# Patient Record
Sex: Female | Born: 1940 | Race: Black or African American | Hispanic: No | Smoking: Never smoker
Health system: Southern US, Community
[De-identification: ages and names within clinical notes are randomized; demographics above are authoritative.]

## PROBLEM LIST (undated history)

## (undated) ENCOUNTER — Emergency Department (HOSPITAL_COMMUNITY): Admission: EM | Payer: Medicare Other | Source: Home / Self Care

## (undated) DIAGNOSIS — K219 Gastro-esophageal reflux disease without esophagitis: Secondary | ICD-10-CM

## (undated) DIAGNOSIS — I639 Cerebral infarction, unspecified: Secondary | ICD-10-CM

## (undated) DIAGNOSIS — N189 Chronic kidney disease, unspecified: Secondary | ICD-10-CM

## (undated) DIAGNOSIS — E1122 Type 2 diabetes mellitus with diabetic chronic kidney disease: Secondary | ICD-10-CM

## (undated) DIAGNOSIS — I10 Essential (primary) hypertension: Secondary | ICD-10-CM

## (undated) DIAGNOSIS — Z992 Dependence on renal dialysis: Secondary | ICD-10-CM

## (undated) DIAGNOSIS — R0602 Shortness of breath: Secondary | ICD-10-CM

## (undated) DIAGNOSIS — N289 Disorder of kidney and ureter, unspecified: Secondary | ICD-10-CM

## (undated) DIAGNOSIS — E119 Type 2 diabetes mellitus without complications: Secondary | ICD-10-CM

## (undated) DIAGNOSIS — L988 Other specified disorders of the skin and subcutaneous tissue: Secondary | ICD-10-CM

## (undated) DIAGNOSIS — D649 Anemia, unspecified: Secondary | ICD-10-CM

## (undated) DIAGNOSIS — N186 End stage renal disease: Secondary | ICD-10-CM

## (undated) HISTORY — DX: Chronic kidney disease, unspecified: N18.9

## (undated) HISTORY — DX: Type 2 diabetes mellitus with diabetic chronic kidney disease: N18.6

## (undated) HISTORY — DX: Type 2 diabetes mellitus with diabetic chronic kidney disease: E11.22

---

## 2001-07-16 ENCOUNTER — Ambulatory Visit (HOSPITAL_COMMUNITY): Admission: RE | Admit: 2001-07-16 | Discharge: 2001-07-16 | Payer: Self-pay | Admitting: Family Medicine

## 2001-07-16 ENCOUNTER — Encounter: Payer: Self-pay | Admitting: Family Medicine

## 2002-01-14 ENCOUNTER — Ambulatory Visit (HOSPITAL_COMMUNITY): Admission: RE | Admit: 2002-01-14 | Discharge: 2002-01-14 | Payer: Self-pay | Admitting: Family Medicine

## 2002-01-14 ENCOUNTER — Encounter: Payer: Self-pay | Admitting: Family Medicine

## 2002-12-28 ENCOUNTER — Ambulatory Visit (HOSPITAL_COMMUNITY): Admission: RE | Admit: 2002-12-28 | Discharge: 2002-12-28 | Payer: Self-pay | Admitting: Family Medicine

## 2002-12-28 ENCOUNTER — Encounter: Payer: Self-pay | Admitting: Family Medicine

## 2004-10-14 ENCOUNTER — Ambulatory Visit (HOSPITAL_COMMUNITY): Admission: RE | Admit: 2004-10-14 | Discharge: 2004-10-14 | Payer: Self-pay | Admitting: Family Medicine

## 2005-06-16 ENCOUNTER — Encounter (INDEPENDENT_AMBULATORY_CARE_PROVIDER_SITE_OTHER): Payer: Self-pay | Admitting: Internal Medicine

## 2005-07-01 ENCOUNTER — Encounter (INDEPENDENT_AMBULATORY_CARE_PROVIDER_SITE_OTHER): Payer: Self-pay | Admitting: Internal Medicine

## 2005-07-09 ENCOUNTER — Encounter (INDEPENDENT_AMBULATORY_CARE_PROVIDER_SITE_OTHER): Payer: Self-pay | Admitting: Internal Medicine

## 2005-09-14 ENCOUNTER — Encounter (INDEPENDENT_AMBULATORY_CARE_PROVIDER_SITE_OTHER): Payer: Self-pay | Admitting: Internal Medicine

## 2005-09-14 LAB — CONVERTED CEMR LAB: Hgb A1c MFr Bld: 10 %

## 2005-10-01 ENCOUNTER — Encounter (INDEPENDENT_AMBULATORY_CARE_PROVIDER_SITE_OTHER): Payer: Self-pay | Admitting: Internal Medicine

## 2005-10-16 ENCOUNTER — Ambulatory Visit (HOSPITAL_COMMUNITY): Admission: RE | Admit: 2005-10-16 | Discharge: 2005-10-16 | Payer: Self-pay | Admitting: Family Medicine

## 2005-11-06 ENCOUNTER — Ambulatory Visit (HOSPITAL_COMMUNITY): Admission: RE | Admit: 2005-11-06 | Discharge: 2005-11-06 | Payer: Self-pay | Admitting: General Surgery

## 2005-11-06 ENCOUNTER — Encounter (INDEPENDENT_AMBULATORY_CARE_PROVIDER_SITE_OTHER): Payer: Self-pay | Admitting: Internal Medicine

## 2005-11-12 ENCOUNTER — Encounter (INDEPENDENT_AMBULATORY_CARE_PROVIDER_SITE_OTHER): Payer: Self-pay | Admitting: Internal Medicine

## 2006-01-14 ENCOUNTER — Encounter (INDEPENDENT_AMBULATORY_CARE_PROVIDER_SITE_OTHER): Payer: Self-pay | Admitting: Internal Medicine

## 2006-01-14 LAB — CONVERTED CEMR LAB: Hgb A1c MFr Bld: 9.7 %

## 2006-01-29 ENCOUNTER — Ambulatory Visit: Payer: Self-pay | Admitting: Internal Medicine

## 2006-02-12 ENCOUNTER — Ambulatory Visit: Payer: Self-pay | Admitting: Internal Medicine

## 2006-02-12 LAB — CONVERTED CEMR LAB
AST: 15 units/L
Alkaline Phosphatase: 60 units/L
BUN: 14 mg/dL
Creatinine, Ser: 0.91 mg/dL
Glucose, Bld: 152 mg/dL
Sodium: 143 meq/L
Total Bilirubin: 0.5 mg/dL
Total CHOL/HDL Ratio: 3.9
Total Protein: 7.3 g/dL
VLDL: 30 mg/dL

## 2006-02-13 ENCOUNTER — Encounter (INDEPENDENT_AMBULATORY_CARE_PROVIDER_SITE_OTHER): Payer: Self-pay | Admitting: Internal Medicine

## 2006-02-17 ENCOUNTER — Telehealth (INDEPENDENT_AMBULATORY_CARE_PROVIDER_SITE_OTHER): Payer: Self-pay | Admitting: Internal Medicine

## 2006-02-18 ENCOUNTER — Ambulatory Visit (HOSPITAL_COMMUNITY): Admission: RE | Admit: 2006-02-18 | Discharge: 2006-02-18 | Payer: Self-pay | Admitting: Internal Medicine

## 2006-02-25 ENCOUNTER — Ambulatory Visit: Payer: Self-pay | Admitting: Internal Medicine

## 2006-02-25 LAB — CONVERTED CEMR LAB: Vitamin B-12: 376 pg/mL

## 2006-02-26 ENCOUNTER — Encounter (INDEPENDENT_AMBULATORY_CARE_PROVIDER_SITE_OTHER): Payer: Self-pay | Admitting: Internal Medicine

## 2006-03-18 ENCOUNTER — Ambulatory Visit: Payer: Self-pay | Admitting: Internal Medicine

## 2006-03-20 ENCOUNTER — Encounter (INDEPENDENT_AMBULATORY_CARE_PROVIDER_SITE_OTHER): Payer: Self-pay | Admitting: Internal Medicine

## 2006-04-16 ENCOUNTER — Encounter (INDEPENDENT_AMBULATORY_CARE_PROVIDER_SITE_OTHER): Payer: Self-pay | Admitting: Internal Medicine

## 2006-04-29 ENCOUNTER — Ambulatory Visit: Payer: Self-pay | Admitting: Internal Medicine

## 2006-05-15 ENCOUNTER — Encounter (INDEPENDENT_AMBULATORY_CARE_PROVIDER_SITE_OTHER): Payer: Self-pay | Admitting: Internal Medicine

## 2006-05-20 ENCOUNTER — Encounter: Payer: Self-pay | Admitting: Internal Medicine

## 2006-05-20 DIAGNOSIS — E119 Type 2 diabetes mellitus without complications: Secondary | ICD-10-CM | POA: Insufficient documentation

## 2006-05-20 DIAGNOSIS — G2581 Restless legs syndrome: Secondary | ICD-10-CM | POA: Insufficient documentation

## 2006-05-20 DIAGNOSIS — G609 Hereditary and idiopathic neuropathy, unspecified: Secondary | ICD-10-CM | POA: Insufficient documentation

## 2006-05-20 DIAGNOSIS — E785 Hyperlipidemia, unspecified: Secondary | ICD-10-CM

## 2006-05-20 DIAGNOSIS — I10 Essential (primary) hypertension: Secondary | ICD-10-CM | POA: Insufficient documentation

## 2006-10-15 ENCOUNTER — Encounter (INDEPENDENT_AMBULATORY_CARE_PROVIDER_SITE_OTHER): Payer: Self-pay | Admitting: Internal Medicine

## 2006-10-16 ENCOUNTER — Ambulatory Visit: Payer: Self-pay | Admitting: Internal Medicine

## 2006-10-16 DIAGNOSIS — I498 Other specified cardiac arrhythmias: Secondary | ICD-10-CM | POA: Insufficient documentation

## 2006-10-16 LAB — CONVERTED CEMR LAB
Blood Glucose, Fingerstick: 111
Hgb A1c MFr Bld: >14 %

## 2006-10-19 ENCOUNTER — Encounter (INDEPENDENT_AMBULATORY_CARE_PROVIDER_SITE_OTHER): Payer: Self-pay | Admitting: Internal Medicine

## 2006-10-20 LAB — CONVERTED CEMR LAB
Albumin: 3.7 g/dL (ref 3.5–5.2)
Alkaline Phosphatase: 66 units/L (ref 39–117)
Basophils Absolute: 0 10*3/uL (ref 0.0–0.1)
Basophils Relative: 0 % (ref 0–1)
Calcium: 8.9 mg/dL (ref 8.4–10.5)
Creatinine, Ser: 1 mg/dL (ref 0.40–1.20)
Eosinophils Absolute: 0.1 10*3/uL (ref 0.0–0.7)
Eosinophils Relative: 1 % (ref 0–5)
Glucose, Bld: 57 mg/dL — ABNORMAL LOW (ref 70–99)
HCT: 36.4 % (ref 36.0–46.0)
HDL: 45 mg/dL (ref >39)
Hgb A1c MFr Bld: 13.7 % — ABNORMAL HIGH (ref 4.6–6.1)
LDL Cholesterol: 149 mg/dL — ABNORMAL HIGH (ref 0–99)
Lymphocytes Relative: 26 % (ref 12–46)
Lymphs Abs: 3.7 10*3/uL — ABNORMAL HIGH (ref 0.7–3.3)
Monocytes Absolute: 1.5 10*3/uL — ABNORMAL HIGH (ref 0.2–0.7)
Neutrophils Relative %: 63 % (ref 43–77)
Potassium: 3.6 meq/L (ref 3.5–5.3)
RDW: 14 % (ref 11.5–14.0)
TSH: 1.48 microintl units/mL (ref 0.350–5.50)
Total CHOL/HDL Ratio: 5
Total Protein: 7.2 g/dL (ref 6.0–8.3)
Triglycerides: 151 mg/dL — ABNORMAL HIGH (ref <150)

## 2006-11-19 ENCOUNTER — Ambulatory Visit: Payer: Self-pay | Admitting: Internal Medicine

## 2006-11-19 DIAGNOSIS — R609 Edema, unspecified: Secondary | ICD-10-CM | POA: Insufficient documentation

## 2006-11-19 DIAGNOSIS — D72829 Elevated white blood cell count, unspecified: Secondary | ICD-10-CM | POA: Insufficient documentation

## 2006-11-19 DIAGNOSIS — E1149 Type 2 diabetes mellitus with other diabetic neurological complication: Secondary | ICD-10-CM | POA: Insufficient documentation

## 2006-12-10 ENCOUNTER — Encounter (INDEPENDENT_AMBULATORY_CARE_PROVIDER_SITE_OTHER): Payer: Self-pay | Admitting: Internal Medicine

## 2007-01-05 ENCOUNTER — Ambulatory Visit (HOSPITAL_COMMUNITY): Admission: RE | Admit: 2007-01-05 | Discharge: 2007-01-05 | Payer: Self-pay | Admitting: Family Medicine

## 2007-09-10 ENCOUNTER — Encounter (INDEPENDENT_AMBULATORY_CARE_PROVIDER_SITE_OTHER): Payer: Self-pay | Admitting: Internal Medicine

## 2007-10-15 ENCOUNTER — Ambulatory Visit (HOSPITAL_COMMUNITY): Admission: RE | Admit: 2007-10-15 | Discharge: 2007-10-15 | Payer: Self-pay | Admitting: Family Medicine

## 2008-06-13 ENCOUNTER — Encounter (INDEPENDENT_AMBULATORY_CARE_PROVIDER_SITE_OTHER): Payer: Self-pay | Admitting: Internal Medicine

## 2008-06-14 ENCOUNTER — Encounter (INDEPENDENT_AMBULATORY_CARE_PROVIDER_SITE_OTHER): Payer: Self-pay | Admitting: Internal Medicine

## 2009-01-24 ENCOUNTER — Inpatient Hospital Stay (HOSPITAL_COMMUNITY): Admission: EM | Admit: 2009-01-24 | Discharge: 2009-01-27 | Payer: Self-pay | Admitting: Emergency Medicine

## 2009-03-17 ENCOUNTER — Emergency Department (HOSPITAL_COMMUNITY): Admission: EM | Admit: 2009-03-17 | Discharge: 2009-03-18 | Payer: Self-pay | Admitting: Emergency Medicine

## 2009-08-02 ENCOUNTER — Ambulatory Visit (HOSPITAL_COMMUNITY): Admission: RE | Admit: 2009-08-02 | Discharge: 2009-08-02 | Payer: Self-pay | Admitting: Family Medicine

## 2009-08-16 ENCOUNTER — Emergency Department (HOSPITAL_COMMUNITY): Admission: EM | Admit: 2009-08-16 | Discharge: 2009-08-16 | Payer: Self-pay | Admitting: Emergency Medicine

## 2009-11-03 ENCOUNTER — Emergency Department (HOSPITAL_COMMUNITY): Admission: EM | Admit: 2009-11-03 | Discharge: 2009-11-04 | Payer: Self-pay | Admitting: Emergency Medicine

## 2009-11-15 ENCOUNTER — Encounter: Admission: RE | Admit: 2009-11-15 | Discharge: 2009-11-15 | Payer: Self-pay | Admitting: Neurology

## 2010-07-07 ENCOUNTER — Encounter: Payer: Self-pay | Admitting: Family Medicine

## 2010-07-14 LAB — CONVERTED CEMR LAB: TSH: 1.821 microintl units/mL

## 2010-08-20 ENCOUNTER — Other Ambulatory Visit (HOSPITAL_COMMUNITY): Payer: Self-pay | Admitting: Family Medicine

## 2010-08-20 DIAGNOSIS — Z139 Encounter for screening, unspecified: Secondary | ICD-10-CM

## 2010-08-22 ENCOUNTER — Ambulatory Visit (HOSPITAL_COMMUNITY)
Admission: RE | Admit: 2010-08-22 | Discharge: 2010-08-22 | Disposition: A | Discharge status: Home / Self Care | Payer: Medicare Other | Source: Ambulatory Visit | Attending: Family Medicine | Admitting: Family Medicine

## 2010-08-22 DIAGNOSIS — Z139 Encounter for screening, unspecified: Secondary | ICD-10-CM

## 2010-08-22 DIAGNOSIS — Z1231 Encounter for screening mammogram for malignant neoplasm of breast: Secondary | ICD-10-CM | POA: Insufficient documentation

## 2010-09-02 LAB — DIFFERENTIAL
Basophils Absolute: 0 10*3/uL (ref 0.0–0.1)
Basophils Relative: 0 % (ref 0–1)
Eosinophils Absolute: 0.1 10*3/uL (ref 0.0–0.7)
Neutro Abs: 9.9 10*3/uL — ABNORMAL HIGH (ref 1.7–7.7)
Neutrophils Relative %: 72 % (ref 43–77)

## 2010-09-02 LAB — POCT I-STAT, CHEM 8
Chloride: 107 mEq/L (ref 96–112)
HCT: 37 % (ref 36.0–46.0)
Hemoglobin: 12.6 g/dL (ref 12.0–15.0)
Potassium: 3.4 mEq/L — ABNORMAL LOW (ref 3.5–5.1)

## 2010-09-02 LAB — GLUCOSE, CAPILLARY: Glucose-Capillary: 143 mg/dL — ABNORMAL HIGH (ref 70–99)

## 2010-09-02 LAB — CBC
MCHC: 34 g/dL (ref 30.0–36.0)
Platelets: 189 10*3/uL (ref 150–400)
RDW: 12.6 % (ref 11.5–15.5)

## 2010-09-02 LAB — CK: Total CK: 210 U/L — ABNORMAL HIGH (ref 7–177)

## 2010-09-09 LAB — POCT CARDIAC MARKERS
CKMB, poc: 2.1 ng/mL (ref 1.0–8.0)
Troponin i, poc: <0.05 ng/mL (ref 0.00–0.09)

## 2010-09-09 LAB — BASIC METABOLIC PANEL
BUN: 21 mg/dL (ref 6–23)
Calcium: 9.5 mg/dL (ref 8.4–10.5)
GFR calc non Af Amer: 39 mL/min — ABNORMAL LOW (ref >60)
Glucose, Bld: 112 mg/dL — ABNORMAL HIGH (ref 70–99)
Potassium: 3.6 mEq/L (ref 3.5–5.1)
Sodium: 140 mEq/L (ref 135–145)

## 2010-09-09 LAB — DIFFERENTIAL
Basophils Absolute: 0 10*3/uL (ref 0.0–0.1)
Eosinophils Relative: 0 % (ref 0–5)
Lymphocytes Relative: 14 % (ref 12–46)
Lymphs Abs: 2 10*3/uL (ref 0.7–4.0)
Neutro Abs: 12.1 10*3/uL — ABNORMAL HIGH (ref 1.7–7.7)

## 2010-09-09 LAB — CBC
HCT: 34.9 % — ABNORMAL LOW (ref 36.0–46.0)
Hemoglobin: 11.7 g/dL — ABNORMAL LOW (ref 12.0–15.0)
Platelets: 212 10*3/uL (ref 150–400)
RDW: 13.2 % (ref 11.5–15.5)
WBC: 15.1 10*3/uL — ABNORMAL HIGH (ref 4.0–10.5)

## 2010-09-09 LAB — GLUCOSE, CAPILLARY: Glucose-Capillary: 101 mg/dL — ABNORMAL HIGH (ref 70–99)

## 2010-09-19 LAB — CBC
HCT: 33.2 % — ABNORMAL LOW (ref 36.0–46.0)
Hemoglobin: 11 g/dL — ABNORMAL LOW (ref 12.0–15.0)
MCHC: 33.2 g/dL (ref 30.0–36.0)
MCV: 90.1 fL (ref 78.0–100.0)
Platelets: 210 10*3/uL (ref 150–400)
RBC: 3.68 MIL/uL — ABNORMAL LOW (ref 3.87–5.11)
RDW: 13.6 % (ref 11.5–15.5)
WBC: 11.7 10*3/uL — ABNORMAL HIGH (ref 4.0–10.5)

## 2010-09-19 LAB — BASIC METABOLIC PANEL
BUN: 19 mg/dL (ref 6–23)
CO2: 29 mEq/L (ref 19–32)
Chloride: 103 mEq/L (ref 96–112)
Creatinine, Ser: 1.4 mg/dL — ABNORMAL HIGH (ref 0.4–1.2)
Glucose, Bld: 239 mg/dL — ABNORMAL HIGH (ref 70–99)
Potassium: 3.4 mEq/L — ABNORMAL LOW (ref 3.5–5.1)

## 2010-09-19 LAB — DIFFERENTIAL
Basophils Relative: 0 % (ref 0–1)
Eosinophils Absolute: 0.2 10*3/uL (ref 0.0–0.7)
Eosinophils Relative: 1 % (ref 0–5)
Lymphs Abs: 2.4 10*3/uL (ref 0.7–4.0)
Neutrophils Relative %: 70 % (ref 43–77)

## 2010-09-19 LAB — URINE CULTURE: Colony Count: 55000

## 2010-09-19 LAB — URINALYSIS, ROUTINE W REFLEX MICROSCOPIC
Bilirubin Urine: NEGATIVE
Glucose, UA: 500 mg/dL — AB
Ketones, ur: NEGATIVE mg/dL
Leukocytes, UA: NEGATIVE
Nitrite: NEGATIVE
Protein, ur: >300 mg/dL — AB
Specific Gravity, Urine: 1.025 (ref 1.005–1.030)
Urobilinogen, UA: 0.2 mg/dL (ref 0.0–1.0)
pH: 6 (ref 5.0–8.0)

## 2010-09-19 LAB — GLUCOSE, CAPILLARY: Glucose-Capillary: 202 mg/dL — ABNORMAL HIGH (ref 70–99)

## 2010-09-19 LAB — URINE MICROSCOPIC-ADD ON

## 2010-09-22 LAB — GLUCOSE, CAPILLARY
Glucose-Capillary: 217 mg/dL — ABNORMAL HIGH (ref 70–99)
Glucose-Capillary: 224 mg/dL — ABNORMAL HIGH (ref 70–99)
Glucose-Capillary: 265 mg/dL — ABNORMAL HIGH (ref 70–99)
Glucose-Capillary: 270 mg/dL — ABNORMAL HIGH (ref 70–99)
Glucose-Capillary: 320 mg/dL — ABNORMAL HIGH (ref 70–99)
Glucose-Capillary: 363 mg/dL — ABNORMAL HIGH (ref 70–99)

## 2010-09-22 LAB — DIFFERENTIAL
Basophils Absolute: 0 10*3/uL (ref 0.0–0.1)
Basophils Absolute: 0 10*3/uL (ref 0.0–0.1)
Basophils Absolute: 0 10*3/uL (ref 0.0–0.1)
Basophils Relative: 0 % (ref 0–1)
Basophils Relative: 0 % (ref 0–1)
Basophils Relative: 0 % (ref 0–1)
Eosinophils Absolute: 0 10*3/uL (ref 0.0–0.7)
Eosinophils Absolute: 0 10*3/uL (ref 0.0–0.7)
Eosinophils Absolute: 0.1 10*3/uL (ref 0.0–0.7)
Eosinophils Relative: 0 % (ref 0–5)
Eosinophils Relative: 1 % (ref 0–5)
Lymphocytes Relative: 18 % (ref 12–46)
Lymphocytes Relative: 27 % (ref 12–46)
Lymphs Abs: 3.5 10*3/uL (ref 0.7–4.0)
Monocytes Absolute: 0.7 10*3/uL (ref 0.1–1.0)
Monocytes Relative: 2 % — ABNORMAL LOW (ref 3–12)
Monocytes Relative: 5 % (ref 3–12)
Neutro Abs: 11.6 10*3/uL — ABNORMAL HIGH (ref 1.7–7.7)
Neutro Abs: 12.4 10*3/uL — ABNORMAL HIGH (ref 1.7–7.7)
Neutro Abs: 8.4 10*3/uL — ABNORMAL HIGH (ref 1.7–7.7)
Neutrophils Relative %: 66 % (ref 43–77)
Neutrophils Relative %: 82 % — ABNORMAL HIGH (ref 43–77)
Neutrophils Relative %: 90 % — ABNORMAL HIGH (ref 43–77)

## 2010-09-22 LAB — MAGNESIUM: Magnesium: 1.7 mg/dL (ref 1.5–2.5)

## 2010-09-22 LAB — URINE MICROSCOPIC-ADD ON

## 2010-09-22 LAB — CBC
Hemoglobin: 10.8 g/dL — ABNORMAL LOW (ref 12.0–15.0)
Hemoglobin: 11.2 g/dL — ABNORMAL LOW (ref 12.0–15.0)
MCHC: 34.1 g/dL (ref 30.0–36.0)
MCHC: 34.2 g/dL (ref 30.0–36.0)
MCV: 89 fL (ref 78.0–100.0)
MCV: 89.7 fL (ref 78.0–100.0)
Platelets: 191 10*3/uL (ref 150–400)
Platelets: 197 10*3/uL (ref 150–400)
RBC: 3.57 MIL/uL — ABNORMAL LOW (ref 3.87–5.11)
RDW: 13.8 % (ref 11.5–15.5)
RDW: 13.9 % (ref 11.5–15.5)
RDW: 14.4 % (ref 11.5–15.5)
WBC: 12.8 10*3/uL — ABNORMAL HIGH (ref 4.0–10.5)

## 2010-09-22 LAB — URINALYSIS, ROUTINE W REFLEX MICROSCOPIC
Bilirubin Urine: NEGATIVE
Glucose, UA: >1000 mg/dL — AB
Ketones, ur: NEGATIVE mg/dL
Leukocytes, UA: NEGATIVE
pH: 6 (ref 5.0–8.0)

## 2010-09-22 LAB — LIPID PANEL: Cholesterol: 166 mg/dL (ref 0–200)

## 2010-09-22 LAB — COMPREHENSIVE METABOLIC PANEL
ALT: 13 U/L (ref 0–35)
ALT: 15 U/L (ref 0–35)
AST: 17 U/L (ref 0–37)
AST: 25 U/L (ref 0–37)
Albumin: 3.2 g/dL — ABNORMAL LOW (ref 3.5–5.2)
Alkaline Phosphatase: 66 U/L (ref 39–117)
Alkaline Phosphatase: 66 U/L (ref 39–117)
BUN: 17 mg/dL (ref 6–23)
CO2: 22 mEq/L (ref 19–32)
CO2: 24 mEq/L (ref 19–32)
Calcium: 8.9 mg/dL (ref 8.4–10.5)
Chloride: 109 mEq/L (ref 96–112)
Creatinine, Ser: 0.98 mg/dL (ref 0.4–1.2)
GFR calc Af Amer: 51 mL/min — ABNORMAL LOW (ref >60)
GFR calc Af Amer: >60 mL/min (ref >60)
GFR calc non Af Amer: 56 mL/min — ABNORMAL LOW (ref >60)
Glucose, Bld: 298 mg/dL — ABNORMAL HIGH (ref 70–99)
Glucose, Bld: 412 mg/dL — ABNORMAL HIGH (ref 70–99)
Potassium: 3.2 mEq/L — ABNORMAL LOW (ref 3.5–5.1)
Potassium: 5.1 mEq/L (ref 3.5–5.1)
Sodium: 140 mEq/L (ref 135–145)
Sodium: 140 mEq/L (ref 135–145)
Total Bilirubin: 0.6 mg/dL (ref 0.3–1.2)
Total Protein: 6.1 g/dL (ref 6.0–8.3)
Total Protein: 7.3 g/dL (ref 6.0–8.3)

## 2010-09-22 LAB — BASIC METABOLIC PANEL
BUN: 15 mg/dL (ref 6–23)
BUN: 20 mg/dL (ref 6–23)
CO2: 20 mEq/L (ref 19–32)
Calcium: 8.3 mg/dL — ABNORMAL LOW (ref 8.4–10.5)
Calcium: 8.4 mg/dL (ref 8.4–10.5)
Chloride: 112 mEq/L (ref 96–112)
Creatinine, Ser: 1.1 mg/dL (ref 0.4–1.2)
GFR calc non Af Amer: 47 mL/min — ABNORMAL LOW (ref >60)
Glucose, Bld: 288 mg/dL — ABNORMAL HIGH (ref 70–99)
Glucose, Bld: 313 mg/dL — ABNORMAL HIGH (ref 70–99)
Sodium: 139 mEq/L (ref 135–145)

## 2010-09-22 LAB — HEMOGLOBIN A1C
Hgb A1c MFr Bld: 12.3 % — ABNORMAL HIGH (ref 4.6–6.1)
Mean Plasma Glucose: 306 mg/dL

## 2010-10-12 ENCOUNTER — Emergency Department (HOSPITAL_COMMUNITY)
Admission: EM | Admit: 2010-10-12 | Discharge: 2010-10-12 | Disposition: A | Discharge status: Home / Self Care | Payer: Medicare Other | Attending: Emergency Medicine | Admitting: Emergency Medicine

## 2010-10-12 DIAGNOSIS — E785 Hyperlipidemia, unspecified: Secondary | ICD-10-CM | POA: Insufficient documentation

## 2010-10-12 DIAGNOSIS — I10 Essential (primary) hypertension: Secondary | ICD-10-CM | POA: Insufficient documentation

## 2010-10-12 DIAGNOSIS — R110 Nausea: Secondary | ICD-10-CM | POA: Insufficient documentation

## 2010-10-12 DIAGNOSIS — R42 Dizziness and giddiness: Secondary | ICD-10-CM | POA: Insufficient documentation

## 2010-10-12 DIAGNOSIS — E1169 Type 2 diabetes mellitus with other specified complication: Secondary | ICD-10-CM | POA: Insufficient documentation

## 2010-10-12 LAB — CBC
HCT: 32.6 % — ABNORMAL LOW (ref 36.0–46.0)
Hemoglobin: 10.7 g/dL — ABNORMAL LOW (ref 12.0–15.0)
MCHC: 32.8 g/dL (ref 30.0–36.0)

## 2010-10-12 LAB — BASIC METABOLIC PANEL
BUN: 25 mg/dL — ABNORMAL HIGH (ref 6–23)
CO2: 26 mEq/L (ref 19–32)
Calcium: 9.4 mg/dL (ref 8.4–10.5)
Creatinine, Ser: 1.77 mg/dL — ABNORMAL HIGH (ref 0.4–1.2)
Glucose, Bld: 350 mg/dL — ABNORMAL HIGH (ref 70–99)

## 2010-10-12 LAB — DIFFERENTIAL
Basophils Absolute: 0 10*3/uL (ref 0.0–0.1)
Lymphocytes Relative: 17 % (ref 12–46)
Monocytes Absolute: 1 10*3/uL (ref 0.1–1.0)
Monocytes Relative: 7 % (ref 3–12)
Neutro Abs: 11.9 10*3/uL — ABNORMAL HIGH (ref 1.7–7.7)

## 2010-10-29 NOTE — H&P (Signed)
[name], [name]            ACCOUNT NO.:  [id number]   MEDICAL RECORD NO.:  [id number]          PATIENT TYPE:  INP   LOCATION:  A307                          FACILITY:  [facility]   PHYSICIAN:  Corinna L. Conley Canal, MDDATE OF BIRTH:  07/28/1940   DATE OF ADMISSION:  01/24/2009  DATE OF DISCHARGE:  LH                              HISTORY & PHYSICAL   CHIEF COMPLAINT:  Dizziness, vomiting.   HISTORY OF PRESENT ILLNESS:  [name] is a pleasant 70 year old  black female patient of Dr. Berdine Howard with multiple medical problems, who  woke up this morning with severe dizziness, nausea, ataxia.  She fell.  She has never experienced this before.  She has no tinnitus.  She has no  other symptoms.  She has received meclizine, several doses of Zofran,  Ativan, Reglan, morphine. She feels a bit better, but is not back to her  baseline.  She had a CT scan of the head, abdomen, and pelvis which  showed nothing other than cholelithiasis without cholecystitis.  She has  had no history of stroke.  She has a history of uncontrolled diabetes.  She drank a few sips of ginger ale and was unable to keep it down.  She  has had no hematemesis.   PAST MEDICAL HISTORY:  1. Diabetes, uncontrolled:  Blood sugars usually around 200-300 or      higher.  2. Hypertension.  3. Hyperlipidemia.   MEDICATIONS:  1. Tekturna 300 mg a day.  2. Vytorin 10/40 mg a day.  3. Norvasc 5 mg a day.  4. Diovan HCT 12.5 mg a day.  5. 70/30 insulin, 56 units in the morning and 44 units in the evening.  6. aspirin daily, but frequently forgets.   ALLERGIES:  No known drug allergies.   SOCIAL HISTORY:  The patient does not have a drinking, smoking, or drug  history.  She is here with her husband.  Her daughter assists in her  medical care.   FAMILY HISTORY:  Her sister had breast cancer.   REVIEW OF SYSTEMS:  As above, otherwise negative.   PHYSICAL EXAMINATION:  VITAL SIGNS:  Temperature is 97.5, blood pressure  174/98, pulse 83, respiratory rate 20, oxygen saturation 100% on room  air.  GENERAL:  The patient is an overweight, black female, in no acute  distress.  HEENT:  Normocephalic, atraumatic.  Pupils equal, round, and reactive to  light.  Sclerae nonicteric.  Moist mucous membranes.  NECK:  Supple.  No carotid bruits.  LUNGS:  Clear to auscultation bilaterally without wheezes, rhonchi, or  rales.  CARDIOVASCULAR:  Regular rate and rhythm without murmurs, gallops, or  rubs.  ABDOMEN:  Normal bowel sounds.  Soft, nontender, nondistended.  GU:  Deferred.  RECTAL:  Deferred.  EXTREMITIES:  No clubbing, cyanosis, or edema.  SKIN:  No rash.  NEUROLOGIC:  Alert and oriented x3.  Cranial nerves were intact.  Motor  strength is 5/5.  Deep tendon reflexes are diminished.  Babinski is  equivocal.  Finger-to-nose normal.  Did not test Romberg or gait due to  symptoms.  No nystagmus.  PSYCHIATRIC:  Normal affect.  Calm and cooperative.   LABORATORY DATA:  White blood cell count is 12,000, hemoglobin 11.6,  hematocrit 33.9, platelet count 191, normal differential.  Complete  metabolic panel significant for a potassium of 3.2, glucose 298,  otherwise unremarkable.  Urinalysis shows a specific gravity 1.025,  urine glucose greater than 1000, negative ketones, small blood, greater  than 300 protein, negative nitrate, negative leukocyte esterase, 7-10  white cells, few bacteria.  CT of the abdomen and pelvis shows  cholelithiasis without evidence of cholecystitis.  CT brain without  contrast shows nothing acute.   ASSESSMENT AND PLAN:  1. Vertigo, suspect acute labyrinthitis, but given her risk factors,      we will check an MRI to rule out posterior circulation stroke.  She      will get meclizine and antiemetics.  Consider scopolamine.  She      will get a physical therapy or occupational therapy.  Her symptoms      are somewhat improved and I will place her on 23-hour observation      on  telemetry.  2. Uncontrolled diabetes:  For now give 20 units subcutaneously, 70/30      insulin twice a day, also give sliding scale.  It sounds as if her      diabetes is uncontrolled and we may need to up-titrate her home      70/30 insulin dose.  Check hemoglobin Alc.  3. Uncontrolled hypertension:  The patient reports she has not taken      any of her medications today which I will give now.  4. Hyperlipidemia:  Hold Vytorin for now.  Check fasting lipids in the      morning.  5. Hypokalemia.  This will be repleted IV and by mouth. Check again in      the morning as well as magnesium.  6. Mild leukocytosis, suspect demargination, but check another CBC in      the morning.  7. Status post fall secondary to above:  Follow precautions.  8. Asymptomatic cholelithiasis.      Corinna L. Conley Canal, MD  Electronically Signed     CLS/MEDQ  D:  01/24/2009  T:  01/25/2009  Job:  [id number]   cc:   Barbara Folk. Berdine Addison, MD  Fax: [phone]

## 2010-10-29 NOTE — Discharge Summary (Signed)
[name], AMYX NO.:  [id number]   MEDICAL RECORD NO.:  [id number]          PATIENT TYPE:  INP   LOCATION:  A307                          FACILITY:  [facility]   PHYSICIAN:  Bonnielee Haff, MD     DATE OF BIRTH:  06/29/1940   DATE OF ADMISSION:  01/24/2009  DATE OF DISCHARGE:  08/14/2010LH                               DISCHARGE SUMMARY   The patient's PMD is Dr. [name].   No consultations obtained during this admission.   Please review H and P dictated by Dr. Doree Barthel for details  regarding the patient's presenting illness.   DISCHARGE DIAGNOSES:  1. Benign positional paroxysmal vertigo likely secondary to acute      labyrinthitis, improved.  2. Poorly-controlled diabetes requiring outpatient followup.  3. Poorly-controlled hypertension requiring outpatient followup.  4. History of dyslipidemia.   BRIEF HOSPITAL COURSE:  1. Briefly, this is a 70 year old African American female, who      presented to the hospital with dizziness and vomiting.  She was      describing these episodes as a spinning sensation of the head.  The      room was spinning around her.  The symptoms were suggestive of      vertigo.  She underwent CT scan of the abdomen and pelvis, which      did not show any acute abnormalities.  Subsequently, she underwent      MRI of the brain because of her risk factors and to rule out a      cerebellar stroke and this was negative for any acute infarcts.      Moderate white matter disease was noted.  Minimal left sphenoid      sinus disease was noted.  The patient was started on meclizine, she      was very slow to improve, we had to add Valium and steroids.  We      also started her on intravenous antibiotics for her labyrinthitis.      Over the past 36 hours, she has shown significant improvement.  She      ambulated yesterday with no difficulty and has not experienced any      more of this  spinning sensation.  2. Fall.  While she  was being evaluated by the physical therapist, the      patient had another episode of spinning and she actually fell down      on the left side of her face.  This resulted in a hematoma.  CT of      the maxillofacial area did not show any acute bony fractures.  The      hematoma was self-limiting.  3. She also has history of diabetes which is very, very poorly      controlled.  Her HBA1c came back at 12.3.  Since we gave her      steroids her sugar went up again.  We will give her one dose of      Lantus today and resume her usual dose of insulin 70/30 at home,      and with  the tapering of steroids, her blood sugar should come back      to her baseline which also appears not to be very well controlled      and so this is something she needs to discuss with her PMD.  4. Hypertension was also not optimally controlled.  Blood pressure      here 156/88, it has been fluctuating quite a bid.  She needs to      restart her home medicines and then talk about this with Dr. Berdine Addison.   Regarding her vomiting, this was secondary to the vertigo.  She did  undergo a CT abdomen and pelvis in the ED, which showed cholelithiasis  without cholecystitis and no other acute findings in the pelvis.   Otherwise, this morning, she feels well.  Denies any complaints.  No  further episodes of vertigo.  She is afebrile.  Heart rate 84,  respiratory rate 20, blood pressure 156/88, and saturation 97% on room  air.  Lungs are clear to auscultation.  Cardiovascular exam is  unremarkable.  Left side of the face shows improving swelling, which is  decreasing in size.   Labs this morning show normal potassium, normal renal function, blood  sugar is 313.  White count is high at 15, which is likely because of  steroids.  Hemoglobin is stable.  Platelet count is stable.   RECOMMENDATIONS:  1. Recommendations to the PMD include followup on cholelithiasis, keep      that in mind if she develops right upper quadrant pain  or      abdominal pain, she could be having cholecystitis.  2. To improve glycemic control by adjustment of her insulin regimen.  3. Adjust blood pressure medicines as needed.  Her blood pressure      could be high because of the steroids as well.   DISCHARGE MEDICATIONS:  1. Valium 2 mg t.i.d. for 5 days.  2. Meclizine 25 mg t.i.d. for 5 days.  3. Prednisone 20 mg daily for 3 days, followed by 10 mg daily for 3      days and then stop.  4. Levaquin 500 mg once daily for 5 days.   Continue with home medicines which include:  1. Tylenol 300 mg once daily.  2. Vytorin 10/40 once daily.  3. Norvasc 5 mg once daily.  4. Diovan HCT 160/12.5 once daily.  5. Insulin 70/30 56 units in the morning and 44 units at night.  6. Aspirin 81 mg daily.   Follow up with Dr. Berdine Addison.  She has an appointment on August 16, this  coming Monday.  Dr. Berdine Addison to consider an appointment to ENT as the  patient does have symptoms of tinnitus and hearing impairment of the  left ear.   Other instructions to the patient include no sudden head movements, no  driving till cleared by Dr. Berdine Addison, and to take it easy for the next few  days.   DIET:  Heart healthy and modified carbohydrate.   PHYSICAL ACTIVITY:  Return to activity slowly with the above  restrictions.   Total time on this encounter 35 minutes.      Bonnielee Haff, MD  Electronically Signed    GK/MEDQ  D:  01/27/2009  T:  01/27/2009  Job:  [id number]   cc:   Barrie Folk. Berdine Addison, MD  Fax: [phone]

## 2010-11-01 NOTE — H&P (Signed)
NAME:  [name], [name]            ACCOUNT NO.:  [id number]   MEDICAL RECORD NO.:  [id number]          PATIENT TYPE:  AMB   LOCATION:  DAY                           FACILITY:  [facility]   PHYSICIAN:  Leane Para C. Tamala Julian, M.D.   DATE OF BIRTH:  18-Feb-1941   DATE OF ADMISSION:  11/05/2005  DATE OF DISCHARGE:  LH                                HISTORY & PHYSICAL   HISTORY OF PRESENT ILLNESS:  A 70 year old female referred for her initial  screening colonoscopy. No history of rectal bleeding. No family history of  colon cancer or colon polyps.   PAST MEDICAL HISTORY:  Positive for diabetes mellitus, hypertension,  hyperlipidemia, and cholelithiasis.   PAST SURGICAL HISTORY:  Tubal ligation.   MEDICATIONS:  1.  Hydrochlorothiazide 12.5 mg 1/2 tablet daily.  2.  Lotrel 10/20 daily.  3.  Vytorin 10/20 daily.  4.  Actos 45 mg daily.  5.  Novolin 70/30, 50 units in the morning and 40 units in the evening.   PHYSICAL EXAMINATION:  VITAL SIGNS:  Blood pressure 150/82, pulse 92,  respiratory rate 20. Weight 187 pounds.  HEENT:  Unremarkable.  NECK:  Supple. No JVD, bruits, adenopathy, or thyromegaly.  CHEST:  Clear to auscultation.  HEART:  Regular rate and rhythm without murmur, rub, or gallop.  ABDOMEN:  Soft, nontender. No masses.  EXTREMITIES:  No clubbing, cyanosis, or edema.  NEUROLOGIC:  No focal motor, sensory, or cerebellar deficits.   IMPRESSION:  1.  Need for screening colonoscopy.  2.  Diabetes mellitus.  3.  Hypertension.  4.  Asymptomatic cholelithiasis.  5.  Hyperlipidemia.   PLAN:  Screening colonoscopy.      Vernon Prey. Tamala Julian, M.D.  Electronically Signed     LCS/MEDQ  D:  11/05/2005  T:  11/05/2005  Job:  [id number]

## 2011-09-02 ENCOUNTER — Other Ambulatory Visit (HOSPITAL_COMMUNITY): Payer: Self-pay | Admitting: Family Medicine

## 2011-09-02 DIAGNOSIS — Z139 Encounter for screening, unspecified: Secondary | ICD-10-CM

## 2011-09-04 ENCOUNTER — Ambulatory Visit (HOSPITAL_COMMUNITY)
Admission: RE | Admit: 2011-09-04 | Discharge: 2011-09-04 | Disposition: A | Discharge status: Home / Self Care | Payer: Medicare Other | Source: Ambulatory Visit | Attending: Family Medicine | Admitting: Family Medicine

## 2011-09-04 DIAGNOSIS — Z139 Encounter for screening, unspecified: Secondary | ICD-10-CM

## 2011-09-04 DIAGNOSIS — Z1231 Encounter for screening mammogram for malignant neoplasm of breast: Secondary | ICD-10-CM | POA: Insufficient documentation

## 2011-10-15 ENCOUNTER — Other Ambulatory Visit: Payer: Self-pay | Admitting: Adult Health

## 2011-10-15 ENCOUNTER — Other Ambulatory Visit (HOSPITAL_COMMUNITY)
Admission: RE | Admit: 2011-10-15 | Discharge: 2011-10-15 | Disposition: A | Discharge status: Home / Self Care | Payer: Medicare Other | Source: Ambulatory Visit | Attending: Obstetrics and Gynecology | Admitting: Obstetrics and Gynecology

## 2011-10-15 DIAGNOSIS — Z124 Encounter for screening for malignant neoplasm of cervix: Secondary | ICD-10-CM | POA: Insufficient documentation

## 2011-11-12 ENCOUNTER — Encounter (HOSPITAL_COMMUNITY): Payer: Self-pay | Admitting: Pharmacy Technician

## 2011-11-12 ENCOUNTER — Encounter (HOSPITAL_COMMUNITY): Payer: Self-pay

## 2011-11-12 ENCOUNTER — Encounter (HOSPITAL_COMMUNITY)
Admission: RE | Admit: 2011-11-12 | Discharge: 2011-11-12 | Disposition: A | Discharge status: Home / Self Care | Payer: Medicare Other | Source: Ambulatory Visit | Attending: Ophthalmology | Admitting: Ophthalmology

## 2011-11-12 ENCOUNTER — Other Ambulatory Visit: Payer: Self-pay

## 2011-11-12 HISTORY — DX: Essential (primary) hypertension: I10

## 2011-11-12 LAB — BASIC METABOLIC PANEL
CO2: 26 mEq/L (ref 19–32)
Calcium: 9.7 mg/dL (ref 8.4–10.5)
Glucose, Bld: 73 mg/dL (ref 70–99)
Sodium: 140 mEq/L (ref 135–145)

## 2011-11-12 LAB — HEMOGLOBIN AND HEMATOCRIT, BLOOD: HCT: 31.9 % — ABNORMAL LOW (ref 36.0–46.0)

## 2011-11-12 NOTE — Patient Instructions (Addendum)
Your procedure is scheduled on:  Thursday, 11/20/11  Report to [facility] at    1120    AM.  Call this number if you have problems the morning of surgery: [phone]   Remember:   Do not eat or drink   After Midnight.  Take these medicines the morning of surgery with A SIP OF WATER: tribenzor (BP pill)   Do not wear jewelry, make-up or nail polish.  Do not wear lotions, powders, or perfumes. You may wear deodorant.  Do not bring valuables to the hospital.  Contacts, dentures or bridgework may not be worn into surgery.     Patients discharged the day of surgery will not be allowed to drive home.  Name and phone number of your driver: driver  Special Instructions: Use eye drops as directed.   Please read over the following fact sheets that you were given: Pain Booklet, Anesthesia Post-op Instructions and Care and Recovery After Surgery    Cataract Surgery  A cataract is a clouding of the lens of the eye. When a lens becomes cloudy, vision is reduced based on the degree and nature of the clouding. Surgery may be needed to improve vision. Surgery removes the cloudy lens and usually replaces it with a substitute lens (intraocular lens, IOL). LET YOUR EYE DOCTOR KNOW ABOUT:  Allergies to food or medicine.   Medicines taken including herbs, eyedrops, over-the-counter medicines, and creams.   Use of steroids (by mouth or creams).   Previous problems with anesthetics or numbing medicine.   History of bleeding problems or blood clots.   Previous surgery.   Other health problems, including diabetes and kidney problems.   Possibility of pregnancy, if this applies.  RISKS AND COMPLICATIONS  Infection.   Inflammation of the eyeball (endophthalmitis) that can spread to both eyes (sympathetic ophthalmia).   Poor wound healing.   If an IOL is inserted, it can later fall out of proper position. This is very uncommon.   Clouding of the part of your eye that holds an IOL in place. This is  called an "after-cataract." These are uncommon, but easily treated.  BEFORE THE PROCEDURE  Do not eat or drink anything except small amounts of water for 8 to 12 before your surgery, or as directed by your caregiver.   Unless you are told otherwise, continue any eyedrops you have been prescribed.   Talk to your primary caregiver about all other medicines that you take (both prescription and non-prescription). In some cases, you may need to stop or change medicines near the time of your surgery. This is most important if you are taking blood-thinning medicine.Do not stop medicines unless you are told to do so.   Arrange for someone to drive you to and from the procedure.   Do not put contact lenses in either eye on the day of your surgery.  PROCEDURE There is more than one method for safely removing a cataract. Your doctor can explain the differences and help determine which is best for you. Phacoemulsification surgery is the most common form of cataract surgery.  An injection is given behind the eye or eyedrops are given to make this a painless procedure.   A small cut (incision) is made on the edge of the clear, dome-shaped surface that covers the front of the eye (cornea).   A tiny probe is painlessly inserted into the eye. This device gives off ultrasound waves that soften and break up the cloudy center of the lens. This  makes it easier for the cloudy lens to be removed by suction.   An IOL may be implanted.   The normal lens of the eye is covered by a clear capsule. Part of that capsule is intentionally left in the eye to support the IOL.   Your surgeon may or may not use stitches to close the incision.  There are other forms of cataract surgery that require a larger incision and stiches to close the eye. This approach is taken in cases where the doctor feels that the cataract cannot be easily removed using phacoemulsification. AFTER THE PROCEDURE  When an IOL is implanted, it  does not need care. It becomes a permanent part of your eye and cannot be seen or felt.   Your doctor will schedule follow-up exams to check on your progress.   Review your other medicines with your doctor to see which can be resumed after surgery.   Use eyedrops or take medicine as prescribed by your doctor.  Document Released: 05/22/2011 Document Reviewed: 05/19/2011 [facility] Patient Information 2012 [place].  PATIENT INSTRUCTIONS POST-ANESTHESIA  IMMEDIATELY FOLLOWING SURGERY:  Do not drive or operate machinery for the first twenty four hours after surgery.  Do not make any important decisions for twenty four hours after surgery or while taking narcotic pain medications or sedatives.  If you develop intractable nausea and vomiting or a severe headache please notify your doctor immediately.  FOLLOW-UP:  Please make an appointment with your surgeon as instructed. You do not need to follow up with anesthesia unless specifically instructed to do so.  WOUND CARE INSTRUCTIONS (if applicable):  Keep a dry clean dressing on the anesthesia/puncture wound site if there is drainage.  Once the wound has quit draining you may leave it open to air.  Generally you should leave the bandage intact for twenty four hours unless there is drainage.  If the epidural site drains for more than 36-48 hours please call the anesthesia department.  QUESTIONS?:  Please feel free to call your physician or the hospital operator if you have any questions, and they will be happy to assist you.

## 2011-11-19 MED ORDER — NEOMYCIN-POLYMYXIN-DEXAMETH 3.5-10000-0.1 OP OINT
TOPICAL_OINTMENT | OPHTHALMIC | Status: AC
  Filled 2011-11-19: qty 3.5

## 2011-11-19 MED ORDER — CYCLOPENTOLATE-PHENYLEPHRINE 0.2-1 % OP SOLN
OPHTHALMIC | Status: AC
  Administered 2011-11-20: 1 [drp] via OPHTHALMIC
  Filled 2011-11-19: qty 2

## 2011-11-19 MED ORDER — LIDOCAINE HCL (PF) 1 % IJ SOLN
INTRAMUSCULAR | Status: AC
  Filled 2011-11-19: qty 2

## 2011-11-19 MED ORDER — LIDOCAINE HCL 3.5 % OP GEL
OPHTHALMIC | Status: AC
  Administered 2011-11-20: 1 via OPHTHALMIC
  Filled 2011-11-19: qty 5

## 2011-11-19 MED ORDER — TETRACAINE HCL 0.5 % OP SOLN
OPHTHALMIC | Status: AC
  Administered 2011-11-20: 1 [drp] via OPHTHALMIC
  Filled 2011-11-19: qty 2

## 2011-11-20 ENCOUNTER — Encounter (HOSPITAL_COMMUNITY)
Admission: RE | Disposition: A | Discharge status: Home / Self Care | Payer: Self-pay | Source: Ambulatory Visit | Attending: Ophthalmology

## 2011-11-20 ENCOUNTER — Encounter (HOSPITAL_COMMUNITY): Payer: Self-pay | Admitting: Anesthesiology

## 2011-11-20 ENCOUNTER — Encounter (HOSPITAL_COMMUNITY): Payer: Self-pay | Admitting: *Deleted

## 2011-11-20 ENCOUNTER — Ambulatory Visit (HOSPITAL_COMMUNITY): Payer: Medicare Other | Admitting: Anesthesiology

## 2011-11-20 ENCOUNTER — Ambulatory Visit (HOSPITAL_COMMUNITY)
Admission: RE | Admit: 2011-11-20 | Discharge: 2011-11-20 | Disposition: A | Discharge status: Home / Self Care | Payer: Medicare Other | Source: Ambulatory Visit | Attending: Ophthalmology | Admitting: Ophthalmology

## 2011-11-20 DIAGNOSIS — Z01812 Encounter for preprocedural laboratory examination: Secondary | ICD-10-CM | POA: Insufficient documentation

## 2011-11-20 DIAGNOSIS — I10 Essential (primary) hypertension: Secondary | ICD-10-CM | POA: Insufficient documentation

## 2011-11-20 DIAGNOSIS — H2510 Age-related nuclear cataract, unspecified eye: Secondary | ICD-10-CM | POA: Insufficient documentation

## 2011-11-20 DIAGNOSIS — Z01810 Encounter for preprocedural cardiovascular examination: Secondary | ICD-10-CM | POA: Insufficient documentation

## 2011-11-20 DIAGNOSIS — E119 Type 2 diabetes mellitus without complications: Secondary | ICD-10-CM | POA: Insufficient documentation

## 2011-11-20 DIAGNOSIS — Z794 Long term (current) use of insulin: Secondary | ICD-10-CM | POA: Insufficient documentation

## 2011-11-20 HISTORY — PX: CATARACT EXTRACTION W/PHACO: SHX586

## 2011-11-20 LAB — GLUCOSE, CAPILLARY: Glucose-Capillary: 120 mg/dL — ABNORMAL HIGH (ref 70–99)

## 2011-11-20 SURGERY — PHACOEMULSIFICATION, CATARACT, WITH IOL INSERTION
Anesthesia: Monitor Anesthesia Care | Site: Eye | Laterality: Right | Wound class: Clean

## 2011-11-20 MED ORDER — CYCLOPENTOLATE-PHENYLEPHRINE 0.2-1 % OP SOLN
1.0000 [drp] | OPHTHALMIC | Status: AC
  Administered 2011-11-20 (×3): 1 [drp] via OPHTHALMIC

## 2011-11-20 MED ORDER — EPINEPHRINE HCL 1 MG/ML IJ SOLN
INTRAMUSCULAR | Status: AC
  Filled 2011-11-20: qty 1

## 2011-11-20 MED ORDER — PROVISC 10 MG/ML IO SOLN
INTRAOCULAR | Status: DC | PRN
  Administered 2011-11-20: 8.5 mg via INTRAOCULAR

## 2011-11-20 MED ORDER — BSS IO SOLN
INTRAOCULAR | Status: DC | PRN
  Administered 2011-11-20: 15 mL via INTRAOCULAR

## 2011-11-20 MED ORDER — MIDAZOLAM HCL 2 MG/2ML IJ SOLN
1.0000 mg | INTRAMUSCULAR | Status: DC | PRN
  Administered 2011-11-20: 2 mg via INTRAVENOUS

## 2011-11-20 MED ORDER — MIDAZOLAM HCL 2 MG/2ML IJ SOLN
INTRAMUSCULAR | Status: AC
  Administered 2011-11-20: 2 mg via INTRAVENOUS
  Filled 2011-11-20: qty 2

## 2011-11-20 MED ORDER — TETRACAINE HCL 0.5 % OP SOLN
1.0000 [drp] | OPHTHALMIC | Status: AC
  Administered 2011-11-20 (×3): 1 [drp] via OPHTHALMIC

## 2011-11-20 MED ORDER — PHENYLEPHRINE HCL 2.5 % OP SOLN
1.0000 [drp] | OPHTHALMIC | Status: AC
  Administered 2011-11-20 (×3): 1 [drp] via OPHTHALMIC

## 2011-11-20 MED ORDER — ONDANSETRON HCL 4 MG/2ML IJ SOLN: 4.0000 mg | Freq: Once | INTRAMUSCULAR | Status: DC | PRN

## 2011-11-20 MED ORDER — EPINEPHRINE HCL 1 MG/ML IJ SOLN
INTRAOCULAR | Status: DC | PRN
  Administered 2011-11-20: 13:00:00

## 2011-11-20 MED ORDER — NEOMYCIN-POLYMYXIN-DEXAMETH 0.1 % OP OINT
TOPICAL_OINTMENT | OPHTHALMIC | Status: DC | PRN
  Administered 2011-11-20: 1 via OPHTHALMIC

## 2011-11-20 MED ORDER — FENTANYL CITRATE 0.05 MG/ML IJ SOLN: 25.0000 ug | INTRAMUSCULAR | Status: DC | PRN

## 2011-11-20 MED ORDER — LACTATED RINGERS IV SOLN
INTRAVENOUS | Status: DC
  Administered 2011-11-20: 1000 mL via INTRAVENOUS

## 2011-11-20 MED ORDER — PHENYLEPHRINE HCL 2.5 % OP SOLN
OPHTHALMIC | Status: AC
  Administered 2011-11-20: 1 [drp] via OPHTHALMIC
  Filled 2011-11-20: qty 2

## 2011-11-20 MED ORDER — LIDOCAINE HCL (PF) 1 % IJ SOLN
INTRAMUSCULAR | Status: DC | PRN
  Administered 2011-11-20: .3 mL

## 2011-11-20 MED ORDER — POVIDONE-IODINE 5 % OP SOLN
OPHTHALMIC | Status: DC | PRN
  Administered 2011-11-20: 1 via OPHTHALMIC

## 2011-11-20 MED ORDER — LIDOCAINE HCL 3.5 % OP GEL
1.0000 "application " | Freq: Once | OPHTHALMIC | Status: AC
  Administered 2011-11-20: 1 via OPHTHALMIC

## 2011-11-20 SURGICAL SUPPLY — 32 items
CAPSULAR TENSION RING-AMO (OPHTHALMIC RELATED) IMPLANT
CLOTH BEACON ORANGE TIMEOUT ST (SAFETY) ×2 IMPLANT
EYE SHIELD UNIVERSAL CLEAR (GAUZE/BANDAGES/DRESSINGS) ×2 IMPLANT
GLOVE BIO SURGEON STRL SZ 6.5 (GLOVE) IMPLANT
GLOVE BIOGEL PI IND STRL 6.5 (GLOVE) ×1 IMPLANT
GLOVE BIOGEL PI IND STRL 7.0 (GLOVE) IMPLANT
GLOVE BIOGEL PI IND STRL 7.5 (GLOVE) IMPLANT
GLOVE BIOGEL PI INDICATOR 6.5 (GLOVE) ×1
GLOVE BIOGEL PI INDICATOR 7.0 (GLOVE)
GLOVE BIOGEL PI INDICATOR 7.5 (GLOVE)
GLOVE ECLIPSE 6.5 STRL STRAW (GLOVE) IMPLANT
GLOVE ECLIPSE 7.0 STRL STRAW (GLOVE) IMPLANT
GLOVE ECLIPSE 7.5 STRL STRAW (GLOVE) IMPLANT
GLOVE EXAM NITRILE LRG STRL (GLOVE) IMPLANT
GLOVE EXAM NITRILE MD LF STRL (GLOVE) ×2 IMPLANT
GLOVE SKINSENSE NS SZ6.5 (GLOVE)
GLOVE SKINSENSE NS SZ7.0 (GLOVE)
GLOVE SKINSENSE STRL SZ6.5 (GLOVE) IMPLANT
GLOVE SKINSENSE STRL SZ7.0 (GLOVE) IMPLANT
KIT VITRECTOMY (OPHTHALMIC RELATED) IMPLANT
PAD ARMBOARD 7.5X6 YLW CONV (MISCELLANEOUS) ×2 IMPLANT
PROC W NO LENS (INTRAOCULAR LENS)
PROC W SPEC LENS (INTRAOCULAR LENS)
PROCESS W NO LENS (INTRAOCULAR LENS) IMPLANT
PROCESS W SPEC LENS (INTRAOCULAR LENS) IMPLANT
RING MALYGIN (MISCELLANEOUS) IMPLANT
SIGHTPATH CAT PROC W REG LENS (Ophthalmic Related) ×2 IMPLANT
SYR TB 1ML LL NO SAFETY (SYRINGE) ×2 IMPLANT
TAPE SURG TRANSPORE 1 IN (GAUZE/BANDAGES/DRESSINGS) ×1 IMPLANT
TAPE SURGICAL TRANSPORE 1 IN (GAUZE/BANDAGES/DRESSINGS) ×1
VISCOELASTIC ADDITIONAL (OPHTHALMIC RELATED) IMPLANT
WATER STERILE IRR 250ML POUR (IV SOLUTION) ×2 IMPLANT

## 2011-11-20 NOTE — Anesthesia Preprocedure Evaluation (Signed)
Anesthesia Evaluation  Patient identified by MRN, date of birth, ID band Patient awake    Reviewed: Allergy & Precautions, H&P , NPO status , Patient's Chart, lab work & pertinent test results  Airway Mallampati: II      Dental  (+) Edentulous Upper and Edentulous Lower   Pulmonary neg pulmonary ROS,  breath sounds clear to auscultation        Cardiovascular hypertension, Pt. on medications + dysrhythmias Rhythm:Regular Rate:Normal     Neuro/Psych  Neuromuscular disease    GI/Hepatic   Endo/Other  Diabetes mellitus-, Well Controlled, Type 2, Insulin Dependent  Renal/GU      Musculoskeletal   Abdominal   Peds  Hematology   Anesthesia Other Findings   Reproductive/Obstetrics                           Anesthesia Physical Anesthesia Plan  ASA: III  Anesthesia Plan: MAC   Post-op Pain Management:    Induction: Intravenous  Airway Management Planned: Nasal Cannula  Additional Equipment:   Intra-op Plan:   Post-operative Plan:   Informed Consent: I have reviewed the patients History and Physical, chart, labs and discussed the procedure including the risks, benefits and alternatives for the proposed anesthesia with the patient or authorized representative who has indicated his/her understanding and acceptance.     Plan Discussed with:   Anesthesia Plan Comments:         Anesthesia Quick Evaluation

## 2011-11-20 NOTE — Anesthesia Postprocedure Evaluation (Signed)
  Anesthesia Post-op Note  Patient: [name]  Procedure(s) Performed: Procedure(s) (LRB): CATARACT EXTRACTION PHACO AND INTRAOCULAR LENS PLACEMENT (IOC) (Right)  Patient Location:  Short Stay  Anesthesia Type: MAC  Level of Consciousness: awake  Airway and Oxygen Therapy: Patient Spontanous Breathing  Post-op Pain: none  Post-op Assessment: Post-op Vital signs reviewed, Patient's Cardiovascular Status Stable, Respiratory Function Stable, Patent Airway, No signs of Nausea or vomiting and Pain level controlled  Post-op Vital Signs: Reviewed and stable  Complications: No apparent anesthesia complications

## 2011-11-20 NOTE — Discharge Instructions (Signed)
[name]  11/20/2011     Instructions  1. Use medications as Instructed.  Shake well before use. Wait 5 minutes between drops.  {OPHTHALMIC ANTIBIOTICS:22167} 4 times a day x 1 week.  {OPHTHALMIC ANTI-INFLAMMATORY:22168} 2 times a day x 4 weeks.  {OPHTHALMIC STEROID:22169} 4 times a day - week 1   3 times a day - Week 2, 2 times a day- Week 3, 1 time a day - Week 4.  2. Do not rub the operative eye. Do not swim underwater for 2 weeks.  3. You may remove the clear shield and resume your normal activities the day after  Surgery. Your eyes may feel more comfortable if you wear dark glasses outside.  4. Call our office at [phone] if you have sudden change in vision, extreme redness or pain. Some fluctuation in vision is normal after surgery. If you have an emergency after hours, call Dr. Geoffry Paradise at [phone].  5. It is important that you attend all of your follow-up appointments.        Follow-up:{follow up:32580} with Tonny Branch, MD.   Dr. Loran Senters: [phone]  Dr. Iona HansenJI:[id number]  Dr. Geoffry ParadiseID:[id number]   If you find that you cannot contact your physician, but feel that your signs and   Symptoms warrant a physician's attention, call the Emergency Room at   [phone] [phone].   Other{NA AND VA:[id number].

## 2011-11-20 NOTE — H&P (Signed)
I have reviewed the H&P, the patient was re-examined, and I have identified no interval changes in medical condition and plan of care since the history and physical of record  

## 2011-11-20 NOTE — Brief Op Note (Signed)
Pre-Op Dx: Cataract OD Post-Op Dx: Cataract OD Surgeon: Kashden Deboy Anesthesia: Topical with MAC Surgery: Cataract Extraction with Intraocular lens Implant OD Implant: Lenstec, Model Softec HD Blood Loss: None Specimen: None Complications: None 

## 2011-11-20 NOTE — Anesthesia Procedure Notes (Signed)
Procedure Name: MAC Date/Time: 11/20/2011 1:06 PM Performed by: Vista Deck Pre-anesthesia Checklist: Patient identified, Emergency Drugs available, Suction available, Timeout performed and Patient being monitored Patient Re-evaluated:Patient Re-evaluated prior to inductionOxygen Delivery Method: Nasal Cannula

## 2011-11-20 NOTE — Transfer of Care (Signed)
Immediate Anesthesia Transfer of Care Note  Patient: [name]  Procedure(s) Performed: Procedure(s) (LRB): CATARACT EXTRACTION PHACO AND INTRAOCULAR LENS PLACEMENT (IOC) (Right)  Patient Location: Shortstay  Anesthesia Type: MAC  Level of Consciousness: awake  Airway & Oxygen Therapy: Patient Spontanous Breathing   Post-op Assessment: Report given to PACU RN, Post -op Vital signs reviewed and stable and Patient moving all extremities  Post vital signs: Reviewed and stable  Complications: No apparent anesthesia complications

## 2011-11-21 ENCOUNTER — Encounter (HOSPITAL_COMMUNITY): Payer: Self-pay | Admitting: Ophthalmology

## 2011-11-21 NOTE — Op Note (Signed)
NAME:  [name], [name] NO.:  [id number]  MEDICAL RECORD NO.:  [id number]  LOCATION:  [facility]                          FACILITY:  [facility]  PHYSICIAN:  Richardo Hanks, MD       DATE OF BIRTH:  12/17/1940  DATE OF PROCEDURE:  11/20/2011 DATE OF DISCHARGE:  11/20/2011                              OPERATIVE REPORT   PREOPERATIVE DIAGNOSIS:  Combined cataract, right eye, diagnosis code 366.19.  POSTOPERATIVE DIAGNOSIS:  Combined cataract, right eye, diagnosis code 366.19.  OPERATION PERFORMED:  Phacoemulsification with posterior chamber intraocular lens implantation, right eye.  SURGEON:  Franky Macho. Abid Bolla, MD  ANESTHESIA:  Topical with IV sedation.  OPERATIVE SUMMARY:  In the preoperative area, dilating drops were placed into the right eye.  The patient was then brought into the operating room where she was placed under general anesthesia.  The eye was then prepped and draped.  Beginning with a 75 blade, a paracentesis port was made at the surgeon's 2 o'clock position.  The anterior chamber was then filled with a 1% nonpreserved lidocaine solution with epinephrine.  This was followed by Viscoat to deepen the chamber.  A small fornix-based peritomy was performed superiorly.  Next, a single iris hook was placed through the limbus superiorly.  A 2.4-mm keratome blade was then used to make a clear corneal incision over the iris hook.  A bent cystotome needle and Utrata forceps were used to create a continuous tear capsulotomy.  Hydrodissection was performed using balanced salt solution on a fine cannula.  The lens nucleus was then removed using phacoemulsification in a quadrant cracking technique.  The cortical material was then removed with irrigation and aspiration.  The capsular bag and anterior chamber were refilled with Provisc.  The wound was widened to approximately 3 mm and a posterior chamber intraocular lens was placed into the capsular bag without difficulty using  an [employer] lens injecting system.  A single 10-0 nylon suture was then used to close the incision as well as stromal hydration.  The Provisc was removed from the anterior chamber and capsular bag with irrigation and aspiration.  At this point, the wounds were tested for leak, which were negative.  The anterior chamber remained deep and stable.  The patient tolerated the procedure well.  There were no operative complications, and she awoke from general anesthesia without problem.  No surgical specimens.  Prosthetic device used is a Lenstec posterior chamber lens, model Softec HD, power of 18.0, serial number is [id number].          ______________________________ Richardo Hanks, MD     KEH/MEDQ  D:  11/21/2011  T:  11/21/2011  Job:  [id number]

## 2012-05-25 DIAGNOSIS — W19XXXA Unspecified fall, initial encounter: Secondary | ICD-10-CM | POA: Insufficient documentation

## 2012-11-02 ENCOUNTER — Other Ambulatory Visit (HOSPITAL_COMMUNITY): Payer: Self-pay | Admitting: Nephrology

## 2012-11-02 DIAGNOSIS — N289 Disorder of kidney and ureter, unspecified: Secondary | ICD-10-CM

## 2012-11-10 ENCOUNTER — Encounter (HOSPITAL_COMMUNITY): Payer: Self-pay

## 2012-11-10 ENCOUNTER — Encounter (HOSPITAL_COMMUNITY)
Admission: RE | Admit: 2012-11-10 | Discharge: 2012-11-10 | Disposition: A | Discharge status: Home / Self Care | Payer: Medicare Other | Source: Ambulatory Visit | Attending: Ophthalmology | Admitting: Ophthalmology

## 2012-11-10 ENCOUNTER — Encounter (HOSPITAL_COMMUNITY): Payer: Self-pay | Admitting: Pharmacy Technician

## 2012-11-10 LAB — BASIC METABOLIC PANEL
CO2: 22 mEq/L (ref 19–32)
Chloride: 103 mEq/L (ref 96–112)
Glucose, Bld: 263 mg/dL — ABNORMAL HIGH (ref 70–99)
Potassium: 4.4 mEq/L (ref 3.5–5.1)
Sodium: 137 mEq/L (ref 135–145)

## 2012-11-10 LAB — HEMOGLOBIN AND HEMATOCRIT, BLOOD: HCT: 27.3 % — ABNORMAL LOW (ref 36.0–46.0)

## 2012-11-10 NOTE — Patient Instructions (Addendum)
Your procedure is scheduled on: 11/18/2012  Report to [facility] at  64   AM.  Call this number if you have problems the morning of surgery: [phone]   Do not eat food or drink liquids :After Midnight.      Take these medicines the morning of surgery with A SIP OF WATER: Amlodipine-valsartan-HCTZ   Do not wear jewelry, make-up or nail polish.  Do not wear lotions, powders, or perfumes.   Do not shave 48 hours prior to surgery.  Do not bring valuables to the hospital.  Contacts, dentures or bridgework may not be worn into surgery.  Leave suitcase in the car. After surgery it may be brought to your room.  For patients admitted to the hospital, checkout time is 11:00 AM the day of discharge.   Patients discharged the day of surgery will not be allowed to drive home.  :     Please read over the following fact sheets that you were given: Coughing and Deep Breathing, Surgical Site Infection Prevention, Anesthesia Post-op Instructions and Care and Recovery After Surgery    Cataract A cataract is a clouding of the lens of the eye. When a lens becomes cloudy, vision is reduced based on the degree and nature of the clouding. Many cataracts reduce vision to some degree. Some cataracts make people more near-sighted as they develop. Other cataracts increase glare. Cataracts that are ignored and become worse can sometimes look white. The white color can be seen through the pupil. CAUSES   Aging. However, cataracts may occur at any age, even in newborns.   Certain drugs.   Trauma to the eye.   Certain diseases such as diabetes.   Specific eye diseases such as chronic inflammation inside the eye or a sudden attack of a rare form of glaucoma.   Inherited or acquired medical problems.  SYMPTOMS   Gradual, progressive drop in vision in the affected eye.   Severe, rapid visual loss. This most often happens when trauma is the cause.  DIAGNOSIS  To detect a cataract, an eye doctor examines the  lens. Cataracts are best diagnosed with an exam of the eyes with the pupils enlarged (dilated) by drops.  TREATMENT  For an early cataract, vision may improve by using different eyeglasses or stronger lighting. If that does not help your vision, surgery is the only effective treatment. A cataract needs to be surgically removed when vision loss interferes with your everyday activities, such as driving, reading, or watching TV. A cataract may also have to be removed if it prevents examination or treatment of another eye problem. Surgery removes the cloudy lens and usually replaces it with a substitute lens (intraocular lens, IOL).  At a time when both you and your doctor agree, the cataract will be surgically removed. If you have cataracts in both eyes, only one is usually removed at a time. This allows the operated eye to heal and be out of danger from any possible problems after surgery (such as infection or poor wound healing). In rare cases, a cataract may be doing damage to your eye. In these cases, your caregiver may advise surgical removal right away. The vast majority of people who have cataract surgery have better vision afterward. HOME CARE INSTRUCTIONS  If you are not planning surgery, you may be asked to do the following:  Use different eyeglasses.   Use stronger or brighter lighting.   Ask your eye doctor about reducing your medicine dose or changing medicines  if it is thought that a medicine caused your cataract. Changing medicines does not make the cataract go away on its own.   Become familiar with your surroundings. Poor vision can lead to injury. Avoid bumping into things on the affected side. You are at a higher risk for tripping or falling.   Exercise extreme care when driving or operating machinery.   Wear sunglasses if you are sensitive to bright light or experiencing problems with glare.  SEEK IMMEDIATE MEDICAL CARE IF:   You have a worsening or sudden vision loss.   You  notice redness, swelling, or increasing pain in the eye.   You have a fever.  Document Released: 06/02/2005 Document Revised: 05/22/2011 Document Reviewed: 01/24/2011 [facility] Patient Information 2012 [place].PATIENT INSTRUCTIONS POST-ANESTHESIA  IMMEDIATELY FOLLOWING SURGERY:  Do not drive or operate machinery for the first twenty four hours after surgery.  Do not make any important decisions for twenty four hours after surgery or while taking narcotic pain medications or sedatives.  If you develop intractable nausea and vomiting or a severe headache please notify your doctor immediately.  FOLLOW-UP:  Please make an appointment with your surgeon as instructed. You do not need to follow up with anesthesia unless specifically instructed to do so.  WOUND CARE INSTRUCTIONS (if applicable):  Keep a dry clean dressing on the anesthesia/puncture wound site if there is drainage.  Once the wound has quit draining you may leave it open to air.  Generally you should leave the bandage intact for twenty four hours unless there is drainage.  If the epidural site drains for more than 36-48 hours please call the anesthesia department.  QUESTIONS?:  Please feel free to call your physician or the hospital operator if you have any questions, and they will be happy to assist you.

## 2012-11-17 MED ORDER — TETRACAINE HCL 0.5 % OP SOLN
OPHTHALMIC | Status: AC
  Filled 2012-11-17: qty 2

## 2012-11-17 MED ORDER — CYCLOPENTOLATE-PHENYLEPHRINE 0.2-1 % OP SOLN
OPHTHALMIC | Status: AC
  Filled 2012-11-17: qty 2

## 2012-11-17 MED ORDER — NEOMYCIN-POLYMYXIN-DEXAMETH 3.5-10000-0.1 OP OINT
TOPICAL_OINTMENT | OPHTHALMIC | Status: AC
  Filled 2012-11-17: qty 3.5

## 2012-11-17 MED ORDER — LIDOCAINE HCL (PF) 1 % IJ SOLN
INTRAMUSCULAR | Status: AC
  Filled 2012-11-17: qty 2

## 2012-11-17 MED ORDER — PHENYLEPHRINE HCL 2.5 % OP SOLN
OPHTHALMIC | Status: AC
  Filled 2012-11-17: qty 2

## 2012-11-18 ENCOUNTER — Ambulatory Visit (HOSPITAL_COMMUNITY): Payer: Medicare Other | Admitting: Anesthesiology

## 2012-11-18 ENCOUNTER — Ambulatory Visit (HOSPITAL_COMMUNITY)
Admission: RE | Admit: 2012-11-18 | Discharge: 2012-11-18 | Disposition: A | Discharge status: Home / Self Care | Payer: Medicare Other | Source: Ambulatory Visit | Attending: Ophthalmology | Admitting: Ophthalmology

## 2012-11-18 ENCOUNTER — Encounter (HOSPITAL_COMMUNITY)
Admission: RE | Disposition: A | Discharge status: Home / Self Care | Payer: Self-pay | Source: Ambulatory Visit | Attending: Ophthalmology

## 2012-11-18 ENCOUNTER — Encounter (HOSPITAL_COMMUNITY): Payer: Self-pay | Admitting: Anesthesiology

## 2012-11-18 ENCOUNTER — Encounter (HOSPITAL_COMMUNITY): Payer: Self-pay

## 2012-11-18 DIAGNOSIS — Z01812 Encounter for preprocedural laboratory examination: Secondary | ICD-10-CM | POA: Insufficient documentation

## 2012-11-18 DIAGNOSIS — Z01810 Encounter for preprocedural cardiovascular examination: Secondary | ICD-10-CM | POA: Insufficient documentation

## 2012-11-18 DIAGNOSIS — I10 Essential (primary) hypertension: Secondary | ICD-10-CM | POA: Insufficient documentation

## 2012-11-18 DIAGNOSIS — H2510 Age-related nuclear cataract, unspecified eye: Secondary | ICD-10-CM | POA: Insufficient documentation

## 2012-11-18 DIAGNOSIS — Z79899 Other long term (current) drug therapy: Secondary | ICD-10-CM | POA: Insufficient documentation

## 2012-11-18 HISTORY — PX: CATARACT EXTRACTION W/PHACO: SHX586

## 2012-11-18 LAB — GLUCOSE, CAPILLARY: Glucose-Capillary: 119 mg/dL — ABNORMAL HIGH (ref 70–99)

## 2012-11-18 SURGERY — PHACOEMULSIFICATION, CATARACT, WITH IOL INSERTION
Anesthesia: Monitor Anesthesia Care | Site: Eye | Laterality: Left | Wound class: Clean

## 2012-11-18 MED ORDER — MIDAZOLAM HCL 2 MG/2ML IJ SOLN
1.0000 mg | INTRAMUSCULAR | Status: DC | PRN
  Administered 2012-11-18: 2 mg via INTRAVENOUS

## 2012-11-18 MED ORDER — POVIDONE-IODINE 5 % OP SOLN
OPHTHALMIC | Status: DC | PRN
  Administered 2012-11-18: 1 via OPHTHALMIC

## 2012-11-18 MED ORDER — LIDOCAINE HCL (PF) 1 % IJ SOLN
INTRAMUSCULAR | Status: DC | PRN
  Administered 2012-11-18: .5 mL

## 2012-11-18 MED ORDER — EPINEPHRINE HCL 1 MG/ML IJ SOLN
INTRAOCULAR | Status: DC | PRN
  Administered 2012-11-18: 11:00:00

## 2012-11-18 MED ORDER — NEOMYCIN-POLYMYXIN-DEXAMETH 0.1 % OP OINT
TOPICAL_OINTMENT | OPHTHALMIC | Status: DC | PRN
  Administered 2012-11-18: 1

## 2012-11-18 MED ORDER — EPINEPHRINE HCL 1 MG/ML IJ SOLN
INTRAMUSCULAR | Status: AC
  Filled 2012-11-18: qty 1

## 2012-11-18 MED ORDER — LIDOCAINE HCL 3.5 % OP GEL
OPHTHALMIC | Status: AC
  Filled 2012-11-18: qty 5

## 2012-11-18 MED ORDER — CYCLOPENTOLATE-PHENYLEPHRINE 0.2-1 % OP SOLN
1.0000 [drp] | OPHTHALMIC | Status: AC
  Administered 2012-11-18 (×3): 1 [drp] via OPHTHALMIC

## 2012-11-18 MED ORDER — TETRACAINE HCL 0.5 % OP SOLN
1.0000 [drp] | OPHTHALMIC | Status: AC
  Administered 2012-11-18 (×3): 1 [drp] via OPHTHALMIC

## 2012-11-18 MED ORDER — LACTATED RINGERS IV SOLN
INTRAVENOUS | Status: DC | PRN
  Administered 2012-11-18: 10:00:00 via INTRAVENOUS

## 2012-11-18 MED ORDER — LIDOCAINE HCL 3.5 % OP GEL
1.0000 "application " | Freq: Once | OPHTHALMIC | Status: AC
  Administered 2012-11-18: 1 via OPHTHALMIC

## 2012-11-18 MED ORDER — LACTATED RINGERS IV SOLN
INTRAVENOUS | Status: DC
  Administered 2012-11-18: 11:00:00 via INTRAVENOUS

## 2012-11-18 MED ORDER — PROVISC 10 MG/ML IO SOLN
INTRAOCULAR | Status: DC | PRN
  Administered 2012-11-18: 8.5 mg via INTRAOCULAR

## 2012-11-18 MED ORDER — PHENYLEPHRINE HCL 2.5 % OP SOLN
1.0000 [drp] | OPHTHALMIC | Status: AC
  Administered 2012-11-18 (×3): 1 [drp] via OPHTHALMIC

## 2012-11-18 MED ORDER — MIDAZOLAM HCL 2 MG/2ML IJ SOLN
INTRAMUSCULAR | Status: AC
  Filled 2012-11-18: qty 2

## 2012-11-18 MED ORDER — BSS IO SOLN
INTRAOCULAR | Status: DC | PRN
  Administered 2012-11-18: 15 mL via INTRAOCULAR

## 2012-11-18 SURGICAL SUPPLY — 32 items
CAPSULAR TENSION RING-AMO (OPHTHALMIC RELATED) IMPLANT
CLOTH BEACON ORANGE TIMEOUT ST (SAFETY) ×2 IMPLANT
EYE SHIELD UNIVERSAL CLEAR (GAUZE/BANDAGES/DRESSINGS) ×2 IMPLANT
GLOVE BIO SURGEON STRL SZ 6.5 (GLOVE) IMPLANT
GLOVE BIOGEL PI IND STRL 6.5 (GLOVE) ×1 IMPLANT
GLOVE BIOGEL PI IND STRL 7.0 (GLOVE) IMPLANT
GLOVE BIOGEL PI IND STRL 7.5 (GLOVE) IMPLANT
GLOVE BIOGEL PI INDICATOR 6.5 (GLOVE) ×1
GLOVE BIOGEL PI INDICATOR 7.0 (GLOVE)
GLOVE BIOGEL PI INDICATOR 7.5 (GLOVE)
GLOVE ECLIPSE 6.5 STRL STRAW (GLOVE) IMPLANT
GLOVE ECLIPSE 7.0 STRL STRAW (GLOVE) IMPLANT
GLOVE ECLIPSE 7.5 STRL STRAW (GLOVE) IMPLANT
GLOVE EXAM NITRILE LRG STRL (GLOVE) ×2 IMPLANT
GLOVE EXAM NITRILE MD LF STRL (GLOVE) IMPLANT
GLOVE SKINSENSE NS SZ6.5 (GLOVE)
GLOVE SKINSENSE NS SZ7.0 (GLOVE)
GLOVE SKINSENSE STRL SZ6.5 (GLOVE) IMPLANT
GLOVE SKINSENSE STRL SZ7.0 (GLOVE) IMPLANT
KIT VITRECTOMY (OPHTHALMIC RELATED) IMPLANT
PAD ARMBOARD 7.5X6 YLW CONV (MISCELLANEOUS) ×2 IMPLANT
PROC W NO LENS (INTRAOCULAR LENS)
PROC W SPEC LENS (INTRAOCULAR LENS)
PROCESS W NO LENS (INTRAOCULAR LENS) IMPLANT
PROCESS W SPEC LENS (INTRAOCULAR LENS) IMPLANT
RING MALYGIN (MISCELLANEOUS) IMPLANT
SIGHTPATH CAT PROC W REG LENS (Ophthalmic Related) ×2 IMPLANT
SYR TB 1ML LL NO SAFETY (SYRINGE) ×2 IMPLANT
TAPE SURG TRANSPORE 1 IN (GAUZE/BANDAGES/DRESSINGS) ×1 IMPLANT
TAPE SURGICAL TRANSPORE 1 IN (GAUZE/BANDAGES/DRESSINGS) ×1
VISCOELASTIC ADDITIONAL (OPHTHALMIC RELATED) IMPLANT
WATER STERILE IRR 250ML POUR (IV SOLUTION) ×2 IMPLANT

## 2012-11-18 NOTE — Anesthesia Postprocedure Evaluation (Signed)
  Anesthesia Post-op Note  Patient: [name]  Procedure(s) Performed: Procedure(s) with comments: CATARACT EXTRACTION PHACO AND INTRAOCULAR LENS PLACEMENT (IOC) (Left) - CDE: 18.97  Patient Location: Short Stay  Anesthesia Type:MAC  Level of Consciousness: awake, alert , oriented and patient cooperative  Airway and Oxygen Therapy: Patient Spontanous Breathing  Post-op Pain: none  Post-op Assessment: Post-op Vital signs reviewed, Patient's Cardiovascular Status Stable, Respiratory Function Stable, Patent Airway and Pain level controlled  Post-op Vital Signs: Reviewed and stable  Complications: No apparent anesthesia complications

## 2012-11-18 NOTE — Anesthesia Preprocedure Evaluation (Signed)
Anesthesia Evaluation  Patient identified by MRN, date of birth, ID band Patient awake    Reviewed: Allergy & Precautions, H&P , NPO status , Patient's Chart, lab work & pertinent test results  Airway Mallampati: II      Dental  (+) Edentulous Upper and Edentulous Lower   Pulmonary neg pulmonary ROS,  breath sounds clear to auscultation        Cardiovascular hypertension, Pt. on medications + dysrhythmias Rhythm:Regular Rate:Normal     Neuro/Psych  Neuromuscular disease    GI/Hepatic   Endo/Other    Renal/GU      Musculoskeletal   Abdominal   Peds  Hematology   Anesthesia Other Findings   Reproductive/Obstetrics                           Anesthesia Physical Anesthesia Plan  ASA: III  Anesthesia Plan: MAC   Post-op Pain Management:    Induction: Intravenous  Airway Management Planned: Nasal Cannula  Additional Equipment:   Intra-op Plan:   Post-operative Plan:   Informed Consent: I have reviewed the patients History and Physical, chart, labs and discussed the procedure including the risks, benefits and alternatives for the proposed anesthesia with the patient or authorized representative who has indicated his/her understanding and acceptance.     Plan Discussed with:   Anesthesia Plan Comments:         Anesthesia Quick Evaluation

## 2012-11-18 NOTE — Transfer of Care (Signed)
Immediate Anesthesia Transfer of Care Note  Patient: [name]  Procedure(s) Performed: Procedure(s) with comments: CATARACT EXTRACTION PHACO AND INTRAOCULAR LENS PLACEMENT (IOC) (Left) - CDE: 18.97  Patient Location: Short Stay  Anesthesia Type:MAC  Level of Consciousness: awake, alert , oriented and patient cooperative  Airway & Oxygen Therapy: Patient Spontanous Breathing  Post-op Assessment: Report given to PACU RN and Post -op Vital signs reviewed and stable  Post vital signs: Reviewed and stable  Complications: No apparent anesthesia complications

## 2012-11-18 NOTE — H&P (Signed)
I have reviewed the H&P, the patient was re-examined, and I have identified no interval changes in medical condition and plan of care since the history and physical of record  

## 2012-11-18 NOTE — Op Note (Signed)
Date of Admission: 11/18/2012  Date of Surgery: 11/18/2012  Pre-Op Dx: Cataract  Left  Eye  Post-Op Dx: Combined Cataract  Left  Eye,  Dx Code 366.19  Surgeon: Tonny Branch, M.D.  Assistants: None  Anesthesia: Topical with MAC  Indications: Painless, progressive loss of vision with compromise of daily activities.  Surgery: Cataract Extraction with Intraocular lens Implant Left Eye  Discription: The patient had dilating drops and viscous lidocaine placed into the left eye in the pre-op holding area. After transfer to the operating room, a time out was performed. The patient was then prepped and draped. Beginning with a 41 degree blade a paracentesis port was made at the surgeon's 2 o'clock position. The anterior chamber was then filled with 1% non-preserved lidocaine. This was followed by filling the anterior chamber with Provisc. A bent cystatome needle was used to create a continuous tear capsulotomy. Hydrodissection was performed with balanced salt solution on a Fine canula. The lens nucleus was then removed using the phacoemulsification handpiece. Residual cortex was removed with the I&A handpiece. The anterior chamber and capsular bag were refilled with Provisc. A posterior chamber intraocular lens was placed into the capsular bag with it's injector. The implant was positioned with the Kuglan hook. The Provisc was then removed from the anterior chamber and capsular bag with the I&A handpiece. Stromal hydration of the main incision and paracentesis port was performed with BSS on a Fine canula. The wounds were tested for leak which was negative. The patient tolerated the procedure well. There were no operative complications. The patient was then transferred to the recovery room in stable condition.  Complications: None  Specimen: None  EBL: None  Prosthetic device: B&L enVista, MX60, power 20.0D.

## 2012-11-18 NOTE — Preoperative (Signed)
Beta Blockers   Reason not to administer Beta Blockers:Not Applicable 

## 2012-11-18 NOTE — Anesthesia Procedure Notes (Signed)
Procedure Name: MAC Date/Time: 11/18/2012 11:05 AM Performed by: Antony Contras, AMY L Pre-anesthesia Checklist: Patient identified, Timeout performed, Emergency Drugs available, Suction available and Patient being monitored Oxygen Delivery Method: Nasal cannula

## 2012-11-19 ENCOUNTER — Encounter (HOSPITAL_COMMUNITY): Payer: Self-pay | Admitting: Ophthalmology

## 2012-11-22 ENCOUNTER — Ambulatory Visit (HOSPITAL_COMMUNITY)
Admission: RE | Admit: 2012-11-22 | Discharge: 2012-11-22 | Disposition: A | Discharge status: Home / Self Care | Payer: Medicare Other | Source: Ambulatory Visit | Attending: Nephrology | Admitting: Nephrology

## 2012-11-22 DIAGNOSIS — N289 Disorder of kidney and ureter, unspecified: Secondary | ICD-10-CM

## 2012-11-22 DIAGNOSIS — N189 Chronic kidney disease, unspecified: Secondary | ICD-10-CM | POA: Insufficient documentation

## 2012-12-08 ENCOUNTER — Other Ambulatory Visit (HOSPITAL_COMMUNITY): Payer: Self-pay | Admitting: Family Medicine

## 2012-12-08 DIAGNOSIS — Z139 Encounter for screening, unspecified: Secondary | ICD-10-CM

## 2012-12-09 ENCOUNTER — Ambulatory Visit (HOSPITAL_COMMUNITY)
Admission: RE | Admit: 2012-12-09 | Discharge: 2012-12-09 | Disposition: A | Discharge status: Home / Self Care | Payer: Medicare Other | Source: Ambulatory Visit | Attending: Family Medicine | Admitting: Family Medicine

## 2012-12-09 DIAGNOSIS — Z1231 Encounter for screening mammogram for malignant neoplasm of breast: Secondary | ICD-10-CM | POA: Insufficient documentation

## 2012-12-09 DIAGNOSIS — Z139 Encounter for screening, unspecified: Secondary | ICD-10-CM

## 2012-12-15 ENCOUNTER — Other Ambulatory Visit: Payer: Self-pay | Admitting: Family Medicine

## 2012-12-15 DIAGNOSIS — R928 Other abnormal and inconclusive findings on diagnostic imaging of breast: Secondary | ICD-10-CM

## 2012-12-22 ENCOUNTER — Ambulatory Visit (HOSPITAL_COMMUNITY)
Admission: RE | Admit: 2012-12-22 | Discharge: 2012-12-22 | Disposition: A | Discharge status: Home / Self Care | Payer: Medicare Other | Source: Ambulatory Visit | Attending: Family Medicine | Admitting: Family Medicine

## 2012-12-22 DIAGNOSIS — R928 Other abnormal and inconclusive findings on diagnostic imaging of breast: Secondary | ICD-10-CM | POA: Insufficient documentation

## 2012-12-24 ENCOUNTER — Other Ambulatory Visit: Payer: Self-pay | Admitting: *Deleted

## 2012-12-24 DIAGNOSIS — Z01810 Encounter for preprocedural cardiovascular examination: Secondary | ICD-10-CM

## 2012-12-24 DIAGNOSIS — N184 Chronic kidney disease, stage 4 (severe): Secondary | ICD-10-CM

## 2013-01-03 ENCOUNTER — Encounter (INDEPENDENT_AMBULATORY_CARE_PROVIDER_SITE_OTHER): Payer: Self-pay | Admitting: *Deleted

## 2013-01-05 ENCOUNTER — Encounter (INDEPENDENT_AMBULATORY_CARE_PROVIDER_SITE_OTHER): Payer: Self-pay | Admitting: *Deleted

## 2013-01-11 ENCOUNTER — Encounter: Payer: Self-pay | Admitting: Vascular Surgery

## 2013-01-12 ENCOUNTER — Encounter: Payer: Self-pay | Admitting: Vascular Surgery

## 2013-01-12 ENCOUNTER — Encounter (INDEPENDENT_AMBULATORY_CARE_PROVIDER_SITE_OTHER): Payer: Medicare Other | Admitting: *Deleted

## 2013-01-12 ENCOUNTER — Ambulatory Visit (INDEPENDENT_AMBULATORY_CARE_PROVIDER_SITE_OTHER): Payer: Medicare Other | Admitting: Vascular Surgery

## 2013-01-12 VITALS — BP 170/76 | HR 91 | Resp 16 | Ht 66.0 in | Wt 182.0 lb

## 2013-01-12 DIAGNOSIS — N184 Chronic kidney disease, stage 4 (severe): Secondary | ICD-10-CM

## 2013-01-12 DIAGNOSIS — Z01810 Encounter for preprocedural cardiovascular examination: Secondary | ICD-10-CM

## 2013-01-12 NOTE — Progress Notes (Signed)
[facility] [place]  Referred by:  Harriett Sine, MD [address], [place] [address]  Reason for referral: New access  History of Present Illness  [name] is a 72 y.o. (01/04/1941) female who presents for evaluation for permanent access.  The patient is right  hand dominant.  She is currently not on hemodialysis. The patient has not had previous access procedures.   No previous central venous cannulation procedures.  The patient has not had a  PPM placed.  Her past medical condition include hypertension with amlodipine-valsartan-HCTZ, DM controlled using Insulin, and CKD is being followed by lab work.  Past Medical History  Diagnosis Date  . Hypertension   . Cataract   . Diabetes mellitus   . Chronic kidney disease     Past Surgical History  Procedure Laterality Date  . No past surgeries    . Cataract extraction w/phaco  11/20/2011    Procedure: CATARACT EXTRACTION PHACO AND INTRAOCULAR LENS PLACEMENT (IOC);  Surgeon: Tonny Branch, MD;  Location: [facility];  Service: Ophthalmology;  Laterality: Right;  CDE 18.82  . Cataract extraction w/phaco Left 11/18/2012    Procedure: CATARACT EXTRACTION PHACO AND INTRAOCULAR LENS PLACEMENT (IOC);  Surgeon: Tonny Branch, MD;  Location: [facility];  Service: Ophthalmology;  Laterality: Left;  CDE: 18.97    History   Social History  . Marital Status: Widowed    Spouse Name: N/A    Number of Children: N/A  . Years of Education: N/A   Occupational History  . Not on file.   Social History Main Topics  . Smoking status: Never Smoker   . Smokeless tobacco: Never Used  . Alcohol Use: No  . Drug Use: No  . Sexually Active: Not on file   Other Topics Concern  . Not on file   Social History Narrative  . No narrative on file    Family History  Problem Relation Age of Onset  . Anesthesia problems Neg Hx   . Hypotension Neg Hx   . Malignant hyperthermia Neg Hx   . Pseudochol deficiency Neg Hx   .  Cancer Sister   . Cancer Brother       Current Outpatient Prescriptions on File Prior to Visit  Medication Sig Dispense Refill  . Amlodipine-Valsartan-HCTZ 10-320-25 MG TABS Take 1 tablet by mouth daily.      . carboxymethylcellulose (REFRESH PLUS) 0.5 % SOLN Place 1 drop into both eyes 2 (two) times daily as needed.       Marland Kitchen ibuprofen (ADVIL,MOTRIN) 200 MG tablet Take 400 mg by mouth every 6 (six) hours as needed for pain.      Marland Kitchen insulin aspart protamine-insulin aspart (NOVOLOG 70/30) (70-30) 100 UNIT/ML injection Inject 44-60 Units into the skin 2 (two) times daily with a meal. Patient takes 60 units in the morning and 44 units in the evening       No current facility-administered medications on file prior to visit.    No Known Allergies    REVIEW OF SYSTEMS:  (Positives checked otherwise negative)  CARDIOVASCULAR:  []  chest pain, []  chest pressure, []  palpitations, []  shortness of breath when laying flat, []  shortness of breath with exertion,  []  pain in feet when walking, []  pain in feet when laying flat, []  history of blood clot in veins (DVT), []  history of phlebitis, []  swelling in legs, []  varicose veins  PULMONARY:  []  productive cough, []  asthma, []  wheezing  NEUROLOGIC:  []  weakness  in arms or legs, []  numbness in arms or legs, []  difficulty speaking or slurred speech, []  temporary loss of vision in one eye, []  dizziness  HEMATOLOGIC:  []  bleeding problems, []  problems with blood clotting too easily  MUSCULOSKEL:  []  joint pain, []  joint swelling[x]  edema LE min.  GASTROINTEST:  []  vomiting blood, []  blood in stool     GENITOURINARY:  []  burning with urination, []  blood in urine  PSYCHIATRIC:  []  history of major depression  INTEGUMENTARY:  []  rashes, []  ulcers  CONSTITUTIONAL:  []  fever, []  chills  For VQI Use Only    PRE-ADM LIVING: Home  AMB STATUS: Ambulatory  CAD Sx: None  PRIOR CHF: None  STRESS TEST: [x ] No, [ ]  Normal, [ ]  + ischemia, [ ]  + MI,  [ ]  Both    Physical Examination  Filed Vitals:   01/12/13 1534  BP: 170/76  Pulse: 91  Resp: 16  Height: 5\' 6"  (1.676 m)  Weight: 182 lb (82.555 kg)   Body mass index is 29.39 kg/(m^2).  General: A&O x 3, WD Head:  Ear/Nose/Throat: Hearing grossly intact, nares w/o erythema or drainage, oropharynx w/o Erythema/Exudate, Eyes: PERRLA, EOMI Neck: Supple Pulmonary: Sym exp, good air movt, CTAB, no rales, rhonchi, & wheezing Cardiac: RRR, Nl S1, S2,   Vascular: Vessel Right Left  Radial  Palpable  Palpable  Ulnar  Palpable  Palpable  Brachial  Palpable  Palpable  Carotid  Palpable, without bruit  Palpable, without bruit  Aorta   palpable N/A  Femoral  Palpable  Palpable  Popliteal  Not palpable  Not palpable  PT  Palpable  Palpable  DP  Palpable  Palpable   Gastrointestinal: soft, NTTP Musculoskeletal: M/S 5/5 throughout, Extremities without ischemic changes.  Pitting edema in bilateral ankles Neurologic: CN 2-12 intact Motor exam as listed above  Psychiatric: Judgment intact, Mood & affect appropriate for pt's clinical situation Dermatologic: See M/S exam for extremity exam, no rashes otherwise noted  Lymph : No Cervical, Axillary, or Inguinal lymphadenopathy   Non-Invasive Vascular Imaging  Vein Mapping  (Date: 01/12/2013):   R arm: acceptable vein conduits include none  L arm: acceptable vein conduits include none  Small veins 0.16-0.27  See vein mapping performed in there office.   Medical Decision Making  [name] is a 72 y.o. 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   Based on vein mapping and examination, this patient's permanent access options include: would be graft, she has very small veins in both upper extremities  Cillian Gwinner [facility] PA-C [facility] [place] Office: [phone] Pager: [phone]  01/12/2013, 3:59 PM   History and exam details as above. The patient is right  handed. She is currently not on hemodialysis. Her veins are marginal and not acceptable for placement of an AV fistula. She would need an AV graft. I am reluctant to place a graft until we believe dialysis is imminent so that we are not potentially declotting this graft with the patient not currently on hemodialysis. The patient will discuss things further with Dr. Hinda Lenis that her next appointment. If he believes that dialysis is imminent we will go ahead and place an AV graft in her left arm. Risks benefits possible complications procedure details including but limited to bleeding infection graft thrombosis ischemic steal were explained to the patient and her daughter today. She understands and agrees to proceed.  Ruta Hinds, MD [facility]  Office: [phone] Pager: [phone]

## 2013-01-13 ENCOUNTER — Ambulatory Visit: Payer: Medicare Other | Admitting: Vascular Surgery

## 2013-01-19 ENCOUNTER — Other Ambulatory Visit: Payer: Self-pay

## 2013-01-24 ENCOUNTER — Ambulatory Visit (INDEPENDENT_AMBULATORY_CARE_PROVIDER_SITE_OTHER): Payer: Medicare Other | Admitting: Internal Medicine

## 2013-01-26 ENCOUNTER — Ambulatory Visit (INDEPENDENT_AMBULATORY_CARE_PROVIDER_SITE_OTHER): Payer: Medicare Other | Admitting: Internal Medicine

## 2013-01-26 ENCOUNTER — Other Ambulatory Visit (INDEPENDENT_AMBULATORY_CARE_PROVIDER_SITE_OTHER): Payer: Self-pay | Admitting: *Deleted

## 2013-01-26 ENCOUNTER — Telehealth (INDEPENDENT_AMBULATORY_CARE_PROVIDER_SITE_OTHER): Payer: Self-pay | Admitting: *Deleted

## 2013-01-26 ENCOUNTER — Encounter (INDEPENDENT_AMBULATORY_CARE_PROVIDER_SITE_OTHER): Payer: Self-pay | Admitting: Internal Medicine

## 2013-01-26 VITALS — BP 120/72 | HR 112 | Ht 67.0 in | Wt 168.4 lb

## 2013-01-26 DIAGNOSIS — Z1211 Encounter for screening for malignant neoplasm of colon: Secondary | ICD-10-CM

## 2013-01-26 DIAGNOSIS — D649 Anemia, unspecified: Secondary | ICD-10-CM | POA: Insufficient documentation

## 2013-01-26 NOTE — Progress Notes (Signed)
Subjective:     Patient ID: [name], female   DOB: 11-Oct-1940, 72 y.o.   MRN: [id number]  HPI Referred to our office by Dr. Hinda Lenis for anemia. She denies prior hx of anemia. Appetite is good. No weight loss. No abdominal pain. No acid reflux. She usually has a BM about every day.  No melena or bright red rectal bleeding. No family hx of colon cancer. Colonoscopy years ago by Dr. Tamala Julian and she reports was normal (screening).    Hx of Chronic kidney disease x 1 month (diagnosed).  12/16/2012 TIBC 254, UIBC 192, Iron 62, Iron Sat 24, Vitamin B12 206, Folate greater than 19.9, ferritin 113  11/22/2012 H and H 8.9 and 28.01, MCV 89, Platelet ct 263. 11/10/12 Hemoglobin 9.3 11/12/11 Hemoglobin 10.4. Hep C negative,    5children all in good health. Husband deceased.  She is retired from [employer].  Review of Systems  See hpi Current Outpatient Prescriptions  Medication Sig Dispense Refill  . Amlodipine-Valsartan-HCTZ 10-320-25 MG TABS Take 1 tablet by mouth daily.      Marland Kitchen ibuprofen (ADVIL,MOTRIN) 200 MG tablet Take 400 mg by mouth every 6 (six) hours as needed for pain.      Marland Kitchen insulin aspart protamine-insulin aspart (NOVOLOG 70/30) (70-30) 100 UNIT/ML injection Inject 44-60 Units into the skin 2 (two) times daily with a meal. Patient takes 60 units in the morning and 44 units in the evening       No current facility-administered medications for this visit.   Past Medical History  Diagnosis Date  . Hypertension   . Cataract   . Diabetes mellitus   . Chronic kidney disease    Past Surgical History  Procedure Laterality Date  . No past surgeries    . Cataract extraction w/phaco  11/20/2011    Procedure: CATARACT EXTRACTION PHACO AND INTRAOCULAR LENS PLACEMENT (IOC);  Surgeon: Tonny Branch, MD;  Location: [facility];  Service: Ophthalmology;  Laterality: Right;  CDE 18.82  . Cataract extraction w/phaco Left 11/18/2012    Procedure: CATARACT EXTRACTION PHACO AND INTRAOCULAR LENS PLACEMENT  (IOC);  Surgeon: Tonny Branch, MD;  Location: [facility];  Service: Ophthalmology;  Laterality: Left;  CDE: 18.97   No Known Allergies       Objective:   Physical Exam  Filed Vitals:   01/26/13 1441  Height: 5\' 7"  (1.702 m)  Weight: 168 lb 6.4 oz (76.386 kg)   Alert and oriented. Skin warm and dry. Oral mucosa is moist.   . Sclera anicteric, conjunctivae is pink. Thyroid not enlarged. No cervical lymphadenopathy. Lungs clear. Heart regular rate and rhythm.  Abdomen is soft. Bowel sounds are positive. No hepatomegaly. No abdominal masses felt. No tenderness.  No edema to lower extremities. Stool brown and guaiac negative.     Assessment:    Anemia.  Iron studies are normal. Colonic neoplasm, AVM, polyp needs to be ruled out. Last colonoscopy in 2007 normal.     Plan:    Colonoscopy with Dr. Laural Golden.The risks and benefits such as perforation, bleeding, and infection were reviewed with the patient and is agreeable.

## 2013-01-26 NOTE — Telephone Encounter (Addendum)
Patient needs movi prep -- nkda 

## 2013-01-26 NOTE — Patient Instructions (Addendum)
Colonoscopy with Dr. Rehman. The risks and benefits such as perforation, bleeding, and infection were reviewed with the patient and is agreeable. 

## 2013-01-27 ENCOUNTER — Encounter (HOSPITAL_COMMUNITY): Payer: Self-pay | Admitting: Pharmacy Technician

## 2013-01-28 MED ORDER — PEG-KCL-NACL-NASULF-NA ASC-C 100 G PO SOLR: 1.0000 | Freq: Once | ORAL | Status: DC

## 2013-02-02 ENCOUNTER — Encounter (INDEPENDENT_AMBULATORY_CARE_PROVIDER_SITE_OTHER): Payer: Self-pay

## 2013-02-09 ENCOUNTER — Encounter (HOSPITAL_COMMUNITY)
Admission: RE | Disposition: A | Discharge status: Home / Self Care | Payer: Self-pay | Source: Ambulatory Visit | Attending: Internal Medicine

## 2013-02-09 ENCOUNTER — Ambulatory Visit (HOSPITAL_COMMUNITY)
Admission: RE | Admit: 2013-02-09 | Discharge: 2013-02-09 | Disposition: A | Discharge status: Home / Self Care | Payer: Medicare Other | Source: Ambulatory Visit | Attending: Internal Medicine | Admitting: Internal Medicine

## 2013-02-09 ENCOUNTER — Encounter (HOSPITAL_COMMUNITY): Payer: Self-pay | Admitting: *Deleted

## 2013-02-09 DIAGNOSIS — K644 Residual hemorrhoidal skin tags: Secondary | ICD-10-CM

## 2013-02-09 DIAGNOSIS — E119 Type 2 diabetes mellitus without complications: Secondary | ICD-10-CM | POA: Insufficient documentation

## 2013-02-09 DIAGNOSIS — K5730 Diverticulosis of large intestine without perforation or abscess without bleeding: Secondary | ICD-10-CM | POA: Insufficient documentation

## 2013-02-09 DIAGNOSIS — D126 Benign neoplasm of colon, unspecified: Secondary | ICD-10-CM | POA: Insufficient documentation

## 2013-02-09 DIAGNOSIS — Z01812 Encounter for preprocedural laboratory examination: Secondary | ICD-10-CM | POA: Insufficient documentation

## 2013-02-09 DIAGNOSIS — I10 Essential (primary) hypertension: Secondary | ICD-10-CM | POA: Insufficient documentation

## 2013-02-09 DIAGNOSIS — D649 Anemia, unspecified: Secondary | ICD-10-CM

## 2013-02-09 DIAGNOSIS — Z1211 Encounter for screening for malignant neoplasm of colon: Secondary | ICD-10-CM | POA: Insufficient documentation

## 2013-02-09 HISTORY — PX: COLONOSCOPY: SHX5424

## 2013-02-09 LAB — GLUCOSE, CAPILLARY: Glucose-Capillary: 220 mg/dL — ABNORMAL HIGH (ref 70–99)

## 2013-02-09 SURGERY — COLONOSCOPY
Anesthesia: Moderate Sedation

## 2013-02-09 MED ORDER — MEPERIDINE HCL 50 MG/ML IJ SOLN
INTRAMUSCULAR | Status: DC | PRN
  Administered 2013-02-09 (×2): 25 mg via INTRAVENOUS

## 2013-02-09 MED ORDER — FENTANYL CITRATE 0.05 MG/ML IJ SOLN: 25.0000 ug | INTRAMUSCULAR | Status: DC | PRN

## 2013-02-09 MED ORDER — MIDAZOLAM HCL 5 MG/5ML IJ SOLN
INTRAMUSCULAR | Status: AC
  Filled 2013-02-09: qty 10

## 2013-02-09 MED ORDER — MEPERIDINE HCL 50 MG/ML IJ SOLN
INTRAMUSCULAR | Status: AC
  Filled 2013-02-09: qty 1

## 2013-02-09 MED ORDER — SODIUM CHLORIDE 0.9 % IV SOLN
INTRAVENOUS | Status: DC
  Administered 2013-02-09: 1000 mL via INTRAVENOUS

## 2013-02-09 MED ORDER — ENALAPRILAT 1.25 MG/ML IV SOLN
INTRAVENOUS | Status: DC | PRN
  Administered 2013-02-09: 1.25 mg via INTRAVENOUS

## 2013-02-09 MED ORDER — ENALAPRILAT 1.25 MG/ML IV SOLN
INTRAVENOUS | Status: AC
  Filled 2013-02-09: qty 2

## 2013-02-09 MED ORDER — MIDAZOLAM HCL 5 MG/5ML IJ SOLN
INTRAMUSCULAR | Status: DC | PRN
  Administered 2013-02-09: 2 mg via INTRAVENOUS
  Administered 2013-02-09: 1 mg via INTRAVENOUS
  Administered 2013-02-09: 2 mg via INTRAVENOUS

## 2013-02-09 MED ORDER — ONDANSETRON HCL 4 MG/2ML IJ SOLN: 4.0000 mg | Freq: Once | INTRAMUSCULAR | Status: DC | PRN

## 2013-02-09 NOTE — Op Note (Signed)
COLONOSCOPY PROCEDURE REPORT  PATIENT:  [name]  MR#:  [id number] Birthdate:  04/23/1941, 72 y.o., female Endoscopist:  Dr. Rogene Houston, MD Referred By:  Dr. Peri Maris, MD  Procedure Date: 02/09/2013  Procedure:   Colonoscopy with snare polypectomy.  Indications:  Patient is 72 year old African female who is undergoing colonoscopy primarily for screening purposes. She has anemia with recent drop however there is no evidence of overt or occult GI bleed. Patient's last colonoscopy was over 7 years ago.  Informed Consent:  The procedure and risks were reviewed with the patient and informed consent was obtained.  Medications:  Demerol 50 mg IV Versed 5 mg IV  Vasotec A999333 mg IV for systolic hypertension.  Description of procedure:  After a digital rectal exam was performed, that colonoscope was advanced from the anus through the rectum and colon to the area of the cecum, ileocecal valve and appendiceal orifice. The cecum was deeply intubated. These structures were well-seen and photographed for the record. From the level of the cecum and ileocecal valve, the scope was slowly and cautiously withdrawn. The mucosal surfaces were carefully surveyed utilizing scope tip to flexion to facilitate fold flattening as needed. The scope was pulled down into the rectum where a thorough exam including retroflexion was performed.  Findings:   Prep excellent. Scattered diverticula throughout the colon but most of these were at sigmoid and descending colon. 10 mm polyp snared from splenic flexure and retrieved using the Roth net. Normal rectal mucosa. Small hemorrhoids below the dentate line.    Therapeutic/Diagnostic Maneuvers Performed:  See above  Complications:  None   Cecal Withdrawal Time:  20  minutes  Impression:  Examination performed to cecum. Pancolonic diverticulosis. 10 mm polyp snared from splenic flexure. Small external hemorrhoids.  Recommendations:   Standard instructions given. I will contact patient with biopsy results and further recommendations.  REHMAN,NAJEEB U  02/09/2013 3:49 PM  CC: Dr. [name], MD & Dr. Rayne Du ref. provider found

## 2013-02-09 NOTE — H&P (Addendum)
[name] is an 72 y.o. female.   Chief Complaint: Patient is here for colonoscopy. HPI: Patient is 72 year old African female who is undergoing colonoscopy for many for screening purposes. She has chronic kidney disease. She has chronic anemia but her hemoglobin dropped. But she denies melena or rectal bleeding. She has good appetite and weight and stable. Family she is negative for CRC. Her last colonoscopy was over 7  years ago.  Past Medical History  Diagnosis Date  . Hypertension   . Cataract   . Diabetes mellitus   . Chronic kidney disease     Past Surgical History  Procedure Laterality Date  . No past surgeries    . Cataract extraction w/phaco  11/20/2011    Procedure: CATARACT EXTRACTION PHACO AND INTRAOCULAR LENS PLACEMENT (IOC);  Surgeon: Tonny Branch, MD;  Location: [facility];  Service: Ophthalmology;  Laterality: Right;  CDE 18.82  . Cataract extraction w/phaco Left 11/18/2012    Procedure: CATARACT EXTRACTION PHACO AND INTRAOCULAR LENS PLACEMENT (IOC);  Surgeon: Tonny Branch, MD;  Location: [facility];  Service: Ophthalmology;  Laterality: Left;  CDE: 18.97    Family History  Problem Relation Age of Onset  . Anesthesia problems Neg Hx   . Hypotension Neg Hx   . Malignant hyperthermia Neg Hx   . Pseudochol deficiency Neg Hx   . Cancer Sister   . Cancer Brother    Social History:  reports that she has never smoked. She has never used smokeless tobacco. She reports that she does not drink alcohol or use illicit drugs.  Allergies: No Known Allergies  Medications Prior to Admission  Medication Sig Dispense Refill  . Amlodipine-Valsartan-HCTZ 10-320-25 MG TABS Take 1 tablet by mouth daily.      Marland Kitchen ibuprofen (ADVIL,MOTRIN) 200 MG tablet Take 400 mg by mouth every 6 (six) hours as needed for pain.      Marland Kitchen insulin aspart protamine-insulin aspart (NOVOLOG 70/30) (70-30) 100 UNIT/ML injection Inject 44-60 Units into the skin 2 (two) times daily with a meal. Patient takes 60 units in  the morning and 44 units in the evening      . peg 3350 powder (MOVIPREP) 100 G SOLR Take 1 kit (200 g total) by mouth once.  1 kit  0    Results for orders placed during the hospital encounter of 02/09/13 (from the past 48 hour(s))  GLUCOSE, CAPILLARY     Status: Abnormal   Collection Time    02/09/13  1:36 PM      Result Value Range   Glucose-Capillary 220 (*) 70 - 99 mg/dL   No results found.  ROS  Blood pressure 210/90, pulse 92, temperature 97.8 F (36.6 C), temperature source Oral, resp. rate 12, height 5\' 7"  (1.702 m), weight 168 lb (76.204 kg), SpO2 100.00%. Physical Exam  Constitutional: She appears well-developed and well-nourished.  HENT:  Mouth/Throat: Oropharynx is clear and moist.  Eyes: No scleral icterus.  Conjunctivae  pale  Neck: No thyromegaly present.  Cardiovascular: Normal rate, regular rhythm and normal heart sounds.   No murmur heard. Respiratory: Effort normal and breath sounds normal.  GI: Soft. She exhibits no distension and no mass. There is no tenderness.  Musculoskeletal: She exhibits no edema.  Lymphadenopathy:    She has no cervical adenopathy.  Neurological: She is alert.  Skin: Skin is warm.     Assessment/Plan Screening colonoscopy. Anemia possibly of chronic disease.  REHMAN,NAJEEB U 02/09/2013, 2:55 PM

## 2013-02-15 ENCOUNTER — Encounter: Payer: Self-pay | Admitting: Nephrology

## 2013-02-15 ENCOUNTER — Encounter (HOSPITAL_COMMUNITY): Payer: Self-pay | Admitting: Internal Medicine

## 2013-02-18 ENCOUNTER — Encounter (INDEPENDENT_AMBULATORY_CARE_PROVIDER_SITE_OTHER): Payer: Self-pay | Admitting: *Deleted

## 2013-04-21 ENCOUNTER — Other Ambulatory Visit: Payer: Self-pay

## 2013-06-22 ENCOUNTER — Encounter (HOSPITAL_COMMUNITY)
Admission: RE | Admit: 2013-06-22 | Discharge: 2013-06-22 | Disposition: A | Discharge status: Home / Self Care | Payer: Medicare Other | Source: Ambulatory Visit | Attending: Nephrology | Admitting: Nephrology

## 2013-06-22 ENCOUNTER — Other Ambulatory Visit: Payer: Self-pay

## 2013-06-22 DIAGNOSIS — N186 End stage renal disease: Secondary | ICD-10-CM | POA: Insufficient documentation

## 2013-06-22 DIAGNOSIS — N039 Chronic nephritic syndrome with unspecified morphologic changes: Principal | ICD-10-CM

## 2013-06-22 DIAGNOSIS — D631 Anemia in chronic kidney disease: Secondary | ICD-10-CM | POA: Insufficient documentation

## 2013-06-22 LAB — HEMOGLOBIN AND HEMATOCRIT, BLOOD
HCT: 23.8 % — ABNORMAL LOW (ref 36.0–46.0)
Hemoglobin: 7.7 g/dL — ABNORMAL LOW (ref 12.0–15.0)

## 2013-06-22 LAB — ABO/RH: ABO/RH(D): O POS

## 2013-06-22 LAB — PREPARE RBC (CROSSMATCH)

## 2013-06-23 ENCOUNTER — Encounter (HOSPITAL_COMMUNITY): Payer: Self-pay | Admitting: *Deleted

## 2013-06-23 ENCOUNTER — Other Ambulatory Visit (HOSPITAL_COMMUNITY): Payer: Self-pay | Admitting: *Deleted

## 2013-06-23 ENCOUNTER — Encounter (HOSPITAL_COMMUNITY)
Admission: RE | Admit: 2013-06-23 | Discharge: 2013-06-23 | Disposition: A | Discharge status: Home / Self Care | Payer: Medicare Other | Source: Ambulatory Visit | Attending: Nephrology | Admitting: Nephrology

## 2013-06-23 ENCOUNTER — Encounter (HOSPITAL_COMMUNITY): Payer: Self-pay | Admitting: Pharmacy Technician

## 2013-06-23 DIAGNOSIS — E1165 Type 2 diabetes mellitus with hyperglycemia: Secondary | ICD-10-CM | POA: Insufficient documentation

## 2013-06-23 LAB — GLUCOSE, CAPILLARY: Glucose-Capillary: 175 mg/dL — ABNORMAL HIGH (ref 70–99)

## 2013-06-23 MED ORDER — DEXTROSE 5 % IV SOLN
1.5000 g | INTRAVENOUS | Status: AC
  Administered 2013-06-24: 1.5 g via INTRAVENOUS
  Filled 2013-06-23: qty 1.5

## 2013-06-23 MED ORDER — SODIUM CHLORIDE 0.9 % IV SOLN
INTRAVENOUS | Status: DC
  Administered 2013-06-23: 08:00:00 via INTRAVENOUS

## 2013-06-23 NOTE — Progress Notes (Signed)
Pt's daughter, [name] verified health hx, allergies and meds. Gave her all the pre-op information.

## 2013-06-24 ENCOUNTER — Encounter (HOSPITAL_COMMUNITY)
Admission: RE | Disposition: A | Discharge status: Home / Self Care | Payer: Self-pay | Source: Ambulatory Visit | Attending: Surgery

## 2013-06-24 ENCOUNTER — Encounter (HOSPITAL_COMMUNITY): Payer: Medicare Other | Admitting: Anesthesiology

## 2013-06-24 ENCOUNTER — Encounter (HOSPITAL_COMMUNITY): Payer: Self-pay | Admitting: *Deleted

## 2013-06-24 ENCOUNTER — Ambulatory Visit (HOSPITAL_COMMUNITY): Payer: Medicare Other | Admitting: Anesthesiology

## 2013-06-24 ENCOUNTER — Ambulatory Visit (HOSPITAL_COMMUNITY): Payer: Medicare Other

## 2013-06-24 ENCOUNTER — Ambulatory Visit (HOSPITAL_COMMUNITY)
Admission: RE | Admit: 2013-06-24 | Discharge: 2013-06-24 | Disposition: A | Discharge status: Home / Self Care | Payer: Medicare Other | Source: Ambulatory Visit | Attending: Surgery | Admitting: Surgery

## 2013-06-24 DIAGNOSIS — N186 End stage renal disease: Secondary | ICD-10-CM

## 2013-06-24 DIAGNOSIS — Z794 Long term (current) use of insulin: Secondary | ICD-10-CM

## 2013-06-24 DIAGNOSIS — E119 Type 2 diabetes mellitus without complications: Secondary | ICD-10-CM

## 2013-06-24 DIAGNOSIS — I120 Hypertensive chronic kidney disease with stage 5 chronic kidney disease or end stage renal disease: Secondary | ICD-10-CM

## 2013-06-24 HISTORY — DX: Shortness of breath: R06.02

## 2013-06-24 HISTORY — DX: Anemia, unspecified: D64.9

## 2013-06-24 HISTORY — PX: INSERTION OF DIALYSIS CATHETER: SHX1324

## 2013-06-24 LAB — TYPE AND SCREEN
ABO/RH(D): O POS
Antibody Screen: NEGATIVE
UNIT DIVISION: 0
UNIT DIVISION: 0

## 2013-06-24 LAB — POCT I-STAT 4, (NA,K, GLUC, HGB,HCT)
Glucose, Bld: 219 mg/dL — ABNORMAL HIGH (ref 70–99)
HEMATOCRIT: 27 % — AB (ref 36.0–46.0)
Hemoglobin: 9.2 g/dL — ABNORMAL LOW (ref 12.0–15.0)
Potassium: 5 mEq/L (ref 3.7–5.3)
SODIUM: 140 meq/L (ref 137–147)

## 2013-06-24 LAB — GLUCOSE, CAPILLARY
Glucose-Capillary: 40 mg/dL — CL (ref 70–99)
Glucose-Capillary: 231 mg/dL — ABNORMAL HIGH (ref 70–99)

## 2013-06-24 SURGERY — INSERTION OF DIALYSIS CATHETER
Anesthesia: Monitor Anesthesia Care | Site: Neck | Laterality: Right

## 2013-06-24 MED ORDER — ONDANSETRON HCL 4 MG/2ML IJ SOLN
4.0000 mg | Freq: Once | INTRAMUSCULAR | Status: AC
  Administered 2013-06-24: 4 mg via INTRAVENOUS

## 2013-06-24 MED ORDER — HEPARIN SODIUM (PORCINE) 5000 UNIT/ML IJ SOLN
INTRAMUSCULAR | Status: DC | PRN
  Administered 2013-06-24: 07:00:00

## 2013-06-24 MED ORDER — FENTANYL CITRATE 0.05 MG/ML IJ SOLN
INTRAMUSCULAR | Status: DC | PRN
  Administered 2013-06-24 (×5): 25 ug via INTRAVENOUS

## 2013-06-24 MED ORDER — SODIUM CHLORIDE 0.9 % IV SOLN: INTRAVENOUS | Status: DC

## 2013-06-24 MED ORDER — HEPARIN SODIUM (PORCINE) 1000 UNIT/ML IJ SOLN
INTRAMUSCULAR | Status: AC
  Filled 2013-06-24: qty 1

## 2013-06-24 MED ORDER — SODIUM CHLORIDE 0.9 % IR SOLN
Status: DC | PRN
  Administered 2013-06-24: 1000 mL

## 2013-06-24 MED ORDER — ONDANSETRON HCL 4 MG/2ML IJ SOLN
INTRAMUSCULAR | Status: AC
  Filled 2013-06-24: qty 2

## 2013-06-24 MED ORDER — MIDAZOLAM HCL 5 MG/5ML IJ SOLN
INTRAMUSCULAR | Status: DC | PRN
  Administered 2013-06-24 (×2): 1 mg via INTRAVENOUS

## 2013-06-24 MED ORDER — FENTANYL CITRATE 0.05 MG/ML IJ SOLN: 25.0000 ug | INTRAMUSCULAR | Status: DC | PRN

## 2013-06-24 MED ORDER — HEPARIN SODIUM (PORCINE) 1000 UNIT/ML IJ SOLN
INTRAMUSCULAR | Status: DC | PRN
  Administered 2013-06-24: 4.6 mL

## 2013-06-24 MED ORDER — SODIUM CHLORIDE 0.9 % IV SOLN
INTRAVENOUS | Status: DC | PRN
  Administered 2013-06-24: 08:00:00 via INTRAVENOUS

## 2013-06-24 MED ORDER — LIDOCAINE-EPINEPHRINE (PF) 1 %-1:200000 IJ SOLN
INTRAMUSCULAR | Status: DC | PRN
  Administered 2013-06-24: 15 mL via INTRADERMAL

## 2013-06-24 SURGICAL SUPPLY — 45 items
BAG DECANTER FOR FLEXI CONT (MISCELLANEOUS) ×3 IMPLANT
CATH CANNON HEMO 15F 50CM (CATHETERS) IMPLANT
CATH CANNON HEMO 15FR 19 (HEMODIALYSIS SUPPLIES) IMPLANT
CATH CANNON HEMO 15FR 23CM (HEMODIALYSIS SUPPLIES) ×3 IMPLANT
CATH CANNON HEMO 15FR 31CM (HEMODIALYSIS SUPPLIES) IMPLANT
CATH CANNON HEMO 15FR 32CM (HEMODIALYSIS SUPPLIES) IMPLANT
COVER PROBE W GEL 5X96 (DRAPES) ×3 IMPLANT
COVER SURGICAL LIGHT HANDLE (MISCELLANEOUS) ×3 IMPLANT
DERMABOND ADHESIVE PROPEN (GAUZE/BANDAGES/DRESSINGS) ×2
DERMABOND ADVANCED (GAUZE/BANDAGES/DRESSINGS) ×2
DERMABOND ADVANCED .7 DNX12 (GAUZE/BANDAGES/DRESSINGS) ×1 IMPLANT
DERMABOND ADVANCED .7 DNX6 (GAUZE/BANDAGES/DRESSINGS) ×1 IMPLANT
DRAPE C-ARM 42X72 X-RAY (DRAPES) IMPLANT
DRAPE CHEST BREAST 15X10 FENES (DRAPES) ×3 IMPLANT
GAUZE SPONGE 2X2 8PLY NS (GAUZE/BANDAGES/DRESSINGS) ×3 IMPLANT
GAUZE SPONGE 2X2 8PLY STRL LF (GAUZE/BANDAGES/DRESSINGS) IMPLANT
GAUZE SPONGE 4X4 16PLY XRAY LF (GAUZE/BANDAGES/DRESSINGS) ×3 IMPLANT
GLOVE BIOGEL PI IND STRL 7.5 (GLOVE) ×1 IMPLANT
GLOVE BIOGEL PI INDICATOR 7.5 (GLOVE) ×2
GLOVE SURG SS PI 7.5 STRL IVOR (GLOVE) ×3 IMPLANT
GOWN STRL REUS W/ TWL LRG LVL3 (GOWN DISPOSABLE) ×2 IMPLANT
GOWN STRL REUS W/ TWL XL LVL3 (GOWN DISPOSABLE) ×1 IMPLANT
GOWN STRL REUS W/TWL LRG LVL3 (GOWN DISPOSABLE) ×4
GOWN STRL REUS W/TWL XL LVL3 (GOWN DISPOSABLE) ×2
KIT BASIN OR (CUSTOM PROCEDURE TRAY) ×3 IMPLANT
KIT ROOM TURNOVER OR (KITS) ×3 IMPLANT
NEEDLE 18GX1X1/2 (RX/OR ONLY) (NEEDLE) ×3 IMPLANT
NEEDLE HYPO 25GX1X1/2 BEV (NEEDLE) ×3 IMPLANT
NS IRRIG 1000ML POUR BTL (IV SOLUTION) ×3 IMPLANT
PACK SURGICAL SETUP 50X90 (CUSTOM PROCEDURE TRAY) ×3 IMPLANT
PAD ARMBOARD 7.5X6 YLW CONV (MISCELLANEOUS) ×6 IMPLANT
SET MICROPUNCTURE 5F STIFF (MISCELLANEOUS) ×3 IMPLANT
SOAP 2 % CHG 4 OZ (WOUND CARE) ×3 IMPLANT
SPONGE GAUZE 2X2 STER 10/PKG (GAUZE/BANDAGES/DRESSINGS)
SUT ETHILON 3 0 PS 1 (SUTURE) ×3 IMPLANT
SUT VICRYL 4-0 PS2 18IN ABS (SUTURE) ×3 IMPLANT
SYR 20CC LL (SYRINGE) ×6 IMPLANT
SYR 30ML LL (SYRINGE) IMPLANT
SYR 5ML LL (SYRINGE) ×3 IMPLANT
SYR CONTROL 10ML LL (SYRINGE) ×3 IMPLANT
SYRINGE 10CC LL (SYRINGE) ×3 IMPLANT
TAPE CLOTH SURG 4X10 WHT LF (GAUZE/BANDAGES/DRESSINGS) ×3 IMPLANT
TOWEL OR 17X24 6PK STRL BLUE (TOWEL DISPOSABLE) ×3 IMPLANT
TOWEL OR 17X26 10 PK STRL BLUE (TOWEL DISPOSABLE) ×3 IMPLANT
WATER STERILE IRR 1000ML POUR (IV SOLUTION) ×3 IMPLANT

## 2013-06-24 NOTE — Op Note (Signed)
Procedure: Ultrasound-guided insertion of Diatek catheter  Preoperative diagnosis: End-stage renal disease  Postoperative diagnosis: Same  Anesthesia: Local with IV sedation  Operative findings: 23 cm Diatek catheter right internal jugular vein  Operative details: After obtaining informed consent, the patient was taken to the operating room. The patient was placed in supine position on the operating room table. After adequate sedation the patient's entire neck and chest were prepped and draped in usual sterile fashion. The patient was placed in Trendelenburg position. Ultrasound was used to identify the patient's right internal jugular vein. This had normal compressibility and respiratory variation. Local anesthesia was infiltrated over the right jugular vein.  Using ultrasound guidance, the right internal jugular vein was successfully cannulated.  A 0.035 J-tipped guidewire was threaded into the right internal jugular vein and into the superior vena cava followed by the inferior vena cava under fluoroscopic guidance.   Next sequential 12 and 14 dilators were placed over the guidewire into the right atrium.  A 16 French dilator with a peel-away sheath was then placed over the guidewire into the right atrium.   The guidewire and dilator were removed. A 23 cm Diatek catheter was then placed through the peel away sheath into the right atrium.  The catheter was then tunneled subcutaneously, cut to length, and the hub attached. The catheter was noted to flush and draw easily. The catheter was inspected under fluoroscopy and found with its tip to be in the right atrium without any kinks throughout its course. The catheter was sutured to the skin with nylon sutures. The neck insertion site was closed with Vicryl stitch. The catheter was then loaded with concentrated Heparin solution. A dry sterile dressing was applied.  The patient tolerated procedure well and there were no complications. Instrument sponge and  needle counts correct in the case. The patient was taken to the recovery room in stable condition. Chest x-ray will be obtained in the recovery room.  Annamarie Major, MD [facility] Office: [phone] Pager: [phone]

## 2013-06-24 NOTE — Discharge Instructions (Signed)

## 2013-06-24 NOTE — Anesthesia Procedure Notes (Signed)
Procedure Name: MAC Date/Time: 06/24/2013 7:44 AM Performed by: Ned Grace Pre-anesthesia Checklist: Patient identified, Emergency Drugs available, Suction available, Patient being monitored and Timeout performed Patient Re-evaluated:Patient Re-evaluated prior to inductionOxygen Delivery Method: Simple face mask Preoxygenation: Pre-oxygenation with 100% oxygen Intubation Type: IV induction Placement Confirmation: positive ETCO2

## 2013-06-24 NOTE — Anesthesia Preprocedure Evaluation (Addendum)
Anesthesia Evaluation  Patient identified by MRN, date of birth, ID band Patient awake    Reviewed: Allergy & Precautions, H&P , NPO status , Patient's Chart, lab work & pertinent test results, reviewed documented beta blocker date and time   Airway Mallampati: I TM Distance: >3 FB Neck ROM: Full    Dental no notable dental hx. (+) Edentulous Upper, Edentulous Lower and Dental Advisory Given   Pulmonary neg pulmonary ROS,  breath sounds clear to auscultation  Pulmonary exam normal       Cardiovascular hypertension, On Medications and On Home Beta Blockers Rhythm:Regular Rate:Normal     Neuro/Psych negative neurological ROS  negative psych ROS   GI/Hepatic negative GI ROS, Neg liver ROS,   Endo/Other  diabetes, Type 1, Insulin Dependent  Renal/GU CRFRenal disease  negative genitourinary   Musculoskeletal   Abdominal   Peds  Hematology negative hematology ROS (+)   Anesthesia Other Findings   Reproductive/Obstetrics negative OB ROS                          Anesthesia Physical Anesthesia Plan  ASA: III  Anesthesia Plan: MAC   Post-op Pain Management:    Induction: Intravenous  Airway Management Planned: Simple Face Mask  Additional Equipment:   Intra-op Plan:   Post-operative Plan:   Informed Consent: I have reviewed the patients History and Physical, chart, labs and discussed the procedure including the risks, benefits and alternatives for the proposed anesthesia with the patient or authorized representative who has indicated his/her understanding and acceptance.   Dental advisory given  Plan Discussed with: CRNA  Anesthesia Plan Comments:         Anesthesia Quick Evaluation

## 2013-06-24 NOTE — Progress Notes (Signed)
Daughter states pt. Been having nausea and some vomiting for the past couple of days.States the renal doctor said its due to to kidneys.  Stated she threw up  A couple of teaspoons of yellow emesis. Pt. States she is feeling better now.

## 2013-06-24 NOTE — H&P (Signed)
Patient name: [name] MRN: [id number] DOB: 04-15-1941 Sex: female    No chief complaint on file.   HISTORY OF PRESENT ILLNESS: 73 yo in need of  HD access  Past Medical History  Diagnosis Date  . Hypertension   . Cataract   . Diabetes mellitus   . Chronic kidney disease   . Shortness of breath   . Anemia     Past Surgical History  Procedure Laterality Date  . No past surgeries    . Cataract extraction w/phaco  11/20/2011    Procedure: CATARACT EXTRACTION PHACO AND INTRAOCULAR LENS PLACEMENT (IOC);  Surgeon: Tonny Branch, MD;  Location: [facility];  Service: Ophthalmology;  Laterality: Right;  CDE 18.82  . Cataract extraction w/phaco Left 11/18/2012    Procedure: CATARACT EXTRACTION PHACO AND INTRAOCULAR LENS PLACEMENT (IOC);  Surgeon: Tonny Branch, MD;  Location: [facility];  Service: Ophthalmology;  Laterality: Left;  CDE: 18.97  . Colonoscopy N/A 02/09/2013    Procedure: COLONOSCOPY;  Surgeon: Rogene Houston, MD;  Location: [facility];  Service: Endoscopy;  Laterality: N/A;  305-moved to 220 Ann to notify pt    History   Social History  . Marital Status: Widowed    Spouse Name: N/A    Number of Children: N/A  . Years of Education: N/A   Occupational History  . Not on file.   Social History Main Topics  . Smoking status: Never Smoker   . Smokeless tobacco: Never Used  . Alcohol Use: No  . Drug Use: No  . Sexual Activity: Not on file   Other Topics Concern  . Not on file   Social History Narrative  . No narrative on file    Family History  Problem Relation Age of Onset  . Anesthesia problems Neg Hx   . Hypotension Neg Hx   . Malignant hyperthermia Neg Hx   . Pseudochol deficiency Neg Hx   . Cancer Sister   . Cancer Brother     Allergies as of 06/22/2013  . (No Known Allergies)    No current facility-administered medications on file prior to encounter.   Current Outpatient Prescriptions on File Prior to Encounter  Medication Sig Dispense  Refill  . insulin aspart protamine-insulin aspart (NOVOLOG 70/30) (70-30) 100 UNIT/ML injection Inject 44-60 Units into the skin 2 (two) times daily with a meal. Patient takes 60 units in the morning and 44 units in the evening      . ibuprofen (ADVIL,MOTRIN) 200 MG tablet Take 400 mg by mouth every 6 (six) hours as needed for pain.         REVIEW OF SYSTEMS: Cardiovascular: No chest pain, chest pressure, palpitations, orthopnea, or dyspnea on exertion. No claudication or rest pain,  No history of DVT or phlebitis. Pulmonary: No productive cough, asthma or wheezing. Neurologic: No weakness, paresthesias, aphasia, or amaurosis. No dizziness. Hematologic: No bleeding problems or clotting disorders. Musculoskeletal: No joint pain or joint swelling. Gastrointestinal: No blood in stool or hematemesis Genitourinary: No dysuria or hematuria. Psychiatric:: No history of major depression. Integumentary: No rashes or ulcers. Constitutional: No fever or chills.  PHYSICAL EXAMINATION:   Vital signs are BP 116/56  Pulse 91  Temp(Src) 98.1 F (36.7 C) (Oral)  Resp 18  Ht 5\' 7"  (1.702 m)  Wt 168 lb (76.204 kg)  BMI 26.31 kg/m2  SpO2 99% General: The patient appears their stated age. HEENT:  No gross abnormalities Pulmonary:  Non labored breathing Musculoskeletal:  There are no major deformities. Neurologic: No focal weakness or paresthesias are detected, Skin: There are no ulcer or rashes noted. Psychiatric: The patient has normal affect. Cardiovascular: There is a regular rate and rhythm without significant murmur appreciated.    Assessment: ESRD  Plan: Placement of tunnelled HD catheter  V. Leia Alf, M.D. [facility] Office: [phone] Pager:  [phone]

## 2013-06-24 NOTE — Preoperative (Signed)
Beta Blockers   Reason not to administer Beta Blockers:Not Applicable, took labetalol today.

## 2013-06-24 NOTE — Transfer of Care (Signed)
Immediate Anesthesia Transfer of Care Note  Patient: [name]  Procedure(s) Performed: Procedure(s): INSERTION OF DIALYSIS CATHETER: Ultrasound guided (Right)  Patient Location: PACU  Anesthesia Type:MAC  Level of Consciousness: awake, alert , oriented and patient cooperative  Airway & Oxygen Therapy: Patient Spontanous Breathing, on RA.  Post-op Assessment: Report given to PACU RN, Post -op Vital signs reviewed and stable and Patient moving all extremities X 4  Post vital signs: Reviewed and stable  Complications: No apparent anesthesia complications

## 2013-06-24 NOTE — Anesthesia Postprocedure Evaluation (Signed)
  Anesthesia Post-op Note  Patient: [name]  Procedure(s) Performed: Procedure(s): INSERTION OF DIALYSIS CATHETER: Ultrasound guided (Right)  Patient Location: PACU  Anesthesia Type: MAC  Level of Consciousness: awake and alert   Airway and Oxygen Therapy: Patient Spontanous Breathing  Post-op Pain: none  Post-op Assessment: Post-op Vital signs reviewed, Patient's Cardiovascular Status Stable and Respiratory Function Stable  Post-op Vital Signs: Reviewed  Filed Vitals:   06/24/13 0937  BP:   Pulse: 88  Temp: 36.2 C  Resp: 15    Complications: No apparent anesthesia complications

## 2013-06-25 ENCOUNTER — Encounter (HOSPITAL_COMMUNITY): Payer: Self-pay | Admitting: Emergency Medicine

## 2013-06-25 ENCOUNTER — Emergency Department (HOSPITAL_COMMUNITY): Payer: Medicare Other

## 2013-06-25 ENCOUNTER — Inpatient Hospital Stay (HOSPITAL_COMMUNITY)
Admission: EM | Admit: 2013-06-25 | Discharge: 2013-07-04 | DRG: 637 | Disposition: A | Discharge status: Home / Self Care | Payer: Medicare Other | Attending: Internal Medicine | Admitting: Internal Medicine

## 2013-06-25 DIAGNOSIS — Z992 Dependence on renal dialysis: Secondary | ICD-10-CM

## 2013-06-25 DIAGNOSIS — E119 Type 2 diabetes mellitus without complications: Secondary | ICD-10-CM

## 2013-06-25 DIAGNOSIS — E1149 Type 2 diabetes mellitus with other diabetic neurological complication: Secondary | ICD-10-CM | POA: Diagnosis present

## 2013-06-25 DIAGNOSIS — T426X5A Adverse effect of other antiepileptic and sedative-hypnotic drugs, initial encounter: Secondary | ICD-10-CM | POA: Diagnosis not present

## 2013-06-25 DIAGNOSIS — E162 Hypoglycemia, unspecified: Secondary | ICD-10-CM | POA: Diagnosis present

## 2013-06-25 DIAGNOSIS — N186 End stage renal disease: Secondary | ICD-10-CM | POA: Diagnosis present

## 2013-06-25 DIAGNOSIS — E1143 Type 2 diabetes mellitus with diabetic autonomic (poly)neuropathy: Secondary | ICD-10-CM | POA: Diagnosis present

## 2013-06-25 DIAGNOSIS — Z79899 Other long term (current) drug therapy: Secondary | ICD-10-CM

## 2013-06-25 DIAGNOSIS — D631 Anemia in chronic kidney disease: Secondary | ICD-10-CM | POA: Diagnosis present

## 2013-06-25 DIAGNOSIS — I498 Other specified cardiac arrhythmias: Secondary | ICD-10-CM

## 2013-06-25 DIAGNOSIS — E785 Hyperlipidemia, unspecified: Secondary | ICD-10-CM

## 2013-06-25 DIAGNOSIS — R404 Transient alteration of awareness: Secondary | ICD-10-CM | POA: Diagnosis not present

## 2013-06-25 DIAGNOSIS — D649 Anemia, unspecified: Secondary | ICD-10-CM

## 2013-06-25 DIAGNOSIS — G2581 Restless legs syndrome: Secondary | ICD-10-CM

## 2013-06-25 DIAGNOSIS — Z794 Long term (current) use of insulin: Secondary | ICD-10-CM

## 2013-06-25 DIAGNOSIS — T4275XA Adverse effect of unspecified antiepileptic and sedative-hypnotic drugs, initial encounter: Secondary | ICD-10-CM

## 2013-06-25 DIAGNOSIS — E1169 Type 2 diabetes mellitus with other specified complication: Principal | ICD-10-CM | POA: Diagnosis present

## 2013-06-25 DIAGNOSIS — R569 Unspecified convulsions: Secondary | ICD-10-CM | POA: Diagnosis present

## 2013-06-25 DIAGNOSIS — R112 Nausea with vomiting, unspecified: Secondary | ICD-10-CM | POA: Diagnosis present

## 2013-06-25 DIAGNOSIS — R609 Edema, unspecified: Secondary | ICD-10-CM

## 2013-06-25 DIAGNOSIS — I10 Essential (primary) hypertension: Secondary | ICD-10-CM

## 2013-06-25 DIAGNOSIS — K3184 Gastroparesis: Secondary | ICD-10-CM | POA: Diagnosis present

## 2013-06-25 DIAGNOSIS — I5030 Unspecified diastolic (congestive) heart failure: Secondary | ICD-10-CM | POA: Diagnosis present

## 2013-06-25 DIAGNOSIS — R778 Other specified abnormalities of plasma proteins: Secondary | ICD-10-CM

## 2013-06-25 DIAGNOSIS — G609 Hereditary and idiopathic neuropathy, unspecified: Secondary | ICD-10-CM

## 2013-06-25 DIAGNOSIS — I509 Heart failure, unspecified: Secondary | ICD-10-CM | POA: Diagnosis present

## 2013-06-25 DIAGNOSIS — I214 Non-ST elevation (NSTEMI) myocardial infarction: Secondary | ICD-10-CM | POA: Diagnosis present

## 2013-06-25 DIAGNOSIS — D509 Iron deficiency anemia, unspecified: Secondary | ICD-10-CM | POA: Diagnosis present

## 2013-06-25 DIAGNOSIS — N039 Chronic nephritic syndrome with unspecified morphologic changes: Secondary | ICD-10-CM

## 2013-06-25 DIAGNOSIS — R7989 Other specified abnormal findings of blood chemistry: Secondary | ICD-10-CM

## 2013-06-25 DIAGNOSIS — D7289 Other specified disorders of white blood cells: Secondary | ICD-10-CM

## 2013-06-25 DIAGNOSIS — I120 Hypertensive chronic kidney disease with stage 5 chronic kidney disease or end stage renal disease: Secondary | ICD-10-CM | POA: Diagnosis present

## 2013-06-25 LAB — GLUCOSE, CAPILLARY
GLUCOSE-CAPILLARY: 167 mg/dL — AB (ref 70–99)
Glucose-Capillary: 48 mg/dL — ABNORMAL LOW (ref 70–99)
Glucose-Capillary: 55 mg/dL — ABNORMAL LOW (ref 70–99)
Glucose-Capillary: 143 mg/dL — ABNORMAL HIGH (ref 70–99)

## 2013-06-25 LAB — COMPREHENSIVE METABOLIC PANEL
ALT: 41 U/L — ABNORMAL HIGH (ref 0–35)
AST: 34 U/L (ref 0–37)
Albumin: 3 g/dL — ABNORMAL LOW (ref 3.5–5.2)
Alkaline Phosphatase: 75 U/L (ref 39–117)
BILIRUBIN TOTAL: 0.4 mg/dL (ref 0.3–1.2)
BUN: 40 mg/dL — ABNORMAL HIGH (ref 6–23)
CALCIUM: 7.7 mg/dL — AB (ref 8.4–10.5)
CHLORIDE: 98 meq/L (ref 96–112)
CO2: 23 meq/L (ref 19–32)
CREATININE: 6.83 mg/dL — AB (ref 0.50–1.10)
GFR, EST AFRICAN AMERICAN: 6 mL/min — AB (ref >90)
GFR, EST NON AFRICAN AMERICAN: 5 mL/min — AB (ref >90)
GLUCOSE: 84 mg/dL (ref 70–99)
Potassium: 4.2 mEq/L (ref 3.7–5.3)
Sodium: 137 mEq/L (ref 137–147)
Total Protein: 7.4 g/dL (ref 6.0–8.3)

## 2013-06-25 LAB — POCT I-STAT, CHEM 8
BUN: 38 mg/dL — AB (ref 6–23)
CHLORIDE: 102 meq/L (ref 96–112)
Calcium, Ion: 1.02 mmol/L — ABNORMAL LOW (ref 1.13–1.30)
Creatinine, Ser: 6.9 mg/dL — ABNORMAL HIGH (ref 0.50–1.10)
GLUCOSE: 77 mg/dL (ref 70–99)
HCT: 29 % — ABNORMAL LOW (ref 36.0–46.0)
Hemoglobin: 9.9 g/dL — ABNORMAL LOW (ref 12.0–15.0)
POTASSIUM: 4.1 meq/L (ref 3.7–5.3)
Sodium: 138 mEq/L (ref 137–147)
TCO2: 23 mmol/L (ref 0–100)

## 2013-06-25 LAB — CBC WITH DIFFERENTIAL/PLATELET
BASOS ABS: 0 10*3/uL (ref 0.0–0.1)
Basophils Relative: 0 % (ref 0–1)
Eosinophils Absolute: 0 10*3/uL (ref 0.0–0.7)
Eosinophils Relative: 0 % (ref 0–5)
HEMATOCRIT: 26.1 % — AB (ref 36.0–46.0)
Hemoglobin: 8.9 g/dL — ABNORMAL LOW (ref 12.0–15.0)
LYMPHS PCT: 18 % (ref 12–46)
Lymphs Abs: 2 10*3/uL (ref 0.7–4.0)
MCH: 29.1 pg (ref 26.0–34.0)
MCHC: 34.1 g/dL (ref 30.0–36.0)
MCV: 85.3 fL (ref 78.0–100.0)
MONO ABS: 1.5 10*3/uL — AB (ref 0.1–1.0)
Monocytes Relative: 14 % — ABNORMAL HIGH (ref 3–12)
Neutro Abs: 7.7 10*3/uL (ref 1.7–7.7)
Neutrophils Relative %: 69 % (ref 43–77)
Platelets: 182 10*3/uL (ref 150–400)
RBC: 3.06 MIL/uL — ABNORMAL LOW (ref 3.87–5.11)
RDW: 14.2 % (ref 11.5–15.5)
WBC: 11.2 10*3/uL — AB (ref 4.0–10.5)

## 2013-06-25 LAB — URINALYSIS, DIPSTICK ONLY
BILIRUBIN URINE: NEGATIVE
Glucose, UA: NEGATIVE mg/dL
Ketones, ur: 15 mg/dL — AB
LEUKOCYTES UA: NEGATIVE
NITRITE: NEGATIVE
PH: 5 (ref 5.0–8.0)
Protein, ur: >300 mg/dL — AB
SPECIFIC GRAVITY, URINE: 1.024 (ref 1.005–1.030)
UROBILINOGEN UA: 0.2 mg/dL (ref 0.0–1.0)

## 2013-06-25 LAB — TROPONIN I: Troponin I: 1.5 ng/mL (ref <0.30)

## 2013-06-25 MED ORDER — OXYCODONE HCL 5 MG PO TABS
5.0000 mg | ORAL_TABLET | ORAL | Status: DC | PRN
  Administered 2013-06-25 – 2013-06-29 (×7): 5 mg via ORAL
  Filled 2013-06-25 (×8): qty 1

## 2013-06-25 MED ORDER — CALCIUM ACETATE 667 MG PO CAPS: 667.0000 mg | ORAL_CAPSULE | Freq: Three times a day (TID) | ORAL | Status: DC

## 2013-06-25 MED ORDER — SODIUM CHLORIDE 0.9 % IV SOLN
1.0000 g | Freq: Once | INTRAVENOUS | Status: AC
  Administered 2013-06-25: 1 g via INTRAVENOUS
  Filled 2013-06-25: qty 10

## 2013-06-25 MED ORDER — FUROSEMIDE 40 MG PO TABS
40.0000 mg | ORAL_TABLET | Freq: Two times a day (BID) | ORAL | Status: DC
  Administered 2013-06-26: 40 mg via ORAL
  Filled 2013-06-25 (×3): qty 1

## 2013-06-25 MED ORDER — ONDANSETRON HCL 4 MG/2ML IJ SOLN
4.0000 mg | Freq: Four times a day (QID) | INTRAMUSCULAR | Status: DC | PRN
  Administered 2013-06-26 – 2013-07-02 (×13): 4 mg via INTRAVENOUS
  Filled 2013-06-25 (×13): qty 2

## 2013-06-25 MED ORDER — HEPARIN SODIUM (PORCINE) 5000 UNIT/ML IJ SOLN
5000.0000 [IU] | Freq: Three times a day (TID) | INTRAMUSCULAR | Status: DC
  Administered 2013-06-25 – 2013-07-04 (×25): 5000 [IU] via SUBCUTANEOUS
  Filled 2013-06-25 (×29): qty 1

## 2013-06-25 MED ORDER — SODIUM CHLORIDE 0.9 % IV SOLN
1.0000 g | Freq: Once | INTRAVENOUS | Status: DC
  Filled 2013-06-25: qty 10

## 2013-06-25 MED ORDER — ZOLPIDEM TARTRATE 5 MG PO TABS
5.0000 mg | ORAL_TABLET | Freq: Every evening | ORAL | Status: DC | PRN
  Administered 2013-06-29: 5 mg via ORAL
  Filled 2013-06-25: qty 1

## 2013-06-25 MED ORDER — ACETAMINOPHEN 325 MG PO TABS: 650.0000 mg | ORAL_TABLET | Freq: Four times a day (QID) | ORAL | Status: DC | PRN

## 2013-06-25 MED ORDER — DEXTROSE 5 % IV SOLN
INTRAVENOUS | Status: DC
  Administered 2013-06-25: 1000 mL via INTRAVENOUS

## 2013-06-25 MED ORDER — MORPHINE SULFATE 2 MG/ML IJ SOLN
1.0000 mg | INTRAMUSCULAR | Status: DC | PRN
  Administered 2013-06-26 – 2013-07-02 (×7): 1 mg via INTRAVENOUS
  Filled 2013-06-25 (×7): qty 1

## 2013-06-25 MED ORDER — ONDANSETRON HCL 4 MG PO TABS
4.0000 mg | ORAL_TABLET | Freq: Four times a day (QID) | ORAL | Status: DC | PRN
  Administered 2013-06-28: 4 mg via ORAL
  Filled 2013-06-25: qty 1

## 2013-06-25 MED ORDER — MINOXIDIL 10 MG PO TABS
10.0000 mg | ORAL_TABLET | Freq: Every day | ORAL | Status: DC
  Administered 2013-06-26 – 2013-07-03 (×7): 10 mg via ORAL
  Filled 2013-06-25 (×10): qty 1

## 2013-06-25 MED ORDER — DEXTROSE 50 % IV SOLN
INTRAVENOUS | Status: AC
  Filled 2013-06-25: qty 50

## 2013-06-25 MED ORDER — LABETALOL HCL 200 MG PO TABS
200.0000 mg | ORAL_TABLET | Freq: Two times a day (BID) | ORAL | Status: DC
  Administered 2013-06-25 – 2013-07-03 (×13): 200 mg via ORAL
  Filled 2013-06-25 (×19): qty 1

## 2013-06-25 MED ORDER — CALCIUM CARBONATE ANTACID 500 MG PO CHEW
1.0000 | CHEWABLE_TABLET | Freq: Once | ORAL | Status: AC
  Administered 2013-06-25: 200 mg via ORAL
  Filled 2013-06-25: qty 1

## 2013-06-25 MED ORDER — DEXTROSE 50 % IV SOLN
1.0000 | Freq: Once | INTRAVENOUS | Status: AC
  Administered 2013-06-25: 50 mL via INTRAVENOUS

## 2013-06-25 MED ORDER — ASPIRIN EC 81 MG PO TBEC
81.0000 mg | DELAYED_RELEASE_TABLET | Freq: Every day | ORAL | Status: DC
  Administered 2013-06-25 – 2013-07-04 (×9): 81 mg via ORAL
  Filled 2013-06-25 (×10): qty 1

## 2013-06-25 MED ORDER — VITAMIN D3 25 MCG (1000 UNIT) PO TABS
1000.0000 [IU] | ORAL_TABLET | Freq: Every day | ORAL | Status: DC
  Administered 2013-06-26: 1000 [IU] via ORAL
  Filled 2013-06-25: qty 1

## 2013-06-25 MED ORDER — ACETAMINOPHEN 650 MG RE SUPP: 650.0000 mg | Freq: Four times a day (QID) | RECTAL | Status: DC | PRN

## 2013-06-25 MED ORDER — HEPARIN SODIUM (PORCINE) 5000 UNIT/ML IJ SOLN: 5000.0000 [IU] | Freq: Three times a day (TID) | INTRAMUSCULAR | Status: DC

## 2013-06-25 MED ORDER — INSULIN ASPART 100 UNIT/ML ~~LOC~~ SOLN
2.0000 [IU] | Freq: Three times a day (TID) | SUBCUTANEOUS | Status: DC
  Administered 2013-06-27: 2 [IU] via SUBCUTANEOUS

## 2013-06-25 NOTE — ED Notes (Addendum)
Pt had her first dialysis today and afterwards she began to shake all over her body and family reports the shaking has persisted since onset. Pt has not voided since yesterday. Pt is a&o, denies pain.

## 2013-06-25 NOTE — H&P (Signed)
[facility]                                                                                    Patient Demographics  [name], is a 73 y.o. female  CSN: [id number]  MRN: [id number]  DOB - 06/11/1941  Admit Date - 06/25/2013  Outpatient Primary MD for the patient is [name], MD   With History of -  Past Medical History  Diagnosis Date  . Hypertension   . Cataract   . Diabetes mellitus   . Chronic kidney disease   . Shortness of breath   . Anemia       Past Surgical History  Procedure Laterality Date  . No past surgeries    . Cataract extraction w/phaco  11/20/2011    Procedure: CATARACT EXTRACTION PHACO AND INTRAOCULAR LENS PLACEMENT (IOC);  Surgeon: Tonny Branch, MD;  Location: [facility];  Service: Ophthalmology;  Laterality: Right;  CDE 18.82  . Cataract extraction w/phaco Left 11/18/2012    Procedure: CATARACT EXTRACTION PHACO AND INTRAOCULAR LENS PLACEMENT (IOC);  Surgeon: Tonny Branch, MD;  Location: [facility];  Service: Ophthalmology;  Laterality: Left;  CDE: 18.97  . Colonoscopy N/A 02/09/2013    Procedure: COLONOSCOPY;  Surgeon: Rogene Houston, MD;  Location: [facility];  Service: Endoscopy;  Laterality: N/A;  305-moved to 220 Ann to notify pt    in for   Chief Complaint  Patient presents with  . Shaking     HPI  [name]  is a 73 y.o. female, with past medical history significant for diabetes , hypertension and end-stage renal disease who was recently started on dialysis presenting today with the acute onset of compulsions after she finished her first dialysis. No previous history of seizures or convulsions. In the emergency room she received the D50 water and by mouth juices for hypoglycemia with a blood sugar of 43, and was started on by mouth TUMS for hypocalcemia with a calcium of 1.02 . Denies chest pains or shortness of breath Her troponin was found to be elevated at 1.5 and EKG showed ST depressions in inferolateral leads. I  was called to admit.    Review of Systems    In addition to the HPI above,  No Fever-chills, No Headache, No changes with Vision or hearing, No problems swallowing food or Liquids, No Chest pain, Cough or Shortness of Breath, No Abdominal pain, No Nausea or Vommitting, Bowel movements are regular, No Blood in stool or Urine, No dysuria, No new skin rashes or bruises, No new joints pains-aches,  No new weakness, tingling, numbness in any extremity, No recent weight gain or loss, No polyuria, polydypsia or polyphagia, No significant Mental Stressors.  A full 10 point Review of Systems was done, except as stated above, all other Review of Systems were negative.   Social History History  Substance Use Topics  . Smoking status: Never Smoker   . Smokeless tobacco: Never Used  . Alcohol Use: No     Family History Family History  Problem Relation Age of Onset  . Anesthesia problems Neg Hx   . Hypotension Neg Hx   . Malignant hyperthermia  Neg Hx   . Pseudochol deficiency Neg Hx   . Cancer Sister   . Cancer Brother      Prior to Admission medications   Medication Sig Start Date End Date Taking? Authorizing Provider  cholecalciferol (VITAMIN D) 1000 UNITS tablet Take 1,000 Units by mouth daily.   Yes Historical Provider, MD  Cyanocobalamin (VITAMIN B-12 IJ) Inject 1 application as directed every 30 (thirty) days.   Yes Historical Provider, MD  furosemide (LASIX) 40 MG tablet Take 40 mg by mouth 2 (two) times daily.   Yes Historical Provider, MD  ibuprofen (ADVIL,MOTRIN) 200 MG tablet Take 400 mg by mouth every 6 (six) hours as needed for pain.   Yes Historical Provider, MD  insulin aspart protamine-insulin aspart (NOVOLOG 70/30) (70-30) 100 UNIT/ML injection Inject 44-60 Units into the skin 2 (two) times daily with a meal. Patient takes 60 units in the morning and 44 units in the evening   Yes Historical Provider, MD  labetalol (NORMODYNE) 200 MG tablet Take 200 mg by mouth 2  (two) times daily.   Yes Historical Provider, MD  minoxidil (LONITEN) 10 MG tablet Take 10 mg by mouth daily.   Yes Historical Provider, MD    No Known Allergies  Physical Exam  Vitals  Blood pressure 127/71, pulse 101, temperature 98.5 F (36.9 C), temperature source Oral, resp. rate 16, SpO2 100.00%.   1. General African American female in no acute distress, jerky movements noted  2. Normal affect and insight, Not Suicidal or Homicidal, Awake Alert, Oriented X 3.  3. No F.N deficits, ALL C.Nerves Intact, Strength 5/5 all 4 extremities, Sensation intact all 4 extremities, Plantars down going. Positive chvostek sign .  4. Ears and Eyes appear Normal, Conjunctivae clear, PERRLA. Moist Oral Mucosa.  5. Supple Neck, No JVD, No cervical lymphadenopathy appriciated, No Carotid Bruits.  6. Symmetrical Chest wall movement, Good air movement bilaterally, CTAB. Right sided chest catheter tunneled noted  7. RRR, No Gallops, Rubs or Murmurs, No Parasternal Heave.  8. Positive Bowel Sounds, Abdomen Soft, Non tender, No organomegaly appriciated,No rebound -guarding or rigidity.  9.  No Cyanosis, Normal Skin Turgor, No Skin Rash or Bruise.  10. Good muscle tone,  joints appear normal , no effusions, Normal ROM.  11. No Palpable Lymph Nodes in Neck or Axillae    Data Review  CBC  Recent Labs Lab 06/22/13 1320 06/24/13 0640 06/25/13 1800 06/25/13 1816  WBC  --   --  11.2*  --   HGB 7.7* 9.2* 8.9* 9.9*  HCT 23.8* 27.0* 26.1* 29.0*  PLT  --   --  182  --   MCV  --   --  85.3  --   MCH  --   --  29.1  --   MCHC  --   --  34.1  --   RDW  --   --  14.2  --   LYMPHSABS  --   --  2.0  --   MONOABS  --   --  1.5*  --   EOSABS  --   --  0.0  --   BASOSABS  --   --  0.0  --    ------------------------------------------------------------------------------------------------------------------  Chemistries   Recent Labs Lab 06/24/13 0640 06/25/13 1800 06/25/13 1816  NA 140 137  138  K 5.0 4.2 4.1  CL  --  98 102  CO2  --  23  --   GLUCOSE 219* 84 77  BUN  --  40* 38*  CREATININE  --  6.83* 6.90*  CALCIUM  --  7.7*  --   AST  --  34  --   ALT  --  41*  --   ALKPHOS  --  75  --   BILITOT  --  0.4  --    ------------------------------------------------------------------------------------------------------------------  ---------------------------------------------------------------------------------------------------------------  Urinalysis    Component Value Date/Time   COLORURINE YELLOW 03/17/2009 [place] 03/17/2009 2225   LABSPEC 1.024 06/25/2013 1833   PHURINE 5.0 06/25/2013 1833   GLUCOSEU NEGATIVE 06/25/2013 1833   HGBUR SMALL* 06/25/2013 1833   BILIRUBINUR NEGATIVE 06/25/2013 1833   KETONESUR 15* 06/25/2013 1833   PROTEINUR >300* 06/25/2013 1833   UROBILINOGEN 0.2 06/25/2013 1833   NITRITE NEGATIVE 06/25/2013 1833   LEUKOCYTESUR NEGATIVE 06/25/2013 1833       My personal review of EKG: ST depressions in inferolateral leads    Assessment & Plan   1. convulsions; hypoglycemia and hypocalcemia related. 2. Hypocalcemia     Status phoslo by mouth     IV calcium 3. Hypoglycemia status post D50 water, orange juice    D5 water    Customize sliding scale 4. End stage renal disease on hemodialysis 5. Diabetes mellitus, hypertension, anemia. 6. Elevated troponin; patient is chest pain-free however EKG shows ST depressions in the inferolateral leads    Place in stepdown    Serial troponins    Echocardiogram    Aspirin    Consider cardiology consult    DVT Prophylaxis Heparin  AM Labs Ordered, also please review Full Orders  Family Communication: Admission, patients condition and plan of care including tests being ordered have been discussed with the patient  who indicates understanding and agrees with the plan and Code Status.  Code Status full  Disposition Plan: Home  Time spent in minutes : 34 minutes  Condition  fair

## 2013-06-25 NOTE — ED Provider Notes (Signed)
CSN: [id number]     Arrival date & time 06/25/13  1650 History   First MD Initiated Contact with Patient 06/25/13 1728     Chief Complaint  Patient presents with  . Shaking   (Consider location/radiation/quality/duration/timing/severity/associated sxs/prior Treatment) HPI Comments: 73 yo AA female presents to ER with cc of being "jittery".  Pt had HD for the first time today 1000-1215 at [facility] in [place].  Pt became jittery after HD.  Pt denies pain.  Pt is accompanied by daughter.  No report of slurred speech, facial droop, paralysis.  Pt did have global weakness.    Unclear if BG at home was checked.  On arrival to ER BG was 43.  Pt given PO orange juice and BG was 55.  Pt takes novalog 70/30 in AM and PM.    Pt has PMH of HTN, DM, CKD on HD, SOB, anemia.  Pt had right sided HD portacath placed 06/24/13 (yesterday).  CXR obtained.    Pt denies f/c, HA, CP, SOB, cough, n/v/d, abd pain, extremity pain.  Pt denies recent infection, trauma, or recent illness.    Patient is a 73 y.o. female presenting with neurologic complaint. The history is provided by the patient.  Neurologic Problem This is a new problem.    Past Medical History  Diagnosis Date  . Hypertension   . Cataract   . Diabetes mellitus   . Chronic kidney disease   . Shortness of breath   . Anemia    Past Surgical History  Procedure Laterality Date  . No past surgeries    . Cataract extraction w/phaco  11/20/2011    Procedure: CATARACT EXTRACTION PHACO AND INTRAOCULAR LENS PLACEMENT (IOC);  Surgeon: Tonny Branch, MD;  Location: [facility] ORS;  Service: Ophthalmology;  Laterality: Right;  CDE 18.82  . Cataract extraction w/phaco Left 11/18/2012    Procedure: CATARACT EXTRACTION PHACO AND INTRAOCULAR LENS PLACEMENT (IOC);  Surgeon: Tonny Branch, MD;  Location: [facility] ORS;  Service: Ophthalmology;  Laterality: Left;  CDE: 18.97  . Colonoscopy N/A 02/09/2013    Procedure: COLONOSCOPY;  Surgeon: Rogene Houston, MD;  Location: [facility] ENDO SUITE;   Service: Endoscopy;  Laterality: N/A;  305-moved to 220 Ann to notify pt   Family History  Problem Relation Age of Onset  . Anesthesia problems Neg Hx   . Hypotension Neg Hx   . Malignant hyperthermia Neg Hx   . Pseudochol deficiency Neg Hx   . Cancer Sister   . Cancer Brother    History  Substance Use Topics  . Smoking status: Never Smoker   . Smokeless tobacco: Never Used  . Alcohol Use: No   OB History   Grav Para Term Preterm Abortions TAB SAB Ect Mult Living                 Review of Systems  Allergies  Review of patient's allergies indicates no known allergies.  Home Medications   Current Outpatient Rx  Name  Route  Sig  Dispense  Refill  . cholecalciferol (VITAMIN D) 1000 UNITS tablet   Oral   Take 1,000 Units by mouth daily.         . Cyanocobalamin (VITAMIN B-12 IJ)   Injection   Inject 1 application as directed every 30 (thirty) days.         . furosemide (LASIX) 40 MG tablet   Oral   Take 40 mg by mouth 2 (two) times daily.         Marland Kitchen  ibuprofen (ADVIL,MOTRIN) 200 MG tablet   Oral   Take 400 mg by mouth every 6 (six) hours as needed for pain.         Marland Kitchen insulin aspart protamine-insulin aspart (NOVOLOG 70/30) (70-30) 100 UNIT/ML injection   Subcutaneous   Inject 44-60 Units into the skin 2 (two) times daily with a meal. Patient takes 60 units in the morning and 44 units in the evening         . labetalol (NORMODYNE) 200 MG tablet   Oral   Take 200 mg by mouth 2 (two) times daily.         . minoxidil (LONITEN) 10 MG tablet   Oral   Take 10 mg by mouth daily.          BP 115/64  Pulse 97  Temp(Src) 98.5 F (36.9 C) (Oral)  Resp 19  SpO2 100% Physical Exam  Vitals reviewed. Constitutional: She appears well-developed and well-nourished.  HENT:  Head: Normocephalic and atraumatic.  Right Ear: External ear normal.  Left Ear: External ear normal.  Nose: Nose normal.  Mouth/Throat: Oropharynx is clear and moist.  Eyes:  Conjunctivae and EOM are normal. Pupils are equal, round, and reactive to light. Right eye exhibits no discharge. Left eye exhibits no discharge. No scleral icterus.  Neck: Normal range of motion. Neck supple. No JVD present. No thyromegaly present.  Cardiovascular: Normal rate and regular rhythm.   Pulmonary/Chest: Effort normal and breath sounds normal. No stridor. No respiratory distress. She has no wheezes. She has no rales. She exhibits no tenderness.  Abdominal: Soft. Bowel sounds are normal. She exhibits no distension and no mass. There is no tenderness. There is no rebound and no guarding.  Musculoskeletal: Normal range of motion. She exhibits no edema and no tenderness.  Neurological: She has normal strength. She is unresponsive. She is not disoriented. GCS eye subscore is 4. GCS verbal subscore is 5. GCS motor subscore is 6.  Periods of bilat upper extremity jittering every few seconds.  MAE, follows commands.    Questionable Chvostek's sign; neg Trousseaus sign.    ED Course  Procedures (including critical care time) Labs Review Labs Reviewed  GLUCOSE, CAPILLARY - Abnormal; Notable for the following:    Glucose-Capillary 48 (*)    All other components within normal limits  CBC WITH DIFFERENTIAL - Abnormal; Notable for the following:    WBC 11.2 (*)    RBC 3.06 (*)    Hemoglobin 8.9 (*)    HCT 26.1 (*)    Monocytes Relative 14 (*)    Monocytes Absolute 1.5 (*)    All other components within normal limits  COMPREHENSIVE METABOLIC PANEL - Abnormal; Notable for the following:    BUN 40 (*)    Creatinine, Ser 6.83 (*)    Calcium 7.7 (*)    Albumin 3.0 (*)    ALT 41 (*)    GFR calc non Af Amer 5 (*)    GFR calc Af Amer 6 (*)    All other components within normal limits  TROPONIN I - Abnormal; Notable for the following:    Troponin I 1.50 (*)    All other components within normal limits  URINALYSIS, DIPSTICK ONLY - Abnormal; Notable for the following:    Hgb urine dipstick  SMALL (*)    Ketones, ur 15 (*)    Protein, ur >300 (*)    All other components within normal limits  GLUCOSE, CAPILLARY - Abnormal; Notable for the following:  Glucose-Capillary 55 (*)    All other components within normal limits  GLUCOSE, CAPILLARY - Abnormal; Notable for the following:    Glucose-Capillary 167 (*)    All other components within normal limits  POCT I-STAT, CHEM 8 - Abnormal; Notable for the following:    BUN 38 (*)    Creatinine, Ser 6.90 (*)    Calcium, Ion 1.02 (*)    Hemoglobin 9.9 (*)    HCT 29.0 (*)    All other components within normal limits  CALCIUM, IONIZED   Imaging Review  June 24, 2013: Dg Chest 2 View  06/24/2013   CLINICAL DATA:  Hypertension, preop dialysis catheter placement.  EXAM: CHEST  2 VIEW  COMPARISON:  August 16, 2009  FINDINGS: The heart size and mediastinal contours are within normal limits. The aorta is slightly uncoiled. Both lungs are clear. There is no focal infiltrate, pulmonary edema, or pleural effusions. The visualized skeletal structures are stable.  IMPRESSION: No active cardiopulmonary disease.   Electronically Signed   By: Abelardo Diesel M.D.   On: 06/24/2013 07:30   Ct Head Wo Contrast  06/25/2013   CLINICAL DATA:  73 year old female with generalized weakness and persistent shaking.  EXAM: CT HEAD WITHOUT CONTRAST  TECHNIQUE: Contiguous axial images were obtained from the base of the skull through the vertex without intravenous contrast.  COMPARISON:  08/16/2009 head CT  FINDINGS: No acute intracranial abnormalities are identified, including mass lesion or mass effect, hydrocephalus, extra-axial fluid collection, midline shift, hemorrhage, or acute infarction.  Bilateral basal ganglia calcifications are again noted.  The visualized bony calvarium is unremarkable.  IMPRESSION: No evidence of acute intracranial abnormality.   Electronically Signed   By: Hassan Rowan M.D.   On: 06/25/2013 19:18   Dg Chest Port 1 View  06/24/2013    CLINICAL DATA:  Postoperative study, rule out pneumothorax  EXAM: PORTABLE CHEST - 1 VIEW  COMPARISON:  PA and lateral chest x-ray of earlier today  FINDINGS: The patient has undergone interval placement of a large caliber dual-lumen dialysis type catheter via the right internal jugular approach. The tip of the catheter lies in the region of the junction of the SVC with the right atrium. There is no evidence of a postprocedure pneumothorax or hemothorax. Both lungs are clear. The cardiopericardial silhouette is mildly enlarged. The pulmonary vascularity is not engorged.  IMPRESSION: There is no evidence of a postprocedure complication following placement of the dialysis type catheter via the right internal jugular approach.   Electronically Signed   By: Taraya Steward  [place]   On: 06/24/2013 09:16    Date: 06/25/2013 @ 1710  Rate: 102  Rhythm: sinus tachycardia PVCs  QRS Axis: normal  Intervals: normal  ST/T Wave abnormalities: nonspecific ST changes  Conduction Disutrbances:none  Narrative Interpretation: ST with nonspecific ST changes, wandering baseline  Old EKG Reviewed: changes noted  Results for orders placed during the hospital encounter of 06/25/13  GLUCOSE, CAPILLARY      Result Value Range   Glucose-Capillary 48 (*) 70 - 99 mg/dL   Comment 1 Documented in Chart     Comment 2 Notify RN    CBC WITH DIFFERENTIAL      Result Value Range   WBC 11.2 (*) 4.0 - 10.5 K/uL   RBC 3.06 (*) 3.87 - 5.11 MIL/uL   Hemoglobin 8.9 (*) 12.0 - 15.0 g/dL   HCT 26.1 (*) 36.0 - 46.0 %   MCV 85.3  78.0 - 100.0 fL   MCH  29.1  26.0 - 34.0 pg   MCHC 34.1  30.0 - 36.0 g/dL   RDW 14.2  11.5 - 15.5 %   Platelets 182  150 - 400 K/uL   Neutrophils Relative % 69  43 - 77 %   Neutro Abs 7.7  1.7 - 7.7 K/uL   Lymphocytes Relative 18  12 - 46 %   Lymphs Abs 2.0  0.7 - 4.0 K/uL   Monocytes Relative 14 (*) 3 - 12 %   Monocytes Absolute 1.5 (*) 0.1 - 1.0 K/uL   Eosinophils Relative 0  0 - 5 %   Eosinophils Absolute  0.0  0.0 - 0.7 K/uL   Basophils Relative 0  0 - 1 %   Basophils Absolute 0.0  0.0 - 0.1 K/uL  COMPREHENSIVE METABOLIC PANEL      Result Value Range   Sodium 137  137 - 147 mEq/L   Potassium 4.2  3.7 - 5.3 mEq/L   Chloride 98  96 - 112 mEq/L   CO2 23  19 - 32 mEq/L   Glucose, Bld 84  70 - 99 mg/dL   BUN 40 (*) 6 - 23 mg/dL   Creatinine, Ser 6.83 (*) 0.50 - 1.10 mg/dL   Calcium 7.7 (*) 8.4 - 10.5 mg/dL   Total Protein 7.4  6.0 - 8.3 g/dL   Albumin 3.0 (*) 3.5 - 5.2 g/dL   AST 34  0 - 37 U/L   ALT 41 (*) 0 - 35 U/L   Alkaline Phosphatase 75  39 - 117 U/L   Total Bilirubin 0.4  0.3 - 1.2 mg/dL   GFR calc non Af Amer 5 (*) >90 mL/min   GFR calc Af Amer 6 (*) >90 mL/min  TROPONIN I      Result Value Range   Troponin I 1.50 (*) <0.30 ng/mL  URINALYSIS, DIPSTICK ONLY      Result Value Range   Specific Gravity, Urine 1.024  1.005 - 1.030   pH 5.0  5.0 - 8.0   Glucose, UA NEGATIVE  NEGATIVE mg/dL   Hgb urine dipstick SMALL (*) NEGATIVE   Bilirubin Urine NEGATIVE  NEGATIVE   Ketones, ur 15 (*) NEGATIVE mg/dL   Protein, ur >300 (*) NEGATIVE mg/dL   Urobilinogen, UA 0.2  0.0 - 1.0 mg/dL   Nitrite NEGATIVE  NEGATIVE   Leukocytes, UA NEGATIVE  NEGATIVE  GLUCOSE, CAPILLARY      Result Value Range   Glucose-Capillary 55 (*) 70 - 99 mg/dL  GLUCOSE, CAPILLARY      Result Value Range   Glucose-Capillary 167 (*) 70 - 99 mg/dL  POCT I-STAT, CHEM 8      Result Value Range   Sodium 138  137 - 147 mEq/L   Potassium 4.1  3.7 - 5.3 mEq/L   Chloride 102  96 - 112 mEq/L   BUN 38 (*) 6 - 23 mg/dL   Creatinine, Ser 6.90 (*) 0.50 - 1.10 mg/dL   Glucose, Bld 77  70 - 99 mg/dL   Calcium, Ion 1.02 (*) 1.13 - 1.30 mmol/L   TCO2 23  0 - 100 mmol/L   Hemoglobin 9.9 (*) 12.0 - 15.0 g/dL   HCT 29.0 (*) 36.0 - 46.0 %      MDM   1. End stage renal disease   2. Anemia   3. Convulsions   4. Hypocalcemia   5. Elevated troponin I level    72 year old African female presents emergency department  with chief  complaint of "jittering". Patient has multiple medical problems to include insulin dependent diabetes mellitus, hypertension, end-stage renal disease for which she received her first session of hemodialysis today. Patient has a right-sided chest catheter in place through which she receives hemodialysis. Hemodialysis was today from 10:30 to 12:15. Upon completion of hemodialysis when the patient went home the jittering began. Patient is otherwise asymptomatic and denies headache, neck pain, chest pain, shortness of breath, abdominal pain, nausea vomiting diarrhea, urinary symptoms, extremity pain, slurred speech, facial droop, or paralysis. She does have global weakness.  ER course: Upon arrival patient was noted to be profoundly hypoglycemic with a blood glucose of 44. By mouth and IV glucose were given and her blood glucose responded accordingly. Patient was also noted to have a rhinitis calcium level of 1.02 on i-STAT chem 8 and a total calcium level of 7.7 on a CMP. CT head was negative. Troponin was significantly elevated at 1.5. EKG revealed nonspecific ST segment changes in the inferior leads however there was a wandering baseline secondary to persistent jittering.  BUN/Cr 40/6.83.    Replaced calcium with oral calcium and glucose with oral and IV glucose/dextrose.    I had a lengthy discussion with admitting physician, Dr. Laren Everts and I relayed ER workup and findings to include elevated troponin, EKG changes, hypoglycemia, hypocalcemia, and elevated renal function. Joint decision to hold on unfractionated heparin or low molecular weight heparin. Patient has been chest pain-free since onset of her symptoms. I will consult cardiology so that they are where the patient and can provide clinical recommendations.  Will obtain repeat EKG.     Repeat EKG:   Date: 06/25/2013 @ 1944   Rate: 99  Rhythm: normal sinus rhythm  QRS Axis: normal  Intervals: normal  ST/T Wave abnormalities:  nonspecific ST changes  Conduction Disutrbances:none  Narrative Interpretation:   Old EKG Reviewed: none available     8:11 PM Filed Vitals:   06/25/13 1945  BP: 115/64  Pulse: 97  Temp:   Resp: 19   Pt is symptom free in nad.    CRITICAL CARE Performed by: Curly Rim, Zaden Sako   Total critical care time: 45  Critical care time was exclusive of separately billable procedures and treating other patients.  Critical care was necessary to treat or prevent imminent or life-threatening deterioration.  Critical care was time spent personally by me on the following activities: development of treatment plan with patient and/or surrogate as well as nursing, discussions with consultants, evaluation of patient's response to treatment, examination of patient, obtaining history from patient or surrogate, ordering and performing treatments and interventions, ordering and review of laboratory studies, ordering and review of radiographic studies, pulse oximetry and re-evaluation of patient's condition.  Case d/w cardiology Dr.Hochrein who reviewed EKGs and felt that the findings were nonspecific. He also feels that troponin is elevated based on her renal function and is in agreement with Dr. Laren Everts and me.  No indication for unfractionated heparin or low molecular weight heparin at this time. Plan to admit patient to step down unit and trend troponin and EKGs.  Pt remains chest pain free.    Elmer Sow, MD 06/25/13 2012

## 2013-06-25 NOTE — ED Notes (Signed)
Pt presents with moderate uncontrolled movement of whole body. Pt denies any other symptoms other than generalized weakness. Pt has not voided since yesterday morning, which is abnormal for her. Pt has never experienced this shaking before.

## 2013-06-25 NOTE — ED Notes (Signed)
CBG is 48. Notified Nurse Clayton.

## 2013-06-26 ENCOUNTER — Encounter (HOSPITAL_COMMUNITY): Payer: Self-pay | Admitting: *Deleted

## 2013-06-26 DIAGNOSIS — I369 Nonrheumatic tricuspid valve disorder, unspecified: Secondary | ICD-10-CM

## 2013-06-26 LAB — COMPREHENSIVE METABOLIC PANEL
ALT: 34 U/L (ref 0–35)
AST: 28 U/L (ref 0–37)
Albumin: 2.6 g/dL — ABNORMAL LOW (ref 3.5–5.2)
Alkaline Phosphatase: 66 U/L (ref 39–117)
BUN: 41 mg/dL — ABNORMAL HIGH (ref 6–23)
CO2: 23 meq/L (ref 19–32)
Calcium: 7.9 mg/dL — ABNORMAL LOW (ref 8.4–10.5)
Chloride: 98 mEq/L (ref 96–112)
Creatinine, Ser: 7.3 mg/dL — ABNORMAL HIGH (ref 0.50–1.10)
GFR calc Af Amer: 6 mL/min — ABNORMAL LOW (ref >90)
GFR, EST NON AFRICAN AMERICAN: 5 mL/min — AB (ref >90)
Glucose, Bld: 93 mg/dL (ref 70–99)
Potassium: 4.4 mEq/L (ref 3.7–5.3)
SODIUM: 136 meq/L — AB (ref 137–147)
Total Bilirubin: 0.4 mg/dL (ref 0.3–1.2)
Total Protein: 6.2 g/dL (ref 6.0–8.3)

## 2013-06-26 LAB — GLUCOSE, CAPILLARY
GLUCOSE-CAPILLARY: 117 mg/dL — AB (ref 70–99)
GLUCOSE-CAPILLARY: 120 mg/dL — AB (ref 70–99)
GLUCOSE-CAPILLARY: 156 mg/dL — AB (ref 70–99)
Glucose-Capillary: 211 mg/dL — ABNORMAL HIGH (ref 70–99)

## 2013-06-26 LAB — MRSA PCR SCREENING: MRSA BY PCR: NEGATIVE

## 2013-06-26 LAB — CK: Total CK: 227 U/L — ABNORMAL HIGH (ref 7–177)

## 2013-06-26 LAB — TROPONIN I
TROPONIN I: 0.95 ng/mL — AB (ref <0.30)
Troponin I: 0.89 ng/mL (ref <0.30)

## 2013-06-26 LAB — PHOSPHORUS: Phosphorus: 5.1 mg/dL — ABNORMAL HIGH (ref 2.3–4.6)

## 2013-06-26 LAB — CALCIUM, IONIZED: Calcium, Ion: 1.03 mmol/L — ABNORMAL LOW (ref 1.13–1.30)

## 2013-06-26 LAB — MAGNESIUM: MAGNESIUM: 1.9 mg/dL (ref 1.5–2.5)

## 2013-06-26 LAB — LIPASE, BLOOD: Lipase: 25 U/L (ref 11–59)

## 2013-06-26 MED ORDER — CALCITRIOL 0.5 MCG PO CAPS
0.5000 ug | ORAL_CAPSULE | Freq: Every day | ORAL | Status: DC
  Administered 2013-06-26 – 2013-07-03 (×7): 0.5 ug via ORAL
  Filled 2013-06-26 (×10): qty 1

## 2013-06-26 MED ORDER — SODIUM CHLORIDE 0.9 % IV BOLUS (SEPSIS)
250.0000 mL | Freq: Once | INTRAVENOUS | Status: AC
  Administered 2013-06-26: 250 mL via INTRAVENOUS

## 2013-06-26 MED ORDER — CALCIUM CARBONATE ANTACID 500 MG PO CHEW
400.0000 mg | CHEWABLE_TABLET | Freq: Three times a day (TID) | ORAL | Status: DC
  Administered 2013-06-27: 400 mg via ORAL
  Filled 2013-06-26 (×5): qty 2

## 2013-06-26 NOTE — Progress Notes (Signed)
Progress Note [facility] TEAM 1 - Stepdown/ICU TEAM   [name] [id number] DOB: 1940/12/30 DOA: 06/25/2013 PCP: [name], MD  Admit HPI / Brief Narrative: 73 y.o. Female with past medical history significant for diabetes , hypertension and end-stage renal disease who was recently started on dialysis who presented with the acute onset of convulsions after she finished her first dialysis. No previous history of seizures or convulsions. In the emergency room she received D50 water and juice for hypoglycemia with a blood sugar of 43, and was started on TUMS for hypocalcemia with a calcium of 1.02.  Her troponin was found to be elevated at 1.5 and and EKG showed ST depressions in inferolateral leads.  HPI/Subjective: The patient is alert oriented and conversant.  She complains of epigastric pain and some right flank pain.  She denies substernal chest pressure or shortness of breath.  She displays intermittent jerking movements of her neck and arms.  These movements are not consistent with seizures in appearance.  Her mentation is not affected during these episodes.  Assessment/Plan:  Hypoglycemia Resolved - CBG 43 at time of presentation to ER - cont to follow trend   Moderate Hypocalcemia Ionized Ca low at 1.03, but not severely low - corrected Ca is actually ~9 given low albumin of 2.6 - continue with oral replacement - follow levels - check Mg and Phos   Jerking movements Unclear etiology - I do not believe her Ca++ is low enough to be the causing these movements - pt reports they started immediately after her first HD tx yesterday - check Mg and Phos - follow clinically - normalize Ca++ - BUN does not appear high enough to blame this on uremia  Elevated troponin Peaked at 1.50 - now trending downward - no evidence of STEMI on EKG - repeat EKG in am - follow troponin to normalization - check TTE to r/o focal WMA   ESRD Started HD on 1/10 ([facility] in [place]) - reports is  supposed to be on a M/W/F routine - no acute indication for HD at present - VM left on Nephrology line to alert them of HD pt   Anemia of chronic kidney disease No evidence of acute blood loss - follow Hgb trend   DM No further hypoglycemia - CBG currently reasonably controlled  HTN Currently reasonably controlled  Code Status: FULL Family Communication: Spoke with patient, daughter, and grandson at bedside Disposition Plan: SDU  Consultants: Nephrology for 1/12  Procedures: 1/9 - Diatek catheter right internal jugular vein at [facility]  Antibiotics: none  DVT prophylaxis: SCDs  Objective: Blood pressure 115/52, pulse 86, temperature 87.9 F (31.1 C), temperature source Oral, resp. rate 14, height 6' (1.829 m), weight 92.5 kg (203 lb 14.8 oz), SpO2 98.00%.  Intake/Output Summary (Last 24 hours) at 06/26/13 1534 Last data filed at 06/26/13 1300  Gross per 24 hour  Intake    640 ml  Output      0 ml  Net    640 ml   Exam: General: No acute respiratory distress Lungs: Clear to auscultation bilaterally without wheezes or crackles Cardiovascular: Regular rate and rhythm without murmur gallop or rub normal S1 and S2 Abdomen: Nontender, nondistended, soft, bowel sounds positive, no rebound, no ascites, no appreciable mass Extremities: No significant cyanosis, clubbing, or edema bilateral lower extremities  Data Reviewed: Basic Metabolic Panel:  Recent Labs Lab 06/24/13 0640 06/25/13 1800 06/25/13 1816 06/26/13 0305  NA 140 137 138 136*  K 5.0 4.2  4.1 4.4  CL  --  98 102 98  CO2  --  23  --  23  GLUCOSE 219* 84 77 93  BUN  --  40* 38* 41*  CREATININE  --  6.83* 6.90* 7.30*  CALCIUM  --  7.7*  --  7.9*   Liver Function Tests:  Recent Labs Lab 06/25/13 1800 06/26/13 0305  AST 34 28  ALT 41* 34  ALKPHOS 75 66  BILITOT 0.4 0.4  PROT 7.4 6.2  ALBUMIN 3.0* 2.6*   No results found for this basename: LIPASE, AMYLASE,  in the last 168 hours No results found for  this basename: AMMONIA,  in the last 168 hours  CBC:  Recent Labs Lab 06/22/13 1320 06/24/13 0640 06/25/13 1800 06/25/13 1816  WBC  --   --  11.2*  --   NEUTROABS  --   --  7.7  --   HGB 7.7* 9.2* 8.9* 9.9*  HCT 23.8* 27.0* 26.1* 29.0*  MCV  --   --  85.3  --   PLT  --   --  182  --    Cardiac Enzymes:  Recent Labs Lab 06/25/13 1800 06/26/13 0545  TROPONINI 1.50* 0.95*   CBG:  Recent Labs Lab 06/25/13 1743 06/25/13 1851 06/25/13 2145 06/26/13 0814 06/26/13 1146  GLUCAP 55* 167* 143* 117* 120*    Recent Results (from the past 240 hour(s))  MRSA PCR SCREENING     Status: None   Collection Time    06/26/13  6:27 AM      Result Value Range Status   MRSA by PCR NEGATIVE  NEGATIVE Final   Comment:            The GeneXpert MRSA Assay (FDA     approved for NASAL specimens     only), is one component of a     comprehensive MRSA colonization     surveillance program. It is not     intended to diagnose MRSA     infection nor to guide or     monitor treatment for     MRSA infections.     Studies:  Recent x-ray studies have been reviewed in detail by the Attending Physician  Scheduled Meds:  Scheduled Meds: . aspirin EC  81 mg Oral Daily  . cholecalciferol  1,000 Units Oral Daily  . furosemide  40 mg Oral BID  . heparin  5,000 Units Subcutaneous Q8H  . insulin aspart  2 Units Subcutaneous TID WC  . labetalol  200 mg Oral BID  . minoxidil  10 mg Oral Daily    Time spent on care of this patient: 35 mins   [place]  [phone] Pager - Text Page per Shea Evans as per below:  On-Call/Text Page:      [email or web address]      password [email or web address]  If 7PM-7AM, please contact night-coverage [email or web address] Password [email or web address] 06/26/2013, 3:34 PM   LOS: 1 day

## 2013-06-26 NOTE — Progress Notes (Signed)
  Echocardiogram 2D Echocardiogram has been performed.  Lela Gell FRANCES 06/26/2013, 3:22 PM

## 2013-06-27 ENCOUNTER — Encounter (HOSPITAL_COMMUNITY): Payer: Self-pay | Admitting: Surgery

## 2013-06-27 LAB — CBC
HEMATOCRIT: 22.8 % — AB (ref 36.0–46.0)
HEMOGLOBIN: 7.8 g/dL — AB (ref 12.0–15.0)
MCH: 29.1 pg (ref 26.0–34.0)
MCHC: 34.2 g/dL (ref 30.0–36.0)
MCV: 85.1 fL (ref 78.0–100.0)
Platelets: 168 10*3/uL (ref 150–400)
RBC: 2.68 MIL/uL — AB (ref 3.87–5.11)
RDW: 13.9 % (ref 11.5–15.5)
WBC: 7.1 10*3/uL (ref 4.0–10.5)

## 2013-06-27 LAB — COMPREHENSIVE METABOLIC PANEL
ALK PHOS: 71 U/L (ref 39–117)
ALT: 29 U/L (ref 0–35)
AST: 20 U/L (ref 0–37)
Albumin: 2.7 g/dL — ABNORMAL LOW (ref 3.5–5.2)
BUN: 46 mg/dL — ABNORMAL HIGH (ref 6–23)
CHLORIDE: 94 meq/L — AB (ref 96–112)
CO2: 22 mEq/L (ref 19–32)
Calcium: 7.5 mg/dL — ABNORMAL LOW (ref 8.4–10.5)
Creatinine, Ser: 8.25 mg/dL — ABNORMAL HIGH (ref 0.50–1.10)
GFR calc non Af Amer: 4 mL/min — ABNORMAL LOW (ref >90)
GFR, EST AFRICAN AMERICAN: 5 mL/min — AB (ref >90)
GLUCOSE: 193 mg/dL — AB (ref 70–99)
POTASSIUM: 4.8 meq/L (ref 3.7–5.3)
SODIUM: 132 meq/L — AB (ref 137–147)
Total Bilirubin: 0.4 mg/dL (ref 0.3–1.2)
Total Protein: 6.7 g/dL (ref 6.0–8.3)

## 2013-06-27 LAB — GLUCOSE, CAPILLARY
GLUCOSE-CAPILLARY: 227 mg/dL — AB (ref 70–99)
Glucose-Capillary: 176 mg/dL — ABNORMAL HIGH (ref 70–99)
Glucose-Capillary: 219 mg/dL — ABNORMAL HIGH (ref 70–99)

## 2013-06-27 LAB — IRON AND TIBC
Iron: 27 ug/dL — ABNORMAL LOW (ref 42–135)
Saturation Ratios: 12 % — ABNORMAL LOW (ref 20–55)
TIBC: 230 ug/dL — ABNORMAL LOW (ref 250–470)
UIBC: 203 ug/dL (ref 125–400)

## 2013-06-27 LAB — TROPONIN I: Troponin I: 0.73 ng/mL (ref <0.30)

## 2013-06-27 MED ORDER — DARBEPOETIN ALFA-POLYSORBATE 60 MCG/0.3ML IJ SOLN
60.0000 ug | Freq: Once | INTRAMUSCULAR | Status: AC
  Administered 2013-06-27: 60 ug via INTRAVENOUS
  Filled 2013-06-27: qty 0.3

## 2013-06-27 MED ORDER — INSULIN ASPART 100 UNIT/ML ~~LOC~~ SOLN
0.0000 [IU] | Freq: Three times a day (TID) | SUBCUTANEOUS | Status: DC
  Administered 2013-06-27: 3 [IU] via SUBCUTANEOUS

## 2013-06-27 MED ORDER — DARBEPOETIN ALFA-POLYSORBATE 60 MCG/0.3ML IJ SOLN
INTRAMUSCULAR | Status: AC
  Filled 2013-06-27: qty 0.3

## 2013-06-27 MED ORDER — INSULIN ASPART 100 UNIT/ML ~~LOC~~ SOLN: 0.0000 [IU] | Freq: Every day | SUBCUTANEOUS | Status: DC

## 2013-06-27 MED ORDER — CALCIUM CARBONATE ANTACID 500 MG PO CHEW
1.0000 | CHEWABLE_TABLET | Freq: Three times a day (TID) | ORAL | Status: DC
  Administered 2013-06-27: 200 mg via ORAL
  Filled 2013-06-27 (×5): qty 1

## 2013-06-27 MED ORDER — INSULIN NPH (HUMAN) (ISOPHANE) 100 UNIT/ML ~~LOC~~ SUSP
10.0000 [IU] | Freq: Two times a day (BID) | SUBCUTANEOUS | Status: DC
  Administered 2013-06-28: 11:00:00 10 [IU] via SUBCUTANEOUS
  Filled 2013-06-27 (×2): qty 10

## 2013-06-27 NOTE — Consult Note (Signed)
Referring Provider: No ref. provider found Primary Care Physician:  [name], MD Primary Nephrologist:  Dr. Tracey Harries  Reason for Consultation:  Chronic Renal Failure stage 30, medical management HPI:73  y.o. Female with past medical history significant for diabetes , hypertension and end-stage renal disease who was recently started on dialysis who presented with the acute onset of convulsions after she finished her first dialysis.She displays intermittent jerking movements of her neck and arms.    Past Medical History  Diagnosis Date  . Hypertension   . Cataract   . Diabetes mellitus   . Chronic kidney disease   . Shortness of breath   . Anemia     Past Surgical History  Procedure Laterality Date  . No past surgeries    . Cataract extraction w/phaco  11/20/2011    Procedure: CATARACT EXTRACTION PHACO AND INTRAOCULAR LENS PLACEMENT (IOC);  Surgeon: Tonny Branch, MD;  Location: [facility];  Service: Ophthalmology;  Laterality: Right;  CDE 18.82  . Cataract extraction w/phaco Left 11/18/2012    Procedure: CATARACT EXTRACTION PHACO AND INTRAOCULAR LENS PLACEMENT (IOC);  Surgeon: Tonny Branch, MD;  Location: [facility];  Service: Ophthalmology;  Laterality: Left;  CDE: 18.97  . Colonoscopy N/A 02/09/2013    Procedure: COLONOSCOPY;  Surgeon: Rogene Houston, MD;  Location: [facility];  Service: Endoscopy;  Laterality: N/A;  305-moved to 57 Ann to notify pt    Prior to Admission medications   Medication Sig Start Date End Date Taking? Authorizing Provider  cholecalciferol (VITAMIN D) 1000 UNITS tablet Take 1,000 Units by mouth daily.   Yes Historical Provider, MD  Cyanocobalamin (VITAMIN B-12 IJ) Inject 1 application as directed every 30 (thirty) days.   Yes Historical Provider, MD  furosemide (LASIX) 40 MG tablet Take 40 mg by mouth 2 (two) times daily.   Yes Historical Provider, MD  ibuprofen (ADVIL,MOTRIN) 200 MG tablet Take 400 mg by mouth every 6 (six) hours as needed for pain.   Yes  Historical Provider, MD  insulin aspart protamine-insulin aspart (NOVOLOG 70/30) (70-30) 100 UNIT/ML injection Inject 44-60 Units into the skin 2 (two) times daily with a meal. Patient takes 60 units in the morning and 44 units in the evening   Yes Historical Provider, MD  labetalol (NORMODYNE) 200 MG tablet Take 200 mg by mouth 2 (two) times daily.   Yes Historical Provider, MD  minoxidil (LONITEN) 10 MG tablet Take 10 mg by mouth daily.   Yes Historical Provider, MD    Current Facility-Administered Medications  Medication Dose Route Frequency Provider Last Rate Last Dose  . acetaminophen (TYLENOL) tablet 650 mg  650 mg Oral Q6H PRN Merton Border, MD       Or  . acetaminophen (TYLENOL) suppository 650 mg  650 mg Rectal Q6H PRN Merton Border, MD      . aspirin EC tablet 81 mg  81 mg Oral Daily Merton Border, MD   81 mg at 06/27/13 1055  . calcitRIOL (ROCALTROL) capsule 0.5 mcg  0.5 mcg Oral Daily Cherene Altes, MD   0.5 mcg at 06/26/13 1939  . calcium carbonate (TUMS - dosed in mg elemental calcium) chewable tablet 400 mg of elemental calcium  400 mg of elemental calcium Oral TID WC Cherene Altes, MD      . heparin injection 5,000 Units  5,000 Units Subcutaneous Q8H Merton Border, MD   5,000 Units at 06/27/13 0529  . insulin aspart (novoLOG) injection 2 Units  2 Units Subcutaneous TID WC  Merton Border, MD      . labetalol (NORMODYNE) tablet 200 mg  200 mg Oral BID Cherene Altes, MD   200 mg at 06/27/13 1055  . minoxidil (LONITEN) tablet 10 mg  10 mg Oral Daily Cherene Altes, MD   10 mg at 06/27/13 1054  . morphine 2 MG/ML injection 1 mg  1 mg Intravenous Q4H PRN Merton Border, MD   1 mg at 06/26/13 1455  . ondansetron (ZOFRAN) tablet 4 mg  4 mg Oral Q6H PRN Merton Border, MD       Or  . ondansetron (ZOFRAN) injection 4 mg  4 mg Intravenous Q6H PRN Merton Border, MD   4 mg at 06/27/13 1105  . oxyCODONE (Oxy IR/ROXICODONE) immediate release tablet 5 mg  5 mg Oral Q4H PRN Merton Border, MD   5 mg at 06/27/13  0529  . zolpidem (AMBIEN) tablet 5 mg  5 mg Oral QHS PRN Merton Border, MD        Allergies as of 06/25/2013  . (No Known Allergies)    Family History  Problem Relation Age of Onset  . Anesthesia problems Neg Hx   . Hypotension Neg Hx   . Malignant hyperthermia Neg Hx   . Pseudochol deficiency Neg Hx   . Cancer Sister   . Cancer Brother     History   Social History  . Marital Status: Widowed    Spouse Name: N/A    Number of Children: N/A  . Years of Education: N/A   Occupational History  . Not on file.   Social History Main Topics  . Smoking status: Never Smoker   . Smokeless tobacco: Never Used  . Alcohol Use: No  . Drug Use: No  . Sexual Activity: Not on file   Other Topics Concern  . Not on file   Social History Narrative  . No narrative on file    Review of Systems: Gen: Weakness and fatigue HEENT: No visual complaints, No history of Retinopathy. Normal external appearance No Epistaxis or Sore throat. No sinusitis.   CV: Denies chest pain, angina, palpitations, syncope, orthopnea, PND, peripheral edema, and claudication. Resp: Denies dyspnea at rest, dyspnea with exercise, cough, sputum, wheezing, coughing up blood, and pleurisy. GI: Denies vomiting blood, jaundice, and fecal incontinence.   Denies dysphagia or odynophagia. GU : Denies urinary burning, blood in urine, urinary frequency, urinary hesitancy, nocturnal urination, and urinary incontinence.  No renal calculi. MS: Denies joint pain, limitation of movement, and swelling, stiffness, low back pain, extremity pain. Denies muscle weakness, cramps, atrophy.  No use of non steroidal antiinflammatory drugs. Derm: Denies rash, itching, dry skin, hives, moles, warts, or unhealing ulcers.  Psych: Denies depression, anxiety, memory loss, suicidal ideation, hallucinations, paranoia, and confusion. Heme: Denies bruising, bleeding, and enlarged lymph nodes. Neuro: No headache.  No diplopia. No dysarthria.  No  dysphasia.  No history of CVA.  No Seizures. No paresthesias.  No weakness. Endocrine DM.  No Thyroid disease.  No Adrenal disease.  Physical Exam: Vital signs in last 24 hours: Temp:  [98 F (36.7 C)-98.3 F (36.8 C)] 98.3 F (36.8 C) (01/12 1146) Pulse Rate:  [80-93] 80 (01/12 1055) Resp:  [15-17] 15 (01/12 0320) BP: (101-137)/(43-59) 124/57 mmHg (01/12 1054) SpO2:  [98 %-99 %] 99 % (01/12 0320) Weight:  [96.4 kg (212 lb 8.4 oz)] 96.4 kg (212 lb 8.4 oz) (01/12 0320) Last BM Date: 06/25/13 General:   Alert,  Well-developed, well-nourished, pleasant and cooperative  in NAD Head:  Normocephalic and atraumatic. Eyes:  Sclera clear, no icterus.   Conjunctiva pink. Ears:  Normal auditory acuity. Nose:  No deformity, discharge,  or lesions. Mouth:  No deformity or lesions, dentition normal. Neck:  Supple; no masses or thyromegaly. JVP not elevated I.J Catheter Lungs:  Clear throughout to auscultation.   No wheezes, crackles, or rhonchi. No acute distress. Heart:  Regular rate and rhythm; no murmurs, clicks, rubs,  or gallops. Abdomen:  Soft, nontender and nondistended. No masses, hepatosplenomegaly or hernias noted. Normal bowel sounds, without guarding, and without rebound.   Msk:  Symmetrical without gross deformities. Normal posture. Pulses:  No carotid, renal, femoral bruits. DP and PT symmetrical and equal Extremities:  Without clubbing or edema. Neurologic:  Alert and  oriented x4;  grossly normal neurologically. Skin:  Intact without significant lesions or rashes. Cervical Nodes:  No significant cervical adenopathy. Psych:  Alert and cooperative. Normal mood and affect.  Intake/Output from previous day: 01/11 0701 - 01/12 0700 In: 1160 [P.O.:360; I.V.:550; IV Piggyback:250] Out: 0  Intake/Output this shift:    Lab Results:  Recent Labs  06/25/13 1800 06/25/13 1816 06/27/13 0305  WBC 11.2*  --  7.1  HGB 8.9* 9.9* 7.8*  HCT 26.1* 29.0* 22.8*  PLT 182  --  168    BMET  Recent Labs  06/25/13 1800 06/25/13 1816 06/26/13 0305 06/26/13 1655 06/27/13 0305  NA 137 138 136*  --  132*  K 4.2 4.1 4.4  --  4.8  CL 98 102 98  --  94*  CO2 23  --  23  --  22  GLUCOSE 84 77 93  --  193*  BUN 40* 38* 41*  --  46*  CREATININE 6.83* 6.90* 7.30*  --  8.25*  CALCIUM 7.7*  --  7.9*  --  7.5*  PHOS  --   --   --  5.1*  --    LFT  Recent Labs  06/27/13 0305  PROT 6.7  ALBUMIN 2.7*  AST 20  ALT 29  ALKPHOS 71  BILITOT 0.4   PT/INR No results found for this basename: LABPROT, INR,  in the last 72 hours Hepatitis Panel No results found for this basename: HEPBSAG, HCVAB, HEPAIGM, HEPBIGM,  in the last 72 hours  Studies/Results: Ct Head Wo Contrast  06/25/2013   CLINICAL DATA:  73 year old female with generalized weakness and persistent shaking.  EXAM: CT HEAD WITHOUT CONTRAST  TECHNIQUE: Contiguous axial images were obtained from the base of the skull through the vertex without intravenous contrast.  COMPARISON:  08/16/2009 head CT  FINDINGS: No acute intracranial abnormalities are identified, including mass lesion or mass effect, hydrocephalus, extra-axial fluid collection, midline shift, hemorrhage, or acute infarction.  Bilateral basal ganglia calcifications are again noted.  The visualized bony calvarium is unremarkable.  IMPRESSION: No evidence of acute intracranial abnormality.   Electronically Signed   By: Hassan Rowan M.D.   On: 06/25/2013 19:18    Assessment/Plan:  ESRD-new start to dialysis  ANEMIA- check iron studies and start aranesp  MBD- phos controlled   HTN/VOL-controlled  ACCESS- out patient appointment Dr Trula Slade   LOS: 2 Edrick Oh W @TODAY @1 :21 PM

## 2013-06-27 NOTE — Progress Notes (Signed)
Patient c/o being uncomfortable lying in bed. Pt. Very quiet and has a look of discomfort, when pt. Questioned she answered yes. Got patient into the chair patient became nauseated and vomited up food as well as one of her BP medicine she had just taken. Pt. Received Zofran 4mg  IV.

## 2013-06-27 NOTE — Progress Notes (Signed)
Utilization review completed.  

## 2013-06-27 NOTE — Progress Notes (Signed)
Progress Note [facility] TEAM 1 - Stepdown/ICU TEAM   LAKEITHIA JOECKEL [id number] DOB: 06/26/1940 DOA: 06/25/2013 PCP: [name], MD  Admit HPI / Brief Narrative: 73 y.o. Female with past medical history significant for diabetes , hypertension and end-stage renal disease who was recently started on dialysis who presented with the acute onset of convulsions after she finished her first dialysis. No previous history of seizures or convulsions. In the emergency room she received D50 water and juice for hypoglycemia with a blood sugar of 43, and was started on TUMS for hypocalcemia with a calcium of 1.02.  Her troponin was found to be elevated at 1.5 and and EKG showed ST depressions in inferolateral leads.  HPI/Subjective: The nurse and family report some intermittent mild delirium this morning.  The patient suffered one episode of nausea with vomiting after eating this morning.  At the time of my visit she is sitting up in a chair.  She looks much better than yesterday.  She is alert and conversant.  She has no new complaints.  She reports that her abrupt jerking movements have stopped.  Assessment/Plan:  Hypoglycemia in DM Resolved - CBG 43 at time of presentation to ER - cont to follow trend w/ SSI coverage and a portion of her usual home tx   Moderate Hypocalcemia Ionized Ca low at 1.03, but not severely low - corrected Ca was actually ~9 given low albumin of 2.6 - continue with oral replacement - follow levels - Mg and Phos not severely deranged - doubt her Ca++ level had anything to do with her presentation   Jerking movements Unclear etiology - I do not believe her Ca++ was low enough to be the causing these movements - pt reports they started immediately after her first HD tx yesterday - Mg and Phos not severely deranged - BUN does not appear high enough to blame this on uremia - likely simple "metabolic jerks" related to fluid shifting in new start HD pt - follow clinically -  have resoled at this time   Elevated troponin Peaked at 1.50 - now trending downward - no evidence of STEMI on EKG - f/u EKG this am is w/o acute findings - follow troponin to normalization - TTE w/o focal WMA   Newly Diagnosed Grade 2 Diastolic CHF Noted on TTE - volume control will be via HD - BP control will be important   ESRD Started HD on 1/10 ([facility] in [place]) - reports is supposed to be on a M/W/F routine - Nephrology is arranging HD for toady   Anemia of chronic kidney disease No evidence of acute blood loss - suspect decrease in Hgb due to dilution - follow Hgb trend - Fe/epo per Nephrology   HTN Currently reasonably controlled  Code Status: FULL Family Communication: Spoke with patient, daughter, and grandson at bedside Disposition Plan: SDU  Consultants: Nephrology for 1/12  Procedures: 1/9 - Diatek catheter right internal jugular vein at [facility] 1/11 - TTE - mild LVH - EF 65-70% - no wall motion abnormalities - grade 2 diastolic dysfunction - moderate tricuspid regurg  Antibiotics: none  DVT prophylaxis: SCDs  Objective: Blood pressure 124/57, pulse 80, temperature 98.3 F (36.8 C), temperature source Oral, resp. rate 15, height 6' (1.829 m), weight 96.4 kg (212 lb 8.4 oz), SpO2 99.00%.  Intake/Output Summary (Last 24 hours) at 06/27/13 1355 Last data filed at 06/27/13 0320  Gross per 24 hour  Intake    920 ml  Output  0 ml  Net    920 ml   Exam: General: No acute respiratory distress - alert and oriented at present  Lungs: Clear to auscultation bilaterally without wheezes or crackles Cardiovascular: Regular rate and rhythm without murmur gallop or rub  Abdomen: Nontender, nondistended, soft, bowel sounds positive, no rebound, no ascites, no appreciable mass Extremities: No significant cyanosis, clubbing, or edema bilateral lower extremities  Data Reviewed: Basic Metabolic Panel:  Recent Labs Lab 06/24/13 0640 06/25/13 1800 06/25/13 1816  06/26/13 0305 06/26/13 1655 06/27/13 0305  NA 140 137 138 136*  --  132*  K 5.0 4.2 4.1 4.4  --  4.8  CL  --  98 102 98  --  94*  CO2  --  23  --  23  --  22  GLUCOSE 219* 84 77 93  --  193*  BUN  --  40* 38* 41*  --  46*  CREATININE  --  6.83* 6.90* 7.30*  --  8.25*  CALCIUM  --  7.7*  --  7.9*  --  7.5*  MG  --   --   --   --  1.9  --   PHOS  --   --   --   --  5.1*  --    Liver Function Tests:  Recent Labs Lab 06/25/13 1800 06/26/13 0305 06/27/13 0305  AST 34 28 20  ALT 41* 34 29  ALKPHOS 75 66 71  BILITOT 0.4 0.4 0.4  PROT 7.4 6.2 6.7  ALBUMIN 3.0* 2.6* 2.7*    Recent Labs Lab 06/26/13 1655  LIPASE 25   CBC:  Recent Labs Lab 06/22/13 1320 06/24/13 0640 06/25/13 1800 06/25/13 1816 06/27/13 0305  WBC  --   --  11.2*  --  7.1  NEUTROABS  --   --  7.7  --   --   HGB 7.7* 9.2* 8.9* 9.9* 7.8*  HCT 23.8* 27.0* 26.1* 29.0* 22.8*  MCV  --   --  85.3  --  85.1  PLT  --   --  182  --  168   Cardiac Enzymes:  Recent Labs Lab 06/25/13 1800 06/26/13 0545 06/26/13 1655 06/26/13 1850 06/27/13 0305  CKTOTAL  --   --  227*  --   --   TROPONINI 1.50* 0.95*  --  0.89* 0.73*   CBG:  Recent Labs Lab 06/26/13 1146 06/26/13 1710 06/26/13 2218 06/27/13 0754 06/27/13 1124  GLUCAP 120* 156* 211* 176* 219*    Recent Results (from the past 240 hour(s))  MRSA PCR SCREENING     Status: None   Collection Time    06/26/13  6:27 AM      Result Value Range Status   MRSA by PCR NEGATIVE  NEGATIVE Final   Comment:            The GeneXpert MRSA Assay (FDA     approved for NASAL specimens     only), is one component of a     comprehensive MRSA colonization     surveillance program. It is not     intended to diagnose MRSA     infection nor to guide or     monitor treatment for     MRSA infections.     Studies:  Recent x-ray studies have been reviewed in detail by the Attending Physician  Scheduled Meds:  Scheduled Meds: . aspirin EC  81 mg Oral Daily    . calcitRIOL  0.5 mcg  Oral Daily  . calcium carbonate  400 mg of elemental calcium Oral TID WC  . darbepoetin (ARANESP) injection - DIALYSIS  60 mcg Intravenous Once  . heparin  5,000 Units Subcutaneous Q8H  . insulin aspart  2 Units Subcutaneous TID WC  . labetalol  200 mg Oral BID  . minoxidil  10 mg Oral Daily    Time spent on care of this patient: 35 mins   [place]  [phone] Pager - Text Page per Shea Evans as per below:  On-Call/Text Page:      [email or web address]      password [email or web address]  If 7PM-7AM, please contact night-coverage [email or web address] Password [email or web address] 06/27/2013, 1:55 PM   LOS: 2 days

## 2013-06-28 DIAGNOSIS — E1149 Type 2 diabetes mellitus with other diabetic neurological complication: Secondary | ICD-10-CM

## 2013-06-28 DIAGNOSIS — R7989 Other specified abnormal findings of blood chemistry: Secondary | ICD-10-CM

## 2013-06-28 DIAGNOSIS — E162 Hypoglycemia, unspecified: Secondary | ICD-10-CM

## 2013-06-28 LAB — CBC
HCT: 23.7 % — ABNORMAL LOW (ref 36.0–46.0)
HEMOGLOBIN: 8 g/dL — AB (ref 12.0–15.0)
MCH: 29.1 pg (ref 26.0–34.0)
MCHC: 33.8 g/dL (ref 30.0–36.0)
MCV: 86.2 fL (ref 78.0–100.0)
Platelets: 162 10*3/uL (ref 150–400)
RBC: 2.75 MIL/uL — AB (ref 3.87–5.11)
RDW: 13.7 % (ref 11.5–15.5)
WBC: 10.9 10*3/uL — AB (ref 4.0–10.5)

## 2013-06-28 LAB — GLUCOSE, CAPILLARY
Glucose-Capillary: 143 mg/dL — ABNORMAL HIGH (ref 70–99)
Glucose-Capillary: 233 mg/dL — ABNORMAL HIGH (ref 70–99)
Glucose-Capillary: 210 mg/dL — ABNORMAL HIGH (ref 70–99)
Glucose-Capillary: 126 mg/dL — ABNORMAL HIGH (ref 70–99)
Glucose-Capillary: 120 mg/dL — ABNORMAL HIGH (ref 70–99)

## 2013-06-28 LAB — HEPATITIS B CORE ANTIBODY, TOTAL: HEP B C TOTAL AB: NONREACTIVE

## 2013-06-28 LAB — PARATHYROID HORMONE, INTACT (NO CA): PTH: 336.7 pg/mL — AB (ref 14.0–72.0)

## 2013-06-28 LAB — HEPATITIS B SURFACE ANTIGEN: HEP B S AG: NEGATIVE

## 2013-06-28 LAB — HEPATITIS B SURFACE ANTIBODY,QUALITATIVE: HEP B S AB: NEGATIVE

## 2013-06-28 MED ORDER — CALCIUM CARBONATE ANTACID 500 MG PO CHEW
1.0000 | CHEWABLE_TABLET | Freq: Three times a day (TID) | ORAL | Status: DC
  Administered 2013-06-28 – 2013-07-04 (×15): 200 mg via ORAL
  Filled 2013-06-28 (×22): qty 1

## 2013-06-28 MED ORDER — SODIUM CHLORIDE 0.9 % IV SOLN
125.0000 mg | INTRAVENOUS | Status: DC
  Administered 2013-06-29 – 2013-07-04 (×3): 125 mg via INTRAVENOUS
  Filled 2013-06-28 (×6): qty 10

## 2013-06-28 MED ORDER — INSULIN NPH (HUMAN) (ISOPHANE) 100 UNIT/ML ~~LOC~~ SUSP
15.0000 [IU] | Freq: Two times a day (BID) | SUBCUTANEOUS | Status: DC
  Administered 2013-06-28 – 2013-06-29 (×2): 15 [IU] via SUBCUTANEOUS
  Filled 2013-06-28 (×2): qty 10

## 2013-06-28 MED ORDER — INSULIN ASPART 100 UNIT/ML ~~LOC~~ SOLN
0.0000 [IU] | Freq: Three times a day (TID) | SUBCUTANEOUS | Status: DC
  Administered 2013-06-28 (×2): 3 [IU] via SUBCUTANEOUS
  Administered 2013-06-28 – 2013-06-29 (×2): 1 [IU] via SUBCUTANEOUS
  Administered 2013-07-01: 18:00:00 via SUBCUTANEOUS
  Administered 2013-07-02: 1 [IU] via SUBCUTANEOUS
  Administered 2013-07-03: 2 [IU] via SUBCUTANEOUS
  Administered 2013-07-03 (×2): 1 [IU] via SUBCUTANEOUS

## 2013-06-28 NOTE — Progress Notes (Signed)
Pt arrived from HD. Blood sugar was 143. Notified MD to see if Humulin N should be held. MD ordered bedtime dose to be held. Recheck in the morning for breakfast dose. Will continue to monitor.

## 2013-06-28 NOTE — Progress Notes (Signed)
[facility] ROUNDING NOTE   Subjective:   Interval History: stable    Objective:  Vital signs in last 24 hours:  Temp:  [98.1 F (36.7 C)-99 F (37.2 C)] 99 F (37.2 C) (01/13 0505) Pulse Rate:  [83-99] 83 (01/13 1014) Resp:  [12-20] 16 (01/13 0505) BP: (118-142)/(53-78) 118/66 mmHg (01/13 1014) SpO2:  [97 %-100 %] 99 % (01/13 0505) Weight:  [93.5 kg (206 lb 2.1 oz)-94.3 kg (207 lb 14.3 oz)] 93.5 kg (206 lb 2.1 oz) (01/12 2317)  Weight change: -2.1 kg (-4 lb 10.1 oz) Filed Weights   06/27/13 0320 06/27/13 2045 06/27/13 2317  Weight: 96.4 kg (212 lb 8.4 oz) 94.3 kg (207 lb 14.3 oz) 93.5 kg (206 lb 2.1 oz)    Intake/Output: I/O last 3 completed shifts: In: 370 [P.O.:120; IV Piggyback:250] Out: 703 [Other:703]   Intake/Output this shift:     CVS- RRR RS- CTA ABD- BS present soft non-distended EXT- no edema   Basic Metabolic Panel:  Recent Labs Lab 06/24/13 0640 06/25/13 1800 06/25/13 1816 06/26/13 0305 06/26/13 1655 06/27/13 0305  NA 140 137 138 136*  --  132*  K 5.0 4.2 4.1 4.4  --  4.8  CL  --  98 102 98  --  94*  CO2  --  23  --  23  --  22  GLUCOSE 219* 84 77 93  --  193*  BUN  --  40* 38* 41*  --  46*  CREATININE  --  6.83* 6.90* 7.30*  --  8.25*  CALCIUM  --  7.7*  --  7.9*  --  7.5*  MG  --   --   --   --  1.9  --   PHOS  --   --   --   --  5.1*  --     Liver Function Tests:  Recent Labs Lab 06/25/13 1800 06/26/13 0305 06/27/13 0305  AST 34 28 20  ALT 41* 34 29  ALKPHOS 75 66 71  BILITOT 0.4 0.4 0.4  PROT 7.4 6.2 6.7  ALBUMIN 3.0* 2.6* 2.7*    Recent Labs Lab 06/26/13 1655  LIPASE 25   No results found for this basename: AMMONIA,  in the last 168 hours  CBC:  Recent Labs Lab 06/24/13 0640 06/25/13 1800 06/25/13 1816 06/27/13 0305 06/28/13 0605  WBC  --  11.2*  --  7.1 10.9*  NEUTROABS  --  7.7  --   --   --   HGB 9.2* 8.9* 9.9* 7.8* 8.0*  HCT 27.0* 26.1* 29.0* 22.8* 23.7*  MCV  --  85.3  --  85.1 86.2   PLT  --  182  --  168 162    Cardiac Enzymes:  Recent Labs Lab 06/25/13 1800 06/26/13 0545 06/26/13 1655 06/26/13 1850 06/27/13 0305  CKTOTAL  --   --  227*  --   --   TROPONINI 1.50* 0.95*  --  0.89* 0.73*    BNP: No components found with this basename: POCBNP,   CBG:  Recent Labs Lab 06/27/13 0754 06/27/13 1124 06/27/13 1620 06/28/13 0032 06/28/13 0947  GLUCAP 176* 219* 227* 143* 233*    Microbiology: Results for orders placed during the hospital encounter of 06/25/13  MRSA PCR SCREENING     Status: None   Collection Time    06/26/13  6:27 AM      Result Value Range Status   MRSA by PCR NEGATIVE  NEGATIVE Final  Comment:            The GeneXpert MRSA Assay (FDA     approved for NASAL specimens     only), is one component of a     comprehensive MRSA colonization     surveillance program. It is not     intended to diagnose MRSA     infection nor to guide or     monitor treatment for     MRSA infections.    Coagulation Studies: No results found for this basename: LABPROT, INR,  in the last 72 hours  Urinalysis:  Recent Labs  06/25/13 1833  LABSPEC 1.024  PHURINE 5.0  GLUCOSEU NEGATIVE  HGBUR SMALL*  BILIRUBINUR NEGATIVE  KETONESUR 15*  PROTEINUR >300*  UROBILINOGEN 0.2  NITRITE NEGATIVE  LEUKOCYTESUR NEGATIVE      Imaging: No results found.   Medications:     . aspirin EC  81 mg Oral Daily  . calcitRIOL  0.5 mcg Oral Daily  . calcium carbonate  1 tablet Oral TID WC  . darbepoetin      . heparin  5,000 Units Subcutaneous Q8H  . insulin aspart  0-9 Units Subcutaneous TID WC  . insulin NPH Human  10 Units Subcutaneous BID AC & HS  . labetalol  200 mg Oral BID  . minoxidil  10 mg Oral Daily   acetaminophen, acetaminophen, morphine injection, ondansetron (ZOFRAN) IV, ondansetron, oxyCODONE, zolpidem  Assessment/ Plan:  1.ESRD-new start to dialysis  2ANEMIA-  iron studies show iron deficiency , start iron    Getting aranesp 3  MBD- phos controlled  HTN/VOL-controlled  ACCESS- out patient appointment Dr Trula Slade      LOS: 3 Amara Manalang W @TODAY @11 :03 AM

## 2013-06-28 NOTE — Progress Notes (Addendum)
Progress Note [place] TEAM 1 - Stepdown/ICU TEAM   JYNELLE DEPAOLA [id number] DOB: 06/19/1940 DOA: 06/25/2013 PCP: [name], MD  Admit HPI / Brief Narrative: 73 y.o. Female with past medical history significant for diabetes , hypertension and end-stage renal disease who was recently started on dialysis who presented with the acute onset of convulsions after she finished her first dialysis. No previous history of seizures or convulsions. In the emergency room she received D50 water and juice for hypoglycemia with a blood sugar of 43, and was started on TUMS for hypocalcemia with a calcium of 1.02.  Her troponin was found to be elevated at 1.5 and and EKG showed ST depressions in inferolateral leads.  HPI/Subjective: Vomiting after dinner last night and again this AM-  Assessment/Plan:  Hypoglycemia in DM - likely due to vomiting prior to admission - Resolved - CBG 43 at time of presentation to ER - cont to follow trend - will increase NPH slightly today for better control of sugars - still not able to tolerate POs  Vomiting - does not appear to have obstruction or ileus per exam therefore hold off on Xray for now - may be acute viral illness but due to longstanding diabetes, would like to rule out delayed gastric emptying  Moderate Hypocalcemia Ionized Ca low at 1.03, but not severely low - corrected Ca was actually ~9 given low albumin of 2.6 - continue with oral replacement - follow levels - Mg and Phos not severely deranged - doubt her Ca++ level had anything to do with her presentation   Jerking movements Unclear etiology - doubt her Ca++ was low enough to be the causing these movements - pt reports they started immediately after her first HD tx - Mg and Phos not severely deranged - BUN does not appear high enough to blame this on uremia - likely simple "metabolic jerks" related to fluid shifting in new start HD pt - follow clinically - have resoled at this time    Elevated troponin Peaked at 1.50 - now trending downward - no evidence of STEMI on EKG - f/u EKG this am is w/o acute findings - follow troponin to normalization - TTE w/o focal WMA   Newly Diagnosed Grade 2 Diastolic CHF Noted on TTE - volume control will be via HD - BP control will be important   ESRD Started HD on 1/10 ([facility] in [place]) - reports is supposed to be on a M/W/F routine -   Anemia of chronic kidney disease No evidence of acute blood loss - suspect decrease in Hgb due to dilution - follow Hgb trend - Fe/epo per Nephrology   HTN Currently reasonably controlled  Code Status: FULL Family Communication: Spoke with patient and daughter at bedside Disposition Plan: SDU  Consultants: Nephrology for 1/12  Procedures: 1/9 - Diatek catheter right internal jugular vein at AP 1/11 - TTE - mild LVH - EF 65-70% - no wall motion abnormalities - grade 2 diastolic dysfunction - moderate tricuspid regurg  Antibiotics: none  DVT prophylaxis: SCDs  Objective: Blood pressure 108/60, pulse 85, temperature 98.5 F (36.9 C), temperature source Oral, resp. rate 20, height 6' (1.829 m), weight 93.5 kg (206 lb 2.1 oz), SpO2 95.00%.  Intake/Output Summary (Last 24 hours) at 06/28/13 1630 Last data filed at 06/28/13 1500  Gross per 24 hour  Intake      0 ml  Output   1028 ml  Net  -1028 ml   Exam: General: No acute respiratory  distress - alert and oriented at present  Lungs: Clear to auscultation bilaterally without wheezes or crackles Cardiovascular: Regular rate and rhythm without murmur gallop or rub  Abdomen: Nontender, nondistended, soft, bowel sounds positive, no rebound, no ascites, no appreciable mass Extremities: No significant cyanosis, clubbing, or edema bilateral lower extremities  Data Reviewed: Basic Metabolic Panel:  Recent Labs Lab 06/24/13 0640 06/25/13 1800 06/25/13 1816 06/26/13 0305 06/26/13 1655 06/27/13 0305  NA 140 137 138 136*  --  132*   K 5.0 4.2 4.1 4.4  --  4.8  CL  --  98 102 98  --  94*  CO2  --  23  --  23  --  22  GLUCOSE 219* 84 77 93  --  193*  BUN  --  40* 38* 41*  --  46*  CREATININE  --  6.83* 6.90* 7.30*  --  8.25*  CALCIUM  --  7.7*  --  7.9*  --  7.5*  MG  --   --   --   --  1.9  --   PHOS  --   --   --   --  5.1*  --    Liver Function Tests:  Recent Labs Lab 06/25/13 1800 06/26/13 0305 06/27/13 0305  AST 34 28 20  ALT 41* 34 29  ALKPHOS 75 66 71  BILITOT 0.4 0.4 0.4  PROT 7.4 6.2 6.7  ALBUMIN 3.0* 2.6* 2.7*    Recent Labs Lab 06/26/13 1655  LIPASE 25   CBC:  Recent Labs Lab 06/24/13 0640 06/25/13 1800 06/25/13 1816 06/27/13 0305 06/28/13 0605  WBC  --  11.2*  --  7.1 10.9*  NEUTROABS  --  7.7  --   --   --   HGB 9.2* 8.9* 9.9* 7.8* 8.0*  HCT 27.0* 26.1* 29.0* 22.8* 23.7*  MCV  --  85.3  --  85.1 86.2  PLT  --  182  --  168 162   Cardiac Enzymes:  Recent Labs Lab 06/25/13 1800 06/26/13 0545 06/26/13 1655 06/26/13 1850 06/27/13 0305  CKTOTAL  --   --  227*  --   --   TROPONINI 1.50* 0.95*  --  0.89* 0.73*   CBG:  Recent Labs Lab 06/27/13 1124 06/27/13 1620 06/28/13 0032 06/28/13 0947 06/28/13 1230  GLUCAP 219* 227* 143* 233* 210*    Recent Results (from the past 240 hour(s))  MRSA PCR SCREENING     Status: None   Collection Time    06/26/13  6:27 AM      Result Value Range Status   MRSA by PCR NEGATIVE  NEGATIVE Final   Comment:            The GeneXpert MRSA Assay (FDA     approved for NASAL specimens     only), is one component of a     comprehensive MRSA colonization     surveillance program. It is not     intended to diagnose MRSA     infection nor to guide or     monitor treatment for     MRSA infections.     Studies:  Recent x-ray studies have been reviewed in detail by the Attending Physician  Scheduled Meds:  Scheduled Meds: . aspirin EC  81 mg Oral Daily  . calcitRIOL  0.5 mcg Oral Daily  . calcium carbonate  1 tablet Oral TID WC   . [START ON 06/29/2013] ferric gluconate (FERRLECIT/NULECIT) IV  125  mg Intravenous Q M,W,F-HD  . heparin  5,000 Units Subcutaneous Q8H  . insulin aspart  0-9 Units Subcutaneous TID WC  . insulin NPH Human  10 Units Subcutaneous BID AC & HS  . labetalol  200 mg Oral BID  . minoxidil  10 mg Oral Daily    Time spent on care of this patient: 25 mins   [place]  [phone] Pager - Text Page per Shea Evans as per below:  On-Call/Text Page:      [email or web address]      password [email or web address]  If 7PM-7AM, please contact night-coverage [email or web address] Password [email or web address] 06/28/2013, 4:30 PM   LOS: 3 days

## 2013-06-29 ENCOUNTER — Inpatient Hospital Stay (HOSPITAL_COMMUNITY): Payer: Medicare Other

## 2013-06-29 LAB — RENAL FUNCTION PANEL
Albumin: 2.5 g/dL — ABNORMAL LOW (ref 3.5–5.2)
BUN: 36 mg/dL — ABNORMAL HIGH (ref 6–23)
CALCIUM: 8 mg/dL — AB (ref 8.4–10.5)
CHLORIDE: 96 meq/L (ref 96–112)
CO2: 24 meq/L (ref 19–32)
Creatinine, Ser: 7.24 mg/dL — ABNORMAL HIGH (ref 0.50–1.10)
GFR calc non Af Amer: 5 mL/min — ABNORMAL LOW (ref >90)
GFR, EST AFRICAN AMERICAN: 6 mL/min — AB (ref >90)
GLUCOSE: 86 mg/dL (ref 70–99)
Phosphorus: 5.3 mg/dL — ABNORMAL HIGH (ref 2.3–4.6)
Potassium: 4.4 mEq/L (ref 3.7–5.3)
SODIUM: 135 meq/L — AB (ref 137–147)

## 2013-06-29 LAB — CBC
HCT: 22.7 % — ABNORMAL LOW (ref 36.0–46.0)
HEMOGLOBIN: 7.7 g/dL — AB (ref 12.0–15.0)
MCH: 29.1 pg (ref 26.0–34.0)
MCHC: 33.9 g/dL (ref 30.0–36.0)
MCV: 85.7 fL (ref 78.0–100.0)
Platelets: 181 10*3/uL (ref 150–400)
RBC: 2.65 MIL/uL — ABNORMAL LOW (ref 3.87–5.11)
RDW: 13.5 % (ref 11.5–15.5)
WBC: 10.7 10*3/uL — ABNORMAL HIGH (ref 4.0–10.5)

## 2013-06-29 LAB — GLUCOSE, CAPILLARY
GLUCOSE-CAPILLARY: 83 mg/dL (ref 70–99)
GLUCOSE-CAPILLARY: 125 mg/dL — AB (ref 70–99)
GLUCOSE-CAPILLARY: 136 mg/dL — AB (ref 70–99)
GLUCOSE-CAPILLARY: 136 mg/dL — AB (ref 70–99)
Glucose-Capillary: 87 mg/dL (ref 70–99)

## 2013-06-29 MED ORDER — SODIUM CHLORIDE 0.9 % IV SOLN: 100.0000 mL | INTRAVENOUS | Status: DC | PRN

## 2013-06-29 MED ORDER — HEPARIN SODIUM (PORCINE) 1000 UNIT/ML DIALYSIS: 1000.0000 [IU] | INTRAMUSCULAR | Status: DC | PRN

## 2013-06-29 MED ORDER — PENTAFLUOROPROP-TETRAFLUOROETH EX AERO
1.0000 | INHALATION_SPRAY | CUTANEOUS | Status: DC | PRN
Start: 2013-06-29 — End: 2013-06-29

## 2013-06-29 MED ORDER — LIDOCAINE HCL (PF) 1 % IJ SOLN: 5.0000 mL | INTRAMUSCULAR | Status: DC | PRN

## 2013-06-29 MED ORDER — ONDANSETRON HCL 4 MG/2ML IJ SOLN
INTRAMUSCULAR | Status: AC
  Filled 2013-06-29: qty 2

## 2013-06-29 MED ORDER — ALTEPLASE 2 MG IJ SOLR: 2.0000 mg | Freq: Once | INTRAMUSCULAR | Status: DC | PRN

## 2013-06-29 MED ORDER — LIDOCAINE-PRILOCAINE 2.5-2.5 % EX CREA: 1.0000 "application " | TOPICAL_CREAM | CUTANEOUS | Status: DC | PRN

## 2013-06-29 MED ORDER — NEPRO/CARBSTEADY PO LIQD: 237.0000 mL | ORAL | Status: DC | PRN

## 2013-06-29 NOTE — Progress Notes (Signed)
Received call from [facility] that gastric emptying procedure could not be completed d/t pt vomiting up medicine.  Pt returned to room; no complaints of pain or nausea.  Procedure planned for tomorrow in the AM.

## 2013-06-29 NOTE — Progress Notes (Signed)
[facility] ROUNDING NOTE   Subjective:   Interval History: continues to have nausea and vomiting  No pain   Objective:  Vital signs in last 24 hours:  Temp:  [98.1 F (36.7 C)-98.8 F (37.1 C)] 98.1 F (36.7 C) (01/14 1006) Pulse Rate:  [79-93] 91 (01/14 1127) Resp:  [16-20] 16 (01/14 1006) BP: (108-199)/(55-98) 117/55 mmHg (01/14 1127) SpO2:  [95 %-99 %] 97 % (01/14 1006) Weight:  [91 kg (200 lb 9.9 oz)-92.6 kg (204 lb 2.3 oz)] 91 kg (200 lb 9.9 oz) (01/14 0933)  Weight change: -1.7 kg (-3 lb 12 oz) Filed Weights   06/27/13 2317 06/29/13 0630 06/29/13 0933  Weight: 93.5 kg (206 lb 2.1 oz) 92.6 kg (204 lb 2.3 oz) 91 kg (200 lb 9.9 oz)    Intake/Output: I/O last 3 completed shifts: In: -  Out: [id number] [Urine:325; Other:703]   Intake/Output this shift:  Total I/O In: -  Out: 2000 [Other:2000]  CVS- RRR RS- CTA ABD- BS present soft non-distended EXT- no edema   Basic Metabolic Panel:  Recent Labs Lab 06/25/13 1800 06/25/13 1816 06/26/13 0305 06/26/13 1655 06/27/13 0305 06/29/13 0637  NA 137 138 136*  --  132* 135*  K 4.2 4.1 4.4  --  4.8 4.4  CL 98 102 98  --  94* 96  CO2 23  --  23  --  22 24  GLUCOSE 84 77 93  --  193* 86  BUN 40* 38* 41*  --  46* 36*  CREATININE 6.83* 6.90* 7.30*  --  8.25* 7.24*  CALCIUM 7.7*  --  7.9*  --  7.5* 8.0*  MG  --   --   --  1.9  --   --   PHOS  --   --   --  5.1*  --  5.3*    Liver Function Tests:  Recent Labs Lab 06/25/13 1800 06/26/13 0305 06/27/13 0305 06/29/13 0637  AST 34 28 20  --   ALT 41* 34 29  --   ALKPHOS 75 66 71  --   BILITOT 0.4 0.4 0.4  --   PROT 7.4 6.2 6.7  --   ALBUMIN 3.0* 2.6* 2.7* 2.5*    Recent Labs Lab 06/26/13 1655  LIPASE 25   No results found for this basename: AMMONIA,  in the last 168 hours  CBC:  Recent Labs Lab 06/25/13 1800 06/25/13 1816 06/27/13 0305 06/28/13 0605 06/29/13 0637  WBC 11.2*  --  7.1 10.9* 10.7*  NEUTROABS 7.7  --   --   --   --    HGB 8.9* 9.9* 7.8* 8.0* 7.7*  HCT 26.1* 29.0* 22.8* 23.7* 22.7*  MCV 85.3  --  85.1 86.2 85.7  PLT 182  --  168 162 181    Cardiac Enzymes:  Recent Labs Lab 06/25/13 1800 06/26/13 0545 06/26/13 1655 06/26/13 1850 06/27/13 0305  CKTOTAL  --   --  227*  --   --   TROPONINI 1.50* 0.95*  --  0.89* 0.73*    BNP: No components found with this basename: POCBNP,   CBG:  Recent Labs Lab 06/28/13 1230 06/28/13 1719 06/28/13 2130 06/29/13 0647 06/29/13 1001  GLUCAP 210* 126* 120* 83 87    Microbiology: Results for orders placed during the hospital encounter of 06/25/13  MRSA PCR SCREENING     Status: None   Collection Time    06/26/13  6:27 AM      Result Value  Range Status   MRSA by PCR NEGATIVE  NEGATIVE Final   Comment:            The GeneXpert MRSA Assay (FDA     approved for NASAL specimens     only), is one component of a     comprehensive MRSA colonization     surveillance program. It is not     intended to diagnose MRSA     infection nor to guide or     monitor treatment for     MRSA infections.    Coagulation Studies: No results found for this basename: LABPROT, INR,  in the last 72 hours  Urinalysis: No results found for this basename: COLORURINE, APPERANCEUR, LABSPEC, PHURINE, GLUCOSEU, HGBUR, BILIRUBINUR, KETONESUR, PROTEINUR, UROBILINOGEN, NITRITE, LEUKOCYTESUR,  in the last 72 hours    Imaging: No results found.   Medications:     . aspirin EC  81 mg Oral Daily  . calcitRIOL  0.5 mcg Oral Daily  . calcium carbonate  1 tablet Oral TID WC  . ferric gluconate (FERRLECIT/NULECIT) IV  125 mg Intravenous Q M,W,F-HD  . heparin  5,000 Units Subcutaneous Q8H  . insulin aspart  0-9 Units Subcutaneous TID WC  . insulin NPH Human  15 Units Subcutaneous BID AC & HS  . labetalol  200 mg Oral BID  . minoxidil  10 mg Oral Daily   acetaminophen, acetaminophen, morphine injection, ondansetron (ZOFRAN) IV, ondansetron, oxyCODONE, zolpidem  Assessment/  Plan:  1.ESRD-new start to dialysis  2ANEMIA- iron studies show iron deficiency , start iron Getting aranesp  3 MBD- phos controlled  HTN/VOL-controlled  ACCESS- out patient appointment Dr Trula Slade    LOS: 4 Shirla Hodgkiss W @TODAY @12 :00 PM

## 2013-06-29 NOTE — Progress Notes (Signed)
Progress Note [place] TEAM 1 - Stepdown/ICU TEAM   [name] [id number] DOB: [birth year] DOA: 06/25/2013 PCP: [name], MD  Admit HPI / Brief Narrative: 73 y.o. Female with past medical history significant for diabetes, hypertension and end-stage renal disease who was recently started on dialysis who presented with the acute onset of convulsions after she finished her first dialysis. No previous history of seizures or convulsions. In the emergency room she received D50 water and juice for hypoglycemia with a blood sugar of 43, and was started on TUMS for hypocalcemia with a calcium of 1.02.  Her troponin was found to be elevated at 1.5 and and EKG showed ST depressions in inferolateral leads.  HPI/Subjective: Severe nausea w/ recurring vomiting continues.  Appears to be related to attempts to eat.  No chest pain or sob.   Assessment/Plan:  Hypoglycemia in DM likely due to vomiting prior to admission - resolved - CBG 43 at time of presentation to ER - cont to follow trend - not able to tolerate POs at present   Vomiting - does not appear to have obstruction or ileus per exam - agree w/ Dr. Wynelle Cleveland that gastroparesis is likely - await gastric empty scan results   Moderate Hypocalcemia Ionized Ca low at 1.03, but not severely low - corrected Ca was actually ~9 given low albumin of 2.6 - continue with oral replacement - follow levels - Mg and Phos not severely deranged - doubt her Ca++ level had anything to do with her presentation   Jerking movements Unclear etiology - doubt her Ca++ was low enough to be the causing these movements - pt reports they started immediately after her first HD tx - Mg and Phos not severely deranged - BUN does not appear high enough to blame this on uremia - likely simple "metabolic jerks" related to fluid shifting in new start HD pt - follow clinically - have resolved at this time   Elevated troponin Peaked at 1.50 - now trending downward - no  evidence of STEMI on EKG - f/u EKG this am is w/o acute findings - TTE w/o focal WMA   Newly Diagnosed Grade 2 Diastolic CHF Noted on TTE - volume control will be via HD - BP control will be important   ESRD Started HD on 1/10 ([facility] in [place]) - reports is supposed to be on a M/W/F routine - Nephrology following   Anemia of chronic kidney disease No evidence of acute blood loss - suspect decrease in Hgb due to dilution - follow Hgb trend - Fe/epo per Nephrology   HTN Currently reasonably controlled  Code Status: FULL Family Communication: Spoke with patient and daughters at bedside Disposition Plan: med bed - f/u nuc med study   Consultants: Nephrology   Procedures: 1/9 - Diatek catheter right internal jugular vein at AP 1/11 - TTE - mild LVH - EF 65-70% - no wall motion abnormalities - grade 2 diastolic dysfunction - moderate tricuspid regurg  Antibiotics: none  DVT prophylaxis: SCDs  Objective: Blood pressure 117/55, pulse 91, temperature 98.1 F (36.7 C), temperature source Oral, resp. rate 16, height 6' (1.829 m), weight 91 kg (200 lb 9.9 oz), SpO2 97.00%.  Intake/Output Summary (Last 24 hours) at 06/29/13 1516 Last data filed at 06/29/13 0933  Gross per 24 hour  Intake      0 ml  Output   2000 ml  Net  -2000 ml   Exam: General: No acute respiratory distress - alert and oriented  Lungs: Clear to auscultation bilaterally without wheezes or crackles Cardiovascular: Regular rate and rhythm without murmur gallop or rub  Abdomen: Nontender, nondistended, soft, bowel sounds positive, no rebound, no ascites, no appreciable mass Extremities: No significant cyanosis, clubbing, or edema bilateral lower extremities  Data Reviewed: Basic Metabolic Panel:  Recent Labs Lab 06/25/13 1800 06/25/13 1816 06/26/13 0305 06/26/13 1655 06/27/13 0305 06/29/13 0637  NA 137 138 136*  --  132* 135*  K 4.2 4.1 4.4  --  4.8 4.4  CL 98 102 98  --  94* 96  CO2 23  --  23   --  22 24  GLUCOSE 84 77 93  --  193* 86  BUN 40* 38* 41*  --  46* 36*  CREATININE 6.83* 6.90* 7.30*  --  8.25* 7.24*  CALCIUM 7.7*  --  7.9*  --  7.5* 8.0*  MG  --   --   --  1.9  --   --   PHOS  --   --   --  5.1*  --  5.3*   Liver Function Tests:  Recent Labs Lab 06/25/13 1800 06/26/13 0305 06/27/13 0305 06/29/13 0637  AST 34 28 20  --   ALT 41* 34 29  --   ALKPHOS 75 66 71  --   BILITOT 0.4 0.4 0.4  --   PROT 7.4 6.2 6.7  --   ALBUMIN 3.0* 2.6* 2.7* 2.5*    Recent Labs Lab 06/26/13 1655  LIPASE 25   CBC:  Recent Labs Lab 06/25/13 1800 06/25/13 1816 06/27/13 0305 06/28/13 0605 06/29/13 0637  WBC 11.2*  --  7.1 10.9* 10.7*  NEUTROABS 7.7  --   --   --   --   HGB 8.9* 9.9* 7.8* 8.0* 7.7*  HCT 26.1* 29.0* 22.8* 23.7* 22.7*  MCV 85.3  --  85.1 86.2 85.7  PLT 182  --  168 162 181   Cardiac Enzymes:  Recent Labs Lab 06/25/13 1800 06/26/13 0545 06/26/13 1655 06/26/13 1850 06/27/13 0305  CKTOTAL  --   --  227*  --   --   TROPONINI 1.50* 0.95*  --  0.89* 0.73*   CBG:  Recent Labs Lab 06/28/13 1719 06/28/13 2130 06/29/13 0647 06/29/13 1001 06/29/13 1219  GLUCAP 126* 120* 83 87 125*    Recent Results (from the past 240 hour(s))  MRSA PCR SCREENING     Status: None   Collection Time    06/26/13  6:27 AM      Result Value Range Status   MRSA by PCR NEGATIVE  NEGATIVE Final   Comment:            The GeneXpert MRSA Assay (FDA     approved for NASAL specimens     only), is one component of a     comprehensive MRSA colonization     surveillance program. It is not     intended to diagnose MRSA     infection nor to guide or     monitor treatment for     MRSA infections.     Studies:  Recent x-ray studies have been reviewed in detail by the Attending Physician  Scheduled Meds:  Scheduled Meds: . aspirin EC  81 mg Oral Daily  . calcitRIOL  0.5 mcg Oral Daily  . calcium carbonate  1 tablet Oral TID WC  . ferric gluconate  (FERRLECIT/NULECIT) IV  125 mg Intravenous Q M,W,F-HD  . heparin  5,000 Units Subcutaneous  Q8H  . insulin aspart  0-9 Units Subcutaneous TID WC  . insulin NPH Human  15 Units Subcutaneous BID AC & HS  . labetalol  200 mg Oral BID  . minoxidil  10 mg Oral Daily    Time spent on care of this patient: 25 mins   [place]  [phone] Pager - Text Page per Shea Evans as per below:  On-Call/Text Page:      [email or web address]      password [email or web address]  If 7PM-7AM, please contact night-coverage [email or web address] Password [email or web address] 06/29/2013, 3:16 PM   LOS: 4 days

## 2013-06-30 ENCOUNTER — Inpatient Hospital Stay (HOSPITAL_COMMUNITY): Payer: Medicare Other

## 2013-06-30 DIAGNOSIS — R112 Nausea with vomiting, unspecified: Secondary | ICD-10-CM | POA: Diagnosis present

## 2013-06-30 LAB — BASIC METABOLIC PANEL
BUN: 26 mg/dL — ABNORMAL HIGH (ref 6–23)
CHLORIDE: 92 meq/L — AB (ref 96–112)
CO2: 24 mEq/L (ref 19–32)
CREATININE: 5.89 mg/dL — AB (ref 0.50–1.10)
Calcium: 8.3 mg/dL — ABNORMAL LOW (ref 8.4–10.5)
GFR, EST AFRICAN AMERICAN: 7 mL/min — AB (ref >90)
GFR, EST NON AFRICAN AMERICAN: 6 mL/min — AB (ref >90)
Glucose, Bld: 110 mg/dL — ABNORMAL HIGH (ref 70–99)
Potassium: 4.5 mEq/L (ref 3.7–5.3)
Sodium: 134 mEq/L — ABNORMAL LOW (ref 137–147)

## 2013-06-30 LAB — TROPONIN I: Troponin I: <0.3 ng/mL (ref <0.30)

## 2013-06-30 LAB — CBC
HEMATOCRIT: 24.7 % — AB (ref 36.0–46.0)
Hemoglobin: 8.2 g/dL — ABNORMAL LOW (ref 12.0–15.0)
MCH: 29.1 pg (ref 26.0–34.0)
MCHC: 33.2 g/dL (ref 30.0–36.0)
MCV: 87.6 fL (ref 78.0–100.0)
PLATELETS: 199 10*3/uL (ref 150–400)
RBC: 2.82 MIL/uL — ABNORMAL LOW (ref 3.87–5.11)
RDW: 13.4 % (ref 11.5–15.5)
WBC: 9.8 10*3/uL (ref 4.0–10.5)

## 2013-06-30 LAB — GLUCOSE, CAPILLARY
GLUCOSE-CAPILLARY: 98 mg/dL (ref 70–99)
GLUCOSE-CAPILLARY: 162 mg/dL — AB (ref 70–99)
Glucose-Capillary: 108 mg/dL — ABNORMAL HIGH (ref 70–99)
Glucose-Capillary: 88 mg/dL (ref 70–99)

## 2013-06-30 MED ORDER — TECHNETIUM TC 99M MEBROFENIN IV KIT: 5.0000 | PACK | Freq: Once | INTRAVENOUS | Status: AC | PRN

## 2013-06-30 MED ORDER — TECHNETIUM TC 99M SULFUR COLLOID
2.0000 | Freq: Once | INTRAVENOUS | Status: AC | PRN
  Administered 2013-06-30: 2 via INTRAVENOUS

## 2013-06-30 MED ORDER — LORAZEPAM 2 MG/ML IJ SOLN
0.5000 mg | Freq: Once | INTRAMUSCULAR | Status: AC
  Administered 2013-06-30: 0.5 mg via INTRAVENOUS
  Filled 2013-06-30: qty 1

## 2013-06-30 MED ORDER — INSULIN NPH (HUMAN) (ISOPHANE) 100 UNIT/ML ~~LOC~~ SUSP
10.0000 [IU] | Freq: Two times a day (BID) | SUBCUTANEOUS | Status: DC
  Administered 2013-06-30 – 2013-07-04 (×8): 10 [IU] via SUBCUTANEOUS
  Filled 2013-06-30: qty 10

## 2013-06-30 MED ORDER — PANTOPRAZOLE SODIUM 40 MG IV SOLR
40.0000 mg | INTRAVENOUS | Status: DC
  Administered 2013-06-30 – 2013-07-01 (×2): 40 mg via INTRAVENOUS
  Filled 2013-06-30 (×3): qty 40

## 2013-06-30 MED ORDER — HALOPERIDOL LACTATE 5 MG/ML IJ SOLN
0.5000 mg | Freq: Once | INTRAMUSCULAR | Status: AC
  Administered 2013-06-30: 0.5 mg via INTRAVENOUS
  Filled 2013-06-30: qty 0.1

## 2013-06-30 MED ORDER — HALOPERIDOL LACTATE 5 MG/ML IJ SOLN
1.0000 mg | Freq: Four times a day (QID) | INTRAMUSCULAR | Status: DC | PRN
  Filled 2013-06-30: qty 1

## 2013-06-30 NOTE — Progress Notes (Signed)
Pt is still combative and confused. Barbara Najjar, NP notified. Haldol ordered and administered. Pt is now asleep. Will continue to monitor.

## 2013-06-30 NOTE — Progress Notes (Signed)
Pt is slightly agitated and confused. She does not know her daughters name or the year. Pt given a bath and is now asleep. Will continue to monitor.

## 2013-06-30 NOTE — Progress Notes (Signed)
Called by RN Jenny Reichmann for second set of eyes assessment on patient with increased lethargy.  On arrival patient skin cool and dry - sleeping soundly - arouses only to DPS and then with mostly frown and moan. BP 98/55 HR 75 RR 18 - shallow - bil BS = clear - O2 sats 100% on RA.  CBG 98.   RN reports patient had Ambien last PM and became combative - the had Ativan and Haldol - has been sleepy above baseline most of day but in last 15 mins will not respond although daughters in room and state she was talking 15 mins prior to my arrival.  She is new HD patient times one week and was admitted with tremors post first HD tx - seizure being ruled out.  Had HD yesterday and tol.  Rectal temp checked - 97.6.  During 15 mins in room with patient she had intermittent periods of simple command following - squeezed and released with her left hand -nodding yes when asked if she could hear.  Then became nauseated with dry heaving noted - Zofran 4 mg IV given per RN Cindy per order.  At this time patient with some spont speech - crying "Oh my lord - i am sick to stomach ' she nods yes and no on occasion - denies pain - no pain reaction to abd palpitation- abd soft - no focal neuro deficit noted - RN Jenny Reichmann spoke with Dr. Wynelle Cleveland -- orders noted.  Call as needed.

## 2013-06-30 NOTE — Progress Notes (Signed)
Throughout morning, pt was acting confused. Around 1300 Pt became lethargic. Rapid Response nurse was called to assess pt. MD also paged. Vitals and blood sugar were obtained. Rapid Response RN checked pt and pt became more alert. Nurse was told to keep a close eye on pt's status. MD changed BP medication parameters. Will continue to monitor per MD orders.

## 2013-06-30 NOTE — Progress Notes (Addendum)
Progress Note [place] TEAM 1 - Stepdown/ICU TEAM   [name] [id number] DOB: 03/25/1941 DOA: 06/25/2013 PCP: [name], MD  Admit HPI / Brief Narrative: 73 y.o. Female with past medical history significant for diabetes, hypertension and end-stage renal disease who was recently started on dialysis who presented with the acute onset of convulsions after she finished her first dialysis. No previous history of seizures or convulsions. In the emergency room she received D50 water and juice for hypoglycemia with a blood sugar of 43, and was started on TUMS for hypocalcemia with a calcium of 1.02.  Her troponin was found to be elevated at 1.5 and and EKG showed ST depressions in inferolateral leads.  HPI/Subjective: Continues to have nausea. Received Ambien last night and became confused and agitated. Subsequently given Ativan and has slept most of the day- evaluated this AM, was sleepy but awakened for a short period- did not converse- fell asleep easily. I had a detailed discussion with daughters at bedside regarding plan. Per my conversation with the RN just now, she is alert, oriented and still c/o nausea. Having GES performed now.   Assessment/Plan:  Hypoglycemia in DM likely due to vomiting prior to admission - resolved - CBG 43 at time of presentation to ER  - not able to tolerate POs at present - cont low dose NPH  Vomiting - does not appear to have obstruction or ileus per exam - Gastroparesis is likely - await gastric empty scan results  - start Protonix in case this may be related to PUD  Confusion/ Delirium  - reaction to Ambien- now on allergy list  Moderate Hypocalcemia Ionized Ca low at 1.03, but not severely low - corrected Ca was actually ~9 given low albumin of 2.6 - continue with oral replacement - follow levels - Mg and Phos not severely deranged - doubt her Ca++ level had anything to do with her presentation   Jerking movements Unclear etiology - doubt  her Ca++ was low enough to be the causing these movements - pt reports they started immediately after her first HD tx - Mg and Phos not severely deranged - BUN does not appear high enough to blame this on uremia - likely simple "metabolic jerks" related to fluid shifting in new start HD pt  - have resolved at this time   Elevated troponin Peaked at 1.50 - now trending downward - no evidence of STEMI on EKG - f/u EKG this am is w/o acute findings - TTE w/o focal WMA   Newly Diagnosed Grade 2 Diastolic CHF Noted on TTE - volume control will be via HD - BP control will be important   ESRD Started HD on 1/10 ([facility] in [place]) - reports is supposed to be on a M/W/F routine - Nephrology following   Anemia of chronic kidney disease No evidence of acute blood loss - suspect decrease in Hgb due to dilution - follow Hgb trend - Fe/epo per Nephrology   HTN Currently reasonably controlled  Code Status: FULL Family Communication: Spoke with patient and daughters at bedside Disposition Plan: med bed - f/u nuc med study   Consultants: Nephrology   Procedures: 1/9 - Diatek catheter right internal jugular vein at AP 1/11 - TTE - mild LVH - EF 65-70% - no wall motion abnormalities - grade 2 diastolic dysfunction - moderate tricuspid regurg  Antibiotics: none  DVT prophylaxis: SCDs  Objective: Blood pressure 98/55, pulse 79, temperature 97.1 F (36.2 C), temperature source Rectal, resp. rate  12, height 6' (1.829 m), weight 91 kg (200 lb 9.9 oz), SpO2 98.00%. No intake or output data in the 24 hours ending 06/30/13 1628 Exam: General: No acute respiratory distress - sleepy Lungs: Clear to auscultation bilaterally without wheezes or crackles Cardiovascular: Regular rate and rhythm without murmur gallop or rub  Abdomen: Nontender, nondistended, soft, bowel sounds positive, no rebound, no ascites, no appreciable mass Extremities: No significant cyanosis, clubbing, or edema bilateral lower  extremities  Data Reviewed: Basic Metabolic Panel:  Recent Labs Lab 06/25/13 1800 06/25/13 1816 06/26/13 0305 06/26/13 1655 06/27/13 0305 06/29/13 0637 06/30/13 0742  NA 137 138 136*  --  132* 135* 134*  K 4.2 4.1 4.4  --  4.8 4.4 4.5  CL 98 102 98  --  94* 96 92*  CO2 23  --  23  --  22 24 24   GLUCOSE 84 77 93  --  193* 86 110*  BUN 40* 38* 41*  --  46* 36* 26*  CREATININE 6.83* 6.90* 7.30*  --  8.25* 7.24* 5.89*  CALCIUM 7.7*  --  7.9*  --  7.5* 8.0* 8.3*  MG  --   --   --  1.9  --   --   --   PHOS  --   --   --  5.1*  --  5.3*  --    Liver Function Tests:  Recent Labs Lab 06/25/13 1800 06/26/13 0305 06/27/13 0305 06/29/13 0637  AST 34 28 20  --   ALT 41* 34 29  --   ALKPHOS 75 66 71  --   BILITOT 0.4 0.4 0.4  --   PROT 7.4 6.2 6.7  --   ALBUMIN 3.0* 2.6* 2.7* 2.5*    Recent Labs Lab 06/26/13 1655  LIPASE 25   CBC:  Recent Labs Lab 06/25/13 1800 06/25/13 1816 06/27/13 0305 06/28/13 0605 06/29/13 0637 06/30/13 0742  WBC 11.2*  --  7.1 10.9* 10.7* 9.8  NEUTROABS 7.7  --   --   --   --   --   HGB 8.9* 9.9* 7.8* 8.0* 7.7* 8.2*  HCT 26.1* 29.0* 22.8* 23.7* 22.7* 24.7*  MCV 85.3  --  85.1 86.2 85.7 87.6  PLT 182  --  168 162 181 199   Cardiac Enzymes:  Recent Labs Lab 06/25/13 1800 06/26/13 0545 06/26/13 1655 06/26/13 1850 06/27/13 0305 06/30/13 0742  CKTOTAL  --   --  227*  --   --   --   TROPONINI 1.50* 0.95*  --  0.89* 0.73* <0.30   CBG:  Recent Labs Lab 06/29/13 1709 06/29/13 2213 06/30/13 0744 06/30/13 1231 06/30/13 1330  GLUCAP 136* 136* 108* 88 98    Recent Results (from the past 240 hour(s))  MRSA PCR SCREENING     Status: None   Collection Time    06/26/13  6:27 AM      Result Value Range Status   MRSA by PCR NEGATIVE  NEGATIVE Final   Comment:            The GeneXpert MRSA Assay (FDA     approved for NASAL specimens     only), is one component of a     comprehensive MRSA colonization     surveillance program. It  is not     intended to diagnose MRSA     infection nor to guide or     monitor treatment for     MRSA infections.  Studies:  Recent x-ray studies have been reviewed in detail by the Attending Physician  Scheduled Meds:  Scheduled Meds: . aspirin EC  81 mg Oral Daily  . calcitRIOL  0.5 mcg Oral Daily  . calcium carbonate  1 tablet Oral TID WC  . ferric gluconate (FERRLECIT/NULECIT) IV  125 mg Intravenous Q M,W,F-HD  . heparin  5,000 Units Subcutaneous Q8H  . insulin aspart  0-9 Units Subcutaneous TID WC  . insulin NPH Human  10 Units Subcutaneous BID AC & HS  . labetalol  200 mg Oral BID  . minoxidil  10 mg Oral Daily  . pantoprazole (PROTONIX) IV  40 mg Intravenous Q24H    Time spent on care of this patient: 25 mins   Debbe Odea, MD  [facility] Office  [phone] Pager - Text Page per Shea Evans as per below:  On-Call/Text Page:      [email or web address]      password [email or web address]  If 7PM-7AM, please contact night-coverage [email or web address] Password [email or web address] 06/30/2013, 4:28 PM   LOS: 5 days

## 2013-06-30 NOTE — Progress Notes (Signed)
[facility] ROUNDING NOTE   Subjective:   Interval History: resting comfortably  Objective:  Vital signs in last 24 hours:  Temp:  [98.7 F (37.1 C)-98.8 F (37.1 C)] 98.7 F (37.1 C) (01/15 0559) Pulse Rate:  [75-91] 75 (01/15 1000) Resp:  [18] 18 (01/15 0559) BP: (100-123)/(55-65) 110/60 mmHg (01/15 1000) SpO2:  [93 %-98 %] 93 % (01/15 0559)  Weight change: -1.6 kg (-3 lb 8.4 oz) Filed Weights   06/27/13 2317 06/29/13 0630 06/29/13 0933  Weight: 93.5 kg (206 lb 2.1 oz) 92.6 kg (204 lb 2.3 oz) 91 kg (200 lb 9.9 oz)    Intake/Output: I/O last 3 completed shifts: In: -  Out: 2000 [Other:2000]   Intake/Output this shift:     CVS- RRR RS- CTA ABD- BS present soft non-distended EXT- no edema   Basic Metabolic Panel:  Recent Labs Lab 06/25/13 1800 06/25/13 1816 06/26/13 0305 06/26/13 1655 06/27/13 0305 06/29/13 0637 06/30/13 0742  NA 137 138 136*  --  132* 135* 134*  K 4.2 4.1 4.4  --  4.8 4.4 4.5  CL 98 102 98  --  94* 96 92*  CO2 23  --  23  --  22 24 24   GLUCOSE 84 77 93  --  193* 86 110*  BUN 40* 38* 41*  --  46* 36* 26*  CREATININE 6.83* 6.90* 7.30*  --  8.25* 7.24* 5.89*  CALCIUM 7.7*  --  7.9*  --  7.5* 8.0* 8.3*  MG  --   --   --  1.9  --   --   --   PHOS  --   --   --  5.1*  --  5.3*  --     Liver Function Tests:  Recent Labs Lab 06/25/13 1800 06/26/13 0305 06/27/13 0305 06/29/13 0637  AST 34 28 20  --   ALT 41* 34 29  --   ALKPHOS 75 66 71  --   BILITOT 0.4 0.4 0.4  --   PROT 7.4 6.2 6.7  --   ALBUMIN 3.0* 2.6* 2.7* 2.5*    Recent Labs Lab 06/26/13 1655  LIPASE 25   No results found for this basename: AMMONIA,  in the last 168 hours  CBC:  Recent Labs Lab 06/25/13 1800 06/25/13 1816 06/27/13 0305 06/28/13 0605 06/29/13 0637 06/30/13 0742  WBC 11.2*  --  7.1 10.9* 10.7* 9.8  NEUTROABS 7.7  --   --   --   --   --   HGB 8.9* 9.9* 7.8* 8.0* 7.7* 8.2*  HCT 26.1* 29.0* 22.8* 23.7* 22.7* 24.7*  MCV 85.3  --   85.1 86.2 85.7 87.6  PLT 182  --  168 162 181 199    Cardiac Enzymes:  Recent Labs Lab 06/25/13 1800 06/26/13 0545 06/26/13 1655 06/26/13 1850 06/27/13 0305 06/30/13 0742  CKTOTAL  --   --  227*  --   --   --   TROPONINI 1.50* 0.95*  --  0.89* 0.73* <0.30    BNP: No components found with this basename: POCBNP,   CBG:  Recent Labs Lab 06/29/13 1001 06/29/13 1219 06/29/13 1709 06/29/13 2213 06/30/13 0744  GLUCAP 87 125* 136* 136* 108*    Microbiology: Results for orders placed during the hospital encounter of 06/25/13  MRSA PCR SCREENING     Status: None   Collection Time    06/26/13  6:27 AM      Result Value Range Status  MRSA by PCR NEGATIVE  NEGATIVE Final   Comment:            The GeneXpert MRSA Assay (FDA     approved for NASAL specimens     only), is one component of a     comprehensive MRSA colonization     surveillance program. It is not     intended to diagnose MRSA     infection nor to guide or     monitor treatment for     MRSA infections.    Coagulation Studies: No results found for this basename: LABPROT, INR,  in the last 72 hours  Urinalysis: No results found for this basename: COLORURINE, APPERANCEUR, LABSPEC, PHURINE, GLUCOSEU, HGBUR, BILIRUBINUR, KETONESUR, PROTEINUR, UROBILINOGEN, NITRITE, LEUKOCYTESUR,  in the last 72 hours    Imaging: No results found.   Medications:     . aspirin EC  81 mg Oral Daily  . calcitRIOL  0.5 mcg Oral Daily  . calcium carbonate  1 tablet Oral TID WC  . ferric gluconate (FERRLECIT/NULECIT) IV  125 mg Intravenous Q M,W,F-HD  . heparin  5,000 Units Subcutaneous Q8H  . insulin aspart  0-9 Units Subcutaneous TID WC  . insulin NPH Human  10 Units Subcutaneous BID AC & HS  . labetalol  200 mg Oral BID  . minoxidil  10 mg Oral Daily   acetaminophen, acetaminophen, haloperidol lactate, morphine injection, ondansetron (ZOFRAN) IV, ondansetron, oxyCODONE  Assessment/ Plan:  1.ESRD-new start to  dialysis  2ANEMIA- iron studies show iron deficiency , start iron Getting aranesp  3 MBD- phos controlled  HTN/VOL-controlled  ACCESS- out patient appointment Dr Trula Slade    LOS: 5 Alexsandria Kivett W @TODAY @11 :17 AM

## 2013-06-30 NOTE — Progress Notes (Signed)
Pt was given ambien 5mg  at 2321. Nurse was called into the room by daughter and Nurse tech because she was being combative and trying to get out of bed. Baltazar Najjar, NP was notified, 0.5mg  of ativan IV was administered and the order for Lorrin Mais was dicontinued. Pt is confused and trying to hit staff and daughter. Will continue to monitor.

## 2013-06-30 NOTE — Care Management Note (Unsigned)
    Page 1 of 1   06/30/2013     10:36:26 AM   CARE MANAGEMENT NOTE 06/30/2013  Patient:  [name], [name]   Account Number:  [id number]  Date Initiated:  06/27/2013  Documentation initiated by:  Marvetta Gibbons  Subjective/Objective Assessment:   Pt admitted with hypocalcemia     Action/Plan:   PTA pt lived at home- NCM to follow for d/c needs- pt just started outpt  HD tx on 06/25/13   Anticipated DC Date:  07/01/2013   Anticipated DC Plan:  [place]  CM consult      Choice offered to / List presented to:             Status of service:  In process, will continue to follow Medicare Important Message given?   (If response is "NO", the following Medicare IM given date fields will be blank) Date Medicare IM given:   Date Additional Medicare IM given:    Discharge Disposition:    Per UR Regulation:  Reviewed for med. necessity/level of care/duration of stay  If discussed at [place] of Stay Meetings, dates discussed:   06/30/2013    Comments:  06/30/13 10:34 Tomi Bamberger RN, BSN [phone] patient can not seem to tolerate her renal diet, she is new HD patient. Patient is for a gastric emptying study today. [place] for dc home when patient is able to tolerate diet.

## 2013-07-01 DIAGNOSIS — K3184 Gastroparesis: Secondary | ICD-10-CM

## 2013-07-01 DIAGNOSIS — E1143 Type 2 diabetes mellitus with diabetic autonomic (poly)neuropathy: Secondary | ICD-10-CM | POA: Diagnosis present

## 2013-07-01 DIAGNOSIS — E1149 Type 2 diabetes mellitus with other diabetic neurological complication: Secondary | ICD-10-CM

## 2013-07-01 LAB — GLUCOSE, CAPILLARY
Glucose-Capillary: 116 mg/dL — ABNORMAL HIGH (ref 70–99)
Glucose-Capillary: 130 mg/dL — ABNORMAL HIGH (ref 70–99)
Glucose-Capillary: 194 mg/dL — ABNORMAL HIGH (ref 70–99)

## 2013-07-01 MED ORDER — LIDOCAINE-PRILOCAINE 2.5-2.5 % EX CREA: 1.0000 "application " | TOPICAL_CREAM | CUTANEOUS | Status: DC | PRN

## 2013-07-01 MED ORDER — PENTAFLUOROPROP-TETRAFLUOROETH EX AERO: 1.0000 "application " | INHALATION_SPRAY | CUTANEOUS | Status: DC | PRN

## 2013-07-01 MED ORDER — SODIUM CHLORIDE 0.9 % IV SOLN: 100.0000 mL | INTRAVENOUS | Status: DC | PRN

## 2013-07-01 MED ORDER — LIDOCAINE HCL (PF) 1 % IJ SOLN: 5.0000 mL | INTRAMUSCULAR | Status: DC | PRN

## 2013-07-01 MED ORDER — NEPRO/CARBSTEADY PO LIQD
237.0000 mL | ORAL | Status: DC | PRN
  Filled 2013-07-01: qty 237

## 2013-07-01 MED ORDER — HEPARIN SODIUM (PORCINE) 1000 UNIT/ML DIALYSIS: 1000.0000 [IU] | INTRAMUSCULAR | Status: DC | PRN

## 2013-07-01 MED ORDER — ALTEPLASE 2 MG IJ SOLR
2.0000 mg | Freq: Once | INTRAMUSCULAR | Status: DC | PRN
  Filled 2013-07-01: qty 2

## 2013-07-01 MED ORDER — NEPRO/CARBSTEADY PO LIQD: 237.0000 mL | ORAL | Status: DC | PRN

## 2013-07-01 MED ORDER — METOCLOPRAMIDE HCL 5 MG/ML IJ SOLN
5.0000 mg | Freq: Four times a day (QID) | INTRAMUSCULAR | Status: DC
  Administered 2013-07-01 – 2013-07-02 (×5): 5 mg via INTRAVENOUS
  Filled 2013-07-01 (×10): qty 1

## 2013-07-01 MED ORDER — HEPARIN SODIUM (PORCINE) 1000 UNIT/ML DIALYSIS
1000.0000 [IU] | INTRAMUSCULAR | Status: DC | PRN
Start: 2013-07-01 — End: 2013-07-01

## 2013-07-01 NOTE — Progress Notes (Signed)
Progress Note [place] TEAM 1 - Stepdown/ICU TEAM   [name] [id number] DOB: 08/07/1940 DOA: 06/25/2013 PCP: [name], MD  Admit HPI / Brief Narrative: 73 y.o. Female with past medical history significant for diabetes, hypertension and end-stage renal disease who was recently started on dialysis who presented with the acute onset of convulsions after she finished her first dialysis. No previous history of seizures or convulsions. In the emergency room she received D50 water and juice for hypoglycemia with a blood sugar of 43, and was started on TUMS for hypocalcemia with a calcium of 1.02.  Her troponin was found to be elevated at 1.5 and and EKG showed ST depressions in inferolateral leads.  HPI/Subjective: Continues to have nausea. Received Ambien last night and became confused and agitated. Subsequently given Ativan and has slept most of the day- evaluated this AM, was sleepy but awakened for a short period- did not converse- fell asleep easily. I had a detailed discussion with daughters at bedside regarding plan. Per my conversation with the RN just now, she is alert, oriented and still c/o nausea. Having GES performed now.   Assessment/Plan:  Hypoglycemia in DM likely due to vomiting prior to admission - resolved - CBG 43 at time of presentation to ER  - not able to tolerate POs at present - cont low dose NPH  Vomiting- gastroparesis - does not appear to have obstruction or ileus per exam - gastric empty scan reveals gastroparesis- Reglan started- allow diet and follow - start Protonix in case this may be related to PUD as well  Confusion/ Delirium  - reaction to Ambien- now on allergy list - also appears to have sundowning  Moderate Hypocalcemia Ionized Ca low at 1.03, but not severely low - corrected Ca was actually ~9 given low albumin of 2.6 - continue with oral replacement - follow levels - Mg and Phos not severely deranged - doubt her Ca++ level had anything  to do with her presentation   Jerking movements Unclear etiology - doubt her Ca++ was low enough to be the causing these movements - pt reports they started immediately after her first HD tx - Mg and Phos not severely deranged - BUN does not appear high enough to blame this on uremia - likely simple "metabolic jerks" related to fluid shifting in new start HD pt  - have resolved at this time   Elevated troponin Peaked at 1.50 - now trending downward - no evidence of STEMI on EKG - f/u EKG this am is w/o acute findings - TTE w/o focal WMA   Newly Diagnosed Grade 2 Diastolic CHF Noted on TTE - volume control will be via HD - BP control will be important   ESRD Started HD on 1/10 ([facility] in [place]) - reports is supposed to be on a M/W/F routine - Nephrology following   Anemia of chronic kidney disease No evidence of acute blood loss - suspect decrease in Hgb due to dilution - follow Hgb trend - Fe/epo per Nephrology   HTN Currently reasonably controlled  Code Status: FULL Family Communication: Spoke with patient and daughters at bedside Disposition Plan: med bed - f/u nuc med study   Consultants: Nephrology   Procedures: 1/9 - Diatek catheter right internal jugular vein at AP 1/11 - TTE - mild LVH - EF 65-70% - no wall motion abnormalities - grade 2 diastolic dysfunction - moderate tricuspid regurg  Antibiotics: none  DVT prophylaxis: SCDs  Objective: Blood pressure 105/51, pulse 82,  temperature 98.2 F (36.8 C), temperature source Oral, resp. rate 18, height 6' (1.829 m), weight 89.3 kg (196 lb 13.9 oz), SpO2 95.00%.  Intake/Output Summary (Last 24 hours) at 07/01/13 1626 Last data filed at 07/01/13 1450  Gross per 24 hour  Intake     90 ml  Output    754 ml  Net   -664 ml   Exam: General: No acute respiratory distress -  Lungs: Clear to auscultation bilaterally without wheezes or crackles Cardiovascular: Regular rate and rhythm without murmur gallop or rub    Abdomen: Nontender, nondistended, soft, bowel sounds positive, no rebound, no ascites, no appreciable mass Extremities: No significant cyanosis, clubbing, or edema bilateral lower extremities  Data Reviewed: Basic Metabolic Panel:  Recent Labs Lab 06/25/13 1800 06/25/13 1816 06/26/13 0305 06/26/13 1655 06/27/13 0305 06/29/13 0637 06/30/13 0742  NA 137 138 136*  --  132* 135* 134*  K 4.2 4.1 4.4  --  4.8 4.4 4.5  CL 98 102 98  --  94* 96 92*  CO2 23  --  23  --  22 24 24   GLUCOSE 84 77 93  --  193* 86 110*  BUN 40* 38* 41*  --  46* 36* 26*  CREATININE 6.83* 6.90* 7.30*  --  8.25* 7.24* 5.89*  CALCIUM 7.7*  --  7.9*  --  7.5* 8.0* 8.3*  MG  --   --   --  1.9  --   --   --   PHOS  --   --   --  5.1*  --  5.3*  --    Liver Function Tests:  Recent Labs Lab 06/25/13 1800 06/26/13 0305 06/27/13 0305 06/29/13 0637  AST 34 28 20  --   ALT 41* 34 29  --   ALKPHOS 75 66 71  --   BILITOT 0.4 0.4 0.4  --   PROT 7.4 6.2 6.7  --   ALBUMIN 3.0* 2.6* 2.7* 2.5*    Recent Labs Lab 06/26/13 1655  LIPASE 25   CBC:  Recent Labs Lab 06/25/13 1800 06/25/13 1816 06/27/13 0305 06/28/13 0605 06/29/13 0637 06/30/13 0742  WBC 11.2*  --  7.1 10.9* 10.7* 9.8  NEUTROABS 7.7  --   --   --   --   --   HGB 8.9* 9.9* 7.8* 8.0* 7.7* 8.2*  HCT 26.1* 29.0* 22.8* 23.7* 22.7* 24.7*  MCV 85.3  --  85.1 86.2 85.7 87.6  PLT 182  --  168 162 181 199   Cardiac Enzymes:  Recent Labs Lab 06/25/13 1800 06/26/13 0545 06/26/13 1655 06/26/13 1850 06/27/13 0305 06/30/13 0742  CKTOTAL  --   --  227*  --   --   --   TROPONINI 1.50* 0.95*  --  0.89* 0.73* <0.30   CBG:  Recent Labs Lab 06/30/13 0744 06/30/13 1231 06/30/13 1330 06/30/13 2131 07/01/13 0741  GLUCAP 108* 88 98 162* 116*    Recent Results (from the past 240 hour(s))  MRSA PCR SCREENING     Status: None   Collection Time    06/26/13  6:27 AM      Result Value Range Status   MRSA by PCR NEGATIVE  NEGATIVE Final    Comment:            The GeneXpert MRSA Assay (FDA     approved for NASAL specimens     only), is one component of a     comprehensive MRSA colonization  surveillance program. It is not     intended to diagnose MRSA     infection nor to guide or     monitor treatment for     MRSA infections.     Studies:  Recent x-ray studies have been reviewed in detail by the Attending Physician  Scheduled Meds:  Scheduled Meds: . aspirin EC  81 mg Oral Daily  . calcitRIOL  0.5 mcg Oral Daily  . calcium carbonate  1 tablet Oral TID WC  . ferric gluconate (FERRLECIT/NULECIT) IV  125 mg Intravenous Q M,W,F-HD  . heparin  5,000 Units Subcutaneous Q8H  . insulin aspart  0-9 Units Subcutaneous TID WC  . insulin NPH Human  10 Units Subcutaneous BID AC & HS  . labetalol  200 mg Oral BID  . metoCLOPramide (REGLAN) injection  5 mg Intravenous Q6H  . minoxidil  10 mg Oral Daily  . pantoprazole (PROTONIX) IV  40 mg Intravenous Q24H    Time spent on care of this patient: 25 mins   Debbe Odea, MD  [facility] Office  [phone] Pager - Text Page per Shea Evans as per below:  On-Call/Text Page:      [email or web address]      password [email or web address]  If 7PM-7AM, please contact night-coverage [email or web address] Password [email or web address] 07/01/2013, 4:26 PM   LOS: 6 days

## 2013-07-01 NOTE — Progress Notes (Signed)
Just spoke with Erasmo Downer in hemodialysis. I informed her of the Meds I gave pt. Erasmo Downer informed me that she is coming up shortly to take pt. To dialysis and will inform MD of BP meds.

## 2013-07-01 NOTE — Progress Notes (Signed)
[facility] ROUNDING NOTE   Subjective:   Interval History: gastroparesis per study still nausea   Objective:  Vital signs in last 24 hours:  Temp:  [97.1 F (36.2 C)-98.5 F (36.9 C)] 98.5 F (36.9 C) (01/16 1140) Pulse Rate:  [79-92] 86 (01/16 1140) Resp:  [12-18] 18 (01/16 1140) BP: (96-136)/(51-64) 123/59 mmHg (01/16 1140) SpO2:  [95 %-98 %] 95 % (01/16 1140) Weight:  [90 kg (198 lb 6.6 oz)] 90 kg (198 lb 6.6 oz) (01/16 1140)  Weight change:  Filed Weights   06/29/13 0630 06/29/13 0933 07/01/13 1140  Weight: 92.6 kg (204 lb 2.3 oz) 91 kg (200 lb 9.9 oz) 90 kg (198 lb 6.6 oz)    Intake/Output:     Intake/Output this shift:  Total I/O In: 90 [P.O.:90] Out: -   CVS- RRR RS- CTA ABD- BS present soft non-distended EXT- no edema   Basic Metabolic Panel:  Recent Labs Lab 06/25/13 1800 06/25/13 1816 06/26/13 0305 06/26/13 1655 06/27/13 0305 06/29/13 0637 06/30/13 0742  NA 137 138 136*  --  132* 135* 134*  K 4.2 4.1 4.4  --  4.8 4.4 4.5  CL 98 102 98  --  94* 96 92*  CO2 23  --  23  --  22 24 24   GLUCOSE 84 77 93  --  193* 86 110*  BUN 40* 38* 41*  --  46* 36* 26*  CREATININE 6.83* 6.90* 7.30*  --  8.25* 7.24* 5.89*  CALCIUM 7.7*  --  7.9*  --  7.5* 8.0* 8.3*  MG  --   --   --  1.9  --   --   --   PHOS  --   --   --  5.1*  --  5.3*  --     Liver Function Tests:  Recent Labs Lab 06/25/13 1800 06/26/13 0305 06/27/13 0305 06/29/13 0637  AST 34 28 20  --   ALT 41* 34 29  --   ALKPHOS 75 66 71  --   BILITOT 0.4 0.4 0.4  --   PROT 7.4 6.2 6.7  --   ALBUMIN 3.0* 2.6* 2.7* 2.5*    Recent Labs Lab 06/26/13 1655  LIPASE 25   No results found for this basename: AMMONIA,  in the last 168 hours  CBC:  Recent Labs Lab 06/25/13 1800 06/25/13 1816 06/27/13 0305 06/28/13 0605 06/29/13 0637 06/30/13 0742  WBC 11.2*  --  7.1 10.9* 10.7* 9.8  NEUTROABS 7.7  --   --   --   --   --   HGB 8.9* 9.9* 7.8* 8.0* 7.7* 8.2*  HCT 26.1*  29.0* 22.8* 23.7* 22.7* 24.7*  MCV 85.3  --  85.1 86.2 85.7 87.6  PLT 182  --  168 162 181 199    Cardiac Enzymes:  Recent Labs Lab 06/25/13 1800 06/26/13 0545 06/26/13 1655 06/26/13 1850 06/27/13 0305 06/30/13 0742  CKTOTAL  --   --  227*  --   --   --   TROPONINI 1.50* 0.95*  --  0.89* 0.73* <0.30    BNP: No components found with this basename: POCBNP,   CBG:  Recent Labs Lab 06/30/13 0744 06/30/13 1231 06/30/13 1330 06/30/13 2131 07/01/13 0741  GLUCAP 108* 88 98 162* 116*    Microbiology: Results for orders placed during the hospital encounter of 06/25/13  MRSA PCR SCREENING     Status: None   Collection Time    06/26/13  6:27  AM      Result Value Range Status   MRSA by PCR NEGATIVE  NEGATIVE Final   Comment:            The GeneXpert MRSA Assay (FDA     approved for NASAL specimens     only), is one component of a     comprehensive MRSA colonization     surveillance program. It is not     intended to diagnose MRSA     infection nor to guide or     monitor treatment for     MRSA infections.    Coagulation Studies: No results found for this basename: LABPROT, INR,  in the last 72 hours  Urinalysis: No results found for this basename: COLORURINE, APPERANCEUR, LABSPEC, PHURINE, GLUCOSEU, HGBUR, BILIRUBINUR, KETONESUR, PROTEINUR, UROBILINOGEN, NITRITE, LEUKOCYTESUR,  in the last 72 hours    Imaging: Nm Gastric Emptying  06/30/2013   CLINICAL DATA:  Abdominal pain. Nausea and vomiting. Bloating. Reflux.  EXAM: NUCLEAR MEDICINE GASTRIC EMPTYING SCAN  TECHNIQUE: After oral ingestion of radiolabeled meal, sequential abdominal images were obtained for 120 minutes. Residual percentage of activity remaining within the stomach was calculated at 60 and 120 minutes.  COMPARISON:  None.  RADIOPHARMACEUTICALS:  2 mCi Technetium 99-m labeled sulfur colloid  FINDINGS: Expected location of the stomach in the left upper quadrant. Ingested meal empties the stomach gradually  over the course of the study with 100% retention at 60 min and 80% retention at 120 min (normal retention less than 30% at 120 min).  IMPRESSION: Markedly delayed gastric emptying consistent with gastroparesis.   Electronically Signed   By: Evangeline Dakin M.D.   On: 06/30/2013 18:38     Medications:     . aspirin EC  81 mg Oral Daily  . calcitRIOL  0.5 mcg Oral Daily  . calcium carbonate  1 tablet Oral TID WC  . ferric gluconate (FERRLECIT/NULECIT) IV  125 mg Intravenous Q M,W,F-HD  . heparin  5,000 Units Subcutaneous Q8H  . insulin aspart  0-9 Units Subcutaneous TID WC  . insulin NPH Human  10 Units Subcutaneous BID AC & HS  . labetalol  200 mg Oral BID  . metoCLOPramide (REGLAN) injection  5 mg Intravenous Q6H  . minoxidil  10 mg Oral Daily  . pantoprazole (PROTONIX) IV  40 mg Intravenous Q24H   sodium chloride, sodium chloride, acetaminophen, acetaminophen, alteplase, feeding supplement (NEPRO CARB STEADY), haloperidol lactate, heparin, lidocaine (PF), lidocaine-prilocaine, morphine injection, ondansetron (ZOFRAN) IV, ondansetron, oxyCODONE, pentafluoroprop-tetrafluoroeth  Assessment/ Plan:  1.ESRD-new start to dialysis  [place]- iron studies show iron deficiency , start iron Getting aranesp  3 MBD- phos controlled  HTN/VOL-controlled  ACCESS- out patient appointment Dr Trula Slade     LOS: 6 Nalin Mazzocco W @TODAY @11 :59 AM

## 2013-07-02 LAB — GLUCOSE, CAPILLARY
GLUCOSE-CAPILLARY: 125 mg/dL — AB (ref 70–99)
GLUCOSE-CAPILLARY: 110 mg/dL — AB (ref 70–99)
Glucose-Capillary: 99 mg/dL (ref 70–99)
Glucose-Capillary: 98 mg/dL (ref 70–99)
Glucose-Capillary: 170 mg/dL — ABNORMAL HIGH (ref 70–99)

## 2013-07-02 MED ORDER — PANTOPRAZOLE SODIUM 40 MG PO TBEC
40.0000 mg | DELAYED_RELEASE_TABLET | Freq: Every day | ORAL | Status: DC
  Administered 2013-07-02 – 2013-07-04 (×3): 40 mg via ORAL
  Filled 2013-07-02 (×3): qty 1

## 2013-07-02 MED ORDER — METOCLOPRAMIDE HCL 5 MG PO TABS
5.0000 mg | ORAL_TABLET | Freq: Three times a day (TID) | ORAL | Status: DC
  Administered 2013-07-02 – 2013-07-03 (×4): 5 mg via ORAL
  Filled 2013-07-02 (×6): qty 1

## 2013-07-02 NOTE — Plan of Care (Signed)
Problem: Phase III Progression Outcomes Goal: Voiding independently Outcome: Not Applicable Date Met:  28/07/66 anuric

## 2013-07-02 NOTE — Progress Notes (Addendum)
Progress Note [facility] TEAM 1 - Stepdown/ICU TEAM   [name] [id number] DOB: 11/22/1940 DOA: 06/25/2013 PCP: [name], MD  Admit HPI / Brief Narrative: 73 y.o. Female with past medical history significant for diabetes, hypertension and end-stage renal disease who was recently started on dialysis who presented with the acute onset of convulsions after she finished her first dialysis. No previous history of seizures or convulsions. In the emergency room she received D50 water and juice for hypoglycemia with a blood sugar of 43, and was started on TUMS for hypocalcemia with a calcium of 1.02.  Her troponin was found to be elevated at 1.5 and and EKG showed ST depressions in inferolateral leads.  HPI/Subjective: Doing well with IV Reglan- no further vomiting.   Assessment/Plan:  Hypoglycemia in DM likely due to vomiting prior to admission - resolved - CBG 43 at time of presentation to ER  - not able to tolerate POs at present - cont low dose NPH  Vomiting- gastroparesis - gastric empty scan reveals gastroparesis- Reglan started and has been effective - will switch to PO Reglan today and continue to follow - started Protonix in case this may be related to PUD as well  Confusion/ Delirium  - reaction to Ambien- now on allergy list - also appears to have sundowning  Moderate Hypocalcemia Ionized Ca low at 1.03, but not severely low - corrected Ca was actually ~9 given low albumin of 2.6 - continue with oral replacement - follow levels - Mg and Phos not severely deranged - doubt her Ca++ level had anything to do with her presentation   Jerking movements Unclear etiology - doubt her Ca++ was low enough to be the causing these movements - pt reports they started immediately after her first HD tx - Mg and Phos not severely deranged - BUN does not appear high enough to blame this on uremia - likely simple "metabolic jerks" related to fluid shifting in new start HD pt  - have  resolved at this time   Elevated troponin Peaked at 1.50 - now trending downward - no evidence of STEMI on EKG - f/u EKG this am is w/o acute findings - TTE w/o focal WMA   Newly Diagnosed Grade 2 Diastolic CHF Noted on TTE - volume control will be via HD - BP control will be important   ESRD Started HD on 1/10 ([facility] in [place]) - reports is supposed to be on a M/W/F routine - Nephrology following   Anemia of chronic kidney disease No evidence of acute blood loss - suspect decrease in Hgb due to dilution - follow Hgb trend - Fe/epo per Nephrology   HTN Currently reasonably controlled  Code Status: FULL Family Communication: Spoke with patient and daughter at bedside Disposition Plan: home   Consultants: Nephrology   Procedures: 1/9 - Diatek catheter right internal jugular vein at AP 1/11 - TTE - mild LVH - EF 65-70% - no wall motion abnormalities - grade 2 diastolic dysfunction - moderate tricuspid regurg  Antibiotics: none  DVT prophylaxis: SCDs  Objective: Blood pressure 116/57, pulse 85, temperature 98.5 F (36.9 C), temperature source Oral, resp. rate 18, height 6' (1.829 m), weight 89.3 kg (196 lb 13.9 oz), SpO2 97.00%.  Intake/Output Summary (Last 24 hours) at 07/02/13 1143 Last data filed at 07/01/13 2319  Gross per 24 hour  Intake    460 ml  Output    754 ml  Net   -294 ml   Exam: General: mild  confusion, No acute respiratory distress -  Lungs: Clear to auscultation bilaterally without wheezes or crackles Cardiovascular: Regular rate and rhythm without murmur gallop or rub  Abdomen: Nontender, nondistended, soft, bowel sounds positive, no rebound, no ascites, no appreciable mass Extremities: No significant cyanosis, clubbing, or edema bilateral lower extremities  Data Reviewed: Basic Metabolic Panel:  Recent Labs Lab 06/25/13 1800 06/25/13 1816 06/26/13 0305 06/26/13 1655 06/27/13 0305 06/29/13 0637 06/30/13 0742  NA 137 138 136*  --  132*  135* 134*  K 4.2 4.1 4.4  --  4.8 4.4 4.5  CL 98 102 98  --  94* 96 92*  CO2 23  --  23  --  22 24 24   GLUCOSE 84 77 93  --  193* 86 110*  BUN 40* 38* 41*  --  46* 36* 26*  CREATININE 6.83* 6.90* 7.30*  --  8.25* 7.24* 5.89*  CALCIUM 7.7*  --  7.9*  --  7.5* 8.0* 8.3*  MG  --   --   --  1.9  --   --   --   PHOS  --   --   --  5.1*  --  5.3*  --    Liver Function Tests:  Recent Labs Lab 06/25/13 1800 06/26/13 0305 06/27/13 0305 06/29/13 0637  AST 34 28 20  --   ALT 41* 34 29  --   ALKPHOS 75 66 71  --   BILITOT 0.4 0.4 0.4  --   PROT 7.4 6.2 6.7  --   ALBUMIN 3.0* 2.6* 2.7* 2.5*    Recent Labs Lab 06/26/13 1655  LIPASE 25   CBC:  Recent Labs Lab 06/25/13 1800 06/25/13 1816 06/27/13 0305 06/28/13 0605 06/29/13 0637 06/30/13 0742  WBC 11.2*  --  7.1 10.9* 10.7* 9.8  NEUTROABS 7.7  --   --   --   --   --   HGB 8.9* 9.9* 7.8* 8.0* 7.7* 8.2*  HCT 26.1* 29.0* 22.8* 23.7* 22.7* 24.7*  MCV 85.3  --  85.1 86.2 85.7 87.6  PLT 182  --  168 162 181 199   Cardiac Enzymes:  Recent Labs Lab 06/25/13 1800 06/26/13 0545 06/26/13 1655 06/26/13 1850 06/27/13 0305 06/30/13 0742  CKTOTAL  --   --  227*  --   --   --   TROPONINI 1.50* 0.95*  --  0.89* 0.73* <0.30   CBG:  Recent Labs Lab 07/01/13 0741 07/01/13 1323 07/01/13 1634 07/01/13 2112 07/02/13 0841  GLUCAP 116* 99 130* 194* 98    Recent Results (from the past 240 hour(s))  MRSA PCR SCREENING     Status: None   Collection Time    06/26/13  6:27 AM      Result Value Range Status   MRSA by PCR NEGATIVE  NEGATIVE Final   Comment:            The GeneXpert MRSA Assay (FDA     approved for NASAL specimens     only), is one component of a     comprehensive MRSA colonization     surveillance program. It is not     intended to diagnose MRSA     infection nor to guide or     monitor treatment for     MRSA infections.     Studies:  Recent x-ray studies have been reviewed in detail by the Attending  Physician  Scheduled Meds:  Scheduled Meds: . aspirin EC  81 mg  Oral Daily  . calcitRIOL  0.5 mcg Oral Daily  . calcium carbonate  1 tablet Oral TID WC  . ferric gluconate (FERRLECIT/NULECIT) IV  125 mg Intravenous Q M,W,F-HD  . heparin  5,000 Units Subcutaneous Q8H  . insulin aspart  0-9 Units Subcutaneous TID WC  . insulin NPH Human  10 Units Subcutaneous BID AC & HS  . labetalol  200 mg Oral BID  . metoCLOPramide  5 mg Oral TID AC  . minoxidil  10 mg Oral Daily  . pantoprazole (PROTONIX) IV  40 mg Intravenous Q24H    Time spent on care of this patient: 25 mins   Debbe Odea, MD  [facility] Office  [phone] Pager - Text Page per Shea Evans as per below:  On-Call/Text Page:      [email or web address]      password [email or web address]  If 7PM-7AM, please contact night-coverage [email or web address] Password [email or web address] 07/02/2013, 11:43 AM   LOS: 7 days

## 2013-07-02 NOTE — Progress Notes (Signed)
[name] ROUNDING NOTE   Subjective:   Interval History: tolerated dialysis well yeasterday  Outpatient dialysis [facility]   Objective:  Vital signs in last 24 hours:  Temp:  [98.2 F (36.8 C)-98.7 F (37.1 C)] 98.5 F (36.9 C) (01/17 0418) Pulse Rate:  [77-86] 85 (01/17 0418) Resp:  [18] 18 (01/17 0418) BP: (89-140)/(41-64) 116/57 mmHg (01/17 0900) SpO2:  [95 %-97 %] 97 % (01/17 0418) Weight:  [89.3 kg (196 lb 13.9 oz)-90 kg (198 lb 6.6 oz)] 89.3 kg (196 lb 13.9 oz) (01/16 1450)  Weight change:  Filed Weights   06/29/13 0933 07/01/13 1140 07/01/13 1450  Weight: 91 kg (200 lb 9.9 oz) 90 kg (198 lb 6.6 oz) 89.3 kg (196 lb 13.9 oz)    Intake/Output: I/O last 3 completed shifts: In: 550 [P.O.:330; IV Piggyback:220] Out: 754 [Other:754]   Intake/Output this shift:     CVS- RRR RS- CTA ABD- BS present soft non-distended EXT- no edema   Basic Metabolic Panel:  Recent Labs Lab 06/25/13 1800 06/25/13 1816 06/26/13 0305 06/26/13 1655 06/27/13 0305 06/29/13 0637 06/30/13 0742  NA 137 138 136*  --  132* 135* 134*  K 4.2 4.1 4.4  --  4.8 4.4 4.5  CL 98 102 98  --  94* 96 92*  CO2 23  --  23  --  22 24 24   GLUCOSE 84 77 93  --  193* 86 110*  BUN 40* 38* 41*  --  46* 36* 26*  CREATININE 6.83* 6.90* 7.30*  --  8.25* 7.24* 5.89*  CALCIUM 7.7*  --  7.9*  --  7.5* 8.0* 8.3*  MG  --   --   --  1.9  --   --   --   PHOS  --   --   --  5.1*  --  5.3*  --     Liver Function Tests:  Recent Labs Lab 06/25/13 1800 06/26/13 0305 06/27/13 0305 06/29/13 0637  AST 34 28 20  --   ALT 41* 34 29  --   ALKPHOS 75 66 71  --   BILITOT 0.4 0.4 0.4  --   PROT 7.4 6.2 6.7  --   ALBUMIN 3.0* 2.6* 2.7* 2.5*    Recent Labs Lab 06/26/13 1655  LIPASE 25   No results found for this basename: AMMONIA,  in the last 168 hours  CBC:  Recent Labs Lab 06/25/13 1800 06/25/13 1816 06/27/13 0305 06/28/13 0605 06/29/13 0637 06/30/13 0742  WBC 11.2*   --  7.1 10.9* 10.7* 9.8  NEUTROABS 7.7  --   --   --   --   --   HGB 8.9* 9.9* 7.8* 8.0* 7.7* 8.2*  HCT 26.1* 29.0* 22.8* 23.7* 22.7* 24.7*  MCV 85.3  --  85.1 86.2 85.7 87.6  PLT 182  --  168 162 181 199    Cardiac Enzymes:  Recent Labs Lab 06/25/13 1800 06/26/13 0545 06/26/13 1655 06/26/13 1850 06/27/13 0305 06/30/13 0742  CKTOTAL  --   --  227*  --   --   --   TROPONINI 1.50* 0.95*  --  0.89* 0.73* <0.30    BNP: No components found with this basename: POCBNP,   CBG:  Recent Labs Lab 07/01/13 0741 07/01/13 1323 07/01/13 1634 07/01/13 2112 07/02/13 0841  GLUCAP 116* 99 130* 194* 39    Microbiology: Results for orders placed during the hospital encounter of 06/25/13  MRSA PCR SCREENING  Status: None   Collection Time    06/26/13  6:27 AM      Result Value Range Status   MRSA by PCR NEGATIVE  NEGATIVE Final   Comment:            The GeneXpert MRSA Assay (FDA     approved for NASAL specimens     only), is one component of a     comprehensive MRSA colonization     surveillance program. It is not     intended to diagnose MRSA     infection nor to guide or     monitor treatment for     MRSA infections.    Coagulation Studies: No results found for this basename: LABPROT, INR,  in the last 72 hours  Urinalysis: No results found for this basename: COLORURINE, APPERANCEUR, LABSPEC, PHURINE, GLUCOSEU, HGBUR, BILIRUBINUR, KETONESUR, PROTEINUR, UROBILINOGEN, NITRITE, LEUKOCYTESUR,  in the last 72 hours    Imaging: Nm Gastric Emptying  06/30/2013   CLINICAL DATA:  Abdominal pain. Nausea and vomiting. Bloating. Reflux.  EXAM: NUCLEAR MEDICINE GASTRIC EMPTYING SCAN  TECHNIQUE: After oral ingestion of radiolabeled meal, sequential abdominal images were obtained for 120 minutes. Residual percentage of activity remaining within the stomach was calculated at 60 and 120 minutes.  COMPARISON:  None.  RADIOPHARMACEUTICALS:  2 mCi Technetium 99-m labeled sulfur colloid   FINDINGS: Expected location of the stomach in the left upper quadrant. Ingested meal empties the stomach gradually over the course of the study with 100% retention at 60 min and 80% retention at 120 min (normal retention less than 30% at 120 min).  IMPRESSION: Markedly delayed gastric emptying consistent with gastroparesis.   Electronically Signed   By: Evangeline Dakin M.D.   On: 06/30/2013 18:38     Medications:     . aspirin EC  81 mg Oral Daily  . calcitRIOL  0.5 mcg Oral Daily  . calcium carbonate  1 tablet Oral TID WC  . ferric gluconate (FERRLECIT/NULECIT) IV  125 mg Intravenous Q M,W,F-HD  . heparin  5,000 Units Subcutaneous Q8H  . insulin aspart  0-9 Units Subcutaneous TID WC  . insulin NPH Human  10 Units Subcutaneous BID AC & HS  . labetalol  200 mg Oral BID  . metoCLOPramide (REGLAN) injection  5 mg Intravenous Q6H  . minoxidil  10 mg Oral Daily  . pantoprazole (PROTONIX) IV  40 mg Intravenous Q24H   acetaminophen, acetaminophen, haloperidol lactate, morphine injection, ondansetron (ZOFRAN) IV, ondansetron, oxyCODONE  Assessment/ Plan:  1.ESRD-new start to dialysis [place]- iron studies show iron deficiency , start iron Getting aranesp  3 MBD- phos controlled  HTN/VOL-controlled  ACCESS- out patient appointment Dr Trula Slade       LOS: 7 [name] W @TODAY @11 :09 AM

## 2013-07-03 LAB — GLUCOSE, CAPILLARY
GLUCOSE-CAPILLARY: 144 mg/dL — AB (ref 70–99)
GLUCOSE-CAPILLARY: 124 mg/dL — AB (ref 70–99)
Glucose-Capillary: 181 mg/dL — ABNORMAL HIGH (ref 70–99)
Glucose-Capillary: 151 mg/dL — ABNORMAL HIGH (ref 70–99)

## 2013-07-03 MED ORDER — INSULIN ASPART PROT & ASPART (70-30 MIX) 100 UNIT/ML ~~LOC~~ SUSP: 12.0000 [IU] | Freq: Two times a day (BID) | SUBCUTANEOUS | Status: DC

## 2013-07-03 MED ORDER — METOCLOPRAMIDE HCL 5 MG/ML IJ SOLN
5.0000 mg | INTRAMUSCULAR | Status: AC
  Administered 2013-07-03: 5 mg via INTRAVENOUS
  Filled 2013-07-03: qty 1

## 2013-07-03 MED ORDER — METOCLOPRAMIDE HCL 5 MG PO TABS: 5.0000 mg | ORAL_TABLET | Freq: Three times a day (TID) | ORAL | Status: DC

## 2013-07-03 MED ORDER — PANTOPRAZOLE SODIUM 40 MG PO TBEC: 40.0000 mg | DELAYED_RELEASE_TABLET | Freq: Every day | ORAL | Status: DC

## 2013-07-03 MED ORDER — ASPIRIN 81 MG PO TBEC: 81.0000 mg | DELAYED_RELEASE_TABLET | Freq: Every day | ORAL | Status: DC

## 2013-07-03 MED ORDER — METOCLOPRAMIDE HCL 5 MG PO TABS: 5.0000 mg | ORAL_TABLET | Freq: Once | ORAL | Status: DC

## 2013-07-03 MED ORDER — METOCLOPRAMIDE HCL 10 MG PO TABS
10.0000 mg | ORAL_TABLET | Freq: Three times a day (TID) | ORAL | Status: DC
  Administered 2013-07-03 – 2013-07-04 (×3): 10 mg via ORAL
  Filled 2013-07-03 (×3): qty 1

## 2013-07-03 NOTE — Progress Notes (Signed)
Patient's BP was 96/61. Advised patient to increase her oral intake. Will continue to monitor.

## 2013-07-03 NOTE — Discharge Summary (Addendum)
Physician Discharge Summary  [name] [id number] DOB: 1941-03-25 DOA: 06/25/2013  PCP: [name], MD  Admit date: 06/25/2013 Discharge date: 07/03/2013  Time spent: >45 minutes   Discharge Diagnoses:  Principal Problem:   Hypoglycemia Active Problems:   End stage renal disease   Hypocalcemia   Non-ST elevation MI (NSTEMI)   Nausea with vomiting   Gastroparesis due to DM   Discharge Condition: stable  Diet recommendation: diabetic, renal, heart healthy  Filed Weights   06/29/13 0933 07/01/13 1140 07/01/13 1450  Weight: 91 kg (200 lb 9.9 oz) 90 kg (198 lb 6.6 oz) 89.3 kg (196 lb 13.9 oz)    History of present illness:  73 y.o. Female with past medical history significant for diabetes, hypertension and end-stage renal disease who was recently started on dialysis who presented with the acute onset of convulsions after she finished her first dialysis. No previous history of seizures or convulsions. In the emergency room she received D50 water and juice for hypoglycemia with a blood sugar of 43, and was started on TUMS for hypocalcemia with a calcium of 1.02. Her troponin was found to be elevated at 1.5 and and EKG showed ST depressions in inferolateral leads.   Hospital Course:   Hypoglycemia in DM  likely due to vomiting prior to admission - resolved - CBG 43 at time of presentation to ER - - Pt has been on Novolog and NPH- dosage required adjustment due to poor PO intake during the hospital stay - at this point will discharge her with a lower dose of Novolog 70/30 with recommended close follow up with PCP   Intractable Vomiting- gastroparesis  - gastric empty scan reveals significant gastroparesis - Reglan started and has been effective  - started Protonix in case this may be related to PUD as well - can continue at discharge for next few wks  Elevated troponin  Peaked at 1.50 - has trended downward to normal - no evidence of STEMI on EKG - f/u EKG this am is  w/o acute findings - TTE w/o focal WMA   Confusion/ Delirium  - reaction to Ambien- now on allergy list  - also appears to have sundowning   Moderate Hypocalcemia  Ionized Ca low at 1.03, but not severely low - corrected Ca was actually ~9 given low albumin of 2.6 - continue with oral replacement - follow levels - Mg and Phos not severely deranged - doubt her Ca++ level had anything to do with her presentation   Jerking movements  Unclear etiology - doubt her Ca++ was low enough to be the causing these movements - pt reports they started immediately after her first HD tx - Mg and Phos not severely deranged - BUN does not appear high enough to blame this on uremia - likely simple "metabolic jerks" related to fluid shifting in new start HD pt - have resolved at this time   Newly Diagnosed Grade 2 Diastolic CHF  Noted on TTE - volume control will be via HD - BP control will be important   ESRD  Started HD on 1/10 ([facility] in [place]) - reports is supposed to be on a M/W/F routine - Nephrology has been following during the hospital stay   Anemia of chronic kidney disease  No evidence of acute blood loss - suspect decrease in Hgb due to dilution - follow Hgb trend - Fe/epo per Nephrology   HTN  Currently reasonably controlled    Consultations:  nephrology  Discharge Exam: Filed  Vitals:   07/03/13 1015  BP: 117/54  Pulse: 89  Temp:   Resp:     General: AAO x 3 Cardiovascular: RRR, no murmurs Respiratory: CTA b/l   Discharge Instructions      Discharge Orders   Future Orders Complete By Expires   Diet - low sodium heart healthy  As directed    Scheduling Instructions:     Renal and diabetic diet   Comments:     Renal and diabetic diet   Increase activity slowly  As directed        Medication List    STOP taking these medications       furosemide 40 MG tablet  Commonly known as:  LASIX     ibuprofen 200 MG tablet  Commonly known as:  ADVIL,MOTRIN       TAKE these medications       aspirin 81 MG EC tablet  Take 1 tablet (81 mg total) by mouth daily.     cholecalciferol 1000 UNITS tablet  Commonly known as:  VITAMIN D  Take 1,000 Units by mouth daily.     insulin aspart protamine- aspart (70-30) 100 UNIT/ML injection  Commonly known as:  NOVOLOG MIX 70/30  Inject 0.12-0.16 mLs (12-16 Units total) into the skin 2 (two) times daily with a meal. Patient takes 60 units in the morning and 44 units in the evening     labetalol 200 MG tablet  Commonly known as:  NORMODYNE  Take 200 mg by mouth 2 (two) times daily.     metoCLOPramide 5 MG tablet  Commonly known as:  REGLAN  Take 1 tablet (5 mg total) by mouth 3 (three) times daily before meals.     minoxidil 10 MG tablet  Commonly known as:  LONITEN  Take 10 mg by mouth daily.     pantoprazole 40 MG tablet  Commonly known as:  PROTONIX  Take 1 tablet (40 mg total) by mouth daily with supper.     VITAMIN B-12 IJ  Inject 1 application as directed every 30 (thirty) days.       No Known Allergies    The results of significant diagnostics from this hospitalization (including imaging, microbiology, ancillary and laboratory) are listed below for reference.    Significant Diagnostic Studies: Dg Chest 2 View  06/24/2013   CLINICAL DATA:  Hypertension, preop dialysis catheter placement.  EXAM: CHEST  2 VIEW  COMPARISON:  August 16, 2009  FINDINGS: The heart size and mediastinal contours are within normal limits. The aorta is slightly uncoiled. Both lungs are clear. There is no focal infiltrate, pulmonary edema, or pleural effusions. The visualized skeletal structures are stable.  IMPRESSION: No active cardiopulmonary disease.   Electronically Signed   By: Abelardo Diesel M.D.   On: 06/24/2013 07:30   Ct Head Wo Contrast  06/25/2013   CLINICAL DATA:  73 year old female with generalized weakness and persistent shaking.  EXAM: CT HEAD WITHOUT CONTRAST  TECHNIQUE: Contiguous axial images were  obtained from the base of the skull through the vertex without intravenous contrast.  COMPARISON:  08/16/2009 head CT  FINDINGS: No acute intracranial abnormalities are identified, including mass lesion or mass effect, hydrocephalus, extra-axial fluid collection, midline shift, hemorrhage, or acute infarction.  Bilateral basal ganglia calcifications are again noted.  The visualized bony calvarium is unremarkable.  IMPRESSION: No evidence of acute intracranial abnormality.   Electronically Signed   By: Hassan Rowan M.D.   On: 06/25/2013 19:18   Nm  Gastric Emptying  06/30/2013   CLINICAL DATA:  Abdominal pain. Nausea and vomiting. Bloating. Reflux.  EXAM: NUCLEAR MEDICINE GASTRIC EMPTYING SCAN  TECHNIQUE: After oral ingestion of radiolabeled meal, sequential abdominal images were obtained for 120 minutes. Residual percentage of activity remaining within the stomach was calculated at 60 and 120 minutes.  COMPARISON:  None.  RADIOPHARMACEUTICALS:  2 mCi Technetium 99-m labeled sulfur colloid  FINDINGS: Expected location of the stomach in the left upper quadrant. Ingested meal empties the stomach gradually over the course of the study with 100% retention at 60 min and 80% retention at 120 min (normal retention less than 30% at 120 min).  IMPRESSION: Markedly delayed gastric emptying consistent with gastroparesis.   Electronically Signed   By: Evangeline Dakin M.D.   On: 06/30/2013 18:38   Dg Chest Port 1 View  06/24/2013   CLINICAL DATA:  Postoperative study, rule out pneumothorax  EXAM: PORTABLE CHEST - 1 VIEW  COMPARISON:  PA and lateral chest x-ray of earlier today  FINDINGS: The patient has undergone interval placement of a large caliber dual-lumen dialysis type catheter via the right internal jugular approach. The tip of the catheter lies in the region of the junction of the SVC with the right atrium. There is no evidence of a postprocedure pneumothorax or hemothorax. Both lungs are clear. The cardiopericardial  silhouette is mildly enlarged. The pulmonary vascularity is not engorged.  IMPRESSION: There is no evidence of a postprocedure complication following placement of the dialysis type catheter via the right internal jugular approach.   Electronically Signed   By: David  Martinique   On: 06/24/2013 09:16    Microbiology: Recent Results (from the past 240 hour(s))  MRSA PCR SCREENING     Status: None   Collection Time    06/26/13  6:27 AM      Result Value Range Status   MRSA by PCR NEGATIVE  NEGATIVE Final   Comment:            The GeneXpert MRSA Assay (FDA     approved for NASAL specimens     only), is one component of a     comprehensive MRSA colonization     surveillance program. It is not     intended to diagnose MRSA     infection nor to guide or     monitor treatment for     MRSA infections.     Labs: Basic Metabolic Panel:  Recent Labs Lab 06/26/13 1655 06/27/13 0305 06/29/13 0637 06/30/13 0742  NA  --  132* 135* 134*  K  --  4.8 4.4 4.5  CL  --  94* 96 92*  CO2  --  22 24 24   GLUCOSE  --  193* 86 110*  BUN  --  46* 36* 26*  CREATININE  --  8.25* 7.24* 5.89*  CALCIUM  --  7.5* 8.0* 8.3*  MG 1.9  --   --   --   PHOS 5.1*  --  5.3*  --    Liver Function Tests:  Recent Labs Lab 06/27/13 0305 06/29/13 0637  AST 20  --   ALT 29  --   ALKPHOS 71  --   BILITOT 0.4  --   PROT 6.7  --   ALBUMIN 2.7* 2.5*    Recent Labs Lab 06/26/13 1655  LIPASE 25   No results found for this basename: AMMONIA,  in the last 168 hours CBC:  Recent Labs Lab 06/27/13 0305 06/28/13  [id number] 06/29/13 0637 06/30/13 0742  WBC 7.1 10.9* 10.7* 9.8  HGB 7.8* 8.0* 7.7* 8.2*  HCT 22.8* 23.7* 22.7* 24.7*  MCV 85.1 86.2 85.7 87.6  PLT 168 162 181 199   Cardiac Enzymes:  Recent Labs Lab 06/26/13 1655 06/26/13 1850 06/27/13 0305 06/30/13 0742  CKTOTAL 227*  --   --   --   TROPONINI  --  0.89* 0.73* <0.30   BNP: BNP (last 3 results) No results found for this basename: PROBNP,   in the last 8760 hours CBG:  Recent Labs Lab 07/02/13 0841 07/02/13 1242 07/02/13 1656 07/02/13 2110 07/03/13 0801  GLUCAP 98 125* 110* 170* 144*       SignedDebbe Odea, MD  [facility] 07/03/2013, 10:27 AM

## 2013-07-03 NOTE — Progress Notes (Signed)
[facility] ROUNDING NOTE   Subjective:   Interval History: tolerated dialysis well yeasterday Outpatient dialysis [facility]    Objective:  Vital signs in last 24 hours:  Temp:  [97.9 F (36.6 C)-98.8 F (37.1 C)] 98.6 F (37 C) (01/18 0508) Pulse Rate:  [75-97] 89 (01/18 1015) Resp:  [18] 18 (01/18 0508) BP: (94-131)/(48-67) 117/54 mmHg (01/18 1015) SpO2:  [95 %-96 %] 96 % (01/18 0508)  Weight change:  Filed Weights   06/29/13 0933 07/01/13 1140 07/01/13 1450  Weight: 91 kg (200 lb 9.9 oz) 90 kg (198 lb 6.6 oz) 89.3 kg (196 lb 13.9 oz)    Intake/Output: I/O last 3 completed shifts: In: [id number] [P.O.:642] Out: -    Intake/Output this shift:     CVS- RRR RS- CTA ABD- BS present soft non-distended EXT- no edema   Basic Metabolic Panel:  Recent Labs Lab 06/26/13 1655 06/27/13 0305 06/29/13 0637 06/30/13 0742  NA  --  132* 135* 134*  K  --  4.8 4.4 4.5  CL  --  94* 96 92*  CO2  --  22 24 24   GLUCOSE  --  193* 86 110*  BUN  --  46* 36* 26*  CREATININE  --  8.25* 7.24* 5.89*  CALCIUM  --  7.5* 8.0* 8.3*  MG 1.9  --   --   --   PHOS 5.1*  --  5.3*  --     Liver Function Tests:  Recent Labs Lab 06/27/13 0305 06/29/13 0637  AST 20  --   ALT 29  --   ALKPHOS 71  --   BILITOT 0.4  --   PROT 6.7  --   ALBUMIN 2.7* 2.5*    Recent Labs Lab 06/26/13 1655  LIPASE 25   No results found for this basename: AMMONIA,  in the last 168 hours  CBC:  Recent Labs Lab 06/27/13 0305 06/28/13 0605 06/29/13 0637 06/30/13 0742  WBC 7.1 10.9* 10.7* 9.8  HGB 7.8* 8.0* 7.7* 8.2*  HCT 22.8* 23.7* 22.7* 24.7*  MCV 85.1 86.2 85.7 87.6  PLT 168 162 181 199    Cardiac Enzymes:  Recent Labs Lab 06/26/13 1655 06/26/13 1850 06/27/13 0305 06/30/13 0742  CKTOTAL 227*  --   --   --   TROPONINI  --  0.89* 0.73* <0.30    BNP: No components found with this basename: POCBNP,   CBG:  Recent Labs Lab 07/02/13 0841 07/02/13 1242  07/02/13 1656 07/02/13 2110 07/03/13 0801  GLUCAP 98 125* 110* 170* 144*    Microbiology: Results for orders placed during the hospital encounter of 06/25/13  MRSA PCR SCREENING     Status: None   Collection Time    06/26/13  6:27 AM      Result Value Range Status   MRSA by PCR NEGATIVE  NEGATIVE Final   Comment:            The GeneXpert MRSA Assay (FDA     approved for NASAL specimens     only), is one component of a     comprehensive MRSA colonization     surveillance program. It is not     intended to diagnose MRSA     infection nor to guide or     monitor treatment for     MRSA infections.    Coagulation Studies: No results found for this basename: LABPROT, INR,  in the last 72 hours  Urinalysis: No results found for  this basename: COLORURINE, APPERANCEUR, LABSPEC, Black, GLUCOSEU, HGBUR, BILIRUBINUR, KETONESUR, PROTEINUR, UROBILINOGEN, NITRITE, LEUKOCYTESUR,  in the last 72 hours    Imaging: No results found.   Medications:     . aspirin EC  81 mg Oral Daily  . calcitRIOL  0.5 mcg Oral Daily  . calcium carbonate  1 tablet Oral TID WC  . ferric gluconate (FERRLECIT/NULECIT) IV  125 mg Intravenous Q M,W,F-HD  . heparin  5,000 Units Subcutaneous Q8H  . insulin aspart  0-9 Units Subcutaneous TID WC  . insulin NPH Human  10 Units Subcutaneous BID AC & HS  . labetalol  200 mg Oral BID  . metoCLOPramide  5 mg Oral TID AC  . minoxidil  10 mg Oral Daily  . pantoprazole  40 mg Oral Q supper   acetaminophen, acetaminophen, haloperidol lactate, morphine injection, ondansetron (ZOFRAN) IV, ondansetron, oxyCODONE  Assessment/ Plan:  73 y.o. Female with past medical history significant for diabetes, hypertension and end-stage renal disease who was recently started on dialysis who presented with the acute onset of convulsions after she finished her first dialysis.since hospitalization, she has been bothered by vomiting though to be gastroparesis with a markedly abnormal  gastric emptying study  1.ESRD-new start to dialysis [place]- iron studies show iron deficiency , start iron Getting aranesp  3 MBD- phos controlled  HTN/VOL-controlled  ACCESS- out patient appointment Dr Trula Slade     LOS: 8 [name] W @TODAY @11 :52 AM

## 2013-07-04 LAB — RENAL FUNCTION PANEL
Albumin: 3 g/dL — ABNORMAL LOW (ref 3.5–5.2)
BUN: 25 mg/dL — ABNORMAL HIGH (ref 6–23)
CHLORIDE: 94 meq/L — AB (ref 96–112)
CO2: 25 meq/L (ref 19–32)
CREATININE: 8.37 mg/dL — AB (ref 0.50–1.10)
Calcium: 8.4 mg/dL (ref 8.4–10.5)
GFR calc Af Amer: 5 mL/min — ABNORMAL LOW (ref >90)
GFR calc non Af Amer: 4 mL/min — ABNORMAL LOW (ref >90)
GLUCOSE: 99 mg/dL (ref 70–99)
POTASSIUM: 4 meq/L (ref 3.7–5.3)
Phosphorus: 4.6 mg/dL (ref 2.3–4.6)
Sodium: 136 mEq/L — ABNORMAL LOW (ref 137–147)

## 2013-07-04 LAB — GLUCOSE, CAPILLARY
GLUCOSE-CAPILLARY: 95 mg/dL (ref 70–99)
GLUCOSE-CAPILLARY: 107 mg/dL — AB (ref 70–99)
Glucose-Capillary: 147 mg/dL — ABNORMAL HIGH (ref 70–99)
Glucose-Capillary: 15 mg/dL — CL (ref 70–99)

## 2013-07-04 LAB — CBC
HEMATOCRIT: 24.5 % — AB (ref 36.0–46.0)
HEMOGLOBIN: 8.2 g/dL — AB (ref 12.0–15.0)
MCH: 29.2 pg (ref 26.0–34.0)
MCHC: 33.5 g/dL (ref 30.0–36.0)
MCV: 87.2 fL (ref 78.0–100.0)
Platelets: 229 10*3/uL (ref 150–400)
RBC: 2.81 MIL/uL — ABNORMAL LOW (ref 3.87–5.11)
RDW: 14 % (ref 11.5–15.5)
WBC: 9.8 10*3/uL (ref 4.0–10.5)

## 2013-07-04 MED ORDER — RENA-VITE PO TABS: 1.0000 | ORAL_TABLET | Freq: Every day | ORAL | Status: DC

## 2013-07-04 MED ORDER — METOCLOPRAMIDE HCL 10 MG PO TABS: 10.0000 mg | ORAL_TABLET | Freq: Three times a day (TID) | ORAL | Status: DC

## 2013-07-04 MED ORDER — SODIUM CHLORIDE 0.9 % IV SOLN: 125.0000 mg | INTRAVENOUS | Status: DC

## 2013-07-04 MED ORDER — PENTAFLUOROPROP-TETRAFLUOROETH EX AERO: 1.0000 "application " | INHALATION_SPRAY | CUTANEOUS | Status: DC | PRN

## 2013-07-04 MED ORDER — PANTOPRAZOLE SODIUM 40 MG PO TBEC: 40.0000 mg | DELAYED_RELEASE_TABLET | Freq: Every day | ORAL | Status: DC

## 2013-07-04 MED ORDER — SODIUM CHLORIDE 0.9 % IV SOLN: 100.0000 mL | INTRAVENOUS | Status: DC | PRN

## 2013-07-04 MED ORDER — LIDOCAINE-PRILOCAINE 2.5-2.5 % EX CREA: 1.0000 "application " | TOPICAL_CREAM | CUTANEOUS | Status: DC | PRN

## 2013-07-04 MED ORDER — INSULIN NPH (HUMAN) (ISOPHANE) 100 UNIT/ML ~~LOC~~ SUSP: 10.0000 [IU] | Freq: Two times a day (BID) | SUBCUTANEOUS | Status: DC

## 2013-07-04 MED ORDER — DEXTROSE 50 % IV SOLN
INTRAVENOUS | Status: AC
  Administered 2013-07-04: 50 mL
  Filled 2013-07-04: qty 50

## 2013-07-04 MED ORDER — ALTEPLASE 2 MG IJ SOLR: 2.0000 mg | Freq: Once | INTRAMUSCULAR | Status: DC | PRN

## 2013-07-04 MED ORDER — MINOXIDIL 2.5 MG PO TABS
5.0000 mg | ORAL_TABLET | Freq: Two times a day (BID) | ORAL | Status: DC
  Administered 2013-07-04: 5 mg via ORAL
  Filled 2013-07-04 (×2): qty 2

## 2013-07-04 MED ORDER — MINOXIDIL 2.5 MG PO TABS: 5.0000 mg | ORAL_TABLET | Freq: Two times a day (BID) | ORAL | Status: DC

## 2013-07-04 MED ORDER — RENA-VITE PO TABS
1.0000 | ORAL_TABLET | Freq: Every day | ORAL | Status: DC
  Filled 2013-07-04: qty 1

## 2013-07-04 MED ORDER — LIDOCAINE HCL (PF) 1 % IJ SOLN: 5.0000 mL | INTRAMUSCULAR | Status: DC | PRN

## 2013-07-04 MED ORDER — DEXTROSE 50 % IV SOLN: 50.0000 mL | Freq: Once | INTRAVENOUS | Status: AC | PRN

## 2013-07-04 MED ORDER — CALCIUM CARBONATE ANTACID 500 MG PO CHEW: 1.0000 | CHEWABLE_TABLET | Freq: Three times a day (TID) | ORAL | Status: DC

## 2013-07-04 MED ORDER — NEPRO/CARBSTEADY PO LIQD: 237.0000 mL | ORAL | Status: DC | PRN

## 2013-07-04 MED ORDER — HEPARIN SODIUM (PORCINE) 1000 UNIT/ML DIALYSIS
1000.0000 [IU] | INTRAMUSCULAR | Status: DC | PRN
Start: 2013-07-04 — End: 2013-07-04

## 2013-07-04 MED ORDER — METOCLOPRAMIDE HCL 5 MG PO TABS: 5.0000 mg | ORAL_TABLET | Freq: Three times a day (TID) | ORAL | Status: DC

## 2013-07-04 NOTE — Progress Notes (Signed)
Pt to discharge home with family.  IV removed; site clean, dry, and intact.  Catheter tip intact.  Tele monitor removed.  Discharge instructions given to patient and family; verbalized understanding.  No complaints of pain, and no other signs or symptoms noted.

## 2013-07-04 NOTE — Progress Notes (Signed)
Inpatient Diabetes Program Recommendations  AACE/ADA: New Consensus Statement on Inpatient Glycemic Control (2013)  Target Ranges:  Prepandial:   less than 140 mg/dL      Peak postprandial:   less than 180 mg/dL (1-2 hours)      Critically ill patients:  140 - 180 mg/dL   Results for [name], [name] (MRN [id number]) as of 07/04/2013 11:09  Ref. Range 07/03/2013 08:01 07/03/2013 12:04 07/03/2013 17:36 07/03/2013 21:22 07/04/2013 04:14 07/04/2013 04:28 07/04/2013 08:11  Glucose-Capillary Latest Range: 70-99 mg/dL 144 (H) 181 (H) 124 (H) 151 (H) 15 (LL) 147 (H) 95   Inpatient Diabetes Program Recommendations Insulin - Basal: May want to consider decreasing evening dose of NPH. Outpatient Referral: MD- At time of discharge, please re-evaluate home dose of 70/30 since patient is not requiring as much insulin as an inpatient and concerned about risk of hypoglycemia with high outpatient 70/30 doses.  Thanks, Barnie Alderman, RN, MSN, CCRN Diabetes Coordinator Inpatient Diabetes Program [phone] (Team Pager) [phone] ([facility] office) [phone] [facility] office)

## 2013-07-04 NOTE — Progress Notes (Signed)
CRITICAL VALUE ALERT  Critical value received: 15   Date of notification:  07/04/13  Time of notification:  0420  Critical value read back:yes  Nurse who received alert:  Eden Lathe  MD notified (1st page):  R. Reidler  Time of first page: [phone]  MD notified (2nd page):  Time of second page:  Responding MD:  Alfonso Patten Reidler  Time MD responded:  [id number]  No new orders given. Will continue to monitor pt and pass on to day shift team that pt became hypoglycemic overnight

## 2013-07-04 NOTE — Discharge Summary (Addendum)
Physician Discharge Summary  [name] [id number] DOB: 05/01/1941 DOA: 06/25/2013  PCP: [name], MD  Admit date: 06/25/2013 Discharge date: 07/04/2013  Time spent: >35 minutes  Discharge Diagnoses:    Hypoglycemia   End stage renal disease   Hypocalcemia   Non-ST elevation MI (NSTEMI)   Nausea with vomiting   Gastroparesis due to DM  Discharge Condition: stable  Diet recommendation: diabetic, renal, heart healthy  Filed Weights   07/01/13 1450 07/04/13 0844 07/04/13 1244  Weight: 89.3 kg (196 lb 13.9 oz) 89.8 kg (197 lb 15.6 oz) 86.4 kg (190 lb 7.6 oz)    History of present illness:  73 y.o. female with past medical history significant for diabetes, hypertension and end-stage renal disease who was recently started on dialysis who presented with the acute onset of convulsions after she finished her first dialysis. No previous history of seizures or convulsions. In the emergency room she received D50 and juice for hypoglycemia with a blood sugar of 43, and was started on TUMS for mild hypocalcemia with a calcium of 1.02. Her troponin was found to be elevated at 1.5 and and EKG showed ST depressions in inferolateral leads.  Hospital Course:   Hypoglycemia in DM  - likely due to vomiting prior to admission - resolved - CBG 43 at time of presentation to ER  - Pt has been on Novolog and NPH - dosage required adjustment due to poor PO intake during the hospital stay - at this point will discharge her with a lower dose of Novolog 70/30 with recommended close follow up with PCP  Intractable Vomiting - gastroparesis  - gastric empty scan revealed significant gastroparesis - Reglan started and has been effective  - started Protonix in case this may be related to PUD as well - continue at discharge for 4 weeks then determine if ongoing use needed  Elevated troponin  Peaked at 1.50 - trended downward to normal - no evidence of STEMI on EKG - f/u EKG was w/o acute findings  - TTE w/o focal WMA   Confusion/ Delirium  - reaction to Ambien - now on allergy list  - also appears to have sundowning  - resolved at time of d/c   Moderate Hypocalcemia  Ionized Ca low at 1.03, but not severely low - corrected Ca was actually ~9 given low albumin of 2.6 - continue with oral replacement - follow levels - Mg and Phos not severely deranged - doubt her Ca++ level had anything to do with her presentation   Jerking movements  Unclear etiology - doubt her Ca++ was low enough to be the causing these movements - pt reported they started immediately after her first HD tx - Mg and Phos not severely deranged - BUN did not appear high enough to blame this on uremia - likely simple "metabolic jerks" related to fluid shifting in new start HD pt - have resolved   Newly Diagnosed Grade 2 Diastolic CHF  Noted on TTE - volume control will be via HD - BP control will be important   ESRD  Started HD on 1/10 ([facility] in [place]) - reports is supposed to be on a M/W/F routine - Nephrology has been following during the hospital stay   Anemia of chronic kidney disease  No evidence of acute blood loss - suspect decrease in Hgb due to dilution - follow Hgb trend - Fe/epo per Nephrology   HTN  Currently reasonably controlled    Consultations:  Nephrology  Discharge Exam:  Filed Vitals:   07/04/13 1420  BP: 122/47  Pulse: 66  Temp:   Resp:     General: No acute respiratory distress - alert and oriented  Lungs: Clear to auscultation bilaterally without wheezes or crackles Cardiovascular: Regular rate and rhythm without murmur gallop or rub  Abdomen: Nontender, nondistended, soft, bowel sounds positive, no rebound, no ascites, no appreciable mass Extremities: No significant cyanosis, clubbing, or edema bilateral lower extremities   Discharge Instructions      Discharge Orders   Future Orders Complete By Expires   Diet - low sodium heart healthy  As directed    Scheduling  Instructions:     Renal and diabetic diet   Comments:     Renal and diabetic diet   Increase activity slowly  As directed    Increase activity slowly  As directed        Medication List    STOP taking these medications       furosemide 40 MG tablet  Commonly known as:  LASIX     ibuprofen 200 MG tablet  Commonly known as:  ADVIL,MOTRIN     insulin aspart protamine- aspart (70-30) 100 UNIT/ML injection  Commonly known as:  NOVOLOG MIX 70/30      TAKE these medications       aspirin 81 MG EC tablet  Take 1 tablet (81 mg total) by mouth daily.     calcium carbonate 500 MG chewable tablet  Commonly known as:  TUMS - dosed in mg elemental calcium  Chew 1 tablet (200 mg of elemental calcium total) by mouth 3 (three) times daily with meals.     cholecalciferol 1000 UNITS tablet  Commonly known as:  VITAMIN D  Take 1,000 Units by mouth daily.     insulin NPH Human 100 UNIT/ML injection  Commonly known as:  HUMULIN N,NOVOLIN N  Inject 10 Units into the skin 2 (two) times daily at 8 am and 10 pm.     labetalol 200 MG tablet  Commonly known as:  NORMODYNE  Take 200 mg by mouth 2 (two) times daily.     metoCLOPramide 10 MG tablet  Commonly known as:  REGLAN  Take 1 tablet (10 mg total) by mouth 3 (three) times daily before meals.     minoxidil 2.5 MG tablet  Commonly known as:  LONITEN  Take 2 tablets (5 mg total) by mouth 2 (two) times daily.     multivitamin Tabs tablet  Take 1 tablet by mouth at bedtime.     pantoprazole 40 MG tablet  Commonly known as:  PROTONIX  Take 1 tablet (40 mg total) by mouth daily with supper.     sodium chloride 0.9 % SOLN 100 mL with ferric gluconate 12.5 MG/ML SOLN 125 mg  Inject 125 mg into the vein every Monday, Wednesday, and Friday with hemodialysis.     VITAMIN B-12 IJ  Inject 1 application as directed every 30 (thirty) days.       No Known Allergies Follow-up Information   Follow up with HILL,[name], MD. Schedule an  appointment as soon as possible for a visit in 5 days.   Specialty:  Family Medicine   Contact information:   [address] [place] [address] [phone]      Microbiology: Recent Results (from the past 240 hour(s))  MRSA PCR SCREENING     Status: None   Collection Time    06/26/13  6:27 AM  Result Value Range Status   MRSA by PCR NEGATIVE  NEGATIVE Final   Comment:            The GeneXpert MRSA Assay (FDA     approved for NASAL specimens     only), is one component of a     comprehensive MRSA colonization     surveillance program. It is not     intended to diagnose MRSA     infection nor to guide or     monitor treatment for     MRSA infections.     Labs: Basic Metabolic Panel:  Recent Labs Lab 06/29/13 0637 06/30/13 0742 07/04/13 0903  NA 135* 134* 136*  K 4.4 4.5 4.0  CL 96 92* 94*  CO2 24 24 25   GLUCOSE 86 110* 99  BUN 36* 26* 25*  CREATININE 7.24* 5.89* 8.37*  CALCIUM 8.0* 8.3* 8.4  PHOS 5.3*  --  4.6   Liver Function Tests:  Recent Labs Lab 06/29/13 0637 07/04/13 0903  ALBUMIN 2.5* 3.0*   CBC:  Recent Labs Lab 06/28/13 0605 06/29/13 0637 06/30/13 0742 07/04/13 0903  WBC 10.9* 10.7* 9.8 9.8  HGB 8.0* 7.7* 8.2* 8.2*  HCT 23.7* 22.7* 24.7* 24.5*  MCV 86.2 85.7 87.6 87.2  PLT 162 181 199 229   Cardiac Enzymes:  Recent Labs Lab 06/30/13 0742  TROPONINI <0.30   CBG:  Recent Labs Lab 07/03/13 2122 07/04/13 0414 07/04/13 0428 07/04/13 0811 07/04/13 1354  GLUCAP 151* 15* 147* 95 107*    Signed:  Serenitee Fuertes T, MD  [facility] 07/04/2013, 4:51 PM

## 2013-07-04 NOTE — Procedures (Signed)
I was present at this session.  I have reviewed the session itself and made appropriate changes.  Bp & vol ok. olc green. Cath function ok.  Collen Vincent L 1/19/201512:38 PM

## 2013-07-04 NOTE — Progress Notes (Signed)
Subjective: Interval History: has no complaints, but wants to get home.  Objective: Vital signs in last 24 hours: Temp:  [98.4 F (36.9 C)-98.9 F (37.2 C)] 98.9 F (37.2 C) (01/18 2126) Pulse Rate:  [88-90] 90 (01/19 0441) Resp:  [18-20] 20 (01/19 0441) BP: (104-156)/(51-75) 156/75 mmHg (01/19 0441) SpO2:  [96 %-98 %] 98 % (01/19 0441) Weight change:   Intake/Output from previous day: 01/18 0701 - 01/19 0700 In: 220 [P.O.:220] Out: -  Intake/Output this shift:    General appearance: alert, cooperative and no distress Resp: diminished breath sounds bilaterally Chest wall: RIJ cath Cardio: S1, S2 normal and systolic murmur: systolic ejection 2/6, decrescendo at 2nd left intercostal space GI: pos bs, soft, liver down 4 cm  Lab Results: No results found for this basename: WBC, HGB, HCT, PLT,  in the last 72 hours BMET: No results found for this basename: NA, K, CL, CO2, GLUCOSE, BUN, CREATININE, CALCIUM,  in the last 72 hours No results found for this basename: PTH,  in the last 72 hours Iron Studies: No results found for this basename: IRON, TIBC, TRANSFERRIN, FERRITIN,  in the last 72 hours  Studies/Results: No results found.  I have reviewed the patient's current medications.  Assessment/Plan: 1 ESRD for HD 2 HTN lower vol and meds and more approp dosing for t 1/2.   3 Anemia on epo, fe 4 HPTH 5 gastroparesis stabel 6 DM controlled  P HD,epo , change meds, vit    LOS: 9 days   Arelys Glassco L 07/04/2013,7:33 AM

## 2013-07-08 ENCOUNTER — Other Ambulatory Visit: Payer: Self-pay | Admitting: *Deleted

## 2013-07-08 ENCOUNTER — Encounter: Payer: Self-pay | Admitting: Vascular Surgery

## 2013-07-08 DIAGNOSIS — N186 End stage renal disease: Secondary | ICD-10-CM

## 2013-07-11 ENCOUNTER — Encounter (HOSPITAL_COMMUNITY): Payer: Self-pay | Admitting: Emergency Medicine

## 2013-07-11 ENCOUNTER — Emergency Department (HOSPITAL_COMMUNITY)
Admission: EM | Admit: 2013-07-11 | Discharge: 2013-07-11 | Disposition: A | Discharge status: Home / Self Care | Payer: Medicare Other | Attending: Emergency Medicine | Admitting: Emergency Medicine

## 2013-07-11 DIAGNOSIS — Z862 Personal history of diseases of the blood and blood-forming organs and certain disorders involving the immune mechanism: Secondary | ICD-10-CM | POA: Insufficient documentation

## 2013-07-11 DIAGNOSIS — E162 Hypoglycemia, unspecified: Secondary | ICD-10-CM

## 2013-07-11 DIAGNOSIS — Z992 Dependence on renal dialysis: Secondary | ICD-10-CM | POA: Insufficient documentation

## 2013-07-11 DIAGNOSIS — E1169 Type 2 diabetes mellitus with other specified complication: Secondary | ICD-10-CM | POA: Insufficient documentation

## 2013-07-11 DIAGNOSIS — N186 End stage renal disease: Secondary | ICD-10-CM | POA: Insufficient documentation

## 2013-07-11 DIAGNOSIS — Z794 Long term (current) use of insulin: Secondary | ICD-10-CM | POA: Insufficient documentation

## 2013-07-11 DIAGNOSIS — Z8669 Personal history of other diseases of the nervous system and sense organs: Secondary | ICD-10-CM | POA: Insufficient documentation

## 2013-07-11 DIAGNOSIS — I120 Hypertensive chronic kidney disease with stage 5 chronic kidney disease or end stage renal disease: Secondary | ICD-10-CM | POA: Insufficient documentation

## 2013-07-11 DIAGNOSIS — Z79899 Other long term (current) drug therapy: Secondary | ICD-10-CM | POA: Insufficient documentation

## 2013-07-11 DIAGNOSIS — Z7982 Long term (current) use of aspirin: Secondary | ICD-10-CM | POA: Insufficient documentation

## 2013-07-11 LAB — POCT I-STAT, CHEM 8
BUN: 29 mg/dL — ABNORMAL HIGH (ref 6–23)
Calcium, Ion: 1.14 mmol/L (ref 1.13–1.30)
Chloride: 102 mEq/L (ref 96–112)
Creatinine, Ser: 7.1 mg/dL — ABNORMAL HIGH (ref 0.50–1.10)
Glucose, Bld: 66 mg/dL — ABNORMAL LOW (ref 70–99)
HEMATOCRIT: 29 % — AB (ref 36.0–46.0)
HEMOGLOBIN: 9.9 g/dL — AB (ref 12.0–15.0)
POTASSIUM: 4.1 meq/L (ref 3.7–5.3)
SODIUM: 139 meq/L (ref 137–147)
TCO2: 27 mmol/L (ref 0–100)

## 2013-07-11 LAB — CBC WITH DIFFERENTIAL/PLATELET
BASOS ABS: 0 10*3/uL (ref 0.0–0.1)
BASOS PCT: 0 % (ref 0–1)
Eosinophils Absolute: 0.1 10*3/uL (ref 0.0–0.7)
Eosinophils Relative: 1 % (ref 0–5)
HCT: 26.1 % — ABNORMAL LOW (ref 36.0–46.0)
HEMOGLOBIN: 8.6 g/dL — AB (ref 12.0–15.0)
LYMPHS PCT: 9 % — AB (ref 12–46)
Lymphs Abs: 1.2 10*3/uL (ref 0.7–4.0)
MCH: 29 pg (ref 26.0–34.0)
MCHC: 33 g/dL (ref 30.0–36.0)
MCV: 87.9 fL (ref 78.0–100.0)
Monocytes Absolute: 1.4 10*3/uL — ABNORMAL HIGH (ref 0.1–1.0)
Monocytes Relative: 11 % (ref 3–12)
NEUTROS ABS: 10.5 10*3/uL — AB (ref 1.7–7.7)
Neutrophils Relative %: 80 % — ABNORMAL HIGH (ref 43–77)
Platelets: 174 10*3/uL (ref 150–400)
RBC: 2.97 MIL/uL — ABNORMAL LOW (ref 3.87–5.11)
RDW: 14.6 % (ref 11.5–15.5)
WBC: 13.2 10*3/uL — AB (ref 4.0–10.5)

## 2013-07-11 LAB — GLUCOSE, CAPILLARY
GLUCOSE-CAPILLARY: 68 mg/dL — AB (ref 70–99)
GLUCOSE-CAPILLARY: 107 mg/dL — AB (ref 70–99)
Glucose-Capillary: 68 mg/dL — ABNORMAL LOW (ref 70–99)
Glucose-Capillary: 155 mg/dL — ABNORMAL HIGH (ref 70–99)

## 2013-07-11 MED ORDER — SODIUM CHLORIDE 0.9 % IV SOLN: INTRAVENOUS | Status: DC

## 2013-07-11 MED ORDER — DEXTROSE-NACL 5-0.9 % IV SOLN
INTRAVENOUS | Status: DC
  Administered 2013-07-11: 04:00:00 via INTRAVENOUS

## 2013-07-11 NOTE — ED Notes (Signed)
Bair hugger placed on pt.

## 2013-07-11 NOTE — ED Notes (Signed)
EDP [facility] notified pts rectal temperature was 92.40F.

## 2013-07-11 NOTE — ED Notes (Addendum)
Per EMS, pt was found in the bathroom at home unresponsive. Pt had CBG of 34 on site. Pt was given glucagon and 12.5 of D50 by EMS. LAST CBG 228. Pt able to follow commands. Alert x4 at this time. 18 gauge left A/C. Pt started  Dialysis last week. Pt is supposed to go to dialysis at 11:00 today.

## 2013-07-11 NOTE — ED Notes (Signed)
EDP [facility] notified of pts temperature of 92.1 rectally and CBG: 68.

## 2013-07-11 NOTE — Discharge Instructions (Signed)

## 2013-07-11 NOTE — ED Notes (Signed)
CBG 155 

## 2013-07-11 NOTE — ED Provider Notes (Signed)
CSN: [id number]     Arrival date & time 07/11/13  0340 History   First MD Initiated Contact with Patient 07/11/13 0434     Chief Complaint  Patient presents with  . Hypoglycemia   (Consider location/radiation/quality/duration/timing/severity/associated sxs/prior Treatment) HPI Level 5 Caveat: altered LOC. This is a 73 year old female with diabetes and end-stage renal disease on hemodialysis. Her son assisted her to the bathroom this morning about an hour PTA. She did not return from the bathroom as expected so he checked on her and found her to be unresponsive. EMS was called and they found her sugar to be 35 and noted decorticate posturing. She was administered glucagon and subsequently D50 with improvement in her sugar to the 200s but no significant improvement in mental status. She was noted to moan in route upon arrival is still altered. She is nonverbal but will squeeze fingers on command per EMS. She was noted to be hypothermic on arrival.  Past Medical History  Diagnosis Date  . Hypertension   . Cataract   . Diabetes mellitus   . Chronic kidney disease   . Shortness of breath   . Anemia    Past Surgical History  Procedure Laterality Date  . No past surgeries    . Cataract extraction w/phaco  11/20/2011    Procedure: CATARACT EXTRACTION PHACO AND INTRAOCULAR LENS PLACEMENT (IOC);  Surgeon: Tonny Branch, MD;  Location: [facility];  Service: Ophthalmology;  Laterality: Right;  CDE 18.82  . Cataract extraction w/phaco Left 11/18/2012    Procedure: CATARACT EXTRACTION PHACO AND INTRAOCULAR LENS PLACEMENT (IOC);  Surgeon: Tonny Branch, MD;  Location: [facility];  Service: Ophthalmology;  Laterality: Left;  CDE: 18.97  . Colonoscopy N/A 02/09/2013    Procedure: COLONOSCOPY;  Surgeon: Rogene Houston, MD;  Location: [facility];  Service: Endoscopy;  Laterality: N/A;  305-moved to 220 Ann to notify pt  . Insertion of dialysis catheter Right 06/24/2013    Procedure: INSERTION OF DIALYSIS CATHETER:  Ultrasound guided;  Surgeon: Serafina Mitchell, MD;  Location: [facility];  Service: Vascular;  Laterality: Right;   Family History  Problem Relation Age of Onset  . Anesthesia problems Neg Hx   . Hypotension Neg Hx   . Malignant hyperthermia Neg Hx   . Pseudochol deficiency Neg Hx   . Cancer Sister   . Cancer Brother    History  Substance Use Topics  . Smoking status: Never Smoker   . Smokeless tobacco: Never Used  . Alcohol Use: No   OB History   Grav Para Term Preterm Abortions TAB SAB Ect Mult Living                 Review of Systems  Unable to perform ROS   Allergies  Review of patient's allergies indicates no known allergies.  Home Medications   Current Outpatient Rx  Name  Route  Sig  Dispense  Refill  . aspirin EC 81 MG EC tablet   Oral   Take 1 tablet (81 mg total) by mouth daily.         . calcium carbonate (TUMS - DOSED IN MG ELEMENTAL CALCIUM) 500 MG chewable tablet   Oral   Chew 1 tablet (200 mg of elemental calcium total) by mouth 3 (three) times daily with meals.         . cholecalciferol (VITAMIN D) 1000 UNITS tablet   Oral   Take 1,000 Units by mouth daily.         Marland Kitchen  Cyanocobalamin (VITAMIN B-12 IJ)   Injection   Inject 1 application as directed every 30 (thirty) days.         . insulin NPH Human (HUMULIN N,NOVOLIN N) 100 UNIT/ML injection   Subcutaneous   Inject 10 Units into the skin 2 (two) times daily at 8 am and 10 pm.   10 mL   1   . labetalol (NORMODYNE) 200 MG tablet   Oral   Take 200 mg by mouth 2 (two) times daily.         . metoCLOPramide (REGLAN) 10 MG tablet   Oral   Take 1 tablet (10 mg total) by mouth 3 (three) times daily before meals.   90 tablet   0   . minoxidil (LONITEN) 2.5 MG tablet   Oral   Take 2 tablets (5 mg total) by mouth 2 (two) times daily.   60 tablet   0   . multivitamin (RENA-VIT) TABS tablet   Oral   Take 1 tablet by mouth at bedtime.   30 tablet   0   . pantoprazole (PROTONIX) 40 MG  tablet   Oral   Take 1 tablet (40 mg total) by mouth daily with supper.   30 tablet   0   . sodium chloride 0.9 % SOLN 100 mL with ferric gluconate 12.5 MG/ML SOLN 125 mg   Intravenous   Inject 125 mg into the vein every Monday, Wednesday, and Friday with hemodialysis.          BP 126/75  Temp(Src) 92.1 F (33.4 C) (Rectal)  Resp 14  SpO2 99%  Physical Exam General: Well-developed, well-nourished female in no acute distress; appearance consistent with age of record HENT: normocephalic; atraumatic Eyes: pupils equal, round and reactive to light; wandering gaze Neck: supple Heart: regular rate and rhythm Lungs: clear to auscultation bilaterally Chest: Vas-Cath in place Abdomen: soft; nondistended; bowel sounds present Extremities: No deformity; full range of motion; pulses normal; no edema Neurologic: Nearly obtunded, will squeeze fingers to voice, nonverbal; noted to move all extremities Skin: Warm and dry    ED Course  Procedures (including critical care time)     MDM   Nursing notes and vitals signs, including pulse oximetry, reviewed.  Summary of this visit's results, reviewed by myself:  Labs:  Results for orders placed during the hospital encounter of 07/11/13 (from the past 24 hour(s))  CBC WITH DIFFERENTIAL     Status: Abnormal   Collection Time    07/11/13  3:55 AM      Result Value Range   WBC 13.2 (*) 4.0 - 10.5 K/uL   RBC 2.97 (*) 3.87 - 5.11 MIL/uL   Hemoglobin 8.6 (*) 12.0 - 15.0 g/dL   HCT 26.1 (*) 36.0 - 46.0 %   MCV 87.9  78.0 - 100.0 fL   MCH 29.0  26.0 - 34.0 pg   MCHC 33.0  30.0 - 36.0 g/dL   RDW 14.6  11.5 - 15.5 %   Platelets 174  150 - 400 K/uL   Neutrophils Relative % 80 (*) 43 - 77 %   Neutro Abs 10.5 (*) 1.7 - 7.7 K/uL   Lymphocytes Relative 9 (*) 12 - 46 %   Lymphs Abs 1.2  0.7 - 4.0 K/uL   Monocytes Relative 11  3 - 12 %   Monocytes Absolute 1.4 (*) 0.1 - 1.0 K/uL   Eosinophils Relative 1  0 - 5 %   Eosinophils Absolute 0.1   0.0 -  0.7 K/uL   Basophils Relative 0  0 - 1 %   Basophils Absolute 0.0  0.0 - 0.1 K/uL  GLUCOSE, CAPILLARY     Status: Abnormal   Collection Time    07/11/13  3:57 AM      Result Value Range   Glucose-Capillary 68 (*) 70 - 99 mg/dL  POCT I-STAT, CHEM 8     Status: Abnormal   Collection Time    07/11/13  4:00 AM      Result Value Range   Sodium 139  137 - 147 mEq/L   Potassium 4.1  3.7 - 5.3 mEq/L   Chloride 102  96 - 112 mEq/L   BUN 29 (*) 6 - 23 mg/dL   Creatinine, Ser 7.10 (*) 0.50 - 1.10 mg/dL   Glucose, Bld 66 (*) 70 - 99 mg/dL   Calcium, Ion 1.14  1.13 - 1.30 mmol/L   TCO2 27  0 - 100 mmol/L   Hemoglobin 9.9 (*) 12.0 - 15.0 g/dL   HCT 29.0 (*) 36.0 - 46.0 %    4:57 AM Patient now awake and alert and, per her family, at her baseline mentally. She is moving all extremities well. She is oriented to person and place. Family reports that she ate dinner yesterday evening without difficulty. She has not had any recent illness or fever. The patient herself denies any symptoms apart from feeling tired.   7:49 AM Patient sugar is 155 and has not required any additional IV glucose after being given a meal and some orange juice. She is awake and alert. Her oral temperature is 97 orally. Her family is ready to take her home in preparation for her dialysis treatment this morning.   Wynetta Fines, MD 07/11/13 [phone]

## 2013-07-11 NOTE — ED Notes (Signed)
Pt given multiple warm blankets.

## 2013-07-11 NOTE — ED Notes (Signed)
Pt given orange juice.

## 2013-07-11 NOTE — ED Notes (Signed)
Pt given [place] sandwich, apple sauce, and orange juice.

## 2013-07-12 ENCOUNTER — Emergency Department (HOSPITAL_COMMUNITY): Payer: Medicare Other

## 2013-07-12 ENCOUNTER — Encounter (HOSPITAL_COMMUNITY): Payer: Self-pay | Admitting: Emergency Medicine

## 2013-07-12 ENCOUNTER — Emergency Department (HOSPITAL_COMMUNITY)
Admission: EM | Admit: 2013-07-12 | Discharge: 2013-07-12 | Disposition: A | Discharge status: Home / Self Care | Payer: Medicare Other | Attending: Emergency Medicine | Admitting: Emergency Medicine

## 2013-07-12 DIAGNOSIS — N189 Chronic kidney disease, unspecified: Secondary | ICD-10-CM | POA: Insufficient documentation

## 2013-07-12 DIAGNOSIS — Z79899 Other long term (current) drug therapy: Secondary | ICD-10-CM | POA: Insufficient documentation

## 2013-07-12 DIAGNOSIS — Z7982 Long term (current) use of aspirin: Secondary | ICD-10-CM | POA: Insufficient documentation

## 2013-07-12 DIAGNOSIS — T8089XA Other complications following infusion, transfusion and therapeutic injection, initial encounter: Secondary | ICD-10-CM

## 2013-07-12 DIAGNOSIS — I129 Hypertensive chronic kidney disease with stage 1 through stage 4 chronic kidney disease, or unspecified chronic kidney disease: Secondary | ICD-10-CM | POA: Insufficient documentation

## 2013-07-12 DIAGNOSIS — Z862 Personal history of diseases of the blood and blood-forming organs and certain disorders involving the immune mechanism: Secondary | ICD-10-CM | POA: Insufficient documentation

## 2013-07-12 DIAGNOSIS — E119 Type 2 diabetes mellitus without complications: Secondary | ICD-10-CM | POA: Insufficient documentation

## 2013-07-12 DIAGNOSIS — Z8669 Personal history of other diseases of the nervous system and sense organs: Secondary | ICD-10-CM | POA: Insufficient documentation

## 2013-07-12 DIAGNOSIS — Z794 Long term (current) use of insulin: Secondary | ICD-10-CM | POA: Insufficient documentation

## 2013-07-12 DIAGNOSIS — L988 Other specified disorders of the skin and subcutaneous tissue: Secondary | ICD-10-CM | POA: Insufficient documentation

## 2013-07-12 MED ORDER — ACETAMINOPHEN 325 MG PO TABS
650.0000 mg | ORAL_TABLET | Freq: Once | ORAL | Status: AC
  Administered 2013-07-12: 650 mg via ORAL
  Filled 2013-07-12: qty 2

## 2013-07-12 NOTE — ED Provider Notes (Signed)
CSN: [id number]     Arrival date & time 07/12/13  [id number] History   First MD Initiated Contact with Patient 07/12/13 [phone]     Chief Complaint  Patient presents with  . Arm Pain    HPI Pt was seen at 0800. Per pt and her family, c/o gradual onset and persistence of constant right deltoid area "pain" that began yesterday. States the pain began after "they gave me a shot in that spot" when she was at her usual HD appointment. She does not know what "the shot" was. States the pain worsens when she moves her arm and palpates the area. Denies fevers, no rash, no red streaking, no injury, no focal motor weakness, no tingling/numbness in extremity, no neck pain, no CP/SOB, no abd pain.    Past Medical History  Diagnosis Date  . Hypertension   . Cataract   . Diabetes mellitus   . Chronic kidney disease   . Shortness of breath   . Anemia    Past Surgical History  Procedure Laterality Date  . No past surgeries    . Cataract extraction w/phaco  11/20/2011    Procedure: CATARACT EXTRACTION PHACO AND INTRAOCULAR LENS PLACEMENT (IOC);  Surgeon: Tonny Branch, MD;  Location: [facility];  Service: Ophthalmology;  Laterality: Right;  CDE 18.82  . Cataract extraction w/phaco Left 11/18/2012    Procedure: CATARACT EXTRACTION PHACO AND INTRAOCULAR LENS PLACEMENT (IOC);  Surgeon: Tonny Branch, MD;  Location: [facility];  Service: Ophthalmology;  Laterality: Left;  CDE: 18.97  . Colonoscopy N/A 02/09/2013    Procedure: COLONOSCOPY;  Surgeon: Rogene Houston, MD;  Location: [facility];  Service: Endoscopy;  Laterality: N/A;  305-moved to 220 Ann to notify pt  . Insertion of dialysis catheter Right 06/24/2013    Procedure: INSERTION OF DIALYSIS CATHETER: Ultrasound guided;  Surgeon: Serafina Mitchell, MD;  Location: [facility];  Service: Vascular;  Laterality: Right;   Family History  Problem Relation Age of Onset  . Anesthesia problems Neg Hx   . Hypotension Neg Hx   . Malignant hyperthermia Neg Hx   . Pseudochol deficiency Neg  Hx   . Cancer Sister   . Cancer Brother    History  Substance Use Topics  . Smoking status: Never Smoker   . Smokeless tobacco: Never Used  . Alcohol Use: No    Review of Systems ROS: Statement: All systems negative except as marked or noted in the HPI; Constitutional: Negative for fever and chills. ; ; Eyes: Negative for eye pain, redness and discharge. ; ; ENMT: Negative for ear pain, hoarseness, nasal congestion, sinus pressure and sore throat. ; ; Cardiovascular: Negative for chest pain, palpitations, diaphoresis, dyspnea and peripheral edema. ; ; Respiratory: Negative for cough, wheezing and stridor. ; ; Gastrointestinal: Negative for nausea, vomiting, diarrhea, abdominal pain, blood in stool, hematemesis, jaundice and rectal bleeding. . ; ; Genitourinary: Negative for dysuria, flank pain and hematuria. ; ; Musculoskeletal: +right deltoid pain. Negative for back pain and neck pain. Negative for swelling and trauma.; ; Skin: Negative for pruritus, rash, abrasions, blisters, bruising and skin lesion.; ; Neuro: Negative for headache, lightheadedness and neck stiffness. Negative for weakness, altered level of consciousness , altered mental status, extremity weakness, paresthesias, involuntary movement, seizure and syncope.      Allergies  Ambien  Home Medications   Current Outpatient Rx  Name  Route  Sig  Dispense  Refill  . aspirin EC 81 MG EC tablet   Oral  Take 1 tablet (81 mg total) by mouth daily.         . calcium carbonate (TUMS - DOSED IN MG ELEMENTAL CALCIUM) 500 MG chewable tablet   Oral   Chew 1 tablet (200 mg of elemental calcium total) by mouth 3 (three) times daily with meals.         . cholecalciferol (VITAMIN D) 1000 UNITS tablet   Oral   Take 1,000 Units by mouth daily.         . Cyanocobalamin (VITAMIN B-12 IJ)   Injection   Inject 1 application as directed every 30 (thirty) days.         . insulin NPH Human (HUMULIN N,NOVOLIN N) 100 UNIT/ML  injection   Subcutaneous   Inject 10 Units into the skin 2 (two) times daily at 8 am and 10 pm.   10 mL   1   . labetalol (NORMODYNE) 200 MG tablet   Oral   Take 200 mg by mouth 2 (two) times daily.         . metoCLOPramide (REGLAN) 10 MG tablet   Oral   Take 1 tablet (10 mg total) by mouth 3 (three) times daily before meals.   90 tablet   0   . minoxidil (LONITEN) 2.5 MG tablet   Oral   Take 2 tablets (5 mg total) by mouth 2 (two) times daily.   60 tablet   0   . multivitamin (RENA-VIT) TABS tablet   Oral   Take 1 tablet by mouth at bedtime.   30 tablet   0   . pantoprazole (PROTONIX) 40 MG tablet   Oral   Take 1 tablet (40 mg total) by mouth daily with supper.   30 tablet   0   . sodium chloride 0.9 % SOLN 100 mL with ferric gluconate 12.5 MG/ML SOLN 125 mg   Intravenous   Inject 125 mg into the vein every Monday, Wednesday, and Friday with hemodialysis.          BP 137/53  Pulse 75  Temp(Src) 98.2 F (36.8 C) (Oral)  Resp 16  SpO2 97% Physical Exam 0825: Physical examination:  Nursing notes reviewed; Vital signs and O2 SAT reviewed;  Constitutional: Well developed, Well nourished, Well hydrated, In no acute distress; Head:  Normocephalic, atraumatic; Eyes: EOMI, PERRL, No scleral icterus; ENMT: Mouth and pharynx normal, Mucous membranes moist. Mouth and pharynx without lesions. No tonsillar exudates. No intra-oral edema. No submandibular or sublingual edema. No hoarse voice, no drooling, no stridor. No pain with manipulation of larynx.; Neck: Supple, Full range of motion, No lymphadenopathy; Cardiovascular: Regular rate and rhythm, No gallop; Respiratory: Breath sounds clear & equal bilaterally, No rales, rhonchi, wheezes.  Speaking full sentences with ease, Normal respiratory effort/excursion; Chest: Nontender, Movement normal; Abdomen: Soft, Nontender, Nondistended, Normal bowel sounds; Genitourinary: No CVA tenderness; Extremities: Right shoulder w/FROM. +mild  TTP over right AC joint. Clavicle NT, scapula NT, proximal humerus NT, biceps tendon NT over bicipital groove.  Motor strength at shoulder normal.  Sensation intact over deltoid region, distal NMS intact with right hand having intact and equal sensation and strength in the distribution of the median, radial, and ulnar nerve function compared to opposite side.  Strong radial pulse.  +FROM right elbow with intact motor strength biceps and triceps muscles to resistance. +pinpoint scab to right anterior deltoid area (where pt states she was given "a shot") with mild localized tenderness and without erythema, streaking, edema, fluctuance, open wound,  drainage, ecchymosis or soft tissue crepitus. Pulses normal, No deformity, no erythema. No edema, No calf edema or asymmetry.; Neuro: AA&Ox3, Major CN grossly intact.  Speech clear. No gross focal motor or sensory deficits in extremities. Climbs on and off stretcher easily by herself. Gait steady.; Skin: Color normal, Warm, Dry.   ED Course  Procedures   EKG Interpretation   None       MDM  MDM Reviewed: previous chart, nursing note and vitals Interpretation: x-ray     Dg Shoulder Right 07/12/2013   CLINICAL DATA:  Right shoulder pain after getting shots and right arm yesterday.  EXAM: RIGHT SHOULDER - 2+ VIEW  COMPARISON:  None.  FINDINGS: There is no evidence of acute fracture or dislocation. Minimal spurring is noted at the margins of the glenoid and acromioclavicular joint. No soft tissue abnormality is identified. Right jugular dual-lumen central venous catheter is partially visualized.  IMPRESSION: No acute osseous abnormality seen.   Electronically Signed   By: Logan Bores   On: 07/12/2013 08:41     [id number]:  ED RN called HD facility: pt received pneumonia vaccine right deltoid and TB test right forearm.  Pt and family made aware. No signs of infection or abscess at site. No generalized allergic reaction. XR without acute findings. Will tx  symptomatically at this time. Dx and testing d/w pt and family.  Questions answered.  Verb understanding, agreeable to d/c home with outpt f/u.     Alfonzo Feller, DO 07/14/13 2135

## 2013-07-12 NOTE — ED Notes (Signed)
RN spoke with Kenney Houseman at [facility], [phone]. Pt received pneumonia vaccine in right deltoid and TB skin test in right forearm on 07/11/13.

## 2013-07-12 NOTE — Discharge Instructions (Signed)
°Emergency Department Resource Guide °1) Find a Doctor and Pay Out of Pocket °Although you won't have to find out who is covered by your insurance plan, it is a good idea to ask around and get recommendations. You will then need to call the office and see if the doctor you have chosen will accept you as a new patient and what types of options they offer for patients who are self-pay. Some doctors offer discounts or will set up payment plans for their patients who do not have insurance, but you will need to ask so you aren't surprised when you get to your appointment. ° °2) Contact Your Local Health Department °Not all health departments have doctors that can see patients for sick visits, but many do, so it is worth a call to see if yours does. If you don't know where your local health department is, you can check in your phone book. The CDC also has a tool to help you locate your state's health department, and many state websites also have listings of all of their local health departments. ° °3) Find a Walk-in Clinic °If your illness is not likely to be very severe or complicated, you may want to try a walk in clinic. These are popping up all over the country in pharmacies, drugstores, and shopping centers. They're usually staffed by nurse practitioners or physician assistants that have been trained to treat common illnesses and complaints. They're usually fairly quick and inexpensive. However, if you have serious medical issues or chronic medical problems, these are probably not your best option. ° °No Primary Care Doctor: °- Call [facility] at  832-8000 - they can help you locate a primary care doctor that  accepts your insurance, provides certain services, etc. °- Physician Referral Service- 1-800-533-3463 ° °Chronic Pain Problems: °Organization         Address  Phone   Notes  °[facility]  (336) 297-2271 Patients need to be referred by their primary care doctor.  ° °Medication  Assistance: °Organization         Address  Phone   Notes  °[place] [facility] [address] °[place], [place] [address] (336) 641-8030 --Must be a resident of [place] °-- Must have NO insurance coverage whatsoever (no Medicaid/ Medicare, etc.) °-- The pt. MUST have a primary care doctor that directs their care regularly and follows them in the community °  °[facility]  (866) 331-1348   °[employer]  (888) 892-1162   ° °Agencies that provide inexpensive medical care: °Organization         Address  Phone   Notes  °[facility]  (336) 832-8035   °[facility]    (336) 832-7272   °[facility] [address] °[place], [place] [address] (336) 832-4777   °[facility] [address], °[place] (336) 271-4999   °[facility]    (336) 373-0678   °[facility]    (336) 272-1050   °[facility] ° [address], [place] Phone:  (336) 832-4444, Fax:  (336) 832-4440 Hours of Operation:  9 am - 6 pm, M-F.  Also accepts Medicaid/Medicare and self-pay.  °[facility] ° [address], [place] Phone: (336) 832-3150, Fax: (336) [phone]. Hours of Operation:  8:30 am - 5:30 pm, M-F.  Also accepts Medicaid and self-pay.  °[facility] [place] [address], [place] Phone: (336) 878-6027   °[facility] [address], [place], [place] (336)723-1848, Ext. 123 Mondays & Thursdays: 7-9 AM.  First 15 patients are seen on a first come, first serve basis. °  ° °Medicaid-accepting [place] Providers: ° °Organization         Address  Phone   Notes  °[facility] [address], [place] (336) 641-2100 Also accepts self-pay patients.  °[facility] [address], [place] ° (336) 856-9996   °[facility] [address], [place]  (336) 288-8857   °[facility] [address] [place] [address], [place] (336) 299-7000   °Veita Bland [address], [place]  ° (336) 373-1557 Only accepts [place] Access Medicaid patients after they have their name applied to their card.  ° °Self-Pay (no insurance) in [place]: ° °Organization         Address  Phone   Notes  °Sickle Cell Patients, [facility] [address], [place] (336) 832-1970   °[facility] [address], [place] (336) 832-4400   °[facility] [place] ° [address], [place] (336) 992-4800   °[facility]/Dr. Osei-Bonsu ° [address] [place] [address], [place] or [address], [place] (336) 841-8500 Phone number for both [place] and [place] locations is the same.  °[facility] [address], [place] (336) 299-0000   °[facility] [address] [place] [address], [place] or [address] (336) 852-7530 °(336) 878-2260   °[facility] [address],  (336) 350-1642, phone; (336) 294-5005, fax Sees patients 1st and 3rd Saturday of every month.  Must not qualify for public or private insurance (i.e. Medicaid, Medicare, [place] Health Choice, Veterans' Benefits) • Household income should be no more than 200% of the poverty level •The clinic cannot treat you if you are pregnant or think you are pregnant • Sexually transmitted diseases are not treated at the clinic.  ° ° °Dental Care: °Organization         Address  Phone  Notes  °[place] [facility] [address],  (336) 641-6152 Accepts children up to age 21 who are enrolled in Medicaid or [place] Health Choice; pregnant women with a Medicaid card; and children who have applied for Medicaid or [place] Health Choice, but were declined, whose parents can pay a reduced fee at time of service.  °[place]  [facility] [place]  [address], [place] (336) 641-7733 Accepts children up to age 21 who are enrolled in Medicaid or [place] Health Choice; pregnant women with a Medicaid card; and children who have applied for Medicaid or [place] Health Choice, but were declined, whose parents can pay a reduced fee at time of service.  °[facility] ° [address],  (336) 641-4533 Patients are seen by appointment only. Walk-ins are not accepted. [facility] will see patients 18 years of age and older. °Monday - Tuesday (8am-5pm) °Most Wednesdays (8:30-5pm) °$30 per visit, cash only  °[facility] ° [address], [place] (336) 641-4533 Patients are seen by appointment only. Walk-ins are not accepted. [facility] will see patients 18 years of age and older. °One   Wednesday Evening (Monthly: Volunteer Based).  $30 per visit, cash only  °[facility]  (919) 537-3737 for adults; Children under age 4, call Graduate Pediatric Dentistry at (919) [phone]. Children aged 4-14, please call (919) [phone] to request a pediatric application. ° Dental services are provided in all areas of dental care including fillings, crowns and bridges, complete and partial dentures, implants, gum treatment, root canals, and extractions. Preventive care is also provided. Treatment is provided to both adults and children. °Patients are selected via a lottery and there is often a waiting list. °  °[facility] [address], °[place] ° (336) 763-8833 [email or web address] °  °[facility] [address], [place], [place] (336)723-1848, Ext. 123 Second and Fourth Thursday of each month, opens at 6:30 AM; Clinic ends at 9 AM.  Patients are seen on a first-come first-served basis, and a limited number are seen during each clinic.  ° °[facility] ° [address], [place], [place] (336) 723-7904    Eligibility Requirements °You must have lived in [place], [place], or [place] counties for at least the last three months. °  You cannot be eligible for state or federal sponsored healthcare insurance, including Veterans Administration, Medicaid, or Medicare. °  You generally cannot be eligible for healthcare insurance through your employer.  °  How to apply: °Eligibility screenings are held every Tuesday and Wednesday afternoon from 1:00 pm until 4:00 pm. You do not need an appointment for the interview!  °[facility] [address], [place], [place] 336-631-2330   °[facility]  336-342-8273   °[facility]  336-703-3100   °[facility]  336-570-6415   ° °Behavioral Health Resources in the Community: °Intensive Outpatient Programs °Organization         Address  Phone  Notes  °[facility] [address], [place], [place] 336-878-6098   °[facility] [address], [place], [place] 336-832-9800   °[facility] [address], [place], [place] ° 336-882-2125   °[facility] [address],  °[place], [place] 1-800-853-5163 or 336-641-4981   °Substance Abuse Resources °Organization         Address  Phone  Notes  °[facility]  336-882-2125   °[facility]  336-784-9470   °[facility]  336-285-9073   °[facility]  336-845-3988   °Residential & Outpatient Substance Abuse Program  1-800-659-3381   °Psychological Services °Organization         Address  Phone  Notes  °[facility]  336- 832-9600   °[facility]  336- 378-7881   °[facility] [address], [place] 1-800-853-5163 or 336-641-4981   ° °Mobile Crisis Teams °Organization         Address  Phone  Notes  °[facility], Mobile Crisis Care Unit  1-877-626-1772   °Assertive °[facility] ° [address]  [place], [place] 336-834-9664   °Sharon DeEsch [address] °[place] 336-554-5454   ° °Self-Help/Support Groups °Organization         Address  Phone             Notes  °[facility] [place] - variety of support groups  336- 373-1402 Call for more information  °Narcotics Anonymous (NA), [facility] [address], °[place]  2 meetings at this location  ° °  Residential Treatment Programs Organization         Address  Phone  Notes  [facility] [address]    [place]  1-705-488-9830   [facility]  [address] [place] [id number], [place], [place]   [place], [place] [phone] Admissions: 8am-3pm M-F  [facility] [place] [address],    [name], [place] [id number]   [facility] [address] [place], [place], [place]   [facility] [address]  [place], [place]   [facility] - Intensive Outpatient [address], [place], [place]   [facility] [address]  [place], [place] 1-531-232-7226 or (478) 123-1569   [facility] [address] [place], [place] Accepts Medicaid  [facility] [address],  [place] 1-928-800-3294 Substance Abuse/Addiction Treatment   [facility]         Address  Phone  Notes  [facility]  517-573-0409   Domenic Schwab, PhD [address] Arlis Porta [place], [place]   (513) 582-3190 or 954-525-4517   [place], [place] (807) 172-4971   [facility] [address] [place], [place] 402-175-6715 Insurance/Medicaid/sponsorship through [facility] [address] [place]                                    [place], [place] 772-101-5737 [place] [address], [place] 321-296-9176    Dr. Adele Schilder  [phone]   [facility] Dept. 1) [address], [place] 2) [place] 3)  [address], [place] [phone]  267-184-9586   [place] 414-639-2590 or [phone] (After Hours)       Take your usual prescriptions as previously directed. Take over the counter tylenol, as directed on packaging, as needed for discomfort. There are no signs of infection at the injection site today. Apply moist heat or ice to the area(s) of discomfort, for 15 minutes at a time, several times per day for the next few days.  Do not fall asleep on a heating or ice pack.  Call your regular medical doctor today to schedule a follow up appointment in the next 2 days.  Return to the Emergency Department immediately if worsening.

## 2013-07-12 NOTE — ED Notes (Signed)
Received injection in right arm yesterday at dialysis.  Reports pain to right shoulder/upper arm last night.  Denies injury.

## 2013-07-15 ENCOUNTER — Other Ambulatory Visit (HOSPITAL_COMMUNITY): Payer: Self-pay | Admitting: Family Medicine

## 2013-07-15 DIAGNOSIS — R2681 Unsteadiness on feet: Secondary | ICD-10-CM

## 2013-07-15 DIAGNOSIS — G4700 Insomnia, unspecified: Secondary | ICD-10-CM

## 2013-07-19 ENCOUNTER — Ambulatory Visit (HOSPITAL_COMMUNITY)
Admission: RE | Admit: 2013-07-19 | Discharge: 2013-07-19 | Disposition: A | Discharge status: Home / Self Care | Payer: Medicare Other | Source: Ambulatory Visit | Attending: Family Medicine | Admitting: Family Medicine

## 2013-07-19 DIAGNOSIS — G4700 Insomnia, unspecified: Secondary | ICD-10-CM

## 2013-07-19 DIAGNOSIS — R2681 Unsteadiness on feet: Secondary | ICD-10-CM

## 2013-07-20 ENCOUNTER — Encounter (HOSPITAL_COMMUNITY): Payer: Self-pay | Admitting: Emergency Medicine

## 2013-07-20 ENCOUNTER — Inpatient Hospital Stay (HOSPITAL_COMMUNITY): Payer: Medicare Other

## 2013-07-20 ENCOUNTER — Inpatient Hospital Stay (HOSPITAL_COMMUNITY)
Admission: EM | Admit: 2013-07-20 | Discharge: 2013-07-22 | DRG: 040 | Disposition: A | Discharge status: Home Health | Payer: Medicare Other | Attending: Internal Medicine | Admitting: Internal Medicine

## 2013-07-20 DIAGNOSIS — R109 Unspecified abdominal pain: Secondary | ICD-10-CM

## 2013-07-20 DIAGNOSIS — K3184 Gastroparesis: Secondary | ICD-10-CM | POA: Diagnosis present

## 2013-07-20 DIAGNOSIS — G4700 Insomnia, unspecified: Secondary | ICD-10-CM | POA: Diagnosis present

## 2013-07-20 DIAGNOSIS — E1159 Type 2 diabetes mellitus with other circulatory complications: Secondary | ICD-10-CM | POA: Diagnosis present

## 2013-07-20 DIAGNOSIS — D7289 Other specified disorders of white blood cells: Secondary | ICD-10-CM

## 2013-07-20 DIAGNOSIS — E1149 Type 2 diabetes mellitus with other diabetic neurological complication: Secondary | ICD-10-CM | POA: Diagnosis present

## 2013-07-20 DIAGNOSIS — I639 Cerebral infarction, unspecified: Secondary | ICD-10-CM | POA: Diagnosis present

## 2013-07-20 DIAGNOSIS — E1143 Type 2 diabetes mellitus with diabetic autonomic (poly)neuropathy: Secondary | ICD-10-CM

## 2013-07-20 DIAGNOSIS — Z888 Allergy status to other drugs, medicaments and biological substances status: Secondary | ICD-10-CM

## 2013-07-20 DIAGNOSIS — F29 Unspecified psychosis not due to a substance or known physiological condition: Secondary | ICD-10-CM | POA: Diagnosis present

## 2013-07-20 DIAGNOSIS — Z7982 Long term (current) use of aspirin: Secondary | ICD-10-CM

## 2013-07-20 DIAGNOSIS — I6350 Cerebral infarction due to unspecified occlusion or stenosis of unspecified cerebral artery: Principal | ICD-10-CM | POA: Diagnosis present

## 2013-07-20 DIAGNOSIS — I6789 Other cerebrovascular disease: Secondary | ICD-10-CM | POA: Diagnosis present

## 2013-07-20 DIAGNOSIS — D649 Anemia, unspecified: Secondary | ICD-10-CM

## 2013-07-20 DIAGNOSIS — R269 Unspecified abnormalities of gait and mobility: Secondary | ICD-10-CM | POA: Diagnosis present

## 2013-07-20 DIAGNOSIS — N186 End stage renal disease: Secondary | ICD-10-CM | POA: Diagnosis present

## 2013-07-20 DIAGNOSIS — I4891 Unspecified atrial fibrillation: Secondary | ICD-10-CM | POA: Diagnosis present

## 2013-07-20 DIAGNOSIS — Z992 Dependence on renal dialysis: Secondary | ICD-10-CM

## 2013-07-20 DIAGNOSIS — E119 Type 2 diabetes mellitus without complications: Secondary | ICD-10-CM | POA: Diagnosis present

## 2013-07-20 DIAGNOSIS — R609 Edema, unspecified: Secondary | ICD-10-CM

## 2013-07-20 DIAGNOSIS — E785 Hyperlipidemia, unspecified: Secondary | ICD-10-CM

## 2013-07-20 DIAGNOSIS — Z794 Long term (current) use of insulin: Secondary | ICD-10-CM

## 2013-07-20 DIAGNOSIS — Z9849 Cataract extraction status, unspecified eye: Secondary | ICD-10-CM

## 2013-07-20 DIAGNOSIS — I120 Hypertensive chronic kidney disease with stage 5 chronic kidney disease or end stage renal disease: Secondary | ICD-10-CM | POA: Diagnosis present

## 2013-07-20 DIAGNOSIS — G609 Hereditary and idiopathic neuropathy, unspecified: Secondary | ICD-10-CM

## 2013-07-20 DIAGNOSIS — H269 Unspecified cataract: Secondary | ICD-10-CM | POA: Diagnosis present

## 2013-07-20 DIAGNOSIS — I10 Essential (primary) hypertension: Secondary | ICD-10-CM | POA: Diagnosis present

## 2013-07-20 DIAGNOSIS — I498 Other specified cardiac arrhythmias: Secondary | ICD-10-CM

## 2013-07-20 DIAGNOSIS — G2581 Restless legs syndrome: Secondary | ICD-10-CM

## 2013-07-20 HISTORY — DX: Cerebral infarction, unspecified: I63.9

## 2013-07-20 LAB — CBC WITH DIFFERENTIAL/PLATELET
BASOS ABS: 0 10*3/uL (ref 0.0–0.1)
BASOS PCT: 0 % (ref 0–1)
Eosinophils Absolute: 0 10*3/uL (ref 0.0–0.7)
Eosinophils Relative: 0 % (ref 0–5)
HCT: 23.4 % — ABNORMAL LOW (ref 36.0–46.0)
Hemoglobin: 7.9 g/dL — ABNORMAL LOW (ref 12.0–15.0)
Lymphocytes Relative: 11 % — ABNORMAL LOW (ref 12–46)
Lymphs Abs: 1.4 10*3/uL (ref 0.7–4.0)
MCH: 29 pg (ref 26.0–34.0)
MCHC: 33.8 g/dL (ref 30.0–36.0)
MCV: 86 fL (ref 78.0–100.0)
MONO ABS: 1.7 10*3/uL — AB (ref 0.1–1.0)
Monocytes Relative: 14 % — ABNORMAL HIGH (ref 3–12)
NEUTROS ABS: 9.2 10*3/uL — AB (ref 1.7–7.7)
NEUTROS PCT: 75 % (ref 43–77)
PLATELETS: 194 10*3/uL (ref 150–400)
RBC: 2.72 MIL/uL — ABNORMAL LOW (ref 3.87–5.11)
RDW: 15.3 % (ref 11.5–15.5)
WBC: 12.3 10*3/uL — AB (ref 4.0–10.5)

## 2013-07-20 LAB — COMPREHENSIVE METABOLIC PANEL
ALBUMIN: 2.9 g/dL — AB (ref 3.5–5.2)
ALT: 16 U/L (ref 0–35)
AST: 16 U/L (ref 0–37)
Alkaline Phosphatase: 88 U/L (ref 39–117)
BILIRUBIN TOTAL: 0.4 mg/dL (ref 0.3–1.2)
BUN: 35 mg/dL — ABNORMAL HIGH (ref 6–23)
CHLORIDE: 99 meq/L (ref 96–112)
CO2: 25 mEq/L (ref 19–32)
Calcium: 8.3 mg/dL — ABNORMAL LOW (ref 8.4–10.5)
Creatinine, Ser: 6.43 mg/dL — ABNORMAL HIGH (ref 0.50–1.10)
GFR calc Af Amer: 7 mL/min — ABNORMAL LOW (ref >90)
GFR calc non Af Amer: 6 mL/min — ABNORMAL LOW (ref >90)
Glucose, Bld: 140 mg/dL — ABNORMAL HIGH (ref 70–99)
POTASSIUM: 4.7 meq/L (ref 3.7–5.3)
SODIUM: 138 meq/L (ref 137–147)
Total Protein: 7.1 g/dL (ref 6.0–8.3)

## 2013-07-20 LAB — URINALYSIS, ROUTINE W REFLEX MICROSCOPIC
GLUCOSE, UA: NEGATIVE mg/dL
Hgb urine dipstick: NEGATIVE
KETONES UR: 15 mg/dL — AB
NITRITE: NEGATIVE
PH: 5 (ref 5.0–8.0)
PROTEIN: 100 mg/dL — AB
Specific Gravity, Urine: 1.024 (ref 1.005–1.030)
Urobilinogen, UA: 0.2 mg/dL (ref 0.0–1.0)

## 2013-07-20 LAB — GLUCOSE, CAPILLARY
GLUCOSE-CAPILLARY: 159 mg/dL — AB (ref 70–99)
Glucose-Capillary: 120 mg/dL — ABNORMAL HIGH (ref 70–99)
Glucose-Capillary: 152 mg/dL — ABNORMAL HIGH (ref 70–99)

## 2013-07-20 LAB — URINE MICROSCOPIC-ADD ON

## 2013-07-20 LAB — OCCULT BLOOD, POC DEVICE: Fecal Occult Bld: NEGATIVE

## 2013-07-20 LAB — CG4 I-STAT (LACTIC ACID): Lactic Acid, Venous: 0.34 mmol/L — ABNORMAL LOW (ref 0.5–2.2)

## 2013-07-20 LAB — LIPASE, BLOOD: Lipase: 29 U/L (ref 11–59)

## 2013-07-20 MED ORDER — CALCIUM CARBONATE ANTACID 500 MG PO CHEW
1.0000 | CHEWABLE_TABLET | Freq: Three times a day (TID) | ORAL | Status: DC
  Administered 2013-07-20 – 2013-07-22 (×2): 200 mg via ORAL
  Filled 2013-07-20 (×9): qty 1

## 2013-07-20 MED ORDER — HEPARIN SODIUM (PORCINE) 5000 UNIT/ML IJ SOLN
5000.0000 [IU] | Freq: Three times a day (TID) | INTRAMUSCULAR | Status: DC
Start: 2013-07-20 — End: 2013-07-22
  Administered 2013-07-20 – 2013-07-22 (×5): 5000 [IU] via SUBCUTANEOUS
  Filled 2013-07-20 (×9): qty 1

## 2013-07-20 MED ORDER — DEXTROSE 5 % IV SOLN
1.0000 g | INTRAVENOUS | Status: DC
Start: 2013-07-20 — End: 2013-07-22
  Administered 2013-07-20: 1 g via INTRAVENOUS
  Filled 2013-07-20 (×3): qty 10

## 2013-07-20 MED ORDER — ASPIRIN 300 MG RE SUPP
300.0000 mg | Freq: Every day | RECTAL | Status: DC
  Filled 2013-07-20 (×3): qty 1

## 2013-07-20 MED ORDER — INSULIN ASPART 100 UNIT/ML ~~LOC~~ SOLN
0.0000 [IU] | Freq: Three times a day (TID) | SUBCUTANEOUS | Status: DC
  Administered 2013-07-20: 3 [IU] via SUBCUTANEOUS
  Administered 2013-07-22: 5 [IU] via SUBCUTANEOUS
  Administered 2013-07-22: 2 [IU] via SUBCUTANEOUS

## 2013-07-20 MED ORDER — ACETAMINOPHEN 325 MG PO TABS: 650.0000 mg | ORAL_TABLET | ORAL | Status: DC | PRN

## 2013-07-20 MED ORDER — ACETAMINOPHEN 325 MG PO TABS: 650.0000 mg | ORAL_TABLET | Freq: Four times a day (QID) | ORAL | Status: DC | PRN

## 2013-07-20 MED ORDER — ACETAMINOPHEN 650 MG RE SUPP: 650.0000 mg | RECTAL | Status: DC | PRN

## 2013-07-20 MED ORDER — LABETALOL HCL 200 MG PO TABS
200.0000 mg | ORAL_TABLET | Freq: Two times a day (BID) | ORAL | Status: DC
  Administered 2013-07-20: 200 mg via ORAL
  Filled 2013-07-20 (×5): qty 1

## 2013-07-20 MED ORDER — PANTOPRAZOLE SODIUM 40 MG PO TBEC
40.0000 mg | DELAYED_RELEASE_TABLET | Freq: Every day | ORAL | Status: DC
  Administered 2013-07-20: 40 mg via ORAL
  Filled 2013-07-20: qty 1

## 2013-07-20 MED ORDER — ASPIRIN EC 81 MG PO TBEC: 81.0000 mg | DELAYED_RELEASE_TABLET | Freq: Every day | ORAL | Status: DC

## 2013-07-20 MED ORDER — ASPIRIN 325 MG PO TABS
325.0000 mg | ORAL_TABLET | Freq: Every day | ORAL | Status: DC
  Administered 2013-07-20 – 2013-07-22 (×2): 325 mg via ORAL
  Filled 2013-07-20 (×3): qty 1

## 2013-07-20 MED ORDER — MINOXIDIL 2.5 MG PO TABS
5.0000 mg | ORAL_TABLET | Freq: Two times a day (BID) | ORAL | Status: DC
  Administered 2013-07-20: 5 mg via ORAL
  Filled 2013-07-20 (×3): qty 2

## 2013-07-20 MED ORDER — HYDROCODONE-ACETAMINOPHEN 5-325 MG PO TABS
1.0000 | ORAL_TABLET | ORAL | Status: DC | PRN
Start: 2013-07-20 — End: 2013-07-22
  Administered 2013-07-21: 2 via ORAL
  Filled 2013-07-20: qty 2

## 2013-07-20 MED ORDER — SODIUM CHLORIDE 0.9 % IV SOLN
125.0000 mg | INTRAVENOUS | Status: DC
  Administered 2013-07-22: 125 mg via INTRAVENOUS
  Filled 2013-07-20 (×2): qty 10

## 2013-07-20 MED ORDER — INSULIN NPH (HUMAN) (ISOPHANE) 100 UNIT/ML ~~LOC~~ SUSP
5.0000 [IU] | Freq: Two times a day (BID) | SUBCUTANEOUS | Status: DC
  Administered 2013-07-20 – 2013-07-22 (×3): 5 [IU] via SUBCUTANEOUS
  Filled 2013-07-20: qty 10

## 2013-07-20 MED ORDER — RENA-VITE PO TABS
1.0000 | ORAL_TABLET | Freq: Every day | ORAL | Status: DC
  Administered 2013-07-20 – 2013-07-21 (×2): 1 via ORAL
  Filled 2013-07-20 (×3): qty 1

## 2013-07-20 MED ORDER — MORPHINE SULFATE 2 MG/ML IJ SOLN: 1.0000 mg | INTRAMUSCULAR | Status: DC | PRN

## 2013-07-20 NOTE — Consult Note (Signed)
Referring Physician: Dr. Christy Gentles    Chief Complaint: Abnormal MRI consistent with acute/subacute stroke  HPI: [name] is an 73 y.o. female with a history of end-stage renal disease on hemodialysis, diabetes mellitus and hypertension, who was instructed by her PCP to come to the emergency room for further evaluation following an MRI study which showed findings consistent with acute/subacute stroke. Two small right frontal and one small left frontal lesion were noted. Family members have noted transient changes in mental status with confusion. She's also had difficulty with walking which appear to affect both lower extremities. She's had no changes in speech. She has not noted any upper extremity symptoms. NIH stroke score was 1. Time of onset of cerebral infarctions is unclear. Patient has been taking aspirin 81 mg per day.  LSN: Unclear tPA Given: No: Unclear when last seen well MRankin: 1  Past Medical History  Diagnosis Date  . Hypertension   . Cataract   . Diabetes mellitus   . Chronic kidney disease   . Shortness of breath   . Anemia     Family History  Problem Relation Age of Onset  . Anesthesia problems Neg Hx   . Hypotension Neg Hx   . Malignant hyperthermia Neg Hx   . Pseudochol deficiency Neg Hx   . Cancer Sister   . Cancer Brother      Medications: I have reviewed the patient's current medications.  ROS: History obtained from child and the patient  General ROS: negative for - chills, fatigue, fever, night sweats, weight gain or weight loss Psychological ROS: See history of present illness Ophthalmic ROS: negative for - blurry vision, double vision, eye pain or loss of vision ENT ROS: negative for - epistaxis, nasal discharge, oral lesions, sore throat, tinnitus or vertigo Allergy and Immunology ROS: negative for - hives or itchy/watery eyes Hematological and Lymphatic ROS: negative for - bleeding problems, bruising or swollen lymph nodes Endocrine ROS:  negative for - galactorrhea, hair pattern changes, polydipsia/polyuria or temperature intolerance Respiratory ROS: negative for - cough, hemoptysis, shortness of breath or wheezing Cardiovascular ROS: negative for - chest pain, dyspnea on exertion, edema or irregular heartbeat Gastrointestinal ROS: negative for - abdominal pain, diarrhea, hematemesis, nausea/vomiting or stool incontinence Genito-Urinary ROS: negative for - dysuria, hematuria, incontinence or urinary frequency/urgency Musculoskeletal ROS: negative for - joint swelling or muscular weakness Neurological ROS: as noted in HPI Dermatological ROS: negative for rash and skin lesion changes  Physical Examination: Blood pressure 131/54, pulse 88, temperature 98.4 F (36.9 C), temperature source Oral, resp. rate 16, SpO2 99.00%.  Neurologic Examination: Mental Status: Alert, oriented, thought content appropriate.  Speech fluent without evidence of aphasia. Able to follow commands without difficulty. Cranial Nerves: II-Visual fields were normal. III/IV/VI-Pupils were equal and reacted. Extraocular movements were full and conjugate.    V/VII-slightly reduced perception of tactile sensation of left side of her face compared to the right; no facial weakness. VIII-normal. X-normal speech and symmetrical palatal movement. Motor: 5/5 bilaterally with normal tone and bulk Sensory: Reduced perception of tactile sensation over left extremities compared to right extremities. Deep Tendon Reflexes: 1+ and symmetric. Plantars: Mute bilaterally Cerebellar: Normal finger-to-nose testing. Carotid auscultation: Normal  Mr Brain Wo Contrast  07/19/2013   CLINICAL DATA:  Weakness and confusion over the past week. Dialysis patient.  EXAM: MRI HEAD WITHOUT CONTRAST  TECHNIQUE: Multiplanar, multiecho pulse sequences of the brain and surrounding structures were obtained without intravenous contrast.  COMPARISON:  06/25/2013 head CT.  11/15/2009  brain MR.   FINDINGS: Prominent small vessel disease type changes (progressive since 2011).  Scattered acute/ subacute infarcts throughout the supratentorial region bilaterally. Two of these tiny infarcts appear to be acute within the right frontal lobe and 1 infarct appears to be acute within the left frontal lobe whereas other infarcts appear subacute.  No intracranial hemorrhage.  Mild atrophy without hydrocephalus.  No intracranial mass lesion noted on this unenhanced exam.  Major intracranial vascular structures appear patent.  Paranasal sinus mucosal thickening with partial opacification left sphenoid sinus air cell.  Exophthalmos.  Decreased signal intensity of bone marrow upper cervical spine may be related to anemia from dialysis.  IMPRESSION: Prominent small vessel disease type changes (progressive since 2011).  Scattered acute/ subacute infarcts throughout the supratentorial region bilaterally. Two of these tiny infarcts appear to be acute within the right frontal lobe and 1 infarct appears to be acute within the left frontal lobe whereas other infarcts appear subacute.  No intracranial hemorrhage.  Paranasal sinus mucosal thickening with partial opacification left sphenoid sinus air cell.  These results were called by telephone at the time of interpretation on 07/19/2013 at 6:55 PM to Dr. [name] , who verbally acknowledged these results.   Electronically Signed   By: Chauncey Cruel M.D.   On: 07/19/2013 19:02    Assessment: 73 y.o. 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  Stroke Risk Factors - diabetes mellitus and hypertension  Plan: 1. HgbA1c, fasting lipid panel 2. MRA  of the brain without contrast 3. PT consult, OT consult, Speech consult 4. Echocardiogram 5. Carotid dopplers 6. Prophylactic therapy-Antiplatelet med: Aspirin  7. Risk factor modification 8. Telemetry monitoring   C.R. Nicole Kindred, MD [facility] [phone]  07/20/2013, 10:18 AM

## 2013-07-20 NOTE — ED Provider Notes (Signed)
CSN: [id number]     Arrival date & time 07/20/13  0819 History   First MD Initiated Contact with Patient 07/20/13 [phone]     Chief Complaint  Patient presents with  . Abdominal Pain   (Consider location/radiation/quality/duration/timing/severity/associated sxs/prior Treatment) HPI Comments: Patient is a 73 year old female with a history of diabetes mellitus, hypertension, and chronic kidney disease (on M/W/F dialysis) who presents today for abdominal pain x 3 days. Patient states that pain is intermittent and waxing and waning in severity; sharp and stabbing in nature. Pain located in her periumbilical and supraumbilical region and nonradiating. Patient states pain is worse "when I get my shots"; she was recently switched to Humulin which is injected sub-Q to her abdomen though she also has pain independent of these shots. Patient and family deny fevers, CP, SOB, diarrhea, melena, hematochezia, nausea, hematochezia, and urinary symptoms. Denies a hx of abdominal surgeries. Last BM 48 hours ago; normal without blood.  Per family, patient had outpatient MRI completed yesterday as she has done having more difficulty ambulating since her admission one month ago secondary to "leg weakness". Family also notes intermittent confusion. They state her MRI was positive for stroke. Patient A&Ox4 at present.  Patient is a 73 y.o. female presenting with abdominal pain. The history is provided by the patient. No language interpreter was used.  Abdominal Pain Associated symptoms: vomiting (NB/NB x 1 yesterday)   Associated symptoms: no chest pain, no constipation, no diarrhea, no dysuria, no fever, no hematuria, no nausea and no shortness of breath     Past Medical History  Diagnosis Date  . Hypertension   . Cataract   . Diabetes mellitus   . Chronic kidney disease   . Shortness of breath   . Anemia    Past Surgical History  Procedure Laterality Date  . No past surgeries    . Cataract extraction w/phaco   11/20/2011    Procedure: CATARACT EXTRACTION PHACO AND INTRAOCULAR LENS PLACEMENT (IOC);  Surgeon: Tonny Branch, MD;  Location: [facility];  Service: Ophthalmology;  Laterality: Right;  CDE 18.82  . Cataract extraction w/phaco Left 11/18/2012    Procedure: CATARACT EXTRACTION PHACO AND INTRAOCULAR LENS PLACEMENT (IOC);  Surgeon: Tonny Branch, MD;  Location: [facility];  Service: Ophthalmology;  Laterality: Left;  CDE: 18.97  . Colonoscopy N/A 02/09/2013    Procedure: COLONOSCOPY;  Surgeon: Rogene Houston, MD;  Location: [facility];  Service: Endoscopy;  Laterality: N/A;  305-moved to 220 Ann to notify pt  . Insertion of dialysis catheter Right 06/24/2013    Procedure: INSERTION OF DIALYSIS CATHETER: Ultrasound guided;  Surgeon: Serafina Mitchell, MD;  Location: [facility];  Service: Vascular;  Laterality: Right;   Family History  Problem Relation Age of Onset  . Anesthesia problems Neg Hx   . Hypotension Neg Hx   . Malignant hyperthermia Neg Hx   . Pseudochol deficiency Neg Hx   . Cancer Sister   . Cancer Brother    History  Substance Use Topics  . Smoking status: Never Smoker   . Smokeless tobacco: Never Used  . Alcohol Use: No   OB History   Grav Para Term Preterm Abortions TAB SAB Ect Mult Living                 Review of Systems  Constitutional: Negative for fever.  Respiratory: Negative for shortness of breath.   Cardiovascular: Negative for chest pain.  Gastrointestinal: Positive for vomiting (NB/NB x 1 yesterday) and abdominal pain.  Negative for nausea, diarrhea, constipation and blood in stool.  Genitourinary: Negative for dysuria and hematuria.  All other systems reviewed and are negative.    Allergies  Ambien and Reglan  Home Medications   Current Outpatient Rx  Name  Route  Sig  Dispense  Refill  . acetaminophen (TYLENOL) 325 MG tablet   Oral   Take 650 mg by mouth every 6 (six) hours as needed (pain).         Marland Kitchen aspirin EC 81 MG EC tablet   Oral   Take 1 tablet (81 mg  total) by mouth daily.         . calcium carbonate (TUMS - DOSED IN MG ELEMENTAL CALCIUM) 500 MG chewable tablet   Oral   Chew 1 tablet (200 mg of elemental calcium total) by mouth 3 (three) times daily with meals.         . Cyanocobalamin (VITAMIN B-12 IJ)   Injection   Inject 1 application as directed every 30 (thirty) days.         . insulin NPH Human (HUMULIN N,NOVOLIN N) 100 UNIT/ML injection   Subcutaneous   Inject 10 Units into the skin 2 (two) times daily at 8 am and 10 pm.   10 mL   1   . labetalol (NORMODYNE) 200 MG tablet   Oral   Take 200 mg by mouth 2 (two) times daily.         . minoxidil (LONITEN) 2.5 MG tablet   Oral   Take 2 tablets (5 mg total) by mouth 2 (two) times daily.   60 tablet   0   . multivitamin (RENA-VIT) TABS tablet   Oral   Take 1 tablet by mouth at bedtime.   30 tablet   0   . pantoprazole (PROTONIX) 40 MG tablet   Oral   Take 1 tablet (40 mg total) by mouth daily with supper.   30 tablet   0   . sodium chloride 0.9 % SOLN 100 mL with ferric gluconate 12.5 MG/ML SOLN 125 mg   Intravenous   Inject 125 mg into the vein every Monday, Wednesday, and Friday with hemodialysis.          BP 115/58  Pulse 88  Temp(Src) 98.7 F (37.1 C) (Oral)  Resp 17  SpO2 100%  Physical Exam  Nursing note and vitals reviewed. Constitutional: She is oriented to person, place, and time. She appears well-developed and well-nourished. No distress.  HENT:  Head: Normocephalic and atraumatic.  Mouth/Throat: Oropharynx is clear and moist. No oropharyngeal exudate.  Eyes: Conjunctivae and EOM are normal. Pupils are equal, round, and reactive to light. No scleral icterus.  Pupils equal round and reactive to direct and consensual light b/l.  Neck: Normal range of motion. Neck supple.  Cardiovascular: Normal rate and regular rhythm.   Murmur (II/VI SEM) heard. Pulmonary/Chest: Effort normal and breath sounds normal. No respiratory distress. She has  no wheezes. She has no rales.  Abdominal: Soft. She exhibits distension (mild). There is no tenderness. There is no rebound and no guarding.  No peritoneal signs or guarding  Musculoskeletal: Normal range of motion.  Neurological: She is alert and oriented to person, place, and time. No cranial nerve deficit.   GCS 15. Patient speaks in full goal oriented sentences. Cranial nerves grossly intact; equal tongue protrusion, symmetric eyebrow raise, no facial drooping, normal shoulder shrug. Patient has equal grip strength bilaterally with normal strength against resistance in all extremities.  She moves extremities without ataxia. No gross sensory deficits appreciated. Patellar and achilles reflexes 2+ b/l.  Skin: Skin is warm and dry. No rash noted. She is not diaphoretic. No erythema. No pallor.  Psychiatric: She has a normal mood and affect. Her behavior is normal.    ED Course  Procedures (including critical care time) Labs Review Labs Reviewed  CBC WITH DIFFERENTIAL - Abnormal; Notable for the following:    WBC 12.3 (*)    RBC 2.72 (*)    Hemoglobin 7.9 (*)    HCT 23.4 (*)    Neutro Abs 9.2 (*)    Lymphocytes Relative 11 (*)    Monocytes Relative 14 (*)    Monocytes Absolute 1.7 (*)    All other components within normal limits  COMPREHENSIVE METABOLIC PANEL - Abnormal; Notable for the following:    Glucose, Bld 140 (*)    BUN 35 (*)    Creatinine, Ser 6.43 (*)    Calcium 8.3 (*)    Albumin 2.9 (*)    GFR calc non Af Amer 6 (*)    GFR calc Af Amer 7 (*)    All other components within normal limits  URINALYSIS, ROUTINE W REFLEX MICROSCOPIC - Abnormal; Notable for the following:    APPearance CLOUDY (*)    Bilirubin Urine SMALL (*)    Ketones, ur 15 (*)    Protein, ur 100 (*)    Leukocytes, UA SMALL (*)    All other components within normal limits  GLUCOSE, CAPILLARY - Abnormal; Notable for the following:    Glucose-Capillary 120 (*)    All other components within normal  limits  URINE MICROSCOPIC-ADD ON - Abnormal; Notable for the following:    Casts GRANULAR CAST (*)    All other components within normal limits  CG4 I-STAT (LACTIC ACID) - Abnormal; Notable for the following:    Lactic Acid, Venous 0.34 (*)    All other components within normal limits  LIPASE, BLOOD  OCCULT BLOOD X 1 CARD TO LAB, STOOL  OCCULT BLOOD, POC DEVICE   Imaging Review Mr Brain Wo Contrast  07/19/2013   CLINICAL DATA:  Weakness and confusion over the past week. Dialysis patient.  EXAM: MRI HEAD WITHOUT CONTRAST  TECHNIQUE: Multiplanar, multiecho pulse sequences of the brain and surrounding structures were obtained without intravenous contrast.  COMPARISON:  06/25/2013 head CT.  11/15/2009 brain MR.  FINDINGS: Prominent small vessel disease type changes (progressive since 2011).  Scattered acute/ subacute infarcts throughout the supratentorial region bilaterally. Two of these tiny infarcts appear to be acute within the right frontal lobe and 1 infarct appears to be acute within the left frontal lobe whereas other infarcts appear subacute.  No intracranial hemorrhage.  Mild atrophy without hydrocephalus.  No intracranial mass lesion noted on this unenhanced exam.  Major intracranial vascular structures appear patent.  Paranasal sinus mucosal thickening with partial opacification left sphenoid sinus air cell.  Exophthalmos.  Decreased signal intensity of bone marrow upper cervical spine may be related to anemia from dialysis.  IMPRESSION: Prominent small vessel disease type changes (progressive since 2011).  Scattered acute/ subacute infarcts throughout the supratentorial region bilaterally. Two of these tiny infarcts appear to be acute within the right frontal lobe and 1 infarct appears to be acute within the left frontal lobe whereas other infarcts appear subacute.  No intracranial hemorrhage.  Paranasal sinus mucosal thickening with partial opacification left sphenoid sinus air cell.  These  results were called by telephone at the  time of interpretation on 07/19/2013 at 6:55 PM to Dr. [name] , who verbally acknowledged these results.   Electronically Signed   By: Chauncey Cruel M.D.   On: 07/19/2013 19:02    EKG Interpretation    Date/Time:  Wednesday July 20 2013 09:44:53 EST Ventricular Rate:  89 PR Interval:  138 QRS Duration: 74 QT Interval:  342 QTC Calculation: 416 R Axis:   40 Text Interpretation:  Sinus rhythm Borderline low voltage, extremity leads No significant change since last tracing Confirmed by Christy Gentles  MD, [facility] [phone]) on 07/20/2013 9:57:18 AM            MDM   1. 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 Unspecified essential hypertension   4. Abdominal pain    73 year old female presents for abdominal pain. She is well and nontoxic appearing and hemodynamically stable. Abdominal reexaminations have been stable without significant focal tenderness or peritoneal signs. Labs appear to be at baseline. There is a slight drop in patient's hemoglobin, but stool is Hemoccult negative. Liver function preserved and kidney function appears at baseline for this patient who was dialyzed M/W/F; due for dialysis today. I see no indication for emergent abdominal imaging given reassuring physical exam and laboratory work up.  As an aside the patient had outpatient MRI done for worsening lower extremity weakness since prior admission one month ago. MR showed acute left frontal lobe infarct. Dr. Nicole Kindred of neurology has been consulted and evaluated the patient. He believes patient warrants inpatient stroke workup. Have consulted with Dr. Hartford Poli of [facility] who will admit. Temp orders placed.   Filed Vitals:   07/20/13 0831 07/20/13 1000 07/20/13 1145  BP: 131/54 115/58   Pulse: 88 88   Temp: 98.4 F (36.9 C)  98.7 F (37.1 C)  TempSrc: Oral    Resp: 16 17   SpO2: 99% 100%        Antonietta Breach, PA-C 07/20/13 [place], PA-C 07/20/13 1203

## 2013-07-20 NOTE — Consult Note (Signed)
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  Also has DM and HTN.  CVA workup underway.  No HD today.  No recent problems with her HD treatments.     Filed Weights   07/20/13 1440  Weight: 75.8 kg (167 lb 1.7 oz)   ROS Balance of 12 systems is negative w/ exceptions as above  Outpt HD Orders Unit: [name] Days: MWF Time: unk Dialyzer: unk EDW: unk K/Ca: unk Access: unk Needle Size: unk BFR: unk UF Proflie: unk VDRA: unk EPO: unk IV Fe: unk Heparin: unk  PMH  Past Medical History  Diagnosis Date  . Hypertension   . Cataract   . Diabetes mellitus   . Chronic kidney disease   . Shortness of breath   . Anemia    PSH  Past Surgical History  Procedure Laterality Date  . No past surgeries    . Cataract extraction w/phaco  11/20/2011    Procedure: CATARACT EXTRACTION PHACO AND INTRAOCULAR LENS PLACEMENT (IOC);  Surgeon: Barbara Branch, MD;  Location: [name];  Service: Ophthalmology;  Laterality: Right;  CDE 18.82  . Cataract extraction w/phaco Left 11/18/2012    Procedure: CATARACT EXTRACTION PHACO AND INTRAOCULAR LENS PLACEMENT (IOC);  Surgeon: Barbara Branch, MD;  Location: [name];  Service: Ophthalmology;  Laterality: Left;  CDE: 18.97  . Colonoscopy N/A 02/09/2013    Procedure: COLONOSCOPY;  Surgeon: Barbara Houston, MD;  Location: [name];  Service: Endoscopy;  Laterality: N/A;  305-moved to 220 Ann to notify pt  . Insertion of dialysis catheter Right 06/24/2013    Procedure: INSERTION OF DIALYSIS CATHETER: Ultrasound guided;  Surgeon: Barbara Mitchell, MD;  Location: [name];  Service: Vascular;  Laterality: Right;   FH  Family History  Problem Relation Age of Onset  . Anesthesia problems Neg Hx   . Hypotension Neg Hx   . Malignant hyperthermia Neg Hx   . Pseudochol deficiency Neg Hx   .  Cancer Sister   . Cancer Brother    SH  reports that she has never smoked. She has never used smokeless tobacco. She reports that she does not drink alcohol [facility] use illicit drugs. Allergies  Allergies  Allergen Reactions  . Ambien [Zolpidem] Other (See Comments)    Hallucinations   . Reglan [Metoclopramide] Other (See Comments)    Hallucinations    Home medications Prior to Admission medications   Medication Sig Start Date End Date Taking? Authorizing Provider  acetaminophen (TYLENOL) 325 MG tablet Take 650 mg by mouth every 6 (six) hours as needed (pain).   Yes Historical Provider, MD  aspirin EC 81 MG EC tablet Take 1 tablet (81 mg total) by mouth daily. 07/03/13  Yes Barbara Odea, MD  calcium carbonate (TUMS - DOSED IN MG ELEMENTAL CALCIUM) 500 MG chewable tablet Chew 1 tablet (200 mg of elemental calcium total) by mouth 3 (three) times daily with meals. 07/04/13  Yes Barbara Altes, MD  Cyanocobalamin (VITAMIN B-12 IJ) Inject 1 application as directed every 30 (thirty) days.   Yes Historical Provider, MD  insulin NPH Human (HUMULIN N,NOVOLIN N) 100 UNIT/ML injection Inject 10 Units into the skin 2 (two) times daily at 8 am and 10 pm. 07/04/13  Yes Barbara Altes, MD  labetalol (NORMODYNE) 200 MG tablet Take 200 mg by  mouth 2 (two) times daily.   Yes Historical Provider, MD  minoxidil (LONITEN) 2.5 MG tablet Take 2 tablets (5 mg total) by mouth 2 (two) times daily. 07/04/13  Yes Barbara Altes, MD  multivitamin (RENA-VIT) TABS tablet Take 1 tablet by mouth at bedtime. 07/04/13  Yes Barbara Altes, MD  pantoprazole (PROTONIX) 40 MG tablet Take 1 tablet (40 mg total) by mouth daily with supper. 07/04/13  Yes Barbara Altes, MD  sodium chloride 0.9 % SOLN 100 mL with ferric gluconate 12.5 MG/ML SOLN 125 mg Inject 125 mg into the vein every Monday, Wednesday, and Friday with hemodialysis. 07/04/13  Yes Barbara Altes, MD    Current Medications Current Facility-Administered  Medications  Medication Dose Route Frequency Provider Last Rate Last Dose  . acetaminophen (TYLENOL) tablet 650 mg  650 mg Oral Q4H PRN Barbara Monte, MD       [facility]  . acetaminophen (TYLENOL) suppository 650 mg  650 mg Rectal Q4H PRN Barbara Monte, MD      . acetaminophen (TYLENOL) tablet 650 mg  650 mg Oral Q6H PRN Barbara Monte, MD      . aspirin suppository 300 mg  300 mg Rectal Daily Barbara Monte, MD       [facility]  . aspirin tablet 325 mg  325 mg Oral Daily Barbara Monte, MD      . calcium carbonate (TUMS - dosed in mg elemental calcium) chewable tablet 200 mg of elemental calcium  1 tablet Oral TID WC Barbara Monte, MD      . [name] ON 07/22/2013] ferric gluconate (NULECIT) 125 mg in sodium chloride 0.9 % 100 mL IVPB  125 mg Intravenous Q M,W,F-HD Barbara Monte, MD      . heparin injection 5,000 Units  5,000 Units Subcutaneous Q8H Barbara Monte, MD      . HYDROcodone-acetaminophen (NORCO/VICODIN) 5-325 MG per tablet 1-2 tablet  1-2 tablet Oral Q4H PRN Barbara Monte, MD      . insulin NPH Human (HUMULIN N,NOVOLIN N) injection 5 Units  5 Units Subcutaneous BID AC & HS Barbara Elmahi, MD      . labetalol (NORMODYNE) tablet 200 mg  200 mg Oral BID Barbara Monte, MD      . minoxidil (LONITEN) tablet 5 mg  5 mg Oral BID Barbara Monte, MD      . morphine 2 MG/ML injection 1-2 mg  1-2 mg Intravenous Q4H PRN Barbara Monte, MD      . multivitamin (RENA-VIT) tablet 1 tablet  1 tablet Oral QHS Barbara Monte, MD      . pantoprazole (PROTONIX) EC tablet 40 mg  40 mg Oral Q supper Barbara Monte, MD         CBC  Recent Labs Lab 07/20/13 0910  WBC 12.3*  NEUTROABS 9.2*  HGB 7.9*  HCT 23.4*  MCV 86.0  PLT [id number]   Basic Metabolic Panel  Recent Labs Lab 07/20/13 0910  NA 138  K 4.7  CL 99  CO2 25  GLUCOSE 140*  BUN 35*  CREATININE 6.43*  CALCIUM 8.3*    Physical Exam  Blood pressure 137/57, pulse 91, temperature 98.7 F (37.1 C), temperature source Oral, resp. rate 16, height 5\' 7"  (1.702 m), weight 75.8 kg  (167 lb 1.7 oz), SpO2 100.00%. GEN: NAD, elderly female ENT: NCAT, poor dentition EYES: EOMI CV: RRR, nl s1s2 no rub PULM: CTAB, no crackles ABD: some TTP in LLQ.  No R/G.  NABS SKIN: no rashes/lesions.  [place]  on R IJ CDI bandage EXT:No LEE   A/P 1. ESRD: HD tomorrow and then return to MWF schedule. Outpt Tx records req 2. HTN/Vol: stable BP currently.  Keep EDW for now once obtained 3. Anemia: cont outpt ESA/Fe dosing 4. MBD: Obtain outpt records.  On Tums 5. CVA: per neurology  [name] MD 07/20/2013, 3:20 PM

## 2013-07-20 NOTE — H&P (Signed)
[facility] History and Physical  [name] [id number] DOB: 1941-04-06 DOA: 07/20/2013  Referring physician: Antonietta Breach, PA-C PCP: Barbara Font, MD   Chief Complaint: Stroke  HPI: [name] is a 73 y.o. female with past medical history of end-stage renal disease recently started on dialysis, hypertension and diabetes mellitus type 2. Patient into the hospital because of a findings on MRI done yesterday as outpatient. Per patient and family who was at bedside, patient was having confusion and generalized weakness recently, she was admitted to the hospital about a month ago secondary to seizures which was believed to that time to be secondary to metabolic derangement from starting on dialysis and severe hypoglycemia. MRI was done yesterday as outpatient showed bilateral multiple acute/subacute strokes. Patient asked by her PCP to come to the emergency department for further evaluation. In the ED blood work looks okay, urinalysis has cloudy appearance and 7-10 pus cells. Patient will be admitted to the hospital for further evaluation.  Review of Systems:  Constitutional: negative for anorexia, fevers and sweats Eyes: negative for irritation, redness and visual disturbance Ears, nose, mouth, throat, and face: negative for earaches, epistaxis, nasal congestion and sore throat Respiratory: negative for cough, dyspnea on exertion, sputum and wheezing Cardiovascular: negative for chest pain, dyspnea, lower extremity edema, orthopnea, palpitations and syncope Gastrointestinal: negative for abdominal pain, constipation, diarrhea, melena, nausea and vomiting Genitourinary:negative for dysuria, frequency and hematuria Hematologic/lymphatic: negative for bleeding, easy bruising and lymphadenopathy Musculoskeletal:negative for arthralgias, muscle weakness and stiff joints Neurological: negative for coordination problems, gait problems, headaches and weakness Endocrine: negative  for diabetic symptoms including polydipsia, polyuria and weight loss Allergic/Immunologic: negative for anaphylaxis, hay fever and urticaria  Past Medical History  Diagnosis Date  . Hypertension   . Cataract   . Diabetes mellitus   . Chronic kidney disease   . Shortness of breath   . Anemia    Past Surgical History  Procedure Laterality Date  . No past surgeries    . Cataract extraction w/phaco  11/20/2011    Procedure: CATARACT EXTRACTION PHACO AND INTRAOCULAR LENS PLACEMENT (IOC);  Surgeon: Tonny Branch, MD;  Location: [facility];  Service: Ophthalmology;  Laterality: Right;  CDE 18.82  . Cataract extraction w/phaco Left 11/18/2012    Procedure: CATARACT EXTRACTION PHACO AND INTRAOCULAR LENS PLACEMENT (IOC);  Surgeon: Tonny Branch, MD;  Location: [facility];  Service: Ophthalmology;  Laterality: Left;  CDE: 18.97  . Colonoscopy N/A 02/09/2013    Procedure: COLONOSCOPY;  Surgeon: Rogene Houston, MD;  Location: [facility];  Service: Endoscopy;  Laterality: N/A;  305-moved to 220 Ann to notify pt  . Insertion of dialysis catheter Right 06/24/2013    Procedure: INSERTION OF DIALYSIS CATHETER: Ultrasound guided;  Surgeon: Serafina Mitchell, MD;  Location: [facility];  Service: Vascular;  Laterality: Right;   Social History:  reports that she has never smoked. She has never used smokeless tobacco. She reports that she does not drink alcohol or use illicit drugs.  Allergies  Allergen Reactions  . Ambien [Zolpidem] Other (See Comments)    Hallucinations   . Reglan [Metoclopramide] Other (See Comments)    Hallucinations     Family History  Problem Relation Age of Onset  . Anesthesia problems Neg Hx   . Hypotension Neg Hx   . Malignant hyperthermia Neg Hx   . Pseudochol deficiency Neg Hx   . Cancer Sister   . Cancer Brother      Prior to Admission medications  Medication Sig Start Date End Date Taking? Authorizing Provider  acetaminophen (TYLENOL) 325 MG tablet Take 650 mg by mouth every 6 (six)  hours as needed (pain).   Yes Historical Provider, MD  aspirin EC 81 MG EC tablet Take 1 tablet (81 mg total) by mouth daily. 07/03/13  Yes Debbe Odea, MD  calcium carbonate (TUMS - DOSED IN MG ELEMENTAL CALCIUM) 500 MG chewable tablet Chew 1 tablet (200 mg of elemental calcium total) by mouth 3 (three) times daily with meals. 07/04/13  Yes Cherene Altes, MD  Cyanocobalamin (VITAMIN B-12 IJ) Inject 1 application as directed every 30 (thirty) days.   Yes Historical Provider, MD  insulin NPH Human (HUMULIN N,NOVOLIN N) 100 UNIT/ML injection Inject 10 Units into the skin 2 (two) times daily at 8 am and 10 pm. 07/04/13  Yes Cherene Altes, MD  labetalol (NORMODYNE) 200 MG tablet Take 200 mg by mouth 2 (two) times daily.   Yes Historical Provider, MD  minoxidil (LONITEN) 2.5 MG tablet Take 2 tablets (5 mg total) by mouth 2 (two) times daily. 07/04/13  Yes Cherene Altes, MD  multivitamin (RENA-VIT) TABS tablet Take 1 tablet by mouth at bedtime. 07/04/13  Yes Cherene Altes, MD  pantoprazole (PROTONIX) 40 MG tablet Take 1 tablet (40 mg total) by mouth daily with supper. 07/04/13  Yes Cherene Altes, MD  sodium chloride 0.9 % SOLN 100 mL with ferric gluconate 12.5 MG/ML SOLN 125 mg Inject 125 mg into the vein every Monday, Wednesday, and Friday with hemodialysis. 07/04/13  Yes Cherene Altes, MD   Physical Exam: Filed Vitals:   07/20/13 1440  BP: 137/57  Pulse: 91  Temp: 98.7 F (37.1 C)  Resp: 16    BP 137/57  Pulse 91  Temp(Src) 98.7 F (37.1 C) (Oral)  Resp 16  Ht 5\' 7"  (1.702 m)  Wt 75.8 kg (167 lb 1.7 oz)  BMI 26.17 kg/m2  SpO2 100%  General:  Appears calm and comfortable Eyes: PERRL, normal lids, irises & conjunctiva ENT: grossly normal hearing, lips & tongue Neck: no LAD, masses or thyromegaly Cardiovascular: RRR, no m/r/g. No LE edema. Telemetry: SR, no arrhythmias  Respiratory: CTA bilaterally, no w/r/r. Normal respiratory effort. Abdomen: soft, ntnd Skin: no  rash or induration seen on limited exam Musculoskeletal: grossly normal tone BUE/BLE Psychiatric: grossly normal mood and affect, speech fluent and appropriate Neurologic: grossly non-focal.       Labs on Admission:  Basic Metabolic Panel:  Recent Labs Lab 07/20/13 0910  NA 138  K 4.7  CL 99  CO2 25  GLUCOSE 140*  BUN 35*  CREATININE 6.43*  CALCIUM 8.3*   Liver Function Tests:  Recent Labs Lab 07/20/13 0910  AST 16  ALT 16  ALKPHOS 88  BILITOT 0.4  PROT 7.1  ALBUMIN 2.9*    Recent Labs Lab 07/20/13 0910  LIPASE 29   No results found for this basename: AMMONIA,  in the last 168 hours CBC:  Recent Labs Lab 07/20/13 0910  WBC 12.3*  NEUTROABS 9.2*  HGB 7.9*  HCT 23.4*  MCV 86.0  PLT 194   Cardiac Enzymes: No results found for this basename: CKTOTAL, CKMB, CKMBINDEX, TROPONINI,  in the last 168 hours  BNP (last 3 results) No results found for this basename: PROBNP,  in the last 8760 hours CBG:  Recent Labs Lab 07/20/13 0907  GLUCAP 120*    Radiological Exams on Admission: Mr Brain Wo Contrast  07/19/2013  CLINICAL DATA:  Weakness and confusion over the past week. Dialysis patient.  EXAM: MRI HEAD WITHOUT CONTRAST  TECHNIQUE: Multiplanar, multiecho pulse sequences of the brain and surrounding structures were obtained without intravenous contrast.  COMPARISON:  06/25/2013 head CT.  11/15/2009 brain MR.  FINDINGS: Prominent small vessel disease type changes (progressive since 2011).  Scattered acute/ subacute infarcts throughout the supratentorial region bilaterally. Two of these tiny infarcts appear to be acute within the right frontal lobe and 1 infarct appears to be acute within the left frontal lobe whereas other infarcts appear subacute.  No intracranial hemorrhage.  Mild atrophy without hydrocephalus.  No intracranial mass lesion noted on this unenhanced exam.  Major intracranial vascular structures appear patent.  Paranasal sinus mucosal thickening  with partial opacification left sphenoid sinus air cell.  Exophthalmos.  Decreased signal intensity of bone marrow upper cervical spine may be related to anemia from dialysis.  IMPRESSION: Prominent small vessel disease type changes (progressive since 2011).  Scattered acute/ subacute infarcts throughout the supratentorial region bilaterally. Two of these tiny infarcts appear to be acute within the right frontal lobe and 1 infarct appears to be acute within the left frontal lobe whereas other infarcts appear subacute.  No intracranial hemorrhage.  Paranasal sinus mucosal thickening with partial opacification left sphenoid sinus air cell.  These results were called by telephone at the time of interpretation on 07/19/2013 at 6:55 PM to Dr. [name] , who verbally acknowledged these results.   Electronically Signed   By: Chauncey Cruel M.D.   On: 07/19/2013 19:02    EKG: Independently reviewed.  Assessment/Plan Active Problems:   DIABETES MELLITUS, TYPE II   HYPERTENSION   End stage renal disease   CVA (cerebral infarction)   Stroke -MRI showed acute/subacute bilateral CVA. -Patient does have ESRD, hypertension and diabetes mellitus type 2. -No history of arrhythmias, patient is on low-dose aspirin, increase to full dose of aspirin. -Patient will proceed with a PT/OT and SLP. -Check hemoglobin A1c, telemetry.  Diabetes mellitus type 2 -Patient is on NPH insulin, give half of the dose while she is in the hospital, we might need to adjust that in the morning. -Insulin sliding scale, diabetic diet. -Patient has history of gastroparesis, Reglan as needed.  ESRD -Nephrology to evaluate, getting dialysis in [facility] on Monday, Wednesday and Friday.  Hypertension -Blood pressure looks very okay, she is on different blood pressure medications at home. -Continue home blood pressure medications, nephrology please advise  History of sundowning -Family patient has history of severe confusion at  night, I will not be surprised if she has same symptoms tonight.  History of grade 2 diastolic dysfunction -With lower extremity edema, blood pressure is controlled, patient for dialysis for volume control.  Questionable UTI -Urinalysis showed questionable UTI, patient will be on Rocephin.  Code Status: Full code Family Communication: Plan discussed with the patient in the presence of her daughter at bedside. Disposition Plan: Admission to inpatient, telemetry  Time spent: 70 minutes  [facility] Pager [phone]

## 2013-07-20 NOTE — ED Notes (Signed)
Pt reports abd pain that started about 3 days ago. Denies any n/v. States that she was also called from PCP and told that from her MRI they found some places that needed follow up with a neurologist. Pt also a dialysis pt. MWF for dialysis.

## 2013-07-20 NOTE — ED Notes (Signed)
Neuro at the bedside.

## 2013-07-20 NOTE — ED Notes (Signed)
Admitting Physician at the bedside.  

## 2013-07-20 NOTE — Progress Notes (Signed)
VASCULAR LAB PRELIMINARY  PRELIMINARY  PRELIMINARY  PRELIMINARY  Carotid duplex  completed.    Preliminary report: Mild plaque noted at the proximal internal carotid arteries bilaterally.  No ICA stenosis noted.  Vertebral artery flow antegrade.  Jorgia Manthei, RVT 07/20/2013, 5:25 PM

## 2013-07-20 NOTE — Progress Notes (Signed)
Request received for TEE. Scheduled for TEE for tomorrow with Dr. Meda Coffee at [facility]. Orders written. Primary team had already made her NPO after midnight. Dayna Dunn PA-C

## 2013-07-20 NOTE — Progress Notes (Signed)
SLP Cancellation Note  Patient Details Name: [name] MRN: [id number] DOB: Dec 25, 1940   Cancelled treatment:        Attempted to see patient for evaluation.  Patient is current gone for testing in Radiology. Will return 2/5.   Quinn Axe T 07/20/2013, 4:41 PM

## 2013-07-20 NOTE — ED Provider Notes (Signed)
Medical screening examination/treatment/procedure(s) were conducted as a shared visit with non-physician practitioner(s) and myself.  I personally evaluated the patient during the encounter.      Sharyon Cable, MD 07/20/13 [phone]

## 2013-07-20 NOTE — ED Provider Notes (Signed)
Patient seen/examined in the Emergency Department in conjunction with Midlevel Provider [facility] Patient reports abd pain Exam : awake/alert, abd soft, no distress noted Plan: labs pending.  She would also like neuro evaluation as she may have stroke confirmed by inpatient MRI   EKG Interpretation    Date/Time:  Wednesday July 20 2013 09:44:53 EST Ventricular Rate:  89 PR Interval:  138 QRS Duration: 74 QT Interval:  342 QTC Calculation: 416 R Axis:   40 Text Interpretation:  Sinus rhythm Borderline low voltage, extremity leads No significant change since last tracing Confirmed by Christy Gentles  MD, [place] [id number]) on 07/20/2013 9:57:18 AM             Sharyon Cable, MD 07/20/13 [phone]

## 2013-07-20 NOTE — ED Notes (Signed)
Results of lactic acid shown to [employer], PA-C

## 2013-07-20 NOTE — ED Notes (Signed)
Patient made aware of need for Urine Sample. Unable to give one at this time.

## 2013-07-20 NOTE — ED Notes (Signed)
Patient reports intermittent sharp, stabbing upper generalized abdominal pain. Patient reports "it feels like needles all over my stomach." Family reports patient has a "new insulin that is being given in her stomach instead of her arm." Since then, family reports she has had this stomach pain throughout the day and night where she will call out in pain. Also, Family reports PCP ran an MRI that had "two spots that showed she had had a past stroke that looked questionable." The family wants for the patient to see a neurologist. Patient has a history of renal disease and is currently on MWF Dialysis.

## 2013-07-21 ENCOUNTER — Encounter (HOSPITAL_COMMUNITY): Payer: Self-pay

## 2013-07-21 ENCOUNTER — Encounter (HOSPITAL_COMMUNITY)
Admission: EM | Disposition: A | Discharge status: Home Health | Payer: Medicare Other | Source: Home / Self Care | Attending: Internal Medicine

## 2013-07-21 ENCOUNTER — Encounter (HOSPITAL_COMMUNITY)
Admission: EM | Disposition: A | Discharge status: Home Health | Payer: Self-pay | Source: Home / Self Care | Attending: Internal Medicine

## 2013-07-21 ENCOUNTER — Inpatient Hospital Stay (HOSPITAL_COMMUNITY): Payer: Medicare Other

## 2013-07-21 DIAGNOSIS — K3184 Gastroparesis: Secondary | ICD-10-CM

## 2013-07-21 DIAGNOSIS — I6350 Cerebral infarction due to unspecified occlusion or stenosis of unspecified cerebral artery: Secondary | ICD-10-CM

## 2013-07-21 DIAGNOSIS — I369 Nonrheumatic tricuspid valve disorder, unspecified: Secondary | ICD-10-CM

## 2013-07-21 DIAGNOSIS — E1149 Type 2 diabetes mellitus with other diabetic neurological complication: Secondary | ICD-10-CM

## 2013-07-21 HISTORY — PX: LOOP RECORDER IMPLANT: SHX5954

## 2013-07-21 HISTORY — PX: TEE WITHOUT CARDIOVERSION: SHX5443

## 2013-07-21 HISTORY — PX: LOOP RECORDER IMPLANT: SHX5477

## 2013-07-21 LAB — RENAL FUNCTION PANEL
ALBUMIN: 2.7 g/dL — AB (ref 3.5–5.2)
BUN: 47 mg/dL — ABNORMAL HIGH (ref 6–23)
CHLORIDE: 101 meq/L (ref 96–112)
CO2: 23 mEq/L (ref 19–32)
CREATININE: 8.08 mg/dL — AB (ref 0.50–1.10)
Calcium: 8.5 mg/dL (ref 8.4–10.5)
GFR calc Af Amer: 5 mL/min — ABNORMAL LOW (ref >90)
GFR, EST NON AFRICAN AMERICAN: 4 mL/min — AB (ref >90)
Glucose, Bld: 105 mg/dL — ABNORMAL HIGH (ref 70–99)
PHOSPHORUS: 3.9 mg/dL (ref 2.3–4.6)
Potassium: 5.3 mEq/L (ref 3.7–5.3)
Sodium: 139 mEq/L (ref 137–147)

## 2013-07-21 LAB — CBC
HCT: 23.4 % — ABNORMAL LOW (ref 36.0–46.0)
Hemoglobin: 7.8 g/dL — ABNORMAL LOW (ref 12.0–15.0)
MCH: 28.8 pg (ref 26.0–34.0)
MCHC: 33.3 g/dL (ref 30.0–36.0)
MCV: 86.3 fL (ref 78.0–100.0)
PLATELETS: 208 10*3/uL (ref 150–400)
RBC: 2.71 MIL/uL — ABNORMAL LOW (ref 3.87–5.11)
RDW: 15.4 % (ref 11.5–15.5)
WBC: 10.4 10*3/uL (ref 4.0–10.5)

## 2013-07-21 LAB — GLUCOSE, CAPILLARY
GLUCOSE-CAPILLARY: 100 mg/dL — AB (ref 70–99)
Glucose-Capillary: 95 mg/dL (ref 70–99)
Glucose-Capillary: 244 mg/dL — ABNORMAL HIGH (ref 70–99)

## 2013-07-21 LAB — LIPID PANEL
CHOLESTEROL: 116 mg/dL (ref 0–200)
HDL: 29 mg/dL — ABNORMAL LOW (ref >39)
LDL Cholesterol: 68 mg/dL (ref 0–99)
TRIGLYCERIDES: 97 mg/dL (ref <150)
Total CHOL/HDL Ratio: 4 RATIO
VLDL: 19 mg/dL (ref 0–40)

## 2013-07-21 LAB — HEMOGLOBIN A1C
Hgb A1c MFr Bld: 7.1 % — ABNORMAL HIGH (ref <5.7)
MEAN PLASMA GLUCOSE: 157 mg/dL — AB (ref <117)

## 2013-07-21 SURGERY — ECHOCARDIOGRAM, TRANSESOPHAGEAL
Anesthesia: Moderate Sedation

## 2013-07-21 SURGERY — LOOP RECORDER IMPLANT
Anesthesia: LOCAL

## 2013-07-21 MED ORDER — BUTAMBEN-TETRACAINE-BENZOCAINE 2-2-14 % EX AERO
INHALATION_SPRAY | CUTANEOUS | Status: DC | PRN
  Administered 2013-07-21: 2 via TOPICAL

## 2013-07-21 MED ORDER — ONDANSETRON HCL 4 MG/2ML IJ SOLN
INTRAMUSCULAR | Status: DC | PRN
  Administered 2013-07-21: 4 mg via INTRAVENOUS

## 2013-07-21 MED ORDER — HEPARIN SODIUM (PORCINE) 1000 UNIT/ML DIALYSIS: 1000.0000 [IU] | INTRAMUSCULAR | Status: DC | PRN

## 2013-07-21 MED ORDER — HEPARIN SODIUM (PORCINE) 1000 UNIT/ML DIALYSIS: 20.0000 [IU]/kg | INTRAMUSCULAR | Status: DC | PRN

## 2013-07-21 MED ORDER — ONDANSETRON HCL 4 MG/2ML IJ SOLN
INTRAMUSCULAR | Status: AC
  Filled 2013-07-21: qty 2

## 2013-07-21 MED ORDER — SODIUM CHLORIDE 0.9 % IV SOLN: INTRAVENOUS | Status: DC

## 2013-07-21 MED ORDER — SODIUM CHLORIDE 0.9 % IV SOLN
INTRAVENOUS | Status: DC
  Administered 2013-07-21: 08:00:00 via INTRAVENOUS

## 2013-07-21 MED ORDER — CHLORHEXIDINE GLUCONATE 4 % EX LIQD
60.0000 mL | Freq: Once | CUTANEOUS | Status: DC
  Filled 2013-07-21: qty 60

## 2013-07-21 MED ORDER — FENTANYL CITRATE 0.05 MG/ML IJ SOLN
INTRAMUSCULAR | Status: DC | PRN
  Administered 2013-07-21: 25 ug via INTRAVENOUS

## 2013-07-21 MED ORDER — LIDOCAINE-EPINEPHRINE 1 %-1:100000 IJ SOLN
INTRAMUSCULAR | Status: AC
  Filled 2013-07-21: qty 1

## 2013-07-21 MED ORDER — SODIUM CHLORIDE 0.9 % IV SOLN: 100.0000 mL | INTRAVENOUS | Status: DC | PRN

## 2013-07-21 MED ORDER — NEPRO/CARBSTEADY PO LIQD
237.0000 mL | ORAL | Status: DC | PRN
  Filled 2013-07-21: qty 237

## 2013-07-21 MED ORDER — MIDAZOLAM HCL 10 MG/2ML IJ SOLN
INTRAMUSCULAR | Status: DC | PRN
  Administered 2013-07-21: 2 mg via INTRAVENOUS
  Administered 2013-07-21: 1 mg via INTRAVENOUS

## 2013-07-21 MED ORDER — MIDAZOLAM HCL 5 MG/ML IJ SOLN
INTRAMUSCULAR | Status: AC
  Filled 2013-07-21: qty 2

## 2013-07-21 MED ORDER — FENTANYL CITRATE 0.05 MG/ML IJ SOLN
INTRAMUSCULAR | Status: AC
  Filled 2013-07-21: qty 2

## 2013-07-21 NOTE — Progress Notes (Addendum)
Stroke Team Progress Note  HISTORY [name] is an 73 y.o. female with a history of end-stage renal disease on hemodialysis, diabetes mellitus and hypertension, who was instructed by her PCP to come to the emergency room for further evaluation following an MRI study which showed findings consistent with acute/subacute stroke. Two small right frontal and one small left frontal lesion were noted. Family members have noted transient changes in mental status with confusion. She's also had difficulty with walking which appear to affect both lower extremities. She's had no changes in speech. She has not noted any upper extremity symptoms. NIH stroke score was 1. Time of onset of cerebral infarctions is unclear. Patient has been taking aspirin 81 mg per day. LSN: Unclear  Patient was not administerd TPA secondary to delay in arrival. She was admitted for further evaluation and treatment.  SUBJECTIVE Her family is at the bedside.  Only on HD for a few weeks.  Overall she feels her condition is stable.   OBJECTIVE Most recent Vital Signs: Filed Vitals:   07/21/13 1020 07/21/13 1030 07/21/13 1040 07/21/13 1131  BP: 124/55 114/45 117/48 138/59  Pulse: 85 85 85 82  Temp:    97.7 F (36.5 C)  TempSrc:    Oral  Resp: 18 13 16 18   Height:      Weight:      SpO2: 99% 97% 98% 98%   CBG (last 3)   Recent Labs  07/20/13 1752 07/20/13 2120 07/21/13 0640  GLUCAP 152* 159* 95    IV Fluid Intake:   . sodium chloride Stopped (07/21/13 1048)    MEDICATIONS  . aspirin  300 mg Rectal Daily   Or  . aspirin  325 mg Oral Daily  . calcium carbonate  1 tablet Oral TID WC  . cefTRIAXone (ROCEPHIN)  IV  1 g Intravenous Q24H  . [START ON 07/22/2013] ferric gluconate (FERRLECIT/NULECIT) IV  125 mg Intravenous Q M,W,F-HD  . heparin  5,000 Units Subcutaneous Q8H  . insulin aspart  0-15 Units Subcutaneous TID WC  . insulin NPH Human  5 Units Subcutaneous BID AC & HS  . labetalol  200 mg Oral BID  .  multivitamin  1 tablet Oral QHS  . pantoprazole  40 mg Oral Q supper   PRN:  acetaminophen, acetaminophen, acetaminophen, HYDROcodone-acetaminophen, morphine injection  Diet:  NPO  Activity:   DVT Prophylaxis:  Heparin 5000 units sq tid   CLINICALLY SIGNIFICANT STUDIES Basic Metabolic Panel:   Recent Labs Lab 07/20/13 0910  NA 138  K 4.7  CL 99  CO2 25  GLUCOSE 140*  BUN 35*  CREATININE 6.43*  CALCIUM 8.3*   Liver Function Tests:   Recent Labs Lab 07/20/13 0910  AST 16  ALT 16  ALKPHOS 88  BILITOT 0.4  PROT 7.1  ALBUMIN 2.9*   CBC:   Recent Labs Lab 07/20/13 0910  WBC 12.3*  NEUTROABS 9.2*  HGB 7.9*  HCT 23.4*  MCV 86.0  PLT 194   Coagulation: No results found for this basename: LABPROT, INR,  in the last 168 hours Cardiac Enzymes: No results found for this basename: CKTOTAL, CKMB, CKMBINDEX, TROPONINI,  in the last 168 hours Urinalysis:   Recent Labs Lab 07/20/13 1026  COLORURINE YELLOW  LABSPEC 1.024  PHURINE 5.0  GLUCOSEU NEGATIVE  HGBUR NEGATIVE  BILIRUBINUR SMALL*  KETONESUR 15*  PROTEINUR 100*  UROBILINOGEN 0.2  NITRITE NEGATIVE  LEUKOCYTESUR SMALL*   Lipid Panel    Component Value Date/Time  CHOL 116 07/21/2013 0720   TRIG 97 07/21/2013 0720   HDL 29* 07/21/2013 0720   CHOLHDL 4.0 07/21/2013 0720   VLDL 19 07/21/2013 0720   LDLCALC 68 07/21/2013 0720   HgbA1C  Lab Results  Component Value Date   HGBA1C  Value: 12.3 (NOTE) The ADA recommends the following therapeutic goal for glycemic control related to Hgb A1c measurement: Goal of therapy: <6.5 Hgb A1c  Reference: American Diabetes Association: Clinical Practice Recommendations 2010, Diabetes Care, 2010, 33: (Suppl  1).* 01/25/2009    Urine Drug Screen:   No results found for this basename: labopia,  cocainscrnur,  labbenz,  amphetmu,  thcu,  labbarb    Alcohol Level: No results found for this basename: ETH,  in the last 168 hours   CT of the brain    MRI of the brain  07/19/2013    Prominent small vessel disease type changes (progressive since 2011).  Scattered acute/ subacute infarcts throughout the supratentorial region bilaterally. Two of these tiny infarcts appear to be acute within the right frontal lobe and 1 infarct appears to be acute within the left frontal lobe whereas other infarcts appear subacute.  No intracranial hemorrhage.  Paranasal sinus mucosal thickening with partial opacification left sphenoid sinus air cell.    MRA of the brain  07/21/2013 Intracranial atherosclerotic type changes predominantly involving branch vessels  Carotid Doppler  No evidence of hemodynamically significant internal carotid artery stenosis. Vertebral artery flow is antegrade.   CXR  07/20/2013   1. Dialysis catheter in good position. 2. Small new left pleural effusion. No acute cardiopulmonary disease otherwise noted.     TEE 07/21/2013 no source of embolus, no PFO  EKG  normal sinus rhythm. For complete results please see formal report.   Therapy Recommendations   Physical Exam   Elderly lady not in distress.Awake alert. Afebrile. Head is nontraumatic. Neck is supple without bruit. Hearing is normal. Cardiac exam no murmur or gallop. Lungs are clear to auscultation. Distal pulses are well felt. Neurological Exam : Awake alert oriented x2. Diminished attention restriction recall. Speech is normal. No dysarthria or aphasia. Extraocular movements are full range without nystagmus. Face is symmetric without weakness. Tongue is midline. Motor system exam reveals no upper or lower to drift. No focal weakness. Normal strength normal extremity. Deep tendon pulses are 1+ symmetric. Plantars are downgoing. Touch pinprick consistent with preserved. Coordination appears normal. Gait was not tested. ASSESSMENT [name] is a 73 y.o. female presenting with confusion and difficulty walking.  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 Infarcts felt to be embolic  secondary to unknown etiology. TEE done this am was unrevealing.  On aspirin 81 mg orally every day prior to admission. Now on aspirin 300 mg suppository daily for secondary stroke prevention. Work up underway.   ESRD on HD MWF. For catheter placement. hypertension Diabetes, HgbA1c pending, goal < 7.0 LDL 68 Anemia, 7.9 Hx sundowning Diastolic dysfunction, grade 2  Hospital day # 1  TREATMENT/PLAN  Continue aspirin for secondary stroke prevention. [facility] electrophysiologist will consult and consider placement of an place implantable loop recorder to evaluate for atrial fibrillation as etiology of stroke. This has been explained to patient/family by Dr. Leonie Man and they are agreeable.   Burnetta Sabin, MSN, RN, ANVP-BC, ANP-BC, [facility] Pager: [phone] 07/21/2013 11:37 AM  I have personally obtained a history, examined the patient, evaluated imaging results, and formulated the assessment and plan  of care. I agree with the above. Antony Contras, MD

## 2013-07-21 NOTE — Consult Note (Signed)
ELECTROPHYSIOLOGY CONSULT NOTE  Patient ID: [name] MRN: [id number], DOB/AGE: 74/26/1942   Admit date: 07/20/2013 Date of Consult: 07/21/2013  Primary Physician: [name], MD Primary Cardiologist: New to [place] Reason for Consultation: Cryptogenic stroke; recommendations regarding Implantable Loop Recorder  History of Present Illness [name] is a 73 year old woman with ESRD on HD, HTN, anemia and DM who was admitted on 07/20/2013 with confusion, found to have acute/subacute CVA (scattered acute/ subacute infarcts throughout the supratentorial region bilaterally; two of these tiny infarcts appear to be acute within the right frontal lobe and one infarct appears to be acute within the left frontal lobe whereas other infarcts appear subacute). She has been monitored on telemetry which has demonstrated no arrhythmias. No cause has been identified. Inpatient stroke work-up has been completed with a TEE which was performed today and negative for thrombus or PFO. EP has been asked to evaluate for placement of an implantable loop recorder to monitor for atrial fibrillation.  Past Medical History Past Medical History  Diagnosis Date  . Hypertension   . Cataract   . Diabetes mellitus   . Chronic kidney disease   . Shortness of breath   . Anemia     Past Surgical History Past Surgical History  Procedure Laterality Date  . No past surgeries    . Cataract extraction w/phaco  11/20/2011    Procedure: CATARACT EXTRACTION PHACO AND INTRAOCULAR LENS PLACEMENT (IOC);  Surgeon: Tonny Branch, MD;  Location: [facility];  Service: Ophthalmology;  Laterality: Right;  CDE 18.82  . Cataract extraction w/phaco Left 11/18/2012    Procedure: CATARACT EXTRACTION PHACO AND INTRAOCULAR LENS PLACEMENT (IOC);  Surgeon: Tonny Branch, MD;  Location: [facility];  Service: Ophthalmology;  Laterality: Left;  CDE: 18.97  . Colonoscopy N/A 02/09/2013    Procedure: COLONOSCOPY;  Surgeon: Rogene Houston, MD;   Location: [facility];  Service: Endoscopy;  Laterality: N/A;  305-moved to 220 Ann to notify pt  . Insertion of dialysis catheter Right 06/24/2013    Procedure: INSERTION OF DIALYSIS CATHETER: Ultrasound guided;  Surgeon: Serafina Mitchell, MD;  Location: [facility];  Service: Vascular;  Laterality: Right;    Allergies/Intolerances Allergies  Allergen Reactions  . Ambien [Zolpidem] Other (See Comments)    Hallucinations   . Reglan [Metoclopramide] Other (See Comments)    Hallucinations     Inpatient Medications . aspirin  300 mg Rectal Daily   Or  . aspirin  325 mg Oral Daily  . calcium carbonate  1 tablet Oral TID WC  . cefTRIAXone (ROCEPHIN)  IV  1 g Intravenous Q24H  . [START ON 07/22/2013] ferric gluconate (FERRLECIT/NULECIT) IV  125 mg Intravenous Q M,W,F-HD  . heparin  5,000 Units Subcutaneous Q8H  . insulin aspart  0-15 Units Subcutaneous TID WC  . insulin NPH Human  5 Units Subcutaneous BID AC & HS  . labetalol  200 mg Oral BID  . multivitamin  1 tablet Oral QHS  . pantoprazole  40 mg Oral Q supper    Social History History   Social History  . Marital Status: Widowed    Spouse Name: N/A    Number of Children: N/A  . Years of Education: N/A   Occupational History  . Not on file.   Social History Main Topics  . Smoking status: Never Smoker   . Smokeless tobacco: Never Used  . Alcohol Use: No  . Drug Use: No  . Sexual Activity: Not on  file   Other Topics Concern  . Not on file   Social History Narrative  . No narrative on file     Review of Systems General: No chills, fever, night sweats or weight changes  Cardiovascular:  No chest pain, dyspnea on exertion, edema, orthopnea, palpitations, paroxysmal nocturnal dyspnea Dermatological: No rash, lesions or masses Respiratory: No cough, dyspnea Urologic: No hematuria, dysuria Abdominal: No nausea, vomiting, diarrhea, bright red blood per rectum, melena, or hematemesis Neurologic: No visual changes, weakness,  changes in mental status All other systems reviewed and are otherwise negative except as noted above.  Physical Exam Blood pressure 107/43, pulse 94, temperature 97.9 F (36.6 C), temperature source Oral, resp. rate 13, height 5\' 7"  (1.702 m), weight 169 lb 5 oz (76.8 kg), SpO2 98.00%.  General: Well developed, well appearing 73 y.o. female in no acute distress. HEENT: Normocephalic, atraumatic. EOMs intact. Sclera nonicteric. Oropharynx clear.  Neck: Supple. No JVD. Lungs: Respirations regular and unlabored, CTA bilaterally. No wheezes, rales or rhonchi. Heart: RRR. S1, S2 present. No murmurs, rub, S3 or S4. Abdomen: Soft, non-distended.  Extremities: No clubbing, cyanosis or edema.  Psych: Normal affect. Neuro: Alert and oriented X 3. Moves all extremities spontaneously. Musculoskeletal: No kyphosis. Skin: Intact. Warm and dry. No rashes or petechiae in exposed areas.   Labs Lab Results  Component Value Date   WBC 10.4 07/21/2013   HGB 7.8* 07/21/2013   HCT 23.4* 07/21/2013   MCV 86.3 07/21/2013   PLT 208 07/21/2013    Recent Labs Lab 07/20/13 0910 07/21/13 1248  NA 138 139  K 4.7 5.3  CL 99 101  CO2 25 23  BUN 35* 47*  CREATININE 6.43* 8.08*  CALCIUM 8.3* 8.5  PROT 7.1  --   BILITOT 0.4  --   ALKPHOS 88  --   ALT 16  --   AST 16  --   GLUCOSE 140* 105*    Radiology/Studies Dg Chest 2 View  07/20/2013   CLINICAL DATA:  Stroke.  EXAM: CHEST  2 VIEW  COMPARISON:  DG CHEST 1V PORT dated 06/24/2013  FINDINGS: A dual-lumen catheter noted with tips in stable position. Lungs are clear of infiltrates. Stable cardiomegaly with normal pulmonary vascularity. Small left pleural effusion noted. This is new. No pneumothorax. Degenerative changes both shoulders and thoracic spine.  IMPRESSION: 1. Dialysis catheter in good position. 2. Small new left pleural effusion. No acute cardiopulmonary disease otherwise noted.   Electronically Signed   By: Marcello Moores  Register   On: 07/20/2013 16:30   Mr  Brain Wo Contrast  07/19/2013   CLINICAL DATA:  Weakness and confusion over the past week. Dialysis patient.  EXAM: MRI HEAD WITHOUT CONTRAST  TECHNIQUE: Multiplanar, multiecho pulse sequences of the brain and surrounding structures were obtained without intravenous contrast.  COMPARISON:  06/25/2013 head CT.  11/15/2009 brain MR.  FINDINGS: Prominent small vessel disease type changes (progressive since 2011).  Scattered acute/ subacute infarcts throughout the supratentorial region bilaterally. Two of these tiny infarcts appear to be acute within the right frontal lobe and 1 infarct appears to be acute within the left frontal lobe whereas other infarcts appear subacute.  No intracranial hemorrhage.  Mild atrophy without hydrocephalus.  No intracranial mass lesion noted on this unenhanced exam.  Major intracranial vascular structures appear patent.  Paranasal sinus mucosal thickening with partial opacification left sphenoid sinus air cell.  Exophthalmos.  Decreased signal intensity of bone marrow upper cervical spine may be related to anemia from  dialysis.  IMPRESSION: Prominent small vessel disease type changes (progressive since 2011).  Scattered acute/ subacute infarcts throughout the supratentorial region bilaterally. Two of these tiny infarcts appear to be acute within the right frontal lobe and 1 infarct appears to be acute within the left frontal lobe whereas other infarcts appear subacute.  No intracranial hemorrhage.  Paranasal sinus mucosal thickening with partial opacification left sphenoid sinus air cell.  These results were called by telephone at the time of interpretation on 07/19/2013 at 6:55 PM to Dr. [name] , who verbally acknowledged these results.   Electronically Signed   By: Chauncey Cruel M.D.   On: 07/19/2013 19:02   Transesophageal echocardiogram  - Left Ventricle: The cavity size was normal. Wall thickness was increased in a pattern of mild LVH. Systolic function was vigorous. The  estimated ejection fraction was in the range of 65% to 70%. Wall motion was normal; there were no regional wall motion abnormalities.  - Mitral Valve: Structurally normal, no MR, no vegetation  - Aortic Valve: Structurally normal, no AI, no vegetation  - Tricuspid Valve: Structurally normal, moderate TR, no vegetation  - Pulmonic Valve: Structurally normal, no PR, no vegetation  - Left Atrium/ Left atrial appendage: Dilated, no thrombus in the lA or LAA or RAA  - Atrial septum: No evidence of PFO by Doppler and agitaged saline study without and with Valsalva maneuver  - Aorta: Mild AS plague   12-lead ECG on admission - NSR at 89 bpm Telemetry reviewed - SR  Assessment and Plan 1. Cryptogenic stroke 2. ESRD on HD 3. Anemia 4. HTN 5. DM  The indication for loop recorder insertion / monitoring for AF in setting of cryptogenic stroke was discussed with the patient. The loop recorder insertion procedure was reviewed in detail including risks and benefits. These risks include but are not limited to bleeding and infection. The patient expressed verbal understanding and agrees to proceed. The patient was also counseled regarding wound care and device follow-up.  SignedIleene Hutchinson 07/21/2013, 3:48 PM    EP Attending  Patient seen and examined. Agree with above history, physical exam, assessment and plan. He has a cryptogenic stroke and we will proceed with insertion of an ILR. I have discussed the risks/benefits/goals/expectations of the procedure with the patient and her family and they wish to proceed.  Mikle Bosworth.D.

## 2013-07-21 NOTE — Procedures (Signed)
I was present at this dialysis session. I have reviewed the session itself and made appropriate changes.   [name]  MD 07/21/2013, 12:22 PM

## 2013-07-21 NOTE — Progress Notes (Signed)
PT Cancellation Note  Patient Details Name: MONTSERRAT GRZESIK MRN: [id number] DOB: 1940/10/05   Cancelled Treatment:    Reason Eval/Treat Not Completed: Patient at procedure or test/unavailable.  Pt off of floor for TEE.  Will try back another time.     Kyrel Leighton, Thornton Papas 07/21/2013, 8:00 AM

## 2013-07-21 NOTE — Progress Notes (Signed)
UR complete.  Jing Howatt RN, MSN 

## 2013-07-21 NOTE — Progress Notes (Signed)
Echocardiogram Echocardiogram Transesophageal has been performed.  Joelene Millin 07/21/2013, 9:29 AM

## 2013-07-21 NOTE — Interval H&P Note (Signed)
History and Physical Interval Note:  07/21/2013 9:16 AM  [name]  has presented today for surgery, with the diagnosis of STROKE  The various methods of treatment have been discussed with the patient and family. After consideration of risks, benefits and other options for treatment, the patient has consented to  Procedure(s): TRANSESOPHAGEAL ECHOCARDIOGRAM (TEE) (N/A) as a surgical intervention .  The patient's history has been reviewed, patient examined, no change in status, stable for surgery.  I have reviewed the patient's chart and labs.  Questions were answered to the patient's satisfaction.     Ena Dawley, H

## 2013-07-21 NOTE — CV Procedure (Signed)
     Transesophageal Echocardiogram Note  [name] [id number] 06/14/1941  Procedure: Transesophageal Echocardiogram Indications: Stroke  Procedure Details Consent: Obtained Time Out: Verified patient identification, verified procedure, site/side was marked, verified correct patient position, special equipment/implants available, Radiology Safety Procedures followed,  medications/allergies/relevent history reviewed, required imaging and test results available.  Performed  Medications: Fentanyl: 25 mcg  Versed: 3 mg Zofran 4 mg iv given prior to the procedure for nausea/vomiting  Left Ventrical: The cavity size was normal. Wall thickness was increased in a pattern of mild LVH. Systolic function was vigorous. The estimated ejection fraction was in the range of 65% to 70%. Wall motion was normal; there were no regional wall motion abnormalities.  Mitral Valve: Structurally normal, no MR, no vegetation  Aortic Valve: Structurally normal, no AI, no vegetation  Tricuspid Valve: Structurally normal, moderate TR, no vegetation  Pulmonic Valve: Structurally normal, no PR, no vegetation  Left Atrium/ Left atrial appendage: Dilated, no thrombus in the lA or LAA or RAA  Atrial septum: No evidence of PFO by Doppler and agitaged saline study without and with Valsalva maneuver  Aorta: Mild AS plague   Complications: No apparent complications Patient did tolerate procedure well.  Ena Dawley, Lemmie Evens, MD, [facility] 07/21/2013, 9:18 AM

## 2013-07-21 NOTE — Progress Notes (Signed)
UR complete.  Raley Novicki RN, MSN 

## 2013-07-21 NOTE — H&P (View-Only) (Signed)
NYOBI HERRIAGE 07/20/2013 [name] Requesting Physician:  Hartford Poli  Reason for Consult:  comanagement of ESRD HPI:  84F ESRD on HD in [place] MWF via [facility] admitted today with confusion and findings of subacute CVA.  Also has DM and HTN.  CVA workup underway.  No HD today.  No recent problems with her HD treatments.     Filed Weights   07/20/13 1440  Weight: 75.8 kg (167 lb 1.7 oz)   ROS Balance of 12 systems is negative w/ exceptions as above  Outpt HD Orders Unit: [facility] Days: MWF Time: unk Dialyzer: unk EDW: unk K/Ca: unk Access: unk Needle Size: unk BFR: unk UF Proflie: unk VDRA: unk EPO: unk IV Fe: unk Heparin: unk  PMH  Past Medical History  Diagnosis Date  . Hypertension   . Cataract   . Diabetes mellitus   . Chronic kidney disease   . Shortness of breath   . Anemia    PSH  Past Surgical History  Procedure Laterality Date  . No past surgeries    . Cataract extraction w/phaco  11/20/2011    Procedure: CATARACT EXTRACTION PHACO AND INTRAOCULAR LENS PLACEMENT (IOC);  Surgeon: Tonny Branch, MD;  Location: [facility];  Service: Ophthalmology;  Laterality: Right;  CDE 18.82  . Cataract extraction w/phaco Left 11/18/2012    Procedure: CATARACT EXTRACTION PHACO AND INTRAOCULAR LENS PLACEMENT (IOC);  Surgeon: Tonny Branch, MD;  Location: [facility];  Service: Ophthalmology;  Laterality: Left;  CDE: 18.97  . Colonoscopy N/A 02/09/2013    Procedure: COLONOSCOPY;  Surgeon: Rogene Houston, MD;  [facility];  Service: Endoscopy;  Laterality: N/A;  305-moved to 220 Ann to notify pt  . Insertion of dialysis catheter Right 06/24/2013    Procedure: INSERTION OF DIALYSIS CATHETER: Ultrasound guided;  Surgeon: Serafina Mitchell, MD;  Location: [facility];  Service: Vascular;  Laterality: Right;   FH  Family History  Problem Relation Age of Onset  . Anesthesia problems Neg Hx   . Hypotension Neg Hx   . Malignant hyperthermia Neg Hx   . Pseudochol deficiency Neg Hx   .  Cancer Sister   . Cancer Brother    SH  reports that she has never smoked. She has never used smokeless tobacco. She reports that she does not drink alcohol or use illicit drugs. Allergies  Allergies  Allergen Reactions  . Ambien [Zolpidem] Other (See Comments)    Hallucinations   . Reglan [Metoclopramide] Other (See Comments)    Hallucinations    Home medications Prior to Admission medications   Medication Sig Start Date End Date Taking? Authorizing Provider  acetaminophen (TYLENOL) 325 MG tablet Take 650 mg by mouth every 6 (six) hours as needed (pain).   Yes Historical Provider, MD  aspirin EC 81 MG EC tablet Take 1 tablet (81 mg total) by mouth daily. 07/03/13  Yes Debbe Odea, MD  calcium carbonate (TUMS - DOSED IN MG ELEMENTAL CALCIUM) 500 MG chewable tablet Chew 1 tablet (200 mg of elemental calcium total) by mouth 3 (three) times daily with meals. 07/04/13  Yes Cherene Altes, MD  Cyanocobalamin (VITAMIN B-12 IJ) Inject 1 application as directed every 30 (thirty) days.   Yes Historical Provider, MD  insulin NPH Human (HUMULIN N,NOVOLIN N) 100 UNIT/ML injection Inject 10 Units into the skin 2 (two) times daily at 8 am and 10 pm. 07/04/13  Yes Cherene Altes, MD  labetalol (NORMODYNE) 200 MG tablet Take 200 mg by  mouth 2 (two) times daily.   Yes Historical Provider, MD  minoxidil (LONITEN) 2.5 MG tablet Take 2 tablets (5 mg total) by mouth 2 (two) times daily. 07/04/13  Yes Cherene Altes, MD  multivitamin (RENA-VIT) TABS tablet Take 1 tablet by mouth at bedtime. 07/04/13  Yes Cherene Altes, MD  pantoprazole (PROTONIX) 40 MG tablet Take 1 tablet (40 mg total) by mouth daily with supper. 07/04/13  Yes Cherene Altes, MD  sodium chloride 0.9 % SOLN 100 mL with ferric gluconate 12.5 MG/ML SOLN 125 mg Inject 125 mg into the vein every Monday, Wednesday, and Friday with hemodialysis. 07/04/13  Yes Cherene Altes, MD    Current Medications Current Facility-Administered  Medications  Medication Dose Route Frequency Provider Last Rate Last Dose  . acetaminophen (TYLENOL) tablet 650 mg  650 mg Oral Q4H PRN Verlee Monte, MD       Or  . acetaminophen (TYLENOL) suppository 650 mg  650 mg Rectal Q4H PRN Verlee Monte, MD      . acetaminophen (TYLENOL) tablet 650 mg  650 mg Oral Q6H PRN Verlee Monte, MD      . aspirin suppository 300 mg  300 mg Rectal Daily Verlee Monte, MD       Or  . aspirin tablet 325 mg  325 mg Oral Daily Verlee Monte, MD      . calcium carbonate (TUMS - dosed in mg elemental calcium) chewable tablet 200 mg of elemental calcium  1 tablet Oral TID WC Verlee Monte, MD      . Derrill Memo ON 07/22/2013] ferric gluconate (NULECIT) 125 mg in sodium chloride 0.9 % 100 mL IVPB  125 mg Intravenous Q M,W,F-HD Verlee Monte, MD      . heparin injection 5,000 Units  5,000 Units Subcutaneous Q8H Verlee Monte, MD      . HYDROcodone-acetaminophen (NORCO/VICODIN) 5-325 MG per tablet 1-2 tablet  1-2 tablet Oral Q4H PRN Verlee Monte, MD      . insulin NPH Human (HUMULIN N,NOVOLIN N) injection 5 Units  5 Units Subcutaneous BID AC & HS Mutaz Elmahi, MD      . labetalol (NORMODYNE) tablet 200 mg  200 mg Oral BID Verlee Monte, MD      . minoxidil (LONITEN) tablet 5 mg  5 mg Oral BID Verlee Monte, MD      . morphine 2 MG/ML injection 1-2 mg  1-2 mg Intravenous Q4H PRN Verlee Monte, MD      . multivitamin (RENA-VIT) tablet 1 tablet  1 tablet Oral QHS Verlee Monte, MD      . pantoprazole (PROTONIX) EC tablet 40 mg  40 mg Oral Q supper Verlee Monte, MD         CBC  Recent Labs Lab 07/20/13 0910  WBC 12.3*  NEUTROABS 9.2*  HGB 7.9*  HCT 23.4*  MCV 86.0  PLT [id number]   Basic Metabolic Panel  Recent Labs Lab 07/20/13 0910  NA 138  K 4.7  CL 99  CO2 25  GLUCOSE 140*  BUN 35*  CREATININE 6.43*  CALCIUM 8.3*    Physical Exam  Blood pressure 137/57, pulse 91, temperature 98.7 F (37.1 C), temperature source Oral, resp. rate 16, height 5\' 7"  (1.702 m), weight 75.8 kg  (167 lb 1.7 oz), SpO2 100.00%. GEN: NAD, elderly female ENT: NCAT, poor dentition EYES: EOMI CV: RRR, nl s1s2 no rub PULM: CTAB, no crackles ABD: some TTP in LLQ.  No R/G.  NABS SKIN: no rashes/lesions.  [place]  on R IJ CDI bandage EXT:No LEE   A/P 1. ESRD: HD tomorrow and then return to MWF schedule. Outpt Tx records req 2. HTN/Vol: stable BP currently.  Keep EDW for now once obtained 3. Anemia: cont outpt ESA/Fe dosing 4. MBD: Obtain outpt records.  On Tums 5. CVA: per neurology  [name] MD 07/20/2013, 3:20 PM

## 2013-07-21 NOTE — Progress Notes (Signed)
SLP Cancellation Note  Patient Details Name: [name] MRN: [id number] DOB: 04/24/1941   Cancelled treatment:        Pt. Has been off unit in MRI.  Will complete speech-language-cognitive assessment when able.  Orbie Pyo Chester.Ed CCC-SLP Pager [phone]  07/21/2013                                                                                                 [name] 07/21/2013, 11:07 AM

## 2013-07-21 NOTE — Progress Notes (Signed)
[facility] PROGRESS NOTE  [name] [id number] DOB: 21-Jul-1940 DOA: 07/20/2013 PCP: [name], MD  Assessment/Plan: Acute nonhemorrhagic Stroke  -MRI showed acute/subacute bilateral CVA.  -concerned about embolic source -TEE negative for source of emboli or PFO -loop recorder to be placed today -Patient does have ESRD, hypertension and diabetes mellitus type 2.  -No history of arrhythmias, patient is on low-dose aspirin, increase to full dose of aspirin.  -Patient will proceed with a PT/OT and SLP.  -Check hemoglobin A1c--7.1  -telemetry--no ectopy noted today -carotid ultrasound negative Diabetes mellitus type 2  -Patient is on NPH insulin, give half of the dose while she is in the hospital, we might need to adjust that in the morning.  -Insulin sliding scale, diabetic diet.  -Patient has history of gastroparesis, Reglan as needed.  Abdominal pain -Resolved -May be related to the patient's gastroparesis -Abdomen is soft without any tenderness on exam today ESRD  -appreciate nephrology followup -Nephrology to evaluate, getting dialysis in [facility] on Monday, Wednesday and Friday.  Hypertension  -Blood pressure looks stable -Continue labetalol -Hold minoxidil History of sundowning  -Family patient has history of severe confusion at night, I will not be surprised if she has same symptoms tonight.  History of grade 2 diastolic dysfunction  -With lower extremity edema, blood pressure is controlled, patient for dialysis for volume control.  Pyuria -unclear clinical significance in pt with ESRD and no symptoms  Code Status: Full code  Family Communication: Plan discussed with the patient in the presence of her daughter at bedside.  Disposition Plan: home vs SNF           Procedures/Studies: Dg Chest 2 View  07/20/2013   CLINICAL DATA:  Stroke.  EXAM: CHEST  2 VIEW  COMPARISON:  DG CHEST 1V PORT dated 06/24/2013  FINDINGS: A dual-lumen catheter  noted with tips in stable position. Lungs are clear of infiltrates. Stable cardiomegaly with normal pulmonary vascularity. Small left pleural effusion noted. This is new. No pneumothorax. Degenerative changes both shoulders and thoracic spine.  IMPRESSION: 1. Dialysis catheter in good position. 2. Small new left pleural effusion. No acute cardiopulmonary disease otherwise noted.   Electronically Signed   By: Marcello Moores  Register   On: 07/20/2013 16:30   Dg Chest 2 View  06/24/2013   CLINICAL DATA:  Hypertension, preop dialysis catheter placement.  EXAM: CHEST  2 VIEW  COMPARISON:  August 16, 2009  FINDINGS: The heart size and mediastinal contours are within normal limits. The aorta is slightly uncoiled. Both lungs are clear. There is no focal infiltrate, pulmonary edema, or pleural effusions. The visualized skeletal structures are stable.  IMPRESSION: No active cardiopulmonary disease.   Electronically Signed   By: Abelardo Diesel M.D.   On: 06/24/2013 07:30   Dg Shoulder Right  07/12/2013   CLINICAL DATA:  Right shoulder pain after getting shots and right arm yesterday.  EXAM: RIGHT SHOULDER - 2+ VIEW  COMPARISON:  None.  FINDINGS: There is no evidence of acute fracture or dislocation. Minimal spurring is noted at the margins of the glenoid and acromioclavicular joint. No soft tissue abnormality is identified. Right jugular dual-lumen central venous catheter is partially visualized.  IMPRESSION: No acute osseous abnormality seen.   Electronically Signed   By: Logan Bores   On: 07/12/2013 08:41   Ct Head Wo Contrast  06/25/2013   CLINICAL DATA:  73 year old female with generalized weakness and persistent shaking.  EXAM: CT HEAD WITHOUT CONTRAST  TECHNIQUE: Contiguous axial  images were obtained from the base of the skull through the vertex without intravenous contrast.  COMPARISON:  08/16/2009 head CT  FINDINGS: No acute intracranial abnormalities are identified, including mass lesion or mass effect, hydrocephalus,  extra-axial fluid collection, midline shift, hemorrhage, or acute infarction.  Bilateral basal ganglia calcifications are again noted.  The visualized bony calvarium is unremarkable.  IMPRESSION: No evidence of acute intracranial abnormality.   Electronically Signed   By: Hassan Rowan M.D.   On: 06/25/2013 19:18   Mr Brain Wo Contrast  07/19/2013   CLINICAL DATA:  Weakness and confusion over the past week. Dialysis patient.  EXAM: MRI HEAD WITHOUT CONTRAST  TECHNIQUE: Multiplanar, multiecho pulse sequences of the brain and surrounding structures were obtained without intravenous contrast.  COMPARISON:  06/25/2013 head CT.  11/15/2009 brain MR.  FINDINGS: Prominent small vessel disease type changes (progressive since 2011).  Scattered acute/ subacute infarcts throughout the supratentorial region bilaterally. Two of these tiny infarcts appear to be acute within the right frontal lobe and 1 infarct appears to be acute within the left frontal lobe whereas other infarcts appear subacute.  No intracranial hemorrhage.  Mild atrophy without hydrocephalus.  No intracranial mass lesion noted on this unenhanced exam.  Major intracranial vascular structures appear patent.  Paranasal sinus mucosal thickening with partial opacification left sphenoid sinus air cell.  Exophthalmos.  Decreased signal intensity of bone marrow upper cervical spine may be related to anemia from dialysis.  IMPRESSION: Prominent small vessel disease type changes (progressive since 2011).  Scattered acute/ subacute infarcts throughout the supratentorial region bilaterally. Two of these tiny infarcts appear to be acute within the right frontal lobe and 1 infarct appears to be acute within the left frontal lobe whereas other infarcts appear subacute.  No intracranial hemorrhage.  Paranasal sinus mucosal thickening with partial opacification left sphenoid sinus air cell.  These results were called by telephone at the time of interpretation on 07/19/2013 at 6:55  PM to Dr. [name] , who verbally acknowledged these results.   Electronically Signed   By: Chauncey Cruel M.D.   On: 07/19/2013 19:02   Nm Gastric Emptying  06/30/2013   CLINICAL DATA:  Abdominal pain. Nausea and vomiting. Bloating. Reflux.  EXAM: NUCLEAR MEDICINE GASTRIC EMPTYING SCAN  TECHNIQUE: After oral ingestion of radiolabeled meal, sequential abdominal images were obtained for 120 minutes. Residual percentage of activity remaining within the stomach was calculated at 60 and 120 minutes.  COMPARISON:  None.  RADIOPHARMACEUTICALS:  2 mCi Technetium 99-m labeled sulfur colloid  FINDINGS: Expected location of the stomach in the left upper quadrant. Ingested meal empties the stomach gradually over the course of the study with 100% retention at 60 min and 80% retention at 120 min (normal retention less than 30% at 120 min).  IMPRESSION: Markedly delayed gastric emptying consistent with gastroparesis.   Electronically Signed   By: Evangeline Dakin M.D.   On: 06/30/2013 18:38   Dg Chest Port 1 View  06/24/2013   CLINICAL DATA:  Postoperative study, rule out pneumothorax  EXAM: PORTABLE CHEST - 1 VIEW  COMPARISON:  PA and lateral chest x-ray of earlier today  FINDINGS: The patient has undergone interval placement of a large caliber dual-lumen dialysis type catheter via the right internal jugular approach. The tip of the catheter lies in the region of the junction of the SVC with the right atrium. There is no evidence of a postprocedure pneumothorax or hemothorax. Both lungs are clear. The cardiopericardial silhouette is mildly  enlarged. The pulmonary vascularity is not engorged.  IMPRESSION: There is no evidence of a postprocedure complication following placement of the dialysis type catheter via the right internal jugular approach.   Electronically Signed   By: Jaimi Belle  [place]   On: 06/24/2013 09:16   Mr Jodene Nam Head/brain Wo Cm  07/21/2013   CLINICAL DATA:  Admitted with confusion.  EXAM: MRA HEAD WITHOUT  CONTRAST  TECHNIQUE: Angiographic images of the Circle of Willis were obtained using MRA technique without intravenous contrast.  COMPARISON:  07/19/2013 MR brain. 11/15/2009 MR brain and MR angiogram.  FINDINGS: Anterior circulation without medium or large size vessel significant stenosis or occlusion.  Mild irregularity with very mild narrowing M1 segment middle cerebral artery bilaterally and A1 segment right anterior cerebral artery.  Middle cerebral artery branch vessel irregularity and mild narrowing greater on the left.  No significant stenosis of the distal vertebral arteries or basilar artery. Mild irregularity with minimal narrowing distal left vertebral artery and proximal basilar artery.  Non visualization anterior inferior cerebellar artery bilaterally.  Mild narrowing and irregularity of the superior cerebellar artery bilaterally.  Mild to moderate narrowing of the P1 segment of the right posterior cerebral artery. Fetal type contribution to the right posterior cerebral artery.  Mild narrowing and irregularity of posterior cerebral artery distal branches bilaterally.  No aneurysm noted.  Sphenoid sinus air cell opacification.  IMPRESSION: Intracranial atherosclerotic type changes predominantly involving branch vessels as detailed above.   Electronically Signed   By: Chauncey Cruel M.D.   On: 07/21/2013 12:06         Subjective: Patient is more alert today. She denies any fevers, chills, chest discomfort, shortness breath, nausea, vomiting, diarrhea, abdominal pain.  Objective: Filed Vitals:   07/21/13 1500 07/21/13 1530 07/21/13 1540 07/21/13 1553  BP: 111/49 107/43 111/46 126/41  Pulse: 92 94 96 99  Temp:   98.5 F (36.9 C) 98.5 F (36.9 C)  TempSrc:   Oral Oral  Resp:   14 18  Height:      Weight:   74.3 kg (163 lb 12.8 oz)   SpO2:   98% 100%    Intake/Output Summary (Last 24 hours) at 07/21/13 1722 Last data filed at 07/21/13 1540  Gross per 24 hour  Intake  47.33 ml   Output   2551 ml  Net -2503.67 ml   Weight change:  Exam:   General:  Pt is alert, follows commands appropriately, not in acute distress  HEENT: No icterus, No thrush,  [place]/AT  Cardiovascular: RRR, S1/S2, no rubs, no gallops  Respiratory: CTA bilaterally, no wheezing, no crackles, no rhonchi  Abdomen: Soft/+BS, non tender, non distended, no guarding  Extremities: No edema, No lymphangitis, No petechiae, No rashes, no synovitis  Data Reviewed: Basic Metabolic Panel:  Recent Labs Lab 07/20/13 0910 07/21/13 1248  NA 138 139  K 4.7 5.3  CL 99 101  CO2 25 23  GLUCOSE 140* 105*  BUN 35* 47*  CREATININE 6.43* 8.08*  CALCIUM 8.3* 8.5  PHOS  --  3.9   Liver Function Tests:  Recent Labs Lab 07/20/13 0910 07/21/13 1248  AST 16  --   ALT 16  --   ALKPHOS 88  --   BILITOT 0.4  --   PROT 7.1  --   ALBUMIN 2.9* 2.7*    Recent Labs Lab 07/20/13 0910  LIPASE 29   No results found for this basename: AMMONIA,  in the last 168 hours CBC:  Recent Labs  Lab 07/20/13 0910 07/21/13 1248  WBC 12.3* 10.4  NEUTROABS 9.2*  --   HGB 7.9* 7.8*  HCT 23.4* 23.4*  MCV 86.0 86.3  PLT 194 208   Cardiac Enzymes: No results found for this basename: CKTOTAL, CKMB, CKMBINDEX, TROPONINI,  in the last 168 hours BNP: No components found with this basename: POCBNP,  CBG:  Recent Labs Lab 07/20/13 0907 07/20/13 1752 07/20/13 2120 07/21/13 0640 07/21/13 1146  GLUCAP 120* 152* 159* 95 100*    No results found for this or any previous visit (from the past 240 hour(s)).   Scheduled Meds: . aspirin  300 mg Rectal Daily   Or  . aspirin  325 mg Oral Daily  . calcium carbonate  1 tablet Oral TID WC  . cefTRIAXone (ROCEPHIN)  IV  1 g Intravenous Q24H  . chlorhexidine  60 mL Topical Once  . [START ON 07/22/2013] ferric gluconate (FERRLECIT/NULECIT) IV  125 mg Intravenous Q M,W,F-HD  . heparin  5,000 Units Subcutaneous Q8H  . insulin aspart  0-15 Units Subcutaneous TID WC  .  insulin NPH Human  5 Units Subcutaneous BID AC & HS  . labetalol  200 mg Oral BID  . multivitamin  1 tablet Oral QHS  . pantoprazole  40 mg Oral Q supper   Continuous Infusions:    Shanekia Latella, DO  [facility] Pager [phone]  If 7PM-7AM, please contact night-coverage [email or web address] Password [email or web address] 07/21/2013, 5:22 PM   LOS: 1 day

## 2013-07-21 NOTE — Progress Notes (Signed)
Physical Therapy Note  Pt in HD this pm.  Will f/u for therapy eval tomorrow.    [place], [place]

## 2013-07-22 ENCOUNTER — Encounter (HOSPITAL_COMMUNITY): Payer: Self-pay | Admitting: Cardiology

## 2013-07-22 DIAGNOSIS — E119 Type 2 diabetes mellitus without complications: Secondary | ICD-10-CM

## 2013-07-22 LAB — GLUCOSE, CAPILLARY
Glucose-Capillary: 130 mg/dL — ABNORMAL HIGH (ref 70–99)
Glucose-Capillary: 210 mg/dL — ABNORMAL HIGH (ref 70–99)
Glucose-Capillary: 119 mg/dL — ABNORMAL HIGH (ref 70–99)

## 2013-07-22 MED ORDER — ERYTHROMYCIN BASE 250 MG PO TABS: 250.0000 mg | ORAL_TABLET | Freq: Three times a day (TID) | ORAL | Status: DC

## 2013-07-22 MED ORDER — ASPIRIN 325 MG PO TABS: 325.0000 mg | ORAL_TABLET | Freq: Every day | ORAL | Status: DC

## 2013-07-22 NOTE — Procedures (Signed)
I was present at this dialysis session. I have reviewed the session itself and made appropriate changes.   [name]  MD 07/22/2013, 1:06 PM

## 2013-07-22 NOTE — Progress Notes (Signed)
Pt. DC'd via car with family.  DC instructions given and prescriptions sent to [facility].  Vital signs and assessments were stable.

## 2013-07-22 NOTE — Progress Notes (Signed)
Physical Therapy Evaluation  Past Medical History  Diagnosis Date  . Hypertension   . Cataract   . Diabetes mellitus   . Chronic kidney disease   . Shortness of breath   . Anemia   . CVA (cerebral infarction)    Past Surgical History  Procedure Laterality Date  . No past surgeries    . Cataract extraction w/phaco  11/20/2011    Procedure: CATARACT EXTRACTION PHACO AND INTRAOCULAR LENS PLACEMENT (IOC);  Surgeon: Tonny Branch, MD;  Location: [facility];  Service: Ophthalmology;  Laterality: Right;  CDE 18.82  . Cataract extraction w/phaco Left 11/18/2012    Procedure: CATARACT EXTRACTION PHACO AND INTRAOCULAR LENS PLACEMENT (IOC);  Surgeon: Tonny Branch, MD;  Location: [facility];  Service: Ophthalmology;  Laterality: Left;  CDE: 18.97  . Colonoscopy N/A 02/09/2013    Procedure: COLONOSCOPY;  Surgeon: Rogene Houston, MD;  Location: [facility];  Service: Endoscopy;  Laterality: N/A;  305-moved to 220 Ann to notify pt  . Insertion of dialysis catheter Right 06/24/2013    Procedure: INSERTION OF DIALYSIS CATHETER: Ultrasound guided;  Surgeon: Serafina Mitchell, MD;  Location: [facility];  Service: Vascular;  Laterality: Right;  . 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 Tee without cardioversion N/A 07/21/2013    Procedure: TRANSESOPHAGEAL ECHOCARDIOGRAM (TEE);  Surgeon: Dorothy Spark, MD;  Location[facility];  Service: Cardiovascular;  Laterality: N/A;    07/22/13 1300  PT Visit Information  Last PT Received On 07/22/13  Assistance Needed +1  History of Present Illness pt presents with Bil acute and subacute Tentorial Infarcts.    Precautions  Precautions Fall  Restrictions  Weight Bearing Restrictions No  Home Living  Family/patient expects to be discharged to: Private residence  Living Arrangements Children  Available Help at Discharge Family;Available 24 hours/day  Type of Home House  Home Access Stairs to enter  Entrance Stairs-Number of  Steps 1  Entrance Stairs-Rails None  Home Layout One level  Home Equipment Walker - 4 wheels;BSC;Tub bench  Prior Function  Level of [place] Needs assistance  ADL's / [place] performs all homemaking and has been asissting pt recently with bathing.    Communication  Communication No difficulties  Cognition  Arousal/Alertness Awake/alert  Behavior During Therapy WFL for tasks assessed/performed  Overall Cognitive Status Within Functional Limits for tasks assessed  Upper Extremity Assessment  Upper Extremity Assessment Defer to OT evaluation  Lower Extremity Assessment  Lower Extremity Assessment Generalized weakness  Cervical / Trunk Assessment  Cervical / Trunk Assessment Kyphotic  Transfers  Overall transfer level Needs assistance  Equipment used Rolling walker (2 wheeled)  Transfers Sit to/from Stand  Sit to Stand Min guard  General transfer comment cues for UE use.    Ambulation/Gait  Ambulation/Gait assistance Min guard  Ambulation Distance (Feet) 50 Feet (and 100)  Assistive device Rolling walker (2 wheeled)  Gait Pattern/deviations Step-through pattern;Decreased stride length;Trunk flexed  General Gait Details pt generally unsteady and needs RW for support and safety.    Stairs Yes  Stairs assistance Min assist  Stair Management No rails;Step to pattern;Forwards;With walker  Number of Stairs 1  General stair comments A for management of RW and guarding for safety.    Modified Rankin (Stroke Patients Only)  Pre-Morbid Rankin Score 3  Modified Rankin 4  Balance  Overall balance assessment Needs assistance  Standing balance support Bilateral upper extremity supported  Standing balance-Leahy Scale Poor  PT - End of Session  Equipment Utilized During Treatment Gait belt  Activity Tolerance Patient tolerated treatment well  Patient left in chair;with call bell/phone within reach;with family/visitor present  Nurse Communication Mobility  status  PT Assessment  PT Recommendation/Assessment Patient needs continued PT services  PT Problem List Decreased strength;Decreased activity tolerance;Decreased balance;Decreased mobility;Decreased coordination;Decreased knowledge of use of DME  PT Therapy Diagnosis  Difficulty walking  PT Plan  PT Frequency Min 4X/week  PT Treatment/Interventions DME instruction;Gait training;Stair training;Functional mobility training;Therapeutic activities;Therapeutic exercise;Balance training;Neuromuscular re-education;Patient/family education  PT Recommendation  Follow Up Recommendations Home health PT;Supervision/Assistance - 24 hour  PT equipment None recommended by PT  Individuals Consulted  Consulted and Agree with Results and Recommendations Patient  Acute Rehab PT Goals  Patient Stated Goal Home  PT Goal Formulation With patient  Time For Goal Achievement 08/05/13  Potential to Achieve Goals Fair  PT Time Calculation  PT Start Time 1032  PT Stop Time 1056  PT Time Calculation (min) 24 min  PT General Charges  $$ ACUTE PT VISIT 1 Procedure  PT Evaluation  $Initial PT Evaluation Tier I 1 Procedure  PT Treatments  $Gait Training 8-22 mins   [place], PT [phone]

## 2013-07-22 NOTE — Care Management Note (Signed)
    Page 1 of 2   07/22/2013     3:38:55 PM   CARE MANAGEMENT NOTE 07/22/2013  Patient:  [name], [name]   Account Number:  [id number]  Date Initiated:  07/21/2013  Documentation initiated by:  Lorne Skeens  Subjective/Objective Assessment:   Patient with multiple co-morbidities, readmission from 06/25/13-07/04/13.  Admitted with CVA. Lives at home with family     Action/Plan:   Will follow for discharge needs pending PT/OT evals and physician orders   Anticipated DC Date:  07/22/2013   Anticipated DC Plan:  [place]  CM consult      Choice offered to / List presented to:  C-4 Adult Children   DME arranged  [place]      DME agency  [place] arranged  [place].   Status of service:  Completed, signed off Medicare Important Message given?   (If response is "NO", the following Medicare IM given date fields will be blank) Date Medicare IM given:   Date Additional Medicare IM given:    Discharge Disposition:  [place]  Per UR Regulation:  Reviewed for med. necessity/level of care/duration of stay  If discussed at [place] of Stay Meetings, dates discussed:    Comments:  07/22/13 [place] RN, MSN, CM- Met with patient's daughter to discuss discharge planning. Patient's daughter is interested in home health PT and has chosen [facility] for her mother's therapy, which the family has worked with in the past.  Plan is for patient to discharge home with daughter tonight after dialysis.  [facility] was notified of order for a rolling walker for discharge home today.  Patient's address and phone number were verified in the system.  Primary contact for appointments etc is daughter Kiante Petrovich [phone].

## 2013-07-22 NOTE — Progress Notes (Signed)
Stroke Team Progress Note  HISTORY [name] is an 73 y.o. female with a history of end-stage renal disease on hemodialysis, diabetes mellitus and hypertension, who was instructed by her PCP to come to the emergency room for further evaluation following an MRI study which showed findings consistent with acute/subacute stroke. Two small right frontal and one small left frontal lesion were noted. Family members have noted transient changes in mental status with confusion. She's also had difficulty with walking which appear to affect both lower extremities. She's had no changes in speech. She has not noted any upper extremity symptoms. NIH stroke score was 1. Time of onset of cerebral infarctions is unclear. Patient has been taking aspirin 81 mg per day. LSN: Unclear  Patient was not administerd TPA secondary to delay in arrival. She was admitted for further evaluation and treatment.  SUBJECTIVE Stable. Had loop recorder placed y`day  OBJECTIVE Most recent Vital Signs: Filed Vitals:   07/21/13 1841 07/21/13 2205 07/22/13 0154 07/22/13 0625  BP: 129/47 106/55 104/57 114/60  Pulse: 99 98 92 94  Temp: 98.2 F (36.8 C) 99.4 F (37.4 C) 98.7 F (37.1 C) 99.1 F (37.3 C)  TempSrc: Oral Oral Oral Oral  Resp: 18 18 20 18   Height:      Weight:      SpO2: 98% 96% 98% 96%   CBG (last 3)   Recent Labs  07/21/13 1146 07/21/13 2201 07/22/13 0709  GLUCAP 100* 244* 130*    IV Fluid Intake:   . sodium chloride      MEDICATIONS  . aspirin  300 mg Rectal Daily   Or  . aspirin  325 mg Oral Daily  . calcium carbonate  1 tablet Oral TID WC  . cefTRIAXone (ROCEPHIN)  IV  1 g Intravenous Q24H  . chlorhexidine  60 mL Topical Once  . ferric gluconate (FERRLECIT/NULECIT) IV  125 mg Intravenous Q M,W,F-HD  . heparin  5,000 Units Subcutaneous Q8H  . insulin aspart  0-15 Units Subcutaneous TID WC  . insulin NPH Human  5 Units Subcutaneous BID AC & HS  . labetalol  200 mg Oral BID  .  multivitamin  1 tablet Oral QHS  . pantoprazole  40 mg Oral Q supper   PRN:  acetaminophen, acetaminophen, acetaminophen, HYDROcodone-acetaminophen, morphine injection  Diet:  Renal thin liquids Activity:   DVT Prophylaxis:  Heparin 5000 units sq tid   CLINICALLY SIGNIFICANT STUDIES Basic Metabolic Panel:   Recent Labs Lab 07/20/13 0910 07/21/13 1248  NA 138 139  K 4.7 5.3  CL 99 101  CO2 25 23  GLUCOSE 140* 105*  BUN 35* 47*  CREATININE 6.43* 8.08*  CALCIUM 8.3* 8.5  PHOS  --  3.9   Liver Function Tests:   Recent Labs Lab 07/20/13 0910 07/21/13 1248  AST 16  --   ALT 16  --   ALKPHOS 88  --   BILITOT 0.4  --   PROT 7.1  --   ALBUMIN 2.9* 2.7*   CBC:   Recent Labs Lab 07/20/13 0910 07/21/13 1248  WBC 12.3* 10.4  NEUTROABS 9.2*  --   HGB 7.9* 7.8*  HCT 23.4* 23.4*  MCV 86.0 86.3  PLT 194 208   Coagulation: No results found for this basename: LABPROT, INR,  in the last 168 hours Cardiac Enzymes: No results found for this basename: CKTOTAL, CKMB, CKMBINDEX, TROPONINI,  in the last 168 hours Urinalysis:   Recent Labs Lab 07/20/13 [place]  YELLOW  LABSPEC 1.024  PHURINE 5.0  GLUCOSEU NEGATIVE  HGBUR NEGATIVE  BILIRUBINUR SMALL*  KETONESUR 15*  PROTEINUR 100*  UROBILINOGEN 0.2  NITRITE NEGATIVE  LEUKOCYTESUR SMALL*   Lipid Panel    Component Value Date/Time   CHOL 116 07/21/2013 0720   TRIG 97 07/21/2013 0720   HDL 29* 07/21/2013 0720   CHOLHDL 4.0 07/21/2013 0720   VLDL 19 07/21/2013 0720   LDLCALC 68 07/21/2013 0720   HgbA1C  Lab Results  Component Value Date   HGBA1C 7.1* 07/21/2013    Urine Drug Screen:   No results found for this basename: labopia,  cocainscrnur,  labbenz,  amphetmu,  thcu,  labbarb    Alcohol Level: No results found for this basename: ETH,  in the last 168 hours   MRI of the brain  07/19/2013   Prominent small vessel disease type changes (progressive since 2011).  Scattered acute/ subacute infarcts throughout the  supratentorial region bilaterally. Two of these tiny infarcts appear to be acute within the right frontal lobe and 1 infarct appears to be acute within the left frontal lobe whereas other infarcts appear subacute.  No intracranial hemorrhage.  Paranasal sinus mucosal thickening with partial opacification left sphenoid sinus air cell.    MRA of the brain  07/21/2013 Intracranial atherosclerotic type changes predominantly involving branch vessels  Carotid Doppler  No evidence of hemodynamically significant internal carotid artery stenosis. Vertebral artery flow is antegrade.   CXR  07/20/2013   1. Dialysis catheter in good position. 2. Small new left pleural effusion. No acute cardiopulmonary disease otherwise noted.     TEE 07/21/2013 no source of embolus, no PFO  EKG  normal sinus rhythm. For complete results please see formal report.   Therapy Recommendations   Physical Exam   Elderly lady not in distress.Awake alert. Afebrile. Head is nontraumatic. Neck is supple without bruit. Hearing is normal. Cardiac exam no murmur or gallop. Lungs are clear to auscultation. Distal pulses are well felt. Neurological Exam : Awake alert oriented x2. Diminished attention restriction recall. Speech is normal. No dysarthria or aphasia. Extraocular movements are full range without nystagmus. Face is symmetric without weakness. Tongue is midline. Motor system exam reveals no upper or lower to drift. No focal weakness. Normal strength normal extremity. Deep tendon pulses are 1+ symmetric. Plantars are downgoing. Touch pinprick consistent with preserved. Coordination appears normal. Gait was not tested.  ASSESSMENT [name] is a 73 y.o. female presenting with confusion and difficulty walking.  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 Infarcts felt to be embolic secondary to unknown etiology. TEE done this am was unrevealing.  On aspirin 81 mg orally every day prior to admission.  Now on aspirin 300 mg suppository daily for secondary stroke prevention. Work up underway.   ESRD on HD MWF @ [facility]. For catheter placement. hypertension Diabetes, HgbA1c 7.1, goal < 7.0 LDL 68 Anemia, 7.9 Hx sundowning Diastolic dysfunction, grade 2  Hospital day # 2  TREATMENT/PLAN  Continue aspirin for secondary stroke prevention. [facility] electrophysiologist has consulted and plans placement of an place implantable loop recorder to evaluate for atrial fibrillation as etiology of stroke.  OOB, therapy evals Stroke service will sign off. Call with questions F/u as outpatient in 2 months [facility]roke [facility]  Antony Contras, MD

## 2013-07-22 NOTE — Evaluation (Signed)
Occupational Therapy Evaluation Patient Details Name: [name] MRN: [id number] DOB: 08/12/1940 Today's Date: 07/22/2013 Time: [id number] OT Time Calculation (min): 14 min  OT Assessment / Plan / Recommendation History of present illness [name] is an 73 y.o. female with a history of end-stage renal disease on hemodialysis, diabetes mellitus and hypertension, who was instructed by her PCP to come to the emergency room for further evaluation following an MRI study which showed findings consistent with acute/subacute stroke. Two small right frontal and one small left frontal lesion were noted. Family members have noted transient changes in mental status with confusion. She's also had difficulty with walking which appear to affect both lower extremities. She's had no changes in speech. She has not noted any upper extremity symptoms. NIH stroke score was 1. Time of onset of cerebral infarctions is unclear. Patient has been taking aspirin 81 mg per day. LSN: Unclear  Patient was not administerd TPA secondary to delay in arrival. She was admitted for further evaluation and treatment.    Clinical Impression   Pt admitted with above. Will continue to follow pt acutely in order to address below problem list in prep for return home. Pt has good support system at home from family.    OT Assessment  Patient needs continued OT Services    Follow Up Recommendations  No OT follow up;Supervision/Assistance - 24 hour    Barriers to Discharge      Equipment Recommendations  None recommended by OT    Recommendations for Other Services    Frequency  Min 2X/week    Precautions / Restrictions Precautions Precautions: Fall Restrictions Weight Bearing Restrictions: No   Pertinent Vitals/Pain See vitals    ADL  Upper Body Dressing: Performed;Supervision/safety Where Assessed - Upper Body Dressing: Unsupported sitting Toilet Transfer: Simulated;Minimal assistance Toilet Transfer  Method: Sit to stand Toilet Transfer Equipment:  (chair) Equipment Used: Gait belt;Rolling walker Transfers/Ambulation Related to ADLs: Min guard with RW. ADL Comments: Pt is generally unsteady and requires frequent rest breaks during mobility tasks.  Daughter present during session. Educated pt and daughter on using 3n1 in bedroom to help minimize fall risk and to conserve energy during toilet transfers.  Also discussed home safety awareness such as picking throw rugs and removing any tripping hazards.    OT Diagnosis: Generalized weakness  OT Problem List: Decreased strength;Decreased activity tolerance;Impaired balance (sitting and/or standing);Decreased knowledge of use of DME or AE OT Treatment Interventions: Self-care/ADL training;DME and/or AE instruction;Therapeutic activities;Patient/family education;Balance training   OT Goals(Current goals can be found in the care plan section) Acute Rehab OT Goals Patient Stated Goal: Home OT Goal Formulation: With patient Time For Goal Achievement: 07/29/13 Potential to Achieve Goals: Good  Visit Information  Last OT Received On: 07/22/13 Assistance Needed: +1 History of Present Illness: pt presents with Bil acute and subacute Tentorial Infarcts.         Prior [place] expects to be discharged to:: Private residence Living Arrangements: Children Available Help at Discharge: Family;Available 24 hours/day Type of Home: House Home Access: Stairs to enter [employer] of Steps: 1 Entrance Stairs-Rails: None Home Layout: One level Home Equipment: Walker - 4 wheels;Bedside commode;Tub bench Prior Function Level of Independence: Needs assistance ADL's / Homemaking Assistance Needed: Family performs all homemaking and has been asissting pt recently with bathing.   Communication Communication: No difficulties         Vision/Perception     Cognition  Cognition Arousal/Alertness:  Awake/alert Behavior During Therapy: WFL for tasks assessed/performed Overall Cognitive Status: Within Functional Limits for tasks assessed    Extremity/Trunk Assessment Upper Extremity Assessment Upper Extremity Assessment: Overall WFL for tasks assessed Lower Extremity Assessment Lower Extremity Assessment: Generalized weakness Cervical / Trunk Assessment Cervical / Trunk Assessment: Kyphotic     Mobility Transfers Overall transfer level: Needs assistance Equipment used: Rolling walker (2 wheeled) Transfers: Sit to/from Stand Sit to Stand: Min guard General transfer comment: cues for UE use.       Exercise     Balance Balance Overall balance assessment: Needs assistance Standing balance support: Bilateral upper extremity supported Standing balance-Leahy Scale: Poor   End of Session OT - End of Session Equipment Utilized During Treatment: Gait belt;Rolling walker Activity Tolerance: Patient limited by fatigue Patient left: in chair;with call bell/phone within reach;with family/visitor present Nurse Communication: Mobility status  GO    07/22/2013 Darrol Jump OTR/L Pager [phone] Office [phone]  Darrol Jump 07/22/2013, 2:39 PM

## 2013-07-22 NOTE — Discharge Summary (Signed)
Physician Discharge Summary  [name] [id number] DOB: Oct 14, 1940 DOA: 07/20/2013  PCP: [name], MD  Admit date: 07/20/2013 Discharge date: 07/22/2013  Recommendations for Outpatient Follow-up:  1. Pt will need to follow up with PCP in 2 weeks post discharge 2. Please obtain BMP to evaluate electrolytes and kidney function 3. Please also check CBC to evaluate Hg and Hct levels  Discharge Diagnoses:  Active Problems:   DIABETES MELLITUS, TYPE II   HYPERTENSION   End stage renal disease   CVA (cerebral infarction) Acute nonhemorrhagic Stroke  -MRI showed acute/subacute bilateral CVA.  -concerned about embolic source  -TEE negative for source of emboli or PFO  -loop recorder to be placed 07/22/23 -Patient does have ESRD, hypertension and diabetes mellitus type 2.  -No history of arrhythmias, patient is on low-dose aspirin, increase to full dose of aspirin.  -Patient will proceed with a PT/OT-->recommended HHPT  -Check hemoglobin A1c--7.1  -carotid ultrasound negative  -per neurology recommendations, pt will be started on ASA 325mg  daily upon d/c Diabetes mellitus type 2  -Patient is on NPH insulin, give half of the dose while she is in the hospital, we might need to adjust that in the morning.  -Insulin sliding scale, diabetic diet.  -Patient has history of gastroparesis -Daughter states that patient was not able to tolerate Reglan due agitation and mental status changes -The patient will resume her home dose of NPH at the time of discharge Abdominal pain  -Resolved--pt tolerated diet without problems -May be related to the patient's gastroparesis--gastric and being started on 06/30/2013 showed marked delay in gastric emptying.  -Abdomen is soft without any tenderness on exam today  -Unable to tolerate metoclopramide as discussed above -Send patient home with a trial of erythromycin for her gastroparesis ESRD  -appreciate nephrology followup  -Nephrology to evaluate,  getting dialysis in [facility] on Monday, Wednesday and Friday.  Hypertension  -Continue labetalol  -Hold minoxidil  -Patient's blood pressure remained stable off of minoxidil; therefore, it would not be restarted History of sundowning  -Family patient has history of severe confusion at night, I will not be surprised if she has same symptoms tonight.  History of grade 2 diastolic dysfunction  -With lower extremity edema, blood pressure is controlled, patient for dialysis for volume control.  Pyuria  -unclear clinical significance in pt with ESRD and no symptoms  -d/c antibiotics Code Status: Full code  Family Communication: Plan discussed with the patient in the presence of her daughter at bedside.  Disposition Plan: home with [facility]   Discharge Condition:  Stable Disposition:  discharge home   Diet:renal Wt Readings from Last 3 Encounters:  07/22/13 75.6 kg (166 lb 10.7 oz)  07/22/13 75.6 kg (166 lb 10.7 oz)  07/22/13 75.6 kg (166 lb 10.7 oz)    History of present illness:  73 y.o. female with a history of end-stage renal disease on hemodialysis, diabetes mellitus and hypertension, who was instructed by her PCP to come to the emergency room for further evaluation following an MRI study which showed findings consistent with acute/subacute stroke. Two small right frontal and one small left frontal lesion were noted. Family members have noted transient changes in mental status with confusion. She's also had difficulty with walking which appear to affect both lower extremities. She's had no changes in speech. She has not noted any upper extremity symptoms. NIH stroke score was 1. Time of onset of cerebral infarctions is unclear. Patient has been taking aspirin 81 mg per day.  LSN: Unclear  Patient was not administerd TPA secondary to delay in arrival. She was admitted for further evaluation and treatment. Neurology was consulted. A full stroke workup was undertaken. A loop recorder was  ultimately placed on 07/21/2013. The patient's case was discussed with neurology, Dr. Leonie Man, on the day of discharge and cleared the patient for discharge. Physical therapy was consulted. They recommended home health physical therapy. This was up with assistance of care management. Nephrology was consulted for maintenance dialysis. The patient remained hemodynamically stable and afebrile.       Consultants: Nephrology neurology  Discharge Exam: Filed Vitals:   07/22/13 1500  BP: 126/62  Pulse: 92  Temp:   Resp:    Filed Vitals:   07/22/13 1330 07/22/13 1400 07/22/13 1430 07/22/13 1500  BP: 131/60 118/55 108/54 126/62  Pulse: 92 91 87 92  Temp:      TempSrc:      Resp:  16    Height:      Weight:      SpO2:       General: A&O x 3, NAD, pleasant, cooperative Cardiovascular: RRR, no rub, no gallop, no S3 Respiratory: CTAB, no wheeze, no rhonchi Abdomen:soft, nontender, nondistended, positive bowel sounds Extremities: No edema, No lymphangitis, no petechiae  Discharge Instructions      Discharge Orders   Future Appointments Provider Department Dept Phone   08/02/2013 10:30 AM [facility] [phone]   08/02/2013 11:00 AM [facility] [id number]   08/02/2013 11:45 AM Mal Misty, MD [facility] [phone]   08/03/2013 4:30 PM [facility] [place] Office [phone]   Future Orders Complete By Expires   Diet - low sodium heart healthy  As directed    Increase activity slowly  As directed        Medication List    STOP taking these medications       aspirin 81 MG EC tablet  Replaced by:  aspirin 325 MG tablet     minoxidil 2.5 MG tablet  Commonly known as:  LONITEN      TAKE these medications       acetaminophen 325 MG tablet  Commonly known as:  TYLENOL  Take 650 mg by mouth every 6 (six) hours as needed (pain).      aspirin 325 MG tablet  Take 1 tablet (325 mg total) by mouth daily.     calcium carbonate 500 MG chewable tablet  Commonly known as:  TUMS - dosed in mg elemental calcium  Chew 1 tablet (200 mg of elemental calcium total) by mouth 3 (three) times daily with meals.     erythromycin 250 MG tablet  Commonly known as:  E-MYCIN  Take 1 tablet (250 mg total) by mouth 3 (three) times daily before meals.     insulin NPH Human 100 UNIT/ML injection  Commonly known as:  HUMULIN N,NOVOLIN N  Inject 10 Units into the skin 2 (two) times daily at 8 am and 10 pm.     labetalol 200 MG tablet  Commonly known as:  NORMODYNE  Take 200 mg by mouth 2 (two) times daily.     multivitamin Tabs tablet  Take 1 tablet by mouth at bedtime.     pantoprazole 40 MG tablet  Commonly known as:  PROTONIX  Take 1 tablet (40 mg total) by mouth daily with supper.     sodium chloride  0.9 % SOLN 100 mL with ferric gluconate 12.5 MG/ML SOLN 125 mg  Inject 125 mg into the vein every Monday, Wednesday, and Friday with hemodialysis.     VITAMIN B-12 IJ  Inject 1 application as directed every 30 (thirty) days.         The results of significant diagnostics from this hospitalization (including imaging, microbiology, ancillary and laboratory) are listed below for reference.    Significant Diagnostic Studies: Dg Chest 2 View  07/20/2013   CLINICAL DATA:  Stroke.  EXAM: CHEST  2 VIEW  COMPARISON:  DG CHEST 1V PORT dated 06/24/2013  FINDINGS: A dual-lumen catheter noted with tips in stable position. Lungs are clear of infiltrates. Stable cardiomegaly with normal pulmonary vascularity. Small left pleural effusion noted. This is new. No pneumothorax. Degenerative changes both shoulders and thoracic spine.  IMPRESSION: 1. Dialysis catheter in good position. 2. Small new left pleural effusion. No acute cardiopulmonary disease otherwise noted.   Electronically Signed   By: Marcello Moores  Register   On: 07/20/2013 16:30   Dg Chest 2  View  06/24/2013   CLINICAL DATA:  Hypertension, preop dialysis catheter placement.  EXAM: CHEST  2 VIEW  COMPARISON:  August 16, 2009  FINDINGS: The heart size and mediastinal contours are within normal limits. The aorta is slightly uncoiled. Both lungs are clear. There is no focal infiltrate, pulmonary edema, or pleural effusions. The visualized skeletal structures are stable.  IMPRESSION: No active cardiopulmonary disease.   Electronically Signed   By: Abelardo Diesel M.D.   On: 06/24/2013 07:30   Dg Shoulder Right  07/12/2013   CLINICAL DATA:  Right shoulder pain after getting shots and right arm yesterday.  EXAM: RIGHT SHOULDER - 2+ VIEW  COMPARISON:  None.  FINDINGS: There is no evidence of acute fracture or dislocation. Minimal spurring is noted at the margins of the glenoid and acromioclavicular joint. No soft tissue abnormality is identified. Right jugular dual-lumen central venous catheter is partially visualized.  IMPRESSION: No acute osseous abnormality seen.   Electronically Signed   By: Logan Bores   On: 07/12/2013 08:41   Ct Head Wo Contrast  06/25/2013   CLINICAL DATA:  73 year old female with generalized weakness and persistent shaking.  EXAM: CT HEAD WITHOUT CONTRAST  TECHNIQUE: Contiguous axial images were obtained from the base of the skull through the vertex without intravenous contrast.  COMPARISON:  08/16/2009 head CT  FINDINGS: No acute intracranial abnormalities are identified, including mass lesion or mass effect, hydrocephalus, extra-axial fluid collection, midline shift, hemorrhage, or acute infarction.  Bilateral basal ganglia calcifications are again noted.  The visualized bony calvarium is unremarkable.  IMPRESSION: No evidence of acute intracranial abnormality.   Electronically Signed   By: Hassan Rowan M.D.   On: 06/25/2013 19:18   Mr Brain Wo Contrast  07/19/2013   CLINICAL DATA:  Weakness and confusion over the past week. Dialysis patient.  EXAM: MRI HEAD WITHOUT CONTRAST   TECHNIQUE: Multiplanar, multiecho pulse sequences of the brain and surrounding structures were obtained without intravenous contrast.  COMPARISON:  06/25/2013 head CT.  11/15/2009 brain MR.  FINDINGS: Prominent small vessel disease type changes (progressive since 2011).  Scattered acute/ subacute infarcts throughout the supratentorial region bilaterally. Two of these tiny infarcts appear to be acute within the right frontal lobe and 1 infarct appears to be acute within the left frontal lobe whereas other infarcts appear subacute.  No intracranial hemorrhage.  Mild atrophy without hydrocephalus.  No intracranial mass lesion noted  on this unenhanced exam.  Major intracranial vascular structures appear patent.  Paranasal sinus mucosal thickening with partial opacification left sphenoid sinus air cell.  Exophthalmos.  Decreased signal intensity of bone marrow upper cervical spine may be related to anemia from dialysis.  IMPRESSION: Prominent small vessel disease type changes (progressive since 2011).  Scattered acute/ subacute infarcts throughout the supratentorial region bilaterally. Two of these tiny infarcts appear to be acute within the right frontal lobe and 1 infarct appears to be acute within the left frontal lobe whereas other infarcts appear subacute.  No intracranial hemorrhage.  Paranasal sinus mucosal thickening with partial opacification left sphenoid sinus air cell.  These results were called by telephone at the time of interpretation on 07/19/2013 at 6:55 PM to Dr. [name] , who verbally acknowledged these results.   Electronically Signed   By: Chauncey Cruel M.D.   On: 07/19/2013 19:02   Nm Gastric Emptying  06/30/2013   CLINICAL DATA:  Abdominal pain. Nausea and vomiting. Bloating. Reflux.  EXAM: NUCLEAR MEDICINE GASTRIC EMPTYING SCAN  TECHNIQUE: After oral ingestion of radiolabeled meal, sequential abdominal images were obtained for 120 minutes. Residual percentage of activity remaining within the  stomach was calculated at 60 and 120 minutes.  COMPARISON:  None.  RADIOPHARMACEUTICALS:  2 mCi Technetium 99-m labeled sulfur colloid  FINDINGS: Expected location of the stomach in the left upper quadrant. Ingested meal empties the stomach gradually over the course of the study with 100% retention at 60 min and 80% retention at 120 min (normal retention less than 30% at 120 min).  IMPRESSION: Markedly delayed gastric emptying consistent with gastroparesis.   Electronically Signed   By: Evangeline Dakin M.D.   On: 06/30/2013 18:38   Dg Chest Port 1 View  06/24/2013   CLINICAL DATA:  Postoperative study, rule out pneumothorax  EXAM: PORTABLE CHEST - 1 VIEW  COMPARISON:  PA and lateral chest x-ray of earlier today  FINDINGS: The patient has undergone interval placement of a large caliber dual-lumen dialysis type catheter via the right internal jugular approach. The tip of the catheter lies in the region of the junction of the SVC with the right atrium. There is no evidence of a postprocedure pneumothorax or hemothorax. Both lungs are clear. The cardiopericardial silhouette is mildly enlarged. The pulmonary vascularity is not engorged.  IMPRESSION: There is no evidence of a postprocedure complication following placement of the dialysis type catheter via the right internal jugular approach.   Electronically Signed   By: Iliyana Convey  [place]   On: 06/24/2013 09:16   Mr Jodene Nam Head/brain Wo Cm  07/21/2013   CLINICAL DATA:  Admitted with confusion.  EXAM: MRA HEAD WITHOUT CONTRAST  TECHNIQUE: Angiographic images of the Circle of Willis were obtained using MRA technique without intravenous contrast.  COMPARISON:  07/19/2013 MR brain. 11/15/2009 MR brain and MR angiogram.  FINDINGS: Anterior circulation without medium or large size vessel significant stenosis or occlusion.  Mild irregularity with very mild narrowing M1 segment middle cerebral artery bilaterally and A1 segment right anterior cerebral artery.  Middle cerebral artery  branch vessel irregularity and mild narrowing greater on the left.  No significant stenosis of the distal vertebral arteries or basilar artery. Mild irregularity with minimal narrowing distal left vertebral artery and proximal basilar artery.  Non visualization anterior inferior cerebellar artery bilaterally.  Mild narrowing and irregularity of the superior cerebellar artery bilaterally.  Mild to moderate narrowing of the P1 segment of the right posterior cerebral artery. Fetal type  contribution to the right posterior cerebral artery.  Mild narrowing and irregularity of posterior cerebral artery distal branches bilaterally.  No aneurysm noted.  Sphenoid sinus air cell opacification.  IMPRESSION: Intracranial atherosclerotic type changes predominantly involving branch vessels as detailed above.   Electronically Signed   By: Chauncey Cruel M.D.   On: 07/21/2013 12:06     Microbiology: No results found for this or any previous visit (from the past 240 hour(s)).   Labs: Basic Metabolic Panel:  Recent Labs Lab 07/20/13 0910 07/21/13 1248  NA 138 139  K 4.7 5.3  CL 99 101  CO2 25 23  GLUCOSE 140* 105*  BUN 35* 47*  CREATININE 6.43* 8.08*  CALCIUM 8.3* 8.5  PHOS  --  3.9   Liver Function Tests:  Recent Labs Lab 07/20/13 0910 07/21/13 1248  AST 16  --   ALT 16  --   ALKPHOS 88  --   BILITOT 0.4  --   PROT 7.1  --   ALBUMIN 2.9* 2.7*    Recent Labs Lab 07/20/13 0910  LIPASE 29   No results found for this basename: AMMONIA,  in the last 168 hours CBC:  Recent Labs Lab 07/20/13 0910 07/21/13 1248  WBC 12.3* 10.4  NEUTROABS 9.2*  --   HGB 7.9* 7.8*  HCT 23.4* 23.4*  MCV 86.0 86.3  PLT 194 208   Cardiac Enzymes: No results found for this basename: CKTOTAL, CKMB, CKMBINDEX, TROPONINI,  in the last 168 hours BNP: No components found with this basename: POCBNP,  CBG:  Recent Labs Lab 07/21/13 0640 07/21/13 1146 07/21/13 2201 07/22/13 0709 07/22/13 1121  GLUCAP 95  100* 244* 130* 210*    Time coordinating discharge:  Greater than 30 minutes  Signed:  Williams Dietrick, DO [facility] Pager: [phone] 07/22/2013, 3:39 PM

## 2013-07-27 NOTE — CV Procedure (Signed)
EP Procedure Note  Procedure: insertion of an ILR  Indication: cryptogenic stroke  Description of the procedure: After informed consent was obtained, the patient was prepped and draped in the usual sterile manner. 20 cc of lidocaine was infiltrated under subcutaneous space in the left pectoral region. A one cm stab incision was carried out and the Medtronic ILR was inserted under the skin. R waves were greater than 0.5 volts. Benzoin and steristrips were painted on the skin and a bandage was placed and the patient was returned to her room in satisfactory condition.  Complications: none immediately  Conclusion: successful insertion of a medtronic ILR in a patient with a cryptogenic stroke.   Mikle Bosworth.D.

## 2013-08-02 ENCOUNTER — Other Ambulatory Visit (HOSPITAL_COMMUNITY): Payer: Medicare Other

## 2013-08-02 ENCOUNTER — Ambulatory Visit: Payer: Medicare Other | Admitting: Vascular Surgery

## 2013-08-02 ENCOUNTER — Encounter (HOSPITAL_COMMUNITY): Payer: Medicare Other

## 2013-08-03 ENCOUNTER — Ambulatory Visit (INDEPENDENT_AMBULATORY_CARE_PROVIDER_SITE_OTHER): Payer: Medicare Other | Admitting: *Deleted

## 2013-08-03 DIAGNOSIS — I639 Cerebral infarction, unspecified: Secondary | ICD-10-CM

## 2013-08-03 DIAGNOSIS — I6350 Cerebral infarction due to unspecified occlusion or stenosis of unspecified cerebral artery: Secondary | ICD-10-CM

## 2013-08-04 LAB — MDC_IDC_ENUM_SESS_TYPE_INCLINIC
Date Time Interrogation Session: 20150218213853
MDC IDC SET ZONE DETECTION INTERVAL: 2000 ms
MDC IDC SET ZONE DETECTION INTERVAL: 3000 ms
Zone Setting Detection Interval: 380 ms

## 2013-08-05 NOTE — Progress Notes (Signed)
Wound check in clinic s/p linq insertion for cryptogenic CVA. Site well healed without redness or edema. Pt education completed about wound care and signs of infection.  Pt with 0 tachy episodes; 0 brady episodes; 0 asystole. Plan to follow up remotely QMO and with GT PRN.

## 2013-08-08 ENCOUNTER — Encounter: Payer: Self-pay | Admitting: Vascular Surgery

## 2013-08-09 ENCOUNTER — Ambulatory Visit: Payer: Medicare Other | Admitting: Vascular Surgery

## 2013-08-09 ENCOUNTER — Encounter (HOSPITAL_COMMUNITY): Payer: Medicare Other

## 2013-08-09 ENCOUNTER — Other Ambulatory Visit (HOSPITAL_COMMUNITY): Payer: Medicare Other

## 2013-08-12 ENCOUNTER — Encounter: Payer: Self-pay | Admitting: Internal Medicine

## 2013-08-18 ENCOUNTER — Ambulatory Visit (INDEPENDENT_AMBULATORY_CARE_PROVIDER_SITE_OTHER): Payer: Medicare Other | Admitting: *Deleted

## 2013-08-18 DIAGNOSIS — I639 Cerebral infarction, unspecified: Secondary | ICD-10-CM

## 2013-08-18 DIAGNOSIS — I6350 Cerebral infarction due to unspecified occlusion or stenosis of unspecified cerebral artery: Secondary | ICD-10-CM

## 2013-08-19 ENCOUNTER — Encounter (HOSPITAL_COMMUNITY): Payer: Self-pay | Admitting: Emergency Medicine

## 2013-08-19 ENCOUNTER — Emergency Department (HOSPITAL_COMMUNITY): Payer: Medicare Other

## 2013-08-19 ENCOUNTER — Inpatient Hospital Stay (HOSPITAL_COMMUNITY)
Admission: EM | Admit: 2013-08-19 | Discharge: 2013-08-23 | DRG: 064 | Disposition: A | Discharge status: Rehabilitation Facility | Payer: Medicare Other | Attending: Internal Medicine | Admitting: Internal Medicine

## 2013-08-19 DIAGNOSIS — I639 Cerebral infarction, unspecified: Secondary | ICD-10-CM

## 2013-08-19 DIAGNOSIS — R5383 Other fatigue: Secondary | ICD-10-CM

## 2013-08-19 DIAGNOSIS — R609 Edema, unspecified: Secondary | ICD-10-CM

## 2013-08-19 DIAGNOSIS — E785 Hyperlipidemia, unspecified: Secondary | ICD-10-CM | POA: Diagnosis present

## 2013-08-19 DIAGNOSIS — G2581 Restless legs syndrome: Secondary | ICD-10-CM

## 2013-08-19 DIAGNOSIS — Z992 Dependence on renal dialysis: Secondary | ICD-10-CM

## 2013-08-19 DIAGNOSIS — Z7982 Long term (current) use of aspirin: Secondary | ICD-10-CM

## 2013-08-19 DIAGNOSIS — I959 Hypotension, unspecified: Secondary | ICD-10-CM | POA: Diagnosis present

## 2013-08-19 DIAGNOSIS — E1149 Type 2 diabetes mellitus with other diabetic neurological complication: Secondary | ICD-10-CM

## 2013-08-19 DIAGNOSIS — I6350 Cerebral infarction due to unspecified occlusion or stenosis of unspecified cerebral artery: Principal | ICD-10-CM | POA: Diagnosis present

## 2013-08-19 DIAGNOSIS — G8190 Hemiplegia, unspecified affecting unspecified side: Secondary | ICD-10-CM | POA: Diagnosis present

## 2013-08-19 DIAGNOSIS — Z8673 Personal history of transient ischemic attack (TIA), and cerebral infarction without residual deficits: Secondary | ICD-10-CM

## 2013-08-19 DIAGNOSIS — D7289 Other specified disorders of white blood cells: Secondary | ICD-10-CM

## 2013-08-19 DIAGNOSIS — E1159 Type 2 diabetes mellitus with other circulatory complications: Secondary | ICD-10-CM | POA: Diagnosis present

## 2013-08-19 DIAGNOSIS — Z794 Long term (current) use of insulin: Secondary | ICD-10-CM

## 2013-08-19 DIAGNOSIS — K3184 Gastroparesis: Secondary | ICD-10-CM | POA: Diagnosis present

## 2013-08-19 DIAGNOSIS — G8191 Hemiplegia, unspecified affecting right dominant side: Secondary | ICD-10-CM | POA: Diagnosis present

## 2013-08-19 DIAGNOSIS — M949 Disorder of cartilage, unspecified: Secondary | ICD-10-CM

## 2013-08-19 DIAGNOSIS — I120 Hypertensive chronic kidney disease with stage 5 chronic kidney disease or end stage renal disease: Secondary | ICD-10-CM | POA: Diagnosis present

## 2013-08-19 DIAGNOSIS — I498 Other specified cardiac arrhythmias: Secondary | ICD-10-CM

## 2013-08-19 DIAGNOSIS — E119 Type 2 diabetes mellitus without complications: Secondary | ICD-10-CM | POA: Diagnosis present

## 2013-08-19 DIAGNOSIS — D649 Anemia, unspecified: Secondary | ICD-10-CM | POA: Diagnosis present

## 2013-08-19 DIAGNOSIS — R531 Weakness: Secondary | ICD-10-CM

## 2013-08-19 DIAGNOSIS — N186 End stage renal disease: Secondary | ICD-10-CM | POA: Diagnosis present

## 2013-08-19 DIAGNOSIS — R5381 Other malaise: Secondary | ICD-10-CM

## 2013-08-19 DIAGNOSIS — E1143 Type 2 diabetes mellitus with diabetic autonomic (poly)neuropathy: Secondary | ICD-10-CM

## 2013-08-19 DIAGNOSIS — I10 Essential (primary) hypertension: Secondary | ICD-10-CM | POA: Diagnosis present

## 2013-08-19 DIAGNOSIS — M899 Disorder of bone, unspecified: Secondary | ICD-10-CM | POA: Diagnosis present

## 2013-08-19 DIAGNOSIS — G609 Hereditary and idiopathic neuropathy, unspecified: Secondary | ICD-10-CM

## 2013-08-19 DIAGNOSIS — Z79899 Other long term (current) drug therapy: Secondary | ICD-10-CM

## 2013-08-19 DIAGNOSIS — N2581 Secondary hyperparathyroidism of renal origin: Secondary | ICD-10-CM | POA: Diagnosis present

## 2013-08-19 DIAGNOSIS — Z9849 Cataract extraction status, unspecified eye: Secondary | ICD-10-CM

## 2013-08-19 LAB — URINE MICROSCOPIC-ADD ON

## 2013-08-19 LAB — BASIC METABOLIC PANEL
BUN: 28 mg/dL — AB (ref 6–23)
CHLORIDE: 103 meq/L (ref 96–112)
CO2: 25 mEq/L (ref 19–32)
Calcium: 8.9 mg/dL (ref 8.4–10.5)
Creatinine, Ser: 5.03 mg/dL — ABNORMAL HIGH (ref 0.50–1.10)
GFR calc Af Amer: 9 mL/min — ABNORMAL LOW (ref >90)
GFR, EST NON AFRICAN AMERICAN: 8 mL/min — AB (ref >90)
GLUCOSE: 165 mg/dL — AB (ref 70–99)
Potassium: 4.3 mEq/L (ref 3.7–5.3)
Sodium: 142 mEq/L (ref 137–147)

## 2013-08-19 LAB — URINALYSIS, ROUTINE W REFLEX MICROSCOPIC
Glucose, UA: 250 mg/dL — AB
HGB URINE DIPSTICK: NEGATIVE
KETONES UR: NEGATIVE mg/dL
Leukocytes, UA: NEGATIVE
Nitrite: NEGATIVE
PH: 6 (ref 5.0–8.0)
Protein, ur: >300 mg/dL — AB
SPECIFIC GRAVITY, URINE: 1.022 (ref 1.005–1.030)
Urobilinogen, UA: 0.2 mg/dL (ref 0.0–1.0)

## 2013-08-19 LAB — CBC
HEMATOCRIT: 28.8 % — AB (ref 36.0–46.0)
Hemoglobin: 9.6 g/dL — ABNORMAL LOW (ref 12.0–15.0)
MCH: 28.9 pg (ref 26.0–34.0)
MCHC: 33.3 g/dL (ref 30.0–36.0)
MCV: 86.7 fL (ref 78.0–100.0)
Platelets: 208 10*3/uL (ref 150–400)
RBC: 3.32 MIL/uL — ABNORMAL LOW (ref 3.87–5.11)
RDW: 17.7 % — ABNORMAL HIGH (ref 11.5–15.5)
WBC: 10.1 10*3/uL (ref 4.0–10.5)

## 2013-08-19 LAB — AMMONIA: AMMONIA: 28 umol/L (ref 11–60)

## 2013-08-19 LAB — CBG MONITORING, ED: Glucose-Capillary: 156 mg/dL — ABNORMAL HIGH (ref 70–99)

## 2013-08-19 LAB — TROPONIN I

## 2013-08-19 LAB — GLUCOSE, CAPILLARY: GLUCOSE-CAPILLARY: 151 mg/dL — AB (ref 70–99)

## 2013-08-19 MED ORDER — ERYTHROMYCIN BASE 250 MG PO TBEC
250.0000 mg | DELAYED_RELEASE_TABLET | Freq: Three times a day (TID) | ORAL | Status: DC
  Administered 2013-08-19 – 2013-08-22 (×7): 250 mg via ORAL
  Filled 2013-08-19 (×19): qty 1

## 2013-08-19 MED ORDER — INSULIN NPH (HUMAN) (ISOPHANE) 100 UNIT/ML ~~LOC~~ SUSP
5.0000 [IU] | Freq: Two times a day (BID) | SUBCUTANEOUS | Status: DC
Start: 2013-08-19 — End: 2013-08-23
  Administered 2013-08-19 – 2013-08-23 (×7): 5 [IU] via SUBCUTANEOUS
  Filled 2013-08-19 (×3): qty 10

## 2013-08-19 MED ORDER — HEPARIN SODIUM (PORCINE) 5000 UNIT/ML IJ SOLN
5000.0000 [IU] | Freq: Three times a day (TID) | INTRAMUSCULAR | Status: DC
  Administered 2013-08-19 – 2013-08-23 (×11): 5000 [IU] via SUBCUTANEOUS
  Filled 2013-08-19 (×15): qty 1

## 2013-08-19 MED ORDER — RENA-VITE PO TABS
1.0000 | ORAL_TABLET | Freq: Every day | ORAL | Status: DC
  Administered 2013-08-19 – 2013-08-22 (×4): 1 via ORAL
  Filled 2013-08-19 (×6): qty 1

## 2013-08-19 MED ORDER — CLOPIDOGREL BISULFATE 75 MG PO TABS
75.0000 mg | ORAL_TABLET | Freq: Every day | ORAL | Status: DC
  Administered 2013-08-19 – 2013-08-23 (×5): 75 mg via ORAL
  Filled 2013-08-19 (×5): qty 1

## 2013-08-19 MED ORDER — ACETAMINOPHEN 650 MG RE SUPP: 650.0000 mg | Freq: Four times a day (QID) | RECTAL | Status: DC | PRN

## 2013-08-19 MED ORDER — ACETAMINOPHEN 325 MG PO TABS: 650.0000 mg | ORAL_TABLET | Freq: Four times a day (QID) | ORAL | Status: DC | PRN

## 2013-08-19 MED ORDER — ONDANSETRON HCL 4 MG/2ML IJ SOLN
4.0000 mg | Freq: Four times a day (QID) | INTRAMUSCULAR | Status: DC | PRN
  Administered 2013-08-19: 4 mg via INTRAVENOUS
  Filled 2013-08-19: qty 2

## 2013-08-19 MED ORDER — INSULIN ASPART 100 UNIT/ML ~~LOC~~ SOLN
0.0000 [IU] | Freq: Three times a day (TID) | SUBCUTANEOUS | Status: DC
  Administered 2013-08-20 (×3): 1 [IU] via SUBCUTANEOUS
  Administered 2013-08-21: 2 [IU] via SUBCUTANEOUS
  Administered 2013-08-22: 3 [IU] via SUBCUTANEOUS
  Administered 2013-08-22 – 2013-08-23 (×4): 1 [IU] via SUBCUTANEOUS

## 2013-08-19 MED ORDER — PANTOPRAZOLE SODIUM 40 MG PO TBEC
40.0000 mg | DELAYED_RELEASE_TABLET | Freq: Every day | ORAL | Status: DC
  Administered 2013-08-19 – 2013-08-20 (×2): 40 mg via ORAL
  Filled 2013-08-19 (×2): qty 1

## 2013-08-19 MED ORDER — SODIUM CHLORIDE 0.9 % IJ SOLN
3.0000 mL | Freq: Two times a day (BID) | INTRAMUSCULAR | Status: DC
  Administered 2013-08-19 – 2013-08-22 (×7): 3 mL via INTRAVENOUS

## 2013-08-19 MED ORDER — CALCIUM CARBONATE ANTACID 500 MG PO CHEW
1.0000 | CHEWABLE_TABLET | Freq: Three times a day (TID) | ORAL | Status: DC
  Administered 2013-08-20 – 2013-08-23 (×10): 200 mg via ORAL
  Filled 2013-08-19 (×13): qty 1

## 2013-08-19 MED ORDER — ONDANSETRON HCL 4 MG PO TABS: 4.0000 mg | ORAL_TABLET | Freq: Four times a day (QID) | ORAL | Status: DC | PRN

## 2013-08-19 NOTE — ED Notes (Signed)
IV team at bedside 

## 2013-08-19 NOTE — ED Notes (Signed)
Attempted IV start x2 unscuccessfully, IV team paged

## 2013-08-19 NOTE — ED Notes (Addendum)
Attempted to ambulate pt with walker, pt able to stand with two staff assist, unable to walk. Dr. Maryan Rued made aware.

## 2013-08-19 NOTE — ED Notes (Signed)
Saa, Phlebotomist at bedside to attempt lab draw.

## 2013-08-19 NOTE — Consult Note (Signed)
Referring Physician: TAT    Chief Complaint: possible CVA  HPI:                                                                                                                                         [name] is an 73 y.o. female who was recently seen at [facility] for bilateral frontal CVA.  During that visit source of CVA was not found and patient was changed from 81 mg ASA to 325 mg.  Per family and notes the patient's systolic blood pressure has been elevated up to 200 range. She recently saw her primary care physician on 08/18/2013. The patient was restarted on minoxidil in addition to her labetalol. This AM she was found by her daughter to be diaphoretic, AMS, limp right arm.  Patient was brought to the ED where her BP was 111/49 (significantly lower than her normal 200's).  Currently she remains confused, unable to follow verbal or visual commands consistently.  Neurology was consulted for possible stroke.   Date last known well: Date: 08/19/2013 Time last known well: Unable to determine tPA Given: No: recent stroke and unknown LSN  Past Medical History  Diagnosis Date  . Hypertension   . Cataract   . Diabetes mellitus   . Chronic kidney disease   . Shortness of breath   . Anemia   . CVA (cerebral infarction)     Past Surgical History  Procedure Laterality Date  . No past surgeries    . Cataract extraction w/phaco  11/20/2011    Procedure: CATARACT EXTRACTION PHACO AND INTRAOCULAR LENS PLACEMENT (IOC);  Surgeon: Tonny Branch, MD;  Location: [facility];  Service: Ophthalmology;  Laterality: Right;  CDE 18.82  . Cataract extraction w/phaco Left 11/18/2012    Procedure: CATARACT EXTRACTION PHACO AND INTRAOCULAR LENS PLACEMENT (IOC);  Surgeon: Tonny Branch, MD;  Location: [facility];  Service: Ophthalmology;  Laterality: Left;  CDE: 18.97  . Colonoscopy N/A 02/09/2013    Procedure: COLONOSCOPY;  Surgeon: Rogene Houston, MD;  Location: [facility];  Service: Endoscopy;  Laterality:  N/A;  305-moved to 220 Ann to notify pt  . Insertion of dialysis catheter Right 06/24/2013    Procedure: INSERTION OF DIALYSIS CATHETER: Ultrasound guided;  Surgeon: Serafina Mitchell, MD;  Location: [place];  Service: Vascular;  Laterality: Right;  . 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 Tee without cardioversion N/A 07/21/2013    Procedure: TRANSESOPHAGEAL ECHOCARDIOGRAM (TEE);  Surgeon: Dorothy Spark, MD;  Location: [facility];  Service: Cardiovascular;  Laterality: N/A;    Family History  Problem Relation Age of Onset  . Anesthesia problems Neg Hx   . Hypotension Neg Hx   . Malignant hyperthermia Neg Hx   . Pseudochol deficiency Neg Hx   . Cancer Sister   . Cancer Brother  Social History:  reports that she has never smoked. She has never used smokeless tobacco. She reports that she does not drink alcohol or use illicit drugs.  Allergies:  Allergies  Allergen Reactions  . Ambien [Zolpidem] Other (See Comments)    Hallucinations   . Reglan [Metoclopramide] Other (See Comments)    Hallucinations     Medications:                                                                                                                          No current facility-administered medications for this encounter.   Current Outpatient Prescriptions  Medication Sig Dispense Refill  . acetaminophen (TYLENOL) 325 MG tablet Take 650 mg by mouth every 6 (six) hours as needed (pain).      Marland Kitchen aspirin 325 MG tablet Take 325 mg by mouth daily.      . calcium carbonate (TUMS - DOSED IN MG ELEMENTAL CALCIUM) 500 MG chewable tablet Chew 1 tablet by mouth 3 (three) times daily with meals.      . Cyanocobalamin (VITAMIN B-12 IJ) Inject 1 application as directed every 30 (thirty) days.      . insulin NPH Human (HUMULIN N,NOVOLIN N) 100 UNIT/ML injection Inject 10 Units into the skin 2 (two) times daily at 8 am and 10 pm.  10 mL  1  . labetalol (NORMODYNE) 200  MG tablet Take 200 mg by mouth 2 (two) times daily.      . multivitamin (RENA-VIT) TABS tablet Take 1 tablet by mouth at bedtime.  30 tablet  0  . pantoprazole (PROTONIX) 40 MG tablet Take 1 tablet (40 mg total) by mouth daily with supper.  30 tablet  0  . sodium chloride 0.9 % SOLN 100 mL with ferric gluconate 12.5 MG/ML SOLN 125 mg Inject 125 mg into the vein every Monday, Wednesday, and Friday with hemodialysis.         ROS:                                                                                                                                       History obtained from daughter  General ROS: negative for - chills, fatigue, fever, night sweats, weight gain or weight loss Psychological ROS: negative for - behavioral disorder, hallucinations, memory difficulties, mood swings or suicidal ideation Ophthalmic ROS: negative for - blurry vision, double vision, eye  pain or loss of vision ENT ROS: negative for - epistaxis, nasal discharge, oral lesions, sore throat, tinnitus or vertigo Allergy and Immunology ROS: negative for - hives or itchy/watery eyes Hematological and Lymphatic ROS: negative for - bleeding problems, bruising or swollen lymph nodes Endocrine ROS: negative for - galactorrhea, hair pattern changes, polydipsia/polyuria or temperature intolerance Respiratory ROS: negative for - cough, hemoptysis, shortness of breath or wheezing Cardiovascular ROS: negative for - chest pain, dyspnea on exertion, edema or irregular heartbeat Gastrointestinal ROS: negative for - abdominal pain, diarrhea, hematemesis, nausea/vomiting or stool incontinence Genito-Urinary ROS: negative for - dysuria, hematuria, incontinence or urinary frequency/urgency Musculoskeletal ROS: negative for - joint swelling or muscular weakness Neurological ROS: as noted in HPI Dermatological ROS: negative for rash and skin lesion changes  Neurologic Examination:                                                                                                       Blood pressure 114/56, pulse 94, temperature 98.3 F (36.8 C), temperature source Oral, resp. rate 17, SpO2 98.00%.   Mental Status: Alert, confused, tend to repeat what i am asking her. Speech fluent without evidence of aphasia.  Not able to follow 3 step commands without difficulty. Cranial Nerves: II: Discs flat bilaterally; possible right hemianopia but difficult to assess as patient does not blink to threat bilaterally and will randomly call out numbers of fingers.  She does seem to see my fingers come into field on the left side. , pupils equal, round, reactive to light and accommodation III,IV, VI: ptosis not present, extra-ocular motions intact bilaterally V,VII: smile symmetric, facial light touch sensation normal bilaterally VIII: hearing normal bilaterally IX,X: gag reflex present XI: bilateral shoulder shrug XII: midline tongue extension without atrophy or fasciculations  Motor: Right : Upper extremity   5/5(note)   Left:     Upper extremity   5/5  Lower extremity   4/5     Lower extremity   5/5 --weak on the tricep extension Tone and bulk:normal tone throughout; no atrophy noted Sensory: Pinprick and light touch intact throughout, bilaterally Deep Tendon Reflexes:  Right: Upper Extremity   Left: Upper extremity   biceps (C-5 to C-6) 2/4   biceps (C-5 to C-6) 2/4 tricep (C7) 2/4    triceps (C7) 2/4 Brachioradialis (C6) 2/4  Brachioradialis (C6) 2/4  Lower Extremity Lower Extremity  quadriceps (L-2 to L-4) 2/4   quadriceps (L-2 to L-4) 2/4 Achilles (S1) 2/4   Achilles (S1) 2/4  Plantars: Right: downgoing   Left: downgoing Cerebellar: Unable to assess Gait: unable to assess CV: pulses palpable throughout    Lab Results: Basic Metabolic Panel:  Recent Labs Lab 08/19/13 1210  NA 142  K 4.3  CL 103  CO2 25  GLUCOSE 165*  BUN 28*  CREATININE 5.03*  CALCIUM 8.9    Liver Function Tests: No results found for  this basename: AST, ALT, ALKPHOS, BILITOT, PROT, ALBUMIN,  in the last 168 hours No results found for this basename: LIPASE, AMYLASE,  in the last 168 hours No  results found for this basename: AMMONIA,  in the last 168 hours  CBC:  Recent Labs Lab 08/19/13 1210  WBC 10.1  HGB 9.6*  HCT 28.8*  MCV 86.7  PLT 208    Cardiac Enzymes: No results found for this basename: CKTOTAL, CKMB, CKMBINDEX, TROPONINI,  in the last 168 hours  Lipid Panel: No results found for this basename: CHOL, TRIG, HDL, CHOLHDL, VLDL, LDLCALC,  in the last 168 hours  CBG:  Recent Labs Lab 08/19/13 [place] 156*    Microbiology: Results for orders placed during the hospital encounter of 06/25/13  MRSA PCR SCREENING     Status: None   Collection Time    06/26/13  6:27 AM      Result Value Ref Range Status   MRSA by PCR NEGATIVE  NEGATIVE Final   Comment:            The GeneXpert MRSA Assay (FDA     approved for NASAL specimens     only), is one component of a     comprehensive MRSA colonization     surveillance program. It is not     intended to diagnose MRSA     infection nor to guide or     monitor treatment for     MRSA infections.    Coagulation Studies: No results found for this basename: LABPROT, INR,  in the last 72 hours  Imaging: Dg Chest 2 View  08/19/2013   CLINICAL DATA:  Weakness, confusion.  EXAM: CHEST  2 VIEW  COMPARISON:  07/20/2013  FINDINGS: Mild cardiomegaly. Vascular congestion. Bibasilar airspace opacities likely reflect atelectasis. Suspect small layering effusions. Right dialysis catheter is in place with the tip in the right atrium, unchanged. No acute bony abnormality.  IMPRESSION: Cardiomegaly with vascular congestion.  Small bilateral effusions with bibasilar densities, likely atelectasis.   Electronically Signed   By: Rolm Baptise M.D.   On: 08/19/2013 13:01   Ct Head (brain) Wo Contrast  08/19/2013   CLINICAL DATA:  73 year old with weakness. Patient was unable  to walk.  EXAM: CT HEAD WITHOUT CONTRAST  TECHNIQUE: Contiguous axial images were obtained from the base of the skull through the vertex without contrast.  COMPARISON:  06/25/2013  FINDINGS: Chronic calcifications in the basal ganglia. No evidence for acute hemorrhage, mass lesion, midline shift, hydrocephalus or large infarct. Chronic low-density in the periventricular and subcortical white matter, particularly in the parietal lobes. Visualized sinuses are clear. No acute bone abnormality.  IMPRESSION: No acute intracranial abnormality.  White matter changes are suggestive for chronic small vessel ischemic disease.   Electronically Signed   By: Markus Daft M.D.   On: 08/19/2013 14:00   TEE 07/21/2013 no source of embolus, no PFO  Carotid doppler 07/20/13 No significant ICA stenosis detected. Vertebral artery flow antegrade.  2 D echo 06/26/13 Study Conclusions  - Left ventricle: The cavity size was normal. Wall thickness was increased in a pattern of mild LVH. Systolic function was vigorous. The estimated ejection fraction was in the range of 65% to 70%. Wall motion was normal; there were no regional wall motion abnormalities. Features are consistent with a pseudonormal left ventricular filling pattern, with concomitant abnormal relaxation and increased filling pressure (grade 2 diastolic dysfunction).  A1c 07/21/13--7.1 LDL 07/21/13 --68  Assessment and plan discussed with with attending physician and they are in agreement.    Etta Quill PA-C [facility] [phone]  08/19/2013, 4:23 PM   Assessment: 73 y.o. female with  sudden drop in normal BP presenting with confusion and transient right arm weakness.  Given recent stroke and drop in BP cannot rule out new acute CVA secondary to hypotension.   Stroke Risk Factors - diabetes mellitus and hypertension  Recommend: 1) MRI brain -will make further recommendations after MRI brain.  2) PT and OT consults 3) Would continue aspirin  325 mg per day for now 4) Maintain systolic blood pressure between 120 and 160.  I personally participated in this patient's evaluation and management, including formulating the above clinical assessment and management recommendations.  Rush Farmer M.D. [facility] [phone]

## 2013-08-19 NOTE — ED Notes (Signed)
Neuro at bedside.

## 2013-08-19 NOTE — ED Provider Notes (Addendum)
CSN: [id number]     Arrival date & time 08/19/13  1025 History   First MD Initiated Contact with Patient 08/19/13 1112     Chief Complaint  Patient presents with  . Weakness     (Consider location/radiation/quality/duration/timing/severity/associated sxs/prior Treatment) Patient is a 73 y.o. female presenting with weakness. The history is provided by the patient and a caregiver.  Weakness This is a new problem. Episode onset: has been constant since waking up.  she was fine yesterday before bed. The problem occurs constantly. The problem has been gradually improving. Associated symptoms include headaches. Pertinent negatives include no chest pain, no abdominal pain and no shortness of breath. Associated symptoms comments: Generalized weakness but more pronounced on the right arm and leg.  No abd pain.  1 episode of vomiting this morning which has passed.  No nausea now.. The symptoms are aggravated by walking. Nothing relieves the symptoms. She has tried nothing for the symptoms. The treatment provided mild relief.    Past Medical History  Diagnosis Date  . Hypertension   . Cataract   . Diabetes mellitus   . Chronic kidney disease   . Shortness of breath   . Anemia   . CVA (cerebral infarction)    Past Surgical History  Procedure Laterality Date  . No past surgeries    . Cataract extraction w/phaco  11/20/2011    Procedure: CATARACT EXTRACTION PHACO AND INTRAOCULAR LENS PLACEMENT (IOC);  Surgeon: Tonny Branch, MD;  Location: [facility];  Service: Ophthalmology;  Laterality: Right;  CDE 18.82  . Cataract extraction w/phaco Left 11/18/2012    Procedure: CATARACT EXTRACTION PHACO AND INTRAOCULAR LENS PLACEMENT (IOC);  Surgeon: Tonny Branch, MD;  Location: [facility];  Service: Ophthalmology;  Laterality: Left;  CDE: 18.97  . Colonoscopy N/A 02/09/2013    Procedure: COLONOSCOPY;  Surgeon: Rogene Houston, MD;  [facility];  Service: Endoscopy;  Laterality: N/A;  305-moved to 220 Ann to notify  pt  . Insertion of dialysis catheter Right 06/24/2013    Procedure: INSERTION OF DIALYSIS CATHETER: Ultrasound guided;  Surgeon: Serafina Mitchell, MD;  Location: [facility];  Service: Vascular;  Laterality: Right;  . 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 Tee without cardioversion N/A 07/21/2013    Procedure: TRANSESOPHAGEAL ECHOCARDIOGRAM (TEE);  Surgeon: Dorothy Spark, MD;  Location: [facility];  Service: Cardiovascular;  Laterality: N/A;   Family History  Problem Relation Age of Onset  . Anesthesia problems Neg Hx   . Hypotension Neg Hx   . Malignant hyperthermia Neg Hx   . Pseudochol deficiency Neg Hx   . Cancer Sister   . Cancer Brother    History  Substance Use Topics  . Smoking status: Never Smoker   . Smokeless tobacco: Never Used  . Alcohol Use: No   OB History   Grav Para Term Preterm Abortions TAB SAB Ect Mult Living                 Review of Systems  Eyes: Positive for visual disturbance.       Feels vision is more blurry than normal.  Respiratory: Negative for shortness of breath.   Cardiovascular: Negative for chest pain.  Gastrointestinal: Negative for abdominal pain.  Neurological: Positive for weakness and headaches.       States headache is worse the usual  All other systems reviewed and are negative.      Allergies  Ambien and Reglan  Home Medications   Current Outpatient Rx  Name  Route  Sig  Dispense  Refill  . acetaminophen (TYLENOL) 325 MG tablet   Oral   Take 650 mg by mouth every 6 (six) hours as needed (pain).         Marland Kitchen aspirin 325 MG tablet   Oral   Take 325 mg by mouth daily.         . calcium carbonate (TUMS - DOSED IN MG ELEMENTAL CALCIUM) 500 MG chewable tablet   Oral   Chew 1 tablet by mouth 3 (three) times daily with meals.         . Cyanocobalamin (VITAMIN B-12 IJ)   Injection   Inject 1 application as directed every 30 (thirty) days.         . insulin NPH Human  (HUMULIN N,NOVOLIN N) 100 UNIT/ML injection   Subcutaneous   Inject 10 Units into the skin 2 (two) times daily at 8 am and 10 pm.   10 mL   1   . labetalol (NORMODYNE) 200 MG tablet   Oral   Take 200 mg by mouth 2 (two) times daily.         . multivitamin (RENA-VIT) TABS tablet   Oral   Take 1 tablet by mouth at bedtime.   30 tablet   0   . pantoprazole (PROTONIX) 40 MG tablet   Oral   Take 1 tablet (40 mg total) by mouth daily with supper.   30 tablet   0   . sodium chloride 0.9 % SOLN 100 mL with ferric gluconate 12.5 MG/ML SOLN 125 mg   Intravenous   Inject 125 mg into the vein every Monday, Wednesday, and Friday with hemodialysis.          BP 122/59  Pulse 73  Temp(Src) 98.9 F (37.2 C) (Oral)  Resp 15  SpO2 91% Physical Exam  Nursing note and vitals reviewed. Constitutional: She is oriented to person, place, and time. She appears well-developed and well-nourished. No distress.  HENT:  Head: Normocephalic and atraumatic.  Right Ear: Tympanic membrane and ear canal normal.  Left Ear: Tympanic membrane and ear canal normal.  Mouth/Throat: Oropharynx is clear and moist.  Eyes: Conjunctivae and EOM are normal. Pupils are equal, round, and reactive to light.  Neck: Normal range of motion. Neck supple.  Cardiovascular: Normal rate, regular rhythm and intact distal pulses.   No murmur heard. Pulmonary/Chest: Effort normal and breath sounds normal. No respiratory distress. She has no wheezes. She has no rales.  Dialysis catheter in the right upper chest.  C/D/I  Abdominal: Soft. She exhibits no distension. There is no tenderness. There is no rebound and no guarding.  Musculoskeletal: Normal range of motion. She exhibits no edema and no tenderness.  Neurological: She is alert and oriented to person, place, and time. She has normal strength. No cranial nerve deficit or sensory deficit. Coordination normal.  4/5 strength in the right upper and lower ext.  5/5 strength  in the left upper/lower ext.  Normal finger to nose testing.  Possible visual field cut in the left upper lateral eye.  Normal speech  Skin: Skin is warm and dry. No rash noted. No erythema.  Psychiatric: She has a normal mood and affect. Her behavior is normal.    ED Course  Procedures (including critical care time) Labs Review Labs Reviewed  CBC - Abnormal; Notable for the following:    RBC 3.32 (*)  Hemoglobin 9.6 (*)    HCT 28.8 (*)    RDW 17.7 (*)    All other components within normal limits  BASIC METABOLIC PANEL - Abnormal; Notable for the following:    Glucose, Bld 165 (*)    BUN 28 (*)    Creatinine, Ser 5.03 (*)    GFR calc non Af Amer 8 (*)    GFR calc Af Amer 9 (*)    All other components within normal limits  URINALYSIS, ROUTINE W REFLEX MICROSCOPIC - Abnormal; Notable for the following:    APPearance CLOUDY (*)    Glucose, UA 250 (*)    Bilirubin Urine SMALL (*)    Protein, ur >300 (*)    All other components within normal limits  URINE MICROSCOPIC-ADD ON - Abnormal; Notable for the following:    Bacteria, UA FEW (*)    All other components within normal limits  CBG MONITORING, ED - Abnormal; Notable for the following:    Glucose-Capillary 156 (*)    All other components within normal limits  I-STAT TROPOININ, ED   Imaging Review Dg Chest 2 View  08/19/2013   CLINICAL DATA:  Weakness, confusion.  EXAM: CHEST  2 VIEW  COMPARISON:  07/20/2013  FINDINGS: Mild cardiomegaly. Vascular congestion. Bibasilar airspace opacities likely reflect atelectasis. Suspect small layering effusions. Right dialysis catheter is in place with the tip in the right atrium, unchanged. No acute bony abnormality.  IMPRESSION: Cardiomegaly with vascular congestion.  Small bilateral effusions with bibasilar densities, likely atelectasis.   Electronically Signed   By: Rolm Baptise M.D.   On: 08/19/2013 13:01   Ct Head (brain) Wo Contrast  08/19/2013   CLINICAL DATA:  73 year old with  weakness. Patient was unable to walk.  EXAM: CT HEAD WITHOUT CONTRAST  TECHNIQUE: Contiguous axial images were obtained from the base of the skull through the vertex without contrast.  COMPARISON:  06/25/2013  FINDINGS: Chronic calcifications in the basal ganglia. No evidence for acute hemorrhage, mass lesion, midline shift, hydrocephalus or large infarct. Chronic low-density in the periventricular and subcortical white matter, particularly in the parietal lobes. Visualized sinuses are clear. No acute bone abnormality.  IMPRESSION: No acute intracranial abnormality.  White matter changes are suggestive for chronic small vessel ischemic disease.   Electronically Signed   By: Markus Daft M.D.   On: 08/19/2013 14:00     EKG Interpretation   Date/Time:  Friday August 19 2013 10:59:53 EST Ventricular Rate:  89 PR Interval:  136 QRS Duration: 66 QT Interval:  390 QTC Calculation: 474 R Axis:   14 Text Interpretation:  Normal sinus rhythm Nonspecific ST and T wave  abnormality Prolonged QT No significant change since last tracing  Confirmed by Maryan Rued  MD, Loree Fee ([id number]) on 08/19/2013 11:14:06 AM      MDM   Final diagnoses:  Weakness  Hypotension    Patient presents with generalized weakness however more localized right upper and lower extremity weakness starting today when she woke up. Family states she had one episode of vomiting and she's been complaining of a headache today. Patient is on dialysis for end-stage renal disease last course of dialysis was on Wednesday. States she saw her PCP yesterday due to persistent high blood pressure and was restarted on her blood pressure medication that she took for the first time last night. Today patient's blood pressure is significantly lower than prior. Apparently in the office in dialysis she was running greater than [id number] systolics in today she is  between 122 and 109. She is awake alert and very pleasant and able to follow commands. She is tenderness.  However on exam notable for decreased strength in the right upper and lower extremity 4/5 compared to 5 out of 5 on the left. Difficult to detect visual field cuts due to cataracts but possible visual field cut in the left lateral eye.  She denies any urinary or respiratory symptoms. Concern for stroke versus hypotension causing generalized weakness versus underlying infection.   EKG is unchanged.  Chest x-ray, head CT, CBCs, BMP, UA pending.  BS is 150.  2:20 PM CXR and head CT without acute findings.  Pt had CVA work up 1 month ago without cause for new infarct.  Pt has no signs of infection or electrolyte abnormalities today.  Feel most likely weakness is a result of lower blood pressure from minoxidil.  Pt states she feels better here.  Will see if she can walk.  Blanchie Dessert, MD 08/19/13 1432  Blanchie Dessert, MD 08/19/13 [phone]

## 2013-08-19 NOTE — H&P (Signed)
[facility] History and Physical  [name] [id number] DOB: [birth year] DOA: 08/19/2013   PCP: [name], MD   Chief Complaint: Generalized weakness, right leg weakness  HPI:  73 year old female with a history of hypertension, diabetes mellitus, ESRD, on dialysis, recent stroke presents with  generalized weakness as well as right lower extremity weakness when she woke up this morning. Normally the patient is able to perform all her activities of daily living. However this morning she was too weak to get up out of bed. The patient had a full dialysis session on 08/17/2013., But she missed her dialysis today as a result coming to the emergency department. The patient's family is at the bedside to supplement history. The patient is a poor historian. The patient's family has been told that over the last 2-3 dialysis sessions, the patient's systolic blood pressure has been elevated up to 200 range. He took the patient to see her primary care physician on 08/18/2013. The patient was restarted on minoxidil in addition to her labetalol. Upon arrival to the ED, the patient's pressure was 111/49. This morning, the patient needed significant assistance to get out of bed from her family. She was noted to be diaphoretic, but she denies any chest discomfort. She complains of some shortness breath with exertion. There has been a nonproductive cough for the past week. The patient denies any fevers, chills. She had one episode of nausea and vomiting prior to arrival without any hematemesis. She does have some dyspnea on exertion without any PND orthopnea. Patient also had a headache this morning which has resolved. In addition, she feels that her vision is worse than usual.   In the ED, the patient was noted to have blood pressure 111/49. CBC and BMP are essentially unremarkable except for serum creatinine of 5.03 and this ESRD patient. Urinalysis did not show any pyuria. CT brain was negative for any  acute findings. Chest x-ray was positive for small bilateral pleural effusions and vascular congestion.  Assessment/Plan: right lower extremity weakness with generalized weakness  -I am concerned that the patient's blood pressure has been dropped too quickly (with restart of minoxidil) resulting in hypoperfusion of brain which may have caused stroke -MRI brain -I have consulted neurology -spoke with cardiology--stated it was okay to perform MRI with loop recorder -will not repeat stroke workup that pt just had done in first week of Feb 2015 -loop recorder has not captured any ectopy at her appt 2 weeks ago -The patient had a TEE on 07/21/2013 which was negative for any emboli or PFO -Check TSH  -d/c ASA--->start plavix Relative hypotension  -The patient's systolic blood pressure has been 190-200 per family -hold labetolol and minoxidil -labetolol prn for sbp >190 -blood cultures x 2 -EKG ESRD -already contacted renal for maintenance HD Leg edema and pain -venous duplex to rule out dvt  History of Acute nonhemorrhagic Stroke (feb 2015) -MRI showed acute/subacute bilateral CVA.  -TEE negative for source of emboli or PFO  -loop recorder to be placed 07/22/23   -Check hemoglobin A1c--7.1, LDL 68 -carotid ultrasound negative  -per neurology recommendations, pt will be started on ASA 325mg  daily upon d/c  -07/21/13 MRA brain negative for any hemodynamic significant stenosis Acute encephalopathy -Likely related to the patient's relative hypotension -Check serum B12, TSH, ammonia, RBC folate -EKG and troponin in pt with sob and diaphoresis this am -UA neg for pyuria Diabetes mellitus type 2  -Patient is on NPH insulin, give half of the dose while  she is in the hospital, we might need to adjust that in the morning.  -Insulin sliding scale -Patient has history of gastroparesis  -Daughter states that patient was not able to tolerate Reglan due agitation and mental status changes    Gastroparesis  -Unable to tolerate metoclopramide as discussed above  -continue erythromycin History of sundowning  -Family patient has history of severe confusion at night, will not be surprised if she has same symptoms tonight.  History of grade 2 diastolic dysfunction  -With lower extremity edema, blood pressure is controlled, patient for dialysis for volume control.        Past Medical History  Diagnosis Date  . Hypertension   . Cataract   . Diabetes mellitus   . Chronic kidney disease   . Shortness of breath   . Anemia   . CVA (cerebral infarction)    Past Surgical History  Procedure Laterality Date  . No past surgeries    . Cataract extraction w/phaco  11/20/2011    Procedure: CATARACT EXTRACTION PHACO AND INTRAOCULAR LENS PLACEMENT (IOC);  Surgeon: Tonny Branch, MD;  Location: [facility];  Service: Ophthalmology;  Laterality: Right;  CDE 18.82  . Cataract extraction w/phaco Left 11/18/2012    Procedure: CATARACT EXTRACTION PHACO AND INTRAOCULAR LENS PLACEMENT (IOC);  Surgeon: Tonny Branch, MD;  Location: [facility];  Service: Ophthalmology;  Laterality: Left;  CDE: 18.97  . Colonoscopy N/A 02/09/2013    Procedure: COLONOSCOPY;  Surgeon: Rogene Houston, MD;  Location: [facility];  Service: Endoscopy;  Laterality: N/A;  305-moved to 220 Ann to notify pt  . Insertion of dialysis catheter Right 06/24/2013    Procedure: INSERTION OF DIALYSIS CATHETER: Ultrasound guided;  Surgeon: Serafina Mitchell, MD;  Location: [facility];  Service: Vascular;  Laterality: Right;  . 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 Tee without cardioversion N/A 07/21/2013    Procedure: TRANSESOPHAGEAL ECHOCARDIOGRAM (TEE);  Surgeon: Dorothy Spark, MD;  Location: [facility];  Service: Cardiovascular;  Laterality: N/A;   Social History:  reports that she has never smoked. She has never used smokeless tobacco. She reports that she does not drink alcohol or use illicit  drugs.   Family History  Problem Relation Age of Onset  . Anesthesia problems Neg Hx   . Hypotension Neg Hx   . Malignant hyperthermia Neg Hx   . Pseudochol deficiency Neg Hx   . Cancer Sister   . Cancer Brother      Allergies  Allergen Reactions  . Ambien [Zolpidem] Other (See Comments)    Hallucinations   . Reglan [Metoclopramide] Other (See Comments)    Hallucinations       Prior to Admission medications   Medication Sig Start Date End Date Taking? Authorizing Provider  acetaminophen (TYLENOL) 325 MG tablet Take 650 mg by mouth every 6 (six) hours as needed (pain).   Yes Historical Provider, MD  aspirin 325 MG tablet Take 325 mg by mouth daily.   Yes Historical Provider, MD  calcium carbonate (TUMS - DOSED IN MG ELEMENTAL CALCIUM) 500 MG chewable tablet Chew 1 tablet by mouth 3 (three) times daily with meals.   Yes Historical Provider, MD  Cyanocobalamin (VITAMIN B-12 IJ) Inject 1 application as directed every 30 (thirty) days.   Yes Historical Provider, MD  insulin NPH Human (HUMULIN N,NOVOLIN N) 100 UNIT/ML injection Inject 10 Units into the skin 2 (two) times daily at 8 am  and 10 pm. 07/04/13  Yes Cherene Altes, MD  labetalol (NORMODYNE) 200 MG tablet Take 200 mg by mouth 2 (two) times daily.   Yes Historical Provider, MD  multivitamin (RENA-VIT) TABS tablet Take 1 tablet by mouth at bedtime. 07/04/13  Yes Cherene Altes, MD  pantoprazole (PROTONIX) 40 MG tablet Take 1 tablet (40 mg total) by mouth daily with supper. 07/04/13  Yes Cherene Altes, MD  sodium chloride 0.9 % SOLN 100 mL with ferric gluconate 12.5 MG/ML SOLN 125 mg Inject 125 mg into the vein every Monday, Wednesday, and Friday with hemodialysis. 07/04/13  Yes Cherene Altes, MD    Review of Systems:  Constitutional:  No weight loss, night sweats, Fevers, chills,  Head&Eyes: No headache.  No vision loss.  No eye pain or scotoma ENT:  No Difficulty swallowing,Tooth/dental problems,Sore throat,  ,   Cardio-vascular:  No chest pain, Orthopnea, PND,  dizziness, palpitations  GI:  No  abdominal pain,  diarrhea, loss of appetite, hematochezia, melena, heartburn, indigestion, Resp:  +DOE No coughing up of blood .No wheezing.No chest wall deformity  Skin:  no rash or lesions.  GU:  no dysuria, change in color of urine, no urgency or frequency. No flank pain.  Musculoskeletal:  No joint pain or swelling. No decreased range of motion. No back pain.  Psych:  No change in mood or affect. No depression or anxiety. Neurologic: No  no dysesthesia, no focal weakness,  No syncope  Physical Exam: Filed Vitals:   08/19/13 1327 08/19/13 1333 08/19/13 1450 08/19/13 1515  BP: 118/55  125/59 114/56  Pulse: 71  94   Temp:  98.3 F (36.8 C)    TempSrc:      Resp: 18  19 17   SpO2:   98%    General:  A&O x 2, NAD, nontoxic, pleasant/cooperative Head/Eye: No conjunctival hemorrhage, no icterus, [place]/AT, No nystagmus ENT:  No icterus,  No thrush, edentulous, no pharyngeal exudate Neck:  No masses, no lymphadenpathy, no bruits CV:  RRR, no rub, no gallop, no S3 Lung:  Bibasilar crackles. Good air movement. No wheezing.  Abdomen: soft/NT, +BS, nondistended, no peritoneal signs Ext: No cyanosis, No rashes, No petechiae, No lymphangitis, No edema Neuro: CNII-XII intact, strength 4-/5 in RUE, RLE  With 4/5 in LUE, LLE, no dysmetria, +dyspraxia  Labs on Admission:  Basic Metabolic Panel:  Recent Labs Lab 08/19/13 1210  NA 142  K 4.3  CL 103  CO2 25  GLUCOSE 165*  BUN 28*  CREATININE 5.03*  CALCIUM 8.9   Liver Function Tests: No results found for this basename: AST, ALT, ALKPHOS, BILITOT, PROT, ALBUMIN,  in the last 168 hours No results found for this basename: LIPASE, AMYLASE,  in the last 168 hours No results found for this basename: AMMONIA,  in the last 168 hours CBC:  Recent Labs Lab 08/19/13 1210  WBC 10.1  HGB 9.6*  HCT 28.8*  MCV 86.7  PLT 208   Cardiac Enzymes: No  results found for this basename: CKTOTAL, CKMB, CKMBINDEX, TROPONINI,  in the last 168 hours BNP: No components found with this basename: POCBNP,  CBG:  Recent Labs Lab 08/19/13 1203  GLUCAP 156*    Radiological Exams on Admission: Dg Chest 2 View  08/19/2013   CLINICAL DATA:  Weakness, confusion.  EXAM: CHEST  2 VIEW  COMPARISON:  07/20/2013  FINDINGS: Mild cardiomegaly. Vascular congestion. Bibasilar airspace opacities likely reflect atelectasis. Suspect small layering effusions. Right dialysis catheter is  in place with the tip in the right atrium, unchanged. No acute bony abnormality.  IMPRESSION: Cardiomegaly with vascular congestion.  Small bilateral effusions with bibasilar densities, likely atelectasis.   Electronically Signed   By: Rolm Baptise M.D.   On: 08/19/2013 13:01   Ct Head (brain) Wo Contrast  08/19/2013   CLINICAL DATA:  73 year old with weakness. Patient was unable to walk.  EXAM: CT HEAD WITHOUT CONTRAST  TECHNIQUE: Contiguous axial images were obtained from the base of the skull through the vertex without contrast.  COMPARISON:  06/25/2013  FINDINGS: Chronic calcifications in the basal ganglia. No evidence for acute hemorrhage, mass lesion, midline shift, hydrocephalus or large infarct. Chronic low-density in the periventricular and subcortical white matter, particularly in the parietal lobes. Visualized sinuses are clear. No acute bone abnormality.  IMPRESSION: No acute intracranial abnormality.  White matter changes are suggestive for chronic small vessel ischemic disease.   Electronically Signed   By: Markus Daft M.D.   On: 08/19/2013 14:00    EKG: Independently reviewed. pending    Time spent:70 minutes Code Status:   FULL Family Communication:   daughters at bedside   Hartleigh Edmonston, DO  [facility] Pager [phone]  If 7PM-7AM, please contact night-coverage [email or web address] Password [email or web address] 08/19/2013, 3:52 PM

## 2013-08-19 NOTE — ED Notes (Addendum)
Daughters states when they got pt up out of bed this she was unable to walk and "both her legs were trembling." they said her R arm seems weaker also. The pt is a&ox4, states she does not feel well and she has a headache. She is a dialysis pt and today is her dialysis day, she did not go today they brought her here instead. They said she is normally ambulatory

## 2013-08-20 ENCOUNTER — Inpatient Hospital Stay (HOSPITAL_COMMUNITY): Payer: Medicare Other

## 2013-08-20 DIAGNOSIS — I10 Essential (primary) hypertension: Secondary | ICD-10-CM

## 2013-08-20 LAB — BASIC METABOLIC PANEL
BUN: 32 mg/dL — AB (ref 6–23)
CALCIUM: 8.6 mg/dL (ref 8.4–10.5)
CHLORIDE: 104 meq/L (ref 96–112)
CO2: 24 mEq/L (ref 19–32)
Creatinine, Ser: 5.65 mg/dL — ABNORMAL HIGH (ref 0.50–1.10)
GFR calc non Af Amer: 7 mL/min — ABNORMAL LOW (ref >90)
GFR, EST AFRICAN AMERICAN: 8 mL/min — AB (ref >90)
Glucose, Bld: 136 mg/dL — ABNORMAL HIGH (ref 70–99)
Potassium: 3.8 mEq/L (ref 3.7–5.3)
Sodium: 143 mEq/L (ref 137–147)

## 2013-08-20 LAB — TROPONIN I
Troponin I: <0.3 ng/mL (ref <0.30)
Troponin I: <0.3 ng/mL (ref <0.30)

## 2013-08-20 LAB — CBC
HCT: 25.8 % — ABNORMAL LOW (ref 36.0–46.0)
Hemoglobin: 8.5 g/dL — ABNORMAL LOW (ref 12.0–15.0)
MCH: 28.3 pg (ref 26.0–34.0)
MCHC: 32.9 g/dL (ref 30.0–36.0)
MCV: 86 fL (ref 78.0–100.0)
PLATELETS: 206 10*3/uL (ref 150–400)
RBC: 3 MIL/uL — ABNORMAL LOW (ref 3.87–5.11)
RDW: 17.3 % — AB (ref 11.5–15.5)
WBC: 8.2 10*3/uL (ref 4.0–10.5)

## 2013-08-20 LAB — GLUCOSE, CAPILLARY
GLUCOSE-CAPILLARY: 128 mg/dL — AB (ref 70–99)
GLUCOSE-CAPILLARY: 106 mg/dL — AB (ref 70–99)
Glucose-Capillary: 147 mg/dL — ABNORMAL HIGH (ref 70–99)
Glucose-Capillary: 139 mg/dL — ABNORMAL HIGH (ref 70–99)

## 2013-08-20 LAB — TSH: TSH: 1.342 u[IU]/mL (ref 0.350–4.500)

## 2013-08-20 LAB — VITAMIN B12: Vitamin B-12: 997 pg/mL — ABNORMAL HIGH (ref 211–911)

## 2013-08-20 MED ORDER — LIDOCAINE-PRILOCAINE 2.5-2.5 % EX CREA: 1.0000 "application " | TOPICAL_CREAM | CUTANEOUS | Status: DC | PRN

## 2013-08-20 MED ORDER — NEPRO/CARBSTEADY PO LIQD: 237.0000 mL | ORAL | Status: DC | PRN

## 2013-08-20 MED ORDER — SODIUM CHLORIDE 0.9 % IV SOLN: 100.0000 mL | INTRAVENOUS | Status: DC | PRN

## 2013-08-20 MED ORDER — LIDOCAINE HCL (PF) 1 % IJ SOLN: 5.0000 mL | INTRAMUSCULAR | Status: DC | PRN

## 2013-08-20 MED ORDER — PENTAFLUOROPROP-TETRAFLUOROETH EX AERO: 1.0000 "application " | INHALATION_SPRAY | CUTANEOUS | Status: DC | PRN

## 2013-08-20 MED ORDER — LABETALOL HCL 5 MG/ML IV SOLN
5.0000 mg | Freq: Once | INTRAVENOUS | Status: AC
  Administered 2013-08-20: 5 mg via INTRAVENOUS
  Filled 2013-08-20: qty 4

## 2013-08-20 MED ORDER — HEPARIN SODIUM (PORCINE) 1000 UNIT/ML DIALYSIS
1000.0000 [IU] | INTRAMUSCULAR | Status: DC | PRN
  Filled 2013-08-20: qty 1

## 2013-08-20 MED ORDER — RESOURCE THICKENUP CLEAR PO POWD
ORAL | Status: DC | PRN
  Filled 2013-08-20 (×2): qty 125

## 2013-08-20 MED ORDER — ALTEPLASE 2 MG IJ SOLR: 2.0000 mg | Freq: Once | INTRAMUSCULAR | Status: AC | PRN

## 2013-08-20 NOTE — Procedures (Signed)
I was present at this dialysis session. I have reviewed the session itself and made appropriate changes.   Goal UF 2.1L.  TDC.  Pt feels well.  No c/o  [name]  MD 08/20/2013, 7:11 PM

## 2013-08-20 NOTE — Progress Notes (Signed)
Stroke Team Progress Note  HISTORY [name] is a 73 y.o. female who was recently seen at [facility] hospital for bilateral frontal CVA. During that visit source of CVA was not found and patient was changed from 81 mg ASA to 325 mg. Per family and notes the patient's systolic blood pressure has been elevated up to 200 range. She recently saw her primary care physician on 08/18/2013. The patient was restarted on minoxidil in addition to her labetalol. This AM she was found by her daughter to be diaphoretic, AMS, limp right arm. Patient was brought to the ED where her BP was 111/49 (significantly lower than her normal 200's). Currently she remains confused, unable to follow verbal or visual commands consistently. Neurology was consulted for possible stroke.   Date last known well: Date: 08/19/2013  Time last known well: Unable to determine  tPA Given: No: recent stroke and unknown LSN   SUBJECTIVE No family members present this morning. The patient was no complaints although she is anxious for discharge.  OBJECTIVE Most recent Vital Signs: Filed Vitals:   08/19/13 2247 08/20/13 0036 08/20/13 0230 08/20/13 0434  BP: 119/63 118/63 121/62 134/69  Pulse: 101 101 100 103  Temp: 97.4 F (36.3 C) 98.1 F (36.7 C) 98 F (36.7 C) 97.3 F (36.3 C)  TempSrc: Oral Oral Oral Oral  Resp: 18 16 16 16   Height:      Weight:      SpO2: 97% 100% 100% 97%   CBG (last 3)   Recent Labs  08/19/13 1203 08/19/13 2156 08/20/13 0638  GLUCAP 156* 151* 128*    IV Fluid Intake:     MEDICATIONS  . calcium carbonate  1 tablet Oral TID WC  . clopidogrel  75 mg Oral Q breakfast  . erythromycin  250 mg Oral TID WC & HS  . heparin  5,000 Units Subcutaneous 3 times per day  . insulin aspart  0-9 Units Subcutaneous TID WC  . insulin NPH Human  5 Units Subcutaneous BID AC & HS  . multivitamin  1 tablet Oral QHS  . pantoprazole  40 mg Oral Q supper  . sodium chloride  3 mL Intravenous Q12H   PRN:   acetaminophen, acetaminophen, ondansetron (ZOFRAN) IV, ondansetron  Diet:  Cardiac thin liquids Activity:   DVT Prophylaxis:  Subcutaneous heparin  CLINICALLY SIGNIFICANT STUDIES Basic Metabolic Panel:  Recent Labs Lab 08/19/13 1210 08/20/13 0057  NA 142 143  K 4.3 3.8  CL 103 104  CO2 25 24  GLUCOSE 165* 136*  BUN 28* 32*  CREATININE 5.03* 5.65*  CALCIUM 8.9 8.6   Liver Function Tests: No results found for this basename: AST, ALT, ALKPHOS, BILITOT, PROT, ALBUMIN,  in the last 168 hours CBC:  Recent Labs Lab 08/19/13 1210 08/20/13 0057  WBC 10.1 8.2  HGB 9.6* 8.5*  HCT 28.8* 25.8*  MCV 86.7 86.0  PLT 208 206   Coagulation: No results found for this basename: LABPROT, INR,  in the last 168 hours Cardiac Enzymes:  Recent Labs Lab 08/19/13 1900 08/20/13 0057  TROPONINI <0.30 <0.30   Urinalysis:  Recent Labs Lab 08/19/13 1313  COLORURINE YELLOW  LABSPEC 1.022  PHURINE 6.0  GLUCOSEU 250*  HGBUR NEGATIVE  BILIRUBINUR SMALL*  KETONESUR NEGATIVE  PROTEINUR >300*  UROBILINOGEN 0.2  NITRITE NEGATIVE  LEUKOCYTESUR NEGATIVE   Lipid Panel    Component Value Date/Time   CHOL 116 07/21/2013 0720   TRIG 97 07/21/2013 0720   HDL 29* 07/21/2013  0720   CHOLHDL 4.0 07/21/2013 0720   VLDL 19 07/21/2013 0720   LDLCALC 68 07/21/2013 0720   HgbA1C  Lab Results  Component Value Date   HGBA1C 7.1* 07/21/2013    Urine Drug Screen:   No results found for this basename: labopia, cocainscrnur, labbenz, amphetmu, thcu, labbarb    Alcohol Level: No results found for this basename: ETH,  in the last 168 hours  Dg Chest 2 View 08/19/2013    Cardiomegaly with vascular congestion.  Small bilateral effusions with bibasilar densities, likely atelectasis.       Ct Head (brain) Wo Contrast 08/19/2013    No acute intracranial abnormality.  White matter changes are suggestive for chronic small vessel ischemic disease.      MRI of the brain  - pending  MRA of the brain    2D  Echocardiogram  performed 06/26/2013  TEE - performed 07/21/2013  Carotid Doppler  performed 07/20/2013  CXR   08/19/2013 Cardiomegaly with vascular congestion.  Small bilateral effusions with bibasilar densities, likely atelectasis.  EKG sinus rhythm rate 89 beats per minute. For complete results please see formal report.   Therapy Recommendations pending  Physical Exam   Mental Status:  Alert, confused, tend to repeat what i am asking her. Speech fluent without evidence of aphasia. Not able to follow 3 step commands without difficulty.  Cranial Nerves:  II: Discs flat bilaterally; possible right hemianopia but difficult to assess as patient does not blink to threat bilaterally and will randomly call out numbers of fingers. She does seem to see my fingers come into field on the left side. , pupils equal, round, reactive to light and accommodation  III,IV, VI: ptosis not present, extra-ocular motions intact bilaterally  V,VII: smile symmetric, facial light touch sensation normal bilaterally  VIII: hearing normal bilaterally  IX,X: gag reflex present  XI: bilateral shoulder shrug  XII: midline tongue extension without atrophy or fasciculations  Motor:  Right : Upper extremity 5/5(note) Left: Upper extremity 5/5  Lower extremity 4/5 Lower extremity 5/5  --weak on the tricep extension  Tone and bulk:normal tone throughout; no atrophy noted  Sensory: Pinprick and light touch intact throughout, bilaterally  Deep Tendon Reflexes:  Right: Upper Extremity Left: Upper extremity  biceps (C-5 to C-6) 2/4 biceps (C-5 to C-6) 2/4  tricep (C7) 2/4 triceps (C7) 2/4  Brachioradialis (C6) 2/4 Brachioradialis (C6) 2/4  Lower Extremity Lower Extremity  quadriceps (L-2 to L-4) 2/4 quadriceps (L-2 to L-4) 2/4  Achilles (S1) 2/4 Achilles (S1) 2/4  Plantars:  Right: downgoing Left: downgoing  Cerebellar:  Unable to assess  Gait: unable to assess    ASSESSMENT [name] is a 73  y.o. female presenting with confusion. TPA was not given at the time of onset could not be determined and the patient had recently had a stroke in February. A CT scan of the head showed no acute changes. An MRI is pending. On aspirin 325 mg orally every day prior to admission. Now on clopidogrel 75 mg orally every day for secondary stroke prevention. Patient with resultant altered mental status. Stroke work up underway.   End-stage renal disease - dialysis  Hypertension / recent hypotension  Diabetes mellitus - hemoglobin A1c 7.1  Anemia  Cholesterol 116 LDL 68  Previous small bifrontal strokes by MRI 07/19/2013   Hospital day # 1  TREATMENT/PLAN  Continue clopidogrel 75 mg orally every day for secondary stroke prevention.  Await MRI  Await therapy evaluations  Renal has been contacted regarding dialysis  Monitor blood pressure - avoid hypotension - currently off all antihypertensive medications.   Mikey Bussing PA-C [facility] Pager [phone] 08/20/2013, 8:21 AM  I have personally obtained a history, examined the patient, evaluated imaging results, and formulated the assessment and plan of care. I agree with the above.  HD today. Pt presented with hypotension. Strokes in mutiple areas but appear to be in watershed areas likely due to periods hypotension.  Further work up as above. MAP goal >65. Please call with questions.  Leotis Pain

## 2013-08-20 NOTE — Progress Notes (Signed)
PT Note Evaluation completed.  Full note to follow.  MD:  Pt will need Speech language evaluation.  Please order if you agree. Also spoke with ST about pt coughing after meals per daughter and ST going in to see pt right now.  Continue PT.  Thanks. [facility] [phone] (pager)

## 2013-08-20 NOTE — Consult Note (Signed)
I saw the patient and agree with the above assessment and plan.    Pt with more global cognitive issues on my exam with confusion most impressive.  Neurology following.  Doubt this is directly related to HD/renal failure.    [name], MD

## 2013-08-20 NOTE — Evaluation (Signed)
Occupational Therapy Evaluation Patient Details Name: [name] MRN: [id number] DOB: 09/22/1940 Today's Date: 08/20/2013 Time: [id number] OT Time Calculation (min): 22 min  OT Assessment / Plan / Recommendation History of present illness  73 y.o. female admitted with questionable CVA secondary to HTN. Ct negative pending MRI. pt currently confused adn weak. pt dialysis patient on MWF   Clinical Impression   PT admitted with Rt side weakness, AMS and HTN. Pt currently with functional limitiations due to the deficits listed below (see OT problem list). Pt currently with cognitive deficits and family providing information documented by OT Pt will benefit from skilled OT to increase their independence and safety with adls and balance to allow discharge CIR. Family agreeable to CIR information. If community based rehab required patients family would like information on [place] area search. Family can not provided total +2 (A) required at this time. Pt has strong family support PTA.      OT Assessment  Patient needs continued OT Services    Follow Up Recommendations  CIR    Barriers to Discharge      Equipment Recommendations  3 in 1 bedside comode;Wheelchair (measurements OT);Wheelchair cushion (measurements OT);Hospital bed    Recommendations for Other Services Rehab consult  Frequency  Min 3X/week    Precautions / Restrictions Precautions Precautions: Fall Precaution Comments: diaylsis port Rt chest, IV Rt forearm, restricted Lt UE   Pertinent Vitals/Pain None reported HD port in Rt chest and IV in right forearm. Pink arm band applied to restrict BP. LT UE already wearing pink arm band. RN Sherlynn Stalls notified that BP will required in bil LE.    ADL  Grooming: Wash/dry hands;Wash/dry face;Teeth care;Simulated;+1 Total assistance Upper Body Bathing: +1 Total assistance Lower Body Bathing: +1 Total assistance Upper Body Dressing: +1 Total assistance Lower Body Dressing: +1  Total assistance Toilet Transfer: +1 Total assistance ADL Comments: Pt with cognitive deficits affecting all aspects of ADLs. pt unable to sustain attention to task. pt with apraxic motor movements and difficulty with initiation. Pt rocking at eob in a setting rocking pattern. pt unable to sustain bil LE on floor in static sitting. pt singing prior to entry to just sing.    OT Diagnosis: Generalized weakness;Cognitive deficits;Apraxia  OT Problem List: Decreased strength;Decreased range of motion;Decreased activity tolerance;Impaired vision/perception;Impaired balance (sitting and/or standing);Decreased coordination;Decreased cognition;Decreased safety awareness;Decreased knowledge of use of DME or AE;Decreased knowledge of precautions;Impaired UE functional use OT Treatment Interventions: Self-care/ADL training;Therapeutic exercise;Neuromuscular education;DME and/or AE instruction;Therapeutic activities;Cognitive remediation/compensation;Visual/perceptual remediation/compensation;Patient/family education;Balance training   OT Goals(Current goals can be found in the care plan section) Acute Rehab OT Goals Patient Stated Goal: none stated by patient OT Goal Formulation: With patient/family Time For Goal Achievement: 09/03/13 Potential to Achieve Goals: Good  Visit Information  Last OT Received On: 08/20/13 Assistance Needed: +2 History of Present Illness:  73 y.o. female admitted with questionable CVA secondary to HTN. Ct negative pending MRI. pt currently confused adn weak. pt dialysis patient on MWF       Prior [place] expects to be discharged to:: Private residence Living Arrangements: Children Available Help at Discharge: Family;Available 24 hours/day Type of Home: House Home Access: Stairs to enter [employer] of Steps: 1 Entrance Stairs-Rails: None Home Layout: One level Home Equipment: Walker - 4 wheels;Bedside commode;Tub  bench Prior Function Level of Independence: Needs assistance ADL's / Homemaking Assistance Needed: until recently pt was doing all ADLS alone. family has assisted recently. Communication Communication:  Expressive difficulties Dominant Hand: Right         Vision/Perception Vision - Assessment Vision Assessment: Vision impaired - to be further tested in functional context Additional Comments: daughter reports pending eye appointment due to retina issue. Dgter  unable to verbalize which eye or what deficits are present. Pt with static gazed stare currently. pt with no blink with threat to Rt eye. Perception Perception: Impaired Inattention/Neglect: Does not attend to right side of body Praxis Praxis: Impaired Praxis Impairment Details: Motor planning;Initiation   Cognition  Cognition Arousal/Alertness: Awake/alert Behavior During Therapy: Flat affect Overall Cognitive Status: Impaired/Different from baseline Area of Impairment: Orientation;Attention;Memory;Following commands;Safety/judgement;Awareness;Problem solving Orientation Level: Disoriented to;Person;Place;Time;Situation Current Attention Level: Focused Memory: Decreased recall of precautions;Decreased short-term memory Following Commands: Follows one step commands inconsistently Safety/Judgement: Decreased awareness of safety;Decreased awareness of deficits Awareness: Emergent;Anticipatory;Intellectual Problem Solving: Slow processing;Decreased initiation;Difficulty sequencing;Requires verbal cues;Requires tactile cues General Comments: Pt unable to verbalize who visitors in the room are and calls them by incorrect names. x2 daughters present. pt unable to give location ( names [place] and shocked when educated [place]) Pt unable to give full DOB. Pt reports with very delayed response [birth year]--. Pt unable to provided year for DOB with cueing provided. Pt able to recall information stated after 15 seconds, 30 seconds  but delayed initiation to respond to command. Pt repeating back to therapist commands with no action to command.     Extremity/Trunk Assessment Upper Extremity Assessment Upper Extremity Assessment: LUE deficits/detail;RUE deficits/detail;Difficult to assess due to impaired cognition RUE Deficits / Details: tone noted and with elbow extension end range tremor noted. Pt holding RT UE in flexion synergy pattern Brunstrom IV  RUE Coordination: decreased fine motor;decreased gross motor LUE Deficits / Details: generalized weakness noted 3- out 5 MMT. pt with weak grasp. Pt with delayed response to all tactile inpute LUE Coordination: decreased fine motor;decreased gross motor Lower Extremity Assessment Lower Extremity Assessment: Defer to PT evaluation;Difficult to assess due to impaired cognition RLE Deficits / Details: able to hold in knee flexion supine LLE Deficits / Details: able to hold in knee flexion supine Cervical / Trunk Assessment Cervical / Trunk Assessment: Kyphotic     Mobility Bed Mobility Overal bed mobility: Needs Assistance Bed Mobility: Supine to Sit;Sit to Supine Supine to sit: Mod assist;HOB elevated Sit to supine: Total assist General bed mobility comments: bed tilted in reverse position and pt with knee flexed pushing on count of 3 toward HOB. OT pulling with pad     Exercise     Balance Balance Sitting-balance support: Feet unsupported;No upper extremity supported Sitting balance-Leahy Scale: Poor Postural control: Posterior lean   End of Session OT - End of Session Activity Tolerance: Patient tolerated treatment well Patient left: in bed;with call bell/phone within reach;with family/visitor present Nurse Communication: Mobility status;Precautions  GO     Peri Maris 08/20/2013, 2:27 PM Pager: [phone]

## 2013-08-20 NOTE — Progress Notes (Signed)
Pt's BP 195/71 and HR 120 after returning from dialysis. Paged Dr. Rogue Bussing who returned my call. He gave an order for a BP med. Will continue to monitor. Delron Comer, Rande Brunt

## 2013-08-20 NOTE — Consult Note (Signed)
[facility] Renal Consultation Note  Indication for Consultation:  Management of ESRD/hemodialysis; anemia, hypertension/volume and secondary hyperparathyroidism  HPI: [name] is a 73 y.o. 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  Her systolic blood pressures have reportedly been in the 200 range throughout recent dialysis treatments, but she had not been taking her medications prior to dialysis, but was recently instructed to do so.  She was seen by her primary physician on 3/5 and was instructed to restart minoxidil.  Her daughter also reported that the patient has recently had dyspnea on exertion and a nonproductive cough, but denies any orthopnea, fever, or chills. CT of the head shows no acute abnormality, and her blood pressure is now 115/67, but her chest x-ray indicates vascular congestion with small bilateral effusions with bibasilar densities.  Neurology has seen the patient with concern for possible acute CVA secondary to hypotension, and workup is pending.  She remains weak and confused, and most of the history was obtained from her daughters.  Dialysis Orders:  MWF @ [facility] in [place] 4 hrs      75 kg      3K/2.5Ca      350/600       Heparin 0       R IJ catheter Hectorol 1 mcg     Epogen 7700 U       Venofer 50 mg on Wed  Past Medical History  Diagnosis Date  . Hypertension   . Cataract   . Diabetes mellitus   . Chronic kidney disease   . Shortness of breath   . Anemia   . CVA (cerebral infarction)    Past Surgical History  Procedure Laterality Date  . No past surgeries    . Cataract extraction w/phaco  11/20/2011    Procedure: CATARACT EXTRACTION PHACO AND INTRAOCULAR LENS PLACEMENT (IOC);  Surgeon: Tonny Branch, MD;  Location: [facility]  ORS;  Service: Ophthalmology;  Laterality: Right;  CDE 18.82  . Cataract extraction w/phaco Left 11/18/2012    Procedure: CATARACT EXTRACTION PHACO AND INTRAOCULAR LENS PLACEMENT (IOC);  Surgeon: Tonny Branch, MD;  Location: [facility] ORS;  Service: Ophthalmology;  Laterality: Left;  CDE: 18.97  . Colonoscopy N/A 02/09/2013    Procedure: COLONOSCOPY;  Surgeon: Rogene Houston, MD;  Location: [facility] ENDO SUITE;  Service: Endoscopy;  Laterality: N/A;  305-moved to 220 Ann to notify pt  . Insertion of dialysis catheter Right 06/24/2013    Procedure: INSERTION OF DIALYSIS CATHETER: Ultrasound guided;  Surgeon: Serafina Mitchell, MD;  Location: [facility];  Service: Vascular;  Laterality: Right;  . 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 Tee without cardioversion N/A 07/21/2013    Procedure: TRANSESOPHAGEAL ECHOCARDIOGRAM (TEE);  Surgeon: Dorothy Spark, MD;  Location[facility] ENDOSCOPY;  Service: Cardiovascular;  Laterality: N/A;   Family History  Problem Relation Age of Onset  . Anesthesia problems Neg Hx   . Hypotension Neg Hx   . Malignant hyperthermia Neg Hx   . Pseudochol deficiency Neg Hx   . Cancer Sister   . Cancer Brother    Social History She denies any history of tobacco, alcohol, or illicit drug use.  Allergies  Allergen Reactions  . Ambien [  Zolpidem] Other (See Comments)    Hallucinations   . Reglan [Metoclopramide] Other (See Comments)    Hallucinations    Prior to Admission medications   Medication Sig Start Date End Date Taking? Authorizing Provider  acetaminophen (TYLENOL) 325 MG tablet Take 650 mg by mouth every 6 (six) hours as needed (pain).   Yes Historical Provider, MD  aspirin 325 MG tablet Take 325 mg by mouth daily.   Yes Historical Provider, MD  calcium carbonate (TUMS - DOSED IN MG ELEMENTAL CALCIUM) 500 MG chewable tablet Chew 1 tablet by mouth 3 (three) times daily with meals.   Yes Historical Provider, MD  Cyanocobalamin (VITAMIN  B-12 IJ) Inject 1 application as directed every 30 (thirty) days.   Yes Historical Provider, MD  insulin NPH Human (HUMULIN N,NOVOLIN N) 100 UNIT/ML injection Inject 10 Units into the skin 2 (two) times daily at 8 am and 10 pm. 07/04/13  Yes Cherene Altes, MD  labetalol (NORMODYNE) 200 MG tablet Take 200 mg by mouth 2 (two) times daily.   Yes Historical Provider, MD  multivitamin (RENA-VIT) TABS tablet Take 1 tablet by mouth at bedtime. 07/04/13  Yes Cherene Altes, MD  pantoprazole (PROTONIX) 40 MG tablet Take 1 tablet (40 mg total) by mouth daily with supper. 07/04/13  Yes Cherene Altes, MD  sodium chloride 0.9 % SOLN 100 mL with ferric gluconate 12.5 MG/ML SOLN 125 mg Inject 125 mg into the vein every Monday, Wednesday, and Friday with hemodialysis. 07/04/13  Yes Cherene Altes, MD   Labs:  Results for orders placed during the hospital encounter of 08/19/13 (from the past 48 hour(s))  CBG MONITORING, ED     Status: Abnormal   Collection Time    08/19/13 12:03 PM      Result Value Ref Range   Glucose-Capillary 156 (*) 70 - 99 mg/dL  CBC     Status: Abnormal   Collection Time    08/19/13 12:10 PM      Result Value Ref Range   WBC 10.1  4.0 - 10.5 K/uL   RBC 3.32 (*) 3.87 - 5.11 MIL/uL   Hemoglobin 9.6 (*) 12.0 - 15.0 g/dL   HCT 28.8 (*) 36.0 - 46.0 %   MCV 86.7  78.0 - 100.0 fL   MCH 28.9  26.0 - 34.0 pg   MCHC 33.3  30.0 - 36.0 g/dL   RDW 17.7 (*) 11.5 - 15.5 %   Platelets 208  150 - 400 K/uL  BASIC METABOLIC PANEL     Status: Abnormal   Collection Time    08/19/13 12:10 PM      Result Value Ref Range   Sodium 142  137 - 147 mEq/L   Potassium 4.3  3.7 - 5.3 mEq/L   Chloride 103  96 - 112 mEq/L   CO2 25  19 - 32 mEq/L   Glucose, Bld 165 (*) 70 - 99 mg/dL   BUN 28 (*) 6 - 23 mg/dL   Creatinine, Ser 5.03 (*) 0.50 - 1.10 mg/dL   Calcium 8.9  8.4 - 10.5 mg/dL   GFR calc non Af Amer 8 (*) >90 mL/min   GFR calc Af Amer 9 (*) >90 mL/min   Comment: (NOTE)     The eGFR has  been calculated using the CKD EPI equation.     This calculation has not been validated in all clinical situations.     eGFR's persistently <90 mL/min signify possible Chronic Kidney  Disease.  URINALYSIS, ROUTINE W REFLEX MICROSCOPIC     Status: Abnormal   Collection Time    08/19/13  1:13 PM      Result Value Ref Range   Color, Urine YELLOW  YELLOW   APPearance CLOUDY (*) CLEAR   Specific Gravity, Urine 1.022  1.005 - 1.030   pH 6.0  5.0 - 8.0   Glucose, UA 250 (*) NEGATIVE mg/dL   Hgb urine dipstick NEGATIVE  NEGATIVE   Bilirubin Urine SMALL (*) NEGATIVE   Ketones, ur NEGATIVE  NEGATIVE mg/dL   Protein, ur >300 (*) NEGATIVE mg/dL   Urobilinogen, UA 0.2  0.0 - 1.0 mg/dL   Nitrite NEGATIVE  NEGATIVE   Leukocytes, UA NEGATIVE  NEGATIVE  URINE MICROSCOPIC-ADD ON     Status: Abnormal   Collection Time    08/19/13  1:13 PM      Result Value Ref Range   Squamous Epithelial / LPF RARE  RARE   Bacteria, UA FEW (*) RARE   Urine-Other AMORPHOUS URATES/PHOSPHATES    TROPONIN I     Status: None   Collection Time    08/19/13  7:00 PM      Result Value Ref Range   Troponin I <0.30  <0.30 ng/mL   Comment:            Due to the release kinetics of cTnI,     a negative result within the first hours     of the onset of symptoms does not rule out     myocardial infarction with certainty.     If myocardial infarction is still suspected,     repeat the test at appropriate intervals.  TSH     Status: None   Collection Time    08/19/13  7:00 PM      Result Value Ref Range   TSH 1.342  0.350 - 4.500 uIU/mL   Comment: Performed at [place]     Status: Abnormal   Collection Time    08/19/13  7:00 PM      Result Value Ref Range   Vitamin B-12 997 (*) 211 - 911 pg/mL   Comment: Performed at [facility]     Status: None   Collection Time    08/19/13  7:00 PM      Result Value Ref Range   Ammonia 28  11 - 60 umol/L  GLUCOSE, CAPILLARY      Status: Abnormal   Collection Time    08/19/13  9:56 PM      Result Value Ref Range   Glucose-Capillary 151 (*) 70 - 99 mg/dL  TROPONIN I     Status: None   Collection Time    08/20/13 12:57 AM      Result Value Ref Range   Troponin I <0.30  <0.30 ng/mL   Comment:            Due to the release kinetics of cTnI,     a negative result within the first hours     of the onset of symptoms does not rule out     myocardial infarction with certainty.     If myocardial infarction is still suspected,     repeat the test at appropriate intervals.  BASIC METABOLIC PANEL     Status: Abnormal   Collection Time    08/20/13 12:57 AM      Result Value Ref Range   Sodium 143  137 - 147 mEq/L   Potassium 3.8  3.7 - 5.3 mEq/L   Chloride 104  96 - 112 mEq/L   CO2 24  19 - 32 mEq/L   Glucose, Bld 136 (*) 70 - 99 mg/dL   BUN 32 (*) 6 - 23 mg/dL   Creatinine, Ser 5.65 (*) 0.50 - 1.10 mg/dL   Calcium 8.6  8.4 - 10.5 mg/dL   GFR calc non Af Amer 7 (*) >90 mL/min   GFR calc Af Amer 8 (*) >90 mL/min   Comment: (NOTE)     The eGFR has been calculated using the CKD EPI equation.     This calculation has not been validated in all clinical situations.     eGFR's persistently <90 mL/min signify possible Chronic Kidney     Disease.  CBC     Status: Abnormal   Collection Time    08/20/13 12:57 AM      Result Value Ref Range   WBC 8.2  4.0 - 10.5 K/uL   RBC 3.00 (*) 3.87 - 5.11 MIL/uL   Hemoglobin 8.5 (*) 12.0 - 15.0 g/dL   HCT 25.8 (*) 36.0 - 46.0 %   MCV 86.0  78.0 - 100.0 fL   MCH 28.3  26.0 - 34.0 pg   MCHC 32.9  30.0 - 36.0 g/dL   RDW 17.3 (*) 11.5 - 15.5 %   Platelets 206  150 - 400 K/uL  GLUCOSE, CAPILLARY     Status: Abnormal   Collection Time    08/20/13  6:38 AM      Result Value Ref Range   Glucose-Capillary 128 (*) 70 - 99 mg/dL   Comment 1 Documented in Chart     Comment 2 Notify RN    TROPONIN I     Status: None   Collection Time    08/20/13  8:50 AM      Result Value Ref Range    Troponin I <0.30  <0.30 ng/mL   Comment:            Due to the release kinetics of cTnI,     a negative result within the first hours     of the onset of symptoms does not rule out     myocardial infarction with certainty.     If myocardial infarction is still suspected,     repeat the test at appropriate intervals.   Review of systems not obtained due to patient factors.  Physical Exam: Filed Vitals:   08/20/13 0926  BP: 115/67  Pulse: 99  Temp: 99.3 F (37.4 C)  Resp: 16     General appearance: flat affect, confused, slow to respond Head: Normocephalic, without obvious abnormality, atraumatic Neck: no adenopathy, no carotid bruit, no JVD and supple, symmetrical, trachea midline Resp: clear to auscultation bilaterally Cardio: RRR with Gr II/VI systolic murmur, no rub GI: soft, non-tender; bowel sounds normal; no masses,  no organomegaly Extremities: extremities normal, atraumatic, no cyanosis or edema Dialysis Access:  R IJ catheter   Assessment/Plan: 1. AMS/Weakness - s/p bilateral frontal CVA 07/2013, TEE negative for source; CT 3/6 negative, current workup per Neurology, MRI pending, but possibly acute CVA sec to rapid BP drop and hypoperfusion of brain. 2. ESRD - HD on MWF @ [facility] in [place]; K 3.8. HD pending today. 3. Hypertension/volume - BP 115/67; recently with SBPs ~200 during HD, but restarted minoxidil on 3/5 per PCP; current wt below EDW, but frequent DOE, cough. 4. Anemia - Hgb  down to 8.5, on outpatient Epogen and weekly Venofer.  Aranesp with HD. 5. Metabolic bone disease - Ca 8.6; Hectorol 1 mcg, Tums with meals. 6. Nutrition - Last Alb 2.7, renal diet & vitamin. 7. DM Type 2 - insulin per primary.  Kalana Yust 08/20/2013, 11:19 AM   Attending Nephrologist: [name], MD

## 2013-08-20 NOTE — Progress Notes (Signed)
PROGRESS NOTE  [name] [id number] DOB: [birth year] DOA: 08/19/2013 PCP: [name], MD  Assessment/Plan: right lower extremity weakness with generalized weakness  -? if patient's blood pressure has been dropped too quickly (with restart of minoxidil) resulting in hypoperfusion of brain which may have caused stroke  -MRI brain  - neurology  - cardiology--stated it was okay to perform MRI with loop recorder  -will not repeat stroke workup that pt just had done in first week of Feb 2015  -loop recorder has not captured any ectopy at her appt 2 weeks ago  -The patient had a TEE on 07/21/2013 which was negative for any emboli or PFO  -TSH ok -d/c ASA--->start plavix   Relative hypotension  -The patient's systolic blood pressure has been 190-200 per family  -hold labetolol and minoxidil  -labetolol prn for sbp >190  -blood cultures x 2    ESRD  -renal for maintenance HD   Leg edema and pain  -venous duplex to rule out dvt   History of Acute nonhemorrhagic Stroke (feb 2015)  -MRI showed acute/subacute bilateral CVA.  -TEE negative for source of emboli or PFO  -loop recorder to be placed 07/22/23  -Check hemoglobin A1c--7.1, LDL 68  -carotid ultrasound negative  -per neurology recommendations, pt will be started on ASA 325mg  daily upon d/c  -07/21/13 MRA brain negative for any hemodynamic significant stenosis   Acute encephalopathy  -Likely related to the patient's relative hypotension  -Check serum B12, TSH, ammonia, RBC folate - all WNL -EKG and troponin in pt with sob and diaphoresis negative -UA neg for pyuria   Diabetes mellitus type 2  -Patient is on NPH insulin, give half of the dose while she is in the hospital, we might need to adjust that in the morning.  -Insulin sliding scale  -Patient has history of gastroparesis  -Daughter states that patient was not able to tolerate Reglan due agitation and mental status changes   Gastroparesis  -Unable to  tolerate metoclopramide as discussed above  -continue erythromycin   History of sundowning  -Family patient has history of severe confusion at night  History of grade 2 diastolic dysfunction  -With lower extremity edema, blood pressure is controlled, patient for dialysis for volume control.      Code Status: full Family Communication: patient/daughter at bedside Disposition Plan:    Consultants:  Neuro  Renal  Procedures:    Antibiotics:    HPI/Subjective: Patient more awake today Speaking but does not know where she is  Objective: Filed Vitals:   08/20/13 0926  BP: 115/67  Pulse: 99  Temp: 99.3 F (37.4 C)  Resp: 16    Intake/Output Summary (Last 24 hours) at 08/20/13 0957 Last data filed at 08/19/13 1800  Gross per 24 hour  Intake    240 ml  Output      0 ml  Net    240 ml   Filed Weights   08/19/13 1742  Weight: 74.6 kg (164 lb 7.4 oz)    Exam:   General:  Pleasant/cooperative  Cardiovascular: rrr  Respiratory: clear anterior  Abdomen: +BS, soft  Musculoskeletal: weakness overall, no facial droop   Data Reviewed: Basic Metabolic Panel:  Recent Labs Lab 08/19/13 1210 08/20/13 0057  NA 142 143  K 4.3 3.8  CL 103 104  CO2 25 24  GLUCOSE 165* 136*  BUN 28* 32*  CREATININE 5.03* 5.65*  CALCIUM 8.9 8.6   Liver Function Tests: No results found for this basename:  AST, ALT, ALKPHOS, BILITOT, PROT, ALBUMIN,  in the last 168 hours No results found for this basename: LIPASE, AMYLASE,  in the last 168 hours  Recent Labs Lab 08/19/13 1900  AMMONIA 28   CBC:  Recent Labs Lab 08/19/13 1210 08/20/13 0057  WBC 10.1 8.2  HGB 9.6* 8.5*  HCT 28.8* 25.8*  MCV 86.7 86.0  PLT 208 206   Cardiac Enzymes:  Recent Labs Lab 08/19/13 1900 08/20/13 0057 08/20/13 0850  TROPONINI <0.30 <0.30 <0.30   BNP (last 3 results) No results found for this basename: PROBNP,  in the last 8760 hours CBG:  Recent Labs Lab 08/19/13 1203  08/19/13 2156 08/20/13 0638  GLUCAP 156* 151* 128*    No results found for this or any previous visit (from the past 240 hour(s)).   Studies: Dg Chest 2 View  08/19/2013   CLINICAL DATA:  Weakness, confusion.  EXAM: CHEST  2 VIEW  COMPARISON:  07/20/2013  FINDINGS: Mild cardiomegaly. Vascular congestion. Bibasilar airspace opacities likely reflect atelectasis. Suspect small layering effusions. Right dialysis catheter is in place with the tip in the right atrium, unchanged. No acute bony abnormality.  IMPRESSION: Cardiomegaly with vascular congestion.  Small bilateral effusions with bibasilar densities, likely atelectasis.   Electronically Signed   By: Rolm Baptise M.D.   On: 08/19/2013 13:01   Ct Head (brain) Wo Contrast  08/19/2013   CLINICAL DATA:  73 year old with weakness. Patient was unable to walk.  EXAM: CT HEAD WITHOUT CONTRAST  TECHNIQUE: Contiguous axial images were obtained from the base of the skull through the vertex without contrast.  COMPARISON:  06/25/2013  FINDINGS: Chronic calcifications in the basal ganglia. No evidence for acute hemorrhage, mass lesion, midline shift, hydrocephalus or large infarct. Chronic low-density in the periventricular and subcortical white matter, particularly in the parietal lobes. Visualized sinuses are clear. No acute bone abnormality.  IMPRESSION: No acute intracranial abnormality.  White matter changes are suggestive for chronic small vessel ischemic disease.   Electronically Signed   By: Markus Daft M.D.   On: 08/19/2013 14:00    Scheduled Meds: . calcium carbonate  1 tablet Oral TID WC  . clopidogrel  75 mg Oral Q breakfast  . erythromycin  250 mg Oral TID WC & HS  . heparin  5,000 Units Subcutaneous 3 times per day  . insulin aspart  0-9 Units Subcutaneous TID WC  . insulin NPH Human  5 Units Subcutaneous BID AC & HS  . multivitamin  1 tablet Oral QHS  . pantoprazole  40 mg Oral Q supper  . sodium chloride  3 mL Intravenous Q12H   Continuous  Infusions:  Antibiotics Given (last 72 hours)   Date/Time Action Medication Dose   08/19/13 2158 Given   erythromycin (ERY-TAB) EC tablet 250 mg 250 mg      Active Problems:   DIABETES MELLITUS, TYPE II   HYPERLIPIDEMIA   HYPERTENSION   End stage renal disease   Right hemiparesis    Time spent: 35 min    VANN, JESSICA  [facility] Pager [phone]. If 7PM-7AM, please contact night-coverage at [email or web address], password [facility] 08/20/2013, 9:57 AM  LOS: 1 day

## 2013-08-20 NOTE — Evaluation (Addendum)
Physical Therapy Evaluation Patient Details Name: [name] MRN: [id number] DOB: 06/12/1941 Today's Date: 08/20/2013 Time: [id number] PT Time Calculation (min): 26 min  PT Assessment / Plan / Recommendation History of Present Illness   73 yo female adm to [facility] with right sided weakness, HTN, SOB. Pmh + pt requiring dialysis, HTN, delayed gastric emptying per study 06/30/2013. Pt CXR 3/6 negative - bilateral densities likely atx. CT head 3/6 negative, pt for MRI. Family reports pt with coughing over the last 2 weeks - not necessarily associated with po intake. Pt with communication difficulties today - delayed initiation, ? motor planning deficits, ? right sided neglect   Clinical Impression  Pt admitted with above. Pt currently with functional limitations due to the deficits listed below (see PT Problem List). Pt much weaker than prior to admit with significant impairments.  Will benefit from Rehab prior to d/c home.   Pt will benefit from skilled PT to increase their independence and safety with mobility to allow discharge to the venue listed below.     PT Assessment  Patient needs continued PT services    Follow Up Recommendations  CIR;Supervision/Assistance - 24 hour    Does the patient have the potential to tolerate intense rehabilitation      Barriers to Discharge        Equipment Recommendations  Other (comment) (TBA)    Recommendations for Other Services Rehab consult   Frequency Min 4X/week    Precautions / Restrictions Precautions Precautions: Fall Restrictions Weight Bearing Restrictions: No   Pertinent Vitals/Pain VSS, No pain      Mobility  Bed Mobility Overal bed mobility: Needs Assistance;+2 for physical assistance Bed Mobility: Supine to Sit Supine to sit: +2 for physical assistance;Total assist General bed mobility comments: Pt not initiating movement.  total assist to asssit to EOB.   Transfers Overall transfer level: Needs assistance Equipment  used: None Transfers: Sit to/from [facility] Sit to Stand: Max assist;+2 physical assistance Stand pivot transfers: Max assist;+2 physical assistance General transfer comment: BLocked both of pts knees and pt able to stand partially and weight bear to pivot to recliner with assist to move LEs.  Pt unable to assist much with transfer.  Poor motor processing as well as appears to have right neglect.   Modified Rankin (Stroke Patients Only) Pre-Morbid Rankin Score: Moderate disability Modified Rankin: Severe disability    Exercises     PT Diagnosis: Generalized weakness  PT Problem List: Decreased strength;Decreased activity tolerance;Decreased balance;Decreased mobility;Decreased coordination;Decreased knowledge of use of DME PT Treatment Interventions: DME instruction;Gait training;Stair training;Functional mobility training;Therapeutic activities;Therapeutic exercise;Balance training;Neuromuscular re-education;Patient/family education     PT Goals(Current goals can be found in the care plan section) Acute Rehab PT Goals Patient Stated Goal: Home PT Goal Formulation: With patient Time For Goal Achievement: 08/27/13 Potential to Achieve Goals: Fair  Visit Information  Last PT Received On: 08/20/13 Assistance Needed: +2 History of Present Illness: Pt presents with ? left CVA.  Pt awaiting MRI.         Prior [place] expects to be discharged to:: Private residence Living Arrangements: Children Available Help at Discharge: Family;Available 24 hours/day Type of Home: House Home Access: Stairs to enter [employer] of Steps: 1 Entrance Stairs-Rails: None Home Layout: One level Home Equipment: Walker - 4 wheels;Bedside commode;Tub bench Prior Function Level of Independence: Needs assistance ADL's / Homemaking Assistance Needed: Family performs all homemaking and has been asissting pt recently with bathing.  Communication Communication: Expressive difficulties;Receptive difficulties Dominant Hand: Right    Cognition  Cognition Arousal/Alertness: Awake/alert Behavior During Therapy: Flat affect Overall Cognitive Status: Impaired/Different from baseline Area of Impairment: Orientation;Attention;Following commands;Memory;Awareness;Safety/judgement;Problem solving Orientation Level: Disoriented to;Time;Situation Current Attention Level: Focused Memory: Decreased short-term memory Following Commands: Follows one step commands inconsistently Safety/Judgement: Decreased awareness of safety;Decreased awareness of deficits Problem Solving: Slow processing;Difficulty sequencing;Requires verbal cues;Requires tactile cues General Comments: 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  Pt unable to follow commands.  ? aphasia as well as possible right sided neglect.      Extremity/Trunk Assessment Upper Extremity Assessment Upper Extremity Assessment: Defer to OT evaluation Lower Extremity Assessment Lower Extremity Assessment: RLE deficits/detail;LLE deficits/detail RLE Deficits / Details: unable to assess fully due to pt not following commands for movement.  Appears 2/5.   LLE Deficits / Details: Pt not following commands.  Appears at least 2+/5 Cervical / Trunk Assessment Cervical / Trunk Assessment: Kyphotic   Balance Balance Overall balance assessment: Needs assistance;History of Falls Sitting-balance support: Feet supported;Single extremity supported Sitting balance-Leahy Scale: Poor Sitting balance - Comments: Pt sat EOB x 10 min with min guard to total assist as pt at times with posterior lean.  Pt with kyphotic posture and flexed trunk.  Pt demonstrates some postural control but had difficulty maintaining without constant cuing.   Postural control: Posterior lean  End of Session PT - End of Session Equipment Utilized During Treatment:  Gait belt Activity Tolerance: Patient limited by fatigue Patient left: in chair;with call bell/phone within reach;with family/visitor present Nurse Communication: Mobility status;Need for lift equipment  GP     INGOLD,Kirsty Monjaraz 08/20/2013, 10:06 AM  [facility] [phone] (pager)

## 2013-08-20 NOTE — Evaluation (Signed)
Clinical/Bedside Swallow Evaluation Patient Details  Name: [name] MRN: [id number] Date of Birth: 02/26/1941  Today's Date: 08/20/2013 Time: [id number] SLP Time Calculation (min): 27 min  Past Medical History:  Past Medical History  Diagnosis Date  . Hypertension   . Cataract   . Diabetes mellitus   . Chronic kidney disease   . Shortness of breath   . Anemia   . CVA (cerebral infarction)    Past Surgical History:  Past Surgical History  Procedure Laterality Date  . No past surgeries    . Cataract extraction w/phaco  11/20/2011    Procedure: CATARACT EXTRACTION PHACO AND INTRAOCULAR LENS PLACEMENT (IOC);  Surgeon: Tonny Branch, MD;  Location: [facility];  Service: Ophthalmology;  Laterality: Right;  CDE 18.82  . Cataract extraction w/phaco Left 11/18/2012    Procedure: CATARACT EXTRACTION PHACO AND INTRAOCULAR LENS PLACEMENT (IOC);  Surgeon: Tonny Branch, MD;  Location: [facility];  Service: Ophthalmology;  Laterality: Left;  CDE: 18.97  . Colonoscopy N/A 02/09/2013    Procedure: COLONOSCOPY;  Surgeon: Rogene Houston, MD;  Location: [facility];  Service: Endoscopy;  Laterality: N/A;  305-moved to 220 Ann to notify pt  . Insertion of dialysis catheter Right 06/24/2013    Procedure: INSERTION OF DIALYSIS CATHETER: Ultrasound guided;  Surgeon: Serafina Mitchell, MD;  Location: [facility];  Service: Vascular;  Laterality: Right;  . 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 Tee without cardioversion N/A 07/21/2013    Procedure: TRANSESOPHAGEAL ECHOCARDIOGRAM (TEE);  Surgeon: Dorothy Spark, MD;  Location: [facility] ENDOSCOPY;  Service: Cardiovascular;  Laterality: N/A;   HPI:  73 yo female adm to [facility] with right sided weakness, HTN, SOB.  Pmh + pt requiring dialysis, HTN, delayed gastric emptying per study 06/30/2013.  Pt CXR 3/6 negative - bilateral densities likely atx.  CT head 3/6 negative, pt for MRI.  Family reports pt with coughing over the last 2  weeks - not necessarily associated with po intake.  Pt with communication difficulties today - delayed initiation, ? motor planning deficits, ? right sided neglect.     Assessment / Plan / Recommendation Clinical Impression  Pt presents with clinical indications of moderate oral dysphagia - suspect pharyngeal swallow intact based on observation.  Pt accepted only a few small boluses including applejuice, water, peach and appleseauce.  Suspect motor planning deficits due to frontal lobe impairments.   Delayed oral transiting/mastication with minimal oral opening noted.  Delayed swallow apparent without s/s of aspiration.  Cough noted x1 after swallowing small snack.  To decrease aspiration risk due to oral deficits and delay, recommend pt consume modified diet of dys2/nectar - with tsps of thin water allowed between meals.  Pt does have h/o delayed gastric empyting therefore recommend small meals at a time.  Family present and thoroughly educated to findings, strategies, and recommendations returning demonstration.  SLP to follow, please order speech/language evaluation as pt with apparent cognling deficits.      Aspiration Risk  Mild    Diet Recommendation Dysphagia 2 (Fine chop);Nectar-thick liquid   Liquid Administration via: Cup;Straw Medication Administration: Crushed with puree Supervision: Full supervision/cueing for compensatory strategies;Trained caregiver to feed patient Compensations: Slow rate;Small sips/bites;Check for pocketing (observe swallow as pt swallow delayed) Postural Changes and/or Swallow Maneuvers: Seated upright 90 degrees;Upright 30-60 min after meal    Other  Recommendations Oral Care Recommendations: Oral care before and after PO (due to oral  deficits, check for stasis) Other Recommendations: Order thickener from pharmacy;Have oral suction available   Follow Up Recommendations  Inpatient Rehab;Skilled Nursing facility    Frequency and Duration min 2x/week  2 weeks    Pertinent Vitals/Pain Afebrile, decreased      Swallow Study Prior Functional Status  Type of Home: House Available Help at Discharge: Family;Available 24 hours/day    General Date of Onset: 08/20/13 HPI: 73 yo female adm to [facility] with right sided weakness, HTN, SOB.  Pmh + pt requiring dialysis, HTN, delayed gastric emptying per study 06/30/2013.  Pt CXR 3/6 negative - bilateral densities likely atx.  CT head 3/6 negative, pt for MRI.  Family reports pt with coughing over the last 2 weeks - not necessarily associated with po intake.  Pt with communication difficulties today - delayed initiation, ? motor planning deficits, ? right sided neglect.   Type of Study: Bedside swallow evaluation Diet Prior to this Study: Regular;Thin liquids Temperature Spikes Noted: No Respiratory Status: Room air History of Recent Intubation: No Behavior/Cognition: Alert;Requires cueing;Doesn't follow directions;Decreased sustained attention;Distractible Oral Cavity - Dentition: Dentures, top;Dentures, bottom Self-Feeding Abilities: Needs assist Patient Positioning: Upright in chair Baseline Vocal Quality: Clear Volitional Cough: Cognitively unable to elicit Volitional Swallow: Unable to elicit    Oral/Motor/Sensory Function Overall Oral Motor/Sensory Function:  (? motor planning deficits, pt did not cough on command, did not follow directions fully, able to protrude tongue and voice, ? right sided weakness)   Ice Chips Ice chips: Not tested Other Comments: due to large size of ice boluses and pt's oral manipulation difficulties   Thin Liquid Thin Liquid: Impaired Presentation: Straw;Spoon Oral Phase Impairments: Reduced lingual movement/coordination Oral Phase Functional Implications: Prolonged oral transit Pharyngeal  Phase Impairments: Suspected delayed Swallow    Nectar Thick Nectar Thick Liquid: Impaired Presentation: Cup;Spoon;Straw Oral Phase Impairments: Impaired anterior to posterior  transit Oral phase functional implications: Prolonged oral transit Pharyngeal Phase Impairments: Suspected delayed Swallow   Honey Thick Honey Thick Liquid: Not tested   Puree Puree: Impaired Presentation: Spoon Oral Phase Impairments: Impaired anterior to posterior transit;Reduced lingual movement/coordination Oral Phase Functional Implications: Prolonged oral transit Other Comments: pt accepts 1/2 tsp of applesauce at one time only   Solid   GO    Solid: Impaired Oral Phase Impairments: Impaired anterior to posterior transit;Reduced lingual movement/coordination;Impaired mastication Pharyngeal Phase Impairments: Suspected delayed Swallow       Claudie Fisherman, [facility] SLP [phone]

## 2013-08-21 DIAGNOSIS — M7989 Other specified soft tissue disorders: Secondary | ICD-10-CM

## 2013-08-21 DIAGNOSIS — M79609 Pain in unspecified limb: Secondary | ICD-10-CM

## 2013-08-21 DIAGNOSIS — I959 Hypotension, unspecified: Secondary | ICD-10-CM

## 2013-08-21 LAB — GLUCOSE, CAPILLARY
GLUCOSE-CAPILLARY: 193 mg/dL — AB (ref 70–99)
GLUCOSE-CAPILLARY: 221 mg/dL — AB (ref 70–99)
Glucose-Capillary: 76 mg/dL (ref 70–99)
Glucose-Capillary: 105 mg/dL — ABNORMAL HIGH (ref 70–99)

## 2013-08-21 LAB — FOLATE RBC

## 2013-08-21 LAB — HEPATITIS B CORE ANTIBODY, TOTAL: Hep B Core Total Ab: NONREACTIVE

## 2013-08-21 LAB — HEPATITIS B SURFACE ANTIBODY,QUALITATIVE: HEP B S AB: NEGATIVE

## 2013-08-21 LAB — HEPATITIS B SURFACE ANTIGEN: Hepatitis B Surface Ag: NEGATIVE

## 2013-08-21 MED ORDER — SODIUM CHLORIDE 0.9 % IV SOLN
62.5000 mg | INTRAVENOUS | Status: DC
  Administered 2013-08-23: 62.5 mg via INTRAVENOUS
  Filled 2013-08-21 (×2): qty 5

## 2013-08-21 MED ORDER — GUAIFENESIN 100 MG/5ML PO SOLN
5.0000 mL | ORAL | Status: DC | PRN
  Administered 2013-08-21 – 2013-08-22 (×3): 100 mg via ORAL
  Filled 2013-08-21 (×4): qty 5

## 2013-08-21 MED ORDER — DOXERCALCIFEROL 4 MCG/2ML IV SOLN
1.0000 ug | INTRAVENOUS | Status: DC
  Administered 2013-08-23: 1 ug via INTRAVENOUS
  Filled 2013-08-21: qty 2

## 2013-08-21 MED ORDER — DARBEPOETIN ALFA-POLYSORBATE 60 MCG/0.3ML IJ SOLN
60.0000 ug | INTRAMUSCULAR | Status: DC
  Administered 2013-08-23: 60 ug via INTRAVENOUS
  Filled 2013-08-21: qty 0.3

## 2013-08-21 MED ORDER — PANTOPRAZOLE SODIUM 40 MG PO PACK
40.0000 mg | PACK | Freq: Every day | ORAL | Status: DC
  Administered 2013-08-21 – 2013-08-22 (×2): 40 mg via ORAL
  Filled 2013-08-21 (×3): qty 20

## 2013-08-21 MED ORDER — LABETALOL HCL 5 MG/ML IV SOLN
5.0000 mg | INTRAVENOUS | Status: DC | PRN
  Administered 2013-08-21 – 2013-08-23 (×5): 5 mg via INTRAVENOUS
  Filled 2013-08-21 (×7): qty 4

## 2013-08-21 NOTE — Progress Notes (Signed)
Admit: 08/19/2013 LOS: 2  45F ESRD TDC THS [facility] with AMS and watershed infarct CVAs  Subjective:  HD yesterday, 1.8L UF Feels well today, appears better Pt w/o complaints BP somewhat up, off BP meds right now   03/07 0701 - 03/08 0700 In: 540 [P.O.:540] Out: 1800   Filed Weights   08/19/13 1742 08/20/13 1730 08/20/13 2130  Weight: 74.6 kg (164 lb 7.4 oz) 77.1 kg (169 lb 15.6 oz) 74.8 kg (164 lb 14.5 oz)    Current meds: reviewed  Current Labs: reviewed   Outpt HD Orders Unit: [facility]  Days: THS Time: 4H EDW: 75Kg K/Ca: 3K/2.5Ca Access: TDC R IJ Needle Size: na BFR: 350 DFR: 600 VDRA: 1 mcg hectorol qTx EPO: 7700 Epo qTx IV Fe: Venofer 50 qWed Heparin: none   Physical Exam:  Blood pressure 153/93, pulse 102, temperature 98.7 F (37.1 C), temperature source Oral, resp. rate 18, height 5\' 6"  (1.676 m), weight 74.8 kg (164 lb 14.5 oz), SpO2 98.00%. Pleasant, smiling RRR, nl s1s2. No rub CTAB TDC bandaged and c/d/i No LEE No rashes  Assessment 1. AMS and new watershed infarcts on MRI after adding minoxidil 2. ESRD 3. Anemia 4. 2HPTH 5. HTN/Vol 6. DM2  PLAN 1. I favor more liberal BP goals with targe tSBP in 150s and DBP in 80-90s at minimum given nature of infarcts 2. Cont on THS schedule 3. 75kg EDW seems reasonable 4. Cont VDRA, TUMS 5. Cont EPO, Fe  [name] MD 08/21/2013, 10:37 AM   Recent Labs Lab 08/19/13 1210 08/20/13 0057  NA 142 143  K 4.3 3.8  CL 103 104  CO2 25 24  GLUCOSE 165* 136*  BUN 28* 32*  CREATININE 5.03* 5.65*  CALCIUM 8.9 8.6    Recent Labs Lab 08/19/13 1210 08/20/13 0057  WBC 10.1 8.2  HGB 9.6* 8.5*  HCT 28.8* 25.8*  MCV 86.7 86.0  PLT 208 206

## 2013-08-21 NOTE — Progress Notes (Signed)
Rehab Admissions Coordinator Note:  Patient was screened by Cleatrice Burke for appropriateness for an Inpatient Acute Rehab Consult.  At this time, we await completion of rehab consult on Monday..  08/21/2013, 6:37 PM  I can be reached at [phone].

## 2013-08-21 NOTE — Progress Notes (Signed)
PROGRESS NOTE  [name] [id number] DOB: 11/20/1940 DOA: 08/19/2013 PCP: [name], MD  Assessment/Plan: right lower extremity weakness with generalized weakness  -watershed due to hypoperfusion of brain due to hypotension -MRI brain: Scattered acute infarcts throughout both cerebral and cerebellar hemispheres. Findings could reflect watershed ischemia - neurology  - cardiology--stated it was okay to perform MRI with loop recorder  -will not repeat stroke workup that pt just had done in first week of Feb 2015  -loop recorder has not captured any ectopy at her appt 2 weeks ago  -The patient had a TEE on 07/21/2013 which was negative for any emboli or PFO  -TSH ok -d/c ASA--->start plavix  -PT: CIR  Cough- -look for signs of aspiration  Relative hypotension  -hold labetolol and minoxidil - will allow a high BP so during dialysis patient does not drop too low -labetolol prn for sbp >190  -blood cultures x 2 NGTD   ESRD  -renal for maintenance HD   Leg edema and pain  -venous duplex to rule out dvt   History of Acute nonhemorrhagic Stroke (feb 2015)  -MRI showed acute/subacute bilateral CVA.  -TEE negative for source of emboli or PFO  -loop recorder on 07/22/23  -Check hemoglobin A1c--7.1, LDL 68  -carotid ultrasound negative  -per neurology recommendations, pt will be started on ASA 325mg  daily upon d/c  -07/21/13 MRA brain negative for any hemodynamic significant stenosis   Acute encephalopathy  -Likely related to the patient's relative hypotension  -Check serum B12, TSH, ammonia, RBC folate - all WNL -EKG and troponin in pt with sob and diaphoresis negative -UA neg for pyuria   Diabetes mellitus type 2  -Patient is on NPH insulin, give half of the dose while she is in the hospital, we might need to adjust that in the morning.  -Insulin sliding scale  -Patient has history of gastroparesis  -Daughter states that patient was not able to tolerate Reglan due  agitation and mental status changes   Gastroparesis  -Unable to tolerate metoclopramide as discussed above  -continue erythromycin   History of sundowning  -Family patient has history of severe confusion at night  History of grade 2 diastolic dysfunction  -With lower extremity edema, blood pressure is controlled, patient for dialysis for volume control.      Code Status: full Family Communication: patient/daughter at bedside Disposition Plan:    Consultants:  Neuro  Renal  rehab  Procedures:    Antibiotics:    HPI/Subjective: Awake C/o cough- not productive- not associated with eating  Objective: Filed Vitals:   08/21/13 1018  BP: 153/93  Pulse: 102  Temp: 98.7 F (37.1 C)  Resp: 18    Intake/Output Summary (Last 24 hours) at 08/21/13 1040 Last data filed at 08/20/13 2142  Gross per 24 hour  Intake    390 ml  Output   1800 ml  Net  -1410 ml   Filed Weights   08/19/13 1742 08/20/13 1730 08/20/13 2130  Weight: 74.6 kg (164 lb 7.4 oz) 77.1 kg (169 lb 15.6 oz) 74.8 kg (164 lb 14.5 oz)    Exam:   General:  Pleasant/cooperative  Cardiovascular: rrr  Respiratory: clear anterior, no wheezing  Abdomen: +BS, soft  Musculoskeletal: weakness overall, no facial droop   Data Reviewed: Basic Metabolic Panel:  Recent Labs Lab 08/19/13 1210 08/20/13 0057  NA 142 143  K 4.3 3.8  CL 103 104  CO2 25 24  GLUCOSE 165* 136*  BUN 28* 32*  CREATININE 5.03* 5.65*  CALCIUM 8.9 8.6   Liver Function Tests: No results found for this basename: AST, ALT, ALKPHOS, BILITOT, PROT, ALBUMIN,  in the last 168 hours No results found for this basename: LIPASE, AMYLASE,  in the last 168 hours  Recent Labs Lab 08/19/13 1900  AMMONIA 28   CBC:  Recent Labs Lab 08/19/13 1210 08/20/13 0057  WBC 10.1 8.2  HGB 9.6* 8.5*  HCT 28.8* 25.8*  MCV 86.7 86.0  PLT 208 206   Cardiac Enzymes:  Recent Labs Lab 08/19/13 1900 08/20/13 0057 08/20/13 0850    TROPONINI <0.30 <0.30 <0.30   BNP (last 3 results) No results found for this basename: PROBNP,  in the last 8760 hours CBG:  Recent Labs Lab 08/20/13 0638 08/20/13 1132 08/20/13 1636 08/20/13 2312 08/21/13 0551  GLUCAP 128* 147* 139* 106* 76    Recent Results (from the past 240 hour(s))  CULTURE, BLOOD (ROUTINE X 2)     Status: None   Collection Time    08/19/13  6:45 PM      Result Value Ref Range Status   Specimen Description BLOOD RIGHT ARM   Final   Special Requests BOTTLES DRAWN AEROBIC ONLY 5CC   Final   Culture  Setup Time     Final   Value: 08/19/2013 22:13     Performed at [facility]   Culture     Final   Value:        BLOOD CULTURE RECEIVED NO GROWTH TO DATE CULTURE WILL BE HELD FOR 5 DAYS BEFORE ISSUING A FINAL NEGATIVE REPORT     Performed at [facility]   Report Status PENDING   Incomplete  CULTURE, BLOOD (ROUTINE X 2)     Status: None   Collection Time    08/19/13  7:00 PM      Result Value Ref Range Status   Specimen Description BLOOD RIGHT HAND   Final   Special Requests BOTTLES DRAWN AEROBIC ONLY 4CC   Final   Culture  Setup Time     Final   Value: 08/19/2013 22:14     Performed at [facility]   Culture     Final   Value:        BLOOD CULTURE RECEIVED NO GROWTH TO DATE CULTURE WILL BE HELD FOR 5 DAYS BEFORE ISSUING A FINAL NEGATIVE REPORT     Performed at [facility]   Report Status PENDING   Incomplete     Studies: Dg Chest 2 View  08/19/2013   CLINICAL DATA:  Weakness, confusion.  EXAM: CHEST  2 VIEW  COMPARISON:  07/20/2013  FINDINGS: Mild cardiomegaly. Vascular congestion. Bibasilar airspace opacities likely reflect atelectasis. Suspect small layering effusions. Right dialysis catheter is in place with the tip in the right atrium, unchanged. No acute bony abnormality.  IMPRESSION: Cardiomegaly with vascular congestion.  Small bilateral effusions with bibasilar densities, likely atelectasis.   Electronically  Signed   By: Rolm Baptise M.D.   On: 08/19/2013 13:01   Ct Head (brain) Wo Contrast  08/19/2013   CLINICAL DATA:  73 year old with weakness. Patient was unable to walk.  EXAM: CT HEAD WITHOUT CONTRAST  TECHNIQUE: Contiguous axial images were obtained from the base of the skull through the vertex without contrast.  COMPARISON:  06/25/2013  FINDINGS: Chronic calcifications in the basal ganglia. No evidence for acute hemorrhage, mass lesion, midline shift, hydrocephalus or large infarct. Chronic low-density in the periventricular and subcortical white matter,  particularly in the parietal lobes. Visualized sinuses are clear. No acute bone abnormality.  IMPRESSION: No acute intracranial abnormality.  White matter changes are suggestive for chronic small vessel ischemic disease.   Electronically Signed   By: Markus Daft M.D.   On: 08/19/2013 14:00   Mr Brain Wo Contrast  08/20/2013   CLINICAL DATA:  Right lower extremity weakness.  EXAM: MRI HEAD WITHOUT CONTRAST  TECHNIQUE: Multiplanar, multiecho pulse sequences of the brain and surrounding structures were obtained without intravenous contrast.  COMPARISON:  Head CT 08/19/2013 and MRI 07/19/2013  FINDINGS: There are numerous small foci of acute infarction involving both cortex as well as subcortical and deep white matter in the bilateral frontal and parietal lobes as well as in both cerebellar hemispheres. Greatest involvement is in the white matter of the centrum semiovale bilaterally. There is mild associated edema without mass effect. There is no evidence of intracranial hemorrhage, mass, midline shift, or extra-axial fluid collection. Mild to moderate cerebral atrophy is unchanged. Moderate white matter chronic small vessel ischemic change is noted.  Prior bilateral cataract surgery is noted. 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 There is mild bilateral sphenoid sinus mucosal thickening. Mastoid air cells are clear. Major intracranial vascular flow voids are  preserved.  IMPRESSION: Scattered acute infarcts throughout both cerebral and cerebellar hemispheres. Findings could reflect watershed ischemia.   Electronically Signed   By: Logan Bores   On: 08/20/2013 13:45    Scheduled Meds: . calcium carbonate  1 tablet Oral TID WC  . clopidogrel  75 mg Oral Q breakfast  . erythromycin  250 mg Oral TID WC & HS  . heparin  5,000 Units Subcutaneous 3 times per day  . insulin aspart  0-9 Units Subcutaneous TID WC  . insulin NPH Human  5 Units Subcutaneous BID AC & HS  . multivitamin  1 tablet Oral QHS  . pantoprazole  40 mg Oral Q supper  . sodium chloride  3 mL Intravenous Q12H   Continuous Infusions:  Antibiotics Given (last 72 hours)   Date/Time Action Medication Dose   08/19/13 2158 Given   erythromycin (ERY-TAB) EC tablet 250 mg 250 mg   08/20/13 1058 Given   erythromycin (ERY-TAB) EC tablet 250 mg 250 mg   08/20/13 1321 Given  [p7 was off unit]   erythromycin (ERY-TAB) EC tablet 250 mg 250 mg      Active Problems:   DIABETES MELLITUS, TYPE II   HYPERLIPIDEMIA   HYPERTENSION   End stage renal disease   Right hemiparesis    Time spent: 25 min    Legacy Lacivita  [facility] Pager [phone]. If 7PM-7AM, please contact night-coverage at [email or web address], password [facility] 08/21/2013, 10:40 AM  LOS: 2 days

## 2013-08-22 DIAGNOSIS — I6340 Cerebral infarction due to embolism of unspecified cerebral artery: Secondary | ICD-10-CM

## 2013-08-22 LAB — GLUCOSE, CAPILLARY
GLUCOSE-CAPILLARY: 141 mg/dL — AB (ref 70–99)
Glucose-Capillary: 140 mg/dL — ABNORMAL HIGH (ref 70–99)
Glucose-Capillary: 250 mg/dL — ABNORMAL HIGH (ref 70–99)
Glucose-Capillary: 226 mg/dL — ABNORMAL HIGH (ref 70–99)

## 2013-08-22 NOTE — Consult Note (Signed)
Physical Medicine and Rehabilitation Consult Reason for Consult: CVA Referring Physician: [facility]   HPI: [name] is a 73 y.o. 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 Admitted 08/19/2013 with right side weakness as well as altered mental status. MRI of the brain showed scattered acute infarcts throughout both cerebral and cerebellar hemispheres. Followup neurology services aspirin discontinued placed on Plavix. A loop recorder in place from previous admission. No further stroke workup indicated. Hemodialysis ongoing as per renal services. Subcutaneous heparin for DVT prophylaxis. Currently maintained on dysphagia 2 nectar thick liquid diet. Physical and occupational therapy evaluations completed 08/20/2013 with recommendations for physical medicine rehabilitation consult.   Review of Systems  Respiratory: Positive for shortness of breath.   Gastrointestinal: Positive for constipation.  Neurological: Positive for weakness.  All other systems reviewed and are negative.   Past Medical History  Diagnosis Date  . Hypertension   . Cataract   . Diabetes mellitus   . Chronic kidney disease   . Shortness of breath   . Anemia   . CVA (cerebral infarction)    Past Surgical History  Procedure Laterality Date  . No past surgeries    . Cataract extraction w/phaco  11/20/2011    Procedure: CATARACT EXTRACTION PHACO AND INTRAOCULAR LENS PLACEMENT (IOC);  Surgeon: Tonny Branch, MD;  Location: [facility];  Service: Ophthalmology;  Laterality: Right;  CDE 18.82  . Cataract extraction w/phaco Left 11/18/2012    Procedure: CATARACT EXTRACTION PHACO AND INTRAOCULAR LENS PLACEMENT (IOC);   Surgeon: Tonny Branch, MD;  Location: [facility];  Service: Ophthalmology;  Laterality: Left;  CDE: 18.97  . Colonoscopy N/A 02/09/2013    Procedure: COLONOSCOPY;  Surgeon: Rogene Houston, MD;  Location: [facility];  Service: Endoscopy;  Laterality: N/A;  305-moved to 220 Ann to notify pt  . Insertion of dialysis catheter Right 06/24/2013    Procedure: INSERTION OF DIALYSIS CATHETER: Ultrasound guided;  Surgeon: Serafina Mitchell, MD;  Location: [place];  Service: Vascular;  Laterality: Right;  . 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 Tee without cardioversion N/A 07/21/2013    Procedure: TRANSESOPHAGEAL ECHOCARDIOGRAM (TEE);  Surgeon: Dorothy Spark, MD;  Location: [facility] END[facility]COPY;  Service: Cardiovascular;  Laterality: N/A;   Family History  Problem Relation Age of Onset  . Anesthesia problems Neg Hx   . Hypotension Neg Hx   . Malignant hyperthermia Neg Hx   . Pseudochol deficiency Neg Hx   . Cancer Sister   . Cancer Brother    Social History:  reports that she has never smoked. She has never used smokeless tobacco. She reports that she does not drink alcohol or use illicit drugs. Allergies:  Allergies  Allergen Reactions  . Ambien [Zolpidem] Other (See Comments)    Hallucinations   . Reglan [Metoclopramide] Other (See Comments)    Hallucinations    Medications Prior to Admission  Medication Sig Dispense Refill  . acetaminophen (TYLENOL) 325 MG tablet Take 650 mg by mouth every 6 (six) hours as needed (pain).      Marland Kitchen aspirin 325 MG tablet Take 325 mg by mouth daily.      . calcium carbonate (TUMS -  DOSED IN MG ELEMENTAL CALCIUM) 500 MG chewable tablet Chew 1 tablet by mouth 3 (three) times daily with meals.      . Cyanocobalamin (VITAMIN B-12 IJ) Inject 1 application as directed every 30 (thirty) days.      . insulin NPH Human (HUMULIN N,NOVOLIN N) 100 UNIT/ML injection Inject 10 Units into the skin 2 (two) times daily at 8 am and 10 pm.  10  mL  1  . labetalol (NORMODYNE) 200 MG tablet Take 200 mg by mouth 2 (two) times daily.      . multivitamin (RENA-VIT) TABS tablet Take 1 tablet by mouth at bedtime.  30 tablet  0  . pantoprazole (PROTONIX) 40 MG tablet Take 1 tablet (40 mg total) by mouth daily with supper.  30 tablet  0  . sodium chloride 0.9 % SOLN 100 mL with ferric gluconate 12.5 MG/ML SOLN 125 mg Inject 125 mg into the vein every Monday, Wednesday, and Friday with hemodialysis.        Home: Home Living Family/patient expects to be discharged to:: Private residence Living Arrangements: Children Available Help at Discharge: Family;Available 24 hours/day Type of Home: House Home Access: Stairs to enter [employer] of Steps: 1 Entrance Stairs-Rails: None Home Layout: One level Home Equipment: Walker - 4 wheels;Bedside commode;Tub bench  Functional History:   Functional Status:  Mobility:          ADL: ADL Grooming: Wash/dry hands;Wash/dry face;Teeth care;Simulated;+1 Total assistance Upper Body Bathing: +1 Total assistance Lower Body Bathing: +1 Total assistance Upper Body Dressing: +1 Total assistance Lower Body Dressing: +1 Total assistance Toilet Transfer: +1 Total assistance ADL Comments: Pt with cognitive deficits affecting all aspects of ADLs. pt unable to sustain attention to task. pt with apraxic motor movements and difficulty with initiation. Pt rocking at eob in a setting rocking pattern. pt unable to sustain bil LE on floor in static sitting. pt singing prior to entry to just sing.  Cognition: Cognition Overall Cognitive Status: Impaired/Different from baseline Orientation Level: Oriented to person;Disoriented to place;Disoriented to time;Disoriented to situation Cognition Arousal/Alertness: Awake/alert Behavior During Therapy: Flat affect Overall Cognitive Status: Impaired/Different from baseline Area of Impairment: Orientation;Attention;Memory;Following  commands;Safety/judgement;Awareness;Problem solving Orientation Level: Disoriented to;Person;Place;Time;Situation Current Attention Level: Focused Memory: Decreased recall of precautions;Decreased short-term memory Following Commands: Follows one step commands inconsistently Safety/Judgement: Decreased awareness of safety;Decreased awareness of deficits Awareness: Emergent;Anticipatory;Intellectual Problem Solving: Slow processing;Decreased initiation;Difficulty sequencing;Requires verbal cues;Requires tactile cues General Comments: Pt unable to verbalize who visitors in the room are and calls them by incorrect names. x2 daughters present. pt unable to give location ( names [place] and shocked when educated [place]) Pt unable to give full DOB. Pt reports with very delayed response [birth year]--. Pt unable to provided year for DOB with cueing provided. Pt able to recall information stated after 15 seconds, 30 seconds but delayed initiation to respond to command. Pt repeating back to therapist commands with no action to command.   Blood pressure 167/74, pulse 93, temperature 98.8 F (37.1 C), temperature source Oral, resp. rate 18, height 5\' 6"  (1.676 m), weight 74.8 kg (164 lb 14.5 oz), SpO2 95.00%. Physical Exam  Vitals reviewed. HENT:  Head: Normocephalic.  Eyes: EOM are normal.  Neck: Normal range of motion. Neck supple. No thyromegaly present.  Cardiovascular: Normal rate and regular rhythm.   Respiratory: Effort normal and breath sounds normal. No respiratory distress.  GI: Soft. Bowel sounds are normal. She exhibits no distension.  Neurological: She is alert.  Patient  was able to provide her name, age and date of birth. She followed simple commands. Ataxic UE and LE. Mild weakness 3+/5 prox to 4/5 distally UE's. 3- to 4/5 in LE's  Skin: Skin is warm and dry.  Psychiatric: She has a normal mood and affect. Her behavior is normal.    Results for orders placed during the hospital  encounter of 08/19/13 (from the past 24 hour(s))  GLUCOSE, CAPILLARY     Status: Abnormal   Collection Time    08/21/13 11:57 AM      Result Value Ref Range   Glucose-Capillary 105 (*) 70 - 99 mg/dL  GLUCOSE, CAPILLARY     Status: Abnormal   Collection Time    08/21/13  4:12 PM      Result Value Ref Range   Glucose-Capillary 193 (*) 70 - 99 mg/dL  GLUCOSE, CAPILLARY     Status: Abnormal   Collection Time    08/21/13 10:57 PM      Result Value Ref Range   Glucose-Capillary 221 (*) 70 - 99 mg/dL   Comment 1 Documented in Chart     Comment 2 Notify RN     Mr Brain Wo Contrast  08/20/2013   CLINICAL DATA:  Right lower extremity weakness.  EXAM: MRI HEAD WITHOUT CONTRAST  TECHNIQUE: Multiplanar, multiecho pulse sequences of the brain and surrounding structures were obtained without intravenous contrast.  COMPARISON:  Head CT 08/19/2013 and MRI 07/19/2013  FINDINGS: There are numerous small foci of acute infarction involving both cortex as well as subcortical and deep white matter in the bilateral frontal and parietal lobes as well as in both cerebellar hemispheres. Greatest involvement is in the white matter of the centrum semiovale bilaterally. There is mild associated edema without mass effect. There is no evidence of intracranial hemorrhage, mass, midline shift, or extra-axial fluid collection. Mild to moderate cerebral atrophy is unchanged. Moderate white matter chronic small vessel ischemic change is noted.  Prior bilateral cataract surgery is noted. 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 There is mild bilateral sphenoid sinus mucosal thickening. Mastoid air cells are clear. Major intracranial vascular flow voids are preserved.  IMPRESSION: Scattered acute infarcts throughout both cerebral and cerebellar hemispheres. Findings could reflect watershed ischemia.   Electronically Signed   By: Logan Bores   On: 08/20/2013 13:45    Assessment/Plan: Diagnosis: bicerebral infarcts 1. Does the need  for close, 24 hr/day medical supervision in concert with the patient's rehab needs make it unreasonable for this patient to be served in a less intensive setting? Yes 2. Co-Morbidities requiring supervision/potential complications: htn, ESRD 3. Due to bladder management, bowel management, safety, skin/wound care, disease management, medication administration, pain management and patient education, does the patient require 24 hr/day rehab nursing? Yes 4. Does the patient require coordinated care of a physician, rehab nurse, PT (1-2 hrs/day, 5 days/week), OT (1-2 hrs/day, 5 days/week) and SLP (1-2 hrs/day, 5 days/week) to address physical and functional deficits in the context of the above medical diagnosis(es)? Yes Addressing deficits in the following areas: balance, endurance, locomotion, strength, transferring, bowel/bladder control, bathing, dressing, feeding, grooming, toileting, cognition, swallowing and psychosocial support 5. Can the patient actively participate in an intensive therapy program of at least 3 hrs of therapy per day at least 5 days per week? Yes 6. The potential for patient to make measurable gains while on inpatient rehab is excellent 7. Anticipated functional outcomes upon discharge from inpatient rehab are supervision to min assist with PT, supervision to min  assist with OT, supervision to mod I with SLP. 8. Estimated rehab length of stay to reach the above functional goals is: 14-20 days 9. Does the patient have adequate social supports to accommodate these discharge functional goals? Yes 10. Anticipated D/C setting: Home 11. Anticipated post D/C treatments: [facility] therapy 12. Overall Rehab/Functional Prognosis: excellent  RECOMMENDATIONS: This patient's condition is appropriate for continued rehabilitative care in the following setting: CIR Patient has agreed to participate in recommended program. Yes Note that insurance prior authorization may be required for reimbursement for  recommended care.  Comment: Rehab Admissions Coordinator to follow up.  Thanks,  Meredith Staggers, MD, Mellody Drown     08/22/2013

## 2013-08-22 NOTE — Progress Notes (Signed)
Physical Therapy Treatment Patient Details Name: [name] MRN: [id number] DOB: 1940/08/27 Today's Date: 08/22/2013 Time: [id number] PT Time Calculation (min): 23 min  PT Assessment / Plan / Recommendation  History of Present Illness  73 y.o. female admitted with questionable CVA secondary to HTN. Ct negative pending MRI. pt currently confused adn weak. pt dialysis patient on MWF   PT Comments   Pt much improved mobility today and is able to ambulate with one person A.  Pt continues to have confusion, but very pleasant and motivated to ambulate.    Follow Up Recommendations  CIR     Does the patient have the potential to tolerate intense rehabilitation     Barriers to Discharge        Equipment Recommendations   (TBD)    Recommendations for Other Services    Frequency Min 4X/week   Progress towards PT Goals Progress towards PT goals: Progressing toward goals  Plan Current plan remains appropriate    Precautions / Restrictions Precautions Precautions: Fall Precaution Comments: diaylsis port Rt chest, IV Rt forearm, restricted Lt UE Restrictions Weight Bearing Restrictions: No   Pertinent Vitals/Pain Denied pain.      Mobility  Bed Mobility Overal bed mobility: Needs Assistance Bed Mobility: Supine to Sit Supine to sit: Min assist General bed mobility comments: pt needed A for initiation, then able to help complete bed mobility.   Transfers Overall transfer level: Needs assistance Equipment used: Rolling walker (2 wheeled) Transfers: Sit to/from Stand Sit to Stand: Min assist General transfer comment: cues for UE use.   Ambulation/Gait Ambulation/Gait assistance: Min assist Ambulation Distance (Feet): 140 Feet Assistive device: Rolling walker (2 wheeled) Gait Pattern/deviations: Step-through pattern;Decreased stride length Gait velocity interpretation: Below normal speed for age/gender General Gait Details: pt much improved mobility and eager for ambulation.    Modified Rankin (Stroke Patients Only) Pre-Morbid Rankin Score: Moderate disability Modified Rankin: Moderately severe disability    Exercises     PT Diagnosis:    PT Problem List:   PT Treatment Interventions:     PT Goals (current goals can now be found in the care plan section) Acute Rehab PT Goals Patient Stated Goal: none stated by patient Time For Goal Achievement: 08/27/13 Potential to Achieve Goals: Fair  Visit Information  Last PT Received On: 08/22/13 Assistance Needed: +1 History of Present Illness:  73 y.o. female admitted with questionable CVA secondary to HTN. Ct negative pending MRI. pt currently confused adn weak. pt dialysis patient on MWF    Subjective Data  Patient Stated Goal: none stated by patient   Cognition  Cognition Arousal/Alertness: Awake/alert Behavior During Therapy: Flat affect Overall Cognitive Status: Impaired/Different from baseline Area of Impairment: Orientation;Attention;Memory;Following commands;Safety/judgement;Awareness;Problem solving Orientation Level: Disoriented to;Person;Place;Time;Situation Current Attention Level: Focused Memory: Decreased recall of precautions;Decreased short-term memory Following Commands: Follows one step commands inconsistently Safety/Judgement: Decreased awareness of safety;Decreased awareness of deficits Awareness: Emergent;Anticipatory;Intellectual Problem Solving: Slow processing;Decreased initiation;Difficulty sequencing;Requires verbal cues;Requires tactile cues    Balance  Balance Overall balance assessment: Needs assistance Standing balance support: Bilateral upper extremity supported Standing balance-Leahy Scale: Poor  End of Session PT - End of Session Equipment Utilized During Treatment: Gait belt Activity Tolerance: Patient tolerated treatment well Patient left: in chair;with call bell/phone within reach;with chair alarm set;with family/visitor present Nurse Communication: Mobility status    GP     [name], [place] 08/22/2013, 12:18 PM

## 2013-08-22 NOTE — Progress Notes (Signed)
UR complete.  Leiby Pigeon RN, MSN 

## 2013-08-22 NOTE — Progress Notes (Signed)
S: Back to baseline mentally O:BP 159/72  Pulse 99  Temp(Src) 98.6 F (37 C) (Oral)  Resp 18  Ht 5\' 6"  (1.676 m)  Wt 74.8 kg (164 lb 14.5 oz)  BMI 26.63 kg/m2  SpO2 99%  Intake/Output Summary (Last 24 hours) at 08/22/13 1213 Last data filed at 08/22/13 0900  Gross per 24 hour  Intake    360 ml  Output      0 ml  Net    360 ml   Weight change:  [id number] and alert CVS:RRR Resp:clear Abd:+ BS NTND Ext:no edema NEURO:CNI Ox3 no asterixis.  Using walker for ambulation which is new Rt permcath   . calcium carbonate  1 tablet Oral TID WC  . clopidogrel  75 mg Oral Q breakfast  . [START ON 08/23/2013] darbepoetin (ARANESP) injection - DIALYSIS  60 mcg Intravenous Q Tue-HD  . [START ON 08/23/2013] doxercalciferol  1 mcg Intravenous Q T,Th,Sa-HD  . erythromycin  250 mg Oral TID WC & HS  . [START ON 08/23/2013] ferric gluconate (FERRLECIT/NULECIT) IV  62.5 mg Intravenous Q Tue-HD  . heparin  5,000 Units Subcutaneous 3 times per day  . insulin aspart  0-9 Units Subcutaneous TID WC  . insulin NPH Human  5 Units Subcutaneous BID AC & HS  . multivitamin  1 tablet Oral QHS  . pantoprazole sodium  40 mg Oral Daily  . sodium chloride  3 mL Intravenous Q12H   Mr Brain Wo Contrast  08/20/2013   CLINICAL DATA:  Right lower extremity weakness.  EXAM: MRI HEAD WITHOUT CONTRAST  TECHNIQUE: Multiplanar, multiecho pulse sequences of the brain and surrounding structures were obtained without intravenous contrast.  COMPARISON:  Head CT 08/19/2013 and MRI 07/19/2013  FINDINGS: There are numerous small foci of acute infarction involving both cortex as well as subcortical and deep white matter in the bilateral frontal and parietal lobes as well as in both cerebellar hemispheres. Greatest involvement is in the white matter of the centrum semiovale bilaterally. There is mild associated edema without mass effect. There is no evidence of intracranial hemorrhage, mass, midline shift, or extra-axial fluid  collection. Mild to moderate cerebral atrophy is unchanged. Moderate white matter chronic small vessel ischemic change is noted.  Prior bilateral cataract surgery is noted. 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 There is mild bilateral sphenoid sinus mucosal thickening. Mastoid air cells are clear. Major intracranial vascular flow voids are preserved.  IMPRESSION: Scattered acute infarcts throughout both cerebral and cerebellar hemispheres. Findings could reflect watershed ischemia.   Electronically Signed   By: Logan Bores   On: 08/20/2013 13:45   BMET    Component Value Date/Time   NA 143 08/20/2013 0057   K 3.8 08/20/2013 0057   CL 104 08/20/2013 0057   CO2 24 08/20/2013 0057   GLUCOSE 136* 08/20/2013 0057   BUN 32* 08/20/2013 0057   CREATININE 5.65* 08/20/2013 0057   CALCIUM 8.6 08/20/2013 0057   GFRNONAA 7* 08/20/2013 0057   GFRAA 8* 08/20/2013 0057   CBC    Component Value Date/Time   WBC 8.2 08/20/2013 0057   RBC 3.00* 08/20/2013 0057   HGB 8.5* 08/20/2013 0057   HCT 25.8* 08/20/2013 0057   PLT 206 08/20/2013 0057   MCV 86.0 08/20/2013 0057   MCH 28.3 08/20/2013 0057   MCHC 32.9 08/20/2013 0057   RDW 17.3* 08/20/2013 0057   LYMPHSABS 1.4 07/20/2013 0910   MONOABS 1.7* 07/20/2013 0910   EOSABS 0.0 07/20/2013 0910   BASOSABS 0.0  07/20/2013 0910     Assessment: 1. Acute stroke, ? watershed from hypotensio 2.  ESRD 3. 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 Anemia on aranesp 5. HTN 6. DM  Plan: 1. Will plan HD in AM and keep on TTS schedule in case she goes to inpt rehab.  She can then go back to MWF at [facility] 2. Check labs at HD in AM  Paco Cislo T

## 2013-08-22 NOTE — Progress Notes (Signed)
PROGRESS NOTE  [name] [id number] DOB: Jun 23, 1940 DOA: 08/19/2013 PCP: [name], MD  Assessment/Plan: right lower extremity weakness with generalized weakness  -watershed due to hypoperfusion of brain due to hypotension -MRI brain: Scattered acute infarcts throughout both cerebral and cerebellar hemispheres. Findings could reflect watershed ischemia - neurology  - cardiology--stated it was okay to perform MRI with loop recorder  -will not repeat stroke workup that pt just had done in first week of Feb 2015  -loop recorder has not captured any ectopy at her appt 2 weeks ago  -The patient had a TEE on 07/21/2013 which was negative for any emboli or PFO  -TSH ok -d/c ASA--->start plavix  -PT: CIR  Cough- -look for signs of aspiration -better with robitussin  Relative hypotension  -hold labetolol and minoxidil - will allow a high BP so during dialysis patient does not drop too low -labetolol prn for sbp >190  -blood cultures x 2 NGTD  ESRD  -renal for maintenance HD   Leg edema and pain  -venous duplex to rule out dvt   History of Acute nonhemorrhagic Stroke (feb 2015)   Acute encephalopathy  -Likely related to the patient's relative hypotension  -Check serum B12, TSH, ammonia, RBC folate - all WNL -EKG and troponin in pt with sob and diaphoresis negative -UA neg for pyuria   Diabetes mellitus type 2  -Patient is on NPH insulin, give half of the dose while she is in the hospital, we might need to adjust that in the morning.  -Insulin sliding scale  -Patient has history of gastroparesis  -Daughter states that patient was not able to tolerate Reglan due agitation and mental status changes   Gastroparesis  -Unable to tolerate metoclopramide as discussed above  -continue erythromycin   History of sundowning  -Family patient has history of severe confusion at night  History of grade 2 diastolic dysfunction  -With lower extremity edema, blood pressure is  controlled, patient for dialysis for volume control.      Code Status: full Family Communication: patient/daughter at bedside Disposition Plan: CIR???   Consultants:  Neuro  Renal  rehab  Procedures:    Antibiotics:    HPI/Subjective: Did well with PT No SOB  Objective: Filed Vitals:   08/22/13 1049  BP: 159/72  Pulse: 99  Temp: 98.6 F (37 C)  Resp: 18    Intake/Output Summary (Last 24 hours) at 08/22/13 1343 Last data filed at 08/22/13 0900  Gross per 24 hour  Intake    360 ml  Output      0 ml  Net    360 ml   Filed Weights   08/19/13 1742 08/20/13 1730 08/20/13 2130  Weight: 74.6 kg (164 lb 7.4 oz) 77.1 kg (169 lb 15.6 oz) 74.8 kg (164 lb 14.5 oz)    Exam:   General:  Pleasant/cooperative  Cardiovascular: rrr  Respiratory: clear anterior, no wheezing  Abdomen: +BS, soft  Musculoskeletal: weakness overall, no facial droop   Data Reviewed: Basic Metabolic Panel:  Recent Labs Lab 08/19/13 1210 08/20/13 0057  NA 142 143  K 4.3 3.8  CL 103 104  CO2 25 24  GLUCOSE 165* 136*  BUN 28* 32*  CREATININE 5.03* 5.65*  CALCIUM 8.9 8.6   Liver Function Tests: No results found for this basename: AST, ALT, ALKPHOS, BILITOT, PROT, ALBUMIN,  in the last 168 hours No results found for this basename: LIPASE, AMYLASE,  in the last 168 hours  Recent Labs Lab 08/19/13  1900  AMMONIA 28   CBC:  Recent Labs Lab 08/19/13 1210 08/20/13 0057  WBC 10.1 8.2  HGB 9.6* 8.5*  HCT 28.8* 25.8*  MCV 86.7 86.0  PLT 208 206   Cardiac Enzymes:  Recent Labs Lab 08/19/13 1900 08/20/13 0057 08/20/13 0850  TROPONINI <0.30 <0.30 <0.30   BNP (last 3 results) No results found for this basename: PROBNP,  in the last 8760 hours CBG:  Recent Labs Lab 08/21/13 1157 08/21/13 1612 08/21/13 2257 08/22/13 0646 08/22/13 1228  GLUCAP 105* 193* 221* 140* 250*    Recent Results (from the past 240 hour(s))  CULTURE, BLOOD (ROUTINE X 2)     Status:  None   Collection Time    08/19/13  6:45 PM      Result Value Ref Range Status   Specimen Description BLOOD RIGHT ARM   Final   Special Requests BOTTLES DRAWN AEROBIC ONLY 5CC   Final   Culture  Setup Time     Final   Value: 08/19/2013 22:13     Performed at [facility]   Culture     Final   Value:        BLOOD CULTURE RECEIVED NO GROWTH TO DATE CULTURE WILL BE HELD FOR 5 DAYS BEFORE ISSUING A FINAL NEGATIVE REPORT     Performed at [facility]   Report Status PENDING   Incomplete  CULTURE, BLOOD (ROUTINE X 2)     Status: None   Collection Time    08/19/13  7:00 PM      Result Value Ref Range Status   Specimen Description BLOOD RIGHT HAND   Final   Special Requests BOTTLES DRAWN AEROBIC ONLY 4CC   Final   Culture  Setup Time     Final   Value: 08/19/2013 22:14     Performed at [facility]   Culture     Final   Value:        BLOOD CULTURE RECEIVED NO GROWTH TO DATE CULTURE WILL BE HELD FOR 5 DAYS BEFORE ISSUING A FINAL NEGATIVE REPORT     Performed at [facility]   Report Status PENDING   Incomplete     Studies: No results found.  Scheduled Meds: . calcium carbonate  1 tablet Oral TID WC  . clopidogrel  75 mg Oral Q breakfast  . [START ON 08/23/2013] darbepoetin (ARANESP) injection - DIALYSIS  60 mcg Intravenous Q Tue-HD  . [START ON 08/23/2013] doxercalciferol  1 mcg Intravenous Q T,Th,Sa-HD  . erythromycin  250 mg Oral TID WC & HS  . [START ON 08/23/2013] ferric gluconate (FERRLECIT/NULECIT) IV  62.5 mg Intravenous Q Tue-HD  . heparin  5,000 Units Subcutaneous 3 times per day  . insulin aspart  0-9 Units Subcutaneous TID WC  . insulin NPH Human  5 Units Subcutaneous BID AC & HS  . multivitamin  1 tablet Oral QHS  . pantoprazole sodium  40 mg Oral Daily  . sodium chloride  3 mL Intravenous Q12H   Continuous Infusions:  Antibiotics Given (last 72 hours)   Date/Time Action Medication Dose   08/19/13 2158 Given   erythromycin (ERY-TAB) EC  tablet 250 mg 250 mg   08/20/13 1058 Given   erythromycin (ERY-TAB) EC tablet 250 mg 250 mg   08/20/13 1321 Given  [p7 was off unit]   erythromycin (ERY-TAB) EC tablet 250 mg 250 mg   08/22/13 0743 Given   erythromycin (ERY-TAB) EC tablet 250 mg 250  mg   08/22/13 1218 Given   erythromycin (ERY-TAB) EC tablet 250 mg 250 mg      Active Problems:   DIABETES MELLITUS, TYPE II   HYPERLIPIDEMIA   HYPERTENSION   End stage renal disease   Right hemiparesis    Time spent: 25 min    Sharni Negron  [facility] Pager [phone]. If 7PM-7AM, please contact night-coverage at [email or web address], password [facility] 08/22/2013, 1:43 PM  LOS: 3 days

## 2013-08-22 NOTE — Progress Notes (Signed)
I met with pt and her daughter, [name] at bedside to discuss inpt rehab admission for her recovery. Both are in agreement. I will begin insurance approval with [place] for hopeful admission tomorrow. [phone]

## 2013-08-23 ENCOUNTER — Inpatient Hospital Stay (HOSPITAL_COMMUNITY)
Admission: RE | Admit: 2013-08-23 | Discharge: 2013-08-30 | DRG: 945 | Disposition: A | Discharge status: Home Health | Payer: Medicare Other | Source: Intra-hospital | Attending: Physical Medicine & Rehabilitation | Admitting: Physical Medicine & Rehabilitation

## 2013-08-23 DIAGNOSIS — Z5189 Encounter for other specified aftercare: Principal | ICD-10-CM

## 2013-08-23 DIAGNOSIS — G8191 Hemiplegia, unspecified affecting right dominant side: Secondary | ICD-10-CM

## 2013-08-23 DIAGNOSIS — Z7982 Long term (current) use of aspirin: Secondary | ICD-10-CM | POA: Diagnosis not present

## 2013-08-23 DIAGNOSIS — R1310 Dysphagia, unspecified: Secondary | ICD-10-CM

## 2013-08-23 DIAGNOSIS — D649 Anemia, unspecified: Secondary | ICD-10-CM | POA: Diagnosis not present

## 2013-08-23 DIAGNOSIS — I6340 Cerebral infarction due to embolism of unspecified cerebral artery: Secondary | ICD-10-CM

## 2013-08-23 DIAGNOSIS — E1142 Type 2 diabetes mellitus with diabetic polyneuropathy: Secondary | ICD-10-CM | POA: Diagnosis not present

## 2013-08-23 DIAGNOSIS — I120 Hypertensive chronic kidney disease with stage 5 chronic kidney disease or end stage renal disease: Secondary | ICD-10-CM | POA: Diagnosis not present

## 2013-08-23 DIAGNOSIS — Z794 Long term (current) use of insulin: Secondary | ICD-10-CM

## 2013-08-23 DIAGNOSIS — E1149 Type 2 diabetes mellitus with other diabetic neurological complication: Secondary | ICD-10-CM | POA: Diagnosis not present

## 2013-08-23 DIAGNOSIS — Z992 Dependence on renal dialysis: Secondary | ICD-10-CM

## 2013-08-23 DIAGNOSIS — I498 Other specified cardiac arrhythmias: Secondary | ICD-10-CM

## 2013-08-23 DIAGNOSIS — Z79899 Other long term (current) drug therapy: Secondary | ICD-10-CM

## 2013-08-23 DIAGNOSIS — Z8673 Personal history of transient ischemic attack (TIA), and cerebral infarction without residual deficits: Secondary | ICD-10-CM | POA: Diagnosis not present

## 2013-08-23 DIAGNOSIS — I639 Cerebral infarction, unspecified: Secondary | ICD-10-CM | POA: Diagnosis present

## 2013-08-23 DIAGNOSIS — N2581 Secondary hyperparathyroidism of renal origin: Secondary | ICD-10-CM | POA: Diagnosis not present

## 2013-08-23 DIAGNOSIS — N186 End stage renal disease: Secondary | ICD-10-CM

## 2013-08-23 DIAGNOSIS — R609 Edema, unspecified: Secondary | ICD-10-CM

## 2013-08-23 LAB — CBC
HCT: 26.2 % — ABNORMAL LOW (ref 36.0–46.0)
HEMOGLOBIN: 8.8 g/dL — AB (ref 12.0–15.0)
MCH: 28.9 pg (ref 26.0–34.0)
MCHC: 33.6 g/dL (ref 30.0–36.0)
MCV: 85.9 fL (ref 78.0–100.0)
PLATELETS: 201 10*3/uL (ref 150–400)
RBC: 3.05 MIL/uL — AB (ref 3.87–5.11)
RDW: 16.6 % — ABNORMAL HIGH (ref 11.5–15.5)
WBC: 10.3 10*3/uL (ref 4.0–10.5)

## 2013-08-23 LAB — RENAL FUNCTION PANEL
Albumin: 2.8 g/dL — ABNORMAL LOW (ref 3.5–5.2)
BUN: 43 mg/dL — ABNORMAL HIGH (ref 6–23)
CALCIUM: 8.3 mg/dL — AB (ref 8.4–10.5)
CO2: 27 meq/L (ref 19–32)
Chloride: 99 mEq/L (ref 96–112)
Creatinine, Ser: 6.25 mg/dL — ABNORMAL HIGH (ref 0.50–1.10)
GFR calc Af Amer: 7 mL/min — ABNORMAL LOW (ref >90)
GFR calc non Af Amer: 6 mL/min — ABNORMAL LOW (ref >90)
GLUCOSE: 209 mg/dL — AB (ref 70–99)
Phosphorus: 3.4 mg/dL (ref 2.3–4.6)
Potassium: 5.1 mEq/L (ref 3.7–5.3)
Sodium: 139 mEq/L (ref 137–147)

## 2013-08-23 LAB — GLUCOSE, CAPILLARY
GLUCOSE-CAPILLARY: 134 mg/dL — AB (ref 70–99)
Glucose-Capillary: 139 mg/dL — ABNORMAL HIGH (ref 70–99)

## 2013-08-23 MED ORDER — DOXERCALCIFEROL 4 MCG/2ML IV SOLN: 1.0000 ug | INTRAVENOUS | Status: DC

## 2013-08-23 MED ORDER — PANTOPRAZOLE SODIUM 40 MG PO PACK
40.0000 mg | PACK | Freq: Every day | ORAL | Status: DC
  Administered 2013-08-24 – 2013-08-30 (×7): 40 mg via ORAL
  Filled 2013-08-23 (×11): qty 20

## 2013-08-23 MED ORDER — LIDOCAINE HCL (PF) 1 % IJ SOLN: 5.0000 mL | INTRAMUSCULAR | Status: DC | PRN

## 2013-08-23 MED ORDER — DARBEPOETIN ALFA-POLYSORBATE 60 MCG/0.3ML IJ SOLN: 60.0000 ug | INTRAMUSCULAR | Status: DC

## 2013-08-23 MED ORDER — CLOPIDOGREL BISULFATE 75 MG PO TABS
75.0000 mg | ORAL_TABLET | Freq: Every day | ORAL | Status: DC
  Administered 2013-08-24 – 2013-08-30 (×7): 75 mg via ORAL
  Filled 2013-08-23 (×8): qty 1

## 2013-08-23 MED ORDER — INSULIN NPH (HUMAN) (ISOPHANE) 100 UNIT/ML ~~LOC~~ SUSP
5.0000 [IU] | Freq: Two times a day (BID) | SUBCUTANEOUS | Status: DC
  Administered 2013-08-23 – 2013-08-30 (×14): 5 [IU] via SUBCUTANEOUS
  Filled 2013-08-23: qty 10

## 2013-08-23 MED ORDER — DOXERCALCIFEROL 4 MCG/2ML IV SOLN
1.0000 ug | INTRAVENOUS | Status: DC
Start: 2013-08-25 — End: 2013-08-30
  Administered 2013-08-25: 22:00:00 via INTRAVENOUS
  Administered 2013-08-27: 1 ug via INTRAVENOUS
  Filled 2013-08-23 (×3): qty 2

## 2013-08-23 MED ORDER — LABETALOL HCL 200 MG PO TABS
200.0000 mg | ORAL_TABLET | Freq: Two times a day (BID) | ORAL | Status: DC
  Administered 2013-08-23 – 2013-08-30 (×13): 200 mg via ORAL
  Filled 2013-08-23 (×16): qty 1

## 2013-08-23 MED ORDER — DOXERCALCIFEROL 4 MCG/2ML IV SOLN
INTRAVENOUS | Status: AC
  Administered 2013-08-23: 1 ug via INTRAVENOUS
  Filled 2013-08-23: qty 2

## 2013-08-23 MED ORDER — CALCIUM CARBONATE ANTACID 500 MG PO CHEW
1.0000 | CHEWABLE_TABLET | Freq: Three times a day (TID) | ORAL | Status: DC
  Administered 2013-08-24 – 2013-08-30 (×20): 200 mg via ORAL
  Filled 2013-08-23 (×22): qty 1

## 2013-08-23 MED ORDER — ERYTHROMYCIN ETHYLSUCCINATE 200 MG/5ML PO SUSR
200.0000 mg | Freq: Three times a day (TID) | ORAL | Status: DC
  Administered 2013-08-23 – 2013-08-26 (×11): 200 mg via ORAL
  Filled 2013-08-23 (×16): qty 5

## 2013-08-23 MED ORDER — DARBEPOETIN ALFA-POLYSORBATE 60 MCG/0.3ML IJ SOLN
60.0000 ug | INTRAMUSCULAR | Status: DC
  Filled 2013-08-23: qty 0.3

## 2013-08-23 MED ORDER — NEPRO/CARBSTEADY PO LIQD: 237.0000 mL | ORAL | Status: DC | PRN

## 2013-08-23 MED ORDER — ACETAMINOPHEN 650 MG RE SUPP: 650.0000 mg | Freq: Four times a day (QID) | RECTAL | Status: DC | PRN

## 2013-08-23 MED ORDER — INSULIN ASPART 100 UNIT/ML ~~LOC~~ SOLN
0.0000 [IU] | Freq: Three times a day (TID) | SUBCUTANEOUS | Status: DC
  Administered 2013-08-24: 5 [IU] via SUBCUTANEOUS
  Administered 2013-08-24 – 2013-08-25 (×3): 1 [IU] via SUBCUTANEOUS
  Administered 2013-08-25 – 2013-08-26 (×2): 3 [IU] via SUBCUTANEOUS
  Administered 2013-08-26: 1 [IU] via SUBCUTANEOUS
  Administered 2013-08-26: 5 [IU] via SUBCUTANEOUS
  Administered 2013-08-27: 1 [IU] via SUBCUTANEOUS
  Administered 2013-08-27: 3 [IU] via SUBCUTANEOUS
  Administered 2013-08-27: 1 [IU] via SUBCUTANEOUS
  Administered 2013-08-28: 7 [IU] via SUBCUTANEOUS
  Administered 2013-08-28 – 2013-08-29 (×3): 2 [IU] via SUBCUTANEOUS
  Administered 2013-08-29: 1 [IU] via SUBCUTANEOUS
  Administered 2013-08-29: 3 [IU] via SUBCUTANEOUS
  Administered 2013-08-30 (×2): 2 [IU] via SUBCUTANEOUS

## 2013-08-23 MED ORDER — HEPARIN SODIUM (PORCINE) 5000 UNIT/ML IJ SOLN
5000.0000 [IU] | Freq: Three times a day (TID) | INTRAMUSCULAR | Status: DC
  Administered 2013-08-23 – 2013-08-30 (×17): 5000 [IU] via SUBCUTANEOUS
  Filled 2013-08-23 (×24): qty 1

## 2013-08-23 MED ORDER — DARBEPOETIN ALFA-POLYSORBATE 60 MCG/0.3ML IJ SOLN
INTRAMUSCULAR | Status: AC
  Administered 2013-08-23: 60 ug via INTRAVENOUS
  Filled 2013-08-23: qty 0.3

## 2013-08-23 MED ORDER — HEPARIN SODIUM (PORCINE) 5000 UNIT/ML IJ SOLN: 5000.0000 [IU] | Freq: Three times a day (TID) | INTRAMUSCULAR | Status: DC

## 2013-08-23 MED ORDER — LIDOCAINE-PRILOCAINE 2.5-2.5 % EX CREA: 1.0000 "application " | TOPICAL_CREAM | CUTANEOUS | Status: DC | PRN

## 2013-08-23 MED ORDER — ACETAMINOPHEN 325 MG PO TABS
650.0000 mg | ORAL_TABLET | Freq: Four times a day (QID) | ORAL | Status: DC | PRN
  Filled 2013-08-23: qty 2

## 2013-08-23 MED ORDER — ERYTHROMYCIN ETHYLSUCCINATE 200 MG/5ML PO SUSR
200.0000 mg | Freq: Three times a day (TID) | ORAL | Status: DC
Start: 2013-08-23 — End: 2013-08-30

## 2013-08-23 MED ORDER — GUAIFENESIN 100 MG/5ML PO SOLN
5.0000 mL | ORAL | Status: DC | PRN
  Filled 2013-08-23: qty 5

## 2013-08-23 MED ORDER — HEPARIN SODIUM (PORCINE) 1000 UNIT/ML DIALYSIS: 1000.0000 [IU] | INTRAMUSCULAR | Status: DC | PRN

## 2013-08-23 MED ORDER — SORBITOL 70 % SOLN: 30.0000 mL | Freq: Every day | Status: DC | PRN

## 2013-08-23 MED ORDER — ERYTHROMYCIN ETHYLSUCCINATE 200 MG/5ML PO SUSR
200.0000 mg | Freq: Three times a day (TID) | ORAL | Status: DC
  Administered 2013-08-23 (×2): 200 mg via ORAL
  Filled 2013-08-23 (×5): qty 5

## 2013-08-23 MED ORDER — SODIUM CHLORIDE 0.9 % IV SOLN
62.5000 mg | INTRAVENOUS | Status: DC
Start: 2013-08-23 — End: 2013-08-30

## 2013-08-23 MED ORDER — PANTOPRAZOLE SODIUM 40 MG PO PACK
40.0000 mg | PACK | Freq: Every day | ORAL | Status: DC
Start: 2013-08-23 — End: 2013-08-30

## 2013-08-23 MED ORDER — ONDANSETRON HCL 4 MG/2ML IJ SOLN: 4.0000 mg | Freq: Four times a day (QID) | INTRAMUSCULAR | Status: DC | PRN

## 2013-08-23 MED ORDER — PENTAFLUOROPROP-TETRAFLUOROETH EX AERO: 1.0000 "application " | INHALATION_SPRAY | CUTANEOUS | Status: DC | PRN

## 2013-08-23 MED ORDER — GUAIFENESIN 100 MG/5ML PO SOLN: 5.0000 mL | ORAL | Status: DC | PRN

## 2013-08-23 MED ORDER — CLOPIDOGREL BISULFATE 75 MG PO TABS
75.0000 mg | ORAL_TABLET | Freq: Every day | ORAL | Status: DC
Start: 2013-08-23 — End: 2013-08-30

## 2013-08-23 MED ORDER — RESOURCE THICKENUP CLEAR PO POWD: ORAL | Status: DC | PRN

## 2013-08-23 MED ORDER — INSULIN NPH (HUMAN) (ISOPHANE) 100 UNIT/ML ~~LOC~~ SUSP: 5.0000 [IU] | Freq: Two times a day (BID) | SUBCUTANEOUS | Status: DC

## 2013-08-23 MED ORDER — INSULIN ASPART 100 UNIT/ML ~~LOC~~ SOLN: 0.0000 [IU] | Freq: Three times a day (TID) | SUBCUTANEOUS | Status: DC

## 2013-08-23 MED ORDER — LABETALOL HCL 200 MG PO TABS
200.0000 mg | ORAL_TABLET | Freq: Two times a day (BID) | ORAL | Status: DC
  Filled 2013-08-23 (×3): qty 1

## 2013-08-23 MED ORDER — SODIUM CHLORIDE 0.9 % IV SOLN
62.5000 mg | INTRAVENOUS | Status: DC
  Filled 2013-08-23: qty 5

## 2013-08-23 MED ORDER — RENA-VITE PO TABS
1.0000 | ORAL_TABLET | Freq: Every day | ORAL | Status: DC
  Administered 2013-08-23 – 2013-08-29 (×7): 1 via ORAL
  Filled 2013-08-23 (×8): qty 1

## 2013-08-23 MED ORDER — ONDANSETRON HCL 4 MG PO TABS: 4.0000 mg | ORAL_TABLET | Freq: Four times a day (QID) | ORAL | Status: DC | PRN

## 2013-08-23 MED ORDER — CLONIDINE HCL 0.1 MG PO TABS
0.1000 mg | ORAL_TABLET | ORAL | Status: DC | PRN
  Filled 2013-08-23: qty 1

## 2013-08-23 NOTE — Progress Notes (Signed)
Pt left unit for dialysis CMT notified

## 2013-08-23 NOTE — Progress Notes (Signed)
PT Cancellation Note  Patient Details Name: [name] MRN: [id number] DOB: 09/09/1940   Cancelled Treatment:    Reason Eval/Treat Not Completed: Patient at procedure or test/unavailable.  Pt now leaving for HD.  Will f/u another time.     Williard Keller, Thornton Papas 08/23/2013, 10:57 AM

## 2013-08-23 NOTE — Discharge Summary (Signed)
Physician Discharge Summary  [name] [id number] DOB: 01/15/1941 DOA: 08/19/2013  PCP: [name], MD  Admit date: 08/19/2013 Discharge date: 08/23/2013  Time spent: 35 minutes  Recommendations for Outpatient Follow-up:  1. To CIR 2. Monitor blood sugars 3. HD per nephro  Discharge Diagnoses:  Active Problems:   DIABETES MELLITUS, TYPE II   HYPERLIPIDEMIA   HYPERTENSION   End stage renal disease   Right hemiparesis   Discharge Condition: improved  Diet recommendation: DYS 2 nectar thick  Filed Weights   08/19/13 1742 08/20/13 1730 08/20/13 2130  Weight: 74.6 kg (164 lb 7.4 oz) 77.1 kg (169 lb 15.6 oz) 74.8 kg (164 lb 14.5 oz)    History of present illness:  73 year old female with a history of hypertension, diabetes mellitus, ESRD, on dialysis, recent stroke presents with generalized weakness as well as right lower extremity weakness when she woke up this morning. Normally the patient is able to perform all her activities of daily living. However this morning she was too weak to get up out of bed. The patient had a full dialysis session on 08/17/2013., But she missed her dialysis today as a result coming to the emergency department. The patient's family is at the bedside to supplement history. The patient is a poor historian. The patient's family has been told that over the last 2-3 dialysis sessions, the patient's systolic blood pressure has been elevated up to 200 range. He took the patient to see her primary care physician on 08/18/2013. The patient was restarted on minoxidil in addition to her labetalol. Upon arrival to the ED, the patient's pressure was 111/49. This morning, the patient needed significant assistance to get out of bed from her family. She was noted to be diaphoretic, but she denies any chest discomfort. She complains of some shortness breath with exertion. There has been a nonproductive cough for the past week. The patient denies any fevers, chills.  She had one episode of nausea and vomiting prior to arrival without any hematemesis. She does have some dyspnea on exertion without any PND orthopnea. Patient also had a headache this morning which has resolved. In addition, she feels that her vision is worse than usual.  In the ED, the patient was noted to have blood pressure 111/49. CBC and BMP are essentially unremarkable except for serum creatinine of 5.03 and this ESRD patient. Urinalysis did not show any pyuria. CT brain was negative for any acute findings. Chest x-ray was positive for small bilateral pleural effusions and vascular congestion.    Hospital Course:  right lower extremity weakness with generalized weakness  -watershed due to hypoperfusion of brain due to hypotension  -MRI brain: Scattered acute infarcts throughout both cerebral and cerebellar hemispheres. Findings could reflect watershed ischemia  - neurology  - cardiology--stated it was okay to perform MRI with loop recorder  -will not repeat stroke workup that pt just had done in first week of Feb 2015  -loop recorder has not captured any ectopy at her appt 2 weeks ago  -The patient had a TEE on 07/21/2013 which was negative for any emboli or PFO  -TSH ok  -d/c ASA--->start plavix  -PT: CIR   Cough-  -watch for aspiration  -better with robitussin   Relative hypotension  -hold labetolol and minoxidil - will allow a high BP so during dialysis patient does not drop too low  -labetolol prn for sbp >190- can also use PO hydralazine  -blood cultures x 2 NGTD   ESRD  -  renal for maintenance HD   Leg edema and pain  -venous duplex to rule out dvt - ordered but never done -resolved  History of Acute nonhemorrhagic Stroke (feb 2015)   Acute encephalopathy  -Likely related to the patient's relative hypotension  -Check serum B12, TSH, ammonia, RBC folate - all WNL  -EKG and troponin in pt with sob and diaphoresis negative  -UA neg for pyuria   Diabetes mellitus type  2  -Patient is on NPH insulin, give half of the dose while she is in the hospital, we might need to adjust that in the morning.  -Insulin sliding scale  -Patient has history of gastroparesis  -Daughter states that patient was not able to tolerate Reglan due agitation and mental status changes   Gastroparesis  -Unable to tolerate metoclopramide as discussed above  -continue erythromycin   History of sundowning  -Family patient has history of severe confusion at night   History of grade 2 diastolic dysfunction  -With lower extremity edema, blood pressure is controlled, patient for dialysis for volume control.       Procedures:    Consultations:  HD  neuro  Discharge Exam: Filed Vitals:   08/23/13 0539  BP: 161/82  Pulse: 96  Temp: 98.7 F (37.1 C)  Resp: 20    General: pleasant/cooperative Cardiovascular: rrr Respiratory: clear anterior  Discharge Instructions  Discharge Orders   Future Appointments Provider Department Dept Phone   08/30/2013 10:00 AM [facility] [phone]   08/30/2013 10:30 AM [facility] [address] [phone]   08/30/2013 11:00 AM Rosetta Posner, MD [facility] [phone]   Future Orders Complete By Expires   Discharge instructions  As directed    Comments:     Allowing for a high BP to avoid the lows during dialysis- can use a PRN for SBP> 170 DYS diet HD per nepro   Increase activity slowly  As directed        Medication List    STOP taking these medications       aspirin 325 MG tablet     labetalol 200 MG tablet  Commonly known as:  NORMODYNE     pantoprazole 40 MG tablet  Commonly known as:  PROTONIX  Replaced by:  pantoprazole sodium 40 mg/20 mL Pack     sodium chloride 0.9 % SOLN 100 mL with ferric gluconate 12.5 MG/ML SOLN 125 mg  Replaced by:  sodium chloride 0.9 % SOLN 100 mL with ferric gluconate 12.5 MG/ML SOLN  62.5 mg     VITAMIN B-12 IJ      TAKE these medications       acetaminophen 325 MG tablet  Commonly known as:  TYLENOL  Take 650 mg by mouth every 6 (six) hours as needed (pain).     calcium carbonate 500 MG chewable tablet  Commonly known as:  TUMS - dosed in mg elemental calcium  Chew 1 tablet by mouth 3 (three) times daily with meals.     clopidogrel 75 MG tablet  Commonly known as:  PLAVIX  Take 1 tablet (75 mg total) by mouth daily with breakfast.     darbepoetin 60 MCG/0.3ML Soln injection  Commonly known as:  ARANESP  Inject 0.3 mLs (60 mcg total) into the vein every Tuesday with hemodialysis.     doxercalciferol 4 MCG/2ML injection  Commonly known as:  HECTOROL  Inject 0.5 mLs (1 mcg total) into the  vein Every Tuesday,Thursday,and Saturday with dialysis.     erythromycin ethylsuccinate 200 MG/5ML suspension  Commonly known as:  EES  Take 5 mLs (200 mg total) by mouth 4 (four) times daily -  with meals and at bedtime.     feeding supplement (NEPRO CARB STEADY) Liqd  Take 237 mLs by mouth as needed (missed meal during dialysis.).     guaiFENesin 100 MG/5ML Soln  Commonly known as:  ROBITUSSIN  Take 5 mLs (100 mg total) by mouth every 4 (four) hours as needed for cough or to loosen phlegm.     insulin aspart 100 UNIT/ML injection  Commonly known as:  novoLOG  Inject 0-9 Units into the skin 3 (three) times daily with meals.     insulin NPH Human 100 UNIT/ML injection  Commonly known as:  HUMULIN N,NOVOLIN N  Inject 5 Units into the skin 2 (two) times daily at 8 am and 10 pm.     lidocaine (PF) 1 % Soln injection  Commonly known as:  XYLOCAINE  Inject 5 mLs into the skin as needed (topical anesthesia for hemodialysis ifGEBAUERS is ineffective.).     lidocaine-prilocaine cream  Commonly known as:  EMLA  Apply 1 application topically as needed (topical anesthesia for hemodialysis if Gebauers and Lidocaine injection are ineffective.).     multivitamin Tabs  tablet  Take 1 tablet by mouth at bedtime.     pantoprazole sodium 40 mg/20 mL Pack  Commonly known as:  PROTONIX  Take 20 mLs (40 mg total) by mouth daily.     pentafluoroprop-tetrafluoroeth Aero  Commonly known as:  GEBAUERS  Apply 1 application topically as needed (topical anesthesia for hemodialysis).     sodium chloride 0.9 % SOLN 100 mL with ferric gluconate 12.5 MG/ML SOLN 62.5 mg  Inject 62.5 mg into the vein every Tuesday with hemodialysis.       Allergies  Allergen Reactions  . Ambien [Zolpidem] Other (See Comments)    Hallucinations   . Reglan [Metoclopramide] Other (See Comments)    Hallucinations       The results of significant diagnostics from this hospitalization (including imaging, microbiology, ancillary and laboratory) are listed below for reference.    Significant Diagnostic Studies: Dg Chest 2 View  08/19/2013   CLINICAL DATA:  Weakness, confusion.  EXAM: CHEST  2 VIEW  COMPARISON:  07/20/2013  FINDINGS: Mild cardiomegaly. Vascular congestion. Bibasilar airspace opacities likely reflect atelectasis. Suspect small layering effusions. Right dialysis catheter is in place with the tip in the right atrium, unchanged. No acute bony abnormality.  IMPRESSION: Cardiomegaly with vascular congestion.  Small bilateral effusions with bibasilar densities, likely atelectasis.   Electronically Signed   By: Rolm Baptise M.D.   On: 08/19/2013 13:01   Ct Head (brain) Wo Contrast  08/19/2013   CLINICAL DATA:  73 year old with weakness. Patient was unable to walk.  EXAM: CT HEAD WITHOUT CONTRAST  TECHNIQUE: Contiguous axial images were obtained from the base of the skull through the vertex without contrast.  COMPARISON:  06/25/2013  FINDINGS: Chronic calcifications in the basal ganglia. No evidence for acute hemorrhage, mass lesion, midline shift, hydrocephalus or large infarct. Chronic low-density in the periventricular and subcortical white matter, particularly in the parietal lobes.  Visualized sinuses are clear. No acute bone abnormality.  IMPRESSION: No acute intracranial abnormality.  White matter changes are suggestive for chronic small vessel ischemic disease.   Electronically Signed   By: Markus Daft M.D.   On: 08/19/2013 14:00  Mr Brain Wo Contrast  08/20/2013   CLINICAL DATA:  Right lower extremity weakness.  EXAM: MRI HEAD WITHOUT CONTRAST  TECHNIQUE: Multiplanar, multiecho pulse sequences of the brain and surrounding structures were obtained without intravenous contrast.  COMPARISON:  Head CT 08/19/2013 and MRI 07/19/2013  FINDINGS: There are numerous small foci of acute infarction involving both cortex as well as subcortical and deep white matter in the bilateral frontal and parietal lobes as well as in both cerebellar hemispheres. Greatest involvement is in the white matter of the centrum semiovale bilaterally. There is mild associated edema without mass effect. There is no evidence of intracranial hemorrhage, mass, midline shift, or extra-axial fluid collection. Mild to moderate cerebral atrophy is unchanged. Moderate white matter chronic small vessel ischemic change is noted.  Prior bilateral cataract surgery is noted. 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 There is mild bilateral sphenoid sinus mucosal thickening. Mastoid air cells are clear. Major intracranial vascular flow voids are preserved.  IMPRESSION: Scattered acute infarcts throughout both cerebral and cerebellar hemispheres. Findings could reflect watershed ischemia.   Electronically Signed   By: Logan Bores   On: 08/20/2013 13:45    Microbiology: Recent Results (from the past 240 hour(s))  CULTURE, BLOOD (ROUTINE X 2)     Status: None   Collection Time    08/19/13  6:45 PM      Result Value Ref Range Status   Specimen Description BLOOD RIGHT ARM   Final   Special Requests BOTTLES DRAWN AEROBIC ONLY 5CC   Final   Culture  Setup Time     Final   Value: 08/19/2013 22:13     Performed at [facility]    Culture     Final   Value:        BLOOD CULTURE RECEIVED NO GROWTH TO DATE CULTURE WILL BE HELD FOR 5 DAYS BEFORE ISSUING A FINAL NEGATIVE REPORT     Performed at [facility]   Report Status PENDING   Incomplete  CULTURE, BLOOD (ROUTINE X 2)     Status: None   Collection Time    08/19/13  7:00 PM      Result Value Ref Range Status   Specimen Description BLOOD RIGHT HAND   Final   Special Requests BOTTLES DRAWN AEROBIC ONLY 4CC   Final   Culture  Setup Time     Final   Value: 08/19/2013 22:14     Performed at [facility]   Culture     Final   Value:        BLOOD CULTURE RECEIVED NO GROWTH TO DATE CULTURE WILL BE HELD FOR 5 DAYS BEFORE ISSUING A FINAL NEGATIVE REPORT     Performed at [facility]   Report Status PENDING   Incomplete     Labs: Basic Metabolic Panel:  Recent Labs Lab 08/19/13 1210 08/20/13 0057  NA 142 143  K 4.3 3.8  CL 103 104  CO2 25 24  GLUCOSE 165* 136*  BUN 28* 32*  CREATININE 5.03* 5.65*  CALCIUM 8.9 8.6   Liver Function Tests: No results found for this basename: AST, ALT, ALKPHOS, BILITOT, PROT, ALBUMIN,  in the last 168 hours No results found for this basename: LIPASE, AMYLASE,  in the last 168 hours  Recent Labs Lab 08/19/13 1900  AMMONIA 28   CBC:  Recent Labs Lab 08/19/13 1210 08/20/13 0057  WBC 10.1 8.2  HGB 9.6* 8.5*  HCT 28.8* 25.8*  MCV 86.7 86.0  PLT 208 206   Cardiac Enzymes:  Recent Labs Lab 08/19/13 1900 08/20/13 0057 08/20/13 0850  TROPONINI <0.30 <0.30 <0.30   BNP: BNP (last 3 results) No results found for this basename: PROBNP,  in the last 8760 hours CBG:  Recent Labs Lab 08/22/13 0646 08/22/13 1228 08/22/13 1655 08/22/13 2316 08/23/13 0707  GLUCAP 140* 250* 141* 226* 134*       Signed:  Charlett Merkle  [facility] 08/23/2013, 9:22 AM

## 2013-08-23 NOTE — Progress Notes (Signed)
SLP Cancellation Note  Patient Details Name: LUCHANA TUDOR MRN: [id number] DOB: [birth year]   Cancelled treatment:       Reason Eval/Treat Not Completed: Patient at procedure or test/unavailable   Micaela Stith,PAT, M.S., CCC-SLP 08/23/2013, 11:47 AM

## 2013-08-23 NOTE — Progress Notes (Signed)
S: Feels well.  To transfer to CIR O:BP 187/95  Pulse 103  Temp(Src) 98 F (36.7 C) (Oral)  Resp 20  Ht 5\' 6"  (1.676 m)  Wt 74.8 kg (164 lb 14.5 oz)  BMI 26.63 kg/m2  SpO2 95%  Intake/Output Summary (Last 24 hours) at 08/23/13 1003 Last data filed at 08/23/13 0900  Gross per 24 hour  Intake    480 ml  Output      0 ml  Net    480 ml   Weight change:  IN:[id number] and alert CVS:RRR Resp:clear Abd:+ BS NTND Ext:no edema NEURO:CNI Ox3 no asterixis.  Using walker for ambulation which is new Rt permcath   . calcium carbonate  1 tablet Oral TID WC  . clopidogrel  75 mg Oral Q breakfast  . darbepoetin (ARANESP) injection - DIALYSIS  60 mcg Intravenous Q Tue-HD  . doxercalciferol  1 mcg Intravenous Q Howard,Th,Sa-HD  . erythromycin ethylsuccinate  200 mg Oral TID WC & HS  . ferric gluconate (FERRLECIT/NULECIT) IV  62.5 mg Intravenous Q Tue-HD  . heparin  5,000 Units Subcutaneous 3 times per day  . insulin aspart  0-9 Units Subcutaneous TID WC  . insulin NPH Human  5 Units Subcutaneous BID AC & HS  . multivitamin  1 tablet Oral QHS  . pantoprazole sodium  40 mg Oral Daily  . sodium chloride  3 mL Intravenous Q12H   No results found. BMET    Component Value Date/Time   NA 143 08/20/2013 0057   K 3.8 08/20/2013 0057   CL 104 08/20/2013 0057   CO2 24 08/20/2013 0057   GLUCOSE 136* 08/20/2013 0057   BUN 32* 08/20/2013 0057   CREATININE 5.65* 08/20/2013 0057   CALCIUM 8.6 08/20/2013 0057   GFRNONAA 7* 08/20/2013 0057   GFRAA 8* 08/20/2013 0057   CBC    Component Value Date/Time   WBC 8.2 08/20/2013 0057   RBC 3.00* 08/20/2013 0057   HGB 8.5* 08/20/2013 0057   HCT 25.8* 08/20/2013 0057   PLT 206 08/20/2013 0057   MCV 86.0 08/20/2013 0057   MCH 28.3 08/20/2013 0057   MCHC 32.9 08/20/2013 0057   RDW 17.3* 08/20/2013 0057   LYMPHSABS 1.4 07/20/2013 0910   MONOABS 1.7* 07/20/2013 0910   EOSABS 0.0 07/20/2013 0910   BASOSABS 0.0 07/20/2013 0910     Assessment: 1. Acute stroke, ? watershed from hypotension 2.   ESRD 3. 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 Anemia on aranesp 5. HTN 6. DM  Plan: 1.HD today and will keep on TTS.(oupt MWF) 2.  I think she is going to need some antiHTN.  Will resume labetalol and use clonidine PRN  [name]

## 2013-08-23 NOTE — PMR Pre-admission (Signed)
PMR Admission Coordinator Pre-Admission Assessment  Patient: [name] is an 73 y.o., female MRN: [id number] DOB: 09/01/1940 Height: 5\' 6"  (167.6 cm) Weight: 76.5 kg (168 lb 10.4 oz)              Insurance Information HMO: yes    PPO:      PCP:      IPA:      80/20:      OTHER:  PRIMARY: [employer] medicare      Policy#: [id number]      Subscriber: pt CM Name: Apolonio Schneiders      Phone#: [phone]     Fax#: [id number] Pre-Cert#: [id number]      Employer: retired Benefits:  Phone #: [phone]     Name: 08/23/13 Eff. Date: 06/16/13     Deduct: non      Out of Pocket Max: $4900      Life Max: none CIR: $345 per day days 1-5      SNF: no copay days 1-20; $155 per day days 21-52; no copay days 53-100 Outpatient: $40 copay per visit     Co-Pay: no visit limit Home Health: 100%      Co-Pay: none DME: 80%     Co-Pay: 20% Providers: in network  SECONDARY: none      Emergency Contact Information Contact Information   Name Relation Home Work Mobile   Red Bud Daughter [phone]     Racheal Patches   [phone]   Joylene Grapes Daughter   [phone]     Current Medical History  Patient Admitting Diagnosis:bicerebral infarcts  History of Present Illness: [name] is a 73 y.o. 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 Admitted 08/19/2013 with right side weakness as well as altered mental status. MRI of the brain showed scattered acute infarcts throughout both cerebral and cerebellar hemispheres. Followup neurology services aspirin discontinued placed on Plavix. A loop recorder in place from previous admission. No further stroke workup  indicated. Hemodialysis ongoing as per renal services. Subcutaneous heparin for DVT prophylaxis. Currently maintained on dysphagia 2 nectar thick liquid diet.   Total: 5 [facility]    Past Medical History  Past Medical History  Diagnosis Date  . Hypertension   . Cataract   . Diabetes mellitus   . Chronic kidney disease   . Shortness of breath   . Anemia   . CVA (cerebral infarction)     Family History  family history includes Cancer in her brother and sister. There is no history of Anesthesia problems, Hypotension, Malignant hyperthermia, or Pseudochol deficiency.  Prior Rehab/Hospitalizations: none  Current Medications  Current facility-administered medications:0.9 %  sodium chloride infusion, 100 mL, Intravenous, PRN, [name], MD;  0.9 %  sodium chloride infusion, 100 mL, Intravenous, PRN, [name], MD;  acetaminophen (TYLENOL) suppository 650 mg, 650 mg, Rectal, Q6H PRN, Orson Eva, MD;  acetaminophen (TYLENOL) tablet 650 mg, 650 mg, Oral, Q6H PRN, Orson Eva, MD calcium carbonate (TUMS - dosed in mg elemental calcium) chewable tablet 200 mg of elemental calcium, 1 tablet, Oral, TID WC, Orson Eva, MD, 200 mg of elemental calcium at 08/23/13 0842;  cloNIDine (CATAPRES) tablet 0.1 mg, 0.1 mg, Oral, Q4H PRN, Windy Kalata, MD;  clopidogrel (PLAVIX) tablet 75  mg, 75 mg, Oral, Q breakfast, Orson Eva, MD, 75 mg at 08/23/13 [id number] darbepoetin (ARANESP) injection 60 mcg, 60 mcg, Intravenous, Q Tue-HD, [name], MD;  doxercalciferol (HECTOROL) injection 1 mcg, 1 mcg, Intravenous, Q T,Th,Sa-HD, [name], MD;  erythromycin ethylsuccinate (EES) 200 MG/5ML suspension 200 mg, 200 mg, Oral, TID WC & HS, Arty Baumgartner, RPH, 200 mg at 08/23/13 [id number];  feeding supplement (NEPRO CARB STEADY) liquid 237 mL, 237 mL, Oral, PRN, [name], MD ferric gluconate (NULECIT) 62.5 mg in sodium chloride 0.9 % 100 mL IVPB, 62.5 mg, Intravenous, Q Tue-HD, [name], MD;  guaiFENesin  (ROBITUSSIN) 100 MG/5ML solution 100 mg, 5 mL, Oral, Q4H PRN, Geradine Girt, DO, 100 mg at 08/22/13 1734;  heparin injection 1,000 Units, 1,000 Units, Dialysis, PRN, [name], MD;  heparin injection 5,000 Units, 5,000 Units, Subcutaneous, 3 times per day, Orson Eva, MD, 5,000 Units at 08/23/13 0545 insulin aspart (novoLOG) injection 0-9 Units, 0-9 Units, Subcutaneous, TID WC, Orson Eva, MD, 1 Units at 08/23/13 [phone];  insulin NPH Human (HUMULIN N,NOVOLIN N) injection 5 Units, 5 Units, Subcutaneous, BID AC & HS, Orson Eva, MD, 5 Units at 08/23/13 [id number];  labetalol (NORMODYNE) tablet 200 mg, 200 mg, Oral, BID, Windy Kalata, MD labetalol (NORMODYNE,TRANDATE) injection 5 mg, 5 mg, Intravenous, Q2H PRN, Geradine Girt, DO, 5 mg at 08/23/13 0307;  lidocaine (PF) (XYLOCAINE) 1 % injection 5 mL, 5 mL, Intradermal, PRN, [name], MD;  lidocaine-prilocaine (EMLA) cream 1 application, 1 application, Topical, PRN, [name], MD;  multivitamin (RENA-VIT) tablet 1 tablet, 1 tablet, Oral, QHS, Orson Eva, MD, 1 tablet at 08/22/13 2320 ondansetron [facility]) injection 4 mg, 4 mg, Intravenous, Q6H PRN, Orson Eva, MD, 4 mg at 08/19/13 2315;  ondansetron (ZOFRAN) tablet 4 mg, 4 mg, Oral, Q6H PRN, Orson Eva, MD;  pantoprazole sodium (PROTONIX) 40 mg/20 mL oral suspension 40 mg, 40 mg, Oral, Daily, Jessica U Vann, DO, 40 mg at 08/22/13 1057;  pentafluoroprop-tetrafluoroeth (GEBAUERS) aerosol 1 application, 1 application, Topical, PRN, [name], MD RESOURCE THICKENUP CLEAR, , Oral, PRN, Geradine Girt, DO;  sodium chloride 0.9 % injection 3 mL, 3 mL, Intravenous, Q12H, Orson Eva, MD, 3 mL at 08/22/13 2334  Patients Current Diet: Renal Dysphagia 2 nectar thick  Precautions / Restrictions Precautions Precautions: Fall Precaution Comments: diaylsis port Rt chest, IV Rt forearm, restricted Lt UE Restrictions Weight Bearing Restrictions: No   Prior Activity Level Limited Community (1-2x/wk): pt new to  dialysis for 2 months. Mod I on walker pta did own [place] Devices/Equipment: Gilford Rile (specify type) Home Equipment: Walker - 4 wheels;Bedside commode;Tub bench  Prior Functional Level Prior Function Level of Independence: Needs assistance ADL's / Homemaking Assistance Needed: until recently pt was doing all ADLS alone. family has assisted recently.  Current Functional Level Cognition  Overall Cognitive Status: Impaired/Different from baseline Current Attention Level: Sustained Orientation Level: Oriented to person;Disoriented to time;Oriented to place;Oriented to situation Following Commands: Follows one step commands inconsistently Safety/Judgement: Decreased awareness of safety General Comments: Pt unable to verbalize who visitors in the room are and calls them by incorrect names. x2 daughters present. pt unable to give location ( names [place] and shocked when educated Gerald) Pt unable to give full DOB. Pt reports with very delayed response [birth year]--. Pt unable to provided year for DOB with cueing provided. Pt able to recall information stated after 15  seconds, 30 seconds but delayed initiation to respond to command. Pt repeating back to therapist commands with no action to command.     Extremity Assessment (includes Sensation/Coordination)          ADLs       Mobility  Overal bed mobility: Needs Assistance Bed Mobility: Supine to Sit;Sit to Supine Supine to sit: Supervision Sit to supine: Supervision General bed mobility comments: Supervision for safety.    Transfers  Overall transfer level: Needs assistance Equipment used: Rolling walker (2 wheeled) Transfers: Sit to/from Stand Sit to Stand: Min guard Stand pivot transfers: Max assist;+2 physical assistance General transfer comment: cues for hand placement.    Ambulation / Gait / Stairs / Wheelchair Mobility  Ambulation/Gait Ambulation/Gait assistance: Regulatory affairs officer (Feet): 140 Feet Assistive device: Rolling walker (2 wheeled) Gait Pattern/deviations: Step-through pattern;Decreased stride length Gait velocity interpretation: Below normal speed for age/gender General Gait Details: pt much improved mobility and eager for ambulation.      Posture / Balance Dynamic Sitting Balance Sitting balance - Comments: Pt sat EOB x 10 min with min guard to total assist as pt at times with posterior lean.  Pt with kyphotic posture and flexed trunk.  Pt demonstrates some postural control but had difficulty maintaining without constant cuing.      Special needs/care consideration Dialysis t, th, sat while hospitalized Bowel mgmt:continent Bladder mgmt:continent but very little    Previous Home Environment Living Arrangements: Children;Other (Comment) (2 sons live with pt. they work third shift)  Lives With: Family Available Help at Discharge: Available 24 hours/day;Family;Other (Comment) [name] and [place] provide 24/7 assist) Type of Home: House Home Layout: One level Home Access: Stairs to enter Entrance Stairs-Rails: None Entrance Stairs-Number of Steps: 1 Bathroom Shower/Tub: Chiropodist: Standard Bathroom Accessibility: Yes How Accessible: Accessible via walker Home Care Services: No Additional Comments: daughters come in to assist when brothers work and to dialysis, housework, etc  Discharge Living Setting Plans for Discharge Living Setting: Patient's home;Other (Comment) (2 sons live with pt) Type of Home at Discharge: House Discharge Home Layout: One level Discharge Home Access: Stairs to enter Entrance Stairs-Rails: None Entrance Stairs-Number of Steps: 1 Discharge Bathroom Shower/Tub: Tub/shower unit Discharge Bathroom Toilet: Standard Discharge Bathroom Accessibility: Yes How Accessible: Accessible via walker Does the patient have any problems obtaining your medications?:  No  Social/Family/Support Systems Patient Roles: Parent Contact Information: Juanetta Gosling, 2 daughters Anticipated Caregiver: daughters Anticipated Ambulance person Information: see above Ability/Limitations of Caregiver: min assist level Caregiver Availability: 24/7 Discharge Plan Discussed with Primary Caregiver: Yes Is Caregiver In Agreement with Plan?: No Does Caregiver/Family have Issues with Lodging/Transportation while Pt is in Rehab?: No (daughters stay with pt inhospital 24/7)    Goals/Additional Needs Patient/Family Goal for Rehab: supervision to min assist with PT and OT, supervision with SLP Expected length of stay: ELOS 14 to 20 days Dietary Needs: renal diet Special Service Needs: new to dialysis in past two months. daughters provide transportation. to be on t, th, sat schedule Pt/Family Agrees to Admission and willing to participate: Yes Program Orientation Provided & Reviewed with Pt/Caregiver Including Roles  & Responsibilities: Yes   Decrease burden of Care through IP rehab admission: n/a   Possible need for SNF placement upon discharge:no   Patient Condition: This patient's condition remains as documented in the consult dated 08/22/13, in which the Rehabilitation Physician determined and documented that the patient's condition is appropriate for intensive rehabilitative care in an inpatient  rehabilitation facility. Will admit to inpatient rehab today.  Preadmission Screen Completed By:  Cleatrice Burke, 08/23/2013 12:36 PM ______________________________________________________________________   Discussed status with Dr. Naaman Plummer on 08/23/13 at 1244 and received telephone approval for admission today.  Admission Coordinator:  Cleatrice Burke, time [id number] Date 08/23/13.

## 2013-08-23 NOTE — Progress Notes (Signed)
I have insurance approval and will arrange to admit pt today. They are aware and in agreement. I will notify Dr. Eliseo Squires. [id number]

## 2013-08-23 NOTE — Progress Notes (Signed)
Pt returned to unit from Dialysis.

## 2013-08-23 NOTE — Progress Notes (Signed)
Pt transferred to Inpatient rehab room 4111  Report called to ED the receiving RN. Condition at discharge was stable.

## 2013-08-23 NOTE — Progress Notes (Signed)
Occupational Therapy Treatment Patient Details Name: [name] MRN: [id number] DOB: 1940/12/13 Today's Date: 08/23/2013 Time: [id number] OT Time Calculation (min): 16 min  OT Assessment / Plan / Recommendation  History of present illness  73 y.o. female admitted with questionable CVA secondary to HTN. Ct negative pending MRI. pt currently confused adn weak. pt dialysis patient on MWF   OT comments  Pt progressing. Planning to go to CIR for additional rehab prior to d/c.   Follow Up Recommendations  CIR    Barriers to Discharge       Equipment Recommendations  3 in 1 bedside comode;Wheelchair (measurements OT);Wheelchair cushion (measurements OT);Hospital bed    Recommendations for Other Services Rehab consult  Frequency Min 3X/week   Progress towards OT Goals Progress towards OT goals: Progressing toward goals  Plan Discharge plan remains appropriate    Precautions / Restrictions Precautions Precautions: Fall Precaution Comments: diaylsis port Rt chest, IV Rt forearm, restricted Lt UE Restrictions Weight Bearing Restrictions: No   Pertinent Vitals/Pain No pain reported.     ADL  Grooming: Teeth care;Brushing hair;Min guard Where Assessed - Grooming: Supported standing Lower Body Dressing: Supervision/safety (socks) Where Assessed - Lower Body Dressing: Supported sitting Toilet Transfer: Min Psychiatric nurse Method: Sit to Loss adjuster, chartered: Comfort height toilet;Grab bars Toileting - Water quality scientist and Hygiene: Min guard Where Assessed - Best boy and Hygiene: Standing Equipment Used: Gait belt;Rolling walker Transfers/Ambulation Related to ADLs: Min A/Min guard for ambulation. Min guard for transfers. ADL Comments: Pt running walker into objects on right side-checked vision and no apparent field cut. Pt performed grooming at sink and pt with apparent slow processing/problem solving. Pt requiring cues to complete  task OT asked her to do. Pt at one point walking with walker with right arm in air and not holding onto walker.    OT Diagnosis:    OT Problem List:   OT Treatment Interventions:     OT Goals(current goals can now be found in the care plan section) Acute Rehab OT Goals Patient Stated Goal: none stated by patient OT Goal Formulation: With patient/family Time For Goal Achievement: 09/03/13 Potential to Achieve Goals: Good  Visit Information  Last OT Received On: 08/23/13 Assistance Needed: +1 History of Present Illness:  73 y.o. female admitted with questionable CVA secondary to HTN. Ct negative pending MRI. pt currently confused adn weak. pt dialysis patient on MWF    Subjective Data      Prior Functioning       Cognition  Cognition Arousal/Alertness: Awake/alert Behavior During Therapy: WFL for tasks assessed/performed Overall Cognitive Status: Impaired/Different from baseline Area of Impairment: Orientation;Memory;Attention;Safety/judgement Orientation Level: Disoriented to;Time;Place Current Attention Level: Sustained Memory: Decreased short-term memory Safety/Judgement: Decreased awareness of safety    Mobility  Bed Mobility Overal bed mobility: Needs Assistance Bed Mobility: Supine to Sit;Sit to Supine Supine to sit: Supervision Sit to supine: Supervision General bed mobility comments: Supervision for safety. Transfers Overall transfer level: Needs assistance Equipment used: Rolling walker (2 wheeled) Transfers: Sit to/from Stand Sit to Stand: Min guard General transfer comment: cues for hand placement.    Exercises      Balance    End of Session OT - End of Session Equipment Utilized During Treatment: Gait belt;Rolling walker Activity Tolerance: Patient tolerated treatment well Patient left: in bed;with call bell/phone within reach;with bed alarm set;with family/visitor present  GO     Benito Mccreedy OTR/L [id number] 08/23/2013, 10:55 AM

## 2013-08-24 ENCOUNTER — Inpatient Hospital Stay (HOSPITAL_COMMUNITY): Payer: Medicare Other | Admitting: Occupational Therapy

## 2013-08-24 ENCOUNTER — Inpatient Hospital Stay (HOSPITAL_COMMUNITY): Payer: Medicare Other | Admitting: Speech Pathology

## 2013-08-24 ENCOUNTER — Inpatient Hospital Stay (HOSPITAL_COMMUNITY): Payer: Medicare Other | Admitting: Physical Therapy

## 2013-08-24 DIAGNOSIS — I6350 Cerebral infarction due to unspecified occlusion or stenosis of unspecified cerebral artery: Secondary | ICD-10-CM

## 2013-08-24 DIAGNOSIS — I69991 Dysphagia following unspecified cerebrovascular disease: Secondary | ICD-10-CM

## 2013-08-24 DIAGNOSIS — E1142 Type 2 diabetes mellitus with diabetic polyneuropathy: Secondary | ICD-10-CM

## 2013-08-24 DIAGNOSIS — E1149 Type 2 diabetes mellitus with other diabetic neurological complication: Secondary | ICD-10-CM

## 2013-08-24 DIAGNOSIS — N186 End stage renal disease: Secondary | ICD-10-CM

## 2013-08-24 DIAGNOSIS — I10 Essential (primary) hypertension: Secondary | ICD-10-CM

## 2013-08-24 LAB — GLUCOSE, CAPILLARY
GLUCOSE-CAPILLARY: 204 mg/dL — AB (ref 70–99)
Glucose-Capillary: 356 mg/dL — ABNORMAL HIGH (ref 70–99)
Glucose-Capillary: 139 mg/dL — ABNORMAL HIGH (ref 70–99)
Glucose-Capillary: 269 mg/dL — ABNORMAL HIGH (ref 70–99)
Glucose-Capillary: 126 mg/dL — ABNORMAL HIGH (ref 70–99)

## 2013-08-24 NOTE — Progress Notes (Signed)
Occupational Therapy Session Note  Patient Details  Name: IVELIZ KNOUSE MRN: [id number] Date of Birth: 05/30/1941  Today's Date: 08/24/2013 Time: [id number] Time Calculation (min): 45 min  Short Term Goals: Week 1:  OT Short Term Goal 1 (Week 1): STG = LTGs due to ELOS  Skilled Therapeutic Interventions/Progress Updates:  Patient seated EOB visiting with friends and daughter in room.  Engaged in dynamic balance, visual scanning/attention with kitchen task and a simple paper and pen task to further screen vision.  Patient ambulated to therapy kitchen to continue to screen for visual deficits.  Patient was asked to identify what she saw in the kitchen, refrigerator and freezer.  She only named items on the left and often renamed them.  Placed pen and paper in front of her and when she read the instructions, she substituted a word then stated that she understood what to do.  Patient incorrectly completed the task and was unaware that she did not follow the instructions.  Reviewed the instructions again then demonstrated how to complete one of the rows.  Patient required mod-max cues to follow instructions as she tried to continue.  Patient able to complete 2 rows correctly without cues.  Patient appeared to have difficulty with the instructions more than difficulty with her vision.  Visual fields assessment indicated possible superior field deficit.  Saccades assessment attempted however patient unable to follow instruction.  Therapy Documentation Precautions:  Precautions Precautions: Fall Precaution Comments: diaylsis port Rt chest, IV Rt forearm, restricted Lt UE Restrictions Weight Bearing Restrictions: No Pain: Denies pain  See FIM for current functional status  Therapy/Group: Individual Therapy  Antoinette Borgwardt 08/24/2013, 4:52 PM

## 2013-08-24 NOTE — Evaluation (Signed)
Occupational Therapy Assessment and Plan  Patient Details  Name: [name] MRN: [id number] Date of Birth: 04/06/1941  OT Diagnosis: ataxia, hemiplegia affecting non-dominant side and muscle weakness (generalized) Rehab Potential: Rehab Potential: Excellent ELOS: 7-10 days   Today's Date: 08/24/2013 Time: 0730-0830 Time Calculation (min): 60 min  Problem List:  Patient Active Problem List   Diagnosis Date Noted  . Right hemiparesis 08/19/2013  . CVA (cerebral infarction) 07/20/2013  . Gastroparesis due to DM 07/01/2013  . End stage renal disease 06/25/2013  . Anemia 01/26/2013  . DM W/NEURO MNFST, TYPE II, UNCONTROLLED 11/19/2006  . LEUKOCYTOSIS 11/19/2006  . DEPENDENT EDEMA, LEGS, BILATERAL 11/19/2006  . SINUS TACHYCARDIA 10/16/2006  . DIABETES MELLITUS, TYPE II 05/20/2006  . HYPERLIPIDEMIA 05/20/2006  . SYNDROME, RESTLESS LEGS 05/20/2006  . PERIPHERAL NEUROPATHY 05/20/2006  . HYPERTENSION 05/20/2006    Past Medical History:  Past Medical History  Diagnosis Date  . Hypertension   . Cataract   . Diabetes mellitus   . Chronic kidney disease   . Shortness of breath   . Anemia   . CVA (cerebral infarction)    Past Surgical History:  Past Surgical History  Procedure Laterality Date  . No past surgeries    . Cataract extraction w/phaco  11/20/2011    Procedure: CATARACT EXTRACTION PHACO AND INTRAOCULAR LENS PLACEMENT (IOC);  Surgeon: Tonny Branch, MD;  [facility]: [facility];  Service: Ophthalmology;  Laterality: Right;  CDE 18.82  . Cataract extraction w/phaco Left 11/18/2012    Procedure: CATARACT EXTRACTION PHACO AND INTRAOCULAR LENS PLACEMENT (IOC);  Surgeon: Tonny Branch, MD;  Location: [facility];  Service: Ophthalmology;  Laterality: Left;  CDE: 18.97  . Colonoscopy N/A 02/09/2013    Procedure: COLONOSCOPY;  Surgeon: Rogene Houston, MD;  [facility];  Service: Endoscopy;  Laterality: N/A;  305-moved to 220 Ann to notify pt  . Insertion of dialysis catheter  Right 06/24/2013    Procedure: INSERTION OF DIALYSIS CATHETER: Ultrasound guided;  Surgeon: Serafina Mitchell, MD;  Location: [place];  Service: Vascular;  Laterality: Right;  . 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 Tee without cardioversion N/A 07/21/2013    Procedure: TRANSESOPHAGEAL ECHOCARDIOGRAM (TEE);  Surgeon: Dorothy Spark, MD;  Location[facility];  Service: Cardiovascular;  Laterality: N/A;    Assessment & Plan Clinical Impression: Patient is a 73 y.o. 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 Admitted 08/19/2013 with right side weakness as well as altered mental status. MRI of the brain showed scattered acute infarcts throughout both cerebral and cerebellar hemispheres. Followup neurology services aspirin discontinued placed on Plavix. A loop recorder in place from previous admission. No further stroke workup indicated. Hemodialysis ongoing as per renal services. Subcutaneous heparin for DVT prophylaxis. Currently maintained on dysphagia 2 nectar thick liquid diet. Physical and occupational therapy evaluations completed 08/20/2013 with recommendations for physical medicine rehabilitation consult.   Patient transferred to CIR on 08/23/2013 .    Patient currently requires mod with basic self-care skills secondary to impaired timing and sequencing, ataxia and decreased coordination and decreased standing balance.  Prior to hospitalization, patient could complete ADLs with supervision.  Patient will benefit from skilled intervention to  increase independence with basic self-care skills and increase level of independence with iADL prior  to discharge home with care partner.  Anticipate patient will require intermittent supervision and no further OT follow recommended.  OT - End of Session Activity Tolerance: Tolerates 30+ min activity without fatigue Endurance Deficit: No OT Assessment Rehab Potential: Excellent OT Patient demonstrates impairments in the following area(s): Balance;Motor OT Basic ADL's Functional Problem(s): Bathing;Dressing;Toileting OT Advanced ADL's Functional Problem(s): Simple Meal Preparation OT Transfers Functional Problem(s): Toilet;Tub/Shower OT Additional Impairment(s): Fuctional Use of Upper Extremity OT Plan OT Intensity: Minimum of 1-2 x/day, 45 to 90 minutes OT Frequency: 5 out of 7 days OT Duration/Estimated Length of Stay: 7-10 days OT Treatment/Interventions: Balance/vestibular training;Discharge planning;DME/adaptive equipment instruction;Functional mobility training;Neuromuscular re-education;Patient/family education;Psychosocial support;Self Care/advanced ADL retraining;Therapeutic Activities;Therapeutic Exercise;UE/LE Strength taining/ROM;UE/LE Coordination activities OT Self Feeding Anticipated Outcome(s): mod I OT Basic Self-Care Anticipated Outcome(s): supervision OT Toileting Anticipated Outcome(s): mod I OT Bathroom Transfers Anticipated Outcome(s): supervision OT Recommendation Patient destination: Home Follow Up Recommendations: 24 hour supervision/assistance Equipment Recommended: To be determined;Tub/shower bench   Skilled Therapeutic Intervention OT eval initiated, ADL assessment completed at sit > stand level at sink.  Pt ambulated to room bathroom with mod assist HHA.  Pt with appropriate initiation and sequencing throughout bathing and dressing, even attempting to don TEDS.  Pt with difficulty donning shirt, demonstrating slight decrease in problem solving skills.  Mild ataxia with UE assessment, however no apparent issues during functional tasks this session.    OT  Evaluation Precautions/Restrictions  Precautions Precautions: Fall Precaution Comments: diaylsis port Rt chest, IV Rt forearm, restricted Lt UE Restrictions Weight Bearing Restrictions: No Pain Pain Assessment Pain Assessment: No/denies pain Home Living/Prior Functioning Home Living Living Arrangements: Children Available Help at Discharge: Available 24 hours/day;Family;Other (Comment) (daughters Pricsilla and Derl Barrow have been providing 24/7 assist) Type of Home: House Home Access: Stairs to enter Technical brewer of Steps: 1 Home Layout: One level Additional Comments: daughters come in to assist when brothers work and to dialysis, housework, alternate nights sleeping at her house  Lives With: Family (lives with 2 sons who work 3rd shift) Prior Function Level of Independence: Needs assistance with ADLs;Needs assistance with gait (prior to CVA 07/20/13 pt Independent with all ADLs) Driving: No (prior to CVA 07/20/13 was driving) ADL  See FIM Vision/Perception  Vision - History Baseline Vision: Wears glasses only for reading Patient Visual Report: No change from baseline Vision - Assessment Eye Alignment: Within Functional Limits Vision Assessment: Vision tested Ocular Range of Motion: Within Functional Limits Tracking/Visual Pursuits: Able to track stimulus in all quads without difficulty Saccades: Within functional limits Perception Perception: Within Functional Limits Praxis Praxis: Intact  Cognition Arousal/Alertness: Awake/alert Orientation Level: Oriented to person;Oriented to place;Oriented to time;Oriented to situation Memory: Appears intact Awareness: Appears intact Problem Solving: Appears intact Safety/Judgment: Appears intact Sensation Sensation Light Touch: Appears Intact Stereognosis: Not tested Hot/Cold: Not tested Proprioception: Appears Intact Coordination Gross Motor Movements are Fluid and Coordinated: No Fine Motor Movements are Fluid and  Coordinated: No Finger Nose Finger Test: requires increased time, mild ataxia, difficulty with following directions initially Extremity/Trunk Assessment RUE Assessment RUE Assessment: Within Functional Limits LUE Assessment LUE Assessment: Exceptions to [facility] (strength grossly 4/5)  FIM:  FIM - Grooming Grooming Steps: Wash, rinse, dry face;Wash, rinse, dry hands Grooming: 4: Patient completes 3 of 4 or 4 of 5 steps FIM - Bathing Bathing Steps Patient Completed: Chest;Right Arm;Left Arm;Abdomen;Front perineal area;Buttocks;Right upper leg;Left upper leg;Right lower leg (including foot);Left lower leg (  including foot) Bathing: 4: Steadying assist FIM - Upper Body Dressing/Undressing Upper body dressing/undressing steps patient completed: Thread/unthread left sleeve of pullover shirt/dress;Put head through opening of pull over shirt/dress;Pull shirt over trunk Upper body dressing/undressing: 4: Min-Patient completed 75 plus % of tasks FIM - Lower Body Dressing/Undressing Lower body dressing/undressing steps patient completed: Thread/unthread right underwear leg;Thread/unthread left underwear leg;Pull underwear up/down;Thread/unthread right pants leg;Thread/unthread left pants leg;Pull pants up/down;Don/Doff right shoe;Don/Doff left shoe;Fasten/unfasten right shoe;Fasten/unfasten left shoe Lower body dressing/undressing: 4: Steadying Assist FIM - Bed/Chair Transfer Bed/Chair Transfer: 4: Supine > Sit: Min A (steadying Pt. > 75%/lift 1 leg);4: Bed > Chair or W/C: Min A (steadying Pt. > 75%) FIM - Tub/Shower Transfers Tub/Shower Assistive Devices: Walk in shower;Grab bars;Tub transfer bench Tub/shower Transfers: 3-Into Tub/Shower: Mod A (lift or lower/lift 2 legs);3-Out of Tub/Shower: Mod A (lift or lower/lift 2 legs)   Refer to Care Plan for Long Term Goals  Recommendations for other services: None  Discharge Criteria: Patient will be discharged from OT if patient refuses treatment 3  consecutive times without medical reason, if treatment goals not met, if there is a change in medical status, if patient makes no progress towards goals or if patient is discharged from hospital.  The above assessment, treatment plan, treatment alternatives and goals were discussed and mutually agreed upon: by patient and by family  Trude Cansler, [facility] 08/24/2013, 1:09 PM

## 2013-08-24 NOTE — Progress Notes (Signed)
Social Work Assessment and Plan Social Work Assessment and Plan  Patient Details  Name: [name] MRN: [id number] Date of Birth: 09/06/1940  Today's Date: 08/24/2013  Problem List:  Patient Active Problem List   Diagnosis Date Noted  . Right hemiparesis 08/19/2013  . CVA (cerebral infarction) 07/20/2013  . Gastroparesis due to DM 07/01/2013  . End stage renal disease 06/25/2013  . Anemia 01/26/2013  . DM W/NEURO MNFST, TYPE II, UNCONTROLLED 11/19/2006  . LEUKOCYTOSIS 11/19/2006  . DEPENDENT EDEMA, LEGS, BILATERAL 11/19/2006  . SINUS TACHYCARDIA 10/16/2006  . DIABETES MELLITUS, TYPE II 05/20/2006  . HYPERLIPIDEMIA 05/20/2006  . SYNDROME, RESTLESS LEGS 05/20/2006  . PERIPHERAL NEUROPATHY 05/20/2006  . HYPERTENSION 05/20/2006   Past Medical History:  Past Medical History  Diagnosis Date  . Hypertension   . Cataract   . Diabetes mellitus   . Chronic kidney disease   . Shortness of breath   . Anemia   . CVA (cerebral infarction)    Past Surgical History:  Past Surgical History  Procedure Laterality Date  . No past surgeries    . Cataract extraction w/phaco  11/20/2011    Procedure: CATARACT EXTRACTION PHACO AND INTRAOCULAR LENS PLACEMENT (IOC);  Surgeon: Tonny Branch, MD;  Location: [facility];  Service: Ophthalmology;  Laterality: Right;  CDE 18.82  . Cataract extraction w/phaco Left 11/18/2012    Procedure: CATARACT EXTRACTION PHACO AND INTRAOCULAR LENS PLACEMENT (IOC);  Surgeon: Tonny Branch, MD;  Location: [facility];  Service: Ophthalmology;  Laterality: Left;  CDE: 18.97  . Colonoscopy N/A 02/09/2013    Procedure: COLONOSCOPY;  Surgeon: Rogene Houston, MD;  [facility];  Service: Endoscopy;  Laterality: N/A;  305-moved to 220 Ann to notify pt  . Insertion of dialysis catheter Right 06/24/2013    Procedure: INSERTION OF DIALYSIS CATHETER: Ultrasound guided;  Surgeon: Serafina Mitchell, MD;  Location: [place];  Service: Vascular;  Laterality: Right;  . Loop recorder  implant  07-21-13    MDT LinQ implanted by Dr Lovena Le for cryptogenic stroke  . Tee without cardioversion N/A 07/21/2013    Procedure: TRANSESOPHAGEAL ECHOCARDIOGRAM (TEE);  Surgeon: Dorothy Spark, MD;  Location: [place];  Service: Cardiovascular;  Laterality: N/A;   Social History:  reports that she has never smoked. She has never used smokeless tobacco. She reports that she does not drink alcohol or use illicit drugs.  Family / Support Systems Marital Status: Widow/Widower Patient Roles: Parent Children: Priscilla-daughter  [id number] Other Supports: Vonda-daughter  [id number] Anticipated Caregiver: Daughter's and son's how are there after work Ability/Limitations of Caregiver: Between them they will provide 24 hour care, all are very committed to pt and her care Caregiver Availability: 24/7 Family Dynamics: Close family who are committed to each other and will provide care and support.  Daughter is here and will observe in therapies today.  She is close with her Mom.  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   Abuse/Neglect Physical Abuse: Denies Verbal Abuse: Denies Sexual Abuse: Denies Exploitation of patient/patient's resources: Denies Self-Neglect: Denies  Emotional Status Pt's affect, behavior adn adjustment status: Pt is motivated to improve and ready to work hard in therapies to get to the level to go home.  She has always recovered in the past but these last few months have  been difficult with new HD and CVA's.  She has a strong faith to get her thorugh.  Daughter agrees Pierre Bali is a very Scientist, research (physical sciences). Recent Psychosocial Issues: Other medical issues-new HD and now these CVA's Pyschiatric History: No  history-deferred depression screen due to  eating breakfast.  She seems to be coping appropriately and ready to work in therapies.  Will monitor while here and ask team to observe her coping while here. Substance Abuse History: No issues  Patient / Family Perceptions, Expectations & Goals Pt/Family understanding of illness & functional limitations: Pt and daughter have a good understanding of her strokes and deficits.  Both are hopeful she will do well here and make good progress.  She ahs doen well in the past but main cocnern is to stop them, she ahd a few mini and now two bigger strokes. Premorbid pt/family roles/activities: Mother, Sister, grandmother, Retiree, Church member, etc Anticipated changes in roles/activities/participation: resume at discharge Pt/family expectations/goals: Pt states: " I want to be able to do for myself, not burden my children."  Daughter states; " We will do waht she needs [place] to do, she is the only Mother we have."  [employer]: Other (Comment) (HD-M-W-F [place]) Premorbid Home Care/DME Agencies: Other (Comment) ([facility]-followed PTA) Transportation available at discharge: Family Resource referrals recommended: Support group (specify) (CVA SUpport group)  Discharge Planning Living Arrangements: Children Support Systems: Children;Friends/neighbors;Church/faith community Type of Residence: Private residence Insurance Resources: Multimedia programmer (specify) ([employer]_ Medicare) Financial Resources: Radio broadcast assistant Screen Referred: No Living Expenses: Lives with family Money Management: Patient;Family Does the patient have any problems obtaining your medications?: No Home Management: Daughter's and pt does some Patient/Family Preliminary Plans: Return home with children assisting-daughter's during the day and son's are there after work.  Daughter is here to observe in therapies and provide support to pt.  Daughter reports they will  be here often to see Mom in therpaies and learn her care. Social Work Anticipated Follow Up Needs: HH/OP;Support Group  Clinical Impression Very pleasant motivated female who is willnig to work hard and make progress.  Somewhat frustrated with continuing to have CVA's but is hopeful she will do well and recover. Supportive daughter who reports between she and her brother's Mom will have 24 hour care.  Work on discharge plan and await team's evaluations.  Elease Hashimoto 08/24/2013, 9:04 AM

## 2013-08-24 NOTE — Evaluation (Signed)
Speech Language Pathology Assessment and Plan  Patient Details  Name: [name] MRN: [id number] Date of Birth: 02/28/1941  SLP Diagnosis: Cognitive Impairments;Dysphagia  Rehab Potential: Good ELOS: 08/30/13   Today's Date: 08/24/2013 Time: 0832--0930 Time Calculation (min): 58 min  Problem List:  Patient Active Problem List   Diagnosis Date Noted  . Right hemiparesis 08/19/2013  . CVA (cerebral infarction) 07/20/2013  . Gastroparesis due to DM 07/01/2013  . End stage renal disease 06/25/2013  . Anemia 01/26/2013  . DM W/NEURO MNFST, TYPE II, UNCONTROLLED 11/19/2006  . LEUKOCYTOSIS 11/19/2006  . DEPENDENT EDEMA, LEGS, BILATERAL 11/19/2006  . SINUS TACHYCARDIA 10/16/2006  . DIABETES MELLITUS, TYPE II 05/20/2006  . HYPERLIPIDEMIA 05/20/2006  . SYNDROME, RESTLESS LEGS 05/20/2006  . PERIPHERAL NEUROPATHY 05/20/2006  . HYPERTENSION 05/20/2006   Past Medical History:  Past Medical History  Diagnosis Date  . Hypertension   . Cataract   . Diabetes mellitus   . Chronic kidney disease   . Shortness of breath   . Anemia   . CVA (cerebral infarction)    Past Surgical History:  Past Surgical History  Procedure Laterality Date  . No past surgeries    . Cataract extraction w/phaco  11/20/2011    Procedure: CATARACT EXTRACTION PHACO AND INTRAOCULAR LENS PLACEMENT (IOC);  Surgeon: Tonny Branch, MD;  [facility];  Service: Ophthalmology;  Laterality: Right;  CDE 18.82  . Cataract extraction w/phaco Left 11/18/2012    Procedure: CATARACT EXTRACTION PHACO AND INTRAOCULAR LENS PLACEMENT (IOC);  Surgeon: Tonny Branch, MD;  Location[facility];  Service: Ophthalmology;  Laterality: Left;  CDE: 18.97  . Colonoscopy N/A 02/09/2013    Procedure: COLONOSCOPY;  Surgeon: Rogene Houston, MD;  [facility];  Service: Endoscopy;  Laterality: N/A;  305-moved to 220 Ann to notify pt  . Insertion of dialysis catheter Right 06/24/2013    Procedure: INSERTION OF DIALYSIS CATHETER:  Ultrasound guided;  Surgeon: Serafina Mitchell, MD;  Location: [place];  Service: Vascular;  Laterality: Right;  . 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 Tee without cardioversion N/A 07/21/2013    Procedure: TRANSESOPHAGEAL ECHOCARDIOGRAM (TEE);  Surgeon: Dorothy Spark, MD;  Location: [place];  Service: Cardiovascular;  Laterality: N/A;    Assessment / Plan / Recommendation Clinical Impression [name] is a 73 y.o. 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 Admitted 08/19/2013 with right side weakness as well as altered mental status. MRI of the brain showed scattered acute infarcts throughout both cerebral and cerebellar hemispheres. Follow-up neurology services aspirin discontinued placed on Plavix. A loop recorder in place from previous admission. Hemodialysis ongoing as per renal services. Currently maintained on dysphagia 2 nectar-thick liquid diet. Physical and occupational therapy evaluations completed 08/20/2013 with recommendations for physical medicine rehabilitation consult. Patient was admitted for comprehensive rehabilitation program 08/23/13.  Orders received and cognitive-linguistic and bedside swallow evaluations completed.  Patient exhibits mild-moderate cognitive deficits with impairments in storage and retrieval of information as well as impaired processing.  Patient's poor awareness negatively impacts her ability to compensate for cognitive deficits.  Bedside swallow evaluation also completed and patient demonstrated no overt s/s of aspiration with consumption of Dys.3  textures and thin liquids via straw and as a  result diet advancement is recommended.  To maximize overall safety with self-feeding as well as other home management tasks it is recommended that this patient receive skilled SLP services during CIR stay prior to discharge home with 24/7 family assist.       SLP Assessment  Patient will need skilled Speech Lanaguage Pathology Services during CIR admission    Recommendations  Diet Recommendations: Dysphagia 3 (Mechanical Soft);Thin liquid Liquid Administration via: Cup;No straw Medication Administration: Whole meds with puree Supervision: Full supervision/cueing for compensatory strategies;Trained caregiver to feed patient Compensations: Slow rate;Small sips/bites;Check for pocketing Postural Changes and/or Swallow Maneuvers: Seated upright 90 degrees;Upright 30-60 min after meal Oral Care Recommendations: Oral care BID Patient destination: Home Follow up Recommendations: Other (comment) (TBD) Equipment Recommended: None recommended by SLP    SLP Frequency 5 out of 7 days   SLP Treatment/Interventions Cognitive remediation/compensation;Cueing hierarchy;Internal/external aids;Patient/family education;Speech/Language facilitation;Environmental controls;Functional tasks;Therapeutic Activities    Pain Pain Assessment Pain Assessment: No/denies pain Prior Functioning Cognitive/Linguistic Baseline: Within functional limits (daughter managed complex tasks PTA) Type of Home: House  Lives With: Family;Son Available Help at Discharge: Family;Available 24 hours/day Vocation: Retired  Industrial/product designer Term Goals: Week 1: SLP Short Term Goal 1 (Week 1): STG=LTG  See FIM for current functional status Refer to Care Plan for Long Term Goals  Recommendations for other services: None  Discharge Criteria: Patient will be discharged from SLP if patient refuses treatment 3 consecutive times without medical reason, if treatment goals not met, if there is a change in medical status, if patient makes no progress  towards goals or if patient is discharged from hospital.  The above assessment, treatment plan, treatment alternatives and goals were discussed and mutually agreed upon: by patient and by family  Carmelia Roller., [place] 08/24/2013, 5:25 PM

## 2013-08-24 NOTE — Progress Notes (Signed)
S: No new CO.  UP walking with PT O:BP 147/76  Pulse 89  Temp(Src) 98.9 F (37.2 C) (Oral)  Resp 18  Wt 72.1 kg (158 lb 15.2 oz)  SpO2 98%  Intake/Output Summary (Last 24 hours) at 08/24/13 1512 Last data filed at 08/24/13 0852  Gross per 24 hour  Intake    120 ml  Output      0 ml  Net    120 ml   Weight change:  [id number] and alert CVS:RRR Resp:clear Abd:+ BS NTND Ext:no edema NEURO:CNI Ox3 no asterixis.   Rt permcath   . calcium carbonate  1 tablet Oral TID WC  . clopidogrel  75 mg Oral Q breakfast  . [START ON 08/30/2013] darbepoetin (ARANESP) injection - DIALYSIS  60 mcg Intravenous Q Tue-HD  . [START ON 08/25/2013] doxercalciferol  1 mcg Intravenous Q T,Th,Sa-HD  . erythromycin ethylsuccinate  200 mg Oral TID WC & HS  . [START ON 08/30/2013] ferric gluconate (FERRLECIT/NULECIT) IV  62.5 mg Intravenous Q Tue-HD  . heparin  5,000 Units Subcutaneous 3 times per day  . insulin aspart  0-9 Units Subcutaneous TID WC  . insulin NPH Human  5 Units Subcutaneous BID AC & HS  . labetalol  200 mg Oral BID  . multivitamin  1 tablet Oral QHS  . pantoprazole sodium  40 mg Oral Daily   No results found. BMET    Component Value Date/Time   NA 139 08/23/2013 1115   K 5.1 08/23/2013 1115   CL 99 08/23/2013 1115   CO2 27 08/23/2013 1115   GLUCOSE 209* 08/23/2013 1115   BUN 43* 08/23/2013 1115   CREATININE 6.25* 08/23/2013 1115   CALCIUM 8.3* 08/23/2013 1115   GFRNONAA 6* 08/23/2013 1115   GFRAA 7* 08/23/2013 1115   CBC    Component Value Date/Time   WBC 10.3 08/23/2013 1114   RBC 3.05* 08/23/2013 1114   HGB 8.8* 08/23/2013 1114   HCT 26.2* 08/23/2013 1114   PLT 201 08/23/2013 1114   MCV 85.9 08/23/2013 1114   MCH 28.9 08/23/2013 1114   MCHC 33.6 08/23/2013 1114   RDW 16.6* 08/23/2013 1114   LYMPHSABS 1.4 07/20/2013 0910   MONOABS 1.7* 07/20/2013 0910   EOSABS 0.0 07/20/2013 0910   BASOSABS 0.0 07/20/2013 0910     Assessment: 1. Acute stroke, ? watershed from hypotension 2.  ESRD 3.  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 Anemia on aranesp 5. HTN, BP variable but I think that will have to be acceptable to all in order to prevent hypotension. 6. DM  Plan: 1.HD tomorrow.  Will check labs at HD 2. I notified [facility] that pt is in [place].  Pt says she is scheduled for access placement next tues. 3. She should be on renal diet  Marcelia Petersen T

## 2013-08-24 NOTE — Care Management Note (Signed)
Inpatient [facility] Individual Statement of Services  Patient Name:  [name]  Date:  08/24/2013  Welcome to the [facility].  Our goal is to provide you with an individualized program based on your diagnosis and situation, designed to meet your specific needs.  With this comprehensive rehabilitation program, you will be expected to participate in at least 3 hours of rehabilitation therapies Monday-Friday, with modified therapy programming on the weekends.  Your rehabilitation program will include the following services:  Physical Therapy (PT), Occupational Therapy (OT), Speech Therapy (ST), 24 hour per day rehabilitation nursing, Case Management (Social Worker), Rehabilitation Medicine, Nutrition Services and Pharmacy Services  Weekly team conferences will be held on Wednesday to discuss your progress.  Your Social Worker will talk with you frequently to get your input and to update you on team discussions.  Team conferences with you and your family in attendance may also be held.  Expected length of stay: 7-10 days  Overall anticipated outcome: supervision level  Depending on your progress and recovery, your program may change. Your Social Worker will coordinate services and will keep you informed of any changes. Your Social Worker's name and contact numbers are listed  below.  The following services may also be recommended but are not provided by the [facility]:   [facility] will be made to provide these services after discharge if needed.  Arrangements include referral to agencies that provide these services.  Your insurance has been verified to be:  [employer]-Medicare Your primary doctor is:  DR [name]  Pertinent information will be shared with your doctor and your insurance company.  Social Worker:  Ovidio Kin, [place] or ([phone]  Information discussed with and copy given to patient by: Elease Hashimoto, 08/24/2013, 8:43 AM

## 2013-08-24 NOTE — Evaluation (Signed)
Physical Therapy Assessment and Plan  Patient Details  Name: [name] MRN: [id number] Date of Birth: 08/23/1940  PT Diagnosis: Abnormality of gait, Ataxia, Cognitive deficits, Coordination disorder, Difficulty walking, Hemiparesis dominant and Muscle weakness Rehab Potential: Good ELOS: 7 days   Today's Date: 08/24/2013 Time: 1300-1400 Time Calculation (min): 60 min  Problem List:  Patient Active Problem List   Diagnosis Date Noted  . Right hemiparesis 08/19/2013  . CVA (cerebral infarction) 07/20/2013  . Gastroparesis due to DM 07/01/2013  . End stage renal disease 06/25/2013  . Anemia 01/26/2013  . DM W/NEURO MNFST, TYPE II, UNCONTROLLED 11/19/2006  . LEUKOCYTOSIS 11/19/2006  . DEPENDENT EDEMA, LEGS, BILATERAL 11/19/2006  . SINUS TACHYCARDIA 10/16/2006  . DIABETES MELLITUS, TYPE II 05/20/2006  . HYPERLIPIDEMIA 05/20/2006  . SYNDROME, RESTLESS LEGS 05/20/2006  . PERIPHERAL NEUROPATHY 05/20/2006  . HYPERTENSION 05/20/2006    Past Medical History:  Past Medical History  Diagnosis Date  . Hypertension   . Cataract   . Diabetes mellitus   . Chronic kidney disease   . Shortness of breath   . Anemia   . CVA (cerebral infarction)    Past Surgical History:  Past Surgical History  Procedure Laterality Date  . No past surgeries    . Cataract extraction w/phaco  11/20/2011    Procedure: CATARACT EXTRACTION PHACO AND INTRAOCULAR LENS PLACEMENT (IOC);  Surgeon: Tonny Branch, MD;  Location: [facility];  Service: Ophthalmology;  Laterality: Right;  CDE 18.82  . Cataract extraction w/phaco Left 11/18/2012    Procedure: CATARACT EXTRACTION PHACO AND INTRAOCULAR LENS PLACEMENT (IOC);  Surgeon: Tonny Branch, MD;  Location: [facility];  Service: Ophthalmology;  Laterality: Left;  CDE: 18.97  . Colonoscopy N/A 02/09/2013    Procedure: COLONOSCOPY;  Surgeon: Rogene Houston, MD;  Location: [facility];  Service: Endoscopy;  Laterality: N/A;  305-moved to 220 Ann to notify pt  . Insertion of  dialysis catheter Right 06/24/2013    Procedure: INSERTION OF DIALYSIS CATHETER: Ultrasound guided;  Surgeon: Serafina Mitchell, MD;  Location: [facility];  Service: Vascular;  Laterality: Right;  . 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 Tee without cardioversion N/A 07/21/2013    Procedure: TRANSESOPHAGEAL ECHOCARDIOGRAM (TEE);  Surgeon: Dorothy Spark, MD;  Location: [facility] ENDOSCOPY;  Service: Cardiovascular;  Laterality: N/A;    Assessment & Plan Clinical Impression: Patient is a 73 y.o. 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 Admitted 08/19/2013 with right side weakness as well as altered mental status. MRI of the brain showed scattered acute infarcts throughout both cerebral and cerebellar hemispheres. Followup neurology services aspirin discontinued placed on Plavix. A loop recorder in place from previous admission. No further stroke workup indicated. Hemodialysis ongoing as per renal services. Subcutaneous heparin for DVT prophylaxis.  Patient transferred to CIR on 08/23/2013 .   Patient currently requires min A with mobility secondary to muscle weakness, impaired timing and sequencing, ataxia, decreased coordination and decreased motor planning, decreased visual perceptual skills and decreased visual motor skills, decreased attention to right, decreased attention, decreased awareness, decreased problem solving, decreased memory and delayed processing and decreased standing balance, decreased postural control, decreased balance strategies and hemiparesis.  Prior  to hospitalization, patient was modified independent  with mobility and lived with  Family;Son in a House home.  Home access is 1 (cement porch)Stairs to enter.  Patient will benefit from skilled PT intervention to maximize safe functional mobility, minimize fall risk and decrease caregiver burden for planned discharge home with 24 hour supervision.  Anticipate patient will benefit from follow up OP at discharge.  PT - End of Session Activity Tolerance: Tolerates 30+ min activity without fatigue;Endurance does not limit participation in activity Endurance Deficit: No PT Assessment Rehab Potential: Good PT Patient demonstrates impairments in the following area(s): Balance;Motor;Perception PT Transfers Functional Problem(s): Bed to Chair;Car;Furniture;Floor PT Locomotion Functional Problem(s): Ambulation;Stairs PT Plan PT Intensity: Minimum of 1-2 x/day ,45 to 90 minutes PT Frequency: 5 out of 7 days PT Duration Estimated Length of Stay: 7 days PT Treatment/Interventions: Ambulation/gait training;Balance/vestibular training;Cognitive remediation/compensation;Discharge planning;DME/adaptive equipment instruction;Functional mobility training;Neuromuscular re-education;Patient/family education;Stair training;Therapeutic Activities;Therapeutic Exercise;UE/LE Strength taining/ROM;UE/LE Coordination activities;Visual/perceptual remediation/compensation PT Transfers Anticipated Outcome(s): mod I PT Locomotion Anticipated Outcome(s): supervision PT Recommendation Recommendations for Other Services: Neuropsych consult Follow Up Recommendations: Outpatient PT;24 hour supervision/assistance Patient destination: Home Equipment Recommended: None recommended by PT Equipment Details: Has RW  Skilled Therapeutic Intervention Pt participated in PT eval; discussed impairments with pt and daughter and focus of therapy.  Secondary to balance impairments noted during static standing balance testing, vision and ocular motor tested with mild deficits in oculomotor noted.  When pt asked to read  sign pt noted to substitute words and skip other words.  Will ask OT to examine more closely.     PT Evaluation Precautions/Restrictions Precautions Precautions: Fall Precaution Comments: diaylsis port Rt chest, IV Rt forearm, restricted Lt UE General Chart Reviewed: Yes Response to Previous Treatment: Patient with no complaints from previous session. Family/Caregiver Present: Yes (daughter)  Vital SignsTherapy Vitals Temp: 98.6 F (37 C) Temp src: Oral Pulse Rate: 87 Resp: 18 BP: 167/80 mmHg Patient Position, if appropriate: Sitting Oxygen Therapy SpO2: 100 % O2 Device: None (Room air) Pain Pain Assessment Pain Assessment: No/denies pain Home Living/Prior Functioning Home Living Available Help at Discharge: Family;Available 24 hours/day Type of Home: House Home Access: Stairs to enter [employer] of Steps: 1 (Technical sales engineer) Entrance Stairs-Rails: None Home Layout: One level Additional Comments: daughters come in to assist when brothers work and to dialysis, housework, alternate nights sleeping at her house  Lives With: Family;Son Prior Function Level of Independence: Independent with gait;Independent with transfers;Requires assistive device for independence  Able to Take Stairs?: Yes Driving: No Vocation: Retired Radiographer, therapeutic - History Baseline Vision: Wears glasses only for reading Visual History: Cataracts Patient Visual Report: Overshooting (reports premorbid blurring in L eye) Vision - Assessment Eye Alignment: Within Functional Limits Vision Assessment: Vision tested Ocular Range of Motion: Within Functional Limits Tracking/Visual Pursuits: Decreased smoothness of horizontal tracking;Decreased smoothness of vertical tracking Saccades: Overshoots;Decreased speed of saccadic movement;Additional head turns occurred during testing Perception Perception: Impaired Inattention/Neglect: Does not attend to right side of body Praxis Praxis:  Impaired Praxis Impairment Details: Motor planning  Cognition Overall Cognitive Status: Impaired/Different from baseline Arousal/Alertness: Awake/alert Orientation Level: Oriented X4 Attention: Selective Selective Attention: Appears intact Memory: Impaired Memory Impairment: Storage deficit;Retrieval deficit;Decreased short term memory;Decreased recall of new information Decreased Short Term Memory: Verbal basic;Functional basic Awareness: Impaired Awareness Impairment: Emergent impairment Problem Solving: Impaired Problem Solving Impairment: Verbal basic;Functional basic Safety/Judgment: Appears intact Sensation Sensation Light Touch: Appears Intact Stereognosis: Not tested Hot/Cold: Not tested Proprioception: Impaired by gross assessment Additional Comments: Impaired  proprioception vs. inattention RUE Coordination Gross Motor Movements are Fluid and Coordinated: No Fine Motor Movements are Fluid and Coordinated: No Finger Nose Finger Test: requires increased time, mild ataxia/impaired coordination, difficulty with following directions initially; demonstrates unconscious mirroring of movement with RUE when LUE performing task Heel Shin Test: requires increased time, mild ataxia/coordination; was not aware that heel was not touching L shin Motor  Motor Motor: Hemiplegia;Ataxia;Abnormal postural alignment and control;Motor impersistence Motor - Skilled Clinical Observations: R weakness/heaviness > L; mild UE and LE ataxia and motor impersistence; drifts posterior and R in standing  Mobility Bed Mobility Bed Mobility: Supine to Sit;Sit to Supine Supine to Sit: 6: Modified independent (Device/Increase time) Sit to Supine: 6: Modified independent (Device/Increase time) Transfers Transfers: Yes Stand Pivot Transfers: 4: Min assist Stand Pivot Transfer Details (indicate cue type and reason):  Stand pivot w/c <> mat with verbal cues and extra time for initiation and min A for balance  while pivoting Locomotion  Ambulation Ambulation/Gait Assistance: 4: Min assist Ambulation Distance (Feet): 150 Feet Ambulation/Gait Assistance Details: Without AD in controlled environment; R drift noted during gait that worsened with head turns and conversation Gait Gait: Yes Gait Pattern: Impaired Gait Pattern: Step-through pattern;Decreased weight shift to left;Decreased trunk rotation;Narrow base of support Stairs / Additional Locomotion Stairs: Yes Stairs Assistance: 4: Min assist Stairs Assistance Details (indicate cue type and reason): Performed stairs with and without UE support on rails with pt electing to perform ascending alternating sequence and descend with step to sequence  Stair Management Technique: Two rails;No rails;Alternating pattern;Step to pattern;Forwards Number of Stairs: 10 Height of Stairs: 6 Wheelchair Mobility Wheelchair Mobility: Yes Wheelchair Assistance: 2: Max Lexicographer: Both upper extremities;Both lower extermities Wheelchair Parts Management: Needs assistance Distance: 150 in controlled environment initially with bilat LE propulsion and then switching to UE propulsion with verbal and visual cues for sequencing and attention to RUE   Trunk/Postural Assessment  Cervical Assessment Cervical Assessment: Within Functional Limits Thoracic Assessment Thoracic Assessment: Within Functional Limits Lumbar Assessment Lumbar Assessment: Within Functional Limits Postural Control Postural Control: Deficits on evaluation (drifts posterior in static standing; drifts to R during gait; sway decreases with eyes closed)  Balance Static Sitting Balance Static Sitting - Balance Support: Feet supported Static Sitting - Level of Assistance: 7: Independent Dynamic Sitting Balance Dynamic Sitting - Balance Support: Feet supported Dynamic Sitting - Level of Assistance: 6: Modified independent (Device/Increase time) Static Standing Balance Static  Standing - Balance Support: No upper extremity supported Static Standing - Level of Assistance: 4: Min assist;3: Mod assist Static Stance: Eyes Closed: 30 seconds with decreased sway with eyes closed; eyes open 28 seconds secondary to posterior LOB Static Stance: on Foam: unable to release therapist's hand with increased sway posterior Static Stance: on Foam, Eyes Closed: decreased sway but still unable to release therapist's hand Dynamic Standing Balance Dynamic Standing - Balance Support: No upper extremity supported Dynamic Standing - Level of Assistance: 4: Min assist Extremity Assessment  RLE Assessment RLE Assessment: Exceptions to [facility] RLE Strength RLE Overall Strength: Deficits RLE Overall Strength Comments: 4-/5 with motor impersistence LLE Assessment LLE Assessment: Exceptions to WFL LLE Strength LLE Overall Strength: Deficits LLE Overall Strength Comments: 4+/5  FIM:  FIM - Control and instrumentation engineer Devices: Arm rests Bed/Chair Transfer: 5: Supine > Sit: Supervision (verbal cues/safety issues);5: Sit > Supine: Supervision (verbal cues/safety issues);4: Bed > Chair or W/C: Min A (steadying Pt. > 75%);4: Chair or W/C > Bed: Min A (steadying Pt. >  75%) FIM - Locomotion: Wheelchair Distance: 150 in controlled environment initially with bilat LE propulsion and then switching to UE propulsion with verbal and visual cues for sequencing and attention to RUE  Locomotion: Wheelchair: 2: Travels 150 ft or more: maneuvers on rugs and over door sills with maximal assistance (Pt: 25 - 49%) FIM - Locomotion: Ambulation Ambulation/Gait Assistance: 4: Min assist Locomotion: Ambulation: 4: Travels 150 ft or more with minimal assistance (Pt.>75%) FIM - Locomotion: Stairs Locomotion: Scientist, physiological: Hand rail - 2 Locomotion: Stairs: 2: Up and Down 4 - 11 stairs with minimal assistance (Pt.>75%)   Refer to Care Plan for Long Term Goals  Recommendations for  other services: Neuropsych  Discharge Criteria: Patient will be discharged from PT if patient refuses treatment 3 consecutive times without medical reason, if treatment goals not met, if there is a change in medical status, if patient makes no progress towards goals or if patient is discharged from hospital.  The above assessment, treatment plan, treatment alternatives and goals were discussed and mutually agreed upon: by patient and by family  Malachy Mood 08/24/2013, 5:28 PM

## 2013-08-24 NOTE — Progress Notes (Signed)
Social Work Patient ID: [name], female   DOB: 06/20/1940, 73 y.o.   MRN: [id number] Met with pt and daughter to inform of team conference goals- supervision level and discharge 3/17.  Daughter was surprised that it was so quick and pt had only been here less than one day. Discussed this was from the team evaluation this am, but will continue to assess and go from there.  Wanted to give heads up pt would be short length of stay and need someone with her at home. Will work on discharge plans.

## 2013-08-24 NOTE — Patient Care Conference (Signed)
Inpatient RehabilitationTeam Conference and Plan of Care Update Date: 08/24/2013   Time: 11:25 Am    Patient Name: [name]      Medical Record Number: [id number]  Date of Birth: 1941-06-15 Sex: Female         Room/Bed: 4M11C/4M11C-01 Payor Info: Payor: [employer] park [employer] MEDICARE / Plan: AARP MEDICARE COMPLETE / Product Type: *No Product type* /    Admitting Diagnosis: ESRD End Stage Renal Disease  Admit Date/Time:  08/23/2013  7:21 PM Admission Comments: No comment available   Primary Diagnosis:  <principal problem not specified> Principal Problem: <principal problem not specified>  Patient Active Problem List   Diagnosis Date Noted  . Right hemiparesis 08/19/2013  . CVA (cerebral infarction) 07/20/2013  . Gastroparesis due to DM 07/01/2013  . End stage renal disease 06/25/2013  . Anemia 01/26/2013  . DM W/NEURO MNFST, TYPE II, UNCONTROLLED 11/19/2006  . LEUKOCYTOSIS 11/19/2006  . DEPENDENT EDEMA, LEGS, BILATERAL 11/19/2006  . SINUS TACHYCARDIA 10/16/2006  . DIABETES MELLITUS, TYPE II 05/20/2006  . HYPERLIPIDEMIA 05/20/2006  . SYNDROME, RESTLESS LEGS 05/20/2006  . PERIPHERAL NEUROPATHY 05/20/2006  . HYPERTENSION 05/20/2006    Expected Discharge Date: Expected Discharge Date: 08/30/13  Team Members Present: Physician leading conference: Dr. Alysia Penna Social Worker Present: Ovidio Kin, LCSW;Jenny Prevatt, LCSW Nurse Present: Dorthula Nettles, RN PT Present: Raylene Everts, PT;Emily Parcell, PT OT Present: Simonne Come, Lorelee Cover, OT SLP Present: Gunnar Fusi, SLP PPS Coordinator present : Ileana Ladd, Lelan Pons, RN, CRRN     Current Status/Progress Goal Weekly Team Focus  Medical   ESRD on HD, recurrent CVA   upgrade fnxl status  initiate PT and OT   Bowel/Bladder   Pt continent of bowel and bladder with periods of incontinence  remain continent of bowel and bladder  main toileting regimen q 1-2 hr and PRN   Swallow/Nutrition/ Hydration   Dys.3 textures and thin liquids   least restrictive PO intake Mod I   toleration and trials of regular textures   ADL's   mod assist HHA ambulation, min assist overall with bathing and dressing  supervision  self-care retraining, functional mobility   Mobility   min A  supervision; mod I basic transfers  higher level balance and gait; coordination   Communication     [facility]        Safety/Cognition/ Behavioral Observations  Min assist   Supervision   increase recall and processing    Pain   Pt has no complaints of pain currently  remain free of pain      Skin   No current skin breakdown noted  Remain free of skin breakdown  Asses skin q shift and PRN for new skin breakdown      *See Care Plan and progress notes for long and short-term goals.  Barriers to Discharge: on HD    Possible Resolutions to Barriers:  Upgrade to Mod I    Discharge Planning/Teaching Needs:  Home with family providing 24 hr care-daughter's and son's to assist      Team Discussion:  Pt's goals -supervision level-being evaluated today and doing well.  Daughter here to observe in therapies.  Revisions to Treatment Plan:  New eval   Continued Need for Acute Rehabilitation Level of Care: The patient requires daily medical management by a physician with specialized training in physical medicine and rehabilitation for the following conditions: Daily direction of a multidisciplinary physical rehabilitation program to ensure safe treatment while eliciting the highest outcome that is of  practical value to the patient.: Yes Daily medical management of patient stability for increased activity during participation in an intensive rehabilitation regime.: Yes Daily analysis of laboratory values and/or radiology reports with any subsequent need for medication adjustment of medical intervention for : Neurological problems;Other  [name] 08/25/2013, 8:41 AM

## 2013-08-24 NOTE — H&P (Signed)
Physical Medicine and Rehabilitation Admission H&P      Chief Complaint   Patient presents with   .  Weakness    :   Chief complaint: Right side weakness   HPI: [name] is a 73 y.o. 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 Admitted 08/19/2013 with right side weakness as well as altered mental status. MRI of the brain showed scattered acute infarcts throughout both cerebral and cerebellar hemispheres. Followup neurology services aspirin discontinued placed on Plavix. A loop recorder in place from previous admission. No further stroke workup indicated. Hemodialysis ongoing as per renal services. Subcutaneous heparin for DVT prophylaxis. Currently maintained on dysphagia 2 nectar thick liquid diet. Physical and occupational therapy evaluations completed 08/20/2013 with recommendations for physical medicine rehabilitation consult. Patient was admitted for comprehensive rehabilitation program  Denies pain issues, able to tolerate OT ADL program without issues   ROS Review of Systems   Respiratory: Positive for shortness of breath.   Gastrointestinal: Positive for constipation.   Neurological: Positive for weakness.   All other systems reviewed and are negative    Past Medical History   Diagnosis  Date   .  Hypertension     .  Cataract     .  Diabetes mellitus     .  Chronic kidney disease     .  Shortness of breath     .  Anemia     .  CVA (cerebral infarction)      Past Surgical History   Procedure  Laterality  Date   .  No past surgeries       .  Cataract extraction w/phaco    11/20/2011       Procedure: CATARACT EXTRACTION PHACO AND INTRAOCULAR LENS PLACEMENT  (IOC);  Surgeon: Tonny Branch, MD;  Location: [facility];  Service: Ophthalmology;  Laterality: Right;  CDE 18.82   .  Cataract extraction w/phaco  Left  11/18/2012       Procedure: CATARACT EXTRACTION PHACO AND INTRAOCULAR LENS PLACEMENT (IOC);  Surgeon: Tonny Branch, MD;  Location: [facility];  Service: Ophthalmology;  Laterality: Left;  CDE: 18.97   .  Colonoscopy  N/A  02/09/2013       Procedure: COLONOSCOPY;  Surgeon: Rogene Houston, MD;  Location: [facility];  Service: Endoscopy;  Laterality: N/A;  305-moved to 220 Ann to notify pt   .  Insertion of dialysis catheter  Right  06/24/2013       Procedure: INSERTION OF DIALYSIS CATHETER: Ultrasound guided;  Surgeon: Serafina Mitchell, MD;  Location: [place];  Service: Vascular;  Laterality: Right;   .  Loop recorder implant    07-21-13       MDT LinQ implanted by Dr Lovena Le for cryptogenic stroke   .  Tee without cardioversion  N/A  07/21/2013       Procedure: TRANSESOPHAGEAL ECHOCARDIOGRAM (TEE);  Surgeon: Dorothy Spark, MD;  Location: [facility] ENDOS[facility];  Service: Cardiovascular;  Laterality: N/A;    Family History   Problem  Relation  Age of Onset   .  Anesthesia problems  Neg Hx     .  Hypotension  Neg Hx     .  Malignant hyperthermia  Neg Hx     .  Pseudochol deficiency  Neg Hx     .  Cancer  Sister     .  Cancer  Brother      Social History: reports that she has never smoked. She has never used smokeless tobacco. She reports that she does not drink alcohol or use illicit drugs. Allergies:  Allergies   Allergen  Reactions   .  Ambien [Zolpidem]  Other (See Comments)       Hallucinations    .  Reglan [Metoclopramide]  Other (See Comments)       Hallucinations     Medications Prior to Admission   Medication  Sig  Dispense  Refill   .  acetaminophen (TYLENOL) 325 MG tablet  Take 650 mg by mouth every 6 (six) hours as needed (pain).         Marland Kitchen  aspirin 325 MG tablet  Take 325 mg by mouth daily.         .  calcium carbonate (TUMS - DOSED IN MG ELEMENTAL  CALCIUM) 500 MG chewable tablet  Chew 1 tablet by mouth 3 (three) times daily with meals.         .  Cyanocobalamin (VITAMIN B-12 IJ)  Inject 1 application as directed every 30 (thirty) days.         .  insulin NPH Human (HUMULIN N,NOVOLIN N) 100 UNIT/ML injection  Inject 10 Units into the skin 2 (two) times daily at 8 am and 10 pm.   10 mL   1   .  labetalol (NORMODYNE) 200 MG tablet  Take 200 mg by mouth 2 (two) times daily.         .  multivitamin (RENA-VIT) TABS tablet  Take 1 tablet by mouth at bedtime.   30 tablet   0   .  pantoprazole (PROTONIX) 40 MG tablet  Take 1 tablet (40 mg total) by mouth daily with supper.   30 tablet   0   .  sodium chloride 0.9 % SOLN 100 mL with ferric gluconate 12.5 MG/ML SOLN 125 mg  Inject 125 mg into the vein every Monday, Wednesday, and Friday with hemodialysis.            Home: Home Living Family/patient expects to be discharged to:: Private residence Living Arrangements: Children Available Help at Discharge: Family;Available 24 hours/day Type of Home: House Home Access: Stairs to enter [employer] of Steps: 1 Entrance Stairs-Rails: None Home Layout: One level Home Equipment: Walker - 4 wheels;Bedside commode;Tub bench    Functional History:   Functional Status:   Mobility: Ambulation/Gait Ambulation Distance (Feet): 140 Feet General Gait Details: pt much improved mobility and eager for ambulation.     ADL: ADL Grooming: Wash/dry hands;Wash/dry face;Teeth care;Simulated;+1 Total assistance Upper Body Bathing: +1 Total assistance Lower Body Bathing: +1 Total assistance Upper Body Dressing: +1 Total assistance Lower Body Dressing: +1 Total assistance Toilet Transfer: +1 Total assistance ADL Comments: Pt with cognitive deficits affecting all aspects of ADLs. pt unable to sustain attention to task. pt with apraxic motor movements and difficulty with initiation. Pt rocking at eob in a setting rocking pattern. pt unable to sustain  bil LE on floor in static sitting. pt singing prior to entry to just sing.   Cognition: Cognition Overall Cognitive Status: Impaired/Different from baseline Orientation Level: Oriented to person;Disoriented to time;Oriented to place;Oriented to situation Cognition Arousal/Alertness: Awake/alert Behavior During Therapy: Flat affect Overall Cognitive Status: Impaired/Different from baseline Area of Impairment: Orientation;Attention;Memory;Following commands;Safety/judgement;Awareness;Problem solving Orientation Level:  Disoriented to;Person;Place;Time;Situation Current Attention Level: Focused Memory: Decreased recall of precautions;Decreased short-term memory Following Commands: Follows one step commands inconsistently Safety/Judgement: Decreased awareness of safety;Decreased awareness of deficits Awareness: Emergent;Anticipatory;Intellectual Problem Solving: Slow processing;Decreased initiation;Difficulty sequencing;Requires verbal cues;Requires tactile cues General Comments: Pt unable to verbalize who visitors in the room are and calls them by incorrect names. x2 daughters present. pt unable to give location ( names [place] and shocked when educated [place]) Pt unable to give full DOB. Pt reports with very delayed response [birth year]. Pt unable to provided year for DOB with cueing provided. Pt able to recall information stated after 15 seconds, 30 seconds but delayed initiation to respond to command. Pt repeating back to therapist commands with no action to command.    Physical Exam: Blood pressure 161/82, pulse 96, temperature 98.7 F (37.1 C), temperature source Oral, resp. rate 20, height 5\' 6"  (1.676 m), weight 74.8 kg (164 lb 14.5 oz), SpO2 99.00%. Physical Exam HENT:   Head: Normocephalic.   Eyes: EOM are normal.   Neck: Normal range of motion. Neck supple. No thyromegaly present.   Cardiovascular: Normal rate and regular rhythm.   Respiratory: Effort normal and breath  sounds normal. No respiratory distress.   GI: Soft. Bowel sounds are normal. She exhibits no distension.  Neurological: She is alert.  Patient was able to provide her name, age and date of birth. She followed simple commands. Ataxic LE mod, UE mild. Mild weakness 5/5 RUE, 4/5 LUE , 4/5 in LE's   Skin: Skin is warm and dry.  Psychiatric: She has a normal mood and affect. Her behavior is normal    Results for orders placed during the hospital encounter of 08/19/13 (from the past 48 hour(s))   GLUCOSE, CAPILLARY     Status: Abnormal     Collection Time      08/21/13 11:57 AM       Result  Value  Ref Range     Glucose-Capillary  105 (*)  70 - 99 mg/dL   GLUCOSE, CAPILLARY     Status: Abnormal     Collection Time      08/21/13  4:12 PM       Result  Value  Ref Range     Glucose-Capillary  193 (*)  70 - 99 mg/dL   GLUCOSE, CAPILLARY     Status: Abnormal     Collection Time      08/21/13 10:57 PM       Result  Value  Ref Range     Glucose-Capillary  221 (*)  70 - 99 mg/dL     Comment 1  Documented in Chart        Comment 2  Notify RN      GLUCOSE, CAPILLARY     Status: Abnormal     Collection Time      08/22/13  6:46 AM       Result  Value  Ref Range     Glucose-Capillary  140 (*)  70 - 99 mg/dL     Comment 1  Documented in Chart        Comment 2  Notify RN      GLUCOSE, CAPILLARY     Status: Abnormal     Collection Time      08/22/13 12:28 PM       Result  Value  Ref Range     Glucose-Capillary  250 (*)  70 - 99 mg/dL     Comment 1  Documented in Chart        Comment 2  Notify RN      GLUCOSE, CAPILLARY     Status: Abnormal     Collection Time      08/22/13  4:55 PM       Result  Value  Ref Range     Glucose-Capillary  141 (*)  70 - 99 mg/dL   GLUCOSE, CAPILLARY     Status: Abnormal     Collection Time      08/22/13 11:16 PM       Result  Value  Ref Range     Glucose-Capillary  226 (*)  70 - 99 mg/dL     Comment 1  Notify RN       No results found.   Post Admission  Physician Evaluation: Functional deficits secondary  to Bilateral cerebral and cerebellar infarcts embolic. Patient is admitted to receive collaborative, interdisciplinary care between the physiatrist, rehab nursing staff, and therapy team. Patient's level of medical complexity and substantial therapy needs in context of that medical necessity cannot be provided at a lesser intensity of care such as a SNF. Patient has experienced substantial functional loss from his/her baseline which was documented above under the "Functional History" and "Functional Status" headings.  Judging by the patient's diagnosis, physical exam, and functional history, the patient has potential for functional progress which will result in measurable gains while on inpatient rehab.  These gains will be of substantial and practical use upon discharge  in facilitating mobility and self-care at the household level. Physiatrist will provide 24 hour management of medical needs as well as oversight of the therapy plan/treatment and provide guidance as appropriate regarding the interaction of the two. 24 hour rehab nursing will assist with bladder management, bowel management, safety, skin/wound care, disease management, medication administration and patient education  and help integrate therapy concepts, techniques,education, etc. PT will assess and treat for/with: pre gait, gait training, endurance , safety, equipment, neuromuscular re education.   Goals are: Sup/modI. OT will assess and treat for/with: ADLs, Cognitive perceptual skills, Neuromuscular re education, safety, endurance, equipment.   Goals are: Sup/mod I. SLP will assess and treat for/with: Assess cognition.  Goals are: mod I med management. Case Management and Social Worker will assess and treat for psychological issues and discharge planning. Team conference will be held weekly to assess progress toward goals and to determine barriers to discharge. Patient will receive  at least 3 hours of therapy per day at least 5 days per week. ELOS: 7-10 days        Prognosis:  excellent     Medical Problem List and Plan: 1. Bi cerebral infarcts 2. DVT Prophylaxis/Anticoagulation: Subcutaneous heparin. Monitor platelet counts and any signs of bleeding 3. Pain Management: Tylenol as needed 4. Neuropsych: This patient is capable of making decisions on her own behalf. 5. Dysphagia. Dysphagia 2 nectar liquids. Followup speech therapy 6. End stage renal disease with hemodialysis. Followup per renal services 7. Diabetes mellitus with peripheral neuropathy. Hemoglobin A1c 7.1. Insulin NPH 5 units twice a day. Check blood sugars a.c. and at bedtime 8. Hypertension. No current antihypertensive medication. Monitor with increased mobility. Patient on labetalol 200 mg twice a day prior to admission 9. Chronic anemia. Continue Aranesp. Followup labs with dialysis  Charlett Blake M.D. [facility] FAAPM&R (Sports Med, Neuromuscular Med) Diplomate Am Board of Electrodiagnostic Med  08/23/2013

## 2013-08-24 NOTE — Progress Notes (Signed)
Patient information reviewed and entered into eRehab system by Brewer Hitchman, RN, CRRN, PPS Coordinator.  Information including medical coding and functional independence measure will be reviewed and updated through discharge.     Per nursing patient was given "Data Collection Information Summary for Patients in Inpatient Rehabilitation Facilities with attached "Privacy Act Statement-Health Care Records" upon admission.  

## 2013-08-25 ENCOUNTER — Ambulatory Visit (HOSPITAL_COMMUNITY): Payer: Medicare Other

## 2013-08-25 ENCOUNTER — Inpatient Hospital Stay (HOSPITAL_COMMUNITY): Payer: Medicare Other

## 2013-08-25 ENCOUNTER — Inpatient Hospital Stay (HOSPITAL_COMMUNITY): Payer: Medicare Other | Admitting: Speech Pathology

## 2013-08-25 ENCOUNTER — Telehealth: Payer: Self-pay | Admitting: Vascular Surgery

## 2013-08-25 ENCOUNTER — Inpatient Hospital Stay (HOSPITAL_COMMUNITY): Payer: Medicare Other | Admitting: Occupational Therapy

## 2013-08-25 DIAGNOSIS — I6350 Cerebral infarction due to unspecified occlusion or stenosis of unspecified cerebral artery: Secondary | ICD-10-CM

## 2013-08-25 DIAGNOSIS — N184 Chronic kidney disease, stage 4 (severe): Secondary | ICD-10-CM

## 2013-08-25 DIAGNOSIS — E1142 Type 2 diabetes mellitus with diabetic polyneuropathy: Secondary | ICD-10-CM

## 2013-08-25 DIAGNOSIS — E1149 Type 2 diabetes mellitus with other diabetic neurological complication: Secondary | ICD-10-CM

## 2013-08-25 DIAGNOSIS — Z01810 Encounter for preprocedural cardiovascular examination: Secondary | ICD-10-CM

## 2013-08-25 DIAGNOSIS — I69991 Dysphagia following unspecified cerebrovascular disease: Secondary | ICD-10-CM

## 2013-08-25 DIAGNOSIS — I10 Essential (primary) hypertension: Secondary | ICD-10-CM

## 2013-08-25 DIAGNOSIS — N186 End stage renal disease: Secondary | ICD-10-CM

## 2013-08-25 LAB — CBC
HCT: 29 % — ABNORMAL LOW (ref 36.0–46.0)
Hemoglobin: 9.8 g/dL — ABNORMAL LOW (ref 12.0–15.0)
MCH: 29.2 pg (ref 26.0–34.0)
MCHC: 33.8 g/dL (ref 30.0–36.0)
MCV: 86.3 fL (ref 78.0–100.0)
Platelets: 192 10*3/uL (ref 150–400)
RBC: 3.36 MIL/uL — ABNORMAL LOW (ref 3.87–5.11)
RDW: 16.8 % — AB (ref 11.5–15.5)
WBC: 8.2 10*3/uL (ref 4.0–10.5)

## 2013-08-25 LAB — CULTURE, BLOOD (ROUTINE X 2)
Culture: NO GROWTH
Culture: NO GROWTH

## 2013-08-25 LAB — RENAL FUNCTION PANEL
ALBUMIN: 3.1 g/dL — AB (ref 3.5–5.2)
BUN: 36 mg/dL — AB (ref 6–23)
CALCIUM: 8.8 mg/dL (ref 8.4–10.5)
CO2: 25 mEq/L (ref 19–32)
CREATININE: 5.71 mg/dL — AB (ref 0.50–1.10)
Chloride: 93 mEq/L — ABNORMAL LOW (ref 96–112)
GFR calc Af Amer: 8 mL/min — ABNORMAL LOW (ref >90)
GFR calc non Af Amer: 7 mL/min — ABNORMAL LOW (ref >90)
GLUCOSE: 139 mg/dL — AB (ref 70–99)
PHOSPHORUS: 3.5 mg/dL (ref 2.3–4.6)
Potassium: 4.2 mEq/L (ref 3.7–5.3)
Sodium: 133 mEq/L — ABNORMAL LOW (ref 137–147)

## 2013-08-25 LAB — GLUCOSE, CAPILLARY
GLUCOSE-CAPILLARY: 132 mg/dL — AB (ref 70–99)
GLUCOSE-CAPILLARY: 228 mg/dL — AB (ref 70–99)
GLUCOSE-CAPILLARY: 98 mg/dL (ref 70–99)

## 2013-08-25 MED ORDER — DOXERCALCIFEROL 4 MCG/2ML IV SOLN
INTRAVENOUS | Status: AC
  Administered 2013-08-25: 22:00:00
  Filled 2013-08-25: qty 2

## 2013-08-25 NOTE — Progress Notes (Signed)
Speech Language Pathology Daily Session Note  Patient Details  Name: [name] MRN: [id number] Date of Birth: 07-07-1940  Today's Date: 08/25/2013 Time: 1130-1200 Time Calculation (min): 30 min  Short Term Goals: Week 1: SLP Short Term Goal 1 (Week 1): STG=LTG  Skilled Therapeutic Interventions: Skilled treatment session focused on addressing dysphagia goals.  SLP facilitated session with trial tray of upgraded textures, regular and thin as well as Supervision level verbal cues to utilize small bites and manage oral pocketing.   Patient demonstrated no overt s/s of aspiration and as a result, it is recommended to continue with diet advancement and full short-term supervision.   FIM:  Comprehension Comprehension Mode: Auditory Comprehension: 5-Understands complex 90% of the time/Cues < 10% of the time Expression Expression Mode: Verbal Expression: 5-Expresses complex 90% of the time/cues < 10% of the time Social Interaction Social Interaction: 6-Interacts appropriately with others with medication or extra time (anti-anxiety, antidepressant). Problem Solving Problem Solving: 5-Solves basic 90% of the time/requires cueing < 10% of the time Memory Memory: 4-Recognizes or recalls 75 - 89% of the time/requires cueing 10 - 24% of the time FIM - Eating Eating Activity: 5: Supervision/cues  Pain Pain Assessment Pain Assessment: No/denies pain  Therapy/Group: Individual Therapy  Carmelia Roller., [place] [id number]  [place] 08/25/2013, 12:32 PM

## 2013-08-25 NOTE — Progress Notes (Signed)
Occupational Therapy Session Note  Patient Details  Name: [name] MRN: [id number] Date of Birth: 14-Aug-1940  Today's Date: 08/25/2013 Time: [id number] Time Calculation (min): 55 min  Short Term Goals: Week 1:  OT Short Term Goal 1 (Week 1): STG = LTGs due to ELOS  Skilled Therapeutic Interventions/Progress Updates:    Engaged in ADL retraining with items set up on Rt side to encourage scanning to Rt visual field during self-care tasks of bathing and dressing.  Pt ambulated to sink with HHA and completed bathing at sit > stand level with min/steady assist when standing to wash perineal area.  Educated pt on sitting to wash BLE and when donning underwear and pants to increase safety at this time.  Had pt gather dirty clothes from floor and place in laundry bag with HHA while ambulating.  Pt donned TEDS and shoes with increased time.  Pt with difficulty donning shirt requiring verbal cues for orientation.  Completed visual task with pen and paper with pt reading instructions this time was able to correctly identify every word in instructions and was able to correctly complete tasks.  Pt required ~5 mins to cancel 47x's out of 300 letters, missing 13.  The majority of the ones she missed were in the upper Lt and Rt quadrant of the paper.  Pt with non linear scanning during task.  Also completed word find activity with pt able to correctly read and follow directions then only about to find 8/23 words in 5 minute time limit.  Discussed linear scanning to increase participation in visual task.  Therapy Documentation Precautions:  Precautions Precautions: Fall Precaution Comments: diaylsis port Rt chest, IV Rt forearm, restricted Lt UE Restrictions Weight Bearing Restrictions: No General:   Vital Signs: Therapy Vitals Temp: 98.9 F (37.2 C) Temp src: Oral Pulse Rate: 88 BP: 185/95 mmHg Patient Position, if appropriate: Sitting Pain: Pain Assessment Pain Assessment: No/denies  pain Pain Score: 0-No pain  See FIM for current functional status  Therapy/Group: Individual Therapy  Simonne Come 08/25/2013, 9:04 AM

## 2013-08-25 NOTE — Progress Notes (Signed)
Right  Upper Extremity Vein Map    Cephalic  Segment Diameter Depth Comment  1. Axilla mm mm   2. Mid upper arm 4.50mm mm   3. Above AC 31mm mm   4. In AC 3.73mm mm   5. Below AC 4.64mm mm   6. Mid forearm 3.22mm mm   7. Wrist 3.72mm mm    mm mm    mm mm    mm mm     Left Upper Extremity Vein Map    Cephalic  Segment Diameter Depth Comment  1. Axilla 2.4mm mm   2. Mid upper arm 1.62mm mm   3. Above AC 2.7mm mm   4. In AC 3.61mm mm   5. Below AC 4.64mm mm branch  6. Mid forearm 2.7mm mm   7. Wrist 2.38mm mm    mm mm    mm mm    mm mm    Basilic  Segment Diameter Depth Comment  1. Axilla 8.47mm 27mm   2. Mid upper arm 3.62mm 58mm   3. Above AC 3.28mm 55mm   4. In [facility] 3.23mm 9.51mm   5. Below AC 3.60mm 6.56mm   6. Mid forearm 2.51mm 6.45mm   7. Wrist 106mm 5.25mm    mm mm    mm mm    mm mm

## 2013-08-25 NOTE — Progress Notes (Signed)
Physical Therapy Session Note  Patient Details  Name: [name] MRN: [id number] Date of Birth: 09/30/1940  Today's Date: 08/25/2013 Time: 1300-1400 Time Calculation (min): 60 min  Short Term Goals: Week 1:  PT Short Term Goal 1 (Week 1): = LTG secondary to short LOS  Pt participated in walking group with focus on obstacle negotiation to simulate home and community environment, gait to/from day room to patient room for endurance and strengthening, and dynamic standing balance/tolerance activities to throw/catch ball and play horseshoes. Pt required overall close S to steady A with mobility and verbal cues for safety with RW during mobility. End of session, assisted pt with toileting with close S/steady A and returned to bed to wait for transportation for HD.   Therapy Documentation Precautions:  Precautions Precautions: Fall Precaution Comments: diaylsis port Rt chest, IV Rt forearm, restricted Lt UE Restrictions Weight Bearing Restrictions: No  Pain: Denies pain.  See FIM for current functional status  Therapy/Group: Group Therapy  Canary Brim [facility] 08/25/2013, 2:09 PM

## 2013-08-25 NOTE — Progress Notes (Signed)
Subjective/Complaints: 73 y.o. 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 Admitted 08/19/2013 with right side weakness as well as altered mental status. MRI of the brain showed scattered acute infarcts throughout both cerebral and cerebellar hemispheres. Followup neurology services aspirin discontinued placed on Plavix. A loop recorder in place from previous admission. No further stroke workup indicated. Hemodialysis ongoing as per renal services. Subcutaneous heparin for DVT prophylaxis. Currently maintained on dysphagia 2 nectar thick liquid diet  No problems overnite Discussed D/C date with pt and daughter  Review of Systems - Negative except weakness  Objective: Vital Signs: Blood pressure 185/95, pulse 88, temperature 98.9 F (37.2 C), temperature source Oral, resp. rate 18, weight 72.1 kg (158 lb 15.2 oz), SpO2 100.00%. No results found. Results for orders placed during the hospital encounter of 08/23/13 (from the past 72 hour(s))  GLUCOSE, CAPILLARY     Status: Abnormal   Collection Time    08/23/13  9:53 PM      Result Value Ref Range   Glucose-Capillary 356 (*) 70 - 99 mg/dL  GLUCOSE, CAPILLARY     Status: Abnormal   Collection Time    08/24/13  7:35 AM      Result Value Ref Range   Glucose-Capillary 139 (*) 70 - 99 mg/dL  GLUCOSE, CAPILLARY     Status: Abnormal   Collection Time    08/24/13 11:01 AM      Result Value Ref Range   Glucose-Capillary 269 (*) 70 - 99 mg/dL  GLUCOSE, CAPILLARY     Status: Abnormal   Collection Time    08/24/13  5:09 PM      Result Value Ref Range   Glucose-Capillary 126 (*) 70 - 99 mg/dL   Comment 1 Notify RN    GLUCOSE, CAPILLARY      Status: Abnormal   Collection Time    08/24/13  9:32 PM      Result Value Ref Range   Glucose-Capillary 204 (*) 70 - 99 mg/dL      Physical Exam  HENT:  Head: Normocephalic.  Eyes: EOM are normal.  Neck: Normal range of motion. Neck supple. No thyromegaly present.  Cardiovascular: Normal rate and regular rhythm.  Respiratory: Effort normal and breath sounds normal. No respiratory distress.  GI: Soft. Bowel sounds are normal. She exhibits no distension.  Neurological: She is alert.  Patient was able to provide her name, age and date of birth. She followed simple commands. Ataxic LE mod, UE mild. Mild weakness 5/5 RUE, 4/5 LUE , 4/5 in LE's  Skin: Skin is warm and dry.  Psychiatric: She has a normal mood and affect. Her behavior is normal   Assessment/Plan: 1. Functional deficits secondary to bicerebral and cerebellar embolic infarcts which require 3+ hours per day of interdisciplinary therapy in a comprehensive inpatient rehab setting. Physiatrist is providing close team supervision and 24 hour management of active medical problems listed below. Physiatrist and rehab team continue to assess barriers to discharge/monitor patient progress toward functional and medical goals. FIM: FIM - Bathing Bathing Steps Patient Completed: Chest;Right Arm;Left Arm;Abdomen;Front perineal area;Buttocks;Right upper leg;Left upper leg;Right lower leg (including foot);Left lower leg (including foot) Bathing: 4: Steadying assist  FIM - Upper Body Dressing/Undressing Upper body dressing/undressing steps patient completed: Thread/unthread  left sleeve of pullover shirt/dress;Put head through opening of pull over shirt/dress;Pull shirt over trunk Upper body dressing/undressing: 4: Min-Patient completed 75 plus % of tasks FIM - Lower Body Dressing/Undressing Lower body dressing/undressing steps patient completed: Thread/unthread right underwear leg;Thread/unthread left underwear leg;Pull underwear  up/down;Thread/unthread right pants leg;Thread/unthread left pants leg;Pull pants up/down;Don/Doff right shoe;Don/Doff left shoe;Fasten/unfasten right shoe;Fasten/unfasten left shoe Lower body dressing/undressing: 4: Steadying Assist        FIM - Control and instrumentation engineer Devices: Arm rests Bed/Chair Transfer: 4: Bed > Chair or W/C: Min A (steadying Pt. > 75%);4: Chair or W/C > Bed: Min A (steadying Pt. > 75%);6: Supine > Sit: No assist;6: Sit > Supine: No assist  FIM - Locomotion: Wheelchair Distance: 150 in controlled environment initially with bilat LE propulsion and then switching to UE propulsion with verbal and visual cues for sequencing and attention to RUE  Locomotion: Wheelchair: 2: Travels 150 ft or more: maneuvers on rugs and over door sills with maximal assistance (Pt: 25 - 49%) FIM - Locomotion: Ambulation Ambulation/Gait Assistance: 4: Min assist Locomotion: Ambulation: 4: Travels 150 ft or more with minimal assistance (Pt.>75%)  Comprehension Comprehension Mode: Auditory Comprehension: 5-Follows basic conversation/direction: With extra time/assistive device  Expression Expression Mode: Verbal Expression: 5-Expresses basic needs/ideas: With no assist  Social Interaction Social Interaction: 5-Interacts appropriately 90% of the time - Needs monitoring or encouragement for participation or interaction.  Problem Solving Problem Solving: 5-Solves basic 90% of the time/requires cueing < 10% of the time  Memory Memory: 3-Recognizes or recalls 50 - 74% of the time/requires cueing 25 - 49% of the time  Medical Problem List and Plan:  1. Bi cerebral infarcts  2. DVT Prophylaxis/Anticoagulation: Subcutaneous heparin. Monitor platelet counts and any signs of bleeding  3. Pain Management: Tylenol as needed  4. Neuropsych: This patient is capable of making decisions on her own behalf.  5. Dysphagia. Dysphagia 2 nectar liquids. Followup speech therapy  6.  End stage renal disease with hemodialysis. Followup per renal services  7. Diabetes mellitus with peripheral neuropathy. Hemoglobin A1c 7.1. Insulin NPH 5 units twice a day. Check blood sugars a.c. and at bedtime  8. Hypertension. No current antihypertensive medication. Monitor with increased mobility. Patient on labetalol 200 mg twice a day prior to admission restart 9. Chronic anemia. Continue Aranesp. Followup labs with dialysis   LOS (Days) 2 A FACE TO FACE EVALUATION WAS PERFORMED  [name] E 08/25/2013, 7:32 AM

## 2013-08-25 NOTE — Consult Note (Signed)
Vascular Surgery Consultation  Reason for Consult: Needs vascular access  HPI: [name] is a 73 y.o. female who presents for evaluation of vascular access. Patient is right-handed. She has been on hemodialysis since December 2014. She currently has a tunneled dialysis catheter in the right IJ .   she was admitted in mid February and again last week for strokes and is currently receiving rehabilitation-inpatient. Past Medical History  Diagnosis Date  . Hypertension   . Cataract   . Diabetes mellitus   . Chronic kidney disease   . Shortness of breath   . Anemia   . CVA (cerebral infarction)    Past Surgical History  Procedure Laterality Date  . No past surgeries    . Cataract extraction w/phaco  11/20/2011    Procedure: CATARACT EXTRACTION PHACO AND INTRAOCULAR LENS PLACEMENT (IOC);  Surgeon: Tonny Branch, MD;  Location: [facility];  Service: Ophthalmology;  Laterality: Right;  CDE 18.82  . Cataract extraction w/phaco Left 11/18/2012    Procedure: CATARACT EXTRACTION PHACO AND INTRAOCULAR LENS PLACEMENT (IOC);  Surgeon: Tonny Branch, MD;  Location: [facility];  Service: Ophthalmology;  Laterality: Left;  CDE: 18.97  . Colonoscopy N/A 02/09/2013    Procedure: COLONOSCOPY;  Surgeon: Rogene Houston, MD;  Location: [facility];  Service: Endoscopy;  Laterality: N/A;  305-moved to 220 Ann to notify pt  . Insertion of dialysis catheter Right 06/24/2013    Procedure: INSERTION OF DIALYSIS CATHETER: Ultrasound guided;  Surgeon: Serafina Mitchell, MD;  Location: [facility];  Service: Vascular;  Laterality: Right;  . 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 Tee without cardioversion N/A 07/21/2013    Procedure: TRANSESOPHAGEAL ECHOCARDIOGRAM (TEE);  Surgeon: Dorothy Spark, MD;  Location[facility];  Service: Cardiovascular;  Laterality: N/A;   History   Social History  . Marital Status: Widowed    Spouse Name: N/A    Number of Children: N/A  .  Years of Education: N/A   Social History Main Topics  . Smoking status: Never Smoker   . Smokeless tobacco: Never Used  . Alcohol Use: No  . Drug Use: No  . Sexual Activity: Not on file   Other Topics Concern  . Not on file   Social History Narrative  . No narrative on file   Family History  Problem Relation Age of Onset  . Anesthesia problems Neg Hx   . Hypotension Neg Hx   . Malignant hyperthermia Neg Hx   . Pseudochol deficiency Neg Hx   . Cancer Sister   . Cancer Brother    Allergies  Allergen Reactions  . Ambien [Zolpidem] Other (See Comments)    Hallucinations   . Reglan [Metoclopramide] Other (See Comments)    Hallucinations    Prior to Admission medications   Medication Sig Start Date End Date Taking? Authorizing Provider  acetaminophen (TYLENOL) 325 MG tablet Take 650 mg by mouth every 6 (six) hours as needed (pain).   Yes Historical Provider, MD  calcium carbonate (TUMS - DOSED IN MG ELEMENTAL CALCIUM) 500 MG chewable tablet Chew 1 tablet by mouth 3 (three) times daily with meals.   Yes Historical Provider, MD  clopidogrel (PLAVIX) 75 MG tablet Take 1 tablet (75 mg total) by mouth daily with breakfast. 08/23/13  Yes Geradine Girt, DO  darbepoetin (ARANESP) 60 MCG/0.3ML SOLN injection Inject 0.3 mLs (60 mcg total) into the vein every Tuesday with hemodialysis.  08/23/13  Yes Geradine Girt, DO  doxercalciferol (HECTOROL) 4 MCG/2ML injection Inject 0.5 mLs (1 mcg total) into the vein Every Tuesday,Thursday,and Saturday with dialysis. 08/23/13  Yes Geradine Girt, DO  erythromycin ethylsuccinate (EES) 200 MG/5ML suspension Take 5 mLs (200 mg total) by mouth 4 (four) times daily -  with meals and at bedtime. 08/23/13  Yes Geradine Girt, DO  guaiFENesin (ROBITUSSIN) 100 MG/5ML SOLN Take 5 mLs (100 mg total) by mouth every 4 (four) hours as needed for cough or to loosen phlegm. 08/23/13  Yes Geradine Girt, DO  insulin aspart (NOVOLOG) 100 UNIT/ML injection Inject 0-9 Units  into the skin 3 (three) times daily with meals. 08/23/13  Yes Jessica U Vann, DO  insulin NPH Human (HUMULIN N,NOVOLIN N) 100 UNIT/ML injection Inject 5 Units into the skin 2 (two) times daily at 8 am and 10 pm. 08/23/13  Yes Jessica U Vann, DO  lidocaine, PF, (XYLOCAINE) 1 % SOLN injection Inject 5 mLs into the skin as needed (topical anesthesia for hemodialysis ifGEBAUERS is ineffective.). 08/23/13  Yes Geradine Girt, DO  lidocaine-prilocaine (EMLA) cream Apply 1 application topically as needed (topical anesthesia for hemodialysis if Gebauers and Lidocaine injection are ineffective.). 08/23/13  Yes Geradine Girt, DO  multivitamin (RENA-VIT) TABS tablet Take 1 tablet by mouth at bedtime. 07/04/13  Yes Cherene Altes, MD  Nutritional Supplements (FEEDING SUPPLEMENT, NEPRO CARB STEADY,) LIQD Take 237 mLs by mouth as needed (missed meal during dialysis.). 08/23/13  Yes Geradine Girt, DO  pantoprazole sodium (PROTONIX) 40 mg/20 mL PACK Take 20 mLs (40 mg total) by mouth daily. 08/23/13  Yes Geradine Girt, DO  pentafluoroprop-tetrafluoroeth (GEBAUERS) AERO Apply 1 application topically as needed (topical anesthesia for hemodialysis). 08/23/13  Yes Geradine Girt, DO  sodium chloride 0.9 % SOLN 100 mL with ferric gluconate 12.5 MG/ML SOLN 62.5 mg Inject 62.5 mg into the vein every Tuesday with hemodialysis. 08/23/13  Yes Geradine Girt, DO     Positive ROS:  see history of present illness  All other systems have been reviewed and were otherwise negative with the exception of those mentioned in the HPI and as above.  Physical Exam: Filed Vitals:   08/25/13 0615  BP: 185/95  Pulse: 88  Temp: 98.9 F (37.2 C)  Resp:     General: Alert, no acute distress HEENT: Normal for age Cardiovascular: Regular rate and rhythm. Carotid pulses 2+, no bruits audible Respiratory: Clear to auscultation. No cyanosis, no use of accessory musculature GI: No organomegaly, abdomen is soft and non-tender Skin: No  lesions in the area of chief complaint Neurologic: Sensation intact distally-fairly good motor strength bilaterally. Speech clear-in wheelchair-ambulates with help  Psychiatric: Patient is competent for consent with normal mood and affect Musculoskeletal: No obvious deformities Extremities:  3+ brachial 2+ radial pulse palpable bilaterally-antecubital vein on the left is visible but does not appear large  Labs reviewed:  Imaging reviewed:  bilateral upper extremity vein mapping pending   Assessment/Plan:   The patient needs hemodialysis-AV fistula creation depending on results of vein mapping Because of patients recent CVA x2 and current status on rehabilitation-do not think we should proceed with access at this time but a few weeks later we'll schedule this as an outpatient We'll see patient tomorrow and decide on procedure depending on vein mapping results and schedule for surgery in a few weeks   Tinnie Gens, MD 08/25/2013 11:06 AM

## 2013-08-25 NOTE — Telephone Encounter (Signed)
Message copied by Gena Fray on Thu Aug 25, 2013 11:11 AM ------      Message from: Peter Minium K      Created: Wed Aug 24, 2013  4:39 PM       Patient has appt on 08-30-13 with TFE and labs, these can be cancelled because she is in the hospital now. She is a consult today from Dr. Mercy Moore for access placement. :O)       ------

## 2013-08-25 NOTE — Progress Notes (Signed)
Physical Therapy Session Note  Patient Details  Name: [name] MRN: [id number] Date of Birth: 1940/08/24  Today's Date: 08/25/2013 Time: 0900-1000 Time Calculation (min): 60 min  Short Term Goals: Week 1:  PT Short Term Goal 1 (Week 1): = LTG secondary to short LOS  Skilled Therapeutic Interventions/Progress Updates:  Patient sitting edge of bed upon entering room. Patient performed stand pivot transfer bed to wheelchair with supervision. Patient performed bed mobility and w/c <>regular double bed with supervision. Patient ambulated with RW 150 feet x 2 with supervision. Patient participated in BERG balance test - see results below. Patient ambulated without assistive device 90 feet with close supervision to occasional steady assist. Patient working on head turns and changes of speed during ambulation. Patient ambulated up and down 10 steps with min steady assist. Patient ambulated with RW up and down ramp and curb with close supervision. Patient ambulated back to room - 200 feet with RW and supervision. Patient left in wheelchair with items in reach.  Therapy Documentation Precautions:  Precautions Precautions: Fall Precaution Comments: diaylsis port Rt chest, IV Rt forearm, restricted Lt UE Restrictions Weight Bearing Restrictions: No Pain: Pain Assessment Pain Assessment: No/denies pain Locomotion : Ambulation Ambulation/Gait Assistance: 5: Supervision   Balance: Standardized Balance Assessment Standardized Balance Assessment: Berg Balance Test Berg Balance Test Sit to Stand: Able to stand without using hands and stabilize independently Standing Unsupported: Able to stand safely 2 minutes Sitting with Back Unsupported but Feet Supported on Floor or Stool: Able to sit safely and securely 2 minutes Stand to Sit: Controls descent by using hands Transfers: Able to transfer safely, minor use of hands Standing Unsupported with Eyes Closed: Able to stand 10 seconds  safely Standing Ubsupported with Feet Together: Able to place feet together independently and stand 1 minute safely From Standing, Reach Forward with Outstretched Arm: Can reach confidently >25 cm (10") From Standing Position, Pick up Object from Floor: Able to pick up shoe safely and easily From Standing Position, Turn to Look Behind Over each Shoulder: Looks behind from both sides and weight shifts well Turn 360 Degrees: Able to turn 360 degrees safely one side only in 4 seconds or less Standing Unsupported, Alternately Place Feet on Step/Stool: Able to stand independently and safely and complete 8 steps in 20 seconds Standing Unsupported, One Foot in Front: Needs help to step but can hold 15 seconds Standing on One Leg: Tries to lift leg/unable to hold 3 seconds but remains standing independently Total Score: 48 Exercises: Patient performed [place] exercises standing at table x 12 reps each with supervision.  Other Treatments:    See FIM for current functional status  Therapy/Group: Individual Therapy  Sanjuana Letters 08/25/2013, 12:11 PM

## 2013-08-25 NOTE — Progress Notes (Signed)
S:  CO pain in RT from old IV site O:BP 185/95  Pulse 88  Temp(Src) 98.9 F (37.2 C) (Oral)  Resp 18  Wt 72.1 kg (158 lb 15.2 oz)  SpO2 100%  Intake/Output Summary (Last 24 hours) at 08/25/13 0834 Last data filed at 08/24/13 1800  Gross per 24 hour  Intake    600 ml  Output      0 ml  Net    600 ml   Weight change:  [id number] and alert CVS:RRR Resp:clear Abd:+ BS NTND Ext:no edema  No cords palpable in Rt arm NEURO:CNI Ox3 no asterixis.   Rt permcath   . calcium carbonate  1 tablet Oral TID WC  . clopidogrel  75 mg Oral Q breakfast  . [START ON 08/30/2013] darbepoetin (ARANESP) injection - DIALYSIS  60 mcg Intravenous Q Tue-HD  . doxercalciferol  1 mcg Intravenous Q T,Th,Sa-HD  . erythromycin ethylsuccinate  200 mg Oral TID WC & HS  . [START ON 08/30/2013] ferric gluconate (FERRLECIT/NULECIT) IV  62.5 mg Intravenous Q Tue-HD  . heparin  5,000 Units Subcutaneous 3 times per day  . insulin aspart  0-9 Units Subcutaneous TID WC  . insulin NPH Human  5 Units Subcutaneous BID AC & HS  . labetalol  200 mg Oral BID  . multivitamin  1 tablet Oral QHS  . pantoprazole sodium  40 mg Oral Daily   No results found. BMET    Component Value Date/Time   NA 139 08/23/2013 1115   K 5.1 08/23/2013 1115   CL 99 08/23/2013 1115   CO2 27 08/23/2013 1115   GLUCOSE 209* 08/23/2013 1115   BUN 43* 08/23/2013 1115   CREATININE 6.25* 08/23/2013 1115   CALCIUM 8.3* 08/23/2013 1115   GFRNONAA 6* 08/23/2013 1115   GFRAA 7* 08/23/2013 1115   CBC    Component Value Date/Time   WBC 10.3 08/23/2013 1114   RBC 3.05* 08/23/2013 1114   HGB 8.8* 08/23/2013 1114   HCT 26.2* 08/23/2013 1114   PLT 201 08/23/2013 1114   MCV 85.9 08/23/2013 1114   MCH 28.9 08/23/2013 1114   MCHC 33.6 08/23/2013 1114   RDW 16.6* 08/23/2013 1114   LYMPHSABS 1.4 07/20/2013 0910   MONOABS 1.7* 07/20/2013 0910   EOSABS 0.0 07/20/2013 0910   BASOSABS 0.0 07/20/2013 0910     Assessment: 1. Acute stroke, ? watershed from hypotension 2.   ESRD 3. 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 Anemia on aranesp 5. HTN, BP variable but I think that will have to be acceptable to all in order to prevent hypotension. 6. DM  Plan: 1.HD today.  2. I spoke with [facility], I was told someone would see her while she is here    Artur Winningham T

## 2013-08-25 NOTE — Progress Notes (Signed)
Patient's blood pressure 197/94 at HS and 185/95 this AM.  Patient does not have any symptoms.  Patient was given labetalol at HS.  Linna Hoff was notified, no new order at this time.  Will continue to monitor.

## 2013-08-26 ENCOUNTER — Inpatient Hospital Stay (HOSPITAL_COMMUNITY): Payer: Medicare Other | Admitting: Speech Pathology

## 2013-08-26 ENCOUNTER — Inpatient Hospital Stay (HOSPITAL_COMMUNITY): Payer: Medicare Other | Admitting: Physical Therapy

## 2013-08-26 ENCOUNTER — Inpatient Hospital Stay (HOSPITAL_COMMUNITY): Payer: Medicare Other | Admitting: Occupational Therapy

## 2013-08-26 ENCOUNTER — Encounter (HOSPITAL_COMMUNITY)
Admission: RE | Disposition: A | Discharge status: Home Health | Payer: Self-pay | Source: Intra-hospital | Attending: Physical Medicine & Rehabilitation

## 2013-08-26 LAB — GLUCOSE, CAPILLARY
GLUCOSE-CAPILLARY: 225 mg/dL — AB (ref 70–99)
Glucose-Capillary: 135 mg/dL — ABNORMAL HIGH (ref 70–99)
Glucose-Capillary: 255 mg/dL — ABNORMAL HIGH (ref 70–99)
Glucose-Capillary: 198 mg/dL — ABNORMAL HIGH (ref 70–99)

## 2013-08-26 SURGERY — ARTERIOVENOUS (AV) FISTULA CREATION
Anesthesia: Monitor Anesthesia Care | Site: Arm Lower | Laterality: Left

## 2013-08-26 MED ORDER — ERYTHROMYCIN BASE 250 MG PO TBEC
250.0000 mg | DELAYED_RELEASE_TABLET | Freq: Three times a day (TID) | ORAL | Status: DC
  Filled 2013-08-26: qty 1

## 2013-08-26 MED ORDER — ERYTHROMYCIN BASE 250 MG PO TBEC
250.0000 mg | DELAYED_RELEASE_TABLET | Freq: Three times a day (TID) | ORAL | Status: DC
  Administered 2013-08-26 – 2013-08-30 (×15): 250 mg via ORAL
  Filled 2013-08-26 (×19): qty 1

## 2013-08-26 NOTE — IPOC Note (Signed)
Overall Plan of Care [facility]) Patient Details Name: [name] MRN: [id number] DOB: 07/14/1940  Admitting Diagnosis: ESRD End Stage Renal Disease  Hospital Problems: Active Problems:   CVA (cerebral infarction)     Functional Problem List: Nursing Bladder;Bowel;Endurance;Pain;Skin Integrity  PT Balance;Motor;Perception  OT Balance;Motor  SLP Cognition;Nutrition;Linguistic  TR         Basic ADL's: OT Bathing;Dressing;Toileting     Advanced  ADL's: OT Simple Meal Preparation     Transfers: PT Bed to Chair;Car;Furniture;Floor  OT Toilet;Tub/Shower     Locomotion: PT Ambulation;Stairs     Additional Impairments: OT Fuctional Use of Upper Extremity  SLP Swallowing;Social Cognition;Communication comprehension Problem Solving;Memory;Awareness  TR      Anticipated Outcomes Item Anticipated Outcome  Self Feeding mod I  Swallowing  Mod I    Basic self-care  supervision  Toileting  mod I   Bathroom Transfers supervision  Bowel/Bladder  Pt will be continent of bowel and bladder with mod assist   Transfers  mod I  Locomotion  supervision  Communication  Supervision   Cognition  Supervision   Pain  pain will be at a level of 3 or less on a scale of 0-10  Safety/Judgment  Pt will remain free from falls and injury with min assist   Therapy Plan: PT Intensity: Minimum of 1-2 x/day ,45 to 90 minutes PT Frequency: 5 out of 7 days PT Duration Estimated Length of Stay: 7 days OT Intensity: Minimum of 1-2 x/day, 45 to 90 minutes OT Frequency: 5 out of 7 days OT Duration/Estimated Length of Stay: 7-10 days SLP Intensity: Minumum of 1-2 x/day, 30 to 90 minutes SLP Frequency: 5 out of 7 days SLP Duration/Estimated Length of Stay: 08/30/13       Team Interventions: Nursing Interventions Patient/Family Education;Bladder Management;Bowel Management;Pain Management;Medication Management;Skin Care/Wound Management  PT interventions Ambulation/gait  training;Balance/vestibular training;Cognitive remediation/compensation;Discharge planning;DME/adaptive equipment instruction;Functional mobility training;Neuromuscular re-education;Patient/family education;Stair training;Therapeutic Activities;Therapeutic Exercise;UE/LE Strength taining/ROM;UE/LE Coordination activities;Visual/perceptual remediation/compensation  OT Interventions Balance/vestibular training;Discharge planning;DME/adaptive equipment instruction;Functional mobility training;Neuromuscular re-education;Patient/family education;Psychosocial support;Self Care/advanced ADL retraining;Therapeutic Activities;Therapeutic Exercise;UE/LE Strength taining/ROM;UE/LE Coordination activities  SLP Interventions Cognitive remediation/compensation;Cueing hierarchy;Internal/external aids;Patient/family education;Speech/Language facilitation;Environmental controls;Functional tasks;Therapeutic Activities  TR Interventions    SW/CM Interventions Discharge Planning;Psychosocial Support;Patient/Family Education    Team Discharge Planning: Destination: PT-Home ,OT- Home , SLP-Home Projected Follow-up: PT-Outpatient PT;24 hour supervision/assistance, OT-  24 hour supervision/assistance, SLP-Other (comment) (TBD) Projected Equipment Needs: PT-None recommended by PT, OT- To be determined;Tub/shower bench, SLP-None recommended by SLP Equipment Details: PT-Has RW, OT-  Patient/family involved in discharge planning: PT- Patient;Family member/caregiver,  OT-Patient;Family member/caregiver, SLP-Patient;Family member/caregiver  MD ELOS: 7-10d Medical Rehab Prognosis:  Good Assessment: 73 y.o. 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 Admitted 08/19/2013 with right side weakness as well as altered mental status. MRI of the brain showed scattered acute infarcts throughout both cerebral and cerebellar hemispheres. Followup neurology services aspirin discontinued placed on Plavix. A loop recorder in place from previous admission. No further stroke workup indicated. Hemodialysis ongoing as per renal services. Subcutaneous heparin for DVT prophylaxis.  Now requiring 24/7 Rehab RN,MD, as well as CIR level PT, OT and SLP.  Treatment team will focus on ADLs and mobility with goals set at ModI/sup   See Team Conference Notes for  weekly updates to the plan of care

## 2013-08-26 NOTE — Progress Notes (Signed)
Occupational Therapy Session Note  Patient Details  Name: [name] MRN: [id number] Date of Birth: [birth year]  Today's Date: 08/26/2013 Time: 0730-0826 Time Calculation (min): 56 min  Short Term Goals: Week 1:  OT Short Term Goal 1 (Week 1): STG = LTGs due to ELOS  Skilled Therapeutic Interventions/Progress Updates:    Engaged in ADL retraining with items set up on Rt side to encourage scanning to Rt visual field during self-care tasks of bathing and dressing. Pt ambulated to sink with RW with increased safety issues with pt leaving RW out to side and stepping around it. Completed bathing at sit > stand level with close supervision when standing to wash perineal area. Had pt gather dirty clothes from floor and place in laundry bag with RW while ambulating. Pt donned TEDS and shoes with increased time. Pt with difficulty donning pants this session with threading BLE through Rt pant leg requiring verbal cues for orientation. Completed visual task from prior session with pt finishing word search activity utilizing more linear scanning pattern as discussed yesterday with pt having increased success with locating words.  Discussed how visual scanning and tracking carry over to functional tasks.  Therapy Documentation Precautions:  Precautions Precautions: Fall Precaution Comments: diaylsis port Rt chest, IV Rt forearm, restricted Lt UE Restrictions Weight Bearing Restrictions: No General:   Vital Signs: Therapy Vitals Temp: 98.6 F (37 C) Temp src: Oral Pulse Rate: 88 Resp: 18 BP: 175/94 mmHg Patient Position, if appropriate: Lying Oxygen Therapy SpO2: 97 % O2 Device: None (Room air) Pain:  Pt with no c/o pain  See FIM for current functional status  Therapy/Group: Individual Therapy  Simonne Come 08/26/2013, 8:50 AM

## 2013-08-26 NOTE — Progress Notes (Signed)
Patient ID: [name], female   DOB: 11-02-1940, 73 y.o.   MRN: [id number] Vein mapping reviewed-best cephalic vein is in the right upper extremity in the upper arm  Will schedule right brachial cephalic AV fistula in the next few weeks as an outpatient. Discussed this with patient and her [name] and they are in agreement

## 2013-08-26 NOTE — Progress Notes (Signed)
S:  No new CO O:BP 142/80  Pulse 88  Temp(Src) 98.6 F (37 C) (Oral)  Resp 18  Wt 71.8 kg (158 lb 4.6 oz)  SpO2 97%  Intake/Output Summary (Last 24 hours) at 08/26/13 1113 Last data filed at 08/26/13 0800  Gross per 24 hour  Intake    720 ml  Output   2060 ml  Net  -1340 ml   Weight change:  [id number] and alert CVS:RRR Resp:clear Abd:+ BS NTND Ext:no edema   NEURO:CNI Ox3 no asterixis.   Rt permcath   . calcium carbonate  1 tablet Oral TID WC  . clopidogrel  75 mg Oral Q breakfast  . [START ON 08/30/2013] darbepoetin (ARANESP) injection - DIALYSIS  60 mcg Intravenous Q Tue-HD  . doxercalciferol  1 mcg Intravenous Q T,Th,Sa-HD  . erythromycin ethylsuccinate  200 mg Oral TID WC & HS  . [START ON 08/30/2013] ferric gluconate (FERRLECIT/NULECIT) IV  62.5 mg Intravenous Q Tue-HD  . heparin  5,000 Units Subcutaneous 3 times per day  . insulin aspart  0-9 Units Subcutaneous TID WC  . insulin NPH Human  5 Units Subcutaneous BID AC & HS  . labetalol  200 mg Oral BID  . multivitamin  1 tablet Oral QHS  . pantoprazole sodium  40 mg Oral Daily   No results found. BMET    Component Value Date/Time   NA 133* 08/25/2013 1808   K 4.2 08/25/2013 1808   CL 93* 08/25/2013 1808   CO2 25 08/25/2013 1808   GLUCOSE 139* 08/25/2013 1808   BUN 36* 08/25/2013 1808   CREATININE 5.71* 08/25/2013 1808   CALCIUM 8.8 08/25/2013 1808   GFRNONAA 7* 08/25/2013 1808   GFRAA 8* 08/25/2013 1808   CBC    Component Value Date/Time   WBC 8.2 08/25/2013 1809   RBC 3.36* 08/25/2013 1809   HGB 9.8* 08/25/2013 1809   HCT 29.0* 08/25/2013 1809   PLT 192 08/25/2013 1809   MCV 86.3 08/25/2013 1809   MCH 29.2 08/25/2013 1809   MCHC 33.8 08/25/2013 1809   RDW 16.8* 08/25/2013 1809   LYMPHSABS 1.4 07/20/2013 0910   MONOABS 1.7* 07/20/2013 0910   EOSABS 0.0 07/20/2013 0910   BASOSABS 0.0 07/20/2013 0910     Assessment: 1. Acute stroke, ? watershed from hypotension 2.  ESRD 3. 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 Anemia on  aranesp 5. HTN, BP variable but I think that will have to be acceptable to all in order to prevent hypotension. 6. DM  Plan: 1.HD tomorrow then will get back on MWF schedule next.   Note plans for AVF to be placed as outpt in a few weeks.    Latrena Benegas T

## 2013-08-26 NOTE — Progress Notes (Signed)
Speech Language Pathology Daily Session Note  Patient Details  Name: [name] MRN: [id number] Date of Birth: 09/25/1940  Today's Date: 08/26/2013 Time: 1130-1200 Time Calculation (min): 30 min  Short Term Goals: Week 1: SLP Short Term Goal 1 (Week 1): STG=LTG  Skilled Therapeutic Interventions: Patient participated in group dysphagia treatment session.  SLP facilitated session skilled observation of diet tolerance of regular textures and thin liquids. Patient managed meal Mod I and demonstrated no overt s/s of aspiration and as a result it is recommend patient continues current diet with intermittent supervision.   FIM:  Comprehension Comprehension Mode: Auditory Comprehension: 5-Follows basic conversation/direction: With extra time/assistive device Expression Expression Mode: Verbal Expression: 5-Expresses complex 90% of the time/cues < 10% of the time Social Interaction Social Interaction: 5-Interacts appropriately 90% of the time - Needs monitoring or encouragement for participation or interaction. Problem Solving Problem Solving: 4-Solves basic 75 - 89% of the time/requires cueing 10 - 24% of the time Memory Memory: 4-Recognizes or recalls 75 - 89% of the time/requires cueing 10 - 24% of the time  Pain Pain Assessment Pain Assessment: No/denies pain  Therapy/Group: Group Therapy and Dysphagia Group  Carmelia Roller., CCC-SLP [id number]  [name] 08/26/2013, 12:38 PM

## 2013-08-26 NOTE — Progress Notes (Signed)
Physical Therapy Session Note  Patient Details  Name: [name] MRN: [id number] Date of Birth: 08-03-1940  Today's Date: 08/26/2013 Time: [id number] Time Calculation (min): 56 min  Short Term Goals: Week 1:  PT Short Term Goal 1 (Week 1): = LTG secondary to short LOS  Skilled Therapeutic Interventions/Progress Updates:   Pt in w/c in room visiting with family and friends.  Performed gait x 150' with RW and supervision with intermittent tactile, visual and verbal cues for straight navigation with RW; pt continues to drift to R and demonstrate decreased L step length and mild shuffling.  Performed stair negotiation training for one step onto porch with RW demonstrating sequence to pt first and then pt return demonstrated x 4 reps with min A and intermittent verbal cues for safe placement of RW on platform and attention to R wheel (off the side of platform).  Continued gait training with gait over compliant surface (mat with bean bags under it) to mimic grass in yard (walks across to get to porch) x 2 reps with verbal cues for safe lifting/placing of RW and full step length and safe foot clearance.  Gait training in hall way x 150' with RW and intermittent/various cues for L and R visual scanning and head turns and answering questions about objects in her environment for visual processing, scanning and dual task during gait.  Pt often had to stop to fully scan and process question.   Reviewed BERG score with pt, areas of impairments and falls risk.  Pt transitioned to floor from mat with min-mod A.  Reviewed with pt indications for calling EMS and then verbalized sequence for floor > furniture transfer.  Pt return demonstrated floor > furniture transfer from side > quadruped > tall kneeling with UE on mat > half kneeling > stand with min-mod A and extra time for sequencing.    Performed dynamic balance training standing on compliant mat while performing R and L foot taps to cones laterally, in  diagonal and in front of pt with focus on full lateral weight shift before coming into single limb stance for cone taps.  Returned to room to sit in w/c with daughter present.   Therapy Documentation Precautions:  Precautions Precautions: Fall Precaution Comments: diaylsis port Rt chest, IV Rt forearm, restricted Lt UE Restrictions Weight Bearing Restrictions: No Pain: Pain Assessment Pain Assessment: No/denies pain Locomotion : Ambulation Ambulation/Gait Assistance: 4: Min guard   See FIM for current functional status  Therapy/Group: Individual Therapy  Raylene Everts [facility] 08/26/2013, 3:29 PM

## 2013-08-26 NOTE — Progress Notes (Signed)
Subjective/Complaints: 73 y.o. 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 Admitted 08/19/2013 with right side weakness as well as altered mental status. MRI of the brain showed scattered acute infarcts throughout both cerebral and cerebellar hemispheres. Followup neurology services aspirin discontinued placed on Plavix. A loop recorder in place from previous admission. No further stroke workup indicated. Hemodialysis ongoing as per renal services. Subcutaneous heparin for DVT prophylaxis. Currently maintained on dysphagia 2 nectar thick liquid diet  Daughter has questions about BP Appreciate renal note, discussed concerns about hypotension associated with HD  Review of Systems - Negative except weakness  Objective: Vital Signs: Blood pressure 175/94, pulse 88, temperature 98.6 F (37 C), temperature source Oral, resp. rate 18, weight 71.8 kg (158 lb 4.6 oz), SpO2 97.00%. No results found. Results for orders placed during the hospital encounter of 08/23/13 (from the past 72 hour(s))  GLUCOSE, CAPILLARY     Status: Abnormal   Collection Time    08/23/13  9:53 PM      Result Value Ref Range   Glucose-Capillary 356 (*) 70 - 99 mg/dL  GLUCOSE, CAPILLARY     Status: Abnormal   Collection Time    08/24/13  7:35 AM      Result Value Ref Range   Glucose-Capillary 139 (*) 70 - 99 mg/dL  GLUCOSE, CAPILLARY     Status: Abnormal   Collection Time    08/24/13 11:01 AM      Result Value Ref Range   Glucose-Capillary 269 (*) 70 - 99 mg/dL  GLUCOSE, CAPILLARY     Status: Abnormal   Collection Time    08/24/13  5:09 PM      Result Value Ref Range   Glucose-Capillary 126 (*) 70 - 99 mg/dL   Comment 1 Notify RN    GLUCOSE, CAPILLARY     Status: Abnormal   Collection Time    08/24/13  9:32 PM      Result Value Ref Range   Glucose-Capillary 204 (*) 70 - 99 mg/dL  GLUCOSE, CAPILLARY     Status: Abnormal   Collection Time    08/25/13  7:36 AM      Result Value Ref Range   Glucose-Capillary 132 (*) 70 - 99 mg/dL   Comment 1 Notify RN    GLUCOSE, CAPILLARY     Status: Abnormal   Collection Time    08/25/13 11:31 AM      Result Value Ref Range   Glucose-Capillary 228 (*) 70 - 99 mg/dL   Comment 1 Notify RN    GLUCOSE, CAPILLARY     Status: None   Collection Time    08/25/13  4:54 PM      Result Value Ref Range   Glucose-Capillary 98  70 - 99 mg/dL   Comment 1 Notify RN    RENAL FUNCTION PANEL     Status: Abnormal   Collection Time    08/25/13  6:08 PM      Result Value Ref Range   Sodium 133 (*) 137 - 147 mEq/L   Potassium 4.2  3.7 - 5.3 mEq/L   Chloride 93 (*) 96 - 112 mEq/L   CO2 25  19 - 32 mEq/L   Glucose, Bld 139 (*) 70 - 99 mg/dL  BUN 36 (*) 6 - 23 mg/dL   Creatinine, Ser 5.71 (*) 0.50 - 1.10 mg/dL   Calcium 8.8  8.4 - 10.5 mg/dL   Phosphorus 3.5  2.3 - 4.6 mg/dL   Albumin 3.1 (*) 3.5 - 5.2 g/dL   GFR calc non Af Amer 7 (*) >90 mL/min   GFR calc Af Amer 8 (*) >90 mL/min   Comment: (NOTE)     The eGFR has been calculated using the CKD EPI equation.     This calculation has not been validated in all clinical situations.     eGFR's persistently <90 mL/min signify possible Chronic Kidney     Disease.  CBC     Status: Abnormal   Collection Time    08/25/13  6:09 PM      Result Value Ref Range   WBC 8.2  4.0 - 10.5 K/uL   RBC 3.36 (*) 3.87 - 5.11 MIL/uL   Hemoglobin 9.8 (*) 12.0 - 15.0 g/dL   HCT 29.0 (*) 36.0 - 46.0 %   MCV 86.3  78.0 - 100.0 fL   MCH 29.2  26.0 - 34.0 pg   MCHC 33.8  30.0 - 36.0 g/dL   RDW 16.8 (*) 11.5 - 15.5 %   Platelets 192  150 - 400 K/uL      Physical Exam  HENT:  Head: Normocephalic.  Eyes: EOM are normal.  Neck: Normal  range of motion. Neck supple. No thyromegaly present.  Cardiovascular: Normal rate and regular rhythm.  Respiratory: Effort normal and breath sounds normal. No respiratory distress.  GI: Soft. Bowel sounds are normal. She exhibits no distension.  Neurological: She is alert.  Patient was able to provide her name, age and date of birth. She followed simple commands. Ataxic LE mod, UE mild. Mild weakness 5/5 RUE, 4+/5 LUE , 4+/5 in LE's  Skin: Skin is warm and dry.  Psychiatric: She has a normal mood and affect. Her behavior is normal   Assessment/Plan: 1. Functional deficits secondary to bicerebral and cerebellar embolic infarcts which require 3+ hours per day of interdisciplinary therapy in a comprehensive inpatient rehab setting. Physiatrist is providing close team supervision and 24 hour management of active medical problems listed below. Physiatrist and rehab team continue to assess barriers to discharge/monitor patient progress toward functional and medical goals. FIM: FIM - Bathing Bathing Steps Patient Completed: Chest;Right Arm;Left Arm;Abdomen;Front perineal area;Buttocks;Right upper leg;Left upper leg;Right lower leg (including foot);Left lower leg (including foot) Bathing: 4: Steadying assist  FIM - Upper Body Dressing/Undressing Upper body dressing/undressing steps patient completed: Thread/unthread left sleeve of pullover shirt/dress;Put head through opening of pull over shirt/dress;Pull shirt over trunk;Thread/unthread right sleeve of pullover shirt/dresss Upper body dressing/undressing: 5: Set-up assist to: Obtain clothing/put away FIM - Lower Body Dressing/Undressing Lower body dressing/undressing steps patient completed: Thread/unthread right underwear leg;Thread/unthread left underwear leg;Pull underwear up/down;Thread/unthread right pants leg;Thread/unthread left pants leg;Pull pants up/down;Don/Doff right shoe;Don/Doff left shoe;Fasten/unfasten right shoe;Fasten/unfasten left  shoe Lower body dressing/undressing: 4: Steadying Assist     FIM - Radio producer Devices: Mining engineer Transfers: 5-To toilet/BSC: Supervision (verbal cues/safety issues);4-From toilet/BSC: Min A (steadying Pt. > 75%)  FIM - Bed/Chair Transfer Bed/Chair Transfer Assistive Devices: Arm rests Bed/Chair Transfer: 5: Sit > Supine: Supervision (verbal cues/safety issues);5: Supine > Sit: Supervision (verbal cues/safety issues);5: Bed > Chair or W/C: Supervision (verbal cues/safety issues);5: Chair or W/C > Bed: Supervision (verbal cues/safety issues)  FIM - Locomotion: Wheelchair Distance: 150 in controlled environment initially  with bilat LE propulsion and then switching to UE propulsion with verbal and visual cues for sequencing and attention to RUE  Locomotion: Wheelchair: 0: Activity did not occur FIM - Locomotion: Ambulation Locomotion: Ambulation Assistive Devices: Administrator Ambulation/Gait Assistance: 4: Min guard Locomotion: Ambulation: 4: Travels 150 ft or more with minimal assistance (Pt.>75%)  Comprehension Comprehension Mode: Auditory Comprehension: 5-Understands complex 90% of the time/Cues < 10% of the time  Expression Expression Mode: Verbal Expression: 5-Expresses complex 90% of the time/cues < 10% of the time  Social Interaction Social Interaction: 6-Interacts appropriately with others with medication or extra time (anti-anxiety, antidepressant).  Problem Solving Problem Solving: 5-Solves basic 90% of the time/requires cueing < 10% of the time  Memory Memory: 4-Recognizes or recalls 75 - 89% of the time/requires cueing 10 - 24% of the time  Medical Problem List and Plan:  1. Bi cerebral infarcts  2. DVT Prophylaxis/Anticoagulation: Subcutaneous heparin. Monitor platelet counts and any signs of bleeding  3. Pain Management: Tylenol as needed  4. Neuropsych: This patient is capable of making decisions on her own  behalf.  5. Dysphagia. Dysphagia 2 nectar liquids. Followup speech therapy  6. End stage renal disease with hemodialysis. Followup per renal services  7. Diabetes mellitus with peripheral neuropathy. Hemoglobin A1c 7.1. Insulin NPH 5 units twice a day. Check blood sugars a.c. and at bedtime  8. Hypertension. No current antihypertensive medication. Monitor with increased mobility. Patient on labetalol 200 mg twice a day prior to admission restart 9. Chronic anemia. Continue Aranesp. Followup labs with dialysis   LOS (Days) 3 A FACE TO FACE EVALUATION WAS PERFORMED  Detravion Tester E 08/26/2013, 6:41 AM

## 2013-08-26 NOTE — Progress Notes (Signed)
Speech Language Pathology Daily Session Note  Patient Details  Name: CHARRIE EIKEN MRN: [id number] Date of Birth: Dec 31, 1940  Today's Date: 08/26/2013 Time: [id number] Time Calculation (min): 43 min  Short Term Goals: Week 1: SLP Short Term Goal 1 (Week 1): STG=LTG  Skilled Therapeutic Interventions: Skilled treatment session focused on addressing cognition goals.  SLP facilitated session with new learning task.  Patient required Min-Mod question cues to recall procedures for task throughout session.  Patient also required Min verbal cues to self-monitor errors.  SLP educated patient and daughter regarding use of memory aids at home to assist with recall.   FIM:  Comprehension Comprehension Mode: Auditory Comprehension: 5-Follows basic conversation/direction: With extra time/assistive device Expression Expression Mode: Verbal Expression: 5-Expresses complex 90% of the time/cues < 10% of the time Social Interaction Social Interaction: 5-Interacts appropriately 90% of the time - Needs monitoring or encouragement for participation or interaction. Problem Solving Problem Solving: 4-Solves basic 75 - 89% of the time/requires cueing 10 - 24% of the time Memory Memory: 4-Recognizes or recalls 75 - 89% of the time/requires cueing 10 - 24% of the time  Pain Pain Assessment Pain Assessment: No/denies pain  Therapy/Group: Individual Therapy  Carmelia Roller., CCC-SLP [id number]  [place] 08/26/2013, 9:28 AM

## 2013-08-27 ENCOUNTER — Inpatient Hospital Stay (HOSPITAL_COMMUNITY): Payer: Medicare Other | Admitting: Occupational Therapy

## 2013-08-27 ENCOUNTER — Inpatient Hospital Stay (HOSPITAL_COMMUNITY): Payer: Medicare Other

## 2013-08-27 ENCOUNTER — Inpatient Hospital Stay (HOSPITAL_COMMUNITY): Payer: Medicare Other | Admitting: Speech Pathology

## 2013-08-27 DIAGNOSIS — N186 End stage renal disease: Secondary | ICD-10-CM

## 2013-08-27 DIAGNOSIS — I6350 Cerebral infarction due to unspecified occlusion or stenosis of unspecified cerebral artery: Secondary | ICD-10-CM

## 2013-08-27 DIAGNOSIS — I498 Other specified cardiac arrhythmias: Secondary | ICD-10-CM

## 2013-08-27 DIAGNOSIS — R609 Edema, unspecified: Secondary | ICD-10-CM

## 2013-08-27 LAB — GLUCOSE, CAPILLARY
GLUCOSE-CAPILLARY: 149 mg/dL — AB (ref 70–99)
GLUCOSE-CAPILLARY: 211 mg/dL — AB (ref 70–99)
Glucose-Capillary: 230 mg/dL — ABNORMAL HIGH (ref 70–99)
Glucose-Capillary: 126 mg/dL — ABNORMAL HIGH (ref 70–99)

## 2013-08-27 MED ORDER — CLONIDINE HCL 0.1 MG PO TABS
0.1000 mg | ORAL_TABLET | ORAL | Status: DC | PRN
  Administered 2013-08-27 – 2013-08-28 (×2): 0.1 mg via ORAL
  Filled 2013-08-27 (×2): qty 1

## 2013-08-27 MED ORDER — DOXERCALCIFEROL 4 MCG/2ML IV SOLN
INTRAVENOUS | Status: AC
  Administered 2013-08-27: 1 ug via INTRAVENOUS
  Filled 2013-08-27: qty 2

## 2013-08-27 NOTE — Progress Notes (Signed)
Occupational Therapy Session Notes  Patient Details  Name: [name] MRN: [id number] Date of Birth: 03/17/1941  Today's Date: 08/27/2013 Time: 1115-1155 and 100-200 Time Calculation (min): 40 min and 60 min  Short Term Goals: Week 1:  OT Short Term Goal 1 (Week 1): STG = LTGs due to ELOS  Skilled Therapeutic Interventions/Progress Updates:  1)  Patient resting in bed upon arrival with daughter at her side.  Engaged in self care retraining to include sponge bath, dress and groom.  Focused session on standing tolerance and dynamic balance, visual scanning and visual attention to right, problem solving & sequencing.  Patient stood for all of her bath, dress and groom except to wash her feet, don her socks, pants and shoes.  Patient also cleaned up after session.  2)  Patient seated in w/c upon arrival with daughter at her side.  Engaged in scavenger hunt at a RW level and cooking task to address visual scanning to right and superior visual fields, walker safety, activity tolerance, dynamic balance and memory.  Patient walked past 4/14 numbers that were located in her superior and or right visual fields and she walked past 3 of the numbers 3-5 times.  Patient chose to prepare Jiffy corn muffins from mix due to she has cooked them many times.  Patient required mod cues for reading the instructions on the box and for visual scanning to the right during task.  No unsafe behaviors noted as this OT assisted her to place pan in oven and this OT removed pan from oven.  Patient's daughter observed and stated that when patient has difficulty reading she usually has high blood sugar.  NT tested during session for 230 glucose reading.  Unsure visual deficits purely result of high glucose.  Therapy Documentation Precautions:  Precautions Precautions: Fall Precaution Comments: diaylsis port Rt chest, IV Rt forearm, restricted Lt UE Restrictions Weight Bearing Restrictions: No Pain: Denies pain in  both sessions ADL: See FIM for current functional status  Therapy/Group: Individual Therapy both sessions  [place], [place] 08/27/2013, 12:05 PM

## 2013-08-27 NOTE — Progress Notes (Signed)
S:  No new CO  Working with PT O:BP 176/84  Pulse 81  Temp(Src) 98.2 F (36.8 C) (Oral)  Resp 18  Wt 71.8 kg (158 lb 4.6 oz)  SpO2 95%  Intake/Output Summary (Last 24 hours) at 08/27/13 1117 Last data filed at 08/27/13 0800  Gross per 24 hour  Intake    720 ml  Output      0 ml  Net    720 ml   Weight change:  [id number] and alert CVS:RRR Resp:clear Abd:+ BS NTND Ext:no edema   NEURO:CNI Ox3 no asterixis.   Rt permcath   . calcium carbonate  1 tablet Oral TID WC  . clopidogrel  75 mg Oral Q breakfast  . [START ON 08/30/2013] darbepoetin (ARANESP) injection - DIALYSIS  60 mcg Intravenous Q Tue-HD  . doxercalciferol  1 mcg Intravenous Q T,Th,Sa-HD  . erythromycin  250 mg Oral TID WC & HS  . [START ON 08/30/2013] ferric gluconate (FERRLECIT/NULECIT) IV  62.5 mg Intravenous Q Tue-HD  . heparin  5,000 Units Subcutaneous 3 times per day  . insulin aspart  0-9 Units Subcutaneous TID WC  . insulin NPH Human  5 Units Subcutaneous BID AC & HS  . labetalol  200 mg Oral BID  . multivitamin  1 tablet Oral QHS  . pantoprazole sodium  40 mg Oral Daily   No results found. BMET    Component Value Date/Time   NA 133* 08/25/2013 1808   K 4.2 08/25/2013 1808   CL 93* 08/25/2013 1808   CO2 25 08/25/2013 1808   GLUCOSE 139* 08/25/2013 1808   BUN 36* 08/25/2013 1808   CREATININE 5.71* 08/25/2013 1808   CALCIUM 8.8 08/25/2013 1808   GFRNONAA 7* 08/25/2013 1808   GFRAA 8* 08/25/2013 1808   CBC    Component Value Date/Time   WBC 8.2 08/25/2013 1809   RBC 3.36* 08/25/2013 1809   HGB 9.8* 08/25/2013 1809   HCT 29.0* 08/25/2013 1809   PLT 192 08/25/2013 1809   MCV 86.3 08/25/2013 1809   MCH 29.2 08/25/2013 1809   MCHC 33.8 08/25/2013 1809   RDW 16.8* 08/25/2013 1809   LYMPHSABS 1.4 07/20/2013 0910   MONOABS 1.7* 07/20/2013 0910   EOSABS 0.0 07/20/2013 0910   BASOSABS 0.0 07/20/2013 0910     Assessment: 1. Acute stroke, ? watershed from hypotension 2.  ESRD 3. 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 Anemia on  aranesp 5. HTN, BP variable but I think that will have to be acceptable to all in order to prevent hypotension. 6. DM  Plan: 1.HD today 2. Clonidine PRN ordered   Grahm Etsitty T

## 2013-08-27 NOTE — Progress Notes (Signed)
Speech Language Pathology Daily Session Note  Patient Details  Name: [name] MRN: [id number] Date of Birth: 26-Sep-1940  Today's Date: 08/27/2013 Time: 1500-1530 Time Calculation (min): 30 min  Short Term Goals: Week 1: SLP Short Term Goal 1 (Week 1): STG=LTG  Skilled Therapeutic Interventions: Skilled treatment focused on cognitive goals. SLP facilitated session by providing Min verbal and question cues for utilization of external memory aids to increase recall of rules to a new learning task. Pt also required Max multimodal cues for problem solving with task. Pt independently requested to use the bathroom and required supervision verbal cues for safety with transfer. Continue with current plan of care.    FIM:  Comprehension Comprehension Mode: Auditory Comprehension: 5-Follows basic conversation/direction: With no assist Expression Expression Mode: Verbal Expression: 5-Expresses basic needs/ideas: With no assist Social Interaction Social Interaction: 5-Interacts appropriately 90% of the time - Needs monitoring or encouragement for participation or interaction. Problem Solving Problem Solving: 4-Solves basic 75 - 89% of the time/requires cueing 10 - 24% of the time Memory Memory: 4-Recognizes or recalls 75 - 89% of the time/requires cueing 10 - 24% of the time  Pain Pain Assessment Pain Assessment: No/denies pain  Therapy/Group: Individual Therapy  Joslyn Ramos 08/27/2013, 4:03 PM

## 2013-08-27 NOTE — Progress Notes (Signed)
Physical Therapy Session Note  Patient Details  Name: [name] MRN: [id number] Date of Birth: 04/30/1941  Today's Date: 08/27/2013 Time: 0800-0900 Time Calculation (min): 60 min  Short Term Goals: Week 1:  PT Short Term Goal 1 (Week 1): = LTG secondary to short LOS  Skilled Therapeutic Interventions/Progress Updates:  Pt received  Supine in bed with dtr present in room. Pt agreeable to therapy. Pt ambulated to gym (150 feet) with RW at supervision level, requiring cues to remain aware of obstacles and people in the hallway, mostly on her right side due to inattention, as well as cues to control gait speed. Pt demonstrating increased gait speed. Pt negotiated curb x4 with use of RW, demonstrating safe technique and sequencing, without verbal cues at supervision level. Pt completed scanning activity in gym, searching for cones, scanning right and left and retrieving cones. Pt also negotiated cones, ambulating around cones in figure eight pattern, with min cues for technique and safety. Pt ambulated increased distance in hallway 200-230 feet with RW, requiring continued cues for remain aware of obstacles. Pt required intermittent rest breaks due to fatigue. Pt completed seated and standing there ex before ambulating back to her room. There ex included, SAQ, seated marching x15 and standing heel raises, toe raises, marching, hip abd, hamstring curls and mini squats. Pt seated in w/c with dtr present in room at completion of session. Pt reports she is looking forward to discharge home on Tuesday.   Therapy Documentation Precautions:  Precautions Precautions: Fall Precaution Comments: diaylsis port Rt chest, IV Rt forearm, restricted Lt UE Restrictions Weight Bearing Restrictions: No Vital Signs: Therapy Vitals Temp: 98.2 F (36.8 C) Temp src: Oral Pulse Rate: 81 Resp: 18 BP: 176/84 mmHg Patient Position, if appropriate: Lying Oxygen Therapy SpO2: 95 % O2 Device: None (Room  air) Pain: Pain Assessment Pain Assessment: No/denies pain Pain Score: 0-No pain   See FIM for current functional status  Therapy/Group: Individual Therapy  Hulan Szumski R 08/27/2013, 8:56 AM

## 2013-08-28 DIAGNOSIS — E1149 Type 2 diabetes mellitus with other diabetic neurological complication: Secondary | ICD-10-CM

## 2013-08-28 DIAGNOSIS — G8190 Hemiplegia, unspecified affecting unspecified side: Secondary | ICD-10-CM

## 2013-08-28 LAB — GLUCOSE, CAPILLARY
GLUCOSE-CAPILLARY: 303 mg/dL — AB (ref 70–99)
GLUCOSE-CAPILLARY: 170 mg/dL — AB (ref 70–99)
Glucose-Capillary: 155 mg/dL — ABNORMAL HIGH (ref 70–99)
Glucose-Capillary: 182 mg/dL — ABNORMAL HIGH (ref 70–99)

## 2013-08-28 NOTE — Progress Notes (Signed)
[name] is a 73 y.o. female 05/17/1941 [id number]  Subjective: No new complaints. No new problems. Slept well. Feeling OK.  Objective: Vital signs in last 24 hours: Temp:  [98.1 F (36.7 C)-98.3 F (36.8 C)] 98.3 F (36.8 C) (03/15 0554) Pulse Rate:  [72-82] 72 (03/15 0554) Resp:  [17-20] 17 (03/15 0554) BP: (146-196)/(82-107) 150/82 mmHg (03/15 0554) SpO2:  [98 %-100 %] 99 % (03/15 0554) Weight:  [156 lb 15.5 oz (71.2 kg)] 156 lb 15.5 oz (71.2 kg) (03/14 2014) Weight change:  Last BM Date: 08/26/13  Intake/Output from previous day: 03/14 0701 - 03/15 0700 In: 480 [P.O.:480] Out: 2510  Last cbgs: CBG (last 3)   Recent Labs  08/27/13 1342 08/27/13 2042 08/28/13 0750  GLUCAP 230* 126* 155*     Physical Exam General: No apparent distress   HEENT: not dry Lungs: Normal effort. Lungs clear to auscultation, no crackles or wheezes. Cardiovascular: Regular rate and rhythm, no edema Abdomen: S/NT/ND; BS(+) Musculoskeletal:  unchanged Neurological: No new neurological deficits Wounds: N/A    Skin: clear  Aging changes Mental state: Alert, oriented, cooperative    Lab Results: BMET    Component Value Date/Time   NA 133* 08/25/2013 1808   K 4.2 08/25/2013 1808   CL 93* 08/25/2013 1808   CO2 25 08/25/2013 1808   GLUCOSE 139* 08/25/2013 1808   BUN 36* 08/25/2013 1808   CREATININE 5.71* 08/25/2013 1808   CALCIUM 8.8 08/25/2013 1808   GFRNONAA 7* 08/25/2013 1808   GFRAA 8* 08/25/2013 1808   CBC    Component Value Date/Time   WBC 8.2 08/25/2013 1809   RBC 3.36* 08/25/2013 1809   HGB 9.8* 08/25/2013 1809   HCT 29.0* 08/25/2013 1809   PLT 192 08/25/2013 1809   MCV 86.3 08/25/2013 1809   MCH 29.2 08/25/2013 1809   MCHC 33.8 08/25/2013 1809   RDW 16.8* 08/25/2013 1809   LYMPHSABS 1.4 07/20/2013 0910   MONOABS 1.7* 07/20/2013 0910   EOSABS 0.0 07/20/2013 0910   BASOSABS 0.0 07/20/2013 0910    Studies/Results: No results found.  Medications: I have reviewed the patient's  current medications.  Assessment/Plan:  1. Bi cerebral infarcts  2. DVT Prophylaxis/Anticoagulation: Subcutaneous heparin. Monitor platelet counts and any signs of bleeding  3. Pain Management: Tylenol as needed  4. Neuropsych: This patient is capable of making decisions on her own behalf.  5. Dysphagia. Dysphagia 2 nectar liquids. Followup speech therapy  6. End stage renal disease with hemodialysis. Followup per renal services  7. Diabetes mellitus with peripheral neuropathy. Hemoglobin A1c 7.1. Insulin NPH 5 units twice a day. Check blood sugars a.c. and at bedtime  8. Hypertension. No current antihypertensive medication. Monitor with increased mobility. Patient on labetalol 200 mg twice a day prior to admission restart  9. Chronic anemia. Continue Aranesp. Followup labs with dialysis  Cont Rx     Length of stay, days: 5  Walker Kehr , MD 08/28/2013, 9:01 AM

## 2013-08-28 NOTE — Progress Notes (Signed)
[name] is a 73 y.o. female September 19, 1940 [id number]  Subjective: No new complaints. No new problems. Slept well. Feeling OK.  Objective: Vital signs in last 24 hours: Temp:  [98.1 F (36.7 C)-98.3 F (36.8 C)] 98.3 F (36.8 C) (03/15 0554) Pulse Rate:  [72-82] 72 (03/15 0554) Resp:  [17-20] 17 (03/15 0554) BP: (146-196)/(82-107) 150/82 mmHg (03/15 0554) SpO2:  [98 %-100 %] 99 % (03/15 0554) Weight:  [156 lb 15.5 oz (71.2 kg)] 156 lb 15.5 oz (71.2 kg) (03/14 2014) Weight change:  Last BM Date: 08/26/13  Intake/Output from previous day: 03/14 0701 - 03/15 0700 In: 480 [P.O.:480] Out: 2510  Last cbgs: CBG (last 3)   Recent Labs  08/27/13 1155 08/27/13 1342 08/27/13 2042  GLUCAP 211* 230* 126*     Physical Exam General: No apparent distress   HEENT: not dry Lungs: Normal effort. Lungs clear to auscultation, no crackles or wheezes. Cardiovascular: Regular rate and rhythm, no edema Abdomen: S/NT/ND; BS(+) Musculoskeletal:  unchanged Neurological: No new neurological deficits Wounds: N/A    Skin: clear  Aging changes Mental state: Alert, oriented, cooperative    Lab Results: BMET    Component Value Date/Time   NA 133* 08/25/2013 1808   K 4.2 08/25/2013 1808   CL 93* 08/25/2013 1808   CO2 25 08/25/2013 1808   GLUCOSE 139* 08/25/2013 1808   BUN 36* 08/25/2013 1808   CREATININE 5.71* 08/25/2013 1808   CALCIUM 8.8 08/25/2013 1808   GFRNONAA 7* 08/25/2013 1808   GFRAA 8* 08/25/2013 1808   CBC    Component Value Date/Time   WBC 8.2 08/25/2013 1809   RBC 3.36* 08/25/2013 1809   HGB 9.8* 08/25/2013 1809   HCT 29.0* 08/25/2013 1809   PLT 192 08/25/2013 1809   MCV 86.3 08/25/2013 1809   MCH 29.2 08/25/2013 1809   MCHC 33.8 08/25/2013 1809   RDW 16.8* 08/25/2013 1809   LYMPHSABS 1.4 07/20/2013 0910   MONOABS 1.7* 07/20/2013 0910   EOSABS 0.0 07/20/2013 0910   BASOSABS 0.0 07/20/2013 0910    Studies/Results: No results found.  Medications: I have reviewed the patient's  current medications.  Assessment/Plan:  1. Bi cerebral infarcts  2. DVT Prophylaxis/Anticoagulation: Subcutaneous heparin. Monitor platelet counts and any signs of bleeding  3. Pain Management: Tylenol as needed  4. Neuropsych: This patient is capable of making decisions on her own behalf.  5. Dysphagia. Dysphagia 2 nectar liquids. Followup speech therapy  6. End stage renal disease with hemodialysis. Followup per renal services  7. Diabetes mellitus with peripheral neuropathy. Hemoglobin A1c 7.1. Insulin NPH 5 units twice a day. Check blood sugars a.c. and at bedtime  8. Hypertension. No current antihypertensive medication. Monitor with increased mobility. Patient on labetalol 200 mg twice a day prior to admission restart  9. Chronic anemia. Continue Aranesp. Followup labs with dialysis     Length of stay, days: 5  Walker Kehr , MD 08/28/2013, 7:53 AM

## 2013-08-29 ENCOUNTER — Inpatient Hospital Stay (HOSPITAL_COMMUNITY): Payer: Medicare Other

## 2013-08-29 ENCOUNTER — Inpatient Hospital Stay (HOSPITAL_COMMUNITY): Payer: Medicare Other | Admitting: Occupational Therapy

## 2013-08-29 ENCOUNTER — Inpatient Hospital Stay (HOSPITAL_COMMUNITY): Payer: Medicare Other | Admitting: Speech Pathology

## 2013-08-29 ENCOUNTER — Inpatient Hospital Stay (HOSPITAL_COMMUNITY): Payer: Medicare Other | Admitting: Physical Therapy

## 2013-08-29 ENCOUNTER — Encounter (HOSPITAL_COMMUNITY): Payer: Self-pay | Admitting: *Deleted

## 2013-08-29 DIAGNOSIS — I10 Essential (primary) hypertension: Secondary | ICD-10-CM

## 2013-08-29 DIAGNOSIS — I6350 Cerebral infarction due to unspecified occlusion or stenosis of unspecified cerebral artery: Secondary | ICD-10-CM

## 2013-08-29 DIAGNOSIS — N186 End stage renal disease: Secondary | ICD-10-CM

## 2013-08-29 DIAGNOSIS — E1142 Type 2 diabetes mellitus with diabetic polyneuropathy: Secondary | ICD-10-CM

## 2013-08-29 DIAGNOSIS — E1149 Type 2 diabetes mellitus with other diabetic neurological complication: Secondary | ICD-10-CM

## 2013-08-29 DIAGNOSIS — I69991 Dysphagia following unspecified cerebrovascular disease: Secondary | ICD-10-CM

## 2013-08-29 LAB — CBC
HEMATOCRIT: 27.6 % — AB (ref 36.0–46.0)
Hemoglobin: 9.3 g/dL — ABNORMAL LOW (ref 12.0–15.0)
MCH: 28.9 pg (ref 26.0–34.0)
MCHC: 33.7 g/dL (ref 30.0–36.0)
MCV: 85.7 fL (ref 78.0–100.0)
PLATELETS: 213 10*3/uL (ref 150–400)
RBC: 3.22 MIL/uL — ABNORMAL LOW (ref 3.87–5.11)
RDW: 16.9 % — AB (ref 11.5–15.5)
WBC: 11.7 10*3/uL — ABNORMAL HIGH (ref 4.0–10.5)

## 2013-08-29 LAB — GLUCOSE, CAPILLARY
GLUCOSE-CAPILLARY: 143 mg/dL — AB (ref 70–99)
Glucose-Capillary: 161 mg/dL — ABNORMAL HIGH (ref 70–99)
Glucose-Capillary: 240 mg/dL — ABNORMAL HIGH (ref 70–99)
Glucose-Capillary: 391 mg/dL — ABNORMAL HIGH (ref 70–99)
Glucose-Capillary: 349 mg/dL — ABNORMAL HIGH (ref 70–99)

## 2013-08-29 LAB — RENAL FUNCTION PANEL
ALBUMIN: 3 g/dL — AB (ref 3.5–5.2)
BUN: 33 mg/dL — ABNORMAL HIGH (ref 6–23)
CHLORIDE: 88 meq/L — AB (ref 96–112)
CO2: 26 mEq/L (ref 19–32)
Calcium: 8.8 mg/dL (ref 8.4–10.5)
Creatinine, Ser: 5.11 mg/dL — ABNORMAL HIGH (ref 0.50–1.10)
GFR, EST AFRICAN AMERICAN: 9 mL/min — AB (ref >90)
GFR, EST NON AFRICAN AMERICAN: 8 mL/min — AB (ref >90)
Glucose, Bld: 293 mg/dL — ABNORMAL HIGH (ref 70–99)
Phosphorus: 3.6 mg/dL (ref 2.3–4.6)
Potassium: 4.2 mEq/L (ref 3.7–5.3)
SODIUM: 129 meq/L — AB (ref 137–147)

## 2013-08-29 NOTE — Discharge Summary (Signed)
Discharge summary job # [id number]

## 2013-08-29 NOTE — Progress Notes (Signed)
Earlier this morning during OT session with [name], Patient became dizzy, light headed, BP 172/101. 12 lead EKG obtained, results given to [name],. PA no new orders given, patient received oral scheduled blood pressure medications, recheck BP 154/76. [name] a physical therapist reports a change in functional status of the patient and a slight increase in confusion, Per patient's daughter same type of thing happened when they brought her in and was told she had a stroke. [name], PA made aware of the concerns of [name] via phone conversion with her, Patient working with patient to tolerance. Vital signs stable. [name], [name] [name]

## 2013-08-29 NOTE — Progress Notes (Signed)
Occupational Therapy Session Note  Patient Details  Name: [name] MRN: [id number] Date of Birth: 06/08/1941  Today's Date: 08/29/2013 Time: 0735-0820 and [id number] Time Calculation (min): 45 min and 15 min  Short Term Goals: Week 1:  OT Short Term Goal 1 (Week 1): STG = LTGs due to ELOS  Skilled Therapeutic Interventions/Progress Updates:    1) Pt seen for ADL retraining with focus on functional mobility, visual scanning and attention to Rt, problem solving, and sequencing with self-care tasks of bathing and dressing.  Pt ambulated to sink to complete bathing at standing level, while washing UB pt became semi-unresponsive, requiring increased verbal cues to sequence returning to seated position.  Pt reports feeling dizzy and lightheaded, BP checked see doc flowsheets and provided pt with cool washcloth.  After a few mins pt returned to bathing at mostly seated level with marginally slower processing time throughout session.  Pt required verbal cues for sequencing with donning shirt.  Pt returned to bed at end of session to have EKG.  2) Pt missed 30 mins secondary to review of CT scan with RN and PA and transfer to HD.  Engaged in bed mobility and discussion of symptoms of elevated BP and d/c planning.  Pt's daughter requesting pt stay to allow for increased medical management as pt with increased difficulty with processing, following directions, and mobility today.  Pt reports some weakness in LUE, however upon assessment no change from baseline.  Unable to assess LUE in functional context as transport arrived to transfer pt to HD.  Feel that pt would benefit from further clarification regarding BP management and additional treatment session prior to d/c home when medically stable.  Therapy Documentation Precautions:  Precautions Precautions: Fall Precaution Comments: diaylsis port Rt chest, IV Rt forearm, restricted Lt UE Restrictions Weight Bearing Restrictions: No General:    Vital Signs: Therapy Vitals Pulse Rate: 72 BP: 173/96 mmHg Patient Position, if appropriate: Sitting Pain:  Pt with no c/o pain  See FIM for current functional status  Therapy/Group: Individual Therapy  Simonne Come 08/29/2013, 10:17 AM

## 2013-08-29 NOTE — Progress Notes (Signed)
Physical Therapy Discharge Summary  Patient Details  Name: [name] MRN: [id number] Date of Birth: [birth year]  Today's Date: 08/30/2013 Time: 1001-1058 Time Calculation (min): 57 min  Patient has met 8 of 11 long term goals due to increased strength of R side, functional use of  right upper extremity and right lower extremity, improved attention, improved awareness and improved coordination RUE and RLE.  Pt is demonstrating new impaired strength and coordination on L side; f/u CT scan performed but no new areas of infarction noted.  Patient to discharge at an ambulatory level Supervision with RW and use of manual w/c for longer distances.   Patient's care partner is independent to provide the necessary cognitive and supervision assistance at discharge.  Reasons goals not met: Bed <> chair and furniture transfer goals did not reach mod I secondary to intermittent cues for safe hand placement and sequencing.  Also did not reach floor transfer goal of supervision; pt required min A for balance and total verbal cues for sequencing  Recommendation:  Patient will benefit from ongoing skilled PT services in outpatient setting to continue to advance safe functional mobility, address ongoing impairments in strength, activity tolerance, coordination/motor control and ataxia, postural control, balance, gait, cognition and minimize fall risk.  Equipment: manual w/c  Reasons for discharge: discharge from hospital  Patient/family agrees with progress made and goals achieved: Yes  PT Discharge Daughter [name] present for family education.  Reviewed car transfer and one step negotiation for home entry/exit with RW with supervision overall.  Re-assessment of BERG balance pt presented with more impaired balance, falls risk and impaired strength L side.  Pt also demonstrated impaired endurance during assessment and required multiple sitting rest breaks.  Pt unable to finish assessment and returned  to room.  Vitals assessed, CBG assessed and RN and PA notified.  Pt also c/o pain in RUE and mid back and nausea. Secondary to drop in BP in upright positions and increase with supine PA recommending TED hose for BP stabilization.  Daughter present and aware of recommendations from PA for wear of TED hose and safety.    Vital Signs Therapy Vitals Temp: 98 F (36.7 C) Temp src: Oral Pulse Rate: 71 Resp: 18 BP: 109/65 mmHg Patient Position, if appropriate: Lying Oxygen Therapy SpO2: 99 % Pain Pain Assessment Pain Assessment: 0-10 Pain Score: 2  Pain Type: Acute pain Pain Location: Elbow Pain Orientation: Left Pain Descriptors / Indicators: Sore Pain Onset: Gradual Pain Intervention(s): Other (Comment) (pt did not want to request anything for pain) Vision/Perception  Perception Perception: Impaired Inattention/Neglect: Does not attend to left visual field (different from eval) Praxis Praxis: Impaired Praxis Impairment Details: Motor planning  Cognition Orientation Level: Oriented X4 Sensation Sensation Light Touch: Not tested Stereognosis: Not tested Hot/Cold: Not tested Proprioception: Not tested Coordination Gross Motor Movements are Fluid and Coordinated: No Finger Nose Finger Test: Different from eval; RUE WFL, LUE now requiring increased time, mild ataxia/impaired coordination Heel Shin Test: requires increased time, mild ataxia/coordination LLE now; different from eval Motor  Motor Motor: Ataxia;Motor impersistence;Other (comment);Abnormal postural alignment and control Motor - Discharge Observations: LUE and LLE weakness now present with L ataxia and motor impersistence, continues to drift to R mildly during gait  Mobility Bed Mobility Bed Mobility: Supine to Sit;Sit to Supine Supine to Sit: 6: Modified independent (Device/Increase time) Sit to Supine: 6: Modified independent (Device/Increase time) Transfers Transfers: Yes Stand Pivot Transfers: 5:  Supervision Stand Pivot Transfer Details (indicate cue type and reason):  Requires supervision for stand pivot bed <> chair and for simulated car transfer with RW secondary to pt attempting to pull up on RW; required cues for safe sequence and hand placement.  Locomotion  Ambulation Ambulation/Gait Assistance: 5: Supervision Ambulation Distance (Feet): 150 Feet Ambulation/Gait Assistance Details: With RW in controlled and home environments; mild R drift noted but gait velocity improved even with head turns and conversation Gait Gait Pattern: Impaired Gait Pattern: Step-through pattern;Lateral trunk lean to right;Narrow base of support, shuffling and decreased foot clearance. Stairs / Additional Locomotion Stairs: Yes Stairs Assistance: 5: Supervision Stairs Assistance Details (indicate cue type and reason): Performed stair negotiation to simulated one step to porch for home entry/exit with RW; pt performed with supervision but verbal cues for safe sequence and placement of RW; improved attention to R wheel placement Stair Management Technique: Step to pattern;No rails;Forwards;With walker Number of Stairs: 4 Height of Stairs: 4 Wheelchair Mobility Wheelchair Mobility: No  Trunk/Postural Assessment  Cervical Assessment Cervical Assessment: Within Functional Limits Thoracic Assessment Thoracic Assessment: Within Functional Limits Lumbar Assessment Lumbar Assessment: Within Functional Limits Postural Control Postural Control: Deficits on evaluation (impaired balance strategies in standing)  Balance Standardized Balance Assessment Standardized Balance Assessment: Berg Balance Test Berg Balance Test Sit to Stand: Able to stand  independently using hands Standing Unsupported: Able to stand safely 2 minutes Sitting with Back Unsupported but Feet Supported on Floor or Stool: Able to sit safely and securely 2 minutes Stand to Sit: Controls descent by using hands Transfers: Able to transfer  safely, minor use of hands Standing Unsupported with Eyes Closed: Able to stand 10 seconds safely Standing Ubsupported with Feet Together: Able to place feet together independently and stand for 1 minute with supervision From Standing, Reach Forward with Outstretched Arm: Can reach confidently >25 cm (10") From Standing Position, Pick up Object from Floor: Able to pick up shoe safely and easily From Standing Position, Turn to Look Behind Over each Shoulder: Turn sideways only but maintains balance Turn 360 Degrees: Able to turn 360 degrees safely but slowly Standing Unsupported, Alternately Place Feet on Step/Stool: Needs assistance to keep from falling or unable to try Standing Unsupported, One Foot in Front: Loses balance while stepping or standing Standing on One Leg: Unable to try or needs assist to prevent fall Total Score: 37 Patient demonstrates increased fall risk as noted by score of 37/56 on Berg Balance Scale.  (<36= high risk for falls, close to 100%; 37-45 significant >80%; 46-51 moderate >50%; 52-55 lower >25%)  Extremity Assessment  RLE Strength RLE Overall Strength: Deficits RLE Overall Strength Comments: 4-/5 overall LLE Strength LLE Overall Strength: Deficits LLE Overall Strength Comments: LLE noted to be weaker than at eval; 4-/5  See FIM for current functional status  Raylene Everts [facility] 08/29/2013, 5:49 PM

## 2013-08-29 NOTE — Progress Notes (Signed)
Physical Therapy Session Note  Patient Details  Name: [name] MRN: [id number] Date of Birth: 11/01/1940  Today's Date: 08/29/2013 Time: [id number] Time Calculation (min): 66 min  Short Term Goals: Week 1:  PT Short Term Goal 1 (Week 1): = LTG secondary to short LOS  Skilled Therapeutic Interventions/Progress Updates:   Pt and daughter reporting episode of non-responsiveness this am with elevated BP with OT.  Daughter is concerned because these were the symptoms last week that prompted her to call EMS and have pt brought into the hospital.  Daughter also reporting increased confusion and decreased ability to use LUE. During MMT and coordination re-assessment, changes noted in LUE and LLE strength (decreased) and impaired coordination/ataxia in LUE and LLE now not present at eval.  Pt also requiring increased cues for sequencing today.  RN and PA alerted with PA ordering f/u CT scan.  Continued with family education session with focus on gait with RW with supervision with intermittent verbal cues for attention to obstacles on L side today and problem solving obstacle negotiation, simulated tall car transfer training with RW stand pivot with supervision with verbal and visual cues for safe approach and sequence to enter car, one step training with RW x 4 reps with supervision but verbal cues for safe sequence with RW, low furniture/couch transfers with verbal cues for safe hand placement secondary to attempting to pull to stand on RW, and floor > furniture transfer training with pt requiring min A and total verbal and visual cues for initiation and sequencing and discussing indications for calling EMS if pt does have a fall at home.  Returned to room and pt transferred to bed with supervision secondary to fatigue.  Will plan on therapy seeing pt tomorrow to determine if pt is safe to D/C home with daughters.    Therapy Documentation Precautions:  Precautions Precautions: Fall Precaution  Comments: diaylsis port Rt chest, IV Rt forearm, restricted Lt UE Restrictions Weight Bearing Restrictions: No Vital Signs: Therapy Vitals Temp: 98 F (36.7 C) Temp src: Oral Pulse Rate: 78 Resp: 19 BP: 184/88 mmHg Patient Position, if appropriate: Lying Oxygen Therapy SpO2: 99 % O2 Device: None (Room air) Pain:  Pt reporting soreness in L elbow but denied pain with MMT or PROM; did report some pain with WB during floor transfer.  Did not wish to request anything for pain.    See FIM for current functional status  Therapy/Group: Individual Therapy  Raylene Everts [facility] 08/29/2013, 8:52 AM

## 2013-08-29 NOTE — Progress Notes (Signed)
Social Work Patient ID: [name], female   DOB: Nov 05, 1940, 73 y.o.   MRN: [id number] Daughter here to participate in therapies with pt to learn Mom's care.  Pt had issues this am and is wondering if they will let her go home  Tomorrow.  Discussed up to MD and if medically stable.  Will work on setting up discharge needs for discharge.

## 2013-08-29 NOTE — Discharge Summary (Signed)
[name], [name] NO.:  [id number]  MEDICAL RECORD NO.:  [id number]  LOCATION:  4M11C                        FACILITY:  [facility]  PHYSICIAN:  Charlett Blake, M.D.DATE OF BIRTH:  01/19/1941  DATE OF ADMISSION:  08/23/2013 DATE OF DISCHARGE:  08/30/2013                              DISCHARGE SUMMARY   DISCHARGE DIAGNOSES: 1. Bicerebral infarctions. 2. Subcutaneous heparin for DVT prophylaxis. 3. Dysphagia. 4. End-stage renal disease with hemodialysis. 5. Diabetes mellitus and peripheral neuropathy. 6. Hypertension. 7. Chronic anemia.  HISTORY OF PRESENT ILLNESS:  This is a 73 year old right-handed female with history of hypertension, end-stage renal disease, hemodialysis as well as CVA July 20, 2013, showing bilateral acute and subacute supra tentorial infarcts discharged to home on July 22, 2013, with home health therapies using a walker.  Maintained on aspirin for CVA prophylaxis.  Admitted August 19, 2013, with right-sided weakness as well as altered mental status.  MRI of the brain showed scattered acute infarcts throughout both cerebral and cerebellar hemispheres.  Follow up Neurology Services.  Aspirin discontinued placed on Plavix therapy.  A loop recorder in place from previous admission, no further stroke workup indicated.  Hemodialysis ongoing as per Renal Services.  Subcutaneous heparin for DVT prophylaxis.  Currently maintained on a dysphagia 2 nectar thick liquid diet.  Physical and occupational therapy ongoing. The patient was admitted for comprehensive rehab program.  PAST MEDICAL HISTORY:  See discharge diagnoses.  SOCIAL HISTORY:  Lives with children and assistance as needed.  FUNCTIONAL HISTORY:  Prior to admission, using a walker.  FUNCTIONAL STATUS:  Upon admission to [facility] was ambulating 140 feet, +1 total assist for upper body bathing and dressing as well as lower body.  PHYSICAL EXAMINATION:  VITAL SIGNS:  Blood  pressure 161/82, pulse 96, temperature 98.7, respirations 20. GENERAL:  This was an alert female, in no acute distress.  She was able to provide her name, age, as well as date of birth. HEENT:  Pupils round and reactive to light. LUNGS:  Clear to auscultation. CARDIAC:  Regular rate and rhythm. ABDOMEN:  Soft, nontender.  Good bowel sounds.  She did show some ataxia to the lower extremity.  REHABILITATION HOSPITAL COURSE:  The patient was admitted to Inpatient Rehab Services with therapies initiated on a 3-hour daily basis consisting of physical therapy, occupational therapy, speech therapy, and rehabilitation nursing.  The following issues were addressed during the patient's rehabilitation stay.  Pertaining to Ms. Kilfoyle's bicerebral infarctions remain stable, maintained on Plavix therapy. Followup cranial CT scan 08/29/2013 without new abnormalities. Subcutaneous heparin for DVT prophylaxis with no bleeding episodes.  Her diet was steadily advanced to a regular consistency with diabetic renal restrictions.  She continued on dialysis as per Renal Services and would be ongoing as an outpatient with latest creatinine 5.71.  She did have a history of diabetes mellitus, peripheral neuropathy, hemoglobin A1c 7.1, maintained on insulin NovoLog 7 units t.i.d.  Full diabetic teaching. Her blood pressure is controlled, monitored on labetalol 200 mg b.i.d. No orthostatic changes.  The patient received weekly collaborative interdisciplinary team conferences to discuss estimated length of stay, family teaching, and any barriers to discharge.  The patient ambulated increased distances 200-300  feet with a rolling walker, requiring some cues.  She remained aware of obstacles, required intermittent rest breaks for fatigue.  Her strength and endurance did continue to improve. Engaged in self-care, re-training to include sponge baths, dressing, and grooming, independent activities of daily living in  her wheelchair. Required minimal verbal cues for utilization of external memory aid to increase recall of rolls and learning of new tasks.  Full family teaching was completed and plan will be discharged to home.  DISCHARGE MEDICATIONS:  Tums 1 p.o. t.i.d., Plavix 75 mg p.o. daily, insulin NPH 5 units b.i.d., labetalol 200 mg p.o. b.i.d., multivitamin daily, Protonix 40 mg p.o. daily.  DIET:  Renal diabetic diet.  SPECIAL INSTRUCTIONS:  The patient would follow up Dr. Alysia Penna at the [facility] as advised, Dr. Fleet Contras as per Renal Services.  The patient did transition to dialysis with [facility] in [place], [place].  Follow Dr. [name], Medical Management.     Lauraine Rinne, P.A.   ______________________________ Charlett Blake, M.D.    DA/MEDQ  D:  08/29/2013  T:  08/29/2013  Job:  [id number]  cc:   Barrie Folk. Berdine Addison, MD

## 2013-08-29 NOTE — Progress Notes (Signed)
Subjective/Complaints: 73 y.o. 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 Admitted 08/19/2013 with right side weakness as well as altered mental status. MRI of the brain showed scattered acute infarcts throughout both cerebral and cerebellar hemispheres. Followup neurology services aspirin discontinued placed on Plavix. A loop recorder in place from previous admission. No further stroke workup indicated. Hemodialysis ongoing as per renal services. Subcutaneous heparin for DVT prophylaxis. Currently maintained on dysphagia 2 nectar thick liquid diet  Became dizzy at sink during ADL Pt denies CP, SOB, Abd pain, or vomiting  Review of Systems - Negative except weakness  Objective: Vital Signs: Blood pressure 184/88, pulse 78, temperature 98 F (36.7 C), temperature source Oral, resp. rate 19, weight 71.2 kg (156 lb 15.5 oz), SpO2 99.00%. No results found. Results for orders placed during the hospital encounter of 08/23/13 (from the past 72 hour(s))  GLUCOSE, CAPILLARY     Status: Abnormal   Collection Time    08/26/13 12:15 PM      Result Value Ref Range   Glucose-Capillary 225 (*) 70 - 99 mg/dL   Comment 1 Notify RN    GLUCOSE, CAPILLARY     Status: Abnormal   Collection Time    08/26/13  4:58 PM      Result Value Ref Range   Glucose-Capillary 255 (*) 70 - 99 mg/dL   Comment 1 Notify RN    GLUCOSE, CAPILLARY     Status: Abnormal   Collection Time    08/26/13  9:34 PM      Result Value Ref Range   Glucose-Capillary 198 (*) 70 - 99 mg/dL  GLUCOSE, CAPILLARY     Status: Abnormal   Collection Time    08/27/13  7:33 AM      Result Value Ref Range   Glucose-Capillary 149 (*) 70 -  99 mg/dL   Comment 1 Notify RN    GLUCOSE, CAPILLARY     Status: Abnormal   Collection Time    08/27/13 11:55 AM      Result Value Ref Range   Glucose-Capillary 211 (*) 70 - 99 mg/dL   Comment 1 Notify RN    GLUCOSE, CAPILLARY     Status: Abnormal   Collection Time    08/27/13  1:42 PM      Result Value Ref Range   Glucose-Capillary 230 (*) 70 - 99 mg/dL   Comment 1 Notify RN    GLUCOSE, CAPILLARY     Status: Abnormal   Collection Time    08/27/13  8:42 PM      Result Value Ref Range   Glucose-Capillary 126 (*) 70 - 99 mg/dL   Comment 1 Notify RN    GLUCOSE, CAPILLARY     Status: Abnormal   Collection Time    08/28/13  7:50 AM      Result Value Ref Range   Glucose-Capillary 155 (*) 70 - 99 mg/dL  GLUCOSE, CAPILLARY     Status: Abnormal   Collection Time    08/28/13 11:35 AM      Result Value Ref Range   Glucose-Capillary 182 (*) 70 - 99 mg/dL  GLUCOSE, CAPILLARY     Status: Abnormal   Collection Time    08/28/13  4:53 PM  Result Value Ref Range   Glucose-Capillary 303 (*) 70 - 99 mg/dL  GLUCOSE, CAPILLARY     Status: Abnormal   Collection Time    08/28/13  8:30 PM      Result Value Ref Range   Glucose-Capillary 170 (*) 70 - 99 mg/dL  GLUCOSE, CAPILLARY     Status: Abnormal   Collection Time    08/29/13  7:25 AM      Result Value Ref Range   Glucose-Capillary 161 (*) 70 - 99 mg/dL      Physical Exam  HENT:  Head: Normocephalic.  Eyes: EOM are normal.  Neck: Normal range of motion. Neck supple. No thyromegaly present.  Cardiovascular: Normal rate and regular rhythm.  Respiratory: Effort normal and breath sounds normal. No respiratory distress.  GI: Soft. Bowel sounds are normal. She exhibits no distension.  Neurological: She is alert.  Patient was able to provide her name, age and date of birth. She followed simple commands. Ataxic LE mod, UE mild. Mild weakness 5/5 RUE, 4+/5 LUE , 4+/5 in LE's  Skin: Skin is warm and mild diaphoresis  Psychiatric: She has  a normal mood and affect. Her behavior is normal Gen NAD  Assessment/Plan: 1. Functional deficits secondary to bicerebral and cerebellar embolic infarcts which require 3+ hours per day of interdisciplinary therapy in a comprehensive inpatient rehab setting. Physiatrist is providing close team supervision and 24 hour management of active medical problems listed below. Physiatrist and rehab team continue to assess barriers to discharge/monitor patient progress toward functional and medical goals. FIM: FIM - Bathing Bathing Steps Patient Completed: Chest;Right Arm;Left Arm;Abdomen;Front perineal area;Buttocks;Right upper leg;Left upper leg;Right lower leg (including foot);Left lower leg (including foot) Bathing: 5: Supervision: Safety issues/verbal cues  FIM - Upper Body Dressing/Undressing Upper body dressing/undressing steps patient completed: Thread/unthread left sleeve of pullover shirt/dress;Put head through opening of pull over shirt/dress;Pull shirt over trunk;Thread/unthread right sleeve of pullover shirt/dresss Upper body dressing/undressing: 5: Supervision: Safety issues/verbal cues FIM - Lower Body Dressing/Undressing Lower body dressing/undressing steps patient completed: Thread/unthread right underwear leg;Thread/unthread left underwear leg;Pull underwear up/down;Thread/unthread right pants leg;Thread/unthread left pants leg;Pull pants up/down;Don/Doff right shoe;Don/Doff left shoe;Fasten/unfasten right shoe;Fasten/unfasten left shoe Lower body dressing/undressing: 5: Supervision: Safety issues/verbal cues     FIM - Radio producer Devices: Walker;Grab bars Toilet Transfers: 5-To toilet/BSC: Supervision (verbal cues/safety issues);4-From toilet/BSC: Min A (steadying Pt. > 75%)  FIM - Bed/Chair Transfer Bed/Chair Transfer Assistive Devices: Arm rests Bed/Chair Transfer: 5: Supine > Sit: Supervision (verbal cues/safety issues);5: Bed > Chair or W/C:  Supervision (verbal cues/safety issues)  FIM - Locomotion: Wheelchair Distance: 150 in controlled environment initially with bilat LE propulsion and then switching to UE propulsion with verbal and visual cues for sequencing and attention to RUE  Locomotion: Wheelchair: 0: Activity did not occur FIM - Locomotion: Ambulation Locomotion: Ambulation Assistive Devices: Administrator Ambulation/Gait Assistance: 5: Supervision Locomotion: Ambulation: 5: Travels 150 ft or more with supervision/safety issues (150-200 feet)  Comprehension Comprehension Mode: Auditory Comprehension: 5-Follows basic conversation/direction: With no assist  Expression Expression Mode: Verbal Expression: 5-Expresses basic needs/ideas: With no assist  Social Interaction Social Interaction: 5-Interacts appropriately 90% of the time - Needs monitoring or encouragement for participation or interaction.  Problem Solving Problem Solving: 4-Solves basic 75 - 89% of the time/requires cueing 10 - 24% of the time  Memory Memory: 4-Recognizes or recalls 75 - 89% of the time/requires cueing 10 - 24% of the time  Medical Problem List and Plan:  1. Bi cerebral infarcts  2. DVT Prophylaxis/Anticoagulation: Subcutaneous heparin. Monitor platelet counts and any signs of bleeding  3. Pain Management: Tylenol as needed  4. Neuropsych: This patient is capable of making decisions on her own behalf.  5. Dysphagia. Dysphagia 2 nectar liquids. Followup speech therapy  6. End stage renal disease with hemodialysis. Followup per renal services  7. Diabetes mellitus with peripheral neuropathy. Hemoglobin A1c 7.1. Insulin NPH 5 units twice a day. Check blood sugars a.c. and at bedtime  8. Hypertension.Hi dose labetolol, prn clonidine, will defer to renal for management.9. Chronic anemia. Continue Aranesp per Renal. Followup labs with dialysis   LOS (Days) 6 A FACE TO FACE EVALUATION WAS PERFORMED  Analyah Mcconnon E 08/29/2013, 7:45  AM

## 2013-08-29 NOTE — Progress Notes (Signed)
Patient ID: BANESSA CRINCOLI, female   DOB: 06/25/1940, 73 y.o.   MRN: [id number]   [facility] Progress Note   Assessment/ Plan:   1. Acute bilateral strokes, possibly watershed from hypotension: ongoing PT/OT retraining 2. ESRD dialysis today to resume a MWF dialysis schedule 3. 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 Nutrition:without swallowing defect, monitor diet 6. Hypertension: tolerating elevated BP for fear of worsening CVA penumbra  Subjective:   Reports to be feeling fair- no acute complaints   Objective:   BP 173/96  Pulse 72  Temp(Src) 98 F (36.7 C) (Oral)  Resp 19  Wt 71.2 kg (156 lb 15.5 oz)  SpO2 99%  Physical Exam: [id number] sitting up in bed- RIJ TDC [id number] RRR, normal S1 and S2 Resp:CTA bilaterally, no rales [id number], flat, NT Ext:No LE edema  Labs: BMET  Recent Labs Lab 08/23/13 1115 08/25/13 1808  NA 139 133*  K 5.1 4.2  CL 99 93*  CO2 27 25  GLUCOSE 209* 139*  BUN 43* 36*  CREATININE 6.25* 5.71*  CALCIUM 8.3* 8.8  PHOS 3.4 3.5   CBC  Recent Labs Lab 08/23/13 1114 08/25/13 1809  WBC 10.3 8.2  HGB 8.8* 9.8*  HCT 26.2* 29.0*  MCV 85.9 86.3  PLT 201 192   Medications:    . calcium carbonate  1 tablet Oral TID WC  . clopidogrel  75 mg Oral Q breakfast  . [START ON 08/30/2013] darbepoetin (ARANESP) injection - DIALYSIS  60 mcg Intravenous Q Tue-HD  . doxercalciferol  1 mcg Intravenous Q T,Th,Sa-HD  . erythromycin  250 mg Oral TID WC & HS  . [START ON 08/30/2013] ferric gluconate (FERRLECIT/NULECIT) IV  62.5 mg Intravenous Q Tue-HD  . heparin  5,000 Units Subcutaneous 3 times per day  . insulin aspart  0-9 Units Subcutaneous TID WC  . insulin NPH Human  5 Units Subcutaneous BID AC & HS  . labetalol  200 mg Oral BID  . multivitamin  1 tablet Oral QHS  . pantoprazole sodium  40 mg Oral Daily   Elmarie Shiley,  MD 08/29/2013, 10:31 AM

## 2013-08-29 NOTE — Progress Notes (Signed)
Social Work Patient ID: [name], female   DOB: 09/07/1940, 73 y.o.   MRN: [id number] Pt requires a lightweight wheelchair to be able to self propel and uses for self care tasks.  She is unable to self propel in a standard weight wheelchair.

## 2013-08-29 NOTE — Progress Notes (Signed)
Speech Language Pathology Daily Session Note  Patient Details  Name: [name] MRN: [id number] Date of Birth: 1940-09-03  Today's Date: 08/29/2013 Time: [id number] Time Calculation (min): 45 min  Short Term Goals: Week 1: SLP Short Term Goal 1 (Week 1): STG=LTG  Skilled Therapeutic Interventions: Skilled treatment session focused on cognitive goals. SLP facilitated session with reinforcement of new learning task that was taught to patient on Saturday.  Patient required Mod faded to Supervision level verbal cues to recall procedures of task and to utilize external memory aid to increase recall of rules to a new learning task.  SLP educated patient and family memeber regarding memory compensatory strategies to utilize at home.  Continue with current plan of care.    FIM:  Comprehension Comprehension Mode: Auditory Comprehension: 5-Understands complex 90% of the time/Cues < 10% of the time Expression Expression Mode: Verbal Expression: 5-Expresses basic needs/ideas: With no assist Social Interaction Social Interaction: 5-Interacts appropriately 90% of the time - Needs monitoring or encouragement for participation or interaction. Problem Solving Problem Solving: 4-Solves basic 75 - 89% of the time/requires cueing 10 - 24% of the time Memory Memory: 4-Recognizes or recalls 75 - 89% of the time/requires cueing 10 - 24% of the time  Pain Pain Assessment Pain Assessment: No/denies pain Pain Score: 0-No pain Patients Stated Pain Goal: 3 Multiple Pain Sites: No  Therapy/Group: Individual Therapy  Carmelia Roller., [place] [phone]  [place] 08/29/2013, 12:24 PM

## 2013-08-30 ENCOUNTER — Encounter (HOSPITAL_COMMUNITY): Payer: Medicare Other

## 2013-08-30 ENCOUNTER — Other Ambulatory Visit (HOSPITAL_COMMUNITY): Payer: Medicare Other

## 2013-08-30 ENCOUNTER — Inpatient Hospital Stay (HOSPITAL_COMMUNITY): Payer: Medicare Other | Admitting: Occupational Therapy

## 2013-08-30 ENCOUNTER — Ambulatory Visit: Payer: Medicare Other | Admitting: Vascular Surgery

## 2013-08-30 ENCOUNTER — Inpatient Hospital Stay (HOSPITAL_COMMUNITY): Payer: Medicare Other | Admitting: Physical Therapy

## 2013-08-30 ENCOUNTER — Inpatient Hospital Stay (HOSPITAL_COMMUNITY): Payer: Medicare Other | Admitting: Speech Pathology

## 2013-08-30 DIAGNOSIS — I6350 Cerebral infarction due to unspecified occlusion or stenosis of unspecified cerebral artery: Secondary | ICD-10-CM

## 2013-08-30 DIAGNOSIS — E1142 Type 2 diabetes mellitus with diabetic polyneuropathy: Secondary | ICD-10-CM

## 2013-08-30 DIAGNOSIS — E1149 Type 2 diabetes mellitus with other diabetic neurological complication: Secondary | ICD-10-CM

## 2013-08-30 DIAGNOSIS — I69991 Dysphagia following unspecified cerebrovascular disease: Secondary | ICD-10-CM

## 2013-08-30 DIAGNOSIS — N186 End stage renal disease: Secondary | ICD-10-CM

## 2013-08-30 DIAGNOSIS — I10 Essential (primary) hypertension: Secondary | ICD-10-CM

## 2013-08-30 LAB — GLUCOSE, CAPILLARY
Glucose-Capillary: 156 mg/dL — ABNORMAL HIGH (ref 70–99)
Glucose-Capillary: 184 mg/dL — ABNORMAL HIGH (ref 70–99)
Glucose-Capillary: 210 mg/dL — ABNORMAL HIGH (ref 70–99)

## 2013-08-30 MED ORDER — LABETALOL HCL 200 MG PO TABS: 200.0000 mg | ORAL_TABLET | Freq: Two times a day (BID) | ORAL | Status: DC

## 2013-08-30 MED ORDER — INSULIN NPH (HUMAN) (ISOPHANE) 100 UNIT/ML ~~LOC~~ SUSP: 5.0000 [IU] | Freq: Two times a day (BID) | SUBCUTANEOUS | Status: DC

## 2013-08-30 MED ORDER — PANTOPRAZOLE SODIUM 40 MG PO PACK: 40.0000 mg | PACK | Freq: Every day | ORAL | Status: DC

## 2013-08-30 MED ORDER — ERYTHROMYCIN BASE 250 MG PO TBEC: 250.0000 mg | DELAYED_RELEASE_TABLET | Freq: Three times a day (TID) | ORAL | Status: DC

## 2013-08-30 MED ORDER — CLOPIDOGREL BISULFATE 75 MG PO TABS: 75.0000 mg | ORAL_TABLET | Freq: Every day | ORAL | Status: DC

## 2013-08-30 NOTE — Progress Notes (Signed)
Occupational Therapy Discharge Summary  Patient Details  Name: MARSELA KUAN MRN: [id number] Date of Birth: 05/29/1941  Today's Date: 08/30/2013  Patient has met 87 of 11 long term goals due to improved activity tolerance, improved balance, postural control, functional use of  RIGHT upper and LEFT upper extremity, improved attention and improved awareness.  Patient to discharge at overall Supervision level.  Patient's care partner is independent to provide the necessary physical and cognitive assistance at discharge.    Reasons goals not met: N/A  Recommendation:  Patient will benefit from ongoing skilled OT services in home health setting to continue to advance functional skills in the area of BADL, iADL and Reduce care partner burden.  Equipment: No equipment provided  Reasons for discharge: treatment goals met and discharge from hospital  Patient/family agrees with progress made and goals achieved: Yes  OT Discharge Precautions/Restrictions  Precautions Precaution Comments: diaylsis port Rt chest General   Vital Signs Therapy Vitals Temp: 98.5 F (36.9 C) Temp src: Oral Pulse Rate: 72 Resp: 18 BP: 166/78 mmHg Patient Position, if appropriate: Lying Oxygen Therapy SpO2: 98 % O2 Device: None (Room air) Pain Pain Assessment Pain Assessment: No/denies pain ADL  See FIM Vision/Perception  Vision - History Baseline Vision: Wears glasses only for reading Visual History: Cataracts Patient Visual Report: Overshooting Vision - Assessment Eye Alignment: Within Functional Limits Vision Assessment: Vision tested Ocular Range of Motion: Within Functional Limits Tracking/Visual Pursuits: Decreased smoothness of horizontal tracking;Decreased smoothness of vertical tracking Saccades: Overshoots;Decreased speed of saccadic movement;Additional head turns occurred during testing Perception Perception: Impaired Inattention/Neglect: Does not attend to right visual field   Cognition Overall Cognitive Status: Impaired/Different from baseline Arousal/Alertness: Awake/alert Orientation Level: Oriented X4 Attention: Selective Safety/Judgment: Appears intact Sensation Sensation Light Touch: Appears Intact Stereognosis: Not tested Hot/Cold: Not tested Proprioception: Appears Intact Coordination Gross Motor Movements are Fluid and Coordinated: No Fine Motor Movements are Fluid and Coordinated: No Finger Nose Finger Test: mild ataxia/impaired coordination BUE, question motor planning vs following directions Extremity/Trunk Assessment RUE Assessment RUE Assessment: Within Functional Limits LUE Assessment LUE Assessment: Within Functional Limits  See FIM for current functional status  Sareena Odeh, [facility] 08/30/2013, 8:30 AM

## 2013-08-30 NOTE — Progress Notes (Signed)
Speech Language Pathology Discharge Summary  Patient Details  Name: [name] MRN: [id number] Date of Birth: 07/27/1940  Today's Date: 08/30/2013 Time: 6256-3893 Time Calculation (min): 25 min  Skilled Therapeutic Interventions:   Skilled treatment session focused on cognitive goals and family education. SLP facilitated session with Min verbal cues for accuracy with medication management task.  Patient requested schedule to see and plan of up coming therapy sessions, which she utilized with Supervision cues.  SLP completed education patient and family member regarding memory compensatory strategies to utilize at home; daughter verbalized understanding of information. Patient and family prepared for discharge.   Patient has met 4 of 4 long term goals.  Patient to discharge at overall Supervision;Modified Independent level.  Reasons goals not met: n/a   Clinical Impression/Discharge Summary: Patient met 4 out of 4 long term goals during her CIR stay due to toleration of diet advancement from Dys.3 nectar-thick liquids with full supervision to regular textures with thin liquids with Mod I.  Patient also made cognitive gains in her recall of new information with use of external aids, problem solving abilities Supervision with basic and Min assist with complex as well as her processing/comprehension abilities of information.  Family education has been completed with her daughters and patient has returned to baseline level of functioning.  As a result, no follow-up skilled SLP are warranted at this time.   Care Partner:  Caregiver Able to Provide Assistance: Yes  Type of Caregiver Assistance: Physical;Cognitive  Recommendation:  24 hour supervision/assistance  Rationale for SLP Follow Up: Other (comment) (n/a)   Equipment: none   Reasons for discharge: Treatment goals met;Discharged from hospital   Patient/Family Agrees with Progress Made and Goals Achieved: Yes   See FIM for current  functional status  Carmelia Roller., CCC-SLP [phone]  [place] 08/30/2013, 9:10 AM

## 2013-08-30 NOTE — Progress Notes (Signed)
Occupational Therapy Session Note  Patient Details  Name: [name] MRN: [id number] Date of Birth: 12/08/1940  Today's Date: 08/30/2013 Time: 0730-0815 Time Calculation (min): 45 min  Short Term Goals: Week 1:  OT Short Term Goal 1 (Week 1): STG = LTGs due to ELOS  Skilled Therapeutic Interventions/Progress Updates:   Completed ADL retraining at overall supervision with use of RW, completing bathing at sit > stand level at sink.  Pt required increased time for orientation of shirt but was able to complete UB dressing this session with 1 orientation cue.  Pt able to complete bathing and dressing at standing level with exception of sitting to don underwear and pants.  Pt much clearer and no reports of dizziness or lightheadedness during session.  Pt's daughter, [name], present during session.  Educated on supervision with mobility and safety with kitchen tasks due to slight Rt inattention and pacing self especially when BP elevated.  Pt and daughter with no further questions.  Therapy Documentation Precautions:  Precautions Precautions: Fall Precaution Comments: diaylsis port Rt chest, IV Rt forearm, restricted Lt UE Restrictions Weight Bearing Restrictions: No General:   Vital Signs: Therapy Vitals Temp: 98.5 F (36.9 C) Temp src: Oral Pulse Rate: 72 Resp: 18 BP: 166/78 mmHg Patient Position, if appropriate: Lying Oxygen Therapy SpO2: 98 % O2 Device: None (Room air) Pain:   Pt with no c/o pain  See FIM for current functional status  Therapy/Group: Individual Therapy  Simonne Come 08/30/2013, 8:17 AM

## 2013-08-30 NOTE — Discharge Instructions (Signed)
Inpatient Rehab Discharge Instructions  [name] Discharge date and time: No discharge date for patient encounter.   Activities/Precautions/ Functional Status: Activity: activity as tolerated Diet: renal diet Wound Care: none needed Functional status:  ___ No restrictions     ___ Walk up steps independently _x__ 24/7 supervision/assistance   ___ Walk up steps with assistance ___ Intermittent supervision/assistance  ___ Bathe/dress independently ___ Walk with walker     ___ Bathe/dress with assistance ___ Walk Independently    ___ Shower independently __x_ Walk with assistance    ___ Shower with assistance ___ No alcohol     ___ Return to work/school ________  Special Instructions:    COMMUNITY REFERRALS UPON DISCHARGE:    Home Health:   PT, OT, [place] [id number] Date of last service:08/30/2013  Medical Equipment/Items Ordered:WHEELCHAIR  Agency/Supplier:[facility]    [phone]  GENERAL COMMUNITY RESOURCES FOR PATIENT/FAMILY: Support Groups:[facility] SUPPORT GROUP    Continue dialysis with [facility] in [place] Cigarette smoking nearly doubles your risk of having a stroke & is the single most alterable risk factor  If you smoke or have smoked in the last 12 months, you are advised to quit smoking for your health.  Most of the excess cardiovascular risk related to smoking disappears within a year of stopping.  Ask you doctor about anti-smoking medications  [place] Quit Line: 1-800-QUIT NOW  Free Smoking Cessation Classes [phone]  CHOLESTEROL Know your levels; limit fat & cholesterol in your diet  Lipid Panel     Component Value Date/Time   CHOL 116 07/21/2013 0720   TRIG 97 07/21/2013 0720   HDL 29* 07/21/2013 0720   CHOLHDL 4.0 07/21/2013 0720   VLDL 19 07/21/2013 0720   LDLCALC 68 07/21/2013 0720      Many patients benefit from treatment even if their cholesterol is at goal.  Goal: Total  Cholesterol (CHOL) less than 160  Goal:  Triglycerides (TRIG) less than 150  Goal:  HDL greater than 40  Goal:  LDL (LDLCALC) less than 100   BLOOD PRESSURE American Stroke Association blood pressure target is less that 120/80 mm/Hg  Your discharge blood pressure is:  BP: 184/88 mmHg  Monitor your blood pressure  Limit your salt and alcohol intake  Many individuals will require more than one medication for high blood pressure  DIABETES (A1c is a blood sugar average for last 3 months) Goal HGBA1c is under 7% (HBGA1c is blood sugar average for last 3 months)  Diabetes:     Lab Results  Component Value Date   HGBA1C 7.1* 07/21/2013     Your HGBA1c can be lowered with medications, healthy diet, and exercise.  Check your blood sugar as directed by your physician  Call your physician if you experience unexplained or low blood sugars.  PHYSICAL ACTIVITY/REHABILITATION Goal is 30 minutes at least 4 days per week  Activity: Increase activity slowly, Therapies: Physical Therapy: Home Health Return to work:   Activity decreases your risk of heart attack and stroke and makes your heart stronger.  It helps control your weight and blood pressure; helps you relax and can improve your mood.  Participate in a regular exercise program.  Talk with your doctor about the best form of exercise for you (dancing, walking, swimming, cycling).  DIET/WEIGHT Goal is to maintain a healthy weight  Your discharge diet is: Diabetic  liquids Your height is:    Your current weight is: Weight: 71.2 kg (  156 lb 15.5 oz) Your Body Mass Index (BMI) is:     Following the type of diet specifically designed for you will help prevent another stroke.  Your goal weight range is:    Your goal Body Mass Index (BMI) is 19-24.  Healthy food habits can help reduce 3 risk factors for stroke:  High cholesterol, hypertension, and excess weight.  RESOURCES Stroke/Support Group:  Call [phone]   STROKE EDUCATION  PROVIDED/REVIEWED AND GIVEN TO PATIENT Stroke warning signs and symptoms How to activate emergency medical system (call 911). Medications prescribed at discharge. Need for follow-up after discharge. Personal risk factors for stroke. Pneumonia vaccine given:  Flu vaccine given:  My questions have been answered, the writing is legible, and I understand these instructions.  I will adhere to these goals & educational materials that have been provided to me after my discharge from the hospital.     [facility]  My questions have been answered and I understand these instructions. I will adhere to these goals and the provided educational materials after my discharge from the hospital.  Patient/Caregiver Signature _______________________________ Date __________  Clinician Signature _______________________________________ Date __________  Please bring this form and your medication list with you to all your follow-up doctor's appointments.

## 2013-08-30 NOTE — Progress Notes (Addendum)
Inpatient Diabetes Program Recommendations  AACE/ADA: New Consensus Statement on Inpatient Glycemic Control (2013)  Target Ranges:  Prepandial:   less than 140 mg/dL      Peak postprandial:   less than 180 mg/dL (1-2 hours)      Critically ill patients:  140 - 180 mg/dL   Results for WILMUTH, OLIVETTI (MRN [id number]) as of 08/30/2013 13:26  Ref. Range 08/29/2013 07:25 08/29/2013 11:45 08/29/2013 18:17 08/29/2013 21:37 08/29/2013 23:13 08/30/2013 07:30 08/30/2013 10:43 08/30/2013 11:28  Glucose-Capillary Latest Range: 70-99 mg/dL 161 (H) 240 (H) 143 (H) 391 (H) 349 (H) 156 (H) 210 (H) 184 (H)   Diabetes history: DM2 Outpatient Diabetes medications: NPH 5 units BID, Novolog 0-9 units AC Current orders for Inpatient glycemic control: NPH 5 units BID, Novolog 0-9 units AC   Inpatient Diabetes Program Recommendations Insulin - Basal: Please consider increasing morning dose of NPH to 7 units QAM and continue evening dose of NPH 5 units QHS at 22:00.  Thanks, Barnie Alderman, RN, MSN, CCRN Diabetes Coordinator Inpatient Diabetes Program [phone] (Team Pager) [phone] ([facility] office) [phone] [facility] office)

## 2013-08-30 NOTE — Progress Notes (Signed)
Social Work Discharge Note Discharge Note  The overall goal for the admission was met for:   Discharge location: [place]  Length of Stay: Yes-7 DAYS  Discharge activity level: Yes-SUPERVISION/MIN LEVEL  Home/community participation: Yes  Services provided included: MD, RD, PT, OT, SLP, RN, CM, TR, Pharmacy and [place]: Private Insurance: [facility]  Follow-up services arranged: Home Health: [facility], DME: [facility] and Patient/Family has no preference for HH/DME agencies  Comments (or additional information):FAMILY HERE DAILY AND ATTENDING THERAPIES WITH PT.  PT TO HAVE 24 HR CARE VIA FAMILY MEMEBERS  Patient/Family verbalized understanding of follow-up arrangements: Yes  Individual responsible for coordination of the follow-up plan: SELF & PRISCILLA-DAUGHTER  Confirmed correct DME delivered: Elease Hashimoto 08/30/2013    Elease Hashimoto

## 2013-08-30 NOTE — Progress Notes (Signed)
Subjective/Complaints: 73 y.o. 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 Admitted 08/19/2013 with right side weakness as well as altered mental status. MRI of the brain showed scattered acute infarcts throughout both cerebral and cerebellar hemispheres. Followup neurology services aspirin discontinued placed on Plavix. A loop recorder in place from previous admission. No further stroke workup indicated. Hemodialysis ongoing as per renal services. Subcutaneous heparin for DVT prophylaxis. Currently maintained on dysphagia 2 nectar thick liquid diet  Discussed CT scan, no acute changes Pt feels good today Pt denies CP, SOB, Abd pain, or vomiting  Review of Systems - Negative except weakness  Objective: Vital Signs: Blood pressure 166/78, pulse 72, temperature 98.5 F (36.9 C), temperature source Oral, resp. rate 18, weight 68.2 kg (150 lb 5.7 oz), SpO2 98.00%. Ct Head Wo Contrast  08/29/2013   CLINICAL DATA:  73 year old female with left side weakness. Increased left side ataxia. Recent bilateral cerebral and cerebellar hemisphere infarcts. Initial encounter. Dialysis patient.  EXAM: CT HEAD WITHOUT CONTRAST  TECHNIQUE: Contiguous axial images were obtained from the base of the skull through the vertex without intravenous contrast.  COMPARISON:  Brain MRI 08/20/2013. Non contrast head CT 08/19/2013 and earlier.  FINDINGS: Stable paranasal sinuses and mastoids, with mild sphenoid mucosal thickening. No acute osseous abnormality identified. Negative orbit and scalp soft tissues. Calcified atherosclerosis at the skull base.  Recent numerous scattered cerebellar and cerebral hemisphere infarcts  demonstrated by MRI are poorly visible by CT. No acute intracranial hemorrhage identified. No ventriculomegaly. Stable basilar cisterns. No midline shift, mass effect, or evidence of intracranial mass lesion. Patchy and confluent cerebral white matter hypodensity appears not significantly changed. Stable dystrophic basal ganglia calcifications. No evidence of cortically based acute infarction identified. No suspicious intracranial vascular hyperdensity.  IMPRESSION: 1. Multiple bilateral cerebral and cerebellar infarcts recently demonstrated on MRI are poorly visible by CT. No hemorrhagic transformation or intracranial mass effect identified. 2. No new intracranial abnormality identified.   Electronically Signed   By: Lars Pinks M.D.   On: 08/29/2013 14:06   Results for orders placed during the hospital encounter of 08/23/13 (from the past 72 hour(s))  GLUCOSE, CAPILLARY     Status: Abnormal   Collection Time    08/27/13 11:55 AM      Result Value Ref Range   Glucose-Capillary 211 (*) 70 - 99 mg/dL   Comment 1 Notify RN    GLUCOSE, CAPILLARY     Status: Abnormal   Collection Time    08/27/13  1:42 PM      Result Value Ref Range   Glucose-Capillary 230 (*) 70 - 99 mg/dL   Comment 1 Notify RN    GLUCOSE, CAPILLARY     Status: Abnormal   Collection Time    08/27/13  8:42 PM      Result Value Ref Range   Glucose-Capillary 126 (*) 70 - 99 mg/dL   Comment 1 Notify RN    GLUCOSE, CAPILLARY     Status: Abnormal   Collection Time    08/28/13  7:50 AM      Result Value Ref Range   Glucose-Capillary 155 (*) 70 - 99 mg/dL  GLUCOSE, CAPILLARY     Status: Abnormal   Collection Time    08/28/13 11:35  AM      Result Value Ref Range   Glucose-Capillary 182 (*) 70 - 99 mg/dL  GLUCOSE, CAPILLARY     Status: Abnormal   Collection Time    08/28/13  4:53 PM      Result Value Ref Range   Glucose-Capillary 303 (*) 70 - 99 mg/dL  GLUCOSE, CAPILLARY     Status: Abnormal   Collection Time    08/28/13  8:30  PM      Result Value Ref Range   Glucose-Capillary 170 (*) 70 - 99 mg/dL  GLUCOSE, CAPILLARY     Status: Abnormal   Collection Time    08/29/13  7:25 AM      Result Value Ref Range   Glucose-Capillary 161 (*) 70 - 99 mg/dL  GLUCOSE, CAPILLARY     Status: Abnormal   Collection Time    08/29/13 11:45 AM      Result Value Ref Range   Glucose-Capillary 240 (*) 70 - 99 mg/dL   Comment 1 Notify RN    CBC     Status: Abnormal   Collection Time    08/29/13  2:52 PM      Result Value Ref Range   WBC 11.7 (*) 4.0 - 10.5 K/uL   RBC 3.22 (*) 3.87 - 5.11 MIL/uL   Hemoglobin 9.3 (*) 12.0 - 15.0 g/dL   HCT 27.6 (*) 36.0 - 46.0 %   MCV 85.7  78.0 - 100.0 fL   MCH 28.9  26.0 - 34.0 pg   MCHC 33.7  30.0 - 36.0 g/dL   RDW 16.9 (*) 11.5 - 15.5 %   Platelets 213  150 - 400 K/uL  RENAL FUNCTION PANEL     Status: Abnormal   Collection Time    08/29/13  2:52 PM      Result Value Ref Range   Sodium 129 (*) 137 - 147 mEq/L   Potassium 4.2  3.7 - 5.3 mEq/L   Chloride 88 (*) 96 - 112 mEq/L   CO2 26  19 - 32 mEq/L   Glucose, Bld 293 (*) 70 - 99 mg/dL   BUN 33 (*) 6 - 23 mg/dL   Creatinine, Ser 5.11 (*) 0.50 - 1.10 mg/dL   Calcium 8.8  8.4 - 10.5 mg/dL   Phosphorus 3.6  2.3 - 4.6 mg/dL   Albumin 3.0 (*) 3.5 - 5.2 g/dL   GFR calc non Af Amer 8 (*) >90 mL/min   GFR calc Af Amer 9 (*) >90 mL/min   Comment: (NOTE)     The eGFR has been calculated using the CKD EPI equation.     This calculation has not been validated in all clinical situations.     eGFR's persistently <90 mL/min signify possible Chronic Kidney     Disease.  GLUCOSE, CAPILLARY     Status: Abnormal   Collection Time    08/29/13  6:17 PM      Result Value Ref Range   Glucose-Capillary 143 (*) 70 - 99 mg/dL  GLUCOSE, CAPILLARY     Status: Abnormal   Collection Time    08/29/13  9:37 PM      Result Value Ref Range   Glucose-Capillary 391 (*) 70 - 99 mg/dL  GLUCOSE, CAPILLARY     Status: Abnormal   Collection Time    08/29/13  11:13 PM      Result Value Ref Range   Glucose-Capillary 349 (*) 70 - 99 mg/dL  GLUCOSE, CAPILLARY  Status: Abnormal   Collection Time    08/30/13  7:30 AM      Result Value Ref Range   Glucose-Capillary 156 (*) 70 - 99 mg/dL   Comment 1 Notify RN        Physical Exam  HENT:  Head: Normocephalic.  Eyes: EOM are normal.  Neck: Normal range of motion. Neck supple. No thyromegaly present.  Cardiovascular: Normal rate and regular rhythm.  Respiratory: Effort normal and breath sounds normal. No respiratory distress.  GI: Soft. Bowel sounds are normal. She exhibits no distension.  Neurological: She is alert.  Patient was able to provide her name, age and date of birth. She followed simple commands. Ataxic LE mod, UE mild. Mild weakness 5/5 RUE, 5/5 LUE , 5/5 in LE's  Skin: Skin is warm and mild diaphoresis  Psychiatric: She has a normal mood and affect. Her behavior is normal Gen NAD  Assessment/Plan: 1. Functional deficits secondary to bicerebral and cerebellar embolic infarcts Stable for D/C today F/u PCP in 1-2 weeks F/u PM&R 3 weeks See D/C summary See D/C instructions FIM: FIM - Bathing Bathing Steps Patient Completed: Chest;Right Arm;Left Arm;Abdomen;Front perineal area;Buttocks;Right upper leg;Left upper leg;Right lower leg (including foot);Left lower leg (including foot) Bathing: 5: Supervision: Safety issues/verbal cues  FIM - Upper Body Dressing/Undressing Upper body dressing/undressing steps patient completed: Thread/unthread left sleeve of pullover shirt/dress;Put head through opening of pull over shirt/dress;Pull shirt over trunk;Thread/unthread right sleeve of pullover shirt/dresss Upper body dressing/undressing: 5: Supervision: Safety issues/verbal cues FIM - Lower Body Dressing/Undressing Lower body dressing/undressing steps patient completed: Thread/unthread right underwear leg;Thread/unthread left underwear leg;Pull underwear up/down;Thread/unthread right  pants leg;Thread/unthread left pants leg;Pull pants up/down;Don/Doff right shoe;Don/Doff left shoe;Fasten/unfasten right shoe;Fasten/unfasten left shoe Lower body dressing/undressing: 5: Supervision: Safety issues/verbal cues     FIM - Radio producer Devices: Walker;Grab bars Toilet Transfers: 5-To toilet/BSC: Supervision (verbal cues/safety issues);4-From toilet/BSC: Min A (steadying Pt. > 75%)  FIM - Bed/Chair Transfer Bed/Chair Transfer Assistive Devices: Copy: 5: Supine > Sit: Supervision (verbal cues/safety issues);5: Sit > Supine: Supervision (verbal cues/safety issues);5: Bed > Chair or W/C: Supervision (verbal cues/safety issues);5: Chair or W/C > Bed: Supervision (verbal cues/safety issues)  FIM - Locomotion: Wheelchair Distance: 150 in controlled environment initially with bilat LE propulsion and then switching to UE propulsion with verbal and visual cues for sequencing and attention to RUE  Locomotion: Wheelchair: 0: Activity did not occur FIM - Locomotion: Ambulation Locomotion: Ambulation Assistive Devices: Administrator Ambulation/Gait Assistance: 5: Supervision Locomotion: Ambulation: 5: Travels 150 ft or more with supervision/safety issues  Comprehension Comprehension Mode: Auditory Comprehension: 5-Understands complex 90% of the time/Cues < 10% of the time  Expression Expression Mode: Verbal Expression: 5-Expresses basic needs/ideas: With no assist  Social Interaction Social Interaction: 5-Interacts appropriately 90% of the time - Needs monitoring or encouragement for participation or interaction.  Problem Solving Problem Solving: 4-Solves basic 75 - 89% of the time/requires cueing 10 - 24% of the time  Memory Memory: 4-Recognizes or recalls 75 - 89% of the time/requires cueing 10 - 24% of the time  Medical Problem List and Plan:  1. Bi cerebral infarcts  2. DVT Prophylaxis/Anticoagulation: Subcutaneous  heparin. Monitor platelet counts and any signs of bleeding  3. Pain Management: Tylenol as needed  4. Neuropsych: This patient is capable of making decisions on her own behalf.  5. Dysphagia. Dysphagia 2 nectar liquids. Followup speech therapy  6. End stage renal disease with hemodialysis. Followup per renal  services  7. Diabetes mellitus with peripheral neuropathy. Hemoglobin A1c 7.1. Insulin NPH 5 units twice a day. Check blood sugars a.c. and at bedtime  8. Hypertension.Hi dose labetolol, prn clonidine, will defer to renal for management.9. Chronic anemia. Continue Aranesp per Renal. Followup labs with dialysis   LOS (Days) 7 A FACE TO FACE EVALUATION WAS PERFORMED  Domonique Cothran E 08/30/2013, 7:34 AM

## 2013-08-31 ENCOUNTER — Telehealth: Payer: Self-pay

## 2013-08-31 ENCOUNTER — Other Ambulatory Visit: Payer: Self-pay

## 2013-08-31 NOTE — Telephone Encounter (Signed)
Per pharmacy they needed clarification on protonix prescription.  It was written for liquid, verbally changed to tablets.  Pharmacy aware.

## 2013-08-31 NOTE — Telephone Encounter (Signed)
[facility] pharmacy called to get clarification on a medication but did not state which one.

## 2013-09-06 ENCOUNTER — Encounter (HOSPITAL_COMMUNITY): Payer: Self-pay | Admitting: Pharmacy Technician

## 2013-09-07 ENCOUNTER — Encounter (HOSPITAL_COMMUNITY): Payer: Self-pay | Admitting: *Deleted

## 2013-09-07 MED ORDER — SODIUM CHLORIDE 0.9 % IV SOLN
INTRAVENOUS | Status: DC
  Administered 2013-09-08: 08:00:00 via INTRAVENOUS

## 2013-09-07 MED ORDER — DEXTROSE 5 % IV SOLN
1.5000 g | INTRAVENOUS | Status: AC
  Administered 2013-09-08: 1.5 g via INTRAVENOUS
  Filled 2013-09-07: qty 1.5

## 2013-09-07 NOTE — Progress Notes (Signed)
Pt has Loop Recorder Implant. According to pt [name], pt was instructed to take Labetalol, Protonix and Plavix on DOS.

## 2013-09-08 ENCOUNTER — Encounter (HOSPITAL_COMMUNITY): Payer: Medicare Other | Admitting: Anesthesiology

## 2013-09-08 ENCOUNTER — Ambulatory Visit (HOSPITAL_COMMUNITY): Payer: Medicare Other | Admitting: Anesthesiology

## 2013-09-08 ENCOUNTER — Encounter (HOSPITAL_COMMUNITY): Payer: Self-pay | Admitting: *Deleted

## 2013-09-08 ENCOUNTER — Ambulatory Visit (HOSPITAL_COMMUNITY)
Admission: RE | Admit: 2013-09-08 | Discharge: 2013-09-08 | Disposition: A | Discharge status: Home / Self Care | Payer: Medicare Other | Source: Ambulatory Visit | Attending: Vascular Surgery | Admitting: Vascular Surgery

## 2013-09-08 ENCOUNTER — Other Ambulatory Visit: Payer: Self-pay | Admitting: *Deleted

## 2013-09-08 ENCOUNTER — Encounter (HOSPITAL_COMMUNITY)
Admission: RE | Disposition: A | Discharge status: Home / Self Care | Payer: Self-pay | Source: Ambulatory Visit | Attending: Vascular Surgery

## 2013-09-08 DIAGNOSIS — Z794 Long term (current) use of insulin: Secondary | ICD-10-CM | POA: Insufficient documentation

## 2013-09-08 DIAGNOSIS — I120 Hypertensive chronic kidney disease with stage 5 chronic kidney disease or end stage renal disease: Secondary | ICD-10-CM | POA: Insufficient documentation

## 2013-09-08 DIAGNOSIS — N186 End stage renal disease: Secondary | ICD-10-CM

## 2013-09-08 DIAGNOSIS — E119 Type 2 diabetes mellitus without complications: Secondary | ICD-10-CM | POA: Insufficient documentation

## 2013-09-08 DIAGNOSIS — D649 Anemia, unspecified: Secondary | ICD-10-CM | POA: Insufficient documentation

## 2013-09-08 DIAGNOSIS — Z4931 Encounter for adequacy testing for hemodialysis: Secondary | ICD-10-CM

## 2013-09-08 HISTORY — PX: AV FISTULA PLACEMENT: SHX1204

## 2013-09-08 LAB — GLUCOSE, CAPILLARY
GLUCOSE-CAPILLARY: 240 mg/dL — AB (ref 70–99)
Glucose-Capillary: 244 mg/dL — ABNORMAL HIGH (ref 70–99)
Glucose-Capillary: 249 mg/dL — ABNORMAL HIGH (ref 70–99)

## 2013-09-08 LAB — POCT I-STAT 4, (NA,K, GLUC, HGB,HCT)
Glucose, Bld: 247 mg/dL — ABNORMAL HIGH (ref 70–99)
HCT: 37 % (ref 36.0–46.0)
Hemoglobin: 12.6 g/dL (ref 12.0–15.0)
Potassium: 4.1 mEq/L (ref 3.7–5.3)
Sodium: 142 mEq/L (ref 137–147)

## 2013-09-08 SURGERY — ARTERIOVENOUS (AV) FISTULA CREATION
Anesthesia: Monitor Anesthesia Care | Site: Arm Upper | Laterality: Right

## 2013-09-08 MED ORDER — THROMBIN 20000 UNITS EX SOLR
CUTANEOUS | Status: AC
  Filled 2013-09-08: qty 20000

## 2013-09-08 MED ORDER — OXYCODONE HCL 5 MG PO TABS: 5.0000 mg | ORAL_TABLET | Freq: Four times a day (QID) | ORAL | Status: DC | PRN

## 2013-09-08 MED ORDER — LIDOCAINE-EPINEPHRINE (PF) 1 %-1:200000 IJ SOLN
INTRAMUSCULAR | Status: DC | PRN
  Administered 2013-09-08: 10 mL

## 2013-09-08 MED ORDER — MIDAZOLAM HCL 2 MG/2ML IJ SOLN
INTRAMUSCULAR | Status: AC
  Filled 2013-09-08: qty 2

## 2013-09-08 MED ORDER — PROPOFOL 10 MG/ML IV BOLUS
INTRAVENOUS | Status: AC
  Filled 2013-09-08: qty 20

## 2013-09-08 MED ORDER — FENTANYL CITRATE 0.05 MG/ML IJ SOLN
INTRAMUSCULAR | Status: AC
  Filled 2013-09-08: qty 5

## 2013-09-08 MED ORDER — SODIUM CHLORIDE 0.9 % IR SOLN
Status: DC | PRN
  Administered 2013-09-08: 10:00:00

## 2013-09-08 MED ORDER — MIDAZOLAM HCL 5 MG/5ML IJ SOLN
INTRAMUSCULAR | Status: DC | PRN
  Administered 2013-09-08: 2 mg via INTRAVENOUS

## 2013-09-08 MED ORDER — PROPOFOL 10 MG/ML IV BOLUS
INTRAVENOUS | Status: DC | PRN
  Administered 2013-09-08 (×7): 20 mg via INTRAVENOUS

## 2013-09-08 MED ORDER — METOPROLOL TARTRATE 1 MG/ML IV SOLN
INTRAVENOUS | Status: AC
  Filled 2013-09-08: qty 5

## 2013-09-08 MED ORDER — 0.9 % SODIUM CHLORIDE (POUR BTL) OPTIME
TOPICAL | Status: DC | PRN
  Administered 2013-09-08: 1000 mL

## 2013-09-08 MED ORDER — LABETALOL HCL 5 MG/ML IV SOLN
INTRAVENOUS | Status: DC | PRN
  Administered 2013-09-08: 5 mg via INTRAVENOUS

## 2013-09-08 MED ORDER — LABETALOL HCL 5 MG/ML IV SOLN
INTRAVENOUS | Status: AC
  Filled 2013-09-08: qty 4

## 2013-09-08 MED ORDER — SODIUM CHLORIDE 0.9 % IV SOLN
INTRAVENOUS | Status: DC | PRN
  Administered 2013-09-08: 10:00:00 via INTRAVENOUS

## 2013-09-08 MED ORDER — PROPOFOL INFUSION 10 MG/ML OPTIME
INTRAVENOUS | Status: DC | PRN
  Administered 2013-09-08: 50 ug/kg/min via INTRAVENOUS

## 2013-09-08 MED ORDER — LIDOCAINE HCL (CARDIAC) 20 MG/ML IV SOLN
INTRAVENOUS | Status: DC | PRN
  Administered 2013-09-08: 60 mg via INTRAVENOUS

## 2013-09-08 MED ORDER — LIDOCAINE HCL (CARDIAC) 20 MG/ML IV SOLN
INTRAVENOUS | Status: AC
  Filled 2013-09-08: qty 5

## 2013-09-08 MED ORDER — FENTANYL CITRATE 0.05 MG/ML IJ SOLN
INTRAMUSCULAR | Status: DC | PRN
  Administered 2013-09-08: 50 ug via INTRAVENOUS
  Administered 2013-09-08 (×4): 25 ug via INTRAVENOUS

## 2013-09-08 SURGICAL SUPPLY — 39 items
ARMBAND PINK RESTRICT EXTREMIT (MISCELLANEOUS) ×3 IMPLANT
CANISTER SUCTION 2500CC (MISCELLANEOUS) ×3 IMPLANT
CATH EMB 3FR 80CM (CATHETERS) ×3 IMPLANT
CLIP TI MEDIUM 6 (CLIP) ×3 IMPLANT
CLIP TI WIDE RED SMALL 6 (CLIP) ×3 IMPLANT
COVER PROBE W GEL 5X96 (DRAPES) ×3 IMPLANT
COVER SURGICAL LIGHT HANDLE (MISCELLANEOUS) ×3 IMPLANT
DECANTER SPIKE VIAL GLASS SM (MISCELLANEOUS) ×3 IMPLANT
DERMABOND ADVANCED (GAUZE/BANDAGES/DRESSINGS) ×2
DERMABOND ADVANCED .7 DNX12 (GAUZE/BANDAGES/DRESSINGS) ×1 IMPLANT
DRAIN PENROSE 1/4X12 LTX STRL (WOUND CARE) ×3 IMPLANT
ELECT REM PT RETURN 9FT ADLT (ELECTROSURGICAL) ×3
ELECTRODE REM PT RTRN 9FT ADLT (ELECTROSURGICAL) ×1 IMPLANT
GEL ULTRASOUND 20GR AQUASONIC (MISCELLANEOUS) IMPLANT
GLOVE BIO SURGEON STRL SZ 6.5 (GLOVE) ×4 IMPLANT
GLOVE BIO SURGEONS STRL SZ 6.5 (GLOVE) ×2
GLOVE BIOGEL PI IND STRL 6 (GLOVE) ×1 IMPLANT
GLOVE BIOGEL PI IND STRL 7.0 (GLOVE) ×3 IMPLANT
GLOVE BIOGEL PI INDICATOR 6 (GLOVE) ×2
GLOVE BIOGEL PI INDICATOR 7.0 (GLOVE) ×6
GLOVE SS BIOGEL STRL SZ 7 (GLOVE) ×1 IMPLANT
GLOVE SUPERSENSE BIOGEL SZ 7 (GLOVE) ×2
GOWN STRL REUS W/ TWL LRG LVL3 (GOWN DISPOSABLE) ×3 IMPLANT
GOWN STRL REUS W/ TWL XL LVL3 (GOWN DISPOSABLE) ×1 IMPLANT
GOWN STRL REUS W/TWL LRG LVL3 (GOWN DISPOSABLE) ×6
GOWN STRL REUS W/TWL XL LVL3 (GOWN DISPOSABLE) ×2
KIT BASIN OR (CUSTOM PROCEDURE TRAY) ×3 IMPLANT
KIT ROOM TURNOVER OR (KITS) ×3 IMPLANT
NS IRRIG 1000ML POUR BTL (IV SOLUTION) ×3 IMPLANT
PACK CV ACCESS (CUSTOM PROCEDURE TRAY) ×3 IMPLANT
PAD ARMBOARD 7.5X6 YLW CONV (MISCELLANEOUS) ×6 IMPLANT
SUT PROLENE 6 0 BV (SUTURE) ×3 IMPLANT
SUT VIC AB 3-0 SH 27 (SUTURE) ×2
SUT VIC AB 3-0 SH 27X BRD (SUTURE) ×1 IMPLANT
SYR TB 1ML 26GX3/8 SAFETY (SYRINGE) ×3 IMPLANT
TOWEL OR 17X24 6PK STRL BLUE (TOWEL DISPOSABLE) ×3 IMPLANT
TOWEL OR 17X26 10 PK STRL BLUE (TOWEL DISPOSABLE) ×3 IMPLANT
UNDERPAD 30X30 INCONTINENT (UNDERPADS AND DIAPERS) ×3 IMPLANT
WATER STERILE IRR 1000ML POUR (IV SOLUTION) IMPLANT

## 2013-09-08 NOTE — Interval H&P Note (Signed)
History and Physical Interval Note:  09/08/2013 9:23 AM  [name]  has presented today for surgery, with the diagnosis of End Stage Renal Disease  The various methods of treatment have been discussed with the patient and family. After consideration of risks, benefits and other options for treatment, the patient has consented to  Procedure(s): ARTERIOVENOUS (AV) FISTULA CREATION-RIGHT BRACHIAL CEPHALIC (Right) as a surgical intervention .  The patient's history has been reviewed, patient examined, no change in status, stable for surgery.  I have reviewed the patient's chart and labs.  Questions were answered to the patient's satisfaction.     Tinnie Gens

## 2013-09-08 NOTE — Anesthesia Postprocedure Evaluation (Signed)
  Anesthesia Post-op Note  Patient: [name]  Procedure(s) Performed: Procedure(s): CREATION OF RIGHT BRACHIAL CEPHALIC ARTERIOVENOUS FISTULA  (Right)  Patient Location: PACU  Anesthesia Type:MAC  Level of Consciousness: awake and alert   Airway and Oxygen Therapy: Patient Spontanous Breathing  Post-op Pain: mild  Post-op Assessment: Post-op Vital signs reviewed  Post-op Vital Signs: stable  Complications: No apparent anesthesia complications

## 2013-09-08 NOTE — H&P (View-Only) (Signed)
Patient ID: [name], female   DOB: 02/14/1941, 73 y.o.   MRN: [id number] Vein mapping reviewed-best cephalic vein is in the right upper extremity in the upper arm  Will schedule right brachial cephalic AV fistula in the next few weeks as an outpatient. Discussed this with patient and her [name] and they are in agreement

## 2013-09-08 NOTE — Discharge Instructions (Signed)
° ° °  09/08/2013 WILLARD NORDELL [id number] 1940-10-14  Surgeon(s): Mal Misty, MD  Procedure(s): CREATION OF RIGHT RADIO-CEPHALIC ARTERIOVENOUS FISTULA   x Do not stick graft for 12 weeks   What to eat:  For your first meals, you should eat lightly; only small meals initially.  If you do not have nausea, you may eat larger meals.  Avoid spicy, greasy and heavy food.    General Anesthesia, Adult, Care After  Refer to this sheet in the next few weeks. These instructions provide you with information on caring for yourself after your procedure. Your health care provider may also give you more specific instructions. Your treatment has been planned according to current medical practices, but problems sometimes occur. Call your health care provider if you have any problems or questions after your procedure.  WHAT TO EXPECT AFTER THE PROCEDURE  After the procedure, it is typical to experience:  Sleepiness.  Nausea and vomiting. HOME CARE INSTRUCTIONS  For the first 24 hours after general anesthesia:  Have a responsible person with you.  Do not drive a car. If you are alone, do not take public transportation.  Do not drink alcohol.  Do not take medicine that has not been prescribed by your health care provider.  Do not sign important papers or make important decisions.  You may resume a normal diet and activities as directed by your health care provider.  Change bandages (dressings) as directed.  If you have questions or problems that seem related to general anesthesia, call the hospital and ask for the anesthetist or anesthesiologist on call. SEEK MEDICAL CARE IF:  You have nausea and vomiting that continue the day after anesthesia.  You develop a rash. SEEK IMMEDIATE MEDICAL CARE IF:  You have difficulty breathing.  You have chest pain.  You have any allergic problems. Document Released: 09/08/2000 Document Revised: 02/02/2013 Document Reviewed: 12/16/2012  [facility] Patient  Information 2014 [place], [place].

## 2013-09-08 NOTE — Anesthesia Preprocedure Evaluation (Signed)
Anesthesia Evaluation  Patient identified by MRN, date of birth, ID band Patient awake    Reviewed: Allergy & Precautions, H&P , NPO status , Patient's Chart, lab work & pertinent test results, reviewed documented beta blocker date and time   History of Anesthesia Complications Negative for: history of anesthetic complications  Airway Mallampati: I TM Distance: >3 FB Neck ROM: Full    Dental no notable dental hx. (+) Edentulous Upper, Edentulous Lower, Dental Advisory Given   Pulmonary neg pulmonary ROS,  breath sounds clear to auscultation  Pulmonary exam normal       Cardiovascular hypertension, On Medications and On Home Beta Blockers Rhythm:Regular Rate:Normal     Neuro/Psych  Neuromuscular disease negative neurological ROS  negative psych ROS   GI/Hepatic negative GI ROS, Neg liver ROS,   Endo/Other  diabetes, Type 1, Insulin Dependent  Renal/GU ESRFRenal disease     Musculoskeletal   Abdominal   Peds  Hematology negative hematology ROS (+) anemia ,   Anesthesia Other Findings   Reproductive/Obstetrics negative OB ROS                           Anesthesia Physical Anesthesia Plan  ASA: III  Anesthesia Plan: MAC   Post-op Pain Management:    Induction:   Airway Management Planned: Natural Airway  Additional Equipment:   Intra-op Plan:   Post-operative Plan:   Informed Consent:   Plan Discussed with: CRNA and Surgeon  Anesthesia Plan Comments:         Anesthesia Quick Evaluation

## 2013-09-08 NOTE — Progress Notes (Signed)
Dr Trula Slade at bedside to evaluate ? Hematoma in forearm at fistula site.  Site measures 17 cm at the widest part.  MD ordered that we continue to monitor her for one hour, and if unchanged we can discharge her .

## 2013-09-08 NOTE — Progress Notes (Signed)
Dr. Orene Desanctis called and informed of abnormal 2 view CXR done on 08/19/13, no need to repeat per him.

## 2013-09-08 NOTE — Preoperative (Signed)
Beta Blockers   Reason not to administer Beta Blockers:Not Applicable 

## 2013-09-08 NOTE — Transfer of Care (Signed)
Immediate Anesthesia Transfer of Care Note  Patient: [name]  Procedure(s) Performed: Procedure(s): CREATION OF RIGHT BRACHIAL CEPHALIC ARTERIOVENOUS FISTULA  (Right)  Patient Location: PACU  Anesthesia Type:MAC  Level of Consciousness: awake and alert   Airway & Oxygen Therapy: Patient Spontanous Breathing and Patient connected to face mask oxygen  Post-op Assessment: Report given to PACU RN and Post -op Vital signs reviewed and stable  Post vital signs: Reviewed and stable  Complications: No apparent anesthesia complications

## 2013-09-08 NOTE — Op Note (Signed)
OPERATIVE REPORT  Date of Surgery: 09/08/2013  Surgeon: Tinnie Gens, MD  Assistant: Dionicio Stall  Pre-op Diagnosis: End Stage Renal Disease  Post-op Diagnosis: End Stage Renal Disease  Procedure: Procedure(s): CREATION OF right radial-cephalic AV fistula  Anesthesia: MAC  EBL: Minimal  Complications: None  Procedure Details: The patient was taken to the operating room placed in supine position at which time the right upper extremity was prepped Betadine scrub and solution draped in routine sterile manner. Using a tourniquet the veins in the right upper extremities were then imaged using the SonoSite ultrasound. The cephalic vein in the forearm appeared adequate for fistula creation. After infiltration of Xylocaine with epinephrine a short longitudinal incision was made between the radial artery and cephalic vein just proximal to the wrist. Cephalic vein was dissected free encircled with vessel loops. There were 2 large branches extending laterally which were ligated with 3-0 silk ties and divided. Vein was ligated distally transected and gently dilated with heparinized saline. It was about 2-1/2-3 mm vein at the wrist. Fogarty catheter was then passed proximally and could be palpated up to the antecubital area with the vein felt to be satisfactory but borderline. Radial artery was exposed and the fascia was normal-appearing vessel which is 2-1/2 mm in size with excellent pulse. No heparin was given. Artery was occluded proximally and distally with Vesseloops a 15 blade extended with Potts scissors. The except of 2 and half and 3 mm dilator proximally. The vein was carefully measured spatulated and anastomosed end to side with 6-0 Prolene. Loops were released and there was a good pulse and palpable thrill up to the antecubital area with good Doppler flow. Hemostasis was achieved wound closed in layers with Vicryl in a subcuticular fashion with Dermabond patient recovery in stable  condition   Tinnie Gens, MD 09/08/2013 10:50 AM

## 2013-09-09 ENCOUNTER — Encounter (HOSPITAL_COMMUNITY): Payer: Self-pay | Admitting: Vascular Surgery

## 2013-09-09 ENCOUNTER — Telehealth: Payer: Self-pay | Admitting: Vascular Surgery

## 2013-09-09 NOTE — Telephone Encounter (Signed)
S/p right radiocephalic AVF [id number].  F/u with dr. Kellie Simmering in 6 weeks with a duplex.  l/m with patient's son.  fu appt. with dr. Kellie Simmering on 11-08-13 3pm

## 2013-09-20 ENCOUNTER — Ambulatory Visit (INDEPENDENT_AMBULATORY_CARE_PROVIDER_SITE_OTHER): Payer: Medicare Other | Admitting: *Deleted

## 2013-09-20 DIAGNOSIS — I639 Cerebral infarction, unspecified: Secondary | ICD-10-CM

## 2013-09-20 DIAGNOSIS — I6350 Cerebral infarction due to unspecified occlusion or stenosis of unspecified cerebral artery: Secondary | ICD-10-CM

## 2013-09-29 ENCOUNTER — Ambulatory Visit (HOSPITAL_BASED_OUTPATIENT_CLINIC_OR_DEPARTMENT_OTHER): Payer: Medicare Other | Admitting: Physical Medicine & Rehabilitation

## 2013-09-29 ENCOUNTER — Encounter: Payer: Self-pay | Admitting: Physical Medicine & Rehabilitation

## 2013-09-29 ENCOUNTER — Encounter: Payer: Medicare Other | Attending: Physical Medicine & Rehabilitation

## 2013-09-29 VITALS — BP 137/68 | HR 82 | Resp 14 | Ht 67.0 in | Wt 154.0 lb

## 2013-09-29 DIAGNOSIS — I6350 Cerebral infarction due to unspecified occlusion or stenosis of unspecified cerebral artery: Secondary | ICD-10-CM

## 2013-09-29 DIAGNOSIS — I639 Cerebral infarction, unspecified: Secondary | ICD-10-CM

## 2013-09-29 DIAGNOSIS — I69993 Ataxia following unspecified cerebrovascular disease: Secondary | ICD-10-CM | POA: Insufficient documentation

## 2013-09-29 DIAGNOSIS — I129 Hypertensive chronic kidney disease with stage 1 through stage 4 chronic kidney disease, or unspecified chronic kidney disease: Secondary | ICD-10-CM | POA: Insufficient documentation

## 2013-09-29 DIAGNOSIS — N189 Chronic kidney disease, unspecified: Secondary | ICD-10-CM | POA: Insufficient documentation

## 2013-09-29 NOTE — Progress Notes (Signed)
Subjective:    Patient ID: [name], female    DOB: 03/29/1941, 73 y.o.   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 Still walking with a walker. Did not use a walker prior to her stroke Had 2 or 3 visits with OT. Independently dressing and bathing  No falls at home Pain Inventory Average Pain 0 Pain Right Now 0 My pain is burning and tingling  In the last 24 hours, has pain interfered with the following? General activity 8 Relation with others 8 Enjoyment of life 8 What TIME of day is your pain at its worst? evening Sleep (in general) Fair  Pain is worse with: n/a Pain improves with: n/a Relief from Meds: 0  Mobility use a walker how many minutes can you walk? 30 ability to climb steps?  yes do you drive?  no Do you have any goals in this area?  yes  Function retired  Neuro/Psych confusion  Prior Studies Any changes since last visit?  no  Physicians involved in your care Any changes since last visit?  no   Family History  Problem Relation Age of Onset  . Anesthesia problems Neg Hx   . Hypotension Neg Hx   . Malignant hyperthermia Neg Hx   . Pseudochol deficiency Neg Hx   . Cancer Sister   . Cancer Brother    History   Social History  . Marital Status: Widowed    Spouse Name: N/A    Number of Children: N/A  . Years of Education: N/A   Social History Main Topics  . Smoking status: Never Smoker   . Smokeless tobacco: Never Used  . Alcohol Use: No  . Drug Use: No  . Sexual Activity: None   Other Topics Concern  . None   Social History Narrative  . None   Past Surgical History  Procedure Laterality Date  . Cataract extraction w/phaco  11/20/2011    Procedure: CATARACT EXTRACTION PHACO AND INTRAOCULAR LENS PLACEMENT (IOC);  Surgeon: Tonny Branch, MD;  Location: [facility];  Service: Ophthalmology;  Laterality: Right;   CDE 18.82  . Cataract extraction w/phaco Left 11/18/2012    Procedure: CATARACT EXTRACTION PHACO AND INTRAOCULAR LENS PLACEMENT (IOC);  Surgeon: Tonny Branch, MD;  Location: [facility];  Service: Ophthalmology;  Laterality: Left;  CDE: 18.97  . Colonoscopy N/A 02/09/2013    Procedure: COLONOSCOPY;  Surgeon: Rogene Houston, MD;  Location: [facility];  Service: Endoscopy;  Laterality: N/A;  305-moved to 220 Ann to notify pt  . Insertion of dialysis catheter Right 06/24/2013    Procedure: INSERTION OF DIALYSIS CATHETER: Ultrasound guided;  Surgeon: Serafina Mitchell, MD;  Location: [place];  Service: Vascular;  Laterality: Right;  . 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 Tee without cardioversion N/A 07/21/2013    Procedure: TRANSESOPHAGEAL ECHOCARDIOGRAM (TEE);  Surgeon: Dorothy Spark, MD;  Location: [place];  Service: Cardiovascular;  Laterality: N/A;  . Av fistula placement Right 09/08/2013    Procedure: CREATION OF RIGHT BRACHIAL CEPHALIC ARTERIOVENOUS FISTULA ;  Surgeon: Mal Misty, MD;  Location: [place];  Service: Vascular;  Laterality: Right;   Past Medical History  Diagnosis Date  . Hypertension   . Cataract   . Diabetes mellitus   . Chronic kidney disease   .  Shortness of breath   . Anemia   . CVA (cerebral infarction)    BP 137/68  Pulse 82  Resp 14  Ht 5\' 7"  (1.702 m)  Wt 154 lb (69.854 kg)  BMI 24.11 kg/m2  SpO2 99%  Opioid Risk Score:   Fall Risk Score: High Fall Risk (>13 points) (patient educated handout declined)   Review of Systems  Musculoskeletal: Positive for gait problem.  Psychiatric/Behavioral: Positive for confusion.  All other systems reviewed and are negative.      Objective:   Physical Exam  Motor strength is 5/5 in bilateral deltoid, bicep, tricep, grip, hip flexor, knee extensors, ankle dorsiflexor  Gait is using a walker. No evidence of toe drag or knee instability Can walk with help,  hand-held  Mood and affect are appropriate Speech without dysarthria or aphasia     Assessment & Plan:  #1. Bicerebral infarcts making good progress with home health physical therapy however not back to baseline. Did not use assistive device prior to CVA now uses walker. Make referral to outpatient physical therapy with operating to cane or no assistive device

## 2013-09-29 NOTE — Patient Instructions (Signed)
Start outpt therapy at [facility]

## 2013-10-02 LAB — MDC_IDC_ENUM_SESS_TYPE_REMOTE

## 2013-10-13 ENCOUNTER — Ambulatory Visit (HOSPITAL_COMMUNITY)
Admission: RE | Admit: 2013-10-13 | Discharge: 2013-10-13 | Disposition: A | Discharge status: Home / Self Care | Payer: Medicare Other | Source: Ambulatory Visit | Attending: Physical Medicine & Rehabilitation | Admitting: Physical Medicine & Rehabilitation

## 2013-10-13 DIAGNOSIS — R262 Difficulty in walking, not elsewhere classified: Secondary | ICD-10-CM | POA: Insufficient documentation

## 2013-10-13 DIAGNOSIS — R209 Unspecified disturbances of skin sensation: Secondary | ICD-10-CM | POA: Insufficient documentation

## 2013-10-13 DIAGNOSIS — IMO0001 Reserved for inherently not codable concepts without codable children: Secondary | ICD-10-CM | POA: Insufficient documentation

## 2013-10-13 DIAGNOSIS — I10 Essential (primary) hypertension: Secondary | ICD-10-CM | POA: Insufficient documentation

## 2013-10-13 DIAGNOSIS — E119 Type 2 diabetes mellitus without complications: Secondary | ICD-10-CM | POA: Insufficient documentation

## 2013-10-13 DIAGNOSIS — I69959 Hemiplegia and hemiparesis following unspecified cerebrovascular disease affecting unspecified side: Secondary | ICD-10-CM | POA: Insufficient documentation

## 2013-10-13 NOTE — Evaluation (Signed)
Physical Therapy Evaluation  Patient Details  Name: [name] MRN: [id number] Date of Birth: [birth year]  Today's Date: 10/13/2013 Time: 1015-1100 PT Time Calculation (min): 45 min   Charges: 1 Evaluation, 1035-1100 Therapeutic activities          Visit#: 1 of 12  Re-eval: 11/12/13 Assessment Diagnosis: Rt  LE weakness following CVA Next MD Visit: Dr. [name] once a month, unsure when.   Authorization: Medicare    Authorization Time Period:    Authorization Visit#: 1 of 12   Past Medical History:  Past Medical History  Diagnosis Date  . Hypertension   . Cataract   . Diabetes mellitus   . Chronic kidney disease   . Shortness of breath   . Anemia   . CVA (cerebral infarction)    Past Surgical History:  Past Surgical History  Procedure Laterality Date  . Cataract extraction w/phaco  11/20/2011    Procedure: CATARACT EXTRACTION PHACO AND INTRAOCULAR LENS PLACEMENT (IOC);  Surgeon: Tonny Branch, MD;  Location: [facility];  Service: Ophthalmology;  Laterality: Right;  CDE 18.82  . Cataract extraction w/phaco Left 11/18/2012    Procedure: CATARACT EXTRACTION PHACO AND INTRAOCULAR LENS PLACEMENT (IOC);  Surgeon: Tonny Branch, MD;  Location: [facility];  Service: Ophthalmology;  Laterality: Left;  CDE: 18.97  . Colonoscopy N/A 02/09/2013    Procedure: COLONOSCOPY;  Surgeon: Rogene Houston, MD;  [facility];  Service: Endoscopy;  Laterality: N/A;  305-moved to 220 Ann to notify pt  . Insertion of dialysis catheter Right 06/24/2013    Procedure: INSERTION OF DIALYSIS CATHETER: Ultrasound guided;  Surgeon: Serafina Mitchell, MD;  Location: [place];  Service: Vascular;  Laterality: Right;  . 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 Tee without cardioversion N/A 07/21/2013    Procedure: TRANSESOPHAGEAL ECHOCARDIOGRAM (TEE);  Surgeon: Dorothy Spark, MD;  Location: [place];  Service: Cardiovascular;  Laterality: N/A;  . Av fistula  placement Right 09/08/2013    Procedure: CREATION OF RIGHT BRACHIAL CEPHALIC ARTERIOVENOUS FISTULA ;  Surgeon: Mal Misty, MD;  Location: [facility];  Service: Vascular;  Laterality: Right;    Subjective Symptoms/Limitations Symptoms: Rt LE weakness > than Lt, Numbness in Rt foot > Lt Foot. No pain, except with occasional tingling in feet. SDhe previously used no assistive device patient now has to use assistive device due to weakness.  Pertinent History: Stroke in January, with second mini stork in February. Dilaysis, Diabetes II  Limitations: Standing;Walking How long can you stand comfortably?: needs walker How long can you walk comfortably?: 38minutes with walker due to weakness.  Pain Assessment Currently in Pain?: No/denies  Precautions/Restrictions  Precautions Precaution Comments: dialysis - Monday Wednesday Friday  Balance Screening    Prior Functioning     Cognition/Observation    Sensation/Coordination/Flexibility/Functional Tests Sensation Light Touch: Appears Intact Stereognosis: Not tested Hot/Cold: Not tested Proprioception: Appears Intact Functional Tests Functional Tests: 5x sit to stand 27.5 seconds  Assessment RLE AROM (degrees) RLE Overall AROM Comments: WFL RLE Strength Right Hip Flexion: 4/5 Right Hip Extension: 3+/5 Right Hip ABduction: 3+/5 Right Knee Flexion: 4/5 Right Knee Extension: 4/5 Right Ankle Dorsiflexion: 4/5  Exercise/Treatments Mobility/Balance  Berg Balance Test Sit to Stand: Able to stand  independently using hands Standing Unsupported: Able to stand safely 2 minutes Sitting with Back Unsupported but Feet Supported on Floor or Stool: Able to sit safely and securely 2 minutes Stand  to Sit: Sits safely with minimal use of hands Transfers: Able to transfer safely, minor use of hands Standing Unsupported with Eyes Closed: Able to stand 10 seconds safely Standing Ubsupported with Feet Together: Able to place feet together  independently and stand 1 minute safely From Standing, Reach Forward with Outstretched Arm: Can reach confidently >25 cm (10") From Standing Position, Pick up Object from Floor: Able to pick up shoe safely and easily From Standing Position, Turn to Look Behind Over each Shoulder: Looks behind one side only/other side shows less weight shift Turn 360 Degrees: Able to turn 360 degrees safely but slowly Standing Unsupported, Alternately Place Feet on Step/Stool: Able to stand independently and complete 8 steps >20 seconds Standing Unsupported, One Foot in Front: Needs help to step but can hold 15 seconds Standing on One Leg: Tries to lift leg/unable to hold 3 seconds but remains standing independently Total Score: 45   Physical Therapy Assessment and Plan PT Assessment and Plan Clinical Impression Statement: Patient arrives to therapy 1 month status post stroke exhbiting Rt LE weakess and Bilateral foot numbness and tingling attributed to limited hip mobility and history of diabetes that patient notes she has had difficulty controllng as the numbness and tingling increases when her glucose is elevated. Patient demosntrated limited balance with difficulty specifically during single leg standing and dynamic activities. Patient will benefit from skilled physical therapy to improve safety during ambulation, improved transfers and improved balance so patient can return to performing all ADL's independently.  Pt will benefit from skilled therapeutic intervention in order to improve on the following deficits: Decreased activity tolerance;Decreased balance;Decreased coordination;Difficulty walking;Decreased strength;Decreased range of motion;Decreased mobility;Impaired flexibility;Pain Rehab Potential: Good PT Frequency: Min 2X/week PT Duration: 6 weeks PT Treatment/Interventions: Gait training;Stair training;Therapeutic exercise;Balance training;Therapeutic activities;Manual techniques;Patient/family  education PT Plan: Progress glut strength and balance. Specifically sit to stand transfers from lower surfaces, and single led stand activities.     Goals PT Short Term Goals Time to Complete Short Term Goals: 3 weeks PT Short Term Goal 1: sit to stand from chair height without using hands PT Short Term Goal 2: Able to ambulate 68minutes with a SPC PT Short Term Goal 3: Improve glut med strength to 4/5 to improve patient's single leg balance to >5 seconds PT Long Term Goals Time to Complete Long Term Goals:  (6 weeks) PT Long Term Goal 1: Perform 5x sit to stand in <15seconds to indicate improved LE power and improved ability to safely stand up from chair.  PT Long Term Goal 2: Able to walk 110minutes without assistive device Long Term Goal 3: Patient able to ambulate up a flight of stairs without handrail assitance Long Term Goal 4: Improve Berg balance score to 52 inmdicating improved balance and decreased risk of falls  Problem List Patient Active Problem List   Diagnosis Date Noted  . Right hemiparesis 08/19/2013  . CVA (cerebral infarction) 07/20/2013  . Gastroparesis due to DM 07/01/2013  . End stage renal disease 06/25/2013  . Anemia 01/26/2013  . DM W/NEURO MNFST, TYPE II, UNCONTROLLED 11/19/2006  . LEUKOCYTOSIS 11/19/2006  . DEPENDENT EDEMA, LEGS, BILATERAL 11/19/2006  . SINUS TACHYCARDIA 10/16/2006  . DIABETES MELLITUS, TYPE II 05/20/2006  . HYPERLIPIDEMIA 05/20/2006  . SYNDROME, RESTLESS LEGS 05/20/2006  . PERIPHERAL NEUROPATHY 05/20/2006  . HYPERTENSION 05/20/2006    PT - End of Session Equipment Utilized During Treatment: Gait belt Activity Tolerance: Patient tolerated treatment well General Behavior During Therapy: [facility] for tasks assessed/performed PT Plan of  Care PT Home Exercise Plan: 5x sit to stand 2 to 3 times daily.   GP Functional Assessment Tool Used: FOTO Status: 51%, limited 49%, BERG Functional Limitation: Mobility: Walking and moving  around Mobility: Walking and Moving Around Current Status [id number]): At least 40 percent but less than 60 percent impaired, limited or restricted Mobility: Walking and Moving Around Goal Status [phone]): At least 20 percent but less than 40 percent impaired, limited or restricted  Leia Alf 10/13/2013, 12:29 PM  Physician Documentation Your signature is required to indicate approval of the treatment plan as stated above.  Please sign and either send electronically or make a copy of this report for your files and return this physician signed original.   Please mark one 1.__approve of plan  2. ___approve of plan with the following conditions.   ______________________________                                                          _____________________ Physician Signature                                                                                                             Date

## 2013-10-18 ENCOUNTER — Ambulatory Visit (HOSPITAL_COMMUNITY)
Admission: RE | Admit: 2013-10-18 | Discharge: 2013-10-18 | Disposition: A | Discharge status: Home / Self Care | Payer: Medicare Other | Source: Ambulatory Visit | Attending: Family Medicine | Admitting: Family Medicine

## 2013-10-18 DIAGNOSIS — E119 Type 2 diabetes mellitus without complications: Secondary | ICD-10-CM | POA: Insufficient documentation

## 2013-10-18 DIAGNOSIS — R262 Difficulty in walking, not elsewhere classified: Secondary | ICD-10-CM | POA: Insufficient documentation

## 2013-10-18 DIAGNOSIS — I10 Essential (primary) hypertension: Secondary | ICD-10-CM | POA: Insufficient documentation

## 2013-10-18 DIAGNOSIS — I69959 Hemiplegia and hemiparesis following unspecified cerebrovascular disease affecting unspecified side: Secondary | ICD-10-CM | POA: Insufficient documentation

## 2013-10-18 DIAGNOSIS — IMO0001 Reserved for inherently not codable concepts without codable children: Secondary | ICD-10-CM | POA: Insufficient documentation

## 2013-10-18 DIAGNOSIS — R209 Unspecified disturbances of skin sensation: Secondary | ICD-10-CM | POA: Insufficient documentation

## 2013-10-18 NOTE — Progress Notes (Signed)
Physical Therapy Treatment Patient Details  Name: [name] MRN: [id number] Date of Birth: 04/24/1941  Today's Date: 10/18/2013 Time: 0915-1005 PT Time Calculation (min): 50 min Charge:TE [id number], NMR [id number]  Visit#: 2 of 12  Re-eval: 11/12/13 Assessment Diagnosis: Rt  LE weakness following CVA Next MD Visit: Dr. [name] once a month, unsure when.   Authorization: Medicare  Authorization Time Period:    Authorization Visit#: 2 of 12   Subjective: Symptoms/Limitations Symptoms: Pain free  most difficulty currently having is working on sit to stand.  Has been practicing at home. Pain Assessment Currently in Pain?: No/denies  Precautions/Restrictions  Precautions Precaution Comments: dialysis - Monday Wednesday Friday  Exercise/Treatments Standing Functional Squat: 10 reps;3 seconds Wall Squat: 5 reps;3 seconds Rocker Board: 2 minutes;Limitations Rocker Board Limitations: R/L and A/P Other Standing Knee Exercises: 3D hip excursion 10x  Other Standing Knee Exercises: Bil Hip abd and ext10x  Balance Exercises Standing Tandem Stance: Eyes open;2 reps;30 secs Tandem Gait: Forward;1 rep Retro Gait: 1 rep Sidestepping: 1 rep Marching: Solid surface;10 reps;Limitations Marching Limitations: no HHA 5" holds Heel Raises: 10 reps Toe Raise: 10 reps      Physical Therapy Assessment and Plan PT Assessment and Plan Clinical Impression Statement: Began POC for LE strengthening and balance training.  Pt with multimodal cueing required for correct form and technique with exercises.  Min assistance and mod cueing for technique with sit to stand, added airex for increased chair heigth to assist.  Began functional squats and wall squats for gluteal strengthening to assist with sit to stands.  Min assistance required with balance activities with constant cueing to look up to improve spatial awareness.  Pt limited by fatigue, no reports of pain through session. PT Plan:  Progress glut strength and balance. Specifically sit to stand transfers from lower surfaces, and single led stand activities.     Goals PT Short Term Goals Time to Complete Short Term Goals: 3 weeks PT Short Term Goal 1: sit to stand from chair height without using hands PT Short Term Goal 1 - Progress: Progressing toward goal PT Short Term Goal 2: Able to ambulate 18minutes with a SPC PT Short Term Goal 3: Improve glut med strength to 4/5 to improve patient's single leg balance to >5 seconds PT Short Term Goal 3 - Progress: Progressing toward goal PT Long Term Goals Time to Complete Long Term Goals:  (6 weeks) PT Long Term Goal 1: Perform 5x sit to stand in <15seconds to indicate improved LE power and improved ability to safely stand up from chair.  PT Long Term Goal 2: Able to walk 62minutes without assistive device Long Term Goal 3: atient able to ambulate up a flight of stairs without handrail assitance  Problem List Patient Active Problem List   Diagnosis Date Noted  . Right hemiparesis 08/19/2013  . CVA (cerebral infarction) 07/20/2013  . Gastroparesis due to DM 07/01/2013  . End stage renal disease 06/25/2013  . Anemia 01/26/2013  . DM W/NEURO MNFST, TYPE II, UNCONTROLLED 11/19/2006  . LEUKOCYTOSIS 11/19/2006  . DEPENDENT EDEMA, LEGS, BILATERAL 11/19/2006  . SINUS TACHYCARDIA 10/16/2006  . DIABETES MELLITUS, TYPE II 05/20/2006  . HYPERLIPIDEMIA 05/20/2006  . SYNDROME, RESTLESS LEGS 05/20/2006  . PERIPHERAL NEUROPATHY 05/20/2006  . HYPERTENSION 05/20/2006    PT - End of Session Equipment Utilized During Treatment: Gait belt Activity Tolerance: Patient tolerated treatment well General Behavior During Therapy: [facility] for tasks assessed/performed  GP    Aldona Lento 10/18/2013, 10:12  AM

## 2013-10-20 ENCOUNTER — Encounter: Payer: Self-pay | Admitting: Internal Medicine

## 2013-10-20 ENCOUNTER — Ambulatory Visit (HOSPITAL_COMMUNITY)
Admission: RE | Admit: 2013-10-20 | Discharge: 2013-10-20 | Disposition: A | Discharge status: Home / Self Care | Payer: Medicare Other | Source: Ambulatory Visit | Attending: Family Medicine | Admitting: Family Medicine

## 2013-10-20 NOTE — Progress Notes (Signed)
Physical Therapy Treatment Patient Details  Name: [name] MRN: [id number] Date of Birth: 10/17/1940  Today's Date: 10/20/2013 Time: 1015-1100 PT Time Calculation (min): 45 min  Visit#: 3 of 12  Re-eval: 11/12/13 Authorization: Medicare  Authorization Visit#: 3 of 12  Charges: therex 1015-1040 (25'), NMR [id number] (18')  Subjective: Symptoms/Limitations Symptoms: Pt states her legs are a little sore today.  Reports she is having the most difficulty with sit to stand. Pt comes today using a RW for ambulation. Pain Assessment Currently in Pain?: No/denies   Exercise/Treatments Standing Functional Squat: 10 reps;3 seconds Wall Squat: 5 reps;3 seconds Rocker Board: 2 minutes;Limitations Rocker Board Limitations: R/L and A/P SLS: R/L 3 seconds max each without UE assist Gait Training: with SPC/ without AD Other Standing Knee Exercises: 3D hip excursion 10x  Seated Other Seated Knee Exercises: sit to stand 5 times from standard chair without UE's  Balance Exercises Standing Tandem Gait: Forward;2 reps Retro Gait: 2 reps Sidestepping: 2 reps Marching: Solid surface;10 reps;Limitations Marching Limitations: no HHA 5" holds Heel Raises: 10 reps Toe Raise: 10 reps     Physical Therapy Assessment and Plan PT Assessment and Plan Clinical Impression Statement: ATtempted gait training with SPC, however patient too confused with sequencing.  Does well without AD, however when increased speed tends to drag toes and stumble.  Pt able to regain balance independently.  Began sit to stands.  Pt requires therapist facilitation to complete without leaning and pushing backward into extension.  No fatigue or complaints of pain during session. PT Plan: Progress glut strength and balance. Specifically sit to stand transfers from lower surfaces, and single led stand activities. Increase difficulty/speed of gait with stop/go/turning activities.     Problem List Patient Active Problem  List   Diagnosis Date Noted  . Right hemiparesis 08/19/2013  . CVA (cerebral infarction) 07/20/2013  . Gastroparesis due to DM 07/01/2013  . End stage renal disease 06/25/2013  . Anemia 01/26/2013  . DM W/NEURO MNFST, TYPE II, UNCONTROLLED 11/19/2006  . LEUKOCYTOSIS 11/19/2006  . DEPENDENT EDEMA, LEGS, BILATERAL 11/19/2006  . SINUS TACHYCARDIA 10/16/2006  . DIABETES MELLITUS, TYPE II 05/20/2006  . HYPERLIPIDEMIA 05/20/2006  . SYNDROME, RESTLESS LEGS 05/20/2006  . PERIPHERAL NEUROPATHY 05/20/2006  . HYPERTENSION 05/20/2006    PT - End of Session Equipment Utilized During Treatment: Gait belt Activity Tolerance: Patient tolerated treatment well General Behavior During Therapy: [facility] for tasks assessed/performed   Teena Irani, PTA/CLT 10/20/2013, 11:37 AM

## 2013-10-22 ENCOUNTER — Other Ambulatory Visit: Payer: Self-pay | Admitting: Physical Medicine & Rehabilitation

## 2013-10-25 ENCOUNTER — Ambulatory Visit (HOSPITAL_COMMUNITY)
Admission: RE | Admit: 2013-10-25 | Discharge: 2013-10-25 | Disposition: A | Discharge status: Home / Self Care | Payer: Medicare Other | Source: Ambulatory Visit | Attending: Family Medicine | Admitting: Family Medicine

## 2013-10-25 ENCOUNTER — Ambulatory Visit (INDEPENDENT_AMBULATORY_CARE_PROVIDER_SITE_OTHER): Payer: Medicare Other | Admitting: *Deleted

## 2013-10-25 DIAGNOSIS — I6350 Cerebral infarction due to unspecified occlusion or stenosis of unspecified cerebral artery: Secondary | ICD-10-CM

## 2013-10-25 DIAGNOSIS — I639 Cerebral infarction, unspecified: Secondary | ICD-10-CM

## 2013-10-25 LAB — MDC_IDC_ENUM_SESS_TYPE_REMOTE

## 2013-10-25 NOTE — Progress Notes (Signed)
Physical Therapy Treatment Patient Details  Name: [name] MRN: [id number] Date of Birth: 02-12-1941  Today's Date: 10/25/2013 Time: 1005-1048 PT Time Calculation (min): 43 min  Visit#: 4 of 12  Re-eval: 11/12/13 Authorization: Medicare  Authorization Visit#: 4 of 12  Charges:  Gait 1005-1015 (10'), therex 1016-1030 (14'), NMR 1032-1048 (16')  Subjective: Symptoms/Limitations Symptoms: Pt states no pain but having stiffness in her Rt quad region.  continues to use her RW for ambulation. Pain Assessment Currently in Pain?: No/denies   Exercise/Treatments Standing Functional Squat: 10 reps;3 seconds Wall Squat: 5 reps;3 seconds Rocker Board: 2 minutes;Limitations Rocker Board Limitations: R/L and A/P SLS: R/L 4" Left, 7" Right Stop/go/turn gait around department Seated Other Seated Knee Exercises: sit to stand 5 times from standard chair without UE's Balance Exercises Standing Balance Beam: tandem and sidestepping 2RT each Retro Gait: 2 reps Marching: Solid surface;10 reps;Limitations Marching Limitations: no HHA 5" holds Heel Raises: 10 reps Toe Raise: 10 reps;Limitations Toe Raise Limitations: seated   Physical Therapy Assessment and Plan PT Assessment and Plan Clinical Impression Statement: Attempted heel and toe walks and unable to complete due to weakness.  Also unable to complete standing toeraises due to weakness, however able to complete in seated position.  Had patient concentrate on heelraises performing slowly and controlled to help strengthen calf musculature.  Began stop/go/turn activities with overall good control.  Required min assist at times.  Progressed to tandem and side stepping on balance beam but unable to complete retro due to instability.  Pt only required 2 seated rest breaks this session. PT Plan: Progress glut strength and balance. Specifically sit to stand transfers from lower surfaces, and single leg stand activities.      Problem  List Patient Active Problem List   Diagnosis Date Noted  . Right hemiparesis 08/19/2013  . CVA (cerebral infarction) 07/20/2013  . Gastroparesis due to DM 07/01/2013  . End stage renal disease 06/25/2013  . Anemia 01/26/2013  . DM W/NEURO MNFST, TYPE II, UNCONTROLLED 11/19/2006  . LEUKOCYTOSIS 11/19/2006  . DEPENDENT EDEMA, LEGS, BILATERAL 11/19/2006  . SINUS TACHYCARDIA 10/16/2006  . DIABETES MELLITUS, TYPE II 05/20/2006  . HYPERLIPIDEMIA 05/20/2006  . SYNDROME, RESTLESS LEGS 05/20/2006  . PERIPHERAL NEUROPATHY 05/20/2006  . HYPERTENSION 05/20/2006    PT - End of Session Equipment Utilized During Treatment: Gait belt Activity Tolerance: Patient tolerated treatment well General Behavior During Therapy: [facility] for tasks assessed/performed   Teena Irani, PTA/CLT 10/25/2013, 10:57 AM

## 2013-10-27 ENCOUNTER — Ambulatory Visit (HOSPITAL_COMMUNITY)
Admission: RE | Admit: 2013-10-27 | Discharge: 2013-10-27 | Disposition: A | Discharge status: Home / Self Care | Payer: Medicare Other | Source: Ambulatory Visit | Attending: Family Medicine | Admitting: Family Medicine

## 2013-10-27 NOTE — Progress Notes (Signed)
Physical Therapy Treatment Patient Details  Name: [name] MRN: [id number] Date of Birth: 10/08/1940  Today's Date: 10/27/2013 Time: 1005-1051 PT Time Calculation (min): 46 min   Charges: 1005-1031 TherEx, [id number] TherAct Visit#: 5 of 12  Re-eval: 11/12/13 Assessment Diagnosis: Rt  LE weakness following CVA Next MD Visit: Dr. [name] once a month, unsure when.   Authorization: Medicare  Authorization Time Period:    Authorization Visit#: 5 of 12   Subjective: Symptoms/Limitations Symptoms: Pt states no pain but having stiffness in her Rt quad region.  continues to use her front wheeled walker for ambulation.  Precautions/Restrictions  Precautions Precaution Comments: dialysis - Monday Wednesday Friday  Exercise/Treatments Stretches Gastroc Stretch: 5 reps;10 seconds (3 way with wedge 4 sets) 3D ankle excursion in parallel bars to increase ankle dorsiflexion 10x Standing Functional Squat: 5 sets (7 sets at decreasing height 23 inches to 18inches) Rocker Board: 2 minutes;Limitations Rocker Board Limitations: R/L and A/P  Physical Therapy Assessment and Plan PT Assessment and Plan Clinical Impression Statement: Patient displays limited ability to perform sit to stand withotu UE support due to severe limitation in gastroc/soleus flexibility resulting in limited biomechanical advatage as apatient is ubnable to perform correct sequencing and positioning to use proper body mechics for sit to stand aspatient attempts to perform ssit to stand on toes withtou heel contact bilaterally. Follwoign multiple bouts of gastroc/soleus stretches patient was able to perform sit to stand from an 18"  with no UE support for 1 repetition.  Pt will benefit from skilled therapeutic intervention in order to improve on the following deficits: Decreased activity tolerance;Decreased balance;Decreased coordination;Difficulty walking;Decreased strength;Decreased range of motion;Decreased  mobility;Impaired flexibility;Pain PT Plan: Progress glut strength and balance. Specifically sit to stand transfers from lower surfaces, and single leg stand activities.     Goals PT Short Term Goals PT Short Term Goal 1: sit to stand from chair height without using hands PT Short Term Goal 1 - Progress: Progressing toward goal PT Short Term Goal 2: Able to ambulate 31minutes with a SPC PT Short Term Goal 2 - Progress: Progressing toward goal PT Short Term Goal 3: Improve glut med strength to 4/5 to improve patient's single leg balance to >5 seconds PT Short Term Goal 3 - Progress: Progressing toward goal PT Long Term Goals PT Long Term Goal 1: Perform 5x sit to stand in <15seconds to indicate improved LE power and improved ability to safely stand up from chair.  PT Long Term Goal 1 - Progress: Progressing toward goal PT Long Term Goal 2: Able to walk 93minutes without assistive device PT Long Term Goal 2 - Progress: Progressing toward goal Long Term Goal 3: atient able to ambulate up a flight of stairs without handrail assitance Long Term Goal 3 Progress: Progressing toward goal Long Term Goal 4: Improve Berg balance score to 52 inmdicating improved balance and decreased risk of falls Long Term Goal 4 Progress: Progressing toward goal  Problem List Patient Active Problem List   Diagnosis Date Noted  . Right hemiparesis 08/19/2013  . CVA (cerebral infarction) 07/20/2013  . Gastroparesis due to DM 07/01/2013  . End stage renal disease 06/25/2013  . Anemia 01/26/2013  . DM W/NEURO MNFST, TYPE II, UNCONTROLLED 11/19/2006  . LEUKOCYTOSIS 11/19/2006  . DEPENDENT EDEMA, LEGS, BILATERAL 11/19/2006  . SINUS TACHYCARDIA 10/16/2006  . DIABETES MELLITUS, TYPE II 05/20/2006  . HYPERLIPIDEMIA 05/20/2006  . SYNDROME, RESTLESS LEGS 05/20/2006  . PERIPHERAL NEUROPATHY 05/20/2006  . HYPERTENSION 05/20/2006  PT - End of Session Equipment Utilized During Treatment: Gait belt Activity  Tolerance: Patient tolerated treatment well PT Plan of Care PT Home Exercise Plan: 5x sit to stand 2 to 3 times daily.   GP    Suzette Battiest Carrianne Hyun PT DPT 10/27/2013, 12:19 PM

## 2013-11-01 ENCOUNTER — Ambulatory Visit (HOSPITAL_COMMUNITY)
Admission: RE | Admit: 2013-11-01 | Discharge: 2013-11-01 | Disposition: A | Discharge status: Home / Self Care | Payer: Medicare Other | Source: Ambulatory Visit | Attending: Family Medicine | Admitting: Family Medicine

## 2013-11-01 NOTE — Progress Notes (Signed)
Physical Therapy Treatment Patient Details  Name: MADELON PERT MRN: [id number] Date of Birth: [birth year]  Today's Date: 11/01/2013 Time: 1015-1100 PT Time Calculation (min): 45 min   Charges: [phone], Manual [id number], Gait Training 1052-1100 Visit#: 6 of 12  Re-eval: 11/12/13 Assessment Diagnosis: Rt  LE weakness following CVA Next MD Visit: Dr. [name] once a month, unsure when.   Authorization: Medicare  Authorization Time Period:    Authorization Visit#: 6 of 12   Subjective: Symptoms/Limitations Symptoms: Pt states no pain but having stiffness in her Rt quad region.  continues to use her front wheeled walker for ambulation due to feeling occasionally unsafe with cane and no Ad Pertinent History: Stroke in January, with second mini stork in February. Dilaysis, Diabetes II  Limitations: Standing;Walking How long can you stand comfortably?: needs walker How long can you walk comfortably?: 43minutes with walker due to weakness.  Pain Assessment Currently in Pain?: No/denies  Precautions/Restrictions  Precautions Precaution Comments: dialysis - Monday Wednesday Friday  Exercise/Treatments Stretches Gastroc Stretch: 5 reps;10 seconds (3 way with wedge 4 sets) Aerobic  Nustep 64min Lvl 2 Standing Functional Squat: 5 sets (7 sets at decreasing height 23 inches to 18inches) Rocker Board: 2 minutes;Limitations Rocker Board Limitations: R/L and A/P Other Standing Knee Exercises: 3D hip excursion 10x  Seated Other Seated Knee Exercises: sit to stand 5 times from standard chair without UE's    Physical Therapy Assessment and Plan PT Assessment and Plan Clinical Impression Statement: Patient continues to display limited ability to perform sit to stand without UE support due to severe limitation in gastroc/soleus flexibility resulting in limited biomechanical advantage as the patient is unable to perform correct sequencing and positioning to use proper body  mechanics for sit to stand as patient attempts to perform sit to stand on toes without heel contact bilaterally. Following multiple bouts of gastroc/soleus stretches patient was able to perform sit to stand from an 18" with no UE support for 1 repetition.  PT Treatment/Interventions: Financial trader;Therapeutic exercise;Balance training;Therapeutic activities;Manual techniques;Patient/family education PT Plan: Progress glut strength and balance. Specifically sit to stand transfers from lower surfaces, and single leg stand activities.     Goals    Problem List Patient Active Problem List   Diagnosis Date Noted  . Right hemiparesis 08/19/2013  . CVA (cerebral infarction) 07/20/2013  . Gastroparesis due to DM 07/01/2013  . End stage renal disease 06/25/2013  . Anemia 01/26/2013  . DM W/NEURO MNFST, TYPE II, UNCONTROLLED 11/19/2006  . LEUKOCYTOSIS 11/19/2006  . DEPENDENT EDEMA, LEGS, BILATERAL 11/19/2006  . SINUS TACHYCARDIA 10/16/2006  . DIABETES MELLITUS, TYPE II 05/20/2006  . HYPERLIPIDEMIA 05/20/2006  . SYNDROME, RESTLESS LEGS 05/20/2006  . PERIPHERAL NEUROPATHY 05/20/2006  . HYPERTENSION 05/20/2006    PT Plan of Care PT Home Exercise Plan: 5x sit to stand 2 to 3 times daily.   GP    Topaz Raglin R Tremayne Sheldon 11/01/2013, 7:50 PM

## 2013-11-03 ENCOUNTER — Encounter: Payer: Medicare Other | Attending: Physical Medicine & Rehabilitation

## 2013-11-03 ENCOUNTER — Ambulatory Visit (HOSPITAL_COMMUNITY)
Admission: RE | Admit: 2013-11-03 | Discharge: 2013-11-03 | Disposition: A | Discharge status: Home / Self Care | Payer: Medicare Other | Source: Ambulatory Visit | Attending: Family Medicine | Admitting: Family Medicine

## 2013-11-03 ENCOUNTER — Ambulatory Visit (HOSPITAL_BASED_OUTPATIENT_CLINIC_OR_DEPARTMENT_OTHER): Payer: Medicare Other | Admitting: Physical Medicine & Rehabilitation

## 2013-11-03 ENCOUNTER — Encounter: Payer: Self-pay | Admitting: Physical Medicine & Rehabilitation

## 2013-11-03 VITALS — BP 147/76 | HR 75 | Resp 14 | Wt 161.2 lb

## 2013-11-03 DIAGNOSIS — H539 Unspecified visual disturbance: Secondary | ICD-10-CM

## 2013-11-03 DIAGNOSIS — I69993 Ataxia following unspecified cerebrovascular disease: Secondary | ICD-10-CM

## 2013-11-03 DIAGNOSIS — I129 Hypertensive chronic kidney disease with stage 1 through stage 4 chronic kidney disease, or unspecified chronic kidney disease: Secondary | ICD-10-CM | POA: Insufficient documentation

## 2013-11-03 DIAGNOSIS — I69998 Other sequelae following unspecified cerebrovascular disease: Secondary | ICD-10-CM

## 2013-11-03 DIAGNOSIS — I69398 Other sequelae of cerebral infarction: Principal | ICD-10-CM

## 2013-11-03 DIAGNOSIS — N189 Chronic kidney disease, unspecified: Secondary | ICD-10-CM | POA: Insufficient documentation

## 2013-11-03 NOTE — Progress Notes (Signed)
Physical Therapy Treatment Patient Details  Name: [name] MRN: [id number] Date of Birth: 09/05/1940  Today's Date: 11/03/2013 Time: 1020-1105 PT Time Calculation (min): 45 min  Visit#: 7 of 12  Re-eval: 11/12/13 Authorization: Medicare  Authorization Visit#: 7 of 12  Charges:  therex 1020-1045 (25'), NMR 1046-1104 (18')  Subjective: Symptoms/Limitations Symptoms: Pt states she is doing well today.  Continues with stiffness in her Rt quad but no LOB or falls.   Exercise/Treatments Stretches Press photographer: 3 reps;30 seconds;Limitations Press photographer Limitations: 3 way Seated Other Seated Knee Exercises: sit to stand 5 times from standard chair without UE's Balance Exercises Standing Balance Beam: tandem, retro and sidestepping 2RT each Marching: Solid surface;Limitations;15 reps Marching Limitations: no HHA 5" holds Heel Raises: 15 reps     Physical Therapy Assessment and Plan PT Assessment and Plan Clinical Impression Statement: Patient requires moderate assistance to descend slowly with sit to stand without UE support. Continues to ascend with use of toes rather than flat stance. Progressed to retro tandem on balance beam.  Pt requires therapist facilitation and able to complete with less LOB with cues to decrease gait speed and reestablish balance with each step.   PT Plan: Progress glut strength and balance. Progress single leg activities.  Add hip abduction activity against wall next visit.      Problem List Patient Active Problem List   Diagnosis Date Noted  . Right hemiparesis 08/19/2013  . CVA (cerebral infarction) 07/20/2013  . Gastroparesis due to DM 07/01/2013  . End stage renal disease 06/25/2013  . Anemia 01/26/2013  . DM W/NEURO MNFST, TYPE II, UNCONTROLLED 11/19/2006  . LEUKOCYTOSIS 11/19/2006  . DEPENDENT EDEMA, LEGS, BILATERAL 11/19/2006  . SINUS TACHYCARDIA 10/16/2006  . DIABETES MELLITUS, TYPE II 05/20/2006  . HYPERLIPIDEMIA  05/20/2006  . SYNDROME, RESTLESS LEGS 05/20/2006  . PERIPHERAL NEUROPATHY 05/20/2006  . HYPERTENSION 05/20/2006    PT - End of Session Equipment Utilized During Treatment: Gait belt Activity Tolerance: Patient tolerated treatment well General Behavior During Therapy: [facility] for tasks assessed/performed  GP    Teena Irani, PTA/CLT 11/03/2013, 11:43 AM

## 2013-11-03 NOTE — Progress Notes (Deleted)
Physical Therapy Treatment Patient Details  Name: [name] MRN: [id number] Date of Birth: 04/16/1941  Today's Date: 11/03/2013 Time: 1020-1105 PT Time Calculation (min): 45 min  Visit#: 7 of 12  Re-eval: 11/12/13 Authorization: Medicare  Authorization Visit#: 7 of 12  Charges:  therex 1020-1045 (25'), NMR 1046-1104 (18')  Subjective: Symptoms/Limitations Symptoms: Pt states she is doing well today.  Continues with stiffness in her Rt quad but no LOB or falls.   Exercise/Treatments Stretches Press photographer: 3 reps;30 seconds;Limitations Press photographer Limitations: 3 way Seated Other Seated Knee Exercises: sit to stand 5 times from standard chair without UE's Balance Exercises Standing Balance Beam: tandem, retro and sidestepping 2RT each Marching: Solid surface;Limitations;15 reps Marching Limitations: no HHA 5" holds Heel Raises: 15 reps     Physical Therapy Assessment and Plan PT Assessment and Plan Clinical Impression Statement: Patient requires moderate assistance to descend slowly with sit to stand without UE support. Continues to ascend with use of toes rather than flat stance. Progressed to retro tandem on balance beam.  Pt requires therapist facilitation and able to complete with less LOB with cues to decrease gait speed and reestablish balance with each step.   PT Plan: Progress glut strength and balance. Progress single leg activities.  Add hip abduction activity against wall next visit.      Problem List Patient Active Problem List   Diagnosis Date Noted  . Right hemiparesis 08/19/2013  . CVA (cerebral infarction) 07/20/2013  . Gastroparesis due to DM 07/01/2013  . End stage renal disease 06/25/2013  . Anemia 01/26/2013  . DM W/NEURO MNFST, TYPE II, UNCONTROLLED 11/19/2006  . LEUKOCYTOSIS 11/19/2006  . DEPENDENT EDEMA, LEGS, BILATERAL 11/19/2006  . SINUS TACHYCARDIA 10/16/2006  . DIABETES MELLITUS, TYPE II 05/20/2006  . HYPERLIPIDEMIA  05/20/2006  . SYNDROME, RESTLESS LEGS 05/20/2006  . PERIPHERAL NEUROPATHY 05/20/2006  . HYPERTENSION 05/20/2006    PT - End of Session Equipment Utilized During Treatment: Gait belt Activity Tolerance: Patient tolerated treatment well General Behavior During Therapy: [facility] for tasks assessed/performed  GP    Teena Irani, PTA/CLT 11/03/2013, 11:27 AM

## 2013-11-03 NOTE — Progress Notes (Signed)
Subjective:    Patient ID: [name], female    DOB: 08/02/1940, 73 y.o.   MRN: [id number] DATE OF ADMISSION: 08/23/2013  DATE OF DISCHARGE: 08/30/2013  bicerebral infarcts in setting of ESRD  Finished with HH PT and OT    HPI Currently undergoing outpatient PT with further treatment scheduled until June 11 No falls at home Using walker. Did not use assistive device prior to stroke in March  Patient has seen her eye doctor who referred the patient to a retina specialist. The retina specialist did not feel like there is any retinal problem causing vision issues Pain Inventory Average Pain 0 Pain Right Now 0 My pain is no pain  In the last 24 hours, has pain interfered with the following? General activity 0 Relation with others 0 Enjoyment of life 0 What TIME of day is your pain at its worst? no pain Sleep (in general) Good  Pain is worse with: no pain Pain improves with: no pain Relief from Meds: 0  Mobility use a walker how many minutes can you walk? 15-20 ability to climb steps?  yes do you drive?  no  Function not employed: date last employed .  Neuro/Psych tingling  Prior Studies Any changes since last visit?  no  Physicians involved in your care Any changes since last visit?  no   Family History  Problem Relation Age of Onset  . Anesthesia problems Neg Hx   . Hypotension Neg Hx   . Malignant hyperthermia Neg Hx   . Pseudochol deficiency Neg Hx   . Cancer Sister   . Cancer Brother    History   Social History  . Marital Status: Widowed    Spouse Name: N/A    Number of Children: N/A  . Years of Education: N/A   Social History Main Topics  . Smoking status: Never Smoker   . Smokeless tobacco: Never Used  . Alcohol Use: No  . Drug Use: No  . Sexual Activity: None   Other Topics Concern  . None   Social History Narrative  . None   Past Surgical History  Procedure Laterality Date  . Cataract extraction w/phaco  11/20/2011   Procedure: CATARACT EXTRACTION PHACO AND INTRAOCULAR LENS PLACEMENT (IOC);  Surgeon: Tonny Branch, MD;  Location: [facility];  Service: Ophthalmology;  Laterality: Right;  CDE 18.82  . Cataract extraction w/phaco Left 11/18/2012    Procedure: CATARACT EXTRACTION PHACO AND INTRAOCULAR LENS PLACEMENT (IOC);  Surgeon: Tonny Branch, MD;  Location: [facility];  Service: Ophthalmology;  Laterality: Left;  CDE: 18.97  . Colonoscopy N/A 02/09/2013    Procedure: COLONOSCOPY;  Surgeon: Rogene Houston, MD;  Location: [facility];  Service: Endoscopy;  Laterality: N/A;  305-moved to 220 Ann to notify pt  . Insertion of dialysis catheter Right 06/24/2013    Procedure: INSERTION OF DIALYSIS CATHETER: Ultrasound guided;  Surgeon: Serafina Mitchell, MD;  Location: [place];  Service: Vascular;  Laterality: Right;  . 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 Tee without cardioversion N/A 07/21/2013    Procedure: TRANSESOPHAGEAL ECHOCARDIOGRAM (TEE);  Surgeon: Dorothy Spark, MD;  Location: [place];  Service: Cardiovascular;  Laterality: N/A;  . Av fistula placement Right 09/08/2013    Procedure: CREATION OF RIGHT BRACHIAL CEPHALIC ARTERIOVENOUS FISTULA ;  Surgeon: Mal Misty, MD;  Location: [place];  Service: Vascular;  Laterality: Right;   Past Medical History  Diagnosis Date  . Hypertension   . Cataract   . Diabetes mellitus   . Chronic kidney disease   . Shortness of breath   . Anemia   . CVA (cerebral infarction)    BP 147/76  Pulse 75  Resp 14  Wt 161 lb 3.2 oz (73.12 kg)  SpO2 100%  Opioid Risk Score:   Fall Risk Score: High Fall Risk (>13 points) (educated and handout for fall prevention in the home given)  Review of Systems  Neurological:       Tingling  All other systems reviewed and are negative.      Objective:   Physical Exam Motor strength is 5/5 in bilateral deltoid, bicep, tricep, grip, hip flexor, knee extensors, ankle dorsiflexor  Gait  is using a walker. No evidence of toe drag or knee instability  Can walk with help, hand-held  Mood and affect are appropriate  Speech without dysarthria or aphasia  Patient has left neglect on confrontation testing      Assessment & Plan:  #1. Bicerebral infarcts making good progress with home health physical therapy however not back to baseline. Did not use assistive device prior to CVA Have a goal of returning to walk with cane or no assistive device  Once patient reaches that goalwe will discuss getting back to driving. The patient was driving prior to her stroke in March of 2015 I am concerned the patient may have left neglect which may in P. her driving abilities. I have made a referral to neural ophthalmologist for further evaluation

## 2013-11-04 ENCOUNTER — Encounter: Payer: Self-pay | Admitting: Vascular Surgery

## 2013-11-07 LAB — MDC_IDC_ENUM_SESS_TYPE_REMOTE

## 2013-11-08 ENCOUNTER — Ambulatory Visit (INDEPENDENT_AMBULATORY_CARE_PROVIDER_SITE_OTHER): Payer: Self-pay | Admitting: Vascular Surgery

## 2013-11-08 ENCOUNTER — Ambulatory Visit (HOSPITAL_COMMUNITY)
Admission: RE | Admit: 2013-11-08 | Discharge: 2013-11-08 | Disposition: A | Discharge status: Home / Self Care | Payer: Medicare Other | Source: Ambulatory Visit | Attending: Family Medicine | Admitting: Family Medicine

## 2013-11-08 ENCOUNTER — Encounter: Payer: Self-pay | Admitting: Vascular Surgery

## 2013-11-08 ENCOUNTER — Ambulatory Visit (HOSPITAL_COMMUNITY)
Admission: RE | Admit: 2013-11-08 | Discharge: 2013-11-08 | Disposition: A | Discharge status: Home / Self Care | Payer: Medicare Other | Source: Ambulatory Visit | Attending: Vascular Surgery | Admitting: Vascular Surgery

## 2013-11-08 VITALS — BP 190/83 | HR 80 | Ht 67.0 in | Wt 156.6 lb

## 2013-11-08 DIAGNOSIS — Z4931 Encounter for adequacy testing for hemodialysis: Secondary | ICD-10-CM | POA: Insufficient documentation

## 2013-11-08 DIAGNOSIS — N186 End stage renal disease: Secondary | ICD-10-CM

## 2013-11-08 NOTE — Progress Notes (Signed)
Subjective:     Patient ID: [name], female   DOB: October 09, 1940, 73 y.o.   MRN: [id number]  HPI this 73 year old female with end-stage renal disease currently on hemodialysis through a right IJ tunneled catheter, and a right radial cephalic AV fistula created by me on 09/08/2013. She denies any pain or numbness in the right hand.  Review of Systems     Objective:   Physical Exam BP 190/83  Pulse 80  Ht 5\' 7"  (1.702 m)  Wt 156 lb 9.6 oz (71.033 kg)  BMI 24.52 kg/m2  SpO2 100%  General well-developed well-nourished female no apparent stress alert and oriented x3 Right upper extremity with well-healed incision. Good pulse and palpable thrill at the antecubital area. Right and well perfused.  Today I ordered a venous duplex exam of the right upper extremity fistula. Fistula is widely patent with no significant competing branches and excellent flow throughout.      Assessment:     Nicely functioning right radial by cephalic AV fistula created 2 months ago    Plan:     Okay to use fistula after 12/09/2013 Return to see me on when necessary basis

## 2013-11-08 NOTE — Progress Notes (Signed)
Physical Therapy Treatment Patient Details  Name: [name] MRN: [id number] Date of Birth: 01/11/1941  Today's Date: 11/08/2013 Time: [id number] PT Time Calculation (min): 63 min Charge: TE 1018-1038, NMR [id number]  Visit#: 8 of 12  Re-eval: 11/12/13 Assessment Diagnosis: Rt  LE weakness following CVA Next MD Visit: Dr. [name] once a month, unsure when.   Authorization: Medicare  Authorization Time Period:    Authorization Visit#: 8 of 12   Subjective: Symptoms/Limitations Symptoms: Pain free today.  Continues with stiffness in Rt knee and c/o most difficulty with sit to standing Pain Assessment Currently in Pain?: No/denies  Precautions/Restrictions  Precautions Precaution Comments: dialysis - Monday Wednesday Friday  Exercise/Treatments Balance Exercises Standing Balance Beam: tandem, retro and sidestepping 2RT each Marching: Solid surface;Limitations;15 reps Marching Limitations: no HHA 5" holds Heel Raises: 15 reps Toe Raise: 15 reps Other Standing Exercises: 3D hip excursion 15x; hip abduction against wall with therapist facilitation for proper form and technique Other Standing Exercises: wall slides 10x 5"   Seated Other Seated Exercises: airex no HHA 10 STS min assist   Physical Therapy Assessment and Plan PT Assessment and Plan Clinical Impression Statement: Pt continues to display increased difficulty with sit to stands without UE support due to weakness and severe limiitation in gastroc/soleus flexibilty resulting in limited biomechamics advantage as pt is unable to perform correct sequencing and positioning with proper body mechnaincs.  Continued stretches for flexibilty and added glut strengthening exercises with therapist facilitation for proper musculature activation to reduce compensation.  Pt limited by fatigue at end of session, no reports of pain. PT Plan: Progress glut strength and balance. Progress single leg activities.    Goals PT  Short Term Goals PT Short Term Goal 1: sit to stand from chair height without using hands PT Short Term Goal 1 - Progress: Progressing toward goal PT Short Term Goal 2: Able to ambulate 58minutes with a SPC PT Short Term Goal 2 - Progress: Progressing toward goal PT Short Term Goal 3: Improve glut med strength to 4/5 to improve patient's single leg balance to >5 seconds PT Short Term Goal 3 - Progress: Progressing toward goal PT Long Term Goals PT Long Term Goal 1: Perform 5x sit to stand in <15seconds to indicate improved LE power and improved ability to safely stand up from chair.  PT Long Term Goal 2: Able to walk 16minutes without assistive device Long Term Goal 3: atient able to ambulate up a flight of stairs without handrail assitance Long Term Goal 4: Improve Berg balance score to 52 inmdicating improved balance and decreased risk of falls  Problem List Patient Active Problem List   Diagnosis Date Noted  . Disturbances of vision, late effect of stroke 11/03/2013  . Right hemiparesis 08/19/2013  . CVA (cerebral infarction) 07/20/2013  . Gastroparesis due to DM 07/01/2013  . End stage renal disease 06/25/2013  . Anemia 01/26/2013  . DM W/NEURO MNFST, TYPE II, UNCONTROLLED 11/19/2006  . LEUKOCYTOSIS 11/19/2006  . DEPENDENT EDEMA, LEGS, BILATERAL 11/19/2006  . SINUS TACHYCARDIA 10/16/2006  . DIABETES MELLITUS, TYPE II 05/20/2006  . HYPERLIPIDEMIA 05/20/2006  . SYNDROME, RESTLESS LEGS 05/20/2006  . PERIPHERAL NEUROPATHY 05/20/2006  . HYPERTENSION 05/20/2006    PT - End of Session Equipment Utilized During Treatment: Gait belt Activity Tolerance: Patient tolerated treatment well;Patient limited by fatigue General Behavior During Therapy: [facility] for tasks assessed/performed  GP    Aldona Lento 11/08/2013, 12:09 PM

## 2013-11-10 ENCOUNTER — Ambulatory Visit (HOSPITAL_COMMUNITY)
Admission: RE | Admit: 2013-11-10 | Discharge: 2013-11-10 | Disposition: A | Discharge status: Home / Self Care | Payer: Medicare Other | Source: Ambulatory Visit | Attending: Family Medicine | Admitting: Family Medicine

## 2013-11-10 NOTE — Evaluation (Signed)
Physical Therapy Reassessment  Patient Details  Name: [name] MRN: [id number] Date of Birth: 11/07/1940  Today's Date: 11/10/2013 Time: 1018-1100 PT Time Calculation (min): 42 min    Charges: TherEx 1018-1050, Gait Training 1050-1100          Visit#: 9 of 12  Re-eval: 12/10/13 Assessment Diagnosis: Rt  LE weakness following CVA Next MD Visit: Dr. Gerald Hill once a month, unsure when.   Authorization: Medicare    Authorization Time Period:    Authorization Visit#: 9 of 12   Past Medical History:  Past Medical History  Diagnosis Date  . Hypertension   . Cataract   . Diabetes mellitus   . Chronic kidney disease   . Shortness of breath   . Anemia   . CVA (cerebral infarction)    Past Surgical History:  Past Surgical History  Procedure Laterality Date  . Cataract extraction w/phaco  11/20/2011    Procedure: CATARACT EXTRACTION PHACO AND INTRAOCULAR LENS PLACEMENT (IOC);  Surgeon: Kerry Hunt, MD;  Location: [facility];  Service: Ophthalmology;  Laterality: Right;  CDE 18.82  . Cataract extraction w/phaco Left 11/18/2012    Procedure: CATARACT EXTRACTION PHACO AND INTRAOCULAR LENS PLACEMENT (IOC);  Surgeon: Kerry Hunt, MD;  Location: [facility];  Service: Ophthalmology;  Laterality: Left;  CDE: 18.97  . Colonoscopy N/A 02/09/2013    Procedure: COLONOSCOPY;  Surgeon: Najeeb U Rehman, MD;  Location: [facility];  Service: Endoscopy;  Laterality: N/A;  305-moved to 220 Ann to notify pt  . Insertion of dialysis catheter Right 06/24/2013    Procedure: INSERTION OF DIALYSIS CATHETER: Ultrasound guided;  Surgeon: Vance W Brabham, MD;  Location: [facility] OR;  Service: Vascular;  Laterality: Right;  . Loop recorder implant  07-21-13    MDT LinQ implanted by Dr Taylor for cryptogenic stroke  . Tee without cardioversion N/A 07/21/2013    Procedure: TRANSESOPHAGEAL ECHOCARDIOGRAM (TEE);  Surgeon: Katarina H Nelson, MD;  Location: [facility];  Service: Cardiovascular;  Laterality: N/A;  . 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  Continues with stiffness in Rt knee and c/o most difficulty with sit to standing  Precautions/Restrictions  Precautions Precaution Comments: dialysis - Monday Wednesday Friday  Assessment RLE Strength Right Hip Flexion: 4/5 Right Hip Extension: 3+/5 Right Hip ABduction: 3+/5 Right Knee Flexion: 4/5 Right Knee Extension: 4/5 Right Ankle Dorsiflexion: 4/5  Exercise/Treatments Mobility/Balance  Berg Balance Test Sit to Stand: Able to stand  independently using hands Standing Unsupported: Able to stand safely 2 minutes Sitting with Back Unsupported but Feet Supported on Floor or Stool: Able to sit safely and securely 2 minutes Stand to Sit: Sits safely with minimal use of hands Transfers: Able to transfer safely, minor use of hands Standing Unsupported with Eyes Closed: Able to stand 10 seconds safely Standing Ubsupported with Feet Together: Able to place feet together independently and stand 1 minute safely From Standing, Reach Forward with Outstretched Arm: Can reach confidently >25 cm (10") From Standing Position, Pick up Object from Floor: Able to pick up shoe safely and easily From Standing Position, Turn to Look Behind Over each Shoulder: Looks behind from both sides and weight shifts well Turn 360 Degrees: Able to turn 360 degrees safely one side only in 4 seconds or less Standing Unsupported, Alternately   Place Feet on Step/Stool: Able to stand independently and safely and complete 8 steps in 20 seconds Standing Unsupported, One Foot in Front: Able to plae foot ahead of the other independently and hold 30 seconds Standing on One Leg: Tries to lift leg/unable to hold 3 seconds but remains standing independently Total Score: 50    Stretches Gastroc Stretch: 3 reps;30 seconds;Limitations Gastroc Stretch Limitations: 3 way Standing Forward Step Up: Step Height: 8";10 reps;3 sets Functional Squat: 5 sets (7 sets at decreasing height 23 inches to 18inches) Other Standing Knee Exercises: 3D hip excursion 10x  Other Standing Knee Exercises: Obstackle course: 4x 6" hurdles, 4x 12" hurdles, balance beam, 8" step up 4x  Seated Other Seated Knee Exercises: sit to stand 5 times from standard chair without UE's  Physical Therapy Assessment and Plan PT Assessment and Plan Clinical Impression Statement: patien continues to have dificulty performing si to stand withotu her UE for assitance. Patient is no longer dependent on using any asssitive device for gait and issafe to ambualte withotu any asssitive device. Patient will continue to benefit from skilled phsycial therapy to meet her goals of performing sitt to stand transfers withtou UE assistance and decrease her risk of falls by improving her LE strength and balance.  Pt will benefit from skilled therapeutic intervention in order to improve on the following deficits: Decreased activity tolerance;Decreased balance;Decreased coordination;Difficulty walking;Decreased strength;Decreased range of motion;Decreased mobility;Impaired flexibility;Pain Rehab Potential: Good PT Treatment/Interventions: Gait training;Stair training;Therapeutic exercise;Balance training;Therapeutic activities;Manual techniques;Patient/family education PT Plan: Continue phsycial therapy 3 more visits for patient to meet long term goals. Progress glut strength and balance. Progress single leg activities.    Goals PT Short Term Goals Time to Complete Short Term Goals: 3 weeks PT Short Term Goal 1: sit to stand from chair height without using hands PT Short Term Goal 1 - Progress: Progressing toward goal PT Short Term Goal 2: Able to ambulate 66mnutes with a SPC PT Short Term Goal 2 - Progress: Met PT  Short Term Goal 3: Improve glut med strength to 4/5 to improve patient's single leg balance to >5 seconds PT Short Term Goal 3 - Progress: Progressing toward goal PT Long Term Goals PT Long Term Goal 1: Perform 5x sit to stand in <15seconds to indicate improved LE power and improved ability to safely stand up from chair.  PT Long Term Goal 1 - Progress: Progressing toward goal PT Long Term Goal 2: Able to walk 321mutes without assistive device PT Long Term Goal 2 - Progress: Met Long Term Goal 3: Patient able to ambulate up a flight of stairs without handrail assitance Long Term Goal 3 Progress: Progressing toward goal Long Term Goal 4: Improve Berg balance score to 52 inmdicating improved balance and decreased risk of falls Long Term Goal 4 Progress: Progressing toward goal  Problem List Patient Active Problem List   Diagnosis Date Noted  . Disturbances of vision, late effect of stroke 11/03/2013  . Right hemiparesis 08/19/2013  . CVA (cerebral infarction) 07/20/2013  . Gastroparesis due to DM 07/01/2013  . End stage renal disease 06/25/2013  . Anemia 01/26/2013  . DM W/NEURO MNFST, TYPE II, UNCONTROLLED 11/19/2006  . LEUKOCYTOSIS 11/19/2006  . DEPENDENT EDEMA, LEGS, BILATERAL 11/19/2006  . SINUS TACHYCARDIA 10/16/2006  . DIABETES MELLITUS, TYPE II 05/20/2006  . HYPERLIPIDEMIA 05/20/2006  . SYNDROME, RESTLESS LEGS 05/20/2006  . PERIPHERAL NEUROPATHY 05/20/2006  . HYPERTENSION 05/20/2006    PT - End of Session Equipment Utilized During Treatment: Gait belt Activity  Tolerance: Patient tolerated treatment well;Patient limited by fatigue General Behavior During Therapy: WFL for tasks assessed/performed  GP Functional Assessment Tool Used: Clinical judgement Functional Limitation: Mobility: Walking and moving around Mobility: Walking and Moving Around Current Status (D9741): At least 20 percent but less than 40 percent impaired, limited or restricted Mobility: Walking and  Moving Around Goal Status [phone]): At least 1 percent but less than 20 percent impaired, limited or restricted  Leia Alf 11/10/2013, 12:25 PM  Physician Documentation Your signature is required to indicate approval of the treatment plan as stated above.  Please sign and either send electronically or make a copy of this report for your files and return this physician signed original.   Please mark one 1.__approve of plan  2. ___approve of plan with the following conditions.   ______________________________                                                          _____________________ Physician Signature                                                                                                             Date

## 2013-11-15 ENCOUNTER — Ambulatory Visit (HOSPITAL_COMMUNITY)
Admission: RE | Admit: 2013-11-15 | Discharge: 2013-11-15 | Disposition: A | Discharge status: Home / Self Care | Payer: Medicare Other | Source: Ambulatory Visit | Attending: Family Medicine | Admitting: Family Medicine

## 2013-11-15 DIAGNOSIS — E119 Type 2 diabetes mellitus without complications: Secondary | ICD-10-CM | POA: Insufficient documentation

## 2013-11-15 DIAGNOSIS — I10 Essential (primary) hypertension: Secondary | ICD-10-CM | POA: Insufficient documentation

## 2013-11-15 DIAGNOSIS — I69959 Hemiplegia and hemiparesis following unspecified cerebrovascular disease affecting unspecified side: Secondary | ICD-10-CM | POA: Insufficient documentation

## 2013-11-15 DIAGNOSIS — R209 Unspecified disturbances of skin sensation: Secondary | ICD-10-CM | POA: Insufficient documentation

## 2013-11-15 DIAGNOSIS — R262 Difficulty in walking, not elsewhere classified: Secondary | ICD-10-CM | POA: Insufficient documentation

## 2013-11-15 DIAGNOSIS — IMO0001 Reserved for inherently not codable concepts without codable children: Secondary | ICD-10-CM | POA: Insufficient documentation

## 2013-11-15 NOTE — Progress Notes (Addendum)
Physical Therapy Treatment Patient Details  Name: [name] MRN: [id number] Date of Birth: 29-Apr-1941  Today's Date: 11/15/2013 Time: 1015-1103 PT Time Calculation (min): 48 min Charge: Gait 1015-1025, TE 1025-1103  Visit#: 10 of 12  Re-eval: 12/10/13 Assessment Diagnosis: Rt  LE weakness following CVA Next MD Visit: Dr. [name] once a month, unsure when.   Authorization: Medicare  Authorization Time Period: Gcode complete on 9th visit  Authorization Visit#: 10 of 12   Subjective: Symptoms/Limitations Symptoms: Pain free today, pt continues to ambulate with RW.  Most difficulty with sit to standing Pain Assessment Currently in Pain?: No/denies  Precautions/Restrictions  Precautions Precaution Comments: dialysis - Monday Wednesday Friday  Exercise/Treatments Stretches Gastroc Stretch: 3 reps;30 seconds;Limitations Gastroc Stretch Limitations: 3 way Standing Functional Squat: 10 reps;Limitations Functional Squat Limitations: 3D hip excursion Wall Squat: 5 seconds;10 reps Rocker Board: 2 minutes;Limitations Rocker Board Limitations: R/L and A/P Gait Training: with SPC/ without AD; cueing for sequencing with opposite UE/LE Other Standing Knee Exercises: 3D hip excursion 10x  ankle excursion for dorsi/plantar flexion 10x (cueing to keep heel on floor) Seated Other Seated Knee Exercises: sit to stand 10 times from standard chair with airex without UE's; min A Other Seated Knee Exercises: Dorsiflexion 2sets with increased knee flexion and cueing to keep heel on floor     Physical Therapy Assessment and Plan PT Assessment and Plan Clinical Impression Statement: Session focus on improviing hip and ankle mobiltiy, flexibilty and gait training with no AD.  Pt continues to have increased difficutly with sit to stand believed to be  due to long femur bone, decreased dorsiflexion and weak mm Bil LE.  Gastroc stretches complete in 3 directions for muscle lengthening and  seated dorsiflexion exercises complete wtih max knee flexion and heels on floor to improve ankle mobility.  Min A still required with STS.  Gait training with SPC utilized in each arm to improve arm swing with opposite foot on floor, cueing for posture, increase stride length and sequencing (decreased coordination with Lt UE).  End of session pt able to demonstrate appropriate gait mechanics wtih no cueing required without AD.  Pt encouaged to go without AD inside home to improve confidence and gait mechanics with no AD. PT Plan: Continue 2 more sessions to meet long term goals.  Progress gluteal strengthen, balance and immprove gait mechancis.  Progress single leg activtieis.    Goals PT Short Term Goals Time to Complete Short Term Goals: 3 weeks PT Short Term Goal 1: sit to stand from chair height without using hands PT Short Term Goal 1 - Progress: Progressing toward goal PT Short Term Goal 2: Able to ambulate 75minutes with a SPC PT Short Term Goal 3: Improve glut med strength to 4/5 to improve patient's single leg balance to >5 seconds PT Long Term Goals PT Long Term Goal 1: Perform 5x sit to stand in <15seconds to indicate improved LE power and improved ability to safely stand up from chair.  PT Long Term Goal 1 - Progress: Progressing toward goal PT Long Term Goal 2: Able to walk 6minutes without assistive device Long Term Goal 3: Patient able to ambulate up a flight of stairs without handrail assitance Long Term Goal 3 Progress: Progressing toward goal Long Term Goal 4: Improve Berg balance score to 52 inmdicating improved balance and decreased risk of falls  Problem List Patient Active Problem List   Diagnosis Date Noted  . Disturbances of vision, late effect of stroke 11/03/2013  .  Right hemiparesis 08/19/2013  . CVA (cerebral infarction) 07/20/2013  . Gastroparesis due to DM 07/01/2013  . End stage renal disease 06/25/2013  . Anemia 01/26/2013  . DM W/NEURO MNFST, TYPE II,  UNCONTROLLED 11/19/2006  . LEUKOCYTOSIS 11/19/2006  . DEPENDENT EDEMA, LEGS, BILATERAL 11/19/2006  . SINUS TACHYCARDIA 10/16/2006  . DIABETES MELLITUS, TYPE II 05/20/2006  . HYPERLIPIDEMIA 05/20/2006  . SYNDROME, RESTLESS LEGS 05/20/2006  . PERIPHERAL NEUROPATHY 05/20/2006  . HYPERTENSION 05/20/2006    PT - End of Session Equipment Utilized During Treatment: Gait belt Activity Tolerance: Patient tolerated treatment well;Patient limited by fatigue General Behavior During Therapy: [facility] for tasks assessed/performed  GP    Aldona Lento 11/15/2013, 11:28 AM

## 2013-11-17 ENCOUNTER — Ambulatory Visit (HOSPITAL_COMMUNITY)
Admission: RE | Admit: 2013-11-17 | Discharge: 2013-11-17 | Disposition: A | Discharge status: Home / Self Care | Payer: Medicare Other | Source: Ambulatory Visit | Attending: Family Medicine | Admitting: Family Medicine

## 2013-11-17 DIAGNOSIS — IMO0001 Reserved for inherently not codable concepts without codable children: Secondary | ICD-10-CM | POA: Diagnosis not present

## 2013-11-17 NOTE — Progress Notes (Signed)
Physical Therapy Treatment Patient Details  Name: [name] MRN: [id number] Date of Birth: 06/25/1940  Today's Date: 11/17/2013 Time: 1005-1050 PT Time Calculation (min): 45 min  Visit#: 11 of 12   Authorization: Medicare  Authorization Time Period: Gcode complete on 9th visit  Authorization Visit#: 11 of 12   Subjective: Symptoms/Limitations Symptoms: patient states continued difficulty with ssit to stand when not using UEs. Patient is happy to be walking withotu the walker and feels safe.   Exercise/Treatments Stretches Active Hamstring Stretch: 3 reps;30 seconds Gastroc Stretch: 3 reps;30 seconds;Limitations Gastroc Stretch Limitations: 3 way Standing Forward Lunges: Limitations Forward Lunges Limitations: 8" and 14 " box 10x each with Rt LE Forward Step Up: Step Height: 8";10 reps Functional Squat: 10 reps;Limitations Functional Squat Limitations: 3way with hand rail assist Stairs: 3 flights up and down reciprocol  Gait Training: lunge walks, high knee march, high heel walk,, long steps, fast walking 77f each Other Standing Knee Exercises: Single leg balance on airex alternating 10x 3seconds each Seated Other Seated Knee Exercises: sit to stand 10 times from standard chair with airex without UE's; min A    Physical Therapy Assessment and Plan PT Assessment and Plan Clinical Impression Statement: Session focused on improving hip and knee strength and stability, with continued focus on gait training without assistive device and stairs without UE support. Pt continues to have increased difficulty with sit to stand due to decreased dorsiflexion and weak mm Rt LE. Patient ambulated with stand by assist up 3 flights of stairs without hand rail assist without falls with only limitation due to decreased exercise tolerance.  PT Plan: Continue 2 more sessions to meet long term goals.  Progress gluteal strengthen, balance and immprove gait mechancis.  Progress single leg  activtieis.    Goals PT Short Term Goals PT Short Term Goal 1: sit to stand from chair height without using hands PT Short Term Goal 1 - Progress: Progressing toward goal PT Short Term Goal 2: Able to ambulate 164mutes with a SPC PT Short Term Goal 2 - Progress: Met PT Short Term Goal 3: Improve glut med strength to 4/5 to improve patient's single leg balance to >5 seconds PT Short Term Goal 3 - Progress: Progressing toward goal PT Long Term Goals PT Long Term Goal 1: Perform 5x sit to stand in <15seconds to indicate improved LE power and improved ability to safely stand up from chair.  PT Long Term Goal 1 - Progress: Progressing toward goal PT Long Term Goal 2: Able to walk 3041mtes without assistive device PT Long Term Goal 2 - Progress: Met Long Term Goal 3: Patient able to ambulate up a flight of stairs without handrail assitance Long Term Goal 3 Progress: Met Long Term Goal 4: Improve Berg balance score to 52 inmdicating improved balance and decreased risk of falls Long Term Goal 4 Progress: Progressing toward goal  Problem List Patient Active Problem List   Diagnosis Date Noted  . Disturbances of vision, late effect of stroke 11/03/2013  . Right hemiparesis 08/19/2013  . CVA (cerebral infarction) 07/20/2013  . Gastroparesis due to DM 07/01/2013  . End stage renal disease 06/25/2013  . Anemia 01/26/2013  . DM W/NEURO MNFST, TYPE II, UNCONTROLLED 11/19/2006  . LEUKOCYTOSIS 11/19/2006  . DEPENDENT EDEMA, LEGS, BILATERAL 11/19/2006  . SINUS TACHYCARDIA 10/16/2006  . DIABETES MELLITUS, TYPE II 05/20/2006  . HYPERLIPIDEMIA 05/20/2006  . SYNDROME, RESTLESS LEGS 05/20/2006  . PERIPHERAL NEUROPATHY 05/20/2006  . HYPERTENSION 05/20/2006    PT -  End of Session Equipment Utilized During Treatment: Gait belt Activity Tolerance: Patient tolerated treatment well;Patient limited by fatigue General Behavior During Therapy: [facility] for tasks assessed/performed  GP    Shannah Conteh R  Tarrin Menn 11/17/2013, 10:54 AM

## 2013-11-22 ENCOUNTER — Ambulatory Visit (HOSPITAL_COMMUNITY)
Admission: RE | Admit: 2013-11-22 | Discharge: 2013-11-22 | Disposition: A | Discharge status: Home / Self Care | Payer: Medicare Other | Source: Ambulatory Visit | Attending: Family Medicine | Admitting: Family Medicine

## 2013-11-22 DIAGNOSIS — IMO0001 Reserved for inherently not codable concepts without codable children: Secondary | ICD-10-CM | POA: Diagnosis not present

## 2013-11-22 NOTE — Progress Notes (Signed)
Physical Therapy Re-evaluation / discharge  Patient Details  Name: [name] MRN: [id number] Date of Birth: 04/19/1941  Today's Date: 11/22/2013 Time: 1015-1045 PT Time Calculation (min): 30 min Charges:  PPT 1015-1030 (15'), MMT 1030-1035, self care 1035-1045 (10')          Visit#: 12 of 12  Assessment Diagnosis: Rt  LE weakness following CVA Next MD Visit: Dr. Gerald Hill once a month, unsure when.  Authorization: Medicare    Authorization Visit#: 12 of 12    Subjective Symptoms/Limitations Symptoms: Pt states she feels much stronger.  PT states she has no pain or had any falls.  Pt is ambulating without AD Pain Assessment Currently in Pain?: No/denies  Precautions/Restrictions  Precautions Precaution Comments: dialysis - Monday Wednesday Friday  Objective Functional Tests Functional Tests: 5 STS using UE's 19"  RLE Strength Right Hip Flexion: 4/5 (was 4/5) Right Hip Extension: 4/5 (was 3+/5) Right Hip ABduction: 4/5 (was 3+/5) Right Knee Flexion: 4/5 (was 4/5) Right Knee Extension: 4/5 (was 4/5) Right Ankle Dorsiflexion: 4/5 (was 4/5)  Exercise/Treatments Mobility/Balance  Berg Balance Test Sit to Stand: Able to stand  independently using hands Standing Unsupported: Able to stand safely 2 minutes Sitting with Back Unsupported but Feet Supported on Floor or Stool: Able to sit safely and securely 2 minutes Stand to Sit: Sits safely with minimal use of hands Transfers: Able to transfer safely, minor use of hands Standing Unsupported with Eyes Closed: Able to stand 10 seconds safely Standing Ubsupported with Feet Together: Able to place feet together independently and stand 1 minute safely From Standing, Reach Forward with Outstretched Arm: Can reach confidently >25 cm (10") From Standing Position, Pick up Object from Floor: Able to pick up shoe safely and easily From Standing Position, Turn to Look Behind Over each Shoulder: Looks behind from both sides and  weight shifts well Turn 360 Degrees: Able to turn 360 degrees safely one side only in 4 seconds or less Standing Unsupported, Alternately Place Feet on Step/Stool: Able to stand independently and safely and complete 8 steps in 20 seconds Standing Unsupported, One Foot in Front: Able to plae foot ahead of the other independently and hold 30 seconds Standing on One Leg: Able to lift leg independently and hold 5-10 seconds Total Score: 52 (was 50/56)      Physical Therapy Assessment and Plan PT Assessment and Plan Clinical Impression Statement: Pt has completed 12 PT visits focusing on increasing LE strength, balance and overall functional independence.  Pt is now ambulating without AD, able to complete stairs reciprocally with intermittent HHA and able to rise from chair with decreased difficulty.  Pt has met 2/3 STG's and 2/4 LTG's.  BERG balance score has increased 2 points to 52/56.  PT feels she is ready to continue on her own and is very pleased with her progress.  PT Plan: Discharge to HEP.    Goals PT Short Term Goals PT Short Term Goal 1: sit to stand from chair height without using hands PT Short Term Goal 1 - Progress: Partly met (less assistance from UE's, however continues to rely on them) PT Short Term Goal 2: Able to ambulate 15minutes with a SPC PT Short Term Goal 2 - Progress: Met PT Short Term Goal 3: Improve glut med strength to 4/5 to improve patient's single leg balance to >5 seconds PT Short Term Goal 3 - Progress: Met PT Long Term Goals PT Long Term Goal 1: Perform 5x sit to stand in <15seconds   to indicate improved LE power and improved ability to safely stand up from chair.  PT Long Term Goal 1 - Progress: Partly met (Pt completed in 19 seconds with UE assistance) PT Long Term Goal 2: Able to walk 30minutes without assistive device PT Long Term Goal 2 - Progress: Met Long Term Goal 3: Patient able to ambulate up a flight of stairs without handrail assitance Long Term  Goal 3 Progress: Partly met (able to negotiate stairs reciprocally, continues to require intermittent use of UE's) Long Term Goal 4: Improve Berg balance score to 52 inmdicating improved balance and decreased risk of falls Long Term Goal 4 Progress: Met  Problem List Patient Active Problem List   Diagnosis Date Noted  . Disturbances of vision, late effect of stroke 11/03/2013  . Right hemiparesis 08/19/2013  . CVA (cerebral infarction) 07/20/2013  . Gastroparesis due to DM 07/01/2013  . End stage renal disease 06/25/2013  . Anemia 01/26/2013  . DM W/NEURO MNFST, TYPE II, UNCONTROLLED 11/19/2006  . LEUKOCYTOSIS 11/19/2006  . DEPENDENT EDEMA, LEGS, BILATERAL 11/19/2006  . SINUS TACHYCARDIA 10/16/2006  . DIABETES MELLITUS, TYPE II 05/20/2006  . HYPERLIPIDEMIA 05/20/2006  . SYNDROME, RESTLESS LEGS 05/20/2006  . PERIPHERAL NEUROPATHY 05/20/2006  . HYPERTENSION 05/20/2006    PT - End of Session Equipment Utilized During Treatment: Gait belt Activity Tolerance: Patient tolerated treatment well General Behavior During Therapy: WFL for tasks assessed/performed  GP Functional Assessment Tool Used: Clinical judgement Functional Limitation: Mobility: Walking and moving around Mobility: Walking and Moving Around Goal Status (G8979): At least 1 percent but less than 20 percent impaired, limited or restricted Mobility: Walking and Moving Around Discharge Status (G8980): At least 1 percent but less than 20 percent impaired, limited or restricted  Amy B Frazier, PTA/CLT 11/22/2013, 10:48 AM   

## 2013-11-22 NOTE — Progress Notes (Signed)
Physical Therapy Re-evaluation / discharge  Patient Details  Name: [name] MRN: [id number] Date of Birth: 12/08/1940  Today's Date: 11/22/2013 Time: 1015-1045 PT Time Calculation (min): 30 min Charges:  PPT 1015-1030 (15'), MMT 1030-1035, self care 1035-1045 (10')          Visit#: 12 of 12  Assessment Diagnosis: Rt  LE weakness following CVA Next MD Visit: Dr. Gerald Hill once a month, unsure when.  Authorization: Medicare    Authorization Visit#: 12 of 12    Subjective Symptoms/Limitations Symptoms: Pt states she feels much stronger.  PT states she has no pain or had any falls.  Pt is ambulating without AD Pain Assessment Currently in Pain?: No/denies  Precautions/Restrictions  Precautions Precaution Comments: dialysis - Monday Wednesday Friday  Objective Functional Tests Functional Tests: 5 STS using UE's 19"  RLE Strength Right Hip Flexion: 4/5 (was 4/5) Right Hip Extension: 4/5 (was 3+/5) Right Hip ABduction: 4/5 (was 3+/5) Right Knee Flexion: 4/5 (was 4/5) Right Knee Extension: 4/5 (was 4/5) Right Ankle Dorsiflexion: 4/5 (was 4/5)  Exercise/Treatments Mobility/Balance  Berg Balance Test Sit to Stand: Able to stand  independently using hands Standing Unsupported: Able to stand safely 2 minutes Sitting with Back Unsupported but Feet Supported on Floor or Stool: Able to sit safely and securely 2 minutes Stand to Sit: Sits safely with minimal use of hands Transfers: Able to transfer safely, minor use of hands Standing Unsupported with Eyes Closed: Able to stand 10 seconds safely Standing Ubsupported with Feet Together: Able to place feet together independently and stand 1 minute safely From Standing, Reach Forward with Outstretched Arm: Can reach confidently >25 cm (10") From Standing Position, Pick up Object from Floor: Able to pick up shoe safely and easily From Standing Position, Turn to Look Behind Over each Shoulder: Looks behind from both sides and  weight shifts well Turn 360 Degrees: Able to turn 360 degrees safely one side only in 4 seconds or less Standing Unsupported, Alternately Place Feet on Step/Stool: Able to stand independently and safely and complete 8 steps in 20 seconds Standing Unsupported, One Foot in Front: Able to plae foot ahead of the other independently and hold 30 seconds Standing on One Leg: Able to lift leg independently and hold 5-10 seconds Total Score: 52 (was 50/56)      Physical Therapy Assessment and Plan PT Assessment and Plan Clinical Impression Statement: Pt has completed 12 PT visits focusing on increasing LE strength, balance and overall functional independence.  Pt is now ambulating without AD, able to complete stairs reciprocally with intermittent HHA and able to rise from chair with decreased difficulty.  Pt has met 2/3 STG's and 2/4 LTG's.  BERG balance score has increased 2 points to 52/56.  PT feels she is ready to continue on her own and is very pleased with her progress.  PT Plan: Discharge to HEP.    Goals PT Short Term Goals PT Short Term Goal 1: sit to stand from chair height without using hands PT Short Term Goal 1 - Progress: Partly met (less assistance from UE's, however continues to rely on them) PT Short Term Goal 2: Able to ambulate 15minutes with a SPC PT Short Term Goal 2 - Progress: Met PT Short Term Goal 3: Improve glut med strength to 4/5 to improve patient's single leg balance to >5 seconds PT Short Term Goal 3 - Progress: Met PT Long Term Goals PT Long Term Goal 1: Perform 5x sit to stand in <15seconds   to indicate improved LE power and improved ability to safely stand up from chair.  PT Long Term Goal 1 - Progress: Partly met (Pt completed in 19 seconds with UE assistance) PT Long Term Goal 2: Able to walk 79mnutes without assistive device PT Long Term Goal 2 - Progress: Met Long Term Goal 3: Patient able to ambulate up a flight of stairs without handrail assitance Long Term  Goal 3 Progress: Partly met (able to negotiate stairs reciprocally, continues to require intermittent use of UE's) Long Term Goal 4: Improve Berg balance score to 52 inmdicating improved balance and decreased risk of falls Long Term Goal 4 Progress: Met  Problem List Patient Active Problem List   Diagnosis Date Noted  . Disturbances of vision, late effect of stroke 11/03/2013  . Right hemiparesis 08/19/2013  . CVA (cerebral infarction) 07/20/2013  . Gastroparesis due to DM 07/01/2013  . End stage renal disease 06/25/2013  . Anemia 01/26/2013  . DM W/NEURO MNFST, TYPE II, UNCONTROLLED 11/19/2006  . LEUKOCYTOSIS 11/19/2006  . DEPENDENT EDEMA, LEGS, BILATERAL 11/19/2006  . SINUS TACHYCARDIA 10/16/2006  . DIABETES MELLITUS, TYPE II 05/20/2006  . HYPERLIPIDEMIA 05/20/2006  . SYNDROME, RESTLESS LEGS 05/20/2006  . PERIPHERAL NEUROPATHY 05/20/2006  . HYPERTENSION 05/20/2006    PT - End of Session Equipment Utilized During Treatment: Gait belt Activity Tolerance: Patient tolerated treatment well General Behavior During Therapy: WFL for tasks assessed/performed  GP Functional Assessment Tool Used: Clinical judgement Functional Limitation: Mobility: Walking and moving around Mobility: Walking and Moving Around Goal Status [phone]: At least 1 percent but less than 20 percent impaired, limited or restricted Mobility: Walking and Moving Around Discharge Status [phone]: At least 1 percent but less than 20 percent impaired, limited or restricted  ATeena Irani PTA/CLT 11/22/2013, 10:48 AM  CDevona KonigPT DT

## 2013-11-23 ENCOUNTER — Encounter: Payer: Self-pay | Admitting: Internal Medicine

## 2013-11-23 ENCOUNTER — Ambulatory Visit (INDEPENDENT_AMBULATORY_CARE_PROVIDER_SITE_OTHER): Payer: Medicare Other | Admitting: *Deleted

## 2013-11-23 DIAGNOSIS — I639 Cerebral infarction, unspecified: Secondary | ICD-10-CM

## 2013-11-23 DIAGNOSIS — I6350 Cerebral infarction due to unspecified occlusion or stenosis of unspecified cerebral artery: Secondary | ICD-10-CM

## 2013-11-24 ENCOUNTER — Ambulatory Visit (HOSPITAL_COMMUNITY): Payer: Medicare Other | Admitting: Physical Therapy

## 2013-11-30 NOTE — Progress Notes (Signed)
Loop recorder 

## 2013-12-08 ENCOUNTER — Ambulatory Visit: Payer: Medicare Other | Admitting: Physical Medicine & Rehabilitation

## 2013-12-14 ENCOUNTER — Ambulatory Visit (INDEPENDENT_AMBULATORY_CARE_PROVIDER_SITE_OTHER): Payer: Medicare Other | Admitting: *Deleted

## 2013-12-14 DIAGNOSIS — I6350 Cerebral infarction due to unspecified occlusion or stenosis of unspecified cerebral artery: Secondary | ICD-10-CM

## 2013-12-14 DIAGNOSIS — I639 Cerebral infarction, unspecified: Secondary | ICD-10-CM

## 2013-12-20 ENCOUNTER — Emergency Department (HOSPITAL_COMMUNITY): Payer: Medicare Other

## 2013-12-20 ENCOUNTER — Emergency Department (HOSPITAL_COMMUNITY)
Admission: EM | Admit: 2013-12-20 | Discharge: 2013-12-20 | Disposition: A | Discharge status: Home / Self Care | Payer: Medicare Other | Attending: Emergency Medicine | Admitting: Emergency Medicine

## 2013-12-20 ENCOUNTER — Encounter (HOSPITAL_COMMUNITY): Payer: Self-pay | Admitting: Emergency Medicine

## 2013-12-20 DIAGNOSIS — Z992 Dependence on renal dialysis: Secondary | ICD-10-CM | POA: Insufficient documentation

## 2013-12-20 DIAGNOSIS — Z794 Long term (current) use of insulin: Secondary | ICD-10-CM | POA: Insufficient documentation

## 2013-12-20 DIAGNOSIS — I120 Hypertensive chronic kidney disease with stage 5 chronic kidney disease or end stage renal disease: Secondary | ICD-10-CM | POA: Insufficient documentation

## 2013-12-20 DIAGNOSIS — Z7902 Long term (current) use of antithrombotics/antiplatelets: Secondary | ICD-10-CM | POA: Insufficient documentation

## 2013-12-20 DIAGNOSIS — Z862 Personal history of diseases of the blood and blood-forming organs and certain disorders involving the immune mechanism: Secondary | ICD-10-CM | POA: Insufficient documentation

## 2013-12-20 DIAGNOSIS — E119 Type 2 diabetes mellitus without complications: Secondary | ICD-10-CM | POA: Insufficient documentation

## 2013-12-20 DIAGNOSIS — Z8673 Personal history of transient ischemic attack (TIA), and cerebral infarction without residual deficits: Secondary | ICD-10-CM | POA: Insufficient documentation

## 2013-12-20 DIAGNOSIS — Z8669 Personal history of other diseases of the nervous system and sense organs: Secondary | ICD-10-CM | POA: Insufficient documentation

## 2013-12-20 DIAGNOSIS — N186 End stage renal disease: Secondary | ICD-10-CM | POA: Insufficient documentation

## 2013-12-20 DIAGNOSIS — R109 Unspecified abdominal pain: Secondary | ICD-10-CM

## 2013-12-20 DIAGNOSIS — Z79899 Other long term (current) drug therapy: Secondary | ICD-10-CM | POA: Insufficient documentation

## 2013-12-20 DIAGNOSIS — Z9889 Other specified postprocedural states: Secondary | ICD-10-CM | POA: Insufficient documentation

## 2013-12-20 HISTORY — DX: Cerebral infarction, unspecified: I63.9

## 2013-12-20 HISTORY — DX: Dependence on renal dialysis: Z99.2

## 2013-12-20 LAB — COMPREHENSIVE METABOLIC PANEL
ALK PHOS: 77 U/L (ref 39–117)
ALT: 19 U/L (ref 0–35)
AST: 21 U/L (ref 0–37)
Albumin: 3.2 g/dL — ABNORMAL LOW (ref 3.5–5.2)
Anion gap: 11 (ref 5–15)
BUN: 33 mg/dL — ABNORMAL HIGH (ref 6–23)
CALCIUM: 8.6 mg/dL (ref 8.4–10.5)
CO2: 25 meq/L (ref 19–32)
Chloride: 101 mEq/L (ref 96–112)
Creatinine, Ser: 5.23 mg/dL — ABNORMAL HIGH (ref 0.50–1.10)
GFR, EST AFRICAN AMERICAN: 8 mL/min — AB (ref >90)
GFR, EST NON AFRICAN AMERICAN: 7 mL/min — AB (ref >90)
GLUCOSE: 151 mg/dL — AB (ref 70–99)
POTASSIUM: 4.5 meq/L (ref 3.7–5.3)
Sodium: 137 mEq/L (ref 137–147)
Total Bilirubin: 0.4 mg/dL (ref 0.3–1.2)
Total Protein: 7.4 g/dL (ref 6.0–8.3)

## 2013-12-20 LAB — CBC WITH DIFFERENTIAL/PLATELET
BASOS PCT: 0 % (ref 0–1)
Basophils Absolute: 0 10*3/uL (ref 0.0–0.1)
EOS ABS: 0.2 10*3/uL (ref 0.0–0.7)
Eosinophils Relative: 2 % (ref 0–5)
HCT: 34.3 % — ABNORMAL LOW (ref 36.0–46.0)
HEMOGLOBIN: 11.2 g/dL — AB (ref 12.0–15.0)
Lymphocytes Relative: 22 % (ref 12–46)
Lymphs Abs: 1.8 10*3/uL (ref 0.7–4.0)
MCH: 29.1 pg (ref 26.0–34.0)
MCHC: 32.7 g/dL (ref 30.0–36.0)
MCV: 89.1 fL (ref 78.0–100.0)
Monocytes Absolute: 0.7 10*3/uL (ref 0.1–1.0)
Monocytes Relative: 9 % (ref 3–12)
NEUTROS ABS: 5.4 10*3/uL (ref 1.7–7.7)
Neutrophils Relative %: 67 % (ref 43–77)
Platelets: 229 10*3/uL (ref 150–400)
RBC: 3.85 MIL/uL — ABNORMAL LOW (ref 3.87–5.11)
RDW: 17.5 % — ABNORMAL HIGH (ref 11.5–15.5)
WBC: 8.1 10*3/uL (ref 4.0–10.5)

## 2013-12-20 MED ORDER — ONDANSETRON HCL 4 MG/2ML IJ SOLN
4.0000 mg | Freq: Once | INTRAMUSCULAR | Status: AC
  Administered 2013-12-20: 4 mg via INTRAVENOUS
  Filled 2013-12-20: qty 2

## 2013-12-20 MED ORDER — OXYCODONE-ACETAMINOPHEN 5-325 MG PO TABS: 1.0000 | ORAL_TABLET | ORAL | Status: DC | PRN

## 2013-12-20 MED ORDER — MORPHINE SULFATE 4 MG/ML IJ SOLN
4.0000 mg | Freq: Once | INTRAMUSCULAR | Status: AC
  Administered 2013-12-20: 4 mg via INTRAVENOUS
  Filled 2013-12-20: qty 1

## 2013-12-20 NOTE — ED Notes (Signed)
MD at bedside. 

## 2013-12-20 NOTE — ED Provider Notes (Signed)
CSN: [id number]     Arrival date & time 12/20/13  1033 History  This chart was scribed for Nat Christen, MD,  by Stacy Gardner, ED Scribe. The patient was seen in room [facility] and the patient's care was started at 3:56 PM.   First MD Initiated Contact with Patient 12/20/13 1146     Chief Complaint  Patient presents with  . Flank Pain     (Consider location/radiation/quality/duration/timing/severity/associated sxs/prior Treatment) Patient is a 73 y.o. female presenting with flank pain. The history is provided by the patient and medical records. No language interpreter was used.  Flank Pain   HPI Comments: TIESE CARLEN is a 73 y.o. female who presents to the Emergency Department complaining of severe, sharp, waxing waning , right flank pain, onset one week ago. The pain is worse with movement. She was unable to sleep six days ago due to severe. Pt is on dialysis since January. Pt dialyzed the full treatment yesterday. Pt has a past medical hx of DM and HTN. Pt mentions that she has never had any pain like the pain she is experiencing today.  Past Medical History  Diagnosis Date  . Hypertension   . Cataract   . Diabetes mellitus   . Chronic kidney disease   . Shortness of breath   . Anemia   . CVA (cerebral infarction)   . Stroke   . Dialysis patient    Past Surgical History  Procedure Laterality Date  . Cataract extraction w/phaco  11/20/2011    Procedure: CATARACT EXTRACTION PHACO AND INTRAOCULAR LENS PLACEMENT (IOC);  Surgeon: Tonny Branch, MD;  Location: [facility];  Service: Ophthalmology;  Laterality: Right;  CDE 18.82  . Cataract extraction w/phaco Left 11/18/2012    Procedure: CATARACT EXTRACTION PHACO AND INTRAOCULAR LENS PLACEMENT (IOC);  Surgeon: Tonny Branch, MD;  Location: [facility];  Service: Ophthalmology;  Laterality: Left;  CDE: 18.97  . Colonoscopy N/A 02/09/2013    Procedure: COLONOSCOPY;  Surgeon: Rogene Houston, MD;  Location: [facility];  Service: Endoscopy;   Laterality: N/A;  305-moved to 220 Ann to notify pt  . Insertion of dialysis catheter Right 06/24/2013    Procedure: INSERTION OF DIALYSIS CATHETER: Ultrasound guided;  Surgeon: Serafina Mitchell, MD;  Location: [facility];  Service: Vascular;  Laterality: Right;  . 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 Tee without cardioversion N/A 07/21/2013    Procedure: TRANSESOPHAGEAL ECHOCARDIOGRAM (TEE);  Surgeon: Dorothy Spark, MD;  Location: [facility];  Service: Cardiovascular;  Laterality: N/A;  . Av fistula placement Right 09/08/2013    Procedure: CREATION OF RIGHT BRACHIAL CEPHALIC ARTERIOVENOUS FISTULA ;  Surgeon: Mal Misty, MD;  Location: [facility];  Service: Vascular;  Laterality: Right;   Family History  Problem Relation Age of Onset  . Anesthesia problems Neg Hx   . Hypotension Neg Hx   . Malignant hyperthermia Neg Hx   . Pseudochol deficiency Neg Hx   . Cancer Sister   . Cancer Brother    History  Substance Use Topics  . Smoking status: Never Smoker   . Smokeless tobacco: Never Used  . Alcohol Use: No   OB History   Grav Para Term Preterm Abortions TAB SAB Ect Mult Living                 Review of Systems  Genitourinary: Positive for flank pain.  All other systems reviewed and are  negative.     Allergies  Ambien and Reglan  Home Medications   Prior to Admission medications   Medication Sig Start Date End Date Taking? Authorizing Provider  calcium carbonate (TUMS - DOSED IN MG ELEMENTAL CALCIUM) 500 MG chewable tablet Chew 1 tablet by mouth 3 (three) times daily with meals.   Yes Historical Provider, MD  clopidogrel (PLAVIX) 75 MG tablet Take 75 mg by mouth daily with breakfast. 08/30/13  Yes Lavon Paganini Angiulli, PA-C  darbepoetin (ARANESP) 60 MCG/0.3ML SOLN injection Inject 0.3 mLs (60 mcg total) into the vein every Tuesday with hemodialysis. 08/23/13  Yes Geradine Girt, DO  doxercalciferol (HECTOROL) 4 MCG/2ML injection  Inject 1 mcg into the vein every Monday, Wednesday, and Friday with hemodialysis.  08/23/13  Yes Jessica U Vann, DO  insulin NPH Human (HUMULIN N,NOVOLIN N) 100 UNIT/ML injection Inject 5 Units into the skin 2 (two) times daily at 8 am and 10 pm. 08/30/13  Yes Daniel J Angiulli, PA-C  labetalol (NORMODYNE) 200 MG tablet Take 200 mg by mouth 2 (two) times daily. 08/30/13  Yes Daniel J Angiulli, PA-C  multivitamin (RENA-VIT) TABS tablet Take 1 tablet by mouth at bedtime. 07/04/13  Yes Cherene Altes, MD  pantoprazole (PROTONIX) 40 MG tablet Take 40 mg by mouth daily.   Yes Historical Provider, MD  oxyCODONE-acetaminophen (PERCOCET) 5-325 MG per tablet Take 1 tablet by mouth every 4 (four) hours as needed. 12/20/13   Nat Christen, MD   BP 188/93  Pulse 76  Temp(Src) 97.9 F (36.6 C) (Oral)  Resp 18  Ht 5\' 7"  (1.702 m)  Wt 155 lb (70.308 kg)  BMI 24.27 kg/m2  SpO2 100% Physical Exam  Nursing note and vitals reviewed. Constitutional: She is oriented to person, place, and time. She appears well-developed and well-nourished.  HENT:  Head: Normocephalic and atraumatic.  Eyes: Conjunctivae and EOM are normal. Pupils are equal, round, and reactive to light.  Neck: Normal range of motion. Neck supple.  Cardiovascular: Normal rate, regular rhythm and normal heart sounds.   Pulmonary/Chest: Effort normal and breath sounds normal.  Abdominal: Soft. Bowel sounds are normal.  Musculoskeletal: Normal range of motion. She exhibits tenderness.  Minimal tenderness in the right flank  Neurological: She is alert and oriented to person, place, and time.  Skin: Skin is warm and dry.  Psychiatric: She has a normal mood and affect. Her behavior is normal.    ED Course  Procedures (including critical care time) DIAGNOSTIC STUDIES: Oxygen Saturation is 99% on room air, normal by my interpretation.    COORDINATION OF CARE:  3:56 PM Discussed course of care with pt which includes CT scan and laboratory tests. Pt  understands and agrees.   Labs Review Labs Reviewed  CBC WITH DIFFERENTIAL - Abnormal; Notable for the following:    RBC 3.85 (*)    Hemoglobin 11.2 (*)    HCT 34.3 (*)    RDW 17.5 (*)    All other components within normal limits  COMPREHENSIVE METABOLIC PANEL - Abnormal; Notable for the following:    Glucose, Bld 151 (*)    BUN 33 (*)    Creatinine, Ser 5.23 (*)    Albumin 3.2 (*)    GFR calc non Af Amer 7 (*)    GFR calc Af Amer 8 (*)    All other components within normal limits    Imaging Review Ct Abdomen Pelvis Wo Contrast  12/20/2013   CLINICAL DATA:  Right flank pain with  onset 1 week ago. Diabetic hypertensive patient with chronic renal disease. Dialysis patient.  EXAM: CT ABDOMEN AND PELVIS WITHOUT CONTRAST  TECHNIQUE: Multidetector CT imaging of the abdomen and pelvis was performed following the standard protocol without IV contrast.  COMPARISON:  03/18/2009 CT.  FINDINGS: Split type dialysis catheter tips in the region of the right atrium. Coronary artery calcifications. Heart size top-normal. Dense breast parenchyma. Minimal atelectatic changes/scarring lung bases.  No evidence of hydronephrosis. Nonobstructing tiny left lower pole renal calculi. No right renal calculi identified. Multiple calcifications within the pelvic inlet suggestive of phleboliths rather than obstructing ureteral calculi.  3 gallstones measuring up to 1 cm. No CT evidence of cholecystitis however, if this is of concern, ultrasound would prove more sensitive for detection of gallbladder wall thickening.  Liver top-normal in length. Tiny calcification along the periphery of the liver. Taking into account limitation by non contrast imaging, no worrisome hepatic, splenic, renal or pancreatic abnormality. Mild hyperplasia of the adrenal glands.  Scattered colonic diverticula. Minimal haziness of fat plane surrounding diverticula of the proximal descending colon. Subtle acute inflammation at this level not excluded  although patient is tender on the opposite side. Gas-filled non inflamed appearing appendix.  Decompressed stomach limiting evaluation. No gross abnormality detected.  No free intraperitoneal air.  Degenerative changes lower thoracic and lumbar spine with multifactorial spinal stenosis most prominent L4-5 level.  Bilateral hip joint degenerative changes.  Uterus tilted towards the left with small calcifications.  No abdominal aortic aneurysm identified.  No adenopathy detected.  No bowel containing hernia.  IMPRESSION: No evidence of hydronephrosis or obstructing right renal calculi detected as cause of patient's symptoms. Tiny nonobstructing left lower pole renal calculi.  Scattered colonic diverticula with question of minimal inflammation of diverticula of the proximal descending colon.  No CT evidence of inflammation of the appendix.  Three gallstones measure up to 1 cm. No CT evidence of gallbladder inflammation however if this is of concern, ultrasound may be considered for further delineation.  Degenerative changes thoracic and lumbar spine with multifactorial spinal stenosis L4-5 level.  Please see above.   Electronically Signed   By: Chauncey Cruel M.D.   On: 12/20/2013 15:13   Dg Abd 1 View  12/20/2013   CLINICAL DATA:  Right flank pain  EXAM: ABDOMEN - 1 VIEW  COMPARISON:  None.  FINDINGS: The bowel gas pattern is normal. Multiple pelvic phleboliths. No radio-opaque calculi or other significant radiographic abnormality are seen.  IMPRESSION: Negative.   Electronically Signed   By: Kathreen Devoid   On: 12/20/2013 12:47     EKG Interpretation None      MDM   Final diagnoses:  Right flank pain   patient was unable to provide a urinary sample. She is nontoxic appearing. CT abdomen and pelvis show no acute findings. No acute abdomen. Discharge medication Percocet  I personally performed the services described in this documentation, which was scribed in my presence. The recorded information has been  reviewed and is accurate.     Nat Christen, MD 12/20/13 [phone]

## 2013-12-20 NOTE — Discharge Instructions (Signed)
X-ray scan of your abdomen and back showed no serious problems. Medication for pain. Followup your primary care Dr.   Lazaro Arms not miss dialysis

## 2013-12-20 NOTE — ED Notes (Signed)
Rt flank pain, onset 1 week ago,   Increased pain with movement.

## 2013-12-29 NOTE — Progress Notes (Signed)
Loop recorder 

## 2013-12-30 ENCOUNTER — Emergency Department (HOSPITAL_COMMUNITY): Payer: Medicare Other

## 2013-12-30 ENCOUNTER — Encounter (HOSPITAL_COMMUNITY): Payer: Self-pay | Admitting: Emergency Medicine

## 2013-12-30 ENCOUNTER — Emergency Department (HOSPITAL_COMMUNITY)
Admission: EM | Admit: 2013-12-30 | Discharge: 2013-12-30 | Disposition: A | Discharge status: Home / Self Care | Payer: Medicare Other | Attending: Emergency Medicine | Admitting: Emergency Medicine

## 2013-12-30 DIAGNOSIS — I129 Hypertensive chronic kidney disease with stage 1 through stage 4 chronic kidney disease, or unspecified chronic kidney disease: Secondary | ICD-10-CM | POA: Insufficient documentation

## 2013-12-30 DIAGNOSIS — Z794 Long term (current) use of insulin: Secondary | ICD-10-CM | POA: Insufficient documentation

## 2013-12-30 DIAGNOSIS — Z862 Personal history of diseases of the blood and blood-forming organs and certain disorders involving the immune mechanism: Secondary | ICD-10-CM | POA: Insufficient documentation

## 2013-12-30 DIAGNOSIS — Z79899 Other long term (current) drug therapy: Secondary | ICD-10-CM | POA: Insufficient documentation

## 2013-12-30 DIAGNOSIS — Z992 Dependence on renal dialysis: Secondary | ICD-10-CM | POA: Insufficient documentation

## 2013-12-30 DIAGNOSIS — Z7902 Long term (current) use of antithrombotics/antiplatelets: Secondary | ICD-10-CM | POA: Insufficient documentation

## 2013-12-30 DIAGNOSIS — Z8673 Personal history of transient ischemic attack (TIA), and cerebral infarction without residual deficits: Secondary | ICD-10-CM | POA: Insufficient documentation

## 2013-12-30 DIAGNOSIS — N189 Chronic kidney disease, unspecified: Secondary | ICD-10-CM | POA: Insufficient documentation

## 2013-12-30 DIAGNOSIS — M5416 Radiculopathy, lumbar region: Secondary | ICD-10-CM

## 2013-12-30 DIAGNOSIS — IMO0002 Reserved for concepts with insufficient information to code with codable children: Secondary | ICD-10-CM | POA: Insufficient documentation

## 2013-12-30 DIAGNOSIS — E119 Type 2 diabetes mellitus without complications: Secondary | ICD-10-CM | POA: Insufficient documentation

## 2013-12-30 DIAGNOSIS — M25559 Pain in unspecified hip: Secondary | ICD-10-CM | POA: Insufficient documentation

## 2013-12-30 DIAGNOSIS — Z8669 Personal history of other diseases of the nervous system and sense organs: Secondary | ICD-10-CM | POA: Insufficient documentation

## 2013-12-30 MED ORDER — MORPHINE SULFATE 4 MG/ML IJ SOLN
6.0000 mg | Freq: Once | INTRAMUSCULAR | Status: AC
  Administered 2013-12-30: 6 mg via INTRAMUSCULAR
  Filled 2013-12-30: qty 2

## 2013-12-30 MED ORDER — HYDROCODONE-ACETAMINOPHEN 7.5-325 MG PO TABS: 1.0000 | ORAL_TABLET | Freq: Four times a day (QID) | ORAL | Status: DC | PRN

## 2013-12-30 MED ORDER — PREDNISONE 20 MG PO TABS: 20.0000 mg | ORAL_TABLET | Freq: Every day | ORAL | Status: DC

## 2013-12-30 NOTE — ED Provider Notes (Signed)
CSN: [id number]     Arrival date & time 12/30/13  1715 History   First MD Initiated Contact with Patient 12/30/13 1954     Chief Complaint  Patient presents with  . Hip Pain     (Consider location/radiation/quality/duration/timing/severity/associated sxs/prior Treatment) HPI Patient presents to the emergency department with lower back and hip pain.  The patient, states, that this been ongoing for 2 weeks.  Patient was seen at [facility] for similar complaints and had a workup done.  The patient, states, that the pain medications are not working at home.  Patient denies chest pain, shortness of breath, nausea, vomiting, weakness, dizziness, diarrhea, fever, rash, dysuria, or syncope.  The patient, states, that movement and palpation make the pain, worse.  Nothing seems make the pain better.  Patient, states, that she did not follow up with anybody about her pain Past Medical History  Diagnosis Date  . Hypertension   . Cataract   . Diabetes mellitus   . Chronic kidney disease   . Shortness of breath   . Anemia   . CVA (cerebral infarction)   . Stroke   . Dialysis patient    Past Surgical History  Procedure Laterality Date  . Cataract extraction w/phaco  11/20/2011    Procedure: CATARACT EXTRACTION PHACO AND INTRAOCULAR LENS PLACEMENT (IOC);  Surgeon: Tonny Branch, MD;  Location: [facility];  Service: Ophthalmology;  Laterality: Right;  CDE 18.82  . Cataract extraction w/phaco Left 11/18/2012    Procedure: CATARACT EXTRACTION PHACO AND INTRAOCULAR LENS PLACEMENT (IOC);  Surgeon: Tonny Branch, MD;  Location: [facility];  Service: Ophthalmology;  Laterality: Left;  CDE: 18.97  . Colonoscopy N/A 02/09/2013    Procedure: COLONOSCOPY;  Surgeon: Rogene Houston, MD;  Location: [facility];  Service: Endoscopy;  Laterality: N/A;  305-moved to 220 Ann to notify pt  . Insertion of dialysis catheter Right 06/24/2013    Procedure: INSERTION OF DIALYSIS CATHETER: Ultrasound guided;  Surgeon: Serafina Mitchell, MD;  Location: [place];  Service: Vascular;  Laterality: Right;  . 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 Tee without cardioversion N/A 07/21/2013    Procedure: TRANSESOPHAGEAL ECHOCARDIOGRAM (TEE);  Surgeon: Dorothy Spark, MD;  Location: [place];  Service: Cardiovascular;  Laterality: N/A;  . Av fistula placement Right 09/08/2013    Procedure: CREATION OF RIGHT BRACHIAL CEPHALIC ARTERIOVENOUS FISTULA ;  Surgeon: Mal Misty, MD;  Location: [place];  Service: Vascular;  Laterality: Right;   Family History  Problem Relation Age of Onset  . Anesthesia problems Neg Hx   . Hypotension Neg Hx   . Malignant hyperthermia Neg Hx   . Pseudochol deficiency Neg Hx   . Cancer Sister   . Cancer Brother    History  Substance Use Topics  . Smoking status: Never Smoker   . Smokeless tobacco: Never Used  . Alcohol Use: No   OB History   Grav Para Term Preterm Abortions TAB SAB Ect Mult Living                 Review of Systems  All other systems negative except as documented in the HPI. All pertinent positives and negatives as reviewed in the HPI.  Allergies  Ambien and Reglan  Home Medications   Prior to Admission medications   Medication Sig Start Date End Date Taking? Authorizing Provider  clopidogrel (PLAVIX) 75 MG tablet Take 75 mg  by mouth daily with breakfast. 08/30/13  Yes Lavon Paganini Angiulli, PA-C  darbepoetin (ARANESP) 60 MCG/0.3ML SOLN injection Inject 0.3 mLs (60 mcg total) into the vein every Tuesday with hemodialysis. 08/23/13  Yes Geradine Girt, DO  doxercalciferol (HECTOROL) 4 MCG/2ML injection Inject 1 mcg into the vein every Monday, Wednesday, and Friday with hemodialysis.  08/23/13  Yes Jessica U Vann, DO  insulin NPH Human (HUMULIN N,NOVOLIN N) 100 UNIT/ML injection Inject 5 Units into the skin 2 (two) times daily at 8 am and 10 pm. 08/30/13  Yes Daniel J Angiulli, PA-C  labetalol (NORMODYNE) 200 MG tablet Take  200 mg by mouth 2 (two) times daily. 08/30/13  Yes Daniel J Angiulli, PA-C  multivitamin (RENA-VIT) TABS tablet Take 1 tablet by mouth at bedtime. 07/04/13  Yes Cherene Altes, MD  pantoprazole (PROTONIX) 40 MG tablet Take 40 mg by mouth daily.   Yes Historical Provider, MD   BP 201/73  Pulse 72  Temp(Src) 97.5 F (36.4 C) (Oral)  Resp 18  Ht 5\' 7"  (1.702 m)  Wt 165 lb (74.844 kg)  BMI 25.84 kg/m2  SpO2 100% Physical Exam  Constitutional: She is oriented to person, place, and time. She appears well-developed and well-nourished. No distress.  HENT:  Head: Normocephalic and atraumatic.  Mouth/Throat: Oropharynx is clear and moist.  Eyes: Pupils are equal, round, and reactive to light.  Neck: Normal range of motion. Neck supple.  Cardiovascular: Normal rate, regular rhythm and normal heart sounds.  Exam reveals no gallop and no friction rub.   No murmur heard. Pulmonary/Chest: Effort normal and breath sounds normal. No respiratory distress.  Musculoskeletal:       Lumbar back: She exhibits tenderness and pain. She exhibits normal range of motion, no bony tenderness, no deformity, no laceration and no spasm.       Back:  Neurological: She is alert and oriented to person, place, and time. She has normal reflexes. She exhibits normal muscle tone. Coordination normal.  Skin: Skin is warm and dry. No rash noted. No erythema.    ED Course  Procedures (including critical care time) Labs Review Labs Reviewed - No data to display  Imaging Review Dg Lumbar Spine Complete  12/30/2013   CLINICAL DATA:  Right-sided low back pain  EXAM: LUMBAR SPINE - COMPLETE 4+ VIEW  COMPARISON:  None.  FINDINGS: There are 5 nonrib bearing lumbar-type vertebral bodies. The vertebral body heights are maintained. The alignment is anatomic. There is no spondylolysis. There is no acute fracture or static listhesis. There is degenerative disc disease at L5-S1. There is severe facet arthropathy at L4-5.  The SI  joints are unremarkable.  IMPRESSION: 1. Severe facet arthropathy at L4-5. 2. Degenerative disc disease at L5-S1.   Electronically Signed   By: Kathreen Devoid   On: 12/30/2013 21:36    Patient retreated for lumbar radiculopathy and chronic degenerative disease in her lower back.  Patient does not have any neurological deficits noted on exam.  Told to follow with her primary care Dr. return here as needed    Brent General, PA-C 12/30/13 2233

## 2013-12-30 NOTE — ED Notes (Signed)
Rt arm fistula

## 2013-12-30 NOTE — Discharge Instructions (Signed)
Followup with your primary care Dr. return here as needed.  Use ice and heat on your lower back

## 2013-12-30 NOTE — ED Notes (Signed)
Pt points to right low flank area when asked where her pain is located.  Has been ongoing for two weeks.  Was seen at [facility] on 12/20/13, CT at that time and was given oxycodone but reports she is not helping her pain.

## 2013-12-30 NOTE — ED Provider Notes (Signed)
  Face-to-face evaluation   History: She complains of right low back pain, radiating to the lateral abdomen. The pain has been present for 2 weeks. The pain is worse with standing and moving. She was seen last week, and had a CT scan that was negative for acute abnormalities.  Physical exam: Alert, elderly, female, in mild discomfort. Back tender right lumbar. Negative straight leg raising bilaterally.   Medical screening examination/treatment/procedure(s) were conducted as a shared visit with non-physician practitioner(s) and myself.  I personally evaluated the patient during the encounter  Richarda Blade, MD 12/31/13 [phone]

## 2013-12-30 NOTE — ED Notes (Signed)
The pt is c/o rt hip for 2 weeks.  The pt is a dialysis pt  And did not complete her dialysis  Due to the pain.  She was seen at [facility] for the same one week ago.  Dx arthritis.  Today she has has nausea

## 2013-12-31 NOTE — ED Provider Notes (Deleted)
Medical screening examination/treatment/procedure(s) were performed by non-physician practitioner and as supervising physician I was immediately available for consultation/collaboration.  Richarda Blade, MD 12/31/13 [phone]

## 2014-01-16 ENCOUNTER — Encounter: Payer: Self-pay | Admitting: Vascular Surgery

## 2014-01-17 ENCOUNTER — Encounter: Payer: Self-pay | Admitting: *Deleted

## 2014-01-17 ENCOUNTER — Other Ambulatory Visit: Payer: Self-pay | Admitting: *Deleted

## 2014-01-17 ENCOUNTER — Ambulatory Visit (INDEPENDENT_AMBULATORY_CARE_PROVIDER_SITE_OTHER): Payer: Medicare Other | Admitting: Vascular Surgery

## 2014-01-17 ENCOUNTER — Ambulatory Visit (HOSPITAL_COMMUNITY)
Admission: RE | Admit: 2014-01-17 | Discharge: 2014-01-17 | Disposition: A | Discharge status: Home / Self Care | Payer: Medicare Other | Source: Ambulatory Visit | Attending: Vascular Surgery | Admitting: Vascular Surgery

## 2014-01-17 ENCOUNTER — Other Ambulatory Visit: Payer: Self-pay | Admitting: Vascular Surgery

## 2014-01-17 ENCOUNTER — Encounter: Payer: Self-pay | Admitting: Vascular Surgery

## 2014-01-17 VITALS — BP 179/77 | HR 80 | Resp 18 | Ht 67.0 in | Wt 160.4 lb

## 2014-01-17 DIAGNOSIS — Z4931 Encounter for adequacy testing for hemodialysis: Secondary | ICD-10-CM

## 2014-01-17 DIAGNOSIS — T82598A Other mechanical complication of other cardiac and vascular devices and implants, initial encounter: Secondary | ICD-10-CM | POA: Diagnosis present

## 2014-01-17 DIAGNOSIS — N186 End stage renal disease: Secondary | ICD-10-CM

## 2014-01-17 DIAGNOSIS — Z992 Dependence on renal dialysis: Secondary | ICD-10-CM | POA: Diagnosis not present

## 2014-01-17 NOTE — Progress Notes (Signed)
Patient has today for her difficulty with cannulization of her right radiocephalic AV fistula. This was placed Dr. Kellie Simmering on 09/08/2013. She does have a hemodialysis catheter in her right internal jugular vein and is having adequate dialysis with this. There have been attempts but she did have infiltration of her fistula.  Past Medical History  Diagnosis Date  . Hypertension   . Cataract   . Diabetes mellitus   . Chronic kidney disease   . Shortness of breath   . Anemia   . CVA (cerebral infarction)   . Stroke   . Dialysis patient     History  Substance Use Topics  . Smoking status: Never Smoker   . Smokeless tobacco: Never Used  . Alcohol Use: No    Family History  Problem Relation Age of Onset  . Anesthesia problems Neg Hx   . Hypotension Neg Hx   . Malignant hyperthermia Neg Hx   . Pseudochol deficiency Neg Hx   . Cancer Sister   . Cancer Brother     Allergies  Allergen Reactions  . Ambien [Zolpidem] Other (See Comments)    Hallucinations   . Reglan [Metoclopramide] Other (See Comments)    Hallucinations     Current outpatient prescriptions:darbepoetin (ARANESP) 60 MCG/0.3ML SOLN injection, Inject 0.3 mLs (60 mcg total) into the vein every Tuesday with hemodialysis., Disp: 4.2 mL, Rfl: ;  doxercalciferol (HECTOROL) 4 MCG/2ML injection, Inject 1 mcg into the vein every Monday, Wednesday, and Friday with hemodialysis. , Disp: , Rfl:  HYDROcodone-acetaminophen (NORCO) 7.5-325 MG per tablet, Take 1 tablet by mouth every 6 (six) hours as needed for moderate pain., Disp: 20 tablet, Rfl: 0;  insulin NPH Human (HUMULIN N,NOVOLIN N) 100 UNIT/ML injection, Inject 5 Units into the skin 2 (two) times daily at 8 am and 10 pm., Disp: , Rfl: ;  labetalol (NORMODYNE) 200 MG tablet, Take 200 mg by mouth 2 (two) times daily., Disp: , Rfl:  multivitamin (RENA-VIT) TABS tablet, Take 1 tablet by mouth at bedtime., Disp: 30 tablet, Rfl: 0;  pantoprazole (PROTONIX) 40 MG tablet, Take 40 mg by  mouth daily., Disp: , Rfl: ;  clopidogrel (PLAVIX) 75 MG tablet, Take 75 mg by mouth daily with breakfast., Disp: , Rfl: ;  predniSONE (DELTASONE) 20 MG tablet, Take 1 tablet (20 mg total) by mouth daily., Disp: 5 tablet, Rfl: 0  BP 179/77  Pulse 80  Resp 18  Ht 5\' 7"  (1.702 m)  Wt 160 lb 6.4 oz (72.757 kg)  BMI 25.12 kg/m2  Body mass index is 25.12 kg/(m^2).       On physical exam she does have some bruising over fistula. She does have a thrill r stent but the vein does appear to be quite small by physical exam. Physical exam she does have some dilatation of her antecubital vein to this does appear to drain into the basilic system.  She underwent a formal venous duplex in our office. This showed very high-grade this a sclerotic area in the vein over several centimeters near the wrist. The vein at the antecubital space was proximally 5 mm.  I imaged the vein itself with SonoSite ultrasound and she has essentially no upper arm cephalic vein was all outflow from her forearm cephalic vein draining into the basilic system. The basilic vein appears to be dilated do to her forearm cephalic vein fistula.  And long discussion with the patient and her daughter present. I explained we could opt to attempt angioplasty of the cephalic  vein fistula but I feel that despite the 5 months it has very poor maturation in with a long segment sclerosis in the distal portion I feel that there is very little chance for long-term success of this. I have recommended basilic vein transposition. I explained that she has had dilatation of the basilic vein the 2 outflow from her forearm fistula into this. She dialyzes on Monday Wednesday and Friday. Has been scheduled for Dr. Kellie Simmering for right basilic vein transposition on 01/26/2014

## 2014-01-18 ENCOUNTER — Encounter (HOSPITAL_COMMUNITY): Payer: Self-pay | Admitting: Pharmacy Technician

## 2014-01-25 ENCOUNTER — Encounter (HOSPITAL_COMMUNITY): Payer: Self-pay | Admitting: *Deleted

## 2014-01-25 MED ORDER — DEXTROSE 5 % IV SOLN
1.5000 g | INTRAVENOUS | Status: AC
  Administered 2014-01-26: 1.5 g via INTRAVENOUS

## 2014-01-25 MED ORDER — CHLORHEXIDINE GLUCONATE CLOTH 2 % EX PADS: 6.0000 | MEDICATED_PAD | Freq: Once | CUTANEOUS | Status: DC

## 2014-01-25 MED ORDER — SODIUM CHLORIDE 0.9 % IV SOLN
INTRAVENOUS | Status: DC
  Administered 2014-01-26: 07:00:00 via INTRAVENOUS

## 2014-01-25 NOTE — Progress Notes (Signed)
Pt's daughter, [name] verified medical and surgical history, meds and allergies for pre-op call. She was given pre-op instructions - time of arrival (5:30 AM), NPO after MN tonight, meds to take in AM.

## 2014-01-26 ENCOUNTER — Encounter (HOSPITAL_COMMUNITY): Payer: Medicare Other | Admitting: Certified Registered"

## 2014-01-26 ENCOUNTER — Encounter (HOSPITAL_COMMUNITY)
Admission: RE | Disposition: A | Discharge status: Home / Self Care | Payer: Self-pay | Source: Ambulatory Visit | Attending: Vascular Surgery

## 2014-01-26 ENCOUNTER — Encounter (HOSPITAL_COMMUNITY): Payer: Self-pay | Admitting: *Deleted

## 2014-01-26 ENCOUNTER — Ambulatory Visit (HOSPITAL_COMMUNITY)
Admission: RE | Admit: 2014-01-26 | Discharge: 2014-01-26 | Disposition: A | Discharge status: Home / Self Care | Payer: Medicare Other | Source: Ambulatory Visit | Attending: Vascular Surgery | Admitting: Vascular Surgery

## 2014-01-26 ENCOUNTER — Other Ambulatory Visit: Payer: Self-pay | Admitting: *Deleted

## 2014-01-26 ENCOUNTER — Ambulatory Visit (HOSPITAL_COMMUNITY): Payer: Medicare Other | Admitting: Certified Registered"

## 2014-01-26 ENCOUNTER — Telehealth: Payer: Self-pay | Admitting: Vascular Surgery

## 2014-01-26 DIAGNOSIS — E119 Type 2 diabetes mellitus without complications: Secondary | ICD-10-CM | POA: Diagnosis not present

## 2014-01-26 DIAGNOSIS — Z8673 Personal history of transient ischemic attack (TIA), and cerebral infarction without residual deficits: Secondary | ICD-10-CM | POA: Diagnosis not present

## 2014-01-26 DIAGNOSIS — Z79899 Other long term (current) drug therapy: Secondary | ICD-10-CM | POA: Insufficient documentation

## 2014-01-26 DIAGNOSIS — I120 Hypertensive chronic kidney disease with stage 5 chronic kidney disease or end stage renal disease: Secondary | ICD-10-CM | POA: Diagnosis not present

## 2014-01-26 DIAGNOSIS — Y832 Surgical operation with anastomosis, bypass or graft as the cause of abnormal reaction of the patient, or of later complication, without mention of misadventure at the time of the procedure: Secondary | ICD-10-CM | POA: Diagnosis not present

## 2014-01-26 DIAGNOSIS — Z888 Allergy status to other drugs, medicaments and biological substances status: Secondary | ICD-10-CM | POA: Diagnosis not present

## 2014-01-26 DIAGNOSIS — N186 End stage renal disease: Secondary | ICD-10-CM

## 2014-01-26 DIAGNOSIS — K219 Gastro-esophageal reflux disease without esophagitis: Secondary | ICD-10-CM | POA: Insufficient documentation

## 2014-01-26 DIAGNOSIS — Z992 Dependence on renal dialysis: Secondary | ICD-10-CM | POA: Diagnosis not present

## 2014-01-26 DIAGNOSIS — Z794 Long term (current) use of insulin: Secondary | ICD-10-CM | POA: Diagnosis not present

## 2014-01-26 DIAGNOSIS — T82898A Other specified complication of vascular prosthetic devices, implants and grafts, initial encounter: Secondary | ICD-10-CM | POA: Insufficient documentation

## 2014-01-26 DIAGNOSIS — Z4931 Encounter for adequacy testing for hemodialysis: Secondary | ICD-10-CM

## 2014-01-26 HISTORY — PX: BASCILIC VEIN TRANSPOSITION: SHX5742

## 2014-01-26 HISTORY — PX: LIGATION OF ARTERIOVENOUS  FISTULA: SHX5948

## 2014-01-26 HISTORY — DX: Gastro-esophageal reflux disease without esophagitis: K21.9

## 2014-01-26 LAB — GLUCOSE, CAPILLARY
GLUCOSE-CAPILLARY: 297 mg/dL — AB (ref 70–99)
Glucose-Capillary: 224 mg/dL — ABNORMAL HIGH (ref 70–99)
Glucose-Capillary: 308 mg/dL — ABNORMAL HIGH (ref 70–99)
Glucose-Capillary: 321 mg/dL — ABNORMAL HIGH (ref 70–99)

## 2014-01-26 LAB — POCT I-STAT 4, (NA,K, GLUC, HGB,HCT)
Glucose, Bld: 233 mg/dL — ABNORMAL HIGH (ref 70–99)
HCT: 35 % — ABNORMAL LOW (ref 36.0–46.0)
HEMOGLOBIN: 11.9 g/dL — AB (ref 12.0–15.0)
POTASSIUM: 5.4 meq/L — AB (ref 3.7–5.3)
SODIUM: 134 meq/L — AB (ref 137–147)

## 2014-01-26 SURGERY — TRANSPOSITION, VEIN, BASILIC
Anesthesia: General | Site: Arm Upper | Laterality: Right

## 2014-01-26 MED ORDER — INSULIN ASPART 100 UNIT/ML ~~LOC~~ SOLN
SUBCUTANEOUS | Status: AC
  Filled 2014-01-26: qty 5

## 2014-01-26 MED ORDER — PROPOFOL 10 MG/ML IV BOLUS
INTRAVENOUS | Status: DC | PRN
  Administered 2014-01-26: 20 mg via INTRAVENOUS
  Administered 2014-01-26: 100 mg via INTRAVENOUS

## 2014-01-26 MED ORDER — OXYCODONE HCL 5 MG/5ML PO SOLN: 5.0000 mg | Freq: Once | ORAL | Status: DC | PRN

## 2014-01-26 MED ORDER — MEPERIDINE HCL 25 MG/ML IJ SOLN: 6.2500 mg | INTRAMUSCULAR | Status: DC | PRN

## 2014-01-26 MED ORDER — LABETALOL HCL 5 MG/ML IV SOLN
INTRAVENOUS | Status: AC
  Filled 2014-01-26: qty 4

## 2014-01-26 MED ORDER — DEXAMETHASONE SODIUM PHOSPHATE 4 MG/ML IJ SOLN
INTRAMUSCULAR | Status: DC | PRN
  Administered 2014-01-26: 4 mg via INTRAVENOUS

## 2014-01-26 MED ORDER — PROMETHAZINE HCL 25 MG/ML IJ SOLN
INTRAMUSCULAR | Status: AC
  Filled 2014-01-26: qty 1

## 2014-01-26 MED ORDER — INSULIN ASPART 100 UNIT/ML ~~LOC~~ SOLN: 5.0000 [IU] | Freq: Once | SUBCUTANEOUS | Status: DC

## 2014-01-26 MED ORDER — FENTANYL CITRATE 0.05 MG/ML IJ SOLN
25.0000 ug | INTRAMUSCULAR | Status: DC | PRN
  Administered 2014-01-26: 50 ug via INTRAVENOUS

## 2014-01-26 MED ORDER — LIDOCAINE-EPINEPHRINE (PF) 1 %-1:200000 IJ SOLN
INTRAMUSCULAR | Status: AC
  Filled 2014-01-26: qty 10

## 2014-01-26 MED ORDER — FENTANYL CITRATE 0.05 MG/ML IJ SOLN
INTRAMUSCULAR | Status: AC
  Filled 2014-01-26: qty 2

## 2014-01-26 MED ORDER — ONDANSETRON HCL 4 MG/2ML IJ SOLN
INTRAMUSCULAR | Status: AC
  Filled 2014-01-26: qty 2

## 2014-01-26 MED ORDER — FENTANYL CITRATE 0.05 MG/ML IJ SOLN
INTRAMUSCULAR | Status: DC | PRN
  Administered 2014-01-26: 50 ug via INTRAVENOUS
  Administered 2014-01-26 (×2): 25 ug via INTRAVENOUS
  Administered 2014-01-26 (×2): 50 ug via INTRAVENOUS

## 2014-01-26 MED ORDER — INSULIN ASPART 100 UNIT/ML ~~LOC~~ SOLN
5.0000 [IU] | Freq: Once | SUBCUTANEOUS | Status: AC
  Administered 2014-01-26: 5 [IU] via SUBCUTANEOUS

## 2014-01-26 MED ORDER — LIDOCAINE HCL (CARDIAC) 20 MG/ML IV SOLN
INTRAVENOUS | Status: AC
  Filled 2014-01-26: qty 5

## 2014-01-26 MED ORDER — DEXAMETHASONE SODIUM PHOSPHATE 4 MG/ML IJ SOLN
INTRAMUSCULAR | Status: AC
  Filled 2014-01-26: qty 1

## 2014-01-26 MED ORDER — 0.9 % SODIUM CHLORIDE (POUR BTL) OPTIME
TOPICAL | Status: DC | PRN
  Administered 2014-01-26: 1000 mL

## 2014-01-26 MED ORDER — ONDANSETRON HCL 4 MG/2ML IJ SOLN
INTRAMUSCULAR | Status: DC | PRN
  Administered 2014-01-26: 4 mg via INTRAVENOUS

## 2014-01-26 MED ORDER — SODIUM CHLORIDE 0.9 % IR SOLN
Status: DC | PRN
  Administered 2014-01-26: 07:00:00

## 2014-01-26 MED ORDER — OXYCODONE HCL 5 MG PO TABS: 5.0000 mg | ORAL_TABLET | Freq: Four times a day (QID) | ORAL | Status: DC | PRN

## 2014-01-26 MED ORDER — PROMETHAZINE HCL 25 MG/ML IJ SOLN
6.2500 mg | INTRAMUSCULAR | Status: AC | PRN
  Administered 2014-01-26 (×2): 6.25 mg via INTRAVENOUS

## 2014-01-26 MED ORDER — OXYCODONE HCL 5 MG PO TABS: 5.0000 mg | ORAL_TABLET | Freq: Once | ORAL | Status: DC | PRN

## 2014-01-26 MED ORDER — FENTANYL CITRATE 0.05 MG/ML IJ SOLN
INTRAMUSCULAR | Status: AC
  Filled 2014-01-26: qty 5

## 2014-01-26 MED ORDER — LABETALOL HCL 5 MG/ML IV SOLN
INTRAVENOUS | Status: DC | PRN
  Administered 2014-01-26: 5 mg via INTRAVENOUS
  Administered 2014-01-26: 2.5 mg via INTRAVENOUS
  Administered 2014-01-26: 5 mg via INTRAVENOUS

## 2014-01-26 MED ORDER — PROPOFOL 10 MG/ML IV BOLUS
INTRAVENOUS | Status: AC
  Filled 2014-01-26: qty 20

## 2014-01-26 MED ORDER — SUCCINYLCHOLINE CHLORIDE 20 MG/ML IJ SOLN
INTRAMUSCULAR | Status: AC
  Filled 2014-01-26: qty 1

## 2014-01-26 SURGICAL SUPPLY — 43 items
ADH SKN CLS APL DERMABOND .7 (GAUZE/BANDAGES/DRESSINGS) ×1
BLADE 10 SAFETY STRL DISP (BLADE) ×4 IMPLANT
CANISTER SUCTION 2500CC (MISCELLANEOUS) ×4 IMPLANT
CLIP TI MEDIUM 24 (CLIP) ×4 IMPLANT
CLIP TI WIDE RED SMALL 24 (CLIP) ×4 IMPLANT
COVER PROBE W GEL 5X96 (DRAPES) ×4 IMPLANT
COVER SURGICAL LIGHT HANDLE (MISCELLANEOUS) ×4 IMPLANT
DERMABOND ADVANCED (GAUZE/BANDAGES/DRESSINGS) ×2
DERMABOND ADVANCED .7 DNX12 (GAUZE/BANDAGES/DRESSINGS) ×2 IMPLANT
ELECT REM PT RETURN 9FT ADLT (ELECTROSURGICAL) ×4
ELECTRODE REM PT RTRN 9FT ADLT (ELECTROSURGICAL) ×2 IMPLANT
GEL ULTRASOUND 20GR AQUASONIC (MISCELLANEOUS) IMPLANT
GLOVE BIO SURGEON STRL SZ 6.5 (GLOVE) ×6 IMPLANT
GLOVE BIO SURGEONS STRL SZ 6.5 (GLOVE) ×2
GLOVE BIOGEL PI IND STRL 6.5 (GLOVE) ×2 IMPLANT
GLOVE BIOGEL PI IND STRL 7.0 (GLOVE) ×4 IMPLANT
GLOVE BIOGEL PI INDICATOR 6.5 (GLOVE) ×2
GLOVE BIOGEL PI INDICATOR 7.0 (GLOVE) ×4
GLOVE SKINSENSE NS SZ7.0 (GLOVE) ×2
GLOVE SKINSENSE STRL SZ7.0 (GLOVE) ×2 IMPLANT
GLOVE SS BIOGEL STRL SZ 7 (GLOVE) ×2 IMPLANT
GLOVE SUPERSENSE BIOGEL SZ 7 (GLOVE) ×2
GOWN BRE IMP SLV AUR XL STRL (GOWN DISPOSABLE) ×4 IMPLANT
GOWN STRL REUS W/ TWL LRG LVL3 (GOWN DISPOSABLE) ×6 IMPLANT
GOWN STRL REUS W/TWL LRG LVL3 (GOWN DISPOSABLE) ×9
KIT BASIN OR (CUSTOM PROCEDURE TRAY) ×4 IMPLANT
KIT ROOM TURNOVER OR (KITS) ×4 IMPLANT
NS IRRIG 1000ML POUR BTL (IV SOLUTION) ×4 IMPLANT
PACK CV ACCESS (CUSTOM PROCEDURE TRAY) ×4 IMPLANT
PAD ARMBOARD 7.5X6 YLW CONV (MISCELLANEOUS) ×8 IMPLANT
SUT PROLENE 6 0 BV (SUTURE) ×4 IMPLANT
SUT PROLENE 7 0 BV175 8 (SUTURE) ×8 IMPLANT
SUT SILK 2 0 SH (SUTURE) ×4 IMPLANT
SUT SILK 3 0 (SUTURE) ×2
SUT SILK 3-0 18XBRD TIE 12 (SUTURE) ×2 IMPLANT
SUT SILK 4 0 TIES 17X18 (SUTURE) ×4 IMPLANT
SUT VIC AB 3-0 SH 27 (SUTURE) ×10
SUT VIC AB 3-0 SH 27X BRD (SUTURE) ×10 IMPLANT
SUT VICRYL 4-0 PS2 18IN ABS (SUTURE) ×4 IMPLANT
TOWEL OR 17X24 6PK STRL BLUE (TOWEL DISPOSABLE) ×4 IMPLANT
TOWEL OR 17X26 10 PK STRL BLUE (TOWEL DISPOSABLE) ×4 IMPLANT
UNDERPAD 30X30 INCONTINENT (UNDERPADS AND DIAPERS) ×4 IMPLANT
WATER STERILE IRR 1000ML POUR (IV SOLUTION) ×4 IMPLANT

## 2014-01-26 NOTE — Discharge Instructions (Signed)

## 2014-01-26 NOTE — Transfer of Care (Signed)
Immediate Anesthesia Transfer of Care Note  Patient: [name]  Procedure(s) Performed: Procedure(s): Right Arm BASILIC VEIN TRANSPOSITION (Right) LIGATION OF ARTERIOVENOUS  FISTULA (Right)  Patient Location: PACU  Anesthesia Type:General  Level of Consciousness: awake and oriented  Airway & Oxygen Therapy: Patient Spontanous Breathing and Patient connected to nasal cannula oxygen  Post-op Assessment: Report given to PACU RN, Post -op Vital signs reviewed and stable and Patient moving all extremities  Post vital signs: Reviewed and stable  Complications: No apparent anesthesia complications

## 2014-01-26 NOTE — Anesthesia Preprocedure Evaluation (Addendum)
Anesthesia Evaluation  Patient identified by MRN, date of birth, ID band Patient awake    Reviewed: Allergy & Precautions, H&P , NPO status , Patient's Chart, lab work & pertinent test results, reviewed documented beta blocker date and time   History of Anesthesia Complications Negative for: history of anesthetic complications  Airway Mallampati: II TM Distance: >3 FB Neck ROM: Full    Dental  (+) Edentulous Upper, Edentulous Lower   Pulmonary shortness of breath,  breath sounds clear to auscultation        Cardiovascular hypertension, Pt. on medications and Pt. 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), Well Controlled, Type 2, Insulin Dependent  Renal/GU ESRF and DialysisRenal disease (MWF, K+ 5.4)     Musculoskeletal   Abdominal   Peds  Hematology   Anesthesia Other Findings   Reproductive/Obstetrics                        Anesthesia Physical Anesthesia Plan  ASA: III  Anesthesia Plan: General   Post-op Pain Management:    Induction: Intravenous  Airway Management Planned: LMA  Additional Equipment:   Intra-op Plan:   Post-operative Plan: Extubation in OR  Informed Consent: I have reviewed the patients History and Physical, chart, labs and discussed the procedure including the risks, benefits and alternatives for the proposed anesthesia with the patient or authorized representative who has indicated his/her understanding and acceptance.   Dental advisory given  Plan Discussed with: CRNA, Anesthesiologist and Surgeon  Anesthesia Plan Comments: (Plan routine monitors, GA- LMA OK)       Anesthesia Quick Evaluation

## 2014-01-26 NOTE — Anesthesia Postprocedure Evaluation (Signed)
  Anesthesia Post-op Note  Patient: [name]  Procedure(s) Performed: Procedure(s): Right Arm BASILIC VEIN TRANSPOSITION (Right) LIGATION OF ARTERIOVENOUS  FISTULA (Right)  Patient Location: PACU  Anesthesia Type:General  Level of Consciousness: awake, alert , oriented and patient cooperative  Airway and Oxygen Therapy: Patient Spontanous Breathing  Post-op Pain: none  Post-op Assessment: Post-op Vital signs reviewed, Patient's Cardiovascular Status Stable, Respiratory Function Stable, Patent Airway, No signs of Nausea or vomiting and Pain level controlled  Post-op Vital Signs: Reviewed and stable  Last Vitals:  Filed Vitals:   01/26/14 1100  BP: 195/78  Pulse: 85  Temp:   Resp: 13    Complications: No apparent anesthesia complications

## 2014-01-26 NOTE — Progress Notes (Signed)
Dr Glennon Mac notified of resolving nausea and improved BP / now 124/75, but cbg 321--> add'l 5u novalog sq ordered. May be d/c when cbg trending downward and nausea is controlled

## 2014-01-26 NOTE — Interval H&P Note (Signed)
History and Physical Interval Note:  01/26/2014 7:29 AM  [name]  has presented today for surgery, with the diagnosis of End stage renal disease   The various methods of treatment have been discussed with the patient and family. After consideration of risks, benefits and other options for treatment, the patient has consented to  Procedure(s): BASILIC VEIN TRANSPOSITION (Right) as a surgical intervention .  The patient's history has been reviewed, patient examined, no change in status, stable for surgery.  I have reviewed the patient's chart and labs.  Questions were answered to the patient's satisfaction.     Tinnie Gens

## 2014-01-26 NOTE — H&P (View-Only) (Signed)
Patient has today for her difficulty with cannulization of her right radiocephalic AV fistula. This was placed Dr. Kellie Simmering on 09/08/2013. She does have a hemodialysis catheter in her right internal jugular vein and is having adequate dialysis with this. There have been attempts but she did have infiltration of her fistula.  Past Medical History  Diagnosis Date  . Hypertension   . Cataract   . Diabetes mellitus   . Chronic kidney disease   . Shortness of breath   . Anemia   . CVA (cerebral infarction)   . Stroke   . Dialysis patient     History  Substance Use Topics  . Smoking status: Never Smoker   . Smokeless tobacco: Never Used  . Alcohol Use: No    Family History  Problem Relation Age of Onset  . Anesthesia problems Neg Hx   . Hypotension Neg Hx   . Malignant hyperthermia Neg Hx   . Pseudochol deficiency Neg Hx   . Cancer Sister   . Cancer Brother     Allergies  Allergen Reactions  . Ambien [Zolpidem] Other (See Comments)    Hallucinations   . Reglan [Metoclopramide] Other (See Comments)    Hallucinations     Current outpatient prescriptions:darbepoetin (ARANESP) 60 MCG/0.3ML SOLN injection, Inject 0.3 mLs (60 mcg total) into the vein every Tuesday with hemodialysis., Disp: 4.2 mL, Rfl: ;  doxercalciferol (HECTOROL) 4 MCG/2ML injection, Inject 1 mcg into the vein every Monday, Wednesday, and Friday with hemodialysis. , Disp: , Rfl:  HYDROcodone-acetaminophen (NORCO) 7.5-325 MG per tablet, Take 1 tablet by mouth every 6 (six) hours as needed for moderate pain., Disp: 20 tablet, Rfl: 0;  insulin NPH Human (HUMULIN N,NOVOLIN N) 100 UNIT/ML injection, Inject 5 Units into the skin 2 (two) times daily at 8 am and 10 pm., Disp: , Rfl: ;  labetalol (NORMODYNE) 200 MG tablet, Take 200 mg by mouth 2 (two) times daily., Disp: , Rfl:  multivitamin (RENA-VIT) TABS tablet, Take 1 tablet by mouth at bedtime., Disp: 30 tablet, Rfl: 0;  pantoprazole (PROTONIX) 40 MG tablet, Take 40 mg by  mouth daily., Disp: , Rfl: ;  clopidogrel (PLAVIX) 75 MG tablet, Take 75 mg by mouth daily with breakfast., Disp: , Rfl: ;  predniSONE (DELTASONE) 20 MG tablet, Take 1 tablet (20 mg total) by mouth daily., Disp: 5 tablet, Rfl: 0  BP 179/77  Pulse 80  Resp 18  Ht 5\' 7"  (1.702 m)  Wt 160 lb 6.4 oz (72.757 kg)  BMI 25.12 kg/m2  Body mass index is 25.12 kg/(m^2).       On physical exam she does have some bruising over fistula. She does have a thrill r stent but the vein does appear to be quite small by physical exam. Physical exam she does have some dilatation of her antecubital vein to this does appear to drain into the basilic system.  She underwent a formal venous duplex in our office. This showed very high-grade this a sclerotic area in the vein over several centimeters near the wrist. The vein at the antecubital space was proximally 5 mm.  I imaged the vein itself with SonoSite ultrasound and she has essentially no upper arm cephalic vein was all outflow from her forearm cephalic vein draining into the basilic system. The basilic vein appears to be dilated do to her forearm cephalic vein fistula.  And long discussion with the patient and her daughter present. I explained we could opt to attempt angioplasty of the cephalic  vein fistula but I feel that despite the 5 months it has very poor maturation in with a long segment sclerosis in the distal portion I feel that there is very little chance for long-term success of this. I have recommended basilic vein transposition. I explained that she has had dilatation of the basilic vein the 2 outflow from her forearm fistula into this. She dialyzes on Monday Wednesday and Friday. Has been scheduled for Dr. Kellie Simmering for right basilic vein transposition on 01/26/2014

## 2014-01-26 NOTE — Telephone Encounter (Addendum)
Message copied by Doristine Section on Thu Jan 26, 2014 12:26 PM ------      Message from: Peter Minium K      Created: Thu Jan 26, 2014 10:27 AM      Regarding: Schedule                   ----- Message -----         From: Alvia Grove, PA-C         Sent: 01/26/2014  10:01 AM           To: [facility] Charge Pool            S/p ligation of right RC AVF and R basilic vein transposition 01/26/14            F/u with Dr. Kellie Simmering in 6 weeks.            Thanks,      Maudie Mercury ------  notified patient's daughter of post op visit on 03-14-14 at 8:45 with dr. Kellie Simmering

## 2014-01-26 NOTE — Op Note (Signed)
OPERATIVE REPORT  Date of Surgery: 01/26/2014  Surgeon: Tinnie Gens, MD  Assistant: Posey Pronto  Pre-op Diagnosis: End stage renal disease with poorly developing right radial-cephalic AV fistula Post-op Diagnos sameis    Procedure: Procedure(s): Right Arm BASILIC VEIN TRANSPOSITION LIGATION OF ARTERIOVENOUS  FISTULA -- right radial cephalic  Anesthesia: LMA  EBL: Minimal  Complications: None  Procedure Details: The patient was taken to the operating room placed in supine position at which time the right upper extremity was prepped with Betadine scrub and solution draped in routine sterile manner. Satisfactory Gen.-LMA anesthesia was blistered. The basilic vein in the right arm was then marked using the SonoSite ultrasound from the proximal forearm to the axilla. There was a functioning radial/cephalic AV fistula but it had not developed well enough to utilize. Basilic vein was mobilized through 3 incisions along the medial aspect of the arm down to just below the antecubital area branches ligated between 4-0 silk ties and divided it was transected distally gently dilated with heparinized saline marked for orientation purposes. Pain. Brachial artery was exposed in the distal upper arm incisions are pulled Vesseloops. Was relatively small in caliber but had an excellent pulse. Basilic vein was intolerable on the anterior aspect of the arm in a curvilinear fashion being careful not to kink the vein it was delivered through the tunnel. Brachial artery is occluded proximally and distally with Vesseloops a 15 blade extended with Potts scissors. Was excellent flow. Minus carefully measured to length anastomosed in the same 6-0 Prolene. A slit 2 L excellent pulse and palpable thrill in the vein which is readily accessible and subcutaneous tissue. Attention turned to the wrist where the previous incision was opened where the fistula had been created M.D. radiocephalic fistula was dissected down to its  anastomosis to the radial artery. Cephalic vein was ligated with 2-0 silk tie and there continued to be excellent flow in the radial artery which improved with temporary compression of the basilic vein transposition. Adequate hemostasis was achieved and the wounds closed in layers of Vicryl in subcuticular fashion and Dermabond patient taken to recovery in stable condition    Tinnie Gens, MD 01/26/2014 10:12 AM

## 2014-01-26 NOTE — Anesthesia Procedure Notes (Signed)
Procedure Name: LMA Insertion Date/Time: 01/26/2014 7:42 AM Performed by: Melina Copa, Georgeanne Frankland R Pre-anesthesia Checklist: Patient identified, Emergency Drugs available, Suction available, Patient being monitored and Timeout performed Patient Re-evaluated:Patient Re-evaluated prior to inductionOxygen Delivery Method: Circle system utilized Preoxygenation: Pre-oxygenation with 100% oxygen Intubation Type: IV induction Ventilation: Mask ventilation without difficulty LMA: LMA inserted LMA Size: 5.0 Number of attempts: 1 Placement Confirmation: positive ETCO2 and breath sounds checked- equal and bilateral Tube secured with: Tape Dental Injury: Teeth and Oropharynx as per pre-operative assessment

## 2014-01-26 NOTE — Progress Notes (Signed)
Karna Dupes, CRNA notified of patient's BP

## 2014-01-27 ENCOUNTER — Encounter (HOSPITAL_COMMUNITY): Payer: Self-pay | Admitting: Vascular Surgery

## 2014-01-31 ENCOUNTER — Ambulatory Visit: Payer: Medicare Other | Admitting: Physical Medicine & Rehabilitation

## 2014-02-07 ENCOUNTER — Ambulatory Visit (HOSPITAL_COMMUNITY)
Admission: RE | Admit: 2014-02-07 | Payer: Medicare Other | Source: Ambulatory Visit | Attending: Family Medicine | Admitting: Family Medicine

## 2014-02-09 ENCOUNTER — Ambulatory Visit (HOSPITAL_COMMUNITY)
Admission: RE | Admit: 2014-02-09 | Payer: Medicare Other | Source: Ambulatory Visit | Attending: Family Medicine | Admitting: Family Medicine

## 2014-02-13 ENCOUNTER — Ambulatory Visit (HOSPITAL_COMMUNITY): Payer: Medicare Other | Admitting: Physical Therapy

## 2014-02-14 ENCOUNTER — Ambulatory Visit (HOSPITAL_COMMUNITY)
Admission: RE | Admit: 2014-02-14 | Discharge: 2014-02-14 | Disposition: A | Discharge status: Home / Self Care | Payer: Medicare Other | Source: Ambulatory Visit | Attending: Family Medicine | Admitting: Family Medicine

## 2014-02-14 ENCOUNTER — Ambulatory Visit: Payer: Medicare Other | Admitting: Physical Medicine & Rehabilitation

## 2014-02-14 ENCOUNTER — Encounter: Payer: Medicare Other | Attending: Physical Medicine & Rehabilitation

## 2014-02-14 DIAGNOSIS — R262 Difficulty in walking, not elsewhere classified: Secondary | ICD-10-CM | POA: Insufficient documentation

## 2014-02-14 DIAGNOSIS — I10 Essential (primary) hypertension: Secondary | ICD-10-CM | POA: Diagnosis not present

## 2014-02-14 DIAGNOSIS — IMO0001 Reserved for inherently not codable concepts without codable children: Secondary | ICD-10-CM | POA: Diagnosis not present

## 2014-02-14 DIAGNOSIS — R209 Unspecified disturbances of skin sensation: Secondary | ICD-10-CM | POA: Diagnosis not present

## 2014-02-14 DIAGNOSIS — E119 Type 2 diabetes mellitus without complications: Secondary | ICD-10-CM | POA: Insufficient documentation

## 2014-02-14 DIAGNOSIS — I69959 Hemiplegia and hemiparesis following unspecified cerebrovascular disease affecting unspecified side: Secondary | ICD-10-CM | POA: Diagnosis not present

## 2014-02-14 NOTE — Evaluation (Addendum)
Physical Therapy Evaluation  Patient Details  Name: JAMISA STANWOOD MRN: [id number] Date of Birth: [birth year]  Today's Date: 02/14/2014 Time: [id number] PT Time Calculation (min): 35 min     Charges: 1 Eval, TE 1005-1015         Visit#: 1 of 16  Re-eval: 03/16/14 Assessment Diagnosis: Rt Low back pain secondary to stiffness in pelvis and generalized weakness Next MD Visit: Dr. [name]. Refferal from [place]: Medicare     Past Medical History:  Past Medical History  Diagnosis Date  . Hypertension   . Cataract   . Chronic kidney disease   . Shortness of breath   . Anemia   . CVA (cerebral infarction)   . Dialysis patient   . Diabetes mellitus     Type 2  . Stroke     right side weakness  . GERD (gastroesophageal reflux disease)    Past Surgical History:  Past Surgical History  Procedure Laterality Date  . Cataract extraction w/phaco  11/20/2011    Procedure: CATARACT EXTRACTION PHACO AND INTRAOCULAR LENS PLACEMENT (IOC);  Surgeon: Tonny Branch, MD;  Location: [facility];  Service: Ophthalmology;  Laterality: Right;  CDE 18.82  . Cataract extraction w/phaco Left 11/18/2012    Procedure: CATARACT EXTRACTION PHACO AND INTRAOCULAR LENS PLACEMENT (IOC);  Surgeon: Tonny Branch, MD;  Location: [facility];  Service: Ophthalmology;  Laterality: Left;  CDE: 18.97  . Colonoscopy N/A 02/09/2013    Procedure: COLONOSCOPY;  Surgeon: Rogene Houston, MD;  Location: [facility];  Service: Endoscopy;  Laterality: N/A;  305-moved to 220 Ann to notify pt  . Insertion of dialysis catheter Right 06/24/2013    Procedure: INSERTION OF DIALYSIS CATHETER: Ultrasound guided;  Surgeon: Serafina Mitchell, MD;  Location: [place];  Service: Vascular;  Laterality: Right;  . 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 Tee without cardioversion N/A 07/21/2013    Procedure: TRANSESOPHAGEAL ECHOCARDIOGRAM (TEE);  Surgeon: Dorothy Spark, MD;  Location: [place];  Service: Cardiovascular;  Laterality: N/A;  . Av fistula placement Right 09/08/2013    Procedure: CREATION OF RIGHT BRACHIAL CEPHALIC ARTERIOVENOUS FISTULA ;  Surgeon: Mal Misty, MD;  Location: [place];  Service: Vascular;  Laterality: Right;  . Bascilic vein transposition Right 01/26/2014    Procedure: Right Arm BASILIC VEIN TRANSPOSITION;  Surgeon: Mal Misty, MD;  Location: [place];  Service: Vascular;  Laterality: Right;  . Ligation of arteriovenous  fistula Right 01/26/2014    Procedure: LIGATION OF ARTERIOVENOUS  FISTULA;  Surgeon: Mal Misty, MD;  Location: [facility] OR;  Service: Vascular;  Laterality: Right;    Subjective Symptoms/Limitations Symptoms: No paiin just a little soreness noted today.  No numbness and tingling. No schanges in bathroom habits. No head aches Pertinent History: Patient was having sharp pain for about a month and recieved a shot a month ago, following which she has had little to no pain in Rt low back superior hip.  Limitations: Standing;Walking How long can you stand comfortably?: needs walker How long can you walk comfortably?: 13minutes with walker due to weakness.  Patient Stated Goals: To get stronger and decrease remainign pain.  Pain Assessment Currently in Pain?: Yes Pain Score: 1  Pain Location: Back Pain Orientation: Right Pain Type: Chronic pain Pain Relieving Factors: shot for pain Effect of Pain on Daily Activities: bending over to the Lt   Precautions/Restrictions  Precautions Precaution Comments: dialysis - Monday Wednesday Friday  Cognition/Observation Cognition Orientation Level: Oriented X4  Sensation/Coordination/Flexibility/Functional Tests Functional Tests Functional Tests: SI alignment: Rt hip posteriorly tilted and elvated > than Lt.   Assessment RLE Strength RLE Overall Strength Comments: strenngth equal on both sides Right Hip Flexion: 5/5 Right Hip Extension: 2+/5 Right Hip ABduction: 2+/5 Right Knee  Flexion: 4/5 Right Knee Extension: 5/5 Right Ankle Dorsiflexion: 5/5 Lumbar AROM Lumbar Flexion: WNL Lumbar Extension: WNL Lumbar - Right Side Bend: WNL Lumbar - Left Side Bend: 30% limited Lumbar - Right Rotation: 35 degrees Lumbar - Left Rotation: 35 degrees Lumbar Strength Lumbar Flexion: 3+/5 Lumbar Extension: 3/5  Exercise/Treatments Bridges 10x Clams 10x Bent knee raise 10x  Physical Therapy Assessment and Plan PT Assessment and Plan Clinical Impression Statement: Patient displays low back pain secondary to hip malalignment attributed to limited Rt oblique moscl e mobility and, bilateral hip weakeness in glut med/max with Rt side weaker than Lt. Other contributing factors including, limited hip extension, limited hip internal rotaion and abdomincal muasle weakness with Lt weaker than Rt. Patietn will benefit from skille dphsyical therapy to address the above listed factors inorder to return to her regular walking program.  Rehab Potential: Good PT Frequency: Min 2X/week PT Duration: 8 weeks PT Treatment/Interventions: Gait training;Stair training;Therapeutic exercise;Balance training;Therapeutic activities;Manual techniques;Patient/family education;Functional mobility training;Neuromuscular re-education;Modalities PT Plan: Initial focus on increasing abdominal and lumbar paraspinal muscle strengthand Rt obliques muscle mobility, along with strengthening of bilateral glut med/max strength. As mobility and strength improved focus to shift to improving ability to lift heavy objects from the floor.     Goals PT Short Term Goals PT Short Term Goal 1: Patient will demosntrate increased abdominal muscle strength to a 4/5 MMT to be able to do a sit up with arms over chest PT Short Term Goal 1 - Progress: Progressing toward goal PT Short Term Goal 2: Patient will demsontrate increased trunk rotation bilaterally to WNL PT Short Term Goal 3: Patient will dmeosntrate increased glut med/max  strength to 3/5 MMT so be bale to stand up right PT Long Term Goals PT Long Term Goal 1: Patient will demonstrate increased abdominal strength to 5/5MT to be able to sit up out of bed withtou difficulty PT Long Term Goal 2: Patient will dmeosntrate increased glut med/max strength to 4/5  MMT toimprove hip stability so that pelvis remains in alignment for >1 week.   Problem List Patient Active Problem List   Diagnosis Date Noted  . Encounter for adequacy testing for hemodialysis 01/17/2014  . Mechanical complication of other vascular device, implant, and graft 01/17/2014  . Disturbances of vision, late effect of stroke 11/03/2013  . Right hemiparesis 08/19/2013  . CVA (cerebral infarction) 07/20/2013  . Gastroparesis due to DM 07/01/2013  . End stage renal disease 06/25/2013  . Anemia 01/26/2013  . DM W/NEURO MNFST, TYPE II, UNCONTROLLED 11/19/2006  . LEUKOCYTOSIS 11/19/2006  . DEPENDENT EDEMA, LEGS, BILATERAL 11/19/2006  . SINUS TACHYCARDIA 10/16/2006  . DIABETES MELLITUS, TYPE II 05/20/2006  . HYPERLIPIDEMIA 05/20/2006  . SYNDROME, RESTLESS LEGS 05/20/2006  . PERIPHERAL NEUROPATHY 05/20/2006  . HYPERTENSION 05/20/2006   PT - End of Session Activity Tolerance: Patient tolerated treatment well General Behavior During Therapy: WFL for tasks assessed/performed  GP   Functional Assessment Tool Used: FOTO 41% limited Functional Limitation: Changing and maintaining body position Mobility: Walking and Moving Around Current Status [id number]): At least 40 percent but less than 60 percent impaired, limited or restricted Mobility: Walking  and Moving Around Goal Status [phone]): At least 20 percent but less than 40 percent impaired, limited or restricted  Leia Alf 02/14/2014, 1:14 PM  Physician Documentation Your signature is required to indicate approval of the treatment plan as stated above.  Please sign and either send electronically or make a copy of this report for your files and  return this physician signed original.   Please mark one 1.__approve of plan  2. ___approve of plan with the following conditions.   ______________________________                                                          _____________________ Physician Signature                                                                                                             Date

## 2014-02-16 ENCOUNTER — Ambulatory Visit (HOSPITAL_COMMUNITY)
Admission: RE | Admit: 2014-02-16 | Discharge: 2014-02-16 | Disposition: A | Discharge status: Home / Self Care | Payer: Medicare Other | Source: Ambulatory Visit | Attending: Orthopaedic Surgery | Admitting: Orthopaedic Surgery

## 2014-02-16 DIAGNOSIS — IMO0001 Reserved for inherently not codable concepts without codable children: Secondary | ICD-10-CM | POA: Diagnosis not present

## 2014-02-16 NOTE — Progress Notes (Signed)
Physical Therapy Treatment Patient Details  Name: SHAMBHAVI SHERRON MRN: [id number] Date of Birth: February 24, 1941  Today's Date: 02/16/2014 Time: 1015-1110 PT Time Calculation (min): 55 min Charge: TE 1030-1110  Visit#: 2 of 16  Re-eval: 03/16/14 Assessment Diagnosis: Rt Low back pain secondary to stiffness in pelvis and generalized weakness Next MD Visit: Dr. [name]. Refferal from [place]: Medicare  Authorization Time Period: Gcode complete on 9th visit  Authorization Visit#: 2 of 16   Subjective: Symptoms/Limitations Symptoms: Pain very minimal following shot about a month ago.   Pain Assessment Currently in Pain?: Yes Pain Score: 1  Pain Location: Back Pain Orientation: Lower;Right  Precautions/Restrictions  Precautions Precaution Comments: dialysis - Monday Wednesday Friday  Exercise/Treatments Seated Other Seated Lumbar Exercises: Thoracic excursion Lt UE movements, none with Rt UE Supine Clam: 10 reps;5 seconds Bent Knee Raise: 10 reps;5 seconds Bridge: 10 reps Straight Leg Raise: 10 reps Large Ball Abdominal Isometric: 5 reps;3 seconds Large Ball Oblique Isometric: 5 reps;3 seconds    Physical Therapy Assessment and Plan PT Assessment and Plan Clinical Impression Statement: FOTO complete at beginning of session with overall status at 59%, 41% limitatins.  Therapy session focus on abdominal strengthening and overall thoracic mobility to improve posture.  Minimal movements with Rt arm due to incision on anterior medial surface of arm.  Therapist facilitation for proper form and technique with all exercises PT Plan: Initial focus on increasing abdominal and lumbar paraspinal muscle strengthand Rt obliques muscle mobility, along with strengthening of bilateral glut med/max strength. As mobility and strength improved focus to shift to improving ability to lift heavy objects from the floor.     Goals PT Short Term Goals PT Short Term Goal 1:  Patient will demosntrate increased abdominal muscle strength to a 4/5 MMT to be able to do a sit up with arms over chest PT Short Term Goal 1 - Progress: Progressing toward goal PT Short Term Goal 2: Patient will demsontrate increased trunk rotation bilaterally to WNL PT Short Term Goal 2 - Progress: Progressing toward goal PT Short Term Goal 3: Patient will dmeosntrate increased glut med/max strength to 3/5 MMT so be bale to stand up right PT Short Term Goal 3 - Progress: Progressing toward goal PT Long Term Goals PT Long Term Goal 1: Patient will demonstrate increased abdominal strength to 5/5MT to be able to sit up out of bed withtou difficulty PT Long Term Goal 2: Patient will dmeosntrate increased glut med/max strength to 4/5  MMT toimprove hip stability so that pelvis remains in alignment for >1 week.  Long Term Goal 3: Patient able to ambulate up a flight of stairs without handrail assitance Long Term Goal 4: Improve Berg balance score to 52 inmdicating improved balance and decreased risk of falls  Problem List Patient Active Problem List   Diagnosis Date Noted  . Encounter for adequacy testing for hemodialysis 01/17/2014  . Mechanical complication of other vascular device, implant, and graft 01/17/2014  . Disturbances of vision, late effect of stroke 11/03/2013  . Right hemiparesis 08/19/2013  . CVA (cerebral infarction) 07/20/2013  . Gastroparesis due to DM 07/01/2013  . End stage renal disease 06/25/2013  . Anemia 01/26/2013  . DM W/NEURO MNFST, TYPE II, UNCONTROLLED 11/19/2006  . LEUKOCYTOSIS 11/19/2006  . DEPENDENT EDEMA, LEGS, BILATERAL 11/19/2006  . SINUS TACHYCARDIA 10/16/2006  . DIABETES MELLITUS, TYPE II 05/20/2006  . HYPERLIPIDEMIA 05/20/2006  . SYNDROME, RESTLESS LEGS 05/20/2006  . PERIPHERAL NEUROPATHY 05/20/2006  .  HYPERTENSION 05/20/2006    PT - End of Session Activity Tolerance: Patient tolerated treatment well General Behavior During Therapy: [facility] for tasks  assessed/performed  GP    Aldona Lento 02/16/2014, 1:01 PM

## 2014-02-21 ENCOUNTER — Ambulatory Visit (HOSPITAL_COMMUNITY)
Admission: RE | Admit: 2014-02-21 | Discharge: 2014-02-21 | Disposition: A | Discharge status: Home / Self Care | Payer: Medicare Other | Source: Ambulatory Visit | Attending: Orthopaedic Surgery | Admitting: Orthopaedic Surgery

## 2014-02-21 DIAGNOSIS — IMO0001 Reserved for inherently not codable concepts without codable children: Secondary | ICD-10-CM | POA: Diagnosis not present

## 2014-02-21 NOTE — Progress Notes (Signed)
Physical Therapy Treatment Patient Details  Name: [name] MRN: [id number] Date of Birth: 1940-11-27  Today's Date: 02/21/2014 Time: 1016-1055 PT Time Calculation (min): 39 min Charge: TE [id number]  Visit#: 3 of 16  Re-eval: 03/16/14 Assessment Diagnosis: Rt Low back pain secondary to stiffness in pelvis and generalized weakness Next MD Visit: Dr. [name]. Refferal from [place]: Medicare  Authorization Time Period:    Authorization Visit#: 3 of 16   Subjective: Symptoms/Limitations Symptoms: Pain minimal today, scale 1/10 Pain Assessment Currently in Pain?: Yes Pain Score: 1  Pain Location: Back Pain Orientation: Lower;Right  Precautions/Restrictions  Precautions Precaution Comments: dialysis - Monday Wednesday Friday  Exercise/Treatments Stretches  Begin hip flexion stretch next session Standing Other Standing Lumbar Exercises: 3D hip excursion Seated Other Seated Lumbar Exercises: Thoracic excursion Lt UE movements, none with Rt UE Other Seated Lumbar Exercises: 3D cervical excurson inside //bars; Cervical retraction, shoulder rolls backwards, wback therapist facilitation Supine Bent Knee Raise: 10 reps;5 seconds Bridge: 10 reps Large Ball Abdominal Isometric: 5 reps;3 seconds Large Ball Oblique Isometric: 5 reps;3 seconds     Physical Therapy Assessment and Plan PT Assessment and Plan Clinical Impression Statement: Session focus on improving overall cervical, thoracic and hip mobility with therapist facilitation to reduce stiffness and improve posture with multimodal cueing.  Pt educated on importance of proper posture, added cervical retraction and scaoplar retraction exercises with manual assistance required.  Pt imited by fatigue at end of session, no reports of pain through session.   PT Plan: Initial focus on increasing abdominal and lumbar paraspinal muscle strengthand Rt obliques muscle mobility, along with strengthening of  bilateral glut med/max strength. As mobility and strength improved focus to shift to improving ability to lift heavy objects from the floor.     Goals PT Short Term Goals PT Short Term Goal 1: Patient will demosntrate increased abdominal muscle strength to a 4/5 MMT to be able to do a sit up with arms over chest PT Short Term Goal 1 - Progress: Progressing toward goal PT Short Term Goal 2: Patient will demsontrate increased trunk rotation bilaterally to WNL PT Short Term Goal 2 - Progress: Progressing toward goal PT Short Term Goal 3: Patient will dmeosntrate increased glut med/max strength to 3/5 MMT so be bale to stand up right PT Short Term Goal 3 - Progress: Progressing toward goal PT Long Term Goals PT Long Term Goal 1: Patient will demonstrate increased abdominal strength to 5/5MT to be able to sit up out of bed withtou difficulty PT Long Term Goal 2: Patient will demonstrate increased glut med/max strength to 4/5  MMT toimprove hip stability so that pelvis remains in alignment for >1 week.  PT Long Term Goal 2 - Progress: Progressing toward goal Long Term Goal 3: Patient able to ambulate up a flight of stairs without handrail assitance Long Term Goal 4: Improve Berg balance score to 52 inmdicating improved balance and decreased risk of falls  Problem List Patient Active Problem List   Diagnosis Date Noted  . Encounter for adequacy testing for hemodialysis 01/17/2014  . Mechanical complication of other vascular device, implant, and graft 01/17/2014  . Disturbances of vision, late effect of stroke 11/03/2013  . Right hemiparesis 08/19/2013  . CVA (cerebral infarction) 07/20/2013  . Gastroparesis due to DM 07/01/2013  . End stage renal disease 06/25/2013  . Anemia 01/26/2013  . DM W/NEURO MNFST, TYPE II, UNCONTROLLED 11/19/2006  . LEUKOCYTOSIS 11/19/2006  . DEPENDENT EDEMA,  LEGS, BILATERAL 11/19/2006  . SINUS TACHYCARDIA 10/16/2006  . DIABETES MELLITUS, TYPE II 05/20/2006  .  HYPERLIPIDEMIA 05/20/2006  . SYNDROME, RESTLESS LEGS 05/20/2006  . PERIPHERAL NEUROPATHY 05/20/2006  . HYPERTENSION 05/20/2006    PT - End of Session Activity Tolerance: Patient tolerated treatment well General Behavior During Therapy: [facility] for tasks assessed/performed  GP    Aldona Lento 02/21/2014, 11:05 AM

## 2014-02-23 ENCOUNTER — Ambulatory Visit (HOSPITAL_COMMUNITY)
Admission: RE | Admit: 2014-02-23 | Discharge: 2014-02-23 | Disposition: A | Discharge status: Home / Self Care | Payer: Medicare Other | Source: Ambulatory Visit | Attending: Orthopaedic Surgery | Admitting: Orthopaedic Surgery

## 2014-02-23 DIAGNOSIS — IMO0001 Reserved for inherently not codable concepts without codable children: Secondary | ICD-10-CM | POA: Diagnosis not present

## 2014-02-23 NOTE — Progress Notes (Signed)
Physical Therapy Treatment Patient Details  Name: [name] MRN: [id number] Date of Birth: 04/25/1941  Today's Date: 02/23/2014 Time: 1105-1150 PT Time Calculation (min): 45 min  Charges: TherEx 1105-1150 Visit#: 4 of 16  Re-eval: 03/16/14 Assessment Diagnosis: Rt Low back pain secondary to stiffness in pelvis and generalized weakness Next MD Visit: Dr. [name]. Refferal from [place]: Medicare  Authorization Visit#: 4 of 16   Subjective: Symptoms/Limitations Symptoms: Patient states she is doing well today, no pain.   Exercise/Treatments Mobility/Balance  Nustep Lvl 3 23minutes Stretches Hip Flexor Stretch: 4 reps;20 seconds Piriformis Stretch: 4 reps;20 seconds Standing Functional Squats: 10 reps;Limitations Functional Squats Limitations: UE support Other Standing Lumbar Exercises: 3D hip excursion Seated Other Seated Lumbar Exercises: Thoracic excursion with arms across chest Supine Clam: 10 reps;5 seconds Bent Knee Raise: 10 reps;5 seconds Bridge: 20 reps Quadruped Straight Leg Raise: 10 reps  Physical Therapy Assessment and Plan PT Assessment and Plan Clinical Impression Statement: Session continued focus on improving LE mobility and lumbar and thoracic spine mobility. Patient noted no pain throughtou session and displays improved ability to stand up right without cuing as well as improved ability to sit with ankle over knee. Introduced quadraped straight leg raise this session noting patient unable to lift leg through full ROM.  PT Plan: Continue focus on increasing abdominal and lumbar paraspinal muscle strength and Rt obliques muscle mobility, along with strengthening of bilateral glut med/max strength.    Goals PT Short Term Goals PT Short Term Goal 1: Patient will demosntrate increased abdominal muscle strength to a 4/5 MMT to be able to do a sit up with arms over chest PT Short Term Goal 1 - Progress: Progressing toward goal PT  Short Term Goal 2: Patient will demsontrate increased trunk rotation bilaterally to WNL PT Short Term Goal 2 - Progress: Progressing toward goal PT Short Term Goal 3: Patient will dmeosntrate increased glut med/max strength to 3/5 MMT so be bale to stand up right PT Short Term Goal 3 - Progress: Progressing toward goal  Problem List Patient Active Problem List   Diagnosis Date Noted  . Encounter for adequacy testing for hemodialysis 01/17/2014  . Mechanical complication of other vascular device, implant, and graft 01/17/2014  . Disturbances of vision, late effect of stroke 11/03/2013  . Right hemiparesis 08/19/2013  . CVA (cerebral infarction) 07/20/2013  . Gastroparesis due to DM 07/01/2013  . End stage renal disease 06/25/2013  . Anemia 01/26/2013  . DM W/NEURO MNFST, TYPE II, UNCONTROLLED 11/19/2006  . LEUKOCYTOSIS 11/19/2006  . DEPENDENT EDEMA, LEGS, BILATERAL 11/19/2006  . SINUS TACHYCARDIA 10/16/2006  . DIABETES MELLITUS, TYPE II 05/20/2006  . HYPERLIPIDEMIA 05/20/2006  . SYNDROME, RESTLESS LEGS 05/20/2006  . PERIPHERAL NEUROPATHY 05/20/2006  . HYPERTENSION 05/20/2006    PT - End of Session Activity Tolerance: Patient tolerated treatment well General Behavior During Therapy: [facility] for tasks assessed/performed  GP    Domenique Southers R 02/23/2014, 12:29 PM

## 2014-02-24 LAB — MDC_IDC_ENUM_SESS_TYPE_REMOTE

## 2014-02-26 LAB — MDC_IDC_ENUM_SESS_TYPE_REMOTE

## 2014-02-28 ENCOUNTER — Ambulatory Visit (HOSPITAL_COMMUNITY)
Admission: RE | Admit: 2014-02-28 | Discharge: 2014-02-28 | Disposition: A | Discharge status: Home / Self Care | Payer: Medicare Other | Source: Ambulatory Visit | Attending: Orthopaedic Surgery | Admitting: Orthopaedic Surgery

## 2014-02-28 DIAGNOSIS — IMO0001 Reserved for inherently not codable concepts without codable children: Secondary | ICD-10-CM | POA: Diagnosis not present

## 2014-02-28 NOTE — Progress Notes (Signed)
Physical Therapy Treatment Patient Details  Name: [name] MRN: [id number] Date of Birth: 01/19/1941  Today's Date: 02/28/2014 Time: 0935-1020 PT Time Calculation (min): 45 min  Visit#: 5 of 16  Re-eval: 03/16/14 Authorization: Medicare  Authorization Visit#: 5 of 10  Charges:  therex 45  Subjective: Symptoms/Limitations Symptoms: PT states she is not hurting today.  Reports compliance with HEP Pain Assessment Currently in Pain?: No/denies   Exercise/Treatments Stretches Active Hamstring Stretch: 3 reps;30 seconds;Limitations Active Hamstring Stretch Limitations: 14" box bilaterally Hip Flexor Stretch: 3 reps;30 seconds;Limitations Hip Flexor Stretch Limitations: standing with 14" box bilaterally Piriformis Stretch: 1 rep;60 seconds;Limitations Piriformis Stretch Limitations: seated bilaterally Aerobic Stationary Bike: nustep 8 minutes,  seat 8, LE only level 3 hills #3 Standing Functional Squats: 10 reps;Limitations Functional Squats Limitations: UE support Seated Other Seated Lumbar Exercises: Thoracic excursion with arms across chest Supine Clam: 10 reps;5 seconds Bent Knee Raise: 10 reps Bridge: 20 reps       Physical Therapy Assessment and Plan PT Assessment and Plan Clinical Impression Statement: Continued focus on improving LE strength and spinal mobility.  Pt requires constant therapist facilitation to complete exercises in correct form and with lumbar stability.  Added standing hamstring and hip flexor stetches with noted tightness PT Plan: Continue focus on increasing abdominal and lumbar paraspinal muscle strength and Rt obliques muscle mobility, along with strengthening of bilateral glut med/max strength.  Add SLS and resume quadriped SLR next session.     Problem List Patient Active Problem List   Diagnosis Date Noted  . Encounter for adequacy testing for hemodialysis 01/17/2014  . Mechanical complication of other vascular device, implant,  and graft 01/17/2014  . Disturbances of vision, late effect of stroke 11/03/2013  . Right hemiparesis 08/19/2013  . CVA (cerebral infarction) 07/20/2013  . Gastroparesis due to DM 07/01/2013  . End stage renal disease 06/25/2013  . Anemia 01/26/2013  . DM W/NEURO MNFST, TYPE II, UNCONTROLLED 11/19/2006  . LEUKOCYTOSIS 11/19/2006  . DEPENDENT EDEMA, LEGS, BILATERAL 11/19/2006  . SINUS TACHYCARDIA 10/16/2006  . DIABETES MELLITUS, TYPE II 05/20/2006  . HYPERLIPIDEMIA 05/20/2006  . SYNDROME, RESTLESS LEGS 05/20/2006  . PERIPHERAL NEUROPATHY 05/20/2006  . HYPERTENSION 05/20/2006    PT - End of Session Activity Tolerance: Patient tolerated treatment well General Behavior During Therapy: [facility] for tasks assessed/performed  GP    Teena Irani, PTA/CLT 02/28/2014, 10:29 AM

## 2014-03-02 ENCOUNTER — Ambulatory Visit (HOSPITAL_COMMUNITY)
Admission: RE | Admit: 2014-03-02 | Discharge: 2014-03-02 | Disposition: A | Discharge status: Home / Self Care | Payer: Medicare Other | Source: Ambulatory Visit | Attending: Family Medicine | Admitting: Family Medicine

## 2014-03-02 DIAGNOSIS — IMO0001 Reserved for inherently not codable concepts without codable children: Secondary | ICD-10-CM | POA: Diagnosis not present

## 2014-03-02 NOTE — Progress Notes (Signed)
Physical Therapy Treatment Patient Details  Name: [name] MRN: [id number] Date of Birth: 09/23/1940  Today's Date: 03/02/2014 Time: 0932-1012 PT Time Calculation (min): 40 min TE [id number]  Visit#: 6 of 16  Re-eval: 03/16/14 Assessment Diagnosis: Rt Low back pain secondary to stiffness in pelvis and generalized weakness Next MD Visit: Dr. [name]. Refferal from [facility]    Subjective: Symptoms/Limitations Symptoms: No complaint of pain in the low back today, "feeling pretty good".  Pt reports some stiffness in her neck though.  Pain Assessment Currently in Pain?: No/denies   Exercise/Treatments Stretches Active Hamstring Stretch: 3 reps;30 seconds;Limitations Active Hamstring Stretch Limitations: 14" box bilaterally Quad Stretch: 2 reps;20 seconds;Limitations Quad Stretch Limitations: Manual prone stretch Piriformis Stretch: 2 reps;30 seconds Piriformis Stretch Limitations: seated bilaterally Standing Other Standing Lumbar Exercises: 3D hip excursion Other Standing Lumbar Exercises: Press photographer, Slantboard 30" x3 Supine Bridge: 3 seconds;15 reps;Limitations Bridge Limitations: Arms Crossed Straight Leg Raise: 20 reps Prone  Straight Leg Raise: 20 reps  Balance Exercises Standing Tandem Stance: Eyes open;Limitations Tandem Stance Limitations: Lt 10", 15", 7" (avg. 10.6")    Rt 6", 8", 6" (6.6") Other Standing Exercises: Sit <-> Stand, no UE x5 (min guard -> min assist)   Physical Therapy Assessment and Plan PT Assessment and Plan Clinical Impression Statement: Assessed quad length in supine, with noted tightness on Lt > Rt side; added manual quad stretch to lengthen muscle.  Attempted SLS for balance exericse, though pt unable to maintain SLS on Rt or Lt LE for 1 second.  Transitioned to tandem balance, with balance greater on Lt more than Rt, though neither side to normal.  Pt will benefit from skilled therapeutic intervention in order to improve  on the following deficits: Decreased activity tolerance;Decreased balance;Decreased coordination;Difficulty walking;Decreased strength;Decreased range of motion;Decreased mobility;Impaired flexibility;Pain PT Plan: Continue focus on increasing abdominal and lumbar paraspinal muscle strength and Rt obliques muscle mobility, along with strengthening of bilateral glut med/max strength.      Goals PT Short Term Goals PT Short Term Goal 1: Patient will demosntrate increased abdominal muscle strength to a 4/5 MMT to be able to do a sit up with arms over chest PT Short Term Goal 1 - Progress: Progressing toward goal PT Short Term Goal 2: Patient will demsontrate increased trunk rotation bilaterally to WNL PT Short Term Goal 2 - Progress: Progressing toward goal PT Short Term Goal 3: Patient will dmeosntrate increased glut med/max strength to 3/5 MMT so be bale to stand up right PT Short Term Goal 3 - Progress: Progressing toward goal PT Long Term Goals PT Long Term Goal 1: Patient will demonstrate increased abdominal strength to 5/5MT to be able to sit up out of bed withtou difficulty PT Long Term Goal 1 - Progress: Progressing toward goal PT Long Term Goal 2: Patient will demonstrate increased glut med/max strength to 4/5  MMT toimprove hip stability so that pelvis remains in alignment for >1 week.  PT Long Term Goal 2 - Progress: Progressing toward goal  Problem List Patient Active Problem List   Diagnosis Date Noted  . Encounter for adequacy testing for hemodialysis 01/17/2014  . Mechanical complication of other vascular device, implant, and graft 01/17/2014  . Disturbances of vision, late effect of stroke 11/03/2013  . Right hemiparesis 08/19/2013  . CVA (cerebral infarction) 07/20/2013  . Gastroparesis due to DM 07/01/2013  . End stage renal disease 06/25/2013  . Anemia 01/26/2013  . DM W/NEURO MNFST, TYPE II, UNCONTROLLED 11/19/2006  .  LEUKOCYTOSIS 11/19/2006  . DEPENDENT EDEMA, LEGS,  BILATERAL 11/19/2006  . SINUS TACHYCARDIA 10/16/2006  . DIABETES MELLITUS, TYPE II 05/20/2006  . HYPERLIPIDEMIA 05/20/2006  . SYNDROME, RESTLESS LEGS 05/20/2006  . PERIPHERAL NEUROPATHY 05/20/2006  . HYPERTENSION 05/20/2006    PT - End of Session Activity Tolerance: Patient tolerated treatment well General Behavior During Therapy: [facility] for tasks assessed/performed   [name] 03/02/2014, 10:14 AM

## 2014-03-07 ENCOUNTER — Ambulatory Visit (HOSPITAL_COMMUNITY)
Admission: RE | Admit: 2014-03-07 | Discharge: 2014-03-07 | Disposition: A | Discharge status: Home / Self Care | Payer: Medicare Other | Source: Ambulatory Visit | Attending: Orthopaedic Surgery | Admitting: Orthopaedic Surgery

## 2014-03-07 NOTE — Progress Notes (Signed)
Physical Therapy Treatment Patient Details  Name: [name] MRN: [id number] Date of Birth: 11/12/1940  Today's Date: 03/07/2014 Time: 1020-1045 PT Time Calculation (min): 25 min Charge: no charge (see note)  Visit#: 7 of 16  Re-eval: 03/16/14 Assessment Diagnosis: Rt Low back pain secondary to stiffness in pelvis and generalized weakness Next MD Visit: Dr. [name]. Refferal from [place]: Medicare  Authorization Time Period: Gcode complete on 9th visit  Authorization Visit#: 7 of 10   Subjective: Symptoms/Limitations Symptoms: No reports of pain today, a little soreness with Lt lower back today.  Pt stated she felt nauseous today. Pain Assessment Currently in Pain?: No/denies  Precautions/Restrictions     Exercise/Treatments Standing Other Standing Lumbar Exercises: 3D hip excursion      Physical Therapy Assessment and Plan PT Assessment and Plan Clinical Impression Statement: Began session with 3D hip excursion with therapist facilitation to improve form and techniuqe, following 5 reps pt stated she felt nauseous.  Session ended due to sickness, pt given gingerale and crackers to help stomach.   PT Plan: Continue focus on increasing abdominal and lumbar paraspinal muscle strength and Rt obliques muscle mobility, along with strengthening of bilateral glut med/max strength.      Goals PT Short Term Goals PT Short Term Goal 1: Patient will demosntrate increased abdominal muscle strength to a 4/5 MMT to be able to do a sit up with arms over chest PT Short Term Goal 2: Patient will demsontrate increased trunk rotation bilaterally to WNL PT Short Term Goal 3: Patient will dmeosntrate increased glut med/max strength to 3/5 MMT so be bale to stand up right PT Long Term Goals PT Long Term Goal 1: Patient will demonstrate increased abdominal strength to 5/5MT to be able to sit up out of bed withtou difficulty PT Long Term Goal 2: Patient will  demonstrate increased glut med/max strength to 4/5  MMT toimprove hip stability so that pelvis remains in alignment for >1 week.  Long Term Goal 3: Patient able to ambulate up a flight of stairs without handrail assitance Long Term Goal 4: Improve Berg balance score to 52 inmdicating improved balance and decreased risk of falls  Problem List Patient Active Problem List   Diagnosis Date Noted  . Encounter for adequacy testing for hemodialysis 01/17/2014  . Mechanical complication of other vascular device, implant, and graft 01/17/2014  . Disturbances of vision, late effect of stroke 11/03/2013  . Right hemiparesis 08/19/2013  . CVA (cerebral infarction) 07/20/2013  . Gastroparesis due to DM 07/01/2013  . End stage renal disease 06/25/2013  . Anemia 01/26/2013  . DM W/NEURO MNFST, TYPE II, UNCONTROLLED 11/19/2006  . LEUKOCYTOSIS 11/19/2006  . DEPENDENT EDEMA, LEGS, BILATERAL 11/19/2006  . SINUS TACHYCARDIA 10/16/2006  . DIABETES MELLITUS, TYPE II 05/20/2006  . HYPERLIPIDEMIA 05/20/2006  . SYNDROME, RESTLESS LEGS 05/20/2006  . PERIPHERAL NEUROPATHY 05/20/2006  . HYPERTENSION 05/20/2006    PT - End of Session Activity Tolerance: Patient tolerated treatment well;Other (comment) (nauseous) General Behavior During Therapy: [facility] for tasks assessed/performed  GP    Aldona Lento 03/07/2014, 10:52 AM

## 2014-03-08 ENCOUNTER — Telehealth: Payer: Self-pay | Admitting: Cardiology

## 2014-03-08 NOTE — Telephone Encounter (Signed)
Spoke with pt daughter and informed her that pt monitor is disconnected and that this happens when monitor has became unplugged or a power outage. She stated that she would send transmission later today.

## 2014-03-09 ENCOUNTER — Encounter: Payer: Self-pay | Admitting: Internal Medicine

## 2014-03-09 ENCOUNTER — Ambulatory Visit (HOSPITAL_COMMUNITY)
Admission: RE | Admit: 2014-03-09 | Discharge: 2014-03-09 | Disposition: A | Discharge status: Home / Self Care | Payer: Medicare Other | Source: Ambulatory Visit | Attending: Orthopaedic Surgery | Admitting: Orthopaedic Surgery

## 2014-03-09 DIAGNOSIS — IMO0001 Reserved for inherently not codable concepts without codable children: Secondary | ICD-10-CM | POA: Diagnosis not present

## 2014-03-09 NOTE — Progress Notes (Signed)
Physical Therapy Treatment Patient Details  Name: [name] MRN: [id number] Date of Birth: 01-27-1941  Today's Date: 03/09/2014 Time: 1007-1050 PT Time Calculation (min): 43 min Visit#: 8 of 16  Re-eval: 03/16/14 Authorization: Medicare  Authorization Visit#: 8 of 10  Charges:  therex 40  Subjective: Symptoms/Limitations Symptoms: Pain free today, continues compliance with HEP daily.  Feeling a lot better than last session. Pain Assessment Currently in Pain?: No/denies   Exercise/Treatments Standing Forward Lunges: Limitations Forward Lunges Limitations: 8" box Rt LE Forward Step Up: Step Height: 8";10 reps Functional Squat: 10 reps;Limitations Functional Squat Limitations: 3way with hand rail assist Stairs: 5 flights up and down reciprocol on 7" side with 1 HR  Gait Training: lunge walks, high knee march, ( high heel walk,, long steps, fast walking not completed today) 58ft each Other Standing Knee Exercises: Single leg balance on airex alternating 30" X 2 Seated Other Seated Knee Exercises: sit to stand 10 times from standard chair with airex without UE's; min A     Physical Therapy Assessment and Plan PT Assessment and Plan Clinical Impression Statement: Progressed to 30" SLS holds on airex today.  Pt able to maintain balance briefly, approx 2" max until needing use of UE's.  Pt continues to require frequent rest breaks due to fatigue and dyspnea.  Noted improvement in LE stregnth with balance actvities.   No nausea reported today. PT Plan: Continue focus on increasing abdominal and lumbar paraspinal muscle strength and Rt obliques muscle mobility, along with strengthening of bilateral glut med/max strength.       Problem List Patient Active Problem List   Diagnosis Date Noted  . Encounter for adequacy testing for hemodialysis 01/17/2014  . Mechanical complication of other vascular device, implant, and graft 01/17/2014  . Disturbances of vision, late effect of  stroke 11/03/2013  . Right hemiparesis 08/19/2013  . CVA (cerebral infarction) 07/20/2013  . Gastroparesis due to DM 07/01/2013  . End stage renal disease 06/25/2013  . Anemia 01/26/2013  . DM W/NEURO MNFST, TYPE II, UNCONTROLLED 11/19/2006  . LEUKOCYTOSIS 11/19/2006  . DEPENDENT EDEMA, LEGS, BILATERAL 11/19/2006  . SINUS TACHYCARDIA 10/16/2006  . DIABETES MELLITUS, TYPE II 05/20/2006  . HYPERLIPIDEMIA 05/20/2006  . SYNDROME, RESTLESS LEGS 05/20/2006  . PERIPHERAL NEUROPATHY 05/20/2006  . HYPERTENSION 05/20/2006    PT - End of Session Activity Tolerance: Patient tolerated treatment well;Other (comment) (nauseous) General Behavior During Therapy: WFL for tasks assessed/performed   Teena Irani, PTA/CLT 03/09/2014, 10:54 AM

## 2014-03-10 ENCOUNTER — Encounter: Payer: Self-pay | Admitting: Internal Medicine

## 2014-03-14 ENCOUNTER — Ambulatory Visit (HOSPITAL_COMMUNITY)
Admission: RE | Admit: 2014-03-14 | Discharge: 2014-03-14 | Disposition: A | Discharge status: Home / Self Care | Payer: Medicare Other | Source: Ambulatory Visit | Attending: Family Medicine | Admitting: Family Medicine

## 2014-03-14 ENCOUNTER — Encounter: Payer: Medicare Other | Admitting: Vascular Surgery

## 2014-03-14 DIAGNOSIS — IMO0001 Reserved for inherently not codable concepts without codable children: Secondary | ICD-10-CM | POA: Diagnosis not present

## 2014-03-14 NOTE — Evaluation (Signed)
Physical Therapy Evaluation  Patient Details  Name: [name] MRN: [id number] Date of Birth: 07/02/1940  Today's Date: 03/14/2014 Time: 1011-1056 PT Time Calculation (min): 45 min    Charges: 1 MMT, 1 ROMM, TE 1020-1056          Visit#: 9 of 16  Re-eval: 04/13/14 Assessment Diagnosis: Rt Low back pain secondary to stiffness in pelvis and generalized weakness Next MD Visit: Dr. [name]. Refferal from [facility]: Medicare    Authorization Time Period: Gcode complete on 9th visit  Authorization Visit#: 9 of 19   Past Medical History:  Past Medical History  Diagnosis Date  . Hypertension   . Cataract   . Chronic kidney disease   . Shortness of breath   . Anemia   . CVA (cerebral infarction)   . Dialysis patient   . Diabetes mellitus     Type 2  . Stroke     right side weakness  . GERD (gastroesophageal reflux disease)    Past Surgical History:  Past Surgical History  Procedure Laterality Date  . Cataract extraction w/phaco  11/20/2011    Procedure: CATARACT EXTRACTION PHACO AND INTRAOCULAR LENS PLACEMENT (IOC);  Surgeon: Tonny Branch, MD;  Location: [facility];  Service: Ophthalmology;  Laterality: Right;  CDE 18.82  . Cataract extraction w/phaco Left 11/18/2012    Procedure: CATARACT EXTRACTION PHACO AND INTRAOCULAR LENS PLACEMENT (IOC);  Surgeon: Tonny Branch, MD;  Location: [facility];  Service: Ophthalmology;  Laterality: Left;  CDE: 18.97  . Colonoscopy N/A 02/09/2013    Procedure: COLONOSCOPY;  Surgeon: Rogene Houston, MD;  Location: [facility];  Service: Endoscopy;  Laterality: N/A;  305-moved to 220 Ann to notify pt  . Insertion of dialysis catheter Right 06/24/2013    Procedure: INSERTION OF DIALYSIS CATHETER: Ultrasound guided;  Surgeon: Serafina Mitchell, MD;  Location: [facility];  Service: Vascular;  Laterality: Right;  . 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 Tee without cardioversion N/A 07/21/2013   Procedure: TRANSESOPHAGEAL ECHOCARDIOGRAM (TEE);  Surgeon: Dorothy Spark, MD;  Location: [facility];  Service: Cardiovascular;  Laterality: N/A;  . Av fistula placement Right 09/08/2013    Procedure: CREATION OF RIGHT BRACHIAL CEPHALIC ARTERIOVENOUS FISTULA ;  Surgeon: Mal Misty, MD;  Location: [facility];  Service: Vascular;  Laterality: Right;  . Bascilic vein transposition Right 01/26/2014    Procedure: Right Arm BASILIC VEIN TRANSPOSITION;  Surgeon: Mal Misty, MD;  Location: [facility];  Service: Vascular;  Laterality: Right;  . Ligation of arteriovenous  fistula Right 01/26/2014    Procedure: LIGATION OF ARTERIOVENOUS  FISTULA;  Surgeon: Mal Misty, MD;  Location: [facility];  Service: Vascular;  Laterality: Right;    Subjective Symptoms/Limitations Symptoms: No pain Pain Assessment Currently in Pain?: No/denies  Sensation/Coordination/Flexibility/Functional Tests Functional Tests Functional Tests: Elys Test Rt 9 1/4 in, Lt 10 in (heel to glute)  Assessment RLE Strength RLE Overall Strength Comments: strength equal on both sides Right Hip Flexion: 5/5 Right Hip Extension: 2+/5 (very strong but lacks full ROM) Right Hip ABduction: 3+/5 Right Knee Flexion: 5/5 (4+/5) Right Knee Extension: 5/5 Right Ankle Dorsiflexion: 5/5 Lumbar AROM Lumbar Flexion: WNL Lumbar Extension: WNL Lumbar - Right Side Bend: WNL Lumbar - Left Side Bend: WNL Lumbar - Right Rotation: 45 degrees Lumbar - Left Rotation: 45 degrees Lumbar Strength Lumbar Flexion: 4/5 Lumbar Extension: 5/5  Exercise/Treatments Mobility/Balance  Berg Balance Test Sit to Stand: Able to stand without using hands and stabilize independently Standing Unsupported: Able to stand safely 2 minutes Sitting with Back Unsupported but Feet Supported on Floor or Stool: Able to sit safely and securely 2 minutes Stand to Sit: Sits safely with minimal use of hands Transfers: Able to transfer safely, minor use of hands Standing  Unsupported with Eyes Closed: Able to stand 10 seconds safely Standing Ubsupported with Feet Together: Able to place feet together independently and stand 1 minute safely From Standing, Reach Forward with Outstretched Arm: Can reach confidently >25 cm (10") From Standing Position, Pick up Object from Floor: Able to pick up shoe safely and easily From Standing Position, Turn to Look Behind Over each Shoulder: Looks behind from both sides and weight shifts well Turn 360 Degrees: Able to turn 360 degrees safely in 4 seconds or less Standing Unsupported, Alternately Place Feet on Step/Stool: Able to stand independently and safely and complete 8 steps in 20 seconds Standing Unsupported, One Foot in Front: Able to plae foot ahead of the other independently and hold 30 seconds Standing on One Leg: Able to lift leg independently and hold equal to or more than 3 seconds Total Score: 53   Stretches Active Hamstring Stretch: 3 reps;30 seconds;Limitations Active Hamstring Stretch Limitations: 14" box bilaterally Quad Stretch: 2 reps;20 seconds;Limitations Quad Stretch Limitations: Manual prone stretch Piriformis Stretch: 2 reps;30 seconds Piriformis Stretch Limitations: seated bilaterally Standing Functional Squats Limitations: Sqaut reach matrix 5x with 3lb dumbbell 5x each Other Standing Lumbar Exercises: 3D hip excursion Quadruped Plank: 3D UE weight shift with hip driver 56Y   Physical Therapy Assessment and Plan PT Assessment and Plan Clinical Impression Statement: Patient has met or partially met all goals and will be able to achieve remaining long term goals independently with HEP. Session focused on education of exercises to be performed as part of HEP to conitnue strengtheing hip and trunk muscles to decrease strain on low back. Patient required cuing through out session for correct performance of exercises.  PT Plan: Patient to be discharged next session follwoing completion of education of  exercises for HEP. Provide patient with hand out for squat reach matrix, 3D UE weight shifts (will need to draw), prone quad stretch, in addition to LE stretches.    Goals PT Short Term Goals PT Short Term Goal 1: Patient will demosntrate increased abdominal muscle strength to a 4/5 MMT to be able to do a sit up with arms over chest PT Short Term Goal 1 - Progress: Met PT Short Term Goal 2: Patient will demsontrate increased trunk rotation bilaterally to WNL PT Short Term Goal 2 - Progress: Met PT Short Term Goal 3: Patient will dmeosntrate increased glut med/max strength to 3/5 MMT so be bale to stand up right PT Short Term Goal 3 - Progress: Partly met PT Long Term Goals PT Long Term Goal 1: Patient will demonstrate increased abdominal strength to 5/5MT to be able to sit up out of bed withtou difficulty PT Long Term Goal 1 - Progress: Met PT Long Term Goal 2: Patient will demonstrate increased glut med/max strength to 4/5  MMT toimprove hip stability so that pelvis remains in alignment for >1 week.  PT Long Term Goal 2 - Progress: Partly met Long Term Goal 3: Patient able to ambulate up a flight of stairs without handrail assitance Long Term Goal 3 Progress: Met Long Term Goal 4: Improve Berg balance score to >52 inmdicating improved balance and  decreased risk of falls Long Term Goal 4 Progress: Met  Problem List Patient Active Problem List   Diagnosis Date Noted  . Encounter for adequacy testing for hemodialysis 01/17/2014  . Mechanical complication of other vascular device, implant, and graft 01/17/2014  . Disturbances of vision, late effect of stroke 11/03/2013  . Right hemiparesis 08/19/2013  . CVA (cerebral infarction) 07/20/2013  . Gastroparesis due to DM 07/01/2013  . End stage renal disease 06/25/2013  . Anemia 01/26/2013  . DM W/NEURO MNFST, TYPE II, UNCONTROLLED 11/19/2006  . LEUKOCYTOSIS 11/19/2006  . DEPENDENT EDEMA, LEGS, BILATERAL 11/19/2006  . SINUS TACHYCARDIA  10/16/2006  . DIABETES MELLITUS, TYPE II 05/20/2006  . HYPERLIPIDEMIA 05/20/2006  . SYNDROME, RESTLESS LEGS 05/20/2006  . PERIPHERAL NEUROPATHY 05/20/2006  . HYPERTENSION 05/20/2006    PT - End of Session Activity Tolerance: Patient tolerated treatment well;Other (comment) General Behavior During Therapy: WFL for tasks assessed/performed  GP Functional Assessment Tool Used: FOTO 2% limited was 41% limited Functional Limitation: Changing and maintaining body position Changing and Maintaining Body Position Current Status (Z9278): At least 1 percent but less than 20 percent impaired, limited or restricted Changing and Maintaining Body Position Goal Status (S0447): At least 40 percent but less than 60 percent impaired, limited or restricted  Leia Alf 03/14/2014, 11:01 AM  Physician Documentation Your signature is required to indicate approval of the treatment plan as stated above.  Please sign and either send electronically or make a copy of this report for your files and return this physician signed original.   Please mark one 1.__approve of plan  2. ___approve of plan with the following conditions.   ______________________________                                                          _____________________ Physician Signature                                                                                                             Date

## 2014-03-16 ENCOUNTER — Ambulatory Visit (HOSPITAL_COMMUNITY)
Admission: RE | Admit: 2014-03-16 | Discharge: 2014-03-16 | Disposition: A | Discharge status: Home / Self Care | Payer: Medicare Other | Source: Ambulatory Visit | Attending: Family Medicine | Admitting: Family Medicine

## 2014-03-16 DIAGNOSIS — M545 Low back pain: Secondary | ICD-10-CM | POA: Insufficient documentation

## 2014-03-16 DIAGNOSIS — Z5189 Encounter for other specified aftercare: Secondary | ICD-10-CM | POA: Diagnosis present

## 2014-03-16 DIAGNOSIS — R531 Weakness: Secondary | ICD-10-CM | POA: Insufficient documentation

## 2014-03-16 NOTE — Progress Notes (Signed)
Physical Therapy Discharge  Patient Details  Name: [name] MRN: [id number] Date of Birth: 05/31/1941  Today's Date: 03/16/2014 Time: 1115-5208 PT Time Calculation (min): 35 min              Visit#: 10 of 16  Re-eval: 04/13/14 Assessment Diagnosis: Rt Low back pain secondary to stiffness in pelvis and generalized weakness Next MD Visit: Dr. [name]. Refferal from [place]: Medicare    Authorization Time Period: Gcode complete on 9th visit  Authorization Visit#: 10 of 19  Charges:  therex [phone], patient education [phone]   Subjective Symptoms/Limitations Symptoms: Pt states she is ready to graduate.  No pain or complaints today. Pain Assessment Currently in Pain?: No/denies  Objective All objective measures takne 03/14/14 by Devona Konig, DPT Sensation/Coordination/Flexibility/Functional Tests Functional Tests Functional Tests: Elys Test Rt 9 1/4 in, Lt 10 in (heel to glute) Assessment RLE Strength RLE Overall Strength Comments: strength equal on both sides Right Hip Flexion: 5/5 Right Hip Extension: 2+/5 (very strong but lacks full ROM) Right Hip ABduction: 3+/5 Right Knee Flexion: 5/5 (4+/5) Right Knee Extension: 5/5 Right Ankle Dorsiflexion: 5/5 Lumbar AROM Lumbar Flexion: WNL Lumbar Extension: WNL Lumbar - Right Side Bend: WNL Lumbar - Left Side Bend: WNL Lumbar - Right Rotation: 45 degrees Lumbar - Left Rotation: 45 degrees Lumbar Strength Lumbar Flexion: 4/5 Lumbar Extension: 5/5  Exercise/Treatments Mobility/Balance  Berg Balance Test Sit to Stand: Able to stand without using hands and stabilize independently Standing Unsupported: Able to stand safely 2 minutes Sitting with Back Unsupported but Feet Supported on Floor or Stool: Able to sit safely and securely 2 minutes Stand to Sit: Sits safely with minimal use of hands Transfers: Able to transfer safely, minor use of hands Standing Unsupported with Eyes Closed: Able to  stand 10 seconds safely Standing Ubsupported with Feet Together: Able to place feet together independently and stand 1 minute safely From Standing, Reach Forward with Outstretched Arm: Can reach confidently >25 cm (10") From Standing Position, Pick up Object from Floor: Able to pick up shoe safely and easily From Standing Position, Turn to Look Behind Over each Shoulder: Looks behind from both sides and weight shifts well Turn 360 Degrees: Able to turn 360 degrees safely in 4 seconds or less Standing Unsupported, Alternately Place Feet on Step/Stool: Able to stand independently and safely and complete 8 steps in 20 seconds Standing Unsupported, One Foot in Front: Able to plae foot ahead of the other independently and hold 30 seconds Standing on One Leg: Able to lift leg independently and hold equal to or more than 3 seconds Total Score: 53   Stretches Active Hamstring Stretch: 3 reps;30 seconds Hip Flexor Stretch: 4 reps;20 seconds Standing Functional Squat: 10 reps;Limitations Functional Squat Limitations: squat matrix     Physical Therapy Assessment and Plan PT Assessment and Plan Clinical Impression Statement: Patient has met or partially met all goals and will be able to achieve remaining long term goals independently with HEP. Session focused on education of exercises to be performed as part of HEP to continue strengtheing hip and trunk muscles to decrease strain on low back. Patient educated with HEP, however required minimal cues for correct performance of exercises.  Noted drainage from inside Rt wrist at old dialysis site.  Pt encouraged to contact MD regarding this.  Bandaid placed over site. PT Plan: Patient to be discharged per PT POC.  Pt provided with hand out for squat reach matrix, 3D UE weight  shifts and LE stretches.    Goals PT Short Term Goals PT Short Term Goal 1: Patient will demosntrate increased abdominal muscle strength to a 4/5 MMT to be able to do a sit up with  arms over chest PT Short Term Goal 1 - Progress: Met PT Short Term Goal 2: Patient will demsontrate increased trunk rotation bilaterally to WNL PT Short Term Goal 2 - Progress: Met PT Short Term Goal 3: Patient will dmeosntrate increased glut med/max strength to 3/5 MMT so be bale to stand up right PT Short Term Goal 3 - Progress: Partly met PT Long Term Goals PT Long Term Goal 1: Patient will demonstrate increased abdominal strength to 5/5MT to be able to sit up out of bed withtou difficulty PT Long Term Goal 1 - Progress: Met PT Long Term Goal 2: Patient will demonstrate increased glut med/max strength to 4/5  MMT toimprove hip stability so that pelvis remains in alignment for >1 week.  PT Long Term Goal 2 - Progress: Partly met Long Term Goal 3: Patient able to ambulate up a flight of stairs without handrail assitance Long Term Goal 3 Progress: Met Long Term Goal 4: Improve Berg balance score to >52 inmdicating improved balance and decreased risk of falls Long Term Goal 4 Progress: Met  Problem List Patient Active Problem List   Diagnosis Date Noted  . Encounter for adequacy testing for hemodialysis 01/17/2014  . Mechanical complication of other vascular device, implant, and graft 01/17/2014  . Disturbances of vision, late effect of stroke 11/03/2013  . Right hemiparesis 08/19/2013  . CVA (cerebral infarction) 07/20/2013  . Gastroparesis due to DM 07/01/2013  . End stage renal disease 06/25/2013  . Anemia 01/26/2013  . DM W/NEURO MNFST, TYPE II, UNCONTROLLED 11/19/2006  . LEUKOCYTOSIS 11/19/2006  . DEPENDENT EDEMA, LEGS, BILATERAL 11/19/2006  . SINUS TACHYCARDIA 10/16/2006  . DIABETES MELLITUS, TYPE II 05/20/2006  . HYPERLIPIDEMIA 05/20/2006  . SYNDROME, RESTLESS LEGS 05/20/2006  . PERIPHERAL NEUROPATHY 05/20/2006  . HYPERTENSION 05/20/2006       GP Functional Assessment Tool Used: FOTO 2% limited was 41% limited Functional Limitation: Changing and maintaining body  position Changing and Maintaining Body Position Goal Status (R7543): At least 40 percent but less than 60 percent impaired, limited or restricted Changing and Maintaining Body Position Discharge Status [phone]): At least 1 percent but less than 20 percent impaired, limited or restricted  Teena Irani, PTA/CLT 03/16/2014, 10:18 AM

## 2014-03-16 NOTE — Progress Notes (Signed)
Physical Therapy Discharge  Patient Details  Name: [name] MRN: [id number] Date of Birth: Aug 14, 1940  Today's Date: 03/16/2014 Time: 1638-4536 PT Time Calculation (min): 35 min              Visit#: 10 of 16  Re-eval: 04/13/14 Assessment Diagnosis: Rt Low back pain secondary to stiffness in pelvis and generalized weakness Next MD Visit: Dr. [name]. Refferal from [place]: Medicare    Authorization Time Period: Gcode complete on 9th visit  Authorization Visit#: 10 of 19  Charges:  therex [phone], patient education [phone]   Subjective Symptoms/Limitations Symptoms: Pt states she is ready to graduate.  No pain or complaints today. Pain Assessment Currently in Pain?: No/denies  Objective All objective measures takne 03/14/14 by Devona Konig, DPT Sensation/Coordination/Flexibility/Functional Tests Functional Tests Functional Tests: Elys Test Rt 9 1/4 in, Lt 10 in (heel to glute) Assessment RLE Strength RLE Overall Strength Comments: strength equal on both sides Right Hip Flexion: 5/5 Right Hip Extension: 2+/5 (very strong but lacks full ROM) Right Hip ABduction: 3+/5 Right Knee Flexion: 5/5 (4+/5) Right Knee Extension: 5/5 Right Ankle Dorsiflexion: 5/5 Lumbar AROM Lumbar Flexion: WNL Lumbar Extension: WNL Lumbar - Right Side Bend: WNL Lumbar - Left Side Bend: WNL Lumbar - Right Rotation: 45 degrees Lumbar - Left Rotation: 45 degrees Lumbar Strength Lumbar Flexion: 4/5 Lumbar Extension: 5/5  Exercise/Treatments Mobility/Balance  Berg Balance Test Sit to Stand: Able to stand without using hands and stabilize independently Standing Unsupported: Able to stand safely 2 minutes Sitting with Back Unsupported but Feet Supported on Floor or Stool: Able to sit safely and securely 2 minutes Stand to Sit: Sits safely with minimal use of hands Transfers: Able to transfer safely, minor use of hands Standing Unsupported with Eyes Closed: Able to  stand 10 seconds safely Standing Ubsupported with Feet Together: Able to place feet together independently and stand 1 minute safely From Standing, Reach Forward with Outstretched Arm: Can reach confidently >25 cm (10") From Standing Position, Pick up Object from Floor: Able to pick up shoe safely and easily From Standing Position, Turn to Look Behind Over each Shoulder: Looks behind from both sides and weight shifts well Turn 360 Degrees: Able to turn 360 degrees safely in 4 seconds or less Standing Unsupported, Alternately Place Feet on Step/Stool: Able to stand independently and safely and complete 8 steps in 20 seconds Standing Unsupported, One Foot in Front: Able to plae foot ahead of the other independently and hold 30 seconds Standing on One Leg: Able to lift leg independently and hold equal to or more than 3 seconds Total Score: 53   Stretches Active Hamstring Stretch: 3 reps;30 seconds Hip Flexor Stretch: 4 reps;20 seconds Standing Functional Squat: 10 reps;Limitations Functional Squat Limitations: squat matrix     Physical Therapy Assessment and Plan PT Assessment and Plan Clinical Impression Statement: Patient has met or partially met all goals and will be able to achieve remaining long term goals independently with HEP. Session focused on education of exercises to be performed as part of HEP to continue strengtheing hip and trunk muscles to decrease strain on low back. Patient educated with HEP, however required minimal cues for correct performance of exercises.  Noted drainage from inside Rt wrist at old dialysis site.  Pt encouraged to contact MD regarding this.  Bandaid placed over site. PT Plan: Patient to be discharged per PT POC.  Pt provided with hand out for squat reach matrix, 3D UE weight  shifts and LE stretches.    Goals PT Short Term Goals PT Short Term Goal 1: Patient will demosntrate increased abdominal muscle strength to a 4/5 MMT to be able to do a sit up with  arms over chest PT Short Term Goal 1 - Progress: Met PT Short Term Goal 2: Patient will demsontrate increased trunk rotation bilaterally to WNL PT Short Term Goal 2 - Progress: Met PT Short Term Goal 3: Patient will dmeosntrate increased glut med/max strength to 3/5 MMT so be bale to stand up right PT Short Term Goal 3 - Progress: Partly met PT Long Term Goals PT Long Term Goal 1: Patient will demonstrate increased abdominal strength to 5/5MT to be able to sit up out of bed withtou difficulty PT Long Term Goal 1 - Progress: Met PT Long Term Goal 2: Patient will demonstrate increased glut med/max strength to 4/5  MMT toimprove hip stability so that pelvis remains in alignment for >1 week.  PT Long Term Goal 2 - Progress: Partly met Long Term Goal 3: Patient able to ambulate up a flight of stairs without handrail assitance Long Term Goal 3 Progress: Met Long Term Goal 4: Improve Berg balance score to >52 inmdicating improved balance and decreased risk of falls Long Term Goal 4 Progress: Met  Problem List Patient Active Problem List   Diagnosis Date Noted  . Encounter for adequacy testing for hemodialysis 01/17/2014  . Mechanical complication of other vascular device, implant, and graft 01/17/2014  . Disturbances of vision, late effect of stroke 11/03/2013  . Right hemiparesis 08/19/2013  . CVA (cerebral infarction) 07/20/2013  . Gastroparesis due to DM 07/01/2013  . End stage renal disease 06/25/2013  . Anemia 01/26/2013  . DM W/NEURO MNFST, TYPE II, UNCONTROLLED 11/19/2006  . LEUKOCYTOSIS 11/19/2006  . DEPENDENT EDEMA, LEGS, BILATERAL 11/19/2006  . SINUS TACHYCARDIA 10/16/2006  . DIABETES MELLITUS, TYPE II 05/20/2006  . HYPERLIPIDEMIA 05/20/2006  . SYNDROME, RESTLESS LEGS 05/20/2006  . PERIPHERAL NEUROPATHY 05/20/2006  . HYPERTENSION 05/20/2006       GP Functional Assessment Tool Used: FOTO 2% limited was 41% limited Functional Limitation: Changing and maintaining body  position Changing and Maintaining Body Position Goal Status (Z7356): At least 40 percent but less than 60 percent impaired, limited or restricted Changing and Maintaining Body Position Discharge Status [phone]): At least 1 percent but less than 20 percent impaired, limited or restricted  Teena Irani, PTA/CLT 03/16/2014, 10:18 AM  Devona Konig PT DPT

## 2014-03-21 ENCOUNTER — Encounter: Payer: Self-pay | Admitting: Vascular Surgery

## 2014-03-21 ENCOUNTER — Encounter: Payer: Medicare Other | Admitting: Vascular Surgery

## 2014-03-21 ENCOUNTER — Ambulatory Visit (HOSPITAL_COMMUNITY): Payer: Medicare Other | Admitting: Physical Therapy

## 2014-03-21 ENCOUNTER — Ambulatory Visit (INDEPENDENT_AMBULATORY_CARE_PROVIDER_SITE_OTHER): Payer: Self-pay | Admitting: Vascular Surgery

## 2014-03-21 VITALS — BP 135/68 | HR 81 | Resp 18 | Ht 66.5 in | Wt 166.6 lb

## 2014-03-21 DIAGNOSIS — N186 End stage renal disease: Secondary | ICD-10-CM

## 2014-03-21 NOTE — Progress Notes (Signed)
Subjective:     Patient ID: CHANNELLE LANGILL, female   DOB: Aug 31, 1940, 73 y.o.   MRN: [id number]  HPI this 73 year old female returns for initial followup regarding her right basilic vein transposition which I performed on 01/26/2014. She also had ligation of a poorly developed right radial cephalic AV fistula. She has had no pain or numbness in the right hand. She does have some numbness in the proximal right forearm distal to the incisions. She is on dialysis through a catheter in the right IJ.  Review of Systems     Objective:   Physical Exam BP 135/68  Pulse 81  Resp 18  Ht 5' 6.5" (1.689 m)  Wt 166 lb 9.6 oz (75.569 kg)  BMI 26.49 kg/m2  General alert and oriented x3 no apparent distress Right upper extremity has excellent pulse and palpable thrill in upper arm basilic vein transposition. Right hand is well-perfused with 2+ radial pulse palpable. All incisions well-healed.      Assessment:     Nicely functioning right basilic vein transposition created 01/26/2014-cannot use for 3 months from time of surgery    Plan:     Okay to use fistula beginning 04/28/2014 We'll return to see [place] on when necessary basis

## 2014-03-23 ENCOUNTER — Ambulatory Visit (HOSPITAL_COMMUNITY): Payer: Medicare Other | Admitting: Physical Therapy

## 2014-03-24 ENCOUNTER — Ambulatory Visit (INDEPENDENT_AMBULATORY_CARE_PROVIDER_SITE_OTHER): Payer: Medicare Other | Admitting: *Deleted

## 2014-03-24 DIAGNOSIS — I639 Cerebral infarction, unspecified: Secondary | ICD-10-CM

## 2014-03-24 LAB — MDC_IDC_ENUM_SESS_TYPE_REMOTE

## 2014-03-28 ENCOUNTER — Ambulatory Visit (HOSPITAL_COMMUNITY): Payer: Medicare Other | Admitting: Physical Therapy

## 2014-03-29 NOTE — Progress Notes (Signed)
Loop recorder 

## 2014-03-30 ENCOUNTER — Ambulatory Visit (HOSPITAL_COMMUNITY): Payer: Medicare Other

## 2014-04-04 ENCOUNTER — Ambulatory Visit (HOSPITAL_COMMUNITY): Payer: Medicare Other | Admitting: Physical Therapy

## 2014-04-06 ENCOUNTER — Ambulatory Visit (HOSPITAL_COMMUNITY): Payer: Medicare Other | Admitting: Physical Therapy

## 2014-04-14 DIAGNOSIS — Z5189 Encounter for other specified aftercare: Secondary | ICD-10-CM | POA: Diagnosis not present

## 2014-04-14 NOTE — Addendum Note (Signed)
Encounter addended by: Leia Alf, PT on: 04/14/2014  8:15 AM<BR>     Documentation filed: Clinical Notes, Flowsheet VN

## 2014-04-20 ENCOUNTER — Encounter: Payer: Self-pay | Admitting: Internal Medicine

## 2014-04-25 ENCOUNTER — Ambulatory Visit (INDEPENDENT_AMBULATORY_CARE_PROVIDER_SITE_OTHER): Payer: Medicare Other | Admitting: *Deleted

## 2014-04-25 DIAGNOSIS — I639 Cerebral infarction, unspecified: Secondary | ICD-10-CM

## 2014-05-03 NOTE — Progress Notes (Signed)
Loop recorder 

## 2014-05-05 LAB — MDC_IDC_ENUM_SESS_TYPE_REMOTE

## 2014-05-18 ENCOUNTER — Encounter: Payer: Self-pay | Admitting: Internal Medicine

## 2014-05-25 ENCOUNTER — Encounter (HOSPITAL_COMMUNITY): Payer: Self-pay | Admitting: Internal Medicine

## 2014-05-25 ENCOUNTER — Ambulatory Visit (INDEPENDENT_AMBULATORY_CARE_PROVIDER_SITE_OTHER): Payer: Medicare Other | Admitting: *Deleted

## 2014-05-25 DIAGNOSIS — I639 Cerebral infarction, unspecified: Secondary | ICD-10-CM

## 2014-05-26 NOTE — Progress Notes (Signed)
Loop recorder 

## 2014-06-14 LAB — MDC_IDC_ENUM_SESS_TYPE_REMOTE

## 2014-06-16 DIAGNOSIS — R7881 Bacteremia: Secondary | ICD-10-CM | POA: Diagnosis not present

## 2014-06-16 DIAGNOSIS — N186 End stage renal disease: Secondary | ICD-10-CM | POA: Diagnosis not present

## 2014-06-16 DIAGNOSIS — D631 Anemia in chronic kidney disease: Secondary | ICD-10-CM | POA: Diagnosis not present

## 2014-06-16 DIAGNOSIS — B9689 Other specified bacterial agents as the cause of diseases classified elsewhere: Secondary | ICD-10-CM | POA: Diagnosis not present

## 2014-06-16 DIAGNOSIS — D509 Iron deficiency anemia, unspecified: Secondary | ICD-10-CM | POA: Diagnosis not present

## 2014-06-16 DIAGNOSIS — N2581 Secondary hyperparathyroidism of renal origin: Secondary | ICD-10-CM | POA: Diagnosis not present

## 2014-06-16 DIAGNOSIS — Z992 Dependence on renal dialysis: Secondary | ICD-10-CM | POA: Diagnosis not present

## 2014-06-19 DIAGNOSIS — N186 End stage renal disease: Secondary | ICD-10-CM | POA: Diagnosis not present

## 2014-06-19 DIAGNOSIS — B9689 Other specified bacterial agents as the cause of diseases classified elsewhere: Secondary | ICD-10-CM | POA: Diagnosis not present

## 2014-06-19 DIAGNOSIS — D509 Iron deficiency anemia, unspecified: Secondary | ICD-10-CM | POA: Diagnosis not present

## 2014-06-19 DIAGNOSIS — N2581 Secondary hyperparathyroidism of renal origin: Secondary | ICD-10-CM | POA: Diagnosis not present

## 2014-06-19 DIAGNOSIS — D631 Anemia in chronic kidney disease: Secondary | ICD-10-CM | POA: Diagnosis not present

## 2014-06-19 DIAGNOSIS — R7881 Bacteremia: Secondary | ICD-10-CM | POA: Diagnosis not present

## 2014-06-19 DIAGNOSIS — Z992 Dependence on renal dialysis: Secondary | ICD-10-CM | POA: Diagnosis not present

## 2014-06-21 DIAGNOSIS — R7881 Bacteremia: Secondary | ICD-10-CM | POA: Diagnosis not present

## 2014-06-21 DIAGNOSIS — D631 Anemia in chronic kidney disease: Secondary | ICD-10-CM | POA: Diagnosis not present

## 2014-06-21 DIAGNOSIS — Z992 Dependence on renal dialysis: Secondary | ICD-10-CM | POA: Diagnosis not present

## 2014-06-21 DIAGNOSIS — N186 End stage renal disease: Secondary | ICD-10-CM | POA: Diagnosis not present

## 2014-06-21 DIAGNOSIS — D509 Iron deficiency anemia, unspecified: Secondary | ICD-10-CM | POA: Diagnosis not present

## 2014-06-21 DIAGNOSIS — B9689 Other specified bacterial agents as the cause of diseases classified elsewhere: Secondary | ICD-10-CM | POA: Diagnosis not present

## 2014-06-21 DIAGNOSIS — N2581 Secondary hyperparathyroidism of renal origin: Secondary | ICD-10-CM | POA: Diagnosis not present

## 2014-06-23 ENCOUNTER — Ambulatory Visit (INDEPENDENT_AMBULATORY_CARE_PROVIDER_SITE_OTHER): Payer: Medicare Other | Admitting: *Deleted

## 2014-06-23 DIAGNOSIS — R7881 Bacteremia: Secondary | ICD-10-CM | POA: Diagnosis not present

## 2014-06-23 DIAGNOSIS — N2581 Secondary hyperparathyroidism of renal origin: Secondary | ICD-10-CM | POA: Diagnosis not present

## 2014-06-23 DIAGNOSIS — I639 Cerebral infarction, unspecified: Secondary | ICD-10-CM

## 2014-06-23 DIAGNOSIS — N186 End stage renal disease: Secondary | ICD-10-CM | POA: Diagnosis not present

## 2014-06-23 DIAGNOSIS — D509 Iron deficiency anemia, unspecified: Secondary | ICD-10-CM | POA: Diagnosis not present

## 2014-06-23 DIAGNOSIS — D631 Anemia in chronic kidney disease: Secondary | ICD-10-CM | POA: Diagnosis not present

## 2014-06-23 DIAGNOSIS — B9689 Other specified bacterial agents as the cause of diseases classified elsewhere: Secondary | ICD-10-CM | POA: Diagnosis not present

## 2014-06-23 DIAGNOSIS — Z992 Dependence on renal dialysis: Secondary | ICD-10-CM | POA: Diagnosis not present

## 2014-06-26 DIAGNOSIS — N186 End stage renal disease: Secondary | ICD-10-CM | POA: Diagnosis not present

## 2014-06-26 DIAGNOSIS — B9689 Other specified bacterial agents as the cause of diseases classified elsewhere: Secondary | ICD-10-CM | POA: Diagnosis not present

## 2014-06-26 DIAGNOSIS — D509 Iron deficiency anemia, unspecified: Secondary | ICD-10-CM | POA: Diagnosis not present

## 2014-06-26 DIAGNOSIS — Z992 Dependence on renal dialysis: Secondary | ICD-10-CM | POA: Diagnosis not present

## 2014-06-26 DIAGNOSIS — R7881 Bacteremia: Secondary | ICD-10-CM | POA: Diagnosis not present

## 2014-06-26 DIAGNOSIS — N2581 Secondary hyperparathyroidism of renal origin: Secondary | ICD-10-CM | POA: Diagnosis not present

## 2014-06-26 DIAGNOSIS — D631 Anemia in chronic kidney disease: Secondary | ICD-10-CM | POA: Diagnosis not present

## 2014-06-27 ENCOUNTER — Encounter: Payer: Self-pay | Admitting: Internal Medicine

## 2014-06-28 DIAGNOSIS — N2581 Secondary hyperparathyroidism of renal origin: Secondary | ICD-10-CM | POA: Diagnosis not present

## 2014-06-28 DIAGNOSIS — D631 Anemia in chronic kidney disease: Secondary | ICD-10-CM | POA: Diagnosis not present

## 2014-06-28 DIAGNOSIS — B9689 Other specified bacterial agents as the cause of diseases classified elsewhere: Secondary | ICD-10-CM | POA: Diagnosis not present

## 2014-06-28 DIAGNOSIS — Z992 Dependence on renal dialysis: Secondary | ICD-10-CM | POA: Diagnosis not present

## 2014-06-28 DIAGNOSIS — R7881 Bacteremia: Secondary | ICD-10-CM | POA: Diagnosis not present

## 2014-06-28 DIAGNOSIS — N186 End stage renal disease: Secondary | ICD-10-CM | POA: Diagnosis not present

## 2014-06-28 DIAGNOSIS — D509 Iron deficiency anemia, unspecified: Secondary | ICD-10-CM | POA: Diagnosis not present

## 2014-06-30 DIAGNOSIS — D509 Iron deficiency anemia, unspecified: Secondary | ICD-10-CM | POA: Diagnosis not present

## 2014-06-30 DIAGNOSIS — B9689 Other specified bacterial agents as the cause of diseases classified elsewhere: Secondary | ICD-10-CM | POA: Diagnosis not present

## 2014-06-30 DIAGNOSIS — R7881 Bacteremia: Secondary | ICD-10-CM | POA: Diagnosis not present

## 2014-06-30 DIAGNOSIS — N2581 Secondary hyperparathyroidism of renal origin: Secondary | ICD-10-CM | POA: Diagnosis not present

## 2014-06-30 DIAGNOSIS — D631 Anemia in chronic kidney disease: Secondary | ICD-10-CM | POA: Diagnosis not present

## 2014-06-30 DIAGNOSIS — Z992 Dependence on renal dialysis: Secondary | ICD-10-CM | POA: Diagnosis not present

## 2014-06-30 DIAGNOSIS — N186 End stage renal disease: Secondary | ICD-10-CM | POA: Diagnosis not present

## 2014-06-30 NOTE — Progress Notes (Signed)
Loop recorder 

## 2014-07-03 DIAGNOSIS — D509 Iron deficiency anemia, unspecified: Secondary | ICD-10-CM | POA: Diagnosis not present

## 2014-07-03 DIAGNOSIS — R7881 Bacteremia: Secondary | ICD-10-CM | POA: Diagnosis not present

## 2014-07-03 DIAGNOSIS — N2581 Secondary hyperparathyroidism of renal origin: Secondary | ICD-10-CM | POA: Diagnosis not present

## 2014-07-03 DIAGNOSIS — Z992 Dependence on renal dialysis: Secondary | ICD-10-CM | POA: Diagnosis not present

## 2014-07-03 DIAGNOSIS — B9689 Other specified bacterial agents as the cause of diseases classified elsewhere: Secondary | ICD-10-CM | POA: Diagnosis not present

## 2014-07-03 DIAGNOSIS — D631 Anemia in chronic kidney disease: Secondary | ICD-10-CM | POA: Diagnosis not present

## 2014-07-03 DIAGNOSIS — N186 End stage renal disease: Secondary | ICD-10-CM | POA: Diagnosis not present

## 2014-07-05 DIAGNOSIS — R7881 Bacteremia: Secondary | ICD-10-CM | POA: Diagnosis not present

## 2014-07-05 DIAGNOSIS — N186 End stage renal disease: Secondary | ICD-10-CM | POA: Diagnosis not present

## 2014-07-05 DIAGNOSIS — D509 Iron deficiency anemia, unspecified: Secondary | ICD-10-CM | POA: Diagnosis not present

## 2014-07-05 DIAGNOSIS — N2581 Secondary hyperparathyroidism of renal origin: Secondary | ICD-10-CM | POA: Diagnosis not present

## 2014-07-05 DIAGNOSIS — D631 Anemia in chronic kidney disease: Secondary | ICD-10-CM | POA: Diagnosis not present

## 2014-07-05 DIAGNOSIS — Z992 Dependence on renal dialysis: Secondary | ICD-10-CM | POA: Diagnosis not present

## 2014-07-05 DIAGNOSIS — B9689 Other specified bacterial agents as the cause of diseases classified elsewhere: Secondary | ICD-10-CM | POA: Diagnosis not present

## 2014-07-07 DIAGNOSIS — R7881 Bacteremia: Secondary | ICD-10-CM | POA: Diagnosis not present

## 2014-07-07 DIAGNOSIS — Z992 Dependence on renal dialysis: Secondary | ICD-10-CM | POA: Diagnosis not present

## 2014-07-07 DIAGNOSIS — B9689 Other specified bacterial agents as the cause of diseases classified elsewhere: Secondary | ICD-10-CM | POA: Diagnosis not present

## 2014-07-07 DIAGNOSIS — N2581 Secondary hyperparathyroidism of renal origin: Secondary | ICD-10-CM | POA: Diagnosis not present

## 2014-07-07 DIAGNOSIS — N186 End stage renal disease: Secondary | ICD-10-CM | POA: Diagnosis not present

## 2014-07-07 DIAGNOSIS — D631 Anemia in chronic kidney disease: Secondary | ICD-10-CM | POA: Diagnosis not present

## 2014-07-07 DIAGNOSIS — D509 Iron deficiency anemia, unspecified: Secondary | ICD-10-CM | POA: Diagnosis not present

## 2014-07-10 DIAGNOSIS — N2581 Secondary hyperparathyroidism of renal origin: Secondary | ICD-10-CM | POA: Diagnosis not present

## 2014-07-10 DIAGNOSIS — D631 Anemia in chronic kidney disease: Secondary | ICD-10-CM | POA: Diagnosis not present

## 2014-07-10 DIAGNOSIS — D509 Iron deficiency anemia, unspecified: Secondary | ICD-10-CM | POA: Diagnosis not present

## 2014-07-10 DIAGNOSIS — N186 End stage renal disease: Secondary | ICD-10-CM | POA: Diagnosis not present

## 2014-07-10 DIAGNOSIS — R7881 Bacteremia: Secondary | ICD-10-CM | POA: Diagnosis not present

## 2014-07-10 DIAGNOSIS — B9689 Other specified bacterial agents as the cause of diseases classified elsewhere: Secondary | ICD-10-CM | POA: Diagnosis not present

## 2014-07-10 DIAGNOSIS — Z992 Dependence on renal dialysis: Secondary | ICD-10-CM | POA: Diagnosis not present

## 2014-07-11 DIAGNOSIS — E1122 Type 2 diabetes mellitus with diabetic chronic kidney disease: Secondary | ICD-10-CM | POA: Diagnosis not present

## 2014-07-11 DIAGNOSIS — N186 End stage renal disease: Secondary | ICD-10-CM | POA: Diagnosis not present

## 2014-07-11 DIAGNOSIS — I120 Hypertensive chronic kidney disease with stage 5 chronic kidney disease or end stage renal disease: Secondary | ICD-10-CM | POA: Diagnosis not present

## 2014-07-12 ENCOUNTER — Ambulatory Visit (HOSPITAL_COMMUNITY)
Admission: RE | Admit: 2014-07-12 | Discharge: 2014-07-12 | Disposition: A | Discharge status: Home / Self Care | Payer: Medicare Other | Source: Ambulatory Visit | Attending: Family Medicine | Admitting: Family Medicine

## 2014-07-12 ENCOUNTER — Other Ambulatory Visit (HOSPITAL_COMMUNITY): Payer: Self-pay | Admitting: Family Medicine

## 2014-07-12 DIAGNOSIS — I517 Cardiomegaly: Secondary | ICD-10-CM | POA: Insufficient documentation

## 2014-07-12 DIAGNOSIS — R0602 Shortness of breath: Secondary | ICD-10-CM | POA: Insufficient documentation

## 2014-07-12 DIAGNOSIS — D631 Anemia in chronic kidney disease: Secondary | ICD-10-CM | POA: Diagnosis not present

## 2014-07-12 DIAGNOSIS — N2581 Secondary hyperparathyroidism of renal origin: Secondary | ICD-10-CM | POA: Diagnosis not present

## 2014-07-12 DIAGNOSIS — Z992 Dependence on renal dialysis: Secondary | ICD-10-CM | POA: Diagnosis not present

## 2014-07-12 DIAGNOSIS — R7881 Bacteremia: Secondary | ICD-10-CM | POA: Diagnosis not present

## 2014-07-12 DIAGNOSIS — B9689 Other specified bacterial agents as the cause of diseases classified elsewhere: Secondary | ICD-10-CM | POA: Diagnosis not present

## 2014-07-12 DIAGNOSIS — N186 End stage renal disease: Secondary | ICD-10-CM | POA: Diagnosis not present

## 2014-07-12 DIAGNOSIS — D509 Iron deficiency anemia, unspecified: Secondary | ICD-10-CM | POA: Diagnosis not present

## 2014-07-14 DIAGNOSIS — D509 Iron deficiency anemia, unspecified: Secondary | ICD-10-CM | POA: Diagnosis not present

## 2014-07-14 DIAGNOSIS — N186 End stage renal disease: Secondary | ICD-10-CM | POA: Diagnosis not present

## 2014-07-14 DIAGNOSIS — B9689 Other specified bacterial agents as the cause of diseases classified elsewhere: Secondary | ICD-10-CM | POA: Diagnosis not present

## 2014-07-14 DIAGNOSIS — R7881 Bacteremia: Secondary | ICD-10-CM | POA: Diagnosis not present

## 2014-07-14 DIAGNOSIS — Z992 Dependence on renal dialysis: Secondary | ICD-10-CM | POA: Diagnosis not present

## 2014-07-14 DIAGNOSIS — N2581 Secondary hyperparathyroidism of renal origin: Secondary | ICD-10-CM | POA: Diagnosis not present

## 2014-07-14 DIAGNOSIS — D631 Anemia in chronic kidney disease: Secondary | ICD-10-CM | POA: Diagnosis not present

## 2014-07-16 DIAGNOSIS — N186 End stage renal disease: Secondary | ICD-10-CM | POA: Diagnosis not present

## 2014-07-16 DIAGNOSIS — Z992 Dependence on renal dialysis: Secondary | ICD-10-CM | POA: Diagnosis not present

## 2014-07-17 DIAGNOSIS — Z992 Dependence on renal dialysis: Secondary | ICD-10-CM | POA: Diagnosis not present

## 2014-07-17 DIAGNOSIS — D509 Iron deficiency anemia, unspecified: Secondary | ICD-10-CM | POA: Diagnosis not present

## 2014-07-17 DIAGNOSIS — N2581 Secondary hyperparathyroidism of renal origin: Secondary | ICD-10-CM | POA: Diagnosis not present

## 2014-07-17 DIAGNOSIS — D631 Anemia in chronic kidney disease: Secondary | ICD-10-CM | POA: Diagnosis not present

## 2014-07-17 DIAGNOSIS — N186 End stage renal disease: Secondary | ICD-10-CM | POA: Diagnosis not present

## 2014-07-18 LAB — MDC_IDC_ENUM_SESS_TYPE_REMOTE

## 2014-07-19 DIAGNOSIS — D509 Iron deficiency anemia, unspecified: Secondary | ICD-10-CM | POA: Diagnosis not present

## 2014-07-19 DIAGNOSIS — Z992 Dependence on renal dialysis: Secondary | ICD-10-CM | POA: Diagnosis not present

## 2014-07-19 DIAGNOSIS — D631 Anemia in chronic kidney disease: Secondary | ICD-10-CM | POA: Diagnosis not present

## 2014-07-19 DIAGNOSIS — N2581 Secondary hyperparathyroidism of renal origin: Secondary | ICD-10-CM | POA: Diagnosis not present

## 2014-07-19 DIAGNOSIS — N186 End stage renal disease: Secondary | ICD-10-CM | POA: Diagnosis not present

## 2014-07-21 DIAGNOSIS — D631 Anemia in chronic kidney disease: Secondary | ICD-10-CM | POA: Diagnosis not present

## 2014-07-21 DIAGNOSIS — Z992 Dependence on renal dialysis: Secondary | ICD-10-CM | POA: Diagnosis not present

## 2014-07-21 DIAGNOSIS — D509 Iron deficiency anemia, unspecified: Secondary | ICD-10-CM | POA: Diagnosis not present

## 2014-07-21 DIAGNOSIS — N2581 Secondary hyperparathyroidism of renal origin: Secondary | ICD-10-CM | POA: Diagnosis not present

## 2014-07-21 DIAGNOSIS — N186 End stage renal disease: Secondary | ICD-10-CM | POA: Diagnosis not present

## 2014-07-24 ENCOUNTER — Ambulatory Visit (INDEPENDENT_AMBULATORY_CARE_PROVIDER_SITE_OTHER): Payer: Medicare Other | Admitting: *Deleted

## 2014-07-24 DIAGNOSIS — M79671 Pain in right foot: Secondary | ICD-10-CM | POA: Diagnosis not present

## 2014-07-24 DIAGNOSIS — Z992 Dependence on renal dialysis: Secondary | ICD-10-CM | POA: Diagnosis not present

## 2014-07-24 DIAGNOSIS — L609 Nail disorder, unspecified: Secondary | ICD-10-CM | POA: Diagnosis not present

## 2014-07-24 DIAGNOSIS — N186 End stage renal disease: Secondary | ICD-10-CM | POA: Diagnosis not present

## 2014-07-24 DIAGNOSIS — D631 Anemia in chronic kidney disease: Secondary | ICD-10-CM | POA: Diagnosis not present

## 2014-07-24 DIAGNOSIS — I739 Peripheral vascular disease, unspecified: Secondary | ICD-10-CM | POA: Diagnosis not present

## 2014-07-24 DIAGNOSIS — I639 Cerebral infarction, unspecified: Secondary | ICD-10-CM | POA: Diagnosis not present

## 2014-07-24 DIAGNOSIS — N2581 Secondary hyperparathyroidism of renal origin: Secondary | ICD-10-CM | POA: Diagnosis not present

## 2014-07-24 DIAGNOSIS — D509 Iron deficiency anemia, unspecified: Secondary | ICD-10-CM | POA: Diagnosis not present

## 2014-07-24 DIAGNOSIS — L851 Acquired keratosis [keratoderma] palmaris et plantaris: Secondary | ICD-10-CM | POA: Diagnosis not present

## 2014-07-24 DIAGNOSIS — B351 Tinea unguium: Secondary | ICD-10-CM | POA: Diagnosis not present

## 2014-07-25 LAB — MDC_IDC_ENUM_SESS_TYPE_REMOTE

## 2014-07-26 DIAGNOSIS — Z992 Dependence on renal dialysis: Secondary | ICD-10-CM | POA: Diagnosis not present

## 2014-07-26 DIAGNOSIS — N2581 Secondary hyperparathyroidism of renal origin: Secondary | ICD-10-CM | POA: Diagnosis not present

## 2014-07-26 DIAGNOSIS — D509 Iron deficiency anemia, unspecified: Secondary | ICD-10-CM | POA: Diagnosis not present

## 2014-07-26 DIAGNOSIS — N186 End stage renal disease: Secondary | ICD-10-CM | POA: Diagnosis not present

## 2014-07-26 DIAGNOSIS — D631 Anemia in chronic kidney disease: Secondary | ICD-10-CM | POA: Diagnosis not present

## 2014-07-26 NOTE — Progress Notes (Signed)
Loop recorder 

## 2014-07-28 DIAGNOSIS — N2581 Secondary hyperparathyroidism of renal origin: Secondary | ICD-10-CM | POA: Diagnosis not present

## 2014-07-28 DIAGNOSIS — Z992 Dependence on renal dialysis: Secondary | ICD-10-CM | POA: Diagnosis not present

## 2014-07-28 DIAGNOSIS — D631 Anemia in chronic kidney disease: Secondary | ICD-10-CM | POA: Diagnosis not present

## 2014-07-28 DIAGNOSIS — D509 Iron deficiency anemia, unspecified: Secondary | ICD-10-CM | POA: Diagnosis not present

## 2014-07-28 DIAGNOSIS — N186 End stage renal disease: Secondary | ICD-10-CM | POA: Diagnosis not present

## 2014-07-31 DIAGNOSIS — Z992 Dependence on renal dialysis: Secondary | ICD-10-CM | POA: Diagnosis not present

## 2014-07-31 DIAGNOSIS — N186 End stage renal disease: Secondary | ICD-10-CM | POA: Diagnosis not present

## 2014-07-31 DIAGNOSIS — D631 Anemia in chronic kidney disease: Secondary | ICD-10-CM | POA: Diagnosis not present

## 2014-07-31 DIAGNOSIS — N2581 Secondary hyperparathyroidism of renal origin: Secondary | ICD-10-CM | POA: Diagnosis not present

## 2014-07-31 DIAGNOSIS — D509 Iron deficiency anemia, unspecified: Secondary | ICD-10-CM | POA: Diagnosis not present

## 2014-08-02 ENCOUNTER — Encounter (HOSPITAL_COMMUNITY): Payer: Self-pay | Admitting: Emergency Medicine

## 2014-08-02 ENCOUNTER — Emergency Department (HOSPITAL_COMMUNITY)
Admission: EM | Admit: 2014-08-02 | Discharge: 2014-08-02 | Disposition: A | Discharge status: Home / Self Care | Payer: Medicare Other | Attending: Emergency Medicine | Admitting: Emergency Medicine

## 2014-08-02 ENCOUNTER — Encounter: Payer: Self-pay | Admitting: Internal Medicine

## 2014-08-02 ENCOUNTER — Emergency Department (HOSPITAL_COMMUNITY): Payer: Medicare Other

## 2014-08-02 DIAGNOSIS — Z794 Long term (current) use of insulin: Secondary | ICD-10-CM | POA: Diagnosis not present

## 2014-08-02 DIAGNOSIS — I120 Hypertensive chronic kidney disease with stage 5 chronic kidney disease or end stage renal disease: Secondary | ICD-10-CM | POA: Insufficient documentation

## 2014-08-02 DIAGNOSIS — Z992 Dependence on renal dialysis: Secondary | ICD-10-CM | POA: Diagnosis not present

## 2014-08-02 DIAGNOSIS — R0602 Shortness of breath: Secondary | ICD-10-CM | POA: Diagnosis not present

## 2014-08-02 DIAGNOSIS — Z79899 Other long term (current) drug therapy: Secondary | ICD-10-CM | POA: Insufficient documentation

## 2014-08-02 DIAGNOSIS — R059 Cough, unspecified: Secondary | ICD-10-CM

## 2014-08-02 DIAGNOSIS — J40 Bronchitis, not specified as acute or chronic: Secondary | ICD-10-CM

## 2014-08-02 DIAGNOSIS — R05 Cough: Secondary | ICD-10-CM

## 2014-08-02 DIAGNOSIS — N186 End stage renal disease: Secondary | ICD-10-CM | POA: Insufficient documentation

## 2014-08-02 DIAGNOSIS — J209 Acute bronchitis, unspecified: Secondary | ICD-10-CM | POA: Insufficient documentation

## 2014-08-02 DIAGNOSIS — E119 Type 2 diabetes mellitus without complications: Secondary | ICD-10-CM | POA: Diagnosis not present

## 2014-08-02 DIAGNOSIS — Z872 Personal history of diseases of the skin and subcutaneous tissue: Secondary | ICD-10-CM | POA: Diagnosis not present

## 2014-08-02 HISTORY — DX: Type 2 diabetes mellitus without complications: E11.9

## 2014-08-02 HISTORY — DX: Disorder of kidney and ureter, unspecified: N28.9

## 2014-08-02 HISTORY — DX: Other specified disorders of the skin and subcutaneous tissue: L98.8

## 2014-08-02 LAB — CBC WITH DIFFERENTIAL/PLATELET
BASOS PCT: 0 % (ref 0–1)
Basophils Absolute: 0 10*3/uL (ref 0.0–0.1)
EOS ABS: 0 10*3/uL (ref 0.0–0.7)
Eosinophils Relative: 0 % (ref 0–5)
HCT: 34.1 % — ABNORMAL LOW (ref 36.0–46.0)
HEMOGLOBIN: 11 g/dL — AB (ref 12.0–15.0)
Lymphocytes Relative: 7 % — ABNORMAL LOW (ref 12–46)
Lymphs Abs: 0.7 10*3/uL (ref 0.7–4.0)
MCH: 30.6 pg (ref 26.0–34.0)
MCHC: 32.3 g/dL (ref 30.0–36.0)
MCV: 94.7 fL (ref 78.0–100.0)
MONOS PCT: 12 % (ref 3–12)
Monocytes Absolute: 1.3 10*3/uL — ABNORMAL HIGH (ref 0.1–1.0)
NEUTROS ABS: 8.4 10*3/uL — AB (ref 1.7–7.7)
NEUTROS PCT: 81 % — AB (ref 43–77)
Platelets: 133 10*3/uL — ABNORMAL LOW (ref 150–400)
RBC: 3.6 MIL/uL — ABNORMAL LOW (ref 3.87–5.11)
RDW: 14.6 % (ref 11.5–15.5)
WBC: 10.4 10*3/uL (ref 4.0–10.5)

## 2014-08-02 LAB — BASIC METABOLIC PANEL
Anion gap: 12 (ref 5–15)
BUN: 31 mg/dL — AB (ref 6–23)
CO2: 24 mmol/L (ref 19–32)
Calcium: 8.6 mg/dL (ref 8.4–10.5)
Chloride: 102 mmol/L (ref 96–112)
Creatinine, Ser: 7.32 mg/dL — ABNORMAL HIGH (ref 0.50–1.10)
GFR calc Af Amer: 6 mL/min — ABNORMAL LOW (ref >90)
GFR calc non Af Amer: 5 mL/min — ABNORMAL LOW (ref >90)
Glucose, Bld: 267 mg/dL — ABNORMAL HIGH (ref 70–99)
Potassium: 4.6 mmol/L (ref 3.5–5.1)
Sodium: 138 mmol/L (ref 135–145)

## 2014-08-02 LAB — CBG MONITORING, ED: Glucose-Capillary: 266 mg/dL — ABNORMAL HIGH (ref 70–99)

## 2014-08-02 MED ORDER — IPRATROPIUM-ALBUTEROL 0.5-2.5 (3) MG/3ML IN SOLN
3.0000 mL | RESPIRATORY_TRACT | Status: DC
  Administered 2014-08-02: 3 mL via RESPIRATORY_TRACT
  Filled 2014-08-02: qty 3

## 2014-08-02 MED ORDER — AZITHROMYCIN 250 MG PO TABS: 250.0000 mg | ORAL_TABLET | Freq: Every day | ORAL | Status: DC

## 2014-08-02 MED ORDER — ALBUTEROL SULFATE HFA 108 (90 BASE) MCG/ACT IN AERS: 1.0000 | INHALATION_SPRAY | Freq: Four times a day (QID) | RESPIRATORY_TRACT | Status: DC | PRN

## 2014-08-02 MED ORDER — PREDNISONE 20 MG PO TABS: 20.0000 mg | ORAL_TABLET | Freq: Every day | ORAL | Status: DC

## 2014-08-02 MED ORDER — HYDROCOD POLST-CHLORPHEN POLST 10-8 MG/5ML PO LQCR
5.0000 mL | Freq: Once | ORAL | Status: AC
  Administered 2014-08-02: 5 mL via ORAL
  Filled 2014-08-02: qty 5

## 2014-08-02 MED ORDER — HYDROCOD POLST-CHLORPHEN POLST 10-8 MG/5ML PO LQCR: 5.0000 mL | Freq: Two times a day (BID) | ORAL | Status: DC

## 2014-08-02 NOTE — ED Provider Notes (Signed)
CSN: [id number]     Arrival date & time 08/02/14  [id number] History   First MD Initiated Contact with Patient 08/02/14 1011     Chief Complaint  Patient presents with  . Shortness of Breath  . Cough      HPI She presents for evaluation of a cough. Has a history of hemodialysis. Dialyzed Monday Wednesday Friday. No fevers. No chills. Frequent cough. No swelling to activities. No PND orthopnea.  Awakened this morning with worsening cough and did not go to dialysis because "getting sick". No symptoms of fluid overload with PND orthopnea or extremity swelling.  Past Medical History  Diagnosis Date  . Renal disorder   . Diabetes mellitus without complication   . Hypertension   . Dialysis patient     [facility], [facility] F  . Fistula     R arm   History reviewed. No pertinent past surgical history. No family history on file. History  Substance Use Topics  . Smoking status: Never Smoker   . Smokeless tobacco: Not on file  . Alcohol Use: No   OB History    No data available     Review of Systems  Constitutional: Negative for fever, chills, diaphoresis, appetite change and fatigue.  HENT: Negative for mouth sores, sore throat and trouble swallowing.   Eyes: Negative for visual disturbance.  Respiratory: Positive for cough, shortness of breath and wheezing. Negative for chest tightness.   Cardiovascular: Negative for chest pain.  Gastrointestinal: Negative for nausea, vomiting, abdominal pain, diarrhea and abdominal distention.  Endocrine: Negative for polydipsia, polyphagia and polyuria.  Genitourinary: Negative for dysuria, frequency and hematuria.  Musculoskeletal: Negative for gait problem.  Skin: Negative for color change, pallor and rash.  Neurological: Negative for dizziness, syncope, light-headedness and headaches.  Hematological: Does not bruise/bleed easily.  Psychiatric/Behavioral: Negative for behavioral problems and confusion.      Allergies  Reglan  Home Medications    Prior to Admission medications   Medication Sig Start Date End Date Taking? Authorizing Provider  ibuprofen (ADVIL,MOTRIN) 200 MG tablet Take 200-400 mg by mouth every 6 (six) hours as needed.   Yes Historical Provider, MD  insulin NPH Human (HUMULIN N,NOVOLIN N) 100 UNIT/ML injection Inject 10 Units into the skin 2 (two) times daily before a meal.   Yes Historical Provider, MD  LABETALOL HCL PO Take 1-3 tablets by mouth 2 (two) times daily. Take 1 on days with dialysis and 3 on days without dialysis.   Yes Historical Provider, MD  multivitamin (RENA-VIT) TABS tablet Take 1 tablet by mouth daily.   Yes Historical Provider, MD  OVER THE COUNTER MEDICATION Take 15 mLs by mouth every 4 (four) hours as needed (cough).   Yes Historical Provider, MD  pantoprazole (PROTONIX) 40 MG tablet Take 40 mg by mouth daily.   Yes Historical Provider, MD  albuterol (PROVENTIL HFA;VENTOLIN HFA) 108 (90 BASE) MCG/ACT inhaler Inhale 1-2 puffs into the lungs every 6 (six) hours as needed for wheezing. 08/02/14   Tanna Furry, MD  azithromycin (ZITHROMAX Z-PAK) 250 MG tablet Take 1 tablet (250 mg total) by mouth daily. 08/02/14   Tanna Furry, MD  chlorpheniramine-HYDROcodone [facility] PENNKINETIC ER) 10-8 MG/5ML LQCR Take 5 mLs by mouth every 12 (twelve) hours. 08/02/14   Tanna Furry, MD  predniSONE (DELTASONE) 20 MG tablet Take 1 tablet (20 mg total) by mouth daily with breakfast. 08/02/14   Tanna Furry, MD   BP 187/76 mmHg  Pulse 90  Temp(Src) 99.4 F (37.4 C) (  Oral)  Resp 18  Ht 5\' 7"  (1.702 m)  Wt 162 lb (73.483 kg)  BMI 25.37 kg/m2  SpO2 95% Physical Exam  Constitutional: She is oriented to person, place, and time. She appears well-developed and well-nourished. No distress.  HENT:  Head: Normocephalic.  Eyes: Conjunctivae are normal. Pupils are equal, round, and reactive to light. No scleral icterus.  Neck: Normal range of motion. Neck supple. No thyromegaly present.  Cardiovascular: Normal rate and regular  rhythm.  Exam reveals no gallop and no friction rub.   No murmur heard. Pulmonary/Chest: Effort normal. No respiratory distress. She has wheezes. She has no rales.  Diffuse scattered rhonchi. The basilar dependent breath sounds. No peripheral edema. No S3 or 4 gallop.  Abdominal: Soft. Bowel sounds are normal. She exhibits no distension. There is no tenderness. There is no rebound.  Musculoskeletal: Normal range of motion.  Neurological: She is alert and oriented to person, place, and time.  Skin: Skin is warm and dry. No rash noted.  Psychiatric: She has a normal mood and affect. Her behavior is normal.    ED Course  Procedures (including critical care time) Labs Review Labs Reviewed  BASIC METABOLIC PANEL - Abnormal; Notable for the following:    Glucose, Bld 267 (*)    BUN 31 (*)    Creatinine, Ser 7.32 (*)    GFR calc non Af Amer 5 (*)    GFR calc Af Amer 6 (*)    All other components within normal limits  CBC WITH DIFFERENTIAL/PLATELET - Abnormal; Notable for the following:    RBC 3.60 (*)    Hemoglobin 11.0 (*)    HCT 34.1 (*)    Platelets 133 (*)    Neutrophils Relative % 81 (*)    Neutro Abs 8.4 (*)    Lymphocytes Relative 7 (*)    Monocytes Absolute 1.3 (*)    All other components within normal limits  CBG MONITORING, ED - Abnormal; Notable for the following:    Glucose-Capillary 266 (*)    All other components within normal limits    Imaging Review Dg Chest 2 View  08/02/2014   CLINICAL DATA:  Cough and shortness of breath for 1 day  EXAM: CHEST  2 VIEW  COMPARISON:  None.  FINDINGS: Cardiac shadow is within normal limits. A loop recorder is noted over the anterior chest. The lungs are well aerated bilaterally without focal infiltrate or sizable effusion. No acute bony abnormality is seen.  IMPRESSION: No active cardiopulmonary disease.   Electronically Signed   By: Inez Catalina M.D.   On: 08/02/2014 10:47     EKG Interpretation None      MDM   Final  diagnoses:  Cough  Bronchitis    Patient is not clinically or radiographically show signs of fluid overload. Not hyperkalemic. Clears after nebulized albuterol or cough is improved. Think she is appropriate for outpatient treatment. We've contacted her dialysis center to arrange heard outside this afternoon or first thing in the morning.    Tanna Furry, MD 08/02/14 [phone]

## 2014-08-02 NOTE — Discharge Instructions (Signed)
Cough, Adult  A cough is a reflex that helps clear your throat and airways. It can help heal the body or may be a reaction to an irritated airway. A cough may only last 2 or 3 weeks (acute) or may last more than 8 weeks (chronic).  CAUSES Acute cough:  Viral or bacterial infections. Chronic cough:  Infections.  Allergies.  Asthma.  Post-nasal drip.  Smoking.  Heartburn or acid reflux.  Some medicines.  Chronic lung problems (COPD).  Cancer. SYMPTOMS   Cough.  Fever.  Chest pain.  Increased breathing rate.  High-pitched whistling sound when breathing (wheezing).  Colored mucus that you cough up (sputum). TREATMENT   A bacterial cough may be treated with antibiotic medicine.  A viral cough must run its course and will not respond to antibiotics.  Your caregiver may recommend other treatments if you have a chronic cough. HOME CARE INSTRUCTIONS   Only take over-the-counter or prescription medicines for pain, discomfort, or fever as directed by your caregiver. Use cough suppressants only as directed by your caregiver.  Use a cold steam vaporizer or humidifier in your bedroom or home to help loosen secretions.  Sleep in a semi-upright position if your cough is worse at night.  Rest as needed.  Stop smoking if you smoke. SEEK IMMEDIATE MEDICAL CARE IF:   You have pus in your sputum.  Your cough starts to worsen.  You cannot control your cough with suppressants and are losing sleep.  You begin coughing up blood.  You have difficulty breathing.  You develop pain which is getting worse or is uncontrolled with medicine.  You have a fever. MAKE SURE YOU:   Understand these instructions.  Will watch your condition.  Will get help right away if you are not doing well or get worse. Document Released: 11/29/2010 Document Revised: 08/25/2011 Document Reviewed: 11/29/2010 Exit[employer] Patient Information 2015 [employer]. This information is not intended  to replace advice given to you by your health care provider. Make sure you discuss any questions you have with your health care provider.  

## 2014-08-02 NOTE — ED Notes (Signed)
Patient states she is a dialysis patient that goes to dialysis [facility], W, F.   Patient last went on Monday, but "was too sick to go today".  Patient states started getting cough and SOB yesterday.   Patient complains of productive cough "that makes me throw up".

## 2014-08-02 NOTE — ED Notes (Signed)
Pt scheduled for dialysis tomorrow at [facility].

## 2014-08-03 DIAGNOSIS — N186 End stage renal disease: Secondary | ICD-10-CM | POA: Diagnosis not present

## 2014-08-03 DIAGNOSIS — D631 Anemia in chronic kidney disease: Secondary | ICD-10-CM | POA: Diagnosis not present

## 2014-08-03 DIAGNOSIS — Z992 Dependence on renal dialysis: Secondary | ICD-10-CM | POA: Diagnosis not present

## 2014-08-03 DIAGNOSIS — N2581 Secondary hyperparathyroidism of renal origin: Secondary | ICD-10-CM | POA: Diagnosis not present

## 2014-08-03 DIAGNOSIS — D509 Iron deficiency anemia, unspecified: Secondary | ICD-10-CM | POA: Diagnosis not present

## 2014-08-04 DIAGNOSIS — N186 End stage renal disease: Secondary | ICD-10-CM | POA: Diagnosis not present

## 2014-08-04 DIAGNOSIS — Z992 Dependence on renal dialysis: Secondary | ICD-10-CM | POA: Diagnosis not present

## 2014-08-04 DIAGNOSIS — D509 Iron deficiency anemia, unspecified: Secondary | ICD-10-CM | POA: Diagnosis not present

## 2014-08-04 DIAGNOSIS — N2581 Secondary hyperparathyroidism of renal origin: Secondary | ICD-10-CM | POA: Diagnosis not present

## 2014-08-04 DIAGNOSIS — D631 Anemia in chronic kidney disease: Secondary | ICD-10-CM | POA: Diagnosis not present

## 2014-08-07 DIAGNOSIS — D631 Anemia in chronic kidney disease: Secondary | ICD-10-CM | POA: Diagnosis not present

## 2014-08-07 DIAGNOSIS — D509 Iron deficiency anemia, unspecified: Secondary | ICD-10-CM | POA: Diagnosis not present

## 2014-08-07 DIAGNOSIS — N2581 Secondary hyperparathyroidism of renal origin: Secondary | ICD-10-CM | POA: Diagnosis not present

## 2014-08-07 DIAGNOSIS — Z992 Dependence on renal dialysis: Secondary | ICD-10-CM | POA: Diagnosis not present

## 2014-08-07 DIAGNOSIS — N186 End stage renal disease: Secondary | ICD-10-CM | POA: Diagnosis not present

## 2014-08-09 DIAGNOSIS — N186 End stage renal disease: Secondary | ICD-10-CM | POA: Diagnosis not present

## 2014-08-09 DIAGNOSIS — N2581 Secondary hyperparathyroidism of renal origin: Secondary | ICD-10-CM | POA: Diagnosis not present

## 2014-08-09 DIAGNOSIS — Z992 Dependence on renal dialysis: Secondary | ICD-10-CM | POA: Diagnosis not present

## 2014-08-09 DIAGNOSIS — D631 Anemia in chronic kidney disease: Secondary | ICD-10-CM | POA: Diagnosis not present

## 2014-08-09 DIAGNOSIS — D509 Iron deficiency anemia, unspecified: Secondary | ICD-10-CM | POA: Diagnosis not present

## 2014-08-11 DIAGNOSIS — N2581 Secondary hyperparathyroidism of renal origin: Secondary | ICD-10-CM | POA: Diagnosis not present

## 2014-08-11 DIAGNOSIS — D509 Iron deficiency anemia, unspecified: Secondary | ICD-10-CM | POA: Diagnosis not present

## 2014-08-11 DIAGNOSIS — D631 Anemia in chronic kidney disease: Secondary | ICD-10-CM | POA: Diagnosis not present

## 2014-08-11 DIAGNOSIS — N186 End stage renal disease: Secondary | ICD-10-CM | POA: Diagnosis not present

## 2014-08-11 DIAGNOSIS — Z992 Dependence on renal dialysis: Secondary | ICD-10-CM | POA: Diagnosis not present

## 2014-08-14 DIAGNOSIS — Z992 Dependence on renal dialysis: Secondary | ICD-10-CM | POA: Diagnosis not present

## 2014-08-14 DIAGNOSIS — D631 Anemia in chronic kidney disease: Secondary | ICD-10-CM | POA: Diagnosis not present

## 2014-08-14 DIAGNOSIS — N2581 Secondary hyperparathyroidism of renal origin: Secondary | ICD-10-CM | POA: Diagnosis not present

## 2014-08-14 DIAGNOSIS — D509 Iron deficiency anemia, unspecified: Secondary | ICD-10-CM | POA: Diagnosis not present

## 2014-08-14 DIAGNOSIS — N186 End stage renal disease: Secondary | ICD-10-CM | POA: Diagnosis not present

## 2014-08-15 ENCOUNTER — Other Ambulatory Visit (HOSPITAL_COMMUNITY): Payer: Self-pay | Admitting: Cardiology

## 2014-08-15 DIAGNOSIS — R0789 Other chest pain: Secondary | ICD-10-CM | POA: Diagnosis not present

## 2014-08-15 DIAGNOSIS — N186 End stage renal disease: Secondary | ICD-10-CM | POA: Diagnosis not present

## 2014-08-15 DIAGNOSIS — E780 Pure hypercholesterolemia: Secondary | ICD-10-CM | POA: Diagnosis not present

## 2014-08-15 DIAGNOSIS — D510 Vitamin B12 deficiency anemia due to intrinsic factor deficiency: Secondary | ICD-10-CM | POA: Diagnosis not present

## 2014-08-15 DIAGNOSIS — R0602 Shortness of breath: Secondary | ICD-10-CM | POA: Diagnosis not present

## 2014-08-16 DIAGNOSIS — D631 Anemia in chronic kidney disease: Secondary | ICD-10-CM | POA: Diagnosis not present

## 2014-08-16 DIAGNOSIS — D509 Iron deficiency anemia, unspecified: Secondary | ICD-10-CM | POA: Diagnosis not present

## 2014-08-16 DIAGNOSIS — N2581 Secondary hyperparathyroidism of renal origin: Secondary | ICD-10-CM | POA: Diagnosis not present

## 2014-08-16 DIAGNOSIS — N186 End stage renal disease: Secondary | ICD-10-CM | POA: Diagnosis not present

## 2014-08-16 DIAGNOSIS — Z992 Dependence on renal dialysis: Secondary | ICD-10-CM | POA: Diagnosis not present

## 2014-08-17 ENCOUNTER — Other Ambulatory Visit: Payer: Self-pay | Admitting: *Deleted

## 2014-08-17 MED ORDER — RENA-VITE PO TABS: 1.0000 | ORAL_TABLET | Freq: Every day | ORAL | Status: DC

## 2014-08-18 DIAGNOSIS — D509 Iron deficiency anemia, unspecified: Secondary | ICD-10-CM | POA: Diagnosis not present

## 2014-08-18 DIAGNOSIS — N186 End stage renal disease: Secondary | ICD-10-CM | POA: Diagnosis not present

## 2014-08-18 DIAGNOSIS — Z992 Dependence on renal dialysis: Secondary | ICD-10-CM | POA: Diagnosis not present

## 2014-08-18 DIAGNOSIS — D631 Anemia in chronic kidney disease: Secondary | ICD-10-CM | POA: Diagnosis not present

## 2014-08-18 DIAGNOSIS — N2581 Secondary hyperparathyroidism of renal origin: Secondary | ICD-10-CM | POA: Diagnosis not present

## 2014-08-21 ENCOUNTER — Other Ambulatory Visit: Payer: Self-pay | Admitting: *Deleted

## 2014-08-21 ENCOUNTER — Encounter: Payer: Self-pay | Admitting: Internal Medicine

## 2014-08-21 DIAGNOSIS — N186 End stage renal disease: Secondary | ICD-10-CM | POA: Diagnosis not present

## 2014-08-21 DIAGNOSIS — Z992 Dependence on renal dialysis: Secondary | ICD-10-CM | POA: Diagnosis not present

## 2014-08-21 DIAGNOSIS — D631 Anemia in chronic kidney disease: Secondary | ICD-10-CM | POA: Diagnosis not present

## 2014-08-21 DIAGNOSIS — N2581 Secondary hyperparathyroidism of renal origin: Secondary | ICD-10-CM | POA: Diagnosis not present

## 2014-08-21 DIAGNOSIS — D509 Iron deficiency anemia, unspecified: Secondary | ICD-10-CM | POA: Diagnosis not present

## 2014-08-21 MED ORDER — RENA-VITE PO TABS: 1.0000 | ORAL_TABLET | Freq: Every day | ORAL | Status: DC

## 2014-08-23 ENCOUNTER — Ambulatory Visit (INDEPENDENT_AMBULATORY_CARE_PROVIDER_SITE_OTHER): Payer: Medicare Other | Admitting: *Deleted

## 2014-08-23 DIAGNOSIS — I639 Cerebral infarction, unspecified: Secondary | ICD-10-CM

## 2014-08-23 DIAGNOSIS — D509 Iron deficiency anemia, unspecified: Secondary | ICD-10-CM | POA: Diagnosis not present

## 2014-08-23 DIAGNOSIS — D631 Anemia in chronic kidney disease: Secondary | ICD-10-CM | POA: Diagnosis not present

## 2014-08-23 DIAGNOSIS — N186 End stage renal disease: Secondary | ICD-10-CM | POA: Diagnosis not present

## 2014-08-23 DIAGNOSIS — N2581 Secondary hyperparathyroidism of renal origin: Secondary | ICD-10-CM | POA: Diagnosis not present

## 2014-08-23 DIAGNOSIS — Z992 Dependence on renal dialysis: Secondary | ICD-10-CM | POA: Diagnosis not present

## 2014-08-24 NOTE — Progress Notes (Signed)
Loop recorder 

## 2014-08-25 DIAGNOSIS — Z992 Dependence on renal dialysis: Secondary | ICD-10-CM | POA: Diagnosis not present

## 2014-08-25 DIAGNOSIS — D509 Iron deficiency anemia, unspecified: Secondary | ICD-10-CM | POA: Diagnosis not present

## 2014-08-25 DIAGNOSIS — D631 Anemia in chronic kidney disease: Secondary | ICD-10-CM | POA: Diagnosis not present

## 2014-08-25 DIAGNOSIS — N186 End stage renal disease: Secondary | ICD-10-CM | POA: Diagnosis not present

## 2014-08-25 DIAGNOSIS — N2581 Secondary hyperparathyroidism of renal origin: Secondary | ICD-10-CM | POA: Diagnosis not present

## 2014-08-28 DIAGNOSIS — R0789 Other chest pain: Secondary | ICD-10-CM | POA: Diagnosis not present

## 2014-08-29 DIAGNOSIS — Z992 Dependence on renal dialysis: Secondary | ICD-10-CM | POA: Diagnosis not present

## 2014-08-29 DIAGNOSIS — N186 End stage renal disease: Secondary | ICD-10-CM | POA: Diagnosis not present

## 2014-08-29 DIAGNOSIS — D509 Iron deficiency anemia, unspecified: Secondary | ICD-10-CM | POA: Diagnosis not present

## 2014-08-29 DIAGNOSIS — N2581 Secondary hyperparathyroidism of renal origin: Secondary | ICD-10-CM | POA: Diagnosis not present

## 2014-08-29 DIAGNOSIS — D631 Anemia in chronic kidney disease: Secondary | ICD-10-CM | POA: Diagnosis not present

## 2014-08-30 DIAGNOSIS — Z992 Dependence on renal dialysis: Secondary | ICD-10-CM | POA: Diagnosis not present

## 2014-08-30 DIAGNOSIS — N2581 Secondary hyperparathyroidism of renal origin: Secondary | ICD-10-CM | POA: Diagnosis not present

## 2014-08-30 DIAGNOSIS — D509 Iron deficiency anemia, unspecified: Secondary | ICD-10-CM | POA: Diagnosis not present

## 2014-08-30 DIAGNOSIS — N186 End stage renal disease: Secondary | ICD-10-CM | POA: Diagnosis not present

## 2014-08-30 DIAGNOSIS — D631 Anemia in chronic kidney disease: Secondary | ICD-10-CM | POA: Diagnosis not present

## 2014-09-01 DIAGNOSIS — D631 Anemia in chronic kidney disease: Secondary | ICD-10-CM | POA: Diagnosis not present

## 2014-09-01 DIAGNOSIS — N2581 Secondary hyperparathyroidism of renal origin: Secondary | ICD-10-CM | POA: Diagnosis not present

## 2014-09-01 DIAGNOSIS — N186 End stage renal disease: Secondary | ICD-10-CM | POA: Diagnosis not present

## 2014-09-01 DIAGNOSIS — D509 Iron deficiency anemia, unspecified: Secondary | ICD-10-CM | POA: Diagnosis not present

## 2014-09-01 DIAGNOSIS — Z992 Dependence on renal dialysis: Secondary | ICD-10-CM | POA: Diagnosis not present

## 2014-09-04 DIAGNOSIS — N2581 Secondary hyperparathyroidism of renal origin: Secondary | ICD-10-CM | POA: Diagnosis not present

## 2014-09-04 DIAGNOSIS — N186 End stage renal disease: Secondary | ICD-10-CM | POA: Diagnosis not present

## 2014-09-04 DIAGNOSIS — D509 Iron deficiency anemia, unspecified: Secondary | ICD-10-CM | POA: Diagnosis not present

## 2014-09-04 DIAGNOSIS — D631 Anemia in chronic kidney disease: Secondary | ICD-10-CM | POA: Diagnosis not present

## 2014-09-04 DIAGNOSIS — Z992 Dependence on renal dialysis: Secondary | ICD-10-CM | POA: Diagnosis not present

## 2014-09-06 DIAGNOSIS — D509 Iron deficiency anemia, unspecified: Secondary | ICD-10-CM | POA: Diagnosis not present

## 2014-09-06 DIAGNOSIS — D631 Anemia in chronic kidney disease: Secondary | ICD-10-CM | POA: Diagnosis not present

## 2014-09-06 DIAGNOSIS — N186 End stage renal disease: Secondary | ICD-10-CM | POA: Diagnosis not present

## 2014-09-06 DIAGNOSIS — Z992 Dependence on renal dialysis: Secondary | ICD-10-CM | POA: Diagnosis not present

## 2014-09-06 DIAGNOSIS — N2581 Secondary hyperparathyroidism of renal origin: Secondary | ICD-10-CM | POA: Diagnosis not present

## 2014-09-06 LAB — MDC_IDC_ENUM_SESS_TYPE_REMOTE

## 2014-09-07 DIAGNOSIS — R0609 Other forms of dyspnea: Secondary | ICD-10-CM | POA: Diagnosis not present

## 2014-09-08 DIAGNOSIS — N2581 Secondary hyperparathyroidism of renal origin: Secondary | ICD-10-CM | POA: Diagnosis not present

## 2014-09-08 DIAGNOSIS — D631 Anemia in chronic kidney disease: Secondary | ICD-10-CM | POA: Diagnosis not present

## 2014-09-08 DIAGNOSIS — D509 Iron deficiency anemia, unspecified: Secondary | ICD-10-CM | POA: Diagnosis not present

## 2014-09-08 DIAGNOSIS — Z992 Dependence on renal dialysis: Secondary | ICD-10-CM | POA: Diagnosis not present

## 2014-09-08 DIAGNOSIS — N186 End stage renal disease: Secondary | ICD-10-CM | POA: Diagnosis not present

## 2014-09-11 DIAGNOSIS — N186 End stage renal disease: Secondary | ICD-10-CM | POA: Diagnosis not present

## 2014-09-11 DIAGNOSIS — D631 Anemia in chronic kidney disease: Secondary | ICD-10-CM | POA: Diagnosis not present

## 2014-09-11 DIAGNOSIS — N2581 Secondary hyperparathyroidism of renal origin: Secondary | ICD-10-CM | POA: Diagnosis not present

## 2014-09-11 DIAGNOSIS — D509 Iron deficiency anemia, unspecified: Secondary | ICD-10-CM | POA: Diagnosis not present

## 2014-09-11 DIAGNOSIS — Z992 Dependence on renal dialysis: Secondary | ICD-10-CM | POA: Diagnosis not present

## 2014-09-12 DIAGNOSIS — D510 Vitamin B12 deficiency anemia due to intrinsic factor deficiency: Secondary | ICD-10-CM | POA: Diagnosis not present

## 2014-09-13 DIAGNOSIS — N186 End stage renal disease: Secondary | ICD-10-CM | POA: Diagnosis not present

## 2014-09-13 DIAGNOSIS — D631 Anemia in chronic kidney disease: Secondary | ICD-10-CM | POA: Diagnosis not present

## 2014-09-13 DIAGNOSIS — Z992 Dependence on renal dialysis: Secondary | ICD-10-CM | POA: Diagnosis not present

## 2014-09-13 DIAGNOSIS — D509 Iron deficiency anemia, unspecified: Secondary | ICD-10-CM | POA: Diagnosis not present

## 2014-09-13 DIAGNOSIS — N2581 Secondary hyperparathyroidism of renal origin: Secondary | ICD-10-CM | POA: Diagnosis not present

## 2014-09-14 DIAGNOSIS — Z992 Dependence on renal dialysis: Secondary | ICD-10-CM | POA: Diagnosis not present

## 2014-09-14 DIAGNOSIS — N186 End stage renal disease: Secondary | ICD-10-CM | POA: Diagnosis not present

## 2014-09-15 DIAGNOSIS — D509 Iron deficiency anemia, unspecified: Secondary | ICD-10-CM | POA: Diagnosis not present

## 2014-09-15 DIAGNOSIS — N186 End stage renal disease: Secondary | ICD-10-CM | POA: Diagnosis not present

## 2014-09-15 DIAGNOSIS — Z992 Dependence on renal dialysis: Secondary | ICD-10-CM | POA: Diagnosis not present

## 2014-09-15 DIAGNOSIS — D631 Anemia in chronic kidney disease: Secondary | ICD-10-CM | POA: Diagnosis not present

## 2014-09-15 DIAGNOSIS — N2581 Secondary hyperparathyroidism of renal origin: Secondary | ICD-10-CM | POA: Diagnosis not present

## 2014-09-18 DIAGNOSIS — N186 End stage renal disease: Secondary | ICD-10-CM | POA: Diagnosis not present

## 2014-09-18 DIAGNOSIS — D631 Anemia in chronic kidney disease: Secondary | ICD-10-CM | POA: Diagnosis not present

## 2014-09-18 DIAGNOSIS — N2581 Secondary hyperparathyroidism of renal origin: Secondary | ICD-10-CM | POA: Diagnosis not present

## 2014-09-18 DIAGNOSIS — Z992 Dependence on renal dialysis: Secondary | ICD-10-CM | POA: Diagnosis not present

## 2014-09-18 DIAGNOSIS — D509 Iron deficiency anemia, unspecified: Secondary | ICD-10-CM | POA: Diagnosis not present

## 2014-09-19 DIAGNOSIS — I129 Hypertensive chronic kidney disease with stage 1 through stage 4 chronic kidney disease, or unspecified chronic kidney disease: Secondary | ICD-10-CM | POA: Diagnosis not present

## 2014-09-19 DIAGNOSIS — N189 Chronic kidney disease, unspecified: Secondary | ICD-10-CM | POA: Diagnosis not present

## 2014-09-19 DIAGNOSIS — E780 Pure hypercholesterolemia: Secondary | ICD-10-CM | POA: Diagnosis not present

## 2014-09-19 DIAGNOSIS — I119 Hypertensive heart disease without heart failure: Secondary | ICD-10-CM | POA: Diagnosis not present

## 2014-09-20 DIAGNOSIS — D631 Anemia in chronic kidney disease: Secondary | ICD-10-CM | POA: Diagnosis not present

## 2014-09-20 DIAGNOSIS — N186 End stage renal disease: Secondary | ICD-10-CM | POA: Diagnosis not present

## 2014-09-20 DIAGNOSIS — N2581 Secondary hyperparathyroidism of renal origin: Secondary | ICD-10-CM | POA: Diagnosis not present

## 2014-09-20 DIAGNOSIS — D509 Iron deficiency anemia, unspecified: Secondary | ICD-10-CM | POA: Diagnosis not present

## 2014-09-20 DIAGNOSIS — Z992 Dependence on renal dialysis: Secondary | ICD-10-CM | POA: Diagnosis not present

## 2014-09-22 ENCOUNTER — Ambulatory Visit (INDEPENDENT_AMBULATORY_CARE_PROVIDER_SITE_OTHER): Payer: Medicare Other | Admitting: *Deleted

## 2014-09-22 DIAGNOSIS — I639 Cerebral infarction, unspecified: Secondary | ICD-10-CM

## 2014-09-22 DIAGNOSIS — D631 Anemia in chronic kidney disease: Secondary | ICD-10-CM | POA: Diagnosis not present

## 2014-09-22 DIAGNOSIS — D509 Iron deficiency anemia, unspecified: Secondary | ICD-10-CM | POA: Diagnosis not present

## 2014-09-22 DIAGNOSIS — N2581 Secondary hyperparathyroidism of renal origin: Secondary | ICD-10-CM | POA: Diagnosis not present

## 2014-09-22 DIAGNOSIS — N186 End stage renal disease: Secondary | ICD-10-CM | POA: Diagnosis not present

## 2014-09-22 DIAGNOSIS — Z992 Dependence on renal dialysis: Secondary | ICD-10-CM | POA: Diagnosis not present

## 2014-09-25 DIAGNOSIS — D509 Iron deficiency anemia, unspecified: Secondary | ICD-10-CM | POA: Diagnosis not present

## 2014-09-25 DIAGNOSIS — Z992 Dependence on renal dialysis: Secondary | ICD-10-CM | POA: Diagnosis not present

## 2014-09-25 DIAGNOSIS — D631 Anemia in chronic kidney disease: Secondary | ICD-10-CM | POA: Diagnosis not present

## 2014-09-25 DIAGNOSIS — N186 End stage renal disease: Secondary | ICD-10-CM | POA: Diagnosis not present

## 2014-09-25 DIAGNOSIS — N2581 Secondary hyperparathyroidism of renal origin: Secondary | ICD-10-CM | POA: Diagnosis not present

## 2014-09-26 NOTE — Progress Notes (Signed)
Loop recorder 

## 2014-09-27 DIAGNOSIS — D631 Anemia in chronic kidney disease: Secondary | ICD-10-CM | POA: Diagnosis not present

## 2014-09-27 DIAGNOSIS — D509 Iron deficiency anemia, unspecified: Secondary | ICD-10-CM | POA: Diagnosis not present

## 2014-09-27 DIAGNOSIS — N186 End stage renal disease: Secondary | ICD-10-CM | POA: Diagnosis not present

## 2014-09-27 DIAGNOSIS — N2581 Secondary hyperparathyroidism of renal origin: Secondary | ICD-10-CM | POA: Diagnosis not present

## 2014-09-27 DIAGNOSIS — Z992 Dependence on renal dialysis: Secondary | ICD-10-CM | POA: Diagnosis not present

## 2014-09-29 ENCOUNTER — Encounter: Payer: Self-pay | Admitting: Internal Medicine

## 2014-09-29 DIAGNOSIS — K219 Gastro-esophageal reflux disease without esophagitis: Secondary | ICD-10-CM | POA: Insufficient documentation

## 2014-09-29 DIAGNOSIS — Z992 Dependence on renal dialysis: Secondary | ICD-10-CM | POA: Diagnosis not present

## 2014-09-29 DIAGNOSIS — D631 Anemia in chronic kidney disease: Secondary | ICD-10-CM | POA: Diagnosis not present

## 2014-09-29 DIAGNOSIS — N186 End stage renal disease: Secondary | ICD-10-CM | POA: Diagnosis not present

## 2014-09-29 DIAGNOSIS — D509 Iron deficiency anemia, unspecified: Secondary | ICD-10-CM | POA: Diagnosis not present

## 2014-09-29 DIAGNOSIS — N2581 Secondary hyperparathyroidism of renal origin: Secondary | ICD-10-CM | POA: Diagnosis not present

## 2014-10-02 DIAGNOSIS — N2581 Secondary hyperparathyroidism of renal origin: Secondary | ICD-10-CM | POA: Diagnosis not present

## 2014-10-02 DIAGNOSIS — N186 End stage renal disease: Secondary | ICD-10-CM | POA: Diagnosis not present

## 2014-10-02 DIAGNOSIS — D509 Iron deficiency anemia, unspecified: Secondary | ICD-10-CM | POA: Diagnosis not present

## 2014-10-02 DIAGNOSIS — D631 Anemia in chronic kidney disease: Secondary | ICD-10-CM | POA: Diagnosis not present

## 2014-10-02 DIAGNOSIS — Z992 Dependence on renal dialysis: Secondary | ICD-10-CM | POA: Diagnosis not present

## 2014-10-04 DIAGNOSIS — D509 Iron deficiency anemia, unspecified: Secondary | ICD-10-CM | POA: Diagnosis not present

## 2014-10-04 DIAGNOSIS — N186 End stage renal disease: Secondary | ICD-10-CM | POA: Diagnosis not present

## 2014-10-04 DIAGNOSIS — D631 Anemia in chronic kidney disease: Secondary | ICD-10-CM | POA: Diagnosis not present

## 2014-10-04 DIAGNOSIS — N2581 Secondary hyperparathyroidism of renal origin: Secondary | ICD-10-CM | POA: Diagnosis not present

## 2014-10-04 DIAGNOSIS — Z992 Dependence on renal dialysis: Secondary | ICD-10-CM | POA: Diagnosis not present

## 2014-10-06 DIAGNOSIS — N2581 Secondary hyperparathyroidism of renal origin: Secondary | ICD-10-CM | POA: Diagnosis not present

## 2014-10-06 DIAGNOSIS — Z992 Dependence on renal dialysis: Secondary | ICD-10-CM | POA: Diagnosis not present

## 2014-10-06 DIAGNOSIS — N186 End stage renal disease: Secondary | ICD-10-CM | POA: Diagnosis not present

## 2014-10-06 DIAGNOSIS — D509 Iron deficiency anemia, unspecified: Secondary | ICD-10-CM | POA: Diagnosis not present

## 2014-10-06 DIAGNOSIS — D631 Anemia in chronic kidney disease: Secondary | ICD-10-CM | POA: Diagnosis not present

## 2014-10-09 DIAGNOSIS — D631 Anemia in chronic kidney disease: Secondary | ICD-10-CM | POA: Diagnosis not present

## 2014-10-09 DIAGNOSIS — N2581 Secondary hyperparathyroidism of renal origin: Secondary | ICD-10-CM | POA: Diagnosis not present

## 2014-10-09 DIAGNOSIS — Z992 Dependence on renal dialysis: Secondary | ICD-10-CM | POA: Diagnosis not present

## 2014-10-09 DIAGNOSIS — D509 Iron deficiency anemia, unspecified: Secondary | ICD-10-CM | POA: Diagnosis not present

## 2014-10-09 DIAGNOSIS — N186 End stage renal disease: Secondary | ICD-10-CM | POA: Diagnosis not present

## 2014-10-10 DIAGNOSIS — D510 Vitamin B12 deficiency anemia due to intrinsic factor deficiency: Secondary | ICD-10-CM | POA: Diagnosis not present

## 2014-10-10 DIAGNOSIS — N186 End stage renal disease: Secondary | ICD-10-CM | POA: Diagnosis not present

## 2014-10-10 DIAGNOSIS — E1140 Type 2 diabetes mellitus with diabetic neuropathy, unspecified: Secondary | ICD-10-CM | POA: Diagnosis not present

## 2014-10-11 DIAGNOSIS — D509 Iron deficiency anemia, unspecified: Secondary | ICD-10-CM | POA: Diagnosis not present

## 2014-10-11 DIAGNOSIS — Z992 Dependence on renal dialysis: Secondary | ICD-10-CM | POA: Diagnosis not present

## 2014-10-11 DIAGNOSIS — N186 End stage renal disease: Secondary | ICD-10-CM | POA: Diagnosis not present

## 2014-10-11 DIAGNOSIS — N2581 Secondary hyperparathyroidism of renal origin: Secondary | ICD-10-CM | POA: Diagnosis not present

## 2014-10-11 DIAGNOSIS — D631 Anemia in chronic kidney disease: Secondary | ICD-10-CM | POA: Diagnosis not present

## 2014-10-13 DIAGNOSIS — D631 Anemia in chronic kidney disease: Secondary | ICD-10-CM | POA: Diagnosis not present

## 2014-10-13 DIAGNOSIS — Z992 Dependence on renal dialysis: Secondary | ICD-10-CM | POA: Diagnosis not present

## 2014-10-13 DIAGNOSIS — D509 Iron deficiency anemia, unspecified: Secondary | ICD-10-CM | POA: Diagnosis not present

## 2014-10-13 DIAGNOSIS — N186 End stage renal disease: Secondary | ICD-10-CM | POA: Diagnosis not present

## 2014-10-13 DIAGNOSIS — N2581 Secondary hyperparathyroidism of renal origin: Secondary | ICD-10-CM | POA: Diagnosis not present

## 2014-10-14 DIAGNOSIS — Z992 Dependence on renal dialysis: Secondary | ICD-10-CM | POA: Diagnosis not present

## 2014-10-14 DIAGNOSIS — N186 End stage renal disease: Secondary | ICD-10-CM | POA: Diagnosis not present

## 2014-10-16 DIAGNOSIS — N186 End stage renal disease: Secondary | ICD-10-CM | POA: Diagnosis not present

## 2014-10-16 DIAGNOSIS — D509 Iron deficiency anemia, unspecified: Secondary | ICD-10-CM | POA: Diagnosis not present

## 2014-10-16 DIAGNOSIS — Z992 Dependence on renal dialysis: Secondary | ICD-10-CM | POA: Diagnosis not present

## 2014-10-16 DIAGNOSIS — D631 Anemia in chronic kidney disease: Secondary | ICD-10-CM | POA: Diagnosis not present

## 2014-10-16 DIAGNOSIS — N2581 Secondary hyperparathyroidism of renal origin: Secondary | ICD-10-CM | POA: Diagnosis not present

## 2014-10-18 DIAGNOSIS — D631 Anemia in chronic kidney disease: Secondary | ICD-10-CM | POA: Diagnosis not present

## 2014-10-18 DIAGNOSIS — D509 Iron deficiency anemia, unspecified: Secondary | ICD-10-CM | POA: Diagnosis not present

## 2014-10-18 DIAGNOSIS — Z992 Dependence on renal dialysis: Secondary | ICD-10-CM | POA: Diagnosis not present

## 2014-10-18 DIAGNOSIS — N186 End stage renal disease: Secondary | ICD-10-CM | POA: Diagnosis not present

## 2014-10-18 DIAGNOSIS — N2581 Secondary hyperparathyroidism of renal origin: Secondary | ICD-10-CM | POA: Diagnosis not present

## 2014-10-20 DIAGNOSIS — N2581 Secondary hyperparathyroidism of renal origin: Secondary | ICD-10-CM | POA: Diagnosis not present

## 2014-10-20 DIAGNOSIS — Z992 Dependence on renal dialysis: Secondary | ICD-10-CM | POA: Diagnosis not present

## 2014-10-20 DIAGNOSIS — D509 Iron deficiency anemia, unspecified: Secondary | ICD-10-CM | POA: Diagnosis not present

## 2014-10-20 DIAGNOSIS — D631 Anemia in chronic kidney disease: Secondary | ICD-10-CM | POA: Diagnosis not present

## 2014-10-20 DIAGNOSIS — N186 End stage renal disease: Secondary | ICD-10-CM | POA: Diagnosis not present

## 2014-10-23 ENCOUNTER — Ambulatory Visit (INDEPENDENT_AMBULATORY_CARE_PROVIDER_SITE_OTHER): Payer: Medicare Other | Admitting: *Deleted

## 2014-10-23 DIAGNOSIS — N2581 Secondary hyperparathyroidism of renal origin: Secondary | ICD-10-CM | POA: Diagnosis not present

## 2014-10-23 DIAGNOSIS — I639 Cerebral infarction, unspecified: Secondary | ICD-10-CM | POA: Diagnosis not present

## 2014-10-23 DIAGNOSIS — D631 Anemia in chronic kidney disease: Secondary | ICD-10-CM | POA: Diagnosis not present

## 2014-10-23 DIAGNOSIS — D509 Iron deficiency anemia, unspecified: Secondary | ICD-10-CM | POA: Diagnosis not present

## 2014-10-23 DIAGNOSIS — Z992 Dependence on renal dialysis: Secondary | ICD-10-CM | POA: Diagnosis not present

## 2014-10-23 DIAGNOSIS — N186 End stage renal disease: Secondary | ICD-10-CM | POA: Diagnosis not present

## 2014-10-23 DIAGNOSIS — E119 Type 2 diabetes mellitus without complications: Secondary | ICD-10-CM | POA: Diagnosis not present

## 2014-10-23 DIAGNOSIS — Z794 Long term (current) use of insulin: Secondary | ICD-10-CM | POA: Diagnosis not present

## 2014-10-24 LAB — CUP PACEART REMOTE DEVICE CHECK: Date Time Interrogation Session: 20160510135231

## 2014-10-25 DIAGNOSIS — N186 End stage renal disease: Secondary | ICD-10-CM | POA: Diagnosis not present

## 2014-10-25 DIAGNOSIS — I739 Peripheral vascular disease, unspecified: Secondary | ICD-10-CM | POA: Diagnosis not present

## 2014-10-25 DIAGNOSIS — D509 Iron deficiency anemia, unspecified: Secondary | ICD-10-CM | POA: Diagnosis not present

## 2014-10-25 DIAGNOSIS — L851 Acquired keratosis [keratoderma] palmaris et plantaris: Secondary | ICD-10-CM | POA: Diagnosis not present

## 2014-10-25 DIAGNOSIS — L609 Nail disorder, unspecified: Secondary | ICD-10-CM | POA: Diagnosis not present

## 2014-10-25 DIAGNOSIS — M79671 Pain in right foot: Secondary | ICD-10-CM | POA: Diagnosis not present

## 2014-10-25 DIAGNOSIS — N2581 Secondary hyperparathyroidism of renal origin: Secondary | ICD-10-CM | POA: Diagnosis not present

## 2014-10-25 DIAGNOSIS — D631 Anemia in chronic kidney disease: Secondary | ICD-10-CM | POA: Diagnosis not present

## 2014-10-25 DIAGNOSIS — Z992 Dependence on renal dialysis: Secondary | ICD-10-CM | POA: Diagnosis not present

## 2014-10-25 DIAGNOSIS — B351 Tinea unguium: Secondary | ICD-10-CM | POA: Diagnosis not present

## 2014-10-27 DIAGNOSIS — N2581 Secondary hyperparathyroidism of renal origin: Secondary | ICD-10-CM | POA: Diagnosis not present

## 2014-10-27 DIAGNOSIS — D631 Anemia in chronic kidney disease: Secondary | ICD-10-CM | POA: Diagnosis not present

## 2014-10-27 DIAGNOSIS — N186 End stage renal disease: Secondary | ICD-10-CM | POA: Diagnosis not present

## 2014-10-27 DIAGNOSIS — Z992 Dependence on renal dialysis: Secondary | ICD-10-CM | POA: Diagnosis not present

## 2014-10-27 DIAGNOSIS — D509 Iron deficiency anemia, unspecified: Secondary | ICD-10-CM | POA: Diagnosis not present

## 2014-10-27 NOTE — Progress Notes (Signed)
Loop recorder 

## 2014-10-30 ENCOUNTER — Other Ambulatory Visit: Payer: Self-pay | Admitting: Physical Medicine & Rehabilitation

## 2014-10-30 DIAGNOSIS — D509 Iron deficiency anemia, unspecified: Secondary | ICD-10-CM | POA: Diagnosis not present

## 2014-10-30 DIAGNOSIS — Z992 Dependence on renal dialysis: Secondary | ICD-10-CM | POA: Diagnosis not present

## 2014-10-30 DIAGNOSIS — N2581 Secondary hyperparathyroidism of renal origin: Secondary | ICD-10-CM | POA: Diagnosis not present

## 2014-10-30 DIAGNOSIS — N186 End stage renal disease: Secondary | ICD-10-CM | POA: Diagnosis not present

## 2014-10-30 DIAGNOSIS — D631 Anemia in chronic kidney disease: Secondary | ICD-10-CM | POA: Diagnosis not present

## 2014-10-31 ENCOUNTER — Encounter (HOSPITAL_COMMUNITY): Payer: Self-pay | Admitting: Internal Medicine

## 2014-10-31 DIAGNOSIS — N186 End stage renal disease: Secondary | ICD-10-CM | POA: Diagnosis not present

## 2014-10-31 DIAGNOSIS — H8149 Vertigo of central origin, unspecified ear: Secondary | ICD-10-CM | POA: Diagnosis not present

## 2014-10-31 DIAGNOSIS — E1122 Type 2 diabetes mellitus with diabetic chronic kidney disease: Secondary | ICD-10-CM | POA: Diagnosis not present

## 2014-11-01 ENCOUNTER — Other Ambulatory Visit: Payer: Self-pay | Admitting: Physical Medicine & Rehabilitation

## 2014-11-01 DIAGNOSIS — N186 End stage renal disease: Secondary | ICD-10-CM | POA: Diagnosis not present

## 2014-11-01 DIAGNOSIS — D631 Anemia in chronic kidney disease: Secondary | ICD-10-CM | POA: Diagnosis not present

## 2014-11-01 DIAGNOSIS — Z992 Dependence on renal dialysis: Secondary | ICD-10-CM | POA: Diagnosis not present

## 2014-11-01 DIAGNOSIS — N2581 Secondary hyperparathyroidism of renal origin: Secondary | ICD-10-CM | POA: Diagnosis not present

## 2014-11-01 DIAGNOSIS — D509 Iron deficiency anemia, unspecified: Secondary | ICD-10-CM | POA: Diagnosis not present

## 2014-11-03 DIAGNOSIS — N2581 Secondary hyperparathyroidism of renal origin: Secondary | ICD-10-CM | POA: Diagnosis not present

## 2014-11-03 DIAGNOSIS — N186 End stage renal disease: Secondary | ICD-10-CM | POA: Diagnosis not present

## 2014-11-03 DIAGNOSIS — D509 Iron deficiency anemia, unspecified: Secondary | ICD-10-CM | POA: Diagnosis not present

## 2014-11-03 DIAGNOSIS — Z992 Dependence on renal dialysis: Secondary | ICD-10-CM | POA: Diagnosis not present

## 2014-11-03 DIAGNOSIS — D631 Anemia in chronic kidney disease: Secondary | ICD-10-CM | POA: Diagnosis not present

## 2014-11-06 DIAGNOSIS — N186 End stage renal disease: Secondary | ICD-10-CM | POA: Diagnosis not present

## 2014-11-06 DIAGNOSIS — Z992 Dependence on renal dialysis: Secondary | ICD-10-CM | POA: Diagnosis not present

## 2014-11-06 DIAGNOSIS — D509 Iron deficiency anemia, unspecified: Secondary | ICD-10-CM | POA: Diagnosis not present

## 2014-11-06 DIAGNOSIS — N2581 Secondary hyperparathyroidism of renal origin: Secondary | ICD-10-CM | POA: Diagnosis not present

## 2014-11-06 DIAGNOSIS — D631 Anemia in chronic kidney disease: Secondary | ICD-10-CM | POA: Diagnosis not present

## 2014-11-07 DIAGNOSIS — N186 End stage renal disease: Secondary | ICD-10-CM | POA: Diagnosis not present

## 2014-11-07 DIAGNOSIS — E1122 Type 2 diabetes mellitus with diabetic chronic kidney disease: Secondary | ICD-10-CM | POA: Diagnosis not present

## 2014-11-07 DIAGNOSIS — D510 Vitamin B12 deficiency anemia due to intrinsic factor deficiency: Secondary | ICD-10-CM | POA: Diagnosis not present

## 2014-11-07 DIAGNOSIS — H8149 Vertigo of central origin, unspecified ear: Secondary | ICD-10-CM | POA: Diagnosis not present

## 2014-11-08 DIAGNOSIS — Z992 Dependence on renal dialysis: Secondary | ICD-10-CM | POA: Diagnosis not present

## 2014-11-08 DIAGNOSIS — D509 Iron deficiency anemia, unspecified: Secondary | ICD-10-CM | POA: Diagnosis not present

## 2014-11-08 DIAGNOSIS — N2581 Secondary hyperparathyroidism of renal origin: Secondary | ICD-10-CM | POA: Diagnosis not present

## 2014-11-08 DIAGNOSIS — D631 Anemia in chronic kidney disease: Secondary | ICD-10-CM | POA: Diagnosis not present

## 2014-11-08 DIAGNOSIS — N186 End stage renal disease: Secondary | ICD-10-CM | POA: Diagnosis not present

## 2014-11-10 DIAGNOSIS — Z992 Dependence on renal dialysis: Secondary | ICD-10-CM | POA: Diagnosis not present

## 2014-11-10 DIAGNOSIS — N186 End stage renal disease: Secondary | ICD-10-CM | POA: Diagnosis not present

## 2014-11-10 DIAGNOSIS — N2581 Secondary hyperparathyroidism of renal origin: Secondary | ICD-10-CM | POA: Diagnosis not present

## 2014-11-10 DIAGNOSIS — D631 Anemia in chronic kidney disease: Secondary | ICD-10-CM | POA: Diagnosis not present

## 2014-11-10 DIAGNOSIS — D509 Iron deficiency anemia, unspecified: Secondary | ICD-10-CM | POA: Diagnosis not present

## 2014-11-13 DIAGNOSIS — Z992 Dependence on renal dialysis: Secondary | ICD-10-CM | POA: Diagnosis not present

## 2014-11-13 DIAGNOSIS — D509 Iron deficiency anemia, unspecified: Secondary | ICD-10-CM | POA: Diagnosis not present

## 2014-11-13 DIAGNOSIS — N2581 Secondary hyperparathyroidism of renal origin: Secondary | ICD-10-CM | POA: Diagnosis not present

## 2014-11-13 DIAGNOSIS — D631 Anemia in chronic kidney disease: Secondary | ICD-10-CM | POA: Diagnosis not present

## 2014-11-13 DIAGNOSIS — N186 End stage renal disease: Secondary | ICD-10-CM | POA: Diagnosis not present

## 2014-11-14 DIAGNOSIS — Z992 Dependence on renal dialysis: Secondary | ICD-10-CM | POA: Diagnosis not present

## 2014-11-14 DIAGNOSIS — N186 End stage renal disease: Secondary | ICD-10-CM | POA: Diagnosis not present

## 2014-11-15 ENCOUNTER — Other Ambulatory Visit (HOSPITAL_COMMUNITY): Payer: Self-pay | Admitting: Family Medicine

## 2014-11-15 DIAGNOSIS — D509 Iron deficiency anemia, unspecified: Secondary | ICD-10-CM | POA: Diagnosis not present

## 2014-11-15 DIAGNOSIS — N2581 Secondary hyperparathyroidism of renal origin: Secondary | ICD-10-CM | POA: Diagnosis not present

## 2014-11-15 DIAGNOSIS — N186 End stage renal disease: Secondary | ICD-10-CM | POA: Diagnosis not present

## 2014-11-15 DIAGNOSIS — D631 Anemia in chronic kidney disease: Secondary | ICD-10-CM | POA: Diagnosis not present

## 2014-11-15 DIAGNOSIS — Z992 Dependence on renal dialysis: Secondary | ICD-10-CM | POA: Diagnosis not present

## 2014-11-15 DIAGNOSIS — R42 Dizziness and giddiness: Secondary | ICD-10-CM

## 2014-11-15 LAB — CUP PACEART REMOTE DEVICE CHECK: MDC IDC SESS DTM: 20160601135510

## 2014-11-17 ENCOUNTER — Encounter: Payer: Self-pay | Admitting: Internal Medicine

## 2014-11-17 DIAGNOSIS — Z992 Dependence on renal dialysis: Secondary | ICD-10-CM | POA: Diagnosis not present

## 2014-11-17 DIAGNOSIS — N2581 Secondary hyperparathyroidism of renal origin: Secondary | ICD-10-CM | POA: Diagnosis not present

## 2014-11-17 DIAGNOSIS — N186 End stage renal disease: Secondary | ICD-10-CM | POA: Diagnosis not present

## 2014-11-17 DIAGNOSIS — D631 Anemia in chronic kidney disease: Secondary | ICD-10-CM | POA: Diagnosis not present

## 2014-11-17 DIAGNOSIS — D509 Iron deficiency anemia, unspecified: Secondary | ICD-10-CM | POA: Diagnosis not present

## 2014-11-19 ENCOUNTER — Emergency Department (HOSPITAL_COMMUNITY): Payer: Medicare Other

## 2014-11-19 ENCOUNTER — Encounter (HOSPITAL_COMMUNITY): Payer: Self-pay

## 2014-11-19 ENCOUNTER — Emergency Department (HOSPITAL_COMMUNITY)
Admission: EM | Admit: 2014-11-19 | Discharge: 2014-11-19 | Disposition: A | Discharge status: Home / Self Care | Payer: Medicare Other | Attending: Emergency Medicine | Admitting: Emergency Medicine

## 2014-11-19 DIAGNOSIS — N1330 Unspecified hydronephrosis: Secondary | ICD-10-CM | POA: Diagnosis not present

## 2014-11-19 DIAGNOSIS — Z794 Long term (current) use of insulin: Secondary | ICD-10-CM | POA: Insufficient documentation

## 2014-11-19 DIAGNOSIS — K5730 Diverticulosis of large intestine without perforation or abscess without bleeding: Secondary | ICD-10-CM | POA: Diagnosis not present

## 2014-11-19 DIAGNOSIS — Z8669 Personal history of other diseases of the nervous system and sense organs: Secondary | ICD-10-CM | POA: Insufficient documentation

## 2014-11-19 DIAGNOSIS — K8020 Calculus of gallbladder without cholecystitis without obstruction: Secondary | ICD-10-CM | POA: Diagnosis not present

## 2014-11-19 DIAGNOSIS — I120 Hypertensive chronic kidney disease with stage 5 chronic kidney disease or end stage renal disease: Secondary | ICD-10-CM | POA: Diagnosis not present

## 2014-11-19 DIAGNOSIS — N23 Unspecified renal colic: Secondary | ICD-10-CM | POA: Insufficient documentation

## 2014-11-19 DIAGNOSIS — R1031 Right lower quadrant pain: Secondary | ICD-10-CM | POA: Diagnosis not present

## 2014-11-19 DIAGNOSIS — R109 Unspecified abdominal pain: Secondary | ICD-10-CM | POA: Diagnosis present

## 2014-11-19 DIAGNOSIS — Z872 Personal history of diseases of the skin and subcutaneous tissue: Secondary | ICD-10-CM | POA: Insufficient documentation

## 2014-11-19 DIAGNOSIS — Z992 Dependence on renal dialysis: Secondary | ICD-10-CM | POA: Diagnosis not present

## 2014-11-19 DIAGNOSIS — Z8673 Personal history of transient ischemic attack (TIA), and cerebral infarction without residual deficits: Secondary | ICD-10-CM | POA: Diagnosis not present

## 2014-11-19 DIAGNOSIS — Z8719 Personal history of other diseases of the digestive system: Secondary | ICD-10-CM | POA: Insufficient documentation

## 2014-11-19 DIAGNOSIS — N186 End stage renal disease: Secondary | ICD-10-CM | POA: Insufficient documentation

## 2014-11-19 DIAGNOSIS — Z79899 Other long term (current) drug therapy: Secondary | ICD-10-CM | POA: Insufficient documentation

## 2014-11-19 DIAGNOSIS — E119 Type 2 diabetes mellitus without complications: Secondary | ICD-10-CM | POA: Insufficient documentation

## 2014-11-19 LAB — COMPREHENSIVE METABOLIC PANEL
ALBUMIN: 3.8 g/dL (ref 3.5–5.0)
ALT: 25 U/L (ref 14–54)
AST: 30 U/L (ref 15–41)
Alkaline Phosphatase: 81 U/L (ref 38–126)
Anion gap: 14 (ref 5–15)
BUN: 40 mg/dL — AB (ref 6–20)
CO2: 25 mmol/L (ref 22–32)
Calcium: 8.8 mg/dL — ABNORMAL LOW (ref 8.9–10.3)
Chloride: 101 mmol/L (ref 101–111)
Creatinine, Ser: 7.57 mg/dL — ABNORMAL HIGH (ref 0.44–1.00)
GFR calc Af Amer: 5 mL/min — ABNORMAL LOW (ref >60)
GFR calc non Af Amer: 5 mL/min — ABNORMAL LOW (ref >60)
Glucose, Bld: 252 mg/dL — ABNORMAL HIGH (ref 65–99)
POTASSIUM: 5 mmol/L (ref 3.5–5.1)
Sodium: 140 mmol/L (ref 135–145)
Total Bilirubin: 0.8 mg/dL (ref 0.3–1.2)
Total Protein: 7.9 g/dL (ref 6.5–8.1)

## 2014-11-19 LAB — URINALYSIS, ROUTINE W REFLEX MICROSCOPIC
Bilirubin Urine: NEGATIVE
GLUCOSE, UA: 100 mg/dL — AB
LEUKOCYTES UA: NEGATIVE
Nitrite: NEGATIVE
Protein, ur: >300 mg/dL — AB
Specific Gravity, Urine: 1.02 (ref 1.005–1.030)
Urobilinogen, UA: 0.2 mg/dL (ref 0.0–1.0)
pH: 7.5 (ref 5.0–8.0)

## 2014-11-19 LAB — CBC WITH DIFFERENTIAL/PLATELET
BASOS PCT: 0 % (ref 0–1)
Basophils Absolute: 0 10*3/uL (ref 0.0–0.1)
EOS ABS: 0 10*3/uL (ref 0.0–0.7)
Eosinophils Relative: 0 % (ref 0–5)
HCT: 37.7 % (ref 36.0–46.0)
Hemoglobin: 12.1 g/dL (ref 12.0–15.0)
LYMPHS ABS: 1.2 10*3/uL (ref 0.7–4.0)
Lymphocytes Relative: 9 % — ABNORMAL LOW (ref 12–46)
MCH: 30.5 pg (ref 26.0–34.0)
MCHC: 32.1 g/dL (ref 30.0–36.0)
MCV: 95 fL (ref 78.0–100.0)
MONO ABS: 0.8 10*3/uL (ref 0.1–1.0)
Monocytes Relative: 6 % (ref 3–12)
NEUTROS PCT: 85 % — AB (ref 43–77)
Neutro Abs: 12 10*3/uL — ABNORMAL HIGH (ref 1.7–7.7)
Platelets: 147 10*3/uL — ABNORMAL LOW (ref 150–400)
RBC: 3.97 MIL/uL (ref 3.87–5.11)
RDW: 14.3 % (ref 11.5–15.5)
WBC: 14 10*3/uL — ABNORMAL HIGH (ref 4.0–10.5)

## 2014-11-19 LAB — URINE MICROSCOPIC-ADD ON

## 2014-11-19 LAB — I-STAT CG4 LACTIC ACID, ED: Lactic Acid, Venous: 1.34 mmol/L (ref 0.5–2.0)

## 2014-11-19 LAB — LIPASE, BLOOD: Lipase: 27 U/L (ref 22–51)

## 2014-11-19 MED ORDER — IOHEXOL 300 MG/ML  SOLN
50.0000 mL | Freq: Once | INTRAMUSCULAR | Status: AC | PRN
  Administered 2014-11-19: 50 mL via ORAL

## 2014-11-19 MED ORDER — ONDANSETRON HCL 4 MG/2ML IJ SOLN
4.0000 mg | Freq: Once | INTRAMUSCULAR | Status: AC
  Administered 2014-11-19: 4 mg via INTRAMUSCULAR
  Filled 2014-11-19: qty 2

## 2014-11-19 MED ORDER — OXYCODONE-ACETAMINOPHEN 5-325 MG PO TABS: 1.0000 | ORAL_TABLET | ORAL | Status: DC | PRN

## 2014-11-19 MED ORDER — MORPHINE SULFATE 4 MG/ML IJ SOLN
4.0000 mg | Freq: Once | INTRAMUSCULAR | Status: AC
  Administered 2014-11-19: 4 mg via INTRAVENOUS
  Filled 2014-11-19: qty 1

## 2014-11-19 NOTE — ED Notes (Signed)
Patient reports R Lower abdomen started hurting last night. Pain is constant pressure. Pain is causing her to vomit

## 2014-11-19 NOTE — ED Provider Notes (Signed)
CSN: [id number]     Arrival date & time 11/19/14  [id number] History   First MD Initiated Contact with Patient 11/19/14 [phone]     Chief Complaint  Patient presents with  . Abdominal Pain     (Consider location/radiation/quality/duration/timing/severity/associated sxs/prior Treatment) HPI Comments: Patient presents to the ER for evaluation of abdominal pain. Patient reports that she started to have pain in the right lateral aspect of the abdomen last night. Patient reports that the pain is constant and she has not identified any alleviating or exacerbating factors. She has had nausea and vomiting secondary to the pain. Patient is a dialysis patient, but she does urinate 2 or 3 times a day normally. She has not noticed any urinary symptoms. She is not expressing any chest pain. There has not been any fever.  Patient is a 74 y.o. female presenting with abdominal pain.  Abdominal Pain Associated symptoms: nausea and vomiting     Past Medical History  Diagnosis Date  . Cataract   . Chronic kidney disease   . Shortness of breath   . Anemia   . CVA (cerebral infarction)   . Dialysis patient   . Diabetes mellitus     Type 2  . Stroke     right side weakness  . GERD (gastroesophageal reflux disease)   . Renal disorder   . Diabetes mellitus without complication   . Hypertension   . Dialysis patient     M, W, F  . Fistula     R arm   Past Surgical History  Procedure Laterality Date  . Cataract extraction w/phaco  11/20/2011    Procedure: CATARACT EXTRACTION PHACO AND INTRAOCULAR LENS PLACEMENT (IOC);  Surgeon: Tonny Branch, MD;  Location: [facility];  Service: Ophthalmology;  Laterality: Right;  CDE 18.82  . Cataract extraction w/phaco Left 11/18/2012    Procedure: CATARACT EXTRACTION PHACO AND INTRAOCULAR LENS PLACEMENT (IOC);  Surgeon: Tonny Branch, MD;  Location: [facility];  Service: Ophthalmology;  Laterality: Left;  CDE: 18.97  . Colonoscopy N/A 02/09/2013    Procedure: COLONOSCOPY;  Surgeon: Rogene Houston, MD;  Location: [facility];  Service: Endoscopy;  Laterality: N/A;  305-moved to 220 Ann to notify pt  . Insertion of dialysis catheter Right 06/24/2013    Procedure: INSERTION OF DIALYSIS CATHETER: Ultrasound guided;  Surgeon: Serafina Mitchell, MD;  Location: [place];  Service: Vascular;  Laterality: Right;  . 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 Tee without cardioversion N/A 07/21/2013    Procedure: TRANSESOPHAGEAL ECHOCARDIOGRAM (TEE);  Surgeon: Dorothy Spark, MD;  Location: [place];  Service: Cardiovascular;  Laterality: N/A;  . Av fistula placement Right 09/08/2013    Procedure: CREATION OF RIGHT BRACHIAL CEPHALIC ARTERIOVENOUS FISTULA ;  Surgeon: Mal Misty, MD;  Location: [place];  Service: Vascular;  Laterality: Right;  . Bascilic vein transposition Right 01/26/2014    Procedure: Right Arm BASILIC VEIN TRANSPOSITION;  Surgeon: Mal Misty, MD;  Location: [place];  Service: Vascular;  Laterality: Right;  . Ligation of arteriovenous  fistula Right 01/26/2014    Procedure: LIGATION OF ARTERIOVENOUS  FISTULA;  Surgeon: Mal Misty, MD;  Location: [place];  Service: Vascular;  Laterality: Right;  . Loop recorder implant N/A 07/21/2013    Procedure: LOOP RECORDER IMPLANT;  Surgeon: Evans Lance, MD;  Location: [facility] C[facility];  Service: Cardiovascular;  Laterality: N/A;   Family History  Problem Relation Age of Onset  . Anesthesia problems Neg Hx   . Hypotension Neg Hx   . Malignant hyperthermia Neg Hx   . Pseudochol deficiency Neg Hx   . Cancer Sister   . Cancer Brother    History  Substance Use Topics  . Smoking status: Never Smoker   . Smokeless tobacco: Not on file  . Alcohol Use: No   OB History    Gravida Para Term Preterm AB TAB SAB Ectopic Multiple Living   0 0 0 0 0 0 0 0       Review of Systems  Gastrointestinal: Positive for nausea, vomiting and abdominal pain.  All other systems reviewed and are  negative.     Allergies  Ambien; Reglan; and Reglan  Home Medications   Prior to Admission medications   Medication Sig Start Date End Date Taking? Authorizing Provider  albuterol (PROVENTIL HFA;VENTOLIN HFA) 108 (90 BASE) MCG/ACT inhaler Inhale 1-2 puffs into the lungs every 6 (six) hours as needed for wheezing. 08/02/14  Yes Tanna Furry, MD  atorvastatin (LIPITOR) 20 MG tablet Take 1 tablet by mouth daily. 11/09/14  Yes Historical Provider, MD  cyanocobalamin (,VITAMIN B-12,) 1000 MCG/ML injection Inject 1 mL into the muscle every 30 (thirty) days. 11/01/14  Yes Historical Provider, MD  ibuprofen (ADVIL,MOTRIN) 200 MG tablet Take 200-400 mg by mouth every 6 (six) hours as needed for moderate pain.    Yes Historical Provider, MD  insulin NPH Human (HUMULIN N,NOVOLIN N) 100 UNIT/ML injection Inject 10 Units into the skin 2 (two) times daily before a meal.   Yes Historical Provider, MD  labetalol (NORMODYNE) 300 MG tablet Take 300 mg by mouth 2 (two) times daily. 11/06/14  Yes Historical Provider, MD  meclizine (ANTIVERT) 25 MG tablet Take 1 tablet by mouth every 6 (six) hours as needed for dizziness.  11/07/14  Yes Historical Provider, MD  multivitamin (RENA-VIT) TABS tablet Take 1 tablet by mouth at bedtime. 08/21/14  Yes Charlett Blake, MD  azithromycin (ZITHROMAX Z-PAK) 250 MG tablet Take 1 tablet (250 mg total) by mouth daily. Patient not taking: Reported on 11/19/2014 08/02/14   Tanna Furry, MD  chlorpheniramine-HYDROcodone [facility]) 10-8 MG/5ML [facility] Take 5 mLs by mouth every 12 (twelve) hours. Patient not taking: Reported on 11/19/2014 08/02/14   Tanna Furry, MD  oxyCODONE (ROXICODONE) 5 MG immediate release tablet Take 1 tablet (5 mg total) by mouth every 6 (six) hours as needed for severe pain. Patient not taking: Reported on 11/19/2014 01/26/14   Alvia Grove, PA-C  predniSONE (DELTASONE) 20 MG tablet Take 1 tablet (20 mg total) by mouth daily with breakfast. Patient not  taking: Reported on 11/19/2014 08/02/14   Tanna Furry, MD   BP 198/88 mmHg  Pulse 91  Temp(Src) 97.9 F (36.6 C)  Resp 20  Ht 5\' 7"  (1.702 m)  Wt 172 lb (78.019 kg)  BMI 26.93 kg/m2  SpO2 95% Physical Exam  Constitutional: She is oriented to person, place, and time. She appears well-developed and well-nourished. No distress.  HENT:  Head: Normocephalic and atraumatic.  Right Ear: Hearing normal.  Left Ear: Hearing normal.  Nose: Nose normal.  Mouth/Throat: Oropharynx is clear and moist and mucous membranes are normal.  Eyes: Conjunctivae and EOM are normal. Pupils are equal, round, and reactive to light.  Neck: Normal range of motion. Neck supple.  Cardiovascular: Regular rhythm, S1 normal and S2 normal.  Exam reveals no gallop and no friction rub.  No murmur heard. Pulmonary/Chest: Effort normal and breath sounds normal. No respiratory distress. She exhibits no tenderness.  Abdominal: Soft. Normal appearance and bowel sounds are normal. There is no hepatosplenomegaly. There is no tenderness. There is no rebound, no guarding, no tenderness at McBurney's point and negative Murphy's sign. No hernia.  Musculoskeletal: Normal range of motion.  Neurological: She is alert and oriented to person, place, and time. She has normal strength. No cranial nerve deficit or sensory deficit. Coordination normal. GCS eye subscore is 4. GCS verbal subscore is 5. GCS motor subscore is 6.  Skin: Skin is warm, dry and intact. No rash noted. No cyanosis.  Psychiatric: She has a normal mood and affect. Her speech is normal and behavior is normal. Thought content normal.  Nursing note and vitals reviewed.   ED Course  Procedures (including critical care time) Labs Review Labs Reviewed  CBC WITH DIFFERENTIAL/PLATELET - Abnormal; Notable for the following:    WBC 14.0 (*)    Platelets 147 (*)    Neutrophils Relative % 85 (*)    Neutro Abs 12.0 (*)    Lymphocytes Relative 9 (*)    All other components  within normal limits  COMPREHENSIVE METABOLIC PANEL - Abnormal; Notable for the following:    Glucose, Bld 252 (*)    BUN 40 (*)    Creatinine, Ser 7.57 (*)    Calcium 8.8 (*)    GFR calc non Af Amer 5 (*)    GFR calc Af Amer 5 (*)    All other components within normal limits  URINALYSIS, ROUTINE W REFLEX MICROSCOPIC (NOT AT [facility]) - Abnormal; Notable for the following:    Glucose, UA 100 (*)    Hgb urine dipstick MODERATE (*)    Ketones, ur TRACE (*)    Protein, ur >300 (*)    All other components within normal limits  LIPASE, BLOOD  URINE MICROSCOPIC-ADD ON  I-STAT CG4 LACTIC ACID, ED    Imaging Review Ct Abdomen Pelvis Wo Contrast  11/19/2014   CLINICAL DATA:  Right lower abdominal pain beginning last evening. Constant pressure. Vomiting.  EXAM: CT ABDOMEN AND PELVIS WITHOUT CONTRAST  TECHNIQUE: Multidetector CT imaging of the abdomen and pelvis was performed following the standard protocol without IV contrast.  COMPARISON:  CT of the abdomen and pelvis 12/20/2013.  FINDINGS: Scarring or atelectasis is again noted at the lung bases bilaterally. The heart is mildly enlarged. Coronary artery calcifications are present. No significant pleural or pericardial effusion is present. There is some pleural thickening on the left which appears chronic.  The liver and spleen are within normal limits. The stomach, duodenum, and pancreas are within normal limits. The common bile duct is within normal limits. Gallstones are again noted. No inflammatory changes are present to suggest cholecystitis. The adrenal glands are within normal limits bilaterally.  There is mild dilation of the right renal collecting system and stranding about the right kidney. No obstructing lesion is evident. Left kidney in ureter are normal.  The rectosigmoid colon demonstrates diverticular changes without focal inflammation to suggest diverticulitis there is some wall thickening and stranding near the splenic flexure, similar to  the prior study. The more proximal colon is within normal limits. The appendix is visualized and normal. Small bowel is unremarkable. Calcifications are present in the uterus. The adnexa are within normal limits for age.  The bone windows demonstrate degenerative changes at L4-5 and L5-S1 is well is the lower thoracic spine. No focal lytic or blastic  lesions are present.  IMPRESSION: 1. Mild right-sided hydronephrosis and periapical renal stranding with dilation of the proximal right ureter. This suggests partial obstruction or a recently passed stone. No obstructing stone is evident. No obstructing lesion is identified. 2. Cholelithiasis without evidence for cholecystitis. 3. Cardiomegaly without failure. 4. Sigmoid and descending colonic diverticulosis without evidence for diverticulitis or significant interval change.   Electronically Signed   By: San Morelle M.D.   On: 11/19/2014 13:11     EKG Interpretation None      MDM   Final diagnoses:  Abdominal pain  Renal Colic  Isn't complaining of pain in the right lateral abdominal region and flank area. She did not have any significant tenderness on examination. Pain was constant but did improve with treatment. Lab work was unremarkable. Urinalysis did not suggest infection, but she does have evidence of hematuria. CT scan was performed. She does have hydroureter and hydronephrosis, but no obvious stone is noted. This could be secondary to recently passed stone, as she is significantly improved. She also, however, could have external blockage and will need referral to urology. She'll be discharged with analgesia.    Orpah Greek, MD 11/19/14 1332

## 2014-11-19 NOTE — ED Notes (Signed)
Error in documentation. In sepsis screening SBP was not greater than 200.

## 2014-11-19 NOTE — Discharge Instructions (Signed)
Ureteral Colic (Kidney Stones) °Ureteral colic is the result of a condition when kidney stones form inside the kidney. Once kidney stones are formed they may move into the tube that connects the kidney with the bladder (ureter). If this occurs, this condition may cause pain (colic) in the ureter.  °CAUSES  °Pain is caused by stone movement in the ureter and the obstruction caused by the stone. °SYMPTOMS  °The pain comes and goes as the ureter contracts around the stone. The pain is usually intense, sharp, and stabbing in character. The location of the pain may move as the stone moves through the ureter. When the stone is near the kidney the pain is usually located in the back and radiates to the belly (abdomen). When the stone is ready to pass into the bladder the pain is often located in the lower abdomen on the side the stone is located. At this location, the symptoms may mimic those of a urinary tract infection with urinary frequency. Once the stone is located here it often passes into the bladder and the pain disappears completely. °TREATMENT  °· Your caregiver will provide you with medicine for pain relief. °· You may require specialized follow-up X-rays. °· The absence of pain does not always mean that the stone has passed. It may have just stopped moving. If the urine remains completely obstructed, it can cause loss of kidney function or even complete destruction of the involved kidney. It is your responsibility and in your interest that X-rays and follow-ups as suggested by your caregiver are completed. Relief of pain without passage of the stone can be associated with severe damage to the kidney, including loss of kidney function on that side. °· If your stone does not pass on its own, additional measures may be taken by your caregiver to ensure its removal. °HOME CARE INSTRUCTIONS  °· Increase your fluid intake. Water is the preferred fluid since juices containing vitamin C may acidify the urine making it  less likely for certain stones (uric acid stones) to pass. °· Strain all urine. A strainer will be provided. Keep all particulate matter or stones for your caregiver to inspect. °· Take your pain medicine as directed. °· Make a follow-up appointment with your caregiver as directed. °· Remember that the goal is passage of your stone. The absence of pain does not mean the stone is gone. Follow your caregiver's instructions. °· Only take over-the-counter or prescription medicines for pain, discomfort, or fever as directed by your caregiver. °SEEK MEDICAL CARE IF:  °· Pain cannot be controlled with the prescribed medicine. °· You have a fever. °· Pain continues for longer than your caregiver advises it should. °· There is a change in the pain, and you develop chest discomfort or constant abdominal pain. °· You feel faint or pass out. °MAKE SURE YOU:  °· Understand these instructions. °· Will watch your condition. °· Will get help right away if you are not doing well or get worse. °Document Released: 03/12/2005 Document Revised: 09/27/2012 Document Reviewed: 11/27/2010 °[employer]® Patient Information ©2015 [employer]. This information is not intended to replace advice given to you by your health care provider. Make sure you discuss any questions you have with your health care provider. ° °

## 2014-11-20 DIAGNOSIS — D509 Iron deficiency anemia, unspecified: Secondary | ICD-10-CM | POA: Diagnosis not present

## 2014-11-20 DIAGNOSIS — N2581 Secondary hyperparathyroidism of renal origin: Secondary | ICD-10-CM | POA: Diagnosis not present

## 2014-11-20 DIAGNOSIS — N186 End stage renal disease: Secondary | ICD-10-CM | POA: Diagnosis not present

## 2014-11-20 DIAGNOSIS — D631 Anemia in chronic kidney disease: Secondary | ICD-10-CM | POA: Diagnosis not present

## 2014-11-20 DIAGNOSIS — Z992 Dependence on renal dialysis: Secondary | ICD-10-CM | POA: Diagnosis not present

## 2014-11-21 ENCOUNTER — Ambulatory Visit (INDEPENDENT_AMBULATORY_CARE_PROVIDER_SITE_OTHER): Payer: Medicare Other | Admitting: *Deleted

## 2014-11-21 ENCOUNTER — Ambulatory Visit (HOSPITAL_COMMUNITY)
Admission: RE | Admit: 2014-11-21 | Discharge: 2014-11-21 | Disposition: A | Discharge status: Home / Self Care | Payer: Medicare Other | Source: Ambulatory Visit | Attending: Family Medicine | Admitting: Family Medicine

## 2014-11-21 DIAGNOSIS — H539 Unspecified visual disturbance: Secondary | ICD-10-CM | POA: Insufficient documentation

## 2014-11-21 DIAGNOSIS — N186 End stage renal disease: Secondary | ICD-10-CM | POA: Diagnosis not present

## 2014-11-21 DIAGNOSIS — Z794 Long term (current) use of insulin: Secondary | ICD-10-CM | POA: Diagnosis not present

## 2014-11-21 DIAGNOSIS — E119 Type 2 diabetes mellitus without complications: Secondary | ICD-10-CM | POA: Diagnosis not present

## 2014-11-21 DIAGNOSIS — I10 Essential (primary) hypertension: Secondary | ICD-10-CM | POA: Diagnosis not present

## 2014-11-21 DIAGNOSIS — R55 Syncope and collapse: Secondary | ICD-10-CM | POA: Insufficient documentation

## 2014-11-21 DIAGNOSIS — R42 Dizziness and giddiness: Secondary | ICD-10-CM | POA: Insufficient documentation

## 2014-11-21 DIAGNOSIS — E785 Hyperlipidemia, unspecified: Secondary | ICD-10-CM | POA: Diagnosis not present

## 2014-11-21 DIAGNOSIS — I639 Cerebral infarction, unspecified: Secondary | ICD-10-CM

## 2014-11-21 DIAGNOSIS — I6523 Occlusion and stenosis of bilateral carotid arteries: Secondary | ICD-10-CM | POA: Diagnosis not present

## 2014-11-22 DIAGNOSIS — N186 End stage renal disease: Secondary | ICD-10-CM | POA: Diagnosis not present

## 2014-11-22 DIAGNOSIS — D631 Anemia in chronic kidney disease: Secondary | ICD-10-CM | POA: Diagnosis not present

## 2014-11-22 DIAGNOSIS — N2581 Secondary hyperparathyroidism of renal origin: Secondary | ICD-10-CM | POA: Diagnosis not present

## 2014-11-22 DIAGNOSIS — D509 Iron deficiency anemia, unspecified: Secondary | ICD-10-CM | POA: Diagnosis not present

## 2014-11-22 DIAGNOSIS — Z992 Dependence on renal dialysis: Secondary | ICD-10-CM | POA: Diagnosis not present

## 2014-11-23 DIAGNOSIS — E780 Pure hypercholesterolemia: Secondary | ICD-10-CM | POA: Diagnosis not present

## 2014-11-23 DIAGNOSIS — I129 Hypertensive chronic kidney disease with stage 1 through stage 4 chronic kidney disease, or unspecified chronic kidney disease: Secondary | ICD-10-CM | POA: Diagnosis not present

## 2014-11-23 NOTE — Progress Notes (Signed)
Loop recorder 

## 2014-11-24 DIAGNOSIS — D509 Iron deficiency anemia, unspecified: Secondary | ICD-10-CM | POA: Diagnosis not present

## 2014-11-24 DIAGNOSIS — N2581 Secondary hyperparathyroidism of renal origin: Secondary | ICD-10-CM | POA: Diagnosis not present

## 2014-11-24 DIAGNOSIS — N186 End stage renal disease: Secondary | ICD-10-CM | POA: Diagnosis not present

## 2014-11-24 DIAGNOSIS — D631 Anemia in chronic kidney disease: Secondary | ICD-10-CM | POA: Diagnosis not present

## 2014-11-24 DIAGNOSIS — Z992 Dependence on renal dialysis: Secondary | ICD-10-CM | POA: Diagnosis not present

## 2014-11-27 DIAGNOSIS — D631 Anemia in chronic kidney disease: Secondary | ICD-10-CM | POA: Diagnosis not present

## 2014-11-27 DIAGNOSIS — N186 End stage renal disease: Secondary | ICD-10-CM | POA: Diagnosis not present

## 2014-11-27 DIAGNOSIS — D509 Iron deficiency anemia, unspecified: Secondary | ICD-10-CM | POA: Diagnosis not present

## 2014-11-27 DIAGNOSIS — N2581 Secondary hyperparathyroidism of renal origin: Secondary | ICD-10-CM | POA: Diagnosis not present

## 2014-11-27 DIAGNOSIS — Z992 Dependence on renal dialysis: Secondary | ICD-10-CM | POA: Diagnosis not present

## 2014-11-29 DIAGNOSIS — N186 End stage renal disease: Secondary | ICD-10-CM | POA: Diagnosis not present

## 2014-11-29 DIAGNOSIS — D631 Anemia in chronic kidney disease: Secondary | ICD-10-CM | POA: Diagnosis not present

## 2014-11-29 DIAGNOSIS — N2581 Secondary hyperparathyroidism of renal origin: Secondary | ICD-10-CM | POA: Diagnosis not present

## 2014-11-29 DIAGNOSIS — Z992 Dependence on renal dialysis: Secondary | ICD-10-CM | POA: Diagnosis not present

## 2014-11-29 DIAGNOSIS — D509 Iron deficiency anemia, unspecified: Secondary | ICD-10-CM | POA: Diagnosis not present

## 2014-12-01 ENCOUNTER — Encounter: Payer: Self-pay | Admitting: Internal Medicine

## 2014-12-01 DIAGNOSIS — N2581 Secondary hyperparathyroidism of renal origin: Secondary | ICD-10-CM | POA: Diagnosis not present

## 2014-12-01 DIAGNOSIS — N186 End stage renal disease: Secondary | ICD-10-CM | POA: Diagnosis not present

## 2014-12-01 DIAGNOSIS — D631 Anemia in chronic kidney disease: Secondary | ICD-10-CM | POA: Diagnosis not present

## 2014-12-01 DIAGNOSIS — Z992 Dependence on renal dialysis: Secondary | ICD-10-CM | POA: Diagnosis not present

## 2014-12-01 DIAGNOSIS — D509 Iron deficiency anemia, unspecified: Secondary | ICD-10-CM | POA: Diagnosis not present

## 2014-12-04 ENCOUNTER — Encounter: Payer: Self-pay | Admitting: Internal Medicine

## 2014-12-04 DIAGNOSIS — N2581 Secondary hyperparathyroidism of renal origin: Secondary | ICD-10-CM | POA: Diagnosis not present

## 2014-12-04 DIAGNOSIS — D631 Anemia in chronic kidney disease: Secondary | ICD-10-CM | POA: Diagnosis not present

## 2014-12-04 DIAGNOSIS — D509 Iron deficiency anemia, unspecified: Secondary | ICD-10-CM | POA: Diagnosis not present

## 2014-12-04 DIAGNOSIS — N186 End stage renal disease: Secondary | ICD-10-CM | POA: Diagnosis not present

## 2014-12-04 DIAGNOSIS — Z992 Dependence on renal dialysis: Secondary | ICD-10-CM | POA: Diagnosis not present

## 2014-12-06 DIAGNOSIS — N2581 Secondary hyperparathyroidism of renal origin: Secondary | ICD-10-CM | POA: Diagnosis not present

## 2014-12-06 DIAGNOSIS — D509 Iron deficiency anemia, unspecified: Secondary | ICD-10-CM | POA: Diagnosis not present

## 2014-12-06 DIAGNOSIS — N186 End stage renal disease: Secondary | ICD-10-CM | POA: Diagnosis not present

## 2014-12-06 DIAGNOSIS — Z992 Dependence on renal dialysis: Secondary | ICD-10-CM | POA: Diagnosis not present

## 2014-12-06 DIAGNOSIS — D631 Anemia in chronic kidney disease: Secondary | ICD-10-CM | POA: Diagnosis not present

## 2014-12-06 LAB — CUP PACEART REMOTE DEVICE CHECK: Date Time Interrogation Session: 20160622120631

## 2014-12-08 DIAGNOSIS — D509 Iron deficiency anemia, unspecified: Secondary | ICD-10-CM | POA: Diagnosis not present

## 2014-12-08 DIAGNOSIS — Z992 Dependence on renal dialysis: Secondary | ICD-10-CM | POA: Diagnosis not present

## 2014-12-08 DIAGNOSIS — D631 Anemia in chronic kidney disease: Secondary | ICD-10-CM | POA: Diagnosis not present

## 2014-12-08 DIAGNOSIS — N2581 Secondary hyperparathyroidism of renal origin: Secondary | ICD-10-CM | POA: Diagnosis not present

## 2014-12-08 DIAGNOSIS — N186 End stage renal disease: Secondary | ICD-10-CM | POA: Diagnosis not present

## 2014-12-11 DIAGNOSIS — Z992 Dependence on renal dialysis: Secondary | ICD-10-CM | POA: Diagnosis not present

## 2014-12-11 DIAGNOSIS — N2581 Secondary hyperparathyroidism of renal origin: Secondary | ICD-10-CM | POA: Diagnosis not present

## 2014-12-11 DIAGNOSIS — D509 Iron deficiency anemia, unspecified: Secondary | ICD-10-CM | POA: Diagnosis not present

## 2014-12-11 DIAGNOSIS — D631 Anemia in chronic kidney disease: Secondary | ICD-10-CM | POA: Diagnosis not present

## 2014-12-11 DIAGNOSIS — N186 End stage renal disease: Secondary | ICD-10-CM | POA: Diagnosis not present

## 2014-12-13 DIAGNOSIS — Z992 Dependence on renal dialysis: Secondary | ICD-10-CM | POA: Diagnosis not present

## 2014-12-13 DIAGNOSIS — D509 Iron deficiency anemia, unspecified: Secondary | ICD-10-CM | POA: Diagnosis not present

## 2014-12-13 DIAGNOSIS — N2581 Secondary hyperparathyroidism of renal origin: Secondary | ICD-10-CM | POA: Diagnosis not present

## 2014-12-13 DIAGNOSIS — D631 Anemia in chronic kidney disease: Secondary | ICD-10-CM | POA: Diagnosis not present

## 2014-12-13 DIAGNOSIS — N186 End stage renal disease: Secondary | ICD-10-CM | POA: Diagnosis not present

## 2014-12-14 DIAGNOSIS — N186 End stage renal disease: Secondary | ICD-10-CM | POA: Diagnosis not present

## 2014-12-14 DIAGNOSIS — Z992 Dependence on renal dialysis: Secondary | ICD-10-CM | POA: Diagnosis not present

## 2014-12-15 DIAGNOSIS — D631 Anemia in chronic kidney disease: Secondary | ICD-10-CM | POA: Diagnosis not present

## 2014-12-15 DIAGNOSIS — Z992 Dependence on renal dialysis: Secondary | ICD-10-CM | POA: Diagnosis not present

## 2014-12-15 DIAGNOSIS — N186 End stage renal disease: Secondary | ICD-10-CM | POA: Diagnosis not present

## 2014-12-15 DIAGNOSIS — D510 Vitamin B12 deficiency anemia due to intrinsic factor deficiency: Secondary | ICD-10-CM | POA: Diagnosis not present

## 2014-12-15 DIAGNOSIS — D509 Iron deficiency anemia, unspecified: Secondary | ICD-10-CM | POA: Diagnosis not present

## 2014-12-15 DIAGNOSIS — N2581 Secondary hyperparathyroidism of renal origin: Secondary | ICD-10-CM | POA: Diagnosis not present

## 2014-12-18 DIAGNOSIS — D509 Iron deficiency anemia, unspecified: Secondary | ICD-10-CM | POA: Diagnosis not present

## 2014-12-18 DIAGNOSIS — D631 Anemia in chronic kidney disease: Secondary | ICD-10-CM | POA: Diagnosis not present

## 2014-12-18 DIAGNOSIS — Z992 Dependence on renal dialysis: Secondary | ICD-10-CM | POA: Diagnosis not present

## 2014-12-18 DIAGNOSIS — N186 End stage renal disease: Secondary | ICD-10-CM | POA: Diagnosis not present

## 2014-12-18 DIAGNOSIS — N2581 Secondary hyperparathyroidism of renal origin: Secondary | ICD-10-CM | POA: Diagnosis not present

## 2014-12-20 DIAGNOSIS — N186 End stage renal disease: Secondary | ICD-10-CM | POA: Diagnosis not present

## 2014-12-20 DIAGNOSIS — D631 Anemia in chronic kidney disease: Secondary | ICD-10-CM | POA: Diagnosis not present

## 2014-12-20 DIAGNOSIS — Z992 Dependence on renal dialysis: Secondary | ICD-10-CM | POA: Diagnosis not present

## 2014-12-20 DIAGNOSIS — N2581 Secondary hyperparathyroidism of renal origin: Secondary | ICD-10-CM | POA: Diagnosis not present

## 2014-12-20 DIAGNOSIS — D509 Iron deficiency anemia, unspecified: Secondary | ICD-10-CM | POA: Diagnosis not present

## 2014-12-21 ENCOUNTER — Ambulatory Visit (INDEPENDENT_AMBULATORY_CARE_PROVIDER_SITE_OTHER): Payer: Medicare Other | Admitting: *Deleted

## 2014-12-21 DIAGNOSIS — I6340 Cerebral infarction due to embolism of unspecified cerebral artery: Secondary | ICD-10-CM | POA: Diagnosis not present

## 2014-12-22 DIAGNOSIS — N186 End stage renal disease: Secondary | ICD-10-CM | POA: Diagnosis not present

## 2014-12-22 DIAGNOSIS — N2581 Secondary hyperparathyroidism of renal origin: Secondary | ICD-10-CM | POA: Diagnosis not present

## 2014-12-22 DIAGNOSIS — D631 Anemia in chronic kidney disease: Secondary | ICD-10-CM | POA: Diagnosis not present

## 2014-12-22 DIAGNOSIS — Z992 Dependence on renal dialysis: Secondary | ICD-10-CM | POA: Diagnosis not present

## 2014-12-22 DIAGNOSIS — D509 Iron deficiency anemia, unspecified: Secondary | ICD-10-CM | POA: Diagnosis not present

## 2014-12-25 DIAGNOSIS — N186 End stage renal disease: Secondary | ICD-10-CM | POA: Diagnosis not present

## 2014-12-25 DIAGNOSIS — D509 Iron deficiency anemia, unspecified: Secondary | ICD-10-CM | POA: Diagnosis not present

## 2014-12-25 DIAGNOSIS — E119 Type 2 diabetes mellitus without complications: Secondary | ICD-10-CM | POA: Diagnosis not present

## 2014-12-25 DIAGNOSIS — N2581 Secondary hyperparathyroidism of renal origin: Secondary | ICD-10-CM | POA: Diagnosis not present

## 2014-12-25 DIAGNOSIS — Z992 Dependence on renal dialysis: Secondary | ICD-10-CM | POA: Diagnosis not present

## 2014-12-25 DIAGNOSIS — D631 Anemia in chronic kidney disease: Secondary | ICD-10-CM | POA: Diagnosis not present

## 2014-12-25 DIAGNOSIS — Z794 Long term (current) use of insulin: Secondary | ICD-10-CM | POA: Diagnosis not present

## 2014-12-25 LAB — CUP PACEART REMOTE DEVICE CHECK: Date Time Interrogation Session: 20160711084816

## 2014-12-25 NOTE — Progress Notes (Signed)
Carelink summary report. No episodes. Battery ok. Continue remote monitoring.

## 2014-12-26 ENCOUNTER — Encounter: Payer: Self-pay | Admitting: Internal Medicine

## 2014-12-27 DIAGNOSIS — N186 End stage renal disease: Secondary | ICD-10-CM | POA: Diagnosis not present

## 2014-12-27 DIAGNOSIS — D631 Anemia in chronic kidney disease: Secondary | ICD-10-CM | POA: Diagnosis not present

## 2014-12-27 DIAGNOSIS — Z992 Dependence on renal dialysis: Secondary | ICD-10-CM | POA: Diagnosis not present

## 2014-12-27 DIAGNOSIS — D509 Iron deficiency anemia, unspecified: Secondary | ICD-10-CM | POA: Diagnosis not present

## 2014-12-27 DIAGNOSIS — N2581 Secondary hyperparathyroidism of renal origin: Secondary | ICD-10-CM | POA: Diagnosis not present

## 2014-12-29 DIAGNOSIS — N186 End stage renal disease: Secondary | ICD-10-CM | POA: Diagnosis not present

## 2014-12-29 DIAGNOSIS — D631 Anemia in chronic kidney disease: Secondary | ICD-10-CM | POA: Diagnosis not present

## 2014-12-29 DIAGNOSIS — Z992 Dependence on renal dialysis: Secondary | ICD-10-CM | POA: Diagnosis not present

## 2014-12-29 DIAGNOSIS — D509 Iron deficiency anemia, unspecified: Secondary | ICD-10-CM | POA: Diagnosis not present

## 2014-12-29 DIAGNOSIS — N2581 Secondary hyperparathyroidism of renal origin: Secondary | ICD-10-CM | POA: Diagnosis not present

## 2015-01-01 DIAGNOSIS — N2581 Secondary hyperparathyroidism of renal origin: Secondary | ICD-10-CM | POA: Diagnosis not present

## 2015-01-01 DIAGNOSIS — D631 Anemia in chronic kidney disease: Secondary | ICD-10-CM | POA: Diagnosis not present

## 2015-01-01 DIAGNOSIS — Z992 Dependence on renal dialysis: Secondary | ICD-10-CM | POA: Diagnosis not present

## 2015-01-01 DIAGNOSIS — N186 End stage renal disease: Secondary | ICD-10-CM | POA: Diagnosis not present

## 2015-01-01 DIAGNOSIS — D509 Iron deficiency anemia, unspecified: Secondary | ICD-10-CM | POA: Diagnosis not present

## 2015-01-03 DIAGNOSIS — D509 Iron deficiency anemia, unspecified: Secondary | ICD-10-CM | POA: Diagnosis not present

## 2015-01-03 DIAGNOSIS — D631 Anemia in chronic kidney disease: Secondary | ICD-10-CM | POA: Diagnosis not present

## 2015-01-03 DIAGNOSIS — N2581 Secondary hyperparathyroidism of renal origin: Secondary | ICD-10-CM | POA: Diagnosis not present

## 2015-01-03 DIAGNOSIS — Z992 Dependence on renal dialysis: Secondary | ICD-10-CM | POA: Diagnosis not present

## 2015-01-03 DIAGNOSIS — N186 End stage renal disease: Secondary | ICD-10-CM | POA: Diagnosis not present

## 2015-01-05 DIAGNOSIS — N186 End stage renal disease: Secondary | ICD-10-CM | POA: Diagnosis not present

## 2015-01-05 DIAGNOSIS — D509 Iron deficiency anemia, unspecified: Secondary | ICD-10-CM | POA: Diagnosis not present

## 2015-01-05 DIAGNOSIS — Z992 Dependence on renal dialysis: Secondary | ICD-10-CM | POA: Diagnosis not present

## 2015-01-05 DIAGNOSIS — D631 Anemia in chronic kidney disease: Secondary | ICD-10-CM | POA: Diagnosis not present

## 2015-01-05 DIAGNOSIS — N2581 Secondary hyperparathyroidism of renal origin: Secondary | ICD-10-CM | POA: Diagnosis not present

## 2015-01-08 DIAGNOSIS — M79671 Pain in right foot: Secondary | ICD-10-CM | POA: Diagnosis not present

## 2015-01-08 DIAGNOSIS — L609 Nail disorder, unspecified: Secondary | ICD-10-CM | POA: Diagnosis not present

## 2015-01-08 DIAGNOSIS — B351 Tinea unguium: Secondary | ICD-10-CM | POA: Diagnosis not present

## 2015-01-08 DIAGNOSIS — D631 Anemia in chronic kidney disease: Secondary | ICD-10-CM | POA: Diagnosis not present

## 2015-01-08 DIAGNOSIS — L851 Acquired keratosis [keratoderma] palmaris et plantaris: Secondary | ICD-10-CM | POA: Diagnosis not present

## 2015-01-08 DIAGNOSIS — I739 Peripheral vascular disease, unspecified: Secondary | ICD-10-CM | POA: Diagnosis not present

## 2015-01-08 DIAGNOSIS — Z992 Dependence on renal dialysis: Secondary | ICD-10-CM | POA: Diagnosis not present

## 2015-01-08 DIAGNOSIS — D509 Iron deficiency anemia, unspecified: Secondary | ICD-10-CM | POA: Diagnosis not present

## 2015-01-08 DIAGNOSIS — N186 End stage renal disease: Secondary | ICD-10-CM | POA: Diagnosis not present

## 2015-01-08 DIAGNOSIS — N2581 Secondary hyperparathyroidism of renal origin: Secondary | ICD-10-CM | POA: Diagnosis not present

## 2015-01-10 DIAGNOSIS — Z992 Dependence on renal dialysis: Secondary | ICD-10-CM | POA: Diagnosis not present

## 2015-01-10 DIAGNOSIS — D631 Anemia in chronic kidney disease: Secondary | ICD-10-CM | POA: Diagnosis not present

## 2015-01-10 DIAGNOSIS — N186 End stage renal disease: Secondary | ICD-10-CM | POA: Diagnosis not present

## 2015-01-10 DIAGNOSIS — N2581 Secondary hyperparathyroidism of renal origin: Secondary | ICD-10-CM | POA: Diagnosis not present

## 2015-01-10 DIAGNOSIS — D509 Iron deficiency anemia, unspecified: Secondary | ICD-10-CM | POA: Diagnosis not present

## 2015-01-12 DIAGNOSIS — D631 Anemia in chronic kidney disease: Secondary | ICD-10-CM | POA: Diagnosis not present

## 2015-01-12 DIAGNOSIS — N2581 Secondary hyperparathyroidism of renal origin: Secondary | ICD-10-CM | POA: Diagnosis not present

## 2015-01-12 DIAGNOSIS — D509 Iron deficiency anemia, unspecified: Secondary | ICD-10-CM | POA: Diagnosis not present

## 2015-01-12 DIAGNOSIS — N186 End stage renal disease: Secondary | ICD-10-CM | POA: Diagnosis not present

## 2015-01-12 DIAGNOSIS — Z992 Dependence on renal dialysis: Secondary | ICD-10-CM | POA: Diagnosis not present

## 2015-01-14 DIAGNOSIS — N186 End stage renal disease: Secondary | ICD-10-CM | POA: Diagnosis not present

## 2015-01-14 DIAGNOSIS — Z992 Dependence on renal dialysis: Secondary | ICD-10-CM | POA: Diagnosis not present

## 2015-01-15 DIAGNOSIS — N2581 Secondary hyperparathyroidism of renal origin: Secondary | ICD-10-CM | POA: Diagnosis not present

## 2015-01-15 DIAGNOSIS — D509 Iron deficiency anemia, unspecified: Secondary | ICD-10-CM | POA: Diagnosis not present

## 2015-01-15 DIAGNOSIS — Z992 Dependence on renal dialysis: Secondary | ICD-10-CM | POA: Diagnosis not present

## 2015-01-15 DIAGNOSIS — N186 End stage renal disease: Secondary | ICD-10-CM | POA: Diagnosis not present

## 2015-01-15 DIAGNOSIS — D631 Anemia in chronic kidney disease: Secondary | ICD-10-CM | POA: Diagnosis not present

## 2015-01-16 DIAGNOSIS — D510 Vitamin B12 deficiency anemia due to intrinsic factor deficiency: Secondary | ICD-10-CM | POA: Diagnosis not present

## 2015-01-16 DIAGNOSIS — E118 Type 2 diabetes mellitus with unspecified complications: Secondary | ICD-10-CM | POA: Diagnosis not present

## 2015-01-16 DIAGNOSIS — N186 End stage renal disease: Secondary | ICD-10-CM | POA: Diagnosis not present

## 2015-01-17 DIAGNOSIS — D631 Anemia in chronic kidney disease: Secondary | ICD-10-CM | POA: Diagnosis not present

## 2015-01-17 DIAGNOSIS — D509 Iron deficiency anemia, unspecified: Secondary | ICD-10-CM | POA: Diagnosis not present

## 2015-01-17 DIAGNOSIS — N186 End stage renal disease: Secondary | ICD-10-CM | POA: Diagnosis not present

## 2015-01-17 DIAGNOSIS — N2581 Secondary hyperparathyroidism of renal origin: Secondary | ICD-10-CM | POA: Diagnosis not present

## 2015-01-17 DIAGNOSIS — Z992 Dependence on renal dialysis: Secondary | ICD-10-CM | POA: Diagnosis not present

## 2015-01-18 ENCOUNTER — Encounter: Payer: Self-pay | Admitting: Internal Medicine

## 2015-01-19 ENCOUNTER — Ambulatory Visit (INDEPENDENT_AMBULATORY_CARE_PROVIDER_SITE_OTHER): Payer: Medicare Other | Admitting: *Deleted

## 2015-01-19 DIAGNOSIS — I6340 Cerebral infarction due to embolism of unspecified cerebral artery: Secondary | ICD-10-CM | POA: Diagnosis not present

## 2015-01-19 DIAGNOSIS — N2581 Secondary hyperparathyroidism of renal origin: Secondary | ICD-10-CM | POA: Diagnosis not present

## 2015-01-19 DIAGNOSIS — D509 Iron deficiency anemia, unspecified: Secondary | ICD-10-CM | POA: Diagnosis not present

## 2015-01-19 DIAGNOSIS — Z992 Dependence on renal dialysis: Secondary | ICD-10-CM | POA: Diagnosis not present

## 2015-01-19 DIAGNOSIS — N186 End stage renal disease: Secondary | ICD-10-CM | POA: Diagnosis not present

## 2015-01-19 DIAGNOSIS — D631 Anemia in chronic kidney disease: Secondary | ICD-10-CM | POA: Diagnosis not present

## 2015-01-22 DIAGNOSIS — N2581 Secondary hyperparathyroidism of renal origin: Secondary | ICD-10-CM | POA: Diagnosis not present

## 2015-01-22 DIAGNOSIS — N186 End stage renal disease: Secondary | ICD-10-CM | POA: Diagnosis not present

## 2015-01-22 DIAGNOSIS — D631 Anemia in chronic kidney disease: Secondary | ICD-10-CM | POA: Diagnosis not present

## 2015-01-22 DIAGNOSIS — Z992 Dependence on renal dialysis: Secondary | ICD-10-CM | POA: Diagnosis not present

## 2015-01-22 DIAGNOSIS — D509 Iron deficiency anemia, unspecified: Secondary | ICD-10-CM | POA: Diagnosis not present

## 2015-01-23 NOTE — Progress Notes (Signed)
Loop recorder 

## 2015-01-24 DIAGNOSIS — D509 Iron deficiency anemia, unspecified: Secondary | ICD-10-CM | POA: Diagnosis not present

## 2015-01-24 DIAGNOSIS — N2581 Secondary hyperparathyroidism of renal origin: Secondary | ICD-10-CM | POA: Diagnosis not present

## 2015-01-24 DIAGNOSIS — D631 Anemia in chronic kidney disease: Secondary | ICD-10-CM | POA: Diagnosis not present

## 2015-01-24 DIAGNOSIS — N186 End stage renal disease: Secondary | ICD-10-CM | POA: Diagnosis not present

## 2015-01-24 DIAGNOSIS — Z992 Dependence on renal dialysis: Secondary | ICD-10-CM | POA: Diagnosis not present

## 2015-01-26 DIAGNOSIS — D631 Anemia in chronic kidney disease: Secondary | ICD-10-CM | POA: Diagnosis not present

## 2015-01-26 DIAGNOSIS — N186 End stage renal disease: Secondary | ICD-10-CM | POA: Diagnosis not present

## 2015-01-26 DIAGNOSIS — D509 Iron deficiency anemia, unspecified: Secondary | ICD-10-CM | POA: Diagnosis not present

## 2015-01-26 DIAGNOSIS — Z992 Dependence on renal dialysis: Secondary | ICD-10-CM | POA: Diagnosis not present

## 2015-01-26 DIAGNOSIS — N2581 Secondary hyperparathyroidism of renal origin: Secondary | ICD-10-CM | POA: Diagnosis not present

## 2015-01-29 DIAGNOSIS — N186 End stage renal disease: Secondary | ICD-10-CM | POA: Diagnosis not present

## 2015-01-29 DIAGNOSIS — Z992 Dependence on renal dialysis: Secondary | ICD-10-CM | POA: Diagnosis not present

## 2015-01-29 DIAGNOSIS — D509 Iron deficiency anemia, unspecified: Secondary | ICD-10-CM | POA: Diagnosis not present

## 2015-01-29 DIAGNOSIS — N2581 Secondary hyperparathyroidism of renal origin: Secondary | ICD-10-CM | POA: Diagnosis not present

## 2015-01-29 DIAGNOSIS — D631 Anemia in chronic kidney disease: Secondary | ICD-10-CM | POA: Diagnosis not present

## 2015-01-29 LAB — CUP PACEART REMOTE DEVICE CHECK: MDC IDC SESS DTM: 20160815081942

## 2015-01-31 DIAGNOSIS — N186 End stage renal disease: Secondary | ICD-10-CM | POA: Diagnosis not present

## 2015-01-31 DIAGNOSIS — D509 Iron deficiency anemia, unspecified: Secondary | ICD-10-CM | POA: Diagnosis not present

## 2015-01-31 DIAGNOSIS — N2581 Secondary hyperparathyroidism of renal origin: Secondary | ICD-10-CM | POA: Diagnosis not present

## 2015-01-31 DIAGNOSIS — D631 Anemia in chronic kidney disease: Secondary | ICD-10-CM | POA: Diagnosis not present

## 2015-01-31 DIAGNOSIS — Z992 Dependence on renal dialysis: Secondary | ICD-10-CM | POA: Diagnosis not present

## 2015-02-02 DIAGNOSIS — N2581 Secondary hyperparathyroidism of renal origin: Secondary | ICD-10-CM | POA: Diagnosis not present

## 2015-02-02 DIAGNOSIS — Z992 Dependence on renal dialysis: Secondary | ICD-10-CM | POA: Diagnosis not present

## 2015-02-02 DIAGNOSIS — N186 End stage renal disease: Secondary | ICD-10-CM | POA: Diagnosis not present

## 2015-02-02 DIAGNOSIS — D509 Iron deficiency anemia, unspecified: Secondary | ICD-10-CM | POA: Diagnosis not present

## 2015-02-02 DIAGNOSIS — D631 Anemia in chronic kidney disease: Secondary | ICD-10-CM | POA: Diagnosis not present

## 2015-02-05 DIAGNOSIS — D631 Anemia in chronic kidney disease: Secondary | ICD-10-CM | POA: Diagnosis not present

## 2015-02-05 DIAGNOSIS — N186 End stage renal disease: Secondary | ICD-10-CM | POA: Diagnosis not present

## 2015-02-05 DIAGNOSIS — N2581 Secondary hyperparathyroidism of renal origin: Secondary | ICD-10-CM | POA: Diagnosis not present

## 2015-02-05 DIAGNOSIS — Z992 Dependence on renal dialysis: Secondary | ICD-10-CM | POA: Diagnosis not present

## 2015-02-05 DIAGNOSIS — D509 Iron deficiency anemia, unspecified: Secondary | ICD-10-CM | POA: Diagnosis not present

## 2015-02-07 DIAGNOSIS — D631 Anemia in chronic kidney disease: Secondary | ICD-10-CM | POA: Diagnosis not present

## 2015-02-07 DIAGNOSIS — N186 End stage renal disease: Secondary | ICD-10-CM | POA: Diagnosis not present

## 2015-02-07 DIAGNOSIS — D509 Iron deficiency anemia, unspecified: Secondary | ICD-10-CM | POA: Diagnosis not present

## 2015-02-07 DIAGNOSIS — N2581 Secondary hyperparathyroidism of renal origin: Secondary | ICD-10-CM | POA: Diagnosis not present

## 2015-02-07 DIAGNOSIS — Z992 Dependence on renal dialysis: Secondary | ICD-10-CM | POA: Diagnosis not present

## 2015-02-09 DIAGNOSIS — Z992 Dependence on renal dialysis: Secondary | ICD-10-CM | POA: Diagnosis not present

## 2015-02-09 DIAGNOSIS — D509 Iron deficiency anemia, unspecified: Secondary | ICD-10-CM | POA: Diagnosis not present

## 2015-02-09 DIAGNOSIS — N2581 Secondary hyperparathyroidism of renal origin: Secondary | ICD-10-CM | POA: Diagnosis not present

## 2015-02-09 DIAGNOSIS — N186 End stage renal disease: Secondary | ICD-10-CM | POA: Diagnosis not present

## 2015-02-09 DIAGNOSIS — D631 Anemia in chronic kidney disease: Secondary | ICD-10-CM | POA: Diagnosis not present

## 2015-02-12 DIAGNOSIS — D509 Iron deficiency anemia, unspecified: Secondary | ICD-10-CM | POA: Diagnosis not present

## 2015-02-12 DIAGNOSIS — N2581 Secondary hyperparathyroidism of renal origin: Secondary | ICD-10-CM | POA: Diagnosis not present

## 2015-02-12 DIAGNOSIS — N186 End stage renal disease: Secondary | ICD-10-CM | POA: Diagnosis not present

## 2015-02-12 DIAGNOSIS — Z992 Dependence on renal dialysis: Secondary | ICD-10-CM | POA: Diagnosis not present

## 2015-02-12 DIAGNOSIS — D631 Anemia in chronic kidney disease: Secondary | ICD-10-CM | POA: Diagnosis not present

## 2015-02-14 ENCOUNTER — Encounter: Payer: Self-pay | Admitting: Internal Medicine

## 2015-02-14 DIAGNOSIS — N186 End stage renal disease: Secondary | ICD-10-CM | POA: Diagnosis not present

## 2015-02-14 DIAGNOSIS — D631 Anemia in chronic kidney disease: Secondary | ICD-10-CM | POA: Diagnosis not present

## 2015-02-14 DIAGNOSIS — D510 Vitamin B12 deficiency anemia due to intrinsic factor deficiency: Secondary | ICD-10-CM | POA: Diagnosis not present

## 2015-02-14 DIAGNOSIS — Z992 Dependence on renal dialysis: Secondary | ICD-10-CM | POA: Diagnosis not present

## 2015-02-14 DIAGNOSIS — D509 Iron deficiency anemia, unspecified: Secondary | ICD-10-CM | POA: Diagnosis not present

## 2015-02-14 DIAGNOSIS — N2581 Secondary hyperparathyroidism of renal origin: Secondary | ICD-10-CM | POA: Diagnosis not present

## 2015-02-16 DIAGNOSIS — N2581 Secondary hyperparathyroidism of renal origin: Secondary | ICD-10-CM | POA: Diagnosis not present

## 2015-02-16 DIAGNOSIS — D509 Iron deficiency anemia, unspecified: Secondary | ICD-10-CM | POA: Diagnosis not present

## 2015-02-16 DIAGNOSIS — D631 Anemia in chronic kidney disease: Secondary | ICD-10-CM | POA: Diagnosis not present

## 2015-02-16 DIAGNOSIS — Z992 Dependence on renal dialysis: Secondary | ICD-10-CM | POA: Diagnosis not present

## 2015-02-16 DIAGNOSIS — N186 End stage renal disease: Secondary | ICD-10-CM | POA: Diagnosis not present

## 2015-02-19 DIAGNOSIS — D509 Iron deficiency anemia, unspecified: Secondary | ICD-10-CM | POA: Diagnosis not present

## 2015-02-19 DIAGNOSIS — N186 End stage renal disease: Secondary | ICD-10-CM | POA: Diagnosis not present

## 2015-02-19 DIAGNOSIS — Z992 Dependence on renal dialysis: Secondary | ICD-10-CM | POA: Diagnosis not present

## 2015-02-19 DIAGNOSIS — N2581 Secondary hyperparathyroidism of renal origin: Secondary | ICD-10-CM | POA: Diagnosis not present

## 2015-02-19 DIAGNOSIS — D631 Anemia in chronic kidney disease: Secondary | ICD-10-CM | POA: Diagnosis not present

## 2015-02-20 ENCOUNTER — Ambulatory Visit (INDEPENDENT_AMBULATORY_CARE_PROVIDER_SITE_OTHER): Payer: Medicare Other | Admitting: *Deleted

## 2015-02-20 DIAGNOSIS — I6340 Cerebral infarction due to embolism of unspecified cerebral artery: Secondary | ICD-10-CM

## 2015-02-21 DIAGNOSIS — D509 Iron deficiency anemia, unspecified: Secondary | ICD-10-CM | POA: Diagnosis not present

## 2015-02-21 DIAGNOSIS — Z992 Dependence on renal dialysis: Secondary | ICD-10-CM | POA: Diagnosis not present

## 2015-02-21 DIAGNOSIS — N186 End stage renal disease: Secondary | ICD-10-CM | POA: Diagnosis not present

## 2015-02-21 DIAGNOSIS — D631 Anemia in chronic kidney disease: Secondary | ICD-10-CM | POA: Diagnosis not present

## 2015-02-21 DIAGNOSIS — N2581 Secondary hyperparathyroidism of renal origin: Secondary | ICD-10-CM | POA: Diagnosis not present

## 2015-02-21 NOTE — Progress Notes (Signed)
Loop recorder 

## 2015-02-23 DIAGNOSIS — N186 End stage renal disease: Secondary | ICD-10-CM | POA: Diagnosis not present

## 2015-02-23 DIAGNOSIS — Z992 Dependence on renal dialysis: Secondary | ICD-10-CM | POA: Diagnosis not present

## 2015-02-23 DIAGNOSIS — D509 Iron deficiency anemia, unspecified: Secondary | ICD-10-CM | POA: Diagnosis not present

## 2015-02-23 DIAGNOSIS — N2581 Secondary hyperparathyroidism of renal origin: Secondary | ICD-10-CM | POA: Diagnosis not present

## 2015-02-23 DIAGNOSIS — D631 Anemia in chronic kidney disease: Secondary | ICD-10-CM | POA: Diagnosis not present

## 2015-02-26 DIAGNOSIS — D509 Iron deficiency anemia, unspecified: Secondary | ICD-10-CM | POA: Diagnosis not present

## 2015-02-26 DIAGNOSIS — N2581 Secondary hyperparathyroidism of renal origin: Secondary | ICD-10-CM | POA: Diagnosis not present

## 2015-02-26 DIAGNOSIS — Z992 Dependence on renal dialysis: Secondary | ICD-10-CM | POA: Diagnosis not present

## 2015-02-26 DIAGNOSIS — D631 Anemia in chronic kidney disease: Secondary | ICD-10-CM | POA: Diagnosis not present

## 2015-02-26 DIAGNOSIS — N186 End stage renal disease: Secondary | ICD-10-CM | POA: Diagnosis not present

## 2015-02-28 DIAGNOSIS — N2581 Secondary hyperparathyroidism of renal origin: Secondary | ICD-10-CM | POA: Diagnosis not present

## 2015-02-28 DIAGNOSIS — D631 Anemia in chronic kidney disease: Secondary | ICD-10-CM | POA: Diagnosis not present

## 2015-02-28 DIAGNOSIS — D509 Iron deficiency anemia, unspecified: Secondary | ICD-10-CM | POA: Diagnosis not present

## 2015-02-28 DIAGNOSIS — Z992 Dependence on renal dialysis: Secondary | ICD-10-CM | POA: Diagnosis not present

## 2015-02-28 DIAGNOSIS — N186 End stage renal disease: Secondary | ICD-10-CM | POA: Diagnosis not present

## 2015-03-02 DIAGNOSIS — Z992 Dependence on renal dialysis: Secondary | ICD-10-CM | POA: Diagnosis not present

## 2015-03-02 DIAGNOSIS — N2581 Secondary hyperparathyroidism of renal origin: Secondary | ICD-10-CM | POA: Diagnosis not present

## 2015-03-02 DIAGNOSIS — D509 Iron deficiency anemia, unspecified: Secondary | ICD-10-CM | POA: Diagnosis not present

## 2015-03-02 DIAGNOSIS — N186 End stage renal disease: Secondary | ICD-10-CM | POA: Diagnosis not present

## 2015-03-02 DIAGNOSIS — D631 Anemia in chronic kidney disease: Secondary | ICD-10-CM | POA: Diagnosis not present

## 2015-03-03 LAB — CUP PACEART REMOTE DEVICE CHECK: MDC IDC SESS DTM: 20160917150115

## 2015-03-03 NOTE — Progress Notes (Signed)
Carelink summary report received. Battery status OK. Normal device function. No new symptom episodes, tachy episodes, brady, or pause episodes. No new AF episodes. Monthly summary reports and ROV with GT PRN.

## 2015-03-05 DIAGNOSIS — N2581 Secondary hyperparathyroidism of renal origin: Secondary | ICD-10-CM | POA: Diagnosis not present

## 2015-03-05 DIAGNOSIS — Z992 Dependence on renal dialysis: Secondary | ICD-10-CM | POA: Diagnosis not present

## 2015-03-05 DIAGNOSIS — D509 Iron deficiency anemia, unspecified: Secondary | ICD-10-CM | POA: Diagnosis not present

## 2015-03-05 DIAGNOSIS — N186 End stage renal disease: Secondary | ICD-10-CM | POA: Diagnosis not present

## 2015-03-05 DIAGNOSIS — D631 Anemia in chronic kidney disease: Secondary | ICD-10-CM | POA: Diagnosis not present

## 2015-03-07 DIAGNOSIS — D631 Anemia in chronic kidney disease: Secondary | ICD-10-CM | POA: Diagnosis not present

## 2015-03-07 DIAGNOSIS — N2581 Secondary hyperparathyroidism of renal origin: Secondary | ICD-10-CM | POA: Diagnosis not present

## 2015-03-07 DIAGNOSIS — D509 Iron deficiency anemia, unspecified: Secondary | ICD-10-CM | POA: Diagnosis not present

## 2015-03-07 DIAGNOSIS — Z992 Dependence on renal dialysis: Secondary | ICD-10-CM | POA: Diagnosis not present

## 2015-03-07 DIAGNOSIS — N186 End stage renal disease: Secondary | ICD-10-CM | POA: Diagnosis not present

## 2015-03-09 DIAGNOSIS — D509 Iron deficiency anemia, unspecified: Secondary | ICD-10-CM | POA: Diagnosis not present

## 2015-03-09 DIAGNOSIS — N2581 Secondary hyperparathyroidism of renal origin: Secondary | ICD-10-CM | POA: Diagnosis not present

## 2015-03-09 DIAGNOSIS — D631 Anemia in chronic kidney disease: Secondary | ICD-10-CM | POA: Diagnosis not present

## 2015-03-09 DIAGNOSIS — N186 End stage renal disease: Secondary | ICD-10-CM | POA: Diagnosis not present

## 2015-03-09 DIAGNOSIS — Z992 Dependence on renal dialysis: Secondary | ICD-10-CM | POA: Diagnosis not present

## 2015-03-12 DIAGNOSIS — N186 End stage renal disease: Secondary | ICD-10-CM | POA: Diagnosis not present

## 2015-03-12 DIAGNOSIS — D631 Anemia in chronic kidney disease: Secondary | ICD-10-CM | POA: Diagnosis not present

## 2015-03-12 DIAGNOSIS — D509 Iron deficiency anemia, unspecified: Secondary | ICD-10-CM | POA: Diagnosis not present

## 2015-03-12 DIAGNOSIS — N2581 Secondary hyperparathyroidism of renal origin: Secondary | ICD-10-CM | POA: Diagnosis not present

## 2015-03-12 DIAGNOSIS — Z992 Dependence on renal dialysis: Secondary | ICD-10-CM | POA: Diagnosis not present

## 2015-03-14 DIAGNOSIS — N186 End stage renal disease: Secondary | ICD-10-CM | POA: Diagnosis not present

## 2015-03-14 DIAGNOSIS — D509 Iron deficiency anemia, unspecified: Secondary | ICD-10-CM | POA: Diagnosis not present

## 2015-03-14 DIAGNOSIS — Z992 Dependence on renal dialysis: Secondary | ICD-10-CM | POA: Diagnosis not present

## 2015-03-14 DIAGNOSIS — N2581 Secondary hyperparathyroidism of renal origin: Secondary | ICD-10-CM | POA: Diagnosis not present

## 2015-03-14 DIAGNOSIS — D631 Anemia in chronic kidney disease: Secondary | ICD-10-CM | POA: Diagnosis not present

## 2015-03-16 DIAGNOSIS — N186 End stage renal disease: Secondary | ICD-10-CM | POA: Diagnosis not present

## 2015-03-16 DIAGNOSIS — Z992 Dependence on renal dialysis: Secondary | ICD-10-CM | POA: Diagnosis not present

## 2015-03-16 DIAGNOSIS — D510 Vitamin B12 deficiency anemia due to intrinsic factor deficiency: Secondary | ICD-10-CM | POA: Diagnosis not present

## 2015-03-16 DIAGNOSIS — N2581 Secondary hyperparathyroidism of renal origin: Secondary | ICD-10-CM | POA: Diagnosis not present

## 2015-03-16 DIAGNOSIS — D631 Anemia in chronic kidney disease: Secondary | ICD-10-CM | POA: Diagnosis not present

## 2015-03-16 DIAGNOSIS — D509 Iron deficiency anemia, unspecified: Secondary | ICD-10-CM | POA: Diagnosis not present

## 2015-03-19 DIAGNOSIS — Z992 Dependence on renal dialysis: Secondary | ICD-10-CM | POA: Diagnosis not present

## 2015-03-19 DIAGNOSIS — E8779 Other fluid overload: Secondary | ICD-10-CM | POA: Diagnosis not present

## 2015-03-19 DIAGNOSIS — D631 Anemia in chronic kidney disease: Secondary | ICD-10-CM | POA: Diagnosis not present

## 2015-03-19 DIAGNOSIS — D509 Iron deficiency anemia, unspecified: Secondary | ICD-10-CM | POA: Diagnosis not present

## 2015-03-19 DIAGNOSIS — N2581 Secondary hyperparathyroidism of renal origin: Secondary | ICD-10-CM | POA: Diagnosis not present

## 2015-03-19 DIAGNOSIS — N186 End stage renal disease: Secondary | ICD-10-CM | POA: Diagnosis not present

## 2015-03-21 ENCOUNTER — Ambulatory Visit (INDEPENDENT_AMBULATORY_CARE_PROVIDER_SITE_OTHER): Payer: Medicare Other | Admitting: *Deleted

## 2015-03-21 ENCOUNTER — Encounter: Payer: Self-pay | Admitting: Internal Medicine

## 2015-03-21 DIAGNOSIS — N2581 Secondary hyperparathyroidism of renal origin: Secondary | ICD-10-CM | POA: Diagnosis not present

## 2015-03-21 DIAGNOSIS — I6340 Cerebral infarction due to embolism of unspecified cerebral artery: Secondary | ICD-10-CM | POA: Diagnosis not present

## 2015-03-21 DIAGNOSIS — D509 Iron deficiency anemia, unspecified: Secondary | ICD-10-CM | POA: Diagnosis not present

## 2015-03-21 DIAGNOSIS — N186 End stage renal disease: Secondary | ICD-10-CM | POA: Diagnosis not present

## 2015-03-21 DIAGNOSIS — Z992 Dependence on renal dialysis: Secondary | ICD-10-CM | POA: Diagnosis not present

## 2015-03-21 DIAGNOSIS — D631 Anemia in chronic kidney disease: Secondary | ICD-10-CM | POA: Diagnosis not present

## 2015-03-21 DIAGNOSIS — E8779 Other fluid overload: Secondary | ICD-10-CM | POA: Diagnosis not present

## 2015-03-23 DIAGNOSIS — D509 Iron deficiency anemia, unspecified: Secondary | ICD-10-CM | POA: Diagnosis not present

## 2015-03-23 DIAGNOSIS — D631 Anemia in chronic kidney disease: Secondary | ICD-10-CM | POA: Diagnosis not present

## 2015-03-23 DIAGNOSIS — N186 End stage renal disease: Secondary | ICD-10-CM | POA: Diagnosis not present

## 2015-03-23 DIAGNOSIS — N2581 Secondary hyperparathyroidism of renal origin: Secondary | ICD-10-CM | POA: Diagnosis not present

## 2015-03-23 DIAGNOSIS — Z992 Dependence on renal dialysis: Secondary | ICD-10-CM | POA: Diagnosis not present

## 2015-03-23 DIAGNOSIS — E8779 Other fluid overload: Secondary | ICD-10-CM | POA: Diagnosis not present

## 2015-03-23 NOTE — Progress Notes (Signed)
Loop recorder 

## 2015-03-26 DIAGNOSIS — Z992 Dependence on renal dialysis: Secondary | ICD-10-CM | POA: Diagnosis not present

## 2015-03-26 DIAGNOSIS — N2581 Secondary hyperparathyroidism of renal origin: Secondary | ICD-10-CM | POA: Diagnosis not present

## 2015-03-26 DIAGNOSIS — D631 Anemia in chronic kidney disease: Secondary | ICD-10-CM | POA: Diagnosis not present

## 2015-03-26 DIAGNOSIS — E8779 Other fluid overload: Secondary | ICD-10-CM | POA: Diagnosis not present

## 2015-03-26 DIAGNOSIS — N186 End stage renal disease: Secondary | ICD-10-CM | POA: Diagnosis not present

## 2015-03-26 DIAGNOSIS — D509 Iron deficiency anemia, unspecified: Secondary | ICD-10-CM | POA: Diagnosis not present

## 2015-03-28 DIAGNOSIS — E8779 Other fluid overload: Secondary | ICD-10-CM | POA: Diagnosis not present

## 2015-03-28 DIAGNOSIS — N186 End stage renal disease: Secondary | ICD-10-CM | POA: Diagnosis not present

## 2015-03-28 DIAGNOSIS — D509 Iron deficiency anemia, unspecified: Secondary | ICD-10-CM | POA: Diagnosis not present

## 2015-03-28 DIAGNOSIS — D631 Anemia in chronic kidney disease: Secondary | ICD-10-CM | POA: Diagnosis not present

## 2015-03-28 DIAGNOSIS — N2581 Secondary hyperparathyroidism of renal origin: Secondary | ICD-10-CM | POA: Diagnosis not present

## 2015-03-28 DIAGNOSIS — Z992 Dependence on renal dialysis: Secondary | ICD-10-CM | POA: Diagnosis not present

## 2015-03-28 DIAGNOSIS — Z794 Long term (current) use of insulin: Secondary | ICD-10-CM | POA: Diagnosis not present

## 2015-03-28 DIAGNOSIS — E119 Type 2 diabetes mellitus without complications: Secondary | ICD-10-CM | POA: Diagnosis not present

## 2015-03-29 DIAGNOSIS — Z9849 Cataract extraction status, unspecified eye: Secondary | ICD-10-CM | POA: Diagnosis not present

## 2015-03-29 DIAGNOSIS — E119 Type 2 diabetes mellitus without complications: Secondary | ICD-10-CM | POA: Diagnosis not present

## 2015-03-29 DIAGNOSIS — E11319 Type 2 diabetes mellitus with unspecified diabetic retinopathy without macular edema: Secondary | ICD-10-CM | POA: Diagnosis not present

## 2015-03-29 DIAGNOSIS — Z961 Presence of intraocular lens: Secondary | ICD-10-CM | POA: Diagnosis not present

## 2015-03-30 DIAGNOSIS — N2581 Secondary hyperparathyroidism of renal origin: Secondary | ICD-10-CM | POA: Diagnosis not present

## 2015-03-30 DIAGNOSIS — N186 End stage renal disease: Secondary | ICD-10-CM | POA: Diagnosis not present

## 2015-03-30 DIAGNOSIS — Z992 Dependence on renal dialysis: Secondary | ICD-10-CM | POA: Diagnosis not present

## 2015-03-30 DIAGNOSIS — D631 Anemia in chronic kidney disease: Secondary | ICD-10-CM | POA: Diagnosis not present

## 2015-03-30 DIAGNOSIS — D509 Iron deficiency anemia, unspecified: Secondary | ICD-10-CM | POA: Diagnosis not present

## 2015-03-30 DIAGNOSIS — E8779 Other fluid overload: Secondary | ICD-10-CM | POA: Diagnosis not present

## 2015-04-02 DIAGNOSIS — N186 End stage renal disease: Secondary | ICD-10-CM | POA: Diagnosis not present

## 2015-04-02 DIAGNOSIS — D631 Anemia in chronic kidney disease: Secondary | ICD-10-CM | POA: Diagnosis not present

## 2015-04-02 DIAGNOSIS — N2581 Secondary hyperparathyroidism of renal origin: Secondary | ICD-10-CM | POA: Diagnosis not present

## 2015-04-02 DIAGNOSIS — E8779 Other fluid overload: Secondary | ICD-10-CM | POA: Diagnosis not present

## 2015-04-02 DIAGNOSIS — Z992 Dependence on renal dialysis: Secondary | ICD-10-CM | POA: Diagnosis not present

## 2015-04-02 DIAGNOSIS — D509 Iron deficiency anemia, unspecified: Secondary | ICD-10-CM | POA: Diagnosis not present

## 2015-04-03 DIAGNOSIS — D631 Anemia in chronic kidney disease: Secondary | ICD-10-CM | POA: Diagnosis not present

## 2015-04-03 DIAGNOSIS — D509 Iron deficiency anemia, unspecified: Secondary | ICD-10-CM | POA: Diagnosis not present

## 2015-04-03 DIAGNOSIS — N2581 Secondary hyperparathyroidism of renal origin: Secondary | ICD-10-CM | POA: Diagnosis not present

## 2015-04-03 DIAGNOSIS — Z992 Dependence on renal dialysis: Secondary | ICD-10-CM | POA: Diagnosis not present

## 2015-04-03 DIAGNOSIS — E8779 Other fluid overload: Secondary | ICD-10-CM | POA: Diagnosis not present

## 2015-04-03 DIAGNOSIS — N186 End stage renal disease: Secondary | ICD-10-CM | POA: Diagnosis not present

## 2015-04-03 LAB — CUP PACEART REMOTE DEVICE CHECK: MDC IDC SESS DTM: 20161006023557

## 2015-04-03 NOTE — Progress Notes (Signed)
Carelink summary report received. Battery status OK. Normal device function. No new symptom episodes, tachy episodes, brady, or pause episodes. No new AF episodes. Monthly summary reports and ROV with GT PRN.

## 2015-04-04 DIAGNOSIS — D631 Anemia in chronic kidney disease: Secondary | ICD-10-CM | POA: Diagnosis not present

## 2015-04-04 DIAGNOSIS — N186 End stage renal disease: Secondary | ICD-10-CM | POA: Diagnosis not present

## 2015-04-04 DIAGNOSIS — N2581 Secondary hyperparathyroidism of renal origin: Secondary | ICD-10-CM | POA: Diagnosis not present

## 2015-04-04 DIAGNOSIS — D509 Iron deficiency anemia, unspecified: Secondary | ICD-10-CM | POA: Diagnosis not present

## 2015-04-04 DIAGNOSIS — Z992 Dependence on renal dialysis: Secondary | ICD-10-CM | POA: Diagnosis not present

## 2015-04-04 DIAGNOSIS — E8779 Other fluid overload: Secondary | ICD-10-CM | POA: Diagnosis not present

## 2015-04-06 DIAGNOSIS — N2581 Secondary hyperparathyroidism of renal origin: Secondary | ICD-10-CM | POA: Diagnosis not present

## 2015-04-06 DIAGNOSIS — D631 Anemia in chronic kidney disease: Secondary | ICD-10-CM | POA: Diagnosis not present

## 2015-04-06 DIAGNOSIS — D509 Iron deficiency anemia, unspecified: Secondary | ICD-10-CM | POA: Diagnosis not present

## 2015-04-06 DIAGNOSIS — Z992 Dependence on renal dialysis: Secondary | ICD-10-CM | POA: Diagnosis not present

## 2015-04-06 DIAGNOSIS — N186 End stage renal disease: Secondary | ICD-10-CM | POA: Diagnosis not present

## 2015-04-06 DIAGNOSIS — E8779 Other fluid overload: Secondary | ICD-10-CM | POA: Diagnosis not present

## 2015-04-09 DIAGNOSIS — Z992 Dependence on renal dialysis: Secondary | ICD-10-CM | POA: Diagnosis not present

## 2015-04-09 DIAGNOSIS — E8779 Other fluid overload: Secondary | ICD-10-CM | POA: Diagnosis not present

## 2015-04-09 DIAGNOSIS — N2581 Secondary hyperparathyroidism of renal origin: Secondary | ICD-10-CM | POA: Diagnosis not present

## 2015-04-09 DIAGNOSIS — D631 Anemia in chronic kidney disease: Secondary | ICD-10-CM | POA: Diagnosis not present

## 2015-04-09 DIAGNOSIS — D509 Iron deficiency anemia, unspecified: Secondary | ICD-10-CM | POA: Diagnosis not present

## 2015-04-09 DIAGNOSIS — N186 End stage renal disease: Secondary | ICD-10-CM | POA: Diagnosis not present

## 2015-04-11 DIAGNOSIS — Z992 Dependence on renal dialysis: Secondary | ICD-10-CM | POA: Diagnosis not present

## 2015-04-11 DIAGNOSIS — N186 End stage renal disease: Secondary | ICD-10-CM | POA: Diagnosis not present

## 2015-04-11 DIAGNOSIS — N2581 Secondary hyperparathyroidism of renal origin: Secondary | ICD-10-CM | POA: Diagnosis not present

## 2015-04-11 DIAGNOSIS — D631 Anemia in chronic kidney disease: Secondary | ICD-10-CM | POA: Diagnosis not present

## 2015-04-11 DIAGNOSIS — D509 Iron deficiency anemia, unspecified: Secondary | ICD-10-CM | POA: Diagnosis not present

## 2015-04-11 DIAGNOSIS — E8779 Other fluid overload: Secondary | ICD-10-CM | POA: Diagnosis not present

## 2015-04-13 DIAGNOSIS — E8779 Other fluid overload: Secondary | ICD-10-CM | POA: Diagnosis not present

## 2015-04-13 DIAGNOSIS — D631 Anemia in chronic kidney disease: Secondary | ICD-10-CM | POA: Diagnosis not present

## 2015-04-13 DIAGNOSIS — Z992 Dependence on renal dialysis: Secondary | ICD-10-CM | POA: Diagnosis not present

## 2015-04-13 DIAGNOSIS — D509 Iron deficiency anemia, unspecified: Secondary | ICD-10-CM | POA: Diagnosis not present

## 2015-04-13 DIAGNOSIS — N2581 Secondary hyperparathyroidism of renal origin: Secondary | ICD-10-CM | POA: Diagnosis not present

## 2015-04-13 DIAGNOSIS — N186 End stage renal disease: Secondary | ICD-10-CM | POA: Diagnosis not present

## 2015-04-16 DIAGNOSIS — N186 End stage renal disease: Secondary | ICD-10-CM | POA: Diagnosis not present

## 2015-04-16 DIAGNOSIS — N2581 Secondary hyperparathyroidism of renal origin: Secondary | ICD-10-CM | POA: Diagnosis not present

## 2015-04-16 DIAGNOSIS — E8779 Other fluid overload: Secondary | ICD-10-CM | POA: Diagnosis not present

## 2015-04-16 DIAGNOSIS — Z992 Dependence on renal dialysis: Secondary | ICD-10-CM | POA: Diagnosis not present

## 2015-04-16 DIAGNOSIS — D631 Anemia in chronic kidney disease: Secondary | ICD-10-CM | POA: Diagnosis not present

## 2015-04-16 DIAGNOSIS — D509 Iron deficiency anemia, unspecified: Secondary | ICD-10-CM | POA: Diagnosis not present

## 2015-04-17 DIAGNOSIS — I10 Essential (primary) hypertension: Secondary | ICD-10-CM | POA: Diagnosis not present

## 2015-04-17 DIAGNOSIS — N186 End stage renal disease: Secondary | ICD-10-CM | POA: Diagnosis not present

## 2015-04-17 DIAGNOSIS — E1122 Type 2 diabetes mellitus with diabetic chronic kidney disease: Secondary | ICD-10-CM | POA: Diagnosis not present

## 2015-04-17 DIAGNOSIS — D510 Vitamin B12 deficiency anemia due to intrinsic factor deficiency: Secondary | ICD-10-CM | POA: Diagnosis not present

## 2015-04-18 ENCOUNTER — Encounter: Payer: Self-pay | Admitting: Internal Medicine

## 2015-04-18 DIAGNOSIS — D631 Anemia in chronic kidney disease: Secondary | ICD-10-CM | POA: Diagnosis not present

## 2015-04-18 DIAGNOSIS — N2581 Secondary hyperparathyroidism of renal origin: Secondary | ICD-10-CM | POA: Diagnosis not present

## 2015-04-18 DIAGNOSIS — D509 Iron deficiency anemia, unspecified: Secondary | ICD-10-CM | POA: Diagnosis not present

## 2015-04-18 DIAGNOSIS — N186 End stage renal disease: Secondary | ICD-10-CM | POA: Diagnosis not present

## 2015-04-18 DIAGNOSIS — Z992 Dependence on renal dialysis: Secondary | ICD-10-CM | POA: Diagnosis not present

## 2015-04-20 ENCOUNTER — Ambulatory Visit (INDEPENDENT_AMBULATORY_CARE_PROVIDER_SITE_OTHER): Payer: Medicare Other | Admitting: *Deleted

## 2015-04-20 DIAGNOSIS — D509 Iron deficiency anemia, unspecified: Secondary | ICD-10-CM | POA: Diagnosis not present

## 2015-04-20 DIAGNOSIS — Z992 Dependence on renal dialysis: Secondary | ICD-10-CM | POA: Diagnosis not present

## 2015-04-20 DIAGNOSIS — I6340 Cerebral infarction due to embolism of unspecified cerebral artery: Secondary | ICD-10-CM | POA: Diagnosis not present

## 2015-04-20 DIAGNOSIS — D631 Anemia in chronic kidney disease: Secondary | ICD-10-CM | POA: Diagnosis not present

## 2015-04-20 DIAGNOSIS — L851 Acquired keratosis [keratoderma] palmaris et plantaris: Secondary | ICD-10-CM | POA: Diagnosis not present

## 2015-04-20 DIAGNOSIS — L603 Nail dystrophy: Secondary | ICD-10-CM | POA: Diagnosis not present

## 2015-04-20 DIAGNOSIS — N186 End stage renal disease: Secondary | ICD-10-CM | POA: Diagnosis not present

## 2015-04-20 DIAGNOSIS — E1142 Type 2 diabetes mellitus with diabetic polyneuropathy: Secondary | ICD-10-CM | POA: Diagnosis not present

## 2015-04-20 DIAGNOSIS — N2581 Secondary hyperparathyroidism of renal origin: Secondary | ICD-10-CM | POA: Diagnosis not present

## 2015-04-23 DIAGNOSIS — N186 End stage renal disease: Secondary | ICD-10-CM | POA: Diagnosis not present

## 2015-04-23 DIAGNOSIS — Z992 Dependence on renal dialysis: Secondary | ICD-10-CM | POA: Diagnosis not present

## 2015-04-23 DIAGNOSIS — D509 Iron deficiency anemia, unspecified: Secondary | ICD-10-CM | POA: Diagnosis not present

## 2015-04-23 DIAGNOSIS — D631 Anemia in chronic kidney disease: Secondary | ICD-10-CM | POA: Diagnosis not present

## 2015-04-23 DIAGNOSIS — N2581 Secondary hyperparathyroidism of renal origin: Secondary | ICD-10-CM | POA: Diagnosis not present

## 2015-04-23 NOTE — Progress Notes (Signed)
Carelink Summary Report / Loop recorder 

## 2015-04-25 DIAGNOSIS — D509 Iron deficiency anemia, unspecified: Secondary | ICD-10-CM | POA: Diagnosis not present

## 2015-04-25 DIAGNOSIS — N2581 Secondary hyperparathyroidism of renal origin: Secondary | ICD-10-CM | POA: Diagnosis not present

## 2015-04-25 DIAGNOSIS — D631 Anemia in chronic kidney disease: Secondary | ICD-10-CM | POA: Diagnosis not present

## 2015-04-25 DIAGNOSIS — Z992 Dependence on renal dialysis: Secondary | ICD-10-CM | POA: Diagnosis not present

## 2015-04-25 DIAGNOSIS — N186 End stage renal disease: Secondary | ICD-10-CM | POA: Diagnosis not present

## 2015-04-27 DIAGNOSIS — D509 Iron deficiency anemia, unspecified: Secondary | ICD-10-CM | POA: Diagnosis not present

## 2015-04-27 DIAGNOSIS — D631 Anemia in chronic kidney disease: Secondary | ICD-10-CM | POA: Diagnosis not present

## 2015-04-27 DIAGNOSIS — N2581 Secondary hyperparathyroidism of renal origin: Secondary | ICD-10-CM | POA: Diagnosis not present

## 2015-04-27 DIAGNOSIS — Z992 Dependence on renal dialysis: Secondary | ICD-10-CM | POA: Diagnosis not present

## 2015-04-27 DIAGNOSIS — N186 End stage renal disease: Secondary | ICD-10-CM | POA: Diagnosis not present

## 2015-04-30 DIAGNOSIS — N2581 Secondary hyperparathyroidism of renal origin: Secondary | ICD-10-CM | POA: Diagnosis not present

## 2015-04-30 DIAGNOSIS — D631 Anemia in chronic kidney disease: Secondary | ICD-10-CM | POA: Diagnosis not present

## 2015-04-30 DIAGNOSIS — N186 End stage renal disease: Secondary | ICD-10-CM | POA: Diagnosis not present

## 2015-04-30 DIAGNOSIS — D509 Iron deficiency anemia, unspecified: Secondary | ICD-10-CM | POA: Diagnosis not present

## 2015-04-30 DIAGNOSIS — Z992 Dependence on renal dialysis: Secondary | ICD-10-CM | POA: Diagnosis not present

## 2015-05-01 DIAGNOSIS — E113593 Type 2 diabetes mellitus with proliferative diabetic retinopathy without macular edema, bilateral: Secondary | ICD-10-CM | POA: Diagnosis not present

## 2015-05-02 DIAGNOSIS — N186 End stage renal disease: Secondary | ICD-10-CM | POA: Diagnosis not present

## 2015-05-02 DIAGNOSIS — N2581 Secondary hyperparathyroidism of renal origin: Secondary | ICD-10-CM | POA: Diagnosis not present

## 2015-05-02 DIAGNOSIS — D631 Anemia in chronic kidney disease: Secondary | ICD-10-CM | POA: Diagnosis not present

## 2015-05-02 DIAGNOSIS — Z992 Dependence on renal dialysis: Secondary | ICD-10-CM | POA: Diagnosis not present

## 2015-05-02 DIAGNOSIS — D509 Iron deficiency anemia, unspecified: Secondary | ICD-10-CM | POA: Diagnosis not present

## 2015-05-04 DIAGNOSIS — Z992 Dependence on renal dialysis: Secondary | ICD-10-CM | POA: Diagnosis not present

## 2015-05-04 DIAGNOSIS — N186 End stage renal disease: Secondary | ICD-10-CM | POA: Diagnosis not present

## 2015-05-04 DIAGNOSIS — D631 Anemia in chronic kidney disease: Secondary | ICD-10-CM | POA: Diagnosis not present

## 2015-05-04 DIAGNOSIS — N2581 Secondary hyperparathyroidism of renal origin: Secondary | ICD-10-CM | POA: Diagnosis not present

## 2015-05-04 DIAGNOSIS — D509 Iron deficiency anemia, unspecified: Secondary | ICD-10-CM | POA: Diagnosis not present

## 2015-05-07 DIAGNOSIS — D509 Iron deficiency anemia, unspecified: Secondary | ICD-10-CM | POA: Diagnosis not present

## 2015-05-07 DIAGNOSIS — N2581 Secondary hyperparathyroidism of renal origin: Secondary | ICD-10-CM | POA: Diagnosis not present

## 2015-05-07 DIAGNOSIS — Z992 Dependence on renal dialysis: Secondary | ICD-10-CM | POA: Diagnosis not present

## 2015-05-07 DIAGNOSIS — N186 End stage renal disease: Secondary | ICD-10-CM | POA: Diagnosis not present

## 2015-05-07 DIAGNOSIS — D631 Anemia in chronic kidney disease: Secondary | ICD-10-CM | POA: Diagnosis not present

## 2015-05-09 DIAGNOSIS — N186 End stage renal disease: Secondary | ICD-10-CM | POA: Diagnosis not present

## 2015-05-09 DIAGNOSIS — N2581 Secondary hyperparathyroidism of renal origin: Secondary | ICD-10-CM | POA: Diagnosis not present

## 2015-05-09 DIAGNOSIS — D631 Anemia in chronic kidney disease: Secondary | ICD-10-CM | POA: Diagnosis not present

## 2015-05-09 DIAGNOSIS — D509 Iron deficiency anemia, unspecified: Secondary | ICD-10-CM | POA: Diagnosis not present

## 2015-05-09 DIAGNOSIS — Z992 Dependence on renal dialysis: Secondary | ICD-10-CM | POA: Diagnosis not present

## 2015-05-11 DIAGNOSIS — N186 End stage renal disease: Secondary | ICD-10-CM | POA: Diagnosis not present

## 2015-05-11 DIAGNOSIS — Z992 Dependence on renal dialysis: Secondary | ICD-10-CM | POA: Diagnosis not present

## 2015-05-11 DIAGNOSIS — D631 Anemia in chronic kidney disease: Secondary | ICD-10-CM | POA: Diagnosis not present

## 2015-05-11 DIAGNOSIS — N2581 Secondary hyperparathyroidism of renal origin: Secondary | ICD-10-CM | POA: Diagnosis not present

## 2015-05-11 DIAGNOSIS — D509 Iron deficiency anemia, unspecified: Secondary | ICD-10-CM | POA: Diagnosis not present

## 2015-05-14 DIAGNOSIS — N186 End stage renal disease: Secondary | ICD-10-CM | POA: Diagnosis not present

## 2015-05-14 DIAGNOSIS — D509 Iron deficiency anemia, unspecified: Secondary | ICD-10-CM | POA: Diagnosis not present

## 2015-05-14 DIAGNOSIS — D631 Anemia in chronic kidney disease: Secondary | ICD-10-CM | POA: Diagnosis not present

## 2015-05-14 DIAGNOSIS — Z992 Dependence on renal dialysis: Secondary | ICD-10-CM | POA: Diagnosis not present

## 2015-05-14 DIAGNOSIS — N2581 Secondary hyperparathyroidism of renal origin: Secondary | ICD-10-CM | POA: Diagnosis not present

## 2015-05-16 DIAGNOSIS — N2581 Secondary hyperparathyroidism of renal origin: Secondary | ICD-10-CM | POA: Diagnosis not present

## 2015-05-16 DIAGNOSIS — N186 End stage renal disease: Secondary | ICD-10-CM | POA: Diagnosis not present

## 2015-05-16 DIAGNOSIS — Z992 Dependence on renal dialysis: Secondary | ICD-10-CM | POA: Diagnosis not present

## 2015-05-16 DIAGNOSIS — D510 Vitamin B12 deficiency anemia due to intrinsic factor deficiency: Secondary | ICD-10-CM | POA: Diagnosis not present

## 2015-05-16 DIAGNOSIS — D631 Anemia in chronic kidney disease: Secondary | ICD-10-CM | POA: Diagnosis not present

## 2015-05-16 DIAGNOSIS — D509 Iron deficiency anemia, unspecified: Secondary | ICD-10-CM | POA: Diagnosis not present

## 2015-05-18 DIAGNOSIS — N2581 Secondary hyperparathyroidism of renal origin: Secondary | ICD-10-CM | POA: Diagnosis not present

## 2015-05-18 DIAGNOSIS — Z992 Dependence on renal dialysis: Secondary | ICD-10-CM | POA: Diagnosis not present

## 2015-05-18 DIAGNOSIS — N186 End stage renal disease: Secondary | ICD-10-CM | POA: Diagnosis not present

## 2015-05-18 DIAGNOSIS — D631 Anemia in chronic kidney disease: Secondary | ICD-10-CM | POA: Diagnosis not present

## 2015-05-18 DIAGNOSIS — D509 Iron deficiency anemia, unspecified: Secondary | ICD-10-CM | POA: Diagnosis not present

## 2015-05-20 LAB — CUP PACEART REMOTE DEVICE CHECK: Date Time Interrogation Session: 20161105023634

## 2015-05-20 NOTE — Progress Notes (Signed)
Carelink summary report received. Battery status OK. Normal device function. No new symptom episodes, tachy episodes, brady, or pause episodes. No new AF episodes. Monthly summary reports and ROV with GT PRN.

## 2015-05-21 ENCOUNTER — Ambulatory Visit (INDEPENDENT_AMBULATORY_CARE_PROVIDER_SITE_OTHER): Payer: Medicare Other | Admitting: *Deleted

## 2015-05-21 DIAGNOSIS — I6340 Cerebral infarction due to embolism of unspecified cerebral artery: Secondary | ICD-10-CM

## 2015-05-21 DIAGNOSIS — N186 End stage renal disease: Secondary | ICD-10-CM | POA: Diagnosis not present

## 2015-05-21 DIAGNOSIS — N2581 Secondary hyperparathyroidism of renal origin: Secondary | ICD-10-CM | POA: Diagnosis not present

## 2015-05-21 DIAGNOSIS — Z992 Dependence on renal dialysis: Secondary | ICD-10-CM | POA: Diagnosis not present

## 2015-05-21 DIAGNOSIS — D631 Anemia in chronic kidney disease: Secondary | ICD-10-CM | POA: Diagnosis not present

## 2015-05-21 DIAGNOSIS — D509 Iron deficiency anemia, unspecified: Secondary | ICD-10-CM | POA: Diagnosis not present

## 2015-05-21 NOTE — Progress Notes (Signed)
Carelink Summary Report / Loop Recorder 

## 2015-05-23 DIAGNOSIS — Z992 Dependence on renal dialysis: Secondary | ICD-10-CM | POA: Diagnosis not present

## 2015-05-23 DIAGNOSIS — D631 Anemia in chronic kidney disease: Secondary | ICD-10-CM | POA: Diagnosis not present

## 2015-05-23 DIAGNOSIS — N2581 Secondary hyperparathyroidism of renal origin: Secondary | ICD-10-CM | POA: Diagnosis not present

## 2015-05-23 DIAGNOSIS — D509 Iron deficiency anemia, unspecified: Secondary | ICD-10-CM | POA: Diagnosis not present

## 2015-05-23 DIAGNOSIS — N186 End stage renal disease: Secondary | ICD-10-CM | POA: Diagnosis not present

## 2015-05-25 DIAGNOSIS — N2581 Secondary hyperparathyroidism of renal origin: Secondary | ICD-10-CM | POA: Diagnosis not present

## 2015-05-25 DIAGNOSIS — Z992 Dependence on renal dialysis: Secondary | ICD-10-CM | POA: Diagnosis not present

## 2015-05-25 DIAGNOSIS — D631 Anemia in chronic kidney disease: Secondary | ICD-10-CM | POA: Diagnosis not present

## 2015-05-25 DIAGNOSIS — D509 Iron deficiency anemia, unspecified: Secondary | ICD-10-CM | POA: Diagnosis not present

## 2015-05-25 DIAGNOSIS — N186 End stage renal disease: Secondary | ICD-10-CM | POA: Diagnosis not present

## 2015-05-28 DIAGNOSIS — D631 Anemia in chronic kidney disease: Secondary | ICD-10-CM | POA: Diagnosis not present

## 2015-05-28 DIAGNOSIS — D509 Iron deficiency anemia, unspecified: Secondary | ICD-10-CM | POA: Diagnosis not present

## 2015-05-28 DIAGNOSIS — Z992 Dependence on renal dialysis: Secondary | ICD-10-CM | POA: Diagnosis not present

## 2015-05-28 DIAGNOSIS — N2581 Secondary hyperparathyroidism of renal origin: Secondary | ICD-10-CM | POA: Diagnosis not present

## 2015-05-28 DIAGNOSIS — N186 End stage renal disease: Secondary | ICD-10-CM | POA: Diagnosis not present

## 2015-05-29 ENCOUNTER — Inpatient Hospital Stay (HOSPITAL_COMMUNITY)
Admission: EM | Admit: 2015-05-29 | Discharge: 2015-06-03 | DRG: 193 | Disposition: A | Discharge status: Home / Self Care | Payer: Medicare Other | Attending: Internal Medicine | Admitting: Internal Medicine

## 2015-05-29 ENCOUNTER — Emergency Department (HOSPITAL_COMMUNITY): Payer: Medicare Other

## 2015-05-29 ENCOUNTER — Encounter (HOSPITAL_COMMUNITY): Payer: Self-pay | Admitting: Neurology

## 2015-05-29 DIAGNOSIS — Y95 Nosocomial condition: Secondary | ICD-10-CM | POA: Diagnosis present

## 2015-05-29 DIAGNOSIS — E119 Type 2 diabetes mellitus without complications: Secondary | ICD-10-CM | POA: Diagnosis not present

## 2015-05-29 DIAGNOSIS — N250 Renal osteodystrophy: Secondary | ICD-10-CM | POA: Diagnosis not present

## 2015-05-29 DIAGNOSIS — Z794 Long term (current) use of insulin: Secondary | ICD-10-CM | POA: Diagnosis not present

## 2015-05-29 DIAGNOSIS — R05 Cough: Secondary | ICD-10-CM | POA: Diagnosis not present

## 2015-05-29 DIAGNOSIS — Z992 Dependence on renal dialysis: Secondary | ICD-10-CM

## 2015-05-29 DIAGNOSIS — I120 Hypertensive chronic kidney disease with stage 5 chronic kidney disease or end stage renal disease: Secondary | ICD-10-CM | POA: Diagnosis not present

## 2015-05-29 DIAGNOSIS — D72829 Elevated white blood cell count, unspecified: Secondary | ICD-10-CM | POA: Diagnosis present

## 2015-05-29 DIAGNOSIS — R509 Fever, unspecified: Secondary | ICD-10-CM | POA: Diagnosis not present

## 2015-05-29 DIAGNOSIS — K219 Gastro-esophageal reflux disease without esophagitis: Secondary | ICD-10-CM | POA: Diagnosis not present

## 2015-05-29 DIAGNOSIS — E875 Hyperkalemia: Secondary | ICD-10-CM | POA: Diagnosis not present

## 2015-05-29 DIAGNOSIS — E1122 Type 2 diabetes mellitus with diabetic chronic kidney disease: Secondary | ICD-10-CM | POA: Diagnosis present

## 2015-05-29 DIAGNOSIS — N186 End stage renal disease: Secondary | ICD-10-CM | POA: Diagnosis not present

## 2015-05-29 DIAGNOSIS — D631 Anemia in chronic kidney disease: Secondary | ICD-10-CM | POA: Diagnosis not present

## 2015-05-29 DIAGNOSIS — Z8673 Personal history of transient ischemic attack (TIA), and cerebral infarction without residual deficits: Secondary | ICD-10-CM

## 2015-05-29 DIAGNOSIS — J189 Pneumonia, unspecified organism: Principal | ICD-10-CM | POA: Diagnosis present

## 2015-05-29 DIAGNOSIS — E118 Type 2 diabetes mellitus with unspecified complications: Secondary | ICD-10-CM | POA: Diagnosis not present

## 2015-05-29 DIAGNOSIS — Z888 Allergy status to other drugs, medicaments and biological substances status: Secondary | ICD-10-CM

## 2015-05-29 DIAGNOSIS — I10 Essential (primary) hypertension: Secondary | ICD-10-CM | POA: Diagnosis not present

## 2015-05-29 DIAGNOSIS — E1159 Type 2 diabetes mellitus with other circulatory complications: Secondary | ICD-10-CM | POA: Diagnosis present

## 2015-05-29 DIAGNOSIS — R0602 Shortness of breath: Secondary | ICD-10-CM | POA: Diagnosis not present

## 2015-05-29 LAB — I-STAT CG4 LACTIC ACID, ED
Lactic Acid, Venous: 1.68 mmol/L (ref 0.5–2.0)
Lactic Acid, Venous: 1.5 mmol/L (ref 0.5–2.0)

## 2015-05-29 LAB — CBC
HEMATOCRIT: 37.7 % (ref 36.0–46.0)
Hemoglobin: 12 g/dL (ref 12.0–15.0)
MCH: 30.9 pg (ref 26.0–34.0)
MCHC: 31.8 g/dL (ref 30.0–36.0)
MCV: 97.2 fL (ref 78.0–100.0)
Platelets: 121 10*3/uL — ABNORMAL LOW (ref 150–400)
RBC: 3.88 MIL/uL (ref 3.87–5.11)
RDW: 13.2 % (ref 11.5–15.5)
WBC: 17.8 10*3/uL — AB (ref 4.0–10.5)

## 2015-05-29 LAB — GLUCOSE, CAPILLARY: Glucose-Capillary: 309 mg/dL — ABNORMAL HIGH (ref 65–99)

## 2015-05-29 LAB — BASIC METABOLIC PANEL
Anion gap: 14 (ref 5–15)
BUN: 40 mg/dL — ABNORMAL HIGH (ref 6–20)
CHLORIDE: 98 mmol/L — AB (ref 101–111)
CO2: 24 mmol/L (ref 22–32)
Calcium: 8.5 mg/dL — ABNORMAL LOW (ref 8.9–10.3)
Creatinine, Ser: 8.43 mg/dL — ABNORMAL HIGH (ref 0.44–1.00)
GFR calc non Af Amer: 4 mL/min — ABNORMAL LOW (ref >60)
GFR, EST AFRICAN AMERICAN: 5 mL/min — AB (ref >60)
Glucose, Bld: 312 mg/dL — ABNORMAL HIGH (ref 65–99)
POTASSIUM: 5.7 mmol/L — AB (ref 3.5–5.1)
SODIUM: 136 mmol/L (ref 135–145)

## 2015-05-29 LAB — I-STAT TROPONIN, ED: Troponin i, poc: 0.09 ng/mL (ref 0.00–0.08)

## 2015-05-29 MED ORDER — SODIUM CHLORIDE 0.9 % IV SOLN
1750.0000 mg | Freq: Once | INTRAVENOUS | Status: AC
  Administered 2015-05-29: 1750 mg via INTRAVENOUS
  Filled 2015-05-29: qty 1750

## 2015-05-29 MED ORDER — HEPARIN SODIUM (PORCINE) 5000 UNIT/ML IJ SOLN
5000.0000 [IU] | Freq: Three times a day (TID) | INTRAMUSCULAR | Status: DC
  Administered 2015-05-29 – 2015-06-03 (×13): 5000 [IU] via SUBCUTANEOUS
  Filled 2015-05-29 (×11): qty 1

## 2015-05-29 MED ORDER — IPRATROPIUM-ALBUTEROL 0.5-2.5 (3) MG/3ML IN SOLN
3.0000 mL | Freq: Once | RESPIRATORY_TRACT | Status: AC
Start: 2015-05-29 — End: 2015-05-29
  Administered 2015-05-29: 3 mL via RESPIRATORY_TRACT
  Filled 2015-05-29: qty 3

## 2015-05-29 MED ORDER — DEXTROSE 5 % IV SOLN
1.0000 g | Freq: Once | INTRAVENOUS | Status: AC
  Administered 2015-05-29: 1 g via INTRAVENOUS
  Filled 2015-05-29: qty 1

## 2015-05-29 MED ORDER — ACETAMINOPHEN 325 MG PO TABS
ORAL_TABLET | ORAL | Status: AC
  Filled 2015-05-29: qty 2

## 2015-05-29 MED ORDER — ACETAMINOPHEN 325 MG PO TABS
650.0000 mg | ORAL_TABLET | Freq: Once | ORAL | Status: AC | PRN
  Administered 2015-05-29: 650 mg via ORAL

## 2015-05-29 MED ORDER — ATORVASTATIN CALCIUM 20 MG PO TABS
20.0000 mg | ORAL_TABLET | Freq: Every day | ORAL | Status: DC
  Administered 2015-05-29 – 2015-06-02 (×5): 20 mg via ORAL
  Filled 2015-05-29 (×5): qty 1

## 2015-05-29 MED ORDER — LABETALOL HCL 300 MG PO TABS
300.0000 mg | ORAL_TABLET | Freq: Two times a day (BID) | ORAL | Status: DC
  Administered 2015-05-29 – 2015-06-03 (×10): 300 mg via ORAL
  Filled 2015-05-29 (×10): qty 1

## 2015-05-29 MED ORDER — INSULIN ASPART 100 UNIT/ML ~~LOC~~ SOLN
0.0000 [IU] | Freq: Three times a day (TID) | SUBCUTANEOUS | Status: DC
  Administered 2015-05-29: 7 [IU] via SUBCUTANEOUS
  Administered 2015-05-30 (×2): 2 [IU] via SUBCUTANEOUS
  Administered 2015-05-31: 5 [IU] via SUBCUTANEOUS
  Administered 2015-05-31 (×2): 3 [IU] via SUBCUTANEOUS
  Administered 2015-06-01: 2 [IU] via SUBCUTANEOUS
  Administered 2015-06-01: 1 [IU] via SUBCUTANEOUS
  Administered 2015-06-02 (×2): 5 [IU] via SUBCUTANEOUS
  Administered 2015-06-02: 7 [IU] via SUBCUTANEOUS
  Administered 2015-06-03: 2 [IU] via SUBCUTANEOUS
  Administered 2015-06-03: 1 [IU] via SUBCUTANEOUS

## 2015-05-29 MED ORDER — ONDANSETRON HCL 4 MG PO TABS: 4.0000 mg | ORAL_TABLET | Freq: Four times a day (QID) | ORAL | Status: DC | PRN

## 2015-05-29 MED ORDER — MECLIZINE HCL 25 MG PO TABS: 25.0000 mg | ORAL_TABLET | Freq: Four times a day (QID) | ORAL | Status: DC | PRN

## 2015-05-29 MED ORDER — RENA-VITE PO TABS
1.0000 | ORAL_TABLET | Freq: Every day | ORAL | Status: DC
  Administered 2015-05-29 – 2015-06-02 (×5): 1 via ORAL
  Filled 2015-05-29 (×5): qty 1

## 2015-05-29 MED ORDER — IPRATROPIUM-ALBUTEROL 0.5-2.5 (3) MG/3ML IN SOLN: 3.0000 mL | RESPIRATORY_TRACT | Status: DC | PRN

## 2015-05-29 MED ORDER — DEXTROSE 5 % IV SOLN
2.0000 g | INTRAVENOUS | Status: DC
  Administered 2015-05-30 – 2015-06-02 (×2): 2 g via INTRAVENOUS
  Filled 2015-05-29 (×4): qty 2

## 2015-05-29 MED ORDER — ONDANSETRON HCL 4 MG/2ML IJ SOLN: 4.0000 mg | Freq: Four times a day (QID) | INTRAMUSCULAR | Status: DC | PRN

## 2015-05-29 MED ORDER — VANCOMYCIN HCL IN DEXTROSE 1-5 GM/200ML-% IV SOLN
1000.0000 mg | INTRAVENOUS | Status: DC
  Administered 2015-05-30 – 2015-06-01 (×2): 1000 mg via INTRAVENOUS
  Filled 2015-05-29 (×3): qty 200

## 2015-05-29 MED ORDER — SODIUM CHLORIDE 0.9 % IJ SOLN
3.0000 mL | Freq: Two times a day (BID) | INTRAMUSCULAR | Status: DC
  Administered 2015-05-29 – 2015-06-03 (×10): 3 mL via INTRAVENOUS

## 2015-05-29 NOTE — ED Notes (Signed)
Ordered patient a dinner tray.

## 2015-05-29 NOTE — ED Notes (Signed)
Attempted report, plan  For bedside

## 2015-05-29 NOTE — ED Notes (Addendum)
Pt here with cough since Friday that got worse over the weekend with SOB, reports feeling tightness in chest. Had vomiting over the weekend. Pt is a x 4 with family present. Is dialysis pt, last session on Monday. Is febrile 101.0

## 2015-05-29 NOTE — ED Notes (Signed)
Pt's weight 176.9 lb

## 2015-05-29 NOTE — Consult Note (Signed)
Reason for Consult: Continuity of ESRD care Referring Physician: Linna Darner M.D. [facility])  HPI:  74 year old African American woman with past medical history significant for end-stage renal disease from underlying hypertension and diabetes. She gets dialysis on a Monday/Wednesday/Friday schedule at the [facility] unit. Presents with 3-4 day history of worsening cough with sputum production, fevers, chills and progressively worsening shortness of breath that occurred today. She also had transient nausea and vomiting prior to presentation. By physical exam, history and radiological data found to have healthcare associated pneumonia. Her last hemodialysis treatment was on Monday and apparently was completed without problems although after dialysis, she went home and had nausea and vomiting.  Dialysis prescription: Patient unfortunately very unclear about details except for dialysis time that is 4 hours. She does not know what her dry weight is. Dialysis unit closed at the time of encounter.  Past Medical History  Diagnosis Date  . Cataract   . Chronic kidney disease   . Shortness of breath   . Anemia   . CVA (cerebral infarction)   . Dialysis patient ([place])   . Diabetes mellitus     Type 2  . Stroke [facility])     right side weakness  . GERD (gastroesophageal reflux disease)   . Renal disorder   . Diabetes mellitus without complication ([place])   . Hypertension   . Dialysis patient ([place])     M, W, F  . Fistula     R arm    Past Surgical History  Procedure Laterality Date  . Cataract extraction w/phaco  11/20/2011    Procedure: CATARACT EXTRACTION PHACO AND INTRAOCULAR LENS PLACEMENT (IOC);  Surgeon: Tonny Branch, MD;  Location: [facility];  Service: Ophthalmology;  Laterality: Right;  CDE 18.82  . Cataract extraction w/phaco Left 11/18/2012    Procedure: CATARACT EXTRACTION PHACO AND INTRAOCULAR LENS PLACEMENT (IOC);  Surgeon: Tonny Branch, MD;  Location: [facility];  Service: Ophthalmology;   Laterality: Left;  CDE: 18.97  . Colonoscopy N/A 02/09/2013    Procedure: COLONOSCOPY;  Surgeon: Rogene Houston, MD;  [facility];  Service: Endoscopy;  Laterality: N/A;  305-moved to 220 Ann to notify pt  . Insertion of dialysis catheter Right 06/24/2013    Procedure: INSERTION OF DIALYSIS CATHETER: Ultrasound guided;  Surgeon: Serafina Mitchell, MD;  Location: [place];  Service: Vascular;  Laterality: Right;  . 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 Tee without cardioversion N/A 07/21/2013    Procedure: TRANSESOPHAGEAL ECHOCARDIOGRAM (TEE);  Surgeon: Dorothy Spark, MD;  Location: [place];  Service: Cardiovascular;  Laterality: N/A;  . Av fistula placement Right 09/08/2013    Procedure: CREATION OF RIGHT BRACHIAL CEPHALIC ARTERIOVENOUS FISTULA ;  Surgeon: Mal Misty, MD;  Location: [place];  Service: Vascular;  Laterality: Right;  . Bascilic vein transposition Right 01/26/2014    Procedure: Right Arm BASILIC VEIN TRANSPOSITION;  Surgeon: Mal Misty, MD;  Location: [place];  Service: Vascular;  Laterality: Right;  . Ligation of arteriovenous  fistula Right 01/26/2014    Procedure: LIGATION OF ARTERIOVENOUS  FISTULA;  Surgeon: Mal Misty, MD;  Location: [place];  Service: Vascular;  Laterality: Right;  . Loop recorder implant N/A 07/21/2013    Procedure: LOOP RECORDER IMPLANT;  Surgeon: Evans Lance, MD;  Location: [facility] CATH LAB;  Service: Cardiovascular;  Laterality: N/A;    Family History  Problem Relation Age of Onset  .  Anesthesia problems Neg Hx   . Hypotension Neg Hx   . Malignant hyperthermia Neg Hx   . Pseudochol deficiency Neg Hx   . Cancer Sister   . Cancer Brother     Social History:  reports that she has never smoked. She does not have any smokeless tobacco history on file. She reports that she does not drink alcohol or use illicit drugs.  Allergies:  Allergies  Allergen Reactions  . Ambien [Zolpidem] Other  (See Comments)    Hallucinations   . Reglan [Metoclopramide] Other (See Comments)    Hallucinations   . Reglan [Metoclopramide]     "makes me crazy"    Medications:  Scheduled: . acetaminophen        BMP Latest Ref Rng 05/29/2015 11/19/2014 08/02/2014  Glucose 65 - 99 mg/dL 312(H) 252(H) 267(H)  BUN 6 - 20 mg/dL 40(H) 40(H) 31(H)  Creatinine 0.44 - 1.00 mg/dL 8.43(H) 7.57(H) 7.32(H)  Sodium 135 - 145 mmol/L 136 140 138  Potassium 3.5 - 5.1 mmol/L 5.7(H) 5.0 4.6  Chloride 101 - 111 mmol/L 98(L) 101 102  CO2 22 - 32 mmol/L 24 25 24   Calcium 8.9 - 10.3 mg/dL 8.5(L) 8.8(L) 8.6    CBC Latest Ref Rng 05/29/2015 11/19/2014 08/02/2014  WBC 4.0 - 10.5 K/uL 17.8(H) 14.0(H) 10.4  Hemoglobin 12.0 - 15.0 g/dL 12.0 12.1 11.0(L)  Hematocrit 36.0 - 46.0 % 37.7 37.7 34.1(L)  Platelets 150 - 400 K/uL 121(L) 147(L) 133(L)      Dg Chest 2 View  05/29/2015  CLINICAL DATA:  Cough for 4 days. Progressive shortness of breath. Tightness in the chest. Fever at 101.0 degrees. EXAM: CHEST - 2 VIEW COMPARISON:  Two-view chest x-ray 07/12/2014. FINDINGS: Cardiomegaly and mild pulmonary vascular congestion are noted. There are no significant effusions. No focal airspace disease is present. An implanted loop recorder is again noted. A stent is noted in the right upper extremity. The visualized soft tissues and bony thorax are otherwise unremarkable. IMPRESSION: 1. Cardiomegaly and mild pulmonary vascular congestion, potentially representing early congestive heart failure. 2. No focal airspace disease. Electronically Signed   By: San Morelle M.D.   On: 05/29/2015 14:55    Review of Systems  Constitutional: Positive for fever, chills and malaise/fatigue. Negative for weight loss and diaphoresis.  HENT: Positive for congestion. Negative for ear discharge, ear pain and sore throat.   Eyes: Negative.   Respiratory: Positive for cough, sputum production and shortness of breath. Negative for hemoptysis,  wheezing and stridor.   Cardiovascular: Negative.   Gastrointestinal: Positive for nausea and vomiting. Negative for abdominal pain and diarrhea.  Genitourinary: Negative.   Musculoskeletal: Negative.   Skin: Negative.   Neurological: Positive for dizziness. Negative for tingling, tremors and sensory change.  Endo/Heme/Allergies: Negative.    Blood pressure 174/73, pulse 85, temperature 101 F (38.3 C), temperature source Oral, resp. rate 26, weight 80.241 kg (176 lb 14.4 oz), SpO2 96 %. Physical Exam  Nursing note and vitals reviewed. Constitutional: She is oriented to person, place, and time. She appears well-developed and well-nourished. No distress.  HENT:  Head: Normocephalic and atraumatic.  Mouth/Throat: No oropharyngeal exudate.  Eyes: Conjunctivae and EOM are normal. Pupils are equal, round, and reactive to light.  Neck: Normal range of motion. Neck supple. No JVD present. No thyromegaly present.  Cardiovascular: Normal rate, regular rhythm and normal heart sounds.   No murmur heard. Respiratory: Effort normal. No respiratory distress. She has wheezes. She has no rales.  GI: Soft.  Bowel sounds are normal. She exhibits no distension. There is no tenderness. There is no rebound.  Musculoskeletal: Normal range of motion. She exhibits no edema.  Right brachiobasilic fistula-pulsatile  Neurological: She is alert and oriented to person, place, and time.  Skin: Skin is warm and dry. No rash noted. No erythema.  Psychiatric: She has a normal mood and affect.    Assessment/Plan: 1. Healthcare associated pneumonia: Patient currently not in distress or obviously hypoxic, being admitted for intravenous antibiotic therapy. Chest x-ray reviewed and physical exam corroborates that she is not in florid volume overload to prompt need for emergent hemodialysis. 2. End-stage renal disease: Resume hemodialysis tomorrow per her usual outpatient schedule-no acute indications noted today. Her mild  hyperkalemia appears to be associated with hyperglycemia. 3. Hypertension: Resume antihypertensive therapy-labetalol as well as close monitoring for further adjustments of therapy. Ultrafiltration with hemodialysis tomorrow. 4. Metabolic bone disease: Reconcile medications from outpatient dialysis unit list with regards to binders/vitamin D receptor analog. 5. Anemia of chronic kidney disease: Hemoglobin currently at acceptable levels, no indication for ESA at this time. 6. Nutrition: Check albumin with labs tomorrow at hemodialysis-continue renal vitamin.  Reuben Knoblock K. 05/29/2015, 7:04 PM

## 2015-05-29 NOTE — H&P (Signed)
[facility] History and Physical  [name] [id number] DOB: 08/14/1940 DOA: 05/29/2015  Referring physician: Emergency Department PCP: [name], MD   CHIEF COMPLAINT: Shortness of breath   HPI: [name] is a 74 y.o. female  with multiple medical problems not limited to hypertension, diabetes, history of stroke, and end-stage renal disease on hemodialysis M,W, F in [place].  Patient presents to the emergency department today with a 3 day history of cough productive of yellow sputum / shortness of breath. She is febrile with a temp of 101. Patient otherwise feels okay.   ED COURSE:     Labs:   WBC 17.8, platelets 121 Trop 0.09 Lactic acid 1.68 CXR:    Cardiomegaly, mild pulmonary vascular congestion possibly representing early CHF. No focal airspace disease  EKG:    Sinus rhythm Minimal ST depression, diffuse leads ok no STEMI Confirmed by Johnney Killian, MD, Jeannie Done [phone]) on 05/29/2015 3:11:25 PM  Medications  acetaminophen (TYLENOL) 325 MG tablet (not administered)  ceFEPIme (MAXIPIME) 1 g in dextrose 5 % 50 mL IVPB (1 g Intravenous New Bag/Given 05/29/15 1633)  ceFEPIme (MAXIPIME) 2 g in dextrose 5 % 50 mL IVPB (not administered)  vancomycin (VANCOCIN) 1,750 mg in sodium chloride 0.9 % 500 mL IVPB (not administered)  vancomycin (VANCOCIN) IVPB 1000 mg/200 mL premix (not administered)  acetaminophen (TYLENOL) tablet 650 mg (650 mg Oral Given 05/29/15 1423)  ipratropium-albuterol (DUONEB) 0.5-2.5 (3) MG/3ML nebulizer solution 3 mL (3 mLs Nebulization Given 05/29/15 1547)    Review of Systems  Constitutional: Positive for fever.  HENT: Negative.   Eyes: Negative.   Respiratory: Positive for cough, sputum production and wheezing.   Cardiovascular: Negative.   Gastrointestinal: Negative.   Genitourinary: Negative.   Musculoskeletal: Negative.   Skin: Negative.   Neurological: Negative.   Endo/Heme/Allergies: Negative.     Psychiatric/Behavioral: Negative.     Past Medical History  Diagnosis Date  . Cataract   . Chronic kidney disease   . Shortness of breath   . Anemia   . CVA (cerebral infarction)   . Dialysis patient ([place])   . Diabetes mellitus     Type 2  . Stroke [facility])     right side weakness  . GERD (gastroesophageal reflux disease)   . Renal disorder   . Diabetes mellitus without complication ([place])   . Hypertension   . Dialysis patient ([place])     M, W, F  . Fistula     R arm   Past Surgical History  Procedure Laterality Date  . Cataract extraction w/phaco  11/20/2011    Procedure: CATARACT EXTRACTION PHACO AND INTRAOCULAR LENS PLACEMENT (IOC);  Surgeon: Tonny Branch, MD;  Location: [facility];  Service: Ophthalmology;  Laterality: Right;  CDE 18.82  . Cataract extraction w/phaco Left 11/18/2012    Procedure: CATARACT EXTRACTION PHACO AND INTRAOCULAR LENS PLACEMENT (IOC);  Surgeon: Tonny Branch, MD;  Location: [facility];  Service: Ophthalmology;  Laterality: Left;  CDE: 18.97  . Colonoscopy N/A 02/09/2013    Procedure: COLONOSCOPY;  Surgeon: Rogene Houston, MD;  [facility];  Service: Endoscopy;  Laterality: N/A;  305-moved to 220 Ann to notify pt  . Insertion of dialysis catheter Right 06/24/2013    Procedure: INSERTION OF DIALYSIS CATHETER: Ultrasound guided;  Surgeon: Serafina Mitchell, MD;  Location: [place];  Service: Vascular;  Laterality: Right;  . 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  Tee without cardioversion N/A 07/21/2013    Procedure: TRANSESOPHAGEAL ECHOCARDIOGRAM (TEE);  Surgeon: Dorothy Spark, MD;  Location: [place];  Service: Cardiovascular;  Laterality: N/A;  . Av fistula placement Right 09/08/2013    Procedure: CREATION OF RIGHT BRACHIAL CEPHALIC ARTERIOVENOUS FISTULA ;  Surgeon: Mal Misty, MD;  Location: [place];  Service: Vascular;  Laterality: Right;  . Bascilic vein transposition Right 01/26/2014    Procedure: Right  Arm BASILIC VEIN TRANSPOSITION;  Surgeon: Mal Misty, MD;  Location: [place];  Service: Vascular;  Laterality: Right;  . Ligation of arteriovenous  fistula Right 01/26/2014    Procedure: LIGATION OF ARTERIOVENOUS  FISTULA;  Surgeon: Mal Misty, MD;  Location: [place];  Service: Vascular;  Laterality: Right;  . Loop recorder implant N/A 07/21/2013    Procedure: LOOP RECORDER IMPLANT;  Surgeon: Evans Lance, MD;  Location: [facility] CATH LAB;  Service: Cardiovascular;  Laterality: N/A;    SOCIAL HISTORY:  reports that she has never smoked. She does not have any smokeless tobacco history on file. She reports that she does not drink alcohol or use illicit drugs. Lives:             With:                       Assistive devices:   None needed for ambulation.   Allergies  Allergen Reactions  . Ambien [Zolpidem] Other (See Comments)    Hallucinations   . Reglan [Metoclopramide] Other (See Comments)    Hallucinations   . Reglan [Metoclopramide]     "makes me crazy"    Family History  Problem Relation Age of Onset  . Anesthesia problems Neg Hx   . Hypotension Neg Hx   . Malignant hyperthermia Neg Hx   . Pseudochol deficiency Neg Hx   . Cancer Sister   . Cancer Brother     Prior to Admission medications   Medication Sig Start Date End Date Taking? Authorizing Provider  albuterol (PROVENTIL HFA;VENTOLIN HFA) 108 (90 BASE) MCG/ACT inhaler Inhale 1-2 puffs into the lungs every 6 (six) hours as needed for wheezing. 08/02/14  Yes Tanna Furry, MD  atorvastatin (LIPITOR) 20 MG tablet Take 1 tablet by mouth daily. 11/09/14  Yes Historical Provider, MD  cyanocobalamin (,VITAMIN B-12,) 1000 MCG/ML injection Inject 1 mL into the muscle every 30 (thirty) days. 11/01/14  Yes Historical Provider, MD  ibuprofen (ADVIL,MOTRIN) 200 MG tablet Take 200-400 mg by mouth every 6 (six) hours as needed for moderate pain.    Yes Historical Provider, MD  insulin NPH Human (HUMULIN N,NOVOLIN N) 100 UNIT/ML injection Inject 10  Units into the skin 2 (two) times daily before a meal.   Yes Historical Provider, MD  labetalol (NORMODYNE) 300 MG tablet Take 300 mg by mouth 2 (two) times daily. 11/06/14  Yes Historical Provider, MD  meclizine (ANTIVERT) 25 MG tablet Take 1 tablet by mouth every 6 (six) hours as needed for dizziness.  11/07/14  Yes Historical Provider, MD  multivitamin (RENA-VIT) TABS tablet Take 1 tablet by mouth at bedtime. 08/21/14  Yes Charlett Blake, MD   PHYSICAL EXAMDanley Danker Vitals:   05/29/15 1545 05/29/15 1548 05/29/15 1600 05/29/15 1615  BP: 150/81 150/81 160/69 162/68  Pulse: 84 84 83 80  Temp:      TempSrc:      Resp: 36 34 37 29  Weight:   80.241 kg (176 lb 14.4 oz)   SpO2: 93%  94% 96% 97%    Wt Readings from Last 3 Encounters:  05/29/15 80.241 kg (176 lb 14.4 oz)  11/19/14 78.019 kg (172 lb)  08/02/14 73.483 kg (162 lb)    General:  Pleasant black female. Appears calm and comfortable Eyes: PER, normal lids, irises & conjunctiva ENT: grossly normal hearing, lips Neck: no LAD, no masses Cardiovascular: RRR, No LE edema.  Respiratory: Respirations even and unlabored. Normal respiratory effort. Bilateral lower lobe expiratory wheezing.  .   Abdomen: soft, non-distended, non-tender, active bowel sounds. No obvious masses.  Skin: no rash seen on limited exam Musculoskeletal: grossly normal tone BUE/BLE Psychiatric: grossly normal mood and affect, speech fluent and appropriate Neurologic: grossly non-focal.         LABS ON ADMISSION:    Basic Metabolic Panel:  Recent Labs Lab 05/29/15 1458  NA 136  K 5.7*  CL 98*  CO2 24  GLUCOSE 312*  BUN 40*  CREATININE 8.43*  CALCIUM 8.5*   CBC:  Recent Labs Lab 05/29/15 1458  WBC 17.8*  HGB 12.0  HCT 37.7  MCV 97.2  PLT 121*    Radiological Exams on Admission: Dg Chest 2 View  05/29/2015  CLINICAL DATA:  Cough for 4 days. Progressive shortness of breath. Tightness in the chest. Fever at 101.0 degrees. EXAM: CHEST - 2 VIEW  COMPARISON:  Two-view chest x-ray 07/12/2014. FINDINGS: Cardiomegaly and mild pulmonary vascular congestion are noted. There are no significant effusions. No focal airspace disease is present. An implanted loop recorder is again noted. A stent is noted in the right upper extremity. The visualized soft tissues and bony thorax are otherwise unremarkable. IMPRESSION: 1. Cardiomegaly and mild pulmonary vascular congestion, potentially representing early congestive heart failure. 2. No focal airspace disease. Electronically Signed   By: San Morelle M.D.   On: 05/29/2015 14:55    ASSESSMENT / PLAN   Productive cough, fever, leukocytosis. No PNA on CXR but still high suspicion for HCAP.  -admit to telemetry -IV antibiotics. Started on Vanco and Maxipime in ED. Subsequent doses scheduled to be given during dialysis tomorrow.  -Nebulizers prn wheezing -Mucinex BID   Diabetes mellitus, type 2.  -Hold oral home diabetic medications -Monitor CBGs, start sliding scale insulin  Hypertension, stable. -continue home oral hypertensive meds  End stage renal disease. Dialyzes Monday, Wednesday, and Friday in [place]. Dr. Posey Pronto with [facility] notified of patient's admission.  Hyperkalemia. K+ 5.7. Her baseline is in upper 4's to 5. Spoke with Dr. Posey Pronto, feels comfortable with K+ level. She dialyzes tomorrow.  -monitor on telemetry  CONSULTANTS:    Nephrology - Dr. Posey Pronto to see  Code Status:  Full code DVT Prophylaxis:  Heparin Family Communication   Patient alert, oriented and understands plan of care.   Disposition Plan: Discharge to home in 2-3 days   Time spent: 60 minutes Tye Savoy  NP [facility] Pager [phone]

## 2015-05-29 NOTE — ED Provider Notes (Signed)
CSN: [id number]     Arrival date & time 05/29/15  1308 History   First MD Initiated Contact with Patient 05/29/15 1447     Chief Complaint  Patient presents with  . Shortness of Breath  . Cough     (Consider location/radiation/quality/duration/timing/severity/associated sxs/prior Treatment) HPI Patient reports cough and shortness of breath for 3 days. She  reports thick yellow sputum production. Patient has a fever at this time but is not sure if he had a fever over the weekend. She states with deep cough she spirits of some chest pain. She has had increasing shortness of breath over the weekend. The patient is a dialysis patient. She reports she has not missed any dialysis. She had dialysis both Friday and Monday which was yesterday. He denies any increased swelling or fluid in her lower extremities. No calf pain. In conjunction with illness she has had vomiting. She reports a small amount of vomiting today. No diarrhea. Past Medical History  Diagnosis Date  . Cataract   . Chronic kidney disease   . Shortness of breath   . Anemia   . CVA (cerebral infarction)   . Dialysis patient ([place])   . Diabetes mellitus     Type 2  . Stroke [facility])     right side weakness  . GERD (gastroesophageal reflux disease)   . Renal disorder   . Diabetes mellitus without complication ([place])   . Hypertension   . Dialysis patient ([place])     M, W, F  . Fistula     R arm   Past Surgical History  Procedure Laterality Date  . Cataract extraction w/phaco  11/20/2011    Procedure: CATARACT EXTRACTION PHACO AND INTRAOCULAR LENS PLACEMENT (IOC);  Surgeon: Tonny Branch, MD;  Location: [facility] ORS;  Service: Ophthalmology;  Laterality: Right;  CDE 18.82  . Cataract extraction w/phaco Left 11/18/2012    Procedure: CATARACT EXTRACTION PHACO AND INTRAOCULAR LENS PLACEMENT (IOC);  Surgeon: Tonny Branch, MD;  Location: [facility] ORS;  Service: Ophthalmology;  Laterality: Left;  CDE: 18.97  . Colonoscopy N/A 02/09/2013    Procedure:  COLONOSCOPY;  Surgeon: Rogene Houston, MD;  Location: [facility] ENDO SUITE;  Service: Endoscopy;  Laterality: N/A;  305-moved to 220 Ann to notify pt  . Insertion of dialysis catheter Right 06/24/2013    Procedure: INSERTION OF DIALYSIS CATHETER: Ultrasound guided;  Surgeon: Serafina Mitchell, MD;  Location: [place];  Service: Vascular;  Laterality: Right;  . 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 Tee without cardioversion N/A 07/21/2013    Procedure: TRANSESOPHAGEAL ECHOCARDIOGRAM (TEE);  Surgeon: Dorothy Spark, MD;  Location: [place];  Service: Cardiovascular;  Laterality: N/A;  . Av fistula placement Right 09/08/2013    Procedure: CREATION OF RIGHT BRACHIAL CEPHALIC ARTERIOVENOUS FISTULA ;  Surgeon: Mal Misty, MD;  Location: [place];  Service: Vascular;  Laterality: Right;  . Bascilic vein transposition Right 01/26/2014    Procedure: Right Arm BASILIC VEIN TRANSPOSITION;  Surgeon: Mal Misty, MD;  Location: [place];  Service: Vascular;  Laterality: Right;  . Ligation of arteriovenous  fistula Right 01/26/2014    Procedure: LIGATION OF ARTERIOVENOUS  FISTULA;  Surgeon: Mal Misty, MD;  Location: [place];  Service: Vascular;  Laterality: Right;  . Loop recorder implant N/A 07/21/2013    Procedure: LOOP RECORDER IMPLANT;  Surgeon: Evans Lance, MD;  Location: [facility] CATH LAB;  Service: Cardiovascular;  Laterality: N/A;   Family History  Problem Relation Age of Onset  . Anesthesia problems Neg Hx   . Hypotension Neg Hx   . Malignant hyperthermia Neg Hx   . Pseudochol deficiency Neg Hx   . Cancer Sister   . Cancer Brother    Social History  Substance Use Topics  . Smoking status: Never Smoker   . Smokeless tobacco: None  . Alcohol Use: No   OB History    Gravida Para Term Preterm AB TAB SAB Ectopic Multiple Living   0 0 0 0 0 0 0 0       Review of Systems 10 Systems reviewed and are negative for acute change except as noted in the  HPI.    Allergies  Ambien; Reglan; and Reglan  Home Medications   Prior to Admission medications   Medication Sig Start Date End Date Taking? Authorizing Provider  albuterol (PROVENTIL HFA;VENTOLIN HFA) 108 (90 BASE) MCG/ACT inhaler Inhale 1-2 puffs into the lungs every 6 (six) hours as needed for wheezing. 08/02/14  Yes Tanna Furry, MD  atorvastatin (LIPITOR) 20 MG tablet Take 1 tablet by mouth daily. 11/09/14  Yes Historical Provider, MD  cyanocobalamin (,VITAMIN B-12,) 1000 MCG/ML injection Inject 1 mL into the muscle every 30 (thirty) days. 11/01/14  Yes Historical Provider, MD  ibuprofen (ADVIL,MOTRIN) 200 MG tablet Take 200-400 mg by mouth every 6 (six) hours as needed for moderate pain.    Yes Historical Provider, MD  insulin NPH Human (HUMULIN N,NOVOLIN N) 100 UNIT/ML injection Inject 10 Units into the skin 2 (two) times daily before a meal.   Yes Historical Provider, MD  labetalol (NORMODYNE) 300 MG tablet Take 300 mg by mouth 2 (two) times daily. 11/06/14  Yes Historical Provider, MD  meclizine (ANTIVERT) 25 MG tablet Take 1 tablet by mouth every 6 (six) hours as needed for dizziness.  11/07/14  Yes Historical Provider, MD  multivitamin (RENA-VIT) TABS tablet Take 1 tablet by mouth at bedtime. 08/21/14  Yes Charlett Blake, MD   BP 150/81 mmHg  Pulse 84  Temp(Src) 101 F (38.3 C) (Oral)  Resp 34  Wt 176 lb 14.4 oz (80.241 kg)  SpO2 94% Physical Exam  Constitutional: She is oriented to person, place, and time. She appears well-developed and well-nourished.  Patient is alert and nontoxic. Mild tachypnea without respiratory distress.  HENT:  Head: Normocephalic and atraumatic.  Mouth/Throat: Oropharynx is clear and moist.  Eyes: EOM are normal. Pupils are equal, round, and reactive to light.  Neck: Neck supple.  Cardiovascular: Normal rate, regular rhythm, normal heart sounds and intact distal pulses.   Pulmonary/Chest: Effort normal. She has wheezes. She has rales.  Patient  has cough paroxysmal with deep inspiration. Scattered Rales and wheezes throughout.  Abdominal: Soft. Bowel sounds are normal. She exhibits no distension. There is no tenderness.  Musculoskeletal: Normal range of motion. She exhibits no edema or tenderness.  Neurological: She is alert and oriented to person, place, and time. She has normal strength. Coordination normal. GCS eye subscore is 4. GCS verbal subscore is 5. GCS motor subscore is 6.  Skin: Skin is warm, dry and intact.  Psychiatric: She has a normal mood and affect.    ED Course  Procedures (including critical care time) Labs Review Labs Reviewed  CBC - Abnormal; Notable for the following:    WBC 17.8 (*)    Platelets 121 (*)    All other components within normal limits  I-STAT TROPOININ, ED -  Abnormal; Notable for the following:    Troponin i, poc 0.09 (*)    All other components within normal limits  BASIC METABOLIC PANEL  I-STAT CG4 LACTIC ACID, ED    Imaging Review Dg Chest 2 View  05/29/2015  CLINICAL DATA:  Cough for 4 days. Progressive shortness of breath. Tightness in the chest. Fever at 101.0 degrees. EXAM: CHEST - 2 VIEW COMPARISON:  Two-view chest x-ray 07/12/2014. FINDINGS: Cardiomegaly and mild pulmonary vascular congestion are noted. There are no significant effusions. No focal airspace disease is present. An implanted loop recorder is again noted. A stent is noted in the right upper extremity. The visualized soft tissues and bony thorax are otherwise unremarkable. IMPRESSION: 1. Cardiomegaly and mild pulmonary vascular congestion, potentially representing early congestive heart failure. 2. No focal airspace disease. Electronically Signed   By: San Morelle M.D.   On: 05/29/2015 14:55   I have personally reviewed and evaluated these images and lab results as part of my medical decision-making.   EKG Interpretation   Date/Time:  Tuesday May 29 2015 15:04:39 EST Ventricular Rate:  85 PR Interval:   140 QRS Duration: 75 QT Interval:  388 QTC Calculation: 461 R Axis:   67 Text Interpretation:  Sinus rhythm Minimal ST depression, diffuse leads ok  no STEMI Confirmed by Johnney Killian, MD, Jeannie Done [phone]) on 05/29/2015 3:11:25 PM      MDM   Final diagnoses:  Healthcare-associated pneumonia  ESRD (end stage renal disease) on dialysis [facility])   Patient presents with productive cough and increasing shortness of breath. She is febrile in the emergency department. Patient has chest pain only with deep coughing paroxysm. Patient is on scheduled for dialysis, no missed treatments. She is due for dialysis again tomorrow. Currently her mental status is alert and appropriate. She has some tachypnea without significant respiratory distress. Treatment will be initiated for healthcare associated pneumonia and patient be admitted for ongoing monitoring and treatment.    Charlesetta Shanks, MD 05/29/15 [phone]

## 2015-05-29 NOTE — ED Notes (Signed)
Attempted report x1. 

## 2015-05-29 NOTE — Progress Notes (Signed)
ANTIBIOTIC CONSULT NOTE - INITIAL  Pharmacy Consult for vancomycin and cefepime Indication: rule out pneumonia  Allergies  Allergen Reactions  . Ambien [Zolpidem] Other (See Comments)    Hallucinations   . Reglan [Metoclopramide] Other (See Comments)    Hallucinations   . Reglan [Metoclopramide]     "makes me crazy"    Patient Measurements:   Wt = 80.2 kg  Vital Signs: Temp: 101 F (38.3 C) (12/13 1414) Temp Source: Oral (12/13 1414) BP: 177/76 mmHg (12/13 1509) Pulse Rate: 85 (12/13 1509) Intake/Output from previous day:   Intake/Output from this shift:    Labs:  Recent Labs  05/29/15 1458  WBC 17.8*  HGB 12.0  PLT 121*   CrCl cannot be calculated (Unknown ideal weight.). No results for input(s): VANCOTROUGH, VANCOPEAK, VANCORANDOM, GENTTROUGH, GENTPEAK, GENTRANDOM, TOBRATROUGH, TOBRAPEAK, TOBRARND, AMIKACINPEAK, AMIKACINTROU, AMIKACIN in the last 72 hours.   Microbiology: No results found for this or any previous visit (from the past 720 hour(s)).  Medical History: Past Medical History  Diagnosis Date  . Cataract   . Chronic kidney disease   . Shortness of breath   . Anemia   . CVA (cerebral infarction)   . Dialysis patient ([place])   . Diabetes mellitus     Type 2  . Stroke [facility])     right side weakness  . GERD (gastroesophageal reflux disease)   . Renal disorder   . Diabetes mellitus without complication ([place])   . Hypertension   . Dialysis patient ([place])     M, W, F  . Fistula     R arm    Medications:   (Not in a hospital admission) Assessment: 74 yo F w/ ESRD on MWF-HD presenting w/ cough and SOB x3 days. She also reports thick yellow sputum production. She has not missed any HD sessions and did not receive antibiotics yesterday in HD. CT chest shows cardiomegaly and mild pulmonary vascular congestion, potentially representing early CHF, no focal airspace disease. Pharmacy consulted to dose vancomycin and cefepime for r/o pneumonia.    Febrile to 101, WBC 17.8, LA 1.68, BP 177/76  Goal of Therapy:  Vancomycin trough level 15-20 mcg/ml  Plan:  - Vancomycin 1750 mg x1, then 1000 mg MWF w/ HD - Cefepime 1 g x 1, then cefepime 2 g IV MWF after HD - Duration will be 8 days per consult comment - Measure antibiotic drug levels at steady state - F/U culture results, HD schedule/tolerance, clinical improvement, weight changes (pt is just barely above cutoff for 750 mg dosing)  Governor Specking, PharmD Clinical Pharmacy Resident Pager: [phone] 05/29/2015,3:37 PM

## 2015-05-30 DIAGNOSIS — J189 Pneumonia, unspecified organism: Principal | ICD-10-CM

## 2015-05-30 DIAGNOSIS — E1122 Type 2 diabetes mellitus with diabetic chronic kidney disease: Secondary | ICD-10-CM

## 2015-05-30 DIAGNOSIS — I10 Essential (primary) hypertension: Secondary | ICD-10-CM

## 2015-05-30 DIAGNOSIS — Z992 Dependence on renal dialysis: Secondary | ICD-10-CM

## 2015-05-30 DIAGNOSIS — N186 End stage renal disease: Secondary | ICD-10-CM

## 2015-05-30 DIAGNOSIS — E875 Hyperkalemia: Secondary | ICD-10-CM

## 2015-05-30 DIAGNOSIS — Z794 Long term (current) use of insulin: Secondary | ICD-10-CM

## 2015-05-30 LAB — CBC WITH DIFFERENTIAL/PLATELET
BASOS PCT: 0 %
Basophils Absolute: 0 10*3/uL (ref 0.0–0.1)
EOS ABS: 0 10*3/uL (ref 0.0–0.7)
EOS PCT: 0 %
HEMATOCRIT: 33.7 % — AB (ref 36.0–46.0)
Hemoglobin: 11.2 g/dL — ABNORMAL LOW (ref 12.0–15.0)
Lymphocytes Relative: 5 %
Lymphs Abs: 1 10*3/uL (ref 0.7–4.0)
MCH: 31.5 pg (ref 26.0–34.0)
MCHC: 33.2 g/dL (ref 30.0–36.0)
MCV: 94.9 fL (ref 78.0–100.0)
MONO ABS: 1.9 10*3/uL — AB (ref 0.1–1.0)
MONOS PCT: 10 %
NEUTROS ABS: 16.4 10*3/uL — AB (ref 1.7–7.7)
Neutrophils Relative %: 85 %
Platelets: 127 10*3/uL — ABNORMAL LOW (ref 150–400)
RBC: 3.55 MIL/uL — ABNORMAL LOW (ref 3.87–5.11)
RDW: 12.9 % (ref 11.5–15.5)
WBC: 19.4 10*3/uL — ABNORMAL HIGH (ref 4.0–10.5)

## 2015-05-30 LAB — RENAL FUNCTION PANEL
ANION GAP: 12 (ref 5–15)
Albumin: 3 g/dL — ABNORMAL LOW (ref 3.5–5.0)
BUN: 56 mg/dL — AB (ref 6–20)
CALCIUM: 8.2 mg/dL — AB (ref 8.9–10.3)
CHLORIDE: 100 mmol/L — AB (ref 101–111)
CO2: 23 mmol/L (ref 22–32)
CREATININE: 10.02 mg/dL — AB (ref 0.44–1.00)
GFR calc Af Amer: 4 mL/min — ABNORMAL LOW (ref >60)
GFR calc non Af Amer: 3 mL/min — ABNORMAL LOW (ref >60)
GLUCOSE: 228 mg/dL — AB (ref 65–99)
Phosphorus: 5.2 mg/dL — ABNORMAL HIGH (ref 2.5–4.6)
Potassium: 5 mmol/L (ref 3.5–5.1)
SODIUM: 135 mmol/L (ref 135–145)

## 2015-05-30 LAB — GLUCOSE, CAPILLARY
GLUCOSE-CAPILLARY: 184 mg/dL — AB (ref 65–99)
Glucose-Capillary: 159 mg/dL — ABNORMAL HIGH (ref 65–99)
Glucose-Capillary: 182 mg/dL — ABNORMAL HIGH (ref 65–99)

## 2015-05-30 LAB — HIV ANTIBODY (ROUTINE TESTING W REFLEX): HIV Screen 4th Generation wRfx: NONREACTIVE

## 2015-05-30 MED ORDER — GUAIFENESIN ER 600 MG PO TB12
1200.0000 mg | ORAL_TABLET | Freq: Two times a day (BID) | ORAL | Status: DC
  Administered 2015-05-30 – 2015-06-03 (×8): 1200 mg via ORAL
  Filled 2015-05-30 (×8): qty 2

## 2015-05-30 MED ORDER — ALTEPLASE 2 MG IJ SOLR: 2.0000 mg | Freq: Once | INTRAMUSCULAR | Status: DC | PRN

## 2015-05-30 MED ORDER — SODIUM CHLORIDE 0.9 % IV SOLN: 100.0000 mL | INTRAVENOUS | Status: DC | PRN

## 2015-05-30 MED ORDER — IPRATROPIUM-ALBUTEROL 0.5-2.5 (3) MG/3ML IN SOLN: 3.0000 mL | RESPIRATORY_TRACT | Status: DC | PRN

## 2015-05-30 MED ORDER — LIDOCAINE-PRILOCAINE 2.5-2.5 % EX CREA: 1.0000 "application " | TOPICAL_CREAM | CUTANEOUS | Status: DC | PRN

## 2015-05-30 MED ORDER — LIDOCAINE HCL (PF) 1 % IJ SOLN: 5.0000 mL | INTRAMUSCULAR | Status: DC | PRN

## 2015-05-30 MED ORDER — HEPARIN SODIUM (PORCINE) 1000 UNIT/ML DIALYSIS: 40.0000 [IU]/kg | INTRAMUSCULAR | Status: DC | PRN

## 2015-05-30 MED ORDER — IPRATROPIUM-ALBUTEROL 0.5-2.5 (3) MG/3ML IN SOLN
3.0000 mL | Freq: Four times a day (QID) | RESPIRATORY_TRACT | Status: DC
  Administered 2015-05-30 – 2015-06-03 (×13): 3 mL via RESPIRATORY_TRACT
  Filled 2015-05-30 (×15): qty 3

## 2015-05-30 MED ORDER — PENTAFLUOROPROP-TETRAFLUOROETH EX AERO: 1.0000 "application " | INHALATION_SPRAY | CUTANEOUS | Status: DC | PRN

## 2015-05-30 MED ORDER — INSULIN NPH (HUMAN) (ISOPHANE) 100 UNIT/ML ~~LOC~~ SUSP
5.0000 [IU] | Freq: Two times a day (BID) | SUBCUTANEOUS | Status: DC
  Administered 2015-05-30: 5 [IU] via SUBCUTANEOUS
  Filled 2015-05-30: qty 10

## 2015-05-30 MED ORDER — HEPARIN SODIUM (PORCINE) 1000 UNIT/ML DIALYSIS: 1000.0000 [IU] | INTRAMUSCULAR | Status: DC | PRN

## 2015-05-30 NOTE — Progress Notes (Signed)
[facility] PROGRESS NOTE  [name] [id number] DOB: 03/21/1941 DOA: 05/29/2015 PCP: [name], MD  Assessment/Plan: #1 probable healthcare associated pneumonia Patient presenting clinically with symptoms consistent with a pneumonia with productive cough of yellowish sputum, worsening shortness of breath, febrile with a temperature 101 and a leukocytosis of 17.8. Patient with clinical improvement. Blood cultures pending. Sputum Gram stain and culture pending. Urine Legionella antigen pending. Urine pneumococcus antigen pending. Patient with a temp of 100.5 last night however fever curve trending down. Continue empiric IV vancomycin and IV cefepime. Continue Duonebs. Place on Mucinex. Follow.  #2 diabetes mellitus type 2 Check a hemoglobin A1c. CBG ranging from 184 - 309. Continue sliding scale insulin. Place on half home dose NPH. Follow.  #3 hypertension Continue labetalol.  #4 end-stage renal disease on hemodialysis Patient in hemodialysis today. Per renal.  #5 hyperkalemia Patient in hemodialysis today. Follow.  #6 prophylaxis Heparin for DVT prophylaxis.   Code Status: Full Family Communication: Updated patient. No family present. Disposition Plan: Home when medically stable.   Consultants:  Nephrology: Dr. Posey Pronto 05/29/2015  Procedures:  Chest x-ray 05/29/2015  Antibiotics:  IV cefepime 05/29/2015  IV vancomycin 05/29/2015  HPI/Subjective: Patient in hemodialysis. Patient states cough improved. Patient states shortness of breath improved. Feeling much better.  Objective: Filed Vitals:   05/30/15 1428 05/30/15 1433  BP: 182/80 181/76  Pulse: 98 90  Temp:  98.9 F (37.2 C)  Resp:  20    Intake/Output Summary (Last 24 hours) at 05/30/15 1602 Last data filed at 05/30/15 1433  Gross per 24 hour  Intake    500 ml  Output   2900 ml  Net  -2400 ml   Filed Weights   05/29/15 2055 05/30/15 1026 05/30/15 1433  Weight: 81.602 kg (179 lb  14.4 oz) 81.7 kg (180 lb 1.9 oz) 78.9 kg (173 lb 15.1 oz)    Exam:   General:  NAD  Cardiovascular: RRR  Respiratory: CTAB anterior lung fields.  Abdomen: Soft, nontender, nondistended, positive bowel sounds.  Musculoskeletal: No clubbing cyanosis or edema.  Data Reviewed: Basic Metabolic Panel:  Recent Labs Lab 05/29/15 1458 05/30/15 1030  NA 136 135  K 5.7* 5.0  CL 98* 100*  CO2 24 23  GLUCOSE 312* 228*  BUN 40* 56*  CREATININE 8.43* 10.02*  CALCIUM 8.5* 8.2*  PHOS  --  5.2*   Liver Function Tests:  Recent Labs Lab 05/30/15 1030  ALBUMIN 3.0*   No results for input(s): LIPASE, AMYLASE in the last 168 hours. No results for input(s): AMMONIA in the last 168 hours. CBC:  Recent Labs Lab 05/29/15 1458 05/30/15 1030  WBC 17.8* 19.4*  NEUTROABS  --  16.4*  HGB 12.0 11.2*  HCT 37.7 33.7*  MCV 97.2 94.9  PLT 121* 127*   Cardiac Enzymes: No results for input(s): CKTOTAL, CKMB, CKMBINDEX, TROPONINI in the last 168 hours. BNP (last 3 results) No results for input(s): BNP in the last 8760 hours.  ProBNP (last 3 results) No results for input(s): PROBNP in the last 8760 hours.  CBG:  Recent Labs Lab 05/29/15 2108 05/30/15 0732  GLUCAP 309* 184*    Recent Results (from the past 240 hour(s))  Culture, blood (routine x 2) Call MD if unable to obtain prior to antibiotics being given     Status: None (Preliminary result)   Collection Time: 05/29/15  9:39 PM  Result Value Ref Range Status   Specimen Description BLOOD LEFT ARM  Final   Special  Requests IN PEDIATRIC BOTTLE 4CC  Final   Culture NO GROWTH < 12 HOURS  Final   Report Status PENDING  Incomplete  Culture, blood (routine x 2) Call MD if unable to obtain prior to antibiotics being given     Status: None (Preliminary result)   Collection Time: 05/29/15  9:52 PM  Result Value Ref Range Status   Specimen Description BLOOD LEFT HAND  Final   Special Requests IN PEDIATRIC BOTTLE 2CC  Final    Culture NO GROWTH < 12 HOURS  Final   Report Status PENDING  Incomplete     Studies: Dg Chest 2 View  05/29/2015  CLINICAL DATA:  Cough for 4 days. Progressive shortness of breath. Tightness in the chest. Fever at 101.0 degrees. EXAM: CHEST - 2 VIEW COMPARISON:  Two-view chest x-ray 07/12/2014. FINDINGS: Cardiomegaly and mild pulmonary vascular congestion are noted. There are no significant effusions. No focal airspace disease is present. An implanted loop recorder is again noted. A stent is noted in the right upper extremity. The visualized soft tissues and bony thorax are otherwise unremarkable. IMPRESSION: 1. Cardiomegaly and mild pulmonary vascular congestion, potentially representing early congestive heart failure. 2. No focal airspace disease. Electronically Signed   By: San Morelle M.D.   On: 05/29/2015 14:55    Scheduled Meds: . atorvastatin  20 mg Oral q1800  . ceFEPime (MAXIPIME) IV  2 g Intravenous Q M,W,F-HD  . heparin  5,000 Units Subcutaneous 3 times per day  . insulin aspart  0-9 Units Subcutaneous TID WC  . ipratropium-albuterol  3 mL Nebulization Q6H  . labetalol  300 mg Oral BID  . multivitamin  1 tablet Oral QHS  . sodium chloride  3 mL Intravenous Q12H  . vancomycin  1,000 mg Intravenous Q M,W,F-HD   Continuous Infusions:   Principal Problem:   HCAP (healthcare-associated pneumonia) Active Problems:   Diabetes mellitus, type 2 (HCC)   Leukocytosis   Hypertension   End stage renal disease (HCC)   Hyperkalemia    Time spent: 54 minutes    [name] M.D. [facility] Pager [phone]. If 7PM-7AM, please contact night-coverage at [email or web address], password [facility] 05/30/2015, 4:02 PM  LOS: 1 day

## 2015-05-30 NOTE — Procedures (Signed)
Patient was seen on dialysis and the procedure was supervised. BFR 400 Via RUE AVF BP is 172/85.  Patient appears to be tolerating treatment well.   Her outpatient prescription is 4 hour duration, EDW 82.5kg, 3k/2.5Ca.  She is already 1kg below her edw and we will challenge her another 2-3L as BP tolerates.

## 2015-05-31 DIAGNOSIS — I120 Hypertensive chronic kidney disease with stage 5 chronic kidney disease or end stage renal disease: Secondary | ICD-10-CM | POA: Insufficient documentation

## 2015-05-31 DIAGNOSIS — D72829 Elevated white blood cell count, unspecified: Secondary | ICD-10-CM

## 2015-05-31 DIAGNOSIS — E119 Type 2 diabetes mellitus without complications: Secondary | ICD-10-CM | POA: Insufficient documentation

## 2015-05-31 DIAGNOSIS — N186 End stage renal disease: Secondary | ICD-10-CM | POA: Insufficient documentation

## 2015-05-31 LAB — CUP PACEART REMOTE DEVICE CHECK: MDC IDC SESS DTM: 20161205023714

## 2015-05-31 LAB — GLUCOSE, CAPILLARY
GLUCOSE-CAPILLARY: 231 mg/dL — AB (ref 65–99)
GLUCOSE-CAPILLARY: 141 mg/dL — AB (ref 65–99)
Glucose-Capillary: 225 mg/dL — ABNORMAL HIGH (ref 65–99)
Glucose-Capillary: 299 mg/dL — ABNORMAL HIGH (ref 65–99)

## 2015-05-31 LAB — RENAL FUNCTION PANEL
Albumin: 2.8 g/dL — ABNORMAL LOW (ref 3.5–5.0)
Anion gap: 15 (ref 5–15)
BUN: 37 mg/dL — AB (ref 6–20)
CALCIUM: 8.1 mg/dL — AB (ref 8.9–10.3)
CO2: 25 mmol/L (ref 22–32)
CREATININE: 7.13 mg/dL — AB (ref 0.44–1.00)
Chloride: 97 mmol/L — ABNORMAL LOW (ref 101–111)
GFR calc Af Amer: 6 mL/min — ABNORMAL LOW (ref >60)
GFR calc non Af Amer: 5 mL/min — ABNORMAL LOW (ref >60)
GLUCOSE: 195 mg/dL — AB (ref 65–99)
Phosphorus: 4.2 mg/dL (ref 2.5–4.6)
Potassium: 5.4 mmol/L — ABNORMAL HIGH (ref 3.5–5.1)
Sodium: 137 mmol/L (ref 135–145)

## 2015-05-31 LAB — CBC
HEMATOCRIT: 35.1 % — AB (ref 36.0–46.0)
Hemoglobin: 11.3 g/dL — ABNORMAL LOW (ref 12.0–15.0)
MCH: 30.8 pg (ref 26.0–34.0)
MCHC: 32.2 g/dL (ref 30.0–36.0)
MCV: 95.6 fL (ref 78.0–100.0)
PLATELETS: 149 10*3/uL — AB (ref 150–400)
RBC: 3.67 MIL/uL — ABNORMAL LOW (ref 3.87–5.11)
RDW: 13 % (ref 11.5–15.5)
WBC: 17.4 10*3/uL — AB (ref 4.0–10.5)

## 2015-05-31 MED ORDER — INSULIN NPH (HUMAN) (ISOPHANE) 100 UNIT/ML ~~LOC~~ SUSP
10.0000 [IU] | Freq: Two times a day (BID) | SUBCUTANEOUS | Status: DC
  Administered 2015-05-31 – 2015-06-02 (×5): 10 [IU] via SUBCUTANEOUS
  Filled 2015-05-31: qty 10

## 2015-05-31 MED ORDER — FLUTICASONE PROPIONATE 50 MCG/ACT NA SUSP
2.0000 | Freq: Every day | NASAL | Status: DC
  Administered 2015-05-31 – 2015-06-03 (×4): 2 via NASAL
  Filled 2015-05-31: qty 16

## 2015-05-31 MED ORDER — LORATADINE 10 MG PO TABS
10.0000 mg | ORAL_TABLET | Freq: Every day | ORAL | Status: DC
  Administered 2015-05-31 – 2015-06-03 (×4): 10 mg via ORAL
  Filled 2015-05-31 (×4): qty 1

## 2015-05-31 NOTE — Progress Notes (Signed)
Offered to give pt bath, daughter insist that she was going to give bath. Daughter did so with Cheryl's assistance.

## 2015-05-31 NOTE — Evaluation (Signed)
Physical Therapy Evaluation Patient Details Name: [name] MRN: [id number] DOB: Dec 21, 1940 Today's Date: 05/31/2015   History of Present Illness  pt is a 74 yo female admitted for SOB and cough.  Pt with pneumonia.  Pt with h/o CKD on HD, CVA with R side weakness, DM.  Clinical Impression  Pt admitted with the above diagnosis. Pt currently with functional limitations due to the deficits listed below (see PT Problem List). 3/4 dyspnea on exertion with SpO2 92% on room air while ambulating with PT. Rolling walker provides improved stability when compared to gait without a device. States her sons provide 24/7 assist as needed at home. She will have 4 steps to navigate to enter her home. Pt will benefit from skilled PT to increase their independence and safety with mobility to allow discharge to the venue listed below.       Follow Up Recommendations Home health PT;Supervision for mobility/OOB    Equipment Recommendations   (Pending progress, possibly a rolling walker)    Recommendations for Other Services       Precautions / Restrictions Precautions Precautions: Fall Restrictions Weight Bearing Restrictions: No      Mobility  Bed Mobility Overal bed mobility: Needs Assistance Bed Mobility: Supine to Sit     Supine to sit: Supervision;HOB elevated     General bed mobility comments: Requires extra time, supervision for safety but no physical assist.  Transfers Overall transfer level: Needs assistance Equipment used: Rolling walker (2 wheeled) Transfers: Sit to/from Stand Sit to Stand: Supervision         General transfer comment: Supervision for safety. Slow to rise. VC for hand placement. Good use of RW for support once upright. Mild sway noted.  Ambulation/Gait Ambulation/Gait assistance: Min guard Ambulation Distance (Feet): 100 Feet Assistive device: Rolling walker (2 wheeled);None Gait Pattern/deviations: Step-through pattern;Decreased stride  length;Drifts right/left;Trunk flexed;Wide base of support Gait velocity: decreased Gait velocity interpretation: Below normal speed for age/gender General Gait Details: Educated on safe DME use with a rolling walker. VC for upright posture. Majority of bout with RW for support, short distance without, demonstrating more sway when RW taken away. Steady with UE support on RW. No overt loss of balance or buckling noted when using this device. Required 1 standing rest break to complete distance. 3/4 dyspnea, cues for pursed lip breathing throughout. SpO2 92% on room air.  Stairs            Wheelchair Mobility    Modified Rankin (Stroke Patients Only)       Balance Overall balance assessment: Needs assistance Sitting-balance support: No upper extremity supported;Feet supported Sitting balance-Leahy Scale: Good     Standing balance support: No upper extremity supported Standing balance-Leahy Scale: Fair Standing balance comment: Pt fatigues quickly therefore needs outside assist to remain standing safely.                             Pertinent Vitals/Pain Pain Assessment: No/denies pain    Home Living Family/patient expects to be discharged to:: Private residence Living Arrangements: Children Available Help at Discharge: Family;Available 24 hours/day Type of Home: House Home Access: Stairs to enter Entrance Stairs-Rails: None Entrance Stairs-Number of Steps: 4 Home Layout: One level Home Equipment: Cane - single point      Prior Function Level of Independence: Needs assistance   Gait / Transfers Assistance Needed: walks [place] with walker  ADL's / Homemaking Assistance Needed: daughter assists pt to bottom  of tub to bathe and with cooking/cleaning.        Hand Dominance   Dominant Hand: Right    Extremity/Trunk Assessment   Upper Extremity Assessment: Defer to OT evaluation           Lower Extremity Assessment: Generalized weakness       Cervical / Trunk Assessment: Normal  Communication   Communication: No difficulties  Cognition Arousal/Alertness: Awake/alert Behavior During Therapy: WFL for tasks assessed/performed Overall Cognitive Status: History of cognitive impairments - at baseline                      General Comments General comments (skin integrity, edema, etc.): 3/4 dyspnea with exertion. SpO2 93% at rest on room air, 92% ambulating on room air.    Exercises General Exercises - Lower Extremity Ankle Circles/Pumps: AROM;Both;10 reps;Seated      Assessment/Plan    PT Assessment Patient needs continued PT services  PT Diagnosis Difficulty walking;Abnormality of gait;Generalized weakness   PT Problem List Decreased strength;Decreased activity tolerance;Decreased balance;Decreased mobility;Decreased knowledge of use of DME;Cardiopulmonary status limiting activity  PT Treatment Interventions DME instruction;Gait training;Stair training;Functional mobility training;Therapeutic activities;Therapeutic exercise;Balance training;Neuromuscular re-education;Patient/family education   PT Goals (Current goals can be found in the Care Plan section) Acute Rehab PT Goals Patient Stated Goal: to get stronger PT Goal Formulation: With patient Time For Goal Achievement: 06/14/15 Potential to Achieve Goals: Good    Frequency Min 3X/week   Barriers to discharge        Co-evaluation               End of Session   Activity Tolerance: Patient tolerated treatment well Patient left: in chair;with call bell/phone within reach Nurse Communication: Mobility status         Time: [id number] PT Time Calculation (min) (ACUTE ONLY): 14 min   Charges:   PT Evaluation $Initial PT Evaluation Tier I: 1 Procedure     PT G CodesEllouise Newer 05/31/2015, 3:50 PM  Camille Bal Peletier, [place]

## 2015-05-31 NOTE — Evaluation (Signed)
Occupational Therapy Evaluation Patient Details Name: [name] MRN: [id number] DOB: 11/02/1940 Today's Date: 05/31/2015    History of Present Illness pt is a 74 yo female admitted for SOB and cough.  Pt with pneumonia.  Pt with h/o CKD on HD, CVA with R side weakness, DM.   Clinical Impression   Pt admitted with above diagnosis and has the deficits listed below. Pt would benefit from cont OT to increase independence with basic adls and learn energy conservation techniques so she will be more independent and successful at home with her adls and decrease the burden of care on her caregiver.      Follow Up Recommendations  Home health OT;Supervision/Assistance - 24 hour    Equipment Recommendations  3 in 1 bedside comode    Recommendations for Other Services       Precautions / Restrictions Precautions Precautions: Fall Restrictions Weight Bearing Restrictions: No      Mobility Bed Mobility Overal bed mobility: Needs Assistance Bed Mobility: Supine to Sit     Supine to sit: Min assist;HOB elevated     General bed mobility comments: HOB at 30 degrees  Transfers Overall transfer level: Needs assistance Equipment used: Rolling walker (2 wheeled) Transfers: Sit to/from Stand Sit to Stand: Min assist         General transfer comment: Pt required very little assist to power up.    Balance Overall balance assessment: Needs assistance Sitting-balance support: Feet supported Sitting balance-Leahy Scale: Good     Standing balance support: Bilateral upper extremity supported;During functional activity Standing balance-Leahy Scale: Fair Standing balance comment: Pt fatigues quickly therefore needs outside assist to remain standing safely.                            ADL Overall ADL's : Needs assistance/impaired Eating/Feeding: Set up;Sitting Eating/Feeding Details (indicate cue type and reason): pt very fatigue during eval.  Pt could cut own  food but to conserve energy, therapist cut food.  Daughter often cuts food at home. Grooming: Wash/dry hands;Wash/dry face;Set up;Sitting   Upper Body Bathing: Set up;Sitting   Lower Body Bathing: Minimal assistance;Sit to/from stand Lower Body Bathing Details (indicate cue type and reason): pt fatigues quickly requiring assist at tiems. Upper Body Dressing : Set up;Sitting   Lower Body Dressing: Minimal assistance;Sit to/from stand Lower Body Dressing Details (indicate cue type and reason): only needs occasional assist standing due to fatigue. Toilet Transfer: Minimal assistance;BSC;Stand-pivot;RW   Toileting- Water quality scientist and Hygiene: Moderate assistance;Sit to/from stand Toileting - Clothing Manipulation Details (indicate cue type and reason): Pt very fatigued in standing, even had difficulty holding neck up after 10 minutes of treatment.  Pt held to walker while therapist cleaned pt.     Functional mobility during ADLs: Minimal assistance;Rolling walker General ADL Comments: Pt does well with adls she just fatigues very quickly.  Pt can reach feet by crossing them up.  Pt needs min assist in standing due to fatigue.     Vision Vision Assessment?: No apparent visual deficits   Perception     Praxis      Pertinent Vitals/Pain Pain Assessment: No/denies pain     Hand Dominance Right   Extremity/Trunk Assessment Upper Extremity Assessment Upper Extremity Assessment: Generalized weakness   Lower Extremity Assessment Lower Extremity Assessment: Defer to PT evaluation   Cervical / Trunk Assessment Cervical / Trunk Assessment: Normal   Communication Communication Communication: No difficulties   Cognition Arousal/Alertness:  Awake/alert Behavior During Therapy: WFL for tasks assessed/performed Overall Cognitive Status: History of cognitive impairments - at baseline                     General Comments       Exercises       Shoulder Instructions       Home Living Family/patient expects to be discharged to:: Private residence Living Arrangements: Children Available Help at Discharge: Family;Available 24 hours/day Type of Home: House Home Access: Stairs to enter [employer] of Steps: 4 Entrance Stairs-Rails: None Home Layout: One level     Bathroom Shower/Tub: Tub/shower unit;Curtain Shower/tub characteristics: Architectural technologist: Standard     Home Equipment: Environmental consultant - 2 wheels          Prior Functioning/Environment Level of Independence: Needs assistance  Gait / Transfers Assistance Needed: walks Ily with walker ADL's / Homemaking Assistance Needed: daughter assists pt to bottom of tub to bathe and with cooking/cleaning.        OT Diagnosis: Generalized weakness   OT Problem List: Decreased strength;Decreased activity tolerance;Impaired balance (sitting and/or standing);Decreased cognition;Decreased knowledge of use of DME or AE   OT Treatment/Interventions: Self-care/ADL training;Energy conservation;Therapeutic activities    OT Goals(Current goals can be found in the care plan section) Acute Rehab OT Goals Patient Stated Goal: to get stronger OT Goal Formulation: With patient Time For Goal Achievement: 06/14/15 Potential to Achieve Goals: Good ADL Goals Pt Will Perform Grooming: with supervision;standing Pt Will Perform Tub/Shower Transfer: Tub transfer;shower seat;ambulating;rolling walker;with min guard assist Additional ADL Goal #1: Pt will walk to bathroom with walker and toilet on 3:1 over commode with S. Additional ADL Goal #2: Pt will state three things she can do at home to conserve energy for later in the day.  OT Frequency: Min 2X/week   Barriers to D/C:    great family support       Co-evaluation              End of Session Equipment Utilized During Treatment: Surveyor, mining Communication: Mobility status  Activity Tolerance: Patient limited by fatigue Patient  left: in bed;with call bell/phone within reach;with family/visitor present   Time: 1245-1306 OT Time Calculation (min): 21 min Charges:  OT General Charges $OT Visit: 1 Procedure OT Evaluation $Initial OT Evaluation Tier I: 1 Procedure G-Codes:    Glenford Peers 06-20-2015, 1:15 PM  [phone]

## 2015-05-31 NOTE — Progress Notes (Signed)
Carelink summary report received. Battery status OK. Normal device function. No new symptom episodes, tachy episodes, brady, or pause episodes. No new AF episodes. Monthly summary reports and PRN follow up

## 2015-05-31 NOTE — Progress Notes (Signed)
Patient ID: [name], female   DOB: 01/22/1941, 74 y.o.   MRN: [id number]  [facility] Progress Note    Subjective:   Feels better today   Objective:   BP 125/57 mmHg  Pulse 85  Temp(Src) 98.8 F (37.1 C) (Oral)  Resp 20  Wt 78.9 kg (173 lb 15.1 oz)  SpO2 98%  Intake/Output: I/O last 3 completed shifts: In: 940 [P.O.:240; I.V.:500; IV Piggyback:200] Out: 2900 [Other:2900]   Intake/Output this shift:  Total I/O In: 240 [P.O.:240] Out: -  Weight change: 1.459 kg (3 lb 3.5 oz)  Physical Exam: Gen:WD WN AAF in NAD CVS:no rub Resp:scattered rhonchi and expiratory wheezes [id number] Ext:RUE AVF +T/B, no edema  Labs: BMET  Recent Labs Lab 05/29/15 1458 05/30/15 1030 05/31/15 0655  NA 136 135 137  K 5.7* 5.0 5.4*  CL 98* 100* 97*  CO2 24 23 25   GLUCOSE 312* 228* 195*  BUN 40* 56* 37*  CREATININE 8.43* 10.02* 7.13*  ALBUMIN  --  3.0* 2.8*  CALCIUM 8.5* 8.2* 8.1*  PHOS  --  5.2* 4.2   CBC  Recent Labs Lab 05/29/15 1458 05/30/15 1030 05/31/15 0655  WBC 17.8* 19.4* 17.4*  NEUTROABS  --  16.4*  --   HGB 12.0 11.2* 11.3*  HCT 37.7 33.7* 35.1*  MCV 97.2 94.9 95.6  PLT 121* 127* 149*    @IMGRELPRIORS @ Medications:    . atorvastatin  20 mg Oral q1800  . ceFEPime (MAXIPIME) IV  2 g Intravenous Q M,W,F-HD  . guaiFENesin  1,200 mg Oral BID  . heparin  5,000 Units Subcutaneous 3 times per day  . insulin aspart  0-9 Units Subcutaneous TID WC  . insulin NPH Human  10 Units Subcutaneous BID AC & HS  . ipratropium-albuterol  3 mL Nebulization Q6H  . labetalol  300 mg Oral BID  . multivitamin  1 tablet Oral QHS  . sodium chloride  3 mL Intravenous Q12H  . vancomycin  1,000 mg Intravenous Q M,W,F-HD   HD orders:  [facility] [place], MWF; EDW 82.5kg (post weight was 79kg yesterday); time 4hours; 3K/2.25Ca; BFR400/DFR600;  Hectoral 3.37mcg tiw; Epo 2600 units tiw; no heparin  Assessment/ Plan:    Assessment: 1. HCAP 2. ESRD 3. HTN 4. CKD-MBD 5. Anemia of chronic disease  Assessment/Plan: 1. Continue with antibiotics 2. HD qMWF and challenging edw (came in below and UF 2L yesterday), continue to challenge 3. Hyperkalemia today- will follow and plan for HD tomorrow.  Donetta Potts, MD [place] Pager [phone] 05/31/2015, 11:08 AM

## 2015-05-31 NOTE — Progress Notes (Signed)
[facility] PROGRESS NOTE  AFTEN LASHOMB [id number] DOB: 22-Mar-1941 DOA: 05/29/2015 PCP: [name], MD  Assessment/Plan: #1 probable healthcare associated pneumonia Patient presenting clinically with symptoms consistent with a pneumonia with productive cough of yellowish sputum, worsening shortness of breath, febrile with a temperature 101 and a leukocytosis of 17.8. Patient with clinical improvement. Blood cultures pending. Sputum Gram stain and culture pending. Urine Legionella antigen pending. Urine pneumococcus antigen pending. Patient with a temp of 100.1 last night, however fever curve trending down. Continue empiric IV vancomycin and IV cefepime. Continue Duonebs, Mucinex. Add Flonase and Claritin. Follow.  #2 diabetes mellitus type 2 Check a hemoglobin A1c. CBG ranging from 182 - 299. Continue sliding scale insulin. Increase NPH to 15 units twice daily. Follow.  #3 hypertension Continue labetalol.  #4 end-stage renal disease on hemodialysis Patient for hemodialysis tommorrrow. Per renal.  #5 hyperkalemia Patient for hemodialysis tommorrow. Follow.  #6 prophylaxis Heparin for DVT prophylaxis.   Code Status: Full Family Communication: Updated patient and family at bedside. Disposition Plan: Home when medically stable.   Consultants:  Nephrology: Dr. Posey Pronto 05/29/2015  Procedures:  Chest x-ray 05/29/2015  Antibiotics:  IV cefepime 05/29/2015  IV vancomycin 05/29/2015  HPI/Subjective: Patient sitting up in chair. Patient with somewhat productive cough. Some improvement with shortness of breath.  Objective: Filed Vitals:   05/31/15 0446 05/31/15 1000  BP: 131/59 125/57  Pulse: 85 85  Temp: 99.5 F (37.5 C) 98.8 F (37.1 C)  Resp: 20 20    Intake/Output Summary (Last 24 hours) at 05/31/15 1532 Last data filed at 05/31/15 1300  Gross per 24 hour  Intake    720 ml  Output      0 ml  Net    720 ml   Filed Weights   05/29/15 2055  05/30/15 1026 05/30/15 1433  Weight: 81.602 kg (179 lb 14.4 oz) 81.7 kg (180 lb 1.9 oz) 78.9 kg (173 lb 15.1 oz)    Exam:   General:  NAD  Cardiovascular: RRR  Respiratory: Coarse scattered breath sounds. Some rhonchi noted.  Abdomen: Soft, nontender, nondistended, positive bowel sounds.  Musculoskeletal: No clubbing cyanosis or edema.  Data Reviewed: Basic Metabolic Panel:  Recent Labs Lab 05/29/15 1458 05/30/15 1030 05/31/15 0655  NA 136 135 137  K 5.7* 5.0 5.4*  CL 98* 100* 97*  CO2 24 23 25   GLUCOSE 312* 228* 195*  BUN 40* 56* 37*  CREATININE 8.43* 10.02* 7.13*  CALCIUM 8.5* 8.2* 8.1*  PHOS  --  5.2* 4.2   Liver Function Tests:  Recent Labs Lab 05/30/15 1030 05/31/15 0655  ALBUMIN 3.0* 2.8*   No results for input(s): LIPASE, AMYLASE in the last 168 hours. No results for input(s): AMMONIA in the last 168 hours. CBC:  Recent Labs Lab 05/29/15 1458 05/30/15 1030 05/31/15 0655  WBC 17.8* 19.4* 17.4*  NEUTROABS  --  16.4*  --   HGB 12.0 11.2* 11.3*  HCT 37.7 33.7* 35.1*  MCV 97.2 94.9 95.6  PLT 121* 127* 149*   Cardiac Enzymes: No results for input(s): CKTOTAL, CKMB, CKMBINDEX, TROPONINI in the last 168 hours. BNP (last 3 results) No results for input(s): BNP in the last 8760 hours.  ProBNP (last 3 results) No results for input(s): PROBNP in the last 8760 hours.  CBG:  Recent Labs Lab 05/30/15 0732 05/30/15 1648 05/30/15 1944 05/31/15 0759 05/31/15 1144  GLUCAP 184* 159* 182* 225* 299*    Recent Results (from the past 240 hour(s))  Culture, blood (  routine x 2) Call MD if unable to obtain prior to antibiotics being given     Status: None (Preliminary result)   Collection Time: 05/29/15  9:39 PM  Result Value Ref Range Status   Specimen Description BLOOD LEFT ARM  Final   Special Requests IN PEDIATRIC BOTTLE 4CC  Final   Culture NO GROWTH 2 DAYS  Final   Report Status PENDING  Incomplete  Culture, blood (routine x 2) Call MD if  unable to obtain prior to antibiotics being given     Status: None (Preliminary result)   Collection Time: 05/29/15  9:52 PM  Result Value Ref Range Status   Specimen Description BLOOD LEFT HAND  Final   Special Requests IN PEDIATRIC BOTTLE 2CC  Final   Culture NO GROWTH 2 DAYS  Final   Report Status PENDING  Incomplete     Studies: No results found.  Scheduled Meds: . atorvastatin  20 mg Oral q1800  . ceFEPime (MAXIPIME) IV  2 g Intravenous Q M,W,F-HD  . guaiFENesin  1,200 mg Oral BID  . heparin  5,000 Units Subcutaneous 3 times per day  . insulin aspart  0-9 Units Subcutaneous TID WC  . insulin NPH Human  10 Units Subcutaneous BID AC & HS  . ipratropium-albuterol  3 mL Nebulization Q6H  . labetalol  300 mg Oral BID  . multivitamin  1 tablet Oral QHS  . sodium chloride  3 mL Intravenous Q12H  . vancomycin  1,000 mg Intravenous Q M,W,F-HD   Continuous Infusions:   Principal Problem:   HCAP (healthcare-associated pneumonia) Active Problems:   Diabetes mellitus, type 2 (HCC)   Leukocytosis   Hypertension   End stage renal disease (HCC)   Hyperkalemia    Time spent: 13 minutes    Damesha Lawler M.D. [facility] Pager [phone]. If 7PM-7AM, please contact night-coverage at [email or web address], password [facility] 05/31/2015, 3:32 PM  LOS: 2 days

## 2015-06-01 LAB — RENAL FUNCTION PANEL
ANION GAP: 14 (ref 5–15)
Albumin: 2.5 g/dL — ABNORMAL LOW (ref 3.5–5.0)
BUN: 51 mg/dL — ABNORMAL HIGH (ref 6–20)
CALCIUM: 7.7 mg/dL — AB (ref 8.9–10.3)
CO2: 25 mmol/L (ref 22–32)
CREATININE: 9.8 mg/dL — AB (ref 0.44–1.00)
Chloride: 96 mmol/L — ABNORMAL LOW (ref 101–111)
GFR, EST AFRICAN AMERICAN: 4 mL/min — AB (ref >60)
GFR, EST NON AFRICAN AMERICAN: 3 mL/min — AB (ref >60)
Glucose, Bld: 133 mg/dL — ABNORMAL HIGH (ref 65–99)
PHOSPHORUS: 4.2 mg/dL (ref 2.5–4.6)
Potassium: 3.8 mmol/L (ref 3.5–5.1)
SODIUM: 135 mmol/L (ref 135–145)

## 2015-06-01 LAB — CBC
HCT: 32.7 % — ABNORMAL LOW (ref 36.0–46.0)
HEMOGLOBIN: 11 g/dL — AB (ref 12.0–15.0)
MCH: 31.4 pg (ref 26.0–34.0)
MCHC: 33.6 g/dL (ref 30.0–36.0)
MCV: 93.4 fL (ref 78.0–100.0)
PLATELETS: 143 10*3/uL — AB (ref 150–400)
RBC: 3.5 MIL/uL — AB (ref 3.87–5.11)
RDW: 13 % (ref 11.5–15.5)
WBC: 15.2 10*3/uL — AB (ref 4.0–10.5)

## 2015-06-01 LAB — GLUCOSE, CAPILLARY
GLUCOSE-CAPILLARY: 132 mg/dL — AB (ref 65–99)
GLUCOSE-CAPILLARY: 155 mg/dL — AB (ref 65–99)
GLUCOSE-CAPILLARY: 202 mg/dL — AB (ref 65–99)

## 2015-06-01 MED ORDER — PREDNISONE 50 MG PO TABS
60.0000 mg | ORAL_TABLET | Freq: Every day | ORAL | Status: DC
  Administered 2015-06-02 – 2015-06-03 (×2): 60 mg via ORAL
  Filled 2015-06-01 (×2): qty 1

## 2015-06-01 NOTE — Care Management Note (Signed)
Case Management Note  Patient Details  Name: CHIRSTINA METTEN MRN: [id number] Date of Birth: 01/26/1941  Subjective/Objective:    CM following for progression and d/c planning.                Action/Plan: 06/01/2015 Noted HH orders the only [facility] service available at home address is [facility]. [place] notified for [facility] orders and pt informed. Will continue to follow for any d/c needs.   Expected Discharge Date:                  Expected Discharge Plan:  [place]  In-House Referral:  NA  Discharge planning Services  CM Consult  Post Acute Care Choice:  Durable Medical Equipment, Home Health Choice offered to:  Patient  DME Arranged:  Gilford Rile, 3-N-1 DME Agency:  [place] Arranged:  PT, OT, Nurse's Aide [place] Agency:  [place]  Status of Service:  Completed, signed off  Medicare Important Message Given:    Date Medicare IM Given:    Medicare IM give by:    Date Additional Medicare IM Given:    Additional Medicare Important Message give by:     If discussed at [place] of Stay Meetings, dates discussed:    Additional Comments:  Adron Bene, RN 06/01/2015, 12:44 PM

## 2015-06-01 NOTE — Progress Notes (Addendum)
[facility] PROGRESS NOTE  [name] [id number] DOB: 03/25/1941 DOA: 05/29/2015 PCP: [name], MD  Assessment/Plan: #1 probable healthcare associated pneumonia Patient presenting clinically with symptoms consistent with a pneumonia with productive cough of yellowish sputum, worsening shortness of breath, febrile with a temperature 101 and a leukocytosis of 17.8. Patient with clinical improvement. Blood cultures pending. Sputum Gram stain and culture pending. Urine Legionella antigen pending. Urine pneumococcus antigen pending. Patient with a max temp of 99.4 last night, however fever curve trending down.  Discontinue IV vancomycin. Continue empiric IV cefepime. Continue Duonebs, Mucinex, Flonase and Claritin. Follow.  #2 diabetes mellitus type 2 Check a hemoglobin A1c. CBG ranging from 132 - 155. Continue sliding scale insulin. Continue NPH 10 units twice daily. Follow.  #3 hypertension Continue labetalol.  #4 end-stage renal disease on hemodialysis Per renal.  #5 hyperkalemia Patient for hemodialysis today. Follow.  #6 prophylaxis Heparin for DVT prophylaxis.   Code Status: Full Family Communication: Updated patient and family at bedside. Disposition Plan: Home when medically stable.   Consultants:  Nephrology: Dr. Posey Pronto 05/29/2015  Procedures:  Chest x-ray 05/29/2015  Antibiotics:  IV cefepime 05/29/2015  IV vancomycin 05/29/2015>>>>> 06/01/2015  HPI/Subjective: Patient with somewhat productive cough. Some improvement with shortness of breath.  Objective: Filed Vitals:   06/01/15 1030 06/01/15 1056  BP: 122/55 98/68  Pulse: 70 73  Temp:  98.3 F (36.8 C)  Resp:  20    Intake/Output Summary (Last 24 hours) at 06/01/15 1750 Last data filed at 06/01/15 1056  Gross per 24 hour  Intake      0 ml  Output   1400 ml  Net  -1400 ml   Filed Weights   05/31/15 2139 06/01/15 0640 06/01/15 1056  Weight: 77.565 kg (171 lb) 78.2 kg (172 lb  6.4 oz) 76.6 kg (168 lb 14 oz)    Exam:   General:  NAD  Cardiovascular: RRR  Respiratory: Coarse scattered breath sounds. Some rhonchi noted. Minimal wheezing.  Abdomen: Soft, nontender, nondistended, positive bowel sounds.  Musculoskeletal: No clubbing cyanosis or edema.  Data Reviewed: Basic Metabolic Panel:  Recent Labs Lab 05/29/15 1458 05/30/15 1030 05/31/15 0655 06/01/15 0700  NA 136 135 137 135  K 5.7* 5.0 5.4* 3.8  CL 98* 100* 97* 96*  CO2 24 23 25 25   GLUCOSE 312* 228* 195* 133*  BUN 40* 56* 37* 51*  CREATININE 8.43* 10.02* 7.13* 9.80*  CALCIUM 8.5* 8.2* 8.1* 7.7*  PHOS  --  5.2* 4.2 4.2   Liver Function Tests:  Recent Labs Lab 05/30/15 1030 05/31/15 0655 06/01/15 0700  ALBUMIN 3.0* 2.8* 2.5*   No results for input(s): LIPASE, AMYLASE in the last 168 hours. No results for input(s): AMMONIA in the last 168 hours. CBC:  Recent Labs Lab 05/29/15 1458 05/30/15 1030 05/31/15 0655 06/01/15 0700  WBC 17.8* 19.4* 17.4* 15.2*  NEUTROABS  --  16.4*  --   --   HGB 12.0 11.2* 11.3* 11.0*  HCT 37.7 33.7* 35.1* 32.7*  MCV 97.2 94.9 95.6 93.4  PLT 121* 127* 149* 143*   Cardiac Enzymes: No results for input(s): CKTOTAL, CKMB, CKMBINDEX, TROPONINI in the last 168 hours. BNP (last 3 results) No results for input(s): BNP in the last 8760 hours.  ProBNP (last 3 results) No results for input(s): PROBNP in the last 8760 hours.  CBG:  Recent Labs Lab 05/31/15 1144 05/31/15 1636 05/31/15 2119 06/01/15 1154 06/01/15 1712  GLUCAP 299* 231* 141* 132* 155*  Recent Results (from the past 240 hour(s))  Culture, blood (routine x 2) Call MD if unable to obtain prior to antibiotics being given     Status: None (Preliminary result)   Collection Time: 05/29/15  9:39 PM  Result Value Ref Range Status   Specimen Description BLOOD LEFT ARM  Final   Special Requests IN PEDIATRIC BOTTLE 4CC  Final   Culture NO GROWTH 3 DAYS  Final   Report Status PENDING   Incomplete  Culture, blood (routine x 2) Call MD if unable to obtain prior to antibiotics being given     Status: None (Preliminary result)   Collection Time: 05/29/15  9:52 PM  Result Value Ref Range Status   Specimen Description BLOOD LEFT HAND  Final   Special Requests IN PEDIATRIC BOTTLE 2CC  Final   Culture NO GROWTH 3 DAYS  Final   Report Status PENDING  Incomplete     Studies: No results found.  Scheduled Meds: . atorvastatin  20 mg Oral q1800  . ceFEPime (MAXIPIME) IV  2 g Intravenous Q M,W,F-HD  . fluticasone  2 spray Each Nare Daily  . guaiFENesin  1,200 mg Oral BID  . heparin  5,000 Units Subcutaneous 3 times per day  . insulin aspart  0-9 Units Subcutaneous TID WC  . insulin NPH Human  10 Units Subcutaneous BID AC & HS  . ipratropium-albuterol  3 mL Nebulization Q6H  . labetalol  300 mg Oral BID  . loratadine  10 mg Oral Daily  . multivitamin  1 tablet Oral QHS  . sodium chloride  3 mL Intravenous Q12H   Continuous Infusions:   Principal Problem:   HCAP (healthcare-associated pneumonia) Active Problems:   Diabetes mellitus, type 2 (HCC)   Leukocytosis   Hypertension   End stage renal disease (HCC)   Hyperkalemia   DM type 2, goal A1c below 7    Time spent: 28 minutes    THOMPSON,DANIEL M.D. [facility] Pager [phone]. If 7PM-7AM, please contact night-coverage at [email or web address], password [facility] 06/01/2015, 5:50 PM  LOS: 3 days

## 2015-06-01 NOTE — Progress Notes (Signed)
Inpatient Diabetes Program Recommendations  AACE/ADA: New Consensus Statement on Inpatient Glycemic Control (2015)  Target Ranges:  Prepandial:   less than 140 mg/dL      Peak postprandial:   less than 180 mg/dL (1-2 hours)      Critically ill patients:  140 - 180 mg/dL  Results for [name], [name] (MRN [id number]) as of 06/01/2015 10:50  Ref. Range 06/01/2015 07:00  Glucose Latest Ref Range: 65-99 mg/dL 133 (H)   Results for [name], [name] (MRN [id number]) as of 06/01/2015 10:50  Ref. Range 05/31/2015 07:59 05/31/2015 11:44 05/31/2015 16:36 05/31/2015 21:19  Glucose-Capillary Latest Ref Range: 65-99 mg/dL 225 (H) 299 (H) 231 (H) 141 (H)   Review of Glycemic Control  Current orders for Inpatient glycemic control: NPH 10 units BID, Novolog 0-9 units TID with meals  Inpatient Diabetes Program Recommendations: Insulin - Basal: Please consider increasing morning NPH to 12 units QAM and leave evening NPH as ordered. Correction (SSI): Please consider ordering Novolog bedtime correction scale. Insulin - Meal Coverage: Please consider ordering Novolog 3 units TID with meals for meal coverage.  Thanks, Barnie Alderman, RN, MSN, CDE Diabetes Coordinator Inpatient Diabetes Program [phone] (Team Pager from [place] to [place]) [phone] ([place] office) [phone] [facility] office) [phone] [facility] office)

## 2015-06-01 NOTE — Progress Notes (Signed)
Pharmacy Antibiotic Follow-up Note  [name] is a 74 y.o. year-old female admitted on 05/29/2015.  The patient is currently on day 4 of Cefepime and Vancomcyin for probable HCAP.  Assessment/Plan: After discussion with Dr. Grandville Silos, the current antibiotics vancomycin and cefepime will be narrowed to cefepime for one more day, then likely switch to PO antibiotics tomorrow. Continue cefepime 2g IV qHD MWF for now and follow up switch to PO.  Temp (24hrs), Avg:98.6 F (37 C), Min:98 F (36.7 C), Max:99.4 F (37.4 C)   Recent Labs Lab 05/29/15 1458 05/30/15 1030 05/31/15 0655 06/01/15 0700  WBC 17.8* 19.4* 17.4* 15.2*    Recent Labs Lab 05/29/15 1458 05/30/15 1030 05/31/15 0655 06/01/15 0700  CREATININE 8.43* 10.02* 7.13* 9.80*   CrCl cannot be calculated (Unknown ideal weight.).    Allergies  Allergen Reactions  . Ambien [Zolpidem] Other (See Comments)    Hallucinations   . Reglan [Metoclopramide] Other (See Comments)    Hallucinations   . Reglan [Metoclopramide]     "makes me crazy"    Antimicrobials this admission: 12/13 cefepime >> 12/13 vanc >> 12/16  Levels/dose changes this admission: n/a  Microbiology results: 12/13 BCx2 >> ngtd   Thank you for allowing pharmacy to be a part of this patient's care.  Deboraha Sprang PharmD 06/01/2015 9:50 AM

## 2015-06-01 NOTE — Procedures (Signed)
Patient was seen on dialysis and the procedure was supervised. BFR 400 Via RUE AVF BP is 115/61.  Patient appears to be tolerating treatment well.  She is 4kg below edw here pre-dialysis.  Will need standing weight before discharge so we can adjust her outpatient EDW.

## 2015-06-02 LAB — GLUCOSE, CAPILLARY
Glucose-Capillary: 254 mg/dL — ABNORMAL HIGH (ref 65–99)
Glucose-Capillary: 273 mg/dL — ABNORMAL HIGH (ref 65–99)
Glucose-Capillary: 346 mg/dL — ABNORMAL HIGH (ref 65–99)
Glucose-Capillary: 253 mg/dL — ABNORMAL HIGH (ref 65–99)

## 2015-06-02 LAB — HEMOGLOBIN A1C
HEMOGLOBIN A1C: 9.8 % — AB (ref 4.8–5.6)
MEAN PLASMA GLUCOSE: 235 mg/dL

## 2015-06-02 LAB — RENAL FUNCTION PANEL
Albumin: 2.7 g/dL — ABNORMAL LOW (ref 3.5–5.0)
Anion gap: 15 (ref 5–15)
BUN: 30 mg/dL — ABNORMAL HIGH (ref 6–20)
CHLORIDE: 94 mmol/L — AB (ref 101–111)
CO2: 25 mmol/L (ref 22–32)
CREATININE: 6.44 mg/dL — AB (ref 0.44–1.00)
Calcium: 8 mg/dL — ABNORMAL LOW (ref 8.9–10.3)
GFR calc non Af Amer: 6 mL/min — ABNORMAL LOW (ref >60)
GFR, EST AFRICAN AMERICAN: 7 mL/min — AB (ref >60)
Glucose, Bld: 238 mg/dL — ABNORMAL HIGH (ref 65–99)
POTASSIUM: 5 mmol/L (ref 3.5–5.1)
Phosphorus: 3.7 mg/dL (ref 2.5–4.6)
Sodium: 134 mmol/L — ABNORMAL LOW (ref 135–145)

## 2015-06-02 LAB — CBC
HEMATOCRIT: 36.5 % (ref 36.0–46.0)
HEMOGLOBIN: 11.8 g/dL — AB (ref 12.0–15.0)
MCH: 30.3 pg (ref 26.0–34.0)
MCHC: 32.3 g/dL (ref 30.0–36.0)
MCV: 93.8 fL (ref 78.0–100.0)
PLATELETS: 165 10*3/uL (ref 150–400)
RBC: 3.89 MIL/uL (ref 3.87–5.11)
RDW: 12.8 % (ref 11.5–15.5)
WBC: 12.7 10*3/uL — ABNORMAL HIGH (ref 4.0–10.5)

## 2015-06-02 MED ORDER — INSULIN NPH (HUMAN) (ISOPHANE) 100 UNIT/ML ~~LOC~~ SUSP
15.0000 [IU] | Freq: Two times a day (BID) | SUBCUTANEOUS | Status: DC
  Administered 2015-06-02 – 2015-06-03 (×2): 15 [IU] via SUBCUTANEOUS

## 2015-06-02 MED ORDER — AMOXICILLIN-POT CLAVULANATE 875-125 MG PO TABS
1.0000 | ORAL_TABLET | Freq: Two times a day (BID) | ORAL | Status: DC
  Administered 2015-06-02: 1 via ORAL
  Filled 2015-06-02: qty 1

## 2015-06-02 MED ORDER — AMOXICILLIN-POT CLAVULANATE 500-125 MG PO TABS: 1.0000 | ORAL_TABLET | ORAL | Status: DC

## 2015-06-02 NOTE — Progress Notes (Signed)
Worked with pt on flutter. Encouraged to use every 2 hours

## 2015-06-02 NOTE — Progress Notes (Signed)
Patient ID: [name], female   DOB: 05/17/1941, 74 y.o.   MRN: [id number] S:feels well O:BP 150/55 mmHg  Pulse 73  Temp(Src) 98 F (36.7 C) (Oral)  Resp 18  Wt 77 kg (169 lb 12.1 oz)  SpO2 96%  Intake/Output Summary (Last 24 hours) at 06/02/15 0936 Last data filed at 06/01/15 1056  Gross per 24 hour  Intake      0 ml  Output   1400 ml  Net  -1400 ml   Intake/Output: I/O last 3 completed shifts: In: -  Out: 1400 [Other:1400]  Intake/Output this shift:    Weight change: -0.965 kg (-2 lb 2 oz) Gen:WD AAF in NAD CVS:no rub Resp:cta [id number] Ext:no edema RUE AVF +T/B   Recent Labs Lab 05/29/15 1458 05/30/15 1030 05/31/15 0655 06/01/15 0700 06/02/15 0549  NA 136 135 137 135 134*  K 5.7* 5.0 5.4* 3.8 5.0  CL 98* 100* 97* 96* 94*  CO2 24 23 25 25 25   GLUCOSE 312* 228* 195* 133* 238*  BUN 40* 56* 37* 51* 30*  CREATININE 8.43* 10.02* 7.13* 9.80* 6.44*  ALBUMIN  --  3.0* 2.8* 2.5* 2.7*  CALCIUM 8.5* 8.2* 8.1* 7.7* 8.0*  PHOS  --  5.2* 4.2 4.2 3.7   Liver Function Tests:  Recent Labs Lab 05/31/15 0655 06/01/15 0700 06/02/15 0549  ALBUMIN 2.8* 2.5* 2.7*   No results for input(s): LIPASE, AMYLASE in the last 168 hours. No results for input(s): AMMONIA in the last 168 hours. CBC:  Recent Labs Lab 05/29/15 1458 05/30/15 1030 05/31/15 0655 06/01/15 0700 06/02/15 0549  WBC 17.8* 19.4* 17.4* 15.2* 12.7*  NEUTROABS  --  16.4*  --   --   --   HGB 12.0 11.2* 11.3* 11.0* 11.8*  HCT 37.7 33.7* 35.1* 32.7* 36.5  MCV 97.2 94.9 95.6 93.4 93.8  PLT 121* 127* 149* 143* 165   Cardiac Enzymes: No results for input(s): CKTOTAL, CKMB, CKMBINDEX, TROPONINI in the last 168 hours. CBG:  Recent Labs Lab 05/31/15 2119 06/01/15 1154 06/01/15 1712 06/01/15 2112 06/02/15 0756  GLUCAP 141* 132* 155* 202* 254*    Iron Studies: No results for input(s): IRON, TIBC, TRANSFERRIN, FERRITIN in the last 72 hours. Studies/Results: No results found. Marland Kitchen  amoxicillin-clavulanate  1 tablet Oral Q12H  . atorvastatin  20 mg Oral q1800  . fluticasone  2 spray Each Nare Daily  . guaiFENesin  1,200 mg Oral BID  . heparin  5,000 Units Subcutaneous 3 times per day  . insulin aspart  0-9 Units Subcutaneous TID WC  . insulin NPH Human  10 Units Subcutaneous BID AC & HS  . ipratropium-albuterol  3 mL Nebulization Q6H  . labetalol  300 mg Oral BID  . loratadine  10 mg Oral Daily  . multivitamin  1 tablet Oral QHS  . predniSONE  60 mg Oral QAC breakfast  . sodium chloride  3 mL Intravenous Q12H    BMET    Component Value Date/Time   NA 134* 06/02/2015 0549   K 5.0 06/02/2015 0549   CL 94* 06/02/2015 0549   CO2 25 06/02/2015 0549   GLUCOSE 238* 06/02/2015 0549   BUN 30* 06/02/2015 0549   CREATININE 6.44* 06/02/2015 0549   CALCIUM 8.0* 06/02/2015 0549   GFRNONAA 6* 06/02/2015 0549   GFRAA 7* 06/02/2015 0549   CBC    Component Value Date/Time   WBC 12.7* 06/02/2015 0549   RBC 3.89 06/02/2015 0549   HGB 11.8* 06/02/2015 0549  HCT 36.5 06/02/2015 0549   PLT 165 06/02/2015 0549   MCV 93.8 06/02/2015 0549   MCH 30.3 06/02/2015 0549   MCHC 32.3 06/02/2015 0549   RDW 12.8 06/02/2015 0549   LYMPHSABS 1.0 05/30/2015 1030   MONOABS 1.9* 05/30/2015 1030   EOSABS 0.0 05/30/2015 1030   BASOSABS 0.0 05/30/2015 1030   HD orders: [facility] , MWF; EDW 82.5kg (post weight was 76.6 kg yesterday); time 4hours; 3K/2.25Ca; BFR400/DFR600;  Hectoral 3.32mcg tiw; Epo 2600 units tiw; no heparin  Assessment: 1. HCAP 2. ESRD 3. HTN 4. CKD-MBD 5. Anemia of chronic disease 6. Hyperkalemia   Assessment/Plan: 1. Continue with antibiotics 2. HD qMWF and challenging edw (came in below and UF 2L yesterday), continue to challenge (new EDW 76.5kg) 3. Hyperkalemia today- continue with 2K HD but may need to evaluate AVF if hyperkalemia persists and cont with renal diet  Kianni Lheureux A

## 2015-06-02 NOTE — Progress Notes (Signed)
[facility] PROGRESS NOTE  [name] [id number] DOB: [birth year] DOA: 05/29/2015 PCP: [name], MD  Assessment/Plan: #1 probable healthcare associated pneumonia Patient presenting clinically with symptoms consistent with a pneumonia with productive cough of yellowish sputum, worsening shortness of breath, febrile with a temperature 101 and a leukocytosis of 17.8. Patient with clinical improvement. Blood cultures pending. Sputum Gram stain and culture pending. Urine Legionella antigen pending. Urine pneumococcus antigen pending. Patient with a max temp of 99.4 last night, however fever curve trending down.  Discontinue IV cefepime. Start oral Augmentin. Continue Duonebs, Mucinex, Flonase and Claritin. Follow.  #2 diabetes mellitus type 2 Check a hemoglobin A1c. CBG ranging from 273 - 346. Continue sliding scale insulin. Increase NPH 15 units twice daily. Follow.  #3 hypertension Continue labetalol.  #4 end-stage renal disease on hemodialysis Per renal.  #5 hyperkalemia Resolved with hemodialysis. Follow.  #6 prophylaxis Heparin for DVT prophylaxis.   Code Status: Full Family Communication: Updated patient and family at bedside. Disposition Plan: Home when medically stable, hopefully 1-2 days.   Consultants:  Nephrology: Dr. Posey Pronto 05/29/2015  Procedures:  Chest x-ray 05/29/2015  Antibiotics:  IV cefepime 05/29/2015>>>>> 06/02/2015  IV vancomycin 05/29/2015>>>>> 06/01/2015  Oral Augmentin 06/02/2015  HPI/Subjective: Patient with improvement with shortness of breath.  Objective: Filed Vitals:   06/02/15 0500 06/02/15 1036  BP: 150/55 136/59  Pulse: 73 77  Temp: 98 F (36.7 C) 97.8 F (36.6 C)  Resp: 18 18    Intake/Output Summary (Last 24 hours) at 06/02/15 1527 Last data filed at 06/02/15 1300  Gross per 24 hour  Intake    240 ml  Output      0 ml  Net    240 ml   Filed Weights   06/01/15 0640 06/01/15 1056 06/01/15 2100   Weight: 78.2 kg (172 lb 6.4 oz) 76.6 kg (168 lb 14 oz) 77 kg (169 lb 12.1 oz)    Exam:   General:  NAD  Cardiovascular: RRR  Respiratory: Decreased Coarse scattered breath sounds. Some rhonchi noted. Minimal wheezing.  Abdomen: Soft, nontender, nondistended, positive bowel sounds.  Musculoskeletal: No clubbing cyanosis or edema.  Data Reviewed: Basic Metabolic Panel:  Recent Labs Lab 05/29/15 1458 05/30/15 1030 05/31/15 0655 06/01/15 0700 06/02/15 0549  NA 136 135 137 135 134*  K 5.7* 5.0 5.4* 3.8 5.0  CL 98* 100* 97* 96* 94*  CO2 24 23 25 25 25   GLUCOSE 312* 228* 195* 133* 238*  BUN 40* 56* 37* 51* 30*  CREATININE 8.43* 10.02* 7.13* 9.80* 6.44*  CALCIUM 8.5* 8.2* 8.1* 7.7* 8.0*  PHOS  --  5.2* 4.2 4.2 3.7   Liver Function Tests:  Recent Labs Lab 05/30/15 1030 05/31/15 0655 06/01/15 0700 06/02/15 0549  ALBUMIN 3.0* 2.8* 2.5* 2.7*   No results for input(s): LIPASE, AMYLASE in the last 168 hours. No results for input(s): AMMONIA in the last 168 hours. CBC:  Recent Labs Lab 05/29/15 1458 05/30/15 1030 05/31/15 0655 06/01/15 0700 06/02/15 0549  WBC 17.8* 19.4* 17.4* 15.2* 12.7*  NEUTROABS  --  16.4*  --   --   --   HGB 12.0 11.2* 11.3* 11.0* 11.8*  HCT 37.7 33.7* 35.1* 32.7* 36.5  MCV 97.2 94.9 95.6 93.4 93.8  PLT 121* 127* 149* 143* 165   Cardiac Enzymes: No results for input(s): CKTOTAL, CKMB, CKMBINDEX, TROPONINI in the last 168 hours. BNP (last 3 results) No results for input(s): BNP in the last 8760 hours.  ProBNP (last 3 results) No  results for input(s): PROBNP in the last 8760 hours.  CBG:  Recent Labs Lab 06/01/15 1154 06/01/15 1712 06/01/15 2112 06/02/15 0756 06/02/15 1127  GLUCAP 132* 155* 202* 254* 273*    Recent Results (from the past 240 hour(s))  Culture, blood (routine x 2) Call MD if unable to obtain prior to antibiotics being given     Status: None (Preliminary result)   Collection Time: 05/29/15  9:39 PM  Result  Value Ref Range Status   Specimen Description BLOOD LEFT ARM  Final   Special Requests IN PEDIATRIC BOTTLE 4CC  Final   Culture NO GROWTH 4 DAYS  Final   Report Status PENDING  Incomplete  Culture, blood (routine x 2) Call MD if unable to obtain prior to antibiotics being given     Status: None (Preliminary result)   Collection Time: 05/29/15  9:52 PM  Result Value Ref Range Status   Specimen Description BLOOD LEFT HAND  Final   Special Requests IN PEDIATRIC BOTTLE 2CC  Final   Culture NO GROWTH 4 DAYS  Final   Report Status PENDING  Incomplete     Studies: No results found.  Scheduled Meds: . amoxicillin-clavulanate  1 tablet Oral Q12H  . atorvastatin  20 mg Oral q1800  . fluticasone  2 spray Each Nare Daily  . guaiFENesin  1,200 mg Oral BID  . heparin  5,000 Units Subcutaneous 3 times per day  . insulin aspart  0-9 Units Subcutaneous TID WC  . insulin NPH Human  10 Units Subcutaneous BID AC & HS  . ipratropium-albuterol  3 mL Nebulization Q6H  . labetalol  300 mg Oral BID  . loratadine  10 mg Oral Daily  . multivitamin  1 tablet Oral QHS  . predniSONE  60 mg Oral QAC breakfast  . sodium chloride  3 mL Intravenous Q12H   Continuous Infusions:   Principal Problem:   HCAP (healthcare-associated pneumonia) Active Problems:   Diabetes mellitus, type 2 (HCC)   Leukocytosis   Hypertension   End stage renal disease (HCC)   Hyperkalemia   DM type 2, goal A1c below 7    Time spent: 65 minutes    THOMPSON,DANIEL M.D. [facility] Pager [phone]. If 7PM-7AM, please contact night-coverage at [email or web address], password [facility] 06/02/2015, 3:27 PM  LOS: 4 days

## 2015-06-03 LAB — RENAL FUNCTION PANEL
ALBUMIN: 2.6 g/dL — AB (ref 3.5–5.0)
Anion gap: 13 (ref 5–15)
BUN: 53 mg/dL — AB (ref 6–20)
CALCIUM: 8 mg/dL — AB (ref 8.9–10.3)
CHLORIDE: 96 mmol/L — AB (ref 101–111)
CO2: 24 mmol/L (ref 22–32)
CREATININE: 8.93 mg/dL — AB (ref 0.44–1.00)
GFR, EST AFRICAN AMERICAN: 4 mL/min — AB (ref >60)
GFR, EST NON AFRICAN AMERICAN: 4 mL/min — AB (ref >60)
Glucose, Bld: 207 mg/dL — ABNORMAL HIGH (ref 65–99)
PHOSPHORUS: 4.2 mg/dL (ref 2.5–4.6)
Potassium: 4.7 mmol/L (ref 3.5–5.1)
SODIUM: 133 mmol/L — AB (ref 135–145)

## 2015-06-03 LAB — CULTURE, BLOOD (ROUTINE X 2)
CULTURE: NO GROWTH
Culture: NO GROWTH

## 2015-06-03 LAB — GLUCOSE, CAPILLARY
GLUCOSE-CAPILLARY: 150 mg/dL — AB (ref 65–99)
GLUCOSE-CAPILLARY: 178 mg/dL — AB (ref 65–99)

## 2015-06-03 MED ORDER — GUAIFENESIN ER 600 MG PO TB12: 1200.0000 mg | ORAL_TABLET | Freq: Two times a day (BID) | ORAL | Status: DC

## 2015-06-03 MED ORDER — ALBUTEROL SULFATE HFA 108 (90 BASE) MCG/ACT IN AERS: 1.0000 | INHALATION_SPRAY | Freq: Four times a day (QID) | RESPIRATORY_TRACT | Status: DC | PRN

## 2015-06-03 MED ORDER — LORATADINE 10 MG PO TABS: 10.0000 mg | ORAL_TABLET | Freq: Every day | ORAL | Status: DC

## 2015-06-03 MED ORDER — AMOXICILLIN-POT CLAVULANATE 500-125 MG PO TABS: 1.0000 | ORAL_TABLET | ORAL | Status: DC

## 2015-06-03 MED ORDER — PREDNISONE 20 MG PO TABS: 20.0000 mg | ORAL_TABLET | Freq: Every day | ORAL | Status: DC

## 2015-06-03 MED ORDER — FLUTICASONE PROPIONATE 50 MCG/ACT NA SUSP: 2.0000 | Freq: Every day | NASAL | Status: DC

## 2015-06-03 NOTE — Discharge Summary (Signed)
Physician Discharge Summary  [name] [id number] DOB: 02/12/1941 DOA: 05/29/2015  PCP: [name], MD  Admit date: 05/29/2015 Discharge date: 06/03/2015  Time spent: 65 minutes  Recommendations for Outpatient Follow-up:  1. Follow-up with HILL,Barbara K, MD in 1-2 weeks. Patient's pneumonia needs to be followed up upon. 2. Follow-up at hemodialysis center tomorrow 06/04/2015 for usual hemodialysis.   Discharge Diagnoses:  Principal Problem:   HCAP (healthcare-associated pneumonia) Active Problems:   Diabetes mellitus, type 2 (HCC)   Leukocytosis   Hypertension   End stage renal disease (HCC)   Hyperkalemia   DM type 2, goal A1c below 7   Discharge Condition: Stable and improved  Diet recommendation: Carb modified diet.  Filed Weights   06/01/15 1056 06/01/15 2100 06/02/15 2100  Weight: 76.6 kg (168 lb 14 oz) 77 kg (169 lb 12.1 oz) 77.4 kg (170 lb 10.2 oz)    History of present illness:  Per Dr [name] is a 74 y.o. female with multiple medical problems not limited to hypertension, diabetes, history of stroke, and end-stage renal disease on hemodialysis M,W, F in [place]. Patient presented to the emergency department on day of admission, with a 3 day history of cough productive of yellow sputum / shortness of breath. She was febrile with a temp of 101. Patient otherwise felt okay.    Hospital Course:  #1 probable healthcare associated pneumonia Patient presented clinically with symptoms consistent with a pneumonia with productive cough of yellowish sputum, worsening shortness of breath, febrile with a temperature 101 and a leukocytosis of 17.8. Patient was admitted and pancultured. Blood cultures with no growth to date. Sputum Gram stain and culture was pending at time of discharge. Patient was initially placed empirically on IV vancomycin and IV cefepime. Patient was also placed on DuoNeb's, Mucinex, Flonase and Claritin. Patient  improved clinically during the hospitalization and IV steroids was subsequently transitioned to oral prednisone. IV antibiotics also transitioned to oral Augmentin which patient tolerated. Patient be discharged home on 4 more days of oral antibiotics to complete a course of antibiotic treatment. She would also be discharged home on the Mucinex Flonase and Claritin. Patient will follow-up with PCP as outpatient.   #2 diabetes mellitus type 2 Hemoglobin A1c done during this hospitalization was 9.8. Patient was maintained on NPH twice daily as well as sliding scale insulin. NPH was increased to 15 units twice daily when patient was placed on steroids and will be discharged back on her home regimen as steroids are being tapered down. Outpatient follow-up.  #3 hypertension Continued on labetalol.  #4 end-stage renal disease on hemodialysis Per renal. Patient was seen in consultation by nephrology during the hospitalization and had hemodialysis on her scheduled days of Monday Wednesday and Fridays. Patient will follow-up in outpatient hemodialysis center on discharge.  #5 hyperkalemia Resolved with hemodialysis.   Procedures:  Chest x-ray 05/29/2015  Consultations:  Nephrology: Dr. Posey Pronto 05/29/2015  Discharge Exam: Filed Vitals:   06/03/15 0500 06/03/15 0822  BP: 138/63 120/58  Pulse: 72 72  Temp: 97.3 F (36.3 C) 97.6 F (36.4 C)  Resp: 18 16    General: NAD Cardiovascular: RRR Respiratory: ctab  Discharge Instructions   Discharge Instructions    Diet Carb Modified    Complete by:  As directed      Discharge instructions    Complete by:  As directed   Follow up with HILL,Barbara K, MD in 1-2 weeks. Follow up at HD center tomorrow 06/04/2015.  Increase activity slowly    Complete by:  As directed           Current Discharge Medication List    START taking these medications   Details  amoxicillin-clavulanate (AUGMENTIN) 500-125 MG tablet Take 1 tablet (500 mg  total) by mouth daily. Take for 4 days then stop. Qty: 4 tablet, Refills: 0    fluticasone (FLONASE) 50 MCG/ACT nasal spray Place 2 sprays into both nostrils daily. Use for 5 days then as needed. Qty: 16 g, Refills: 0    guaiFENesin (MUCINEX) 600 MG 12 hr tablet Take 2 tablets (1,200 mg total) by mouth 2 (two) times daily. Take for 5 days then stop. Qty: 20 tablet, Refills: 0    loratadine (CLARITIN) 10 MG tablet Take 1 tablet (10 mg total) by mouth daily. Qty: 30 tablet, Refills: 0    predniSONE (DELTASONE) 20 MG tablet Take 1-3 tablets (20-60 mg total) by mouth daily before breakfast. Take 3 tablets (60mg ) daily x 1 day, then 2 tablets (40mg )daily x 3 days, then 1 tablet (20mg )daily x 3 days then stop. Qty: 12 tablet, Refills: 0      CONTINUE these medications which have CHANGED   Details  albuterol (PROVENTIL HFA;VENTOLIN HFA) 108 (90 BASE) MCG/ACT inhaler Inhale 1-2 puffs into the lungs every 6 (six) hours as needed for wheezing. Use 2 puffs 3 times daily x 4 days then as needed. Qty: 1 Inhaler, Refills: 0      CONTINUE these medications which have NOT CHANGED   Details  atorvastatin (LIPITOR) 20 MG tablet Take 1 tablet by mouth daily.    cyanocobalamin (,VITAMIN B-12,) 1000 MCG/ML injection Inject 1 mL into the muscle every 30 (thirty) days. Refills: 11    ibuprofen (ADVIL,MOTRIN) 200 MG tablet Take 200-400 mg by mouth every 6 (six) hours as needed for moderate pain.     insulin NPH Human (HUMULIN N,NOVOLIN N) 100 UNIT/ML injection Inject 10 Units into the skin 2 (two) times daily before a meal.    labetalol (NORMODYNE) 300 MG tablet Take 300 mg by mouth 2 (two) times daily. Refills: 3    meclizine (ANTIVERT) 25 MG tablet Take 1 tablet by mouth every 6 (six) hours as needed for dizziness.  Refills: 0    multivitamin (RENA-VIT) TABS tablet Take 1 tablet by mouth at bedtime. Qty: 30 tablet, Refills: 0       Allergies  Allergen Reactions  . Ambien [Zolpidem] Other (See  Comments)    Hallucinations   . Reglan [Metoclopramide] Other (See Comments)    Hallucinations   . Reglan [Metoclopramide]     "makes me crazy"   Follow-up Information    Follow up with HILL,Barbara K, MD. Schedule an appointment as soon as possible for a visit in 1 week.   Specialty:  Family Medicine   Why:  f/u in 1-2 weeks.   Contact information:   [address] [place] [address] [phone]       Follow up On 06/04/2015.   Why:  F/U in at dialysis unit .       The results of significant diagnostics from this hospitalization (including imaging, microbiology, ancillary and laboratory) are listed below for reference.    Significant Diagnostic Studies: Dg Chest 2 View  05/29/2015  CLINICAL DATA:  Cough for 4 days. Progressive shortness of breath. Tightness in the chest. Fever at 101.0 degrees. EXAM: CHEST - 2 VIEW COMPARISON:  Two-view chest x-ray 07/12/2014. FINDINGS: Cardiomegaly and mild  pulmonary vascular congestion are noted. There are no significant effusions. No focal airspace disease is present. An implanted loop recorder is again noted. A stent is noted in the right upper extremity. The visualized soft tissues and bony thorax are otherwise unremarkable. IMPRESSION: 1. Cardiomegaly and mild pulmonary vascular congestion, potentially representing early congestive heart failure. 2. No focal airspace disease. Electronically Signed   By: San Morelle M.D.   On: 05/29/2015 14:55    Microbiology: Recent Results (from the past 240 hour(s))  Culture, blood (routine x 2) Call MD if unable to obtain prior to antibiotics being given     Status: None   Collection Time: 05/29/15  9:39 PM  Result Value Ref Range Status   Specimen Description BLOOD LEFT ARM  Final   Special Requests IN PEDIATRIC BOTTLE 4CC  Final   Culture NO GROWTH 5 DAYS  Final   Report Status 06/03/2015 FINAL  Final  Culture, blood (routine x 2) Call MD if unable to obtain prior to antibiotics  being given     Status: None   Collection Time: 05/29/15  9:52 PM  Result Value Ref Range Status   Specimen Description BLOOD LEFT HAND  Final   Special Requests IN PEDIATRIC BOTTLE [facility]  Final   Culture NO GROWTH 5 DAYS  Final   Report Status 06/03/2015 FINAL  Final     Labs: Basic Metabolic Panel:  Recent Labs Lab 05/30/15 1030 05/31/15 0655 06/01/15 0700 06/02/15 0549 06/03/15 0530  NA 135 137 135 134* 133*  Howard 5.0 5.4* 3.8 5.0 4.7  CL 100* 97* 96* 94* 96*  CO2 23 25 25 25 24   GLUCOSE 228* 195* 133* 238* 207*  BUN 56* 37* 51* 30* 53*  CREATININE 10.02* 7.13* 9.80* 6.44* 8.93*  CALCIUM 8.2* 8.1* 7.7* 8.0* 8.0*  PHOS 5.2* 4.2 4.2 3.7 4.2   Liver Function Tests:  Recent Labs Lab 05/30/15 1030 05/31/15 0655 06/01/15 0700 06/02/15 0549 06/03/15 0530  ALBUMIN 3.0* 2.8* 2.5* 2.7* 2.6*   No results for input(s): LIPASE, AMYLASE in the last 168 hours. No results for input(s): AMMONIA in the last 168 hours. CBC:  Recent Labs Lab 05/29/15 1458 05/30/15 1030 05/31/15 0655 06/01/15 0700 06/02/15 0549  WBC 17.8* 19.4* 17.4* 15.2* 12.7*  NEUTROABS  --  16.4*  --   --   --   HGB 12.0 11.2* 11.3* 11.0* 11.8*  HCT 37.7 33.7* 35.1* 32.7* 36.5  MCV 97.2 94.9 95.6 93.4 93.8  PLT 121* 127* 149* 143* 165   Cardiac Enzymes: No results for input(s): CKTOTAL, CKMB, CKMBINDEX, TROPONINI in the last 168 hours. BNP: BNP (last 3 results) No results for input(s): BNP in the last 8760 hours.  ProBNP (last 3 results) No results for input(s): PROBNP in the last 8760 hours.  CBG:  Recent Labs Lab 06/02/15 1127 06/02/15 1650 06/02/15 2231 06/03/15 0821 06/03/15 1146  GLUCAP 273* 346* 253* 150* 178*       Signed:  Evalyn Shultis MD [facility] 06/03/2015, 3:16 PM

## 2015-06-03 NOTE — Progress Notes (Signed)
Patient ID: [name], female   DOB: [birth year], 74 y.o.   MRN: [id number] S:feels better O:BP 120/58 mmHg  Pulse 72  Temp(Src) 97.6 F (36.4 C) (Oral)  Resp 16  Ht 5\' 6"  (1.676 m)  Wt 77.4 kg (170 lb 10.2 oz)  BMI 27.55 kg/m2  SpO2 100%  Intake/Output Summary (Last 24 hours) at 06/03/15 0846 Last data filed at 06/02/15 1856  Gross per 24 hour  Intake    480 ml  Output      0 ml  Net    480 ml   Intake/Output: I/O last 3 completed shifts: In: 480 [P.O.:480] Out: -   Intake/Output this shift:    Weight change: 0.8 kg (1 lb 12.2 oz) Gen:WD WN AAF in NAD CVS:no rub Resp:cta [id number] Ext:no edema, RUE AVF +T/B   Recent Labs Lab 05/29/15 1458 05/30/15 1030 05/31/15 0655 06/01/15 0700 06/02/15 0549 06/03/15 0530  NA 136 135 137 135 134* 133*  K 5.7* 5.0 5.4* 3.8 5.0 4.7  CL 98* 100* 97* 96* 94* 96*  CO2 24 23 25 25 25 24   GLUCOSE 312* 228* 195* 133* 238* 207*  BUN 40* 56* 37* 51* 30* 53*  CREATININE 8.43* 10.02* 7.13* 9.80* 6.44* 8.93*  ALBUMIN  --  3.0* 2.8* 2.5* 2.7* 2.6*  CALCIUM 8.5* 8.2* 8.1* 7.7* 8.0* 8.0*  PHOS  --  5.2* 4.2 4.2 3.7 4.2   Liver Function Tests:  Recent Labs Lab 06/01/15 0700 06/02/15 0549 06/03/15 0530  ALBUMIN 2.5* 2.7* 2.6*   No results for input(s): LIPASE, AMYLASE in the last 168 hours. No results for input(s): AMMONIA in the last 168 hours. CBC:  Recent Labs Lab 05/29/15 1458 05/30/15 1030 05/31/15 0655 06/01/15 0700 06/02/15 0549  WBC 17.8* 19.4* 17.4* 15.2* 12.7*  NEUTROABS  --  16.4*  --   --   --   HGB 12.0 11.2* 11.3* 11.0* 11.8*  HCT 37.7 33.7* 35.1* 32.7* 36.5  MCV 97.2 94.9 95.6 93.4 93.8  PLT 121* 127* 149* 143* 165   Cardiac Enzymes: No results for input(s): CKTOTAL, CKMB, CKMBINDEX, TROPONINI in the last 168 hours. CBG:  Recent Labs Lab 06/02/15 0756 06/02/15 1127 06/02/15 1650 06/02/15 2231 06/03/15 0821  GLUCAP 254* 273* 346* 253* 150*    Iron Studies: No results for input(s):  IRON, TIBC, TRANSFERRIN, FERRITIN in the last 72 hours. Studies/Results: No results found. Marland Kitchen amoxicillin-clavulanate  1 tablet Oral Q24H  . atorvastatin  20 mg Oral q1800  . fluticasone  2 spray Each Nare Daily  . guaiFENesin  1,200 mg Oral BID  . heparin  5,000 Units Subcutaneous 3 times per day  . insulin aspart  0-9 Units Subcutaneous TID WC  . insulin NPH Human  15 Units Subcutaneous BID AC & HS  . ipratropium-albuterol  3 mL Nebulization Q6H  . labetalol  300 mg Oral BID  . loratadine  10 mg Oral Daily  . multivitamin  1 tablet Oral QHS  . predniSONE  60 mg Oral QAC breakfast  . sodium chloride  3 mL Intravenous Q12H    BMET    Component Value Date/Time   NA 133* 06/03/2015 0530   K 4.7 06/03/2015 0530   CL 96* 06/03/2015 0530   CO2 24 06/03/2015 0530   GLUCOSE 207* 06/03/2015 0530   BUN 53* 06/03/2015 0530   CREATININE 8.93* 06/03/2015 0530   CALCIUM 8.0* 06/03/2015 0530   GFRNONAA 4* 06/03/2015 0530   GFRAA 4* 06/03/2015 0530  CBC    Component Value Date/Time   WBC 12.7* 06/02/2015 0549   RBC 3.89 06/02/2015 0549   HGB 11.8* 06/02/2015 0549   HCT 36.5 06/02/2015 0549   PLT 165 06/02/2015 0549   MCV 93.8 06/02/2015 0549   MCH 30.3 06/02/2015 0549   MCHC 32.3 06/02/2015 0549   RDW 12.8 06/02/2015 0549   LYMPHSABS 1.0 05/30/2015 1030   MONOABS 1.9* 05/30/2015 1030   EOSABS 0.0 05/30/2015 1030   BASOSABS 0.0 05/30/2015 1030   HD orders: [facility] [place], MWF; EDW 82.5kg (post weight was 76.6 kg yesterday); time 4hours; 3K/2.25Ca; BFR400/DFR600;  Hectoral 3.68mcg tiw; Epo 2600 units tiw; no heparin  Assessment: 1. HCAP 2. ESRD 3. HTN 4. CKD-MBD 5. Anemia of chronic disease 6. Hyperkalemia  Assessment/Plan: 1. Continue with antibiotics 2. HD qMWF and challenging edw (came in below and UF 2L yesterday), continue to challenge (new EDW 76.5kg) 3. Hyperkalemia - continue with 2K HD but may need to evaluate AVF if hyperkalemia persists and cont with renal  diet 4. Disposition- if pt to be discharged today will f/u with outpt HD tomorrow at her home unit with new EDW otherwise will plan for HD tomorrow here if she remains an inpatient.  [place] A

## 2015-06-03 NOTE — Progress Notes (Signed)
Discharge instructions and medications discussed with patient.  Prescriptions given to patient.  All questions answered.  

## 2015-06-03 NOTE — Care Management Important Message (Signed)
Important Message  Patient Details  Name: [name] MRN: [id number] Date of Birth: 10/16/1940   Medicare Important Message Given:  Yes    Laurena Slimmer, RN 06/03/2015, 4:39 PM

## 2015-06-04 DIAGNOSIS — D509 Iron deficiency anemia, unspecified: Secondary | ICD-10-CM | POA: Diagnosis not present

## 2015-06-04 DIAGNOSIS — Z992 Dependence on renal dialysis: Secondary | ICD-10-CM | POA: Diagnosis not present

## 2015-06-04 DIAGNOSIS — D631 Anemia in chronic kidney disease: Secondary | ICD-10-CM | POA: Diagnosis not present

## 2015-06-04 DIAGNOSIS — N186 End stage renal disease: Secondary | ICD-10-CM | POA: Diagnosis not present

## 2015-06-04 DIAGNOSIS — N2581 Secondary hyperparathyroidism of renal origin: Secondary | ICD-10-CM | POA: Diagnosis not present

## 2015-06-05 DIAGNOSIS — Z992 Dependence on renal dialysis: Secondary | ICD-10-CM | POA: Diagnosis not present

## 2015-06-05 DIAGNOSIS — Z794 Long term (current) use of insulin: Secondary | ICD-10-CM | POA: Diagnosis not present

## 2015-06-05 DIAGNOSIS — J189 Pneumonia, unspecified organism: Secondary | ICD-10-CM | POA: Diagnosis not present

## 2015-06-05 DIAGNOSIS — N186 End stage renal disease: Secondary | ICD-10-CM | POA: Diagnosis not present

## 2015-06-05 DIAGNOSIS — E875 Hyperkalemia: Secondary | ICD-10-CM | POA: Diagnosis not present

## 2015-06-05 DIAGNOSIS — E1122 Type 2 diabetes mellitus with diabetic chronic kidney disease: Secondary | ICD-10-CM | POA: Diagnosis not present

## 2015-06-05 DIAGNOSIS — I120 Hypertensive chronic kidney disease with stage 5 chronic kidney disease or end stage renal disease: Secondary | ICD-10-CM | POA: Diagnosis not present

## 2015-06-06 DIAGNOSIS — N2581 Secondary hyperparathyroidism of renal origin: Secondary | ICD-10-CM | POA: Diagnosis not present

## 2015-06-06 DIAGNOSIS — D631 Anemia in chronic kidney disease: Secondary | ICD-10-CM | POA: Diagnosis not present

## 2015-06-06 DIAGNOSIS — D509 Iron deficiency anemia, unspecified: Secondary | ICD-10-CM | POA: Diagnosis not present

## 2015-06-06 DIAGNOSIS — N186 End stage renal disease: Secondary | ICD-10-CM | POA: Diagnosis not present

## 2015-06-06 DIAGNOSIS — Z992 Dependence on renal dialysis: Secondary | ICD-10-CM | POA: Diagnosis not present

## 2015-06-08 DIAGNOSIS — N186 End stage renal disease: Secondary | ICD-10-CM | POA: Diagnosis not present

## 2015-06-08 DIAGNOSIS — D509 Iron deficiency anemia, unspecified: Secondary | ICD-10-CM | POA: Diagnosis not present

## 2015-06-08 DIAGNOSIS — N2581 Secondary hyperparathyroidism of renal origin: Secondary | ICD-10-CM | POA: Diagnosis not present

## 2015-06-08 DIAGNOSIS — Z992 Dependence on renal dialysis: Secondary | ICD-10-CM | POA: Diagnosis not present

## 2015-06-08 DIAGNOSIS — D631 Anemia in chronic kidney disease: Secondary | ICD-10-CM | POA: Diagnosis not present

## 2015-06-11 DIAGNOSIS — N186 End stage renal disease: Secondary | ICD-10-CM | POA: Diagnosis not present

## 2015-06-11 DIAGNOSIS — N2581 Secondary hyperparathyroidism of renal origin: Secondary | ICD-10-CM | POA: Diagnosis not present

## 2015-06-11 DIAGNOSIS — D509 Iron deficiency anemia, unspecified: Secondary | ICD-10-CM | POA: Diagnosis not present

## 2015-06-11 DIAGNOSIS — Z992 Dependence on renal dialysis: Secondary | ICD-10-CM | POA: Diagnosis not present

## 2015-06-11 DIAGNOSIS — D631 Anemia in chronic kidney disease: Secondary | ICD-10-CM | POA: Diagnosis not present

## 2015-06-12 ENCOUNTER — Emergency Department (HOSPITAL_COMMUNITY)
Admission: EM | Admit: 2015-06-12 | Discharge: 2015-06-12 | Disposition: A | Discharge status: Home / Self Care | Payer: Medicare Other | Attending: Emergency Medicine | Admitting: Emergency Medicine

## 2015-06-12 ENCOUNTER — Encounter (HOSPITAL_COMMUNITY): Payer: Self-pay | Admitting: Vascular Surgery

## 2015-06-12 DIAGNOSIS — R251 Tremor, unspecified: Secondary | ICD-10-CM | POA: Insufficient documentation

## 2015-06-12 DIAGNOSIS — I129 Hypertensive chronic kidney disease with stage 1 through stage 4 chronic kidney disease, or unspecified chronic kidney disease: Secondary | ICD-10-CM | POA: Insufficient documentation

## 2015-06-12 DIAGNOSIS — N189 Chronic kidney disease, unspecified: Secondary | ICD-10-CM | POA: Insufficient documentation

## 2015-06-12 DIAGNOSIS — Z794 Long term (current) use of insulin: Secondary | ICD-10-CM | POA: Diagnosis not present

## 2015-06-12 DIAGNOSIS — Z79899 Other long term (current) drug therapy: Secondary | ICD-10-CM | POA: Insufficient documentation

## 2015-06-12 DIAGNOSIS — Z8673 Personal history of transient ischemic attack (TIA), and cerebral infarction without residual deficits: Secondary | ICD-10-CM | POA: Insufficient documentation

## 2015-06-12 DIAGNOSIS — Z992 Dependence on renal dialysis: Secondary | ICD-10-CM | POA: Diagnosis not present

## 2015-06-12 DIAGNOSIS — Z8669 Personal history of other diseases of the nervous system and sense organs: Secondary | ICD-10-CM | POA: Diagnosis not present

## 2015-06-12 DIAGNOSIS — Z862 Personal history of diseases of the blood and blood-forming organs and certain disorders involving the immune mechanism: Secondary | ICD-10-CM | POA: Insufficient documentation

## 2015-06-12 DIAGNOSIS — E119 Type 2 diabetes mellitus without complications: Secondary | ICD-10-CM | POA: Diagnosis not present

## 2015-06-12 LAB — COMPREHENSIVE METABOLIC PANEL
ALT: 25 U/L (ref 14–54)
AST: 23 U/L (ref 15–41)
Albumin: 2.9 g/dL — ABNORMAL LOW (ref 3.5–5.0)
Alkaline Phosphatase: 64 U/L (ref 38–126)
Anion gap: 13 (ref 5–15)
BUN: 31 mg/dL — ABNORMAL HIGH (ref 6–20)
CHLORIDE: 102 mmol/L (ref 101–111)
CO2: 25 mmol/L (ref 22–32)
CREATININE: 6.76 mg/dL — AB (ref 0.44–1.00)
Calcium: 7.7 mg/dL — ABNORMAL LOW (ref 8.9–10.3)
GFR calc non Af Amer: 5 mL/min — ABNORMAL LOW (ref >60)
GFR, EST AFRICAN AMERICAN: 6 mL/min — AB (ref >60)
Glucose, Bld: 148 mg/dL — ABNORMAL HIGH (ref 65–99)
POTASSIUM: 4.8 mmol/L (ref 3.5–5.1)
SODIUM: 140 mmol/L (ref 135–145)
Total Bilirubin: 0.6 mg/dL (ref 0.3–1.2)
Total Protein: 6 g/dL — ABNORMAL LOW (ref 6.5–8.1)

## 2015-06-12 LAB — CBC
HEMATOCRIT: 36.1 % (ref 36.0–46.0)
Hemoglobin: 11.6 g/dL — ABNORMAL LOW (ref 12.0–15.0)
MCH: 30.5 pg (ref 26.0–34.0)
MCHC: 32.1 g/dL (ref 30.0–36.0)
MCV: 95 fL (ref 78.0–100.0)
PLATELETS: 178 10*3/uL (ref 150–400)
RBC: 3.8 MIL/uL — AB (ref 3.87–5.11)
RDW: 14.1 % (ref 11.5–15.5)
WBC: 14.3 10*3/uL — ABNORMAL HIGH (ref 4.0–10.5)

## 2015-06-12 NOTE — ED Provider Notes (Signed)
CSN: [id number]     Arrival date & time 06/12/15  1328 History   First MD Initiated Contact with Patient 06/12/15 1750     Chief Complaint  Patient presents with  . Shaking     (Consider location/radiation/quality/duration/timing/severity/associated sxs/prior Treatment) Patient is a 74 y.o. female presenting with neurologic complaint. The history is provided by the patient and a relative.  Neurologic Problem This is a new problem. The problem occurs intermittently. The problem has been unchanged. Pertinent negatives include no abdominal pain, anorexia, arthralgias, change in bowel habit, chest pain, chills, congestion, coughing, diaphoresis, fatigue, fever, headaches, joint swelling, myalgias, nausea, neck pain, numbness, rash, sore throat, swollen glands, urinary symptoms, vertigo, visual change, vomiting or weakness. Nothing aggravates the symptoms. She has tried nothing for the symptoms. The treatment provided no relief.    Past Medical History  Diagnosis Date  . Cataract   . Chronic kidney disease   . Shortness of breath   . Anemia   . CVA (cerebral infarction)   . Dialysis patient ([place])   . Diabetes mellitus     Type 2  . Stroke [facility])     right side weakness  . GERD (gastroesophageal reflux disease)   . Renal disorder   . Diabetes mellitus without complication ([place])   . Hypertension   . Dialysis patient ([place])     M, W, F  . Fistula     R arm   Past Surgical History  Procedure Laterality Date  . Cataract extraction w/phaco  11/20/2011    Procedure: CATARACT EXTRACTION PHACO AND INTRAOCULAR LENS PLACEMENT (IOC);  Surgeon: Tonny Branch, MD;  Location: [facility];  Service: Ophthalmology;  Laterality: Right;  CDE 18.82  . Cataract extraction w/phaco Left 11/18/2012    Procedure: CATARACT EXTRACTION PHACO AND INTRAOCULAR LENS PLACEMENT (IOC);  Surgeon: Tonny Branch, MD;  Location: [facility];  Service: Ophthalmology;  Laterality: Left;  CDE: 18.97  . Colonoscopy N/A 02/09/2013     Procedure: COLONOSCOPY;  Surgeon: Rogene Houston, MD;  Location: [facility];  Service: Endoscopy;  Laterality: N/A;  305-moved to 220 Ann to notify pt  . Insertion of dialysis catheter Right 06/24/2013    Procedure: INSERTION OF DIALYSIS CATHETER: Ultrasound guided;  Surgeon: Serafina Mitchell, MD;  Location: [place];  Service: Vascular;  Laterality: Right;  . 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 Tee without cardioversion N/A 07/21/2013    Procedure: TRANSESOPHAGEAL ECHOCARDIOGRAM (TEE);  Surgeon: Dorothy Spark, MD;  Location: [place];  Service: Cardiovascular;  Laterality: N/A;  . Av fistula placement Right 09/08/2013    Procedure: CREATION OF RIGHT BRACHIAL CEPHALIC ARTERIOVENOUS FISTULA ;  Surgeon: Mal Misty, MD;  Location: [place];  Service: Vascular;  Laterality: Right;  . Bascilic vein transposition Right 01/26/2014    Procedure: Right Arm BASILIC VEIN TRANSPOSITION;  Surgeon: Mal Misty, MD;  Location: [place];  Service: Vascular;  Laterality: Right;  . Ligation of arteriovenous  fistula Right 01/26/2014    Procedure: LIGATION OF ARTERIOVENOUS  FISTULA;  Surgeon: Mal Misty, MD;  Location: [place];  Service: Vascular;  Laterality: Right;  . Loop recorder implant N/A 07/21/2013    Procedure: LOOP RECORDER IMPLANT;  Surgeon: Evans Lance, MD;  Location[facility] CATH LAB;  Service: Cardiovascular;  Laterality: N/A;   Family History  Problem Relation Age of Onset  . Anesthesia problems Neg Hx   . Hypotension  Neg Hx   . Malignant hyperthermia Neg Hx   . Pseudochol deficiency Neg Hx   . Cancer Sister   . Cancer Brother    Social History  Substance Use Topics  . Smoking status: Never Smoker   . Smokeless tobacco: None  . Alcohol Use: No   OB History    Gravida Para Term Preterm AB TAB SAB Ectopic Multiple Living   0 0 0 0 0 0 0 0       Review of Systems  Constitutional: Negative for fever, chills, diaphoresis and fatigue.   HENT: Negative for congestion and sore throat.   Eyes: Negative for pain.  Respiratory: Negative for cough.   Cardiovascular: Negative for chest pain.  Gastrointestinal: Negative for nausea, vomiting, abdominal pain, anorexia and change in bowel habit.  Musculoskeletal: Negative for myalgias, joint swelling, arthralgias and neck pain.  Skin: Negative for rash.  Neurological: Positive for tremors. Negative for dizziness, vertigo, seizures, facial asymmetry, speech difficulty, weakness, light-headedness, numbness and headaches.  Psychiatric/Behavioral: Negative for confusion.      Allergies  Ambien; Reglan; and Reglan  Home Medications   Prior to Admission medications   Medication Sig Start Date End Date Taking? Authorizing Provider  albuterol (PROVENTIL HFA;VENTOLIN HFA) 108 (90 BASE) MCG/ACT inhaler Inhale 1-2 puffs into the lungs every 6 (six) hours as needed for wheezing. Use 2 puffs 3 times daily x 4 days then as needed. 06/03/15  Yes Eugenie Filler, MD  atorvastatin (LIPITOR) 20 MG tablet Take 1 tablet by mouth daily. 11/09/14  Yes Historical Provider, MD  cyanocobalamin (,VITAMIN B-12,) 1000 MCG/ML injection Inject 1 mL into the muscle every 30 (thirty) days. 11/01/14  Yes Historical Provider, MD  ibuprofen (ADVIL,MOTRIN) 200 MG tablet Take 200-400 mg by mouth every 6 (six) hours as needed for moderate pain.    Yes Historical Provider, MD  insulin NPH Human (HUMULIN N,NOVOLIN N) 100 UNIT/ML injection Inject 10 Units into the skin 2 (two) times daily before a meal.   Yes Historical Provider, MD  labetalol (NORMODYNE) 300 MG tablet Take 300 mg by mouth 2 (two) times daily. 11/06/14  Yes Historical Provider, MD  meclizine (ANTIVERT) 25 MG tablet Take 1 tablet by mouth every 6 (six) hours as needed for dizziness.  11/07/14  Yes Historical Provider, MD  multivitamin (RENA-VIT) TABS tablet Take 1 tablet by mouth at bedtime. 08/21/14  Yes Charlett Blake, MD  amoxicillin-clavulanate  (AUGMENTIN) 500-125 MG tablet Take 1 tablet (500 mg total) by mouth daily. Take for 4 days then stop. 06/03/15   Eugenie Filler, MD  fluticasone [facility]) 50 MCG/ACT nasal spray Place 2 sprays into both nostrils daily. Use for 5 days then as needed. 06/03/15   Eugenie Filler, MD  guaiFENesin (MUCINEX) 600 MG 12 hr tablet Take 2 tablets (1,200 mg total) by mouth 2 (two) times daily. Take for 5 days then stop. 06/03/15   Eugenie Filler, MD  predniSONE (DELTASONE) 20 MG tablet Take 1-3 tablets (20-60 mg total) by mouth daily before breakfast. Take 3 tablets (60mg ) daily x 1 day, then 2 tablets (40mg )daily x 3 days, then 1 tablet (20mg )daily x 3 days then stop. 06/03/15   Eugenie Filler, MD   BP 102/90 mmHg  Pulse 74  Temp(Src) 98.7 F (37.1 C) (Oral)  Resp 16  SpO2 97% Physical Exam  Constitutional: She is oriented to person, place, and time. She appears well-developed and well-nourished. No distress.  HENT:  Head: Normocephalic and atraumatic.  Eyes: Conjunctivae and EOM are normal. Pupils are equal, round, and reactive to light.  Neck: Normal range of motion.  Cardiovascular: Normal rate and regular rhythm.  Exam reveals no gallop and no friction rub.   No murmur heard. Pulmonary/Chest: Effort normal and breath sounds normal. No respiratory distress. She has no wheezes. She has no rales. She exhibits no tenderness.  Abdominal: Soft. Bowel sounds are normal. She exhibits no distension. There is no tenderness. There is no rebound and no guarding.  Neurological: She is alert and oriented to person, place, and time. She has normal strength. No cranial nerve deficit or sensory deficit. GCS eye subscore is 4. GCS verbal subscore is 5. GCS motor subscore is 6.  Pt had shaking to bilateral upper extremities when holding arms out.  Normal finger to nose and heel to shin exam.  Able to ambulate without ataxia or other acute abnormality.    Skin: Skin is warm and dry. She is not diaphoretic.  No erythema.  Psychiatric: She has a normal mood and affect. Her behavior is normal. Thought content normal.    ED Course  Procedures (including critical care time) Labs Review Labs Reviewed  COMPREHENSIVE METABOLIC PANEL - Abnormal; Notable for the following:    Glucose, Bld 148 (*)    BUN 31 (*)    Creatinine, Ser 6.76 (*)    Calcium 7.7 (*)    Total Protein 6.0 (*)    Albumin 2.9 (*)    GFR calc non Af Amer 5 (*)    GFR calc Af Amer 6 (*)    All other components within normal limits  CBC - Abnormal; Notable for the following:    WBC 14.3 (*)    RBC 3.80 (*)    Hemoglobin 11.6 (*)    All other components within normal limits    Imaging Review No results found. I have personally reviewed and evaluated these images and lab results as part of my medical decision-making.   EKG Interpretation None      MDM   Final diagnoses:  Tremor    74 year old female past medical history of end-stage renal disease presents in the setting of tremors. Per patient and family this reportedly began approximately one week ago. She reports tremors are present and upper extremities worse significantly when she tries to do things like drink from a cup. She denies history of this before in the past. Patient reports going to dialysis as scheduled. She denies any headaches, vision change, numbness or tingling in upper or lower extremities, confusion, back pain, neck pain, history of family members with similar tremor. Patient hemodynamically stable on arrival.  On examination tremor not present when patient resting. Patient reports sensation intact in upper and lower extremity bilaterally. Cranial nerves intact on examination. Patient has normal finger to nose and heel to shin examination. The patient asked to extend arms and leave them out she begins to have generalized tremor in bilateral upper extremities. Tremor improves with motion. Patient does not have pill-rolling motion at rest. Laboratory  analysis did not reveal significant electrolyte abnormality. Patient has slight elevation in white blood cell count in setting of recent infection this is not significantly worsened. In setting of patient's presentation this could be essential tremor versus Parkinson's disease versus other tremor. Do not believe the patient requires inpatient admit admission or other labs or imaging at this time. No signs of acute CVA at this time. Patient will require close follow-up with neurology for further management. Patient  given contact information for local neurologist and advised to contact them in the morning for close follow-up. Patient and family given strict return precautions. Patient and family in agreement with plan.  Attending is seen and evaluated patient and Dr. Kathrynn Humble is in agreement with plan.    Esaw Grandchild, MD 06/13/15 Dyann Kief  Varney Biles, MD 06/13/15 2114

## 2015-06-12 NOTE — ED Notes (Signed)
Pt ambulated in hall with assist x1. Pt has shaky gait. Pt able to ambulate, but states she felt weak.

## 2015-06-12 NOTE — ED Notes (Signed)
Provided pt something to eat. Shaking has subsided while eating.

## 2015-06-12 NOTE — ED Notes (Signed)
Pt reports to the ED for eval of extremity shaking since last Monday. Pt was recently admitted for PNA. She is also a dialysis patient and had her full tx yesterday. Pt denies any trouble walking, talking, or seeing. Grips equal, smile symmetric, no arm drift. Pt A&OX4, resp e/u, and skin warm and dry.

## 2015-06-12 NOTE — Discharge Instructions (Signed)
Tremor °A tremor is trembling or shaking that you cannot control. Most tremors affect the hands or arms. Tremors can also affect the head, vocal cords, face, and other parts of the body. There are many types of tremors. Common types include:  °· Essential tremor. These usually occur in people over the age of 40. It may run in families and can happen in otherwise healthy people.   °· Resting tremor. These occur when the muscles are at rest, such as when your hands are resting in your lap. People with Parkinson disease often have resting tremors.   °· Postural tremor. These occur when you try to hold a pose, such as keeping your hands outstretched.   °· Kinetic tremor. These occur during purposeful movement, such as trying to touch a finger to your nose.   °· Task-specific tremor. These may occur when you perform tasks such as handwriting, speaking, or standing.   °· Psychogenic tremor. These dramatically lessen or disappear when you are distracted. They can happen in people of all ages.   °Some types of tremors have no known cause. Tremors can also be a symptom of nervous system problems (neurological disorders) that may occur with aging. Some tremors go away with treatment while others do not.  °HOME CARE INSTRUCTIONS °Watch your tremor for any changes. The following actions may help to lessen any discomfort you are feeling: °· Take medicines only as directed by your health care provider.   °· Limit alcohol intake to no more than 1 drink per day for nonpregnant women and 2 drinks per day for men. One drink equals 12 oz of beer, 5 oz of wine, or 1½ oz of hard liquor. °· Do not use any tobacco products, including cigarettes, chewing tobacco, or electronic cigarettes. If you need help quitting, ask your health care provider.   °· Avoid extreme heat or cold.    °· Limit the amount of caffeine you consume as directed by your health care provider.   °· Try to get 8 hours of sleep each night. °· Find ways to manage your  stress, such as meditation or yoga. °· Keep all follow-up visits as directed by your health care provider. This is important. °SEEK MEDICAL CARE IF: °· You start having a tremor after starting a new medicine. °· You have tremor with other symptoms such as: °¨ Numbness. °¨ Tingling. °¨ Pain. °¨ Weakness. °· Your tremor gets worse. °· Your tremor interferes with your day-to-day life. °  °This information is not intended to replace advice given to you by your health care provider. Make sure you discuss any questions you have with your health care provider. °  °Document Released: 05/23/2002 Document Revised: 06/23/2014 Document Reviewed: 11/28/2013 °Elsevier Interactive Patient Education ©2016 [employer] ° °

## 2015-06-13 DIAGNOSIS — E1122 Type 2 diabetes mellitus with diabetic chronic kidney disease: Secondary | ICD-10-CM | POA: Diagnosis not present

## 2015-06-13 DIAGNOSIS — I120 Hypertensive chronic kidney disease with stage 5 chronic kidney disease or end stage renal disease: Secondary | ICD-10-CM | POA: Diagnosis not present

## 2015-06-13 DIAGNOSIS — N2581 Secondary hyperparathyroidism of renal origin: Secondary | ICD-10-CM | POA: Diagnosis not present

## 2015-06-13 DIAGNOSIS — D509 Iron deficiency anemia, unspecified: Secondary | ICD-10-CM | POA: Diagnosis not present

## 2015-06-13 DIAGNOSIS — E875 Hyperkalemia: Secondary | ICD-10-CM | POA: Diagnosis not present

## 2015-06-13 DIAGNOSIS — Z992 Dependence on renal dialysis: Secondary | ICD-10-CM | POA: Diagnosis not present

## 2015-06-13 DIAGNOSIS — N186 End stage renal disease: Secondary | ICD-10-CM | POA: Diagnosis not present

## 2015-06-13 DIAGNOSIS — J189 Pneumonia, unspecified organism: Secondary | ICD-10-CM | POA: Diagnosis not present

## 2015-06-13 DIAGNOSIS — D631 Anemia in chronic kidney disease: Secondary | ICD-10-CM | POA: Diagnosis not present

## 2015-06-13 DIAGNOSIS — Z794 Long term (current) use of insulin: Secondary | ICD-10-CM | POA: Diagnosis not present

## 2015-06-14 DIAGNOSIS — Z992 Dependence on renal dialysis: Secondary | ICD-10-CM | POA: Diagnosis not present

## 2015-06-14 DIAGNOSIS — D510 Vitamin B12 deficiency anemia due to intrinsic factor deficiency: Secondary | ICD-10-CM | POA: Diagnosis not present

## 2015-06-14 DIAGNOSIS — E1122 Type 2 diabetes mellitus with diabetic chronic kidney disease: Secondary | ICD-10-CM | POA: Diagnosis not present

## 2015-06-14 DIAGNOSIS — N186 End stage renal disease: Secondary | ICD-10-CM | POA: Diagnosis not present

## 2015-06-14 DIAGNOSIS — E875 Hyperkalemia: Secondary | ICD-10-CM | POA: Diagnosis not present

## 2015-06-14 DIAGNOSIS — I120 Hypertensive chronic kidney disease with stage 5 chronic kidney disease or end stage renal disease: Secondary | ICD-10-CM | POA: Diagnosis not present

## 2015-06-14 DIAGNOSIS — Z794 Long term (current) use of insulin: Secondary | ICD-10-CM | POA: Diagnosis not present

## 2015-06-14 DIAGNOSIS — J189 Pneumonia, unspecified organism: Secondary | ICD-10-CM | POA: Diagnosis not present

## 2015-06-15 DIAGNOSIS — D631 Anemia in chronic kidney disease: Secondary | ICD-10-CM | POA: Diagnosis not present

## 2015-06-15 DIAGNOSIS — D509 Iron deficiency anemia, unspecified: Secondary | ICD-10-CM | POA: Diagnosis not present

## 2015-06-15 DIAGNOSIS — Z992 Dependence on renal dialysis: Secondary | ICD-10-CM | POA: Diagnosis not present

## 2015-06-15 DIAGNOSIS — N2581 Secondary hyperparathyroidism of renal origin: Secondary | ICD-10-CM | POA: Diagnosis not present

## 2015-06-15 DIAGNOSIS — N186 End stage renal disease: Secondary | ICD-10-CM | POA: Diagnosis not present

## 2015-06-16 DIAGNOSIS — Z992 Dependence on renal dialysis: Secondary | ICD-10-CM | POA: Diagnosis not present

## 2015-06-16 DIAGNOSIS — N186 End stage renal disease: Secondary | ICD-10-CM | POA: Diagnosis not present

## 2015-06-18 DIAGNOSIS — Z992 Dependence on renal dialysis: Secondary | ICD-10-CM | POA: Diagnosis not present

## 2015-06-18 DIAGNOSIS — N186 End stage renal disease: Secondary | ICD-10-CM | POA: Diagnosis not present

## 2015-06-19 ENCOUNTER — Ambulatory Visit (INDEPENDENT_AMBULATORY_CARE_PROVIDER_SITE_OTHER): Payer: Medicare Other | Admitting: *Deleted

## 2015-06-19 DIAGNOSIS — I6340 Cerebral infarction due to embolism of unspecified cerebral artery: Secondary | ICD-10-CM

## 2015-06-19 DIAGNOSIS — Z794 Long term (current) use of insulin: Secondary | ICD-10-CM | POA: Diagnosis not present

## 2015-06-19 DIAGNOSIS — E1122 Type 2 diabetes mellitus with diabetic chronic kidney disease: Secondary | ICD-10-CM | POA: Diagnosis not present

## 2015-06-19 DIAGNOSIS — Z992 Dependence on renal dialysis: Secondary | ICD-10-CM | POA: Diagnosis not present

## 2015-06-19 DIAGNOSIS — I120 Hypertensive chronic kidney disease with stage 5 chronic kidney disease or end stage renal disease: Secondary | ICD-10-CM | POA: Diagnosis not present

## 2015-06-19 DIAGNOSIS — E875 Hyperkalemia: Secondary | ICD-10-CM | POA: Diagnosis not present

## 2015-06-19 DIAGNOSIS — J189 Pneumonia, unspecified organism: Secondary | ICD-10-CM | POA: Diagnosis not present

## 2015-06-19 DIAGNOSIS — N186 End stage renal disease: Secondary | ICD-10-CM | POA: Diagnosis not present

## 2015-06-20 DIAGNOSIS — N186 End stage renal disease: Secondary | ICD-10-CM | POA: Diagnosis not present

## 2015-06-20 DIAGNOSIS — Z992 Dependence on renal dialysis: Secondary | ICD-10-CM | POA: Diagnosis not present

## 2015-06-20 NOTE — Progress Notes (Signed)
Carelink Summary Report / Loop Recorder 

## 2015-06-21 DIAGNOSIS — N186 End stage renal disease: Secondary | ICD-10-CM | POA: Diagnosis not present

## 2015-06-21 DIAGNOSIS — E1122 Type 2 diabetes mellitus with diabetic chronic kidney disease: Secondary | ICD-10-CM | POA: Diagnosis not present

## 2015-06-21 DIAGNOSIS — J189 Pneumonia, unspecified organism: Secondary | ICD-10-CM | POA: Diagnosis not present

## 2015-06-21 DIAGNOSIS — Z992 Dependence on renal dialysis: Secondary | ICD-10-CM | POA: Diagnosis not present

## 2015-06-21 DIAGNOSIS — E875 Hyperkalemia: Secondary | ICD-10-CM | POA: Diagnosis not present

## 2015-06-21 DIAGNOSIS — Z794 Long term (current) use of insulin: Secondary | ICD-10-CM | POA: Diagnosis not present

## 2015-06-21 DIAGNOSIS — I120 Hypertensive chronic kidney disease with stage 5 chronic kidney disease or end stage renal disease: Secondary | ICD-10-CM | POA: Diagnosis not present

## 2015-06-22 DIAGNOSIS — N186 End stage renal disease: Secondary | ICD-10-CM | POA: Diagnosis not present

## 2015-06-22 DIAGNOSIS — Z992 Dependence on renal dialysis: Secondary | ICD-10-CM | POA: Diagnosis not present

## 2015-06-25 DIAGNOSIS — Z794 Long term (current) use of insulin: Secondary | ICD-10-CM | POA: Diagnosis not present

## 2015-06-25 DIAGNOSIS — Z992 Dependence on renal dialysis: Secondary | ICD-10-CM | POA: Diagnosis not present

## 2015-06-25 DIAGNOSIS — E119 Type 2 diabetes mellitus without complications: Secondary | ICD-10-CM | POA: Diagnosis not present

## 2015-06-25 DIAGNOSIS — N186 End stage renal disease: Secondary | ICD-10-CM | POA: Diagnosis not present

## 2015-06-26 ENCOUNTER — Ambulatory Visit: Payer: Self-pay | Admitting: Neurology

## 2015-06-26 DIAGNOSIS — E875 Hyperkalemia: Secondary | ICD-10-CM | POA: Diagnosis not present

## 2015-06-26 DIAGNOSIS — N186 End stage renal disease: Secondary | ICD-10-CM | POA: Diagnosis not present

## 2015-06-26 DIAGNOSIS — Z794 Long term (current) use of insulin: Secondary | ICD-10-CM | POA: Diagnosis not present

## 2015-06-26 DIAGNOSIS — I120 Hypertensive chronic kidney disease with stage 5 chronic kidney disease or end stage renal disease: Secondary | ICD-10-CM | POA: Diagnosis not present

## 2015-06-26 DIAGNOSIS — Z992 Dependence on renal dialysis: Secondary | ICD-10-CM | POA: Diagnosis not present

## 2015-06-26 DIAGNOSIS — J189 Pneumonia, unspecified organism: Secondary | ICD-10-CM | POA: Diagnosis not present

## 2015-06-26 DIAGNOSIS — E1122 Type 2 diabetes mellitus with diabetic chronic kidney disease: Secondary | ICD-10-CM | POA: Diagnosis not present

## 2015-06-27 DIAGNOSIS — Z992 Dependence on renal dialysis: Secondary | ICD-10-CM | POA: Diagnosis not present

## 2015-06-27 DIAGNOSIS — N186 End stage renal disease: Secondary | ICD-10-CM | POA: Diagnosis not present

## 2015-06-28 DIAGNOSIS — N186 End stage renal disease: Secondary | ICD-10-CM | POA: Diagnosis not present

## 2015-06-28 DIAGNOSIS — E875 Hyperkalemia: Secondary | ICD-10-CM | POA: Diagnosis not present

## 2015-06-28 DIAGNOSIS — Z992 Dependence on renal dialysis: Secondary | ICD-10-CM | POA: Diagnosis not present

## 2015-06-28 DIAGNOSIS — E1122 Type 2 diabetes mellitus with diabetic chronic kidney disease: Secondary | ICD-10-CM | POA: Diagnosis not present

## 2015-06-28 DIAGNOSIS — J189 Pneumonia, unspecified organism: Secondary | ICD-10-CM | POA: Diagnosis not present

## 2015-06-28 DIAGNOSIS — Z794 Long term (current) use of insulin: Secondary | ICD-10-CM | POA: Diagnosis not present

## 2015-06-28 DIAGNOSIS — I120 Hypertensive chronic kidney disease with stage 5 chronic kidney disease or end stage renal disease: Secondary | ICD-10-CM | POA: Diagnosis not present

## 2015-06-29 DIAGNOSIS — N186 End stage renal disease: Secondary | ICD-10-CM | POA: Diagnosis not present

## 2015-06-29 DIAGNOSIS — Z992 Dependence on renal dialysis: Secondary | ICD-10-CM | POA: Diagnosis not present

## 2015-07-02 DIAGNOSIS — Z992 Dependence on renal dialysis: Secondary | ICD-10-CM | POA: Diagnosis not present

## 2015-07-02 DIAGNOSIS — N186 End stage renal disease: Secondary | ICD-10-CM | POA: Diagnosis not present

## 2015-07-03 ENCOUNTER — Ambulatory Visit: Payer: Self-pay | Admitting: Neurology

## 2015-07-03 DIAGNOSIS — I120 Hypertensive chronic kidney disease with stage 5 chronic kidney disease or end stage renal disease: Secondary | ICD-10-CM | POA: Diagnosis not present

## 2015-07-03 DIAGNOSIS — N186 End stage renal disease: Secondary | ICD-10-CM | POA: Diagnosis not present

## 2015-07-03 DIAGNOSIS — Z794 Long term (current) use of insulin: Secondary | ICD-10-CM | POA: Diagnosis not present

## 2015-07-03 DIAGNOSIS — J189 Pneumonia, unspecified organism: Secondary | ICD-10-CM | POA: Diagnosis not present

## 2015-07-03 DIAGNOSIS — Z992 Dependence on renal dialysis: Secondary | ICD-10-CM | POA: Diagnosis not present

## 2015-07-03 DIAGNOSIS — E875 Hyperkalemia: Secondary | ICD-10-CM | POA: Diagnosis not present

## 2015-07-03 DIAGNOSIS — E1122 Type 2 diabetes mellitus with diabetic chronic kidney disease: Secondary | ICD-10-CM | POA: Diagnosis not present

## 2015-07-04 DIAGNOSIS — N186 End stage renal disease: Secondary | ICD-10-CM | POA: Diagnosis not present

## 2015-07-04 DIAGNOSIS — Z992 Dependence on renal dialysis: Secondary | ICD-10-CM | POA: Diagnosis not present

## 2015-07-06 DIAGNOSIS — N186 End stage renal disease: Secondary | ICD-10-CM | POA: Diagnosis not present

## 2015-07-06 DIAGNOSIS — Z992 Dependence on renal dialysis: Secondary | ICD-10-CM | POA: Diagnosis not present

## 2015-07-09 DIAGNOSIS — Z992 Dependence on renal dialysis: Secondary | ICD-10-CM | POA: Diagnosis not present

## 2015-07-09 DIAGNOSIS — N186 End stage renal disease: Secondary | ICD-10-CM | POA: Diagnosis not present

## 2015-07-11 DIAGNOSIS — N186 End stage renal disease: Secondary | ICD-10-CM | POA: Diagnosis not present

## 2015-07-11 DIAGNOSIS — Z992 Dependence on renal dialysis: Secondary | ICD-10-CM | POA: Diagnosis not present

## 2015-07-13 DIAGNOSIS — Z992 Dependence on renal dialysis: Secondary | ICD-10-CM | POA: Diagnosis not present

## 2015-07-13 DIAGNOSIS — N186 End stage renal disease: Secondary | ICD-10-CM | POA: Diagnosis not present

## 2015-07-16 DIAGNOSIS — Z992 Dependence on renal dialysis: Secondary | ICD-10-CM | POA: Diagnosis not present

## 2015-07-16 DIAGNOSIS — N186 End stage renal disease: Secondary | ICD-10-CM | POA: Diagnosis not present

## 2015-07-17 DIAGNOSIS — N186 End stage renal disease: Secondary | ICD-10-CM | POA: Diagnosis not present

## 2015-07-17 DIAGNOSIS — D510 Vitamin B12 deficiency anemia due to intrinsic factor deficiency: Secondary | ICD-10-CM | POA: Diagnosis not present

## 2015-07-17 DIAGNOSIS — E1122 Type 2 diabetes mellitus with diabetic chronic kidney disease: Secondary | ICD-10-CM | POA: Diagnosis not present

## 2015-07-17 DIAGNOSIS — I10 Essential (primary) hypertension: Secondary | ICD-10-CM | POA: Diagnosis not present

## 2015-07-18 DIAGNOSIS — N186 End stage renal disease: Secondary | ICD-10-CM | POA: Diagnosis not present

## 2015-07-18 DIAGNOSIS — Z992 Dependence on renal dialysis: Secondary | ICD-10-CM | POA: Diagnosis not present

## 2015-07-19 ENCOUNTER — Ambulatory Visit (INDEPENDENT_AMBULATORY_CARE_PROVIDER_SITE_OTHER): Payer: Medicare Other | Admitting: *Deleted

## 2015-07-19 DIAGNOSIS — I6340 Cerebral infarction due to embolism of unspecified cerebral artery: Secondary | ICD-10-CM

## 2015-07-20 DIAGNOSIS — N186 End stage renal disease: Secondary | ICD-10-CM | POA: Diagnosis not present

## 2015-07-20 DIAGNOSIS — Z992 Dependence on renal dialysis: Secondary | ICD-10-CM | POA: Diagnosis not present

## 2015-07-23 ENCOUNTER — Encounter: Payer: Self-pay | Admitting: Cardiology

## 2015-07-23 DIAGNOSIS — N186 End stage renal disease: Secondary | ICD-10-CM | POA: Diagnosis not present

## 2015-07-23 DIAGNOSIS — Z992 Dependence on renal dialysis: Secondary | ICD-10-CM | POA: Diagnosis not present

## 2015-07-23 NOTE — Progress Notes (Signed)
Carelink Summary Report / Loop Recorder 

## 2015-07-25 DIAGNOSIS — N186 End stage renal disease: Secondary | ICD-10-CM | POA: Diagnosis not present

## 2015-07-25 DIAGNOSIS — Z992 Dependence on renal dialysis: Secondary | ICD-10-CM | POA: Diagnosis not present

## 2015-07-27 DIAGNOSIS — N186 End stage renal disease: Secondary | ICD-10-CM | POA: Diagnosis not present

## 2015-07-27 DIAGNOSIS — Z992 Dependence on renal dialysis: Secondary | ICD-10-CM | POA: Diagnosis not present

## 2015-07-30 DIAGNOSIS — N186 End stage renal disease: Secondary | ICD-10-CM | POA: Diagnosis not present

## 2015-07-30 DIAGNOSIS — Z992 Dependence on renal dialysis: Secondary | ICD-10-CM | POA: Diagnosis not present

## 2015-08-01 DIAGNOSIS — N186 End stage renal disease: Secondary | ICD-10-CM | POA: Diagnosis not present

## 2015-08-01 DIAGNOSIS — Z992 Dependence on renal dialysis: Secondary | ICD-10-CM | POA: Diagnosis not present

## 2015-08-02 LAB — CUP PACEART REMOTE DEVICE CHECK: MDC IDC SESS DTM: 20170104030648

## 2015-08-02 NOTE — Progress Notes (Signed)
Carelink summary report received. Battery status OK. Normal device function. No new symptom episodes, tachy episodes, brady, or pause episodes. No new AF episodes. Monthly summary reports and ROV/PRN 

## 2015-08-03 DIAGNOSIS — N186 End stage renal disease: Secondary | ICD-10-CM | POA: Diagnosis not present

## 2015-08-03 DIAGNOSIS — Z992 Dependence on renal dialysis: Secondary | ICD-10-CM | POA: Diagnosis not present

## 2015-08-06 DIAGNOSIS — Z992 Dependence on renal dialysis: Secondary | ICD-10-CM | POA: Diagnosis not present

## 2015-08-06 DIAGNOSIS — N186 End stage renal disease: Secondary | ICD-10-CM | POA: Diagnosis not present

## 2015-08-08 DIAGNOSIS — N186 End stage renal disease: Secondary | ICD-10-CM | POA: Diagnosis not present

## 2015-08-08 DIAGNOSIS — Z992 Dependence on renal dialysis: Secondary | ICD-10-CM | POA: Diagnosis not present

## 2015-08-10 DIAGNOSIS — Z992 Dependence on renal dialysis: Secondary | ICD-10-CM | POA: Diagnosis not present

## 2015-08-10 DIAGNOSIS — N186 End stage renal disease: Secondary | ICD-10-CM | POA: Diagnosis not present

## 2015-08-13 DIAGNOSIS — N186 End stage renal disease: Secondary | ICD-10-CM | POA: Diagnosis not present

## 2015-08-13 DIAGNOSIS — Z992 Dependence on renal dialysis: Secondary | ICD-10-CM | POA: Diagnosis not present

## 2015-08-14 DIAGNOSIS — N186 End stage renal disease: Secondary | ICD-10-CM | POA: Diagnosis not present

## 2015-08-14 DIAGNOSIS — D510 Vitamin B12 deficiency anemia due to intrinsic factor deficiency: Secondary | ICD-10-CM | POA: Diagnosis not present

## 2015-08-14 DIAGNOSIS — Z992 Dependence on renal dialysis: Secondary | ICD-10-CM | POA: Diagnosis not present

## 2015-08-15 DIAGNOSIS — N186 End stage renal disease: Secondary | ICD-10-CM | POA: Diagnosis not present

## 2015-08-15 DIAGNOSIS — Z992 Dependence on renal dialysis: Secondary | ICD-10-CM | POA: Diagnosis not present

## 2015-08-17 DIAGNOSIS — Z992 Dependence on renal dialysis: Secondary | ICD-10-CM | POA: Diagnosis not present

## 2015-08-17 DIAGNOSIS — N186 End stage renal disease: Secondary | ICD-10-CM | POA: Diagnosis not present

## 2015-08-20 ENCOUNTER — Ambulatory Visit (INDEPENDENT_AMBULATORY_CARE_PROVIDER_SITE_OTHER): Payer: Medicare Other | Admitting: *Deleted

## 2015-08-20 DIAGNOSIS — Z992 Dependence on renal dialysis: Secondary | ICD-10-CM | POA: Diagnosis not present

## 2015-08-20 DIAGNOSIS — N186 End stage renal disease: Secondary | ICD-10-CM | POA: Diagnosis not present

## 2015-08-20 DIAGNOSIS — I6340 Cerebral infarction due to embolism of unspecified cerebral artery: Secondary | ICD-10-CM

## 2015-08-21 LAB — CUP PACEART REMOTE DEVICE CHECK: Date Time Interrogation Session: 20170203030725

## 2015-08-21 NOTE — Progress Notes (Signed)
Carelink summary report received. Battery status OK. Normal device function. No new symptom episodes, tachy episodes, brady, or pause episodes. No new AF episodes. Monthly summary reports and ROV/PRN 

## 2015-08-22 DIAGNOSIS — Z992 Dependence on renal dialysis: Secondary | ICD-10-CM | POA: Diagnosis not present

## 2015-08-22 DIAGNOSIS — N186 End stage renal disease: Secondary | ICD-10-CM | POA: Diagnosis not present

## 2015-08-24 DIAGNOSIS — N186 End stage renal disease: Secondary | ICD-10-CM | POA: Diagnosis not present

## 2015-08-24 DIAGNOSIS — Z992 Dependence on renal dialysis: Secondary | ICD-10-CM | POA: Diagnosis not present

## 2015-08-24 NOTE — Progress Notes (Signed)
Carelink Summary Report / Loop Recorder 

## 2015-08-25 LAB — CUP PACEART REMOTE DEVICE CHECK: Date Time Interrogation Session: 20170305033639

## 2015-08-25 NOTE — Progress Notes (Signed)
Carelink summary report received. Battery status OK. Normal device function. No new symptom episodes, tachy episodes, brady, or pause episodes. No new AF episodes. Monthly summary reports and ROV/PRN 

## 2015-08-27 DIAGNOSIS — N186 End stage renal disease: Secondary | ICD-10-CM | POA: Diagnosis not present

## 2015-08-27 DIAGNOSIS — Z992 Dependence on renal dialysis: Secondary | ICD-10-CM | POA: Diagnosis not present

## 2015-08-29 DIAGNOSIS — N186 End stage renal disease: Secondary | ICD-10-CM | POA: Diagnosis not present

## 2015-08-29 DIAGNOSIS — Z992 Dependence on renal dialysis: Secondary | ICD-10-CM | POA: Diagnosis not present

## 2015-08-31 DIAGNOSIS — N186 End stage renal disease: Secondary | ICD-10-CM | POA: Diagnosis not present

## 2015-08-31 DIAGNOSIS — Z992 Dependence on renal dialysis: Secondary | ICD-10-CM | POA: Diagnosis not present

## 2015-09-03 DIAGNOSIS — N186 End stage renal disease: Secondary | ICD-10-CM | POA: Diagnosis not present

## 2015-09-03 DIAGNOSIS — Z992 Dependence on renal dialysis: Secondary | ICD-10-CM | POA: Diagnosis not present

## 2015-09-05 DIAGNOSIS — N186 End stage renal disease: Secondary | ICD-10-CM | POA: Diagnosis not present

## 2015-09-05 DIAGNOSIS — Z992 Dependence on renal dialysis: Secondary | ICD-10-CM | POA: Diagnosis not present

## 2015-09-07 DIAGNOSIS — Z992 Dependence on renal dialysis: Secondary | ICD-10-CM | POA: Diagnosis not present

## 2015-09-07 DIAGNOSIS — N186 End stage renal disease: Secondary | ICD-10-CM | POA: Diagnosis not present

## 2015-09-10 DIAGNOSIS — Z992 Dependence on renal dialysis: Secondary | ICD-10-CM | POA: Diagnosis not present

## 2015-09-10 DIAGNOSIS — N186 End stage renal disease: Secondary | ICD-10-CM | POA: Diagnosis not present

## 2015-09-12 DIAGNOSIS — Z992 Dependence on renal dialysis: Secondary | ICD-10-CM | POA: Diagnosis not present

## 2015-09-12 DIAGNOSIS — N186 End stage renal disease: Secondary | ICD-10-CM | POA: Diagnosis not present

## 2015-09-14 DIAGNOSIS — N186 End stage renal disease: Secondary | ICD-10-CM | POA: Diagnosis not present

## 2015-09-14 DIAGNOSIS — Z992 Dependence on renal dialysis: Secondary | ICD-10-CM | POA: Diagnosis not present

## 2015-09-14 DIAGNOSIS — D510 Vitamin B12 deficiency anemia due to intrinsic factor deficiency: Secondary | ICD-10-CM | POA: Diagnosis not present

## 2015-09-17 ENCOUNTER — Ambulatory Visit (INDEPENDENT_AMBULATORY_CARE_PROVIDER_SITE_OTHER): Payer: Medicare Other | Admitting: *Deleted

## 2015-09-17 DIAGNOSIS — I6340 Cerebral infarction due to embolism of unspecified cerebral artery: Secondary | ICD-10-CM

## 2015-09-17 DIAGNOSIS — Z992 Dependence on renal dialysis: Secondary | ICD-10-CM | POA: Diagnosis not present

## 2015-09-17 DIAGNOSIS — N186 End stage renal disease: Secondary | ICD-10-CM | POA: Diagnosis not present

## 2015-09-18 DIAGNOSIS — L603 Nail dystrophy: Secondary | ICD-10-CM | POA: Diagnosis not present

## 2015-09-18 DIAGNOSIS — L851 Acquired keratosis [keratoderma] palmaris et plantaris: Secondary | ICD-10-CM | POA: Diagnosis not present

## 2015-09-18 DIAGNOSIS — E1142 Type 2 diabetes mellitus with diabetic polyneuropathy: Secondary | ICD-10-CM | POA: Diagnosis not present

## 2015-09-18 NOTE — Progress Notes (Signed)
Carelink Summary Report / Loop Recorder 

## 2015-09-19 DIAGNOSIS — N186 End stage renal disease: Secondary | ICD-10-CM | POA: Diagnosis not present

## 2015-09-19 DIAGNOSIS — Z992 Dependence on renal dialysis: Secondary | ICD-10-CM | POA: Diagnosis not present

## 2015-09-21 DIAGNOSIS — Z992 Dependence on renal dialysis: Secondary | ICD-10-CM | POA: Diagnosis not present

## 2015-09-21 DIAGNOSIS — N186 End stage renal disease: Secondary | ICD-10-CM | POA: Diagnosis not present

## 2015-09-24 DIAGNOSIS — E119 Type 2 diabetes mellitus without complications: Secondary | ICD-10-CM | POA: Diagnosis not present

## 2015-09-24 DIAGNOSIS — Z992 Dependence on renal dialysis: Secondary | ICD-10-CM | POA: Diagnosis not present

## 2015-09-24 DIAGNOSIS — Z794 Long term (current) use of insulin: Secondary | ICD-10-CM | POA: Diagnosis not present

## 2015-09-24 DIAGNOSIS — N186 End stage renal disease: Secondary | ICD-10-CM | POA: Diagnosis not present

## 2015-09-26 DIAGNOSIS — Z992 Dependence on renal dialysis: Secondary | ICD-10-CM | POA: Diagnosis not present

## 2015-09-26 DIAGNOSIS — N186 End stage renal disease: Secondary | ICD-10-CM | POA: Diagnosis not present

## 2015-09-28 DIAGNOSIS — N186 End stage renal disease: Secondary | ICD-10-CM | POA: Diagnosis not present

## 2015-09-28 DIAGNOSIS — Z992 Dependence on renal dialysis: Secondary | ICD-10-CM | POA: Diagnosis not present

## 2015-10-01 DIAGNOSIS — N186 End stage renal disease: Secondary | ICD-10-CM | POA: Diagnosis not present

## 2015-10-01 DIAGNOSIS — Z992 Dependence on renal dialysis: Secondary | ICD-10-CM | POA: Diagnosis not present

## 2015-10-03 DIAGNOSIS — Z992 Dependence on renal dialysis: Secondary | ICD-10-CM | POA: Diagnosis not present

## 2015-10-03 DIAGNOSIS — N186 End stage renal disease: Secondary | ICD-10-CM | POA: Diagnosis not present

## 2015-10-05 DIAGNOSIS — N186 End stage renal disease: Secondary | ICD-10-CM | POA: Diagnosis not present

## 2015-10-05 DIAGNOSIS — Z992 Dependence on renal dialysis: Secondary | ICD-10-CM | POA: Diagnosis not present

## 2015-10-08 DIAGNOSIS — Z992 Dependence on renal dialysis: Secondary | ICD-10-CM | POA: Diagnosis not present

## 2015-10-08 DIAGNOSIS — N186 End stage renal disease: Secondary | ICD-10-CM | POA: Diagnosis not present

## 2015-10-10 DIAGNOSIS — N186 End stage renal disease: Secondary | ICD-10-CM | POA: Diagnosis not present

## 2015-10-10 DIAGNOSIS — Z992 Dependence on renal dialysis: Secondary | ICD-10-CM | POA: Diagnosis not present

## 2015-10-12 DIAGNOSIS — Z992 Dependence on renal dialysis: Secondary | ICD-10-CM | POA: Diagnosis not present

## 2015-10-12 DIAGNOSIS — N186 End stage renal disease: Secondary | ICD-10-CM | POA: Diagnosis not present

## 2015-10-14 DIAGNOSIS — N186 End stage renal disease: Secondary | ICD-10-CM | POA: Diagnosis not present

## 2015-10-14 DIAGNOSIS — Z992 Dependence on renal dialysis: Secondary | ICD-10-CM | POA: Diagnosis not present

## 2015-10-15 DIAGNOSIS — N186 End stage renal disease: Secondary | ICD-10-CM | POA: Diagnosis not present

## 2015-10-15 DIAGNOSIS — Z992 Dependence on renal dialysis: Secondary | ICD-10-CM | POA: Diagnosis not present

## 2015-10-16 DIAGNOSIS — D510 Vitamin B12 deficiency anemia due to intrinsic factor deficiency: Secondary | ICD-10-CM | POA: Diagnosis not present

## 2015-10-16 DIAGNOSIS — N186 End stage renal disease: Secondary | ICD-10-CM | POA: Diagnosis not present

## 2015-10-16 DIAGNOSIS — Z0000 Encounter for general adult medical examination without abnormal findings: Secondary | ICD-10-CM | POA: Diagnosis not present

## 2015-10-16 DIAGNOSIS — E1122 Type 2 diabetes mellitus with diabetic chronic kidney disease: Secondary | ICD-10-CM | POA: Diagnosis not present

## 2015-10-17 DIAGNOSIS — Z992 Dependence on renal dialysis: Secondary | ICD-10-CM | POA: Diagnosis not present

## 2015-10-17 DIAGNOSIS — N186 End stage renal disease: Secondary | ICD-10-CM | POA: Diagnosis not present

## 2015-10-18 ENCOUNTER — Ambulatory Visit (INDEPENDENT_AMBULATORY_CARE_PROVIDER_SITE_OTHER): Payer: Medicare Other | Admitting: *Deleted

## 2015-10-18 DIAGNOSIS — I6340 Cerebral infarction due to embolism of unspecified cerebral artery: Secondary | ICD-10-CM

## 2015-10-18 NOTE — Progress Notes (Signed)
Carelink Summary Report / Loop Recorder 

## 2015-10-19 DIAGNOSIS — N186 End stage renal disease: Secondary | ICD-10-CM | POA: Diagnosis not present

## 2015-10-19 DIAGNOSIS — Z992 Dependence on renal dialysis: Secondary | ICD-10-CM | POA: Diagnosis not present

## 2015-10-22 DIAGNOSIS — Z992 Dependence on renal dialysis: Secondary | ICD-10-CM | POA: Diagnosis not present

## 2015-10-22 DIAGNOSIS — N186 End stage renal disease: Secondary | ICD-10-CM | POA: Diagnosis not present

## 2015-10-24 DIAGNOSIS — Z992 Dependence on renal dialysis: Secondary | ICD-10-CM | POA: Diagnosis not present

## 2015-10-24 DIAGNOSIS — N186 End stage renal disease: Secondary | ICD-10-CM | POA: Diagnosis not present

## 2015-10-26 DIAGNOSIS — Z992 Dependence on renal dialysis: Secondary | ICD-10-CM | POA: Diagnosis not present

## 2015-10-26 DIAGNOSIS — N186 End stage renal disease: Secondary | ICD-10-CM | POA: Diagnosis not present

## 2015-10-29 DIAGNOSIS — N186 End stage renal disease: Secondary | ICD-10-CM | POA: Diagnosis not present

## 2015-10-29 DIAGNOSIS — Z992 Dependence on renal dialysis: Secondary | ICD-10-CM | POA: Diagnosis not present

## 2015-10-31 DIAGNOSIS — N186 End stage renal disease: Secondary | ICD-10-CM | POA: Diagnosis not present

## 2015-10-31 DIAGNOSIS — Z992 Dependence on renal dialysis: Secondary | ICD-10-CM | POA: Diagnosis not present

## 2015-11-02 DIAGNOSIS — N186 End stage renal disease: Secondary | ICD-10-CM | POA: Diagnosis not present

## 2015-11-02 DIAGNOSIS — Z992 Dependence on renal dialysis: Secondary | ICD-10-CM | POA: Diagnosis not present

## 2015-11-05 DIAGNOSIS — Z992 Dependence on renal dialysis: Secondary | ICD-10-CM | POA: Diagnosis not present

## 2015-11-05 DIAGNOSIS — N186 End stage renal disease: Secondary | ICD-10-CM | POA: Diagnosis not present

## 2015-11-07 DIAGNOSIS — N186 End stage renal disease: Secondary | ICD-10-CM | POA: Diagnosis not present

## 2015-11-07 DIAGNOSIS — Z992 Dependence on renal dialysis: Secondary | ICD-10-CM | POA: Diagnosis not present

## 2015-11-09 DIAGNOSIS — Z992 Dependence on renal dialysis: Secondary | ICD-10-CM | POA: Diagnosis not present

## 2015-11-09 DIAGNOSIS — N186 End stage renal disease: Secondary | ICD-10-CM | POA: Diagnosis not present

## 2015-11-11 LAB — CUP PACEART REMOTE DEVICE CHECK: MDC IDC SESS DTM: 20170404040720

## 2015-11-11 NOTE — Progress Notes (Signed)
Carelink summary report received. Battery status OK. Normal device function. No new symptom episodes, tachy episodes, brady, or pause episodes. No new AF episodes. Monthly summary reports and ROV/PRN 

## 2015-11-12 DIAGNOSIS — Z992 Dependence on renal dialysis: Secondary | ICD-10-CM | POA: Diagnosis not present

## 2015-11-12 DIAGNOSIS — N186 End stage renal disease: Secondary | ICD-10-CM | POA: Diagnosis not present

## 2015-11-14 DIAGNOSIS — Z992 Dependence on renal dialysis: Secondary | ICD-10-CM | POA: Diagnosis not present

## 2015-11-14 DIAGNOSIS — N186 End stage renal disease: Secondary | ICD-10-CM | POA: Diagnosis not present

## 2015-11-16 DIAGNOSIS — D510 Vitamin B12 deficiency anemia due to intrinsic factor deficiency: Secondary | ICD-10-CM | POA: Diagnosis not present

## 2015-11-16 DIAGNOSIS — Z992 Dependence on renal dialysis: Secondary | ICD-10-CM | POA: Diagnosis not present

## 2015-11-16 DIAGNOSIS — N186 End stage renal disease: Secondary | ICD-10-CM | POA: Diagnosis not present

## 2015-11-17 LAB — CUP PACEART REMOTE DEVICE CHECK: MDC IDC SESS DTM: 20170603043543

## 2015-11-19 ENCOUNTER — Ambulatory Visit (INDEPENDENT_AMBULATORY_CARE_PROVIDER_SITE_OTHER): Payer: Medicare Other | Admitting: *Deleted

## 2015-11-19 DIAGNOSIS — Z992 Dependence on renal dialysis: Secondary | ICD-10-CM | POA: Diagnosis not present

## 2015-11-19 DIAGNOSIS — I6340 Cerebral infarction due to embolism of unspecified cerebral artery: Secondary | ICD-10-CM | POA: Diagnosis not present

## 2015-11-19 DIAGNOSIS — N186 End stage renal disease: Secondary | ICD-10-CM | POA: Diagnosis not present

## 2015-11-19 NOTE — Progress Notes (Signed)
Carelink Summary Report / Loop Recorder 

## 2015-11-21 DIAGNOSIS — N186 End stage renal disease: Secondary | ICD-10-CM | POA: Diagnosis not present

## 2015-11-21 DIAGNOSIS — Z992 Dependence on renal dialysis: Secondary | ICD-10-CM | POA: Diagnosis not present

## 2015-11-23 DIAGNOSIS — N186 End stage renal disease: Secondary | ICD-10-CM | POA: Diagnosis not present

## 2015-11-23 DIAGNOSIS — Z992 Dependence on renal dialysis: Secondary | ICD-10-CM | POA: Diagnosis not present

## 2015-11-25 LAB — CUP PACEART REMOTE DEVICE CHECK: Date Time Interrogation Session: 20170504043645

## 2015-11-25 NOTE — Progress Notes (Signed)
Carelink summary report received. Battery status OK. Normal device function. No new symptom episodes, tachy episodes, brady, or pause episodes. No new AF episodes. Monthly summary reports and ROV/PRN 

## 2015-11-26 DIAGNOSIS — Z992 Dependence on renal dialysis: Secondary | ICD-10-CM | POA: Diagnosis not present

## 2015-11-26 DIAGNOSIS — N186 End stage renal disease: Secondary | ICD-10-CM | POA: Diagnosis not present

## 2015-11-28 DIAGNOSIS — N186 End stage renal disease: Secondary | ICD-10-CM | POA: Diagnosis not present

## 2015-11-28 DIAGNOSIS — Z992 Dependence on renal dialysis: Secondary | ICD-10-CM | POA: Diagnosis not present

## 2015-11-30 DIAGNOSIS — Z992 Dependence on renal dialysis: Secondary | ICD-10-CM | POA: Diagnosis not present

## 2015-11-30 DIAGNOSIS — N186 End stage renal disease: Secondary | ICD-10-CM | POA: Diagnosis not present

## 2015-12-03 DIAGNOSIS — Z992 Dependence on renal dialysis: Secondary | ICD-10-CM | POA: Diagnosis not present

## 2015-12-03 DIAGNOSIS — N186 End stage renal disease: Secondary | ICD-10-CM | POA: Diagnosis not present

## 2015-12-05 DIAGNOSIS — Z992 Dependence on renal dialysis: Secondary | ICD-10-CM | POA: Diagnosis not present

## 2015-12-05 DIAGNOSIS — N186 End stage renal disease: Secondary | ICD-10-CM | POA: Diagnosis not present

## 2015-12-07 DIAGNOSIS — Z992 Dependence on renal dialysis: Secondary | ICD-10-CM | POA: Diagnosis not present

## 2015-12-07 DIAGNOSIS — N186 End stage renal disease: Secondary | ICD-10-CM | POA: Diagnosis not present

## 2015-12-10 DIAGNOSIS — Z992 Dependence on renal dialysis: Secondary | ICD-10-CM | POA: Diagnosis not present

## 2015-12-10 DIAGNOSIS — N186 End stage renal disease: Secondary | ICD-10-CM | POA: Diagnosis not present

## 2015-12-12 DIAGNOSIS — Z992 Dependence on renal dialysis: Secondary | ICD-10-CM | POA: Diagnosis not present

## 2015-12-12 DIAGNOSIS — N186 End stage renal disease: Secondary | ICD-10-CM | POA: Diagnosis not present

## 2015-12-14 DIAGNOSIS — Z992 Dependence on renal dialysis: Secondary | ICD-10-CM | POA: Diagnosis not present

## 2015-12-14 DIAGNOSIS — N186 End stage renal disease: Secondary | ICD-10-CM | POA: Diagnosis not present

## 2015-12-17 ENCOUNTER — Ambulatory Visit (INDEPENDENT_AMBULATORY_CARE_PROVIDER_SITE_OTHER): Payer: Medicare Other | Admitting: *Deleted

## 2015-12-17 DIAGNOSIS — N186 End stage renal disease: Secondary | ICD-10-CM | POA: Diagnosis not present

## 2015-12-17 DIAGNOSIS — I6340 Cerebral infarction due to embolism of unspecified cerebral artery: Secondary | ICD-10-CM | POA: Diagnosis not present

## 2015-12-17 DIAGNOSIS — Z992 Dependence on renal dialysis: Secondary | ICD-10-CM | POA: Diagnosis not present

## 2015-12-17 NOTE — Progress Notes (Signed)
Carelink Summary Report / Loop Recorder 

## 2015-12-19 DIAGNOSIS — N186 End stage renal disease: Secondary | ICD-10-CM | POA: Diagnosis not present

## 2015-12-19 DIAGNOSIS — Z992 Dependence on renal dialysis: Secondary | ICD-10-CM | POA: Diagnosis not present

## 2015-12-21 DIAGNOSIS — Z992 Dependence on renal dialysis: Secondary | ICD-10-CM | POA: Diagnosis not present

## 2015-12-21 DIAGNOSIS — N186 End stage renal disease: Secondary | ICD-10-CM | POA: Diagnosis not present

## 2015-12-24 DIAGNOSIS — Z992 Dependence on renal dialysis: Secondary | ICD-10-CM | POA: Diagnosis not present

## 2015-12-24 DIAGNOSIS — Z794 Long term (current) use of insulin: Secondary | ICD-10-CM | POA: Diagnosis not present

## 2015-12-24 DIAGNOSIS — N186 End stage renal disease: Secondary | ICD-10-CM | POA: Diagnosis not present

## 2015-12-24 DIAGNOSIS — E119 Type 2 diabetes mellitus without complications: Secondary | ICD-10-CM | POA: Diagnosis not present

## 2015-12-26 DIAGNOSIS — N186 End stage renal disease: Secondary | ICD-10-CM | POA: Diagnosis not present

## 2015-12-26 DIAGNOSIS — Z992 Dependence on renal dialysis: Secondary | ICD-10-CM | POA: Diagnosis not present

## 2015-12-28 DIAGNOSIS — N186 End stage renal disease: Secondary | ICD-10-CM | POA: Diagnosis not present

## 2015-12-28 DIAGNOSIS — Z992 Dependence on renal dialysis: Secondary | ICD-10-CM | POA: Diagnosis not present

## 2015-12-31 DIAGNOSIS — Z992 Dependence on renal dialysis: Secondary | ICD-10-CM | POA: Diagnosis not present

## 2015-12-31 DIAGNOSIS — N186 End stage renal disease: Secondary | ICD-10-CM | POA: Diagnosis not present

## 2016-01-01 DIAGNOSIS — L603 Nail dystrophy: Secondary | ICD-10-CM | POA: Diagnosis not present

## 2016-01-01 DIAGNOSIS — M79671 Pain in right foot: Secondary | ICD-10-CM | POA: Diagnosis not present

## 2016-01-01 DIAGNOSIS — L851 Acquired keratosis [keratoderma] palmaris et plantaris: Secondary | ICD-10-CM | POA: Diagnosis not present

## 2016-01-01 DIAGNOSIS — E1142 Type 2 diabetes mellitus with diabetic polyneuropathy: Secondary | ICD-10-CM | POA: Diagnosis not present

## 2016-01-01 LAB — CUP PACEART REMOTE DEVICE CHECK: MDC IDC SESS DTM: 20170703050626

## 2016-01-02 DIAGNOSIS — Z992 Dependence on renal dialysis: Secondary | ICD-10-CM | POA: Diagnosis not present

## 2016-01-02 DIAGNOSIS — N186 End stage renal disease: Secondary | ICD-10-CM | POA: Diagnosis not present

## 2016-01-04 DIAGNOSIS — Z992 Dependence on renal dialysis: Secondary | ICD-10-CM | POA: Diagnosis not present

## 2016-01-04 DIAGNOSIS — N186 End stage renal disease: Secondary | ICD-10-CM | POA: Diagnosis not present

## 2016-01-07 DIAGNOSIS — Z992 Dependence on renal dialysis: Secondary | ICD-10-CM | POA: Diagnosis not present

## 2016-01-07 DIAGNOSIS — N186 End stage renal disease: Secondary | ICD-10-CM | POA: Diagnosis not present

## 2016-01-08 DIAGNOSIS — D510 Vitamin B12 deficiency anemia due to intrinsic factor deficiency: Secondary | ICD-10-CM | POA: Diagnosis not present

## 2016-01-08 DIAGNOSIS — N186 End stage renal disease: Secondary | ICD-10-CM | POA: Diagnosis not present

## 2016-01-08 DIAGNOSIS — I10 Essential (primary) hypertension: Secondary | ICD-10-CM | POA: Diagnosis not present

## 2016-01-08 DIAGNOSIS — E1122 Type 2 diabetes mellitus with diabetic chronic kidney disease: Secondary | ICD-10-CM | POA: Diagnosis not present

## 2016-01-09 DIAGNOSIS — Z992 Dependence on renal dialysis: Secondary | ICD-10-CM | POA: Diagnosis not present

## 2016-01-09 DIAGNOSIS — N186 End stage renal disease: Secondary | ICD-10-CM | POA: Diagnosis not present

## 2016-01-11 DIAGNOSIS — N186 End stage renal disease: Secondary | ICD-10-CM | POA: Diagnosis not present

## 2016-01-11 DIAGNOSIS — Z992 Dependence on renal dialysis: Secondary | ICD-10-CM | POA: Diagnosis not present

## 2016-01-14 DIAGNOSIS — Z992 Dependence on renal dialysis: Secondary | ICD-10-CM | POA: Diagnosis not present

## 2016-01-14 DIAGNOSIS — N186 End stage renal disease: Secondary | ICD-10-CM | POA: Diagnosis not present

## 2016-01-16 ENCOUNTER — Ambulatory Visit (INDEPENDENT_AMBULATORY_CARE_PROVIDER_SITE_OTHER): Payer: Medicare Other | Admitting: *Deleted

## 2016-01-16 DIAGNOSIS — N186 End stage renal disease: Secondary | ICD-10-CM | POA: Diagnosis not present

## 2016-01-16 DIAGNOSIS — I6340 Cerebral infarction due to embolism of unspecified cerebral artery: Secondary | ICD-10-CM

## 2016-01-16 DIAGNOSIS — Z992 Dependence on renal dialysis: Secondary | ICD-10-CM | POA: Diagnosis not present

## 2016-01-16 NOTE — Progress Notes (Signed)
Carelink Summary Report / Loop Recorder 

## 2016-01-18 DIAGNOSIS — Z992 Dependence on renal dialysis: Secondary | ICD-10-CM | POA: Diagnosis not present

## 2016-01-18 DIAGNOSIS — N186 End stage renal disease: Secondary | ICD-10-CM | POA: Diagnosis not present

## 2016-01-21 DIAGNOSIS — Z992 Dependence on renal dialysis: Secondary | ICD-10-CM | POA: Diagnosis not present

## 2016-01-21 DIAGNOSIS — N186 End stage renal disease: Secondary | ICD-10-CM | POA: Diagnosis not present

## 2016-01-22 DIAGNOSIS — I129 Hypertensive chronic kidney disease with stage 1 through stage 4 chronic kidney disease, or unspecified chronic kidney disease: Secondary | ICD-10-CM | POA: Diagnosis not present

## 2016-01-22 DIAGNOSIS — N186 End stage renal disease: Secondary | ICD-10-CM | POA: Diagnosis not present

## 2016-01-22 DIAGNOSIS — I119 Hypertensive heart disease without heart failure: Secondary | ICD-10-CM | POA: Diagnosis not present

## 2016-01-22 DIAGNOSIS — E7800 Pure hypercholesterolemia, unspecified: Secondary | ICD-10-CM | POA: Diagnosis not present

## 2016-01-23 DIAGNOSIS — Z992 Dependence on renal dialysis: Secondary | ICD-10-CM | POA: Diagnosis not present

## 2016-01-23 DIAGNOSIS — N186 End stage renal disease: Secondary | ICD-10-CM | POA: Diagnosis not present

## 2016-01-25 DIAGNOSIS — Z992 Dependence on renal dialysis: Secondary | ICD-10-CM | POA: Diagnosis not present

## 2016-01-25 DIAGNOSIS — N186 End stage renal disease: Secondary | ICD-10-CM | POA: Diagnosis not present

## 2016-01-28 DIAGNOSIS — Z992 Dependence on renal dialysis: Secondary | ICD-10-CM | POA: Diagnosis not present

## 2016-01-28 DIAGNOSIS — N186 End stage renal disease: Secondary | ICD-10-CM | POA: Diagnosis not present

## 2016-01-28 LAB — CUP PACEART REMOTE DEVICE CHECK: MDC IDC SESS DTM: 20170802050646

## 2016-01-30 DIAGNOSIS — N186 End stage renal disease: Secondary | ICD-10-CM | POA: Diagnosis not present

## 2016-01-30 DIAGNOSIS — Z992 Dependence on renal dialysis: Secondary | ICD-10-CM | POA: Diagnosis not present

## 2016-02-01 DIAGNOSIS — N186 End stage renal disease: Secondary | ICD-10-CM | POA: Diagnosis not present

## 2016-02-01 DIAGNOSIS — Z992 Dependence on renal dialysis: Secondary | ICD-10-CM | POA: Diagnosis not present

## 2016-02-04 DIAGNOSIS — N186 End stage renal disease: Secondary | ICD-10-CM | POA: Diagnosis not present

## 2016-02-04 DIAGNOSIS — Z992 Dependence on renal dialysis: Secondary | ICD-10-CM | POA: Diagnosis not present

## 2016-02-06 DIAGNOSIS — Z992 Dependence on renal dialysis: Secondary | ICD-10-CM | POA: Diagnosis not present

## 2016-02-06 DIAGNOSIS — N186 End stage renal disease: Secondary | ICD-10-CM | POA: Diagnosis not present

## 2016-02-08 DIAGNOSIS — Z992 Dependence on renal dialysis: Secondary | ICD-10-CM | POA: Diagnosis not present

## 2016-02-08 DIAGNOSIS — N186 End stage renal disease: Secondary | ICD-10-CM | POA: Diagnosis not present

## 2016-02-11 DIAGNOSIS — N186 End stage renal disease: Secondary | ICD-10-CM | POA: Diagnosis not present

## 2016-02-11 DIAGNOSIS — Z992 Dependence on renal dialysis: Secondary | ICD-10-CM | POA: Diagnosis not present

## 2016-02-12 DIAGNOSIS — D510 Vitamin B12 deficiency anemia due to intrinsic factor deficiency: Secondary | ICD-10-CM | POA: Diagnosis not present

## 2016-02-13 DIAGNOSIS — Z992 Dependence on renal dialysis: Secondary | ICD-10-CM | POA: Diagnosis not present

## 2016-02-13 DIAGNOSIS — N186 End stage renal disease: Secondary | ICD-10-CM | POA: Diagnosis not present

## 2016-02-14 DIAGNOSIS — N186 End stage renal disease: Secondary | ICD-10-CM | POA: Diagnosis not present

## 2016-02-14 DIAGNOSIS — Z992 Dependence on renal dialysis: Secondary | ICD-10-CM | POA: Diagnosis not present

## 2016-02-15 ENCOUNTER — Ambulatory Visit (INDEPENDENT_AMBULATORY_CARE_PROVIDER_SITE_OTHER): Payer: Medicare Other | Admitting: *Deleted

## 2016-02-15 DIAGNOSIS — N186 End stage renal disease: Secondary | ICD-10-CM | POA: Diagnosis not present

## 2016-02-15 DIAGNOSIS — I6340 Cerebral infarction due to embolism of unspecified cerebral artery: Secondary | ICD-10-CM | POA: Diagnosis not present

## 2016-02-15 DIAGNOSIS — Z992 Dependence on renal dialysis: Secondary | ICD-10-CM | POA: Diagnosis not present

## 2016-02-15 NOTE — Progress Notes (Signed)
Carelink Summary Report / Loop Recorder 

## 2016-02-18 DIAGNOSIS — Z992 Dependence on renal dialysis: Secondary | ICD-10-CM | POA: Diagnosis not present

## 2016-02-18 DIAGNOSIS — N186 End stage renal disease: Secondary | ICD-10-CM | POA: Diagnosis not present

## 2016-02-20 DIAGNOSIS — Z992 Dependence on renal dialysis: Secondary | ICD-10-CM | POA: Diagnosis not present

## 2016-02-20 DIAGNOSIS — N186 End stage renal disease: Secondary | ICD-10-CM | POA: Diagnosis not present

## 2016-02-21 ENCOUNTER — Telehealth: Payer: Self-pay | Admitting: *Deleted

## 2016-02-21 NOTE — Telephone Encounter (Signed)
Spoke with patient's daughter to request that she send a manual transmission from patient's home monitor.  Patient's daughter states that she is not home right now but she will call back for assistance with transmitting later.

## 2016-02-22 DIAGNOSIS — Z992 Dependence on renal dialysis: Secondary | ICD-10-CM | POA: Diagnosis not present

## 2016-02-22 DIAGNOSIS — N186 End stage renal disease: Secondary | ICD-10-CM | POA: Diagnosis not present

## 2016-02-25 DIAGNOSIS — N186 End stage renal disease: Secondary | ICD-10-CM | POA: Diagnosis not present

## 2016-02-25 DIAGNOSIS — Z992 Dependence on renal dialysis: Secondary | ICD-10-CM | POA: Diagnosis not present

## 2016-02-27 DIAGNOSIS — Z992 Dependence on renal dialysis: Secondary | ICD-10-CM | POA: Diagnosis not present

## 2016-02-27 DIAGNOSIS — N186 End stage renal disease: Secondary | ICD-10-CM | POA: Diagnosis not present

## 2016-02-27 NOTE — Telephone Encounter (Signed)
LMOVM requesting call back.  Gave device clinic phone number for return call.

## 2016-02-29 DIAGNOSIS — N186 End stage renal disease: Secondary | ICD-10-CM | POA: Diagnosis not present

## 2016-02-29 DIAGNOSIS — Z992 Dependence on renal dialysis: Secondary | ICD-10-CM | POA: Diagnosis not present

## 2016-03-03 DIAGNOSIS — Z992 Dependence on renal dialysis: Secondary | ICD-10-CM | POA: Diagnosis not present

## 2016-03-03 DIAGNOSIS — N186 End stage renal disease: Secondary | ICD-10-CM | POA: Diagnosis not present

## 2016-03-05 DIAGNOSIS — N186 End stage renal disease: Secondary | ICD-10-CM | POA: Diagnosis not present

## 2016-03-05 DIAGNOSIS — Z992 Dependence on renal dialysis: Secondary | ICD-10-CM | POA: Diagnosis not present

## 2016-03-07 DIAGNOSIS — N186 End stage renal disease: Secondary | ICD-10-CM | POA: Diagnosis not present

## 2016-03-07 DIAGNOSIS — Z992 Dependence on renal dialysis: Secondary | ICD-10-CM | POA: Diagnosis not present

## 2016-03-10 DIAGNOSIS — Z992 Dependence on renal dialysis: Secondary | ICD-10-CM | POA: Diagnosis not present

## 2016-03-10 DIAGNOSIS — N186 End stage renal disease: Secondary | ICD-10-CM | POA: Diagnosis not present

## 2016-03-10 LAB — CUP PACEART REMOTE DEVICE CHECK: MDC IDC SESS DTM: 20170901053641

## 2016-03-11 DIAGNOSIS — D510 Vitamin B12 deficiency anemia due to intrinsic factor deficiency: Secondary | ICD-10-CM | POA: Diagnosis not present

## 2016-03-12 DIAGNOSIS — Z992 Dependence on renal dialysis: Secondary | ICD-10-CM | POA: Diagnosis not present

## 2016-03-12 DIAGNOSIS — N186 End stage renal disease: Secondary | ICD-10-CM | POA: Diagnosis not present

## 2016-03-14 DIAGNOSIS — Z992 Dependence on renal dialysis: Secondary | ICD-10-CM | POA: Diagnosis not present

## 2016-03-14 DIAGNOSIS — N186 End stage renal disease: Secondary | ICD-10-CM | POA: Diagnosis not present

## 2016-03-15 DIAGNOSIS — Z992 Dependence on renal dialysis: Secondary | ICD-10-CM | POA: Diagnosis not present

## 2016-03-15 DIAGNOSIS — N186 End stage renal disease: Secondary | ICD-10-CM | POA: Diagnosis not present

## 2016-03-17 ENCOUNTER — Ambulatory Visit (INDEPENDENT_AMBULATORY_CARE_PROVIDER_SITE_OTHER): Payer: Medicare Other | Admitting: *Deleted

## 2016-03-17 DIAGNOSIS — Z992 Dependence on renal dialysis: Secondary | ICD-10-CM | POA: Diagnosis not present

## 2016-03-17 DIAGNOSIS — N186 End stage renal disease: Secondary | ICD-10-CM | POA: Diagnosis not present

## 2016-03-17 DIAGNOSIS — I6340 Cerebral infarction due to embolism of unspecified cerebral artery: Secondary | ICD-10-CM | POA: Diagnosis not present

## 2016-03-17 NOTE — Telephone Encounter (Signed)
Spoke with patient's daughter.  Requested that she send a manual transmission from patient's home monitor for a software update.  Walked patient's daughter through process.  She verbalizes understanding of instructions.  Patient's daughter will send a transmission later this week.  [facility] phone number for any additional assistance she may needs.

## 2016-03-17 NOTE — Progress Notes (Signed)
Carelink Summary Report / Loop Recorder 

## 2016-03-19 DIAGNOSIS — N186 End stage renal disease: Secondary | ICD-10-CM | POA: Diagnosis not present

## 2016-03-19 DIAGNOSIS — Z992 Dependence on renal dialysis: Secondary | ICD-10-CM | POA: Diagnosis not present

## 2016-03-21 DIAGNOSIS — N186 End stage renal disease: Secondary | ICD-10-CM | POA: Diagnosis not present

## 2016-03-21 DIAGNOSIS — Z992 Dependence on renal dialysis: Secondary | ICD-10-CM | POA: Diagnosis not present

## 2016-03-24 DIAGNOSIS — E119 Type 2 diabetes mellitus without complications: Secondary | ICD-10-CM | POA: Diagnosis not present

## 2016-03-24 DIAGNOSIS — Z992 Dependence on renal dialysis: Secondary | ICD-10-CM | POA: Diagnosis not present

## 2016-03-24 DIAGNOSIS — N186 End stage renal disease: Secondary | ICD-10-CM | POA: Diagnosis not present

## 2016-03-26 DIAGNOSIS — N186 End stage renal disease: Secondary | ICD-10-CM | POA: Diagnosis not present

## 2016-03-26 DIAGNOSIS — Z992 Dependence on renal dialysis: Secondary | ICD-10-CM | POA: Diagnosis not present

## 2016-03-26 NOTE — Telephone Encounter (Signed)
LMOVM requesting call back.  Gave device clinic phone number.  We still have not received a manual transmission from patient's home monitor.

## 2016-03-28 DIAGNOSIS — N186 End stage renal disease: Secondary | ICD-10-CM | POA: Diagnosis not present

## 2016-03-28 DIAGNOSIS — Z992 Dependence on renal dialysis: Secondary | ICD-10-CM | POA: Diagnosis not present

## 2016-03-31 DIAGNOSIS — Z992 Dependence on renal dialysis: Secondary | ICD-10-CM | POA: Diagnosis not present

## 2016-03-31 DIAGNOSIS — N186 End stage renal disease: Secondary | ICD-10-CM | POA: Diagnosis not present

## 2016-04-02 DIAGNOSIS — Z992 Dependence on renal dialysis: Secondary | ICD-10-CM | POA: Diagnosis not present

## 2016-04-02 DIAGNOSIS — N186 End stage renal disease: Secondary | ICD-10-CM | POA: Diagnosis not present

## 2016-04-03 ENCOUNTER — Encounter: Payer: Self-pay | Admitting: *Deleted

## 2016-04-03 NOTE — Telephone Encounter (Signed)
LMOVM requesting call back.  [facility] phone number for return call.  Manual transmission still not received.  Monitor no longer transmitting nightly/automatically due to false RRT.

## 2016-04-03 NOTE — Telephone Encounter (Signed)
Encounter closed.  Letter mailed.

## 2016-04-04 ENCOUNTER — Telehealth: Payer: Self-pay | Admitting: Cardiology

## 2016-04-04 DIAGNOSIS — Z992 Dependence on renal dialysis: Secondary | ICD-10-CM | POA: Diagnosis not present

## 2016-04-04 DIAGNOSIS — N186 End stage renal disease: Secondary | ICD-10-CM | POA: Diagnosis not present

## 2016-04-04 NOTE — Telephone Encounter (Signed)
LMOVM requesting that pt send manual transmission b/c home monitor has not updated in at least 14 days.    

## 2016-04-07 DIAGNOSIS — Z992 Dependence on renal dialysis: Secondary | ICD-10-CM | POA: Diagnosis not present

## 2016-04-07 DIAGNOSIS — N186 End stage renal disease: Secondary | ICD-10-CM | POA: Diagnosis not present

## 2016-04-08 ENCOUNTER — Emergency Department (HOSPITAL_COMMUNITY)
Admission: EM | Admit: 2016-04-08 | Discharge: 2016-04-08 | Disposition: A | Discharge status: Home / Self Care | Payer: Medicare Other | Attending: Emergency Medicine | Admitting: Emergency Medicine

## 2016-04-08 ENCOUNTER — Emergency Department (HOSPITAL_COMMUNITY): Payer: Medicare Other

## 2016-04-08 ENCOUNTER — Encounter (HOSPITAL_COMMUNITY): Payer: Self-pay | Admitting: Emergency Medicine

## 2016-04-08 DIAGNOSIS — Z7982 Long term (current) use of aspirin: Secondary | ICD-10-CM | POA: Diagnosis not present

## 2016-04-08 DIAGNOSIS — E1165 Type 2 diabetes mellitus with hyperglycemia: Secondary | ICD-10-CM | POA: Diagnosis not present

## 2016-04-08 DIAGNOSIS — N186 End stage renal disease: Secondary | ICD-10-CM | POA: Insufficient documentation

## 2016-04-08 DIAGNOSIS — Z79899 Other long term (current) drug therapy: Secondary | ICD-10-CM | POA: Insufficient documentation

## 2016-04-08 DIAGNOSIS — I120 Hypertensive chronic kidney disease with stage 5 chronic kidney disease or end stage renal disease: Secondary | ICD-10-CM | POA: Insufficient documentation

## 2016-04-08 DIAGNOSIS — R739 Hyperglycemia, unspecified: Secondary | ICD-10-CM

## 2016-04-08 DIAGNOSIS — Z8673 Personal history of transient ischemic attack (TIA), and cerebral infarction without residual deficits: Secondary | ICD-10-CM | POA: Diagnosis not present

## 2016-04-08 DIAGNOSIS — Z992 Dependence on renal dialysis: Secondary | ICD-10-CM | POA: Diagnosis not present

## 2016-04-08 DIAGNOSIS — R251 Tremor, unspecified: Secondary | ICD-10-CM | POA: Diagnosis not present

## 2016-04-08 DIAGNOSIS — M62830 Muscle spasm of back: Secondary | ICD-10-CM | POA: Diagnosis not present

## 2016-04-08 DIAGNOSIS — E1122 Type 2 diabetes mellitus with diabetic chronic kidney disease: Secondary | ICD-10-CM | POA: Diagnosis not present

## 2016-04-08 DIAGNOSIS — R42 Dizziness and giddiness: Secondary | ICD-10-CM | POA: Diagnosis not present

## 2016-04-08 DIAGNOSIS — R531 Weakness: Secondary | ICD-10-CM | POA: Diagnosis not present

## 2016-04-08 DIAGNOSIS — Z794 Long term (current) use of insulin: Secondary | ICD-10-CM | POA: Insufficient documentation

## 2016-04-08 DIAGNOSIS — E1140 Type 2 diabetes mellitus with diabetic neuropathy, unspecified: Secondary | ICD-10-CM | POA: Diagnosis not present

## 2016-04-08 LAB — COMPREHENSIVE METABOLIC PANEL
ALK PHOS: 64 U/L (ref 38–126)
ALT: 17 U/L (ref 14–54)
ANION GAP: 10 (ref 5–15)
AST: 22 U/L (ref 15–41)
Albumin: 3.5 g/dL (ref 3.5–5.0)
BILIRUBIN TOTAL: 0.8 mg/dL (ref 0.3–1.2)
BUN: 26 mg/dL — ABNORMAL HIGH (ref 6–20)
CALCIUM: 8.6 mg/dL — AB (ref 8.9–10.3)
CO2: 29 mmol/L (ref 22–32)
CREATININE: 6.97 mg/dL — AB (ref 0.44–1.00)
Chloride: 102 mmol/L (ref 101–111)
GFR, EST AFRICAN AMERICAN: 6 mL/min — AB (ref >60)
GFR, EST NON AFRICAN AMERICAN: 5 mL/min — AB (ref >60)
Glucose, Bld: 233 mg/dL — ABNORMAL HIGH (ref 65–99)
Potassium: 4.8 mmol/L (ref 3.5–5.1)
SODIUM: 141 mmol/L (ref 135–145)
TOTAL PROTEIN: 7.2 g/dL (ref 6.5–8.1)

## 2016-04-08 LAB — CBC
HCT: 32.4 % — ABNORMAL LOW (ref 36.0–46.0)
Hemoglobin: 10.5 g/dL — ABNORMAL LOW (ref 12.0–15.0)
MCH: 30.8 pg (ref 26.0–34.0)
MCHC: 32.4 g/dL (ref 30.0–36.0)
MCV: 95 fL (ref 78.0–100.0)
PLATELETS: 142 10*3/uL — AB (ref 150–400)
RBC: 3.41 MIL/uL — ABNORMAL LOW (ref 3.87–5.11)
RDW: 14 % (ref 11.5–15.5)
WBC: 11.7 10*3/uL — AB (ref 4.0–10.5)

## 2016-04-08 NOTE — ED Triage Notes (Signed)
Pt sts episodes of shaking since last night; pt dialysis pt with last dialysis yesterday

## 2016-04-08 NOTE — ED Provider Notes (Signed)
Patient with tremors onset this morning which have resolved since arrival here. She denies focal numbness or weakness. Patient walks with walker at baseline Denies chest pain or shortness of breath. Denies fever. Presently asymptomatic. On exam no distress lungs clear auscultation heart regular rate and rhythm abdomen nondistended nontender extremities no edema neurologic cranial nerves II through XII grossly intact. Moves all extremities well. Motor strength 5 over 5 overall. Able to walk with minimal assistance   Orlie Dakin, MD 04/08/16 2245

## 2016-04-08 NOTE — ED Notes (Addendum)
Assessment: Pt. Appears to shake/ tremor for short periods of time. HR will increase in to 130's and then come back down

## 2016-04-08 NOTE — ED Notes (Signed)
MD at bedside. 

## 2016-04-08 NOTE — ED Provider Notes (Signed)
[facility] DEPT Provider Note   CSN: [id number] Arrival date & time: 04/08/16  1426    History   Chief Complaint Chief Complaint  Patient presents with  . Shaking    HPI [name] is a 75 y.o. 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 She states that she went to dialysis yesterday and had no issues. The patient reportedly has been seen to shake in her hands intermittently. She has no change in her level of alertness during these episodes, tongue biting, incontinence. She has not fallen because of them. These episodes seem to come on at random and are not necessarily associated with purposeful movements, like in an intention tremor. She states that when it comes on she feels slightly shaky on her feet, but no abnormal gait, no falls. No pill rolling movements or cogwheel rigidity. No history of parkinson's, seizures. She is seen to have intermittent episodes on exam.   HPI  Past Medical History:  Diagnosis Date  . Anemia   . Cataract   . Chronic kidney disease   . CVA (cerebral infarction)   . Diabetes mellitus    Type 2  . Diabetes mellitus without complication ([place])   . Dialysis patient ([place])   . Dialysis patient ([place])    M, W, F  . Fistula    R arm  . GERD (gastroesophageal reflux disease)   . Hypertension   . Renal disorder   . Shortness of breath   . Stroke [facility])    right side weakness    Patient Active Problem List   Diagnosis Date Noted  . DM type 2, goal A1c below 7   . HCAP (healthcare-associated pneumonia) 05/29/2015  . Hyperkalemia 05/29/2015  . Diabetes mellitus with complication ([place])   . ESRD (end stage renal disease) on dialysis ([place])   . Encounter for adequacy testing for hemodialysis ([place]) 01/17/2014  . Mechanical complication of  other vascular device, implant, and graft 01/17/2014  . Disturbances of vision, late effect of stroke 11/03/2013  . Right hemiparesis ([place]) 08/19/2013  . CVA (cerebral infarction) 07/20/2013  . Gastroparesis due to DM ([place]) 07/01/2013  . End stage renal disease ([place]) 06/25/2013  . Anemia 01/26/2013  . DM W/NEURO MNFST, TYPE II, UNCONTROLLED 11/19/2006  . Leukocytosis 11/19/2006  . DEPENDENT EDEMA, LEGS, BILATERAL 11/19/2006  . SINUS TACHYCARDIA 10/16/2006  . Diabetes mellitus, type 2 ([place]) 05/20/2006  . HYPERLIPIDEMIA 05/20/2006  . SYNDROME, RESTLESS LEGS 05/20/2006  . PERIPHERAL NEUROPATHY 05/20/2006  . Hypertension 05/20/2006    Past Surgical History:  Procedure Laterality Date  . AV FISTULA PLACEMENT Right 09/08/2013   Procedure: CREATION OF RIGHT BRACHIAL CEPHALIC ARTERIOVENOUS FISTULA ;  Surgeon: Mal Misty, MD;  Location: [facility];  Service: Vascular;  Laterality: Right;  . BASCILIC VEIN TRANSPOSITION Right 01/26/2014   Procedure: Right Arm BASILIC VEIN TRANSPOSITION;  Surgeon: Mal Misty, MD;  Location: [facility];  Service: Vascular;  Laterality: Right;  . CATARACT EXTRACTION W/PHACO  11/20/2011   Procedure: CATARACT EXTRACTION PHACO AND INTRAOCULAR LENS PLACEMENT (IOC);  Surgeon: Tonny Branch, MD;  Location: [facility];  Service: Ophthalmology;  Laterality: Right;  CDE 18.82  . CATARACT EXTRACTION W/PHACO Left 11/18/2012   Procedure: CATARACT EXTRACTION PHACO AND INTRAOCULAR LENS PLACEMENT (IOC);  Surgeon: Tonny Branch, MD;  Location: [facility];  Service: Ophthalmology;  Laterality: Left;  CDE: 18.97  . COLONOSCOPY N/A 02/09/2013   Procedure: COLONOSCOPY;  Surgeon: Rogene Houston, MD;  [facility];  Service: Endoscopy;  Laterality: N/A;  305-moved to 220 Ann to notify pt  . INSERTION OF DIALYSIS CATHETER Right 06/24/2013   Procedure: INSERTION OF DIALYSIS CATHETER: Ultrasound guided;  Surgeon: Serafina Mitchell, MD;  Location: [facility];  Service: Vascular;  Laterality: Right;  . LIGATION  OF ARTERIOVENOUS  FISTULA Right 01/26/2014   Procedure: LIGATION OF ARTERIOVENOUS  FISTULA;  Surgeon: Mal Misty, MD;  Location: [facility];  Service: Vascular;  Laterality: Right;  . 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 LOOP RECORDER IMPLANT N/A 07/21/2013   Procedure: LOOP RECORDER IMPLANT;  Surgeon: Evans Lance, MD;  Location[facility] CATH LAB;  Service: Cardiovascular;  Laterality: N/A;  . TEE WITHOUT CARDIOVERSION N/A 07/21/2013   Procedure: TRANSESOPHAGEAL ECHOCARDIOGRAM (TEE);  Surgeon: Dorothy Spark, MD;  Location: [facility] ENDOSCOPY;  Service: Cardiovascular;  Laterality: N/A;    OB History    Gravida Para Term Preterm AB Living   0 0 0 0 0     SAB TAB Ectopic Multiple Live Births   0 0 0           Home Medications    Prior to Admission medications   Medication Sig Start Date End Date Taking? Authorizing Provider  albuterol (PROVENTIL HFA;VENTOLIN HFA) 108 (90 BASE) MCG/ACT inhaler Inhale 1-2 puffs into the lungs every 6 (six) hours as needed for wheezing. Use 2 puffs 3 times daily x 4 days then as needed. 06/03/15  Yes Eugenie Filler, MD  aspirin 81 MG chewable tablet Chew 81 mg by mouth daily.   Yes Historical Provider, MD  atorvastatin (LIPITOR) 20 MG tablet Take 1 tablet by mouth daily. 11/09/14  Yes Historical Provider, MD  cyanocobalamin (,VITAMIN B-12,) 1000 MCG/ML injection Inject 1 mL into the muscle every 30 (thirty) days. 11/01/14  Yes Historical Provider, MD  ibuprofen (ADVIL,MOTRIN) 200 MG tablet Take 200-400 mg by mouth every 6 (six) hours as needed for moderate pain.    Yes Historical Provider, MD  insulin NPH Human (HUMULIN N,NOVOLIN N) 100 UNIT/ML injection Inject 10 Units into the skin 2 (two) times daily before a meal.   Yes Historical Provider, MD  labetalol (NORMODYNE) 300 MG tablet Take 300 mg by mouth 2 (two) times daily. 11/06/14  Yes Historical Provider, MD  meclizine (ANTIVERT) 25 MG tablet Take 1 tablet by  mouth every 6 (six) hours as needed for dizziness.  11/07/14  Yes Historical Provider, MD  multivitamin (RENA-VIT) TABS tablet Take 1 tablet by mouth at bedtime. 08/21/14  Yes Charlett Blake, MD  amoxicillin-clavulanate (AUGMENTIN) 500-125 MG tablet Take 1 tablet (500 mg total) by mouth daily. Take for 4 days then stop. Patient not taking: Reported on 04/08/2016 06/03/15   Eugenie Filler, MD  fluticasone [facility]) 50 MCG/ACT nasal spray Place 2 sprays into both nostrils daily. Use for 5 days then as needed. Patient not taking: Reported on 04/08/2016 06/03/15   Eugenie Filler, MD  guaiFENesin (MUCINEX) 600 MG 12 hr tablet Take 2 tablets (1,200 mg total) by mouth 2 (two) times daily. Take for 5 days then stop. Patient not taking: Reported on 04/08/2016 06/03/15   Eugenie Filler, MD  predniSONE (DELTASONE) 20 MG tablet Take  1-3 tablets (20-60 mg total) by mouth daily before breakfast. Take 3 tablets (60mg ) daily x 1 day, then 2 tablets (40mg )daily x 3 days, then 1 tablet (20mg )daily x 3 days then stop. Patient not taking: Reported on 04/08/2016 06/03/15   Eugenie Filler, MD    Family History Family History  Problem Relation Age of Onset  . Anesthesia problems Neg Hx   . Hypotension Neg Hx   . Malignant hyperthermia Neg Hx   . Pseudochol deficiency Neg Hx   . Cancer Sister   . Cancer Brother     Social History Social History  Substance Use Topics  . Smoking status: Never Smoker  . Smokeless tobacco: Not on file  . Alcohol use No     Allergies   Ambien [zolpidem]; Reglan [metoclopramide]; and Reglan [metoclopramide]   Review of Systems Review of Systems  Constitutional: Positive for activity change. Negative for chills and fever.  HENT: Negative for congestion, rhinorrhea, sinus pressure and sneezing.   Respiratory: Negative for cough and wheezing.   Cardiovascular: Negative for chest pain and palpitations.  Gastrointestinal: Negative for abdominal pain, nausea and  vomiting.  Musculoskeletal: Negative for back pain, gait problem and neck pain.  Skin: Negative for rash.  Neurological: Positive for tremors. Negative for dizziness, seizures, syncope, facial asymmetry, speech difficulty, weakness, light-headedness, numbness and headaches.  Psychiatric/Behavioral: Negative for confusion.  All other systems reviewed and are negative.    Physical Exam Updated Vital Signs BP 199/74   Pulse 80   Temp 98.4 F (36.9 C) (Oral)   Resp 15   SpO2 98%   Physical Exam  Constitutional: She is oriented to person, place, and time. She appears well-developed and well-nourished. No distress.  HENT:  Head: Normocephalic and atraumatic.  Nose: Nose normal.  Mouth/Throat: Oropharynx is clear and moist.  Eyes: Conjunctivae and EOM are normal. Pupils are equal, round, and reactive to light.  Neck: Neck supple.  Cardiovascular: Normal rate, regular rhythm, normal heart sounds and intact distal pulses.   Pulmonary/Chest: Effort normal and breath sounds normal.  Abdominal: Soft. She exhibits no distension. There is no tenderness.  Musculoskeletal: She exhibits no edema or tenderness.  Neurological: She is alert and oriented to person, place, and time. She has normal strength. She displays tremor. No cranial nerve deficit or sensory deficit. She displays a negative Romberg sign. She displays no seizure activity. Coordination and gait normal.  Coarse tremors noted to wax and wane intermittently in her b/l hands and wrists. Intermittent kicks in her LE. 5/5 strength in all 4 extremities. No dysmetria with finger to nose or heel to shin. Able to ambulate without difficulty. No pronator drift.   Skin: Skin is warm and dry. She is not diaphoretic.  Nursing note and vitals reviewed.    ED Treatments / Results  Labs (all labs ordered are listed, but only abnormal results are displayed) Labs Reviewed  COMPREHENSIVE METABOLIC PANEL - Abnormal; Notable for the following:        Result Value   Glucose, Bld 233 (*)    BUN 26 (*)    Creatinine, Ser 6.97 (*)    Calcium 8.6 (*)    GFR calc non Af Amer 5 (*)    GFR calc Af Amer 6 (*)    All other components within normal limits  CBC - Abnormal; Notable for the following:    WBC 11.7 (*)    RBC 3.41 (*)    Hemoglobin 10.5 (*)    HCT  32.4 (*)    Platelets 142 (*)    All other components within normal limits    EKG  EKG Interpretation None       Radiology Ct Head Wo Contrast  Result Date: 04/08/2016 CLINICAL DATA:  75 y/o F; tremors and dizziness from last night. History of CVA. EXAM: CT HEAD WITHOUT CONTRAST TECHNIQUE: Contiguous axial images were obtained from the base of the skull through the vertex without intravenous contrast. COMPARISON:  08/29/2013 CT head.  08/20/2013 MRI brain. FINDINGS: Brain: No evidence of acute infarction, hemorrhage, hydrocephalus, extra-axial collection or mass lesion/mass effect. Nonspecific areas of hypoattenuation in subcortical periventricular white matter are stable and compatible with in chronic microvascular ischemic changes in sequelae of prior infarct as seen prior MRI. Vascular: No hyperdense vessel. Calcific atherosclerosis of cavernous internal carotid arteries bilaterally. Skull: Normal. Negative for fracture or focal lesion. Sinuses/Orbits: Paranasal sinus disease with small fluid levels in the sphenoid sinuses. Mastoid air cells are normally aerated. Bilateral intra-ocular lens replacement. Other: None. IMPRESSION: 1. No acute intracranial abnormality is identified. 2. Stable chronic microvascular ischemic changes and sequelae of infarcts as seen on 2015 MRI. 3. Paranasal sinus disease with small fluid levels which may represent acute sinusitis in the appropriate clinical setting. Electronically Signed   By: Kristine Garbe M.D.   On: 04/08/2016 20:22    Procedures Procedures (including critical care time)  Medications Ordered in ED Medications - No data to  display   Initial Impression / Assessment and Plan / ED Course  I have reviewed the triage vital signs and the nursing notes.  Pertinent labs & imaging results that were available during my care of the patient were reviewed by me and considered in my medical decision making (see chart for details).  Clinical Course   75 y.o. female presents with episodic tremors. Exam as above. Low suspicion for epileptic etiology. Given hx of prior CVA and seemingly acute onset last night with associated mild gait change with the tremors, CT head was ordered and showed no acute findings. Labs were drawn and showed hyperglycemia with glucose of 233, but otherwise no significant abnormalities. Feel that she is experiencing a tremor, low suspicion for new CVA, seizures, or encephalopathy. No recent medications changes, but could potentially be medication reaction. She was recommended to follow up with her PCP in a few days to monitor for improvement in her sx.   Final Clinical Impressions(s) / ED Diagnoses   Final diagnoses:  Tremor  Hyperglycemia    New Prescriptions Discharge Medication List as of 04/08/2016 11:20 PM       Zenovia Jarred, DO 04/10/16 [id number]    Orlie Dakin, MD 04/10/16 [id number]

## 2016-04-09 ENCOUNTER — Encounter: Payer: Self-pay | Admitting: Cardiology

## 2016-04-09 DIAGNOSIS — N186 End stage renal disease: Secondary | ICD-10-CM | POA: Diagnosis not present

## 2016-04-09 DIAGNOSIS — Z992 Dependence on renal dialysis: Secondary | ICD-10-CM | POA: Diagnosis not present

## 2016-04-11 DIAGNOSIS — N186 End stage renal disease: Secondary | ICD-10-CM | POA: Diagnosis not present

## 2016-04-11 DIAGNOSIS — D510 Vitamin B12 deficiency anemia due to intrinsic factor deficiency: Secondary | ICD-10-CM | POA: Diagnosis not present

## 2016-04-11 DIAGNOSIS — Z992 Dependence on renal dialysis: Secondary | ICD-10-CM | POA: Diagnosis not present

## 2016-04-14 DIAGNOSIS — N186 End stage renal disease: Secondary | ICD-10-CM | POA: Diagnosis not present

## 2016-04-14 DIAGNOSIS — Z992 Dependence on renal dialysis: Secondary | ICD-10-CM | POA: Diagnosis not present

## 2016-04-15 ENCOUNTER — Ambulatory Visit (INDEPENDENT_AMBULATORY_CARE_PROVIDER_SITE_OTHER): Payer: Medicare Other | Admitting: *Deleted

## 2016-04-15 DIAGNOSIS — Z992 Dependence on renal dialysis: Secondary | ICD-10-CM | POA: Diagnosis not present

## 2016-04-15 DIAGNOSIS — N186 End stage renal disease: Secondary | ICD-10-CM | POA: Diagnosis not present

## 2016-04-15 DIAGNOSIS — I6340 Cerebral infarction due to embolism of unspecified cerebral artery: Secondary | ICD-10-CM | POA: Diagnosis not present

## 2016-04-16 DIAGNOSIS — N186 End stage renal disease: Secondary | ICD-10-CM | POA: Diagnosis not present

## 2016-04-16 DIAGNOSIS — Z992 Dependence on renal dialysis: Secondary | ICD-10-CM | POA: Diagnosis not present

## 2016-04-16 NOTE — Progress Notes (Signed)
Carelink Summary Report / Loop Recorder 

## 2016-04-18 DIAGNOSIS — Z992 Dependence on renal dialysis: Secondary | ICD-10-CM | POA: Diagnosis not present

## 2016-04-18 DIAGNOSIS — N186 End stage renal disease: Secondary | ICD-10-CM | POA: Diagnosis not present

## 2016-04-21 DIAGNOSIS — Z992 Dependence on renal dialysis: Secondary | ICD-10-CM | POA: Diagnosis not present

## 2016-04-21 DIAGNOSIS — N186 End stage renal disease: Secondary | ICD-10-CM | POA: Diagnosis not present

## 2016-04-23 DIAGNOSIS — Z992 Dependence on renal dialysis: Secondary | ICD-10-CM | POA: Diagnosis not present

## 2016-04-23 DIAGNOSIS — N186 End stage renal disease: Secondary | ICD-10-CM | POA: Diagnosis not present

## 2016-04-25 DIAGNOSIS — Z992 Dependence on renal dialysis: Secondary | ICD-10-CM | POA: Diagnosis not present

## 2016-04-25 DIAGNOSIS — N186 End stage renal disease: Secondary | ICD-10-CM | POA: Diagnosis not present

## 2016-04-25 LAB — CUP PACEART REMOTE DEVICE CHECK
Date Time Interrogation Session: 20171001060946
Implantable Pulse Generator Implant Date: 20150205

## 2016-04-25 NOTE — Progress Notes (Signed)
[employer] summary report received. Battery status false RRT, letter mailed to pt per [employer] for software upgrade to be complete. Normal device function. No new symptom episodes, tachy episodes, brady, or pause episodes. No new AF episodes. Monthly summary reports and ROV/PRN

## 2016-04-28 DIAGNOSIS — N186 End stage renal disease: Secondary | ICD-10-CM | POA: Diagnosis not present

## 2016-04-28 DIAGNOSIS — Z992 Dependence on renal dialysis: Secondary | ICD-10-CM | POA: Diagnosis not present

## 2016-04-29 DIAGNOSIS — L851 Acquired keratosis [keratoderma] palmaris et plantaris: Secondary | ICD-10-CM | POA: Diagnosis not present

## 2016-04-29 DIAGNOSIS — N186 End stage renal disease: Secondary | ICD-10-CM | POA: Diagnosis not present

## 2016-04-29 DIAGNOSIS — E1122 Type 2 diabetes mellitus with diabetic chronic kidney disease: Secondary | ICD-10-CM | POA: Diagnosis not present

## 2016-04-29 DIAGNOSIS — L603 Nail dystrophy: Secondary | ICD-10-CM | POA: Diagnosis not present

## 2016-04-29 DIAGNOSIS — E1142 Type 2 diabetes mellitus with diabetic polyneuropathy: Secondary | ICD-10-CM | POA: Diagnosis not present

## 2016-04-30 DIAGNOSIS — N186 End stage renal disease: Secondary | ICD-10-CM | POA: Diagnosis not present

## 2016-04-30 DIAGNOSIS — Z992 Dependence on renal dialysis: Secondary | ICD-10-CM | POA: Diagnosis not present

## 2016-05-02 DIAGNOSIS — Z992 Dependence on renal dialysis: Secondary | ICD-10-CM | POA: Diagnosis not present

## 2016-05-02 DIAGNOSIS — N186 End stage renal disease: Secondary | ICD-10-CM | POA: Diagnosis not present

## 2016-05-05 DIAGNOSIS — N186 End stage renal disease: Secondary | ICD-10-CM | POA: Diagnosis not present

## 2016-05-05 DIAGNOSIS — Z992 Dependence on renal dialysis: Secondary | ICD-10-CM | POA: Diagnosis not present

## 2016-05-07 DIAGNOSIS — N186 End stage renal disease: Secondary | ICD-10-CM | POA: Diagnosis not present

## 2016-05-07 DIAGNOSIS — Z992 Dependence on renal dialysis: Secondary | ICD-10-CM | POA: Diagnosis not present

## 2016-05-09 DIAGNOSIS — N186 End stage renal disease: Secondary | ICD-10-CM | POA: Diagnosis not present

## 2016-05-09 DIAGNOSIS — Z992 Dependence on renal dialysis: Secondary | ICD-10-CM | POA: Diagnosis not present

## 2016-05-12 DIAGNOSIS — N186 End stage renal disease: Secondary | ICD-10-CM | POA: Diagnosis not present

## 2016-05-12 DIAGNOSIS — Z992 Dependence on renal dialysis: Secondary | ICD-10-CM | POA: Diagnosis not present

## 2016-05-14 DIAGNOSIS — N186 End stage renal disease: Secondary | ICD-10-CM | POA: Diagnosis not present

## 2016-05-14 DIAGNOSIS — Z992 Dependence on renal dialysis: Secondary | ICD-10-CM | POA: Diagnosis not present

## 2016-05-15 ENCOUNTER — Ambulatory Visit (INDEPENDENT_AMBULATORY_CARE_PROVIDER_SITE_OTHER): Payer: Medicare Other | Admitting: *Deleted

## 2016-05-15 DIAGNOSIS — N186 End stage renal disease: Secondary | ICD-10-CM | POA: Diagnosis not present

## 2016-05-15 DIAGNOSIS — I6340 Cerebral infarction due to embolism of unspecified cerebral artery: Secondary | ICD-10-CM | POA: Diagnosis not present

## 2016-05-15 DIAGNOSIS — Z992 Dependence on renal dialysis: Secondary | ICD-10-CM | POA: Diagnosis not present

## 2016-05-15 NOTE — Progress Notes (Signed)
Carelink Summary Report / Loop Recorder 

## 2016-05-16 DIAGNOSIS — N186 End stage renal disease: Secondary | ICD-10-CM | POA: Diagnosis not present

## 2016-05-16 DIAGNOSIS — Z992 Dependence on renal dialysis: Secondary | ICD-10-CM | POA: Diagnosis not present

## 2016-05-19 DIAGNOSIS — N186 End stage renal disease: Secondary | ICD-10-CM | POA: Diagnosis not present

## 2016-05-19 DIAGNOSIS — Z992 Dependence on renal dialysis: Secondary | ICD-10-CM | POA: Diagnosis not present

## 2016-05-21 DIAGNOSIS — N186 End stage renal disease: Secondary | ICD-10-CM | POA: Diagnosis not present

## 2016-05-21 DIAGNOSIS — Z992 Dependence on renal dialysis: Secondary | ICD-10-CM | POA: Diagnosis not present

## 2016-05-23 DIAGNOSIS — N186 End stage renal disease: Secondary | ICD-10-CM | POA: Diagnosis not present

## 2016-05-23 DIAGNOSIS — Z992 Dependence on renal dialysis: Secondary | ICD-10-CM | POA: Diagnosis not present

## 2016-05-23 LAB — CUP PACEART REMOTE DEVICE CHECK
Date Time Interrogation Session: 20171031063622
MDC IDC PG IMPLANT DT: 20150205

## 2016-05-23 NOTE — Progress Notes (Signed)
Carelink summary report received. Battery status OK. Normal device function. No new symptom episodes, tachy episodes, brady, or pause episodes. No new AF episodes. Monthly summary reports and ROV/PRN 

## 2016-05-26 DIAGNOSIS — N186 End stage renal disease: Secondary | ICD-10-CM | POA: Diagnosis not present

## 2016-05-26 DIAGNOSIS — Z992 Dependence on renal dialysis: Secondary | ICD-10-CM | POA: Diagnosis not present

## 2016-05-28 DIAGNOSIS — Z992 Dependence on renal dialysis: Secondary | ICD-10-CM | POA: Diagnosis not present

## 2016-05-28 DIAGNOSIS — N186 End stage renal disease: Secondary | ICD-10-CM | POA: Diagnosis not present

## 2016-05-31 DIAGNOSIS — N186 End stage renal disease: Secondary | ICD-10-CM | POA: Diagnosis not present

## 2016-05-31 DIAGNOSIS — Z992 Dependence on renal dialysis: Secondary | ICD-10-CM | POA: Diagnosis not present

## 2016-06-02 DIAGNOSIS — N186 End stage renal disease: Secondary | ICD-10-CM | POA: Diagnosis not present

## 2016-06-02 DIAGNOSIS — Z992 Dependence on renal dialysis: Secondary | ICD-10-CM | POA: Diagnosis not present

## 2016-06-04 DIAGNOSIS — N186 End stage renal disease: Secondary | ICD-10-CM | POA: Diagnosis not present

## 2016-06-04 DIAGNOSIS — Z992 Dependence on renal dialysis: Secondary | ICD-10-CM | POA: Diagnosis not present

## 2016-06-06 DIAGNOSIS — Z992 Dependence on renal dialysis: Secondary | ICD-10-CM | POA: Diagnosis not present

## 2016-06-06 DIAGNOSIS — N186 End stage renal disease: Secondary | ICD-10-CM | POA: Diagnosis not present

## 2016-06-08 DIAGNOSIS — Z992 Dependence on renal dialysis: Secondary | ICD-10-CM | POA: Diagnosis not present

## 2016-06-08 DIAGNOSIS — N186 End stage renal disease: Secondary | ICD-10-CM | POA: Diagnosis not present

## 2016-06-11 DIAGNOSIS — Z992 Dependence on renal dialysis: Secondary | ICD-10-CM | POA: Diagnosis not present

## 2016-06-11 DIAGNOSIS — N186 End stage renal disease: Secondary | ICD-10-CM | POA: Diagnosis not present

## 2016-06-13 DIAGNOSIS — D510 Vitamin B12 deficiency anemia due to intrinsic factor deficiency: Secondary | ICD-10-CM | POA: Diagnosis not present

## 2016-06-13 DIAGNOSIS — Z992 Dependence on renal dialysis: Secondary | ICD-10-CM | POA: Diagnosis not present

## 2016-06-13 DIAGNOSIS — N186 End stage renal disease: Secondary | ICD-10-CM | POA: Diagnosis not present

## 2016-06-15 DIAGNOSIS — N186 End stage renal disease: Secondary | ICD-10-CM | POA: Diagnosis not present

## 2016-06-15 DIAGNOSIS — Z992 Dependence on renal dialysis: Secondary | ICD-10-CM | POA: Diagnosis not present

## 2016-06-16 DIAGNOSIS — N186 End stage renal disease: Secondary | ICD-10-CM | POA: Diagnosis not present

## 2016-06-16 DIAGNOSIS — Z992 Dependence on renal dialysis: Secondary | ICD-10-CM | POA: Diagnosis not present

## 2016-06-17 ENCOUNTER — Ambulatory Visit (INDEPENDENT_AMBULATORY_CARE_PROVIDER_SITE_OTHER): Payer: Medicare Other | Admitting: *Deleted

## 2016-06-17 DIAGNOSIS — I6340 Cerebral infarction due to embolism of unspecified cerebral artery: Secondary | ICD-10-CM | POA: Diagnosis not present

## 2016-06-18 DIAGNOSIS — N186 End stage renal disease: Secondary | ICD-10-CM | POA: Diagnosis not present

## 2016-06-18 DIAGNOSIS — Z992 Dependence on renal dialysis: Secondary | ICD-10-CM | POA: Diagnosis not present

## 2016-06-18 NOTE — Progress Notes (Signed)
[facility] Summary Report 

## 2016-06-20 DIAGNOSIS — Z992 Dependence on renal dialysis: Secondary | ICD-10-CM | POA: Diagnosis not present

## 2016-06-20 DIAGNOSIS — N186 End stage renal disease: Secondary | ICD-10-CM | POA: Diagnosis not present

## 2016-06-23 DIAGNOSIS — N186 End stage renal disease: Secondary | ICD-10-CM | POA: Diagnosis not present

## 2016-06-23 DIAGNOSIS — E119 Type 2 diabetes mellitus without complications: Secondary | ICD-10-CM | POA: Diagnosis not present

## 2016-06-23 DIAGNOSIS — Z992 Dependence on renal dialysis: Secondary | ICD-10-CM | POA: Diagnosis not present

## 2016-06-25 DIAGNOSIS — Z992 Dependence on renal dialysis: Secondary | ICD-10-CM | POA: Diagnosis not present

## 2016-06-25 DIAGNOSIS — N186 End stage renal disease: Secondary | ICD-10-CM | POA: Diagnosis not present

## 2016-06-25 LAB — CUP PACEART REMOTE DEVICE CHECK
Implantable Pulse Generator Implant Date: 20150205
MDC IDC SESS DTM: 20171130123727

## 2016-06-27 DIAGNOSIS — Z992 Dependence on renal dialysis: Secondary | ICD-10-CM | POA: Diagnosis not present

## 2016-06-27 DIAGNOSIS — N186 End stage renal disease: Secondary | ICD-10-CM | POA: Diagnosis not present

## 2016-06-30 DIAGNOSIS — N186 End stage renal disease: Secondary | ICD-10-CM | POA: Diagnosis not present

## 2016-06-30 DIAGNOSIS — Z992 Dependence on renal dialysis: Secondary | ICD-10-CM | POA: Diagnosis not present

## 2016-07-02 DIAGNOSIS — N186 End stage renal disease: Secondary | ICD-10-CM | POA: Diagnosis not present

## 2016-07-02 DIAGNOSIS — Z992 Dependence on renal dialysis: Secondary | ICD-10-CM | POA: Diagnosis not present

## 2016-07-04 DIAGNOSIS — N186 End stage renal disease: Secondary | ICD-10-CM | POA: Diagnosis not present

## 2016-07-04 DIAGNOSIS — Z992 Dependence on renal dialysis: Secondary | ICD-10-CM | POA: Diagnosis not present

## 2016-07-07 DIAGNOSIS — Z992 Dependence on renal dialysis: Secondary | ICD-10-CM | POA: Diagnosis not present

## 2016-07-07 DIAGNOSIS — N186 End stage renal disease: Secondary | ICD-10-CM | POA: Diagnosis not present

## 2016-07-08 DIAGNOSIS — L851 Acquired keratosis [keratoderma] palmaris et plantaris: Secondary | ICD-10-CM | POA: Diagnosis not present

## 2016-07-08 DIAGNOSIS — L603 Nail dystrophy: Secondary | ICD-10-CM | POA: Diagnosis not present

## 2016-07-08 DIAGNOSIS — E1142 Type 2 diabetes mellitus with diabetic polyneuropathy: Secondary | ICD-10-CM | POA: Diagnosis not present

## 2016-07-09 DIAGNOSIS — N186 End stage renal disease: Secondary | ICD-10-CM | POA: Diagnosis not present

## 2016-07-09 DIAGNOSIS — Z992 Dependence on renal dialysis: Secondary | ICD-10-CM | POA: Diagnosis not present

## 2016-07-11 DIAGNOSIS — Z992 Dependence on renal dialysis: Secondary | ICD-10-CM | POA: Diagnosis not present

## 2016-07-11 DIAGNOSIS — N186 End stage renal disease: Secondary | ICD-10-CM | POA: Diagnosis not present

## 2016-07-14 ENCOUNTER — Ambulatory Visit (INDEPENDENT_AMBULATORY_CARE_PROVIDER_SITE_OTHER): Payer: Medicare Other | Admitting: *Deleted

## 2016-07-14 DIAGNOSIS — N186 End stage renal disease: Secondary | ICD-10-CM | POA: Diagnosis not present

## 2016-07-14 DIAGNOSIS — I6340 Cerebral infarction due to embolism of unspecified cerebral artery: Secondary | ICD-10-CM | POA: Diagnosis not present

## 2016-07-14 DIAGNOSIS — Z992 Dependence on renal dialysis: Secondary | ICD-10-CM | POA: Diagnosis not present

## 2016-07-14 NOTE — Progress Notes (Signed)
Carelink Summary Report / Loop Recorder 

## 2016-07-16 DIAGNOSIS — N186 End stage renal disease: Secondary | ICD-10-CM | POA: Diagnosis not present

## 2016-07-16 DIAGNOSIS — Z992 Dependence on renal dialysis: Secondary | ICD-10-CM | POA: Diagnosis not present

## 2016-07-18 DIAGNOSIS — N186 End stage renal disease: Secondary | ICD-10-CM | POA: Diagnosis not present

## 2016-07-18 DIAGNOSIS — Z992 Dependence on renal dialysis: Secondary | ICD-10-CM | POA: Diagnosis not present

## 2016-07-21 DIAGNOSIS — N186 End stage renal disease: Secondary | ICD-10-CM | POA: Diagnosis not present

## 2016-07-21 DIAGNOSIS — Z992 Dependence on renal dialysis: Secondary | ICD-10-CM | POA: Diagnosis not present

## 2016-07-23 DIAGNOSIS — Z992 Dependence on renal dialysis: Secondary | ICD-10-CM | POA: Diagnosis not present

## 2016-07-23 DIAGNOSIS — N186 End stage renal disease: Secondary | ICD-10-CM | POA: Diagnosis not present

## 2016-07-25 DIAGNOSIS — Z992 Dependence on renal dialysis: Secondary | ICD-10-CM | POA: Diagnosis not present

## 2016-07-25 DIAGNOSIS — N186 End stage renal disease: Secondary | ICD-10-CM | POA: Diagnosis not present

## 2016-07-28 DIAGNOSIS — Z992 Dependence on renal dialysis: Secondary | ICD-10-CM | POA: Diagnosis not present

## 2016-07-28 DIAGNOSIS — N186 End stage renal disease: Secondary | ICD-10-CM | POA: Diagnosis not present

## 2016-07-29 DIAGNOSIS — Z0000 Encounter for general adult medical examination without abnormal findings: Secondary | ICD-10-CM | POA: Diagnosis not present

## 2016-07-29 DIAGNOSIS — I10 Essential (primary) hypertension: Secondary | ICD-10-CM | POA: Diagnosis not present

## 2016-07-29 DIAGNOSIS — E1122 Type 2 diabetes mellitus with diabetic chronic kidney disease: Secondary | ICD-10-CM | POA: Diagnosis not present

## 2016-07-29 DIAGNOSIS — D510 Vitamin B12 deficiency anemia due to intrinsic factor deficiency: Secondary | ICD-10-CM | POA: Diagnosis not present

## 2016-07-30 DIAGNOSIS — Z992 Dependence on renal dialysis: Secondary | ICD-10-CM | POA: Diagnosis not present

## 2016-07-30 DIAGNOSIS — N186 End stage renal disease: Secondary | ICD-10-CM | POA: Diagnosis not present

## 2016-08-01 DIAGNOSIS — N186 End stage renal disease: Secondary | ICD-10-CM | POA: Diagnosis not present

## 2016-08-01 DIAGNOSIS — Z992 Dependence on renal dialysis: Secondary | ICD-10-CM | POA: Diagnosis not present

## 2016-08-02 LAB — CUP PACEART REMOTE DEVICE CHECK
Implantable Pulse Generator Implant Date: 20150205
MDC IDC SESS DTM: 20171230130641

## 2016-08-02 NOTE — Progress Notes (Addendum)
[employer] summary report received. Battery status RRT on 05/25/16. Normal device function. No new symptom episodes, tachy episodes, brady, or pause episodes. No new AF episodes. Monthly summary reports and ROV/PRN

## 2016-08-04 DIAGNOSIS — Z992 Dependence on renal dialysis: Secondary | ICD-10-CM | POA: Diagnosis not present

## 2016-08-04 DIAGNOSIS — N186 End stage renal disease: Secondary | ICD-10-CM | POA: Diagnosis not present

## 2016-08-06 DIAGNOSIS — N186 End stage renal disease: Secondary | ICD-10-CM | POA: Diagnosis not present

## 2016-08-06 DIAGNOSIS — Z992 Dependence on renal dialysis: Secondary | ICD-10-CM | POA: Diagnosis not present

## 2016-08-07 ENCOUNTER — Telehealth: Payer: Self-pay | Admitting: Cardiology

## 2016-08-07 NOTE — Telephone Encounter (Signed)
Spoke w/ pt daughter and requested that pt send a remote transmission w/ her home monitor b/c it has not updated in the last 14 days. Pt daughter will call back later for help sending transmission.

## 2016-08-08 ENCOUNTER — Telehealth: Payer: Self-pay | Admitting: *Deleted

## 2016-08-08 DIAGNOSIS — N186 End stage renal disease: Secondary | ICD-10-CM | POA: Diagnosis not present

## 2016-08-08 DIAGNOSIS — Z992 Dependence on renal dialysis: Secondary | ICD-10-CM | POA: Diagnosis not present

## 2016-08-08 LAB — CUP PACEART REMOTE DEVICE CHECK
MDC IDC PG IMPLANT DT: 20150205
MDC IDC SESS DTM: 20180129132944

## 2016-08-08 NOTE — Telephone Encounter (Signed)
[facility] requesting call back.  [facility] phone number.  LINQ at RRT since 05/25/16.  Will offer appointment with Dr. Lovena Le if patient wishes to discuss explant.  Will order [employer] monitor return kit and unenroll from [employer] once patient confirms mailing address.

## 2016-08-11 ENCOUNTER — Encounter: Payer: Self-pay | Admitting: Cardiology

## 2016-08-11 DIAGNOSIS — Z992 Dependence on renal dialysis: Secondary | ICD-10-CM | POA: Diagnosis not present

## 2016-08-11 DIAGNOSIS — N186 End stage renal disease: Secondary | ICD-10-CM | POA: Diagnosis not present

## 2016-08-11 NOTE — Telephone Encounter (Signed)
Spoke w/ pt daughter and she requested that I send instructions about home monitor in mail. Informed her that I would do that today. Pt daughter verbalized understanding.

## 2016-08-13 DIAGNOSIS — N186 End stage renal disease: Secondary | ICD-10-CM | POA: Diagnosis not present

## 2016-08-13 DIAGNOSIS — Z992 Dependence on renal dialysis: Secondary | ICD-10-CM | POA: Diagnosis not present

## 2016-08-15 DIAGNOSIS — Z992 Dependence on renal dialysis: Secondary | ICD-10-CM | POA: Diagnosis not present

## 2016-08-15 DIAGNOSIS — N186 End stage renal disease: Secondary | ICD-10-CM | POA: Diagnosis not present

## 2016-08-18 DIAGNOSIS — N186 End stage renal disease: Secondary | ICD-10-CM | POA: Diagnosis not present

## 2016-08-18 DIAGNOSIS — Z992 Dependence on renal dialysis: Secondary | ICD-10-CM | POA: Diagnosis not present

## 2016-08-19 NOTE — Telephone Encounter (Signed)
LMOM for patient's daughter, gave [facility] phone number for return call.    Able to reach patient.  She verbalizes understanding that her [place]ondon has reached EOS and that she should receive a prepaid return kit in the mail.  Patient is unsure if she wishes to discuss explant.  She states her daughter usually handles these discussions.  Advised that I left a message on her daughter's VM with my direct number if she wishes to schedule an appointment to discuss plan.  Patient verbalizes understanding and denies additional questions or concerns at this time.

## 2016-08-19 NOTE — Telephone Encounter (Signed)
Patient's daughter returned call.  She agrees to discuss options with patient regarding f/u for LINQ at [facility].  She will plan to call back tomorrow with her mother's decision.

## 2016-08-20 DIAGNOSIS — N186 End stage renal disease: Secondary | ICD-10-CM | POA: Diagnosis not present

## 2016-08-20 DIAGNOSIS — Z992 Dependence on renal dialysis: Secondary | ICD-10-CM | POA: Diagnosis not present

## 2016-08-22 DIAGNOSIS — N186 End stage renal disease: Secondary | ICD-10-CM | POA: Diagnosis not present

## 2016-08-22 DIAGNOSIS — Z992 Dependence on renal dialysis: Secondary | ICD-10-CM | POA: Diagnosis not present

## 2016-08-25 DIAGNOSIS — N186 End stage renal disease: Secondary | ICD-10-CM | POA: Diagnosis not present

## 2016-08-25 DIAGNOSIS — Z992 Dependence on renal dialysis: Secondary | ICD-10-CM | POA: Diagnosis not present

## 2016-08-27 DIAGNOSIS — Z992 Dependence on renal dialysis: Secondary | ICD-10-CM | POA: Diagnosis not present

## 2016-08-27 DIAGNOSIS — N186 End stage renal disease: Secondary | ICD-10-CM | POA: Diagnosis not present

## 2016-08-29 DIAGNOSIS — N186 End stage renal disease: Secondary | ICD-10-CM | POA: Diagnosis not present

## 2016-08-29 DIAGNOSIS — Z992 Dependence on renal dialysis: Secondary | ICD-10-CM | POA: Diagnosis not present

## 2016-09-01 DIAGNOSIS — N186 End stage renal disease: Secondary | ICD-10-CM | POA: Diagnosis not present

## 2016-09-01 DIAGNOSIS — Z992 Dependence on renal dialysis: Secondary | ICD-10-CM | POA: Diagnosis not present

## 2016-09-03 DIAGNOSIS — N186 End stage renal disease: Secondary | ICD-10-CM | POA: Diagnosis not present

## 2016-09-03 DIAGNOSIS — Z992 Dependence on renal dialysis: Secondary | ICD-10-CM | POA: Diagnosis not present

## 2016-09-05 DIAGNOSIS — N186 End stage renal disease: Secondary | ICD-10-CM | POA: Diagnosis not present

## 2016-09-05 DIAGNOSIS — Z992 Dependence on renal dialysis: Secondary | ICD-10-CM | POA: Diagnosis not present

## 2016-09-08 DIAGNOSIS — Z992 Dependence on renal dialysis: Secondary | ICD-10-CM | POA: Diagnosis not present

## 2016-09-08 DIAGNOSIS — N186 End stage renal disease: Secondary | ICD-10-CM | POA: Diagnosis not present

## 2016-09-10 DIAGNOSIS — Z992 Dependence on renal dialysis: Secondary | ICD-10-CM | POA: Diagnosis not present

## 2016-09-10 DIAGNOSIS — N186 End stage renal disease: Secondary | ICD-10-CM | POA: Diagnosis not present

## 2016-09-12 DIAGNOSIS — N186 End stage renal disease: Secondary | ICD-10-CM | POA: Diagnosis not present

## 2016-09-12 DIAGNOSIS — Z992 Dependence on renal dialysis: Secondary | ICD-10-CM | POA: Diagnosis not present

## 2016-09-13 DIAGNOSIS — Z992 Dependence on renal dialysis: Secondary | ICD-10-CM | POA: Diagnosis not present

## 2016-09-13 DIAGNOSIS — N186 End stage renal disease: Secondary | ICD-10-CM | POA: Diagnosis not present

## 2016-09-15 DIAGNOSIS — N186 End stage renal disease: Secondary | ICD-10-CM | POA: Diagnosis not present

## 2016-09-15 DIAGNOSIS — Z992 Dependence on renal dialysis: Secondary | ICD-10-CM | POA: Diagnosis not present

## 2016-09-17 DIAGNOSIS — N186 End stage renal disease: Secondary | ICD-10-CM | POA: Diagnosis not present

## 2016-09-17 DIAGNOSIS — Z992 Dependence on renal dialysis: Secondary | ICD-10-CM | POA: Diagnosis not present

## 2016-09-19 DIAGNOSIS — Z992 Dependence on renal dialysis: Secondary | ICD-10-CM | POA: Diagnosis not present

## 2016-09-19 DIAGNOSIS — N186 End stage renal disease: Secondary | ICD-10-CM | POA: Diagnosis not present

## 2016-09-22 DIAGNOSIS — N186 End stage renal disease: Secondary | ICD-10-CM | POA: Diagnosis not present

## 2016-09-22 DIAGNOSIS — Z992 Dependence on renal dialysis: Secondary | ICD-10-CM | POA: Diagnosis not present

## 2016-09-22 DIAGNOSIS — E119 Type 2 diabetes mellitus without complications: Secondary | ICD-10-CM | POA: Diagnosis not present

## 2016-09-24 DIAGNOSIS — Z992 Dependence on renal dialysis: Secondary | ICD-10-CM | POA: Diagnosis not present

## 2016-09-24 DIAGNOSIS — N186 End stage renal disease: Secondary | ICD-10-CM | POA: Diagnosis not present

## 2016-09-26 DIAGNOSIS — Z992 Dependence on renal dialysis: Secondary | ICD-10-CM | POA: Diagnosis not present

## 2016-09-26 DIAGNOSIS — N186 End stage renal disease: Secondary | ICD-10-CM | POA: Diagnosis not present

## 2016-09-29 DIAGNOSIS — N186 End stage renal disease: Secondary | ICD-10-CM | POA: Diagnosis not present

## 2016-09-29 DIAGNOSIS — Z992 Dependence on renal dialysis: Secondary | ICD-10-CM | POA: Diagnosis not present

## 2016-10-01 DIAGNOSIS — N186 End stage renal disease: Secondary | ICD-10-CM | POA: Diagnosis not present

## 2016-10-01 DIAGNOSIS — Z992 Dependence on renal dialysis: Secondary | ICD-10-CM | POA: Diagnosis not present

## 2016-10-02 ENCOUNTER — Other Ambulatory Visit: Payer: Self-pay | Admitting: Internal Medicine

## 2016-10-03 DIAGNOSIS — N186 End stage renal disease: Secondary | ICD-10-CM | POA: Diagnosis not present

## 2016-10-03 DIAGNOSIS — Z992 Dependence on renal dialysis: Secondary | ICD-10-CM | POA: Diagnosis not present

## 2016-10-06 DIAGNOSIS — Z992 Dependence on renal dialysis: Secondary | ICD-10-CM | POA: Diagnosis not present

## 2016-10-06 DIAGNOSIS — N186 End stage renal disease: Secondary | ICD-10-CM | POA: Diagnosis not present

## 2016-10-08 DIAGNOSIS — D510 Vitamin B12 deficiency anemia due to intrinsic factor deficiency: Secondary | ICD-10-CM | POA: Diagnosis not present

## 2016-10-08 DIAGNOSIS — N186 End stage renal disease: Secondary | ICD-10-CM | POA: Diagnosis not present

## 2016-10-08 DIAGNOSIS — Z992 Dependence on renal dialysis: Secondary | ICD-10-CM | POA: Diagnosis not present

## 2016-10-10 DIAGNOSIS — Z992 Dependence on renal dialysis: Secondary | ICD-10-CM | POA: Diagnosis not present

## 2016-10-10 DIAGNOSIS — N186 End stage renal disease: Secondary | ICD-10-CM | POA: Diagnosis not present

## 2016-10-13 DIAGNOSIS — N186 End stage renal disease: Secondary | ICD-10-CM | POA: Diagnosis not present

## 2016-10-13 DIAGNOSIS — Z992 Dependence on renal dialysis: Secondary | ICD-10-CM | POA: Diagnosis not present

## 2016-10-15 DIAGNOSIS — N186 End stage renal disease: Secondary | ICD-10-CM | POA: Diagnosis not present

## 2016-10-15 DIAGNOSIS — Z992 Dependence on renal dialysis: Secondary | ICD-10-CM | POA: Diagnosis not present

## 2016-10-17 DIAGNOSIS — N186 End stage renal disease: Secondary | ICD-10-CM | POA: Diagnosis not present

## 2016-10-17 DIAGNOSIS — Z992 Dependence on renal dialysis: Secondary | ICD-10-CM | POA: Diagnosis not present

## 2016-10-20 DIAGNOSIS — Z992 Dependence on renal dialysis: Secondary | ICD-10-CM | POA: Diagnosis not present

## 2016-10-20 DIAGNOSIS — N186 End stage renal disease: Secondary | ICD-10-CM | POA: Diagnosis not present

## 2016-10-21 DIAGNOSIS — L851 Acquired keratosis [keratoderma] palmaris et plantaris: Secondary | ICD-10-CM | POA: Diagnosis not present

## 2016-10-21 DIAGNOSIS — E1142 Type 2 diabetes mellitus with diabetic polyneuropathy: Secondary | ICD-10-CM | POA: Diagnosis not present

## 2016-10-21 DIAGNOSIS — L603 Nail dystrophy: Secondary | ICD-10-CM | POA: Diagnosis not present

## 2016-10-22 DIAGNOSIS — Z992 Dependence on renal dialysis: Secondary | ICD-10-CM | POA: Diagnosis not present

## 2016-10-22 DIAGNOSIS — N186 End stage renal disease: Secondary | ICD-10-CM | POA: Diagnosis not present

## 2016-10-24 DIAGNOSIS — Z992 Dependence on renal dialysis: Secondary | ICD-10-CM | POA: Diagnosis not present

## 2016-10-24 DIAGNOSIS — N186 End stage renal disease: Secondary | ICD-10-CM | POA: Diagnosis not present

## 2016-10-27 DIAGNOSIS — N186 End stage renal disease: Secondary | ICD-10-CM | POA: Diagnosis not present

## 2016-10-27 DIAGNOSIS — Z992 Dependence on renal dialysis: Secondary | ICD-10-CM | POA: Diagnosis not present

## 2016-10-28 DIAGNOSIS — E1122 Type 2 diabetes mellitus with diabetic chronic kidney disease: Secondary | ICD-10-CM | POA: Diagnosis not present

## 2016-10-28 DIAGNOSIS — I10 Essential (primary) hypertension: Secondary | ICD-10-CM | POA: Diagnosis not present

## 2016-10-28 DIAGNOSIS — N186 End stage renal disease: Secondary | ICD-10-CM | POA: Diagnosis not present

## 2016-10-29 DIAGNOSIS — Z992 Dependence on renal dialysis: Secondary | ICD-10-CM | POA: Diagnosis not present

## 2016-10-29 DIAGNOSIS — N186 End stage renal disease: Secondary | ICD-10-CM | POA: Diagnosis not present

## 2016-10-31 DIAGNOSIS — Z992 Dependence on renal dialysis: Secondary | ICD-10-CM | POA: Diagnosis not present

## 2016-10-31 DIAGNOSIS — N186 End stage renal disease: Secondary | ICD-10-CM | POA: Diagnosis not present

## 2016-11-03 DIAGNOSIS — Z992 Dependence on renal dialysis: Secondary | ICD-10-CM | POA: Diagnosis not present

## 2016-11-03 DIAGNOSIS — N186 End stage renal disease: Secondary | ICD-10-CM | POA: Diagnosis not present

## 2016-11-05 DIAGNOSIS — Z992 Dependence on renal dialysis: Secondary | ICD-10-CM | POA: Diagnosis not present

## 2016-11-05 DIAGNOSIS — N186 End stage renal disease: Secondary | ICD-10-CM | POA: Diagnosis not present

## 2016-11-07 DIAGNOSIS — N186 End stage renal disease: Secondary | ICD-10-CM | POA: Diagnosis not present

## 2016-11-07 DIAGNOSIS — Z992 Dependence on renal dialysis: Secondary | ICD-10-CM | POA: Diagnosis not present

## 2016-11-10 DIAGNOSIS — Z992 Dependence on renal dialysis: Secondary | ICD-10-CM | POA: Diagnosis not present

## 2016-11-10 DIAGNOSIS — N186 End stage renal disease: Secondary | ICD-10-CM | POA: Diagnosis not present

## 2016-11-12 DIAGNOSIS — Z992 Dependence on renal dialysis: Secondary | ICD-10-CM | POA: Diagnosis not present

## 2016-11-12 DIAGNOSIS — N186 End stage renal disease: Secondary | ICD-10-CM | POA: Diagnosis not present

## 2016-11-13 DIAGNOSIS — Z992 Dependence on renal dialysis: Secondary | ICD-10-CM | POA: Diagnosis not present

## 2016-11-13 DIAGNOSIS — N186 End stage renal disease: Secondary | ICD-10-CM | POA: Diagnosis not present

## 2016-11-14 DIAGNOSIS — N186 End stage renal disease: Secondary | ICD-10-CM | POA: Diagnosis not present

## 2016-11-14 DIAGNOSIS — Z992 Dependence on renal dialysis: Secondary | ICD-10-CM | POA: Diagnosis not present

## 2016-11-17 DIAGNOSIS — Z992 Dependence on renal dialysis: Secondary | ICD-10-CM | POA: Diagnosis not present

## 2016-11-17 DIAGNOSIS — N186 End stage renal disease: Secondary | ICD-10-CM | POA: Diagnosis not present

## 2016-11-19 ENCOUNTER — Encounter (HOSPITAL_COMMUNITY): Payer: Self-pay | Admitting: Emergency Medicine

## 2016-11-19 DIAGNOSIS — K5780 Diverticulitis of intestine, part unspecified, with perforation and abscess without bleeding: Secondary | ICD-10-CM | POA: Diagnosis not present

## 2016-11-19 DIAGNOSIS — E119 Type 2 diabetes mellitus without complications: Secondary | ICD-10-CM | POA: Insufficient documentation

## 2016-11-19 DIAGNOSIS — R1084 Generalized abdominal pain: Secondary | ICD-10-CM | POA: Diagnosis present

## 2016-11-19 DIAGNOSIS — Z79899 Other long term (current) drug therapy: Secondary | ICD-10-CM | POA: Insufficient documentation

## 2016-11-19 DIAGNOSIS — Z8673 Personal history of transient ischemic attack (TIA), and cerebral infarction without residual deficits: Secondary | ICD-10-CM | POA: Insufficient documentation

## 2016-11-19 DIAGNOSIS — I120 Hypertensive chronic kidney disease with stage 5 chronic kidney disease or end stage renal disease: Secondary | ICD-10-CM | POA: Insufficient documentation

## 2016-11-19 DIAGNOSIS — Z794 Long term (current) use of insulin: Secondary | ICD-10-CM | POA: Insufficient documentation

## 2016-11-19 DIAGNOSIS — N186 End stage renal disease: Secondary | ICD-10-CM | POA: Insufficient documentation

## 2016-11-19 DIAGNOSIS — K5732 Diverticulitis of large intestine without perforation or abscess without bleeding: Secondary | ICD-10-CM | POA: Insufficient documentation

## 2016-11-19 DIAGNOSIS — Z992 Dependence on renal dialysis: Secondary | ICD-10-CM | POA: Diagnosis not present

## 2016-11-19 DIAGNOSIS — R109 Unspecified abdominal pain: Secondary | ICD-10-CM | POA: Diagnosis not present

## 2016-11-19 NOTE — ED Triage Notes (Signed)
Pt to ED from home c/o L flank pain that radiates down L side to L side of groin since 11am today, just after dialysis. Pt reports she rarely produces urine, but does sometimes. Denies N/V/D, last BM this morning and normal. Denies new urinary symptoms/fevers/chills. MWF dialysis.

## 2016-11-20 ENCOUNTER — Emergency Department (HOSPITAL_COMMUNITY): Payer: Medicare Other

## 2016-11-20 ENCOUNTER — Emergency Department (HOSPITAL_COMMUNITY)
Admission: EM | Admit: 2016-11-20 | Discharge: 2016-11-20 | Disposition: A | Discharge status: Home / Self Care | Payer: Medicare Other | Attending: Emergency Medicine | Admitting: Emergency Medicine

## 2016-11-20 DIAGNOSIS — K5792 Diverticulitis of intestine, part unspecified, without perforation or abscess without bleeding: Secondary | ICD-10-CM

## 2016-11-20 DIAGNOSIS — R109 Unspecified abdominal pain: Secondary | ICD-10-CM | POA: Diagnosis not present

## 2016-11-20 LAB — COMPREHENSIVE METABOLIC PANEL
ALT: 15 U/L (ref 14–54)
ANION GAP: 10 (ref 5–15)
AST: 17 U/L (ref 15–41)
Albumin: 3.2 g/dL — ABNORMAL LOW (ref 3.5–5.0)
Alkaline Phosphatase: 60 U/L (ref 38–126)
BUN: 12 mg/dL (ref 6–20)
CHLORIDE: 97 mmol/L — AB (ref 101–111)
CO2: 30 mmol/L (ref 22–32)
Calcium: 8.4 mg/dL — ABNORMAL LOW (ref 8.9–10.3)
Creatinine, Ser: 5.33 mg/dL — ABNORMAL HIGH (ref 0.44–1.00)
GFR, EST AFRICAN AMERICAN: 8 mL/min — AB (ref >60)
GFR, EST NON AFRICAN AMERICAN: 7 mL/min — AB (ref >60)
Glucose, Bld: 200 mg/dL — ABNORMAL HIGH (ref 65–99)
POTASSIUM: 4.3 mmol/L (ref 3.5–5.1)
Sodium: 137 mmol/L (ref 135–145)
Total Bilirubin: 0.8 mg/dL (ref 0.3–1.2)
Total Protein: 7.3 g/dL (ref 6.5–8.1)

## 2016-11-20 LAB — CBC
HEMATOCRIT: 34.7 % — AB (ref 36.0–46.0)
Hemoglobin: 10.8 g/dL — ABNORMAL LOW (ref 12.0–15.0)
MCH: 30.9 pg (ref 26.0–34.0)
MCHC: 31.1 g/dL (ref 30.0–36.0)
MCV: 99.1 fL (ref 78.0–100.0)
PLATELETS: 178 10*3/uL (ref 150–400)
RBC: 3.5 MIL/uL — AB (ref 3.87–5.11)
RDW: 15.2 % (ref 11.5–15.5)
WBC: 13 10*3/uL — AB (ref 4.0–10.5)

## 2016-11-20 LAB — LIPASE, BLOOD: LIPASE: 25 U/L (ref 11–51)

## 2016-11-20 MED ORDER — METRONIDAZOLE 500 MG PO TABS: 500.0000 mg | ORAL_TABLET | Freq: Three times a day (TID) | ORAL | 0 refills | Status: DC

## 2016-11-20 MED ORDER — CIPROFLOXACIN IN D5W 400 MG/200ML IV SOLN
400.0000 mg | Freq: Once | INTRAVENOUS | Status: AC
  Administered 2016-11-20: 400 mg via INTRAVENOUS
  Filled 2016-11-20: qty 200

## 2016-11-20 MED ORDER — TRAMADOL HCL 50 MG PO TABS: 50.0000 mg | ORAL_TABLET | Freq: Four times a day (QID) | ORAL | 0 refills | Status: DC | PRN

## 2016-11-20 MED ORDER — METRONIDAZOLE 500 MG PO TABS
500.0000 mg | ORAL_TABLET | Freq: Once | ORAL | Status: AC
  Administered 2016-11-20: 500 mg via ORAL
  Filled 2016-11-20: qty 1

## 2016-11-20 MED ORDER — CIPROFLOXACIN HCL 500 MG PO TABS: 500.0000 mg | ORAL_TABLET | Freq: Every day | ORAL | 0 refills | Status: DC

## 2016-11-20 NOTE — ED Notes (Signed)
Pt c/o burning at IV site.  Line flushed and rate of cipro changed to 75 ml/hr.  Pt still c/o burning.  Cipro paused.

## 2016-11-20 NOTE — ED Provider Notes (Signed)
[facility] DEPT Provider Note   CSN: [id number] Arrival date & time: 11/19/16  2312   By signing my name below, I, Eunice Blase, attest that this documentation has been prepared under the direction and in the presence of Pollina, Gwenyth Allegra, MD. Electronically signed, Eunice Blase, ED Scribe. 11/20/16. 3:50 AM.   History   Chief Complaint Chief Complaint  Patient presents with  . Flank Pain   The history is provided by the patient and medical records. No language interpreter was used.    [name] is a 76 y.o. 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 She notes decreased urination and urgency as well. Pain described as 8/10 LUQ pain radiating to the LLQ and L groin areas worse with pressure to the area. No h/o kidney stones or infection. No known h/o pancreatitis or diverticulitis noted. No other complaints at this time.   Past Medical History:  Diagnosis Date  . Anemia   . Cataract   . Chronic kidney disease   . CVA (cerebral infarction)   . Diabetes mellitus    Type 2  . Diabetes mellitus without complication ([place])   . Dialysis patient ([place])   . Dialysis patient ([place])    M, W, F  . Fistula    R arm  . GERD (gastroesophageal reflux disease)   . Hypertension   . Renal disorder   . Shortness of breath   . Stroke [facility])    right side weakness    Patient Active Problem List   Diagnosis Date Noted  . DM type 2, goal A1c below 7   . HCAP (healthcare-associated pneumonia) 05/29/2015  . Hyperkalemia 05/29/2015  . Diabetes mellitus with complication ([place])   . ESRD (end stage renal disease) on dialysis ([place])   . Encounter for adequacy testing for hemodialysis ([place]) 01/17/2014  . Mechanical complication of other vascular device, implant, and graft 01/17/2014  . Disturbances of vision, late effect of stroke 11/03/2013  .  Right hemiparesis ([place]) 08/19/2013  . CVA (cerebral infarction) 07/20/2013  . Gastroparesis due to DM ([place]) 07/01/2013  . End stage renal disease ([place]) 06/25/2013  . Anemia 01/26/2013  . DM W/NEURO MNFST, TYPE II, UNCONTROLLED 11/19/2006  . Leukocytosis 11/19/2006  . DEPENDENT EDEMA, LEGS, BILATERAL 11/19/2006  . SINUS TACHYCARDIA 10/16/2006  . Diabetes mellitus, type 2 ([place]) 05/20/2006  . HYPERLIPIDEMIA 05/20/2006  . SYNDROME, RESTLESS LEGS 05/20/2006  . PERIPHERAL NEUROPATHY 05/20/2006  . Hypertension 05/20/2006    Past Surgical History:  Procedure Laterality Date  . AV FISTULA PLACEMENT Right 09/08/2013   Procedure: CREATION OF RIGHT BRACHIAL CEPHALIC ARTERIOVENOUS FISTULA ;  Surgeon: Mal Misty, MD;  Location: [place];  Service: Vascular;  Laterality: Right;  . BASCILIC VEIN TRANSPOSITION Right 01/26/2014   Procedure: Right Arm BASILIC VEIN TRANSPOSITION;  Surgeon: Mal Misty, MD;  Location: [place];  Service: Vascular;  Laterality: Right;  . CATARACT EXTRACTION W/PHACO  11/20/2011   Procedure: CATARACT EXTRACTION PHACO AND INTRAOCULAR LENS PLACEMENT (IOC);  Surgeon: Tonny Branch, MD;  Location: [facility];  Service: Ophthalmology;  Laterality: Right;  CDE 18.82  . CATARACT EXTRACTION W/PHACO Left 11/18/2012   Procedure: CATARACT EXTRACTION PHACO AND INTRAOCULAR LENS PLACEMENT (IOC);  Surgeon: Tonny Branch, MD;  Location: [facility];  Service: Ophthalmology;  Laterality: Left;  CDE: 18.97  . COLONOSCOPY N/A 02/09/2013   Procedure: COLONOSCOPY;  Surgeon:  Rogene Houston, MD;  [facility];  Service: Endoscopy;  Laterality: N/A;  305-moved to 220 Ann to notify pt  . INSERTION OF DIALYSIS CATHETER Right 06/24/2013   Procedure: INSERTION OF DIALYSIS CATHETER: Ultrasound guided;  Surgeon: Serafina Mitchell, MD;  Location: [place];  Service: Vascular;  Laterality: Right;  . LIGATION OF ARTERIOVENOUS  FISTULA Right 01/26/2014   Procedure: LIGATION OF ARTERIOVENOUS  FISTULA;  Surgeon: Mal Misty,  MD;  Location: [place];  Service: Vascular;  Laterality: Right;  . 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 LOOP RECORDER IMPLANT N/A 07/21/2013   Procedure: LOOP RECORDER IMPLANT;  Surgeon: Evans Lance, MD;  Location: [facility] CATH LAB;  Service: Cardiovascular;  Laterality: N/A;  . TEE WITHOUT CARDIOVERSION N/A 07/21/2013   Procedure: TRANSESOPHAGEAL ECHOCARDIOGRAM (TEE);  Surgeon: Dorothy Spark, MD;  Location: [facility] ENDOSCOPY;  Service: Cardiovascular;  Laterality: N/A;    OB History    Gravida Para Term Preterm AB Living   0 0 0 0 0     SAB TAB Ectopic Multiple Live Births   0 0 0           Home Medications    Prior to Admission medications   Medication Sig Start Date End Date Taking? Authorizing Provider  albuterol (PROVENTIL HFA;VENTOLIN HFA) 108 (90 BASE) MCG/ACT inhaler Inhale 1-2 puffs into the lungs every 6 (six) hours as needed for wheezing. Use 2 puffs 3 times daily x 4 days then as needed. 06/03/15   Eugenie Filler, MD  amoxicillin-clavulanate (AUGMENTIN) 500-125 MG tablet Take 1 tablet (500 mg total) by mouth daily. Take for 4 days then stop. Patient not taking: Reported on 04/08/2016 06/03/15   Eugenie Filler, MD  aspirin 81 MG chewable tablet Chew 81 mg by mouth daily.    [provider]  atorvastatin (LIPITOR) 20 MG tablet Take 1 tablet by mouth daily. 11/09/14   [provider]  cyanocobalamin (,VITAMIN B-12,) 1000 MCG/ML injection Inject 1 mL into the muscle every 30 (thirty) days. 11/01/14   [provider]  fluticasone (FLONASE) 50 MCG/ACT nasal spray Place 2 sprays into both nostrils daily. Use for 5 days then as needed. Patient not taking: Reported on 04/08/2016 06/03/15   Eugenie Filler, MD  guaiFENesin (MUCINEX) 600 MG 12 hr tablet Take 2 tablets (1,200 mg total) by mouth 2 (two) times daily. Take for 5 days then stop. Patient not taking: Reported on 04/08/2016 06/03/15   Eugenie Filler, MD  ibuprofen (ADVIL,MOTRIN) 200 MG tablet Take 200-400 mg by mouth every 6 (six) hours as needed for moderate pain.     [provider]  insulin NPH Human (HUMULIN N,NOVOLIN N) 100 UNIT/ML injection Inject 10 Units into the skin 2 (two) times daily before a meal.    [provider]  labetalol (NORMODYNE) 300 MG tablet Take 300 mg by mouth 2 (two) times daily. 11/06/14   [provider]  meclizine (ANTIVERT) 25 MG tablet Take 1 tablet by mouth every 6 (six) hours as needed for dizziness.  11/07/14   [provider]  multivitamin (RENA-VIT) TABS tablet Take 1 tablet by mouth at bedtime. 08/21/14   Kirsteins, Luanna Salk, MD  predniSONE (DELTASONE) 20 MG tablet Take 1-3 tablets (20-60 mg total) by mouth daily before breakfast. Take 3 tablets (60mg ) daily x 1 day, then 2 tablets (40mg )daily x 3 days, then 1 tablet (  20mg )daily x 3 days then stop. Patient not taking: Reported on 04/08/2016 06/03/15   Eugenie Filler, MD    Family History Family History  Problem Relation Age of Onset  . Cancer Sister   . Cancer Brother   . Anesthesia problems Neg Hx   . Hypotension Neg Hx   . Malignant hyperthermia Neg Hx   . Pseudochol deficiency Neg Hx     Social History Social History  Substance Use Topics  . Smoking status: Never Smoker  . Smokeless tobacco: Never Used  . Alcohol use No     Allergies   Ambien [zolpidem]; Reglan [metoclopramide]; and Reglan [metoclopramide]   Review of Systems Review of Systems  Constitutional: Negative for chills and fever.  Gastrointestinal: Positive for abdominal pain. Negative for diarrhea, nausea and vomiting.  Genitourinary: Positive for decreased urine volume and urgency. Negative for flank pain.  All other systems reviewed and are negative.    Physical Exam Updated Vital Signs BP (!) 155/60 (BP Location: Right Arm)   Pulse 78   Temp 99 F (37.2 C) (Oral)   Resp (!) 165   Ht 5\' 7"  (1.702 m)   Wt 183 lb  6.8 oz (83.2 kg)   SpO2 97%   BMI 28.73 kg/m   Physical Exam  Constitutional: She is oriented to person, place, and time. She appears well-developed and well-nourished. No distress.  HENT:  Head: Normocephalic and atraumatic.  Right Ear: Hearing normal.  Left Ear: Hearing normal.  Nose: Nose normal.  Mouth/Throat: Oropharynx is clear and moist and mucous membranes are normal.  Eyes: Conjunctivae and EOM are normal. Pupils are equal, round, and reactive to light.  Neck: Normal range of motion. Neck supple.  Cardiovascular: Regular rhythm, S1 normal and S2 normal.  Exam reveals no gallop and no friction rub.   No murmur heard. Pulmonary/Chest: Effort normal and breath sounds normal. No respiratory distress. She exhibits no tenderness.  Abdominal: Soft. Normal appearance and bowel sounds are normal. There is no hepatosplenomegaly. There is tenderness in the left upper quadrant and left lower quadrant. There is no rebound, no guarding, no tenderness at McBurney's point and negative Murphy's sign. No hernia.  Musculoskeletal: Normal range of motion.  Neurological: She is alert and oriented to person, place, and time. She has normal strength. No cranial nerve deficit or sensory deficit. Coordination normal. GCS eye subscore is 4. GCS verbal subscore is 5. GCS motor subscore is 6.  Skin: Skin is warm, dry and intact. No rash noted. No cyanosis.  Psychiatric: She has a normal mood and affect. Her speech is normal and behavior is normal. Thought content normal.  Nursing note and vitals reviewed.    ED Treatments / Results  DIAGNOSTIC STUDIES: Oxygen Saturation is 97% on RA, NL by my interpretation.    COORDINATION OF CARE: 3:48 AM-Discussed next steps with pt. Pt verbalized understanding and is agreeable with the plan. Will order imaging.   Labs (all labs ordered are listed, but only abnormal results are displayed) Labs Reviewed  COMPREHENSIVE METABOLIC PANEL - Abnormal; Notable for the  following:       Result Value   Chloride 97 (*)    Glucose, Bld 200 (*)    Creatinine, Ser 5.33 (*)    Calcium 8.4 (*)    Albumin 3.2 (*)    GFR calc non Af Amer 7 (*)    GFR calc Af Amer 8 (*)    All other components within normal limits  CBC -  Abnormal; Notable for the following:    WBC 13.0 (*)    RBC 3.50 (*)    Hemoglobin 10.8 (*)    HCT 34.7 (*)    All other components within normal limits  LIPASE, BLOOD  URINALYSIS, ROUTINE W REFLEX MICROSCOPIC    EKG  EKG Interpretation None       Radiology Ct Renal Stone Study  Result Date: 11/20/2016 CLINICAL DATA:  Initial evaluation for acute left flank pain. EXAM: CT ABDOMEN AND PELVIS WITHOUT CONTRAST TECHNIQUE: Multidetector CT imaging of the abdomen and pelvis was performed following the standard protocol without IV contrast. COMPARISON:  Prior CT from 11/19/2014. FINDINGS: Lower chest: Scattered atelectatic changes present within the visualized lung bases. Calcified granuloma present the left lung base. Visualized lung bases are otherwise clear. Cardiomegaly partially visualized. Hepatobiliary: Limited noncontrast evaluation liver is unremarkable. Cholelithiasis without imaging findings to suggest acute cholecystitis. No biliary dilatation. Pancreas: Pancreas within normal limits. Spleen: Spleen within normal limits. Adrenals/Urinary Tract: Adrenal glands are normal. Kidneys atrophic bilaterally. Punctate nonobstructive 2 mm stone present at the lower pole of the left kidney. No other radiopaque calculi seen along the course of the left renal collecting system. No left-sided hydronephrosis or hydroureter. No right-sided renal calculi identified. No right-sided hydronephrosis or hydroureter. Bladder largely decompressed without acute abnormality. No layering stones within the bladder lumen. Stomach/Bowel: Small hiatal hernia. Stomach otherwise unremarkable. No evidence for bowel obstruction. Appendix normal. Hazy inflammatory stranding  about multiple diverticula at the descending colon, consistent with acute diverticulitis. No evidence for perforation or other complication. Vascular/Lymphatic: Mild aorto bi-iliac atherosclerotic disease. No aneurysm. No adenopathy. Reproductive: Scattered calcifications noted within the uterus and ovaries. Uterus and ovaries otherwise unremarkable. Other: No free air or fluid. Musculoskeletal: No acute osseus abnormality. No worrisome lytic or blastic osseous lesions. Multilevel facet arthropathy noted within the lower lumbar spine. IMPRESSION: 1. Findings consistent with acute diverticulitis involving the mid descending colon. No evidence for perforation or other complication. 2. Punctate 2 mm nonobstructive left renal nephrolithiasis. No other radiopaque calculi identified. No evidence for obstructive uropathy. 3. Cholelithiasis. 4. Small hiatal hernia. Electronically Signed   By: Jeannine Boga M.D.   On: 11/20/2016 04:37    Procedures Procedures (including critical care time)  Medications Ordered in ED Medications  ciprofloxacin (CIPRO) IVPB 400 mg (not administered)  metroNIDAZOLE (FLAGYL) tablet 500 mg (500 mg Oral Given 11/20/16 0536)     Initial Impression / Assessment and Plan / ED Course  I have reviewed the triage vital signs and the nursing notes.  Pertinent labs & imaging results that were available during my care of the patient were reviewed by me and considered in my medical decision making (see chart for details).     Patient presents to the ER for evaluation of left flank pain. She actually is having pain in the left upper abdomen that radiates down into the left lower abdomen. She did have tenderness in this region. She is a dialysis patient but does make urine daily. No history of UTI or kidney stone. CT scan performed reveals evidence of acute, uncomplicated diverticulitis. Discussed with pharmacy, will treat with Cipro 500 mg daily, given after dialysis on dialysis  days. Flagyl 500 mg 3 times a day daily. Follow-up with her primary doctor. She has seen a gastroenterologist, is unsure who it is, will have primary care doctor refer to GI.  Final Clinical Impressions(s) / ED Diagnoses   Final diagnoses:  Diverticulitis    New Prescriptions New Prescriptions  No medications on file  I personally performed the services described in this documentation, which was scribed in my presence. The recorded information has been reviewed and is accurate.    Orpah Greek, MD 11/20/16 [phone]

## 2016-11-20 NOTE — ED Notes (Signed)
Patient transported to CT 

## 2016-11-20 NOTE — ED Notes (Signed)
Pt continues to c/o burning at IV site.

## 2016-11-21 DIAGNOSIS — Z992 Dependence on renal dialysis: Secondary | ICD-10-CM | POA: Diagnosis not present

## 2016-11-21 DIAGNOSIS — N186 End stage renal disease: Secondary | ICD-10-CM | POA: Diagnosis not present

## 2016-11-24 DIAGNOSIS — N186 End stage renal disease: Secondary | ICD-10-CM | POA: Diagnosis not present

## 2016-11-24 DIAGNOSIS — Z992 Dependence on renal dialysis: Secondary | ICD-10-CM | POA: Diagnosis not present

## 2016-11-25 DIAGNOSIS — L84 Corns and callosities: Secondary | ICD-10-CM | POA: Diagnosis not present

## 2016-11-25 DIAGNOSIS — E1142 Type 2 diabetes mellitus with diabetic polyneuropathy: Secondary | ICD-10-CM | POA: Diagnosis not present

## 2016-11-26 DIAGNOSIS — Z992 Dependence on renal dialysis: Secondary | ICD-10-CM | POA: Diagnosis not present

## 2016-11-26 DIAGNOSIS — N186 End stage renal disease: Secondary | ICD-10-CM | POA: Diagnosis not present

## 2016-11-28 DIAGNOSIS — Z992 Dependence on renal dialysis: Secondary | ICD-10-CM | POA: Diagnosis not present

## 2016-11-28 DIAGNOSIS — N186 End stage renal disease: Secondary | ICD-10-CM | POA: Diagnosis not present

## 2016-12-01 DIAGNOSIS — Z992 Dependence on renal dialysis: Secondary | ICD-10-CM | POA: Diagnosis not present

## 2016-12-01 DIAGNOSIS — N186 End stage renal disease: Secondary | ICD-10-CM | POA: Diagnosis not present

## 2016-12-03 DIAGNOSIS — Z992 Dependence on renal dialysis: Secondary | ICD-10-CM | POA: Diagnosis not present

## 2016-12-03 DIAGNOSIS — N186 End stage renal disease: Secondary | ICD-10-CM | POA: Diagnosis not present

## 2016-12-03 DIAGNOSIS — D510 Vitamin B12 deficiency anemia due to intrinsic factor deficiency: Secondary | ICD-10-CM | POA: Diagnosis not present

## 2016-12-05 DIAGNOSIS — N186 End stage renal disease: Secondary | ICD-10-CM | POA: Diagnosis not present

## 2016-12-05 DIAGNOSIS — Z992 Dependence on renal dialysis: Secondary | ICD-10-CM | POA: Diagnosis not present

## 2016-12-08 DIAGNOSIS — N186 End stage renal disease: Secondary | ICD-10-CM | POA: Diagnosis not present

## 2016-12-08 DIAGNOSIS — Z992 Dependence on renal dialysis: Secondary | ICD-10-CM | POA: Diagnosis not present

## 2016-12-10 DIAGNOSIS — N186 End stage renal disease: Secondary | ICD-10-CM | POA: Diagnosis not present

## 2016-12-10 DIAGNOSIS — Z992 Dependence on renal dialysis: Secondary | ICD-10-CM | POA: Diagnosis not present

## 2016-12-12 DIAGNOSIS — Z992 Dependence on renal dialysis: Secondary | ICD-10-CM | POA: Diagnosis not present

## 2016-12-12 DIAGNOSIS — N186 End stage renal disease: Secondary | ICD-10-CM | POA: Diagnosis not present

## 2016-12-13 DIAGNOSIS — N186 End stage renal disease: Secondary | ICD-10-CM | POA: Diagnosis not present

## 2016-12-13 DIAGNOSIS — Z992 Dependence on renal dialysis: Secondary | ICD-10-CM | POA: Diagnosis not present

## 2016-12-15 DIAGNOSIS — Z992 Dependence on renal dialysis: Secondary | ICD-10-CM | POA: Diagnosis not present

## 2016-12-15 DIAGNOSIS — N186 End stage renal disease: Secondary | ICD-10-CM | POA: Diagnosis not present

## 2016-12-17 DIAGNOSIS — Z992 Dependence on renal dialysis: Secondary | ICD-10-CM | POA: Diagnosis not present

## 2016-12-17 DIAGNOSIS — N186 End stage renal disease: Secondary | ICD-10-CM | POA: Diagnosis not present

## 2016-12-19 DIAGNOSIS — N186 End stage renal disease: Secondary | ICD-10-CM | POA: Diagnosis not present

## 2016-12-19 DIAGNOSIS — Z992 Dependence on renal dialysis: Secondary | ICD-10-CM | POA: Diagnosis not present

## 2016-12-22 DIAGNOSIS — N186 End stage renal disease: Secondary | ICD-10-CM | POA: Diagnosis not present

## 2016-12-22 DIAGNOSIS — E119 Type 2 diabetes mellitus without complications: Secondary | ICD-10-CM | POA: Diagnosis not present

## 2016-12-22 DIAGNOSIS — Z992 Dependence on renal dialysis: Secondary | ICD-10-CM | POA: Diagnosis not present

## 2016-12-24 DIAGNOSIS — N186 End stage renal disease: Secondary | ICD-10-CM | POA: Diagnosis not present

## 2016-12-24 DIAGNOSIS — Z992 Dependence on renal dialysis: Secondary | ICD-10-CM | POA: Diagnosis not present

## 2016-12-26 DIAGNOSIS — N186 End stage renal disease: Secondary | ICD-10-CM | POA: Diagnosis not present

## 2016-12-26 DIAGNOSIS — Z992 Dependence on renal dialysis: Secondary | ICD-10-CM | POA: Diagnosis not present

## 2016-12-29 DIAGNOSIS — Z992 Dependence on renal dialysis: Secondary | ICD-10-CM | POA: Diagnosis not present

## 2016-12-29 DIAGNOSIS — N186 End stage renal disease: Secondary | ICD-10-CM | POA: Diagnosis not present

## 2016-12-31 DIAGNOSIS — Z992 Dependence on renal dialysis: Secondary | ICD-10-CM | POA: Diagnosis not present

## 2016-12-31 DIAGNOSIS — N186 End stage renal disease: Secondary | ICD-10-CM | POA: Diagnosis not present

## 2017-01-02 DIAGNOSIS — N186 End stage renal disease: Secondary | ICD-10-CM | POA: Diagnosis not present

## 2017-01-02 DIAGNOSIS — Z992 Dependence on renal dialysis: Secondary | ICD-10-CM | POA: Diagnosis not present

## 2017-01-05 DIAGNOSIS — D510 Vitamin B12 deficiency anemia due to intrinsic factor deficiency: Secondary | ICD-10-CM | POA: Diagnosis not present

## 2017-01-05 DIAGNOSIS — Z992 Dependence on renal dialysis: Secondary | ICD-10-CM | POA: Diagnosis not present

## 2017-01-05 DIAGNOSIS — N186 End stage renal disease: Secondary | ICD-10-CM | POA: Diagnosis not present

## 2017-01-07 DIAGNOSIS — N186 End stage renal disease: Secondary | ICD-10-CM | POA: Diagnosis not present

## 2017-01-07 DIAGNOSIS — Z992 Dependence on renal dialysis: Secondary | ICD-10-CM | POA: Diagnosis not present

## 2017-01-09 DIAGNOSIS — Z992 Dependence on renal dialysis: Secondary | ICD-10-CM | POA: Diagnosis not present

## 2017-01-09 DIAGNOSIS — N186 End stage renal disease: Secondary | ICD-10-CM | POA: Diagnosis not present

## 2017-01-12 DIAGNOSIS — Z992 Dependence on renal dialysis: Secondary | ICD-10-CM | POA: Diagnosis not present

## 2017-01-12 DIAGNOSIS — N186 End stage renal disease: Secondary | ICD-10-CM | POA: Diagnosis not present

## 2017-01-13 DIAGNOSIS — N186 End stage renal disease: Secondary | ICD-10-CM | POA: Diagnosis not present

## 2017-01-13 DIAGNOSIS — Z992 Dependence on renal dialysis: Secondary | ICD-10-CM | POA: Diagnosis not present

## 2017-01-14 DIAGNOSIS — N186 End stage renal disease: Secondary | ICD-10-CM | POA: Diagnosis not present

## 2017-01-14 DIAGNOSIS — Z992 Dependence on renal dialysis: Secondary | ICD-10-CM | POA: Diagnosis not present

## 2017-01-16 DIAGNOSIS — Z992 Dependence on renal dialysis: Secondary | ICD-10-CM | POA: Diagnosis not present

## 2017-01-16 DIAGNOSIS — N186 End stage renal disease: Secondary | ICD-10-CM | POA: Diagnosis not present

## 2017-01-19 DIAGNOSIS — Z992 Dependence on renal dialysis: Secondary | ICD-10-CM | POA: Diagnosis not present

## 2017-01-19 DIAGNOSIS — N186 End stage renal disease: Secondary | ICD-10-CM | POA: Diagnosis not present

## 2017-01-20 DIAGNOSIS — L600 Ingrowing nail: Secondary | ICD-10-CM | POA: Diagnosis not present

## 2017-01-20 DIAGNOSIS — B351 Tinea unguium: Secondary | ICD-10-CM | POA: Diagnosis not present

## 2017-01-20 DIAGNOSIS — E1140 Type 2 diabetes mellitus with diabetic neuropathy, unspecified: Secondary | ICD-10-CM | POA: Diagnosis not present

## 2017-01-20 DIAGNOSIS — E1151 Type 2 diabetes mellitus with diabetic peripheral angiopathy without gangrene: Secondary | ICD-10-CM | POA: Diagnosis not present

## 2017-01-21 DIAGNOSIS — Z992 Dependence on renal dialysis: Secondary | ICD-10-CM | POA: Diagnosis not present

## 2017-01-21 DIAGNOSIS — N186 End stage renal disease: Secondary | ICD-10-CM | POA: Diagnosis not present

## 2017-01-23 DIAGNOSIS — N186 End stage renal disease: Secondary | ICD-10-CM | POA: Diagnosis not present

## 2017-01-23 DIAGNOSIS — Z992 Dependence on renal dialysis: Secondary | ICD-10-CM | POA: Diagnosis not present

## 2017-01-26 DIAGNOSIS — N186 End stage renal disease: Secondary | ICD-10-CM | POA: Diagnosis not present

## 2017-01-26 DIAGNOSIS — Z992 Dependence on renal dialysis: Secondary | ICD-10-CM | POA: Diagnosis not present

## 2017-01-28 DIAGNOSIS — H52223 Regular astigmatism, bilateral: Secondary | ICD-10-CM | POA: Diagnosis not present

## 2017-01-28 DIAGNOSIS — N186 End stage renal disease: Secondary | ICD-10-CM | POA: Diagnosis not present

## 2017-01-28 DIAGNOSIS — H5212 Myopia, left eye: Secondary | ICD-10-CM | POA: Diagnosis not present

## 2017-01-28 DIAGNOSIS — Z992 Dependence on renal dialysis: Secondary | ICD-10-CM | POA: Diagnosis not present

## 2017-01-28 DIAGNOSIS — H5201 Hypermetropia, right eye: Secondary | ICD-10-CM | POA: Diagnosis not present

## 2017-01-28 DIAGNOSIS — H524 Presbyopia: Secondary | ICD-10-CM | POA: Diagnosis not present

## 2017-01-30 DIAGNOSIS — Z992 Dependence on renal dialysis: Secondary | ICD-10-CM | POA: Diagnosis not present

## 2017-01-30 DIAGNOSIS — N186 End stage renal disease: Secondary | ICD-10-CM | POA: Diagnosis not present

## 2017-02-02 DIAGNOSIS — N186 End stage renal disease: Secondary | ICD-10-CM | POA: Diagnosis not present

## 2017-02-02 DIAGNOSIS — Z992 Dependence on renal dialysis: Secondary | ICD-10-CM | POA: Diagnosis not present

## 2017-02-04 DIAGNOSIS — Z992 Dependence on renal dialysis: Secondary | ICD-10-CM | POA: Diagnosis not present

## 2017-02-04 DIAGNOSIS — N186 End stage renal disease: Secondary | ICD-10-CM | POA: Diagnosis not present

## 2017-02-04 DIAGNOSIS — D510 Vitamin B12 deficiency anemia due to intrinsic factor deficiency: Secondary | ICD-10-CM | POA: Diagnosis not present

## 2017-02-06 DIAGNOSIS — N186 End stage renal disease: Secondary | ICD-10-CM | POA: Diagnosis not present

## 2017-02-06 DIAGNOSIS — Z992 Dependence on renal dialysis: Secondary | ICD-10-CM | POA: Diagnosis not present

## 2017-02-09 DIAGNOSIS — Z992 Dependence on renal dialysis: Secondary | ICD-10-CM | POA: Diagnosis not present

## 2017-02-09 DIAGNOSIS — N186 End stage renal disease: Secondary | ICD-10-CM | POA: Diagnosis not present

## 2017-02-11 DIAGNOSIS — Z992 Dependence on renal dialysis: Secondary | ICD-10-CM | POA: Diagnosis not present

## 2017-02-11 DIAGNOSIS — N186 End stage renal disease: Secondary | ICD-10-CM | POA: Diagnosis not present

## 2017-02-13 DIAGNOSIS — N186 End stage renal disease: Secondary | ICD-10-CM | POA: Diagnosis not present

## 2017-02-13 DIAGNOSIS — Z992 Dependence on renal dialysis: Secondary | ICD-10-CM | POA: Diagnosis not present

## 2017-02-16 DIAGNOSIS — Z992 Dependence on renal dialysis: Secondary | ICD-10-CM | POA: Diagnosis not present

## 2017-02-16 DIAGNOSIS — N186 End stage renal disease: Secondary | ICD-10-CM | POA: Diagnosis not present

## 2017-02-17 DIAGNOSIS — E1122 Type 2 diabetes mellitus with diabetic chronic kidney disease: Secondary | ICD-10-CM | POA: Diagnosis not present

## 2017-02-17 DIAGNOSIS — D641 Secondary sideroblastic anemia due to disease: Secondary | ICD-10-CM | POA: Diagnosis not present

## 2017-02-17 DIAGNOSIS — N186 End stage renal disease: Secondary | ICD-10-CM | POA: Diagnosis not present

## 2017-02-17 DIAGNOSIS — D510 Vitamin B12 deficiency anemia due to intrinsic factor deficiency: Secondary | ICD-10-CM | POA: Diagnosis not present

## 2017-02-18 DIAGNOSIS — Z992 Dependence on renal dialysis: Secondary | ICD-10-CM | POA: Diagnosis not present

## 2017-02-18 DIAGNOSIS — N186 End stage renal disease: Secondary | ICD-10-CM | POA: Diagnosis not present

## 2017-02-20 DIAGNOSIS — N186 End stage renal disease: Secondary | ICD-10-CM | POA: Diagnosis not present

## 2017-02-20 DIAGNOSIS — Z992 Dependence on renal dialysis: Secondary | ICD-10-CM | POA: Diagnosis not present

## 2017-02-23 DIAGNOSIS — Z992 Dependence on renal dialysis: Secondary | ICD-10-CM | POA: Diagnosis not present

## 2017-02-23 DIAGNOSIS — N186 End stage renal disease: Secondary | ICD-10-CM | POA: Diagnosis not present

## 2017-02-25 DIAGNOSIS — Z992 Dependence on renal dialysis: Secondary | ICD-10-CM | POA: Diagnosis not present

## 2017-02-25 DIAGNOSIS — N186 End stage renal disease: Secondary | ICD-10-CM | POA: Diagnosis not present

## 2017-02-27 DIAGNOSIS — Z992 Dependence on renal dialysis: Secondary | ICD-10-CM | POA: Diagnosis not present

## 2017-02-27 DIAGNOSIS — N186 End stage renal disease: Secondary | ICD-10-CM | POA: Diagnosis not present

## 2017-03-02 DIAGNOSIS — Z992 Dependence on renal dialysis: Secondary | ICD-10-CM | POA: Diagnosis not present

## 2017-03-02 DIAGNOSIS — N186 End stage renal disease: Secondary | ICD-10-CM | POA: Diagnosis not present

## 2017-03-04 DIAGNOSIS — Z992 Dependence on renal dialysis: Secondary | ICD-10-CM | POA: Diagnosis not present

## 2017-03-04 DIAGNOSIS — N186 End stage renal disease: Secondary | ICD-10-CM | POA: Diagnosis not present

## 2017-03-06 DIAGNOSIS — Z992 Dependence on renal dialysis: Secondary | ICD-10-CM | POA: Diagnosis not present

## 2017-03-06 DIAGNOSIS — N186 End stage renal disease: Secondary | ICD-10-CM | POA: Diagnosis not present

## 2017-03-09 DIAGNOSIS — N186 End stage renal disease: Secondary | ICD-10-CM | POA: Diagnosis not present

## 2017-03-09 DIAGNOSIS — Z992 Dependence on renal dialysis: Secondary | ICD-10-CM | POA: Diagnosis not present

## 2017-03-11 DIAGNOSIS — D510 Vitamin B12 deficiency anemia due to intrinsic factor deficiency: Secondary | ICD-10-CM | POA: Diagnosis not present

## 2017-03-11 DIAGNOSIS — Z992 Dependence on renal dialysis: Secondary | ICD-10-CM | POA: Diagnosis not present

## 2017-03-11 DIAGNOSIS — N186 End stage renal disease: Secondary | ICD-10-CM | POA: Diagnosis not present

## 2017-03-13 DIAGNOSIS — Z992 Dependence on renal dialysis: Secondary | ICD-10-CM | POA: Diagnosis not present

## 2017-03-13 DIAGNOSIS — N186 End stage renal disease: Secondary | ICD-10-CM | POA: Diagnosis not present

## 2017-03-15 DIAGNOSIS — Z992 Dependence on renal dialysis: Secondary | ICD-10-CM | POA: Diagnosis not present

## 2017-03-15 DIAGNOSIS — N186 End stage renal disease: Secondary | ICD-10-CM | POA: Diagnosis not present

## 2017-03-16 DIAGNOSIS — Z992 Dependence on renal dialysis: Secondary | ICD-10-CM | POA: Diagnosis not present

## 2017-03-16 DIAGNOSIS — N186 End stage renal disease: Secondary | ICD-10-CM | POA: Diagnosis not present

## 2017-03-16 DIAGNOSIS — Z23 Encounter for immunization: Secondary | ICD-10-CM | POA: Diagnosis not present

## 2017-03-18 DIAGNOSIS — N186 End stage renal disease: Secondary | ICD-10-CM | POA: Diagnosis not present

## 2017-03-18 DIAGNOSIS — Z992 Dependence on renal dialysis: Secondary | ICD-10-CM | POA: Diagnosis not present

## 2017-03-18 DIAGNOSIS — Z23 Encounter for immunization: Secondary | ICD-10-CM | POA: Diagnosis not present

## 2017-03-20 DIAGNOSIS — Z992 Dependence on renal dialysis: Secondary | ICD-10-CM | POA: Diagnosis not present

## 2017-03-20 DIAGNOSIS — N186 End stage renal disease: Secondary | ICD-10-CM | POA: Diagnosis not present

## 2017-03-20 DIAGNOSIS — Z23 Encounter for immunization: Secondary | ICD-10-CM | POA: Diagnosis not present

## 2017-03-22 ENCOUNTER — Encounter (HOSPITAL_COMMUNITY): Payer: Self-pay | Admitting: Emergency Medicine

## 2017-03-22 ENCOUNTER — Emergency Department (HOSPITAL_COMMUNITY)
Admission: EM | Admit: 2017-03-22 | Discharge: 2017-03-22 | Disposition: A | Discharge status: Home / Self Care | Payer: Medicare Other | Attending: Emergency Medicine | Admitting: Emergency Medicine

## 2017-03-22 ENCOUNTER — Emergency Department (HOSPITAL_COMMUNITY): Payer: Medicare Other

## 2017-03-22 DIAGNOSIS — E1122 Type 2 diabetes mellitus with diabetic chronic kidney disease: Secondary | ICD-10-CM | POA: Insufficient documentation

## 2017-03-22 DIAGNOSIS — Z992 Dependence on renal dialysis: Secondary | ICD-10-CM | POA: Insufficient documentation

## 2017-03-22 DIAGNOSIS — I120 Hypertensive chronic kidney disease with stage 5 chronic kidney disease or end stage renal disease: Secondary | ICD-10-CM | POA: Diagnosis not present

## 2017-03-22 DIAGNOSIS — R569 Unspecified convulsions: Secondary | ICD-10-CM | POA: Insufficient documentation

## 2017-03-22 DIAGNOSIS — Z794 Long term (current) use of insulin: Secondary | ICD-10-CM | POA: Diagnosis not present

## 2017-03-22 DIAGNOSIS — Z79899 Other long term (current) drug therapy: Secondary | ICD-10-CM | POA: Diagnosis not present

## 2017-03-22 DIAGNOSIS — R42 Dizziness and giddiness: Secondary | ICD-10-CM | POA: Diagnosis not present

## 2017-03-22 DIAGNOSIS — Z7982 Long term (current) use of aspirin: Secondary | ICD-10-CM | POA: Insufficient documentation

## 2017-03-22 DIAGNOSIS — N186 End stage renal disease: Secondary | ICD-10-CM | POA: Diagnosis not present

## 2017-03-22 LAB — CBC WITH DIFFERENTIAL/PLATELET
Basophils Absolute: 0 10*3/uL (ref 0.0–0.1)
Basophils Relative: 0 %
Eosinophils Absolute: 0.2 10*3/uL (ref 0.0–0.7)
Eosinophils Relative: 2 %
HEMATOCRIT: 33.8 % — AB (ref 36.0–46.0)
Hemoglobin: 11 g/dL — ABNORMAL LOW (ref 12.0–15.0)
LYMPHS ABS: 1.9 10*3/uL (ref 0.7–4.0)
Lymphocytes Relative: 16 %
MCH: 31.9 pg (ref 26.0–34.0)
MCHC: 32.5 g/dL (ref 30.0–36.0)
MCV: 98 fL (ref 78.0–100.0)
MONOS PCT: 9 %
Monocytes Absolute: 1.1 10*3/uL — ABNORMAL HIGH (ref 0.1–1.0)
NEUTROS ABS: 8.6 10*3/uL — AB (ref 1.7–7.7)
Neutrophils Relative %: 73 %
Platelets: 154 10*3/uL (ref 150–400)
RBC: 3.45 MIL/uL — ABNORMAL LOW (ref 3.87–5.11)
RDW: 13.7 % (ref 11.5–15.5)
WBC: 11.8 10*3/uL — ABNORMAL HIGH (ref 4.0–10.5)

## 2017-03-22 LAB — COMPREHENSIVE METABOLIC PANEL
ALT: 16 U/L (ref 14–54)
ANION GAP: 12 (ref 5–15)
AST: 20 U/L (ref 15–41)
Albumin: 3.6 g/dL (ref 3.5–5.0)
Alkaline Phosphatase: 68 U/L (ref 38–126)
BUN: 43 mg/dL — ABNORMAL HIGH (ref 6–20)
CHLORIDE: 100 mmol/L — AB (ref 101–111)
CO2: 27 mmol/L (ref 22–32)
Calcium: 9 mg/dL (ref 8.9–10.3)
Creatinine, Ser: 9.12 mg/dL — ABNORMAL HIGH (ref 0.44–1.00)
GFR calc non Af Amer: 4 mL/min — ABNORMAL LOW (ref >60)
GFR, EST AFRICAN AMERICAN: 4 mL/min — AB (ref >60)
Glucose, Bld: 146 mg/dL — ABNORMAL HIGH (ref 65–99)
Potassium: 4.5 mmol/L (ref 3.5–5.1)
SODIUM: 139 mmol/L (ref 135–145)
Total Bilirubin: 0.6 mg/dL (ref 0.3–1.2)
Total Protein: 7.6 g/dL (ref 6.5–8.1)

## 2017-03-22 LAB — I-STAT TROPONIN, ED: Troponin i, poc: 0.02 ng/mL (ref 0.00–0.08)

## 2017-03-22 LAB — CBG MONITORING, ED: Glucose-Capillary: 131 mg/dL — ABNORMAL HIGH (ref 65–99)

## 2017-03-22 NOTE — ED Notes (Signed)
EDP aware of bp trending up with recent vital sign validations. EDP reported for pt to be discharged and take home bp medication as scheduled. nad noted.

## 2017-03-22 NOTE — Discharge Instructions (Signed)
You have been seen in the emergency department today for a likely seizure.  Your workup today including labs are within normal limits.  Please follow up with your doctor as soon as possible regarding today's emergency department visit and your likely seizure.  You will also need to follow up with a neurologist as soon as possible, please call for appointment.  If you have been prescribed a medication for your seizures, please take this medication as prescribed.  As we have discussed it is very important that you DO NOT drive until you have been seen and cleared by your neurologist.  Please drink plenty of fluids, get plenty of sleep and avoid any alcohol or drug use.  Return to the emergency department if you have any further seizures, develop any weakness/numbness of any arm/leg, confusion, slurred speech, or sudden/severe headache.  

## 2017-03-22 NOTE — ED Notes (Addendum)
Pt returned from CT °

## 2017-03-22 NOTE — ED Triage Notes (Addendum)
Per EMS, pt reports was in church when had "seizure". Pt denies pain at this time. Pt alert and oriented. Airway patent. Pt reports history of same after getting flu shot last year. Pt reports is not on seizure medicines regularly.cbg en route 132. nad noted.

## 2017-03-22 NOTE — ED Provider Notes (Signed)
Emergency Department Provider Note   I have reviewed the triage vital signs and the nursing notes.   HISTORY  Chief Complaint Seizures   HPI [name] is a 76 y.o. female with with PMH of ESRD on HD (MWF), DM, HTN, and h/o prior CVA presents to the emergency department for evaluation of shaking episode while at church today. Family report that she had a similar episode last year after getting the flu shot. Patient finished church and was feeling fine. She was walking to get some food when she again feeling lightheaded. She was able to sit down in a chair after being assisted by men at the church. Patient did not lose consciousness. Family report that her eyes were open and she was responding to people questioning her during the shaking event. They report shaking of the arms and legs. No vomiting. Patient denies any urinary incontinence or tongue biting. There was no postictal period. Family states that the shaking episode was brief but have difficulty saying exactly how much time. Patient received a flu shot approximately 13 days ago while in HD. She did not f/u with Neurology after shaking episode last year. Currently, the patient is feeling well.    Past Medical History:  Diagnosis Date  . Anemia   . Cataract   . Chronic kidney disease   . CVA (cerebral infarction)   . Diabetes mellitus    Type 2  . Diabetes mellitus without complication ([place])   . Dialysis patient ([place])   . Dialysis patient ([place])    M, W, F  . Fistula    R arm  . GERD (gastroesophageal reflux disease)   . Hypertension   . Renal disorder   . Shortness of breath   . Stroke [facility])    right side weakness    Patient Active Problem List   Diagnosis Date Noted  . DM type 2, goal A1c below 7   . HCAP (healthcare-associated pneumonia) 05/29/2015  . Hyperkalemia 05/29/2015  . Diabetes mellitus with complication ([place])   . ESRD (end stage renal disease) on dialysis ([place])   . Encounter for adequacy  testing for hemodialysis ([place]) 01/17/2014  . Mechanical complication of other vascular device, implant, and graft 01/17/2014  . Disturbances of vision, late effect of stroke 11/03/2013  . Right hemiparesis ([place]) 08/19/2013  . CVA (cerebral infarction) 07/20/2013  . Gastroparesis due to DM ([place]) 07/01/2013  . End stage renal disease ([place]) 06/25/2013  . Anemia 01/26/2013  . DM W/NEURO MNFST, TYPE II, UNCONTROLLED 11/19/2006  . Leukocytosis 11/19/2006  . DEPENDENT EDEMA, LEGS, BILATERAL 11/19/2006  . SINUS TACHYCARDIA 10/16/2006  . Diabetes mellitus, type 2 ([place]) 05/20/2006  . HYPERLIPIDEMIA 05/20/2006  . SYNDROME, RESTLESS LEGS 05/20/2006  . PERIPHERAL NEUROPATHY 05/20/2006  . Hypertension 05/20/2006    Past Surgical History:  Procedure Laterality Date  . AV FISTULA PLACEMENT Right 09/08/2013   Procedure: CREATION OF RIGHT BRACHIAL CEPHALIC ARTERIOVENOUS FISTULA ;  Surgeon: Mal Misty, MD;  Location: [place];  Service: Vascular;  Laterality: Right;  . BASCILIC VEIN TRANSPOSITION Right 01/26/2014   Procedure: Right Arm BASILIC VEIN TRANSPOSITION;  Surgeon: Mal Misty, MD;  Location: [place];  Service: Vascular;  Laterality: Right;  . CATARACT EXTRACTION W/PHACO  11/20/2011   Procedure: CATARACT EXTRACTION PHACO AND INTRAOCULAR LENS PLACEMENT (IOC);  Surgeon: Tonny Branch, MD;  Location[facility];  Service: Ophthalmology;  Laterality: Right;  CDE 18.82  . CATARACT EXTRACTION W/PHACO Left 11/18/2012   Procedure: CATARACT  EXTRACTION PHACO AND INTRAOCULAR LENS PLACEMENT (IOC);  Surgeon: Tonny Branch, MD;  Location: [facility];  Service: Ophthalmology;  Laterality: Left;  CDE: 18.97  . COLONOSCOPY N/A 02/09/2013   Procedure: COLONOSCOPY;  Surgeon: Rogene Houston, MD;  [facility];  Service: Endoscopy;  Laterality: N/A;  305-moved to 220 Ann to notify pt  . INSERTION OF DIALYSIS CATHETER Right 06/24/2013   Procedure: INSERTION OF DIALYSIS CATHETER: Ultrasound guided;  Surgeon: Serafina Mitchell,  MD;  Location: [place];  Service: Vascular;  Laterality: Right;  . LIGATION OF ARTERIOVENOUS  FISTULA Right 01/26/2014   Procedure: LIGATION OF ARTERIOVENOUS  FISTULA;  Surgeon: Mal Misty, MD;  Location: [place];  Service: Vascular;  Laterality: Right;  . 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 LOOP RECORDER IMPLANT N/A 07/21/2013   Procedure: LOOP RECORDER IMPLANT;  Surgeon: Evans Lance, MD;  [facility] C[facility];  Service: Cardiovascular;  Laterality: N/A;  . TEE WITHOUT CARDIOVERSION N/A 07/21/2013   Procedure: TRANSESOPHAGEAL ECHOCARDIOGRAM (TEE);  Surgeon: Dorothy Spark, MD;  Location: [place];  Service: Cardiovascular;  Laterality: N/A;    Current Outpatient Rx  . Order #: [id number] Class: Print  . Order #: [id number] Class: Print  . Order #: [id number] Class: Historical Med  . Order #: [id number] Class: Historical Med  . Order #: [id number] Class: Print  . Order #: [id number] Class: Historical Med  . Order #: [id number] Class: Print  . Order #: [id number] Class: Print  . Order #: [id number] Class: Historical Med  . Order #: [id number] Class: Historical Med  . Order #: [id number] Class: Historical Med  . Order #: [id number] Class: Historical Med  . Order #: [id number] Class: Print  . Order #: [id number] Class: Normal  . Order #: [id number] Class: Print  . Order #: [id number] Class: Print    Allergies Ambien [zolpidem]; Reglan [metoclopramide]; and Reglan [metoclopramide]  Family History  Problem Relation Age of Onset  . Cancer Sister   . Cancer Brother   . Anesthesia problems Neg Hx   . Hypotension Neg Hx   . Malignant hyperthermia Neg Hx   . Pseudochol deficiency Neg Hx     Social History Social History  Substance Use Topics  . Smoking status: Never Smoker  . Smokeless tobacco: Never Used  . Alcohol use No    Review of Systems  Constitutional: No fever/chills Eyes: No visual changes. ENT: No sore throat. Cardiovascular: Denies  chest pain. Respiratory: Denies shortness of breath. Gastrointestinal: No abdominal pain.  No nausea, no vomiting.  No diarrhea.  No constipation. Genitourinary: Negative for dysuria. Musculoskeletal: Negative for back pain. Skin: Negative for rash. Neurological: Negative for headaches, focal weakness or numbness. Positive seizure-like episode.   10-point ROS otherwise negative.  ____________________________________________   PHYSICAL EXAM:  VITAL SIGNS: ED Triage Vitals [03/22/17 1416]  Enc Vitals Group     BP      Pulse Rate 85     Resp 18     Temp 98.2 F (36.8 C)     Temp Source Oral     SpO2 99 %     Weight 198 lb (89.8 kg)     Height 5\' 7"  (1.702 m)   Constitutional: Alert and oriented. Well appearing and in no acute distress. Eyes: Conjunctivae are normal. PERRL.  Head: Atraumatic. Nose: No congestion/rhinnorhea. Mouth/Throat: Mucous membranes are moist.  Oropharynx non-erythematous. Neck: No stridor.  Cardiovascular: Normal rate, regular rhythm. Good peripheral circulation. Grossly normal heart sounds.   Respiratory: Normal  respiratory effort.  No retractions. Lungs CTAB. Gastrointestinal: Soft and nontender. No distention.  Musculoskeletal: No lower extremity tenderness nor edema. No gross deformities of extremities. Neurologic:  Normal speech and language. No gross focal neurologic deficits are appreciated. Normal CN exam 2-12.  Skin:  Skin is warm, dry and intact. No rash noted.  ____________________________________________   LABS (all labs ordered are listed, but only abnormal results are displayed)  Labs Reviewed  COMPREHENSIVE METABOLIC PANEL - Abnormal; Notable for the following:       Result Value   Chloride 100 (*)    Glucose, Bld 146 (*)    BUN 43 (*)    Creatinine, Ser 9.12 (*)    GFR calc non Af Amer 4 (*)    GFR calc Af Amer 4 (*)    All other components within normal limits  CBC WITH DIFFERENTIAL/PLATELET - Abnormal; Notable for the  following:    WBC 11.8 (*)    RBC 3.45 (*)    Hemoglobin 11.0 (*)    HCT 33.8 (*)    Neutro Abs 8.6 (*)    Monocytes Absolute 1.1 (*)    All other components within normal limits  CBG MONITORING, ED - Abnormal; Notable for the following:    Glucose-Capillary 131 (*)    All other components within normal limits  I-STAT TROPONIN, ED   ____________________________________________  EKG   EKG Interpretation  Date/Time:  Sunday March 22 2017 14:30:32 EDT Ventricular Rate:  83 PR Interval:    QRS Duration: 94 QT Interval:  436 QTC Calculation: 513 R Axis:   10 Text Interpretation:  Sinus rhythm Low voltage, precordial leads Prolonged QT interval No STEMI.  Similar to prior.  Confirmed by Nanda Quinton [phone]) on 03/22/2017 2:36:39 PM       ____________________________________________  RADIOLOGY  Dg Chest 1 View  Result Date: 03/22/2017 CLINICAL DATA:  Per EMS, pt reports was in church when had "seizure". Pt denies pain at this time. Pt alert and oriented. Pt reports history of same after getting flu shot last year. Renal dialysis patient EXAM: CHEST 1 VIEW COMPARISON:  05/29/2015 FINDINGS: Cardiac silhouette is mildly enlarged. No mediastinal or hilar masses. No evidence of adenopathy. Lungs are clear.  No pleural effusion or pneumothorax. Skeletal structures are demineralized but intact. IMPRESSION: No acute cardiopulmonary disease. Electronically Signed   By: Lajean Manes M.D.   On: 03/22/2017 15:43   Ct Head Wo Contrast  Result Date: 03/22/2017 CLINICAL DATA:  Seizure, new, nontraumatic, greater than 68 years old. Initial encounter. EXAM: CT HEAD WITHOUT CONTRAST TECHNIQUE: Contiguous axial images were obtained from the base of the skull through the vertex without intravenous contrast. COMPARISON:  CT head without contrast 04/08/2016. FINDINGS: Brain: At moderate atrophy and white matter disease is stable. No acute cortical infarct, hemorrhage, or mass lesion is present. The  ventricles are unchanged proportionate to the degree of atrophy. No significant extra-axial fluid collection is present. Vascular: Atherosclerotic calcifications are again noted within the cavernous internal carotid artery is bilaterally. There is no hyperdense vessel. Skull: The calvarium is intact. No focal lytic or blastic lesions are present. Sinuses/Orbits: The right sphenoid sinus is opacified. There some element of chronic disease with chronic wall thickening. The remaining paranasal sinuses and mastoid air cells are otherwise clear. Bilateral lens replacements are present. The globes and orbits are otherwise within normal limits. IMPRESSION: 1. Stable atrophy and white matter disease, moderately advanced for age. This likely reflects the sequela of chronic microvascular ischemia.  2. No acute intracranial abnormality. 3. Atherosclerosis. 4. Right sphenoid sinus opacification, acute on chronic disease. Electronically Signed   By: San Morelle M.D.   On: 03/22/2017 15:47    ____________________________________________   PROCEDURES  Procedure(s) performed:   Procedures  None ____________________________________________   INITIAL IMPRESSION / ASSESSMENT AND PLAN / ED COURSE  Pertinent labs & imaging results that were available during my care of the patient were reviewed by me and considered in my medical decision making (see chart for details).  Patient resents to the emergency department for evaluation of seizure-like activity while at church today. Patient remained conscious during her shaking episode. She had an episode similar to this last year. Family thinks it may be related to the flu shot the patient received a flu shot approximately 13 days ago. No focal neurological deficits on my exam. Patient's blood sugar with EMS was 123 on scene. States that yesterday she felt somewhat fatigued but denies any fevers. She makes very little urine but has not experienced any dysuria,  hesitancy, urgency. Have low suspicion for seizure and this case. Patient describes some mild dyspnea during the event. Plan for chest x-ray, CT head, labs, reassessment. Patient does NOT drive.   04:12 PM Lab work and imaging reviewed and unremarkable. Patient is awake/alert and has remained this way throughout her ED presentation. Low suspicion for seizures clinically after hearing the story of the event but plan for neurology evaluation as an outpatient with possibility of atypical seizure activity.   At this time, I do not feel there is any life-threatening condition present. I have reviewed and discussed all results (EKG, imaging, lab, urine as appropriate), exam findings with patient. I have reviewed nursing notes and appropriate previous records.  I feel the patient is safe to be discharged home without further emergent workup. Discussed usual and customary return precautions. Patient and family (if present) verbalize understanding and are comfortable with this plan.  Patient will follow-up with their primary care provider. If they do not have a primary care provider, information for follow-up has been provided to them. All questions have been answered.  ____________________________________________  FINAL CLINICAL IMPRESSION(S) / ED DIAGNOSES  Final diagnoses:  Seizure-like activity ([place])     MEDICATIONS GIVEN DURING THIS VISIT:  Medications - No data to display   NEW OUTPATIENT MEDICATIONS STARTED DURING THIS VISIT:  None  Note:  This document was prepared using Dragon voice recognition software and may include unintentional dictation errors.  Nanda Quinton, MD Emergency Medicine    Long, [facility], MD 03/22/17 [phone]

## 2017-03-23 DIAGNOSIS — Z23 Encounter for immunization: Secondary | ICD-10-CM | POA: Diagnosis not present

## 2017-03-23 DIAGNOSIS — Z992 Dependence on renal dialysis: Secondary | ICD-10-CM | POA: Diagnosis not present

## 2017-03-23 DIAGNOSIS — E119 Type 2 diabetes mellitus without complications: Secondary | ICD-10-CM | POA: Diagnosis not present

## 2017-03-23 DIAGNOSIS — N186 End stage renal disease: Secondary | ICD-10-CM | POA: Diagnosis not present

## 2017-03-25 DIAGNOSIS — N186 End stage renal disease: Secondary | ICD-10-CM | POA: Diagnosis not present

## 2017-03-25 DIAGNOSIS — Z992 Dependence on renal dialysis: Secondary | ICD-10-CM | POA: Diagnosis not present

## 2017-03-25 DIAGNOSIS — Z23 Encounter for immunization: Secondary | ICD-10-CM | POA: Diagnosis not present

## 2017-03-28 DIAGNOSIS — Z992 Dependence on renal dialysis: Secondary | ICD-10-CM | POA: Diagnosis not present

## 2017-03-28 DIAGNOSIS — N186 End stage renal disease: Secondary | ICD-10-CM | POA: Diagnosis not present

## 2017-03-28 DIAGNOSIS — Z23 Encounter for immunization: Secondary | ICD-10-CM | POA: Diagnosis not present

## 2017-03-30 DIAGNOSIS — Z992 Dependence on renal dialysis: Secondary | ICD-10-CM | POA: Diagnosis not present

## 2017-03-30 DIAGNOSIS — Z23 Encounter for immunization: Secondary | ICD-10-CM | POA: Diagnosis not present

## 2017-03-30 DIAGNOSIS — N186 End stage renal disease: Secondary | ICD-10-CM | POA: Diagnosis not present

## 2017-04-01 DIAGNOSIS — N186 End stage renal disease: Secondary | ICD-10-CM | POA: Diagnosis not present

## 2017-04-01 DIAGNOSIS — Z992 Dependence on renal dialysis: Secondary | ICD-10-CM | POA: Diagnosis not present

## 2017-04-01 DIAGNOSIS — Z23 Encounter for immunization: Secondary | ICD-10-CM | POA: Diagnosis not present

## 2017-04-03 DIAGNOSIS — Z992 Dependence on renal dialysis: Secondary | ICD-10-CM | POA: Diagnosis not present

## 2017-04-03 DIAGNOSIS — Z23 Encounter for immunization: Secondary | ICD-10-CM | POA: Diagnosis not present

## 2017-04-03 DIAGNOSIS — N186 End stage renal disease: Secondary | ICD-10-CM | POA: Diagnosis not present

## 2017-04-06 DIAGNOSIS — N186 End stage renal disease: Secondary | ICD-10-CM | POA: Diagnosis not present

## 2017-04-06 DIAGNOSIS — D510 Vitamin B12 deficiency anemia due to intrinsic factor deficiency: Secondary | ICD-10-CM | POA: Diagnosis not present

## 2017-04-06 DIAGNOSIS — Z992 Dependence on renal dialysis: Secondary | ICD-10-CM | POA: Diagnosis not present

## 2017-04-06 DIAGNOSIS — Z23 Encounter for immunization: Secondary | ICD-10-CM | POA: Diagnosis not present

## 2017-04-08 DIAGNOSIS — N186 End stage renal disease: Secondary | ICD-10-CM | POA: Diagnosis not present

## 2017-04-08 DIAGNOSIS — Z992 Dependence on renal dialysis: Secondary | ICD-10-CM | POA: Diagnosis not present

## 2017-04-08 DIAGNOSIS — Z23 Encounter for immunization: Secondary | ICD-10-CM | POA: Diagnosis not present

## 2017-04-10 DIAGNOSIS — Z23 Encounter for immunization: Secondary | ICD-10-CM | POA: Diagnosis not present

## 2017-04-10 DIAGNOSIS — Z992 Dependence on renal dialysis: Secondary | ICD-10-CM | POA: Diagnosis not present

## 2017-04-10 DIAGNOSIS — N186 End stage renal disease: Secondary | ICD-10-CM | POA: Diagnosis not present

## 2017-04-13 DIAGNOSIS — Z992 Dependence on renal dialysis: Secondary | ICD-10-CM | POA: Diagnosis not present

## 2017-04-13 DIAGNOSIS — N186 End stage renal disease: Secondary | ICD-10-CM | POA: Diagnosis not present

## 2017-04-13 DIAGNOSIS — Z23 Encounter for immunization: Secondary | ICD-10-CM | POA: Diagnosis not present

## 2017-04-15 DIAGNOSIS — N186 End stage renal disease: Secondary | ICD-10-CM | POA: Diagnosis not present

## 2017-04-15 DIAGNOSIS — Z23 Encounter for immunization: Secondary | ICD-10-CM | POA: Diagnosis not present

## 2017-04-15 DIAGNOSIS — Z992 Dependence on renal dialysis: Secondary | ICD-10-CM | POA: Diagnosis not present

## 2017-04-17 DIAGNOSIS — N186 End stage renal disease: Secondary | ICD-10-CM | POA: Diagnosis not present

## 2017-04-17 DIAGNOSIS — Z992 Dependence on renal dialysis: Secondary | ICD-10-CM | POA: Diagnosis not present

## 2017-04-20 DIAGNOSIS — Z992 Dependence on renal dialysis: Secondary | ICD-10-CM | POA: Diagnosis not present

## 2017-04-20 DIAGNOSIS — N186 End stage renal disease: Secondary | ICD-10-CM | POA: Diagnosis not present

## 2017-04-22 DIAGNOSIS — Z992 Dependence on renal dialysis: Secondary | ICD-10-CM | POA: Diagnosis not present

## 2017-04-22 DIAGNOSIS — N186 End stage renal disease: Secondary | ICD-10-CM | POA: Diagnosis not present

## 2017-04-24 DIAGNOSIS — Z992 Dependence on renal dialysis: Secondary | ICD-10-CM | POA: Diagnosis not present

## 2017-04-24 DIAGNOSIS — N186 End stage renal disease: Secondary | ICD-10-CM | POA: Diagnosis not present

## 2017-04-27 DIAGNOSIS — N186 End stage renal disease: Secondary | ICD-10-CM | POA: Diagnosis not present

## 2017-04-27 DIAGNOSIS — Z992 Dependence on renal dialysis: Secondary | ICD-10-CM | POA: Diagnosis not present

## 2017-04-29 DIAGNOSIS — Z992 Dependence on renal dialysis: Secondary | ICD-10-CM | POA: Diagnosis not present

## 2017-04-29 DIAGNOSIS — N186 End stage renal disease: Secondary | ICD-10-CM | POA: Diagnosis not present

## 2017-05-01 DIAGNOSIS — N186 End stage renal disease: Secondary | ICD-10-CM | POA: Diagnosis not present

## 2017-05-01 DIAGNOSIS — Z992 Dependence on renal dialysis: Secondary | ICD-10-CM | POA: Diagnosis not present

## 2017-05-01 DIAGNOSIS — D510 Vitamin B12 deficiency anemia due to intrinsic factor deficiency: Secondary | ICD-10-CM | POA: Diagnosis not present

## 2017-05-04 DIAGNOSIS — Z992 Dependence on renal dialysis: Secondary | ICD-10-CM | POA: Diagnosis not present

## 2017-05-04 DIAGNOSIS — N186 End stage renal disease: Secondary | ICD-10-CM | POA: Diagnosis not present

## 2017-05-06 DIAGNOSIS — N186 End stage renal disease: Secondary | ICD-10-CM | POA: Diagnosis not present

## 2017-05-06 DIAGNOSIS — Z992 Dependence on renal dialysis: Secondary | ICD-10-CM | POA: Diagnosis not present

## 2017-05-08 DIAGNOSIS — N186 End stage renal disease: Secondary | ICD-10-CM | POA: Diagnosis not present

## 2017-05-08 DIAGNOSIS — Z992 Dependence on renal dialysis: Secondary | ICD-10-CM | POA: Diagnosis not present

## 2017-05-11 DIAGNOSIS — Z992 Dependence on renal dialysis: Secondary | ICD-10-CM | POA: Diagnosis not present

## 2017-05-11 DIAGNOSIS — N186 End stage renal disease: Secondary | ICD-10-CM | POA: Diagnosis not present

## 2017-05-12 DIAGNOSIS — I679 Cerebrovascular disease, unspecified: Secondary | ICD-10-CM | POA: Diagnosis not present

## 2017-05-12 DIAGNOSIS — I10 Essential (primary) hypertension: Secondary | ICD-10-CM | POA: Diagnosis not present

## 2017-05-12 DIAGNOSIS — E1122 Type 2 diabetes mellitus with diabetic chronic kidney disease: Secondary | ICD-10-CM | POA: Diagnosis not present

## 2017-05-12 DIAGNOSIS — R569 Unspecified convulsions: Secondary | ICD-10-CM | POA: Diagnosis not present

## 2017-05-13 DIAGNOSIS — N186 End stage renal disease: Secondary | ICD-10-CM | POA: Diagnosis not present

## 2017-05-13 DIAGNOSIS — Z992 Dependence on renal dialysis: Secondary | ICD-10-CM | POA: Diagnosis not present

## 2017-05-15 DIAGNOSIS — N186 End stage renal disease: Secondary | ICD-10-CM | POA: Diagnosis not present

## 2017-05-15 DIAGNOSIS — Z992 Dependence on renal dialysis: Secondary | ICD-10-CM | POA: Diagnosis not present

## 2017-05-18 DIAGNOSIS — N186 End stage renal disease: Secondary | ICD-10-CM | POA: Diagnosis not present

## 2017-05-18 DIAGNOSIS — Z992 Dependence on renal dialysis: Secondary | ICD-10-CM | POA: Diagnosis not present

## 2017-05-19 DIAGNOSIS — D509 Iron deficiency anemia, unspecified: Secondary | ICD-10-CM | POA: Diagnosis not present

## 2017-05-19 DIAGNOSIS — N186 End stage renal disease: Secondary | ICD-10-CM | POA: Diagnosis not present

## 2017-05-19 DIAGNOSIS — R112 Nausea with vomiting, unspecified: Secondary | ICD-10-CM | POA: Diagnosis not present

## 2017-05-19 DIAGNOSIS — I10 Essential (primary) hypertension: Secondary | ICD-10-CM | POA: Diagnosis not present

## 2017-05-20 DIAGNOSIS — Z992 Dependence on renal dialysis: Secondary | ICD-10-CM | POA: Diagnosis not present

## 2017-05-20 DIAGNOSIS — N186 End stage renal disease: Secondary | ICD-10-CM | POA: Diagnosis not present

## 2017-05-22 DIAGNOSIS — N186 End stage renal disease: Secondary | ICD-10-CM | POA: Diagnosis not present

## 2017-05-22 DIAGNOSIS — Z992 Dependence on renal dialysis: Secondary | ICD-10-CM | POA: Diagnosis not present

## 2017-05-26 DIAGNOSIS — Z992 Dependence on renal dialysis: Secondary | ICD-10-CM | POA: Diagnosis not present

## 2017-05-26 DIAGNOSIS — N186 End stage renal disease: Secondary | ICD-10-CM | POA: Diagnosis not present

## 2017-05-27 DIAGNOSIS — N186 End stage renal disease: Secondary | ICD-10-CM | POA: Diagnosis not present

## 2017-05-27 DIAGNOSIS — Z992 Dependence on renal dialysis: Secondary | ICD-10-CM | POA: Diagnosis not present

## 2017-05-29 DIAGNOSIS — N186 End stage renal disease: Secondary | ICD-10-CM | POA: Diagnosis not present

## 2017-05-29 DIAGNOSIS — Z992 Dependence on renal dialysis: Secondary | ICD-10-CM | POA: Diagnosis not present

## 2017-06-01 DIAGNOSIS — Z992 Dependence on renal dialysis: Secondary | ICD-10-CM | POA: Diagnosis not present

## 2017-06-01 DIAGNOSIS — N186 End stage renal disease: Secondary | ICD-10-CM | POA: Diagnosis not present

## 2017-06-02 DIAGNOSIS — L600 Ingrowing nail: Secondary | ICD-10-CM | POA: Diagnosis not present

## 2017-06-02 DIAGNOSIS — E1151 Type 2 diabetes mellitus with diabetic peripheral angiopathy without gangrene: Secondary | ICD-10-CM | POA: Diagnosis not present

## 2017-06-02 DIAGNOSIS — E1140 Type 2 diabetes mellitus with diabetic neuropathy, unspecified: Secondary | ICD-10-CM | POA: Diagnosis not present

## 2017-06-02 DIAGNOSIS — B351 Tinea unguium: Secondary | ICD-10-CM | POA: Diagnosis not present

## 2017-06-03 DIAGNOSIS — N186 End stage renal disease: Secondary | ICD-10-CM | POA: Diagnosis not present

## 2017-06-03 DIAGNOSIS — Z992 Dependence on renal dialysis: Secondary | ICD-10-CM | POA: Diagnosis not present

## 2017-06-04 DIAGNOSIS — R569 Unspecified convulsions: Secondary | ICD-10-CM | POA: Diagnosis not present

## 2017-06-05 DIAGNOSIS — N186 End stage renal disease: Secondary | ICD-10-CM | POA: Diagnosis not present

## 2017-06-05 DIAGNOSIS — Z992 Dependence on renal dialysis: Secondary | ICD-10-CM | POA: Diagnosis not present

## 2017-06-07 DIAGNOSIS — N186 End stage renal disease: Secondary | ICD-10-CM | POA: Diagnosis not present

## 2017-06-07 DIAGNOSIS — Z992 Dependence on renal dialysis: Secondary | ICD-10-CM | POA: Diagnosis not present

## 2017-06-10 DIAGNOSIS — Z992 Dependence on renal dialysis: Secondary | ICD-10-CM | POA: Diagnosis not present

## 2017-06-10 DIAGNOSIS — N186 End stage renal disease: Secondary | ICD-10-CM | POA: Diagnosis not present

## 2017-06-12 DIAGNOSIS — N186 End stage renal disease: Secondary | ICD-10-CM | POA: Diagnosis not present

## 2017-06-12 DIAGNOSIS — Z992 Dependence on renal dialysis: Secondary | ICD-10-CM | POA: Diagnosis not present

## 2017-06-15 DIAGNOSIS — Z992 Dependence on renal dialysis: Secondary | ICD-10-CM | POA: Diagnosis not present

## 2017-06-15 DIAGNOSIS — N186 End stage renal disease: Secondary | ICD-10-CM | POA: Diagnosis not present

## 2017-06-17 DIAGNOSIS — Z992 Dependence on renal dialysis: Secondary | ICD-10-CM | POA: Diagnosis not present

## 2017-06-17 DIAGNOSIS — N186 End stage renal disease: Secondary | ICD-10-CM | POA: Diagnosis not present

## 2017-06-17 DIAGNOSIS — D510 Vitamin B12 deficiency anemia due to intrinsic factor deficiency: Secondary | ICD-10-CM | POA: Diagnosis not present

## 2017-06-19 DIAGNOSIS — Z992 Dependence on renal dialysis: Secondary | ICD-10-CM | POA: Diagnosis not present

## 2017-06-19 DIAGNOSIS — N186 End stage renal disease: Secondary | ICD-10-CM | POA: Diagnosis not present

## 2017-06-22 DIAGNOSIS — Z992 Dependence on renal dialysis: Secondary | ICD-10-CM | POA: Diagnosis not present

## 2017-06-22 DIAGNOSIS — N186 End stage renal disease: Secondary | ICD-10-CM | POA: Diagnosis not present

## 2017-06-23 DIAGNOSIS — R569 Unspecified convulsions: Secondary | ICD-10-CM | POA: Diagnosis not present

## 2017-06-23 DIAGNOSIS — I679 Cerebrovascular disease, unspecified: Secondary | ICD-10-CM | POA: Diagnosis not present

## 2017-06-23 DIAGNOSIS — E1122 Type 2 diabetes mellitus with diabetic chronic kidney disease: Secondary | ICD-10-CM | POA: Diagnosis not present

## 2017-06-23 DIAGNOSIS — I10 Essential (primary) hypertension: Secondary | ICD-10-CM | POA: Diagnosis not present

## 2017-06-24 DIAGNOSIS — Z992 Dependence on renal dialysis: Secondary | ICD-10-CM | POA: Diagnosis not present

## 2017-06-24 DIAGNOSIS — N186 End stage renal disease: Secondary | ICD-10-CM | POA: Diagnosis not present

## 2017-06-26 DIAGNOSIS — Z992 Dependence on renal dialysis: Secondary | ICD-10-CM | POA: Diagnosis not present

## 2017-06-26 DIAGNOSIS — N186 End stage renal disease: Secondary | ICD-10-CM | POA: Diagnosis not present

## 2017-06-29 DIAGNOSIS — Z992 Dependence on renal dialysis: Secondary | ICD-10-CM | POA: Diagnosis not present

## 2017-06-29 DIAGNOSIS — N186 End stage renal disease: Secondary | ICD-10-CM | POA: Diagnosis not present

## 2017-06-29 DIAGNOSIS — E119 Type 2 diabetes mellitus without complications: Secondary | ICD-10-CM | POA: Diagnosis not present

## 2017-07-01 DIAGNOSIS — Z992 Dependence on renal dialysis: Secondary | ICD-10-CM | POA: Diagnosis not present

## 2017-07-01 DIAGNOSIS — N186 End stage renal disease: Secondary | ICD-10-CM | POA: Diagnosis not present

## 2017-07-03 DIAGNOSIS — Z992 Dependence on renal dialysis: Secondary | ICD-10-CM | POA: Diagnosis not present

## 2017-07-03 DIAGNOSIS — N186 End stage renal disease: Secondary | ICD-10-CM | POA: Diagnosis not present

## 2017-07-06 DIAGNOSIS — Z992 Dependence on renal dialysis: Secondary | ICD-10-CM | POA: Diagnosis not present

## 2017-07-06 DIAGNOSIS — N186 End stage renal disease: Secondary | ICD-10-CM | POA: Diagnosis not present

## 2017-07-08 DIAGNOSIS — Z992 Dependence on renal dialysis: Secondary | ICD-10-CM | POA: Diagnosis not present

## 2017-07-08 DIAGNOSIS — N186 End stage renal disease: Secondary | ICD-10-CM | POA: Diagnosis not present

## 2017-07-10 DIAGNOSIS — Z992 Dependence on renal dialysis: Secondary | ICD-10-CM | POA: Diagnosis not present

## 2017-07-10 DIAGNOSIS — N186 End stage renal disease: Secondary | ICD-10-CM | POA: Diagnosis not present

## 2017-07-13 DIAGNOSIS — D509 Iron deficiency anemia, unspecified: Secondary | ICD-10-CM | POA: Diagnosis not present

## 2017-07-13 DIAGNOSIS — Z992 Dependence on renal dialysis: Secondary | ICD-10-CM | POA: Diagnosis not present

## 2017-07-13 DIAGNOSIS — N186 End stage renal disease: Secondary | ICD-10-CM | POA: Diagnosis not present

## 2017-07-15 DIAGNOSIS — Z992 Dependence on renal dialysis: Secondary | ICD-10-CM | POA: Diagnosis not present

## 2017-07-15 DIAGNOSIS — N186 End stage renal disease: Secondary | ICD-10-CM | POA: Diagnosis not present

## 2017-07-16 DIAGNOSIS — N186 End stage renal disease: Secondary | ICD-10-CM | POA: Diagnosis not present

## 2017-07-16 DIAGNOSIS — Z992 Dependence on renal dialysis: Secondary | ICD-10-CM | POA: Diagnosis not present

## 2017-07-17 DIAGNOSIS — N186 End stage renal disease: Secondary | ICD-10-CM | POA: Diagnosis not present

## 2017-07-17 DIAGNOSIS — Z992 Dependence on renal dialysis: Secondary | ICD-10-CM | POA: Diagnosis not present

## 2017-07-20 DIAGNOSIS — N186 End stage renal disease: Secondary | ICD-10-CM | POA: Diagnosis not present

## 2017-07-20 DIAGNOSIS — Z992 Dependence on renal dialysis: Secondary | ICD-10-CM | POA: Diagnosis not present

## 2017-07-22 DIAGNOSIS — N186 End stage renal disease: Secondary | ICD-10-CM | POA: Diagnosis not present

## 2017-07-22 DIAGNOSIS — Z992 Dependence on renal dialysis: Secondary | ICD-10-CM | POA: Diagnosis not present

## 2017-07-24 DIAGNOSIS — Z992 Dependence on renal dialysis: Secondary | ICD-10-CM | POA: Diagnosis not present

## 2017-07-24 DIAGNOSIS — N186 End stage renal disease: Secondary | ICD-10-CM | POA: Diagnosis not present

## 2017-07-27 DIAGNOSIS — N186 End stage renal disease: Secondary | ICD-10-CM | POA: Diagnosis not present

## 2017-07-27 DIAGNOSIS — Z992 Dependence on renal dialysis: Secondary | ICD-10-CM | POA: Diagnosis not present

## 2017-07-29 DIAGNOSIS — Z992 Dependence on renal dialysis: Secondary | ICD-10-CM | POA: Diagnosis not present

## 2017-07-29 DIAGNOSIS — N186 End stage renal disease: Secondary | ICD-10-CM | POA: Diagnosis not present

## 2017-07-31 DIAGNOSIS — Z992 Dependence on renal dialysis: Secondary | ICD-10-CM | POA: Diagnosis not present

## 2017-07-31 DIAGNOSIS — N186 End stage renal disease: Secondary | ICD-10-CM | POA: Diagnosis not present

## 2017-08-03 DIAGNOSIS — N186 End stage renal disease: Secondary | ICD-10-CM | POA: Diagnosis not present

## 2017-08-03 DIAGNOSIS — Z992 Dependence on renal dialysis: Secondary | ICD-10-CM | POA: Diagnosis not present

## 2017-08-05 DIAGNOSIS — N186 End stage renal disease: Secondary | ICD-10-CM | POA: Diagnosis not present

## 2017-08-05 DIAGNOSIS — Z992 Dependence on renal dialysis: Secondary | ICD-10-CM | POA: Diagnosis not present

## 2017-08-07 DIAGNOSIS — Z992 Dependence on renal dialysis: Secondary | ICD-10-CM | POA: Diagnosis not present

## 2017-08-07 DIAGNOSIS — N186 End stage renal disease: Secondary | ICD-10-CM | POA: Diagnosis not present

## 2017-08-10 DIAGNOSIS — D510 Vitamin B12 deficiency anemia due to intrinsic factor deficiency: Secondary | ICD-10-CM | POA: Diagnosis not present

## 2017-08-10 DIAGNOSIS — N186 End stage renal disease: Secondary | ICD-10-CM | POA: Diagnosis not present

## 2017-08-10 DIAGNOSIS — Z992 Dependence on renal dialysis: Secondary | ICD-10-CM | POA: Diagnosis not present

## 2017-08-12 DIAGNOSIS — Z992 Dependence on renal dialysis: Secondary | ICD-10-CM | POA: Diagnosis not present

## 2017-08-12 DIAGNOSIS — N186 End stage renal disease: Secondary | ICD-10-CM | POA: Diagnosis not present

## 2017-08-13 DIAGNOSIS — Z992 Dependence on renal dialysis: Secondary | ICD-10-CM | POA: Diagnosis not present

## 2017-08-13 DIAGNOSIS — N186 End stage renal disease: Secondary | ICD-10-CM | POA: Diagnosis not present

## 2017-08-14 DIAGNOSIS — N186 End stage renal disease: Secondary | ICD-10-CM | POA: Diagnosis not present

## 2017-08-14 DIAGNOSIS — Z992 Dependence on renal dialysis: Secondary | ICD-10-CM | POA: Diagnosis not present

## 2017-08-17 DIAGNOSIS — Z992 Dependence on renal dialysis: Secondary | ICD-10-CM | POA: Diagnosis not present

## 2017-08-17 DIAGNOSIS — E1140 Type 2 diabetes mellitus with diabetic neuropathy, unspecified: Secondary | ICD-10-CM | POA: Diagnosis not present

## 2017-08-17 DIAGNOSIS — E1151 Type 2 diabetes mellitus with diabetic peripheral angiopathy without gangrene: Secondary | ICD-10-CM | POA: Diagnosis not present

## 2017-08-17 DIAGNOSIS — L600 Ingrowing nail: Secondary | ICD-10-CM | POA: Diagnosis not present

## 2017-08-17 DIAGNOSIS — B351 Tinea unguium: Secondary | ICD-10-CM | POA: Diagnosis not present

## 2017-08-17 DIAGNOSIS — N186 End stage renal disease: Secondary | ICD-10-CM | POA: Diagnosis not present

## 2017-08-19 DIAGNOSIS — N186 End stage renal disease: Secondary | ICD-10-CM | POA: Diagnosis not present

## 2017-08-19 DIAGNOSIS — Z992 Dependence on renal dialysis: Secondary | ICD-10-CM | POA: Diagnosis not present

## 2017-08-21 DIAGNOSIS — Z992 Dependence on renal dialysis: Secondary | ICD-10-CM | POA: Diagnosis not present

## 2017-08-21 DIAGNOSIS — N186 End stage renal disease: Secondary | ICD-10-CM | POA: Diagnosis not present

## 2017-08-24 DIAGNOSIS — N186 End stage renal disease: Secondary | ICD-10-CM | POA: Diagnosis not present

## 2017-08-24 DIAGNOSIS — Z992 Dependence on renal dialysis: Secondary | ICD-10-CM | POA: Diagnosis not present

## 2017-08-25 DIAGNOSIS — R0601 Orthopnea: Secondary | ICD-10-CM | POA: Diagnosis not present

## 2017-08-25 DIAGNOSIS — N186 End stage renal disease: Secondary | ICD-10-CM | POA: Diagnosis not present

## 2017-08-25 DIAGNOSIS — R0600 Dyspnea, unspecified: Secondary | ICD-10-CM | POA: Diagnosis not present

## 2017-08-26 ENCOUNTER — Ambulatory Visit (HOSPITAL_COMMUNITY)
Admission: RE | Admit: 2017-08-26 | Discharge: 2017-08-26 | Disposition: A | Discharge status: Home / Self Care | Payer: Medicare Other | Source: Ambulatory Visit | Attending: Family Medicine | Admitting: Family Medicine

## 2017-08-26 ENCOUNTER — Other Ambulatory Visit (HOSPITAL_COMMUNITY): Payer: Self-pay | Admitting: Family Medicine

## 2017-08-26 DIAGNOSIS — I517 Cardiomegaly: Secondary | ICD-10-CM | POA: Diagnosis not present

## 2017-08-26 DIAGNOSIS — R0602 Shortness of breath: Secondary | ICD-10-CM | POA: Diagnosis not present

## 2017-08-26 DIAGNOSIS — Z992 Dependence on renal dialysis: Secondary | ICD-10-CM | POA: Diagnosis not present

## 2017-08-26 DIAGNOSIS — N186 End stage renal disease: Secondary | ICD-10-CM | POA: Diagnosis not present

## 2017-08-28 DIAGNOSIS — N186 End stage renal disease: Secondary | ICD-10-CM | POA: Diagnosis not present

## 2017-08-28 DIAGNOSIS — Z992 Dependence on renal dialysis: Secondary | ICD-10-CM | POA: Diagnosis not present

## 2017-08-31 DIAGNOSIS — N186 End stage renal disease: Secondary | ICD-10-CM | POA: Diagnosis not present

## 2017-08-31 DIAGNOSIS — Z992 Dependence on renal dialysis: Secondary | ICD-10-CM | POA: Diagnosis not present

## 2017-09-02 DIAGNOSIS — N186 End stage renal disease: Secondary | ICD-10-CM | POA: Diagnosis not present

## 2017-09-02 DIAGNOSIS — Z992 Dependence on renal dialysis: Secondary | ICD-10-CM | POA: Diagnosis not present

## 2017-09-04 DIAGNOSIS — Z992 Dependence on renal dialysis: Secondary | ICD-10-CM | POA: Diagnosis not present

## 2017-09-04 DIAGNOSIS — N186 End stage renal disease: Secondary | ICD-10-CM | POA: Diagnosis not present

## 2017-09-07 DIAGNOSIS — D510 Vitamin B12 deficiency anemia due to intrinsic factor deficiency: Secondary | ICD-10-CM | POA: Diagnosis not present

## 2017-09-07 DIAGNOSIS — Z992 Dependence on renal dialysis: Secondary | ICD-10-CM | POA: Diagnosis not present

## 2017-09-07 DIAGNOSIS — N186 End stage renal disease: Secondary | ICD-10-CM | POA: Diagnosis not present

## 2017-09-09 DIAGNOSIS — Z992 Dependence on renal dialysis: Secondary | ICD-10-CM | POA: Diagnosis not present

## 2017-09-09 DIAGNOSIS — N186 End stage renal disease: Secondary | ICD-10-CM | POA: Diagnosis not present

## 2017-09-11 DIAGNOSIS — Z992 Dependence on renal dialysis: Secondary | ICD-10-CM | POA: Diagnosis not present

## 2017-09-11 DIAGNOSIS — N186 End stage renal disease: Secondary | ICD-10-CM | POA: Diagnosis not present

## 2017-09-13 DIAGNOSIS — Z992 Dependence on renal dialysis: Secondary | ICD-10-CM | POA: Diagnosis not present

## 2017-09-13 DIAGNOSIS — N186 End stage renal disease: Secondary | ICD-10-CM | POA: Diagnosis not present

## 2017-09-14 DIAGNOSIS — N186 End stage renal disease: Secondary | ICD-10-CM | POA: Diagnosis not present

## 2017-09-14 DIAGNOSIS — Z992 Dependence on renal dialysis: Secondary | ICD-10-CM | POA: Diagnosis not present

## 2017-09-15 DIAGNOSIS — I10 Essential (primary) hypertension: Secondary | ICD-10-CM | POA: Diagnosis not present

## 2017-09-15 DIAGNOSIS — E1122 Type 2 diabetes mellitus with diabetic chronic kidney disease: Secondary | ICD-10-CM | POA: Diagnosis not present

## 2017-09-15 DIAGNOSIS — R569 Unspecified convulsions: Secondary | ICD-10-CM | POA: Diagnosis not present

## 2017-09-15 DIAGNOSIS — I679 Cerebrovascular disease, unspecified: Secondary | ICD-10-CM | POA: Diagnosis not present

## 2017-09-16 DIAGNOSIS — N186 End stage renal disease: Secondary | ICD-10-CM | POA: Diagnosis not present

## 2017-09-16 DIAGNOSIS — Z992 Dependence on renal dialysis: Secondary | ICD-10-CM | POA: Diagnosis not present

## 2017-09-18 DIAGNOSIS — Z992 Dependence on renal dialysis: Secondary | ICD-10-CM | POA: Diagnosis not present

## 2017-09-18 DIAGNOSIS — N186 End stage renal disease: Secondary | ICD-10-CM | POA: Diagnosis not present

## 2017-09-21 DIAGNOSIS — Z992 Dependence on renal dialysis: Secondary | ICD-10-CM | POA: Diagnosis not present

## 2017-09-21 DIAGNOSIS — N186 End stage renal disease: Secondary | ICD-10-CM | POA: Diagnosis not present

## 2017-09-21 DIAGNOSIS — E119 Type 2 diabetes mellitus without complications: Secondary | ICD-10-CM | POA: Diagnosis not present

## 2017-09-23 DIAGNOSIS — Z992 Dependence on renal dialysis: Secondary | ICD-10-CM | POA: Diagnosis not present

## 2017-09-23 DIAGNOSIS — N186 End stage renal disease: Secondary | ICD-10-CM | POA: Diagnosis not present

## 2017-09-25 DIAGNOSIS — Z992 Dependence on renal dialysis: Secondary | ICD-10-CM | POA: Diagnosis not present

## 2017-09-25 DIAGNOSIS — N186 End stage renal disease: Secondary | ICD-10-CM | POA: Diagnosis not present

## 2017-09-28 DIAGNOSIS — N186 End stage renal disease: Secondary | ICD-10-CM | POA: Diagnosis not present

## 2017-09-28 DIAGNOSIS — Z992 Dependence on renal dialysis: Secondary | ICD-10-CM | POA: Diagnosis not present

## 2017-09-30 DIAGNOSIS — N186 End stage renal disease: Secondary | ICD-10-CM | POA: Diagnosis not present

## 2017-09-30 DIAGNOSIS — Z992 Dependence on renal dialysis: Secondary | ICD-10-CM | POA: Diagnosis not present

## 2017-10-02 DIAGNOSIS — N186 End stage renal disease: Secondary | ICD-10-CM | POA: Diagnosis not present

## 2017-10-02 DIAGNOSIS — Z992 Dependence on renal dialysis: Secondary | ICD-10-CM | POA: Diagnosis not present

## 2017-10-05 DIAGNOSIS — Z992 Dependence on renal dialysis: Secondary | ICD-10-CM | POA: Diagnosis not present

## 2017-10-05 DIAGNOSIS — N186 End stage renal disease: Secondary | ICD-10-CM | POA: Diagnosis not present

## 2017-10-07 DIAGNOSIS — N186 End stage renal disease: Secondary | ICD-10-CM | POA: Diagnosis not present

## 2017-10-07 DIAGNOSIS — Z992 Dependence on renal dialysis: Secondary | ICD-10-CM | POA: Diagnosis not present

## 2017-10-07 DIAGNOSIS — D510 Vitamin B12 deficiency anemia due to intrinsic factor deficiency: Secondary | ICD-10-CM | POA: Diagnosis not present

## 2017-10-09 DIAGNOSIS — Z992 Dependence on renal dialysis: Secondary | ICD-10-CM | POA: Diagnosis not present

## 2017-10-09 DIAGNOSIS — N186 End stage renal disease: Secondary | ICD-10-CM | POA: Diagnosis not present

## 2017-10-12 DIAGNOSIS — Z992 Dependence on renal dialysis: Secondary | ICD-10-CM | POA: Diagnosis not present

## 2017-10-12 DIAGNOSIS — N186 End stage renal disease: Secondary | ICD-10-CM | POA: Diagnosis not present

## 2017-10-13 DIAGNOSIS — N186 End stage renal disease: Secondary | ICD-10-CM | POA: Diagnosis not present

## 2017-10-13 DIAGNOSIS — Z992 Dependence on renal dialysis: Secondary | ICD-10-CM | POA: Diagnosis not present

## 2017-10-14 ENCOUNTER — Other Ambulatory Visit: Payer: Self-pay

## 2017-10-14 ENCOUNTER — Observation Stay (HOSPITAL_COMMUNITY)
Admission: EM | Admit: 2017-10-14 | Discharge: 2017-10-16 | Disposition: A | Discharge status: Home / Self Care | Payer: Medicare Other | Attending: Internal Medicine | Admitting: Internal Medicine

## 2017-10-14 ENCOUNTER — Emergency Department (HOSPITAL_COMMUNITY): Payer: Medicare Other

## 2017-10-14 ENCOUNTER — Encounter (HOSPITAL_COMMUNITY): Payer: Self-pay | Admitting: *Deleted

## 2017-10-14 DIAGNOSIS — I120 Hypertensive chronic kidney disease with stage 5 chronic kidney disease or end stage renal disease: Secondary | ICD-10-CM | POA: Insufficient documentation

## 2017-10-14 DIAGNOSIS — Z992 Dependence on renal dialysis: Secondary | ICD-10-CM | POA: Insufficient documentation

## 2017-10-14 DIAGNOSIS — J40 Bronchitis, not specified as acute or chronic: Secondary | ICD-10-CM | POA: Diagnosis not present

## 2017-10-14 DIAGNOSIS — E785 Hyperlipidemia, unspecified: Secondary | ICD-10-CM | POA: Diagnosis not present

## 2017-10-14 DIAGNOSIS — J209 Acute bronchitis, unspecified: Secondary | ICD-10-CM | POA: Diagnosis not present

## 2017-10-14 DIAGNOSIS — J9801 Acute bronchospasm: Secondary | ICD-10-CM | POA: Diagnosis not present

## 2017-10-14 DIAGNOSIS — E118 Type 2 diabetes mellitus with unspecified complications: Secondary | ICD-10-CM | POA: Diagnosis present

## 2017-10-14 DIAGNOSIS — R05 Cough: Secondary | ICD-10-CM | POA: Diagnosis not present

## 2017-10-14 DIAGNOSIS — Z7982 Long term (current) use of aspirin: Secondary | ICD-10-CM | POA: Diagnosis not present

## 2017-10-14 DIAGNOSIS — I69351 Hemiplegia and hemiparesis following cerebral infarction affecting right dominant side: Secondary | ICD-10-CM | POA: Insufficient documentation

## 2017-10-14 DIAGNOSIS — Z79899 Other long term (current) drug therapy: Secondary | ICD-10-CM | POA: Diagnosis not present

## 2017-10-14 DIAGNOSIS — E1122 Type 2 diabetes mellitus with diabetic chronic kidney disease: Secondary | ICD-10-CM | POA: Insufficient documentation

## 2017-10-14 DIAGNOSIS — I10 Essential (primary) hypertension: Secondary | ICD-10-CM

## 2017-10-14 DIAGNOSIS — N186 End stage renal disease: Secondary | ICD-10-CM | POA: Diagnosis not present

## 2017-10-14 DIAGNOSIS — Z794 Long term (current) use of insulin: Secondary | ICD-10-CM | POA: Diagnosis not present

## 2017-10-14 LAB — CBC WITH DIFFERENTIAL/PLATELET
BASOS ABS: 0 10*3/uL (ref 0.0–0.1)
Basophils Relative: 0 %
Eosinophils Absolute: 0 10*3/uL (ref 0.0–0.7)
Eosinophils Relative: 0 %
HEMATOCRIT: 31.3 % — AB (ref 36.0–46.0)
HEMOGLOBIN: 10.1 g/dL — AB (ref 12.0–15.0)
LYMPHS ABS: 0.6 10*3/uL — AB (ref 0.7–4.0)
LYMPHS PCT: 4 %
MCH: 31.2 pg (ref 26.0–34.0)
MCHC: 32.3 g/dL (ref 30.0–36.0)
MCV: 96.6 fL (ref 78.0–100.0)
Monocytes Absolute: 1.9 10*3/uL — ABNORMAL HIGH (ref 0.1–1.0)
Monocytes Relative: 12 %
NEUTROS ABS: 13.2 10*3/uL — AB (ref 1.7–7.7)
Neutrophils Relative %: 84 %
Platelets: 135 10*3/uL — ABNORMAL LOW (ref 150–400)
RBC: 3.24 MIL/uL — AB (ref 3.87–5.11)
RDW: 13.9 % (ref 11.5–15.5)
WBC: 15.8 10*3/uL — AB (ref 4.0–10.5)

## 2017-10-14 LAB — COMPREHENSIVE METABOLIC PANEL
ALBUMIN: 3.7 g/dL (ref 3.5–5.0)
ALT: 17 U/L (ref 14–54)
ANION GAP: 15 (ref 5–15)
AST: 24 U/L (ref 15–41)
Alkaline Phosphatase: 63 U/L (ref 38–126)
BILIRUBIN TOTAL: 0.8 mg/dL (ref 0.3–1.2)
BUN: 21 mg/dL — ABNORMAL HIGH (ref 6–20)
CHLORIDE: 99 mmol/L — AB (ref 101–111)
CO2: 25 mmol/L (ref 22–32)
Calcium: 8.6 mg/dL — ABNORMAL LOW (ref 8.9–10.3)
Creatinine, Ser: 5.74 mg/dL — ABNORMAL HIGH (ref 0.44–1.00)
GFR calc Af Amer: 7 mL/min — ABNORMAL LOW (ref >60)
GFR, EST NON AFRICAN AMERICAN: 6 mL/min — AB (ref >60)
Glucose, Bld: 255 mg/dL — ABNORMAL HIGH (ref 65–99)
Potassium: 4.8 mmol/L (ref 3.5–5.1)
Sodium: 139 mmol/L (ref 135–145)
TOTAL PROTEIN: 8.2 g/dL — AB (ref 6.5–8.1)

## 2017-10-14 LAB — I-STAT CG4 LACTIC ACID, ED: Lactic Acid, Venous: 0.99 mmol/L (ref 0.5–1.9)

## 2017-10-14 MED ORDER — ONDANSETRON 4 MG PO TBDP: 4.0000 mg | ORAL_TABLET | Freq: Once | ORAL | Status: DC

## 2017-10-14 MED ORDER — ALBUTEROL SULFATE (2.5 MG/3ML) 0.083% IN NEBU
5.0000 mg | INHALATION_SOLUTION | Freq: Once | RESPIRATORY_TRACT | Status: AC
  Administered 2017-10-14: 5 mg via RESPIRATORY_TRACT
  Filled 2017-10-14: qty 6

## 2017-10-14 NOTE — ED Notes (Signed)
Pt dry heaves while in triage, comfort measures provided,

## 2017-10-14 NOTE — ED Provider Notes (Signed)
[facility] EMERGENCY DEPARTMENT Provider Note   CSN: [id number] Arrival date & time: 10/14/17  2226  Time seen 23:08 PM   History   Chief Complaint Chief Complaint  Patient presents with  . Cough    HPI [name] is a 77 y.o. female.  HPI patient states April 29 she started feeling tired and weak with decreased appetite.  Yesterday, may 30th she started having a cough of white mucus production.  They are unsure of fever but she has had chills.  She has rhinorrhea that is clear and sneezing but denies sore throat, wheezing, chest pain, and patient denies nausea, vomiting, or diarrhea to me.  Although evidently she told the triage nurse she was having nausea and vomiting.  She states she is short of breath but only during dialysis, and on further talking she states she has had that for 3 years.  That is nothing new.  She states a lot of people at dialysis have been coughing.  PCP [name], MD   Past Medical History:  Diagnosis Date  . Anemia   . Cataract   . Chronic kidney disease   . CVA (cerebral infarction)   . Diabetes mellitus    Type 2  . Diabetes mellitus without complication ([place])   . Dialysis patient ([place])   . Dialysis patient ([place])    M, W, F  . Fistula    R arm  . GERD (gastroesophageal reflux disease)   . Hypertension   . Renal disorder   . Shortness of breath   . Stroke [facility])    right side weakness    Patient Active Problem List   Diagnosis Date Noted  . DM type 2, goal A1c below 7   . HCAP (healthcare-associated pneumonia) 05/29/2015  . Hyperkalemia 05/29/2015  . Diabetes mellitus with complication ([place])   . ESRD (end stage renal disease) on dialysis ([place])   . Encounter for adequacy testing for hemodialysis ([place]) 01/17/2014  . Mechanical complication of other vascular device, implant, and graft 01/17/2014  . Disturbances of vision, late effect of stroke 11/03/2013  . Right hemiparesis ([place]) 08/19/2013  . CVA (cerebral infarction)  07/20/2013  . Gastroparesis due to DM ([place]) 07/01/2013  . End stage renal disease ([place]) 06/25/2013  . Anemia 01/26/2013  . DM W/NEURO MNFST, TYPE II, UNCONTROLLED 11/19/2006  . Leukocytosis 11/19/2006  . DEPENDENT EDEMA, LEGS, BILATERAL 11/19/2006  . SINUS TACHYCARDIA 10/16/2006  . Diabetes mellitus, type 2 ([place]) 05/20/2006  . HYPERLIPIDEMIA 05/20/2006  . SYNDROME, RESTLESS LEGS 05/20/2006  . PERIPHERAL NEUROPATHY 05/20/2006  . Hypertension 05/20/2006    Past Surgical History:  Procedure Laterality Date  . AV FISTULA PLACEMENT Right 09/08/2013   Procedure: CREATION OF RIGHT BRACHIAL CEPHALIC ARTERIOVENOUS FISTULA ;  Surgeon: Mal Misty, MD;  Location: [place];  Service: Vascular;  Laterality: Right;  . BASCILIC VEIN TRANSPOSITION Right 01/26/2014   Procedure: Right Arm BASILIC VEIN TRANSPOSITION;  Surgeon: Mal Misty, MD;  Location: [place];  Service: Vascular;  Laterality: Right;  . CATARACT EXTRACTION W/PHACO  11/20/2011   Procedure: CATARACT EXTRACTION PHACO AND INTRAOCULAR LENS PLACEMENT (IOC);  Surgeon: Tonny Branch, MD;  Location: [facility];  Service: Ophthalmology;  Laterality: Right;  CDE 18.82  . CATARACT EXTRACTION W/PHACO Left 11/18/2012   Procedure: CATARACT EXTRACTION PHACO AND INTRAOCULAR LENS PLACEMENT (IOC);  Surgeon: Tonny Branch, MD;  Location: [facility];  Service: Ophthalmology;  Laterality: Left;  CDE: 18.97  . COLONOSCOPY N/A 02/09/2013   Procedure:  COLONOSCOPY;  Surgeon: Rogene Houston, MD;  [facility];  Service: Endoscopy;  Laterality: N/A;  305-moved to 220 Ann to notify pt  . INSERTION OF DIALYSIS CATHETER Right 06/24/2013   Procedure: INSERTION OF DIALYSIS CATHETER: Ultrasound guided;  Surgeon: Serafina Mitchell, MD;  Location: [place];  Service: Vascular;  Laterality: Right;  . LIGATION OF ARTERIOVENOUS  FISTULA Right 01/26/2014   Procedure: LIGATION OF ARTERIOVENOUS  FISTULA;  Surgeon: Mal Misty, MD;  Location: [place];  Service: Vascular;  Laterality: Right;    . 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 LOOP RECORDER IMPLANT N/A 07/21/2013   Procedure: LOOP RECORDER IMPLANT;  Surgeon: Evans Lance, MD;  Location: [facility] CATH LAB;  Service: Cardiovascular;  Laterality: N/A;  . TEE WITHOUT CARDIOVERSION N/A 07/21/2013   Procedure: TRANSESOPHAGEAL ECHOCARDIOGRAM (TEE);  Surgeon: Dorothy Spark, MD;  Location: [facility] ENDOSCOPY;  Service: Cardiovascular;  Laterality: N/A;     OB History    Gravida  0   Para  0   Term  0   Preterm  0   AB  0   Living        SAB  0   TAB  0   Ectopic  0   Multiple      Live Births               Home Medications    Prior to Admission medications   Medication Sig Start Date End Date Taking? Authorizing Provider  albuterol (PROVENTIL HFA;VENTOLIN HFA) 108 (90 BASE) MCG/ACT inhaler Inhale 1-2 puffs into the lungs every 6 (six) hours as needed for wheezing. Use 2 puffs 3 times daily x 4 days then as needed. 06/03/15   Eugenie Filler, MD  amoxicillin-clavulanate (AUGMENTIN) 500-125 MG tablet Take 1 tablet (500 mg total) by mouth daily. Take for 4 days then stop. Patient not taking: Reported on 04/08/2016 06/03/15   Eugenie Filler, MD  aspirin 81 MG chewable tablet Chew 81 mg by mouth daily.    [provider]  atorvastatin (LIPITOR) 20 MG tablet Take 1 tablet by mouth daily. 11/09/14   [provider]  ciprofloxacin (CIPRO) 500 MG tablet Take 1 tablet (500 mg total) by mouth daily. Take after dialysis 11/20/16   Orpah Greek, MD  cyanocobalamin (,VITAMIN B-12,) 1000 MCG/ML injection Inject 1 mL into the muscle every 30 (thirty) days. 11/01/14   [provider]  fluticasone (FLONASE) 50 MCG/ACT nasal spray Place 2 sprays into both nostrils daily. Use for 5 days then as needed. Patient not taking: Reported on 04/08/2016 06/03/15   Eugenie Filler, MD  guaiFENesin (MUCINEX) 600 MG 12 hr tablet Take 2 tablets (1,200 mg  total) by mouth 2 (two) times daily. Take for 5 days then stop. Patient not taking: Reported on 04/08/2016 06/03/15   Eugenie Filler, MD  ibuprofen (ADVIL,MOTRIN) 200 MG tablet Take 200-400 mg by mouth every 6 (six) hours as needed for moderate pain.     [provider]  insulin NPH Human (HUMULIN N,NOVOLIN N) 100 UNIT/ML injection Inject 10 Units into the skin 2 (two) times daily before a meal.    [provider]  labetalol (NORMODYNE) 300 MG tablet Take 300 mg by mouth 2 (two) times daily. 11/06/14   [provider]  meclizine (ANTIVERT) 25 MG tablet Take 1 tablet by mouth every 6 (six) hours as needed for  dizziness.  11/07/14   [provider]  metroNIDAZOLE (FLAGYL) 500 MG tablet Take 1 tablet (500 mg total) by mouth 3 (three) times daily. 11/20/16   Orpah Greek, MD  multivitamin (RENA-VIT) TABS tablet Take 1 tablet by mouth at bedtime. 08/21/14   Kirsteins, Luanna Salk, MD  predniSONE (DELTASONE) 20 MG tablet Take 1-3 tablets (20-60 mg total) by mouth daily before breakfast. Take 3 tablets (60mg ) daily x 1 day, then 2 tablets (40mg )daily x 3 days, then 1 tablet (20mg )daily x 3 days then stop. Patient not taking: Reported on 04/08/2016 06/03/15   Eugenie Filler, MD  traMADol (ULTRAM) 50 MG tablet Take 1 tablet (50 mg total) by mouth every 6 (six) hours as needed. 11/20/16   Orpah Greek, MD    Family History Family History  Problem Relation Age of Onset  . Cancer Sister   . Cancer Brother   . Anesthesia problems Neg Hx   . Hypotension Neg Hx   . Malignant hyperthermia Neg Hx   . Pseudochol deficiency Neg Hx     Social History Social History   Tobacco Use  . Smoking status: Never Smoker  . Smokeless tobacco: Never Used  Substance Use Topics  . Alcohol use: No  . Drug use: No  lives with 2 sons Husband used to smoke  Allergies   Ambien [zolpidem]; Reglan [metoclopramide]; and Reglan [metoclopramide]   Review of  Systems Review of Systems  All other systems reviewed and are negative.    Physical Exam Updated Vital Signs BP (!) 199/78 (BP Location: Left Arm)   Pulse 89   Temp 99.5 F (37.5 C) (Oral)   Resp 16   Ht 5\' 7"  (1.702 m)   Wt 81.6 kg (180 lb)   SpO2 96%   BMI 28.19 kg/m   Vital signs normal except low-grade temperature, hypertension   Physical Exam  Constitutional: She is oriented to person, place, and time. She appears well-developed and well-nourished.  Non-toxic appearance. She does not appear ill. No distress.  HENT:  Head: Normocephalic and atraumatic.  Right Ear: External ear normal.  Left Ear: External ear normal.  Nose: Nose normal. No mucosal edema or rhinorrhea.  Mouth/Throat: Oropharynx is clear and moist and mucous membranes are normal. No dental abscesses or uvula swelling.  Eyes: Pupils are equal, round, and reactive to light. Conjunctivae and EOM are normal.  Neck: Normal range of motion and full passive range of motion without pain. Neck supple.  Cardiovascular: Normal rate, regular rhythm and normal heart sounds. Exam reveals no gallop and no friction rub.  No murmur heard. Pulmonary/Chest: Effort normal. No respiratory distress. She has decreased breath sounds. She has no wheezes. She has rhonchi. She has no rales. She exhibits no tenderness and no crepitus.  Abdominal: Soft. Normal appearance and bowel sounds are normal. She exhibits no distension. There is no tenderness. There is no rebound and no guarding.  Musculoskeletal: Normal range of motion. She exhibits no edema or tenderness.  Moves all extremities well. Pt has a fistula in her right upper arm  Neurological: She is alert and oriented to person, place, and time. She has normal strength. No cranial nerve deficit.  Skin: Skin is warm, dry and intact. No rash noted. No erythema. No pallor.  Psychiatric: She has a normal mood and affect. Her speech is normal and behavior is normal. Her mood appears not  anxious.  Nursing note and vitals reviewed.    ED Treatments / Results  Labs (all labs ordered are listed, but only abnormal results are displayed) Results for orders placed or performed during the hospital encounter of 10/14/17  Culture, blood (Routine x 2)  Result Value Ref Range   Specimen Description BLOOD LEFT ARM    Special Requests      BOTTLES DRAWN AEROBIC AND ANAEROBIC Blood Culture adequate volume Performed at [facility], [address] [place], [place] [address]    Culture PENDING    Report Status PENDING   Culture, blood (Routine x 2)  Result Value Ref Range   Specimen Description BLOOD LEFT HAND    Special Requests      BOTTLES DRAWN AEROBIC AND ANAEROBIC Blood Culture adequate volume Performed at [facility], [address], [place], [place] [address]    Culture PENDING    Report Status PENDING   Comprehensive metabolic panel  Result Value Ref Range   Sodium 139 135 - 145 mmol/L   Potassium 4.8 3.5 - 5.1 mmol/L   Chloride 99 (L) 101 - 111 mmol/L   CO2 25 22 - 32 mmol/L   Glucose, Bld 255 (H) 65 - 99 mg/dL   BUN 21 (H) 6 - 20 mg/dL   Creatinine, Ser 5.74 (H) 0.44 - 1.00 mg/dL   Calcium 8.6 (L) 8.9 - 10.3 mg/dL   Total Protein 8.2 (H) 6.5 - 8.1 g/dL   Albumin 3.7 3.5 - 5.0 g/dL   AST 24 15 - 41 U/L   ALT 17 14 - 54 U/L   Alkaline Phosphatase 63 38 - 126 U/L   Total Bilirubin 0.8 0.3 - 1.2 mg/dL   GFR calc non Af Amer 6 (L) >60 mL/min   GFR calc Af Amer 7 (L) >60 mL/min   Anion gap 15 5 - 15  CBC with Differential  Result Value Ref Range   WBC 15.8 (H) 4.0 - 10.5 K/uL   RBC 3.24 (L) 3.87 - 5.11 MIL/uL   Hemoglobin 10.1 (L) 12.0 - 15.0 g/dL   HCT 31.3 (L) 36.0 - 46.0 %   MCV 96.6 78.0 - 100.0 fL   MCH 31.2 26.0 - 34.0 pg   MCHC 32.3 30.0 - 36.0 g/dL   RDW 13.9 11.5 - 15.5 %   Platelets 135 (L) 150 - 400 K/uL   Neutrophils Relative % 84 %   Neutro Abs 13.2 (H) 1.7 - 7.7 K/uL   Lymphocytes Relative 4 %   Lymphs Abs 0.6 (L) 0.7 - 4.0 K/uL    Monocytes Relative 12 %   Monocytes Absolute 1.9 (H) 0.1 - 1.0 K/uL   Eosinophils Relative 0 %   Eosinophils Absolute 0.0 0.0 - 0.7 K/uL   Basophils Relative 0 %   Basophils Absolute 0.0 0.0 - 0.1 K/uL  Protime-INR  Result Value Ref Range   Prothrombin Time 15.4 (H) 11.4 - 15.2 seconds   INR 1.23   I-Stat CG4 Lactic Acid, ED  Result Value Ref Range   Lactic Acid, Venous 0.99 0.5 - 1.9 mmol/L   Laboratory interpretation all normal except leukocytosis, anemia, hyperglycemia, renal failure chronic    EKG None  Radiology Dg Chest 2 View  Result Date: 10/14/2017 CLINICAL DATA:  Cough and fever EXAM: CHEST - 2 VIEW COMPARISON:  Chest radiograph 08/26/2017 FINDINGS: Unchanged cardiomegaly. No pulmonary edema or focal airspace consolidation. No pleural effusion or pneumothorax. IMPRESSION: Unchanged cardiomegaly without acute airspace disease. Electronically Signed   By: Ulyses Jarred M.D.   On: 10/14/2017 23:49    Procedures .Critical Care  Performed by: Rolland Porter, MD Authorized by: Rolland Porter, MD   Critical care provider statement:    Critical care time (minutes):  31   Critical care was necessary to treat or prevent imminent or life-threatening deterioration of the following conditions:  Respiratory failure   Critical care was time spent personally by me on the following activities:  Discussions with consultants, examination of patient, obtaining history from patient or surrogate, ordering and review of laboratory studies, ordering and review of radiographic studies, pulse oximetry and re-evaluation of patient's condition   (including critical care time)  Medications Ordered in ED Medications  ondansetron (ZOFRAN-ODT) disintegrating tablet 4 mg (4 mg Oral Not Given 10/15/17 0040)  dextromethorphan-guaiFENesin (MUCINEX DM) 30-600 MG per 12 hr tablet 1 tablet (1 tablet Oral Given 10/15/17 0441)  albuterol (PROVENTIL) (2.5 MG/3ML) 0.083% nebulizer solution 5 mg (5 mg Nebulization Given  10/14/17 2353)  albuterol (PROVENTIL) (2.5 MG/3ML) 0.083% nebulizer solution 5 mg (5 mg Nebulization Given 10/15/17 0257)  albuterol (PROVENTIL) (2.5 MG/3ML) 0.083% nebulizer solution 5 mg (5 mg Nebulization Given 10/15/17 0421)  nitroGLYCERIN (NITROGLYN) 2 % ointment 1 inch (1 inch Topical Given 10/15/17 0411)  methylPREDNISolone sodium succinate (SOLU-MEDROL) 125 mg/2 mL injection 125 mg (125 mg Intravenous Given 10/15/17 0413)     Initial Impression / Assessment and Plan / ED Course  I have reviewed the triage vital signs and the nursing notes.  Pertinent labs & imaging results that were available during my care of the patient were reviewed by me and considered in my medical decision making (see chart for details).     Due to patient's rhonchi on lung exam she was given an albuterol nebulizer treatment.  Laboratory testing and chest x-ray are still pending.  Recheck at 2 AM patient states her breathing is a little bit better.  When I listen to her she has some improved air movement but still has some scattered wheezing.  A second nebulizer was ordered.  We discussed her chest x-ray results.  Recheck at 3:20 AM patient now is seeming to be more short of breath, she is grunting, she still has some scattered rhonchi in all lung fields now.  She was given a third albuterol nebulizer and given Solu-Medrol IV.  She was given Mucinex DM for cough.  When she coughs her breathing seems to get worse.   Recheck at 5 AM patient still has some diffuse rhonchi.  We discussed staying in the hospital for admission and she is agreeable.  05:21 AM Dr Darrick Meigs, hospitalist, will admit  Final Clinical Impressions(s) / ED Diagnoses   Final diagnoses:  Bronchitis with bronchospasm  Essential hypertension    Plan admission  Rolland Porter, MD, Barbette Or, MD 10/15/17 [phone]

## 2017-10-14 NOTE — ED Triage Notes (Signed)
Pt c/o cough that is productive with white sputum, fever, n/v that started yesterday

## 2017-10-15 ENCOUNTER — Encounter (HOSPITAL_COMMUNITY): Payer: Self-pay

## 2017-10-15 ENCOUNTER — Other Ambulatory Visit: Payer: Self-pay

## 2017-10-15 DIAGNOSIS — J209 Acute bronchitis, unspecified: Secondary | ICD-10-CM | POA: Diagnosis not present

## 2017-10-15 DIAGNOSIS — I10 Essential (primary) hypertension: Secondary | ICD-10-CM | POA: Diagnosis not present

## 2017-10-15 LAB — GLUCOSE, CAPILLARY
GLUCOSE-CAPILLARY: 405 mg/dL — AB (ref 65–99)
Glucose-Capillary: 398 mg/dL — ABNORMAL HIGH (ref 65–99)

## 2017-10-15 LAB — URINALYSIS, ROUTINE W REFLEX MICROSCOPIC
BILIRUBIN URINE: NEGATIVE
Glucose, UA: >=500 mg/dL — AB
Ketones, ur: NEGATIVE mg/dL
NITRITE: NEGATIVE
PROTEIN: 100 mg/dL — AB
Specific Gravity, Urine: 1.015 (ref 1.005–1.030)
pH: 7 (ref 5.0–8.0)

## 2017-10-15 LAB — PROTIME-INR
INR: 1.23
PROTHROMBIN TIME: 15.4 s — AB (ref 11.4–15.2)

## 2017-10-15 LAB — HEMOGLOBIN A1C
Hgb A1c MFr Bld: 7.5 % — ABNORMAL HIGH (ref 4.8–5.6)
Mean Plasma Glucose: 168.55 mg/dL

## 2017-10-15 LAB — CBG MONITORING, ED
GLUCOSE-CAPILLARY: 301 mg/dL — AB (ref 65–99)
Glucose-Capillary: 382 mg/dL — ABNORMAL HIGH (ref 65–99)

## 2017-10-15 MED ORDER — ALBUTEROL SULFATE (2.5 MG/3ML) 0.083% IN NEBU
5.0000 mg | INHALATION_SOLUTION | Freq: Once | RESPIRATORY_TRACT | Status: AC
  Administered 2017-10-15: 5 mg via RESPIRATORY_TRACT
  Filled 2017-10-15: qty 6

## 2017-10-15 MED ORDER — PHENOL 1.4 % MT LIQD
1.0000 | OROMUCOSAL | Status: DC | PRN
  Administered 2017-10-15: 1 via OROMUCOSAL
  Filled 2017-10-15: qty 177

## 2017-10-15 MED ORDER — IPRATROPIUM BROMIDE 0.02 % IN SOLN: 0.5000 mg | Freq: Four times a day (QID) | RESPIRATORY_TRACT | Status: DC

## 2017-10-15 MED ORDER — PREDNISONE 20 MG PO TABS
40.0000 mg | ORAL_TABLET | Freq: Every day | ORAL | Status: DC
  Administered 2017-10-16: 40 mg via ORAL
  Filled 2017-10-15: qty 2

## 2017-10-15 MED ORDER — IPRATROPIUM-ALBUTEROL 0.5-2.5 (3) MG/3ML IN SOLN
3.0000 mL | Freq: Four times a day (QID) | RESPIRATORY_TRACT | Status: DC
  Administered 2017-10-15 (×3): 3 mL via RESPIRATORY_TRACT
  Filled 2017-10-15 (×3): qty 3

## 2017-10-15 MED ORDER — INSULIN NPH (HUMAN) (ISOPHANE) 100 UNIT/ML ~~LOC~~ SUSP
10.0000 [IU] | Freq: Two times a day (BID) | SUBCUTANEOUS | Status: DC
  Administered 2017-10-15 – 2017-10-16 (×4): 10 [IU] via SUBCUTANEOUS
  Filled 2017-10-15 (×2): qty 10

## 2017-10-15 MED ORDER — LEVETIRACETAM 250 MG PO TABS
250.0000 mg | ORAL_TABLET | Freq: Two times a day (BID) | ORAL | Status: DC
  Administered 2017-10-15 – 2017-10-16 (×2): 250 mg via ORAL
  Filled 2017-10-15 (×2): qty 1

## 2017-10-15 MED ORDER — ONDANSETRON HCL 4 MG PO TABS: 4.0000 mg | ORAL_TABLET | Freq: Four times a day (QID) | ORAL | Status: DC | PRN

## 2017-10-15 MED ORDER — NITROGLYCERIN 2 % TD OINT
1.0000 [in_us] | TOPICAL_OINTMENT | Freq: Once | TRANSDERMAL | Status: AC
  Administered 2017-10-15: 1 [in_us] via TOPICAL
  Filled 2017-10-15: qty 1

## 2017-10-15 MED ORDER — MECLIZINE HCL 12.5 MG PO TABS: 25.0000 mg | ORAL_TABLET | Freq: Four times a day (QID) | ORAL | Status: DC | PRN

## 2017-10-15 MED ORDER — CYANOCOBALAMIN 1000 MCG/ML IJ SOLN
1000.0000 ug | INTRAMUSCULAR | Status: DC
  Filled 2017-10-15: qty 1

## 2017-10-15 MED ORDER — METHYLPREDNISOLONE SODIUM SUCC 125 MG IJ SOLR
125.0000 mg | Freq: Once | INTRAMUSCULAR | Status: AC
  Administered 2017-10-15: 125 mg via INTRAVENOUS
  Filled 2017-10-15: qty 2

## 2017-10-15 MED ORDER — METHYLPREDNISOLONE SODIUM SUCC 125 MG IJ SOLR
60.0000 mg | Freq: Four times a day (QID) | INTRAMUSCULAR | Status: DC
  Administered 2017-10-15 (×2): 60 mg via INTRAVENOUS
  Filled 2017-10-15 (×2): qty 2

## 2017-10-15 MED ORDER — AZITHROMYCIN 250 MG PO TABS
500.0000 mg | ORAL_TABLET | Freq: Every day | ORAL | Status: AC
  Administered 2017-10-15: 500 mg via ORAL
  Filled 2017-10-15: qty 2

## 2017-10-15 MED ORDER — ASPIRIN 81 MG PO CHEW
81.0000 mg | CHEWABLE_TABLET | Freq: Every day | ORAL | Status: DC
  Administered 2017-10-15 – 2017-10-16 (×2): 81 mg via ORAL
  Filled 2017-10-15 (×2): qty 1

## 2017-10-15 MED ORDER — ATORVASTATIN CALCIUM 20 MG PO TABS
20.0000 mg | ORAL_TABLET | Freq: Every day | ORAL | Status: DC
  Administered 2017-10-15 – 2017-10-16 (×2): 20 mg via ORAL
  Filled 2017-10-15: qty 1
  Filled 2017-10-15: qty 2

## 2017-10-15 MED ORDER — DM-GUAIFENESIN ER 30-600 MG PO TB12
1.0000 | ORAL_TABLET | Freq: Two times a day (BID) | ORAL | Status: DC
  Administered 2017-10-15 – 2017-10-16 (×4): 1 via ORAL
  Filled 2017-10-15 (×4): qty 1

## 2017-10-15 MED ORDER — INSULIN ASPART 100 UNIT/ML ~~LOC~~ SOLN
0.0000 [IU] | Freq: Every day | SUBCUTANEOUS | Status: DC
  Administered 2017-10-15: 5 [IU] via SUBCUTANEOUS

## 2017-10-15 MED ORDER — AZITHROMYCIN 250 MG PO TABS
250.0000 mg | ORAL_TABLET | Freq: Every day | ORAL | Status: DC
  Administered 2017-10-16: 250 mg via ORAL
  Filled 2017-10-15: qty 1

## 2017-10-15 MED ORDER — IPRATROPIUM-ALBUTEROL 0.5-2.5 (3) MG/3ML IN SOLN
3.0000 mL | Freq: Three times a day (TID) | RESPIRATORY_TRACT | Status: DC
  Administered 2017-10-16 (×2): 3 mL via RESPIRATORY_TRACT
  Filled 2017-10-15 (×3): qty 3

## 2017-10-15 MED ORDER — GUAIFENESIN-DM 100-10 MG/5ML PO SYRP
5.0000 mL | ORAL_SOLUTION | ORAL | Status: DC | PRN
  Administered 2017-10-15: 5 mL via ORAL
  Filled 2017-10-15: qty 5

## 2017-10-15 MED ORDER — INSULIN ASPART 100 UNIT/ML ~~LOC~~ SOLN
0.0000 [IU] | Freq: Three times a day (TID) | SUBCUTANEOUS | Status: DC
  Administered 2017-10-15: 9 [IU] via SUBCUTANEOUS
  Filled 2017-10-15: qty 1

## 2017-10-15 MED ORDER — HYDRALAZINE HCL 20 MG/ML IJ SOLN: 10.0000 mg | Freq: Four times a day (QID) | INTRAMUSCULAR | Status: DC | PRN

## 2017-10-15 MED ORDER — GABAPENTIN 100 MG PO CAPS
100.0000 mg | ORAL_CAPSULE | Freq: Two times a day (BID) | ORAL | Status: DC
  Administered 2017-10-15 – 2017-10-16 (×2): 100 mg via ORAL
  Filled 2017-10-15 (×2): qty 1

## 2017-10-15 MED ORDER — SODIUM CHLORIDE 0.9 % IV SOLN: 250.0000 mL | INTRAVENOUS | Status: DC | PRN

## 2017-10-15 MED ORDER — INSULIN ASPART 100 UNIT/ML ~~LOC~~ SOLN
0.0000 [IU] | Freq: Three times a day (TID) | SUBCUTANEOUS | Status: DC
  Administered 2017-10-16: 15 [IU] via SUBCUTANEOUS
  Administered 2017-10-16: 20 [IU] via SUBCUTANEOUS
  Administered 2017-10-16: 3 [IU] via SUBCUTANEOUS

## 2017-10-15 MED ORDER — ALBUTEROL SULFATE (2.5 MG/3ML) 0.083% IN NEBU: 2.5000 mg | INHALATION_SOLUTION | Freq: Four times a day (QID) | RESPIRATORY_TRACT | Status: DC

## 2017-10-15 MED ORDER — SODIUM CHLORIDE 0.9% FLUSH
3.0000 mL | Freq: Two times a day (BID) | INTRAVENOUS | Status: DC
  Administered 2017-10-15 – 2017-10-16 (×3): 3 mL via INTRAVENOUS

## 2017-10-15 MED ORDER — ONDANSETRON HCL 4 MG/2ML IJ SOLN: 4.0000 mg | Freq: Four times a day (QID) | INTRAMUSCULAR | Status: DC | PRN

## 2017-10-15 MED ORDER — GUAIFENESIN ER 600 MG PO TB12
1200.0000 mg | ORAL_TABLET | Freq: Two times a day (BID) | ORAL | Status: DC
  Administered 2017-10-15 – 2017-10-16 (×2): 1200 mg via ORAL
  Filled 2017-10-15 (×2): qty 2

## 2017-10-15 MED ORDER — HEPARIN SODIUM (PORCINE) 5000 UNIT/ML IJ SOLN
5000.0000 [IU] | Freq: Three times a day (TID) | INTRAMUSCULAR | Status: DC
  Administered 2017-10-15 – 2017-10-16 (×5): 5000 [IU] via SUBCUTANEOUS
  Filled 2017-10-15 (×5): qty 1

## 2017-10-15 MED ORDER — SODIUM CHLORIDE 0.9% FLUSH: 3.0000 mL | INTRAVENOUS | Status: DC | PRN

## 2017-10-15 MED ORDER — TRAMADOL HCL 50 MG PO TABS: 50.0000 mg | ORAL_TABLET | Freq: Four times a day (QID) | ORAL | Status: DC | PRN

## 2017-10-15 MED ORDER — LABETALOL HCL 200 MG PO TABS
300.0000 mg | ORAL_TABLET | Freq: Two times a day (BID) | ORAL | Status: DC
  Administered 2017-10-15 (×2): 300 mg via ORAL
  Filled 2017-10-15 (×3): qty 2

## 2017-10-15 NOTE — ED Notes (Signed)
Pt reporting right lower back pain. Pt states she sat in an uncomfortable position yesterday while at dialysis.

## 2017-10-15 NOTE — Progress Notes (Signed)
Patient admitted to the hospital earlier this morning by Dr. Darrick Meigs  Patient seen and examined. She continues to have mild wheezing bilaterally. Overall shortness of breath improving  Patient admitted to the hospital with progressive shortness of breath, felt to be related to acute bronchitis.  Treated with bronchodilators and intravenous steroids.  Also on antibiotics.  Continue pulmonary hygiene.  Anticipate discharge in next 24 hours if she continues to improve.  Nephrology consulted since she is due for dialysis tomorrow.  [facility]

## 2017-10-15 NOTE — Care Management Obs Status (Signed)
[place] NOTIFICATION   Patient Details  Name: [name] MRN: [id number] Date of Birth: 03/12/1941   Medicare Observation Status Notification Given:  Yes    Sherald Barge, RN 10/15/2017, 12:13 PM

## 2017-10-15 NOTE — H&P (Signed)
TRH H&P    Patient Demographics:    [name], is a 77 y.o. female  MRN: [id number]  DOB - 04/24/1941  Admit Date - 10/14/2017  Referring MD/NP/PA: Dr. Tomi Howard  Outpatient Primary MD for the patient is Barbara Beard, MD  Patient coming from: Home  Chief complaint-cough   HPI:    [name]  is a 76 y.o. female, history of ESRD on hemodialysis Monday Wednesday Friday came to hospital with complaints of cough and shortness of breath.  Patient says that she has had runny nose with sneezing and has been coughing up white phlegm. She denies chest pain, no nausea or vomiting or diarrhea. Denies abdominal pain. In the ED chest x-ray showed no acute abnormality. Patient received albuterol nebulizer, Solu-Medrol with some improvement. She denies fever or chills    Review of systems:      All other systems reviewed and are negative.   With Past History of the following :    Past Medical History:  Diagnosis Date  . Anemia   . Cataract   . Chronic kidney disease   . CVA (cerebral infarction)   . Diabetes mellitus    Type 2  . Diabetes mellitus without complication ([place])   . Dialysis patient ([place])   . Dialysis patient ([place])    M, W, F  . Fistula    R arm  . GERD (gastroesophageal reflux disease)   . Hypertension   . Renal disorder   . Shortness of breath   . Stroke [facility])    right side weakness      Past Surgical History:  Procedure Laterality Date  . AV FISTULA PLACEMENT Right 09/08/2013   Procedure: CREATION OF RIGHT BRACHIAL CEPHALIC ARTERIOVENOUS FISTULA ;  Surgeon: Mal Misty, MD;  Location: [place];  Service: Vascular;  Laterality: Right;  . BASCILIC VEIN TRANSPOSITION Right 01/26/2014   Procedure: Right Arm BASILIC VEIN TRANSPOSITION;  Surgeon: Mal Misty, MD;  Location: [place];  Service: Vascular;  Laterality: Right;  . CATARACT EXTRACTION W/PHACO  11/20/2011   Procedure:  CATARACT EXTRACTION PHACO AND INTRAOCULAR LENS PLACEMENT (IOC);  Surgeon: Tonny Branch, MD;  Location: [facility];  Service: Ophthalmology;  Laterality: Right;  CDE 18.82  . CATARACT EXTRACTION W/PHACO Left 11/18/2012   Procedure: CATARACT EXTRACTION PHACO AND INTRAOCULAR LENS PLACEMENT (IOC);  Surgeon: Tonny Branch, MD;  Location: [facility];  Service: Ophthalmology;  Laterality: Left;  CDE: 18.97  . COLONOSCOPY N/A 02/09/2013   Procedure: COLONOSCOPY;  Surgeon: Rogene Houston, MD;  [facility];  Service: Endoscopy;  Laterality: N/A;  305-moved to 220 Ann to notify pt  . INSERTION OF DIALYSIS CATHETER Right 06/24/2013   Procedure: INSERTION OF DIALYSIS CATHETER: Ultrasound guided;  Surgeon: Serafina Mitchell, MD;  Location: [place];  Service: Vascular;  Laterality: Right;  . LIGATION OF ARTERIOVENOUS  FISTULA Right 01/26/2014   Procedure: LIGATION OF ARTERIOVENOUS  FISTULA;  Surgeon: Mal Misty, MD;  Location: [place];  Service: Vascular;  Laterality: Right;  . 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 LOOP RECORDER IMPLANT N/A 07/21/2013   Procedure: LOOP RECORDER IMPLANT;  Surgeon: Evans Lance, MD;  Location: [facility] CATH LAB;  Service: Cardiovascular;  Laterality: N/A;  . TEE WITHOUT CARDIOVERSION N/A 07/21/2013   Procedure: TRANSESOPHAGEAL ECHOCARDIOGRAM (TEE);  Surgeon: Dorothy Spark, MD;  Location: [facility] ENDOSCOPY;  Service: Cardiovascular;  Laterality: N/A;      Social History:      Social History   Tobacco Use  . Smoking status: Never Smoker  . Smokeless tobacco: Never Used  Substance Use Topics  . Alcohol use: No       Family History :     Family History  Problem Relation Age of Onset  . Cancer Sister   . Cancer Brother   . Anesthesia problems Neg Hx   . Hypotension Neg Hx   . Malignant hyperthermia Neg Hx   . Pseudochol deficiency Neg Hx       Home Medications:   Prior to Admission medications   Medication Sig Start Date  End Date Taking? Authorizing Provider  albuterol (PROVENTIL HFA;VENTOLIN HFA) 108 (90 BASE) MCG/ACT inhaler Inhale 1-2 puffs into the lungs every 6 (six) hours as needed for wheezing. Use 2 puffs 3 times daily x 4 days then as needed. 06/03/15   Barbara Filler, MD  amoxicillin-clavulanate (AUGMENTIN) 500-125 MG tablet Take 1 tablet (500 mg total) by mouth daily. Take for 4 days then stop. Patient not taking: Reported on 04/08/2016 06/03/15   Barbara Filler, MD  aspirin 81 MG chewable tablet Chew 81 mg by mouth daily.    [provider]  atorvastatin (LIPITOR) 20 MG tablet Take 1 tablet by mouth daily. 11/09/14   [provider]  ciprofloxacin (CIPRO) 500 MG tablet Take 1 tablet (500 mg total) by mouth daily. Take after dialysis 11/20/16   Barbara Greek, MD  cyanocobalamin (,VITAMIN B-12,) 1000 MCG/ML injection Inject 1 mL into the muscle every 30 (thirty) days. 11/01/14   [provider]  fluticasone (FLONASE) 50 MCG/ACT nasal spray Place 2 sprays into both nostrils daily. Use for 5 days then as needed. Patient not taking: Reported on 04/08/2016 06/03/15   Barbara Filler, MD  guaiFENesin (MUCINEX) 600 MG 12 hr tablet Take 2 tablets (1,200 mg total) by mouth 2 (two) times daily. Take for 5 days then stop. Patient not taking: Reported on 04/08/2016 06/03/15   Barbara Filler, MD  ibuprofen (ADVIL,MOTRIN) 200 MG tablet Take 200-400 mg by mouth every 6 (six) hours as needed for moderate pain.     [provider]  insulin NPH Human (HUMULIN N,NOVOLIN N) 100 UNIT/ML injection Inject 10 Units into the skin 2 (two) times daily before a meal.    [provider]  labetalol (NORMODYNE) 300 MG tablet Take 300 mg by mouth 2 (two) times daily. 11/06/14   [provider]  meclizine (ANTIVERT) 25 MG tablet Take 1 tablet by mouth every 6 (six) hours as needed for dizziness.  11/07/14   [provider]  metroNIDAZOLE (FLAGYL) 500 MG  tablet Take 1 tablet (500 mg total) by mouth 3 (three) times daily. 11/20/16   Barbara Greek, MD  multivitamin (RENA-VIT) TABS tablet Take 1 tablet by mouth at bedtime. 08/21/14   Kirsteins, Luanna Salk, MD  predniSONE (DELTASONE) 20 MG tablet Take 1-3 tablets (20-60 mg total) by mouth daily before breakfast. Take 3 tablets (60mg ) daily x 1 day,  then 2 tablets (40mg )daily x 3 days, then 1 tablet (20mg )daily x 3 days then stop. Patient not taking: Reported on 04/08/2016 06/03/15   Barbara Filler, MD  traMADol (ULTRAM) 50 MG tablet Take 1 tablet (50 mg total) by mouth every 6 (six) hours as needed. 11/20/16   Barbara Greek, MD     Allergies:     Allergies  Allergen Reactions  . Ambien [Zolpidem] Other (See Comments)    Hallucinations   . Reglan [Metoclopramide] Other (See Comments)    Hallucinations   . Reglan [Metoclopramide]     "makes me crazy"     Physical Exam:   Vitals  Blood pressure (!) 223/87, pulse 92, temperature 99.8 F (37.7 C), temperature source Oral, resp. rate 18, height 5\' 7"  (1.702 m), weight 81.6 kg (180 lb), SpO2 100 %.  1.  General: Appears in no acute distress  2. Psychiatric:  Intact judgement and  insight, awake alert, oriented x 3.  3. Neurologic: No focal neurological deficits, all cranial nerves intact.Strength 5/5 all 4 extremities, sensation intact all 4 extremities, plantars down going.  4. Eyes :  anicteric sclerae, moist conjunctivae with no lid lag. PERRLA.  5. ENMT:  Oropharynx clear with moist mucous membranes and good dentition  6. Neck:  supple, no cervical lymphadenopathy appriciated, No thyromegaly  7. Respiratory : Normal respiratory effort, bilateral wheezing auscultated  8. Cardiovascular : RRR, no gallops, rubs or murmurs, no leg edema  9. Gastrointestinal:  Positive bowel sounds, abdomen soft, non-tender to palpation,no hepatosplenomegaly, no rigidity or guarding       10. Skin:  No cyanosis, normal texture  and turgor, no rash, lesions or ulcers  11.Musculoskeletal:  Good muscle tone,  joints appear normal , no effusions,  normal range of motion    Data Review:    CBC Recent Labs  Lab 10/14/17 2301  WBC 15.8*  HGB 10.1*  HCT 31.3*  PLT 135*  MCV 96.6  MCH 31.2  MCHC 32.3  RDW 13.9  LYMPHSABS 0.6*  MONOABS 1.9*  EOSABS 0.0  BASOSABS 0.0   ------------------------------------------------------------------------------------------------------------------  Chemistries  Recent Labs  Lab 10/14/17 2301  NA 139  K 4.8  CL 99*  CO2 25  GLUCOSE 255*  BUN 21*  CREATININE 5.74*  CALCIUM 8.6*  AST 24  ALT 17  ALKPHOS 63  BILITOT 0.8   ------------------------------------------------------------------------------------------------------------------  ------------------------------------------------------------------------------------------------------------------ GFR: Estimated Creatinine Clearance: 8.9 mL/min (A) (by C-G formula based on SCr of 5.74 mg/dL (H)). Liver Function Tests: Recent Labs  Lab 10/14/17 2301  AST 24  ALT 17  ALKPHOS 63  BILITOT 0.8  PROT 8.2*  ALBUMIN 3.7   No results for input(s): LIPASE, AMYLASE in the last 168 hours. No results for input(s): AMMONIA in the last 168 hours. Coagulation Profile: Recent Labs  Lab 10/14/17 2301  INR 1.23    --------------------------------------------------------------------------------------------------------------- Urine analysis:    Component Value Date/Time   COLORURINE YELLOW 11/19/2014 0909   APPEARANCEUR CLEAR 11/19/2014 0909   LABSPEC 1.020 11/19/2014 0909   PHURINE 7.5 11/19/2014 0909   GLUCOSEU 100 (A) 11/19/2014 0909   HGBUR MODERATE (A) 11/19/2014 0909   BILIRUBINUR NEGATIVE 11/19/2014 0909   KETONESUR TRACE (A) 11/19/2014 0909   PROTEINUR >300 (A) 11/19/2014 0909   UROBILINOGEN 0.2 11/19/2014 0909   NITRITE NEGATIVE 11/19/2014 0909   LEUKOCYTESUR NEGATIVE 11/19/2014 0909       Imaging Results:    Dg Chest 2 View  Result Date: 10/14/2017 CLINICAL DATA:  Cough  and fever EXAM: CHEST - 2 VIEW COMPARISON:  Chest radiograph 08/26/2017 FINDINGS: Unchanged cardiomegaly. No pulmonary edema or focal airspace consolidation. No pleural effusion or pneumothorax. IMPRESSION: Unchanged cardiomegaly without acute airspace disease. Electronically Signed   By: Ulyses Jarred M.D.   On: 10/14/2017 23:49       Assessment & Plan:    Active Problems:   Acute bronchitis   1. Acute bronchitis-patient presenting with coughing with wheezing.  Will start Solu-Medrol 60 mg IV every 6 hours, DuoNeb nebulizers every 6 hours, Mucinex DM as needed 2. Diabetes mellitus-initiate sliding scale insulin with NovoLog, continue NPH 10 units subcu twice a day  3. Hypertension-blood pressure is elevated, continue home medications including labetalol 300 mg p.o. twice daily 4. ESRD-patient is on hemodialysis Monday Wednesday Friday, will consult nephrology for hemodialysis.    DVT Prophylaxis-   Heparin  AM Labs Ordered, also please review Full Orders  Family Communication: Admission, patients condition and plan of care including tests being ordered have been discussed with the patient  who indicate understanding and agree with the plan and Code Status.  Code Status: Full code  Admission status: Observation  Time spent in minutes : 60 minutes   Oswald Hillock M.D on 10/15/2017 at 6:01 AM  Between 7am to 7pm - Pager - [phone]. After 7pm go to [email or web address] - password [facility]  [facility] - Office  [phone]

## 2017-10-15 NOTE — ED Notes (Signed)
Pt states she cannot urinate at this time.

## 2017-10-15 NOTE — ED Notes (Signed)
Dialysis M,W,F.

## 2017-10-16 DIAGNOSIS — N186 End stage renal disease: Secondary | ICD-10-CM | POA: Diagnosis not present

## 2017-10-16 DIAGNOSIS — I10 Essential (primary) hypertension: Secondary | ICD-10-CM | POA: Diagnosis not present

## 2017-10-16 DIAGNOSIS — E785 Hyperlipidemia, unspecified: Secondary | ICD-10-CM

## 2017-10-16 DIAGNOSIS — J209 Acute bronchitis, unspecified: Secondary | ICD-10-CM | POA: Diagnosis not present

## 2017-10-16 DIAGNOSIS — R05 Cough: Secondary | ICD-10-CM | POA: Diagnosis not present

## 2017-10-16 DIAGNOSIS — E118 Type 2 diabetes mellitus with unspecified complications: Secondary | ICD-10-CM

## 2017-10-16 DIAGNOSIS — Z992 Dependence on renal dialysis: Secondary | ICD-10-CM | POA: Diagnosis not present

## 2017-10-16 DIAGNOSIS — R0602 Shortness of breath: Secondary | ICD-10-CM | POA: Diagnosis not present

## 2017-10-16 LAB — CBC
HCT: 30.6 % — ABNORMAL LOW (ref 36.0–46.0)
Hemoglobin: 10.1 g/dL — ABNORMAL LOW (ref 12.0–15.0)
MCH: 30.9 pg (ref 26.0–34.0)
MCHC: 33 g/dL (ref 30.0–36.0)
MCV: 93.6 fL (ref 78.0–100.0)
PLATELETS: 142 10*3/uL — AB (ref 150–400)
RBC: 3.27 MIL/uL — ABNORMAL LOW (ref 3.87–5.11)
RDW: 13.6 % (ref 11.5–15.5)
WBC: 26 10*3/uL — ABNORMAL HIGH (ref 4.0–10.5)

## 2017-10-16 LAB — COMPREHENSIVE METABOLIC PANEL
ALBUMIN: 3.4 g/dL — AB (ref 3.5–5.0)
ALK PHOS: 65 U/L (ref 38–126)
ALT: 23 U/L (ref 14–54)
ANION GAP: 16 — AB (ref 5–15)
AST: 29 U/L (ref 15–41)
BUN: 60 mg/dL — ABNORMAL HIGH (ref 6–20)
CALCIUM: 8.3 mg/dL — AB (ref 8.9–10.3)
CHLORIDE: 95 mmol/L — AB (ref 101–111)
CO2: 21 mmol/L — AB (ref 22–32)
Creatinine, Ser: 8.35 mg/dL — ABNORMAL HIGH (ref 0.44–1.00)
GFR calc Af Amer: 5 mL/min — ABNORMAL LOW (ref >60)
GFR calc non Af Amer: 4 mL/min — ABNORMAL LOW (ref >60)
GLUCOSE: 357 mg/dL — AB (ref 65–99)
POTASSIUM: 5.7 mmol/L — AB (ref 3.5–5.1)
SODIUM: 132 mmol/L — AB (ref 135–145)
Total Bilirubin: 1 mg/dL (ref 0.3–1.2)
Total Protein: 7.6 g/dL (ref 6.5–8.1)

## 2017-10-16 LAB — GLUCOSE, CAPILLARY
GLUCOSE-CAPILLARY: 386 mg/dL — AB (ref 65–99)
Glucose-Capillary: 330 mg/dL — ABNORMAL HIGH (ref 65–99)
Glucose-Capillary: 123 mg/dL — ABNORMAL HIGH (ref 65–99)

## 2017-10-16 LAB — MRSA PCR SCREENING: MRSA by PCR: NEGATIVE

## 2017-10-16 MED ORDER — GUAIFENESIN ER 600 MG PO TB12: 600.0000 mg | ORAL_TABLET | Freq: Two times a day (BID) | ORAL | 0 refills | Status: DC

## 2017-10-16 MED ORDER — SODIUM CHLORIDE 0.9 % IV SOLN: 100.0000 mL | INTRAVENOUS | Status: DC | PRN

## 2017-10-16 MED ORDER — PENTAFLUOROPROP-TETRAFLUOROETH EX AERO: 1.0000 "application " | INHALATION_SPRAY | CUTANEOUS | Status: DC | PRN

## 2017-10-16 MED ORDER — MENTHOL 3 MG MT LOZG: 1.0000 | LOZENGE | OROMUCOSAL | Status: DC | PRN

## 2017-10-16 MED ORDER — LIDOCAINE-PRILOCAINE 2.5-2.5 % EX CREA: 1.0000 "application " | TOPICAL_CREAM | CUTANEOUS | Status: DC | PRN

## 2017-10-16 MED ORDER — ALBUTEROL SULFATE HFA 108 (90 BASE) MCG/ACT IN AERS: 2.0000 | INHALATION_SPRAY | Freq: Four times a day (QID) | RESPIRATORY_TRACT | 2 refills | Status: DC | PRN

## 2017-10-16 MED ORDER — LIDOCAINE HCL (PF) 1 % IJ SOLN: 5.0000 mL | INTRAMUSCULAR | Status: DC | PRN

## 2017-10-16 MED ORDER — PREDNISONE 10 MG PO TABS: ORAL_TABLET | ORAL | 0 refills | Status: DC

## 2017-10-16 MED ORDER — AZITHROMYCIN 250 MG PO TABS: 250.0000 mg | ORAL_TABLET | Freq: Every day | ORAL | 0 refills | Status: DC

## 2017-10-16 NOTE — Discharge Summary (Signed)
Physician Discharge Summary  DARIEN MIGNOGNA ZOX:[id number] DOB: 15-Apr-1941 DOA: 10/14/2017  PCP: [name], MD  Admit date: 10/14/2017 Discharge date: 10/16/2017  Admitted From: Home Disposition: Home  Recommendations for Outpatient Follow-up:  1. Follow up with PCP in 1-2 weeks 2. Please obtain BMP/CBC in one week  Home Health: Equipment/Devices:  Discharge Condition: Stable CODE STATUS: Full code Diet recommendation: Heart Healthy / Carb Modifieda   Brief/Interim Summary: 77 year old female with history of diabetes and end-stage renal disease on hemodialysis, presents to the hospital with progressive shortness of breath.  She did not have any evidence of volume overload, but was coughing and wheezing.  It was felt that she likely has an acute bronchitis.  She was treated with intravenous steroids, antibiotics and bronchodilators.  Her shortness of breath has improved.  Wheezing has significantly improved.  She is not on any oxygen and is feeling that she is approaching baseline.  She was seen by nephrology and underwent dialysis on the day of discharge.  She will be continued on a prednisone taper, will receive an albuterol inhaler as well as a course of azithromycin.  She will also be continued on mucolytic's.  The patient is stable for discharge home.  Discharge Diagnoses:  Active Problems:   HLD (hyperlipidemia)   Diabetes mellitus with complication (HCC)   ESRD (end stage renal disease) on dialysis [facility])   Acute bronchitis    Discharge Instructions  Discharge Instructions    Diet - low sodium heart healthy   Complete by:  As directed    Increase activity slowly   Complete by:  As directed      Allergies as of 10/16/2017      Reactions   Ambien [zolpidem] Other (See Comments)   Hallucinations    Reglan [metoclopramide] Other (See Comments)   Hallucinations    Reglan [metoclopramide]    "makes me crazy"      Medication List    TAKE these medications    albuterol 108 (90 Base) MCG/ACT inhaler Commonly known as:  PROVENTIL HFA;VENTOLIN HFA Inhale 2 puffs into the lungs every 6 (six) hours as needed for wheezing or shortness of breath.   aspirin 81 MG chewable tablet Chew 81 mg by mouth daily.   atorvastatin 20 MG tablet Commonly known as:  LIPITOR Take 1 tablet by mouth daily.   azithromycin 250 MG tablet Commonly known as:  ZITHROMAX Take 1 tablet (250 mg total) by mouth daily.   cyanocobalamin 1000 MCG/ML injection Commonly known as:  (VITAMIN B-12) Inject 1 mL into the muscle every 30 (thirty) days.   gabapentin 100 MG capsule Commonly known as:  NEURONTIN Take 1 capsule by mouth 2 (two) times daily.   guaiFENesin 600 MG 12 hr tablet Commonly known as:  MUCINEX Take 1 tablet (600 mg total) by mouth 2 (two) times daily. What changed:    how much to take  additional instructions   ibuprofen 200 MG tablet Commonly known as:  ADVIL,MOTRIN Take 200-400 mg by mouth every 6 (six) hours as needed for moderate pain.   insulin NPH Human 100 UNIT/ML injection Commonly known as:  HUMULIN N,NOVOLIN N Inject 16 Units into the skin 2 (two) times daily before a meal.   labetalol 300 MG tablet Commonly known as:  NORMODYNE Take 300 mg by mouth 2 (two) times daily.   levETIRAcetam 250 MG tablet Commonly known as:  KEPPRA Take 1 tablet by mouth 2 (two) times daily.   lidocaine-prilocaine cream Commonly known as:  EMLA APPLY 45 MINUTES PRIOR TO DIALYSIS ON MON,WED,FRI   multivitamin Tabs tablet Take 1 tablet by mouth at bedtime.   predniSONE 10 MG tablet Commonly known as:  DELTASONE Take 40mg  po daily for 2 days then 30mg  daily for 2 days then 20mg  daily for 2 days then 10mg  daily for 2 days then stop       Allergies  Allergen Reactions  . Ambien [Zolpidem] Other (See Comments)    Hallucinations   . Reglan [Metoclopramide] Other (See Comments)    Hallucinations   . Reglan [Metoclopramide]     "makes me crazy"     Consultations:  Nephrology   Procedures/Studies: Dg Chest 2 View  Result Date: 10/14/2017 CLINICAL DATA:  Cough and fever EXAM: CHEST - 2 VIEW COMPARISON:  Chest radiograph 08/26/2017 FINDINGS: Unchanged cardiomegaly. No pulmonary edema or focal airspace consolidation. No pleural effusion or pneumothorax. IMPRESSION: Unchanged cardiomegaly without acute airspace disease. Electronically Signed   By: Ulyses Jarred M.D.   On: 10/14/2017 23:49       Subjective: Feeling better today.  Shortness of breath is better.  Continues to have mild wheeze, but overall feels improved  Discharge Exam: Vitals:   10/16/17 1830 10/16/17 1900  BP: (!) 167/74 (!) 167/78  Pulse: 84 83  Resp:    Temp:    SpO2:     Vitals:   10/16/17 1730 10/16/17 1800 10/16/17 1830 10/16/17 1900  BP: (!) 192/96 (!) 161/80 (!) 167/74 (!) 167/78  Pulse: 83 85 84 83  Resp:      Temp:      TempSrc:      SpO2:      Weight:      Height:        General: Pt is alert, awake, not in acute distress Cardiovascular: RRR, S1/S2 +, no rubs, no gallops Respiratory: Mild wheeze bilaterally Abdominal: Soft, NT, ND, bowel sounds + Extremities: no edema, no cyanosis    The results of significant diagnostics from this hospitalization (including imaging, microbiology, ancillary and laboratory) are listed below for reference.     Microbiology: Recent Results (from the past 240 hour(s))  Culture, blood (Routine x 2)     Status: None (Preliminary result)   Collection Time: 10/14/17 10:59 PM  Result Value Ref Range Status   Specimen Description BLOOD LEFT ARM  Final   Special Requests   Final    BOTTLES DRAWN AEROBIC AND ANAEROBIC Blood Culture adequate volume   Culture   Final    NO GROWTH 2 DAYS Performed at [facility], [address] [place], [place] [address]    Report Status PENDING  Incomplete  Culture, blood (Routine x 2)     Status: None (Preliminary result)   Collection Time: 10/14/17 11:10 PM  Result  Value Ref Range Status   Specimen Description BLOOD LEFT HAND  Final   Special Requests   Final    BOTTLES DRAWN AEROBIC AND ANAEROBIC Blood Culture adequate volume   Culture   Final    NO GROWTH 2 DAYS Performed at [facility], [address], [place], [place] [address]    Report Status PENDING  Incomplete  MRSA PCR Screening     Status: None   Collection Time: 10/16/17  1:00 AM  Result Value Ref Range Status   MRSA by PCR NEGATIVE NEGATIVE Final    Comment:        The GeneXpert MRSA Assay (FDA approved for NASAL specimens only), is one component of a  comprehensive MRSA colonization surveillance program. It is not intended to diagnose MRSA infection nor to guide or monitor treatment for MRSA infections. Performed at [facility], [address], [place], [place] [address]      Labs: BNP (last 3 results) No results for input(s): BNP in the last 8760 hours. Basic Metabolic Panel: Recent Labs  Lab 10/14/17 2301 10/16/17 0542  NA 139 132*  K 4.8 5.7*  CL 99* 95*  CO2 25 21*  GLUCOSE 255* 357*  BUN 21* 60*  CREATININE 5.74* 8.35*  CALCIUM 8.6* 8.3*   Liver Function Tests: Recent Labs  Lab 10/14/17 2301 10/16/17 0542  AST 24 29  ALT 17 23  ALKPHOS 63 65  BILITOT 0.8 1.0  PROT 8.2* 7.6  ALBUMIN 3.7 3.4*   No results for input(s): LIPASE, AMYLASE in the last 168 hours. No results for input(s): AMMONIA in the last 168 hours. CBC: Recent Labs  Lab 10/14/17 2301 10/16/17 0542  WBC 15.8* 26.0*  NEUTROABS 13.2*  --   HGB 10.1* 10.1*  HCT 31.3* 30.6*  MCV 96.6 93.6  PLT 135* 142*   Cardiac Enzymes: No results for input(s): CKTOTAL, CKMB, CKMBINDEX, TROPONINI in the last 168 hours. BNP: Invalid input(s): POCBNP CBG: Recent Labs  Lab 10/15/17 1704 10/15/17 2133 10/16/17 0734 10/16/17 1136 10/16/17 1638  GLUCAP 398* 405* 330* 386* 123*   D-Dimer No results for input(s): DDIMER in the last 72 hours. Hgb A1c Recent Labs    10/14/17 2301  HGBA1C  7.5*   Lipid Profile No results for input(s): CHOL, HDL, LDLCALC, TRIG, CHOLHDL, LDLDIRECT in the last 72 hours. Thyroid function studies No results for input(s): TSH, T4TOTAL, T3FREE, THYROIDAB in the last 72 hours.  Invalid input(s): FREET3 Anemia work up No results for input(s): VITAMINB12, FOLATE, FERRITIN, TIBC, IRON, RETICCTPCT in the last 72 hours. Urinalysis    Component Value Date/Time   COLORURINE YELLOW 10/15/2017 2244   APPEARANCEUR HAZY (A) 10/15/2017 2244   LABSPEC 1.015 10/15/2017 2244   PHURINE 7.0 10/15/2017 2244   GLUCOSEU >=500 (A) 10/15/2017 2244   HGBUR SMALL (A) 10/15/2017 2244   BILIRUBINUR NEGATIVE 10/15/2017 2244   KETONESUR NEGATIVE 10/15/2017 2244   PROTEINUR 100 (A) 10/15/2017 2244   UROBILINOGEN 0.2 11/19/2014 0909   NITRITE NEGATIVE 10/15/2017 2244   LEUKOCYTESUR MODERATE (A) 10/15/2017 2244   Sepsis Labs Invalid input(s): PROCALCITONIN,  WBC,  LACTICIDVEN Microbiology Recent Results (from the past 240 hour(s))  Culture, blood (Routine x 2)     Status: None (Preliminary result)   Collection Time: 10/14/17 10:59 PM  Result Value Ref Range Status   Specimen Description BLOOD LEFT ARM  Final   Special Requests   Final    BOTTLES DRAWN AEROBIC AND ANAEROBIC Blood Culture adequate volume   Culture   Final    NO GROWTH 2 DAYS Performed at [facility], [address] [place], [place] [address]    Report Status PENDING  Incomplete  Culture, blood (Routine x 2)     Status: None (Preliminary result)   Collection Time: 10/14/17 11:10 PM  Result Value Ref Range Status   Specimen Description BLOOD LEFT HAND  Final   Special Requests   Final    BOTTLES DRAWN AEROBIC AND ANAEROBIC Blood Culture adequate volume   Culture   Final    NO GROWTH 2 DAYS Performed at [facility], [address] [place], [place] [address]    Report Status PENDING  Incomplete  MRSA PCR Screening  Status: None   Collection Time: 10/16/17  1:00 AM  Result Value Ref Range  Status   MRSA by PCR NEGATIVE NEGATIVE Final    Comment:        The GeneXpert MRSA Assay (FDA approved for NASAL specimens only), is one component of a comprehensive MRSA colonization surveillance program. It is not intended to diagnose MRSA infection nor to guide or monitor treatment for MRSA infections. Performed at [facility], [address] [place], [place] [address]      Time coordinating discharge: 47mins  SIGNED:   Kathie Dike, MD  [facility] 10/16/2017, 7:22 PM Pager   If 7PM-7AM, please contact night-coverage [email or web address] Password [email or web address]

## 2017-10-16 NOTE — Procedures (Signed)
     HEMODIALYSIS TREATMENT NOTE:   3.5 hour heparin-free dialysis completed via right upper arm AVF (15g/antegrade). Goal met: 2.6 liters removed without interruption in ultrafiltration.  Hemodynamically stable throughout HD session.  All blood was returned and hemostasis was achieved in 15 minutes.   PIV removed from left forearm, intact.  Discharge instructions reviewed.  Orthostatic vitals unremarkable.  Pt ambulated from room 306 to nurses station and back (with cane) with steady gait.  Report given to Linde Gillis, RN.  Rockwell Alexandria, RN, CDN

## 2017-10-16 NOTE — Consult Note (Signed)
Reason for Consult: End-stage renal disease Referring Physician: Dr. Delton See [name] is an 77 y.o. female.  HPI: She is a patient who has history of diabetes, hypertension, CVA, end-stage renal disease on maintenance hemodialysis presently came with complaints of cough with whitish sputum production and difficulty breathing.  When she was evaluated patient was found to have bronchitis and admitted to the hospital.  Presently patient was given some inhaler and steroid feeling much better.  Patient denies any fever, chills or sweating.  Patient also denies any orthopnea.  Past Medical History:  Diagnosis Date  . Anemia   . Cataract   . Chronic kidney disease   . CVA (cerebral infarction)   . Diabetes mellitus    Type 2  . Diabetes mellitus without complication ([place])   . Dialysis patient ([place])   . Dialysis patient ([place])    M, W, F  . Fistula    R arm  . GERD (gastroesophageal reflux disease)   . Hypertension   . Renal disorder   . Shortness of breath   . Stroke [facility])    right side weakness    Past Surgical History:  Procedure Laterality Date  . AV FISTULA PLACEMENT Right 09/08/2013   Procedure: CREATION OF RIGHT BRACHIAL CEPHALIC ARTERIOVENOUS FISTULA ;  Surgeon: Mal Misty, MD;  Location: [place];  Service: Vascular;  Laterality: Right;  . BASCILIC VEIN TRANSPOSITION Right 01/26/2014   Procedure: Right Arm BASILIC VEIN TRANSPOSITION;  Surgeon: Mal Misty, MD;  Location: [place];  Service: Vascular;  Laterality: Right;  . CATARACT EXTRACTION W/PHACO  11/20/2011   Procedure: CATARACT EXTRACTION PHACO AND INTRAOCULAR LENS PLACEMENT (IOC);  Surgeon: Tonny Branch, MD;  Location: [facility];  Service: Ophthalmology;  Laterality: Right;  CDE 18.82  . CATARACT EXTRACTION W/PHACO Left 11/18/2012   Procedure: CATARACT EXTRACTION PHACO AND INTRAOCULAR LENS PLACEMENT (IOC);  Surgeon: Tonny Branch, MD;  Location: [facility];  Service: Ophthalmology;  Laterality: Left;  CDE: 18.97  . COLONOSCOPY  N/A 02/09/2013   Procedure: COLONOSCOPY;  Surgeon: Rogene Houston, MD;  [facility];  Service: Endoscopy;  Laterality: N/A;  305-moved to 220 Ann to notify pt  . INSERTION OF DIALYSIS CATHETER Right 06/24/2013   Procedure: INSERTION OF DIALYSIS CATHETER: Ultrasound guided;  Surgeon: Serafina Mitchell, MD;  Location: [place];  Service: Vascular;  Laterality: Right;  . LIGATION OF ARTERIOVENOUS  FISTULA Right 01/26/2014   Procedure: LIGATION OF ARTERIOVENOUS  FISTULA;  Surgeon: Mal Misty, MD;  Location: [place];  Service: Vascular;  Laterality: Right;  . 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 LOOP RECORDER IMPLANT N/A 07/21/2013   Procedure: LOOP RECORDER IMPLANT;  Surgeon: Evans Lance, MD;  Location: [facility] CATH LAB;  Service: Cardiovascular;  Laterality: N/A;  . TEE WITHOUT CARDIOVERSION N/A 07/21/2013   Procedure: TRANSESOPHAGEAL ECHOCARDIOGRAM (TEE);  Surgeon: Dorothy Spark, MD;  Location: [facility] ENDOSCOPY;  Service: Cardiovascular;  Laterality: N/A;    Family History  Problem Relation Age of Onset  . Cancer Sister   . Cancer Brother   . Anesthesia problems Neg Hx   . Hypotension Neg Hx   . Malignant hyperthermia Neg Hx   . Pseudochol deficiency Neg Hx     Social History:  reports that she has never smoked. She has never used smokeless tobacco. She reports that she does not drink alcohol or use drugs.  Allergies:  Allergies  Allergen Reactions  . Ambien [Zolpidem] Other (See Comments)    Hallucinations   . Reglan [Metoclopramide] Other (See Comments)    Hallucinations   . Reglan [Metoclopramide]     "makes me crazy"    Medications: I have reviewed the patient's current medications.  Results for orders placed or performed during the hospital encounter of 10/14/17 (from the past 48 hour(s))  Culture, blood (Routine x 2)     Status: None (Preliminary result)   Collection Time: 10/14/17 10:59 PM  Result Value Ref Range    Specimen Description BLOOD LEFT ARM    Special Requests      BOTTLES DRAWN AEROBIC AND ANAEROBIC Blood Culture adequate volume   Culture      NO GROWTH < 24 HOURS Performed at [facility], [address], [place], [place] [address]    Report Status PENDING   Comprehensive metabolic panel     Status: Abnormal   Collection Time: 10/14/17 11:01 PM  Result Value Ref Range   Sodium 139 135 - 145 mmol/L   Potassium 4.8 3.5 - 5.1 mmol/L   Chloride 99 (L) 101 - 111 mmol/L   CO2 25 22 - 32 mmol/L   Glucose, Bld 255 (H) 65 - 99 mg/dL   BUN 21 (H) 6 - 20 mg/dL   Creatinine, Ser 5.74 (H) 0.44 - 1.00 mg/dL   Calcium 8.6 (L) 8.9 - 10.3 mg/dL   Total Protein 8.2 (H) 6.5 - 8.1 g/dL   Albumin 3.7 3.5 - 5.0 g/dL   AST 24 15 - 41 U/L   ALT 17 14 - 54 U/L   Alkaline Phosphatase 63 38 - 126 U/L   Total Bilirubin 0.8 0.3 - 1.2 mg/dL   GFR calc non Af Amer 6 (L) >60 mL/min   GFR calc Af Amer 7 (L) >60 mL/min    Comment: (NOTE) The eGFR has been calculated using the CKD EPI equation. This calculation has not been validated in all clinical situations. eGFR's persistently <60 mL/min signify possible Chronic Kidney Disease.    Anion gap 15 5 - 15    Comment: Performed at [facility], [address] [place], [place] [address]  CBC with Differential     Status: Abnormal   Collection Time: 10/14/17 11:01 PM  Result Value Ref Range   WBC 15.8 (H) 4.0 - 10.5 K/uL   RBC 3.24 (L) 3.87 - 5.11 MIL/uL   Hemoglobin 10.1 (L) 12.0 - 15.0 g/dL   HCT 31.3 (L) 36.0 - 46.0 %   MCV 96.6 78.0 - 100.0 fL   MCH 31.2 26.0 - 34.0 pg   MCHC 32.3 30.0 - 36.0 g/dL   RDW 13.9 11.5 - 15.5 %   Platelets 135 (L) 150 - 400 K/uL   Neutrophils Relative % 84 %   Neutro Abs 13.2 (H) 1.7 - 7.7 K/uL   Lymphocytes Relative 4 %   Lymphs Abs 0.6 (L) 0.7 - 4.0 K/uL   Monocytes Relative 12 %   Monocytes Absolute 1.9 (H) 0.1 - 1.0 K/uL   Eosinophils Relative 0 %   Eosinophils Absolute 0.0 0.0 - 0.7 K/uL   Basophils Relative 0 %    Basophils Absolute 0.0 0.0 - 0.1 K/uL    Comment: Performed at [facility], [address] [place], [place] [address]  Protime-INR     Status: Abnormal   Collection Time: 10/14/17 11:01 PM  Result Value Ref Range   Prothrombin Time 15.4 (H) 11.4 - 15.2 seconds  INR 1.23     Comment: Performed at [facility], [address] [place], [place] [address]  Hemoglobin A1c     Status: Abnormal   Collection Time: 10/14/17 11:01 PM  Result Value Ref Range   Hgb A1c MFr Bld 7.5 (H) 4.8 - 5.6 %    Comment: (NOTE) Pre diabetes:          5.7%-6.4% Diabetes:              >6.4% Glycemic control for   <7.0% adults with diabetes    Mean Plasma Glucose 168.55 mg/dL    Comment: Performed at [facility] [address], [place], [place] [address]  Culture, blood (Routine x 2)     Status: None (Preliminary result)   Collection Time: 10/14/17 11:10 PM  Result Value Ref Range   Specimen Description BLOOD LEFT HAND    Special Requests      BOTTLES DRAWN AEROBIC AND ANAEROBIC Blood Culture adequate volume   Culture      NO GROWTH < 24 HOURS Performed at [facility], [address], [place], [place] [address]    Report Status PENDING   I-Stat CG4 Lactic Acid, ED     Status: None   Collection Time: 10/14/17 11:10 PM  Result Value Ref Range   Lactic Acid, Venous 0.99 0.5 - 1.9 mmol/L  CBG monitoring, ED     Status: Abnormal   Collection Time: 10/15/17  8:55 AM  Result Value Ref Range   Glucose-Capillary 301 (H) 65 - 99 mg/dL  CBG monitoring, ED     Status: Abnormal   Collection Time: 10/15/17 12:11 PM  Result Value Ref Range   Glucose-Capillary 382 (H) 65 - 99 mg/dL  Glucose, capillary     Status: Abnormal   Collection Time: 10/15/17  5:04 PM  Result Value Ref Range   Glucose-Capillary 398 (H) 65 - 99 mg/dL  Glucose, capillary     Status: Abnormal   Collection Time: 10/15/17  9:33 PM  Result Value Ref Range   Glucose-Capillary 405 (H) 65 - 99 mg/dL   Comment 1 Notify RN    Comment  2 Document in Chart   Urinalysis, Routine w reflex microscopic     Status: Abnormal   Collection Time: 10/15/17 10:44 PM  Result Value Ref Range   Color, Urine YELLOW YELLOW   APPearance HAZY (A) CLEAR   Specific Gravity, Urine 1.015 1.005 - 1.030   pH 7.0 5.0 - 8.0   Glucose, UA >=500 (A) NEGATIVE mg/dL   Hgb urine dipstick SMALL (A) NEGATIVE   Bilirubin Urine NEGATIVE NEGATIVE   Ketones, ur NEGATIVE NEGATIVE mg/dL   Protein, ur 100 (A) NEGATIVE mg/dL   Nitrite NEGATIVE NEGATIVE   Leukocytes, UA MODERATE (A) NEGATIVE   RBC / HPF 6-10 0 - 5 RBC/hpf   WBC, UA >50 (H) 0 - 5 WBC/hpf   Bacteria, UA RARE (A) NONE SEEN   Squamous Epithelial / LPF 0-5 0 - 5    Comment: Please note change in reference range.   Hyaline Casts, UA PRESENT     Comment: Performed at [facility], [address] [place], [place] [address]  MRSA PCR Screening     Status: None   Collection Time: 10/16/17  1:00 AM  Result Value Ref Range   MRSA by PCR NEGATIVE NEGATIVE    Comment:        The GeneXpert MRSA Assay (FDA approved for NASAL specimens only), is one component  of a comprehensive MRSA colonization surveillance program. It is not intended to diagnose MRSA infection nor to guide or monitor treatment for MRSA infections. Performed at [facility], [address] [place], [place] [address]   CBC     Status: Abnormal   Collection Time: 10/16/17  5:42 AM  Result Value Ref Range   WBC 26.0 (H) 4.0 - 10.5 K/uL    Comment: WHITE COUNT CONFIRMED ON SMEAR   RBC 3.27 (L) 3.87 - 5.11 MIL/uL   Hemoglobin 10.1 (L) 12.0 - 15.0 g/dL   HCT 30.6 (L) 36.0 - 46.0 %   MCV 93.6 78.0 - 100.0 fL   MCH 30.9 26.0 - 34.0 pg   MCHC 33.0 30.0 - 36.0 g/dL   RDW 13.6 11.5 - 15.5 %   Platelets 142 (L) 150 - 400 K/uL    Comment: Performed at [facility], [address], [place], [place] [address]  Comprehensive metabolic panel     Status: Abnormal   Collection Time: 10/16/17  5:42 AM  Result Value Ref Range   Sodium 132  (L) 135 - 145 mmol/L    Comment: DELTA CHECK NOTED   Potassium 5.7 (H) 3.5 - 5.1 mmol/L   Chloride 95 (L) 101 - 111 mmol/L   CO2 21 (L) 22 - 32 mmol/L   Glucose, Bld 357 (H) 65 - 99 mg/dL   BUN 60 (H) 6 - 20 mg/dL   Creatinine, Ser 8.35 (H) 0.44 - 1.00 mg/dL   Calcium 8.3 (L) 8.9 - 10.3 mg/dL   Total Protein 7.6 6.5 - 8.1 g/dL   Albumin 3.4 (L) 3.5 - 5.0 g/dL   AST 29 15 - 41 U/L   ALT 23 14 - 54 U/L   Alkaline Phosphatase 65 38 - 126 U/L   Total Bilirubin 1.0 0.3 - 1.2 mg/dL   GFR calc non Af Amer 4 (L) >60 mL/min   GFR calc Af Amer 5 (L) >60 mL/min    Comment: (NOTE) The eGFR has been calculated using the CKD EPI equation. This calculation has not been validated in all clinical situations. eGFR's persistently <60 mL/min signify possible Chronic Kidney Disease.    Anion gap 16 (H) 5 - 15    Comment: Performed at [facility], [address], [place], [place] [address]  Glucose, capillary     Status: Abnormal   Collection Time: 10/16/17  7:34 AM  Result Value Ref Range   Glucose-Capillary 330 (H) 65 - 99 mg/dL    Dg Chest 2 View  Result Date: 10/14/2017 CLINICAL DATA:  Cough and fever EXAM: CHEST - 2 VIEW COMPARISON:  Chest radiograph 08/26/2017 FINDINGS: Unchanged cardiomegaly. No pulmonary edema or focal airspace consolidation. No pleural effusion or pneumothorax. IMPRESSION: Unchanged cardiomegaly without acute airspace disease. Electronically Signed   By: Ulyses Jarred M.D.   On: 10/14/2017 23:49    Review of Systems  Constitutional: Negative for chills and fever.  HENT: Positive for congestion.   Respiratory: Positive for cough, sputum production, shortness of breath and wheezing.   Cardiovascular: Negative for chest pain, orthopnea and leg swelling.  Gastrointestinal: Negative for nausea and vomiting.   Blood pressure (!) 181/103, pulse 84, temperature 98.7 F (37.1 C), temperature source Oral, resp. rate 18, height '5\' 7"'$  (1.702 m), weight 84.7 kg (186 lb 11.7 oz),  SpO2 100 %. Physical Exam  Constitutional: She is oriented to person, place, and time. No distress.  Eyes: No scleral icterus.  Neck: No JVD present.  Cardiovascular: Normal  rate and regular rhythm.  Respiratory: No respiratory distress. She has wheezes. She has no rales.  GI: She exhibits no distension. There is no tenderness.  Musculoskeletal: She exhibits no edema.  Neurological: She is alert and oriented to person, place, and time.    Assessment/Plan: 1] difficulty breathing: Possibly secondary to bronchitis.  Patient at this moment is feeling much better.  Patient does not have any significant sign of fluid overload. 2] end-stage renal disease: She is status post hemodialysis on Wednesday.  Potassium is normal and she is due for dialysis today. 3] diabetes 4] history of hypertension: Her blood pressure movement is controlled very well 5] anemia: Her hemoglobin is within her target goal 6] bone and mineral disorder: Her calcium is range 7] history of CVA Plan:1] We will make arrangement for patient to get dialysis today for 31/2 hours 2] we will remove about 2 L if systolic blood pressure remains above 90 3] we will check a renal panel and CBC in the morning.  Nathanie Ottley S 10/16/2017, 7:47 AM

## 2017-10-16 NOTE — Progress Notes (Signed)
Patient finished her dialysis session.  Patient tolerated well.  Patient discharged to home.  Patient taken to main floor via wheelchair.

## 2017-10-17 LAB — HEPATITIS B SURFACE ANTIGEN: Hepatitis B Surface Ag: NEGATIVE

## 2017-10-19 DIAGNOSIS — N186 End stage renal disease: Secondary | ICD-10-CM | POA: Diagnosis not present

## 2017-10-19 DIAGNOSIS — Z992 Dependence on renal dialysis: Secondary | ICD-10-CM | POA: Diagnosis not present

## 2017-10-19 LAB — CULTURE, BLOOD (ROUTINE X 2)
CULTURE: NO GROWTH
Culture: NO GROWTH
Special Requests: ADEQUATE
Special Requests: ADEQUATE

## 2017-10-21 DIAGNOSIS — Z992 Dependence on renal dialysis: Secondary | ICD-10-CM | POA: Diagnosis not present

## 2017-10-21 DIAGNOSIS — N186 End stage renal disease: Secondary | ICD-10-CM | POA: Diagnosis not present

## 2017-10-23 DIAGNOSIS — N186 End stage renal disease: Secondary | ICD-10-CM | POA: Diagnosis not present

## 2017-10-23 DIAGNOSIS — Z992 Dependence on renal dialysis: Secondary | ICD-10-CM | POA: Diagnosis not present

## 2017-10-26 DIAGNOSIS — Z992 Dependence on renal dialysis: Secondary | ICD-10-CM | POA: Diagnosis not present

## 2017-10-26 DIAGNOSIS — N186 End stage renal disease: Secondary | ICD-10-CM | POA: Diagnosis not present

## 2017-10-28 DIAGNOSIS — Z992 Dependence on renal dialysis: Secondary | ICD-10-CM | POA: Diagnosis not present

## 2017-10-28 DIAGNOSIS — N186 End stage renal disease: Secondary | ICD-10-CM | POA: Diagnosis not present

## 2017-10-30 DIAGNOSIS — N186 End stage renal disease: Secondary | ICD-10-CM | POA: Diagnosis not present

## 2017-10-30 DIAGNOSIS — Z992 Dependence on renal dialysis: Secondary | ICD-10-CM | POA: Diagnosis not present

## 2017-11-02 DIAGNOSIS — D510 Vitamin B12 deficiency anemia due to intrinsic factor deficiency: Secondary | ICD-10-CM | POA: Diagnosis not present

## 2017-11-02 DIAGNOSIS — N186 End stage renal disease: Secondary | ICD-10-CM | POA: Diagnosis not present

## 2017-11-02 DIAGNOSIS — Z992 Dependence on renal dialysis: Secondary | ICD-10-CM | POA: Diagnosis not present

## 2017-11-04 DIAGNOSIS — N186 End stage renal disease: Secondary | ICD-10-CM | POA: Diagnosis not present

## 2017-11-04 DIAGNOSIS — Z992 Dependence on renal dialysis: Secondary | ICD-10-CM | POA: Diagnosis not present

## 2017-11-06 DIAGNOSIS — Z992 Dependence on renal dialysis: Secondary | ICD-10-CM | POA: Diagnosis not present

## 2017-11-06 DIAGNOSIS — N186 End stage renal disease: Secondary | ICD-10-CM | POA: Diagnosis not present

## 2017-11-09 DIAGNOSIS — Z992 Dependence on renal dialysis: Secondary | ICD-10-CM | POA: Diagnosis not present

## 2017-11-09 DIAGNOSIS — N186 End stage renal disease: Secondary | ICD-10-CM | POA: Diagnosis not present

## 2017-11-11 DIAGNOSIS — N186 End stage renal disease: Secondary | ICD-10-CM | POA: Diagnosis not present

## 2017-11-11 DIAGNOSIS — Z992 Dependence on renal dialysis: Secondary | ICD-10-CM | POA: Diagnosis not present

## 2017-11-13 DIAGNOSIS — N186 End stage renal disease: Secondary | ICD-10-CM | POA: Diagnosis not present

## 2017-11-13 DIAGNOSIS — Z992 Dependence on renal dialysis: Secondary | ICD-10-CM | POA: Diagnosis not present

## 2017-11-16 DIAGNOSIS — E1151 Type 2 diabetes mellitus with diabetic peripheral angiopathy without gangrene: Secondary | ICD-10-CM | POA: Diagnosis not present

## 2017-11-16 DIAGNOSIS — Z992 Dependence on renal dialysis: Secondary | ICD-10-CM | POA: Diagnosis not present

## 2017-11-16 DIAGNOSIS — N186 End stage renal disease: Secondary | ICD-10-CM | POA: Diagnosis not present

## 2017-11-16 DIAGNOSIS — E1140 Type 2 diabetes mellitus with diabetic neuropathy, unspecified: Secondary | ICD-10-CM | POA: Diagnosis not present

## 2017-11-16 DIAGNOSIS — B351 Tinea unguium: Secondary | ICD-10-CM | POA: Diagnosis not present

## 2017-11-16 DIAGNOSIS — L600 Ingrowing nail: Secondary | ICD-10-CM | POA: Diagnosis not present

## 2017-11-18 DIAGNOSIS — Z992 Dependence on renal dialysis: Secondary | ICD-10-CM | POA: Diagnosis not present

## 2017-11-18 DIAGNOSIS — N186 End stage renal disease: Secondary | ICD-10-CM | POA: Diagnosis not present

## 2017-11-20 DIAGNOSIS — Z992 Dependence on renal dialysis: Secondary | ICD-10-CM | POA: Diagnosis not present

## 2017-11-20 DIAGNOSIS — N186 End stage renal disease: Secondary | ICD-10-CM | POA: Diagnosis not present

## 2017-11-23 DIAGNOSIS — Z992 Dependence on renal dialysis: Secondary | ICD-10-CM | POA: Diagnosis not present

## 2017-11-23 DIAGNOSIS — N186 End stage renal disease: Secondary | ICD-10-CM | POA: Diagnosis not present

## 2017-11-25 DIAGNOSIS — Z992 Dependence on renal dialysis: Secondary | ICD-10-CM | POA: Diagnosis not present

## 2017-11-25 DIAGNOSIS — N186 End stage renal disease: Secondary | ICD-10-CM | POA: Diagnosis not present

## 2017-11-27 DIAGNOSIS — N186 End stage renal disease: Secondary | ICD-10-CM | POA: Diagnosis not present

## 2017-11-27 DIAGNOSIS — Z992 Dependence on renal dialysis: Secondary | ICD-10-CM | POA: Diagnosis not present

## 2017-11-30 DIAGNOSIS — N186 End stage renal disease: Secondary | ICD-10-CM | POA: Diagnosis not present

## 2017-11-30 DIAGNOSIS — Z992 Dependence on renal dialysis: Secondary | ICD-10-CM | POA: Diagnosis not present

## 2017-12-01 DIAGNOSIS — J449 Chronic obstructive pulmonary disease, unspecified: Secondary | ICD-10-CM | POA: Diagnosis not present

## 2017-12-01 DIAGNOSIS — N186 End stage renal disease: Secondary | ICD-10-CM | POA: Diagnosis not present

## 2017-12-01 DIAGNOSIS — I10 Essential (primary) hypertension: Secondary | ICD-10-CM | POA: Diagnosis not present

## 2017-12-01 DIAGNOSIS — E1122 Type 2 diabetes mellitus with diabetic chronic kidney disease: Secondary | ICD-10-CM | POA: Diagnosis not present

## 2017-12-02 DIAGNOSIS — N186 End stage renal disease: Secondary | ICD-10-CM | POA: Diagnosis not present

## 2017-12-02 DIAGNOSIS — Z992 Dependence on renal dialysis: Secondary | ICD-10-CM | POA: Diagnosis not present

## 2017-12-04 DIAGNOSIS — N186 End stage renal disease: Secondary | ICD-10-CM | POA: Diagnosis not present

## 2017-12-04 DIAGNOSIS — Z992 Dependence on renal dialysis: Secondary | ICD-10-CM | POA: Diagnosis not present

## 2017-12-07 DIAGNOSIS — N186 End stage renal disease: Secondary | ICD-10-CM | POA: Diagnosis not present

## 2017-12-07 DIAGNOSIS — Z992 Dependence on renal dialysis: Secondary | ICD-10-CM | POA: Diagnosis not present

## 2017-12-09 DIAGNOSIS — N186 End stage renal disease: Secondary | ICD-10-CM | POA: Diagnosis not present

## 2017-12-09 DIAGNOSIS — Z992 Dependence on renal dialysis: Secondary | ICD-10-CM | POA: Diagnosis not present

## 2017-12-11 DIAGNOSIS — Z992 Dependence on renal dialysis: Secondary | ICD-10-CM | POA: Diagnosis not present

## 2017-12-11 DIAGNOSIS — N186 End stage renal disease: Secondary | ICD-10-CM | POA: Diagnosis not present

## 2017-12-13 DIAGNOSIS — N186 End stage renal disease: Secondary | ICD-10-CM | POA: Diagnosis not present

## 2017-12-13 DIAGNOSIS — Z992 Dependence on renal dialysis: Secondary | ICD-10-CM | POA: Diagnosis not present

## 2017-12-14 DIAGNOSIS — N186 End stage renal disease: Secondary | ICD-10-CM | POA: Diagnosis not present

## 2017-12-14 DIAGNOSIS — Z992 Dependence on renal dialysis: Secondary | ICD-10-CM | POA: Diagnosis not present

## 2017-12-16 DIAGNOSIS — N186 End stage renal disease: Secondary | ICD-10-CM | POA: Diagnosis not present

## 2017-12-16 DIAGNOSIS — Z992 Dependence on renal dialysis: Secondary | ICD-10-CM | POA: Diagnosis not present

## 2017-12-18 DIAGNOSIS — Z992 Dependence on renal dialysis: Secondary | ICD-10-CM | POA: Diagnosis not present

## 2017-12-18 DIAGNOSIS — N186 End stage renal disease: Secondary | ICD-10-CM | POA: Diagnosis not present

## 2017-12-21 DIAGNOSIS — N186 End stage renal disease: Secondary | ICD-10-CM | POA: Diagnosis not present

## 2017-12-21 DIAGNOSIS — E119 Type 2 diabetes mellitus without complications: Secondary | ICD-10-CM | POA: Diagnosis not present

## 2017-12-21 DIAGNOSIS — Z992 Dependence on renal dialysis: Secondary | ICD-10-CM | POA: Diagnosis not present

## 2017-12-23 DIAGNOSIS — Z992 Dependence on renal dialysis: Secondary | ICD-10-CM | POA: Diagnosis not present

## 2017-12-23 DIAGNOSIS — N186 End stage renal disease: Secondary | ICD-10-CM | POA: Diagnosis not present

## 2017-12-25 DIAGNOSIS — Z992 Dependence on renal dialysis: Secondary | ICD-10-CM | POA: Diagnosis not present

## 2017-12-25 DIAGNOSIS — N186 End stage renal disease: Secondary | ICD-10-CM | POA: Diagnosis not present

## 2017-12-28 DIAGNOSIS — Z992 Dependence on renal dialysis: Secondary | ICD-10-CM | POA: Diagnosis not present

## 2017-12-28 DIAGNOSIS — N186 End stage renal disease: Secondary | ICD-10-CM | POA: Diagnosis not present

## 2017-12-30 DIAGNOSIS — N186 End stage renal disease: Secondary | ICD-10-CM | POA: Diagnosis not present

## 2017-12-30 DIAGNOSIS — Z992 Dependence on renal dialysis: Secondary | ICD-10-CM | POA: Diagnosis not present

## 2018-01-01 DIAGNOSIS — Z992 Dependence on renal dialysis: Secondary | ICD-10-CM | POA: Diagnosis not present

## 2018-01-01 DIAGNOSIS — N186 End stage renal disease: Secondary | ICD-10-CM | POA: Diagnosis not present

## 2018-01-04 DIAGNOSIS — N186 End stage renal disease: Secondary | ICD-10-CM | POA: Diagnosis not present

## 2018-01-04 DIAGNOSIS — Z992 Dependence on renal dialysis: Secondary | ICD-10-CM | POA: Diagnosis not present

## 2018-01-06 DIAGNOSIS — Z992 Dependence on renal dialysis: Secondary | ICD-10-CM | POA: Diagnosis not present

## 2018-01-06 DIAGNOSIS — N186 End stage renal disease: Secondary | ICD-10-CM | POA: Diagnosis not present

## 2018-01-08 DIAGNOSIS — N186 End stage renal disease: Secondary | ICD-10-CM | POA: Diagnosis not present

## 2018-01-08 DIAGNOSIS — Z992 Dependence on renal dialysis: Secondary | ICD-10-CM | POA: Diagnosis not present

## 2018-01-11 DIAGNOSIS — Z992 Dependence on renal dialysis: Secondary | ICD-10-CM | POA: Diagnosis not present

## 2018-01-11 DIAGNOSIS — N186 End stage renal disease: Secondary | ICD-10-CM | POA: Diagnosis not present

## 2018-01-12 DIAGNOSIS — I10 Essential (primary) hypertension: Secondary | ICD-10-CM | POA: Diagnosis not present

## 2018-01-12 DIAGNOSIS — I69993 Ataxia following unspecified cerebrovascular disease: Secondary | ICD-10-CM | POA: Diagnosis not present

## 2018-01-12 DIAGNOSIS — E1122 Type 2 diabetes mellitus with diabetic chronic kidney disease: Secondary | ICD-10-CM | POA: Diagnosis not present

## 2018-01-13 DIAGNOSIS — N186 End stage renal disease: Secondary | ICD-10-CM | POA: Diagnosis not present

## 2018-01-13 DIAGNOSIS — Z992 Dependence on renal dialysis: Secondary | ICD-10-CM | POA: Diagnosis not present

## 2018-01-15 DIAGNOSIS — N186 End stage renal disease: Secondary | ICD-10-CM | POA: Diagnosis not present

## 2018-01-15 DIAGNOSIS — Z992 Dependence on renal dialysis: Secondary | ICD-10-CM | POA: Diagnosis not present

## 2018-01-18 DIAGNOSIS — N186 End stage renal disease: Secondary | ICD-10-CM | POA: Diagnosis not present

## 2018-01-18 DIAGNOSIS — Z992 Dependence on renal dialysis: Secondary | ICD-10-CM | POA: Diagnosis not present

## 2018-01-20 DIAGNOSIS — Z992 Dependence on renal dialysis: Secondary | ICD-10-CM | POA: Diagnosis not present

## 2018-01-20 DIAGNOSIS — N186 End stage renal disease: Secondary | ICD-10-CM | POA: Diagnosis not present

## 2018-01-22 DIAGNOSIS — N186 End stage renal disease: Secondary | ICD-10-CM | POA: Diagnosis not present

## 2018-01-22 DIAGNOSIS — Z992 Dependence on renal dialysis: Secondary | ICD-10-CM | POA: Diagnosis not present

## 2018-01-25 DIAGNOSIS — Z992 Dependence on renal dialysis: Secondary | ICD-10-CM | POA: Diagnosis not present

## 2018-01-25 DIAGNOSIS — N186 End stage renal disease: Secondary | ICD-10-CM | POA: Diagnosis not present

## 2018-01-27 DIAGNOSIS — Z992 Dependence on renal dialysis: Secondary | ICD-10-CM | POA: Diagnosis not present

## 2018-01-27 DIAGNOSIS — N186 End stage renal disease: Secondary | ICD-10-CM | POA: Diagnosis not present

## 2018-01-29 DIAGNOSIS — Z992 Dependence on renal dialysis: Secondary | ICD-10-CM | POA: Diagnosis not present

## 2018-01-29 DIAGNOSIS — N186 End stage renal disease: Secondary | ICD-10-CM | POA: Diagnosis not present

## 2018-02-01 DIAGNOSIS — B351 Tinea unguium: Secondary | ICD-10-CM | POA: Diagnosis not present

## 2018-02-01 DIAGNOSIS — N186 End stage renal disease: Secondary | ICD-10-CM | POA: Diagnosis not present

## 2018-02-01 DIAGNOSIS — E1151 Type 2 diabetes mellitus with diabetic peripheral angiopathy without gangrene: Secondary | ICD-10-CM | POA: Diagnosis not present

## 2018-02-01 DIAGNOSIS — Z992 Dependence on renal dialysis: Secondary | ICD-10-CM | POA: Diagnosis not present

## 2018-02-01 DIAGNOSIS — L600 Ingrowing nail: Secondary | ICD-10-CM | POA: Diagnosis not present

## 2018-02-01 DIAGNOSIS — E1140 Type 2 diabetes mellitus with diabetic neuropathy, unspecified: Secondary | ICD-10-CM | POA: Diagnosis not present

## 2018-02-03 DIAGNOSIS — Z992 Dependence on renal dialysis: Secondary | ICD-10-CM | POA: Diagnosis not present

## 2018-02-03 DIAGNOSIS — N186 End stage renal disease: Secondary | ICD-10-CM | POA: Diagnosis not present

## 2018-02-05 DIAGNOSIS — Z992 Dependence on renal dialysis: Secondary | ICD-10-CM | POA: Diagnosis not present

## 2018-02-05 DIAGNOSIS — N186 End stage renal disease: Secondary | ICD-10-CM | POA: Diagnosis not present

## 2018-02-08 DIAGNOSIS — Z992 Dependence on renal dialysis: Secondary | ICD-10-CM | POA: Diagnosis not present

## 2018-02-08 DIAGNOSIS — N186 End stage renal disease: Secondary | ICD-10-CM | POA: Diagnosis not present

## 2018-02-10 DIAGNOSIS — D510 Vitamin B12 deficiency anemia due to intrinsic factor deficiency: Secondary | ICD-10-CM | POA: Diagnosis not present

## 2018-02-10 DIAGNOSIS — N186 End stage renal disease: Secondary | ICD-10-CM | POA: Diagnosis not present

## 2018-02-10 DIAGNOSIS — Z992 Dependence on renal dialysis: Secondary | ICD-10-CM | POA: Diagnosis not present

## 2018-02-12 DIAGNOSIS — Z992 Dependence on renal dialysis: Secondary | ICD-10-CM | POA: Diagnosis not present

## 2018-02-12 DIAGNOSIS — N186 End stage renal disease: Secondary | ICD-10-CM | POA: Diagnosis not present

## 2018-02-13 DIAGNOSIS — N186 End stage renal disease: Secondary | ICD-10-CM | POA: Diagnosis not present

## 2018-02-13 DIAGNOSIS — Z992 Dependence on renal dialysis: Secondary | ICD-10-CM | POA: Diagnosis not present

## 2018-02-15 DIAGNOSIS — N186 End stage renal disease: Secondary | ICD-10-CM | POA: Diagnosis not present

## 2018-02-15 DIAGNOSIS — Z992 Dependence on renal dialysis: Secondary | ICD-10-CM | POA: Diagnosis not present

## 2018-02-15 DIAGNOSIS — Z23 Encounter for immunization: Secondary | ICD-10-CM | POA: Diagnosis not present

## 2018-02-17 DIAGNOSIS — Z23 Encounter for immunization: Secondary | ICD-10-CM | POA: Diagnosis not present

## 2018-02-17 DIAGNOSIS — N186 End stage renal disease: Secondary | ICD-10-CM | POA: Diagnosis not present

## 2018-02-17 DIAGNOSIS — Z992 Dependence on renal dialysis: Secondary | ICD-10-CM | POA: Diagnosis not present

## 2018-02-19 ENCOUNTER — Encounter (INDEPENDENT_AMBULATORY_CARE_PROVIDER_SITE_OTHER): Payer: Self-pay | Admitting: *Deleted

## 2018-02-19 ENCOUNTER — Other Ambulatory Visit: Payer: Self-pay

## 2018-02-19 ENCOUNTER — Emergency Department (HOSPITAL_COMMUNITY): Payer: Medicare Other

## 2018-02-19 ENCOUNTER — Emergency Department (HOSPITAL_COMMUNITY)
Admission: EM | Admit: 2018-02-19 | Discharge: 2018-02-19 | Disposition: A | Discharge status: Home / Self Care | Payer: Medicare Other | Attending: Emergency Medicine | Admitting: Emergency Medicine

## 2018-02-19 ENCOUNTER — Encounter (HOSPITAL_COMMUNITY): Payer: Self-pay

## 2018-02-19 DIAGNOSIS — R1319 Other dysphagia: Secondary | ICD-10-CM

## 2018-02-19 DIAGNOSIS — R1310 Dysphagia, unspecified: Secondary | ICD-10-CM

## 2018-02-19 DIAGNOSIS — I120 Hypertensive chronic kidney disease with stage 5 chronic kidney disease or end stage renal disease: Secondary | ICD-10-CM | POA: Insufficient documentation

## 2018-02-19 DIAGNOSIS — Z992 Dependence on renal dialysis: Secondary | ICD-10-CM | POA: Diagnosis not present

## 2018-02-19 DIAGNOSIS — R0789 Other chest pain: Secondary | ICD-10-CM | POA: Insufficient documentation

## 2018-02-19 DIAGNOSIS — N186 End stage renal disease: Secondary | ICD-10-CM | POA: Diagnosis not present

## 2018-02-19 DIAGNOSIS — Z79899 Other long term (current) drug therapy: Secondary | ICD-10-CM | POA: Diagnosis not present

## 2018-02-19 DIAGNOSIS — Z794 Long term (current) use of insulin: Secondary | ICD-10-CM | POA: Diagnosis not present

## 2018-02-19 DIAGNOSIS — Z7982 Long term (current) use of aspirin: Secondary | ICD-10-CM | POA: Diagnosis not present

## 2018-02-19 DIAGNOSIS — E1122 Type 2 diabetes mellitus with diabetic chronic kidney disease: Secondary | ICD-10-CM | POA: Diagnosis not present

## 2018-02-19 DIAGNOSIS — Z23 Encounter for immunization: Secondary | ICD-10-CM | POA: Diagnosis not present

## 2018-02-19 DIAGNOSIS — R079 Chest pain, unspecified: Secondary | ICD-10-CM | POA: Diagnosis not present

## 2018-02-19 LAB — CBC WITH DIFFERENTIAL/PLATELET
BASOS ABS: 0 10*3/uL (ref 0.0–0.1)
Basophils Relative: 0 %
Eosinophils Absolute: 0.1 10*3/uL (ref 0.0–0.7)
Eosinophils Relative: 1 %
HCT: 34.8 % — ABNORMAL LOW (ref 36.0–46.0)
Hemoglobin: 11.2 g/dL — ABNORMAL LOW (ref 12.0–15.0)
LYMPHS PCT: 12 %
Lymphs Abs: 1.3 10*3/uL (ref 0.7–4.0)
MCH: 31.5 pg (ref 26.0–34.0)
MCHC: 32.2 g/dL (ref 30.0–36.0)
MCV: 97.8 fL (ref 78.0–100.0)
MONO ABS: 1 10*3/uL (ref 0.1–1.0)
Monocytes Relative: 9 %
NEUTROS ABS: 9 10*3/uL — AB (ref 1.7–7.7)
Neutrophils Relative %: 78 %
Platelets: 130 10*3/uL — ABNORMAL LOW (ref 150–400)
RBC: 3.56 MIL/uL — AB (ref 3.87–5.11)
RDW: 14 % (ref 11.5–15.5)
WBC: 11.4 10*3/uL — ABNORMAL HIGH (ref 4.0–10.5)

## 2018-02-19 LAB — BASIC METABOLIC PANEL WITH GFR
Anion gap: 10 (ref 5–15)
BUN: 14 mg/dL (ref 8–23)
CO2: 28 mmol/L (ref 22–32)
Calcium: 8.1 mg/dL — ABNORMAL LOW (ref 8.9–10.3)
Chloride: 97 mmol/L — ABNORMAL LOW (ref 98–111)
Creatinine, Ser: 5.13 mg/dL — ABNORMAL HIGH (ref 0.44–1.00)
GFR calc Af Amer: 8 mL/min — ABNORMAL LOW (ref >60)
GFR calc non Af Amer: 7 mL/min — ABNORMAL LOW (ref >60)
Glucose, Bld: 207 mg/dL — ABNORMAL HIGH (ref 70–99)
Potassium: 4.1 mmol/L (ref 3.5–5.1)
Sodium: 135 mmol/L (ref 135–145)

## 2018-02-19 LAB — TROPONIN I: Troponin I: <0.03 ng/mL (ref <0.03)

## 2018-02-19 NOTE — ED Provider Notes (Signed)
[facility] EMERGENCY DEPARTMENT Provider Note   CSN: [id number] Arrival date & time: 02/19/18  1000     History   Chief Complaint Chief Complaint  Patient presents with  . Chest Pain    HPI [name] is a 77 y.o. female.  HPI  Pt was seen at 1020. Per pt and her family, c/o gradual onset and resolution of one episode of chest "burning" that began while she was at HD approximately 0915 this morning. Pt states she took 2 Rolaids and "burped" with complete improvement of her symptoms. Pt states "for a while now" she gets chest "burning" at home, takes "soda and (or) Tums," then "burps" with improvement. Pt also states she occasionally "gets food hung up" when she eats for "a while now." Pt states she "felt fine" immediately after she took the Rolaids. Continues to endorse complete improvement of her symptoms. States the HD staff "sent me here" and "I didn't want to come."  Denies any associated symptoms. Denies palpitations, no SOB/cough, no abd pain, no N/V/D, no back pain, no fevers, no rash, no injury.       Past Medical History:  Diagnosis Date  . Anemia   . Cataract   . Chronic kidney disease   . CVA (cerebral infarction)   . Diabetes mellitus    Type 2  . Diabetes mellitus without complication ([place])   . Dialysis patient ([place])   . Dialysis patient ([place])    M, W, F  . Fistula    R arm  . GERD (gastroesophageal reflux disease)   . Hypertension   . Renal disorder   . Shortness of breath   . Stroke [facility])    right side weakness    Patient Active Problem List   Diagnosis Date Noted  . Acute bronchitis 10/15/2017  . DM type 2, goal A1c below 7   . HCAP (healthcare-associated pneumonia) 05/29/2015  . Hyperkalemia 05/29/2015  . Diabetes mellitus with complication ([place])   . ESRD (end stage renal disease) on dialysis ([place])   . Encounter for adequacy testing for hemodialysis ([place]) 01/17/2014  . Mechanical complication of other vascular device, implant, and graft  01/17/2014  . Disturbances of vision, late effect of stroke 11/03/2013  . Right hemiparesis ([place]) 08/19/2013  . CVA (cerebral infarction) 07/20/2013  . Gastroparesis due to DM ([place]) 07/01/2013  . End stage renal disease ([place]) 06/25/2013  . Anemia 01/26/2013  . DM W/NEURO MNFST, TYPE II, UNCONTROLLED 11/19/2006  . Leukocytosis 11/19/2006  . DEPENDENT EDEMA, LEGS, BILATERAL 11/19/2006  . SINUS TACHYCARDIA 10/16/2006  . Diabetes mellitus, type 2 ([place]) 05/20/2006  . HLD (hyperlipidemia) 05/20/2006  . SYNDROME, RESTLESS LEGS 05/20/2006  . PERIPHERAL NEUROPATHY 05/20/2006  . Hypertension 05/20/2006    Past Surgical History:  Procedure Laterality Date  . AV FISTULA PLACEMENT Right 09/08/2013   Procedure: CREATION OF RIGHT BRACHIAL CEPHALIC ARTERIOVENOUS FISTULA ;  Surgeon: Mal Misty, MD;  Location: [place];  Service: Vascular;  Laterality: Right;  . BASCILIC VEIN TRANSPOSITION Right 01/26/2014   Procedure: Right Arm BASILIC VEIN TRANSPOSITION;  Surgeon: Mal Misty, MD;  Location: [place];  Service: Vascular;  Laterality: Right;  . CATARACT EXTRACTION W/PHACO  11/20/2011   Procedure: CATARACT EXTRACTION PHACO AND INTRAOCULAR LENS PLACEMENT (IOC);  Surgeon: Tonny Branch, MD;  Location: [facility];  Service: Ophthalmology;  Laterality: Right;  CDE 18.82  . CATARACT EXTRACTION W/PHACO Left 11/18/2012   Procedure: CATARACT EXTRACTION PHACO AND INTRAOCULAR LENS PLACEMENT (IOC);  Surgeon:  Tonny Branch, MD;  Location: [facility];  Service: Ophthalmology;  Laterality: Left;  CDE: 18.97  . COLONOSCOPY N/A 02/09/2013   Procedure: COLONOSCOPY;  Surgeon: Rogene Houston, MD;  [facility];  Service: Endoscopy;  Laterality: N/A;  305-moved to 220 Ann to notify pt  . INSERTION OF DIALYSIS CATHETER Right 06/24/2013   Procedure: INSERTION OF DIALYSIS CATHETER: Ultrasound guided;  Surgeon: Serafina Mitchell, MD;  Location: [place];  Service: Vascular;  Laterality: Right;  . LIGATION OF ARTERIOVENOUS  FISTULA Right  01/26/2014   Procedure: LIGATION OF ARTERIOVENOUS  FISTULA;  Surgeon: Mal Misty, MD;  Location: [place];  Service: Vascular;  Laterality: Right;  . 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 LOOP RECORDER IMPLANT N/A 07/21/2013   Procedure: LOOP RECORDER IMPLANT;  Surgeon: Evans Lance, MD;  Location: [facility] CATH LAB;  Service: Cardiovascular;  Laterality: N/A;  . TEE WITHOUT CARDIOVERSION N/A 07/21/2013   Procedure: TRANSESOPHAGEAL ECHOCARDIOGRAM (TEE);  Surgeon: Dorothy Spark, MD;  Location: [facility] ENDOSCOPY;  Service: Cardiovascular;  Laterality: N/A;     OB History    Gravida  0   Para  0   Term  0   Preterm  0   AB  0   Living        SAB  0   TAB  0   Ectopic  0   Multiple      Live Births               Home Medications    Prior to Admission medications   Medication Sig Start Date End Date Taking? Authorizing Provider  albuterol (PROVENTIL HFA;VENTOLIN HFA) 108 (90 Base) MCG/ACT inhaler Inhale 2 puffs into the lungs every 6 (six) hours as needed for wheezing or shortness of breath. 10/16/17   Kathie Dike, MD  aspirin 81 MG chewable tablet Chew 81 mg by mouth daily.    [provider]  atorvastatin (LIPITOR) 20 MG tablet Take 1 tablet by mouth daily. 11/09/14   [provider]  azithromycin (ZITHROMAX) 250 MG tablet Take 1 tablet (250 mg total) by mouth daily. 10/16/17   Kathie Dike, MD  cyanocobalamin (,VITAMIN B-12,) 1000 MCG/ML injection Inject 1 mL into the muscle every 30 (thirty) days. 11/01/14   [provider]  gabapentin (NEURONTIN) 100 MG capsule Take 1 capsule by mouth 2 (two) times daily. 09/01/17   [provider]  guaiFENesin (MUCINEX) 600 MG 12 hr tablet Take 1 tablet (600 mg total) by mouth 2 (two) times daily. 10/16/17   Kathie Dike, MD  ibuprofen (ADVIL,MOTRIN) 200 MG tablet Take 200-400 mg by mouth every 6 (six) hours as needed for moderate pain.     [provider]  insulin NPH Human (HUMULIN N,NOVOLIN N) 100 UNIT/ML injection Inject 16 Units into the skin 2 (two) times daily before a meal.     [provider]  labetalol (NORMODYNE) 300 MG tablet Take 300 mg by mouth 2 (two) times daily. 11/06/14   [provider]  levETIRAcetam (KEPPRA) 250 MG tablet Take 1 tablet by mouth 2 (two) times daily. 10/13/17   [provider]  lidocaine-prilocaine (EMLA) cream APPLY 45 MINUTES PRIOR TO DIALYSIS ON MON,WED,FRI 07/01/15   [provider]  multivitamin (RENA-VIT) TABS tablet Take 1 tablet by mouth at bedtime. 08/21/14   Kirsteins, Luanna Salk, MD  predniSONE (DELTASONE) 10 MG tablet Take 40mg  po  daily for 2 days then 30mg  daily for 2 days then 20mg  daily for 2 days then 10mg  daily for 2 days then stop 10/16/17   Kathie Dike, MD    Family History Family History  Problem Relation Age of Onset  . Cancer Sister   . Cancer Brother   . Anesthesia problems Neg Hx   . Hypotension Neg Hx   . Malignant hyperthermia Neg Hx   . Pseudochol deficiency Neg Hx     Social History Social History   Tobacco Use  . Smoking status: Never Smoker  . Smokeless tobacco: Never Used  Substance Use Topics  . Alcohol use: No  . Drug use: No     Allergies   Ambien [zolpidem]; Reglan [metoclopramide]; and Reglan [metoclopramide]   Review of Systems Review of Systems ROS: Statement: All systems negative except as marked or noted in the HPI; Constitutional: Negative for fever and chills. ; ; Eyes: Negative for eye pain, redness and discharge. ; ; ENMT: Negative for ear pain, hoarseness, nasal congestion, sinus pressure and sore throat. ; ; Cardiovascular: +CP. Negative for palpitations, diaphoresis, dyspnea and peripheral edema. ; ; Respiratory: Negative for cough, wheezing and stridor. ; ; Gastrointestinal: Negative for nausea, vomiting, diarrhea, abdominal pain, blood in stool, hematemesis, jaundice and rectal bleeding. . ; ;  Genitourinary: Negative for dysuria, flank pain and hematuria. ; ; Musculoskeletal: Negative for back pain and neck pain. Negative for swelling and trauma.; ; Skin: Negative for pruritus, rash, abrasions, blisters, bruising and skin lesion.; ; Neuro: Negative for headache, lightheadedness and neck stiffness. Negative for weakness, altered level of consciousness, altered mental status, extremity weakness, paresthesias, involuntary movement, seizure and syncope.       Physical Exam Updated Vital Signs BP (!) 156/58 (BP Location: Left Arm)   Pulse 75   Temp 98.2 F (36.8 C) (Oral)   Resp 18   Ht 5\' 7"  (1.702 m)   Wt 82.6 kg   BMI 28.51 kg/m   Physical Exam 1025: Physical examination:  Nursing notes reviewed; Vital signs and O2 SAT reviewed;  Constitutional: Well developed, Well nourished, Well hydrated, In no acute distress; Head:  Normocephalic, atraumatic; Eyes: EOMI, PERRL, No scleral icterus; ENMT: Mouth and pharynx normal, Mucous membranes moist; Neck: Supple, Full range of motion, No lymphadenopathy; Cardiovascular: Regular rate and rhythm, No gallop; Respiratory: Breath sounds clear & equal bilaterally, No wheezes.  Speaking full sentences with ease, Normal respiratory effort/excursion; Chest: Nontender, Movement normal; Abdomen: Soft, Nontender, Nondistended, Normal bowel sounds; Genitourinary: No CVA tenderness; Extremities: Peripheral pulses normal, No tenderness, No edema, No calf edema or asymmetry.; Neuro: AA&Ox3, Major CN grossly intact.  Speech clear. No gross focal motor or sensory deficits in extremities.; Skin: Color normal, Warm, Dry.   ED Treatments / Results  Labs (all labs ordered are listed, but only abnormal results are displayed)   EKG EKG Interpretation  Date/Time:  Friday February 19 2018 10:28:25 EDT Ventricular Rate:  74 PR Interval:    QRS Duration: 95 QT Interval:  466 QTC Calculation: 518 R Axis:   21 Text Interpretation:  Sinus rhythm Borderline low  voltage, extremity leads Prolonged QT interval Baseline wander When compared with ECG of 03/22/2017 No significant change was found Confirmed by Francine Graven [phone]) on 02/19/2018 10:49:17 AM   Radiology   Procedures Procedures (including critical care time)  Medications Ordered in ED Medications - No data to display   Initial Impression / Assessment and Plan / ED Course  I have reviewed  the triage vital signs and the nursing notes.  Pertinent labs & imaging results that were available during my care of the patient were reviewed by me and considered in my medical decision making (see chart for details).  MDM Reviewed: previous chart, nursing note and vitals Reviewed previous: labs and ECG Interpretation: labs, ECG and x-ray    Results for orders placed or performed during the hospital encounter of 58/85/02  Basic metabolic panel  Result Value Ref Range   Sodium 135 135 - 145 mmol/L   Potassium 4.1 3.5 - 5.1 mmol/L   Chloride 97 (L) 98 - 111 mmol/L   CO2 28 22 - 32 mmol/L   Glucose, Bld 207 (H) 70 - 99 mg/dL   BUN 14 8 - 23 mg/dL   Creatinine, Ser 5.13 (H) 0.44 - 1.00 mg/dL   Calcium 8.1 (L) 8.9 - 10.3 mg/dL   GFR calc non Af Amer 7 (L) >60 mL/min   GFR calc Af Amer 8 (L) >60 mL/min   Anion gap 10 5 - 15  Troponin I  Result Value Ref Range   Troponin I <0.03 <0.03 ng/mL  CBC with Differential  Result Value Ref Range   WBC 11.4 (H) 4.0 - 10.5 K/uL   RBC 3.56 (L) 3.87 - 5.11 MIL/uL   Hemoglobin 11.2 (L) 12.0 - 15.0 g/dL   HCT 34.8 (L) 36.0 - 46.0 %   MCV 97.8 78.0 - 100.0 fL   MCH 31.5 26.0 - 34.0 pg   MCHC 32.2 30.0 - 36.0 g/dL   RDW 14.0 11.5 - 15.5 %   Platelets 130 (L) 150 - 400 K/uL   Neutrophils Relative % 78 %   Neutro Abs 9.0 (H) 1.7 - 7.7 K/uL   Lymphocytes Relative 12 %   Lymphs Abs 1.3 0.7 - 4.0 K/uL   Monocytes Relative 9 %   Monocytes Absolute 1.0 0.1 - 1.0 K/uL   Eosinophils Relative 1 %   Eosinophils Absolute 0.1 0.0 - 0.7 K/uL   Basophils  Relative 0 %   Basophils Absolute 0.0 0.0 - 0.1 K/uL  Troponin I  Result Value Ref Range   Troponin I <0.03 <0.03 ng/mL   Dg Chest 2 View Result Date: 02/19/2018 CLINICAL DATA:  Mid chest pain began this morning.  Nonsmoker. EXAM: CHEST - 2 VIEW COMPARISON:  PA and lateral chest x-ray dated Oct 14, 2017 FINDINGS: The lungs are mildly hyperinflated with mild hemidiaphragm flattening. The interstitial markings are coarse but stable. There is no alveolar infiltrate or pleural effusion. There is no pneumothorax or pneumomediastinum. The heart and pulmonary vascularity are normal. There is mild multilevel degenerative disc disease of the thoracic spine. IMPRESSION: Chronic bronchitic changes, stable. No pneumonia, CHF, nor other acute cardiopulmonary abnormality. Electronically Signed   By: David  Martinique M.D.   On: 02/19/2018 11:12     1610:  Pt continues symptom-free "since taking the Rolaids" this morning. EKG unchanged from previous and troponin negative x2; doubt ACS as cause for symptoms today. Doubt PE as cause for symptoms with low risk [place] and pt without complaints of SOB. No hypoxia while in the ED. Pt will need f/u with GI MD, given her hx of "food getting caught up" in her esophagus when she eats. Pt wants to go home now. Dx and testing d/w pt and family.  Questions answered.  Verb understanding, agreeable to d/c home with outpt f/u.      Final Clinical Impressions(s) / ED Diagnoses   Final diagnoses:  None    ED Discharge Orders    None       Francine Graven, DO 02/23/18 [id number]

## 2018-02-19 NOTE — Discharge Instructions (Addendum)
Take your usual prescriptions as previously directed.  Call your regular medical doctor today to schedule a follow up appointment within the next 3 days. Take small bites and chew your food well until you are seen in follow up. Call the GI doctor today to schedule a follow up appointment for "food sticking in your esophagus when you eat" within the next week.  Return to the Emergency Department immediately sooner if worsening.

## 2018-02-19 NOTE — ED Triage Notes (Signed)
Pt was at dialysis, started having chest pain, and requested 2 rolaids.  Pt says the pain went away after taking the rolaids but dialysis staff sent pt to er for eval.  Reports she received approx 3h of dialysis today.  Reports she usually is on dialysis for 4 hours.  Pt pain free at this time.

## 2018-02-22 DIAGNOSIS — Z23 Encounter for immunization: Secondary | ICD-10-CM | POA: Diagnosis not present

## 2018-02-22 DIAGNOSIS — N186 End stage renal disease: Secondary | ICD-10-CM | POA: Diagnosis not present

## 2018-02-22 DIAGNOSIS — Z992 Dependence on renal dialysis: Secondary | ICD-10-CM | POA: Diagnosis not present

## 2018-02-24 DIAGNOSIS — N186 End stage renal disease: Secondary | ICD-10-CM | POA: Diagnosis not present

## 2018-02-24 DIAGNOSIS — Z992 Dependence on renal dialysis: Secondary | ICD-10-CM | POA: Diagnosis not present

## 2018-02-24 DIAGNOSIS — Z23 Encounter for immunization: Secondary | ICD-10-CM | POA: Diagnosis not present

## 2018-02-26 DIAGNOSIS — Z992 Dependence on renal dialysis: Secondary | ICD-10-CM | POA: Diagnosis not present

## 2018-02-26 DIAGNOSIS — Z23 Encounter for immunization: Secondary | ICD-10-CM | POA: Diagnosis not present

## 2018-02-26 DIAGNOSIS — N186 End stage renal disease: Secondary | ICD-10-CM | POA: Diagnosis not present

## 2018-03-01 DIAGNOSIS — Z23 Encounter for immunization: Secondary | ICD-10-CM | POA: Diagnosis not present

## 2018-03-01 DIAGNOSIS — Z992 Dependence on renal dialysis: Secondary | ICD-10-CM | POA: Diagnosis not present

## 2018-03-01 DIAGNOSIS — N186 End stage renal disease: Secondary | ICD-10-CM | POA: Diagnosis not present

## 2018-03-02 DIAGNOSIS — D510 Vitamin B12 deficiency anemia due to intrinsic factor deficiency: Secondary | ICD-10-CM | POA: Diagnosis not present

## 2018-03-02 DIAGNOSIS — N186 End stage renal disease: Secondary | ICD-10-CM | POA: Diagnosis not present

## 2018-03-02 DIAGNOSIS — E1122 Type 2 diabetes mellitus with diabetic chronic kidney disease: Secondary | ICD-10-CM | POA: Diagnosis not present

## 2018-03-03 DIAGNOSIS — N186 End stage renal disease: Secondary | ICD-10-CM | POA: Diagnosis not present

## 2018-03-03 DIAGNOSIS — Z23 Encounter for immunization: Secondary | ICD-10-CM | POA: Diagnosis not present

## 2018-03-03 DIAGNOSIS — Z992 Dependence on renal dialysis: Secondary | ICD-10-CM | POA: Diagnosis not present

## 2018-03-05 DIAGNOSIS — Z992 Dependence on renal dialysis: Secondary | ICD-10-CM | POA: Diagnosis not present

## 2018-03-05 DIAGNOSIS — Z23 Encounter for immunization: Secondary | ICD-10-CM | POA: Diagnosis not present

## 2018-03-05 DIAGNOSIS — N186 End stage renal disease: Secondary | ICD-10-CM | POA: Diagnosis not present

## 2018-03-08 DIAGNOSIS — Z992 Dependence on renal dialysis: Secondary | ICD-10-CM | POA: Diagnosis not present

## 2018-03-08 DIAGNOSIS — Z23 Encounter for immunization: Secondary | ICD-10-CM | POA: Diagnosis not present

## 2018-03-08 DIAGNOSIS — N186 End stage renal disease: Secondary | ICD-10-CM | POA: Diagnosis not present

## 2018-03-10 DIAGNOSIS — N186 End stage renal disease: Secondary | ICD-10-CM | POA: Diagnosis not present

## 2018-03-10 DIAGNOSIS — Z992 Dependence on renal dialysis: Secondary | ICD-10-CM | POA: Diagnosis not present

## 2018-03-10 DIAGNOSIS — Z23 Encounter for immunization: Secondary | ICD-10-CM | POA: Diagnosis not present

## 2018-03-12 DIAGNOSIS — N186 End stage renal disease: Secondary | ICD-10-CM | POA: Diagnosis not present

## 2018-03-12 DIAGNOSIS — Z992 Dependence on renal dialysis: Secondary | ICD-10-CM | POA: Diagnosis not present

## 2018-03-12 DIAGNOSIS — Z23 Encounter for immunization: Secondary | ICD-10-CM | POA: Diagnosis not present

## 2018-03-15 DIAGNOSIS — Z23 Encounter for immunization: Secondary | ICD-10-CM | POA: Diagnosis not present

## 2018-03-15 DIAGNOSIS — D510 Vitamin B12 deficiency anemia due to intrinsic factor deficiency: Secondary | ICD-10-CM | POA: Diagnosis not present

## 2018-03-15 DIAGNOSIS — Z992 Dependence on renal dialysis: Secondary | ICD-10-CM | POA: Diagnosis not present

## 2018-03-15 DIAGNOSIS — N186 End stage renal disease: Secondary | ICD-10-CM | POA: Diagnosis not present

## 2018-03-17 DIAGNOSIS — N186 End stage renal disease: Secondary | ICD-10-CM | POA: Diagnosis not present

## 2018-03-17 DIAGNOSIS — Z992 Dependence on renal dialysis: Secondary | ICD-10-CM | POA: Diagnosis not present

## 2018-03-19 DIAGNOSIS — N186 End stage renal disease: Secondary | ICD-10-CM | POA: Diagnosis not present

## 2018-03-19 DIAGNOSIS — Z992 Dependence on renal dialysis: Secondary | ICD-10-CM | POA: Diagnosis not present

## 2018-03-22 DIAGNOSIS — N186 End stage renal disease: Secondary | ICD-10-CM | POA: Diagnosis not present

## 2018-03-22 DIAGNOSIS — Z992 Dependence on renal dialysis: Secondary | ICD-10-CM | POA: Diagnosis not present

## 2018-03-24 DIAGNOSIS — N186 End stage renal disease: Secondary | ICD-10-CM | POA: Diagnosis not present

## 2018-03-24 DIAGNOSIS — Z992 Dependence on renal dialysis: Secondary | ICD-10-CM | POA: Diagnosis not present

## 2018-03-26 DIAGNOSIS — N186 End stage renal disease: Secondary | ICD-10-CM | POA: Diagnosis not present

## 2018-03-26 DIAGNOSIS — Z992 Dependence on renal dialysis: Secondary | ICD-10-CM | POA: Diagnosis not present

## 2018-03-29 DIAGNOSIS — Z992 Dependence on renal dialysis: Secondary | ICD-10-CM | POA: Diagnosis not present

## 2018-03-29 DIAGNOSIS — E119 Type 2 diabetes mellitus without complications: Secondary | ICD-10-CM | POA: Diagnosis not present

## 2018-03-29 DIAGNOSIS — N186 End stage renal disease: Secondary | ICD-10-CM | POA: Diagnosis not present

## 2018-03-31 DIAGNOSIS — Z992 Dependence on renal dialysis: Secondary | ICD-10-CM | POA: Diagnosis not present

## 2018-03-31 DIAGNOSIS — N186 End stage renal disease: Secondary | ICD-10-CM | POA: Diagnosis not present

## 2018-04-01 ENCOUNTER — Other Ambulatory Visit (INDEPENDENT_AMBULATORY_CARE_PROVIDER_SITE_OTHER): Payer: Self-pay | Admitting: *Deleted

## 2018-04-01 DIAGNOSIS — Z860100 Personal history of colon polyps, unspecified: Secondary | ICD-10-CM | POA: Insufficient documentation

## 2018-04-02 DIAGNOSIS — Z992 Dependence on renal dialysis: Secondary | ICD-10-CM | POA: Diagnosis not present

## 2018-04-02 DIAGNOSIS — N186 End stage renal disease: Secondary | ICD-10-CM | POA: Diagnosis not present

## 2018-04-05 DIAGNOSIS — Z992 Dependence on renal dialysis: Secondary | ICD-10-CM | POA: Diagnosis not present

## 2018-04-05 DIAGNOSIS — N186 End stage renal disease: Secondary | ICD-10-CM | POA: Diagnosis not present

## 2018-04-07 DIAGNOSIS — Z992 Dependence on renal dialysis: Secondary | ICD-10-CM | POA: Diagnosis not present

## 2018-04-07 DIAGNOSIS — N186 End stage renal disease: Secondary | ICD-10-CM | POA: Diagnosis not present

## 2018-04-09 DIAGNOSIS — Z992 Dependence on renal dialysis: Secondary | ICD-10-CM | POA: Diagnosis not present

## 2018-04-09 DIAGNOSIS — N186 End stage renal disease: Secondary | ICD-10-CM | POA: Diagnosis not present

## 2018-04-12 DIAGNOSIS — N186 End stage renal disease: Secondary | ICD-10-CM | POA: Diagnosis not present

## 2018-04-12 DIAGNOSIS — Z992 Dependence on renal dialysis: Secondary | ICD-10-CM | POA: Diagnosis not present

## 2018-04-14 DIAGNOSIS — N186 End stage renal disease: Secondary | ICD-10-CM | POA: Diagnosis not present

## 2018-04-14 DIAGNOSIS — Z992 Dependence on renal dialysis: Secondary | ICD-10-CM | POA: Diagnosis not present

## 2018-04-15 DIAGNOSIS — N186 End stage renal disease: Secondary | ICD-10-CM | POA: Diagnosis not present

## 2018-04-15 DIAGNOSIS — Z992 Dependence on renal dialysis: Secondary | ICD-10-CM | POA: Diagnosis not present

## 2018-04-15 DIAGNOSIS — D510 Vitamin B12 deficiency anemia due to intrinsic factor deficiency: Secondary | ICD-10-CM | POA: Diagnosis not present

## 2018-04-16 DIAGNOSIS — N186 End stage renal disease: Secondary | ICD-10-CM | POA: Diagnosis not present

## 2018-04-16 DIAGNOSIS — Z992 Dependence on renal dialysis: Secondary | ICD-10-CM | POA: Diagnosis not present

## 2018-04-19 DIAGNOSIS — N186 End stage renal disease: Secondary | ICD-10-CM | POA: Diagnosis not present

## 2018-04-19 DIAGNOSIS — E1151 Type 2 diabetes mellitus with diabetic peripheral angiopathy without gangrene: Secondary | ICD-10-CM | POA: Diagnosis not present

## 2018-04-19 DIAGNOSIS — B351 Tinea unguium: Secondary | ICD-10-CM | POA: Diagnosis not present

## 2018-04-19 DIAGNOSIS — L600 Ingrowing nail: Secondary | ICD-10-CM | POA: Diagnosis not present

## 2018-04-19 DIAGNOSIS — E1140 Type 2 diabetes mellitus with diabetic neuropathy, unspecified: Secondary | ICD-10-CM | POA: Diagnosis not present

## 2018-04-19 DIAGNOSIS — M79671 Pain in right foot: Secondary | ICD-10-CM | POA: Diagnosis not present

## 2018-04-19 DIAGNOSIS — Z992 Dependence on renal dialysis: Secondary | ICD-10-CM | POA: Diagnosis not present

## 2018-04-21 DIAGNOSIS — Z992 Dependence on renal dialysis: Secondary | ICD-10-CM | POA: Diagnosis not present

## 2018-04-21 DIAGNOSIS — N186 End stage renal disease: Secondary | ICD-10-CM | POA: Diagnosis not present

## 2018-04-23 DIAGNOSIS — N186 End stage renal disease: Secondary | ICD-10-CM | POA: Diagnosis not present

## 2018-04-23 DIAGNOSIS — Z992 Dependence on renal dialysis: Secondary | ICD-10-CM | POA: Diagnosis not present

## 2018-04-26 DIAGNOSIS — N186 End stage renal disease: Secondary | ICD-10-CM | POA: Diagnosis not present

## 2018-04-26 DIAGNOSIS — Z992 Dependence on renal dialysis: Secondary | ICD-10-CM | POA: Diagnosis not present

## 2018-04-28 DIAGNOSIS — Z992 Dependence on renal dialysis: Secondary | ICD-10-CM | POA: Diagnosis not present

## 2018-04-28 DIAGNOSIS — N186 End stage renal disease: Secondary | ICD-10-CM | POA: Diagnosis not present

## 2018-04-30 DIAGNOSIS — N186 End stage renal disease: Secondary | ICD-10-CM | POA: Diagnosis not present

## 2018-04-30 DIAGNOSIS — Z992 Dependence on renal dialysis: Secondary | ICD-10-CM | POA: Diagnosis not present

## 2018-05-03 DIAGNOSIS — N186 End stage renal disease: Secondary | ICD-10-CM | POA: Diagnosis not present

## 2018-05-03 DIAGNOSIS — Z992 Dependence on renal dialysis: Secondary | ICD-10-CM | POA: Diagnosis not present

## 2018-05-03 DIAGNOSIS — D510 Vitamin B12 deficiency anemia due to intrinsic factor deficiency: Secondary | ICD-10-CM | POA: Diagnosis not present

## 2018-05-05 DIAGNOSIS — Z992 Dependence on renal dialysis: Secondary | ICD-10-CM | POA: Diagnosis not present

## 2018-05-05 DIAGNOSIS — N186 End stage renal disease: Secondary | ICD-10-CM | POA: Diagnosis not present

## 2018-05-07 DIAGNOSIS — Z992 Dependence on renal dialysis: Secondary | ICD-10-CM | POA: Diagnosis not present

## 2018-05-07 DIAGNOSIS — N186 End stage renal disease: Secondary | ICD-10-CM | POA: Diagnosis not present

## 2018-05-10 DIAGNOSIS — N186 End stage renal disease: Secondary | ICD-10-CM | POA: Diagnosis not present

## 2018-05-10 DIAGNOSIS — Z992 Dependence on renal dialysis: Secondary | ICD-10-CM | POA: Diagnosis not present

## 2018-05-12 DIAGNOSIS — Z992 Dependence on renal dialysis: Secondary | ICD-10-CM | POA: Diagnosis not present

## 2018-05-12 DIAGNOSIS — N186 End stage renal disease: Secondary | ICD-10-CM | POA: Diagnosis not present

## 2018-05-14 DIAGNOSIS — N186 End stage renal disease: Secondary | ICD-10-CM | POA: Diagnosis not present

## 2018-05-14 DIAGNOSIS — Z992 Dependence on renal dialysis: Secondary | ICD-10-CM | POA: Diagnosis not present

## 2018-05-15 DIAGNOSIS — N186 End stage renal disease: Secondary | ICD-10-CM | POA: Diagnosis not present

## 2018-05-15 DIAGNOSIS — Z992 Dependence on renal dialysis: Secondary | ICD-10-CM | POA: Diagnosis not present

## 2018-05-17 DIAGNOSIS — Z992 Dependence on renal dialysis: Secondary | ICD-10-CM | POA: Diagnosis not present

## 2018-05-17 DIAGNOSIS — N186 End stage renal disease: Secondary | ICD-10-CM | POA: Diagnosis not present

## 2018-05-19 DIAGNOSIS — Z992 Dependence on renal dialysis: Secondary | ICD-10-CM | POA: Diagnosis not present

## 2018-05-19 DIAGNOSIS — N186 End stage renal disease: Secondary | ICD-10-CM | POA: Diagnosis not present

## 2018-05-21 ENCOUNTER — Telehealth (INDEPENDENT_AMBULATORY_CARE_PROVIDER_SITE_OTHER): Payer: Self-pay | Admitting: *Deleted

## 2018-05-21 ENCOUNTER — Encounter (INDEPENDENT_AMBULATORY_CARE_PROVIDER_SITE_OTHER): Payer: Self-pay | Admitting: *Deleted

## 2018-05-21 DIAGNOSIS — N186 End stage renal disease: Secondary | ICD-10-CM | POA: Diagnosis not present

## 2018-05-21 DIAGNOSIS — Z992 Dependence on renal dialysis: Secondary | ICD-10-CM | POA: Diagnosis not present

## 2018-05-21 NOTE — Telephone Encounter (Signed)
Patient needs trilyte 

## 2018-05-24 DIAGNOSIS — Z992 Dependence on renal dialysis: Secondary | ICD-10-CM | POA: Diagnosis not present

## 2018-05-24 DIAGNOSIS — N186 End stage renal disease: Secondary | ICD-10-CM | POA: Diagnosis not present

## 2018-05-25 DIAGNOSIS — D510 Vitamin B12 deficiency anemia due to intrinsic factor deficiency: Secondary | ICD-10-CM | POA: Diagnosis not present

## 2018-05-25 DIAGNOSIS — Z0000 Encounter for general adult medical examination without abnormal findings: Secondary | ICD-10-CM | POA: Diagnosis not present

## 2018-05-25 DIAGNOSIS — N186 End stage renal disease: Secondary | ICD-10-CM | POA: Diagnosis not present

## 2018-05-25 DIAGNOSIS — I10 Essential (primary) hypertension: Secondary | ICD-10-CM | POA: Diagnosis not present

## 2018-05-25 DIAGNOSIS — E1122 Type 2 diabetes mellitus with diabetic chronic kidney disease: Secondary | ICD-10-CM | POA: Diagnosis not present

## 2018-05-25 MED ORDER — PEG 3350-KCL-NA BICARB-NACL 420 G PO SOLR: 4000.0000 mL | Freq: Once | ORAL | 0 refills | Status: AC

## 2018-05-26 DIAGNOSIS — Z992 Dependence on renal dialysis: Secondary | ICD-10-CM | POA: Diagnosis not present

## 2018-05-26 DIAGNOSIS — N186 End stage renal disease: Secondary | ICD-10-CM | POA: Diagnosis not present

## 2018-05-27 DIAGNOSIS — E1151 Type 2 diabetes mellitus with diabetic peripheral angiopathy without gangrene: Secondary | ICD-10-CM | POA: Diagnosis not present

## 2018-05-27 DIAGNOSIS — L89892 Pressure ulcer of other site, stage 2: Secondary | ICD-10-CM | POA: Diagnosis not present

## 2018-05-27 DIAGNOSIS — E1140 Type 2 diabetes mellitus with diabetic neuropathy, unspecified: Secondary | ICD-10-CM | POA: Diagnosis not present

## 2018-05-28 DIAGNOSIS — N186 End stage renal disease: Secondary | ICD-10-CM | POA: Diagnosis not present

## 2018-05-28 DIAGNOSIS — Z992 Dependence on renal dialysis: Secondary | ICD-10-CM | POA: Diagnosis not present

## 2018-05-31 DIAGNOSIS — N186 End stage renal disease: Secondary | ICD-10-CM | POA: Diagnosis not present

## 2018-05-31 DIAGNOSIS — Z992 Dependence on renal dialysis: Secondary | ICD-10-CM | POA: Diagnosis not present

## 2018-06-02 DIAGNOSIS — N186 End stage renal disease: Secondary | ICD-10-CM | POA: Diagnosis not present

## 2018-06-02 DIAGNOSIS — Z992 Dependence on renal dialysis: Secondary | ICD-10-CM | POA: Diagnosis not present

## 2018-06-04 DIAGNOSIS — N186 End stage renal disease: Secondary | ICD-10-CM | POA: Diagnosis not present

## 2018-06-04 DIAGNOSIS — Z992 Dependence on renal dialysis: Secondary | ICD-10-CM | POA: Diagnosis not present

## 2018-06-06 DIAGNOSIS — Z992 Dependence on renal dialysis: Secondary | ICD-10-CM | POA: Diagnosis not present

## 2018-06-06 DIAGNOSIS — N186 End stage renal disease: Secondary | ICD-10-CM | POA: Diagnosis not present

## 2018-06-07 ENCOUNTER — Encounter (INDEPENDENT_AMBULATORY_CARE_PROVIDER_SITE_OTHER): Payer: Self-pay | Admitting: *Deleted

## 2018-06-07 ENCOUNTER — Telehealth (INDEPENDENT_AMBULATORY_CARE_PROVIDER_SITE_OTHER): Payer: Self-pay | Admitting: *Deleted

## 2018-06-07 NOTE — Telephone Encounter (Signed)
Referring MD/PCP: hall   Procedure: tcs  Reason/Indication:  Hx polyps  Has patient had this procedure before?  Yes, 2014  If so, when, by whom and where?    Is there a family history of colon cancer?  no  Who?  What age when diagnosed?    Is patient diabetic?   yes      Does patient have prosthetic heart valve or mechanical valve?  no  Do you have a pacemaker?  no  Has patient ever had endocarditis? no  Has patient had joint replacement within last 12 months?  no  Is patient constipated or do they take laxatives? no  Does patient have a history of alcohol/drug use?  no  Is patient on blood thinner such as Coumadin, Plavix and/or Aspirin? yes  Medications: assa 81 mg daily, vit b12 injection, keppra daily, labetalol daily, rena vite daily, humulin 15 units bid  Allergies: reglan  Medication Adjustment per Dr Lindi Adie, NP: decrease humulin to 8 units morning before and 6 units evening before  Procedure date & time: 07/08/18 at 930

## 2018-06-08 DIAGNOSIS — N186 End stage renal disease: Secondary | ICD-10-CM | POA: Diagnosis not present

## 2018-06-08 DIAGNOSIS — Z992 Dependence on renal dialysis: Secondary | ICD-10-CM | POA: Diagnosis not present

## 2018-06-10 NOTE — Telephone Encounter (Signed)
k

## 2018-06-10 NOTE — Telephone Encounter (Signed)
Needs to hold ASA

## 2018-06-10 NOTE — Telephone Encounter (Signed)
Noted on instruction sheet

## 2018-06-11 DIAGNOSIS — N186 End stage renal disease: Secondary | ICD-10-CM | POA: Diagnosis not present

## 2018-06-11 DIAGNOSIS — Z992 Dependence on renal dialysis: Secondary | ICD-10-CM | POA: Diagnosis not present

## 2018-06-14 DIAGNOSIS — N186 End stage renal disease: Secondary | ICD-10-CM | POA: Diagnosis not present

## 2018-06-14 DIAGNOSIS — Z992 Dependence on renal dialysis: Secondary | ICD-10-CM | POA: Diagnosis not present

## 2018-06-15 DIAGNOSIS — N186 End stage renal disease: Secondary | ICD-10-CM | POA: Diagnosis not present

## 2018-06-15 DIAGNOSIS — Z992 Dependence on renal dialysis: Secondary | ICD-10-CM | POA: Diagnosis not present

## 2018-06-16 DIAGNOSIS — N186 End stage renal disease: Secondary | ICD-10-CM | POA: Diagnosis not present

## 2018-06-16 DIAGNOSIS — Z992 Dependence on renal dialysis: Secondary | ICD-10-CM | POA: Diagnosis not present

## 2018-06-18 DIAGNOSIS — Z992 Dependence on renal dialysis: Secondary | ICD-10-CM | POA: Diagnosis not present

## 2018-06-18 DIAGNOSIS — N186 End stage renal disease: Secondary | ICD-10-CM | POA: Diagnosis not present

## 2018-06-21 DIAGNOSIS — Z992 Dependence on renal dialysis: Secondary | ICD-10-CM | POA: Diagnosis not present

## 2018-06-21 DIAGNOSIS — N186 End stage renal disease: Secondary | ICD-10-CM | POA: Diagnosis not present

## 2018-06-23 DIAGNOSIS — N186 End stage renal disease: Secondary | ICD-10-CM | POA: Diagnosis not present

## 2018-06-23 DIAGNOSIS — Z992 Dependence on renal dialysis: Secondary | ICD-10-CM | POA: Diagnosis not present

## 2018-06-25 DIAGNOSIS — Z992 Dependence on renal dialysis: Secondary | ICD-10-CM | POA: Diagnosis not present

## 2018-06-25 DIAGNOSIS — N186 End stage renal disease: Secondary | ICD-10-CM | POA: Diagnosis not present

## 2018-06-28 DIAGNOSIS — N186 End stage renal disease: Secondary | ICD-10-CM | POA: Diagnosis not present

## 2018-06-28 DIAGNOSIS — Z992 Dependence on renal dialysis: Secondary | ICD-10-CM | POA: Diagnosis not present

## 2018-06-28 DIAGNOSIS — E119 Type 2 diabetes mellitus without complications: Secondary | ICD-10-CM | POA: Diagnosis not present

## 2018-06-30 DIAGNOSIS — Z992 Dependence on renal dialysis: Secondary | ICD-10-CM | POA: Diagnosis not present

## 2018-06-30 DIAGNOSIS — N186 End stage renal disease: Secondary | ICD-10-CM | POA: Diagnosis not present

## 2018-07-02 DIAGNOSIS — Z992 Dependence on renal dialysis: Secondary | ICD-10-CM | POA: Diagnosis not present

## 2018-07-02 DIAGNOSIS — N186 End stage renal disease: Secondary | ICD-10-CM | POA: Diagnosis not present

## 2018-07-05 DIAGNOSIS — Z992 Dependence on renal dialysis: Secondary | ICD-10-CM | POA: Diagnosis not present

## 2018-07-05 DIAGNOSIS — N186 End stage renal disease: Secondary | ICD-10-CM | POA: Diagnosis not present

## 2018-07-07 DIAGNOSIS — N186 End stage renal disease: Secondary | ICD-10-CM | POA: Diagnosis not present

## 2018-07-07 DIAGNOSIS — Z992 Dependence on renal dialysis: Secondary | ICD-10-CM | POA: Diagnosis not present

## 2018-07-08 ENCOUNTER — Encounter (HOSPITAL_COMMUNITY)
Admission: RE | Disposition: A | Discharge status: Home / Self Care | Payer: Self-pay | Source: Home / Self Care | Attending: Internal Medicine

## 2018-07-08 ENCOUNTER — Other Ambulatory Visit: Payer: Self-pay

## 2018-07-08 ENCOUNTER — Ambulatory Visit (HOSPITAL_COMMUNITY)
Admission: RE | Admit: 2018-07-08 | Discharge: 2018-07-08 | Disposition: A | Discharge status: Home / Self Care | Payer: Medicare Other | Attending: Internal Medicine | Admitting: Internal Medicine

## 2018-07-08 ENCOUNTER — Encounter (HOSPITAL_COMMUNITY): Payer: Self-pay | Admitting: *Deleted

## 2018-07-08 DIAGNOSIS — Z794 Long term (current) use of insulin: Secondary | ICD-10-CM | POA: Diagnosis not present

## 2018-07-08 DIAGNOSIS — K219 Gastro-esophageal reflux disease without esophagitis: Secondary | ICD-10-CM | POA: Diagnosis not present

## 2018-07-08 DIAGNOSIS — Z888 Allergy status to other drugs, medicaments and biological substances status: Secondary | ICD-10-CM | POA: Insufficient documentation

## 2018-07-08 DIAGNOSIS — Z7982 Long term (current) use of aspirin: Secondary | ICD-10-CM | POA: Insufficient documentation

## 2018-07-08 DIAGNOSIS — Z992 Dependence on renal dialysis: Secondary | ICD-10-CM | POA: Diagnosis not present

## 2018-07-08 DIAGNOSIS — Z79899 Other long term (current) drug therapy: Secondary | ICD-10-CM | POA: Diagnosis not present

## 2018-07-08 DIAGNOSIS — D124 Benign neoplasm of descending colon: Secondary | ICD-10-CM | POA: Insufficient documentation

## 2018-07-08 DIAGNOSIS — I129 Hypertensive chronic kidney disease with stage 1 through stage 4 chronic kidney disease, or unspecified chronic kidney disease: Secondary | ICD-10-CM | POA: Insufficient documentation

## 2018-07-08 DIAGNOSIS — K644 Residual hemorrhoidal skin tags: Secondary | ICD-10-CM | POA: Diagnosis not present

## 2018-07-08 DIAGNOSIS — Z8673 Personal history of transient ischemic attack (TIA), and cerebral infarction without residual deficits: Secondary | ICD-10-CM | POA: Insufficient documentation

## 2018-07-08 DIAGNOSIS — Z86010 Personal history of colonic polyps: Secondary | ICD-10-CM

## 2018-07-08 DIAGNOSIS — K5730 Diverticulosis of large intestine without perforation or abscess without bleeding: Secondary | ICD-10-CM

## 2018-07-08 DIAGNOSIS — E1122 Type 2 diabetes mellitus with diabetic chronic kidney disease: Secondary | ICD-10-CM | POA: Insufficient documentation

## 2018-07-08 DIAGNOSIS — N189 Chronic kidney disease, unspecified: Secondary | ICD-10-CM | POA: Insufficient documentation

## 2018-07-08 DIAGNOSIS — Z1211 Encounter for screening for malignant neoplasm of colon: Secondary | ICD-10-CM | POA: Insufficient documentation

## 2018-07-08 DIAGNOSIS — Z09 Encounter for follow-up examination after completed treatment for conditions other than malignant neoplasm: Secondary | ICD-10-CM

## 2018-07-08 HISTORY — PX: POLYPECTOMY: SHX5525

## 2018-07-08 HISTORY — PX: COLONOSCOPY: SHX5424

## 2018-07-08 LAB — GLUCOSE, CAPILLARY: GLUCOSE-CAPILLARY: 117 mg/dL — AB (ref 70–99)

## 2018-07-08 SURGERY — COLONOSCOPY
Anesthesia: Moderate Sedation

## 2018-07-08 MED ORDER — MEPERIDINE HCL 50 MG/ML IJ SOLN
INTRAMUSCULAR | Status: AC
  Filled 2018-07-08: qty 1

## 2018-07-08 MED ORDER — STERILE WATER FOR IRRIGATION IR SOLN
Status: DC | PRN
  Administered 2018-07-08: 2.5 mL

## 2018-07-08 MED ORDER — MIDAZOLAM HCL 5 MG/5ML IJ SOLN
INTRAMUSCULAR | Status: DC | PRN
  Administered 2018-07-08: 2 mg via INTRAVENOUS

## 2018-07-08 MED ORDER — FENTANYL CITRATE (PF) 100 MCG/2ML IJ SOLN
INTRAMUSCULAR | Status: DC | PRN
  Administered 2018-07-08 (×2): 25 ug via INTRAVENOUS

## 2018-07-08 MED ORDER — SODIUM CHLORIDE 0.9 % IV SOLN
INTRAVENOUS | Status: DC
  Administered 2018-07-08: 1000 mL via INTRAVENOUS

## 2018-07-08 MED ORDER — FENTANYL CITRATE (PF) 100 MCG/2ML IJ SOLN
INTRAMUSCULAR | Status: AC
  Filled 2018-07-08: qty 2

## 2018-07-08 MED ORDER — MIDAZOLAM HCL 5 MG/5ML IJ SOLN
INTRAMUSCULAR | Status: AC
  Filled 2018-07-08: qty 10

## 2018-07-08 NOTE — H&P (Signed)
[name] is an 78 y.o. female.   Chief Complaint: Patient is here for colonoscopy. HPI: Patient is 78 year old Afro-American female who has history of colonic adenoma and is here for surveillance colonoscopy.  Last exam was in August 2014 with removal of 10 mm tubular adenoma from splenic flexure.  She denies abdominal pain change in bowel habits are rectal bleeding.   She is on dialysis Monday Wednesday and Friday. Family history is negative for CRC. Last aspirin dose was 2 days ago.  Past Medical History:  Diagnosis Date  . Anemia   . Cataract   . Chronic kidney disease   . CVA (cerebral infarction)   . Diabetes mellitus    Type 2  . Diabetes mellitus without complication ([place])   . Dialysis patient ([place])        M, W, F  . Fistula    R arm  . GERD (gastroesophageal reflux disease)   . Hypertension   . Renal disorder   . Shortness of breath   . Stroke [facility])    right side weakness    Past Surgical History:  Procedure Laterality Date  . AV FISTULA PLACEMENT Right 09/08/2013   Procedure: CREATION OF RIGHT BRACHIAL CEPHALIC ARTERIOVENOUS FISTULA ;  Surgeon: Mal Misty, MD;  Location: [place];  Service: Vascular;  Laterality: Right;  . BASCILIC VEIN TRANSPOSITION Right 01/26/2014   Procedure: Right Arm BASILIC VEIN TRANSPOSITION;  Surgeon: Mal Misty, MD;  Location: [place];  Service: Vascular;  Laterality: Right;  . CATARACT EXTRACTION W/PHACO  11/20/2011   Procedure: CATARACT EXTRACTION PHACO AND INTRAOCULAR LENS PLACEMENT (IOC);  Surgeon: Tonny Branch, MD;  Location: [facility];  Service: Ophthalmology;  Laterality: Right;  CDE 18.82  . CATARACT EXTRACTION W/PHACO Left 11/18/2012   Procedure: CATARACT EXTRACTION PHACO AND INTRAOCULAR LENS PLACEMENT (IOC);  Surgeon: Tonny Branch, MD;  Location: [facility];  Service: Ophthalmology;  Laterality: Left;  CDE: 18.97  . COLONOSCOPY N/A 02/09/2013   Procedure: COLONOSCOPY;  Surgeon: Rogene Houston, MD;  [facility];  Service:  Endoscopy;  Laterality: N/A;  305-moved to 220 Ann to notify pt  . INSERTION OF DIALYSIS CATHETER Right 06/24/2013   Procedure: INSERTION OF DIALYSIS CATHETER: Ultrasound guided;  Surgeon: Serafina Mitchell, MD;  Location: [place];  Service: Vascular;  Laterality: Right;  . LIGATION OF ARTERIOVENOUS  FISTULA Right 01/26/2014   Procedure: LIGATION OF ARTERIOVENOUS  FISTULA;  Surgeon: Mal Misty, MD;  Location: [place];  Service: Vascular;  Laterality: Right;  . 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 LOOP RECORDER IMPLANT N/A 07/21/2013   Procedure: LOOP RECORDER IMPLANT;  Surgeon: Evans Lance, MD;  Location: [facility] CATH LAB;  Service: Cardiovascular;  Laterality: N/A;  . TEE WITHOUT CARDIOVERSION N/A 07/21/2013   Procedure: TRANSESOPHAGEAL ECHOCARDIOGRAM (TEE);  Surgeon: Dorothy Spark, MD;  Location: [facility] ENDOSCOPY;  Service: Cardiovascular;  Laterality: N/A;    Family History  Problem Relation Age of Onset  . Cancer Sister   . Cancer Brother   . Anesthesia problems Neg Hx   . Hypotension Neg Hx   . Malignant hyperthermia Neg Hx   . Pseudochol deficiency Neg Hx    Social History:  reports that she has never smoked. She has never used smokeless tobacco. She reports that she does not drink alcohol or use drugs.  Allergies:  Allergies  Allergen Reactions  . Ambien [Zolpidem] Other (See  Comments)    Hallucinations   . Reglan [Metoclopramide] Other (See Comments)    Hallucinations   . Reglan [Metoclopramide] Other (See Comments)    "makes me crazy"    Medications Prior to Admission  Medication Sig Dispense Refill  . albuterol (PROVENTIL HFA;VENTOLIN HFA) 108 (90 Base) MCG/ACT inhaler Inhale 2 puffs into the lungs every 6 (six) hours as needed for wheezing or shortness of breath. 1 Inhaler 2  . cyanocobalamin (,VITAMIN B-12,) 1000 MCG/ML injection Inject 1 mL into the muscle every 30 (thirty) days.  11  . gabapentin (NEURONTIN) 100 MG capsule  Take 100 mg by mouth at bedtime.     . insulin NPH Human (HUMULIN N,NOVOLIN N) 100 UNIT/ML injection Inject 14 Units into the skin 2 (two) times daily before a meal.     . labetalol (NORMODYNE) 300 MG tablet Take 300 mg by mouth 2 (two) times daily.  3  . levETIRAcetam (KEPPRA) 250 MG tablet Take 250 mg by mouth 2 (two) times daily.     Marland Kitchen lidocaine-prilocaine (EMLA) cream Apply 1 application topically every Monday, Wednesday, and Friday.     . multivitamin (RENA-VIT) TABS tablet Take 1 tablet by mouth at bedtime. 30 tablet 0  . sevelamer carbonate (RENVELA) 800 MG tablet Take 800 mg by mouth 3 (three) times daily with meals.   11  . aspirin 81 MG chewable tablet Chew 162 mg by mouth daily.     Marland Kitchen ibuprofen (ADVIL,MOTRIN) 200 MG tablet Take 400 mg by mouth every 6 (six) hours as needed for headache or moderate pain.       Results for orders placed or performed during the hospital encounter of 07/08/18 (from the past 48 hour(s))  Glucose, capillary     Status: Abnormal   Collection Time: 07/08/18  9:09 AM  Result Value Ref Range   Glucose-Capillary 117 (H) 70 - 99 mg/dL   No results found.  ROS  Blood pressure (!) 156/67, pulse 74, temperature 98.2 F (36.8 C), temperature source Oral, resp. rate 16, height 5\' 7"  (1.702 m), weight 85.3 kg, SpO2 98 %. Physical Exam  Constitutional: She appears well-developed and well-nourished.  HENT:  Mouth/Throat: Oropharynx is clear and moist.  Eyes: Conjunctivae are normal. No scleral icterus.  Neck: No thyromegaly present.  Cardiovascular: Normal rate, regular rhythm and normal heart sounds.  No murmur heard. Respiratory: Effort normal and breath sounds normal.  GI:  Abdomen is full but soft and nontender with organomegaly or masses.  Musculoskeletal:        General: No edema.     Comments: She has AV graft at right forearm.  Lymphadenopathy:    She has no cervical adenopathy.  Neurological: She is alert.  Skin: Skin is warm and dry.      Assessment/Plan History of tubular adenoma. Surveillance colonoscopy.  Hildred Laser, MD 07/08/2018, 9:30 AM

## 2018-07-08 NOTE — Discharge Instructions (Signed)
No aspirin or NSAIDs for 24 hours. Resume other medications as before. Resume modified carb high-fiber diet. No driving for 24 hours. Physician will call with biopsy results.   Colonoscopy, Adult, Care After This sheet gives you information about how to care for yourself after your procedure. Your doctor may also give you more specific instructions. If you have problems or questions, call your doctor. What can I expect after the procedure? After the procedure, it is common to have:  A small amount of blood in your poop for 24 hours.  Some gas.  Mild cramping or bloating in your belly. Follow these instructions at home: General instructions  For the first 24 hours after the procedure: ? Do not drive or use machinery. ? Do not sign important documents. ? Do not drink alcohol. ? Do your daily activities more slowly than normal. ? Eat foods that are soft and easy to digest.  Take over-the-counter or prescription medicines only as told by your doctor. To help cramping and bloating:   Try walking around.  Put heat on your belly (abdomen) as told by your doctor. Use a heat source that your doctor recommends, such as a moist heat pack or a heating pad. ? Put a towel between your skin and the heat source. ? Leave the heat on for 20-30 minutes. ? Remove the heat if your skin turns bright red. This is especially important if you cannot feel pain, heat, or cold. You can get burned. Eating and drinking   Drink enough fluid to keep your pee (urine) clear or pale yellow.  Return to your normal diet as told by your doctor. Avoid heavy or fried foods that are hard to digest.  Avoid drinking alcohol for as long as told by your doctor. Contact a doctor if:  You have blood in your poop (stool) 2-3 days after the procedure. Get help right away if:  You have more than a small amount of blood in your poop.  You see large clumps of tissue (blood clots) in your poop.  Your belly is  swollen.  You feel sick to your stomach (nauseous).  You throw up (vomit).  You have a fever.  You have belly pain that gets worse, and medicine does not help your pain. Summary  After the procedure, it is common to have a small amount of blood in your poop. You may also have mild cramping and bloating in your belly.  For the first 24 hours after the procedure, do not drive or use machinery, do not sign important documents, and do not drink alcohol.  Get help right away if you have a lot of blood in your poop, feel sick to your stomach, have a fever, or have more belly pain. This information is not intended to replace advice given to you by your health care provider. Make sure you discuss any questions you have with your health care provider. Document Released: 07/05/2010 Document Revised: 04/02/2017 Document Reviewed: 02/25/2016 Elsevier Interactive Patient Education  2019 [employer].   Colon Polyps  Polyps are tissue growths inside the body. Polyps can grow in many places, including the large intestine (colon). A polyp may be a round bump or a mushroom-shaped growth. You could have one polyp or several. Most colon polyps are noncancerous (benign). However, some colon polyps can become cancerous over time. Finding and removing the polyps early can help prevent this. What are the causes? The exact cause of colon polyps is not known. What increases the  risk? You are more likely to develop this condition if you:  Have a family history of colon cancer or colon polyps.  Are older than 74 or older than 45 if you are African American.  Have inflammatory bowel disease, such as ulcerative colitis or Crohn's disease.  Have certain hereditary conditions, such as: ? Familial adenomatous polyposis. ? Lynch syndrome. ? Turcot syndrome. ? Peutz-Jeghers syndrome.  Are overweight.  Smoke cigarettes.  Do not get enough exercise.  Drink too much alcohol.  Eat a diet that is high in  fat and red meat and low in fiber.  Had childhood cancer that was treated with abdominal radiation. What are the signs or symptoms? Most polyps do not cause symptoms. If you have symptoms, they may include:  Blood coming from your rectum when having a bowel movement.  Blood in your stool. The stool may look dark red or black.  Abdominal pain.  A change in bowel habits, such as constipation or diarrhea. How is this diagnosed? This condition is diagnosed with a colonoscopy. This is a procedure in which a lighted, flexible scope is inserted into the anus and then passed into the colon to examine the area. Polyps are sometimes found when a colonoscopy is done as part of routine cancer screening tests. How is this treated? Treatment for this condition involves removing any polyps that are found. Most polyps can be removed during a colonoscopy. Those polyps will then be tested for cancer. Additional treatment may be needed depending on the results of testing. Follow these instructions at home: Lifestyle  Maintain a healthy weight, or lose weight if recommended by your health care provider.  Exercise every day or as told by your health care provider.  Do not use any products that contain nicotine or tobacco, such as cigarettes and e-cigarettes. If you need help quitting, ask your health care provider.  If you drink alcohol, limit how much you have: ? 0-1 drink a day for women. ? 0-2 drinks a day for men.  Be aware of how much alcohol is in your drink. In the [place] one drink equals one 12 oz bottle of beer (355 mL), one 5 oz glass of wine (148 mL), or one 1 oz shot of hard liquor (44 mL). Eating and drinking   Eat foods that are high in fiber, such as fruits, vegetables, and whole grains.  Eat foods that are high in calcium and vitamin D, such as milk, cheese, yogurt, eggs, liver, fish, and broccoli.  Limit foods that are high in fat, such as fried foods and desserts.  Limit the  amount of red meat and processed meat you eat, such as hot dogs, sausage, bacon, and lunch meats. General instructions  Keep all follow-up visits as told by your health care provider. This is important. ? This includes having regularly scheduled colonoscopies. ? Talk to your health care provider about when you need a colonoscopy. Contact a health care provider if:  You have new or worsening bleeding during a bowel movement.  You have new or increased blood in your stool.  You have a change in bowel habits.  You lose weight for no known reason. Summary  Polyps are tissue growths inside the body. Polyps can grow in many places, including the colon.  Most colon polyps are noncancerous (benign), but some can become cancerous over time.  This condition is diagnosed with a colonoscopy.  Treatment for this condition involves removing any polyps that are found. Most polyps can  be removed during a colonoscopy. This information is not intended to replace advice given to you by your health care provider. Make sure you discuss any questions you have with your health care provider. Document Released: 02/27/2004 Document Revised: 09/17/2017 Document Reviewed: 09/17/2017 Elsevier Interactive Patient Education  2019 [employer].   Diverticulosis  Diverticulosis is a condition that develops when small pouches (diverticula) form in the wall of the large intestine (colon). The colon is where water is absorbed and stool is formed. The pouches form when the inside layer of the colon pushes through weak spots in the outer layers of the colon. You may have a few pouches or many of them. What are the causes? The cause of this condition is not known. What increases the risk? The following factors may make you more likely to develop this condition:  Being older than age 7. Your risk for this condition increases with age. Diverticulosis is rare among people younger than age 3. By age 66, many people  have it.  Eating a low-fiber diet.  Having frequent constipation.  Being overweight.  Not getting enough exercise.  Smoking.  Taking over-the-counter pain medicines, like aspirin and ibuprofen.  Having a family history of diverticulosis. What are the signs or symptoms? In most people, there are no symptoms of this condition. If you do have symptoms, they may include:  Bloating.  Cramps in the abdomen.  Constipation or diarrhea.  Pain in the lower left side of the abdomen. How is this diagnosed? This condition is most often diagnosed during an exam for other colon problems. Because diverticulosis usually has no symptoms, it often cannot be diagnosed independently. This condition may be diagnosed by:  Using a flexible scope to examine the colon (colonoscopy).  Taking an X-ray of the colon after dye has been put into the colon (barium enema).  Doing a CT scan. How is this treated? You may not need treatment for this condition if you have never developed an infection related to diverticulosis. If you have had an infection before, treatment may include:  Eating a high-fiber diet. This may include eating more fruits, vegetables, and grains.  Taking a fiber supplement.  Taking a live bacteria supplement (probiotic).  Taking medicine to relax your colon.  Taking antibiotic medicines. Follow these instructions at home:  Drink 6-8 glasses of water or more each day to prevent constipation.  Try not to strain when you have a bowel movement.  If you have had an infection before: ? Eat more fiber as directed by your health care provider or your diet and nutrition specialist (dietitian). ? Take a fiber supplement or probiotic, if your health care provider approves.  Take over-the-counter and prescription medicines only as told by your health care provider.  If you were prescribed an antibiotic, take it as told by your health care provider. Do not stop taking the antibiotic  even if you start to feel better.  Keep all follow-up visits as told by your health care provider. This is important. Contact a health care provider if:  You have pain in your abdomen.  You have bloating.  You have cramps.  You have not had a bowel movement in 3 days. Get help right away if:  Your pain gets worse.  Your bloating becomes very bad.  You have a fever or chills, and your symptoms suddenly get worse.  You vomit.  You have bowel movements that are bloody or black.  You have bleeding from your rectum. Summary  Diverticulosis is a condition that develops when small pouches (diverticula) form in the wall of the large intestine (colon).  You may have a few pouches or many of them.  This condition is most often diagnosed during an exam for other colon problems.  If you have had an infection related to diverticulosis, treatment may include increasing the fiber in your diet, taking supplements, or taking medicines. This information is not intended to replace advice given to you by your health care provider. Make sure you discuss any questions you have with your health care provider. Document Released: 02/28/2004 Document Revised: 04/21/2016 Document Reviewed: 04/21/2016 Elsevier Interactive Patient Education  2019 [employer]  PATIENT INSTRUCTIONS POST-ANESTHESIA  IMMEDIATELY FOLLOWING SURGERY:  Do not drive or operate machinery for the first twenty four hours after surgery.  Do not make any important decisions for twenty four hours after surgery or while taking narcotic pain medications or sedatives.  If you develop intractable nausea and vomiting or a severe headache please notify your doctor immediately.  FOLLOW-UP:  Please make an appointment with your surgeon as instructed. You do not need to follow up with anesthesia unless specifically instructed to do so.  WOUND CARE INSTRUCTIONS (if applicable):  Keep a dry clean dressing on the anesthesia/puncture wound  site if there is drainage.  Once the wound has quit draining you may leave it open to air.  Generally you should leave the bandage intact for twenty four hours unless there is drainage.  If the epidural site drains for more than 36-48 hours please call the anesthesia department.  QUESTIONS?:  Please feel free to call your physician or the hospital operator if you have any questions, and they will be happy to assist you.

## 2018-07-08 NOTE — Op Note (Signed)
[facility] Patient Name: [name] Procedure Date: 07/08/2018 9:17 AM MRN: [id number] Date of Birth: 13-Jan-1941 Attending MD: Hildred Laser , MD CSN: [id number] Age: 78 Admit Type: Outpatient Procedure:                Colonoscopy Indications:              High risk colon cancer surveillance: Personal                            history of colonic polyps Providers:                Hildred Laser, MD, Gwynneth Albright RN, RN,                            Gerome Sam, RN, Randa Spike, Technician Referring MD:             Barrie Folk. Hill MD, MD Medicines:                Fentanyl 50 micrograms IV, Midazolam 2 mg IV Complications:            No immediate complications. Estimated Blood Loss:     Estimated blood loss was minimal. Procedure:                Pre-Anesthesia Assessment:                           - Prior to the procedure, a History and Physical                            was performed, and patient medications and                            allergies were reviewed. The patient's tolerance of                            previous anesthesia was also reviewed. The risks                            and benefits of the procedure and the sedation                            options and risks were discussed with the patient.                            All questions were answered, and informed consent                            was obtained. Prior Anticoagulants: The patient                            last took aspirin 2 days prior to the procedure.                            ASA Grade Assessment: III - A patient with severe  systemic disease. After reviewing the risks and                            benefits, the patient was deemed in satisfactory                            condition to undergo the procedure.                           After obtaining informed consent, the colonoscope                            was passed under direct vision.  Throughout the                            procedure, the patient's blood pressure, pulse, and                            oxygen saturations were monitored continuously. The                            [id number]-[id number] ([id number]) scope was introduced through                            the anus and advanced to the the cecum, identified                            by appendiceal orifice and ileocecal valve. The                            colonoscopy was somewhat difficult due to                            significant looping. The patient tolerated the                            procedure well. The quality of the bowel                            preparation was adequate. The ileocecal valve,                            appendiceal orifice, and rectum were photographed. Scope In: 9:40:27 AM Scope Out: 10:04:07 AM Scope Withdrawal Time: 0 hours 13 minutes 58 seconds  Total Procedure Duration: 0 hours 23 minutes 40 seconds  Findings:      The perianal and digital rectal examinations were normal.      Multiple small and large-mouthed diverticula were found in the entire       colon.      Two sessile polyps were found in the proximal descending colon. The       polyps were 5 to 6 mm in size. These polyps were removed with a cold       snare. Resection and retrieval were complete. The pathology specimen was       placed  into Bottle Number 1.      The retroflexed view of the distal rectum and anal verge was normal and       showed no anal or rectal abnormalities.      External hemorrhoids were found during retroflexion. The hemorrhoids       were small. Impression:               - Diverticulosis in the entire examined colon.                           - Two 5 to 6 mm polyps in the proximal descending                            colon, removed with a cold snare. Resected and                            retrieved.                           - External hemorrhoids. Moderate Sedation:      Moderate (conscious)  sedation was administered by the endoscopy nurse       and supervised by the endoscopist. The following parameters were       monitored: oxygen saturation, heart rate, blood pressure, CO2       capnography and response to care. Total physician intraservice time was       29 minutes. Recommendation:           - Patient has a contact number available for                            emergencies. The signs and symptoms of potential                            delayed complications were discussed with the                            patient. Return to normal activities tomorrow.                            Written discharge instructions were provided to the                            patient.                           - High fiber diet and diabetic (ADA) diet today.                           - Continue present medications.                           - No aspirin, ibuprofen, naproxen, or other                            non-steroidal anti-inflammatory drugs for 1 day.                           -  Await pathology results.                           - Repeat colonoscopy is recommended. The                            colonoscopy date will be determined after pathology                            results from today's exam become available for                            review. Procedure Code(s):        --- Professional ---                           [id number], Colonoscopy, flexible; with removal of                            tumor(s), polyp(s), or other lesion(s) by snare                            technique                           99153, Moderate sedation; each additional 15                            minutes intraservice time                           G0500, Moderate sedation services provided by the                            same physician or other qualified health care                            professional performing a gastrointestinal                            endoscopic service that sedation  supports,                            requiring the presence of an independent trained                            observer to assist in the monitoring of the                            patient's level of consciousness and physiological                            status; initial 15 minutes of intra-service time;                            patient age 28 years or older (additional  time may                            be reported with [phone], as appropriate) Diagnosis Code(s):        --- Professional ---                           Z86.010, Personal history of colonic polyps                           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 All rights reserved. The codes documented in this report are preliminary and upon coder review may  be revised to meet current compliance requirements. Hildred Laser, MD Hildred Laser, MD 07/08/2018 10:13:03 AM This report has been signed electronically. Number of Addenda: 0

## 2018-07-09 DIAGNOSIS — Z992 Dependence on renal dialysis: Secondary | ICD-10-CM | POA: Diagnosis not present

## 2018-07-09 DIAGNOSIS — N186 End stage renal disease: Secondary | ICD-10-CM | POA: Diagnosis not present

## 2018-07-12 ENCOUNTER — Encounter (HOSPITAL_COMMUNITY): Payer: Self-pay | Admitting: Internal Medicine

## 2018-07-12 DIAGNOSIS — Z992 Dependence on renal dialysis: Secondary | ICD-10-CM | POA: Diagnosis not present

## 2018-07-12 DIAGNOSIS — N186 End stage renal disease: Secondary | ICD-10-CM | POA: Diagnosis not present

## 2018-07-14 DIAGNOSIS — N186 End stage renal disease: Secondary | ICD-10-CM | POA: Diagnosis not present

## 2018-07-14 DIAGNOSIS — Z992 Dependence on renal dialysis: Secondary | ICD-10-CM | POA: Diagnosis not present

## 2018-07-16 DIAGNOSIS — Z992 Dependence on renal dialysis: Secondary | ICD-10-CM | POA: Diagnosis not present

## 2018-07-16 DIAGNOSIS — N186 End stage renal disease: Secondary | ICD-10-CM | POA: Diagnosis not present

## 2018-07-19 DIAGNOSIS — N186 End stage renal disease: Secondary | ICD-10-CM | POA: Diagnosis not present

## 2018-07-19 DIAGNOSIS — E1151 Type 2 diabetes mellitus with diabetic peripheral angiopathy without gangrene: Secondary | ICD-10-CM | POA: Diagnosis not present

## 2018-07-19 DIAGNOSIS — Z992 Dependence on renal dialysis: Secondary | ICD-10-CM | POA: Diagnosis not present

## 2018-07-19 DIAGNOSIS — L89892 Pressure ulcer of other site, stage 2: Secondary | ICD-10-CM | POA: Diagnosis not present

## 2018-07-19 DIAGNOSIS — E1140 Type 2 diabetes mellitus with diabetic neuropathy, unspecified: Secondary | ICD-10-CM | POA: Diagnosis not present

## 2018-07-21 DIAGNOSIS — Z992 Dependence on renal dialysis: Secondary | ICD-10-CM | POA: Diagnosis not present

## 2018-07-21 DIAGNOSIS — N186 End stage renal disease: Secondary | ICD-10-CM | POA: Diagnosis not present

## 2018-07-23 DIAGNOSIS — N186 End stage renal disease: Secondary | ICD-10-CM | POA: Diagnosis not present

## 2018-07-23 DIAGNOSIS — Z992 Dependence on renal dialysis: Secondary | ICD-10-CM | POA: Diagnosis not present

## 2018-07-26 DIAGNOSIS — D510 Vitamin B12 deficiency anemia due to intrinsic factor deficiency: Secondary | ICD-10-CM | POA: Diagnosis not present

## 2018-07-26 DIAGNOSIS — N186 End stage renal disease: Secondary | ICD-10-CM | POA: Diagnosis not present

## 2018-07-26 DIAGNOSIS — Z992 Dependence on renal dialysis: Secondary | ICD-10-CM | POA: Diagnosis not present

## 2018-07-28 DIAGNOSIS — N186 End stage renal disease: Secondary | ICD-10-CM | POA: Diagnosis not present

## 2018-07-28 DIAGNOSIS — Z992 Dependence on renal dialysis: Secondary | ICD-10-CM | POA: Diagnosis not present

## 2018-07-30 DIAGNOSIS — N186 End stage renal disease: Secondary | ICD-10-CM | POA: Diagnosis not present

## 2018-07-30 DIAGNOSIS — Z992 Dependence on renal dialysis: Secondary | ICD-10-CM | POA: Diagnosis not present

## 2018-08-02 DIAGNOSIS — N186 End stage renal disease: Secondary | ICD-10-CM | POA: Diagnosis not present

## 2018-08-02 DIAGNOSIS — Z992 Dependence on renal dialysis: Secondary | ICD-10-CM | POA: Diagnosis not present

## 2018-08-04 DIAGNOSIS — N186 End stage renal disease: Secondary | ICD-10-CM | POA: Diagnosis not present

## 2018-08-04 DIAGNOSIS — Z992 Dependence on renal dialysis: Secondary | ICD-10-CM | POA: Diagnosis not present

## 2018-08-06 DIAGNOSIS — Z992 Dependence on renal dialysis: Secondary | ICD-10-CM | POA: Diagnosis not present

## 2018-08-06 DIAGNOSIS — N186 End stage renal disease: Secondary | ICD-10-CM | POA: Diagnosis not present

## 2018-08-09 DIAGNOSIS — N186 End stage renal disease: Secondary | ICD-10-CM | POA: Diagnosis not present

## 2018-08-09 DIAGNOSIS — Z992 Dependence on renal dialysis: Secondary | ICD-10-CM | POA: Diagnosis not present

## 2018-08-11 DIAGNOSIS — Z992 Dependence on renal dialysis: Secondary | ICD-10-CM | POA: Diagnosis not present

## 2018-08-11 DIAGNOSIS — N186 End stage renal disease: Secondary | ICD-10-CM | POA: Diagnosis not present

## 2018-08-13 DIAGNOSIS — N186 End stage renal disease: Secondary | ICD-10-CM | POA: Diagnosis not present

## 2018-08-13 DIAGNOSIS — Z992 Dependence on renal dialysis: Secondary | ICD-10-CM | POA: Diagnosis not present

## 2018-08-14 DIAGNOSIS — Z992 Dependence on renal dialysis: Secondary | ICD-10-CM | POA: Diagnosis not present

## 2018-08-14 DIAGNOSIS — N186 End stage renal disease: Secondary | ICD-10-CM | POA: Diagnosis not present

## 2018-08-16 DIAGNOSIS — Z992 Dependence on renal dialysis: Secondary | ICD-10-CM | POA: Diagnosis not present

## 2018-08-16 DIAGNOSIS — N186 End stage renal disease: Secondary | ICD-10-CM | POA: Diagnosis not present

## 2018-08-18 DIAGNOSIS — Z992 Dependence on renal dialysis: Secondary | ICD-10-CM | POA: Diagnosis not present

## 2018-08-18 DIAGNOSIS — N186 End stage renal disease: Secondary | ICD-10-CM | POA: Diagnosis not present

## 2018-08-20 DIAGNOSIS — N186 End stage renal disease: Secondary | ICD-10-CM | POA: Diagnosis not present

## 2018-08-20 DIAGNOSIS — Z992 Dependence on renal dialysis: Secondary | ICD-10-CM | POA: Diagnosis not present

## 2018-08-23 DIAGNOSIS — Z992 Dependence on renal dialysis: Secondary | ICD-10-CM | POA: Diagnosis not present

## 2018-08-23 DIAGNOSIS — N186 End stage renal disease: Secondary | ICD-10-CM | POA: Diagnosis not present

## 2018-08-24 DIAGNOSIS — N186 End stage renal disease: Secondary | ICD-10-CM | POA: Diagnosis not present

## 2018-08-24 DIAGNOSIS — I10 Essential (primary) hypertension: Secondary | ICD-10-CM | POA: Diagnosis not present

## 2018-08-24 DIAGNOSIS — E1122 Type 2 diabetes mellitus with diabetic chronic kidney disease: Secondary | ICD-10-CM | POA: Diagnosis not present

## 2018-08-25 DIAGNOSIS — Z992 Dependence on renal dialysis: Secondary | ICD-10-CM | POA: Diagnosis not present

## 2018-08-25 DIAGNOSIS — N186 End stage renal disease: Secondary | ICD-10-CM | POA: Diagnosis not present

## 2018-08-27 DIAGNOSIS — N186 End stage renal disease: Secondary | ICD-10-CM | POA: Diagnosis not present

## 2018-08-27 DIAGNOSIS — Z992 Dependence on renal dialysis: Secondary | ICD-10-CM | POA: Diagnosis not present

## 2018-08-30 DIAGNOSIS — Z992 Dependence on renal dialysis: Secondary | ICD-10-CM | POA: Diagnosis not present

## 2018-08-30 DIAGNOSIS — N186 End stage renal disease: Secondary | ICD-10-CM | POA: Diagnosis not present

## 2018-09-01 DIAGNOSIS — N186 End stage renal disease: Secondary | ICD-10-CM | POA: Diagnosis not present

## 2018-09-01 DIAGNOSIS — Z992 Dependence on renal dialysis: Secondary | ICD-10-CM | POA: Diagnosis not present

## 2018-09-03 DIAGNOSIS — Z992 Dependence on renal dialysis: Secondary | ICD-10-CM | POA: Diagnosis not present

## 2018-09-03 DIAGNOSIS — N186 End stage renal disease: Secondary | ICD-10-CM | POA: Diagnosis not present

## 2018-09-06 DIAGNOSIS — N186 End stage renal disease: Secondary | ICD-10-CM | POA: Diagnosis not present

## 2018-09-06 DIAGNOSIS — Z992 Dependence on renal dialysis: Secondary | ICD-10-CM | POA: Diagnosis not present

## 2018-09-08 DIAGNOSIS — Z992 Dependence on renal dialysis: Secondary | ICD-10-CM | POA: Diagnosis not present

## 2018-09-08 DIAGNOSIS — N186 End stage renal disease: Secondary | ICD-10-CM | POA: Diagnosis not present

## 2018-09-10 DIAGNOSIS — Z992 Dependence on renal dialysis: Secondary | ICD-10-CM | POA: Diagnosis not present

## 2018-09-10 DIAGNOSIS — N186 End stage renal disease: Secondary | ICD-10-CM | POA: Diagnosis not present

## 2018-09-13 DIAGNOSIS — Z992 Dependence on renal dialysis: Secondary | ICD-10-CM | POA: Diagnosis not present

## 2018-09-13 DIAGNOSIS — N186 End stage renal disease: Secondary | ICD-10-CM | POA: Diagnosis not present

## 2018-09-14 DIAGNOSIS — N186 End stage renal disease: Secondary | ICD-10-CM | POA: Diagnosis not present

## 2018-09-14 DIAGNOSIS — Z992 Dependence on renal dialysis: Secondary | ICD-10-CM | POA: Diagnosis not present

## 2018-09-15 DIAGNOSIS — N186 End stage renal disease: Secondary | ICD-10-CM | POA: Diagnosis not present

## 2018-09-15 DIAGNOSIS — Z992 Dependence on renal dialysis: Secondary | ICD-10-CM | POA: Diagnosis not present

## 2018-09-17 DIAGNOSIS — N186 End stage renal disease: Secondary | ICD-10-CM | POA: Diagnosis not present

## 2018-09-17 DIAGNOSIS — Z992 Dependence on renal dialysis: Secondary | ICD-10-CM | POA: Diagnosis not present

## 2018-09-20 DIAGNOSIS — N186 End stage renal disease: Secondary | ICD-10-CM | POA: Diagnosis not present

## 2018-09-20 DIAGNOSIS — E1140 Type 2 diabetes mellitus with diabetic neuropathy, unspecified: Secondary | ICD-10-CM | POA: Diagnosis not present

## 2018-09-20 DIAGNOSIS — Z992 Dependence on renal dialysis: Secondary | ICD-10-CM | POA: Diagnosis not present

## 2018-09-20 DIAGNOSIS — L89892 Pressure ulcer of other site, stage 2: Secondary | ICD-10-CM | POA: Diagnosis not present

## 2018-09-22 DIAGNOSIS — Z992 Dependence on renal dialysis: Secondary | ICD-10-CM | POA: Diagnosis not present

## 2018-09-22 DIAGNOSIS — N186 End stage renal disease: Secondary | ICD-10-CM | POA: Diagnosis not present

## 2018-09-24 DIAGNOSIS — N186 End stage renal disease: Secondary | ICD-10-CM | POA: Diagnosis not present

## 2018-09-24 DIAGNOSIS — Z992 Dependence on renal dialysis: Secondary | ICD-10-CM | POA: Diagnosis not present

## 2018-09-27 DIAGNOSIS — N186 End stage renal disease: Secondary | ICD-10-CM | POA: Diagnosis not present

## 2018-09-27 DIAGNOSIS — Z992 Dependence on renal dialysis: Secondary | ICD-10-CM | POA: Diagnosis not present

## 2018-09-27 DIAGNOSIS — E119 Type 2 diabetes mellitus without complications: Secondary | ICD-10-CM | POA: Diagnosis not present

## 2018-09-29 DIAGNOSIS — B351 Tinea unguium: Secondary | ICD-10-CM | POA: Diagnosis not present

## 2018-09-29 DIAGNOSIS — E1151 Type 2 diabetes mellitus with diabetic peripheral angiopathy without gangrene: Secondary | ICD-10-CM | POA: Diagnosis not present

## 2018-09-29 DIAGNOSIS — M79671 Pain in right foot: Secondary | ICD-10-CM | POA: Diagnosis not present

## 2018-09-29 DIAGNOSIS — L600 Ingrowing nail: Secondary | ICD-10-CM | POA: Diagnosis not present

## 2018-09-29 DIAGNOSIS — N186 End stage renal disease: Secondary | ICD-10-CM | POA: Diagnosis not present

## 2018-09-29 DIAGNOSIS — Z992 Dependence on renal dialysis: Secondary | ICD-10-CM | POA: Diagnosis not present

## 2018-09-29 DIAGNOSIS — E1140 Type 2 diabetes mellitus with diabetic neuropathy, unspecified: Secondary | ICD-10-CM | POA: Diagnosis not present

## 2018-10-01 DIAGNOSIS — Z992 Dependence on renal dialysis: Secondary | ICD-10-CM | POA: Diagnosis not present

## 2018-10-01 DIAGNOSIS — N186 End stage renal disease: Secondary | ICD-10-CM | POA: Diagnosis not present

## 2018-10-04 DIAGNOSIS — N186 End stage renal disease: Secondary | ICD-10-CM | POA: Diagnosis not present

## 2018-10-04 DIAGNOSIS — Z992 Dependence on renal dialysis: Secondary | ICD-10-CM | POA: Diagnosis not present

## 2018-10-06 DIAGNOSIS — Z992 Dependence on renal dialysis: Secondary | ICD-10-CM | POA: Diagnosis not present

## 2018-10-06 DIAGNOSIS — D510 Vitamin B12 deficiency anemia due to intrinsic factor deficiency: Secondary | ICD-10-CM | POA: Diagnosis not present

## 2018-10-06 DIAGNOSIS — N186 End stage renal disease: Secondary | ICD-10-CM | POA: Diagnosis not present

## 2018-10-08 DIAGNOSIS — N186 End stage renal disease: Secondary | ICD-10-CM | POA: Diagnosis not present

## 2018-10-08 DIAGNOSIS — Z992 Dependence on renal dialysis: Secondary | ICD-10-CM | POA: Diagnosis not present

## 2018-10-11 DIAGNOSIS — N186 End stage renal disease: Secondary | ICD-10-CM | POA: Diagnosis not present

## 2018-10-11 DIAGNOSIS — Z992 Dependence on renal dialysis: Secondary | ICD-10-CM | POA: Diagnosis not present

## 2018-10-13 DIAGNOSIS — N186 End stage renal disease: Secondary | ICD-10-CM | POA: Diagnosis not present

## 2018-10-13 DIAGNOSIS — Z992 Dependence on renal dialysis: Secondary | ICD-10-CM | POA: Diagnosis not present

## 2018-10-14 DIAGNOSIS — Z992 Dependence on renal dialysis: Secondary | ICD-10-CM | POA: Diagnosis not present

## 2018-10-14 DIAGNOSIS — N186 End stage renal disease: Secondary | ICD-10-CM | POA: Diagnosis not present

## 2018-10-15 DIAGNOSIS — N186 End stage renal disease: Secondary | ICD-10-CM | POA: Diagnosis not present

## 2018-10-15 DIAGNOSIS — Z992 Dependence on renal dialysis: Secondary | ICD-10-CM | POA: Diagnosis not present

## 2018-10-18 DIAGNOSIS — Z992 Dependence on renal dialysis: Secondary | ICD-10-CM | POA: Diagnosis not present

## 2018-10-18 DIAGNOSIS — N186 End stage renal disease: Secondary | ICD-10-CM | POA: Diagnosis not present

## 2018-10-20 DIAGNOSIS — Z992 Dependence on renal dialysis: Secondary | ICD-10-CM | POA: Diagnosis not present

## 2018-10-20 DIAGNOSIS — N186 End stage renal disease: Secondary | ICD-10-CM | POA: Diagnosis not present

## 2018-10-22 DIAGNOSIS — N186 End stage renal disease: Secondary | ICD-10-CM | POA: Diagnosis not present

## 2018-10-22 DIAGNOSIS — Z992 Dependence on renal dialysis: Secondary | ICD-10-CM | POA: Diagnosis not present

## 2018-10-25 DIAGNOSIS — Z992 Dependence on renal dialysis: Secondary | ICD-10-CM | POA: Diagnosis not present

## 2018-10-25 DIAGNOSIS — N186 End stage renal disease: Secondary | ICD-10-CM | POA: Diagnosis not present

## 2018-10-27 DIAGNOSIS — Z992 Dependence on renal dialysis: Secondary | ICD-10-CM | POA: Diagnosis not present

## 2018-10-27 DIAGNOSIS — N186 End stage renal disease: Secondary | ICD-10-CM | POA: Diagnosis not present

## 2018-10-29 DIAGNOSIS — N186 End stage renal disease: Secondary | ICD-10-CM | POA: Diagnosis not present

## 2018-10-29 DIAGNOSIS — Z992 Dependence on renal dialysis: Secondary | ICD-10-CM | POA: Diagnosis not present

## 2018-11-01 DIAGNOSIS — N186 End stage renal disease: Secondary | ICD-10-CM | POA: Diagnosis not present

## 2018-11-01 DIAGNOSIS — Z992 Dependence on renal dialysis: Secondary | ICD-10-CM | POA: Diagnosis not present

## 2018-11-02 DIAGNOSIS — D510 Vitamin B12 deficiency anemia due to intrinsic factor deficiency: Secondary | ICD-10-CM | POA: Diagnosis not present

## 2018-11-03 DIAGNOSIS — N186 End stage renal disease: Secondary | ICD-10-CM | POA: Diagnosis not present

## 2018-11-03 DIAGNOSIS — Z992 Dependence on renal dialysis: Secondary | ICD-10-CM | POA: Diagnosis not present

## 2018-11-05 ENCOUNTER — Other Ambulatory Visit: Payer: Self-pay

## 2018-11-05 DIAGNOSIS — Z992 Dependence on renal dialysis: Secondary | ICD-10-CM

## 2018-11-05 DIAGNOSIS — N186 End stage renal disease: Secondary | ICD-10-CM

## 2018-11-08 DIAGNOSIS — Z992 Dependence on renal dialysis: Secondary | ICD-10-CM | POA: Diagnosis not present

## 2018-11-08 DIAGNOSIS — N186 End stage renal disease: Secondary | ICD-10-CM | POA: Diagnosis not present

## 2018-11-09 ENCOUNTER — Encounter: Payer: Self-pay | Admitting: Vascular Surgery

## 2018-11-09 ENCOUNTER — Ambulatory Visit (INDEPENDENT_AMBULATORY_CARE_PROVIDER_SITE_OTHER): Payer: Medicare Other | Admitting: Vascular Surgery

## 2018-11-09 ENCOUNTER — Encounter: Payer: Medicare Other | Admitting: Vascular Surgery

## 2018-11-09 ENCOUNTER — Other Ambulatory Visit: Payer: Self-pay

## 2018-11-09 ENCOUNTER — Inpatient Hospital Stay (HOSPITAL_COMMUNITY): Admission: RE | Admit: 2018-11-09 | Payer: Medicare Other | Source: Ambulatory Visit

## 2018-11-09 ENCOUNTER — Ambulatory Visit (HOSPITAL_COMMUNITY)
Admission: RE | Admit: 2018-11-09 | Discharge: 2018-11-09 | Disposition: A | Discharge status: Home / Self Care | Payer: Medicare Other | Source: Ambulatory Visit | Attending: Vascular Surgery | Admitting: Vascular Surgery

## 2018-11-09 VITALS — BP 176/73 | HR 79 | Temp 97.9°F | Resp 20 | Ht 67.0 in | Wt 185.6 lb

## 2018-11-09 DIAGNOSIS — Z992 Dependence on renal dialysis: Secondary | ICD-10-CM

## 2018-11-09 DIAGNOSIS — N186 End stage renal disease: Secondary | ICD-10-CM | POA: Insufficient documentation

## 2018-11-09 NOTE — Progress Notes (Signed)
Vascular and Vein Specialist of [place]  Patient name: [name] MRN: [id number] DOB: [birth year] Sex: female  REASON FOR CONSULT: Evaluation of right arm fistula and hand symptoms  HPI: [name] is a 78 y.o. female, who is here today for evaluation of right hand pain.  She is a very pleasant 78 year old with a long history of end-stage renal disease.  She had a attempt at a right radiocephalic fistula which did not mature.  She then had placement of a right basilic vein transposition fistula on August 2015 by Dr. Kellie Simmering.  She has had excellent maturation of this and is used this for nearly 5 years with no intervention since that time.  She reports now that on occasionally on her dialysis sessions towards the end of the 4-hour run, she has pain in her right hand.  This is significantly discomforting to her and sometimes has to come off early.  She reports that this does not always happen and feels that it may be related to the access of the fistula.  She denies any bleeding and is no evidence that she is having difficulty with the run itself.  Past Medical History:  Diagnosis Date  . Anemia   . Cataract   . Chronic kidney disease   . CVA (cerebral infarction)   . Diabetes mellitus    Type 2  . Diabetes mellitus without complication ([place])   . Dialysis patient ([place])   . Dialysis patient ([place])    M, W, F  . Fistula    R arm  . GERD (gastroesophageal reflux disease)   . Hypertension   . Renal disorder   . Shortness of breath   . Stroke [facility])    right side weakness    Family History  Problem Relation Age of Onset  . Cancer Sister   . Cancer Brother   . Anesthesia problems Neg Hx   . Hypotension Neg Hx   . Malignant hyperthermia Neg Hx   . Pseudochol deficiency Neg Hx     SOCIAL HISTORY: Social History   Socioeconomic History  . Marital status: Widowed    Spouse name: Not on file  . Number of children: Not on file  .  Years of education: Not on file  . Highest education level: Not on file  Occupational History  . Not on file  Social Needs  . Financial resource strain: Not on file  . Food insecurity:    Worry: Not on file    Inability: Not on file  . Transportation needs:    Medical: Not on file    Non-medical: Not on file  Tobacco Use  . Smoking status: Never Smoker  . Smokeless tobacco: Never Used  Substance and Sexual Activity  . Alcohol use: No  . Drug use: No  . Sexual activity: Not on file  Lifestyle  . Physical activity:    Days per week: Not on file    Minutes per session: Not on file  . Stress: Not on file  Relationships  . Social connections:    Talks on phone: Not on file    Gets together: Not on file    Attends religious service: Not on file    Active member of club or organization: Not on file    Attends meetings of clubs or organizations: Not on file    Relationship status: Not on file  . Intimate partner violence:    Fear of current or ex partner: Not on  file    Emotionally abused: Not on file    Physically abused: Not on file    Forced sexual activity: Not on file  Other Topics Concern  . Not on file  Social History Narrative   ** Merged History Encounter **        Allergies  Allergen Reactions  . Ambien [Zolpidem] Other (See Comments)    Hallucinations   . Reglan [Metoclopramide] Other (See Comments)    Hallucinations   . Reglan [Metoclopramide] Other (See Comments)    "makes me crazy"    Current Outpatient Medications  Medication Sig Dispense Refill  . albuterol (PROVENTIL HFA;VENTOLIN HFA) 108 (90 Base) MCG/ACT inhaler Inhale 2 puffs into the lungs every 6 (six) hours as needed for wheezing or shortness of breath. 1 Inhaler 2  . aspirin 81 MG chewable tablet Chew 2 tablets (162 mg total) by mouth daily.    . BD INSULIN SYRINGE U/F 31G X 5/16" 0.3 ML MISC 2 (two) times daily. as directed    . cyanocobalamin (,VITAMIN B-12,) 1000 MCG/ML injection Inject 1  mL into the muscle every 30 (thirty) days.  11  . gabapentin (NEURONTIN) 300 MG capsule     . ibuprofen (ADVIL,MOTRIN) 200 MG tablet Take 400 mg by mouth every 6 (six) hours as needed for headache or moderate pain.     Marland Kitchen insulin NPH Human (HUMULIN N,NOVOLIN N) 100 UNIT/ML injection Inject 14 Units into the skin 2 (two) times daily before a meal.     . labetalol (NORMODYNE) 300 MG tablet Take 300 mg by mouth 2 (two) times daily.  3  . levETIRAcetam (KEPPRA) 250 MG tablet Take 250 mg by mouth 2 (two) times daily.     Marland Kitchen lidocaine-prilocaine (EMLA) cream Apply 1 application topically every Monday, Wednesday, and Friday.     . multivitamin (RENA-VIT) TABS tablet Take 1 tablet by mouth at bedtime. 30 tablet 0  . sevelamer carbonate (RENVELA) 800 MG tablet Take 800 mg by mouth 3 (three) times daily with meals.   11   No current facility-administered medications for this visit.     REVIEW OF SYSTEMS:  [X]  denotes positive finding, [ ]  denotes negative finding Cardiac  Comments:  Chest pain or chest pressure:    Shortness of breath upon exertion:    Short of breath when lying flat:    Irregular heart rhythm:        Vascular    Pain in calf, thigh, or hip brought on by ambulation:    Pain in feet at night that wakes you up from your sleep:     Blood clot in your veins:    Leg swelling:         Pulmonary    Oxygen at home:    Productive cough:     Wheezing:         Neurologic    Sudden weakness in arms or legs:     Sudden numbness in arms or legs:     Sudden onset of difficulty speaking or slurred speech:    Temporary loss of vision in one eye:     Problems with dizziness:         Gastrointestinal    Blood in stool:     Vomited blood:         Genitourinary    Burning when urinating:     Blood in urine:        Psychiatric    Major depression:  Hematologic    Bleeding problems:    Problems with blood clotting too easily:        Skin    Rashes or ulcers:         Constitutional    Fever or chills:      PHYSICAL EXAM: Vitals:   11/09/18 1353  BP: (!) 176/73  Pulse: 79  Resp: 20  Temp: 97.9 F (36.6 C)  SpO2: 98%  Weight: 185 lb 9.6 oz (84.2 kg)  Height: 5\' 7"  (1.702 m)    GENERAL: The patient is a well-nourished female, in no acute distress. The vital signs are documented above. CARDIOVASCULAR: 2+ left radial pulse.  1+ right radial pulse.  She has excellent upper arm fistula maturation with no evidence of skin breakdown PULMONARY: There is good air exchange  ABDOMEN: Soft and non-tender  MUSCULOSKELETAL: There are no major deformities or cyanosis. NEUROLOGIC: No focal weakness or paresthesias are detected. SKIN: There are no ulcers or rashes noted. PSYCHIATRIC: The patient has a normal affect.  DATA:  Duplex of her right arm access shows good maturation throughout its course.  She does have elevated velocities at the brachial artery to basilic vein anastomosis.  MEDICAL ISSUES: Long discussion with the patient.  Her symptoms are most consistent with steal syndrome.  I explained that the next option would be fistulogram to determine if she has outflow stenosis in the artery or if there treatment options for her steal symptoms.  She reports that she is able to tolerate this level of discomfort currently since it is only occasional.  She will continue to monitor this and if she does have intolerable discomfort, we will schedule her for outpatient fistulogram at [facility] on a nondialysis day.  Otherwise she will see [place] on an as-needed basis   Rosetta Posner, MD [facility] Vascular and Vein Specialists of [facility] Tel [phone] Pager [phone]

## 2018-11-10 DIAGNOSIS — Z992 Dependence on renal dialysis: Secondary | ICD-10-CM | POA: Diagnosis not present

## 2018-11-10 DIAGNOSIS — N186 End stage renal disease: Secondary | ICD-10-CM | POA: Diagnosis not present

## 2018-11-12 DIAGNOSIS — Z992 Dependence on renal dialysis: Secondary | ICD-10-CM | POA: Diagnosis not present

## 2018-11-12 DIAGNOSIS — N186 End stage renal disease: Secondary | ICD-10-CM | POA: Diagnosis not present

## 2018-11-14 DIAGNOSIS — Z992 Dependence on renal dialysis: Secondary | ICD-10-CM | POA: Diagnosis not present

## 2018-11-14 DIAGNOSIS — N186 End stage renal disease: Secondary | ICD-10-CM | POA: Diagnosis not present

## 2018-11-15 DIAGNOSIS — N186 End stage renal disease: Secondary | ICD-10-CM | POA: Diagnosis not present

## 2018-11-15 DIAGNOSIS — Z992 Dependence on renal dialysis: Secondary | ICD-10-CM | POA: Diagnosis not present

## 2018-11-17 DIAGNOSIS — N186 End stage renal disease: Secondary | ICD-10-CM | POA: Diagnosis not present

## 2018-11-17 DIAGNOSIS — Z992 Dependence on renal dialysis: Secondary | ICD-10-CM | POA: Diagnosis not present

## 2018-11-19 DIAGNOSIS — Z992 Dependence on renal dialysis: Secondary | ICD-10-CM | POA: Diagnosis not present

## 2018-11-19 DIAGNOSIS — N186 End stage renal disease: Secondary | ICD-10-CM | POA: Diagnosis not present

## 2018-11-22 DIAGNOSIS — N186 End stage renal disease: Secondary | ICD-10-CM | POA: Diagnosis not present

## 2018-11-22 DIAGNOSIS — Z992 Dependence on renal dialysis: Secondary | ICD-10-CM | POA: Diagnosis not present

## 2018-11-24 DIAGNOSIS — N186 End stage renal disease: Secondary | ICD-10-CM | POA: Diagnosis not present

## 2018-11-24 DIAGNOSIS — Z992 Dependence on renal dialysis: Secondary | ICD-10-CM | POA: Diagnosis not present

## 2018-11-26 DIAGNOSIS — Z992 Dependence on renal dialysis: Secondary | ICD-10-CM | POA: Diagnosis not present

## 2018-11-26 DIAGNOSIS — N186 End stage renal disease: Secondary | ICD-10-CM | POA: Diagnosis not present

## 2018-11-29 DIAGNOSIS — Z992 Dependence on renal dialysis: Secondary | ICD-10-CM | POA: Diagnosis not present

## 2018-11-29 DIAGNOSIS — N186 End stage renal disease: Secondary | ICD-10-CM | POA: Diagnosis not present

## 2018-11-30 DIAGNOSIS — I10 Essential (primary) hypertension: Secondary | ICD-10-CM | POA: Diagnosis not present

## 2018-11-30 DIAGNOSIS — D510 Vitamin B12 deficiency anemia due to intrinsic factor deficiency: Secondary | ICD-10-CM | POA: Diagnosis not present

## 2018-11-30 DIAGNOSIS — E1122 Type 2 diabetes mellitus with diabetic chronic kidney disease: Secondary | ICD-10-CM | POA: Diagnosis not present

## 2018-11-30 DIAGNOSIS — N186 End stage renal disease: Secondary | ICD-10-CM | POA: Diagnosis not present

## 2018-12-01 DIAGNOSIS — N186 End stage renal disease: Secondary | ICD-10-CM | POA: Diagnosis not present

## 2018-12-01 DIAGNOSIS — Z992 Dependence on renal dialysis: Secondary | ICD-10-CM | POA: Diagnosis not present

## 2018-12-03 DIAGNOSIS — Z992 Dependence on renal dialysis: Secondary | ICD-10-CM | POA: Diagnosis not present

## 2018-12-03 DIAGNOSIS — N186 End stage renal disease: Secondary | ICD-10-CM | POA: Diagnosis not present

## 2018-12-06 DIAGNOSIS — Z992 Dependence on renal dialysis: Secondary | ICD-10-CM | POA: Diagnosis not present

## 2018-12-06 DIAGNOSIS — N186 End stage renal disease: Secondary | ICD-10-CM | POA: Diagnosis not present

## 2018-12-07 DIAGNOSIS — E1140 Type 2 diabetes mellitus with diabetic neuropathy, unspecified: Secondary | ICD-10-CM | POA: Diagnosis not present

## 2018-12-07 DIAGNOSIS — E1151 Type 2 diabetes mellitus with diabetic peripheral angiopathy without gangrene: Secondary | ICD-10-CM | POA: Diagnosis not present

## 2018-12-07 DIAGNOSIS — M79671 Pain in right foot: Secondary | ICD-10-CM | POA: Diagnosis not present

## 2018-12-07 DIAGNOSIS — B351 Tinea unguium: Secondary | ICD-10-CM | POA: Diagnosis not present

## 2018-12-07 DIAGNOSIS — L600 Ingrowing nail: Secondary | ICD-10-CM | POA: Diagnosis not present

## 2018-12-08 DIAGNOSIS — N186 End stage renal disease: Secondary | ICD-10-CM | POA: Diagnosis not present

## 2018-12-08 DIAGNOSIS — Z992 Dependence on renal dialysis: Secondary | ICD-10-CM | POA: Diagnosis not present

## 2018-12-10 DIAGNOSIS — N186 End stage renal disease: Secondary | ICD-10-CM | POA: Diagnosis not present

## 2018-12-10 DIAGNOSIS — Z992 Dependence on renal dialysis: Secondary | ICD-10-CM | POA: Diagnosis not present

## 2018-12-13 DIAGNOSIS — N186 End stage renal disease: Secondary | ICD-10-CM | POA: Diagnosis not present

## 2018-12-13 DIAGNOSIS — J219 Acute bronchiolitis, unspecified: Secondary | ICD-10-CM | POA: Diagnosis not present

## 2018-12-13 DIAGNOSIS — E1165 Type 2 diabetes mellitus with hyperglycemia: Secondary | ICD-10-CM | POA: Diagnosis not present

## 2018-12-13 DIAGNOSIS — Z992 Dependence on renal dialysis: Secondary | ICD-10-CM | POA: Diagnosis not present

## 2018-12-14 DIAGNOSIS — Z992 Dependence on renal dialysis: Secondary | ICD-10-CM | POA: Diagnosis not present

## 2018-12-14 DIAGNOSIS — N186 End stage renal disease: Secondary | ICD-10-CM | POA: Diagnosis not present

## 2018-12-15 DIAGNOSIS — Z992 Dependence on renal dialysis: Secondary | ICD-10-CM | POA: Diagnosis not present

## 2018-12-15 DIAGNOSIS — N186 End stage renal disease: Secondary | ICD-10-CM | POA: Diagnosis not present

## 2018-12-17 DIAGNOSIS — N186 End stage renal disease: Secondary | ICD-10-CM | POA: Diagnosis not present

## 2018-12-17 DIAGNOSIS — Z992 Dependence on renal dialysis: Secondary | ICD-10-CM | POA: Diagnosis not present

## 2018-12-20 DIAGNOSIS — I10 Essential (primary) hypertension: Secondary | ICD-10-CM | POA: Diagnosis not present

## 2018-12-20 DIAGNOSIS — N2581 Secondary hyperparathyroidism of renal origin: Secondary | ICD-10-CM | POA: Diagnosis not present

## 2018-12-20 DIAGNOSIS — Z992 Dependence on renal dialysis: Secondary | ICD-10-CM | POA: Diagnosis not present

## 2018-12-20 DIAGNOSIS — J219 Acute bronchiolitis, unspecified: Secondary | ICD-10-CM | POA: Diagnosis not present

## 2018-12-20 DIAGNOSIS — N186 End stage renal disease: Secondary | ICD-10-CM | POA: Diagnosis not present

## 2018-12-20 DIAGNOSIS — E1122 Type 2 diabetes mellitus with diabetic chronic kidney disease: Secondary | ICD-10-CM | POA: Diagnosis not present

## 2018-12-22 DIAGNOSIS — N186 End stage renal disease: Secondary | ICD-10-CM | POA: Diagnosis not present

## 2018-12-22 DIAGNOSIS — Z992 Dependence on renal dialysis: Secondary | ICD-10-CM | POA: Diagnosis not present

## 2018-12-24 DIAGNOSIS — Z992 Dependence on renal dialysis: Secondary | ICD-10-CM | POA: Diagnosis not present

## 2018-12-24 DIAGNOSIS — N186 End stage renal disease: Secondary | ICD-10-CM | POA: Diagnosis not present

## 2018-12-27 DIAGNOSIS — N186 End stage renal disease: Secondary | ICD-10-CM | POA: Diagnosis not present

## 2018-12-27 DIAGNOSIS — Z992 Dependence on renal dialysis: Secondary | ICD-10-CM | POA: Diagnosis not present

## 2018-12-27 DIAGNOSIS — N2581 Secondary hyperparathyroidism of renal origin: Secondary | ICD-10-CM | POA: Diagnosis not present

## 2018-12-27 DIAGNOSIS — D509 Iron deficiency anemia, unspecified: Secondary | ICD-10-CM | POA: Diagnosis not present

## 2018-12-27 DIAGNOSIS — E119 Type 2 diabetes mellitus without complications: Secondary | ICD-10-CM | POA: Diagnosis not present

## 2018-12-29 DIAGNOSIS — N186 End stage renal disease: Secondary | ICD-10-CM | POA: Diagnosis not present

## 2018-12-29 DIAGNOSIS — Z992 Dependence on renal dialysis: Secondary | ICD-10-CM | POA: Diagnosis not present

## 2018-12-31 DIAGNOSIS — Z992 Dependence on renal dialysis: Secondary | ICD-10-CM | POA: Diagnosis not present

## 2018-12-31 DIAGNOSIS — N186 End stage renal disease: Secondary | ICD-10-CM | POA: Diagnosis not present

## 2019-01-03 DIAGNOSIS — N186 End stage renal disease: Secondary | ICD-10-CM | POA: Diagnosis not present

## 2019-01-03 DIAGNOSIS — Z992 Dependence on renal dialysis: Secondary | ICD-10-CM | POA: Diagnosis not present

## 2019-01-05 DIAGNOSIS — Z992 Dependence on renal dialysis: Secondary | ICD-10-CM | POA: Diagnosis not present

## 2019-01-05 DIAGNOSIS — N186 End stage renal disease: Secondary | ICD-10-CM | POA: Diagnosis not present

## 2019-01-07 DIAGNOSIS — Z992 Dependence on renal dialysis: Secondary | ICD-10-CM | POA: Diagnosis not present

## 2019-01-07 DIAGNOSIS — N186 End stage renal disease: Secondary | ICD-10-CM | POA: Diagnosis not present

## 2019-01-10 DIAGNOSIS — N2581 Secondary hyperparathyroidism of renal origin: Secondary | ICD-10-CM | POA: Diagnosis not present

## 2019-01-10 DIAGNOSIS — Z992 Dependence on renal dialysis: Secondary | ICD-10-CM | POA: Diagnosis not present

## 2019-01-10 DIAGNOSIS — D631 Anemia in chronic kidney disease: Secondary | ICD-10-CM | POA: Diagnosis not present

## 2019-01-10 DIAGNOSIS — N186 End stage renal disease: Secondary | ICD-10-CM | POA: Diagnosis not present

## 2019-01-12 DIAGNOSIS — Z992 Dependence on renal dialysis: Secondary | ICD-10-CM | POA: Diagnosis not present

## 2019-01-12 DIAGNOSIS — N186 End stage renal disease: Secondary | ICD-10-CM | POA: Diagnosis not present

## 2019-01-14 DIAGNOSIS — Z992 Dependence on renal dialysis: Secondary | ICD-10-CM | POA: Diagnosis not present

## 2019-01-14 DIAGNOSIS — N186 End stage renal disease: Secondary | ICD-10-CM | POA: Diagnosis not present

## 2019-01-17 DIAGNOSIS — N186 End stage renal disease: Secondary | ICD-10-CM | POA: Diagnosis not present

## 2019-01-17 DIAGNOSIS — Z992 Dependence on renal dialysis: Secondary | ICD-10-CM | POA: Diagnosis not present

## 2019-01-19 DIAGNOSIS — Z992 Dependence on renal dialysis: Secondary | ICD-10-CM | POA: Diagnosis not present

## 2019-01-19 DIAGNOSIS — N186 End stage renal disease: Secondary | ICD-10-CM | POA: Diagnosis not present

## 2019-01-21 DIAGNOSIS — N186 End stage renal disease: Secondary | ICD-10-CM | POA: Diagnosis not present

## 2019-01-21 DIAGNOSIS — Z992 Dependence on renal dialysis: Secondary | ICD-10-CM | POA: Diagnosis not present

## 2019-01-24 DIAGNOSIS — Z992 Dependence on renal dialysis: Secondary | ICD-10-CM | POA: Diagnosis not present

## 2019-01-24 DIAGNOSIS — D509 Iron deficiency anemia, unspecified: Secondary | ICD-10-CM | POA: Diagnosis not present

## 2019-01-24 DIAGNOSIS — N186 End stage renal disease: Secondary | ICD-10-CM | POA: Diagnosis not present

## 2019-01-24 DIAGNOSIS — N2581 Secondary hyperparathyroidism of renal origin: Secondary | ICD-10-CM | POA: Diagnosis not present

## 2019-01-26 DIAGNOSIS — Z992 Dependence on renal dialysis: Secondary | ICD-10-CM | POA: Diagnosis not present

## 2019-01-26 DIAGNOSIS — N186 End stage renal disease: Secondary | ICD-10-CM | POA: Diagnosis not present

## 2019-01-28 DIAGNOSIS — Z992 Dependence on renal dialysis: Secondary | ICD-10-CM | POA: Diagnosis not present

## 2019-01-28 DIAGNOSIS — N186 End stage renal disease: Secondary | ICD-10-CM | POA: Diagnosis not present

## 2019-01-31 DIAGNOSIS — Z992 Dependence on renal dialysis: Secondary | ICD-10-CM | POA: Diagnosis not present

## 2019-01-31 DIAGNOSIS — N186 End stage renal disease: Secondary | ICD-10-CM | POA: Diagnosis not present

## 2019-02-02 DIAGNOSIS — Z992 Dependence on renal dialysis: Secondary | ICD-10-CM | POA: Diagnosis not present

## 2019-02-02 DIAGNOSIS — N186 End stage renal disease: Secondary | ICD-10-CM | POA: Diagnosis not present

## 2019-02-04 DIAGNOSIS — N186 End stage renal disease: Secondary | ICD-10-CM | POA: Diagnosis not present

## 2019-02-04 DIAGNOSIS — Z992 Dependence on renal dialysis: Secondary | ICD-10-CM | POA: Diagnosis not present

## 2019-02-07 DIAGNOSIS — N186 End stage renal disease: Secondary | ICD-10-CM | POA: Diagnosis not present

## 2019-02-07 DIAGNOSIS — Z992 Dependence on renal dialysis: Secondary | ICD-10-CM | POA: Diagnosis not present

## 2019-02-09 DIAGNOSIS — Z992 Dependence on renal dialysis: Secondary | ICD-10-CM | POA: Diagnosis not present

## 2019-02-09 DIAGNOSIS — N186 End stage renal disease: Secondary | ICD-10-CM | POA: Diagnosis not present

## 2019-02-11 DIAGNOSIS — N186 End stage renal disease: Secondary | ICD-10-CM | POA: Diagnosis not present

## 2019-02-11 DIAGNOSIS — Z992 Dependence on renal dialysis: Secondary | ICD-10-CM | POA: Diagnosis not present

## 2019-02-14 DIAGNOSIS — Z992 Dependence on renal dialysis: Secondary | ICD-10-CM | POA: Diagnosis not present

## 2019-02-14 DIAGNOSIS — N186 End stage renal disease: Secondary | ICD-10-CM | POA: Diagnosis not present

## 2019-02-16 DIAGNOSIS — Z23 Encounter for immunization: Secondary | ICD-10-CM | POA: Diagnosis not present

## 2019-02-16 DIAGNOSIS — N186 End stage renal disease: Secondary | ICD-10-CM | POA: Diagnosis not present

## 2019-02-16 DIAGNOSIS — Z992 Dependence on renal dialysis: Secondary | ICD-10-CM | POA: Diagnosis not present

## 2019-02-18 DIAGNOSIS — Z23 Encounter for immunization: Secondary | ICD-10-CM | POA: Diagnosis not present

## 2019-02-18 DIAGNOSIS — Z992 Dependence on renal dialysis: Secondary | ICD-10-CM | POA: Diagnosis not present

## 2019-02-18 DIAGNOSIS — N186 End stage renal disease: Secondary | ICD-10-CM | POA: Diagnosis not present

## 2019-02-21 DIAGNOSIS — N186 End stage renal disease: Secondary | ICD-10-CM | POA: Diagnosis not present

## 2019-02-21 DIAGNOSIS — Z23 Encounter for immunization: Secondary | ICD-10-CM | POA: Diagnosis not present

## 2019-02-21 DIAGNOSIS — Z992 Dependence on renal dialysis: Secondary | ICD-10-CM | POA: Diagnosis not present

## 2019-02-23 DIAGNOSIS — E1151 Type 2 diabetes mellitus with diabetic peripheral angiopathy without gangrene: Secondary | ICD-10-CM | POA: Diagnosis not present

## 2019-02-23 DIAGNOSIS — Z992 Dependence on renal dialysis: Secondary | ICD-10-CM | POA: Diagnosis not present

## 2019-02-23 DIAGNOSIS — L89893 Pressure ulcer of other site, stage 3: Secondary | ICD-10-CM | POA: Diagnosis not present

## 2019-02-23 DIAGNOSIS — N186 End stage renal disease: Secondary | ICD-10-CM | POA: Diagnosis not present

## 2019-02-23 DIAGNOSIS — Z23 Encounter for immunization: Secondary | ICD-10-CM | POA: Diagnosis not present

## 2019-02-23 DIAGNOSIS — E1140 Type 2 diabetes mellitus with diabetic neuropathy, unspecified: Secondary | ICD-10-CM | POA: Diagnosis not present

## 2019-02-25 DIAGNOSIS — Z992 Dependence on renal dialysis: Secondary | ICD-10-CM | POA: Diagnosis not present

## 2019-02-25 DIAGNOSIS — Z23 Encounter for immunization: Secondary | ICD-10-CM | POA: Diagnosis not present

## 2019-02-25 DIAGNOSIS — N186 End stage renal disease: Secondary | ICD-10-CM | POA: Diagnosis not present

## 2019-02-28 DIAGNOSIS — Z992 Dependence on renal dialysis: Secondary | ICD-10-CM | POA: Diagnosis not present

## 2019-02-28 DIAGNOSIS — Z23 Encounter for immunization: Secondary | ICD-10-CM | POA: Diagnosis not present

## 2019-02-28 DIAGNOSIS — D509 Iron deficiency anemia, unspecified: Secondary | ICD-10-CM | POA: Diagnosis not present

## 2019-02-28 DIAGNOSIS — N2581 Secondary hyperparathyroidism of renal origin: Secondary | ICD-10-CM | POA: Diagnosis not present

## 2019-02-28 DIAGNOSIS — N186 End stage renal disease: Secondary | ICD-10-CM | POA: Diagnosis not present

## 2019-03-02 DIAGNOSIS — Z23 Encounter for immunization: Secondary | ICD-10-CM | POA: Diagnosis not present

## 2019-03-02 DIAGNOSIS — Z992 Dependence on renal dialysis: Secondary | ICD-10-CM | POA: Diagnosis not present

## 2019-03-02 DIAGNOSIS — N186 End stage renal disease: Secondary | ICD-10-CM | POA: Diagnosis not present

## 2019-03-04 DIAGNOSIS — Z23 Encounter for immunization: Secondary | ICD-10-CM | POA: Diagnosis not present

## 2019-03-04 DIAGNOSIS — Z992 Dependence on renal dialysis: Secondary | ICD-10-CM | POA: Diagnosis not present

## 2019-03-04 DIAGNOSIS — N186 End stage renal disease: Secondary | ICD-10-CM | POA: Diagnosis not present

## 2019-03-07 DIAGNOSIS — N186 End stage renal disease: Secondary | ICD-10-CM | POA: Diagnosis not present

## 2019-03-07 DIAGNOSIS — Z992 Dependence on renal dialysis: Secondary | ICD-10-CM | POA: Diagnosis not present

## 2019-03-07 DIAGNOSIS — Z23 Encounter for immunization: Secondary | ICD-10-CM | POA: Diagnosis not present

## 2019-03-09 DIAGNOSIS — M79671 Pain in right foot: Secondary | ICD-10-CM | POA: Diagnosis not present

## 2019-03-09 DIAGNOSIS — Z992 Dependence on renal dialysis: Secondary | ICD-10-CM | POA: Diagnosis not present

## 2019-03-09 DIAGNOSIS — E1151 Type 2 diabetes mellitus with diabetic peripheral angiopathy without gangrene: Secondary | ICD-10-CM | POA: Diagnosis not present

## 2019-03-09 DIAGNOSIS — N186 End stage renal disease: Secondary | ICD-10-CM | POA: Diagnosis not present

## 2019-03-09 DIAGNOSIS — E1140 Type 2 diabetes mellitus with diabetic neuropathy, unspecified: Secondary | ICD-10-CM | POA: Diagnosis not present

## 2019-03-09 DIAGNOSIS — Z23 Encounter for immunization: Secondary | ICD-10-CM | POA: Diagnosis not present

## 2019-03-09 DIAGNOSIS — B351 Tinea unguium: Secondary | ICD-10-CM | POA: Diagnosis not present

## 2019-03-09 DIAGNOSIS — L600 Ingrowing nail: Secondary | ICD-10-CM | POA: Diagnosis not present

## 2019-03-11 DIAGNOSIS — Z23 Encounter for immunization: Secondary | ICD-10-CM | POA: Diagnosis not present

## 2019-03-11 DIAGNOSIS — Z992 Dependence on renal dialysis: Secondary | ICD-10-CM | POA: Diagnosis not present

## 2019-03-11 DIAGNOSIS — N186 End stage renal disease: Secondary | ICD-10-CM | POA: Diagnosis not present

## 2019-03-14 DIAGNOSIS — N186 End stage renal disease: Secondary | ICD-10-CM | POA: Diagnosis not present

## 2019-03-14 DIAGNOSIS — Z23 Encounter for immunization: Secondary | ICD-10-CM | POA: Diagnosis not present

## 2019-03-14 DIAGNOSIS — Z992 Dependence on renal dialysis: Secondary | ICD-10-CM | POA: Diagnosis not present

## 2019-03-16 DIAGNOSIS — N186 End stage renal disease: Secondary | ICD-10-CM | POA: Diagnosis not present

## 2019-03-16 DIAGNOSIS — Z23 Encounter for immunization: Secondary | ICD-10-CM | POA: Diagnosis not present

## 2019-03-16 DIAGNOSIS — Z992 Dependence on renal dialysis: Secondary | ICD-10-CM | POA: Diagnosis not present

## 2019-03-16 DIAGNOSIS — M79671 Pain in right foot: Secondary | ICD-10-CM | POA: Diagnosis not present

## 2019-03-16 DIAGNOSIS — M25579 Pain in unspecified ankle and joints of unspecified foot: Secondary | ICD-10-CM | POA: Diagnosis not present

## 2019-03-18 DIAGNOSIS — E8779 Other fluid overload: Secondary | ICD-10-CM | POA: Diagnosis not present

## 2019-03-18 DIAGNOSIS — Z23 Encounter for immunization: Secondary | ICD-10-CM | POA: Diagnosis not present

## 2019-03-18 DIAGNOSIS — Z992 Dependence on renal dialysis: Secondary | ICD-10-CM | POA: Diagnosis not present

## 2019-03-18 DIAGNOSIS — N186 End stage renal disease: Secondary | ICD-10-CM | POA: Diagnosis not present

## 2019-03-21 DIAGNOSIS — E8779 Other fluid overload: Secondary | ICD-10-CM | POA: Diagnosis not present

## 2019-03-21 DIAGNOSIS — Z992 Dependence on renal dialysis: Secondary | ICD-10-CM | POA: Diagnosis not present

## 2019-03-21 DIAGNOSIS — N186 End stage renal disease: Secondary | ICD-10-CM | POA: Diagnosis not present

## 2019-03-21 DIAGNOSIS — Z23 Encounter for immunization: Secondary | ICD-10-CM | POA: Diagnosis not present

## 2019-03-22 DIAGNOSIS — D510 Vitamin B12 deficiency anemia due to intrinsic factor deficiency: Secondary | ICD-10-CM | POA: Diagnosis not present

## 2019-03-22 DIAGNOSIS — E1122 Type 2 diabetes mellitus with diabetic chronic kidney disease: Secondary | ICD-10-CM | POA: Diagnosis not present

## 2019-03-22 DIAGNOSIS — N186 End stage renal disease: Secondary | ICD-10-CM | POA: Diagnosis not present

## 2019-03-22 DIAGNOSIS — I10 Essential (primary) hypertension: Secondary | ICD-10-CM | POA: Diagnosis not present

## 2019-03-22 DIAGNOSIS — Z0000 Encounter for general adult medical examination without abnormal findings: Secondary | ICD-10-CM | POA: Diagnosis not present

## 2019-03-23 DIAGNOSIS — Z23 Encounter for immunization: Secondary | ICD-10-CM | POA: Diagnosis not present

## 2019-03-23 DIAGNOSIS — E8779 Other fluid overload: Secondary | ICD-10-CM | POA: Diagnosis not present

## 2019-03-23 DIAGNOSIS — Z992 Dependence on renal dialysis: Secondary | ICD-10-CM | POA: Diagnosis not present

## 2019-03-23 DIAGNOSIS — N186 End stage renal disease: Secondary | ICD-10-CM | POA: Diagnosis not present

## 2019-03-24 DIAGNOSIS — E8779 Other fluid overload: Secondary | ICD-10-CM | POA: Diagnosis not present

## 2019-03-24 DIAGNOSIS — N186 End stage renal disease: Secondary | ICD-10-CM | POA: Diagnosis not present

## 2019-03-24 DIAGNOSIS — Z992 Dependence on renal dialysis: Secondary | ICD-10-CM | POA: Diagnosis not present

## 2019-03-24 DIAGNOSIS — Z23 Encounter for immunization: Secondary | ICD-10-CM | POA: Diagnosis not present

## 2019-03-25 DIAGNOSIS — N186 End stage renal disease: Secondary | ICD-10-CM | POA: Diagnosis not present

## 2019-03-25 DIAGNOSIS — Z23 Encounter for immunization: Secondary | ICD-10-CM | POA: Diagnosis not present

## 2019-03-25 DIAGNOSIS — E8779 Other fluid overload: Secondary | ICD-10-CM | POA: Diagnosis not present

## 2019-03-25 DIAGNOSIS — Z992 Dependence on renal dialysis: Secondary | ICD-10-CM | POA: Diagnosis not present

## 2019-03-28 DIAGNOSIS — N186 End stage renal disease: Secondary | ICD-10-CM | POA: Diagnosis not present

## 2019-03-28 DIAGNOSIS — E8779 Other fluid overload: Secondary | ICD-10-CM | POA: Diagnosis not present

## 2019-03-28 DIAGNOSIS — Z23 Encounter for immunization: Secondary | ICD-10-CM | POA: Diagnosis not present

## 2019-03-28 DIAGNOSIS — Z992 Dependence on renal dialysis: Secondary | ICD-10-CM | POA: Diagnosis not present

## 2019-03-28 DIAGNOSIS — E119 Type 2 diabetes mellitus without complications: Secondary | ICD-10-CM | POA: Diagnosis not present

## 2019-03-28 DIAGNOSIS — D509 Iron deficiency anemia, unspecified: Secondary | ICD-10-CM | POA: Diagnosis not present

## 2019-03-28 DIAGNOSIS — N2581 Secondary hyperparathyroidism of renal origin: Secondary | ICD-10-CM | POA: Diagnosis not present

## 2019-03-30 DIAGNOSIS — N186 End stage renal disease: Secondary | ICD-10-CM | POA: Diagnosis not present

## 2019-03-30 DIAGNOSIS — Z992 Dependence on renal dialysis: Secondary | ICD-10-CM | POA: Diagnosis not present

## 2019-03-30 DIAGNOSIS — Z23 Encounter for immunization: Secondary | ICD-10-CM | POA: Diagnosis not present

## 2019-03-30 DIAGNOSIS — E8779 Other fluid overload: Secondary | ICD-10-CM | POA: Diagnosis not present

## 2019-04-01 DIAGNOSIS — E8779 Other fluid overload: Secondary | ICD-10-CM | POA: Diagnosis not present

## 2019-04-01 DIAGNOSIS — N186 End stage renal disease: Secondary | ICD-10-CM | POA: Diagnosis not present

## 2019-04-01 DIAGNOSIS — Z992 Dependence on renal dialysis: Secondary | ICD-10-CM | POA: Diagnosis not present

## 2019-04-01 DIAGNOSIS — Z23 Encounter for immunization: Secondary | ICD-10-CM | POA: Diagnosis not present

## 2019-04-04 DIAGNOSIS — Z992 Dependence on renal dialysis: Secondary | ICD-10-CM | POA: Diagnosis not present

## 2019-04-04 DIAGNOSIS — E8779 Other fluid overload: Secondary | ICD-10-CM | POA: Diagnosis not present

## 2019-04-04 DIAGNOSIS — N186 End stage renal disease: Secondary | ICD-10-CM | POA: Diagnosis not present

## 2019-04-04 DIAGNOSIS — Z23 Encounter for immunization: Secondary | ICD-10-CM | POA: Diagnosis not present

## 2019-04-06 DIAGNOSIS — N186 End stage renal disease: Secondary | ICD-10-CM | POA: Diagnosis not present

## 2019-04-06 DIAGNOSIS — Z992 Dependence on renal dialysis: Secondary | ICD-10-CM | POA: Diagnosis not present

## 2019-04-06 DIAGNOSIS — Z23 Encounter for immunization: Secondary | ICD-10-CM | POA: Diagnosis not present

## 2019-04-06 DIAGNOSIS — E8779 Other fluid overload: Secondary | ICD-10-CM | POA: Diagnosis not present

## 2019-04-07 DIAGNOSIS — M79671 Pain in right foot: Secondary | ICD-10-CM | POA: Diagnosis not present

## 2019-04-07 DIAGNOSIS — M25579 Pain in unspecified ankle and joints of unspecified foot: Secondary | ICD-10-CM | POA: Diagnosis not present

## 2019-04-08 DIAGNOSIS — E8779 Other fluid overload: Secondary | ICD-10-CM | POA: Diagnosis not present

## 2019-04-08 DIAGNOSIS — Z992 Dependence on renal dialysis: Secondary | ICD-10-CM | POA: Diagnosis not present

## 2019-04-08 DIAGNOSIS — Z23 Encounter for immunization: Secondary | ICD-10-CM | POA: Diagnosis not present

## 2019-04-08 DIAGNOSIS — N186 End stage renal disease: Secondary | ICD-10-CM | POA: Diagnosis not present

## 2019-04-11 DIAGNOSIS — N186 End stage renal disease: Secondary | ICD-10-CM | POA: Diagnosis not present

## 2019-04-11 DIAGNOSIS — Z992 Dependence on renal dialysis: Secondary | ICD-10-CM | POA: Diagnosis not present

## 2019-04-11 DIAGNOSIS — Z23 Encounter for immunization: Secondary | ICD-10-CM | POA: Diagnosis not present

## 2019-04-11 DIAGNOSIS — E8779 Other fluid overload: Secondary | ICD-10-CM | POA: Diagnosis not present

## 2019-04-13 DIAGNOSIS — N186 End stage renal disease: Secondary | ICD-10-CM | POA: Diagnosis not present

## 2019-04-13 DIAGNOSIS — E8779 Other fluid overload: Secondary | ICD-10-CM | POA: Diagnosis not present

## 2019-04-13 DIAGNOSIS — Z992 Dependence on renal dialysis: Secondary | ICD-10-CM | POA: Diagnosis not present

## 2019-04-13 DIAGNOSIS — Z23 Encounter for immunization: Secondary | ICD-10-CM | POA: Diagnosis not present

## 2019-04-15 DIAGNOSIS — N186 End stage renal disease: Secondary | ICD-10-CM | POA: Diagnosis not present

## 2019-04-15 DIAGNOSIS — E8779 Other fluid overload: Secondary | ICD-10-CM | POA: Diagnosis not present

## 2019-04-15 DIAGNOSIS — Z23 Encounter for immunization: Secondary | ICD-10-CM | POA: Diagnosis not present

## 2019-04-15 DIAGNOSIS — Z992 Dependence on renal dialysis: Secondary | ICD-10-CM | POA: Diagnosis not present

## 2019-04-16 DIAGNOSIS — Z992 Dependence on renal dialysis: Secondary | ICD-10-CM | POA: Diagnosis not present

## 2019-04-16 DIAGNOSIS — N186 End stage renal disease: Secondary | ICD-10-CM | POA: Diagnosis not present

## 2019-04-18 DIAGNOSIS — N2581 Secondary hyperparathyroidism of renal origin: Secondary | ICD-10-CM | POA: Diagnosis not present

## 2019-04-18 DIAGNOSIS — Z992 Dependence on renal dialysis: Secondary | ICD-10-CM | POA: Diagnosis not present

## 2019-04-18 DIAGNOSIS — N186 End stage renal disease: Secondary | ICD-10-CM | POA: Diagnosis not present

## 2019-04-20 DIAGNOSIS — N186 End stage renal disease: Secondary | ICD-10-CM | POA: Diagnosis not present

## 2019-04-20 DIAGNOSIS — Z992 Dependence on renal dialysis: Secondary | ICD-10-CM | POA: Diagnosis not present

## 2019-04-22 DIAGNOSIS — Z992 Dependence on renal dialysis: Secondary | ICD-10-CM | POA: Diagnosis not present

## 2019-04-22 DIAGNOSIS — N186 End stage renal disease: Secondary | ICD-10-CM | POA: Diagnosis not present

## 2019-04-25 DIAGNOSIS — Z992 Dependence on renal dialysis: Secondary | ICD-10-CM | POA: Diagnosis not present

## 2019-04-25 DIAGNOSIS — N186 End stage renal disease: Secondary | ICD-10-CM | POA: Diagnosis not present

## 2019-04-25 DIAGNOSIS — D509 Iron deficiency anemia, unspecified: Secondary | ICD-10-CM | POA: Diagnosis not present

## 2019-04-25 DIAGNOSIS — N2581 Secondary hyperparathyroidism of renal origin: Secondary | ICD-10-CM | POA: Diagnosis not present

## 2019-04-27 DIAGNOSIS — N186 End stage renal disease: Secondary | ICD-10-CM | POA: Diagnosis not present

## 2019-04-27 DIAGNOSIS — Z992 Dependence on renal dialysis: Secondary | ICD-10-CM | POA: Diagnosis not present

## 2019-04-28 DIAGNOSIS — M25579 Pain in unspecified ankle and joints of unspecified foot: Secondary | ICD-10-CM | POA: Diagnosis not present

## 2019-04-28 DIAGNOSIS — M7661 Achilles tendinitis, right leg: Secondary | ICD-10-CM | POA: Diagnosis not present

## 2019-04-28 DIAGNOSIS — M79671 Pain in right foot: Secondary | ICD-10-CM | POA: Diagnosis not present

## 2019-04-29 DIAGNOSIS — N186 End stage renal disease: Secondary | ICD-10-CM | POA: Diagnosis not present

## 2019-04-29 DIAGNOSIS — Z992 Dependence on renal dialysis: Secondary | ICD-10-CM | POA: Diagnosis not present

## 2019-05-02 DIAGNOSIS — Z992 Dependence on renal dialysis: Secondary | ICD-10-CM | POA: Diagnosis not present

## 2019-05-02 DIAGNOSIS — N186 End stage renal disease: Secondary | ICD-10-CM | POA: Diagnosis not present

## 2019-05-04 DIAGNOSIS — Z992 Dependence on renal dialysis: Secondary | ICD-10-CM | POA: Diagnosis not present

## 2019-05-04 DIAGNOSIS — N186 End stage renal disease: Secondary | ICD-10-CM | POA: Diagnosis not present

## 2019-05-06 DIAGNOSIS — Z992 Dependence on renal dialysis: Secondary | ICD-10-CM | POA: Diagnosis not present

## 2019-05-06 DIAGNOSIS — N186 End stage renal disease: Secondary | ICD-10-CM | POA: Diagnosis not present

## 2019-05-09 DIAGNOSIS — N186 End stage renal disease: Secondary | ICD-10-CM | POA: Diagnosis not present

## 2019-05-09 DIAGNOSIS — Z992 Dependence on renal dialysis: Secondary | ICD-10-CM | POA: Diagnosis not present

## 2019-05-09 DIAGNOSIS — N2581 Secondary hyperparathyroidism of renal origin: Secondary | ICD-10-CM | POA: Diagnosis not present

## 2019-05-11 DIAGNOSIS — N186 End stage renal disease: Secondary | ICD-10-CM | POA: Diagnosis not present

## 2019-05-11 DIAGNOSIS — Z992 Dependence on renal dialysis: Secondary | ICD-10-CM | POA: Diagnosis not present

## 2019-05-14 DIAGNOSIS — N186 End stage renal disease: Secondary | ICD-10-CM | POA: Diagnosis not present

## 2019-05-14 DIAGNOSIS — Z992 Dependence on renal dialysis: Secondary | ICD-10-CM | POA: Diagnosis not present

## 2019-05-15 ENCOUNTER — Encounter (HOSPITAL_COMMUNITY): Payer: Self-pay

## 2019-05-15 ENCOUNTER — Other Ambulatory Visit: Payer: Self-pay

## 2019-05-15 ENCOUNTER — Emergency Department (HOSPITAL_COMMUNITY)
Admission: EM | Admit: 2019-05-15 | Discharge: 2019-05-15 | Disposition: A | Discharge status: Home / Self Care | Payer: Medicare Other | Attending: Emergency Medicine | Admitting: Emergency Medicine

## 2019-05-15 DIAGNOSIS — Z794 Long term (current) use of insulin: Secondary | ICD-10-CM | POA: Diagnosis not present

## 2019-05-15 DIAGNOSIS — I120 Hypertensive chronic kidney disease with stage 5 chronic kidney disease or end stage renal disease: Secondary | ICD-10-CM | POA: Insufficient documentation

## 2019-05-15 DIAGNOSIS — Z8673 Personal history of transient ischemic attack (TIA), and cerebral infarction without residual deficits: Secondary | ICD-10-CM | POA: Insufficient documentation

## 2019-05-15 DIAGNOSIS — Z7982 Long term (current) use of aspirin: Secondary | ICD-10-CM | POA: Diagnosis not present

## 2019-05-15 DIAGNOSIS — E1165 Type 2 diabetes mellitus with hyperglycemia: Secondary | ICD-10-CM | POA: Insufficient documentation

## 2019-05-15 DIAGNOSIS — E1122 Type 2 diabetes mellitus with diabetic chronic kidney disease: Secondary | ICD-10-CM | POA: Diagnosis not present

## 2019-05-15 DIAGNOSIS — Z992 Dependence on renal dialysis: Secondary | ICD-10-CM | POA: Diagnosis not present

## 2019-05-15 DIAGNOSIS — N186 End stage renal disease: Secondary | ICD-10-CM | POA: Insufficient documentation

## 2019-05-15 DIAGNOSIS — I10 Essential (primary) hypertension: Secondary | ICD-10-CM | POA: Diagnosis not present

## 2019-05-15 DIAGNOSIS — Z79899 Other long term (current) drug therapy: Secondary | ICD-10-CM | POA: Diagnosis not present

## 2019-05-15 DIAGNOSIS — R6889 Other general symptoms and signs: Secondary | ICD-10-CM | POA: Diagnosis not present

## 2019-05-15 DIAGNOSIS — R739 Hyperglycemia, unspecified: Secondary | ICD-10-CM

## 2019-05-15 LAB — BASIC METABOLIC PANEL
Anion gap: 11 (ref 5–15)
BUN: 19 mg/dL (ref 8–23)
CO2: 27 mmol/L (ref 22–32)
Calcium: 7.9 mg/dL — ABNORMAL LOW (ref 8.9–10.3)
Chloride: 100 mmol/L (ref 98–111)
Creatinine, Ser: 7.03 mg/dL — ABNORMAL HIGH (ref 0.44–1.00)
GFR calc Af Amer: 6 mL/min — ABNORMAL LOW (ref >60)
GFR calc non Af Amer: 5 mL/min — ABNORMAL LOW (ref >60)
Glucose, Bld: 155 mg/dL — ABNORMAL HIGH (ref 70–99)
Potassium: 4.1 mmol/L (ref 3.5–5.1)
Sodium: 138 mmol/L (ref 135–145)

## 2019-05-15 LAB — CBC
HCT: 32.3 % — ABNORMAL LOW (ref 36.0–46.0)
Hemoglobin: 9.9 g/dL — ABNORMAL LOW (ref 12.0–15.0)
MCH: 31.1 pg (ref 26.0–34.0)
MCHC: 30.7 g/dL (ref 30.0–36.0)
MCV: 101.6 fL — ABNORMAL HIGH (ref 80.0–100.0)
Platelets: 171 10*3/uL (ref 150–400)
RBC: 3.18 MIL/uL — ABNORMAL LOW (ref 3.87–5.11)
RDW: 14 % (ref 11.5–15.5)
WBC: 8.3 10*3/uL (ref 4.0–10.5)
nRBC: 0 % (ref 0.0–0.2)

## 2019-05-15 LAB — CBG MONITORING, ED: Glucose-Capillary: 160 mg/dL — ABNORMAL HIGH (ref 70–99)

## 2019-05-15 NOTE — ED Provider Notes (Signed)
[facility] EMERGENCY DEPARTMENT Provider Note   CSN: [id number] Arrival date & time: 05/15/19  1533     History   Chief Complaint Chief Complaint  Patient presents with  . Hyperglycemia    HPI [name] is a 78 y.o. female.     HPI   78 year old female with hyperglycemia and "just does not feel good."  Patient apparently was "drenched in sweat" last night.  Her grandson checked her blood sugar was 324.  She began feeling better and went to bed.  She felt okay when she got up in the morning but then felt like she was having hot flashes again this afternoon.  Glucose was again in the 200s.  She states that she just does not feel well.  Per her daughter though that this has been an ongoing complaint for months to years.  She denies any acute pain.  She reports compliance with her medications.  She denies any significant change in her diet recently.  No cough.  No acute dyspnea.  End-stage renal disease.  Last dialysis was yesterday.  Past Medical History:  Diagnosis Date  . Anemia   . Cataract   . Chronic kidney disease   . CVA (cerebral infarction)   . Diabetes mellitus    Type 2  . Diabetes mellitus without complication ([place])   . Dialysis patient ([place])   . Dialysis patient ([place])    M, W, F  . Fistula    R arm  . GERD (gastroesophageal reflux disease)   . Hypertension   . Renal disorder   . Shortness of breath   . Stroke [facility])    right side weakness    Patient Active Problem List   Diagnosis Date Noted  . History of colonic polyps 04/01/2018  . Acute bronchitis 10/15/2017  . DM type 2, goal A1c below 7   . HCAP (healthcare-associated pneumonia) 05/29/2015  . Hyperkalemia 05/29/2015  . Diabetes mellitus with complication ([place])   . ESRD (end stage renal disease) on dialysis ([place])   . Encounter for adequacy testing for hemodialysis ([place]) 01/17/2014  . Mechanical complication of other vascular device, implant, and graft 01/17/2014  . Disturbances of  vision, late effect of stroke 11/03/2013  . Right hemiparesis ([place]) 08/19/2013  . CVA (cerebral infarction) 07/20/2013  . Gastroparesis due to DM ([place]) 07/01/2013  . End stage renal disease ([place]) 06/25/2013  . Anemia 01/26/2013  . DM W/NEURO MNFST, TYPE II, UNCONTROLLED 11/19/2006  . Leukocytosis 11/19/2006  . DEPENDENT EDEMA, LEGS, BILATERAL 11/19/2006  . SINUS TACHYCARDIA 10/16/2006  . Diabetes mellitus, type 2 ([place]) 05/20/2006  . HLD (hyperlipidemia) 05/20/2006  . SYNDROME, RESTLESS LEGS 05/20/2006  . PERIPHERAL NEUROPATHY 05/20/2006  . Hypertension 05/20/2006    Past Surgical History:  Procedure Laterality Date  . AV FISTULA PLACEMENT Right 09/08/2013   Procedure: CREATION OF RIGHT BRACHIAL CEPHALIC ARTERIOVENOUS FISTULA ;  Surgeon: Mal Misty, MD;  Location: [place];  Service: Vascular;  Laterality: Right;  . BASCILIC VEIN TRANSPOSITION Right 01/26/2014   Procedure: Right Arm BASILIC VEIN TRANSPOSITION;  Surgeon: Mal Misty, MD;  Location: [place];  Service: Vascular;  Laterality: Right;  . CATARACT EXTRACTION W/PHACO  11/20/2011   Procedure: CATARACT EXTRACTION PHACO AND INTRAOCULAR LENS PLACEMENT (IOC);  Surgeon: Tonny Branch, MD;  Location: [facility];  Service: Ophthalmology;  Laterality: Right;  CDE 18.82  . CATARACT EXTRACTION W/PHACO Left 11/18/2012   Procedure: CATARACT EXTRACTION PHACO AND INTRAOCULAR LENS PLACEMENT (IOC);  Surgeon: Levada Dy  Geoffry Paradise, MD;  Location: [facility];  Service: Ophthalmology;  Laterality: Left;  CDE: 18.97  . COLONOSCOPY N/A 02/09/2013   Procedure: COLONOSCOPY;  Surgeon: Rogene Houston, MD;  [facility];  Service: Endoscopy;  Laterality: N/A;  305-moved to 220 Ann to notify pt  . COLONOSCOPY N/A 07/08/2018   Procedure: COLONOSCOPY;  Surgeon: Rogene Houston, MD;  [facility];  Service: Endoscopy;  Laterality: N/A;  930  . INSERTION OF DIALYSIS CATHETER Right 06/24/2013   Procedure: INSERTION OF DIALYSIS CATHETER: Ultrasound guided;  Surgeon:  Serafina Mitchell, MD;  Location: [place];  Service: Vascular;  Laterality: Right;  . LIGATION OF ARTERIOVENOUS  FISTULA Right 01/26/2014   Procedure: LIGATION OF ARTERIOVENOUS  FISTULA;  Surgeon: Mal Misty, MD;  Location: [place];  Service: Vascular;  Laterality: Right;  . 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 LOOP RECORDER IMPLANT N/A 07/21/2013   Procedure: LOOP RECORDER IMPLANT;  Surgeon: Evans Lance, MD;  Location: [facility] CATH LAB;  Service: Cardiovascular;  Laterality: N/A;  . POLYPECTOMY  07/08/2018   Procedure: POLYPECTOMY;  Surgeon: Rogene Houston, MD;  [facility];  Service: Endoscopy;;  Descending colon polyps x 2   . TEE WITHOUT CARDIOVERSION N/A 07/21/2013   Procedure: TRANSESOPHAGEAL ECHOCARDIOGRAM (TEE);  Surgeon: Dorothy Spark, MD;  Location: [facility] ENDOSCOPY;  Service: Cardiovascular;  Laterality: N/A;     OB History    Gravida  0   Para  0   Term  0   Preterm  0   AB  0   Living        SAB  0   TAB  0   Ectopic  0   Multiple      Live Births               Home Medications    Prior to Admission medications   Medication Sig Start Date End Date Taking? Authorizing Provider  albuterol (PROVENTIL HFA;VENTOLIN HFA) 108 (90 Base) MCG/ACT inhaler Inhale 2 puffs into the lungs every 6 (six) hours as needed for wheezing or shortness of breath. 10/16/17   Kathie Dike, MD  aspirin 81 MG chewable tablet Chew 2 tablets (162 mg total) by mouth daily. 07/09/18   Rogene Houston, MD  BD INSULIN SYRINGE U/F 31G X 5/16" 0.3 ML MISC 2 (two) times daily. as directed 09/15/18   [provider]  cyanocobalamin (,VITAMIN B-12,) 1000 MCG/ML injection Inject 1 mL into the muscle every 30 (thirty) days. 11/01/14   [provider]  gabapentin (NEURONTIN) 300 MG capsule  10/29/18   [provider]  ibuprofen (ADVIL,MOTRIN) 200 MG tablet Take 400 mg by mouth every 6 (six) hours as needed for  headache or moderate pain.     [provider]  insulin NPH Human (HUMULIN N,NOVOLIN N) 100 UNIT/ML injection Inject 14 Units into the skin 2 (two) times daily before a meal.     [provider]  labetalol (NORMODYNE) 300 MG tablet Take 300 mg by mouth 2 (two) times daily. 11/06/14   [provider]  levETIRAcetam (KEPPRA) 250 MG tablet Take 250 mg by mouth 2 (two) times daily.     [provider]  lidocaine-prilocaine (EMLA) cream Apply 1 application topically every Monday, Wednesday, and Friday.  07/01/15   [provider]  multivitamin (RENA-VIT) TABS tablet Take 1 tablet by mouth at bedtime.  08/21/14   Kirsteins, Luanna Salk, MD  sevelamer carbonate (RENVELA) 800 MG tablet Take 800 mg by mouth 3 (three) times daily with meals.  12/24/17   [provider]    Family History Family History  Problem Relation Age of Onset  . Cancer Sister   . Cancer Brother   . Anesthesia problems Neg Hx   . Hypotension Neg Hx   . Malignant hyperthermia Neg Hx   . Pseudochol deficiency Neg Hx     Social History Social History   Tobacco Use  . Smoking status: Never Smoker  . Smokeless tobacco: Never Used  Substance Use Topics  . Alcohol use: No  . Drug use: No     Allergies   Ambien [zolpidem], Reglan [metoclopramide], and Reglan [metoclopramide]   Review of Systems Review of Systems  All systems reviewed and negative, other than as noted in HPI.  Physical Exam Updated Vital Signs BP (!) 180/75   Pulse 76   Temp 99.1 F (37.3 C) (Oral)   Resp 16   Ht 5\' 7"  (1.702 m)   Wt 82.6 kg   SpO2 100%   BMI 28.51 kg/m   Physical Exam Vitals signs and nursing note reviewed.  Constitutional:      General: She is not in acute distress.    Appearance: She is well-developed.  HENT:     Head: Normocephalic and atraumatic.  Eyes:     General:        Right eye: No discharge.        Left eye: No discharge.     Conjunctiva/sclera: Conjunctivae  normal.  Neck:     Musculoskeletal: Neck supple.  Cardiovascular:     Rate and Rhythm: Normal rate and regular rhythm.     Heart sounds: Normal heart sounds. No murmur. No friction rub. No gallop.      Comments: Fistula right arm with thrill. Pulmonary:     Effort: Pulmonary effort is normal. No respiratory distress.     Breath sounds: Normal breath sounds.  Abdominal:     General: There is no distension.     Palpations: Abdomen is soft.     Tenderness: There is no abdominal tenderness.  Musculoskeletal:        General: No tenderness.  Skin:    General: Skin is warm and dry.  Neurological:     Mental Status: She is alert and oriented to person, place, and time.     Cranial Nerves: No cranial nerve deficit.     Sensory: Sensory deficit present.     Motor: No weakness.  Psychiatric:        Behavior: Behavior normal.        Thought Content: Thought content normal.      ED Treatments / Results  Labs (all labs ordered are listed, but only abnormal results are displayed) Labs Reviewed  BASIC METABOLIC PANEL - Abnormal; Notable for the following components:      Result Value   Glucose, Bld 155 (*)    Creatinine, Ser 7.03 (*)    Calcium 7.9 (*)    GFR calc non Af Amer 5 (*)    GFR calc Af Amer 6 (*)    All other components within normal limits  CBC - Abnormal; Notable for the following components:   RBC 3.18 (*)    Hemoglobin 9.9 (*)    HCT 32.3 (*)    MCV 101.6 (*)    All other components within normal limits  CBG  MONITORING, ED - Abnormal; Notable for the following components:   Glucose-Capillary 160 (*)    All other components within normal limits  URINALYSIS, ROUTINE W REFLEX MICROSCOPIC    EKG None  Radiology No results found.  Procedures Procedures (including critical care time)  Medications Ordered in ED Medications - No data to display   Initial Impression / Assessment and Plan / ED Course  I have reviewed the triage vital signs and the nursing  notes.  Pertinent labs & imaging results that were available during my care of the patient were reviewed by me and considered in my medical decision making (see chart for details).      78 year old female with sweating last night, chills today and hyperglycemia.  Glucose currently is reasonable.  She is afebrile.  No leukocytosis.  Vague complaint not feeling well which seems more chronic in nature.  I am not clear as to exact etiology, but I doubt emergent process.  Anemia and renal function appear to be near her baseline.  EKG with no overt concerning findings.    Return precautions discussed.  Outpatient follow-up otherwise.  Final Clinical Impressions(s) / ED Diagnoses   Final diagnoses:  Hyperglycemia  Not feeling great    ED Discharge Orders    None       Virgel Manifold, MD 05/17/19 1100

## 2019-05-15 NOTE — ED Notes (Signed)
Pt with diaphoresis and elevated CBG at home last night, pt just states she just don't feel good.  Pt not very detailed other than she just don't feel good.  Daughter got off work to bring pt here for evaluation.  EDP in room at present

## 2019-05-15 NOTE — ED Triage Notes (Signed)
Pt presents to ED with complaints of hyperglycemia. Pt family states her glucose has been elevated since last night, unsure of number. Pt vague in triage but just states "I don't feel good, I've been sweating."

## 2019-05-16 DIAGNOSIS — Z992 Dependence on renal dialysis: Secondary | ICD-10-CM | POA: Diagnosis not present

## 2019-05-16 DIAGNOSIS — N186 End stage renal disease: Secondary | ICD-10-CM | POA: Diagnosis not present

## 2019-05-18 DIAGNOSIS — N186 End stage renal disease: Secondary | ICD-10-CM | POA: Diagnosis not present

## 2019-05-18 DIAGNOSIS — Z992 Dependence on renal dialysis: Secondary | ICD-10-CM | POA: Diagnosis not present

## 2019-05-20 DIAGNOSIS — Z992 Dependence on renal dialysis: Secondary | ICD-10-CM | POA: Diagnosis not present

## 2019-05-20 DIAGNOSIS — N186 End stage renal disease: Secondary | ICD-10-CM | POA: Diagnosis not present

## 2019-05-23 DIAGNOSIS — N186 End stage renal disease: Secondary | ICD-10-CM | POA: Diagnosis not present

## 2019-05-23 DIAGNOSIS — Z992 Dependence on renal dialysis: Secondary | ICD-10-CM | POA: Diagnosis not present

## 2019-05-24 DIAGNOSIS — Z992 Dependence on renal dialysis: Secondary | ICD-10-CM | POA: Diagnosis not present

## 2019-05-24 DIAGNOSIS — I871 Compression of vein: Secondary | ICD-10-CM | POA: Diagnosis not present

## 2019-05-24 DIAGNOSIS — N186 End stage renal disease: Secondary | ICD-10-CM | POA: Diagnosis not present

## 2019-05-24 DIAGNOSIS — R2232 Localized swelling, mass and lump, left upper limb: Secondary | ICD-10-CM | POA: Diagnosis not present

## 2019-05-25 DIAGNOSIS — Z992 Dependence on renal dialysis: Secondary | ICD-10-CM | POA: Diagnosis not present

## 2019-05-25 DIAGNOSIS — N186 End stage renal disease: Secondary | ICD-10-CM | POA: Diagnosis not present

## 2019-05-26 DIAGNOSIS — B351 Tinea unguium: Secondary | ICD-10-CM | POA: Diagnosis not present

## 2019-05-26 DIAGNOSIS — E1151 Type 2 diabetes mellitus with diabetic peripheral angiopathy without gangrene: Secondary | ICD-10-CM | POA: Diagnosis not present

## 2019-05-26 DIAGNOSIS — M79671 Pain in right foot: Secondary | ICD-10-CM | POA: Diagnosis not present

## 2019-05-26 DIAGNOSIS — E1140 Type 2 diabetes mellitus with diabetic neuropathy, unspecified: Secondary | ICD-10-CM | POA: Diagnosis not present

## 2019-05-26 DIAGNOSIS — L600 Ingrowing nail: Secondary | ICD-10-CM | POA: Diagnosis not present

## 2019-05-27 DIAGNOSIS — Z992 Dependence on renal dialysis: Secondary | ICD-10-CM | POA: Diagnosis not present

## 2019-05-27 DIAGNOSIS — N186 End stage renal disease: Secondary | ICD-10-CM | POA: Diagnosis not present

## 2019-05-30 DIAGNOSIS — N2581 Secondary hyperparathyroidism of renal origin: Secondary | ICD-10-CM | POA: Diagnosis not present

## 2019-05-30 DIAGNOSIS — N186 End stage renal disease: Secondary | ICD-10-CM | POA: Diagnosis not present

## 2019-05-30 DIAGNOSIS — D509 Iron deficiency anemia, unspecified: Secondary | ICD-10-CM | POA: Diagnosis not present

## 2019-05-30 DIAGNOSIS — Z992 Dependence on renal dialysis: Secondary | ICD-10-CM | POA: Diagnosis not present

## 2019-06-01 DIAGNOSIS — Z992 Dependence on renal dialysis: Secondary | ICD-10-CM | POA: Diagnosis not present

## 2019-06-01 DIAGNOSIS — N186 End stage renal disease: Secondary | ICD-10-CM | POA: Diagnosis not present

## 2019-06-02 DIAGNOSIS — M25579 Pain in unspecified ankle and joints of unspecified foot: Secondary | ICD-10-CM | POA: Diagnosis not present

## 2019-06-02 DIAGNOSIS — M79671 Pain in right foot: Secondary | ICD-10-CM | POA: Diagnosis not present

## 2019-06-02 DIAGNOSIS — M79672 Pain in left foot: Secondary | ICD-10-CM | POA: Diagnosis not present

## 2019-06-03 DIAGNOSIS — Z992 Dependence on renal dialysis: Secondary | ICD-10-CM | POA: Diagnosis not present

## 2019-06-03 DIAGNOSIS — N186 End stage renal disease: Secondary | ICD-10-CM | POA: Diagnosis not present

## 2019-06-06 DIAGNOSIS — Z992 Dependence on renal dialysis: Secondary | ICD-10-CM | POA: Diagnosis not present

## 2019-06-06 DIAGNOSIS — N186 End stage renal disease: Secondary | ICD-10-CM | POA: Diagnosis not present

## 2019-06-08 DIAGNOSIS — Z992 Dependence on renal dialysis: Secondary | ICD-10-CM | POA: Diagnosis not present

## 2019-06-08 DIAGNOSIS — N186 End stage renal disease: Secondary | ICD-10-CM | POA: Diagnosis not present

## 2019-06-11 DIAGNOSIS — Z992 Dependence on renal dialysis: Secondary | ICD-10-CM | POA: Diagnosis not present

## 2019-06-11 DIAGNOSIS — N186 End stage renal disease: Secondary | ICD-10-CM | POA: Diagnosis not present

## 2019-06-13 DIAGNOSIS — Z992 Dependence on renal dialysis: Secondary | ICD-10-CM | POA: Diagnosis not present

## 2019-06-13 DIAGNOSIS — N186 End stage renal disease: Secondary | ICD-10-CM | POA: Diagnosis not present

## 2019-06-15 DIAGNOSIS — Z992 Dependence on renal dialysis: Secondary | ICD-10-CM | POA: Diagnosis not present

## 2019-06-15 DIAGNOSIS — N186 End stage renal disease: Secondary | ICD-10-CM | POA: Diagnosis not present

## 2019-06-16 DIAGNOSIS — N186 End stage renal disease: Secondary | ICD-10-CM | POA: Diagnosis not present

## 2019-06-16 DIAGNOSIS — Z992 Dependence on renal dialysis: Secondary | ICD-10-CM | POA: Diagnosis not present

## 2019-06-17 DIAGNOSIS — N186 End stage renal disease: Secondary | ICD-10-CM | POA: Diagnosis not present

## 2019-06-17 DIAGNOSIS — Z992 Dependence on renal dialysis: Secondary | ICD-10-CM | POA: Diagnosis not present

## 2019-06-20 DIAGNOSIS — N186 End stage renal disease: Secondary | ICD-10-CM | POA: Diagnosis not present

## 2019-06-20 DIAGNOSIS — Z992 Dependence on renal dialysis: Secondary | ICD-10-CM | POA: Diagnosis not present

## 2019-06-22 DIAGNOSIS — Z992 Dependence on renal dialysis: Secondary | ICD-10-CM | POA: Diagnosis not present

## 2019-06-22 DIAGNOSIS — N186 End stage renal disease: Secondary | ICD-10-CM | POA: Diagnosis not present

## 2019-06-24 DIAGNOSIS — Z992 Dependence on renal dialysis: Secondary | ICD-10-CM | POA: Diagnosis not present

## 2019-06-24 DIAGNOSIS — N186 End stage renal disease: Secondary | ICD-10-CM | POA: Diagnosis not present

## 2019-06-27 DIAGNOSIS — E119 Type 2 diabetes mellitus without complications: Secondary | ICD-10-CM | POA: Diagnosis not present

## 2019-06-27 DIAGNOSIS — N2581 Secondary hyperparathyroidism of renal origin: Secondary | ICD-10-CM | POA: Diagnosis not present

## 2019-06-27 DIAGNOSIS — D509 Iron deficiency anemia, unspecified: Secondary | ICD-10-CM | POA: Diagnosis not present

## 2019-06-27 DIAGNOSIS — N186 End stage renal disease: Secondary | ICD-10-CM | POA: Diagnosis not present

## 2019-06-27 DIAGNOSIS — Z992 Dependence on renal dialysis: Secondary | ICD-10-CM | POA: Diagnosis not present

## 2019-06-28 DIAGNOSIS — E1122 Type 2 diabetes mellitus with diabetic chronic kidney disease: Secondary | ICD-10-CM | POA: Diagnosis not present

## 2019-06-28 DIAGNOSIS — N186 End stage renal disease: Secondary | ICD-10-CM | POA: Diagnosis not present

## 2019-06-28 DIAGNOSIS — I10 Essential (primary) hypertension: Secondary | ICD-10-CM | POA: Diagnosis not present

## 2019-06-28 DIAGNOSIS — D519 Vitamin B12 deficiency anemia, unspecified: Secondary | ICD-10-CM | POA: Diagnosis not present

## 2019-06-29 DIAGNOSIS — Z992 Dependence on renal dialysis: Secondary | ICD-10-CM | POA: Diagnosis not present

## 2019-06-29 DIAGNOSIS — N186 End stage renal disease: Secondary | ICD-10-CM | POA: Diagnosis not present

## 2019-06-30 DIAGNOSIS — M25579 Pain in unspecified ankle and joints of unspecified foot: Secondary | ICD-10-CM | POA: Diagnosis not present

## 2019-06-30 DIAGNOSIS — M79671 Pain in right foot: Secondary | ICD-10-CM | POA: Diagnosis not present

## 2019-06-30 DIAGNOSIS — M79672 Pain in left foot: Secondary | ICD-10-CM | POA: Diagnosis not present

## 2019-07-01 DIAGNOSIS — Z992 Dependence on renal dialysis: Secondary | ICD-10-CM | POA: Diagnosis not present

## 2019-07-01 DIAGNOSIS — N186 End stage renal disease: Secondary | ICD-10-CM | POA: Diagnosis not present

## 2019-07-04 DIAGNOSIS — Z992 Dependence on renal dialysis: Secondary | ICD-10-CM | POA: Diagnosis not present

## 2019-07-04 DIAGNOSIS — N186 End stage renal disease: Secondary | ICD-10-CM | POA: Diagnosis not present

## 2019-07-06 DIAGNOSIS — Z992 Dependence on renal dialysis: Secondary | ICD-10-CM | POA: Diagnosis not present

## 2019-07-06 DIAGNOSIS — N186 End stage renal disease: Secondary | ICD-10-CM | POA: Diagnosis not present

## 2019-07-07 ENCOUNTER — Other Ambulatory Visit (HOSPITAL_COMMUNITY): Payer: Self-pay | Admitting: Family Medicine

## 2019-07-07 ENCOUNTER — Other Ambulatory Visit: Payer: Self-pay | Admitting: Family Medicine

## 2019-07-07 DIAGNOSIS — R410 Disorientation, unspecified: Secondary | ICD-10-CM

## 2019-07-08 DIAGNOSIS — Z992 Dependence on renal dialysis: Secondary | ICD-10-CM | POA: Diagnosis not present

## 2019-07-08 DIAGNOSIS — N186 End stage renal disease: Secondary | ICD-10-CM | POA: Diagnosis not present

## 2019-07-11 DIAGNOSIS — Z992 Dependence on renal dialysis: Secondary | ICD-10-CM | POA: Diagnosis not present

## 2019-07-11 DIAGNOSIS — N186 End stage renal disease: Secondary | ICD-10-CM | POA: Diagnosis not present

## 2019-07-13 DIAGNOSIS — N186 End stage renal disease: Secondary | ICD-10-CM | POA: Diagnosis not present

## 2019-07-13 DIAGNOSIS — Z992 Dependence on renal dialysis: Secondary | ICD-10-CM | POA: Diagnosis not present

## 2019-07-15 DIAGNOSIS — Z992 Dependence on renal dialysis: Secondary | ICD-10-CM | POA: Diagnosis not present

## 2019-07-15 DIAGNOSIS — N186 End stage renal disease: Secondary | ICD-10-CM | POA: Diagnosis not present

## 2019-07-17 DIAGNOSIS — Z992 Dependence on renal dialysis: Secondary | ICD-10-CM | POA: Diagnosis not present

## 2019-07-17 DIAGNOSIS — N186 End stage renal disease: Secondary | ICD-10-CM | POA: Diagnosis not present

## 2019-07-18 DIAGNOSIS — Z992 Dependence on renal dialysis: Secondary | ICD-10-CM | POA: Diagnosis not present

## 2019-07-18 DIAGNOSIS — N186 End stage renal disease: Secondary | ICD-10-CM | POA: Diagnosis not present

## 2019-07-19 ENCOUNTER — Ambulatory Visit (HOSPITAL_COMMUNITY)
Admission: RE | Admit: 2019-07-19 | Discharge: 2019-07-19 | Disposition: A | Discharge status: Home / Self Care | Payer: Medicare Other | Source: Ambulatory Visit | Attending: Family Medicine | Admitting: Family Medicine

## 2019-07-19 ENCOUNTER — Other Ambulatory Visit: Payer: Self-pay

## 2019-07-19 DIAGNOSIS — R410 Disorientation, unspecified: Secondary | ICD-10-CM | POA: Diagnosis not present

## 2019-07-20 DIAGNOSIS — Z992 Dependence on renal dialysis: Secondary | ICD-10-CM | POA: Diagnosis not present

## 2019-07-20 DIAGNOSIS — N186 End stage renal disease: Secondary | ICD-10-CM | POA: Diagnosis not present

## 2019-07-22 DIAGNOSIS — N186 End stage renal disease: Secondary | ICD-10-CM | POA: Diagnosis not present

## 2019-07-22 DIAGNOSIS — Z992 Dependence on renal dialysis: Secondary | ICD-10-CM | POA: Diagnosis not present

## 2019-07-25 DIAGNOSIS — N186 End stage renal disease: Secondary | ICD-10-CM | POA: Diagnosis not present

## 2019-07-25 DIAGNOSIS — N2581 Secondary hyperparathyroidism of renal origin: Secondary | ICD-10-CM | POA: Diagnosis not present

## 2019-07-25 DIAGNOSIS — Z992 Dependence on renal dialysis: Secondary | ICD-10-CM | POA: Diagnosis not present

## 2019-07-27 DIAGNOSIS — D510 Vitamin B12 deficiency anemia due to intrinsic factor deficiency: Secondary | ICD-10-CM | POA: Diagnosis not present

## 2019-07-27 DIAGNOSIS — N186 End stage renal disease: Secondary | ICD-10-CM | POA: Diagnosis not present

## 2019-07-27 DIAGNOSIS — Z992 Dependence on renal dialysis: Secondary | ICD-10-CM | POA: Diagnosis not present

## 2019-07-28 DIAGNOSIS — M79671 Pain in right foot: Secondary | ICD-10-CM | POA: Diagnosis not present

## 2019-07-28 DIAGNOSIS — M79672 Pain in left foot: Secondary | ICD-10-CM | POA: Diagnosis not present

## 2019-07-28 DIAGNOSIS — M778 Other enthesopathies, not elsewhere classified: Secondary | ICD-10-CM | POA: Diagnosis not present

## 2019-07-29 DIAGNOSIS — N186 End stage renal disease: Secondary | ICD-10-CM | POA: Diagnosis not present

## 2019-07-29 DIAGNOSIS — Z992 Dependence on renal dialysis: Secondary | ICD-10-CM | POA: Diagnosis not present

## 2019-08-01 DIAGNOSIS — Z992 Dependence on renal dialysis: Secondary | ICD-10-CM | POA: Diagnosis not present

## 2019-08-01 DIAGNOSIS — N186 End stage renal disease: Secondary | ICD-10-CM | POA: Diagnosis not present

## 2019-08-03 DIAGNOSIS — N186 End stage renal disease: Secondary | ICD-10-CM | POA: Diagnosis not present

## 2019-08-03 DIAGNOSIS — Z992 Dependence on renal dialysis: Secondary | ICD-10-CM | POA: Diagnosis not present

## 2019-08-05 DIAGNOSIS — N186 End stage renal disease: Secondary | ICD-10-CM | POA: Diagnosis not present

## 2019-08-05 DIAGNOSIS — Z992 Dependence on renal dialysis: Secondary | ICD-10-CM | POA: Diagnosis not present

## 2019-08-08 DIAGNOSIS — N186 End stage renal disease: Secondary | ICD-10-CM | POA: Diagnosis not present

## 2019-08-08 DIAGNOSIS — Z992 Dependence on renal dialysis: Secondary | ICD-10-CM | POA: Diagnosis not present

## 2019-08-10 DIAGNOSIS — Z992 Dependence on renal dialysis: Secondary | ICD-10-CM | POA: Diagnosis not present

## 2019-08-10 DIAGNOSIS — N186 End stage renal disease: Secondary | ICD-10-CM | POA: Diagnosis not present

## 2019-08-12 DIAGNOSIS — N186 End stage renal disease: Secondary | ICD-10-CM | POA: Diagnosis not present

## 2019-08-12 DIAGNOSIS — Z992 Dependence on renal dialysis: Secondary | ICD-10-CM | POA: Diagnosis not present

## 2019-08-14 DIAGNOSIS — N186 End stage renal disease: Secondary | ICD-10-CM | POA: Diagnosis not present

## 2019-08-14 DIAGNOSIS — Z992 Dependence on renal dialysis: Secondary | ICD-10-CM | POA: Diagnosis not present

## 2019-08-15 DIAGNOSIS — N186 End stage renal disease: Secondary | ICD-10-CM | POA: Diagnosis not present

## 2019-08-15 DIAGNOSIS — Z992 Dependence on renal dialysis: Secondary | ICD-10-CM | POA: Diagnosis not present

## 2019-08-17 DIAGNOSIS — Z992 Dependence on renal dialysis: Secondary | ICD-10-CM | POA: Diagnosis not present

## 2019-08-17 DIAGNOSIS — N186 End stage renal disease: Secondary | ICD-10-CM | POA: Diagnosis not present

## 2019-08-18 DIAGNOSIS — B351 Tinea unguium: Secondary | ICD-10-CM | POA: Diagnosis not present

## 2019-08-18 DIAGNOSIS — E1151 Type 2 diabetes mellitus with diabetic peripheral angiopathy without gangrene: Secondary | ICD-10-CM | POA: Diagnosis not present

## 2019-08-18 DIAGNOSIS — E1140 Type 2 diabetes mellitus with diabetic neuropathy, unspecified: Secondary | ICD-10-CM | POA: Diagnosis not present

## 2019-08-18 DIAGNOSIS — L600 Ingrowing nail: Secondary | ICD-10-CM | POA: Diagnosis not present

## 2019-08-18 DIAGNOSIS — M79671 Pain in right foot: Secondary | ICD-10-CM | POA: Diagnosis not present

## 2019-08-19 DIAGNOSIS — Z992 Dependence on renal dialysis: Secondary | ICD-10-CM | POA: Diagnosis not present

## 2019-08-19 DIAGNOSIS — N186 End stage renal disease: Secondary | ICD-10-CM | POA: Diagnosis not present

## 2019-08-22 DIAGNOSIS — N186 End stage renal disease: Secondary | ICD-10-CM | POA: Diagnosis not present

## 2019-08-22 DIAGNOSIS — N2581 Secondary hyperparathyroidism of renal origin: Secondary | ICD-10-CM | POA: Diagnosis not present

## 2019-08-22 DIAGNOSIS — Z992 Dependence on renal dialysis: Secondary | ICD-10-CM | POA: Diagnosis not present

## 2019-08-24 DIAGNOSIS — N186 End stage renal disease: Secondary | ICD-10-CM | POA: Diagnosis not present

## 2019-08-24 DIAGNOSIS — Z992 Dependence on renal dialysis: Secondary | ICD-10-CM | POA: Diagnosis not present

## 2019-08-26 DIAGNOSIS — Z992 Dependence on renal dialysis: Secondary | ICD-10-CM | POA: Diagnosis not present

## 2019-08-26 DIAGNOSIS — N186 End stage renal disease: Secondary | ICD-10-CM | POA: Diagnosis not present

## 2019-08-29 DIAGNOSIS — Z992 Dependence on renal dialysis: Secondary | ICD-10-CM | POA: Diagnosis not present

## 2019-08-29 DIAGNOSIS — N186 End stage renal disease: Secondary | ICD-10-CM | POA: Diagnosis not present

## 2019-08-29 DIAGNOSIS — D509 Iron deficiency anemia, unspecified: Secondary | ICD-10-CM | POA: Diagnosis not present

## 2019-08-31 DIAGNOSIS — Z992 Dependence on renal dialysis: Secondary | ICD-10-CM | POA: Diagnosis not present

## 2019-08-31 DIAGNOSIS — N186 End stage renal disease: Secondary | ICD-10-CM | POA: Diagnosis not present

## 2019-09-02 DIAGNOSIS — N186 End stage renal disease: Secondary | ICD-10-CM | POA: Diagnosis not present

## 2019-09-02 DIAGNOSIS — Z992 Dependence on renal dialysis: Secondary | ICD-10-CM | POA: Diagnosis not present

## 2019-09-05 DIAGNOSIS — N186 End stage renal disease: Secondary | ICD-10-CM | POA: Diagnosis not present

## 2019-09-05 DIAGNOSIS — Z992 Dependence on renal dialysis: Secondary | ICD-10-CM | POA: Diagnosis not present

## 2019-09-07 DIAGNOSIS — Z992 Dependence on renal dialysis: Secondary | ICD-10-CM | POA: Diagnosis not present

## 2019-09-07 DIAGNOSIS — N186 End stage renal disease: Secondary | ICD-10-CM | POA: Diagnosis not present

## 2019-09-09 DIAGNOSIS — N186 End stage renal disease: Secondary | ICD-10-CM | POA: Diagnosis not present

## 2019-09-09 DIAGNOSIS — Z992 Dependence on renal dialysis: Secondary | ICD-10-CM | POA: Diagnosis not present

## 2019-09-12 DIAGNOSIS — N186 End stage renal disease: Secondary | ICD-10-CM | POA: Diagnosis not present

## 2019-09-12 DIAGNOSIS — Z992 Dependence on renal dialysis: Secondary | ICD-10-CM | POA: Diagnosis not present

## 2019-09-14 DIAGNOSIS — N186 End stage renal disease: Secondary | ICD-10-CM | POA: Diagnosis not present

## 2019-09-14 DIAGNOSIS — Z992 Dependence on renal dialysis: Secondary | ICD-10-CM | POA: Diagnosis not present

## 2019-09-16 DIAGNOSIS — Z992 Dependence on renal dialysis: Secondary | ICD-10-CM | POA: Diagnosis not present

## 2019-09-16 DIAGNOSIS — N186 End stage renal disease: Secondary | ICD-10-CM | POA: Diagnosis not present

## 2019-09-19 DIAGNOSIS — N186 End stage renal disease: Secondary | ICD-10-CM | POA: Diagnosis not present

## 2019-09-19 DIAGNOSIS — Z992 Dependence on renal dialysis: Secondary | ICD-10-CM | POA: Diagnosis not present

## 2019-09-21 DIAGNOSIS — Z992 Dependence on renal dialysis: Secondary | ICD-10-CM | POA: Diagnosis not present

## 2019-09-21 DIAGNOSIS — N186 End stage renal disease: Secondary | ICD-10-CM | POA: Diagnosis not present

## 2019-09-23 DIAGNOSIS — N186 End stage renal disease: Secondary | ICD-10-CM | POA: Diagnosis not present

## 2019-09-23 DIAGNOSIS — Z992 Dependence on renal dialysis: Secondary | ICD-10-CM | POA: Diagnosis not present

## 2019-09-26 DIAGNOSIS — Z992 Dependence on renal dialysis: Secondary | ICD-10-CM | POA: Diagnosis not present

## 2019-09-26 DIAGNOSIS — E119 Type 2 diabetes mellitus without complications: Secondary | ICD-10-CM | POA: Diagnosis not present

## 2019-09-26 DIAGNOSIS — N186 End stage renal disease: Secondary | ICD-10-CM | POA: Diagnosis not present

## 2019-09-26 DIAGNOSIS — N2581 Secondary hyperparathyroidism of renal origin: Secondary | ICD-10-CM | POA: Diagnosis not present

## 2019-09-26 DIAGNOSIS — D509 Iron deficiency anemia, unspecified: Secondary | ICD-10-CM | POA: Diagnosis not present

## 2019-09-27 DIAGNOSIS — E1122 Type 2 diabetes mellitus with diabetic chronic kidney disease: Secondary | ICD-10-CM | POA: Diagnosis not present

## 2019-09-27 DIAGNOSIS — I10 Essential (primary) hypertension: Secondary | ICD-10-CM | POA: Diagnosis not present

## 2019-09-27 DIAGNOSIS — N186 End stage renal disease: Secondary | ICD-10-CM | POA: Diagnosis not present

## 2019-09-27 DIAGNOSIS — J219 Acute bronchiolitis, unspecified: Secondary | ICD-10-CM | POA: Diagnosis not present

## 2019-09-27 DIAGNOSIS — E1165 Type 2 diabetes mellitus with hyperglycemia: Secondary | ICD-10-CM | POA: Diagnosis not present

## 2019-09-27 DIAGNOSIS — D509 Iron deficiency anemia, unspecified: Secondary | ICD-10-CM | POA: Diagnosis not present

## 2019-09-28 DIAGNOSIS — Z992 Dependence on renal dialysis: Secondary | ICD-10-CM | POA: Diagnosis not present

## 2019-09-28 DIAGNOSIS — N186 End stage renal disease: Secondary | ICD-10-CM | POA: Diagnosis not present

## 2019-09-30 DIAGNOSIS — N186 End stage renal disease: Secondary | ICD-10-CM | POA: Diagnosis not present

## 2019-09-30 DIAGNOSIS — Z992 Dependence on renal dialysis: Secondary | ICD-10-CM | POA: Diagnosis not present

## 2019-10-03 DIAGNOSIS — Z992 Dependence on renal dialysis: Secondary | ICD-10-CM | POA: Diagnosis not present

## 2019-10-03 DIAGNOSIS — N186 End stage renal disease: Secondary | ICD-10-CM | POA: Diagnosis not present

## 2019-10-05 DIAGNOSIS — N186 End stage renal disease: Secondary | ICD-10-CM | POA: Diagnosis not present

## 2019-10-05 DIAGNOSIS — Z992 Dependence on renal dialysis: Secondary | ICD-10-CM | POA: Diagnosis not present

## 2019-10-07 DIAGNOSIS — Z992 Dependence on renal dialysis: Secondary | ICD-10-CM | POA: Diagnosis not present

## 2019-10-07 DIAGNOSIS — N186 End stage renal disease: Secondary | ICD-10-CM | POA: Diagnosis not present

## 2019-10-10 DIAGNOSIS — Z992 Dependence on renal dialysis: Secondary | ICD-10-CM | POA: Diagnosis not present

## 2019-10-10 DIAGNOSIS — N186 End stage renal disease: Secondary | ICD-10-CM | POA: Diagnosis not present

## 2019-10-10 DIAGNOSIS — N2581 Secondary hyperparathyroidism of renal origin: Secondary | ICD-10-CM | POA: Diagnosis not present

## 2019-10-12 DIAGNOSIS — N186 End stage renal disease: Secondary | ICD-10-CM | POA: Diagnosis not present

## 2019-10-12 DIAGNOSIS — Z992 Dependence on renal dialysis: Secondary | ICD-10-CM | POA: Diagnosis not present

## 2019-10-13 ENCOUNTER — Ambulatory Visit (INDEPENDENT_AMBULATORY_CARE_PROVIDER_SITE_OTHER): Payer: Medicare Other

## 2019-10-13 ENCOUNTER — Ambulatory Visit: Payer: Medicare Other | Admitting: Podiatry

## 2019-10-13 ENCOUNTER — Other Ambulatory Visit: Payer: Self-pay | Admitting: Podiatry

## 2019-10-13 ENCOUNTER — Encounter: Payer: Self-pay | Admitting: Podiatry

## 2019-10-13 ENCOUNTER — Other Ambulatory Visit: Payer: Self-pay

## 2019-10-13 DIAGNOSIS — M778 Other enthesopathies, not elsewhere classified: Secondary | ICD-10-CM | POA: Diagnosis not present

## 2019-10-13 DIAGNOSIS — M7661 Achilles tendinitis, right leg: Secondary | ICD-10-CM

## 2019-10-13 DIAGNOSIS — L84 Corns and callosities: Secondary | ICD-10-CM | POA: Diagnosis not present

## 2019-10-13 DIAGNOSIS — M7662 Achilles tendinitis, left leg: Secondary | ICD-10-CM

## 2019-10-13 DIAGNOSIS — M79671 Pain in right foot: Secondary | ICD-10-CM

## 2019-10-13 DIAGNOSIS — M79672 Pain in left foot: Secondary | ICD-10-CM

## 2019-10-13 DIAGNOSIS — M779 Enthesopathy, unspecified: Secondary | ICD-10-CM

## 2019-10-13 NOTE — Progress Notes (Signed)
Subjective:   Patient ID: [name], female   DOB: 79 y.o.   MRN: [id number]   HPI Patient presents stating has had a lot of pain on the side of my right foot with inflammation fluid buildup and it is making it hard for me to be active and I also can get some pain in my other foot at times.  Patient does not smoke currently and is not active due to pain   Review of Systems  All other systems reviewed and are negative.       Objective:  Physical Exam Vitals and nursing note reviewed.  Constitutional:      Appearance: She is well-developed.  Pulmonary:     Effort: Pulmonary effort is normal.  Musculoskeletal:        General: Normal range of motion.  Skin:    General: Skin is warm.  Neurological:     Mental Status: She is alert.     Neurovascular status was found to be intact with slight diminishment of pulses on the left foot.  Patient has quite a bit of inflammation around the first MPJ right with fluid buildup of the joint surface with keratotic lesion formation that is painful when pressed and making shoe gear difficult.  Patient does have some discomfort in the posterior heel region right and is unable to give me a good guidance at this with mild pain in the left.     Assessment:  Plantar capsule inflammation right with fluid buildup keratotic lesion formation right with pain and also discomfort posterior heel region bilateral     Plan:  H&P all conditions reviewed and discussed Achilles tendinitis capsulitis and keratotic lesion formation and I went ahead and did a plantar capsular injection first MPJ right 3 mg Dexasone Kenalog 5 mg Xylocaine and then went ahead debrided lesion and dispensed a cushion for the posterior heel region.  If symptoms continue will be seen back  X-rays indicate posterior spur no indications of stress fracture arthritis

## 2019-10-14 DIAGNOSIS — I120 Hypertensive chronic kidney disease with stage 5 chronic kidney disease or end stage renal disease: Secondary | ICD-10-CM | POA: Diagnosis not present

## 2019-10-14 DIAGNOSIS — N186 End stage renal disease: Secondary | ICD-10-CM | POA: Diagnosis not present

## 2019-10-14 DIAGNOSIS — Z992 Dependence on renal dialysis: Secondary | ICD-10-CM | POA: Diagnosis not present

## 2019-10-14 DIAGNOSIS — E1122 Type 2 diabetes mellitus with diabetic chronic kidney disease: Secondary | ICD-10-CM | POA: Diagnosis not present

## 2019-10-14 DIAGNOSIS — D519 Vitamin B12 deficiency anemia, unspecified: Secondary | ICD-10-CM | POA: Diagnosis not present

## 2019-10-17 DIAGNOSIS — Z992 Dependence on renal dialysis: Secondary | ICD-10-CM | POA: Diagnosis not present

## 2019-10-17 DIAGNOSIS — N186 End stage renal disease: Secondary | ICD-10-CM | POA: Diagnosis not present

## 2019-10-19 DIAGNOSIS — Z992 Dependence on renal dialysis: Secondary | ICD-10-CM | POA: Diagnosis not present

## 2019-10-19 DIAGNOSIS — N186 End stage renal disease: Secondary | ICD-10-CM | POA: Diagnosis not present

## 2019-10-21 DIAGNOSIS — N186 End stage renal disease: Secondary | ICD-10-CM | POA: Diagnosis not present

## 2019-10-21 DIAGNOSIS — Z992 Dependence on renal dialysis: Secondary | ICD-10-CM | POA: Diagnosis not present

## 2019-10-24 DIAGNOSIS — N186 End stage renal disease: Secondary | ICD-10-CM | POA: Diagnosis not present

## 2019-10-24 DIAGNOSIS — Z992 Dependence on renal dialysis: Secondary | ICD-10-CM | POA: Diagnosis not present

## 2019-10-26 DIAGNOSIS — Z992 Dependence on renal dialysis: Secondary | ICD-10-CM | POA: Diagnosis not present

## 2019-10-26 DIAGNOSIS — N186 End stage renal disease: Secondary | ICD-10-CM | POA: Diagnosis not present

## 2019-10-28 DIAGNOSIS — Z992 Dependence on renal dialysis: Secondary | ICD-10-CM | POA: Diagnosis not present

## 2019-10-28 DIAGNOSIS — N186 End stage renal disease: Secondary | ICD-10-CM | POA: Diagnosis not present

## 2019-10-31 DIAGNOSIS — N2581 Secondary hyperparathyroidism of renal origin: Secondary | ICD-10-CM | POA: Diagnosis not present

## 2019-10-31 DIAGNOSIS — Z992 Dependence on renal dialysis: Secondary | ICD-10-CM | POA: Diagnosis not present

## 2019-10-31 DIAGNOSIS — N186 End stage renal disease: Secondary | ICD-10-CM | POA: Diagnosis not present

## 2019-11-02 DIAGNOSIS — N186 End stage renal disease: Secondary | ICD-10-CM | POA: Diagnosis not present

## 2019-11-02 DIAGNOSIS — Z992 Dependence on renal dialysis: Secondary | ICD-10-CM | POA: Diagnosis not present

## 2019-11-04 DIAGNOSIS — N186 End stage renal disease: Secondary | ICD-10-CM | POA: Diagnosis not present

## 2019-11-04 DIAGNOSIS — Z992 Dependence on renal dialysis: Secondary | ICD-10-CM | POA: Diagnosis not present

## 2019-11-07 DIAGNOSIS — N186 End stage renal disease: Secondary | ICD-10-CM | POA: Diagnosis not present

## 2019-11-07 DIAGNOSIS — D510 Vitamin B12 deficiency anemia due to intrinsic factor deficiency: Secondary | ICD-10-CM | POA: Diagnosis not present

## 2019-11-07 DIAGNOSIS — Z992 Dependence on renal dialysis: Secondary | ICD-10-CM | POA: Diagnosis not present

## 2019-11-09 DIAGNOSIS — Z992 Dependence on renal dialysis: Secondary | ICD-10-CM | POA: Diagnosis not present

## 2019-11-09 DIAGNOSIS — N186 End stage renal disease: Secondary | ICD-10-CM | POA: Diagnosis not present

## 2019-11-11 DIAGNOSIS — N186 End stage renal disease: Secondary | ICD-10-CM | POA: Diagnosis not present

## 2019-11-11 DIAGNOSIS — Z992 Dependence on renal dialysis: Secondary | ICD-10-CM | POA: Diagnosis not present

## 2019-11-14 DIAGNOSIS — I120 Hypertensive chronic kidney disease with stage 5 chronic kidney disease or end stage renal disease: Secondary | ICD-10-CM | POA: Diagnosis not present

## 2019-11-14 DIAGNOSIS — D519 Vitamin B12 deficiency anemia, unspecified: Secondary | ICD-10-CM | POA: Diagnosis not present

## 2019-11-14 DIAGNOSIS — N186 End stage renal disease: Secondary | ICD-10-CM | POA: Diagnosis not present

## 2019-11-14 DIAGNOSIS — E1122 Type 2 diabetes mellitus with diabetic chronic kidney disease: Secondary | ICD-10-CM | POA: Diagnosis not present

## 2019-11-14 DIAGNOSIS — Z992 Dependence on renal dialysis: Secondary | ICD-10-CM | POA: Diagnosis not present

## 2019-11-16 DIAGNOSIS — Z992 Dependence on renal dialysis: Secondary | ICD-10-CM | POA: Diagnosis not present

## 2019-11-16 DIAGNOSIS — N186 End stage renal disease: Secondary | ICD-10-CM | POA: Diagnosis not present

## 2019-11-17 DIAGNOSIS — B351 Tinea unguium: Secondary | ICD-10-CM | POA: Diagnosis not present

## 2019-11-17 DIAGNOSIS — E1140 Type 2 diabetes mellitus with diabetic neuropathy, unspecified: Secondary | ICD-10-CM | POA: Diagnosis not present

## 2019-11-17 DIAGNOSIS — L600 Ingrowing nail: Secondary | ICD-10-CM | POA: Diagnosis not present

## 2019-11-17 DIAGNOSIS — E1151 Type 2 diabetes mellitus with diabetic peripheral angiopathy without gangrene: Secondary | ICD-10-CM | POA: Diagnosis not present

## 2019-11-17 DIAGNOSIS — M79671 Pain in right foot: Secondary | ICD-10-CM | POA: Diagnosis not present

## 2019-11-18 DIAGNOSIS — Z992 Dependence on renal dialysis: Secondary | ICD-10-CM | POA: Diagnosis not present

## 2019-11-18 DIAGNOSIS — N186 End stage renal disease: Secondary | ICD-10-CM | POA: Diagnosis not present

## 2019-11-21 DIAGNOSIS — Z992 Dependence on renal dialysis: Secondary | ICD-10-CM | POA: Diagnosis not present

## 2019-11-21 DIAGNOSIS — N186 End stage renal disease: Secondary | ICD-10-CM | POA: Diagnosis not present

## 2019-11-23 DIAGNOSIS — N186 End stage renal disease: Secondary | ICD-10-CM | POA: Diagnosis not present

## 2019-11-23 DIAGNOSIS — Z992 Dependence on renal dialysis: Secondary | ICD-10-CM | POA: Diagnosis not present

## 2019-11-25 DIAGNOSIS — N186 End stage renal disease: Secondary | ICD-10-CM | POA: Diagnosis not present

## 2019-11-25 DIAGNOSIS — Z992 Dependence on renal dialysis: Secondary | ICD-10-CM | POA: Diagnosis not present

## 2019-11-28 DIAGNOSIS — N186 End stage renal disease: Secondary | ICD-10-CM | POA: Diagnosis not present

## 2019-11-28 DIAGNOSIS — Z992 Dependence on renal dialysis: Secondary | ICD-10-CM | POA: Diagnosis not present

## 2019-11-28 DIAGNOSIS — N2581 Secondary hyperparathyroidism of renal origin: Secondary | ICD-10-CM | POA: Diagnosis not present

## 2019-11-29 DIAGNOSIS — M25579 Pain in unspecified ankle and joints of unspecified foot: Secondary | ICD-10-CM | POA: Diagnosis not present

## 2019-11-29 DIAGNOSIS — M79671 Pain in right foot: Secondary | ICD-10-CM | POA: Diagnosis not present

## 2019-11-29 DIAGNOSIS — M67873 Other specified disorders of tendon, right ankle and foot: Secondary | ICD-10-CM | POA: Diagnosis not present

## 2019-11-30 DIAGNOSIS — Z992 Dependence on renal dialysis: Secondary | ICD-10-CM | POA: Diagnosis not present

## 2019-11-30 DIAGNOSIS — N186 End stage renal disease: Secondary | ICD-10-CM | POA: Diagnosis not present

## 2019-12-02 DIAGNOSIS — N186 End stage renal disease: Secondary | ICD-10-CM | POA: Diagnosis not present

## 2019-12-02 DIAGNOSIS — Z992 Dependence on renal dialysis: Secondary | ICD-10-CM | POA: Diagnosis not present

## 2019-12-05 DIAGNOSIS — N186 End stage renal disease: Secondary | ICD-10-CM | POA: Diagnosis not present

## 2019-12-05 DIAGNOSIS — Z992 Dependence on renal dialysis: Secondary | ICD-10-CM | POA: Diagnosis not present

## 2019-12-07 DIAGNOSIS — N186 End stage renal disease: Secondary | ICD-10-CM | POA: Diagnosis not present

## 2019-12-07 DIAGNOSIS — Z992 Dependence on renal dialysis: Secondary | ICD-10-CM | POA: Diagnosis not present

## 2019-12-09 DIAGNOSIS — N186 End stage renal disease: Secondary | ICD-10-CM | POA: Diagnosis not present

## 2019-12-09 DIAGNOSIS — Z992 Dependence on renal dialysis: Secondary | ICD-10-CM | POA: Diagnosis not present

## 2019-12-12 DIAGNOSIS — N186 End stage renal disease: Secondary | ICD-10-CM | POA: Diagnosis not present

## 2019-12-12 DIAGNOSIS — Z992 Dependence on renal dialysis: Secondary | ICD-10-CM | POA: Diagnosis not present

## 2019-12-12 DIAGNOSIS — D510 Vitamin B12 deficiency anemia due to intrinsic factor deficiency: Secondary | ICD-10-CM | POA: Diagnosis not present

## 2019-12-14 DIAGNOSIS — E1122 Type 2 diabetes mellitus with diabetic chronic kidney disease: Secondary | ICD-10-CM | POA: Diagnosis not present

## 2019-12-14 DIAGNOSIS — N186 End stage renal disease: Secondary | ICD-10-CM | POA: Diagnosis not present

## 2019-12-14 DIAGNOSIS — I120 Hypertensive chronic kidney disease with stage 5 chronic kidney disease or end stage renal disease: Secondary | ICD-10-CM | POA: Diagnosis not present

## 2019-12-14 DIAGNOSIS — Z992 Dependence on renal dialysis: Secondary | ICD-10-CM | POA: Diagnosis not present

## 2019-12-14 DIAGNOSIS — E519 Thiamine deficiency, unspecified: Secondary | ICD-10-CM | POA: Diagnosis not present

## 2019-12-16 DIAGNOSIS — Z992 Dependence on renal dialysis: Secondary | ICD-10-CM | POA: Diagnosis not present

## 2019-12-16 DIAGNOSIS — N186 End stage renal disease: Secondary | ICD-10-CM | POA: Diagnosis not present

## 2019-12-19 DIAGNOSIS — Z992 Dependence on renal dialysis: Secondary | ICD-10-CM | POA: Diagnosis not present

## 2019-12-19 DIAGNOSIS — N186 End stage renal disease: Secondary | ICD-10-CM | POA: Diagnosis not present

## 2019-12-21 DIAGNOSIS — N186 End stage renal disease: Secondary | ICD-10-CM | POA: Diagnosis not present

## 2019-12-21 DIAGNOSIS — Z992 Dependence on renal dialysis: Secondary | ICD-10-CM | POA: Diagnosis not present

## 2019-12-23 DIAGNOSIS — N186 End stage renal disease: Secondary | ICD-10-CM | POA: Diagnosis not present

## 2019-12-23 DIAGNOSIS — Z992 Dependence on renal dialysis: Secondary | ICD-10-CM | POA: Diagnosis not present

## 2019-12-26 DIAGNOSIS — Z992 Dependence on renal dialysis: Secondary | ICD-10-CM | POA: Diagnosis not present

## 2019-12-26 DIAGNOSIS — N186 End stage renal disease: Secondary | ICD-10-CM | POA: Diagnosis not present

## 2019-12-26 DIAGNOSIS — D509 Iron deficiency anemia, unspecified: Secondary | ICD-10-CM | POA: Diagnosis not present

## 2019-12-26 DIAGNOSIS — E119 Type 2 diabetes mellitus without complications: Secondary | ICD-10-CM | POA: Diagnosis not present

## 2019-12-26 DIAGNOSIS — N2581 Secondary hyperparathyroidism of renal origin: Secondary | ICD-10-CM | POA: Diagnosis not present

## 2019-12-28 DIAGNOSIS — Z992 Dependence on renal dialysis: Secondary | ICD-10-CM | POA: Diagnosis not present

## 2019-12-28 DIAGNOSIS — N186 End stage renal disease: Secondary | ICD-10-CM | POA: Diagnosis not present

## 2019-12-30 DIAGNOSIS — N186 End stage renal disease: Secondary | ICD-10-CM | POA: Diagnosis not present

## 2019-12-30 DIAGNOSIS — Z992 Dependence on renal dialysis: Secondary | ICD-10-CM | POA: Diagnosis not present

## 2020-01-02 DIAGNOSIS — N186 End stage renal disease: Secondary | ICD-10-CM | POA: Diagnosis not present

## 2020-01-02 DIAGNOSIS — Z992 Dependence on renal dialysis: Secondary | ICD-10-CM | POA: Diagnosis not present

## 2020-01-04 DIAGNOSIS — N186 End stage renal disease: Secondary | ICD-10-CM | POA: Diagnosis not present

## 2020-01-04 DIAGNOSIS — Z992 Dependence on renal dialysis: Secondary | ICD-10-CM | POA: Diagnosis not present

## 2020-01-05 ENCOUNTER — Encounter (HOSPITAL_COMMUNITY): Payer: Self-pay

## 2020-01-05 ENCOUNTER — Emergency Department (HOSPITAL_COMMUNITY): Payer: Medicare Other

## 2020-01-05 ENCOUNTER — Other Ambulatory Visit: Payer: Self-pay

## 2020-01-05 ENCOUNTER — Inpatient Hospital Stay (HOSPITAL_COMMUNITY)
Admission: EM | Admit: 2020-01-05 | Discharge: 2020-01-17 | DRG: 853 | Disposition: A | Discharge status: Home Health | Payer: Medicare Other | Source: Ambulatory Visit | Attending: Internal Medicine | Admitting: Internal Medicine

## 2020-01-05 DIAGNOSIS — D6959 Other secondary thrombocytopenia: Secondary | ICD-10-CM | POA: Diagnosis not present

## 2020-01-05 DIAGNOSIS — G92 Toxic encephalopathy: Secondary | ICD-10-CM | POA: Diagnosis not present

## 2020-01-05 DIAGNOSIS — K219 Gastro-esophageal reflux disease without esophagitis: Secondary | ICD-10-CM | POA: Diagnosis present

## 2020-01-05 DIAGNOSIS — I152 Hypertension secondary to endocrine disorders: Secondary | ICD-10-CM | POA: Diagnosis present

## 2020-01-05 DIAGNOSIS — L0390 Cellulitis, unspecified: Secondary | ICD-10-CM

## 2020-01-05 DIAGNOSIS — I69351 Hemiplegia and hemiparesis following cerebral infarction affecting right dominant side: Secondary | ICD-10-CM

## 2020-01-05 DIAGNOSIS — E1122 Type 2 diabetes mellitus with diabetic chronic kidney disease: Secondary | ICD-10-CM | POA: Diagnosis not present

## 2020-01-05 DIAGNOSIS — Z794 Long term (current) use of insulin: Secondary | ICD-10-CM | POA: Diagnosis not present

## 2020-01-05 DIAGNOSIS — N186 End stage renal disease: Secondary | ICD-10-CM | POA: Diagnosis not present

## 2020-01-05 DIAGNOSIS — E875 Hyperkalemia: Secondary | ICD-10-CM | POA: Diagnosis not present

## 2020-01-05 DIAGNOSIS — I120 Hypertensive chronic kidney disease with stage 5 chronic kidney disease or end stage renal disease: Secondary | ICD-10-CM | POA: Diagnosis present

## 2020-01-05 DIAGNOSIS — N2581 Secondary hyperparathyroidism of renal origin: Secondary | ICD-10-CM | POA: Diagnosis present

## 2020-01-05 DIAGNOSIS — E1140 Type 2 diabetes mellitus with diabetic neuropathy, unspecified: Secondary | ICD-10-CM | POA: Diagnosis present

## 2020-01-05 DIAGNOSIS — E1159 Type 2 diabetes mellitus with other circulatory complications: Secondary | ICD-10-CM

## 2020-01-05 DIAGNOSIS — E11621 Type 2 diabetes mellitus with foot ulcer: Secondary | ICD-10-CM | POA: Diagnosis not present

## 2020-01-05 DIAGNOSIS — D631 Anemia in chronic kidney disease: Secondary | ICD-10-CM | POA: Diagnosis present

## 2020-01-05 DIAGNOSIS — L02611 Cutaneous abscess of right foot: Secondary | ICD-10-CM | POA: Diagnosis not present

## 2020-01-05 DIAGNOSIS — R918 Other nonspecific abnormal finding of lung field: Secondary | ICD-10-CM | POA: Diagnosis not present

## 2020-01-05 DIAGNOSIS — L97519 Non-pressure chronic ulcer of other part of right foot with unspecified severity: Secondary | ICD-10-CM | POA: Diagnosis not present

## 2020-01-05 DIAGNOSIS — E1152 Type 2 diabetes mellitus with diabetic peripheral angiopathy with gangrene: Secondary | ICD-10-CM | POA: Diagnosis not present

## 2020-01-05 DIAGNOSIS — D72829 Elevated white blood cell count, unspecified: Secondary | ICD-10-CM | POA: Diagnosis present

## 2020-01-05 DIAGNOSIS — Z79899 Other long term (current) drug therapy: Secondary | ICD-10-CM | POA: Diagnosis not present

## 2020-01-05 DIAGNOSIS — M79671 Pain in right foot: Secondary | ICD-10-CM | POA: Diagnosis not present

## 2020-01-05 DIAGNOSIS — R569 Unspecified convulsions: Secondary | ICD-10-CM | POA: Diagnosis not present

## 2020-01-05 DIAGNOSIS — E538 Deficiency of other specified B group vitamins: Secondary | ICD-10-CM | POA: Diagnosis present

## 2020-01-05 DIAGNOSIS — Z8673 Personal history of transient ischemic attack (TIA), and cerebral infarction without residual deficits: Secondary | ICD-10-CM | POA: Diagnosis not present

## 2020-01-05 DIAGNOSIS — Z20822 Contact with and (suspected) exposure to covid-19: Secondary | ICD-10-CM | POA: Diagnosis present

## 2020-01-05 DIAGNOSIS — E08621 Diabetes mellitus due to underlying condition with foot ulcer: Secondary | ICD-10-CM

## 2020-01-05 DIAGNOSIS — E11649 Type 2 diabetes mellitus with hypoglycemia without coma: Secondary | ICD-10-CM | POA: Diagnosis not present

## 2020-01-05 DIAGNOSIS — D696 Thrombocytopenia, unspecified: Secondary | ICD-10-CM

## 2020-01-05 DIAGNOSIS — L03115 Cellulitis of right lower limb: Secondary | ICD-10-CM | POA: Diagnosis not present

## 2020-01-05 DIAGNOSIS — I10 Essential (primary) hypertension: Secondary | ICD-10-CM

## 2020-01-05 DIAGNOSIS — A419 Sepsis, unspecified organism: Secondary | ICD-10-CM | POA: Diagnosis not present

## 2020-01-05 DIAGNOSIS — Z992 Dependence on renal dialysis: Secondary | ICD-10-CM | POA: Diagnosis not present

## 2020-01-05 DIAGNOSIS — I96 Gangrene, not elsewhere classified: Secondary | ICD-10-CM | POA: Diagnosis not present

## 2020-01-05 DIAGNOSIS — M79672 Pain in left foot: Secondary | ICD-10-CM | POA: Diagnosis not present

## 2020-01-05 DIAGNOSIS — Z7982 Long term (current) use of aspirin: Secondary | ICD-10-CM

## 2020-01-05 DIAGNOSIS — L89892 Pressure ulcer of other site, stage 2: Secondary | ICD-10-CM | POA: Diagnosis not present

## 2020-01-05 DIAGNOSIS — E871 Hypo-osmolality and hyponatremia: Secondary | ICD-10-CM | POA: Diagnosis not present

## 2020-01-05 DIAGNOSIS — M7989 Other specified soft tissue disorders: Secondary | ICD-10-CM | POA: Diagnosis not present

## 2020-01-05 DIAGNOSIS — G40909 Epilepsy, unspecified, not intractable, without status epilepticus: Secondary | ICD-10-CM | POA: Diagnosis present

## 2020-01-05 DIAGNOSIS — L089 Local infection of the skin and subcutaneous tissue, unspecified: Secondary | ICD-10-CM | POA: Diagnosis not present

## 2020-01-05 DIAGNOSIS — E119 Type 2 diabetes mellitus without complications: Secondary | ICD-10-CM

## 2020-01-05 DIAGNOSIS — F419 Anxiety disorder, unspecified: Secondary | ICD-10-CM | POA: Diagnosis not present

## 2020-01-05 DIAGNOSIS — Z419 Encounter for procedure for purposes other than remedying health state, unspecified: Secondary | ICD-10-CM

## 2020-01-05 DIAGNOSIS — E861 Hypovolemia: Secondary | ICD-10-CM | POA: Diagnosis not present

## 2020-01-05 LAB — COMPREHENSIVE METABOLIC PANEL
ALT: 18 U/L (ref 0–44)
AST: 19 U/L (ref 15–41)
Albumin: 3.4 g/dL — ABNORMAL LOW (ref 3.5–5.0)
Alkaline Phosphatase: 78 U/L (ref 38–126)
Anion gap: 12 (ref 5–15)
BUN: 26 mg/dL — ABNORMAL HIGH (ref 8–23)
CO2: 27 mmol/L (ref 22–32)
Calcium: 9 mg/dL (ref 8.9–10.3)
Chloride: 97 mmol/L — ABNORMAL LOW (ref 98–111)
Creatinine, Ser: 6.66 mg/dL — ABNORMAL HIGH (ref 0.44–1.00)
GFR calc Af Amer: 6 mL/min — ABNORMAL LOW (ref >60)
GFR calc non Af Amer: 5 mL/min — ABNORMAL LOW (ref >60)
Glucose, Bld: 144 mg/dL — ABNORMAL HIGH (ref 70–99)
Potassium: 4.6 mmol/L (ref 3.5–5.1)
Sodium: 136 mmol/L (ref 135–145)
Total Bilirubin: 1.2 mg/dL (ref 0.3–1.2)
Total Protein: 7.8 g/dL (ref 6.5–8.1)

## 2020-01-05 LAB — CBC WITH DIFFERENTIAL/PLATELET
Abs Immature Granulocytes: 0.29 10*3/uL — ABNORMAL HIGH (ref 0.00–0.07)
Basophils Absolute: 0.1 10*3/uL (ref 0.0–0.1)
Basophils Relative: 0 %
Eosinophils Absolute: 0 10*3/uL (ref 0.0–0.5)
Eosinophils Relative: 0 %
HCT: 37.7 % (ref 36.0–46.0)
Hemoglobin: 12.4 g/dL (ref 12.0–15.0)
Immature Granulocytes: 1 %
Lymphocytes Relative: 3 %
Lymphs Abs: 0.7 10*3/uL (ref 0.7–4.0)
MCH: 31.4 pg (ref 26.0–34.0)
MCHC: 32.9 g/dL (ref 30.0–36.0)
MCV: 95.4 fL (ref 80.0–100.0)
Monocytes Absolute: 2.1 10*3/uL — ABNORMAL HIGH (ref 0.1–1.0)
Monocytes Relative: 9 %
Neutro Abs: 20.4 10*3/uL — ABNORMAL HIGH (ref 1.7–7.7)
Neutrophils Relative %: 87 %
Platelets: 83 10*3/uL — ABNORMAL LOW (ref 150–400)
RBC: 3.95 MIL/uL (ref 3.87–5.11)
RDW: 16.3 % — ABNORMAL HIGH (ref 11.5–15.5)
WBC: 23.6 10*3/uL — ABNORMAL HIGH (ref 4.0–10.5)
nRBC: 0 % (ref 0.0–0.2)

## 2020-01-05 LAB — PROTIME-INR
INR: 1.4 — ABNORMAL HIGH (ref 0.8–1.2)
Prothrombin Time: 16.9 seconds — ABNORMAL HIGH (ref 11.4–15.2)

## 2020-01-05 LAB — APTT: aPTT: 53 seconds — ABNORMAL HIGH (ref 24–36)

## 2020-01-05 LAB — HEMOGLOBIN A1C
Hgb A1c MFr Bld: 7.7 % — ABNORMAL HIGH (ref 4.8–5.6)
Mean Plasma Glucose: 174.29 mg/dL

## 2020-01-05 LAB — LACTIC ACID, PLASMA: Lactic Acid, Venous: 1.4 mmol/L (ref 0.5–1.9)

## 2020-01-05 LAB — CBG MONITORING, ED
Glucose-Capillary: 141 mg/dL — ABNORMAL HIGH (ref 70–99)
Glucose-Capillary: 148 mg/dL — ABNORMAL HIGH (ref 70–99)

## 2020-01-05 LAB — SARS CORONAVIRUS 2 BY RT PCR (HOSPITAL ORDER, PERFORMED IN ~~LOC~~ HOSPITAL LAB): SARS Coronavirus 2: NEGATIVE

## 2020-01-05 MED ORDER — INSULIN ASPART 100 UNIT/ML ~~LOC~~ SOLN
0.0000 [IU] | Freq: Three times a day (TID) | SUBCUTANEOUS | Status: DC
  Administered 2020-01-06: 2 [IU] via SUBCUTANEOUS
  Administered 2020-01-07 (×2): 1 [IU] via SUBCUTANEOUS
  Administered 2020-01-08: 2 [IU] via SUBCUTANEOUS
  Administered 2020-01-10 – 2020-01-11 (×2): 1 [IU] via SUBCUTANEOUS
  Administered 2020-01-11: 3 [IU] via SUBCUTANEOUS
  Administered 2020-01-12 (×2): 2 [IU] via SUBCUTANEOUS
  Administered 2020-01-12: 3 [IU] via SUBCUTANEOUS
  Administered 2020-01-14: 1 [IU] via SUBCUTANEOUS
  Administered 2020-01-15: 2 [IU] via SUBCUTANEOUS
  Administered 2020-01-15: 1 [IU] via SUBCUTANEOUS
  Administered 2020-01-16 – 2020-01-17 (×3): 2 [IU] via SUBCUTANEOUS

## 2020-01-05 MED ORDER — LABETALOL HCL 100 MG PO TABS
300.0000 mg | ORAL_TABLET | Freq: Two times a day (BID) | ORAL | Status: DC
  Administered 2020-01-05 – 2020-01-08 (×7): 300 mg via ORAL
  Filled 2020-01-05 (×2): qty 3
  Filled 2020-01-05 (×2): qty 2
  Filled 2020-01-05 (×3): qty 3

## 2020-01-05 MED ORDER — ACETAMINOPHEN 325 MG PO TABS: 650.0000 mg | ORAL_TABLET | Freq: Four times a day (QID) | ORAL | Status: DC | PRN

## 2020-01-05 MED ORDER — ONDANSETRON HCL 4 MG PO TABS: 4.0000 mg | ORAL_TABLET | Freq: Four times a day (QID) | ORAL | Status: DC | PRN

## 2020-01-05 MED ORDER — ACETAMINOPHEN 500 MG PO TABS
1000.0000 mg | ORAL_TABLET | Freq: Four times a day (QID) | ORAL | Status: DC | PRN
  Administered 2020-01-07 – 2020-01-17 (×13): 1000 mg via ORAL
  Filled 2020-01-05 (×13): qty 2

## 2020-01-05 MED ORDER — RENA-VITE PO TABS
1.0000 | ORAL_TABLET | Freq: Every day | ORAL | Status: DC
  Administered 2020-01-05 – 2020-01-16 (×12): 1 via ORAL
  Filled 2020-01-05 (×13): qty 1

## 2020-01-05 MED ORDER — SODIUM CHLORIDE 0.9 % IV SOLN
1000.0000 mL | INTRAVENOUS | Status: DC
  Administered 2020-01-05: 1000 mL via INTRAVENOUS

## 2020-01-05 MED ORDER — SODIUM CHLORIDE 0.9 % IV SOLN
2.0000 g | INTRAVENOUS | Status: DC
  Administered 2020-01-05: 2 g via INTRAVENOUS
  Filled 2020-01-05: qty 20

## 2020-01-05 MED ORDER — ACETAMINOPHEN 650 MG RE SUPP: 650.0000 mg | Freq: Four times a day (QID) | RECTAL | Status: DC | PRN

## 2020-01-05 MED ORDER — OXYCODONE HCL 5 MG PO TABS
10.0000 mg | ORAL_TABLET | ORAL | Status: AC | PRN
  Administered 2020-01-05 – 2020-01-08 (×3): 10 mg via ORAL
  Filled 2020-01-05 (×3): qty 2

## 2020-01-05 MED ORDER — SODIUM CHLORIDE 0.9% FLUSH
3.0000 mL | Freq: Once | INTRAVENOUS | Status: AC
  Administered 2020-01-05: 3 mL via INTRAVENOUS

## 2020-01-05 MED ORDER — INSULIN NPH (HUMAN) (ISOPHANE) 100 UNIT/ML ~~LOC~~ SUSP
14.0000 [IU] | Freq: Two times a day (BID) | SUBCUTANEOUS | Status: DC
  Administered 2020-01-06 – 2020-01-09 (×7): 14 [IU] via SUBCUTANEOUS
  Filled 2020-01-05 (×2): qty 10

## 2020-01-05 MED ORDER — GABAPENTIN 300 MG PO CAPS
300.0000 mg | ORAL_CAPSULE | Freq: Every day | ORAL | Status: DC
  Administered 2020-01-05 – 2020-01-17 (×13): 300 mg via ORAL
  Filled 2020-01-05 (×13): qty 1

## 2020-01-05 MED ORDER — HYDRALAZINE HCL 25 MG PO TABS: 25.0000 mg | ORAL_TABLET | Freq: Four times a day (QID) | ORAL | Status: DC | PRN

## 2020-01-05 MED ORDER — ASPIRIN 81 MG PO CHEW
162.0000 mg | CHEWABLE_TABLET | Freq: Every day | ORAL | Status: DC
  Administered 2020-01-06 – 2020-01-17 (×11): 162 mg via ORAL
  Filled 2020-01-05 (×13): qty 2

## 2020-01-05 MED ORDER — SODIUM CHLORIDE 0.9 % IV SOLN
1.0000 g | INTRAVENOUS | Status: DC
  Administered 2020-01-06: 1 g via INTRAVENOUS
  Filled 2020-01-05: qty 10

## 2020-01-05 MED ORDER — ONDANSETRON HCL 4 MG/2ML IJ SOLN
4.0000 mg | Freq: Four times a day (QID) | INTRAMUSCULAR | Status: DC | PRN
  Administered 2020-01-14 (×2): 4 mg via INTRAVENOUS
  Filled 2020-01-05 (×2): qty 2

## 2020-01-05 NOTE — H&P (Signed)
History and Physical    [name] RJJ:[id number] DOB: 07/25/1940 DOA: 01/05/2020  PCP: [name], MD  Patient coming from: Podiatry office  I have personally briefly reviewed patient's old medical records in [place]  Chief Complaint: Right foot cellulitis  HPI: [name] is a 79 y.o. female with medical history significant for ESRD on MWF HD, IDT2DM, history of CVA, HTN, who presents to the ED for evaluation of right foot cellulitis.  Patient states she has been following with her podiatrist regularly for management of an ulcer on the plantar surface of the right foot at the base of the first MTP.  She had been wearing a boot for several weeks he has noticed some swelling in her right leg since then.  She went to see her podiatrist again today (01/05/2020) who felt she was developing cellulitis of the right foot.  She was subsequently sent to the ED for further evaluation and management.  Patient otherwise denies any subjective fevers, chills, diaphoresis, chest pain, dyspnea, abdominal pain, nausea, vomiting.  She states that she does still make some urine without dysuria.  ED Course:  Initial vitals showed BP 180/66, pulse 92, RR 18, temp 99.9 Fahrenheit, SPO2 98% on room air.  Labs are notable for WBC 23.6, hemoglobin 12.4, platelets 83,000 (171,000 previously on 05/15/2019), lactic acid 1.4, sodium 136, potassium 4.6, bicarb 27, BUN 26, creatinine 6.66, LFTs within normal limits.  SARS-CoV-2 PCR is ordered and pending.  Blood cultures obtained and pending.  Right foot x-ray shows soft tissue swelling with ulcer/wound along plantar aspect of the foot at the level of the first MTP.  No definite acute osseous abnormality seen.  Portable chest x-ray shows loop recorder in place.  No focal consolidation, edema, or effusion noted.  Patient was given IV ceftriaxone and the hospitalist service was consulted to admit for further evaluation management.  Review of  Systems: All systems reviewed and are negative except as documented in history of present illness above.   Past Medical History:  Diagnosis Date  . Anemia   . Cataract   . Chronic kidney disease   . CVA (cerebral infarction)   . Diabetes mellitus    Type 2  . Diabetes mellitus without complication ([place])   . Dialysis patient ([place])   . Dialysis patient ([place])    M, W, F  . Fistula    R arm  . GERD (gastroesophageal reflux disease)   . Hypertension   . Renal disorder   . Shortness of breath   . Stroke [facility])    right side weakness    Past Surgical History:  Procedure Laterality Date  . AV FISTULA PLACEMENT Right 09/08/2013   Procedure: CREATION OF RIGHT BRACHIAL CEPHALIC ARTERIOVENOUS FISTULA ;  Surgeon: Mal Misty, MD;  Location: [place];  Service: Vascular;  Laterality: Right;  . BASCILIC VEIN TRANSPOSITION Right 01/26/2014   Procedure: Right Arm BASILIC VEIN TRANSPOSITION;  Surgeon: Mal Misty, MD;  Location: [place];  Service: Vascular;  Laterality: Right;  . CATARACT EXTRACTION W/PHACO  11/20/2011   Procedure: CATARACT EXTRACTION PHACO AND INTRAOCULAR LENS PLACEMENT (IOC);  Surgeon: Tonny Branch, MD;  Location: [facility];  Service: Ophthalmology;  Laterality: Right;  CDE 18.82  . CATARACT EXTRACTION W/PHACO Left 11/18/2012   Procedure: CATARACT EXTRACTION PHACO AND INTRAOCULAR LENS PLACEMENT (IOC);  Surgeon: Tonny Branch, MD;  Location: [facility];  Service: Ophthalmology;  Laterality: Left;  CDE: 18.97  . COLONOSCOPY N/A 02/09/2013  Procedure: COLONOSCOPY;  Surgeon: Rogene Houston, MD;  [facility];  Service: Endoscopy;  Laterality: N/A;  305-moved to 220 Ann to notify pt  . COLONOSCOPY N/A 07/08/2018   Procedure: COLONOSCOPY;  Surgeon: Rogene Houston, MD;  [facility];  Service: Endoscopy;  Laterality: N/A;  930  . INSERTION OF DIALYSIS CATHETER Right 06/24/2013   Procedure: INSERTION OF DIALYSIS CATHETER: Ultrasound guided;  Surgeon: Serafina Mitchell, MD;   Location: [place];  Service: Vascular;  Laterality: Right;  . LIGATION OF ARTERIOVENOUS  FISTULA Right 01/26/2014   Procedure: LIGATION OF ARTERIOVENOUS  FISTULA;  Surgeon: Mal Misty, MD;  Location: [place];  Service: Vascular;  Laterality: Right;  . 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 LOOP RECORDER IMPLANT N/A 07/21/2013   Procedure: LOOP RECORDER IMPLANT;  Surgeon: Evans Lance, MD;  Location: [facility];  Service: Cardiovascular;  Laterality: N/A;  . POLYPECTOMY  07/08/2018   Procedure: POLYPECTOMY;  Surgeon: Rogene Houston, MD;  [facility];  Service: Endoscopy;;  Descending colon polyps x 2   . TEE WITHOUT CARDIOVERSION N/A 07/21/2013   Procedure: TRANSESOPHAGEAL ECHOCARDIOGRAM (TEE);  Surgeon: Dorothy Spark, MD;  Location: [place];  Service: Cardiovascular;  Laterality: N/A;    Social History:  reports that she has never smoked. She has never used smokeless tobacco. She reports that she does not drink alcohol and does not use drugs.  Allergies  Allergen Reactions  . Ambien [Zolpidem] Other (See Comments)    Hallucinations   . Reglan [Metoclopramide] Other (See Comments)    Hallucinations   . Reglan [Metoclopramide] Other (See Comments)    "makes me crazy"    Family History  Problem Relation Age of Onset  . Cancer Sister   . Cancer Brother   . Anesthesia problems Neg Hx   . Hypotension Neg Hx   . Malignant hyperthermia Neg Hx   . Pseudochol deficiency Neg Hx      Prior to Admission medications   Medication Sig Start Date End Date Taking? Authorizing Provider  albuterol (PROVENTIL HFA;VENTOLIN HFA) 108 (90 Base) MCG/ACT inhaler Inhale 2 puffs into the lungs every 6 (six) hours as needed for wheezing or shortness of breath. 10/16/17   Kathie Dike, MD  aspirin 81 MG chewable tablet Chew 2 tablets (162 mg total) by mouth daily. 07/09/18   Rogene Houston, MD  BD INSULIN SYRINGE U/F 31G X 5/16" 0.3  ML MISC 2 (two) times daily. as directed 09/15/18   [provider]  cyanocobalamin (,VITAMIN B-12,) 1000 MCG/ML injection Inject 1 mL into the muscle every 30 (thirty) days. 11/01/14   [provider]  gabapentin (NEURONTIN) 300 MG capsule  10/29/18   [provider]  ibuprofen (ADVIL,MOTRIN) 200 MG tablet Take 400 mg by mouth every 6 (six) hours as needed for headache or moderate pain.     [provider]  insulin NPH Human (HUMULIN N,NOVOLIN N) 100 UNIT/ML injection Inject 14 Units into the skin 2 (two) times daily before a meal.     [provider]  labetalol (NORMODYNE) 300 MG tablet Take 300 mg by mouth 2 (two) times daily. 11/06/14   [provider]  levETIRAcetam (KEPPRA) 250 MG tablet Take 250 mg by mouth 2 (two) times daily.     [provider]  lidocaine-prilocaine (EMLA) cream Apply 1 application topically every Monday, Wednesday, and Friday.  07/01/15   [provider]  multivitamin (RENA-VIT) TABS tablet Take 1 tablet by mouth at bedtime. 08/21/14   Kirsteins, Luanna Salk, MD  sevelamer carbonate (RENVELA) 800 MG tablet Take 800 mg by mouth 3 (three) times daily with meals.  12/24/17   [provider]    Physical Exam: Vitals:   01/05/20 1523 01/05/20 1530 01/05/20 1745 01/05/20 1819  BP:  (!) 180/66 (!) 184/85   Pulse: 92  93 95  Resp: 18  19 (!) 25  Temp: 99.9 F (37.7 C)     TempSrc: Oral     SpO2: 98%  100% 97%  Weight: 81.6 kg     Height: 5\' 8"  (1.727 m)      Constitutional: Elderly woman resting supine in bed, NAD, calm, comfortable Eyes: PERRL, lids and conjunctivae normal ENMT: Mucous membranes are moist. Posterior pharynx clear of any exudate or lesions.Normal dentition.  Neck: normal, supple, no masses. Respiratory: clear to auscultation bilaterally, no wheezing, no crackles. Normal respiratory effort. No accessory muscle use.  Cardiovascular: Regular rate and rhythm, no murmurs / rubs /  gallops.  Trace right lower extremity edema. 2+ pedal pulses.  RUE aVF with palpable thrill. Abdomen: no tenderness, no masses palpated. No hepatosplenomegaly. Bowel sounds positive.  Musculoskeletal: no clubbing / cyanosis. No joint deformity upper and lower extremities. Good ROM, no contractures. Normal muscle tone.  Skin: Erythema.  Dorsal aspect of right foot.  Chronic appearing ulcer at base of right first MTP plantar surface as pictured below.  No active drainage. Neurologic: CN 2-12 grossly intact. Sensation intact, Strength 5/5 in all 4.  Psychiatric: Normal judgment and insight. Alert and oriented x 3. Normal mood.       Labs on Admission: I have personally reviewed following labs and imaging studies  CBC: Recent Labs  Lab 01/05/20 1535  WBC 23.6*  NEUTROABS 20.4*  HGB 12.4  HCT 37.7  MCV 95.4  PLT 83*   Basic Metabolic Panel: Recent Labs  Lab 01/05/20 1535  NA 136  K 4.6  CL 97*  CO2 27  GLUCOSE 144*  BUN 26*  CREATININE 6.66*  CALCIUM 9.0   GFR: Estimated Creatinine Clearance: 7.6 mL/min (A) (by C-G formula based on SCr of 6.66 mg/dL (H)). Liver Function Tests: Recent Labs  Lab 01/05/20 1535  AST 19  ALT 18  ALKPHOS 78  BILITOT 1.2  PROT 7.8  ALBUMIN 3.4*   No results for input(s): LIPASE, AMYLASE in the last 168 hours. No results for input(s): AMMONIA in the last 168 hours. Coagulation Profile: No results for input(s): INR, PROTIME in the last 168 hours. Cardiac Enzymes: No results for input(s): CKTOTAL, CKMB, CKMBINDEX, TROPONINI in the last 168 hours. BNP (last 3 results) No results for input(s): PROBNP in the last 8760 hours. HbA1C: No results for input(s): HGBA1C in the last 72 hours. CBG: Recent Labs  Lab 01/05/20 1532  GLUCAP 141*   Lipid Profile: No results for input(s): CHOL, HDL, LDLCALC, TRIG, CHOLHDL, LDLDIRECT in the last 72 hours. Thyroid Function Tests: No results for input(s): TSH, T4TOTAL, FREET4, T3FREE, THYROIDAB in  the last 72 hours. Anemia Panel: No results for input(s): VITAMINB12, FOLATE, FERRITIN, TIBC, IRON, RETICCTPCT in the last 72 hours. Urine analysis:    Component Value Date/Time   COLORURINE YELLOW 10/15/2017 2244   APPEARANCEUR HAZY (A) 10/15/2017 2244   LABSPEC 1.015 10/15/2017 2244   PHURINE 7.0 10/15/2017 2244   GLUCOSEU >=500 (A) 10/15/2017 2244   HGBUR SMALL (A)  10/15/2017 [place] 10/15/2017 2244   KETONESUR NEGATIVE 10/15/2017 2244   PROTEINUR 100 (A) 10/15/2017 2244   UROBILINOGEN 0.2 11/19/2014 0909   NITRITE NEGATIVE 10/15/2017 2244   LEUKOCYTESUR MODERATE (A) 10/15/2017 2244    Radiological Exams on Admission: DG Chest Port 1 View  Result Date: 01/05/2020 CLINICAL DATA:  Sepsis, wound infection EXAM: PORTABLE CHEST 1 VIEW COMPARISON:  Radiograph 02/19/2018 FINDINGS: Stable mild hyperinflation with some coarsened interstitial changes. No consolidation, features of edema, pneumothorax, or effusion. Cardiomediastinal contours are unremarkable for the portable technique. Pulmonary vascularity normally distributed. Loop recorder projects over the left heart border. Vascular stent noted in the right axillary region. Telemetry leads overlie the chest. No acute osseous or soft tissue abnormality. Degenerative changes are present in the imaged spine and shoulders. IMPRESSION: 1. No acute cardiopulmonary disease. 2. Stable mild hyperinflation and coarsened interstitial changes. Electronically Signed   By: Lovena Le M.D.   On: 01/05/2020 18:57   DG Foot Complete Right  Result Date: 01/05/2020 CLINICAL DATA:  Wound infection diabetic patient EXAM: RIGHT FOOT COMPLETE - 3+ VIEW COMPARISON:  10/13/2019 FINDINGS: Bones appear demineralized. There are vascular calcifications. There is prominent soft tissue swelling. Ulcer or wound along the plantar aspect of the foot at the level of the first MTP. No definite periostitis or cortical bone destruction in the region.  Moderate plantar calcaneal spur. IMPRESSION: Soft tissue swelling with ulcer or wound along the plantar aspect of the foot at the level of the first MTP. No definite acute osseous abnormality is seen. Electronically Signed   By: Donavan Foil M.D.   On: 01/05/2020 15:59    EKG: Independently reviewed. Sinus rhythm without acute ischemic changes.  Rate is faster when compared to prior.  Assessment/Plan Principal Problem:   Cellulitis of right foot Active Problems:   Diabetes mellitus, type 2 (H[facility])   Leukocytosis   Hypertension associated with diabetes ([place])   ESRD (end stage renal disease) on dialysis ([place])   History of CVA (cerebrovascular accident)   Thrombocytopenia ([place])   Diabetic ulcer of right foot associated with diabetes mellitus due to underlying condition ([place])  [name] is a 79 y.o. female with medical history significant for ESRD on MWF HD, IDT2DM, history of CVA, HTN, who is admitted with cellulitis of right foot.  Cellulitis of right foot associated with diabetic ulcer: Patient with cellulitis distal dorsal right foot with associated leukocytosis.  Has plantar diabetic ulcer at base of first MTP without obvious drainage.  Right foot x-ray without evidence of osteomyelitis.  She does have trace unilateral right lower extremity edema up to the knee. -Continue IV ceftriaxone -Check RLE ultrasound to assess for DVT -Consult to wound care  ESRD on MWF HD: Currently no emergent need for dialysis.  Will need nephrology consult in the morning for usual HD.  Thrombocytopenia: Suspect reactive due to acute infection.  Hold pharmacologic VTE prophylaxis and continue to monitor.  Insulin-dependent type 2 diabetes: Resume home Novolin 14 units BIDAC.  Add very sensitive SSI.  Hypertension: Resume home labetalol.  Use hydralazine as needed.  History of CVA: Continue home aspirin, monitor platelet counts as above.  Not currently on statin therapy.  DVT prophylaxis:  SCDs Code Status: Full code, confirmed with patient Family Communication: Discussed with patient's daughter at bedside. Disposition Plan: From home, likely discharge to home Consults called: None Admission status:  Status is: Observation  The patient remains OBS appropriate and will d/c before 2 midnights.  Dispo: The patient is from: Home              Anticipated d/c is to: Home              Anticipated d/c date is: 1 day pending improvement right foot cellulitis and ability to transition to oral antibiotics.              Patient currently is not medically stable to d/c.   Zada Finders MD [facility]  If 7PM-7AM, please contact night-coverage [email or web address]  01/05/2020, 7:07 PM

## 2020-01-05 NOTE — ED Provider Notes (Signed)
Medical screening examination/treatment/procedure(s) were conducted as a shared visit with non-physician practitioner(s) and myself.  I personally evaluated the patient during the encounter.  Clinical Impression:   Final diagnoses:  Sepsis due to cellulitis [facility])    This patient is a 79 year old female, she has a known history of end-stage renal disease on dialysis, last dialyzed yesterday.  She is also known to have seizures for which she takes Keppra, diabetes for which she takes insulin and some lung disease for which she uses albuterol.  She presents with worsening swelling of her right foot which is now spread up towards her knee.  She was at her podiatrist office where they noticed the significant swelling and redness with tenderness and referred her to the emergency department.  On exam she does not fact have an ulceration on the plantar aspect of the right foot, there is no purulent drainage however the foot is about twice the size of the left, it is red, hot and tender to the touch.  There is no crepitance or subcutaneous emphysema in the x-ray reveals no signs of free air.  She does have a severe leukocytosis and in conjunction with her symptoms and low-grade fever of 99.9 I suspect that she has early sepsis from a skin and soft tissue infection.  The patient will need IV antibiotics and admission to the hospital.  Thankfully lactic acid is normal.she is not in severe sepsis or shock.  She has underlying kidney disease explaining her elevated creatinine.  Patient is agreeable to the plan, will consult with hospitalist for admission    Noemi Chapel, MD 01/05/20 2144

## 2020-01-05 NOTE — ED Provider Notes (Signed)
[facility] EMERGENCY DEPARTMENT Provider Note   CSN: [id number] Arrival date & time: 01/05/20  1518     History Chief Complaint  Patient presents with  . Wound Infection    [name] is a 79 y.o. female with pertinent past medical history of anemia, chronic kidney disease, diabetes, dialysis every Monday Wednesday Friday, last dialyzed yesterday, hypertension, stroke that presents to the emergency department for wound infection on foot.  States that she went to see her podiatrist and they sent her immediately here.  Patient states that she has had this wound on her foot for 3 months, has been seeing podiatrist weekly.  States that they have been caring for the wound and have been dressing it appropriately.  States that the past couple days she noticed that her foot has become extremely swollen and the past night has become red.  Redness spread out up into her ankle that started today.  States that has been difficult to walk on her foot.  States that she did have ulcerations due to diabetes.  Patient states that she has been compliant with her diabetes medication.  Patient lives with daughter and sons.  Denies any fevers, chills, chest pain, shortness of breath, back pain, abdominal pain, vomiting.  Patient states that she did feel slightly nauseous earlier today.  Has not vomited.  Does not feel nauseous anymore.  States that she has been taking Tylenol around-the-clock.  HPI     Past Medical History:  Diagnosis Date  . Anemia   . Cataract   . Chronic kidney disease   . CVA (cerebral infarction)   . Diabetes mellitus    Type 2  . Diabetes mellitus without complication ([place])   . Dialysis patient ([place])   . Dialysis patient ([place])    M, W, F  . Fistula    R arm  . GERD (gastroesophageal reflux disease)   . Hypertension   . Renal disorder   . Shortness of breath   . Stroke [facility])    right side weakness    Patient Active Problem List   Diagnosis Date  Noted  . Cellulitis of right foot 01/05/2020  . History of colonic polyps 04/01/2018  . Acute bronchitis 10/15/2017  . DM type 2, goal A1c below 7   . HCAP (healthcare-associated pneumonia) 05/29/2015  . Hyperkalemia 05/29/2015  . Diabetes mellitus with complication ([place])   . ESRD (end stage renal disease) on dialysis ([place])   . Encounter for adequacy testing for hemodialysis ([place]) 01/17/2014  . Mechanical complication of other vascular device, implant, and graft 01/17/2014  . Disturbances of vision, late effect of stroke 11/03/2013  . Right hemiparesis ([place]) 08/19/2013  . CVA (cerebral infarction) 07/20/2013  . Gastroparesis due to DM ([place]) 07/01/2013  . End stage renal disease ([place]) 06/25/2013  . Anemia 01/26/2013  . DM W/NEURO MNFST, TYPE II, UNCONTROLLED 11/19/2006  . Leukocytosis 11/19/2006  . DEPENDENT EDEMA, LEGS, BILATERAL 11/19/2006  . SINUS TACHYCARDIA 10/16/2006  . Diabetes mellitus, type 2 ([place]) 05/20/2006  . HLD (hyperlipidemia) 05/20/2006  . SYNDROME, RESTLESS LEGS 05/20/2006  . PERIPHERAL NEUROPATHY 05/20/2006  . Hypertension 05/20/2006    Past Surgical History:  Procedure Laterality Date  . AV FISTULA PLACEMENT Right 09/08/2013   Procedure: CREATION OF RIGHT BRACHIAL CEPHALIC ARTERIOVENOUS FISTULA ;  Surgeon: Mal Misty, MD;  Location: [place];  Service: Vascular;  Laterality: Right;  . BASCILIC VEIN TRANSPOSITION Right 01/26/2014   Procedure: Right Arm BASILIC VEIN TRANSPOSITION;  Surgeon: Mal Misty, MD;  Location: [place];  Service: Vascular;  Laterality: Right;  . CATARACT EXTRACTION W/PHACO  11/20/2011   Procedure: CATARACT EXTRACTION PHACO AND INTRAOCULAR LENS PLACEMENT (IOC);  Surgeon: Tonny Branch, MD;  Location: [facility];  Service: Ophthalmology;  Laterality: Right;  CDE 18.82  . CATARACT EXTRACTION W/PHACO Left 11/18/2012   Procedure: CATARACT EXTRACTION PHACO AND INTRAOCULAR LENS PLACEMENT (IOC);  Surgeon: Tonny Branch, MD;  Location: [facility];  Service:  Ophthalmology;  Laterality: Left;  CDE: 18.97  . COLONOSCOPY N/A 02/09/2013   Procedure: COLONOSCOPY;  Surgeon: Rogene Houston, MD;  [facility];  Service: Endoscopy;  Laterality: N/A;  305-moved to 220 Ann to notify pt  . COLONOSCOPY N/A 07/08/2018   Procedure: COLONOSCOPY;  Surgeon: Rogene Houston, MD;  [facility];  Service: Endoscopy;  Laterality: N/A;  930  . INSERTION OF DIALYSIS CATHETER Right 06/24/2013   Procedure: INSERTION OF DIALYSIS CATHETER: Ultrasound guided;  Surgeon: Serafina Mitchell, MD;  Location: [place];  Service: Vascular;  Laterality: Right;  . LIGATION OF ARTERIOVENOUS  FISTULA Right 01/26/2014   Procedure: LIGATION OF ARTERIOVENOUS  FISTULA;  Surgeon: Mal Misty, MD;  Location: [place];  Service: Vascular;  Laterality: Right;  . 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 LOOP RECORDER IMPLANT N/A 07/21/2013   Procedure: LOOP RECORDER IMPLANT;  Surgeon: Evans Lance, MD;  Location: [facility] CATH LAB;  Service: Cardiovascular;  Laterality: N/A;  . POLYPECTOMY  07/08/2018   Procedure: POLYPECTOMY;  Surgeon: Rogene Houston, MD;  [facility];  Service: Endoscopy;;  Descending colon polyps x 2   . TEE WITHOUT CARDIOVERSION N/A 07/21/2013   Procedure: TRANSESOPHAGEAL ECHOCARDIOGRAM (TEE);  Surgeon: Dorothy Spark, MD;  Location: [facility] ENDOSCOPY;  Service: Cardiovascular;  Laterality: N/A;     OB History    Gravida  0   Para  0   Term  0   Preterm  0   AB  0   Living        SAB  0   TAB  0   Ectopic  0   Multiple      Live Births              Family History  Problem Relation Age of Onset  . Cancer Sister   . Cancer Brother   . Anesthesia problems Neg Hx   . Hypotension Neg Hx   . Malignant hyperthermia Neg Hx   . Pseudochol deficiency Neg Hx     Social History   Tobacco Use  . Smoking status: Never Smoker  . Smokeless tobacco: Never Used  Vaping Use  . Vaping Use:  Never used  Substance Use Topics  . Alcohol use: No  . Drug use: No    Home Medications Prior to Admission medications   Medication Sig Start Date End Date Taking? Authorizing Provider  albuterol (PROVENTIL HFA;VENTOLIN HFA) 108 (90 Base) MCG/ACT inhaler Inhale 2 puffs into the lungs every 6 (six) hours as needed for wheezing or shortness of breath. 10/16/17   Kathie Dike, MD  aspirin 81 MG chewable tablet Chew 2 tablets (162 mg total) by mouth daily. 07/09/18   Rogene Houston, MD  BD INSULIN SYRINGE U/F 31G X 5/16" 0.3 ML MISC 2 (two) times daily. as directed 09/15/18   [provider]  cyanocobalamin (,VITAMIN B-12,) 1000 MCG/ML injection Inject 1 mL  into the muscle every 30 (thirty) days. 11/01/14   [provider]  gabapentin (NEURONTIN) 300 MG capsule  10/29/18   [provider]  ibuprofen (ADVIL,MOTRIN) 200 MG tablet Take 400 mg by mouth every 6 (six) hours as needed for headache or moderate pain.     [provider]  insulin NPH Human (HUMULIN N,NOVOLIN N) 100 UNIT/ML injection Inject 14 Units into the skin 2 (two) times daily before a meal.     [provider]  labetalol (NORMODYNE) 300 MG tablet Take 300 mg by mouth 2 (two) times daily. 11/06/14   [provider]  levETIRAcetam (KEPPRA) 250 MG tablet Take 250 mg by mouth 2 (two) times daily.     [provider]  lidocaine-prilocaine (EMLA) cream Apply 1 application topically every Monday, Wednesday, and Friday.  07/01/15   [provider]  multivitamin (RENA-VIT) TABS tablet Take 1 tablet by mouth at bedtime. 08/21/14   Kirsteins, Luanna Salk, MD  sevelamer carbonate (RENVELA) 800 MG tablet Take 800 mg by mouth 3 (three) times daily with meals.  12/24/17   [provider]    Allergies    Ambien [zolpidem], Reglan [metoclopramide], and Reglan [metoclopramide]  Review of Systems   Review of Systems  Constitutional: Negative for chills, diaphoresis, fatigue and  fever.  HENT: Negative for congestion, sore throat and trouble swallowing.   Eyes: Negative for pain and visual disturbance.  Respiratory: Negative for cough, shortness of breath and wheezing.   Cardiovascular: Negative for chest pain, palpitations and leg swelling.  Gastrointestinal: Negative for abdominal distention, abdominal pain, diarrhea, nausea and vomiting.  Genitourinary: Negative for difficulty urinating.  Musculoskeletal: Negative for back pain, neck pain and neck stiffness.  Skin: Positive for wound. Negative for pallor.  Neurological: Negative for dizziness, speech difficulty, weakness and headaches.  Psychiatric/Behavioral: Negative for confusion.    Physical Exam Updated Vital Signs BP (!) 184/85   Pulse 95   Temp 99.9 F (37.7 C) (Oral)   Resp (!) 25   Ht 5\' 8"  (1.727 m)   Wt 81.6 kg   SpO2 97%   BMI 27.37 kg/m   Physical Exam Constitutional:      General: She is not in acute distress.    Appearance: Normal appearance. She is not ill-appearing, toxic-appearing or diaphoretic.  HENT:     Mouth/Throat:     Mouth: Mucous membranes are moist.     Pharynx: Oropharynx is clear.  Eyes:     General: No scleral icterus.    Extraocular Movements: Extraocular movements intact.     Pupils: Pupils are equal, round, and reactive to light.  Cardiovascular:     Rate and Rhythm: Normal rate and regular rhythm.     Pulses: Normal pulses.     Heart sounds: Normal heart sounds.  Pulmonary:     Effort: Pulmonary effort is normal. No respiratory distress.     Breath sounds: Normal breath sounds. No stridor. No wheezing, rhonchi or rales.  Chest:     Chest wall: No tenderness.  Abdominal:     General: Abdomen is flat. There is no distension.     Palpations: Abdomen is soft.     Tenderness: There is no abdominal tenderness. There is no guarding or rebound.  Musculoskeletal:        General: Swelling and tenderness present. Normal range of motion.     Cervical back: Normal  range of motion and neck supple. No rigidity.     Right lower leg:  No edema.     Left lower leg: No edema.  Skin:    General: Skin is warm and dry.     Capillary Refill: Capillary refill takes less than 2 seconds.     Coloration: Skin is not pale.     Findings: Erythema present.     Comments: Patient with dime shaped ulceration on plantar foot, not actively draining.  Cellulitis surrounding midfoot extending up into ankle.  Edema and erythema noted in this region as well.  No streaking noted.  Patient is able to move all toes and foot and ankle with normal strength.  Normal sensation throughout.  DP PT pulses 2+.  Other extremities normal.  Neurological:     General: No focal deficit present.     Mental Status: She is alert and oriented to person, place, and time.     Cranial Nerves: No cranial nerve deficit.     Sensory: No sensory deficit.     Motor: No weakness.  Psychiatric:        Mood and Affect: Mood normal.        Behavior: Behavior normal.     ED Results / Procedures / Treatments   Labs (all labs ordered are listed, but only abnormal results are displayed) Labs Reviewed  COMPREHENSIVE METABOLIC PANEL - Abnormal; Notable for the following components:      Result Value   Chloride 97 (*)    Glucose, Bld 144 (*)    BUN 26 (*)    Creatinine, Ser 6.66 (*)    Albumin 3.4 (*)    GFR calc non Af Amer 5 (*)    GFR calc Af Amer 6 (*)    All other components within normal limits  CBC WITH DIFFERENTIAL/PLATELET - Abnormal; Notable for the following components:   WBC 23.6 (*)    RDW 16.3 (*)    Platelets 83 (*)    Neutro Abs 20.4 (*)    Monocytes Absolute 2.1 (*)    Abs Immature Granulocytes 0.29 (*)    All other components within normal limits  CBG MONITORING, ED - Abnormal; Notable for the following components:   Glucose-Capillary 141 (*)    All other components within normal limits  CULTURE, BLOOD (ROUTINE X 2)  CULTURE, BLOOD (ROUTINE X 2)  URINE CULTURE  SARS  CORONAVIRUS 2 BY RT PCR (HOSPITAL ORDER, [facility])  LACTIC ACID, PLASMA  LACTIC ACID, PLASMA  URINALYSIS, ROUTINE W REFLEX MICROSCOPIC  APTT  PROTIME-INR    EKG EKG Interpretation  Date/Time:  Thursday January 05 2020 18:18:25 EDT Ventricular Rate:  94 PR Interval:    QRS Duration: 87 QT Interval:  350 QTC Calculation: 438 R Axis:   40 Text Interpretation: Sinus rhythm Minimal ST elevation, anterior leads Since last tracing rate faster Confirmed by Noemi Chapel [phone]) on 01/05/2020 6:28:56 PM   Radiology DG Foot Complete Right  Result Date: 01/05/2020 CLINICAL DATA:  Wound infection diabetic patient EXAM: RIGHT FOOT COMPLETE - 3+ VIEW COMPARISON:  10/13/2019 FINDINGS: Bones appear demineralized. There are vascular calcifications. There is prominent soft tissue swelling. Ulcer or wound along the plantar aspect of the foot at the level of the first MTP. No definite periostitis or cortical bone destruction in the region. Moderate plantar calcaneal spur. IMPRESSION: Soft tissue swelling with ulcer or wound along the plantar aspect of the foot at the level of the first MTP. No definite acute osseous abnormality is seen. Electronically Signed   By: Maudie Mercury  Francoise Ceo M.D.   On: 01/05/2020 15:59    Procedures .Critical Care Performed by: Alfredia Client, PA-C Authorized by: Alfredia Client, PA-C   Critical care provider statement:    Critical care time (minutes):  45   Critical care was necessary to treat or prevent imminent or life-threatening deterioration of the following conditions:  Sepsis   Critical care was time spent personally by me on the following activities:  Discussions with consultants, evaluation of patient's response to treatment, examination of patient, ordering and performing treatments and interventions, ordering and review of laboratory studies, ordering and review of radiographic studies, pulse oximetry, re-evaluation of patient's condition,  obtaining history from patient or surrogate and review of old charts   (including critical care time)  Medications Ordered in ED Medications  sodium chloride flush (NS) 0.9 % injection 3 mL (has no administration in time range)  0.9 %  sodium chloride infusion (1,000 mLs Intravenous New Bag/Given 01/05/20 1811)  cefTRIAXone (ROCEPHIN) 2 g in sodium chloride 0.9 % 100 mL IVPB (2 g Intravenous New Bag/Given 01/05/20 1811)    ED Course  I have reviewed the triage vital signs and the nursing notes.  Pertinent labs & imaging results that were available during my care of the patient were reviewed by me and considered in my medical decision making (see chart for details).    MDM Rules/Calculators/A&P                          MONCIA ANNAS is a 79 y.o. female with pertinent past medical history of anemia, chronic kidney disease, diabetes, dialysis every Monday Wednesday Friday, last dialyzed yesterday, hypertension, stroke that presents the emergency department for wound infection on foot.  Patient appears to have early sepsis with tachycardia and high 90s and low-grade temp of 99.9 patient does have Tylenol on board so this could be masking temperature.  Patient has white count of 23.6.  Will initiate sepsis protocol at this time.  Physical exam with right foot which does appear to have wound on plantar aspect of foot with surrounding erythema and edema and warmth.  X-ray does not show any gas.  Patient to be admitted for sepsis due to soft skin infection.  Blood cultures drawn, ceftriaxone started, IV fluids started.   630 spoke to hospitalist, Dr. Posey Pronto who will accept the patient. The patient appears reasonably stabilized for admission considering the current resources, flow, and capabilities available in the ED at this time, and I doubt any other [facility] requiring further screening and/or treatment in the ED prior to admission.  I discussed this case with my attending physician who cosigned this  note including patient's presenting symptoms, physical exam, and planned diagnostics and interventions. Attending physician stated agreement with plan or made changes to plan which were implemented.   Attending physician assessed patient at bedside.   Final Clinical Impression(s) / ED Diagnoses Final diagnoses:  Sepsis due to cellulitis [facility])    Rx / [place] Orders ED Discharge Orders    None       Alfredia Client, PA-C 01/05/20 1837    Noemi Chapel, MD 01/05/20 2144

## 2020-01-05 NOTE — ED Triage Notes (Signed)
Pt sent to ED by podiatrist for infection to wound on right foot. PT is diabetic and dialysis pt MWF- last dialysis yesterday.

## 2020-01-06 ENCOUNTER — Observation Stay (HOSPITAL_COMMUNITY): Payer: Medicare Other

## 2020-01-06 DIAGNOSIS — I6389 Other cerebral infarction: Secondary | ICD-10-CM | POA: Diagnosis not present

## 2020-01-06 DIAGNOSIS — R609 Edema, unspecified: Secondary | ICD-10-CM | POA: Diagnosis not present

## 2020-01-06 DIAGNOSIS — I6782 Cerebral ischemia: Secondary | ICD-10-CM | POA: Diagnosis not present

## 2020-01-06 DIAGNOSIS — Z992 Dependence on renal dialysis: Secondary | ICD-10-CM | POA: Diagnosis not present

## 2020-01-06 DIAGNOSIS — E871 Hypo-osmolality and hyponatremia: Secondary | ICD-10-CM | POA: Diagnosis not present

## 2020-01-06 DIAGNOSIS — A419 Sepsis, unspecified organism: Secondary | ICD-10-CM | POA: Diagnosis not present

## 2020-01-06 DIAGNOSIS — R600 Localized edema: Secondary | ICD-10-CM | POA: Diagnosis not present

## 2020-01-06 DIAGNOSIS — E1140 Type 2 diabetes mellitus with diabetic neuropathy, unspecified: Secondary | ICD-10-CM | POA: Diagnosis present

## 2020-01-06 DIAGNOSIS — E875 Hyperkalemia: Secondary | ICD-10-CM | POA: Diagnosis present

## 2020-01-06 DIAGNOSIS — D631 Anemia in chronic kidney disease: Secondary | ICD-10-CM | POA: Diagnosis present

## 2020-01-06 DIAGNOSIS — I70211 Atherosclerosis of native arteries of extremities with intermittent claudication, right leg: Secondary | ICD-10-CM | POA: Diagnosis not present

## 2020-01-06 DIAGNOSIS — Z8673 Personal history of transient ischemic attack (TIA), and cerebral infarction without residual deficits: Secondary | ICD-10-CM | POA: Diagnosis not present

## 2020-01-06 DIAGNOSIS — E1152 Type 2 diabetes mellitus with diabetic peripheral angiopathy with gangrene: Secondary | ICD-10-CM | POA: Diagnosis not present

## 2020-01-06 DIAGNOSIS — F419 Anxiety disorder, unspecified: Secondary | ICD-10-CM | POA: Diagnosis not present

## 2020-01-06 DIAGNOSIS — E1122 Type 2 diabetes mellitus with diabetic chronic kidney disease: Secondary | ICD-10-CM | POA: Diagnosis not present

## 2020-01-06 DIAGNOSIS — N2581 Secondary hyperparathyroidism of renal origin: Secondary | ICD-10-CM | POA: Diagnosis present

## 2020-01-06 DIAGNOSIS — I96 Gangrene, not elsewhere classified: Secondary | ICD-10-CM | POA: Diagnosis not present

## 2020-01-06 DIAGNOSIS — I69351 Hemiplegia and hemiparesis following cerebral infarction affecting right dominant side: Secondary | ICD-10-CM | POA: Diagnosis not present

## 2020-01-06 DIAGNOSIS — E519 Thiamine deficiency, unspecified: Secondary | ICD-10-CM | POA: Diagnosis not present

## 2020-01-06 DIAGNOSIS — Z79899 Other long term (current) drug therapy: Secondary | ICD-10-CM | POA: Diagnosis not present

## 2020-01-06 DIAGNOSIS — Z7982 Long term (current) use of aspirin: Secondary | ICD-10-CM | POA: Diagnosis not present

## 2020-01-06 DIAGNOSIS — K219 Gastro-esophageal reflux disease without esophagitis: Secondary | ICD-10-CM | POA: Diagnosis present

## 2020-01-06 DIAGNOSIS — E11621 Type 2 diabetes mellitus with foot ulcer: Secondary | ICD-10-CM | POA: Diagnosis not present

## 2020-01-06 DIAGNOSIS — E8770 Fluid overload, unspecified: Secondary | ICD-10-CM | POA: Diagnosis not present

## 2020-01-06 DIAGNOSIS — L97518 Non-pressure chronic ulcer of other part of right foot with other specified severity: Secondary | ICD-10-CM | POA: Diagnosis not present

## 2020-01-06 DIAGNOSIS — Z20822 Contact with and (suspected) exposure to covid-19: Secondary | ICD-10-CM | POA: Diagnosis not present

## 2020-01-06 DIAGNOSIS — I152 Hypertension secondary to endocrine disorders: Secondary | ICD-10-CM | POA: Diagnosis present

## 2020-01-06 DIAGNOSIS — N186 End stage renal disease: Secondary | ICD-10-CM | POA: Diagnosis not present

## 2020-01-06 DIAGNOSIS — I70212 Atherosclerosis of native arteries of extremities with intermittent claudication, left leg: Secondary | ICD-10-CM | POA: Diagnosis not present

## 2020-01-06 DIAGNOSIS — G9389 Other specified disorders of brain: Secondary | ICD-10-CM | POA: Diagnosis not present

## 2020-01-06 DIAGNOSIS — E1169 Type 2 diabetes mellitus with other specified complication: Secondary | ICD-10-CM | POA: Diagnosis not present

## 2020-01-06 DIAGNOSIS — M869 Osteomyelitis, unspecified: Secondary | ICD-10-CM | POA: Diagnosis not present

## 2020-01-06 DIAGNOSIS — I616 Nontraumatic intracerebral hemorrhage, multiple localized: Secondary | ICD-10-CM | POA: Diagnosis not present

## 2020-01-06 DIAGNOSIS — G92 Toxic encephalopathy: Secondary | ICD-10-CM | POA: Diagnosis not present

## 2020-01-06 DIAGNOSIS — I120 Hypertensive chronic kidney disease with stage 5 chronic kidney disease or end stage renal disease: Secondary | ICD-10-CM | POA: Diagnosis not present

## 2020-01-06 DIAGNOSIS — L03115 Cellulitis of right lower limb: Secondary | ICD-10-CM | POA: Diagnosis not present

## 2020-01-06 DIAGNOSIS — L97519 Non-pressure chronic ulcer of other part of right foot with unspecified severity: Secondary | ICD-10-CM | POA: Diagnosis present

## 2020-01-06 DIAGNOSIS — Z794 Long term (current) use of insulin: Secondary | ICD-10-CM | POA: Diagnosis not present

## 2020-01-06 DIAGNOSIS — L0390 Cellulitis, unspecified: Secondary | ICD-10-CM | POA: Diagnosis not present

## 2020-01-06 DIAGNOSIS — R569 Unspecified convulsions: Secondary | ICD-10-CM | POA: Diagnosis not present

## 2020-01-06 LAB — RENAL FUNCTION PANEL
Albumin: 2.8 g/dL — ABNORMAL LOW (ref 3.5–5.0)
Albumin: 2.9 g/dL — ABNORMAL LOW (ref 3.5–5.0)
Anion gap: 13 (ref 5–15)
Anion gap: 15 (ref 5–15)
BUN: 37 mg/dL — ABNORMAL HIGH (ref 8–23)
BUN: 17 mg/dL (ref 8–23)
CO2: 24 mmol/L (ref 22–32)
CO2: 27 mmol/L (ref 22–32)
Calcium: 8.4 mg/dL — ABNORMAL LOW (ref 8.9–10.3)
Calcium: 8.9 mg/dL (ref 8.9–10.3)
Chloride: 99 mmol/L (ref 98–111)
Chloride: 94 mmol/L — ABNORMAL LOW (ref 98–111)
Creatinine, Ser: 7.65 mg/dL — ABNORMAL HIGH (ref 0.44–1.00)
Creatinine, Ser: 4.53 mg/dL — ABNORMAL HIGH (ref 0.44–1.00)
GFR calc Af Amer: 5 mL/min — ABNORMAL LOW (ref >60)
GFR calc Af Amer: 10 mL/min — ABNORMAL LOW
GFR calc non Af Amer: 5 mL/min — ABNORMAL LOW (ref >60)
GFR calc non Af Amer: 9 mL/min — ABNORMAL LOW
Glucose, Bld: 127 mg/dL — ABNORMAL HIGH (ref 70–99)
Glucose, Bld: 124 mg/dL — ABNORMAL HIGH (ref 70–99)
Phosphorus: 4.6 mg/dL (ref 2.5–4.6)
Phosphorus: 3.2 mg/dL (ref 2.5–4.6)
Potassium: 5.8 mmol/L — ABNORMAL HIGH (ref 3.5–5.1)
Potassium: 4.1 mmol/L (ref 3.5–5.1)
Sodium: 136 mmol/L (ref 135–145)
Sodium: 136 mmol/L (ref 135–145)

## 2020-01-06 LAB — CBC
HCT: 38.3 % (ref 36.0–46.0)
Hemoglobin: 13 g/dL (ref 12.0–15.0)
MCH: 31.3 pg (ref 26.0–34.0)
MCHC: 33.9 g/dL (ref 30.0–36.0)
MCV: 92.3 fL (ref 80.0–100.0)
Platelets: 98 10*3/uL — ABNORMAL LOW (ref 150–400)
RBC: 4.15 MIL/uL (ref 3.87–5.11)
RDW: 15.7 % — ABNORMAL HIGH (ref 11.5–15.5)
WBC: 19.9 10*3/uL — ABNORMAL HIGH (ref 4.0–10.5)
nRBC: 0 % (ref 0.0–0.2)

## 2020-01-06 LAB — CBC WITH DIFFERENTIAL/PLATELET
Abs Immature Granulocytes: 0.17 10*3/uL — ABNORMAL HIGH (ref 0.00–0.07)
Basophils Absolute: 0 10*3/uL (ref 0.0–0.1)
Basophils Relative: 0 %
Eosinophils Absolute: 0 10*3/uL (ref 0.0–0.5)
Eosinophils Relative: 0 %
HCT: 34.7 % — ABNORMAL LOW (ref 36.0–46.0)
Hemoglobin: 11.4 g/dL — ABNORMAL LOW (ref 12.0–15.0)
Immature Granulocytes: 1 %
Lymphocytes Relative: 5 %
Lymphs Abs: 1 10*3/uL (ref 0.7–4.0)
MCH: 31 pg (ref 26.0–34.0)
MCHC: 32.9 g/dL (ref 30.0–36.0)
MCV: 94.3 fL (ref 80.0–100.0)
Monocytes Absolute: 2 10*3/uL — ABNORMAL HIGH (ref 0.1–1.0)
Monocytes Relative: 10 %
Neutro Abs: 17 10*3/uL — ABNORMAL HIGH (ref 1.7–7.7)
Neutrophils Relative %: 84 %
Platelets: 95 10*3/uL — ABNORMAL LOW (ref 150–400)
RBC: 3.68 MIL/uL — ABNORMAL LOW (ref 3.87–5.11)
RDW: 16.1 % — ABNORMAL HIGH (ref 11.5–15.5)
WBC: 20.2 10*3/uL — ABNORMAL HIGH (ref 4.0–10.5)
nRBC: 0 % (ref 0.0–0.2)

## 2020-01-06 LAB — CBG MONITORING, ED
Glucose-Capillary: 149 mg/dL — ABNORMAL HIGH (ref 70–99)
Glucose-Capillary: 231 mg/dL — ABNORMAL HIGH (ref 70–99)

## 2020-01-06 LAB — SEDIMENTATION RATE: Sed Rate: 42 mm/hr — ABNORMAL HIGH (ref 0–22)

## 2020-01-06 LAB — C-REACTIVE PROTEIN: CRP: 20.3 mg/dL — ABNORMAL HIGH (ref <1.0)

## 2020-01-06 LAB — GLUCOSE, CAPILLARY
Glucose-Capillary: 114 mg/dL — ABNORMAL HIGH (ref 70–99)
Glucose-Capillary: 255 mg/dL — ABNORMAL HIGH (ref 70–99)

## 2020-01-06 MED ORDER — ALTEPLASE 2 MG IJ SOLR: 2.0000 mg | Freq: Once | INTRAMUSCULAR | Status: DC | PRN

## 2020-01-06 MED ORDER — LIDOCAINE HCL (PF) 1 % IJ SOLN: 5.0000 mL | INTRAMUSCULAR | Status: DC | PRN

## 2020-01-06 MED ORDER — PENTAFLUOROPROP-TETRAFLUOROETH EX AERO: 1.0000 "application " | INHALATION_SPRAY | CUTANEOUS | Status: DC | PRN

## 2020-01-06 MED ORDER — SODIUM CHLORIDE 0.9 % IV SOLN: 100.0000 mL | INTRAVENOUS | Status: DC | PRN

## 2020-01-06 MED ORDER — SODIUM ZIRCONIUM CYCLOSILICATE 10 G PO PACK
10.0000 g | PACK | Freq: Once | ORAL | Status: AC
  Administered 2020-01-06: 10 g via ORAL
  Filled 2020-01-06: qty 1

## 2020-01-06 MED ORDER — HEPARIN SODIUM (PORCINE) 1000 UNIT/ML DIALYSIS
1000.0000 [IU] | INTRAMUSCULAR | Status: DC | PRN
  Filled 2020-01-06: qty 1

## 2020-01-06 MED ORDER — HEPARIN SODIUM (PORCINE) 5000 UNIT/ML IJ SOLN
5000.0000 [IU] | Freq: Three times a day (TID) | INTRAMUSCULAR | Status: DC
  Administered 2020-01-06 – 2020-01-17 (×29): 5000 [IU] via SUBCUTANEOUS
  Filled 2020-01-06 (×27): qty 1

## 2020-01-06 MED ORDER — LIDOCAINE-PRILOCAINE 2.5-2.5 % EX CREA
1.0000 "application " | TOPICAL_CREAM | CUTANEOUS | Status: DC | PRN
  Filled 2020-01-06: qty 5

## 2020-01-06 MED ORDER — CALCIUM GLUCONATE-NACL 1-0.675 GM/50ML-% IV SOLN
1.0000 g | Freq: Once | INTRAVENOUS | Status: AC
  Administered 2020-01-06: 1000 mg via INTRAVENOUS
  Filled 2020-01-06: qty 50

## 2020-01-06 MED ORDER — CHLORHEXIDINE GLUCONATE CLOTH 2 % EX PADS
6.0000 | MEDICATED_PAD | Freq: Every day | CUTANEOUS | Status: DC
  Administered 2020-01-07 – 2020-01-17 (×11): 6 via TOPICAL

## 2020-01-06 NOTE — Consult Note (Signed)
ESRD Consult Note  Requesting provider: Irene Pap Reason for consult: ESRD, provision of dialysis Indication for acute dialysis?: End Stage Renal Disease  Outpatient dialysis unit: [facility] Outpatient dialysis schedule: MWF  Assessment/Recommendations: [name] is a/an 79 y.o. female with a past medical history notable for ESRD on HD admitted with right foot cellulitis.   # ESRD: Based on previous records does 3.5 hours of dialysis, will obtain outpatient records, will utilize 2K bath here K 5.8, will proceed with dialysis today  # Volume/ hypertension: not sure what edw is, outpatient records pending, will aim for 2L UF today  # Anemia of Chronic Kidney Disease: Hemoglobin 11.4 and at goal  # Secondary Hyperparathyroidism/Hyperphosphatemia: continue with renvela   # Vascular access: rue avf with good thrill and bruit  # Rt foot cellulitis: mgmt per primary team, on rocephin. No evidence of osteomyelitis   # Additional recommendations: - Dose all meds for creatinine clearance < 10 ml/min  - Unless absolutely necessary, no MRIs with gadolinium.  - Implement save arm precautions.  Prefer needle sticks in the dorsum of the hands or wrists.  No blood pressure measurements in arm. - If blood transfusion is requested during hemodialysis sessions, please alert [place] prior to the session.  - If a hemodialysis catheter line culture is requested, please alert [place] as only hemodialysis nurses are able to collect those specimens.   Gean Quint, MD [facility]  History of Present Illness: [name] is a/an 79 y.o. female with a past medical history of ESRD, insulin-dependent diabetes, history of CVA, hypertension who presents with right foot cellulitis.  She has been following with a podiatrist for this and has an ulcer on the plantar surface of her right foot.  Her swelling was worsening and has been having redness and warmth over the inner therefore  sent here for further evaluation and management.  Other than her foot bothering her she denies any other complaints.  Last dialysis was on Wednesday.  She has been tolerating dialysis she denies any issues with her right upper extremity AV fistula (i.e., prolonged bleeding, skin changes, etc.).   Medications:  Current Facility-Administered Medications  Medication Dose Route Frequency Provider Last Rate Last Admin  . acetaminophen (TYLENOL) tablet 1,000 mg  1,000 mg Oral Q6H PRN Lenore Cordia, MD       Or  . acetaminophen (TYLENOL) suppository 650 mg  650 mg Rectal Q6H PRN Zada Finders R, MD      . aspirin chewable tablet 162 mg  162 mg Oral Daily Lenore Cordia, MD   162 mg at 01/06/20 1008  . Chlorhexidine Gluconate Cloth 2 % PADS 6 each  6 each Topical Q0600 Gean Quint, MD      . gabapentin (NEURONTIN) capsule 300 mg  300 mg Oral Daily Zada Finders R, MD   300 mg at 01/06/20 1008  . heparin injection 5,000 Units  5,000 Units Subcutaneous Q8H Hall, Carole N, DO      . hydrALAZINE (APRESOLINE) tablet 25 mg  25 mg Oral Q6H PRN Zada Finders R, MD      . insulin aspart (novoLOG) injection 0-6 Units  0-6 Units Subcutaneous TID WC Patel, Vishal R, MD      . insulin NPH Human (NOVOLIN N) injection 14 Units  14 Units Subcutaneous BID AC Patel, Vishal R, MD      . labetalol (NORMODYNE) tablet 300 mg  300 mg Oral BID Lenore Cordia, MD  300 mg at 01/06/20 1008  . multivitamin (RENA-VIT) tablet 1 tablet  1 tablet Oral QHS Lenore Cordia, MD   1 tablet at 01/05/20 2153  . ondansetron (ZOFRAN) tablet 4 mg  4 mg Oral Q6H PRN Lenore Cordia, MD       Or  . ondansetron (ZOFRAN) injection 4 mg  4 mg Intravenous Q6H PRN Zada Finders R, MD      . oxyCODONE (Oxy IR/ROXICODONE) immediate release tablet 10 mg  10 mg Oral Q4H PRN Lenore Cordia, MD   10 mg at 01/05/20 2105   Current Outpatient Medications  Medication Sig Dispense Refill  . albuterol (PROVENTIL HFA;VENTOLIN HFA) 108 (90 Base) MCG/ACT  inhaler Inhale 2 puffs into the lungs every 6 (six) hours as needed for wheezing or shortness of breath. 1 Inhaler 2  . aspirin 81 MG chewable tablet Chew 2 tablets (162 mg total) by mouth daily.    . BD INSULIN SYRINGE U/F 31G X 5/16" 0.3 ML MISC 2 (two) times daily. as directed    . cyanocobalamin (,VITAMIN B-12,) 1000 MCG/ML injection Inject 1 mL into the muscle every 30 (thirty) days.  11  . gabapentin (NEURONTIN) 300 MG capsule Take 300 mg by mouth daily.     Marland Kitchen ibuprofen (ADVIL,MOTRIN) 200 MG tablet Take 400 mg by mouth every 6 (six) hours as needed for headache or moderate pain.     Marland Kitchen insulin NPH Human (HUMULIN N,NOVOLIN N) 100 UNIT/ML injection Inject 14 Units into the skin 2 (two) times daily before a meal.     . labetalol (NORMODYNE) 300 MG tablet Take 300 mg by mouth 2 (two) times daily.  3  . levETIRAcetam (KEPPRA) 250 MG tablet Take 250 mg by mouth 2 (two) times daily.     Marland Kitchen lidocaine-prilocaine (EMLA) cream Apply 1 application topically every Monday, Wednesday, and Friday.     . multivitamin (RENA-VIT) TABS tablet Take 1 tablet by mouth at bedtime. 30 tablet 0  . sevelamer carbonate (RENVELA) 800 MG tablet Take 800 mg by mouth 3 (three) times daily with meals.   11     ALLERGIES Ambien [zolpidem] and Reglan [metoclopramide]  MEDICAL HISTORY Past Medical History:  Diagnosis Date  . Anemia   . Cataract   . Chronic kidney disease   . CVA (cerebral infarction)   . Diabetes mellitus    Type 2  . Diabetes mellitus without complication ([place])   . Dialysis patient ([place])   . Dialysis patient ([place])    M, W, F  . Fistula    R arm  . GERD (gastroesophageal reflux disease)   . Hypertension   . Renal disorder   . Shortness of breath   . Stroke [facility])    right side weakness     SOCIAL HISTORY Social History   Socioeconomic History  . Marital status: Widowed    Spouse name: Not on file  . Number of children: Not on file  . Years of education: Not on file  . Highest  education level: Not on file  Occupational History  . Not on file  Tobacco Use  . Smoking status: Never Smoker  . Smokeless tobacco: Never Used  Vaping Use  . Vaping Use: Never used  Substance and Sexual Activity  . Alcohol use: No  . Drug use: No  . Sexual activity: Not on file  Other Topics Concern  . Not on file  Social History Narrative   ** Merged History Encounter **  Social Determinants of Health   Financial Resource Strain:   . Difficulty of Paying Living Expenses:   Food Insecurity:   . Worried About Charity fundraiser in the Last Year:   . Arboriculturist in the Last Year:   Transportation Needs:   . Film/video editor (Medical):   Marland Kitchen Lack of Transportation (Non-Medical):   Physical Activity:   . Days of Exercise per Week:   . Minutes of Exercise per Session:   Stress:   . Feeling of Stress :   Social Connections:   . Frequency of Communication with Friends and Family:   . Frequency of Social Gatherings with Friends and Family:   . Attends Religious Services:   . Active Member of Clubs or Organizations:   . Attends Archivist Meetings:   Marland Kitchen Marital Status:   Intimate Partner Violence:   . Fear of Current or Ex-Partner:   . Emotionally Abused:   Marland Kitchen Physically Abused:   . Sexually Abused:      FAMILY HISTORY Family History  Problem Relation Age of Onset  . Cancer Sister   . Cancer Brother   . Anesthesia problems Neg Hx   . Hypotension Neg Hx   . Malignant hyperthermia Neg Hx   . Pseudochol deficiency Neg Hx      Review of Systems: 12 systems were reviewed and negative except per HPI  Physical Exam: Vitals:   01/06/20 1008 01/06/20 1100  BP: (!) 137/53 (!) 121/52  Pulse: 80 74  Resp:  17  Temp:    SpO2:  92%   Total I/O In: -  Out: 1 [Stool:1]  Intake/Output Summary (Last 24 hours) at 01/06/2020 1155 Last data filed at 01/06/2020 1000 Gross per 24 hour  Intake --  Output 1 ml  Net -1 ml   General: well-appearing,  no acute distress HEENT: anicteric sclera, MMM CV: normal rate, no murmurs, no edema Lungs: bilateral chest rise, normal wob Abd: soft, non-tender, non-distended Skin: no visible lesions or rashes Musculoskeletal: Redness and swelling of right foot (with warmth), ulcer plantar surface of right foot; right upper extremity AV fistula with good bruit and thrill Psych: alert, engaged, appropriate mood and affect Neuro: normal speech, no gross focal deficits   Test Results Reviewed Lab Results  Component Value Date   NA 136 01/06/2020   K 5.8 (H) 01/06/2020   CL 99 01/06/2020   CO2 24 01/06/2020   BUN 37 (H) 01/06/2020   CREATININE 7.65 (H) 01/06/2020   CALCIUM 8.4 (L) 01/06/2020   ALBUMIN 2.8 (L) 01/06/2020   PHOS 4.6 01/06/2020    I have reviewed relevant outside healthcare records

## 2020-01-06 NOTE — Progress Notes (Addendum)
PROGRESS NOTE  [name] DXI:[id number] DOB: 05/11/1941 DOA: 01/05/2020 PCP: [name], MD  HPI/Recap of past 24 hours: [name] is a 79 y.o. female with medical history significant for ESRD on HD MWF, IDT2DM, history of CVA, HTN, who presents to [facility] ED for evaluation of right foot cellulitis.  Follows with her podiatrist regularly for management of an ulcer on the plantar surface of the right foot at the base of the first MTP.  She had been wearing a boot for several weeks and noticed some swelling in her right leg since then.  She went to see her podiatrist on 01/05/2020 who felt she was developing cellulitis of the right foot.  She was subsequently sent to the ED for further evaluation and management.  Patient otherwise denies any subjective fevers, chills, diaphoresis, chest pain, dyspnea, abdominal pain, nausea, vomiting.  She states that she does still make some urine without dysuria.  ED Course:  Temp 99.9 Fahrenheit, SPO2 98% on room air.  Labs are notable for WBC 23.6K, hemoglobin 12.4, platelets 83,000 (171,000 previously on 05/15/2019), lactic acid 1.4, sodium 136, potassium 4.6, bicarb 27, BUN 26, creatinine 6.66, LFTs within normal limits.  SARS-CoV-2 PCR negative.  Blood cultures x2 negative to date.  Right foot x-ray shows soft tissue swelling with ulcer/wound along plantar aspect of the foot at the level of the first MTP.  No definite acute osseous abnormality seen.  Portable chest x-ray shows loop recorder in place.  No focal consolidation, edema, or effusion noted.  Started on empiric IV antibiotic Rocephin.  IV vancomycin and IV Zosyn added on 01/06/20. Rocephin changed to Zosyn.  01/06/20: Seen and examined in the ED.  Reports significant pain that has improved with current pain management.  Denies any cardiopulmonary or GI symptoms.   Assessment/Plan: Principal Problem:   Cellulitis of right foot Active Problems:   Diabetes mellitus, type 2  (HCC)   Leukocytosis   Hypertension associated with diabetes ([place])   ESRD (end stage renal disease) on dialysis ([place])   History of CVA (cerebrovascular accident)   Thrombocytopenia ([place])   Diabetic ulcer of right foot associated with diabetes mellitus due to underlying condition ([place])  Sepsis secondary to cellulitis of right foot associated with diabetic ulcer, rule out osteomyelitis Presented with leukocytosis, tachycardia, tachypnea, cellulitis distal dorsal right foot.  Has plantar diabetic ulcer at base of first MTP without obvious drainage.  Right foot x-ray without evidence of osteomyelitis.  She does have trace unilateral right lower extremity edema up to the knee. Ordered ESR and CRP, follow results CRP significantly elevated greater than 20. Leukocytosis, persisting, WBC 20 K Switch IV antibiotics, stop IV Rocephin and start IV Zosyn and IV vancomycin Pain control Obtain MRI R foot, Xray did not show any bony lesion. Continue local wound care with recommendations provided by wound care specialist. No evidence of DVT on Doppler ultrasound Continue to monitor WBC and fever curve  Acute hyperkalemia in the setting of ESRD Serum potassium 5.8 Treated with IV calcium gluconate 1 g x1 and 10 g of Lokelma x1 dose Hopefully will resume her hemodialysis schedule today  Bilateral lower extremity edema, mildly volume overload on exam 1+ pitting edema in lower extremities bilaterally No evidence of DVT Advised to elevate her legs at rest  ESRD on HD MWF: Nephrology consulted, Dr. Johnney Ou, appreciate assistance.    Thrombocytopenia: Suspect reactive due to acute infection. No evidence of bleeding Plt 95K We will start pharmacological DVT prophylaxis Heparin  subcu 3 times daily   Insulin-dependent type 2 diabetes, well controlled: A1C 7.2 on 01/05/20 Hold off on Novolin for now to avoid hypoglycemia Continue insulin sliding scale, sensitive.  Hypertension: Blood pressure is  at goal Continue home labetalol.  Hydralazine as needed.  History of CVA: Continue home aspirin, monitor platelet counts as above.  Not currently on statin therapy.  DVT prophylaxis:  Subcu heparin 3 times daily Code Status: Full code, confirmed with patient Family Communication:  None at bedside.  Consults called: None   Status is: Observation    Dispo: The patient is from: Home.               Anticipated d/c is to: Home.               Anticipated d/c date is: 01/09/2020              Patient currently Not medically stable.  Ongoing work-up for possible osteomyelitis, ongoing treatment with IV antibiotics for sepsis secondary to cellulitis.        Objective: Vitals:   01/06/20 0230 01/06/20 0400 01/06/20 0630 01/06/20 1008  BP: (!) 112/48 (!) 123/53 (!) 123/53 (!) 137/53  Pulse: 73 74 72 80  Resp: _0 Temp:      TempSrc:      SpO2: 95% 96% 96%   Weight:      Height:       No intake or output data in the 24 hours ending 01/06/20 1037 Filed Weights   01/05/20 1523  Weight: 81.6 kg    Exam:  . General: 79 y.o. year-old female well developed well nourished in no acute distress.  Alert and oriented x3. . Cardiovascular: Regular rate and rhythm with no rubs or gallops.  No thyromegaly or JVD noted.   Marland Kitchen Respiratory: Clear to auscultation with no wheezes or rales. Good inspiratory effort. . Abdomen: Soft nontender nondistended with normal bowel sounds x4 quadrants. . Musculoskeletal: 1+ pitting edema in lower extremity bilaterally. R foot dorsal portion erythematous, edematous, tender and warm to the touch. Ulcerative lesion in the plantar region as described above. Marland Kitchen Psychiatry: Mood is appropriate for condition and setting   Data Reviewed: CBC: Recent Labs  Lab 01/05/20 1535 01/06/20 0906  WBC 23.6* 20.2*  NEUTROABS 20.4* 17.0*  HGB 12.4 11.4*  HCT 37.7 34.7*  MCV 95.4 94.3  PLT 83* 95*   Basic Metabolic Panel: Recent Labs  Lab 01/05/20 1535  01/06/20 0411  NA 136 136  K 4.6 5.8*  CL 97* 99  CO2 27 24  GLUCOSE 144* 127*  BUN 26* 37*  CREATININE 6.66* 7.65*  CALCIUM 9.0 8.4*  PHOS  --  4.6   GFR: Estimated Creatinine Clearance: 6.6 mL/min (A) (by C-G formula based on SCr of 7.65 mg/dL (H)). Liver Function Tests: Recent Labs  Lab 01/05/20 1535 01/06/20 0411  AST 19  --   ALT 18  --   ALKPHOS 78  --   BILITOT 1.2  --   PROT 7.8  --   ALBUMIN 3.4* 2.8*   No results for input(s): LIPASE, AMYLASE in the last 168 hours. No results for input(s): AMMONIA in the last 168 hours. Coagulation Profile: Recent Labs  Lab 01/05/20 1830  INR 1.4*   Cardiac Enzymes: No results for input(s): CKTOTAL, CKMB, CKMBINDEX, TROPONINI in the last 168 hours. BNP (last 3 results) No results for input(s): PROBNP in the last 8760 hours. HbA1C: Recent Labs  01/05/20 1535  HGBA1C 7.7*   CBG: Recent Labs  Lab 01/05/20 1532 01/05/20 2157 01/06/20 0820  GLUCAP 141* 148* 149*   Lipid Profile: No results for input(s): CHOL, HDL, LDLCALC, TRIG, CHOLHDL, LDLDIRECT in the last 72 hours. Thyroid Function Tests: No results for input(s): TSH, T4TOTAL, FREET4, T3FREE, THYROIDAB in the last 72 hours. Anemia Panel: No results for input(s): VITAMINB12, FOLATE, FERRITIN, TIBC, IRON, RETICCTPCT in the last 72 hours. Urine analysis:    Component Value Date/Time   COLORURINE YELLOW 10/15/2017 2244   APPEARANCEUR HAZY (A) 10/15/2017 2244   LABSPEC 1.015 10/15/2017 2244   PHURINE 7.0 10/15/2017 2244   GLUCOSEU >=500 (A) 10/15/2017 2244   HGBUR SMALL (A) 10/15/2017 2244   BILIRUBINUR NEGATIVE 10/15/2017 2244   KETONESUR NEGATIVE 10/15/2017 2244   PROTEINUR 100 (A) 10/15/2017 2244   UROBILINOGEN 0.2 11/19/2014 0909   NITRITE NEGATIVE 10/15/2017 2244   LEUKOCYTESUR MODERATE (A) 10/15/2017 2244   Sepsis Labs: _0 (procalcitonin:4,lacticidven:4)  ) Recent Results (from the past 240 hour(s))  Blood Culture (routine x 2)      Status: None (Preliminary result)   Collection Time: 01/05/20  6:30 PM   Specimen: BLOOD  Result Value Ref Range Status   Specimen Description BLOOD RIGHT ANTECUBITAL  Final   Special Requests   Final    BOTTLES DRAWN AEROBIC AND ANAEROBIC Blood Culture adequate volume   Culture   Final    NO GROWTH < 12 HOURS Performed at [facility], [place] [address] [place], [place] [address]    Report Status PENDING  Incomplete  SARS Coronavirus 2 by RT PCR (hospital order, performed in [facility] hospital lab) Nasopharyngeal Nasopharyngeal Swab     Status: None   Collection Time: 01/05/20  6:30 PM   Specimen: Nasopharyngeal Swab  Result Value Ref Range Status   SARS Coronavirus 2 NEGATIVE NEGATIVE Final    Comment: (NOTE) SARS-CoV-2 target nucleic acids are NOT DETECTED.  The SARS-CoV-2 RNA is generally detectable in upper and lower respiratory specimens during the acute phase of infection. The lowest concentration of SARS-CoV-2 viral copies this assay can detect is 250 copies / mL. A negative result does not preclude SARS-CoV-2 infection and should not be used as the sole basis for treatment or other patient management decisions.  A negative result may occur with improper specimen collection / handling, submission of specimen other than nasopharyngeal swab, presence of viral mutation(s) within the areas targeted by this assay, and inadequate number of viral copies (<250 copies / mL). A negative result must be combined with clinical observations, patient history, and epidemiological information.  Fact Sheet for Patients:   StrictlyIdeas.no  Fact Sheet for Healthcare Providers: BankingDealers.co.za  This test is not yet approved or  cleared by the [place] FDA and has been authorized for detection and/or diagnosis of SARS-CoV-2 by FDA under an Emergency Use Authorization (EUA).  This EUA will remain in effect (meaning this test can  be used) for the duration of the COVID-19 declaration under Section 564(b)(1) of the Act, 21 U.S.C. section 360bbb-3(b)(1), unless the authorization is terminated or revoked sooner.  Performed at [facility], [place] [address] [place], [place] [address]   Blood Culture (routine x 2)     Status: None (Preliminary result)   Collection Time: 01/05/20  7:04 PM   Specimen: BLOOD LEFT HAND  Result Value Ref Range Status   Specimen Description BLOOD LEFT HAND  Final   Special Requests   Final  BOTTLES DRAWN AEROBIC AND ANAEROBIC Blood Culture results may not be optimal due to an inadequate volume of blood received in culture bottles   Culture   Final    NO GROWTH < 12 HOURS Performed at [place] [address], [place], [place] [address]    Report Status PENDING  Incomplete      Studies: DG Chest Port 1 View  Result Date: 01/05/2020 CLINICAL DATA:  Sepsis, wound infection EXAM: PORTABLE CHEST 1 VIEW COMPARISON:  Radiograph 02/19/2018 FINDINGS: Stable mild hyperinflation with some coarsened interstitial changes. No consolidation, features of edema, pneumothorax, or effusion. Cardiomediastinal contours are unremarkable for the portable technique. Pulmonary vascularity normally distributed. Loop recorder projects over the left heart border. Vascular stent noted in the right axillary region. Telemetry leads overlie the chest. No acute osseous or soft tissue abnormality. Degenerative changes are present in the imaged spine and shoulders. IMPRESSION: 1. No acute cardiopulmonary disease. 2. Stable mild hyperinflation and coarsened interstitial changes. Electronically Signed   By: Lovena Le M.D.   On: 01/05/2020 18:57   DG Foot Complete Right  Result Date: 01/05/2020 CLINICAL DATA:  Wound infection diabetic patient EXAM: RIGHT FOOT COMPLETE - 3+ VIEW COMPARISON:  10/13/2019 FINDINGS: Bones appear demineralized. There are vascular calcifications. There is prominent soft tissue  swelling. Ulcer or wound along the plantar aspect of the foot at the level of the first MTP. No definite periostitis or cortical bone destruction in the region. Moderate plantar calcaneal spur. IMPRESSION: Soft tissue swelling with ulcer or wound along the plantar aspect of the foot at the level of the first MTP. No definite acute osseous abnormality is seen. Electronically Signed   By: Donavan Foil M.D.   On: 01/05/2020 15:59   VAS [place] LOWER EXTREMITY VENOUS (DVT)  Result Date: 01/06/2020  Lower Venous DVTStudy Indications: Edema.  Comparison Study: no prior Performing Technologist: Abram Sander RVS  Examination Guidelines: A complete evaluation includes B-mode imaging, spectral Doppler, color Doppler, and power Doppler as needed of all accessible portions of each vessel. Bilateral testing is considered an integral part of a complete examination. Limited examinations for reoccurring indications may be performed as noted. The reflux portion of the exam is performed with the patient in reverse Trendelenburg.  +---------+---------------+---------+-----------+----------+--------------+ RIGHT    CompressibilityPhasicitySpontaneityPropertiesThrombus Aging +---------+---------------+---------+-----------+----------+--------------+ CFV      Full           Yes      Yes                                 +---------+---------------+---------+-----------+----------+--------------+ SFJ      Full                                                        +---------+---------------+---------+-----------+----------+--------------+ FV Prox  Full                                                        +---------+---------------+---------+-----------+----------+--------------+ FV Mid   Full                                                        +---------+---------------+---------+-----------+----------+--------------+  FV DistalFull                                                         +---------+---------------+---------+-----------+----------+--------------+ PFV      Full                                                        +---------+---------------+---------+-----------+----------+--------------+ POP      Full           Yes      Yes                                 +---------+---------------+---------+-----------+----------+--------------+ PTV      Full                                                        +---------+---------------+---------+-----------+----------+--------------+ PERO     Full                                                        +---------+---------------+---------+-----------+----------+--------------+   +----+---------------+---------+-----------+----------+--------------+ LEFTCompressibilityPhasicitySpontaneityPropertiesThrombus Aging +----+---------------+---------+-----------+----------+--------------+ CFV Full           Yes      Yes                                 +----+---------------+---------+-----------+----------+--------------+     Summary: RIGHT: - There is no evidence of deep vein thrombosis in the lower extremity.  - No cystic structure found in the popliteal fossa.  LEFT: - No evidence of common femoral vein obstruction.  *See table(s) above for measurements and observations.    Preliminary     Scheduled Meds: . aspirin  162 mg Oral Daily  . gabapentin  300 mg Oral Daily  . insulin aspart  0-6 Units Subcutaneous TID WC  . insulin NPH Human  14 Units Subcutaneous BID AC  . labetalol  300 mg Oral BID  . multivitamin  1 tablet Oral QHS    Continuous Infusions: . cefTRIAXone (ROCEPHIN)  IV Stopped (01/06/20 0907)     LOS: 0 days     Kayleen Memos, MD [facility] Pager [phone]  If 7PM-7AM, please contact night-coverage [email or web address] Password [facility] 01/06/2020, 10:37 AM

## 2020-01-06 NOTE — ED Notes (Signed)
Pt transported to MRI 

## 2020-01-06 NOTE — Consult Note (Signed)
[facility] Nurse Consult Note: Reason for Consult: Consult requested for right foot wound.  Performed remotely after review of progress notes and photos in the EMR. X-ray did not indicate osteomyelitis.  Wound type: Right plantar foot with chronic full thickness wound yellow and surrounded by dry callous.  Pt was followed by podiatry prior to admission.  Dressing procedure/placement/frequency: Topical treatment orders provided for bedside nurses to perform daily as follows to provide antimicrobial benefits and absorb drainage; Apply Aquacel Kellie Simmering # [phone]) to foot wound Q day, then cover with gauze and kerlex.  Moisten with NS to remove previous dressing each time.  Please re-consult if further assistance is needed.  Thank-you,  Julien Girt MSN, [place], [place], [place], [place]

## 2020-01-06 NOTE — ED Notes (Signed)
Pt returned from MRI °

## 2020-01-06 NOTE — Progress Notes (Signed)
Lower extremity venous has been completed.   Preliminary results in CV Proc.   Abram Sander 01/06/2020 9:24 AM

## 2020-01-06 NOTE — ED Notes (Signed)
MS Breakfast Ordered 

## 2020-01-07 DIAGNOSIS — L03115 Cellulitis of right lower limb: Secondary | ICD-10-CM | POA: Diagnosis not present

## 2020-01-07 LAB — CBC WITH DIFFERENTIAL/PLATELET
Abs Immature Granulocytes: 0.08 10*3/uL — ABNORMAL HIGH (ref 0.00–0.07)
Basophils Absolute: 0 10*3/uL (ref 0.0–0.1)
Basophils Relative: 0 %
Eosinophils Absolute: 0.1 10*3/uL (ref 0.0–0.5)
Eosinophils Relative: 0 %
HCT: 32.8 % — ABNORMAL LOW (ref 36.0–46.0)
Hemoglobin: 11.1 g/dL — ABNORMAL LOW (ref 12.0–15.0)
Immature Granulocytes: 1 %
Lymphocytes Relative: 6 %
Lymphs Abs: 1 10*3/uL (ref 0.7–4.0)
MCH: 30.7 pg (ref 26.0–34.0)
MCHC: 33.8 g/dL (ref 30.0–36.0)
MCV: 90.6 fL (ref 80.0–100.0)
Monocytes Absolute: 1.8 10*3/uL — ABNORMAL HIGH (ref 0.1–1.0)
Monocytes Relative: 12 %
Neutro Abs: 12.4 10*3/uL — ABNORMAL HIGH (ref 1.7–7.7)
Neutrophils Relative %: 81 %
Platelets: 98 10*3/uL — ABNORMAL LOW (ref 150–400)
RBC: 3.62 MIL/uL — ABNORMAL LOW (ref 3.87–5.11)
RDW: 15.4 % (ref 11.5–15.5)
WBC: 15.4 10*3/uL — ABNORMAL HIGH (ref 4.0–10.5)
nRBC: 0 % (ref 0.0–0.2)

## 2020-01-07 LAB — GLUCOSE, CAPILLARY
Glucose-Capillary: 177 mg/dL — ABNORMAL HIGH (ref 70–99)
Glucose-Capillary: 115 mg/dL — ABNORMAL HIGH (ref 70–99)
Glucose-Capillary: 191 mg/dL — ABNORMAL HIGH (ref 70–99)
Glucose-Capillary: 239 mg/dL — ABNORMAL HIGH (ref 70–99)

## 2020-01-07 LAB — BASIC METABOLIC PANEL
Anion gap: 11 (ref 5–15)
BUN: 28 mg/dL — ABNORMAL HIGH (ref 8–23)
CO2: 28 mmol/L (ref 22–32)
Calcium: 8.3 mg/dL — ABNORMAL LOW (ref 8.9–10.3)
Chloride: 96 mmol/L — ABNORMAL LOW (ref 98–111)
Creatinine, Ser: 5.77 mg/dL — ABNORMAL HIGH (ref 0.44–1.00)
GFR calc Af Amer: 7 mL/min — ABNORMAL LOW (ref >60)
GFR calc non Af Amer: 6 mL/min — ABNORMAL LOW (ref >60)
Glucose, Bld: 198 mg/dL — ABNORMAL HIGH (ref 70–99)
Potassium: 4.3 mmol/L (ref 3.5–5.1)
Sodium: 135 mmol/L (ref 135–145)

## 2020-01-07 MED ORDER — ALBUTEROL SULFATE HFA 108 (90 BASE) MCG/ACT IN AERS: 2.0000 | INHALATION_SPRAY | Freq: Four times a day (QID) | RESPIRATORY_TRACT | Status: DC | PRN

## 2020-01-07 MED ORDER — ALBUTEROL SULFATE (2.5 MG/3ML) 0.083% IN NEBU: 2.5000 mg | INHALATION_SOLUTION | Freq: Four times a day (QID) | RESPIRATORY_TRACT | Status: DC | PRN

## 2020-01-07 NOTE — Progress Notes (Signed)
[facility] Progress Note    Assessment/ Plan:   1. ESRD on hemodialysis Monday Wednesday Friday ([facility]) -No indication for dialysis today we will plan for dialysis on Monday 7/26 -Records requested from outpatient facility -Per patient, does 4 hours of dialysis -Right arm precautions -Renal diet -Avoid nephrotoxic medications including NSAIDs and iodinated intravenous contrast exposure unless the latter is absolutely indicated.  Preferred narcotic agents for pain control are hydromorphone, fentanyl, and methadone. Morphine should not be used. Avoid Baclofen and avoid oral sodium phosphate and magnesium citrate based laxatives / bowel preps. Continue strict Input and Output monitoring. Will monitor the patient closely with you and intervene or adjust therapy as indicated by changes in clinical status/labs  2. Anemia of chronic kidney disease -hgb 11.1 and at goal 3. Secondary hyperparathyroidism -phos ok, can check PTH 4. Sepsis secondary to cellulitis of right foot associate with diabetic ulcer -Osteomyelitis ruled out -On Zosyn and vancomycin; renally dosed Management per primary service 5. HTN, bp at goal -labetalol -consider norvasc if BP control is needed   Barbara Quint, MD [place] 01/07/2020, 2:04 PM   Subjective:   Still has some right foot pain.  Tolerated dialysis yesterday with no issues.  No other complaints   Objective:   BP (!) 123/51 (BP Location: Left Arm)   Pulse 78   Temp 99.4 F (37.4 C) (Oral)   Resp 18   Ht 5\' 8"  (1.727 m)   Wt 81.6 kg   SpO2 95%   BMI 27.35 kg/m   Intake/Output Summary (Last 24 hours) at 01/07/2020 1404 Last data filed at 01/06/2020 1609 Gross per 24 hour  Intake --  Output 3000 ml  Net -3000 ml   Weight change: -0.047 kg  Physical Exam: Gen: No acute distress, nontoxic appearing CVS: Regular rate, S1-S2 Resp: Clear to auscultation bilaterally, normal work of breathing, no  wheezes/rhonchi/rales/crackles, bilateral chest expansion Abd: Soft, nontender/nondistended Ext: Right upper extremity AVF with good bruit and thrill, right foot with erythema and warmth Neuro: No focal deficits, aaox3   Imaging: MR FOOT RIGHT WO CONTRAST  Result Date: 01/06/2020 CLINICAL DATA:  Right foot cellulitis.  Diabetes. EXAM: MRI OF THE RIGHT FOREFOOT WITHOUT CONTRAST TECHNIQUE: Multiplanar, multisequence MR imaging of the right forefoot was performed. No intravenous contrast was administered. COMPARISON:  None. FINDINGS: Bones/Joint/Cartilage No findings of osteomyelitis. Incidental bifid medial sesamoid. No significant abnormal signal in the sesamoids. Ligaments Lisfranc ligament intact. Muscles and Tendons Diffuse regional muscular edema, potentially neurogenic or from low-grade myositis. Soft tissues Plantar ulceration below the first MTP joint. No drainable abscess. Notable subcutaneous edema especially along the dorsum of the foot. This edema tracks into the toes. IMPRESSION: 1. Considerable subcutaneous edema in the forefoot especially along the dorsum of the foot, suggesting cellulitis. No abscess identified. 2. No findings of osteomyelitis. 3. Plantar ulceration below the first MTP joint. 4. Diffuse regional muscular edema, potentially neurogenic or from low-grade myositis. Electronically Signed   By: Van Clines M.D.   On: 01/06/2020 12:15   DG Chest Port 1 View  Result Date: 01/05/2020 CLINICAL DATA:  Sepsis, wound infection EXAM: PORTABLE CHEST 1 VIEW COMPARISON:  Radiograph 02/19/2018 FINDINGS: Stable mild hyperinflation with some coarsened interstitial changes. No consolidation, features of edema, pneumothorax, or effusion. Cardiomediastinal contours are unremarkable for the portable technique. Pulmonary vascularity normally distributed. Loop recorder projects over the left heart border. Vascular stent noted in the right axillary region. Telemetry leads overlie the chest.  No acute osseous  or soft tissue abnormality. Degenerative changes are present in the imaged spine and shoulders. IMPRESSION: 1. No acute cardiopulmonary disease. 2. Stable mild hyperinflation and coarsened interstitial changes. Electronically Signed   By: Lovena Le M.D.   On: 01/05/2020 18:57   DG Foot Complete Right  Result Date: 01/05/2020 CLINICAL DATA:  Wound infection diabetic patient EXAM: RIGHT FOOT COMPLETE - 3+ VIEW COMPARISON:  10/13/2019 FINDINGS: Bones appear demineralized. There are vascular calcifications. There is prominent soft tissue swelling. Ulcer or wound along the plantar aspect of the foot at the level of the first MTP. No definite periostitis or cortical bone destruction in the region. Moderate plantar calcaneal spur. IMPRESSION: Soft tissue swelling with ulcer or wound along the plantar aspect of the foot at the level of the first MTP. No definite acute osseous abnormality is seen. Electronically Signed   By: Donavan Foil M.D.   On: 01/05/2020 15:59   VAS [place] LOWER EXTREMITY VENOUS (DVT)  Result Date: 01/06/2020  Lower Venous DVTStudy Indications: Edema.  Comparison Study: no prior Performing Technologist: Abram Sander RVS  Examination Guidelines: A complete evaluation includes B-mode imaging, spectral Doppler, color Doppler, and power Doppler as needed of all accessible portions of each vessel. Bilateral testing is considered an integral part of a complete examination. Limited examinations for reoccurring indications may be performed as noted. The reflux portion of the exam is performed with the patient in reverse Trendelenburg.  +---------+---------------+---------+-----------+----------+--------------+ RIGHT    CompressibilityPhasicitySpontaneityPropertiesThrombus Aging +---------+---------------+---------+-----------+----------+--------------+ CFV      Full           Yes      Yes                                  +---------+---------------+---------+-----------+----------+--------------+ SFJ      Full                                                        +---------+---------------+---------+-----------+----------+--------------+ FV Prox  Full                                                        +---------+---------------+---------+-----------+----------+--------------+ FV Mid   Full                                                        +---------+---------------+---------+-----------+----------+--------------+ FV DistalFull                                                        +---------+---------------+---------+-----------+----------+--------------+ PFV      Full                                                        +---------+---------------+---------+-----------+----------+--------------+  POP      Full           Yes      Yes                                 +---------+---------------+---------+-----------+----------+--------------+ PTV      Full                                                        +---------+---------------+---------+-----------+----------+--------------+ PERO     Full                                                        +---------+---------------+---------+-----------+----------+--------------+   +----+---------------+---------+-----------+----------+--------------+ LEFTCompressibilityPhasicitySpontaneityPropertiesThrombus Aging +----+---------------+---------+-----------+----------+--------------+ CFV Full           Yes      Yes                                 +----+---------------+---------+-----------+----------+--------------+     Summary: RIGHT: - There is no evidence of deep vein thrombosis in the lower extremity.  - No cystic structure found in the popliteal fossa.  LEFT: - No evidence of common femoral vein obstruction.  *See table(s) above for measurements and observations. Electronically signed by Monica Martinez  MD on 01/06/2020 at 12:47:38 PM.    Final     Labs: BMET Recent Labs  Lab 01/05/20 1535 01/06/20 0411 01/06/20 1827 01/07/20 0752  NA 136 136 136 135  K 4.6 5.8* 4.1 4.3  CL 97* 99 94* 96*  CO2 27 24 27 28   GLUCOSE 144* 127* 124* 198*  BUN 26* 37* 17 28*  CREATININE 6.66* 7.65* 4.53* 5.77*  CALCIUM 9.0 8.4* 8.9 8.3*  PHOS  --  4.6 3.2  --    CBC Recent Labs  Lab 01/05/20 1535 01/06/20 0906 01/06/20 1827 01/07/20 0752  WBC 23.6* 20.2* 19.9* 15.4*  NEUTROABS 20.4* 17.0*  --  12.4*  HGB 12.4 11.4* 13.0 11.1*  HCT 37.7 34.7* 38.3 32.8*  MCV 95.4 94.3 92.3 90.6  PLT 83* 95* 98* 98*    Medications:    . aspirin  162 mg Oral Daily  . Chlorhexidine Gluconate Cloth  6 each Topical Q0600  . gabapentin  300 mg Oral Daily  . heparin injection (subcutaneous)  5,000 Units Subcutaneous Q8H  . insulin aspart  0-6 Units Subcutaneous TID WC  . insulin NPH Human  14 Units Subcutaneous BID AC  . labetalol  300 mg Oral BID  . multivitamin  1 tablet Oral QHS

## 2020-01-07 NOTE — Progress Notes (Signed)
PROGRESS NOTE  [name] BTD:[id number] DOB: 07/04/1940 DOA: 01/05/2020 PCP: [name], MD  HPI/Recap of past 24 hours: [name] is a 79 y.o. female with medical history significant for ESRD on HD MWF, IDT2DM, history of CVA, HTN, who presents to [facility] ED for evaluation of right foot cellulitis.  Follows with her podiatrist regularly for management of an ulcer on the plantar surface of the right foot at the base of the first MTP.  She had been wearing a boot for several weeks and noticed some swelling in her right leg since then.  She went to see her podiatrist on 01/05/2020 who felt she was developing cellulitis of the right foot.  She was subsequently sent to the ED for further evaluation and management.  In the ED, Temp 99.9 Fahrenheit.  Labs are notable for WBC 23.6K, hemoglobin 12.4K, platelets 83,000 (171,000 previously on 05/15/2019), lactic acid 1.4, sodium 136, potassium 4.6, bicarb 27, BUN 26, creatinine 6.66.  SARS-CoV-2 PCR negative.  Blood cultures x2 negative to date.  Right foot x-ray shows soft tissue swelling with ulcer/wound along plantar aspect of the foot at the level of the first MTP.  No definite acute osseous abnormality seen.  MRI ruled out osteomyelitis.  Currently on IV vancomycin and IV Zosyn, day2.  01/07/20: Seen and examined with her daughter at her bedside.  Reports R foot pain prior to taking her pain medication.  Assessment/Plan: Principal Problem:   Cellulitis of right foot Active Problems:   Diabetes mellitus, type 2 (HCC)   Leukocytosis   Hypertension associated with diabetes ([place])   ESRD (end stage renal disease) on dialysis ([place])   History of CVA (cerebrovascular accident)   Thrombocytopenia ([place])   Diabetic ulcer of right foot associated with diabetes mellitus due to underlying condition ([place])  Sepsis,improving, secondary to cellulitis of right foot associated with diabetic ulcer, osteomyelitis ruled out. Presented with  leukocytosis, tachycardia, tachypnea, cellulitis distal dorsal right foot.  Has plantar diabetic ulcer at base of first MTP without obvious drainage.  Right foot x-ray without evidence of osteomyelitis.  She does have trace unilateral right lower extremity edema up to the knee. ESR 42  and CRP 20 No evidence of DVT on bilateral Doppler ultrasound, no evidence of osteomyelitis on MRI right foot. Leukocytosis, persisting, WBC 20 K> 15K Continue IV Zosyn and IV vancomycin, day 2 Renally dosed by pharmacy Continue pain control Continue to monitor WBC and fever curve.  Acute hyperkalemia, resolved with treatment and hemodialysis, in the setting of ESRD Serum potassium 5.8> 12.3. Treated on 7/23 with IV calcium gluconate 1 g x1 and 10 g of Lokelma x1 dose Had hemodialysis on 01/06/2020.  Bilateral lower extremity edema, resolved post hemodialysis, secondary to volume overload on exam Presented with 1+ pitting edema in lower extremities bilaterally, has improved after hemodialysis. No evidence of DVT on bilateral Doppler ultrasound Advised to elevate her legs when at rest  ESRD on HD MWF: Seen by Dr. Candiss Norse, appreciate nephrology's assistance. Had hemodialysis on 01/06/2020.  Thrombocytopenia: Suspect reactive due to acute infection. No evidence of bleeding Plt 95K, up trending> 98K.  Insulin-dependent type 2 diabetes, well controlled: A1C 7.2 on 01/05/20 Hold off prior to admission Novolin for now to avoid hypoglycemia Continue insulin sliding scale, sensitive.  Hypertension: Blood pressure is at goal Continue home labetalol.  Hydralazine as needed. Continue to monitor BP  History of CVA: Continue home aspirin, monitor platelet counts as above.  Not currently on statin therapy.  DVT prophylaxis:  Subcu heparin 3 times daily Code Status: Full code, confirmed with patient Family Communication:  Updated her daughter at bedside  Consults called: Nephrology   Status is:  Inpatient    Dispo: The patient is from: Home.               Anticipated d/c is to: Home.               Anticipated d/c date is: 01/09/2020              Patient currently Not medically stable due to persistent leukocytosis and ongoing treatment for sepsis secondary to right foot cellulitis with IV antibiotics.        Objective: Vitals:   01/06/20 2014 01/07/20 0038 01/07/20 0039 01/07/20 0451  BP: (!) 132/51 (!) 124/48 (!) 129/52 (!) 123/51  Pulse: 88 82 83 78  Resp: 18 18  18   Temp: 99.6 F (37.6 C) 99.3 F (37.4 C)  99.4 F (37.4 C)  TempSrc: Oral Oral  Oral  SpO2: 98% 94%  95%  Weight:      Height:        Intake/Output Summary (Last 24 hours) at 01/07/2020 1240 Last data filed at 01/06/2020 1609 Gross per 24 hour  Intake --  Output 3000 ml  Net -3000 ml   Filed Weights   01/05/20 1523 01/06/20 1224  Weight: 81.6 kg 81.6 kg    Exam:  . General: 80 y.o. year-old female well-developed well-nourished in no acute distress.  Alert and interactive.  Hard of hearing.   . Cardiovascular: Regular rate and rhythm no rubs or gallops.   Marland Kitchen Respiratory: Clear to auscultation no wheezes or rales. . Abdomen: Soft nontender normal bowel sounds present. . Musculoskeletal: Trace lower extremity edema bilaterally.  Improved right foot dorsal portion erythema, edema and tenderness with palpation.  Elicited lesion in the plantar region of the right foot under the great toe.  Marland Kitchen Psychiatry: Mood is appropriate for condition and setting.  Data Reviewed: CBC: Recent Labs  Lab 01/05/20 1535 01/06/20 0906 01/06/20 1827 01/07/20 0752  WBC 23.6* 20.2* 19.9* 15.4*  NEUTROABS 20.4* 17.0*  --  12.4*  HGB 12.4 11.4* 13.0 11.1*  HCT 37.7 34.7* 38.3 32.8*  MCV 95.4 94.3 92.3 90.6  PLT 83* 95* 98* 98*   Basic Metabolic Panel: Recent Labs  Lab 01/05/20 1535 01/06/20 0411 01/06/20 1827 01/07/20 0752  NA 136 136 136 135  K 4.6 5.8* 4.1 4.3  CL 97* 99 94* 96*  CO2 27 24 27 28    GLUCOSE 144* 127* 124* 198*  BUN 26* 37* 17 28*  CREATININE 6.66* 7.65* 4.53* 5.77*  CALCIUM 9.0 8.4* 8.9 8.3*  PHOS  --  4.6 3.2  --    GFR: Estimated Creatinine Clearance: 8.7 mL/min (A) (by C-G formula based on SCr of 5.77 mg/dL (H)). Liver Function Tests: Recent Labs  Lab 01/05/20 1535 01/06/20 0411 01/06/20 1827  AST 19  --   --   ALT 18  --   --   ALKPHOS 78  --   --   BILITOT 1.2  --   --   PROT 7.8  --   --   ALBUMIN 3.4* 2.8* 2.9*   No results for input(s): LIPASE, AMYLASE in the last 168 hours. No results for input(s): AMMONIA in the last 168 hours. Coagulation Profile: Recent Labs  Lab 01/05/20 1830  INR 1.4*   Cardiac Enzymes: No results for input(s): CKTOTAL, CKMB, CKMBINDEX,  TROPONINI in the last 168 hours. BNP (last 3 results) No results for input(s): PROBNP in the last 8760 hours. HbA1C: Recent Labs    01/05/20 1535  HGBA1C 7.7*   CBG: Recent Labs  Lab 01/06/20 1212 01/06/20 1801 01/06/20 2010 01/07/20 0736 01/07/20 1146  GLUCAP 231* 114* 255* 177* 115*   Lipid Profile: No results for input(s): CHOL, HDL, LDLCALC, TRIG, CHOLHDL, LDLDIRECT in the last 72 hours. Thyroid Function Tests: No results for input(s): TSH, T4TOTAL, FREET4, T3FREE, THYROIDAB in the last 72 hours. Anemia Panel: No results for input(s): VITAMINB12, FOLATE, FERRITIN, TIBC, IRON, RETICCTPCT in the last 72 hours. Urine analysis:    Component Value Date/Time   COLORURINE YELLOW 10/15/2017 2244   APPEARANCEUR HAZY (A) 10/15/2017 2244   LABSPEC 1.015 10/15/2017 2244   PHURINE 7.0 10/15/2017 2244   GLUCOSEU >=500 (A) 10/15/2017 2244   HGBUR SMALL (A) 10/15/2017 2244   BILIRUBINUR NEGATIVE 10/15/2017 2244   KETONESUR NEGATIVE 10/15/2017 2244   PROTEINUR 100 (A) 10/15/2017 2244   UROBILINOGEN 0.2 11/19/2014 0909   NITRITE NEGATIVE 10/15/2017 2244   LEUKOCYTESUR MODERATE (A) 10/15/2017 2244   Sepsis Labs: @LABRCNTIP (procalcitonin:4,lacticidven:4)  ) Recent Results  (from the past 240 hour(s))  Blood Culture (routine x 2)     Status: None (Preliminary result)   Collection Time: 01/05/20  6:30 PM   Specimen: BLOOD  Result Value Ref Range Status   Specimen Description BLOOD RIGHT ANTECUBITAL  Final   Special Requests   Final    BOTTLES DRAWN AEROBIC AND ANAEROBIC Blood Culture adequate volume   Culture   Final    NO GROWTH 2 DAYS Performed at [facility], [place] [address] [place], [place] [address]    Report Status PENDING  Incomplete  SARS Coronavirus 2 by RT PCR (hospital order, performed in [facility] hospital lab) Nasopharyngeal Nasopharyngeal Swab     Status: None   Collection Time: 01/05/20  6:30 PM   Specimen: Nasopharyngeal Swab  Result Value Ref Range Status   SARS Coronavirus 2 NEGATIVE NEGATIVE Final    Comment: (NOTE) SARS-CoV-2 target nucleic acids are NOT DETECTED.  The SARS-CoV-2 RNA is generally detectable in upper and lower respiratory specimens during the acute phase of infection. The lowest concentration of SARS-CoV-2 viral copies this assay can detect is 250 copies / mL. A negative result does not preclude SARS-CoV-2 infection and should not be used as the sole basis for treatment or other patient management decisions.  A negative result may occur with improper specimen collection / handling, submission of specimen other than nasopharyngeal swab, presence of viral mutation(s) within the areas targeted by this assay, and inadequate number of viral copies (<250 copies / mL). A negative result must be combined with clinical observations, patient history, and epidemiological information.  Fact Sheet for Patients:   StrictlyIdeas.no  Fact Sheet for Healthcare Providers: BankingDealers.co.za  This test is not yet approved or  cleared by the [place] FDA and has been authorized for detection and/or diagnosis of SARS-CoV-2 by FDA under an Emergency Use Authorization  (EUA).  This EUA will remain in effect (meaning this test can be used) for the duration of the COVID-19 declaration under Section 564(b)(1) of the Act, 21 U.S.C. section 360bbb-3(b)(1), unless the authorization is terminated or revoked sooner.  Performed at [facility], [place] [address], [place], [place] [address]   Blood Culture (routine x 2)     Status: None (Preliminary result)   Collection Time: 01/05/20  7:04 PM   Specimen: BLOOD LEFT HAND  Result Value Ref Range Status   Specimen Description BLOOD LEFT HAND  Final   Special Requests   Final    BOTTLES DRAWN AEROBIC AND ANAEROBIC Blood Culture results may not be optimal due to an inadequate volume of blood received in culture bottles   Culture   Final    NO GROWTH 2 DAYS Performed at [place] [address], [place], [place] [address]    Report Status PENDING  Incomplete      Studies: No results found.  Scheduled Meds: . aspirin  162 mg Oral Daily  . Chlorhexidine Gluconate Cloth  6 each Topical Q0600  . gabapentin  300 mg Oral Daily  . heparin injection (subcutaneous)  5,000 Units Subcutaneous Q8H  . insulin aspart  0-6 Units Subcutaneous TID WC  . insulin NPH Human  14 Units Subcutaneous BID AC  . labetalol  300 mg Oral BID  . multivitamin  1 tablet Oral QHS    Continuous Infusions: . sodium chloride    . sodium chloride       LOS: 1 day     Kayleen Memos, MD [facility] Pager [phone]  If 7PM-7AM, please contact night-coverage [email or web address] Password [facility] 01/07/2020, 12:40 PM

## 2020-01-08 ENCOUNTER — Inpatient Hospital Stay (HOSPITAL_COMMUNITY): Payer: Medicare Other

## 2020-01-08 DIAGNOSIS — I70211 Atherosclerosis of native arteries of extremities with intermittent claudication, right leg: Secondary | ICD-10-CM

## 2020-01-08 DIAGNOSIS — I70212 Atherosclerosis of native arteries of extremities with intermittent claudication, left leg: Secondary | ICD-10-CM

## 2020-01-08 DIAGNOSIS — L03115 Cellulitis of right lower limb: Secondary | ICD-10-CM | POA: Diagnosis not present

## 2020-01-08 LAB — CBC WITH DIFFERENTIAL/PLATELET
Abs Immature Granulocytes: 0.08 10*3/uL — ABNORMAL HIGH (ref 0.00–0.07)
Basophils Absolute: 0 10*3/uL (ref 0.0–0.1)
Basophils Relative: 0 %
Eosinophils Absolute: 0.1 10*3/uL (ref 0.0–0.5)
Eosinophils Relative: 1 %
HCT: 33 % — ABNORMAL LOW (ref 36.0–46.0)
Hemoglobin: 11.3 g/dL — ABNORMAL LOW (ref 12.0–15.0)
Immature Granulocytes: 1 %
Lymphocytes Relative: 10 %
Lymphs Abs: 1.3 10*3/uL (ref 0.7–4.0)
MCH: 31.4 pg (ref 26.0–34.0)
MCHC: 34.2 g/dL (ref 30.0–36.0)
MCV: 91.7 fL (ref 80.0–100.0)
Monocytes Absolute: 1.7 10*3/uL — ABNORMAL HIGH (ref 0.1–1.0)
Monocytes Relative: 14 %
Neutro Abs: 9.3 10*3/uL — ABNORMAL HIGH (ref 1.7–7.7)
Neutrophils Relative %: 74 %
Platelets: 113 10*3/uL — ABNORMAL LOW (ref 150–400)
RBC: 3.6 MIL/uL — ABNORMAL LOW (ref 3.87–5.11)
RDW: 15.4 % (ref 11.5–15.5)
WBC: 12.4 10*3/uL — ABNORMAL HIGH (ref 4.0–10.5)
nRBC: 0 % (ref 0.0–0.2)

## 2020-01-08 LAB — GLUCOSE, CAPILLARY
Glucose-Capillary: 234 mg/dL — ABNORMAL HIGH (ref 70–99)
Glucose-Capillary: 143 mg/dL — ABNORMAL HIGH (ref 70–99)
Glucose-Capillary: 122 mg/dL — ABNORMAL HIGH (ref 70–99)
Glucose-Capillary: 201 mg/dL — ABNORMAL HIGH (ref 70–99)

## 2020-01-08 LAB — BASIC METABOLIC PANEL
Anion gap: 13 (ref 5–15)
BUN: 43 mg/dL — ABNORMAL HIGH (ref 8–23)
CO2: 26 mmol/L (ref 22–32)
Calcium: 8.2 mg/dL — ABNORMAL LOW (ref 8.9–10.3)
Chloride: 94 mmol/L — ABNORMAL LOW (ref 98–111)
Creatinine, Ser: 7.33 mg/dL — ABNORMAL HIGH (ref 0.44–1.00)
GFR calc Af Amer: 6 mL/min — ABNORMAL LOW (ref >60)
GFR calc non Af Amer: 5 mL/min — ABNORMAL LOW (ref >60)
Glucose, Bld: 187 mg/dL — ABNORMAL HIGH (ref 70–99)
Potassium: 4.5 mmol/L (ref 3.5–5.1)
Sodium: 133 mmol/L — ABNORMAL LOW (ref 135–145)

## 2020-01-08 LAB — MRSA PCR SCREENING: MRSA by PCR: NEGATIVE

## 2020-01-08 MED ORDER — PIPERACILLIN-TAZOBACTAM 3.375 G IVPB 30 MIN
3.3750 g | Freq: Once | INTRAVENOUS | Status: AC
  Administered 2020-01-08: 3.375 g via INTRAVENOUS
  Filled 2020-01-08 (×2): qty 50

## 2020-01-08 MED ORDER — CYANOCOBALAMIN 1000 MCG/ML IJ SOLN
1000.0000 ug | INTRAMUSCULAR | Status: DC
  Administered 2020-01-09: 1000 ug via INTRAMUSCULAR
  Filled 2020-01-08: qty 1

## 2020-01-08 MED ORDER — VANCOMYCIN HCL IN DEXTROSE 750-5 MG/150ML-% IV SOLN
750.0000 mg | INTRAVENOUS | Status: AC
  Filled 2020-01-08: qty 150

## 2020-01-08 MED ORDER — CYANOCOBALAMIN 1000 MCG/ML IJ SOLN: 1000.0000 ug | INTRAMUSCULAR | Status: DC

## 2020-01-08 MED ORDER — VANCOMYCIN HCL 1750 MG/350ML IV SOLN
1750.0000 mg | Freq: Once | INTRAVENOUS | Status: AC
  Administered 2020-01-08: 1750 mg via INTRAVENOUS
  Filled 2020-01-08: qty 350

## 2020-01-08 MED ORDER — PIPERACILLIN-TAZOBACTAM IN DEX 2-0.25 GM/50ML IV SOLN
2.2500 g | Freq: Three times a day (TID) | INTRAVENOUS | Status: AC
  Administered 2020-01-08 – 2020-01-09 (×4): 2.25 g via INTRAVENOUS
  Filled 2020-01-08 (×5): qty 50

## 2020-01-08 MED ORDER — VANCOMYCIN HCL IN DEXTROSE 1-5 GM/200ML-% IV SOLN
1000.0000 mg | Freq: Once | INTRAVENOUS | Status: DC
  Filled 2020-01-08: qty 200

## 2020-01-08 NOTE — Progress Notes (Signed)
Pharmacy Antibiotic Note  [name] is a 79 y.o. female admitted on 01/05/2020, now with cellulitis.  Pharmacy has been consulted for vancomycin and Zosyn dosing.  Plan: Vancomycin 1750mg  x1 then 1000mg  IV every HD.  Goal pre-HD level 15-25 mcg/mL. Zosyn 2.25g IV Q8H.  Height: 5\' 8"  (172.7 cm) Weight: 81.6 kg (179 lb 14.3 oz) IBW/kg (Calculated) : 63.9  Temp (24hrs), Avg:98.8 F (37.1 C), Min:98.2 F (36.8 C), Max:99.4 F (37.4 C)  Recent Labs  Lab 01/05/20 1535 01/06/20 0411 01/06/20 0906 01/06/20 1827 01/07/20 0752 01/08/20 0201  WBC 23.6*  --  20.2* 19.9* 15.4* 12.4*  CREATININE 6.66* 7.65*  --  4.53* 5.77* 7.33*  LATICACIDVEN 1.4  --   --   --   --   --     Estimated Creatinine Clearance: 6.9 mL/min (A) (by C-G formula based on SCr of 7.33 mg/dL (H)).    Allergies  Allergen Reactions  . Ambien [Zolpidem] Other (See Comments)    Hallucinations   . Reglan [Metoclopramide] Other (See Comments)    "makes me crazy"    Thank you for allowing pharmacy to be a part of this patient's care.  Wynona Neat, PharmD, BCPS  01/08/2020 5:57 AM

## 2020-01-08 NOTE — Progress Notes (Signed)
VASCULAR LAB PRELIMINARY  PRELIMINARY  PRELIMINARY  PRELIMINARY  ABIs completed.    Preliminary report:  See CV proc for preliminary results.  Davit Vassar, RVT 01/08/2020, 1:22 PM

## 2020-01-08 NOTE — Progress Notes (Signed)
[facility] Progress Note    Assessment/ Plan:   1. ESRD on hemodialysis Monday Wednesday Friday [facility] [place]) Outpatient orders: 4 hours, revaclear300, BFR 400/dfr 600. 3k, 2.5cal, 138na, 35bicarb. No heparin.  -No indication for dialysis today we will plan for dialysis on Monday 7/26 -Right arm precautions -Renal diet -Avoid nephrotoxic medications including NSAIDs and iodinated intravenous contrast exposure unless the latter is absolutely indicated.  Preferred narcotic agents for pain control are hydromorphone, fentanyl, and methadone. Morphine should not be used. Avoid Baclofen and avoid oral sodium phosphate and magnesium citrate based laxatives / bowel preps. Continue strict Input and Output monitoring. Will monitor the patient closely with you and intervene or adjust therapy as indicated by changes in clinical status/labs  2. Anemia of chronic kidney disease -hgb 11.1 and at goal -receives epogen 4k units outpatient (holding) 3. Secondary hyperparathyroidism -phos ok, can check PTH -recevies hectorol 3.1mcg w/ treatments 4. Sepsis secondary to cellulitis of right foot associate with diabetic ulcer -Osteomyelitis ruled out -On Zosyn and vancomycin; renally dosed Management per primary service 5. HTN/volume status, bp at goal -EDW 82kg, will dialyze to edw as tolerated -labetalol -consider norvasc if BP control is needed   Gean Quint, MD [place] 01/07/2020, 2:04 PM   Subjective:   Still has some right foot pain.  Otherwise no new complaints.   Objective:   BP (!) 108/52 (BP Location: Left Arm)   Pulse 67   Temp 97.6 F (36.4 C) (Oral)   Resp 16   Ht 5\' 8"  (1.727 m)   Wt 81.6 kg   SpO2 99%   BMI 27.35 kg/m   Intake/Output Summary (Last 24 hours) at 01/08/2020 1619 Last data filed at 01/08/2020 1100 Gross per 24 hour  Intake 660 ml  Output --  Net 660 ml   Weight change:   Physical Exam: Gen: No acute distress,  nontoxic appearing CVS: Regular rate, S1-S2 Resp: Clear to auscultation bilaterally, normal work of breathing, no wheezes/rhonchi/rales/crackles, bilateral chest expansion Abd: Soft, nontender/nondistended Ext: Right upper extremity AVF with good bruit and thrill, right foot with erythema and warmth Neuro: No focal deficits, aaox3   Imaging: VAS [place] ABI WITH/WO TBI  Result Date: 01/08/2020 LOWER EXTREMITY DOPPLER STUDY Indications: Ulceration, and cellulitis of right foot. High Risk Factors: Hypertension, Diabetes, prior CVA. Other Factors: ESRD, on dialysis.  Comparison Study: No prior study on file Performing Technologist: Sharion Dove RVS  Examination Guidelines: A complete evaluation includes at minimum, Doppler waveform signals and systolic blood pressure reading at the level of bilateral brachial, anterior tibial, and posterior tibial arteries, when vessel segments are accessible. Bilateral testing is considered an integral part of a complete examination. Photoelectric Plethysmograph (PPG) waveforms and toe systolic pressure readings are included as required and additional duplex testing as needed. Limited examinations for reoccurring indications may be performed as noted.  ABI Findings: +---------+------------------+-----+-----------+--------------------------+ Right    Rt Pressure (mmHg)IndexWaveform   Comment                    +---------+------------------+-----+-----------+--------------------------+ Brachial                        multiphasicdialysis access/restricted +---------+------------------+-----+-----------+--------------------------+ PTA      120               0.88 biphasic                              +---------+------------------+-----+-----------+--------------------------+  DP       125               0.92 biphasic                              +---------+------------------+-----+-----------+--------------------------+ Great Toe87                0.64                                        +---------+------------------+-----+-----------+--------------------------+ +---------+------------------+-----+---------+-------+ Left     Lt Pressure (mmHg)IndexWaveform Comment +---------+------------------+-----+---------+-------+ Brachial 136                    triphasic        +---------+------------------+-----+---------+-------+ PTA      93                0.68 biphasic         +---------+------------------+-----+---------+-------+ DP       90                0.66 biphasic         +---------+------------------+-----+---------+-------+ Great Toe65                0.48                  +---------+------------------+-----+---------+-------+ +-------+-----------+-----------+------------+------------+ ABI/TBIToday's ABIToday's TBIPrevious ABIPrevious TBI +-------+-----------+-----------+------------+------------+ Right  0.92       0.64                                +-------+-----------+-----------+------------+------------+ Left   0.66       0.48                                +-------+-----------+-----------+------------+------------+  Summary: Right: Resting right ankle-brachial index indicates mild right lower extremity arterial disease. The right toe-brachial index is abnormal. Left: Resting left ankle-brachial index indicates moderate left lower extremity arterial disease. The left toe-brachial index is abnormal.  *See table(s) above for measurements and observations.  Electronically signed by Monica Martinez MD on 01/08/2020 at 3:44:21 PM.    Final     Labs: BMET Recent Labs  Lab 01/05/20 1535 01/06/20 0411 01/06/20 1827 01/07/20 0752 01/08/20 0201  NA 136 136 136 135 133*  K 4.6 5.8* 4.1 4.3 4.5  CL 97* 99 94* 96* 94*  CO2 27 24 27 28 26   GLUCOSE 144* 127* 124* 198* 187*  BUN 26* 37* 17 28* 43*  CREATININE 6.66* 7.65* 4.53* 5.77* 7.33*  CALCIUM 9.0 8.4* 8.9 8.3* 8.2*  PHOS  --  4.6 3.2  --   --     CBC Recent Labs  Lab 01/05/20 1535 01/05/20 1535 01/06/20 0906 01/06/20 1827 01/07/20 0752 01/08/20 0201  WBC 23.6*   < > 20.2* 19.9* 15.4* 12.4*  NEUTROABS 20.4*  --  17.0*  --  12.4* 9.3*  HGB 12.4   < > 11.4* 13.0 11.1* 11.3*  HCT 37.7   < > 34.7* 38.3 32.8* 33.0*  MCV 95.4   < > 94.3 92.3 90.6 91.7  PLT 83*   < > 95* 98* 98* 113*   < > = values in this interval not displayed.  Medications:    . aspirin  162 mg Oral Daily  . Chlorhexidine Gluconate Cloth  6 each Topical Q0600  . [START ON 01/09/2020] cyanocobalamin  1,000 mcg Intramuscular Q30 days  . gabapentin  300 mg Oral Daily  . heparin injection (subcutaneous)  5,000 Units Subcutaneous Q8H  . insulin aspart  0-6 Units Subcutaneous TID WC  . insulin NPH Human  14 Units Subcutaneous BID AC  . labetalol  300 mg Oral BID  . multivitamin  1 tablet Oral QHS

## 2020-01-08 NOTE — Progress Notes (Addendum)
PROGRESS NOTE  LILLAH Howard ERD:[id number] DOB: 05/26/1941 DOA: 01/05/2020 PCP: [name], MD  HPI/Recap of past 24 hours: [name] is a 79 y.o. female with medical history significant for ESRD on HD MWF, IDT2DM, history of CVA, HTN, who presents to [facility] ED for evaluation of right foot cellulitis.  Follows with her podiatrist regularly for management of an ulcer on the plantar surface of the right foot at the base of the first MTP.  She had been wearing a boot for several weeks and noticed some swelling in her right leg since then.  She went to see her podiatrist on 01/05/2020 who felt she was developing cellulitis of the right foot.  She was subsequently sent to the ED for further evaluation and management.  In the ED, Temp 99.9 Fahrenheit.  Labs are notable for WBC 23.6K, hemoglobin 12.4K, platelets 83,000 (171,000 previously on 05/15/2019), lactic acid 1.4, sodium 136, potassium 4.6, bicarb 27, BUN 26, creatinine 6.66.  SARS-CoV-2 PCR negative.  Blood cultures x2 negative to date.  Right foot x-ray shows soft tissue swelling with ulcer/wound along plantar aspect of the foot at the level of the first MTP.  No definite acute osseous abnormality seen.  MRI ruled out osteomyelitis.  01/08/20: Seen and examined with her daughter at her bedside.  R foot pain persists although improved.  Poor dorsalis pedis pulses.  [place] ABI ordered to assess for PAD.  Daughter in the room requests referral to a different podiatrist.  TOC consulted to assist.   Assessment/Plan: Principal Problem:   Cellulitis of right foot Active Problems:   Diabetes mellitus, type 2 ([place])   Leukocytosis   Hypertension associated with diabetes ([place])   ESRD (end stage renal disease) on dialysis ([place])   History of CVA (cerebrovascular accident)   Thrombocytopenia ([place])   Diabetic ulcer of right foot associated with diabetes mellitus due to underlying condition ([place])  Sepsis,improving, secondary to  cellulitis of right foot associated with diabetic ulcer, osteomyelitis ruled out. Presented with leukocytosis 20K, tachycardia, tachypnea, cellulitis distal dorsal right foot on Xray and MRI.  Has plantar diabetic ulcer at base of first MTP without obvious drainage.  ESR 42  and CRP 20 No evidence of DVT on bilateral Doppler ultrasound No evidence of osteomyelitis on MRI right foot. Leukocytosis,improving , WBC 20 K> 15K >12K Initially received Rocephin Currently on IV Zosyn and IV vancomycin Renally dosed by pharmacy Continue pain control CBC w diff in the AM  Acute hyperkalemia, resolved with treatment and hemodialysis, in the setting of ESRD Serum potassium 5.8> 4.5 Treated on 7/23 with IV calcium gluconate 1 g x1 and 10 g of Lokelma x1 dose Had hemodialysis on 01/06/2020.  Bilateral lower extremity edema, resolved post hemodialysis, secondary to volume overload on exam Presented with 1+ pitting edema in lower extremities bilaterally, has improved after hemodialysis. No evidence of DVT on bilateral Doppler ultrasound Advised to elevate her legs when at rest  ESRD on HD MWF: Seen by Dr. Candiss Norse, appreciate nephrology's assistance. Had hemodialysis on 01/06/2020.  Next HD 01/09/20  Thrombocytopenia secondary to acute illness, improving: No evidence of bleeding Plt 95K, up trending> 98K>113K  Insulin-dependent type 2 diabetes, well controlled: A1C 7.2 on 01/05/20 Hold off prior to admission Novolin for now to avoid hypoglycemia Continue insulin sliding scale, sensitive.  Hypertension: Blood pressure is at goal Continue home labetalol.  Hydralazine as needed. Continue to monitor BP  History of CVA: Continue home aspirin 162 mg daily, monitor platelet counts  as above.  Not currently on statin therapy.  Vitamin B12 deficiency Resume monthly B12 IM injection after hemodialysis tomorrow 01/09/2020.   DVT prophylaxis:  Subcu heparin 3 times daily Code Status: Full code,  confirmed with patient Family Communication:  Updated her daughter at bedside  Consults called: Nephrology   Status is: Inpatient    Dispo: The patient is from: Home.               Anticipated d/c is to: Home.               Anticipated d/c date is: 01/10/2020              Patient currently Not medically stable due to persistent leukocytosis and ongoing treatment for sepsis secondary to right foot cellulitis with IV antibiotics.        Objective: Vitals:   01/07/20 0451 01/07/20 1410 01/07/20 2027 01/08/20 0538  BP: (!) 123/51 (!) 108/51 (!) 127/52 (!) 127/54  Pulse: 78 73 72 75  Resp: _0 Temp: 99.4 F (37.4 C) 98.2 F (36.8 C) 98.9 F (37.2 C) 99.4 F (37.4 C)  TempSrc: Oral Oral  Oral  SpO2: 95% 100% 97% 97%  Weight:      Height:        Intake/Output Summary (Last 24 hours) at 01/08/2020 1206 Last data filed at 01/08/2020 1100 Gross per 24 hour  Intake 897 ml  Output --  Net 897 ml   Filed Weights   01/05/20 1523 01/06/20 1224  Weight: 81.6 kg 81.6 kg    Exam:  . General: 79 y.o. year-old female pleasant well-developed in no acute distress.  Alert and interactive.  Hard of hearing. . Cardiovascular: Regular rate and rhythm no rubs or gallops.   Marland Kitchen Respiratory: Clear to auscultation no wheezes no rales.   . Abdomen: Soft nontender normal bowel sounds present.   . Musculoskeletal: Right foot in surgical dressing.  Left lower extremity nonedematous.  Marland Kitchen Psychiatry: Mood is appropriate for condition and setting.    Data Reviewed: CBC: Recent Labs  Lab 01/05/20 1535 01/06/20 0906 01/06/20 1827 01/07/20 0752 01/08/20 0201  WBC 23.6* 20.2* 19.9* 15.4* 12.4*  NEUTROABS 20.4* 17.0*  --  12.4* 9.3*  HGB 12.4 11.4* 13.0 11.1* 11.3*  HCT 37.7 34.7* 38.3 32.8* 33.0*  MCV 95.4 94.3 92.3 90.6 91.7  PLT 83* 95* 98* 98* 416*   Basic Metabolic Panel: Recent Labs  Lab 01/05/20 1535 01/06/20 0411 01/06/20 1827 01/07/20 0752 01/08/20 0201  NA 136  136 136 135 133*  K 4.6 5.8* 4.1 4.3 4.5  CL 97* 99 94* 96* 94*  CO2 _1 GLUCOSE 144* 127* 124* 198* 187*  BUN 26* 37* 17 28* 43*  CREATININE 6.66* 7.65* 4.53* 5.77* 7.33*  CALCIUM 9.0 8.4* 8.9 8.3* 8.2*  PHOS  --  4.6 3.2  --   --    GFR: Estimated Creatinine Clearance: 6.9 mL/min (A) (by C-G formula based on SCr of 7.33 mg/dL (H)). Liver Function Tests: Recent Labs  Lab 01/05/20 1535 01/06/20 0411 01/06/20 1827  AST 19  --   --   ALT 18  --   --   ALKPHOS 78  --   --   BILITOT 1.2  --   --   PROT 7.8  --   --   ALBUMIN 3.4* 2.8* 2.9*   No results for input(s): LIPASE, AMYLASE in the last 168 hours. No results for  input(s): AMMONIA in the last 168 hours. Coagulation Profile: Recent Labs  Lab 01/05/20 1830  INR 1.4*   Cardiac Enzymes: No results for input(s): CKTOTAL, CKMB, CKMBINDEX, TROPONINI in the last 168 hours. BNP (last 3 results) No results for input(s): PROBNP in the last 8760 hours. HbA1C: Recent Labs    01/05/20 1535  HGBA1C 7.7*   CBG: Recent Labs  Lab 01/07/20 1146 01/07/20 1638 01/07/20 2105 01/08/20 0741 01/08/20 1141  GLUCAP 115* 191* 239* 234* 143*   Lipid Profile: No results for input(s): CHOL, HDL, LDLCALC, TRIG, CHOLHDL, LDLDIRECT in the last 72 hours. Thyroid Function Tests: No results for input(s): TSH, T4TOTAL, FREET4, T3FREE, THYROIDAB in the last 72 hours. Anemia Panel: No results for input(s): VITAMINB12, FOLATE, FERRITIN, TIBC, IRON, RETICCTPCT in the last 72 hours. Urine analysis:    Component Value Date/Time   COLORURINE YELLOW 10/15/2017 2244   APPEARANCEUR HAZY (A) 10/15/2017 2244   LABSPEC 1.015 10/15/2017 2244   PHURINE 7.0 10/15/2017 2244   GLUCOSEU >=500 (A) 10/15/2017 2244   HGBUR SMALL (A) 10/15/2017 2244   BILIRUBINUR NEGATIVE 10/15/2017 2244   KETONESUR NEGATIVE 10/15/2017 2244   PROTEINUR 100 (A) 10/15/2017 2244   UROBILINOGEN 0.2 11/19/2014 0909   NITRITE NEGATIVE 10/15/2017 2244    LEUKOCYTESUR MODERATE (A) 10/15/2017 2244   Sepsis Labs: _0 (procalcitonin:4,lacticidven:4)  ) Recent Results (from the past 240 hour(s))  Blood Culture (routine x 2)     Status: None (Preliminary result)   Collection Time: 01/05/20  6:30 PM   Specimen: BLOOD  Result Value Ref Range Status   Specimen Description BLOOD RIGHT ANTECUBITAL  Final   Special Requests   Final    BOTTLES DRAWN AEROBIC AND ANAEROBIC Blood Culture adequate volume   Culture   Final    NO GROWTH 3 DAYS Performed at [facility], [place] [address] [place], [place] [address]    Report Status PENDING  Incomplete  SARS Coronavirus 2 by RT PCR (hospital order, performed in [facility] hospital lab) Nasopharyngeal Nasopharyngeal Swab     Status: None   Collection Time: 01/05/20  6:30 PM   Specimen: Nasopharyngeal Swab  Result Value Ref Range Status   SARS Coronavirus 2 NEGATIVE NEGATIVE Final    Comment: (NOTE) SARS-CoV-2 target nucleic acids are NOT DETECTED.  The SARS-CoV-2 RNA is generally detectable in upper and lower respiratory specimens during the acute phase of infection. The lowest concentration of SARS-CoV-2 viral copies this assay can detect is 250 copies / mL. A negative result does not preclude SARS-CoV-2 infection and should not be used as the sole basis for treatment or other patient management decisions.  A negative result may occur with improper specimen collection / handling, submission of specimen other than nasopharyngeal swab, presence of viral mutation(s) within the areas targeted by this assay, and inadequate number of viral copies (<250 copies / mL). A negative result must be combined with clinical observations, patient history, and epidemiological information.  Fact Sheet for Patients:   StrictlyIdeas.no  Fact Sheet for Healthcare Providers: BankingDealers.co.za  This test is not yet approved or  cleared by the [place]  FDA and has been authorized for detection and/or diagnosis of SARS-CoV-2 by FDA under an Emergency Use Authorization (EUA).  This EUA will remain in effect (meaning this test can be used) for the duration of the COVID-19 declaration under Section 564(b)(1) of the Act, 21 U.S.C. section 360bbb-3(b)(1), unless the authorization is terminated or revoked sooner.  Performed at [facility], [address], [place], [place] [address]   Blood Culture (routine x 2)     Status: None (Preliminary result)   Collection Time: 01/05/20  7:04 PM   Specimen: BLOOD LEFT HAND  Result Value Ref Range Status   Specimen Description BLOOD LEFT HAND  Final   Special Requests   Final    BOTTLES DRAWN AEROBIC AND ANAEROBIC Blood Culture results may not be optimal due to an inadequate volume of blood received in culture bottles   Culture   Final    NO GROWTH 3 DAYS Performed at [facility], [place] [address], [place], [place] [address]    Report Status PENDING  Incomplete  MRSA PCR Screening     Status: None   Collection Time: 01/08/20  5:48 AM   Specimen: Nasal Mucosa; Nasopharyngeal  Result Value Ref Range Status   MRSA by PCR NEGATIVE NEGATIVE Final    Comment:        The GeneXpert MRSA Assay (FDA approved for NASAL specimens only), is one component of a comprehensive MRSA colonization surveillance program. It is not intended to diagnose MRSA infection nor to guide or monitor treatment for MRSA infections. Performed at [facility], [place] [address], [place], [place] [address]       Studies: No results found.  Scheduled Meds: . aspirin  162 mg Oral Daily  . Chlorhexidine Gluconate Cloth  6 each Topical Q0600  . [START ON 01/09/2020] cyanocobalamin  1,000 mcg Intramuscular Q30 days  . gabapentin  300 mg Oral Daily  . heparin injection (subcutaneous)  5,000 Units Subcutaneous Q8H  . insulin aspart  0-6 Units Subcutaneous TID WC  . insulin NPH Human  14 Units Subcutaneous BID  AC  . labetalol  300 mg Oral BID  . multivitamin  1 tablet Oral QHS    Continuous Infusions: . sodium chloride    . sodium chloride    . piperacillin-tazobactam (ZOSYN)  IV    . [START ON 01/09/2020] vancomycin       LOS: 2 days     Kayleen Memos, MD [facility] Pager [phone]  If 7PM-7AM, please contact night-coverage [email or web address] Password [facility] 01/08/2020, 12:06 PM

## 2020-01-09 ENCOUNTER — Inpatient Hospital Stay (HOSPITAL_COMMUNITY): Payer: Medicare Other

## 2020-01-09 DIAGNOSIS — L03115 Cellulitis of right lower limb: Secondary | ICD-10-CM | POA: Diagnosis not present

## 2020-01-09 DIAGNOSIS — R569 Unspecified convulsions: Secondary | ICD-10-CM

## 2020-01-09 DIAGNOSIS — A419 Sepsis, unspecified organism: Principal | ICD-10-CM

## 2020-01-09 LAB — CBC WITH DIFFERENTIAL/PLATELET
Abs Immature Granulocytes: 0.05 10*3/uL (ref 0.00–0.07)
Basophils Absolute: 0 10*3/uL (ref 0.0–0.1)
Basophils Relative: 0 %
Eosinophils Absolute: 0.1 10*3/uL (ref 0.0–0.5)
Eosinophils Relative: 1 %
HCT: 31.6 % — ABNORMAL LOW (ref 36.0–46.0)
Hemoglobin: 10.9 g/dL — ABNORMAL LOW (ref 12.0–15.0)
Immature Granulocytes: 0 %
Lymphocytes Relative: 9 %
Lymphs Abs: 1.1 10*3/uL (ref 0.7–4.0)
MCH: 31.2 pg (ref 26.0–34.0)
MCHC: 34.5 g/dL (ref 30.0–36.0)
MCV: 90.5 fL (ref 80.0–100.0)
Monocytes Absolute: 1.5 10*3/uL — ABNORMAL HIGH (ref 0.1–1.0)
Monocytes Relative: 13 %
Neutro Abs: 8.6 10*3/uL — ABNORMAL HIGH (ref 1.7–7.7)
Neutrophils Relative %: 77 %
Platelets: 145 10*3/uL — ABNORMAL LOW (ref 150–400)
RBC: 3.49 MIL/uL — ABNORMAL LOW (ref 3.87–5.11)
RDW: 15 % (ref 11.5–15.5)
WBC: 11.3 10*3/uL — ABNORMAL HIGH (ref 4.0–10.5)
nRBC: 0 % (ref 0.0–0.2)

## 2020-01-09 LAB — BASIC METABOLIC PANEL
Anion gap: 17 — ABNORMAL HIGH (ref 5–15)
BUN: 61 mg/dL — ABNORMAL HIGH (ref 8–23)
CO2: 21 mmol/L — ABNORMAL LOW (ref 22–32)
Calcium: 8 mg/dL — ABNORMAL LOW (ref 8.9–10.3)
Chloride: 95 mmol/L — ABNORMAL LOW (ref 98–111)
Creatinine, Ser: 9.42 mg/dL — ABNORMAL HIGH (ref 0.44–1.00)
GFR calc Af Amer: 4 mL/min — ABNORMAL LOW (ref >60)
GFR calc non Af Amer: 4 mL/min — ABNORMAL LOW (ref >60)
Glucose, Bld: 149 mg/dL — ABNORMAL HIGH (ref 70–99)
Potassium: 5.2 mmol/L — ABNORMAL HIGH (ref 3.5–5.1)
Sodium: 133 mmol/L — ABNORMAL LOW (ref 135–145)

## 2020-01-09 LAB — CBC
HCT: 30.3 % — ABNORMAL LOW (ref 36.0–46.0)
Hemoglobin: 10.4 g/dL — ABNORMAL LOW (ref 12.0–15.0)
MCH: 30.7 pg (ref 26.0–34.0)
MCHC: 34.3 g/dL (ref 30.0–36.0)
MCV: 89.4 fL (ref 80.0–100.0)
Platelets: 132 10*3/uL — ABNORMAL LOW (ref 150–400)
RBC: 3.39 MIL/uL — ABNORMAL LOW (ref 3.87–5.11)
RDW: 14.8 % (ref 11.5–15.5)
WBC: 11.2 10*3/uL — ABNORMAL HIGH (ref 4.0–10.5)
nRBC: 0 % (ref 0.0–0.2)

## 2020-01-09 LAB — GLUCOSE, CAPILLARY
Glucose-Capillary: 95 mg/dL (ref 70–99)
Glucose-Capillary: 100 mg/dL — ABNORMAL HIGH (ref 70–99)
Glucose-Capillary: 131 mg/dL — ABNORMAL HIGH (ref 70–99)
Glucose-Capillary: 231 mg/dL — ABNORMAL HIGH (ref 70–99)

## 2020-01-09 LAB — HEPATITIS B SURFACE ANTIGEN: Hepatitis B Surface Ag: NONREACTIVE

## 2020-01-09 MED ORDER — LABETALOL HCL 100 MG PO TABS
100.0000 mg | ORAL_TABLET | Freq: Two times a day (BID) | ORAL | Status: DC
  Administered 2020-01-09 – 2020-01-17 (×16): 100 mg via ORAL
  Filled 2020-01-09 (×16): qty 1

## 2020-01-09 MED ORDER — SODIUM CHLORIDE 0.9 % IV SOLN: 100.0000 mL | INTRAVENOUS | Status: DC | PRN

## 2020-01-09 MED ORDER — AMOXICILLIN-POT CLAVULANATE 500-125 MG PO TABS
1.0000 | ORAL_TABLET | Freq: Every day | ORAL | Status: DC
  Administered 2020-01-10 – 2020-01-16 (×6): 500 mg via ORAL
  Filled 2020-01-09 (×8): qty 1

## 2020-01-09 MED ORDER — SODIUM ZIRCONIUM CYCLOSILICATE 10 G PO PACK
10.0000 g | PACK | Freq: Once | ORAL | Status: AC
  Administered 2020-01-09: 10 g via ORAL
  Filled 2020-01-09: qty 1

## 2020-01-09 MED ORDER — DOXYCYCLINE HYCLATE 100 MG PO TABS
100.0000 mg | ORAL_TABLET | Freq: Two times a day (BID) | ORAL | Status: DC
  Administered 2020-01-10 – 2020-01-17 (×15): 100 mg via ORAL
  Filled 2020-01-09 (×15): qty 1

## 2020-01-09 MED ORDER — LEVETIRACETAM 250 MG PO TABS: 250.0000 mg | ORAL_TABLET | Freq: Two times a day (BID) | ORAL | Status: DC

## 2020-01-09 MED ORDER — LORAZEPAM 2 MG/ML IJ SOLN
0.5000 mg | INTRAMUSCULAR | Status: DC | PRN
  Administered 2020-01-10 – 2020-01-15 (×2): 1 mg via INTRAVENOUS
  Filled 2020-01-09 (×2): qty 1

## 2020-01-09 MED ORDER — LEVETIRACETAM 250 MG PO TABS
250.0000 mg | ORAL_TABLET | Freq: Two times a day (BID) | ORAL | Status: DC
  Administered 2020-01-09 – 2020-01-17 (×17): 250 mg via ORAL
  Filled 2020-01-09 (×18): qty 1

## 2020-01-09 MED ORDER — LORAZEPAM 2 MG/ML IJ SOLN
1.0000 mg | Freq: Once | INTRAMUSCULAR | Status: AC
  Administered 2020-01-09: 1 mg via INTRAVENOUS
  Filled 2020-01-09: qty 1

## 2020-01-09 MED ORDER — VANCOMYCIN HCL IN DEXTROSE 750-5 MG/150ML-% IV SOLN
INTRAVENOUS | Status: AC
  Administered 2020-01-09: 750 mg via INTRAVENOUS
  Filled 2020-01-09: qty 150

## 2020-01-09 NOTE — Progress Notes (Signed)
EEG complete - results pending 

## 2020-01-09 NOTE — Consult Note (Signed)
Neurology Consultation Reason for Consult: seizure like activity Referring Physician: Dr Irene Pap  CC: Right foot cellulitis  History is obtained from:patient and chart review  HPI: [name] is a 79 y.o. female with PMH of ESRD on HD MWF, IDT2DM, history of CVA, HTN who was admitted to medicine service on 01/05/2020 for right foot cellulitis and has been on Vanc/Zosyn. This morning patient was noted to have generalized shaking concerning for seizure x2. Per daughter, patient was on keppra but stopped taking it about a year ago after her prescription got over.  Patient is a poor historian and denies having seizures in past, also doesn't remember ever being on keppra.   ROS: All other systems reviewed and negative except as noted in the HPI.   Past Medical History:  Diagnosis Date  . Anemia   . Cataract   . Chronic kidney disease   . CVA (cerebral infarction)   . Diabetes mellitus    Type 2  . Diabetes mellitus without complication ([place])   . Dialysis patient ([employer])   . Dialysis patient ([place])    M, W, F  . Fistula    R arm  . GERD (gastroesophageal reflux disease)   . Hypertension   . Renal disorder   . Shortness of breath   . Stroke [facility])    right side weakness    Family History  Problem Relation Age of Onset  . Cancer Sister   . Cancer Brother   . Anesthesia problems Neg Hx   . Hypotension Neg Hx   . Malignant hyperthermia Neg Hx   . Pseudochol deficiency Neg Hx     Social History:  reports that she has never smoked. She has never used smokeless tobacco. She reports that she does not drink alcohol and does not use drugs.   Exam: Current vital signs: BP (!) 116/53 (BP Location: Left Arm)   Pulse 64   Temp 98 F (36.7 C) (Oral)   Resp 15   Ht 5\' 8"  (1.727 m)   Wt 81.6 kg   SpO2 100%   BMI 27.35 kg/m  Vital signs in last 24 hours: Temp:  [97.6 F (36.4 C)-98.6 F (37 C)] 98 F (36.7 C) (07/26 1226) Pulse Rate:  [63-75] 64 (07/26 1226) Resp:   [15-19] 15 (07/26 1226) BP: (108-157)/(47-75) 116/53 (07/26 1226) SpO2:  [96 %-100 %] 100 % (07/26 1226)   Physical Exam  Constitutional: Appears well-developed and well-nourished.  Psych: Affect appropriate to situation Eyes: No scleral injection HENT: No OP obstrucion Head: Normocephalic.  Cardiovascular: Normal rate and regular rhythm.  Respiratory: Effort normal, non-labored breathing GI: Soft.  No distension. There is no tenderness.  Skin: Warm  Neuro: Mental Status: Patient is awake, alert, oriented to person, place: room, month:july, year 2020 Cranial Nerves: possible left sided visual neglect, PERLA, no apparent facial asymetry, tongue midline. Motor: 4/5 in all four extremities. Sensory: possible left lower extremity, sensory neglect, at times patient could tell me when I was touching both legs, but most of the times she was not.   I have reviewed labs in epic and the results pertinent to this consultation are: Wbc -11.3 plts - 145 Na - 133 K - 5.2 BUN - 61 Scr - 9.42  I have reviewed the images obtained: CT Head WO Contrast 07/19/19: Stable atrophy and chronic white matter microvascular ischemic changes. No interval change or acute finding by noncontrast CT.  ASSESSMENT/PLAN: 78 year old female with multiple comorbidities admitted for  management of right foot cellulitis and was noted to have seizure like episodes x2.   Breakthrough seizure Encephalopathy, infectious Leukocytosis Thrombocytopenia Hyponatremia Hyperkalemia -Patient has possible seizure. Per patients daughter she has been on Keppra in the past, but stopped taking about a year ago, which likely precipitated breakthrough seizure in setting of infection. -On exam patient also appears to have possible left side visual and sensory neglect (exam difficult due to pt encephalopathy)  Recommendations: - Routine EEG did not show any evidence of seizure or potential epiloeptogenicity. - MRI brain w/o  contrast to look for any acute abnormalities - Resume home dose Keppra 250mg  bid. If patient has any further breakthrough seizures, will likely have to add another AED (oxcarbazepine) due to patients poor renal function. - Continuous seizure precautions. - PRN IV Ativan 0.5 - 1 mg for clinical seizures over 2 min. - Management of rest of the comorbidities per primary team.  Thank you for allowing [place] to participate in the care of this patient. Neurology will follow. Please contact us for any further questions.   I have spent a total of 80   minutes with the patient reviewing hospital notes,  test results, labs and examining the patient as well as establishing an assessment and plan that was discussed personally with the patient.  > 50% of time was spent in direct patient care.  Zeb Comfort Epilepsy [facility]

## 2020-01-09 NOTE — Progress Notes (Signed)
PROGRESS NOTE  [name] OFH:[id number] DOB: 01/11/1941 DOA: 01/05/2020 PCP: [name], MD  HPI/Recap of past 24 hours: [name] is a 79 y.o. female with medical history significant for ESRD on HD MWF, IDT2DM, history of CVA, HTN, who presents to [facility] ED for evaluation of right foot cellulitis.  Follows with her podiatrist regularly for management of an ulcer on the plantar surface of the right foot at the base of the first MTP.  She had been wearing a boot for several weeks and noticed some swelling in her right leg since then.  She went to see her podiatrist on 01/05/2020 who felt she was developing cellulitis of the right foot.  She was subsequently sent to the ED for further evaluation and management.  In the ED, Temp 99.9 Fahrenheit.  Labs are notable for WBC 23.6K, hemoglobin 12.4K, platelets 83,000 (171,000 previously on 05/15/2019), lactic acid 1.4, sodium 136, potassium 4.6, bicarb 27, BUN 26, creatinine 6.66.  SARS-CoV-2 PCR negative.  Blood cultures x2 negative to date.  Right foot x-ray shows soft tissue swelling with ulcer/wound along plantar aspect of the foot at the level of the first MTP.  No definite acute osseous abnormality seen.  MRI ruled out osteomyelitis.  Poor dorsalis pedis pulses.  [place] ABI, B/L + PAD.  Daughter in the room requests referral to a different podiatrist.  TOC consulted to assist.  01/09/20: generalized convulsions this AM.  Per her daughter ceased to take her AED Keppra about 1 year ago, did not return to neuro for refills.  EEG ordered, IV ativan for suspected seizure activity lasting about 1-2 minutes.  It recurred x 1 within 15 minutes. Confused afterwards.   Assessment/Plan: Principal Problem:   Cellulitis of right foot Active Problems:   Diabetes mellitus, type 2 (HCC)   Leukocytosis   Hypertension associated with diabetes ([place])   ESRD (end stage renal disease) on dialysis ([place])   History of CVA (cerebrovascular  accident)   Thrombocytopenia ([place])   Diabetic ulcer of right foot associated with diabetes mellitus due to underlying condition ([place])  Sepsis,improving, secondary to cellulitis of right foot associated with diabetic ulcer, osteomyelitis ruled out. Presented with leukocytosis 20K, tachycardia, tachypnea, cellulitis distal dorsal right foot on Xray and MRI.  Has plantar diabetic ulcer at base of first MTP without obvious drainage.  ESR 42  and CRP 20 No evidence of DVT on bilateral Doppler ultrasound No evidence of osteomyelitis on MRI right foot. Leukocytosis,improving , WBC 20 K> 15K >12K> 11.2K Initially received Rocephin Currently on IV Zosyn and IV vancomycin Renally dosed by pharmacy Continue pain control CBC w diff in the AM  Seizure disorder Stopped taking her Keppra Generalized convulsions this AM.  Per her daughter ceased to take her AED Keppra about 1 year ago, did not return to neuro for refills.  EEG ordered, IV ativan for suspected seizure activity lasting about 1-2 minutes.  It recurred x 1 within 15 minutes. Confused afterwards. Neuro consult Seizure precautions and frequent neurochecks  Acute hyperkalemia, resolved with treatment and hemodialysis, in the setting of ESRD Serum potassium 5.8> 4.5> 5.2 Gave 1 dose of lokelma Had hemodialysis on 01/06/2020 Planned HD today  Mild hypovolemic hyponatremia Volume status managed with hemodialysis HD planned today  Bilateral lower extremity edema, resolved post hemodialysis, secondary to volume overload on exam Presented with 1+ pitting edema in lower extremities bilaterally, has improved after hemodialysis. No evidence of DVT on bilateral Doppler ultrasound Advised to elevate her legs  when at rest  ESRD on HD MWF: Seen by Dr. Candiss Norse, appreciate nephrology's assistance. Had hemodialysis on 01/06/2020.  Next HD 01/09/20  Thrombocytopenia secondary to acute illness, improving: No evidence of bleeding Plt 95K, up  trending> 98K>113K> 132K  Insulin-dependent type 2 diabetes, well controlled: A1C 7.2 on 01/05/20 Hold off prior to admission Novolin for now to avoid hypoglycemia Continue insulin sliding scale, sensitive.  Hypertension: Blood pressure is at goal Continue home labetalol.  Hydralazine as needed. Continue to monitor BP  History of CVA: Continue home aspirin 162 mg daily, monitor platelet counts as above.  Not currently on statin therapy possibly due to ESRD.  Vitamin B12 deficiency Resume monthly B12 IM injection after hemodialysis tomorrow 01/09/2020.   DVT prophylaxis:  Subcu heparin 3 times daily Code Status: Full code, confirmed with patient Family Communication:  Updated her daughter at bedside  Consults called: Nephrology   Status is: Inpatient    Dispo: The patient is from: Home.               Anticipated d/c is to: Home.               Anticipated d/c date is: 01/10/2020              Patient currently Not medically stable due to persistent leukocytosis and ongoing treatment for sepsis secondary to right foot cellulitis with IV antibiotics.        Objective: Vitals:   01/09/20 1400 01/09/20 1408 01/09/20 1430 01/09/20 1500  BP: (!) 127/60 (!) 130/63 (!) 141/58 (!) 137/65  Pulse: 83 63 63 64  Resp: 13 12 12 12   Temp: 98.2 F (36.8 C)     TempSrc: Oral     SpO2: 98%     Weight: 81.4 kg     Height:        Intake/Output Summary (Last 24 hours) at 01/09/2020 1606 Last data filed at 01/09/2020 1427 Gross per 24 hour  Intake 460 ml  Output --  Net 460 ml   Filed Weights   01/05/20 1523 01/06/20 1224 01/09/20 1400  Weight: 81.6 kg 81.6 kg 81.4 kg    Exam:  . General: 79 y.o. year-old female well-developed well-nourished no distress.  Confused after suspected seizure activity.   . Cardiovascular: Regular rate and rhythm no rubs or gallops. Marland Kitchen Respiratory: Clear to auscultation no wheezes or rales. . Abdomen: Soft nontender normal bowel sounds  present. . Musculoskeletal: Right foot in surgical dressing.  Left lower extremity trace edema. Marland Kitchen Psychiatry: Unable to assess mood due to confusion.  Data Reviewed: CBC: Recent Labs  Lab 01/05/20 1535 01/05/20 1535 01/06/20 0906 01/06/20 0906 01/06/20 1827 01/07/20 0752 01/08/20 0201 01/09/20 0430 01/09/20 1450  WBC 23.6*   < > 20.2*   < > 19.9* 15.4* 12.4* 11.3* 11.2*  NEUTROABS 20.4*  --  17.0*  --   --  12.4* 9.3* 8.6*  --   HGB 12.4   < > 11.4*   < > 13.0 11.1* 11.3* 10.9* 10.4*  HCT 37.7   < > 34.7*   < > 38.3 32.8* 33.0* 31.6* 30.3*  MCV 95.4   < > 94.3   < > 92.3 90.6 91.7 90.5 89.4  PLT 83*   < > 95*   < > 98* 98* 113* 145* 132*   < > = values in this interval not displayed.   Basic Metabolic Panel: Recent Labs  Lab 01/06/20 0411 01/06/20 1827 01/07/20 0752 01/08/20 0201 01/09/20 0430  NA 136 136 135 133* 133*  K 5.8* 4.1 4.3 4.5 5.2*  CL 99 94* 96* 94* 95*  CO2 24 27 28 26  21*  GLUCOSE 127* 124* 198* 187* 149*  BUN 37* 17 28* 43* 61*  CREATININE 7.65* 4.53* 5.77* 7.33* 9.42*  CALCIUM 8.4* 8.9 8.3* 8.2* 8.0*  PHOS 4.6 3.2  --   --   --    GFR: Estimated Creatinine Clearance: 5.3 mL/min (A) (by C-G formula based on SCr of 9.42 mg/dL (H)). Liver Function Tests: Recent Labs  Lab 01/05/20 1535 01/06/20 0411 01/06/20 1827  AST 19  --   --   ALT 18  --   --   ALKPHOS 78  --   --   BILITOT 1.2  --   --   PROT 7.8  --   --   ALBUMIN 3.4* 2.8* 2.9*   No results for input(s): LIPASE, AMYLASE in the last 168 hours. No results for input(s): AMMONIA in the last 168 hours. Coagulation Profile: Recent Labs  Lab 01/05/20 1830  INR 1.4*   Cardiac Enzymes: No results for input(s): CKTOTAL, CKMB, CKMBINDEX, TROPONINI in the last 168 hours. BNP (last 3 results) No results for input(s): PROBNP in the last 8760 hours. HbA1C: No results for input(s): HGBA1C in the last 72 hours. CBG: Recent Labs  Lab 01/08/20 1141 01/08/20 1653 01/08/20 2134  01/09/20 0730 01/09/20 1223  GLUCAP 143* 122* 201* 95 100*   Lipid Profile: No results for input(s): CHOL, HDL, LDLCALC, TRIG, CHOLHDL, LDLDIRECT in the last 72 hours. Thyroid Function Tests: No results for input(s): TSH, T4TOTAL, FREET4, T3FREE, THYROIDAB in the last 72 hours. Anemia Panel: No results for input(s): VITAMINB12, FOLATE, FERRITIN, TIBC, IRON, RETICCTPCT in the last 72 hours. Urine analysis:    Component Value Date/Time   COLORURINE YELLOW 10/15/2017 2244   APPEARANCEUR HAZY (A) 10/15/2017 2244   LABSPEC 1.015 10/15/2017 2244   PHURINE 7.0 10/15/2017 2244   GLUCOSEU >=500 (A) 10/15/2017 2244   HGBUR SMALL (A) 10/15/2017 2244   BILIRUBINUR NEGATIVE 10/15/2017 2244   KETONESUR NEGATIVE 10/15/2017 2244   PROTEINUR 100 (A) 10/15/2017 2244   UROBILINOGEN 0.2 11/19/2014 0909   NITRITE NEGATIVE 10/15/2017 2244   LEUKOCYTESUR MODERATE (A) 10/15/2017 2244   Sepsis Labs: @LABRCNTIP (procalcitonin:4,lacticidven:4)  ) Recent Results (from the past 240 hour(s))  Blood Culture (routine x 2)     Status: None (Preliminary result)   Collection Time: 01/05/20  6:30 PM   Specimen: BLOOD  Result Value Ref Range Status   Specimen Description BLOOD RIGHT ANTECUBITAL  Final   Special Requests   Final    BOTTLES DRAWN AEROBIC AND ANAEROBIC Blood Culture adequate volume   Culture   Final    NO GROWTH 4 DAYS Performed at [facility], [place] [address] [place], [place] [address]    Report Status PENDING  Incomplete  SARS Coronavirus 2 by RT PCR (hospital order, performed in [facility] hospital lab) Nasopharyngeal Nasopharyngeal Swab     Status: None   Collection Time: 01/05/20  6:30 PM   Specimen: Nasopharyngeal Swab  Result Value Ref Range Status   SARS Coronavirus 2 NEGATIVE NEGATIVE Final    Comment: (NOTE) SARS-CoV-2 target nucleic acids are NOT DETECTED.  The SARS-CoV-2 RNA is generally detectable in upper and lower respiratory specimens during the acute phase of  infection. The lowest concentration of SARS-CoV-2 viral copies this assay can detect is 250 copies / mL. A negative result does not preclude  SARS-CoV-2 infection and should not be used as the sole basis for treatment or other patient management decisions.  A negative result may occur with improper specimen collection / handling, submission of specimen other than nasopharyngeal swab, presence of viral mutation(s) within the areas targeted by this assay, and inadequate number of viral copies (<250 copies / mL). A negative result must be combined with clinical observations, patient history, and epidemiological information.  Fact Sheet for Patients:   StrictlyIdeas.no  Fact Sheet for Healthcare Providers: BankingDealers.co.za  This test is not yet approved or  cleared by the [place] FDA and has been authorized for detection and/or diagnosis of SARS-CoV-2 by FDA under an Emergency Use Authorization (EUA).  This EUA will remain in effect (meaning this test can be used) for the duration of the COVID-19 declaration under Section 564(b)(1) of the Act, 21 U.S.C. section 360bbb-3(b)(1), unless the authorization is terminated or revoked sooner.  Performed at [facility], [place] [address] [place], [place] [address]   Blood Culture (routine x 2)     Status: None (Preliminary result)   Collection Time: 01/05/20  7:04 PM   Specimen: BLOOD LEFT HAND  Result Value Ref Range Status   Specimen Description BLOOD LEFT HAND  Final   Special Requests   Final    BOTTLES DRAWN AEROBIC AND ANAEROBIC Blood Culture results may not be optimal due to an inadequate volume of blood received in culture bottles   Culture   Final    NO GROWTH 4 DAYS Performed at [facility], [place] [address], [place], [place] [address]    Report Status PENDING  Incomplete  MRSA PCR Screening     Status: None   Collection Time: 01/08/20  5:48 AM   Specimen: Nasal  Mucosa; Nasopharyngeal  Result Value Ref Range Status   MRSA by PCR NEGATIVE NEGATIVE Final    Comment:        The GeneXpert MRSA Assay (FDA approved for NASAL specimens only), is one component of a comprehensive MRSA colonization surveillance program. It is not intended to diagnose MRSA infection nor to guide or monitor treatment for MRSA infections. Performed at [facility], [place] [address] [place], [place] [address]       Studies: EEG adult  Result Date: 2020-01-16 Lora Havens, MD     2020-01-16 11:14 AM Patient Name: [name] MRN: [id number] Epilepsy Attending: Lora Havens Referring Physician/Provider: Dr Irene Pap Date: 2020-01-16 Duration: 25.28 mins Patient history: 79 Yo F with sepsis.  EEG to evaluate for seizures. Level of alertness: Awake,asleep AEDs during EEG study: None Technical aspects: This EEG study was done with scalp electrodes positioned according to the 10-20 International system of electrode placement. Electrical activity was acquired at a sampling rate of 500Hz  and reviewed with a high frequency filter of 70Hz  and a low frequency filter of 1Hz . EEG data were recorded continuously and digitally stored. Description: The posterior dominant rhythm consists of 9 Hz activity of moderate voltage (25-35 uV) seen predominantly in posterior head regions, symmetric and reactive to eye opening and eye closing. Sleep was characterized by vertex waves, sleep spindles (12 to 14 Hz), maximal frontocentral region. Hyperventilation and photic stimulation were not performed.   IMPRESSION: This study is within normal limits. No seizures or epileptiform discharges were seen throughout the recording. Priyanka O Yadav    Scheduled Meds: . [START ON 01/10/2020] amoxicillin-clavulanate  1 tablet Oral q1800  . aspirin  162 mg  Oral Daily  . Chlorhexidine Gluconate Cloth  6 each Topical Q0600  . cyanocobalamin  1,000 mcg Intramuscular Q30 days  . [START ON  01/10/2020] doxycycline  100 mg Oral Q12H  . gabapentin  300 mg Oral Daily  . heparin injection (subcutaneous)  5,000 Units Subcutaneous Q8H  . insulin aspart  0-6 Units Subcutaneous TID WC  . insulin NPH Human  14 Units Subcutaneous BID AC  . labetalol  100 mg Oral BID  . levETIRAcetam  250 mg Oral BID  . multivitamin  1 tablet Oral QHS    Continuous Infusions: . sodium chloride    . sodium chloride    . sodium chloride    . sodium chloride    . piperacillin-tazobactam (ZOSYN)  IV 2.25 g (01/09/20 0537)  . vancomycin       LOS: 3 days     Kayleen Memos, MD [facility] Pager [phone]  If 7PM-7AM, please contact night-coverage [email or web address] Password [facility] 01/09/2020, 4:06 PM

## 2020-01-09 NOTE — Social Work (Signed)
Acknowledging consult, number for [facility] Provider Line [phone]) was added to discharge instructions for pt to be connected with a new podiatry provider.   Westley Hummer, MSW, [place] [facility]

## 2020-01-09 NOTE — Plan of Care (Signed)
?  Problem: Clinical Measurements: ?Goal: Ability to avoid or minimize complications of infection will improve ?Outcome: Progressing ?  ?Problem: Skin Integrity: ?Goal: Skin integrity will improve ?Outcome: Progressing ?  ?

## 2020-01-09 NOTE — Procedures (Signed)
Patient Name: [name]  MRN: [id number]  Epilepsy Attending: Lora Havens  Referring Physician/Provider: Dr Irene Pap Date: 01/09/2020 Duration: 25.28 mins  Patient history: 79 Yo F with sepsis.  EEG to evaluate for seizures.  Level of alertness: Awake,asleep  AEDs during EEG study: None  Technical aspects: This EEG study was done with scalp electrodes positioned according to the 10-20 International system of electrode placement. Electrical activity was acquired at a sampling rate of 500Hz  and reviewed with a high frequency filter of 70Hz  and a low frequency filter of 1Hz . EEG data were recorded continuously and digitally stored.   Description: The posterior dominant rhythm consists of 9 Hz activity of moderate voltage (25-35 uV) seen predominantly in posterior head regions, symmetric and reactive to eye opening and eye closing. Sleep was characterized by vertex waves, sleep spindles (12 to 14 Hz), maximal frontocentral region. Hyperventilation and photic stimulation were not performed.     IMPRESSION: This study is within normal limits. No seizures or epileptiform discharges were seen throughout the recording.  [name] [name]

## 2020-01-09 NOTE — Progress Notes (Signed)
[facility] Progress Note    Assessment/ Plan:   1. ESRD on hemodialysis  1. Monday Wednesday Friday ([place]). RUE AVF  Outpatient orders: 4 hours, revaclear300, BFR 400/dfr 600. 3k, 2.5cal, 138na, 35bicarb. No heparin.  2. HD on schedule today: 2K given inc K 2. Anemia of chronic kidney disease  hgb 11.1 and at goal  receives epogen 4k units outpatient (holding) 3. Secondary hyperparathyroidism; phos ok, can check PTH; recevies hectorol 3.38mcg w/ treatments 4. Sepsis secondary to cellulitis of right foot associate with diabetic ulcer 1. Osteomyelitis ruled out 2. On Zosyn and vancomycin 5. Management per primary service 1. HTN/volume status, bp at [facility] 2. EDW 82kg, will dialyze to edw as tolerate 3. Labetalol 4. -consider norvasc if BP control is needed   [facility]  Subjective:   No c/o Rec EEG   Objective:   BP (!) 110/50 (BP Location: Left Arm)   Pulse 63   Temp 98 F (36.7 C) (Oral)   Resp 17   Ht 5\' 8"  (1.727 m)   Wt 81.6 kg   SpO2 99%   BMI 27.35 kg/m   Intake/Output Summary (Last 24 hours) at 01/09/2020 1013 Last data filed at 01/09/2020 0737 Gross per 24 hour  Intake 810 ml  Output --  Net 810 ml   Weight change:   Physical Exam: Gen: No acute distress, nontoxic appearing CVS: Regular rate, S1-S2 Resp: Clear to auscultation bilaterally, normal work of breathing, no wheezes/rhonchi/rales/crackles, bilateral chest expansion Abd: Soft, nontender/nondistended Ext: Right upper extremity AVF with good bruit and thrill, right foot with erythema and warmth Neuro: No focal deficits, aaox3   Imaging: VAS [place] ABI WITH/WO TBI  Result Date: 01/08/2020 LOWER EXTREMITY DOPPLER STUDY Indications: Ulceration, and cellulitis of right foot. High Risk Factors: Hypertension, Diabetes, prior CVA. Other Factors: ESRD, on dialysis.  Comparison Study: No prior study on file Performing Technologist: Sharion Dove  RVS  Examination Guidelines: A complete evaluation includes at minimum, Doppler waveform signals and systolic blood pressure reading at the level of bilateral brachial, anterior tibial, and posterior tibial arteries, when vessel segments are accessible. Bilateral testing is considered an integral part of a complete examination. Photoelectric Plethysmograph (PPG) waveforms and toe systolic pressure readings are included as required and additional duplex testing as needed. Limited examinations for reoccurring indications may be performed as noted.  ABI Findings: +---------+------------------+-----+-----------+--------------------------+ Right    Rt Pressure (mmHg)IndexWaveform   Comment                    +---------+------------------+-----+-----------+--------------------------+ Brachial                        multiphasicdialysis access/restricted +---------+------------------+-----+-----------+--------------------------+ PTA      120               0.88 biphasic                              +---------+------------------+-----+-----------+--------------------------+ DP       125               0.92 biphasic                              +---------+------------------+-----+-----------+--------------------------+ Great Toe87                0.64                                       +---------+------------------+-----+-----------+--------------------------+ +---------+------------------+-----+---------+-------+  Left     Lt Pressure (mmHg)IndexWaveform Comment +---------+------------------+-----+---------+-------+ Brachial 136                    triphasic        +---------+------------------+-----+---------+-------+ PTA      93                0.68 biphasic         +---------+------------------+-----+---------+-------+ DP       90                0.66 biphasic         +---------+------------------+-----+---------+-------+ Great Toe65                0.48                   +---------+------------------+-----+---------+-------+ +-------+-----------+-----------+------------+------------+ ABI/TBIToday's ABIToday's TBIPrevious ABIPrevious TBI +-------+-----------+-----------+------------+------------+ Right  0.92       0.64                                +-------+-----------+-----------+------------+------------+ Left   0.66       0.48                                +-------+-----------+-----------+------------+------------+  Summary: Right: Resting right ankle-brachial index indicates mild right lower extremity arterial disease. The right toe-brachial index is abnormal. Left: Resting left ankle-brachial index indicates moderate left lower extremity arterial disease. The left toe-brachial index is abnormal.  *See table(s) above for measurements and observations.  Electronically signed by Monica Martinez MD on 01/08/2020 at 3:44:21 PM.    Final     Labs: BMET Recent Labs  Lab 01/05/20 1535 01/06/20 0411 01/06/20 1827 01/07/20 0752 01/08/20 0201 01/09/20 0430  NA 136 136 136 135 133* 133*  K 4.6 5.8* 4.1 4.3 4.5 5.2*  CL 97* 99 94* 96* 94* 95*  CO2 27 24 27 28 26  21*  GLUCOSE 144* 127* 124* 198* 187* 149*  BUN 26* 37* 17 28* 43* 61*  CREATININE 6.66* 7.65* 4.53* 5.77* 7.33* 9.42*  CALCIUM 9.0 8.4* 8.9 8.3* 8.2* 8.0*  PHOS  --  4.6 3.2  --   --   --    CBC Recent Labs  Lab 01/06/20 0906 01/06/20 0906 01/06/20 1827 01/07/20 0752 01/08/20 0201 01/09/20 0430  WBC 20.2*   < > 19.9* 15.4* 12.4* 11.3*  NEUTROABS 17.0*  --   --  12.4* 9.3* 8.6*  HGB 11.4*   < > 13.0 11.1* 11.3* 10.9*  HCT 34.7*   < > 38.3 32.8* 33.0* 31.6*  MCV 94.3   < > 92.3 90.6 91.7 90.5  PLT 95*   < > 98* 98* 113* 145*   < > = values in this interval not displayed.    Medications:    . aspirin  162 mg Oral Daily  . Chlorhexidine Gluconate Cloth  6 each Topical Q0600  . cyanocobalamin  1,000 mcg Intramuscular Q30 days  . gabapentin  300 mg Oral Daily  .  heparin injection (subcutaneous)  5,000 Units Subcutaneous Q8H  . insulin aspart  0-6 Units Subcutaneous TID WC  . insulin NPH Human  14 Units Subcutaneous BID AC  . labetalol  100 mg Oral BID  . levETIRAcetam  250 mg Oral BID  . multivitamin  1 tablet Oral QHS  . sodium zirconium cyclosilicate  10 g Oral Once

## 2020-01-09 NOTE — Plan of Care (Signed)
  Problem: Clinical Measurements: Goal: Ability to avoid or minimize complications of infection will improve Outcome: Progressing   

## 2020-01-10 ENCOUNTER — Inpatient Hospital Stay (HOSPITAL_COMMUNITY): Payer: Medicare Other

## 2020-01-10 DIAGNOSIS — R569 Unspecified convulsions: Secondary | ICD-10-CM | POA: Diagnosis not present

## 2020-01-10 DIAGNOSIS — L03115 Cellulitis of right lower limb: Secondary | ICD-10-CM | POA: Diagnosis not present

## 2020-01-10 LAB — CBC WITH DIFFERENTIAL/PLATELET
Abs Immature Granulocytes: 0.08 10*3/uL — ABNORMAL HIGH (ref 0.00–0.07)
Basophils Absolute: 0 10*3/uL (ref 0.0–0.1)
Basophils Relative: 0 %
Eosinophils Absolute: 0.2 10*3/uL (ref 0.0–0.5)
Eosinophils Relative: 2 %
HCT: 34.1 % — ABNORMAL LOW (ref 36.0–46.0)
Hemoglobin: 11.8 g/dL — ABNORMAL LOW (ref 12.0–15.0)
Immature Granulocytes: 1 %
Lymphocytes Relative: 11 %
Lymphs Abs: 1.1 10*3/uL (ref 0.7–4.0)
MCH: 31.4 pg (ref 26.0–34.0)
MCHC: 34.6 g/dL (ref 30.0–36.0)
MCV: 90.7 fL (ref 80.0–100.0)
Monocytes Absolute: 1.5 10*3/uL — ABNORMAL HIGH (ref 0.1–1.0)
Monocytes Relative: 15 %
Neutro Abs: 6.9 10*3/uL (ref 1.7–7.7)
Neutrophils Relative %: 71 %
Platelets: 129 10*3/uL — ABNORMAL LOW (ref 150–400)
RBC: 3.76 MIL/uL — ABNORMAL LOW (ref 3.87–5.11)
RDW: 15.1 % (ref 11.5–15.5)
WBC: 9.8 10*3/uL (ref 4.0–10.5)
nRBC: 0 % (ref 0.0–0.2)

## 2020-01-10 LAB — BASIC METABOLIC PANEL
Anion gap: 11 (ref 5–15)
BUN: 25 mg/dL — ABNORMAL HIGH (ref 8–23)
CO2: 28 mmol/L (ref 22–32)
Calcium: 8.1 mg/dL — ABNORMAL LOW (ref 8.9–10.3)
Chloride: 95 mmol/L — ABNORMAL LOW (ref 98–111)
Creatinine, Ser: 5.44 mg/dL — ABNORMAL HIGH (ref 0.44–1.00)
GFR calc Af Amer: 8 mL/min — ABNORMAL LOW (ref >60)
GFR calc non Af Amer: 7 mL/min — ABNORMAL LOW (ref >60)
Glucose, Bld: 137 mg/dL — ABNORMAL HIGH (ref 70–99)
Potassium: 3.4 mmol/L — ABNORMAL LOW (ref 3.5–5.1)
Sodium: 134 mmol/L — ABNORMAL LOW (ref 135–145)

## 2020-01-10 LAB — CULTURE, BLOOD (ROUTINE X 2)
Culture: NO GROWTH
Culture: NO GROWTH
Special Requests: ADEQUATE

## 2020-01-10 LAB — GLUCOSE, CAPILLARY
Glucose-Capillary: 62 mg/dL — ABNORMAL LOW (ref 70–99)
Glucose-Capillary: 68 mg/dL — ABNORMAL LOW (ref 70–99)
Glucose-Capillary: 95 mg/dL (ref 70–99)
Glucose-Capillary: 123 mg/dL — ABNORMAL HIGH (ref 70–99)
Glucose-Capillary: 196 mg/dL — ABNORMAL HIGH (ref 70–99)
Glucose-Capillary: 222 mg/dL — ABNORMAL HIGH (ref 70–99)

## 2020-01-10 MED ORDER — INSULIN NPH (HUMAN) (ISOPHANE) 100 UNIT/ML ~~LOC~~ SUSP
5.0000 [IU] | Freq: Two times a day (BID) | SUBCUTANEOUS | Status: DC
  Administered 2020-01-10 – 2020-01-11 (×2): 5 [IU] via SUBCUTANEOUS
  Filled 2020-01-10: qty 10

## 2020-01-10 MED ORDER — DOXERCALCIFEROL 4 MCG/2ML IV SOLN
3.0000 ug | INTRAVENOUS | Status: DC
  Administered 2020-01-11 – 2020-01-16 (×2): 3 ug via INTRAVENOUS
  Filled 2020-01-10 (×2): qty 2

## 2020-01-10 MED ORDER — DEXTROSE 50 % IV SOLN
INTRAVENOUS | Status: AC
  Filled 2020-01-10: qty 50

## 2020-01-10 NOTE — Evaluation (Signed)
Physical Therapy Evaluation Patient Details Name: [name] MRN: [id number] DOB: 11/05/1940 Today's Date: 01/10/2020   History of Present Illness  [name] is a 79 y.o. female with PMH of ESRD on HD MWF, IDT2DM, history of CVA, HTN who wasadmitted on 01/05/2020 for right foot cellulitis and has been on Vanc/Zosyn. On 7/26  patient was noted to have generalized shaking concerning for seizure x2, EEG WNL.  Clinical Impression  Pt admitted with above diagnosis. Pt presents with LE weakness, notable with sit>stand, needed mod A for power up. Once up, pt was able to ambulate to recliner with use of RW and min A, distance limited by RLE discomfort. Pt would benefit from HHPT at d/c to return to PLOF.  Pt currently with functional limitations due to the deficits listed below (see PT Problem List). Pt will benefit from skilled PT to increase their independence and safety with mobility to allow discharge to the venue listed below.       Follow Up Recommendations Home health PT;Supervision for mobility/OOB    Equipment Recommendations  None recommended by PT    Recommendations for Other Services       Precautions / Restrictions Precautions Precautions: Fall Restrictions Weight Bearing Restrictions: No      Mobility  Bed Mobility Overal bed mobility: Needs Assistance Bed Mobility: Supine to Sit     Supine to sit: Min assist     General bed mobility comments: min HHA to elevate trunk into sitting  Transfers Overall transfer level: Needs assistance Equipment used: Rolling walker (2 wheeled) Transfers: Sit to/from Stand Sit to Stand: Mod assist         General transfer comment: mod A for power up from bed  Ambulation/Gait Ambulation/Gait assistance: Min assist Gait Distance (Feet): 3 Feet Assistive device: Rolling walker (2 wheeled) Gait Pattern/deviations: Step-to pattern;Decreased weight shift to right;Decreased stance time - right Gait velocity:  decreased Gait velocity interpretation: <1.31 ft/sec, indicative of household ambulator General Gait Details: limited distance due to R foot discomfort  Stairs            Wheelchair Mobility    Modified Rankin (Stroke Patients Only)       Balance Overall balance assessment: Needs assistance Sitting-balance support: Feet supported;No upper extremity supported Sitting balance-Leahy Scale: Good     Standing balance support: Bilateral upper extremity supported;During functional activity Standing balance-Leahy Scale: Poor Standing balance comment: reliant on UE support to stand                             Pertinent Vitals/Pain Pain Assessment: Faces Faces Pain Scale: Hurts little more Pain Location: R foot Pain Descriptors / Indicators: Dull Pain Intervention(s): Limited activity within patient's tolerance;Monitored during session    Home Living Family/patient expects to be discharged to:: Private residence Living Arrangements: Children Available Help at Discharge: Family;Available 24 hours/day Type of Home: House Home Access: Stairs to enter Entrance Stairs-Rails: None Entrance Stairs-Number of Steps: 4 Home Layout: One level Home Equipment: Cane - single point;Walker - 2 wheels;Shower seat Additional Comments: pt lives with her brothers but they work 3rd shift so daughter comes in to be with her then and also takes her to HD    Prior Function Level of Independence: Needs assistance   Gait / Transfers Assistance Needed: ambulates with RW  ADL's / Homemaking Assistance Needed: family assists with home mgmt        Hand Dominance  Dominant Hand: Right    Extremity/Trunk Assessment   Upper Extremity Assessment Upper Extremity Assessment: Generalized weakness    Lower Extremity Assessment Lower Extremity Assessment: Generalized weakness    Cervical / Trunk Assessment Cervical / Trunk Assessment: Normal  Communication   Communication: No  difficulties  Cognition Arousal/Alertness: Awake/alert Behavior During Therapy: WFL for tasks assessed/performed Overall Cognitive Status: Within Functional Limits for tasks assessed                                 General Comments: WFL for basic conversation, oriented x4      General Comments General comments (skin integrity, edema, etc.): pt reports that she did have a boot for RLE but it was heavy and made it difficult for her to walk and MD instructed her to stop wearing it    Exercises General Exercises - Lower Extremity Ankle Circles/Pumps: AROM;Both Quad Sets: AROM;Both;10 reps;Seated Gluteal Sets: AROM;Both;10 reps;Seated   Assessment/Plan    PT Assessment Patient needs continued PT services  PT Problem List Decreased strength;Decreased activity tolerance;Decreased balance;Decreased mobility;Pain;Impaired sensation       PT Treatment Interventions DME instruction;Gait training;Stair training;Functional mobility training;Therapeutic activities;Therapeutic exercise;Balance training;Patient/family education    PT Goals (Current goals can be found in the Care Plan section)  Acute Rehab PT Goals Patient Stated Goal: return home PT Goal Formulation: With patient Time For Goal Achievement: 01/24/20 Potential to Achieve Goals: Good    Frequency Min 3X/week   Barriers to discharge        Co-evaluation               AM-PAC PT "6 Clicks" Mobility  Outcome Measure Help needed turning from your back to your side while in a flat bed without using bedrails?: A Little Help needed moving from lying on your back to sitting on the side of a flat bed without using bedrails?: A Little Help needed moving to and from a bed to a chair (including a wheelchair)?: A Little Help needed standing up from a chair using your arms (e.g., wheelchair or bedside chair)?: A Lot Help needed to walk in hospital room?: A Little Help needed climbing 3-5 steps with a railing? : A  Lot 6 Click Score: 16    End of Session Equipment Utilized During Treatment: Gait belt Activity Tolerance: Patient limited by pain Patient left: in chair;with call bell/phone within reach;with chair alarm set Nurse Communication: Mobility status PT Visit Diagnosis: Muscle weakness (generalized) (M62.81);Pain;Difficulty in walking, not elsewhere classified (R26.2) Pain - Right/Left: Right Pain - part of body: Ankle and joints of foot    Time: 0076-2263 PT Time Calculation (min) (ACUTE ONLY): 15 min   Charges:   PT Evaluation $PT Eval Moderate Complexity: [place]  Pager [phone] Office [place] 01/10/2020, 1:14 PM

## 2020-01-10 NOTE — Plan of Care (Signed)
  Problem: Clinical Measurements: Goal: Ability to avoid or minimize complications of infection will improve Outcome: Progressing   

## 2020-01-10 NOTE — TOC Initial Note (Signed)
Transition of Care [facility]) - Initial/Assessment Note    Patient Details  Name: [name] MRN: [id number] Date of Birth: Jun 24, 1940  [facility]) CM/SW Contact:    Marilu Favre, RN Phone Number: 01/10/2020, 2:01 PM  Clinical Narrative:                 Confirmed face sheet information with patient at bedside. Patient lives with her daughter. Her sons are with her through the day while her daughter works.   Patient has a walker at home already.   Discussed PT recommendation for home health PT. Patient in agreement. Provided medicare.gov list of home health agencies. Patient will discuss with her daughter.   NCM will follow up with patient in the morning.   Expected Discharge Plan: [place] Barriers to Discharge: Continued Medical Work up   Patient Goals and CMS Choice Patient states their goals for this hospitalization and ongoing recovery are:: to return to home CMS Medicare.gov Compare Post Acute Care list provided to:: Patient Choice offered to / list presented to : Patient  Expected Discharge Plan and Services Expected Discharge Plan: [place]   Discharge Planning Services: CM Consult Post Acute Care Choice: [place] arrangements for the past 2 months: Single Family Home                 DME Arranged: N/A DME Agency: NA       HH Arranged: PT          Prior Living Arrangements/Services Living arrangements for the past 2 months: Single Family Home Lives with:: Adult Children Patient language and need for interpreter reviewed:: Yes        Need for Family Participation in Patient Care: Yes (Comment) Care giver support system in place?: Yes (comment) Current home services: DME Criminal Activity/Legal Involvement Pertinent to Current Situation/Hospitalization: No - Comment as needed  Activities of Daily Living      Permission Sought/Granted   Permission granted to share information with :  No              Emotional Assessment Appearance:: Appears stated age Attitude/Demeanor/Rapport: Engaged Affect (typically observed): Accepting Orientation: : Oriented to Self, Oriented to Place, Oriented to  Time, Oriented to Situation Alcohol / Substance Use: Not Applicable Psych Involvement: No (comment)  Admission diagnosis:  Cellulitis of right foot [L03.115] Sepsis due to cellulitis (HCC) [L03.90, A41.9] Patient Active Problem List   Diagnosis Date Noted  . Cellulitis of right foot 01/05/2020  . History of CVA (cerebrovascular accident) 01/05/2020  . Thrombocytopenia ([place]) 01/05/2020  . Diabetic ulcer of right foot associated with diabetes mellitus due to underlying condition ([place]) 01/05/2020  . History of colonic polyps 04/01/2018  . Acute bronchitis 10/15/2017  . DM type 2, goal A1c below 7   . HCAP (healthcare-associated pneumonia) 05/29/2015  . Hyperkalemia 05/29/2015  . Diabetes mellitus with complication ([place])   . ESRD (end stage renal disease) on dialysis ([place])   . Encounter for adequacy testing for hemodialysis ([place]) 01/17/2014  . Mechanical complication of other vascular device, implant, and graft 01/17/2014  . Disturbances of vision, late effect of stroke 11/03/2013  . Right hemiparesis ([place]) 08/19/2013  . CVA (cerebral infarction) 07/20/2013  . Gastroparesis due to DM ([place]) 07/01/2013  . End stage renal disease ([place]) 06/25/2013  . Anemia 01/26/2013  . DM W/NEURO MNFST, TYPE II, UNCONTROLLED 11/19/2006  . Leukocytosis 11/19/2006  . DEPENDENT EDEMA, LEGS, BILATERAL  11/19/2006  . SINUS TACHYCARDIA 10/16/2006  . Diabetes mellitus, type 2 ([place]) 05/20/2006  . HLD (hyperlipidemia) 05/20/2006  . SYNDROME, RESTLESS LEGS 05/20/2006  . PERIPHERAL NEUROPATHY 05/20/2006  . Hypertension associated with diabetes ([place]) 05/20/2006   PCP:  [name], MD Pharmacy:   [id number] - [place], [address] [address] [place]  [place] [address] Phone: [phone] Fax: [phone]  [facility] - [place], [place] - [address] [place] [address] Phone: [phone] Fax: [phone]     Social Determinants of Health (SDOH) Interventions    Readmission Risk Interventions No flowsheet data found.

## 2020-01-10 NOTE — Progress Notes (Addendum)
PROGRESS NOTE  [name]:[id number] DOB: 12/14/1940 DOA: 01/05/2020 PCP: [name], MD  HPI/Recap of past 24 hours: [name] is a 79 y.o. female with medical history significant for ESRD on HD MWF, IDT2DM, history of CVA, seizure disorder not taking AEDs, HTN, who presents to [facility] ED for evaluation of right foot cellulitis.  Follows with her podiatrist regularly for management of an ulcer on the plantar surface of the right foot at the base of the first MTP.  She had been wearing a boot for several weeks and noticed some swelling in her right leg since then.  She went to see her podiatrist on 01/05/2020 who felt she was developing cellulitis of the right foot.  She was subsequently sent to the ED for further evaluation and management.  In the ED, Temp 99.9 Fahrenheit, WBC 23.6K, hemoglobin 12.4K, platelets 83,000 (171,000 previously on 05/15/2019), lactic acid 1.4, sodium 136, potassium 4.6, bicarb 27, BUN 26, creatinine 6.66.  SARS-CoV-2 PCR negative.  Blood cultures x2 negative to date.  Right foot x-ray shows soft tissue swelling with ulcer/wound along plantar aspect of the foot at the level of the first MTP.  No definite acute osseous abnormality seen.  MRI right foot negative for osteomyelitis.  Poor dorsalis pedis pulses.  [place] ABI, B/L + PAD.  Daughter in the room requests referral to a different podiatrist.  TOC consulted to assist with this request.  Hospital course complicated by possible seizure activity, the morning of 01/09/2020, for which an EEG was done and neurology was consulted.  Discussed with neurology, Dr, Hortense Ramal.  She recommended continue home dose Keppra 250 mg twice daily and follow-up with neurology outpatient if MRI unremarkable for any acute intracranial findings.  No reported recurrence of seizure-like activities.  Continue seizure precautions  01/10/20: Seen and examined.  Hypoglycemic this morning, blood sugar corrected at the time of this visit.   She is alert, pleasant and she has no new complaints.   Assessment/Plan: Principal Problem:   Cellulitis of right foot Active Problems:   Diabetes mellitus, type 2 (HCC)   Leukocytosis   Hypertension associated with diabetes ([place])   ESRD (end stage renal disease) on dialysis ([place])   History of CVA (cerebrovascular accident)   Thrombocytopenia ([place])   Diabetic ulcer of right foot associated with diabetes mellitus due to underlying condition ([place])  Sepsis, improving, secondary to cellulitis of right foot associated with diabetic ulcer, osteomyelitis ruled out. Presented with leukocytosis 20K, tachycardia, tachypnea, cellulitis distal dorsal right foot on Xray and MRI.  Has plantar diabetic ulcer at base of first MTP without obvious drainage.  ESR 42  and CRP 20 No evidence of DVT on bilateral Doppler ultrasound No evidence of osteomyelitis on MRI right foot. Leukocytosis,improving , WBC 20 K> 15K >12K> 11.2K> 9.8K Initially received Rocephin, then switched to IV Zosyn and IV vancomycin Started Augmentin on 01/10/2020 Renally dosed by pharmacy Continue pain control  Seizure disorder Generalized convulsions 7/27 AM.  Per her daughter ceased to take her AED Keppra about 1 year ago, did not return to neuro for refills.  EEG ordered, no epileptiform activity MRI brain ordered, results are pending.  Follow results. Continue Keppra to 50 mg twice daily as recommended by neurology  Discussed with neurology, Dr, Hortense Ramal.  She recommended continue home dose Keppra 250 mg twice daily and follow-up with neurology outpatient if MRI unremarkable for any acute intracranial findings.  We will then discharge from a neurological standpoint.  No reported  recurrence of seizure activities.  Continue seizure precautions.  Peripheral arterial disease seen on bilateral lower extremity vascular ultrasound ABI done on 01/08/20, showing mild right lower extremity arterial disease, moderate left lower extremity  arterial disease. Continue aspirin, 162 mg daily Possibly not on statin due to ESRD. Will need to follow-up with a cardiologist  History of CVA: Continue home aspirin 162 mg daily, monitor platelet counts as above.  Not currently on statin therapy possibly due to ESRD.  Acute hyperkalemia, resolved with hemodialysis, in the setting of ESRD Serum potassium 5.8> 4.5> 5.2> 3.4 Electrolytes addressed by hemodialysis. Had hemodialysis on 01/06/2020 and 01/09/2020.  Mild hypovolemic hyponatremia Volume status managed with hemodialysis Serum sodium 134  Bilateral lower extremity edema, resolved post hemodialysis, secondary to volume overload on exam Presented with 1+ pitting edema in lower extremities bilaterally, has improved after hemodialysis. No evidence of DVT on bilateral Doppler ultrasound Advised to elevate her legs when at rest  ESRD on HD MWF: Seen by Dr. Candiss Norse, appreciate nephrology's assistance. Had hemodialysis on 01/06/2020 and 01/09/2020.   Thrombocytopenia secondary to acute illness, improving: No evidence of bleeding Plt 95K, up trending> 98K>113K> 132K> 1.9K  Insulin-dependent type 2 diabetes, with hypoglycemia : A1C 7.2 on 01/05/20 Hypoglycemic AM of 7/26 Novolin dose reduced to 5 units twice daily to avoid hypoglycemia Continue insulin sliding scale, sensitive.   Continue to avoid hypoglycemia.  Hypertension: Blood pressure is at goal Continue home labetalol.  Hydralazine as needed. Continue to monitor BP  Vitamin B12 deficiency Continue monthly B12 IM injection after hemodialysis 01/09/2020.   DVT prophylaxis:  Subcu heparin 3 times daily Code Status: Full code, confirmed with patient Family Communication:  Updated her daughter at bedside  Consults called: Nephrology   Status is: Inpatient    Dispo: The patient is from: Home.               Anticipated d/c is to: Home.               Anticipated d/c date is: 01/11/2020              Patient  currently Not medically stable due to persistent leukocytosis and ongoing treatment for sepsis secondary to right foot cellulitis with IV antibiotics.        Objective: Vitals:   01/09/20 2013 01/09/20 2115 01/10/20 0427 01/10/20 0521  BP:  (!) 126/41  (!) 128/52  Pulse:  76  69  Resp: _0 Temp:  99.2 F (37.3 C)  98.4 F (36.9 C)  TempSrc:  Oral  Oral  SpO2:  98%  98%  Weight:   80.6 kg   Height:        Intake/Output Summary (Last 24 hours) at 01/10/2020 1442 Last data filed at 01/10/2020 0900 Gross per 24 hour  Intake 360 ml  Output 1200 ml  Net -840 ml   Filed Weights   01/09/20 1400 01/09/20 1814 01/10/20 0427  Weight: 81.4 kg 80.1 kg 80.6 kg    Exam:  . General: 79 y.o. year-old female pleasant well-developed well-nourished no acute distress.  Alert and interactive.   . Cardiovascular: Regular rate and rhythm no rubs or gallops. Marland Kitchen Respiratory: Clear to auscultation no wheezes or rales. . Abdomen: Soft nontender normal bowel sounds present. . Musculoskeletal: Right foot in surgical dressing.  Lower extremities edema improved, less edematous. Marland Kitchen Psychiatry: Mood is appropriate for condition and setting.  Data Reviewed: CBC: Recent Labs  Lab 01/06/20 0906 01/06/20 1827 01/07/20  4132 01/08/20 0201 01/09/20 0430 01/09/20 1450 01/10/20 0352  WBC 20.2*   < > 15.4* 12.4* 11.3* 11.2* 9.8  NEUTROABS 17.0*  --  12.4* 9.3* 8.6*  --  6.9  HGB 11.4*   < > 11.1* 11.3* 10.9* 10.4* 11.8*  HCT 34.7*   < > 32.8* 33.0* 31.6* 30.3* 34.1*  MCV 94.3   < > 90.6 91.7 90.5 89.4 90.7  PLT 95*   < > 98* 113* 145* 132* 129*   < > = values in this interval not displayed.   Basic Metabolic Panel: Recent Labs  Lab 01/06/20 0411 01/06/20 0411 01/06/20 1827 01/07/20 0752 01/08/20 0201 01/09/20 0430 01/10/20 0352  NA 136   < > 136 135 133* 133* 134*  K 5.8*   < > 4.1 4.3 4.5 5.2* 3.4*  CL 99   < > 94* 96* 94* 95* 95*  CO2 24   < > _0 21* 28  GLUCOSE 127*   < >  124* 198* 187* 149* 137*  BUN 37*   < > 17 28* 43* 61* 25*  CREATININE 7.65*   < > 4.53* 5.77* 7.33* 9.42* 5.44*  CALCIUM 8.4*   < > 8.9 8.3* 8.2* 8.0* 8.1*  PHOS 4.6  --  3.2  --   --   --   --    < > = values in this interval not displayed.   GFR: Estimated Creatinine Clearance: 9.2 mL/min (A) (by C-G formula based on SCr of 5.44 mg/dL (H)). Liver Function Tests: Recent Labs  Lab 01/05/20 1535 01/06/20 0411 01/06/20 1827  AST 19  --   --   ALT 18  --   --   ALKPHOS 78  --   --   BILITOT 1.2  --   --   PROT 7.8  --   --   ALBUMIN 3.4* 2.8* 2.9*   No results for input(s): LIPASE, AMYLASE in the last 168 hours. No results for input(s): AMMONIA in the last 168 hours. Coagulation Profile: Recent Labs  Lab 01/05/20 1830  INR 1.4*   Cardiac Enzymes: No results for input(s): CKTOTAL, CKMB, CKMBINDEX, TROPONINI in the last 168 hours. BNP (last 3 results) No results for input(s): PROBNP in the last 8760 hours. HbA1C: No results for input(s): HGBA1C in the last 72 hours. CBG: Recent Labs  Lab 01/09/20 2112 01/10/20 0752 01/10/20 0818 01/10/20 0828 01/10/20 1207  GLUCAP 231* 62* 68* 95 123*   Lipid Profile: No results for input(s): CHOL, HDL, LDLCALC, TRIG, CHOLHDL, LDLDIRECT in the last 72 hours. Thyroid Function Tests: No results for input(s): TSH, T4TOTAL, FREET4, T3FREE, THYROIDAB in the last 72 hours. Anemia Panel: No results for input(s): VITAMINB12, FOLATE, FERRITIN, TIBC, IRON, RETICCTPCT in the last 72 hours. Urine analysis:    Component Value Date/Time   COLORURINE YELLOW 10/15/2017 2244   APPEARANCEUR HAZY (A) 10/15/2017 2244   LABSPEC 1.015 10/15/2017 2244   PHURINE 7.0 10/15/2017 2244   GLUCOSEU >=500 (A) 10/15/2017 2244   HGBUR SMALL (A) 10/15/2017 2244   BILIRUBINUR NEGATIVE 10/15/2017 2244   KETONESUR NEGATIVE 10/15/2017 2244   PROTEINUR 100 (A) 10/15/2017 2244   UROBILINOGEN 0.2 11/19/2014 0909   NITRITE NEGATIVE 10/15/2017 2244   LEUKOCYTESUR  MODERATE (A) 10/15/2017 2244   Sepsis Labs: _1 (procalcitonin:4,lacticidven:4)  ) Recent Results (from the past 240 hour(s))  Blood Culture (routine x 2)     Status: None   Collection Time: 01/05/20  6:30 PM   Specimen: BLOOD  Result Value Ref Range Status   Specimen Description BLOOD RIGHT ANTECUBITAL  Final   Special Requests   Final    BOTTLES DRAWN AEROBIC AND ANAEROBIC Blood Culture adequate volume   Culture   Final    NO GROWTH 5 DAYS Performed at [facility], [address] [place], [place] [address]    Report Status 01/10/2020 FINAL  Final  SARS Coronavirus 2 by RT PCR (hospital order, performed in [facility] hospital lab) Nasopharyngeal Nasopharyngeal Swab     Status: None   Collection Time: 01/05/20  6:30 PM   Specimen: Nasopharyngeal Swab  Result Value Ref Range Status   SARS Coronavirus 2 NEGATIVE NEGATIVE Final    Comment: (NOTE) SARS-CoV-2 target nucleic acids are NOT DETECTED.  The SARS-CoV-2 RNA is generally detectable in upper and lower respiratory specimens during the acute phase of infection. The lowest concentration of SARS-CoV-2 viral copies this assay can detect is 250 copies / mL. A negative result does not preclude SARS-CoV-2 infection and should not be used as the sole basis for treatment or other patient management decisions.  A negative result may occur with improper specimen collection / handling, submission of specimen other than nasopharyngeal swab, presence of viral mutation(s) within the areas targeted by this assay, and inadequate number of viral copies (<250 copies / mL). A negative result must be combined with clinical observations, patient history, and epidemiological information.  Fact Sheet for Patients:   StrictlyIdeas.no  Fact Sheet for Healthcare Providers: BankingDealers.co.za  This test is not yet approved or  cleared by the [place] FDA and has been authorized  for detection and/or diagnosis of SARS-CoV-2 by FDA under an Emergency Use Authorization (EUA).  This EUA will remain in effect (meaning this test can be used) for the duration of the COVID-19 declaration under Section 564(b)(1) of the Act, 21 U.S.C. section 360bbb-3(b)(1), unless the authorization is terminated or revoked sooner.  Performed at [facility], [place] [address], [place], [place] [address]   Blood Culture (routine x 2)     Status: None   Collection Time: 01/05/20  7:04 PM   Specimen: BLOOD LEFT HAND  Result Value Ref Range Status   Specimen Description BLOOD LEFT HAND  Final   Special Requests   Final    BOTTLES DRAWN AEROBIC AND ANAEROBIC Blood Culture results may not be optimal due to an inadequate volume of blood received in culture bottles   Culture   Final    NO GROWTH 5 DAYS Performed at [facility], [place] [address], [place], [place] [address]    Report Status 01/10/2020 FINAL  Final  MRSA PCR Screening     Status: None   Collection Time: 01/08/20  5:48 AM   Specimen: Nasal Mucosa; Nasopharyngeal  Result Value Ref Range Status   MRSA by PCR NEGATIVE NEGATIVE Final    Comment:        The GeneXpert MRSA Assay (FDA approved for NASAL specimens only), is one component of a comprehensive MRSA colonization surveillance program. It is not intended to diagnose MRSA infection nor to guide or monitor treatment for MRSA infections. Performed at [facility], [place] [address] [place], [place] [address]       Studies: No results found.  Scheduled Meds: . amoxicillin-clavulanate  1 tablet Oral q1800  . aspirin  162 mg Oral Daily  . Chlorhexidine Gluconate Cloth  6 each Topical Q0600  . cyanocobalamin  1,000 mcg Intramuscular Q30  days  . doxycycline  100 mg Oral Q12H  . gabapentin  300 mg Oral Daily  . heparin injection (subcutaneous)  5,000 Units Subcutaneous Q8H  . insulin aspart  0-6 Units Subcutaneous TID WC  . insulin NPH Human  5 Units  Subcutaneous BID AC  . labetalol  100 mg Oral BID  . levETIRAcetam  250 mg Oral BID  . multivitamin  1 tablet Oral QHS    Continuous Infusions:    LOS: 4 days     Kayleen Memos, MD [facility] Pager [phone]  If 7PM-7AM, please contact night-coverage [email or web address] Password [email or web address] 01/10/2020, 2:42 PM

## 2020-01-10 NOTE — Progress Notes (Signed)
Inpatient Diabetes Program Recommendations  AACE/ADA: New Consensus Statement on Inpatient Glycemic Control  Target Ranges:  Prepandial:   less than 140 mg/dL      Peak postprandial:   less than 180 mg/dL (1-2 hours)      Critically ill patients:  140 - 180 mg/dL  Results for [name], [name] (MRN [id number]) as of 01/10/2020 12:03  Ref. Range 01/10/2020 07:52 01/10/2020 08:18 01/10/2020 08:28  Glucose-Capillary Latest Ref Range: 70 - 99 mg/dL 62 (L) 68 (L) 95   Results for [name], [name] (MRN [id number]) as of 01/10/2020 12:03  Ref. Range 01/09/2020 07:30 01/09/2020 12:23 01/09/2020 18:45 01/09/2020 21:12  Glucose-Capillary Latest Ref Range: 70 - 99 mg/dL 95 100 (H) 131 (H) 231 (H)   Review of Glycemic Control  Diabetes history: DM2 Outpatient Diabetes medications: NPH 14 units BID Current orders for Inpatient glycemic control: NPH 14 units BID, Novolog 0-6 units TID with meals  Inpatient Diabetes Program Recommendations:    Insulin-Basal: Fasting glucose 62 mg/dl this morning. Please consider decreasing NPH slightly to 12 units BID.  Thanks, Barnie Alderman, RN, MSN, CDE Diabetes Coordinator Inpatient Diabetes Program [phone] (Team Pager from 8am to 5pm)

## 2020-01-10 NOTE — Progress Notes (Signed)
[facility] Progress Note    Assessment/ Plan:   1. ESRD on hemodialysis  1. Monday Wednesday Friday ([place]). RUE AVF  Outpatient orders: 4 hours, revaclear300, BFR 400/dfr 600. 3k, 2.5cal, 138na, 35bicarb. No heparin.  2. HD on MWF schedule: 3K, 4h, 2-3L UF, no heparin, AVF 2. Anemia of chronic kidney disease  hgb stable  receives epogen 4k units outpatient (holding) 3. Secondary hyperparathyroidism; phos ok, recevies hectorol 3.39mcg w/ treatments 4. Sepsis secondary to cellulitis of right foot associate with diabetic ulcer 1. Osteomyelitis ruled out 2. Now no augmentin/doxy 3. Management per primary service 5. HTN/volume status, bp at [facility] 1. Probe down post HD weights as able, BP ok   [name]  [facility]  Subjective:   No c/o, in chair, feels good Neg EEG HD yesterday 1.2L UF   Objective:   BP (!) 128/52 (BP Location: Left Arm)   Pulse 69   Temp 98.4 F (36.9 C) (Oral)   Resp 16   Ht 5\' 8"  (1.727 m)   Wt 80.6 kg   SpO2 98%   BMI 27.02 kg/m   Intake/Output Summary (Last 24 hours) at 01/10/2020 1502 Last data filed at 01/10/2020 0900 Gross per 24 hour  Intake 360 ml  Output 1200 ml  Net -840 ml   Weight change:   Physical Exam: Gen: No acute distress, nontoxic appearing CVS: Regular rate, S1-S2 Resp: Clear to auscultation bilaterally, normal work of breathing, no wheezes/rhonchi/rales/crackles, bilateral chest expansion Abd: Soft, nontender/nondistended Ext: Right upper extremity AVF with good bruit and thrill, right foot with erythema and warmth Neuro: No focal deficits, aaox3   Imaging: EEG adult  Result Date: 01/09/2020 Lora Havens, MD     01/09/2020 11:14 AM Patient Name: [name] MRN: [id number] Epilepsy Attending: Lora Havens Referring Physician/Provider: Dr Irene Pap Date: 01/09/2020 Duration: 25.28 mins Patient history: 79 Yo F with sepsis.  EEG to evaluate for seizures. Level  of alertness: Awake,asleep AEDs during EEG study: None Technical aspects: This EEG study was done with scalp electrodes positioned according to the 10-20 International system of electrode placement. Electrical activity was acquired at a sampling rate of 500Hz  and reviewed with a high frequency filter of 70Hz  and a low frequency filter of 1Hz . EEG data were recorded continuously and digitally stored. Description: The posterior dominant rhythm consists of 9 Hz activity of moderate voltage (25-35 uV) seen predominantly in posterior head regions, symmetric and reactive to eye opening and eye closing. Sleep was characterized by vertex waves, sleep spindles (12 to 14 Hz), maximal frontocentral region. Hyperventilation and photic stimulation were not performed.   IMPRESSION: This study is within normal limits. No seizures or epileptiform discharges were seen throughout the recording. [place]: BMET Recent Labs  Lab 01/05/20 1535 01/06/20 0411 01/06/20 1827 01/07/20 0752 01/08/20 0201 01/09/20 0430 01/10/20 0352  NA 136 136 136 135 133* 133* 134*  K 4.6 5.8* 4.1 4.3 4.5 5.2* 3.4*  CL 97* 99 94* 96* 94* 95* 95*  CO2 27 24 27 28 26  21* 28  GLUCOSE 144* 127* 124* 198* 187* 149* 137*  BUN 26* 37* 17 28* 43* 61* 25*  CREATININE 6.66* 7.65* 4.53* 5.77* 7.33* 9.42* 5.44*  CALCIUM 9.0 8.4* 8.9 8.3* 8.2* 8.0* 8.1*  PHOS  --  4.6 3.2  --   --   --   --    CBC Recent Labs  Lab 01/07/20 0752 01/07/20 0752  01/08/20 0201 01/09/20 0430 01/09/20 1450 01/10/20 0352  WBC 15.4*   < > 12.4* 11.3* 11.2* 9.8  NEUTROABS 12.4*  --  9.3* 8.6*  --  6.9  HGB 11.1*   < > 11.3* 10.9* 10.4* 11.8*  HCT 32.8*   < > 33.0* 31.6* 30.3* 34.1*  MCV 90.6   < > 91.7 90.5 89.4 90.7  PLT 98*   < > 113* 145* 132* 129*   < > = values in this interval not displayed.    Medications:    . amoxicillin-clavulanate  1 tablet Oral q1800  . aspirin  162 mg Oral Daily  . Chlorhexidine Gluconate Cloth  6 each  Topical Q0600  . cyanocobalamin  1,000 mcg Intramuscular Q30 days  . doxycycline  100 mg Oral Q12H  . gabapentin  300 mg Oral Daily  . heparin injection (subcutaneous)  5,000 Units Subcutaneous Q8H  . insulin aspart  0-6 Units Subcutaneous TID WC  . insulin NPH Human  5 Units Subcutaneous BID AC  . labetalol  100 mg Oral BID  . levETIRAcetam  250 mg Oral BID  . multivitamin  1 tablet Oral QHS

## 2020-01-10 NOTE — Progress Notes (Addendum)
Subjective: No acute events overnight.  No further seizure-like activity.  Patient denies any side effects on Keppra.  States she is feeling well but reporting pain in her right foot.  States she occasionally has episodes where her whole body shakes last 1 being yesterday.  Denies ever being on seizure medications but also states that her daughter takes care of all her medication so she may be unaware if she ever took them.  ROS: negative except above  Examination  Vital signs in last 24 hours: Temp:  [97.7 F (36.5 C)-99.2 F (37.3 C)] 98.4 F (36.9 C) (07/27 0521) Pulse Rate:  [63-91] 69 (07/27 0521) Resp:  [12-19] 16 (07/27 0521) BP: (126-174)/(41-73) 128/52 (07/27 0521) SpO2:  [98 %] 98 % (07/27 0521) Weight:  [80.1 kg-80.6 kg] 80.6 kg (07/27 0427)  General: Sitting in recliner, not in apparent distress CVS: pulse-normal rate and rhythm RS: breathing comfortably, CTAB Extremities: normal, warm  Neuro: MS: Alert, oriented, follows commands CN: pupils equal and reactive,  EOMI, face symmetric, tongue midline, normal sensation over face Motor: 5/5 strength in all 4 extremities  Basic Metabolic Panel: Recent Labs  Lab 01/06/20 0411 01/06/20 0411 01/06/20 1827 01/06/20 1827 01/07/20 0752 01/07/20 0752 01/08/20 0201 01/09/20 0430 01/10/20 0352  NA 136   < > 136  --  135  --  133* 133* 134*  K 5.8*   < > 4.1  --  4.3  --  4.5 5.2* 3.4*  CL 99   < > 94*  --  96*  --  94* 95* 95*  CO2 24   < > 27  --  28  --  26 21* 28  GLUCOSE 127*   < > 124*  --  198*  --  187* 149* 137*  BUN 37*   < > 17  --  28*  --  43* 61* 25*  CREATININE 7.65*   < > 4.53*  --  5.77*  --  7.33* 9.42* 5.44*  CALCIUM 8.4*   < > 8.9   < > 8.3*   < > 8.2* 8.0* 8.1*  PHOS 4.6  --  3.2  --   --   --   --   --   --    < > = values in this interval not displayed.    CBC: Recent Labs  Lab 01/06/20 0906 01/06/20 1827 01/07/20 0752 01/08/20 0201 01/09/20 0430 01/09/20 1450 01/10/20 0352  WBC 20.2*    < > 15.4* 12.4* 11.3* 11.2* 9.8  NEUTROABS 17.0*  --  12.4* 9.3* 8.6*  --  6.9  HGB 11.4*   < > 11.1* 11.3* 10.9* 10.4* 11.8*  HCT 34.7*   < > 32.8* 33.0* 31.6* 30.3* 34.1*  MCV 94.3   < > 90.6 91.7 90.5 89.4 90.7  PLT 95*   < > 98* 113* 145* 132* 129*   < > = values in this interval not displayed.     Coagulation Studies: No results for input(s): LABPROT, INR in the last 72 hours.  Imaging MRI brain without contrast ordered and pending   ASSESSMENT AND PLAN:  79 year old female with multiple comorbidities admitted for management of right foot cellulitis and was noted to have seizure like episodes x2.   Breakthrough seizure Encephalopathy (resolved) -Patient was started on Keppra 250 mg twice daily yesterday, has not had any further seizure-like episodes overnight. -Spoke with patient's daughter who states patient had whole body shaking seizure-like episode about 3 years ago  in church and was seen by neurologist and started on [place].  She did not have any further seizures and eventually ran out of medication and stopped taking it until this admission.  Per daughter during the episode patient suddenly put her head down and had whole body shaking followed by "disorientation".  Denies any other seizure-like episodes in the past  Recommendations -Routine EEG did not show any evidence of seizure potential epileptogenic city -MRI brain without contrast ordered and pending to look for any acute abnormalities.  -Continue Keppra 250 mg twice daily. -Continue seizure precautions -Follow-up with neurology in 8 to 12 weeks after discharge plan.  Patient's daughter states they saw Dr. Merlene Laughter in the past.  Seizure precautions: Per [facility] statutes, patients with seizures are not allowed to drive until they have been seizure-free for six months and cleared by a physician    Use caution when using heavy equipment or power tools. Avoid working on ladders or at heights. Take showers  instead of baths. Ensure the water temperature is not too high on the home water heater. Do not go swimming alone. Do not lock yourself in a room alone (i.e. bathroom). When caring for infants or small children, sit down when holding, feeding, or changing them to minimize risk of injury to the child in the event you have a seizure. Maintain good sleep hygiene. Avoid alcohol.    If patient has another seizure, call 911 and bring them back to the ED if: A.  The seizure lasts longer than 5 minutes.      B.  The patient doesn't wake shortly after the seizure or has new problems such as difficulty seeing, speaking or moving following the seizure C.  The patient was injured during the seizure D.  The patient has a temperature over 102 F (39C) E.  The patient vomited during the seizure and now is having trouble breathing    During the Seizure   - First, ensure adequate ventilation and place patients on the floor on their left side  Loosen clothing around the neck and ensure the airway is patent. If the patient is clenching the teeth, do not force the mouth open with any object as this can cause severe damage - Remove all items from the surrounding that can be hazardous. The patient may be oblivious to what's happening and may not even know what he or she is doing. If the patient is confused and wandering, either gently guide him/her away and block access to outside areas - Reassure the individual and be comforting - Call 911. In most cases, the seizure ends before EMS arrives. However, there are cases when seizures may last over 3 to 5 minutes. Or the individual may have developed breathing difficulties or severe injuries. If a pregnant patient or a person with diabetes develops a seizure, it is prudent to call an ambulance. - Finally, if the patient does not regain full consciousness, then call EMS. Most patients will remain confused for about 45 to 90 minutes after a seizure, so you must use judgment in  calling for help. - Avoid restraints but make sure the patient is in a bed with padded side rails - Place the individual in a lateral position with the neck slightly flexed; this will help the saliva drain from the mouth and prevent the tongue from falling backward - Remove all nearby furniture and other hazards from the area - Provide verbal assurance as the individual is regaining consciousness - Provide  the patient with privacy if possible - Call for help and start treatment as ordered by the caregiver    After the Seizure (Postictal Stage)   After a seizure, most patients experience confusion, fatigue, muscle pain and/or a headache. Thus, one should permit the individual to sleep. For the next few days, reassurance is essential. Being calm and helping reorient the person is also of importance.   Most seizures are painless and end spontaneously. Seizures are not harmful to others but can lead to complications such as stress on the lungs, brain and the heart. Individuals with prior lung problems may develop labored breathing and respiratory distress.     I have spent a total of 25   minutes with the patient reviewing hospital notes,  test results, labs and examining the patient as well as establishing an assessment and plan that was discussed personally with the patient.  > 50% of time was spent in direct patient care.    Zeb [facility] Epile[facility] For questions after 5pm please refer to AMION to reach the Neurologist on call

## 2020-01-11 ENCOUNTER — Encounter (HOSPITAL_COMMUNITY): Payer: Self-pay | Admitting: Internal Medicine

## 2020-01-11 DIAGNOSIS — L03115 Cellulitis of right lower limb: Secondary | ICD-10-CM | POA: Diagnosis not present

## 2020-01-11 DIAGNOSIS — Z8673 Personal history of transient ischemic attack (TIA), and cerebral infarction without residual deficits: Secondary | ICD-10-CM | POA: Diagnosis not present

## 2020-01-11 DIAGNOSIS — N186 End stage renal disease: Secondary | ICD-10-CM

## 2020-01-11 DIAGNOSIS — Z992 Dependence on renal dialysis: Secondary | ICD-10-CM | POA: Diagnosis not present

## 2020-01-11 LAB — GLUCOSE, CAPILLARY
Glucose-Capillary: 195 mg/dL — ABNORMAL HIGH (ref 70–99)
Glucose-Capillary: 270 mg/dL — ABNORMAL HIGH (ref 70–99)
Glucose-Capillary: 107 mg/dL — ABNORMAL HIGH (ref 70–99)
Glucose-Capillary: 249 mg/dL — ABNORMAL HIGH (ref 70–99)

## 2020-01-11 LAB — CBC
HCT: 31 % — ABNORMAL LOW (ref 36.0–46.0)
Hemoglobin: 10.7 g/dL — ABNORMAL LOW (ref 12.0–15.0)
MCH: 31.1 pg (ref 26.0–34.0)
MCHC: 34.5 g/dL (ref 30.0–36.0)
MCV: 90.1 fL (ref 80.0–100.0)
Platelets: 168 10*3/uL (ref 150–400)
RBC: 3.44 MIL/uL — ABNORMAL LOW (ref 3.87–5.11)
RDW: 14.7 % (ref 11.5–15.5)
WBC: 11 10*3/uL — ABNORMAL HIGH (ref 4.0–10.5)
nRBC: 0 % (ref 0.0–0.2)

## 2020-01-11 LAB — BASIC METABOLIC PANEL
Anion gap: 16 — ABNORMAL HIGH (ref 5–15)
BUN: 41 mg/dL — ABNORMAL HIGH (ref 8–23)
CO2: 24 mmol/L (ref 22–32)
Calcium: 8 mg/dL — ABNORMAL LOW (ref 8.9–10.3)
Chloride: 92 mmol/L — ABNORMAL LOW (ref 98–111)
Creatinine, Ser: 7.32 mg/dL — ABNORMAL HIGH (ref 0.44–1.00)
GFR calc Af Amer: 6 mL/min — ABNORMAL LOW (ref >60)
GFR calc non Af Amer: 5 mL/min — ABNORMAL LOW (ref >60)
Glucose, Bld: 226 mg/dL — ABNORMAL HIGH (ref 70–99)
Potassium: 3.9 mmol/L (ref 3.5–5.1)
Sodium: 132 mmol/L — ABNORMAL LOW (ref 135–145)

## 2020-01-11 MED ORDER — DOXERCALCIFEROL 4 MCG/2ML IV SOLN
INTRAVENOUS | Status: AC
  Filled 2020-01-11: qty 2

## 2020-01-11 MED ORDER — INSULIN NPH (HUMAN) (ISOPHANE) 100 UNIT/ML ~~LOC~~ SUSP
10.0000 [IU] | Freq: Two times a day (BID) | SUBCUTANEOUS | Status: DC
  Administered 2020-01-12 – 2020-01-16 (×7): 10 [IU] via SUBCUTANEOUS
  Filled 2020-01-11: qty 10

## 2020-01-11 NOTE — Progress Notes (Signed)
PT Cancellation Note  Patient Details Name: [name] MRN: [id number] DOB: Oct 22, 1940   Cancelled Treatment:    Reason Eval/Treat Not Completed: Patient at procedure or test/unavailable at HD, PT to check back tomorrow.   [place] Pager [phone]  Office [phone]    [place] 01/11/2020, 4:15 PM

## 2020-01-11 NOTE — Progress Notes (Signed)
Inpatient Diabetes Program Recommendations  AACE/ADA: New Consensus Statement on Inpatient Glycemic Control (2015)  Target Ranges:  Prepandial:   less than 140 mg/dL      Peak postprandial:   less than 180 mg/dL (1-2 hours)      Critically ill patients:  140 - 180 mg/dL   Lab Results  Component Value Date   GLUCAP 270 (H) 01/11/2020   HGBA1C 7.7 (H) 01/05/2020    Review of Glycemic Control Results for [name], [name] (MRN [id number]) as of 01/11/2020 14:09  Ref. Range 01/10/2020 20:02 01/11/2020 07:44 01/11/2020 11:58  Glucose-Capillary Latest Ref Range: 70 - 99 mg/dL 222 (H) 195 (H) 270 (H)  Diabetes history: DM2 Outpatient Diabetes medications: NPH 14 units BID Current orders for Inpatient glycemic control: NPH 5 units BID, Novolog 0-6 units TID with meals  Inpatient Diabetes Program Recommendations:    Note NPH reduced due to hypoglycemia on 7/27.  Blood sugars increased today.  Consider increasing NPH to 10 units bid.    Thanks  Adah Perl, RN, BC-ADM Inpatient Diabetes Coordinator Pager [phone] (8a-5p)

## 2020-01-11 NOTE — Progress Notes (Signed)
PROGRESS NOTE    [name]  FYB:[id number] DOB: January 23, 1941 DOA: 01/05/2020 PCP: [name], MD    Brief Narrative:  79 y.o.femalewith medical history significant forESRD on HD MWF, IDT2DM, history of CVA, seizure disorder not taking AEDs, HTN, who presents to [facility] ED for evaluation of right foot cellulitis. Follows with her podiatrist regularly for management of an ulcer on the plantar surface of the right foot at the base of the first MTP. She had been wearing a boot for several weeks and noticed some swelling in her right leg since then. She went to see her podiatrist on 01/05/2020 who felt she was developing cellulitis of the right foot. She was subsequently sent to the ED for further evaluation and management.  In the ED, Temp 99.9 Fahrenheit, WBC 23.6K, hemoglobin 12.4K, platelets 83,000 (171,000 previously on 05/15/2019), lactic acid 1.4, sodium 136, potassium 4.6, bicarb 27, BUN 26, creatinine 6.66.  SARS-CoV-2 PCR negative. Blood cultures x2 negative to date.  Right foot x-ray shows soft tissue swelling with ulcer/wound along plantar aspect of the foot at the level of the first MTP. No definite acute osseous abnormality seen.  MRI right foot negative for osteomyelitis.  Poor dorsalis pedis pulses.  [place] ABI, B/L + PAD.  Daughter in the room requests referral to a different podiatrist.  TOC consulted to assist with this request.  Hospital course complicated by possible seizure activity, the morning of 01/09/2020, for which an EEG was done and neurology was consulted.  Discussed with neurology, Dr, Hortense Ramal.  She recommended continue home dose Keppra 250 mg twice daily and follow-up with neurology outpatient if MRI unremarkable for any acute intracranial findings.  No reported recurrence of seizure-like activities.  Assessment & Plan:   Principal Problem:   Cellulitis of right foot Active Problems:   Diabetes mellitus, type 2 (HCC)   Leukocytosis   Hypertension  associated with diabetes ([place])   ESRD (end stage renal disease) on dialysis ([place])   History of CVA (cerebrovascular accident)   Thrombocytopenia ([place])   Diabetic ulcer of right foot associated with diabetes mellitus due to underlying condition ([place])   Sepsis, improving, secondary to cellulitis of right foot associated with diabetic ulcer, osteomyelitis ruled out. Presented with leukocytosis 20K, tachycardia, tachypnea, cellulitis distal dorsal right foot on Xray and MRI. Has plantar diabetic ulcer at base of first MTP without obvious drainage.  ESR 42  and CRP 20 No evidence of DVT on bilateral Doppler ultrasound No evidence of osteomyelitis on MRI right foot. Leukocytosis has improved Initially received Rocephin, then switched to IV Zosyn and IV vancomycin Started Augmentin on 01/10/2020 Renally dosed by pharmacy Continue pain control  Seizure disorder Generalized convulsions 7/27 AM.  Per her daughter ceased to take her AED Keppra about 1 year ago, did not return to neuro for refills.  EEG ordered, no epileptiform activity MRI brain ordered, results are pending.  Follow results. Continue Keppra to 50 mg twice daily as recommended by neurology  Dr. [place] Crane discussed with neurology, Dr, Hortense Ramal.  She recommended continue home dose Keppra 250 mg twice daily and follow-up with neurology outpatient if MRI unremarkable for any acute intracranial findings.  We will then discharge from a neurological standpoint.  No reported recurrence of seizure activities.  Continue seizure precautions. EEG reviewed, no evidence of seizures. Pt to f/u with neuro as outpt  Peripheral arterial disease seen on bilateral lower extremity vascular ultrasound ABI done on 01/08/20, showing mild right lower extremity arterial disease, moderate left lower  extremity arterial disease. Continue aspirin, 162 mg daily Possibly not on statin due to ESRD. Will need to follow-up with a cardiologist  History of CVA: Continue  home aspirin 162 mg daily, monitor platelet counts as above. Not currently on statin therapy possibly due to ESRD.  Acute hyperkalemia, resolved with hemodialysis, in the setting of ESRD Serum potassium 5.8> 4.5> 5.2> 3.4 Electrolytes addressed by hemodialysis. Had hemodialysis on 01/06/2020 and 01/09/2020 and 7/28  Mild hypovolemic hyponatremia Volume status managed with hemodialysis Serum sodium 132 currently  Bilateral lower extremity edema, resolved post hemodialysis, secondary to volume overload on exam Presented with 1+ pitting edema in lower extremities bilaterally, has improved after hemodialysis. No evidence of DVT on bilateral Doppler ultrasound Advised to elevate her legs when at rest  ESRD on HD MWF: Seen by Dr. Candiss Norse, appreciate nephrology's assistance. Had hemodialysis on 01/06/2020 and 01/09/2020.   Thrombocytopenia secondary to acute illness, improving: No evidence of bleeding Plt now normalized  Insulin-dependent type 2 diabetes, with hypoglycemia : A1C 7.2 on 01/05/20 Hypoglycemic AM of 7/26 Novolin dose reduced to 5 units twice daily to avoid hypoglycemia, discussed with diabetic coordinator, increase to 10 units Cont to titrate insulin with goal of euglycemia  Hypertension: Blood pressure is at goal Continue home labetalol.  Hydralazine as needed. Continue to monitor BP  Vitamin B12 deficiency Continue monthly B12 IM injection after hemodialysis 01/09/2020.   DVT prophylaxis: Heparin subq Code Status: Full Family Communication: Pt in room, family not at bedside  Status is: Inpatient  Remains inpatient appropriate because:Unsafe d/c plan   Dispo:  Patient From: Home  Planned Disposition: Home with Health Care Svc  Expected discharge date: 01/11/20  Medically stable for discharge: No        Consultants:   Nephrology    Procedures:     Antimicrobials: Anti-infectives (From admission, onward)   Start     Dose/Rate Route  Frequency Ordered Stop   01/10/20 1800  amoxicillin-clavulanate (AUGMENTIN) 500-125 MG per tablet 500 mg     Discontinue     1 tablet Oral Daily-1800 01/09/20 1032     01/10/20 1000  doxycycline (VIBRA-TABS) tablet 100 mg     Discontinue     100 mg Oral Every 12 hours 01/09/20 1032     01/09/20 1200  vancomycin (VANCOCIN) IVPB 750 mg/150 ml premix        750 mg 150 mL/hr over 60 Minutes Intravenous Every M-W-F (Hemodialysis) 01/08/20 0557 01/09/20 1802   01/08/20 1400  piperacillin-tazobactam (ZOSYN) IVPB 2.25 g        2.25 g 100 mL/hr over 30 Minutes Intravenous Every 8 hours 01/08/20 0557 01/09/20 2359   01/08/20 0600  piperacillin-tazobactam (ZOSYN) IVPB 3.375 g        3.375 g 100 mL/hr over 30 Minutes Intravenous  Once 01/08/20 0550 01/08/20 0654   01/08/20 0600  vancomycin (VANCOCIN) IVPB 1000 mg/200 mL premix  Status:  Discontinued        1,000 mg 200 mL/hr over 60 Minutes Intravenous  Once 01/08/20 0550 01/08/20 0555   01/08/20 0600  vancomycin (VANCOREADY) IVPB 1750 mg/350 mL        1,750 mg 175 mL/hr over 120 Minutes Intravenous  Once 01/08/20 0557 01/08/20 1044   01/06/20 0800  cefTRIAXone (ROCEPHIN) 1 g in sodium chloride 0.9 % 100 mL IVPB  Status:  Discontinued        1 g 200 mL/hr over 30 Minutes Intravenous Every 24 hours 01/05/20 1906 01/06/20 1047  01/05/20 1800  cefTRIAXone (ROCEPHIN) 2 g in sodium chloride 0.9 % 100 mL IVPB  Status:  Discontinued        2 g 200 mL/hr over 30 Minutes Intravenous Every 24 hours 01/05/20 1752 01/05/20 1906       Subjective: Without complaints currently  Objective: Vitals:   01/11/20 1530 01/11/20 1600 01/11/20 1630 01/11/20 1700  BP: (!) 151/66 (!) 165/66 (!) 173/73 (!) 186/84  Pulse: 76 78 80 80  Resp: _0 Temp:      TempSrc:      SpO2:      Weight:      Height:        Intake/Output Summary (Last 24 hours) at 01/11/2020 1724 Last data filed at 01/11/2020 1255 Gross per 24 hour  Intake 300 ml  Output --  Net  300 ml   Filed Weights   01/09/20 1814 01/10/20 0427 01/11/20 1415  Weight: 80.1 kg 80.6 kg 86.3 kg    Examination:  General exam: Appears calm and comfortable  Respiratory system: Clear to auscultation. Respiratory effort normal. Cardiovascular system: S1 & S2 heard, Regular Gastrointestinal system: Abdomen is nondistended, soft and nontender. No organomegaly or masses felt. Normal bowel sounds heard. Central nervous system: Alert and oriented. No focal neurological deficits. Extremities: Symmetric 5 x 5 power. Skin: No rashes, lesions Psychiatry: Judgement and insight appear normal. Mood & affect appropriate.   Data Reviewed: I have personally reviewed following labs and imaging studies  CBC: Recent Labs  Lab 01/06/20 0906 01/06/20 1827 01/07/20 0752 01/07/20 0752 01/08/20 0201 01/09/20 0430 01/09/20 1450 01/10/20 0352 01/11/20 1400  WBC 20.2*   < > 15.4*   < > 12.4* 11.3* 11.2* 9.8 11.0*  NEUTROABS 17.0*  --  12.4*  --  9.3* 8.6*  --  6.9  --   HGB 11.4*   < > 11.1*   < > 11.3* 10.9* 10.4* 11.8* 10.7*  HCT 34.7*   < > 32.8*   < > 33.0* 31.6* 30.3* 34.1* 31.0*  MCV 94.3   < > 90.6   < > 91.7 90.5 89.4 90.7 90.1  PLT 95*   < > 98*   < > 113* 145* 132* 129* 168   < > = values in this interval not displayed.   Basic Metabolic Panel: Recent Labs  Lab 01/06/20 0411 01/06/20 0411 01/06/20 1827 01/06/20 1827 01/07/20 0752 01/08/20 0201 01/09/20 0430 01/10/20 0352 01/11/20 0428  NA 136   < > 136   < > 135 133* 133* 134* 132*  K 5.8*   < > 4.1   < > 4.3 4.5 5.2* 3.4* 3.9  CL 99   < > 94*   < > 96* 94* 95* 95* 92*  CO2 24   < > 27   < > 28 26 21* 28 24  GLUCOSE 127*   < > 124*   < > 198* 187* 149* 137* 226*  BUN 37*   < > 17   < > 28* 43* 61* 25* 41*  CREATININE 7.65*   < > 4.53*   < > 5.77* 7.33* 9.42* 5.44* 7.32*  CALCIUM 8.4*   < > 8.9   < > 8.3* 8.2* 8.0* 8.1* 8.0*  PHOS 4.6  --  3.2  --   --   --   --   --   --    < > = values in this interval not displayed.     GFR: Estimated  Creatinine Clearance: 7.1 mL/min (A) (by C-G formula based on SCr of 7.32 mg/dL (H)). Liver Function Tests: Recent Labs  Lab 01/05/20 1535 01/06/20 0411 01/06/20 1827  AST 19  --   --   ALT 18  --   --   ALKPHOS 78  --   --   BILITOT 1.2  --   --   PROT 7.8  --   --   ALBUMIN 3.4* 2.8* 2.9*   No results for input(s): LIPASE, AMYLASE in the last 168 hours. No results for input(s): AMMONIA in the last 168 hours. Coagulation Profile: Recent Labs  Lab 01/05/20 1830  INR 1.4*   Cardiac Enzymes: No results for input(s): CKTOTAL, CKMB, CKMBINDEX, TROPONINI in the last 168 hours. BNP (last 3 results) No results for input(s): PROBNP in the last 8760 hours. HbA1C: No results for input(s): HGBA1C in the last 72 hours. CBG: Recent Labs  Lab 01/10/20 1207 01/10/20 1725 01/10/20 2002 01/11/20 0744 01/11/20 1158  GLUCAP 123* 196* 222* 195* 270*   Lipid Profile: No results for input(s): CHOL, HDL, LDLCALC, TRIG, CHOLHDL, LDLDIRECT in the last 72 hours. Thyroid Function Tests: No results for input(s): TSH, T4TOTAL, FREET4, T3FREE, THYROIDAB in the last 72 hours. Anemia Panel: No results for input(s): VITAMINB12, FOLATE, FERRITIN, TIBC, IRON, RETICCTPCT in the last 72 hours. Sepsis Labs: Recent Labs  Lab 01/05/20 1535  LATICACIDVEN 1.4    Recent Results (from the past 240 hour(s))  Blood Culture (routine x 2)     Status: None   Collection Time: 01/05/20  6:30 PM   Specimen: BLOOD  Result Value Ref Range Status   Specimen Description BLOOD RIGHT ANTECUBITAL  Final   Special Requests   Final    BOTTLES DRAWN AEROBIC AND ANAEROBIC Blood Culture adequate volume   Culture   Final    NO GROWTH 5 DAYS Performed at [facility], [address], [place], [place] [address]    Report Status 01/10/2020 FINAL  Final  SARS Coronavirus 2 by RT PCR (hospital order, performed in [facility] hospital lab) Nasopharyngeal Nasopharyngeal Swab     Status: None    Collection Time: 01/05/20  6:30 PM   Specimen: Nasopharyngeal Swab  Result Value Ref Range Status   SARS Coronavirus 2 NEGATIVE NEGATIVE Final    Comment: (NOTE) SARS-CoV-2 target nucleic acids are NOT DETECTED.  The SARS-CoV-2 RNA is generally detectable in upper and lower respiratory specimens during the acute phase of infection. The lowest concentration of SARS-CoV-2 viral copies this assay can detect is 250 copies / mL. A negative result does not preclude SARS-CoV-2 infection and should not be used as the sole basis for treatment or other patient management decisions.  A negative result may occur with improper specimen collection / handling, submission of specimen other than nasopharyngeal swab, presence of viral mutation(s) within the areas targeted by this assay, and inadequate number of viral copies (<250 copies / mL). A negative result must be combined with clinical observations, patient history, and epidemiological information.  Fact Sheet for Patients:   StrictlyIdeas.no  Fact Sheet for Healthcare Providers: BankingDealers.co.za  This test is not yet approved or  cleared by the [place] FDA and has been authorized for detection and/or diagnosis of SARS-CoV-2 by FDA under an Emergency Use Authorization (EUA).  This EUA will remain in effect (meaning this test can be used) for the duration of the COVID-19 declaration under Section 564(b)(1) of the Act, 21 U.S.C. section 360bbb-3(b)(1), unless the  authorization is terminated or revoked sooner.  Performed at [facility], [place] [address], [place], [place] [address]   Blood Culture (routine x 2)     Status: None   Collection Time: 01/05/20  7:04 PM   Specimen: BLOOD LEFT HAND  Result Value Ref Range Status   Specimen Description BLOOD LEFT HAND  Final   Special Requests   Final    BOTTLES DRAWN AEROBIC AND ANAEROBIC Blood Culture results may not be optimal due to  an inadequate volume of blood received in culture bottles   Culture   Final    NO GROWTH 5 DAYS Performed at [facility], [place] [address] [place], [place] [address]    Report Status 01/10/2020 FINAL  Final  MRSA PCR Screening     Status: None   Collection Time: 01/08/20  5:48 AM   Specimen: Nasal Mucosa; Nasopharyngeal  Result Value Ref Range Status   MRSA by PCR NEGATIVE NEGATIVE Final    Comment:        The GeneXpert MRSA Assay (FDA approved for NASAL specimens only), is one component of a comprehensive MRSA colonization surveillance program. It is not intended to diagnose MRSA infection nor to guide or monitor treatment for MRSA infections. Performed at [facility], [place] [address], [place], [place] [address]      Radiology Studies: MR BRAIN WO CONTRAST  Result Date: 01/10/2020 CLINICAL DATA:  79 year old female with recurrent seizure like activity. Started on Keppra. EXAM: MRI HEAD WITHOUT CONTRAST TECHNIQUE: Multiplanar, multiecho pulse sequences of the brain and surrounding structures were obtained without intravenous contrast. COMPARISON:  Brain MRI 08/20/2013.  Head CT 08/07/2019. FINDINGS: Brain: Generalized cerebral volume loss since 2015 with multifocal areas of confluent parenchymal T2 and FLAIR hyperintensity over the cerebral convexities likely post ischemic gliosis - including some probably related to the scattered infarcts seen in 2015. Mostly white matter involvement although there is an area of cortical encephalomalacia in the left parietal lobe on series 11, image 19. SWI demonstrates of few subtle scattered chronic microhemorrhages superimposed, such as seen on series 16, image 30. There are are also chronic microhemorrhages in the left caudate and occasionally the brainstem. Small chronic bilateral cerebellar infarcts were acute in 2015. No restricted diffusion to suggest acute infarction. No midline shift, mass effect, evidence of mass lesion,  ventriculomegaly, extra-axial collection or acute intracranial hemorrhage. Cervicomedullary junction and pituitary are within normal limits. Hippocampal formations and mesial temporal lobe structures are within normal limits on thin slice imaging. Vascular: Major intracranial vascular flow voids are stable since 2015. Skull and upper cervical spine: Stable, negative visible cervical spine and bone marrow signal (hyperostosis of the calvarium). Sinuses/Orbits: Stable and negative. Other: Mastoids are clear. Visible internal auditory structures appear normal. Negative scalp and face soft tissues. IMPRESSION: 1.  No acute intracranial abnormality. 2. 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 Scattered chronic microhemorrhages. Electronically Signed   By: Genevie Ann M.D.   On: 01/10/2020 16:16    Scheduled Meds: . amoxicillin-clavulanate  1 tablet Oral q1800  . aspirin  162 mg Oral Daily  . Chlorhexidine Gluconate Cloth  6 each Topical Q0600  . cyanocobalamin  1,000 mcg Intramuscular Q30 days  . doxercalciferol      . doxercalciferol  3 mcg Intravenous Q M,W,F-HD  . doxycycline  100 mg  Oral Q12H  . gabapentin  300 mg Oral Daily  . heparin injection (subcutaneous)  5,000 Units Subcutaneous Q8H  . insulin aspart  0-6 Units Subcutaneous TID WC  . insulin NPH Human  10 Units Subcutaneous BID AC  . labetalol  100 mg Oral BID  . levETIRAcetam  250 mg Oral BID  . multivitamin  1 tablet Oral QHS   Continuous Infusions:   LOS: 5 days   Marylu Lund, MD [facility] Pager On Amion  If 7PM-7AM, please contact night-coverage 01/11/2020, 5:24 PM

## 2020-01-11 NOTE — TOC Progression Note (Signed)
Transition of Care [facility]) - Progression Note    Patient Details  Name: [name] MRN: [id number] Date of Birth: [birth year]  [facility] CM/SW Contact  Jacalyn Lefevre Edson Snowball, RN Phone Number: 01/11/2020, 10:34 AM  Clinical Narrative:     Followed up with patient today regarding home health. Patient has not had a chance to discuss with her daughter. Patient prefers to speak to her daughter before making a decision and does not want NCM to call daughter .   Will continue to follow.   Expected Discharge Plan: [place] Barriers to Discharge: Continued Medical Work up  Expected Discharge Plan and Services Expected Discharge Plan: [place]   Discharge Planning Services: CM Consult Post Acute Care Choice: [place] arrangements for the past 2 months: Single Family Home                 DME Arranged: N/A DME Agency: NA       HH Arranged: PT           Social Determinants of Health (SDOH) Interventions    Readmission Risk Interventions No flowsheet data found.

## 2020-01-11 NOTE — Progress Notes (Signed)
[facility] Progress Note    Assessment/ Plan:   1. ESRD on hemodialysis  1. Monday Wednesday Friday ([place]). RUE AVF  Outpatient orders: 4 hours, revaclear300, BFR 400/dfr 600. 3k, 2.5cal, 138na, 35bicarb. No heparin.  2. HD on MWF schedule: 3K, 4h, 2-3L UF, no heparin, AVF 2. Anemia of chronic kidney disease  hgb stable  receives epogen 4k units outpatient (holding) 3. Secondary hyperparathyroidism; phos ok, recevies hectorol 3.64mcg w/ treatments 4. Sepsis secondary to cellulitis of right foot associate with diabetic ulcer 1. Osteomyelitis ruled out 2. Now no augmentin/doxy 3. Management per primary service 5. HTN/volume status, bp at [facility] 1. Probe down post HD weights as able, BP ok   [name]  Subjective:   No c/o, in bed, feels good   Objective:   BP (!) 126/50 (BP Location: Left Arm)   Pulse 74   Temp 98.5 F (36.9 C) (Oral)   Resp 16   Ht 5\' 8"  (1.727 m)   Wt 80.6 kg   SpO2 100%   BMI 27.02 kg/m   Intake/Output Summary (Last 24 hours) at 01/11/2020 1258 Last data filed at 01/11/2020 0758 Gross per 24 hour  Intake 340 ml  Output --  Net 340 ml   Weight change:   Physical Exam: Gen: No acute distress, nontoxic appearing CVS: Regular rate, S1-S2 Resp: Clear to auscultation bilaterally, normal work of breathing, no wheezes/rhonchi/rales/crackles, bilateral chest expansion Abd: Soft, nontender/nondistended Ext: Right upper extremity AVF with good bruit and thrill, right foot with erythema and warmth Neuro: No focal deficits, aaox3   Imaging: MR BRAIN WO CONTRAST  Result Date: 01/10/2020 CLINICAL DATA:  79 year old female with recurrent seizure like activity. Started on Keppra. EXAM: MRI HEAD WITHOUT CONTRAST TECHNIQUE: Multiplanar, multiecho pulse sequences of the brain and surrounding structures were obtained without intravenous contrast. COMPARISON:  Brain MRI 08/20/2013.  Head CT 08/07/2019.  FINDINGS: Brain: Generalized cerebral volume loss since 2015 with multifocal areas of confluent parenchymal T2 and FLAIR hyperintensity over the cerebral convexities likely post ischemic gliosis - including some probably related to the scattered infarcts seen in 2015. Mostly white matter involvement although there is an area of cortical encephalomalacia in the left parietal lobe on series 11, image 19. SWI demonstrates of few subtle scattered chronic microhemorrhages superimposed, such as seen on series 16, image 30. There are are also chronic microhemorrhages in the left caudate and occasionally the brainstem. Small chronic bilateral cerebellar infarcts were acute in 2015. No restricted diffusion to suggest acute infarction. No midline shift, mass effect, evidence of mass lesion, ventriculomegaly, extra-axial collection or acute intracranial hemorrhage. Cervicomedullary junction and pituitary are within normal limits. Hippocampal formations and mesial temporal lobe structures are within normal limits on thin slice imaging. Vascular: Major intracranial vascular flow voids are stable since 2015. Skull and upper cervical spine: Stable, negative visible cervical spine and bone marrow signal (hyperostosis of the calvarium). Sinuses/Orbits: Stable and negative. Other: Mastoids are clear. Visible internal auditory structures appear normal. Negative scalp and face soft tissues. IMPRESSION: 1.  No acute intracranial abnormality. 2. 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 Scattered chronic microhemorrhages. Electronically Signed   By: Genevie Ann M.D.   On: 01/10/2020 16:16    Labs: BMET Recent Labs  Lab 01/06/20 0411 01/06/20 1827 01/07/20 0752 01/08/20 0201 01/09/20 0430 01/10/20  0352 01/11/20 0428  NA 136 136 135 133* 133* 134* 132*  K 5.8* 4.1 4.3 4.5 5.2* 3.4*  3.9  CL 99 94* 96* 94* 95* 95* 92*  CO2 24 27 28 26  21* 28 24  GLUCOSE 127* 124* 198* 187* 149* 137* 226*  BUN 37* 17 28* 43* 61* 25* 41*  CREATININE 7.65* 4.53* 5.77* 7.33* 9.42* 5.44* 7.32*  CALCIUM 8.4* 8.9 8.3* 8.2* 8.0* 8.1* 8.0*  PHOS 4.6 3.2  --   --   --   --   --    CBC Recent Labs  Lab 01/07/20 0752 01/07/20 0752 01/08/20 0201 01/09/20 0430 01/09/20 1450 01/10/20 0352  WBC 15.4*   < > 12.4* 11.3* 11.2* 9.8  NEUTROABS 12.4*  --  9.3* 8.6*  --  6.9  HGB 11.1*   < > 11.3* 10.9* 10.4* 11.8*  HCT 32.8*   < > 33.0* 31.6* 30.3* 34.1*  MCV 90.6   < > 91.7 90.5 89.4 90.7  PLT 98*   < > 113* 145* 132* 129*   < > = values in this interval not displayed.    Medications:    . amoxicillin-clavulanate  1 tablet Oral q1800  . aspirin  162 mg Oral Daily  . Chlorhexidine Gluconate Cloth  6 each Topical Q0600  . cyanocobalamin  1,000 mcg Intramuscular Q30 days  . doxercalciferol  3 mcg Intravenous Q M,W,F-HD  . doxycycline  100 mg Oral Q12H  . gabapentin  300 mg Oral Daily  . heparin injection (subcutaneous)  5,000 Units Subcutaneous Q8H  . insulin aspart  0-6 Units Subcutaneous TID WC  . insulin NPH Human  5 Units Subcutaneous BID AC  . labetalol  100 mg Oral BID  . levETIRAcetam  250 mg Oral BID  . multivitamin  1 tablet Oral QHS

## 2020-01-12 ENCOUNTER — Other Ambulatory Visit: Payer: Self-pay | Admitting: Physician Assistant

## 2020-01-12 DIAGNOSIS — E1122 Type 2 diabetes mellitus with diabetic chronic kidney disease: Secondary | ICD-10-CM

## 2020-01-12 DIAGNOSIS — Z794 Long term (current) use of insulin: Secondary | ICD-10-CM

## 2020-01-12 DIAGNOSIS — Z992 Dependence on renal dialysis: Secondary | ICD-10-CM | POA: Diagnosis not present

## 2020-01-12 DIAGNOSIS — I96 Gangrene, not elsewhere classified: Secondary | ICD-10-CM

## 2020-01-12 DIAGNOSIS — L03115 Cellulitis of right lower limb: Secondary | ICD-10-CM | POA: Diagnosis not present

## 2020-01-12 DIAGNOSIS — N186 End stage renal disease: Secondary | ICD-10-CM | POA: Diagnosis not present

## 2020-01-12 LAB — GLUCOSE, CAPILLARY
Glucose-Capillary: 206 mg/dL — ABNORMAL HIGH (ref 70–99)
Glucose-Capillary: 293 mg/dL — ABNORMAL HIGH (ref 70–99)
Glucose-Capillary: 219 mg/dL — ABNORMAL HIGH (ref 70–99)
Glucose-Capillary: 157 mg/dL — ABNORMAL HIGH (ref 70–99)

## 2020-01-12 NOTE — Progress Notes (Signed)
[facility] Progress Note    Assessment/ Plan:   1. ESRD on hemodialysis  1. Monday Wednesday Friday ([place]). RUE AVF  Outpatient orders: 4 hours, revaclear300, BFR 400/dfr 600. 3k, 2.5cal, 138na, 35bicarb. No heparin.  2. HD on MWF schedule: 3K, 4h, 2-3L UF, no heparin, AVF 2. Anemia of chronic kidney disease  hgb stable  receives epogen 4k units outpatient 3. Secondary hyperparathyroidism; phos ok, recevies hectorol 3.20mcg w/ treatments 4. Sepsis secondary to cellulitis of right foot associate with diabetic ulcer 1. Osteomyelitis ruled out 2. Now no augmentin/doxy 3. Management per primary service 5. HTN/volume status, bp at [facility] 1. Probe down post HD weights as able, BP ok   [facility]  Subjective:   No c/o. Daughter with pt HD yesterday, 3L UF, uneventful DC plan is home with HH   Objective:   BP (!) 153/59 (BP Location: Left Arm)   Pulse 77   Temp 98.1 F (36.7 C)   Resp 17   Ht 5\' 8"  (1.727 m)   Wt 85.6 kg   SpO2 98%   BMI 28.69 kg/m   Intake/Output Summary (Last 24 hours) at 01/12/2020 1155 Last data filed at 01/12/2020 0830 Gross per 24 hour  Intake 360 ml  Output 3000 ml  Net -2640 ml   Weight change:   Physical Exam: Gen: NAD in chair, pleasant CVS: Regular rate, S1-S2 Resp: Clear to auscultation bilaterally, normal work of breathing, no wheezes/rhonchi/rales/crackles, bilateral chest expansion Abd: Soft, nontender/nondistended Ext: Right upper extremity AVF with good bruit and thrill, right foot with erythema and warmth Neuro: No focal deficits, aaox3   Imaging: MR BRAIN WO CONTRAST  Result Date: 01/10/2020 CLINICAL DATA:  79 year old female with recurrent seizure like activity. Started on Keppra. EXAM: MRI HEAD WITHOUT CONTRAST TECHNIQUE: Multiplanar, multiecho pulse sequences of the brain and surrounding structures were obtained without intravenous contrast. COMPARISON:  Brain MRI  08/20/2013.  Head CT 08/07/2019. FINDINGS: Brain: Generalized cerebral volume loss since 2015 with multifocal areas of confluent parenchymal T2 and FLAIR hyperintensity over the cerebral convexities likely post ischemic gliosis - including some probably related to the scattered infarcts seen in 2015. Mostly white matter involvement although there is an area of cortical encephalomalacia in the left parietal lobe on series 11, image 19. SWI demonstrates of few subtle scattered chronic microhemorrhages superimposed, such as seen on series 16, image 30. There are are also chronic microhemorrhages in the left caudate and occasionally the brainstem. Small chronic bilateral cerebellar infarcts were acute in 2015. No restricted diffusion to suggest acute infarction. No midline shift, mass effect, evidence of mass lesion, ventriculomegaly, extra-axial collection or acute intracranial hemorrhage. Cervicomedullary junction and pituitary are within normal limits. Hippocampal formations and mesial temporal lobe structures are within normal limits on thin slice imaging. Vascular: Major intracranial vascular flow voids are stable since 2015. Skull and upper cervical spine: Stable, negative visible cervical spine and bone marrow signal (hyperostosis of the calvarium). Sinuses/Orbits: Stable and negative. Other: Mastoids are clear. Visible internal auditory structures appear normal. Negative scalp and face soft tissues. IMPRESSION: 1.  No acute intracranial abnormality. 2. 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 Scattered chronic microhemorrhages. Electronically Signed   By: Genevie Ann M.D.   On: 01/10/2020 16:16    Labs: BMET Recent Labs  Lab 01/06/20 0411 01/06/20  1827 01/07/20 0752 01/08/20 0201 01/09/20 0430 01/10/20 0352 01/11/20 0428  NA 136 136 135 133* 133* 134*  132*  K 5.8* 4.1 4.3 4.5 5.2* 3.4* 3.9  CL 99 94* 96* 94* 95* 95* 92*  CO2 24 27 28 26  21* 28 24  GLUCOSE 127* 124* 198* 187* 149* 137* 226*  BUN 37* 17 28* 43* 61* 25* 41*  CREATININE 7.65* 4.53* 5.77* 7.33* 9.42* 5.44* 7.32*  CALCIUM 8.4* 8.9 8.3* 8.2* 8.0* 8.1* 8.0*  PHOS 4.6 3.2  --   --   --   --   --    CBC Recent Labs  Lab 01/07/20 0752 01/07/20 0752 01/08/20 0201 01/08/20 0201 01/09/20 0430 01/09/20 1450 01/10/20 0352 01/11/20 1400  WBC 15.4*   < > 12.4*   < > 11.3* 11.2* 9.8 11.0*  NEUTROABS 12.4*  --  9.3*  --  8.6*  --  6.9  --   HGB 11.1*   < > 11.3*   < > 10.9* 10.4* 11.8* 10.7*  HCT 32.8*   < > 33.0*   < > 31.6* 30.3* 34.1* 31.0*  MCV 90.6   < > 91.7   < > 90.5 89.4 90.7 90.1  PLT 98*   < > 113*   < > 145* 132* 129* 168   < > = values in this interval not displayed.    Medications:    . amoxicillin-clavulanate  1 tablet Oral q1800  . aspirin  162 mg Oral Daily  . Chlorhexidine Gluconate Cloth  6 each Topical Q0600  . cyanocobalamin  1,000 mcg Intramuscular Q30 days  . doxercalciferol  3 mcg Intravenous Q M,W,F-HD  . doxycycline  100 mg Oral Q12H  . gabapentin  300 mg Oral Daily  . heparin injection (subcutaneous)  5,000 Units Subcutaneous Q8H  . insulin aspart  0-6 Units Subcutaneous TID WC  . insulin NPH Human  10 Units Subcutaneous BID AC  . labetalol  100 mg Oral BID  . levETIRAcetam  250 mg Oral BID  . multivitamin  1 tablet Oral QHS

## 2020-01-12 NOTE — TOC Progression Note (Addendum)
Transition of Care [facility]) - Progression Note    Patient Details  Name: [name] MRN: [id number] Date of Birth: December 21, 1940  Transition of Care [facility]) CM/SW Contact  [name], Barbara Snowball, RN Phone Number: 01/12/2020, 10:07 AM  Clinical Narrative:     Daughter [name] at bedside. Discussed home health services, daughter would like [name] , spoke to [name] with [name] no staff available.   Second choice is [name] , spoke to [name] with [name] no staff available   Third choice is [name], spoke to [name] she cannot accept due to insurance.   Fourth choice is [name] , left message for [name] awaiting call back.[name] with [name] can accept referral for HHPT only. Explained to patient and daughter at bedside. NCM offered to continue to call home health agencies listed for patient's zip code. However, daughter would like [name]. Bedside nurse [name] will provide wound education prior to discharge and some supplies. Sent attending MD secure chat   Expected Discharge Plan: [name] Barriers to Discharge: Continued Medical Work up  Expected Discharge Plan and Services Expected Discharge Plan: [name]   Discharge Planning Services: CM Consult Post Acute Care Choice: [name] arrangements for the past 2 months: Single Family Home                 DME Arranged: N/A DME Agency: NA       HH Arranged: PT           Social Determinants of Health (SDOH) Interventions    Readmission Risk Interventions No flowsheet data found.

## 2020-01-12 NOTE — Progress Notes (Signed)
Physical Therapy Treatment Patient Details Name: EDWYNA DANGERFIELD MRN: [id number] DOB: Nov 17, 1940 Today's Date: 01/12/2020    History of Present Illness JOSEPHA BARBIER is a 79 y.o. female with PMH of ESRD on HD MWF, IDT2DM, history of CVA, HTN who wasadmitted on 01/05/2020 for right foot cellulitis and has been on Vanc/Zosyn. On 7/26  patient was noted to have generalized shaking concerning for seizure x2, EEG WNL.    PT Comments    Patient received up in recliner, daughter present. Patient talking on phone. She is agreeable to PT. Reports she continues to have pain in right foot, but improving. Requires min guard for sit to stand transfer from recliner. Ambulated with RW to bathroom and then out in hallway, min guard. Initially slightly unsteady, but balance improved with increased distance and mobility. She will continue to benefit from skilled PT while here to improve strength, balance and functional independence.     Follow Up Recommendations  Home health PT;Supervision for mobility/OOB     Equipment Recommendations  None recommended by PT    Recommendations for Other Services       Precautions / Restrictions Precautions Precautions: Fall Restrictions Weight Bearing Restrictions: No    Mobility  Bed Mobility               General bed mobility comments: patient received in recliner and remained in recliner  Transfers Overall transfer level: Needs assistance Equipment used: Rolling walker (2 wheeled) Transfers: Sit to/from Stand Sit to Stand: Min guard         General transfer comment: min guard, cues for hand placement  Ambulation/Gait Ambulation/Gait assistance: Min guard Gait Distance (Feet): 70 Feet Assistive device: Rolling walker (2 wheeled) Gait Pattern/deviations: Step-through pattern;Decreased stride length;Shuffle Gait velocity: decreased   General Gait Details: Patient slightly unsteady initially with ambulation, improved with improved  distance. Cues for sequencing.   Stairs             Wheelchair Mobility    Modified Rankin (Stroke Patients Only)       Balance Overall balance assessment: Needs assistance Sitting-balance support: Feet supported;No upper extremity supported Sitting balance-Leahy Scale: Good     Standing balance support: Bilateral upper extremity supported;During functional activity Standing balance-Leahy Scale: Fair Standing balance comment: reliant on RW and min guard. Slightly unsteady with initial ambulation.                            Cognition Arousal/Alertness: Awake/alert Behavior During Therapy: WFL for tasks assessed/performed Overall Cognitive Status: Within Functional Limits for tasks assessed                                 General Comments: WFL for basic conversation, oriented x4      Exercises Other Exercises Other Exercises: seated B LE exercises to include: AP, LAQ, marching x 10 reps each    General Comments        Pertinent Vitals/Pain Pain Assessment: Faces Faces Pain Scale: Hurts a little bit Pain Location: R foot Pain Descriptors / Indicators: Discomfort Pain Intervention(s): Monitored during session    Home Living                      Prior Function            PT Goals (current goals can now be found in the care plan section)  Acute Rehab PT Goals Patient Stated Goal: return home PT Goal Formulation: With patient/family Time For Goal Achievement: 01/24/20 Potential to Achieve Goals: Good Progress towards PT goals: Progressing toward goals    Frequency    Min 3X/week      PT Plan Current plan remains appropriate    Co-evaluation              AM-PAC PT "6 Clicks" Mobility   Outcome Measure  Help needed turning from your back to your side while in a flat bed without using bedrails?: A Little Help needed moving from lying on your back to sitting on the side of a flat bed without using  bedrails?: A Little Help needed moving to and from a bed to a chair (including a wheelchair)?: A Little Help needed standing up from a chair using your arms (e.g., wheelchair or bedside chair)?: A Little Help needed to walk in hospital room?: A Little Help needed climbing 3-5 steps with a railing? : A Little 6 Click Score: 18    End of Session Equipment Utilized During Treatment: Gait belt Activity Tolerance: Patient tolerated treatment well Patient left: in chair;with call bell/phone within reach;with family/visitor present Nurse Communication: Mobility status PT Visit Diagnosis: Muscle weakness (generalized) (M62.81);Pain;Difficulty in walking, not elsewhere classified (R26.2) Pain - Right/Left: Right Pain - part of body: Ankle and joints of foot     Time: 1030-1059 PT Time Calculation (min) (ACUTE ONLY): 29 min  Charges:  $Gait Training: 8-22 mins $Therapeutic Exercise: 8-22 mins                     Zelina Jimerson, PT, GCS 01/12/20,11:07 AM

## 2020-01-12 NOTE — Progress Notes (Signed)
PROGRESS NOTE    [name]  KWI:[id number] DOB: 11-Feb-1941 DOA: 01/05/2020 PCP: [name], MD    Brief Narrative:  79 y.o.femalewith medical history significant forESRD on HD MWF, IDT2DM, history of CVA, seizure disorder not taking AEDs, HTN, who presents to [facility] ED for evaluation of right foot cellulitis. Follows with her podiatrist regularly for management of an ulcer on the plantar surface of the right foot at the base of the first MTP. She had been wearing a boot for several weeks and noticed some swelling in her right leg since then. She went to see her podiatrist on 01/05/2020 who felt she was developing cellulitis of the right foot. She was subsequently sent to the ED for further evaluation and management.  In the ED, Temp 99.9 Fahrenheit, WBC 23.6K, hemoglobin 12.4K, platelets 83,000 (171,000 previously on 05/15/2019), lactic acid 1.4, sodium 136, potassium 4.6, bicarb 27, BUN 26, creatinine 6.66.  SARS-CoV-2 PCR negative. Blood cultures x2 negative to date.  Right foot x-ray shows soft tissue swelling with ulcer/wound along plantar aspect of the foot at the level of the first MTP. No definite acute osseous abnormality seen.  MRI right foot negative for osteomyelitis.  Poor dorsalis pedis pulses.  [place] ABI, B/L + PAD.  Daughter in the room requests referral to a different podiatrist.  TOC consulted to assist with this request.  Hospital course complicated by possible seizure activity, the morning of 01/09/2020, for which an EEG was done and neurology was consulted.  Discussed with neurology, Dr, Hortense Ramal.  She recommended continue home dose Keppra 250 mg twice daily and follow-up with neurology outpatient if MRI unremarkable for any acute intracranial findings.  No reported recurrence of seizure-like activities.  Assessment & Plan:   Principal Problem:   Cellulitis of right foot Active Problems:   Diabetes mellitus, type 2 (HCC)   Leukocytosis   Hypertension  associated with diabetes ([place])   ESRD (end stage renal disease) on dialysis ([place])   History of CVA (cerebrovascular accident)   Thrombocytopenia ([place])   Diabetic ulcer of right foot associated with diabetes mellitus due to underlying condition ([place])   Sepsis, improving, secondary to cellulitis of right foot associated with diabetic ulcer, osteomyelitis ruled out. Presented with leukocytosis 20K, tachycardia, tachypnea, cellulitis distal dorsal right foot on Xray and MRI. Has plantar diabetic ulcer at base of first MTP without obvious drainage.  ESR 42  and CRP 20 No evidence of DVT on bilateral Doppler ultrasound No evidence of osteomyelitis on MRI right foot. Leukocytosis has improved Initially received Rocephin, then switched to IV Zosyn and IV vancomycin Started Augmentin on 01/10/2020 This AM, noted to have purulent drainage around foot wound Have consulted Orthopedic Surgery. Appreciate input  Seizure disorder Generalized convulsions 7/27 AM.  Per her daughter ceased to take her AED Keppra about 1 year ago, did not return to neuro for refills.  EEG ordered, no epileptiform activity MRI brain ordered, results are pending.  Follow results. Continue Keppra to 50 mg twice daily as recommended by neurology  Dr. [place] Crane discussed with neurology, Dr, Hortense Ramal.  She recommended continue home dose Keppra 250 mg twice daily and follow-up with neurology outpatient if MRI unremarkable for any acute intracranial findings.  We will then discharge from a neurological standpoint.  No reported recurrence of seizure activities.  Continue seizure precautions. EEG recently reviewed, no evidence of seizures. Pt to f/u with neuro as outpt  Peripheral arterial disease seen on bilateral lower extremity vascular ultrasound ABI done on 01/08/20,  showing mild right lower extremity arterial disease, moderate left lower extremity arterial disease. Continue aspirin, 162 mg daily Possibly not on statin due to  ESRD. Will need to follow-up with a cardiologist when discharged  History of CVA: Continue home aspirin 162 mg daily, monitor platelet counts as above. Not currently on statin therapy possibly due to ESRD.  Acute hyperkalemia, resolved with hemodialysis, in the setting of ESRD Serum potassium 5.8> 4.5> 5.2> 3.4 Electrolytes addressed by hemodialysis. Had hemodialysis on 01/06/2020 and 01/09/2020 and 7/28  Mild hypovolemic hyponatremia Volume status managed with hemodialysis Serum sodium 132 on most recent check  Bilateral lower extremity edema, resolved post hemodialysis, secondary to volume overload on exam Presented with 1+ pitting edema in lower extremities bilaterally, has improved after hemodialysis. No evidence of DVT on bilateral Doppler ultrasound Advised to elevate her legs when at rest  ESRD on HD MWF: Seen by Dr. Candiss Norse, appreciate nephrology's assistance. Had hemodialysis on 01/06/2020 and 01/09/2020.   Thrombocytopenia secondary to acute illness, improving: No evidence of bleeding Plt now normalized  Insulin-dependent type 2 diabetes, with hypoglycemia : A1C 7.2 on 01/05/20 Hypoglycemic AM of 7/26 Novolin dose reduced to 5 units twice daily to avoid hypoglycemia, discussed with diabetic coordinator, increase to 10 units Cont to titrate insulin with goal of euglycemia  Hypertension: Blood pressure is at goal Continue home labetalol.  Hydralazine as needed. Continue to monitor BP for now  Vitamin B12 deficiency Continue monthly B12 IM injection after hemodialysis 01/09/2020.   DVT prophylaxis: Heparin subq Code Status: Full Family Communication: Pt in room, family not at bedside  Status is: Inpatient  Remains inpatient appropriate because:Unsafe d/c plan   Dispo:  Patient From: Home  Planned Disposition: Home with Health Care Svc  Expected discharge date: 01/11/20  Medically stable for discharge: No   Consultants:   Nephrology  Orthopedic  Surgery  Procedures:     Antimicrobials: Anti-infectives (From admission, onward)   Start     Dose/Rate Route Frequency Ordered Stop   01/10/20 1800  amoxicillin-clavulanate (AUGMENTIN) 500-125 MG per tablet 500 mg     Discontinue     1 tablet Oral Daily-1800 01/09/20 1032     01/10/20 1000  doxycycline (VIBRA-TABS) tablet 100 mg     Discontinue     100 mg Oral Every 12 hours 01/09/20 1032     01/09/20 1200  vancomycin (VANCOCIN) IVPB 750 mg/150 ml premix        750 mg 150 mL/hr over 60 Minutes Intravenous Every M-W-F (Hemodialysis) 01/08/20 0557 01/09/20 1802   01/08/20 1400  piperacillin-tazobactam (ZOSYN) IVPB 2.25 g        2.25 g 100 mL/hr over 30 Minutes Intravenous Every 8 hours 01/08/20 0557 01/09/20 2359   01/08/20 0600  piperacillin-tazobactam (ZOSYN) IVPB 3.375 g        3.375 g 100 mL/hr over 30 Minutes Intravenous  Once 01/08/20 0550 01/08/20 0654   01/08/20 0600  vancomycin (VANCOCIN) IVPB 1000 mg/200 mL premix  Status:  Discontinued        1,000 mg 200 mL/hr over 60 Minutes Intravenous  Once 01/08/20 0550 01/08/20 0555   01/08/20 0600  vancomycin (VANCOREADY) IVPB 1750 mg/350 mL        1,750 mg 175 mL/hr over 120 Minutes Intravenous  Once 01/08/20 0557 01/08/20 1044   01/06/20 0800  cefTRIAXone (ROCEPHIN) 1 g in sodium chloride 0.9 % 100 mL IVPB  Status:  Discontinued        1 g 200 mL/hr  over 30 Minutes Intravenous Every 24 hours 01/05/20 1906 01/06/20 1047   01/05/20 1800  cefTRIAXone (ROCEPHIN) 2 g in sodium chloride 0.9 % 100 mL IVPB  Status:  Discontinued        2 g 200 mL/hr over 30 Minutes Intravenous Every 24 hours 01/05/20 1752 01/05/20 1906      Subjective: Noting R foot pain this AM  Objective: Vitals:   01/11/20 1920 01/11/20 2137 01/12/20 0504 01/12/20 1421  BP: (!) 174/58 (!) 134/46 (!) 153/59 (!) 142/51  Pulse: 86 84 77 72  Resp: _0 Temp: 98.9 F (37.2 C) 99.8 F (37.7 C) 98.1 F (36.7 C) 98.1 F (36.7 C)  TempSrc:    Oral    SpO2: 98% 100% 98% 100%  Weight:   85.6 kg   Height:        Intake/Output Summary (Last 24 hours) at 01/12/2020 1541 Last data filed at 01/12/2020 1300 Gross per 24 hour  Intake 360 ml  Output 3000 ml  Net -2640 ml   Filed Weights   01/11/20 1415 01/11/20 1819 01/12/20 0504  Weight: 86.3 kg 83.3 kg 85.6 kg    Examination: General exam: Awake, laying in bed, in nad Respiratory system: Normal respiratory effort, no wheezing Cardiovascular system: regular rate, s1, s2 Gastrointestinal system: Soft, nondistended, positive BS Central nervous system: CN2-12 grossly intact, strength intact Extremities: Perfused, no clubbing, R foot swollen with purulent drainage noted this AM Skin: Normal skin turgor, no notable skin lesions seen Psychiatry: Mood normal // no visual hallucinations    Data Reviewed: I have personally reviewed following labs and imaging studies  CBC: Recent Labs  Lab 01/06/20 0906 01/06/20 1827 01/07/20 0752 01/07/20 0752 01/08/20 0201 01/09/20 0430 01/09/20 1450 01/10/20 0352 01/11/20 1400  WBC 20.2*   < > 15.4*   < > 12.4* 11.3* 11.2* 9.8 11.0*  NEUTROABS 17.0*  --  12.4*  --  9.3* 8.6*  --  6.9  --   HGB 11.4*   < > 11.1*   < > 11.3* 10.9* 10.4* 11.8* 10.7*  HCT 34.7*   < > 32.8*   < > 33.0* 31.6* 30.3* 34.1* 31.0*  MCV 94.3   < > 90.6   < > 91.7 90.5 89.4 90.7 90.1  PLT 95*   < > 98*   < > 113* 145* 132* 129* 168   < > = values in this interval not displayed.   Basic Metabolic Panel: Recent Labs  Lab 01/06/20 0411 01/06/20 0411 01/06/20 1827 01/06/20 1827 01/07/20 0752 01/08/20 0201 01/09/20 0430 01/10/20 0352 01/11/20 0428  NA 136   < > 136   < > 135 133* 133* 134* 132*  K 5.8*   < > 4.1   < > 4.3 4.5 5.2* 3.4* 3.9  CL 99   < > 94*   < > 96* 94* 95* 95* 92*  CO2 24   < > 27   < > 28 26 21* 28 24  GLUCOSE 127*   < > 124*   < > 198* 187* 149* 137* 226*  BUN 37*   < > 17   < > 28* 43* 61* 25* 41*  CREATININE 7.65*   < > 4.53*   < > 5.77*  7.33* 9.42* 5.44* 7.32*  CALCIUM 8.4*   < > 8.9   < > 8.3* 8.2* 8.0* 8.1* 8.0*  PHOS 4.6  --  3.2  --   --   --   --   --   --    < > =  values in this interval not displayed.   GFR: Estimated Creatinine Clearance: 7 mL/min (A) (by C-G formula based on SCr of 7.32 mg/dL (H)). Liver Function Tests: Recent Labs  Lab 01/06/20 0411 01/06/20 1827  ALBUMIN 2.8* 2.9*   No results for input(s): LIPASE, AMYLASE in the last 168 hours. No results for input(s): AMMONIA in the last 168 hours. Coagulation Profile: Recent Labs  Lab 01/05/20 1830  INR 1.4*   Cardiac Enzymes: No results for input(s): CKTOTAL, CKMB, CKMBINDEX, TROPONINI in the last 168 hours. BNP (last 3 results) No results for input(s): PROBNP in the last 8760 hours. HbA1C: No results for input(s): HGBA1C in the last 72 hours. CBG: Recent Labs  Lab 01/11/20 1158 01/11/20 1925 01/11/20 2139 01/12/20 0742 01/12/20 1144  GLUCAP 270* 107* 249* 206* 293*   Lipid Profile: No results for input(s): CHOL, HDL, LDLCALC, TRIG, CHOLHDL, LDLDIRECT in the last 72 hours. Thyroid Function Tests: No results for input(s): TSH, T4TOTAL, FREET4, T3FREE, THYROIDAB in the last 72 hours. Anemia Panel: No results for input(s): VITAMINB12, FOLATE, FERRITIN, TIBC, IRON, RETICCTPCT in the last 72 hours. Sepsis Labs: No results for input(s): PROCALCITON, LATICACIDVEN in the last 168 hours.  Recent Results (from the past 240 hour(s))  Blood Culture (routine x 2)     Status: None   Collection Time: 01/05/20  6:30 PM   Specimen: BLOOD  Result Value Ref Range Status   Specimen Description BLOOD RIGHT ANTECUBITAL  Final   Special Requests   Final    BOTTLES DRAWN AEROBIC AND ANAEROBIC Blood Culture adequate volume   Culture   Final    NO GROWTH 5 DAYS Performed at [facility], [address] [place], [place] [address]    Report Status 01/10/2020 FINAL  Final  SARS Coronavirus 2 by RT PCR (hospital order, performed in [facility]  hospital lab) Nasopharyngeal Nasopharyngeal Swab     Status: None   Collection Time: 01/05/20  6:30 PM   Specimen: Nasopharyngeal Swab  Result Value Ref Range Status   SARS Coronavirus 2 NEGATIVE NEGATIVE Final    Comment: (NOTE) SARS-CoV-2 target nucleic acids are NOT DETECTED.  The SARS-CoV-2 RNA is generally detectable in upper and lower respiratory specimens during the acute phase of infection. The lowest concentration of SARS-CoV-2 viral copies this assay can detect is 250 copies / mL. A negative result does not preclude SARS-CoV-2 infection and should not be used as the sole basis for treatment or other patient management decisions.  A negative result may occur with improper specimen collection / handling, submission of specimen other than nasopharyngeal swab, presence of viral mutation(s) within the areas targeted by this assay, and inadequate number of viral copies (<250 copies / mL). A negative result must be combined with clinical observations, patient history, and epidemiological information.  Fact Sheet for Patients:   StrictlyIdeas.no  Fact Sheet for Healthcare Providers: BankingDealers.co.za  This test is not yet approved or  cleared by the [place] FDA and has been authorized for detection and/or diagnosis of SARS-CoV-2 by FDA under an Emergency Use Authorization (EUA).  This EUA will remain in effect (meaning this test can be used) for the duration of the COVID-19 declaration under Section 564(b)(1) of the Act, 21 U.S.C. section 360bbb-3(b)(1), unless the authorization is terminated or revoked sooner.  Performed at [facility], [place] [address] [place], [place] [address]   Blood Culture (routine x 2)     Status: None   Collection Time: 01/05/20  7:04 PM   Specimen: BLOOD LEFT HAND  Result Value Ref Range Status   Specimen Description BLOOD LEFT HAND  Final   Special Requests   Final    BOTTLES DRAWN  AEROBIC AND ANAEROBIC Blood Culture results may not be optimal due to an inadequate volume of blood received in culture bottles   Culture   Final    NO GROWTH 5 DAYS Performed at [facility], [place] [address] [place], [place] [address]    Report Status 01/10/2020 FINAL  Final  MRSA PCR Screening     Status: None   Collection Time: 01/08/20  5:48 AM   Specimen: Nasal Mucosa; Nasopharyngeal  Result Value Ref Range Status   MRSA by PCR NEGATIVE NEGATIVE Final    Comment:        The GeneXpert MRSA Assay (FDA approved for NASAL specimens only), is one component of a comprehensive MRSA colonization surveillance program. It is not intended to diagnose MRSA infection nor to guide or monitor treatment for MRSA infections. Performed at [facility], [place] [address], [place], [place] [address]      Radiology Studies: MR BRAIN WO CONTRAST  Result Date: 01/10/2020 CLINICAL DATA:  79 year old female with recurrent seizure like activity. Started on Keppra. EXAM: MRI HEAD WITHOUT CONTRAST TECHNIQUE: Multiplanar, multiecho pulse sequences of the brain and surrounding structures were obtained without intravenous contrast. COMPARISON:  Brain MRI 08/20/2013.  Head CT 08/07/2019. FINDINGS: Brain: Generalized cerebral volume loss since 2015 with multifocal areas of confluent parenchymal T2 and FLAIR hyperintensity over the cerebral convexities likely post ischemic gliosis - including some probably related to the scattered infarcts seen in 2015. Mostly white matter involvement although there is an area of cortical encephalomalacia in the left parietal lobe on series 11, image 19. SWI demonstrates of few subtle scattered chronic microhemorrhages superimposed, such as seen on series 16, image 30. There are are also chronic microhemorrhages in the left caudate and occasionally the brainstem. Small chronic bilateral cerebellar infarcts were acute in 2015. No restricted diffusion to suggest acute  infarction. No midline shift, mass effect, evidence of mass lesion, ventriculomegaly, extra-axial collection or acute intracranial hemorrhage. Cervicomedullary junction and pituitary are within normal limits. Hippocampal formations and mesial temporal lobe structures are within normal limits on thin slice imaging. Vascular: Major intracranial vascular flow voids are stable since 2015. Skull and upper cervical spine: Stable, negative visible cervical spine and bone marrow signal (hyperostosis of the calvarium). Sinuses/Orbits: Stable and negative. Other: Mastoids are clear. Visible internal auditory structures appear normal. Negative scalp and face soft tissues. IMPRESSION: 1.  No acute intracranial abnormality. 2. 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 Scattered chronic microhemorrhages. Electronically Signed   By: Genevie Ann M.D.   On: 01/10/2020 16:16    Scheduled Meds: . amoxicillin-clavulanate  1 tablet Oral q1800  . aspirin  162 mg Oral Daily  . Chlorhexidine Gluconate Cloth  6 each Topical Q0600  . cyanocobalamin  1,000 mcg Intramuscular Q30 days  . doxercalciferol  3 mcg Intravenous Q M,W,F-HD  . doxycycline  100 mg Oral Q12H  . gabapentin  300 mg Oral Daily  . heparin injection (subcutaneous)  5,000 Units Subcutaneous Q8H  . insulin aspart  0-6 Units Subcutaneous TID WC  . insulin NPH Human  10 Units Subcutaneous BID AC  . labetalol  100  mg Oral BID  . levETIRAcetam  250 mg Oral BID  . multivitamin  1 tablet Oral QHS   Continuous Infusions:   LOS: 6 days   Marylu Lund, MD [facility] Pager On Amion  If 7PM-7AM, please contact night-coverage 01/12/2020, 3:41 PM

## 2020-01-12 NOTE — Consult Note (Signed)
Reason for Consult:Right foot infection Referring Physician: Sanjana Folz is an 79 y.o. female.  HPI: [name] was admitted a week ago with cellulitis of her right foot. She had been having severe sharp intermittent pain in that foot for a week prior. She was diagnosed with cellulitis and a MRI done then did not show any abscess or osteo. She had improved objectively though she says her symptoms have not changed. She was due for discharge today and her foot started draining copious amounts of purulent fluid and orthopedic surgery was consulted.  Past Medical History:  Diagnosis Date  . Anemia   . Cataract   . Chronic kidney disease   . CVA (cerebral infarction)   . Diabetes mellitus    Type 2  . Diabetes mellitus without complication ([place])   . Dialysis patient ([place])   . Dialysis patient ([place])    M, W, F  . Fistula    R arm  . GERD (gastroesophageal reflux disease)   . Hypertension   . Renal disorder   . Shortness of breath   . Stroke [facility])    right side weakness    Past Surgical History:  Procedure Laterality Date  . AV FISTULA PLACEMENT Right 09/08/2013   Procedure: CREATION OF RIGHT BRACHIAL CEPHALIC ARTERIOVENOUS FISTULA ;  Surgeon: Mal Misty, MD;  Location: [place];  Service: Vascular;  Laterality: Right;  . BASCILIC VEIN TRANSPOSITION Right 01/26/2014   Procedure: Right Arm BASILIC VEIN TRANSPOSITION;  Surgeon: Mal Misty, MD;  Location: [place];  Service: Vascular;  Laterality: Right;  . CATARACT EXTRACTION W/PHACO  11/20/2011   Procedure: CATARACT EXTRACTION PHACO AND INTRAOCULAR LENS PLACEMENT (IOC);  Surgeon: Tonny Branch, MD;  [facility];  Service: Ophthalmology;  Laterality: Right;  CDE 18.82  . CATARACT EXTRACTION W/PHACO Left 11/18/2012   Procedure: CATARACT EXTRACTION PHACO AND INTRAOCULAR LENS PLACEMENT (IOC);  Surgeon: Tonny Branch, MD;  Location: [facility];  Service: Ophthalmology;  Laterality: Left;  CDE: 18.97  . COLONOSCOPY N/A 02/09/2013    Procedure: COLONOSCOPY;  Surgeon: Rogene Houston, MD;  [facility];  Service: Endoscopy;  Laterality: N/A;  305-moved to 220 Ann to notify pt  . COLONOSCOPY N/A 07/08/2018   Procedure: COLONOSCOPY;  Surgeon: Rogene Houston, MD;  [facility];  Service: Endoscopy;  Laterality: N/A;  930  . INSERTION OF DIALYSIS CATHETER Right 06/24/2013   Procedure: INSERTION OF DIALYSIS CATHETER: Ultrasound guided;  Surgeon: Serafina Mitchell, MD;  Location: [place];  Service: Vascular;  Laterality: Right;  . LIGATION OF ARTERIOVENOUS  FISTULA Right 01/26/2014   Procedure: LIGATION OF ARTERIOVENOUS  FISTULA;  Surgeon: Mal Misty, MD;  Location: [place];  Service: Vascular;  Laterality: Right;  . 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 LOOP RECORDER IMPLANT N/A 07/21/2013   Procedure: LOOP RECORDER IMPLANT;  Surgeon: Evans Lance, MD;  Location: [facility] CATH LAB;  Service: Cardiovascular;  Laterality: N/A;  . POLYPECTOMY  07/08/2018   Procedure: POLYPECTOMY;  Surgeon: Rogene Houston, MD;  [facility];  Service: Endoscopy;;  Descending colon polyps x 2   . TEE WITHOUT CARDIOVERSION N/A 07/21/2013   Procedure: TRANSESOPHAGEAL ECHOCARDIOGRAM (TEE);  Surgeon: Dorothy Spark, MD;  Location: [facility] ENDOSCOPY;  Service: Cardiovascular;  Laterality: N/A;    Family History  Problem Relation Age of Onset  . Cancer Sister   . Cancer  Brother   . Anesthesia problems Neg Hx   . Hypotension Neg Hx   . Malignant hyperthermia Neg Hx   . Pseudochol deficiency Neg Hx     Social History:  reports that she has never smoked. She has never used smokeless tobacco. She reports that she does not drink alcohol and does not use drugs.  Allergies:  Allergies  Allergen Reactions  . Ambien [Zolpidem] Other (See Comments)    Hallucinations   . Reglan [Metoclopramide] Other (See Comments)    "makes me crazy"    Medications: I have reviewed the patient's  current medications.  Results for orders placed or performed during the hospital encounter of 01/05/20 (from the past 48 hour(s))  Glucose, capillary     Status: Abnormal   Collection Time: 01/10/20  5:25 PM  Result Value Ref Range   Glucose-Capillary 196 (H) 70 - 99 mg/dL    Comment: Glucose reference range applies only to samples taken after fasting for at least 8 hours.  Glucose, capillary     Status: Abnormal   Collection Time: 01/10/20  8:02 PM  Result Value Ref Range   Glucose-Capillary 222 (H) 70 - 99 mg/dL    Comment: Glucose reference range applies only to samples taken after fasting for at least 8 hours.  Basic metabolic panel     Status: Abnormal   Collection Time: 01/11/20  4:28 AM  Result Value Ref Range   Sodium 132 (L) 135 - 145 mmol/L   Potassium 3.9 3.5 - 5.1 mmol/L   Chloride 92 (L) 98 - 111 mmol/L   CO2 24 22 - 32 mmol/L   Glucose, Bld 226 (H) 70 - 99 mg/dL    Comment: Glucose reference range applies only to samples taken after fasting for at least 8 hours.   BUN 41 (H) 8 - 23 mg/dL   Creatinine, Ser 7.32 (H) 0.44 - 1.00 mg/dL   Calcium 8.0 (L) 8.9 - 10.3 mg/dL   GFR calc non Af Amer 5 (L) >60 mL/min   GFR calc Af Amer 6 (L) >60 mL/min   Anion gap 16 (H) 5 - 15    Comment: Performed at [facility] [address], [place], [place] [address]  Glucose, capillary     Status: Abnormal   Collection Time: 01/11/20  7:44 AM  Result Value Ref Range   Glucose-Capillary 195 (H) 70 - 99 mg/dL    Comment: Glucose reference range applies only to samples taken after fasting for at least 8 hours.  Glucose, capillary     Status: Abnormal   Collection Time: 01/11/20 11:58 AM  Result Value Ref Range   Glucose-Capillary 270 (H) 70 - 99 mg/dL    Comment: Glucose reference range applies only to samples taken after fasting for at least 8 hours.  CBC     Status: Abnormal   Collection Time: 01/11/20  2:00 PM  Result Value Ref Range   WBC 11.0 (H) 4.0 - 10.5 K/uL   RBC  3.44 (L) 3.87 - 5.11 MIL/uL   Hemoglobin 10.7 (L) 12.0 - 15.0 g/dL   HCT 31.0 (L) 36 - 46 %   MCV 90.1 80.0 - 100.0 fL   MCH 31.1 26.0 - 34.0 pg   MCHC 34.5 30.0 - 36.0 g/dL   RDW 14.7 11.5 - 15.5 %   Platelets 168 150 - 400 K/uL   nRBC 0.0 0.0 - 0.2 %    Comment: Performed at [facility], [place]  [address], [place], [place] [address]  Glucose, capillary     Status: Abnormal   Collection Time: 01/11/20  7:25 PM  Result Value Ref Range   Glucose-Capillary 107 (H) 70 - 99 mg/dL    Comment: Glucose reference range applies only to samples taken after fasting for at least 8 hours.  Glucose, capillary     Status: Abnormal   Collection Time: 01/11/20  9:39 PM  Result Value Ref Range   Glucose-Capillary 249 (H) 70 - 99 mg/dL    Comment: Glucose reference range applies only to samples taken after fasting for at least 8 hours.  Glucose, capillary     Status: Abnormal   Collection Time: 01/12/20  7:42 AM  Result Value Ref Range   Glucose-Capillary 206 (H) 70 - 99 mg/dL    Comment: Glucose reference range applies only to samples taken after fasting for at least 8 hours.  Glucose, capillary     Status: Abnormal   Collection Time: 01/12/20 11:44 AM  Result Value Ref Range   Glucose-Capillary 293 (H) 70 - 99 mg/dL    Comment: Glucose reference range applies only to samples taken after fasting for at least 8 hours.    MR BRAIN WO CONTRAST  Result Date: 01/10/2020 CLINICAL DATA:  79 year old female with recurrent seizure like activity. Started on Keppra. EXAM: MRI HEAD WITHOUT CONTRAST TECHNIQUE: Multiplanar, multiecho pulse sequences of the brain and surrounding structures were obtained without intravenous contrast. COMPARISON:  Brain MRI 08/20/2013.  Head CT 08/07/2019. FINDINGS: Brain: Generalized cerebral volume loss since 2015 with multifocal areas of confluent parenchymal T2 and FLAIR hyperintensity over the cerebral convexities likely post ischemic gliosis - including some probably  related to the scattered infarcts seen in 2015. Mostly white matter involvement although there is an area of cortical encephalomalacia in the left parietal lobe on series 11, image 19. SWI demonstrates of few subtle scattered chronic microhemorrhages superimposed, such as seen on series 16, image 30. There are are also chronic microhemorrhages in the left caudate and occasionally the brainstem. Small chronic bilateral cerebellar infarcts were acute in 2015. No restricted diffusion to suggest acute infarction. No midline shift, mass effect, evidence of mass lesion, ventriculomegaly, extra-axial collection or acute intracranial hemorrhage. Cervicomedullary junction and pituitary are within normal limits. Hippocampal formations and mesial temporal lobe structures are within normal limits on thin slice imaging. Vascular: Major intracranial vascular flow voids are stable since 2015. Skull and upper cervical spine: Stable, negative visible cervical spine and bone marrow signal (hyperostosis of the calvarium). Sinuses/Orbits: Stable and negative. Other: Mastoids are clear. Visible internal auditory structures appear normal. Negative scalp and face soft tissues. IMPRESSION: 1.  No acute intracranial abnormality. 2. 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 Scattered chronic microhemorrhages. Electronically Signed   By: Genevie Ann M.D.   On: 01/10/2020 16:16    Review of Systems  Constitutional: Negative for chills, diaphoresis and fever.  HENT: Negative for ear discharge, ear pain, hearing loss and tinnitus.   Eyes: Negative for photophobia and pain.  Respiratory: Negative for cough and shortness of breath.   Cardiovascular: Negative for chest pain.  Gastrointestinal: Negative for abdominal pain, nausea and vomiting.  Genitourinary: Negative for dysuria, flank pain,  frequency and urgency.  Musculoskeletal: Positive for arthralgias (Right foot). Negative for back pain, myalgias and neck pain.  Neurological: Negative for dizziness and  headaches.  Hematological: Does not bruise/bleed easily.  Psychiatric/Behavioral: The patient is not nervous/anxious.    Blood pressure (!) 153/59, pulse 77, temperature 98.1 F (36.7 C), resp. rate 17, height 5\' 8"  (1.727 m), weight 85.6 kg, SpO2 98 %. Physical Exam Constitutional:      General: She is not in acute distress.    Appearance: She is well-developed. She is not diaphoretic.  HENT:     Head: Normocephalic and atraumatic.  Eyes:     General: No scleral icterus.       Right eye: No discharge.        Left eye: No discharge.     Conjunctiva/sclera: Conjunctivae normal.  Cardiovascular:     Rate and Rhythm: Normal rate and regular rhythm.  Pulmonary:     Effort: Pulmonary effort is normal. No respiratory distress.  Musculoskeletal:     Cervical back: Normal range of motion.     Comments: LLE No traumatic wounds, ecchymosis, or rash  Bunion noted lateral 1st MTP joint, granulation proximal to that, could not express any fluid, NT  Sens DPN, SPN intact, TN paresthetic  Motor EHL, ext, flex, evers 5/5  DP hard to ascertain 2/2 frequent muscular fasiculations, PT 0, No significant edema  Skin:    General: Skin is warm and dry.  Neurological:     Mental Status: She is alert.  Psychiatric:        Behavior: Behavior normal.     Assessment/Plan: Right foot infection -- Will order ABI's. Dr. Sharol Given to evaluate this afternoon. May need another MRI. Multiple medical problems including ESRD on HD MWF, IDT2DM, history of CVA,seizure disorder not taking AEDs,and HTN -- per primary service    Lisette Abu, PA-C Orthopedic Surgery [phone] 01/12/2020, 12:50 PM

## 2020-01-12 NOTE — H&P (View-Only) (Signed)
ORTHOPAEDIC CONSULTATION  REQUESTING PHYSICIAN: Donne Hazel, MD  Chief Complaint: Pain ulceration and drainage medial great toe MTP joint on the right.  HPI: [name] is a 79 y.o. female who presents with ulceration with purulent drainage medial aspect of the right great toe MTP joint.  Patient complains of pain.  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  Past Medical History:  Diagnosis Date  . Anemia   . Cataract   . Chronic kidney disease   . CVA (cerebral infarction)   . Diabetes mellitus    Type 2  . Diabetes mellitus without complication ([place])   . Dialysis patient ([place])   . Dialysis patient ([place])    M, W, F  . Fistula    R arm  . GERD (gastroesophageal reflux disease)   . Hypertension   . Renal disorder   . Shortness of breath   . Stroke [facility])    right side weakness   Past Surgical History:  Procedure Laterality Date  . AV FISTULA PLACEMENT Right 09/08/2013   Procedure: CREATION OF RIGHT BRACHIAL CEPHALIC ARTERIOVENOUS FISTULA ;  Surgeon: Mal Misty, MD;  Location: [place];  Service: Vascular;  Laterality: Right;  . BASCILIC VEIN TRANSPOSITION Right 01/26/2014   Procedure: Right Arm BASILIC VEIN TRANSPOSITION;  Surgeon: Mal Misty, MD;  Location: [place];  Service: Vascular;  Laterality: Right;  . CATARACT EXTRACTION W/PHACO  11/20/2011   Procedure: CATARACT EXTRACTION PHACO AND INTRAOCULAR LENS PLACEMENT (IOC);  Surgeon: Tonny Branch, MD;  [facility];  Service: Ophthalmology;  Laterality: Right;  CDE 18.82  . CATARACT EXTRACTION W/PHACO Left 11/18/2012   Procedure: CATARACT EXTRACTION PHACO AND INTRAOCULAR LENS PLACEMENT (IOC);  Surgeon: Tonny Branch, MD;  [facility];  Service: Ophthalmology;  Laterality: Left;  CDE: 18.97  . COLONOSCOPY N/A 02/09/2013   Procedure: COLONOSCOPY;  Surgeon: Rogene Houston, MD;  [facility];  Service: Endoscopy;  Laterality: N/A;  305-moved to 220 Ann to notify pt  . COLONOSCOPY N/A 07/08/2018   Procedure: COLONOSCOPY;  Surgeon: Rogene Houston, MD;  [facility];  Service: Endoscopy;  Laterality: N/A;  930  . INSERTION OF DIALYSIS CATHETER Right 06/24/2013   Procedure: INSERTION OF DIALYSIS CATHETER: Ultrasound guided;  Surgeon: Serafina Mitchell, MD;  Location: [place];  Service: Vascular;  Laterality: Right;  . LIGATION OF ARTERIOVENOUS  FISTULA Right 01/26/2014   Procedure: LIGATION OF ARTERIOVENOUS  FISTULA;  Surgeon: Mal Misty, MD;  Location: [place];  Service: Vascular;  Laterality: Right;  . 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 LOOP RECORDER IMPLANT N/A 07/21/2013   Procedure: LOOP RECORDER IMPLANT;  Surgeon: Evans Lance, MD;  Location: [facility] CATH LAB;  Service: Cardiovascular;  Laterality: N/A;  . POLYPECTOMY  07/08/2018   Procedure: POLYPECTOMY;  Surgeon: Rogene Houston, MD;  [facility];  Service: Endoscopy;;  Descending colon polyps x 2   . TEE WITHOUT CARDIOVERSION N/A 07/21/2013   Procedure: TRANSESOPHAGEAL ECHOCARDIOGRAM (TEE);  Surgeon: Dorothy Spark, MD;  Location: [facility] ENDOSCOPY;  Service: Cardiovascular;  Laterality: N/A;   Social History   Socioeconomic History  . Marital status: Widowed    Spouse name: Not on file  . Number of children: Not on file  .  Years of education: Not on file  . Highest education level: Not on file  Occupational History  . Not on file  Tobacco Use  . Smoking status: Never Smoker  . Smokeless tobacco: Never Used  Vaping Use  . Vaping Use: Never used  Substance and Sexual Activity  . Alcohol use: No  . Drug use: No  . Sexual activity: Not on file  Other Topics Concern  . Not on file  Social History Narrative   ** Merged History Encounter **       Social Determinants of Health   Financial Resource Strain:   . Difficulty of Paying Living  Expenses:   Food Insecurity:   . Worried About Charity fundraiser in the Last Year:   . Arboriculturist in the Last Year:   Transportation Needs:   . Film/video editor (Medical):   Marland Kitchen Lack of Transportation (Non-Medical):   Physical Activity:   . Days of Exercise per Week:   . Minutes of Exercise per Session:   Stress:   . Feeling of Stress :   Social Connections:   . Frequency of Communication with Friends and Family:   . Frequency of Social Gatherings with Friends and Family:   . Attends Religious Services:   . Active Member of Clubs or Organizations:   . Attends Archivist Meetings:   Marland Kitchen Marital Status:    Family History  Problem Relation Age of Onset  . Cancer Sister   . Cancer Brother   . Anesthesia problems Neg Hx   . Hypotension Neg Hx   . Malignant hyperthermia Neg Hx   . Pseudochol deficiency Neg Hx    - negative except otherwise stated in the family history section Allergies  Allergen Reactions  . Ambien [Zolpidem] Other (See Comments)    Hallucinations   . Reglan [Metoclopramide] Other (See Comments)    "makes me crazy"   Prior to Admission medications   Medication Sig Start Date End Date Taking? Authorizing Provider  albuterol (PROVENTIL HFA;VENTOLIN HFA) 108 (90 Base) MCG/ACT inhaler Inhale 2 puffs into the lungs every 6 (six) hours as needed for wheezing or shortness of breath. 10/16/17  Yes Kathie Dike, MD  aspirin 81 MG chewable tablet Chew 2 tablets (162 mg total) by mouth daily. 07/09/18  Yes Rehman, Mechele Dawley, MD  BD INSULIN SYRINGE U/F 31G X 5/16" 0.3 ML MISC 2 (two) times daily. as directed 09/15/18  Yes [provider]  cyanocobalamin (,VITAMIN B-12,) 1000 MCG/ML injection Inject 1 mL into the muscle every 30 (thirty) days. 11/01/14  Yes [provider]  gabapentin (NEURONTIN) 300 MG capsule Take 300 mg by mouth daily.  10/29/18  Yes [provider]  ibuprofen (ADVIL,MOTRIN) 200 MG tablet Take 400 mg by mouth every  6 (six) hours as needed for headache or moderate pain.    Yes [provider]  insulin NPH Human (HUMULIN N,NOVOLIN N) 100 UNIT/ML injection Inject 14 Units into the skin 2 (two) times daily before a meal.    Yes [provider]  labetalol (NORMODYNE) 300 MG tablet Take 300 mg by mouth 2 (two) times daily. 11/06/14  Yes [provider]  levETIRAcetam (KEPPRA) 250 MG tablet Take 250 mg by mouth 2 (two) times daily.    Yes [provider]  lidocaine-prilocaine (EMLA) cream Apply 1 application topically every Monday, Wednesday, and Friday.  07/01/15  Yes [provider]  multivitamin (RENA-VIT) TABS tablet Take 1 tablet by  mouth at bedtime. 08/21/14  Yes Kirsteins, Luanna Salk, MD  sevelamer carbonate (RENVELA) 800 MG tablet Take 800 mg by mouth 3 (three) times daily with meals.  12/24/17  Yes [provider]   No results found. - pertinent xrays, CT, MRI studies were reviewed and independently interpreted  Positive ROS: All other systems have been reviewed and were otherwise negative with the exception of those mentioned in the HPI and as above.  Physical Exam: General: Alert, no acute distress Psychiatric: Patient is competent for consent with normal mood and affect Lymphatic: No axillary or cervical lymphadenopathy Cardiovascular: No pedal edema Respiratory: No cyanosis, no use of accessory musculature GI: No organomegaly, abdomen is soft and non-tender    Images:  @ENCIMAGES @  Labs:  Lab Results  Component Value Date   HGBA1C 7.7 (H) 01/05/2020   HGBA1C 7.5 (H) 10/14/2017   HGBA1C 9.8 (H) 06/01/2015   ESRSEDRATE 42 (H) 01/06/2020   CRP 20.3 (H) 01/06/2020   REPTSTATUS 01/10/2020 FINAL 01/05/2020   CULT  01/05/2020    NO GROWTH 5 DAYS Performed at [facility], [place] [address] [place], [place] [address]     Lab Results  Component Value Date   ALBUMIN 2.9 (L) 01/06/2020   ALBUMIN 2.8 (L) 01/06/2020   ALBUMIN 3.4 (L)  01/05/2020    Neurologic: Patient does not have protective sensation bilateral lower extremities.   MUSCULOSKELETAL:   Skin: Examination patient has a blister over the medial aspect of the right great toe MTP joint on the plantar aspect there is an old hard callus.  The blister is unroofed and patient has a large necrotic ulcer that extends down to the MTP joint.  The ulcer is 2 cm in diameter 1 cm deep.  Patient has muscle twitching in her foot it is hard to discern a palpable dorsalis pedis pulse.  Reviewing the ankle-brachial indices shows an ABI to the right lower extremity of the 92% with biphasic waveform.  Review of the MRI scan that was obtained a week ago shows no osteomyelitis, no abscess.  There is no ascending cellulitis.  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  Plan: Plan: Have discussed with the patient and her daughters recommendation to proceed with a right  first ray amputation risks and benefits were discussed including risk of the wound not healing risks of healing complicated with weightbearing and swelling.  Patient and family state they understand and wish to proceed with surgery tomorrow.  Anticipate surgery will be Friday afternoon.  Thank you for the consult and the opportunity to see Ms. Humberto Leep, MD [facility] [phone] 4:50 PM

## 2020-01-12 NOTE — Consult Note (Signed)
ORTHOPAEDIC CONSULTATION  REQUESTING PHYSICIAN: Donne Hazel, MD  Chief Complaint: Pain ulceration and drainage medial great toe MTP joint on the right.  HPI: [name] is a 79 y.o. female who presents with ulceration with purulent drainage medial aspect of the right great toe MTP joint.  Patient complains of pain.  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  Past Medical History:  Diagnosis Date  . Anemia   . Cataract   . Chronic kidney disease   . CVA (cerebral infarction)   . Diabetes mellitus    Type 2  . Diabetes mellitus without complication ([place])   . Dialysis patient ([place])   . Dialysis patient ([place])    M, W, F  . Fistula    R arm  . GERD (gastroesophageal reflux disease)   . Hypertension   . Renal disorder   . Shortness of breath   . Stroke [facility])    right side weakness   Past Surgical History:  Procedure Laterality Date  . AV FISTULA PLACEMENT Right 09/08/2013   Procedure: CREATION OF RIGHT BRACHIAL CEPHALIC ARTERIOVENOUS FISTULA ;  Surgeon: Mal Misty, MD;  Location: [place];  Service: Vascular;  Laterality: Right;  . BASCILIC VEIN TRANSPOSITION Right 01/26/2014   Procedure: Right Arm BASILIC VEIN TRANSPOSITION;  Surgeon: Mal Misty, MD;  Location: [place];  Service: Vascular;  Laterality: Right;  . CATARACT EXTRACTION W/PHACO  11/20/2011   Procedure: CATARACT EXTRACTION PHACO AND INTRAOCULAR LENS PLACEMENT (IOC);  Surgeon: Tonny Branch, MD;  Location: [facility];  Service: Ophthalmology;  Laterality: Right;  CDE 18.82  . CATARACT EXTRACTION W/PHACO Left 11/18/2012   Procedure: CATARACT EXTRACTION PHACO AND INTRAOCULAR LENS PLACEMENT (IOC);  Surgeon: Tonny Branch, MD;  Location: [facility];  Service: Ophthalmology;  Laterality: Left;  CDE: 18.97  . COLONOSCOPY N/A 02/09/2013   Procedure: COLONOSCOPY;  Surgeon: Rogene Houston, MD;  [facility];  Service: Endoscopy;  Laterality: N/A;  305-moved to 220 Ann to notify pt  . COLONOSCOPY N/A 07/08/2018   Procedure: COLONOSCOPY;  Surgeon: Rogene Houston, MD;  [facility];  Service: Endoscopy;  Laterality: N/A;  930  . INSERTION OF DIALYSIS CATHETER Right 06/24/2013   Procedure: INSERTION OF DIALYSIS CATHETER: Ultrasound guided;  Surgeon: Serafina Mitchell, MD;  Location: [place];  Service: Vascular;  Laterality: Right;  . LIGATION OF ARTERIOVENOUS  FISTULA Right 01/26/2014   Procedure: LIGATION OF ARTERIOVENOUS  FISTULA;  Surgeon: Mal Misty, MD;  Location: [place];  Service: Vascular;  Laterality: Right;  . 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 LOOP RECORDER IMPLANT N/A 07/21/2013   Procedure: LOOP RECORDER IMPLANT;  Surgeon: Evans Lance, MD;  Location: [facility] CATH LAB;  Service: Cardiovascular;  Laterality: N/A;  . POLYPECTOMY  07/08/2018   Procedure: POLYPECTOMY;  Surgeon: Rogene Houston, MD;  [facility];  Service: Endoscopy;;  Descending colon polyps x 2   . TEE WITHOUT CARDIOVERSION N/A 07/21/2013   Procedure: TRANSESOPHAGEAL ECHOCARDIOGRAM (TEE);  Surgeon: Dorothy Spark, MD;  Location: [facility] ENDOSCOPY;  Service: Cardiovascular;  Laterality: N/A;   Social History   Socioeconomic History  . Marital status: Widowed    Spouse name: Not on file  . Number of children: Not on file  .  Years of education: Not on file  . Highest education level: Not on file  Occupational History  . Not on file  Tobacco Use  . Smoking status: Never Smoker  . Smokeless tobacco: Never Used  Vaping Use  . Vaping Use: Never used  Substance and Sexual Activity  . Alcohol use: No  . Drug use: No  . Sexual activity: Not on file  Other Topics Concern  . Not on file  Social History Narrative   ** Merged History Encounter **       Social Determinants of Health   Financial Resource Strain:   . Difficulty of Paying Living  Expenses:   Food Insecurity:   . Worried About Charity fundraiser in the Last Year:   . Arboriculturist in the Last Year:   Transportation Needs:   . Film/video editor (Medical):   Marland Kitchen Lack of Transportation (Non-Medical):   Physical Activity:   . Days of Exercise per Week:   . Minutes of Exercise per Session:   Stress:   . Feeling of Stress :   Social Connections:   . Frequency of Communication with Friends and Family:   . Frequency of Social Gatherings with Friends and Family:   . Attends Religious Services:   . Active Member of Clubs or Organizations:   . Attends Archivist Meetings:   Marland Kitchen Marital Status:    Family History  Problem Relation Age of Onset  . Cancer Sister   . Cancer Brother   . Anesthesia problems Neg Hx   . Hypotension Neg Hx   . Malignant hyperthermia Neg Hx   . Pseudochol deficiency Neg Hx    - negative except otherwise stated in the family history section Allergies  Allergen Reactions  . Ambien [Zolpidem] Other (See Comments)    Hallucinations   . Reglan [Metoclopramide] Other (See Comments)    "makes me crazy"   Prior to Admission medications   Medication Sig Start Date End Date Taking? Authorizing Provider  albuterol (PROVENTIL HFA;VENTOLIN HFA) 108 (90 Base) MCG/ACT inhaler Inhale 2 puffs into the lungs every 6 (six) hours as needed for wheezing or shortness of breath. 10/16/17  Yes Kathie Dike, MD  aspirin 81 MG chewable tablet Chew 2 tablets (162 mg total) by mouth daily. 07/09/18  Yes Rehman, Mechele Dawley, MD  BD INSULIN SYRINGE U/F 31G X 5/16" 0.3 ML MISC 2 (two) times daily. as directed 09/15/18  Yes [provider]  cyanocobalamin (,VITAMIN B-12,) 1000 MCG/ML injection Inject 1 mL into the muscle every 30 (thirty) days. 11/01/14  Yes [provider]  gabapentin (NEURONTIN) 300 MG capsule Take 300 mg by mouth daily.  10/29/18  Yes [provider]  ibuprofen (ADVIL,MOTRIN) 200 MG tablet Take 400 mg by mouth every  6 (six) hours as needed for headache or moderate pain.    Yes [provider]  insulin NPH Human (HUMULIN N,NOVOLIN N) 100 UNIT/ML injection Inject 14 Units into the skin 2 (two) times daily before a meal.    Yes [provider]  labetalol (NORMODYNE) 300 MG tablet Take 300 mg by mouth 2 (two) times daily. 11/06/14  Yes [provider]  levETIRAcetam (KEPPRA) 250 MG tablet Take 250 mg by mouth 2 (two) times daily.    Yes [provider]  lidocaine-prilocaine (EMLA) cream Apply 1 application topically every Monday, Wednesday, and Friday.  07/01/15  Yes [provider]  multivitamin (RENA-VIT) TABS tablet Take 1 tablet by  mouth at bedtime. 08/21/14  Yes Kirsteins, Luanna Salk, MD  sevelamer carbonate (RENVELA) 800 MG tablet Take 800 mg by mouth 3 (three) times daily with meals.  12/24/17  Yes [provider]   No results found. - pertinent xrays, CT, MRI studies were reviewed and independently interpreted  Positive ROS: All other systems have been reviewed and were otherwise negative with the exception of those mentioned in the HPI and as above.  Physical Exam: General: Alert, no acute distress Psychiatric: Patient is competent for consent with normal mood and affect Lymphatic: No axillary or cervical lymphadenopathy Cardiovascular: No pedal edema Respiratory: No cyanosis, no use of accessory musculature GI: No organomegaly, abdomen is soft and non-tender    Images:  @ENCIMAGES @  Labs:  Lab Results  Component Value Date   HGBA1C 7.7 (H) 01/05/2020   HGBA1C 7.5 (H) 10/14/2017   HGBA1C 9.8 (H) 06/01/2015   ESRSEDRATE 42 (H) 01/06/2020   CRP 20.3 (H) 01/06/2020   REPTSTATUS 01/10/2020 FINAL 01/05/2020   CULT  01/05/2020    NO GROWTH 5 DAYS Performed at [facility], [place] [address] [place], [place] [address]     Lab Results  Component Value Date   ALBUMIN 2.9 (L) 01/06/2020   ALBUMIN 2.8 (L) 01/06/2020   ALBUMIN 3.4 (L)  01/05/2020    Neurologic: Patient does not have protective sensation bilateral lower extremities.   MUSCULOSKELETAL:   Skin: Examination patient has a blister over the medial aspect of the right great toe MTP joint on the plantar aspect there is an old hard callus.  The blister is unroofed and patient has a large necrotic ulcer that extends down to the MTP joint.  The ulcer is 2 cm in diameter 1 cm deep.  Patient has muscle twitching in her foot it is hard to discern a palpable dorsalis pedis pulse.  Reviewing the ankle-brachial indices shows an ABI to the right lower extremity of the 92% with biphasic waveform.  Review of the MRI scan that was obtained a week ago shows no osteomyelitis, no abscess.  There is no ascending cellulitis.  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  Plan: Plan: Have discussed with the patient and her daughters recommendation to proceed with a right  first ray amputation risks and benefits were discussed including risk of the wound not healing risks of healing complicated with weightbearing and swelling.  Patient and family state they understand and wish to proceed with surgery tomorrow.  Anticipate surgery will be Friday afternoon.  Thank you for the consult and the opportunity to see Ms. Humberto Leep, MD [facility] [phone] 4:50 PM

## 2020-01-13 ENCOUNTER — Inpatient Hospital Stay (HOSPITAL_COMMUNITY): Payer: Medicare Other | Admitting: Certified Registered Nurse Anesthetist

## 2020-01-13 ENCOUNTER — Encounter (HOSPITAL_COMMUNITY)
Admission: EM | Disposition: A | Discharge status: Home Health | Payer: Medicare Other | Source: Ambulatory Visit | Attending: Internal Medicine

## 2020-01-13 ENCOUNTER — Encounter (HOSPITAL_COMMUNITY): Payer: Self-pay | Admitting: Internal Medicine

## 2020-01-13 DIAGNOSIS — N186 End stage renal disease: Secondary | ICD-10-CM | POA: Diagnosis not present

## 2020-01-13 DIAGNOSIS — E519 Thiamine deficiency, unspecified: Secondary | ICD-10-CM | POA: Diagnosis not present

## 2020-01-13 DIAGNOSIS — I120 Hypertensive chronic kidney disease with stage 5 chronic kidney disease or end stage renal disease: Secondary | ICD-10-CM | POA: Diagnosis not present

## 2020-01-13 DIAGNOSIS — L03115 Cellulitis of right lower limb: Secondary | ICD-10-CM | POA: Diagnosis not present

## 2020-01-13 DIAGNOSIS — I96 Gangrene, not elsewhere classified: Secondary | ICD-10-CM

## 2020-01-13 DIAGNOSIS — E1122 Type 2 diabetes mellitus with diabetic chronic kidney disease: Secondary | ICD-10-CM | POA: Diagnosis not present

## 2020-01-13 DIAGNOSIS — Z992 Dependence on renal dialysis: Secondary | ICD-10-CM | POA: Diagnosis not present

## 2020-01-13 HISTORY — PX: AMPUTATION: SHX166

## 2020-01-13 LAB — POCT I-STAT, CHEM 8
BUN: 40 mg/dL — ABNORMAL HIGH (ref 8–23)
Calcium, Ion: 1.15 mmol/L (ref 1.15–1.40)
Chloride: 96 mmol/L — ABNORMAL LOW (ref 98–111)
Creatinine, Ser: 8.7 mg/dL — ABNORMAL HIGH (ref 0.44–1.00)
Glucose, Bld: 105 mg/dL — ABNORMAL HIGH (ref 70–99)
HCT: 41 % (ref 36.0–46.0)
Hemoglobin: 13.9 g/dL (ref 12.0–15.0)
Potassium: 4.4 mmol/L (ref 3.5–5.1)
Sodium: 136 mmol/L (ref 135–145)
TCO2: 30 mmol/L (ref 22–32)

## 2020-01-13 LAB — GLUCOSE, CAPILLARY
Glucose-Capillary: 101 mg/dL — ABNORMAL HIGH (ref 70–99)
Glucose-Capillary: 111 mg/dL — ABNORMAL HIGH (ref 70–99)
Glucose-Capillary: 108 mg/dL — ABNORMAL HIGH (ref 70–99)
Glucose-Capillary: 116 mg/dL — ABNORMAL HIGH (ref 70–99)
Glucose-Capillary: 104 mg/dL — ABNORMAL HIGH (ref 70–99)

## 2020-01-13 SURGERY — AMPUTATION, FOOT, RAY
Anesthesia: Monitor Anesthesia Care | Laterality: Right

## 2020-01-13 MED ORDER — HYDROMORPHONE HCL 1 MG/ML IJ SOLN
0.5000 mg | INTRAMUSCULAR | Status: DC | PRN
  Administered 2020-01-13 – 2020-01-14 (×4): 1 mg via INTRAVENOUS
  Filled 2020-01-13 (×3): qty 1

## 2020-01-13 MED ORDER — MIDAZOLAM HCL 2 MG/2ML IJ SOLN
INTRAMUSCULAR | Status: AC
  Filled 2020-01-13: qty 2

## 2020-01-13 MED ORDER — HYDROMORPHONE HCL 1 MG/ML IJ SOLN
INTRAMUSCULAR | Status: AC
  Filled 2020-01-13: qty 1

## 2020-01-13 MED ORDER — CHLORHEXIDINE GLUCONATE 0.12 % MT SOLN
OROMUCOSAL | Status: AC
  Administered 2020-01-13: 15 mL via OROMUCOSAL
  Filled 2020-01-13: qty 15

## 2020-01-13 MED ORDER — FENTANYL CITRATE (PF) 100 MCG/2ML IJ SOLN
INTRAMUSCULAR | Status: AC
  Filled 2020-01-13: qty 2

## 2020-01-13 MED ORDER — SODIUM CHLORIDE 0.9 % IV SOLN: INTRAVENOUS | Status: DC

## 2020-01-13 MED ORDER — PROPOFOL 10 MG/ML IV BOLUS
INTRAVENOUS | Status: AC
  Filled 2020-01-13: qty 20

## 2020-01-13 MED ORDER — ORAL CARE MOUTH RINSE: 15.0000 mL | Freq: Once | OROMUCOSAL | Status: AC

## 2020-01-13 MED ORDER — FENTANYL CITRATE (PF) 100 MCG/2ML IJ SOLN
25.0000 ug | INTRAMUSCULAR | Status: DC | PRN
  Administered 2020-01-13: 25 ug via INTRAVENOUS

## 2020-01-13 MED ORDER — CHLORHEXIDINE GLUCONATE 0.12 % MT SOLN: 15.0000 mL | Freq: Once | OROMUCOSAL | Status: AC

## 2020-01-13 MED ORDER — PROPOFOL 10 MG/ML IV BOLUS
INTRAVENOUS | Status: DC | PRN
  Administered 2020-01-13: 20 mg via INTRAVENOUS
  Administered 2020-01-13: 30 mg via INTRAVENOUS

## 2020-01-13 MED ORDER — CEFAZOLIN SODIUM-DEXTROSE 2-4 GM/100ML-% IV SOLN
2.0000 g | INTRAVENOUS | Status: DC
  Filled 2020-01-13: qty 100

## 2020-01-13 MED ORDER — SODIUM CHLORIDE 0.9 % IV SOLN: INTRAVENOUS | Status: DC | PRN

## 2020-01-13 MED ORDER — FENTANYL CITRATE (PF) 250 MCG/5ML IJ SOLN
INTRAMUSCULAR | Status: AC
  Filled 2020-01-13: qty 5

## 2020-01-13 MED ORDER — AMISULPRIDE (ANTIEMETIC) 5 MG/2ML IV SOLN
10.0000 mg | Freq: Once | INTRAVENOUS | Status: DC | PRN
Start: 2020-01-13 — End: 2020-01-13

## 2020-01-13 MED ORDER — LIDOCAINE HCL (PF) 1 % IJ SOLN
INTRAMUSCULAR | Status: AC
  Filled 2020-01-13: qty 30

## 2020-01-13 MED ORDER — CEFAZOLIN SODIUM-DEXTROSE 2-3 GM-%(50ML) IV SOLR
INTRAVENOUS | Status: DC | PRN
  Administered 2020-01-13: 2 g via INTRAVENOUS

## 2020-01-13 MED ORDER — 0.9 % SODIUM CHLORIDE (POUR BTL) OPTIME
TOPICAL | Status: DC | PRN
  Administered 2020-01-13: 1000 mL

## 2020-01-13 MED ORDER — OXYCODONE HCL 5 MG PO TABS
5.0000 mg | ORAL_TABLET | ORAL | Status: DC | PRN
  Administered 2020-01-14 (×2): 10 mg via ORAL
  Administered 2020-01-14: 5 mg via ORAL
  Administered 2020-01-15 – 2020-01-17 (×7): 10 mg via ORAL
  Administered 2020-01-17: 5 mg via ORAL
  Filled 2020-01-13: qty 1
  Filled 2020-01-13: qty 2
  Filled 2020-01-13: qty 1
  Filled 2020-01-13 (×4): qty 2
  Filled 2020-01-13: qty 1
  Filled 2020-01-13: qty 2
  Filled 2020-01-13: qty 1
  Filled 2020-01-13 (×3): qty 2

## 2020-01-13 MED ORDER — DOCUSATE SODIUM 100 MG PO CAPS
100.0000 mg | ORAL_CAPSULE | Freq: Two times a day (BID) | ORAL | Status: DC
  Administered 2020-01-14 – 2020-01-17 (×8): 100 mg via ORAL
  Filled 2020-01-13 (×8): qty 1

## 2020-01-13 MED ORDER — OXYCODONE HCL 5 MG PO TABS
ORAL_TABLET | ORAL | Status: AC
  Filled 2020-01-13: qty 2

## 2020-01-13 MED ORDER — FENTANYL CITRATE (PF) 100 MCG/2ML IJ SOLN
INTRAMUSCULAR | Status: DC | PRN
  Administered 2020-01-13: 50 ug via INTRAVENOUS
  Administered 2020-01-13: 25 ug via INTRAVENOUS

## 2020-01-13 SURGICAL SUPPLY — 35 items
BLADE SAW SGTL MED 73X18.5 STR (BLADE) IMPLANT
BLADE SURG 21 STRL SS (BLADE) ×3 IMPLANT
BNDG COHESIVE 4X5 TAN STRL (GAUZE/BANDAGES/DRESSINGS) ×3 IMPLANT
BNDG COHESIVE 6X5 TAN STRL LF (GAUZE/BANDAGES/DRESSINGS) ×2 IMPLANT
BNDG ELASTIC 6X10 VLCR STRL LF (GAUZE/BANDAGES/DRESSINGS) ×2 IMPLANT
BNDG GAUZE ELAST 4 BULKY (GAUZE/BANDAGES/DRESSINGS) ×3 IMPLANT
COVER SURGICAL LIGHT HANDLE (MISCELLANEOUS) ×6 IMPLANT
COVER WAND RF STERILE (DRAPES) ×3 IMPLANT
DRAPE U-SHAPE 47X51 STRL (DRAPES) ×8 IMPLANT
DRSG ADAPTIC 3X8 NADH LF (GAUZE/BANDAGES/DRESSINGS) ×3 IMPLANT
DRSG PAD ABDOMINAL 8X10 ST (GAUZE/BANDAGES/DRESSINGS) ×6 IMPLANT
DURAPREP 26ML APPLICATOR (WOUND CARE) ×3 IMPLANT
ELECT REM PT RETURN 9FT ADLT (ELECTROSURGICAL) ×3
ELECTRODE REM PT RTRN 9FT ADLT (ELECTROSURGICAL) ×1 IMPLANT
GAUZE SPONGE 4X4 12PLY STRL (GAUZE/BANDAGES/DRESSINGS) ×3 IMPLANT
GAUZE SPONGE 4X4 12PLY STRL LF (GAUZE/BANDAGES/DRESSINGS) ×2 IMPLANT
GLOVE BIOGEL PI IND STRL 9 (GLOVE) ×1 IMPLANT
GLOVE BIOGEL PI INDICATOR 9 (GLOVE) ×2
GLOVE SURG ORTHO 9.0 STRL STRW (GLOVE) ×3 IMPLANT
GOWN STRL REUS W/ TWL XL LVL3 (GOWN DISPOSABLE) ×2 IMPLANT
GOWN STRL REUS W/TWL XL LVL3 (GOWN DISPOSABLE) ×6
KIT BASIN OR (CUSTOM PROCEDURE TRAY) ×3 IMPLANT
KIT TURNOVER KIT B (KITS) ×3 IMPLANT
NS IRRIG 1000ML POUR BTL (IV SOLUTION) ×3 IMPLANT
PACK ORTHO EXTREMITY (CUSTOM PROCEDURE TRAY) ×3 IMPLANT
PAD ABD 8X10 STRL (GAUZE/BANDAGES/DRESSINGS) ×2 IMPLANT
PAD ARMBOARD 7.5X6 YLW CONV (MISCELLANEOUS) ×6 IMPLANT
PADDING CAST COTTON 2X4 NS (CAST SUPPLIES) ×2 IMPLANT
PADDING CAST COTTON 6X4 STRL (CAST SUPPLIES) ×2 IMPLANT
STOCKINETTE IMPERVIOUS LG (DRAPES) IMPLANT
SUT ETHILON 2 0 PSLX (SUTURE) ×3 IMPLANT
TOWEL GREEN STERILE (TOWEL DISPOSABLE) ×3 IMPLANT
TUBE CONNECTING 12'X1/4 (SUCTIONS) ×1
TUBE CONNECTING 12X1/4 (SUCTIONS) ×2 IMPLANT
YANKAUER SUCT BULB TIP NO VENT (SUCTIONS) ×3 IMPLANT

## 2020-01-13 NOTE — Anesthesia Procedure Notes (Signed)
Procedure Name: MAC Date/Time: 01/13/2020 3:51 PM Performed by: Eligha Bridegroom, CRNA Pre-anesthesia Checklist: Patient identified, Emergency Drugs available, Suction available, Patient being monitored and Timeout performed Patient Re-evaluated:Patient Re-evaluated prior to induction Oxygen Delivery Method: Nasal cannula Preoxygenation: Pre-oxygenation with 100% oxygen Induction Type: IV induction

## 2020-01-13 NOTE — Progress Notes (Signed)
PROGRESS NOTE    [name]  HKV:[id number] DOB: 12/18/1940 DOA: 01/05/2020 PCP: [name], MD    Brief Narrative:  79 y.o.femalewith medical history significant forESRD on HD MWF, IDT2DM, history of CVA, seizure disorder not taking AEDs, HTN, who presents to [facility] ED for evaluation of right foot cellulitis. Follows with her podiatrist regularly for management of an ulcer on the plantar surface of the right foot at the base of the first MTP. She had been wearing a boot for several weeks and noticed some swelling in her right leg since then. She went to see her podiatrist on 01/05/2020 who felt she was developing cellulitis of the right foot. She was subsequently sent to the ED for further evaluation and management.  In the ED, Temp 99.9 Fahrenheit, WBC 23.6K, hemoglobin 12.4K, platelets 83,000 (171,000 previously on 05/15/2019), lactic acid 1.4, sodium 136, potassium 4.6, bicarb 27, BUN 26, creatinine 6.66.  SARS-CoV-2 PCR negative. Blood cultures x2 negative to date.  Right foot x-ray shows soft tissue swelling with ulcer/wound along plantar aspect of the foot at the level of the first MTP. No definite acute osseous abnormality seen.  MRI right foot negative for osteomyelitis.  Poor dorsalis pedis pulses.  [place] ABI, B/L + PAD.  Daughter in the room requests referral to a different podiatrist.  TOC consulted to assist with this request.  Hospital course complicated by possible seizure activity, the morning of 01/09/2020, for which an EEG was done and neurology was consulted.  Discussed with neurology, Dr, Hortense Ramal.  She recommended continue home dose Keppra 250 mg twice daily and follow-up with neurology outpatient if MRI unremarkable for any acute intracranial findings.  No reported recurrence of seizure-like activities.  Assessment & Plan:   Principal Problem:   Cellulitis of right foot Active Problems:   Diabetes mellitus, type 2 (HCC)   Leukocytosis   Hypertension  associated with diabetes ([place])   ESRD (end stage renal disease) on dialysis ([place])   History of CVA (cerebrovascular accident)   Thrombocytopenia ([place])   Diabetic ulcer of right foot associated with diabetes mellitus due to underlying condition ([place])   Gangrene of toe of right foot ([place])   Sepsis, improving, secondary to cellulitis of right foot associated with diabetic ulcer, osteomyelitis ruled out. Presented with leukocytosis 20K, tachycardia, tachypnea, cellulitis distal dorsal right foot on Xray and MRI. Has plantar diabetic ulcer at base of first MTP without obvious drainage.  ESR 42  and CRP 20 No evidence of DVT on bilateral Doppler ultrasound No evidence of osteomyelitis on MRI right foot. Leukocytosis has improved Initially received Rocephin, then switched to IV Zosyn and IV vancomycin Started Augmentin on 01/10/2020 On 7/29, noted to have purulent drainage around foot wound Consulted orthopedic surgery, appreciate input.  Patient now status post first ray amputation Anticipate PT eval postoperatively  Seizure disorder Generalized convulsions 7/27 AM.  Per her daughter ceased to take her AED Keppra about 1 year ago, did not return to neuro for refills.  EEG ordered, no epileptiform activity MRI brain ordered,  findings of fairly extensive post ischemic gliosis in the superior frontal and parietal lobes, much of which may be the sequela of bilateral infarct seen in 2015 Continue Keppra to 50 mg twice daily as recommended by neurology  Dr. [place] Crane discussed with neurology, Dr, Hortense Ramal.  She recommended continue home dose Keppra 250 mg twice daily and follow-up with neurology outpatient if MRI unremarkable for any acute intracranial findings.  We will then discharge from a  neurological standpoint.  No reported recurrence of seizure activities.  Continue seizure precautions. EEG recently reviewed, no evidence of seizures. Pt to f/u with neuro as outpt  Peripheral arterial disease seen  on bilateral lower extremity vascular ultrasound ABI done on 01/08/20, showing mild right lower extremity arterial disease, moderate left lower extremity arterial disease. Continue aspirin, 162 mg daily Possibly not on statin due to ESRD. Will need to follow-up with a cardiologist when discharged  History of CVA: Continue home aspirin 162 mg daily, monitor platelet counts as above. Not currently on statin therapy possibly due to ESRD.  Acute hyperkalemia, resolved with hemodialysis, in the setting of ESRD Serum potassium 5.8> 4.5> 5.2> 3.4 Electrolytes addressed by hemodialysis. Continue HD as per nephrology  Mild hypovolemic hyponatremia Volume status managed with hemodialysis Sodium had improved since undergoing dialysis  Bilateral lower extremity edema, resolved post hemodialysis, secondary to volume overload on exam Presented with 1+ pitting edema in lower extremities bilaterally, has improved after hemodialysis. No evidence of DVT on bilateral Doppler ultrasound Advised to elevate her legs when at rest  ESRD on HD MWF: Seen by Dr. Candiss Norse, appreciate nephrology's assistance. Continue hemodialysis as tolerated  Thrombocytopenia secondary to acute illness, improving: No evidence of bleeding Plt now normalized  Insulin-dependent type 2 diabetes, with hypoglycemia : A1C 7.2 on 01/05/20 Hypoglycemic AM of 7/26 Now on 2 units twice daily Lantus Cont to titrate insulin with goal of euglycemia  Hypertension: Blood pressure is at goal Continue home labetalol.  Hydralazine as needed. Continue to monitor BP for now  Vitamin B12 deficiency Continue monthly B12 IM injection after hemodialysis 01/09/2020.   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 01/13/20 1440 01/14/20 0559   01/10/20 1800  [MAR Hold]  amoxicillin-clavulanate (AUGMENTIN) 500-125 MG per tablet 500 mg     Discontinue     (MAR Hold since Fri 01/13/2020 at 1439.Hold Reason: Transfer to a Procedural area.)   1 tablet Oral Daily-1800 01/09/20 1032     01/10/20 1000  [MAR Hold]  doxycycline (VIBRA-TABS) tablet 100 mg     Discontinue     (MAR Hold since Fri 01/13/2020 at 1439.Hold Reason: Transfer to a Procedural area.)   100 mg Oral Every 12 hours 01/09/20 1032     01/09/20 1200  vancomycin (VANCOCIN) IVPB 750 mg/150 ml premix        750 mg 150 mL/hr over 60 Minutes Intravenous Every M-W-F (Hemodialysis) 01/08/20 0557 01/09/20 1802   01/08/20 1400  piperacillin-tazobactam (ZOSYN) IVPB 2.25 g        2.25 g 100 mL/hr over 30 Minutes Intravenous Every 8 hours 01/08/20 0557 01/09/20 2359   01/08/20 0600  piperacillin-tazobactam (ZOSYN) IVPB 3.375 g        3.375 g 100 mL/hr over 30 Minutes Intravenous  Once 01/08/20 0550 01/08/20 0654  01/08/20 0600  vancomycin (VANCOCIN) IVPB 1000 mg/200 mL premix  Status:  Discontinued        1,000 mg 200 mL/hr over 60 Minutes Intravenous  Once 01/08/20 0550 01/08/20 0555   01/08/20 0600  vancomycin (VANCOREADY) IVPB 1750 mg/350 mL        1,750 mg 175 mL/hr over 120 Minutes Intravenous  Once 01/08/20 0557 01/08/20 1044   01/06/20 0800  cefTRIAXone (ROCEPHIN) 1 g in sodium chloride 0.9 % 100 mL IVPB  Status:  Discontinued        1 g 200 mL/hr over 30 Minutes Intravenous Every 24 hours 01/05/20 1906 01/06/20 1047    01/05/20 1800  cefTRIAXone (ROCEPHIN) 2 g in sodium chloride 0.9 % 100 mL IVPB  Status:  Discontinued        2 g 200 mL/hr over 30 Minutes Intravenous Every 24 hours 01/05/20 1752 01/05/20 1906      Subjective: Seen this morning prior to surgery, patient feeling anxious about her surgery  Objective: Vitals:   01/12/20 2209 01/13/20 0431 01/13/20 1341 01/13/20 1615  BP: (!) 158/56 (!) 153/65 (!) 165/67 (!) 145/63  Pulse: 79 71 74 76  Resp: 15 16 16 15   Temp: 98.9 F (37.2 C) 97.6 F (36.4 C) 98.2 F (36.8 C) (!) 97.5 F (36.4 C)  TempSrc:   Oral   SpO2: 98% 100% 100% 100%  Weight:      Height:        Intake/Output Summary (Last 24 hours) at 01/13/2020 1634 Last data filed at 01/13/2020 1617 Gross per 24 hour  Intake 700 ml  Output --  Net 700 ml   Filed Weights   01/11/20 1415 01/11/20 1819 01/12/20 0504  Weight: 86.3 kg 83.3 kg 85.6 kg    Examination: General exam: Conversant, in no acute distress Respiratory system: normal chest rise, clear, no audible wheezing Cardiovascular system: regular rhythm, s1-s2 Gastrointestinal system: Nondistended, nontender, pos BS Central nervous system: No seizures, no tremors Extremities: No cyanosis, dressings in place in RLE Skin: No rashes, no pallor Psychiatry: Affect normal // no auditory hallucinations   Data Reviewed: I have personally reviewed following labs and imaging studies  CBC: Recent Labs  Lab 01/07/20 0752 01/07/20 0752 01/08/20 0201 01/08/20 0201 01/09/20 0430 01/09/20 1450 01/10/20 0352 01/11/20 1400 01/13/20 1531  WBC 15.4*   < > 12.4*  --  11.3* 11.2* 9.8 11.0*  --   NEUTROABS 12.4*  --  9.3*  --  8.6*  --  6.9  --   --   HGB 11.1*   < > 11.3*   < > 10.9* 10.4* 11.8* 10.7* 13.9  HCT 32.8*   < > 33.0*   < > 31.6* 30.3* 34.1* 31.0* 41.0  MCV 90.6   < > 91.7  --  90.5 89.4 90.7 90.1  --   PLT 98*   < > 113*  --  145* 132* 129* 168  --    < > = values in this interval not displayed.   Basic Metabolic  Panel: Recent Labs  Lab 01/06/20 1827 01/06/20 1827 01/07/20 0752 01/07/20 0752 01/08/20 0201 01/09/20 0430 01/10/20 0352 01/11/20 0428 01/13/20 1531  NA 136   < > 135   < > 133* 133* 134* 132* 136  K 4.1   < > 4.3   < > 4.5 5.2* 3.4* 3.9 4.4  CL 94*   < > 96*   < > 94* 95* 95* 92* 96*  CO2 27   < >  28  --  26 21* 28 24  --   GLUCOSE 124*   < > 198*   < > 187* 149* 137* 226* 105*  BUN 17   < > 28*   < > 43* 61* 25* 41* 40*  CREATININE 4.53*   < > 5.77*   < > 7.33* 9.42* 5.44* 7.32* 8.70*  CALCIUM 8.9   < > 8.3*  --  8.2* 8.0* 8.1* 8.0*  --   PHOS 3.2  --   --   --   --   --   --   --   --    < > = values in this interval not displayed.   GFR: Estimated Creatinine Clearance: 5.9 mL/min (A) (by C-G formula based on SCr of 8.7 mg/dL (H)). Liver Function Tests: Recent Labs  Lab 01/06/20 1827  ALBUMIN 2.9*   No results for input(s): LIPASE, AMYLASE in the last 168 hours. No results for input(s): AMMONIA in the last 168 hours. Coagulation Profile: No results for input(s): INR, PROTIME in the last 168 hours. Cardiac Enzymes: No results for input(s): CKTOTAL, CKMB, CKMBINDEX, TROPONINI in the last 168 hours. BNP (last 3 results) No results for input(s): PROBNP in the last 8760 hours. HbA1C: No results for input(s): HGBA1C in the last 72 hours. CBG: Recent Labs  Lab 01/12/20 2019 01/13/20 0847 01/13/20 1146 01/13/20 1423 01/13/20 1613  GLUCAP 157* 101* 111* 108* 116*   Lipid Profile: No results for input(s): CHOL, HDL, LDLCALC, TRIG, CHOLHDL, LDLDIRECT in the last 72 hours. Thyroid Function Tests: No results for input(s): TSH, T4TOTAL, FREET4, T3FREE, THYROIDAB in the last 72 hours. Anemia Panel: No results for input(s): VITAMINB12, FOLATE, FERRITIN, TIBC, IRON, RETICCTPCT in the last 72 hours. Sepsis Labs: No results for input(s): PROCALCITON, LATICACIDVEN in the last 168 hours.  Recent Results (from the past 240 hour(s))  Blood Culture (routine x 2)     Status:  None   Collection Time: 01/05/20  6:30 PM   Specimen: BLOOD  Result Value Ref Range Status   Specimen Description BLOOD RIGHT ANTECUBITAL  Final   Special Requests   Final    BOTTLES DRAWN AEROBIC AND ANAEROBIC Blood Culture adequate volume   Culture   Final    NO GROWTH 5 DAYS Performed at [facility], [address] [place], [place] [address]    Report Status 01/10/2020 FINAL  Final  SARS Coronavirus 2 by RT PCR (hospital order, performed in [facility] hospital lab) Nasopharyngeal Nasopharyngeal Swab     Status: None   Collection Time: 01/05/20  6:30 PM   Specimen: Nasopharyngeal Swab  Result Value Ref Range Status   SARS Coronavirus 2 NEGATIVE NEGATIVE Final    Comment: (NOTE) SARS-CoV-2 target nucleic acids are NOT DETECTED.  The SARS-CoV-2 RNA is generally detectable in upper and lower respiratory specimens during the acute phase of infection. The lowest concentration of SARS-CoV-2 viral copies this assay can detect is 250 copies / mL. A negative result does not preclude SARS-CoV-2 infection and should not be used as the sole basis for treatment or other patient management decisions.  A negative result may occur with improper specimen collection / handling, submission of specimen other than nasopharyngeal swab, presence of viral mutation(s) within the areas targeted by this assay, and inadequate number of viral copies (<250 copies / mL). A negative result must be combined with clinical observations, patient history, and epidemiological information.  Fact Sheet for Patients:   StrictlyIdeas.no  Fact Sheet for Healthcare Providers: BankingDealers.co.za  This test is not yet approved or  cleared by the [place] FDA and has been authorized for detection and/or diagnosis of SARS-CoV-2 by FDA under an Emergency Use Authorization (EUA).  This EUA will remain in effect (meaning this test can be used) for the duration of  the COVID-19 declaration under Section 564(b)(1) of the Act, 21 U.S.C. section 360bbb-3(b)(1), unless the authorization is terminated or revoked sooner.  Performed at [facility], [place] [address] [place], [place] [address]   Blood Culture (routine x 2)     Status: None   Collection Time: 01/05/20  7:04 PM   Specimen: BLOOD LEFT HAND  Result Value Ref Range Status   Specimen Description BLOOD LEFT HAND  Final   Special Requests   Final    BOTTLES DRAWN AEROBIC AND ANAEROBIC Blood Culture results may not be optimal due to an inadequate volume of blood received in culture bottles   Culture   Final    NO GROWTH 5 DAYS Performed at [facility], [place] [address], [place], [place] [address]    Report Status 01/10/2020 FINAL  Final  MRSA PCR Screening     Status: None   Collection Time: 01/08/20  5:48 AM   Specimen: Nasal Mucosa; Nasopharyngeal  Result Value Ref Range Status   MRSA by PCR NEGATIVE NEGATIVE Final    Comment:        The GeneXpert MRSA Assay (FDA approved for NASAL specimens only), is one component of a comprehensive MRSA colonization surveillance program. It is not intended to diagnose MRSA infection nor to guide or monitor treatment for MRSA infections. Performed at [facility], [place] [address] [place], [place] [address]      Radiology Studies: No results found.  Scheduled Meds: . [MAR Hold] amoxicillin-clavulanate  1 tablet Oral q1800  . [MAR Hold] aspirin  162 mg Oral Daily  . [MAR Hold] Chlorhexidine Gluconate Cloth  6 each Topical Q0600  . [MAR Hold] cyanocobalamin  1,000 mcg Intramuscular Q30 days  . [MAR Hold] doxercalciferol  3 mcg Intravenous Q M,W,F-HD  . [MAR Hold] doxycycline  100 mg Oral Q12H  . [MAR Hold] gabapentin  300 mg Oral Daily  . [MAR Hold] heparin injection (subcutaneous)  5,000 Units Subcutaneous Q8H  . [MAR Hold] insulin aspart  0-6 Units Subcutaneous TID WC  . [MAR Hold] insulin NPH Human  10 Units Subcutaneous  BID AC  . [MAR Hold] labetalol  100 mg Oral BID  . [MAR Hold] levETIRAcetam  250 mg Oral BID  . [MAR Hold] multivitamin  1 tablet Oral QHS   Continuous Infusions: . sodium chloride 10 mL/hr at 01/13/20 1459  .  ceFAZolin (ANCEF) IV       LOS: 7 days   Marylu Lund, MD [facility] Pager On Amion  If 7PM-7AM, please contact night-coverage 01/13/2020, 4:34 PM

## 2020-01-13 NOTE — Anesthesia Preprocedure Evaluation (Signed)
Anesthesia Evaluation  Patient identified by MRN, date of birth, ID band Patient awake    Reviewed: Allergy & Precautions, NPO status , Patient's Chart, lab work & pertinent test results  Airway Mallampati: II  TM Distance: >3 FB Neck ROM: Full    Dental  (+) Dental Advisory Given   Pulmonary shortness of breath, pneumonia,    breath sounds clear to auscultation       Cardiovascular hypertension,  Rhythm:Regular Rate:Normal     Neuro/Psych  Neuromuscular disease CVA    GI/Hepatic Neg liver ROS, GERD  ,  Endo/Other  diabetes, Type 2  Renal/GU ESRF and DialysisRenal disease     Musculoskeletal   Abdominal   Peds  Hematology  (+) anemia ,   Anesthesia Other Findings   Reproductive/Obstetrics                             Anesthesia Physical Anesthesia Plan  ASA: III  Anesthesia Plan: MAC   Post-op Pain Management:    Induction:   PONV Risk Score and Plan: 2 and Propofol infusion, Ondansetron and Treatment may vary due to age or medical condition  Airway Management Planned: Natural Airway and Simple Face Mask  Additional Equipment:   Intra-op Plan:   Post-operative Plan:   Informed Consent: I have reviewed the patients History and Physical, chart, labs and discussed the procedure including the risks, benefits and alternatives for the proposed anesthesia with the patient or authorized representative who has indicated his/her understanding and acceptance.     Dental advisory given  Plan Discussed with: CRNA  Anesthesia Plan Comments:         Anesthesia Quick Evaluation

## 2020-01-13 NOTE — Op Note (Signed)
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  SURGEON:  Newt Minion, MD  PHYSICIAN ASSISTANT:None ANESTHESIA:   General  PREOPERATIVE INDICATIONS:  [name] is a  79 y.o. female with a diagnosis of osteomyelitis who failed conservative measures and elected for surgical management.    The risks benefits and alternatives were discussed with the patient preoperatively including but not limited to the risks of infection, bleeding, nerve injury, cardiopulmonary complications, the need for revision surgery, among others, and the patient was willing to proceed.  OPERATIVE IMPLANTS: None  _0 @  OPERATIVE FINDINGS: Ischemic soft tissue changes with ankle-brachial indices showing good circulation to the ankle.  OPERATIVE PROCEDURE: Patient was brought the operating room and underwent a MAC anesthetic.  The right lower extremity was then prepped using DuraPrep draped into a sterile field patient underwent a local block with 20 cc of 1% lidocaine plain after a timeout was called.  A racquet incision was made around the ulcerative tissue this extended back to the base of the first metatarsal.  The first ray was resected through the base of the first metatarsal.  There was bleeding from the dorsalis pedis artery.  Electrocautery was used for hemostasis the wound was irrigated with normal saline there is no abscess or necrotic tissue along the surgical margins.  This left a wound that was 3 x 9 cm.  Local tissue rearrangement was used to close the wound.  A sterile dressing was applied patient was taken to the PACU in stable condition.   DISCHARGE PLANNING:  Antibiotic duration: Continue antibiotics for 24 hours  Weightbearing: Touchdown weightbearing  on the right  Pain medication: Opioid pathway  Dressing care/ Wound VAC: Dry dressing change as needed  Ambulatory devices: Walker  Discharge to: Discharge planning based on therapy recommendations.  Follow-up: In the office 1 week post operative.

## 2020-01-13 NOTE — Progress Notes (Signed)
[facility] Progress Note    Assessment/ Plan:   1. ESRD on hemodialysis  1. Monday Wednesday Friday ([place]). RUE AVF  Outpatient orders: 4 hours, revaclear300, BFR 400/dfr 600. 3k, 2.5cal, 138na, 35bicarb. No heparin.  2. HD on MWF schedule: 3K, 4h, 2-3L UF, no heparin, AVF 2. Anemia of chronic kidney disease  hgb stable  receives epogen 4k units outpatient 3. Secondary hyperparathyroidism; phos ok, recevies hectorol 3.19mcg w/ treatments 4. Sepsis secondary to cellulitis of right foot associate with diabetic ulcer 1. Osteomyelitis ruled out 2. Now no augmentin/doxy 3. Purulence returned 7/29, ortho to debride 5. HTN/volume status, bp at [facility] 1. Probe down post HD weights as able, BP ok   [facility]  Subjective:   Developed purulent drainage from foot For ray amputation with ortho today   Objective:   BP (!) 153/65 (BP Location: Left Arm)    Pulse 71    Temp 97.6 F (36.4 C)    Resp 16    Ht 5\' 8"  (1.727 m)    Wt 85.6 kg    SpO2 100%    BMI 28.69 kg/m   Intake/Output Summary (Last 24 hours) at 01/13/2020 1202 Last data filed at 01/13/2020 1540 Gross per 24 hour  Intake 720 ml  Output --  Net 720 ml   Weight change:   Physical Exam: Gen: NAD in bed, pleasant CVS: Regular rate, S1-S2 Resp: Clear to auscultation bilaterally, normal work of breathing, no wheezes/rhonchi/rales/crackles, bilateral chest expansion Abd: Soft, nontender/nondistended Ext: Right upper extremity AVF with good bruit and thrill, right foot bandaged Neuro: No focal deficits, aaox3   Imaging: No results found.  Labs: BMET Recent Labs  Lab 01/06/20 1827 01/07/20 0752 01/08/20 0201 01/09/20 0430 01/10/20 0352 01/11/20 0428  NA 136 135 133* 133* 134* 132*  K 4.1 4.3 4.5 5.2* 3.4* 3.9  CL 94* 96* 94* 95* 95* 92*  CO2 27 28 26  21* 28 24  GLUCOSE 124* 198* 187* 149* 137* 226*  BUN 17 28* 43* 61* 25* 41*  CREATININE 4.53* 5.77*  7.33* 9.42* 5.44* 7.32*  CALCIUM 8.9 8.3* 8.2* 8.0* 8.1* 8.0*  PHOS 3.2  --   --   --   --   --    CBC Recent Labs  Lab 01/07/20 0752 01/07/20 0752 01/08/20 0201 01/08/20 0201 01/09/20 0430 01/09/20 1450 01/10/20 0352 01/11/20 1400  WBC 15.4*   < > 12.4*   < > 11.3* 11.2* 9.8 11.0*  NEUTROABS 12.4*  --  9.3*  --  8.6*  --  6.9  --   HGB 11.1*   < > 11.3*   < > 10.9* 10.4* 11.8* 10.7*  HCT 32.8*   < > 33.0*   < > 31.6* 30.3* 34.1* 31.0*  MCV 90.6   < > 91.7   < > 90.5 89.4 90.7 90.1  PLT 98*   < > 113*   < > 145* 132* 129* 168   < > = values in this interval not displayed.    Medications:     amoxicillin-clavulanate  1 tablet Oral q1800   aspirin  162 mg Oral Daily   Chlorhexidine Gluconate Cloth  6 each Topical Q0600   cyanocobalamin  1,000 mcg Intramuscular Q30 days   doxercalciferol  3 mcg Intravenous Q M,W,F-HD   doxycycline  100 mg Oral Q12H   gabapentin  300 mg Oral Daily   heparin injection (subcutaneous)  5,000 Units Subcutaneous Q8H  insulin aspart  0-6 Units Subcutaneous TID WC   insulin NPH Human  10 Units Subcutaneous BID AC   labetalol  100 mg Oral BID   levETIRAcetam  250 mg Oral BID   multivitamin  1 tablet Oral QHS

## 2020-01-13 NOTE — Transfer of Care (Signed)
Immediate Anesthesia Transfer of Care Note  Patient: [name]  Procedure(s) Performed: AMPUTATION RIGHT FOOT 1ST RAY (Right )  Patient Location: PACU  Anesthesia Type:MAC  Level of Consciousness: awake, alert  and oriented  Airway & Oxygen Therapy: Patient Spontanous Breathing  Post-op Assessment: Report given to RN and Post -op Vital signs reviewed and stable  Post vital signs: Reviewed and stable  Last Vitals:  Vitals Value Taken Time  BP 145/63 01/13/20 1614  Temp 36.4 C 01/13/20 1615  Pulse 76 01/13/20 1617  Resp 16 01/13/20 1617  SpO2 100 % 01/13/20 1617  Vitals shown include unvalidated device data.  Last Pain:  Vitals:   01/13/20 1615  TempSrc:   PainSc: 0-No pain      Patients Stated Pain Goal: 1 (60/47/99 8721)  Complications: No complications documented.

## 2020-01-13 NOTE — Anesthesia Postprocedure Evaluation (Signed)
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  Last Vitals:  Vitals:   01/13/20 1700 01/13/20 1741  BP: (!) 141/62 (!) 151/65  Pulse: 75 73  Resp: 16 20  Temp: 36.5 C 37 C  SpO2: 97% 98%    Last Pain:  Vitals:   01/13/20 1741  TempSrc: Oral  PainSc:                  Tiajuana Amass

## 2020-01-13 NOTE — Interval H&P Note (Signed)
History and Physical Interval Note:  01/13/2020 6:51 AM  [name]  has presented today for surgery, with the diagnosis of osteomyelitis.  The various methods of treatment have been discussed with the patient and family. After consideration of risks, benefits and other options for treatment, the patient has consented to  Procedure(s): AMPUTATION RIGHT FOOT 1ST RAY (Right) as a surgical intervention.  The patient's history has been reviewed, patient examined, no change in status, stable for surgery.  I have reviewed the patient's chart and labs.  Questions were answered to the patient's satisfaction.     [name]

## 2020-01-14 ENCOUNTER — Encounter (HOSPITAL_COMMUNITY): Payer: Self-pay | Admitting: Orthopedic Surgery

## 2020-01-14 DIAGNOSIS — N186 End stage renal disease: Secondary | ICD-10-CM | POA: Diagnosis not present

## 2020-01-14 DIAGNOSIS — L03115 Cellulitis of right lower limb: Secondary | ICD-10-CM | POA: Diagnosis not present

## 2020-01-14 DIAGNOSIS — Z992 Dependence on renal dialysis: Secondary | ICD-10-CM | POA: Diagnosis not present

## 2020-01-14 DIAGNOSIS — I96 Gangrene, not elsewhere classified: Secondary | ICD-10-CM | POA: Diagnosis not present

## 2020-01-14 LAB — GLUCOSE, CAPILLARY
Glucose-Capillary: 105 mg/dL — ABNORMAL HIGH (ref 70–99)
Glucose-Capillary: 115 mg/dL — ABNORMAL HIGH (ref 70–99)
Glucose-Capillary: 130 mg/dL — ABNORMAL HIGH (ref 70–99)
Glucose-Capillary: 150 mg/dL — ABNORMAL HIGH (ref 70–99)
Glucose-Capillary: 175 mg/dL — ABNORMAL HIGH (ref 70–99)

## 2020-01-14 NOTE — Progress Notes (Signed)
PT Cancellation Note  Patient Details Name: [name] MRN: [id number] DOB: 01/23/1941   Cancelled Treatment:    Reason Eval/Treat Not Completed: Pain limiting ability to participate Therapy session deferred as pt in significant amount of pain at rest; pt daughter playing gospel music at bedside to provide distraction. Noted per chart review, currently minimizing opioid medications due to confusion/acute metabolic encephalopathy. Provided pt with RLE elevation and ice pack. Ordered post op shoe for attempt at mobilization tomorrow.    Wyona Almas, PT, DPT Acute Rehabilitation Services Pager [phone] Office [phone]    Deno Etienne 01/14/2020, 4:19 PM

## 2020-01-14 NOTE — Progress Notes (Signed)
OT Cancellation Note  Patient Details Name: [name] MRN: [id number] DOB: 1941/05/23   Cancelled Treatment:    Reason Eval/Treat Not Completed: Other (comment) Per NT, pt nauseous at this time. OT will hold therapy and return as time allows and pt is appropriate.   [facility] OTR/L Acute Rehabilitation Services Office: [place] 01/14/2020, 2:31 PM

## 2020-01-14 NOTE — Progress Notes (Signed)
PROGRESS NOTE    [name]  IFO:[id number] DOB: February 23, 1941 DOA: 01/05/2020 PCP: [name], MD    Brief Narrative:  79 y.o.femalewith medical history significant forESRD on HD MWF, IDT2DM, history of CVA, seizure disorder not taking AEDs, HTN, who presents to [facility] ED for evaluation of right foot cellulitis. Follows with her podiatrist regularly for management of an ulcer on the plantar surface of the right foot at the base of the first MTP. She had been wearing a boot for several weeks and noticed some swelling in her right leg since then. She went to see her podiatrist on 01/05/2020 who felt she was developing cellulitis of the right foot. She was subsequently sent to the ED for further evaluation and management.  In the ED, Temp 99.9 Fahrenheit, WBC 23.6K, hemoglobin 12.4K, platelets 83,000 (171,000 previously on 05/15/2019), lactic acid 1.4, sodium 136, potassium 4.6, bicarb 27, BUN 26, creatinine 6.66.  SARS-CoV-2 PCR negative. Blood cultures x2 negative to date.  Right foot x-ray shows soft tissue swelling with ulcer/wound along plantar aspect of the foot at the level of the first MTP. No definite acute osseous abnormality seen.  MRI right foot negative for osteomyelitis.  Poor dorsalis pedis pulses.  [place] ABI, B/L + PAD.  Daughter in the room requests referral to a different podiatrist.  TOC consulted to assist with this request.  Hospital course complicated by possible seizure activity, the morning of 01/09/2020, for which an EEG was done and neurology was consulted.  Discussed with neurology, Dr, Hortense Ramal.  She recommended continue home dose Keppra 250 mg twice daily and follow-up with neurology outpatient if MRI unremarkable for any acute intracranial findings.  No reported recurrence of seizure-like activities.  Assessment & Plan:   Principal Problem:   Cellulitis of right foot Active Problems:   Diabetes mellitus, type 2 (HCC)   Leukocytosis   Hypertension  associated with diabetes ([place])   ESRD (end stage renal disease) on dialysis ([place])   History of CVA (cerebrovascular accident)   Thrombocytopenia ([place])   Diabetic ulcer of right foot associated with diabetes mellitus due to underlying condition ([place])   Gangrene of toe of right foot ([place])   Sepsis, improving, secondary to cellulitis of right foot associated with diabetic ulcer, osteomyelitis ruled out. Presented with leukocytosis 20K, tachycardia, tachypnea, cellulitis distal dorsal right foot on Xray and MRI. Has plantar diabetic ulcer at base of first MTP without obvious drainage.  ESR 42  and CRP 20 No evidence of DVT on bilateral Doppler ultrasound No evidence of osteomyelitis on MRI right foot. Leukocytosis has improved Initially received Rocephin, then switched to IV Zosyn and IV vancomycin Started Augmentin on 01/10/2020 On 7/29, noted to have purulent drainage around foot wound Consulted orthopedic surgery, appreciate input.  Patient now status post first ray amputation PT/OT consulted  Seizure disorder Generalized convulsions 7/27 AM.  Per her daughter ceased to take her AED Keppra about 1 year ago, did not return to neuro for refills.  EEG ordered, no epileptiform activity MRI brain ordered,  findings of fairly extensive post ischemic gliosis in the superior frontal and parietal lobes, much of which may be the sequela of bilateral infarct seen in 2015 Continue Keppra to 50 mg twice daily as recommended by neurology  Dr. [place] Crane discussed with neurology, Dr, Hortense Ramal.  She recommended continue home dose Keppra 250 mg twice daily and follow-up with neurology outpatient if MRI unremarkable for any acute intracranial findings.  We will then discharge from a neurological standpoint.  No reported recurrence of seizure activities.  Continue seizure precautions. EEG recently reviewed, no evidence of seizures.  Patient is to f/u with neuro as outpt  Peripheral arterial disease seen on  bilateral lower extremity vascular ultrasound ABI done on 01/08/20, showing mild right lower extremity arterial disease, moderate left lower extremity arterial disease. Continue aspirin, 162 mg daily Possibly not on statin due to ESRD. Will need to follow-up with a cardiologist when discharged  History of CVA: Continue home aspirin 162 mg daily, monitor platelet counts as above. Not currently on statin therapy possibly due to ESRD.  Acute hyperkalemia, resolved with hemodialysis, in the setting of ESRD Serum potassium 5.8> 4.5> 5.2> 3.4 Electrolytes addressed by hemodialysis. Continue with dialysis as tolerated  Mild hypovolemic hyponatremia Volume status managed with hemodialysis Sodium had improved since undergoing dialysis  Bilateral lower extremity edema, resolved post hemodialysis, secondary to volume overload on exam Presented with 1+ pitting edema in lower extremities bilaterally, has improved after hemodialysis. No evidence of DVT on bilateral Doppler ultrasound Advised to elevate her legs when at rest  ESRD on HD MWF: Seen by Dr. Candiss Norse, appreciate nephrology's assistance. Continue hemodialysis as tolerated  Thrombocytopenia secondary to acute illness, improving: No evidence of bleeding Plt now normalized  Insulin-dependent type 2 diabetes, with hypoglycemia : A1C 7.2 on 01/05/20 Hypoglycemic AM of 7/26 Now on 2 units twice daily Lantus Cont to titrate insulin with goal of euglycemia  Hypertension: Blood pressure is at goal Continue home labetalol.  Hydralazine as needed. Continue to monitor blood pressure  Vitamin B12 deficiency Continue monthly B12 IM injection after hemodialysis 01/09/2020.  Acute toxic metabolic encephalopathy Patient alert oriented fully conversant this morning. Later in the day, patient noted to be much more lethargic.  Chart reviewed, patient did receive multiple doses of opioid postoperatively Suspect polypharmacy related to  opioid, recommend minimizing sedating medications   DVT prophylaxis: Heparin subq Code Status: Full Family Communication: Pt in room, family currently not at bedside  Status is: Inpatient  Remains inpatient appropriate because:Unsafe d/c plan   Dispo:  Patient From: Home  Planned Disposition: Home with Health Care Svc  Expected discharge date: 01/11/20  Medically stable for discharge: No   Consultants:   Nephrology  Orthopedic Surgery  Procedures:   Right first ray amputation 01/13/2020  Antimicrobials: Anti-infectives (From admission, onward)   Start     Dose/Rate Route Frequency Ordered Stop   01/13/20 1445  ceFAZolin (ANCEF) IVPB 2g/100 mL premix  Status:  Discontinued        2 g 200 mL/hr over 30 Minutes Intravenous On call to O.R. 01/13/20 1440 01/13/20 1716   01/10/20 1800  amoxicillin-clavulanate (AUGMENTIN) 500-125 MG per tablet 500 mg     Discontinue     1 tablet Oral Daily-1800 01/09/20 1032     01/10/20 1000  doxycycline (VIBRA-TABS) tablet 100 mg     Discontinue     100 mg Oral Every 12 hours 01/09/20 1032     01/09/20 1200  vancomycin (VANCOCIN) IVPB 750 mg/150 ml premix        750 mg 150 mL/hr over 60 Minutes Intravenous Every M-W-F (Hemodialysis) 01/08/20 0557 01/09/20 1802   01/08/20 1400  piperacillin-tazobactam (ZOSYN) IVPB 2.25 g        2.25 g 100 mL/hr over 30 Minutes Intravenous Every 8 hours 01/08/20 0557 01/09/20 2359   01/08/20 0600  piperacillin-tazobactam (ZOSYN) IVPB 3.375 g        3.375 g 100 mL/hr over 30 Minutes Intravenous  Once 01/08/20 0550 01/08/20 0654   01/08/20 0600  vancomycin (VANCOCIN) IVPB 1000 mg/200 mL premix  Status:  Discontinued        1,000 mg 200 mL/hr over 60 Minutes Intravenous  Once 01/08/20 0550 01/08/20 0555   01/08/20 0600  vancomycin (VANCOREADY) IVPB 1750 mg/350 mL        1,750 mg 175 mL/hr over 120 Minutes Intravenous  Once 01/08/20 0557 01/08/20 1044   01/06/20 0800  cefTRIAXone (ROCEPHIN) 1 g in sodium  chloride 0.9 % 100 mL IVPB  Status:  Discontinued        1 g 200 mL/hr over 30 Minutes Intravenous Every 24 hours 01/05/20 1906 01/06/20 1047   01/05/20 1800  cefTRIAXone (ROCEPHIN) 2 g in sodium chloride 0.9 % 100 mL IVPB  Status:  Discontinued        2 g 200 mL/hr over 30 Minutes Intravenous Every 24 hours 01/05/20 1752 01/05/20 1906      Subjective: This morning, only complaining of mild residual postoperative pain in right foot  Objective: Vitals:   01/14/20 0025 01/14/20 0426 01/14/20 1044 01/14/20 1429  BP: (!) 141/66 (!) 122/54 (!) 141/60 (!) 149/68  Pulse: 76 87 82 76  Resp: 18 18 18    Temp: 98.3 F (36.8 C) 99.2 F (37.3 C) 98.1 F (36.7 C) 99 F (37.2 C)  TempSrc: Oral Oral Oral Oral  SpO2: 99% 92% 99% 92%  Weight:      Height:        Intake/Output Summary (Last 24 hours) at 01/14/2020 1446 Last data filed at 01/14/2020 9628 Gross per 24 hour  Intake 635 ml  Output --  Net 635 ml   Filed Weights   01/11/20 1415 01/11/20 1819 01/12/20 0504  Weight: 86.3 kg 83.3 kg 85.6 kg    Examination: General exam: Awake, laying in bed, in nad Respiratory system: Normal respiratory effort, no wheezing Cardiovascular system: regular rate, s1, s2 Gastrointestinal system: Soft, nondistended, positive BS Central nervous system: CN2-12 grossly intact, strength intact Extremities: Perfused, no clubbing, dressings in place over R foot Skin: Normal skin turgor, no notable skin lesions seen Psychiatry: Mood normal // no visual hallucinations   Data Reviewed: I have personally reviewed following labs and imaging studies  CBC: Recent Labs  Lab 01/08/20 0201 01/08/20 0201 01/09/20 0430 01/09/20 1450 01/10/20 0352 01/11/20 1400 01/13/20 1531  WBC 12.4*  --  11.3* 11.2* 9.8 11.0*  --   NEUTROABS 9.3*  --  8.6*  --  6.9  --   --   HGB 11.3*   < > 10.9* 10.4* 11.8* 10.7* 13.9  HCT 33.0*   < > 31.6* 30.3* 34.1* 31.0* 41.0  MCV 91.7  --  90.5 89.4 90.7 90.1  --   PLT 113*   --  145* 132* 129* 168  --    < > = values in this interval not displayed.   Basic Metabolic Panel: Recent Labs  Lab 01/08/20 0201 01/09/20 0430 01/10/20 0352 01/11/20 0428 01/13/20 1531  NA 133* 133* 134* 132* 136  K 4.5 5.2* 3.4* 3.9 4.4  CL 94* 95* 95* 92* 96*  CO2 26 21* 28 24  --   GLUCOSE 187* 149* 137* 226* 105*  BUN 43* 61* 25* 41* 40*  CREATININE 7.33* 9.42* 5.44* 7.32* 8.70*  CALCIUM 8.2* 8.0* 8.1* 8.0*  --    GFR: Estimated Creatinine Clearance: 5.9 mL/min (A) (by C-G formula based on SCr of 8.7 mg/dL (H)). Liver Function Tests: No  results for input(s): AST, ALT, ALKPHOS, BILITOT, PROT, ALBUMIN in the last 168 hours. No results for input(s): LIPASE, AMYLASE in the last 168 hours. No results for input(s): AMMONIA in the last 168 hours. Coagulation Profile: No results for input(s): INR, PROTIME in the last 168 hours. Cardiac Enzymes: No results for input(s): CKTOTAL, CKMB, CKMBINDEX, TROPONINI in the last 168 hours. BNP (last 3 results) No results for input(s): PROBNP in the last 8760 hours. HbA1C: No results for input(s): HGBA1C in the last 72 hours. CBG: Recent Labs  Lab 01/13/20 1613 01/13/20 1734 01/14/20 0035 01/14/20 0756 01/14/20 1213  GLUCAP 116* 104* 105* 150* 175*   Lipid Profile: No results for input(s): CHOL, HDL, LDLCALC, TRIG, CHOLHDL, LDLDIRECT in the last 72 hours. Thyroid Function Tests: No results for input(s): TSH, T4TOTAL, FREET4, T3FREE, THYROIDAB in the last 72 hours. Anemia Panel: No results for input(s): VITAMINB12, FOLATE, FERRITIN, TIBC, IRON, RETICCTPCT in the last 72 hours. Sepsis Labs: No results for input(s): PROCALCITON, LATICACIDVEN in the last 168 hours.  Recent Results (from the past 240 hour(s))  Blood Culture (routine x 2)     Status: None   Collection Time: 01/05/20  6:30 PM   Specimen: BLOOD  Result Value Ref Range Status   Specimen Description BLOOD RIGHT ANTECUBITAL  Final   Special Requests   Final     BOTTLES DRAWN AEROBIC AND ANAEROBIC Blood Culture adequate volume   Culture   Final    NO GROWTH 5 DAYS Performed at [facility], [address], [place], [place] [address]    Report Status 01/10/2020 FINAL  Final  SARS Coronavirus 2 by RT PCR (hospital order, performed in [facility] hospital lab) Nasopharyngeal Nasopharyngeal Swab     Status: None   Collection Time: 01/05/20  6:30 PM   Specimen: Nasopharyngeal Swab  Result Value Ref Range Status   SARS Coronavirus 2 NEGATIVE NEGATIVE Final    Comment: (NOTE) SARS-CoV-2 target nucleic acids are NOT DETECTED.  The SARS-CoV-2 RNA is generally detectable in upper and lower respiratory specimens during the acute phase of infection. The lowest concentration of SARS-CoV-2 viral copies this assay can detect is 250 copies / mL. A negative result does not preclude SARS-CoV-2 infection and should not be used as the sole basis for treatment or other patient management decisions.  A negative result may occur with improper specimen collection / handling, submission of specimen other than nasopharyngeal swab, presence of viral mutation(s) within the areas targeted by this assay, and inadequate number of viral copies (<250 copies / mL). A negative result must be combined with clinical observations, patient history, and epidemiological information.  Fact Sheet for Patients:   StrictlyIdeas.no  Fact Sheet for Healthcare Providers: BankingDealers.co.za  This test is not yet approved or  cleared by the [place] FDA and has been authorized for detection and/or diagnosis of SARS-CoV-2 by FDA under an Emergency Use Authorization (EUA).  This EUA will remain in effect (meaning this test can be used) for the duration of the COVID-19 declaration under Section 564(b)(1) of the Act, 21 U.S.C. section 360bbb-3(b)(1), unless the authorization is terminated or revoked sooner.  Performed at [facility], [place] [address], [place], [place] [address]   Blood Culture (routine x 2)     Status: None   Collection Time: 01/05/20  7:04 PM   Specimen: BLOOD LEFT HAND  Result Value Ref Range Status   Specimen Description BLOOD LEFT HAND  Final   Special  Requests   Final    BOTTLES DRAWN AEROBIC AND ANAEROBIC Blood Culture results may not be optimal due to an inadequate volume of blood received in culture bottles   Culture   Final    NO GROWTH 5 DAYS Performed at [facility], [place] [address] [place], [place] [address]    Report Status 01/10/2020 FINAL  Final  MRSA PCR Screening     Status: None   Collection Time: 01/08/20  5:48 AM   Specimen: Nasal Mucosa; Nasopharyngeal  Result Value Ref Range Status   MRSA by PCR NEGATIVE NEGATIVE Final    Comment:        The GeneXpert MRSA Assay (FDA approved for NASAL specimens only), is one component of a comprehensive MRSA colonization surveillance program. It is not intended to diagnose MRSA infection nor to guide or monitor treatment for MRSA infections. Performed at [facility], [place] [address], [place], [place] [address]      Radiology Studies: No results found.  Scheduled Meds: . amoxicillin-clavulanate  1 tablet Oral q1800  . aspirin  162 mg Oral Daily  . Chlorhexidine Gluconate Cloth  6 each Topical Q0600  . cyanocobalamin  1,000 mcg Intramuscular Q30 days  . docusate sodium  100 mg Oral BID  . doxercalciferol  3 mcg Intravenous Q M,W,F-HD  . doxycycline  100 mg Oral Q12H  . gabapentin  300 mg Oral Daily  . heparin injection (subcutaneous)  5,000 Units Subcutaneous Q8H  . insulin aspart  0-6 Units Subcutaneous TID WC  . insulin NPH Human  10 Units Subcutaneous BID AC  . labetalol  100 mg Oral BID  . levETIRAcetam  250 mg Oral BID  . multivitamin  1 tablet Oral QHS   Continuous Infusions: . sodium chloride 75 mL/hr at 01/13/20 1728     LOS: 8 days   Marylu Lund, MD [facility] Pager On  Amion  If 7PM-7AM, please contact night-coverage 01/14/2020, 2:46 PM

## 2020-01-14 NOTE — Progress Notes (Signed)
Orthopedic Tech Progress Note Patient Details:  [name] 03/15/1941 [id number] Left post op shoe bedside for patient. Ortho Devices Type of Ortho Device: Postop shoe/boot Ortho Device/Splint Location: LLE Ortho Device/Splint Interventions: Ordered   Post Interventions Patient Tolerated: Well Instructions Provided: Care of device   Rowynn Mcweeney A Lura Falor 01/14/2020, 4:35 PM

## 2020-01-14 NOTE — Progress Notes (Signed)
Patient ID: [name], female   DOB: 1941-04-29, 79 y.o.   MRN: [id number] Patient is postoperative day 1 right foot first ray amputation.  The dressing is bloody I will have the dressing changed.  Patient should minimize her weightbearing on the right patient did have poor microcirculation and this will be the rate limiting step for wound healing.

## 2020-01-14 NOTE — Progress Notes (Signed)
[facility] Progress Note    Assessment/ Plan:   1. ESRD on hemodialysis  1. Monday Wednesday Friday ([place]). RUE AVF  Outpatient orders: 4 hours, revaclear300, BFR 400/dfr 600. 3k, 2.5cal, 138na, 35bicarb. No heparin.  2. HD on MWF schedule: 3K, 4h, 2-3L UF, no heparin, AVF 2. Anemia of chronic kidney disease  hgb stable  receives epogen 4k units outpatient 3. Secondary hyperparathyroidism; phos ok, recevies hectorol 3.24mcg w/ treatments 4. 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 Now no augmentin/doxy 5. HTN/volume status, bp at [facility] 1. Probe down post HD weights as able, BP ok   [facility]  Subjective:   Amputation of first ray on right foot yesterday Not quite as sharp this morning HD overnight, UF not recorded   Objective:   BP (!) 141/60 (BP Location: Left Arm)   Pulse 82   Temp 98.1 F (36.7 C) (Oral)   Resp 18   Ht 5\' 8"  (1.727 m)   Wt 85.6 kg   SpO2 99%   BMI 28.69 kg/m   Intake/Output Summary (Last 24 hours) at 01/14/2020 1118 Last data filed at 01/14/2020 7619 Gross per 24 hour  Intake 635 ml  Output --  Net 635 ml   Weight change:   Physical Exam: Gen: NAD in bed, pleasant but more groggy this morning CVS: Regular rate, S1-S2 Resp: Clear to auscultation bilaterally, normal work of breathing, no wheezes/rhonchi/rales/crackles, bilateral chest expansion Abd: Soft, nontender/nondistended Ext: Right upper extremity AVF with good bruit and thrill, right foot bandaged with significant blood on dressings Neuro: No focal deficits, aaox3   Imaging: No results found.  Labs: BMET Recent Labs  Lab 01/08/20 0201 01/09/20 0430 01/10/20 0352 01/11/20 0428 01/13/20 1531  NA 133* 133* 134* 132* 136  K 4.5 5.2* 3.4* 3.9 4.4  CL 94* 95* 95* 92* 96*  CO2 26 21* 28 24  --   GLUCOSE 187* 149* 137* 226* 105*  BUN 43* 61*  25* 41* 40*  CREATININE 7.33* 9.42* 5.44* 7.32* 8.70*  CALCIUM 8.2* 8.0* 8.1* 8.0*  --    CBC Recent Labs  Lab 01/08/20 0201 01/08/20 0201 01/09/20 0430 01/09/20 0430 01/09/20 1450 01/10/20 0352 01/11/20 1400 01/13/20 1531  WBC 12.4*   < > 11.3*  --  11.2* 9.8 11.0*  --   NEUTROABS 9.3*  --  8.6*  --   --  6.9  --   --   HGB 11.3*   < > 10.9*   < > 10.4* 11.8* 10.7* 13.9  HCT 33.0*   < > 31.6*   < > 30.3* 34.1* 31.0* 41.0  MCV 91.7   < > 90.5  --  89.4 90.7 90.1  --   PLT 113*   < > 145*  --  132* 129* 168  --    < > = values in this interval not displayed.    Medications:    . amoxicillin-clavulanate  1 tablet Oral q1800  . aspirin  162 mg Oral Daily  . Chlorhexidine Gluconate Cloth  6 each Topical Q0600  . cyanocobalamin  1,000 mcg Intramuscular Q30 days  . docusate sodium  100 mg Oral BID  . doxercalciferol  3 mcg Intravenous Q M,W,F-HD  . doxycycline  100 mg Oral Q12H  . gabapentin  300 mg Oral Daily  . heparin injection (subcutaneous)  5,000 Units Subcutaneous Q8H  .  insulin aspart  0-6 Units Subcutaneous TID WC  . insulin NPH Human  10 Units Subcutaneous BID AC  . labetalol  100 mg Oral BID  . levETIRAcetam  250 mg Oral BID  . multivitamin  1 tablet Oral QHS

## 2020-01-15 DIAGNOSIS — I96 Gangrene, not elsewhere classified: Secondary | ICD-10-CM | POA: Diagnosis not present

## 2020-01-15 DIAGNOSIS — Z992 Dependence on renal dialysis: Secondary | ICD-10-CM | POA: Diagnosis not present

## 2020-01-15 DIAGNOSIS — N186 End stage renal disease: Secondary | ICD-10-CM | POA: Diagnosis not present

## 2020-01-15 DIAGNOSIS — L03115 Cellulitis of right lower limb: Secondary | ICD-10-CM | POA: Diagnosis not present

## 2020-01-15 LAB — GLUCOSE, CAPILLARY
Glucose-Capillary: 117 mg/dL — ABNORMAL HIGH (ref 70–99)
Glucose-Capillary: 184 mg/dL — ABNORMAL HIGH (ref 70–99)
Glucose-Capillary: 207 mg/dL — ABNORMAL HIGH (ref 70–99)
Glucose-Capillary: 155 mg/dL — ABNORMAL HIGH (ref 70–99)

## 2020-01-15 MED ORDER — MORPHINE SULFATE (PF) 2 MG/ML IV SOLN: 1.0000 mg | Freq: Once | INTRAVENOUS | Status: DC

## 2020-01-15 NOTE — Progress Notes (Addendum)
Physical Therapy Treatment Patient Details Name: [name] MRN: [id number] DOB: 08-Jan-1941 Today's Date: 01/15/2020    History of Present Illness [name] is a 79 y.o. female with PMH of ESRD on HD MWF, IDT2DM, history of CVA, HTN who wasadmitted on 01/05/2020 for right foot cellulitis and has been on Vanc/Zosyn. On 7/26  patient was noted to have generalized shaking concerning for seizure x2, EEG WNL. 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      PT Comments    Pt received sitting on edge, with increased sanguineous drainage from surgical site draining onto floor. Returned to supine and provided elevation. RN notified and assisted her with providing second reinforcement dressing (MD notified). Worked with patient on bed level open chain exercises for BUE/BLE strengthening and ROM. Educated pt regarding new weightbearing precautions (although she remains confused). Transfers out of bed deferred today due to reasons listed above. Will continue to progress mobility as tolerated.    Follow Up Recommendations  SNF     Equipment Recommendations  Wheelchair (measurements PT);Wheelchair cushion (measurements PT)    Recommendations for Other Services       Precautions / Restrictions Precautions Precautions: Fall Required Braces or Orthoses: Other Brace Other Brace: post op shoe Restrictions Weight Bearing Restrictions: Yes RLE Weight Bearing: Touchdown weight bearing    Mobility  Bed Mobility Overal bed mobility: Needs Assistance Bed Mobility: Sit to Supine     Supine to sit: Supervision        Transfers                 General transfer comment: deferred   Ambulation/Gait                 Stairs             Wheelchair Mobility    Modified Rankin (Stroke Patients Only)       Balance Overall balance assessment: Needs assistance Sitting-balance support: Feet unsupported Sitting balance-Leahy Scale: Good                                       Cognition Arousal/Alertness: Awake/alert Behavior During Therapy: WFL for tasks assessed/performed Overall Cognitive Status: Impaired/Different from baseline Area of Impairment: Orientation;Attention;Memory;Awareness                 Orientation Level: Disoriented to;Time Current Attention Level: Sustained Memory: Decreased short-term memory;Decreased recall of precautions     Awareness: Intellectual   General Comments: Pt remains acutely confused, asking why she didn't have her foot dressed for dialysis before coming into hospital. Not oriented to time, stating year was 1920. Able to correctly state month after increased period of time and place. Follows 1 step commands, needs cues for sustaining attention.      Exercises General Exercises - Upper Extremity Shoulder Flexion: Both;10 reps;Supine Elbow Flexion: Both;10 reps;Supine General Exercises - Lower Extremity Heel Slides: Both;10 reps;Supine Straight Leg Raises: Both;10 reps;Supine    General Comments        Pertinent Vitals/Pain Pain Assessment: Faces Faces Pain Scale: Hurts whole lot Pain Location: surgical site Pain Descriptors / Indicators: Sharp;Grimacing;Guarding;Other (Comment) (shouting post session) Pain Intervention(s): Limited activity within patient's tolerance;Monitored during session;Repositioned;Premedicated before session    Home Living                      Prior Function  PT Goals (current goals can now be found in the care plan section) Acute Rehab PT Goals Patient Stated Goal: less pain PT Goal Formulation: With patient Time For Goal Achievement: 01/29/20 Potential to Achieve Goals: Fair Progress towards PT goals: Not progressing toward goals - comment (limited by pain)    Frequency    Min 3X/week      PT Plan Current plan remains appropriate    Co-evaluation              AM-PAC PT "6 Clicks" Mobility   Outcome Measure  Help  needed turning from your back to your side while in a flat bed without using bedrails?: None Help needed moving from lying on your back to sitting on the side of a flat bed without using bedrails?: None Help needed moving to and from a bed to a chair (including a wheelchair)?: A Little Help needed standing up from a chair using your arms (e.g., wheelchair or bedside chair)?: A Little Help needed to walk in hospital room?: A Lot Help needed climbing 3-5 steps with a railing? : Total 6 Click Score: 17    End of Session   Activity Tolerance: Patient limited by pain;Other (comment) (limited due to increased surgical site drainage) Patient left: in bed;with call bell/phone within reach;with bed alarm set Nurse Communication: Mobility status;Other (comment) (drainage) PT Visit Diagnosis: Muscle weakness (generalized) (M62.81);Pain;Difficulty in walking, not elsewhere classified (R26.2) Pain - Right/Left: Right Pain - part of body: Ankle and joints of foot     Time: 4696-2952 PT Time Calculation (min) (ACUTE ONLY): 24 min  Charges:  $Therapeutic Exercise: 8-22 mins $Therapeutic Activity: 8-22 mins                       Wyona Almas, PT, DPT Acute Rehabilitation Services Pager [phone] Office [phone]    Deno Etienne 01/15/2020, 9:58 AM

## 2020-01-15 NOTE — Progress Notes (Signed)
At Approximately 0800 RN completed wound care per order noting heavy bright red bleeding throughout tx. Patient had poor toleration of procedure and received PRN pain medication to aide in comfort; minimally efective.  At approximately 0930 RN was called into patient room by PT and patient was noted to have palm sized bright red blood clots in the floor and was actively bleeding.  Dsg changed again completed per order and patient continued to cry out in pain.  PRN medication given to help patient to be calm and comfortable; medication effective.  Will continue to monitor.

## 2020-01-15 NOTE — Progress Notes (Signed)
[facility] Progress Note    Assessment/ Plan:   1. ESRD on hemodialysis  1. Monday Wednesday Friday ([place]). RUE AVF  Outpatient orders: 4 hours, revaclear300, BFR 400/dfr 600. 3k, 2.5cal, 138na, 35bicarb. No heparin.  2. HD on MWF schedule: 3K, 4h, 2-3L UF, no heparin, AVF 2. Anemia of chronic kidney disease  hgb stable  receives epogen 4k units outpatient 3. Secondary hyperparathyroidism; phos ok, recevies hectorol 3.56mcg w/ treatments 4. 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 Now no augmentin/doxy 5. HTN/volume status, bp at [facility] 1. Probe down post HD weights as able, BP ok   [name]  [facility]  Subjective:   NAE More awake/alert this AM Some pain in foot   Objective:   BP (!) 135/78 (BP Location: Left Arm)   Pulse 78   Temp 97.9 F (36.6 C) (Oral)   Resp 16   Ht 5\' 8"  (1.727 m)   Wt 85.6 kg   SpO2 98%   BMI 28.69 kg/m   Intake/Output Summary (Last 24 hours) at 01/15/2020 1117 Last data filed at 01/15/2020 2725 Gross per 24 hour  Intake 240 ml  Output 0 ml  Net 240 ml   Weight change:   Physical Exam: Gen: NAD in bed, pleasant CVS: Regular rate, S1-S2 Resp: Clear to auscultation bilaterally, normal work of breathing, no wheezes/rhonchi/rales/crackles, bilateral chest expansion Abd: Soft, nontender/nondistended Ext: Right upper extremity AVF with good bruit and thrill, right foot bandaged clean dressing Neuro: No focal deficits, aaox3   Imaging: No results found.  Labs: BMET Recent Labs  Lab 01/09/20 0430 01/10/20 0352 01/11/20 0428 01/13/20 1531  NA 133* 134* 132* 136  K 5.2* 3.4* 3.9 4.4  CL 95* 95* 92* 96*  CO2 21* 28 24  --   GLUCOSE 149* 137* 226* 105*  BUN 61* 25* 41* 40*  CREATININE 9.42* 5.44* 7.32* 8.70*  CALCIUM 8.0* 8.1* 8.0*  --    CBC Recent Labs  Lab 01/09/20 0430 01/09/20 0430 01/09/20 1450  01/10/20 0352 01/11/20 1400 01/13/20 1531  WBC 11.3*  --  11.2* 9.8 11.0*  --   NEUTROABS 8.6*  --   --  6.9  --   --   HGB 10.9*   < > 10.4* 11.8* 10.7* 13.9  HCT 31.6*   < > 30.3* 34.1* 31.0* 41.0  MCV 90.5  --  89.4 90.7 90.1  --   PLT 145*  --  132* 129* 168  --    < > = values in this interval not displayed.    Medications:    . amoxicillin-clavulanate  1 tablet Oral q1800  . aspirin  162 mg Oral Daily  . Chlorhexidine Gluconate Cloth  6 each Topical Q0600  . cyanocobalamin  1,000 mcg Intramuscular Q30 days  . docusate sodium  100 mg Oral BID  . doxercalciferol  3 mcg Intravenous Q M,W,F-HD  . doxycycline  100 mg Oral Q12H  . gabapentin  300 mg Oral Daily  . heparin injection (subcutaneous)  5,000 Units Subcutaneous Q8H  . insulin aspart  0-6 Units Subcutaneous TID WC  . insulin NPH Human  10 Units Subcutaneous BID AC  . labetalol  100 mg Oral BID  . levETIRAcetam  250 mg Oral BID  .  morphine injection  1 mg Intravenous Once  . multivitamin  1 tablet Oral QHS

## 2020-01-15 NOTE — Progress Notes (Signed)
PROGRESS NOTE    [name]  JSR:[id number] DOB: 08/11/1940 DOA: 01/05/2020 PCP: [name], MD    Brief Narrative:  79 y.o.femalewith medical history significant forESRD on HD MWF, IDT2DM, history of CVA, seizure disorder not taking AEDs, HTN, who presents to [facility] ED for evaluation of right foot cellulitis. Follows with her podiatrist regularly for management of an ulcer on the plantar surface of the right foot at the base of the first MTP. She had been wearing a boot for several weeks and noticed some swelling in her right leg since then. She went to see her podiatrist on 01/05/2020 who felt she was developing cellulitis of the right foot. She was subsequently sent to the ED for further evaluation and management.  In the ED, Temp 99.9 Fahrenheit, WBC 23.6K, hemoglobin 12.4K, platelets 83,000 (171,000 previously on 05/15/2019), lactic acid 1.4, sodium 136, potassium 4.6, bicarb 27, BUN 26, creatinine 6.66.  SARS-CoV-2 PCR negative. Blood cultures x2 negative to date.  Right foot x-ray shows soft tissue swelling with ulcer/wound along plantar aspect of the foot at the level of the first MTP. No definite acute osseous abnormality seen.  MRI right foot negative for osteomyelitis.  Poor dorsalis pedis pulses.  [place] ABI, B/L + PAD.  Daughter in the room requests referral to a different podiatrist.  TOC consulted to assist with this request.  Hospital course complicated by possible seizure activity, the morning of 01/09/2020, for which an EEG was done and neurology was consulted.  Discussed with neurology, Dr, Hortense Ramal.  She recommended continue home dose Keppra 250 mg twice daily and follow-up with neurology outpatient if MRI unremarkable for any acute intracranial findings.  No reported recurrence of seizure-like activities.  Assessment & Plan:   Principal Problem:   Cellulitis of right foot Active Problems:   Diabetes mellitus, type 2 (HCC)   Leukocytosis   Hypertension  associated with diabetes ([place])   ESRD (end stage renal disease) on dialysis ([place])   History of CVA (cerebrovascular accident)   Thrombocytopenia ([place])   Diabetic ulcer of right foot associated with diabetes mellitus due to underlying condition ([place])   Gangrene of toe of right foot ([place])   Sepsis, improving, secondary to cellulitis of right foot associated with diabetic ulcer, osteomyelitis ruled out. Presented with leukocytosis 20K, tachycardia, tachypnea, cellulitis distal dorsal right foot on Xray and MRI. Has plantar diabetic ulcer at base of first MTP without obvious drainage.  ESR 42  and CRP 20 No evidence of DVT on bilateral Doppler ultrasound No evidence of osteomyelitis on MRI right foot. Leukocytosis has improved Initially received Rocephin, then switched to IV Zosyn and IV vancomycin Started Augmentin on 01/10/2020 On 7/29, noted to have purulent drainage around foot wound Consulted orthopedic surgery, appreciate input.  Patient now status post first ray amputation PT/OT recs for HHPT thus far Increased bloody drainage noted by RN this AM. Will f/u with Orthopedic recs  Seizure disorder Generalized convulsions 7/27 AM.  Per her daughter ceased to take her AED Keppra about 1 year ago, did not return to neuro for refills.  EEG ordered, no epileptiform activity MRI brain ordered,  findings of fairly extensive post ischemic gliosis in the superior frontal and parietal lobes, much of which may be the sequela of bilateral infarct seen in 2015 Continue Keppra to 50 mg twice daily as recommended by neurology  Dr. [place] Crane discussed with neurology, Dr, Hortense Ramal.  She recommended continue home dose Keppra 250 mg twice daily and follow-up with neurology outpatient  if MRI unremarkable for any acute intracranial findings.  We will then discharge from a neurological standpoint.  No reported recurrence of seizure activities.  Continue seizure precautions. EEG recently reviewed, no evidence of  seizures.  Patient is to f/u with neuro as outpt after discharge  Peripheral arterial disease seen on bilateral lower extremity vascular ultrasound ABI done on 01/08/20, showing mild right lower extremity arterial disease, moderate left lower extremity arterial disease. Continue aspirin, 162 mg daily Possibly not on statin due to ESRD. Will need to follow-up with a cardiologist after discharge  History of CVA: Continue home aspirin 162 mg daily, monitor platelet counts as above. Not currently on statin therapy possibly due to ESRD.  Acute hyperkalemia, resolved with hemodialysis, in the setting of ESRD Serum potassium 5.8> 4.5> 5.2> 3.4 Electrolytes addressed by hemodialysis. Continue with dialysis as patient tolerates  Mild hypovolemic hyponatremia Volume status managed with hemodialysis Sodium had improved since undergoing dialysis  Bilateral lower extremity edema, resolved post hemodialysis, secondary to volume overload on exam Presented with 1+ pitting edema in lower extremities bilaterally, has improved after hemodialysis. No evidence of DVT on bilateral Doppler ultrasound noted  ESRD on HD MWF: Seen by Dr. Candiss Norse, appreciate nephrology's assistance. Continue hemodialysis as patient tolerates  Thrombocytopenia secondary to acute illness, improving: No evidence of bleeding Plt now normalized  Insulin-dependent type 2 diabetes, with hypoglycemia : A1C 7.2 on 01/05/20 Hypoglycemic AM of 7/26 Now on 2 units twice daily Lantus Cont to titrate insulin with goal of euglycemia  Hypertension: Blood pressure is at goal Continue home labetalol.  Hydralazine as needed. Continue to monitor blood pressure for now  Vitamin B12 deficiency Continue monthly B12 IM injection after hemodialysis 01/09/2020.  Acute toxic metabolic encephalopathy Recently noted to have increased confusion, encephalopathy. Likely secondary to sedating medications, most notably IV Dilaudid that  patient received for foot pain Recommend minimizing sedating medications if possible   DVT prophylaxis: Heparin subq Code Status: Full Family Communication: Pt in room, family currently not at bedside  Status is: Inpatient  Remains inpatient appropriate because:Unsafe d/c plan   Dispo:  Patient From: Home  Planned Disposition: Home with Health Care Svc  Expected discharge date: 01/11/20  Medically stable for discharge: No   Consultants:   Nephrology  Orthopedic Surgery  Procedures:   Right first ray amputation 01/13/2020  Antimicrobials: Anti-infectives (From admission, onward)   Start     Dose/Rate Route Frequency Ordered Stop   01/13/20 1445  ceFAZolin (ANCEF) IVPB 2g/100 mL premix  Status:  Discontinued        2 g 200 mL/hr over 30 Minutes Intravenous On call to O.R. 01/13/20 1440 01/13/20 1716   01/10/20 1800  amoxicillin-clavulanate (AUGMENTIN) 500-125 MG per tablet 500 mg     Discontinue     1 tablet Oral Daily-1800 01/09/20 1032     01/10/20 1000  doxycycline (VIBRA-TABS) tablet 100 mg     Discontinue     100 mg Oral Every 12 hours 01/09/20 1032     01/09/20 1200  vancomycin (VANCOCIN) IVPB 750 mg/150 ml premix        750 mg 150 mL/hr over 60 Minutes Intravenous Every M-W-F (Hemodialysis) 01/08/20 0557 01/09/20 1802   01/08/20 1400  piperacillin-tazobactam (ZOSYN) IVPB 2.25 g        2.25 g 100 mL/hr over 30 Minutes Intravenous Every 8 hours 01/08/20 0557 01/09/20 2359   01/08/20 0600  piperacillin-tazobactam (ZOSYN) IVPB 3.375 g        3.375  g 100 mL/hr over 30 Minutes Intravenous  Once 01/08/20 0550 01/08/20 0654   01/08/20 0600  vancomycin (VANCOCIN) IVPB 1000 mg/200 mL premix  Status:  Discontinued        1,000 mg 200 mL/hr over 60 Minutes Intravenous  Once 01/08/20 0550 01/08/20 0555   01/08/20 0600  vancomycin (VANCOREADY) IVPB 1750 mg/350 mL        1,750 mg 175 mL/hr over 120 Minutes Intravenous  Once 01/08/20 0557 01/08/20 1044   01/06/20 0800   cefTRIAXone (ROCEPHIN) 1 g in sodium chloride 0.9 % 100 mL IVPB  Status:  Discontinued        1 g 200 mL/hr over 30 Minutes Intravenous Every 24 hours 01/05/20 1906 01/06/20 1047   01/05/20 1800  cefTRIAXone (ROCEPHIN) 2 g in sodium chloride 0.9 % 100 mL IVPB  Status:  Discontinued        2 g 200 mL/hr over 30 Minutes Intravenous Every 24 hours 01/05/20 1752 01/05/20 1906      Subjective: This morning, difficult to arouse, patient recently given Ativan for pain and anxiety prior to my arrival  Objective: Vitals:   01/14/20 1429 01/14/20 2115 01/15/20 0500 01/15/20 1458  BP: (!) 149/68 (!) 157/65 (!) 135/78 (!) 150/65  Pulse: 76 82 78 87  Resp: _0 Temp: 99 F (37.2 C) 99.3 F (37.4 C) 97.9 F (36.6 C) 98.5 F (36.9 C)  TempSrc: Oral Oral Oral Oral  SpO2: 92% 96% 98% 96%  Weight:      Height:        Intake/Output Summary (Last 24 hours) at 01/15/2020 1516 Last data filed at 01/15/2020 1400 Gross per 24 hour  Intake 180 ml  Output 0 ml  Net 180 ml   Filed Weights   01/11/20 1415 01/11/20 1819 01/12/20 0504  Weight: 86.3 kg 83.3 kg 85.6 kg    Examination: General exam: Asleep, in no acute distress Respiratory system: normal chest rise, clear, no audible wheezing Cardiovascular system: regular rhythm, s1-s2 Gastrointestinal system: Nondistended, nontender, pos BS Central nervous system: No seizures, no tremors Extremities: No cyanosis, no joint deformities, postsurgical dressings are in place Skin: No rashes, no pallor Psychiatry: Unable to assess given current mentation  Data Reviewed: I have personally reviewed following labs and imaging studies  CBC: Recent Labs  Lab 01/09/20 0430 01/09/20 1450 01/10/20 0352 01/11/20 1400 01/13/20 1531  WBC 11.3* 11.2* 9.8 11.0*  --   NEUTROABS 8.6*  --  6.9  --   --   HGB 10.9* 10.4* 11.8* 10.7* 13.9  HCT 31.6* 30.3* 34.1* 31.0* 41.0  MCV 90.5 89.4 90.7 90.1  --   PLT 145* 132* 129* 168  --    Basic Metabolic  Panel: Recent Labs  Lab 01/09/20 0430 01/10/20 0352 01/11/20 0428 01/13/20 1531  NA 133* 134* 132* 136  K 5.2* 3.4* 3.9 4.4  CL 95* 95* 92* 96*  CO2 21* 28 24  --   GLUCOSE 149* 137* 226* 105*  BUN 61* 25* 41* 40*  CREATININE 9.42* 5.44* 7.32* 8.70*  CALCIUM 8.0* 8.1* 8.0*  --    GFR: Estimated Creatinine Clearance: 5.9 mL/min (A) (by C-G formula based on SCr of 8.7 mg/dL (H)). Liver Function Tests: No results for input(s): AST, ALT, ALKPHOS, BILITOT, PROT, ALBUMIN in the last 168 hours. No results for input(s): LIPASE, AMYLASE in the last 168 hours. No results for input(s): AMMONIA in the last 168 hours. Coagulation Profile: No results for input(s): INR,  PROTIME in the last 168 hours. Cardiac Enzymes: No results for input(s): CKTOTAL, CKMB, CKMBINDEX, TROPONINI in the last 168 hours. BNP (last 3 results) No results for input(s): PROBNP in the last 8760 hours. HbA1C: No results for input(s): HGBA1C in the last 72 hours. CBG: Recent Labs  Lab 01/14/20 1213 01/14/20 1649 01/14/20 2107 01/15/20 0741 01/15/20 1223  GLUCAP 175* 130* 115* 117* 184*   Lipid Profile: No results for input(s): CHOL, HDL, LDLCALC, TRIG, CHOLHDL, LDLDIRECT in the last 72 hours. Thyroid Function Tests: No results for input(s): TSH, T4TOTAL, FREET4, T3FREE, THYROIDAB in the last 72 hours. Anemia Panel: No results for input(s): VITAMINB12, FOLATE, FERRITIN, TIBC, IRON, RETICCTPCT in the last 72 hours. Sepsis Labs: No results for input(s): PROCALCITON, LATICACIDVEN in the last 168 hours.  Recent Results (from the past 240 hour(s))  Blood Culture (routine x 2)     Status: None   Collection Time: 01/05/20  6:30 PM   Specimen: BLOOD  Result Value Ref Range Status   Specimen Description BLOOD RIGHT ANTECUBITAL  Final   Special Requests   Final    BOTTLES DRAWN AEROBIC AND ANAEROBIC Blood Culture adequate volume   Culture   Final    NO GROWTH 5 DAYS Performed at [facility], [address], [place], [place] [address]    Report Status 01/10/2020 FINAL  Final  SARS Coronavirus 2 by RT PCR (hospital order, performed in [facility] hospital lab) Nasopharyngeal Nasopharyngeal Swab     Status: None   Collection Time: 01/05/20  6:30 PM   Specimen: Nasopharyngeal Swab  Result Value Ref Range Status   SARS Coronavirus 2 NEGATIVE NEGATIVE Final    Comment: (NOTE) SARS-CoV-2 target nucleic acids are NOT DETECTED.  The SARS-CoV-2 RNA is generally detectable in upper and lower respiratory specimens during the acute phase of infection. The lowest concentration of SARS-CoV-2 viral copies this assay can detect is 250 copies / mL. A negative result does not preclude SARS-CoV-2 infection and should not be used as the sole basis for treatment or other patient management decisions.  A negative result may occur with improper specimen collection / handling, submission of specimen other than nasopharyngeal swab, presence of viral mutation(s) within the areas targeted by this assay, and inadequate number of viral copies (<250 copies / mL). A negative result must be combined with clinical observations, patient history, and epidemiological information.  Fact Sheet for Patients:   StrictlyIdeas.no  Fact Sheet for Healthcare Providers: BankingDealers.co.za  This test is not yet approved or  cleared by the [place] FDA and has been authorized for detection and/or diagnosis of SARS-CoV-2 by FDA under an Emergency Use Authorization (EUA).  This EUA will remain in effect (meaning this test can be used) for the duration of the COVID-19 declaration under Section 564(b)(1) of the Act, 21 U.S.C. section 360bbb-3(b)(1), unless the authorization is terminated or revoked sooner.  Performed at [facility], [place] [address] [place],  [address]   Blood Culture (routine x 2)     Status: None   Collection Time: 01/05/20  7:04 PM    Specimen: BLOOD LEFT HAND  Result Value Ref Range Status   Specimen Description BLOOD LEFT HAND  Final   Special Requests   Final    BOTTLES DRAWN AEROBIC AND ANAEROBIC Blood Culture results may not be optimal due to an inadequate volume of blood received in culture bottles   Culture   Final    NO GROWTH 5  DAYS Performed at [facility], [place] [address] [place], [place] [address]    Report Status 01/10/2020 FINAL  Final  MRSA PCR Screening     Status: None   Collection Time: 01/08/20  5:48 AM   Specimen: Nasal Mucosa; Nasopharyngeal  Result Value Ref Range Status   MRSA by PCR NEGATIVE NEGATIVE Final    Comment:        The GeneXpert MRSA Assay (FDA approved for NASAL specimens only), is one component of a comprehensive MRSA colonization surveillance program. It is not intended to diagnose MRSA infection nor to guide or monitor treatment for MRSA infections. Performed at [facility], [place] [address], [place], [place] [address]      Radiology Studies: No results found.  Scheduled Meds: . amoxicillin-clavulanate  1 tablet Oral q1800  . aspirin  162 mg Oral Daily  . Chlorhexidine Gluconate Cloth  6 each Topical Q0600  . cyanocobalamin  1,000 mcg Intramuscular Q30 days  . docusate sodium  100 mg Oral BID  . doxercalciferol  3 mcg Intravenous Q M,W,F-HD  . doxycycline  100 mg Oral Q12H  . gabapentin  300 mg Oral Daily  . heparin injection (subcutaneous)  5,000 Units Subcutaneous Q8H  . insulin aspart  0-6 Units Subcutaneous TID WC  . insulin NPH Human  10 Units Subcutaneous BID AC  . labetalol  100 mg Oral BID  . levETIRAcetam  250 mg Oral BID  . multivitamin  1 tablet Oral QHS   Continuous Infusions: . sodium chloride 75 mL/hr at 01/13/20 1728     LOS: 9 days   Marylu Lund, MD [facility] Pager On Amion  If 7PM-7AM, please contact night-coverage 01/15/2020, 3:16 PM

## 2020-01-15 NOTE — Progress Notes (Signed)
OT Cancellation Note  Patient Details Name: [name] MRN: [id number] DOB: January 18, 1941   Cancelled Treatment:    Reason Eval/Treat Not Completed: Patient not medically ready;Other (comment) (Pt with increased drainage and limited with mobility.)   OT to continue to follow for OT eval later today or next availability.  Jefferey Pica, OTR/L Acute Rehabilitation Services Pager: [phone] Office: [place] C 01/15/2020, 11:15 AM

## 2020-01-15 NOTE — Progress Notes (Signed)
     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 She is sleeping,somulent.  Patient reports pain as mild.    Objective:   VITALS:  Temp:  [97.9 F (36.6 C)-99.3 F (37.4 C)] 97.9 F (36.6 C) (08/01 0500) Pulse Rate:  [78-82] 78 (08/01 0500) Resp:  [16] 16 (08/01 0500) BP: (135-157)/(65-78) 135/78 (08/01 0500) SpO2:  [96 %-98 %] 98 % (08/01 0500)  Neurologically intact ABD soft Neurovascular intact Sensation intact distally Intact pulses distally Dorsiflexion/Plantar flexion intact Incision: dressing C/D/I and scant drainage   LABS Recent Labs    01/13/20 1531  HGB 13.9   Recent Labs    01/13/20 1531  NA 136  K 4.4  CL 96*  BUN 40*  CREATININE 8.70*  GLUCOSE 105*   No results for input(s): LABPT, INR in the last 72 hours.   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  Basil Dess 01/15/2020, 2:46 PMPatient ID: [name], female   DOB: 03/29/1941, 79 y.o.   MRN: [id number]

## 2020-01-16 LAB — RENAL FUNCTION PANEL
Albumin: 2.5 g/dL — ABNORMAL LOW (ref 3.5–5.0)
Anion gap: 16 — ABNORMAL HIGH (ref 5–15)
BUN: 54 mg/dL — ABNORMAL HIGH (ref 8–23)
CO2: 25 mmol/L (ref 22–32)
Calcium: 8.1 mg/dL — ABNORMAL LOW (ref 8.9–10.3)
Chloride: 92 mmol/L — ABNORMAL LOW (ref 98–111)
Creatinine, Ser: 8.64 mg/dL — ABNORMAL HIGH (ref 0.44–1.00)
GFR calc Af Amer: 5 mL/min — ABNORMAL LOW (ref >60)
GFR calc non Af Amer: 4 mL/min — ABNORMAL LOW (ref >60)
Glucose, Bld: 125 mg/dL — ABNORMAL HIGH (ref 70–99)
Phosphorus: 6.6 mg/dL — ABNORMAL HIGH (ref 2.5–4.6)
Potassium: 4.9 mmol/L (ref 3.5–5.1)
Sodium: 133 mmol/L — ABNORMAL LOW (ref 135–145)

## 2020-01-16 LAB — CBC
HCT: 27.1 % — ABNORMAL LOW (ref 36.0–46.0)
Hemoglobin: 9.2 g/dL — ABNORMAL LOW (ref 12.0–15.0)
MCH: 30.8 pg (ref 26.0–34.0)
MCHC: 33.9 g/dL (ref 30.0–36.0)
MCV: 90.6 fL (ref 80.0–100.0)
Platelets: 161 10*3/uL (ref 150–400)
RBC: 2.99 MIL/uL — ABNORMAL LOW (ref 3.87–5.11)
RDW: 14.6 % (ref 11.5–15.5)
WBC: 17.4 10*3/uL — ABNORMAL HIGH (ref 4.0–10.5)
nRBC: 0 % (ref 0.0–0.2)

## 2020-01-16 LAB — GLUCOSE, CAPILLARY
Glucose-Capillary: 66 mg/dL — ABNORMAL LOW (ref 70–99)
Glucose-Capillary: 72 mg/dL (ref 70–99)
Glucose-Capillary: 134 mg/dL — ABNORMAL HIGH (ref 70–99)
Glucose-Capillary: 91 mg/dL (ref 70–99)
Glucose-Capillary: 212 mg/dL — ABNORMAL HIGH (ref 70–99)
Glucose-Capillary: 212 mg/dL — ABNORMAL HIGH (ref 70–99)

## 2020-01-16 MED ORDER — LIDOCAINE HCL (PF) 1 % IJ SOLN: 5.0000 mL | INTRAMUSCULAR | Status: DC | PRN

## 2020-01-16 MED ORDER — PENTAFLUOROPROP-TETRAFLUOROETH EX AERO: 1.0000 "application " | INHALATION_SPRAY | CUTANEOUS | Status: DC | PRN

## 2020-01-16 MED ORDER — DEXTROSE 50 % IV SOLN
INTRAVENOUS | Status: AC
  Filled 2020-01-16: qty 50

## 2020-01-16 MED ORDER — HEPARIN SODIUM (PORCINE) 1000 UNIT/ML DIALYSIS
1000.0000 [IU] | INTRAMUSCULAR | Status: DC | PRN
  Filled 2020-01-16: qty 1

## 2020-01-16 MED ORDER — DARBEPOETIN ALFA 60 MCG/0.3ML IJ SOSY
PREFILLED_SYRINGE | INTRAMUSCULAR | Status: AC
  Filled 2020-01-16: qty 0.3

## 2020-01-16 MED ORDER — DEXTROSE 50 % IV SOLN
1.0000 | Freq: Once | INTRAVENOUS | Status: AC
  Administered 2020-01-16: 50 mL via INTRAVENOUS

## 2020-01-16 MED ORDER — DOXERCALCIFEROL 4 MCG/2ML IV SOLN
INTRAVENOUS | Status: AC
  Filled 2020-01-16: qty 2

## 2020-01-16 MED ORDER — SODIUM CHLORIDE 0.9 % IV SOLN: 100.0000 mL | INTRAVENOUS | Status: DC | PRN

## 2020-01-16 MED ORDER — ALTEPLASE 2 MG IJ SOLR: 2.0000 mg | Freq: Once | INTRAMUSCULAR | Status: DC | PRN

## 2020-01-16 MED ORDER — LIDOCAINE-PRILOCAINE 2.5-2.5 % EX CREA
1.0000 "application " | TOPICAL_CREAM | CUTANEOUS | Status: DC | PRN
  Filled 2020-01-16: qty 5

## 2020-01-16 MED ORDER — DARBEPOETIN ALFA 60 MCG/0.3ML IJ SOSY
60.0000 ug | PREFILLED_SYRINGE | INTRAMUSCULAR | Status: DC
  Administered 2020-01-16: 60 ug via INTRAVENOUS

## 2020-01-16 NOTE — Procedures (Signed)
Patient was seen on dialysis and the procedure was supervised.  BFR 400  Via AVF BP is  135/63.   Patient appears to be tolerating treatment well  Louis Meckel 01/16/2020

## 2020-01-16 NOTE — Progress Notes (Signed)
[facility] Progress Note    Assessment/ Plan:   1. ESRD on hemodialysis  1. Monday Wednesday Friday ([place]). RUE AVF  Outpatient orders: 4 hours, revaclear300, BFR 400/dfr 600. 3k, 2.5cal, 138na, 35bicarb. No heparin.  2. HD on MWF schedule: 3K, 4h, 2-3L UF, no heparin, AVF 2. Anemia of chronic kidney disease  hgb dropping  receives epogen 4k units outpatient- have not given here yet-  Will order dse 3. Secondary hyperparathyroidism; phos high, she cannot recall being on a binder- no action for now.  continue hectorol 3.42mcg w/ treatments 4. 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 Now no augmentin/doxy--- WBC appears to be increasing  5. HTN/volume status, bp at [facility] 1. Probe down post HD weights as able, BP ok- no scheduled meds   [facility]  Subjective:   NAE- seen on HD  Some pain in foot-  Says may go home tonight ??    Objective:   BP (!) 135/63 (BP Location: Left Arm)   Pulse 76   Temp 99.2 F (37.3 C) (Oral)   Resp (!) 10   Ht 5\' 8"  (1.727 m)   Wt 78.2 kg   SpO2 94%   BMI 26.21 kg/m   Intake/Output Summary (Last 24 hours) at 01/16/2020 0931 Last data filed at 01/16/2020 8938 Gross per 24 hour  Intake 0 ml  Output 0 ml  Net 0 ml   Weight change:   Physical Exam: Gen: NAD in bed, pleasant CVS: Regular rate, S1-S2 Resp: Clear to auscultation bilaterally, normal work of breathing, no wheezes/rhonchi/rales/crackles, bilateral chest expansion Abd: Soft, nontender/nondistended Ext: Right upper extremity AVF with good bruit and thrill, right foot bandaged clean dressing Neuro: No focal deficits, aaox3   Imaging: No results found.  Labs: BMET Recent Labs  Lab 01/10/20 0352 01/11/20 0428 01/13/20 1531 01/16/20 0650  NA 134* 132* 136 133*  K 3.4* 3.9 4.4 4.9  CL 95* 92* 96* 92*  CO2 28 24  --  25  GLUCOSE 137*  226* 105* 125*  BUN 25* 41* 40* 54*  CREATININE 5.44* 7.32* 8.70* 8.64*  CALCIUM 8.1* 8.0*  --  8.1*  PHOS  --   --   --  6.6*   CBC Recent Labs  Lab 01/09/20 1450 01/09/20 1450 01/10/20 0352 01/11/20 1400 01/13/20 1531 01/16/20 0650  WBC 11.2*  --  9.8 11.0*  --  17.4*  NEUTROABS  --   --  6.9  --   --   --   HGB 10.4*   < > 11.8* 10.7* 13.9 9.2*  HCT 30.3*   < > 34.1* 31.0* 41.0 27.1*  MCV 89.4  --  90.7 90.1  --  90.6  PLT 132*  --  129* 168  --  161   < > = values in this interval not displayed.    Medications:    . amoxicillin-clavulanate  1 tablet Oral q1800  . aspirin  162 mg Oral Daily  . Chlorhexidine Gluconate Cloth  6 each Topical Q0600  . cyanocobalamin  1,000 mcg Intramuscular Q30 days  . dextrose      . docusate sodium  100 mg Oral BID  . doxercalciferol  3 mcg Intravenous Q M,W,F-HD  . doxycycline  100 mg Oral Q12H  . gabapentin  300 mg Oral Daily  . heparin injection (subcutaneous)  5,000 Units Subcutaneous Q8H  .  insulin aspart  0-6 Units Subcutaneous TID WC  . insulin NPH Human  10 Units Subcutaneous BID AC  . labetalol  100 mg Oral BID  . levETIRAcetam  250 mg Oral BID  . multivitamin  1 tablet Oral QHS

## 2020-01-16 NOTE — Progress Notes (Signed)
Inpatient Diabetes Program Recommendations  AACE/ADA: New Consensus Statement on Inpatient Glycemic Control (2015)  Target Ranges:  Prepandial:   less than 140 mg/dL      Peak postprandial:   less than 180 mg/dL (1-2 hours)      Critically ill patients:  140 - 180 mg/dL   Results for [name], [name] (MRN [id number]) as of 01/16/2020 13:29  Ref. Range 01/15/2020 07:41 01/15/2020 12:23 01/15/2020 16:27 01/15/2020 21:26  Glucose-Capillary Latest Ref Range: 70 - 99 mg/dL 117 (H)  NPH Insulin HELD 184 (H)  1 unit NOVOLOG  207 (H)  2 units NOVOLOG +  10 units NPH Insulin  155 (H)   Results for [name], [name] (MRN [id number]) as of 01/16/2020 13:29  Ref. Range 01/16/2020 06:11 01/16/2020 06:18 01/16/2020 06:37 01/16/2020 11:51  Glucose-Capillary Latest Ref Range: 70 - 99 mg/dL 66 (L) 72 134 (H)  NPH Insulin HELD 91    Home DM Meds: NPH Insulin 14 units BID   Current Insulin Orders: NPH Insulin 10 units BID (with meals)          Novolog 0-6 units TID ac   CBGs yest OK  Of note, AM dose NPH HELD yesterday AM--did get NPH yest afternoon.   Mild HYPO this AM (CBG 66). AM dose NPH HELD again this AM--CBG at 12pm 91 mg/dl.    MD- Please consider reduction of NPH Insulin to 7 units BID (with Breakfast and at Bedtime)     --Will follow patient during hospitalization--  Wyn Quaker RN, MSN, CDE Diabetes Coordinator Inpatient Glycemic Control Team Team Pager: [phone] (8a-5p)

## 2020-01-16 NOTE — Evaluation (Signed)
Occupational Therapy Evaluation Patient Details Name: [name] MRN: [id number] DOB: 10/01/1940 Today's Date: 01/16/2020    History of Present Illness [name] is a 79 y.o. female with PMH of ESRD on HD MWF, IDT2DM, history of CVA, HTN who wasadmitted on 01/05/2020 for right foot cellulitis and has been on Vanc/Zosyn. On 7/26  patient was noted to have generalized shaking concerning for seizure x2, EEG WNL. 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     Clinical Impression   Pt PTA: Pt living with family and nearly independent prior. Pt limited by decreased strength, increased pain in RLE, decreased ability to care for self and decreased mobility. Pt requires assist for transfers with maxA and pt unable to maintain NWB status safely. Pt maxA for ADL overall and minA for bed mobility. Pt requires cues for sequencing through tasks. Pt would benefit from continued OT skilled services. OT following acutely.      Follow Up Recommendations  SNF    Equipment Recommendations  Other (comment) (defer to next facility)    Recommendations for Other Services       Precautions / Restrictions Precautions Precautions: Fall Required Braces or Orthoses: Other Brace Other Brace: post op shoe Restrictions Weight Bearing Restrictions: Yes RLE Weight Bearing: Touchdown weight bearing      Mobility Bed Mobility Overal bed mobility: Needs Assistance Bed Mobility: Sit to Supine;Rolling Rolling: Min guard   Supine to sit: Min assist     General bed mobility comments: Pt sitting EOB with pain in RLE.  Transfers Overall transfer level: Needs assistance Equipment used: Rolling walker (2 wheeled) Transfers: Sit to/from [facility] Sit to Stand: Max assist Stand pivot transfers: Max assist;From elevated surface       General transfer comment: Pt limited by NWB status in RLE and requiring maxA for pivot with LLE slightly sliding on floor and heavy use of RW.    Balance  Overall balance assessment: Needs assistance Sitting-balance support: Feet unsupported Sitting balance-Leahy Scale: Good     Standing balance support: Bilateral upper extremity supported;During functional activity Standing balance-Leahy Scale: Fair Standing balance comment: RW for stability and weakness/unsteadiness                           ADL either performed or assessed with clinical judgement   ADL Overall ADL's : Needs assistance/impaired Eating/Feeding: Set up;Sitting   Grooming: Set up;Sitting   Upper Body Bathing: Minimal assistance;Sitting   Lower Body Bathing: Maximal assistance;Sitting/lateral leans;Sit to/from stand   Upper Body Dressing : Minimal assistance;Sitting   Lower Body Dressing: Maximal assistance;Sitting/lateral leans;Sit to/from stand;Cueing for safety   Toilet Transfer: Maximal assistance;Stand-pivot;Cueing for safety;BSC;RW Toilet Transfer Details (indicate cue type and reason): use of RW; pt's R foot abiding by NWB with difficulty Toileting- Clothing Manipulation and Hygiene: Maximal assistance;+2 for physical assistance;+2 for safety/equipment;Sitting/lateral lean;Sit to/from stand       Functional mobility during ADLs: Maximal assistance;Rolling walker;Cueing for safety General ADL Comments: Pt limited by decreased strength, increased pain in RLE, decreased ability to care for self and decreased mobility. Pt requires assist for transfers with maxA and pt unable to maintain NWB status safely.     Vision Baseline Vision/History: Wears glasses Wears Glasses: At all times Patient Visual Report: No change from baseline Vision Assessment?: No apparent visual deficits     Perception     Praxis      Pertinent Vitals/Pain Pain Assessment: Faces Faces Pain Scale: Hurts  even more Pain Location: surgical site Pain Descriptors / Indicators: Sharp;Grimacing;Guarding Pain Intervention(s): Limited activity within patient's tolerance;Monitored  during session     Hand Dominance Right   Extremity/Trunk Assessment Upper Extremity Assessment Upper Extremity Assessment: Generalized weakness   Lower Extremity Assessment Lower Extremity Assessment: Generalized weakness   Cervical / Trunk Assessment Cervical / Trunk Assessment: Normal   Communication Communication Communication: No difficulties   Cognition Arousal/Alertness: Awake/alert Behavior During Therapy: WFL for tasks assessed/performed Overall Cognitive Status: Impaired/Different from baseline Area of Impairment: Orientation;Attention;Memory;Awareness;Problem solving                 Orientation Level: Disoriented to;Time Current Attention Level: Sustained Memory: Decreased short-term memory;Decreased recall of precautions     Awareness: Intellectual Problem Solving: Slow processing;Difficulty sequencing;Requires verbal cues General Comments: Pt knew it was 2021, followed commands; pt unable to pinpoint pain experience and required mutliple cues to sequence through tasks   General Comments  VSS. Pain increased in LLE and cognitive deficits known.    Exercises     Shoulder Instructions      Home Living Family/patient expects to be discharged to:: Private residence Living Arrangements: Children Available Help at Discharge: Family;Available 24 hours/day Type of Home: House Home Access: Stairs to enter [employer] of Steps: 4 Entrance Stairs-Rails: None Home Layout: One level     Bathroom Shower/Tub: Teacher, early years/pre: Standard Bathroom Accessibility: Yes   Home Equipment: Cane - single point;Walker - 2 wheels;Shower seat          Prior Functioning/Environment Level of Independence: Needs assistance  Gait / Transfers Assistance Needed: ambulates with RW ADL's / Homemaking Assistance Needed: pt supervision to mod I with BADL and IADLs provided for pt (pt reports cooking/cleaning lightly)            OT Problem  List: Decreased strength;Decreased activity tolerance;Impaired balance (sitting and/or standing);Decreased safety awareness;Impaired UE functional use;Decreased knowledge of use of DME or AE;Decreased knowledge of precautions;Pain;Increased edema      OT Treatment/Interventions: Self-care/ADL training;Therapeutic exercise;Energy conservation;Therapeutic activities;Patient/family education;Balance training;DME and/or AE instruction    OT Goals(Current goals can be found in the care plan section) Acute Rehab OT Goals Patient Stated Goal: less pain OT Goal Formulation: With patient Time For Goal Achievement: 01/30/20 Potential to Achieve Goals: Good ADL Goals Pt Will Perform Grooming: with min guard assist;standing Pt Will Perform Lower Body Dressing: with min assist;sitting/lateral leans;sit to/from stand Pt Will Transfer to Toilet: with min guard assist;ambulating Pt Will Perform Toileting - Clothing Manipulation and hygiene: with min guard assist;sitting/lateral leans;sit to/from stand Additional ADL Goal #1: Pt will increase to supervisionA for bed mobility as precursor for ADL tasks.  OT Frequency: Min 2X/week   Barriers to D/C:            Co-evaluation              AM-PAC OT "6 Clicks" Daily Activity     Outcome Measure Help from another person eating meals?: None Help from another person taking care of personal grooming?: A Little Help from another person toileting, which includes using toliet, bedpan, or urinal?: A Lot Help from another person bathing (including washing, rinsing, drying)?: A Lot Help from another person to put on and taking off regular upper body clothing?: A Little Help from another person to put on and taking off regular lower body clothing?: A Lot 6 Click Score: 16   End of Session Equipment Utilized During Treatment: Gait belt;Rolling walker Nurse Communication:  Mobility status;Weight bearing status  Activity Tolerance: Patient tolerated treatment  well;Patient limited by pain Patient left: in chair;with call bell/phone within reach;with chair alarm set  OT Visit Diagnosis: Unsteadiness on feet (R26.81);Muscle weakness (generalized) (M62.81);Pain Pain - Right/Left: Left Pain - part of body: Leg                Time: 3744-5146 OT Time Calculation (min): 24 min Charges:  OT General Charges $OT Visit: 1 Visit OT Evaluation $OT Eval Moderate Complexity: 1 Mod OT Treatments $Self Care/Home Management : 8-22 mins  Jefferey Pica, OTR/L Acute Rehabilitation Services Pager: [phone] Office: [place] C 01/16/2020, 3:30 PM

## 2020-01-16 NOTE — Progress Notes (Signed)
PROGRESS NOTE    [name]  KWI:[id number] DOB: [birth year] DOA: 01/05/2020 PCP: [name], MD    Brief Narrative:  79 y.o.femalewith medical history significant forESRD on HD MWF, IDT2DM, history of CVA, seizure disorder not taking AEDs, HTN, who presents to [facility] ED for evaluation of right foot cellulitis. Follows with her podiatrist regularly for management of an ulcer on the plantar surface of the right foot at the base of the first MTP. She had been wearing a boot for several weeks and noticed some swelling in her right leg since then. She went to see her podiatrist on 01/05/2020 who felt she was developing cellulitis of the right foot. She was subsequently sent to the ED for further evaluation and management.  In the ED, Temp 99.9 Fahrenheit, WBC 23.6K, hemoglobin 12.4K, platelets 83,000 (171,000 previously on 05/15/2019), lactic acid 1.4, sodium 136, potassium 4.6, bicarb 27, BUN 26, creatinine 6.66.  SARS-CoV-2 PCR negative. Blood cultures x2 negative to date.  Right foot x-ray shows soft tissue swelling with ulcer/wound along plantar aspect of the foot at the level of the first MTP. No definite acute osseous abnormality seen.  MRI right foot negative for osteomyelitis.  Poor dorsalis pedis pulses.  [place] ABI, B/L + PAD.  Daughter in the room requests referral to a different podiatrist.  TOC consulted to assist with this request.  Hospital course complicated by possible seizure activity, the morning of 01/09/2020, for which an EEG was done and neurology was consulted.  Discussed with neurology, Dr, Hortense Ramal.  She recommended continue home dose Keppra 250 mg twice daily and follow-up with neurology outpatient if MRI unremarkable for any acute intracranial findings.  No reported recurrence of seizure-like activities.  Assessment & Plan:   Principal Problem:   Cellulitis of right foot Active Problems:   Diabetes mellitus, type 2 (HCC)   Leukocytosis   Hypertension  associated with diabetes ([place])   ESRD (end stage renal disease) on dialysis ([place])   History of CVA (cerebrovascular accident)   Thrombocytopenia ([place])   Diabetic ulcer of right foot associated with diabetes mellitus due to underlying condition ([place])   Gangrene of toe of right foot ([place])   Sepsis, improving, secondary to cellulitis of right foot associated with diabetic ulcer, osteomyelitis ruled out. Presented with leukocytosis 20K, tachycardia, tachypnea, cellulitis distal dorsal right foot on Xray and MRI. Has plantar diabetic ulcer at base of first MTP without obvious drainage.  ESR 42  and CRP 20 No evidence of DVT on bilateral Doppler ultrasound No evidence of osteomyelitis on MRI right foot. Leukocytosis has improved Initially received Rocephin, then switched to IV Zosyn and IV vancomycin Started Augmentin on 01/10/2020 On 7/29, noted to have purulent drainage around foot wound Consulted orthopedic surgery, appreciate input.  Patient now status post first ray amputation PT/OT initial recs for Home health prior to surgery, now recs for SNF. TOC consulted Cont to follow orthopedic Surgery recs  Seizure disorder Generalized convulsions 7/27 AM.  Per her daughter ceased to take her AED Keppra about 1 year ago, did not return to neuro for refills.  EEG ordered, no epileptiform activity MRI brain ordered,  findings of fairly extensive post ischemic gliosis in the superior frontal and parietal lobes, much of which may be the sequela of bilateral infarct seen in 2015 Continue Keppra to 50 mg twice daily as recommended by neurology  Dr. [place] Crane discussed with neurology, Dr, Hortense Ramal.  She recommended continue home dose Keppra 250 mg twice daily and follow-up with  neurology outpatient if MRI unremarkable for any acute intracranial findings.  We will then discharge from a neurological standpoint.  No reported recurrence of seizure activities.  Continue seizure precautions. EEG recently reviewed, no  evidence of seizures.  Patient is to f/u with neuro as outpt when pt is discharged  Peripheral arterial disease seen on bilateral lower extremity vascular ultrasound ABI done on 01/08/20, showing mild right lower extremity arterial disease, moderate left lower extremity arterial disease. Continue aspirin, 162 mg daily Possibly not on statin due to ESRD. Will need to follow-up with a cardiologist after discharge  History of CVA: Continue home aspirin 162 mg daily, monitor platelet counts as above. Not currently on statin therapy possibly due to ESRD.  Acute hyperkalemia, resolved with hemodialysis, in the setting of ESRD Electrolytes addressed by hemodialysis and potassium normalized Continue with dialysis as patient tolerates  Mild hypovolemic hyponatremia Volume status managed with hemodialysis Sodium had improved since undergoing dialysis  Bilateral lower extremity edema, resolved post hemodialysis, secondary to volume overload on exam Presented with 1+ pitting edema in lower extremities bilaterally, has improved after hemodialysis. No evidence of DVT on bilateral Doppler ultrasound as noted  ESRD on HD MWF: Seen by Dr. Candiss Norse, appreciate nephrology's assistance. Continue hemodialysis as patient tolerates  Thrombocytopenia secondary to acute illness, improving: No evidence of bleeding Plts now within normal limits  Insulin-dependent type 2 diabetes, with hypoglycemia : A1C 7.2 on 01/05/20 Hypoglycemic AM of 7/26 Now on 2 units twice daily Lantus Cont to titrate insulin with goal of euglycemia  Hypertension: Blood pressure is at goal Continue home labetalol.  Hydralazine as needed. Continue to monitor blood pressure for now  Vitamin B12 deficiency Continue monthly B12 IM injection after hemodialysis 01/09/2020.  Acute toxic metabolic encephalopathy Recently noted to have increased confusion, encephalopathy. 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 01/13/20 1440 01/13/20 1716   01/10/20 1800  amoxicillin-clavulanate (AUGMENTIN) 500-125 MG per tablet 500 mg     Discontinue     1 tablet Oral Daily-1800 01/09/20 1032     01/10/20 1000  doxycycline (VIBRA-TABS) tablet 100 mg     Discontinue     100 mg Oral Every 12 hours 01/09/20 1032     01/09/20 1200  vancomycin (VANCOCIN) IVPB 750 mg/150 ml premix        750 mg 150 mL/hr over 60 Minutes Intravenous Every M-W-F (Hemodialysis) 01/08/20 0557 01/09/20 1802   01/08/20 1400  piperacillin-tazobactam (ZOSYN) IVPB 2.25 g        2.25 g 100 mL/hr over 30 Minutes Intravenous Every 8 hours 01/08/20 0557 01/09/20 2359   01/08/20 0600  piperacillin-tazobactam (ZOSYN) IVPB 3.375 g        3.375  g 100 mL/hr over 30 Minutes Intravenous  Once 01/08/20 0550 01/08/20 0654   01/08/20 0600  vancomycin (VANCOCIN) IVPB 1000 mg/200 mL premix  Status:  Discontinued        1,000 mg 200 mL/hr over 60 Minutes Intravenous  Once 01/08/20 0550 01/08/20 0555   01/08/20 0600  vancomycin (VANCOREADY) IVPB 1750 mg/350 mL        1,750 mg 175 mL/hr over 120 Minutes Intravenous  Once 01/08/20 0557 01/08/20 1044    01/06/20 0800  cefTRIAXone (ROCEPHIN) 1 g in sodium chloride 0.9 % 100 mL IVPB  Status:  Discontinued        1 g 200 mL/hr over 30 Minutes Intravenous Every 24 hours 01/05/20 1906 01/06/20 1047   01/05/20 1800  cefTRIAXone (ROCEPHIN) 2 g in sodium chloride 0.9 % 100 mL IVPB  Status:  Discontinued        2 g 200 mL/hr over 30 Minutes Intravenous Every 24 hours 01/05/20 1752 01/05/20 1906      Subjective: Seen on HD this AM. Reports foot pain improved while on HD  Objective: Vitals:   01/16/20 1057 01/16/20 1100 01/16/20 1121 01/16/20 1456  BP: (!) 156/61 (!) 142/61 138/63 (!) 107/42  Pulse: 78 79 82 73  Resp: 14 16 16 17   Temp:  98.6 F (37 C) 98.6 F (37 C) 98.3 F (36.8 C)  TempSrc:  Oral Oral Oral  SpO2: 94%  99% 97%  Weight: 75.2 kg     Height:        Intake/Output Summary (Last 24 hours) at 01/16/2020 1634 Last data filed at 01/16/2020 1255 Gross per 24 hour  Intake 0 ml  Output 3000 ml  Net -3000 ml   Filed Weights   01/12/20 0504 01/16/20 0647 01/16/20 1057  Weight: 85.6 kg 78.2 kg 75.2 kg    Examination: General exam: Awake, laying in bed, in nad Respiratory system: Normal respiratory effort, no wheezing Cardiovascular system: regular rate, s1, s2 Gastrointestinal system: Soft, nondistended, positive BS Central nervous system: CN2-12 grossly intact, strength intact Extremities: Perfused, no clubbing Skin: Normal skin turgor, no notable skin lesions seen Psychiatry: Mood normal // no visual hallucinations    Data Reviewed: I have personally reviewed following labs and imaging studies  CBC: Recent Labs  Lab 01/10/20 0352 01/11/20 1400 01/13/20 1531 01/16/20 0650  WBC 9.8 11.0*  --  17.4*  NEUTROABS 6.9  --   --   --   HGB 11.8* 10.7* 13.9 9.2*  HCT 34.1* 31.0* 41.0 27.1*  MCV 90.7 90.1  --  90.6  PLT 129* 168  --  161   Basic Metabolic Panel: Recent Labs  Lab 01/10/20 0352 01/11/20 0428 01/13/20 1531 01/16/20 0650  NA 134* 132* 136 133*  K  3.4* 3.9 4.4 4.9  CL 95* 92* 96* 92*  CO2 28 24  --  25  GLUCOSE 137* 226* 105* 125*  BUN 25* 41* 40* 54*  CREATININE 5.44* 7.32* 8.70* 8.64*  CALCIUM 8.1* 8.0*  --  8.1*  PHOS  --   --   --  6.6*   GFR: Estimated Creatinine Clearance: 5.2 mL/min (A) (by C-G formula based on SCr of 8.64 mg/dL (H)). Liver Function Tests: Recent Labs  Lab 01/16/20 0650  ALBUMIN 2.5*   No results for input(s): LIPASE, AMYLASE in the last 168 hours. No results for input(s): AMMONIA in the last 168 hours. Coagulation Profile: No results for input(s): INR, PROTIME in the last 168 hours. Cardiac Enzymes:  No results for input(s): CKTOTAL, CKMB, CKMBINDEX, TROPONINI in the last 168 hours. BNP (last 3 results) No results for input(s): PROBNP in the last 8760 hours. HbA1C: No results for input(s): HGBA1C in the last 72 hours. CBG: Recent Labs  Lab 01/15/20 2126 01/16/20 0611 01/16/20 0618 01/16/20 0637 01/16/20 1151  GLUCAP 155* 66* 72 134* 91   Lipid Profile: No results for input(s): CHOL, HDL, LDLCALC, TRIG, CHOLHDL, LDLDIRECT in the last 72 hours. Thyroid Function Tests: No results for input(s): TSH, T4TOTAL, FREET4, T3FREE, THYROIDAB in the last 72 hours. Anemia Panel: No results for input(s): VITAMINB12, FOLATE, FERRITIN, TIBC, IRON, RETICCTPCT in the last 72 hours. Sepsis Labs: No results for input(s): PROCALCITON, LATICACIDVEN in the last 168 hours.  Recent Results (from the past 240 hour(s))  MRSA PCR Screening     Status: None   Collection Time: 01/08/20  5:48 AM   Specimen: Nasal Mucosa; Nasopharyngeal  Result Value Ref Range Status   MRSA by PCR NEGATIVE NEGATIVE Final    Comment:        The GeneXpert MRSA Assay (FDA approved for NASAL specimens only), is one component of a comprehensive MRSA colonization surveillance program. It is not intended to diagnose MRSA infection nor to guide or monitor treatment for MRSA infections. Performed at [facility], [place]  [address], [place], [place] [address]      Radiology Studies: No results found.  Scheduled Meds: . amoxicillin-clavulanate  1 tablet Oral q1800  . aspirin  162 mg Oral Daily  . Chlorhexidine Gluconate Cloth  6 each Topical Q0600  . cyanocobalamin  1,000 mcg Intramuscular Q30 days  . Darbepoetin Alfa      . darbepoetin (ARANESP) injection - DIALYSIS  60 mcg Intravenous Q Mon-HD  . dextrose      . docusate sodium  100 mg Oral BID  . doxercalciferol      . doxercalciferol  3 mcg Intravenous Q M,W,F-HD  . doxycycline  100 mg Oral Q12H  . gabapentin  300 mg Oral Daily  . heparin injection (subcutaneous)  5,000 Units Subcutaneous Q8H  . insulin aspart  0-6 Units Subcutaneous TID WC  . insulin NPH Human  10 Units Subcutaneous BID AC  . labetalol  100 mg Oral BID  . levETIRAcetam  250 mg Oral BID  . multivitamin  1 tablet Oral QHS   Continuous Infusions: . sodium chloride 75 mL/hr at 01/13/20 1728     LOS: 10 days   Marylu Lund, MD [facility] Pager On Amion  If 7PM-7AM, please contact night-coverage 01/16/2020, 4:34 PM

## 2020-01-17 LAB — GLUCOSE, CAPILLARY
Glucose-Capillary: 227 mg/dL — ABNORMAL HIGH (ref 70–99)
Glucose-Capillary: 227 mg/dL — ABNORMAL HIGH (ref 70–99)
Glucose-Capillary: 216 mg/dL — ABNORMAL HIGH (ref 70–99)

## 2020-01-17 MED ORDER — INSULIN NPH (HUMAN) (ISOPHANE) 100 UNIT/ML ~~LOC~~ SUSP: 7.0000 [IU] | Freq: Two times a day (BID) | SUBCUTANEOUS | 0 refills | Status: DC

## 2020-01-17 MED ORDER — LABETALOL HCL 100 MG PO TABS: 100.0000 mg | ORAL_TABLET | Freq: Two times a day (BID) | ORAL | 0 refills | Status: DC

## 2020-01-17 MED ORDER — INSULIN NPH (HUMAN) (ISOPHANE) 100 UNIT/ML ~~LOC~~ SUSP
7.0000 [IU] | Freq: Two times a day (BID) | SUBCUTANEOUS | Status: DC
  Administered 2020-01-17: 7 [IU] via SUBCUTANEOUS

## 2020-01-17 MED ORDER — OXYCODONE HCL 5 MG PO TABS: 5.0000 mg | ORAL_TABLET | Freq: Four times a day (QID) | ORAL | 0 refills | Status: DC | PRN

## 2020-01-17 MED ORDER — GABAPENTIN 100 MG PO CAPS: 100.0000 mg | ORAL_CAPSULE | Freq: Once | ORAL | Status: DC

## 2020-01-17 MED ORDER — AMOXICILLIN-POT CLAVULANATE 500-125 MG PO TABS
1.0000 | ORAL_TABLET | Freq: Every day | ORAL | 0 refills | Status: AC
Start: 2020-01-17 — End: 2020-01-20

## 2020-01-17 MED ORDER — DOXYCYCLINE MONOHYDRATE 100 MG PO TABS: 100.0000 mg | ORAL_TABLET | Freq: Two times a day (BID) | ORAL | 0 refills | Status: AC

## 2020-01-17 MED FILL — DOXYCYCLINE HYCLATE 100 MG: 100 | 3 days supply | Qty: 6 | Fill #0

## 2020-01-17 MED FILL — AMOX-CLAV 500-125 MG TABLET: 500-125 | 3 days supply | Qty: 3 | Fill #0

## 2020-01-17 MED FILL — LABETALOL HCL 100 MG TABLET: 100 | 30 days supply | Qty: 60 | Fill #0

## 2020-01-17 MED FILL — oxyCODONE HCL 5 MG TABS: 5 | 2 days supply | Qty: 5 | Fill #0

## 2020-01-17 NOTE — Progress Notes (Signed)
Renal Navigator faxed d/c summary and Renal note to [facility] to provide continuity of care.  Alphonzo Cruise, [place] Renal Navigator [phone]

## 2020-01-17 NOTE — Progress Notes (Signed)
[name] Progress Note    Assessment/ Plan:   1. ESRD on hemodialysis  1. Monday Wednesday Friday ([place]). RUE AVF  Outpatient orders: 4 hours, revaclear300, BFR 400/dfr 600. 3k, 2.5cal, 138na, 35bicarb. No heparin.  2. HD on MWF schedule: 3K, 4h, 2-3L UF, no heparin, AVF-- will plan on HD tomorrow-  Will do here if still here-  Watching chart for decision regarding discharge 2. Anemia of chronic kidney disease  hgb dropping  receives epogen 4k units outpatient- have not given here yet-  Have ordered dose 3. Secondary hyperparathyroidism; phos high, she cannot recall being on a binder- no action for now.  continue hectorol 3.5mcg w/ treatments 4. 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 Now no augmentin/doxy--- WBC appears to be increasing ? Per primary team 5. HTN/volume status, bp at goa 1. decrease post HD weights as able, BP ok- no scheduled meds   [name] A [facility]  Subjective:   NAE- HD completed yesterday- removed 3000- tolerated well.  Some controversy regarding d/c.  Some think she needs SNF-  She and daughter are not sure    Objective:   BP 129/60 (BP Location: Left Arm)   Pulse 83   Temp 99.3 F (37.4 C) (Oral)   Resp 14   Ht 5\' 8"  (1.727 m)   Wt 76.2 kg   SpO2 95%   BMI 25.54 kg/m   Intake/Output Summary (Last 24 hours) at 01/17/2020 1050 Last data filed at 01/17/2020 0900 Gross per 24 hour  Intake 370 ml  Output 3000 ml  Net -2630 ml   Weight change: -3 kg  Physical Exam: Gen: NAD in bed, pleasant CVS: Regular rate, S1-S2 Resp: Clear to auscultation bilaterally, normal work of breathing, no wheezes/rhonchi/rales/crackles, bilateral chest expansion Abd: Soft, nontender/nondistended Ext: Right upper extremity AVF with good bruit and thrill, right foot bandaged clean dressing Neuro: No focal deficits,  aaox3   Imaging: No results found.  Labs: BMET Recent Labs  Lab 01/11/20 0428 01/13/20 1531 01/16/20 0650  NA 132* 136 133*  K 3.9 4.4 4.9  CL 92* 96* 92*  CO2 24  --  25  GLUCOSE 226* 105* 125*  BUN 41* 40* 54*  CREATININE 7.32* 8.70* 8.64*  CALCIUM 8.0*  --  8.1*  PHOS  --   --  6.6*   CBC Recent Labs  Lab 01/11/20 1400 01/13/20 1531 01/16/20 0650  WBC 11.0*  --  17.4*  HGB 10.7* 13.9 9.2*  HCT 31.0* 41.0 27.1*  MCV 90.1  --  90.6  PLT 168  --  161    Medications:    . amoxicillin-clavulanate  1 tablet Oral q1800  . aspirin  162 mg Oral Daily  . Chlorhexidine Gluconate Cloth  6 each Topical Q0600  . cyanocobalamin  1,000 mcg Intramuscular Q30 days  . darbepoetin (ARANESP) injection - DIALYSIS  60 mcg Intravenous Q Mon-HD  . docusate sodium  100 mg Oral BID  . doxercalciferol  3 mcg Intravenous Q M,W,F-HD  . doxycycline  100 mg Oral Q12H  . gabapentin  300 mg Oral Daily  . heparin injection (subcutaneous)  5,000 Units Subcutaneous Q8H  . insulin aspart  0-6 Units Subcutaneous TID WC  . insulin NPH Human  7 Units Subcutaneous BID AC & HS  . labetalol  100 mg Oral BID  . levETIRAcetam  250 mg Oral BID  .  multivitamin  1 tablet Oral QHS

## 2020-01-17 NOTE — TOC Transition Note (Signed)
Transition of Care [facility]) - CM/SW Discharge Note   Patient Details  Name: [name] MRN: [id number] Date of Birth: 12/14/1940  [facility]) CM/SW Contact:  Alexander Mt, LCSW Phone Number: 01/17/2020, 3:47 PM   Clinical Narrative:    Pt family elected d/c home. CSW called and spoke again with [place]. She has picked up pt, at this time declining wheelchair as unsure if it will fit in her car. CSW made appointment for pt with PCP Dr. Berdine Addison on 8/10 at 3pm. Daughter aware and sent this information in a text at her request along with MD number if she needs to reschedule.   CSW called and alerted [name] that pt has discharged home. They will provide HHPT.   Final next level of care: [place] Barriers to Discharge: Barriers Resolved   Patient Goals and CMS Choice Patient states their goals for this hospitalization and ongoing recovery are:: to return to home CMS Medicare.gov Compare Post Acute Care list provided to:: Patient Choice offered to / list presented to : Patient  Discharge Placement Patient to be transferred to facility by: personal vehicle home Name of family member notified: pt [name] Patient and family notified of of transfer: 01/17/20  Discharge Plan and Services Discharge Planning Services: CM Consult Post Acute Care Choice: Home Health          DME Arranged: N/A DME Agency: NA HH Arranged: PT [place] Agency: [name] Date [place]: 01/17/20 Time [place]: [id number] Representative spoke with at [place]: [name]   Readmission Risk Interventions Readmission Risk Prevention Plan 01/17/2020  Transportation Screening Complete  PCP or Specialist Appt within 5-7 Days Not Complete  Not Complete comments pending disposition  Home Care Screening Complete  Medication Review (RN CM) Referral to Pharmacy  Some recent data might be hidden

## 2020-01-17 NOTE — Discharge Summary (Signed)
Physician Discharge Summary  [name] PJA:[id number] DOB: 01/14/1941 DOA: 01/05/2020  PCP: [name], MD  Admit date: 01/05/2020 Discharge date: 01/17/2020  Admitted From: Home Disposition:  Home  Recommendations for Outpatient Follow-up:  1. Follow up with PCP in 1-2 weeks 2. Follow up with Orthopedic Surgery as scheduled 3. Follow up with scheduled HD on MWF  NCCSR reviewed. No recent narcotics prescribed. Very limited quantity of oxycodone will be prescribed for post-op pain  Home Health:PT   Discharge Condition:Improved CODE STATUS:Full Diet recommendation: Diabetic, renal   Brief/Interim Summary: 79 y.o.femalewith medical history significant forESRD on HD MWF, IDT2DM, history of CVA,seizure disorder not taking AEDs,HTN, who presents to [facility] ED for evaluation of right foot cellulitis. Follows with her podiatrist regularly for management of an ulcer on the plantar surface of the right foot at the base of the first MTP. She had been wearing a boot for several weeks and noticed some swelling in her right leg since then. She went to see her podiatrist on 01/05/2020 who felt she was developing cellulitis of the right foot. She was subsequently sent to the ED for further evaluation and management.  In the ED, Temp 99.9 Fahrenheit,WBC 23.6K, hemoglobin 12.4K, platelets 83,000 (171,000 previously on 05/15/2019), lactic acid 1.4, sodium 136, potassium 4.6, bicarb 27, BUN 26, creatinine 6.66.  SARS-CoV-2 PCR negative. Blood cultures x2 negative to date.  Right foot x-ray shows soft tissue swelling with ulcer/wound along plantar aspect of the foot at the level of the first MTP. No definite acute osseous abnormality seen. MRIright foot negative for osteomyelitis. Poor dorsalis pedis pulses. [place] ABI, B/L + PAD.  Daughter in the room requests referral to a different podiatrist. TOC consulted to [place] this request.  Hospital course complicated by possible  seizure activity,the morning of 01/09/2020,for which an EEG was done and neurology was consulted. Discussed with neurology, Dr, Hortense Ramal. She recommended continue home dose Keppra 250 mg twice daily and follow-up with neurology outpatient ifMRI unremarkable for any acuteintracranial findings.No reported recurrence of seizure-like activities.  Discharge Diagnoses:  Principal Problem:   Cellulitis of right foot Active Problems:   Diabetes mellitus, type 2 (HCC)   Leukocytosis   Hypertension associated with diabetes ([place])   ESRD (end stage renal disease) on dialysis ([place])   History of CVA (cerebrovascular accident)   Thrombocytopenia ([place])   Diabetic ulcer of right foot associated with diabetes mellitus due to underlying condition ([place])   Gangrene of toe of right foot ([place])   Sepsis, improving, secondary to cellulitis of right foot associated with diabetic ulcer, osteomyelitis ruled out. Presented with leukocytosis 20K, tachycardia, tachypnea, cellulitis distal dorsal right foot on Xray and MRI. Has plantar diabetic ulcer at base of first MTP without obvious drainage.  ESR 42 and CRP 20 No evidence of DVT on bilateral Doppler ultrasound No evidence of osteomyelitis on MRI right foot. Leukocytosis has improved Initially received Rocephin, then switched toIV Zosyn and IV vancomycin Started Augmentin on 01/10/2020 On 7/29, noted to have purulent drainage around foot wound Consulted orthopedic surgery, appreciate input.  Patient now status post first ray amputation Initial PT/OT recs for SNF. Pt's family prefers pt to go home. HH arranged through [facility] Recommend follow up with orthopedic surgery in 2 weeks as scheduled  Seizure disorder Generalized convulsions7/27 AM. Per her daughter ceased to take her AED Keppra about 1 year ago, did not return to neuro for refills. EEG ordered, no epileptiform activity MRI brain ordered, findings of fairly extensive post ischemic gliosis  in the  superior frontal and parietal lobes, much of which may be the sequela of bilateral infarct seen in 2015 Continue Keppra to 50 mg twice daily as recommended by neurology Dr. [place] Crane discussed withneurology, Dr, Hortense Ramal. She recommended continue home dose Keppra 250 mg twice daily and follow-up with neurology outpatient ifMRI unremarkable for any acuteintracranial findings.We will then discharge from a neurological standpoint.No reported recurrence of seizure activities. Continue seizure precautions. EEG recently reviewed, no evidence of seizures.  Patient is to f/u with neuro as outpt when pt is discharged  Peripheral arterial disease seen on bilateral lower extremity vascular ultrasound ABI done on 01/08/20, showing mild right lower extremity arterial disease, moderate left lower extremity arterial disease. Continue aspirin,140m daily Possibly not on statin due to ESRD. Will need to follow-up withacardiologist after discharge  History of CVA: Continue home aspirin 162 mg daily, monitor platelet counts as above. Not currently on statin therapy possibly due to ESRD.  Acute hyperkalemia, resolved with hemodialysis, in the setting of ESRD Electrolytes addressed by hemodialysis and potassium normalized Continue with dialysis as patient tolerates  Mild hypovolemic hyponatremia Volume status managed with hemodialysis Sodium had improved since undergoing dialysis  Bilateral lower extremity edema, resolved post hemodialysis, secondary to volume overload on exam Presented with 1+ pitting edema in lower extremities bilaterally, has improved after hemodialysis. No evidence of DVT on bilateral Doppler ultrasound as noted  ESRD on HD MWF: Seen by Dr. SCandiss Norse appreciate nephrology's assistance. Continue hemodialysis as patient tolerates  Thrombocytopenia secondary to acute illness, improving: No evidence of bleeding Plts now within normal limits  Insulin-dependent type 2  diabetes,with hypoglycemia: A1C 7.2 on 01/05/20 Hypoglycemic AM of 7/26 Now on 7 units twice daily Lantus Cont to titrate insulin with goal of euglycemia  Hypertension: Blood pressure is at goal Continue home labetalol.  Hydralazine as needed. Continue to monitor blood pressure for now  Vitamin B12 deficiency Continuemonthly B12 IM injection after hemodialysis 01/09/2020.  Acute toxic metabolic encephalopathy Recently noted to have increased confusion, encephalopathy. Likely secondary to sedating medications, most notably IV Dilaudid that patient received for foot pain Recommend continue minimizing sedating medications if possible Currently seems at baseline state  Discharge Instructions   Allergies as of 01/17/2020      Reactions   Ambien [zolpidem] Other (See Comments)   Hallucinations    Reglan [metoclopramide] Other (See Comments)   "makes me crazy"      Medication List    STOP taking these medications   ibuprofen 200 MG tablet Commonly known as: ADVIL     TAKE these medications   albuterol 108 (90 Base) MCG/ACT inhaler Commonly known as: VENTOLIN HFA Inhale 2 puffs into the lungs every 6 (six) hours as needed for wheezing or shortness of breath.   amoxicillin-clavulanate 500-125 MG tablet Commonly known as: Augmentin Take 1 tablet (500 mg total) by mouth daily at 6 PM for 3 days. End 8/2   aspirin 81 MG chewable tablet Chew 2 tablets (162 mg total) by mouth daily.   BD Insulin Syringe U/F 31G X 5/16" 0.3 ML Misc Generic drug: Insulin Syringe-Needle U-100 2 (two) times daily. as directed   cyanocobalamin 1000 MCG/ML injection Commonly known as: (VITAMIN B-12) Inject 1 mL into the muscle every 30 (thirty) days.   doxycycline 100 MG tablet Commonly known as: ADOXA Take 1 tablet (100 mg total) by mouth 2 (two) times daily for 3 days. End 8/2   gabapentin 300 MG capsule Commonly known as: NEURONTIN Take 300 mg  by mouth daily.   insulin NPH Human  100 UNIT/ML injection Commonly known as: NOVOLIN N Inject 0.07 mLs (7 Units total) into the skin 2 (two) times daily before a meal. What changed: how much to take   labetalol 100 MG tablet Commonly known as: NORMODYNE Take 1 tablet (100 mg total) by mouth 2 (two) times daily. What changed:   medication strength  how much to take   levETIRAcetam 250 MG tablet Commonly known as: KEPPRA Take 250 mg by mouth 2 (two) times daily.   lidocaine-prilocaine cream Commonly known as: EMLA Apply 1 application topically every Monday, Wednesday, and Friday.   multivitamin Tabs tablet Take 1 tablet by mouth at bedtime.   oxyCODONE 5 MG immediate release tablet Commonly known as: Oxy IR/ROXICODONE Take 1 tablet (5 mg total) by mouth every 6 (six) hours as needed for severe pain.   sevelamer carbonate 800 MG tablet Commonly known as: RENVELA Take 800 mg by mouth 3 (three) times daily with meals.       Follow-up Information    [facility] Provider Line Follow up.   Why: This number can be contacted for a list of podiatrists within the [facility] system. This is a free physician referral service. Contact information: [phone]       [name] Follow up.   Specialty: Home Health Services Contact information: [place] [address] [place], [place], [place].   Specialty: Orthopedic Surgery Contact information: [place] [address] [phone]        [name], MD. Schedule an appointment as soon as possible for a visit in 2 week(s).   Specialty: Family Medicine Contact information: [address] [place] [address] [phone]              Allergies  Allergen Reactions  . Ambien [Zolpidem] Other (See Comments)    Hallucinations   . Reglan [Metoclopramide] Other (See Comments)    "makes me crazy"    Consultations:  Nephrology  Neurology  Orthopedic  Surgery  Procedures/Studies: MR BRAIN WO CONTRAST  Result Date: 01/10/2020 CLINICAL DATA:  79 year old female with recurrent seizure like activity. Started on Keppra. EXAM: MRI HEAD WITHOUT CONTRAST TECHNIQUE: Multiplanar, multiecho pulse sequences of the brain and surrounding structures were obtained without intravenous contrast. COMPARISON:  Brain MRI 08/20/2013.  Head CT 08/07/2019. FINDINGS: Brain: Generalized cerebral volume loss since 2015 with multifocal areas of confluent parenchymal T2 and FLAIR hyperintensity over the cerebral convexities likely post ischemic gliosis - including some probably related to the scattered infarcts seen in 2015. Mostly white matter involvement although there is an area of cortical encephalomalacia in the left parietal lobe on series 11, image 19. SWI demonstrates of few subtle scattered chronic microhemorrhages superimposed, such as seen on series 16, image 30. There are are also chronic microhemorrhages in the left caudate and occasionally the brainstem. Small chronic bilateral cerebellar infarcts were acute in 2015. No restricted diffusion to suggest acute infarction. No midline shift, mass effect, evidence of mass lesion, ventriculomegaly, extra-axial collection or acute intracranial hemorrhage. Cervicomedullary junction and pituitary are within normal limits. Hippocampal formations and mesial temporal lobe structures are within normal limits on thin slice imaging. Vascular: Major intracranial vascular flow voids are stable since 2015. Skull and upper cervical spine: Stable, negative visible cervical spine and bone marrow signal (hyperostosis of the calvarium). Sinuses/Orbits: Stable and negative. Other: Mastoids are clear. Visible internal  auditory structures appear normal. Negative scalp and face soft tissues. IMPRESSION: 1.  No acute intracranial abnormality. 2. Fairly extensive post ischemic gliosis in the superior frontal and parietal lobes, much of which may be the  sequelae of the bilateral infarcts seen in 2015 which also affected the cerebellum. Scattered chronic microhemorrhages. Electronically Signed   By: Genevie Ann M.D.   On: 01/10/2020 16:16   MR FOOT RIGHT WO CONTRAST  Result Date: 01/06/2020 CLINICAL DATA:  Right foot cellulitis.  Diabetes. EXAM: MRI OF THE RIGHT FOREFOOT WITHOUT CONTRAST TECHNIQUE: Multiplanar, multisequence MR imaging of the right forefoot was performed. No intravenous contrast was administered. COMPARISON:  None. FINDINGS: Bones/Joint/Cartilage No findings of osteomyelitis. Incidental bifid medial sesamoid. No significant abnormal signal in the sesamoids. Ligaments Lisfranc ligament intact. Muscles and Tendons Diffuse regional muscular edema, potentially neurogenic or from low-grade myositis. Soft tissues Plantar ulceration below the first MTP joint. No drainable abscess. Notable subcutaneous edema especially along the dorsum of the foot. This edema tracks into the toes. IMPRESSION: 1. Considerable subcutaneous edema in the forefoot especially along the dorsum of the foot, suggesting cellulitis. No abscess identified. 2. No findings of osteomyelitis. 3. Plantar ulceration below the first MTP joint. 4. Diffuse regional muscular edema, potentially neurogenic or from low-grade myositis. Electronically Signed   By: Van Clines M.D.   On: 01/06/2020 12:15   DG Chest Port 1 View  Result Date: 01/05/2020 CLINICAL DATA:  Sepsis, wound infection EXAM: PORTABLE CHEST 1 VIEW COMPARISON:  Radiograph 02/19/2018 FINDINGS: Stable mild hyperinflation with some coarsened interstitial changes. No consolidation, features of edema, pneumothorax, or effusion. Cardiomediastinal contours are unremarkable for the portable technique. Pulmonary vascularity normally distributed. Loop recorder projects over the left heart border. Vascular stent noted in the right axillary region. Telemetry leads overlie the chest. No acute osseous or soft tissue abnormality.  Degenerative changes are present in the imaged spine and shoulders. IMPRESSION: 1. No acute cardiopulmonary disease. 2. Stable mild hyperinflation and coarsened interstitial changes. Electronically Signed   By: Lovena Le M.D.   On: 01/05/2020 18:57   DG Foot Complete Right  Result Date: 01/05/2020 CLINICAL DATA:  Wound infection diabetic patient EXAM: RIGHT FOOT COMPLETE - 3+ VIEW COMPARISON:  10/13/2019 FINDINGS: Bones appear demineralized. There are vascular calcifications. There is prominent soft tissue swelling. Ulcer or wound along the plantar aspect of the foot at the level of the first MTP. No definite periostitis or cortical bone destruction in the region. Moderate plantar calcaneal spur. IMPRESSION: Soft tissue swelling with ulcer or wound along the plantar aspect of the foot at the level of the first MTP. No definite acute osseous abnormality is seen. Electronically Signed   By: Donavan Foil M.D.   On: 01/05/2020 15:59   EEG adult  Result Date: 01/09/2020 Lora Havens, MD     01/09/2020 11:14 AM Patient Name: [name] MRN: [id number] Epilepsy Attending: Lora Havens Referring Physician/Provider: Dr Irene Pap Date: 01/09/2020 Duration: 25.28 mins Patient history: 79 Yo F with sepsis.  EEG to evaluate for seizures. Level of alertness: Awake,asleep AEDs during EEG study: None Technical aspects: This EEG study was done with scalp electrodes positioned according to the 10-20 International system of electrode placement. Electrical activity was acquired at a sampling rate of 500Hz and reviewed with a high frequency filter of 70Hz and a low frequency filter of 1Hz. EEG data were recorded continuously and digitally stored. Description: The posterior dominant rhythm consists of 9 Hz activity of moderate voltage (  25-35 uV) seen predominantly in posterior head regions, symmetric and reactive to eye opening and eye closing. Sleep was characterized by vertex waves, sleep spindles (12 to 14  Hz), maximal frontocentral region. Hyperventilation and photic stimulation were not performed.   IMPRESSION: This study is within normal limits. No seizures or epileptiform discharges were seen throughout the recording. Priyanka O Yadav   VAS [place] ABI WITH/WO TBI  Result Date: 01/08/2020 LOWER EXTREMITY DOPPLER STUDY Indications: Ulceration, and cellulitis of right foot. High Risk Factors: Hypertension, Diabetes, prior CVA. Other Factors: ESRD, on dialysis.  Comparison Study: No prior study on file Performing Technologist: Sharion Dove RVS  Examination Guidelines: A complete evaluation includes at minimum, Doppler waveform signals and systolic blood pressure reading at the level of bilateral brachial, anterior tibial, and posterior tibial arteries, when vessel segments are accessible. Bilateral testing is considered an integral part of a complete examination. Photoelectric Plethysmograph (PPG) waveforms and toe systolic pressure readings are included as required and additional duplex testing as needed. Limited examinations for reoccurring indications may be performed as noted.  ABI Findings: +---------+------------------+-----+-----------+--------------------------+ Right    Rt Pressure (mmHg)IndexWaveform   Comment                    +---------+------------------+-----+-----------+--------------------------+ Brachial                        multiphasicdialysis access/restricted +---------+------------------+-----+-----------+--------------------------+ PTA      120               0.88 biphasic                              +---------+------------------+-----+-----------+--------------------------+ DP       125               0.92 biphasic                              +---------+------------------+-----+-----------+--------------------------+ Great Toe87                0.64                                       +---------+------------------+-----+-----------+--------------------------+  +---------+------------------+-----+---------+-------+ Left     Lt Pressure (mmHg)IndexWaveform Comment +---------+------------------+-----+---------+-------+ Brachial 136                    triphasic        +---------+------------------+-----+---------+-------+ PTA      93                0.68 biphasic         +---------+------------------+-----+---------+-------+ DP       90                0.66 biphasic         +---------+------------------+-----+---------+-------+ Great Toe65                0.48                  +---------+------------------+-----+---------+-------+ +-------+-----------+-----------+------------+------------+ ABI/TBIToday's ABIToday's TBIPrevious ABIPrevious TBI +-------+-----------+-----------+------------+------------+ Right  0.92       0.64                                +-------+-----------+-----------+------------+------------+  Left   0.66       0.48                                +-------+-----------+-----------+------------+------------+  Summary: Right: Resting right ankle-brachial index indicates mild right lower extremity arterial disease. The right toe-brachial index is abnormal. Left: Resting left ankle-brachial index indicates moderate left lower extremity arterial disease. The left toe-brachial index is abnormal.  *See table(s) above for measurements and observations.  Electronically signed by Monica Martinez MD on 01/08/2020 at 3:44:21 PM.    Final    VAS [place] LOWER EXTREMITY VENOUS (DVT)  Result Date: 01/06/2020  Lower Venous DVTStudy Indications: Edema.  Comparison Study: no prior Performing Technologist: Abram Sander RVS  Examination Guidelines: A complete evaluation includes B-mode imaging, spectral Doppler, color Doppler, and power Doppler as needed of all accessible portions of each vessel. Bilateral testing is considered an integral part of a complete examination. Limited examinations for reoccurring indications may be  performed as noted. The reflux portion of the exam is performed with the patient in reverse Trendelenburg.  +---------+---------------+---------+-----------+----------+--------------+ RIGHT    CompressibilityPhasicitySpontaneityPropertiesThrombus Aging +---------+---------------+---------+-----------+----------+--------------+ CFV      Full           Yes      Yes                                 +---------+---------------+---------+-----------+----------+--------------+ SFJ      Full                                                        +---------+---------------+---------+-----------+----------+--------------+ FV Prox  Full                                                        +---------+---------------+---------+-----------+----------+--------------+ FV Mid   Full                                                        +---------+---------------+---------+-----------+----------+--------------+ FV DistalFull                                                        +---------+---------------+---------+-----------+----------+--------------+ PFV      Full                                                        +---------+---------------+---------+-----------+----------+--------------+ POP      Full           Yes      Yes                                 +---------+---------------+---------+-----------+----------+--------------+  PTV      Full                                                        +---------+---------------+---------+-----------+----------+--------------+ PERO     Full                                                        +---------+---------------+---------+-----------+----------+--------------+   +----+---------------+---------+-----------+----------+--------------+ LEFTCompressibilityPhasicitySpontaneityPropertiesThrombus Aging +----+---------------+---------+-----------+----------+--------------+ CFV Full           Yes       Yes                                 +----+---------------+---------+-----------+----------+--------------+     Summary: RIGHT: - There is no evidence of deep vein thrombosis in the lower extremity.  - No cystic structure found in the popliteal fossa.  LEFT: - No evidence of common femoral vein obstruction.  *See table(s) above for measurements and observations. Electronically signed by Monica Martinez MD on 01/06/2020 at 12:47:38 PM.    Final      Subjective: Eager to go home  Discharge Exam: Vitals:   01/16/20 2213 01/17/20 0435  BP: 132/66 129/60  Pulse: 78 83  Resp:    Temp:  99.3 F (37.4 C)  SpO2:  95%   Vitals:   01/16/20 2056 01/16/20 2213 01/17/20 0435 01/17/20 0436  BP: (!) 112/49 132/66 129/60   Pulse: 76 78 83   Resp: 14     Temp: 98.4 F (36.9 C)  99.3 F (37.4 C)   TempSrc: Oral  Oral   SpO2: 98%  95%   Weight:    76.2 kg  Height:        General: Pt is alert, awake, not in acute distress Cardiovascular: RRR, S1/S2 +, no rubs, no gallops Respiratory: CTA bilaterally, no wheezing, no rhonchi Abdominal: Soft, NT, ND, bowel sounds + Extremities: no edema, no cyanosis   The results of significant diagnostics from this hospitalization (including imaging, microbiology, ancillary and laboratory) are listed below for reference.     Microbiology: Recent Results (from the past 240 hour(s))  MRSA PCR Screening     Status: None   Collection Time: 01/08/20  5:48 AM   Specimen: Nasal Mucosa; Nasopharyngeal  Result Value Ref Range Status   MRSA by PCR NEGATIVE NEGATIVE Final    Comment:        The GeneXpert MRSA Assay (FDA approved for NASAL specimens only), is one component of a comprehensive MRSA colonization surveillance program. It is not intended to diagnose MRSA infection nor to guide or monitor treatment for MRSA infections. Performed at [facility], [place] [address], [place],  [address]      Labs: BNP (last 3 results) No results  for input(s): BNP in the last 8760 hours. Basic Metabolic Panel: Recent Labs  Lab 01/11/20 0428 01/13/20 1531 01/16/20 0650  NA 132* 136 133*  K 3.9 4.4 4.9  CL 92* 96* 92*  CO2 24  --  25  GLUCOSE 226* 105* 125*  BUN 41* 40* 54*  CREATININE 7.32* 8.70* 8.64*  CALCIUM 8.0*  --  8.1*  PHOS  --   --  6.6*   Liver Function Tests: Recent Labs  Lab 01/16/20 0650  ALBUMIN 2.5*   No results for input(s): LIPASE, AMYLASE in the last 168 hours. No results for input(s): AMMONIA in the last 168 hours. CBC: Recent Labs  Lab 01/11/20 1400 01/13/20 1531 01/16/20 0650  WBC 11.0*  --  17.4*  HGB 10.7* 13.9 9.2*  HCT 31.0* 41.0 27.1*  MCV 90.1  --  90.6  PLT 168  --  161   Cardiac Enzymes: No results for input(s): CKTOTAL, CKMB, CKMBINDEX, TROPONINI in the last 168 hours. BNP: Invalid input(s): POCBNP CBG: Recent Labs  Lab 01/16/20 1151 01/16/20 1716 01/16/20 2146 01/17/20 0757 01/17/20 1246  GLUCAP 91 212* 212* 227*  227* 216*   D-Dimer No results for input(s): DDIMER in the last 72 hours. Hgb A1c No results for input(s): HGBA1C in the last 72 hours. Lipid Profile No results for input(s): CHOL, HDL, LDLCALC, TRIG, CHOLHDL, LDLDIRECT in the last 72 hours. Thyroid function studies No results for input(s): TSH, T4TOTAL, T3FREE, THYROIDAB in the last 72 hours.  Invalid input(s): FREET3 Anemia work up No results for input(s): VITAMINB12, FOLATE, FERRITIN, TIBC, IRON, RETICCTPCT in the last 72 hours. Urinalysis    Component Value Date/Time   COLORURINE YELLOW 10/15/2017 2244   APPEARANCEUR HAZY (A) 10/15/2017 2244   LABSPEC 1.015 10/15/2017 2244   PHURINE 7.0 10/15/2017 2244   GLUCOSEU >=500 (A) 10/15/2017 2244   HGBUR SMALL (A) 10/15/2017 2244   BILIRUBINUR NEGATIVE 10/15/2017 2244   KETONESUR NEGATIVE 10/15/2017 2244   PROTEINUR 100 (A) 10/15/2017 2244   UROBILINOGEN 0.2 11/19/2014 0909   NITRITE NEGATIVE 10/15/2017 2244   LEUKOCYTESUR MODERATE (A) 10/15/2017  2244   Sepsis Labs Invalid input(s): PROCALCITONIN,  WBC,  LACTICIDVEN Microbiology Recent Results (from the past 240 hour(s))  MRSA PCR Screening     Status: None   Collection Time: 01/08/20  5:48 AM   Specimen: Nasal Mucosa; Nasopharyngeal  Result Value Ref Range Status   MRSA by PCR NEGATIVE NEGATIVE Final    Comment:        The GeneXpert MRSA Assay (FDA approved for NASAL specimens only), is one component of a comprehensive MRSA colonization surveillance program. It is not intended to diagnose MRSA infection nor to guide or monitor treatment for MRSA infections. Performed at [facility], [place] [address], [place], [place] [address]    Time spent: 38mn  SIGNED:   SMarylu Lund MD  [facility] 01/17/2020, 1:24 PM  If 7PM-7AM, please contact night-coverage

## 2020-01-17 NOTE — TOC Progression Note (Addendum)
Transition of Care [facility]) - Progression Note    Patient Details  Name: RAYMIE TRANI MRN: [id number] Date of Birth: 04/01/1941  Transition of Care [facility]) CM/SW [place], [place] Phone Number: 01/17/2020, 9:17 AM  Clinical Narrative:    CSW noted new recommendation for SNF placement. Called and spoke with pt daughter Lilliauna Van. Introduced self, role, reason for call. Pt daughter aware of new recommendations for SNF, she is going to go by hospital today and speak with pt. At this time they think that they would like to still bring pt home, but this writer provided my contact information and pt daughter will be back in touch with me regarding preferred disposition.    Expected Discharge Plan: [place] Barriers to Discharge: Continued Medical Work up  Expected Discharge Plan and Services Expected Discharge Plan: [place]   Discharge Planning Services: CM Consult Post Acute Care Choice: [place] arrangements for the past 2 months: Single Family Home             DME Arranged: N/A DME Agency: NA HH Arranged: PT  Readmission Risk Interventions Readmission Risk Prevention Plan 01/17/2020  Transportation Screening Complete  PCP or Specialist Appt within 5-7 Days Not Complete  Not Complete comments pending disposition  Home Care Screening Complete  Medication Review (RN CM) Referral to Pharmacy  Some recent data might be hidden

## 2020-01-18 DIAGNOSIS — N186 End stage renal disease: Secondary | ICD-10-CM | POA: Diagnosis not present

## 2020-01-18 DIAGNOSIS — Z992 Dependence on renal dialysis: Secondary | ICD-10-CM | POA: Diagnosis not present

## 2020-01-19 ENCOUNTER — Other Ambulatory Visit: Payer: Self-pay | Admitting: Physician Assistant

## 2020-01-19 ENCOUNTER — Telehealth: Payer: Self-pay | Admitting: Radiology

## 2020-01-19 ENCOUNTER — Telehealth: Payer: Self-pay | Admitting: Physician Assistant

## 2020-01-19 DIAGNOSIS — Z89431 Acquired absence of right foot: Secondary | ICD-10-CM | POA: Diagnosis not present

## 2020-01-19 DIAGNOSIS — Z8631 Personal history of diabetic foot ulcer: Secondary | ICD-10-CM | POA: Diagnosis not present

## 2020-01-19 DIAGNOSIS — I120 Hypertensive chronic kidney disease with stage 5 chronic kidney disease or end stage renal disease: Secondary | ICD-10-CM | POA: Diagnosis not present

## 2020-01-19 DIAGNOSIS — R262 Difficulty in walking, not elsewhere classified: Secondary | ICD-10-CM | POA: Diagnosis not present

## 2020-01-19 DIAGNOSIS — Z794 Long term (current) use of insulin: Secondary | ICD-10-CM | POA: Diagnosis not present

## 2020-01-19 DIAGNOSIS — Z4781 Encounter for orthopedic aftercare following surgical amputation: Secondary | ICD-10-CM | POA: Diagnosis not present

## 2020-01-19 DIAGNOSIS — I70203 Unspecified atherosclerosis of native arteries of extremities, bilateral legs: Secondary | ICD-10-CM | POA: Diagnosis not present

## 2020-01-19 DIAGNOSIS — N186 End stage renal disease: Secondary | ICD-10-CM | POA: Diagnosis not present

## 2020-01-19 DIAGNOSIS — E1122 Type 2 diabetes mellitus with diabetic chronic kidney disease: Secondary | ICD-10-CM | POA: Diagnosis not present

## 2020-01-19 MED ORDER — OXYCODONE HCL 5 MG PO TABS: 5.0000 mg | ORAL_TABLET | ORAL | 0 refills | Status: DC | PRN

## 2020-01-19 MED ORDER — OXYCODONE HCL 5 MG PO CAPS: 5.0000 mg | ORAL_CAPSULE | ORAL | 0 refills | Status: DC | PRN

## 2020-01-19 NOTE — Telephone Encounter (Signed)
PT, with [facility] called on behalf of patient.  [place]ith foot 1st ray amputation with Dr. Sharol Given 01/13/20. States patient was sent home with very little pain medication and is completely out and is in a lot of pain.  Refill needed.  PT CB# [phone]  Or   Patients daughter, Bary Leriche [phone]

## 2020-01-19 NOTE — Telephone Encounter (Signed)
Patient daughter [name] called advised the pharmacy said they do not have the capsules only have the Tabs. [name] said the Rx would need to be called back in for the Tablets. The number to contact [name] is [phone]

## 2020-01-19 NOTE — Telephone Encounter (Signed)
Please see below.

## 2020-01-19 NOTE — Telephone Encounter (Signed)
Please see message below. 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

## 2020-01-20 ENCOUNTER — Telehealth: Payer: Self-pay | Admitting: Orthopedic Surgery

## 2020-01-20 DIAGNOSIS — N186 End stage renal disease: Secondary | ICD-10-CM | POA: Diagnosis not present

## 2020-01-20 DIAGNOSIS — Z992 Dependence on renal dialysis: Secondary | ICD-10-CM | POA: Diagnosis not present

## 2020-01-20 NOTE — Telephone Encounter (Signed)
Daughter aware of this

## 2020-01-20 NOTE — Telephone Encounter (Signed)
Pts [name] would like to know if the pt should still be taking her antibiotics; would like a CB with the answer  [phone]

## 2020-01-20 NOTE — Telephone Encounter (Signed)
Please advise 

## 2020-01-20 NOTE — Telephone Encounter (Signed)
No she may discontinue

## 2020-01-23 DIAGNOSIS — Z992 Dependence on renal dialysis: Secondary | ICD-10-CM | POA: Diagnosis not present

## 2020-01-23 DIAGNOSIS — N2581 Secondary hyperparathyroidism of renal origin: Secondary | ICD-10-CM | POA: Diagnosis not present

## 2020-01-23 DIAGNOSIS — N186 End stage renal disease: Secondary | ICD-10-CM | POA: Diagnosis not present

## 2020-01-24 DIAGNOSIS — N186 End stage renal disease: Secondary | ICD-10-CM | POA: Diagnosis not present

## 2020-01-24 DIAGNOSIS — Z794 Long term (current) use of insulin: Secondary | ICD-10-CM | POA: Diagnosis not present

## 2020-01-24 DIAGNOSIS — Z89431 Acquired absence of right foot: Secondary | ICD-10-CM | POA: Diagnosis not present

## 2020-01-24 DIAGNOSIS — I70203 Unspecified atherosclerosis of native arteries of extremities, bilateral legs: Secondary | ICD-10-CM | POA: Diagnosis not present

## 2020-01-24 DIAGNOSIS — Z4781 Encounter for orthopedic aftercare following surgical amputation: Secondary | ICD-10-CM | POA: Diagnosis not present

## 2020-01-24 DIAGNOSIS — Z8631 Personal history of diabetic foot ulcer: Secondary | ICD-10-CM | POA: Diagnosis not present

## 2020-01-24 DIAGNOSIS — I120 Hypertensive chronic kidney disease with stage 5 chronic kidney disease or end stage renal disease: Secondary | ICD-10-CM | POA: Diagnosis not present

## 2020-01-24 DIAGNOSIS — E1122 Type 2 diabetes mellitus with diabetic chronic kidney disease: Secondary | ICD-10-CM | POA: Diagnosis not present

## 2020-01-24 DIAGNOSIS — R262 Difficulty in walking, not elsewhere classified: Secondary | ICD-10-CM | POA: Diagnosis not present

## 2020-01-25 DIAGNOSIS — Z992 Dependence on renal dialysis: Secondary | ICD-10-CM | POA: Diagnosis not present

## 2020-01-25 DIAGNOSIS — N186 End stage renal disease: Secondary | ICD-10-CM | POA: Diagnosis not present

## 2020-01-26 ENCOUNTER — Ambulatory Visit (INDEPENDENT_AMBULATORY_CARE_PROVIDER_SITE_OTHER): Payer: Medicare Other | Admitting: Physician Assistant

## 2020-01-26 ENCOUNTER — Encounter: Payer: Self-pay | Admitting: Orthopedic Surgery

## 2020-01-26 VITALS — Ht 68.0 in | Wt 167.0 lb

## 2020-01-26 DIAGNOSIS — I70203 Unspecified atherosclerosis of native arteries of extremities, bilateral legs: Secondary | ICD-10-CM | POA: Diagnosis not present

## 2020-01-26 DIAGNOSIS — Z89431 Acquired absence of right foot: Secondary | ICD-10-CM | POA: Diagnosis not present

## 2020-01-26 DIAGNOSIS — E1122 Type 2 diabetes mellitus with diabetic chronic kidney disease: Secondary | ICD-10-CM | POA: Diagnosis not present

## 2020-01-26 DIAGNOSIS — I120 Hypertensive chronic kidney disease with stage 5 chronic kidney disease or end stage renal disease: Secondary | ICD-10-CM | POA: Diagnosis not present

## 2020-01-26 DIAGNOSIS — L97411 Non-pressure chronic ulcer of right heel and midfoot limited to breakdown of skin: Secondary | ICD-10-CM

## 2020-01-26 DIAGNOSIS — R262 Difficulty in walking, not elsewhere classified: Secondary | ICD-10-CM | POA: Diagnosis not present

## 2020-01-26 DIAGNOSIS — E08621 Diabetes mellitus due to underlying condition with foot ulcer: Secondary | ICD-10-CM

## 2020-01-26 DIAGNOSIS — Z794 Long term (current) use of insulin: Secondary | ICD-10-CM | POA: Diagnosis not present

## 2020-01-26 DIAGNOSIS — Z8631 Personal history of diabetic foot ulcer: Secondary | ICD-10-CM | POA: Diagnosis not present

## 2020-01-26 DIAGNOSIS — Z4781 Encounter for orthopedic aftercare following surgical amputation: Secondary | ICD-10-CM | POA: Diagnosis not present

## 2020-01-26 DIAGNOSIS — N186 End stage renal disease: Secondary | ICD-10-CM | POA: Diagnosis not present

## 2020-01-26 NOTE — Progress Notes (Signed)
Office Visit Note   Patient: [name]           Date of Birth: 10/23/1940           MRN: [id number] Visit Date: 01/26/2020              Requested by: [name], [address] [place],  [place] [address] PCP: [name], MD  Chief Complaint  Patient presents with  . 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  She is at home with her daughter.  She is not complaining of any significant pain but just says it comes and goes  Assessment & Plan: Visit Diagnoses: No diagnosis found.  Plan: Patient will follow up in 1 week.  Dry dressing was applied today.  She may cleanse this area with mild soap and water emphasized the importance of minimizing weightbearing even on her heel and elevating her foot  Follow-Up Instructions: No follow-ups on file.   Ortho Exam  Patient is alert, oriented, no adenopathy, well-dressed, normal affect, normal respiratory effort. Focused examination of her right foot demonstrates well opposed wound edges.  No odor or surrounding cellulitis.  She does have some skin maceration on the proximal end of the wound.  There is no fluctuance minimally tender to palpation no sign of acute infection  Imaging: No results found. No images are attached to the encounter.  Labs: Lab Results  Component Value Date   HGBA1C 7.7 (H) 01/05/2020   HGBA1C 7.5 (H) 10/14/2017   HGBA1C 9.8 (H) 06/01/2015   ESRSEDRATE 42 (H) 01/06/2020   CRP 20.3 (H) 01/06/2020   REPTSTATUS 01/10/2020 FINAL 01/05/2020   CULT  01/05/2020    NO GROWTH 5 DAYS Performed at [facility], [place] [address], [place], [place] [address]      Lab Results  Component Value Date   ALBUMIN 2.5 (L) 01/16/2020   ALBUMIN 2.9 (L) 01/06/2020   ALBUMIN 2.8 (L) 01/06/2020    Lab Results  Component Value Date   MG 1.9 06/26/2013   MG 1.7  01/25/2009   No results found for: VD25OH  No results found for: PREALBUMIN CBC EXTENDED Latest Ref Rng & Units 01/16/2020 01/13/2020 01/11/2020  WBC 4.0 - 10.5 K/uL 17.4(H) - 11.0(H)  RBC 3.87 - 5.11 MIL/uL 2.99(L) - 3.44(L)  HGB 12.0 - 15.0 g/dL 9.2(L) 13.9 10.7(L)  HCT 36 - 46 % 27.1(L) 41.0 31.0(L)  PLT 150 - 400 K/uL 161 - 168  NEUTROABS 1.7 - 7.7 K/uL - - -  LYMPHSABS 0.7 - 4.0 K/uL - - -     Body mass index is 25.39 kg/m.  Orders:  No orders of the defined types were placed in this encounter.  No orders of the defined types were placed in this encounter.    Procedures: No procedures performed  Clinical Data: No additional findings.  ROS:  All other systems negative, except as noted in the HPI. Review of Systems  Objective: Vital Signs: Ht 5\' 8"  (1.727 m)   Wt 167 lb (75.8 kg)   BMI 25.39 kg/m   Specialty Comments:  No specialty comments available.  PMFS History: Patient Active Problem List   Diagnosis Date Noted  . Gangrene of toe of right foot ([place])   . Cellulitis of right foot  01/05/2020  . History of CVA (cerebrovascular accident) 01/05/2020  . Thrombocytopenia ([place]) 01/05/2020  . Diabetic ulcer of right foot associated with diabetes mellitus due to underlying condition ([place]) 01/05/2020  . History of colonic polyps 04/01/2018  . Acute bronchitis 10/15/2017  . DM type 2, goal A1c below 7   . HCAP (healthcare-associated pneumonia) 05/29/2015  . Hyperkalemia 05/29/2015  . Diabetes mellitus with complication ([place])   . ESRD (end stage renal disease) on dialysis ([place])   . Encounter for adequacy testing for hemodialysis ([place]) 01/17/2014  . Mechanical complication of other vascular device, implant, and graft 01/17/2014  . Disturbances of vision, late effect of stroke 11/03/2013  . Right hemiparesis ([place]) 08/19/2013  . CVA (cerebral infarction) 07/20/2013  . Gastroparesis due to DM ([place]) 07/01/2013  . End stage renal disease ([place]) 06/25/2013  . Anemia  01/26/2013  . DM W/NEURO MNFST, TYPE II, UNCONTROLLED 11/19/2006  . Leukocytosis 11/19/2006  . DEPENDENT EDEMA, LEGS, BILATERAL 11/19/2006  . SINUS TACHYCARDIA 10/16/2006  . Diabetes mellitus, type 2 ([place]) 05/20/2006  . HLD (hyperlipidemia) 05/20/2006  . SYNDROME, RESTLESS LEGS 05/20/2006  . PERIPHERAL NEUROPATHY 05/20/2006  . Hypertension associated with diabetes ([place]) 05/20/2006   Past Medical History:  Diagnosis Date  . Anemia   . Cataract   . Chronic kidney disease   . CVA (cerebral infarction)   . Diabetes mellitus    Type 2  . Diabetes mellitus without complication ([place])   . Dialysis patient ([place])   . Dialysis patient ([place])    M, W, F  . Fistula    R arm  . GERD (gastroesophageal reflux disease)   . Hypertension   . Renal disorder   . Shortness of breath   . Stroke [facility])    right side weakness    Family History  Problem Relation Age of Onset  . Cancer Sister   . Cancer Brother   . Anesthesia problems Neg Hx   . Hypotension Neg Hx   . Malignant hyperthermia Neg Hx   . Pseudochol deficiency Neg Hx     Past Surgical History:  Procedure Laterality Date  . AMPUTATION Right 01/13/2020   Procedure: AMPUTATION RIGHT FOOT 1ST RAY;  Surgeon: Newt Minion, MD;  Location: [place];  Service: Orthopedics;  Laterality: Right;  . AV FISTULA PLACEMENT Right 09/08/2013   Procedure: CREATION OF RIGHT BRACHIAL CEPHALIC ARTERIOVENOUS FISTULA ;  Surgeon: Mal Misty, MD;  Location: [place];  Service: Vascular;  Laterality: Right;  . BASCILIC VEIN TRANSPOSITION Right 01/26/2014   Procedure: Right Arm BASILIC VEIN TRANSPOSITION;  Surgeon: Mal Misty, MD;  Location: [place];  Service: Vascular;  Laterality: Right;  . CATARACT EXTRACTION W/PHACO  11/20/2011   Procedure: CATARACT EXTRACTION PHACO AND INTRAOCULAR LENS PLACEMENT (IOC);  Surgeon: Tonny Branch, MD;  [facility];  Service: Ophthalmology;  Laterality: Right;  CDE 18.82  . CATARACT EXTRACTION W/PHACO Left 11/18/2012    Procedure: CATARACT EXTRACTION PHACO AND INTRAOCULAR LENS PLACEMENT (IOC);  Surgeon: Tonny Branch, MD;  [facility];  Service: Ophthalmology;  Laterality: Left;  CDE: 18.97  . COLONOSCOPY N/A 02/09/2013   Procedure: COLONOSCOPY;  Surgeon: Rogene Houston, MD;  [facility];  Service: Endoscopy;  Laterality: N/A;  305-moved to 220 Ann to notify pt  . COLONOSCOPY N/A 07/08/2018   Procedure: COLONOSCOPY;  Surgeon: Rogene Houston, MD;  [facility];  Service: Endoscopy;  Laterality: N/A;  930  . INSERTION OF DIALYSIS CATHETER Right  06/24/2013   Procedure: INSERTION OF DIALYSIS CATHETER: Ultrasound guided;  Surgeon: Serafina Mitchell, MD;  Location: [place];  Service: Vascular;  Laterality: Right;  . LIGATION OF ARTERIOVENOUS  FISTULA Right 01/26/2014   Procedure: LIGATION OF ARTERIOVENOUS  FISTULA;  Surgeon: Mal Misty, MD;  Location: [place];  Service: Vascular;  Laterality: Right;  . 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 LOOP RECORDER IMPLANT N/A 07/21/2013   Procedure: LOOP RECORDER IMPLANT;  Surgeon: Evans Lance, MD;  [facility];  Service: Cardiovascular;  Laterality: N/A;  . POLYPECTOMY  07/08/2018   Procedure: POLYPECTOMY;  Surgeon: Rogene Houston, MD;  [facility];  Service: Endoscopy;;  Descending colon polyps x 2   . TEE WITHOUT CARDIOVERSION N/A 07/21/2013   Procedure: TRANSESOPHAGEAL ECHOCARDIOGRAM (TEE);  Surgeon: Dorothy Spark, MD;  Location: [facility] ENDOSCOPY;  Service: Cardiovascular;  Laterality: N/A;   Social History   Occupational History  . Not on file  Tobacco Use  . Smoking status: Never Smoker  . Smokeless tobacco: Never Used  Vaping Use  . Vaping Use: Never used  Substance and Sexual Activity  . Alcohol use: No  . Drug use: No  . Sexual activity: Not on file

## 2020-01-27 DIAGNOSIS — N186 End stage renal disease: Secondary | ICD-10-CM | POA: Diagnosis not present

## 2020-01-27 DIAGNOSIS — Z992 Dependence on renal dialysis: Secondary | ICD-10-CM | POA: Diagnosis not present

## 2020-01-30 DIAGNOSIS — Z992 Dependence on renal dialysis: Secondary | ICD-10-CM | POA: Diagnosis not present

## 2020-01-30 DIAGNOSIS — N186 End stage renal disease: Secondary | ICD-10-CM | POA: Diagnosis not present

## 2020-01-31 DIAGNOSIS — Z89411 Acquired absence of right great toe: Secondary | ICD-10-CM | POA: Diagnosis not present

## 2020-01-31 DIAGNOSIS — Z794 Long term (current) use of insulin: Secondary | ICD-10-CM | POA: Diagnosis not present

## 2020-01-31 DIAGNOSIS — N189 Chronic kidney disease, unspecified: Secondary | ICD-10-CM | POA: Diagnosis not present

## 2020-01-31 DIAGNOSIS — I10 Essential (primary) hypertension: Secondary | ICD-10-CM | POA: Diagnosis not present

## 2020-02-01 ENCOUNTER — Ambulatory Visit (INDEPENDENT_AMBULATORY_CARE_PROVIDER_SITE_OTHER): Payer: Medicare Other | Admitting: Family

## 2020-02-01 ENCOUNTER — Encounter: Payer: Self-pay | Admitting: Family

## 2020-02-01 VITALS — Ht 68.0 in | Wt 167.0 lb

## 2020-02-01 DIAGNOSIS — Z992 Dependence on renal dialysis: Secondary | ICD-10-CM | POA: Diagnosis not present

## 2020-02-01 DIAGNOSIS — N186 End stage renal disease: Secondary | ICD-10-CM | POA: Diagnosis not present

## 2020-02-01 DIAGNOSIS — Z89411 Acquired absence of right great toe: Secondary | ICD-10-CM

## 2020-02-01 NOTE — Progress Notes (Signed)
Post-Op Visit Note   Patient: [name]           Date of Birth: 01-05-1941           MRN: [id number] Visit Date: 02/01/2020 PCP: [name], MD  Chief Complaint:  Chief Complaint  Patient presents with  . Right Foot - Routine Post Op    01/13/20 right 1st ray amputation     HPI:  HPI Patient is an 79 year old woman seen status post right first ray amputation on July 30.  Unfortunately she has had some opening up of her incision her daughter has been doing dressing changes daily.  While hospitalized ABIs were performed she had 92% flow on the right Ortho Exam Incision approximated with sutures there is about 2 cm of length of the incision proximally that has healed well unfortunately there is mild dehiscence of the remainder of her incision there is some gaping for about 2 cm proximally the remainder of her incision is covered with eschar and appears to have opened up at least a centimeter in width she does have a biphasic pulse with the Doppler  Visit Diagnoses: No diagnosis found.  Plan: Continue with daily Dial soap cleansing.  Dry dressing changes.  Elevate.  Discussed nonweightbearing.  Follow-Up Instructions: Return in about 12 days (around 02/13/2020).   Imaging: No results found.  Orders:  No orders of the defined types were placed in this encounter.  No orders of the defined types were placed in this encounter.    PMFS History: Patient Active Problem List   Diagnosis Date Noted  . Gangrene of toe of right foot ([place])   . Cellulitis of right foot 01/05/2020  . History of CVA (cerebrovascular accident) 01/05/2020  . Thrombocytopenia ([place]) 01/05/2020  . Diabetic ulcer of right foot associated with diabetes mellitus due to underlying condition ([place]) 01/05/2020  . History of colonic polyps 04/01/2018  . Acute bronchitis 10/15/2017  . DM type 2, goal A1c below 7   . HCAP (healthcare-associated pneumonia) 05/29/2015  . Hyperkalemia 05/29/2015  .  Diabetes mellitus with complication ([place])   . ESRD (end stage renal disease) on dialysis ([place])   . Encounter for adequacy testing for hemodialysis ([place]) 01/17/2014  . Mechanical complication of other vascular device, implant, and graft 01/17/2014  . Disturbances of vision, late effect of stroke 11/03/2013  . Right hemiparesis ([place]) 08/19/2013  . CVA (cerebral infarction) 07/20/2013  . Gastroparesis due to DM ([place]) 07/01/2013  . End stage renal disease ([place]) 06/25/2013  . Anemia 01/26/2013  . DM W/NEURO MNFST, TYPE II, UNCONTROLLED 11/19/2006  . Leukocytosis 11/19/2006  . DEPENDENT EDEMA, LEGS, BILATERAL 11/19/2006  . SINUS TACHYCARDIA 10/16/2006  . Diabetes mellitus, type 2 ([place]) 05/20/2006  . HLD (hyperlipidemia) 05/20/2006  . SYNDROME, RESTLESS LEGS 05/20/2006  . PERIPHERAL NEUROPATHY 05/20/2006  . Hypertension associated with diabetes ([place]) 05/20/2006   Past Medical History:  Diagnosis Date  . Anemia   . Cataract   . Chronic kidney disease   . CVA (cerebral infarction)   . Diabetes mellitus    Type 2  . Diabetes mellitus without complication ([place])   . Dialysis patient ([place])   . Dialysis patient ([place])    M, W, F  . Fistula    R arm  . GERD (gastroesophageal reflux disease)   . Hypertension   . Renal disorder   . Shortness of breath   . Stroke [facility])    right side weakness    Family History  Problem Relation Age of Onset  . Cancer Sister   . Cancer Brother   . Anesthesia problems Neg Hx   . Hypotension Neg Hx   . Malignant hyperthermia Neg Hx   . Pseudochol deficiency Neg Hx     Past Surgical History:  Procedure Laterality Date  . AMPUTATION Right 01/13/2020   Procedure: AMPUTATION RIGHT FOOT 1ST RAY;  Surgeon: Newt Minion, MD;  Location: [place];  Service: Orthopedics;  Laterality: Right;  . AV FISTULA PLACEMENT Right 09/08/2013   Procedure: CREATION OF RIGHT BRACHIAL CEPHALIC ARTERIOVENOUS FISTULA ;  Surgeon: Mal Misty, MD;  Location: [place];  Service:  Vascular;  Laterality: Right;  . BASCILIC VEIN TRANSPOSITION Right 01/26/2014   Procedure: Right Arm BASILIC VEIN TRANSPOSITION;  Surgeon: Mal Misty, MD;  Location: [place];  Service: Vascular;  Laterality: Right;  . CATARACT EXTRACTION W/PHACO  11/20/2011   Procedure: CATARACT EXTRACTION PHACO AND INTRAOCULAR LENS PLACEMENT (IOC);  Surgeon: Tonny Branch, MD;  [facility];  Service: Ophthalmology;  Laterality: Right;  CDE 18.82  . CATARACT EXTRACTION W/PHACO Left 11/18/2012   Procedure: CATARACT EXTRACTION PHACO AND INTRAOCULAR LENS PLACEMENT (IOC);  Surgeon: Tonny Branch, MD;  [facility];  Service: Ophthalmology;  Laterality: Left;  CDE: 18.97  . COLONOSCOPY N/A 02/09/2013   Procedure: COLONOSCOPY;  Surgeon: Rogene Houston, MD;  [facility];  Service: Endoscopy;  Laterality: N/A;  305-moved to 220 Ann to notify pt  . COLONOSCOPY N/A 07/08/2018   Procedure: COLONOSCOPY;  Surgeon: Rogene Houston, MD;  [facility];  Service: Endoscopy;  Laterality: N/A;  930  . INSERTION OF DIALYSIS CATHETER Right 06/24/2013   Procedure: INSERTION OF DIALYSIS CATHETER: Ultrasound guided;  Surgeon: Serafina Mitchell, MD;  Location: [place];  Service: Vascular;  Laterality: Right;  . LIGATION OF ARTERIOVENOUS  FISTULA Right 01/26/2014   Procedure: LIGATION OF ARTERIOVENOUS  FISTULA;  Surgeon: Mal Misty, MD;  Location: [place];  Service: Vascular;  Laterality: Right;  . 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 LOOP RECORDER IMPLANT N/A 07/21/2013   Procedure: LOOP RECORDER IMPLANT;  Surgeon: Evans Lance, MD;  Location: [facility] CATH LAB;  Service: Cardiovascular;  Laterality: N/A;  . POLYPECTOMY  07/08/2018   Procedure: POLYPECTOMY;  Surgeon: Rogene Houston, MD;  [facility];  Service: Endoscopy;;  Descending colon polyps x 2   . TEE WITHOUT CARDIOVERSION N/A 07/21/2013   Procedure: TRANSESOPHAGEAL ECHOCARDIOGRAM (TEE);  Surgeon:  Dorothy Spark, MD;  Location: [facility] ENDOSCOPY;  Service: Cardiovascular;  Laterality: N/A;   Social History   Occupational History  . Not on file  Tobacco Use  . Smoking status: Never Smoker  . Smokeless tobacco: Never Used  Vaping Use  . Vaping Use: Never used  Substance and Sexual Activity  . Alcohol use: No  . Drug use: No  . Sexual activity: Not on file

## 2020-02-02 ENCOUNTER — Other Ambulatory Visit: Payer: Self-pay | Admitting: Physician Assistant

## 2020-02-02 ENCOUNTER — Telehealth: Payer: Self-pay | Admitting: Physician Assistant

## 2020-02-02 ENCOUNTER — Telehealth: Payer: Self-pay | Admitting: Orthopedic Surgery

## 2020-02-02 DIAGNOSIS — R262 Difficulty in walking, not elsewhere classified: Secondary | ICD-10-CM | POA: Diagnosis not present

## 2020-02-02 DIAGNOSIS — Z794 Long term (current) use of insulin: Secondary | ICD-10-CM | POA: Diagnosis not present

## 2020-02-02 DIAGNOSIS — Z8631 Personal history of diabetic foot ulcer: Secondary | ICD-10-CM | POA: Diagnosis not present

## 2020-02-02 DIAGNOSIS — Z89431 Acquired absence of right foot: Secondary | ICD-10-CM | POA: Diagnosis not present

## 2020-02-02 DIAGNOSIS — I70203 Unspecified atherosclerosis of native arteries of extremities, bilateral legs: Secondary | ICD-10-CM | POA: Diagnosis not present

## 2020-02-02 DIAGNOSIS — N186 End stage renal disease: Secondary | ICD-10-CM | POA: Diagnosis not present

## 2020-02-02 DIAGNOSIS — I120 Hypertensive chronic kidney disease with stage 5 chronic kidney disease or end stage renal disease: Secondary | ICD-10-CM | POA: Diagnosis not present

## 2020-02-02 DIAGNOSIS — Z4781 Encounter for orthopedic aftercare following surgical amputation: Secondary | ICD-10-CM | POA: Diagnosis not present

## 2020-02-02 DIAGNOSIS — E1122 Type 2 diabetes mellitus with diabetic chronic kidney disease: Secondary | ICD-10-CM | POA: Diagnosis not present

## 2020-02-02 MED ORDER — OXYCODONE HCL 5 MG PO TABS: 5.0000 mg | ORAL_TABLET | ORAL | 0 refills | Status: DC | PRN

## 2020-02-02 MED ORDER — OXYCODONE HCL 5 MG PO TABS
5.0000 mg | ORAL_TABLET | ORAL | 0 refills | Status: DC | PRN
Start: 2020-02-02 — End: 2020-02-02

## 2020-02-02 NOTE — Telephone Encounter (Signed)
This has been done. See previous message.

## 2020-02-02 NOTE — Telephone Encounter (Signed)
Please see below and advise. Thanks

## 2020-02-02 NOTE — Progress Notes (Unsigned)
Office Visit Note   Patient: [name]           Date of Birth: 1940-06-30           MRN: [id number] Visit Date: 02/02/2020              Requested by: No referring provider defined for this encounter. PCP: [name], MD  No chief complaint on file.     HPI: ***  Assessment & Plan: Visit Diagnoses: No diagnosis found.  Plan: ***  Follow-Up Instructions: No follow-ups on file.   Ortho Exam  Patient is alert, oriented, no adenopathy, well-dressed, normal affect, normal respiratory effort. ***  Imaging: No results found. No images are attached to the encounter.  Labs: Lab Results  Component Value Date   HGBA1C 7.7 (H) 01/05/2020   HGBA1C 7.5 (H) 10/14/2017   HGBA1C 9.8 (H) 06/01/2015   ESRSEDRATE 42 (H) 01/06/2020   CRP 20.3 (H) 01/06/2020   REPTSTATUS 01/10/2020 FINAL 01/05/2020   CULT  01/05/2020    NO GROWTH 5 DAYS Performed at [facility], [place] [address], [place], [place] [address]      Lab Results  Component Value Date   ALBUMIN 2.5 (L) 01/16/2020   ALBUMIN 2.9 (L) 01/06/2020   ALBUMIN 2.8 (L) 01/06/2020    Lab Results  Component Value Date   MG 1.9 06/26/2013   MG 1.7 01/25/2009   No results found for: VD25OH  No results found for: PREALBUMIN CBC EXTENDED Latest Ref Rng & Units 01/16/2020 01/13/2020 01/11/2020  WBC 4.0 - 10.5 K/uL 17.4(H) - 11.0(H)  RBC 3.87 - 5.11 MIL/uL 2.99(L) - 3.44(L)  HGB 12.0 - 15.0 g/dL 9.2(L) 13.9 10.7(L)  HCT 36 - 46 % 27.1(L) 41.0 31.0(L)  PLT 150 - 400 K/uL 161 - 168  NEUTROABS 1.7 - 7.7 K/uL - - -  LYMPHSABS 0.7 - 4.0 K/uL - - -     There is no height or weight on file to calculate BMI.  Orders:  No orders of the defined types were placed in this encounter.  Meds ordered this encounter  Medications  . DISCONTD: oxyCODONE (OXY IR/ROXICODONE) 5 MG immediate release tablet    Sig: Take 1 tablet (5 mg total) by mouth every 4 (four) hours as needed for severe pain.    Dispense:  30 tablet      Refill:  0  . oxyCODONE (OXY IR/ROXICODONE) 5 MG immediate release tablet    Sig: Take 1 tablet (5 mg total) by mouth every 4 (four) hours as needed for severe pain.    Dispense:  30 tablet    Refill:  0     Procedures: No procedures performed  Clinical Data: No additional findings.  ROS:  All other systems negative, except as noted in the HPI. Review of Systems  Objective: Vital Signs: There were no vitals taken for this visit.  Specialty Comments:  No specialty comments available.  PMFS History: Patient Active Problem List   Diagnosis Date Noted  . Gangrene of toe of right foot ([place])   . Cellulitis of right foot 01/05/2020  . History of CVA (cerebrovascular accident) 01/05/2020  . Thrombocytopenia ([place]) 01/05/2020  . Diabetic ulcer of right foot associated with diabetes mellitus due to underlying condition ([place]) 01/05/2020  . History of colonic polyps 04/01/2018  . Acute bronchitis 10/15/2017  . DM type 2, goal A1c below 7   . HCAP (healthcare-associated pneumonia) 05/29/2015  . Hyperkalemia 05/29/2015  . Diabetes  mellitus with complication ([place])   . ESRD (end stage renal disease) on dialysis ([place])   . Encounter for adequacy testing for hemodialysis ([place]) 01/17/2014  . Mechanical complication of other vascular device, implant, and graft 01/17/2014  . Disturbances of vision, late effect of stroke 11/03/2013  . Right hemiparesis ([place]) 08/19/2013  . CVA (cerebral infarction) 07/20/2013  . Gastroparesis due to DM ([place]) 07/01/2013  . End stage renal disease ([place]) 06/25/2013  . Anemia 01/26/2013  . DM W/NEURO MNFST, TYPE II, UNCONTROLLED 11/19/2006  . Leukocytosis 11/19/2006  . DEPENDENT EDEMA, LEGS, BILATERAL 11/19/2006  . SINUS TACHYCARDIA 10/16/2006  . Diabetes mellitus, type 2 ([place]) 05/20/2006  . HLD (hyperlipidemia) 05/20/2006  . SYNDROME, RESTLESS LEGS 05/20/2006  . PERIPHERAL NEUROPATHY 05/20/2006  . Hypertension associated with diabetes ([place]) 05/20/2006    Past Medical History:  Diagnosis Date  . Anemia   . Cataract   . Chronic kidney disease   . CVA (cerebral infarction)   . Diabetes mellitus    Type 2  . Diabetes mellitus without complication ([place])   . Dialysis patient ([place])   . Dialysis patient ([place])    M, W, F  . Fistula    R arm  . GERD (gastroesophageal reflux disease)   . Hypertension   . Renal disorder   . Shortness of breath   . Stroke [facility])    right side weakness    Family History  Problem Relation Age of Onset  . Cancer Sister   . Cancer Brother   . Anesthesia problems Neg Hx   . Hypotension Neg Hx   . Malignant hyperthermia Neg Hx   . Pseudochol deficiency Neg Hx     Past Surgical History:  Procedure Laterality Date  . AMPUTATION Right 01/13/2020   Procedure: AMPUTATION RIGHT FOOT 1ST RAY;  Surgeon: Newt Minion, MD;  Location: [place];  Service: Orthopedics;  Laterality: Right;  . AV FISTULA PLACEMENT Right 09/08/2013   Procedure: CREATION OF RIGHT BRACHIAL CEPHALIC ARTERIOVENOUS FISTULA ;  Surgeon: Mal Misty, MD;  Location: [place];  Service: Vascular;  Laterality: Right;  . BASCILIC VEIN TRANSPOSITION Right 01/26/2014   Procedure: Right Arm BASILIC VEIN TRANSPOSITION;  Surgeon: Mal Misty, MD;  Location: [place];  Service: Vascular;  Laterality: Right;  . CATARACT EXTRACTION W/PHACO  11/20/2011   Procedure: CATARACT EXTRACTION PHACO AND INTRAOCULAR LENS PLACEMENT (IOC);  Surgeon: Tonny Branch, MD;  [facility];  Service: Ophthalmology;  Laterality: Right;  CDE 18.82  . CATARACT EXTRACTION W/PHACO Left 11/18/2012   Procedure: CATARACT EXTRACTION PHACO AND INTRAOCULAR LENS PLACEMENT (IOC);  Surgeon: Tonny Branch, MD;  [facility];  Service: Ophthalmology;  Laterality: Left;  CDE: 18.97  . COLONOSCOPY N/A 02/09/2013   Procedure: COLONOSCOPY;  Surgeon: Rogene Houston, MD;  [facility];  Service: Endoscopy;  Laterality: N/A;  305-moved to 220 Ann to notify pt  . COLONOSCOPY N/A 07/08/2018    Procedure: COLONOSCOPY;  Surgeon: Rogene Houston, MD;  [facility];  Service: Endoscopy;  Laterality: N/A;  930  . INSERTION OF DIALYSIS CATHETER Right 06/24/2013   Procedure: INSERTION OF DIALYSIS CATHETER: Ultrasound guided;  Surgeon: Serafina Mitchell, MD;  Location: [place];  Service: Vascular;  Laterality: Right;  . LIGATION OF ARTERIOVENOUS  FISTULA Right 01/26/2014   Procedure: LIGATION OF ARTERIOVENOUS  FISTULA;  Surgeon: Mal Misty, MD;  Location: [place];  Service: Vascular;  Laterality: Right;  . LOOP RECORDER IMPLANT  07-21-13  MDT LinQ implanted by Dr Lovena Le for cryptogenic stroke  . LOOP RECORDER IMPLANT N/A 07/21/2013   Procedure: LOOP RECORDER IMPLANT;  Surgeon: Evans Lance, MD;  Location: [facility] CATH LAB;  Service: Cardiovascular;  Laterality: N/A;  . POLYPECTOMY  07/08/2018   Procedure: POLYPECTOMY;  Surgeon: Rogene Houston, MD;  [facility];  Service: Endoscopy;;  Descending colon polyps x 2   . TEE WITHOUT CARDIOVERSION N/A 07/21/2013   Procedure: TRANSESOPHAGEAL ECHOCARDIOGRAM (TEE);  Surgeon: Dorothy Spark, MD;  Location: [facility] ENDOSCOPY;  Service: Cardiovascular;  Laterality: N/A;   Social History   Occupational History  . Not on file  Tobacco Use  . Smoking status: Never Smoker  . Smokeless tobacco: Never Used  Vaping Use  . Vaping Use: Never used  Substance and Sexual Activity  . Alcohol use: No  . Drug use: No  . Sexual activity: Not on file

## 2020-02-02 NOTE — Telephone Encounter (Signed)
I called and sw pt's daughter to advise that the rx had been sent to the apothecary originally it was sent to the compounding pharm and that's why it would not go through. This has been corrected and has been sent and will be available for pick up this afternoon. To call with any questions.

## 2020-02-02 NOTE — Telephone Encounter (Signed)
Patient's [name] called advised the pharnacy do not have Oxycodone and do not know when they might get it in again.  [name] asked the Rx be sent to [facility] in [place]? The number to contact [name] is [phone]

## 2020-02-02 NOTE — Telephone Encounter (Signed)
Cant send to that pharmacy

## 2020-02-02 NOTE — Telephone Encounter (Signed)
Received call from Janene Madeira (PT) with [name] asking about the Rx for the patient. Patient's daughter Doralee Albino was with him on speaker phone. I read note from [facility] to her. Priscilla asked if the RX can be sent to the [facility] in [place]? The number to contact [name] is [phone]

## 2020-02-03 ENCOUNTER — Ambulatory Visit: Payer: Medicare Other | Admitting: Family

## 2020-02-03 DIAGNOSIS — N186 End stage renal disease: Secondary | ICD-10-CM | POA: Diagnosis not present

## 2020-02-03 DIAGNOSIS — Z992 Dependence on renal dialysis: Secondary | ICD-10-CM | POA: Diagnosis not present

## 2020-02-06 DIAGNOSIS — N2581 Secondary hyperparathyroidism of renal origin: Secondary | ICD-10-CM | POA: Diagnosis not present

## 2020-02-06 DIAGNOSIS — Z992 Dependence on renal dialysis: Secondary | ICD-10-CM | POA: Diagnosis not present

## 2020-02-06 DIAGNOSIS — N186 End stage renal disease: Secondary | ICD-10-CM | POA: Diagnosis not present

## 2020-02-07 DIAGNOSIS — R262 Difficulty in walking, not elsewhere classified: Secondary | ICD-10-CM | POA: Diagnosis not present

## 2020-02-07 DIAGNOSIS — Z89431 Acquired absence of right foot: Secondary | ICD-10-CM | POA: Diagnosis not present

## 2020-02-07 DIAGNOSIS — Z794 Long term (current) use of insulin: Secondary | ICD-10-CM | POA: Diagnosis not present

## 2020-02-07 DIAGNOSIS — I70203 Unspecified atherosclerosis of native arteries of extremities, bilateral legs: Secondary | ICD-10-CM | POA: Diagnosis not present

## 2020-02-07 DIAGNOSIS — I120 Hypertensive chronic kidney disease with stage 5 chronic kidney disease or end stage renal disease: Secondary | ICD-10-CM | POA: Diagnosis not present

## 2020-02-07 DIAGNOSIS — Z8631 Personal history of diabetic foot ulcer: Secondary | ICD-10-CM | POA: Diagnosis not present

## 2020-02-07 DIAGNOSIS — Z4781 Encounter for orthopedic aftercare following surgical amputation: Secondary | ICD-10-CM | POA: Diagnosis not present

## 2020-02-07 DIAGNOSIS — E1122 Type 2 diabetes mellitus with diabetic chronic kidney disease: Secondary | ICD-10-CM | POA: Diagnosis not present

## 2020-02-07 DIAGNOSIS — N186 End stage renal disease: Secondary | ICD-10-CM | POA: Diagnosis not present

## 2020-02-08 DIAGNOSIS — Z992 Dependence on renal dialysis: Secondary | ICD-10-CM | POA: Diagnosis not present

## 2020-02-08 DIAGNOSIS — N186 End stage renal disease: Secondary | ICD-10-CM | POA: Diagnosis not present

## 2020-02-10 DIAGNOSIS — Z992 Dependence on renal dialysis: Secondary | ICD-10-CM | POA: Diagnosis not present

## 2020-02-10 DIAGNOSIS — N186 End stage renal disease: Secondary | ICD-10-CM | POA: Diagnosis not present

## 2020-02-13 DIAGNOSIS — Z992 Dependence on renal dialysis: Secondary | ICD-10-CM | POA: Diagnosis not present

## 2020-02-13 DIAGNOSIS — N186 End stage renal disease: Secondary | ICD-10-CM | POA: Diagnosis not present

## 2020-02-14 DIAGNOSIS — Z992 Dependence on renal dialysis: Secondary | ICD-10-CM | POA: Diagnosis not present

## 2020-02-14 DIAGNOSIS — Z794 Long term (current) use of insulin: Secondary | ICD-10-CM | POA: Diagnosis not present

## 2020-02-14 DIAGNOSIS — Z8631 Personal history of diabetic foot ulcer: Secondary | ICD-10-CM | POA: Diagnosis not present

## 2020-02-14 DIAGNOSIS — I70203 Unspecified atherosclerosis of native arteries of extremities, bilateral legs: Secondary | ICD-10-CM | POA: Diagnosis not present

## 2020-02-14 DIAGNOSIS — N186 End stage renal disease: Secondary | ICD-10-CM | POA: Diagnosis not present

## 2020-02-14 DIAGNOSIS — R262 Difficulty in walking, not elsewhere classified: Secondary | ICD-10-CM | POA: Diagnosis not present

## 2020-02-14 DIAGNOSIS — I120 Hypertensive chronic kidney disease with stage 5 chronic kidney disease or end stage renal disease: Secondary | ICD-10-CM | POA: Diagnosis not present

## 2020-02-14 DIAGNOSIS — Z4781 Encounter for orthopedic aftercare following surgical amputation: Secondary | ICD-10-CM | POA: Diagnosis not present

## 2020-02-14 DIAGNOSIS — E1122 Type 2 diabetes mellitus with diabetic chronic kidney disease: Secondary | ICD-10-CM | POA: Diagnosis not present

## 2020-02-14 DIAGNOSIS — Z89431 Acquired absence of right foot: Secondary | ICD-10-CM | POA: Diagnosis not present

## 2020-02-14 DIAGNOSIS — E519 Thiamine deficiency, unspecified: Secondary | ICD-10-CM | POA: Diagnosis not present

## 2020-02-15 ENCOUNTER — Ambulatory Visit: Payer: Medicare Other | Admitting: Family

## 2020-02-15 DIAGNOSIS — Z992 Dependence on renal dialysis: Secondary | ICD-10-CM | POA: Diagnosis not present

## 2020-02-15 DIAGNOSIS — N186 End stage renal disease: Secondary | ICD-10-CM | POA: Diagnosis not present

## 2020-02-17 DIAGNOSIS — N186 End stage renal disease: Secondary | ICD-10-CM | POA: Diagnosis not present

## 2020-02-17 DIAGNOSIS — Z992 Dependence on renal dialysis: Secondary | ICD-10-CM | POA: Diagnosis not present

## 2020-02-20 DIAGNOSIS — N186 End stage renal disease: Secondary | ICD-10-CM | POA: Diagnosis not present

## 2020-02-20 DIAGNOSIS — Z992 Dependence on renal dialysis: Secondary | ICD-10-CM | POA: Diagnosis not present

## 2020-02-21 ENCOUNTER — Telehealth: Payer: Self-pay | Admitting: Orthopedic Surgery

## 2020-02-21 DIAGNOSIS — Z794 Long term (current) use of insulin: Secondary | ICD-10-CM | POA: Diagnosis not present

## 2020-02-21 DIAGNOSIS — Z4781 Encounter for orthopedic aftercare following surgical amputation: Secondary | ICD-10-CM | POA: Diagnosis not present

## 2020-02-21 DIAGNOSIS — R262 Difficulty in walking, not elsewhere classified: Secondary | ICD-10-CM | POA: Diagnosis not present

## 2020-02-21 DIAGNOSIS — I70203 Unspecified atherosclerosis of native arteries of extremities, bilateral legs: Secondary | ICD-10-CM | POA: Diagnosis not present

## 2020-02-21 DIAGNOSIS — E1122 Type 2 diabetes mellitus with diabetic chronic kidney disease: Secondary | ICD-10-CM | POA: Diagnosis not present

## 2020-02-21 DIAGNOSIS — N186 End stage renal disease: Secondary | ICD-10-CM | POA: Diagnosis not present

## 2020-02-21 DIAGNOSIS — Z89431 Acquired absence of right foot: Secondary | ICD-10-CM | POA: Diagnosis not present

## 2020-02-21 DIAGNOSIS — I120 Hypertensive chronic kidney disease with stage 5 chronic kidney disease or end stage renal disease: Secondary | ICD-10-CM | POA: Diagnosis not present

## 2020-02-21 DIAGNOSIS — Z8631 Personal history of diabetic foot ulcer: Secondary | ICD-10-CM | POA: Diagnosis not present

## 2020-02-21 NOTE — Telephone Encounter (Signed)
Patient was supposed to follow up with Erin last week and did not . Needs to be seen

## 2020-02-21 NOTE — Telephone Encounter (Signed)
Pt would like a refill of her pain medicine called in, pts [name] states she had PT today and believes that was the cause. [name] would like a CB when that's been sent in so she can go pick it up for the pt.  [phone]

## 2020-02-21 NOTE — Telephone Encounter (Signed)
Pt is s/p a first ray amputation on 01/13/20 started PT and is requesting a refill on her Oxycodone last refill was 02/02/20 #30

## 2020-02-22 ENCOUNTER — Encounter: Payer: Self-pay | Admitting: Physician Assistant

## 2020-02-22 ENCOUNTER — Ambulatory Visit (INDEPENDENT_AMBULATORY_CARE_PROVIDER_SITE_OTHER): Payer: Medicare Other | Admitting: Physician Assistant

## 2020-02-22 VITALS — Ht 68.0 in | Wt 167.0 lb

## 2020-02-22 DIAGNOSIS — E08621 Diabetes mellitus due to underlying condition with foot ulcer: Secondary | ICD-10-CM

## 2020-02-22 DIAGNOSIS — L97411 Non-pressure chronic ulcer of right heel and midfoot limited to breakdown of skin: Secondary | ICD-10-CM

## 2020-02-22 DIAGNOSIS — Z992 Dependence on renal dialysis: Secondary | ICD-10-CM | POA: Diagnosis not present

## 2020-02-22 DIAGNOSIS — G609 Hereditary and idiopathic neuropathy, unspecified: Secondary | ICD-10-CM

## 2020-02-22 DIAGNOSIS — N186 End stage renal disease: Secondary | ICD-10-CM | POA: Diagnosis not present

## 2020-02-22 MED ORDER — PENTOXIFYLLINE ER 400 MG PO TBCR: 400.0000 mg | EXTENDED_RELEASE_TABLET | Freq: Every day | ORAL | 0 refills | Status: DC

## 2020-02-22 MED ORDER — NITROGLYCERIN 0.2 MG/HR TD PT24: 0.2000 mg | MEDICATED_PATCH | Freq: Every day | TRANSDERMAL | 12 refills | Status: DC

## 2020-02-22 MED ORDER — OXYCODONE HCL 5 MG PO TABS
5.0000 mg | ORAL_TABLET | Freq: Four times a day (QID) | ORAL | 0 refills | Status: DC | PRN
Start: 2020-02-22 — End: 2020-02-29

## 2020-02-22 NOTE — Progress Notes (Signed)
Office Visit Note   Patient: [name]           Date of Birth: 09/07/1940           MRN: [id number] Visit Date: 02/22/2020              Requested by: [name], [address] [place] [address] [place],  [place] [address] PCP: [name], MD  Chief Complaint  Patient presents with  . Right Foot - Routine Post Op    01/13/20 right foot 1st ray amputation       HPI: This is a pleasant 79 year old woman who is 5 weeks status post ray amputation.  On the right.  She is here for follow-up she has had some slow wound healing.  Assessment & Plan: Visit Diagnoses: No diagnosis found.  Plan: I have prescribed a nitroglycerin patch as well as Trental and renal dosing of 400 mg daily with food.  She will follow-up in 1 week.  Her daughter will use a little bit of Silvadene and I demonstrated this to her for dressing changes  Follow-Up Instructions: No follow-ups on file.   Ortho Exam  Patient is alert, oriented, no adenopathy, well-dressed, normal affect, normal respiratory effort. Focused examination demonstrates swelling is fairly well-controlled she does have wound dehiscence with superficial necrosis.  There is no foul odor.  No cellulitis.  She does have a palpable dorsalis pedis pulse.  Imaging: No results found. No images are attached to the encounter.  Labs: Lab Results  Component Value Date   HGBA1C 7.7 (H) 01/05/2020   HGBA1C 7.5 (H) 10/14/2017   HGBA1C 9.8 (H) 06/01/2015   ESRSEDRATE 42 (H) 01/06/2020   CRP 20.3 (H) 01/06/2020   REPTSTATUS 01/10/2020 FINAL 01/05/2020   CULT  01/05/2020    NO GROWTH 5 DAYS Performed at [facility], [place] [address] [place], [place] [address]      Lab Results  Component Value Date   ALBUMIN 2.5 (L) 01/16/2020   ALBUMIN 2.9 (L) 01/06/2020   ALBUMIN 2.8 (L) 01/06/2020    Lab Results  Component Value Date   MG 1.9 06/26/2013   MG 1.7 01/25/2009   No results found for: VD25OH  No results found for: PREALBUMIN CBC  EXTENDED Latest Ref Rng & Units 01/16/2020 01/13/2020 01/11/2020  WBC 4.0 - 10.5 K/uL 17.4(H) - 11.0(H)  RBC 3.87 - 5.11 MIL/uL 2.99(L) - 3.44(L)  HGB 12.0 - 15.0 g/dL 9.2(L) 13.9 10.7(L)  HCT 36 - 46 % 27.1(L) 41.0 31.0(L)  PLT 150 - 400 K/uL 161 - 168  NEUTROABS 1.7 - 7.7 K/uL - - -  LYMPHSABS 0.7 - 4.0 K/uL - - -     Body mass index is 25.39 kg/m.  Orders:  No orders of the defined types were placed in this encounter.  Meds ordered this encounter  Medications  . oxyCODONE (OXY IR/ROXICODONE) 5 MG immediate release tablet    Sig: Take 1 tablet (5 mg total) by mouth every 6 (six) hours as needed for severe pain.    Dispense:  30 tablet    Refill:  0  . pentoxifylline (TRENTAL) 400 MG CR tablet    Sig: Take 1 tablet (400 mg total) by mouth daily with breakfast.    Dispense:  30 tablet    Refill:  0  . nitroGLYCERIN (NITRODUR - DOSED IN MG/24 HR) 0.2 mg/hr patch    Sig: Place 1 patch (0.2 mg total) onto the skin daily.  Dispense:  30 patch    Refill:  12     Procedures: No procedures performed  Clinical Data: No additional findings.  ROS:  All other systems negative, except as noted in the HPI. Review of Systems  Objective: Vital Signs: Ht 5\' 8"  (1.727 m)   Wt 167 lb (75.8 kg)   BMI 25.39 kg/m   Specialty Comments:  No specialty comments available.  PMFS History: Patient Active Problem List   Diagnosis Date Noted  . Gangrene of toe of right foot ([place])   . Cellulitis of right foot 01/05/2020  . History of CVA (cerebrovascular accident) 01/05/2020  . Thrombocytopenia ([place]) 01/05/2020  . Diabetic ulcer of right foot associated with diabetes mellitus due to underlying condition ([place]) 01/05/2020  . History of colonic polyps 04/01/2018  . Acute bronchitis 10/15/2017  . DM type 2, goal A1c below 7   . HCAP (healthcare-associated pneumonia) 05/29/2015  . Hyperkalemia 05/29/2015  . Diabetes mellitus with complication ([place])   . ESRD (end stage renal disease) on  dialysis ([place])   . Encounter for adequacy testing for hemodialysis ([place]) 01/17/2014  . Mechanical complication of other vascular device, implant, and graft 01/17/2014  . Disturbances of vision, late effect of stroke 11/03/2013  . Right hemiparesis ([place]) 08/19/2013  . CVA (cerebral infarction) 07/20/2013  . Gastroparesis due to DM ([place]) 07/01/2013  . End stage renal disease ([place]) 06/25/2013  . Anemia 01/26/2013  . DM W/NEURO MNFST, TYPE II, UNCONTROLLED 11/19/2006  . Leukocytosis 11/19/2006  . DEPENDENT EDEMA, LEGS, BILATERAL 11/19/2006  . SINUS TACHYCARDIA 10/16/2006  . Diabetes mellitus, type 2 ([place]) 05/20/2006  . HLD (hyperlipidemia) 05/20/2006  . SYNDROME, RESTLESS LEGS 05/20/2006  . PERIPHERAL NEUROPATHY 05/20/2006  . Hypertension associated with diabetes ([place]) 05/20/2006   Past Medical History:  Diagnosis Date  . Anemia   . Cataract   . Chronic kidney disease   . CVA (cerebral infarction)   . Diabetes mellitus    Type 2  . Diabetes mellitus without complication ([place])   . Dialysis patient ([place])   . Dialysis patient ([place])    M, W, F  . Fistula    R arm  . GERD (gastroesophageal reflux disease)   . Hypertension   . Renal disorder   . Shortness of breath   . Stroke [facility])    right side weakness    Family History  Problem Relation Age of Onset  . Cancer Sister   . Cancer Brother   . Anesthesia problems Neg Hx   . Hypotension Neg Hx   . Malignant hyperthermia Neg Hx   . Pseudochol deficiency Neg Hx     Past Surgical History:  Procedure Laterality Date  . AMPUTATION Right 01/13/2020   Procedure: AMPUTATION RIGHT FOOT 1ST RAY;  Surgeon: Newt Minion, MD;  Location: [place];  Service: Orthopedics;  Laterality: Right;  . AV FISTULA PLACEMENT Right 09/08/2013   Procedure: CREATION OF RIGHT BRACHIAL CEPHALIC ARTERIOVENOUS FISTULA ;  Surgeon: Mal Misty, MD;  Location: [place];  Service: Vascular;  Laterality: Right;  . BASCILIC VEIN TRANSPOSITION Right 01/26/2014    Procedure: Right Arm BASILIC VEIN TRANSPOSITION;  Surgeon: Mal Misty, MD;  Location: [place];  Service: Vascular;  Laterality: Right;  . CATARACT EXTRACTION W/PHACO  11/20/2011   Procedure: CATARACT EXTRACTION PHACO AND INTRAOCULAR LENS PLACEMENT (IOC);  Surgeon: Tonny Branch, MD;  [facility];  Service: Ophthalmology;  Laterality: Right;  CDE 18.82  . CATARACT EXTRACTION W/PHACO Left 11/18/2012  Procedure: CATARACT EXTRACTION PHACO AND INTRAOCULAR LENS PLACEMENT (IOC);  Surgeon: Tonny Branch, MD;  [facility]: [facility];  Service: Ophthalmology;  Laterality: Left;  CDE: 18.97  . COLONOSCOPY N/A 02/09/2013   Procedure: COLONOSCOPY;  Surgeon: Rogene Houston, MD;  [facility];  Service: Endoscopy;  Laterality: N/A;  305-moved to 220 Ann to notify pt  . COLONOSCOPY N/A 07/08/2018   Procedure: COLONOSCOPY;  Surgeon: Rogene Houston, MD;  [facility];  Service: Endoscopy;  Laterality: N/A;  930  . INSERTION OF DIALYSIS CATHETER Right 06/24/2013   Procedure: INSERTION OF DIALYSIS CATHETER: Ultrasound guided;  Surgeon: Serafina Mitchell, MD;  Location: [place];  Service: Vascular;  Laterality: Right;  . LIGATION OF ARTERIOVENOUS  FISTULA Right 01/26/2014   Procedure: LIGATION OF ARTERIOVENOUS  FISTULA;  Surgeon: Mal Misty, MD;  Location: [place];  Service: Vascular;  Laterality: Right;  . 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 LOOP RECORDER IMPLANT N/A 07/21/2013   Procedure: LOOP RECORDER IMPLANT;  Surgeon: Evans Lance, MD;  Location: [facility] C[facility] LAB;  Service: Cardiovascular;  Laterality: N/A;  . POLYPECTOMY  07/08/2018   Procedure: POLYPECTOMY;  Surgeon: Rogene Houston, MD;  [facility];  Service: Endoscopy;;  Descending colon polyps x 2   . TEE WITHOUT CARDIOVERSION N/A 07/21/2013   Procedure: TRANSESOPHAGEAL ECHOCARDIOGRAM (TEE);  Surgeon: Dorothy Spark, MD;  Location: [facility] ENDOSCOPY;  Service: Cardiovascular;  Laterality:  N/A;   Social History   Occupational History  . Not on file  Tobacco Use  . Smoking status: Never Smoker  . Smokeless tobacco: Never Used  Vaping Use  . Vaping Use: Never used  Substance and Sexual Activity  . Alcohol use: No  . Drug use: No  . Sexual activity: Not on file

## 2020-02-22 NOTE — Telephone Encounter (Signed)
Pt coming in for an appt today.

## 2020-02-24 DIAGNOSIS — N186 End stage renal disease: Secondary | ICD-10-CM | POA: Diagnosis not present

## 2020-02-24 DIAGNOSIS — Z992 Dependence on renal dialysis: Secondary | ICD-10-CM | POA: Diagnosis not present

## 2020-02-27 DIAGNOSIS — N186 End stage renal disease: Secondary | ICD-10-CM | POA: Diagnosis not present

## 2020-02-27 DIAGNOSIS — Z992 Dependence on renal dialysis: Secondary | ICD-10-CM | POA: Diagnosis not present

## 2020-02-27 DIAGNOSIS — N2581 Secondary hyperparathyroidism of renal origin: Secondary | ICD-10-CM | POA: Diagnosis not present

## 2020-02-28 ENCOUNTER — Ambulatory Visit: Payer: Medicare Other | Admitting: Family

## 2020-02-28 DIAGNOSIS — Z4781 Encounter for orthopedic aftercare following surgical amputation: Secondary | ICD-10-CM | POA: Diagnosis not present

## 2020-02-28 DIAGNOSIS — E1122 Type 2 diabetes mellitus with diabetic chronic kidney disease: Secondary | ICD-10-CM | POA: Diagnosis not present

## 2020-02-28 DIAGNOSIS — I120 Hypertensive chronic kidney disease with stage 5 chronic kidney disease or end stage renal disease: Secondary | ICD-10-CM | POA: Diagnosis not present

## 2020-02-28 DIAGNOSIS — Z89431 Acquired absence of right foot: Secondary | ICD-10-CM | POA: Diagnosis not present

## 2020-02-28 DIAGNOSIS — Z794 Long term (current) use of insulin: Secondary | ICD-10-CM | POA: Diagnosis not present

## 2020-02-28 DIAGNOSIS — R262 Difficulty in walking, not elsewhere classified: Secondary | ICD-10-CM | POA: Diagnosis not present

## 2020-02-28 DIAGNOSIS — N186 End stage renal disease: Secondary | ICD-10-CM | POA: Diagnosis not present

## 2020-02-28 DIAGNOSIS — Z8631 Personal history of diabetic foot ulcer: Secondary | ICD-10-CM | POA: Diagnosis not present

## 2020-02-28 DIAGNOSIS — I70203 Unspecified atherosclerosis of native arteries of extremities, bilateral legs: Secondary | ICD-10-CM | POA: Diagnosis not present

## 2020-02-29 ENCOUNTER — Ambulatory Visit (INDEPENDENT_AMBULATORY_CARE_PROVIDER_SITE_OTHER): Payer: Medicare Other | Admitting: Family

## 2020-02-29 ENCOUNTER — Encounter: Payer: Self-pay | Admitting: Family

## 2020-02-29 DIAGNOSIS — Z89411 Acquired absence of right great toe: Secondary | ICD-10-CM

## 2020-02-29 DIAGNOSIS — Z992 Dependence on renal dialysis: Secondary | ICD-10-CM | POA: Diagnosis not present

## 2020-02-29 DIAGNOSIS — N186 End stage renal disease: Secondary | ICD-10-CM | POA: Diagnosis not present

## 2020-02-29 MED ORDER — OXYCODONE HCL 5 MG PO TABS
5.0000 mg | ORAL_TABLET | Freq: Four times a day (QID) | ORAL | 0 refills | Status: DC | PRN
Start: 2020-02-29 — End: 2020-03-05

## 2020-02-29 NOTE — Addendum Note (Signed)
Addended by: Dondra Prader R on: 02/29/2020 01:23 PM   Modules accepted: Orders

## 2020-02-29 NOTE — Progress Notes (Signed)
Post-Op Visit Note   Patient: [name]           Date of Birth: May 31, 1941           MRN: [id number] Visit Date: 02/29/2020 PCP: [name], MD  Chief Complaint: No chief complaint on file.   HPI:  HPI The patient is an 79 year old woman who presents today 6 weeks status post first ray amputation of the right foot.  She has been slow to heal appears as though she has had some wound dehiscence.  She has been using a nitroglycerin patch as well as Trental and Silvadene dressing changes daily Ortho Exam On examination of the incision there is dehiscence near full length of the incision this is covered with some nonviable tissue.  Sutures harvested today without incident.  Nonviable tissue debrided back with a 10 blade knife underlying fibrinous exudative tissue.  There is no probing to bone or fascia.  There is no active drainage no surrounding erythema no odor no sign of infection  Visit Diagnoses:  1. 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  Given a tube of Iodosorb Iodosorb dressing changes daily continue to minimize weightbearing  Follow-Up Instructions: Return in about 2 weeks (around 03/14/2020).   Imaging: No results found.  Orders:  No orders of the defined types were placed in this encounter.  No orders of the defined types were placed in this encounter.    PMFS History: Patient Active Problem List   Diagnosis Date Noted  . Gangrene of toe of right foot ([place])   . Cellulitis of right foot 01/05/2020  . History of CVA (cerebrovascular accident) 01/05/2020  . Thrombocytopenia ([place]) 01/05/2020  . Diabetic ulcer of right foot associated with diabetes mellitus due to underlying condition ([place]) 01/05/2020  . History of colonic polyps 04/01/2018  . Acute bronchitis 10/15/2017  . DM type 2, goal A1c below 7   . HCAP (healthcare-associated pneumonia) 05/29/2015  . Hyperkalemia  05/29/2015  . Diabetes mellitus with complication ([place])   . ESRD (end stage renal disease) on dialysis ([place])   . Encounter for adequacy testing for hemodialysis ([place]) 01/17/2014  . Mechanical complication of other vascular device, implant, and graft 01/17/2014  . Disturbances of vision, late effect of stroke 11/03/2013  . Right hemiparesis ([place]) 08/19/2013  . CVA (cerebral infarction) 07/20/2013  . Gastroparesis due to DM ([place]) 07/01/2013  . End stage renal disease ([place]) 06/25/2013  . Anemia 01/26/2013  . DM W/NEURO MNFST, TYPE II, UNCONTROLLED 11/19/2006  . Leukocytosis 11/19/2006  . DEPENDENT EDEMA, LEGS, BILATERAL 11/19/2006  . SINUS TACHYCARDIA 10/16/2006  . Diabetes mellitus, type 2 ([place]) 05/20/2006  . HLD (hyperlipidemia) 05/20/2006  . SYNDROME, RESTLESS LEGS 05/20/2006  . Hereditary and idiopathic peripheral neuropathy 05/20/2006  . Hypertension associated with diabetes ([place]) 05/20/2006   Past Medical History:  Diagnosis Date  . Anemia   . Cataract   . Chronic kidney disease   . CVA (cerebral infarction)   . Diabetes mellitus    Type 2  . Diabetes mellitus without complication ([place])   . Dialysis patient ([place])   . Dialysis patient ([place])    M, W, F  . Fistula    R arm  . GERD (gastroesophageal reflux disease)   . Hypertension   . Renal disorder   . Shortness of breath   . Stroke [facility])    right side  weakness    Family History  Problem Relation Age of Onset  . Cancer Sister   . Cancer Brother   . Anesthesia problems Neg Hx   . Hypotension Neg Hx   . Malignant hyperthermia Neg Hx   . Pseudochol deficiency Neg Hx     Past Surgical History:  Procedure Laterality Date  . AMPUTATION Right 01/13/2020   Procedure: AMPUTATION RIGHT FOOT 1ST RAY;  Surgeon: Newt Minion, MD;  Location: [place];  Service: Orthopedics;  Laterality: Right;  . AV FISTULA PLACEMENT Right 09/08/2013   Procedure: CREATION OF RIGHT BRACHIAL CEPHALIC ARTERIOVENOUS FISTULA ;  Surgeon: Mal Misty, MD;  Location: [place];  Service: Vascular;  Laterality: Right;  . BASCILIC VEIN TRANSPOSITION Right 01/26/2014   Procedure: Right Arm BASILIC VEIN TRANSPOSITION;  Surgeon: Mal Misty, MD;  Location: [place];  Service: Vascular;  Laterality: Right;  . CATARACT EXTRACTION W/PHACO  11/20/2011   Procedure: CATARACT EXTRACTION PHACO AND INTRAOCULAR LENS PLACEMENT (IOC);  Surgeon: Tonny Branch, MD;  [facility];  Service: Ophthalmology;  Laterality: Right;  CDE 18.82  . CATARACT EXTRACTION W/PHACO Left 11/18/2012   Procedure: CATARACT EXTRACTION PHACO AND INTRAOCULAR LENS PLACEMENT (IOC);  Surgeon: Tonny Branch, MD;  [facility];  Service: Ophthalmology;  Laterality: Left;  CDE: 18.97  . COLONOSCOPY N/A 02/09/2013   Procedure: COLONOSCOPY;  Surgeon: Rogene Houston, MD;  [facility];  Service: Endoscopy;  Laterality: N/A;  305-moved to 220 Ann to notify pt  . COLONOSCOPY N/A 07/08/2018   Procedure: COLONOSCOPY;  Surgeon: Rogene Houston, MD;  [facility];  Service: Endoscopy;  Laterality: N/A;  930  . INSERTION OF DIALYSIS CATHETER Right 06/24/2013   Procedure: INSERTION OF DIALYSIS CATHETER: Ultrasound guided;  Surgeon: Serafina Mitchell, MD;  Location: [place];  Service: Vascular;  Laterality: Right;  . LIGATION OF ARTERIOVENOUS  FISTULA Right 01/26/2014   Procedure: LIGATION OF ARTERIOVENOUS  FISTULA;  Surgeon: Mal Misty, MD;  Location: [place];  Service: Vascular;  Laterality: Right;  . 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 LOOP RECORDER IMPLANT N/A 07/21/2013   Procedure: LOOP RECORDER IMPLANT;  Surgeon: Evans Lance, MD;  Location: [facility] CATH LAB;  Service: Cardiovascular;  Laterality: N/A;  . POLYPECTOMY  07/08/2018   Procedure: POLYPECTOMY;  Surgeon: Rogene Houston, MD;  [facility];  Service: Endoscopy;;  Descending colon polyps x 2   . TEE WITHOUT CARDIOVERSION N/A 07/21/2013   Procedure:  TRANSESOPHAGEAL ECHOCARDIOGRAM (TEE);  Surgeon: Dorothy Spark, MD;  Location: [facility] ENDOSCOPY;  Service: Cardiovascular;  Laterality: N/A;   Social History   Occupational History  . Not on file  Tobacco Use  . Smoking status: Never Smoker  . Smokeless tobacco: Never Used  Vaping Use  . Vaping Use: Never used  Substance and Sexual Activity  . Alcohol use: No  . Drug use: No  . Sexual activity: Not on file

## 2020-03-02 ENCOUNTER — Telehealth: Payer: Self-pay

## 2020-03-02 DIAGNOSIS — N186 End stage renal disease: Secondary | ICD-10-CM | POA: Diagnosis not present

## 2020-03-02 DIAGNOSIS — Z992 Dependence on renal dialysis: Secondary | ICD-10-CM | POA: Diagnosis not present

## 2020-03-02 NOTE — Telephone Encounter (Signed)
Did you discuss referring this pt out for pain management at last office visit?

## 2020-03-02 NOTE — Telephone Encounter (Signed)
Patient daughter called she stated vpain management was supposed to be called and it hasn't been done call back:[phone]

## 2020-03-05 ENCOUNTER — Other Ambulatory Visit: Payer: Self-pay | Admitting: Physician Assistant

## 2020-03-05 ENCOUNTER — Telehealth: Payer: Self-pay | Admitting: Orthopedic Surgery

## 2020-03-05 DIAGNOSIS — Z992 Dependence on renal dialysis: Secondary | ICD-10-CM | POA: Diagnosis not present

## 2020-03-05 DIAGNOSIS — N186 End stage renal disease: Secondary | ICD-10-CM | POA: Diagnosis not present

## 2020-03-05 MED ORDER — OXYCODONE HCL 5 MG PO TABS
5.0000 mg | ORAL_TABLET | Freq: Four times a day (QID) | ORAL | 0 refills | Status: DC | PRN
Start: 2020-03-05 — End: 2020-03-14

## 2020-03-05 NOTE — Telephone Encounter (Signed)
01/13/20 s/p right foot ray amputation and is requesting a refill on her pain medication.

## 2020-03-05 NOTE — Telephone Encounter (Signed)
Called and lm on vm to advise that rx has been sent to the pharm. To call with questions.

## 2020-03-05 NOTE — Telephone Encounter (Signed)
Done

## 2020-03-05 NOTE — Telephone Encounter (Signed)
Patient daughter [name] called requesting patient refill of oxycodone. Please send to pharmacy on file. Please call patient or daughter when medication has been called in. [place] phone number is [phone].

## 2020-03-06 DIAGNOSIS — Z4781 Encounter for orthopedic aftercare following surgical amputation: Secondary | ICD-10-CM | POA: Diagnosis not present

## 2020-03-06 DIAGNOSIS — N186 End stage renal disease: Secondary | ICD-10-CM | POA: Diagnosis not present

## 2020-03-06 DIAGNOSIS — I70203 Unspecified atherosclerosis of native arteries of extremities, bilateral legs: Secondary | ICD-10-CM | POA: Diagnosis not present

## 2020-03-06 DIAGNOSIS — I120 Hypertensive chronic kidney disease with stage 5 chronic kidney disease or end stage renal disease: Secondary | ICD-10-CM | POA: Diagnosis not present

## 2020-03-06 DIAGNOSIS — R262 Difficulty in walking, not elsewhere classified: Secondary | ICD-10-CM | POA: Diagnosis not present

## 2020-03-06 DIAGNOSIS — Z8631 Personal history of diabetic foot ulcer: Secondary | ICD-10-CM | POA: Diagnosis not present

## 2020-03-06 DIAGNOSIS — Z794 Long term (current) use of insulin: Secondary | ICD-10-CM | POA: Diagnosis not present

## 2020-03-06 DIAGNOSIS — E1122 Type 2 diabetes mellitus with diabetic chronic kidney disease: Secondary | ICD-10-CM | POA: Diagnosis not present

## 2020-03-06 DIAGNOSIS — Z89431 Acquired absence of right foot: Secondary | ICD-10-CM | POA: Diagnosis not present

## 2020-03-07 DIAGNOSIS — N186 End stage renal disease: Secondary | ICD-10-CM | POA: Diagnosis not present

## 2020-03-07 DIAGNOSIS — Z992 Dependence on renal dialysis: Secondary | ICD-10-CM | POA: Diagnosis not present

## 2020-03-09 DIAGNOSIS — Z992 Dependence on renal dialysis: Secondary | ICD-10-CM | POA: Diagnosis not present

## 2020-03-09 DIAGNOSIS — N186 End stage renal disease: Secondary | ICD-10-CM | POA: Diagnosis not present

## 2020-03-12 DIAGNOSIS — N186 End stage renal disease: Secondary | ICD-10-CM | POA: Diagnosis not present

## 2020-03-12 DIAGNOSIS — Z992 Dependence on renal dialysis: Secondary | ICD-10-CM | POA: Diagnosis not present

## 2020-03-13 ENCOUNTER — Ambulatory Visit (INDEPENDENT_AMBULATORY_CARE_PROVIDER_SITE_OTHER): Payer: Medicare Other | Admitting: Family

## 2020-03-13 ENCOUNTER — Encounter: Payer: Self-pay | Admitting: Family

## 2020-03-13 VITALS — Ht 68.0 in | Wt 167.0 lb

## 2020-03-13 DIAGNOSIS — I96 Gangrene, not elsewhere classified: Secondary | ICD-10-CM

## 2020-03-13 DIAGNOSIS — Z23 Encounter for immunization: Secondary | ICD-10-CM | POA: Diagnosis not present

## 2020-03-13 DIAGNOSIS — D510 Vitamin B12 deficiency anemia due to intrinsic factor deficiency: Secondary | ICD-10-CM | POA: Diagnosis not present

## 2020-03-13 NOTE — Progress Notes (Signed)
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  She has been using nitroglycerin patches and Iodosorb for her daily dressing changes.  She has been nonweightbearing in a wheelchair she continues to have some phantom pain in her great toe worse pain after dialysis Ortho Exam The proximal half of the incision is well-healed distally the incision had initially gaped open there is now a 2 cm in diameter ulcerated area this was debrided with a 10 blade knife of nonviable eschar today.  There is underlying granulation no active drainage no exposed bone or tendon  Visit Diagnoses: No diagnosis found.  Plan: Silvadene dressing changes daily continue nonweightbearing elevation for swelling she will follow-up in 3 weeks.  Discussed her phantom and nerve pain.  She was unable to tolerate an increase in her gabapentin at last visit.  We will hold off on changing her medications at this time  Follow-Up Instructions: Return in about 3 weeks (around 04/03/2020).   Imaging: No results found.  Orders:  No orders of the defined types were placed in this encounter.  No orders of the defined types were placed in this encounter.    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[place])    Cellulitis of right foot 01/05/2020   History of CVA (cerebrovascular accident) 01/05/2020   Thrombocytopenia ([place]) 01/05/2020   Diabetic ulcer of right foot associated with diabetes mellitus due to underlying condition ([place]) 01/05/2020   History of colonic polyps 04/01/2018   Acute bronchitis 10/15/2017   DM type 2, goal  A1c below 7    HCAP (healthcare-associated pneumonia) 05/29/2015   Hyperkalemia 05/29/2015   Diabetes mellitus with complication (HCC)    ESRD (end stage renal disease) on dialysis [facility])    Encounter for adequacy testing for hemodialysis ([place]) 01/17/2014   Mechanical complication of other vascular device, implant, and graft 01/17/2014   Disturbances of vision, late effect of stroke 11/03/2013   Right hemiparesis ([place]) 08/19/2013   CVA (cerebral infarction) 07/20/2013   Gastroparesis due to DM ([place]) 07/01/2013   End stage renal disease ([place]) 06/25/2013   Anemia 01/26/2013   DM W/NEURO MNFST, TYPE II, UNCONTROLLED 11/19/2006   Leukocytosis 11/19/2006   DEPENDENT EDEMA, LEGS, BILATERAL 11/19/2006   SINUS TACHYCARDIA 10/16/2006   Diabetes mellitus, type 2 ([place]) 05/20/2006   HLD (hyperlipidemia) 05/20/2006   SYNDROME, RESTLESS LEGS 05/20/2006   Hereditary and idiopathic peripheral neuropathy 05/20/2006   Hypertension associated with diabetes ([place]) 05/20/2006   Past Medical History:  Diagnosis Date   Anemia    Cataract    Chronic kidney disease    CVA (cerebral infarction)    Diabetes mellitus    Type 2   Diabetes mellitus without complication ([place])    Dialysis patient [facility])    Dialysis patient ([place])    M, W, F   Fistula    R arm   GERD (gastroesophageal reflux disease)    Hypertension    Renal disorder  Shortness of breath    Stroke [facility])    right side weakness    Family History  Problem Relation Age of Onset   Cancer Sister    Cancer Brother    Anesthesia problems Neg Hx    Hypotension Neg Hx    Malignant hyperthermia Neg Hx    Pseudochol deficiency Neg Hx     Past Surgical History:  Procedure Laterality Date   AMPUTATION Right 01/13/2020   Procedure: AMPUTATION RIGHT FOOT 1ST RAY;  Surgeon: Newt Minion, MD;  Location: [place];  Service: Orthopedics;  Laterality: Right;   AV FISTULA PLACEMENT Right 09/08/2013    Procedure: CREATION OF RIGHT BRACHIAL CEPHALIC ARTERIOVENOUS FISTULA ;  Surgeon: Mal Misty, MD;  Location: [place];  Service: Vascular;  Laterality: Right;   [place] Right 01/26/2014   Procedure: Right Arm BASILIC VEIN TRANSPOSITION;  Surgeon: Mal Misty, MD;  Location: [place];  Service: Vascular;  Laterality: Right;   CATARACT EXTRACTION W/PHACO  11/20/2011   Procedure: CATARACT EXTRACTION PHACO AND INTRAOCULAR LENS PLACEMENT ([place]);  Surgeon: Tonny Branch, MD;  [facility];  Service: Ophthalmology;  Laterality: Right;  CDE 18.82   CATARACT EXTRACTION W/PHACO Left 11/18/2012   Procedure: CATARACT EXTRACTION PHACO AND INTRAOCULAR LENS PLACEMENT (IOC);  Surgeon: Tonny Branch, MD;  [facility]: [facility];  Service: Ophthalmology;  Laterality: Left;  CDE: 18.97   COLONOSCOPY N/A 02/09/2013   Procedure: COLONOSCOPY;  Surgeon: Rogene Houston, MD;  [facility];  Service: Endoscopy;  Laterality: N/A;  305-moved to 220 Ann to notify pt   COLONOSCOPY N/A 07/08/2018   Procedure: COLONOSCOPY;  Surgeon: Rogene Houston, MD;  [facility];  Service: Endoscopy;  Laterality: N/A;  930   INSERTION OF DIALYSIS CATHETER Right 06/24/2013   Procedure: INSERTION OF DIALYSIS CATHETER: Ultrasound guided;  Surgeon: Serafina Mitchell, MD;  Location: [place];  Service: Vascular;  Laterality: Right;   LIGATION OF ARTERIOVENOUS  FISTULA Right 01/26/2014   Procedure: LIGATION OF ARTERIOVENOUS  FISTULA;  Surgeon: Mal Misty, MD;  Location: [place];  Service: Vascular;  Laterality: Right;   LOOP RECORDER IMPLANT  07-21-13   MDT LinQ implanted by Dr Lovena Le for cryptogenic stroke   LOOP RECORDER IMPLANT N/A 07/21/2013   Procedure: LOOP RECORDER IMPLANT;  Surgeon: Evans Lance, MD;  Location: [facility] C[facility] LAB;  Service: Cardiovascular;  Laterality: N/A;   POLYPECTOMY  07/08/2018   Procedure: POLYPECTOMY;  Surgeon: Rogene Houston, MD;  [facility];  Service: Endoscopy;;  Descending  colon polyps x 2    TEE WITHOUT CARDIOVERSION N/A 07/21/2013   Procedure: TRANSESOPHAGEAL ECHOCARDIOGRAM (TEE);  Surgeon: Dorothy Spark, MD;  Location: [facility] ENDOSCOPY;  Service: Cardiovascular;  Laterality: N/A;   Social History   Occupational History   Not on file  Tobacco Use   Smoking status: Never Smoker   Smokeless tobacco: Never Used  Vaping Use   Vaping Use: Never used  Substance and Sexual Activity   Alcohol use: No   Drug use: No   Sexual activity: Not on file

## 2020-03-14 ENCOUNTER — Other Ambulatory Visit: Payer: Self-pay | Admitting: Physician Assistant

## 2020-03-14 ENCOUNTER — Telehealth: Payer: Self-pay

## 2020-03-14 ENCOUNTER — Telehealth: Payer: Self-pay | Admitting: Orthopedic Surgery

## 2020-03-14 DIAGNOSIS — Z992 Dependence on renal dialysis: Secondary | ICD-10-CM | POA: Diagnosis not present

## 2020-03-14 DIAGNOSIS — N186 End stage renal disease: Secondary | ICD-10-CM | POA: Diagnosis not present

## 2020-03-14 MED ORDER — OXYCODONE HCL 5 MG PO TABS
5.0000 mg | ORAL_TABLET | Freq: Three times a day (TID) | ORAL | 0 refills | Status: DC | PRN
Start: 2020-03-14 — End: 2020-03-15

## 2020-03-14 MED ORDER — NITROGLYCERIN 0.2 MG/HR TD PT24
0.2000 mg | MEDICATED_PATCH | Freq: Every day | TRANSDERMAL | 12 refills | Status: DC
Start: 2020-03-14 — End: 2020-03-16

## 2020-03-14 MED ORDER — SILVER SULFADIAZINE 1 % EX CREA: 1.0000 "application " | TOPICAL_CREAM | Freq: Every day | CUTANEOUS | 0 refills | Status: DC

## 2020-03-14 NOTE — Telephone Encounter (Signed)
See other note sent to you.

## 2020-03-14 NOTE — Telephone Encounter (Signed)
Patient called in for refill for oxycodone , nitrogycerin , silvadene cream

## 2020-03-14 NOTE — Telephone Encounter (Signed)
done

## 2020-03-14 NOTE — Telephone Encounter (Signed)
Patient's daughter called.   She is requesting the pain meds be called in   Call back: [phone]

## 2020-03-14 NOTE — Addendum Note (Signed)
Addended by: Dondra Prader R on: 03/14/2020 08:10 AM   Modules accepted: Orders

## 2020-03-14 NOTE — Telephone Encounter (Signed)
Pls advise.  

## 2020-03-15 ENCOUNTER — Other Ambulatory Visit: Payer: Self-pay | Admitting: Physician Assistant

## 2020-03-15 ENCOUNTER — Telehealth: Payer: Self-pay

## 2020-03-15 DIAGNOSIS — Z794 Long term (current) use of insulin: Secondary | ICD-10-CM | POA: Diagnosis not present

## 2020-03-15 DIAGNOSIS — R262 Difficulty in walking, not elsewhere classified: Secondary | ICD-10-CM | POA: Diagnosis not present

## 2020-03-15 DIAGNOSIS — E1122 Type 2 diabetes mellitus with diabetic chronic kidney disease: Secondary | ICD-10-CM | POA: Diagnosis not present

## 2020-03-15 DIAGNOSIS — I120 Hypertensive chronic kidney disease with stage 5 chronic kidney disease or end stage renal disease: Secondary | ICD-10-CM | POA: Diagnosis not present

## 2020-03-15 DIAGNOSIS — I70203 Unspecified atherosclerosis of native arteries of extremities, bilateral legs: Secondary | ICD-10-CM | POA: Diagnosis not present

## 2020-03-15 DIAGNOSIS — Z89431 Acquired absence of right foot: Secondary | ICD-10-CM | POA: Diagnosis not present

## 2020-03-15 DIAGNOSIS — Z8631 Personal history of diabetic foot ulcer: Secondary | ICD-10-CM | POA: Diagnosis not present

## 2020-03-15 DIAGNOSIS — Z4781 Encounter for orthopedic aftercare following surgical amputation: Secondary | ICD-10-CM | POA: Diagnosis not present

## 2020-03-15 DIAGNOSIS — N186 End stage renal disease: Secondary | ICD-10-CM | POA: Diagnosis not present

## 2020-03-15 DIAGNOSIS — Z992 Dependence on renal dialysis: Secondary | ICD-10-CM | POA: Diagnosis not present

## 2020-03-15 MED ORDER — OXYCODONE HCL 5 MG PO TABS
5.0000 mg | ORAL_TABLET | Freq: Three times a day (TID) | ORAL | 0 refills | Status: DC | PRN
Start: 2020-03-15 — End: 2020-10-27

## 2020-03-15 NOTE — Telephone Encounter (Signed)
done

## 2020-03-15 NOTE — Telephone Encounter (Signed)
Patient daughter calling in again fr refill says she was went to pharmacy and they do not have it . Wants to clairfy for refill to be sent to [place] in [place] on [address]

## 2020-03-16 ENCOUNTER — Other Ambulatory Visit: Payer: Self-pay | Admitting: Orthopedic Surgery

## 2020-03-16 ENCOUNTER — Telehealth: Payer: Self-pay | Admitting: Orthopedic Surgery

## 2020-03-16 DIAGNOSIS — Z992 Dependence on renal dialysis: Secondary | ICD-10-CM | POA: Diagnosis not present

## 2020-03-16 DIAGNOSIS — N186 End stage renal disease: Secondary | ICD-10-CM | POA: Diagnosis not present

## 2020-03-16 MED ORDER — NITROGLYCERIN 0.2 MG/HR TD PT24
0.2000 mg | MEDICATED_PATCH | Freq: Every day | TRANSDERMAL | 12 refills | Status: DC
Start: 2020-03-16 — End: 2020-04-24

## 2020-03-16 NOTE — Telephone Encounter (Signed)
Like to have this sent in and a CB when that's been done  [phone]

## 2020-03-16 NOTE — Telephone Encounter (Signed)
Prescription was sent in [facility] for [facility] on [address] in [place].  Prescription was also sent 3 days ago.  Both prescriptions have 12 refills

## 2020-03-16 NOTE — Telephone Encounter (Signed)
Pt called stating she was missing her nitroglycerin patches for her feet and would

## 2020-03-19 DIAGNOSIS — Z992 Dependence on renal dialysis: Secondary | ICD-10-CM | POA: Diagnosis not present

## 2020-03-19 DIAGNOSIS — N186 End stage renal disease: Secondary | ICD-10-CM | POA: Diagnosis not present

## 2020-03-20 ENCOUNTER — Telehealth: Payer: Self-pay

## 2020-03-20 DIAGNOSIS — N186 End stage renal disease: Secondary | ICD-10-CM | POA: Diagnosis not present

## 2020-03-20 DIAGNOSIS — Z4781 Encounter for orthopedic aftercare following surgical amputation: Secondary | ICD-10-CM | POA: Diagnosis not present

## 2020-03-20 DIAGNOSIS — Z89431 Acquired absence of right foot: Secondary | ICD-10-CM | POA: Diagnosis not present

## 2020-03-20 DIAGNOSIS — R262 Difficulty in walking, not elsewhere classified: Secondary | ICD-10-CM | POA: Diagnosis not present

## 2020-03-20 DIAGNOSIS — Z8631 Personal history of diabetic foot ulcer: Secondary | ICD-10-CM | POA: Diagnosis not present

## 2020-03-20 DIAGNOSIS — I120 Hypertensive chronic kidney disease with stage 5 chronic kidney disease or end stage renal disease: Secondary | ICD-10-CM | POA: Diagnosis not present

## 2020-03-20 DIAGNOSIS — I70203 Unspecified atherosclerosis of native arteries of extremities, bilateral legs: Secondary | ICD-10-CM | POA: Diagnosis not present

## 2020-03-20 DIAGNOSIS — Z794 Long term (current) use of insulin: Secondary | ICD-10-CM | POA: Diagnosis not present

## 2020-03-20 DIAGNOSIS — E1122 Type 2 diabetes mellitus with diabetic chronic kidney disease: Secondary | ICD-10-CM | POA: Diagnosis not present

## 2020-03-20 NOTE — Telephone Encounter (Signed)
Elta Guadeloupe called stating we could disregard his previous message; some had faxed in the rx last week   [facility] CB# [phone]

## 2020-03-20 NOTE — Telephone Encounter (Signed)
Mark from [facility] called he is requesting a prescription for a off loading shoe. Call back:[phone]

## 2020-03-21 DIAGNOSIS — Z992 Dependence on renal dialysis: Secondary | ICD-10-CM | POA: Diagnosis not present

## 2020-03-21 DIAGNOSIS — N186 End stage renal disease: Secondary | ICD-10-CM | POA: Diagnosis not present

## 2020-03-22 ENCOUNTER — Telehealth: Payer: Self-pay

## 2020-03-22 DIAGNOSIS — R262 Difficulty in walking, not elsewhere classified: Secondary | ICD-10-CM | POA: Diagnosis not present

## 2020-03-22 DIAGNOSIS — Z89431 Acquired absence of right foot: Secondary | ICD-10-CM | POA: Diagnosis not present

## 2020-03-22 DIAGNOSIS — I120 Hypertensive chronic kidney disease with stage 5 chronic kidney disease or end stage renal disease: Secondary | ICD-10-CM | POA: Diagnosis not present

## 2020-03-22 DIAGNOSIS — Z4781 Encounter for orthopedic aftercare following surgical amputation: Secondary | ICD-10-CM | POA: Diagnosis not present

## 2020-03-22 DIAGNOSIS — I70203 Unspecified atherosclerosis of native arteries of extremities, bilateral legs: Secondary | ICD-10-CM | POA: Diagnosis not present

## 2020-03-22 DIAGNOSIS — Z8631 Personal history of diabetic foot ulcer: Secondary | ICD-10-CM | POA: Diagnosis not present

## 2020-03-22 DIAGNOSIS — Z794 Long term (current) use of insulin: Secondary | ICD-10-CM | POA: Diagnosis not present

## 2020-03-22 DIAGNOSIS — E1122 Type 2 diabetes mellitus with diabetic chronic kidney disease: Secondary | ICD-10-CM | POA: Diagnosis not present

## 2020-03-22 DIAGNOSIS — N186 End stage renal disease: Secondary | ICD-10-CM | POA: Diagnosis not present

## 2020-03-22 NOTE — Telephone Encounter (Signed)
I called and lm on vm to advise that we saw the pt 03/13/20 advised to continue with silvadene dressing changes to the foot and follow up in the office 04/03/20 s/p 1st ray amputation. To call with questions.

## 2020-03-22 NOTE — Telephone Encounter (Signed)
Kathlee Nations, RN with [name] would like wound care orders for patient and reason for amputation?  CB# [phone].  Please advise.  Thank you.

## 2020-03-23 ENCOUNTER — Emergency Department (HOSPITAL_COMMUNITY): Payer: Medicare Other

## 2020-03-23 ENCOUNTER — Other Ambulatory Visit: Payer: Self-pay

## 2020-03-23 ENCOUNTER — Encounter (HOSPITAL_COMMUNITY): Payer: Self-pay

## 2020-03-23 ENCOUNTER — Emergency Department (HOSPITAL_COMMUNITY)
Admission: EM | Admit: 2020-03-23 | Discharge: 2020-03-24 | Disposition: A | Discharge status: Home / Self Care | Payer: Medicare Other | Attending: Emergency Medicine | Admitting: Emergency Medicine

## 2020-03-23 DIAGNOSIS — E1143 Type 2 diabetes mellitus with diabetic autonomic (poly)neuropathy: Secondary | ICD-10-CM | POA: Diagnosis not present

## 2020-03-23 DIAGNOSIS — Z794 Long term (current) use of insulin: Secondary | ICD-10-CM | POA: Insufficient documentation

## 2020-03-23 DIAGNOSIS — E1149 Type 2 diabetes mellitus with other diabetic neurological complication: Secondary | ICD-10-CM | POA: Insufficient documentation

## 2020-03-23 DIAGNOSIS — R112 Nausea with vomiting, unspecified: Secondary | ICD-10-CM | POA: Diagnosis not present

## 2020-03-23 DIAGNOSIS — N186 End stage renal disease: Secondary | ICD-10-CM | POA: Diagnosis not present

## 2020-03-23 DIAGNOSIS — R1110 Vomiting, unspecified: Secondary | ICD-10-CM | POA: Diagnosis not present

## 2020-03-23 DIAGNOSIS — R079 Chest pain, unspecified: Secondary | ICD-10-CM | POA: Diagnosis not present

## 2020-03-23 DIAGNOSIS — Z992 Dependence on renal dialysis: Secondary | ICD-10-CM | POA: Insufficient documentation

## 2020-03-23 DIAGNOSIS — Z79899 Other long term (current) drug therapy: Secondary | ICD-10-CM | POA: Insufficient documentation

## 2020-03-23 DIAGNOSIS — I517 Cardiomegaly: Secondary | ICD-10-CM | POA: Diagnosis not present

## 2020-03-23 DIAGNOSIS — R1013 Epigastric pain: Secondary | ICD-10-CM | POA: Diagnosis not present

## 2020-03-23 DIAGNOSIS — I120 Hypertensive chronic kidney disease with stage 5 chronic kidney disease or end stage renal disease: Secondary | ICD-10-CM | POA: Insufficient documentation

## 2020-03-23 DIAGNOSIS — Z7982 Long term (current) use of aspirin: Secondary | ICD-10-CM | POA: Insufficient documentation

## 2020-03-23 DIAGNOSIS — E1122 Type 2 diabetes mellitus with diabetic chronic kidney disease: Secondary | ICD-10-CM | POA: Insufficient documentation

## 2020-03-23 LAB — CBC
HCT: 34.7 % — ABNORMAL LOW (ref 36.0–46.0)
Hemoglobin: 10.9 g/dL — ABNORMAL LOW (ref 12.0–15.0)
MCH: 29.8 pg (ref 26.0–34.0)
MCHC: 31.4 g/dL (ref 30.0–36.0)
MCV: 94.8 fL (ref 80.0–100.0)
Platelets: 161 10*3/uL (ref 150–400)
RBC: 3.66 MIL/uL — ABNORMAL LOW (ref 3.87–5.11)
RDW: 16.4 % — ABNORMAL HIGH (ref 11.5–15.5)
WBC: 8.7 10*3/uL (ref 4.0–10.5)
nRBC: 0 % (ref 0.0–0.2)

## 2020-03-23 LAB — COMPREHENSIVE METABOLIC PANEL
ALT: 14 U/L (ref 0–44)
AST: 20 U/L (ref 15–41)
Albumin: 3.5 g/dL (ref 3.5–5.0)
Alkaline Phosphatase: 73 U/L (ref 38–126)
Anion gap: 11 (ref 5–15)
BUN: 16 mg/dL (ref 8–23)
CO2: 31 mmol/L (ref 22–32)
Calcium: 8.7 mg/dL — ABNORMAL LOW (ref 8.9–10.3)
Chloride: 95 mmol/L — ABNORMAL LOW (ref 98–111)
Creatinine, Ser: 3.65 mg/dL — ABNORMAL HIGH (ref 0.44–1.00)
GFR, Estimated: 11 mL/min — ABNORMAL LOW (ref >60)
Glucose, Bld: 239 mg/dL — ABNORMAL HIGH (ref 70–99)
Potassium: 4.3 mmol/L (ref 3.5–5.1)
Sodium: 137 mmol/L (ref 135–145)
Total Bilirubin: 0.7 mg/dL (ref 0.3–1.2)
Total Protein: 8.4 g/dL — ABNORMAL HIGH (ref 6.5–8.1)

## 2020-03-23 LAB — TROPONIN I (HIGH SENSITIVITY)
Troponin I (High Sensitivity): 15 ng/L (ref <18)
Troponin I (High Sensitivity): 17 ng/L (ref <18)

## 2020-03-23 LAB — LIPASE, BLOOD: Lipase: 29 U/L (ref 11–51)

## 2020-03-23 MED ORDER — MORPHINE SULFATE (PF) 4 MG/ML IV SOLN
4.0000 mg | Freq: Once | INTRAVENOUS | Status: DC
  Filled 2020-03-23: qty 1

## 2020-03-23 MED ORDER — IOHEXOL 300 MG/ML  SOLN
100.0000 mL | Freq: Once | INTRAMUSCULAR | Status: AC | PRN
  Administered 2020-03-23: 100 mL via INTRAVENOUS

## 2020-03-23 MED ORDER — ONDANSETRON HCL 4 MG/2ML IJ SOLN
4.0000 mg | Freq: Once | INTRAMUSCULAR | Status: AC
  Administered 2020-03-23: 4 mg via INTRAVENOUS
  Filled 2020-03-23: qty 2

## 2020-03-23 NOTE — ED Provider Notes (Signed)
[facility] EMERGENCY DEPARTMENT Provider Note   CSN: [id number] Arrival date & time: 03/23/20  1741     History Chief Complaint  Patient presents with  . Chest Pain    [name] is a 79 y.o. female with a past medical history of end-stage renal disease on hemodialysis Monday Wednesday and Friday, history of type 2 diabetes, hypertension, previous stroke with right-sided hemiparesis who presents emergency department with chief complaint of vomiting. Patient states that she has been having on and off chest pain that is worse when she lies flat, better when she sits up and leans forward for the past 2 days. She states it is worse at night and wakes her from sleep. Today she was at hemodialysis and she had onset of the chest pain. She was given Tums without relief. She had one episode of vomiting while she was there. She states that she had about 4 more episodes of vomiting when she went home. She states that the pain began to travel down toward her umbilicus. She has never had anything like this before, except she states when she was very young and pregnant. She denies a history of heart attack or abdominal surgeries. The pain is severe at times. She still feels some nausea. She denies diaphoresis, chest pressure or shortness of breath. She was able to complete her hemodialysis today.  HPI     Past Medical History:  Diagnosis Date  . Anemia   . Cataract   . Chronic kidney disease   . CVA (cerebral infarction)   . Diabetes mellitus    Type 2  . Diabetes mellitus without complication ([place])   . Dialysis patient ([place])   . Dialysis patient ([place])    M, W, F  . Fistula    R arm  . GERD (gastroesophageal reflux disease)   . Hypertension   . Renal disorder   . Shortness of breath   . Stroke [facility])    right side weakness    Patient Active Problem List   Diagnosis Date Noted  . Gangrene of toe of right foot ([place])   . Cellulitis of right foot 01/05/2020  . History of CVA  (cerebrovascular accident) 01/05/2020  . Thrombocytopenia ([place]) 01/05/2020  . 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 History of colonic polyps 04/01/2018  . Acute bronchitis 10/15/2017  . DM type 2, goal A1c below 7   . HCAP (healthcare-associated pneumonia) 05/29/2015  . Hyperkalemia 05/29/2015  . Diabetes mellitus with complication ([place])   . ESRD (end stage renal disease) on dialysis ([place])   . Encounter for adequacy testing for hemodialysis ([place]) 01/17/2014  . Mechanical complication of other vascular device, implant, and graft 01/17/2014  . Disturbances of vision, late effect of stroke 11/03/2013  . Right hemiparesis ([place]) 08/19/2013  . CVA (cerebral infarction) 07/20/2013  . Gastroparesis due to DM ([place]) 07/01/2013  . End stage renal disease ([place]) 06/25/2013  . Anemia 01/26/2013  . DM W/NEURO MNFST, TYPE II, UNCONTROLLED 11/19/2006  . Leukocytosis 11/19/2006  . DEPENDENT EDEMA, LEGS, BILATERAL 11/19/2006  . SINUS TACHYCARDIA 10/16/2006  . Diabetes mellitus, type 2 ([place]) 05/20/2006  . HLD (hyperlipidemia) 05/20/2006  . SYNDROME, RESTLESS LEGS 05/20/2006  . Hereditary and idiopathic peripheral neuropathy 05/20/2006  . Hypertension associated with diabetes ([place]) 05/20/2006    Past Surgical History:  Procedure Laterality Date  . AMPUTATION Right 01/13/2020   Procedure: AMPUTATION RIGHT FOOT 1ST RAY;  Surgeon:  Newt Minion, MD;  Location: [place];  Service: Orthopedics;  Laterality: Right;  . AV FISTULA PLACEMENT Right 09/08/2013   Procedure: CREATION OF RIGHT BRACHIAL CEPHALIC ARTERIOVENOUS FISTULA ;  Surgeon: Mal Misty, MD;  Location: [place];  Service: Vascular;  Laterality: Right;  . BASCILIC VEIN TRANSPOSITION Right 01/26/2014   Procedure: Right Arm BASILIC VEIN TRANSPOSITION;  Surgeon: Mal Misty, MD;  Location: [place];  Service: Vascular;  Laterality: Right;  . CATARACT EXTRACTION W/PHACO  11/20/2011    Procedure: CATARACT EXTRACTION PHACO AND INTRAOCULAR LENS PLACEMENT ([facility]);  Surgeon: Tonny Branch, MD;  [facility];  Service: Ophthalmology;  Laterality: Right;  CDE 18.82  . CATARACT EXTRACTION W/PHACO Left 11/18/2012   Procedure: CATARACT EXTRACTION PHACO AND INTRAOCULAR LENS PLACEMENT (IOC);  Surgeon: Tonny Branch, MD;  Location: [facility];  Service: Ophthalmology;  Laterality: Left;  CDE: 18.97  . COLONOSCOPY N/A 02/09/2013   Procedure: COLONOSCOPY;  Surgeon: Rogene Houston, MD;  [facility];  Service: Endoscopy;  Laterality: N/A;  305-moved to 220 Ann to notify pt  . COLONOSCOPY N/A 07/08/2018   Procedure: COLONOSCOPY;  Surgeon: Rogene Houston, MD;  [facility];  Service: Endoscopy;  Laterality: N/A;  930  . INSERTION OF DIALYSIS CATHETER Right 06/24/2013   Procedure: INSERTION OF DIALYSIS CATHETER: Ultrasound guided;  Surgeon: Serafina Mitchell, MD;  Location: [place];  Service: Vascular;  Laterality: Right;  . LIGATION OF ARTERIOVENOUS  FISTULA Right 01/26/2014   Procedure: LIGATION OF ARTERIOVENOUS  FISTULA;  Surgeon: Mal Misty, MD;  Location: [place];  Service: Vascular;  Laterality: Right;  . 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 LOOP RECORDER IMPLANT N/A 07/21/2013   Procedure: LOOP RECORDER IMPLANT;  Surgeon: Evans Lance, MD;  Location: [facility] CATH LAB;  Service: Cardiovascular;  Laterality: N/A;  . POLYPECTOMY  07/08/2018   Procedure: POLYPECTOMY;  Surgeon: Rogene Houston, MD;  [facility];  Service: Endoscopy;;  Descending colon polyps x 2   . TEE WITHOUT CARDIOVERSION N/A 07/21/2013   Procedure: TRANSESOPHAGEAL ECHOCARDIOGRAM (TEE);  Surgeon: Dorothy Spark, MD;  Location: [facility] ENDOSCOPY;  Service: Cardiovascular;  Laterality: N/A;     OB History    Gravida  0   Para  0   Term  0   Preterm  0   AB  0   Living        SAB  0   TAB  0   Ectopic  0   Multiple      Live Births               Family History  Problem Relation Age of Onset  . Cancer Sister   . Cancer Brother   . Anesthesia problems Neg Hx   . Hypotension Neg Hx   . Malignant hyperthermia Neg Hx   . Pseudochol deficiency Neg Hx     Social History   Tobacco Use  . Smoking status: Never Smoker  . Smokeless tobacco: Never Used  Vaping Use  . Vaping Use: Never used  Substance Use Topics  . Alcohol use: No  . Drug use: No    Home Medications Prior to Admission medications   Medication Sig Start Date End Date Taking? Authorizing Provider  albuterol (PROVENTIL HFA;VENTOLIN HFA) 108 (90 Base) MCG/ACT inhaler Inhale 2 puffs into the lungs every 6 (six) hours as needed for wheezing or  shortness of breath. 10/16/17   Kathie Dike, MD  aspirin 81 MG chewable tablet Chew 2 tablets (162 mg total) by mouth daily. 07/09/18   Rogene Houston, MD  BD INSULIN SYRINGE U/F 31G X 5/16" 0.3 ML MISC 2 (two) times daily. as directed 09/15/18   [provider]  cyanocobalamin (,VITAMIN B-12,) 1000 MCG/ML injection Inject 1 mL into the muscle every 30 (thirty) days. 11/01/14   [provider]  gabapentin (NEURONTIN) 300 MG capsule Take 300 mg by mouth daily.  10/29/18   [provider]  insulin NPH Human (NOVOLIN N) 100 UNIT/ML injection Inject 0.07 mLs (7 Units total) into the skin 2 (two) times daily before a meal. 01/17/20   Donne Hazel, MD  labetalol (NORMODYNE) 100 MG tablet Take 1 tablet (100 mg total) by mouth 2 (two) times daily. 01/17/20 02/16/20  Donne Hazel, MD  levETIRAcetam (KEPPRA) 250 MG tablet Take 250 mg by mouth 2 (two) times daily.     [provider]  lidocaine-prilocaine (EMLA) cream Apply 1 application topically every Monday, Wednesday, and Friday.  07/01/15   [provider]  multivitamin (RENA-VIT) TABS tablet Take 1 tablet by mouth at bedtime. 08/21/14   Kirsteins, Luanna Salk, MD  nitroGLYCERIN (NITRODUR - DOSED IN MG/24 HR) 0.2 mg/hr patch Place 1 patch  (0.2 mg total) onto the skin daily. 03/16/20   Newt Minion, MD  oxyCODONE (OXY IR/ROXICODONE) 5 MG immediate release tablet Take 1 tablet (5 mg total) by mouth every 8 (eight) hours as needed for severe pain. 03/15/20   Persons, Bevely Palmer, PA  pentoxifylline (TRENTAL) 400 MG CR tablet Take 1 tablet (400 mg total) by mouth daily with breakfast. 02/22/20   Persons, Bevely Palmer, PA  sevelamer carbonate (RENVELA) 800 MG tablet Take 800 mg by mouth 3 (three) times daily with meals.  12/24/17   [provider]  silver sulfADIAZINE (SILVADENE) 1 % cream Apply 1 application topically daily. 03/14/20   Suzan Slick, NP    Allergies    Ambien [zolpidem] and Reglan [metoclopramide]  Review of Systems   Review of Systems Ten systems reviewed and are negative for acute change, except as noted in the HPI.   Physical Exam Updated Vital Signs BP (!) 193/81   Pulse 87   Temp 98.6 F (37 C) (Oral)   Resp (!) 29   Ht 5\' 8"  (1.727 m)   Wt 78.9 kg   SpO2 99%   BMI 26.46 kg/m   Physical Exam Vitals and nursing note reviewed.  Constitutional:      General: She is not in acute distress.    Appearance: She is well-developed. She is not diaphoretic.  HENT:     Head: Normocephalic and atraumatic.  Eyes:     General: No scleral icterus.    Conjunctiva/sclera: Conjunctivae normal.  Cardiovascular:     Rate and Rhythm: Normal rate and regular rhythm.     Heart sounds: Normal heart sounds. No murmur heard.  No friction rub. No gallop.   Pulmonary:     Effort: Pulmonary effort is normal. No respiratory distress.     Breath sounds: Normal breath sounds.  Abdominal:     General: Bowel sounds are normal. There is no distension.     Palpations: Abdomen is soft. There is no mass.     Tenderness: There is abdominal tenderness in the epigastric area. There is no guarding.    Musculoskeletal:     Cervical back: Normal  range of motion.  Skin:    General: Skin is warm and dry.  Neurological:      Mental Status: She is alert and oriented to person, place, and time.  Psychiatric:        Behavior: Behavior normal.     ED Results / Procedures / Treatments   Labs (all labs ordered are listed, but only abnormal results are displayed) Labs Reviewed  CBC - Abnormal; Notable for the following components:      Result Value   RBC 3.66 (*)    Hemoglobin 10.9 (*)    HCT 34.7 (*)    RDW 16.4 (*)    All other components within normal limits  COMPREHENSIVE METABOLIC PANEL  LIPASE, BLOOD  URINALYSIS, ROUTINE W REFLEX MICROSCOPIC  TROPONIN I (HIGH SENSITIVITY)    EKG EKG Interpretation  Date/Time:  Friday March 23 2020 18:16:42 EDT Ventricular Rate:  88 PR Interval:    QRS Duration: 92 QT Interval:  360 QTC Calculation: 436 R Axis:   36 Text Interpretation: Sinus rhythm Baseline wander in lead(s) I aVR V2 V5 V6 since last tracing no significant change Confirmed by Daleen Bo [phone]) on 03/23/2020 6:21:14 PM   Radiology DG Chest Portable 1 View  Result Date: 03/23/2020 CLINICAL DATA:  Chest pain EXAM: PORTABLE CHEST 1 VIEW COMPARISON:  01/05/2020 FINDINGS: Left-sided recording device over the chest. Vascular stent in the right upper extremity. Cardiomegaly without acute pulmonary opacity, pleural effusion or pneumothorax. Stable mild diffuse chronic interstitial opacity. No pneumothorax. IMPRESSION: Cardiomegaly without edema or infiltrate. Electronically Signed   By: Donavan Foil M.D.   On: 03/23/2020 18:54    Procedures Procedures (including critical care time)  Medications Ordered in ED Medications  morphine 4 MG/ML injection 4 mg (has no administration in time range)  ondansetron (ZOFRAN) injection 4 mg (has no administration in time range)    ED Course  I have reviewed the triage vital signs and the nursing notes.  Pertinent labs & imaging results that were available during my care of the patient were reviewed by me and considered in my medical decision making  (see chart for details).    MDM Rules/Calculators/A&P                          79 year old female here with complaint of vomiting and abdominal pain along with chest pain. The emergent differential diagnosis for vomiting includes, but is not limited to ACS/MI, Boerhaave's, DKA, Intracranial Hemorrhage, Ischemic bowel, Meningitis, Sepsis, Acute radiation syndrome, Acute gastric dilation, Acetaminophen toxicity, Adrenal insufficiency, Appendicitis, Aspirin toxicity, Bowel obstruction/ileus, Carbon monoxide poisoning, Cholecystitis, CNS tumor. Digoxin toxicity, Electrolyte abnormalities, Elevated ICP, Gastric outlet obstruction, Hyperemesis gravidarum, Pancreatitis, Peritonitis, Ruptured viscus, Testicular torsion/ovarian torsion, Theophyline toxicity, Biliary colic, Cannabinoid hyperemesis syndrome, Chemotherapy, Disulfiram effect, Erythromycin, ETOH, Gastritis, Gastroenteritis, Gastroparesis, Hepatitis, Ibuprofen, Ipecac toxicity, Labyrinthitis, Migraine, Motion sickness, Narcotic withdrawal, Thyroid, Pregnancy, Peptic ulcer disease, Renal colic, and UTI Ordered and reviewed labs which shows 2 - troponins, lipase within normal limits, no elevated white blood cell count, mild ascitic anemia likely of chronic disease.  CMP she is elevated blood glucose, elevated creatinine consistent with history of end-stage renal disease.  Patient has been persistently hypertensive here.  I personally ordered and reviewed a 1 view chest x-ray which shows no evidence of edema or infiltrates.  EKG shows sinus rhythm at a rate of 88.  Currently awaiting CT abdomen and pelvis imaging.  I have given signout to Dr. Sedonia Small  who is assumed care of the patient.  No active vomiting here in the emergency department.  Pain well controlled. Final Clinical Impression(s) / ED Diagnoses Final diagnoses:  None    Rx / DC Orders ED Discharge Orders    None       Margarita Mail, PA-C 03/23/20 2305    Daleen Bo, MD 03/23/20  [phone]

## 2020-03-23 NOTE — ED Triage Notes (Signed)
PT reports chest and epigastric pain, and n/v x 2 days.  Reports had dialysis this morning.  PT says had some toes removed from r foot approx 1 month ago.

## 2020-03-24 MED ORDER — SUCRALFATE 1 G PO TABS: 1.0000 g | ORAL_TABLET | Freq: Four times a day (QID) | ORAL | 0 refills | Status: DC | PRN

## 2020-03-24 MED ORDER — OMEPRAZOLE 20 MG PO CPDR: 20.0000 mg | DELAYED_RELEASE_CAPSULE | Freq: Every day | ORAL | 1 refills | Status: DC

## 2020-03-24 NOTE — ED Provider Notes (Signed)
°  Provider Note MRN:  [id number]  Arrival date & time: 03/24/20    ED Course and Medical Decision Making  Assumed care from PA Harris at shift change.  CT abdomen is without acute process.  Patient remains hypertensive but this seems to be at or near her baseline.  She is in no acute distress, no symptoms at this time.  Pain worse when lying flat, suspect acid reflux, will trial PPI and Carafate and advised PCP follow-up.  Strict return precautions.  Procedures  Final Clinical Impressions(s) / ED Diagnoses     ICD-10-CM   1. Epigastric pain  R10.13     ED Discharge Orders         Ordered    omeprazole (PRILOSEC) 20 MG capsule  Daily        03/24/20 0006    sucralfate (CARAFATE) 1 g tablet  4 times daily PRN        03/24/20 0006            Discharge Instructions     You were evaluated in the Emergency Department and after careful evaluation, we did not find any emergent condition requiring admission or further testing in the hospital.  Your exam/testing today was overall reassuring.  Your symptoms seem to be due to acid reflux.  As discussed, please take the medications prescribed and follow-up with your regular doctor.  Please return to the Emergency Department if you experience any worsening of your condition.  Thank you for allowing [place] to be a part of your care.     Barth Kirks. [place] Small, [place] [email or web address]    Maudie Flakes, MD 03/24/20 [phone]

## 2020-03-24 NOTE — Discharge Instructions (Addendum)
You were evaluated in the Emergency Department and after careful evaluation, we did not find any emergent condition requiring admission or further testing in the hospital.  Your exam/testing today was overall reassuring.  Your symptoms seem to be due to acid reflux.  As discussed, please take the medications prescribed and follow-up with your regular doctor.  Please return to the Emergency Department if you experience any worsening of your condition.  Thank you for allowing [place] to be a part of your care.

## 2020-03-26 DIAGNOSIS — Z992 Dependence on renal dialysis: Secondary | ICD-10-CM | POA: Diagnosis not present

## 2020-03-26 DIAGNOSIS — E119 Type 2 diabetes mellitus without complications: Secondary | ICD-10-CM | POA: Diagnosis not present

## 2020-03-26 DIAGNOSIS — N186 End stage renal disease: Secondary | ICD-10-CM | POA: Diagnosis not present

## 2020-03-26 DIAGNOSIS — N2581 Secondary hyperparathyroidism of renal origin: Secondary | ICD-10-CM | POA: Diagnosis not present

## 2020-03-26 DIAGNOSIS — D509 Iron deficiency anemia, unspecified: Secondary | ICD-10-CM | POA: Diagnosis not present

## 2020-03-27 DIAGNOSIS — R262 Difficulty in walking, not elsewhere classified: Secondary | ICD-10-CM | POA: Diagnosis not present

## 2020-03-27 DIAGNOSIS — Z89431 Acquired absence of right foot: Secondary | ICD-10-CM | POA: Diagnosis not present

## 2020-03-27 DIAGNOSIS — Z794 Long term (current) use of insulin: Secondary | ICD-10-CM | POA: Diagnosis not present

## 2020-03-27 DIAGNOSIS — I70203 Unspecified atherosclerosis of native arteries of extremities, bilateral legs: Secondary | ICD-10-CM | POA: Diagnosis not present

## 2020-03-27 DIAGNOSIS — N186 End stage renal disease: Secondary | ICD-10-CM | POA: Diagnosis not present

## 2020-03-27 DIAGNOSIS — I120 Hypertensive chronic kidney disease with stage 5 chronic kidney disease or end stage renal disease: Secondary | ICD-10-CM | POA: Diagnosis not present

## 2020-03-27 DIAGNOSIS — E1122 Type 2 diabetes mellitus with diabetic chronic kidney disease: Secondary | ICD-10-CM | POA: Diagnosis not present

## 2020-03-27 DIAGNOSIS — Z8631 Personal history of diabetic foot ulcer: Secondary | ICD-10-CM | POA: Diagnosis not present

## 2020-03-27 DIAGNOSIS — Z4781 Encounter for orthopedic aftercare following surgical amputation: Secondary | ICD-10-CM | POA: Diagnosis not present

## 2020-03-28 DIAGNOSIS — N186 End stage renal disease: Secondary | ICD-10-CM | POA: Diagnosis not present

## 2020-03-28 DIAGNOSIS — Z992 Dependence on renal dialysis: Secondary | ICD-10-CM | POA: Diagnosis not present

## 2020-03-29 DIAGNOSIS — R262 Difficulty in walking, not elsewhere classified: Secondary | ICD-10-CM | POA: Diagnosis not present

## 2020-03-29 DIAGNOSIS — E1122 Type 2 diabetes mellitus with diabetic chronic kidney disease: Secondary | ICD-10-CM | POA: Diagnosis not present

## 2020-03-29 DIAGNOSIS — Z8631 Personal history of diabetic foot ulcer: Secondary | ICD-10-CM | POA: Diagnosis not present

## 2020-03-29 DIAGNOSIS — Z89431 Acquired absence of right foot: Secondary | ICD-10-CM | POA: Diagnosis not present

## 2020-03-29 DIAGNOSIS — N186 End stage renal disease: Secondary | ICD-10-CM | POA: Diagnosis not present

## 2020-03-29 DIAGNOSIS — I70203 Unspecified atherosclerosis of native arteries of extremities, bilateral legs: Secondary | ICD-10-CM | POA: Diagnosis not present

## 2020-03-29 DIAGNOSIS — Z794 Long term (current) use of insulin: Secondary | ICD-10-CM | POA: Diagnosis not present

## 2020-03-29 DIAGNOSIS — I120 Hypertensive chronic kidney disease with stage 5 chronic kidney disease or end stage renal disease: Secondary | ICD-10-CM | POA: Diagnosis not present

## 2020-03-29 DIAGNOSIS — Z4781 Encounter for orthopedic aftercare following surgical amputation: Secondary | ICD-10-CM | POA: Diagnosis not present

## 2020-03-30 DIAGNOSIS — N186 End stage renal disease: Secondary | ICD-10-CM | POA: Diagnosis not present

## 2020-03-30 DIAGNOSIS — Z992 Dependence on renal dialysis: Secondary | ICD-10-CM | POA: Diagnosis not present

## 2020-04-02 DIAGNOSIS — N186 End stage renal disease: Secondary | ICD-10-CM | POA: Diagnosis not present

## 2020-04-02 DIAGNOSIS — Z992 Dependence on renal dialysis: Secondary | ICD-10-CM | POA: Diagnosis not present

## 2020-04-03 ENCOUNTER — Encounter: Payer: Self-pay | Admitting: Physician Assistant

## 2020-04-03 ENCOUNTER — Ambulatory Visit (INDEPENDENT_AMBULATORY_CARE_PROVIDER_SITE_OTHER): Payer: Medicare Other | Admitting: Physician Assistant

## 2020-04-03 VITALS — Ht 68.0 in | Wt 174.0 lb

## 2020-04-03 DIAGNOSIS — E118 Type 2 diabetes mellitus with unspecified complications: Secondary | ICD-10-CM

## 2020-04-03 NOTE — Progress Notes (Signed)
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 She has been slow to heal. She has been having daily Silvadene dressing changes that are done by her daughter and a nurse a couple times a week. She feels it is looking a little better and denies any significant pain  Assessment & Plan: Visit Diagnoses: No diagnosis found.  Plan: I had a discussion with the daughter she said that her mom has not seen Dr. Sharol Given since surgery. I do think the wound looks a bit wet today and I put a silver cell patch and hoping that the nurse at least twice a week could do this or alternate the days with Silvadene. Continue to use the nitroglycerin patch. We will follow up in 3 weeks with Dr. Sharol Given daughter was in agreement with this plan  Follow-Up Instructions: No follow-ups on file.   Ortho Exam  Patient is alert, oriented, no adenopathy, well-dressed, normal affect, normal respiratory effort. Focused examination of her right foot demonstrates a mix of fibrinous tissue about 50-50 with fibrinous tissue and hyper granulation tissue. She did have with debridement some good healthy bleeding that was coagulated with a silver nitrate stick as well as the areas of hyper granulation were touched with this as well. Nothing probes deeply and there is no surrounding cellulitis.  Imaging: No results found. No images are attached to the encounter.  Labs: Lab Results  Component Value Date   HGBA1C 7.7 (H) 01/05/2020   HGBA1C 7.5 (H) 10/14/2017   HGBA1C 9.8 (H) 06/01/2015   ESRSEDRATE 42 (H)  01/06/2020   CRP 20.3 (H) 01/06/2020   REPTSTATUS 01/10/2020 FINAL 01/05/2020   CULT  01/05/2020    NO GROWTH 5 DAYS Performed at [facility], [place] [address] [place], [place] [address]      Lab Results  Component Value Date   ALBUMIN 3.5 03/23/2020   ALBUMIN 2.5 (L) 01/16/2020   ALBUMIN 2.9 (L) 01/06/2020    Lab Results  Component Value Date   MG 1.9 06/26/2013   MG 1.7 01/25/2009   No results found for: VD25OH  No results found for: PREALBUMIN CBC EXTENDED Latest Ref Rng & Units 03/23/2020 01/16/2020 01/13/2020  WBC 4.0 - 10.5 K/uL 8.7 17.4(H) -  RBC 3.87 - 5.11 MIL/uL 3.66(L) 2.99(L) -  HGB 12.0 - 15.0 g/dL 10.9(L) 9.2(L) 13.9  HCT 36 - 46 % 34.7(L) 27.1(L) 41.0  PLT 150 - 400 K/uL 161 161 -  NEUTROABS 1.7 - 7.7 K/uL - - -  LYMPHSABS 0.7 - 4.0 K/uL - - -     Body mass index is 26.46 kg/m.  Orders:  No orders of the defined types were placed in this encounter.  No orders of the defined types were placed in this encounter.    Procedures: No procedures performed  Clinical Data: No additional findings.  ROS:  All other systems negative, except as noted in the HPI. Review of Systems  Objective: Vital Signs: Ht 5\' 8"  (1.727 m)    Wt 174 lb (78.9 kg)    BMI 26.46 kg/m   Specialty Comments:  No specialty comments available.  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[place])    Cellulitis of right foot 01/05/2020   History of CVA (cerebrovascular accident) 01/05/2020   Thrombocytopenia ([place]) 01/05/2020   Diabetic ulcer of right foot associated with diabetes mellitus due to underlying condition ([place]) 01/05/2020   History of colonic polyps 04/01/2018   Acute bronchitis 10/15/2017   DM type 2, goal A1c below 7    HCAP (healthcare-associated pneumonia) 05/29/2015   Hyperkalemia 05/29/2015   Diabetes mellitus with complication (HCC)    ESRD (end stage renal disease) on dialysis [facility])     Encounter for adequacy testing for hemodialysis ([place]) 01/17/2014   Mechanical complication of other vascular device, implant, and graft 01/17/2014   Disturbances of vision, late effect of stroke 11/03/2013   Right hemiparesis ([place]) 08/19/2013   CVA (cerebral infarction) 07/20/2013   Gastroparesis due to DM ([place]) 07/01/2013   End stage renal disease ([place]) 06/25/2013   Anemia 01/26/2013   DM W/NEURO MNFST, TYPE II, UNCONTROLLED 11/19/2006   Leukocytosis 11/19/2006   DEPENDENT EDEMA, LEGS, BILATERAL 11/19/2006   SINUS TACHYCARDIA 10/16/2006   Diabetes mellitus, type 2 ([place]) 05/20/2006   HLD (hyperlipidemia) 05/20/2006   SYNDROME, RESTLESS LEGS 05/20/2006   Hereditary and idiopathic peripheral neuropathy 05/20/2006   Hypertension associated with diabetes ([place]) 05/20/2006   Past Medical History:  Diagnosis Date   Anemia    Cataract    Chronic kidney disease    CVA (cerebral infarction)    Diabetes mellitus    Type 2   Diabetes mellitus without complication ([place])    Dialysis patient [facility])    Dialysis patient ([place])    M, W, F   Fistula    R arm   GERD (gastroesophageal reflux disease)    Hypertension    Renal disorder    Shortness of breath    Stroke ([place])    right side weakness    Family History  Problem Relation Age of Onset   Cancer Sister    Cancer Brother    Anesthesia problems Neg Hx    Hypotension Neg Hx    Malignant hyperthermia Neg Hx    Pseudochol deficiency Neg Hx     Past Surgical History:  Procedure Laterality Date   AMPUTATION Right 01/13/2020   Procedure: AMPUTATION RIGHT FOOT 1ST RAY;  Surgeon: Newt Minion, MD;  Location: [place];  Service: Orthopedics;  Laterality: Right;   AV FISTULA PLACEMENT Right 09/08/2013   Procedure: CREATION OF RIGHT BRACHIAL CEPHALIC ARTERIOVENOUS FISTULA ;  Surgeon: Mal Misty, MD;  Location: [place];  Service: Vascular;  Laterality: Right;   [place] Right  01/26/2014   Procedure: Right Arm BASILIC VEIN TRANSPOSITION;  Surgeon: Mal Misty, MD;  Location: [place];  Service: Vascular;  Laterality: Right;   CATARACT EXTRACTION W/PHACO  11/20/2011   Procedure: CATARACT EXTRACTION PHACO AND INTRAOCULAR LENS PLACEMENT ([place]);  Surgeon: Tonny Branch, MD;  [facility];  Service: Ophthalmology;  Laterality: Right;  CDE 18.82   CATARACT EXTRACTION W/PHACO Left 11/18/2012   Procedure: CATARACT EXTRACTION PHACO AND INTRAOCULAR LENS PLACEMENT (I[facility]);  Surgeon: Tonny Branch, MD;  [facility];  Service:  Ophthalmology;  Laterality: Left;  CDE: 18.97   COLONOSCOPY N/A 02/09/2013   Procedure: COLONOSCOPY;  Surgeon: Rogene Houston, MD;  [facility];  Service: Endoscopy;  Laterality: N/A;  305-moved to 220 Ann to notify pt   COLONOSCOPY N/A 07/08/2018   Procedure: COLONOSCOPY;  Surgeon: Rogene Houston, MD;  [facility];  Service: Endoscopy;  Laterality: N/A;  930   INSERTION OF DIALYSIS CATHETER Right 06/24/2013   Procedure: INSERTION OF DIALYSIS CATHETER: Ultrasound guided;  Surgeon: Serafina Mitchell, MD;  Location: [place];  Service: Vascular;  Laterality: Right;   LIGATION OF ARTERIOVENOUS  FISTULA Right 01/26/2014   Procedure: LIGATION OF ARTERIOVENOUS  FISTULA;  Surgeon: Mal Misty, MD;  Location: [place];  Service: Vascular;  Laterality: Right;   LOOP RECORDER IMPLANT  07-21-13   MDT LinQ implanted by Dr Lovena Le for cryptogenic stroke   LOOP RECORDER IMPLANT N/A 07/21/2013   Procedure: LOOP RECORDER IMPLANT;  Surgeon: Evans Lance, MD;  Location: [facility] CATH LAB;  Service: Cardiovascular;  Laterality: N/A;   POLYPECTOMY  07/08/2018   Procedure: POLYPECTOMY;  Surgeon: Rogene Houston, MD;  [facility];  Service: Endoscopy;;  Descending colon polyps x 2    TEE WITHOUT CARDIOVERSION N/A 07/21/2013   Procedure: TRANSESOPHAGEAL ECHOCARDIOGRAM (TEE);  Surgeon: Dorothy Spark, MD;  Location: [facility] ENDOSCOPY;  Service: Cardiovascular;   Laterality: N/A;   Social History   Occupational History   Not on file  Tobacco Use   Smoking status: Never Smoker   Smokeless tobacco: Never Used  Vaping Use   Vaping Use: Never used  Substance and Sexual Activity   Alcohol use: No   Drug use: No   Sexual activity: Not on file

## 2020-04-04 DIAGNOSIS — N186 End stage renal disease: Secondary | ICD-10-CM | POA: Diagnosis not present

## 2020-04-04 DIAGNOSIS — Z992 Dependence on renal dialysis: Secondary | ICD-10-CM | POA: Diagnosis not present

## 2020-04-05 DIAGNOSIS — E1122 Type 2 diabetes mellitus with diabetic chronic kidney disease: Secondary | ICD-10-CM | POA: Diagnosis not present

## 2020-04-05 DIAGNOSIS — I120 Hypertensive chronic kidney disease with stage 5 chronic kidney disease or end stage renal disease: Secondary | ICD-10-CM | POA: Diagnosis not present

## 2020-04-05 DIAGNOSIS — I70203 Unspecified atherosclerosis of native arteries of extremities, bilateral legs: Secondary | ICD-10-CM | POA: Diagnosis not present

## 2020-04-05 DIAGNOSIS — R262 Difficulty in walking, not elsewhere classified: Secondary | ICD-10-CM | POA: Diagnosis not present

## 2020-04-05 DIAGNOSIS — Z8631 Personal history of diabetic foot ulcer: Secondary | ICD-10-CM | POA: Diagnosis not present

## 2020-04-05 DIAGNOSIS — N186 End stage renal disease: Secondary | ICD-10-CM | POA: Diagnosis not present

## 2020-04-05 DIAGNOSIS — Z794 Long term (current) use of insulin: Secondary | ICD-10-CM | POA: Diagnosis not present

## 2020-04-05 DIAGNOSIS — Z4781 Encounter for orthopedic aftercare following surgical amputation: Secondary | ICD-10-CM | POA: Diagnosis not present

## 2020-04-05 DIAGNOSIS — Z89431 Acquired absence of right foot: Secondary | ICD-10-CM | POA: Diagnosis not present

## 2020-04-06 DIAGNOSIS — N186 End stage renal disease: Secondary | ICD-10-CM | POA: Diagnosis not present

## 2020-04-06 DIAGNOSIS — Z992 Dependence on renal dialysis: Secondary | ICD-10-CM | POA: Diagnosis not present

## 2020-04-09 DIAGNOSIS — N186 End stage renal disease: Secondary | ICD-10-CM | POA: Diagnosis not present

## 2020-04-09 DIAGNOSIS — Z992 Dependence on renal dialysis: Secondary | ICD-10-CM | POA: Diagnosis not present

## 2020-04-11 DIAGNOSIS — Z992 Dependence on renal dialysis: Secondary | ICD-10-CM | POA: Diagnosis not present

## 2020-04-11 DIAGNOSIS — N186 End stage renal disease: Secondary | ICD-10-CM | POA: Diagnosis not present

## 2020-04-12 ENCOUNTER — Ambulatory Visit
Admission: EM | Admit: 2020-04-12 | Discharge: 2020-04-12 | Disposition: A | Discharge status: Home / Self Care | Payer: Medicare Other | Attending: Emergency Medicine | Admitting: Emergency Medicine

## 2020-04-12 DIAGNOSIS — E1122 Type 2 diabetes mellitus with diabetic chronic kidney disease: Secondary | ICD-10-CM | POA: Diagnosis not present

## 2020-04-12 DIAGNOSIS — Z794 Long term (current) use of insulin: Secondary | ICD-10-CM | POA: Diagnosis not present

## 2020-04-12 DIAGNOSIS — I120 Hypertensive chronic kidney disease with stage 5 chronic kidney disease or end stage renal disease: Secondary | ICD-10-CM | POA: Diagnosis not present

## 2020-04-12 DIAGNOSIS — G5701 Lesion of sciatic nerve, right lower limb: Secondary | ICD-10-CM | POA: Diagnosis not present

## 2020-04-12 DIAGNOSIS — R262 Difficulty in walking, not elsewhere classified: Secondary | ICD-10-CM | POA: Diagnosis not present

## 2020-04-12 DIAGNOSIS — I70203 Unspecified atherosclerosis of native arteries of extremities, bilateral legs: Secondary | ICD-10-CM | POA: Diagnosis not present

## 2020-04-12 DIAGNOSIS — Z89431 Acquired absence of right foot: Secondary | ICD-10-CM | POA: Diagnosis not present

## 2020-04-12 DIAGNOSIS — Z4781 Encounter for orthopedic aftercare following surgical amputation: Secondary | ICD-10-CM | POA: Diagnosis not present

## 2020-04-12 DIAGNOSIS — Z8631 Personal history of diabetic foot ulcer: Secondary | ICD-10-CM | POA: Diagnosis not present

## 2020-04-12 DIAGNOSIS — N186 End stage renal disease: Secondary | ICD-10-CM | POA: Diagnosis not present

## 2020-04-12 MED ORDER — GABAPENTIN 100 MG PO CAPS: 100.0000 mg | ORAL_CAPSULE | Freq: Three times a day (TID) | ORAL | 0 refills | Status: DC

## 2020-04-12 NOTE — ED Triage Notes (Signed)
Pt presents with c/o right leg and hip pain and states that it radiates to back and under right breast for past month. Pt is post great toe amputation 2 months ago in affected leg

## 2020-04-12 NOTE — Discharge Instructions (Addendum)
Continue to take Tylenol 500 mg as prescribed for pain Gabapentin was prescribed take as directed Follow-up with PCP for further reevaluation Return or go to ED for worsening of symptoms

## 2020-04-12 NOTE — ED Provider Notes (Signed)
[facility]   [id number] 04/12/20 Arrival Time: 0830   Chief Complaint  Patient presents with  . Leg Pain     SUBJECTIVE: History from: patient and family.  [name] is a 79 y.o. female with history of diabetes type 2, kidney failure and on dialysis Monday, Wednesday and Friday presented to the urgent care with a complaint of back pain that radiates to the leg for the past few days.  Denies any precipitating event.  Reports she has a recent surgery with toe amputation.  She localizes the pain to the right low back, right hip and right leg.  She describes the pain as constant and achy.  She has tried OTC medications without relief.  Her symptoms are made worse with ROM.  She denies similar symptoms in the past.  Denies chills, fever, nausea, vomiting, diarrhea    ROS: As per HPI.  All other pertinent ROS negative.     Past Medical History:  Diagnosis Date  . Anemia   . Cataract   . Chronic kidney disease   . CVA (cerebral infarction)   . Diabetes mellitus    Type 2  . Diabetes mellitus without complication ([place])   . Dialysis patient ([place])   . Dialysis patient ([place])    M, W, F  . Fistula    R arm  . GERD (gastroesophageal reflux disease)   . Hypertension   . Renal disorder   . Shortness of breath   . Stroke [facility])    right side weakness   Past Surgical History:  Procedure Laterality Date  . AMPUTATION Right 01/13/2020   Procedure: AMPUTATION RIGHT FOOT 1ST RAY;  Surgeon: Newt Minion, MD;  Location: [place];  Service: Orthopedics;  Laterality: Right;  . AV FISTULA PLACEMENT Right 09/08/2013   Procedure: CREATION OF RIGHT BRACHIAL CEPHALIC ARTERIOVENOUS FISTULA ;  Surgeon: Mal Misty, MD;  Location: [place];  Service: Vascular;  Laterality: Right;  . BASCILIC VEIN TRANSPOSITION Right 01/26/2014   Procedure: Right Arm BASILIC VEIN TRANSPOSITION;  Surgeon: Mal Misty, MD;  Location: [place];  Service: Vascular;  Laterality: Right;  . CATARACT  EXTRACTION W/PHACO  11/20/2011   Procedure: CATARACT EXTRACTION PHACO AND INTRAOCULAR LENS PLACEMENT ([facility]);  Surgeon: Tonny Branch, MD;  [facility];  Service: Ophthalmology;  Laterality: Right;  CDE 18.82  . CATARACT EXTRACTION W/PHACO Left 11/18/2012   Procedure: CATARACT EXTRACTION PHACO AND INTRAOCULAR LENS PLACEMENT ([facility]);  Surgeon: Tonny Branch, MD;  [facility];  Service: Ophthalmology;  Laterality: Left;  CDE: 18.97  . COLONOSCOPY N/A 02/09/2013   Procedure: COLONOSCOPY;  Surgeon: Rogene Houston, MD;  [facility];  Service: Endoscopy;  Laterality: N/A;  305-moved to 220 Ann to notify pt  . COLONOSCOPY N/A 07/08/2018   Procedure: COLONOSCOPY;  Surgeon: Rogene Houston, MD;  [facility];  Service: Endoscopy;  Laterality: N/A;  930  . INSERTION OF DIALYSIS CATHETER Right 06/24/2013   Procedure: INSERTION OF DIALYSIS CATHETER: Ultrasound guided;  Surgeon: Serafina Mitchell, MD;  Location: [place];  Service: Vascular;  Laterality: Right;  . LIGATION OF ARTERIOVENOUS  FISTULA Right 01/26/2014   Procedure: LIGATION OF ARTERIOVENOUS  FISTULA;  Surgeon: Mal Misty, MD;  Location: [place];  Service: Vascular;  Laterality: Right;  . 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 LOOP RECORDER IMPLANT N/A 07/21/2013   Procedure: LOOP RECORDER IMPLANT;  Surgeon: Evans Lance, MD;  Location: [facility] CATH LAB;  Service: Cardiovascular;  Laterality: N/A;  . POLYPECTOMY  07/08/2018   Procedure: POLYPECTOMY;  Surgeon: Rogene Houston, MD;  [facility];  Service: Endoscopy;;  Descending colon polyps x 2   . TEE WITHOUT CARDIOVERSION N/A 07/21/2013   Procedure: TRANSESOPHAGEAL ECHOCARDIOGRAM (TEE);  Surgeon: Dorothy Spark, MD;  [facility];  Service: Cardiovascular;  Laterality: N/A;   Allergies  Allergen Reactions  . Ambien [Zolpidem] Other (See Comments)    Hallucinations   . Reglan [Metoclopramide] Other (See  Comments)    "makes me crazy"   No current facility-administered medications on file prior to encounter.   Current Outpatient Medications on File Prior to Encounter  Medication Sig Dispense Refill  . albuterol (PROVENTIL HFA;VENTOLIN HFA) 108 (90 Base) MCG/ACT inhaler Inhale 2 puffs into the lungs every 6 (six) hours as needed for wheezing or shortness of breath. 1 Inhaler 2  . aspirin 81 MG chewable tablet Chew 2 tablets (162 mg total) by mouth daily.    . BD INSULIN SYRINGE U/F 31G X 5/16" 0.3 ML MISC 2 (two) times daily. as directed    . cyanocobalamin (,VITAMIN B-12,) 1000 MCG/ML injection Inject 1 mL into the muscle every 30 (thirty) days.  11  . insulin NPH Human (NOVOLIN N) 100 UNIT/ML injection Inject 0.07 mLs (7 Units total) into the skin 2 (two) times daily before a meal. 10 mL 0  . labetalol (NORMODYNE) 100 MG tablet Take 1 tablet (100 mg total) by mouth 2 (two) times daily. 60 tablet 0  . labetalol (NORMODYNE) 200 MG tablet Take 200 mg by mouth 2 (two) times daily.    Marland Kitchen lidocaine-prilocaine (EMLA) cream Apply 1 application topically every Monday, Wednesday, and Friday.     . multivitamin (RENA-VIT) TABS tablet Take 1 tablet by mouth at bedtime. 30 tablet 0  . nitroGLYCERIN (NITRODUR - DOSED IN MG/24 HR) 0.2 mg/hr patch Place 1 patch (0.2 mg total) onto the skin daily. (Patient taking differently: Place 0.2 mg onto the skin daily. (For Foot)) 30 patch 12  . omeprazole (PRILOSEC) 20 MG capsule Take 1 capsule (20 mg total) by mouth daily. 30 capsule 1  . oxyCODONE (OXY IR/ROXICODONE) 5 MG immediate release tablet Take 1 tablet (5 mg total) by mouth every 8 (eight) hours as needed for severe pain. 30 tablet 0  . pentoxifylline (TRENTAL) 400 MG CR tablet Take 1 tablet (400 mg total) by mouth daily with breakfast. 30 tablet 0  . sevelamer carbonate (RENVELA) 800 MG tablet Take 800 mg by mouth 3 (three) times daily with meals.   11  . silver sulfADIAZINE (SILVADENE) 1 % cream Apply 1  application topically daily. 50 g 0  . sucralfate (CARAFATE) 1 g tablet Take 1 tablet (1 g total) by mouth 4 (four) times daily as needed. 30 tablet 0   Social History   Socioeconomic History  . Marital status: Widowed    Spouse name: Not on file  . Number of children: Not on file  . Years of education: Not on file  . Highest education level: Not on file  Occupational History  . Not on file  Tobacco Use  . Smoking status: Never Smoker  . Smokeless tobacco: Never Used  Vaping Use  . Vaping Use: Never used  Substance and Sexual Activity  . Alcohol use: No  . Drug use: No  . Sexual activity: Not on file  Other Topics Concern  .  Not on file  Social History Narrative   ** Merged History Encounter **       Social Determinants of Health   Financial Resource Strain:   . Difficulty of Paying Living Expenses: Not on file  Food Insecurity:   . Worried About Charity fundraiser in the Last Year: Not on file  . Ran Out of Food in the Last Year: Not on file  Transportation Needs:   . Lack of Transportation (Medical): Not on file  . Lack of Transportation (Non-Medical): Not on file  Physical Activity:   . Days of Exercise per Week: Not on file  . Minutes of Exercise per Session: Not on file  Stress:   . Feeling of Stress : Not on file  Social Connections:   . Frequency of Communication with Friends and Family: Not on file  . Frequency of Social Gatherings with Friends and Family: Not on file  . Attends Religious Services: Not on file  . Active Member of Clubs or Organizations: Not on file  . Attends Archivist Meetings: Not on file  . Marital Status: Not on file  Intimate Partner Violence:   . Fear of Current or Ex-Partner: Not on file  . Emotionally Abused: Not on file  . Physically Abused: Not on file  . Sexually Abused: Not on file   Family History  Problem Relation Age of Onset  . Cancer Sister   . Cancer Brother   . Anesthesia problems Neg Hx   .  Hypotension Neg Hx   . Malignant hyperthermia Neg Hx   . Pseudochol deficiency Neg Hx     OBJECTIVE:  Vitals:   04/12/20 0848  BP: (!) 184/81  Pulse: 71  Resp: 20  Temp: 98.3 F (36.8 C)  SpO2: 98%     Physical Exam Vitals and nursing note reviewed.  Constitutional:      General: She is not in acute distress.    Appearance: Normal appearance. She is normal weight. She is not ill-appearing, toxic-appearing or diaphoretic.  HENT:     Head: Normocephalic.  Cardiovascular:     Rate and Rhythm: Normal rate and regular rhythm.     Pulses: Normal pulses.     Heart sounds: Normal heart sounds. No murmur heard.  No friction rub. No gallop.   Pulmonary:     Effort: Pulmonary effort is normal. No respiratory distress.     Breath sounds: Normal breath sounds. No stridor. No wheezing, rhonchi or rales.  Chest:     Chest wall: No tenderness.  Musculoskeletal:        General: Tenderness present.     Lumbar back: Normal.     Comments: Back:  Patient ambulates from chair to exam table without difficulty.  Inspection: Skin clear and intact without obvious swelling, erythema, or ecchymosis. Warm to the touch  Palpation: Vertebral processes nontender.  Nontender on palpation   Neurological:     General: No focal deficit present.     Mental Status: She is alert and oriented to person, place, and time.     GCS: GCS eye subscore is 4. GCS verbal subscore is 5. GCS motor subscore is 6.     Cranial Nerves: Cranial nerves are intact.     Sensory: Sensory deficit present.     LABS:  No results found for this or any previous visit (from the past 24 hour(s)).   ASSESSMENT & PLAN:  1. Irritation of right sciatic nerve  Meds ordered this encounter  Medications  . gabapentin (NEURONTIN) 100 MG capsule    Sig: Take 1 capsule (100 mg total) by mouth 3 (three) times daily.    Dispense:  90 capsule    Refill:  0    Discharge instructions  Continue to take Tylenol 500 mg as  prescribed for pain Gabapentin was prescribed take as directed Follow-up with PCP for further reevaluation Return or go to ED for worsening of symptoms  Reviewed expectations re: course of current medical issues. Questions answered. Outlined signs and symptoms indicating need for more acute intervention. Patient verbalized understanding. After Visit Summary given.         Emerson Monte, [place] 04/12/20 [phone]

## 2020-04-13 DIAGNOSIS — Z992 Dependence on renal dialysis: Secondary | ICD-10-CM | POA: Diagnosis not present

## 2020-04-13 DIAGNOSIS — N186 End stage renal disease: Secondary | ICD-10-CM | POA: Diagnosis not present

## 2020-04-15 DIAGNOSIS — N186 End stage renal disease: Secondary | ICD-10-CM | POA: Diagnosis not present

## 2020-04-15 DIAGNOSIS — Z992 Dependence on renal dialysis: Secondary | ICD-10-CM | POA: Diagnosis not present

## 2020-04-16 DIAGNOSIS — Z992 Dependence on renal dialysis: Secondary | ICD-10-CM | POA: Diagnosis not present

## 2020-04-16 DIAGNOSIS — N186 End stage renal disease: Secondary | ICD-10-CM | POA: Diagnosis not present

## 2020-04-17 DIAGNOSIS — Z89431 Acquired absence of right foot: Secondary | ICD-10-CM | POA: Diagnosis not present

## 2020-04-17 DIAGNOSIS — N186 End stage renal disease: Secondary | ICD-10-CM | POA: Diagnosis not present

## 2020-04-17 DIAGNOSIS — Z4781 Encounter for orthopedic aftercare following surgical amputation: Secondary | ICD-10-CM | POA: Diagnosis not present

## 2020-04-17 DIAGNOSIS — E1122 Type 2 diabetes mellitus with diabetic chronic kidney disease: Secondary | ICD-10-CM | POA: Diagnosis not present

## 2020-04-17 DIAGNOSIS — Z8631 Personal history of diabetic foot ulcer: Secondary | ICD-10-CM | POA: Diagnosis not present

## 2020-04-17 DIAGNOSIS — I120 Hypertensive chronic kidney disease with stage 5 chronic kidney disease or end stage renal disease: Secondary | ICD-10-CM | POA: Diagnosis not present

## 2020-04-17 DIAGNOSIS — Z794 Long term (current) use of insulin: Secondary | ICD-10-CM | POA: Diagnosis not present

## 2020-04-17 DIAGNOSIS — I70203 Unspecified atherosclerosis of native arteries of extremities, bilateral legs: Secondary | ICD-10-CM | POA: Diagnosis not present

## 2020-04-17 DIAGNOSIS — R262 Difficulty in walking, not elsewhere classified: Secondary | ICD-10-CM | POA: Diagnosis not present

## 2020-04-18 DIAGNOSIS — Z992 Dependence on renal dialysis: Secondary | ICD-10-CM | POA: Diagnosis not present

## 2020-04-18 DIAGNOSIS — N186 End stage renal disease: Secondary | ICD-10-CM | POA: Diagnosis not present

## 2020-04-19 DIAGNOSIS — E1122 Type 2 diabetes mellitus with diabetic chronic kidney disease: Secondary | ICD-10-CM | POA: Diagnosis not present

## 2020-04-19 DIAGNOSIS — Z8631 Personal history of diabetic foot ulcer: Secondary | ICD-10-CM | POA: Diagnosis not present

## 2020-04-19 DIAGNOSIS — R262 Difficulty in walking, not elsewhere classified: Secondary | ICD-10-CM | POA: Diagnosis not present

## 2020-04-19 DIAGNOSIS — Z794 Long term (current) use of insulin: Secondary | ICD-10-CM | POA: Diagnosis not present

## 2020-04-19 DIAGNOSIS — Z89431 Acquired absence of right foot: Secondary | ICD-10-CM | POA: Diagnosis not present

## 2020-04-19 DIAGNOSIS — I70203 Unspecified atherosclerosis of native arteries of extremities, bilateral legs: Secondary | ICD-10-CM | POA: Diagnosis not present

## 2020-04-19 DIAGNOSIS — N186 End stage renal disease: Secondary | ICD-10-CM | POA: Diagnosis not present

## 2020-04-19 DIAGNOSIS — Z4781 Encounter for orthopedic aftercare following surgical amputation: Secondary | ICD-10-CM | POA: Diagnosis not present

## 2020-04-19 DIAGNOSIS — I120 Hypertensive chronic kidney disease with stage 5 chronic kidney disease or end stage renal disease: Secondary | ICD-10-CM | POA: Diagnosis not present

## 2020-04-20 DIAGNOSIS — N186 End stage renal disease: Secondary | ICD-10-CM | POA: Diagnosis not present

## 2020-04-20 DIAGNOSIS — Z992 Dependence on renal dialysis: Secondary | ICD-10-CM | POA: Diagnosis not present

## 2020-04-23 DIAGNOSIS — N186 End stage renal disease: Secondary | ICD-10-CM | POA: Diagnosis not present

## 2020-04-23 DIAGNOSIS — N2581 Secondary hyperparathyroidism of renal origin: Secondary | ICD-10-CM | POA: Diagnosis not present

## 2020-04-23 DIAGNOSIS — D509 Iron deficiency anemia, unspecified: Secondary | ICD-10-CM | POA: Diagnosis not present

## 2020-04-23 DIAGNOSIS — Z992 Dependence on renal dialysis: Secondary | ICD-10-CM | POA: Diagnosis not present

## 2020-04-24 ENCOUNTER — Ambulatory Visit: Payer: Medicare Other | Admitting: Orthopedic Surgery

## 2020-04-24 ENCOUNTER — Encounter: Payer: Self-pay | Admitting: Orthopedic Surgery

## 2020-04-24 VITALS — Ht 68.0 in | Wt 174.0 lb

## 2020-04-24 DIAGNOSIS — T8130XA Disruption of wound, unspecified, initial encounter: Secondary | ICD-10-CM | POA: Diagnosis not present

## 2020-04-24 DIAGNOSIS — Z89411 Acquired absence of right great toe: Secondary | ICD-10-CM | POA: Diagnosis not present

## 2020-04-24 DIAGNOSIS — D509 Iron deficiency anemia, unspecified: Secondary | ICD-10-CM | POA: Diagnosis not present

## 2020-04-24 DIAGNOSIS — I739 Peripheral vascular disease, unspecified: Secondary | ICD-10-CM | POA: Diagnosis not present

## 2020-04-24 MED ORDER — NITROGLYCERIN 0.2 MG/HR TD PT24
0.2000 mg | MEDICATED_PATCH | Freq: Every day | TRANSDERMAL | 3 refills | Status: DC
Start: 2020-04-24 — End: 2020-10-27

## 2020-04-24 NOTE — Progress Notes (Signed)
Office Visit Note   Patient: [name]           Date of Birth: Aug 23, 1940           MRN: [id number] Visit Date: 04/24/2020              Requested by: [name], [address] [place],  [place] [address] PCP: [name], MD  Chief Complaint  Patient presents with  . 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  She has been nonweightbearing.  Assessment & Plan: Visit Diagnoses: No diagnosis found.  Plan: Patient will continue nonweightbearing with dialysis continue with Silvadene dressing changes we will apply a Iodosorb dressing today.  Refill prescription for nitroglycerin patch sent to pharmacy  Follow-Up Instructions: Return in about 2 weeks (around 05/08/2020).   Ortho Exam  Patient is alert, oriented, no adenopathy, well-dressed, normal affect, normal respiratory effort. Examination the majority of the necrotic tissue was removed from the wound today.  There is hyper granulation tissue this was touched with silver nitrate there is no exposed bone or tendon.  The wound is 3 cm in diameter with hyper granulation tissue there is no cellulitis no odor no drainage no signs of infection.  Imaging: No results found. No images are attached to the encounter.  Labs: Lab Results  Component Value Date   HGBA1C 7.7 (H) 01/05/2020   HGBA1C 7.5 (H) 10/14/2017   HGBA1C 9.8 (H) 06/01/2015   ESRSEDRATE 42 (H) 01/06/2020   CRP 20.3 (H) 01/06/2020   REPTSTATUS 01/10/2020 FINAL 01/05/2020   CULT  01/05/2020    NO GROWTH 5 DAYS Performed at [facility], [place] [address] [place], [place] [address]      Lab Results  Component Value Date   ALBUMIN 3.5 03/23/2020   ALBUMIN 2.5 (L) 01/16/2020   ALBUMIN 2.9 (L) 01/06/2020    Lab  Results  Component Value Date   MG 1.9 06/26/2013   MG 1.7 01/25/2009   No results found for: VD25OH  No results found for: PREALBUMIN CBC EXTENDED Latest Ref Rng & Units 03/23/2020 01/16/2020 01/13/2020  WBC 4.0 - 10.5 K/uL 8.7 17.4(H) -  RBC 3.87 - 5.11 MIL/uL 3.66(L) 2.99(L) -  HGB 12.0 - 15.0 g/dL 10.9(L) 9.2(L) 13.9  HCT 36 - 46 % 34.7(L) 27.1(L) 41.0  PLT 150 - 400 K/uL 161 161 -  NEUTROABS 1.7 - 7.7 K/uL - - -  LYMPHSABS 0.7 - 4.0 K/uL - - -     Body mass index is 26.46 kg/m.  Orders:  No orders of the defined types were placed in this encounter.  Meds ordered this encounter  Medications  . nitroGLYCERIN (NITRODUR - DOSED IN MG/24 HR) 0.2 mg/hr patch    Sig: Place 1 patch (0.2 mg total) onto the skin daily. (For Foot)    Dispense:  30 patch    Refill:  3     Procedures: No procedures performed  Clinical Data: No additional findings.  ROS:  All other systems negative, except as noted in the HPI. Review of Systems  Objective: Vital Signs: Ht 5\' 8"  (1.727 m)   Wt 174 lb (78.9 kg)   BMI 26.46 kg/m   Specialty Comments:  No specialty  comments available.  PMFS History: Patient Active Problem List   Diagnosis Date Noted  . Gangrene of toe of right foot ([place])   . Cellulitis of right foot 01/05/2020  . History of CVA (cerebrovascular accident) 01/05/2020  . Thrombocytopenia ([place]) 01/05/2020  . Diabetic ulcer of right foot associated with diabetes mellitus due to underlying condition ([place]) 01/05/2020  . History of colonic polyps 04/01/2018  . Acute bronchitis 10/15/2017  . DM type 2, goal A1c below 7   . HCAP (healthcare-associated pneumonia) 05/29/2015  . Hyperkalemia 05/29/2015  . Diabetes mellitus with complication ([place])   . ESRD (end stage renal disease) on dialysis ([place])   . Encounter for adequacy testing for hemodialysis ([place]) 01/17/2014  . Mechanical complication of other vascular device, implant, and graft 01/17/2014  . Disturbances of vision,  late effect of stroke 11/03/2013  . Right hemiparesis ([place]) 08/19/2013  . CVA (cerebral infarction) 07/20/2013  . Gastroparesis due to DM ([place]) 07/01/2013  . End stage renal disease ([place]) 06/25/2013  . Anemia 01/26/2013  . DM W/NEURO MNFST, TYPE II, UNCONTROLLED 11/19/2006  . Leukocytosis 11/19/2006  . DEPENDENT EDEMA, LEGS, BILATERAL 11/19/2006  . SINUS TACHYCARDIA 10/16/2006  . Diabetes mellitus, type 2 ([place]) 05/20/2006  . HLD (hyperlipidemia) 05/20/2006  . SYNDROME, RESTLESS LEGS 05/20/2006  . Hereditary and idiopathic peripheral neuropathy 05/20/2006  . Hypertension associated with diabetes ([place]) 05/20/2006   Past Medical History:  Diagnosis Date  . Anemia   . Cataract   . Chronic kidney disease   . CVA (cerebral infarction)   . Diabetes mellitus    Type 2  . Diabetes mellitus without complication ([place])   . Dialysis patient ([place])   . Dialysis patient ([place])    M, W, F  . Fistula    R arm  . GERD (gastroesophageal reflux disease)   . Hypertension   . Renal disorder   . Shortness of breath   . Stroke [facility])    right side weakness    Family History  Problem Relation Age of Onset  . Cancer Sister   . Cancer Brother   . Anesthesia problems Neg Hx   . Hypotension Neg Hx   . Malignant hyperthermia Neg Hx   . Pseudochol deficiency Neg Hx     Past Surgical History:  Procedure Laterality Date  . AMPUTATION Right 01/13/2020   Procedure: AMPUTATION RIGHT FOOT 1ST RAY;  Surgeon: Newt Minion, MD;  Location: [facility];  Service: Orthopedics;  Laterality: Right;  . AV FISTULA PLACEMENT Right 09/08/2013   Procedure: CREATION OF RIGHT BRACHIAL CEPHALIC ARTERIOVENOUS FISTULA ;  Surgeon: Mal Misty, MD;  Location: [facility];  Service: Vascular;  Laterality: Right;  . BASCILIC VEIN TRANSPOSITION Right 01/26/2014   Procedure: Right Arm BASILIC VEIN TRANSPOSITION;  Surgeon: Mal Misty, MD;  Location: [facility];  Service: Vascular;  Laterality: Right;  . CATARACT EXTRACTION W/PHACO   11/20/2011   Procedure: CATARACT EXTRACTION PHACO AND INTRAOCULAR LENS PLACEMENT (IOC);  Surgeon: Tonny Branch, MD;  [facility];  Service: Ophthalmology;  Laterality: Right;  CDE 18.82  . CATARACT EXTRACTION W/PHACO Left 11/18/2012   Procedure: CATARACT EXTRACTION PHACO AND INTRAOCULAR LENS PLACEMENT (IOC);  Surgeon: Tonny Branch, MD;  [facility];  Service: Ophthalmology;  Laterality: Left;  CDE: 18.97  . COLONOSCOPY N/A 02/09/2013   Procedure: COLONOSCOPY;  Surgeon: Rogene Houston, MD;  [facility];  Service: Endoscopy;  Laterality: N/A;  305-moved to 220 Ann to notify pt  .  COLONOSCOPY N/A 07/08/2018   Procedure: COLONOSCOPY;  Surgeon: Rogene Houston, MD;  [facility];  Service: Endoscopy;  Laterality: N/A;  930  . INSERTION OF DIALYSIS CATHETER Right 06/24/2013   Procedure: INSERTION OF DIALYSIS CATHETER: Ultrasound guided;  Surgeon: Serafina Mitchell, MD;  Location: [facility] [place];  Service: Vascular;  Laterality: Right;  . LIGATION OF ARTERIOVENOUS  FISTULA Right 01/26/2014   Procedure: LIGATION OF ARTERIOVENOUS  FISTULA;  Surgeon: Mal Misty, MD;  Location: [place];  Service: Vascular;  Laterality: Right;  . 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 LOOP RECORDER IMPLANT N/A 07/21/2013   Procedure: LOOP RECORDER IMPLANT;  Surgeon: Evans Lance, MD;  Location: [facility];  Service: Cardiovascular;  Laterality: N/A;  . POLYPECTOMY  07/08/2018   Procedure: POLYPECTOMY;  Surgeon: Rogene Houston, MD;  [facility];  Service: Endoscopy;;  Descending colon polyps x 2   . TEE WITHOUT CARDIOVERSION N/A 07/21/2013   Procedure: TRANSESOPHAGEAL ECHOCARDIOGRAM (TEE);  Surgeon: Dorothy Spark, MD;  Location: [facility] ENDOSCOPY;  Service: Cardiovascular;  Laterality: N/A;   Social History   Occupational History  . Not on file  Tobacco Use  . Smoking status: Never Smoker  . Smokeless tobacco: Never Used  Vaping Use  . Vaping  Use: Never used  Substance and Sexual Activity  . Alcohol use: No  . Drug use: No  . Sexual activity: Not on file

## 2020-04-25 DIAGNOSIS — Z992 Dependence on renal dialysis: Secondary | ICD-10-CM | POA: Diagnosis not present

## 2020-04-25 DIAGNOSIS — N186 End stage renal disease: Secondary | ICD-10-CM | POA: Diagnosis not present

## 2020-04-26 DIAGNOSIS — E1122 Type 2 diabetes mellitus with diabetic chronic kidney disease: Secondary | ICD-10-CM | POA: Diagnosis not present

## 2020-04-26 DIAGNOSIS — Z89431 Acquired absence of right foot: Secondary | ICD-10-CM | POA: Diagnosis not present

## 2020-04-26 DIAGNOSIS — I120 Hypertensive chronic kidney disease with stage 5 chronic kidney disease or end stage renal disease: Secondary | ICD-10-CM | POA: Diagnosis not present

## 2020-04-26 DIAGNOSIS — Z794 Long term (current) use of insulin: Secondary | ICD-10-CM | POA: Diagnosis not present

## 2020-04-26 DIAGNOSIS — Z8631 Personal history of diabetic foot ulcer: Secondary | ICD-10-CM | POA: Diagnosis not present

## 2020-04-26 DIAGNOSIS — I70203 Unspecified atherosclerosis of native arteries of extremities, bilateral legs: Secondary | ICD-10-CM | POA: Diagnosis not present

## 2020-04-26 DIAGNOSIS — N186 End stage renal disease: Secondary | ICD-10-CM | POA: Diagnosis not present

## 2020-04-26 DIAGNOSIS — Z23 Encounter for immunization: Secondary | ICD-10-CM | POA: Diagnosis not present

## 2020-04-26 DIAGNOSIS — Z4781 Encounter for orthopedic aftercare following surgical amputation: Secondary | ICD-10-CM | POA: Diagnosis not present

## 2020-04-26 DIAGNOSIS — R262 Difficulty in walking, not elsewhere classified: Secondary | ICD-10-CM | POA: Diagnosis not present

## 2020-04-26 DIAGNOSIS — E519 Thiamine deficiency, unspecified: Secondary | ICD-10-CM | POA: Diagnosis not present

## 2020-04-27 DIAGNOSIS — N186 End stage renal disease: Secondary | ICD-10-CM | POA: Diagnosis not present

## 2020-04-27 DIAGNOSIS — Z992 Dependence on renal dialysis: Secondary | ICD-10-CM | POA: Diagnosis not present

## 2020-04-30 DIAGNOSIS — Z992 Dependence on renal dialysis: Secondary | ICD-10-CM | POA: Diagnosis not present

## 2020-04-30 DIAGNOSIS — N186 End stage renal disease: Secondary | ICD-10-CM | POA: Diagnosis not present

## 2020-05-01 DIAGNOSIS — E1122 Type 2 diabetes mellitus with diabetic chronic kidney disease: Secondary | ICD-10-CM | POA: Diagnosis not present

## 2020-05-01 DIAGNOSIS — I70203 Unspecified atherosclerosis of native arteries of extremities, bilateral legs: Secondary | ICD-10-CM | POA: Diagnosis not present

## 2020-05-01 DIAGNOSIS — Z4781 Encounter for orthopedic aftercare following surgical amputation: Secondary | ICD-10-CM | POA: Diagnosis not present

## 2020-05-01 DIAGNOSIS — Z794 Long term (current) use of insulin: Secondary | ICD-10-CM | POA: Diagnosis not present

## 2020-05-01 DIAGNOSIS — R262 Difficulty in walking, not elsewhere classified: Secondary | ICD-10-CM | POA: Diagnosis not present

## 2020-05-01 DIAGNOSIS — Z8631 Personal history of diabetic foot ulcer: Secondary | ICD-10-CM | POA: Diagnosis not present

## 2020-05-01 DIAGNOSIS — I120 Hypertensive chronic kidney disease with stage 5 chronic kidney disease or end stage renal disease: Secondary | ICD-10-CM | POA: Diagnosis not present

## 2020-05-01 DIAGNOSIS — N186 End stage renal disease: Secondary | ICD-10-CM | POA: Diagnosis not present

## 2020-05-01 DIAGNOSIS — Z89431 Acquired absence of right foot: Secondary | ICD-10-CM | POA: Diagnosis not present

## 2020-05-02 DIAGNOSIS — Z992 Dependence on renal dialysis: Secondary | ICD-10-CM | POA: Diagnosis not present

## 2020-05-02 DIAGNOSIS — N186 End stage renal disease: Secondary | ICD-10-CM | POA: Diagnosis not present

## 2020-05-03 DIAGNOSIS — Z4781 Encounter for orthopedic aftercare following surgical amputation: Secondary | ICD-10-CM | POA: Diagnosis not present

## 2020-05-03 DIAGNOSIS — N186 End stage renal disease: Secondary | ICD-10-CM | POA: Diagnosis not present

## 2020-05-03 DIAGNOSIS — Z89431 Acquired absence of right foot: Secondary | ICD-10-CM | POA: Diagnosis not present

## 2020-05-03 DIAGNOSIS — R262 Difficulty in walking, not elsewhere classified: Secondary | ICD-10-CM | POA: Diagnosis not present

## 2020-05-03 DIAGNOSIS — Z794 Long term (current) use of insulin: Secondary | ICD-10-CM | POA: Diagnosis not present

## 2020-05-03 DIAGNOSIS — I120 Hypertensive chronic kidney disease with stage 5 chronic kidney disease or end stage renal disease: Secondary | ICD-10-CM | POA: Diagnosis not present

## 2020-05-03 DIAGNOSIS — I70203 Unspecified atherosclerosis of native arteries of extremities, bilateral legs: Secondary | ICD-10-CM | POA: Diagnosis not present

## 2020-05-03 DIAGNOSIS — E1122 Type 2 diabetes mellitus with diabetic chronic kidney disease: Secondary | ICD-10-CM | POA: Diagnosis not present

## 2020-05-03 DIAGNOSIS — Z8631 Personal history of diabetic foot ulcer: Secondary | ICD-10-CM | POA: Diagnosis not present

## 2020-05-04 DIAGNOSIS — N186 End stage renal disease: Secondary | ICD-10-CM | POA: Diagnosis not present

## 2020-05-04 DIAGNOSIS — Z992 Dependence on renal dialysis: Secondary | ICD-10-CM | POA: Diagnosis not present

## 2020-05-07 DIAGNOSIS — Z992 Dependence on renal dialysis: Secondary | ICD-10-CM | POA: Diagnosis not present

## 2020-05-07 DIAGNOSIS — N186 End stage renal disease: Secondary | ICD-10-CM | POA: Diagnosis not present

## 2020-05-08 ENCOUNTER — Encounter: Payer: Self-pay | Admitting: Orthopedic Surgery

## 2020-05-08 ENCOUNTER — Ambulatory Visit (INDEPENDENT_AMBULATORY_CARE_PROVIDER_SITE_OTHER): Payer: Medicare Other | Admitting: Physician Assistant

## 2020-05-08 VITALS — Ht 68.0 in | Wt 174.0 lb

## 2020-05-08 DIAGNOSIS — E08621 Diabetes mellitus due to underlying condition with foot ulcer: Secondary | ICD-10-CM

## 2020-05-08 DIAGNOSIS — L97411 Non-pressure chronic ulcer of right heel and midfoot limited to breakdown of skin: Secondary | ICD-10-CM

## 2020-05-08 NOTE — Progress Notes (Signed)
Office Visit Note   Patient: [name]           Date of Birth: 09/26/1940           MRN: [id number] Visit Date: 05/08/2020              Requested by: [name], [address] [place],  [place] [address] PCP: [name], MD  Chief Complaint  Patient presents with  . 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  She is 3 months status post right foot first ray amputation.  She has been doing Silvadene dressing changes.  At her last visit granulation tissue that was touched with Silvadene stick.  She also is using her nitroglycerin patch she does feel she has had a little slow improvement  Assessment & Plan: Visit Diagnoses: No diagnosis found.  Plan: Was dressed with Iodosorb and a dry dressing follow-up in 3-week  Follow-Up Instructions: No follow-ups on file.   Ortho Exam  Patient is alert, oriented, no adenopathy, well-dressed, normal affect, normal respiratory effort. Focused examination of her right foot demonstrates wound appears overall healthy there is hyper granulation tissue no exposed bone or tendon.  Wound is approximately 2-1/2 cm with hyper granulation tissue there is no cellulitis odor drainage or signs of infection  Imaging: No results found. No images are attached to the encounter.  Labs: Lab Results  Component Value Date   HGBA1C 7.7 (H) 01/05/2020   HGBA1C 7.5 (H) 10/14/2017   HGBA1C 9.8 (H) 06/01/2015   ESRSEDRATE 42 (H) 01/06/2020   CRP 20.3 (H) 01/06/2020   REPTSTATUS 01/10/2020 FINAL 01/05/2020   CULT  01/05/2020    NO GROWTH 5 DAYS Performed at [facility], [place] [address], [place], [place] [address]      Lab Results  Component Value Date   ALBUMIN 3.5 03/23/2020   ALBUMIN 2.5 (L) 01/16/2020   ALBUMIN 2.9 (L) 01/06/2020    Lab Results  Component Value Date   MG 1.9 06/26/2013   MG 1.7 01/25/2009   No results found for: VD25OH  No results  found for: PREALBUMIN CBC EXTENDED Latest Ref Rng & Units 03/23/2020 01/16/2020 01/13/2020  WBC 4.0 - 10.5 K/uL 8.7 17.4(H) -  RBC 3.87 - 5.11 MIL/uL 3.66(L) 2.99(L) -  HGB 12.0 - 15.0 g/dL 10.9(L) 9.2(L) 13.9  HCT 36 - 46 % 34.7(L) 27.1(L) 41.0  PLT 150 - 400 K/uL 161 161 -  NEUTROABS 1.7 - 7.7 K/uL - - -  LYMPHSABS 0.7 - 4.0 K/uL - - -     Body mass index is 26.46 kg/m.  Orders:  No orders of the defined types were placed in this encounter.  No orders of the defined types were placed in this encounter.    Procedures: No procedures performed  Clinical Data: No additional findings.  ROS:  All other systems negative, except as noted in the HPI. Review of Systems  Objective: Vital Signs: Ht 5\' 8"  (1.727 m)   Wt 174 lb (78.9 kg)   BMI 26.46 kg/m   Specialty Comments:  No specialty comments available.  PMFS History: Patient Active Problem List   Diagnosis Date Noted  . Gangrene of toe of right foot ([place])   . Cellulitis of right foot 01/05/2020  . History of CVA (cerebrovascular accident) 01/05/2020  . Thrombocytopenia ([place]) 01/05/2020  . Diabetic ulcer  of right foot associated with diabetes mellitus due to underlying condition ([place]) 01/05/2020  . History of colonic polyps 04/01/2018  . Acute bronchitis 10/15/2017  . DM type 2, goal A1c below 7   . HCAP (healthcare-associated pneumonia) 05/29/2015  . Hyperkalemia 05/29/2015  . Diabetes mellitus with complication ([place])   . ESRD (end stage renal disease) on dialysis ([place])   . Encounter for adequacy testing for hemodialysis ([place]) 01/17/2014  . Mechanical complication of other vascular device, implant, and graft 01/17/2014  . Disturbances of vision, late effect of stroke 11/03/2013  . Right hemiparesis ([place]) 08/19/2013  . CVA (cerebral infarction) 07/20/2013  . Gastroparesis due to DM ([place]) 07/01/2013  . End stage renal disease ([place]) 06/25/2013  . Anemia 01/26/2013  . DM W/NEURO MNFST, TYPE II, UNCONTROLLED  11/19/2006  . Leukocytosis 11/19/2006  . DEPENDENT EDEMA, LEGS, BILATERAL 11/19/2006  . SINUS TACHYCARDIA 10/16/2006  . Diabetes mellitus, type 2 ([place]) 05/20/2006  . HLD (hyperlipidemia) 05/20/2006  . SYNDROME, RESTLESS LEGS 05/20/2006  . Hereditary and idiopathic peripheral neuropathy 05/20/2006  . Hypertension associated with diabetes ([place]) 05/20/2006   Past Medical History:  Diagnosis Date  . Anemia   . Cataract   . Chronic kidney disease   . CVA (cerebral infarction)   . Diabetes mellitus    Type 2  . Diabetes mellitus without complication ([place])   . Dialysis patient ([place])   . Dialysis patient ([place])    M, W, F  . Fistula    R arm  . GERD (gastroesophageal reflux disease)   . Hypertension   . Renal disorder   . Shortness of breath   . Stroke [facility])    right side weakness    Family History  Problem Relation Age of Onset  . Cancer Sister   . Cancer Brother   . Anesthesia problems Neg Hx   . Hypotension Neg Hx   . Malignant hyperthermia Neg Hx   . Pseudochol deficiency Neg Hx     Past Surgical History:  Procedure Laterality Date  . AMPUTATION Right 01/13/2020   Procedure: AMPUTATION RIGHT FOOT 1ST RAY;  Surgeon: Newt Minion, MD;  Location: [facility];  Service: Orthopedics;  Laterality: Right;  . AV FISTULA PLACEMENT Right 09/08/2013   Procedure: CREATION OF RIGHT BRACHIAL CEPHALIC ARTERIOVENOUS FISTULA ;  Surgeon: Mal Misty, MD;  Location: [place];  Service: Vascular;  Laterality: Right;  . BASCILIC VEIN TRANSPOSITION Right 01/26/2014   Procedure: Right Arm BASILIC VEIN TRANSPOSITION;  Surgeon: Mal Misty, MD;  Location: [facility];  Service: Vascular;  Laterality: Right;  . CATARACT EXTRACTION W/PHACO  11/20/2011   Procedure: CATARACT EXTRACTION PHACO AND INTRAOCULAR LENS PLACEMENT (IOC);  Surgeon: Tonny Branch, MD;  [facility];  Service: Ophthalmology;  Laterality: Right;  CDE 18.82  . CATARACT EXTRACTION W/PHACO Left 11/18/2012   Procedure: CATARACT EXTRACTION PHACO  AND INTRAOCULAR LENS PLACEMENT (IOC);  Surgeon: Tonny Branch, MD;  [facility];  Service: Ophthalmology;  Laterality: Left;  CDE: 18.97  . COLONOSCOPY N/A 02/09/2013   Procedure: COLONOSCOPY;  Surgeon: Rogene Houston, MD;  [facility];  Service: Endoscopy;  Laterality: N/A;  305-moved to 220 Ann to notify pt  . COLONOSCOPY N/A 07/08/2018   Procedure: COLONOSCOPY;  Surgeon: Rogene Houston, MD;  [facility];  Service: Endoscopy;  Laterality: N/A;  930  . INSERTION OF DIALYSIS CATHETER Right 06/24/2013   Procedure: INSERTION OF DIALYSIS CATHETER: Ultrasound guided;  Surgeon: Serafina Mitchell,  MD;  Location: [facility];  Service: Vascular;  Laterality: Right;  . LIGATION OF ARTERIOVENOUS  FISTULA Right 01/26/2014   Procedure: LIGATION OF ARTERIOVENOUS  FISTULA;  Surgeon: Mal Misty, MD;  Location: [place] [facility];  Service: Vascular;  Laterality: Right;  . 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 LOOP RECORDER IMPLANT N/A 07/21/2013   Procedure: LOOP RECORDER IMPLANT;  Surgeon: Evans Lance, MD;  Location: [facility] CATH LAB;  Service: Cardiovascular;  Laterality: N/A;  . POLYPECTOMY  07/08/2018   Procedure: POLYPECTOMY;  Surgeon: Rogene Houston, MD;  [facility];  Service: Endoscopy;;  Descending colon polyps x 2   . TEE WITHOUT CARDIOVERSION N/A 07/21/2013   Procedure: TRANSESOPHAGEAL ECHOCARDIOGRAM (TEE);  Surgeon: Dorothy Spark, MD;  Location: [facility] ENDOSCOPY;  Service: Cardiovascular;  Laterality: N/A;   Social History   Occupational History  . Not on file  Tobacco Use  . Smoking status: Never Smoker  . Smokeless tobacco: Never Used  Vaping Use  . Vaping Use: Never used  Substance and Sexual Activity  . Alcohol use: No  . Drug use: No  . Sexual activity: Not on file

## 2020-05-09 DIAGNOSIS — Z992 Dependence on renal dialysis: Secondary | ICD-10-CM | POA: Diagnosis not present

## 2020-05-09 DIAGNOSIS — N186 End stage renal disease: Secondary | ICD-10-CM | POA: Diagnosis not present

## 2020-05-11 DIAGNOSIS — Z992 Dependence on renal dialysis: Secondary | ICD-10-CM | POA: Diagnosis not present

## 2020-05-11 DIAGNOSIS — N186 End stage renal disease: Secondary | ICD-10-CM | POA: Diagnosis not present

## 2020-05-14 DIAGNOSIS — N186 End stage renal disease: Secondary | ICD-10-CM | POA: Diagnosis not present

## 2020-05-14 DIAGNOSIS — Z992 Dependence on renal dialysis: Secondary | ICD-10-CM | POA: Diagnosis not present

## 2020-05-15 DIAGNOSIS — Z89411 Acquired absence of right great toe: Secondary | ICD-10-CM | POA: Diagnosis not present

## 2020-05-15 DIAGNOSIS — E1122 Type 2 diabetes mellitus with diabetic chronic kidney disease: Secondary | ICD-10-CM | POA: Diagnosis not present

## 2020-05-15 DIAGNOSIS — E519 Thiamine deficiency, unspecified: Secondary | ICD-10-CM | POA: Diagnosis not present

## 2020-05-15 DIAGNOSIS — D510 Vitamin B12 deficiency anemia due to intrinsic factor deficiency: Secondary | ICD-10-CM | POA: Diagnosis not present

## 2020-05-15 DIAGNOSIS — Z0000 Encounter for general adult medical examination without abnormal findings: Secondary | ICD-10-CM | POA: Diagnosis not present

## 2020-05-15 DIAGNOSIS — I120 Hypertensive chronic kidney disease with stage 5 chronic kidney disease or end stage renal disease: Secondary | ICD-10-CM | POA: Diagnosis not present

## 2020-05-15 DIAGNOSIS — N186 End stage renal disease: Secondary | ICD-10-CM | POA: Diagnosis not present

## 2020-05-15 DIAGNOSIS — Z992 Dependence on renal dialysis: Secondary | ICD-10-CM | POA: Diagnosis not present

## 2020-05-16 DIAGNOSIS — Z992 Dependence on renal dialysis: Secondary | ICD-10-CM | POA: Diagnosis not present

## 2020-05-16 DIAGNOSIS — N186 End stage renal disease: Secondary | ICD-10-CM | POA: Diagnosis not present

## 2020-05-17 DIAGNOSIS — Z89431 Acquired absence of right foot: Secondary | ICD-10-CM | POA: Diagnosis not present

## 2020-05-17 DIAGNOSIS — T8789 Other complications of amputation stump: Secondary | ICD-10-CM | POA: Diagnosis not present

## 2020-05-17 DIAGNOSIS — I70203 Unspecified atherosclerosis of native arteries of extremities, bilateral legs: Secondary | ICD-10-CM | POA: Diagnosis not present

## 2020-05-17 DIAGNOSIS — Z794 Long term (current) use of insulin: Secondary | ICD-10-CM | POA: Diagnosis not present

## 2020-05-17 DIAGNOSIS — N186 End stage renal disease: Secondary | ICD-10-CM | POA: Diagnosis not present

## 2020-05-17 DIAGNOSIS — E1122 Type 2 diabetes mellitus with diabetic chronic kidney disease: Secondary | ICD-10-CM | POA: Diagnosis not present

## 2020-05-17 DIAGNOSIS — I120 Hypertensive chronic kidney disease with stage 5 chronic kidney disease or end stage renal disease: Secondary | ICD-10-CM | POA: Diagnosis not present

## 2020-05-17 DIAGNOSIS — R262 Difficulty in walking, not elsewhere classified: Secondary | ICD-10-CM | POA: Diagnosis not present

## 2020-05-17 DIAGNOSIS — Z992 Dependence on renal dialysis: Secondary | ICD-10-CM | POA: Diagnosis not present

## 2020-05-18 DIAGNOSIS — Z992 Dependence on renal dialysis: Secondary | ICD-10-CM | POA: Diagnosis not present

## 2020-05-18 DIAGNOSIS — N186 End stage renal disease: Secondary | ICD-10-CM | POA: Diagnosis not present

## 2020-05-19 DIAGNOSIS — T8189XA Other complications of procedures, not elsewhere classified, initial encounter: Secondary | ICD-10-CM | POA: Diagnosis not present

## 2020-05-21 DIAGNOSIS — Z992 Dependence on renal dialysis: Secondary | ICD-10-CM | POA: Diagnosis not present

## 2020-05-21 DIAGNOSIS — Z9181 History of falling: Secondary | ICD-10-CM | POA: Diagnosis not present

## 2020-05-21 DIAGNOSIS — N186 End stage renal disease: Secondary | ICD-10-CM | POA: Diagnosis not present

## 2020-05-23 DIAGNOSIS — N186 End stage renal disease: Secondary | ICD-10-CM | POA: Diagnosis not present

## 2020-05-23 DIAGNOSIS — Z992 Dependence on renal dialysis: Secondary | ICD-10-CM | POA: Diagnosis not present

## 2020-05-24 DIAGNOSIS — Z794 Long term (current) use of insulin: Secondary | ICD-10-CM | POA: Diagnosis not present

## 2020-05-24 DIAGNOSIS — I70203 Unspecified atherosclerosis of native arteries of extremities, bilateral legs: Secondary | ICD-10-CM | POA: Diagnosis not present

## 2020-05-24 DIAGNOSIS — I120 Hypertensive chronic kidney disease with stage 5 chronic kidney disease or end stage renal disease: Secondary | ICD-10-CM | POA: Diagnosis not present

## 2020-05-24 DIAGNOSIS — E1122 Type 2 diabetes mellitus with diabetic chronic kidney disease: Secondary | ICD-10-CM | POA: Diagnosis not present

## 2020-05-24 DIAGNOSIS — R262 Difficulty in walking, not elsewhere classified: Secondary | ICD-10-CM | POA: Diagnosis not present

## 2020-05-24 DIAGNOSIS — N186 End stage renal disease: Secondary | ICD-10-CM | POA: Diagnosis not present

## 2020-05-24 DIAGNOSIS — T8789 Other complications of amputation stump: Secondary | ICD-10-CM | POA: Diagnosis not present

## 2020-05-24 DIAGNOSIS — Z89431 Acquired absence of right foot: Secondary | ICD-10-CM | POA: Diagnosis not present

## 2020-05-24 DIAGNOSIS — Z992 Dependence on renal dialysis: Secondary | ICD-10-CM | POA: Diagnosis not present

## 2020-05-25 DIAGNOSIS — Z992 Dependence on renal dialysis: Secondary | ICD-10-CM | POA: Diagnosis not present

## 2020-05-25 DIAGNOSIS — N186 End stage renal disease: Secondary | ICD-10-CM | POA: Diagnosis not present

## 2020-05-28 DIAGNOSIS — D509 Iron deficiency anemia, unspecified: Secondary | ICD-10-CM | POA: Diagnosis not present

## 2020-05-28 DIAGNOSIS — N2581 Secondary hyperparathyroidism of renal origin: Secondary | ICD-10-CM | POA: Diagnosis not present

## 2020-05-28 DIAGNOSIS — N186 End stage renal disease: Secondary | ICD-10-CM | POA: Diagnosis not present

## 2020-05-28 DIAGNOSIS — Z992 Dependence on renal dialysis: Secondary | ICD-10-CM | POA: Diagnosis not present

## 2020-05-29 ENCOUNTER — Encounter: Payer: Self-pay | Admitting: Physician Assistant

## 2020-05-29 ENCOUNTER — Ambulatory Visit: Payer: Medicare Other | Admitting: Physician Assistant

## 2020-05-29 VITALS — Ht 68.0 in | Wt 174.0 lb

## 2020-05-29 DIAGNOSIS — E08621 Diabetes mellitus due to underlying condition with foot ulcer: Secondary | ICD-10-CM

## 2020-05-29 DIAGNOSIS — L97411 Non-pressure chronic ulcer of right heel and midfoot limited to breakdown of skin: Secondary | ICD-10-CM

## 2020-05-29 NOTE — Progress Notes (Signed)
Office Visit Note   Patient: [name]           Date of Birth: 08/18/1940           MRN: [id number] Visit Date: 05/29/2020              Requested by: [name], [address] [place],  [place] [address] PCP: [name], MD  Chief Complaint  Patient presents with  . 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  She has been doing Iodosorb dressing changes she feels she is improving  Assessment & Plan: Visit Diagnoses: No diagnosis found.  Plan: Continue dressing changes follow-up in 4 weeks prescription for shoe and filler was provided today Follow-Up Instructions: No follow-ups on file.   Ortho Exam  Patient is alert, oriented, no adenopathy, well-dressed, normal affect, normal respiratory effort. Examination demonstrates decreased swelling.  Just 1 small area of superficial dehiscence.  No surrounding cellulitis.  No foul odor.  Imaging: No results found. No images are attached to the encounter.  Labs: Lab Results  Component Value Date   HGBA1C 7.7 (H) 01/05/2020   HGBA1C 7.5 (H) 10/14/2017   HGBA1C 9.8 (H) 06/01/2015   ESRSEDRATE 42 (H) 01/06/2020   CRP 20.3 (H) 01/06/2020   REPTSTATUS 01/10/2020 FINAL 01/05/2020   CULT  01/05/2020    NO GROWTH 5 DAYS Performed at [facility], [place] [address], [place], [place] [address]      Lab Results  Component Value Date   ALBUMIN 3.5 03/23/2020   ALBUMIN 2.5 (L) 01/16/2020   ALBUMIN 2.9 (L) 01/06/2020    Lab Results  Component Value Date   MG 1.9 06/26/2013   MG 1.7 01/25/2009   No results found for: VD25OH  No results found for: PREALBUMIN CBC EXTENDED Latest Ref Rng & Units 03/23/2020 01/16/2020 01/13/2020  WBC 4.0 - 10.5 K/uL 8.7 17.4(H) -  RBC 3.87 - 5.11 MIL/uL 3.66(L) 2.99(L) -  HGB 12.0 - 15.0 g/dL 10.9(L) 9.2(L) 13.9  HCT 36.0 - 46.0 % 34.7(L) 27.1(L) 41.0  PLT  150 - 400 K/uL 161 161 -  NEUTROABS 1.7 - 7.7 K/uL - - -  LYMPHSABS 0.7 - 4.0 K/uL - - -     Body mass index is 26.46 kg/m.  Orders:  No orders of the defined types were placed in this encounter.  No orders of the defined types were placed in this encounter.    Procedures: No procedures performed  Clinical Data: No additional findings.  ROS:  All other systems negative, except as noted in the HPI. Review of Systems  Objective: Vital Signs: Ht 5\' 8"  (1.727 m)   Wt 174 lb (78.9 kg)   BMI 26.46 kg/m   Specialty Comments:  No specialty comments available.  PMFS History: Patient Active Problem List   Diagnosis Date Noted  . Gangrene of toe of right foot ([place])   . Cellulitis of right foot 01/05/2020  . History of CVA (cerebrovascular accident) 01/05/2020  . Thrombocytopenia ([place]) 01/05/2020  . Diabetic ulcer of right foot associated with diabetes mellitus due to underlying condition ([place]) 01/05/2020  . History of colonic polyps 04/01/2018  . Acute bronchitis 10/15/2017  . DM type 2, goal A1c below 7   . HCAP (healthcare-associated pneumonia) 05/29/2015  . Hyperkalemia 05/29/2015  . Diabetes mellitus with  complication ([place])   . ESRD (end stage renal disease) on dialysis ([place])   . Encounter for adequacy testing for hemodialysis ([place]) 01/17/2014  . Mechanical complication of other vascular device, implant, and graft 01/17/2014  . Disturbances of vision, late effect of stroke 11/03/2013  . Right hemiparesis ([place]) 08/19/2013  . CVA (cerebral infarction) 07/20/2013  . Gastroparesis due to DM ([place]) 07/01/2013  . End stage renal disease ([place]) 06/25/2013  . Anemia 01/26/2013  . DM W/NEURO MNFST, TYPE II, UNCONTROLLED 11/19/2006  . Leukocytosis 11/19/2006  . DEPENDENT EDEMA, LEGS, BILATERAL 11/19/2006  . SINUS TACHYCARDIA 10/16/2006  . Diabetes mellitus, type 2 ([place]) 05/20/2006  . HLD (hyperlipidemia) 05/20/2006  . SYNDROME, RESTLESS LEGS 05/20/2006  . Hereditary and  idiopathic peripheral neuropathy 05/20/2006  . Hypertension associated with diabetes ([place]) 05/20/2006   Past Medical History:  Diagnosis Date  . Anemia   . Cataract   . Chronic kidney disease   . CVA (cerebral infarction)   . Diabetes mellitus    Type 2  . Diabetes mellitus without complication ([place])   . Dialysis patient ([place])   . Dialysis patient ([place])    M, W, F  . Fistula    R arm  . GERD (gastroesophageal reflux disease)   . Hypertension   . Renal disorder   . Shortness of breath   . Stroke [facility])    right side weakness    Family History  Problem Relation Age of Onset  . Cancer Sister   . Cancer Brother   . Anesthesia problems Neg Hx   . Hypotension Neg Hx   . Malignant hyperthermia Neg Hx   . Pseudochol deficiency Neg Hx     Past Surgical History:  Procedure Laterality Date  . AMPUTATION Right 01/13/2020   Procedure: AMPUTATION RIGHT FOOT 1ST RAY;  Surgeon: Newt Minion, MD;  Location: [place];  Service: Orthopedics;  Laterality: Right;  . AV FISTULA PLACEMENT Right 09/08/2013   Procedure: CREATION OF RIGHT BRACHIAL CEPHALIC ARTERIOVENOUS FISTULA ;  Surgeon: Mal Misty, MD;  Location: [place];  Service: Vascular;  Laterality: Right;  . BASCILIC VEIN TRANSPOSITION Right 01/26/2014   Procedure: Right Arm BASILIC VEIN TRANSPOSITION;  Surgeon: Mal Misty, MD;  Location: [place];  Service: Vascular;  Laterality: Right;  . CATARACT EXTRACTION W/PHACO  11/20/2011   Procedure: CATARACT EXTRACTION PHACO AND INTRAOCULAR LENS PLACEMENT (IOC);  Surgeon: Tonny Branch, MD;  [facility];  Service: Ophthalmology;  Laterality: Right;  CDE 18.82  . CATARACT EXTRACTION W/PHACO Left 11/18/2012   Procedure: CATARACT EXTRACTION PHACO AND INTRAOCULAR LENS PLACEMENT (IOC);  Surgeon: Tonny Branch, MD;  Location[facility];  Service: Ophthalmology;  Laterality: Left;  CDE: 18.97  . COLONOSCOPY N/A 02/09/2013   Procedure: COLONOSCOPY;  Surgeon: Rogene Houston, MD;  [facility];   Service: Endoscopy;  Laterality: N/A;  305-moved to 220 Ann to notify pt  . COLONOSCOPY N/A 07/08/2018   Procedure: COLONOSCOPY;  Surgeon: Rogene Houston, MD;  [facility];  Service: Endoscopy;  Laterality: N/A;  930  . INSERTION OF DIALYSIS CATHETER Right 06/24/2013   Procedure: INSERTION OF DIALYSIS CATHETER: Ultrasound guided;  Surgeon: Serafina Mitchell, MD;  Location: [facility];  Service: Vascular;  Laterality: Right;  . LIGATION OF ARTERIOVENOUS  FISTULA Right 01/26/2014   Procedure: LIGATION OF ARTERIOVENOUS  FISTULA;  Surgeon: Mal Misty, MD;  Location: [place];  Service: Vascular;  Laterality: Right;  . 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 LOOP RECORDER IMPLANT N/A 07/21/2013   Procedure: LOOP RECORDER IMPLANT;  Surgeon: Evans Lance, MD;  Location: [facility] CATH LAB;  Service: Cardiovascular;  Laterality: N/A;  . POLYPECTOMY  07/08/2018   Procedure: POLYPECTOMY;  Surgeon: Rogene Houston, MD;  [facility];  Service: Endoscopy;;  Descending colon polyps x 2   . TEE WITHOUT CARDIOVERSION N/A 07/21/2013   Procedure: TRANSESOPHAGEAL ECHOCARDIOGRAM (TEE);  Surgeon: Dorothy Spark, MD;  Location: [facility] ENDOSCOPY;  Service: Cardiovascular;  Laterality: N/A;   Social History   Occupational History  . Not on file  Tobacco Use  . Smoking status: Never Smoker  . Smokeless tobacco: Never Used  Vaping Use  . Vaping Use: Never used  Substance and Sexual Activity  . Alcohol use: No  . Drug use: No  . Sexual activity: Not on file

## 2020-05-30 DIAGNOSIS — Z992 Dependence on renal dialysis: Secondary | ICD-10-CM | POA: Diagnosis not present

## 2020-05-30 DIAGNOSIS — N186 End stage renal disease: Secondary | ICD-10-CM | POA: Diagnosis not present

## 2020-05-31 DIAGNOSIS — N186 End stage renal disease: Secondary | ICD-10-CM | POA: Diagnosis not present

## 2020-05-31 DIAGNOSIS — Z794 Long term (current) use of insulin: Secondary | ICD-10-CM | POA: Diagnosis not present

## 2020-05-31 DIAGNOSIS — Z89431 Acquired absence of right foot: Secondary | ICD-10-CM | POA: Diagnosis not present

## 2020-05-31 DIAGNOSIS — E1122 Type 2 diabetes mellitus with diabetic chronic kidney disease: Secondary | ICD-10-CM | POA: Diagnosis not present

## 2020-05-31 DIAGNOSIS — I70203 Unspecified atherosclerosis of native arteries of extremities, bilateral legs: Secondary | ICD-10-CM | POA: Diagnosis not present

## 2020-05-31 DIAGNOSIS — T8789 Other complications of amputation stump: Secondary | ICD-10-CM | POA: Diagnosis not present

## 2020-05-31 DIAGNOSIS — I120 Hypertensive chronic kidney disease with stage 5 chronic kidney disease or end stage renal disease: Secondary | ICD-10-CM | POA: Diagnosis not present

## 2020-05-31 DIAGNOSIS — Z992 Dependence on renal dialysis: Secondary | ICD-10-CM | POA: Diagnosis not present

## 2020-05-31 DIAGNOSIS — R262 Difficulty in walking, not elsewhere classified: Secondary | ICD-10-CM | POA: Diagnosis not present

## 2020-06-01 DIAGNOSIS — N186 End stage renal disease: Secondary | ICD-10-CM | POA: Diagnosis not present

## 2020-06-01 DIAGNOSIS — Z992 Dependence on renal dialysis: Secondary | ICD-10-CM | POA: Diagnosis not present

## 2020-06-04 DIAGNOSIS — Z992 Dependence on renal dialysis: Secondary | ICD-10-CM | POA: Diagnosis not present

## 2020-06-04 DIAGNOSIS — N186 End stage renal disease: Secondary | ICD-10-CM | POA: Diagnosis not present

## 2020-06-06 DIAGNOSIS — Z992 Dependence on renal dialysis: Secondary | ICD-10-CM | POA: Diagnosis not present

## 2020-06-06 DIAGNOSIS — N186 End stage renal disease: Secondary | ICD-10-CM | POA: Diagnosis not present

## 2020-06-08 DIAGNOSIS — Z992 Dependence on renal dialysis: Secondary | ICD-10-CM | POA: Diagnosis not present

## 2020-06-08 DIAGNOSIS — N186 End stage renal disease: Secondary | ICD-10-CM | POA: Diagnosis not present

## 2020-06-11 DIAGNOSIS — N186 End stage renal disease: Secondary | ICD-10-CM | POA: Diagnosis not present

## 2020-06-11 DIAGNOSIS — Z992 Dependence on renal dialysis: Secondary | ICD-10-CM | POA: Diagnosis not present

## 2020-06-12 DIAGNOSIS — E1122 Type 2 diabetes mellitus with diabetic chronic kidney disease: Secondary | ICD-10-CM | POA: Diagnosis not present

## 2020-06-12 DIAGNOSIS — Z992 Dependence on renal dialysis: Secondary | ICD-10-CM | POA: Diagnosis not present

## 2020-06-12 DIAGNOSIS — Z794 Long term (current) use of insulin: Secondary | ICD-10-CM | POA: Diagnosis not present

## 2020-06-12 DIAGNOSIS — R262 Difficulty in walking, not elsewhere classified: Secondary | ICD-10-CM | POA: Diagnosis not present

## 2020-06-12 DIAGNOSIS — Z89431 Acquired absence of right foot: Secondary | ICD-10-CM | POA: Diagnosis not present

## 2020-06-12 DIAGNOSIS — N186 End stage renal disease: Secondary | ICD-10-CM | POA: Diagnosis not present

## 2020-06-12 DIAGNOSIS — T8789 Other complications of amputation stump: Secondary | ICD-10-CM | POA: Diagnosis not present

## 2020-06-12 DIAGNOSIS — I120 Hypertensive chronic kidney disease with stage 5 chronic kidney disease or end stage renal disease: Secondary | ICD-10-CM | POA: Diagnosis not present

## 2020-06-12 DIAGNOSIS — I70203 Unspecified atherosclerosis of native arteries of extremities, bilateral legs: Secondary | ICD-10-CM | POA: Diagnosis not present

## 2020-06-13 DIAGNOSIS — N186 End stage renal disease: Secondary | ICD-10-CM | POA: Diagnosis not present

## 2020-06-13 DIAGNOSIS — Z992 Dependence on renal dialysis: Secondary | ICD-10-CM | POA: Diagnosis not present

## 2020-06-15 DIAGNOSIS — N186 End stage renal disease: Secondary | ICD-10-CM | POA: Diagnosis not present

## 2020-06-15 DIAGNOSIS — E519 Thiamine deficiency, unspecified: Secondary | ICD-10-CM | POA: Diagnosis not present

## 2020-06-15 DIAGNOSIS — Z992 Dependence on renal dialysis: Secondary | ICD-10-CM | POA: Diagnosis not present

## 2020-06-15 DIAGNOSIS — E1122 Type 2 diabetes mellitus with diabetic chronic kidney disease: Secondary | ICD-10-CM | POA: Diagnosis not present

## 2020-06-15 DIAGNOSIS — I120 Hypertensive chronic kidney disease with stage 5 chronic kidney disease or end stage renal disease: Secondary | ICD-10-CM | POA: Diagnosis not present

## 2020-06-19 DIAGNOSIS — N186 End stage renal disease: Secondary | ICD-10-CM | POA: Diagnosis not present

## 2020-06-19 DIAGNOSIS — Z992 Dependence on renal dialysis: Secondary | ICD-10-CM | POA: Diagnosis not present

## 2020-06-20 DIAGNOSIS — I70203 Unspecified atherosclerosis of native arteries of extremities, bilateral legs: Secondary | ICD-10-CM | POA: Diagnosis not present

## 2020-06-20 DIAGNOSIS — E1122 Type 2 diabetes mellitus with diabetic chronic kidney disease: Secondary | ICD-10-CM | POA: Diagnosis not present

## 2020-06-20 DIAGNOSIS — Z992 Dependence on renal dialysis: Secondary | ICD-10-CM | POA: Diagnosis not present

## 2020-06-20 DIAGNOSIS — T8789 Other complications of amputation stump: Secondary | ICD-10-CM | POA: Diagnosis not present

## 2020-06-20 DIAGNOSIS — Z794 Long term (current) use of insulin: Secondary | ICD-10-CM | POA: Diagnosis not present

## 2020-06-20 DIAGNOSIS — Z89431 Acquired absence of right foot: Secondary | ICD-10-CM | POA: Diagnosis not present

## 2020-06-20 DIAGNOSIS — I120 Hypertensive chronic kidney disease with stage 5 chronic kidney disease or end stage renal disease: Secondary | ICD-10-CM | POA: Diagnosis not present

## 2020-06-20 DIAGNOSIS — R262 Difficulty in walking, not elsewhere classified: Secondary | ICD-10-CM | POA: Diagnosis not present

## 2020-06-20 DIAGNOSIS — N186 End stage renal disease: Secondary | ICD-10-CM | POA: Diagnosis not present

## 2020-06-22 DIAGNOSIS — N186 End stage renal disease: Secondary | ICD-10-CM | POA: Diagnosis not present

## 2020-06-22 DIAGNOSIS — Z992 Dependence on renal dialysis: Secondary | ICD-10-CM | POA: Diagnosis not present

## 2020-06-25 DIAGNOSIS — Z992 Dependence on renal dialysis: Secondary | ICD-10-CM | POA: Diagnosis not present

## 2020-06-25 DIAGNOSIS — N2581 Secondary hyperparathyroidism of renal origin: Secondary | ICD-10-CM | POA: Diagnosis not present

## 2020-06-25 DIAGNOSIS — E119 Type 2 diabetes mellitus without complications: Secondary | ICD-10-CM | POA: Diagnosis not present

## 2020-06-25 DIAGNOSIS — D509 Iron deficiency anemia, unspecified: Secondary | ICD-10-CM | POA: Diagnosis not present

## 2020-06-25 DIAGNOSIS — N186 End stage renal disease: Secondary | ICD-10-CM | POA: Diagnosis not present

## 2020-06-26 ENCOUNTER — Ambulatory Visit: Payer: Medicare Other | Admitting: Physician Assistant

## 2020-06-27 DIAGNOSIS — N186 End stage renal disease: Secondary | ICD-10-CM | POA: Diagnosis not present

## 2020-06-27 DIAGNOSIS — Z992 Dependence on renal dialysis: Secondary | ICD-10-CM | POA: Diagnosis not present

## 2020-06-28 ENCOUNTER — Ambulatory Visit
Admission: EM | Admit: 2020-06-28 | Discharge: 2020-06-28 | Disposition: A | Discharge status: Home / Self Care | Payer: Medicare Other | Attending: Family Medicine | Admitting: Family Medicine

## 2020-06-28 ENCOUNTER — Encounter: Payer: Self-pay | Admitting: Emergency Medicine

## 2020-06-28 ENCOUNTER — Other Ambulatory Visit: Payer: Self-pay

## 2020-06-28 DIAGNOSIS — R062 Wheezing: Secondary | ICD-10-CM

## 2020-06-28 DIAGNOSIS — B349 Viral infection, unspecified: Secondary | ICD-10-CM

## 2020-06-28 DIAGNOSIS — T8789 Other complications of amputation stump: Secondary | ICD-10-CM | POA: Diagnosis not present

## 2020-06-28 DIAGNOSIS — R059 Cough, unspecified: Secondary | ICD-10-CM | POA: Diagnosis not present

## 2020-06-28 DIAGNOSIS — Z20822 Contact with and (suspected) exposure to covid-19: Secondary | ICD-10-CM

## 2020-06-28 DIAGNOSIS — R0789 Other chest pain: Secondary | ICD-10-CM | POA: Diagnosis not present

## 2020-06-28 DIAGNOSIS — I120 Hypertensive chronic kidney disease with stage 5 chronic kidney disease or end stage renal disease: Secondary | ICD-10-CM | POA: Diagnosis not present

## 2020-06-28 DIAGNOSIS — Z794 Long term (current) use of insulin: Secondary | ICD-10-CM | POA: Diagnosis not present

## 2020-06-28 DIAGNOSIS — Z89431 Acquired absence of right foot: Secondary | ICD-10-CM | POA: Diagnosis not present

## 2020-06-28 DIAGNOSIS — Z992 Dependence on renal dialysis: Secondary | ICD-10-CM | POA: Diagnosis not present

## 2020-06-28 DIAGNOSIS — N186 End stage renal disease: Secondary | ICD-10-CM | POA: Diagnosis not present

## 2020-06-28 DIAGNOSIS — I70203 Unspecified atherosclerosis of native arteries of extremities, bilateral legs: Secondary | ICD-10-CM | POA: Diagnosis not present

## 2020-06-28 DIAGNOSIS — R262 Difficulty in walking, not elsewhere classified: Secondary | ICD-10-CM | POA: Diagnosis not present

## 2020-06-28 DIAGNOSIS — E1122 Type 2 diabetes mellitus with diabetic chronic kidney disease: Secondary | ICD-10-CM | POA: Diagnosis not present

## 2020-06-28 MED ORDER — DEXAMETHASONE SODIUM PHOSPHATE 10 MG/ML IJ SOLN
10.0000 mg | Freq: Once | INTRAMUSCULAR | Status: AC
  Administered 2020-06-28: 10 mg via INTRAMUSCULAR

## 2020-06-28 MED ORDER — BENZONATATE 100 MG PO CAPS: 100.0000 mg | ORAL_CAPSULE | Freq: Three times a day (TID) | ORAL | 0 refills | Status: DC

## 2020-06-28 NOTE — ED Triage Notes (Signed)
Cough that started 2 days ago.  States her lower chest is sore from coughing

## 2020-06-28 NOTE — Discharge Instructions (Signed)
I have sent in [place] for you to use one capsule every 8 hours as needed for cough.  You have received a steroid injection in the office as well  Your COVID test is pending.  You should self quarantine until the test result is back.    Take Tylenol or ibuprofen as needed for fever or discomfort.  Rest and keep yourself hydrated.    Follow-up with your primary care provider if your symptoms are not improving.

## 2020-06-28 NOTE — ED Provider Notes (Signed)
[facility]   [id number] 06/28/20 Arrival Time: 0909   CC: COVID symptoms  SUBJECTIVE: History from: patient.  [name] is a 80 y.o. female who presents with SOB and chest discomfort with coughing x 2 days. Denies sick exposure to COVID, flu or strep. Denies recent travel. Has negative history of Covid. Has not completed Covid vaccines. Has not taken OTC medications for this. Cough is worse with activity. Denies previous symptoms in the past. Denies fever, chills, fatigue, sinus pain, rhinorrhea, sore throat, nausea, changes in bowel or bladder habits.    ROS: As per HPI.  All other pertinent ROS negative.     Past Medical History:  Diagnosis Date  . Anemia   . Cataract   . Chronic kidney disease   . CVA (cerebral infarction)   . Diabetes mellitus    Type 2  . Diabetes mellitus without complication ([place])   . Dialysis patient ([place])   . Dialysis patient ([place])    M, W, F  . Fistula    R arm  . GERD (gastroesophageal reflux disease)   . Hypertension   . Renal disorder   . Shortness of breath   . Stroke [facility])    right side weakness   Past Surgical History:  Procedure Laterality Date  . 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 AV FISTULA PLACEMENT Right 09/08/2013   Procedure: CREATION OF RIGHT BRACHIAL CEPHALIC ARTERIOVENOUS FISTULA ;  Surgeon: Mal Misty, MD;  Location: [place];  Service: Vascular;  Laterality: Right;  . BASCILIC VEIN TRANSPOSITION Right 01/26/2014   Procedure: Right Arm BASILIC VEIN TRANSPOSITION;  Surgeon: Mal Misty, MD;  Location: [place];  Service: Vascular;  Laterality: Right;  . CATARACT EXTRACTION W/PHACO  11/20/2011   Procedure: CATARACT EXTRACTION PHACO AND INTRAOCULAR LENS PLACEMENT (IOC);  Surgeon: Tonny Branch, MD;  [facility];  Service: Ophthalmology;  Laterality: Right;  CDE 18.82  . CATARACT  EXTRACTION W/PHACO Left 11/18/2012   Procedure: CATARACT EXTRACTION PHACO AND INTRAOCULAR LENS PLACEMENT (IOC);  Surgeon: Tonny Branch, MD;  [facility];  Service: Ophthalmology;  Laterality: Left;  CDE: 18.97  . COLONOSCOPY N/A 02/09/2013   Procedure: COLONOSCOPY;  Surgeon: Rogene Houston, MD;  [facility];  Service: Endoscopy;  Laterality: N/A;  305-moved to 220 Ann to notify pt  . COLONOSCOPY N/A 07/08/2018   Procedure: COLONOSCOPY;  Surgeon: Rogene Houston, MD;  [facility];  Service: Endoscopy;  Laterality: N/A;  930  . INSERTION OF DIALYSIS CATHETER Right 06/24/2013   Procedure: INSERTION OF DIALYSIS CATHETER: Ultrasound guided;  Surgeon: Serafina Mitchell, MD;  Location: [place];  Service: Vascular;  Laterality: Right;  . LIGATION OF ARTERIOVENOUS  FISTULA Right 01/26/2014   Procedure: LIGATION OF ARTERIOVENOUS  FISTULA;  Surgeon: Mal Misty, MD;  Location: [place];  Service: Vascular;  Laterality: Right;  . 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 LOOP RECORDER IMPLANT N/A 07/21/2013   Procedure: LOOP RECORDER IMPLANT;  Surgeon: Evans Lance, MD;  Location: [facility] CATH LAB;  Service: Cardiovascular;  Laterality: N/A;  . POLYPECTOMY  07/08/2018   Procedure: POLYPECTOMY;  Surgeon: Rogene Houston, MD;  [facility];  Service: Endoscopy;;  Descending colon polyps x 2   . TEE WITHOUT CARDIOVERSION  N/A 07/21/2013   Procedure: TRANSESOPHAGEAL ECHOCARDIOGRAM (TEE);  Surgeon: Dorothy Spark, MD;  Location: [facility] ENDOSCOPY;  Service: Cardiovascular;  Laterality: N/A;   Allergies  Allergen Reactions  . Ambien [Zolpidem] Other (See Comments)    Hallucinations   . Reglan [Metoclopramide] Other (See Comments)    "makes me crazy"   No current facility-administered medications on file prior to encounter.   Current Outpatient Medications on File Prior to Encounter  Medication Sig Dispense Refill  . albuterol (PROVENTIL  HFA;VENTOLIN HFA) 108 (90 Base) MCG/ACT inhaler Inhale 2 puffs into the lungs every 6 (six) hours as needed for wheezing or shortness of breath. 1 Inhaler 2  . aspirin 81 MG chewable tablet Chew 2 tablets (162 mg total) by mouth daily.    . BD INSULIN SYRINGE U/F 31G X 5/16" 0.3 ML MISC 2 (two) times daily. as directed    . cyanocobalamin (,VITAMIN B-12,) 1000 MCG/ML injection Inject 1 mL into the muscle every 30 (thirty) days.  11  . gabapentin (NEURONTIN) 100 MG capsule Take 1 capsule (100 mg total) by mouth 3 (three) times daily. 90 capsule 0  . insulin NPH Human (NOVOLIN N) 100 UNIT/ML injection Inject 0.07 mLs (7 Units total) into the skin 2 (two) times daily before a meal. 10 mL 0  . labetalol (NORMODYNE) 100 MG tablet Take 1 tablet (100 mg total) by mouth 2 (two) times daily. 60 tablet 0  . labetalol (NORMODYNE) 200 MG tablet Take 200 mg by mouth 2 (two) times daily.    Marland Kitchen lidocaine-prilocaine (EMLA) cream Apply 1 application topically every Monday, Wednesday, and Friday.     . multivitamin (RENA-VIT) TABS tablet Take 1 tablet by mouth at bedtime. 30 tablet 0  . nitroGLYCERIN (NITRODUR - DOSED IN MG/24 HR) 0.2 mg/hr patch Place 1 patch (0.2 mg total) onto the skin daily. (For Foot) 30 patch 3  . omeprazole (PRILOSEC) 20 MG capsule Take 1 capsule (20 mg total) by mouth daily. 30 capsule 1  . oxyCODONE (OXY IR/ROXICODONE) 5 MG immediate release tablet Take 1 tablet (5 mg total) by mouth every 8 (eight) hours as needed for severe pain. 30 tablet 0  . pentoxifylline (TRENTAL) 400 MG CR tablet Take 1 tablet (400 mg total) by mouth daily with breakfast. 30 tablet 0  . sevelamer carbonate (RENVELA) 800 MG tablet Take 800 mg by mouth 3 (three) times daily with meals.   11  . silver sulfADIAZINE (SILVADENE) 1 % cream Apply 1 application topically daily. 50 g 0  . sucralfate (CARAFATE) 1 g tablet Take 1 tablet (1 g total) by mouth 4 (four) times daily as needed. 30 tablet 0   Social History    Socioeconomic History  . Marital status: Widowed    Spouse name: Not on file  . Number of children: Not on file  . Years of education: Not on file  . Highest education level: Not on file  Occupational History  . Not on file  Tobacco Use  . Smoking status: Never Smoker  . Smokeless tobacco: Never Used  Vaping Use  . Vaping Use: Never used  Substance and Sexual Activity  . Alcohol use: No  . Drug use: No  . Sexual activity: Not on file  Other Topics Concern  . Not on file  Social History Narrative   ** Merged History Encounter **       Social Determinants of Health   Financial Resource Strain: Not on file  Food Insecurity: Not on file  Transportation Needs: Not on file  Physical Activity: Not on file  Stress: Not on file  Social Connections: Not on file  Intimate Partner Violence: Not on file   Family History  Problem Relation Age of Onset  . Cancer Sister   . Cancer Brother   . Anesthesia problems Neg Hx   . Hypotension Neg Hx   . Malignant hyperthermia Neg Hx   . Pseudochol deficiency Neg Hx     OBJECTIVE:  Vitals:   06/28/20 1000 06/28/20 1001  BP: (!) 154/71   Pulse: 76   Resp: 20   Temp: 98.1 F (36.7 C)   TempSrc: Oral   SpO2: 92%   Weight:  158 lb (71.7 kg)  Height:  5\' 8"  (1.727 m)     General appearance: alert; appears fatigued, but nontoxic; speaking in full sentences and tolerating own secretions HEENT: NCAT; Ears: EACs clear, TMs pearly gray; Eyes: PERRL.  EOM grossly intact. Sinuses: nontender; Nose: nares patent with clear rhinorrhea, Throat: oropharynx erythematous, cobblestoning present, tonsils non erythematous or enlarged, uvula midline  Neck: supple without LAD Lungs: unlabored respirations, symmetrical air entry; cough: moderate; no respiratory distress; wheezing to bilateral lower lobes Heart: regular rate and rhythm.  Radial pulses 2+ symmetrical bilaterally Skin: warm and dry Psychological: alert and cooperative; normal mood and  affect  LABS:  No results found for this or any previous visit (from the past 24 hour(s)).   ASSESSMENT & PLAN:  1. Viral illness   2. Exposure to COVID-19 virus   3. Cough   4. Chest discomfort   5. Wheezing     Meds ordered this encounter  Medications  . benzonatate (TESSALON) 100 MG capsule    Sig: Take 1 capsule (100 mg total) by mouth every 8 (eight) hours.    Dispense:  21 capsule    Refill:  0    Order Specific Question:   Supervising Provider    Answer:   Chase Picket [id number]  . dexamethasone (DECADRON) injection 10 mg   Prescribed benzonatate Decadron 10mg  IM in office today Continue supportive care at home COVID and flu testing ordered. It will take between 2-3 days for test results. Someone will contact you regarding abnormal results.   Patient should remain in quarantine until they have received Covid results.  If negative you may resume normal activities (go back to work/school) while practicing hand hygiene, social distance, and mask wearing. If positive, patient should remain in quarantine for at least 5 days from symptom onset AND greater than 72 hours after symptoms resolution (absence of fever without the use of fever-reducing medication and improvement in respiratory symptoms), whichever is longer Get plenty of rest and push fluids Use OTC zyrtec for nasal congestion, runny nose, and/or sore throat Use OTC flonase for nasal congestion and runny nose Use medications daily for symptom relief Use OTC medications like ibuprofen or tylenol as needed fever or pain Call or go to the ED if you have any new or worsening symptoms such as fever, worsening cough, shortness of breath, chest tightness, chest pain, turning blue, changes in mental status.  Reviewed expectations re: course of current medical issues. Questions answered. Outlined signs and symptoms indicating need for more acute intervention. Patient verbalized understanding. After Visit Summary  given.         Faustino Congress, NP 06/30/20 [phone]

## 2020-06-29 DIAGNOSIS — Z992 Dependence on renal dialysis: Secondary | ICD-10-CM | POA: Diagnosis not present

## 2020-06-29 DIAGNOSIS — N186 End stage renal disease: Secondary | ICD-10-CM | POA: Diagnosis not present

## 2020-06-30 LAB — COVID-19, FLU A+B NAA
Influenza A, NAA: NOT DETECTED
Influenza B, NAA: NOT DETECTED
SARS-CoV-2, NAA: NOT DETECTED

## 2020-07-03 ENCOUNTER — Ambulatory Visit: Payer: Medicare Other | Admitting: Physician Assistant

## 2020-07-04 ENCOUNTER — Ambulatory Visit: Payer: Medicare Other | Admitting: Physician Assistant

## 2020-07-04 DIAGNOSIS — Z992 Dependence on renal dialysis: Secondary | ICD-10-CM | POA: Diagnosis not present

## 2020-07-04 DIAGNOSIS — N186 End stage renal disease: Secondary | ICD-10-CM | POA: Diagnosis not present

## 2020-07-05 DIAGNOSIS — Z89431 Acquired absence of right foot: Secondary | ICD-10-CM | POA: Diagnosis not present

## 2020-07-05 DIAGNOSIS — I70203 Unspecified atherosclerosis of native arteries of extremities, bilateral legs: Secondary | ICD-10-CM | POA: Diagnosis not present

## 2020-07-05 DIAGNOSIS — I120 Hypertensive chronic kidney disease with stage 5 chronic kidney disease or end stage renal disease: Secondary | ICD-10-CM | POA: Diagnosis not present

## 2020-07-05 DIAGNOSIS — Z794 Long term (current) use of insulin: Secondary | ICD-10-CM | POA: Diagnosis not present

## 2020-07-05 DIAGNOSIS — N186 End stage renal disease: Secondary | ICD-10-CM | POA: Diagnosis not present

## 2020-07-05 DIAGNOSIS — E1122 Type 2 diabetes mellitus with diabetic chronic kidney disease: Secondary | ICD-10-CM | POA: Diagnosis not present

## 2020-07-05 DIAGNOSIS — Z992 Dependence on renal dialysis: Secondary | ICD-10-CM | POA: Diagnosis not present

## 2020-07-05 DIAGNOSIS — T8789 Other complications of amputation stump: Secondary | ICD-10-CM | POA: Diagnosis not present

## 2020-07-05 DIAGNOSIS — R262 Difficulty in walking, not elsewhere classified: Secondary | ICD-10-CM | POA: Diagnosis not present

## 2020-07-06 DIAGNOSIS — N186 End stage renal disease: Secondary | ICD-10-CM | POA: Diagnosis not present

## 2020-07-06 DIAGNOSIS — Z992 Dependence on renal dialysis: Secondary | ICD-10-CM | POA: Diagnosis not present

## 2020-07-09 DIAGNOSIS — N2581 Secondary hyperparathyroidism of renal origin: Secondary | ICD-10-CM | POA: Diagnosis not present

## 2020-07-09 DIAGNOSIS — Z992 Dependence on renal dialysis: Secondary | ICD-10-CM | POA: Diagnosis not present

## 2020-07-09 DIAGNOSIS — N186 End stage renal disease: Secondary | ICD-10-CM | POA: Diagnosis not present

## 2020-07-10 ENCOUNTER — Ambulatory Visit: Payer: Medicare Other | Admitting: Physician Assistant

## 2020-07-10 ENCOUNTER — Encounter: Payer: Self-pay | Admitting: Orthopedic Surgery

## 2020-07-10 DIAGNOSIS — I96 Gangrene, not elsewhere classified: Secondary | ICD-10-CM

## 2020-07-10 NOTE — Progress Notes (Signed)
Office Visit Note   Patient: [name]           Date of Birth: 08/13/1940           MRN: [id number] Visit Date: 07/10/2020              Requested by: [name], [address] [place],  [place] [address] PCP: [name], MD  Chief Complaint  Patient presents with  . Right Foot - Follow-up      HPI: Patient is status post right foot wound first ray amputation.  She is doing very well.  She is accompanied by her daughter.  She has been working with Museum/gallery curator to try to get a new filler and shoe.  Apparently this prescription must come from primary care.  She is also requesting her nails to be trimmed  Assessment & Plan: Visit Diagnoses: No diagnosis found.  Plan: Follow-up in 1 month I think if she is doing well at that point she can follow-up as needed  Follow-Up Instructions: No follow-ups on file.   Ortho Exam  Patient is alert, oriented, no adenopathy, well-dressed, normal affect, normal respiratory effort. Right foot overall well-healed incision she does have onychomycotic nails which were trimmed to a stable surface.  Well-healed surgical incision pulses are palpable bilaterally in both of her feet.  No swelling no cellulitis she does still have an eschar over the wound but no drainage no necrosis  Imaging: No results found. No images are attached to the encounter.  Labs: Lab Results  Component Value Date   HGBA1C 7.7 (H) 01/05/2020   HGBA1C 7.5 (H) 10/14/2017   HGBA1C 9.8 (H) 06/01/2015   ESRSEDRATE 42 (H) 01/06/2020   CRP 20.3 (H) 01/06/2020   REPTSTATUS 01/10/2020 FINAL 01/05/2020   CULT  01/05/2020    NO GROWTH 5 DAYS Performed at [facility], [place] [address] [place], [place] [address]      Lab Results  Component Value Date   ALBUMIN 3.5 03/23/2020   ALBUMIN 2.5 (L) 01/16/2020   ALBUMIN 2.9 (L) 01/06/2020    Lab Results  Component Value Date   MG 1.9 06/26/2013   MG 1.7 01/25/2009   No results found for: VD25OH  No  results found for: PREALBUMIN CBC EXTENDED Latest Ref Rng & Units 03/23/2020 01/16/2020 01/13/2020  WBC 4.0 - 10.5 K/uL 8.7 17.4(H) -  RBC 3.87 - 5.11 MIL/uL 3.66(L) 2.99(L) -  HGB 12.0 - 15.0 g/dL 10.9(L) 9.2(L) 13.9  HCT 36.0 - 46.0 % 34.7(L) 27.1(L) 41.0  PLT 150 - 400 K/uL 161 161 -  NEUTROABS 1.7 - 7.7 K/uL - - -  LYMPHSABS 0.7 - 4.0 K/uL - - -     There is no height or weight on file to calculate BMI.  Orders:  No orders of the defined types were placed in this encounter.  No orders of the defined types were placed in this encounter.    Procedures: No procedures performed  Clinical Data: No additional findings.  ROS:  All other systems negative, except as noted in the HPI. Review of Systems  Objective: Vital Signs: There were no vitals taken for this visit.  Specialty Comments:  No specialty comments available.  PMFS History: Patient Active Problem List   Diagnosis Date Noted  . Gangrene of toe of right foot ([place])   . Cellulitis of right foot 01/05/2020  . History of CVA (cerebrovascular accident) 01/05/2020  . Thrombocytopenia ([place]) 01/05/2020  .  Diabetic ulcer of right foot associated with diabetes mellitus due to underlying condition ([place]) 01/05/2020  . History of colonic polyps 04/01/2018  . Acute bronchitis 10/15/2017  . DM type 2, goal A1c below 7   . HCAP (healthcare-associated pneumonia) 05/29/2015  . Hyperkalemia 05/29/2015  . Diabetes mellitus with complication ([place])   . ESRD (end stage renal disease) on dialysis ([place])   . Encounter for adequacy testing for hemodialysis ([place]) 01/17/2014  . Mechanical complication of other vascular device, implant, and graft 01/17/2014  . Disturbances of vision, late effect of stroke 11/03/2013  . Right hemiparesis ([place]) 08/19/2013  . CVA (cerebral infarction) 07/20/2013  . Gastroparesis due to DM ([place]) 07/01/2013  . End stage renal disease ([place]) 06/25/2013  . Anemia 01/26/2013  . DM W/NEURO MNFST, TYPE II,  UNCONTROLLED 11/19/2006  . Leukocytosis 11/19/2006  . DEPENDENT EDEMA, LEGS, BILATERAL 11/19/2006  . SINUS TACHYCARDIA 10/16/2006  . Diabetes mellitus, type 2 ([place]) 05/20/2006  . HLD (hyperlipidemia) 05/20/2006  . SYNDROME, RESTLESS LEGS 05/20/2006  . Hereditary and idiopathic peripheral neuropathy 05/20/2006  . Hypertension associated with diabetes ([place]) 05/20/2006   Past Medical History:  Diagnosis Date  . Anemia   . Cataract   . Chronic kidney disease   . CVA (cerebral infarction)   . Diabetes mellitus    Type 2  . Diabetes mellitus without complication ([place])   . Dialysis patient ([place])   . Dialysis patient ([place])    M, W, F  . Fistula    R arm  . GERD (gastroesophageal reflux disease)   . Hypertension   . Renal disorder   . Shortness of breath   . Stroke [facility])    right side weakness    Family History  Problem Relation Age of Onset  . Cancer Sister   . Cancer Brother   . Anesthesia problems Neg Hx   . Hypotension Neg Hx   . Malignant hyperthermia Neg Hx   . Pseudochol deficiency Neg Hx     Past Surgical History:  Procedure Laterality Date  . 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 AV FISTULA PLACEMENT Right 09/08/2013   Procedure: CREATION OF RIGHT BRACHIAL CEPHALIC ARTERIOVENOUS FISTULA ;  Surgeon: Mal Misty, MD;  Location: [place];  Service: Vascular;  Laterality: Right;  . BASCILIC VEIN TRANSPOSITION Right 01/26/2014   Procedure: Right Arm BASILIC VEIN TRANSPOSITION;  Surgeon: Mal Misty, MD;  Location: [place];  Service: Vascular;  Laterality: Right;  . CATARACT EXTRACTION W/PHACO  11/20/2011   Procedure: CATARACT EXTRACTION PHACO AND INTRAOCULAR LENS PLACEMENT (IOC);  Surgeon: Tonny Branch, MD;  [facility];  Service: Ophthalmology;  Laterality: Right;  CDE 18.82  . CATARACT EXTRACTION W/PHACO Left 11/18/2012   Procedure: CATARACT  EXTRACTION PHACO AND INTRAOCULAR LENS PLACEMENT (IOC);  Surgeon: Tonny Branch, MD;  [facility];  Service: Ophthalmology;  Laterality: Left;  CDE: 18.97  . COLONOSCOPY N/A 02/09/2013   Procedure: COLONOSCOPY;  Surgeon: Rogene Houston, MD;  [facility];  Service: Endoscopy;  Laterality: N/A;  305-moved to 220 Ann to notify pt  . COLONOSCOPY N/A 07/08/2018   Procedure: COLONOSCOPY;  Surgeon: Rogene Houston, MD;  [facility];  Service: Endoscopy;  Laterality: N/A;  930  . INSERTION OF DIALYSIS CATHETER Right 06/24/2013   Procedure: INSERTION OF DIALYSIS CATHETER: Ultrasound guided;  Surgeon: Durene Fruits  Pierre Bali, MD;  Location: [place];  Service: Vascular;  Laterality: Right;  . LIGATION OF ARTERIOVENOUS  FISTULA Right 01/26/2014   Procedure: LIGATION OF ARTERIOVENOUS  FISTULA;  Surgeon: Mal Misty, MD;  Location: [place];  Service: Vascular;  Laterality: Right;  . 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 LOOP RECORDER IMPLANT N/A 07/21/2013   Procedure: LOOP RECORDER IMPLANT;  Surgeon: Evans Lance, MD;  Location: [facility] CATH LAB;  Service: Cardiovascular;  Laterality: N/A;  . POLYPECTOMY  07/08/2018   Procedure: POLYPECTOMY;  Surgeon: Rogene Houston, MD;  [facility];  Service: Endoscopy;;  Descending colon polyps x 2   . TEE WITHOUT CARDIOVERSION N/A 07/21/2013   Procedure: TRANSESOPHAGEAL ECHOCARDIOGRAM (TEE);  Surgeon: Dorothy Spark, MD;  Location: [facility] ENDOSCOPY;  Service: Cardiovascular;  Laterality: N/A;   Social History   Occupational History  . Not on file  Tobacco Use  . Smoking status: Never Smoker  . Smokeless tobacco: Never Used  Vaping Use  . Vaping Use: Never used  Substance and Sexual Activity  . Alcohol use: No  . Drug use: No  . Sexual activity: Not on file

## 2020-07-11 DIAGNOSIS — N186 End stage renal disease: Secondary | ICD-10-CM | POA: Diagnosis not present

## 2020-07-11 DIAGNOSIS — Z992 Dependence on renal dialysis: Secondary | ICD-10-CM | POA: Diagnosis not present

## 2020-07-12 DIAGNOSIS — Z89431 Acquired absence of right foot: Secondary | ICD-10-CM | POA: Diagnosis not present

## 2020-07-12 DIAGNOSIS — E1122 Type 2 diabetes mellitus with diabetic chronic kidney disease: Secondary | ICD-10-CM | POA: Diagnosis not present

## 2020-07-12 DIAGNOSIS — I70203 Unspecified atherosclerosis of native arteries of extremities, bilateral legs: Secondary | ICD-10-CM | POA: Diagnosis not present

## 2020-07-12 DIAGNOSIS — Z992 Dependence on renal dialysis: Secondary | ICD-10-CM | POA: Diagnosis not present

## 2020-07-12 DIAGNOSIS — T8789 Other complications of amputation stump: Secondary | ICD-10-CM | POA: Diagnosis not present

## 2020-07-12 DIAGNOSIS — N186 End stage renal disease: Secondary | ICD-10-CM | POA: Diagnosis not present

## 2020-07-12 DIAGNOSIS — Z794 Long term (current) use of insulin: Secondary | ICD-10-CM | POA: Diagnosis not present

## 2020-07-12 DIAGNOSIS — I120 Hypertensive chronic kidney disease with stage 5 chronic kidney disease or end stage renal disease: Secondary | ICD-10-CM | POA: Diagnosis not present

## 2020-07-12 DIAGNOSIS — R262 Difficulty in walking, not elsewhere classified: Secondary | ICD-10-CM | POA: Diagnosis not present

## 2020-07-13 DIAGNOSIS — N186 End stage renal disease: Secondary | ICD-10-CM | POA: Diagnosis not present

## 2020-07-13 DIAGNOSIS — Z992 Dependence on renal dialysis: Secondary | ICD-10-CM | POA: Diagnosis not present

## 2020-07-14 DIAGNOSIS — E519 Thiamine deficiency, unspecified: Secondary | ICD-10-CM | POA: Diagnosis not present

## 2020-07-14 DIAGNOSIS — I120 Hypertensive chronic kidney disease with stage 5 chronic kidney disease or end stage renal disease: Secondary | ICD-10-CM | POA: Diagnosis not present

## 2020-07-14 DIAGNOSIS — E1122 Type 2 diabetes mellitus with diabetic chronic kidney disease: Secondary | ICD-10-CM | POA: Diagnosis not present

## 2020-07-14 DIAGNOSIS — N186 End stage renal disease: Secondary | ICD-10-CM | POA: Diagnosis not present

## 2020-07-16 DIAGNOSIS — Z992 Dependence on renal dialysis: Secondary | ICD-10-CM | POA: Diagnosis not present

## 2020-07-16 DIAGNOSIS — N186 End stage renal disease: Secondary | ICD-10-CM | POA: Diagnosis not present

## 2020-07-18 DIAGNOSIS — N186 End stage renal disease: Secondary | ICD-10-CM | POA: Diagnosis not present

## 2020-07-18 DIAGNOSIS — Z992 Dependence on renal dialysis: Secondary | ICD-10-CM | POA: Diagnosis not present

## 2020-07-20 DIAGNOSIS — Z992 Dependence on renal dialysis: Secondary | ICD-10-CM | POA: Diagnosis not present

## 2020-07-20 DIAGNOSIS — N186 End stage renal disease: Secondary | ICD-10-CM | POA: Diagnosis not present

## 2020-07-23 DIAGNOSIS — Z992 Dependence on renal dialysis: Secondary | ICD-10-CM | POA: Diagnosis not present

## 2020-07-23 DIAGNOSIS — N186 End stage renal disease: Secondary | ICD-10-CM | POA: Diagnosis not present

## 2020-07-24 DIAGNOSIS — Z992 Dependence on renal dialysis: Secondary | ICD-10-CM | POA: Diagnosis not present

## 2020-07-24 DIAGNOSIS — Z89431 Acquired absence of right foot: Secondary | ICD-10-CM | POA: Diagnosis not present

## 2020-07-24 DIAGNOSIS — R262 Difficulty in walking, not elsewhere classified: Secondary | ICD-10-CM | POA: Diagnosis not present

## 2020-07-24 DIAGNOSIS — I70203 Unspecified atherosclerosis of native arteries of extremities, bilateral legs: Secondary | ICD-10-CM | POA: Diagnosis not present

## 2020-07-24 DIAGNOSIS — I120 Hypertensive chronic kidney disease with stage 5 chronic kidney disease or end stage renal disease: Secondary | ICD-10-CM | POA: Diagnosis not present

## 2020-07-24 DIAGNOSIS — E1122 Type 2 diabetes mellitus with diabetic chronic kidney disease: Secondary | ICD-10-CM | POA: Diagnosis not present

## 2020-07-24 DIAGNOSIS — N186 End stage renal disease: Secondary | ICD-10-CM | POA: Diagnosis not present

## 2020-07-24 DIAGNOSIS — Z794 Long term (current) use of insulin: Secondary | ICD-10-CM | POA: Diagnosis not present

## 2020-07-24 DIAGNOSIS — Z4781 Encounter for orthopedic aftercare following surgical amputation: Secondary | ICD-10-CM | POA: Diagnosis not present

## 2020-07-25 DIAGNOSIS — N186 End stage renal disease: Secondary | ICD-10-CM | POA: Diagnosis not present

## 2020-07-25 DIAGNOSIS — Z992 Dependence on renal dialysis: Secondary | ICD-10-CM | POA: Diagnosis not present

## 2020-07-25 DIAGNOSIS — E119 Type 2 diabetes mellitus without complications: Secondary | ICD-10-CM | POA: Diagnosis not present

## 2020-07-26 DIAGNOSIS — I70203 Unspecified atherosclerosis of native arteries of extremities, bilateral legs: Secondary | ICD-10-CM | POA: Diagnosis not present

## 2020-07-26 DIAGNOSIS — E1122 Type 2 diabetes mellitus with diabetic chronic kidney disease: Secondary | ICD-10-CM | POA: Diagnosis not present

## 2020-07-26 DIAGNOSIS — Z992 Dependence on renal dialysis: Secondary | ICD-10-CM | POA: Diagnosis not present

## 2020-07-26 DIAGNOSIS — Z794 Long term (current) use of insulin: Secondary | ICD-10-CM | POA: Diagnosis not present

## 2020-07-26 DIAGNOSIS — Z89431 Acquired absence of right foot: Secondary | ICD-10-CM | POA: Diagnosis not present

## 2020-07-26 DIAGNOSIS — Z4781 Encounter for orthopedic aftercare following surgical amputation: Secondary | ICD-10-CM | POA: Diagnosis not present

## 2020-07-26 DIAGNOSIS — N186 End stage renal disease: Secondary | ICD-10-CM | POA: Diagnosis not present

## 2020-07-26 DIAGNOSIS — R262 Difficulty in walking, not elsewhere classified: Secondary | ICD-10-CM | POA: Diagnosis not present

## 2020-07-26 DIAGNOSIS — I120 Hypertensive chronic kidney disease with stage 5 chronic kidney disease or end stage renal disease: Secondary | ICD-10-CM | POA: Diagnosis not present

## 2020-07-27 DIAGNOSIS — Z992 Dependence on renal dialysis: Secondary | ICD-10-CM | POA: Diagnosis not present

## 2020-07-27 DIAGNOSIS — N186 End stage renal disease: Secondary | ICD-10-CM | POA: Diagnosis not present

## 2020-07-30 DIAGNOSIS — D509 Iron deficiency anemia, unspecified: Secondary | ICD-10-CM | POA: Diagnosis not present

## 2020-07-30 DIAGNOSIS — N186 End stage renal disease: Secondary | ICD-10-CM | POA: Diagnosis not present

## 2020-07-30 DIAGNOSIS — Z992 Dependence on renal dialysis: Secondary | ICD-10-CM | POA: Diagnosis not present

## 2020-07-30 DIAGNOSIS — N2581 Secondary hyperparathyroidism of renal origin: Secondary | ICD-10-CM | POA: Diagnosis not present

## 2020-08-01 DIAGNOSIS — N186 End stage renal disease: Secondary | ICD-10-CM | POA: Diagnosis not present

## 2020-08-01 DIAGNOSIS — Z992 Dependence on renal dialysis: Secondary | ICD-10-CM | POA: Diagnosis not present

## 2020-08-02 DIAGNOSIS — Z89431 Acquired absence of right foot: Secondary | ICD-10-CM | POA: Diagnosis not present

## 2020-08-02 DIAGNOSIS — Z794 Long term (current) use of insulin: Secondary | ICD-10-CM | POA: Diagnosis not present

## 2020-08-02 DIAGNOSIS — R262 Difficulty in walking, not elsewhere classified: Secondary | ICD-10-CM | POA: Diagnosis not present

## 2020-08-02 DIAGNOSIS — I120 Hypertensive chronic kidney disease with stage 5 chronic kidney disease or end stage renal disease: Secondary | ICD-10-CM | POA: Diagnosis not present

## 2020-08-02 DIAGNOSIS — N186 End stage renal disease: Secondary | ICD-10-CM | POA: Diagnosis not present

## 2020-08-02 DIAGNOSIS — Z992 Dependence on renal dialysis: Secondary | ICD-10-CM | POA: Diagnosis not present

## 2020-08-02 DIAGNOSIS — E1122 Type 2 diabetes mellitus with diabetic chronic kidney disease: Secondary | ICD-10-CM | POA: Diagnosis not present

## 2020-08-02 DIAGNOSIS — I70203 Unspecified atherosclerosis of native arteries of extremities, bilateral legs: Secondary | ICD-10-CM | POA: Diagnosis not present

## 2020-08-02 DIAGNOSIS — Z4781 Encounter for orthopedic aftercare following surgical amputation: Secondary | ICD-10-CM | POA: Diagnosis not present

## 2020-08-03 DIAGNOSIS — Z992 Dependence on renal dialysis: Secondary | ICD-10-CM | POA: Diagnosis not present

## 2020-08-03 DIAGNOSIS — N186 End stage renal disease: Secondary | ICD-10-CM | POA: Diagnosis not present

## 2020-08-06 DIAGNOSIS — N186 End stage renal disease: Secondary | ICD-10-CM | POA: Diagnosis not present

## 2020-08-06 DIAGNOSIS — Z992 Dependence on renal dialysis: Secondary | ICD-10-CM | POA: Diagnosis not present

## 2020-08-07 ENCOUNTER — Encounter: Payer: Self-pay | Admitting: Physician Assistant

## 2020-08-07 ENCOUNTER — Ambulatory Visit: Payer: Medicare Other | Admitting: Physician Assistant

## 2020-08-07 DIAGNOSIS — E118 Type 2 diabetes mellitus with unspecified complications: Secondary | ICD-10-CM

## 2020-08-07 DIAGNOSIS — D510 Vitamin B12 deficiency anemia due to intrinsic factor deficiency: Secondary | ICD-10-CM | POA: Diagnosis not present

## 2020-08-07 NOTE — Progress Notes (Signed)
Office Visit Note   Patient: [name]           Date of Birth: 10/13/1940           MRN: [id number] Visit Date: 08/07/2020              Requested by: [name], MD [address] [place],  [place] [address] PCP: [name], MD  No chief complaint on file.     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  She also has a history of onychomycotic nails.  These were trimmed a month ago.  She is doing well and going to receive her filler for her shoe  Assessment & Plan: Visit Diagnoses: No diagnosis found.  Plan: Emphasized the importance of checking her feet every day she may come in whenever she needs to to get her nails trimmed she will make an appointment for 2 months out.  Follow-Up Instructions: No follow-ups on file.   Ortho Exam  Patient is alert, oriented, no adenopathy, well-dressed, normal affect, normal respiratory effort. Focused examination demonstrates well-healed surgical incision small eschar at the distal end of the wound but no surrounding cellulitis or swelling.  Nails are currently well trimmed.  No signs of infection  Imaging: No results found. No images are attached to the encounter.  Labs: Lab Results  Component Value Date   HGBA1C 7.7 (H) 01/05/2020   HGBA1C 7.5 (H) 10/14/2017   HGBA1C 9.8 (H) 06/01/2015   ESRSEDRATE 42 (H) 01/06/2020   CRP 20.3 (H) 01/06/2020   REPTSTATUS 01/10/2020 FINAL 01/05/2020   CULT  01/05/2020    NO GROWTH 5 DAYS Performed at [facility], [employer] [address] [place], [place] [address]      Lab Results  Component Value Date   ALBUMIN 3.5 03/23/2020   ALBUMIN 2.5 (L) 01/16/2020   ALBUMIN 2.9 (L) 01/06/2020    Lab Results  Component Value Date   MG 1.9 06/26/2013   MG 1.7 01/25/2009   No results found for: VD25OH  No results found for: PREALBUMIN CBC EXTENDED Latest Ref Rng & Units 03/23/2020 01/16/2020 01/13/2020  WBC 4.0 - 10.5 K/uL 8.7 17.4(H) -  RBC 3.87  - 5.11 MIL/uL 3.66(L) 2.99(L) -  HGB 12.0 - 15.0 g/dL 10.9(L) 9.2(L) 13.9  HCT 36.0 - 46.0 % 34.7(L) 27.1(L) 41.0  PLT 150 - 400 K/uL 161 161 -  NEUTROABS 1.7 - 7.7 K/uL - - -  LYMPHSABS 0.7 - 4.0 K/uL - - -     There is no height or weight on file to calculate BMI.  Orders:  No orders of the defined types were placed in this encounter.  No orders of the defined types were placed in this encounter.    Procedures: No procedures performed  Clinical Data: No additional findings.  ROS:  All other systems negative, except as noted in the HPI. Review of Systems  Objective: Vital Signs: There were no vitals taken for this visit.  Specialty Comments:  No specialty comments available.  PMFS History: Patient Active Problem List   Diagnosis Date Noted  . Gangrene of toe of right foot ([place])   . Cellulitis of right foot 01/05/2020  . History of CVA (cerebrovascular accident) 01/05/2020  . Thrombocytopenia ([place]) 01/05/2020  . Diabetic ulcer of right foot associated with diabetes mellitus due to underlying condition ([place]) 01/05/2020  . History of colonic polyps 04/01/2018  . Acute bronchitis 10/15/2017  . DM type  2, goal A1c below 7   . HCAP (healthcare-associated pneumonia) 05/29/2015  . Hyperkalemia 05/29/2015  . Diabetes mellitus with complication ([place])   . ESRD (end stage renal disease) on dialysis ([place])   . Encounter for adequacy testing for hemodialysis ([place]) 01/17/2014  . Mechanical complication of other vascular device, implant, and graft 01/17/2014  . Disturbances of vision, late effect of stroke 11/03/2013  . Right hemiparesis ([place]) 08/19/2013  . CVA (cerebral infarction) 07/20/2013  . Gastroparesis due to DM ([place]) 07/01/2013  . End stage renal disease ([place]) 06/25/2013  . Anemia 01/26/2013  . DM W/NEURO MNFST, TYPE II, UNCONTROLLED 11/19/2006  . Leukocytosis 11/19/2006  . DEPENDENT EDEMA, LEGS, BILATERAL 11/19/2006  . SINUS TACHYCARDIA 10/16/2006  . Diabetes  mellitus, type 2 ([place]) 05/20/2006  . HLD (hyperlipidemia) 05/20/2006  . SYNDROME, RESTLESS LEGS 05/20/2006  . Hereditary and idiopathic peripheral neuropathy 05/20/2006  . Hypertension associated with diabetes ([place]) 05/20/2006   Past Medical History:  Diagnosis Date  . Anemia   . Cataract   . Chronic kidney disease   . CVA (cerebral infarction)   . Diabetes mellitus    Type 2  . Diabetes mellitus without complication ([place])   . Dialysis patient ([place])   . Dialysis patient ([place])    M, W, F  . Fistula    R arm  . GERD (gastroesophageal reflux disease)   . Hypertension   . Renal disorder   . Shortness of breath   . Stroke [facility])    right side weakness    Family History  Problem Relation Age of Onset  . Cancer Sister   . Cancer Brother   . Anesthesia problems Neg Hx   . Hypotension Neg Hx   . Malignant hyperthermia Neg Hx   . Pseudochol deficiency Neg Hx     Past Surgical History:  Procedure Laterality Date  . AMPUTATION Right 01/13/2020   Procedure: AMPUTATION RIGHT FOOT 1ST RAY;  Surgeon: Newt Minion, MD;  Location: [place];  Service: Orthopedics;  Laterality: Right;  . AV FISTULA PLACEMENT Right 09/08/2013   Procedure: CREATION OF RIGHT BRACHIAL CEPHALIC ARTERIOVENOUS FISTULA ;  Surgeon: Mal Misty, MD;  Location: [place];  Service: Vascular;  Laterality: Right;  . BASCILIC VEIN TRANSPOSITION Right 01/26/2014   Procedure: Right Arm BASILIC VEIN TRANSPOSITION;  Surgeon: Mal Misty, MD;  Location: [place];  Service: Vascular;  Laterality: Right;  . CATARACT EXTRACTION W/PHACO  11/20/2011   Procedure: CATARACT EXTRACTION PHACO AND INTRAOCULAR LENS PLACEMENT (IOC);  Surgeon: Tonny Branch, MD;  [facility];  Service: Ophthalmology;  Laterality: Right;  CDE 18.82  . CATARACT EXTRACTION W/PHACO Left 11/18/2012   Procedure: CATARACT EXTRACTION PHACO AND INTRAOCULAR LENS PLACEMENT (IOC);  Surgeon: Tonny Branch, MD;  [facility];  Service: Ophthalmology;  Laterality: Left;  CDE:  18.97  . COLONOSCOPY N/A 02/09/2013   Procedure: COLONOSCOPY;  Surgeon: Rogene Houston, MD;  [facility];  Service: Endoscopy;  Laterality: N/A;  305-moved to 220 Ann to notify pt  . COLONOSCOPY N/A 07/08/2018   Procedure: COLONOSCOPY;  Surgeon: Rogene Houston, MD;  [facility];  Service: Endoscopy;  Laterality: N/A;  930  . INSERTION OF DIALYSIS CATHETER Right 06/24/2013   Procedure: INSERTION OF DIALYSIS CATHETER: Ultrasound guided;  Surgeon: Serafina Mitchell, MD;  Location: [place];  Service: Vascular;  Laterality: Right;  . LIGATION OF ARTERIOVENOUS  FISTULA Right 01/26/2014   Procedure: LIGATION OF ARTERIOVENOUS  FISTULA;  Surgeon: Mal Misty, MD;  Location: [place];  Service: Vascular;  Laterality: Right;  . 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 LOOP RECORDER IMPLANT N/A 07/21/2013   Procedure: LOOP RECORDER IMPLANT;  Surgeon: Evans Lance, MD;  Location: [facility] C[facility] LAB;  Service: Cardiovascular;  Laterality: N/A;  . POLYPECTOMY  07/08/2018   Procedure: POLYPECTOMY;  Surgeon: Rogene Houston, MD;  [facility];  Service: Endoscopy;;  Descending colon polyps x 2   . TEE WITHOUT CARDIOVERSION N/A 07/21/2013   Procedure: TRANSESOPHAGEAL ECHOCARDIOGRAM (TEE);  Surgeon: Dorothy Spark, MD;  Location: [facility] END[facility]COPY;  Service: Cardiovascular;  Laterality: N/A;   Social History   Occupational History  . Not on file  Tobacco Use  . Smoking status: Never Smoker  . Smokeless tobacco: Never Used  Vaping Use  . Vaping Use: Never used  Substance and Sexual Activity  . Alcohol use: No  . Drug use: No  . Sexual activity: Not on file

## 2020-08-08 DIAGNOSIS — N186 End stage renal disease: Secondary | ICD-10-CM | POA: Diagnosis not present

## 2020-08-08 DIAGNOSIS — Z992 Dependence on renal dialysis: Secondary | ICD-10-CM | POA: Diagnosis not present

## 2020-08-09 DIAGNOSIS — I70203 Unspecified atherosclerosis of native arteries of extremities, bilateral legs: Secondary | ICD-10-CM | POA: Diagnosis not present

## 2020-08-09 DIAGNOSIS — Z992 Dependence on renal dialysis: Secondary | ICD-10-CM | POA: Diagnosis not present

## 2020-08-09 DIAGNOSIS — Z89431 Acquired absence of right foot: Secondary | ICD-10-CM | POA: Diagnosis not present

## 2020-08-09 DIAGNOSIS — R262 Difficulty in walking, not elsewhere classified: Secondary | ICD-10-CM | POA: Diagnosis not present

## 2020-08-09 DIAGNOSIS — Z4781 Encounter for orthopedic aftercare following surgical amputation: Secondary | ICD-10-CM | POA: Diagnosis not present

## 2020-08-09 DIAGNOSIS — I120 Hypertensive chronic kidney disease with stage 5 chronic kidney disease or end stage renal disease: Secondary | ICD-10-CM | POA: Diagnosis not present

## 2020-08-09 DIAGNOSIS — Z794 Long term (current) use of insulin: Secondary | ICD-10-CM | POA: Diagnosis not present

## 2020-08-09 DIAGNOSIS — E1122 Type 2 diabetes mellitus with diabetic chronic kidney disease: Secondary | ICD-10-CM | POA: Diagnosis not present

## 2020-08-09 DIAGNOSIS — N186 End stage renal disease: Secondary | ICD-10-CM | POA: Diagnosis not present

## 2020-08-10 DIAGNOSIS — N186 End stage renal disease: Secondary | ICD-10-CM | POA: Diagnosis not present

## 2020-08-10 DIAGNOSIS — Z992 Dependence on renal dialysis: Secondary | ICD-10-CM | POA: Diagnosis not present

## 2020-08-13 DIAGNOSIS — Z992 Dependence on renal dialysis: Secondary | ICD-10-CM | POA: Diagnosis not present

## 2020-08-13 DIAGNOSIS — E1122 Type 2 diabetes mellitus with diabetic chronic kidney disease: Secondary | ICD-10-CM | POA: Diagnosis not present

## 2020-08-13 DIAGNOSIS — E519 Thiamine deficiency, unspecified: Secondary | ICD-10-CM | POA: Diagnosis not present

## 2020-08-13 DIAGNOSIS — N186 End stage renal disease: Secondary | ICD-10-CM | POA: Diagnosis not present

## 2020-08-13 DIAGNOSIS — I120 Hypertensive chronic kidney disease with stage 5 chronic kidney disease or end stage renal disease: Secondary | ICD-10-CM | POA: Diagnosis not present

## 2020-08-14 DIAGNOSIS — Z4781 Encounter for orthopedic aftercare following surgical amputation: Secondary | ICD-10-CM | POA: Diagnosis not present

## 2020-08-14 DIAGNOSIS — Z992 Dependence on renal dialysis: Secondary | ICD-10-CM | POA: Diagnosis not present

## 2020-08-14 DIAGNOSIS — Z794 Long term (current) use of insulin: Secondary | ICD-10-CM | POA: Diagnosis not present

## 2020-08-14 DIAGNOSIS — R262 Difficulty in walking, not elsewhere classified: Secondary | ICD-10-CM | POA: Diagnosis not present

## 2020-08-14 DIAGNOSIS — I70203 Unspecified atherosclerosis of native arteries of extremities, bilateral legs: Secondary | ICD-10-CM | POA: Diagnosis not present

## 2020-08-14 DIAGNOSIS — Z89431 Acquired absence of right foot: Secondary | ICD-10-CM | POA: Diagnosis not present

## 2020-08-14 DIAGNOSIS — E1122 Type 2 diabetes mellitus with diabetic chronic kidney disease: Secondary | ICD-10-CM | POA: Diagnosis not present

## 2020-08-14 DIAGNOSIS — I120 Hypertensive chronic kidney disease with stage 5 chronic kidney disease or end stage renal disease: Secondary | ICD-10-CM | POA: Diagnosis not present

## 2020-08-14 DIAGNOSIS — N186 End stage renal disease: Secondary | ICD-10-CM | POA: Diagnosis not present

## 2020-08-15 DIAGNOSIS — Z992 Dependence on renal dialysis: Secondary | ICD-10-CM | POA: Diagnosis not present

## 2020-08-15 DIAGNOSIS — N186 End stage renal disease: Secondary | ICD-10-CM | POA: Diagnosis not present

## 2020-08-17 DIAGNOSIS — Z992 Dependence on renal dialysis: Secondary | ICD-10-CM | POA: Diagnosis not present

## 2020-08-17 DIAGNOSIS — N186 End stage renal disease: Secondary | ICD-10-CM | POA: Diagnosis not present

## 2020-08-20 DIAGNOSIS — N186 End stage renal disease: Secondary | ICD-10-CM | POA: Diagnosis not present

## 2020-08-20 DIAGNOSIS — Z992 Dependence on renal dialysis: Secondary | ICD-10-CM | POA: Diagnosis not present

## 2020-08-21 DIAGNOSIS — Z89431 Acquired absence of right foot: Secondary | ICD-10-CM | POA: Diagnosis not present

## 2020-08-21 DIAGNOSIS — I70203 Unspecified atherosclerosis of native arteries of extremities, bilateral legs: Secondary | ICD-10-CM | POA: Diagnosis not present

## 2020-08-21 DIAGNOSIS — Z4781 Encounter for orthopedic aftercare following surgical amputation: Secondary | ICD-10-CM | POA: Diagnosis not present

## 2020-08-21 DIAGNOSIS — I120 Hypertensive chronic kidney disease with stage 5 chronic kidney disease or end stage renal disease: Secondary | ICD-10-CM | POA: Diagnosis not present

## 2020-08-21 DIAGNOSIS — N186 End stage renal disease: Secondary | ICD-10-CM | POA: Diagnosis not present

## 2020-08-21 DIAGNOSIS — Z992 Dependence on renal dialysis: Secondary | ICD-10-CM | POA: Diagnosis not present

## 2020-08-21 DIAGNOSIS — R262 Difficulty in walking, not elsewhere classified: Secondary | ICD-10-CM | POA: Diagnosis not present

## 2020-08-21 DIAGNOSIS — E1122 Type 2 diabetes mellitus with diabetic chronic kidney disease: Secondary | ICD-10-CM | POA: Diagnosis not present

## 2020-08-21 DIAGNOSIS — Z794 Long term (current) use of insulin: Secondary | ICD-10-CM | POA: Diagnosis not present

## 2020-08-22 DIAGNOSIS — N186 End stage renal disease: Secondary | ICD-10-CM | POA: Diagnosis not present

## 2020-08-22 DIAGNOSIS — Z992 Dependence on renal dialysis: Secondary | ICD-10-CM | POA: Diagnosis not present

## 2020-08-24 DIAGNOSIS — N186 End stage renal disease: Secondary | ICD-10-CM | POA: Diagnosis not present

## 2020-08-24 DIAGNOSIS — Z992 Dependence on renal dialysis: Secondary | ICD-10-CM | POA: Diagnosis not present

## 2020-08-27 DIAGNOSIS — D509 Iron deficiency anemia, unspecified: Secondary | ICD-10-CM | POA: Diagnosis not present

## 2020-08-27 DIAGNOSIS — Z992 Dependence on renal dialysis: Secondary | ICD-10-CM | POA: Diagnosis not present

## 2020-08-27 DIAGNOSIS — N186 End stage renal disease: Secondary | ICD-10-CM | POA: Diagnosis not present

## 2020-08-27 DIAGNOSIS — N2581 Secondary hyperparathyroidism of renal origin: Secondary | ICD-10-CM | POA: Diagnosis not present

## 2020-08-28 DIAGNOSIS — Z89411 Acquired absence of right great toe: Secondary | ICD-10-CM | POA: Diagnosis not present

## 2020-08-28 DIAGNOSIS — D510 Vitamin B12 deficiency anemia due to intrinsic factor deficiency: Secondary | ICD-10-CM | POA: Diagnosis not present

## 2020-08-28 DIAGNOSIS — E119 Type 2 diabetes mellitus without complications: Secondary | ICD-10-CM | POA: Diagnosis not present

## 2020-08-29 DIAGNOSIS — Z992 Dependence on renal dialysis: Secondary | ICD-10-CM | POA: Diagnosis not present

## 2020-08-29 DIAGNOSIS — N186 End stage renal disease: Secondary | ICD-10-CM | POA: Diagnosis not present

## 2020-08-30 DIAGNOSIS — Z992 Dependence on renal dialysis: Secondary | ICD-10-CM | POA: Diagnosis not present

## 2020-08-30 DIAGNOSIS — N186 End stage renal disease: Secondary | ICD-10-CM | POA: Diagnosis not present

## 2020-08-30 DIAGNOSIS — I70203 Unspecified atherosclerosis of native arteries of extremities, bilateral legs: Secondary | ICD-10-CM | POA: Diagnosis not present

## 2020-08-30 DIAGNOSIS — E1122 Type 2 diabetes mellitus with diabetic chronic kidney disease: Secondary | ICD-10-CM | POA: Diagnosis not present

## 2020-08-30 DIAGNOSIS — R262 Difficulty in walking, not elsewhere classified: Secondary | ICD-10-CM | POA: Diagnosis not present

## 2020-08-30 DIAGNOSIS — Z794 Long term (current) use of insulin: Secondary | ICD-10-CM | POA: Diagnosis not present

## 2020-08-30 DIAGNOSIS — Z89431 Acquired absence of right foot: Secondary | ICD-10-CM | POA: Diagnosis not present

## 2020-08-30 DIAGNOSIS — Z4781 Encounter for orthopedic aftercare following surgical amputation: Secondary | ICD-10-CM | POA: Diagnosis not present

## 2020-08-30 DIAGNOSIS — I120 Hypertensive chronic kidney disease with stage 5 chronic kidney disease or end stage renal disease: Secondary | ICD-10-CM | POA: Diagnosis not present

## 2020-08-31 DIAGNOSIS — N186 End stage renal disease: Secondary | ICD-10-CM | POA: Diagnosis not present

## 2020-08-31 DIAGNOSIS — Z992 Dependence on renal dialysis: Secondary | ICD-10-CM | POA: Diagnosis not present

## 2020-09-03 DIAGNOSIS — N186 End stage renal disease: Secondary | ICD-10-CM | POA: Diagnosis not present

## 2020-09-03 DIAGNOSIS — Z992 Dependence on renal dialysis: Secondary | ICD-10-CM | POA: Diagnosis not present

## 2020-09-04 DIAGNOSIS — Z794 Long term (current) use of insulin: Secondary | ICD-10-CM | POA: Diagnosis not present

## 2020-09-04 DIAGNOSIS — R262 Difficulty in walking, not elsewhere classified: Secondary | ICD-10-CM | POA: Diagnosis not present

## 2020-09-04 DIAGNOSIS — N186 End stage renal disease: Secondary | ICD-10-CM | POA: Diagnosis not present

## 2020-09-04 DIAGNOSIS — I70203 Unspecified atherosclerosis of native arteries of extremities, bilateral legs: Secondary | ICD-10-CM | POA: Diagnosis not present

## 2020-09-04 DIAGNOSIS — I120 Hypertensive chronic kidney disease with stage 5 chronic kidney disease or end stage renal disease: Secondary | ICD-10-CM | POA: Diagnosis not present

## 2020-09-04 DIAGNOSIS — Z89431 Acquired absence of right foot: Secondary | ICD-10-CM | POA: Diagnosis not present

## 2020-09-04 DIAGNOSIS — E1122 Type 2 diabetes mellitus with diabetic chronic kidney disease: Secondary | ICD-10-CM | POA: Diagnosis not present

## 2020-09-04 DIAGNOSIS — Z4781 Encounter for orthopedic aftercare following surgical amputation: Secondary | ICD-10-CM | POA: Diagnosis not present

## 2020-09-04 DIAGNOSIS — Z992 Dependence on renal dialysis: Secondary | ICD-10-CM | POA: Diagnosis not present

## 2020-09-05 DIAGNOSIS — Z992 Dependence on renal dialysis: Secondary | ICD-10-CM | POA: Diagnosis not present

## 2020-09-05 DIAGNOSIS — N186 End stage renal disease: Secondary | ICD-10-CM | POA: Diagnosis not present

## 2020-09-07 DIAGNOSIS — N186 End stage renal disease: Secondary | ICD-10-CM | POA: Diagnosis not present

## 2020-09-07 DIAGNOSIS — Z992 Dependence on renal dialysis: Secondary | ICD-10-CM | POA: Diagnosis not present

## 2020-09-10 DIAGNOSIS — Z992 Dependence on renal dialysis: Secondary | ICD-10-CM | POA: Diagnosis not present

## 2020-09-10 DIAGNOSIS — N186 End stage renal disease: Secondary | ICD-10-CM | POA: Diagnosis not present

## 2020-09-12 DIAGNOSIS — Z992 Dependence on renal dialysis: Secondary | ICD-10-CM | POA: Diagnosis not present

## 2020-09-12 DIAGNOSIS — N186 End stage renal disease: Secondary | ICD-10-CM | POA: Diagnosis not present

## 2020-09-13 DIAGNOSIS — I120 Hypertensive chronic kidney disease with stage 5 chronic kidney disease or end stage renal disease: Secondary | ICD-10-CM | POA: Diagnosis not present

## 2020-09-13 DIAGNOSIS — I70203 Unspecified atherosclerosis of native arteries of extremities, bilateral legs: Secondary | ICD-10-CM | POA: Diagnosis not present

## 2020-09-13 DIAGNOSIS — Z4781 Encounter for orthopedic aftercare following surgical amputation: Secondary | ICD-10-CM | POA: Diagnosis not present

## 2020-09-13 DIAGNOSIS — Z992 Dependence on renal dialysis: Secondary | ICD-10-CM | POA: Diagnosis not present

## 2020-09-13 DIAGNOSIS — R262 Difficulty in walking, not elsewhere classified: Secondary | ICD-10-CM | POA: Diagnosis not present

## 2020-09-13 DIAGNOSIS — Z89431 Acquired absence of right foot: Secondary | ICD-10-CM | POA: Diagnosis not present

## 2020-09-13 DIAGNOSIS — N186 End stage renal disease: Secondary | ICD-10-CM | POA: Diagnosis not present

## 2020-09-13 DIAGNOSIS — Z794 Long term (current) use of insulin: Secondary | ICD-10-CM | POA: Diagnosis not present

## 2020-09-13 DIAGNOSIS — E1122 Type 2 diabetes mellitus with diabetic chronic kidney disease: Secondary | ICD-10-CM | POA: Diagnosis not present

## 2020-09-14 DIAGNOSIS — N186 End stage renal disease: Secondary | ICD-10-CM | POA: Diagnosis not present

## 2020-09-14 DIAGNOSIS — Z992 Dependence on renal dialysis: Secondary | ICD-10-CM | POA: Diagnosis not present

## 2020-09-17 DIAGNOSIS — N186 End stage renal disease: Secondary | ICD-10-CM | POA: Diagnosis not present

## 2020-09-17 DIAGNOSIS — Z992 Dependence on renal dialysis: Secondary | ICD-10-CM | POA: Diagnosis not present

## 2020-09-18 DIAGNOSIS — E1122 Type 2 diabetes mellitus with diabetic chronic kidney disease: Secondary | ICD-10-CM | POA: Diagnosis not present

## 2020-09-18 DIAGNOSIS — I10 Essential (primary) hypertension: Secondary | ICD-10-CM | POA: Diagnosis not present

## 2020-09-18 DIAGNOSIS — N186 End stage renal disease: Secondary | ICD-10-CM | POA: Diagnosis not present

## 2020-09-18 DIAGNOSIS — D510 Vitamin B12 deficiency anemia due to intrinsic factor deficiency: Secondary | ICD-10-CM | POA: Diagnosis not present

## 2020-09-18 DIAGNOSIS — Z794 Long term (current) use of insulin: Secondary | ICD-10-CM | POA: Diagnosis not present

## 2020-09-18 DIAGNOSIS — Z7189 Other specified counseling: Secondary | ICD-10-CM | POA: Diagnosis not present

## 2020-09-18 DIAGNOSIS — E1142 Type 2 diabetes mellitus with diabetic polyneuropathy: Secondary | ICD-10-CM | POA: Diagnosis not present

## 2020-09-18 DIAGNOSIS — E1165 Type 2 diabetes mellitus with hyperglycemia: Secondary | ICD-10-CM | POA: Diagnosis not present

## 2020-09-18 DIAGNOSIS — Z89411 Acquired absence of right great toe: Secondary | ICD-10-CM | POA: Diagnosis not present

## 2020-09-19 DIAGNOSIS — Z992 Dependence on renal dialysis: Secondary | ICD-10-CM | POA: Diagnosis not present

## 2020-09-19 DIAGNOSIS — N186 End stage renal disease: Secondary | ICD-10-CM | POA: Diagnosis not present

## 2020-09-20 ENCOUNTER — Encounter (HOSPITAL_COMMUNITY): Payer: Self-pay | Admitting: *Deleted

## 2020-09-20 ENCOUNTER — Other Ambulatory Visit: Payer: Self-pay

## 2020-09-20 ENCOUNTER — Emergency Department (HOSPITAL_COMMUNITY): Payer: Medicare Other

## 2020-09-20 ENCOUNTER — Emergency Department (HOSPITAL_COMMUNITY)
Admission: EM | Admit: 2020-09-20 | Discharge: 2020-09-20 | Disposition: A | Discharge status: Home / Self Care | Payer: Medicare Other | Attending: Emergency Medicine | Admitting: Emergency Medicine

## 2020-09-20 DIAGNOSIS — J9811 Atelectasis: Secondary | ICD-10-CM | POA: Diagnosis not present

## 2020-09-20 DIAGNOSIS — R0781 Pleurodynia: Secondary | ICD-10-CM | POA: Insufficient documentation

## 2020-09-20 DIAGNOSIS — Z992 Dependence on renal dialysis: Secondary | ICD-10-CM | POA: Diagnosis not present

## 2020-09-20 DIAGNOSIS — Z794 Long term (current) use of insulin: Secondary | ICD-10-CM | POA: Insufficient documentation

## 2020-09-20 DIAGNOSIS — E875 Hyperkalemia: Secondary | ICD-10-CM | POA: Diagnosis not present

## 2020-09-20 DIAGNOSIS — I120 Hypertensive chronic kidney disease with stage 5 chronic kidney disease or end stage renal disease: Secondary | ICD-10-CM | POA: Diagnosis not present

## 2020-09-20 DIAGNOSIS — K449 Diaphragmatic hernia without obstruction or gangrene: Secondary | ICD-10-CM | POA: Diagnosis not present

## 2020-09-20 DIAGNOSIS — N186 End stage renal disease: Secondary | ICD-10-CM | POA: Diagnosis not present

## 2020-09-20 DIAGNOSIS — L97519 Non-pressure chronic ulcer of other part of right foot with unspecified severity: Secondary | ICD-10-CM | POA: Diagnosis not present

## 2020-09-20 DIAGNOSIS — Z7982 Long term (current) use of aspirin: Secondary | ICD-10-CM | POA: Insufficient documentation

## 2020-09-20 DIAGNOSIS — Z79899 Other long term (current) drug therapy: Secondary | ICD-10-CM | POA: Diagnosis not present

## 2020-09-20 DIAGNOSIS — I2510 Atherosclerotic heart disease of native coronary artery without angina pectoris: Secondary | ICD-10-CM | POA: Diagnosis not present

## 2020-09-20 DIAGNOSIS — E11621 Type 2 diabetes mellitus with foot ulcer: Secondary | ICD-10-CM | POA: Diagnosis not present

## 2020-09-20 DIAGNOSIS — J90 Pleural effusion, not elsewhere classified: Secondary | ICD-10-CM | POA: Diagnosis not present

## 2020-09-20 DIAGNOSIS — I10 Essential (primary) hypertension: Secondary | ICD-10-CM | POA: Diagnosis not present

## 2020-09-20 LAB — COMPREHENSIVE METABOLIC PANEL
ALT: 18 U/L (ref 0–44)
AST: 20 U/L (ref 15–41)
Albumin: 3.6 g/dL (ref 3.5–5.0)
Alkaline Phosphatase: 92 U/L (ref 38–126)
Anion gap: 13 (ref 5–15)
BUN: 46 mg/dL — ABNORMAL HIGH (ref 8–23)
CO2: 28 mmol/L (ref 22–32)
Calcium: 9 mg/dL (ref 8.9–10.3)
Chloride: 98 mmol/L (ref 98–111)
Creatinine, Ser: 6.64 mg/dL — ABNORMAL HIGH (ref 0.44–1.00)
GFR, Estimated: 6 mL/min — ABNORMAL LOW (ref >60)
Glucose, Bld: 304 mg/dL — ABNORMAL HIGH (ref 70–99)
Potassium: 5.2 mmol/L — ABNORMAL HIGH (ref 3.5–5.1)
Sodium: 139 mmol/L (ref 135–145)
Total Bilirubin: 1.1 mg/dL (ref 0.3–1.2)
Total Protein: 7.7 g/dL (ref 6.5–8.1)

## 2020-09-20 LAB — CBC WITH DIFFERENTIAL/PLATELET
Abs Immature Granulocytes: 0.02 10*3/uL (ref 0.00–0.07)
Basophils Absolute: 0 10*3/uL (ref 0.0–0.1)
Basophils Relative: 0 %
Eosinophils Absolute: 0.1 10*3/uL (ref 0.0–0.5)
Eosinophils Relative: 2 %
HCT: 35.7 % — ABNORMAL LOW (ref 36.0–46.0)
Hemoglobin: 11.5 g/dL — ABNORMAL LOW (ref 12.0–15.0)
Immature Granulocytes: 0 %
Lymphocytes Relative: 13 %
Lymphs Abs: 1.1 10*3/uL (ref 0.7–4.0)
MCH: 31 pg (ref 26.0–34.0)
MCHC: 32.2 g/dL (ref 30.0–36.0)
MCV: 96.2 fL (ref 80.0–100.0)
Monocytes Absolute: 1.2 10*3/uL — ABNORMAL HIGH (ref 0.1–1.0)
Monocytes Relative: 14 %
Neutro Abs: 6.2 10*3/uL (ref 1.7–7.7)
Neutrophils Relative %: 71 %
Platelets: 94 10*3/uL — ABNORMAL LOW (ref 150–400)
RBC: 3.71 MIL/uL — ABNORMAL LOW (ref 3.87–5.11)
RDW: 16 % — ABNORMAL HIGH (ref 11.5–15.5)
WBC: 8.7 10*3/uL (ref 4.0–10.5)
nRBC: 0 % (ref 0.0–0.2)

## 2020-09-20 MED ORDER — SODIUM ZIRCONIUM CYCLOSILICATE 5 G PO PACK
10.0000 g | PACK | Freq: Once | ORAL | Status: AC
  Administered 2020-09-20: 10 g via ORAL
  Filled 2020-09-20: qty 2

## 2020-09-20 NOTE — Discharge Instructions (Addendum)
Your workup was very reassuring at this time. I would recommend taking Tylenol as needed for pain and to apply OTC diclofenac for pain  Please follow up with your PCP for further evaluation  Go to dialysis as scheduled for tomorrow

## 2020-09-20 NOTE — ED Notes (Signed)
Pt transported to xray 

## 2020-09-20 NOTE — ED Provider Notes (Signed)
[facility] EMERGENCY DEPARTMENT Provider Note   CSN: [id number] Arrival date & time: 09/20/20  1238     History Chief Complaint  Patient presents with  . Flank Pain    [name] is a 80 y.o. female with PMHx Diabetes, ESRD on Dialysis MWF, HTN, who presents to the ED today with complaint of gradual onset, constant, sharp/stabbing, severe, right rib pain that began 3-4 days ago. No trauma. Pt has not taken anything for pain. She denies similar symptoms in the past. She reports she still makes a very small amount of urine however denies dysuria, frequency, urgency, hematuria. Denies fevers, chills, cough, chest pain, SOB, nausea, vomiting, diarrhea, or any other associated symptoms.   The history is provided by the patient and medical records.       Past Medical History:  Diagnosis Date  . Anemia   . Cataract   . Chronic kidney disease   . CVA (cerebral infarction)   . Diabetes mellitus    Type 2  . Diabetes mellitus without complication ([place])   . Dialysis patient ([place])   . Dialysis patient ([place])    M, W, F  . Fistula    R arm  . GERD (gastroesophageal reflux disease)   . Hypertension   . Renal disorder   . Shortness of breath   . Stroke [facility])    right side weakness    Patient Active Problem List   Diagnosis Date Noted  . Gangrene of toe of right foot ([place])   . Cellulitis of right foot 01/05/2020  . History of CVA (cerebrovascular accident) 01/05/2020  . Thrombocytopenia ([place]) 01/05/2020  . Diabetic ulcer of right foot associated with diabetes mellitus due to underlying condition ([place]) 01/05/2020  . History of colonic polyps 04/01/2018  . Acute bronchitis 10/15/2017  . DM type 2, goal A1c below 7   . HCAP (healthcare-associated pneumonia) 05/29/2015  . Hyperkalemia 05/29/2015  . Diabetes mellitus with complication ([place])   . ESRD (end stage renal disease) on dialysis ([place])   . Encounter for adequacy testing for hemodialysis ([place]) 01/17/2014  .  Mechanical complication of other vascular device, implant, and graft 01/17/2014  . Disturbances of vision, late effect of stroke 11/03/2013  . Right hemiparesis ([place]) 08/19/2013  . CVA (cerebral infarction) 07/20/2013  . Gastroparesis due to DM ([place]) 07/01/2013  . End stage renal disease ([place]) 06/25/2013  . Anemia 01/26/2013  . DM W/NEURO MNFST, TYPE II, UNCONTROLLED 11/19/2006  . Leukocytosis 11/19/2006  . DEPENDENT EDEMA, LEGS, BILATERAL 11/19/2006  . SINUS TACHYCARDIA 10/16/2006  . Diabetes mellitus, type 2 ([place]) 05/20/2006  . HLD (hyperlipidemia) 05/20/2006  . SYNDROME, RESTLESS LEGS 05/20/2006  . Hereditary and idiopathic peripheral neuropathy 05/20/2006  . Hypertension associated with diabetes ([place]) 05/20/2006    Past Surgical History:  Procedure Laterality Date  . AMPUTATION Right 01/13/2020   Procedure: AMPUTATION RIGHT FOOT 1ST RAY;  Surgeon: Newt Minion, MD;  Location: [place];  Service: Orthopedics;  Laterality: Right;  . AV FISTULA PLACEMENT Right 09/08/2013   Procedure: CREATION OF RIGHT BRACHIAL CEPHALIC ARTERIOVENOUS FISTULA ;  Surgeon: Mal Misty, MD;  Location: [place];  Service: Vascular;  Laterality: Right;  . BASCILIC VEIN TRANSPOSITION Right 01/26/2014   Procedure: Right Arm BASILIC VEIN TRANSPOSITION;  Surgeon: Mal Misty, MD;  Location: [place];  Service: Vascular;  Laterality: Right;  . CATARACT EXTRACTION W/PHACO  11/20/2011   Procedure: CATARACT EXTRACTION PHACO AND INTRAOCULAR LENS PLACEMENT (IOC);  Surgeon: Tonny Branch,  MD;  [facility];  Service: Ophthalmology;  Laterality: Right;  CDE 18.82  . CATARACT EXTRACTION W/PHACO Left 11/18/2012   Procedure: CATARACT EXTRACTION PHACO AND INTRAOCULAR LENS PLACEMENT (IOC);  Surgeon: Tonny Branch, MD;  [facility];  Service: Ophthalmology;  Laterality: Left;  CDE: 18.97  . COLONOSCOPY N/A 02/09/2013   Procedure: COLONOSCOPY;  Surgeon: Rogene Houston, MD;  [facility];  Service: Endoscopy;  Laterality:  N/A;  305-moved to 220 Ann to notify pt  . COLONOSCOPY N/A 07/08/2018   Procedure: COLONOSCOPY;  Surgeon: Rogene Houston, MD;  [facility];  Service: Endoscopy;  Laterality: N/A;  930  . INSERTION OF DIALYSIS CATHETER Right 06/24/2013   Procedure: INSERTION OF DIALYSIS CATHETER: Ultrasound guided;  Surgeon: Serafina Mitchell, MD;  Location: [place];  Service: Vascular;  Laterality: Right;  . LIGATION OF ARTERIOVENOUS  FISTULA Right 01/26/2014   Procedure: LIGATION OF ARTERIOVENOUS  FISTULA;  Surgeon: Mal Misty, MD;  Location: [place];  Service: Vascular;  Laterality: Right;  . 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 LOOP RECORDER IMPLANT N/A 07/21/2013   Procedure: LOOP RECORDER IMPLANT;  Surgeon: Evans Lance, MD;  Location: [facility] CATH LAB;  Service: Cardiovascular;  Laterality: N/A;  . POLYPECTOMY  07/08/2018   Procedure: POLYPECTOMY;  Surgeon: Rogene Houston, MD;  [facility];  Service: Endoscopy;;  Descending colon polyps x 2   . TEE WITHOUT CARDIOVERSION N/A 07/21/2013   Procedure: TRANSESOPHAGEAL ECHOCARDIOGRAM (TEE);  Surgeon: Dorothy Spark, MD;  Location: [facility] ENDOSCOPY;  Service: Cardiovascular;  Laterality: N/A;     OB History    Gravida  0   Para  0   Term  0   Preterm  0   AB  0   Living        SAB  0   IAB  0   Ectopic  0   Multiple      Live Births              Family History  Problem Relation Age of Onset  . Cancer Sister   . Cancer Brother   . Anesthesia problems Neg Hx   . Hypotension Neg Hx   . Malignant hyperthermia Neg Hx   . Pseudochol deficiency Neg Hx     Social History   Tobacco Use  . Smoking status: Never Smoker  . Smokeless tobacco: Never Used  Vaping Use  . Vaping Use: Never used  Substance Use Topics  . Alcohol use: No  . Drug use: No    Home Medications Prior to Admission medications   Medication Sig Start Date End Date Taking? Authorizing Provider   albuterol (PROVENTIL HFA;VENTOLIN HFA) 108 (90 Base) MCG/ACT inhaler Inhale 2 puffs into the lungs every 6 (six) hours as needed for wheezing or shortness of breath. 10/16/17   Kathie Dike, MD  aspirin 81 MG chewable tablet Chew 2 tablets (162 mg total) by mouth daily. 07/09/18   Rogene Houston, MD  BD INSULIN SYRINGE U/F 31G X 5/16" 0.3 ML MISC 2 (two) times daily. as directed 09/15/18   [provider]  benzonatate (TESSALON) 100 MG capsule Take 1 capsule (100 mg total) by mouth every 8 (eight) hours. 06/28/20   Faustino Congress, NP  cyanocobalamin (,VITAMIN B-12,) 1000 MCG/ML injection Inject 1 mL into the muscle every 30 (thirty) days. 11/01/14   [provider]  gabapentin (NEURONTIN) 100 MG capsule Take 1 capsule (100 mg total) by mouth 3 (three) times daily. 04/12/20   Avegno, Darrelyn Hillock, FNP  insulin NPH Human (NOVOLIN N) 100 UNIT/ML injection Inject 0.07 mLs (7 Units total) into the skin 2 (two) times daily before a meal. 01/17/20   Donne Hazel, MD  labetalol (NORMODYNE) 100 MG tablet Take 1 tablet (100 mg total) by mouth 2 (two) times daily. 01/17/20 02/16/20  Donne Hazel, MD  labetalol (NORMODYNE) 200 MG tablet Take 200 mg by mouth 2 (two) times daily. 03/22/20   [provider]  lidocaine-prilocaine (EMLA) cream Apply 1 application topically every Monday, Wednesday, and Friday.  07/01/15   [provider]  multivitamin (RENA-VIT) TABS tablet Take 1 tablet by mouth at bedtime. 08/21/14   Kirsteins, Luanna Salk, MD  nitroGLYCERIN (NITRODUR - DOSED IN MG/24 HR) 0.2 mg/hr patch Place 1 patch (0.2 mg total) onto the skin daily. (For Foot) 04/24/20   Newt Minion, MD  omeprazole (PRILOSEC) 20 MG capsule Take 1 capsule (20 mg total) by mouth daily. 03/24/20   Maudie Flakes, MD  oxyCODONE (OXY IR/ROXICODONE) 5 MG immediate release tablet Take 1 tablet (5 mg total) by mouth every 8 (eight) hours as needed for severe pain. 03/15/20   Persons, Bevely Palmer, PA   pentoxifylline (TRENTAL) 400 MG CR tablet Take 1 tablet (400 mg total) by mouth daily with breakfast. 02/22/20   Persons, Bevely Palmer, PA  sevelamer carbonate (RENVELA) 800 MG tablet Take 800 mg by mouth 3 (three) times daily with meals.  12/24/17   [provider]  silver sulfADIAZINE (SILVADENE) 1 % cream Apply 1 application topically daily. 03/14/20   Suzan Slick, NP  sucralfate (CARAFATE) 1 g tablet Take 1 tablet (1 g total) by mouth 4 (four) times daily as needed. 03/24/20   Maudie Flakes, MD    Allergies    Ambien [zolpidem] and Reglan [metoclopramide]  Review of Systems   Review of Systems  Constitutional: Negative for chills and fever.  Respiratory: Negative for shortness of breath.   Cardiovascular: Positive for chest pain (right rib pain).  Gastrointestinal: Negative for abdominal pain, diarrhea, nausea and vomiting.  Genitourinary: Negative for dysuria and frequency.  All other systems reviewed and are negative.   Physical Exam Updated Vital Signs BP (!) 198/75   Pulse 78   Temp 98.1 F (36.7 C) (Oral)   Resp 18   SpO2 98%   Physical Exam Vitals and nursing note reviewed.  Constitutional:      Appearance: She is not ill-appearing or diaphoretic.  HENT:     Head: Normocephalic and atraumatic.  Eyes:     Conjunctiva/sclera: Conjunctivae normal.  Cardiovascular:     Rate and Rhythm: Normal rate and regular rhythm.  Pulmonary:     Effort: Pulmonary effort is normal.     Breath sounds: Normal breath sounds. No wheezing, rhonchi or rales.    Chest:     Chest wall: Tenderness present.  Abdominal:     Palpations: Abdomen is soft.     Tenderness: There is no abdominal tenderness. There is no right CVA tenderness, left CVA tenderness, guarding or rebound.  Musculoskeletal:     Cervical back: Neck supple.  Skin:    General: Skin is warm and dry.  Neurological:     Mental Status: She is alert.     ED Results / Procedures / Treatments   Labs (all labs  ordered are listed, but  only abnormal results are displayed) Labs Reviewed  COMPREHENSIVE METABOLIC PANEL - Abnormal; Notable for the following components:      Result Value   Potassium 5.2 (*)    Glucose, Bld 304 (*)    BUN 46 (*)    Creatinine, Ser 6.64 (*)    GFR, Estimated 6 (*)    All other components within normal limits  CBC WITH DIFFERENTIAL/PLATELET - Abnormal; Notable for the following components:   RBC 3.71 (*)    Hemoglobin 11.5 (*)    HCT 35.7 (*)    RDW 16.0 (*)    Platelets 94 (*)    Monocytes Absolute 1.2 (*)    All other components within normal limits  URINALYSIS, ROUTINE W REFLEX MICROSCOPIC    EKG None  Radiology DG Ribs Unilateral W/Chest Right  Result Date: 09/20/2020 CLINICAL DATA:  Chest and right posterior rib pain. EXAM: RIGHT RIBS AND CHEST - 3+ VIEW COMPARISON:  Chest x-ray 03/23/2020 FINDINGS: The heart is mildly enlarged but stable. A loop recorder is noted on the left side. The mediastinal and hilar contours are within normal limits and unchanged. The lungs are clear of an acute process. No pulmonary lesions or pleural effusions. Dedicated views of the right ribs do not demonstrate any definite acute right-sided rib fractures. IMPRESSION: No acute cardiopulmonary findings and no definite acute right-sided rib fractures. Electronically Signed   By: Marijo Sanes M.D.   On: 09/20/2020 14:43   CT Chest Wo Contrast  Result Date: 09/20/2020 CLINICAL DATA:  Chest pain. EXAM: CT CHEST WITHOUT CONTRAST TECHNIQUE: Multidetector CT imaging of the chest was performed following the standard protocol without IV contrast. COMPARISON:  None. FINDINGS: Cardiovascular: Atherosclerosis of thoracic aorta is noted without aneurysm formation. Mild cardiomegaly is noted. No pericardial effusion is noted. Coronary artery calcifications are noted. Mediastinum/Nodes: Small sliding-type hiatal hernia is noted. Thyroid gland is unremarkable. No adenopathy is noted. Lungs/Pleura: No  pneumothorax is noted. Minimal right pleural effusion is noted with adjacent subsegmental atelectasis. Upper Abdomen: No acute abnormality. Musculoskeletal: No chest wall mass or suspicious bone lesions identified. IMPRESSION: Small sliding-type hiatal hernia. Minimal right pleural effusion is noted with adjacent subsegmental atelectasis. Coronary artery calcifications are noted suggesting coronary artery disease. Aortic Atherosclerosis (ICD10-I70.0). Electronically Signed   By: Marijo Conception M.D.   On: 09/20/2020 16:19    Procedures Procedures   Medications Ordered in ED Medications  sodium zirconium cyclosilicate (LOKELMA) packet 10 g (10 g Oral Given 09/20/20 1613)    ED Course  I have reviewed the triage vital signs and the nursing notes.  Pertinent labs & imaging results that were available during my care of the patient were reviewed by me and considered in my medical decision making (see chart for details).    MDM Rules/Calculators/A&P                          80 year old female who presents to the ED today with complaint of atraumatic right rib pain for the past 3 to 4 days.  No other symptoms.  She last received dialysis yesterday however states she only completed about 1 hour and 8 minutes as there was disruption in the dialysis machine.  She denies any shortness of breath or feeling like she is fluid overloaded.  On arrival to the ED patient is afebrile, nontachycardic and nontachypneic.  Blood pressure is elevated 198/75 however appears to be baseline for patient.  On exam she does have  right lateral rib tenderness to palpation.  Will obtain x-ray at this time.  Given age and dialysis patient will also obtain EKG and labs.  She does report she makes a small amount of urine still, she denies any urinary symptoms however if she is able to provide urine will check today.    CXR clear without signs of rib fractures or other abnormalities CBC without leukocytosis. Hgb stable at 11.5 CMP  with creatinine 6.64 and potassium 5.2; will provide dose of lokelma at this time. Pt is scheduled to have dialysis tomorrow. Discussed with attending physician Dr. Roderic Palau who recommends the [facility] and does not think nephrology needs to be consulted at this time.   CT chest was ordered given unequivocal cxr: IMPRESSION:  Small sliding-type hiatal hernia.    Minimal right pleural effusion is noted with adjacent subsegmental  atelectasis.    Coronary artery calcifications are noted suggesting coronary artery  disease.    Aortic Atherosclerosis (ICD10-I70.0).   No obvious findings at this time. Would suspect pleural effusion in a dialysis patient. Will treat for musculoskeletal pain at this time and have pt follow up with PCP. She has not been able to produce urine however do not suspect pain is from UTI. Will discharge at this time.   This note was prepared using Dragon voice recognition software and may include unintentional dictation errors due to the inherent limitations of voice recognition software.  Final Clinical Impression(s) / ED Diagnoses Final diagnoses:  Rib pain on right side  Hyperkalemia    Rx / DC Orders ED Discharge Orders    None       Discharge Instructions     Your workup was very reassuring at this time. I would recommend taking Tylenol as needed for pain and to apply OTC diclofenac for pain  Please follow up with your PCP for further evaluation  Go to dialysis as scheduled for tomorrow       Eustaquio Maize, Hershal Coria 09/20/20 1630    Milton Ferguson, MD 09/21/20 1336

## 2020-09-20 NOTE — ED Triage Notes (Signed)
Pain in right flank area x 1 week

## 2020-09-21 DIAGNOSIS — N186 End stage renal disease: Secondary | ICD-10-CM | POA: Diagnosis not present

## 2020-09-21 DIAGNOSIS — Z992 Dependence on renal dialysis: Secondary | ICD-10-CM | POA: Diagnosis not present

## 2020-09-24 DIAGNOSIS — N186 End stage renal disease: Secondary | ICD-10-CM | POA: Diagnosis not present

## 2020-09-24 DIAGNOSIS — Z992 Dependence on renal dialysis: Secondary | ICD-10-CM | POA: Diagnosis not present

## 2020-09-25 ENCOUNTER — Ambulatory Visit: Payer: Medicare Other | Admitting: Physician Assistant

## 2020-09-25 ENCOUNTER — Encounter: Payer: Self-pay | Admitting: Physician Assistant

## 2020-09-25 DIAGNOSIS — E118 Type 2 diabetes mellitus with unspecified complications: Secondary | ICD-10-CM

## 2020-09-25 DIAGNOSIS — M25561 Pain in right knee: Secondary | ICD-10-CM | POA: Diagnosis not present

## 2020-09-25 MED ORDER — CYCLOBENZAPRINE HCL 10 MG PO TABS: 5.0000 mg | ORAL_TABLET | Freq: Three times a day (TID) | ORAL | 0 refills | Status: DC | PRN

## 2020-09-25 NOTE — Progress Notes (Signed)
Office Visit Note   Patient: [name]           Date of Birth: 1940/12/31           MRN: [id number] Visit Date: 09/25/2020              Requested by: [name], MD [address] [place],  [place] [address] PCP: [name], MD  No chief complaint on file.     HPI: Patient is a pleasant 80 year old woman who comes in today for periodic nail trimming she is also wondering about pain that she has in her thigh knee that goes up to her posterior buttock.  She denies any numbness or tingling she denies any weakness or giving way.  She denies that the pain starts in her buttock.  She says it feels better when she rubs on her thigh at night.  It does not affect her weightbearing  Assessment & Plan: Visit Diagnoses: No diagnosis found.  Plan: We will try her on a low-dose Flexeril muscle relaxant we will follow-up in 3 months if she continues to have pain I would have her follow-up sooner  Follow-Up Instructions: No follow-ups on file.   Ortho Exam  Patient is alert, oriented, no adenopathy, well-dressed, normal affect, normal respiratory effort. Feet are in good condition no open ulcers no ascending cellulitis onychomycotic nails x8 were trimmed to stable surfaces she has a negative straight leg raise no tenderness over the joint line of the knee no tenderness with flexion extension  Imaging: No results found. No images are attached to the encounter.  Labs: Lab Results  Component Value Date   HGBA1C 7.7 (H) 01/05/2020   HGBA1C 7.5 (H) 10/14/2017   HGBA1C 9.8 (H) 06/01/2015   ESRSEDRATE 42 (H) 01/06/2020   CRP 20.3 (H) 01/06/2020   REPTSTATUS 01/10/2020 FINAL 01/05/2020   CULT  01/05/2020    NO GROWTH 5 DAYS Performed at [facility], [place] [address] [place],  [address]      Lab Results  Component Value Date   ALBUMIN 3.6 09/20/2020   ALBUMIN 3.5 03/23/2020   ALBUMIN 2.5 (L) 01/16/2020    Lab Results  Component Value Date   MG 1.9  06/26/2013   MG 1.7 01/25/2009   No results found for: VD25OH  No results found for: PREALBUMIN CBC EXTENDED Latest Ref Rng & Units 09/20/2020 03/23/2020 01/16/2020  WBC 4.0 - 10.5 K/uL 8.7 8.7 17.4(H)  RBC 3.87 - 5.11 MIL/uL 3.71(L) 3.66(L) 2.99(L)  HGB 12.0 - 15.0 g/dL 11.5(L) 10.9(L) 9.2(L)  HCT 36.0 - 46.0 % 35.7(L) 34.7(L) 27.1(L)  PLT 150 - 400 K/uL 94(L) 161 161  NEUTROABS 1.7 - 7.7 K/uL 6.2 - -  LYMPHSABS 0.7 - 4.0 K/uL 1.1 - -     There is no height or weight on file to calculate BMI.  Orders:  No orders of the defined types were placed in this encounter.  Meds ordered this encounter  Medications  . cyclobenzaprine (FLEXERIL) 10 MG tablet    Sig: Take 0.5 tablets (5 mg total) by mouth 3 (three) times daily as needed for muscle spasms.    Dispense:  30 tablet    Refill:  0     Procedures: No procedures performed  Clinical Data: No additional findings.  ROS:  All other systems negative, except as noted in the HPI. Review of Systems  Objective: Vital Signs: There were no vitals taken for this visit.  Specialty Comments:  No specialty comments available.  PMFS History: Patient Active Problem List   Diagnosis Date Noted  . Gangrene of toe of right foot ([place])   . Cellulitis of right foot 01/05/2020  . History of CVA (cerebrovascular accident) 01/05/2020  . Thrombocytopenia ([place]) 01/05/2020  . Diabetic ulcer of right foot associated with diabetes mellitus due to underlying condition ([place]) 01/05/2020  . History of colonic polyps 04/01/2018  . Acute bronchitis 10/15/2017  . DM type 2, goal A1c below 7   . HCAP (healthcare-associated pneumonia) 05/29/2015  . Hyperkalemia 05/29/2015  . Diabetes mellitus with complication ([place])   . ESRD (end stage renal disease) on dialysis ([place])   . Encounter for adequacy testing for hemodialysis ([place]) 01/17/2014  . Mechanical complication of other vascular device, implant, and graft 01/17/2014  . Disturbances of vision, late  effect of stroke 11/03/2013  . Right hemiparesis ([place]) 08/19/2013  . CVA (cerebral infarction) 07/20/2013  . Gastroparesis due to DM ([place]) 07/01/2013  . End stage renal disease ([place]) 06/25/2013  . Anemia 01/26/2013  . DM W/NEURO MNFST, TYPE II, UNCONTROLLED 11/19/2006  . Leukocytosis 11/19/2006  . DEPENDENT EDEMA, LEGS, BILATERAL 11/19/2006  . SINUS TACHYCARDIA 10/16/2006  . Diabetes mellitus, type 2 ([place]) 05/20/2006  . HLD (hyperlipidemia) 05/20/2006  . SYNDROME, RESTLESS LEGS 05/20/2006  . Hereditary and idiopathic peripheral neuropathy 05/20/2006  . Hypertension associated with diabetes ([place]) 05/20/2006   Past Medical History:  Diagnosis Date  . Anemia   . Cataract   . Chronic kidney disease   . CVA (cerebral infarction)   . Diabetes mellitus    Type 2  . Diabetes mellitus without complication ([place])   . Dialysis patient ([place])   . Dialysis patient ([place])    M, W, F  . Fistula    R arm  . GERD (gastroesophageal reflux disease)   . Hypertension   . Renal disorder   . Shortness of breath   . Stroke [facility])    right side weakness    Family History  Problem Relation Age of Onset  . Cancer Sister   . Cancer Brother   . Anesthesia problems Neg Hx   . Hypotension Neg Hx   . Malignant hyperthermia Neg Hx   . Pseudochol deficiency Neg Hx     Past Surgical History:  Procedure Laterality Date  . AMPUTATION Right 01/13/2020   Procedure: AMPUTATION RIGHT FOOT 1ST RAY;  Surgeon: Newt Minion, MD;  Location: [place];  Service: Orthopedics;  Laterality: Right;  . AV FISTULA PLACEMENT Right 09/08/2013   Procedure: CREATION OF RIGHT BRACHIAL CEPHALIC ARTERIOVENOUS FISTULA ;  Surgeon: Mal Misty, MD;  Location: [place];  Service: Vascular;  Laterality: Right;  . BASCILIC VEIN TRANSPOSITION Right 01/26/2014   Procedure: Right Arm BASILIC VEIN TRANSPOSITION;  Surgeon: Mal Misty, MD;  Location: [place];  Service: Vascular;  Laterality: Right;  . CATARACT EXTRACTION W/PHACO  11/20/2011    Procedure: CATARACT EXTRACTION PHACO AND INTRAOCULAR LENS PLACEMENT (IOC);  Surgeon: Tonny Branch, MD;  Location: [facility];  Service: Ophthalmology;  Laterality: Right;  CDE 18.82  . CATARACT EXTRACTION W/PHACO Left 11/18/2012   Procedure: CATARACT EXTRACTION PHACO AND INTRAOCULAR LENS PLACEMENT (IOC);  Surgeon: Tonny Branch, MD;  Location: [facility];  Service: Ophthalmology;  Laterality: Left;  CDE: 18.97  . COLONOSCOPY N/A 02/09/2013   Procedure: COLONOSCOPY;  Surgeon: Rogene Houston, MD;  [facility];  Service: Endoscopy;  Laterality: N/A;  305-moved to 220 Ann to notify pt  .  COLONOSCOPY N/A 07/08/2018   Procedure: COLONOSCOPY;  Surgeon: Rogene Houston, MD;  [facility];  Service: Endoscopy;  Laterality: N/A;  930  . INSERTION OF DIALYSIS CATHETER Right 06/24/2013   Procedure: INSERTION OF DIALYSIS CATHETER: Ultrasound guided;  Surgeon: Serafina Mitchell, MD;  Location: [place];  Service: Vascular;  Laterality: Right;  . LIGATION OF ARTERIOVENOUS  FISTULA Right 01/26/2014   Procedure: LIGATION OF ARTERIOVENOUS  FISTULA;  Surgeon: Mal Misty, MD;  Location: [place];  Service: Vascular;  Laterality: Right;  . 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 LOOP RECORDER IMPLANT N/A 07/21/2013   Procedure: LOOP RECORDER IMPLANT;  Surgeon: Evans Lance, MD;  Location: [facility];  Service: Cardiovascular;  Laterality: N/A;  . POLYPECTOMY  07/08/2018   Procedure: POLYPECTOMY;  Surgeon: Rogene Houston, MD;  [facility];  Service: Endoscopy;;  Descending colon polyps x 2   . TEE WITHOUT CARDIOVERSION N/A 07/21/2013   Procedure: TRANSESOPHAGEAL ECHOCARDIOGRAM (TEE);  Surgeon: Dorothy Spark, MD;  Location: [facility] ENDOSCOPY;  Service: Cardiovascular;  Laterality: N/A;   Social History   Occupational History  . Not on file  Tobacco Use  . Smoking status: Never Smoker  . Smokeless tobacco: Never Used  Vaping Use  . Vaping Use:  Never used  Substance and Sexual Activity  . Alcohol use: No  . Drug use: No  . Sexual activity: Not on file

## 2020-09-26 DIAGNOSIS — D509 Iron deficiency anemia, unspecified: Secondary | ICD-10-CM | POA: Diagnosis not present

## 2020-09-26 DIAGNOSIS — E119 Type 2 diabetes mellitus without complications: Secondary | ICD-10-CM | POA: Diagnosis not present

## 2020-09-26 DIAGNOSIS — N186 End stage renal disease: Secondary | ICD-10-CM | POA: Diagnosis not present

## 2020-09-26 DIAGNOSIS — Z992 Dependence on renal dialysis: Secondary | ICD-10-CM | POA: Diagnosis not present

## 2020-09-26 DIAGNOSIS — N2581 Secondary hyperparathyroidism of renal origin: Secondary | ICD-10-CM | POA: Diagnosis not present

## 2020-09-28 DIAGNOSIS — N186 End stage renal disease: Secondary | ICD-10-CM | POA: Diagnosis not present

## 2020-09-28 DIAGNOSIS — Z992 Dependence on renal dialysis: Secondary | ICD-10-CM | POA: Diagnosis not present

## 2020-10-01 DIAGNOSIS — Z992 Dependence on renal dialysis: Secondary | ICD-10-CM | POA: Diagnosis not present

## 2020-10-01 DIAGNOSIS — N186 End stage renal disease: Secondary | ICD-10-CM | POA: Diagnosis not present

## 2020-10-02 DIAGNOSIS — I10 Essential (primary) hypertension: Secondary | ICD-10-CM | POA: Diagnosis not present

## 2020-10-02 DIAGNOSIS — N186 End stage renal disease: Secondary | ICD-10-CM | POA: Diagnosis not present

## 2020-10-02 DIAGNOSIS — E1165 Type 2 diabetes mellitus with hyperglycemia: Secondary | ICD-10-CM | POA: Diagnosis not present

## 2020-10-02 DIAGNOSIS — M5489 Other dorsalgia: Secondary | ICD-10-CM | POA: Diagnosis not present

## 2020-10-03 DIAGNOSIS — Z992 Dependence on renal dialysis: Secondary | ICD-10-CM | POA: Diagnosis not present

## 2020-10-03 DIAGNOSIS — N186 End stage renal disease: Secondary | ICD-10-CM | POA: Diagnosis not present

## 2020-10-04 ENCOUNTER — Ambulatory Visit: Payer: Medicare Other | Admitting: Orthopedic Surgery

## 2020-10-05 DIAGNOSIS — Z992 Dependence on renal dialysis: Secondary | ICD-10-CM | POA: Diagnosis not present

## 2020-10-05 DIAGNOSIS — N186 End stage renal disease: Secondary | ICD-10-CM | POA: Diagnosis not present

## 2020-10-08 DIAGNOSIS — N186 End stage renal disease: Secondary | ICD-10-CM | POA: Diagnosis not present

## 2020-10-08 DIAGNOSIS — Z992 Dependence on renal dialysis: Secondary | ICD-10-CM | POA: Diagnosis not present

## 2020-10-10 DIAGNOSIS — Z992 Dependence on renal dialysis: Secondary | ICD-10-CM | POA: Diagnosis not present

## 2020-10-10 DIAGNOSIS — N186 End stage renal disease: Secondary | ICD-10-CM | POA: Diagnosis not present

## 2020-10-12 DIAGNOSIS — N186 End stage renal disease: Secondary | ICD-10-CM | POA: Diagnosis not present

## 2020-10-12 DIAGNOSIS — Z992 Dependence on renal dialysis: Secondary | ICD-10-CM | POA: Diagnosis not present

## 2020-10-13 DIAGNOSIS — N186 End stage renal disease: Secondary | ICD-10-CM | POA: Diagnosis not present

## 2020-10-13 DIAGNOSIS — E1122 Type 2 diabetes mellitus with diabetic chronic kidney disease: Secondary | ICD-10-CM | POA: Diagnosis not present

## 2020-10-13 DIAGNOSIS — Z992 Dependence on renal dialysis: Secondary | ICD-10-CM | POA: Diagnosis not present

## 2020-10-13 DIAGNOSIS — I120 Hypertensive chronic kidney disease with stage 5 chronic kidney disease or end stage renal disease: Secondary | ICD-10-CM | POA: Diagnosis not present

## 2020-10-15 DIAGNOSIS — N2581 Secondary hyperparathyroidism of renal origin: Secondary | ICD-10-CM | POA: Diagnosis not present

## 2020-10-15 DIAGNOSIS — N186 End stage renal disease: Secondary | ICD-10-CM | POA: Diagnosis not present

## 2020-10-15 DIAGNOSIS — Z992 Dependence on renal dialysis: Secondary | ICD-10-CM | POA: Diagnosis not present

## 2020-10-17 DIAGNOSIS — N186 End stage renal disease: Secondary | ICD-10-CM | POA: Diagnosis not present

## 2020-10-17 DIAGNOSIS — Z992 Dependence on renal dialysis: Secondary | ICD-10-CM | POA: Diagnosis not present

## 2020-10-19 DIAGNOSIS — N186 End stage renal disease: Secondary | ICD-10-CM | POA: Diagnosis not present

## 2020-10-19 DIAGNOSIS — Z992 Dependence on renal dialysis: Secondary | ICD-10-CM | POA: Diagnosis not present

## 2020-10-22 DIAGNOSIS — N186 End stage renal disease: Secondary | ICD-10-CM | POA: Diagnosis not present

## 2020-10-22 DIAGNOSIS — N2581 Secondary hyperparathyroidism of renal origin: Secondary | ICD-10-CM | POA: Diagnosis not present

## 2020-10-22 DIAGNOSIS — D509 Iron deficiency anemia, unspecified: Secondary | ICD-10-CM | POA: Diagnosis not present

## 2020-10-22 DIAGNOSIS — Z992 Dependence on renal dialysis: Secondary | ICD-10-CM | POA: Diagnosis not present

## 2020-10-23 DIAGNOSIS — Z794 Long term (current) use of insulin: Secondary | ICD-10-CM | POA: Diagnosis not present

## 2020-10-23 DIAGNOSIS — M549 Dorsalgia, unspecified: Secondary | ICD-10-CM | POA: Diagnosis not present

## 2020-10-23 DIAGNOSIS — I129 Hypertensive chronic kidney disease with stage 1 through stage 4 chronic kidney disease, or unspecified chronic kidney disease: Secondary | ICD-10-CM | POA: Diagnosis not present

## 2020-10-23 DIAGNOSIS — D510 Vitamin B12 deficiency anemia due to intrinsic factor deficiency: Secondary | ICD-10-CM | POA: Diagnosis not present

## 2020-10-23 DIAGNOSIS — Z992 Dependence on renal dialysis: Secondary | ICD-10-CM | POA: Diagnosis not present

## 2020-10-23 DIAGNOSIS — N186 End stage renal disease: Secondary | ICD-10-CM | POA: Diagnosis not present

## 2020-10-23 DIAGNOSIS — E1122 Type 2 diabetes mellitus with diabetic chronic kidney disease: Secondary | ICD-10-CM | POA: Diagnosis not present

## 2020-10-23 DIAGNOSIS — R059 Cough, unspecified: Secondary | ICD-10-CM | POA: Diagnosis not present

## 2020-10-25 ENCOUNTER — Emergency Department (HOSPITAL_COMMUNITY): Payer: Medicare Other

## 2020-10-25 ENCOUNTER — Other Ambulatory Visit: Payer: Self-pay

## 2020-10-25 ENCOUNTER — Inpatient Hospital Stay (HOSPITAL_COMMUNITY)
Admission: EM | Admit: 2020-10-25 | Discharge: 2020-11-08 | DRG: 177 | Disposition: A | Discharge status: Skilled Nursing Facility | Payer: Medicare Other | Attending: Student | Admitting: Student

## 2020-10-25 DIAGNOSIS — E8770 Fluid overload, unspecified: Secondary | ICD-10-CM | POA: Diagnosis present

## 2020-10-25 DIAGNOSIS — J323 Chronic sphenoidal sinusitis: Secondary | ICD-10-CM | POA: Diagnosis not present

## 2020-10-25 DIAGNOSIS — Z7982 Long term (current) use of aspirin: Secondary | ICD-10-CM | POA: Diagnosis not present

## 2020-10-25 DIAGNOSIS — J189 Pneumonia, unspecified organism: Secondary | ICD-10-CM | POA: Diagnosis not present

## 2020-10-25 DIAGNOSIS — I120 Hypertensive chronic kidney disease with stage 5 chronic kidney disease or end stage renal disease: Secondary | ICD-10-CM | POA: Diagnosis not present

## 2020-10-25 DIAGNOSIS — R0600 Dyspnea, unspecified: Secondary | ICD-10-CM | POA: Diagnosis not present

## 2020-10-25 DIAGNOSIS — R112 Nausea with vomiting, unspecified: Secondary | ICD-10-CM | POA: Diagnosis not present

## 2020-10-25 DIAGNOSIS — E1122 Type 2 diabetes mellitus with diabetic chronic kidney disease: Secondary | ICD-10-CM | POA: Diagnosis present

## 2020-10-25 DIAGNOSIS — G9341 Metabolic encephalopathy: Secondary | ICD-10-CM | POA: Diagnosis not present

## 2020-10-25 DIAGNOSIS — R739 Hyperglycemia, unspecified: Secondary | ICD-10-CM

## 2020-10-25 DIAGNOSIS — E8779 Other fluid overload: Secondary | ICD-10-CM | POA: Diagnosis not present

## 2020-10-25 DIAGNOSIS — I161 Hypertensive emergency: Secondary | ICD-10-CM | POA: Diagnosis present

## 2020-10-25 DIAGNOSIS — D6959 Other secondary thrombocytopenia: Secondary | ICD-10-CM | POA: Diagnosis present

## 2020-10-25 DIAGNOSIS — Z20822 Contact with and (suspected) exposure to covid-19: Secondary | ICD-10-CM | POA: Diagnosis present

## 2020-10-25 DIAGNOSIS — Z79899 Other long term (current) drug therapy: Secondary | ICD-10-CM | POA: Diagnosis not present

## 2020-10-25 DIAGNOSIS — I10 Essential (primary) hypertension: Secondary | ICD-10-CM | POA: Diagnosis present

## 2020-10-25 DIAGNOSIS — D631 Anemia in chronic kidney disease: Secondary | ICD-10-CM | POA: Diagnosis present

## 2020-10-25 DIAGNOSIS — Z8673 Personal history of transient ischemic attack (TIA), and cerebral infarction without residual deficits: Secondary | ICD-10-CM

## 2020-10-25 DIAGNOSIS — I69351 Hemiplegia and hemiparesis following cerebral infarction affecting right dominant side: Secondary | ICD-10-CM | POA: Diagnosis not present

## 2020-10-25 DIAGNOSIS — R778 Other specified abnormalities of plasma proteins: Secondary | ICD-10-CM

## 2020-10-25 DIAGNOSIS — J9601 Acute respiratory failure with hypoxia: Secondary | ICD-10-CM | POA: Diagnosis present

## 2020-10-25 DIAGNOSIS — R0789 Other chest pain: Secondary | ICD-10-CM | POA: Diagnosis not present

## 2020-10-25 DIAGNOSIS — Z9981 Dependence on supplemental oxygen: Secondary | ICD-10-CM | POA: Diagnosis not present

## 2020-10-25 DIAGNOSIS — N185 Chronic kidney disease, stage 5: Secondary | ICD-10-CM | POA: Diagnosis not present

## 2020-10-25 DIAGNOSIS — J690 Pneumonitis due to inhalation of food and vomit: Principal | ICD-10-CM | POA: Diagnosis present

## 2020-10-25 DIAGNOSIS — R000 Tachycardia, unspecified: Secondary | ICD-10-CM | POA: Diagnosis not present

## 2020-10-25 DIAGNOSIS — I160 Hypertensive urgency: Secondary | ICD-10-CM | POA: Diagnosis not present

## 2020-10-25 DIAGNOSIS — I959 Hypotension, unspecified: Secondary | ICD-10-CM | POA: Diagnosis not present

## 2020-10-25 DIAGNOSIS — E1140 Type 2 diabetes mellitus with diabetic neuropathy, unspecified: Secondary | ICD-10-CM | POA: Diagnosis present

## 2020-10-25 DIAGNOSIS — M546 Pain in thoracic spine: Secondary | ICD-10-CM

## 2020-10-25 DIAGNOSIS — G2581 Restless legs syndrome: Secondary | ICD-10-CM | POA: Diagnosis present

## 2020-10-25 DIAGNOSIS — E1165 Type 2 diabetes mellitus with hyperglycemia: Secondary | ICD-10-CM | POA: Diagnosis not present

## 2020-10-25 DIAGNOSIS — E118 Type 2 diabetes mellitus with unspecified complications: Secondary | ICD-10-CM | POA: Diagnosis not present

## 2020-10-25 DIAGNOSIS — R5381 Other malaise: Secondary | ICD-10-CM | POA: Diagnosis present

## 2020-10-25 DIAGNOSIS — E871 Hypo-osmolality and hyponatremia: Secondary | ICD-10-CM | POA: Diagnosis present

## 2020-10-25 DIAGNOSIS — R262 Difficulty in walking, not elsewhere classified: Secondary | ICD-10-CM | POA: Diagnosis not present

## 2020-10-25 DIAGNOSIS — E785 Hyperlipidemia, unspecified: Secondary | ICD-10-CM | POA: Diagnosis present

## 2020-10-25 DIAGNOSIS — E1169 Type 2 diabetes mellitus with other specified complication: Secondary | ICD-10-CM | POA: Diagnosis not present

## 2020-10-25 DIAGNOSIS — E1159 Type 2 diabetes mellitus with other circulatory complications: Secondary | ICD-10-CM | POA: Diagnosis not present

## 2020-10-25 DIAGNOSIS — R531 Weakness: Secondary | ICD-10-CM | POA: Diagnosis not present

## 2020-10-25 DIAGNOSIS — I152 Hypertension secondary to endocrine disorders: Secondary | ICD-10-CM | POA: Diagnosis not present

## 2020-10-25 DIAGNOSIS — G9389 Other specified disorders of brain: Secondary | ICD-10-CM | POA: Diagnosis not present

## 2020-10-25 DIAGNOSIS — E875 Hyperkalemia: Secondary | ICD-10-CM | POA: Diagnosis present

## 2020-10-25 DIAGNOSIS — R079 Chest pain, unspecified: Secondary | ICD-10-CM | POA: Diagnosis not present

## 2020-10-25 DIAGNOSIS — Z7401 Bed confinement status: Secondary | ICD-10-CM | POA: Diagnosis not present

## 2020-10-25 DIAGNOSIS — G238 Other specified degenerative diseases of basal ganglia: Secondary | ICD-10-CM | POA: Diagnosis not present

## 2020-10-25 DIAGNOSIS — K219 Gastro-esophageal reflux disease without esophagitis: Secondary | ICD-10-CM | POA: Diagnosis present

## 2020-10-25 DIAGNOSIS — Z743 Need for continuous supervision: Secondary | ICD-10-CM | POA: Diagnosis not present

## 2020-10-25 DIAGNOSIS — I878 Other specified disorders of veins: Secondary | ICD-10-CM | POA: Diagnosis not present

## 2020-10-25 DIAGNOSIS — M4186 Other forms of scoliosis, lumbar region: Secondary | ICD-10-CM | POA: Diagnosis not present

## 2020-10-25 DIAGNOSIS — D72825 Bandemia: Secondary | ICD-10-CM | POA: Diagnosis not present

## 2020-10-25 DIAGNOSIS — N186 End stage renal disease: Secondary | ICD-10-CM | POA: Diagnosis present

## 2020-10-25 DIAGNOSIS — E1129 Type 2 diabetes mellitus with other diabetic kidney complication: Secondary | ICD-10-CM | POA: Diagnosis not present

## 2020-10-25 DIAGNOSIS — D696 Thrombocytopenia, unspecified: Secondary | ICD-10-CM | POA: Diagnosis not present

## 2020-10-25 DIAGNOSIS — M6281 Muscle weakness (generalized): Secondary | ICD-10-CM | POA: Diagnosis not present

## 2020-10-25 DIAGNOSIS — Z794 Long term (current) use of insulin: Secondary | ICD-10-CM

## 2020-10-25 DIAGNOSIS — I517 Cardiomegaly: Secondary | ICD-10-CM | POA: Diagnosis not present

## 2020-10-25 DIAGNOSIS — R0602 Shortness of breath: Secondary | ICD-10-CM | POA: Diagnosis not present

## 2020-10-25 DIAGNOSIS — Z9115 Patient's noncompliance with renal dialysis: Secondary | ICD-10-CM | POA: Diagnosis not present

## 2020-10-25 DIAGNOSIS — I6389 Other cerebral infarction: Secondary | ICD-10-CM | POA: Diagnosis not present

## 2020-10-25 DIAGNOSIS — K5900 Constipation, unspecified: Secondary | ICD-10-CM | POA: Diagnosis present

## 2020-10-25 DIAGNOSIS — N2581 Secondary hyperparathyroidism of renal origin: Secondary | ICD-10-CM | POA: Diagnosis present

## 2020-10-25 DIAGNOSIS — Z992 Dependence on renal dialysis: Secondary | ICD-10-CM

## 2020-10-25 DIAGNOSIS — R2681 Unsteadiness on feet: Secondary | ICD-10-CM | POA: Diagnosis not present

## 2020-10-25 LAB — CBG MONITORING, ED: Glucose-Capillary: 507 mg/dL (ref 70–99)

## 2020-10-25 LAB — CBC WITH DIFFERENTIAL/PLATELET
Abs Immature Granulocytes: 0.15 10*3/uL — ABNORMAL HIGH (ref 0.00–0.07)
Basophils Absolute: 0 10*3/uL (ref 0.0–0.1)
Basophils Relative: 0 %
Eosinophils Absolute: 0 10*3/uL (ref 0.0–0.5)
Eosinophils Relative: 0 %
HCT: 34.6 % — ABNORMAL LOW (ref 36.0–46.0)
Hemoglobin: 11.8 g/dL — ABNORMAL LOW (ref 12.0–15.0)
Immature Granulocytes: 1 %
Lymphocytes Relative: 4 %
Lymphs Abs: 0.7 10*3/uL (ref 0.7–4.0)
MCH: 31.7 pg (ref 26.0–34.0)
MCHC: 34.1 g/dL (ref 30.0–36.0)
MCV: 93 fL (ref 80.0–100.0)
Monocytes Absolute: 1.3 10*3/uL — ABNORMAL HIGH (ref 0.1–1.0)
Monocytes Relative: 7 %
Neutro Abs: 15.9 10*3/uL — ABNORMAL HIGH (ref 1.7–7.7)
Neutrophils Relative %: 88 %
Platelets: 130 10*3/uL — ABNORMAL LOW (ref 150–400)
RBC: 3.72 MIL/uL — ABNORMAL LOW (ref 3.87–5.11)
RDW: 14.6 % (ref 11.5–15.5)
WBC: 18.1 10*3/uL — ABNORMAL HIGH (ref 4.0–10.5)
nRBC: 0 % (ref 0.0–0.2)

## 2020-10-25 LAB — BASIC METABOLIC PANEL
Anion gap: 17 — ABNORMAL HIGH (ref 5–15)
BUN: 82 mg/dL — ABNORMAL HIGH (ref 8–23)
CO2: 22 mmol/L (ref 22–32)
Calcium: 7.7 mg/dL — ABNORMAL LOW (ref 8.9–10.3)
Chloride: 88 mmol/L — ABNORMAL LOW (ref 98–111)
Creatinine, Ser: 10.55 mg/dL — ABNORMAL HIGH (ref 0.44–1.00)
GFR, Estimated: 3 mL/min — ABNORMAL LOW (ref >60)
Glucose, Bld: 610 mg/dL (ref 70–99)
Potassium: 6.2 mmol/L — ABNORMAL HIGH (ref 3.5–5.1)
Sodium: 127 mmol/L — ABNORMAL LOW (ref 135–145)

## 2020-10-25 LAB — TROPONIN I (HIGH SENSITIVITY)
Troponin I (High Sensitivity): 34 ng/L — ABNORMAL HIGH (ref <18)
Troponin I (High Sensitivity): 36 ng/L — ABNORMAL HIGH (ref <18)

## 2020-10-25 LAB — RESP PANEL BY RT-PCR (FLU A&B, COVID) ARPGX2
Influenza A by PCR: NEGATIVE
Influenza B by PCR: NEGATIVE
SARS Coronavirus 2 by RT PCR: NEGATIVE

## 2020-10-25 MED ORDER — INSULIN ASPART 100 UNIT/ML IJ SOLN
10.0000 [IU] | Freq: Once | INTRAMUSCULAR | Status: AC
  Administered 2020-10-25: 10 [IU] via INTRAVENOUS

## 2020-10-25 MED ORDER — LABETALOL HCL 5 MG/ML IV SOLN
20.0000 mg | Freq: Once | INTRAVENOUS | Status: AC
  Administered 2020-10-25: 20 mg via INTRAVENOUS
  Filled 2020-10-25: qty 4

## 2020-10-25 MED ORDER — ALBUTEROL SULFATE HFA 108 (90 BASE) MCG/ACT IN AERS
4.0000 | INHALATION_SPRAY | Freq: Once | RESPIRATORY_TRACT | Status: AC
  Administered 2020-10-25: 4 via RESPIRATORY_TRACT
  Filled 2020-10-25: qty 6.7

## 2020-10-25 MED ORDER — CHLORHEXIDINE GLUCONATE CLOTH 2 % EX PADS
6.0000 | MEDICATED_PAD | Freq: Every day | CUTANEOUS | Status: DC
  Administered 2020-10-27: 6 via TOPICAL

## 2020-10-25 MED ORDER — SODIUM ZIRCONIUM CYCLOSILICATE 10 G PO PACK
10.0000 g | PACK | Freq: Once | ORAL | Status: AC
  Administered 2020-10-26: 10 g via ORAL
  Filled 2020-10-25: qty 1

## 2020-10-25 MED ORDER — FENTANYL CITRATE (PF) 100 MCG/2ML IJ SOLN
50.0000 ug | Freq: Once | INTRAMUSCULAR | Status: AC
  Administered 2020-10-25: 50 ug via INTRAVENOUS
  Filled 2020-10-25: qty 2

## 2020-10-25 NOTE — ED Triage Notes (Signed)
Pt c/o increasing shortness of breath and right chest pain radiating into back for past week and a half. Pt RR=44 in triage. States she feels very short of breath and tightness, has had a cough.  Pt is a dialysis pt, MWF, missed yesterday d/t feeling bad.

## 2020-10-25 NOTE — Progress Notes (Signed)
Informed of patient in ER. To be admitted for evaluation of elevated troponin, back pain, hyperglycemia. Labs reviewed. Blood glucose 610. Na 127, corrected na ~135. Agree with lokelma but ultimately controlling her sugars will improve her K. Apparently missed HD on Wed. Plan for HD tomorrow, full consult to follow in AM. HD orders placed Will need outpatient HD orders ([place] ).  Gean Quint, MD [facility]

## 2020-10-25 NOTE — ED Notes (Signed)
EDP notified of CBG  

## 2020-10-25 NOTE — ED Provider Notes (Signed)
Emergency Medicine Provider Triage Evaluation Note  [name] , a 80 y.o. female  was evaluated in triage.  Pt complains of sob and right sided chest pain radiating to the back for 1.5 weeks.  She missed dialysis this week because of the right sided chest pain.  Review of Systems  Positive: Chest pain, sob, cough Negative: vomiting  Physical Exam  There were no vitals taken for this visit. Gen:   Awake, no distress Resp:  Normal effort  MSK:   Moves extremities without difficulty  Other:  Coarse breath sounds, tachypnea  Medical Decision Making  Medically screening exam initiated at 7:17 PM.  Appropriate orders placed.  [name] was informed that the remainder of the evaluation will be completed by another provider, this initial triage assessment does not replace that evaluation, and the importance of remaining in the ED until their evaluation is complete.  Nursing staff advised to prioritize pt to be seen next.    [name] Dublin 10/25/20 Alice, Ankit, MD 10/26/20 [phone]

## 2020-10-25 NOTE — ED Provider Notes (Signed)
[facility] EMERGENCY DEPARTMENT Provider Note   CSN: [id number] Arrival date & time: 10/25/20  1856     History Chief Complaint  Patient presents with  . Shortness of Breath    [name] is a 80 y.o. female.  HPI   Patient presents to the ED for evaluation of right-sided chest pain as well as shortness of breath that started 1-1/2 weeks ago.  Patient states she has had pain on the posterior chest.  She has also been coughing a lot.  She is feeling more short of breath.  Patient denies any fevers or chills.  No vomiting or diarrhea.  Patient goes to dialysis Monday Wednesday Friday and states she did go yesterday although nursing notes indicate she missed yesterday.  Past Medical History:  Diagnosis Date  . Anemia   . Cataract   . Chronic kidney disease   . CVA (cerebral infarction)   . Diabetes mellitus    Type 2  . Diabetes mellitus without complication ([place])   . Dialysis patient ([place])   . Dialysis patient ([place])    M, W, F  . Fistula    R arm  . GERD (gastroesophageal reflux disease)   . Hypertension   . Renal disorder   . Shortness of breath   . Stroke [facility])    right side weakness    Patient Active Problem List   Diagnosis Date Noted  . Gangrene of toe of right foot ([place])   . Cellulitis of right foot 01/05/2020  . History of CVA (cerebrovascular accident) 01/05/2020  . Thrombocytopenia ([place]) 01/05/2020  . Diabetic ulcer of right foot associated with diabetes mellitus due to underlying condition ([place]) 01/05/2020  . History of colonic polyps 04/01/2018  . Acute bronchitis 10/15/2017  . DM type 2, goal A1c below 7   . HCAP (healthcare-associated pneumonia) 05/29/2015  . Hyperkalemia 05/29/2015  . Diabetes mellitus with complication ([place])   . ESRD (end stage renal disease) on dialysis ([place])   . Encounter for adequacy testing for hemodialysis ([place]) 01/17/2014  . Mechanical complication of other vascular device, implant, and graft  01/17/2014  . Disturbances of vision, late effect of stroke 11/03/2013  . Right hemiparesis ([place]) 08/19/2013  . CVA (cerebral infarction) 07/20/2013  . Gastroparesis due to DM ([place]) 07/01/2013  . End stage renal disease ([place]) 06/25/2013  . Anemia 01/26/2013  . DM W/NEURO MNFST, TYPE II, UNCONTROLLED 11/19/2006  . Leukocytosis 11/19/2006  . DEPENDENT EDEMA, LEGS, BILATERAL 11/19/2006  . SINUS TACHYCARDIA 10/16/2006  . Diabetes mellitus, type 2 ([place]) 05/20/2006  . HLD (hyperlipidemia) 05/20/2006  . SYNDROME, RESTLESS LEGS 05/20/2006  . Hereditary and idiopathic peripheral neuropathy 05/20/2006  . Hypertension associated with diabetes ([place]) 05/20/2006    Past Surgical History:  Procedure Laterality Date  . AMPUTATION Right 01/13/2020   Procedure: AMPUTATION RIGHT FOOT 1ST RAY;  Surgeon: Newt Minion, MD;  Location: [facility];  Service: Orthopedics;  Laterality: Right;  . AV FISTULA PLACEMENT Right 09/08/2013   Procedure: CREATION OF RIGHT BRACHIAL CEPHALIC ARTERIOVENOUS FISTULA ;  Surgeon: Mal Misty, MD;  Location: [facility];  Service: Vascular;  Laterality: Right;  . BASCILIC VEIN TRANSPOSITION Right 01/26/2014   Procedure: Right Arm BASILIC VEIN TRANSPOSITION;  Surgeon: Mal Misty, MD;  Location: [facility];  Service: Vascular;  Laterality: Right;  . CATARACT EXTRACTION W/PHACO  11/20/2011   Procedure: CATARACT EXTRACTION PHACO AND INTRAOCULAR LENS PLACEMENT (IOC);  Surgeon: Tonny Branch, MD;  Location: [facility];  Service: Ophthalmology;  Laterality: Right;  CDE 18.82  . CATARACT EXTRACTION W/PHACO Left 11/18/2012   Procedure: CATARACT EXTRACTION PHACO AND INTRAOCULAR LENS PLACEMENT (IOC);  Surgeon: Tonny Branch, MD;  Location: [facility];  Service: Ophthalmology;  Laterality: Left;  CDE: 18.97  . COLONOSCOPY N/A 02/09/2013   Procedure: COLONOSCOPY;  Surgeon: Rogene Houston, MD;  [facility];  Service: Endoscopy;  Laterality: N/A;  305-moved to 220 Ann to notify pt  . COLONOSCOPY N/A  07/08/2018   Procedure: COLONOSCOPY;  Surgeon: Rogene Houston, MD;  [facility];  Service: Endoscopy;  Laterality: N/A;  930  . INSERTION OF DIALYSIS CATHETER Right 06/24/2013   Procedure: INSERTION OF DIALYSIS CATHETER: Ultrasound guided;  Surgeon: Serafina Mitchell, MD;  Location: [facility];  Service: Vascular;  Laterality: Right;  . LIGATION OF ARTERIOVENOUS  FISTULA Right 01/26/2014   Procedure: LIGATION OF ARTERIOVENOUS  FISTULA;  Surgeon: Mal Misty, MD;  Location: [facility];  Service: Vascular;  Laterality: Right;  . 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 LOOP RECORDER IMPLANT N/A 07/21/2013   Procedure: LOOP RECORDER IMPLANT;  Surgeon: Evans Lance, MD;  Location: [facility] CATH LAB;  Service: Cardiovascular;  Laterality: N/A;  . POLYPECTOMY  07/08/2018   Procedure: POLYPECTOMY;  Surgeon: Rogene Houston, MD;  [facility];  Service: Endoscopy;;  Descending colon polyps x 2   . TEE WITHOUT CARDIOVERSION N/A 07/21/2013   Procedure: TRANSESOPHAGEAL ECHOCARDIOGRAM (TEE);  Surgeon: Dorothy Spark, MD;  Location: [facility] ENDOSCOPY;  Service: Cardiovascular;  Laterality: N/A;     OB History    Gravida  0   Para  0   Term  0   Preterm  0   AB  0   Living        SAB  0   IAB  0   Ectopic  0   Multiple      Live Births              Family History  Problem Relation Age of Onset  . Cancer Sister   . Cancer Brother   . Anesthesia problems Neg Hx   . Hypotension Neg Hx   . Malignant hyperthermia Neg Hx   . Pseudochol deficiency Neg Hx     Social History   Tobacco Use  . Smoking status: Never Smoker  . Smokeless tobacco: Never Used  Vaping Use  . Vaping Use: Never used  Substance Use Topics  . Alcohol use: No  . Drug use: No    Home Medications Prior to Admission medications   Medication Sig Start Date End Date Taking? Authorizing Provider  albuterol (PROVENTIL HFA;VENTOLIN HFA) 108 (90 Base)  MCG/ACT inhaler Inhale 2 puffs into the lungs every 6 (six) hours as needed for wheezing or shortness of breath. 10/16/17   Kathie Dike, MD  aspirin 81 MG chewable tablet Chew 2 tablets (162 mg total) by mouth daily. 07/09/18   Rogene Houston, MD  BD INSULIN SYRINGE U/F 31G X 5/16" 0.3 ML MISC 2 (two) times daily. as directed 09/15/18   [provider]  benzonatate (TESSALON) 100 MG capsule Take 1 capsule (100 mg total) by mouth every 8 (eight) hours. 06/28/20   Faustino Congress, NP  cyanocobalamin (,VITAMIN B-12,) 1000 MCG/ML injection Inject 1 mL into the muscle every 30 (thirty) days. 11/01/14   [provider]  cyclobenzaprine (FLEXERIL) 10 MG tablet  Take 0.5 tablets (5 mg total) by mouth 3 (three) times daily as needed for muscle spasms. 09/25/20   Persons, Bevely Palmer, PA  gabapentin (NEURONTIN) 100 MG capsule Take 1 capsule (100 mg total) by mouth 3 (three) times daily. 04/12/20   Avegno, Darrelyn Hillock, FNP  insulin NPH Human (NOVOLIN N) 100 UNIT/ML injection Inject 0.07 mLs (7 Units total) into the skin 2 (two) times daily before a meal. 01/17/20   Donne Hazel, MD  labetalol (NORMODYNE) 100 MG tablet Take 1 tablet (100 mg total) by mouth 2 (two) times daily. 01/17/20 02/16/20  Donne Hazel, MD  labetalol (NORMODYNE) 200 MG tablet Take 200 mg by mouth 2 (two) times daily. 03/22/20   [provider]  lidocaine-prilocaine (EMLA) cream Apply 1 application topically every Monday, Wednesday, and Friday.  07/01/15   [provider]  multivitamin (RENA-VIT) TABS tablet Take 1 tablet by mouth at bedtime. 08/21/14   Kirsteins, Luanna Salk, MD  nitroGLYCERIN (NITRODUR - DOSED IN MG/24 HR) 0.2 mg/hr patch Place 1 patch (0.2 mg total) onto the skin daily. (For Foot) 04/24/20   Newt Minion, MD  omeprazole (PRILOSEC) 20 MG capsule Take 1 capsule (20 mg total) by mouth daily. 03/24/20   Maudie Flakes, MD  oxyCODONE (OXY IR/ROXICODONE) 5 MG immediate release tablet Take 1 tablet (5  mg total) by mouth every 8 (eight) hours as needed for severe pain. 03/15/20   Persons, Bevely Palmer, PA  pentoxifylline (TRENTAL) 400 MG CR tablet Take 1 tablet (400 mg total) by mouth daily with breakfast. 02/22/20   Persons, Bevely Palmer, PA  sevelamer carbonate (RENVELA) 800 MG tablet Take 800 mg by mouth 3 (three) times daily with meals.  12/24/17   [provider]  silver sulfADIAZINE (SILVADENE) 1 % cream Apply 1 application topically daily. 03/14/20   Suzan Slick, NP  sucralfate (CARAFATE) 1 g tablet Take 1 tablet (1 g total) by mouth 4 (four) times daily as needed. 03/24/20   Maudie Flakes, MD    Allergies    Ambien [zolpidem] and Reglan [metoclopramide]  Review of Systems   Review of Systems  All other systems reviewed and are negative.   Physical Exam Updated Vital Signs BP (!) 181/80   Pulse 88   Temp 98.6 F (37 C) (Oral)   Resp (!) 39   Ht 1.702 m (5\' 7" )   Wt 83 kg   SpO2 96%   BMI 28.66 kg/m   Physical Exam Vitals and nursing note reviewed.  Constitutional:      General: She is not in acute distress.    Appearance: She is well-developed.  HENT:     Head: Normocephalic and atraumatic.     Right Ear: External ear normal.     Left Ear: External ear normal.  Eyes:     General: No scleral icterus.       Right eye: No discharge.        Left eye: No discharge.     Conjunctiva/sclera: Conjunctivae normal.  Neck:     Trachea: No tracheal deviation.  Cardiovascular:     Rate and Rhythm: Normal rate and regular rhythm.  Pulmonary:     Effort: Pulmonary effort is normal. Tachypnea present. No respiratory distress.     Breath sounds: No stridor. Wheezing and rales present.  Abdominal:     General: Bowel sounds are normal. There is no distension.     Palpations: Abdomen is soft.  Tenderness: There is no abdominal tenderness. There is no guarding or rebound.  Musculoskeletal:        General: No tenderness.     Cervical back: Neck supple.  Skin:     General: Skin is warm and dry.     Findings: No rash.  Neurological:     Mental Status: She is alert.     Cranial Nerves: No cranial nerve deficit (no facial droop, extraocular movements intact, no slurred speech).     Sensory: No sensory deficit.     Motor: No abnormal muscle tone or seizure activity.     Coordination: Coordination normal.     ED Results / Procedures / Treatments   Labs (all labs ordered are listed, but only abnormal results are displayed) Labs Reviewed  CBC WITH DIFFERENTIAL/PLATELET - Abnormal; Notable for the following components:      Result Value   WBC 18.1 (*)    RBC 3.72 (*)    Hemoglobin 11.8 (*)    HCT 34.6 (*)    Platelets 130 (*)    Neutro Abs 15.9 (*)    Monocytes Absolute 1.3 (*)    Abs Immature Granulocytes 0.15 (*)    All other components within normal limits  BASIC METABOLIC PANEL - Abnormal; Notable for the following components:   Sodium 127 (*)    Potassium 6.2 (*)    Chloride 88 (*)    Glucose, Bld 610 (*)    BUN 82 (*)    Creatinine, Ser 10.55 (*)    Calcium 7.7 (*)    GFR, Estimated 3 (*)    Anion gap 17 (*)    All other components within normal limits  TROPONIN I (HIGH SENSITIVITY) - Abnormal; Notable for the following components:   Troponin I (High Sensitivity) 34 (*)    All other components within normal limits  RESP PANEL BY RT-PCR (FLU A&B, COVID) [id number]  TROPONIN I (HIGH SENSITIVITY)    EKG EKG Interpretation  Date/Time:  Thursday Oct 25 2020 19:30:00 EDT Ventricular Rate:  106 PR Interval:  141 QRS Duration: 88 QT Interval:  361 QTC Calculation: 480 R Axis:   -13 Text Interpretation: Sinus tachycardia Probable left atrial enlargement Baseline wander in lead(s) V5 Since last tracing rate faster Confirmed by Dorie Rank [phone]) on 10/25/2020 8:08:39 PM   Radiology DG Chest Portable 1 View  Result Date: 10/25/2020 CLINICAL DATA:  Shortness of breath and increasing right-sided chest pain EXAM: PORTABLE CHEST 1 VIEW  COMPARISON:  09/20/2020 FINDINGS: Cardiac shadow is enlarged. Loop recorder is again seen. Lungs are well aerated without focal infiltrate or sizable effusion. No bony abnormality is seen. Stenting is noted in the right arm. IMPRESSION: No acute abnormality noted. Electronically Signed   By: Inez Catalina M.D.   On: 10/25/2020 19:48    Procedures Procedures   Medications Ordered in ED Medications  insulin aspart (novoLOG) injection 10 Units (has no administration in time range)  fentaNYL (SUBLIMAZE) injection 50 mcg (has no administration in time range)  albuterol (VENTOLIN HFA) 108 (90 Base) MCG/ACT inhaler 4 puff (4 puffs Inhalation Given 10/25/20 2006)  labetalol (NORMODYNE) injection 20 mg (20 mg Intravenous Given 10/25/20 2015)    ED Course  I have reviewed the triage vital signs and the nursing notes.  Pertinent labs & imaging results that were available during my care of the patient were reviewed by me and considered in my medical decision making (see chart for details).  Clinical Course as of 10/25/20 2301  Thu Oct 25, 2020  1935 Blood pressure notably elevated.  Patient states she did take her medications today.  Will recheck her blood pressure. [JK]  2006 Blood pressure remains elevated.  We will order labetalol [JK]  2006 EKG 12-Lead [JK]  2054 Blood pressure has improved with labetalol. [JK]  2054 Chest x-ray without acute abnormality. [JK]  2054 Covid and flu test are negative. [JK]  2152 Patient's blood sugar is significantly elevated at 610 [JK]  2301 Discussed with Dr Candiss Norse.  Nephrology will see pt in am [JK]    Clinical Course User Index [JK] Dorie Rank, MD   MDM Rules/Calculators/A&P                          Patient presented to the ED for evaluation of back pain as well as shortness of breath.  Patient does have history of chronic kidney disease.  She did not go to dialysis on Wednesday although initially she told me she did.  Family members confirm that she did  not go on Wednesday.  Patient has been having issues with back pain and she has been seeing her doctor as an outpatient.  No clear etiology for this at this point.  Patient's troponins are slightly elevated and I do think we will need to monitor this but I have low suspicion for ACS at this time.  Its possible this pain could be musculoskeletal in etiology.  Patient has been given a dose of pain meds.  With her hyperglycemia and slightly elevated troponins I think she warrants admission for further treatment.  She will end up needing dialysis but not emergently. 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

## 2020-10-26 ENCOUNTER — Encounter (HOSPITAL_COMMUNITY): Payer: Self-pay | Admitting: Internal Medicine

## 2020-10-26 DIAGNOSIS — J189 Pneumonia, unspecified organism: Secondary | ICD-10-CM

## 2020-10-26 DIAGNOSIS — I69351 Hemiplegia and hemiparesis following cerebral infarction affecting right dominant side: Secondary | ICD-10-CM | POA: Diagnosis not present

## 2020-10-26 DIAGNOSIS — N186 End stage renal disease: Secondary | ICD-10-CM

## 2020-10-26 DIAGNOSIS — Z794 Long term (current) use of insulin: Secondary | ICD-10-CM | POA: Diagnosis not present

## 2020-10-26 DIAGNOSIS — E8770 Fluid overload, unspecified: Secondary | ICD-10-CM | POA: Diagnosis not present

## 2020-10-26 DIAGNOSIS — R0600 Dyspnea, unspecified: Secondary | ICD-10-CM | POA: Diagnosis not present

## 2020-10-26 DIAGNOSIS — E1159 Type 2 diabetes mellitus with other circulatory complications: Secondary | ICD-10-CM | POA: Diagnosis not present

## 2020-10-26 DIAGNOSIS — E1122 Type 2 diabetes mellitus with diabetic chronic kidney disease: Secondary | ICD-10-CM | POA: Diagnosis present

## 2020-10-26 DIAGNOSIS — R778 Other specified abnormalities of plasma proteins: Secondary | ICD-10-CM

## 2020-10-26 DIAGNOSIS — Z992 Dependence on renal dialysis: Secondary | ICD-10-CM | POA: Diagnosis not present

## 2020-10-26 DIAGNOSIS — Z9115 Patient's noncompliance with renal dialysis: Secondary | ICD-10-CM | POA: Diagnosis not present

## 2020-10-26 DIAGNOSIS — E785 Hyperlipidemia, unspecified: Secondary | ICD-10-CM | POA: Diagnosis present

## 2020-10-26 DIAGNOSIS — E8779 Other fluid overload: Secondary | ICD-10-CM | POA: Diagnosis not present

## 2020-10-26 DIAGNOSIS — J9601 Acute respiratory failure with hypoxia: Secondary | ICD-10-CM | POA: Diagnosis not present

## 2020-10-26 DIAGNOSIS — Z20822 Contact with and (suspected) exposure to covid-19: Secondary | ICD-10-CM | POA: Diagnosis present

## 2020-10-26 DIAGNOSIS — J690 Pneumonitis due to inhalation of food and vomit: Secondary | ICD-10-CM | POA: Diagnosis present

## 2020-10-26 DIAGNOSIS — G238 Other specified degenerative diseases of basal ganglia: Secondary | ICD-10-CM | POA: Diagnosis not present

## 2020-10-26 DIAGNOSIS — M546 Pain in thoracic spine: Secondary | ICD-10-CM | POA: Diagnosis not present

## 2020-10-26 DIAGNOSIS — E875 Hyperkalemia: Secondary | ICD-10-CM | POA: Diagnosis present

## 2020-10-26 DIAGNOSIS — R0602 Shortness of breath: Secondary | ICD-10-CM | POA: Diagnosis present

## 2020-10-26 DIAGNOSIS — E871 Hypo-osmolality and hyponatremia: Secondary | ICD-10-CM | POA: Diagnosis present

## 2020-10-26 DIAGNOSIS — E1169 Type 2 diabetes mellitus with other specified complication: Secondary | ICD-10-CM | POA: Diagnosis present

## 2020-10-26 DIAGNOSIS — I152 Hypertension secondary to endocrine disorders: Secondary | ICD-10-CM | POA: Diagnosis present

## 2020-10-26 DIAGNOSIS — R739 Hyperglycemia, unspecified: Secondary | ICD-10-CM

## 2020-10-26 DIAGNOSIS — E1129 Type 2 diabetes mellitus with other diabetic kidney complication: Secondary | ICD-10-CM | POA: Diagnosis not present

## 2020-10-26 DIAGNOSIS — D72825 Bandemia: Secondary | ICD-10-CM | POA: Diagnosis not present

## 2020-10-26 DIAGNOSIS — Z8673 Personal history of transient ischemic attack (TIA), and cerebral infarction without residual deficits: Secondary | ICD-10-CM

## 2020-10-26 DIAGNOSIS — I517 Cardiomegaly: Secondary | ICD-10-CM | POA: Diagnosis not present

## 2020-10-26 DIAGNOSIS — R5381 Other malaise: Secondary | ICD-10-CM | POA: Diagnosis not present

## 2020-10-26 DIAGNOSIS — I161 Hypertensive emergency: Secondary | ICD-10-CM | POA: Diagnosis present

## 2020-10-26 DIAGNOSIS — J323 Chronic sphenoidal sinusitis: Secondary | ICD-10-CM | POA: Diagnosis not present

## 2020-10-26 DIAGNOSIS — Z7982 Long term (current) use of aspirin: Secondary | ICD-10-CM | POA: Diagnosis not present

## 2020-10-26 DIAGNOSIS — E1165 Type 2 diabetes mellitus with hyperglycemia: Secondary | ICD-10-CM | POA: Diagnosis present

## 2020-10-26 DIAGNOSIS — E118 Type 2 diabetes mellitus with unspecified complications: Secondary | ICD-10-CM

## 2020-10-26 DIAGNOSIS — I6389 Other cerebral infarction: Secondary | ICD-10-CM | POA: Diagnosis not present

## 2020-10-26 DIAGNOSIS — D631 Anemia in chronic kidney disease: Secondary | ICD-10-CM | POA: Diagnosis present

## 2020-10-26 DIAGNOSIS — N2581 Secondary hyperparathyroidism of renal origin: Secondary | ICD-10-CM | POA: Diagnosis present

## 2020-10-26 DIAGNOSIS — D6959 Other secondary thrombocytopenia: Secondary | ICD-10-CM | POA: Diagnosis present

## 2020-10-26 DIAGNOSIS — E1140 Type 2 diabetes mellitus with diabetic neuropathy, unspecified: Secondary | ICD-10-CM | POA: Diagnosis present

## 2020-10-26 DIAGNOSIS — Z79899 Other long term (current) drug therapy: Secondary | ICD-10-CM | POA: Diagnosis not present

## 2020-10-26 DIAGNOSIS — G9341 Metabolic encephalopathy: Secondary | ICD-10-CM | POA: Diagnosis present

## 2020-10-26 DIAGNOSIS — I160 Hypertensive urgency: Secondary | ICD-10-CM | POA: Diagnosis not present

## 2020-10-26 DIAGNOSIS — I120 Hypertensive chronic kidney disease with stage 5 chronic kidney disease or end stage renal disease: Secondary | ICD-10-CM | POA: Diagnosis present

## 2020-10-26 DIAGNOSIS — G9389 Other specified disorders of brain: Secondary | ICD-10-CM | POA: Diagnosis not present

## 2020-10-26 DIAGNOSIS — R531 Weakness: Secondary | ICD-10-CM | POA: Diagnosis not present

## 2020-10-26 LAB — CBC
HCT: 31.7 % — ABNORMAL LOW (ref 36.0–46.0)
Hemoglobin: 10.9 g/dL — ABNORMAL LOW (ref 12.0–15.0)
MCH: 31.7 pg (ref 26.0–34.0)
MCHC: 34.4 g/dL (ref 30.0–36.0)
MCV: 92.2 fL (ref 80.0–100.0)
Platelets: 90 10*3/uL — ABNORMAL LOW (ref 150–400)
RBC: 3.44 MIL/uL — ABNORMAL LOW (ref 3.87–5.11)
RDW: 14.4 % (ref 11.5–15.5)
WBC: 20.6 10*3/uL — ABNORMAL HIGH (ref 4.0–10.5)
nRBC: 0 % (ref 0.0–0.2)

## 2020-10-26 LAB — BASIC METABOLIC PANEL
Anion gap: 17 — ABNORMAL HIGH (ref 5–15)
BUN: 85 mg/dL — ABNORMAL HIGH (ref 8–23)
CO2: 21 mmol/L — ABNORMAL LOW (ref 22–32)
Calcium: 7.7 mg/dL — ABNORMAL LOW (ref 8.9–10.3)
Chloride: 91 mmol/L — ABNORMAL LOW (ref 98–111)
Creatinine, Ser: 11.02 mg/dL — ABNORMAL HIGH (ref 0.44–1.00)
GFR, Estimated: 3 mL/min — ABNORMAL LOW (ref >60)
Glucose, Bld: 354 mg/dL — ABNORMAL HIGH (ref 70–99)
Potassium: 5.6 mmol/L — ABNORMAL HIGH (ref 3.5–5.1)
Sodium: 129 mmol/L — ABNORMAL LOW (ref 135–145)

## 2020-10-26 LAB — GLUCOSE, CAPILLARY
Glucose-Capillary: 215 mg/dL — ABNORMAL HIGH (ref 70–99)
Glucose-Capillary: 140 mg/dL — ABNORMAL HIGH (ref 70–99)

## 2020-10-26 LAB — HEPATIC FUNCTION PANEL
ALT: 26 U/L (ref 0–44)
AST: 42 U/L — ABNORMAL HIGH (ref 15–41)
Albumin: 3.5 g/dL (ref 3.5–5.0)
Alkaline Phosphatase: 110 U/L (ref 38–126)
Bilirubin, Direct: 0.3 mg/dL — ABNORMAL HIGH (ref 0.0–0.2)
Indirect Bilirubin: 0.5 mg/dL (ref 0.3–0.9)
Total Bilirubin: 0.8 mg/dL (ref 0.3–1.2)
Total Protein: 7.7 g/dL (ref 6.5–8.1)

## 2020-10-26 LAB — HEPATITIS B SURFACE ANTIGEN: Hepatitis B Surface Ag: NONREACTIVE

## 2020-10-26 LAB — CBG MONITORING, ED: Glucose-Capillary: 333 mg/dL — ABNORMAL HIGH (ref 70–99)

## 2020-10-26 MED ORDER — SODIUM CHLORIDE 0.9 % IV SOLN: 100.0000 mL | INTRAVENOUS | Status: DC | PRN

## 2020-10-26 MED ORDER — HYDROCODONE-ACETAMINOPHEN 5-325 MG PO TABS
0.5000 | ORAL_TABLET | Freq: Two times a day (BID) | ORAL | Status: DC | PRN
  Administered 2020-10-31: 0.5 via ORAL
  Filled 2020-10-26: qty 1

## 2020-10-26 MED ORDER — SODIUM CHLORIDE 0.9 % IV SOLN
2.0000 g | INTRAVENOUS | Status: DC
  Administered 2020-10-26: 2 g via INTRAVENOUS
  Filled 2020-10-26: qty 2

## 2020-10-26 MED ORDER — VANCOMYCIN HCL IN DEXTROSE 1-5 GM/200ML-% IV SOLN
1000.0000 mg | INTRAVENOUS | Status: DC
  Filled 2020-10-26: qty 200

## 2020-10-26 MED ORDER — ALBUTEROL SULFATE HFA 108 (90 BASE) MCG/ACT IN AERS
2.0000 | INHALATION_SPRAY | Freq: Four times a day (QID) | RESPIRATORY_TRACT | Status: DC | PRN
  Filled 2020-10-26: qty 6.7

## 2020-10-26 MED ORDER — ONDANSETRON HCL 4 MG/2ML IJ SOLN
4.0000 mg | Freq: Four times a day (QID) | INTRAMUSCULAR | Status: DC | PRN
  Administered 2020-10-26 – 2020-11-07 (×6): 4 mg via INTRAVENOUS
  Filled 2020-10-26 (×6): qty 2

## 2020-10-26 MED ORDER — SEVELAMER CARBONATE 800 MG PO TABS
800.0000 mg | ORAL_TABLET | Freq: Three times a day (TID) | ORAL | Status: DC
  Administered 2020-10-26 – 2020-11-06 (×21): 800 mg via ORAL
  Filled 2020-10-26 (×26): qty 1

## 2020-10-26 MED ORDER — DOXERCALCIFEROL 4 MCG/2ML IV SOLN
2.0000 ug | INTRAVENOUS | Status: DC
  Administered 2020-11-03: 2 ug via INTRAVENOUS
  Filled 2020-10-26 (×6): qty 2

## 2020-10-26 MED ORDER — LABETALOL HCL 5 MG/ML IV SOLN
10.0000 mg | Freq: Once | INTRAVENOUS | Status: AC
  Administered 2020-10-26: 10 mg via INTRAVENOUS
  Filled 2020-10-26: qty 4

## 2020-10-26 MED ORDER — RENA-VITE PO TABS
1.0000 | ORAL_TABLET | Freq: Every day | ORAL | Status: DC
  Administered 2020-10-26 – 2020-11-07 (×12): 1 via ORAL
  Filled 2020-10-26 (×13): qty 1

## 2020-10-26 MED ORDER — PENTAFLUOROPROP-TETRAFLUOROETH EX AERO: 1.0000 "application " | INHALATION_SPRAY | CUTANEOUS | Status: DC | PRN

## 2020-10-26 MED ORDER — IPRATROPIUM-ALBUTEROL 0.5-2.5 (3) MG/3ML IN SOLN
3.0000 mL | Freq: Four times a day (QID) | RESPIRATORY_TRACT | Status: DC | PRN
  Filled 2020-10-26: qty 3

## 2020-10-26 MED ORDER — ACETAMINOPHEN 650 MG RE SUPP: 650.0000 mg | Freq: Four times a day (QID) | RECTAL | Status: DC | PRN

## 2020-10-26 MED ORDER — LIDOCAINE HCL (PF) 1 % IJ SOLN: 5.0000 mL | INTRAMUSCULAR | Status: DC | PRN

## 2020-10-26 MED ORDER — INSULIN GLARGINE 100 UNIT/ML ~~LOC~~ SOLN
7.0000 [IU] | Freq: Every day | SUBCUTANEOUS | Status: DC
  Administered 2020-10-26 – 2020-10-30 (×5): 7 [IU] via SUBCUTANEOUS
  Filled 2020-10-26 (×6): qty 0.07

## 2020-10-26 MED ORDER — GABAPENTIN 100 MG PO CAPS
100.0000 mg | ORAL_CAPSULE | Freq: Three times a day (TID) | ORAL | Status: DC
  Administered 2020-10-26: 100 mg via ORAL
  Filled 2020-10-26: qty 1

## 2020-10-26 MED ORDER — LABETALOL HCL 100 MG PO TABS
100.0000 mg | ORAL_TABLET | Freq: Three times a day (TID) | ORAL | Status: DC
  Administered 2020-10-26 – 2020-10-28 (×7): 100 mg via ORAL
  Filled 2020-10-26 (×7): qty 1

## 2020-10-26 MED ORDER — BENZONATATE 100 MG PO CAPS
100.0000 mg | ORAL_CAPSULE | Freq: Three times a day (TID) | ORAL | Status: DC | PRN
  Administered 2020-10-29 – 2020-11-04 (×4): 100 mg via ORAL
  Filled 2020-10-26 (×4): qty 1

## 2020-10-26 MED ORDER — ONDANSETRON HCL 4 MG PO TABS: 4.0000 mg | ORAL_TABLET | Freq: Four times a day (QID) | ORAL | Status: DC | PRN

## 2020-10-26 MED ORDER — ASPIRIN 81 MG PO CHEW
162.0000 mg | CHEWABLE_TABLET | Freq: Every day | ORAL | Status: DC
  Administered 2020-10-26 – 2020-11-08 (×13): 162 mg via ORAL
  Filled 2020-10-26 (×14): qty 2

## 2020-10-26 MED ORDER — SODIUM CHLORIDE 0.9 % IV SOLN
2.0000 g | INTRAVENOUS | Status: AC
  Administered 2020-10-29 – 2020-10-31 (×2): 2 g via INTRAVENOUS
  Filled 2020-10-26 (×2): qty 2

## 2020-10-26 MED ORDER — SODIUM CHLORIDE 0.9 % IV SOLN
2.0000 g | INTRAVENOUS | Status: AC
  Administered 2020-10-26: 2 g via INTRAVENOUS
  Filled 2020-10-26: qty 2

## 2020-10-26 MED ORDER — HEPARIN SODIUM (PORCINE) 5000 UNIT/ML IJ SOLN
5000.0000 [IU] | Freq: Three times a day (TID) | INTRAMUSCULAR | Status: DC
  Administered 2020-10-26 – 2020-11-08 (×33): 5000 [IU] via SUBCUTANEOUS
  Filled 2020-10-26 (×31): qty 1

## 2020-10-26 MED ORDER — OXYCODONE HCL 5 MG PO TABS
5.0000 mg | ORAL_TABLET | ORAL | Status: DC | PRN
  Administered 2020-10-26: 5 mg via ORAL
  Filled 2020-10-26: qty 1

## 2020-10-26 MED ORDER — LABETALOL HCL 5 MG/ML IV SOLN
10.0000 mg | INTRAVENOUS | Status: DC | PRN
  Administered 2020-10-26: 10 mg via INTRAVENOUS
  Filled 2020-10-26: qty 4

## 2020-10-26 MED ORDER — MORPHINE SULFATE (PF) 2 MG/ML IV SOLN
2.0000 mg | INTRAVENOUS | Status: DC | PRN
  Administered 2020-10-26 (×2): 2 mg via INTRAVENOUS
  Filled 2020-10-26 (×2): qty 1

## 2020-10-26 MED ORDER — INSULIN ASPART 100 UNIT/ML IJ SOLN
0.0000 [IU] | Freq: Three times a day (TID) | INTRAMUSCULAR | Status: DC
  Administered 2020-10-26: 2 [IU] via SUBCUTANEOUS
  Administered 2020-10-27: 3 [IU] via SUBCUTANEOUS
  Administered 2020-10-28: 2 [IU] via SUBCUTANEOUS
  Administered 2020-10-28 – 2020-10-29 (×3): 1 [IU] via SUBCUTANEOUS
  Administered 2020-10-30 (×2): 2 [IU] via SUBCUTANEOUS

## 2020-10-26 MED ORDER — PENTOXIFYLLINE ER 400 MG PO TBCR
400.0000 mg | EXTENDED_RELEASE_TABLET | Freq: Every day | ORAL | Status: DC
  Administered 2020-10-26 – 2020-11-08 (×11): 400 mg via ORAL
  Filled 2020-10-26 (×15): qty 1

## 2020-10-26 MED ORDER — VANCOMYCIN HCL 1750 MG/350ML IV SOLN
1750.0000 mg | INTRAVENOUS | Status: AC
  Administered 2020-10-26: 1750 mg via INTRAVENOUS
  Filled 2020-10-26: qty 350

## 2020-10-26 MED ORDER — LIDOCAINE-PRILOCAINE 2.5-2.5 % EX CREA: 1.0000 "application " | TOPICAL_CREAM | CUTANEOUS | Status: DC | PRN

## 2020-10-26 MED ORDER — HEPARIN SODIUM (PORCINE) 1000 UNIT/ML DIALYSIS: 1000.0000 [IU] | INTRAMUSCULAR | Status: DC | PRN

## 2020-10-26 MED ORDER — ACETAMINOPHEN 325 MG PO TABS
650.0000 mg | ORAL_TABLET | Freq: Four times a day (QID) | ORAL | Status: DC | PRN
  Administered 2020-11-03 – 2020-11-04 (×2): 650 mg via ORAL
  Filled 2020-10-26 (×2): qty 2

## 2020-10-26 MED ORDER — LABETALOL HCL 100 MG PO TABS
100.0000 mg | ORAL_TABLET | Freq: Two times a day (BID) | ORAL | Status: DC
  Administered 2020-10-26: 100 mg via ORAL
  Filled 2020-10-26: qty 1

## 2020-10-26 MED ORDER — VANCOMYCIN HCL IN DEXTROSE 1-5 GM/200ML-% IV SOLN
INTRAVENOUS | Status: AC
  Administered 2020-10-26: 1000 mg via INTRAVENOUS
  Filled 2020-10-26: qty 200

## 2020-10-26 NOTE — Consult Note (Addendum)
[facility] Renal Consultation Note    Indication for Consultation:  Management of ESRD/hemodialysis; anemia, hypertension/volume and secondary hyperparathyroidism  UUV:[id number], Berneta Sages, MD  HPI: [name] is a 80 y.o. female with ESRD on HD MWF at [facility] in [place]. She has a past medical history significant for HTN, DMT2, and CVA.  Patient is a poor historian and was unable to specify her reasoning for coming to the hospital. Spoke to RN from [place]. Patient's last HD treatment was on 10/19/20 in which1.6L was removed. RN also informed me of patient's daughter calling the HD center informing them that patient was not feeling well; causing her to miss the last two treatments. Medical records were reviewed in [place]. Patient presented to the ED c/o CP,SOB, and productive cough. CT chest W/O contrast from 09/20/20 showed minimal R pleural effusion and small sliding-type hiatal hernia. CXR today (-) for any acute abnormalities. Seen and examined patient earlier today on Hemodialysis unit. She reports "feeling okay". Respirations were mildly labored and O2 was applied. She denied CP, ABD, N/V/D. Noted high blood pressures before HD treatment. Patient tolerated treatment today with UF 4L. Blood pressures currently improved. Plan for HD on regular schedule on Monday 10/29/20.  Told me toward the end of HD that she felt better  Past Medical History:  Diagnosis Date  . Anemia   . Cataract   . Chronic kidney disease   . CVA (cerebral infarction)   . Diabetes mellitus    Type 2  . Diabetes mellitus without complication ([place])   . Dialysis patient ([place])   . Dialysis patient ([place])    M, W, F  . Fistula    R arm  . GERD (gastroesophageal reflux disease)   . Hypertension   . Renal disorder   . Shortness of breath   . Stroke [facility])    right side weakness   Past Surgical History:  Procedure Laterality Date  . AMPUTATION Right 01/13/2020   Procedure: AMPUTATION RIGHT FOOT 1ST  RAY;  Surgeon: Newt Minion, MD;  Location: [place];  Service: Orthopedics;  Laterality: Right;  . AV FISTULA PLACEMENT Right 09/08/2013   Procedure: CREATION OF RIGHT BRACHIAL CEPHALIC ARTERIOVENOUS FISTULA ;  Surgeon: Mal Misty, MD;  Location: [place];  Service: Vascular;  Laterality: Right;  . BASCILIC VEIN TRANSPOSITION Right 01/26/2014   Procedure: Right Arm BASILIC VEIN TRANSPOSITION;  Surgeon: [place] Misty, MD;  Location: [place];  Service: Vascular;  Laterality: Right;  . CATARACT EXTRACTION W/PHACO  11/20/2011   Procedure: CATARACT EXTRACTION PHACO AND INTRAOCULAR LENS PLACEMENT (IOC);  Surgeon: Tonny Branch, MD;  Location: [facility];  Service: Ophthalmology;  Laterality: Right;  CDE 18.82  . CATARACT EXTRACTION W/PHACO Left 11/18/2012   Procedure: CATARACT EXTRACTION PHACO AND INTRAOCULAR LENS PLACEMENT (IOC);  Surgeon: Tonny Branch, MD;  Location: [facility];  Service: Ophthalmology;  Laterality: Left;  CDE: 18.97  . COLONOSCOPY N/A 02/09/2013   Procedure: COLONOSCOPY;  Surgeon: Rogene Houston, MD;  [facility];  Service: Endoscopy;  Laterality: N/A;  305-moved to 220 Ann to notify pt  . COLONOSCOPY N/A 07/08/2018   Procedure: COLONOSCOPY;  Surgeon: Rogene Houston, MD;  [facility];  Service: Endoscopy;  Laterality: N/A;  930  . INSERTION OF DIALYSIS CATHETER Right 06/24/2013   Procedure: INSERTION OF DIALYSIS CATHETER: Ultrasound guided;  Surgeon: Serafina Mitchell, MD;  Location: [place];  Service: Vascular;  Laterality: Right;  . LIGATION OF ARTERIOVENOUS  FISTULA  Right 01/26/2014   Procedure: LIGATION OF ARTERIOVENOUS  FISTULA;  Surgeon: [place] Misty, MD;  Location: [place];  Service: Vascular;  Laterality: Right;  . 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 LOOP RECORDER IMPLANT N/A 07/21/2013   Procedure: LOOP RECORDER IMPLANT;  Surgeon: Evans Lance, MD;  Location: [facility] CATH LAB;  Service: Cardiovascular;  Laterality: N/A;  .  POLYPECTOMY  07/08/2018   Procedure: POLYPECTOMY;  Surgeon: Rogene Houston, MD;  [facility];  Service: Endoscopy;;  Descending colon polyps x 2   . TEE WITHOUT CARDIOVERSION N/A 07/21/2013   Procedure: TRANSESOPHAGEAL ECHOCARDIOGRAM (TEE);  Surgeon: Dorothy Spark, MD;  Location: [facility] ENDOSCOPY;  Service: Cardiovascular;  Laterality: N/A;   Family History  Problem Relation Age of Onset  . Cancer Sister   . Cancer Brother   . Anesthesia problems Neg Hx   . Hypotension Neg Hx   . Malignant hyperthermia Neg Hx   . Pseudochol deficiency Neg Hx    Social History:  reports that she has never smoked. She has never used smokeless tobacco. She reports that she does not drink alcohol and does not use drugs. Allergies  Allergen Reactions  . Ambien [Zolpidem] Other (See Comments)    Hallucinations   . Reglan [Metoclopramide] Other (See Comments)    "makes me crazy"   Prior to Admission medications   Medication Sig Start Date End Date Taking? Authorizing Provider  albuterol (PROVENTIL HFA;VENTOLIN HFA) 108 (90 Base) MCG/ACT inhaler Inhale 2 puffs into the lungs every 6 (six) hours as needed for wheezing or shortness of breath. 10/16/17  Yes Kathie Dike, MD  aspirin 81 MG chewable tablet Chew 2 tablets (162 mg total) by mouth daily. 07/09/18  Yes Rehman, Mechele Dawley, MD  BD INSULIN SYRINGE U/F 31G X 5/16" 0.3 ML MISC 2 (two) times daily. as directed 09/15/18  Yes [provider]  benzonatate (TESSALON) 100 MG capsule Take 1 capsule (100 mg total) by mouth every 8 (eight) hours. Patient taking differently: Take 100 mg by mouth 3 (three) times daily as needed for cough. 06/28/20  Yes Faustino Congress, NP  cyanocobalamin (,VITAMIN B-12,) 1000 MCG/ML injection Inject 1 mL into the muscle every 30 (thirty) days. 11/01/14  Yes [provider]  cyclobenzaprine (FLEXERIL) 10 MG tablet Take 0.5 tablets (5 mg total) by mouth 3 (three) times daily as needed for muscle spasms.  09/25/20  Yes Persons, Bevely Palmer, [place]  HYDROcodone-acetaminophen (NORCO/VICODIN) 5-325 MG tablet Take 0.5 tablets by mouth 2 (two) times daily as needed for moderate pain. 10/04/20  Yes [provider]  insulin NPH Human (NOVOLIN N) 100 UNIT/ML injection Inject 0.07 mLs (7 Units total) into the skin 2 (two) times daily before a meal. 01/17/20  Yes Donne Hazel, MD  labetalol (NORMODYNE) 100 MG tablet Take 1 tablet (100 mg total) by mouth 2 (two) times daily. 01/17/20 02/16/20 Yes Donne Hazel, MD  lidocaine-prilocaine (EMLA) cream Apply 1 application topically every Monday, Wednesday, and Friday.  07/01/15  Yes [provider]  multivitamin (RENA-VIT) TABS tablet Take 1 tablet by mouth at bedtime. 08/21/14  Yes Kirsteins, Luanna Salk, MD  omeprazole (PRILOSEC) 20 MG capsule Take 1 capsule (20 mg total) by mouth daily. 03/24/20  Yes Maudie Flakes, MD  pentoxifylline (TRENTAL) 400 MG CR tablet Take 1 tablet (400 mg total) by mouth daily with breakfast. 02/22/20  Yes Persons, Bevely Palmer, [place]  predniSONE (STERAPRED UNI-PAK 21 TAB) 5 MG (21) TBPK tablet Take 5 mg by mouth See admin instructions. 6,5,4,3,2,1 10/23/20  Yes [provider]  sevelamer carbonate (RENVELA) 800 MG tablet Take 800 mg by mouth 3 (three) times daily with meals.  12/24/17  Yes [provider]  gabapentin (NEURONTIN) 100 MG capsule Take 1 capsule (100 mg total) by mouth 3 (three) times daily. Patient not taking: No sig reported 04/12/20   Emerson Monte, FNP  nitroGLYCERIN (NITRODUR - DOSED IN MG/24 HR) 0.2 mg/hr patch Place 1 patch (0.2 mg total) onto the skin daily. (For Foot) Patient not taking: No sig reported 04/24/20   Newt Minion, MD  oxyCODONE (OXY IR/ROXICODONE) 5 MG immediate release tablet Take 1 tablet (5 mg total) by mouth every 8 (eight) hours as needed for severe pain. Patient not taking: Reported on 10/25/2020 03/15/20   Persons, Bevely Palmer, [place]  silver sulfADIAZINE (SILVADENE) 1 % cream Apply  1 application topically daily. Patient not taking: Reported on 10/25/2020 03/14/20   Suzan Slick, NP  sucralfate (CARAFATE) 1 g tablet Take 1 tablet (1 g total) by mouth 4 (four) times daily as needed. Patient not taking: No sig reported 03/24/20   Maudie Flakes, MD   Current Facility-Administered Medications  Medication Dose Route Frequency Provider Last Rate Last Admin  . acetaminophen (TYLENOL) tablet 650 mg  650 mg Oral Q6H PRN Nicoletta Dress, Na, MD       Or  . acetaminophen (TYLENOL) suppository 650 mg  650 mg Rectal Q6H PRN Nicoletta Dress, Na, MD      . albuterol (VENTOLIN HFA) 108 (90 Base) MCG/ACT inhaler 2 puff  2 puff Inhalation Q6H PRN Nicoletta Dress, Na, MD      . aspirin chewable tablet 162 mg  162 mg Oral Daily Li, Na, MD   162 mg at 10/26/20 1223  . benzonatate (TESSALON) capsule 100 mg  100 mg Oral TID PRN Charlann Lange, MD      . Derrill Memo ON 10/29/2020] ceFEPIme (MAXIPIME) 2 g in sodium chloride 0.9 % 100 mL IVPB  2 g Intravenous Q M,W,F-2000 Hammons, Kimberly B, RPH      . Chlorhexidine Gluconate Cloth 2 % PADS 6 each  6 each Topical Q0600 Li, Na, MD      . heparin injection 5,000 Units  5,000 Units Subcutaneous Q8H Alma Friendly, MD   5,000 Units at 10/26/20 1410  . HYDROcodone-acetaminophen (NORCO/VICODIN) 5-325 MG per tablet 0.5 tablet  0.5 tablet Oral BID PRN Nicoletta Dress, Na, MD      . insulin aspart (novoLOG) injection 0-6 Units  0-6 Units Subcutaneous TID WC Nicoletta Dress, Na, MD   2 Units at 10/26/20 1411  . insulin glargine (LANTUS) injection 7 Units  7 Units Subcutaneous QHS Li, Na, MD      . ipratropium-albuterol (DUONEB) 0.5-2.5 (3) MG/3ML nebulizer solution 3 mL  3 mL Nebulization Q6H PRN Alma Friendly, MD      . labetalol (NORMODYNE) injection 10 mg  10 mg Intravenous Q4H PRN Nicoletta Dress, Na, MD   10 mg at 10/26/20 1221  . labetalol (NORMODYNE) tablet 100 mg  100 mg Oral TID Alma Friendly, MD      . multivitamin (RENA-VIT) tablet 1 tablet  1 tablet Oral QHS Li, Na, MD      . ondansetron (ZOFRAN) tablet 4 mg  4 mg  Oral Q6H PRN Nicoletta Dress, Na, MD       Or  . ondansetron (ZOFRAN) injection 4 mg  4 mg Intravenous Q6H PRN Nicoletta Dress, Na, MD      . pentoxifylline (TRENTAL) CR tablet 400 mg  400 mg Oral Q breakfast Nicoletta Dress, Na, MD   400 mg at 10/26/20 1409  . sevelamer carbonate (RENVELA) tablet 800 mg  800 mg Oral TID WC Li, Na, MD   800 mg at 10/26/20 1223  . vancomycin (VANCOCIN) IVPB 1000 mg/200 mL premix  1,000 mg Intravenous Q M,W,F-HD Franky Macho, [facility]   Stopped at 10/26/20 1042   Labs: Basic Metabolic Panel: Recent Labs  Lab 10/25/20 1925 10/26/20 0226  NA 127* 129*  K 6.2* 5.6*  CL 88* 91*  CO2 22 21*  GLUCOSE 610* 354*  BUN 82* 85*  CREATININE 10.55* 11.02*  CALCIUM 7.7* 7.7*   Liver Function Tests: Recent Labs  Lab 10/26/20 0226  AST 42*  ALT 26  ALKPHOS 110  BILITOT 0.8  PROT 7.7  ALBUMIN 3.5   No results for input(s): LIPASE, AMYLASE in the last 168 hours. No results for input(s): AMMONIA in the last 168 hours. CBC: Recent Labs  Lab 10/25/20 1925 10/26/20 0449  WBC 18.1* 20.6*  NEUTROABS 15.9*  --   HGB 11.8* 10.9*  HCT 34.6* 31.7*  MCV 93.0 92.2  PLT 130* 90*   CBG: Recent Labs  Lab 10/25/20 2237 10/26/20 0058 10/26/20 1342  GLUCAP 507* 333* 215*   Studies/Results: DG Chest Portable 1 View  Result Date: 10/25/2020 CLINICAL DATA:  Shortness of breath and increasing right-sided chest pain EXAM: PORTABLE CHEST 1 VIEW COMPARISON:  09/20/2020 FINDINGS: Cardiac shadow is enlarged. Loop recorder is again seen. Lungs are well aerated without focal infiltrate or sizable effusion. No bony abnormality is seen. Stenting is noted in the right arm. IMPRESSION: No acute abnormality noted. Electronically Signed   By: Inez Catalina M.D.   On: 10/25/2020 19:48   Physical Exam: Vitals:   10/26/20 1119 10/26/20 1222 10/26/20 1226 10/26/20 1500  BP: (!) 207/95 (!) 232/98 (!) 191/79 (!) 136/57  Pulse: 93 (!) 101 93   Resp: (!) 25 (!) 24 (!) 30 20  Temp: (!) 97.5 F (36.4 C) (!) 97.4 F (36.3  C) 98.9 F (37.2 C)   TempSrc: Oral Oral Oral   SpO2: 100% 98% 99% 98%  Weight:      Height:         General: Ill-appearing; mild distress; O2 in place Head: NCAT sclera not icteric MMM Lungs: Breathing mildly labored; Expiratory wheezing throughout; No rales Heart: RRR. No murmur, rubs or gallops.  Abdomen: large, soft, nontender, +BS Lower extremities: 1+ edema,  No ischemic changes, or open wounds  Neuro: Oriented to herself at this time; Moves all extremities spontaneously. Dialysis Access: R AVF (+) Bruit/Thrill  Dialysis Orders:  MWF-[facility] in [place]  4hrs, BFR 400, DFR 500,  EDW 81kg, 2K/ 2.5Ca  Access: RUE AVF Heparin: none Epogen 2000 units IV push 3x qw Hectorol 11mcg IV 3x qw Home meds: Renvela 800mg  3 tabs po tid with meals and 2 with snacks  Last Labs: Hgb 10.9, K 5.6, Ca 7.7, Alb 3.5  Assessment/Plan: 1.  Possible HCAP: WBC elevated on admission-now 20.6. BC cultures X 2 pending. COVID and Flu (-). Noted patient with expiratory wheezing. CXR showed no acute abnormalities. Continue ABX (maxepime and vanc) and DuoNebs 2.  ESRD -  On HD MWF-seen patient on HD this morning-received HD today-tolerated UF of 4L. Will monitor volume status-plan for HD on Monday 10/29/20.  Did not get post weight-  I suspect will need a lower EDW at discharge 3.  Hypertension/volume  - Noted high Bps sys 200s-BP now improved after HD, more likley in the setting of volume overload. Will monitor patient's fluid status. 4.  Anemia of CKD - Hgb slowly trending downward-was 11.8 at admission now 10.9-may have to resume ESA-will monitor. 5.  Secondary Hyperparathyroidism -  CorrCa 8.1- will resume Hectorol. Will also order PO4 level at next lab draw and continue renvela. 6.  Nutrition - Continue renal diet with fluid restriction 7. Hyperkalemia-  Was given one dose of lokelma-  Then had HD to correct-  Labs ordered for tomorrow to monitor  Tobie Poet, NP [facility] 10/26/2020, 4:15 PM   Patient seen and examined, agree with above note with above modifications.  80 year old BF with ESRD -  MWF at [facility] who presents with PNA, as well as volume overload and hyperkalemia in the setting of missed HD.  Got semi emergent HD early this AM with 4 liters UF -  Good clinical results.  Will continue dialysis related medications and follow pt-  Next HD planned for Monday on schedule  Corliss Parish, MD 10/26/2020

## 2020-10-26 NOTE — Progress Notes (Signed)
Inpatient Diabetes Program Recommendations  AACE/ADA: New Consensus Statement on Inpatient Glycemic Control (2015)  Target Ranges:  Prepandial:   less than 140 mg/dL      Peak postprandial:   less than 180 mg/dL (1-2 hours)      Critically ill patients:  140 - 180 mg/dL   Lab Results  Component Value Date   GLUCAP 333 (H) 10/26/2020   HGBA1C 7.7 (H) 01/05/2020    Review of Glycemic Control  Diabetes history: type 2 Outpatient Diabetes medications: NPH 7 units BID Current orders for Inpatient glycemic control: Lantus 7 units at HS, Novolog 0-6 units correction scale TID  Inpatient Diabetes Program Recommendations:   Received diabetes coordinator consult. Agree with current insulin orders. Will continue to monitor blood sugars while in the hospital. A current HgbA1C would probably not be accurate due to her ESRD.   Harvel Ricks RN BSN CDE Diabetes Coordinator Pager: [phone]  8am-5pm

## 2020-10-26 NOTE — ED Notes (Signed)
Pt calls out again.  Pt reports that she does not feel good, repositioned pt and gave her a sip of water.  MD messaged.  VSS

## 2020-10-26 NOTE — Progress Notes (Signed)
Pharmacy Antibiotic Note  [name] is a 80 y.o. female admitted on 10/25/2020 with pneumonia.  Pharmacy has been consulted for Vancomycin and Cefepime dosing. Pt is ESRD with HD on M/W/F but missed HD on Wed.  Plan: Cefepime 2gm IV now and qHD Vancomycin 1750mg  IV now then  100mg  mg IV Q HD  Will f/u HD schedule, micro data, and pt's clinical condition Vanc levels prn   Height: 5\' 7"  (170.2 cm) Weight: 83 kg (183 lb) IBW/kg (Calculated) : 61.6  Temp (24hrs), Avg:98.9 F (37.2 C), Min:98.4 F (36.9 C), Max:99.6 F (37.6 C)  Recent Labs  Lab 10/25/20 1925 10/26/20 0226  WBC 18.1*  --   CREATININE 10.55* 11.02*    Estimated Creatinine Clearance: 4.4 mL/min (A) (by C-G formula based on SCr of 11.02 mg/dL (H)).    Allergies  Allergen Reactions  . Ambien [Zolpidem] Other (See Comments)    Hallucinations   . Reglan [Metoclopramide] Other (See Comments)    "makes me crazy"    Antimicrobials this admission: 5/13 Vanc >>  5/13 Cefepime >>   Microbiology results: Pending   Thank you for allowing pharmacy to be a part of this patient's care.  Sherlon Handing, PharmD, BCPS Please see amion for complete clinical pharmacist phone list 10/26/2020 4:59 AM

## 2020-10-26 NOTE — H&P (Addendum)
History and Physical    [name] TDH:[id number] DOB: 08/02/1940 DOA: 10/25/2020  PCP: [name], MD Patient coming from:  home  Chief Complaint: Chest pain and SOB  HPI: [name] is a 80 y.o. female with medical history significant of ESRD on HD MWF, HTN, T2DM, stroke, who presented with chest pain and shortness of breath.  Patient is a poor historian.  The HPI is obtained by chart review, talking to patient and ED staff.  Patient reports that she has had right-sided chest pain "for a while". Patient could not tell me how long she has had chest pain.  Per ED provider, she reported chest pain 1.5 weeks to ED Doctor. Patient is poor historian and unable to give much details about her chest pain. She was evaluated for right rib pain at the ED on 09/20/20. Chest CT W/O contrast suggested "Minimal right pleural effusion" and " small sliding style hiatal hernia".  Patient also reports increased shortness of breath and cough for a few days but unable to provide details either. She apparently missed her dialysis yesterday.  Patient came to ED for further evaluation.  In the emergency room, she was afebrile with pulse 100s, RR 22, BP 243/104 and O2 sat 90% on 2 LNC.  The labs showed Wayland Baik 127, K 6.2, BG 610, BUN 82, Cr 10.55, troponin 34, WBC 18.1, negative COVID and flu.  EKG showed sinus tachycardia. CXR showed no acute changes.   Review of Systems: As per HPI otherwise 10 point review of systems negative.  Review of Systems Otherwise negative except as per HPI, including: General: Denies fever, chills, night sweats or unintended weight loss. Resp: positive for cough, wheezing, shortness of breath. Cardiac: Denies palpitations, orthopnea, paroxysmal nocturnal dyspnea.  Positive for chest pain GI: Denies abdominal pain, nausea, vomiting, diarrhea or constipation GU: Denies dysuria, frequency, hesitancy or incontinence MS: Denies muscle aches, joint pain or swelling Neuro: Denies  headache, neurologic deficits (focal weakness, numbness, tingling), abnormal gait Psych: Denies anxiety, depression, SI/HI/AVH Skin: Denies new rashes or lesions ID: Denies sick contacts, exotic exposures, travel  Past Medical History:  Diagnosis Date  . Anemia   . Cataract   . Chronic kidney disease   . CVA (cerebral infarction)   . Diabetes mellitus    Type 2  . Diabetes mellitus without complication ([place])   . Dialysis patient ([place])   . Dialysis patient ([place])    M, W, F  . Fistula    R arm  . GERD (gastroesophageal reflux disease)   . Hypertension   . Renal disorder   . Shortness of breath   . Stroke [facility])    right side weakness    Past Surgical History:  Procedure Laterality Date  . AMPUTATION Right 01/13/2020   Procedure: AMPUTATION RIGHT FOOT 1ST RAY;  Surgeon: Newt Minion, MD;  Location: [place];  Service: Orthopedics;  Laterality: Right;  . AV FISTULA PLACEMENT Right 09/08/2013   Procedure: CREATION OF RIGHT BRACHIAL CEPHALIC ARTERIOVENOUS FISTULA ;  Surgeon: Mal Misty, MD;  Location: [place];  Service: Vascular;  Laterality: Right;  . BASCILIC VEIN TRANSPOSITION Right 01/26/2014   Procedure: Right Arm BASILIC VEIN TRANSPOSITION;  Surgeon: Mal Misty, MD;  Location: [place];  Service: Vascular;  Laterality: Right;  . CATARACT EXTRACTION W/PHACO  11/20/2011   Procedure: CATARACT EXTRACTION PHACO AND INTRAOCULAR LENS PLACEMENT (IOC);  Surgeon: Tonny Branch, MD;  Location: [facility];  Service: Ophthalmology;  Laterality: Right;  CDE  18.82  . CATARACT EXTRACTION W/PHACO Left 11/18/2012   Procedure: CATARACT EXTRACTION PHACO AND INTRAOCULAR LENS PLACEMENT (IOC);  Surgeon: Tonny Branch, MD;  Location: [facility];  Service: Ophthalmology;  Laterality: Left;  CDE: 18.97  . COLONOSCOPY N/A 02/09/2013   Procedure: COLONOSCOPY;  Surgeon: Rogene Houston, MD;  [facility];  Service: Endoscopy;  Laterality: N/A;  305-moved to 220 Ann to notify pt  . COLONOSCOPY N/A 07/08/2018    Procedure: COLONOSCOPY;  Surgeon: Rogene Houston, MD;  [facility];  Service: Endoscopy;  Laterality: N/A;  930  . INSERTION OF DIALYSIS CATHETER Right 06/24/2013   Procedure: INSERTION OF DIALYSIS CATHETER: Ultrasound guided;  Surgeon: Serafina Mitchell, MD;  Location: [place];  Service: Vascular;  Laterality: Right;  . LIGATION OF ARTERIOVENOUS  FISTULA Right 01/26/2014   Procedure: LIGATION OF ARTERIOVENOUS  FISTULA;  Surgeon: Mal Misty, MD;  Location: [place];  Service: Vascular;  Laterality: Right;  . 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 LOOP RECORDER IMPLANT N/A 07/21/2013   Procedure: LOOP RECORDER IMPLANT;  Surgeon: Evans Lance, MD;  Location: [facility] C[facility];  Service: Cardiovascular;  Laterality: N/A;  . POLYPECTOMY  07/08/2018   Procedure: POLYPECTOMY;  Surgeon: Rogene Houston, MD;  [facility];  Service: Endoscopy;;  Descending colon polyps x 2   . TEE WITHOUT CARDIOVERSION N/A 07/21/2013   Procedure: TRANSESOPHAGEAL ECHOCARDIOGRAM (TEE);  Surgeon: Dorothy Spark, MD;  Location: [place];  Service: Cardiovascular;  Laterality: N/A;    SOCIAL HISTORY:  reports that she has never smoked. She has never used smokeless tobacco. She reports that she does not drink alcohol and does not use drugs.  Allergies  Allergen Reactions  . Ambien [Zolpidem] Other (See Comments)    Hallucinations   . Reglan [Metoclopramide] Other (See Comments)    "makes me crazy"    FAMILY HISTORY: Family History  Problem Relation Age of Onset  . Cancer Sister   . Cancer Brother   . Anesthesia problems Neg Hx   . Hypotension Neg Hx   . Malignant hyperthermia Neg Hx   . Pseudochol deficiency Neg Hx      Prior to Admission medications   Medication Sig Start Date End Date Taking? Authorizing Provider  albuterol (PROVENTIL HFA;VENTOLIN HFA) 108 (90 Base) MCG/ACT inhaler Inhale 2 puffs into the lungs every 6 (six) hours as  needed for wheezing or shortness of breath. 10/16/17  Yes Kathie Dike, MD  aspirin 81 MG chewable tablet Chew 2 tablets (162 mg total) by mouth daily. 07/09/18  Yes Rehman, Mechele Dawley, MD  BD INSULIN SYRINGE U/F 31G X 5/16" 0.3 ML MISC 2 (two) times daily. as directed 09/15/18  Yes [provider]  benzonatate (TESSALON) 100 MG capsule Take 1 capsule (100 mg total) by mouth every 8 (eight) hours. Patient taking differently: Take 100 mg by mouth 3 (three) times daily as needed for cough. 06/28/20  Yes Faustino Congress, NP  cyanocobalamin (,VITAMIN B-12,) 1000 MCG/ML injection Inject 1 mL into the muscle every 30 (thirty) days. 11/01/14  Yes [provider]  cyclobenzaprine (FLEXERIL) 10 MG tablet Take 0.5 tablets (5 mg total) by mouth 3 (three) times daily as needed for muscle spasms. 09/25/20  Yes Persons, Bevely Palmer, [place]  HYDROcodone-acetaminophen (NORCO/VICODIN) 5-325 MG tablet Take 0.5 tablets by mouth 2 (two) times daily as needed for moderate pain. 10/04/20  Yes [provider]  insulin NPH Human (NOVOLIN N) 100 UNIT/ML injection Inject 0.07 mLs (7 Units total) into the skin 2 (two) times daily before a meal. 01/17/20  Yes Donne Hazel, MD  labetalol (NORMODYNE) 100 MG tablet Take 1 tablet (100 mg total) by mouth 2 (two) times daily. 01/17/20 02/16/20 Yes Donne Hazel, MD  lidocaine-prilocaine (EMLA) cream Apply 1 application topically every Monday, Wednesday, and Friday.  07/01/15  Yes [provider]  multivitamin (RENA-VIT) TABS tablet Take 1 tablet by mouth at bedtime. 08/21/14  Yes Kirsteins, Luanna Salk, MD  omeprazole (PRILOSEC) 20 MG capsule Take 1 capsule (20 mg total) by mouth daily. 03/24/20  Yes Maudie Flakes, MD  pentoxifylline (TRENTAL) 400 MG CR tablet Take 1 tablet (400 mg total) by mouth daily with breakfast. 02/22/20  Yes Persons, Bevely Palmer, PA  predniSONE (STERAPRED UNI-PAK 21 TAB) 5 MG (21) TBPK tablet Take 5 mg by mouth See admin instructions.  6,5,4,3,2,1 10/23/20  Yes [provider]  sevelamer carbonate (RENVELA) 800 MG tablet Take 800 mg by mouth 3 (three) times daily with meals.  12/24/17  Yes [provider]  gabapentin (NEURONTIN) 100 MG capsule Take 1 capsule (100 mg total) by mouth 3 (three) times daily. Patient not taking: No sig reported 04/12/20   Emerson Monte, FNP  nitroGLYCERIN (NITRODUR - DOSED IN MG/24 HR) 0.2 mg/hr patch Place 1 patch (0.2 mg total) onto the skin daily. (For Foot) Patient not taking: No sig reported 04/24/20   Newt Minion, MD  oxyCODONE (OXY IR/ROXICODONE) 5 MG immediate release tablet Take 1 tablet (5 mg total) by mouth every 8 (eight) hours as needed for severe pain. Patient not taking: Reported on 10/25/2020 03/15/20   Persons, Bevely Palmer, [place]  silver sulfADIAZINE (SILVADENE) 1 % cream Apply 1 application topically daily. Patient not taking: Reported on 10/25/2020 03/14/20   Suzan Slick, NP  sucralfate (CARAFATE) 1 g tablet Take 1 tablet (1 g total) by mouth 4 (four) times daily as needed. Patient not taking: No sig reported 03/24/20   Maudie Flakes, MD    Physical Exam: Vitals:   10/26/20 0200 10/26/20 0215 10/26/20 0300 10/26/20 0313  BP: (!) 153/125 (!) 208/105 (!) 210/89 (!) 179/83  Pulse: 90 98 93 90  Resp: (!) 40 (!) 35 (!) 32 (!) 37  Temp:    99.6 F (37.6 C)  TempSrc:    Oral  SpO2: 97% 99% 98% 97%  Weight:      Height:          Constitutional: NAD, anxious.  Acute ill-appearing. Eyes: PERRL, lids and conjunctivae normal ENMT: Mucous membranes are moist. Posterior pharynx clear of any exudate or lesions.Normal dentition.  Neck: normal, supple, no masses, no thyromegaly Respiratory: Bilateral lungs sound coarse with bibasilar rales.  Rhonchi's no wheezings to anterior and posterior lungs.  Reproducible right-sided chest tenderness on exam.  Normal respiratory effort. No accessory muscle use.  Cardiovascular: Regular rate and rhythm, no murmurs / rubs /  gallops. No extremity edema. 2+ pedal pulses. No carotid bruits.  Abdomen: no tenderness, no masses palpated. No hepatosplenomegaly. Bowel sounds positive.  Musculoskeletal: no clubbing / cyanosis. No joint deformity upper and lower extremities. Good ROM, no contractures. Normal muscle tone.  Skin: no rashes, lesions, ulcers. No induration Neurologic: CN 2-12 grossly intact. Sensation intact, DTR normal. Strength 5/5 in all 4.  Psychiatric: Normal judgment and insight. Alert and oriented x 3. Normal mood.  Labs on Admission: I have personally reviewed following labs and imaging studies  CBC: Recent Labs  Lab 10/25/20 1925  WBC 18.1*  NEUTROABS 15.9*  HGB 11.8*  HCT 34.6*  MCV 93.0  PLT 338*   Basic Metabolic Panel: Recent Labs  Lab 10/25/20 1925 10/26/20 0226  Kadeem Hyle 127* 129*  K 6.2* 5.6*  CL 88* 91*  CO2 22 21*  GLUCOSE 610* 354*  BUN 82* 85*  CREATININE 10.55* 11.02*  CALCIUM 7.7* 7.7*   GFR: Estimated Creatinine Clearance: 4.4 mL/min (A) (by C-G formula based on SCr of 11.02 mg/dL (H)). Liver Function Tests: Recent Labs  Lab 10/26/20 0226  AST 42*  ALT 26  ALKPHOS 110  BILITOT 0.8  PROT 7.7  ALBUMIN 3.5   No results for input(s): LIPASE, AMYLASE in the last 168 hours. No results for input(s): AMMONIA in the last 168 hours. Coagulation Profile: No results for input(s): INR, PROTIME in the last 168 hours. Cardiac Enzymes: No results for input(s): CKTOTAL, CKMB, CKMBINDEX, TROPONINI in the last 168 hours. BNP (last 3 results) No results for input(s): PROBNP in the last 8760 hours. HbA1C: No results for input(s): HGBA1C in the last 72 hours. CBG: Recent Labs  Lab 10/25/20 2237 10/26/20 0058  GLUCAP 507* 333*   Lipid Profile: No results for input(s): CHOL, HDL, LDLCALC, TRIG, CHOLHDL, LDLDIRECT in the last 72 hours. Thyroid Function Tests: No results for input(s): TSH, T4TOTAL, FREET4, T3FREE, THYROIDAB in the last 72 hours. Anemia Panel: No  results for input(s): VITAMINB12, FOLATE, FERRITIN, TIBC, IRON, RETICCTPCT in the last 72 hours. Urine analysis:    Component Value Date/Time   COLORURINE YELLOW 10/15/2017 2244   APPEARANCEUR HAZY (A) 10/15/2017 2244   LABSPEC 1.015 10/15/2017 2244   PHURINE 7.0 10/15/2017 2244   GLUCOSEU >=500 (A) 10/15/2017 2244   HGBUR SMALL (A) 10/15/2017 2244   BILIRUBINUR NEGATIVE 10/15/2017 2244   KETONESUR NEGATIVE 10/15/2017 2244   PROTEINUR 100 (A) 10/15/2017 2244   UROBILINOGEN 0.2 11/19/2014 0909   NITRITE NEGATIVE 10/15/2017 2244   LEUKOCYTESUR MODERATE (A) 10/15/2017 2244   Sepsis Labs: !!!!!!!!!!!!!!!!!!!!!!!!!!!!!!!!!!!!!!!!!!!! @LABRCNTIP (procalcitonin:4,lacticidven:4) ) Recent Results (from the past 240 hour(s))  Resp Panel by RT-PCR (Flu A&B, Covid) Nasopharyngeal Swab     Status: None   Collection Time: 10/25/20  7:26 PM   Specimen: Nasopharyngeal Swab; Nasopharyngeal(NP) swabs in vial transport medium  Result Value Ref Range Status   SARS Coronavirus 2 by RT PCR NEGATIVE NEGATIVE Final    Comment: (NOTE) SARS-CoV-2 target nucleic acids are NOT DETECTED.  The SARS-CoV-2 RNA is generally detectable in upper respiratory specimens during the acute phase of infection. The lowest concentration of SARS-CoV-2 viral copies this assay can detect is 138 copies/mL. A negative result does not preclude SARS-Cov-2 infection and should not be used as the sole basis for treatment or other patient management decisions. A negative result may occur with  improper specimen collection/handling, submission of specimen other than nasopharyngeal swab, presence of viral mutation(s) within the areas targeted by this assay, and inadequate number of viral copies(<138 copies/mL). A negative result must be combined with clinical observations, patient history, and epidemiological information. The expected result is Negative.  Fact Sheet for Patients:  EntrepreneurPulse.com.au  Fact  Sheet for Healthcare Providers:  IncredibleEmployment.be  This test is no t yet approved or cleared by the [place] FDA and  has been authorized for detection and/or diagnosis of SARS-CoV-2 by FDA under an Emergency Use Authorization (EUA). This EUA will remain  in  effect (meaning this test can be used) for the duration of the COVID-19 declaration under Section 564(b)(1) of the Act, 21 U.S.C.section 360bbb-3(b)(1), unless the authorization is terminated  or revoked sooner.       Influenza A by PCR NEGATIVE NEGATIVE Final   Influenza B by PCR NEGATIVE NEGATIVE Final    Comment: (NOTE) The Xpert Xpress SARS-CoV-2/FLU/RSV plus assay is intended as an aid in the diagnosis of influenza from Nasopharyngeal swab specimens and should not be used as a sole basis for treatment. Nasal washings and aspirates are unacceptable for Xpert Xpress SARS-CoV-2/FLU/RSV testing.  Fact Sheet for Patients: EntrepreneurPulse.com.au  Fact Sheet for Healthcare Providers: IncredibleEmployment.be  This test is not yet approved or cleared by the [place] FDA and has been authorized for detection and/or diagnosis of SARS-CoV-2 by FDA under an Emergency Use Authorization (EUA). This EUA will remain in effect (meaning this test can be used) for the duration of the COVID-19 declaration under Section 564(b)(1) of the Act, 21 U.S.C. section 360bbb-3(b)(1), unless the authorization is terminated or revoked.  Performed at [facility], [place] [address] [place],  [address]      Radiological Exams on Admission: DG Chest Portable 1 View  Result Date: 10/25/2020 CLINICAL DATA:  Shortness of breath and increasing right-sided chest pain EXAM: PORTABLE CHEST 1 VIEW COMPARISON:  09/20/2020 FINDINGS: Cardiac shadow is enlarged. Loop recorder is again seen. Lungs are well aerated without focal infiltrate or sizable effusion. No bony abnormality  is seen. Stenting is noted in the right arm. IMPRESSION: No acute abnormality noted. Electronically Signed   By: Inez Catalina M.D.   On: 10/25/2020 19:48     All images have been reviewed by me personally.  EKG: Independently reviewed.   Assessment/Plan Principal Problem:   Acute respiratory failure with hypoxia (HCC) Active Problems:   HLD (hyperlipidemia)   Hypertension associated with diabetes ([place])   HCAP (healthcare-associated pneumonia)   Hyperkalemia   Diabetes mellitus with complication (HCC)   ESRD (end stage renal disease) on dialysis ([place])   History of CVA (cerebrovascular accident)   Elevated troponin   Volume overload   Hyperglycemia   Hypertensive urgency   Assessment  plan  # acute hypoxia respiratory failure # suspected HCAP #Elevated troponin  Patient reports right-sided chest pain, shortness of breath and productive cough.  She was noted to have low-grade temperature of 99.94F and WBC 18K.  On exam she was noted to have a bibasilar rales.  Rhonchi and wheezing anterior and posteriorly. Though her CXR showed no acute changes, clinically she could have PNA. Will empirically cover her with Abx. If her symptoms drastically resolved after dialysis, consider to de-escalate her antibiotics  -Telemetry monitoring -On 2 LNC -Blood cultures pending -Negative COVID and flu -Antibiotics-vancomycin and cefepime -Dialysis on 10/26/2020 -Troponin mildly elevated with no ischemic change on EKG-likely demand supply in the setting of poor renal clearance  # Hyperkalemia #Pseudohyponatremia, Teneil Shiller corrected to be 135 #  Volume overload # ESRD on HD MWF  -Patient missed dialysis on 10/24/2020 - K 6.2 which improved to 5.6 after treatment of insulin and Lokelma -Nephrologist plans for dialysis in the morning  # Hyperglycemia # T2DM  - BG 610 upon arrival to ED which improved to 333 after NovoLog 10 units IV x1 at ED - not in DKA or HHS -Glucose before meals and at bedtime    #Hypertensive urgency #Essential hypertension - BP was 243/104 at the ED which improved with IV labetalol -  Resume home antihypertensive meds -Labetalol IV as needed                     Body mass index is 28.66 kg/m.        DVT prophylaxis: SCD Code Status: Full code Family Communication: one at bedside Consults called: nephrology Admission status: obs  Status is: Observation  The patient remains OBS appropriate and will d/c before 2 midnights.  Dispo: The patient is from: Home              Anticipated d/c is to: Home              Patient currently is not medically stable to d/c.   Difficult to place patient No       Time Spent: 65 minutes.  >50% of the time was devoted to discussing the patients care, assessment, plan and disposition with other care givers along with counseling the patient about the risks and benefits of treatment.    Charlann Lange MD [facility]  If 7PM-7AM, please contact night-coverage   10/26/2020, 5:06 AM

## 2020-10-26 NOTE — ED Notes (Signed)
Pt called out and when I checked on her she reports that she was not feeling well.  CBG checked and pt pulled up in bed and repositioned.  Pt appears tachypneic still but states that she feels better.  Will continue to monitor

## 2020-10-26 NOTE — Progress Notes (Signed)
Attempted to talk with patient in her room. Patient is moaning and not able to answer my questions. No family member was in the room at the time.   Will continue to monitor blood sugars while in the hospital.  Harvel Ricks RN BSN CDE Diabetes Coordinator Pager: [phone]  8am-5pm

## 2020-10-26 NOTE — ED Notes (Signed)
Pt continues to call out and is restless, Admitting MD called me back, new orders received.  Pt is reporting abdominal and back pain.  Medicated and repositioned after EKG

## 2020-10-26 NOTE — ED Notes (Signed)
Pt is calmer but still calling out.  Admitting MD was at the bedside.  Additional pain medication and BP medication given.

## 2020-10-26 NOTE — Progress Notes (Signed)
PROGRESS NOTE  [name] XTK:[id number] DOB: 05/04/1941 DOA: 10/25/2020 PCP: [name], MD  HPI/Recap of past 24 hours: HPI from Dr Wilnette Kales is a 80 y.o. female with medical history significant of ESRD on HD MWF, HTN, T2DM, stroke, who presented with chest pain and shortness of breath.  Patient is a poor historian.  The HPI was obtained by chart review, talking to patient and ED staff. Patient reports that she has had right-sided chest pain "for a while", unable to explain further. Pt was evaluated for right rib pain at the ED on 09/20/20. Chest CT W/O contrast suggested "Minimal right pleural effusion" and " small sliding style hiatal hernia".  Patient also reports increased shortness of breath and productive cough of brownish sputum for a few days. She apparently missed her last HD session. In the ED, pt was afebrile with pulse 100s, RR 22, BP 243/104 and O2 sat 90% on 2 LNC. Labs showed Na 127, K 6.2, BG 610, BUN 82, Cr 10.55, troponin 34, WBC 18.1, negative COVID and flu.  EKG showed sinus tachycardia. CXR showed no acute changes.  Patient admitted for further management.    Today, saw patient during HD, noted to be coughing and wheezing.  Patient denied any chest pain, noted to be short of breath, denies any abdominal pain, nausea/vomiting, fever/chills.   Assessment/Plan: Principal Problem:   Acute respiratory failure with hypoxia (HCC) Active Problems:   HLD (hyperlipidemia)   Hypertension associated with diabetes ([place])   HCAP (healthcare-associated pneumonia)   Hyperkalemia   Diabetes mellitus with complication (HCC)   ESRD (end stage renal disease) on dialysis [facility])   History of CVA (cerebrovascular accident)   Elevated troponin   Volume overload   Hyperglycemia   Hypertensive urgency   Likely volume overload due to missed HD ESRD on HD MWF Hyperkalemia, hyponatremia Nephrology consulted, had HD on 10/26/2020, further sessions per nephrology Continue  renvela Daily renal panel Telemetry  Possible HCAP Reported productive cough, WBC 18 K on admission BC x2 pending Negative COVID and flu Chest x-ray with no acute changes Continue empiric cefepime, vancomycin for now, plan to de-escalate Inhaler, DuoNebs as needed  Atypical chest pain Currently chest pain-free Likely 2/2 volume overload Vs ??PNA Mildly elevated troponin with flat trend EKG with no acute ST changes Telemetry  Hypertensive crisis BP was 243/104 on presentation, still elevated Hopefully will trend down after HD Continue p.o. labetalol, IV as needed  Diabetes mellitus type 2, uncontrolled with hyperglycemia Neuropathy On admission, CBG 610, no evidence of HHS or DKA Continue SSI, lantus, Accu-Cheks, hypoglycemic protocol Continue gabapentin  Anemia of chronic kidney disease Hemoglobin around baseline Daily CBC  Chronic thrombocytopenia Daily CBC      Estimated body mass index is 28.66 kg/m as calculated from the following:   Height as of this encounter: 5\' 7"  (1.702 m).   Weight as of this encounter: 83 kg.     Code Status: Full  Family Communication: None at bedside  Disposition Plan: Status is: Inpatient  Remains inpatient appropriate because:Inpatient level of care appropriate due to severity of illness   Dispo: The patient is from: Home              Anticipated d/c is to: Home              Patient currently is not medically stable to d/c.   Difficult to place patient No    Consultants:  Nephrology  Procedures:  None  Antimicrobials:  Cefepime  Vancomycin  DVT prophylaxis: [place] heparin   Objective: Vitals:   10/26/20 1030 10/26/20 1045 10/26/20 1100 10/26/20 1119  BP: (!) 178/95 (!) 192/93 (!) 200/102 (!) 207/95  Pulse: 91 90 93 93  Resp:    (!) 25  Temp:    (!) 97.5 F (36.4 C)  TempSrc:    Oral  SpO2:    100%  Weight:      Height:        Intake/Output Summary (Last 24 hours) at 10/26/2020 1216 Last data  filed at 10/26/2020 1119 Gross per 24 hour  Intake --  Output 4000 ml  Net -4000 ml   Filed Weights   10/25/20 1952  Weight: 83 kg    Exam:  General: NAD, edentulous  Cardiovascular: S1, S2 present  Respiratory:  Bilateral rales, with wheezing noted  Abdomen: Soft, nontender, nondistended, bowel sounds present  Musculoskeletal: No bilateral pedal edema noted  Skin: Normal  Psychiatry: Normal mood   Data Reviewed: CBC: Recent Labs  Lab 10/25/20 1925 10/26/20 0449  WBC 18.1* 20.6*  NEUTROABS 15.9*  --   HGB 11.8* 10.9*  HCT 34.6* 31.7*  MCV 93.0 92.2  PLT 130* 90*   Basic Metabolic Panel: Recent Labs  Lab 10/25/20 1925 10/26/20 0226  NA 127* 129*  K 6.2* 5.6*  CL 88* 91*  CO2 22 21*  GLUCOSE 610* 354*  BUN 82* 85*  CREATININE 10.55* 11.02*  CALCIUM 7.7* 7.7*   GFR: Estimated Creatinine Clearance: 4.4 mL/min (A) (by C-G formula based on SCr of 11.02 mg/dL (H)). Liver Function Tests: Recent Labs  Lab 10/26/20 0226  AST 42*  ALT 26  ALKPHOS 110  BILITOT 0.8  PROT 7.7  ALBUMIN 3.5   No results for input(s): LIPASE, AMYLASE in the last 168 hours. No results for input(s): AMMONIA in the last 168 hours. Coagulation Profile: No results for input(s): INR, PROTIME in the last 168 hours. Cardiac Enzymes: No results for input(s): CKTOTAL, CKMB, CKMBINDEX, TROPONINI in the last 168 hours. BNP (last 3 results) No results for input(s): PROBNP in the last 8760 hours. HbA1C: No results for input(s): HGBA1C in the last 72 hours. CBG: Recent Labs  Lab 10/25/20 2237 10/26/20 0058  GLUCAP 507* 333*   Lipid Profile: No results for input(s): CHOL, HDL, LDLCALC, TRIG, CHOLHDL, LDLDIRECT in the last 72 hours. Thyroid Function Tests: No results for input(s): TSH, T4TOTAL, FREET4, T3FREE, THYROIDAB in the last 72 hours. Anemia Panel: No results for input(s): VITAMINB12, FOLATE, FERRITIN, TIBC, IRON, RETICCTPCT in the last 72 hours. Urine analysis:     Component Value Date/Time   COLORURINE YELLOW 10/15/2017 2244   APPEARANCEUR HAZY (A) 10/15/2017 2244   LABSPEC 1.015 10/15/2017 2244   PHURINE 7.0 10/15/2017 2244   GLUCOSEU >=500 (A) 10/15/2017 2244   HGBUR SMALL (A) 10/15/2017 2244   BILIRUBINUR NEGATIVE 10/15/2017 2244   KETONESUR NEGATIVE 10/15/2017 2244   PROTEINUR 100 (A) 10/15/2017 2244   UROBILINOGEN 0.2 11/19/2014 0909   NITRITE NEGATIVE 10/15/2017 2244   LEUKOCYTESUR MODERATE (A) 10/15/2017 2244   Sepsis Labs: @LABRCNTIP (procalcitonin:4,lacticidven:4)  ) Recent Results (from the past 240 hour(s))  Resp Panel by RT-PCR (Flu A&B, Covid) Nasopharyngeal Swab     Status: None   Collection Time: 10/25/20  7:26 PM   Specimen: Nasopharyngeal Swab; Nasopharyngeal(NP) swabs in vial transport medium  Result Value Ref Range Status   SARS Coronavirus 2 by RT PCR NEGATIVE NEGATIVE Final    Comment: (NOTE) SARS-CoV-2 target  nucleic acids are NOT DETECTED.  The SARS-CoV-2 RNA is generally detectable in upper respiratory specimens during the acute phase of infection. The lowest concentration of SARS-CoV-2 viral copies this assay can detect is 138 copies/mL. A negative result does not preclude SARS-Cov-2 infection and should not be used as the sole basis for treatment or other patient management decisions. A negative result may occur with  improper specimen collection/handling, submission of specimen other than nasopharyngeal swab, presence of viral mutation(s) within the areas targeted by this assay, and inadequate number of viral copies(<138 copies/mL). A negative result must be combined with clinical observations, patient history, and epidemiological information. The expected result is Negative.  Fact Sheet for Patients:  EntrepreneurPulse.com.au  Fact Sheet for Healthcare Providers:  IncredibleEmployment.be  This test is no t yet approved or cleared by the [place] FDA and  has  been authorized for detection and/or diagnosis of SARS-CoV-2 by FDA under an Emergency Use Authorization (EUA). This EUA will remain  in effect (meaning this test can be used) for the duration of the COVID-19 declaration under Section 564(b)(1) of the Act, 21 U.S.C.section 360bbb-3(b)(1), unless the authorization is terminated  or revoked sooner.       Influenza A by PCR NEGATIVE NEGATIVE Final   Influenza B by PCR NEGATIVE NEGATIVE Final    Comment: (NOTE) The Xpert Xpress SARS-CoV-2/FLU/RSV plus assay is intended as an aid in the diagnosis of influenza from Nasopharyngeal swab specimens and should not be used as a sole basis for treatment. Nasal washings and aspirates are unacceptable for Xpert Xpress SARS-CoV-2/FLU/RSV testing.  Fact Sheet for Patients: EntrepreneurPulse.com.au  Fact Sheet for Healthcare Providers: IncredibleEmployment.be  This test is not yet approved or cleared by the [place] FDA and has been authorized for detection and/or diagnosis of SARS-CoV-2 by FDA under an Emergency Use Authorization (EUA). This EUA will remain in effect (meaning this test can be used) for the duration of the COVID-19 declaration under Section 564(b)(1) of the Act, 21 U.S.C. section 360bbb-3(b)(1), unless the authorization is terminated or revoked.  Performed at [facility], [place] [address] [place], [place] [address]       Studies: DG Chest Portable 1 View  Result Date: 10/25/2020 CLINICAL DATA:  Shortness of breath and increasing right-sided chest pain EXAM: PORTABLE CHEST 1 VIEW COMPARISON:  09/20/2020 FINDINGS: Cardiac shadow is enlarged. Loop recorder is again seen. Lungs are well aerated without focal infiltrate or sizable effusion. No bony abnormality is seen. Stenting is noted in the right arm. IMPRESSION: No acute abnormality noted. Electronically Signed   By: Inez Catalina M.D.   On: 10/25/2020 19:48    Scheduled Meds: .  aspirin  162 mg Oral Daily  . Chlorhexidine Gluconate Cloth  6 each Topical Q0600  . gabapentin  100 mg Oral TID  . insulin aspart  0-6 Units Subcutaneous TID WC  . insulin glargine  7 Units Subcutaneous QHS  . labetalol  100 mg Oral BID  . multivitamin  1 tablet Oral QHS  . pentoxifylline  400 mg Oral Q breakfast  . sevelamer carbonate  800 mg Oral TID WC    Continuous Infusions: . ceFEPime (MAXIPIME) IV Stopped (10/26/20 1114)  . vancomycin Stopped (10/26/20 1043)     LOS: 0 days     Alma Friendly, MD [facility]  If 7PM-7AM, please contact night-coverage [email or web address] 10/26/2020, 12:16 PM

## 2020-10-26 NOTE — Procedures (Signed)
Patient was seen on dialysis and the procedure was supervised.  BFR 400  Via AVF BP is  192/93.   Patient appears to be tolerating treatment well-- has had nearly 4 liters pulled off and she feels much better   Louis Meckel 10/26/2020

## 2020-10-27 ENCOUNTER — Inpatient Hospital Stay (HOSPITAL_COMMUNITY): Payer: Medicare Other

## 2020-10-27 DIAGNOSIS — J9601 Acute respiratory failure with hypoxia: Secondary | ICD-10-CM | POA: Diagnosis not present

## 2020-10-27 DIAGNOSIS — E785 Hyperlipidemia, unspecified: Secondary | ICD-10-CM

## 2020-10-27 DIAGNOSIS — R778 Other specified abnormalities of plasma proteins: Secondary | ICD-10-CM | POA: Diagnosis not present

## 2020-10-27 DIAGNOSIS — N186 End stage renal disease: Secondary | ICD-10-CM | POA: Diagnosis not present

## 2020-10-27 DIAGNOSIS — E118 Type 2 diabetes mellitus with unspecified complications: Secondary | ICD-10-CM | POA: Diagnosis not present

## 2020-10-27 LAB — RENAL FUNCTION PANEL
Albumin: 2.7 g/dL — ABNORMAL LOW (ref 3.5–5.0)
Anion gap: 16 — ABNORMAL HIGH (ref 5–15)
BUN: 43 mg/dL — ABNORMAL HIGH (ref 8–23)
CO2: 23 mmol/L (ref 22–32)
Calcium: 7.8 mg/dL — ABNORMAL LOW (ref 8.9–10.3)
Chloride: 94 mmol/L — ABNORMAL LOW (ref 98–111)
Creatinine, Ser: 7.15 mg/dL — ABNORMAL HIGH (ref 0.44–1.00)
GFR, Estimated: 5 mL/min — ABNORMAL LOW (ref >60)
Glucose, Bld: 146 mg/dL — ABNORMAL HIGH (ref 70–99)
Phosphorus: 4.7 mg/dL — ABNORMAL HIGH (ref 2.5–4.6)
Potassium: 4.5 mmol/L (ref 3.5–5.1)
Sodium: 133 mmol/L — ABNORMAL LOW (ref 135–145)

## 2020-10-27 LAB — GLUCOSE, CAPILLARY
Glucose-Capillary: 150 mg/dL — ABNORMAL HIGH (ref 70–99)
Glucose-Capillary: 253 mg/dL — ABNORMAL HIGH (ref 70–99)
Glucose-Capillary: 131 mg/dL — ABNORMAL HIGH (ref 70–99)
Glucose-Capillary: 145 mg/dL — ABNORMAL HIGH (ref 70–99)

## 2020-10-27 LAB — CBC WITH DIFFERENTIAL/PLATELET
Abs Immature Granulocytes: 0.17 10*3/uL — ABNORMAL HIGH (ref 0.00–0.07)
Basophils Absolute: 0.1 10*3/uL (ref 0.0–0.1)
Basophils Relative: 0 %
Eosinophils Absolute: 0.1 10*3/uL (ref 0.0–0.5)
Eosinophils Relative: 0 %
HCT: 32.4 % — ABNORMAL LOW (ref 36.0–46.0)
Hemoglobin: 11.1 g/dL — ABNORMAL LOW (ref 12.0–15.0)
Immature Granulocytes: 1 %
Lymphocytes Relative: 4 %
Lymphs Abs: 0.9 10*3/uL (ref 0.7–4.0)
MCH: 31.3 pg (ref 26.0–34.0)
MCHC: 34.3 g/dL (ref 30.0–36.0)
MCV: 91.3 fL (ref 80.0–100.0)
Monocytes Absolute: 2.3 10*3/uL — ABNORMAL HIGH (ref 0.1–1.0)
Monocytes Relative: 9 %
Neutro Abs: 21 10*3/uL — ABNORMAL HIGH (ref 1.7–7.7)
Neutrophils Relative %: 86 %
Platelets: 104 10*3/uL — ABNORMAL LOW (ref 150–400)
RBC: 3.55 MIL/uL — ABNORMAL LOW (ref 3.87–5.11)
RDW: 14 % (ref 11.5–15.5)
WBC: 24.5 10*3/uL — ABNORMAL HIGH (ref 4.0–10.5)
nRBC: 0 % (ref 0.0–0.2)

## 2020-10-27 MED ORDER — ALBUTEROL SULFATE (2.5 MG/3ML) 0.083% IN NEBU: 2.5000 mg | INHALATION_SOLUTION | RESPIRATORY_TRACT | Status: DC | PRN

## 2020-10-27 MED ORDER — CHLORHEXIDINE GLUCONATE CLOTH 2 % EX PADS
6.0000 | MEDICATED_PAD | Freq: Every day | CUTANEOUS | Status: DC
  Administered 2020-10-28: 6 via TOPICAL

## 2020-10-27 MED ORDER — VANCOMYCIN HCL 500 MG/100ML IV SOLN
500.0000 mg | Freq: Once | INTRAVENOUS | Status: AC
  Administered 2020-10-27: 500 mg via INTRAVENOUS
  Filled 2020-10-27: qty 100

## 2020-10-27 MED ORDER — IPRATROPIUM-ALBUTEROL 0.5-2.5 (3) MG/3ML IN SOLN
3.0000 mL | RESPIRATORY_TRACT | Status: DC | PRN
  Administered 2020-10-27 – 2020-10-29 (×2): 3 mL via RESPIRATORY_TRACT
  Filled 2020-10-27 (×2): qty 3

## 2020-10-27 NOTE — Progress Notes (Signed)
Subjective:  VS stable-  On 1 liters [place]-- removed 4 liters with HD yesterday.  She says she is having a very hard time catching her breath this AM-  Wheezing   Objective Vital signs in last 24 hours: Vitals:   10/26/20 1639 10/26/20 2035 10/27/20 0542 10/27/20 0959  BP: (!) 130/59 (!) 153/60 (!) 145/66 139/60  Pulse: 81 86 82 83  Resp: 17 18 (!) 22 18  Temp: 98.9 F (37.2 C) 99.6 F (37.6 C) 98.5 F (36.9 C) 98.8 F (37.1 C)  TempSrc: Oral Oral Oral Oral  SpO2: 98% 95% 94% 97%  Weight:      Height:       Weight change:   Intake/Output Summary (Last 24 hours) at 10/27/2020 1042 Last data filed at 10/27/2020 0900 Gross per 24 hour  Intake 532.03 ml  Output 4000 ml  Net -3467.97 ml    Dialysis Orders:  MWF-[facility] in [place]  4hrs, BFR 400, DFR 500,  EDW 81kg, 2K/ 2.5Ca  Access: RUE AVF Heparin: none Epogen 2000 units IV push 3x qw Hectorol 27mcg IV 3x qw Home meds: Renvela 800mg  3 tabs po tid with meals and 2 with snacks  Last Labs: Hgb 10.9, K 5.6, Ca 7.7, Alb 3.5  Assessment/Plan: 1.  Possible HCAP: WBC elevated on admission-now 20.6. BC cultures X 2 pending. COVID and Flu (-). Noted patient with expiratory wheezing. CXR showed no acute abnormalities. Continue ABX (maxepime and vanc) and DuoNebs 2.  ESRD -  On HD MWF- HD yest morning- felt better but now more SOB.  Will do breathing treatment but also will do extra treatment today for 2.5 hours mostly focused on volume.  Then plan will be regular treatment in MOnday 3.  Hypertension/volume  - Noted high Bps sys 200s-BP now improved after HD, more likley in the setting of volume overload. Better 4.  Anemia of CKD - Hgb slowly trending downward-was 11.8 at admission now 11.1-no need for ESA yet 5.  Secondary Hyperparathyroidism -  CorrCa 8.1- will resume Hectorol. Will also order PO4 level at next lab draw and continue renvela. 6.  Nutrition - Continue renal diet with fluid restriction 7. Hyperkalemia-  Was given one  dose of lokelma-  Then had HD to correct-   to monitor     Gate: Basic Metabolic Panel: Recent Labs  Lab 10/25/20 1925 10/26/20 0226 10/27/20 0334  NA 127* 129* 133*  K 6.2* 5.6* 4.5  CL 88* 91* 94*  CO2 22 21* 23  GLUCOSE 610* 354* 146*  BUN 82* 85* 43*  CREATININE 10.55* 11.02* 7.15*  CALCIUM 7.7* 7.7* 7.8*  PHOS  --   --  4.7*   Liver Function Tests: Recent Labs  Lab 10/26/20 0226 10/27/20 0334  AST 42*  --   ALT 26  --   ALKPHOS 110  --   BILITOT 0.8  --   PROT 7.7  --   ALBUMIN 3.5 2.7*   No results for input(s): LIPASE, AMYLASE in the last 168 hours. No results for input(s): AMMONIA in the last 168 hours. CBC: Recent Labs  Lab 10/25/20 1925 10/26/20 0449 10/27/20 0334  WBC 18.1* 20.6* 24.5*  NEUTROABS 15.9*  --  21.0*  HGB 11.8* 10.9* 11.1*  HCT 34.6* 31.7* 32.4*  MCV 93.0 92.2 91.3  PLT 130* 90* 104*   Cardiac Enzymes: No results for input(s): CKTOTAL, CKMB, CKMBINDEX, TROPONINI in the last 168 hours. CBG: Recent Labs  Lab  10/25/20 2237 10/26/20 0058 10/26/20 1342 10/26/20 1642 10/27/20 0657  GLUCAP 507* 333* 215* 140* 150*    Iron Studies: No results for input(s): IRON, TIBC, TRANSFERRIN, FERRITIN in the last 72 hours. Studies/Results: DG Chest Port 1 View  Result Date: 10/27/2020 CLINICAL DATA:  Dyspnea EXAM: PORTABLE CHEST - 1 VIEW COMPARISON:  10/25/2020 FINDINGS: Increasing coarse airspace opacities in the left mid and lower lung. There are some very mild patchy opacities in the right lower lung. Stable cardiomegaly. No effusion.  No pneumothorax. Visualized bones unremarkable. IMPRESSION: 1. Developing asymmetric scattered airspace opacities, left greater than right. Electronically Signed   By: Lucrezia Europe M.D.   On: 10/27/2020 10:39   DG Chest Portable 1 View  Result Date: 10/25/2020 CLINICAL DATA:  Shortness of breath and increasing right-sided chest pain EXAM: PORTABLE CHEST 1 VIEW COMPARISON:  09/20/2020  FINDINGS: Cardiac shadow is enlarged. Loop recorder is again seen. Lungs are well aerated without focal infiltrate or sizable effusion. No bony abnormality is seen. Stenting is noted in the right arm. IMPRESSION: No acute abnormality noted. Electronically Signed   By: Inez Catalina M.D.   On: 10/25/2020 19:48   Medications: Infusions: . [START ON 10/29/2020] ceFEPime (MAXIPIME) IV    . vancomycin Stopped (10/26/20 1042)    Scheduled Medications: . aspirin  162 mg Oral Daily  . Chlorhexidine Gluconate Cloth  6 each Topical Q0600  . [START ON 10/29/2020] doxercalciferol  2 mcg Intravenous Q M,W,F-HD  . heparin injection (subcutaneous)  5,000 Units Subcutaneous Q8H  . insulin aspart  0-6 Units Subcutaneous TID WC  . insulin glargine  7 Units Subcutaneous QHS  . labetalol  100 mg Oral TID  . multivitamin  1 tablet Oral QHS  . pentoxifylline  400 mg Oral Q breakfast  . sevelamer carbonate  800 mg Oral TID WC    have reviewed scheduled and prn medications.  Physical Exam: General:  Obviously having trouble with SOB Heart: RRR Lungs: wheezing.... rapid resp rate Abdomen: soft, non tender Extremities: minimal edema Dialysis Access: right AVF- patent    10/27/2020,10:42 AM  LOS: 1 day

## 2020-10-27 NOTE — Plan of Care (Signed)
  Problem: Health Behavior/Discharge Planning: Goal: Ability to manage health-related needs will improve Outcome: Progressing   Problem: Clinical Measurements: Goal: Diagnostic test results will improve Outcome: Progressing   Problem: Coping: Goal: Level of anxiety will decrease Outcome: Progressing   Problem: Safety: Goal: Ability to remain free from injury will improve Outcome: Progressing   Problem: Skin Integrity: Goal: Risk for impaired skin integrity will decrease Outcome: Progressing

## 2020-10-27 NOTE — Progress Notes (Signed)
PROGRESS NOTE  [name]:[id number] DOB: 05/31/1941 DOA: 10/25/2020 PCP: [name], MD  HPI/Recap of past 24 hours: HPI from Dr Wilnette Kales is a 80 y.o. female with medical history significant of ESRD on HD MWF, HTN, T2DM, stroke, who presented with chest pain and shortness of breath.  Patient is a poor historian.  The HPI was obtained by chart review, talking to patient and ED staff. Patient reports that she has had right-sided chest pain "for a while", unable to explain further. Pt was evaluated for right rib pain at the ED on 09/20/20. Chest CT W/O contrast suggested "Minimal right pleural effusion" and " small sliding style hiatal hernia".  Patient also reports increased shortness of breath and productive cough of brownish sputum for a few days. She apparently missed her last HD session. In the ED, pt was afebrile with pulse 100s, RR 22, BP 243/104 and O2 sat 90% on 2 LNC. Labs showed Na 127, K 6.2, BG 610, BUN 82, Cr 10.55, troponin 34, WBC 18.1, negative COVID and flu.  EKG showed sinus tachycardia. CXR showed no acute changes.  Patient admitted for further management.     Today, patient reported feeling more short of breath, still with cough production of yellowish sputum, denies any chest pain, fever/chills, abdominal pain, nausea/vomiting.  Discussed with daughter at bedside.    Assessment/Plan: Principal Problem:   Acute respiratory failure with hypoxia (HCC) Active Problems:   HLD (hyperlipidemia)   Hypertension associated with diabetes ([place])   HCAP (healthcare-associated pneumonia)   Hyperkalemia   Diabetes mellitus with complication (HCC)   ESRD (end stage renal disease) on dialysis [facility])   History of CVA (cerebrovascular accident)   Elevated troponin   Volume overload   Hyperglycemia   Hypertensive urgency   Likely volume overload due to missed HD ESRD on HD MWF Hyperkalemia, hyponatremia Nephrology consulted, had HD on 10/26/2020, due to  persistent SOB, had another session on 10/27/20 Continue renvela Daily renal panel Telemetry  Possible HCAP Reported productive cough, WBC 18 K on admission BC x2 pending Negative COVID and flu Chest x-ray with no acute changes Continue empiric cefepime, vancomycin for now, plan to de-escalate Inhaler, DuoNebs as needed  Atypical chest pain Currently chest pain-free Likely 2/2 volume overload Vs ??PNA Mildly elevated troponin with flat trend EKG with no acute ST changes Telemetry  Hypertensive crisis Improving with HD BP was 243/104 on presentation Continue p.o. labetalol (increased to TID), IV as needed  Diabetes mellitus type 2, uncontrolled with hyperglycemia Neuropathy On admission, CBG 610, no evidence of HHS or DKA Continue SSI, lantus, Accu-Cheks, hypoglycemic protocol Continue gabapentin  Anemia of chronic kidney disease Hemoglobin around baseline Daily CBC  Chronic thrombocytopenia Daily CBC      Estimated body mass index is 26.9 kg/m as calculated from the following:   Height as of this encounter: 5\' 7"  (1.702 m).   Weight as of this encounter: 77.9 kg.     Code Status: Full  Family Communication: Discussed with daughter at bedside  Disposition Plan: Status is: Inpatient  Remains inpatient appropriate because:Inpatient level of care appropriate due to severity of illness   Dispo: The patient is from: Home              Anticipated d/c is to: Home              Patient currently is not medically stable to d/c.   Difficult to place patient No    Consultants:  Nephrology  Procedures:  None  Antimicrobials:  Cefepime  Vancomycin  DVT prophylaxis: [place] heparin   Objective: Vitals:   10/27/20 1510 10/27/20 1521 10/27/20 1526 10/27/20 1600  BP: (!) 149/70 (!) 146/70 (!) 143/68 123/62  Pulse: 81 81 80 82  Resp: (!) 27  (!) 23   Temp: 98.7 F (37.1 C)     TempSrc: Oral     SpO2: 98%     Weight: 77.9 kg     Height:         Intake/Output Summary (Last 24 hours) at 10/27/2020 1608 Last data filed at 10/27/2020 0900 Gross per 24 hour  Intake 180 ml  Output 0 ml  Net 180 ml   Filed Weights   10/25/20 1952 10/27/20 1510  Weight: 83 kg 77.9 kg    Exam:  General: NAD, edentulous  Cardiovascular: S1, S2 present  Respiratory:  Bilateral rales, with wheezing noted  Abdomen: Soft, nontender, nondistended, bowel sounds present  Musculoskeletal: No bilateral pedal edema noted  Skin: Normal  Psychiatry: Normal mood   Data Reviewed: CBC: Recent Labs  Lab 10/25/20 1925 10/26/20 0449 10/27/20 0334  WBC 18.1* 20.6* 24.5*  NEUTROABS 15.9*  --  21.0*  HGB 11.8* 10.9* 11.1*  HCT 34.6* 31.7* 32.4*  MCV 93.0 92.2 91.3  PLT 130* 90* 563*   Basic Metabolic Panel: Recent Labs  Lab 10/25/20 1925 10/26/20 0226 10/27/20 0334  NA 127* 129* 133*  K 6.2* 5.6* 4.5  CL 88* 91* 94*  CO2 22 21* 23  GLUCOSE 610* 354* 146*  BUN 82* 85* 43*  CREATININE 10.55* 11.02* 7.15*  CALCIUM 7.7* 7.7* 7.8*  PHOS  --   --  4.7*   GFR: Estimated Creatinine Clearance: 6.6 mL/min (A) (by C-G formula based on SCr of 7.15 mg/dL (H)). Liver Function Tests: Recent Labs  Lab 10/26/20 0226 10/27/20 0334  AST 42*  --   ALT 26  --   ALKPHOS 110  --   BILITOT 0.8  --   PROT 7.7  --   ALBUMIN 3.5 2.7*   No results for input(s): LIPASE, AMYLASE in the last 168 hours. No results for input(s): AMMONIA in the last 168 hours. Coagulation Profile: No results for input(s): INR, PROTIME in the last 168 hours. Cardiac Enzymes: No results for input(s): CKTOTAL, CKMB, CKMBINDEX, TROPONINI in the last 168 hours. BNP (last 3 results) No results for input(s): PROBNP in the last 8760 hours. HbA1C: No results for input(s): HGBA1C in the last 72 hours. CBG: Recent Labs  Lab 10/26/20 0058 10/26/20 1342 10/26/20 1642 10/27/20 0657 10/27/20 1134  GLUCAP 333* 215* 140* 150* 253*   Lipid Profile: No results for input(s):  CHOL, HDL, LDLCALC, TRIG, CHOLHDL, LDLDIRECT in the last 72 hours. Thyroid Function Tests: No results for input(s): TSH, T4TOTAL, FREET4, T3FREE, THYROIDAB in the last 72 hours. Anemia Panel: No results for input(s): VITAMINB12, FOLATE, FERRITIN, TIBC, IRON, RETICCTPCT in the last 72 hours. Urine analysis:    Component Value Date/Time   COLORURINE YELLOW 10/15/2017 2244   APPEARANCEUR HAZY (A) 10/15/2017 2244   LABSPEC 1.015 10/15/2017 2244   PHURINE 7.0 10/15/2017 2244   GLUCOSEU >=500 (A) 10/15/2017 2244   HGBUR SMALL (A) 10/15/2017 2244   BILIRUBINUR NEGATIVE 10/15/2017 2244   KETONESUR NEGATIVE 10/15/2017 2244   PROTEINUR 100 (A) 10/15/2017 2244   UROBILINOGEN 0.2 11/19/2014 0909   NITRITE NEGATIVE 10/15/2017 2244   LEUKOCYTESUR MODERATE (A) 10/15/2017 2244   Sepsis Labs: @LABRCNTIP (procalcitonin:4,lacticidven:4)  ) Recent  Results (from the past 240 hour(s))  Resp Panel by RT-PCR (Flu A&B, Covid) Nasopharyngeal Swab     Status: None   Collection Time: 10/25/20  7:26 PM   Specimen: Nasopharyngeal Swab; Nasopharyngeal(NP) swabs in vial transport medium  Result Value Ref Range Status   SARS Coronavirus 2 by RT PCR NEGATIVE NEGATIVE Final    Comment: (NOTE) SARS-CoV-2 target nucleic acids are NOT DETECTED.  The SARS-CoV-2 RNA is generally detectable in upper respiratory specimens during the acute phase of infection. The lowest concentration of SARS-CoV-2 viral copies this assay can detect is 138 copies/mL. A negative result does not preclude SARS-Cov-2 infection and should not be used as the sole basis for treatment or other patient management decisions. A negative result may occur with  improper specimen collection/handling, submission of specimen other than nasopharyngeal swab, presence of viral mutation(s) within the areas targeted by this assay, and inadequate number of viral copies(<138 copies/mL). A negative result must be combined with clinical observations, patient  history, and epidemiological information. The expected result is Negative.  Fact Sheet for Patients:  EntrepreneurPulse.com.au  Fact Sheet for Healthcare Providers:  IncredibleEmployment.be  This test is no t yet approved or cleared by the [place] FDA and  has been authorized for detection and/or diagnosis of SARS-CoV-2 by FDA under an Emergency Use Authorization (EUA). This EUA will remain  in effect (meaning this test can be used) for the duration of the COVID-19 declaration under Section 564(b)(1) of the Act, 21 U.S.C.section 360bbb-3(b)(1), unless the authorization is terminated  or revoked sooner.       Influenza A by PCR NEGATIVE NEGATIVE Final   Influenza B by PCR NEGATIVE NEGATIVE Final    Comment: (NOTE) The Xpert Xpress SARS-CoV-2/FLU/RSV plus assay is intended as an aid in the diagnosis of influenza from Nasopharyngeal swab specimens and should not be used as a sole basis for treatment. Nasal washings and aspirates are unacceptable for Xpert Xpress SARS-CoV-2/FLU/RSV testing.  Fact Sheet for Patients: EntrepreneurPulse.com.au  Fact Sheet for Healthcare Providers: IncredibleEmployment.be  This test is not yet approved or cleared by the [place] FDA and has been authorized for detection and/or diagnosis of SARS-CoV-2 by FDA under an Emergency Use Authorization (EUA). This EUA will remain in effect (meaning this test can be used) for the duration of the COVID-19 declaration under Section 564(b)(1) of the Act, 21 U.S.C. section 360bbb-3(b)(1), unless the authorization is terminated or revoked.  Performed at [facility], [place] [address], [place], [place] [address]   Culture, blood (Routine X 2) w Reflex to ID Panel     Status: None (Preliminary result)   Collection Time: 10/26/20  4:49 AM   Specimen: BLOOD RIGHT FOREARM  Result Value Ref Range Status   Specimen Description  BLOOD RIGHT FOREARM  Final   Special Requests   Final    BOTTLES DRAWN AEROBIC AND ANAEROBIC Blood Culture adequate volume   Culture   Final    NO GROWTH 1 DAY Performed at [facility], [place] [address], [place], [place] [address]    Report Status PENDING  Incomplete  Culture, blood (Routine X 2) w Reflex to ID Panel     Status: None (Preliminary result)   Collection Time: 10/26/20  4:54 AM   Specimen: BLOOD RIGHT HAND  Result Value Ref Range Status   Specimen Description BLOOD RIGHT HAND  Final   Special Requests   Final    BOTTLES DRAWN AEROBIC AND ANAEROBIC Blood Culture adequate  volume   Culture   Final    NO GROWTH 1 DAY Performed at [facility], [place] [address], [place], [place] [address]    Report Status PENDING  Incomplete      Studies: DG Chest Port 1 View  Result Date: 10/27/2020 CLINICAL DATA:  Dyspnea EXAM: PORTABLE CHEST - 1 VIEW COMPARISON:  10/25/2020 FINDINGS: Increasing coarse airspace opacities in the left mid and lower lung. There are some very mild patchy opacities in the right lower lung. Stable cardiomegaly. No effusion.  No pneumothorax. Visualized bones unremarkable. IMPRESSION: 1. Developing asymmetric scattered airspace opacities, left greater than right. Electronically Signed   By: Lucrezia Europe M.D.   On: 10/27/2020 10:39    Scheduled Meds: . aspirin  162 mg Oral Daily  . Chlorhexidine Gluconate Cloth  6 each Topical Q0600  . Chlorhexidine Gluconate Cloth  6 each Topical Q0600  . [START ON 10/29/2020] doxercalciferol  2 mcg Intravenous Q M,W,F-HD  . heparin injection (subcutaneous)  5,000 Units Subcutaneous Q8H  . insulin aspart  0-6 Units Subcutaneous TID WC  . insulin glargine  7 Units Subcutaneous QHS  . labetalol  100 mg Oral TID  . multivitamin  1 tablet Oral QHS  . pentoxifylline  400 mg Oral Q breakfast  . sevelamer carbonate  800 mg Oral TID WC    Continuous Infusions: . [START ON 10/29/2020] ceFEPime (MAXIPIME) IV    . vancomycin  Stopped (10/26/20 1042)  . vancomycin       LOS: 1 day     Alma Friendly, MD [facility]  If 7PM-7AM, please contact night-coverage [email or web address] 10/27/2020, 4:08 PM

## 2020-10-27 NOTE — Progress Notes (Addendum)
Pharmacy Antibiotic Note  [name] is a 80 y.o. female admitted on 10/25/2020 with pneumonia.  Pharmacy has been consulted for Vancomycin and Cefepime dosing. 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  Nephro planning extra 2.5 hr HD 5/14 for volume, will give vanc 500mg  x1.   Plan: Give vancomycin 500mg  x1 for extra 2.5hr HD today Cefepime 2gm IV now and qHD MWF Vancomycin 750mg  mg IV Q HD MWF   Will f/u HD schedule, micro data, and pt's clinical condition F/u MRSA screen Order vanc level 5/16 AM if continuing    Height: 5\' 7"  (170.2 cm) Weight:  (on ED stretcher) IBW/kg (Calculated) : 61.6  Temp (24hrs), Avg:99 F (37.2 C), Min:98.5 F (36.9 C), Max:99.6 F (37.6 C)  Recent Labs  Lab 10/25/20 1925 10/26/20 0226 10/26/20 0449 10/27/20 0334  WBC 18.1*  --  20.6* 24.5*  CREATININE 10.55* 11.02*  --  7.15*    Estimated Creatinine Clearance: 6.8 mL/min (A) (by C-G formula based on SCr of 7.15 mg/dL (H)).    Allergies  Allergen Reactions  . Ambien [Zolpidem] Other (See Comments)    Hallucinations   . Reglan [Metoclopramide] Other (See Comments)    "makes me crazy"    Antimicrobials this admission: 5/13 Vanc >>  5/13 Cefepime >>   Microbiology results: 5/13 BCx pending    Thank you for allowing pharmacy to be a part of this patient's care.  Benetta Spar, PharmD, BCPS, BCCP Clinical Pharmacist  Please check AMION for all [facility] phone numbers After 10:00 PM, call [place] [phone]

## 2020-10-28 DIAGNOSIS — N186 End stage renal disease: Secondary | ICD-10-CM | POA: Diagnosis not present

## 2020-10-28 DIAGNOSIS — R778 Other specified abnormalities of plasma proteins: Secondary | ICD-10-CM | POA: Diagnosis not present

## 2020-10-28 DIAGNOSIS — E118 Type 2 diabetes mellitus with unspecified complications: Secondary | ICD-10-CM | POA: Diagnosis not present

## 2020-10-28 DIAGNOSIS — J9601 Acute respiratory failure with hypoxia: Secondary | ICD-10-CM | POA: Diagnosis not present

## 2020-10-28 LAB — CBC WITH DIFFERENTIAL/PLATELET
Abs Immature Granulocytes: 0.17 10*3/uL — ABNORMAL HIGH (ref 0.00–0.07)
Basophils Absolute: 0 10*3/uL (ref 0.0–0.1)
Basophils Relative: 0 %
Eosinophils Absolute: 0 10*3/uL (ref 0.0–0.5)
Eosinophils Relative: 0 %
HCT: 31.5 % — ABNORMAL LOW (ref 36.0–46.0)
Hemoglobin: 11.2 g/dL — ABNORMAL LOW (ref 12.0–15.0)
Immature Granulocytes: 1 %
Lymphocytes Relative: 5 %
Lymphs Abs: 1.1 10*3/uL (ref 0.7–4.0)
MCH: 31.6 pg (ref 26.0–34.0)
MCHC: 35.6 g/dL (ref 30.0–36.0)
MCV: 89 fL (ref 80.0–100.0)
Monocytes Absolute: 2 10*3/uL — ABNORMAL HIGH (ref 0.1–1.0)
Monocytes Relative: 9 %
Neutro Abs: 17.4 10*3/uL — ABNORMAL HIGH (ref 1.7–7.7)
Neutrophils Relative %: 85 %
Platelets: 124 10*3/uL — ABNORMAL LOW (ref 150–400)
RBC: 3.54 MIL/uL — ABNORMAL LOW (ref 3.87–5.11)
RDW: 14.1 % (ref 11.5–15.5)
WBC: 20.7 10*3/uL — ABNORMAL HIGH (ref 4.0–10.5)
nRBC: 0 % (ref 0.0–0.2)

## 2020-10-28 LAB — GLUCOSE, CAPILLARY
Glucose-Capillary: 132 mg/dL — ABNORMAL HIGH (ref 70–99)
Glucose-Capillary: 132 mg/dL — ABNORMAL HIGH (ref 70–99)
Glucose-Capillary: 179 mg/dL — ABNORMAL HIGH (ref 70–99)
Glucose-Capillary: 241 mg/dL — ABNORMAL HIGH (ref 70–99)
Glucose-Capillary: 239 mg/dL — ABNORMAL HIGH (ref 70–99)

## 2020-10-28 LAB — RENAL FUNCTION PANEL
Albumin: 2.5 g/dL — ABNORMAL LOW (ref 3.5–5.0)
Anion gap: 14 (ref 5–15)
BUN: 36 mg/dL — ABNORMAL HIGH (ref 8–23)
CO2: 28 mmol/L (ref 22–32)
Calcium: 8.1 mg/dL — ABNORMAL LOW (ref 8.9–10.3)
Chloride: 92 mmol/L — ABNORMAL LOW (ref 98–111)
Creatinine, Ser: 6.17 mg/dL — ABNORMAL HIGH (ref 0.44–1.00)
GFR, Estimated: 6 mL/min — ABNORMAL LOW (ref >60)
Glucose, Bld: 141 mg/dL — ABNORMAL HIGH (ref 70–99)
Phosphorus: 3.7 mg/dL (ref 2.5–4.6)
Potassium: 4 mmol/L (ref 3.5–5.1)
Sodium: 134 mmol/L — ABNORMAL LOW (ref 135–145)

## 2020-10-28 LAB — MRSA PCR SCREENING: MRSA by PCR: NEGATIVE

## 2020-10-28 MED ORDER — CHLORHEXIDINE GLUCONATE CLOTH 2 % EX PADS
6.0000 | MEDICATED_PAD | Freq: Every day | CUTANEOUS | Status: DC
  Administered 2020-10-28 – 2020-10-30 (×2): 6 via TOPICAL

## 2020-10-28 MED ORDER — VANCOMYCIN HCL 750 MG/150ML IV SOLN
750.0000 mg | INTRAVENOUS | Status: DC
  Filled 2020-10-28: qty 150

## 2020-10-28 MED ORDER — VANCOMYCIN HCL IN DEXTROSE 750-5 MG/150ML-% IV SOLN
750.0000 mg | INTRAVENOUS | Status: DC
  Filled 2020-10-28: qty 150

## 2020-10-28 MED ORDER — IPRATROPIUM-ALBUTEROL 0.5-2.5 (3) MG/3ML IN SOLN: 3.0000 mL | Freq: Four times a day (QID) | RESPIRATORY_TRACT | Status: DC

## 2020-10-28 NOTE — Progress Notes (Signed)
PROGRESS NOTE  [name] BHA:[id number] DOB: [birth year] DOA: 10/25/2020 PCP: [name], MD  HPI/Recap of past 24 hours: HPI from Dr Wilnette Kales is a 80 y.o. female with medical history significant of ESRD on HD MWF, HTN, T2DM, stroke, who presented with chest pain and shortness of breath.  Patient is a poor historian.  The HPI was obtained by chart review, talking to patient and ED staff. Patient reports that she has had right-sided chest pain "for a while", unable to explain further. Pt was evaluated for right rib pain at the ED on 09/20/20. Chest CT W/O contrast suggested "Minimal right pleural effusion" and " small sliding style hiatal hernia".  Patient also reports increased shortness of breath and productive cough of brownish sputum for a few days. She apparently missed her last HD session. In the ED, pt was afebrile with pulse 100s, RR 22, BP 243/104 and O2 sat 90% on 2 LNC. Labs showed Na 127, K 6.2, BG 610, BUN 82, Cr 10.55, troponin 34, WBC 18.1, negative COVID and flu.  EKG showed sinus tachycardia. CXR showed no acute changes.  Patient admitted for further management.     Today, patient still coughing, short of breath with some mild wheezing.  Denies any chest pain, abdominal pain, N/V, fever/chills   Assessment/Plan: Principal Problem:   Acute respiratory failure with hypoxia (HCC) Active Problems:   HLD (hyperlipidemia)   Hypertension associated with diabetes ([place])   HCAP (healthcare-associated pneumonia)   Hyperkalemia   Diabetes mellitus with complication (HCC)   ESRD (end stage renal disease) on dialysis [facility])   History of CVA (cerebrovascular accident)   Elevated troponin   Volume overload   Hyperglycemia   Hypertensive urgency   Likely volume overload due to missed HD ESRD on HD MWF Hyperkalemia, hyponatremia Nephrology consulted, had HD on 10/26/2020, due to persistent SOB, had another session on 10/27/20 Continue renvela Daily renal  panel Telemetry  Possible HCAP Reported productive cough, WBC 18 K on admission BC x2 pending Negative COVID and flu Chest x-ray with no acute changes, repeat with developing opacities Continue empiric cefepime, vancomycin for now, plan to de-escalate Inhaler, DuoNebs as needed  Atypical chest pain Currently chest pain-free Likely 2/2 volume overload Vs ??PNA Mildly elevated troponin with flat trend EKG with no acute ST changes Telemetry  Hypertensive crisis Improving with HD BP was 243/104 on presentation Continue p.o. labetalol (increased to TID), IV as needed  Diabetes mellitus type 2, uncontrolled with hyperglycemia Neuropathy On admission, CBG 610, no evidence of HHS or DKA Continue SSI, lantus, Accu-Cheks, hypoglycemic protocol Continue gabapentin  Anemia of chronic kidney disease Hemoglobin around baseline Daily CBC  Chronic thrombocytopenia Daily CBC      Estimated body mass index is 25.97 kg/m as calculated from the following:   Height as of this encounter: 5\' 7"  (1.702 m).   Weight as of this encounter: 75.2 kg.     Code Status: Full  Family Communication: Discussed with daughter at bedside  Disposition Plan: Status is: Inpatient  Remains inpatient appropriate because:Inpatient level of care appropriate due to severity of illness   Dispo: The patient is from: Home              Anticipated d/c is to: Home              Patient currently is not medically stable to d/c.   Difficult to place patient No    Consultants:  Nephrology  Procedures:  None  Antimicrobials:  Cefepime  Vancomycin  DVT prophylaxis: [place] heparin   Objective: Vitals:   10/27/20 1829 10/27/20 1936 10/28/20 0413 10/28/20 0929  BP: (!) 139/58 (!) 107/55 (!) 113/54 (!) 130/55  Pulse: 87 82 80 79  Resp: (!) 21 18 18 18   Temp: 97.9 F (36.6 C) 99.1 F (37.3 C) 98.8 F (37.1 C) 98.4 F (36.9 C)  TempSrc: Oral Oral Oral Oral  SpO2: 91% 96% 92% 90%  Weight:       Height:        Intake/Output Summary (Last 24 hours) at 10/28/2020 1457 Last data filed at 10/28/2020 0900 Gross per 24 hour  Intake 360 ml  Output 2512 ml  Net -2152 ml   Filed Weights   10/25/20 1952 10/27/20 1510 10/27/20 1752  Weight: 83 kg 77.9 kg 75.2 kg    Exam:  General: NAD, edentulous  Cardiovascular: S1, S2 present  Respiratory:  Bilateral rales, with wheezing noted  Abdomen: Soft, nontender, nondistended, bowel sounds present  Musculoskeletal: No bilateral pedal edema noted  Skin: Normal  Psychiatry: Normal mood   Data Reviewed: CBC: Recent Labs  Lab 10/25/20 1925 10/26/20 0449 10/27/20 0334 10/28/20 0436  WBC 18.1* 20.6* 24.5* 20.7*  NEUTROABS 15.9*  --  21.0* 17.4*  HGB 11.8* 10.9* 11.1* 11.2*  HCT 34.6* 31.7* 32.4* 31.5*  MCV 93.0 92.2 91.3 89.0  PLT 130* 90* 104* 433*   Basic Metabolic Panel: Recent Labs  Lab 10/25/20 1925 10/26/20 0226 10/27/20 0334 10/28/20 0436  NA 127* 129* 133* 134*  K 6.2* 5.6* 4.5 4.0  CL 88* 91* 94* 92*  CO2 22 21* 23 28  GLUCOSE 610* 354* 146* 141*  BUN 82* 85* 43* 36*  CREATININE 10.55* 11.02* 7.15* 6.17*  CALCIUM 7.7* 7.7* 7.8* 8.1*  PHOS  --   --  4.7* 3.7   GFR: Estimated Creatinine Clearance: 7.6 mL/min (A) (by C-G formula based on SCr of 6.17 mg/dL (H)). Liver Function Tests: Recent Labs  Lab 10/26/20 0226 10/27/20 0334 10/28/20 0436  AST 42*  --   --   ALT 26  --   --   ALKPHOS 110  --   --   BILITOT 0.8  --   --   PROT 7.7  --   --   ALBUMIN 3.5 2.7* 2.5*   No results for input(s): LIPASE, AMYLASE in the last 168 hours. No results for input(s): AMMONIA in the last 168 hours. Coagulation Profile: No results for input(s): INR, PROTIME in the last 168 hours. Cardiac Enzymes: No results for input(s): CKTOTAL, CKMB, CKMBINDEX, TROPONINI in the last 168 hours. BNP (last 3 results) No results for input(s): PROBNP in the last 8760 hours. HbA1C: No results for input(s): HGBA1C in the last  72 hours. CBG: Recent Labs  Lab 10/27/20 1831 10/27/20 2155 10/28/20 0409 10/28/20 0711 10/28/20 1137  GLUCAP 131* 145* 132* 132* 179*   Lipid Profile: No results for input(s): CHOL, HDL, LDLCALC, TRIG, CHOLHDL, LDLDIRECT in the last 72 hours. Thyroid Function Tests: No results for input(s): TSH, T4TOTAL, FREET4, T3FREE, THYROIDAB in the last 72 hours. Anemia Panel: No results for input(s): VITAMINB12, FOLATE, FERRITIN, TIBC, IRON, RETICCTPCT in the last 72 hours. Urine analysis:    Component Value Date/Time   COLORURINE YELLOW 10/15/2017 2244   APPEARANCEUR HAZY (A) 10/15/2017 2244   LABSPEC 1.015 10/15/2017 2244   PHURINE 7.0 10/15/2017 2244   GLUCOSEU >=500 (A) 10/15/2017 2244   HGBUR SMALL (A) 10/15/2017 2244   BILIRUBINUR  NEGATIVE 10/15/2017 2244   KETONESUR NEGATIVE 10/15/2017 2244   PROTEINUR 100 (A) 10/15/2017 2244   UROBILINOGEN 0.2 11/19/2014 0909   NITRITE NEGATIVE 10/15/2017 2244   LEUKOCYTESUR MODERATE (A) 10/15/2017 2244   Sepsis Labs: @LABRCNTIP (procalcitonin:4,lacticidven:4)  ) Recent Results (from the past 240 hour(s))  Resp Panel by RT-PCR (Flu A&B, Covid) Nasopharyngeal Swab     Status: None   Collection Time: 10/25/20  7:26 PM   Specimen: Nasopharyngeal Swab; Nasopharyngeal(NP) swabs in vial transport medium  Result Value Ref Range Status   SARS Coronavirus 2 by RT PCR NEGATIVE NEGATIVE Final    Comment: (NOTE) SARS-CoV-2 target nucleic acids are NOT DETECTED.  The SARS-CoV-2 RNA is generally detectable in upper respiratory specimens during the acute phase of infection. The lowest concentration of SARS-CoV-2 viral copies this assay can detect is 138 copies/mL. A negative result does not preclude SARS-Cov-2 infection and should not be used as the sole basis for treatment or other patient management decisions. A negative result may occur with  improper specimen collection/handling, submission of specimen other than nasopharyngeal swab, presence  of viral mutation(s) within the areas targeted by this assay, and inadequate number of viral copies(<138 copies/mL). A negative result must be combined with clinical observations, patient history, and epidemiological information. The expected result is Negative.  Fact Sheet for Patients:  EntrepreneurPulse.com.au  Fact Sheet for Healthcare Providers:  IncredibleEmployment.be  This test is no t yet approved or cleared by the [place] FDA and  has been authorized for detection and/or diagnosis of SARS-CoV-2 by FDA under an Emergency Use Authorization (EUA). This EUA will remain  in effect (meaning this test can be used) for the duration of the COVID-19 declaration under Section 564(b)(1) of the Act, 21 U.S.C.section 360bbb-3(b)(1), unless the authorization is terminated  or revoked sooner.       Influenza A by PCR NEGATIVE NEGATIVE Final   Influenza B by PCR NEGATIVE NEGATIVE Final    Comment: (NOTE) The Xpert Xpress SARS-CoV-2/FLU/RSV plus assay is intended as an aid in the diagnosis of influenza from Nasopharyngeal swab specimens and should not be used as a sole basis for treatment. Nasal washings and aspirates are unacceptable for Xpert Xpress SARS-CoV-2/FLU/RSV testing.  Fact Sheet for Patients: EntrepreneurPulse.com.au  Fact Sheet for Healthcare Providers: IncredibleEmployment.be  This test is not yet approved or cleared by the [place] FDA and has been authorized for detection and/or diagnosis of SARS-CoV-2 by FDA under an Emergency Use Authorization (EUA). This EUA will remain in effect (meaning this test can be used) for the duration of the COVID-19 declaration under Section 564(b)(1) of the Act, 21 U.S.C. section 360bbb-3(b)(1), unless the authorization is terminated or revoked.  Performed at [facility], [place] [address], [place], [place] [address]   Culture, blood (Routine X  2) w Reflex to ID Panel     Status: None (Preliminary result)   Collection Time: 10/26/20  4:49 AM   Specimen: BLOOD RIGHT FOREARM  Result Value Ref Range Status   Specimen Description BLOOD RIGHT FOREARM  Final   Special Requests   Final    BOTTLES DRAWN AEROBIC AND ANAEROBIC Blood Culture adequate volume   Culture   Final    NO GROWTH 2 DAYS Performed at [facility], [place] [address], [place], [place] [address]    Report Status PENDING  Incomplete  Culture, blood (Routine X 2) w Reflex to ID Panel     Status: None (Preliminary result)   Collection Time:  10/26/20  4:54 AM   Specimen: BLOOD RIGHT HAND  Result Value Ref Range Status   Specimen Description BLOOD RIGHT HAND  Final   Special Requests   Final    BOTTLES DRAWN AEROBIC AND ANAEROBIC Blood Culture adequate volume   Culture   Final    NO GROWTH 2 DAYS Performed at [facility], [address], [place], [place] [address]    Report Status PENDING  Incomplete  MRSA PCR Screening     Status: None   Collection Time: 10/27/20  1:56 PM   Specimen: Nasopharyngeal  Result Value Ref Range Status   MRSA by PCR NEGATIVE NEGATIVE Final    Comment:        The GeneXpert MRSA Assay (FDA approved for NASAL specimens only), is one component of a comprehensive MRSA colonization surveillance program. It is not intended to diagnose MRSA infection nor to guide or monitor treatment for MRSA infections. Performed at [facility], [place] [address], [place], [place] [address]       Studies: No results found.  Scheduled Meds: . aspirin  162 mg Oral Daily  . Chlorhexidine Gluconate Cloth  6 each Topical Q0600  . [START ON 10/29/2020] doxercalciferol  2 mcg Intravenous Q M,W,F-HD  . heparin injection (subcutaneous)  5,000 Units Subcutaneous Q8H  . insulin aspart  0-6 Units Subcutaneous TID WC  . insulin glargine  7 Units Subcutaneous QHS  . ipratropium-albuterol  3 mL Nebulization Q6H  . labetalol  100 mg Oral TID  .  multivitamin  1 tablet Oral QHS  . pentoxifylline  400 mg Oral Q breakfast  . sevelamer carbonate  800 mg Oral TID WC    Continuous Infusions: . [START ON 10/29/2020] ceFEPime (MAXIPIME) IV    . [START ON 10/29/2020] vancomycin       LOS: 2 days     Alma Friendly, MD [facility]  If 7PM-7AM, please contact night-coverage [email or web address] 10/28/2020, 2:57 PM

## 2020-10-28 NOTE — Progress Notes (Signed)
Subjective:  Had extra HD yest-  Removed 2.5 liters-  Post weight 75.2   Still SOB and still wheezing, also coughing but also not sure she is great historian   Objective Vital signs in last 24 hours: Vitals:   10/27/20 1829 10/27/20 1936 10/28/20 0413 10/28/20 0929  BP: (!) 139/58 (!) 107/55 (!) 113/54 (!) 130/55  Pulse: 87 82 80 79  Resp: (!) 21 18 18 18   Temp: 97.9 F (36.6 C) 99.1 F (37.3 C) 98.8 F (37.1 C) 98.4 F (36.9 C)  TempSrc: Oral Oral Oral Oral  SpO2: 91% 96% 92% 90%  Weight:      Height:       Weight change:   Intake/Output Summary (Last 24 hours) at 10/28/2020 1058 Last data filed at 10/28/2020 0900 Gross per 24 hour  Intake 480 ml  Output 2512 ml  Net -2032 ml    Dialysis Orders:  MWF-[facility] in [place]  4hrs, BFR 400, DFR 500,  EDW 81kg, 2K/ 2.5Ca  Access: RUE AVF Heparin: none Epogen 2000 units IV push 3x qw Hectorol 88mcg IV 3x qw Home meds: Renvela 800mg  3 tabs po tid with meals and 2 with snacks  Last Labs: Hgb 10.9, K 5.6, Ca 7.7, Alb 3.5  Assessment/Plan: 1.  Possible HCAP: WBC elevated on admission-now 20.6. BC cultures X 2 pending. COVID and Flu (-). Noted patient with expiratory wheezing. CXR showed developing opacities. Continue ABX (maxepime and vanc) and DuoNebs-  Maybe needs some steroids ?? 2.  ESRD -  On HD MWF- HD Fri morning- felt better but then more SOB- extra treatment yest  mostly focused on volume.  Then plan will be regular treatment in Monday 3.  Hypertension/volume  - Noted high Bps sys 200s-BP now improved after HD, more likley in the setting of volume overload. Better.  Is 6 kg under her EDW 4.  Anemia of CKD - Hgb slowly trending downward-was 11.8 at admission now 11.1-no need for ESA yet 5.  Secondary Hyperparathyroidism -  CorrCa 8.1- cont Hectorol.  PO4 OK on low dose renvela.- no change 6.  Nutrition - Continue renal diet with fluid restriction 7. Hyperkalemia-  Was given one dose of lokelma-  now HD to correctLouis Howard    Labs: Basic Metabolic Panel: Recent Labs  Lab 10/26/20 0226 10/27/20 0334 10/28/20 0436  NA 129* 133* 134*  K 5.6* 4.5 4.0  CL 91* 94* 92*  CO2 21* 23 28  GLUCOSE 354* 146* 141*  BUN 85* 43* 36*  CREATININE 11.02* 7.15* 6.17*  CALCIUM 7.7* 7.8* 8.1*  PHOS  --  4.7* 3.7   Liver Function Tests: Recent Labs  Lab 10/26/20 0226 10/27/20 0334 10/28/20 0436  AST 42*  --   --   ALT 26  --   --   ALKPHOS 110  --   --   BILITOT 0.8  --   --   PROT 7.7  --   --   ALBUMIN 3.5 2.7* 2.5*   No results for input(s): LIPASE, AMYLASE in the last 168 hours. No results for input(s): AMMONIA in the last 168 hours. CBC: Recent Labs  Lab 10/25/20 1925 10/26/20 0449 10/27/20 0334 10/28/20 0436  WBC 18.1* 20.6* 24.5* 20.7*  NEUTROABS 15.9*  --  21.0* 17.4*  HGB 11.8* 10.9* 11.1* 11.2*  HCT 34.6* 31.7* 32.4* 31.5*  MCV 93.0 92.2 91.3 89.0  PLT 130* 90* 104* 124*   Cardiac Enzymes:  No results for input(s): CKTOTAL, CKMB, CKMBINDEX, TROPONINI in the last 168 hours. CBG: Recent Labs  Lab 10/27/20 1134 10/27/20 1831 10/27/20 2155 10/28/20 0409 10/28/20 0711  GLUCAP 253* 131* 145* 132* 132*    Iron Studies: No results for input(s): IRON, TIBC, TRANSFERRIN, FERRITIN in the last 72 hours. Studies/Results: DG Chest Port 1 View  Result Date: 10/27/2020 CLINICAL DATA:  Dyspnea EXAM: PORTABLE CHEST - 1 VIEW COMPARISON:  10/25/2020 FINDINGS: Increasing coarse airspace opacities in the left mid and lower lung. There are some very mild patchy opacities in the right lower lung. Stable cardiomegaly. No effusion.  No pneumothorax. Visualized bones unremarkable. IMPRESSION: 1. Developing asymmetric scattered airspace opacities, left greater than right. Electronically Signed   By: Lucrezia Europe M.D.   On: 10/27/2020 10:39   Medications: Infusions: . [START ON 10/29/2020] ceFEPime (MAXIPIME) IV    . [START ON 10/29/2020] vancomycin      Scheduled  Medications: . aspirin  162 mg Oral Daily  . Chlorhexidine Gluconate Cloth  6 each Topical Q0600  . Chlorhexidine Gluconate Cloth  6 each Topical Q0600  . [START ON 10/29/2020] doxercalciferol  2 mcg Intravenous Q M,W,F-HD  . heparin injection (subcutaneous)  5,000 Units Subcutaneous Q8H  . insulin aspart  0-6 Units Subcutaneous TID WC  . insulin glargine  7 Units Subcutaneous QHS  . labetalol  100 mg Oral TID  . multivitamin  1 tablet Oral QHS  . pentoxifylline  400 mg Oral Q breakfast  . sevelamer carbonate  800 mg Oral TID WC    have reviewed scheduled and prn medications.  Physical Exam: General:  Still c/o SOB Heart: RRR Lungs: wheezing.... rapid resp rate Abdomen: soft, non tender Extremities: minimal edema Dialysis Access: right AVF- patent    10/28/2020,10:58 AM  LOS: 2 days

## 2020-10-28 NOTE — Plan of Care (Signed)
  Problem: Education: Goal: Knowledge of General Education information will improve Description Including pain rating scale, medication(s)/side effects and non-pharmacologic comfort measures Outcome: Progressing   

## 2020-10-29 DIAGNOSIS — E118 Type 2 diabetes mellitus with unspecified complications: Secondary | ICD-10-CM | POA: Diagnosis not present

## 2020-10-29 DIAGNOSIS — R778 Other specified abnormalities of plasma proteins: Secondary | ICD-10-CM | POA: Diagnosis not present

## 2020-10-29 DIAGNOSIS — J9601 Acute respiratory failure with hypoxia: Secondary | ICD-10-CM | POA: Diagnosis not present

## 2020-10-29 DIAGNOSIS — N186 End stage renal disease: Secondary | ICD-10-CM | POA: Diagnosis not present

## 2020-10-29 LAB — CBC WITH DIFFERENTIAL/PLATELET
Abs Immature Granulocytes: 0.13 10*3/uL — ABNORMAL HIGH (ref 0.00–0.07)
Basophils Absolute: 0 10*3/uL (ref 0.0–0.1)
Basophils Relative: 0 %
Eosinophils Absolute: 0.2 10*3/uL (ref 0.0–0.5)
Eosinophils Relative: 1 %
HCT: 30.3 % — ABNORMAL LOW (ref 36.0–46.0)
Hemoglobin: 10.4 g/dL — ABNORMAL LOW (ref 12.0–15.0)
Immature Granulocytes: 1 %
Lymphocytes Relative: 9 %
Lymphs Abs: 1.3 10*3/uL (ref 0.7–4.0)
MCH: 30.5 pg (ref 26.0–34.0)
MCHC: 34.3 g/dL (ref 30.0–36.0)
MCV: 88.9 fL (ref 80.0–100.0)
Monocytes Absolute: 1.3 10*3/uL — ABNORMAL HIGH (ref 0.1–1.0)
Monocytes Relative: 9 %
Neutro Abs: 11.3 10*3/uL — ABNORMAL HIGH (ref 1.7–7.7)
Neutrophils Relative %: 80 %
Platelets: 137 10*3/uL — ABNORMAL LOW (ref 150–400)
RBC: 3.41 MIL/uL — ABNORMAL LOW (ref 3.87–5.11)
RDW: 14 % (ref 11.5–15.5)
WBC: 14.3 10*3/uL — ABNORMAL HIGH (ref 4.0–10.5)
nRBC: 0 % (ref 0.0–0.2)

## 2020-10-29 LAB — RENAL FUNCTION PANEL
Albumin: 2.5 g/dL — ABNORMAL LOW (ref 3.5–5.0)
Anion gap: 13 (ref 5–15)
BUN: 53 mg/dL — ABNORMAL HIGH (ref 8–23)
CO2: 25 mmol/L (ref 22–32)
Calcium: 8 mg/dL — ABNORMAL LOW (ref 8.9–10.3)
Chloride: 95 mmol/L — ABNORMAL LOW (ref 98–111)
Creatinine, Ser: 8.25 mg/dL — ABNORMAL HIGH (ref 0.44–1.00)
GFR, Estimated: 4 mL/min — ABNORMAL LOW (ref >60)
Glucose, Bld: 190 mg/dL — ABNORMAL HIGH (ref 70–99)
Phosphorus: 3 mg/dL (ref 2.5–4.6)
Potassium: 4.3 mmol/L (ref 3.5–5.1)
Sodium: 133 mmol/L — ABNORMAL LOW (ref 135–145)

## 2020-10-29 LAB — GLUCOSE, CAPILLARY
Glucose-Capillary: 195 mg/dL — ABNORMAL HIGH (ref 70–99)
Glucose-Capillary: 218 mg/dL — ABNORMAL HIGH (ref 70–99)
Glucose-Capillary: 156 mg/dL — ABNORMAL HIGH (ref 70–99)
Glucose-Capillary: 219 mg/dL — ABNORMAL HIGH (ref 70–99)

## 2020-10-29 MED ORDER — DM-GUAIFENESIN ER 30-600 MG PO TB12
1.0000 | ORAL_TABLET | Freq: Two times a day (BID) | ORAL | Status: DC
  Administered 2020-10-29 – 2020-11-08 (×18): 1 via ORAL
  Filled 2020-10-29 (×19): qty 1

## 2020-10-29 MED ORDER — DOXERCALCIFEROL 4 MCG/2ML IV SOLN
INTRAVENOUS | Status: AC
  Administered 2020-10-29: 2 ug via INTRAVENOUS
  Filled 2020-10-29: qty 2

## 2020-10-29 MED ORDER — LABETALOL HCL 100 MG PO TABS
100.0000 mg | ORAL_TABLET | Freq: Two times a day (BID) | ORAL | Status: DC
  Administered 2020-10-29 – 2020-11-01 (×7): 100 mg via ORAL
  Filled 2020-10-29 (×7): qty 1

## 2020-10-29 NOTE — Evaluation (Signed)
Physical Therapy Evaluation Patient Details Name: [name] MRN: [id number] DOB: 20-Sep-1940 Today's Date: 10/29/2020   History of Present Illness  80 y.o. female, presented to ED 10/25/20 with R sided chest pain for 1.5 weeks and shortness of breath. Chest CT showed acute abnormalities  Admitted for treatment for acute hypoxic respiratrory failure, suspected HCAP, hyperkalemia and volume overload  PMH: ESRD on HD MWF, HTN, T2DM, stroke  Clinical Impression  PTA pt living with 2 sons who work 3rd shift in single story home with 3 steps with rails to enter. Pt uses a cane for household ambulation and to get to/from HD. Pt does her own dressing, is supervised by her daughter for bathing. Pt's daughter provides meals, medication management, and transportation. Pt is limited in safe mobility by decreased cognition at baseline and generalized weakness. Pt is min A for bed mobility, maxA for transfers. Pt unable to takes steps due to weakness. PT recommending SNF level rehab for strengthening prior to going home. PT will continue to follow acutely.     Follow Up Recommendations SNF    Equipment Recommendations  3in1 (PT)    Recommendations for Other Services OT consult     Precautions / Restrictions Precautions Precautions: Fall Restrictions Weight Bearing Restrictions: No      Mobility  Bed Mobility Overal bed mobility: Needs Assistance Bed Mobility: Supine to Sit;Sit to Supine     Supine to sit: Min assist;HOB elevated Sit to supine: Min guard   General bed mobility comments: pt able to manage LE off bed, requires min A for reaching across body to pull on bedrail and for bringing trunk to upright, vc for scooting hips towards EoB. Mentions slight dizziness with sitting up but dissipates quickly.  After standing pt able to manage LE back into bed require assist to scoot up in bed    Transfers Overall transfer level: Needs assistance   Transfers: Sit to/from Stand Sit to  Stand: Max assist         General transfer comment: attempted to stand at RW, unable to clear hips from bed, exhibits full body tremors with attempt to power up, daughter reports it is her normal,  requires maxA for power up with face to face assist,  Ambulation/Gait             General Gait Details: unable to take steps towards HoB         Balance Overall balance assessment: Needs assistance Sitting-balance support: Feet supported;Feet unsupported;No upper extremity supported;Bilateral upper extremity supported Sitting balance-Leahy Scale: Fair     Standing balance support: Bilateral upper extremity supported Standing balance-Leahy Scale: Zero Standing balance comment: requires max outside support                             Pertinent Vitals/Pain Pain Assessment: Faces Faces Pain Scale: Hurts little more Pain Location: L chest Pain Descriptors / Indicators: Grimacing;Guarding Pain Intervention(s): Limited activity within patient's tolerance;Monitored during session;Repositioned    Home Living Family/patient expects to be discharged to:: Private residence Living Arrangements: Children Available Help at Discharge: Family;Available 24 hours/day Type of Home: House Home Access: Stairs to enter Entrance Stairs-Rails: Can reach both Entrance Stairs-Number of Steps: 3 Home Layout: One level Home Equipment: Cane - single point;Walker - 2 wheels;Shower seat Additional Comments: pt lives with her brothers but they work 3rd shift so daughter comes in to be with her then and also takes her to HD  Prior Function Level of Independence: Needs assistance   Gait / Transfers Assistance Needed: ambulates with cane in home and to get to HD  ADL's / Homemaking Assistance Needed: supervision for shower does own dressing, daughter manages medications, shopping and transportation           Extremity/Trunk Assessment   Upper Extremity Assessment Upper Extremity  Assessment: Defer to OT evaluation    Lower Extremity Assessment Lower Extremity Assessment: Generalized weakness       Communication   Communication: No difficulties  Cognition Arousal/Alertness: Awake/alert Behavior During Therapy: Flat affect Overall Cognitive Status: Impaired/Different from baseline Area of Impairment: Orientation                 Orientation Level: Time;Disoriented to                    General Comments General comments (skin integrity, edema, etc.): Pt daughter present and provides home set up and PLOF, pt on 1 L O2 via [place] with SaO2 91%O2, improved to 93%O2 with mobility dropped back to 91%O2 with supine.        Assessment/Plan    PT Assessment Patient needs continued PT services  PT Problem List Decreased strength;Decreased activity tolerance;Decreased balance;Decreased mobility;Decreased cognition;Cardiopulmonary status limiting activity       PT Treatment Interventions DME instruction;Gait training;Functional mobility training;Therapeutic exercise;Balance training;Cognitive remediation;Patient/family education    PT Goals (Current goals can be found in the Care Plan section)  Acute Rehab PT Goals Patient Stated Goal: go home PT Goal Formulation: With patient/family Time For Goal Achievement: 11/12/20 Potential to Achieve Goals: Good    Frequency Min 2X/week    AM-PAC PT "6 Clicks" Mobility  Outcome Measure Help needed turning from your back to your side while in a flat bed without using bedrails?: A Little Help needed moving from lying on your back to sitting on the side of a flat bed without using bedrails?: A Little Help needed moving to and from a bed to a chair (including a wheelchair)?: Total Help needed standing up from a chair using your arms (e.g., wheelchair or bedside chair)?: Total Help needed to walk in hospital room?: Total Help needed climbing 3-5 steps with a railing? : Total 6 Click Score: 10    End of  Session Equipment Utilized During Treatment: Gait belt;Oxygen Activity Tolerance: Patient tolerated treatment well Patient left: in bed;with call bell/phone within reach;with bed alarm set;with family/visitor present Nurse Communication: Mobility status PT Visit Diagnosis: Unsteadiness on feet (R26.81);Other abnormalities of gait and mobility (R26.89);Muscle weakness (generalized) (M62.81);Difficulty in walking, not elsewhere classified (R26.2)    Time: 4431-5400 PT Time Calculation (min) (ACUTE ONLY): 20 min   Charges:   PT Evaluation $PT Eval Moderate Complexity: 1 Mod          Kellene Mccleary B. Migdalia Dk PT, DPT Acute Rehabilitation Services Pager [phone] Office [phone]   [place] 10/29/2020, 10:16 AM

## 2020-10-29 NOTE — Evaluation (Signed)
Occupational Therapy Evaluation Patient Details Name: [name] MRN: [id number] DOB: [birth year] Today's Date: 10/29/2020    History of Present Illness 80 y.o. female, presented to ED 10/25/20 with R sided chest pain for 1.5 weeks and shortness of breath. Chest CT showed acute abnormalities  Admitted for treatment for acute hypoxic respiratrory failure, suspected HCAP, hyperkalemia and volume overload  PMH: ESRD on HD MWF, HTN, T2DM, stroke   Clinical Impression   Pt presents with decline in function and safety with ADLs and ADL mobility with impaired strength, balance, endurance and hx of cognitive impairments. PTA pt living with 2 sons who work 3rd shift in single story home with 3 steps with rails to enter.  Pt Ind with toileting, grooming and  dressing, is supervised by her daughter for bathing. Pt uses a cane for household ambulation and to get to/from Pt's daughter provides meals, medication management, and transportation. Pt currently required min guard A with UB ADLS, max A with LB ADLS, max A with toileting and max - mod A with transfers using RW. Pt would benefit from acute OT services to address impairments to maximize level of function and safety    Follow Up Recommendations  SNF    Equipment Recommendations  3 in 1 bedside commode;Other (comment) (TBD at next venue of care)    Recommendations for Other Services       Precautions / Restrictions Precautions Precautions: Fall Restrictions Weight Bearing Restrictions: No      Mobility Bed Mobility Overal bed mobility: Needs Assistance Bed Mobility: Supine to Sit;Sit to Supine     Supine to sit: Min assist;HOB elevated Sit to supine: Min assist   General bed mobility comments: min A to manage LEs    Transfers Overall transfer level: Needs assistance Equipment used: Rolling walker (2 wheeled) Transfers: Sit to/from Stand Sit to Stand: Max assist         General transfer comment: stood at [facility] after 2  attempts, max A, required power up. Pt able to side step towards [facility]    Balance Overall balance assessment: Needs assistance Sitting-balance support: Feet supported;Feet unsupported;No upper extremity supported;Bilateral upper extremity supported Sitting balance-Leahy Scale: Fair     Standing balance support: Bilateral upper extremity supported;During functional activity Standing balance-Leahy Scale: Poor Standing balance comment: requires max outside support                           ADL either performed or assessed with clinical judgement   ADL Overall ADL's : Needs assistance/impaired Eating/Feeding: Set up;Independent;Sitting   Grooming: Dance movement psychotherapist;Wash/dry hands;Min guard;Sitting   Upper Body Bathing: Min guard;Sitting   Lower Body Bathing: Maximal assistance   Upper Body Dressing : Min guard;Sitting   Lower Body Dressing: Maximal assistance   Toilet Transfer: Maximal assistance;Moderate assistance;Stand-pivot;BSC;Cueing for safety;Cueing for sequencing;RW   Toileting- Clothing Manipulation and Hygiene: Maximal assistance;Sit to/from stand       Functional mobility during ADLs: Maximal assistance;Moderate assistance;Cueing for safety;Rolling walker       Vision Baseline Vision/History: Wears glasses Patient Visual Report: No change from baseline       Perception     Praxis      Pertinent Vitals/Pain Pain Assessment: Faces Faces Pain Scale: Hurts little more Pain Location: L chest Pain Descriptors / Indicators: Grimacing;Guarding Pain Intervention(s): Limited activity within patient's tolerance;Monitored during session;Repositioned     Hand Dominance Right   Extremity/Trunk Assessment Upper Extremity Assessment Upper Extremity Assessment: Generalized weakness  Lower Extremity Assessment Lower Extremity Assessment: Defer to PT evaluation       Communication Communication Communication: No difficulties   Cognition  Arousal/Alertness: Awake/alert Behavior During Therapy: Flat affect Overall Cognitive Status: Impaired/Different from baseline Area of Impairment: Orientation;Safety/judgement                 Orientation Level: Time;Disoriented to                 General Comments  Pt daughter present and provides home set up and PLOF, pt on 1 L O2 via [place] with SaO2 91%O2, improved to 93%O2 with mobility dropped back to 91%O2 with supine.    Exercises     Shoulder Instructions      Home Living Family/patient expects to be discharged to:: Private residence Living Arrangements: Children Available Help at Discharge: Family;Available 24 hours/day Type of Home: House Home Access: Stairs to enter [employer] of Steps: 3 Entrance Stairs-Rails: Can reach both Home Layout: One level     Bathroom Shower/Tub: Teacher, early years/pre: Standard Bathroom Accessibility: Yes   Home Equipment: Cane - single point;Walker - 2 wheels;Shower seat   Additional Comments: pt lives with her brothers but they work 3rd shift so daughter comes in to be with her then and also takes her to HD      Prior Functioning/Environment Level of Independence: Needs assistance  Gait / Transfers Assistance Needed: ambulates with cane in home and to get to HD ADL's / Homemaking Assistance Needed: supervision for shower does own dressing, toileting; daughter manages medications, shopping and transportation            OT Problem List: Decreased strength;Impaired balance (sitting and/or standing);Decreased cognition;Decreased safety awareness;Decreased activity tolerance;Decreased knowledge of use of DME or AE      OT Treatment/Interventions: Self-care/ADL training;Balance training;Therapeutic exercise;Therapeutic activities;DME and/or AE instruction    OT Goals(Current goals can be found in the care plan section) Acute Rehab OT Goals Patient Stated Goal: go home OT Goal Formulation: With  patient/family Time For Goal Achievement: 11/12/20 Potential to Achieve Goals: Good ADL Goals Pt Will Perform Grooming: with min guard assist;standing;with supervision Pt Will Perform Upper Body Bathing: with supervision;with set-up;sitting Pt Will Perform Lower Body Bathing: with mod assist;sitting/lateral leans Pt Will Perform Upper Body Dressing: with supervision;with set-up;sitting Pt Will Transfer to Toilet: with mod assist;with min assist;bedside commode Pt Will Perform Toileting - Clothing Manipulation and hygiene: with mod assist;with min assist;sit to/from stand  OT Frequency: Min 2X/week   Barriers to D/C:            Co-evaluation              AM-PAC OT "6 Clicks" Daily Activity     Outcome Measure Help from another person eating meals?: None Help from another person taking care of personal grooming?: A Little Help from another person toileting, which includes using toliet, bedpan, or urinal?: A Lot Help from another person bathing (including washing, rinsing, drying)?: A Lot Help from another person to put on and taking off regular upper body clothing?: A Little Help from another person to put on and taking off regular lower body clothing?: A Lot 6 Click Score: 16   End of Session Equipment Utilized During Treatment: Gait belt;Rolling walker Nurse Communication: Mobility status  Activity Tolerance: Patient limited by fatigue Patient left: in bed;with call bell/phone within reach;with bed alarm set  OT Visit Diagnosis: Other abnormalities of gait and mobility (R26.89);Unsteadiness on feet (R26.81);Muscle weakness (  generalized) (M62.81);Other symptoms and signs involving cognitive function                Time: 1025-1051 OT Time Calculation (min): 26 min Charges:  OT General Charges $OT Visit: 1 Visit OT Evaluation $OT Eval Moderate Complexity: 1 Mod OT Treatments $Self Care/Home Management : 8-22 mins    Emmit Alexanders [facility] 10/29/2020, 1:06 PM

## 2020-10-29 NOTE — Progress Notes (Signed)
Subjective: Talk to patient and her daughter today.  Patient states she continues to a little bit better.  Still has some shortness of breath.  Objective Vital signs in last 24 hours: Vitals:   10/29/20 1330 10/29/20 1400 10/29/20 1430 10/29/20 1500  BP: (!) 107/56 (!) 123/45 (!) 112/59 122/60  Pulse: 80 77 77 76  Resp:      Temp:      TempSrc:      SpO2:      Weight:      Height:       Weight change:   Intake/Output Summary (Last 24 hours) at 10/29/2020 1516 Last data filed at 10/29/2020 0900 Gross per 24 hour  Intake 480 ml  Output --  Net 480 ml    Dialysis Orders:  MWF-[facility] in [place]  4hrs, BFR 400, DFR 500,  EDW 81kg, 2K/ 2.5Ca  Access: RUE AVF Heparin: none Epogen 2000 units IV push 3x qw Hectorol 18mcg IV 3x qw Home meds: Renvela 800mg  3 tabs po tid with meals and 2 with snacks  Last Labs: Hgb 10.9, K 5.6, Ca 7.7, Alb 3.5  Assessment/Plan: 1.  Possible HCAP: WBC elevated on admission-now 20.6. BC cultures X 2 pending. COVID and Flu (-). Noted patient with expiratory wheezing. CXR showed developing opacities.  Treatment per primary team 2.  ESRD -  On HD MWF-received extra treatment over the weekend with volume removal which likely helped.  Continue dialysis today and likely maintain schedule thereafter 3.  Hypertension/volume  -aggressive ultrafiltration with dialysis has improved blood pressure.  Needs new dry weight outpatient 4.  Anemia of CKD -hemoglobin acceptable at 10.4 5.  Secondary Hyperparathyroidism -  CorrCa 8.1- cont Hectorol.  PO4 OK on low dose renvela.- no change 6.  Nutrition - Continue renal diet with fluid restriction 7. Hyperkalemia-  stable now with dialysis     Reesa Chew    Labs: Basic Metabolic Panel: Recent Labs  Lab 10/27/20 0334 10/28/20 0436 10/29/20 0342  NA 133* 134* 133*  K 4.5 4.0 4.3  CL 94* 92* 95*  CO2 23 28 25   GLUCOSE 146* 141* 190*  BUN 43* 36* 53*  CREATININE 7.15* 6.17* 8.25*  CALCIUM 7.8*  8.1* 8.0*  PHOS 4.7* 3.7 3.0   Liver Function Tests: Recent Labs  Lab 10/26/20 0226 10/27/20 0334 10/28/20 0436 10/29/20 0342  AST 42*  --   --   --   ALT 26  --   --   --   ALKPHOS 110  --   --   --   BILITOT 0.8  --   --   --   PROT 7.7  --   --   --   ALBUMIN 3.5 2.7* 2.5* 2.5*   No results for input(s): LIPASE, AMYLASE in the last 168 hours. No results for input(s): AMMONIA in the last 168 hours. CBC: Recent Labs  Lab 10/25/20 1925 10/26/20 0449 10/27/20 0334 10/28/20 0436 10/29/20 0342  WBC 18.1* 20.6* 24.5* 20.7* 14.3*  NEUTROABS 15.9*  --  21.0* 17.4* 11.3*  HGB 11.8* 10.9* 11.1* 11.2* 10.4*  HCT 34.6* 31.7* 32.4* 31.5* 30.3*  MCV 93.0 92.2 91.3 89.0 88.9  PLT 130* 90* 104* 124* 137*   Cardiac Enzymes: No results for input(s): CKTOTAL, CKMB, CKMBINDEX, TROPONINI in the last 168 hours. CBG: Recent Labs  Lab 10/28/20 1137 10/28/20 1642 10/28/20 2023 10/29/20 0634 10/29/20 1120  GLUCAP 179* 241* 239* 195* 218*    Iron Studies: No results for  input(s): IRON, TIBC, TRANSFERRIN, FERRITIN in the last 72 hours. Studies/Results: No results found. Medications: Infusions: . ceFEPime (MAXIPIME) IV      Scheduled Medications: . aspirin  162 mg Oral Daily  . Chlorhexidine Gluconate Cloth  6 each Topical Q0600  . dextromethorphan-guaiFENesin  1 tablet Oral BID  . doxercalciferol  2 mcg Intravenous Q M,W,F-HD  . heparin injection (subcutaneous)  5,000 Units Subcutaneous Q8H  . insulin aspart  0-6 Units Subcutaneous TID WC  . insulin glargine  7 Units Subcutaneous QHS  . labetalol  100 mg Oral TID  . multivitamin  1 tablet Oral QHS  . pentoxifylline  400 mg Oral Q breakfast  . sevelamer carbonate  800 mg Oral TID WC    have reviewed scheduled and prn medications.  Physical Exam: General:  Still c/o SOB Heart: RRR Lungs: Diffuse wheezing, mild tachypnea Abdomen: soft, non tender Extremities: minimal edema Dialysis Access: right AVF- patent     10/29/2020,3:16 PM  LOS: 3 days

## 2020-10-29 NOTE — Progress Notes (Signed)
MRSA PCR neg. Ok to stop vanc per Dr Horris Latino.  Onnie Boer, PharmD, BCIDP, AAHIVP, CPP Infectious Disease Pharmacist 10/29/2020 10:20 AM

## 2020-10-29 NOTE — Progress Notes (Signed)
PROGRESS NOTE  CHELSI WARR RSW:[id number] DOB: 04/05/1941 DOA: 10/25/2020 PCP: [name], MD  HPI/Recap of past 24 hours: HPI from Dr Wilnette Kales is a 80 y.o. female with medical history significant of ESRD on HD MWF, HTN, T2DM, stroke, who presented with chest pain and shortness of breath.  Patient is a poor historian.  The HPI was obtained by chart review, talking to patient and ED staff. Patient reports that she has had right-sided chest pain "for a while", unable to explain further. Pt was evaluated for right rib pain at the ED on 09/20/20. Chest CT W/O contrast suggested "Minimal right pleural effusion" and " small sliding style hiatal hernia".  Patient also reports increased shortness of breath and productive cough of brownish sputum for a few days. She apparently missed her last HD session. In the ED, pt was afebrile with pulse 100s, RR 22, BP 243/104 and O2 sat 90% on 2 LNC. Labs showed Na 127, K 6.2, BG 610, BUN 82, Cr 10.55, troponin 34, WBC 18.1, negative COVID and flu.  EKG showed sinus tachycardia. CXR showed no acute changes.  Patient admitted for further management.     Today, patient reports some improvement, but still short of breath, no wheezing noted.  Still congested with cough.  Discussed with daughter at bedside   Assessment/Plan: Principal Problem:   Acute respiratory failure with hypoxia ([place]) Active Problems:   HLD (hyperlipidemia)   Hypertension associated with diabetes ([place])   HCAP (healthcare-associated pneumonia)   Hyperkalemia   Diabetes mellitus with complication (HCC)   ESRD (end stage renal disease) on dialysis ([place])   History of CVA (cerebrovascular accident)   Elevated troponin   Volume overload   Hyperglycemia   Hypertensive urgency   Likely volume overload due to missed HD ESRD on HD MWF Hyperkalemia, hyponatremia Nephrology consulted Continue renvela Daily renal panel Telemetry  Possible HCAP Reported productive cough,  WBC 18 K on admission BC x2 NGTD Negative COVID and flu Chest x-ray with no acute changes, repeat with developing opacities Continue empiric cefepime, d/c vancomycin as MRSA PCR neg Inhaler, DuoNebs as needed  Atypical chest pain Currently chest pain-free Likely 2/2 volume overload Vs ??PNA Mildly elevated troponin with flat trend EKG with no acute ST changes Telemetry  Hypertensive crisis Improving with HD BP was 243/104 on presentation Continue p.o. labetalol, IV as needed  Diabetes mellitus type 2, uncontrolled with hyperglycemia Neuropathy On admission, CBG 610, no evidence of HHS or DKA Continue SSI, lantus, Accu-Cheks, hypoglycemic protocol Continue gabapentin  Anemia of chronic kidney disease Hemoglobin around baseline Daily CBC  Chronic thrombocytopenia Daily CBC      Estimated body mass index is 26.41 kg/m as calculated from the following:   Height as of this encounter: 5\' 7"  (1.702 m).   Weight as of this encounter: 76.5 kg.     Code Status: Full  Family Communication: Discussed with daughter at bedside  Disposition Plan: Status is: Inpatient  Remains inpatient appropriate because:Inpatient level of care appropriate due to severity of illness   Dispo: The patient is from: Home              Anticipated d/c is to: SNF              Patient currently is not medically stable to d/c.   Difficult to place patient No    Consultants:  Nephrology  Procedures:  None  Antimicrobials:  Cefepime  Stopped Vancomycin on 10/29/20  DVT prophylaxis:  Boyd heparin   Objective: Vitals:   10/29/20 1400 10/29/20 1430 10/29/20 1500 10/29/20 1530  BP: (!) 123/45 (!) 112/59 122/60 (!) 115/48  Pulse: 77 77 76 76  Resp:      Temp:      TempSrc:      SpO2:      Weight:      Height:        Intake/Output Summary (Last 24 hours) at 10/29/2020 1538 Last data filed at 10/29/2020 0900 Gross per 24 hour  Intake 480 ml  Output --  Net 480 ml   Filed  Weights   10/27/20 1510 10/27/20 1752 10/29/20 1205  Weight: 77.9 kg 75.2 kg 76.5 kg    Exam:  General: NAD  Cardiovascular: S1, S2 present  Respiratory:  Diminished breath sounds bilaterally  Abdomen: Soft, nontender, nondistended, bowel sounds present  Musculoskeletal: No bilateral pedal edema noted  Skin: Normal  Psychiatry: Normal mood   Data Reviewed: CBC: Recent Labs  Lab 10/25/20 1925 10/26/20 0449 10/27/20 0334 10/28/20 0436 10/29/20 0342  WBC 18.1* 20.6* 24.5* 20.7* 14.3*  NEUTROABS 15.9*  --  21.0* 17.4* 11.3*  HGB 11.8* 10.9* 11.1* 11.2* 10.4*  HCT 34.6* 31.7* 32.4* 31.5* 30.3*  MCV 93.0 92.2 91.3 89.0 88.9  PLT 130* 90* 104* 124* 161*   Basic Metabolic Panel: Recent Labs  Lab 10/25/20 1925 10/26/20 0226 10/27/20 0334 10/28/20 0436 10/29/20 0342  NA 127* 129* 133* 134* 133*  K 6.2* 5.6* 4.5 4.0 4.3  CL 88* 91* 94* 92* 95*  CO2 22 21* 23 28 25   GLUCOSE 610* 354* 146* 141* 190*  BUN 82* 85* 43* 36* 53*  CREATININE 10.55* 11.02* 7.15* 6.17* 8.25*  CALCIUM 7.7* 7.7* 7.8* 8.1* 8.0*  PHOS  --   --  4.7* 3.7 3.0   GFR: Estimated Creatinine Clearance: 5.7 mL/min (A) (by C-G formula based on SCr of 8.25 mg/dL (H)). Liver Function Tests: Recent Labs  Lab 10/26/20 0226 10/27/20 0334 10/28/20 0436 10/29/20 0342  AST 42*  --   --   --   ALT 26  --   --   --   ALKPHOS 110  --   --   --   BILITOT 0.8  --   --   --   PROT 7.7  --   --   --   ALBUMIN 3.5 2.7* 2.5* 2.5*   No results for input(s): LIPASE, AMYLASE in the last 168 hours. No results for input(s): AMMONIA in the last 168 hours. Coagulation Profile: No results for input(s): INR, PROTIME in the last 168 hours. Cardiac Enzymes: No results for input(s): CKTOTAL, CKMB, CKMBINDEX, TROPONINI in the last 168 hours. BNP (last 3 results) No results for input(s): PROBNP in the last 8760 hours. HbA1C: No results for input(s): HGBA1C in the last 72 hours. CBG: Recent Labs  Lab 10/28/20 1137  10/28/20 1642 10/28/20 2023 10/29/20 0634 10/29/20 1120  GLUCAP 179* 241* 239* 195* 218*   Lipid Profile: No results for input(s): CHOL, HDL, LDLCALC, TRIG, CHOLHDL, LDLDIRECT in the last 72 hours. Thyroid Function Tests: No results for input(s): TSH, T4TOTAL, FREET4, T3FREE, THYROIDAB in the last 72 hours. Anemia Panel: No results for input(s): VITAMINB12, FOLATE, FERRITIN, TIBC, IRON, RETICCTPCT in the last 72 hours. Urine analysis:    Component Value Date/Time   COLORURINE YELLOW 10/15/2017 2244   APPEARANCEUR HAZY (A) 10/15/2017 2244   LABSPEC 1.015 10/15/2017 2244   PHURINE 7.0 10/15/2017 2244   GLUCOSEU >=500 (A)  10/15/2017 2244   HGBUR SMALL (A) 10/15/2017 2244   BILIRUBINUR NEGATIVE 10/15/2017 2244   KETONESUR NEGATIVE 10/15/2017 2244   PROTEINUR 100 (A) 10/15/2017 2244   UROBILINOGEN 0.2 11/19/2014 0909   NITRITE NEGATIVE 10/15/2017 2244   LEUKOCYTESUR MODERATE (A) 10/15/2017 2244   Sepsis Labs: @LABRCNTIP (procalcitonin:4,lacticidven:4)  ) Recent Results (from the past 240 hour(s))  Resp Panel by RT-PCR (Flu A&B, Covid) Nasopharyngeal Swab     Status: None   Collection Time: 10/25/20  7:26 PM   Specimen: Nasopharyngeal Swab; Nasopharyngeal(NP) swabs in vial transport medium  Result Value Ref Range Status   SARS Coronavirus 2 by RT PCR NEGATIVE NEGATIVE Final    Comment: (NOTE) SARS-CoV-2 target nucleic acids are NOT DETECTED.  The SARS-CoV-2 RNA is generally detectable in upper respiratory specimens during the acute phase of infection. The lowest concentration of SARS-CoV-2 viral copies this assay can detect is 138 copies/mL. A negative result does not preclude SARS-Cov-2 infection and should not be used as the sole basis for treatment or other patient management decisions. A negative result may occur with  improper specimen collection/handling, submission of specimen other than nasopharyngeal swab, presence of viral mutation(s) within the areas targeted by  this assay, and inadequate number of viral copies(<138 copies/mL). A negative result must be combined with clinical observations, patient history, and epidemiological information. The expected result is Negative.  Fact Sheet for Patients:  EntrepreneurPulse.com.au  Fact Sheet for Healthcare Providers:  IncredibleEmployment.be  This test is no t yet approved or cleared by the [place] FDA and  has been authorized for detection and/or diagnosis of SARS-CoV-2 by FDA under an Emergency Use Authorization (EUA). This EUA will remain  in effect (meaning this test can be used) for the duration of the COVID-19 declaration under Section 564(b)(1) of the Act, 21 U.S.C.section 360bbb-3(b)(1), unless the authorization is terminated  or revoked sooner.       Influenza A by PCR NEGATIVE NEGATIVE Final   Influenza B by PCR NEGATIVE NEGATIVE Final    Comment: (NOTE) The Xpert Xpress SARS-CoV-2/FLU/RSV plus assay is intended as an aid in the diagnosis of influenza from Nasopharyngeal swab specimens and should not be used as a sole basis for treatment. Nasal washings and aspirates are unacceptable for Xpert Xpress SARS-CoV-2/FLU/RSV testing.  Fact Sheet for Patients: EntrepreneurPulse.com.au  Fact Sheet for Healthcare Providers: IncredibleEmployment.be  This test is not yet approved or cleared by the [place] FDA and has been authorized for detection and/or diagnosis of SARS-CoV-2 by FDA under an Emergency Use Authorization (EUA). This EUA will remain in effect (meaning this test can be used) for the duration of the COVID-19 declaration under Section 564(b)(1) of the Act, 21 U.S.C. section 360bbb-3(b)(1), unless the authorization is terminated or revoked.  Performed at [facility], [place] [address], [place], [place] [address]   Culture, blood (Routine X 2) w Reflex to ID Panel     Status: None  (Preliminary result)   Collection Time: 10/26/20  4:49 AM   Specimen: BLOOD RIGHT FOREARM  Result Value Ref Range Status   Specimen Description BLOOD RIGHT FOREARM  Final   Special Requests   Final    BOTTLES DRAWN AEROBIC AND ANAEROBIC Blood Culture adequate volume   Culture   Final    NO GROWTH 3 DAYS Performed at [facility], [place] [address] [place], [place] [address]    Report Status PENDING  Incomplete  Culture, blood (Routine X 2) w Reflex to ID Panel  Status: None (Preliminary result)   Collection Time: 10/26/20  4:54 AM   Specimen: BLOOD RIGHT HAND  Result Value Ref Range Status   Specimen Description BLOOD RIGHT HAND  Final   Special Requests   Final    BOTTLES DRAWN AEROBIC AND ANAEROBIC Blood Culture adequate volume   Culture   Final    NO GROWTH 3 DAYS Performed at [facility], [address], [place], [place] [address]    Report Status PENDING  Incomplete  MRSA PCR Screening     Status: None   Collection Time: 10/27/20  1:56 PM   Specimen: Nasopharyngeal  Result Value Ref Range Status   MRSA by PCR NEGATIVE NEGATIVE Final    Comment:        The GeneXpert MRSA Assay (FDA approved for NASAL specimens only), is one component of a comprehensive MRSA colonization surveillance program. It is not intended to diagnose MRSA infection nor to guide or monitor treatment for MRSA infections. Performed at [facility], [place] [address] [place], [place] [address]       Studies: No results found.  Scheduled Meds: . aspirin  162 mg Oral Daily  . Chlorhexidine Gluconate Cloth  6 each Topical Q0600  . dextromethorphan-guaiFENesin  1 tablet Oral BID  . doxercalciferol  2 mcg Intravenous Q M,W,F-HD  . heparin injection (subcutaneous)  5,000 Units Subcutaneous Q8H  . insulin aspart  0-6 Units Subcutaneous TID WC  . insulin glargine  7 Units Subcutaneous QHS  . labetalol  100 mg Oral TID  . multivitamin  1 tablet Oral QHS  . pentoxifylline  400 mg  Oral Q breakfast  . sevelamer carbonate  800 mg Oral TID WC    Continuous Infusions: . ceFEPime (MAXIPIME) IV       LOS: 3 days     Alma Friendly, MD [facility]  If 7PM-7AM, please contact night-coverage [email or web address] 10/29/2020, 3:38 PM

## 2020-10-30 DIAGNOSIS — N186 End stage renal disease: Secondary | ICD-10-CM | POA: Diagnosis not present

## 2020-10-30 DIAGNOSIS — E118 Type 2 diabetes mellitus with unspecified complications: Secondary | ICD-10-CM | POA: Diagnosis not present

## 2020-10-30 DIAGNOSIS — J9601 Acute respiratory failure with hypoxia: Secondary | ICD-10-CM | POA: Diagnosis not present

## 2020-10-30 DIAGNOSIS — R778 Other specified abnormalities of plasma proteins: Secondary | ICD-10-CM | POA: Diagnosis not present

## 2020-10-30 LAB — CBC WITH DIFFERENTIAL/PLATELET
Abs Immature Granulocytes: 0.28 10*3/uL — ABNORMAL HIGH (ref 0.00–0.07)
Basophils Absolute: 0 10*3/uL (ref 0.0–0.1)
Basophils Relative: 0 %
Eosinophils Absolute: 0.2 10*3/uL (ref 0.0–0.5)
Eosinophils Relative: 2 %
HCT: 32.4 % — ABNORMAL LOW (ref 36.0–46.0)
Hemoglobin: 11 g/dL — ABNORMAL LOW (ref 12.0–15.0)
Immature Granulocytes: 3 %
Lymphocytes Relative: 13 %
Lymphs Abs: 1.4 10*3/uL (ref 0.7–4.0)
MCH: 30.8 pg (ref 26.0–34.0)
MCHC: 34 g/dL (ref 30.0–36.0)
MCV: 90.8 fL (ref 80.0–100.0)
Monocytes Absolute: 1.2 10*3/uL — ABNORMAL HIGH (ref 0.1–1.0)
Monocytes Relative: 11 %
Neutro Abs: 7.9 10*3/uL — ABNORMAL HIGH (ref 1.7–7.7)
Neutrophils Relative %: 71 %
Platelets: 154 10*3/uL (ref 150–400)
RBC: 3.57 MIL/uL — ABNORMAL LOW (ref 3.87–5.11)
RDW: 14.4 % (ref 11.5–15.5)
WBC: 11.1 10*3/uL — ABNORMAL HIGH (ref 4.0–10.5)
nRBC: 0 % (ref 0.0–0.2)

## 2020-10-30 LAB — RENAL FUNCTION PANEL
Albumin: 2.7 g/dL — ABNORMAL LOW (ref 3.5–5.0)
Anion gap: 9 (ref 5–15)
BUN: 24 mg/dL — ABNORMAL HIGH (ref 8–23)
CO2: 30 mmol/L (ref 22–32)
Calcium: 8.3 mg/dL — ABNORMAL LOW (ref 8.9–10.3)
Chloride: 94 mmol/L — ABNORMAL LOW (ref 98–111)
Creatinine, Ser: 4.27 mg/dL — ABNORMAL HIGH (ref 0.44–1.00)
GFR, Estimated: 10 mL/min — ABNORMAL LOW (ref >60)
Glucose, Bld: 178 mg/dL — ABNORMAL HIGH (ref 70–99)
Phosphorus: 2.8 mg/dL (ref 2.5–4.6)
Potassium: 3.9 mmol/L (ref 3.5–5.1)
Sodium: 133 mmol/L — ABNORMAL LOW (ref 135–145)

## 2020-10-30 LAB — GLUCOSE, CAPILLARY
Glucose-Capillary: 231 mg/dL — ABNORMAL HIGH (ref 70–99)
Glucose-Capillary: 221 mg/dL — ABNORMAL HIGH (ref 70–99)
Glucose-Capillary: 143 mg/dL — ABNORMAL HIGH (ref 70–99)
Glucose-Capillary: 243 mg/dL — ABNORMAL HIGH (ref 70–99)

## 2020-10-30 NOTE — Progress Notes (Addendum)
Pharmacy Antibiotic Note  [name] is a 80 y.o. female admitted on 10/25/2020 with pneumonia.  Pharmacy has been consulted for Cefepime dosing. 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 MRSA PCR is negative, so vancomycin d/c'd. Today is day 5 of 7 days of cefepime.   Plan: Cefepime 2gm IV qHD MWF Will f/u HD schedule, micro data, and pt's clinical condition Per MD, stop abx after 7 days (5/19)    Height: 5\' 7"  (170.2 cm) Weight: 73.3 kg (161 lb 9.6 oz) IBW/kg (Calculated) : 61.6  Temp (24hrs), Avg:98.1 F (36.7 C), Min:97.8 F (36.6 C), Max:98.4 F (36.9 C)  Recent Labs  Lab 10/26/20 0226 10/26/20 0449 10/27/20 0334 10/28/20 0436 10/29/20 0342 10/30/20 0438  WBC  --  20.6* 24.5* 20.7* 14.3* 11.1*  CREATININE 11.02*  --  7.15* 6.17* 8.25* 4.27*    Estimated Creatinine Clearance: 10 mL/min (A) (by C-G formula based on SCr of 4.27 mg/dL (H)).    Allergies  Allergen Reactions  . Ambien [Zolpidem] Other (See Comments)    Hallucinations   . Reglan [Metoclopramide] Other (See Comments)    "makes me crazy"    Antimicrobials this admission: 5/13 Vanc >> 5/16 5/13 Cefepime >> (5/19)  Microbiology results: 5/13 BCx pending    Wilfredo Canterbury A. Levada Dy, PharmD, BCPS, FNKF Clinical Pharmacist [place] Please utilize Amion for appropriate phone number to reach the unit pharmacist ([place])

## 2020-10-30 NOTE — Care Management Important Message (Signed)
Important Message  Patient Details  Name: [name] MRN: [id number] Date of Birth: 03/07/1941   Medicare Important Message Given:  Yes     Kassi Esteve P [place] 10/30/2020, 10:45 AM

## 2020-10-30 NOTE — Progress Notes (Signed)
PROGRESS NOTE  [name] PRF:[id number] DOB: Mar 07, 1941 DOA: 10/25/2020 PCP: [name], MD  HPI/Recap of past 24 hours: HPI from Dr Wilnette Kales is a 80 y.o. female with medical history significant of ESRD on HD MWF, HTN, T2DM, stroke, who presented with chest pain and shortness of breath.  Patient is a poor historian.  The HPI was obtained by chart review, talking to patient and ED staff. Patient reports that she has had right-sided chest pain "for a while", unable to explain further. Pt was evaluated for right rib pain at the ED on 09/20/20. Chest CT W/O contrast suggested "Minimal right pleural effusion" and " small sliding style hiatal hernia".  Patient also reports increased shortness of breath and productive cough of brownish sputum for a few days. She apparently missed her last HD session. In the ED, pt was afebrile with pulse 100s, RR 22, BP 243/104 and O2 sat 90% on 2 LNC. Labs showed Na 127, K 6.2, BG 610, BUN 82, Cr 10.55, troponin 34, WBC 18.1, negative COVID and flu.  EKG showed sinus tachycardia. CXR showed no acute changes.  Patient admitted for further management.     Today, patient reports some improvement in breathing, still c/o cough, denies any chest pain, abdominal pain, N/V, fever/chills.   Assessment/Plan: Principal Problem:   Acute respiratory failure with hypoxia (HCC) Active Problems:   HLD (hyperlipidemia)   Hypertension associated with diabetes ([place])   HCAP (healthcare-associated pneumonia)   Hyperkalemia   Diabetes mellitus with complication (HCC)   ESRD (end stage renal disease) on dialysis ([place])   History of CVA (cerebrovascular accident)   Elevated troponin   Volume overload   Hyperglycemia   Hypertensive urgency   Likely volume overload due to missed HD ESRD on HD MWF Hyperkalemia, hyponatremia Nephrology consulted Continue renvela Daily renal panel Telemetry  Possible HCAP Reported productive cough, WBC 18 K on  admission BC x2 NGTD Negative COVID and flu Chest x-ray with no acute changes, repeat with developing opacities Continue empiric cefepime, d/c vancomycin as MRSA PCR neg Inhaler, DuoNebs as needed  Atypical chest pain Currently chest pain-free Likely 2/2 volume overload Vs ??PNA Mildly elevated troponin with flat trend EKG with no acute ST changes Telemetry  Hypertensive crisis Improving with HD BP was 243/104 on presentation Continue p.o. labetalol, IV as needed  Diabetes mellitus type 2, uncontrolled with hyperglycemia Neuropathy On admission, CBG 610, no evidence of HHS or DKA Continue SSI, lantus, Accu-Cheks, hypoglycemic protocol Continue gabapentin  Anemia of chronic kidney disease Hemoglobin around baseline Daily CBC  Chronic thrombocytopenia Daily CBC      Estimated body mass index is 25.31 kg/m as calculated from the following:   Height as of this encounter: 5\' 7"  (1.702 m).   Weight as of this encounter: 73.3 kg.     Code Status: Full  Family Communication: Discussed with daughter at bedside  Disposition Plan: Status is: Inpatient  Remains inpatient appropriate because:Inpatient level of care appropriate due to severity of illness   Dispo: The patient is from: Home              Anticipated d/c is to: SNF              Patient currently is not medically stable to d/c.   Difficult to place patient No    Consultants:  Nephrology  Procedures:  None  Antimicrobials:  Cefepime  Stopped Vancomycin on 10/29/20  DVT prophylaxis: [place] heparin   Objective: Vitals:  10/29/20 2048 10/30/20 0000 10/30/20 0500 10/30/20 0939  BP: (!) 144/90 (!) 138/56 (!) 142/75 (!) 151/52  Pulse: 84 80 77 76  Resp: 18 18 20    Temp: 98.2 F (36.8 C) 98.1 F (36.7 C) 98.4 F (36.9 C) 98 F (36.7 C)  TempSrc: Oral Oral Oral Oral  SpO2: 97% 95% 94% 100%  Weight:      Height:        Intake/Output Summary (Last 24 hours) at 10/30/2020 1547 Last data filed  at 10/30/2020 1235 Gross per 24 hour  Intake 1038.49 ml  Output 3016 ml  Net -1977.51 ml   Filed Weights   10/27/20 1752 10/29/20 1205 10/29/20 1609  Weight: 75.2 kg 76.5 kg 73.3 kg    Exam:  General: NAD  Cardiovascular: S1, S2 present  Respiratory:  Diminished breath sounds bilaterally  Abdomen: Soft, nontender, nondistended, bowel sounds present  Musculoskeletal: No bilateral pedal edema noted  Skin: Normal  Psychiatry: Normal mood   Data Reviewed: CBC: Recent Labs  Lab 10/25/20 1925 10/26/20 0449 10/27/20 0334 10/28/20 0436 10/29/20 0342 10/30/20 0438  WBC 18.1* 20.6* 24.5* 20.7* 14.3* 11.1*  NEUTROABS 15.9*  --  21.0* 17.4* 11.3* 7.9*  HGB 11.8* 10.9* 11.1* 11.2* 10.4* 11.0*  HCT 34.6* 31.7* 32.4* 31.5* 30.3* 32.4*  MCV 93.0 92.2 91.3 89.0 88.9 90.8  PLT 130* 90* 104* 124* 137* 789   Basic Metabolic Panel: Recent Labs  Lab 10/26/20 0226 10/27/20 0334 10/28/20 0436 10/29/20 0342 10/30/20 0438  NA 129* 133* 134* 133* 133*  K 5.6* 4.5 4.0 4.3 3.9  CL 91* 94* 92* 95* 94*  CO2 21* 23 28 25 30   GLUCOSE 354* 146* 141* 190* 178*  BUN 85* 43* 36* 53* 24*  CREATININE 11.02* 7.15* 6.17* 8.25* 4.27*  CALCIUM 7.7* 7.8* 8.1* 8.0* 8.3*  PHOS  --  4.7* 3.7 3.0 2.8   GFR: Estimated Creatinine Clearance: 10 mL/min (A) (by C-G formula based on SCr of 4.27 mg/dL (H)). Liver Function Tests: Recent Labs  Lab 10/26/20 0226 10/27/20 0334 10/28/20 0436 10/29/20 0342 10/30/20 0438  AST 42*  --   --   --   --   ALT 26  --   --   --   --   ALKPHOS 110  --   --   --   --   BILITOT 0.8  --   --   --   --   PROT 7.7  --   --   --   --   ALBUMIN 3.5 2.7* 2.5* 2.5* 2.7*   No results for input(s): LIPASE, AMYLASE in the last 168 hours. No results for input(s): AMMONIA in the last 168 hours. Coagulation Profile: No results for input(s): INR, PROTIME in the last 168 hours. Cardiac Enzymes: No results for input(s): CKTOTAL, CKMB, CKMBINDEX, TROPONINI in the last  168 hours. BNP (last 3 results) No results for input(s): PROBNP in the last 8760 hours. HbA1C: No results for input(s): HGBA1C in the last 72 hours. CBG: Recent Labs  Lab 10/29/20 1120 10/29/20 1701 10/29/20 2152 10/30/20 0651 10/30/20 1134  GLUCAP 218* 156* 219* 231* 221*   Lipid Profile: No results for input(s): CHOL, HDL, LDLCALC, TRIG, CHOLHDL, LDLDIRECT in the last 72 hours. Thyroid Function Tests: No results for input(s): TSH, T4TOTAL, FREET4, T3FREE, THYROIDAB in the last 72 hours. Anemia Panel: No results for input(s): VITAMINB12, FOLATE, FERRITIN, TIBC, IRON, RETICCTPCT in the last 72 hours. Urine analysis:    Component Value Date/Time  COLORURINE YELLOW 10/15/2017 2244   APPEARANCEUR HAZY (A) 10/15/2017 2244   LABSPEC 1.015 10/15/2017 2244   PHURINE 7.0 10/15/2017 2244   GLUCOSEU >=500 (A) 10/15/2017 2244   HGBUR SMALL (A) 10/15/2017 2244   BILIRUBINUR NEGATIVE 10/15/2017 2244   KETONESUR NEGATIVE 10/15/2017 2244   PROTEINUR 100 (A) 10/15/2017 2244   UROBILINOGEN 0.2 11/19/2014 0909   NITRITE NEGATIVE 10/15/2017 2244   LEUKOCYTESUR MODERATE (A) 10/15/2017 2244   Sepsis Labs: @LABRCNTIP (procalcitonin:4,lacticidven:4)  ) Recent Results (from the past 240 hour(s))  Resp Panel by RT-PCR (Flu A&B, Covid) Nasopharyngeal Swab     Status: None   Collection Time: 10/25/20  7:26 PM   Specimen: Nasopharyngeal Swab; Nasopharyngeal(NP) swabs in vial transport medium  Result Value Ref Range Status   SARS Coronavirus 2 by RT PCR NEGATIVE NEGATIVE Final    Comment: (NOTE) SARS-CoV-2 target nucleic acids are NOT DETECTED.  The SARS-CoV-2 RNA is generally detectable in upper respiratory specimens during the acute phase of infection. The lowest concentration of SARS-CoV-2 viral copies this assay can detect is 138 copies/mL. A negative result does not preclude SARS-Cov-2 infection and should not be used as the sole basis for treatment or other patient management  decisions. A negative result may occur with  improper specimen collection/handling, submission of specimen other than nasopharyngeal swab, presence of viral mutation(s) within the areas targeted by this assay, and inadequate number of viral copies(<138 copies/mL). A negative result must be combined with clinical observations, patient history, and epidemiological information. The expected result is Negative.  Fact Sheet for Patients:  EntrepreneurPulse.com.au  Fact Sheet for Healthcare Providers:  IncredibleEmployment.be  This test is no t yet approved or cleared by the [place] FDA and  has been authorized for detection and/or diagnosis of SARS-CoV-2 by FDA under an Emergency Use Authorization (EUA). This EUA will remain  in effect (meaning this test can be used) for the duration of the COVID-19 declaration under Section 564(b)(1) of the Act, 21 U.S.C.section 360bbb-3(b)(1), unless the authorization is terminated  or revoked sooner.       Influenza A by PCR NEGATIVE NEGATIVE Final   Influenza B by PCR NEGATIVE NEGATIVE Final    Comment: (NOTE) The Xpert Xpress SARS-CoV-2/FLU/RSV plus assay is intended as an aid in the diagnosis of influenza from Nasopharyngeal swab specimens and should not be used as a sole basis for treatment. Nasal washings and aspirates are unacceptable for Xpert Xpress SARS-CoV-2/FLU/RSV testing.  Fact Sheet for Patients: EntrepreneurPulse.com.au  Fact Sheet for Healthcare Providers: IncredibleEmployment.be  This test is not yet approved or cleared by the [place] FDA and has been authorized for detection and/or diagnosis of SARS-CoV-2 by FDA under an Emergency Use Authorization (EUA). This EUA will remain in effect (meaning this test can be used) for the duration of the COVID-19 declaration under Section 564(b)(1) of the Act, 21 U.S.C. section 360bbb-3(b)(1), unless the  authorization is terminated or revoked.  Performed at [facility], [place] [address] [place], [place] [address]   Culture, blood (Routine X 2) w Reflex to ID Panel     Status: None (Preliminary result)   Collection Time: 10/26/20  4:49 AM   Specimen: BLOOD RIGHT FOREARM  Result Value Ref Range Status   Specimen Description BLOOD RIGHT FOREARM  Final   Special Requests   Final    BOTTLES DRAWN AEROBIC AND ANAEROBIC Blood Culture adequate volume   Culture   Final    NO GROWTH 4 DAYS Performed at [facility], [place] [address] [place], [place] [address]    Report Status PENDING  Incomplete  Culture, blood (Routine X 2) w Reflex to ID Panel     Status: None (Preliminary result)   Collection Time: 10/26/20  4:54 AM   Specimen: BLOOD RIGHT HAND  Result Value Ref Range Status   Specimen Description BLOOD RIGHT HAND  Final   Special Requests   Final    BOTTLES DRAWN AEROBIC AND ANAEROBIC Blood Culture adequate volume   Culture   Final    NO GROWTH 4 DAYS Performed at [facility], [place] [address] [place], [place] [address]    Report Status PENDING  Incomplete  MRSA PCR Screening     Status: None   Collection Time: 10/27/20  1:56 PM   Specimen: Nasopharyngeal  Result Value Ref Range Status   MRSA by PCR NEGATIVE NEGATIVE Final    Comment:        The GeneXpert MRSA Assay (FDA approved for NASAL specimens only), is one component of a comprehensive MRSA colonization surveillance program. It is not intended to diagnose MRSA infection nor to guide or monitor treatment for MRSA infections. Performed at [facility], [place] [address], [place], [place] [address]       Studies: No results found.  Scheduled Meds: . aspirin  162 mg Oral Daily  . Chlorhexidine Gluconate Cloth  6 each Topical Q0600  . dextromethorphan-guaiFENesin  1 tablet Oral BID  . doxercalciferol  2 mcg Intravenous Q M,W,F-HD  . heparin injection (subcutaneous)  5,000 Units Subcutaneous Q8H   . insulin aspart  0-6 Units Subcutaneous TID WC  . insulin glargine  7 Units Subcutaneous QHS  . labetalol  100 mg Oral BID  . multivitamin  1 tablet Oral QHS  . pentoxifylline  400 mg Oral Q breakfast  . sevelamer carbonate  800 mg Oral TID WC    Continuous Infusions: . ceFEPime (MAXIPIME) IV 2 g (10/29/20 1959)     LOS: 4 days     Alma Friendly, MD [facility]  If 7PM-7AM, please contact night-coverage [email or web address] 10/30/2020, 3:47 PM

## 2020-10-30 NOTE — Progress Notes (Signed)
Subjective: Patient continues to feel better today.  Shortness of breath improved.  The only thing that bothers her is having a cough.  Objective Vital signs in last 24 hours: Vitals:   10/29/20 2048 10/30/20 0000 10/30/20 0500 10/30/20 0939  BP: (!) 144/90 (!) 138/56 (!) 142/75 (!) 151/52  Pulse: 84 80 77 76  Resp: 18 18 20    Temp: 98.2 F (36.8 C) 98.1 F (36.7 C) 98.4 F (36.9 C) 98 F (36.7 C)  TempSrc: Oral Oral Oral Oral  SpO2: 97% 95% 94% 100%  Weight:      Height:       Weight change:   Intake/Output Summary (Last 24 hours) at 10/30/2020 1033 Last data filed at 10/29/2020 2200 Gross per 24 hour  Intake 1038.49 ml  Output 3016 ml  Net -1977.51 ml    Dialysis Orders:  MWF-[facility] in [place]  4hrs, BFR 400, DFR 500,  EDW 81kg, 2K/ 2.5Ca  Access: RUE AVF Heparin: none Epogen 2000 units IV push 3x qw Hectorol 54mcg IV 3x qw Home meds: Renvela 800mg  3 tabs po tid with meals and 2 with snacks  Last Labs: Hgb 10.9, K 5.6, Ca 7.7, Alb 3.5  Assessment/Plan: 1.  Possible HCAP: WBC elevated on admission. BC cultures negative. COVID and Flu (-). Noted patient with expiratory wheezing. CXR showed developing opacities.  Treatment per primary team.  Overall improving 2.  ESRD -  On HD MWF-received extra treatment this past weekend.  Likely helping pulmonary status.  Continue dialysis tomorrow per schedule. 3.  Hypertension/volume  -aggressive ultrafiltration with dialysis has improved blood pressure.  Needs new dry weight outpatient 4.  Anemia of CKD -hemoglobin acceptable at 11 5.  Secondary Hyperparathyroidism -  CorrCa 8.1- cont Hectorol.  PO4 OK on low dose renvela.- no change 6.  Nutrition - Continue renal diet with fluid restriction 7. Hyperkalemia-  resolved with dialysis     Reesa Chew    Labs: Basic Metabolic Panel: Recent Labs  Lab 10/28/20 0436 10/29/20 0342 10/30/20 0438  NA 134* 133* 133*  K 4.0 4.3 3.9  CL 92* 95* 94*  CO2 28 25 30    GLUCOSE 141* 190* 178*  BUN 36* 53* 24*  CREATININE 6.17* 8.25* 4.27*  CALCIUM 8.1* 8.0* 8.3*  PHOS 3.7 3.0 2.8   Liver Function Tests: Recent Labs  Lab 10/26/20 0226 10/27/20 0334 10/28/20 0436 10/29/20 0342 10/30/20 0438  AST 42*  --   --   --   --   ALT 26  --   --   --   --   ALKPHOS 110  --   --   --   --   BILITOT 0.8  --   --   --   --   PROT 7.7  --   --   --   --   ALBUMIN 3.5   < > 2.5* 2.5* 2.7*   < > = values in this interval not displayed.   No results for input(s): LIPASE, AMYLASE in the last 168 hours. No results for input(s): AMMONIA in the last 168 hours. CBC: Recent Labs  Lab 10/26/20 0449 10/27/20 0334 10/28/20 0436 10/29/20 0342 10/30/20 0438  WBC 20.6* 24.5* 20.7* 14.3* 11.1*  NEUTROABS  --  21.0* 17.4* 11.3* 7.9*  HGB 10.9* 11.1* 11.2* 10.4* 11.0*  HCT 31.7* 32.4* 31.5* 30.3* 32.4*  MCV 92.2 91.3 89.0 88.9 90.8  PLT 90* 104* 124* 137* 154   Cardiac Enzymes: No results for input(s):  CKTOTAL, CKMB, CKMBINDEX, TROPONINI in the last 168 hours. CBG: Recent Labs  Lab 10/29/20 0634 10/29/20 1120 10/29/20 1701 10/29/20 2152 10/30/20 0651  GLUCAP 195* 218* 156* 219* 231*    Iron Studies: No results for input(s): IRON, TIBC, TRANSFERRIN, FERRITIN in the last 72 hours. Studies/Results: No results found. Medications: Infusions: . ceFEPime (MAXIPIME) IV 2 g (10/29/20 1959)    Scheduled Medications: . aspirin  162 mg Oral Daily  . Chlorhexidine Gluconate Cloth  6 each Topical Q0600  . dextromethorphan-guaiFENesin  1 tablet Oral BID  . doxercalciferol  2 mcg Intravenous Q M,W,F-HD  . heparin injection (subcutaneous)  5,000 Units Subcutaneous Q8H  . insulin aspart  0-6 Units Subcutaneous TID WC  . insulin glargine  7 Units Subcutaneous QHS  . labetalol  100 mg Oral BID  . multivitamin  1 tablet Oral QHS  . pentoxifylline  400 mg Oral Q breakfast  . sevelamer carbonate  800 mg Oral TID WC    have reviewed scheduled and prn  medications.  Physical Exam: General: Lying in bed with no distress Heart: RRR Lungs: Expiratory wheezing present, no increased work of breathing Abdomen: soft, non tender Extremities: Warm and well perfused Dialysis Access: right AVF- patent    10/30/2020,10:33 AM  LOS: 4 days

## 2020-10-31 DIAGNOSIS — E118 Type 2 diabetes mellitus with unspecified complications: Secondary | ICD-10-CM | POA: Diagnosis not present

## 2020-10-31 DIAGNOSIS — R778 Other specified abnormalities of plasma proteins: Secondary | ICD-10-CM | POA: Diagnosis not present

## 2020-10-31 DIAGNOSIS — N186 End stage renal disease: Secondary | ICD-10-CM | POA: Diagnosis not present

## 2020-10-31 DIAGNOSIS — J9601 Acute respiratory failure with hypoxia: Secondary | ICD-10-CM | POA: Diagnosis not present

## 2020-10-31 LAB — CBC WITH DIFFERENTIAL/PLATELET
Abs Immature Granulocytes: 0.37 10*3/uL — ABNORMAL HIGH (ref 0.00–0.07)
Basophils Absolute: 0 10*3/uL (ref 0.0–0.1)
Basophils Relative: 0 %
Eosinophils Absolute: 0.3 10*3/uL (ref 0.0–0.5)
Eosinophils Relative: 3 %
HCT: 31.4 % — ABNORMAL LOW (ref 36.0–46.0)
Hemoglobin: 10.6 g/dL — ABNORMAL LOW (ref 12.0–15.0)
Immature Granulocytes: 4 %
Lymphocytes Relative: 17 %
Lymphs Abs: 1.5 10*3/uL (ref 0.7–4.0)
MCH: 30.8 pg (ref 26.0–34.0)
MCHC: 33.8 g/dL (ref 30.0–36.0)
MCV: 91.3 fL (ref 80.0–100.0)
Monocytes Absolute: 1.3 10*3/uL — ABNORMAL HIGH (ref 0.1–1.0)
Monocytes Relative: 15 %
Neutro Abs: 5.2 10*3/uL (ref 1.7–7.7)
Neutrophils Relative %: 61 %
Platelets: 149 10*3/uL — ABNORMAL LOW (ref 150–400)
RBC: 3.44 MIL/uL — ABNORMAL LOW (ref 3.87–5.11)
RDW: 14.4 % (ref 11.5–15.5)
WBC: 8.5 10*3/uL (ref 4.0–10.5)
nRBC: 0 % (ref 0.0–0.2)

## 2020-10-31 LAB — RENAL FUNCTION PANEL
Albumin: 2.6 g/dL — ABNORMAL LOW (ref 3.5–5.0)
Anion gap: 11 (ref 5–15)
BUN: 37 mg/dL — ABNORMAL HIGH (ref 8–23)
CO2: 27 mmol/L (ref 22–32)
Calcium: 8.1 mg/dL — ABNORMAL LOW (ref 8.9–10.3)
Chloride: 93 mmol/L — ABNORMAL LOW (ref 98–111)
Creatinine, Ser: 6.93 mg/dL — ABNORMAL HIGH (ref 0.44–1.00)
GFR, Estimated: 6 mL/min — ABNORMAL LOW (ref >60)
Glucose, Bld: 336 mg/dL — ABNORMAL HIGH (ref 70–99)
Phosphorus: 3 mg/dL (ref 2.5–4.6)
Potassium: 3.9 mmol/L (ref 3.5–5.1)
Sodium: 131 mmol/L — ABNORMAL LOW (ref 135–145)

## 2020-10-31 LAB — GLUCOSE, CAPILLARY
Glucose-Capillary: 290 mg/dL — ABNORMAL HIGH (ref 70–99)
Glucose-Capillary: 168 mg/dL — ABNORMAL HIGH (ref 70–99)
Glucose-Capillary: 124 mg/dL — ABNORMAL HIGH (ref 70–99)
Glucose-Capillary: 114 mg/dL — ABNORMAL HIGH (ref 70–99)
Glucose-Capillary: 284 mg/dL — ABNORMAL HIGH (ref 70–99)
Glucose-Capillary: 170 mg/dL — ABNORMAL HIGH (ref 70–99)

## 2020-10-31 LAB — CULTURE, BLOOD (ROUTINE X 2)
Culture: NO GROWTH
Culture: NO GROWTH
Special Requests: ADEQUATE
Special Requests: ADEQUATE

## 2020-10-31 LAB — HEMOGLOBIN A1C
Hgb A1c MFr Bld: 9.4 % — ABNORMAL HIGH (ref 4.8–5.6)
Mean Plasma Glucose: 223.08 mg/dL

## 2020-10-31 MED ORDER — LABETALOL HCL 100 MG PO TABS: 100.0000 mg | ORAL_TABLET | Freq: Two times a day (BID) | ORAL | Status: DC

## 2020-10-31 MED ORDER — INSULIN GLARGINE 100 UNIT/ML ~~LOC~~ SOLN
3.0000 [IU] | Freq: Two times a day (BID) | SUBCUTANEOUS | Status: DC
  Administered 2020-10-31 – 2020-11-04 (×9): 3 [IU] via SUBCUTANEOUS
  Filled 2020-10-31 (×11): qty 0.03

## 2020-10-31 MED ORDER — INSULIN ASPART 100 UNIT/ML IJ SOLN
0.0000 [IU] | Freq: Every day | INTRAMUSCULAR | Status: DC
  Administered 2020-11-01: 2 [IU] via SUBCUTANEOUS
  Administered 2020-11-04 – 2020-11-05 (×2): 3 [IU] via SUBCUTANEOUS

## 2020-10-31 MED ORDER — INSULIN ASPART 100 UNIT/ML IJ SOLN
2.0000 [IU] | Freq: Three times a day (TID) | INTRAMUSCULAR | Status: DC
  Administered 2020-10-31 – 2020-11-07 (×14): 2 [IU] via SUBCUTANEOUS

## 2020-10-31 MED ORDER — DOXERCALCIFEROL 4 MCG/2ML IV SOLN
INTRAVENOUS | Status: AC
  Administered 2020-10-31: 2 ug via INTRAVENOUS
  Filled 2020-10-31: qty 2

## 2020-10-31 MED ORDER — INSULIN ASPART 100 UNIT/ML IJ SOLN
0.0000 [IU] | Freq: Three times a day (TID) | INTRAMUSCULAR | Status: DC
  Administered 2020-10-31: 3 [IU] via SUBCUTANEOUS
  Administered 2020-11-01 – 2020-11-02 (×2): 2 [IU] via SUBCUTANEOUS
  Administered 2020-11-02: 1 [IU] via SUBCUTANEOUS
  Administered 2020-11-04 – 2020-11-06 (×5): 2 [IU] via SUBCUTANEOUS
  Administered 2020-11-06 – 2020-11-07 (×3): 1 [IU] via SUBCUTANEOUS
  Administered 2020-11-07: 2 [IU] via SUBCUTANEOUS
  Administered 2020-11-08: 3 [IU] via SUBCUTANEOUS
  Administered 2020-11-08: 1 [IU] via SUBCUTANEOUS

## 2020-10-31 MED ORDER — ONDANSETRON HCL 4 MG/2ML IJ SOLN
INTRAMUSCULAR | Status: AC
  Administered 2020-10-31: 4 mg via INTRAVENOUS
  Filled 2020-10-31: qty 2

## 2020-10-31 NOTE — Progress Notes (Signed)
Subjective: Patient more confused today.  Seen on dialysis.  Flocculating and nonsensical speech  Objective Vital signs in last 24 hours: Vitals:   10/31/20 0930 10/31/20 1000 10/31/20 1030 10/31/20 1100  BP: (!) 149/49 (!) 118/47 (!) 97/52 (!) 129/57  Pulse:  71    Resp: 17 16 20    Temp:      TempSrc:      SpO2: 97% 97% 96%   Weight:      Height:       Weight change: -0.5 kg  Intake/Output Summary (Last 24 hours) at 10/31/2020 1118 Last data filed at 10/30/2020 1235 Gross per 24 hour  Intake 120 ml  Output 0 ml  Net 120 ml    Dialysis Orders:  MWF-[facility] in [place]  4hrs, BFR 400, DFR 500,  EDW 81kg, 2K/ 2.5Ca  Access: RUE AVF Heparin: none Epogen 2000 units IV push 3x qw Hectorol 33mcg IV 3x qw Home meds: Renvela 800mg  3 tabs po tid with meals and 2 with snacks  Last Labs: Hgb 10.9, K 5.6, Ca 7.7, Alb 3.5  Assessment/Plan: 1. Possible HCAP: WBC elevated on admission. BC cultures negative. COVID and Flu (-). Noted patient with expiratory wheezing. CXR showed developing opacities.  Treatment per primary team.  Overall improving 2.  ESRD -  On HD MWF-received extra treatment this past weekend.  Likely helping pulmonary status.  Continue dialysis per schedule. 3.  Hypertension/volume  -aggressive ultrafiltration with dialysis has improved blood pressure.  Needs new dry weight outpatient. Most recently was 81kg and got to 73kg last treatment 4.  Anemia of CKD -hemoglobin acceptable at 11 5.  Secondary Hyperparathyroidism -  CorrCa 8.1- cont Hectorol.  PO4 OK on low dose renvela.- no change 6.  Nutrition - Continue renal diet with fluid restriction 7. Hyperkalemia-  resolved with dialysis 8. Delirium: appears delirious today. Unclear cause CTM     Reesa Chew    Labs: Basic Metabolic Panel: Recent Labs  Lab 10/29/20 0342 10/30/20 0438 10/31/20 0408  NA 133* 133* 131*  K 4.3 3.9 3.9  CL 95* 94* 93*  CO2 25 30 27   GLUCOSE 190* 178* 336*  BUN 53* 24*  37*  CREATININE 8.25* 4.27* 6.93*  CALCIUM 8.0* 8.3* 8.1*  PHOS 3.0 2.8 3.0   Liver Function Tests: Recent Labs  Lab 10/26/20 0226 10/27/20 0334 10/29/20 0342 10/30/20 0438 10/31/20 0408  AST 42*  --   --   --   --   ALT 26  --   --   --   --   ALKPHOS 110  --   --   --   --   BILITOT 0.8  --   --   --   --   PROT 7.7  --   --   --   --   ALBUMIN 3.5   < > 2.5* 2.7* 2.6*   < > = values in this interval not displayed.   No results for input(s): LIPASE, AMYLASE in the last 168 hours. No results for input(s): AMMONIA in the last 168 hours. CBC: Recent Labs  Lab 10/27/20 0334 10/28/20 0436 10/29/20 0342 10/30/20 0438 10/31/20 0408  WBC 24.5* 20.7* 14.3* 11.1* 8.5  NEUTROABS 21.0* 17.4* 11.3* 7.9* 5.2  HGB 11.1* 11.2* 10.4* 11.0* 10.6*  HCT 32.4* 31.5* 30.3* 32.4* 31.4*  MCV 91.3 89.0 88.9 90.8 91.3  PLT 104* 124* 137* 154 149*   Cardiac Enzymes: No results for input(s): CKTOTAL, CKMB, CKMBINDEX, TROPONINI in the  last 168 hours. CBG: Recent Labs  Lab 10/30/20 1134 10/30/20 1636 10/30/20 2251 10/31/20 0651 10/31/20 0930  GLUCAP 221* 143* 243* 290* 168*    Iron Studies: No results for input(s): IRON, TIBC, TRANSFERRIN, FERRITIN in the last 72 hours. Studies/Results: No results found. Medications: Infusions: . ceFEPime (MAXIPIME) IV 2 g (10/29/20 1959)    Scheduled Medications: . aspirin  162 mg Oral Daily  . dextromethorphan-guaiFENesin  1 tablet Oral BID  . doxercalciferol  2 mcg Intravenous Q M,W,F-HD  . heparin injection (subcutaneous)  5,000 Units Subcutaneous Q8H  . insulin aspart  0-6 Units Subcutaneous TID WC  . insulin glargine  7 Units Subcutaneous QHS  . labetalol  100 mg Oral BID  . multivitamin  1 tablet Oral QHS  . pentoxifylline  400 mg Oral Q breakfast  . sevelamer carbonate  800 mg Oral TID WC    have reviewed scheduled and prn medications.  Physical Exam: General: Lying in bed with no distress but confused and aggitated Heart:  RRR Lungs: Expiratory wheezing present, no increased work of breathing Abdomen: soft, non tender Extremities: Warm and well perfused Dialysis Access: right AVF- patent    10/31/2020,11:18 AM  LOS: 5 days

## 2020-10-31 NOTE — Progress Notes (Signed)
Renal Navigator notes Therapy recommendations for SNF. Navigator spoke with patient's outpatient HD clinic/[facility], who states patient's seat schedule is MWF 5:45am (5:30am arrival). Navigator informed TOC CSW/V. Crawford and will continue to follow.   Alphonzo Cruise, [place] Renal Navigator [phone]

## 2020-10-31 NOTE — Procedures (Signed)
I was present at this dialysis session. I have reviewed the session itself and made appropriate changes.   Patient was agitated and appeared delirious with floculations. If worsened during HD may stop early although I don't think HD is the cause. No hypotension  Filed Weights   10/29/20 1609 10/30/20 2249 10/31/20 0810  Weight: 73.3 kg 76 kg 75.5 kg    Recent Labs  Lab 10/31/20 0408  NA 131*  K 3.9  CL 93*  CO2 27  GLUCOSE 336*  BUN 37*  CREATININE 6.93*  CALCIUM 8.1*  PHOS 3.0    Recent Labs  Lab 10/29/20 0342 10/30/20 0438 10/31/20 0408  WBC 14.3* 11.1* 8.5  NEUTROABS 11.3* 7.9* 5.2  HGB 10.4* 11.0* 10.6*  HCT 30.3* 32.4* 31.4*  MCV 88.9 90.8 91.3  PLT 137* 154 149*    Scheduled Meds: . aspirin  162 mg Oral Daily  . dextromethorphan-guaiFENesin  1 tablet Oral BID  . doxercalciferol  2 mcg Intravenous Q M,W,F-HD  . heparin injection (subcutaneous)  5,000 Units Subcutaneous Q8H  . insulin aspart  0-6 Units Subcutaneous TID WC  . insulin glargine  7 Units Subcutaneous QHS  . labetalol  100 mg Oral BID  . multivitamin  1 tablet Oral QHS  . pentoxifylline  400 mg Oral Q breakfast  . sevelamer carbonate  800 mg Oral TID WC   Continuous Infusions: . ceFEPime (MAXIPIME) IV 2 g (10/29/20 1959)   PRN Meds:.acetaminophen **OR** acetaminophen, albuterol, benzonatate, HYDROcodone-acetaminophen, ipratropium-albuterol, labetalol, ondansetron **OR** ondansetron (ZOFRAN) IV   Santiago Bumpers,  MD 10/31/2020, 11:25 AM

## 2020-10-31 NOTE — Plan of Care (Signed)
  Problem: Safety: Goal: Ability to remain free from injury will improve Outcome: Progressing   

## 2020-10-31 NOTE — Progress Notes (Signed)
[facility] PROGRESS NOTE    Progress Note  [name]  DGL:[id number] DOB: 04-15-1941 DOA: 10/25/2020 PCP: [name], MD     Brief Narrative:   [name] is an 80 y.o. female past medical history significant for stage renal disease on hemodialysis, essential hypertension stroke comes in complaining of chest pain and shortness of breath, she apparently missed her last dialysis treatment in the ED was found to be tachycardic with a respiratory rate of 22 satting 90% on 2 L with a blood pressure of 243/104 chest x-ray shows no acute findings.   Assessment/Plan:   Acute respiratory failure with hypoxia (HCC) due to volume overload in the setting of noncompliance with her dialysis/hyperkalemia/hyponatremia: Her home medications were continued nephrology was consulted and further dialysis and fluid management per renal. She still appears significantly fluid overloaded on physical exam elevated JVD and crackles bilaterally. She is -2 L on dialysis.  Probably tolerate a lot more.  HCAP: She reported a productive cough with white count of 18 on admission blood cultures remain negative till date. SARS-CoV-2 PCR and flu remain negative.  No chest pain: Likely due to volume overload versus pneumonia currently chest pain-free.  Hypertensive urgency: Improving with HD continue IV labetalol, continue hemodialysis. We will resume her home regimen.  Uncontrolled diabetes mellitus with hyperglycemia: Currently on long-acting insulin plus sliding scale, her blood glucose improved continues to be high. Will change her sliding scale continue long-acting insulin.  Anemia of chronic disease: Further management per renal.  Chronic thrombocytopenia: Platelets stable continue to monitor intermittently.   DVT prophylaxis: lovenox Family Communication:none Status is: Inpatient  Remains inpatient appropriate because:Hemodynamically unstable   Dispo: The patient is  from: Home              Anticipated d/c is to: Home              Patient currently is not medically stable to d/c.   Difficult to place patient No Code Status:     Code Status Orders  (From admission, onward)         Start     Ordered   10/26/20 0504  Full code  Continuous        10/26/20 0503        Code Status History    Date Active Date Inactive Code Status Order ID Comments User Context   01/05/2020 1904 01/17/2020 2236 Full Code [id number]  Lenore Cordia, MD ED   10/15/2017 0849 10/16/2017 2318 Full Code [id number]  Oswald Hillock, MD ED   05/29/2015 1916 06/03/2015 2048 Full Code [id number]  Willia Craze, NP ED   08/23/2013 1948 08/30/2013 1711 Full Code [id number]  Cathlyn Parsons, PA-C Inpatient   08/23/2013 1948 08/23/2013 1948 Full Code [id number]  Cathlyn Parsons, PA-C Inpatient   08/19/2013 1751 08/23/2013 1948 Full Code [id number]  Orson Eva, MD Inpatient   07/20/2013 1517 07/22/2013 2017 Full Code [id number]  Verlee Monte, MD Inpatient   06/25/2013 2101 07/04/2013 2029 Full Code [id number]  Merton Border, MD Inpatient   Advance Care Planning Activity        IV Access:    Peripheral IV   Procedures and diagnostic studies:   No results found.   Medical Consultants:    None.   Subjective:    [name] she relates her breathing is unchanged compared to previous days.  Objective:    Vitals:   10/31/20 1100 10/31/20 1130 10/31/20 1200 10/31/20 1228  BP: (!) 129/57 (!) 119/53 (!) 135/55 (!) 154/59  Pulse:   73   Resp:    (!) 22  Temp:    98.2 F (36.8 C)  TempSrc:    Oral  SpO2:    98%  Weight:    73.4 kg  Height:       SpO2: 98 % O2 Flow Rate (L/min): 2 L/min   Intake/Output Summary (Last 24 hours) at 10/31/2020 1247 Last data filed at 10/31/2020 1228 Gross per 24 hour  Intake --  Output 1950 ml  Net -1950 ml   Filed Weights   10/30/20 2249 10/31/20 0810 10/31/20 1228  Weight: 76 kg 75.5 kg 73.4 kg    Exam: General exam: In no  acute distress. Respiratory system: Good air movement and crackles bilaterally Cardiovascular system: S1 & S2 heard, RRR.  Positive JVD to the earlobe Gastrointestinal system: Abdomen is nondistended, soft and nontender.  Extremities: No pedal edema. Skin: No rashes, lesions or ulcers Psychiatry: Judgement and insight appear normal. Mood & affect appropriate.    Data Reviewed:    Labs: Basic Metabolic Panel: Recent Labs  Lab 10/27/20 0334 10/28/20 0436 10/29/20 0342 10/30/20 0438 10/31/20 0408  NA 133* 134* 133* 133* 131*  K 4.5 4.0 4.3 3.9 3.9  CL 94* 92* 95* 94* 93*  CO2 23 28 25 30 27   GLUCOSE 146* 141* 190* 178* 336*  BUN 43* 36* 53* 24* 37*  CREATININE 7.15* 6.17* 8.25* 4.27* 6.93*  CALCIUM 7.8* 8.1* 8.0* 8.3* 8.1*  PHOS 4.7* 3.7 3.0 2.8 3.0   GFR Estimated Creatinine Clearance: 6.2 mL/min (A) (by C-G formula based on SCr of 6.93 mg/dL (H)). Liver Function Tests: Recent Labs  Lab 10/26/20 0226 10/27/20 0334 10/28/20 0436 10/29/20 0342 10/30/20 0438 10/31/20 0408  AST 42*  --   --   --   --   --   ALT 26  --   --   --   --   --   ALKPHOS 110  --   --   --   --   --   BILITOT 0.8  --   --   --   --   --   PROT 7.7  --   --   --   --   --   ALBUMIN 3.5 2.7* 2.5* 2.5* 2.7* 2.6*   No results for input(s): LIPASE, AMYLASE in the last 168 hours. No results for input(s): AMMONIA in the last 168 hours. Coagulation profile No results for input(s): INR, PROTIME in the last 168 hours. COVID-19 Labs  No results for input(s): DDIMER, FERRITIN, LDH, CRP in the last 72 hours.  Lab Results  Component Value Date   SARSCOV2NAA NEGATIVE 10/25/2020   [place] NEGATIVE 01/05/2020    CBC: Recent Labs  Lab 10/27/20 0334 10/28/20 0436 10/29/20 0342 10/30/20 0438 10/31/20 0408  WBC 24.5* 20.7* 14.3* 11.1* 8.5  NEUTROABS 21.0* 17.4* 11.3* 7.9* 5.2  HGB 11.1* 11.2* 10.4* 11.0* 10.6*  HCT 32.4* 31.5* 30.3* 32.4* 31.4*  MCV 91.3 89.0 88.9 90.8 91.3  PLT 104* 124*  137* 154 149*   Cardiac Enzymes: No results for input(s): CKTOTAL, CKMB, CKMBINDEX, TROPONINI in the last 168 hours. BNP (last 3 results) No results for input(s): PROBNP in the last 8760 hours. CBG: Recent Labs  Lab 10/30/20 1636 10/30/20 2251 10/31/20 0651 10/31/20 0930 10/31/20 1208  GLUCAP 143* 243* 290* 168* 124*   D-Dimer: No results for input(s): DDIMER in the last 72 hours.  Hgb A1c: No results for input(s): HGBA1C in the last 72 hours. Lipid Profile: No results for input(s): CHOL, HDL, LDLCALC, TRIG, CHOLHDL, LDLDIRECT in the last 72 hours. Thyroid function studies: No results for input(s): TSH, T4TOTAL, T3FREE, THYROIDAB in the last 72 hours.  Invalid input(s): FREET3 Anemia work up: No results for input(s): VITAMINB12, FOLATE, FERRITIN, TIBC, IRON, RETICCTPCT in the last 72 hours. Sepsis Labs: Recent Labs  Lab 10/28/20 0436 10/29/20 0342 10/30/20 0438 10/31/20 0408  WBC 20.7* 14.3* 11.1* 8.5   Microbiology Recent Results (from the past 240 hour(s))  Resp Panel by RT-PCR (Flu A&B, Covid) Nasopharyngeal Swab     Status: None   Collection Time: 10/25/20  7:26 PM   Specimen: Nasopharyngeal Swab; Nasopharyngeal(NP) swabs in vial transport medium  Result Value Ref Range Status   SARS Coronavirus 2 by RT PCR NEGATIVE NEGATIVE Final    Comment: (NOTE) SARS-CoV-2 target nucleic acids are NOT DETECTED.  The SARS-CoV-2 RNA is generally detectable in upper respiratory specimens during the acute phase of infection. The lowest concentration of SARS-CoV-2 viral copies this assay can detect is 138 copies/mL. A negative result does not preclude SARS-Cov-2 infection and should not be used as the sole basis for treatment or other patient management decisions. A negative result may occur with  improper specimen collection/handling, submission of specimen other than nasopharyngeal swab, presence of viral mutation(s) within the areas targeted by this assay, and inadequate  number of viral copies(<138 copies/mL). A negative result must be combined with clinical observations, patient history, and epidemiological information. The expected result is Negative.  Fact Sheet for Patients:  EntrepreneurPulse.com.au  Fact Sheet for Healthcare Providers:  IncredibleEmployment.be  This test is no t yet approved or cleared by the [place] FDA and  has been authorized for detection and/or diagnosis of SARS-CoV-2 by FDA under an Emergency Use Authorization (EUA). This EUA will remain  in effect (meaning this test can be used) for the duration of the COVID-19 declaration under Section 564(b)(1) of the Act, 21 U.S.C.section 360bbb-3(b)(1), unless the authorization is terminated  or revoked sooner.       Influenza A by PCR NEGATIVE NEGATIVE Final   Influenza B by PCR NEGATIVE NEGATIVE Final    Comment: (NOTE) The Xpert Xpress SARS-CoV-2/FLU/RSV plus assay is intended as an aid in the diagnosis of influenza from Nasopharyngeal swab specimens and should not be used as a sole basis for treatment. Nasal washings and aspirates are unacceptable for Xpert Xpress SARS-CoV-2/FLU/RSV testing.  Fact Sheet for Patients: EntrepreneurPulse.com.au  Fact Sheet for Healthcare Providers: IncredibleEmployment.be  This test is not yet approved or cleared by the [place] FDA and has been authorized for detection and/or diagnosis of SARS-CoV-2 by FDA under an Emergency Use Authorization (EUA). This EUA will remain in effect (meaning this test can be used) for the duration of the COVID-19 declaration under Section 564(b)(1) of the Act, 21 U.S.C. section 360bbb-3(b)(1), unless the authorization is terminated or revoked.  Performed at [facility], [place] [address], [place], [place] [address]   Culture, blood (Routine X 2) w Reflex to ID Panel     Status: None   Collection Time: 10/26/20  4:49 AM    Specimen: BLOOD RIGHT FOREARM  Result Value Ref Range Status   Specimen Description BLOOD RIGHT FOREARM  Final   Special Requests   Final    BOTTLES DRAWN AEROBIC AND ANAEROBIC Blood Culture adequate volume   Culture   Final    NO GROWTH  5 DAYS Performed at [facility], [place] [address] [place], [place] [address]    Report Status 10/31/2020 FINAL  Final  Culture, blood (Routine X 2) w Reflex to ID Panel     Status: None   Collection Time: 10/26/20  4:54 AM   Specimen: BLOOD RIGHT HAND  Result Value Ref Range Status   Specimen Description BLOOD RIGHT HAND  Final   Special Requests   Final    BOTTLES DRAWN AEROBIC AND ANAEROBIC Blood Culture adequate volume   Culture   Final    NO GROWTH 5 DAYS Performed at [facility], [place] [address] [place], [place] [address]    Report Status 10/31/2020 FINAL  Final  MRSA PCR Screening     Status: None   Collection Time: 10/27/20  1:56 PM   Specimen: Nasopharyngeal  Result Value Ref Range Status   MRSA by PCR NEGATIVE NEGATIVE Final    Comment:        The GeneXpert MRSA Assay (FDA approved for NASAL specimens only), is one component of a comprehensive MRSA colonization surveillance program. It is not intended to diagnose MRSA infection nor to guide or monitor treatment for MRSA infections. Performed at [facility], [place] [address], [place], [place] [address]      Medications:   . aspirin  162 mg Oral Daily  . dextromethorphan-guaiFENesin  1 tablet Oral BID  . doxercalciferol  2 mcg Intravenous Q M,W,F-HD  . heparin injection (subcutaneous)  5,000 Units Subcutaneous Q8H  . insulin aspart  0-6 Units Subcutaneous TID WC  . insulin glargine  7 Units Subcutaneous QHS  . labetalol  100 mg Oral BID  . multivitamin  1 tablet Oral QHS  . pentoxifylline  400 mg Oral Q breakfast  . sevelamer carbonate  800 mg Oral TID WC   Continuous Infusions: . ceFEPime (MAXIPIME) IV 2 g (10/29/20 1959)      LOS: 5 days    Charlynne Cousins  [facility]  10/31/2020, 12:47 PM

## 2020-10-31 NOTE — Progress Notes (Signed)
Occupational Therapy Treatment Patient Details Name: [name] MRN: [id number] DOB: 08/04/1940 Today's Date: 10/31/2020    History of present illness 80 y.o. female, presented to ED 10/25/20 with R sided chest pain for 1.5 weeks and shortness of breath. Chest CT showed acute abnormalities  Admitted for treatment for acute hypoxic respiratrory failure, suspected HCAP, hyperkalemia and volume overload  PMH: ESRD on HD MWF, HTN, T2DM, stroke   OT comments  Pt making slow progress with functional goals. Pt fatigued this session (had HD this morning). Session focused on bed mobility to sit EOB, requiring increased time and effort, sit - stand from EOB with RW, SPTs, side stepping to HOB, UB ADLs seated EOB and grooming. OT will continue to follow acutely to maximize level of function and safety  Follow Up Recommendations  SNF    Equipment Recommendations  3 in 1 bedside commode;Other (comment) (TBD at next venue of care)    Recommendations for Other Services      Precautions / Restrictions Precautions Precautions: Fall Restrictions Weight Bearing Restrictions: No       Mobility Bed Mobility Overal bed mobility: Needs Assistance Bed Mobility: Supine to Sit;Sit to Supine     Supine to sit: Min assist;HOB elevated Sit to supine: Min assist   General bed mobility comments: min A to manage LEs    Transfers Overall transfer level: Needs assistance Equipment used: Rolling walker (2 wheeled) Transfers: Sit to/from Stand Sit to Stand: Mod assist              Balance Overall balance assessment: Needs assistance Sitting-balance support: Feet supported;Feet unsupported;No upper extremity supported;Bilateral upper extremity supported Sitting balance-Leahy Scale: Fair     Standing balance support: Bilateral upper extremity supported;During functional activity Standing balance-Leahy Scale: Poor                             ADL either performed or assessed  with clinical judgement   ADL Overall ADL's : Needs assistance/impaired     Grooming: Wash/dry face;Wash/dry hands;Min guard;Sitting           Upper Body Dressing : Adult nurse: Moderate assistance;Stand-pivot;BSC;Cueing for safety;Cueing for sequencing;RW   Toileting- Clothing Manipulation and Hygiene: Maximal assistance;Sit to/from stand       Functional mobility during ADLs: Maximal assistance;Cueing for safety;Rolling walker General ADL Comments: pt fatigued today after HD this morning     Vision Baseline Vision/History: Wears glasses Patient Visual Report: No change from baseline     Perception     Praxis      Cognition Arousal/Alertness: Awake/alert Behavior During Therapy: WFL for tasks assessed/performed Overall Cognitive Status: Impaired/Different from baseline Area of Impairment: Orientation;Safety/judgement;Problem solving                 Orientation Level: Time;Disoriented to           Problem Solving: Slow processing;Difficulty sequencing;Requires verbal cues          Exercises     Shoulder Instructions       General Comments      Pertinent Vitals/ Pain       Pain Assessment: No/denies pain Pain Score: 0-No pain Faces Pain Scale: No hurt Pain Intervention(s): Monitored during session  Home Living  Prior Functioning/Environment              Frequency  Min 2X/week        Progress Toward Goals  OT Goals(current goals can now be found in the care plan section)  Progress towards OT goals: Progressing toward goals  Acute Rehab OT Goals Patient Stated Goal: go home  Plan Discharge plan remains appropriate    Co-evaluation                 AM-PAC OT "6 Clicks" Daily Activity     Outcome Measure   Help from another person eating meals?: None Help from another person taking care of personal grooming?: A Little Help from  another person toileting, which includes using toliet, bedpan, or urinal?: A Lot Help from another person bathing (including washing, rinsing, drying)?: A Lot Help from another person to put on and taking off regular upper body clothing?: A Little Help from another person to put on and taking off regular lower body clothing?: A Lot 6 Click Score: 16    End of Session Equipment Utilized During Treatment: Gait belt;Rolling walker  OT Visit Diagnosis: Other abnormalities of gait and mobility (R26.89);Unsteadiness on feet (R26.81);Muscle weakness (generalized) (M62.81);Other symptoms and signs involving cognitive function   Activity Tolerance Patient limited by fatigue   Patient Left in bed;with call bell/phone within reach;with bed alarm set   Nurse Communication Mobility status        Time: 0093-8182 OT Time Calculation (min): 24 min  Charges: OT General Charges $OT Visit: 1 Visit OT Treatments $Self Care/Home Management : 8-22 mins $Therapeutic Activity: 8-22 mins    Britt Bottom 10/31/2020, 2:13 PM

## 2020-10-31 NOTE — Progress Notes (Signed)
PT Cancellation Note  Patient Details Name: [name] MRN: [id number] DOB: 26-Jul-1940   Cancelled Treatment:       Pt's dinner just arrived.  She did work with OT earlier today.  Will f/u as able. Abran Richard, PT Acute Rehab Services Pager [phone] [facility] [place] 10/31/2020, 5:15 PM

## 2020-11-01 LAB — RENAL FUNCTION PANEL
Albumin: 2.7 g/dL — ABNORMAL LOW (ref 3.5–5.0)
Anion gap: 10 (ref 5–15)
BUN: 20 mg/dL (ref 8–23)
CO2: 29 mmol/L (ref 22–32)
Calcium: 8.6 mg/dL — ABNORMAL LOW (ref 8.9–10.3)
Chloride: 94 mmol/L — ABNORMAL LOW (ref 98–111)
Creatinine, Ser: 4.78 mg/dL — ABNORMAL HIGH (ref 0.44–1.00)
GFR, Estimated: 9 mL/min — ABNORMAL LOW (ref >60)
Glucose, Bld: 224 mg/dL — ABNORMAL HIGH (ref 70–99)
Phosphorus: 2.6 mg/dL (ref 2.5–4.6)
Potassium: 3.8 mmol/L (ref 3.5–5.1)
Sodium: 133 mmol/L — ABNORMAL LOW (ref 135–145)

## 2020-11-01 LAB — CBC WITH DIFFERENTIAL/PLATELET
Abs Immature Granulocytes: 0 10*3/uL (ref 0.00–0.07)
Basophils Absolute: 0.1 10*3/uL (ref 0.0–0.1)
Basophils Relative: 1 %
Eosinophils Absolute: 0.3 10*3/uL (ref 0.0–0.5)
Eosinophils Relative: 3 %
HCT: 33.2 % — ABNORMAL LOW (ref 36.0–46.0)
Hemoglobin: 11.2 g/dL — ABNORMAL LOW (ref 12.0–15.0)
Lymphocytes Relative: 12 %
Lymphs Abs: 1.2 10*3/uL (ref 0.7–4.0)
MCH: 30.9 pg (ref 26.0–34.0)
MCHC: 33.7 g/dL (ref 30.0–36.0)
MCV: 91.5 fL (ref 80.0–100.0)
Monocytes Absolute: 1 10*3/uL (ref 0.1–1.0)
Monocytes Relative: 10 %
Neutro Abs: 7.7 10*3/uL (ref 1.7–7.7)
Neutrophils Relative %: 74 %
Platelets: 160 10*3/uL (ref 150–400)
RBC: 3.63 MIL/uL — ABNORMAL LOW (ref 3.87–5.11)
RDW: 14.6 % (ref 11.5–15.5)
WBC: 10.4 10*3/uL (ref 4.0–10.5)
nRBC: 0 % (ref 0.0–0.2)
nRBC: 0 /100 WBC

## 2020-11-01 LAB — GLUCOSE, CAPILLARY
Glucose-Capillary: 223 mg/dL — ABNORMAL HIGH (ref 70–99)
Glucose-Capillary: 75 mg/dL (ref 70–99)
Glucose-Capillary: 142 mg/dL — ABNORMAL HIGH (ref 70–99)
Glucose-Capillary: 242 mg/dL — ABNORMAL HIGH (ref 70–99)

## 2020-11-01 NOTE — TOC Progression Note (Addendum)
Transition of Care [facility]) - Progression Note    Patient Details  Name: [name] MRN: [id number] Date of Birth: 06/08/1941  Transition [facility]) CM/SW Contact  Sharlet Salina Mila Homer, LCSW Phone Number: 11/01/2020, 2:48 PM  Clinical Narrative: [facility] was contacted and can accept patient if family transports to dialysis or they pay for transport. [name] contacted [phone]) and family will continue to transport patient to dialysis. Daughter explained that she is the primary person that transports her mother.  Insurance authorization initiated through [employer] with reference [id number].  Received insurance authorization: Josem Kaufmann [id number]; Effective dates: 5/19-5/23. Next review date: 11/05/20.   CSW informed that patient will discharge tomorrow, Friday, 5/20. Contacted Carrie, admissions SW and update provided.      Expected Discharge Plan: Skilled Nursing Facility Barriers to Discharge: Insurance Authorization,SNF Pending bed offer  Expected Discharge Plan and Services Expected Discharge Plan: [place] In-house Referral: Clinical Social Work     Living arrangements for the past 2 months: Single Family Home                                     Social Determinants of Health (SDOH) Interventions    Readmission Risk Interventions Readmission Risk Prevention Plan 01/17/2020  Transportation Screening Complete  PCP or Specialist Appt within 5-7 Days Not Complete  Not Complete comments pending disposition  Home Care Screening Complete  Medication Review (RN CM) Referral to Pharmacy  Some recent data might be hidden

## 2020-11-01 NOTE — Progress Notes (Signed)
Subjective: Sent delirium today.  Continues to be volume overloaded.  Daughter states that patient has had persistent delirium since yesterday.  Patient minimally interactive  Objective Vital signs in last 24 hours: Vitals:   10/31/20 1623 10/31/20 2014 11/01/20 0406 11/01/20 0913  BP: (!) 112/52 129/60 (!) 135/56 (!) 103/45  Pulse: 75 72 72 69  Resp: 16 18 17 18   Temp: 98.5 F (36.9 C) 98.3 F (36.8 C) 98.1 F (36.7 C) 97.9 F (36.6 C)  TempSrc: Oral Oral Oral Oral  SpO2: 95% 100% 100% 97%  Weight:      Height:       Weight change: -0.5 kg  Intake/Output Summary (Last 24 hours) at 11/01/2020 1103 Last data filed at 11/01/2020 0949 Gross per 24 hour  Intake 640 ml  Output 1950 ml  Net -1310 ml    Dialysis Orders:  MWF-[facility] in [place]  4hrs, BFR 400, DFR 500,  EDW 81kg, 2K/ 2.5Ca  Access: RUE AVF Heparin: none Epogen 2000 units IV push 3x qw Hectorol 26mcg IV 3x qw Home meds: Renvela 800mg  3 tabs po tid with meals and 2 with snacks  Last Labs: Hgb 10.9, K 5.6, Ca 7.7, Alb 3.5  Assessment/Plan: 1. Possible HCAP: WBC elevated on admission. BC cultures negative. COVID and Flu (-). Noted patient with expiratory wheezing. CXR showed developing opacities.  Overall improving.  Completed cefepime 2.  ESRD -  On HD MWF-has received extra treatments for volume removal.  We will do extra treatment today for confusion as well as volume management.  Plan for dialysis again tomorrow to maintain schedule 3.  Hypertension/volume  -aggressive ultrafiltration with dialysis has improved blood pressure.  Has had significant improvement in weight but remains volume overloaded.  Extra session of dialysis today 4.  Anemia of CKD -hemoglobin acceptable at this time 5.  Secondary Hyperparathyroidism -  CorrCa 8.1- cont Hectorol.  PO4 OK on low dose renvela.- no change 6.  Nutrition - Continue renal diet with fluid restriction 7. Hyperkalemia-  resolved with dialysis 8. Delirium: Unclear  cause of delirium that started yesterday.  Not on steroids.  Was receiving cefepime which can sometimes cause neurotoxicity particularly in dialysis patients.  This is partially cleared by dialysis so we will perform dialysis today and again tomorrow.  If her mental status does not improve with dialysis and stopping cefepime this may just be hospital-acquired delirium associated with her current illness     [name]    Labs: Basic Metabolic Panel: Recent Labs  Lab 10/30/20 0438 10/31/20 0408 11/01/20 0256  NA 133* 131* 133*  K 3.9 3.9 3.8  CL 94* 93* 94*  CO2 30 27 29   GLUCOSE 178* 336* 224*  BUN 24* 37* 20  CREATININE 4.27* 6.93* 4.78*  CALCIUM 8.3* 8.1* 8.6*  PHOS 2.8 3.0 2.6   Liver Function Tests: Recent Labs  Lab 10/26/20 0226 10/27/20 0334 10/30/20 0438 10/31/20 0408 11/01/20 0256  AST 42*  --   --   --   --   ALT 26  --   --   --   --   ALKPHOS 110  --   --   --   --   BILITOT 0.8  --   --   --   --   PROT 7.7  --   --   --   --   ALBUMIN 3.5   < > 2.7* 2.6* 2.7*   < > = values in this interval not displayed.  No results for input(s): LIPASE, AMYLASE in the last 168 hours. No results for input(s): AMMONIA in the last 168 hours. CBC: Recent Labs  Lab 10/28/20 0436 10/29/20 0342 10/30/20 0438 10/31/20 0408 11/01/20 0256  WBC 20.7* 14.3* 11.1* 8.5 10.4  NEUTROABS 17.4* 11.3* 7.9* 5.2 7.7  HGB 11.2* 10.4* 11.0* 10.6* 11.2*  HCT 31.5* 30.3* 32.4* 31.4* 33.2*  MCV 89.0 88.9 90.8 91.3 91.5  PLT 124* 137* 154 149* 160   Cardiac Enzymes: No results for input(s): CKTOTAL, CKMB, CKMBINDEX, TROPONINI in the last 168 hours. CBG: Recent Labs  Lab 10/31/20 1208 10/31/20 1306 10/31/20 1626 10/31/20 2017 11/01/20 0634  GLUCAP 124* 114* 284* 170* 223*    Iron Studies: No results for input(s): IRON, TIBC, TRANSFERRIN, FERRITIN in the last 72 hours. Studies/Results: No results found. Medications: Infusions:   Scheduled Medications: . aspirin   162 mg Oral Daily  . dextromethorphan-guaiFENesin  1 tablet Oral BID  . doxercalciferol  2 mcg Intravenous Q M,W,F-HD  . heparin injection (subcutaneous)  5,000 Units Subcutaneous Q8H  . insulin aspart  0-5 Units Subcutaneous QHS  . insulin aspart  0-6 Units Subcutaneous TID WC  . insulin aspart  2 Units Subcutaneous TID WC  . insulin glargine  3 Units Subcutaneous BID  . labetalol  100 mg Oral BID  . multivitamin  1 tablet Oral QHS  . pentoxifylline  400 mg Oral Q breakfast  . sevelamer carbonate  800 mg Oral TID WC    have reviewed scheduled and prn medications.  Physical Exam: General: Lying in bed with no distress but confused and aggitated Heart: RRR Lungs: Expiratory wheezing present, no increased work of breathing Abdomen: soft, non tender Extremities: Warm and well perfused Dialysis Access: right AVF- patent    11/01/2020,11:03 AM  LOS: 6 days

## 2020-11-01 NOTE — TOC Initial Note (Signed)
Transition of Care [facility]) - Initial/Assessment Note    Patient Details  Name: [name] MRN: [id number] Date of Birth: 07/10/1940  Transition of Care [facility]) CM/SW Contact:    Sable Feil, LCSW Phone Number: 11/01/2020, 11:51 AM  Clinical Narrative: CSW visited room and greeted patient who was lying in bed, alert and pleasant. Talked with daughter Romie Minus at bedside and stated purpose of visit - SNF placement for ST rehab. Romie Minus indicated that [place] would need to talk with her sister Shannell Mikkelsen - [phone], as she handles everything for their mom. Romie Minus confirmed that patient lives with her 2 brothers and that [place] stays with her mom at night.   [name] contacted and SNF placement for ST rehab explained. ST rehab and the SNF search process explained. Daughter asked to talk time to talk with her family and get back with CSW.  10:46 am: Received call back from [place] and family in agreement with ST rehab and was agreeable to a search in [facility] as her mom receives dialysis in [place]. CSW was informed that patient has been vaccinated Levan Hurst) and the dates are: 08/31/19, 09/28/19 and 06/20/20                 Expected Discharge Plan: [place] Barriers to Discharge: Insurance Authorization,SNF Pending bed offer   Patient Goals and CMS Choice Patient states their goals for this hospitalization and ongoing recovery are:: Family in agreement with ST rehab before patient returns home CMS Medicare.gov Compare Post Acute Care list provided to:: Patient Represenative (must comment) (Daughter provided with SNF list) Choice offered to / list presented to : Adult Children  Expected Discharge Plan and Services Expected Discharge Plan: [place] In-house Referral: Clinical Social Work     Living arrangements for the past 2 months: [place]                                      Prior Living  Arrangements/Services Living arrangements for the past 2 months: Single Family Home Lives with:: Adult Children (Patient lives with her 2 sons) Patient language and need for interpreter reviewed:: No Do you feel safe going back to the place where you live?: No   Family agreeable to [place] rehab  Need for Family Participation in Patient Care: Yes (Comment) Care giver support system in place?: Yes (comment)   Criminal Activity/Legal Involvement Pertinent to Current Situation/Hospitalization: No - Comment as needed  Activities of Daily Living      Permission Sought/Granted Permission sought to share information with : Family Supports (Patient was pleasantly confused during CSW's visit) Permission granted to share information with :  (Patient allowed CSW to talk with daughter at bedside. 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 Acute respiratory failure with hypoxia ([place]) 10/26/2020  . Elevated troponin 10/26/2020  .  Volume overload 10/26/2020  . Hyperglycemia 10/26/2020  . Hypertensive urgency 10/26/2020  . Gangrene of toe of right foot ([place])   . Cellulitis of right foot 01/05/2020  . History of CVA (cerebrovascular accident) 01/05/2020  . Thrombocytopenia ([place]) 01/05/2020  . Diabetic ulcer of right foot  associated with diabetes mellitus due to underlying condition ([place]) 01/05/2020  . History of colonic polyps 04/01/2018  . Acute bronchitis 10/15/2017  . DM type 2, goal A1c below 7   . HCAP (healthcare-associated pneumonia) 05/29/2015  . Hyperkalemia 05/29/2015  . Diabetes mellitus with complication ([place])   . ESRD (end stage renal disease) on dialysis ([place])   . Encounter for adequacy testing for hemodialysis ([place]) 01/17/2014  . Mechanical complication of other vascular device, implant, and graft 01/17/2014  . Disturbances of vision, late effect of stroke 11/03/2013  . Right hemiparesis ([place]) 08/19/2013  . CVA (cerebral infarction) 07/20/2013  . Gastroparesis due to DM ([place]) 07/01/2013  . End stage renal disease ([place]) 06/25/2013  . Anemia 01/26/2013  . DM W/NEURO MNFST, TYPE II, UNCONTROLLED 11/19/2006  . Leukocytosis 11/19/2006  . DEPENDENT EDEMA, LEGS, BILATERAL 11/19/2006  . SINUS TACHYCARDIA 10/16/2006  . Diabetes mellitus, type 2 ([place]) 05/20/2006  . HLD (hyperlipidemia) 05/20/2006  . SYNDROME, RESTLESS LEGS 05/20/2006  . Hereditary and idiopathic peripheral neuropathy 05/20/2006  . Hypertension associated with diabetes ([place]) 05/20/2006   PCP:  [name], MD Pharmacy:   [id number] [place], [address] [place] [address] Phone: [phone] Fax: [place], [place] - [address] [place] [address] Phone: [phone] Fax: [phone]  [facility] [address] [place] [address] Phone: [phone] Fax: [place], [address] [place] [address] Phone: [phone] Fax: [phone]  [place], [place] [address] Phone: [phone] Fax:  [phone]    Social Determinants of Health (SDOH) Interventions  None requested or needed at this time  Readmission Risk Interventions Readmission Risk Prevention Plan 01/17/2020  Transportation Screening Complete  PCP or Specialist Appt within 5-7 Days Not Complete  Not Complete comments pending disposition  Home Care Screening Complete  Medication Review (RN CM) Referral to Pharmacy  Some recent data might be hidden

## 2020-11-01 NOTE — NC FL2 (Addendum)
[place] LEVEL OF CARE SCREENING TOOL     IDENTIFICATION  Patient Name: [name] Birthdate: [birth year] Sex: female Admission Date (Current Location): 10/25/2020  [facility] and [place] Number:  [employer] and Address:  [facility] [place] [place] [address], [place], [place] [address]      Provider Number: [id number]  Attending Physician Name and Address:  Charlynne Cousins, MD  Relative Name and Phone Number:  Breya Cass - daughter - [phone]    Current Level of Care: Hospital Recommended Level of Care: [place] Prior Approval Number:    Date Approved/Denied:   PASRR Number: [id number] (Eff. 11/01/20)  Discharge Plan: SNF    Current Diagnoses: Patient Active Problem List   Diagnosis Date Noted  . Acute respiratory failure with hypoxia ([place]) 10/26/2020  . Elevated troponin 10/26/2020  . Volume overload 10/26/2020  . Hyperglycemia 10/26/2020  . Hypertensive urgency 10/26/2020  . Gangrene of toe of right foot ([place])   . Cellulitis of right foot 01/05/2020  . History of CVA (cerebrovascular accident) 01/05/2020  . Thrombocytopenia ([place]) 01/05/2020  . Diabetic ulcer of right foot associated with diabetes mellitus due to underlying condition ([place]) 01/05/2020  . History of colonic polyps 04/01/2018  . Acute bronchitis 10/15/2017  . DM type 2, goal A1c below 7   . HCAP (healthcare-associated pneumonia) 05/29/2015  . Hyperkalemia 05/29/2015  . Diabetes mellitus with complication ([place])   . ESRD (end stage renal disease) on dialysis ([place])   . Encounter for adequacy testing for hemodialysis ([place]) 01/17/2014  . Mechanical complication of other vascular device, implant, and graft 01/17/2014  . Disturbances of vision, late effect of stroke 11/03/2013  . Right hemiparesis ([place]) 08/19/2013  . CVA (cerebral infarction) 07/20/2013  . Gastroparesis due to DM ([place]) 07/01/2013  . End stage renal disease  ([place]) 06/25/2013  . Anemia 01/26/2013  . DM W/NEURO MNFST, TYPE II, UNCONTROLLED 11/19/2006  . Leukocytosis 11/19/2006  . DEPENDENT EDEMA, LEGS, BILATERAL 11/19/2006  . SINUS TACHYCARDIA 10/16/2006  . Diabetes mellitus, type 2 ([place]) 05/20/2006  . HLD (hyperlipidemia) 05/20/2006  . SYNDROME, RESTLESS LEGS 05/20/2006  . Hereditary and idiopathic peripheral neuropathy 05/20/2006  . Hypertension associated with diabetes ([place]) 05/20/2006    Orientation RESPIRATION BLADDER Height & Weight     Self,Situation,Place  O2 (1 lter oxygen) Incontinent Weight: 161 lb 13.1 oz (73.4 kg) Height:  5\' 7"  (170.2 cm)  BEHAVIORAL SYMPTOMS/MOOD NEUROLOGICAL BOWEL NUTRITION STATUS      Incontinent Diet (renal diet with 1200 mL fluid restriction)  AMBULATORY STATUS COMMUNICATION OF NEEDS Skin   Total Care ([place] was unable to ambulate with PT during eval on 5/16) Verbally Normal                       Personal Care Assistance Level of Assistance  Bathing,Feeding,Dressing Bathing Assistance: Maximum assistance (Upper body min guard, lower body max assist) Feeding assistance: Limited assistance (Assistance with set-up) Dressing Assistance: Maximum assistance (Upper body min guard, Lower body max assist)     Functional Limitations Info  Sight,Hearing,Speech Sight Info: Impaired Hearing Info: Adequate Speech Info: Adequate    SPECIAL CARE FACTORS FREQUENCY  PT (By licensed PT),OT (By licensed OT)     PT Frequency: Evaluated 5/16. PT at SNF eval and treat, a minimum of 5 days per week OT Frequency: Evaluated 5/16, OT at SNF eval and treat, a minimum of 5 days per week  Contractures Contractures Info: Not present    Additional Factors Info  Code Status,Allergies,Insulin Sliding Scale Code Status Info: Full Allergies Info: Ambien, Reglan   Insulin Sliding Scale Info: 0-6 Units 3 times per day with measl; 0-5 Units daily at bedtime       Current Medications (11/01/2020):  This  is the current hospital active medication list Current Facility-Administered Medications  Medication Dose Route Frequency Provider Last Rate Last Admin  . acetaminophen (TYLENOL) tablet 650 mg  650 mg Oral Q6H PRN Nicoletta Dress, Na, MD       Or  . acetaminophen (TYLENOL) suppository 650 mg  650 mg Rectal Q6H PRN Nicoletta Dress, Na, MD      . albuterol (VENTOLIN HFA) 108 (90 Base) MCG/ACT inhaler 2 puff  2 puff Inhalation Q6H PRN Nicoletta Dress, Na, MD      . aspirin chewable tablet 162 mg  162 mg Oral Daily Li, Na, MD   162 mg at 11/01/20 0852  . benzonatate (TESSALON) capsule 100 mg  100 mg Oral TID PRN Nicoletta Dress, Na, MD   100 mg at 10/31/20 1353  . dextromethorphan-guaiFENesin (MUCINEX DM) 30-600 MG per 12 hr tablet 1 tablet  1 tablet Oral BID Alma Friendly, MD   1 tablet at 11/01/20 [phone]  . doxercalciferol (HECTOROL) injection 2 mcg  2 mcg Intravenous Q M,W,F-HD Corliss Parish, MD   2 mcg at 10/31/20 0949  . heparin injection 5,000 Units  5,000 Units Subcutaneous Q8H Alma Friendly, MD   5,000 Units at 11/01/20 [phone]  . HYDROcodone-acetaminophen (NORCO/VICODIN) 5-325 MG per tablet 0.5 tablet  0.5 tablet Oral BID PRN Nicoletta Dress, Na, MD   0.5 tablet at 10/31/20 2014  . insulin aspart (novoLOG) injection 0-5 Units  0-5 Units Subcutaneous QHS Charlynne Cousins, MD      . insulin aspart (novoLOG) injection 0-6 Units  0-6 Units Subcutaneous TID WC Charlynne Cousins, MD   2 Units at 11/01/20 0757  . insulin aspart (novoLOG) injection 2 Units  2 Units Subcutaneous TID WC Charlynne Cousins, MD   2 Units at 11/01/20 [phone]  . insulin glargine (LANTUS) injection 3 Units  3 Units Subcutaneous BID Charlynne Cousins, MD   3 Units at 11/01/20 [phone]  . ipratropium-albuterol (DUONEB) 0.5-2.5 (3) MG/3ML nebulizer solution 3 mL  3 mL Nebulization Q3H PRN Corliss Parish, MD   3 mL at 10/29/20 1141  . labetalol (NORMODYNE) injection 10 mg  10 mg Intravenous Q4H PRN Nicoletta Dress, Na, MD   10 mg at 10/26/20 1221  . labetalol (NORMODYNE) tablet  100 mg  100 mg Oral BID Alma Friendly, MD   100 mg at 11/01/20 0852  . multivitamin (RENA-VIT) tablet 1 tablet  1 tablet Oral QHS Charlann Lange, MD   1 tablet at 10/31/20 2108  . ondansetron (ZOFRAN) tablet 4 mg  4 mg Oral Q6H PRN Nicoletta Dress, Na, MD       Or  . ondansetron (ZOFRAN) injection 4 mg  4 mg Intravenous Q6H PRN Nicoletta Dress, Na, MD   4 mg at 10/31/20 0948  . pentoxifylline (TRENTAL) CR tablet 400 mg  400 mg Oral Q breakfast Nicoletta Dress, Na, MD   400 mg at 11/01/20 0758  . sevelamer carbonate (RENVELA) tablet 800 mg  800 mg Oral TID WC Nicoletta Dress, Na, MD   800 mg at 11/01/20 0350     Discharge Medications: Please see discharge summary for a list of discharge medications.  Relevant Imaging Results:  Relevant Lab Results:   Additional  Information [id number]. Patient is vaccinated - Moderna: 08/31/19, 09/28/19 and 06/20/20  Patient on dialysis: MWF at [facility], Mila Homer, LCSW

## 2020-11-01 NOTE — Progress Notes (Signed)
PT Cancellation Note  Patient Details Name: [name] MRN: [id number] DOB: 05/19/1941   Cancelled Treatment:    Reason Eval/Treat Not Completed: Patient at procedure or test/unavailable. Pt in dialysis  Lyanne Co, DPT Acute Rehabilitation Services [phone]   [place] 11/01/2020, 1:50 PM

## 2020-11-02 ENCOUNTER — Inpatient Hospital Stay (HOSPITAL_COMMUNITY): Payer: Medicare Other

## 2020-11-02 DIAGNOSIS — J9601 Acute respiratory failure with hypoxia: Secondary | ICD-10-CM | POA: Diagnosis not present

## 2020-11-02 DIAGNOSIS — R778 Other specified abnormalities of plasma proteins: Secondary | ICD-10-CM | POA: Diagnosis not present

## 2020-11-02 DIAGNOSIS — E118 Type 2 diabetes mellitus with unspecified complications: Secondary | ICD-10-CM | POA: Diagnosis not present

## 2020-11-02 DIAGNOSIS — J189 Pneumonia, unspecified organism: Secondary | ICD-10-CM | POA: Diagnosis not present

## 2020-11-02 LAB — GLUCOSE, CAPILLARY
Glucose-Capillary: 213 mg/dL — ABNORMAL HIGH (ref 70–99)
Glucose-Capillary: 181 mg/dL — ABNORMAL HIGH (ref 70–99)
Glucose-Capillary: 225 mg/dL — ABNORMAL HIGH (ref 70–99)
Glucose-Capillary: 193 mg/dL — ABNORMAL HIGH (ref 70–99)

## 2020-11-02 LAB — RENAL FUNCTION PANEL
Albumin: 2.8 g/dL — ABNORMAL LOW (ref 3.5–5.0)
Anion gap: 10 (ref 5–15)
BUN: 16 mg/dL (ref 8–23)
CO2: 29 mmol/L (ref 22–32)
Calcium: 8.9 mg/dL (ref 8.9–10.3)
Chloride: 93 mmol/L — ABNORMAL LOW (ref 98–111)
Creatinine, Ser: 4.05 mg/dL — ABNORMAL HIGH (ref 0.44–1.00)
GFR, Estimated: 11 mL/min — ABNORMAL LOW (ref >60)
Glucose, Bld: 198 mg/dL — ABNORMAL HIGH (ref 70–99)
Phosphorus: 2.7 mg/dL (ref 2.5–4.6)
Potassium: 4.1 mmol/L (ref 3.5–5.1)
Sodium: 132 mmol/L — ABNORMAL LOW (ref 135–145)

## 2020-11-02 LAB — CBC WITH DIFFERENTIAL/PLATELET
Abs Immature Granulocytes: 0.8 10*3/uL — ABNORMAL HIGH (ref 0.00–0.07)
Basophils Absolute: 0.1 10*3/uL (ref 0.0–0.1)
Basophils Relative: 1 %
Eosinophils Absolute: 0.2 10*3/uL (ref 0.0–0.5)
Eosinophils Relative: 2 %
HCT: 35 % — ABNORMAL LOW (ref 36.0–46.0)
Hemoglobin: 11.5 g/dL — ABNORMAL LOW (ref 12.0–15.0)
Immature Granulocytes: 6 %
Lymphocytes Relative: 15 %
Lymphs Abs: 1.9 10*3/uL (ref 0.7–4.0)
MCH: 30.7 pg (ref 26.0–34.0)
MCHC: 32.9 g/dL (ref 30.0–36.0)
MCV: 93.3 fL (ref 80.0–100.0)
Monocytes Absolute: 1.9 10*3/uL — ABNORMAL HIGH (ref 0.1–1.0)
Monocytes Relative: 15 %
Neutro Abs: 7.8 10*3/uL — ABNORMAL HIGH (ref 1.7–7.7)
Neutrophils Relative %: 61 %
Platelets: 165 10*3/uL (ref 150–400)
RBC: 3.75 MIL/uL — ABNORMAL LOW (ref 3.87–5.11)
RDW: 14.7 % (ref 11.5–15.5)
WBC: 12.7 10*3/uL — ABNORMAL HIGH (ref 4.0–10.5)
nRBC: 0 % (ref 0.0–0.2)

## 2020-11-02 LAB — AMMONIA: Ammonia: 11 umol/L (ref 9–35)

## 2020-11-02 LAB — SARS CORONAVIRUS 2 (TAT 6-24 HRS): SARS Coronavirus 2: NEGATIVE

## 2020-11-02 MED ORDER — VANCOMYCIN HCL 750 MG/150ML IV SOLN
750.0000 mg | INTRAVENOUS | Status: AC
  Administered 2020-11-05: 750 mg via INTRAVENOUS
  Filled 2020-11-02 (×2): qty 150

## 2020-11-02 MED ORDER — VANCOMYCIN HCL 1750 MG/350ML IV SOLN
1750.0000 mg | Freq: Once | INTRAVENOUS | Status: AC
  Administered 2020-11-02: 1750 mg via INTRAVENOUS
  Filled 2020-11-02: qty 350

## 2020-11-02 MED ORDER — SODIUM CHLORIDE 0.9 % IV SOLN
1.0000 g | INTRAVENOUS | Status: DC
  Administered 2020-11-02 – 2020-11-04 (×3): 1 g via INTRAVENOUS
  Filled 2020-11-02 (×4): qty 1

## 2020-11-02 NOTE — Care Management Important Message (Signed)
Important Message  Patient Details  Name: [name] MRN: [id number] Date of Birth: 07/24/1940   Medicare Important Message Given:  Yes - Important Message mailed due to current National Emergency  Verbal consent obtained due to current National Emergency  Relationship to patient: Self Contact Name: Artavia Call Date: 11/02/20  Time: 1037 Phone: [phone] Outcome: Spoke with contact Important Message mailed to: Patient address on file    Delorse Lek 11/02/2020, 10:37 AM

## 2020-11-02 NOTE — Discharge Summary (Signed)
Physician Discharge Summary  [name] QIW:[id number] DOB: 06/11/1941 DOA: 10/25/2020  PCP: [name], MD  Admit date: 10/25/2020 Discharge date: 11/02/2020  Admitted From: Home (Home, ALF, ILF, SNF) isposition:  SNF  Recommendations for Outpatient Follow-up:  1. Follow up with PCP in 1-2 weeks 2. Please obtain BMP/CBC in one week   Home Health:No Equipment/Devices:None  Discharge Condition:Stable CODE STATUS:Full Diet recommendation: Heart Healthy  Brief/Interim Summary: 80 y.o. female past medical history significant for stage renal disease on hemodialysis, essential hypertension stroke comes in complaining of chest pain and shortness of breath, she apparently missed her last dialysis treatment in the ED was found to be tachycardic with a respiratory rate of 22 satting 90% on 2 L with a blood pressure of 243/104 chest x-ray shows no acute findings.  Discharge Diagnoses:  Principal Problem:   Acute respiratory failure with hypoxia ([place]) Active Problems:   HLD (hyperlipidemia)   Hypertension associated with diabetes ([place])   HCAP (healthcare-associated pneumonia)   Hyperkalemia   Diabetes mellitus with complication (HCC)   ESRD (end stage renal disease) on dialysis ([place])   History of CVA (cerebrovascular accident)   Elevated troponin   Volume overload   Hyperglycemia   Hypertensive urgency  Acute respiratory failure with hypoxia due to volume overload in the setting of noncompliance with her dialysis/hyperkalemia/hyponatremia: Nephrology was consulted and started her on dialysis and her respiration improved she was weaned to her home base of 3 L of oxygen. Her volume status improved on physical exam.  HCAP she reported productive cough on admission with a white count of 18 blood cultures were sent which remained stable she completed course of antibiotics in house. SARS-CoV-2 PCR and flu PCR remain negative.  Chest pain atypical: Due to volume overload versus  pneumonia chest pain-free cardiac biomarkers flat.  Hypertensive urgency: Improved with hemodialysis no changes made to her home regimen.  Uncontrolled diabetes mellitus type 2 with hyperglycemia: Continue long-acting insulin plus sliding scale.  Anemia of renal disease: Further management per renal.  Chronic thrombocytopenia: Diabetes stable follow-up with PCP as an outpatient.  Discharge Instructions  Discharge Instructions    Diet - low sodium heart healthy   Complete by: As directed    Increase activity slowly   Complete by: As directed      Allergies as of 11/02/2020      Reactions   Ambien [zolpidem] Other (See Comments)   Hallucinations    Reglan [metoclopramide] Other (See Comments)   "makes me crazy"      Medication List    TAKE these medications   albuterol 108 (90 Base) MCG/ACT inhaler Commonly known as: VENTOLIN HFA Inhale 2 puffs into the lungs every 6 (six) hours as needed for wheezing or shortness of breath.   aspirin 81 MG chewable tablet Chew 2 tablets (162 mg total) by mouth daily.   BD Insulin Syringe U/F 31G X 5/16" 0.3 ML Misc Generic drug: Insulin Syringe-Needle U-100 2 (two) times daily. as directed   benzonatate 100 MG capsule Commonly known as: TESSALON Take 1 capsule (100 mg total) by mouth every 8 (eight) hours. What changed:   when to take this  reasons to take this   cyanocobalamin 1000 MCG/ML injection Commonly known as: (VITAMIN B-12) Inject 1 mL into the muscle every 30 (thirty) days.   cyclobenzaprine 10 MG tablet Commonly known as: FLEXERIL Take 0.5 tablets (5 mg total) by mouth 3 (three) times daily as needed for muscle spasms.   HYDROcodone-acetaminophen 5-325 MG  tablet Commonly known as: NORCO/VICODIN Take 0.5 tablets by mouth 2 (two) times daily as needed for moderate pain.   insulin NPH Human 100 UNIT/ML injection Commonly known as: NOVOLIN N Inject 0.07 mLs (7 Units total) into the skin 2 (two) times daily  before a meal.   labetalol 100 MG tablet Commonly known as: NORMODYNE Take 1 tablet (100 mg total) by mouth 2 (two) times daily.   lidocaine-prilocaine cream Commonly known as: EMLA Apply 1 application topically every Monday, Wednesday, and Friday.   multivitamin Tabs tablet Take 1 tablet by mouth at bedtime.   omeprazole 20 MG capsule Commonly known as: PRILOSEC Take 1 capsule (20 mg total) by mouth daily.   pentoxifylline 400 MG CR tablet Commonly known as: TRENTAL Take 1 tablet (400 mg total) by mouth daily with breakfast.   predniSONE 5 MG (21) Tbpk tablet Commonly known as: STERAPRED UNI-PAK 21 TAB Take 5 mg by mouth See admin instructions. 6,5,4,3,2,1   sevelamer carbonate 800 MG tablet Commonly known as: RENVELA Take 800 mg by mouth 3 (three) times daily with meals.   sucralfate 1 g tablet Commonly known as: Carafate Take 1 tablet (1 g total) by mouth 4 (four) times daily as needed.       Allergies  Allergen Reactions  . Ambien [Zolpidem] Other (See Comments)    Hallucinations   . Reglan [Metoclopramide] Other (See Comments)    "makes me crazy"    Consultations:  Nephrology  Pulmonary and critical care   Procedures/Studies: DG Chest Port 1 View  Result Date: 10/27/2020 CLINICAL DATA:  Dyspnea EXAM: PORTABLE CHEST - 1 VIEW COMPARISON:  10/25/2020 FINDINGS: Increasing coarse airspace opacities in the left mid and lower lung. There are some very mild patchy opacities in the right lower lung. Stable cardiomegaly. No effusion.  No pneumothorax. Visualized bones unremarkable. IMPRESSION: 1. Developing asymmetric scattered airspace opacities, left greater than right. Electronically Signed   By: Lucrezia Europe M.D.   On: 10/27/2020 10:39   DG Chest Portable 1 View  Result Date: 10/25/2020 CLINICAL DATA:  Shortness of breath and increasing right-sided chest pain EXAM: PORTABLE CHEST 1 VIEW COMPARISON:  09/20/2020 FINDINGS: Cardiac shadow is enlarged. Loop recorder  is again seen. Lungs are well aerated without focal infiltrate or sizable effusion. No bony abnormality is seen. Stenting is noted in the right arm. IMPRESSION: No acute abnormality noted. Electronically Signed   By: Inez Catalina M.D.   On: 10/25/2020 19:48    (Echo, Carotid, EGD, Colonoscopy, ERCP)    Subjective: No complaints  Discharge Exam: Vitals:   11/01/20 2104 11/02/20 0508  BP: (!) 111/50 (!) 148/56  Pulse: 83 79  Resp: 16 16  Temp: 98.4 F (36.9 C) 97.8 F (36.6 C)  SpO2: 94% 100%   Vitals:   11/01/20 1624 11/01/20 1703 11/01/20 2104 11/02/20 0508  BP: (!) 118/49 (!) 113/44 (!) 111/50 (!) 148/56  Pulse: 72 77 83 79  Resp: 18 19 16 16   Temp: 97.9 F (36.6 C) 97.8 F (36.6 C) 98.4 F (36.9 C) 97.8 F (36.6 C)  TempSrc: Oral Oral  Oral  SpO2: 99% 100% 94% 100%  Weight: 77.8 kg  77.8 kg   Height:        General: Pt is alert, awake, not in acute distress Cardiovascular: RRR, S1/S2 +, no rubs, no gallops Respiratory: CTA bilaterally, no wheezing, no rhonchi Abdominal: Soft, NT, ND, bowel sounds + Extremities: no edema, no cyanosis    The results of significant  diagnostics from this hospitalization (including imaging, microbiology, ancillary and laboratory) are listed below for reference.     Microbiology: Recent Results (from the past 240 hour(s))  Resp Panel by RT-PCR (Flu A&B, Covid) Nasopharyngeal Swab     Status: None   Collection Time: 10/25/20  7:26 PM   Specimen: Nasopharyngeal Swab; Nasopharyngeal(NP) swabs in vial transport medium  Result Value Ref Range Status   SARS Coronavirus 2 by RT PCR NEGATIVE NEGATIVE Final    Comment: (NOTE) SARS-CoV-2 target nucleic acids are NOT DETECTED.  The SARS-CoV-2 RNA is generally detectable in upper respiratory specimens during the acute phase of infection. The lowest concentration of SARS-CoV-2 viral copies this assay can detect is 138 copies/mL. A negative result does not preclude SARS-Cov-2 infection and  should not be used as the sole basis for treatment or other patient management decisions. A negative result may occur with  improper specimen collection/handling, submission of specimen other than nasopharyngeal swab, presence of viral mutation(s) within the areas targeted by this assay, and inadequate number of viral copies(<138 copies/mL). A negative result must be combined with clinical observations, patient history, and epidemiological information. The expected result is Negative.  Fact Sheet for Patients:  EntrepreneurPulse.com.au  Fact Sheet for Healthcare Providers:  IncredibleEmployment.be  This test is no t yet approved or cleared by the [place] FDA and  has been authorized for detection and/or diagnosis of SARS-CoV-2 by FDA under an Emergency Use Authorization (EUA). This EUA will remain  in effect (meaning this test can be used) for the duration of the COVID-19 declaration under Section 564(b)(1) of the Act, 21 U.S.C.section 360bbb-3(b)(1), unless the authorization is terminated  or revoked sooner.       Influenza A by PCR NEGATIVE NEGATIVE Final   Influenza B by PCR NEGATIVE NEGATIVE Final    Comment: (NOTE) The Xpert Xpress SARS-CoV-2/FLU/RSV plus assay is intended as an aid in the diagnosis of influenza from Nasopharyngeal swab specimens and should not be used as a sole basis for treatment. Nasal washings and aspirates are unacceptable for Xpert Xpress SARS-CoV-2/FLU/RSV testing.  Fact Sheet for Patients: EntrepreneurPulse.com.au  Fact Sheet for Healthcare Providers: IncredibleEmployment.be  This test is not yet approved or cleared by the [place] FDA and has been authorized for detection and/or diagnosis of SARS-CoV-2 by FDA under an Emergency Use Authorization (EUA). This EUA will remain in effect (meaning this test can be used) for the duration of the COVID-19 declaration  under Section 564(b)(1) of the Act, 21 U.S.C. section 360bbb-3(b)(1), unless the authorization is terminated or revoked.  Performed at [facility], [place] [address] [place], [place] [address]   Culture, blood (Routine X 2) w Reflex to ID Panel     Status: None   Collection Time: 10/26/20  4:49 AM   Specimen: BLOOD RIGHT FOREARM  Result Value Ref Range Status   Specimen Description BLOOD RIGHT FOREARM  Final   Special Requests   Final    BOTTLES DRAWN AEROBIC AND ANAEROBIC Blood Culture adequate volume   Culture   Final    NO GROWTH 5 DAYS Performed at [facility], [place] [address], [place], [place] [address]    Report Status 10/31/2020 FINAL  Final  Culture, blood (Routine X 2) w Reflex to ID Panel     Status: None   Collection Time: 10/26/20  4:54 AM   Specimen: BLOOD RIGHT HAND  Result Value Ref Range Status   Specimen Description BLOOD RIGHT HAND  Final  Special Requests   Final    BOTTLES DRAWN AEROBIC AND ANAEROBIC Blood Culture adequate volume   Culture   Final    NO GROWTH 5 DAYS Performed at [facility], [place] [address], [place], [place] [address]    Report Status 10/31/2020 FINAL  Final  MRSA PCR Screening     Status: None   Collection Time: 10/27/20  1:56 PM   Specimen: Nasopharyngeal  Result Value Ref Range Status   MRSA by PCR NEGATIVE NEGATIVE Final    Comment:        The GeneXpert MRSA Assay (FDA approved for NASAL specimens only), is one component of a comprehensive MRSA colonization surveillance program. It is not intended to diagnose MRSA infection nor to guide or monitor treatment for MRSA infections. Performed at [facility], [place] [address], [place], [place] [address]      Labs: BNP (last 3 results) No results for input(s): BNP in the last 8760 hours. Basic Metabolic Panel: Recent Labs  Lab 10/29/20 0342 10/30/20 0438 10/31/20 0408 11/01/20 0256 11/02/20 0541  NA 133* 133* 131* 133* 132*  K 4.3 3.9 3.9 3.8 4.1   CL 95* 94* 93* 94* 93*  CO2 25 30 27 29 29   GLUCOSE 190* 178* 336* 224* 198*  BUN 53* 24* 37* 20 16  CREATININE 8.25* 4.27* 6.93* 4.78* 4.05*  CALCIUM 8.0* 8.3* 8.1* 8.6* 8.9  PHOS 3.0 2.8 3.0 2.6 2.7   Liver Function Tests: Recent Labs  Lab 10/29/20 0342 10/30/20 0438 10/31/20 0408 11/01/20 0256 11/02/20 0541  ALBUMIN 2.5* 2.7* 2.6* 2.7* 2.8*   No results for input(s): LIPASE, AMYLASE in the last 168 hours. No results for input(s): AMMONIA in the last 168 hours. CBC: Recent Labs  Lab 10/29/20 0342 10/30/20 0438 10/31/20 0408 11/01/20 0256 11/02/20 0541  WBC 14.3* 11.1* 8.5 10.4 12.7*  NEUTROABS 11.3* 7.9* 5.2 7.7 7.8*  HGB 10.4* 11.0* 10.6* 11.2* 11.5*  HCT 30.3* 32.4* 31.4* 33.2* 35.0*  MCV 88.9 90.8 91.3 91.5 93.3  PLT 137* 154 149* 160 165   Cardiac Enzymes: No results for input(s): CKTOTAL, CKMB, CKMBINDEX, TROPONINI in the last 168 hours. BNP: Invalid input(s): POCBNP CBG: Recent Labs  Lab 11/01/20 0634 11/01/20 1158 11/01/20 1706 11/01/20 2103 11/02/20 0643  GLUCAP 223* 75 142* 242* 213*   D-Dimer No results for input(s): DDIMER in the last 72 hours. Hgb A1c Recent Labs    10/31/20 1330  HGBA1C 9.4*   Lipid Profile No results for input(s): CHOL, HDL, LDLCALC, TRIG, CHOLHDL, LDLDIRECT in the last 72 hours. Thyroid function studies No results for input(s): TSH, T4TOTAL, T3FREE, THYROIDAB in the last 72 hours.  Invalid input(s): FREET3 Anemia work up No results for input(s): VITAMINB12, FOLATE, FERRITIN, TIBC, IRON, RETICCTPCT in the last 72 hours. Urinalysis    Component Value Date/Time   COLORURINE YELLOW 10/15/2017 2244   APPEARANCEUR HAZY (A) 10/15/2017 2244   LABSPEC 1.015 10/15/2017 2244   PHURINE 7.0 10/15/2017 2244   GLUCOSEU >=500 (A) 10/15/2017 2244   HGBUR SMALL (A) 10/15/2017 2244   BILIRUBINUR NEGATIVE 10/15/2017 2244   KETONESUR NEGATIVE 10/15/2017 2244   PROTEINUR 100 (A) 10/15/2017 2244   UROBILINOGEN 0.2 11/19/2014  0909   NITRITE NEGATIVE 10/15/2017 2244   LEUKOCYTESUR MODERATE (A) 10/15/2017 2244   Sepsis Labs Invalid input(s): PROCALCITONIN,  WBC,  LACTICIDVEN Microbiology Recent Results (from the past 240 hour(s))  Resp Panel by RT-PCR (Flu A&B, Covid) Nasopharyngeal Swab     Status:  None   Collection Time: 10/25/20  7:26 PM   Specimen: Nasopharyngeal Swab; Nasopharyngeal(NP) swabs in vial transport medium  Result Value Ref Range Status   SARS Coronavirus 2 by RT PCR NEGATIVE NEGATIVE Final    Comment: (NOTE) SARS-CoV-2 target nucleic acids are NOT DETECTED.  The SARS-CoV-2 RNA is generally detectable in upper respiratory specimens during the acute phase of infection. The lowest concentration of SARS-CoV-2 viral copies this assay can detect is 138 copies/mL. A negative result does not preclude SARS-Cov-2 infection and should not be used as the sole basis for treatment or other patient management decisions. A negative result may occur with  improper specimen collection/handling, submission of specimen other than nasopharyngeal swab, presence of viral mutation(s) within the areas targeted by this assay, and inadequate number of viral copies(<138 copies/mL). A negative result must be combined with clinical observations, patient history, and epidemiological information. The expected result is Negative.  Fact Sheet for Patients:  EntrepreneurPulse.com.au  Fact Sheet for Healthcare Providers:  IncredibleEmployment.be  This test is no t yet approved or cleared by the [place] FDA and  has been authorized for detection and/or diagnosis of SARS-CoV-2 by FDA under an Emergency Use Authorization (EUA). This EUA will remain  in effect (meaning this test can be used) for the duration of the COVID-19 declaration under Section 564(b)(1) of the Act, 21 U.S.C.section 360bbb-3(b)(1), unless the authorization is terminated  or revoked sooner.       Influenza  A by PCR NEGATIVE NEGATIVE Final   Influenza B by PCR NEGATIVE NEGATIVE Final    Comment: (NOTE) The Xpert Xpress SARS-CoV-2/FLU/RSV plus assay is intended as an aid in the diagnosis of influenza from Nasopharyngeal swab specimens and should not be used as a sole basis for treatment. Nasal washings and aspirates are unacceptable for Xpert Xpress SARS-CoV-2/FLU/RSV testing.  Fact Sheet for Patients: EntrepreneurPulse.com.au  Fact Sheet for Healthcare Providers: IncredibleEmployment.be  This test is not yet approved or cleared by the [place] FDA and has been authorized for detection and/or diagnosis of SARS-CoV-2 by FDA under an Emergency Use Authorization (EUA). This EUA will remain in effect (meaning this test can be used) for the duration of the COVID-19 declaration under Section 564(b)(1) of the Act, 21 U.S.C. section 360bbb-3(b)(1), unless the authorization is terminated or revoked.  Performed at [facility], [place] [address] [place], [place] [address]   Culture, blood (Routine X 2) w Reflex to ID Panel     Status: None   Collection Time: 10/26/20  4:49 AM   Specimen: BLOOD RIGHT FOREARM  Result Value Ref Range Status   Specimen Description BLOOD RIGHT FOREARM  Final   Special Requests   Final    BOTTLES DRAWN AEROBIC AND ANAEROBIC Blood Culture adequate volume   Culture   Final    NO GROWTH 5 DAYS Performed at [facility], [place] [address], [place], [place] [address]    Report Status 10/31/2020 FINAL  Final  Culture, blood (Routine X 2) w Reflex to ID Panel     Status: None   Collection Time: 10/26/20  4:54 AM   Specimen: BLOOD RIGHT HAND  Result Value Ref Range Status   Specimen Description BLOOD RIGHT HAND  Final   Special Requests   Final    BOTTLES DRAWN AEROBIC AND ANAEROBIC Blood Culture adequate volume   Culture   Final    NO GROWTH 5 DAYS Performed at [facility], [place],  [place]  [address]    Report Status 10/31/2020 FINAL  Final  MRSA PCR Screening     Status: None   Collection Time: 10/27/20  1:56 PM   Specimen: Nasopharyngeal  Result Value Ref Range Status   MRSA by PCR NEGATIVE NEGATIVE Final    Comment:        The GeneXpert MRSA Assay (FDA approved for NASAL specimens only), is one component of a comprehensive MRSA colonization surveillance program. It is not intended to diagnose MRSA infection nor to guide or monitor treatment for MRSA infections. Performed at [facility], [place] [address] [place], [place] [address]      SIGNED:   Charlynne Cousins, MD  [facility] 11/02/2020, 8:54 AM Pager   If 7PM-7AM, please contact night-coverage [email or web address] Password [email or web address]

## 2020-11-02 NOTE — Progress Notes (Signed)
Subjective: Confused and agitated today.  Difficulty with dialysis yesterday with hypotension unable to achieve significant volume removal.  Objective Vital signs in last 24 hours: Vitals:   11/01/20 1703 11/01/20 2104 11/02/20 0508 11/02/20 0941  BP: (!) 113/44 (!) 111/50 (!) 148/56 (!) 143/57  Pulse: 77 83 79 75  Resp: 19 16 16 16   Temp: 97.8 F (36.6 C) 98.4 F (36.9 C) 97.8 F (36.6 C) 98.1 F (36.7 C)  TempSrc: Oral  Oral Oral  SpO2: 100% 94% 100% 100%  Weight:  77.8 kg    Height:       Weight change: -2.1 kg  Intake/Output Summary (Last 24 hours) at 11/02/2020 1237 Last data filed at 11/02/2020 0900 Gross per 24 hour  Intake 360 ml  Output 0 ml  Net 360 ml    Dialysis Orders:  MWF-[facility] in [place]  4hrs, BFR 400, DFR 500,  EDW 81kg, 2K/ 2.5Ca  Access: RUE AVF Heparin: none Epogen 2000 units IV push 3x qw Hectorol 60mcg IV 3x qw Home meds: Renvela 800mg  3 tabs po tid with meals and 2 with snacks  Last Labs: Hgb 10.9, K 5.6, Ca 7.7, Alb 3.5  Assessment/Plan: 1. HCAP: WBC elevated on admission. BC cultures negative. COVID and Flu (-). Noted patient with expiratory wheezing. CXR showed developing opacities.  Overall improving.  Completed cefepime 2.  ESRD -  On HD MWF-has received extra treatments for volume removal.  Plan for regular dialysis again today 3.  Hypertension/volume  -aggressive ultrafiltration with dialysis has improved blood pressure.  Continue with volume removal today as able 4.  Anemia of CKD -hemoglobin acceptable at this time 5.  Secondary Hyperparathyroidism -  CorrCa 8.1- cont Hectorol.  PO4 OK on low dose renvela.- no change 6.  Nutrition - Continue renal diet with fluid restriction 7. Hyperkalemia-  resolved with dialysis 8. Delirium: Unclear cause.  Defer management to primary team.  Tried extra dialysis due to possible neurotoxicity of cefepime but did not improve so this is unlikely the cause     Reesa Chew    Labs: Basic Metabolic Panel: Recent Labs  Lab 10/31/20 0408 11/01/20 0256 11/02/20 0541  NA 131* 133* 132*  K 3.9 3.8 4.1  CL 93* 94* 93*  CO2 27 29 29   GLUCOSE 336* 224* 198*  BUN 37* 20 16  CREATININE 6.93* 4.78* 4.05*  CALCIUM 8.1* 8.6* 8.9  PHOS 3.0 2.6 2.7   Liver Function Tests: Recent Labs  Lab 10/31/20 0408 11/01/20 0256 11/02/20 0541  ALBUMIN 2.6* 2.7* 2.8*   No results for input(s): LIPASE, AMYLASE in the last 168 hours. No results for input(s): AMMONIA in the last 168 hours. CBC: Recent Labs  Lab 10/29/20 0342 10/30/20 0438 10/31/20 0408 11/01/20 0256 11/02/20 0541  WBC 14.3* 11.1* 8.5 10.4 12.7*  NEUTROABS 11.3* 7.9* 5.2 7.7 7.8*  HGB 10.4* 11.0* 10.6* 11.2* 11.5*  HCT 30.3* 32.4* 31.4* 33.2* 35.0*  MCV 88.9 90.8 91.3 91.5 93.3  PLT 137* 154 149* 160 165   Cardiac Enzymes: No results for input(s): CKTOTAL, CKMB, CKMBINDEX, TROPONINI in the last 168 hours. CBG: Recent Labs  Lab 11/01/20 1158 11/01/20 1706 11/01/20 2103 11/02/20 0643 11/02/20 1147  GLUCAP 75 142* 242* 213* 181*    Iron Studies: No results for input(s): IRON, TIBC, TRANSFERRIN, FERRITIN in the last 72 hours. Studies/Results: No results found. Medications: Infusions:   Scheduled Medications: . aspirin  162 mg Oral Daily  . dextromethorphan-guaiFENesin  1 tablet Oral BID  .  doxercalciferol  2 mcg Intravenous Q M,W,F-HD  . heparin injection (subcutaneous)  5,000 Units Subcutaneous Q8H  . insulin aspart  0-5 Units Subcutaneous QHS  . insulin aspart  0-6 Units Subcutaneous TID WC  . insulin aspart  2 Units Subcutaneous TID WC  . insulin glargine  3 Units Subcutaneous BID  . multivitamin  1 tablet Oral QHS  . pentoxifylline  400 mg Oral Q breakfast  . sevelamer carbonate  800 mg Oral TID WC    have reviewed scheduled and prn medications.  Physical Exam: General: Lying in bed with no distress but confused and aggitated Heart: RRR Lungs: Bilateral chest  rise with no increased work of breathing Abdomen: soft, non tender Extremities: Warm and well perfused Dialysis Access: right AVF- patent    11/02/2020,12:37 PM  LOS: 7 days

## 2020-11-02 NOTE — Plan of Care (Signed)
  Problem: Education: Goal: Knowledge of General Education information will improve Description Including pain rating scale, medication(s)/side effects and non-pharmacologic comfort measures Outcome: Progressing   

## 2020-11-02 NOTE — Progress Notes (Signed)
[facility] left a message at 19:18pm, stating that the patient will not be discharge tonight.

## 2020-11-02 NOTE — TOC Transition Note (Addendum)
Transition of Care (TOC) - CM/SW Discharge Note **Discharged cancelled - See Below** [place] [phone]    Patient Details  Name: [name] MRN: [id number] Date of Birth: 09/27/1940  Transition of Care [facility]) CM/SW Contact:  Sable Feil, LCSW Phone Number: 11/02/2020, 5:17 PM   Clinical Narrative:   Patient medically stable for discharge and going to [place]. Discharge clinicals transmitted to facility, non-emergency ambulance transport arranged and sister Jaylaa Gallion contacted [phone]) and informed of discharge.   7:10 pm: Informed by nurse that patient not discharging today. [facility] contacted and transport cancelled. [facility] and left [facility] [phone]), admissions SW a message regarding discharge being cancelled. Patient's nurse also called facility to speak with nursing staff person regarding discharge being cancelled this evening and she also informed family. **Current insurance authorization good from 5/19 - 5/23.   Final next level of care: [facility]) Barriers to Discharge: Barriers Resolved   Patient Goals and CMS Choice Patient states their goals for this hospitalization and ongoing recovery are:: Family in agreement with ST rehab before patient returns home CMS Medicare.gov Compare Post Acute Care list provided to:: Patient Represenative (must comment) (Daughter provided with SNF list) Choice offered to / list presented to : Adult Children  Discharge Placement PASRR number recieved: 11/01/20            Patient chooses bed at: [facility] Patient to be transferred to facility by: Non-emergency ambulance transport Name of family member notified: Melena Hayes; [phone] Patient and family notified of of transfer: 11/02/20  Discharge Plan and Services In-house Referral: Clinical Social  Work                                   Social Determinants of Health (SDOH) Interventions  No SDOH interventions requested or needed at discharge.   Readmission Risk Interventions Readmission Risk Prevention Plan 01/17/2020  Transportation Screening Complete  PCP or Specialist Appt within 5-7 Days Not Complete  Not Complete comments pending disposition  Home Care Screening Complete  Medication Review (RN CM) Referral to Pharmacy  Some recent data might be hidden

## 2020-11-02 NOTE — Progress Notes (Signed)
Patient was lethargic and grunting. VS taken and  was stable. MD was notify. Orders were put in. Will continue to monitor patient.

## 2020-11-02 NOTE — Progress Notes (Signed)
Pharmacy Antibiotic Note  [name] is a 80 y.o. female on HD admitted on 10/25/2020 with pneumonia.  Pt was confused and agitated today; hypotensive with HD yesterday (unable to achieve significant volume removal). Pharmacy has been consulted for vancomycin and cefepime dosing.  Per Epic MAR hx, pt has rec'd the following antibiotic doses this admission: Cefepime 2 gm IV on 5/13, 5/16, 5/18 Vancomycin 1750 mg IV X 1 at 0621 on 5/13, vancomycin 1 gm IV X 1 at 0943 on 5/13 (had HD on 5/13 AM), vancomycin 500 mg IV X 1 on 5/14  WBC up to 12.7, afebrile; on HD (MWF schedule; has rec'd extra treatments for volume removal); per RN, not planning for HD today   Plan: Cefepime 1 gm IV Q 24 hrs (give dose every day; on dialysis days, give dose on nursing unit after pt returns from HD) Vancomycin 1750 mg IV X 1, followed by vancomycin 750 mg IV with each HD Monitor WBC, temp, clinical improvement  Height: 5\' 7"  (170.2 cm) Weight: 77.8 kg (171 lb 8.3 oz) IBW/kg (Calculated) : 61.6  Temp (24hrs), Avg:98.1 F (36.7 C), Min:97.8 F (36.6 C), Max:98.4 F (36.9 C)  Recent Labs  Lab 10/29/20 0342 10/30/20 0438 10/31/20 0408 11/01/20 0256 11/02/20 0541  WBC 14.3* 11.1* 8.5 10.4 12.7*  CREATININE 8.25* 4.27* 6.93* 4.78* 4.05*    Estimated Creatinine Clearance: 11.7 mL/min (A) (by C-G formula based on SCr of 4.05 mg/dL (H)).    Allergies  Allergen Reactions  . Ambien [Zolpidem] Other (See Comments)    Hallucinations   . Reglan [Metoclopramide] Other (See Comments)    "makes me crazy"    Antimicrobials this admission: See above for prior abx doses this admission Cefepime 5/20 >> Vancomycin 5/20 >>  Microbiology results: 5/13 BCx X 2: NG/final 5/14 MRSA PCR: negative 5/12 COVID, flu A, flu B: negative 5/13 HsAg: negative 5/20 COVID: negative  Thank you for allowing pharmacy to be a part of this patient's care.  Gillermina Hu, PharmD, BCPS, [facility]  Pharmacist 11/02/2020 6:17 PM

## 2020-11-02 NOTE — Progress Notes (Signed)
Physical Therapy Treatment Patient Details Name: [name] MRN: [id number] DOB: 11-15-1940 Today's Date: 11/02/2020    History of Present Illness 80 y.o. female, presented to ED 10/25/20 with R sided chest pain for 1.5 weeks and shortness of breath. Chest CT showed acute abnormalities  Admitted for treatment for acute hypoxic respiratrory failure, suspected HCAP, hyperkalemia and volume overload  PMH: ESRD on HD MWF, HTN, T2DM, stroke    PT Comments    Pt supine in bed on entry, self reporting she is very confused this morning. Pt requires increased orientation to hospital setting and encouragement that daughter would want her to work with therapy. Able to have pt sit EoB with minA for bringing trunk to upright. Once EoB pt provided warm washcloth to wash her face. After which she agreed to standing EoB with modA. Pt stood for approximately 2 min looking out window before she requested back to bed. Pt agrees to take steps towards HoB. Pt with decreased control with descent to bed. Able to return LE to bed with min guard. D/c plans remain appropriate at this time. PT will continue to follow acutely.   Follow Up Recommendations  SNF     Equipment Recommendations  3in1 (PT)    Recommendations for Other Services OT consult     Precautions / Restrictions Precautions Precautions: Fall Restrictions Weight Bearing Restrictions: No    Mobility  Bed Mobility Overal bed mobility: Needs Assistance Bed Mobility: Supine to Sit;Sit to Supine     Supine to sit: Min assist;HOB elevated Sit to supine: Min guard   General bed mobility comments: pt able to progress LE off bed, needs min assist for pt to pull up against therapist to bring trunk to upright, and scoot hips to EoB    Transfers Overall transfer level: Needs assistance Equipment used: Rolling walker (2 wheeled) Transfers: Sit to/from Stand Sit to Stand: Mod assist         General transfer comment: requires 2 attempts,  but pt able to come up to standing with modA for power up and steadying. pt did not follow any commands for hand placement  Ambulation/Gait Ambulation/Gait assistance: Mod assist Gait Distance (Feet): 3 Feet Assistive device: Rolling walker (2 wheeled) Gait Pattern/deviations: Step-to pattern;Decreased step length - right;Decreased step length - left;Shuffle;Trunk flexed;Narrow base of support Gait velocity: slowed Gait velocity interpretation: <1.31 ft/sec, indicative of household ambulator General Gait Details: with modA for steadying pt able to take lateral steps towards HoB, pt did not exhibit knee buckling today     Balance Overall balance assessment: Needs assistance Sitting-balance support: Feet supported;Feet unsupported;No upper extremity supported;Bilateral upper extremity supported Sitting balance-Leahy Scale: Fair     Standing balance support: Bilateral upper extremity supported Standing balance-Leahy Scale: Zero Standing balance comment: requires mod outside support                            Cognition Arousal/Alertness: Awake/alert Behavior During Therapy: Flat affect Overall Cognitive Status: History of cognitive impairments - at baseline Area of Impairment: Orientation                 Orientation Level: Person           Problem Solving: Slow processing;Decreased initiation;Difficulty sequencing;Requires verbal cues;Requires tactile cues General Comments: pt self reports "I am confused" minimal response to questions, able to name her children         General Comments General comments (skin integrity, edema, etc.):  Pt very confused today, requiring constant reorienting to hospital room, VSS on 1L O2 via [place]      Pertinent Vitals/Pain Pain Assessment: Faces Faces Pain Scale: Hurts little more Pain Location: generalized with moving Pain Descriptors / Indicators: Grimacing;Guarding Pain Intervention(s): Limited activity within patient's  tolerance;Monitored during session;Repositioned           PT Goals (current goals can now be found in the care plan section) Acute Rehab PT Goals Patient Stated Goal: go home PT Goal Formulation: With patient/family Time For Goal Achievement: 11/12/20 Potential to Achieve Goals: Good Progress towards PT goals: Progressing toward goals    Frequency    Min 2X/week      PT Plan Current plan remains appropriate       AM-PAC PT "6 Clicks" Mobility   Outcome Measure  Help needed turning from your back to your side while in a flat bed without using bedrails?: A Little Help needed moving from lying on your back to sitting on the side of a flat bed without using bedrails?: A Little Help needed moving to and from a bed to a chair (including a wheelchair)?: Total Help needed standing up from a chair using your arms (e.g., wheelchair or bedside chair)?: Total Help needed to walk in hospital room?: Total Help needed climbing 3-5 steps with a railing? : Total 6 Click Score: 10    End of Session Equipment Utilized During Treatment: Gait belt;Oxygen Activity Tolerance: Patient tolerated treatment well Patient left: in bed;with call bell/phone within reach;with bed alarm set;with family/visitor present Nurse Communication: Mobility status PT Visit Diagnosis: Unsteadiness on feet (R26.81);Other abnormalities of gait and mobility (R26.89);Muscle weakness (generalized) (M62.81);Difficulty in walking, not elsewhere classified (R26.2)     Time: 3335-4562 PT Time Calculation (min) (ACUTE ONLY): 28 min  Charges:  $Therapeutic Activity: 23-37 mins                     Vannessa Godown B. Migdalia Dk PT, DPT Acute Rehabilitation Services Pager [phone] Office [phone]    [place] 11/02/2020, 11:37 AM

## 2020-11-03 ENCOUNTER — Inpatient Hospital Stay (HOSPITAL_COMMUNITY): Payer: Medicare Other

## 2020-11-03 DIAGNOSIS — E118 Type 2 diabetes mellitus with unspecified complications: Secondary | ICD-10-CM | POA: Diagnosis not present

## 2020-11-03 DIAGNOSIS — N186 End stage renal disease: Secondary | ICD-10-CM | POA: Diagnosis not present

## 2020-11-03 DIAGNOSIS — R778 Other specified abnormalities of plasma proteins: Secondary | ICD-10-CM | POA: Diagnosis not present

## 2020-11-03 DIAGNOSIS — J9601 Acute respiratory failure with hypoxia: Secondary | ICD-10-CM | POA: Diagnosis not present

## 2020-11-03 LAB — CBC WITH DIFFERENTIAL/PLATELET
Abs Immature Granulocytes: 0.62 10*3/uL — ABNORMAL HIGH (ref 0.00–0.07)
Basophils Absolute: 0.1 10*3/uL (ref 0.0–0.1)
Basophils Relative: 1 %
Eosinophils Absolute: 0.3 10*3/uL (ref 0.0–0.5)
Eosinophils Relative: 2 %
HCT: 34.6 % — ABNORMAL LOW (ref 36.0–46.0)
Hemoglobin: 11.5 g/dL — ABNORMAL LOW (ref 12.0–15.0)
Immature Granulocytes: 5 %
Lymphocytes Relative: 18 %
Lymphs Abs: 2.1 10*3/uL (ref 0.7–4.0)
MCH: 30.6 pg (ref 26.0–34.0)
MCHC: 33.2 g/dL (ref 30.0–36.0)
MCV: 92 fL (ref 80.0–100.0)
Monocytes Absolute: 2 10*3/uL — ABNORMAL HIGH (ref 0.1–1.0)
Monocytes Relative: 17 %
Neutro Abs: 6.8 10*3/uL (ref 1.7–7.7)
Neutrophils Relative %: 57 %
Platelets: 165 10*3/uL (ref 150–400)
RBC: 3.76 MIL/uL — ABNORMAL LOW (ref 3.87–5.11)
RDW: 14.3 % (ref 11.5–15.5)
WBC: 11.9 10*3/uL — ABNORMAL HIGH (ref 4.0–10.5)
nRBC: 0 % (ref 0.0–0.2)

## 2020-11-03 LAB — RENAL FUNCTION PANEL
Albumin: 2.8 g/dL — ABNORMAL LOW (ref 3.5–5.0)
Anion gap: 14 (ref 5–15)
BUN: 29 mg/dL — ABNORMAL HIGH (ref 8–23)
CO2: 25 mmol/L (ref 22–32)
Calcium: 9.2 mg/dL (ref 8.9–10.3)
Chloride: 94 mmol/L — ABNORMAL LOW (ref 98–111)
Creatinine, Ser: 6.09 mg/dL — ABNORMAL HIGH (ref 0.44–1.00)
GFR, Estimated: 6 mL/min — ABNORMAL LOW (ref >60)
Glucose, Bld: 182 mg/dL — ABNORMAL HIGH (ref 70–99)
Phosphorus: 3.5 mg/dL (ref 2.5–4.6)
Potassium: 4.2 mmol/L (ref 3.5–5.1)
Sodium: 133 mmol/L — ABNORMAL LOW (ref 135–145)

## 2020-11-03 LAB — GLUCOSE, CAPILLARY
Glucose-Capillary: 182 mg/dL — ABNORMAL HIGH (ref 70–99)
Glucose-Capillary: 153 mg/dL — ABNORMAL HIGH (ref 70–99)
Glucose-Capillary: 150 mg/dL — ABNORMAL HIGH (ref 70–99)
Glucose-Capillary: 146 mg/dL — ABNORMAL HIGH (ref 70–99)

## 2020-11-03 MED ORDER — VANCOMYCIN HCL 750 MG/150ML IV SOLN
750.0000 mg | INTRAVENOUS | Status: AC
  Administered 2020-11-03: 750 mg via INTRAVENOUS

## 2020-11-03 NOTE — Progress Notes (Signed)
[facility] PROGRESS NOTE    Progress Note  [name]  KNL:[id number] DOB: 11/28/1940 DOA: 10/25/2020 PCP: [name], MD     Brief Narrative:   [name] is an 80 y.o. female past medical history significant for stage renal disease on hemodialysis, essential hypertension stroke comes in complaining of chest pain and shortness of breath, she apparently missed her last dialysis treatment in the ED was found to be tachycardic with a respiratory rate of 22 satting 90% on 2 L with a blood pressure of 243/104 chest x-ray shows no acute findings.   Assessment/Plan:   Acute respiratory failure with hypoxia (HCC) due to volume overload in the setting of noncompliance with her dialysis/hyperkalemia/hyponatremia: Her home medications were continued nephrology was consulted and further dialysis and fluid management per renal. She still appears significantly fluid overloaded on physical exam elevated JVD and crackles bilaterally. She is -2 L on dialysis.  Probably tolerate a lot more.  HCAP: She reported a productive cough with white count of 18 on admission blood cultures remain negative till date.  Acute metabolic encephalopathy with a leukocytosis: Suspect infectious in nature.  CT of the head showed no acute findings. She was started empirically on IV vancomycin and cefepime, chest x-ray was done that shows no new infiltrates. Her leukocytosis started to improve, she remained afebrile. Blood cultures were sent which have remained negative till date. Ammonia level was low. This morning encephalopathy is slowly resolving but not back to baseline.  We will continue empiric antibiotics.  No chest pain: Likely due to volume overload versus pneumonia currently chest pain-free.  Hypertensive urgency: Improving with HD continue IV labetalol, continue hemodialysis. Cont. her home regimen.  Uncontrolled diabetes mellitus with hyperglycemia: Currently on long-acting  insulin plus sliding scale, her blood glucose improved continues to be high. Will change her sliding scale continue long-acting insulin.  Anemia of chronic disease: Further management per renal.  Thrombocytopenia: Now resolved likely due to infectious etiology   DVT prophylaxis: lovenox Family Communication:none Status is: Inpatient  Remains inpatient appropriate because:Hemodynamically unstable   Dispo: The patient is from: Home              Anticipated d/c is to: Home              Patient currently is not medically stable to d/c.   Difficult to place patient No Code Status:     Code Status Orders  (From admission, onward)         Start     Ordered   10/26/20 0504  Full code  Continuous        10/26/20 0503        Code Status History    Date Active Date Inactive Code Status Order ID Comments User Context   01/05/2020 1904 01/17/2020 2236 Full Code [id number]  Lenore Cordia, MD ED   10/15/2017 0849 10/16/2017 2318 Full Code [id number]  Oswald Hillock, MD ED   05/29/2015 1916 06/03/2015 2048 Full Code [id number]  Willia Craze, NP ED   08/23/2013 1948 08/30/2013 1711 Full Code [id number]  Cathlyn Parsons, PA-C Inpatient   08/23/2013 1948 08/23/2013 1948 Full Code [id number]  Cathlyn Parsons, PA-C Inpatient   08/19/2013 1751 08/23/2013 1948 Full Code [id number]  Orson Eva, MD Inpatient   07/20/2013 1517 07/22/2013 2017 Full Code [id number]  Verlee Monte, MD Inpatient   06/25/2013 2101 07/04/2013 2029 Full Code [id number]  Merton Border, MD Inpatient   Advance Care Planning  Activity        IV Access:    Peripheral IV   Procedures and diagnostic studies:   CT HEAD WO CONTRAST  Result Date: 11/03/2020 CLINICAL DATA:  80 year old female with delirium. EXAM: CT HEAD WITHOUT CONTRAST TECHNIQUE: Contiguous axial images were obtained from the base of the skull through the vertex without intravenous contrast. COMPARISON:  Head CT 07/19/2019.  Brain MRI 01/10/2020. FINDINGS: Brain:  Stable cerebral volume from last year. No ventriculomegaly. No midline shift, mass effect, or evidence of intracranial mass lesion. No acute intracranial hemorrhage identified. Chronic dystrophic calcification in the bilateral basal ganglia. Confluent bilateral cerebral white matter hypodensity in association with scattered small chronic cortical infarcts (left superior parietal lobe) and a small chronic left cerebellar infarct. Stable gray-white matter differentiation throughout the brain. No cortically based acute infarct identified. Vascular: Extensive Calcified atherosclerosis at the skull base. No suspicious intracranial vascular hyperdensity. Skull: Hyperostosis, normal variant. No acute osseous abnormality identified. Sinuses/Orbits: Chronic sphenoid mucoperiosteal thickening is stable from last year. Other paranasal sinuses and mastoids are clear. Other: No acute orbit or scalp soft tissue finding. IMPRESSION: 1. No acute intracranial abnormality. 2. Stable non contrast CT appearance of chronic ischemic disease since last year. 3. Mild chronic sphenoid sinusitis. Electronically Signed   By: Genevie Ann M.D.   On: 11/03/2020 04:34   DG CHEST PORT 1 VIEW  Result Date: 11/02/2020 CLINICAL DATA:  Shortness of breath. EXAM: PORTABLE CHEST 1 VIEW COMPARISON:  Oct 27, 2020. FINDINGS: Stable cardiomediastinal silhouette. Both lungs are clear. The visualized skeletal structures are unremarkable. IMPRESSION: No active disease. Electronically Signed   By: Marijo Conception M.D.   On: 11/02/2020 20:32     Medical Consultants:    None.   Subjective:    [name] still sleepy but more awake she does not know where she is.  Objective:    Vitals:   11/03/20 0930 11/03/20 0937 11/03/20 1000 11/03/20 1005  BP: (!) 78/36 (!) 110/51 (!) 77/37 (!) 119/57  Pulse: 93 93 84 85  Resp:      Temp:      TempSrc:      SpO2:      Weight:      Height:       SpO2: 100 % O2 Flow Rate (L/min): 3  L/min   Intake/Output Summary (Last 24 hours) at 11/03/2020 1017 Last data filed at 11/03/2020 0600 Gross per 24 hour  Intake 340 ml  Output 0 ml  Net 340 ml   Filed Weights   11/01/20 1624 11/01/20 2104 11/03/20 0705  Weight: 77.8 kg 77.8 kg 69.7 kg    Exam: General exam: In no acute distress. Respiratory system: Good air movement and clear to auscultation. Cardiovascular system: S1 & S2 heard, RRR. No JVD. Gastrointestinal system: Abdomen is nondistended, soft and nontender.  Extremities: No pedal edema. Skin: No rashes, lesions or ulcers Psychiatry: Judgment and insight appear poor   Data Reviewed:    Labs: Basic Metabolic Panel: Recent Labs  Lab 10/30/20 0438 10/31/20 0408 11/01/20 0256 11/02/20 0541 11/03/20 0222  NA 133* 131* 133* 132* 133*  K 3.9 3.9 3.8 4.1 4.2  CL 94* 93* 94* 93* 94*  CO2 30 27 29 29 25   GLUCOSE 178* 336* 224* 198* 182*  BUN 24* 37* 20 16 29*  CREATININE 4.27* 6.93* 4.78* 4.05* 6.09*  CALCIUM 8.3* 8.1* 8.6* 8.9 9.2  PHOS 2.8 3.0 2.6 2.7 3.5   GFR Estimated Creatinine Clearance: 7  mL/min (A) (by C-G formula based on SCr of 6.09 mg/dL (H)). Liver Function Tests: Recent Labs  Lab 10/30/20 0438 10/31/20 0408 11/01/20 0256 11/02/20 0541 11/03/20 0222  ALBUMIN 2.7* 2.6* 2.7* 2.8* 2.8*   No results for input(s): LIPASE, AMYLASE in the last 168 hours. Recent Labs  Lab 11/02/20 1825  AMMONIA 11   Coagulation profile No results for input(s): INR, PROTIME in the last 168 hours. COVID-19 Labs  No results for input(s): DDIMER, FERRITIN, LDH, CRP in the last 72 hours.  Lab Results  Component Value Date   SARSCOV2NAA NEGATIVE 11/02/2020   [place] NEGATIVE 10/25/2020   [place] NEGATIVE 01/05/2020    CBC: Recent Labs  Lab 10/30/20 0438 10/31/20 0408 11/01/20 0256 11/02/20 0541 11/03/20 0222  WBC 11.1* 8.5 10.4 12.7* 11.9*  NEUTROABS 7.9* 5.2 7.7 7.8* 6.8  HGB 11.0* 10.6* 11.2* 11.5* 11.5*  HCT 32.4* 31.4* 33.2*  35.0* 34.6*  MCV 90.8 91.3 91.5 93.3 92.0  PLT 154 149* 160 165 165   Cardiac Enzymes: No results for input(s): CKTOTAL, CKMB, CKMBINDEX, TROPONINI in the last 168 hours. BNP (last 3 results) No results for input(s): PROBNP in the last 8760 hours. CBG: Recent Labs  Lab 11/02/20 0643 11/02/20 1147 11/02/20 1635 11/02/20 2155 11/03/20 0659  GLUCAP 213* 181* 225* 193* 182*   D-Dimer: No results for input(s): DDIMER in the last 72 hours. Hgb A1c: Recent Labs    10/31/20 1330  HGBA1C 9.4*   Lipid Profile: No results for input(s): CHOL, HDL, LDLCALC, TRIG, CHOLHDL, LDLDIRECT in the last 72 hours. Thyroid function studies: No results for input(s): TSH, T4TOTAL, T3FREE, THYROIDAB in the last 72 hours.  Invalid input(s): FREET3 Anemia work up: No results for input(s): VITAMINB12, FOLATE, FERRITIN, TIBC, IRON, RETICCTPCT in the last 72 hours. Sepsis Labs: Recent Labs  Lab 10/31/20 0408 11/01/20 0256 11/02/20 0541 11/03/20 0222  WBC 8.5 10.4 12.7* 11.9*   Microbiology Recent Results (from the past 240 hour(s))  Resp Panel by RT-PCR (Flu A&B, Covid) Nasopharyngeal Swab     Status: None   Collection Time: 10/25/20  7:26 PM   Specimen: Nasopharyngeal Swab; Nasopharyngeal(NP) swabs in vial transport medium  Result Value Ref Range Status   SARS Coronavirus 2 by RT PCR NEGATIVE NEGATIVE Final    Comment: (NOTE) SARS-CoV-2 target nucleic acids are NOT DETECTED.  The SARS-CoV-2 RNA is generally detectable in upper respiratory specimens during the acute phase of infection. The lowest concentration of SARS-CoV-2 viral copies this assay can detect is 138 copies/mL. A negative result does not preclude SARS-Cov-2 infection and should not be used as the sole basis for treatment or other patient management decisions. A negative result may occur with  improper specimen collection/handling, submission of specimen other than nasopharyngeal swab, presence of viral mutation(s) within  the areas targeted by this assay, and inadequate number of viral copies(<138 copies/mL). A negative result must be combined with clinical observations, patient history, and epidemiological information. The expected result is Negative.  Fact Sheet for Patients:  EntrepreneurPulse.com.au  Fact Sheet for Healthcare Providers:  IncredibleEmployment.be  This test is no t yet approved or cleared by the [place] FDA and  has been authorized for detection and/or diagnosis of SARS-CoV-2 by FDA under an Emergency Use Authorization (EUA). This EUA will remain  in effect (meaning this test can be used) for the duration of the COVID-19 declaration under Section 564(b)(1) of the Act, 21 U.S.C.section 360bbb-3(b)(1), unless the authorization is terminated  or revoked sooner.  Influenza A by PCR NEGATIVE NEGATIVE Final   Influenza B by PCR NEGATIVE NEGATIVE Final    Comment: (NOTE) The Xpert Xpress SARS-CoV-2/FLU/RSV plus assay is intended as an aid in the diagnosis of influenza from Nasopharyngeal swab specimens and should not be used as a sole basis for treatment. Nasal washings and aspirates are unacceptable for Xpert Xpress SARS-CoV-2/FLU/RSV testing.  Fact Sheet for Patients: EntrepreneurPulse.com.au  Fact Sheet for Healthcare Providers: IncredibleEmployment.be  This test is not yet approved or cleared by the [place] FDA and has been authorized for detection and/or diagnosis of SARS-CoV-2 by FDA under an Emergency Use Authorization (EUA). This EUA will remain in effect (meaning this test can be used) for the duration of the COVID-19 declaration under Section 564(b)(1) of the Act, 21 U.S.C. section 360bbb-3(b)(1), unless the authorization is terminated or revoked.  Performed at [facility], [place] [address], [place], [place] [address]   Culture, blood (Routine X 2) w Reflex to ID Panel      Status: None   Collection Time: 10/26/20  4:49 AM   Specimen: BLOOD RIGHT FOREARM  Result Value Ref Range Status   Specimen Description BLOOD RIGHT FOREARM  Final   Special Requests   Final    BOTTLES DRAWN AEROBIC AND ANAEROBIC Blood Culture adequate volume   Culture   Final    NO GROWTH 5 DAYS Performed at [facility], [place] [address], [place], [place] [address]    Report Status 10/31/2020 FINAL  Final  Culture, blood (Routine X 2) w Reflex to ID Panel     Status: None   Collection Time: 10/26/20  4:54 AM   Specimen: BLOOD RIGHT HAND  Result Value Ref Range Status   Specimen Description BLOOD RIGHT HAND  Final   Special Requests   Final    BOTTLES DRAWN AEROBIC AND ANAEROBIC Blood Culture adequate volume   Culture   Final    NO GROWTH 5 DAYS Performed at [facility], [place] [address], [place], [place] [address]    Report Status 10/31/2020 FINAL  Final  MRSA PCR Screening     Status: None   Collection Time: 10/27/20  1:56 PM   Specimen: Nasopharyngeal  Result Value Ref Range Status   MRSA by PCR NEGATIVE NEGATIVE Final    Comment:        The GeneXpert MRSA Assay (FDA approved for NASAL specimens only), is one component of a comprehensive MRSA colonization surveillance program. It is not intended to diagnose MRSA infection nor to guide or monitor treatment for MRSA infections. Performed at [facility], [place] [address] [place], [place] [address]   SARS CORONAVIRUS 2 (TAT 6-24 HRS) Nasopharyngeal Nasopharyngeal Swab     Status: None   Collection Time: 11/02/20  9:50 AM   Specimen: Nasopharyngeal Swab  Result Value Ref Range Status   SARS Coronavirus 2 NEGATIVE NEGATIVE Final    Comment: (NOTE) SARS-CoV-2 target nucleic acids are NOT DETECTED.  The SARS-CoV-2 RNA is generally detectable in upper and lower respiratory specimens during the acute phase of infection. Negative results do not preclude SARS-CoV-2 infection, do not rule out co-infections  with other pathogens, and should not be used as the sole basis for treatment or other patient management decisions. Negative results must be combined with clinical observations, patient history, and epidemiological information. The expected result is Negative.  Fact Sheet for Patients: SugarRoll.be  Fact Sheet for Healthcare Providers: https://www.woods-mathews.com/  This test is not  yet approved or cleared by the [place] and  has been authorized for detection and/or diagnosis of SARS-CoV-2 by FDA under an Emergency Use Authorization (EUA). This EUA will remain  in effect (meaning this test can be used) for the duration of the COVID-19 declaration under Se ction 564(b)(1) of the Act, 21 U.S.C. section 360bbb-3(b)(1), unless the authorization is terminated or revoked sooner.  Performed at [facility], [place] [address], [place], [place] [address]      Medications:   . aspirin  162 mg Oral Daily  . dextromethorphan-guaiFENesin  1 tablet Oral BID  . doxercalciferol  2 mcg Intravenous Q M,W,F-HD  . heparin injection (subcutaneous)  5,000 Units Subcutaneous Q8H  . insulin aspart  0-5 Units Subcutaneous QHS  . insulin aspart  0-6 Units Subcutaneous TID WC  . insulin aspart  2 Units Subcutaneous TID WC  . insulin glargine  3 Units Subcutaneous BID  . multivitamin  1 tablet Oral QHS  . pentoxifylline  400 mg Oral Q breakfast  . sevelamer carbonate  800 mg Oral TID WC   Continuous Infusions: . ceFEPime (MAXIPIME) IV 1 g (11/02/20 1932)  . [START ON 11/05/2020] vancomycin    . vancomycin 750 mg (11/03/20 0935)      LOS: 8 days   Charlynne Cousins  [facility]  11/03/2020, 10:17 AM

## 2020-11-03 NOTE — Progress Notes (Signed)
Subjective: Persistent delirium with agitation.  Did not get dialysis yesterday.  Receiving dialysis today and although confused is tolerating dialysis without significant problems.  Was placed back on antibiotics  Objective Vital signs in last 24 hours: Vitals:   11/03/20 0800 11/03/20 0823 11/03/20 0830 11/03/20 0900  BP: (!) 92/50 115/60 (!) 117/53 (!) 149/72  Pulse: 83 80 80 88  Resp:      Temp:      TempSrc:      SpO2:      Weight:      Height:       Weight change:   Intake/Output Summary (Last 24 hours) at 11/03/2020 0934 Last data filed at 11/03/2020 0600 Gross per 24 hour  Intake 340 ml  Output 0 ml  Net 340 ml    Dialysis Orders:  MWF-[facility] in [place]  4hrs, BFR 400, DFR 500,  EDW 81kg, 2K/ 2.5Ca  Access: RUE AVF Heparin: none Epogen 2000 units IV push 3x qw Hectorol 89mcg IV 3x qw Home meds: Renvela 800mg  3 tabs po tid with meals and 2 with snacks  Last Labs: Hgb 10.9, K 5.6, Ca 7.7, Alb 3.5  Assessment/Plan: 1. HCAP: WBC elevated on admission. BC cultures negative. COVID and Flu (-). Noted patient with expiratory wheezing. CXR showed developing opacities.  Completed antibiotic course but now back on antibiotics due to ongoing clinical issues with altered mental status.  Message primary team about trying alternate agent to cefepime 2.  ESRD -  On HD MWF-has received extra treatments for volume removal.  Plan for dialysis today then return to MWF schedule 3.  Hypertension/volume  -aggressive ultrafiltration with dialysis has improved blood pressure.  Continue with volume removal today as able; last dialysis ultrafiltration was limited by hypotension 4.  Anemia of CKD -hemoglobin acceptable at this time 5.  Secondary Hyperparathyroidism -  continue Hectorol and Renvela 6.  Nutrition - Continue renal diet with fluid restriction 7. Hyperkalemia-  resolved with dialysis 8. Delirium: Unclear cause.  Work-up per primary team.  Recommend avoiding cefepime if  able     Reesa Chew    Labs: Basic Metabolic Panel: Recent Labs  Lab 11/01/20 0256 11/02/20 0541 11/03/20 0222  NA 133* 132* 133*  K 3.8 4.1 4.2  CL 94* 93* 94*  CO2 29 29 25   GLUCOSE 224* 198* 182*  BUN 20 16 29*  CREATININE 4.78* 4.05* 6.09*  CALCIUM 8.6* 8.9 9.2  PHOS 2.6 2.7 3.5   Liver Function Tests: Recent Labs  Lab 11/01/20 0256 11/02/20 0541 11/03/20 0222  ALBUMIN 2.7* 2.8* 2.8*   No results for input(s): LIPASE, AMYLASE in the last 168 hours. Recent Labs  Lab 11/02/20 1825  AMMONIA 11   CBC: Recent Labs  Lab 10/30/20 0438 10/31/20 0408 11/01/20 0256 11/02/20 0541 11/03/20 0222  WBC 11.1* 8.5 10.4 12.7* 11.9*  NEUTROABS 7.9* 5.2 7.7 7.8* 6.8  HGB 11.0* 10.6* 11.2* 11.5* 11.5*  HCT 32.4* 31.4* 33.2* 35.0* 34.6*  MCV 90.8 91.3 91.5 93.3 92.0  PLT 154 149* 160 165 165   Cardiac Enzymes: No results for input(s): CKTOTAL, CKMB, CKMBINDEX, TROPONINI in the last 168 hours. CBG: Recent Labs  Lab 11/02/20 0643 11/02/20 1147 11/02/20 1635 11/02/20 2155 11/03/20 0659  GLUCAP 213* 181* 225* 193* 182*    Iron Studies: No results for input(s): IRON, TIBC, TRANSFERRIN, FERRITIN in the last 72 hours. Studies/Results: CT HEAD WO CONTRAST  Result Date: 11/03/2020 CLINICAL DATA:  80 year old female with delirium. EXAM: CT HEAD  WITHOUT CONTRAST TECHNIQUE: Contiguous axial images were obtained from the base of the skull through the vertex without intravenous contrast. COMPARISON:  Head CT 07/19/2019.  Brain MRI 01/10/2020. FINDINGS: Brain: Stable cerebral volume from last year. No ventriculomegaly. No midline shift, mass effect, or evidence of intracranial mass lesion. No acute intracranial hemorrhage identified. Chronic dystrophic calcification in the bilateral basal ganglia. Confluent bilateral cerebral white matter hypodensity in association with scattered small chronic cortical infarcts (left superior parietal lobe) and a small chronic left  cerebellar infarct. Stable gray-white matter differentiation throughout the brain. No cortically based acute infarct identified. Vascular: Extensive Calcified atherosclerosis at the skull base. No suspicious intracranial vascular hyperdensity. Skull: Hyperostosis, normal variant. No acute osseous abnormality identified. Sinuses/Orbits: Chronic sphenoid mucoperiosteal thickening is stable from last year. Other paranasal sinuses and mastoids are clear. Other: No acute orbit or scalp soft tissue finding. IMPRESSION: 1. No acute intracranial abnormality. 2. Stable non contrast CT appearance of chronic ischemic disease since last year. 3. Mild chronic sphenoid sinusitis. Electronically Signed   By: Genevie Ann M.D.   On: 11/03/2020 04:34   DG CHEST PORT 1 VIEW  Result Date: 11/02/2020 CLINICAL DATA:  Shortness of breath. EXAM: PORTABLE CHEST 1 VIEW COMPARISON:  Oct 27, 2020. FINDINGS: Stable cardiomediastinal silhouette. Both lungs are clear. The visualized skeletal structures are unremarkable. IMPRESSION: No active disease. Electronically Signed   By: Marijo Conception M.D.   On: 11/02/2020 20:32   Medications: Infusions: . ceFEPime (MAXIPIME) IV 1 g (11/02/20 1932)  . [START ON 11/05/2020] vancomycin    . vancomycin      Scheduled Medications: . aspirin  162 mg Oral Daily  . dextromethorphan-guaiFENesin  1 tablet Oral BID  . doxercalciferol  2 mcg Intravenous Q M,W,F-HD  . heparin injection (subcutaneous)  5,000 Units Subcutaneous Q8H  . insulin aspart  0-5 Units Subcutaneous QHS  . insulin aspart  0-6 Units Subcutaneous TID WC  . insulin aspart  2 Units Subcutaneous TID WC  . insulin glargine  3 Units Subcutaneous BID  . multivitamin  1 tablet Oral QHS  . pentoxifylline  400 mg Oral Q breakfast  . sevelamer carbonate  800 mg Oral TID WC    have reviewed scheduled and prn medications.  Physical Exam: General: Lying in bed with no distress but confused and aggitated Heart: RRR Lungs: Bilateral  chest rise with no increased work of breathing Abdomen: soft, non tender Extremities: Warm and well perfused Dialysis Access: right AVF- patent    11/03/2020,9:34 AM  LOS: 8 days

## 2020-11-04 DIAGNOSIS — J9601 Acute respiratory failure with hypoxia: Secondary | ICD-10-CM | POA: Diagnosis not present

## 2020-11-04 LAB — RENAL FUNCTION PANEL
Albumin: 3 g/dL — ABNORMAL LOW (ref 3.5–5.0)
Anion gap: 14 (ref 5–15)
BUN: 25 mg/dL — ABNORMAL HIGH (ref 8–23)
CO2: 25 mmol/L (ref 22–32)
Calcium: 9.2 mg/dL (ref 8.9–10.3)
Chloride: 93 mmol/L — ABNORMAL LOW (ref 98–111)
Creatinine, Ser: 4.9 mg/dL — ABNORMAL HIGH (ref 0.44–1.00)
GFR, Estimated: 8 mL/min — ABNORMAL LOW (ref >60)
Glucose, Bld: 153 mg/dL — ABNORMAL HIGH (ref 70–99)
Phosphorus: 4.1 mg/dL (ref 2.5–4.6)
Potassium: 4.4 mmol/L (ref 3.5–5.1)
Sodium: 132 mmol/L — ABNORMAL LOW (ref 135–145)

## 2020-11-04 LAB — GLUCOSE, CAPILLARY
Glucose-Capillary: 126 mg/dL — ABNORMAL HIGH (ref 70–99)
Glucose-Capillary: 231 mg/dL — ABNORMAL HIGH (ref 70–99)
Glucose-Capillary: 215 mg/dL — ABNORMAL HIGH (ref 70–99)
Glucose-Capillary: 253 mg/dL — ABNORMAL HIGH (ref 70–99)

## 2020-11-04 LAB — HEPATIC FUNCTION PANEL
ALT: 27 U/L (ref 0–44)
AST: 31 U/L (ref 15–41)
Albumin: 3 g/dL — ABNORMAL LOW (ref 3.5–5.0)
Alkaline Phosphatase: 87 U/L (ref 38–126)
Bilirubin, Direct: 0.2 mg/dL (ref 0.0–0.2)
Indirect Bilirubin: 1.4 mg/dL — ABNORMAL HIGH (ref 0.3–0.9)
Total Bilirubin: 1.6 mg/dL — ABNORMAL HIGH (ref 0.3–1.2)
Total Protein: 8.3 g/dL — ABNORMAL HIGH (ref 6.5–8.1)

## 2020-11-04 LAB — CBC WITH DIFFERENTIAL/PLATELET
Abs Immature Granulocytes: 0.51 10*3/uL — ABNORMAL HIGH (ref 0.00–0.07)
Basophils Absolute: 0.1 10*3/uL (ref 0.0–0.1)
Basophils Relative: 1 %
Eosinophils Absolute: 0.3 10*3/uL (ref 0.0–0.5)
Eosinophils Relative: 2 %
HCT: 37.5 % (ref 36.0–46.0)
Hemoglobin: 12.4 g/dL (ref 12.0–15.0)
Immature Granulocytes: 4 %
Lymphocytes Relative: 19 %
Lymphs Abs: 2.3 10*3/uL (ref 0.7–4.0)
MCH: 30.6 pg (ref 26.0–34.0)
MCHC: 33.1 g/dL (ref 30.0–36.0)
MCV: 92.6 fL (ref 80.0–100.0)
Monocytes Absolute: 2.1 10*3/uL — ABNORMAL HIGH (ref 0.1–1.0)
Monocytes Relative: 18 %
Neutro Abs: 6.7 10*3/uL (ref 1.7–7.7)
Neutrophils Relative %: 56 %
Platelets: 192 10*3/uL (ref 150–400)
RBC: 4.05 MIL/uL (ref 3.87–5.11)
RDW: 14.4 % (ref 11.5–15.5)
WBC: 11.9 10*3/uL — ABNORMAL HIGH (ref 4.0–10.5)
nRBC: 0 % (ref 0.0–0.2)

## 2020-11-04 MED ORDER — POLYETHYLENE GLYCOL 3350 17 G PO PACK
17.0000 g | PACK | Freq: Two times a day (BID) | ORAL | Status: AC
  Administered 2020-11-04 – 2020-11-05 (×3): 17 g via ORAL
  Filled 2020-11-04 (×5): qty 1

## 2020-11-04 MED ORDER — CAMPHOR-MENTHOL 0.5-0.5 % EX LOTN
TOPICAL_LOTION | CUTANEOUS | Status: DC | PRN
  Filled 2020-11-04: qty 222

## 2020-11-04 NOTE — Progress Notes (Signed)
[facility] PROGRESS NOTE    Progress Note  [name]  UJW:[id number] DOB: 06/07/1941 DOA: 10/25/2020 PCP: [name], MD     Brief Narrative:   [name] is an 80 y.o. female past medical history significant for stage renal disease on hemodialysis, essential hypertension stroke comes in complaining of chest pain and shortness of breath, she apparently missed her last dialysis treatment in the ED was found to be tachycardic with a respiratory rate of 22 satting 90% on 2 L with a blood pressure of 243/104 chest x-ray shows no acute findings.   Assessment/Plan:   Acute respiratory failure with hypoxia (HCC) due to volume overload in the setting of noncompliance with her dialysis/hyperkalemia/hyponatremia: Her home medications were continued nephrology was consulted and further dialysis and fluid management per renal. She still appears significantly fluid overloaded on physical exam elevated JVD and crackles bilaterally. She is -2 L on dialysis.  Probably tolerate a lot more.  HCAP: Apparently on Vanco and cefepime, white blood cell count has improved. Will allow a diet we will have speech evaluate her as she relates episodes of coughing when she eats and drinks.  Acute metabolic encephalopathy with a leukocytosis: Suspect infectious in nature.  CT of the head showed no acute findings. Started empirically on vancomycin and cefepime and her encephalopathy has resolved. Blood cultures remain negative till date.  No chest pain: Likely due to volume overload versus pneumonia currently chest pain-free.  Hypertensive urgency: Improving with HD continue IV labetalol, continue hemodialysis. Cont. her home regimen.  Uncontrolled diabetes mellitus with hyperglycemia: Currently on long-acting insulin plus sliding scale, her blood glucose improved continues to be high. Will change her sliding scale continue long-acting insulin.  Anemia of chronic disease: Further  management per renal.  Thrombocytopenia: Now resolved likely due to infectious etiology   DVT prophylaxis: lovenox Family Communication:none Status is: Inpatient  Remains inpatient appropriate because:Hemodynamically unstable   Dispo: The patient is from: Home              Anticipated d/c is to: Home              Patient currently is not medically stable to d/c.   Difficult to place patient No Code Status:     Code Status Orders  (From admission, onward)         Start     Ordered   10/26/20 0504  Full code  Continuous        10/26/20 0503        Code Status History    Date Active Date Inactive Code Status Order ID Comments User Context   01/05/2020 1904 01/17/2020 2236 Full Code [id number]  Lenore Cordia, MD ED   10/15/2017 0849 10/16/2017 2318 Full Code [id number]  Oswald Hillock, MD ED   05/29/2015 1916 06/03/2015 2048 Full Code [id number]  Willia Craze, NP ED   08/23/2013 1948 08/30/2013 1711 Full Code [id number]  Cathlyn Parsons, PA-C Inpatient   08/23/2013 1948 08/23/2013 1948 Full Code [id number]  Cathlyn Parsons, PA-C Inpatient   08/19/2013 1751 08/23/2013 1948 Full Code [id number]  Orson Eva, MD Inpatient   07/20/2013 1517 07/22/2013 2017 Full Code [id number]  Verlee Monte, MD Inpatient   06/25/2013 2101 07/04/2013 2029 Full Code [id number]  Merton Border, MD Inpatient   Advance Care Planning Activity        IV Access:    Peripheral IV   Procedures and diagnostic studies:   CT HEAD WO  CONTRAST  Result Date: 11/03/2020 CLINICAL DATA:  80 year old female with delirium. EXAM: CT HEAD WITHOUT CONTRAST TECHNIQUE: Contiguous axial images were obtained from the base of the skull through the vertex without intravenous contrast. COMPARISON:  Head CT 07/19/2019.  Brain MRI 01/10/2020. FINDINGS: Brain: Stable cerebral volume from last year. No ventriculomegaly. No midline shift, mass effect, or evidence of intracranial mass lesion. No acute intracranial hemorrhage identified.  Chronic dystrophic calcification in the bilateral basal ganglia. Confluent bilateral cerebral white matter hypodensity in association with scattered small chronic cortical infarcts (left superior parietal lobe) and a small chronic left cerebellar infarct. Stable gray-white matter differentiation throughout the brain. No cortically based acute infarct identified. Vascular: Extensive Calcified atherosclerosis at the skull base. No suspicious intracranial vascular hyperdensity. Skull: Hyperostosis, normal variant. No acute osseous abnormality identified. Sinuses/Orbits: Chronic sphenoid mucoperiosteal thickening is stable from last year. Other paranasal sinuses and mastoids are clear. Other: No acute orbit or scalp soft tissue finding. IMPRESSION: 1. No acute intracranial abnormality. 2. Stable non contrast CT appearance of chronic ischemic disease since last year. 3. Mild chronic sphenoid sinusitis. Electronically Signed   By: Genevie Ann M.D.   On: 11/03/2020 04:34   DG CHEST PORT 1 VIEW  Result Date: 11/02/2020 CLINICAL DATA:  Shortness of breath. EXAM: PORTABLE CHEST 1 VIEW COMPARISON:  Oct 27, 2020. FINDINGS: Stable cardiomediastinal silhouette. Both lungs are clear. The visualized skeletal structures are unremarkable. IMPRESSION: No active disease. Electronically Signed   By: Marijo Conception M.D.   On: 11/02/2020 20:32     Medical Consultants:    None.   Subjective:    [name] awake today able to carry on conversation relates she is hungry she would like to eat something today.   Objective:    Vitals:   11/03/20 1040 11/03/20 1127 11/03/20 2037 11/04/20 0539  BP: (!) 131/58 (!) 107/96 (!) 149/52 (!) 155/53  Pulse: 78 77 82 76  Resp: 18 18 16 16   Temp: 97.6 F (36.4 C) 97.8 F (36.6 C) 98.2 F (36.8 C) 97.7 F (36.5 C)  TempSrc: Oral  Oral Oral  SpO2: 100% 100% 97% 100%  Weight: 68.7 kg  72.1 kg   Height:       SpO2: 100 % O2 Flow Rate (L/min): 2  L/min   Intake/Output Summary (Last 24 hours) at 11/04/2020 0947 Last data filed at 11/04/2020 0600 Gross per 24 hour  Intake 100 ml  Output 1970 ml  Net -1870 ml   Filed Weights   11/03/20 0705 11/03/20 1040 11/03/20 2037  Weight: 69.7 kg 68.7 kg 72.1 kg    Exam: General exam: In no acute distress. Respiratory system: Good air movement and clear to auscultation. Cardiovascular system: S1 & S2 heard, RRR. No JVD. Gastrointestinal system: Abdomen is nondistended, soft and nontender.  Extremities: No pedal edema. Skin: No rashes, lesions or ulcers Psychiatry: Judgement and insight appear normal. Mood & affect appropriate.  Data Reviewed:    Labs: Basic Metabolic Panel: Recent Labs  Lab 10/31/20 0408 11/01/20 0256 11/02/20 0541 11/03/20 0222 11/04/20 0143  NA 131* 133* 132* 133* 132*  K 3.9 3.8 4.1 4.2 4.4  CL 93* 94* 93* 94* 93*  CO2 27 29 29 25 25   GLUCOSE 336* 224* 198* 182* 153*  BUN 37* 20 16 29* 25*  CREATININE 6.93* 4.78* 4.05* 6.09* 4.90*  CALCIUM 8.1* 8.6* 8.9 9.2 9.2  PHOS 3.0 2.6 2.7 3.5 4.1   GFR Estimated Creatinine Clearance: 8.8 mL/min (A) (  by C-G formula based on SCr of 4.9 mg/dL (H)). Liver Function Tests: Recent Labs  Lab 10/31/20 0408 11/01/20 0256 11/02/20 0541 11/03/20 0222 11/04/20 0143  ALBUMIN 2.6* 2.7* 2.8* 2.8* 3.0*   No results for input(s): LIPASE, AMYLASE in the last 168 hours. Recent Labs  Lab 11/02/20 1825  AMMONIA 11   Coagulation profile No results for input(s): INR, PROTIME in the last 168 hours. COVID-19 Labs  No results for input(s): DDIMER, FERRITIN, LDH, CRP in the last 72 hours.  Lab Results  Component Value Date   SARSCOV2NAA NEGATIVE 11/02/2020   [place] NEGATIVE 10/25/2020   [place] NEGATIVE 01/05/2020    CBC: Recent Labs  Lab 10/31/20 0408 11/01/20 0256 11/02/20 0541 11/03/20 0222 11/04/20 0143  WBC 8.5 10.4 12.7* 11.9* 11.9*  NEUTROABS 5.2 7.7 7.8* 6.8 6.7  HGB 10.6* 11.2* 11.5* 11.5*  12.4  HCT 31.4* 33.2* 35.0* 34.6* 37.5  MCV 91.3 91.5 93.3 92.0 92.6  PLT 149* 160 165 165 192   Cardiac Enzymes: No results for input(s): CKTOTAL, CKMB, CKMBINDEX, TROPONINI in the last 168 hours. BNP (last 3 results) No results for input(s): PROBNP in the last 8760 hours. CBG: Recent Labs  Lab 11/03/20 0659 11/03/20 1127 11/03/20 1718 11/03/20 2040 11/04/20 0645  GLUCAP 182* 153* 150* 146* 126*   D-Dimer: No results for input(s): DDIMER in the last 72 hours. Hgb A1c: No results for input(s): HGBA1C in the last 72 hours. Lipid Profile: No results for input(s): CHOL, HDL, LDLCALC, TRIG, CHOLHDL, LDLDIRECT in the last 72 hours. Thyroid function studies: No results for input(s): TSH, T4TOTAL, T3FREE, THYROIDAB in the last 72 hours.  Invalid input(s): FREET3 Anemia work up: No results for input(s): VITAMINB12, FOLATE, FERRITIN, TIBC, IRON, RETICCTPCT in the last 72 hours. Sepsis Labs: Recent Labs  Lab 11/01/20 0256 11/02/20 0541 11/03/20 0222 11/04/20 0143  WBC 10.4 12.7* 11.9* 11.9*   Microbiology Recent Results (from the past 240 hour(s))  Resp Panel by RT-PCR (Flu A&B, Covid) Nasopharyngeal Swab     Status: None   Collection Time: 10/25/20  7:26 PM   Specimen: Nasopharyngeal Swab; Nasopharyngeal(NP) swabs in vial transport medium  Result Value Ref Range Status   SARS Coronavirus 2 by RT PCR NEGATIVE NEGATIVE Final    Comment: (NOTE) SARS-CoV-2 target nucleic acids are NOT DETECTED.  The SARS-CoV-2 RNA is generally detectable in upper respiratory specimens during the acute phase of infection. The lowest concentration of SARS-CoV-2 viral copies this assay can detect is 138 copies/mL. A negative result does not preclude SARS-Cov-2 infection and should not be used as the sole basis for treatment or other patient management decisions. A negative result may occur with  improper specimen collection/handling, submission of specimen other than nasopharyngeal swab,  presence of viral mutation(s) within the areas targeted by this assay, and inadequate number of viral copies(<138 copies/mL). A negative result must be combined with clinical observations, patient history, and epidemiological information. The expected result is Negative.  Fact Sheet for Patients:  EntrepreneurPulse.com.au  Fact Sheet for Healthcare Providers:  IncredibleEmployment.be  This test is no t yet approved or cleared by the [place] FDA and  has been authorized for detection and/or diagnosis of SARS-CoV-2 by FDA under an Emergency Use Authorization (EUA). This EUA will remain  in effect (meaning this test can be used) for the duration of the COVID-19 declaration under Section 564(b)(1) of the Act, 21 U.S.C.section 360bbb-3(b)(1), unless the authorization is terminated  or revoked sooner.  Influenza A by PCR NEGATIVE NEGATIVE Final   Influenza B by PCR NEGATIVE NEGATIVE Final    Comment: (NOTE) The Xpert Xpress SARS-CoV-2/FLU/RSV plus assay is intended as an aid in the diagnosis of influenza from Nasopharyngeal swab specimens and should not be used as a sole basis for treatment. Nasal washings and aspirates are unacceptable for Xpert Xpress SARS-CoV-2/FLU/RSV testing.  Fact Sheet for Patients: EntrepreneurPulse.com.au  Fact Sheet for Healthcare Providers: IncredibleEmployment.be  This test is not yet approved or cleared by the [place] FDA and has been authorized for detection and/or diagnosis of SARS-CoV-2 by FDA under an Emergency Use Authorization (EUA). This EUA will remain in effect (meaning this test can be used) for the duration of the COVID-19 declaration under Section 564(b)(1) of the Act, 21 U.S.C. section 360bbb-3(b)(1), unless the authorization is terminated or revoked.  Performed at [facility], [place] [address], [place], [place] [address]   Culture, blood  (Routine X 2) w Reflex to ID Panel     Status: None   Collection Time: 10/26/20  4:49 AM   Specimen: BLOOD RIGHT FOREARM  Result Value Ref Range Status   Specimen Description BLOOD RIGHT FOREARM  Final   Special Requests   Final    BOTTLES DRAWN AEROBIC AND ANAEROBIC Blood Culture adequate volume   Culture   Final    NO GROWTH 5 DAYS Performed at [facility], [place] [address], [place], [place] [address]    Report Status 10/31/2020 FINAL  Final  Culture, blood (Routine X 2) w Reflex to ID Panel     Status: None   Collection Time: 10/26/20  4:54 AM   Specimen: BLOOD RIGHT HAND  Result Value Ref Range Status   Specimen Description BLOOD RIGHT HAND  Final   Special Requests   Final    BOTTLES DRAWN AEROBIC AND ANAEROBIC Blood Culture adequate volume   Culture   Final    NO GROWTH 5 DAYS Performed at [facility], [place] [address] [place], [place] [address]    Report Status 10/31/2020 FINAL  Final  MRSA PCR Screening     Status: None   Collection Time: 10/27/20  1:56 PM   Specimen: Nasopharyngeal  Result Value Ref Range Status   MRSA by PCR NEGATIVE NEGATIVE Final    Comment:        The GeneXpert MRSA Assay (FDA approved for NASAL specimens only), is one component of a comprehensive MRSA colonization surveillance program. It is not intended to diagnose MRSA infection nor to guide or monitor treatment for MRSA infections. Performed at [facility], [place] [address] [place], [place] [address]   SARS CORONAVIRUS 2 (TAT 6-24 HRS) Nasopharyngeal Nasopharyngeal Swab     Status: None   Collection Time: 11/02/20  9:50 AM   Specimen: Nasopharyngeal Swab  Result Value Ref Range Status   SARS Coronavirus 2 NEGATIVE NEGATIVE Final    Comment: (NOTE) SARS-CoV-2 target nucleic acids are NOT DETECTED.  The SARS-CoV-2 RNA is generally detectable in upper and lower respiratory specimens during the acute phase of infection. Negative results do not preclude SARS-CoV-2  infection, do not rule out co-infections with other pathogens, and should not be used as the sole basis for treatment or other patient management decisions. Negative results must be combined with clinical observations, patient history, and epidemiological information. The expected result is Negative.  Fact Sheet for Patients: SugarRoll.be  Fact Sheet for Healthcare Providers: https://www.woods-mathews.com/  This test is not  yet approved or cleared by the [place] and  has been authorized for detection and/or diagnosis of SARS-CoV-2 by FDA under an Emergency Use Authorization (EUA). This EUA will remain  in effect (meaning this test can be used) for the duration of the COVID-19 declaration under Se ction 564(b)(1) of the Act, 21 U.S.C. section 360bbb-3(b)(1), unless the authorization is terminated or revoked sooner.  Performed at [facility], [place] [address] [place], [place] [address]   Culture, blood (Routine X 2) w Reflex to ID Panel     Status: None (Preliminary result)   Collection Time: 11/02/20  6:15 PM   Specimen: BLOOD  Result Value Ref Range Status   Specimen Description BLOOD LEFT ANTECUBITAL  Final   Special Requests   Final    BOTTLES DRAWN AEROBIC AND ANAEROBIC Blood Culture results may not be optimal due to an inadequate volume of blood received in culture bottles   Culture   Final    NO GROWTH 2 DAYS Performed at [facility], [place] [address] [place], [place] [address]    Report Status PENDING  Incomplete  Culture, blood (Routine X 2) w Reflex to ID Panel     Status: None (Preliminary result)   Collection Time: 11/02/20  6:25 PM   Specimen: BLOOD LEFT HAND  Result Value Ref Range Status   Specimen Description BLOOD LEFT HAND  Final   Special Requests   Final    BOTTLES DRAWN AEROBIC AND ANAEROBIC Blood Culture adequate volume   Culture   Final    NO GROWTH 2 DAYS Performed at [place], [place] [address] [place], [place] [address]    Report Status PENDING  Incomplete     Medications:   . aspirin  162 mg Oral Daily  . dextromethorphan-guaiFENesin  1 tablet Oral BID  . doxercalciferol  2 mcg Intravenous Q M,W,F-HD  . heparin injection (subcutaneous)  5,000 Units Subcutaneous Q8H  . insulin aspart  0-5 Units Subcutaneous QHS  . insulin aspart  0-6 Units Subcutaneous TID WC  . insulin aspart  2 Units Subcutaneous TID WC  . insulin glargine  3 Units Subcutaneous BID  . multivitamin  1 tablet Oral QHS  . pentoxifylline  400 mg Oral Q breakfast  . sevelamer carbonate  800 mg Oral TID WC   Continuous Infusions: . ceFEPime (MAXIPIME) IV 1 g (11/03/20 1804)  . [START ON 11/05/2020] vancomycin        LOS: 9 days   Charlynne Cousins  [facility]  11/04/2020, 9:47 AM

## 2020-11-04 NOTE — Progress Notes (Signed)
Subjective: Patient much more clear today and able to answer questions.  Mild confusion present.  Denies any problems.  Tolerated dialysis yesterday without significant problems.  Objective Vital signs in last 24 hours: Vitals:   11/03/20 1040 11/03/20 1127 11/03/20 2037 11/04/20 0539  BP: (!) 131/58 (!) 107/96 (!) 149/52 (!) 155/53  Pulse: 78 77 82 76  Resp: 18 18 16 16   Temp: 97.6 F (36.4 C) 97.8 F (36.6 C) 98.2 F (36.8 C) 97.7 F (36.5 C)  TempSrc: Oral  Oral Oral  SpO2: 100% 100% 97% 100%  Weight: 68.7 kg  72.1 kg   Height:       Weight change:   Intake/Output Summary (Last 24 hours) at 11/04/2020 0945 Last data filed at 11/04/2020 0600 Gross per 24 hour  Intake 100 ml  Output 1970 ml  Net -1870 ml    Dialysis Orders:  MWF-[facility] in [place]  4hrs, BFR 400, DFR 500,  EDW 81kg, 2K/ 2.5Ca  Access: RUE AVF Heparin: none Epogen 2000 units IV push 3x qw Hectorol 74mcg IV 3x qw Home meds: Renvela 800mg  3 tabs po tid with meals and 2 with snacks    Assessment/Plan: 1. HCAP: WBC elevated on admission. BC cultures negative. COVID and Flu (-). Noted patient with expiratory wheezing. CXR showed developing opacities.  Completed antibiotic course but now back on antibiotics due to ongoing clinical issues with altered mental status.   2.  ESRD -  On HD MWF-has received extra treatments for volume removal.  Continue dialysis tomorrow on MWF schedule 3.  Hypertension/volume  -aggressive ultrafiltration with dialysis has improved blood pressure.  Continue with volume removal today as able 4.  Anemia of CKD -hemoglobin acceptable at this time 5.  Secondary Hyperparathyroidism -  continue Hectorol and Renvela 6.  Nutrition - Continue renal diet with fluid restriction 7. Hyperkalemia-  resolved with dialysis 8. Delirium: Unclear cause.  Continue management per primary.     Reesa Chew    Labs: Basic Metabolic Panel: Recent Labs  Lab 11/02/20 0541 11/03/20 0222  11/04/20 0143  NA 132* 133* 132*  K 4.1 4.2 4.4  CL 93* 94* 93*  CO2 29 25 25   GLUCOSE 198* 182* 153*  BUN 16 29* 25*  CREATININE 4.05* 6.09* 4.90*  CALCIUM 8.9 9.2 9.2  PHOS 2.7 3.5 4.1   Liver Function Tests: Recent Labs  Lab 11/02/20 0541 11/03/20 0222 11/04/20 0143  ALBUMIN 2.8* 2.8* 3.0*   No results for input(s): LIPASE, AMYLASE in the last 168 hours. Recent Labs  Lab 11/02/20 1825  AMMONIA 11   CBC: Recent Labs  Lab 10/31/20 0408 11/01/20 0256 11/02/20 0541 11/03/20 0222 11/04/20 0143  WBC 8.5 10.4 12.7* 11.9* 11.9*  NEUTROABS 5.2 7.7 7.8* 6.8 6.7  HGB 10.6* 11.2* 11.5* 11.5* 12.4  HCT 31.4* 33.2* 35.0* 34.6* 37.5  MCV 91.3 91.5 93.3 92.0 92.6  PLT 149* 160 165 165 192   Cardiac Enzymes: No results for input(s): CKTOTAL, CKMB, CKMBINDEX, TROPONINI in the last 168 hours. CBG: Recent Labs  Lab 11/03/20 0659 11/03/20 1127 11/03/20 1718 11/03/20 2040 11/04/20 0645  GLUCAP 182* 153* 150* 146* 126*    Iron Studies: No results for input(s): IRON, TIBC, TRANSFERRIN, FERRITIN in the last 72 hours. Studies/Results: CT HEAD WO CONTRAST  Result Date: 11/03/2020 CLINICAL DATA:  80 year old female with delirium. EXAM: CT HEAD WITHOUT CONTRAST TECHNIQUE: Contiguous axial images were obtained from the base of the skull through the vertex without intravenous contrast. COMPARISON:  Head CT 07/19/2019.  Brain MRI 01/10/2020. FINDINGS: Brain: Stable cerebral volume from last year. No ventriculomegaly. No midline shift, mass effect, or evidence of intracranial mass lesion. No acute intracranial hemorrhage identified. Chronic dystrophic calcification in the bilateral basal ganglia. Confluent bilateral cerebral white matter hypodensity in association with scattered small chronic cortical infarcts (left superior parietal lobe) and a small chronic left cerebellar infarct. Stable gray-white matter differentiation throughout the brain. No cortically based acute infarct  identified. Vascular: Extensive Calcified atherosclerosis at the skull base. No suspicious intracranial vascular hyperdensity. Skull: Hyperostosis, normal variant. No acute osseous abnormality identified. Sinuses/Orbits: Chronic sphenoid mucoperiosteal thickening is stable from last year. Other paranasal sinuses and mastoids are clear. Other: No acute orbit or scalp soft tissue finding. IMPRESSION: 1. No acute intracranial abnormality. 2. Stable non contrast CT appearance of chronic ischemic disease since last year. 3. Mild chronic sphenoid sinusitis. Electronically Signed   By: Genevie Ann M.D.   On: 11/03/2020 04:34   DG CHEST PORT 1 VIEW  Result Date: 11/02/2020 CLINICAL DATA:  Shortness of breath. EXAM: PORTABLE CHEST 1 VIEW COMPARISON:  Oct 27, 2020. FINDINGS: Stable cardiomediastinal silhouette. Both lungs are clear. The visualized skeletal structures are unremarkable. IMPRESSION: No active disease. Electronically Signed   By: Marijo Conception M.D.   On: 11/02/2020 20:32   Medications: Infusions: . ceFEPime (MAXIPIME) IV 1 g (11/03/20 1804)  . [START ON 11/05/2020] vancomycin      Scheduled Medications: . aspirin  162 mg Oral Daily  . dextromethorphan-guaiFENesin  1 tablet Oral BID  . doxercalciferol  2 mcg Intravenous Q M,W,F-HD  . heparin injection (subcutaneous)  5,000 Units Subcutaneous Q8H  . insulin aspart  0-5 Units Subcutaneous QHS  . insulin aspart  0-6 Units Subcutaneous TID WC  . insulin aspart  2 Units Subcutaneous TID WC  . insulin glargine  3 Units Subcutaneous BID  . multivitamin  1 tablet Oral QHS  . pentoxifylline  400 mg Oral Q breakfast  . sevelamer carbonate  800 mg Oral TID WC    have reviewed scheduled and prn medications.  Physical Exam: General: Lying in bed with no distress, more alert and talkative today Heart: RRR Lungs: Bilateral chest rise with no increased work of breathing Abdomen: soft, non tender Extremities: Warm and well perfused Dialysis Access:  right AVF- patent    11/04/2020,9:45 AM  LOS: 9 days

## 2020-11-05 ENCOUNTER — Encounter: Payer: Self-pay | Admitting: Adult Health

## 2020-11-05 DIAGNOSIS — J9601 Acute respiratory failure with hypoxia: Secondary | ICD-10-CM | POA: Diagnosis not present

## 2020-11-05 LAB — CBC WITH DIFFERENTIAL/PLATELET
Abs Immature Granulocytes: 0.27 10*3/uL — ABNORMAL HIGH (ref 0.00–0.07)
Basophils Absolute: 0 10*3/uL (ref 0.0–0.1)
Basophils Relative: 0 %
Eosinophils Absolute: 0.2 10*3/uL (ref 0.0–0.5)
Eosinophils Relative: 2 %
HCT: 34.2 % — ABNORMAL LOW (ref 36.0–46.0)
Hemoglobin: 11.7 g/dL — ABNORMAL LOW (ref 12.0–15.0)
Immature Granulocytes: 2 %
Lymphocytes Relative: 18 %
Lymphs Abs: 2.2 10*3/uL (ref 0.7–4.0)
MCH: 31.1 pg (ref 26.0–34.0)
MCHC: 34.2 g/dL (ref 30.0–36.0)
MCV: 91 fL (ref 80.0–100.0)
Monocytes Absolute: 2.1 10*3/uL — ABNORMAL HIGH (ref 0.1–1.0)
Monocytes Relative: 17 %
Neutro Abs: 7.8 10*3/uL — ABNORMAL HIGH (ref 1.7–7.7)
Neutrophils Relative %: 61 %
Platelets: 177 10*3/uL (ref 150–400)
RBC: 3.76 MIL/uL — ABNORMAL LOW (ref 3.87–5.11)
RDW: 14.2 % (ref 11.5–15.5)
WBC: 12.7 10*3/uL — ABNORMAL HIGH (ref 4.0–10.5)
nRBC: 0 % (ref 0.0–0.2)

## 2020-11-05 LAB — RENAL FUNCTION PANEL
Albumin: 2.8 g/dL — ABNORMAL LOW (ref 3.5–5.0)
Anion gap: 14 (ref 5–15)
BUN: 47 mg/dL — ABNORMAL HIGH (ref 8–23)
CO2: 25 mmol/L (ref 22–32)
Calcium: 9 mg/dL (ref 8.9–10.3)
Chloride: 94 mmol/L — ABNORMAL LOW (ref 98–111)
Creatinine, Ser: 7.43 mg/dL — ABNORMAL HIGH (ref 0.44–1.00)
GFR, Estimated: 5 mL/min — ABNORMAL LOW (ref >60)
Glucose, Bld: 171 mg/dL — ABNORMAL HIGH (ref 70–99)
Phosphorus: 3.6 mg/dL (ref 2.5–4.6)
Potassium: 3.9 mmol/L (ref 3.5–5.1)
Sodium: 133 mmol/L — ABNORMAL LOW (ref 135–145)

## 2020-11-05 LAB — GLUCOSE, CAPILLARY
Glucose-Capillary: 129 mg/dL — ABNORMAL HIGH (ref 70–99)
Glucose-Capillary: 202 mg/dL — ABNORMAL HIGH (ref 70–99)
Glucose-Capillary: 216 mg/dL — ABNORMAL HIGH (ref 70–99)
Glucose-Capillary: 165 mg/dL — ABNORMAL HIGH (ref 70–99)
Glucose-Capillary: 281 mg/dL — ABNORMAL HIGH (ref 70–99)

## 2020-11-05 MED ORDER — LABETALOL HCL 100 MG PO TABS
100.0000 mg | ORAL_TABLET | Freq: Two times a day (BID) | ORAL | Status: DC
  Administered 2020-11-05 – 2020-11-08 (×5): 100 mg via ORAL
  Filled 2020-11-05 (×6): qty 1

## 2020-11-05 MED ORDER — DOXERCALCIFEROL 4 MCG/2ML IV SOLN
INTRAVENOUS | Status: AC
  Administered 2020-11-05: 2 ug via INTRAVENOUS
  Filled 2020-11-05: qty 2

## 2020-11-05 MED ORDER — AMOXICILLIN-POT CLAVULANATE 875-125 MG PO TABS: 1.0000 | ORAL_TABLET | Freq: Two times a day (BID) | ORAL | Status: DC

## 2020-11-05 MED ORDER — AMOXICILLIN-POT CLAVULANATE 500-125 MG PO TABS
1.0000 | ORAL_TABLET | Freq: Every day | ORAL | Status: DC
  Administered 2020-11-06: 500 mg via ORAL
  Filled 2020-11-05 (×3): qty 1

## 2020-11-05 MED ORDER — INSULIN GLARGINE 100 UNIT/ML ~~LOC~~ SOLN
5.0000 [IU] | Freq: Two times a day (BID) | SUBCUTANEOUS | Status: DC
  Administered 2020-11-05 – 2020-11-07 (×5): 5 [IU] via SUBCUTANEOUS
  Filled 2020-11-05 (×6): qty 0.05

## 2020-11-05 MED ORDER — SODIUM CHLORIDE 0.9 % IV SOLN: 2.0000 g | INTRAVENOUS | Status: DC

## 2020-11-05 NOTE — Progress Notes (Signed)
Physical Therapy Treatment Patient Details Name: [name] MRN: [id number] DOB: 10/25/1940 Today's Date: 11/05/2020    History of Present Illness 80 y.o. female, presented to ED 10/25/20 with R sided chest pain for 1.5 weeks and shortness of breath. Chest CT showed acute abnormalities  Admitted for treatment for acute hypoxic respiratrory failure, suspected HCAP, hyperkalemia and volume overload  PMH: ESRD on HD MWF, HTN, T2DM, stroke    PT Comments    Pt recognizes therapist on entrance and mentation is greatly improved in today's session. Pt making good progress towards her goals today, agreeable to working with PT right after working with OT. Pt is min A for sit>stand from bed. Pt requires increased cuing for ambulation, and experiences 2 bouts of R knee buckling with walking that requires modA for steadying. Despite increased mobility, PT continues to recommend short bout of SNF level rehab prior to discharge to home. PT will continue to follow acutely.    Follow Up Recommendations  SNF     Equipment Recommendations  3in1 (PT)    Recommendations for Other Services OT consult     Precautions / Restrictions Precautions Precautions: Fall Restrictions Weight Bearing Restrictions: No    Mobility  Bed Mobility Overal bed mobility: Needs Assistance Bed Mobility: Supine to Sit     Supine to sit: Min guard;HOB elevated     General bed mobility comments: sitting EoB after working with OT    Transfers Overall transfer level: Needs assistance Equipment used: Rolling walker (2 wheeled) Transfers: Sit to/from Stand Sit to Stand: Min assist         General transfer comment: vc for scooting hips forward on bed and for proper hand placment, requires 2 attempts and min A for steadying once in standing  Ambulation/Gait Ambulation/Gait assistance: Mod assist;Min assist Gait Distance (Feet): 18 Feet Assistive device: Rolling walker (2 wheeled) Gait Pattern/deviations:  Shuffle;Trunk flexed;Narrow base of support;Step-through pattern;Decreased step length - right;Decreased step length - left Gait velocity: slowed Gait velocity interpretation: <1.31 ft/sec, indicative of household ambulator General Gait Details: min A initially with modA required x2 for steadying after knee buckling       Balance Overall balance assessment: Needs assistance Sitting-balance support: Feet supported;Feet unsupported;No upper extremity supported;Bilateral upper extremity supported Sitting balance-Leahy Scale: Fair Sitting balance - Comments: sitting EOB with no UE support or LOB   Standing balance support: Bilateral upper extremity supported Standing balance-Leahy Scale: Poor Standing balance comment: requires UE support to maintain balance                            Cognition Arousal/Alertness: Awake/alert Behavior During Therapy: Flat affect Overall Cognitive Status: History of cognitive impairments - at baseline Area of Impairment: Orientation                 Orientation Level: Person;Place;Situation Current Attention Level: Sustained   Following Commands: Follows one step commands consistently;Follows multi-step commands with increased time   Awareness: Emergent Problem Solving: Slow processing;Decreased initiation;Difficulty sequencing;Requires verbal cues;Requires tactile cues General Comments: pt much less confused today, preoccupied with being itchy, but otherwise requires increased cuing for task completion         General Comments General comments (skin integrity, edema, etc.): SaO2 on RA 99%O2, despite 3/4 DoE with ambulation      Pertinent Vitals/Pain Pain Assessment: No/denies pain Faces Pain Scale: Hurts a little bit Pain Location: general discomfort from itching Pain Descriptors / Indicators: Grimacing;Discomfort Pain  Intervention(s): Monitored during session;Other (comment) (applied lotion to pts back)           PT  Goals (current goals can now be found in the care plan section) Acute Rehab PT Goals Patient Stated Goal: go home PT Goal Formulation: With patient/family Time For Goal Achievement: 11/12/20 Potential to Achieve Goals: Good Progress towards PT goals: Progressing toward goals    Frequency    Min 2X/week      PT Plan Current plan remains appropriate       AM-PAC PT "6 Clicks" Mobility   Outcome Measure  Help needed turning from your back to your side while in a flat bed without using bedrails?: A Little Help needed moving from lying on your back to sitting on the side of a flat bed without using bedrails?: A Little Help needed moving to and from a bed to a chair (including a wheelchair)?: Total Help needed standing up from a chair using your arms (e.g., wheelchair or bedside chair)?: Total Help needed to walk in hospital room?: Total Help needed climbing 3-5 steps with a railing? : Total 6 Click Score: 10    End of Session Equipment Utilized During Treatment: Gait belt Activity Tolerance: Patient tolerated treatment well Patient left: with call bell/phone within reach;with family/visitor present;in chair;with chair alarm set Nurse Communication: Mobility status PT Visit Diagnosis: Unsteadiness on feet (R26.81);Other abnormalities of gait and mobility (R26.89);Muscle weakness (generalized) (M62.81);Difficulty in walking, not elsewhere classified (R26.2)     Time: 3662-9476 PT Time Calculation (min) (ACUTE ONLY): 12 min  Charges:  $Gait Training: 8-22 mins                     Jethro Radke B. Migdalia Dk PT, DPT Acute Rehabilitation Services Pager [phone] Office [phone]    [place] 11/05/2020, 12:11 PM

## 2020-11-05 NOTE — Progress Notes (Signed)
Pharmacy Antibiotic Note  [name] is a 80 y.o. female on HD admitted on 10/25/2020 with pneumonia.  Pt was confused and agitated today; hypotensive with HD yesterday (unable to achieve significant volume removal). Pharmacy has been consulted for vancomycin and cefepime dosing.  Per Epic MAR hx, pt has rec'd the following antibiotic doses this admission: Cefepime 2 gm IV on 5/13, 5/16, 5/18 Vancomycin 1750 mg IV X 1 at 0621 on 5/13, vancomycin 1 gm IV X 1 at 0943 on 5/13 (had HD on 5/13 AM), vancomycin 500 mg IV X 1 on 5/14  Vanc and cefepime were restarted on 5/20 for PNA. CXR showed not active dz. Dr. Aileen Fass will eval for LOT. Plan to resume MWF HD schedule today  Plan: Cefepime 2g IV MWF Vancomycin 1750 mg IV X 1, followed by vancomycin 750 mg IV with each HD Monitor WBC, temp, clinical improvement  Height: 5\' 7"  (170.2 cm) Weight: 72.1 kg (158 lb 15.2 oz) IBW/kg (Calculated) : 61.6  Temp (24hrs), Avg:98.1 F (36.7 C), Min:97.9 F (36.6 C), Max:98.4 F (36.9 C)  Recent Labs  Lab 11/01/20 0256 11/02/20 0541 11/03/20 0222 11/04/20 0143 11/05/20 0428  WBC 10.4 12.7* 11.9* 11.9* 12.7*  CREATININE 4.78* 4.05* 6.09* 4.90* 7.43*    Estimated Creatinine Clearance: 5.8 mL/min (A) (by C-G formula based on SCr of 7.43 mg/dL (H)).    Allergies  Allergen Reactions  . Ambien [Zolpidem] Other (See Comments)    Hallucinations   . Reglan [Metoclopramide] Other (See Comments)    "makes me crazy"    Antimicrobials this admission: See above for prior abx doses this admission Cefepime 5/20 >> Vancomycin 5/20 >>  Microbiology results: 5/13 BCx X 2: NG/final 5/14 MRSA PCR: negative 5/12 COVID, flu A, flu B: negative 5/13 HsAg: negative 5/20 COVID: negative 5/20 blood>>ngtd  Onnie Boer, PharmD, [place], AAHIVP, CPP Infectious Disease Pharmacist 11/05/2020 9:04 AM

## 2020-11-05 NOTE — Progress Notes (Signed)
This encounter was created in error - please disregard.

## 2020-11-05 NOTE — TOC Progression Note (Signed)
Transition of Care [facility]) - Progression Note    Patient Details  Name: [name] MRN: [id number] Date of Birth: 05/21/1941  Transition of [facility]) CM/SW Contact  Sharlet Salina Mila Homer, LCSW Phone Number: 11/05/2020, 12:26 PM  Clinical Narrative: Patient will be medically ready for discharge on Tuesday, per MD. Patient's insurance auth ends today and will be initiated again to be effective 5/24. Morey Hummingbird, admissions SW at [facility] contacted and updated regarding 5/24 discharge and patient can come on the 24th per [place]. PT/OT office contacted and patient seen by PT and OT today, so that current therapy notes can be transmitted to [facility]. Patient will discharge to [facility] on 5/24, once insurance auth received.   Expected Discharge Plan: [place] Barriers to Discharge: Barriers Resolved  Expected Discharge Plan and Services Expected Discharge Plan: [place] In-house Referral: Clinical Social Work     Living arrangements for the past 2 months: Single Family Home Expected Discharge Date: 11/02/20                                   Social Determinants of Health (SDOH) Interventions  None needed or requested at this time.  Readmission Risk Interventions Readmission Risk Prevention Plan 01/17/2020  Transportation Screening Complete  PCP or Specialist Appt within 5-7 Days Not Complete  Not Complete comments pending disposition  Home Care Screening Complete  Medication Review (RN CM) Referral to Pharmacy  Some recent data might be hidden

## 2020-11-05 NOTE — Progress Notes (Signed)
Nephrology Progress Note:  Subjective: feels ok today other than itching. About to get up to chair.  last HD on 5/21 with 2 kg UF.  Updated her daughter at bedside - mentally looks much better per her daughter  Review of systems - shortness of breath with exertion sometimes no cp  No n/v    Objective Vital signs in last 24 hours: Vitals:   11/04/20 1701 11/04/20 2021 11/05/20 0537 11/05/20 1028  BP: (!) 135/50 102/84 (!) 147/54 (!) 141/52  Pulse: 72 82 81 82  Resp: 16 17 17 18   Temp: 98 F (36.7 C) 98.4 F (36.9 C) 97.9 F (36.6 C) 98 F (36.7 C)  TempSrc: Oral Oral Oral Oral  SpO2: 95% 97% 97% 98%  Weight:      Height:       Weight change:   Intake/Output Summary (Last 24 hours) at 11/05/2020 1134 Last data filed at 11/05/2020 0600 Gross per 24 hour  Intake 460 ml  Output 0 ml  Net 460 ml    Dialysis Orders:  MWF-[facility] in [place]  4hrs, BFR 400, DFR 500,  EDW 81kg, 2K/ 2.5Ca  Access: RUE AVF Heparin: none Epogen 2000 units IV push 3x qw Hectorol 57mcg IV 3x qw Home meds: Renvela 800mg  3 tabs po tid with meals and 2 with snacks    Assessment/Plan: 1. HCAP: WBC elevated on admission. BC cultures negative. COVID and Flu (-).  Completed antibiotic course but then placed back on antibiotics due to ongoing clinical issues with altered mental status.   2.  ESRD -  On HD MWF-has received extra treatments for volume removal and to optimize mental status.   1. HD per MWF schedule  3.  Hypertension/volume - aggressive ultrafiltration with dialysis has improved blood pressure.  Optimize volume with HD 4.  Anemia of CKD -hemoglobin acceptable at this time 5.  Secondary Hyperparathyroidism -  continue Hectorol and Renvela 6. Hyperkalemia-  resolved with dialysis 7. Delirium: Unclear cause. Improved. Continue management per primary.    Labs: Basic Metabolic Panel: Recent Labs  Lab 11/03/20 0222 11/04/20 0143 11/05/20 0428  NA 133* 132* 133*  K 4.2 4.4 3.9   CL 94* 93* 94*  CO2 25 25 25   GLUCOSE 182* 153* 171*  BUN 29* 25* 47*  CREATININE 6.09* 4.90* 7.43*  CALCIUM 9.2 9.2 9.0  PHOS 3.5 4.1 3.6   Liver Function Tests: Recent Labs  Lab 11/03/20 0222 11/04/20 0143 11/05/20 0428  AST  --  31  --   ALT  --  27  --   ALKPHOS  --  87  --   BILITOT  --  1.6*  --   PROT  --  8.3*  --   ALBUMIN 2.8* 3.0*  3.0* 2.8*    Recent Labs  Lab 11/02/20 1825  AMMONIA 11   CBC: Recent Labs  Lab 11/01/20 0256 11/02/20 0541 11/03/20 0222 11/04/20 0143 11/05/20 0428  WBC 10.4 12.7* 11.9* 11.9* 12.7*  NEUTROABS 7.7 7.8* 6.8 6.7 7.8*  HGB 11.2* 11.5* 11.5* 12.4 11.7*  HCT 33.2* 35.0* 34.6* 37.5 34.2*  MCV 91.5 93.3 92.0 92.6 91.0  PLT 160 165 165 192 177   CBG: Recent Labs  Lab 11/04/20 0645 11/04/20 1236 11/04/20 1653 11/04/20 2115 11/05/20 0656  GLUCAP 126* 231* 215* 253* 202*    Studies/Results: No results found.   Medications: Infusions: . vancomycin      Scheduled Medications: . amoxicillin-clavulanate  1 tablet Oral q1600  . aspirin  162 mg Oral Daily  . dextromethorphan-guaiFENesin  1 tablet Oral BID  . doxercalciferol  2 mcg Intravenous Q M,W,F-HD  . heparin injection (subcutaneous)  5,000 Units Subcutaneous Q8H  . insulin aspart  0-5 Units Subcutaneous QHS  . insulin aspart  0-6 Units Subcutaneous TID WC  . insulin aspart  2 Units Subcutaneous TID WC  . insulin glargine  5 Units Subcutaneous BID  . labetalol  100 mg Oral BID  . multivitamin  1 tablet Oral QHS  . pentoxifylline  400 mg Oral Q breakfast  . polyethylene glycol  17 g Oral BID  . sevelamer carbonate  800 mg Oral TID WC    have reviewed scheduled and prn medications.  Physical Exam:  General elderly female in bed in no acute distress HEENT normocephalic atraumatic extraocular movements intact sclera anicteric Neck supple trachea midline Lungs clear to auscultation bilaterally normal work of breathing at rest  Heart S1S2 no rub Abdomen  soft nontender nondistended Extremities no edema lower extremities Psych normal mood and affect Neuro Alert and oriented and conversant today Access: RUE AVF bruit and thrill   Barbara Desanctis, MD 11/05/2020,11:47 AM

## 2020-11-05 NOTE — Evaluation (Signed)
Clinical/Bedside Swallow Evaluation Patient Details  Name: [name] MRN: [id number] Date of Birth: Oct 18, 1940  Today's Date: 11/05/2020 Time: SLP Start Time (ACUTE ONLY): 1000 SLP Stop Time (ACUTE ONLY): 1008 SLP Time Calculation (min) (ACUTE ONLY): 8 min  Past Medical History:  Past Medical History:  Diagnosis Date  . Anemia   . Cataract   . Chronic kidney disease   . CVA (cerebral infarction)   . Diabetes mellitus    Type 2  . Diabetes mellitus without complication ([place])   . Dialysis patient ([place])   . Dialysis patient ([place])    M, W, F  . Fistula    R arm  . GERD (gastroesophageal reflux disease)   . Hypertension   . Renal disorder   . Shortness of breath   . Stroke [facility])    right side weakness   Past Surgical History:  Past Surgical History:  Procedure Laterality Date  . AMPUTATION Right 01/13/2020   Procedure: AMPUTATION RIGHT FOOT 1ST RAY;  Surgeon: Newt Minion, MD;  Location: [place];  Service: Orthopedics;  Laterality: Right;  . AV FISTULA PLACEMENT Right 09/08/2013   Procedure: CREATION OF RIGHT BRACHIAL CEPHALIC ARTERIOVENOUS FISTULA ;  Surgeon: Mal Misty, MD;  Location: [place];  Service: Vascular;  Laterality: Right;  . BASCILIC VEIN TRANSPOSITION Right 01/26/2014   Procedure: Right Arm BASILIC VEIN TRANSPOSITION;  Surgeon: Mal Misty, MD;  Location: [place];  Service: Vascular;  Laterality: Right;  . CATARACT EXTRACTION W/PHACO  11/20/2011   Procedure: CATARACT EXTRACTION PHACO AND INTRAOCULAR LENS PLACEMENT (IOC);  Surgeon: Tonny Branch, MD;  Location: [facility];  Service: Ophthalmology;  Laterality: Right;  CDE 18.82  . CATARACT EXTRACTION W/PHACO Left 11/18/2012   Procedure: CATARACT EXTRACTION PHACO AND INTRAOCULAR LENS PLACEMENT (IOC);  Surgeon: Tonny Branch, MD;  Location: [facility];  Service: Ophthalmology;  Laterality: Left;  CDE: 18.97  . COLONOSCOPY N/A 02/09/2013   Procedure: COLONOSCOPY;  Surgeon: Rogene Houston, MD;  [facility];  Service:  Endoscopy;  Laterality: N/A;  305-moved to 220 Ann to notify pt  . COLONOSCOPY N/A 07/08/2018   Procedure: COLONOSCOPY;  Surgeon: Rogene Houston, MD;  [facility];  Service: Endoscopy;  Laterality: N/A;  930  . INSERTION OF DIALYSIS CATHETER Right 06/24/2013   Procedure: INSERTION OF DIALYSIS CATHETER: Ultrasound guided;  Surgeon: Serafina Mitchell, MD;  Location: [place];  Service: Vascular;  Laterality: Right;  . LIGATION OF ARTERIOVENOUS  FISTULA Right 01/26/2014   Procedure: LIGATION OF ARTERIOVENOUS  FISTULA;  Surgeon: Mal Misty, MD;  Location: [place];  Service: Vascular;  Laterality: Right;  . 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 LOOP RECORDER IMPLANT N/A 07/21/2013   Procedure: LOOP RECORDER IMPLANT;  Surgeon: Evans Lance, MD;  Location: [facility] CATH LAB;  Service: Cardiovascular;  Laterality: N/A;  . POLYPECTOMY  07/08/2018   Procedure: POLYPECTOMY;  Surgeon: Rogene Houston, MD;  [facility];  Service: Endoscopy;;  Descending colon polyps x 2   . TEE WITHOUT CARDIOVERSION N/A 07/21/2013   Procedure: TRANSESOPHAGEAL ECHOCARDIOGRAM (TEE);  Surgeon: Dorothy Spark, MD;  Location: [facility];  Service: Cardiovascular;  Laterality: N/A;   HPI:  Pt is an 80 year old female admitted with ARF with ypoxia due to volume overload in setting of noncompliance with dialysis as well asl encephalopathy and potential aspiration pna.   Assessment / Plan /  Recommendation Clinical Impression  Pt demonstrates no signs of dysphagia or aspiration. Her daughter at bedside does report that when she was encephalopathic she had difficulty eating and drinking and clearing her secretions. At this time that problem has resolved. Reviewed aspiration precautions with daughter. Will sign off. SLP Visit Diagnosis: Dysphagia, unspecified (R13.10)    Aspiration Risk  Mild aspiration risk    Diet Recommendation Regular;Thin liquid   Liquid  Administration via: Cup;Straw Medication Administration: Whole meds with liquid Supervision: Patient able to self feed Postural Changes: Seated upright at 90 degrees    Other  Recommendations Oral Care Recommendations: Oral care BID   Follow up Recommendations None      Frequency and Duration            Prognosis        Swallow Study   General HPI: Pt is an 80 year old female admitted with ARF with ypoxia due to volume overload in setting of noncompliance with dialysis as well asl encephalopathy and potential aspiration pna. Type of Study: Bedside Swallow Evaluation Previous Swallow Assessment: none Diet Prior to this Study: Regular;Thin liquids Temperature Spikes Noted: No Respiratory Status: Room air History of Recent Intubation: No Behavior/Cognition: Alert;Cooperative;Pleasant mood Oral Cavity Assessment: Within Functional Limits Oral Care Completed by SLP: No Oral Cavity - Dentition: Dentures, top;Dentures, bottom Vision: Functional for self-feeding Self-Feeding Abilities: Able to feed self Patient Positioning: Upright in bed Baseline Vocal Quality: Normal Volitional Cough: Strong Volitional Swallow: Able to elicit    Oral/Motor/Sensory Function Overall Oral Motor/Sensory Function: Within functional limits   Ice Chips     Thin Liquid Thin Liquid: Within functional limits Presentation: Cup;Straw;Self Fed    Nectar Thick Nectar Thick Liquid: Within functional limits   Honey Thick Honey Thick Liquid: Within functional limits   Puree Puree: Within functional limits   Solid     Solid: Within functional limits      Ellice Boultinghouse, Katherene Ponto 11/05/2020,10:16 AM

## 2020-11-05 NOTE — Progress Notes (Signed)
[facility] PROGRESS NOTE    Progress Note  [name]  YQM:[id number] DOB: Oct 25, 1940 DOA: 10/25/2020 PCP: Barbara Beard, MD     Brief Narrative:   [name] is an 80 y.o. female past medical history significant for stage renal disease on hemodialysis, essential hypertension stroke comes in complaining of chest pain and shortness of breath, she apparently missed her last dialysis treatment in the ED was found to be tachycardic with a respiratory rate of 22 satting 90% on 2 L with a blood pressure of 243/104 chest x-ray shows no acute findings.   Assessment/Plan:   Acute respiratory failure with hypoxia (HCC) due to volume overload in the setting of noncompliance with her dialysis/hyperkalemia/hyponatremia: Her home medications were continued nephrology was consulted and further dialysis and fluid management per renal. She still appears significantly fluid overloaded on physical exam elevated JVD and crackles bilaterally. She is -2 L on dialysis.  Probably tolerate a lot more.  New aspiration Pneumonia Apparently on Vanco and cefepime, white blood cell count has improved. Speech evaluation is pending, she is tolerating her diet no events.  Acute metabolic encephalopathy with a leukocytosis: Suspect infectious in nature.  CT of the head showed no acute findings. Started empirically on vancomycin and cefepime and her encephalopathy has resolved. Blood cultures remain negative till date.  No chest pain: Likely due to volume overload versus pneumonia currently chest pain-free.  Hypertensive urgency: Blood pressure is relatively stable continue oral labetalol IV as needed  Uncontrolled diabetes mellitus with hyperglycemia: Blood glucose trending up as she is tolerating her diet continue long-acting insulin plus sliding scale.  Anemia of chronic disease: Further management per renal.  Thrombocytopenia: Now resolved likely due to infectious etiology   DVT  prophylaxis: lovenox Family Communication:none Status is: Inpatient  Remains inpatient appropriate because:Hemodynamically unstable   Dispo: The patient is from: Home              Anticipated d/c is to: Home              Patient currently is not medically stable to d/c.   Difficult to place patient No Code Status:     Code Status Orders  (From admission, onward)         Start     Ordered   10/26/20 0504  Full code  Continuous        10/26/20 0503        Code Status History    Date Active Date Inactive Code Status Order ID Comments User Context   01/05/2020 1904 01/17/2020 2236 Full Code [id number]  Barbara Cordia, MD ED   10/15/2017 0849 10/16/2017 2318 Full Code [id number]  Barbara Hillock, MD ED   05/29/2015 1916 06/03/2015 2048 Full Code [id number]  Barbara Craze, NP ED   08/23/2013 1948 08/30/2013 1711 Full Code [id number]  Barbara Parsons, PA-C Inpatient   08/23/2013 1948 08/23/2013 1948 Full Code [id number]  Barbara Parsons, PA-C Inpatient   08/19/2013 1751 08/23/2013 1948 Full Code [id number]  Barbara Eva, MD Inpatient   07/20/2013 1517 07/22/2013 2017 Full Code [id number]  Barbara Monte, MD Inpatient   06/25/2013 2101 07/04/2013 2029 Full Code [id number]  Barbara Border, MD Inpatient   Advance Care Planning Activity        IV Access:    Peripheral IV   Procedures and diagnostic studies:   No results found.   Medical Consultants:    None.   Subjective:    Barbara Moh  Howard has no new complaints relates he feels back to baseline tolerating her diet. Objective:    Vitals:   11/04/20 0930 11/04/20 1701 11/04/20 2021 11/05/20 0537  BP: (!) 158/60 (!) 135/50 102/84 (!) 147/54  Pulse: 78 72 82 81  Resp: 18 16 17 17   Temp: 98.2 F (36.8 C) 98 F (36.7 C) 98.4 F (36.9 C) 97.9 F (36.6 C)  TempSrc: Oral Oral Oral Oral  SpO2: 100% 95% 97% 97%  Weight:      Height:       SpO2: 97 % O2 Flow Rate (L/min): 2 L/min   Intake/Output Summary (Last 24 hours) at  11/05/2020 0924 Last data filed at 11/05/2020 0600 Gross per 24 hour  Intake 460 ml  Output 0 ml  Net 460 ml   Filed Weights   11/03/20 0705 11/03/20 1040 11/03/20 2037  Weight: 69.7 kg 68.7 kg 72.1 kg    Exam: General exam: In no acute distress. Respiratory system: Good air movement and clear to auscultation. Cardiovascular system: S1 & S2 heard, RRR. No JVD. Gastrointestinal system: Abdomen is nondistended, soft and nontender.  Extremities: No pedal edema. Skin: No rashes, lesions or ulcers Psychiatry: Judgement and insight appear normal. Mood & affect appropriate.  Data Reviewed:    Labs: Basic Metabolic Panel: Recent Labs  Lab 11/01/20 0256 11/02/20 0541 11/03/20 0222 11/04/20 0143 11/05/20 0428  NA 133* 132* 133* 132* 133*  K 3.8 4.1 4.2 4.4 3.9  CL 94* 93* 94* 93* 94*  CO2 29 29 25 25 25   GLUCOSE 224* 198* 182* 153* 171*  BUN 20 16 29* 25* 47*  CREATININE 4.78* 4.05* 6.09* 4.90* 7.43*  CALCIUM 8.6* 8.9 9.2 9.2 9.0  PHOS 2.6 2.7 3.5 4.1 3.6   GFR Estimated Creatinine Clearance: 5.8 mL/min (A) (by C-G formula based on SCr of 7.43 mg/dL (H)). Liver Function Tests: Recent Labs  Lab 11/01/20 0256 11/02/20 0541 11/03/20 0222 11/04/20 0143 11/05/20 0428  AST  --   --   --  31  --   ALT  --   --   --  27  --   ALKPHOS  --   --   --  87  --   BILITOT  --   --   --  1.6*  --   PROT  --   --   --  8.3*  --   ALBUMIN 2.7* 2.8* 2.8* 3.0*  3.0* 2.8*   No results for input(s): LIPASE, AMYLASE in the last 168 hours. Recent Labs  Lab 11/02/20 1825  AMMONIA 11   Coagulation profile No results for input(s): INR, PROTIME in the last 168 hours. COVID-19 Labs  No results for input(s): DDIMER, FERRITIN, LDH, CRP in the last 72 hours.  Lab Results  Component Value Date   SARSCOV2NAA NEGATIVE 11/02/2020   [place] NEGATIVE 10/25/2020   [place] NEGATIVE 01/05/2020    CBC: Recent Labs  Lab 11/01/20 0256 11/02/20 0541 11/03/20 0222 11/04/20 0143  11/05/20 0428  WBC 10.4 12.7* 11.9* 11.9* 12.7*  NEUTROABS 7.7 7.8* 6.8 6.7 7.8*  HGB 11.2* 11.5* 11.5* 12.4 11.7*  HCT 33.2* 35.0* 34.6* 37.5 34.2*  MCV 91.5 93.3 92.0 92.6 91.0  PLT 160 165 165 192 177   Cardiac Enzymes: No results for input(s): CKTOTAL, CKMB, CKMBINDEX, TROPONINI in the last 168 hours. BNP (last 3 results) No results for input(s): PROBNP in the last 8760 hours. CBG: Recent Labs  Lab 11/04/20 0645 11/04/20 1236 11/04/20 1653 11/04/20 2115  11/05/20 0656  GLUCAP 126* 231* 215* 253* 202*   D-Dimer: No results for input(s): DDIMER in the last 72 hours. Hgb A1c: No results for input(s): HGBA1C in the last 72 hours. Lipid Profile: No results for input(s): CHOL, HDL, LDLCALC, TRIG, CHOLHDL, LDLDIRECT in the last 72 hours. Thyroid function studies: No results for input(s): TSH, T4TOTAL, T3FREE, THYROIDAB in the last 72 hours.  Invalid input(s): FREET3 Anemia work up: No results for input(s): VITAMINB12, FOLATE, FERRITIN, TIBC, IRON, RETICCTPCT in the last 72 hours. Sepsis Labs: Recent Labs  Lab 11/02/20 0541 11/03/20 0222 11/04/20 0143 11/05/20 0428  WBC 12.7* 11.9* 11.9* 12.7*   Microbiology Recent Results (from the past 240 hour(s))  MRSA PCR Screening     Status: None   Collection Time: 10/27/20  1:56 PM   Specimen: Nasopharyngeal  Result Value Ref Range Status   MRSA by PCR NEGATIVE NEGATIVE Final    Comment:        The GeneXpert MRSA Assay (FDA approved for NASAL specimens only), is one component of a comprehensive MRSA colonization surveillance program. It is not intended to diagnose MRSA infection nor to guide or monitor treatment for MRSA infections. Performed at [facility], [place] [address] [place], [place] [address]   SARS CORONAVIRUS 2 (TAT 6-24 HRS) Nasopharyngeal Nasopharyngeal Swab     Status: None   Collection Time: 11/02/20  9:50 AM   Specimen: Nasopharyngeal Swab  Result Value Ref Range Status   SARS Coronavirus 2  NEGATIVE NEGATIVE Final    Comment: (NOTE) SARS-CoV-2 target nucleic acids are NOT DETECTED.  The SARS-CoV-2 RNA is generally detectable in upper and lower respiratory specimens during the acute phase of infection. Negative results do not preclude SARS-CoV-2 infection, do not rule out co-infections with other pathogens, and should not be used as the sole basis for treatment or other patient management decisions. Negative results must be combined with clinical observations, patient history, and epidemiological information. The expected result is Negative.  Fact Sheet for Patients: SugarRoll.be  Fact Sheet for Healthcare Providers: https://www.woods-mathews.com/  This test is not yet approved or cleared by the [place] FDA and  has been authorized for detection and/or diagnosis of SARS-CoV-2 by FDA under an Emergency Use Authorization (EUA). This EUA will remain  in effect (meaning this test can be used) for the duration of the COVID-19 declaration under Se ction 564(b)(1) of the Act, 21 U.S.C. section 360bbb-3(b)(1), unless the authorization is terminated or revoked sooner.  Performed at [facility], [place] [address] [place], [place] [address]   Culture, blood (Routine X 2) w Reflex to ID Panel     Status: None (Preliminary result)   Collection Time: 11/02/20  6:15 PM   Specimen: BLOOD  Result Value Ref Range Status   Specimen Description BLOOD LEFT ANTECUBITAL  Final   Special Requests   Final    BOTTLES DRAWN AEROBIC AND ANAEROBIC Blood Culture results may not be optimal due to an inadequate volume of blood received in culture bottles   Culture   Final    NO GROWTH 3 DAYS Performed at [facility], [place] [address] [place], [place] [address]    Report Status PENDING  Incomplete  Culture, blood (Routine X 2) w Reflex to ID Panel     Status: None (Preliminary result)   Collection Time: 11/02/20  6:25 PM   Specimen:  BLOOD LEFT HAND  Result Value Ref Range Status   Specimen Description BLOOD LEFT  HAND  Final   Special Requests   Final    BOTTLES DRAWN AEROBIC AND ANAEROBIC Blood Culture adequate volume   Culture   Final    NO GROWTH 3 DAYS Performed at [facility], [address] [place], [place] [address]    Report Status PENDING  Incomplete     Medications:   . aspirin  162 mg Oral Daily  . dextromethorphan-guaiFENesin  1 tablet Oral BID  . doxercalciferol  2 mcg Intravenous Q M,W,F-HD  . heparin injection (subcutaneous)  5,000 Units Subcutaneous Q8H  . insulin aspart  0-5 Units Subcutaneous QHS  . insulin aspart  0-6 Units Subcutaneous TID WC  . insulin aspart  2 Units Subcutaneous TID WC  . insulin glargine  3 Units Subcutaneous BID  . multivitamin  1 tablet Oral QHS  . pentoxifylline  400 mg Oral Q breakfast  . polyethylene glycol  17 g Oral BID  . sevelamer carbonate  800 mg Oral TID WC   Continuous Infusions: . ceFEPime (MAXIPIME) IV    . vancomycin        LOS: 10 days   Charlynne Cousins  [facility]  11/05/2020, 9:24 AM

## 2020-11-05 NOTE — Progress Notes (Signed)
Occupational Therapy Treatment Patient Details Name: [name] MRN: [id number] DOB: [birth year] Today's Date: 11/05/2020    History of present illness 80 y.o. female, presented to ED 10/25/20 with R sided chest pain for 1.5 weeks and shortness of breath. Chest CT showed acute abnormalities  Admitted for treatment for acute hypoxic respiratrory failure, suspected HCAP, hyperkalemia and volume overload  PMH: ESRD on HD MWF, HTN, T2DM, stroke   OT comments  Pt making steady progress towards OT goals this session. Pt perseverating on being "itchy" but otherwise reports no pain and is agreeable to OT intervention. Pt continues to present with impaired balance, decreased activity tolerance and generalized deconditioning. Pt able to complete ~ 12 ft of functional mobility within pts room with RW and MIN A. Pt stood at sink ~ 3 mins while OTA applied lotion to pts back. Pt on RA during session with SpO2 100% and HR 88 bpm post mobility. Pt would continue to benefit from skilled occupational therapy while admitted and after d/c to address the below listed limitations in order to improve overall functional mobility and facilitate independence with BADL participation. DC plan remains appropriate, will follow acutely per POC.     Follow Up Recommendations  SNF    Equipment Recommendations  3 in 1 bedside commode;Other (comment) (TBD at next venue of care)    Recommendations for Other Services      Precautions / Restrictions Precautions Precautions: Fall Restrictions Weight Bearing Restrictions: No       Mobility Bed Mobility Overal bed mobility: Needs Assistance Bed Mobility: Supine to Sit     Supine to sit: Min guard;HOB elevated     General bed mobility comments: min guard for safety    Transfers Overall transfer level: Needs assistance Equipment used: Rolling walker (2 wheeled) Transfers: Sit to/from Stand Sit to Stand: Min assist;From elevated surface         General  transfer comment: light MIN A to power up from elevated EOB, assist needed to return to sitting as pt sitting too close to bed rail initially    Balance Overall balance assessment: Needs assistance Sitting-balance support: No upper extremity supported;Feet supported Sitting balance-Leahy Scale: Fair Sitting balance - Comments: sitting EOB with no UE support or LOB   Standing balance support: Bilateral upper extremity supported;During functional activity Standing balance-Leahy Scale: Fair Standing balance comment: BUE supported on sink while OTA applied lotion to pts back                           ADL either performed or assessed with clinical judgement   ADL Overall ADL's : Needs assistance/impaired         Upper Body Bathing: Maximal assistance;Standing Upper Body Bathing Details (indicate cue type and reason): to apply lotion to pts back when standing at sink with BUEs supported on sink             Toilet Transfer: Minimal assistance;Ambulation;RW Toilet Transfer Details (indicate cue type and reason): simulated via functional mobility with RW and MIN A for balance and RW mgmt         Functional mobility during ADLs: Minimal assistance;Rolling walker General ADL Comments: pt making good progress able to progress OOB and complete ~12 ft of functional mobility within room with RW, pt oriented to location and month     Vision       Perception     Praxis      Cognition Arousal/Alertness:  Awake/alert Behavior During Therapy: WFL for tasks assessed/performed Overall Cognitive Status: History of cognitive impairments - at baseline Area of Impairment: Orientation;Attention;Following commands;Awareness;Problem solving                 Orientation Level: Disoriented to;Time (able to state month and location but unable to state year and day of week/ date) Current Attention Level: Sustained   Following Commands: Follows one step commands  consistently;Follows multi-step commands with increased time   Awareness: Emergent Problem Solving: Slow processing General Comments: pt with improved cognition in comparision to previous session, more alert and following commands.slightly slow to process but following all commands, perseverating on being itchy        Exercises     Shoulder Instructions       General Comments pt on RA during session with SpO2 100% post ambulation and HR 88 bpm. applied lotion to pts back as pt reported her back was itching    Pertinent Vitals/ Pain       Pain Assessment: Faces Faces Pain Scale: Hurts a little bit Pain Location: general discomfort from itching Pain Descriptors / Indicators: Grimacing;Discomfort Pain Intervention(s): Monitored during session;Other (comment) (applied lotion to pts back)  Home Living                                          Prior Functioning/Environment              Frequency  Min 2X/week        Progress Toward Goals  OT Goals(current goals can now be found in the care plan section)  Progress towards OT goals: Progressing toward goals  Acute Rehab OT Goals Patient Stated Goal: to not itch anymore OT Goal Formulation: With patient Time For Goal Achievement: 11/12/20 Potential to Achieve Goals: Good  Plan Discharge plan remains appropriate;Frequency remains appropriate    Co-evaluation                 AM-PAC OT "6 Clicks" Daily Activity     Outcome Measure   Help from another person eating meals?: None Help from another person taking care of personal grooming?: A Little Help from another person toileting, which includes using toliet, bedpan, or urinal?: A Little Help from another person bathing (including washing, rinsing, drying)?: A Little Help from another person to put on and taking off regular upper body clothing?: None Help from another person to put on and taking off regular lower body clothing?: A Little 6  Click Score: 20    End of Session Equipment Utilized During Treatment: Gait belt;Rolling walker  OT Visit Diagnosis: Other abnormalities of gait and mobility (R26.89);Unsteadiness on feet (R26.81);Muscle weakness (generalized) (M62.81);Other symptoms and signs involving cognitive function   Activity Tolerance Patient tolerated treatment well   Patient Left in bed;Other (comment) (sitting EOB with PT entering)   Nurse Communication Mobility status        Time: 1125-1141 OT Time Calculation (min): 16 min  Charges: OT General Charges $OT Visit: 1 Visit OT Treatments $Self Care/Home Management : 8-22 mins  Harley Alto., COTA/L Acute Rehabilitation Services [phone]    [name] 11/05/2020, 11:55 AM

## 2020-11-06 ENCOUNTER — Inpatient Hospital Stay (HOSPITAL_COMMUNITY): Payer: Medicare Other

## 2020-11-06 DIAGNOSIS — J9601 Acute respiratory failure with hypoxia: Secondary | ICD-10-CM | POA: Diagnosis not present

## 2020-11-06 LAB — CBC WITH DIFFERENTIAL/PLATELET
Abs Immature Granulocytes: 0.2 10*3/uL — ABNORMAL HIGH (ref 0.00–0.07)
Basophils Absolute: 0.1 10*3/uL (ref 0.0–0.1)
Basophils Relative: 0 %
Eosinophils Absolute: 0.2 10*3/uL (ref 0.0–0.5)
Eosinophils Relative: 1 %
HCT: 34.5 % — ABNORMAL LOW (ref 36.0–46.0)
Hemoglobin: 11.7 g/dL — ABNORMAL LOW (ref 12.0–15.0)
Immature Granulocytes: 2 %
Lymphocytes Relative: 13 %
Lymphs Abs: 1.7 10*3/uL (ref 0.7–4.0)
MCH: 31 pg (ref 26.0–34.0)
MCHC: 33.9 g/dL (ref 30.0–36.0)
MCV: 91.3 fL (ref 80.0–100.0)
Monocytes Absolute: 1.9 10*3/uL — ABNORMAL HIGH (ref 0.1–1.0)
Monocytes Relative: 14 %
Neutro Abs: 9.4 10*3/uL — ABNORMAL HIGH (ref 1.7–7.7)
Neutrophils Relative %: 70 %
Platelets: 166 10*3/uL (ref 150–400)
RBC: 3.78 MIL/uL — ABNORMAL LOW (ref 3.87–5.11)
RDW: 14 % (ref 11.5–15.5)
WBC: 13.4 10*3/uL — ABNORMAL HIGH (ref 4.0–10.5)
nRBC: 0 % (ref 0.0–0.2)

## 2020-11-06 LAB — GLUCOSE, CAPILLARY
Glucose-Capillary: 196 mg/dL — ABNORMAL HIGH (ref 70–99)
Glucose-Capillary: 235 mg/dL — ABNORMAL HIGH (ref 70–99)
Glucose-Capillary: 164 mg/dL — ABNORMAL HIGH (ref 70–99)
Glucose-Capillary: 198 mg/dL — ABNORMAL HIGH (ref 70–99)

## 2020-11-06 LAB — RENAL FUNCTION PANEL
Albumin: 3 g/dL — ABNORMAL LOW (ref 3.5–5.0)
Anion gap: 12 (ref 5–15)
BUN: 25 mg/dL — ABNORMAL HIGH (ref 8–23)
CO2: 26 mmol/L (ref 22–32)
Calcium: 9.1 mg/dL (ref 8.9–10.3)
Chloride: 95 mmol/L — ABNORMAL LOW (ref 98–111)
Creatinine, Ser: 5.12 mg/dL — ABNORMAL HIGH (ref 0.44–1.00)
GFR, Estimated: 8 mL/min — ABNORMAL LOW (ref >60)
Glucose, Bld: 229 mg/dL — ABNORMAL HIGH (ref 70–99)
Phosphorus: 2.9 mg/dL (ref 2.5–4.6)
Potassium: 3.9 mmol/L (ref 3.5–5.1)
Sodium: 133 mmol/L — ABNORMAL LOW (ref 135–145)

## 2020-11-06 MED ORDER — AZITHROMYCIN 500 MG PO TABS
500.0000 mg | ORAL_TABLET | Freq: Every day | ORAL | Status: DC
  Administered 2020-11-06 – 2020-11-08 (×3): 500 mg via ORAL
  Filled 2020-11-06 (×3): qty 1

## 2020-11-06 MED ORDER — CHLORHEXIDINE GLUCONATE CLOTH 2 % EX PADS: 6.0000 | MEDICATED_PAD | Freq: Every day | CUTANEOUS | Status: DC

## 2020-11-06 MED ORDER — SORBITOL 70 % SOLN
30.0000 mL | Freq: Once | Status: AC
  Administered 2020-11-06: 30 mL via ORAL
  Filled 2020-11-06: qty 30

## 2020-11-06 MED ORDER — SEVELAMER CARBONATE 800 MG PO TABS
800.0000 mg | ORAL_TABLET | Freq: Two times a day (BID) | ORAL | Status: DC
  Administered 2020-11-06 – 2020-11-08 (×4): 800 mg via ORAL
  Filled 2020-11-06 (×5): qty 1

## 2020-11-06 NOTE — Plan of Care (Signed)
  Problem: Nutritional: Goal: Ability to make healthy dietary choices will improve Outcome: Progressing   Problem: Clinical Measurements: Goal: Complications related to the disease process, condition or treatment will be avoided or minimized Outcome: Progressing

## 2020-11-06 NOTE — TOC Progression Note (Signed)
Transition of Care [facility]) - Progression Note    Patient Details  Name: [name] MRN: [id number] Date of Birth: 08/16/1940  Transition of Care [facility]) CM/SW [place], LCSW Phone Number: 11/06/2020, 9:41 AM  Clinical Narrative:    CSW submitted clinicals to [facility] for updated authorization to [facility], Ref # [id number].   Expected Discharge Plan: Skilled Nursing Facility Barriers to Discharge: Barriers Resolved  Expected Discharge Plan and Services Expected Discharge Plan: [place] In-house Referral: Clinical Social Work     Living arrangements for the past 2 months: Single Family Home Expected Discharge Date: 11/02/20                                     Social Determinants of Health (SDOH) Interventions    Readmission Risk Interventions Readmission Risk Prevention Plan 01/17/2020  Transportation Screening Complete  PCP or Specialist Appt within 5-7 Days Not Complete  Not Complete comments pending disposition  Home Care Screening Complete  Medication Review (RN CM) Referral to Pharmacy  Some recent data might be hidden

## 2020-11-06 NOTE — TOC Progression Note (Addendum)
Transition of Care [facility]) - Progression Note    Patient Details  Name: [name] MRN: [id number] Date of Birth: December 18, 1940  Transition of Care [facility]) CM/SW Contact  Sharlet Salina Mila Homer, LCSW Phone Number: 11/06/2020, 4:31 PM  Clinical Narrative:  CSW learned this morning that patient was still not medically stable for discharge. Call made to Lieutenant Diego with [facility] [phone]) and message left regarding patient not being medically stable for discharge today.   Called later (1:55 pm) and no message left. Called Carrie at 4:29 pm and message left that CSW will f/u with her tomorrow to determine if they can still take patient once she is medically stable.    Checked [employer] and auth request approved: Auth ID [id number]; Request date 5/24; Approval dates: 4/24 - 5/26; Next review date: 11/08/20.    Expected Discharge Plan: Skilled Nursing Facility Barriers to Discharge: Barriers Resolved  Expected Discharge Plan and Services Expected Discharge Plan: [place] In-house Referral: Clinical Social Work     Living arrangements for the past 2 months: Single Family Home Expected Discharge Date: 11/02/20                                     Social Determinants of Health (SDOH) Interventions    Readmission Risk Interventions Readmission Risk Prevention Plan 01/17/2020  Transportation Screening Complete  PCP or Specialist Appt within 5-7 Days Not Complete  Not Complete comments pending disposition  Home Care Screening Complete  Medication Review (RN CM) Referral to Pharmacy  Some recent data might be hidden

## 2020-11-06 NOTE — Care Management Important Message (Signed)
Important Message  Patient Details  Name: [name] MRN: [id number] Date of Birth: Sep 27, 1940   Medicare Important Message Given:  Yes - Important Message mailed due to current National Emergency  Verbal consent obtained due to current National Emergency  Relationship to patient: Self Contact Name: Laurieanne Call Date: 11/06/20  Time: 1342 Phone: [phone] Outcome: Spoke with contact Important Message mailed to: Patient address on file    Delorse Lek 11/06/2020, 1:42 PM

## 2020-11-06 NOTE — Progress Notes (Signed)
[facility] PROGRESS NOTE    Progress Note  [name]  DGL:[id number] DOB: [birth year] DOA: 10/25/2020 PCP: [name], MD     Brief Narrative:   [name] is an 80 y.o. female past medical history significant for stage renal disease on hemodialysis, essential hypertension stroke comes in complaining of chest pain and shortness of breath, she apparently missed her last dialysis treatment in the ED was found to be tachycardic with a respiratory rate of 22 satting 90% on 2 L with a blood pressure of 243/104 chest x-ray shows no acute findings.   Assessment/Plan:   Acute respiratory failure with hypoxia (HCC) due to volume overload in the setting of noncompliance with her dialysis/hyperkalemia/hyponatremia: Her home medications were continued nephrology was consulted and further dialysis and fluid management per renal. She still appears significantly fluid overloaded on physical exam elevated JVD. Patient is tolerating her dialysis.  New aspiration Pneumonia Transition to oral Augmentin and azithromycin. Has remained afebrile will need to complete a 7-day course.  Nausea and vomiting: Has not been able to tolerate anything this morning, will get her KUB. She has mild leukocytosis. She has not had a bowel movement in over 4 days.  P.o. twice daily yesterday with no bowel movements we will give her sorbitol once, see if she can tolerate it.  Acute metabolic encephalopathy with a leukocytosis: Suspect infectious in nature.  CT of the head showed no acute findings. Started empirically on vancomycin and cefepime and her encephalopathy has resolved. Blood cultures remain negative till date.  Atypical chest pain: Likely due to volume overload versus pneumonia currently chest pain-free.  Hypertensive urgency: Blood pressure is relatively stable continue oral labetalol IV as needed  Uncontrolled diabetes mellitus with hyperglycemia: Blood glucose trending up as  she is tolerating her diet continue long-acting insulin plus sliding scale.  Anemia of chronic disease: Further management per renal.  Thrombocytopenia: Now resolved likely due to infectious etiology   DVT prophylaxis: lovenox Family Communication:none Status is: Inpatient  Remains inpatient appropriate because:Hemodynamically unstable   Dispo: The patient is from: Home              Anticipated d/c is to: Home              Patient currently is not medically stable to d/c.   Difficult to place patient No Code Status:     Code Status Orders  (From admission, onward)         Start     Ordered   10/26/20 0504  Full code  Continuous        10/26/20 0503        Code Status History    Date Active Date Inactive Code Status Order ID Comments User Context   01/05/2020 1904 01/17/2020 2236 Full Code [id number]  Lenore Cordia, MD ED   10/15/2017 0849 10/16/2017 2318 Full Code [id number]  Oswald Hillock, MD ED   05/29/2015 1916 06/03/2015 2048 Full Code [id number]  Willia Craze, NP ED   08/23/2013 1948 08/30/2013 1711 Full Code [id number]  Cathlyn Parsons, PA-C Inpatient   08/23/2013 1948 08/23/2013 1948 Full Code [id number]  Cathlyn Parsons, PA-C Inpatient   08/19/2013 1751 08/23/2013 1948 Full Code [id number]  Orson Eva, MD Inpatient   07/20/2013 1517 07/22/2013 2017 Full Code [id number]  Verlee Monte, MD Inpatient   06/25/2013 2101 07/04/2013 2029 Full Code [id number]  Merton Border, MD Inpatient   Advance Care Planning Activity  IV Access:    Peripheral IV   Procedures and diagnostic studies:   No results found.   Medical Consultants:    None.   Subjective:    [name] relates she started vomiting yesterday night. Objective:    Vitals:   11/05/20 1852 11/05/20 2120 11/06/20 0401 11/06/20 0836  BP: (!) 146/47 132/61 (!) 134/45 (!) 118/45  Pulse: 83 91 84 80  Resp: 20 18 18 18   Temp: 97.9 F (36.6 C) 97.8 F (36.6 C) 98.1 F (36.7 C) 98.2 F  (36.8 C)  TempSrc: Oral Oral Oral Oral  SpO2: 100% 97% 94% 97%  Weight:      Height:       SpO2: 97 % O2 Flow Rate (L/min): 2 L/min   Intake/Output Summary (Last 24 hours) at 11/06/2020 1008 Last data filed at 11/06/2020 0846 Gross per 24 hour  Intake 540 ml  Output 763 ml  Net -223 ml   Filed Weights   11/03/20 2037 11/05/20 1445 11/05/20 1818  Weight: 72.1 kg 71.7 kg 70.5 kg    Exam: General exam: In no acute distress. Respiratory system: Good air movement and clear to auscultation. Cardiovascular system: S1 & S2 heard, RRR. No JVD. Gastrointestinal system: Abdomen is nondistended, soft and nontender.  Extremities: No pedal edema. Skin: No rashes, lesions or ulcers Psychiatry: Judgement and insight appear normal. Mood & affect appropriate.  Data Reviewed:    Labs: Basic Metabolic Panel: Recent Labs  Lab 11/02/20 0541 11/03/20 0222 11/04/20 0143 11/05/20 0428 11/06/20 0356  NA 132* 133* 132* 133* 133*  K 4.1 4.2 4.4 3.9 3.9  CL 93* 94* 93* 94* 95*  CO2 29 25 25 25 26   GLUCOSE 198* 182* 153* 171* 229*  BUN 16 29* 25* 47* 25*  CREATININE 4.05* 6.09* 4.90* 7.43* 5.12*  CALCIUM 8.9 9.2 9.2 9.0 9.1  PHOS 2.7 3.5 4.1 3.6 2.9   GFR Estimated Creatinine Clearance: 8.4 mL/min (A) (by C-G formula based on SCr of 5.12 mg/dL (H)). Liver Function Tests: Recent Labs  Lab 11/02/20 0541 11/03/20 0222 11/04/20 0143 11/05/20 0428 11/06/20 0356  AST  --   --  31  --   --   ALT  --   --  27  --   --   ALKPHOS  --   --  87  --   --   BILITOT  --   --  1.6*  --   --   PROT  --   --  8.3*  --   --   ALBUMIN 2.8* 2.8* 3.0*  3.0* 2.8* 3.0*   No results for input(s): LIPASE, AMYLASE in the last 168 hours. Recent Labs  Lab 11/02/20 1825  AMMONIA 11   Coagulation profile No results for input(s): INR, PROTIME in the last 168 hours. COVID-19 Labs  No results for input(s): DDIMER, FERRITIN, LDH, CRP in the last 72 hours.  Lab Results  Component Value Date    SARSCOV2NAA NEGATIVE 11/02/2020   [place] NEGATIVE 10/25/2020   [place] NEGATIVE 01/05/2020    CBC: Recent Labs  Lab 11/02/20 0541 11/03/20 0222 11/04/20 0143 11/05/20 0428 11/06/20 0356  WBC 12.7* 11.9* 11.9* 12.7* 13.4*  NEUTROABS 7.8* 6.8 6.7 7.8* 9.4*  HGB 11.5* 11.5* 12.4 11.7* 11.7*  HCT 35.0* 34.6* 37.5 34.2* 34.5*  MCV 93.3 92.0 92.6 91.0 91.3  PLT 165 165 192 177 166   Cardiac Enzymes: No results for input(s): CKTOTAL, CKMB, CKMBINDEX, TROPONINI in the last 168 hours.  BNP (last 3 results) No results for input(s): PROBNP in the last 8760 hours. CBG: Recent Labs  Lab 11/05/20 0656 11/05/20 1142 11/05/20 1850 11/05/20 2121 11/06/20 0642  GLUCAP 202* 216* 165* 281* 196*   D-Dimer: No results for input(s): DDIMER in the last 72 hours. Hgb A1c: No results for input(s): HGBA1C in the last 72 hours. Lipid Profile: No results for input(s): CHOL, HDL, LDLCALC, TRIG, CHOLHDL, LDLDIRECT in the last 72 hours. Thyroid function studies: No results for input(s): TSH, T4TOTAL, T3FREE, THYROIDAB in the last 72 hours.  Invalid input(s): FREET3 Anemia work up: No results for input(s): VITAMINB12, FOLATE, FERRITIN, TIBC, IRON, RETICCTPCT in the last 72 hours. Sepsis Labs: Recent Labs  Lab 11/03/20 0222 11/04/20 0143 11/05/20 0428 11/06/20 0356  WBC 11.9* 11.9* 12.7* 13.4*   Microbiology Recent Results (from the past 240 hour(s))  MRSA PCR Screening     Status: None   Collection Time: 10/27/20  1:56 PM   Specimen: Nasopharyngeal  Result Value Ref Range Status   MRSA by PCR NEGATIVE NEGATIVE Final    Comment:        The GeneXpert MRSA Assay (FDA approved for NASAL specimens only), is one component of a comprehensive MRSA colonization surveillance program. It is not intended to diagnose MRSA infection nor to guide or monitor treatment for MRSA infections. Performed at [facility], [place] [address] [place], [place] [address]   SARS CORONAVIRUS 2  (TAT 6-24 HRS) Nasopharyngeal Nasopharyngeal Swab     Status: None   Collection Time: 11/02/20  9:50 AM   Specimen: Nasopharyngeal Swab  Result Value Ref Range Status   SARS Coronavirus 2 NEGATIVE NEGATIVE Final    Comment: (NOTE) SARS-CoV-2 target nucleic acids are NOT DETECTED.  The SARS-CoV-2 RNA is generally detectable in upper and lower respiratory specimens during the acute phase of infection. Negative results do not preclude SARS-CoV-2 infection, do not rule out co-infections with other pathogens, and should not be used as the sole basis for treatment or other patient management decisions. Negative results must be combined with clinical observations, patient history, and epidemiological information. The expected result is Negative.  Fact Sheet for Patients: SugarRoll.be  Fact Sheet for Healthcare Providers: https://www.woods-mathews.com/  This test is not yet approved or cleared by the [place] FDA and  has been authorized for detection and/or diagnosis of SARS-CoV-2 by FDA under an Emergency Use Authorization (EUA). This EUA will remain  in effect (meaning this test can be used) for the duration of the COVID-19 declaration under Se ction 564(b)(1) of the Act, 21 U.S.C. section 360bbb-3(b)(1), unless the authorization is terminated or revoked sooner.  Performed at [facility], [place] [address] [place], [place] [address]   Culture, blood (Routine X 2) w Reflex to ID Panel     Status: None (Preliminary result)   Collection Time: 11/02/20  6:15 PM   Specimen: BLOOD  Result Value Ref Range Status   Specimen Description BLOOD LEFT ANTECUBITAL  Final   Special Requests   Final    BOTTLES DRAWN AEROBIC AND ANAEROBIC Blood Culture results may not be optimal due to an inadequate volume of blood received in culture bottles   Culture   Final    NO GROWTH 4 DAYS Performed at [facility], [place] [address] [place],  [place] [address]    Report Status PENDING  Incomplete  Culture, blood (Routine X 2) w Reflex to ID Panel     Status: None (Preliminary  result)   Collection Time: 11/02/20  6:25 PM   Specimen: BLOOD LEFT HAND  Result Value Ref Range Status   Specimen Description BLOOD LEFT HAND  Final   Special Requests   Final    BOTTLES DRAWN AEROBIC AND ANAEROBIC Blood Culture adequate volume   Culture   Final    NO GROWTH 4 DAYS Performed at [facility], [address], [place], [place] [address]    Report Status PENDING  Incomplete     Medications:   . amoxicillin-clavulanate  1 tablet Oral q1600  . aspirin  162 mg Oral Daily  . azithromycin  500 mg Oral Daily  . dextromethorphan-guaiFENesin  1 tablet Oral BID  . doxercalciferol  2 mcg Intravenous Q M,W,F-HD  . heparin injection (subcutaneous)  5,000 Units Subcutaneous Q8H  . insulin aspart  0-5 Units Subcutaneous QHS  . insulin aspart  0-6 Units Subcutaneous TID WC  . insulin aspart  2 Units Subcutaneous TID WC  . insulin glargine  5 Units Subcutaneous BID  . labetalol  100 mg Oral BID  . multivitamin  1 tablet Oral QHS  . pentoxifylline  400 mg Oral Q breakfast  . sevelamer carbonate  800 mg Oral TID WC  . sorbitol  30 mL Oral Once   Continuous Infusions:     LOS: 11 days   Charlynne Cousins  [facility]  11/06/2020, 10:08 AM

## 2020-11-06 NOTE — Progress Notes (Signed)
Nephrology Progress Note:  Subjective:  Last HD on 5/23 with 0.7 kg UF.  N/v as below.  She states that she hasn't had a BM in several days.  States no nausea yesterday but hot on dialysis.  Spoke with RN and she has just had a PRN for constipation.   Review of systems   - has had quite a bit of nausea today and vomiting this am - denies shortness of breath with exertion sometimes - no cp  Objective Vital signs in last 24 hours: Vitals:   11/05/20 1852 11/05/20 2120 11/06/20 0401 11/06/20 0836  BP: (!) 146/47 132/61 (!) 134/45 (!) 118/45  Pulse: 83 91 84 80  Resp: 20 18 18 18   Temp: 97.9 F (36.6 C) 97.8 F (36.6 C) 98.1 F (36.7 C) 98.2 F (36.8 C)  TempSrc: Oral Oral Oral Oral  SpO2: 100% 97% 94% 97%  Weight:      Height:       Weight change:   Intake/Output Summary (Last 24 hours) at 11/06/2020 1155 Last data filed at 11/06/2020 1148 Gross per 24 hour  Intake 780 ml  Output 763 ml  Net 17 ml    Dialysis Orders:  MWF-[facility] in [place]  4hrs, BFR 400, DFR 500,  EDW 81kg, 2K/ 2.5Ca  Access: RUE AVF Heparin: none Epogen 2000 units IV push 3x qw Hectorol 79mcg IV 3x qw Home meds: Renvela 800mg  3 tabs po tid with meals and 2 with snacks    Assessment/Plan: 1. HCAP: WBC elevated on admission. BC cultures negative. COVID and Flu (-).  Completed antibiotic course but then placed back on antibiotics due to ongoing clinical issues with altered mental status.   2.  ESRD -  On HD MWF-has received extra treatments for volume removal and to optimize mental status.   1. HD per MWF schedule  3.  Hypertension/volume - s/p improvement with UF per charting.  Optimize volume with HD - no overt edema or overload on my exam 4.  Anemia of CKD -hemoglobin acceptable at this time; no indication for ESA 5.  Secondary Hyperparathyroidism -  continue Hectorol and Renvela. Phos trending lower here.  Reduced renvela to twice a day with meals instead of TID with meals 6. Hyperkalemia-   resolved with dialysis 7. Delirium: Unclear cause. Improved. Continue management per primary.    Labs: Basic Metabolic Panel: Recent Labs  Lab 11/04/20 0143 11/05/20 0428 11/06/20 0356  NA 132* 133* 133*  K 4.4 3.9 3.9  CL 93* 94* 95*  CO2 25 25 26   GLUCOSE 153* 171* 229*  BUN 25* 47* 25*  CREATININE 4.90* 7.43* 5.12*  CALCIUM 9.2 9.0 9.1  PHOS 4.1 3.6 2.9   Liver Function Tests: Recent Labs  Lab 11/04/20 0143 11/05/20 0428 11/06/20 0356  AST 31  --   --   ALT 27  --   --   ALKPHOS 87  --   --   BILITOT 1.6*  --   --   PROT 8.3*  --   --   ALBUMIN 3.0*  3.0* 2.8* 3.0*    Recent Labs  Lab 11/02/20 1825  AMMONIA 11   CBC: Recent Labs  Lab 11/02/20 0541 11/03/20 0222 11/04/20 0143 11/05/20 0428 11/06/20 0356  WBC 12.7* 11.9* 11.9* 12.7* 13.4*  NEUTROABS 7.8* 6.8 6.7 7.8* 9.4*  HGB 11.5* 11.5* 12.4 11.7* 11.7*  HCT 35.0* 34.6* 37.5 34.2* 34.5*  MCV 93.3 92.0 92.6 91.0 91.3  PLT 165 165 192 177 166  CBG: Recent Labs  Lab 11/05/20 1142 11/05/20 1850 11/05/20 2121 11/06/20 0642 11/06/20 1126  GLUCAP 216* 165* 281* 196* 235*    Studies/Results: No results found.   Medications: Infusions:   Scheduled Medications: . amoxicillin-clavulanate  1 tablet Oral q1600  . aspirin  162 mg Oral Daily  . azithromycin  500 mg Oral Daily  . dextromethorphan-guaiFENesin  1 tablet Oral BID  . doxercalciferol  2 mcg Intravenous Q M,W,F-HD  . heparin injection (subcutaneous)  5,000 Units Subcutaneous Q8H  . insulin aspart  0-5 Units Subcutaneous QHS  . insulin aspart  0-6 Units Subcutaneous TID WC  . insulin aspart  2 Units Subcutaneous TID WC  . insulin glargine  5 Units Subcutaneous BID  . labetalol  100 mg Oral BID  . multivitamin  1 tablet Oral QHS  . pentoxifylline  400 mg Oral Q breakfast  . sevelamer carbonate  800 mg Oral TID WC    have reviewed scheduled and prn medications.  Physical Exam:   General elderly female in bed in no acute  distress HEENT normocephalic atraumatic extraocular movements intact sclera anicteric Neck supple trachea midline Lungs clear to auscultation bilaterally normal work of breathing at rest  Heart S1S2 no rub Abdomen soft nontender nondistended Extremities no edema lower extremities Psych normal mood and affect Neuro Alert and oriented x3 provides hx limited by nausea Access: RUE AVF bruit and thrill   Claudia Desanctis, MD 11/06/2020,12:10 PM

## 2020-11-07 DIAGNOSIS — N186 End stage renal disease: Secondary | ICD-10-CM | POA: Diagnosis not present

## 2020-11-07 DIAGNOSIS — J9601 Acute respiratory failure with hypoxia: Secondary | ICD-10-CM | POA: Diagnosis not present

## 2020-11-07 DIAGNOSIS — R531 Weakness: Secondary | ICD-10-CM

## 2020-11-07 DIAGNOSIS — D72825 Bandemia: Secondary | ICD-10-CM

## 2020-11-07 DIAGNOSIS — R778 Other specified abnormalities of plasma proteins: Secondary | ICD-10-CM | POA: Diagnosis not present

## 2020-11-07 DIAGNOSIS — I161 Hypertensive emergency: Secondary | ICD-10-CM

## 2020-11-07 DIAGNOSIS — R5381 Other malaise: Secondary | ICD-10-CM

## 2020-11-07 DIAGNOSIS — E118 Type 2 diabetes mellitus with unspecified complications: Secondary | ICD-10-CM | POA: Diagnosis not present

## 2020-11-07 LAB — RENAL FUNCTION PANEL
Albumin: 3.1 g/dL — ABNORMAL LOW (ref 3.5–5.0)
Anion gap: 17 — ABNORMAL HIGH (ref 5–15)
BUN: 37 mg/dL — ABNORMAL HIGH (ref 8–23)
CO2: 25 mmol/L (ref 22–32)
Calcium: 9.7 mg/dL (ref 8.9–10.3)
Chloride: 94 mmol/L — ABNORMAL LOW (ref 98–111)
Creatinine, Ser: 7.41 mg/dL — ABNORMAL HIGH (ref 0.44–1.00)
GFR, Estimated: 5 mL/min — ABNORMAL LOW (ref >60)
Glucose, Bld: 217 mg/dL — ABNORMAL HIGH (ref 70–99)
Phosphorus: 3.6 mg/dL (ref 2.5–4.6)
Potassium: 3.9 mmol/L (ref 3.5–5.1)
Sodium: 136 mmol/L (ref 135–145)

## 2020-11-07 LAB — GLUCOSE, CAPILLARY
Glucose-Capillary: 177 mg/dL — ABNORMAL HIGH (ref 70–99)
Glucose-Capillary: 228 mg/dL — ABNORMAL HIGH (ref 70–99)
Glucose-Capillary: 133 mg/dL — ABNORMAL HIGH (ref 70–99)
Glucose-Capillary: 114 mg/dL — ABNORMAL HIGH (ref 70–99)

## 2020-11-07 LAB — CULTURE, BLOOD (ROUTINE X 2)
Culture: NO GROWTH
Culture: NO GROWTH
Special Requests: ADEQUATE

## 2020-11-07 LAB — CBC WITH DIFFERENTIAL/PLATELET
Abs Immature Granulocytes: 0.15 10*3/uL — ABNORMAL HIGH (ref 0.00–0.07)
Basophils Absolute: 0.1 10*3/uL (ref 0.0–0.1)
Basophils Relative: 0 %
Eosinophils Absolute: 0.2 10*3/uL (ref 0.0–0.5)
Eosinophils Relative: 2 %
HCT: 35.6 % — ABNORMAL LOW (ref 36.0–46.0)
Hemoglobin: 12.2 g/dL (ref 12.0–15.0)
Immature Granulocytes: 1 %
Lymphocytes Relative: 14 %
Lymphs Abs: 2.2 10*3/uL (ref 0.7–4.0)
MCH: 31.3 pg (ref 26.0–34.0)
MCHC: 34.3 g/dL (ref 30.0–36.0)
MCV: 91.3 fL (ref 80.0–100.0)
Monocytes Absolute: 2.3 10*3/uL — ABNORMAL HIGH (ref 0.1–1.0)
Monocytes Relative: 15 %
Neutro Abs: 10.7 10*3/uL — ABNORMAL HIGH (ref 1.7–7.7)
Neutrophils Relative %: 68 %
Platelets: 160 10*3/uL (ref 150–400)
RBC: 3.9 MIL/uL (ref 3.87–5.11)
RDW: 14.6 % (ref 11.5–15.5)
WBC: 15.5 10*3/uL — ABNORMAL HIGH (ref 4.0–10.5)
nRBC: 0 % (ref 0.0–0.2)

## 2020-11-07 MED ORDER — INSULIN ASPART 100 UNIT/ML IJ SOLN
3.0000 [IU] | Freq: Three times a day (TID) | INTRAMUSCULAR | Status: DC
  Administered 2020-11-07: 3 [IU] via SUBCUTANEOUS

## 2020-11-07 MED ORDER — DOXERCALCIFEROL 4 MCG/2ML IV SOLN: 1.0000 ug | INTRAVENOUS | Status: DC

## 2020-11-07 MED ORDER — POLYETHYLENE GLYCOL 3350 17 G PO PACK: 17.0000 g | PACK | Freq: Two times a day (BID) | ORAL | Status: DC | PRN

## 2020-11-07 MED ORDER — SENNOSIDES-DOCUSATE SODIUM 8.6-50 MG PO TABS: 1.0000 | ORAL_TABLET | Freq: Two times a day (BID) | ORAL | Status: DC | PRN

## 2020-11-07 MED ORDER — INSULIN GLARGINE 100 UNIT/ML ~~LOC~~ SOLN
6.0000 [IU] | Freq: Two times a day (BID) | SUBCUTANEOUS | Status: DC
  Administered 2020-11-07: 6 [IU] via SUBCUTANEOUS
  Filled 2020-11-07 (×3): qty 0.06

## 2020-11-07 NOTE — Plan of Care (Signed)
  Problem: Clinical Measurements: Goal: Ability to maintain clinical measurements within normal limits will improve Outcome: Progressing   

## 2020-11-07 NOTE — Progress Notes (Signed)
PT Cancellation Note  Patient Details Name: [name] MRN: [id number] DOB: 25-Jul-1940   Cancelled Treatment:    Reason Eval/Treat Not Completed: (P) Patient at procedure or test/unavailable Pt is off floor for HD. PT will follow back tomorrow.  [name] B. Migdalia Dk PT, DPT Acute Rehabilitation Services Pager [phone] Office [phone]    [place] 11/07/2020, 2:30 PM

## 2020-11-07 NOTE — Progress Notes (Signed)
PROGRESS NOTE  [name] EYC:[id number] DOB: 03/11/1941   PCP: [name], MD  Patient is from: Home.  DOA: 10/25/2020 LOS: 12  Chief complaints: Chest pain and shortness of breath  Brief Narrative / Interim history: 80 year old F with PMH of ESRD on HD MWF, CVA, HTN, DM-2 with neuropathy and ESRD, RLS and ACD presenting with chest pain and shortness of breath and admitted for respiratory failure with hypoxia, hypertensive emergency and volume overload in the setting of missed dialysis and possible HCAP.  Has been dialyzed with ultrafiltration with improvement in her symptoms and blood pressure.  Has been on p.o. antibiotics for possible HCAP.  Therapy recommended SNF.   Subjective: Seen and examined earlier this morning.  No major events overnight of this morning.  She had 1 episode of emesis without she attributes to the juice she drank.  She denies nausea or abdominal pain.  She denies diarrhea.  Denies chest pain, shortness of breath or cough.  Daughter at bedside.  Objective: Vitals:   11/06/20 1811 11/06/20 2024 11/07/20 0402 11/07/20 0914  BP: (!) 138/59 (!) 157/62 (!) 130/50 (!) 118/41  Pulse: 77 81 80 78  Resp: 16 17 18 18   Temp: 98.1 F (36.7 C) 97.7 F (36.5 C) (!) 97.5 F (36.4 C) 98.2 F (36.8 C)  TempSrc: Oral Oral Oral   SpO2: 97% 100% 100% 97%  Weight:      Height:        Intake/Output Summary (Last 24 hours) at 11/07/2020 1358 Last data filed at 11/07/2020 0854 Gross per 24 hour  Intake 360 ml  Output 200 ml  Net 160 ml   Filed Weights   11/03/20 2037 11/05/20 1445 11/05/20 1818  Weight: 72.1 kg 71.7 kg 70.5 kg    Examination:  GENERAL: No apparent distress.  Nontoxic. HEENT: MMM.  Vision and hearing grossly intact.  NECK: Supple.  No apparent JVD.  RESP: On RA.  No IWOB.  Fair aeration bilaterally. CVS:  RRR. Heart sounds normal.  Right aVF with good bruits. ABD/GI/GU: BS+. Abd soft, NTND.  MSK/EXT:  Moves extremities. No apparent  deformity. No edema. S/p right first toe amputation SKIN: no apparent skin lesion or wound NEURO: Awake, alert and oriented appropriately.  No apparent focal neuro deficit. PSYCH: Calm. Normal affect.   Procedures:  Hemodialysis  Microbiology summarized: 5/12 and 5/20-COVID-19 and influenza PCR nonreactive. 5/14-MRSA PCR screen negative. 5/13 and 5/20-blood cultures negative   Assessment & Plan: Acute respiratory failure with hypoxia (HCC) due to volume overload in the setting of noncompliance with her dialysis-resolved. -Fluid management by dialysis per nephrology  Aspiration pneumonia?  Has been afebrile but some leukocytosis after initial improvement.  Patient is well-appearing clinically.  No obvious source of infection at this time. -Received IV cefepime from 5/16-5/23>>Augmentin 5/23-5/24. -Discontinue Augmentin-she should have enough coverage -Started on azithromycin on 5/24-continue for atypical coverage  Nausea and vomiting: Had an episode of emesis this morning.  KUB on 5/24 with large stool.  Had 3 bowel movements since then. -Continue as needed MiraLAX and Senokot-S  Acute metabolic encephalopathy: Likely from hypertensive emergency.  Seems to have resolved.  CTh without acute finding. -Treat treatable causes -Reorientation and delirium precautions  Atypical chest pain: Likely from uncontrolled hypertension versus pneumonia.  Resolved  Hypertensive emergency: Markedly elevated BP with pulmonary edema and encephalopathy.  Resolving -Fluid management with dialysis  Uncontrolled DM-2 with hyperglycemia: A1c 9.4%.  Humulin N 7 units twice daily at home No results for input(s):  HGBA1C in the last 72 hours. Recent Labs  Lab 11/06/20 1126 11/06/20 1704 11/06/20 2025 11/07/20 0800 11/07/20 1138  GLUCAP 235* 164* 198* 177* 228*  -Continue SSI-renal -Increase NovoLog from 2 to 3 units with meals -Increase Lantus from 5 to 6 units twice daily -Further adjustment  as appropriate  Anemia of chronic disease: H&H stable Recent Labs    10/29/20 0342 10/30/20 0438 10/31/20 0408 11/01/20 0256 11/02/20 0541 11/03/20 0222 11/04/20 0143 11/05/20 0428 11/06/20 0356 11/07/20 0418  HGB 10.4* 11.0* 10.6* 11.2* 11.5* 11.5* 12.4 11.7* 11.7* 12.2  -Per nephrology   Thrombocytopenia: Resolved Recent Labs  Lab 11/01/20 0256 11/02/20 0541 11/03/20 0222 11/04/20 0143 11/05/20 0428 11/06/20 0356 11/07/20 0418  PLT 160 165 165 192 177 166 160    Leukocytosis/bandemia: Unclear source of this at this time.  No obvious source of infection at this time other than possible atypical pneumonia. -Recheck in the morning  Debility/generalized weakness-in the setting of acute illness -PT/OT recommended SNF on discharge.  Body mass index is 24.34 kg/m.         DVT prophylaxis:  heparin injection 5,000 Units Start: 10/26/20 1400 SCDs Start: 10/26/20 0504  Code Status: Full code Family Communication: Updated patient's daughter at bedside Level of care: Telemetry Medical Status is: Inpatient  Remains inpatient appropriate because:Unsafe d/c plan   Dispo: The patient is from: Home              Anticipated d/c is to: SNF              Patient currently is medically stable to d/c.   Difficult to place patient No       Consultants:  Nephrology   Sch Meds:  Scheduled Meds: . amoxicillin-clavulanate  1 tablet Oral q1600  . aspirin  162 mg Oral Daily  . azithromycin  500 mg Oral Daily  . Chlorhexidine Gluconate Cloth  6 each Topical Q0600  . dextromethorphan-guaiFENesin  1 tablet Oral BID  . doxercalciferol  1 mcg Intravenous Q M,W,F-HD  . heparin injection (subcutaneous)  5,000 Units Subcutaneous Q8H  . insulin aspart  0-5 Units Subcutaneous QHS  . insulin aspart  0-6 Units Subcutaneous TID WC  . insulin aspart  2 Units Subcutaneous TID WC  . insulin glargine  5 Units Subcutaneous BID  . labetalol  100 mg Oral BID  . multivitamin  1  tablet Oral QHS  . pentoxifylline  400 mg Oral Q breakfast  . sevelamer carbonate  800 mg Oral BID WC   Continuous Infusions: PRN Meds:.acetaminophen **OR** acetaminophen, albuterol, benzonatate, camphor-menthol, HYDROcodone-acetaminophen, ipratropium-albuterol, labetalol, ondansetron **OR** ondansetron (ZOFRAN) IV  Antimicrobials: Anti-infectives (From admission, onward)   Start     Dose/Rate Route Frequency Ordered Stop   11/06/20 1100  azithromycin (ZITHROMAX) tablet 500 mg        500 mg Oral Daily 11/06/20 1003     11/05/20 1600  amoxicillin-clavulanate (AUGMENTIN) 500-125 MG per tablet 500 mg        1 tablet Oral Daily-1600 11/05/20 0931     11/05/20 1200  vancomycin (VANCOREADY) IVPB 750 mg/150 mL        750 mg 150 mL/hr over 60 Minutes Intravenous Every M-W-F (Hemodialysis) 11/02/20 1834 11/05/20 1817   11/05/20 1200  ceFEPIme (MAXIPIME) 2 g in sodium chloride 0.9 % 100 mL IVPB  Status:  Discontinued        2 g 200 mL/hr over 30 Minutes Intravenous Every M-W-F (Hemodialysis) 11/05/20 3536 11/05/20 1443  11/05/20 1015  amoxicillin-clavulanate (AUGMENTIN) 875-125 MG per tablet 1 tablet  Status:  Discontinued        1 tablet Oral Every 12 hours 11/05/20 0927 11/05/20 0929   11/03/20 0930  vancomycin (VANCOREADY) IVPB 750 mg/150 mL        750 mg 150 mL/hr over 60 Minutes Intravenous To Hemodialysis 11/03/20 0916 11/03/20 1035   11/02/20 1930  vancomycin (VANCOREADY) IVPB 1750 mg/350 mL        1,750 mg 175 mL/hr over 120 Minutes Intravenous  Once 11/02/20 1834 11/02/20 2124   11/02/20 1845  ceFEPIme (MAXIPIME) 1 g in sodium chloride 0.9 % 100 mL IVPB  Status:  Discontinued        1 g 200 mL/hr over 30 Minutes Intravenous Every 24 hours 11/02/20 1832 11/05/20 0859   10/29/20 2000  ceFEPIme (MAXIPIME) 2 g in sodium chloride 0.9 % 100 mL IVPB        2 g 200 mL/hr over 30 Minutes Intravenous Every M-W-F (2000) 10/26/20 1337 10/31/20 1557   10/29/20 1200  vancomycin (VANCOCIN) IVPB  750 mg/150 ml premix  Status:  Discontinued        750 mg 150 mL/hr over 60 Minutes Intravenous Every M-W-F (Hemodialysis) 10/28/20 0932 10/28/20 1518   10/29/20 1200  vancomycin (VANCOREADY) IVPB 750 mg/150 mL  Status:  Discontinued        750 mg 150 mL/hr over 60 Minutes Intravenous Every M-W-F (Hemodialysis) 10/28/20 1518 10/29/20 1019   10/27/20 2000  vancomycin (VANCOREADY) IVPB 500 mg/100 mL        500 mg 100 mL/hr over 60 Minutes Intravenous  Once 10/27/20 1354 10/27/20 2033   10/26/20 1200  vancomycin (VANCOCIN) IVPB 1000 mg/200 mL premix  Status:  Discontinued        1,000 mg 200 mL/hr over 60 Minutes Intravenous Every M-W-F (Hemodialysis) 10/26/20 0503 10/28/20 0932   10/26/20 1200  ceFEPIme (MAXIPIME) 2 g in sodium chloride 0.9 % 100 mL IVPB  Status:  Discontinued        2 g 200 mL/hr over 30 Minutes Intravenous Every M-W-F (Hemodialysis) 10/26/20 0503 10/26/20 1337   10/26/20 0515  ceFEPIme (MAXIPIME) 2 g in sodium chloride 0.9 % 100 mL IVPB        2 g 200 mL/hr over 30 Minutes Intravenous NOW 10/26/20 0503 10/26/20 0630   10/26/20 0515  vancomycin (VANCOREADY) IVPB 1750 mg/350 mL        1,750 mg 175 mL/hr over 120 Minutes Intravenous NOW 10/26/20 0503 10/26/20 0821       I have personally reviewed the following labs and images: CBC: Recent Labs  Lab 11/03/20 0222 11/04/20 0143 11/05/20 0428 11/06/20 0356 11/07/20 0418  WBC 11.9* 11.9* 12.7* 13.4* 15.5*  NEUTROABS 6.8 6.7 7.8* 9.4* 10.7*  HGB 11.5* 12.4 11.7* 11.7* 12.2  HCT 34.6* 37.5 34.2* 34.5* 35.6*  MCV 92.0 92.6 91.0 91.3 91.3  PLT 165 192 177 166 160   BMP &GFR Recent Labs  Lab 11/03/20 0222 11/04/20 0143 11/05/20 0428 11/06/20 0356 11/07/20 0418  NA 133* 132* 133* 133* 136  K 4.2 4.4 3.9 3.9 3.9  CL 94* 93* 94* 95* 94*  CO2 25 25 25 26 25   GLUCOSE 182* 153* 171* 229* 217*  BUN 29* 25* 47* 25* 37*  CREATININE 6.09* 4.90* 7.43* 5.12* 7.41*  CALCIUM 9.2 9.2 9.0 9.1 9.7  PHOS 3.5 4.1 3.6 2.9  3.6   Estimated Creatinine Clearance: 5.8 mL/min (A) (by C-G formula  based on SCr of 7.41 mg/dL (H)). Liver & Pancreas: Recent Labs  Lab 11/03/20 0222 11/04/20 0143 11/05/20 0428 11/06/20 0356 11/07/20 0418  AST  --  31  --   --   --   ALT  --  27  --   --   --   ALKPHOS  --  87  --   --   --   BILITOT  --  1.6*  --   --   --   PROT  --  8.3*  --   --   --   ALBUMIN 2.8* 3.0*  3.0* 2.8* 3.0* 3.1*   No results for input(s): LIPASE, AMYLASE in the last 168 hours. Recent Labs  Lab 11/02/20 1825  AMMONIA 11   Diabetic: No results for input(s): HGBA1C in the last 72 hours. Recent Labs  Lab 11/06/20 1126 11/06/20 1704 11/06/20 2025 11/07/20 0800 11/07/20 1138  GLUCAP 235* 164* 198* 177* 228*   Cardiac Enzymes: No results for input(s): CKTOTAL, CKMB, CKMBINDEX, TROPONINI in the last 168 hours. No results for input(s): PROBNP in the last 8760 hours. Coagulation Profile: No results for input(s): INR, PROTIME in the last 168 hours. Thyroid Function Tests: No results for input(s): TSH, T4TOTAL, FREET4, T3FREE, THYROIDAB in the last 72 hours. Lipid Profile: No results for input(s): CHOL, HDL, LDLCALC, TRIG, CHOLHDL, LDLDIRECT in the last 72 hours. Anemia Panel: No results for input(s): VITAMINB12, FOLATE, FERRITIN, TIBC, IRON, RETICCTPCT in the last 72 hours. Urine analysis:    Component Value Date/Time   COLORURINE YELLOW 10/15/2017 2244   APPEARANCEUR HAZY (A) 10/15/2017 2244   LABSPEC 1.015 10/15/2017 2244   PHURINE 7.0 10/15/2017 2244   GLUCOSEU >=500 (A) 10/15/2017 2244   HGBUR SMALL (A) 10/15/2017 2244   BILIRUBINUR NEGATIVE 10/15/2017 2244   [place] 10/15/2017 2244   PROTEINUR 100 (A) 10/15/2017 2244   UROBILINOGEN 0.2 11/19/2014 0909   NITRITE NEGATIVE 10/15/2017 2244   LEUKOCYTESUR MODERATE (A) 10/15/2017 2244   Sepsis Labs: Invalid input(s): PROCALCITONIN, [place]  Microbiology: Recent Results (from the past 240 hour(s))  SARS  CORONAVIRUS 2 (TAT 6-24 HRS) Nasopharyngeal Nasopharyngeal Swab     Status: None   Collection Time: 11/02/20  9:50 AM   Specimen: Nasopharyngeal Swab  Result Value Ref Range Status   SARS Coronavirus 2 NEGATIVE NEGATIVE Final    Comment: (NOTE) SARS-CoV-2 target nucleic acids are NOT DETECTED.  The SARS-CoV-2 RNA is generally detectable in upper and lower respiratory specimens during the acute phase of infection. Negative results do not preclude SARS-CoV-2 infection, do not rule out co-infections with other pathogens, and should not be used as the sole basis for treatment or other patient management decisions. Negative results must be combined with clinical observations, patient history, and epidemiological information. The expected result is Negative.  Fact Sheet for Patients: SugarRoll.be  Fact Sheet for Healthcare Providers: https://www.woods-mathews.com/  This test is not yet approved or cleared by the [place] FDA and  has been authorized for detection and/or diagnosis of SARS-CoV-2 by FDA under an Emergency Use Authorization (EUA). This EUA will remain  in effect (meaning this test can be used) for the duration of the COVID-19 declaration under Se ction 564(b)(1) of the Act, 21 U.S.C. section 360bbb-3(b)(1), unless the authorization is terminated or revoked sooner.  Performed at [facility], [place] [address] [place], [place] [address]   Culture, blood (Routine X 2) w Reflex to ID Panel     Status: None  Collection Time: 11/02/20  6:15 PM   Specimen: BLOOD  Result Value Ref Range Status   Specimen Description BLOOD LEFT ANTECUBITAL  Final   Special Requests   Final    BOTTLES DRAWN AEROBIC AND ANAEROBIC Blood Culture results may not be optimal due to an inadequate volume of blood received in culture bottles   Culture   Final    NO GROWTH 5 DAYS Performed at [facility], [place] [address], [place], [place]  [address]    Report Status 11/07/2020 FINAL  Final  Culture, blood (Routine X 2) w Reflex to ID Panel     Status: None   Collection Time: 11/02/20  6:25 PM   Specimen: BLOOD LEFT HAND  Result Value Ref Range Status   Specimen Description BLOOD LEFT HAND  Final   Special Requests   Final    BOTTLES DRAWN AEROBIC AND ANAEROBIC Blood Culture adequate volume   Culture   Final    NO GROWTH 5 DAYS Performed at [facility], [place] [address], [place],  [address]    Report Status 11/07/2020 FINAL  Final    Radiology Studies: No results found.    [name] T. [place]  If 7PM-7AM, please contact night-coverage [email or web address] 11/07/2020, 1:58 PM

## 2020-11-07 NOTE — TOC Progression Note (Signed)
Transition of Care [facility]) - Progression Note    Patient Details  Name: [name] MRN: [id number] Date of Birth: 15-Jan-1941  Transition of Care [facility]) CM/SW Contact  Sharlet Salina Mila Homer, LCSW Phone Number: 11/07/2020, 4:45 PM  Clinical Narrative:  CSW continuing to follow. Call made to [facility] and message left for admissions SW [place] regarding possible readiness for d/c on 5/26. Current insurance authorization expires 5/26.     Expected Discharge Plan: Skilled Nursing Facility Barriers to Discharge: Barriers Resolved  Expected Discharge Plan and Services Expected Discharge Plan: [facility] In-house Referral: Clinical Social Work     Living arrangements for the past 2 months: Single Family Home Expected Discharge Date: 11/02/20                                     Social Determinants of Health (SDOH) Interventions    Readmission Risk Interventions Readmission Risk Prevention Plan 01/17/2020  Transportation Screening Complete  PCP or Specialist Appt within 5-7 Days Not Complete  Not Complete comments pending disposition  Home Care Screening Complete  Medication Review (RN CM) Referral to Pharmacy  Some recent data might be hidden

## 2020-11-07 NOTE — Progress Notes (Signed)
Nephrology Progress Note:  Subjective:  Last HD on 5/23 with 0.7 kg UF.  She feels good today.  Spoke with family at bedside.  She said may be discharged tomorrow.  Review of systems    - states had nausea yesterday and last night.  None today.   - denies shortness of breath with exertion - no cp  Objective Vital signs in last 24 hours: Vitals:   11/06/20 1811 11/06/20 2024 11/07/20 0402 11/07/20 0914  BP: (!) 138/59 (!) 157/62 (!) 130/50 (!) 118/41  Pulse: 77 81 80 78  Resp: 16 17 18 18   Temp: 98.1 F (36.7 C) 97.7 F (36.5 C) (!) 97.5 F (36.4 C) 98.2 F (36.8 C)  TempSrc: Oral Oral Oral   SpO2: 97% 100% 100% 97%  Weight:      Height:       Weight change:   Intake/Output Summary (Last 24 hours) at 11/07/2020 1035 Last data filed at 11/07/2020 0854 Gross per 24 hour  Intake 720 ml  Output 400 ml  Net 320 ml    Dialysis Orders:  MWF-[facility] in [place]  4hrs, BFR 400, DFR 500,  EDW 81kg, 2K/ 2.5Ca  Access: RUE AVF Heparin: none Epogen 2000 units IV push 3x qw Hectorol 73mcg IV 3x qw Home meds: Renvela 800mg  3 tabs po tid with meals and 2 with snacks    Assessment/Plan: 1. HCAP: WBC elevated on admission. BC cultures negative. COVID and Flu (-).  Completed antibiotic course but then placed back on antibiotics due to ongoing clinical issues with altered mental status.   2.  ESRD -  On HD MWF earlier received extra treatments for volume removal and to optimize mental status.   1. HD per MWF schedule - HD today 3.  Hypertension/volume - s/p improvement with UF per charting.   no overt edema or overload on my exam 4.  Anemia of CKD -hemoglobin acceptable at this time; no indication for ESA 5.  Secondary Hyperparathyroidism -  reduced Hectorol and Renvela. As Phos trending lower here.  Reduced renvela to twice a day with meals instead of TID with meals 6. Hyperkalemia-  resolved with dialysis 7. Delirium: Unclear cause. Improved. Continue management per  primary.    Labs: Basic Metabolic Panel: Recent Labs  Lab 11/05/20 0428 11/06/20 0356 11/07/20 0418  NA 133* 133* 136  K 3.9 3.9 3.9  CL 94* 95* 94*  CO2 25 26 25   GLUCOSE 171* 229* 217*  BUN 47* 25* 37*  CREATININE 7.43* 5.12* 7.41*  CALCIUM 9.0 9.1 9.7  PHOS 3.6 2.9 3.6   Liver Function Tests: Recent Labs  Lab 11/04/20 0143 11/05/20 0428 11/06/20 0356 11/07/20 0418  AST 31  --   --   --   ALT 27  --   --   --   ALKPHOS 87  --   --   --   BILITOT 1.6*  --   --   --   PROT 8.3*  --   --   --   ALBUMIN 3.0*  3.0* 2.8* 3.0* 3.1*    Recent Labs  Lab 11/02/20 1825  AMMONIA 11   CBC: Recent Labs  Lab 11/03/20 0222 11/04/20 0143 11/05/20 0428 11/06/20 0356 11/07/20 0418  WBC 11.9* 11.9* 12.7* 13.4* 15.5*  NEUTROABS 6.8 6.7 7.8* 9.4* 10.7*  HGB 11.5* 12.4 11.7* 11.7* 12.2  HCT 34.6* 37.5 34.2* 34.5* 35.6*  MCV 92.0 92.6 91.0 91.3 91.3  PLT 165 192 177 166 160  CBG: Recent Labs  Lab 11/06/20 0642 11/06/20 1126 11/06/20 1704 11/06/20 2025 11/07/20 0800  GLUCAP 196* 235* 164* 198* 177*    Studies/Results: DG Abd 1 View  Result Date: 11/06/2020 CLINICAL DATA:  Nausea and vomiting. EXAM: ABDOMEN - 1 VIEW COMPARISON:  CT 03/23/2020. FINDINGS: Soft tissue structures are unremarkable. Large amount stool noted throughout the colon. No bowel distention or free air. Lumbar spine scoliosis concave left. Pelvic calcifications consistent phleboliths. IMPRESSION: No acute abnormality identified. Large amount of stool noted throughout the colon. No bowel distention or free air. Electronically Signed   By: Marcello Moores  Register   On: 11/06/2020 12:48     Medications: Infusions:   Scheduled Medications: . amoxicillin-clavulanate  1 tablet Oral q1600  . aspirin  162 mg Oral Daily  . azithromycin  500 mg Oral Daily  . Chlorhexidine Gluconate Cloth  6 each Topical Q0600  . dextromethorphan-guaiFENesin  1 tablet Oral BID  . doxercalciferol  2 mcg Intravenous Q  M,W,F-HD  . heparin injection (subcutaneous)  5,000 Units Subcutaneous Q8H  . insulin aspart  0-5 Units Subcutaneous QHS  . insulin aspart  0-6 Units Subcutaneous TID WC  . insulin aspart  2 Units Subcutaneous TID WC  . insulin glargine  5 Units Subcutaneous BID  . labetalol  100 mg Oral BID  . multivitamin  1 tablet Oral QHS  . pentoxifylline  400 mg Oral Q breakfast  . sevelamer carbonate  800 mg Oral BID WC    have reviewed scheduled and prn medications.  Physical Exam: General elderly female in bed in no acute distress HEENT normocephalic atraumatic extraocular movements intact sclera anicteric Neck supple trachea midline Lungs clear to auscultation bilaterally normal work of breathing at rest on room air Heart S1S2 no rub Abdomen soft nontender nondistended Extremities no edema lower extremities Psych normal mood and affect Neuro Alert and oriented x3 provides hx limited by nausea Access: RUE AVF bruit and thrill   Claudia Desanctis, MD 11/07/2020,10:45 AM

## 2020-11-08 ENCOUNTER — Inpatient Hospital Stay
Admission: RE | Admit: 2020-11-08 | Discharge: 2020-11-23 | Disposition: A | Discharge status: Home / Self Care | Payer: Medicare Other | Source: Ambulatory Visit | Attending: Internal Medicine | Admitting: Internal Medicine

## 2020-11-08 DIAGNOSIS — R778 Other specified abnormalities of plasma proteins: Secondary | ICD-10-CM | POA: Diagnosis not present

## 2020-11-08 DIAGNOSIS — E875 Hyperkalemia: Secondary | ICD-10-CM | POA: Diagnosis not present

## 2020-11-08 DIAGNOSIS — E8779 Other fluid overload: Secondary | ICD-10-CM | POA: Diagnosis not present

## 2020-11-08 DIAGNOSIS — G9341 Metabolic encephalopathy: Secondary | ICD-10-CM | POA: Diagnosis not present

## 2020-11-08 DIAGNOSIS — E519 Thiamine deficiency, unspecified: Secondary | ICD-10-CM | POA: Diagnosis not present

## 2020-11-08 DIAGNOSIS — I161 Hypertensive emergency: Secondary | ICD-10-CM | POA: Diagnosis not present

## 2020-11-08 DIAGNOSIS — E1165 Type 2 diabetes mellitus with hyperglycemia: Secondary | ICD-10-CM | POA: Diagnosis not present

## 2020-11-08 DIAGNOSIS — I10 Essential (primary) hypertension: Secondary | ICD-10-CM | POA: Diagnosis not present

## 2020-11-08 DIAGNOSIS — I160 Hypertensive urgency: Secondary | ICD-10-CM | POA: Diagnosis not present

## 2020-11-08 DIAGNOSIS — E8770 Fluid overload, unspecified: Secondary | ICD-10-CM | POA: Diagnosis not present

## 2020-11-08 DIAGNOSIS — E118 Type 2 diabetes mellitus with unspecified complications: Secondary | ICD-10-CM | POA: Diagnosis not present

## 2020-11-08 DIAGNOSIS — Z89411 Acquired absence of right great toe: Secondary | ICD-10-CM | POA: Diagnosis not present

## 2020-11-08 DIAGNOSIS — I1311 Hypertensive heart and chronic kidney disease without heart failure, with stage 5 chronic kidney disease, or end stage renal disease: Secondary | ICD-10-CM | POA: Diagnosis not present

## 2020-11-08 DIAGNOSIS — Z9981 Dependence on supplemental oxygen: Secondary | ICD-10-CM | POA: Diagnosis not present

## 2020-11-08 DIAGNOSIS — D631 Anemia in chronic kidney disease: Secondary | ICD-10-CM | POA: Diagnosis not present

## 2020-11-08 DIAGNOSIS — N186 End stage renal disease: Secondary | ICD-10-CM | POA: Diagnosis not present

## 2020-11-08 DIAGNOSIS — I152 Hypertension secondary to endocrine disorders: Secondary | ICD-10-CM | POA: Diagnosis not present

## 2020-11-08 DIAGNOSIS — R0789 Other chest pain: Secondary | ICD-10-CM | POA: Diagnosis not present

## 2020-11-08 DIAGNOSIS — J9601 Acute respiratory failure with hypoxia: Secondary | ICD-10-CM | POA: Diagnosis not present

## 2020-11-08 DIAGNOSIS — D696 Thrombocytopenia, unspecified: Secondary | ICD-10-CM | POA: Diagnosis not present

## 2020-11-08 DIAGNOSIS — R262 Difficulty in walking, not elsewhere classified: Secondary | ICD-10-CM | POA: Diagnosis not present

## 2020-11-08 DIAGNOSIS — E1169 Type 2 diabetes mellitus with other specified complication: Secondary | ICD-10-CM | POA: Diagnosis not present

## 2020-11-08 DIAGNOSIS — R5381 Other malaise: Secondary | ICD-10-CM | POA: Diagnosis not present

## 2020-11-08 DIAGNOSIS — E1143 Type 2 diabetes mellitus with diabetic autonomic (poly)neuropathy: Secondary | ICD-10-CM | POA: Diagnosis not present

## 2020-11-08 DIAGNOSIS — M6281 Muscle weakness (generalized): Secondary | ICD-10-CM | POA: Diagnosis not present

## 2020-11-08 DIAGNOSIS — R739 Hyperglycemia, unspecified: Secondary | ICD-10-CM | POA: Diagnosis not present

## 2020-11-08 DIAGNOSIS — E785 Hyperlipidemia, unspecified: Secondary | ICD-10-CM | POA: Diagnosis not present

## 2020-11-08 DIAGNOSIS — E1122 Type 2 diabetes mellitus with diabetic chronic kidney disease: Secondary | ICD-10-CM | POA: Diagnosis not present

## 2020-11-08 DIAGNOSIS — R2681 Unsteadiness on feet: Secondary | ICD-10-CM | POA: Diagnosis not present

## 2020-11-08 DIAGNOSIS — I120 Hypertensive chronic kidney disease with stage 5 chronic kidney disease or end stage renal disease: Secondary | ICD-10-CM | POA: Diagnosis not present

## 2020-11-08 DIAGNOSIS — J189 Pneumonia, unspecified organism: Secondary | ICD-10-CM | POA: Diagnosis not present

## 2020-11-08 DIAGNOSIS — E1142 Type 2 diabetes mellitus with diabetic polyneuropathy: Secondary | ICD-10-CM | POA: Diagnosis not present

## 2020-11-08 DIAGNOSIS — E1159 Type 2 diabetes mellitus with other circulatory complications: Secondary | ICD-10-CM | POA: Diagnosis not present

## 2020-11-08 DIAGNOSIS — E1129 Type 2 diabetes mellitus with other diabetic kidney complication: Secondary | ICD-10-CM | POA: Diagnosis not present

## 2020-11-08 DIAGNOSIS — Z743 Need for continuous supervision: Secondary | ICD-10-CM | POA: Diagnosis not present

## 2020-11-08 DIAGNOSIS — Z992 Dependence on renal dialysis: Secondary | ICD-10-CM | POA: Diagnosis not present

## 2020-11-08 DIAGNOSIS — Z7401 Bed confinement status: Secondary | ICD-10-CM | POA: Diagnosis not present

## 2020-11-08 LAB — GLUCOSE, CAPILLARY
Glucose-Capillary: 290 mg/dL — ABNORMAL HIGH (ref 70–99)
Glucose-Capillary: 175 mg/dL — ABNORMAL HIGH (ref 70–99)

## 2020-11-08 LAB — CBC WITH DIFFERENTIAL/PLATELET
Abs Immature Granulocytes: 0.1 10*3/uL — ABNORMAL HIGH (ref 0.00–0.07)
Basophils Absolute: 0.1 10*3/uL (ref 0.0–0.1)
Basophils Relative: 1 %
Eosinophils Absolute: 0.3 10*3/uL (ref 0.0–0.5)
Eosinophils Relative: 3 %
HCT: 34.4 % — ABNORMAL LOW (ref 36.0–46.0)
Hemoglobin: 11.6 g/dL — ABNORMAL LOW (ref 12.0–15.0)
Immature Granulocytes: 1 %
Lymphocytes Relative: 22 %
Lymphs Abs: 2.3 10*3/uL (ref 0.7–4.0)
MCH: 31.1 pg (ref 26.0–34.0)
MCHC: 33.7 g/dL (ref 30.0–36.0)
MCV: 92.2 fL (ref 80.0–100.0)
Monocytes Absolute: 1.7 10*3/uL — ABNORMAL HIGH (ref 0.1–1.0)
Monocytes Relative: 16 %
Neutro Abs: 6.1 10*3/uL (ref 1.7–7.7)
Neutrophils Relative %: 57 %
Platelets: 155 10*3/uL (ref 150–400)
RBC: 3.73 MIL/uL — ABNORMAL LOW (ref 3.87–5.11)
RDW: 14.9 % (ref 11.5–15.5)
WBC: 10.5 10*3/uL (ref 4.0–10.5)
nRBC: 0 % (ref 0.0–0.2)

## 2020-11-08 LAB — RENAL FUNCTION PANEL
Albumin: 2.9 g/dL — ABNORMAL LOW (ref 3.5–5.0)
Anion gap: 13 (ref 5–15)
BUN: 17 mg/dL (ref 8–23)
CO2: 28 mmol/L (ref 22–32)
Calcium: 9.3 mg/dL (ref 8.9–10.3)
Chloride: 94 mmol/L — ABNORMAL LOW (ref 98–111)
Creatinine, Ser: 5.11 mg/dL — ABNORMAL HIGH (ref 0.44–1.00)
GFR, Estimated: 8 mL/min — ABNORMAL LOW (ref >60)
Glucose, Bld: 193 mg/dL — ABNORMAL HIGH (ref 70–99)
Phosphorus: 3.2 mg/dL (ref 2.5–4.6)
Potassium: 4.2 mmol/L (ref 3.5–5.1)
Sodium: 135 mmol/L (ref 135–145)

## 2020-11-08 LAB — RESP PANEL BY RT-PCR (FLU A&B, COVID) ARPGX2
Influenza A by PCR: NEGATIVE
Influenza B by PCR: NEGATIVE
SARS Coronavirus 2 by RT PCR: NEGATIVE

## 2020-11-08 MED ORDER — DOXERCALCIFEROL 4 MCG/2ML IV SOLN: 1.0000 ug | INTRAVENOUS | Status: DC

## 2020-11-08 MED ORDER — POLYETHYLENE GLYCOL 3350 17 G PO PACK: 17.0000 g | PACK | Freq: Two times a day (BID) | ORAL | 0 refills | Status: DC | PRN

## 2020-11-08 MED ORDER — CHLORHEXIDINE GLUCONATE CLOTH 2 % EX PADS: 6.0000 | MEDICATED_PAD | Freq: Every day | CUTANEOUS | Status: DC

## 2020-11-08 MED ORDER — INSULIN GLARGINE 100 UNIT/ML ~~LOC~~ SOLN
7.0000 [IU] | Freq: Two times a day (BID) | SUBCUTANEOUS | Status: DC
  Administered 2020-11-08: 7 [IU] via SUBCUTANEOUS
  Filled 2020-11-08 (×2): qty 0.07

## 2020-11-08 MED ORDER — INSULIN ASPART 100 UNIT/ML IJ SOLN: 0.0000 [IU] | Freq: Three times a day (TID) | INTRAMUSCULAR | 11 refills | Status: DC

## 2020-11-08 MED ORDER — NEPRO/CARBSTEADY PO LIQD
237.0000 mL | Freq: Two times a day (BID) | ORAL | 0 refills | Status: DC
Start: 2020-11-08 — End: 2020-11-09

## 2020-11-08 MED ORDER — SEVELAMER CARBONATE 800 MG PO TABS: 800.0000 mg | ORAL_TABLET | Freq: Two times a day (BID) | ORAL | 11 refills | Status: DC

## 2020-11-08 MED ORDER — SENNOSIDES-DOCUSATE SODIUM 8.6-50 MG PO TABS: 1.0000 | ORAL_TABLET | Freq: Two times a day (BID) | ORAL | Status: DC | PRN

## 2020-11-08 MED ORDER — INSULIN ASPART 100 UNIT/ML IJ SOLN
4.0000 [IU] | Freq: Three times a day (TID) | INTRAMUSCULAR | Status: DC
  Administered 2020-11-08 (×2): 4 [IU] via SUBCUTANEOUS

## 2020-11-08 MED ORDER — HYDROCODONE-ACETAMINOPHEN 5-325 MG PO TABS: 0.5000 | ORAL_TABLET | Freq: Two times a day (BID) | ORAL | 0 refills | Status: DC | PRN

## 2020-11-08 NOTE — Plan of Care (Signed)
?  Problem: Clinical Measurements: ?Goal: Will remain free from infection ?Outcome: Progressing ?  ?

## 2020-11-08 NOTE — Progress Notes (Signed)
Nephrology Progress Note:  Subjective:  Last HD on 5/25 with 0.3 kg UF.  She states that she is to go to a SNF and they state should be leaving this afternoon.  Daughter is at bedside.  She presented from home   Review of systems     - states some nausea - overall better   - denies shortness of breath with exertion - no cp - BM this am  Objective Vital signs in last 24 hours: Vitals:   11/08/20 0509 11/08/20 0600 11/08/20 0622 11/08/20 0856  BP: (!) 119/42  (!) 147/50 (!) 118/44  Pulse: 76  81 79  Resp: 17  15 20   Temp: 98.2 F (36.8 C)  98.1 F (36.7 C) 98 F (36.7 C)  TempSrc:   Oral Oral  SpO2: 98%  98% 92%  Weight:  69.9 kg    Height:       Weight change:   Intake/Output Summary (Last 24 hours) at 11/08/2020 1002 Last data filed at 11/08/2020 0900 Gross per 24 hour  Intake 480 ml  Output 235 ml  Net 245 ml    Dialysis Orders:  MWF-[facility] in [place]  4hrs, BFR 400, DFR 500,  EDW 81kg, 2K/ 2.5Ca  Access: RUE AVF Heparin: none Epogen 2000 units IV push 3x qw Hectorol 24mcg IV 3x qw Home meds: Renvela 800mg  3 tabs po tid with meals and 2 with snacks    Assessment/Plan: 1. HCAP: WBC elevated on admission. BC cultures negative. COVID and Flu (-).  Completed antibiotic course but then placed back on antibiotics due to ongoing clinical issues with altered mental status.   2.  ESRD -  On HD MWF earlier received extra treatments for volume removal and to optimize mental status.   1. HD per MWF schedule 3.  Hypertension/volume - s/p improvement with UF per charting.   no overt edema or overload on my exam 4.  Anemia of CKD -hemoglobin acceptable at this time; no indication for ESA 5.  Secondary Hyperparathyroidism -  have reduced her Hectorol and Renvela. Phos trending lower here.  Reduced renvela to twice a day with meals instead of TID with meals. hectorol 1 mcg with HD. 6. Hyperkalemia-  resolved with dialysis 7. Delirium: Unclear cause. Improved. Continue  management per primary.  dispo per primary team.  Spoke with HD SW to update her.  She is faxing a d/c summary to her outpatient unit     Labs: Basic Metabolic Panel: Recent Labs  Lab 11/06/20 0356 11/07/20 0418 11/08/20 0345  NA 133* 136 135  K 3.9 3.9 4.2  CL 95* 94* 94*  CO2 26 25 28   GLUCOSE 229* 217* 193*  BUN 25* 37* 17  CREATININE 5.12* 7.41* 5.11*  CALCIUM 9.1 9.7 9.3  PHOS 2.9 3.6 3.2   Liver Function Tests: Recent Labs  Lab 11/04/20 0143 11/05/20 0428 11/06/20 0356 11/07/20 0418 11/08/20 0345  AST 31  --   --   --   --   ALT 27  --   --   --   --   ALKPHOS 87  --   --   --   --   BILITOT 1.6*  --   --   --   --   PROT 8.3*  --   --   --   --   ALBUMIN 3.0*  3.0*   < > 3.0* 3.1* 2.9*   < > = values in this interval not displayed.  Recent Labs  Lab 11/02/20 1825  AMMONIA 11   CBC: Recent Labs  Lab 11/04/20 0143 11/05/20 0428 11/06/20 0356 11/07/20 0418 11/08/20 0345  WBC 11.9* 12.7* 13.4* 15.5* 10.5  NEUTROABS 6.7 7.8* 9.4* 10.7* 6.1  HGB 12.4 11.7* 11.7* 12.2 11.6*  HCT 37.5 34.2* 34.5* 35.6* 34.4*  MCV 92.6 91.0 91.3 91.3 92.2  PLT 192 177 166 160 155   CBG: Recent Labs  Lab 11/07/20 0800 11/07/20 1138 11/07/20 1636 11/07/20 1856 11/08/20 0624  GLUCAP 177* 228* 133* 114* 290*    Studies/Results: DG Abd 1 View  Result Date: 11/06/2020 CLINICAL DATA:  Nausea and vomiting. EXAM: ABDOMEN - 1 VIEW COMPARISON:  CT 03/23/2020. FINDINGS: Soft tissue structures are unremarkable. Large amount stool noted throughout the colon. No bowel distention or free air. Lumbar spine scoliosis concave left. Pelvic calcifications consistent phleboliths. IMPRESSION: No acute abnormality identified. Large amount of stool noted throughout the colon. No bowel distention or free air. Electronically Signed   By: Marcello Moores  Register   On: 11/06/2020 12:48     Medications: Infusions:   Scheduled Medications: . aspirin  162 mg Oral Daily  . azithromycin   500 mg Oral Daily  . dextromethorphan-guaiFENesin  1 tablet Oral BID  . doxercalciferol  1 mcg Intravenous Q M,W,F-HD  . heparin injection (subcutaneous)  5,000 Units Subcutaneous Q8H  . insulin aspart  0-5 Units Subcutaneous QHS  . insulin aspart  0-6 Units Subcutaneous TID WC  . insulin aspart  4 Units Subcutaneous TID WC  . insulin glargine  7 Units Subcutaneous BID  . labetalol  100 mg Oral BID  . multivitamin  1 tablet Oral QHS  . pentoxifylline  400 mg Oral Q breakfast  . sevelamer carbonate  800 mg Oral BID WC    have reviewed scheduled and prn medications.  Physical Exam: General elderly female in bed in no acute distress  HEENT normocephalic atraumatic extraocular movements intact sclera anicteric Neck supple trachea midline Lungs clear to auscultation bilaterally normal work of breathing at rest on room air Heart S1S2 no rub Abdomen soft nontender nondistended Extremities no edema lower extremities Psych normal mood and affect Neuro Alert and oriented x3 provides hx limited by nausea Access: RUE AVF  thrill   Claudia Desanctis, MD 11/08/2020,10:02 AM

## 2020-11-08 NOTE — Progress Notes (Signed)
Occupational Therapy Treatment Patient Details Name: [name] MRN: [id number] DOB: [birth year] Today's Date: 11/08/2020    History of present illness 80 y.o. female, presented to ED 10/25/20 with R sided chest pain for 1.5 weeks and shortness of breath. Chest CT showed acute abnormalities  Admitted for treatment for acute hypoxic respiratrory failure, suspected HCAP, hyperkalemia and volume overload  PMH: ESRD on HD MWF, HTN, T2DM, stroke   OT comments  Pt making progress with functional goals. Pt in recliner upon arrival. Session focused on sit - stand to RW, transfers to [facility], toileting tasks, UB dressing, grooming and ADL mobility safety with RW. Pt pleasantly confused and required mod verbal cues for safety and sequencing. OT will continue to follow acutely to maximize level of function and safety  Follow Up Recommendations  SNF    Equipment Recommendations  3 in 1 bedside commode;Other (comment) (TBD at SNF)    Recommendations for Other Services      Precautions / Restrictions Precautions Precautions: Fall       Mobility Bed Mobility               General bed mobility comments: pt in recliner upon arrival    Transfers Overall transfer level: Needs assistance Equipment used: Rolling walker (2 wheeled) Transfers: Sit to/from [facility] Sit to Stand: Min assist;Mod assist Stand pivot transfers: Min assist       General transfer comment: min A for standing EoB for pericare, pt continuing to have BM and sits quickly on EoB, brought BSC and requires min A for standing up and pivoting to L, min A and maximal cuing for hand placement on rails of BSC for power up, pt requires mod A for guiding hips to recliner as she starts to sit without fully backing up to recliner    Balance Overall balance assessment: Needs assistance Sitting-balance support: Feet supported;Feet unsupported;No upper extremity supported;Bilateral upper extremity  supported Sitting balance-Leahy Scale: Fair     Standing balance support: Bilateral upper extremity supported;During functional activity Standing balance-Leahy Scale: Poor                             ADL either performed or assessed with clinical judgement   ADL   Eating/Feeding: Set up;Independent;Sitting   Grooming: Dance movement psychotherapist;Wash/dry hands;Min guard;Sitting   Upper Body Bathing: Sitting;Minimal assistance Upper Body Bathing Details (indicate cue type and reason): simulated seated in recliner     Upper Body Dressing : Min guard;Sitting       Toilet Transfer: Moderate assistance;Minimal assistance;RW;Stand-pivot;BSC;Cueing for safety;Cueing for sequencing   Toileting- Clothing Manipulation and Hygiene: Moderate assistance;Sit to/from stand       Functional mobility during ADLs: Moderate assistance;Minimal assistance;Rolling walker;Cueing for safety;Cueing for sequencing       Vision Baseline Vision/History: Wears glasses Patient Visual Report: No change from baseline     Perception     Praxis      Cognition Arousal/Alertness: Awake/alert Behavior During Therapy: WFL for tasks assessed/performed Overall Cognitive Status: History of cognitive impairments - at baseline Area of Impairment: Orientation;Problem solving                 Orientation Level: Person;Place;Situation     Following Commands: Follows one step commands inconsistently;Follows multi-step commands inconsistently     Problem Solving: Slow processing;Decreased initiation;Difficulty sequencing;Requires verbal cues;Requires tactile cues          Exercises     Shoulder Instructions  General Comments      Pertinent Vitals/ Pain       Pain Assessment: No/denies pain Pain Score: 0-No pain Pain Intervention(s): Monitored during session;Repositioned  Home Living                                          Prior Functioning/Environment               Frequency  Min 2X/week        Progress Toward Goals  OT Goals(current goals can now be found in the care plan section)  Progress towards OT goals: Progressing toward goals  Acute Rehab OT Goals Patient Stated Goal: "tell my daughter to get me out of here"  Plan Discharge plan remains appropriate    Co-evaluation                 AM-PAC OT "6 Clicks" Daily Activity     Outcome Measure   Help from another person eating meals?: None Help from another person taking care of personal grooming?: A Little Help from another person toileting, which includes using toliet, bedpan, or urinal?: A Lot Help from another person bathing (including washing, rinsing, drying)?: A Little Help from another person to put on and taking off regular upper body clothing?: None Help from another person to put on and taking off regular lower body clothing?: A Little 6 Click Score: 19    End of Session Equipment Utilized During Treatment: Gait belt;Rolling walker;Other (comment) (BSC)  OT Visit Diagnosis: Other abnormalities of gait and mobility (R26.89);Unsteadiness on feet (R26.81);Muscle weakness (generalized) (M62.81);Other symptoms and signs involving cognitive function   Activity Tolerance Patient tolerated treatment well   Patient Left in chair;with call bell/phone within reach;with chair alarm set   Nurse Communication          Time: 1791-5056 OT Time Calculation (min): 20 min  Charges: OT General Charges $OT Visit: 1 Visit OT Treatments $Self Care/Home Management : 8-22 mins     Britt Bottom 11/08/2020, 3:15 PM

## 2020-11-08 NOTE — Discharge Summary (Addendum)
Physician Discharge Summary  [name] VHQ:[id number] DOB: Mar 14, 1941 DOA: 10/25/2020  PCP: [name], MD  Admit date: 10/25/2020 Discharge date: 11/08/2020  Admitted From: Home Disposition: SNF  Recommendations for Outpatient Follow-up:  1. Follow ups as below. 2. Please obtain CBC/BMP/Mag at follow up 3. Please follow up on the following pending results: None   Discharge Condition: Stable CODE STATUS: Full code   Contact information for after-discharge care    [facility] .   Service: Skilled Nursing Contact information: [address] [phone]                   Hospital Course: 80 year old F with PMH of ESRD on HD MWF, CVA, HTN, DM-2 with neuropathy and ESRD, RLS and ACD presenting with chest pain and shortness of breath and admitted for respiratory failure with hypoxia, hypertensive emergency and volume overload in the setting of missed dialysis and possible HCAP.  Patient has been dialyzed with ultrafiltration.  Also completed course of antibiotics for possible HCAP.  Has symptoms and blood pressure improved.  Therapy recommended SNF.  See individual problem list below for more on hospital course.  Discharge Diagnoses:  Acute respiratory failure with hypoxia (HCC) due to volume overload in the setting of noncompliance with her dialysis-resolved. -Fluid management by dialysis per nephrology  HCAP/possible aspiration pneumonia -Completed antibiotic course with IV cefepime from 5/16-5/23>>Augmentin 5/23-5/24 and azithromycin 5/24-5/26.   Nausea and vomiting/constipation:  Likely due to constipation as noted on KUB.  Constipation resolved with bowel regimens. -Continue as needed MiraLAX and Senokot-S  Acute metabolic encephalopathy: Likely from hypertensive emergency.  Seems to have resolved.  CTH without acute finding.  She could benefit from evaluation for  cognitive impairment in outpatient setting. -Reorientation and delirium precautions  ESRD/bone mineral disorder -HD MWF -Continue calcitriol and binders as below  Atypicalchest pain: Likely from uncontrolled hypertension versus pneumonia.  Resolved  Hypertensive emergency: Markedly elevated BP with pulmonary edema and encephalopathy.    Resolved.  Uncontrolled DM-2 with hyperglycemia: A1c 9.4%.  Humulin N 7 units twice daily at home Recent Labs  Lab 11/07/20 0800 11/07/20 1138 11/07/20 1636 11/07/20 1856 11/08/20 0624  GLUCAP 177* 228* 133* 114* 290*  -Discharged on Humulin N 7 units BID and SSI-renal as below  Anemia of chronic disease: H&H stable -EPO and iron per nephrology  Thrombocytopenia: Resolved  Leukocytosis/bandemia:  Resolved  Debility/generalized weakness-in the setting of acute illness -Continue PT/OT at SNF    Body mass index is 24.14 kg/m.            Discharge Exam: Vitals:   11/08/20 0622 11/08/20 0856  BP: (!) 147/50 (!) 118/44  Pulse: 81 79  Resp: 15 20  Temp: 98.1 F (36.7 C) 98 F (36.7 C)  SpO2: 98% 92%    GENERAL: No apparent distress.  Nontoxic. HEENT: MMM.  Vision and hearing grossly intact.  NECK: Supple.  No apparent JVD.  RESP: On RA.  No IWOB.  Fair aeration bilaterally. CVS:  RRR. Heart sounds normal.  Right aVF with good bruits. ABD/GI/GU: Bowel sounds present. Soft. Non tender.  MSK/EXT:  Moves extremities.  Significant muscle mass and subcu fat loss. SKIN: no apparent skin lesion or wound NEURO: Awake, alert and oriented fairly.  No apparent focal neuro deficit. PSYCH: Calm. Normal affect.   Discharge Instructions  Discharge Instructions    Diet - low sodium heart healthy   Complete by:  As directed    Increase activity slowly   Complete by: As directed      Allergies as of 11/08/2020      Reactions   Ambien [zolpidem] Other (See Comments)   Hallucinations    Reglan [metoclopramide] Other (See  Comments)   "makes me crazy"      Medication List    STOP taking these medications   cyclobenzaprine 10 MG tablet Commonly known as: FLEXERIL   predniSONE 5 MG (21) Tbpk tablet Commonly known as: STERAPRED UNI-PAK 21 TAB   sucralfate 1 g tablet Commonly known as: Carafate     TAKE these medications   albuterol 108 (90 Base) MCG/ACT inhaler Commonly known as: VENTOLIN HFA Inhale 2 puffs into the lungs every 6 (six) hours as needed for wheezing or shortness of breath.   aspirin 81 MG chewable tablet Chew 2 tablets (162 mg total) by mouth daily.   BD Insulin Syringe U/F 31G X 5/16" 0.3 ML Misc Generic drug: Insulin Syringe-Needle U-100 2 (two) times daily. as directed   benzonatate 100 MG capsule Commonly known as: TESSALON Take 1 capsule (100 mg total) by mouth every 8 (eight) hours. What changed:   when to take this  reasons to take this   cyanocobalamin 1000 MCG/ML injection Commonly known as: (VITAMIN B-12) Inject 1 mL into the muscle every 30 (thirty) days.   doxercalciferol 4 MCG/2ML injection Commonly known as: HECTOROL Inject 0.5 mLs (1 mcg total) into the vein every Monday, Wednesday, and Friday with hemodialysis.   feeding supplement (NEPRO CARB STEADY) Liqd Take 237 mLs by mouth 2 (two) times daily between meals.   HYDROcodone-acetaminophen 5-325 MG tablet Commonly known as: NORCO/VICODIN Take 0.5 tablets by mouth 2 (two) times daily as needed for up to 7 days for moderate pain.   insulin aspart 100 UNIT/ML injection Commonly known as: novoLOG Inject 0-6 Units into the skin 4 (four) times daily -  before meals and at bedtime. CBG 70 - 120: 0 units CBG 121 - 150: 0 units CBG 151 - 200: 1 unit CBG 201-250: 2 units CBG 251-300: 3 units CBG 301-350: 4 units CBG 351-400: 5 units CBG > 400: Give 6 units and call MD   insulin NPH Human 100 UNIT/ML injection Commonly known as: NOVOLIN N Inject 0.07 mLs (7 Units total) into the skin 2 (two) times daily  before a meal.   labetalol 100 MG tablet Commonly known as: NORMODYNE Take 1 tablet (100 mg total) by mouth 2 (two) times daily.   lidocaine-prilocaine cream Commonly known as: EMLA Apply 1 application topically every Monday, Wednesday, and Friday.   multivitamin Tabs tablet Take 1 tablet by mouth at bedtime.   omeprazole 20 MG capsule Commonly known as: PRILOSEC Take 1 capsule (20 mg total) by mouth daily.   pentoxifylline 400 MG CR tablet Commonly known as: TRENTAL Take 1 tablet (400 mg total) by mouth daily with breakfast.   polyethylene glycol 17 g packet Commonly known as: MIRALAX / GLYCOLAX Take 17 g by mouth 2 (two) times daily as needed for mild constipation.   senna-docusate 8.6-50 MG tablet Commonly known as: Senokot-S Take 1 tablet by mouth 2 (two) times daily as needed for moderate constipation.   sevelamer carbonate 800 MG tablet Commonly known as: RENVELA Take 1 tablet (800 mg total) by mouth 2 (two) times daily before lunch and supper. What changed: when to take this       Consultations:  Nephrology  Procedures/Studies:  Hemodialysis  DG Abd 1 View  Result Date: 11/06/2020 CLINICAL DATA:  Nausea and vomiting. EXAM: ABDOMEN - 1 VIEW COMPARISON:  CT 03/23/2020. FINDINGS: Soft tissue structures are unremarkable. Large amount stool noted throughout the colon. No bowel distention or free air. Lumbar spine scoliosis concave left. Pelvic calcifications consistent phleboliths. IMPRESSION: No acute abnormality identified. Large amount of stool noted throughout the colon. No bowel distention or free air. Electronically Signed   By: Marcello Moores  Register   On: 11/06/2020 12:48   CT HEAD WO CONTRAST  Result Date: 11/03/2020 CLINICAL DATA:  80 year old female with delirium. EXAM: CT HEAD WITHOUT CONTRAST TECHNIQUE: Contiguous axial images were obtained from the base of the skull through the vertex without intravenous contrast. COMPARISON:  Head CT 07/19/2019.  Brain  MRI 01/10/2020. FINDINGS: Brain: Stable cerebral volume from last year. No ventriculomegaly. No midline shift, mass effect, or evidence of intracranial mass lesion. No acute intracranial hemorrhage identified. Chronic dystrophic calcification in the bilateral basal ganglia. Confluent bilateral cerebral white matter hypodensity in association with scattered small chronic cortical infarcts (left superior parietal lobe) and a small chronic left cerebellar infarct. Stable gray-white matter differentiation throughout the brain. No cortically based acute infarct identified. Vascular: Extensive Calcified atherosclerosis at the skull base. No suspicious intracranial vascular hyperdensity. Skull: Hyperostosis, normal variant. No acute osseous abnormality identified. Sinuses/Orbits: Chronic sphenoid mucoperiosteal thickening is stable from last year. Other paranasal sinuses and mastoids are clear. Other: No acute orbit or scalp soft tissue finding. IMPRESSION: 1. No acute intracranial abnormality. 2. Stable non contrast CT appearance of chronic ischemic disease since last year. 3. Mild chronic sphenoid sinusitis. Electronically Signed   By: Genevie Ann M.D.   On: 11/03/2020 04:34   DG CHEST PORT 1 VIEW  Result Date: 11/02/2020 CLINICAL DATA:  Shortness of breath. EXAM: PORTABLE CHEST 1 VIEW COMPARISON:  Oct 27, 2020. FINDINGS: Stable cardiomediastinal silhouette. Both lungs are clear. The visualized skeletal structures are unremarkable. IMPRESSION: No active disease. Electronically Signed   By: Marijo Conception M.D.   On: 11/02/2020 20:32   DG Chest Port 1 View  Result Date: 10/27/2020 CLINICAL DATA:  Dyspnea EXAM: PORTABLE CHEST - 1 VIEW COMPARISON:  10/25/2020 FINDINGS: Increasing coarse airspace opacities in the left mid and lower lung. There are some very mild patchy opacities in the right lower lung. Stable cardiomegaly. No effusion.  No pneumothorax. Visualized bones unremarkable. IMPRESSION: 1. Developing asymmetric  scattered airspace opacities, left greater than right. Electronically Signed   By: Lucrezia Europe M.D.   On: 10/27/2020 10:39   DG Chest Portable 1 View  Result Date: 10/25/2020 CLINICAL DATA:  Shortness of breath and increasing right-sided chest pain EXAM: PORTABLE CHEST 1 VIEW COMPARISON:  09/20/2020 FINDINGS: Cardiac shadow is enlarged. Loop recorder is again seen. Lungs are well aerated without focal infiltrate or sizable effusion. No bony abnormality is seen. Stenting is noted in the right arm. IMPRESSION: No acute abnormality noted. Electronically Signed   By: Inez Catalina M.D.   On: 10/25/2020 19:48       The results of significant diagnostics from this hospitalization (including imaging, microbiology, ancillary and laboratory) are listed below for reference.     Microbiology: Recent Results (from the past 240 hour(s))  SARS CORONAVIRUS 2 (TAT 6-24 HRS) Nasopharyngeal Nasopharyngeal Swab     Status: None   Collection Time: 11/02/20  9:50 AM   Specimen: Nasopharyngeal Swab  Result Value Ref Range Status   SARS Coronavirus 2 NEGATIVE NEGATIVE Final    Comment: (  NOTE) SARS-CoV-2 target nucleic acids are NOT DETECTED.  The SARS-CoV-2 RNA is generally detectable in upper and lower respiratory specimens during the acute phase of infection. Negative results do not preclude SARS-CoV-2 infection, do not rule out co-infections with other pathogens, and should not be used as the sole basis for treatment or other patient management decisions. Negative results must be combined with clinical observations, patient history, and epidemiological information. The expected result is Negative.  Fact Sheet for Patients: SugarRoll.be  Fact Sheet for Healthcare Providers: https://www.woods-mathews.com/  This test is not yet approved or cleared by the [place] FDA and  has been authorized for detection and/or diagnosis of SARS-CoV-2 by FDA under an  Emergency Use Authorization (EUA). This EUA will remain  in effect (meaning this test can be used) for the duration of the COVID-19 declaration under Se ction 564(b)(1) of the Act, 21 U.S.C. section 360bbb-3(b)(1), unless the authorization is terminated or revoked sooner.  Performed at [facility], [place] [address] [place], [place] [address]   Culture, blood (Routine X 2) w Reflex to ID Panel     Status: None   Collection Time: 11/02/20  6:15 PM   Specimen: BLOOD  Result Value Ref Range Status   Specimen Description BLOOD LEFT ANTECUBITAL  Final   Special Requests   Final    BOTTLES DRAWN AEROBIC AND ANAEROBIC Blood Culture results may not be optimal due to an inadequate volume of blood received in culture bottles   Culture   Final    NO GROWTH 5 DAYS Performed at [facility], [place] [address] [place], [place] [address]    Report Status 11/07/2020 FINAL  Final  Culture, blood (Routine X 2) w Reflex to ID Panel     Status: None   Collection Time: 11/02/20  6:25 PM   Specimen: BLOOD LEFT HAND  Result Value Ref Range Status   Specimen Description BLOOD LEFT HAND  Final   Special Requests   Final    BOTTLES DRAWN AEROBIC AND ANAEROBIC Blood Culture adequate volume   Culture   Final    NO GROWTH 5 DAYS Performed at [facility], [place] [address] [place], [place] [address]    Report Status 11/07/2020 FINAL  Final  Resp Panel by RT-PCR (Flu A&B, Covid) Nasopharyngeal Swab     Status: None   Collection Time: 11/08/20  9:40 AM   Specimen: Nasopharyngeal Swab; Nasopharyngeal(NP) swabs in vial transport medium  Result Value Ref Range Status   SARS Coronavirus 2 by RT PCR NEGATIVE NEGATIVE Final    Comment: (NOTE) SARS-CoV-2 target nucleic acids are NOT DETECTED.  The SARS-CoV-2 RNA is generally detectable in upper respiratory specimens during the acute phase of infection. The lowest concentration of SARS-CoV-2 viral copies this assay can detect is 138 copies/mL. A  negative result does not preclude SARS-Cov-2 infection and should not be used as the sole basis for treatment or other patient management decisions. A negative result may occur with  improper specimen collection/handling, submission of specimen other than nasopharyngeal swab, presence of viral mutation(s) within the areas targeted by this assay, and inadequate number of viral copies(<138 copies/mL). A negative result must be combined with clinical observations, patient history, and epidemiological information. The expected result is Negative.  Fact Sheet for Patients:  EntrepreneurPulse.com.au  Fact Sheet for Healthcare Providers:  IncredibleEmployment.be  This test is no t yet approved or cleared by the [place] FDA and  has been authorized for detection  and/or diagnosis of SARS-CoV-2 by FDA under an Emergency Use Authorization (EUA). This EUA will remain  in effect (meaning this test can be used) for the duration of the COVID-19 declaration under Section 564(b)(1) of the Act, 21 U.S.C.section 360bbb-3(b)(1), unless the authorization is terminated  or revoked sooner.       Influenza A by PCR NEGATIVE NEGATIVE Final   Influenza B by PCR NEGATIVE NEGATIVE Final    Comment: (NOTE) The Xpert Xpress SARS-CoV-2/FLU/RSV plus assay is intended as an aid in the diagnosis of influenza from Nasopharyngeal swab specimens and should not be used as a sole basis for treatment. Nasal washings and aspirates are unacceptable for Xpert Xpress SARS-CoV-2/FLU/RSV testing.  Fact Sheet for Patients: EntrepreneurPulse.com.au  Fact Sheet for Healthcare Providers: IncredibleEmployment.be  This test is not yet approved or cleared by the [place] FDA and has been authorized for detection and/or diagnosis of SARS-CoV-2 by FDA under an Emergency Use Authorization (EUA). This EUA will remain in effect (meaning this test can  be used) for the duration of the COVID-19 declaration under Section 564(b)(1) of the Act, 21 U.S.C. section 360bbb-3(b)(1), unless the authorization is terminated or revoked.  Performed at [facility], [place] [address], [place], [place] [address]      Labs:  CBC: Recent Labs  Lab 11/04/20 0143 11/05/20 0428 11/06/20 0356 11/07/20 0418 11/08/20 0345  WBC 11.9* 12.7* 13.4* 15.5* 10.5  NEUTROABS 6.7 7.8* 9.4* 10.7* 6.1  HGB 12.4 11.7* 11.7* 12.2 11.6*  HCT 37.5 34.2* 34.5* 35.6* 34.4*  MCV 92.6 91.0 91.3 91.3 92.2  PLT 192 177 166 160 155   BMP &GFR Recent Labs  Lab 11/04/20 0143 11/05/20 0428 11/06/20 0356 11/07/20 0418 11/08/20 0345  NA 132* 133* 133* 136 135  K 4.4 3.9 3.9 3.9 4.2  CL 93* 94* 95* 94* 94*  CO2 25 25 26 25 28   GLUCOSE 153* 171* 229* 217* 193*  BUN 25* 47* 25* 37* 17  CREATININE 4.90* 7.43* 5.12* 7.41* 5.11*  CALCIUM 9.2 9.0 9.1 9.7 9.3  PHOS 4.1 3.6 2.9 3.6 3.2   Estimated Creatinine Clearance: 8.4 mL/min (A) (by C-G formula based on SCr of 5.11 mg/dL (H)). Liver & Pancreas: Recent Labs  Lab 11/04/20 0143 11/05/20 0428 11/06/20 0356 11/07/20 0418 11/08/20 0345  AST 31  --   --   --   --   ALT 27  --   --   --   --   ALKPHOS 87  --   --   --   --   BILITOT 1.6*  --   --   --   --   PROT 8.3*  --   --   --   --   ALBUMIN 3.0*  3.0* 2.8* 3.0* 3.1* 2.9*   No results for input(s): LIPASE, AMYLASE in the last 168 hours. Recent Labs  Lab 11/02/20 1825  AMMONIA 11   Diabetic: No results for input(s): HGBA1C in the last 72 hours. Recent Labs  Lab 11/07/20 0800 11/07/20 1138 11/07/20 1636 11/07/20 1856 11/08/20 0624  GLUCAP 177* 228* 133* 114* 290*   Cardiac Enzymes: No results for input(s): CKTOTAL, CKMB, CKMBINDEX, TROPONINI in the last 168 hours. No results for input(s): PROBNP in the last 8760 hours. Coagulation Profile: No results for input(s): INR, PROTIME in the last 168 hours. Thyroid Function Tests: No results for  input(s): TSH, T4TOTAL, FREET4, T3FREE, THYROIDAB in the last 72 hours. Lipid Profile: No results for input(s): CHOL,  HDL, LDLCALC, TRIG, CHOLHDL, LDLDIRECT in the last 72 hours. Anemia Panel: No results for input(s): VITAMINB12, FOLATE, FERRITIN, TIBC, IRON, RETICCTPCT in the last 72 hours. Urine analysis:    Component Value Date/Time   COLORURINE YELLOW 10/15/2017 2244   APPEARANCEUR HAZY (A) 10/15/2017 2244   LABSPEC 1.015 10/15/2017 2244   PHURINE 7.0 10/15/2017 2244   GLUCOSEU >=500 (A) 10/15/2017 2244   HGBUR SMALL (A) 10/15/2017 2244   BILIRUBINUR NEGATIVE 10/15/2017 2244   [place] 10/15/2017 2244   PROTEINUR 100 (A) 10/15/2017 2244   UROBILINOGEN 0.2 11/19/2014 0909   NITRITE NEGATIVE 10/15/2017 2244   LEUKOCYTESUR MODERATE (A) 10/15/2017 2244   Sepsis Labs: Invalid input(s): PROCALCITONIN, LACTICIDVEN   Time coordinating discharge: 40 minutes  SIGNED:  Mercy Riding, MD  [facility] 11/08/2020, 11:37 AM  If 7PM-7AM, please contact night-coverage [email or web address]

## 2020-11-08 NOTE — Progress Notes (Signed)
DISCHARGE NOTE [facility]F [facility] to be discharged [facility] per MD order. Patient verbalized understanding.  Skin clean, dry and intact without evidence of skin break down, no evidence of skin tears noted. IV catheter discontinued intact. Site without signs and symptoms of complications. Dressing and pressure applied. Pt denies pain at the site currently. No complaints noted.  Patient free of lines, drains, and wounds.   Discharge packet assembled. An After Visit Summary (AVS) was printed and given to the EMS personnel. Patient escorted via stretcher and discharged to [facility] via ambulance. Report called to accepting facility; all questions and concerns addressed.   Cai Anfinson S Mareena Cavan, RN _______________________________________________________________________

## 2020-11-08 NOTE — Progress Notes (Signed)
Physical Therapy Treatment Patient Details Name: [name] MRN: [id number] DOB: 1940-10-10 Today's Date: 11/08/2020    History of Present Illness 80 y.o. female, presented to ED 10/25/20 with R sided chest pain for 1.5 weeks and shortness of breath. Chest CT showed acute abnormalities  Admitted for treatment for acute hypoxic respiratrory failure, suspected HCAP, hyperkalemia and volume overload  PMH: ESRD on HD MWF, HTN, T2DM, stroke    PT Comments    Pt happy to see therapy and eager to get out of bed. Pt unaware that she has had BM. Pt requires min A for coming to EoB. Pt requires min-mod A for sit<>stand for pericare and min A for pivoting to BSC. Pt requires min A for standing from [facility] and modA for ambulation to recliner. Given pt increased assistance need and increased fatigue did not further progress mobility today. D/c plan remains appropriate at this time. Pt hopeful for d/c this afternoon.    Follow Up Recommendations  SNF     Equipment Recommendations  3in1 (PT)    Recommendations for Other Services OT consult     Precautions / Restrictions Precautions Precautions: Fall Restrictions Weight Bearing Restrictions: No    Mobility  Bed Mobility Overal bed mobility: Needs Assistance Bed Mobility: Supine to Sit     Supine to sit: Min assist;HOB elevated     General bed mobility comments: pt incontinent of stool in bed, requires min A for coming to EoB so she can stand for pericare    Transfers Overall transfer level: Needs assistance Equipment used: Rolling walker (2 wheeled) Transfers: Sit to/from [facility] Sit to Stand: Min assist;Mod assist Stand pivot transfers: Min assist       General transfer comment: min A for standing EoB for pericare, pt continuing to have BM and sits quickly on EoB, brought BSC and requires min A for standing up and pivoting to L, min A and maximal cuing for hand placement on rails of BSC for power up, pt  requires mod A for guiding hips to recliner as she starts to sit without fully backing up to recliner  Ambulation/Gait Ambulation/Gait assistance: Mod assist Gait Distance (Feet): 3 Feet Assistive device: Rolling walker (2 wheeled) Gait Pattern/deviations: Shuffle;Trunk flexed;Narrow base of support;Step-through pattern;Decreased step length - right;Decreased step length - left Gait velocity: slowed Gait velocity interpretation: <1.31 ft/sec, indicative of household ambulator General Gait Details: modA for steadying with ambulation to recliner       Balance Overall balance assessment: Needs assistance Sitting-balance support: Feet supported;Feet unsupported;No upper extremity supported;Bilateral upper extremity supported Sitting balance-Leahy Scale: Fair     Standing balance support: Bilateral upper extremity supported Standing balance-Leahy Scale: Poor Standing balance comment: requires UE support to maintain balance                            Cognition Arousal/Alertness: Awake/alert Behavior During Therapy: Flat affect Overall Cognitive Status: History of cognitive impairments - at baseline Area of Impairment: Orientation                 Orientation Level: Person;Place;Situation     Following Commands: Follows one step commands inconsistently;Follows multi-step commands inconsistently   Awareness: Emergent Problem Solving: Slow processing;Decreased initiation;Difficulty sequencing;Requires verbal cues;Requires tactile cues General Comments: pt unaware that she has had a BM, requires extra cuing for transfers to and from [facility], and does not follow PT cues and starts sitting in recliner without fully turning around  General Comments General comments (skin integrity, edema, etc.): VSS on RA, daughter in room      Pertinent Vitals/Pain Pain Assessment: No/denies pain           PT Goals (current goals can now be found in the care plan section)  Acute Rehab PT Goals Patient Stated Goal: go home PT Goal Formulation: With patient/family Time For Goal Achievement: 11/12/20 Potential to Achieve Goals: Good Progress towards PT goals: Not progressing toward goals - comment (limited by increased fatigue/weakness)    Frequency    Min 2X/week      PT Plan Current plan remains appropriate       AM-PAC PT "6 Clicks" Mobility   Outcome Measure  Help needed turning from your back to your side while in a flat bed without using bedrails?: A Little Help needed moving from lying on your back to sitting on the side of a flat bed without using bedrails?: A Little Help needed moving to and from a bed to a chair (including a wheelchair)?: Total Help needed standing up from a chair using your arms (e.g., wheelchair or bedside chair)?: Total Help needed to walk in hospital room?: Total Help needed climbing 3-5 steps with a railing? : Total 6 Click Score: 10    End of Session Equipment Utilized During Treatment: Gait belt Activity Tolerance: Patient tolerated treatment well;Patient limited by fatigue Patient left: with call bell/phone within reach;with family/visitor present;in chair;with chair alarm set Nurse Communication: Mobility status PT Visit Diagnosis: Unsteadiness on feet (R26.81);Other abnormalities of gait and mobility (R26.89);Muscle weakness (generalized) (M62.81);Difficulty in walking, not elsewhere classified (R26.2)     Time: 1000-1017 PT Time Calculation (min) (ACUTE ONLY): 17 min  Charges:  $Therapeutic Activity: 8-22 mins                     Janetta Vandoren B. Migdalia Dk PT, DPT Acute Rehabilitation Services Pager [phone] Office [phone]    [place] 11/08/2020, 10:33 AM

## 2020-11-08 NOTE — Progress Notes (Signed)
Nursing report called to [facility] nurse [place]

## 2020-11-08 NOTE — Progress Notes (Signed)
Renal Navigator faxed Discharge Summary, Nephrology Note and TOC note stating disposition plan to patient's outpatient HD clinic/[facility] to provide continuity of care.   Alphonzo Cruise,  Renal Navigator [phone]

## 2020-11-08 NOTE — TOC Transition Note (Signed)
Transition of Care [facility]) - CM/SW Discharge Note *5/26 - Discharged to [facility] *Phone number for Report: [phone] *Room number 143   Patient Details  Name: [name] MRN: [id number] Date of Birth: 1941-01-11  Transition of Care [facility]) CM/SW Contact:  Sable Feil, LCSW Phone Number: 11/08/2020, 12:20 PM   Clinical Narrative:  Patient medically stable for discharge and going to [facility] in [place]. Discharge clinicals transmitted to facility and COVID test resulted negative. Fami;y contacted and patient will be transported by non-emergency ambulance transport.      Final next level of care: [facility]) Barriers to Discharge: Barriers Resolved   Patient Goals and CMS Choice Patient states their goals for this hospitalization and ongoing recovery are:: Family in agreement with ST reheab before patient returns home CMS Medicare.gov Compare Post Acute Care list provided to:: Patient Represenative (must comment) Choice offered to / list presented to : Adult Children  Discharge Placement PASRR number recieved: 11/01/20            Patient chooses bed at: [facility] Patient to be transferred to facility by: Non-emergency ambulance transport Name of family member notified: Mahira Petras - sister; [phone] Patient and family notified of of transfer: 11/08/20  Discharge Plan and Services In-house Referral: Clinical Social Work                                   Social Determinants of Health ([place]) Interventions     Readmission Risk Interventions Readmission Risk Prevention Plan 01/17/2020  Transportation Screening Complete  PCP or Specialist Appt within 5-7 Days Not Complete  Not Complete comments pending disposition  Home Care Screening Complete  Medication Review (RN CM) Referral to Pharmacy  Some recent data might be hidden

## 2020-11-09 ENCOUNTER — Non-Acute Institutional Stay (SKILLED_NURSING_FACILITY): Payer: Medicare Other | Admitting: Adult Health

## 2020-11-09 ENCOUNTER — Encounter: Payer: Self-pay | Admitting: Adult Health

## 2020-11-09 ENCOUNTER — Other Ambulatory Visit (HOSPITAL_COMMUNITY)
Admission: RE | Admit: 2020-11-09 | Discharge: 2020-11-09 | Disposition: A | Discharge status: Home / Self Care | Payer: Medicare Other | Source: Skilled Nursing Facility | Attending: Adult Health | Admitting: Adult Health

## 2020-11-09 DIAGNOSIS — E1142 Type 2 diabetes mellitus with diabetic polyneuropathy: Secondary | ICD-10-CM | POA: Diagnosis not present

## 2020-11-09 DIAGNOSIS — E1159 Type 2 diabetes mellitus with other circulatory complications: Secondary | ICD-10-CM | POA: Diagnosis not present

## 2020-11-09 DIAGNOSIS — K5909 Other constipation: Secondary | ICD-10-CM

## 2020-11-09 DIAGNOSIS — Z992 Dependence on renal dialysis: Secondary | ICD-10-CM

## 2020-11-09 DIAGNOSIS — E1143 Type 2 diabetes mellitus with diabetic autonomic (poly)neuropathy: Secondary | ICD-10-CM

## 2020-11-09 DIAGNOSIS — N186 End stage renal disease: Secondary | ICD-10-CM

## 2020-11-09 DIAGNOSIS — I10 Essential (primary) hypertension: Secondary | ICD-10-CM | POA: Diagnosis not present

## 2020-11-09 DIAGNOSIS — I152 Hypertension secondary to endocrine disorders: Secondary | ICD-10-CM

## 2020-11-09 DIAGNOSIS — D696 Thrombocytopenia, unspecified: Secondary | ICD-10-CM | POA: Diagnosis not present

## 2020-11-09 DIAGNOSIS — K3184 Gastroparesis: Secondary | ICD-10-CM

## 2020-11-09 DIAGNOSIS — E1169 Type 2 diabetes mellitus with other specified complication: Secondary | ICD-10-CM | POA: Diagnosis not present

## 2020-11-09 DIAGNOSIS — E1122 Type 2 diabetes mellitus with diabetic chronic kidney disease: Secondary | ICD-10-CM | POA: Diagnosis not present

## 2020-11-09 DIAGNOSIS — Z89411 Acquired absence of right great toe: Secondary | ICD-10-CM | POA: Diagnosis not present

## 2020-11-09 DIAGNOSIS — J189 Pneumonia, unspecified organism: Secondary | ICD-10-CM | POA: Diagnosis not present

## 2020-11-09 DIAGNOSIS — E785 Hyperlipidemia, unspecified: Secondary | ICD-10-CM

## 2020-11-09 DIAGNOSIS — J9601 Acute respiratory failure with hypoxia: Secondary | ICD-10-CM | POA: Diagnosis not present

## 2020-11-09 DIAGNOSIS — I120 Hypertensive chronic kidney disease with stage 5 chronic kidney disease or end stage renal disease: Secondary | ICD-10-CM

## 2020-11-09 DIAGNOSIS — D631 Anemia in chronic kidney disease: Secondary | ICD-10-CM | POA: Insufficient documentation

## 2020-11-09 DIAGNOSIS — I1311 Hypertensive heart and chronic kidney disease without heart failure, with stage 5 chronic kidney disease, or end stage renal disease: Secondary | ICD-10-CM

## 2020-11-09 DIAGNOSIS — E538 Deficiency of other specified B group vitamins: Secondary | ICD-10-CM

## 2020-11-09 DIAGNOSIS — E1151 Type 2 diabetes mellitus with diabetic peripheral angiopathy without gangrene: Secondary | ICD-10-CM

## 2020-11-09 LAB — CBC
HCT: 35.2 % — ABNORMAL LOW (ref 36.0–46.0)
Hemoglobin: 11.6 g/dL — ABNORMAL LOW (ref 12.0–15.0)
MCH: 31.3 pg (ref 26.0–34.0)
MCHC: 33 g/dL (ref 30.0–36.0)
MCV: 94.9 fL (ref 80.0–100.0)
Platelets: 152 10*3/uL (ref 150–400)
RBC: 3.71 MIL/uL — ABNORMAL LOW (ref 3.87–5.11)
RDW: 14.9 % (ref 11.5–15.5)
WBC: 10.6 10*3/uL — ABNORMAL HIGH (ref 4.0–10.5)
nRBC: 0 % (ref 0.0–0.2)

## 2020-11-09 LAB — BASIC METABOLIC PANEL
Anion gap: 10 (ref 5–15)
BUN: 11 mg/dL (ref 8–23)
CO2: 29 mmol/L (ref 22–32)
Calcium: 8.5 mg/dL — ABNORMAL LOW (ref 8.9–10.3)
Chloride: 93 mmol/L — ABNORMAL LOW (ref 98–111)
Creatinine, Ser: 3.08 mg/dL — ABNORMAL HIGH (ref 0.44–1.00)
GFR, Estimated: 15 mL/min — ABNORMAL LOW (ref >60)
Glucose, Bld: 163 mg/dL — ABNORMAL HIGH (ref 70–99)
Potassium: 3.8 mmol/L (ref 3.5–5.1)
Sodium: 132 mmol/L — ABNORMAL LOW (ref 135–145)

## 2020-11-09 NOTE — Progress Notes (Signed)
Location:  [place] Room Number: 143-P Place of Service:  SNF (31)   CODE STATUS: Full  Allergies  Allergen Reactions  . Ambien [Zolpidem] Other (See Comments)    Hallucinations   . Reglan [Metoclopramide] Other (See Comments)    "makes me crazy"    Chief Complaint  Patient presents with  . Hospitalization Follow-up    Hospital follow-up.    HPI:  She is a 80 year old woman who has been hospitalized from 10-25-20 through 11-08-20. Her medical history included: diabetes; esrd on hemodialysis; anemia; hypertension. She presented to the ED with chest pain and shortness of breath. She was admitted for acute respiratory failure with hypoxia; hypertensive urgency and volume overload; had missed dialysis sessions and possible pneumonia.  1. Acute respiratory failure with hypoxia due to volume overload in the setting of noncompliance with her dialysis: her fluids were managed by dialysis per nephrology 2. HCAP question aspiration pneumonia: she was started on IV cefepime 5-16 to 5-23; augmentin 5-32 to 5/24; azithromycin 5-24 to 5-26. She has completed her abt therapy.  3. Nausea/vomiting/constipation: due to constipation on KUB. Has resolved with bowel regimen is now on as needed  4. Acute metabolic encephalopathy: is resolving her ct of head is without acute findings; did show chronic ischemic disease.  She is here for short term rehab with her goal to return back home. She lives with family. She denies any uncontrolled pain; denies any coughing or shortness of breath; no changes in her appetite. 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 Anemia   . Cataract   . Chronic kidney disease   . CVA  (cerebral infarction)   . Diabetes mellitus    Type 2  . Diabetes mellitus without complication ([place])   . Dialysis patient ([place])   . Dialysis patient ([place])    M, W, F  . Fistula    R arm  . GERD (gastroesophageal reflux disease)   . Hypertension   . Renal disorder   . Shortness of breath   . Stroke [facility])    right side weakness    Past Surgical History:  Procedure Laterality Date  . 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 AV FISTULA PLACEMENT Right 09/08/2013   Procedure: CREATION OF RIGHT BRACHIAL CEPHALIC ARTERIOVENOUS FISTULA ;  Surgeon: Mal Misty, MD;  Location: [place];  Service: Vascular;  Laterality: Right;  . BASCILIC VEIN TRANSPOSITION Right 01/26/2014   Procedure: Right Arm BASILIC VEIN TRANSPOSITION;  Surgeon: Mal Misty, MD;  Location: [place];  Service: Vascular;  Laterality: Right;  . CATARACT EXTRACTION W/PHACO  11/20/2011   Procedure: CATARACT EXTRACTION PHACO AND INTRAOCULAR LENS PLACEMENT (IOC);  Surgeon: Tonny Branch, MD;  Location: [facility];  Service: Ophthalmology;  Laterality: Right;  CDE 18.82  . CATARACT EXTRACTION W/PHACO Left 11/18/2012   Procedure: CATARACT EXTRACTION PHACO AND INTRAOCULAR LENS PLACEMENT (IOC);  Surgeon: Tonny Branch, MD;  Location: [facility];  Service: Ophthalmology;  Laterality: Left;  CDE: 18.97  . COLONOSCOPY N/A 02/09/2013   Procedure: COLONOSCOPY;  Surgeon: Rogene Houston, MD;  [facility];  Service: Endoscopy;  Laterality: N/A;  305-moved to 220 Ann to notify pt  . COLONOSCOPY N/A 07/08/2018   Procedure: COLONOSCOPY;  Surgeon: Rogene Houston, MD;  [facility];  Service: Endoscopy;  Laterality: N/A;  930  . INSERTION OF DIALYSIS CATHETER Right 06/24/2013   Procedure: INSERTION OF DIALYSIS CATHETER: Ultrasound guided;  Surgeon: Serafina Mitchell, MD;  Location: [place];  Service: Vascular;  Laterality:  Right;  . LIGATION OF ARTERIOVENOUS  FISTULA Right 01/26/2014   Procedure: LIGATION OF ARTERIOVENOUS  FISTULA;  Surgeon: Mal Misty, MD;  Location: [place];  Service: Vascular;  Laterality: Right;  . 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 LOOP RECORDER IMPLANT N/A 07/21/2013   Procedure: LOOP RECORDER IMPLANT;  Surgeon: Evans Lance, MD;  Location: [facility] CATH LAB;  Service: Cardiovascular;  Laterality: N/A;  . POLYPECTOMY  07/08/2018   Procedure: POLYPECTOMY;  Surgeon: Rogene Houston, MD;  [facility];  Service: Endoscopy;;  Descending colon polyps x 2   . TEE WITHOUT CARDIOVERSION N/A 07/21/2013   Procedure: TRANSESOPHAGEAL ECHOCARDIOGRAM (TEE);  Surgeon: Dorothy Spark, MD;  Location: [facility] ENDOSCOPY;  Service: Cardiovascular;  Laterality: N/A;    Social History   Socioeconomic History  . Marital status: Widowed    Spouse name: Not on file  . Number of children: Not on file  . Years of education: Not on file  . Highest education level: Not on file  Occupational History  . Not on file  Tobacco Use  . Smoking status: Never Smoker  . Smokeless tobacco: Never Used  Vaping Use  . Vaping Use: Never used  Substance and Sexual Activity  . Alcohol use: No  . Drug use: No  . Sexual activity: Not on file  Other Topics Concern  . Not on file  Social History Narrative   ** Merged History Encounter **       Social Determinants of Health   Financial Resource Strain: Not on file  Food Insecurity: Not on file  Transportation Needs: Not on file  Physical Activity: Not on file  Stress: Not on file  Social Connections: Not on file  Intimate Partner Violence: Not on file   Family History  Problem Relation Age of Onset  . Cancer Sister   . Cancer Brother   . Anesthesia problems Neg Hx   . Hypotension Neg Hx   . Malignant hyperthermia Neg Hx   . Pseudochol deficiency Neg Hx       VITAL SIGNS BP 129/68   Pulse 72    Temp 97.7 F (36.5 C)   Resp 20   Ht 5\' 7"  (1.702 m)   Wt 153 lb 9.6 oz (69.7 kg)   SpO2 99% Comment: room air  BMI 24.06 kg/m   Outpatient Encounter Medications as of 11/09/2020  Medication Sig  . albuterol (PROVENTIL HFA;VENTOLIN HFA) 108 (90 Base) MCG/ACT inhaler Inhale 2 puffs into the lungs every 6 (six) hours as needed for wheezing or shortness of breath.  Marland Kitchen aspirin 81 MG chewable tablet Chew 2 tablets (162 mg total) by mouth daily.  . benzonatate (TESSALON) 100 MG capsule Take 1 capsule (100 mg total) by mouth every 8 (eight) hours as needed   . cyanocobalamin (,VITAMIN B-12,) 1000 MCG/ML injection Inject 1 mL into the muscle every 30 (thirty) days.  Marland Kitchen HYDROcodone-acetaminophen (NORCO/VICODIN) 5-325 MG tablet Take 0.5 tablets by  mouth 2 (two) times daily as needed for up to 7 days for moderate pain.  Marland Kitchen insulin lispro   5 units with meals for cbg>150   . insulin NPH Human (NOVOLIN N) 100 UNIT/ML injection Inject 0.07 mLs (7 Units total) into the skin 2 (two) times daily before a meal.  . lidocaine-prilocaine (EMLA) cream Apply 1 application topically every Monday, Wednesday, and Friday.   . multivitamin (RENA-VIT) TABS tablet Take 1 tablet by mouth at bedtime.  . NON FORMULARY Diet: NAS, Cons CHO  . omeprazole (PRILOSEC) 20 MG capsule Take 1 capsule (20 mg total) by mouth daily.  . pentoxifylline (TRENTAL) 400 MG CR tablet Take 1 tablet (400 mg total) by mouth daily with breakfast.  . polyethylene glycol (MIRALAX / GLYCOLAX) 17 g packet Take 17 g by mouth 2 (two) times daily as needed for mild constipation.  . senna-docusate (SENOKOT-S) 8.6-50 MG tablet Take 1 tablet by mouth 2 (two) times daily as needed for moderate constipation.  . sevelamer carbonate (RENVELA) 800 MG tablet Take 1 tablet (800 mg total) by mouth 2 (two) times daily before lunch and supper.  . BD INSULIN SYRINGE U/F 31G X 5/16" 0.3 ML MISC 2 (two) times daily. as directed (Patient not taking: Reported on 11/09/2020)   . labetalol (NORMODYNE) 100 MG tablet Take 1 tablet (100 mg total) by mouth 2 (two) times daily.  Marland Kitchen  doxercalciferol (HECTOROL) 4 MCG/2ML injection Inject 0.5 mLs (1 mcg total) into the vein every Monday, Wednesday, and Friday with hemodialysis.   No facility-administered encounter medications on file as of 11/09/2020.     SIGNIFICANT DIAGNOSTIC EXAMS  TODAY  10-25-20: chest x-ray:  Cardiac shadow is enlarged. Loop recorder is again seen. Lungs are well aerated without focal infiltrate or sizable effusion. No bony abnormality is seen. Stenting is noted in the right arm.  11-02-20: chest x-ray:  Stable cardiomediastinal silhouette. Both lungs are clear. The visualized skeletal structures are unremarkable.  11-03-20: ct of head:  1. No acute intracranial abnormality. 2. Stable non contrast CT appearance of chronic ischemic disease since last year. 3. Mild chronic sphenoid sinusitis.  11-06-20: KUB:  No acute abnormality identified. Large amount of stool noted throughout the colon. No bowel distention or free air.  LABS REVIEWED  10-25-20: wbc 18.1; hgb 11.8; hct 34.6; mcv 93.0 plt 130; glucose 610; bun 82; creat  10.55 k +6.2 na++ 127; ca 7.7; GFR 3 10-26-20: wbc 20.6; hgb 10.9; hct 31.7; mcv 92.2 plt 90; glucose 354; bun 85; creat 11.02; k+ 5.6; na++ 129; ca 7.7; GFR 3; ast 42; albumin 3.5 blood culture no growth  10-29-20: wbc 14.6; hgb 10.4; hct 30.3; mcv 88.9 plt 137; glucose 190; bun 53; creat 8.25; k+ 4.3; na++ 133; ca 8.0; GFR 4 phos 3.0 albumin 2.5  10-31-20: wbc 8,5; hgb 10.6; hct 31.4; mcv 91.3 plt 149; glucose 336; bun 37; creat 6.93; k+ 3.9; na++ 131; ca 8.1; GFR 6; phos 3.0; albumin 2.6 hgb a1c 9.4 11-04-20: wbc 11.9; hgb 12.4; hct 37.5; mcv 92.6 plt 192; glucose 153; bun 25; creat 4.90; k+ 4.4; na++ 132; ca 9.2 GFR 8; phos 4.1; total bili 1.6 albumin 3.0 11-09-20: wbc 10.6; hgb 11.6; hct 35.2; mcv 94.9 plt 152; glucose 163; bun 11; creat 3.08; k+ 3.8; na++ 132; ca 8.5 GFR 15    Review of Systems  Constitutional: Negative for malaise/fatigue.  Respiratory: Negative for cough and shortness of breath.   Cardiovascular: Negative for chest pain, palpitations and leg  swelling.  Gastrointestinal: Negative for abdominal pain, constipation and heartburn.  Musculoskeletal: Negative for back pain, joint pain and myalgias.  Skin: Negative.   Neurological: Negative for dizziness.  Psychiatric/Behavioral: The patient is not nervous/anxious.     Physical Exam Constitutional:      General: She is not in acute distress.    Appearance: She is well-developed. She is not diaphoretic.  Neck:     Thyroid: No thyromegaly.  Cardiovascular:     Rate and Rhythm: Normal rate and regular rhythm.     Pulses: Normal pulses.     Heart sounds: Normal heart sounds.  Pulmonary:     Effort: Pulmonary effort is normal. No respiratory distress.     Breath sounds: Normal breath sounds.     Comments: 02 Abdominal:     General: Bowel sounds are normal. There is no distension.     Palpations: Abdomen is soft.     Tenderness: There is no abdominal tenderness.  Musculoskeletal:        General: Normal range of motion.     Cervical back: Neck supple.     Right lower leg: Edema present.     Left lower leg: Edema present.     Comments: Status post right ray amputation   Lymphadenopathy:     Cervical: No cervical adenopathy.  Skin:    General: Skin is warm and dry.     Comments: Right upper extremity A/V fistula: + thrill + bruit   Neurological:     Mental Status: She is alert and oriented to person, place, and time.  Psychiatric:        Mood and Affect: Mood normal.        ASSESSMENT/ PLAN:  TODAY  1. HCAP (heatlh care associated pneumonia)/ acute respiratory failure with hypoxia: is presently on 02 has albuterol inhaler 2 puffs every 6 hours as needed  2. Hypertension associated with diabetes: is stable 129/68: will continue labetolol 100 mg twice daily and will  monitor her  status.   3. Hypertensive heart and CKD; ESRD on dialysis: is presently stable b/p 129/68 will labetolol 100 mg twice daily continue asa 81 mg daily   4.  Gastroparesis due to DM; is stable will continue prilosec 20 mg daily   5. 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: hgb a1c 9.4: is without change: will continue novolin N 7 units twice daily lispro 5 units with meals for cbg >150   6.  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 due to type 2 diabetes mellitus/ hemodialysis dependent: is stable bun 11; creat 3.08; hemodialysis three days weekly; renvela 800 mg with lunch and supper; hectoral 1 mcg with dialysis   7. 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 disease: is stable hgb 11.6 will continue to monitor   9. Thrombocytopenia: is stable plt 152 has resolved  10. Hyperlipidemia associate with type 2 diabetes: will monitor   11. 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 Chronic constipation: is stable will continue miralax daily as needed and senna s daily as needed  13. Vit B 12 deficiency: is stable will continue monthly injections.    Time spent with patient: 45 minutes: medications; goals of care; therapy  needs.    Ok Edwards NP [facility]  Contact [phone] Monday through Friday 8am- 5pm  After hours call [phone]

## 2020-11-12 DIAGNOSIS — N186 End stage renal disease: Secondary | ICD-10-CM | POA: Diagnosis not present

## 2020-11-12 DIAGNOSIS — Z992 Dependence on renal dialysis: Secondary | ICD-10-CM | POA: Diagnosis not present

## 2020-11-13 DIAGNOSIS — E1122 Type 2 diabetes mellitus with diabetic chronic kidney disease: Secondary | ICD-10-CM | POA: Diagnosis not present

## 2020-11-13 DIAGNOSIS — N186 End stage renal disease: Secondary | ICD-10-CM | POA: Diagnosis not present

## 2020-11-13 DIAGNOSIS — Z992 Dependence on renal dialysis: Secondary | ICD-10-CM | POA: Diagnosis not present

## 2020-11-13 DIAGNOSIS — I120 Hypertensive chronic kidney disease with stage 5 chronic kidney disease or end stage renal disease: Secondary | ICD-10-CM | POA: Diagnosis not present

## 2020-11-13 DIAGNOSIS — E519 Thiamine deficiency, unspecified: Secondary | ICD-10-CM | POA: Diagnosis not present

## 2020-11-14 DIAGNOSIS — N186 End stage renal disease: Secondary | ICD-10-CM | POA: Diagnosis not present

## 2020-11-14 DIAGNOSIS — Z992 Dependence on renal dialysis: Secondary | ICD-10-CM | POA: Diagnosis not present

## 2020-11-15 ENCOUNTER — Non-Acute Institutional Stay (SKILLED_NURSING_FACILITY): Payer: Medicare Other | Admitting: Internal Medicine

## 2020-11-15 ENCOUNTER — Encounter: Payer: Self-pay | Admitting: Internal Medicine

## 2020-11-15 DIAGNOSIS — E1122 Type 2 diabetes mellitus with diabetic chronic kidney disease: Secondary | ICD-10-CM

## 2020-11-15 DIAGNOSIS — N186 End stage renal disease: Secondary | ICD-10-CM | POA: Diagnosis not present

## 2020-11-15 DIAGNOSIS — J9601 Acute respiratory failure with hypoxia: Secondary | ICD-10-CM | POA: Diagnosis not present

## 2020-11-15 DIAGNOSIS — I160 Hypertensive urgency: Secondary | ICD-10-CM

## 2020-11-15 DIAGNOSIS — I120 Hypertensive chronic kidney disease with stage 5 chronic kidney disease or end stage renal disease: Secondary | ICD-10-CM

## 2020-11-15 DIAGNOSIS — Z992 Dependence on renal dialysis: Secondary | ICD-10-CM

## 2020-11-15 DIAGNOSIS — E8770 Fluid overload, unspecified: Secondary | ICD-10-CM

## 2020-11-15 NOTE — Assessment & Plan Note (Signed)
Presently compensated with lungs clear to auscultation and insignificant peripheral edema.

## 2020-11-15 NOTE — Assessment & Plan Note (Addendum)
Accu-Cheks range from a low of 90 up to 333; she is on insulin which will be titrated based on average glucoses.   Uncontrolled diabetes suggested by A1c of 9.4%.  Possible discordance due to ESRD will need to be assessed.

## 2020-11-15 NOTE — Patient Instructions (Signed)
See assessment and plan under each diagnosis in the problem list and acutely for this visit 

## 2020-11-15 NOTE — Assessment & Plan Note (Signed)
Monday, Wednesday, and Friday schedule we will continue

## 2020-11-15 NOTE — Assessment & Plan Note (Signed)
Blood pressure now is actually soft and will require ongoing monitor.

## 2020-11-15 NOTE — Assessment & Plan Note (Signed)
Presently asymptomatic except for intermittent minor cough which is nonproductive.  Exam unremarkable and O2 sats excellent

## 2020-11-15 NOTE — Progress Notes (Signed)
NURSING HOME LOCATION: [facility] ROOM NUMBER:  [id number]  CODE STATUS:  Full Code  PCP:  [name] MD  This is a comprehensive admission note to this SNFperformed on this date less than 30 days from date of admission. Included are preadmission medical/surgical history; reconciled medication list; family history; social history and comprehensive review of systems.  Corrections and additions to the records were documented. Comprehensive physical exam was also performed. Additionally a clinical summary was entered for each active diagnosis pertinent to this admission in the Problem List to enhance continuity of care.  HPI: Patient was hospitalized 5/12 - 11/08/2020 presenting with chest pain and shortness of breath with clinical respiratory failure with hypoxia, hypertensive emergency, and volume overload in the context of missed dialysis and possible HCAP. Acute metabolic encephalopathy was present attributed to hypertensive emergency.  This did resolve with better blood pressure control.  Also she exhibited atypical chest pain which was attributed to the uncontrolled hypertension versus pneumonia.  This did resolve clinically with the treatments. Patient was dialyzed with ultrafiltration and completed course of IV cefepime 5/16-5/23 with subsequent transition to Augmentin 5/23-5/24 and azithromycin 5/24-5/26 for possible HCAP versus aspiration pneumonia. Uncontrolled diabetes was present with an A1c of 9.4.  She had been on Humulin N 7 units twice daily at home. Course was complicated by nausea and vomiting and significant clinical constipation.  The latter resolved with bowel regimens with continuation of as needed MiraLAX and Senokot S. 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 She was discharged to the SNF for rehab with PT/OT as tolerated.  Past medical and surgical history: Includes diabetes with ESRD, anemia of chronic disease, history of stroke,  and GERD.  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  Social history: Nondrinker; never smoked.  Family history: Reviewed; noncontributory due to advanced age.   Review of systems: She tells me that she was in the hospital because of "flu, no pneumonia".  She now states that she is "wonderful" with no active symptoms. She does admit to an intermittent "light cough".    She does state that she was constipated while hospitalized but this has resolved. She states that she has knots in her stomach from shots which are "sore".  Otherwise complete review of systems is negative.  Constitutional: No fever, significant weight change, fatigue  Eyes: No redness, discharge, pain, vision change ENT/mouth: No nasal congestion, purulent discharge, earache, change in hearing, sore throat  Cardiovascular: No chest pain, palpitations, paroxysmal nocturnal dyspnea, claudication, edema  Respiratory: No sputum production, hemoptysis, DOE, significant snoring, apnea  Gastrointestinal: No heartburn, dysphagia, nausea /vomiting, rectal bleeding, melena Genitourinary: No dysuria, hematuria, pyuria, incontinence, nocturia Musculoskeletal: No joint stiffness, joint swelling, weakness, pain Dermatologic: No rash, pruritus, change in appearance of skin Neurologic: No dizziness, headache, syncope, seizures, numbness, tingling Psychiatric: No significant anxiety, depression, insomnia, anorexia Endocrine: No change in hair/skin/nails, excessive thirst, excessive hunger, excessive urination  Hematologic/lymphatic: No significant bruising, lymphadenopathy, abnormal bleeding Allergy/immunology: No itchy/watery eyes, significant sneezing, urticaria, angioedema  Physical exam:  Pertinent or positive findings: She was alert and interactive.  There is a white shock of hair @ the  left anterior scalp line.  There is hyperpigmentation over the upper malar areas.  Slight proptosis is suggested of the right eye.  Sclera are slightly muddy.  She is edentulous having taken her  dentures out after eating.  There is minimal hirsutism over the chin.  Grade 3/4 systolic murmur is present at the base.  There is slight accentuation of the first and second heart sounds.  Abdomen is protuberant.  Pedal pulses are decreased.  She has trace edema peripherally.  Strength to opposition is fair-good.  General appearance: Adequately nourished; no acute distress, increased work of breathing is present.   Lymphatic: No lymphadenopathy about the head, neck, axilla. Eyes: No conjunctival inflammation or lid edema is present. There is no scleral icterus. Ears:  External ear exam shows no significant lesions or deformities.   Nose:  External nasal examination shows no deformity or inflammation. Nasal mucosa are pink and moist without lesions, exudates Oral exam: There is no oropharyngeal erythema or exudate. Neck:  No thyromegaly, masses, tenderness noted.    Heart:  No gallop, murmur, click, rub.  Lungs: Chest clear to auscultation without wheezes, rhonchi, rales, rubs. Abdomen: Bowel sounds are normal.  Abdomen is soft and nontender with no organomegaly, hernias, masses. GU: Deferred  Extremities:  No cyanosis, clubbing. Neurologic exam: Balance, Rhomberg, finger to nose testing could not be completed due to clinical state Skin: Warm & dry w/o tenting. No significant lesions or rash.  See clinical summary under each active problem in the Problem List with associated updated therapeutic plan

## 2020-11-16 DIAGNOSIS — Z992 Dependence on renal dialysis: Secondary | ICD-10-CM | POA: Diagnosis not present

## 2020-11-16 DIAGNOSIS — N186 End stage renal disease: Secondary | ICD-10-CM | POA: Diagnosis not present

## 2020-11-19 DIAGNOSIS — N186 End stage renal disease: Secondary | ICD-10-CM | POA: Diagnosis not present

## 2020-11-19 DIAGNOSIS — Z992 Dependence on renal dialysis: Secondary | ICD-10-CM | POA: Diagnosis not present

## 2020-11-21 ENCOUNTER — Non-Acute Institutional Stay (SKILLED_NURSING_FACILITY): Payer: Medicare Other | Admitting: Adult Health

## 2020-11-21 ENCOUNTER — Encounter: Payer: Self-pay | Admitting: Adult Health

## 2020-11-21 ENCOUNTER — Other Ambulatory Visit: Payer: Self-pay | Admitting: Adult Health

## 2020-11-21 DIAGNOSIS — I120 Hypertensive chronic kidney disease with stage 5 chronic kidney disease or end stage renal disease: Secondary | ICD-10-CM

## 2020-11-21 DIAGNOSIS — N186 End stage renal disease: Secondary | ICD-10-CM

## 2020-11-21 DIAGNOSIS — J9601 Acute respiratory failure with hypoxia: Secondary | ICD-10-CM | POA: Diagnosis not present

## 2020-11-21 DIAGNOSIS — E1122 Type 2 diabetes mellitus with diabetic chronic kidney disease: Secondary | ICD-10-CM | POA: Diagnosis not present

## 2020-11-21 DIAGNOSIS — R059 Cough, unspecified: Secondary | ICD-10-CM

## 2020-11-21 DIAGNOSIS — Z992 Dependence on renal dialysis: Secondary | ICD-10-CM

## 2020-11-21 DIAGNOSIS — I1311 Hypertensive heart and chronic kidney disease without heart failure, with stage 5 chronic kidney disease, or end stage renal disease: Secondary | ICD-10-CM

## 2020-11-21 DIAGNOSIS — J189 Pneumonia, unspecified organism: Secondary | ICD-10-CM | POA: Diagnosis not present

## 2020-11-21 MED ORDER — ALBUTEROL SULFATE HFA 108 (90 BASE) MCG/ACT IN AERS: 2.0000 | INHALATION_SPRAY | Freq: Four times a day (QID) | RESPIRATORY_TRACT | 0 refills | Status: DC | PRN

## 2020-11-21 MED ORDER — LABETALOL HCL 100 MG PO TABS: 100.0000 mg | ORAL_TABLET | Freq: Two times a day (BID) | ORAL | 0 refills | Status: DC

## 2020-11-21 MED ORDER — PENTOXIFYLLINE ER 400 MG PO TBCR: 400.0000 mg | EXTENDED_RELEASE_TABLET | Freq: Every day | ORAL | 0 refills | Status: DC

## 2020-11-21 MED ORDER — INSULIN NPH (HUMAN) (ISOPHANE) 100 UNIT/ML ~~LOC~~ SUSP: 7.0000 [IU] | Freq: Two times a day (BID) | SUBCUTANEOUS | 0 refills | Status: DC

## 2020-11-21 MED ORDER — INSULIN LISPRO 100 UNIT/ML IJ SOLN: 5.0000 [IU] | Freq: Three times a day (TID) | INTRAMUSCULAR | 0 refills | Status: DC

## 2020-11-21 MED ORDER — "BD INSULIN SYRINGE U/F 31G X 5/16"" 0.3 ML MISC": 1.0000 | Freq: Two times a day (BID) | 0 refills | Status: DC

## 2020-11-21 MED ORDER — BENZONATATE 100 MG PO CAPS: 100.0000 mg | ORAL_CAPSULE | Freq: Three times a day (TID) | ORAL | 0 refills | Status: DC | PRN

## 2020-11-21 MED ORDER — LIDOCAINE-PRILOCAINE 2.5-2.5 % EX CREA: 1.0000 "application " | TOPICAL_CREAM | CUTANEOUS | 0 refills | Status: DC

## 2020-11-21 MED ORDER — SEVELAMER CARBONATE 800 MG PO TABS: 800.0000 mg | ORAL_TABLET | Freq: Two times a day (BID) | ORAL | 0 refills | Status: DC

## 2020-11-21 NOTE — Progress Notes (Signed)
Location:   [facility] Room Number: 143 Place of Service:  SNF (31)    CODE STATUS: full   Allergies  Allergen Reactions  . Ambien [Zolpidem] Other (See Comments)    Hallucinations   . Reglan [Metoclopramide] Other (See Comments)    "makes me crazy"    Chief Complaint  Patient presents with  . Discharge Note    HPI:  She is being discharged to home with home health for pt/ot. She will not need any dme. She will need her prescriptions written and will need to follow up with her medical provider. She had been hospitalized for acute respiratory failure; missed dialysis appointments acute metabolic encephalopathy and pneumonia. She was admitted to this facility for short term rehab. She has participated in occupational and physical therapy to improve upon her independence with her adls. She is now ready to complete therapy on a home health basis.     Past Medical History:  Diagnosis Date  . Anemia   . Cataract   . Chronic kidney disease   . CVA (cerebral infarction)   . 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 Dialysis patient ([place])    M, W, F  . Fistula    R arm  . GERD (gastroesophageal reflux disease)   . Hypertension   . Renal disorder   . Shortness of breath   . Stroke [facility])    right side weakness    Past Surgical History:  Procedure Laterality Date  . AMPUTATION Right 01/13/2020   Procedure: AMPUTATION RIGHT FOOT 1ST RAY;  Surgeon: Newt Minion, MD;  Location: [place];  Service: Orthopedics;  Laterality: Right;  . AV FISTULA PLACEMENT Right 09/08/2013   Procedure: CREATION OF RIGHT BRACHIAL CEPHALIC ARTERIOVENOUS FISTULA ;  Surgeon: Mal Misty, MD;  Location: [place];  Service: Vascular;  Laterality: Right;  . BASCILIC VEIN TRANSPOSITION Right 01/26/2014   Procedure: Right Arm BASILIC VEIN TRANSPOSITION;  Surgeon: Mal Misty, MD;  Location: [place];  Service: Vascular;  Laterality: Right;  . CATARACT  EXTRACTION W/PHACO  11/20/2011   Procedure: CATARACT EXTRACTION PHACO AND INTRAOCULAR LENS PLACEMENT (IOC);  Surgeon: Tonny Branch, MD;  Location: [facility];  Service: Ophthalmology;  Laterality: Right;  CDE 18.82  . CATARACT EXTRACTION W/PHACO Left 11/18/2012   Procedure: CATARACT EXTRACTION PHACO AND INTRAOCULAR LENS PLACEMENT (IOC);  Surgeon: Tonny Branch, MD;  Location: [facility];  Service: Ophthalmology;  Laterality: Left;  CDE: 18.97  . COLONOSCOPY N/A 02/09/2013   Procedure: COLONOSCOPY;  Surgeon: Rogene Houston, MD;  [facility];  Service: Endoscopy;  Laterality: N/A;  305-moved to 220 Ann to notify pt  . COLONOSCOPY N/A 07/08/2018   Procedure: COLONOSCOPY;  Surgeon: Rogene Houston, MD;  [facility];  Service: Endoscopy;  Laterality: N/A;  930  . INSERTION OF DIALYSIS CATHETER Right 06/24/2013   Procedure: INSERTION OF DIALYSIS CATHETER: Ultrasound guided;  Surgeon: Serafina Mitchell, MD;  Location: [place];  Service: Vascular;  Laterality: Right;  . LIGATION OF ARTERIOVENOUS  FISTULA Right 01/26/2014   Procedure: LIGATION OF ARTERIOVENOUS  FISTULA;  Surgeon: Mal Misty, MD;  Location: [place];  Service: Vascular;  Laterality: Right;  . LOOP RECORDER IMPLANT N/A 07/21/2013   Procedure: LOOP RECORDER IMPLANT;  Surgeon: Evans Lance, MD;  Location: [facility] CATH LAB;  Service: Cardiovascular;  Laterality: N/A;  . POLYPECTOMY  07/08/2018   Procedure: POLYPECTOMY;  Surgeon: Rogene Houston, MD;  [facility];  Service: Endoscopy;;  Descending colon polyps x 2   . TEE WITHOUT CARDIOVERSION N/A 07/21/2013   Procedure: TRANSESOPHAGEAL ECHOCARDIOGRAM (TEE);  Surgeon: Dorothy Spark, MD;  Location[facility] ENDOSCOPY;  Service: Cardiovascular;  Laterality: N/A;    Social History   Socioeconomic History  . Marital status: Widowed    Spouse name: Not on file  . Number of children: Not on file  . Years of education: Not on file  . Highest education level: Not on file  Occupational  History  . Not on file  Tobacco Use  . Smoking status: Never Smoker  . Smokeless tobacco: Never Used  Vaping Use  . Vaping Use: Never used  Substance and Sexual Activity  . Alcohol use: No  . Drug use: No  . Sexual activity: Not on file  Other Topics Concern  . Not on file  Social History Narrative   ** Merged History Encounter **       Social Determinants of Health   Financial Resource Strain: Not on file  Food Insecurity: Not on file  Transportation Needs: Not on file  Physical Activity: Not on file  Stress: Not on file  Social Connections: Not on file  Intimate Partner Violence: Not on file   Family History  Problem Relation Age of Onset  . Cancer Sister   . Cancer Brother   . Anesthesia problems Neg Hx   . Hypotension Neg Hx   . Malignant hyperthermia Neg Hx   . Pseudochol deficiency Neg Hx     VITAL SIGNS BP (!) 147/69   Pulse 92   Temp 97.9 F (36.6 C)   Resp 18   Ht 5\' 7"  (1.702 m)   Wt 153 lb 9.6 oz (69.7 kg)   SpO2 99%   BMI 24.06 kg/m   Patient's Medications  New Prescriptions   No medications on file  Previous Medications   ALBUTEROL (VENTOLIN HFA) 108 (90 BASE) MCG/ACT INHALER    Inhale 2 puffs into the lungs every 6 (six) hours as needed for wheezing or shortness of breath.   ASPIRIN 81 MG CHEWABLE TABLET    Chew 2 tablets (162 mg total) by mouth daily.   BD INSULIN SYRINGE U/F 31G X 5/16" 0.3 ML MISC    Inject 1 each into the skin 2 (two) times daily. as directed   BENZONATATE (TESSALON) 100 MG CAPSULE    Take 1 capsule (100 mg total) by mouth 3 (three) times daily as needed for cough.   CYANOCOBALAMIN (,VITAMIN B-12,) 1000 MCG/ML INJECTION    Inject 1 mL into the muscle every 30 (thirty) days.   INSULIN LISPRO (HUMALOG) 100 UNIT/ML INJECTION    Inject 0.05 mLs (5 Units total) into the skin 3 (three) times daily before meals. For cbg >150   INSULIN NPH HUMAN (NOVOLIN N) 100 UNIT/ML INJECTION    Inject 0.07 mLs (7 Units total) into the skin 2  (two) times daily before a meal.   LABETALOL (NORMODYNE) 100 MG TABLET    Take 1 tablet (100 mg total) by mouth 2 (two) times daily.   LIDOCAINE-PRILOCAINE (EMLA) CREAM    Apply 1 application topically every Monday, Wednesday, and Friday.   MULTIVITAMIN (RENA-VIT) TABS TABLET    Take 1 tablet by mouth at bedtime.   NON FORMULARY    Diet: NAS, Cons CHO   NON FORMULARY    Oxygen @ 2L/min via [place] PRN to keep sat  above 90%  Special Instructions: Document O2 sat qshift. As Needed; PRN 1, PRN 2, PRN 3, PRN 4, PRN 5, PRN 6   OMEPRAZOLE (PRILOSEC) 20 MG CAPSULE    Take 1 capsule (20 mg total) by mouth daily.   PENTOXIFYLLINE (TRENTAL) 400 MG CR TABLET    Take 1 tablet (400 mg total) by mouth daily with breakfast.   POLYETHYLENE GLYCOL (MIRALAX / GLYCOLAX) 17 G PACKET    Take 17 g by mouth 2 (two) times daily as needed for mild constipation.   SENNA-DOCUSATE (SENOKOT-S) 8.6-50 MG TABLET    Take 1 tablet by mouth 2 (two) times daily as needed for moderate constipation.   SEVELAMER CARBONATE (RENVELA) 800 MG TABLET    Take 1 tablet (800 mg total) by mouth 2 (two) times daily before lunch and supper.  Modified Medications   No medications on file  Discontinued Medications   No medications on file     SIGNIFICANT DIAGNOSTIC EXAMS   PREVIOUS   10-25-20: chest x-ray:  Cardiac shadow is enlarged. Loop recorder is again seen. Lungs are well aerated without focal infiltrate or sizable effusion. No bony abnormality is seen. Stenting is noted in the right arm.  11-02-20: chest x-ray:  Stable cardiomediastinal silhouette. Both lungs are clear. The visualized skeletal structures are unremarkable.  11-03-20: ct of head:  1. No acute intracranial abnormality. 2. Stable non contrast CT appearance of chronic ischemic disease since last year. 3. Mild chronic sphenoid sinusitis.  11-06-20: KUB:  No acute abnormality identified. Large amount of stool noted throughout the colon. No bowel distention or free  air.  NO NEW EXAMS   LABS REVIEWED PREVIOUS   10-25-20: wbc 18.1; hgb 11.8; hct 34.6; mcv 93.0 plt 130; glucose 610; bun 82; creat  10.55 k +6.2 na++ 127; ca 7.7; GFR 3 10-26-20: wbc 20.6; hgb 10.9; hct 31.7; mcv 92.2 plt 90; glucose 354; bun 85; creat 11.02; k+ 5.6; na++ 129; ca 7.7; GFR 3; ast 42; albumin 3.5 blood culture no growth  10-29-20: wbc 14.6; hgb 10.4; hct 30.3; mcv 88.9 plt 137; glucose 190; bun 53; creat 8.25; k+ 4.3; na++ 133; ca 8.0; GFR 4 phos 3.0 albumin 2.5  10-31-20: wbc 8,5; hgb 10.6; hct 31.4; mcv 91.3 plt 149; glucose 336; bun 37; creat 6.93; k+ 3.9; na++ 131; ca 8.1; GFR 6; phos 3.0; albumin 2.6 hgb a1c 9.4 11-04-20: wbc 11.9; hgb 12.4; hct 37.5; mcv 92.6 plt 192; glucose 153; bun 25; creat 4.90; k+ 4.4; na++ 132; ca 9.2 GFR 8; phos 4.1; total bili 1.6 albumin 3.0 11-09-20: wbc 10.6; hgb 11.6; hct 35.2; mcv 94.9 plt 152; glucose 163; bun 11; creat 3.08; k+ 3.8; na++ 132; ca 8.5 GFR 15   NO NEW LABS.   Review of Systems  Constitutional:  Negative for malaise/fatigue.  Respiratory:  Negative for cough and shortness of breath.   Cardiovascular:  Negative for chest pain, palpitations and leg swelling.  Gastrointestinal:  Negative for abdominal pain, constipation and heartburn.  Musculoskeletal:  Negative for back pain, joint pain and myalgias.  Skin: Negative.   Neurological:  Negative for dizziness.  Psychiatric/Behavioral:  The patient is not nervous/anxious.    Physical Exam Constitutional:      General: She is not in acute distress.    Appearance: She is well-developed. She is not diaphoretic.  Neck:     Thyroid: No thyromegaly.  Cardiovascular:     Rate and Rhythm: Normal rate and regular rhythm.  Heart sounds: Normal heart sounds.  Pulmonary:     Effort: Pulmonary effort is normal. No respiratory distress.     Breath sounds: Normal breath sounds.     Comments: 02 Abdominal:     General: Bowel sounds are normal. There is no distension.     Palpations:  Abdomen is soft.     Tenderness: There is no abdominal tenderness.  Musculoskeletal:        General: Normal range of motion.     Cervical back: Neck supple.     Right lower leg: Edema present.     Left lower leg: Edema present.     Comments: Status post right ray amputation    Lymphadenopathy:     Cervical: No cervical adenopathy.  Skin:    General: Skin is warm and dry.     Comments: Right upper extremity A/V fistula: + thrill + bruit    Neurological:     Mental Status: She is alert and oriented to person, place, and time.     ASSESSMENT/ PLAN:   Patient is being discharged with the following home health services:  pt/ot to evaluate and treat as indicated for gait balance strength adl training.   Patient is being discharged with the following durable medical equipment:  none needed   Patient has been advised to f/u with their PCP in 1-2 weeks to bring them up to date on their rehab stay.  Social services at facility was responsible for arranging this appointment.  Pt was provided with a 30 day supply of prescriptions for medications and refills must be obtained from their PCP.  For controlled substances, a more limited supply may be provided adequate until PCP appointment only.  A 30 day supply of her prescription medications have been sent to [facility] [address]  Time spent with patient 35 minutes: medications; dme; home health needs.   Ok Edwards NP [facility]  Contact [phone] Monday through Friday 8am- 5pm  After hours call [phone]

## 2020-11-22 ENCOUNTER — Encounter: Payer: Self-pay | Admitting: Adult Health

## 2020-11-22 NOTE — Progress Notes (Signed)
Location:  [place] Room Number: 143-P Place of Service:  SNF (31)   CODE STATUS: Full  Allergies  Allergen Reactions   Ambien [Zolpidem] Other (See Comments)    Hallucinations    Reglan [Metoclopramide] Other (See Comments)    "makes me crazy"    Chief Complaint  Patient presents with   Acute Visit    Care plan meeting.    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   Dialysis patient ([place])    M, W, F   Fistula    R arm   GERD (gastroesophageal reflux disease)    Hypertension    Renal disorder    Shortness of breath    Stroke [facility]    right side weakness    Past Surgical History:  Procedure Laterality Date   AMPUTATION Right 01/13/2020   Procedure: AMPUTATION RIGHT FOOT 1ST RAY;  Surgeon: Newt Minion, MD;  Location: [place];  Service: Orthopedics;  Laterality: Right;   AV FISTULA PLACEMENT Right 09/08/2013   Procedure: CREATION OF RIGHT BRACHIAL CEPHALIC ARTERIOVENOUS FISTULA ;  Surgeon: Mal Misty, MD;  Location: [place];  Service: Vascular;  Laterality: Right;   [place] Right 01/26/2014   Procedure: Right Arm BASILIC VEIN TRANSPOSITION;  Surgeon: Mal Misty, MD;  Location: [place];  Service: Vascular;  Laterality: Right;   CATARACT EXTRACTION W/PHACO  11/20/2011   Procedure: CATARACT EXTRACTION PHACO AND INTRAOCULAR LENS PLACEMENT ([place]);  Surgeon: Tonny Branch, MD;  Location: [facility];  Service: Ophthalmology;  Laterality: Right;  CDE 18.82   CATARACT EXTRACTION W/PHACO Left 11/18/2012   Procedure: CATARACT EXTRACTION PHACO AND INTRAOCULAR LENS PLACEMENT (IOC);  Surgeon: Tonny Branch, MD;  Location: [facility];  Service: Ophthalmology;  Laterality: Left;  CDE: 18.97   COLONOSCOPY N/A 02/09/2013   Procedure: COLONOSCOPY;  Surgeon: Rogene Houston, MD;  [facility];   Service: Endoscopy;  Laterality: N/A;  305-moved to 220 Ann to notify pt   COLONOSCOPY N/A 07/08/2018   Procedure: COLONOSCOPY;  Surgeon: Rogene Houston, MD;  [facility];  Service: Endoscopy;  Laterality: N/A;  930   INSERTION OF DIALYSIS CATHETER Right 06/24/2013   Procedure: INSERTION OF DIALYSIS CATHETER: Ultrasound guided;  Surgeon: Serafina Mitchell, MD;  Location: [place];  Service: Vascular;  Laterality: Right;   LIGATION OF ARTERIOVENOUS  FISTULA Right 01/26/2014   Procedure: LIGATION OF ARTERIOVENOUS  FISTULA;  Surgeon: Mal Misty, MD;  Location: [place];  Service: Vascular;  Laterality: Right;   LOOP RECORDER IMPLANT N/A 07/21/2013   Procedure: LOOP RECORDER IMPLANT;  Surgeon: Evans Lance, MD;  Location: [facility] CATH LAB;  Service: Cardiovascular;  Laterality: N/A;   POLYPECTOMY  07/08/2018   Procedure: POLYPECTOMY;  Surgeon: Rogene Houston, MD;  [facility];  Service: Endoscopy;;  Descending colon polyps x 2    TEE WITHOUT CARDIOVERSION N/A 07/21/2013   Procedure: TRANSESOPHAGEAL ECHOCARDIOGRAM (TEE);  Surgeon: Dorothy Spark, MD;  Location: [facility] ENDOSCOPY;  Service: Cardiovascular;  Laterality: N/A;    Social History   Socioeconomic History   Marital status: Widowed    Spouse name: Not on file   Number of children: Not on file   Years of education: Not on  file   Highest education level: Not on file  Occupational History   Not on file  Tobacco Use   Smoking status: Never   Smokeless tobacco: Never  Vaping Use   Vaping Use: Never used  Substance and Sexual Activity   Alcohol use: No   Drug use: No   Sexual activity: Not on file  Other Topics Concern   Not on file  Social History Narrative   ** Merged History Encounter **       Social Determinants of Health   Financial Resource Strain: Not on file  Food Insecurity: Not on file  Transportation Needs: Not on file  Physical Activity: Not on file  Stress: Not on file  Social Connections: Not on  file  Intimate Partner Violence: Not on file   Family History  Problem Relation Age of Onset   Cancer Sister    Cancer Brother    Anesthesia problems Neg Hx    Hypotension Neg Hx    Malignant hyperthermia Neg Hx    Pseudochol deficiency Neg Hx       VITAL SIGNS BP (!) 147/69   Pulse 92   Temp 97.9 F (36.6 C)   Resp 16   Ht 5\' 7"  (1.702 m)   Wt 153 lb 9.6 oz (69.7 kg)   SpO2 99%   BMI 24.06 kg/m   Outpatient Encounter Medications as of 11/22/2020  Medication Sig   albuterol (VENTOLIN HFA) 108 (90 Base) MCG/ACT inhaler Inhale 2 puffs into the lungs every 6 (six) hours as needed for wheezing or shortness of breath.   aspirin 81 MG chewable tablet Chew 2 tablets (162 mg total) by mouth daily.   BD INSULIN SYRINGE U/F 31G X 5/16" 0.3 ML MISC Inject 1 each into the skin 2 (two) times daily. as directed   benzonatate (TESSALON) 100 MG capsule Take 1 capsule (100 mg total) by mouth 3 (three) times daily as needed for cough.   cyanocobalamin (,VITAMIN B-12,) 1000 MCG/ML injection Inject 1 mL into the muscle every 30 (thirty) days.   insulin lispro (HUMALOG) 100 UNIT/ML injection Inject 0.05 mLs (5 Units total) into the skin 3 (three) times daily before meals. For cbg >150   insulin NPH Human (NOVOLIN N) 100 UNIT/ML injection Inject 0.07 mLs (7 Units total) into the skin 2 (two) times daily before a meal.   labetalol (NORMODYNE) 100 MG tablet Take 1 tablet (100 mg total) by mouth 2 (two) times daily.   lidocaine-prilocaine (EMLA) cream Apply 1 application topically every Monday, Wednesday, and Friday.   multivitamin (RENA-VIT) TABS tablet Take 1 tablet by mouth at bedtime.   NON FORMULARY Diet: NAS, Cons CHO   NON FORMULARY Oxygen @ 2L/min via [place] PRN to keep sat above 90%  Special Instructions: Document O2 sat qshift. As Needed; PRN 1, PRN 2, PRN 3, PRN 4, PRN 5, PRN 6   omeprazole (PRILOSEC) 20 MG capsule Take 1 capsule (20 mg total) by mouth daily.   pentoxifylline (TRENTAL) 400 MG  CR tablet Take 1 tablet (400 mg total) by mouth daily with breakfast.   polyethylene glycol (MIRALAX / GLYCOLAX) 17 g packet Take 17 g by mouth 2 (two) times daily as needed for mild constipation.   senna-docusate (SENOKOT-S) 8.6-50 MG tablet Take 1 tablet by mouth 2 (two) times daily as needed for moderate constipation.   sevelamer carbonate (RENVELA) 800 MG tablet Take 1 tablet (800 mg total) by mouth 2 (two) times daily before lunch and supper.  No facility-administered encounter medications on file as of 11/22/2020.     SIGNIFICANT DIAGNOSTIC EXAMS       ASSESSMENT/ PLAN:     Ok Edwards NP [facility]  Contact [phone] Monday through Friday 8am- 5pm  After hours call [phone]  This encounter was created in error - please disregard.

## 2020-11-23 DIAGNOSIS — N186 End stage renal disease: Secondary | ICD-10-CM | POA: Diagnosis not present

## 2020-11-23 DIAGNOSIS — Z992 Dependence on renal dialysis: Secondary | ICD-10-CM | POA: Diagnosis not present

## 2020-11-26 DIAGNOSIS — N2581 Secondary hyperparathyroidism of renal origin: Secondary | ICD-10-CM | POA: Diagnosis not present

## 2020-11-26 DIAGNOSIS — D509 Iron deficiency anemia, unspecified: Secondary | ICD-10-CM | POA: Diagnosis not present

## 2020-11-26 DIAGNOSIS — N186 End stage renal disease: Secondary | ICD-10-CM | POA: Diagnosis not present

## 2020-11-26 DIAGNOSIS — Z992 Dependence on renal dialysis: Secondary | ICD-10-CM | POA: Diagnosis not present

## 2020-11-27 DIAGNOSIS — D631 Anemia in chronic kidney disease: Secondary | ICD-10-CM | POA: Diagnosis not present

## 2020-11-27 DIAGNOSIS — E1122 Type 2 diabetes mellitus with diabetic chronic kidney disease: Secondary | ICD-10-CM | POA: Diagnosis not present

## 2020-11-27 DIAGNOSIS — Z89411 Acquired absence of right great toe: Secondary | ICD-10-CM | POA: Diagnosis not present

## 2020-11-27 DIAGNOSIS — N186 End stage renal disease: Secondary | ICD-10-CM | POA: Diagnosis not present

## 2020-11-27 DIAGNOSIS — I152 Hypertension secondary to endocrine disorders: Secondary | ICD-10-CM | POA: Diagnosis not present

## 2020-11-27 DIAGNOSIS — J9601 Acute respiratory failure with hypoxia: Secondary | ICD-10-CM | POA: Diagnosis not present

## 2020-11-27 DIAGNOSIS — E1169 Type 2 diabetes mellitus with other specified complication: Secondary | ICD-10-CM | POA: Diagnosis not present

## 2020-11-27 DIAGNOSIS — E1143 Type 2 diabetes mellitus with diabetic autonomic (poly)neuropathy: Secondary | ICD-10-CM | POA: Diagnosis not present

## 2020-11-27 DIAGNOSIS — I1311 Hypertensive heart and chronic kidney disease without heart failure, with stage 5 chronic kidney disease, or end stage renal disease: Secondary | ICD-10-CM | POA: Diagnosis not present

## 2020-11-28 DIAGNOSIS — Z992 Dependence on renal dialysis: Secondary | ICD-10-CM | POA: Diagnosis not present

## 2020-11-28 DIAGNOSIS — N186 End stage renal disease: Secondary | ICD-10-CM | POA: Diagnosis not present

## 2020-11-29 DIAGNOSIS — Z89411 Acquired absence of right great toe: Secondary | ICD-10-CM | POA: Diagnosis not present

## 2020-11-29 DIAGNOSIS — I1311 Hypertensive heart and chronic kidney disease without heart failure, with stage 5 chronic kidney disease, or end stage renal disease: Secondary | ICD-10-CM | POA: Diagnosis not present

## 2020-11-29 DIAGNOSIS — N186 End stage renal disease: Secondary | ICD-10-CM | POA: Diagnosis not present

## 2020-11-29 DIAGNOSIS — E1122 Type 2 diabetes mellitus with diabetic chronic kidney disease: Secondary | ICD-10-CM | POA: Diagnosis not present

## 2020-11-29 DIAGNOSIS — I152 Hypertension secondary to endocrine disorders: Secondary | ICD-10-CM | POA: Diagnosis not present

## 2020-11-29 DIAGNOSIS — E1143 Type 2 diabetes mellitus with diabetic autonomic (poly)neuropathy: Secondary | ICD-10-CM | POA: Diagnosis not present

## 2020-11-29 DIAGNOSIS — J9601 Acute respiratory failure with hypoxia: Secondary | ICD-10-CM | POA: Diagnosis not present

## 2020-11-29 DIAGNOSIS — E1169 Type 2 diabetes mellitus with other specified complication: Secondary | ICD-10-CM | POA: Diagnosis not present

## 2020-11-29 DIAGNOSIS — D631 Anemia in chronic kidney disease: Secondary | ICD-10-CM | POA: Diagnosis not present

## 2020-11-30 DIAGNOSIS — N186 End stage renal disease: Secondary | ICD-10-CM | POA: Diagnosis not present

## 2020-11-30 DIAGNOSIS — Z992 Dependence on renal dialysis: Secondary | ICD-10-CM | POA: Diagnosis not present

## 2020-12-03 DIAGNOSIS — N186 End stage renal disease: Secondary | ICD-10-CM | POA: Diagnosis not present

## 2020-12-03 DIAGNOSIS — Z992 Dependence on renal dialysis: Secondary | ICD-10-CM | POA: Diagnosis not present

## 2020-12-04 DIAGNOSIS — E538 Deficiency of other specified B group vitamins: Secondary | ICD-10-CM | POA: Diagnosis not present

## 2020-12-05 ENCOUNTER — Other Ambulatory Visit: Payer: Self-pay | Admitting: Adult Health

## 2020-12-05 DIAGNOSIS — Z992 Dependence on renal dialysis: Secondary | ICD-10-CM | POA: Diagnosis not present

## 2020-12-05 DIAGNOSIS — N186 End stage renal disease: Secondary | ICD-10-CM | POA: Diagnosis not present

## 2020-12-06 DIAGNOSIS — E1122 Type 2 diabetes mellitus with diabetic chronic kidney disease: Secondary | ICD-10-CM | POA: Diagnosis not present

## 2020-12-06 DIAGNOSIS — E1143 Type 2 diabetes mellitus with diabetic autonomic (poly)neuropathy: Secondary | ICD-10-CM | POA: Diagnosis not present

## 2020-12-06 DIAGNOSIS — N186 End stage renal disease: Secondary | ICD-10-CM | POA: Diagnosis not present

## 2020-12-06 DIAGNOSIS — Z89411 Acquired absence of right great toe: Secondary | ICD-10-CM | POA: Diagnosis not present

## 2020-12-06 DIAGNOSIS — E1169 Type 2 diabetes mellitus with other specified complication: Secondary | ICD-10-CM | POA: Diagnosis not present

## 2020-12-06 DIAGNOSIS — I152 Hypertension secondary to endocrine disorders: Secondary | ICD-10-CM | POA: Diagnosis not present

## 2020-12-06 DIAGNOSIS — D631 Anemia in chronic kidney disease: Secondary | ICD-10-CM | POA: Diagnosis not present

## 2020-12-06 DIAGNOSIS — I1311 Hypertensive heart and chronic kidney disease without heart failure, with stage 5 chronic kidney disease, or end stage renal disease: Secondary | ICD-10-CM | POA: Diagnosis not present

## 2020-12-06 DIAGNOSIS — J9601 Acute respiratory failure with hypoxia: Secondary | ICD-10-CM | POA: Diagnosis not present

## 2020-12-07 DIAGNOSIS — Z992 Dependence on renal dialysis: Secondary | ICD-10-CM | POA: Diagnosis not present

## 2020-12-07 DIAGNOSIS — N186 End stage renal disease: Secondary | ICD-10-CM | POA: Diagnosis not present

## 2020-12-10 DIAGNOSIS — N2581 Secondary hyperparathyroidism of renal origin: Secondary | ICD-10-CM | POA: Diagnosis not present

## 2020-12-10 DIAGNOSIS — N186 End stage renal disease: Secondary | ICD-10-CM | POA: Diagnosis not present

## 2020-12-10 DIAGNOSIS — Z992 Dependence on renal dialysis: Secondary | ICD-10-CM | POA: Diagnosis not present

## 2020-12-11 DIAGNOSIS — D631 Anemia in chronic kidney disease: Secondary | ICD-10-CM | POA: Diagnosis not present

## 2020-12-11 DIAGNOSIS — N186 End stage renal disease: Secondary | ICD-10-CM | POA: Diagnosis not present

## 2020-12-11 DIAGNOSIS — J9601 Acute respiratory failure with hypoxia: Secondary | ICD-10-CM | POA: Diagnosis not present

## 2020-12-11 DIAGNOSIS — E1143 Type 2 diabetes mellitus with diabetic autonomic (poly)neuropathy: Secondary | ICD-10-CM | POA: Diagnosis not present

## 2020-12-11 DIAGNOSIS — Z89411 Acquired absence of right great toe: Secondary | ICD-10-CM | POA: Diagnosis not present

## 2020-12-11 DIAGNOSIS — I152 Hypertension secondary to endocrine disorders: Secondary | ICD-10-CM | POA: Diagnosis not present

## 2020-12-11 DIAGNOSIS — E1169 Type 2 diabetes mellitus with other specified complication: Secondary | ICD-10-CM | POA: Diagnosis not present

## 2020-12-11 DIAGNOSIS — E1122 Type 2 diabetes mellitus with diabetic chronic kidney disease: Secondary | ICD-10-CM | POA: Diagnosis not present

## 2020-12-11 DIAGNOSIS — I1311 Hypertensive heart and chronic kidney disease without heart failure, with stage 5 chronic kidney disease, or end stage renal disease: Secondary | ICD-10-CM | POA: Diagnosis not present

## 2020-12-12 DIAGNOSIS — Z992 Dependence on renal dialysis: Secondary | ICD-10-CM | POA: Diagnosis not present

## 2020-12-12 DIAGNOSIS — N186 End stage renal disease: Secondary | ICD-10-CM | POA: Diagnosis not present

## 2020-12-13 DIAGNOSIS — E1122 Type 2 diabetes mellitus with diabetic chronic kidney disease: Secondary | ICD-10-CM | POA: Diagnosis not present

## 2020-12-13 DIAGNOSIS — I1311 Hypertensive heart and chronic kidney disease without heart failure, with stage 5 chronic kidney disease, or end stage renal disease: Secondary | ICD-10-CM | POA: Diagnosis not present

## 2020-12-13 DIAGNOSIS — E519 Thiamine deficiency, unspecified: Secondary | ICD-10-CM | POA: Diagnosis not present

## 2020-12-13 DIAGNOSIS — D631 Anemia in chronic kidney disease: Secondary | ICD-10-CM | POA: Diagnosis not present

## 2020-12-13 DIAGNOSIS — Z992 Dependence on renal dialysis: Secondary | ICD-10-CM | POA: Diagnosis not present

## 2020-12-13 DIAGNOSIS — Z89411 Acquired absence of right great toe: Secondary | ICD-10-CM | POA: Diagnosis not present

## 2020-12-13 DIAGNOSIS — I120 Hypertensive chronic kidney disease with stage 5 chronic kidney disease or end stage renal disease: Secondary | ICD-10-CM | POA: Diagnosis not present

## 2020-12-13 DIAGNOSIS — I152 Hypertension secondary to endocrine disorders: Secondary | ICD-10-CM | POA: Diagnosis not present

## 2020-12-13 DIAGNOSIS — E1143 Type 2 diabetes mellitus with diabetic autonomic (poly)neuropathy: Secondary | ICD-10-CM | POA: Diagnosis not present

## 2020-12-13 DIAGNOSIS — J9601 Acute respiratory failure with hypoxia: Secondary | ICD-10-CM | POA: Diagnosis not present

## 2020-12-13 DIAGNOSIS — N186 End stage renal disease: Secondary | ICD-10-CM | POA: Diagnosis not present

## 2020-12-13 DIAGNOSIS — E1169 Type 2 diabetes mellitus with other specified complication: Secondary | ICD-10-CM | POA: Diagnosis not present

## 2020-12-14 DIAGNOSIS — N186 End stage renal disease: Secondary | ICD-10-CM | POA: Diagnosis not present

## 2020-12-14 DIAGNOSIS — Z992 Dependence on renal dialysis: Secondary | ICD-10-CM | POA: Diagnosis not present

## 2020-12-17 DIAGNOSIS — Z992 Dependence on renal dialysis: Secondary | ICD-10-CM | POA: Diagnosis not present

## 2020-12-17 DIAGNOSIS — N186 End stage renal disease: Secondary | ICD-10-CM | POA: Diagnosis not present

## 2020-12-18 DIAGNOSIS — D631 Anemia in chronic kidney disease: Secondary | ICD-10-CM | POA: Diagnosis not present

## 2020-12-18 DIAGNOSIS — E1143 Type 2 diabetes mellitus with diabetic autonomic (poly)neuropathy: Secondary | ICD-10-CM | POA: Diagnosis not present

## 2020-12-18 DIAGNOSIS — E1169 Type 2 diabetes mellitus with other specified complication: Secondary | ICD-10-CM | POA: Diagnosis not present

## 2020-12-18 DIAGNOSIS — Z89411 Acquired absence of right great toe: Secondary | ICD-10-CM | POA: Diagnosis not present

## 2020-12-18 DIAGNOSIS — I1311 Hypertensive heart and chronic kidney disease without heart failure, with stage 5 chronic kidney disease, or end stage renal disease: Secondary | ICD-10-CM | POA: Diagnosis not present

## 2020-12-18 DIAGNOSIS — I152 Hypertension secondary to endocrine disorders: Secondary | ICD-10-CM | POA: Diagnosis not present

## 2020-12-18 DIAGNOSIS — K5901 Slow transit constipation: Secondary | ICD-10-CM | POA: Diagnosis not present

## 2020-12-18 DIAGNOSIS — E1122 Type 2 diabetes mellitus with diabetic chronic kidney disease: Secondary | ICD-10-CM | POA: Diagnosis not present

## 2020-12-18 DIAGNOSIS — J9601 Acute respiratory failure with hypoxia: Secondary | ICD-10-CM | POA: Diagnosis not present

## 2020-12-18 DIAGNOSIS — D510 Vitamin B12 deficiency anemia due to intrinsic factor deficiency: Secondary | ICD-10-CM | POA: Diagnosis not present

## 2020-12-18 DIAGNOSIS — N186 End stage renal disease: Secondary | ICD-10-CM | POA: Diagnosis not present

## 2020-12-18 DIAGNOSIS — I10 Essential (primary) hypertension: Secondary | ICD-10-CM | POA: Diagnosis not present

## 2020-12-19 DIAGNOSIS — N186 End stage renal disease: Secondary | ICD-10-CM | POA: Diagnosis not present

## 2020-12-19 DIAGNOSIS — Z992 Dependence on renal dialysis: Secondary | ICD-10-CM | POA: Diagnosis not present

## 2020-12-20 DIAGNOSIS — J9601 Acute respiratory failure with hypoxia: Secondary | ICD-10-CM | POA: Diagnosis not present

## 2020-12-20 DIAGNOSIS — D631 Anemia in chronic kidney disease: Secondary | ICD-10-CM | POA: Diagnosis not present

## 2020-12-20 DIAGNOSIS — Z89411 Acquired absence of right great toe: Secondary | ICD-10-CM | POA: Diagnosis not present

## 2020-12-20 DIAGNOSIS — E1122 Type 2 diabetes mellitus with diabetic chronic kidney disease: Secondary | ICD-10-CM | POA: Diagnosis not present

## 2020-12-20 DIAGNOSIS — I152 Hypertension secondary to endocrine disorders: Secondary | ICD-10-CM | POA: Diagnosis not present

## 2020-12-20 DIAGNOSIS — I1311 Hypertensive heart and chronic kidney disease without heart failure, with stage 5 chronic kidney disease, or end stage renal disease: Secondary | ICD-10-CM | POA: Diagnosis not present

## 2020-12-20 DIAGNOSIS — E1169 Type 2 diabetes mellitus with other specified complication: Secondary | ICD-10-CM | POA: Diagnosis not present

## 2020-12-20 DIAGNOSIS — E1143 Type 2 diabetes mellitus with diabetic autonomic (poly)neuropathy: Secondary | ICD-10-CM | POA: Diagnosis not present

## 2020-12-20 DIAGNOSIS — N186 End stage renal disease: Secondary | ICD-10-CM | POA: Diagnosis not present

## 2020-12-21 DIAGNOSIS — Z992 Dependence on renal dialysis: Secondary | ICD-10-CM | POA: Diagnosis not present

## 2020-12-21 DIAGNOSIS — N186 End stage renal disease: Secondary | ICD-10-CM | POA: Diagnosis not present

## 2020-12-24 DIAGNOSIS — Z992 Dependence on renal dialysis: Secondary | ICD-10-CM | POA: Diagnosis not present

## 2020-12-24 DIAGNOSIS — E119 Type 2 diabetes mellitus without complications: Secondary | ICD-10-CM | POA: Diagnosis not present

## 2020-12-24 DIAGNOSIS — N2581 Secondary hyperparathyroidism of renal origin: Secondary | ICD-10-CM | POA: Diagnosis not present

## 2020-12-24 DIAGNOSIS — D509 Iron deficiency anemia, unspecified: Secondary | ICD-10-CM | POA: Diagnosis not present

## 2020-12-24 DIAGNOSIS — N186 End stage renal disease: Secondary | ICD-10-CM | POA: Diagnosis not present

## 2020-12-25 ENCOUNTER — Ambulatory Visit (INDEPENDENT_AMBULATORY_CARE_PROVIDER_SITE_OTHER): Payer: Medicare Other | Admitting: Physician Assistant

## 2020-12-25 ENCOUNTER — Encounter: Payer: Self-pay | Admitting: Physician Assistant

## 2020-12-25 DIAGNOSIS — N186 End stage renal disease: Secondary | ICD-10-CM | POA: Diagnosis not present

## 2020-12-25 DIAGNOSIS — I1311 Hypertensive heart and chronic kidney disease without heart failure, with stage 5 chronic kidney disease, or end stage renal disease: Secondary | ICD-10-CM | POA: Diagnosis not present

## 2020-12-25 DIAGNOSIS — Z89411 Acquired absence of right great toe: Secondary | ICD-10-CM | POA: Diagnosis not present

## 2020-12-25 DIAGNOSIS — E1122 Type 2 diabetes mellitus with diabetic chronic kidney disease: Secondary | ICD-10-CM | POA: Diagnosis not present

## 2020-12-25 DIAGNOSIS — E118 Type 2 diabetes mellitus with unspecified complications: Secondary | ICD-10-CM

## 2020-12-25 DIAGNOSIS — E1169 Type 2 diabetes mellitus with other specified complication: Secondary | ICD-10-CM | POA: Diagnosis not present

## 2020-12-25 DIAGNOSIS — I152 Hypertension secondary to endocrine disorders: Secondary | ICD-10-CM | POA: Diagnosis not present

## 2020-12-25 DIAGNOSIS — J9601 Acute respiratory failure with hypoxia: Secondary | ICD-10-CM | POA: Diagnosis not present

## 2020-12-25 DIAGNOSIS — D631 Anemia in chronic kidney disease: Secondary | ICD-10-CM | POA: Diagnosis not present

## 2020-12-25 DIAGNOSIS — E1143 Type 2 diabetes mellitus with diabetic autonomic (poly)neuropathy: Secondary | ICD-10-CM | POA: Diagnosis not present

## 2020-12-25 NOTE — Progress Notes (Signed)
Office Visit Note   Patient: [name]           Date of Birth: 01/02/1941           MRN: [id number] Visit Date: 12/25/2020              Requested by: [name], [address] [place],  [place] [address] PCP: [name], MD  Chief Complaint  Patient presents with   Right Foot - Nail Problem   Left Foot - Nail Problem      HPI: Patient presents today for nail trimming on her left foot.  She has a history of onychomycotic nails.  No other difficulties today.  Assessment & Plan: Visit Diagnoses: No diagnosis found.  Plan: Onychomycotic nails trimmed x5 follow-up in 3 months  Follow-Up Instructions: No follow-ups on file.   Ortho Exam  Patient is alert, oriented, no adenopathy, well-dressed, normal affect, normal respiratory effort. Bilateral feet are in good condition no ulcers no redness no cellulitis no swelling status post toe amputation on the right.  No appreciable toenails that need to be sure it trimmed on the left onychomycotic nails no cellulitis no findings of infection on either foot  Imaging: No results found. No images are attached to the encounter.  Labs: Lab Results  Component Value Date   HGBA1C 9.4 (H) 10/31/2020   HGBA1C 7.7 (H) 01/05/2020   HGBA1C 7.5 (H) 10/14/2017   ESRSEDRATE 42 (H) 01/06/2020   CRP 20.3 (H) 01/06/2020   REPTSTATUS 11/07/2020 FINAL 11/02/2020   CULT  11/02/2020    NO GROWTH 5 DAYS Performed at [facility], [place] [address] [place], [place] [address]      Lab Results  Component Value Date   ALBUMIN 2.9 (L) 11/08/2020   ALBUMIN 3.1 (L) 11/07/2020   ALBUMIN 3.0 (L) 11/06/2020    Lab Results  Component Value Date   MG 1.9 06/26/2013   MG 1.7 01/25/2009   No results found for: VD25OH  No results found for: PREALBUMIN CBC EXTENDED Latest Ref Rng & Units 11/09/2020 11/08/2020 11/07/2020  WBC 4.0 - 10.5 K/uL 10.6(H) 10.5 15.5(H)  RBC 3.87 - 5.11 MIL/uL 3.71(L) 3.73(L) 3.90  HGB 12.0 - 15.0 g/dL  11.6(L) 11.6(L) 12.2  HCT 36.0 - 46.0 % 35.2(L) 34.4(L) 35.6(L)  PLT 150 - 400 K/uL 152 155 160  NEUTROABS 1.7 - 7.7 K/uL - 6.1 10.7(H)  LYMPHSABS 0.7 - 4.0 K/uL - 2.3 2.2     There is no height or weight on file to calculate BMI.  Orders:  No orders of the defined types were placed in this encounter.  No orders of the defined types were placed in this encounter.    Procedures: No procedures performed  Clinical Data: No additional findings.  ROS:  All other systems negative, except as noted in the HPI. Review of Systems  Objective: Vital Signs: There were no vitals taken for this visit.  Specialty Comments:  No specialty comments available.  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 ([place]) 11/09/2020   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 ([place]) 11/09/2020   Chronic constipation 11/09/2020   Vitamin B 12 deficiency 11/09/2020   Acute respiratory failure with hypoxia (HCC) 10/26/2020   Elevated troponin 10/26/2020   Volume overload 10/26/2020   Hypertensive urgency 10/26/2020   History of CVA (cerebrovascular accident) 01/05/2020   Thrombocytopenia ([place]) 01/05/2020   History of colonic polyps 04/01/2018   Acute bronchitis 10/15/2017   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 ([place])    HCAP (healthcare-associated pneumonia) 05/29/2015   Hyperkalemia 05/29/2015   ESRD (end stage renal  disease) on dialysis [facility])    Encounter for adequacy testing for hemodialysis ([place]) 01/17/2014   Mechanical complication of other vascular device, implant, and graft 01/17/2014   Disturbances of vision, late effect of stroke 11/03/2013   Right hemiparesis ([place]) 08/19/2013   CVA (cerebral infarction) 07/20/2013   Gastroparesis due to DM ([place]) 07/01/2013   HLD (hyperlipidemia) 05/20/2006   Hereditary and idiopathic peripheral neuropathy 05/20/2006   Hypertension associated with diabetes ([place]) 05/20/2006   Past Medical History:  Diagnosis Date   Anemia    Cataract    Chronic kidney disease    CVA (cerebral infarction)    Diabetes mellitus with ESRD (end-stage renal disease) ([place])    Type 2   Dialysis patient ([place])    M, W, F   Fistula    R arm   GERD (gastroesophageal reflux disease)    Hypertension    Renal disorder    Shortness of breath    Stroke ([place])    right side weakness    Family History  Problem Relation Age of Onset   Cancer Sister    Cancer Brother    Anesthesia problems Neg Hx    Hypotension Neg Hx    Malignant hyperthermia Neg Hx    Pseudochol deficiency Neg Hx     Past Surgical History:  Procedure Laterality Date   AMPUTATION Right 01/13/2020   Procedure: AMPUTATION RIGHT FOOT 1ST RAY;  Surgeon: Newt Minion, MD;  Location: [facility];  Service: Orthopedics;  Laterality: Right;   AV FISTULA PLACEMENT Right 09/08/2013   Procedure: CREATION OF RIGHT BRACHIAL CEPHALIC ARTERIOVENOUS FISTULA ;  Surgeon: Mal Misty, MD;  Location: [place];  Service: Vascular;  Laterality: Right;   [place] Right 01/26/2014   Procedure: Right Arm BASILIC VEIN TRANSPOSITION;  Surgeon: Mal Misty, MD;  Location: [facility];  Service: Vascular;  Laterality: Right;   CATARACT EXTRACTION W/PHACO  11/20/2011   Procedure: CATARACT EXTRACTION PHACO AND INTRAOCULAR LENS PLACEMENT ([place]);  Surgeon: Tonny Branch, MD;  Location: [facility];  Service: Ophthalmology;  Laterality: Right;   CDE 18.82   CATARACT EXTRACTION W/PHACO Left 11/18/2012   Procedure: CATARACT EXTRACTION PHACO AND INTRAOCULAR LENS PLACEMENT (IOC);  Surgeon: Tonny Branch, MD;  Location: [facility];  Service: Ophthalmology;  Laterality: Left;  CDE: 18.97   COLONOSCOPY N/A 02/09/2013   Procedure: COLONOSCOPY;  Surgeon: Rogene Houston, MD;  [facility];  Service: Endoscopy;  Laterality: N/A;  305-moved to 220 Ann to notify pt   COLONOSCOPY N/A 07/08/2018   Procedure: COLONOSCOPY;  Surgeon: Rogene Houston, MD;  [facility];  Service: Endoscopy;  Laterality: N/A;  930   INSERTION OF DIALYSIS CATHETER Right 06/24/2013   Procedure: INSERTION OF DIALYSIS CATHETER: Ultrasound guided;  Surgeon: Durene Fruits  Pierre Bali, MD;  Location: [facility];  Service: Vascular;  Laterality: Right;   LIGATION OF ARTERIOVENOUS  FISTULA Right 01/26/2014   Procedure: LIGATION OF ARTERIOVENOUS  FISTULA;  Surgeon: Mal Misty, MD;  Location: [place];  Service: Vascular;  Laterality: Right;   LOOP RECORDER IMPLANT N/A 07/21/2013   Procedure: LOOP RECORDER IMPLANT;  Surgeon: Evans Lance, MD;  Location[facility] C[facility] LAB;  Service: Cardiovascular;  Laterality: N/A;   POLYPECTOMY  07/08/2018   Procedure: POLYPECTOMY;  Surgeon: Rogene Houston, MD;  [facility];  Service: Endoscopy;;  Descending colon polyps x 2    TEE WITHOUT CARDIOVERSION N/A 07/21/2013   Procedure: TRANSESOPHAGEAL ECHOCARDIOGRAM (TEE);  Surgeon: Dorothy Spark, MD;  [facility] ENDOSCOPY;  Service: Cardiovascular;  Laterality: N/A;   Social History   Occupational History   Not on file  Tobacco Use   Smoking status: Never   Smokeless tobacco: Never  Vaping Use   Vaping Use: Never used  Substance and Sexual Activity   Alcohol use: No   Drug use: No   Sexual activity: Not on file

## 2020-12-26 DIAGNOSIS — N186 End stage renal disease: Secondary | ICD-10-CM | POA: Diagnosis not present

## 2020-12-26 DIAGNOSIS — Z992 Dependence on renal dialysis: Secondary | ICD-10-CM | POA: Diagnosis not present

## 2020-12-28 DIAGNOSIS — Z992 Dependence on renal dialysis: Secondary | ICD-10-CM | POA: Diagnosis not present

## 2020-12-28 DIAGNOSIS — N186 End stage renal disease: Secondary | ICD-10-CM | POA: Diagnosis not present

## 2020-12-31 DIAGNOSIS — Z992 Dependence on renal dialysis: Secondary | ICD-10-CM | POA: Diagnosis not present

## 2020-12-31 DIAGNOSIS — N186 End stage renal disease: Secondary | ICD-10-CM | POA: Diagnosis not present

## 2021-01-01 DIAGNOSIS — Z89411 Acquired absence of right great toe: Secondary | ICD-10-CM | POA: Diagnosis not present

## 2021-01-01 DIAGNOSIS — I152 Hypertension secondary to endocrine disorders: Secondary | ICD-10-CM | POA: Diagnosis not present

## 2021-01-01 DIAGNOSIS — E1143 Type 2 diabetes mellitus with diabetic autonomic (poly)neuropathy: Secondary | ICD-10-CM | POA: Diagnosis not present

## 2021-01-01 DIAGNOSIS — J9601 Acute respiratory failure with hypoxia: Secondary | ICD-10-CM | POA: Diagnosis not present

## 2021-01-01 DIAGNOSIS — D631 Anemia in chronic kidney disease: Secondary | ICD-10-CM | POA: Diagnosis not present

## 2021-01-01 DIAGNOSIS — E1122 Type 2 diabetes mellitus with diabetic chronic kidney disease: Secondary | ICD-10-CM | POA: Diagnosis not present

## 2021-01-01 DIAGNOSIS — E1169 Type 2 diabetes mellitus with other specified complication: Secondary | ICD-10-CM | POA: Diagnosis not present

## 2021-01-01 DIAGNOSIS — I1311 Hypertensive heart and chronic kidney disease without heart failure, with stage 5 chronic kidney disease, or end stage renal disease: Secondary | ICD-10-CM | POA: Diagnosis not present

## 2021-01-01 DIAGNOSIS — N186 End stage renal disease: Secondary | ICD-10-CM | POA: Diagnosis not present

## 2021-01-02 DIAGNOSIS — N186 End stage renal disease: Secondary | ICD-10-CM | POA: Diagnosis not present

## 2021-01-02 DIAGNOSIS — Z992 Dependence on renal dialysis: Secondary | ICD-10-CM | POA: Diagnosis not present

## 2021-01-04 DIAGNOSIS — N186 End stage renal disease: Secondary | ICD-10-CM | POA: Diagnosis not present

## 2021-01-04 DIAGNOSIS — Z992 Dependence on renal dialysis: Secondary | ICD-10-CM | POA: Diagnosis not present

## 2021-01-07 DIAGNOSIS — N2581 Secondary hyperparathyroidism of renal origin: Secondary | ICD-10-CM | POA: Diagnosis not present

## 2021-01-07 DIAGNOSIS — Z992 Dependence on renal dialysis: Secondary | ICD-10-CM | POA: Diagnosis not present

## 2021-01-07 DIAGNOSIS — N186 End stage renal disease: Secondary | ICD-10-CM | POA: Diagnosis not present

## 2021-01-08 DIAGNOSIS — Z23 Encounter for immunization: Secondary | ICD-10-CM | POA: Diagnosis not present

## 2021-01-08 DIAGNOSIS — E538 Deficiency of other specified B group vitamins: Secondary | ICD-10-CM | POA: Diagnosis not present

## 2021-01-09 DIAGNOSIS — Z992 Dependence on renal dialysis: Secondary | ICD-10-CM | POA: Diagnosis not present

## 2021-01-09 DIAGNOSIS — N186 End stage renal disease: Secondary | ICD-10-CM | POA: Diagnosis not present

## 2021-01-10 DIAGNOSIS — E1143 Type 2 diabetes mellitus with diabetic autonomic (poly)neuropathy: Secondary | ICD-10-CM | POA: Diagnosis not present

## 2021-01-10 DIAGNOSIS — I152 Hypertension secondary to endocrine disorders: Secondary | ICD-10-CM | POA: Diagnosis not present

## 2021-01-10 DIAGNOSIS — D631 Anemia in chronic kidney disease: Secondary | ICD-10-CM | POA: Diagnosis not present

## 2021-01-10 DIAGNOSIS — I1311 Hypertensive heart and chronic kidney disease without heart failure, with stage 5 chronic kidney disease, or end stage renal disease: Secondary | ICD-10-CM | POA: Diagnosis not present

## 2021-01-10 DIAGNOSIS — E1169 Type 2 diabetes mellitus with other specified complication: Secondary | ICD-10-CM | POA: Diagnosis not present

## 2021-01-10 DIAGNOSIS — J9601 Acute respiratory failure with hypoxia: Secondary | ICD-10-CM | POA: Diagnosis not present

## 2021-01-10 DIAGNOSIS — Z89411 Acquired absence of right great toe: Secondary | ICD-10-CM | POA: Diagnosis not present

## 2021-01-10 DIAGNOSIS — E1122 Type 2 diabetes mellitus with diabetic chronic kidney disease: Secondary | ICD-10-CM | POA: Diagnosis not present

## 2021-01-10 DIAGNOSIS — N186 End stage renal disease: Secondary | ICD-10-CM | POA: Diagnosis not present

## 2021-01-11 DIAGNOSIS — N186 End stage renal disease: Secondary | ICD-10-CM | POA: Diagnosis not present

## 2021-01-11 DIAGNOSIS — Z992 Dependence on renal dialysis: Secondary | ICD-10-CM | POA: Diagnosis not present

## 2021-01-13 DIAGNOSIS — N186 End stage renal disease: Secondary | ICD-10-CM | POA: Diagnosis not present

## 2021-01-13 DIAGNOSIS — Z992 Dependence on renal dialysis: Secondary | ICD-10-CM | POA: Diagnosis not present

## 2021-01-14 DIAGNOSIS — N186 End stage renal disease: Secondary | ICD-10-CM | POA: Diagnosis not present

## 2021-01-14 DIAGNOSIS — Z992 Dependence on renal dialysis: Secondary | ICD-10-CM | POA: Diagnosis not present

## 2021-01-15 DIAGNOSIS — N186 End stage renal disease: Secondary | ICD-10-CM | POA: Diagnosis not present

## 2021-01-15 DIAGNOSIS — Z89411 Acquired absence of right great toe: Secondary | ICD-10-CM | POA: Diagnosis not present

## 2021-01-15 DIAGNOSIS — J9601 Acute respiratory failure with hypoxia: Secondary | ICD-10-CM | POA: Diagnosis not present

## 2021-01-15 DIAGNOSIS — E1169 Type 2 diabetes mellitus with other specified complication: Secondary | ICD-10-CM | POA: Diagnosis not present

## 2021-01-15 DIAGNOSIS — I152 Hypertension secondary to endocrine disorders: Secondary | ICD-10-CM | POA: Diagnosis not present

## 2021-01-15 DIAGNOSIS — D631 Anemia in chronic kidney disease: Secondary | ICD-10-CM | POA: Diagnosis not present

## 2021-01-15 DIAGNOSIS — I1311 Hypertensive heart and chronic kidney disease without heart failure, with stage 5 chronic kidney disease, or end stage renal disease: Secondary | ICD-10-CM | POA: Diagnosis not present

## 2021-01-15 DIAGNOSIS — E1143 Type 2 diabetes mellitus with diabetic autonomic (poly)neuropathy: Secondary | ICD-10-CM | POA: Diagnosis not present

## 2021-01-15 DIAGNOSIS — E1122 Type 2 diabetes mellitus with diabetic chronic kidney disease: Secondary | ICD-10-CM | POA: Diagnosis not present

## 2021-01-16 DIAGNOSIS — Z992 Dependence on renal dialysis: Secondary | ICD-10-CM | POA: Diagnosis not present

## 2021-01-16 DIAGNOSIS — N186 End stage renal disease: Secondary | ICD-10-CM | POA: Diagnosis not present

## 2021-01-18 DIAGNOSIS — N186 End stage renal disease: Secondary | ICD-10-CM | POA: Diagnosis not present

## 2021-01-18 DIAGNOSIS — Z992 Dependence on renal dialysis: Secondary | ICD-10-CM | POA: Diagnosis not present

## 2021-01-21 DIAGNOSIS — N2581 Secondary hyperparathyroidism of renal origin: Secondary | ICD-10-CM | POA: Diagnosis not present

## 2021-01-21 DIAGNOSIS — Z992 Dependence on renal dialysis: Secondary | ICD-10-CM | POA: Diagnosis not present

## 2021-01-21 DIAGNOSIS — N186 End stage renal disease: Secondary | ICD-10-CM | POA: Diagnosis not present

## 2021-01-21 DIAGNOSIS — D509 Iron deficiency anemia, unspecified: Secondary | ICD-10-CM | POA: Diagnosis not present

## 2021-01-23 DIAGNOSIS — Z992 Dependence on renal dialysis: Secondary | ICD-10-CM | POA: Diagnosis not present

## 2021-01-23 DIAGNOSIS — N186 End stage renal disease: Secondary | ICD-10-CM | POA: Diagnosis not present

## 2021-01-25 DIAGNOSIS — N186 End stage renal disease: Secondary | ICD-10-CM | POA: Diagnosis not present

## 2021-01-25 DIAGNOSIS — Z992 Dependence on renal dialysis: Secondary | ICD-10-CM | POA: Diagnosis not present

## 2021-01-28 DIAGNOSIS — Z992 Dependence on renal dialysis: Secondary | ICD-10-CM | POA: Diagnosis not present

## 2021-01-28 DIAGNOSIS — N186 End stage renal disease: Secondary | ICD-10-CM | POA: Diagnosis not present

## 2021-01-30 DIAGNOSIS — Z992 Dependence on renal dialysis: Secondary | ICD-10-CM | POA: Diagnosis not present

## 2021-01-30 DIAGNOSIS — N186 End stage renal disease: Secondary | ICD-10-CM | POA: Diagnosis not present

## 2021-02-01 DIAGNOSIS — N186 End stage renal disease: Secondary | ICD-10-CM | POA: Diagnosis not present

## 2021-02-01 DIAGNOSIS — Z992 Dependence on renal dialysis: Secondary | ICD-10-CM | POA: Diagnosis not present

## 2021-02-04 DIAGNOSIS — Z992 Dependence on renal dialysis: Secondary | ICD-10-CM | POA: Diagnosis not present

## 2021-02-04 DIAGNOSIS — N186 End stage renal disease: Secondary | ICD-10-CM | POA: Diagnosis not present

## 2021-02-06 DIAGNOSIS — N186 End stage renal disease: Secondary | ICD-10-CM | POA: Diagnosis not present

## 2021-02-06 DIAGNOSIS — Z992 Dependence on renal dialysis: Secondary | ICD-10-CM | POA: Diagnosis not present

## 2021-02-08 DIAGNOSIS — N186 End stage renal disease: Secondary | ICD-10-CM | POA: Diagnosis not present

## 2021-02-08 DIAGNOSIS — Z992 Dependence on renal dialysis: Secondary | ICD-10-CM | POA: Diagnosis not present

## 2021-02-11 DIAGNOSIS — N186 End stage renal disease: Secondary | ICD-10-CM | POA: Diagnosis not present

## 2021-02-11 DIAGNOSIS — Z992 Dependence on renal dialysis: Secondary | ICD-10-CM | POA: Diagnosis not present

## 2021-02-13 DIAGNOSIS — E1122 Type 2 diabetes mellitus with diabetic chronic kidney disease: Secondary | ICD-10-CM | POA: Diagnosis not present

## 2021-02-13 DIAGNOSIS — E519 Thiamine deficiency, unspecified: Secondary | ICD-10-CM | POA: Diagnosis not present

## 2021-02-13 DIAGNOSIS — I120 Hypertensive chronic kidney disease with stage 5 chronic kidney disease or end stage renal disease: Secondary | ICD-10-CM | POA: Diagnosis not present

## 2021-02-13 DIAGNOSIS — Z992 Dependence on renal dialysis: Secondary | ICD-10-CM | POA: Diagnosis not present

## 2021-02-13 DIAGNOSIS — N186 End stage renal disease: Secondary | ICD-10-CM | POA: Diagnosis not present

## 2021-02-15 DIAGNOSIS — Z992 Dependence on renal dialysis: Secondary | ICD-10-CM | POA: Diagnosis not present

## 2021-02-15 DIAGNOSIS — N186 End stage renal disease: Secondary | ICD-10-CM | POA: Diagnosis not present

## 2021-02-18 DIAGNOSIS — Z992 Dependence on renal dialysis: Secondary | ICD-10-CM | POA: Diagnosis not present

## 2021-02-18 DIAGNOSIS — N186 End stage renal disease: Secondary | ICD-10-CM | POA: Diagnosis not present

## 2021-02-20 DIAGNOSIS — Z992 Dependence on renal dialysis: Secondary | ICD-10-CM | POA: Diagnosis not present

## 2021-02-20 DIAGNOSIS — N186 End stage renal disease: Secondary | ICD-10-CM | POA: Diagnosis not present

## 2021-02-22 DIAGNOSIS — N186 End stage renal disease: Secondary | ICD-10-CM | POA: Diagnosis not present

## 2021-02-22 DIAGNOSIS — Z992 Dependence on renal dialysis: Secondary | ICD-10-CM | POA: Diagnosis not present

## 2021-02-25 DIAGNOSIS — N186 End stage renal disease: Secondary | ICD-10-CM | POA: Diagnosis not present

## 2021-02-25 DIAGNOSIS — D509 Iron deficiency anemia, unspecified: Secondary | ICD-10-CM | POA: Diagnosis not present

## 2021-02-25 DIAGNOSIS — N2581 Secondary hyperparathyroidism of renal origin: Secondary | ICD-10-CM | POA: Diagnosis not present

## 2021-02-25 DIAGNOSIS — Z992 Dependence on renal dialysis: Secondary | ICD-10-CM | POA: Diagnosis not present

## 2021-02-27 DIAGNOSIS — Z992 Dependence on renal dialysis: Secondary | ICD-10-CM | POA: Diagnosis not present

## 2021-02-27 DIAGNOSIS — N186 End stage renal disease: Secondary | ICD-10-CM | POA: Diagnosis not present

## 2021-03-01 DIAGNOSIS — Z992 Dependence on renal dialysis: Secondary | ICD-10-CM | POA: Diagnosis not present

## 2021-03-01 DIAGNOSIS — N186 End stage renal disease: Secondary | ICD-10-CM | POA: Diagnosis not present

## 2021-03-04 ENCOUNTER — Ambulatory Visit
Admission: EM | Admit: 2021-03-04 | Discharge: 2021-03-04 | Disposition: A | Discharge status: Home / Self Care | Payer: Medicare Other | Attending: Family Medicine | Admitting: Family Medicine

## 2021-03-04 ENCOUNTER — Ambulatory Visit (INDEPENDENT_AMBULATORY_CARE_PROVIDER_SITE_OTHER): Payer: Medicare Other

## 2021-03-04 ENCOUNTER — Encounter (HOSPITAL_COMMUNITY): Payer: Self-pay | Admitting: *Deleted

## 2021-03-04 ENCOUNTER — Other Ambulatory Visit: Payer: Self-pay

## 2021-03-04 ENCOUNTER — Ambulatory Visit: Payer: Self-pay

## 2021-03-04 ENCOUNTER — Emergency Department (HOSPITAL_COMMUNITY): Payer: Medicare Other

## 2021-03-04 ENCOUNTER — Emergency Department (HOSPITAL_COMMUNITY)
Admission: EM | Admit: 2021-03-04 | Discharge: 2021-03-04 | Disposition: A | Discharge status: Home / Self Care | Payer: Medicare Other | Attending: Emergency Medicine | Admitting: Emergency Medicine

## 2021-03-04 DIAGNOSIS — Z794 Long term (current) use of insulin: Secondary | ICD-10-CM | POA: Insufficient documentation

## 2021-03-04 DIAGNOSIS — Z7982 Long term (current) use of aspirin: Secondary | ICD-10-CM | POA: Diagnosis not present

## 2021-03-04 DIAGNOSIS — E1140 Type 2 diabetes mellitus with diabetic neuropathy, unspecified: Secondary | ICD-10-CM | POA: Diagnosis not present

## 2021-03-04 DIAGNOSIS — N186 End stage renal disease: Secondary | ICD-10-CM | POA: Insufficient documentation

## 2021-03-04 DIAGNOSIS — M25552 Pain in left hip: Secondary | ICD-10-CM | POA: Diagnosis not present

## 2021-03-04 DIAGNOSIS — S32592A Other specified fracture of left pubis, initial encounter for closed fracture: Secondary | ICD-10-CM

## 2021-03-04 DIAGNOSIS — S32512A Fracture of superior rim of left pubis, initial encounter for closed fracture: Secondary | ICD-10-CM | POA: Diagnosis not present

## 2021-03-04 DIAGNOSIS — W19XXXA Unspecified fall, initial encounter: Secondary | ICD-10-CM | POA: Insufficient documentation

## 2021-03-04 DIAGNOSIS — S32502A Unspecified fracture of left pubis, initial encounter for closed fracture: Secondary | ICD-10-CM | POA: Diagnosis not present

## 2021-03-04 DIAGNOSIS — Z992 Dependence on renal dialysis: Secondary | ICD-10-CM | POA: Diagnosis not present

## 2021-03-04 DIAGNOSIS — M1612 Unilateral primary osteoarthritis, left hip: Secondary | ICD-10-CM | POA: Diagnosis not present

## 2021-03-04 DIAGNOSIS — S3994XA Unspecified injury of external genitals, initial encounter: Secondary | ICD-10-CM | POA: Diagnosis present

## 2021-03-04 DIAGNOSIS — E1122 Type 2 diabetes mellitus with diabetic chronic kidney disease: Secondary | ICD-10-CM | POA: Insufficient documentation

## 2021-03-04 DIAGNOSIS — I120 Hypertensive chronic kidney disease with stage 5 chronic kidney disease or end stage renal disease: Secondary | ICD-10-CM | POA: Insufficient documentation

## 2021-03-04 NOTE — ED Triage Notes (Signed)
Referred from urgent care for ct of pelvis

## 2021-03-04 NOTE — ED Notes (Signed)
Patient transported to CT 

## 2021-03-04 NOTE — Discharge Instructions (Signed)
Use walker.  Schedule to see the Orthopaedist for evaluation.

## 2021-03-04 NOTE — ED Provider Notes (Signed)
[facility] Provider Note   CSN: [id number] Arrival date & time: 03/04/21  1718     History No chief complaint on file.   [name] is a 80 y.o. female.  Pt complains of pain in her left hip and left pelvis area.  Pt reports she fell.  Family member reports fall was over a month ago.  Pt has been using a walker.  Pt went to urgent care today and had an xray which showed a pelvic fracture.  Provider sent pt here for a Ct scan because of radiologist recommendations.  Pt complains of pain with walking  The history is provided by the patient. No language interpreter was used.      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 ([place]) 11/09/2020   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 diabetes mellitus ([place]) 11/09/2020   Dependence on renal dialysis ([place]) 11/09/2020   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 disease ([place]) 11/09/2020   Hyperlipidemia associated with type 2 diabetes mellitus ([place]) 11/09/2020   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 ([place]) 11/09/2020   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 ([place]) 11/09/2020   Chronic constipation 11/09/2020   Vitamin B 12 deficiency 11/09/2020   Acute respiratory failure with hypoxia ([place]) 10/26/2020   Elevated troponin 10/26/2020   Volume overload 10/26/2020   Hypertensive urgency 10/26/2020   History of CVA  (cerebrovascular accident) 01/05/2020   Thrombocytopenia ([place]) 01/05/2020   History of colonic polyps 04/01/2018   Acute bronchitis 10/15/2017   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 ([place])    HCAP (healthcare-associated pneumonia) 05/29/2015   Hyperkalemia 05/29/2015   ESRD (end stage renal disease) on dialysis [facility])    Encounter for adequacy testing for hemodialysis ([place]) 01/17/2014   Mechanical complication of other vascular device, implant, and graft 01/17/2014   Disturbances of vision, late effect of stroke 11/03/2013   Right hemiparesis ([place]) 08/19/2013   CVA (cerebral infarction) 07/20/2013   Gastroparesis due to DM ([place]) 07/01/2013   HLD (hyperlipidemia) 05/20/2006   Hereditary and idiopathic peripheral neuropathy 05/20/2006   Hypertension associated with diabetes ([place]) 05/20/2006    Past Surgical History:  Procedure Laterality Date   AMPUTATION Right 01/13/2020   Procedure: AMPUTATION RIGHT FOOT 1ST RAY;  Surgeon: Newt Minion, MD;  Location: [facility];  Service: Orthopedics;  Laterality: Right;   AV FISTULA PLACEMENT Right 09/08/2013   Procedure: CREATION OF RIGHT BRACHIAL CEPHALIC ARTERIOVENOUS FISTULA ;  Surgeon: Mal Misty, MD;  Location: [facility];  Service: Vascular;  Laterality: Right;   [place] Right 01/26/2014   Procedure: Right Arm BASILIC VEIN TRANSPOSITION;  Surgeon: Mal Misty, MD;  Location: [place] [facility];  Service: Vascular;  Laterality: Right;   CATARACT EXTRACTION W/PHACO  11/20/2011   Procedure: CATARACT EXTRACTION PHACO AND INTRAOCULAR LENS PLACEMENT (I[facility]);  Surgeon: Tonny Branch, MD;  [facility];  Service: Ophthalmology;  Laterality: Right;  CDE 18.82   CATARACT EXTRACTION W/PHACO Left 11/18/2012   Procedure: CATARACT EXTRACTION PHACO AND INTRAOCULAR LENS PLACEMENT (I[facility]);  Surgeon: Tonny Branch, MD;  Location: [facility];  Service: Ophthalmology;  Laterality: Left;  CDE: 18.97   COLONOSCOPY N/A 02/09/2013    Procedure: COLONOSCOPY;  Surgeon: Rogene Houston, MD;  [facility];  Service: Endoscopy;  Laterality: N/A;  305-moved to 220 Ann to notify pt   COLONOSCOPY N/A 07/08/2018   Procedure: COLONOSCOPY;  Surgeon: Rogene Houston, MD;  [facility];  Service: Endoscopy;  Laterality: N/A;  930   INSERTION OF DIALYSIS CATHETER Right 06/24/2013   Procedure: INSERTION OF DIALYSIS CATHETER: Ultrasound guided;  Surgeon: Serafina Mitchell, MD;  Location: [place];  Service: Vascular;  Laterality: Right;   LIGATION OF ARTERIOVENOUS  FISTULA Right 01/26/2014   Procedure: LIGATION OF ARTERIOVENOUS  FISTULA;  Surgeon: Mal Misty, MD;  Location: [place];  Service: Vascular;  Laterality: Right;   LOOP RECORDER IMPLANT N/A 07/21/2013   Procedure: LOOP RECORDER IMPLANT;  Surgeon: Evans Lance, MD;  Location: [facility] CATH LAB;  Service: Cardiovascular;  Laterality: N/A;   POLYPECTOMY  07/08/2018   Procedure: POLYPECTOMY;  Surgeon: Rogene Houston, MD;  [facility];  Service: Endoscopy;;  Descending colon polyps x 2    TEE WITHOUT CARDIOVERSION N/A 07/21/2013   Procedure: TRANSESOPHAGEAL ECHOCARDIOGRAM (TEE);  Surgeon: Dorothy Spark, MD;  Location: [facility] ENDOSCOPY;  Service: Cardiovascular;  Laterality: N/A;     OB History     Gravida  0   Para  0   Term  0   Preterm  0   AB  0   Living         SAB  0   IAB  0   Ectopic  0   Multiple      Live Births              Family History  Problem Relation Age of Onset   Cancer Sister    Cancer Brother    Anesthesia problems Neg Hx    Hypotension Neg Hx    Malignant hyperthermia Neg Hx    Pseudochol deficiency Neg Hx     Social History   Tobacco Use   Smoking status: Never   Smokeless tobacco: Never  Vaping Use   Vaping Use: Never used  Substance Use Topics   Alcohol use: No   Drug use: No    Home Medications Prior to Admission medications   Medication Sig Start Date End Date Taking? Authorizing Provider   albuterol (VENTOLIN HFA) 108 (90 Base) MCG/ACT inhaler Inhale 2 puffs into the lungs every 6 (six) hours as needed for wheezing or shortness of breath. 11/21/20  Yes Gerlene Fee, NP  aspirin 81 MG chewable tablet Chew 2 tablets (162 mg total) by mouth daily. 07/09/18  Yes Rehman, Mechele Dawley, MD  cyanocobalamin (,VITAMIN B-12,) 1000 MCG/ML injection Inject 1 mL into the muscle every 30 (thirty) days. 11/01/14  Yes [provider]  insulin NPH Human (NOVOLIN N) 100 UNIT/ML injection Inject 0.07 mLs (7 Units total) into the skin 2 (two) times daily before a meal. 11/21/20  Yes Gerlene Fee, NP  labetalol (NORMODYNE) 100 MG tablet  Take 1 tablet (100 mg total) by mouth 2 (two) times daily. Patient taking differently: Take 200 mg by mouth 2 (two) times daily. 11/21/20 03/04/21 Yes Gerlene Fee, NP  lidocaine-prilocaine (EMLA) cream Apply 1 application topically every Monday, Wednesday, and Friday. 11/21/20  Yes Gerlene Fee, NP  multivitamin (RENA-VIT) TABS tablet Take 1 tablet by mouth at bedtime. 08/21/14  Yes Kirsteins, Luanna Salk, MD  sevelamer carbonate (RENVELA) 800 MG tablet Take 1 tablet (800 mg total) by mouth 2 (two) times daily before lunch and supper. 11/21/20  Yes Gerlene Fee, NP  BD INSULIN SYRINGE U/F 31G X 5/16" 0.3 ML MISC Inject 1 each into the skin 2 (two) times daily. as directed 11/21/20   Gerlene Fee, NP  benzonatate (TESSALON) 100 MG capsule Take 1 capsule (100 mg total) by mouth 3 (three) times daily as needed for cough. Patient not taking: No sig reported 11/21/20   Gerlene Fee, NP  insulin lispro (HUMALOG) 100 UNIT/ML injection Inject 0.05 mLs (5 Units total) into the skin 3 (three) times daily before meals. For cbg >150 Patient not taking: No sig reported 11/21/20   Gerlene Fee, NP  NON FORMULARY Diet: NAS, Cons CHO    [provider]  NON FORMULARY Oxygen @ 2L/min via [place] PRN to keep sat above 90%  Special Instructions: Document O2 sat qshift. As  Needed; PRN 1, PRN 2, PRN 3, PRN 4, PRN 5, PRN 6 Patient not taking: No sig reported    [provider]  omeprazole (PRILOSEC) 20 MG capsule Take 1 capsule (20 mg total) by mouth daily. Patient not taking: Reported on 03/04/2021 03/24/20   Maudie Flakes, MD  pentoxifylline (TRENTAL) 400 MG CR tablet Take 1 tablet (400 mg total) by mouth daily with breakfast. Patient not taking: No sig reported 11/21/20   Gerlene Fee, NP  polyethylene glycol (MIRALAX / GLYCOLAX) 17 g packet Take 17 g by mouth 2 (two) times daily as needed for mild constipation. Patient not taking: No sig reported 11/08/20   Mercy Riding, MD  senna-docusate (SENOKOT-S) 8.6-50 MG tablet Take 1 tablet by mouth 2 (two) times daily as needed for moderate constipation. Patient not taking: No sig reported 11/08/20   Mercy Riding, MD    Allergies    Ambien [zolpidem] and Reglan [metoclopramide]  Review of Systems   Review of Systems  Musculoskeletal:  Positive for gait problem and myalgias.  All other systems reviewed and are negative.  Physical Exam Updated Vital Signs BP 128/76   Pulse 80   Temp 98.8 F (37.1 C)   Resp 16   SpO2 100%   Physical Exam Vitals and nursing note reviewed.  Constitutional:      Appearance: She is well-developed.  HENT:     Head: Normocephalic.  Eyes:     Pupils: Pupils are equal, round, and reactive to light.  Cardiovascular:     Rate and Rhythm: Normal rate and regular rhythm.     Pulses: Normal pulses.  Pulmonary:     Effort: Pulmonary effort is normal.  Abdominal:     General: There is no distension.  Musculoskeletal:        General: Normal range of motion.     Cervical back: Normal range of motion.     Comments: Tender left hip and pelvis area,  pain with movement,  nv and ns intact  Skin:    General: Skin is warm.  Neurological:  Mental Status: She is alert and oriented to person, place, and time.  Psychiatric:        Mood and Affect: Mood normal.    ED  Results / Procedures / Treatments   Labs (all labs ordered are listed, but only abnormal results are displayed) Labs Reviewed - No data to display  EKG None  Radiology CT Hip Left Wo Contrast  Result Date: 03/04/2021 CLINICAL DATA:  Left hip pain since fall 3 weeks ago. EXAM: CT OF THE LEFT HIP WITHOUT CONTRAST TECHNIQUE: Multidetector CT imaging of the left hip was performed according to the standard protocol. Multiplanar CT image reconstructions were also generated. COMPARISON:  Left hip x-rays from same day. CT abdomen pelvis dated March 23, 2020. FINDINGS: Bones/Joint/Cartilage Acute nondisplaced fracture of the left parasymphyseal superior pubic ramus (series 6, image 45). No additional fracture. No dislocation. Mild left hip osteoarthritis. No joint effusion. Ligaments Ligaments are suboptimally evaluated by CT. Muscles and Tendons Grossly intact. Soft tissue No fluid collection or hematoma.  No soft tissue mass. IMPRESSION: 1. Acute nondisplaced fracture of the left parasymphyseal superior pubic ramus. 2. Mild left hip osteoarthritis. Electronically Signed   By: Titus Dubin M.D.   On: 03/04/2021 20:15   DG Hip Unilat With Pelvis 2-3 Views Left  Result Date: 03/04/2021 CLINICAL DATA:  Left hip pain following fall 1 month ago, initial encounter EXAM: DG HIP (WITH OR WITHOUT PELVIS) 3V LEFT COMPARISON:  None. FINDINGS: There is minimal irregularity and sclerosis along the superior pubic ramus on the left suggestive of undisplaced fracture. This is best visualized on the frontal film. Degenerative changes of the hip joints are noted bilaterally. No acute fracture or dislocation involving the proximal femur is seen. No soft tissue abnormality is noted. IMPRESSION: Findings suspicious for undisplaced fracture of the left superior pubic ramus. CT may be helpful for further evaluation as clinically indicated. Electronically Signed   By: Inez Catalina M.D.   On: 03/04/2021 16:31     Procedures Procedures   Medications Ordered in ED Medications - No data to display  ED Course  I have reviewed the triage vital signs and the nursing notes.  Pertinent labs & imaging results that were available during my care of the patient were reviewed by me and considered in my medical decision making (see chart for details).    MDM Rules/Calculators/A&P                           MDM:  Xray from urgent care reviewed,  Ct scan shows shows superior pubic ramus fracture  Final Clinical Impression(s) / ED Diagnoses Final diagnoses:  Closed fracture of ramus of left pubis, initial encounter ([place])    Rx / DC Orders ED Discharge Orders     None     Pt advised tylenol for pain.  Follow up with Orthopaedist for evaluation  An After Visit Summary was printed and given to the patient.    Sidney Ace 03/04/21 2300    Luna Fuse, MD 03/20/21 [phone]

## 2021-03-04 NOTE — ED Provider Notes (Signed)
[facility]    CSN: [id number] Arrival date & time: 03/04/21  1449      History   Chief Complaint Chief Complaint  Patient presents with   Hip Pain    HPI [name] is a 80 y.o. female.   Patient presenting today with left hip and medial groin pain for about a month now.  She attributes it to a fall she had about a month ago where she fell directly onto her knees and had pain shoot up into the hip region.  She states she did not have much issue immediately after the fall but since has been having worsening developing pain in these areas.  She used to walk with a cane but she now has to have significant assistance to walk due to the pain with ambulation and weightbearing.  Denies numbness, tingling, radiation of pain down the leg, bowel or bladder incontinence, pain and swelling in the knee or the ankle.  Has been taking Tylenol with mild temporary relief but nothing lasting.   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 ([place]) 11/09/2020   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 diabetes mellitus ([place]) 11/09/2020   Dependence on renal dialysis ([place]) 11/09/2020   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 disease ([place]) 11/09/2020   Hyperlipidemia associated with type 2 diabetes mellitus ([place]) 11/09/2020   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 ([place]) 11/09/2020   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 ([place]) 11/09/2020   Chronic constipation 11/09/2020   Vitamin B 12 deficiency 11/09/2020   Acute respiratory failure with hypoxia ([place]) 10/26/2020   Elevated troponin 10/26/2020   Volume overload 10/26/2020   Hypertensive urgency 10/26/2020   History of CVA (cerebrovascular accident) 01/05/2020   Thrombocytopenia ([place]) 01/05/2020   History of colonic polyps 04/01/2018   Acute bronchitis 10/15/2017   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 ([place])    HCAP (healthcare-associated pneumonia) 05/29/2015   Hyperkalemia 05/29/2015   ESRD (end stage renal disease) on dialysis [facility])    Encounter for adequacy testing for hemodialysis ([place]) 01/17/2014   Mechanical complication of other vascular device, implant, and graft 01/17/2014   Disturbances of vision, late effect of stroke 11/03/2013   Right hemiparesis ([place]) 08/19/2013   CVA (cerebral infarction) 07/20/2013   Gastroparesis due to DM ([place]) 07/01/2013   HLD (hyperlipidemia) 05/20/2006   Hereditary and idiopathic peripheral neuropathy 05/20/2006   Hypertension associated with diabetes ([place]) 05/20/2006    Past Surgical History:  Procedure Laterality Date   AMPUTATION Right 01/13/2020   Procedure: AMPUTATION RIGHT FOOT 1ST RAY;  Surgeon: Newt Minion, MD;  Location: [place];  Service: Orthopedics;  Laterality: Right;   AV FISTULA PLACEMENT Right  09/08/2013   Procedure: CREATION OF RIGHT BRACHIAL CEPHALIC ARTERIOVENOUS FISTULA ;  Surgeon: Mal Misty, MD;  Location: [place];  Service: Vascular;  Laterality: Right;   [place] Right 01/26/2014   Procedure: Right Arm BASILIC VEIN TRANSPOSITION;  Surgeon: Mal Misty, MD;  Location: [place];  Service: Vascular;  Laterality: Right;   CATARACT EXTRACTION W/PHACO  11/20/2011   Procedure: CATARACT EXTRACTION PHACO AND INTRAOCULAR LENS PLACEMENT ([place]);  Surgeon: Tonny Branch, MD;  Location: [facility];  Service: Ophthalmology;   Laterality: Right;  CDE 18.82   CATARACT EXTRACTION W/PHACO Left 11/18/2012   Procedure: CATARACT EXTRACTION PHACO AND INTRAOCULAR LENS PLACEMENT (I[facility]);  Surgeon: Tonny Branch, MD;  Location: [facility];  Service: Ophthalmology;  Laterality: Left;  CDE: 18.97   COLONOSCOPY N/A 02/09/2013   Procedure: COLONOSCOPY;  Surgeon: Rogene Houston, MD;  [facility];  Service: Endoscopy;  Laterality: N/A;  305-moved to 220 Ann to notify pt   COLONOSCOPY N/A 07/08/2018   Procedure: COLONOSCOPY;  Surgeon: Rogene Houston, MD;  [facility];  Service: Endoscopy;  Laterality: N/A;  930   INSERTION OF DIALYSIS CATHETER Right 06/24/2013   Procedure: INSERTION OF DIALYSIS CATHETER: Ultrasound guided;  Surgeon: Serafina Mitchell, MD;  Location: [place];  Service: Vascular;  Laterality: Right;   LIGATION OF ARTERIOVENOUS  FISTULA Right 01/26/2014   Procedure: LIGATION OF ARTERIOVENOUS  FISTULA;  Surgeon: Mal Misty, MD;  Location: [place];  Service: Vascular;  Laterality: Right;   LOOP RECORDER IMPLANT N/A 07/21/2013   Procedure: LOOP RECORDER IMPLANT;  Surgeon: Evans Lance, MD;  Location: [facility] CATH LAB;  Service: Cardiovascular;  Laterality: N/A;   POLYPECTOMY  07/08/2018   Procedure: POLYPECTOMY;  Surgeon: Rogene Houston, MD;  [facility];  Service: Endoscopy;;  Descending colon polyps x 2    TEE WITHOUT CARDIOVERSION N/A 07/21/2013   Procedure: TRANSESOPHAGEAL ECHOCARDIOGRAM (TEE);  Surgeon: Dorothy Spark, MD;  Location: [facility] ENDOSCOPY;  Service: Cardiovascular;  Laterality: N/A;    OB History     Gravida  0   Para  0   Term  0   Preterm  0   AB  0   Living         SAB  0   IAB  0   Ectopic  0   Multiple      Live Births               Home Medications    Prior to Admission medications   Medication Sig Start Date End Date Taking? Authorizing Provider  albuterol (VENTOLIN HFA) 108 (90 Base) MCG/ACT inhaler Inhale 2 puffs into the lungs every 6 (six) hours as  needed for wheezing or shortness of breath. 11/21/20   Gerlene Fee, NP  aspirin 81 MG chewable tablet Chew 2 tablets (162 mg total) by mouth daily. 07/09/18   Rogene Houston, MD  BD INSULIN SYRINGE U/F 31G X 5/16" 0.3 ML MISC Inject 1 each into the skin 2 (two) times daily. as directed 11/21/20   Gerlene Fee, NP  benzonatate (TESSALON) 100 MG capsule Take 1 capsule (100 mg total) by mouth 3 (three) times daily as needed for cough. 11/21/20   Gerlene Fee, NP  cyanocobalamin (,VITAMIN B-12,) 1000 MCG/ML injection Inject 1 mL into the muscle every 30 (thirty) days. 11/01/14   [provider]  insulin lispro (HUMALOG) 100 UNIT/ML injection Inject 0.05 mLs (5 Units  total) into the skin 3 (three) times daily before meals. For cbg >150 11/21/20   Gerlene Fee, NP  insulin NPH Human (NOVOLIN N) 100 UNIT/ML injection Inject 0.07 mLs (7 Units total) into the skin 2 (two) times daily before a meal. 11/21/20   Nyoka Cowden, Phylis Bougie, NP  labetalol (NORMODYNE) 100 MG tablet Take 1 tablet (100 mg total) by mouth 2 (two) times daily. 11/21/20 12/21/20  Gerlene Fee, NP  lidocaine-prilocaine (EMLA) cream Apply 1 application topically every Monday, Wednesday, and Friday. 11/21/20   Gerlene Fee, NP  multivitamin (RENA-VIT) TABS tablet Take 1 tablet by mouth at bedtime. 08/21/14   Kirsteins, Luanna Salk, MD  NON FORMULARY Diet: NAS, Cons CHO    [provider]  NON FORMULARY Oxygen @ 2L/min via [place] PRN to keep sat above 90%  Special Instructions: Document O2 sat qshift. As Needed; PRN 1, PRN 2, PRN 3, PRN 4, PRN 5, PRN 6    [provider]  omeprazole (PRILOSEC) 20 MG capsule Take 1 capsule (20 mg total) by mouth daily. 03/24/20   Maudie Flakes, MD  pentoxifylline (TRENTAL) 400 MG CR tablet Take 1 tablet (400 mg total) by mouth daily with breakfast. 11/21/20   Gerlene Fee, NP  polyethylene glycol (MIRALAX / GLYCOLAX) 17 g packet Take 17 g by mouth 2 (two) times daily as needed for mild  constipation. 11/08/20   Mercy Riding, MD  senna-docusate (SENOKOT-S) 8.6-50 MG tablet Take 1 tablet by mouth 2 (two) times daily as needed for moderate constipation. 11/08/20   Mercy Riding, MD  sevelamer carbonate (RENVELA) 800 MG tablet Take 1 tablet (800 mg total) by mouth 2 (two) times daily before lunch and supper. 11/21/20   Gerlene Fee, NP    Family History Family History  Problem Relation Age of Onset   Cancer Sister    Cancer Brother    Anesthesia problems Neg Hx    Hypotension Neg Hx    Malignant hyperthermia Neg Hx    Pseudochol deficiency Neg Hx     Social History Social History   Tobacco Use   Smoking status: Never   Smokeless tobacco: Never  Vaping Use   Vaping Use: Never used  Substance Use Topics   Alcohol use: No   Drug use: No     Allergies   Ambien [zolpidem] and Reglan [metoclopramide]   Review of Systems Review of Systems Per HPI  Physical Exam Triage Vital Signs ED Triage Vitals  Enc Vitals Group     BP 03/04/21 1552 (!) 184/72     Pulse Rate 03/04/21 1552 79     Resp 03/04/21 1552 18     Temp 03/04/21 1552 98.4 F (36.9 C)     Temp src --      SpO2 03/04/21 1552 96 %     Weight --      Height --      Head Circumference --      Peak Flow --      Pain Score 03/04/21 1550 8     Pain Loc --      Pain Edu? --      Excl. in [place]? --    No data found.  Updated Vital Signs BP (!) 184/72   Pulse 79   Temp 98.4 F (36.9 C)   Resp 18   SpO2 96%   Visual Acuity Right Eye Distance:   Left Eye Distance:   Bilateral Distance:  Right Eye Near:   Left Eye Near:    Bilateral Near:     Physical Exam Vitals and nursing note reviewed.  Constitutional:      Appearance: Normal appearance. She is not ill-appearing.  HENT:     Head: Atraumatic.     Mouth/Throat:     Mouth: Mucous membranes are moist.     Pharynx: Oropharynx is clear.  Eyes:     Extraocular Movements: Extraocular movements intact.     Conjunctiva/sclera:  Conjunctivae normal.  Cardiovascular:     Rate and Rhythm: Normal rate and regular rhythm.     Heart sounds: Normal heart sounds.  Pulmonary:     Effort: Pulmonary effort is normal.     Breath sounds: Normal breath sounds.  Musculoskeletal:        General: Tenderness and signs of injury present. No swelling or deformity.     Cervical back: Normal range of motion and neck supple.     Comments: In wheelchair, good range of motion but pain with weightbearing and trying to walk Moderate tenderness to palpation left posterior buttock wrapping around to lateral hip and medial groin  Skin:    General: Skin is warm and dry.  Neurological:     Mental Status: She is alert and oriented to person, place, and time.     Comments: Bilateral lower extremities neurovascular intact  Psychiatric:        Mood and Affect: Mood normal.        Thought Content: Thought content normal.        Judgment: Judgment normal.     UC Treatments / Results  Labs (all labs ordered are listed, but only abnormal results are displayed) Labs Reviewed - No data to display  EKG   Radiology DG Hip Unilat With Pelvis 2-3 Views Left  Result Date: 03/04/2021 CLINICAL DATA:  Left hip pain following fall 1 month ago, initial encounter EXAM: DG HIP (WITH OR WITHOUT PELVIS) 3V LEFT COMPARISON:  None. FINDINGS: There is minimal irregularity and sclerosis along the superior pubic ramus on the left suggestive of undisplaced fracture. This is best visualized on the frontal film. Degenerative changes of the hip joints are noted bilaterally. No acute fracture or dislocation involving the proximal femur is seen. No soft tissue abnormality is noted. IMPRESSION: Findings suspicious for undisplaced fracture of the left superior pubic ramus. CT may be helpful for further evaluation as clinically indicated. Electronically Signed   By: Inez Catalina M.D.   On: 03/04/2021 16:31    Procedures Procedures (including critical care  time)  Medications Ordered in UC Medications - No data to display  Initial Impression / Assessment and Plan / UC Course  I have reviewed the triage vital signs and the nursing notes.  Pertinent labs & imaging results that were available during my care of the patient were reviewed by me and considered in my medical decision making (see chart for details).     Suspected nondisplaced pubic ramus fracture, CT scan recommended for further evaluation.  Recommended she go to the emergency department for immediate CT scan and further management if needed based on this as she lives alone and will need to be walking and maneuvering on the hip.  Discussed risks of continuing to ambulate on the area in case a fracture is in fact present.  Discussed if absolutely not willing to go to the emergency department today to call orthopedics first thing tomorrow morning and get an appointment soon as possible and  to remain nonweightbearing in the meantime.  Daughter who is with her is agreeable to this plan.  She will try to take her to the emergency department today.  Patient is hemodynamically stable for transport at this time via private vehicle.  Final Clinical Impressions(s) / UC Diagnoses   Final diagnoses:  Closed fracture of ramus of left pubis, initial encounter ([place])  Fall, initial encounter   Discharge Instructions   None    ED Prescriptions   None    PDMP not reviewed this encounter.   Volney American, [place] 03/04/21 1644

## 2021-03-04 NOTE — ED Triage Notes (Signed)
Pt presents with left hip and groin pain, states it is difficult to walk. Pt had fall last month and pain began later

## 2021-03-05 DIAGNOSIS — E113393 Type 2 diabetes mellitus with moderate nonproliferative diabetic retinopathy without macular edema, bilateral: Secondary | ICD-10-CM | POA: Diagnosis not present

## 2021-03-06 DIAGNOSIS — N186 End stage renal disease: Secondary | ICD-10-CM | POA: Diagnosis not present

## 2021-03-06 DIAGNOSIS — Z992 Dependence on renal dialysis: Secondary | ICD-10-CM | POA: Diagnosis not present

## 2021-03-08 DIAGNOSIS — N186 End stage renal disease: Secondary | ICD-10-CM | POA: Diagnosis not present

## 2021-03-08 DIAGNOSIS — Z992 Dependence on renal dialysis: Secondary | ICD-10-CM | POA: Diagnosis not present

## 2021-03-11 DIAGNOSIS — Z992 Dependence on renal dialysis: Secondary | ICD-10-CM | POA: Diagnosis not present

## 2021-03-11 DIAGNOSIS — N186 End stage renal disease: Secondary | ICD-10-CM | POA: Diagnosis not present

## 2021-03-13 DIAGNOSIS — N186 End stage renal disease: Secondary | ICD-10-CM | POA: Diagnosis not present

## 2021-03-13 DIAGNOSIS — Z992 Dependence on renal dialysis: Secondary | ICD-10-CM | POA: Diagnosis not present

## 2021-03-15 DIAGNOSIS — I120 Hypertensive chronic kidney disease with stage 5 chronic kidney disease or end stage renal disease: Secondary | ICD-10-CM | POA: Diagnosis not present

## 2021-03-15 DIAGNOSIS — E1122 Type 2 diabetes mellitus with diabetic chronic kidney disease: Secondary | ICD-10-CM | POA: Diagnosis not present

## 2021-03-15 DIAGNOSIS — N186 End stage renal disease: Secondary | ICD-10-CM | POA: Diagnosis not present

## 2021-03-15 DIAGNOSIS — Z992 Dependence on renal dialysis: Secondary | ICD-10-CM | POA: Diagnosis not present

## 2021-03-16 ENCOUNTER — Encounter: Payer: Self-pay | Admitting: Emergency Medicine

## 2021-03-16 ENCOUNTER — Ambulatory Visit (INDEPENDENT_AMBULATORY_CARE_PROVIDER_SITE_OTHER): Payer: Medicare Other

## 2021-03-16 ENCOUNTER — Ambulatory Visit
Admission: EM | Admit: 2021-03-16 | Discharge: 2021-03-16 | Disposition: A | Discharge status: Home / Self Care | Payer: Medicare Other | Attending: Family Medicine | Admitting: Family Medicine

## 2021-03-16 DIAGNOSIS — W19XXXA Unspecified fall, initial encounter: Secondary | ICD-10-CM

## 2021-03-16 DIAGNOSIS — M25561 Pain in right knee: Secondary | ICD-10-CM | POA: Diagnosis not present

## 2021-03-16 DIAGNOSIS — M25562 Pain in left knee: Secondary | ICD-10-CM | POA: Diagnosis not present

## 2021-03-16 MED ORDER — DICLOFENAC SODIUM 1 % EX GEL: 4.0000 g | Freq: Four times a day (QID) | CUTANEOUS | 0 refills | Status: DC | PRN

## 2021-03-16 NOTE — ED Provider Notes (Signed)
[facility]    CSN: [id number] Arrival date & time: 03/16/21  0801      History   Chief Complaint No chief complaint on file.   HPI [name] is a 80 y.o. female.   HPI Patient presents today with bilateral knee pain following fall with landing on both knees while ambulating up the stairs yesterday. Patient is s/p ramus left pubis fracture related to fall on 03/04/21. 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  Endorses pain with weight bearing and extension. Denies any visible swelling. Concern today about fracture or dislocation.  Past Medical History:  Diagnosis Date   Anemia    Cataract    Chronic kidney disease    CVA (cerebral infarction)    Diabetes mellitus with ESRD (end-stage renal disease) ([place])    Type 2   Dialysis patient ([place])    M, W, F   Fistula    R arm   GERD (gastroesophageal reflux disease)    Hypertension    Renal disorder    Shortness of breath    Stroke [facility])    right side weakness    Patient Active Problem List   Diagnosis Date Noted   Hypertensive heart and CKD, ESRD on dialysis ([place]) 11/09/2020   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 due to type 2 diabetes mellitus ([place]) 11/09/2020   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 diabetes mellitus ([place]) 11/09/2020   Dependence on renal dialysis ([place]) 11/09/2020   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 disease ([place]) 11/09/2020   Hyperlipidemia associated with type 2 diabetes mellitus ([place]) 11/09/2020   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 ([place]) 11/09/2020   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 ([place]) 11/09/2020   Chronic constipation 11/09/2020   Vitamin B 12 deficiency 11/09/2020   Acute respiratory failure with hypoxia ([place]) 10/26/2020   Elevated troponin 10/26/2020   Volume overload 10/26/2020   Hypertensive urgency 10/26/2020   History  of CVA (cerebrovascular accident) 01/05/2020   Thrombocytopenia ([place]) 01/05/2020   History of colonic polyps 04/01/2018   Acute bronchitis 10/15/2017   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 ([place])    HCAP (healthcare-associated pneumonia) 05/29/2015   Hyperkalemia 05/29/2015   ESRD (end stage renal disease) on dialysis [facility])    Encounter for adequacy testing for hemodialysis ([place]) 01/17/2014   Mechanical complication of other vascular device, implant, and graft 01/17/2014   Disturbances of vision, late effect of stroke 11/03/2013   Right hemiparesis ([place]) 08/19/2013   CVA (cerebral infarction) 07/20/2013   Gastroparesis due to DM ([place]) 07/01/2013   HLD (hyperlipidemia) 05/20/2006   Hereditary and idiopathic peripheral neuropathy 05/20/2006   Hypertension associated with diabetes ([place]) 05/20/2006    Past Surgical History:  Procedure Laterality Date   AMPUTATION Right 01/13/2020   Procedure: AMPUTATION RIGHT FOOT 1ST RAY;  Surgeon: Newt Minion, MD;  Location: [place];  Service: Orthopedics;  Laterality: Right;   AV FISTULA PLACEMENT Right 09/08/2013   Procedure: CREATION OF RIGHT BRACHIAL CEPHALIC ARTERIOVENOUS FISTULA ;  Surgeon: Mal Misty, MD;  Location: [place];  Service: Vascular;  Laterality: Right;   [place] Right 01/26/2014   Procedure: Right Arm BASILIC VEIN TRANSPOSITION;  Surgeon: Mal Misty, MD;  Location: [place];  Service: Vascular;  Laterality: Right;   CATARACT  EXTRACTION W/PHACO  11/20/2011   Procedure: CATARACT EXTRACTION PHACO AND INTRAOCULAR LENS PLACEMENT (IOC);  Surgeon: Tonny Branch, MD;  [facility];  Service: Ophthalmology;  Laterality: Right;  CDE 18.82   CATARACT EXTRACTION W/PHACO Left 11/18/2012   Procedure: CATARACT EXTRACTION PHACO AND INTRAOCULAR LENS PLACEMENT (IOC);  Surgeon: Tonny Branch, MD;  [facility];  Service: Ophthalmology;  Laterality: Left;  CDE: 18.97   COLONOSCOPY N/A  02/09/2013   Procedure: COLONOSCOPY;  Surgeon: Rogene Houston, MD;  [facility];  Service: Endoscopy;  Laterality: N/A;  305-moved to 220 Ann to notify pt   COLONOSCOPY N/A 07/08/2018   Procedure: COLONOSCOPY;  Surgeon: Rogene Houston, MD;  [facility];  Service: Endoscopy;  Laterality: N/A;  930   INSERTION OF DIALYSIS CATHETER Right 06/24/2013   Procedure: INSERTION OF DIALYSIS CATHETER: Ultrasound guided;  Surgeon: Serafina Mitchell, MD;  Location: [place];  Service: Vascular;  Laterality: Right;   LIGATION OF ARTERIOVENOUS  FISTULA Right 01/26/2014   Procedure: LIGATION OF ARTERIOVENOUS  FISTULA;  Surgeon: Mal Misty, MD;  Location: [place];  Service: Vascular;  Laterality: Right;   LOOP RECORDER IMPLANT N/A 07/21/2013   Procedure: LOOP RECORDER IMPLANT;  Surgeon: Evans Lance, MD;  Location: [facility] CATH LAB;  Service: Cardiovascular;  Laterality: N/A;   POLYPECTOMY  07/08/2018   Procedure: POLYPECTOMY;  Surgeon: Rogene Houston, MD;  [facility];  Service: Endoscopy;;  Descending colon polyps x 2    TEE WITHOUT CARDIOVERSION N/A 07/21/2013   Procedure: TRANSESOPHAGEAL ECHOCARDIOGRAM (TEE);  Surgeon: Dorothy Spark, MD;  Location: [facility] ENDOSCOPY;  Service: Cardiovascular;  Laterality: N/A;    OB History     Gravida  0   Para  0   Term  0   Preterm  0   AB  0   Living         SAB  0   IAB  0   Ectopic  0   Multiple      Live Births               Home Medications    Prior to Admission medications   Medication Sig Start Date End Date Taking? Authorizing Provider  diclofenac Sodium (VOLTAREN) 1 % GEL Apply 4 g topically 4 (four) times daily as needed. Apply to bilateral knees 03/16/21  Yes Scot Jun, FNP  albuterol (VENTOLIN HFA) 108 (90 Base) MCG/ACT inhaler Inhale 2 puffs into the lungs every 6 (six) hours as needed for wheezing or shortness of breath. 11/21/20   Gerlene Fee, NP  aspirin 81 MG chewable tablet Chew 2 tablets  (162 mg total) by mouth daily. 07/09/18   Rogene Houston, MD  BD INSULIN SYRINGE U/F 31G X 5/16" 0.3 ML MISC Inject 1 each into the skin 2 (two) times daily. as directed 11/21/20   Gerlene Fee, NP  benzonatate (TESSALON) 100 MG capsule Take 1 capsule (100 mg total) by mouth 3 (three) times daily as needed for cough. Patient not taking: No sig reported 11/21/20   Gerlene Fee, NP  cyanocobalamin (,VITAMIN B-12,) 1000 MCG/ML injection Inject 1 mL into the muscle every 30 (thirty) days. 11/01/14   [provider]  insulin lispro (HUMALOG) 100 UNIT/ML injection Inject 0.05 mLs (5 Units total) into the skin 3 (three) times daily before meals. For cbg >150 Patient not taking: No sig reported 11/21/20   Gerlene Fee, NP  insulin  NPH Human (NOVOLIN N) 100 UNIT/ML injection Inject 0.07 mLs (7 Units total) into the skin 2 (two) times daily before a meal. 11/21/20   Nyoka Cowden, Phylis Bougie, NP  labetalol (NORMODYNE) 100 MG tablet Take 1 tablet (100 mg total) by mouth 2 (two) times daily. Patient taking differently: Take 200 mg by mouth 2 (two) times daily. 11/21/20 03/04/21  Gerlene Fee, NP  lidocaine-prilocaine (EMLA) cream Apply 1 application topically every Monday, Wednesday, and Friday. 11/21/20   Gerlene Fee, NP  multivitamin (RENA-VIT) TABS tablet Take 1 tablet by mouth at bedtime. 08/21/14   Kirsteins, Luanna Salk, MD  NON FORMULARY Diet: NAS, Cons CHO    [provider]  NON FORMULARY Oxygen @ 2L/min via [place] PRN to keep sat above 90%  Special Instructions: Document O2 sat qshift. As Needed; PRN 1, PRN 2, PRN 3, PRN 4, PRN 5, PRN 6 Patient not taking: No sig reported    [provider]  omeprazole (PRILOSEC) 20 MG capsule Take 1 capsule (20 mg total) by mouth daily. Patient not taking: Reported on 03/04/2021 03/24/20   Maudie Flakes, MD  pentoxifylline (TRENTAL) 400 MG CR tablet Take 1 tablet (400 mg total) by mouth daily with breakfast. Patient not taking: No sig reported  11/21/20   Gerlene Fee, NP  polyethylene glycol (MIRALAX / GLYCOLAX) 17 g packet Take 17 g by mouth 2 (two) times daily as needed for mild constipation. Patient not taking: No sig reported 11/08/20   Mercy Riding, MD  senna-docusate (SENOKOT-S) 8.6-50 MG tablet Take 1 tablet by mouth 2 (two) times daily as needed for moderate constipation. Patient not taking: No sig reported 11/08/20   Mercy Riding, MD  sevelamer carbonate (RENVELA) 800 MG tablet Take 1 tablet (800 mg total) by mouth 2 (two) times daily before lunch and supper. 11/21/20   Gerlene Fee, NP    Family History Family History  Problem Relation Age of Onset   Cancer Sister    Cancer Brother    Anesthesia problems Neg Hx    Hypotension Neg Hx    Malignant hyperthermia Neg Hx    Pseudochol deficiency Neg Hx     Social History Social History   Tobacco Use   Smoking status: Never   Smokeless tobacco: Never  Vaping Use   Vaping Use: Never used  Substance Use Topics   Alcohol use: No   Drug use: No     Allergies   Ambien [zolpidem] and Reglan [metoclopramide]   Review of Systems Review of Systems Pertinent negatives listed in HPI  Physical Exam Triage Vital Signs ED Triage Vitals [03/16/21 0812]  Enc Vitals Group     BP (!) 161/66     Pulse Rate 77     Resp 18     Temp 98.7 F (37.1 C)     Temp Source Oral     SpO2 97 %     Weight      Height      Head Circumference      Peak Flow      Pain Score 7     Pain Loc      Pain Edu?      Excl. in [place]?    No data found.  Updated Vital Signs BP (!) 161/66 (BP Location: Right Arm)   Pulse 77   Temp 98.7 F (37.1 C) (Oral)   Resp 18   SpO2 97%   Visual Acuity Right Eye Distance:  Left Eye Distance:   Bilateral Distance:    Right Eye Near:   Left Eye Near:    Bilateral Near:     Physical Exam Constitutional:      Comments: Chronically ill appearing   HENT:     Head: Normocephalic.     Nose: Nose normal.  Eyes:     Extraocular  Movements: Extraocular movements intact.     Pupils: Pupils are equal, round, and reactive to light.  Cardiovascular:     Rate and Rhythm: Normal rate and regular rhythm.  Pulmonary:     Effort: Pulmonary effort is normal.     Breath sounds: Normal breath sounds.  Musculoskeletal:     Right knee: Decreased range of motion. Tenderness present over the ACL and patellar tendon.     Left knee: Decreased range of motion. Tenderness present over the ACL and patellar tendon.  Skin:    General: Skin is warm.     Capillary Refill: Capillary refill takes less than 2 seconds.  Neurological:     General: No focal deficit present.     Mental Status: She is alert and oriented to person, place, and time.  Psychiatric:        Mood and Affect: Mood normal.        Behavior: Behavior normal.        Thought Content: Thought content normal.        Judgment: Judgment normal.     UC Treatments / Results  Labs (all labs ordered are listed, but only abnormal results are displayed) Labs Reviewed - No data to display  EKG   Radiology No results found.  Procedures Procedures (including critical care time)  Medications Ordered in UC Medications - No data to display  Initial Impression / Assessment and Plan / UC Course  I have reviewed the triage vital signs and the nursing notes.  Pertinent labs & imaging results that were available during my care of the patient were reviewed by me and considered in my medical decision making (see chart for details).    Bilateral knee pain s/p fall walking up stairs x 1 day ago.  Imaging negative for acute fracture of bilateral knees  Recommend continue tylenol 650 mg every 6 hours as needed for pain. Apply Voltaren gel directly to knees in the area for pain management as needed Follow-up with orthopedic specialist as needed.  Final Clinical Impressions(s) / UC Diagnoses   Final diagnoses:  Acute pain of both knees   Discharge Instructions   None     ED Prescriptions     Medication Sig Dispense Auth. Provider   diclofenac Sodium (VOLTAREN) 1 % GEL Apply 4 g topically 4 (four) times daily as needed. Apply to bilateral knees 100 g Scot Jun, FNP      PDMP not reviewed this encounter.   Scot Jun,  03/16/21 [phone]

## 2021-03-16 NOTE — Discharge Instructions (Addendum)
X-rays negative for fracture of both knees Wrap in Ace wraps daily with walking and remove at rest or bedtime over the next 5-7 days Recommend continue tylenol 650 mg every 6 hours as needed for pain. Apply Voltaren gel directly to knees in the area for pain management as needed Follow-up with orthopedic specialist as needed.

## 2021-03-16 NOTE — ED Triage Notes (Signed)
Fell yesterday on both knees while going up steps.

## 2021-03-18 DIAGNOSIS — Z23 Encounter for immunization: Secondary | ICD-10-CM | POA: Diagnosis not present

## 2021-03-18 DIAGNOSIS — N186 End stage renal disease: Secondary | ICD-10-CM | POA: Diagnosis not present

## 2021-03-18 DIAGNOSIS — Z992 Dependence on renal dialysis: Secondary | ICD-10-CM | POA: Diagnosis not present

## 2021-03-19 DIAGNOSIS — S8001XA Contusion of right knee, initial encounter: Secondary | ICD-10-CM | POA: Diagnosis not present

## 2021-03-19 DIAGNOSIS — Z0001 Encounter for general adult medical examination with abnormal findings: Secondary | ICD-10-CM | POA: Diagnosis not present

## 2021-03-19 DIAGNOSIS — I10 Essential (primary) hypertension: Secondary | ICD-10-CM | POA: Diagnosis not present

## 2021-03-19 DIAGNOSIS — E1122 Type 2 diabetes mellitus with diabetic chronic kidney disease: Secondary | ICD-10-CM | POA: Diagnosis not present

## 2021-03-19 DIAGNOSIS — B353 Tinea pedis: Secondary | ICD-10-CM | POA: Diagnosis not present

## 2021-03-19 DIAGNOSIS — N186 End stage renal disease: Secondary | ICD-10-CM | POA: Diagnosis not present

## 2021-03-19 DIAGNOSIS — D510 Vitamin B12 deficiency anemia due to intrinsic factor deficiency: Secondary | ICD-10-CM | POA: Diagnosis not present

## 2021-03-20 DIAGNOSIS — Z992 Dependence on renal dialysis: Secondary | ICD-10-CM | POA: Diagnosis not present

## 2021-03-20 DIAGNOSIS — N186 End stage renal disease: Secondary | ICD-10-CM | POA: Diagnosis not present

## 2021-03-20 DIAGNOSIS — Z23 Encounter for immunization: Secondary | ICD-10-CM | POA: Diagnosis not present

## 2021-03-22 DIAGNOSIS — Z992 Dependence on renal dialysis: Secondary | ICD-10-CM | POA: Diagnosis not present

## 2021-03-22 DIAGNOSIS — Z23 Encounter for immunization: Secondary | ICD-10-CM | POA: Diagnosis not present

## 2021-03-22 DIAGNOSIS — N186 End stage renal disease: Secondary | ICD-10-CM | POA: Diagnosis not present

## 2021-03-25 DIAGNOSIS — N2581 Secondary hyperparathyroidism of renal origin: Secondary | ICD-10-CM | POA: Diagnosis not present

## 2021-03-25 DIAGNOSIS — D509 Iron deficiency anemia, unspecified: Secondary | ICD-10-CM | POA: Diagnosis not present

## 2021-03-25 DIAGNOSIS — Z23 Encounter for immunization: Secondary | ICD-10-CM | POA: Diagnosis not present

## 2021-03-25 DIAGNOSIS — Z992 Dependence on renal dialysis: Secondary | ICD-10-CM | POA: Diagnosis not present

## 2021-03-25 DIAGNOSIS — N186 End stage renal disease: Secondary | ICD-10-CM | POA: Diagnosis not present

## 2021-03-25 DIAGNOSIS — E119 Type 2 diabetes mellitus without complications: Secondary | ICD-10-CM | POA: Diagnosis not present

## 2021-03-25 LAB — HEMOGLOBIN A1C: Hemoglobin A1C: 8.5

## 2021-03-26 ENCOUNTER — Encounter: Payer: Self-pay | Admitting: Family

## 2021-03-26 ENCOUNTER — Ambulatory Visit (INDEPENDENT_AMBULATORY_CARE_PROVIDER_SITE_OTHER): Payer: Medicare Other | Admitting: Family

## 2021-03-26 DIAGNOSIS — B351 Tinea unguium: Secondary | ICD-10-CM | POA: Diagnosis not present

## 2021-03-26 DIAGNOSIS — G609 Hereditary and idiopathic neuropathy, unspecified: Secondary | ICD-10-CM | POA: Diagnosis not present

## 2021-03-26 NOTE — Progress Notes (Signed)
Office Visit Note   Patient: [name]           Date of Birth: 08-Dec-1940           MRN: [id number] Visit Date: 03/26/2021              Requested by: [name], MD [address] [place],  [place] [address] PCP: [name], MD  No chief complaint on file.     HPI: Patient presents today in follow-up for bilateral foot evaluation. She has a history of onychomycotic nails.  No concerns voiced.  Assessment & Plan: Visit Diagnoses:  1. Hereditary and idiopathic peripheral neuropathy   2. Onychomycosis     Plan: Onychomycotic nails trimmed x9. follow-up in 3 months  Follow-Up Instructions: Return in about 3 months (around 06/26/2021).   Ortho Exam  Patient is alert, oriented, no adenopathy, well-dressed, normal affect, normal respiratory effort. Bilateral feet are in good condition no ulcers no redness no cellulitis no swelling status post great toe amputation on the right.  Thickened and discolored onychomycotic nails.  Nails were trimmed today x9 patient tolerated well.  Imaging: No results found. No images are attached to the encounter.  Labs: Lab Results  Component Value Date   HGBA1C 9.4 (H) 10/31/2020   HGBA1C 7.7 (H) 01/05/2020   HGBA1C 7.5 (H) 10/14/2017   ESRSEDRATE 42 (H) 01/06/2020   CRP 20.3 (H) 01/06/2020   REPTSTATUS 11/07/2020 FINAL 11/02/2020   CULT  11/02/2020    NO GROWTH 5 DAYS Performed at [facility], [place] [address], [place], [place] [address]      Lab Results  Component Value Date   ALBUMIN 2.9 (L) 11/08/2020   ALBUMIN 3.1 (L) 11/07/2020   ALBUMIN 3.0 (L) 11/06/2020    Lab Results  Component Value Date   MG 1.9 06/26/2013   MG 1.7 01/25/2009   No results found for: VD25OH  No results found for: PREALBUMIN CBC EXTENDED Latest Ref Rng & Units 11/09/2020 11/08/2020 11/07/2020  WBC 4.0 - 10.5 K/uL 10.6(H) 10.5 15.5(H)  RBC 3.87 - 5.11 MIL/uL 3.71(L) 3.73(L) 3.90  HGB 12.0 - 15.0 g/dL 11.6(L) 11.6(L) 12.2  HCT  36.0 - 46.0 % 35.2(L) 34.4(L) 35.6(L)  PLT 150 - 400 K/uL 152 155 160  NEUTROABS 1.7 - 7.7 K/uL - 6.1 10.7(H)  LYMPHSABS 0.7 - 4.0 K/uL - 2.3 2.2     There is no height or weight on file to calculate BMI.  Orders:  No orders of the defined types were placed in this encounter.  No orders of the defined types were placed in this encounter.    Procedures: No procedures performed  Clinical Data: No additional findings.  ROS:  All other systems negative, except as noted in the HPI. Review of Systems  Objective: Vital Signs: There were no vitals taken for this visit.  Specialty Comments:  No specialty comments available.  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 ([place])  11/09/2020   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 ([place]) 11/09/2020   Chronic constipation 11/09/2020   Vitamin B 12 deficiency 11/09/2020   Acute respiratory failure with hypoxia (HCC) 10/26/2020   Elevated troponin 10/26/2020   Volume overload 10/26/2020   Hypertensive urgency 10/26/2020   History of CVA (cerebrovascular accident) 01/05/2020   Thrombocytopenia ([place]) 01/05/2020   History of colonic polyps 04/01/2018   Acute bronchitis 10/15/2017   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 ([place])    HCAP (healthcare-associated pneumonia) 05/29/2015   Hyperkalemia 05/29/2015   ESRD (end stage renal disease) on dialysis [facility])     Encounter for adequacy testing for hemodialysis ([place]) 01/17/2014   Mechanical complication of other vascular device, implant, and graft 01/17/2014   Disturbances of vision, late effect of stroke 11/03/2013   Right hemiparesis ([place]) 08/19/2013   CVA (cerebral infarction) 07/20/2013   Gastroparesis due to DM ([place]) 07/01/2013   HLD (hyperlipidemia) 05/20/2006   Hereditary and idiopathic peripheral neuropathy 05/20/2006   Hypertension associated with diabetes ([place]) 05/20/2006   Past Medical History:  Diagnosis Date   Anemia    Cataract    Chronic kidney disease    CVA (cerebral infarction)    Diabetes mellitus with ESRD (end-stage renal disease) ([place])    Type 2   Dialysis patient ([place])    M, W, F   Fistula    R arm   GERD (gastroesophageal reflux disease)    Hypertension    Renal disorder    Shortness of breath    Stroke ([place])    right side weakness    Family History  Problem Relation Age of Onset   Cancer Sister    Cancer Brother    Anesthesia problems Neg Hx    Hypotension Neg Hx    Malignant hyperthermia Neg Hx    Pseudochol deficiency Neg Hx     Past Surgical History:  Procedure Laterality Date   AMPUTATION Right 01/13/2020   Procedure: AMPUTATION RIGHT FOOT 1ST RAY;  Surgeon: Newt Minion, MD;  Location: [place];  Service: Orthopedics;  Laterality: Right;   AV FISTULA PLACEMENT Right 09/08/2013   Procedure: CREATION OF RIGHT BRACHIAL CEPHALIC ARTERIOVENOUS FISTULA ;  Surgeon: Mal Misty, MD;  Location: [place];  Service: Vascular;  Laterality: Right;   [place] Right 01/26/2014   Procedure: Right Arm BASILIC VEIN TRANSPOSITION;  Surgeon: Mal Misty, MD;  Location: [place];  Service: Vascular;  Laterality: Right;   CATARACT EXTRACTION W/PHACO  11/20/2011   Procedure: CATARACT EXTRACTION PHACO AND INTRAOCULAR LENS PLACEMENT ([place]);  Surgeon: Tonny Branch, MD;  Location[facility];  Service: Ophthalmology;  Laterality: Right;  CDE 18.82   CATARACT EXTRACTION  W/PHACO Left 11/18/2012   Procedure: CATARACT EXTRACTION PHACO AND INTRAOCULAR LENS PLACEMENT (IOC);  Surgeon: Tonny Branch, MD;  Location: [facility];  Service: Ophthalmology;  Laterality: Left;  CDE: 18.97   COLONOSCOPY N/A 02/09/2013   Procedure: COLONOSCOPY;  Surgeon: Rogene Houston, MD;  [facility];  Service: Endoscopy;  Laterality: N/A;  305-moved to 220 Ann to notify pt   COLONOSCOPY N/A 07/08/2018   Procedure: COLONOSCOPY;  Surgeon: Rogene Houston, MD;  [facility];  Service: Endoscopy;  Laterality: N/A;  930   INSERTION OF DIALYSIS CATHETER Right 06/24/2013   Procedure: INSERTION OF DIALYSIS CATHETER: Ultrasound guided;  Surgeon: Serafina Mitchell, MD;  Location: [place];  Service: Vascular;  Laterality: Right;  LIGATION OF ARTERIOVENOUS  FISTULA Right 01/26/2014   Procedure: LIGATION OF ARTERIOVENOUS  FISTULA;  Surgeon: Mal Misty, MD;  Location: [place];  Service: Vascular;  Laterality: Right;   LOOP RECORDER IMPLANT N/A 07/21/2013   Procedure: LOOP RECORDER IMPLANT;  Surgeon: Evans Lance, MD;  Location: [facility] CATH LAB;  Service: Cardiovascular;  Laterality: N/A;   POLYPECTOMY  07/08/2018   Procedure: POLYPECTOMY;  Surgeon: Rogene Houston, MD;  [facility];  Service: Endoscopy;;  Descending colon polyps x 2    TEE WITHOUT CARDIOVERSION N/A 07/21/2013   Procedure: TRANSESOPHAGEAL ECHOCARDIOGRAM (TEE);  Surgeon: Dorothy Spark, MD;  Location: [facility] ENDOSCOPY;  Service: Cardiovascular;  Laterality: N/A;   Social History   Occupational History   Not on file  Tobacco Use   Smoking status: Never   Smokeless tobacco: Never  Vaping Use   Vaping Use: Never used  Substance and Sexual Activity   Alcohol use: No   Drug use: No   Sexual activity: Not on file

## 2021-03-27 DIAGNOSIS — Z23 Encounter for immunization: Secondary | ICD-10-CM | POA: Diagnosis not present

## 2021-03-27 DIAGNOSIS — N186 End stage renal disease: Secondary | ICD-10-CM | POA: Diagnosis not present

## 2021-03-27 DIAGNOSIS — Z992 Dependence on renal dialysis: Secondary | ICD-10-CM | POA: Diagnosis not present

## 2021-03-29 DIAGNOSIS — N186 End stage renal disease: Secondary | ICD-10-CM | POA: Diagnosis not present

## 2021-03-29 DIAGNOSIS — Z992 Dependence on renal dialysis: Secondary | ICD-10-CM | POA: Diagnosis not present

## 2021-03-29 DIAGNOSIS — Z23 Encounter for immunization: Secondary | ICD-10-CM | POA: Diagnosis not present

## 2021-04-01 DIAGNOSIS — N186 End stage renal disease: Secondary | ICD-10-CM | POA: Diagnosis not present

## 2021-04-01 DIAGNOSIS — Z992 Dependence on renal dialysis: Secondary | ICD-10-CM | POA: Diagnosis not present

## 2021-04-01 DIAGNOSIS — Z23 Encounter for immunization: Secondary | ICD-10-CM | POA: Diagnosis not present

## 2021-04-03 DIAGNOSIS — Z992 Dependence on renal dialysis: Secondary | ICD-10-CM | POA: Diagnosis not present

## 2021-04-03 DIAGNOSIS — N186 End stage renal disease: Secondary | ICD-10-CM | POA: Diagnosis not present

## 2021-04-03 DIAGNOSIS — Z23 Encounter for immunization: Secondary | ICD-10-CM | POA: Diagnosis not present

## 2021-04-05 DIAGNOSIS — N186 End stage renal disease: Secondary | ICD-10-CM | POA: Diagnosis not present

## 2021-04-05 DIAGNOSIS — Z992 Dependence on renal dialysis: Secondary | ICD-10-CM | POA: Diagnosis not present

## 2021-04-05 DIAGNOSIS — Z23 Encounter for immunization: Secondary | ICD-10-CM | POA: Diagnosis not present

## 2021-04-08 DIAGNOSIS — Z23 Encounter for immunization: Secondary | ICD-10-CM | POA: Diagnosis not present

## 2021-04-08 DIAGNOSIS — N186 End stage renal disease: Secondary | ICD-10-CM | POA: Diagnosis not present

## 2021-04-08 DIAGNOSIS — Z992 Dependence on renal dialysis: Secondary | ICD-10-CM | POA: Diagnosis not present

## 2021-04-10 DIAGNOSIS — H31093 Other chorioretinal scars, bilateral: Secondary | ICD-10-CM | POA: Diagnosis not present

## 2021-04-10 DIAGNOSIS — E113592 Type 2 diabetes mellitus with proliferative diabetic retinopathy without macular edema, left eye: Secondary | ICD-10-CM | POA: Diagnosis not present

## 2021-04-10 DIAGNOSIS — N186 End stage renal disease: Secondary | ICD-10-CM | POA: Diagnosis not present

## 2021-04-10 DIAGNOSIS — H3561 Retinal hemorrhage, right eye: Secondary | ICD-10-CM | POA: Diagnosis not present

## 2021-04-10 DIAGNOSIS — Z23 Encounter for immunization: Secondary | ICD-10-CM | POA: Diagnosis not present

## 2021-04-10 DIAGNOSIS — E113511 Type 2 diabetes mellitus with proliferative diabetic retinopathy with macular edema, right eye: Secondary | ICD-10-CM | POA: Diagnosis not present

## 2021-04-10 DIAGNOSIS — Z992 Dependence on renal dialysis: Secondary | ICD-10-CM | POA: Diagnosis not present

## 2021-04-12 DIAGNOSIS — Z992 Dependence on renal dialysis: Secondary | ICD-10-CM | POA: Diagnosis not present

## 2021-04-12 DIAGNOSIS — Z23 Encounter for immunization: Secondary | ICD-10-CM | POA: Diagnosis not present

## 2021-04-12 DIAGNOSIS — N186 End stage renal disease: Secondary | ICD-10-CM | POA: Diagnosis not present

## 2021-04-15 DIAGNOSIS — Z992 Dependence on renal dialysis: Secondary | ICD-10-CM | POA: Diagnosis not present

## 2021-04-15 DIAGNOSIS — Z23 Encounter for immunization: Secondary | ICD-10-CM | POA: Diagnosis not present

## 2021-04-15 DIAGNOSIS — N186 End stage renal disease: Secondary | ICD-10-CM | POA: Diagnosis not present

## 2021-04-17 DIAGNOSIS — N186 End stage renal disease: Secondary | ICD-10-CM | POA: Diagnosis not present

## 2021-04-17 DIAGNOSIS — Z992 Dependence on renal dialysis: Secondary | ICD-10-CM | POA: Diagnosis not present

## 2021-04-17 DIAGNOSIS — E1122 Type 2 diabetes mellitus with diabetic chronic kidney disease: Secondary | ICD-10-CM | POA: Diagnosis not present

## 2021-04-19 DIAGNOSIS — N186 End stage renal disease: Secondary | ICD-10-CM | POA: Diagnosis not present

## 2021-04-19 DIAGNOSIS — Z992 Dependence on renal dialysis: Secondary | ICD-10-CM | POA: Diagnosis not present

## 2021-04-22 DIAGNOSIS — N2581 Secondary hyperparathyroidism of renal origin: Secondary | ICD-10-CM | POA: Diagnosis not present

## 2021-04-22 DIAGNOSIS — N186 End stage renal disease: Secondary | ICD-10-CM | POA: Diagnosis not present

## 2021-04-22 DIAGNOSIS — Z992 Dependence on renal dialysis: Secondary | ICD-10-CM | POA: Diagnosis not present

## 2021-04-24 DIAGNOSIS — N186 End stage renal disease: Secondary | ICD-10-CM | POA: Diagnosis not present

## 2021-04-24 DIAGNOSIS — Z992 Dependence on renal dialysis: Secondary | ICD-10-CM | POA: Diagnosis not present

## 2021-04-26 DIAGNOSIS — Z992 Dependence on renal dialysis: Secondary | ICD-10-CM | POA: Diagnosis not present

## 2021-04-26 DIAGNOSIS — N186 End stage renal disease: Secondary | ICD-10-CM | POA: Diagnosis not present

## 2021-04-29 DIAGNOSIS — N2581 Secondary hyperparathyroidism of renal origin: Secondary | ICD-10-CM | POA: Diagnosis not present

## 2021-04-29 DIAGNOSIS — N186 End stage renal disease: Secondary | ICD-10-CM | POA: Diagnosis not present

## 2021-04-29 DIAGNOSIS — Z992 Dependence on renal dialysis: Secondary | ICD-10-CM | POA: Diagnosis not present

## 2021-05-01 DIAGNOSIS — Z992 Dependence on renal dialysis: Secondary | ICD-10-CM | POA: Diagnosis not present

## 2021-05-01 DIAGNOSIS — N186 End stage renal disease: Secondary | ICD-10-CM | POA: Diagnosis not present

## 2021-05-03 DIAGNOSIS — N186 End stage renal disease: Secondary | ICD-10-CM | POA: Diagnosis not present

## 2021-05-03 DIAGNOSIS — Z992 Dependence on renal dialysis: Secondary | ICD-10-CM | POA: Diagnosis not present

## 2021-05-06 DIAGNOSIS — N186 End stage renal disease: Secondary | ICD-10-CM | POA: Diagnosis not present

## 2021-05-06 DIAGNOSIS — Z992 Dependence on renal dialysis: Secondary | ICD-10-CM | POA: Diagnosis not present

## 2021-05-08 DIAGNOSIS — N186 End stage renal disease: Secondary | ICD-10-CM | POA: Diagnosis not present

## 2021-05-08 DIAGNOSIS — Z992 Dependence on renal dialysis: Secondary | ICD-10-CM | POA: Diagnosis not present

## 2021-05-10 DIAGNOSIS — Z992 Dependence on renal dialysis: Secondary | ICD-10-CM | POA: Diagnosis not present

## 2021-05-10 DIAGNOSIS — N186 End stage renal disease: Secondary | ICD-10-CM | POA: Diagnosis not present

## 2021-05-13 DIAGNOSIS — Z992 Dependence on renal dialysis: Secondary | ICD-10-CM | POA: Diagnosis not present

## 2021-05-13 DIAGNOSIS — E538 Deficiency of other specified B group vitamins: Secondary | ICD-10-CM | POA: Diagnosis not present

## 2021-05-13 DIAGNOSIS — N186 End stage renal disease: Secondary | ICD-10-CM | POA: Diagnosis not present

## 2021-05-13 DIAGNOSIS — N2581 Secondary hyperparathyroidism of renal origin: Secondary | ICD-10-CM | POA: Diagnosis not present

## 2021-05-15 DIAGNOSIS — E1122 Type 2 diabetes mellitus with diabetic chronic kidney disease: Secondary | ICD-10-CM | POA: Diagnosis not present

## 2021-05-15 DIAGNOSIS — I120 Hypertensive chronic kidney disease with stage 5 chronic kidney disease or end stage renal disease: Secondary | ICD-10-CM | POA: Diagnosis not present

## 2021-05-15 DIAGNOSIS — Z992 Dependence on renal dialysis: Secondary | ICD-10-CM | POA: Diagnosis not present

## 2021-05-15 DIAGNOSIS — N186 End stage renal disease: Secondary | ICD-10-CM | POA: Diagnosis not present

## 2021-05-17 DIAGNOSIS — N186 End stage renal disease: Secondary | ICD-10-CM | POA: Diagnosis not present

## 2021-05-17 DIAGNOSIS — Z992 Dependence on renal dialysis: Secondary | ICD-10-CM | POA: Diagnosis not present

## 2021-05-17 DIAGNOSIS — E1122 Type 2 diabetes mellitus with diabetic chronic kidney disease: Secondary | ICD-10-CM | POA: Diagnosis not present

## 2021-05-20 DIAGNOSIS — Z992 Dependence on renal dialysis: Secondary | ICD-10-CM | POA: Diagnosis not present

## 2021-05-20 DIAGNOSIS — N186 End stage renal disease: Secondary | ICD-10-CM | POA: Diagnosis not present

## 2021-05-21 ENCOUNTER — Ambulatory Visit (INDEPENDENT_AMBULATORY_CARE_PROVIDER_SITE_OTHER): Payer: Medicare Other | Admitting: "Endocrinology

## 2021-05-21 ENCOUNTER — Other Ambulatory Visit: Payer: Self-pay

## 2021-05-21 ENCOUNTER — Encounter: Payer: Self-pay | Admitting: "Endocrinology

## 2021-05-21 VITALS — BP 145/68 | HR 64 | Ht 68.0 in | Wt 168.4 lb

## 2021-05-21 DIAGNOSIS — E1122 Type 2 diabetes mellitus with diabetic chronic kidney disease: Secondary | ICD-10-CM

## 2021-05-21 DIAGNOSIS — E1169 Type 2 diabetes mellitus with other specified complication: Secondary | ICD-10-CM

## 2021-05-21 DIAGNOSIS — I120 Hypertensive chronic kidney disease with stage 5 chronic kidney disease or end stage renal disease: Secondary | ICD-10-CM | POA: Diagnosis not present

## 2021-05-21 DIAGNOSIS — N186 End stage renal disease: Secondary | ICD-10-CM

## 2021-05-21 DIAGNOSIS — E785 Hyperlipidemia, unspecified: Secondary | ICD-10-CM | POA: Diagnosis not present

## 2021-05-21 DIAGNOSIS — I1311 Hypertensive heart and chronic kidney disease without heart failure, with stage 5 chronic kidney disease, or end stage renal disease: Secondary | ICD-10-CM | POA: Diagnosis not present

## 2021-05-21 DIAGNOSIS — Z992 Dependence on renal dialysis: Secondary | ICD-10-CM | POA: Diagnosis not present

## 2021-05-21 NOTE — Patient Instructions (Signed)

## 2021-05-21 NOTE — Progress Notes (Signed)
Endocrinology Consult Note       05/21/2021, 9:00 PM   Subjective:    Patient ID: [name], female    DOB: 27-Dec-1940.  [name] is being seen in consultation for management of currently uncontrolled symptomatic diabetes requested by  [name], MD.   Past Medical History:  Diagnosis Date   Anemia    Cataract    Chronic kidney disease    CVA (cerebral infarction)    Diabetes mellitus with ESRD (end-stage renal disease) ([place])    Type 2   Dialysis patient ([place])    M, W, F   Fistula    R arm   GERD (gastroesophageal reflux disease)    Hypertension    Renal disorder    Shortness of breath    Stroke [facility])    right side weakness    Past Surgical History:  Procedure Laterality Date   AMPUTATION Right 01/13/2020   Procedure: AMPUTATION RIGHT FOOT 1ST RAY;  Surgeon: Newt Minion, MD;  Location: [place];  Service: Orthopedics;  Laterality: Right;   AV FISTULA PLACEMENT Right 09/08/2013   Procedure: CREATION OF RIGHT BRACHIAL CEPHALIC ARTERIOVENOUS FISTULA ;  Surgeon: Mal Misty, MD;  Location: [place];  Service: Vascular;  Laterality: Right;   [place] Right 01/26/2014   Procedure: Right Arm BASILIC VEIN TRANSPOSITION;  Surgeon: Mal Misty, MD;  Location: [place];  Service: Vascular;  Laterality: Right;   CATARACT EXTRACTION W/PHACO  11/20/2011   Procedure: CATARACT EXTRACTION PHACO AND INTRAOCULAR LENS PLACEMENT ([place]);  Surgeon: Tonny Branch, MD;  Location: [facility];  Service: Ophthalmology;  Laterality: Right;  CDE 18.82   CATARACT EXTRACTION W/PHACO Left 11/18/2012   Procedure: CATARACT EXTRACTION PHACO AND INTRAOCULAR LENS PLACEMENT ([facility]);  Surgeon: Tonny Branch, MD;  Location: [facility];  Service: Ophthalmology;  Laterality: Left;  CDE: 18.97   COLONOSCOPY N/A 02/09/2013   Procedure: COLONOSCOPY;  Surgeon: Rogene Houston, MD;  [facility];  Service:  Endoscopy;  Laterality: N/A;  305-moved to 220 Ann to notify pt   COLONOSCOPY N/A 07/08/2018   Procedure: COLONOSCOPY;  Surgeon: Rogene Houston, MD;  [facility];  Service: Endoscopy;  Laterality: N/A;  930   INSERTION OF DIALYSIS CATHETER Right 06/24/2013   Procedure: INSERTION OF DIALYSIS CATHETER: Ultrasound guided;  Surgeon: Serafina Mitchell, MD;  Location: [place];  Service: Vascular;  Laterality: Right;   LIGATION OF ARTERIOVENOUS  FISTULA Right 01/26/2014   Procedure: LIGATION OF ARTERIOVENOUS  FISTULA;  Surgeon: Mal Misty, MD;  Location: [place];  Service: Vascular;  Laterality: Right;   LOOP RECORDER IMPLANT N/A 07/21/2013   Procedure: LOOP RECORDER IMPLANT;  Surgeon: Evans Lance, MD;  Location: [facility] CATH LAB;  Service: Cardiovascular;  Laterality: N/A;   POLYPECTOMY  07/08/2018   Procedure: POLYPECTOMY;  Surgeon: Rogene Houston, MD;  [facility];  Service: Endoscopy;;  Descending colon polyps x 2    TEE WITHOUT CARDIOVERSION N/A 07/21/2013   Procedure: TRANSESOPHAGEAL ECHOCARDIOGRAM (TEE);  Surgeon: Dorothy Spark, MD;  Location: [place];  Service: Cardiovascular;  Laterality:  N/A;    Social History   Socioeconomic History   Marital status: Widowed    Spouse name: Not on file   Number of children: Not on file   Years of education: Not on file   Highest education level: Not on file  Occupational History   Not on file  Tobacco Use   Smoking status: Never   Smokeless tobacco: Never  Vaping Use   Vaping Use: Never used  Substance and Sexual Activity   Alcohol use: No   Drug use: No   Sexual activity: Not on file  Other Topics Concern   Not on file  Social History Narrative   ** Merged History Encounter **       Social Determinants of Health   Financial Resource Strain: Not on file  Food Insecurity: Not on file  Transportation Needs: Not on file  Physical Activity: Not on file  Stress: Not on file  Social Connections: Not on file     Family History  Problem Relation Age of Onset   Cancer Sister    Cancer Brother    Anesthesia problems Neg Hx    Hypotension Neg Hx    Malignant hyperthermia Neg Hx    Pseudochol deficiency Neg Hx     Outpatient Encounter Medications as of 05/21/2021  Medication Sig   Insulin NPH Human, Isophane, (NOVOLIN N [place]) Inject 14 Units into the skin 2 (two) times daily before a meal. Only if glucose is above 90 and she is eating   albuterol (VENTOLIN HFA) 108 (90 Base) MCG/ACT inhaler Inhale 2 puffs into the lungs every 6 (six) hours as needed for wheezing or shortness of breath.   aspirin 81 MG chewable tablet Chew 2 tablets (162 mg total) by mouth daily.   BD INSULIN SYRINGE U/F 31G X 5/16" 0.3 ML MISC Inject 1 each into the skin 2 (two) times daily. as directed   cyanocobalamin (,VITAMIN B-12,) 1000 MCG/ML injection Inject 1 mL into the muscle every 30 (thirty) days.   diclofenac Sodium (VOLTAREN) 1 % GEL Apply 4 g topically 4 (four) times daily as needed. Apply to bilateral knees   labetalol (NORMODYNE) 100 MG tablet Take 1 tablet (100 mg total) by mouth 2 (two) times daily. (Patient taking differently: Take 200 mg by mouth 2 (two) times daily.)   lidocaine-prilocaine (EMLA) cream Apply 1 application topically every Monday, Wednesday, and Friday.   multivitamin (RENA-VIT) TABS tablet Take 1 tablet by mouth at bedtime.   NON FORMULARY Diet: NAS, Cons CHO   sevelamer carbonate (RENVELA) 800 MG tablet Take 1 tablet (800 mg total) by mouth 2 (two) times daily before lunch and supper.   [DISCONTINUED] benzonatate (TESSALON) 100 MG capsule Take 1 capsule (100 mg total) by mouth 3 (three) times daily as needed for cough. (Patient not taking: No sig reported)   [DISCONTINUED] insulin lispro (HUMALOG) 100 UNIT/ML injection Inject 0.05 mLs (5 Units total) into the skin 3 (three) times daily before meals. For cbg >150 (Patient not taking: No sig reported)   [DISCONTINUED] insulin NPH Human (NOVOLIN N)  100 UNIT/ML injection Inject 0.07 mLs (7 Units total) into the skin 2 (two) times daily before a meal. (Patient taking differently: Inject 14 Units into the skin 2 (two) times daily before a meal.)   [DISCONTINUED] NON FORMULARY Oxygen @ 2L/min via [place] PRN to keep sat above 90%  Special Instructions: Document O2 sat qshift. As Needed; PRN 1, PRN 2, PRN 3, PRN 4, PRN 5, PRN  6 (Patient not taking: No sig reported)   [DISCONTINUED] omeprazole (PRILOSEC) 20 MG capsule Take 1 capsule (20 mg total) by mouth daily. (Patient not taking: Reported on 03/04/2021)   [DISCONTINUED] pentoxifylline (TRENTAL) 400 MG CR tablet Take 1 tablet (400 mg total) by mouth daily with breakfast. (Patient not taking: No sig reported)   [DISCONTINUED] polyethylene glycol (MIRALAX / GLYCOLAX) 17 g packet Take 17 g by mouth 2 (two) times daily as needed for mild constipation. (Patient not taking: No sig reported)   [DISCONTINUED] senna-docusate (SENOKOT-S) 8.6-50 MG tablet Take 1 tablet by mouth 2 (two) times daily as needed for moderate constipation. (Patient not taking: No sig reported)   No facility-administered encounter medications on file as of 05/21/2021.    ALLERGIES: Allergies  Allergen Reactions   Ambien [Zolpidem] Other (See Comments)    Hallucinations    Reglan [Metoclopramide] Other (See Comments)    "makes me crazy"    VACCINATION STATUS: Immunization History  Administered Date(s) Administered   Influenza Whole 03/18/2006   Influenza-Unspecified 03/13/2020   Moderna SARS-COV2 Booster Vaccination 06/20/2020   Moderna Sars-Covid-2 Vaccination 08/31/2019, 09/28/2019   Pneumococcal-Unspecified 03/16/2019    Diabetes She presents for her initial diabetic visit. She has type 2 diabetes mellitus. Onset time: Was diagnosed at approximate age of 70 years. There are no hypoglycemic associated symptoms. Pertinent negatives for hypoglycemia include no confusion, headaches, pallor or seizures. Associated symptoms  include blurred vision and fatigue. Pertinent negatives for diabetes include no chest pain, no polydipsia, no polyphagia and no polyuria. There are no hypoglycemic complications. 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 Current diabetic treatments: She is currently on Novolin 14 units twice daily. She is following a generally unhealthy diet. When asked about meal planning, she reported none. She never participates in exercise. Her home blood glucose trend is decreasing steadily. Her overall blood glucose range is 140-180 mg/dl. (She presents with a CGM device showing average blood glucose of 156 for the last 7 days, 170 for the last 14 days, clearly improving.  Her recent A1c was 8.5% on March 25, 2021.  She does not have recent hypoglycemia.  She is accompanied by her daughter who is supportive in her care.) An ACE inhibitor/angiotensin II receptor blocker is not being taken.  Hypertension Associated symptoms include blurred vision. Pertinent negatives include no chest pain, headaches, palpitations or shortness of breath. Risk factors for coronary artery disease include diabetes mellitus, obesity, post-menopausal state and sedentary lifestyle. Hypertensive end-organ damage includes kidney disease. Identifiable causes of hypertension include chronic renal disease.    Review of Systems  Constitutional:  Positive for fatigue. Negative for chills, fever and unexpected weight change.  HENT:  Negative for trouble swallowing and voice change.   Eyes:  Positive for blurred vision. Negative for visual disturbance.  Respiratory:  Negative for cough, shortness of breath and wheezing.   Cardiovascular:  Negative for chest pain, palpitations and leg swelling.  Gastrointestinal:  Negative for diarrhea, nausea and vomiting.  Endocrine: Negative for cold  intolerance, heat intolerance, polydipsia, polyphagia and polyuria.  Musculoskeletal:  Negative for arthralgias and myalgias.  Skin:  Negative for color change, pallor, rash and wound.  Neurological:  Negative for seizures and headaches.  Psychiatric/Behavioral:  Negative for confusion and suicidal ideas.    Objective:    Vitals with BMI 05/21/2021 03/16/2021 03/04/2021  Height 5\' 8"  - -  Weight 168 lbs 6 oz - -  BMI 13.24 - -  Systolic 401 027 253  Diastolic 68 66 70  Pulse 64 77 82    BP (!) 145/68   Pulse 64   Ht 5\' 8"  (1.727 m)   Wt 168 lb 6.4 oz (76.4 kg)   BMI 25.61 kg/m   Wt Readings from Last 3 Encounters:  05/21/21 168 lb 6.4 oz (76.4 kg)  11/22/20 153 lb 9.6 oz (69.7 kg)  11/21/20 153 lb 9.6 oz (69.7 kg)     Physical Exam Constitutional:      Appearance: She is well-developed.  HENT:     Head: Normocephalic and atraumatic.  Neck:     Thyroid: No thyromegaly.     Trachea: No tracheal deviation.  Cardiovascular:     Rate and Rhythm: Normal rate and regular rhythm.  Pulmonary:     Effort: Pulmonary effort is normal.  Abdominal:     Palpations: Abdomen is soft.     Tenderness: There is no abdominal tenderness. There is no guarding.  Musculoskeletal:        General: Normal range of motion.     Cervical back: Normal range of motion and neck supple.  Skin:    General: Skin is warm and dry.     Coloration: Skin is not pale.     Findings: No erythema or rash.  Neurological:     Mental Status: She is alert and oriented to person, place, and time.     Cranial Nerves: No cranial nerve deficit.     Coordination: Coordination normal.     Deep Tendon Reflexes: Reflexes are normal and symmetric.  Psychiatric:        Judgment: Judgment normal.      CMP ( most recent) CMP     Component Value Date/Time   NA 132 (L) 11/09/2020 1138   K 3.8 11/09/2020 1138   CL 93 (L) 11/09/2020 1138   CO2 29 11/09/2020 1138   GLUCOSE 163 (H) 11/09/2020 1138   BUN 11  11/09/2020 1138   CREATININE 3.08 (H) 11/09/2020 1138   CALCIUM 8.5 (L) 11/09/2020 1138   PROT 8.3 (H) 11/04/2020 0143   ALBUMIN 2.9 (L) 11/08/2020 0345   AST 31 11/04/2020 0143   ALT 27 11/04/2020 0143   ALKPHOS 87 11/04/2020 0143   BILITOT 1.6 (H) 11/04/2020 0143   GFRNONAA 15 (L) 11/09/2020 1138   GFRAA 5 (L) 01/16/2020 0650     Diabetic Labs (most recent): Lab Results  Component Value Date   HGBA1C 8.5 03/25/2021   HGBA1C 9.4 (H) 10/31/2020   HGBA1C 7.7 (H) 01/05/2020     Lipid Panel ( most recent) Lipid Panel     Component Value Date/Time   CHOL 116 07/21/2013 0720   TRIG 97 07/21/2013 0720   HDL 29 (L) 07/21/2013 0720   CHOLHDL 4.0 07/21/2013 0720   VLDL 19 07/21/2013 0720   LDLCALC 68 07/21/2013 0720      Lab Results  Component Value Date   TSH 1.342 08/19/2013   TSH 1.480 10/16/2006   TSH 1.821 02/26/2006      Assessment & Plan:   1. 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 Hyperlipidemia associated with type 2 diabetes mellitus ([place])  3. Hypertensive heart and CKD, ESRD on dialysis ([place])   - [name] has currently uncontrolled symptomatic type 2 DM since  80 years of age,  with most recent  A1c of 8.5 %. Recent labs reviewed. She presents with a CGM device showing average blood glucose of 156 for the last 7 days, 170 for the last 14 days, clearly improving.    She does not have recent hypoglycemia.  She is accompanied by her daughter who is supportive in her care. - I had a long discussion with her about the progressive nature of diabetes and the pathology behind its complications. -her diabetes is complicated by ESRD on hemodialysis, obesity/sedentary life and she remains at a high risk for more acute and chronic complications which include CAD, CVA, CKD, retinopathy, and neuropathy. These are all discussed in detail with her.  - I discussed all available options of managing her diabetes  including de-escalation of medications. I have counseled her on diet  and weight management  by adopting a Whole Food , Plant Predominant  ( WFPP) nutrition as recommended by [employer] of Lifestyle Medicine. Patient is encouraged to switch to  unprocessed or minimally processed  complex starch, adequate protein intake (mainly plant source), minimal liquid fat ( mainly vegetable oils), plenty of fruits, and vegetables. -  she is advised to stick to a routine mealtimes to eat 3 complete meals a day and snack only when necessary ( to snack only to correct hypoglycemia BG <70 day time or <100 at night).   - she acknowledges that there is a room for improvement in her food and drink choices. - Further Specific Suggestion is made for her to avoid simple carbohydrates  from her diet including Cakes, Sweet Desserts, Ice Cream, Soda (diet and regular), Sweet Tea, Candies, Chips, Cookies, Store Bought Juices, Alcohol in Excess of  1-2 drinks a day, Artificial Sweeteners,  Coffee Creamer, and "Sugar-free" Products. This will help patient to have more stable blood glucose profile and potentially avoid unintended weight gain.  - she will be scheduled with Jearld Fenton, RDN, CDE for individualized diabetes education.  - I have approached her with the following plan to manage  her diabetes and patient agrees:   -She has large supplies of her current insulin regimen, advised to continue Novolin 14 units twice daily with breakfast and supper.  After she finishes her current supplies, she will be considered for Novolin 70/30 twice daily with breakfast and supper.   -She is encouraged to use her CGM to monitor blood glucose at least 4 times a day-before meals and at bedtime.   - Adjustment parameters are given to her for hypo and hyperglycemia in writing. - she is encouraged to call clinic for blood glucose levels less than 70 or above 200 mg /dl. -Non-insulin options are not available for her.  She is being  cared for by her daughter who is accepting all treatment recommendations for her mother. - she will be considered for incretin therapy as appropriate next visit.  - Specific targets for  A1c;  LDL, HDL,  and Triglycerides were discussed with the patient.  2) Blood Pressure /Hypertension:  her blood pressure is  controlled to target.   she is advised to continue her current medications including labetalol 100 mg p.o. twice daily.    3) Lipids/Hyperlipidemia:   Review of her recent lipid panel showed  controlled  LDL at 68 .  she  is not on any antilipid treatment.     4)  Weight/Diet:  Body mass index is 25.61 kg/m.  -   she is not a candidate for weight loss. I discussed with her the fact that loss  of 5 - 10% of her  current body weight will have the most impact on her diabetes management.  The above detailed WFPP Nutrition will help manage weight as well. Optimal Exercise, Restorative Sleep  information was detailed on discharge instructions.  5) Chronic Care/Health Maintenance:  -she   is encouraged to initiate and continue to follow up with Ophthalmology, Dentist,  Podiatrist at least yearly or according to recommendations, and advised to   stay away from smoking. I have recommended yearly flu vaccine and pneumonia vaccine at least every 5 years; moderate intensity exercise for up to 150 minutes weekly; and  sleep for 7- 9 hours a day.  - she is  advised to maintain close follow up with [name], MD for primary care needs, as well as her other providers for optimal and coordinated care.   I spent 66 minutes in the care of the patient today including review of labs from [place], Lipids, Thyroid Function, Hematology (current and previous including abstractions from other facilities); face-to-face time discussing  her blood glucose readings/logs, discussing hypoglycemia and hyperglycemia episodes and symptoms, medications doses, her options of short and long term treatment based on the latest  standards of care / guidelines;  discussion about incorporating lifestyle medicine;  and documenting the encounter.     Please refer to Patient Instructions for Blood Glucose Monitoring and Insulin/Medications Dosing Guide"  in media tab for additional information. Please  also refer to " Patient Self Inventory" in the Media  tab for reviewed elements of pertinent patient history.  [name] participated in the discussions, expressed understanding, and voiced agreement with the above plans.  All questions were answered to her satisfaction. she is encouraged to contact clinic should she have any questions or concerns prior to her return visit.   Follow up plan: - Return in about 9 weeks (around 07/23/2021) for Bring Meter and Logs- A1c in Office.  Glade Lloyd, MD [facility] [address] [place], [place] [address] Phone: [phone]  Fax: [phone]    05/21/2021, 9:00 PM  This note was partially dictated with voice recognition software. Similar sounding words can be transcribed inadequately or may not  be corrected upon review.

## 2021-05-22 DIAGNOSIS — Z992 Dependence on renal dialysis: Secondary | ICD-10-CM | POA: Diagnosis not present

## 2021-05-22 DIAGNOSIS — N186 End stage renal disease: Secondary | ICD-10-CM | POA: Diagnosis not present

## 2021-05-24 DIAGNOSIS — N186 End stage renal disease: Secondary | ICD-10-CM | POA: Diagnosis not present

## 2021-05-24 DIAGNOSIS — Z992 Dependence on renal dialysis: Secondary | ICD-10-CM | POA: Diagnosis not present

## 2021-05-27 DIAGNOSIS — N186 End stage renal disease: Secondary | ICD-10-CM | POA: Diagnosis not present

## 2021-05-27 DIAGNOSIS — Z992 Dependence on renal dialysis: Secondary | ICD-10-CM | POA: Diagnosis not present

## 2021-05-27 DIAGNOSIS — N2581 Secondary hyperparathyroidism of renal origin: Secondary | ICD-10-CM | POA: Diagnosis not present

## 2021-05-29 DIAGNOSIS — N186 End stage renal disease: Secondary | ICD-10-CM | POA: Diagnosis not present

## 2021-05-29 DIAGNOSIS — Z992 Dependence on renal dialysis: Secondary | ICD-10-CM | POA: Diagnosis not present

## 2021-05-31 DIAGNOSIS — N186 End stage renal disease: Secondary | ICD-10-CM | POA: Diagnosis not present

## 2021-05-31 DIAGNOSIS — Z992 Dependence on renal dialysis: Secondary | ICD-10-CM | POA: Diagnosis not present

## 2021-06-03 DIAGNOSIS — N186 End stage renal disease: Secondary | ICD-10-CM | POA: Diagnosis not present

## 2021-06-03 DIAGNOSIS — Z992 Dependence on renal dialysis: Secondary | ICD-10-CM | POA: Diagnosis not present

## 2021-06-05 DIAGNOSIS — Z992 Dependence on renal dialysis: Secondary | ICD-10-CM | POA: Diagnosis not present

## 2021-06-05 DIAGNOSIS — N186 End stage renal disease: Secondary | ICD-10-CM | POA: Diagnosis not present

## 2021-06-07 DIAGNOSIS — Z992 Dependence on renal dialysis: Secondary | ICD-10-CM | POA: Diagnosis not present

## 2021-06-07 DIAGNOSIS — N186 End stage renal disease: Secondary | ICD-10-CM | POA: Diagnosis not present

## 2021-06-10 DIAGNOSIS — N186 End stage renal disease: Secondary | ICD-10-CM | POA: Diagnosis not present

## 2021-06-10 DIAGNOSIS — Z992 Dependence on renal dialysis: Secondary | ICD-10-CM | POA: Diagnosis not present

## 2021-06-12 DIAGNOSIS — Z992 Dependence on renal dialysis: Secondary | ICD-10-CM | POA: Diagnosis not present

## 2021-06-12 DIAGNOSIS — N186 End stage renal disease: Secondary | ICD-10-CM | POA: Diagnosis not present

## 2021-06-14 DIAGNOSIS — N186 End stage renal disease: Secondary | ICD-10-CM | POA: Diagnosis not present

## 2021-06-14 DIAGNOSIS — Z992 Dependence on renal dialysis: Secondary | ICD-10-CM | POA: Diagnosis not present

## 2021-06-15 DIAGNOSIS — Z992 Dependence on renal dialysis: Secondary | ICD-10-CM | POA: Diagnosis not present

## 2021-06-15 DIAGNOSIS — N186 End stage renal disease: Secondary | ICD-10-CM | POA: Diagnosis not present

## 2021-06-17 DIAGNOSIS — N186 End stage renal disease: Secondary | ICD-10-CM | POA: Diagnosis not present

## 2021-06-17 DIAGNOSIS — Z992 Dependence on renal dialysis: Secondary | ICD-10-CM | POA: Diagnosis not present

## 2021-06-17 DIAGNOSIS — E1122 Type 2 diabetes mellitus with diabetic chronic kidney disease: Secondary | ICD-10-CM | POA: Diagnosis not present

## 2021-06-19 DIAGNOSIS — N186 End stage renal disease: Secondary | ICD-10-CM | POA: Diagnosis not present

## 2021-06-19 DIAGNOSIS — Z992 Dependence on renal dialysis: Secondary | ICD-10-CM | POA: Diagnosis not present

## 2021-06-21 DIAGNOSIS — N186 End stage renal disease: Secondary | ICD-10-CM | POA: Diagnosis not present

## 2021-06-21 DIAGNOSIS — Z992 Dependence on renal dialysis: Secondary | ICD-10-CM | POA: Diagnosis not present

## 2021-06-24 DIAGNOSIS — D509 Iron deficiency anemia, unspecified: Secondary | ICD-10-CM | POA: Diagnosis not present

## 2021-06-24 DIAGNOSIS — Z992 Dependence on renal dialysis: Secondary | ICD-10-CM | POA: Diagnosis not present

## 2021-06-24 DIAGNOSIS — E119 Type 2 diabetes mellitus without complications: Secondary | ICD-10-CM | POA: Diagnosis not present

## 2021-06-24 DIAGNOSIS — N186 End stage renal disease: Secondary | ICD-10-CM | POA: Diagnosis not present

## 2021-06-24 DIAGNOSIS — N2581 Secondary hyperparathyroidism of renal origin: Secondary | ICD-10-CM | POA: Diagnosis not present

## 2021-06-25 ENCOUNTER — Ambulatory Visit: Payer: Medicare Other | Admitting: Family

## 2021-06-25 DIAGNOSIS — I10 Essential (primary) hypertension: Secondary | ICD-10-CM | POA: Diagnosis not present

## 2021-06-25 DIAGNOSIS — E1142 Type 2 diabetes mellitus with diabetic polyneuropathy: Secondary | ICD-10-CM | POA: Diagnosis not present

## 2021-06-25 DIAGNOSIS — E1122 Type 2 diabetes mellitus with diabetic chronic kidney disease: Secondary | ICD-10-CM | POA: Diagnosis not present

## 2021-06-25 DIAGNOSIS — N186 End stage renal disease: Secondary | ICD-10-CM | POA: Diagnosis not present

## 2021-06-25 DIAGNOSIS — M545 Low back pain, unspecified: Secondary | ICD-10-CM | POA: Diagnosis not present

## 2021-06-25 DIAGNOSIS — D510 Vitamin B12 deficiency anemia due to intrinsic factor deficiency: Secondary | ICD-10-CM | POA: Diagnosis not present

## 2021-06-25 DIAGNOSIS — E538 Deficiency of other specified B group vitamins: Secondary | ICD-10-CM | POA: Diagnosis not present

## 2021-06-26 DIAGNOSIS — N186 End stage renal disease: Secondary | ICD-10-CM | POA: Diagnosis not present

## 2021-06-26 DIAGNOSIS — Z992 Dependence on renal dialysis: Secondary | ICD-10-CM | POA: Diagnosis not present

## 2021-06-28 DIAGNOSIS — R2232 Localized swelling, mass and lump, left upper limb: Secondary | ICD-10-CM | POA: Diagnosis not present

## 2021-06-28 DIAGNOSIS — Z992 Dependence on renal dialysis: Secondary | ICD-10-CM | POA: Diagnosis not present

## 2021-06-28 DIAGNOSIS — N186 End stage renal disease: Secondary | ICD-10-CM | POA: Diagnosis not present

## 2021-06-28 DIAGNOSIS — I871 Compression of vein: Secondary | ICD-10-CM | POA: Diagnosis not present

## 2021-07-01 DIAGNOSIS — N186 End stage renal disease: Secondary | ICD-10-CM | POA: Diagnosis not present

## 2021-07-01 DIAGNOSIS — Z992 Dependence on renal dialysis: Secondary | ICD-10-CM | POA: Diagnosis not present

## 2021-07-02 ENCOUNTER — Other Ambulatory Visit: Payer: Self-pay

## 2021-07-02 ENCOUNTER — Ambulatory Visit (INDEPENDENT_AMBULATORY_CARE_PROVIDER_SITE_OTHER): Payer: Commercial Managed Care - HMO | Admitting: Family

## 2021-07-02 ENCOUNTER — Ambulatory Visit: Payer: Self-pay

## 2021-07-02 DIAGNOSIS — M5416 Radiculopathy, lumbar region: Secondary | ICD-10-CM

## 2021-07-02 MED ORDER — PREDNISONE 50 MG PO TABS: ORAL_TABLET | ORAL | 0 refills | Status: DC

## 2021-07-03 ENCOUNTER — Encounter: Payer: Self-pay | Admitting: Family

## 2021-07-03 DIAGNOSIS — N186 End stage renal disease: Secondary | ICD-10-CM | POA: Diagnosis not present

## 2021-07-03 DIAGNOSIS — Z992 Dependence on renal dialysis: Secondary | ICD-10-CM | POA: Diagnosis not present

## 2021-07-03 NOTE — Progress Notes (Signed)
Office Visit Note   Patient: [name]           Date of Birth: 05/21/1941           MRN: [id number] Visit Date: 07/02/2021              Requested by: [name], MD [address] [place],  [place] [address] PCP: [name], MD  Chief Complaint  Patient presents with   Right Foot - Follow-up   Left Foot - Follow-up       HPI: The patient is an 81 year old woman who presents today for routine foot evaluation.  There is also have onychomycotic nails.    Today she is also concerned for some shooting pain in the right foot this radiates over the dorsum of her foot along her surgical incision.  Has the sensation from her anterior knee down to the distal foot. this is associated with heaviness of the limb with walking and abnormal gait. Does have a history of back pain does not feel that this was related  Denies any weakness no red flag symptoms no recent falls or injuries Assessment & Plan: Visit Diagnoses:  1. Radiculopathy, lumbar region     Plan: Onychomycotic nails trimmed x9. follow-up in 3 months.  For the right-sided lumbar radiculopathy we will trial a prednisone burst if she does not get relief her daughter will check in with [place] in 1 week.  Follow-Up Instructions: Return in about 3 months (around 09/30/2021).   Back Exam   Tenderness  The patient is experiencing no tenderness.   Range of Motion  The patient has normal back ROM.  Muscle Strength  The patient has normal back strength.  Tests  Straight leg raise right: positive Straight leg raise left: negative  Other  Gait: abnormal      Patient is alert, oriented, no adenopathy, well-dressed, normal affect, normal respiratory effort.  On examination bilateral feet there is no edema no erythema no skin breakdown no impending breakdown she does not have any callus.  She does have thickened and discolored onychomycotic nails x9.  The patient is unable to safely trim her own nails. nails were  trimmed today x9 patient tolerated well.  Imaging: XR Lumbar Spine 2-3 Views  Result Date: 07/03/2021 Graphs of the lumbar spine show spondylosis without spondylolisthesis.  Spondylosis most significant level of L4 over L5 L5 over S1  No images are attached to the encounter.  Labs: Lab Results  Component Value Date   HGBA1C 8.5 03/25/2021   HGBA1C 9.4 (H) 10/31/2020   HGBA1C 7.7 (H) 01/05/2020   ESRSEDRATE 42 (H) 01/06/2020   CRP 20.3 (H) 01/06/2020   REPTSTATUS 11/07/2020 FINAL 11/02/2020   CULT  11/02/2020    NO GROWTH 5 DAYS Performed at [facility], [place] [address] [place], [place] [address]      Lab Results  Component Value Date   ALBUMIN 2.9 (L) 11/08/2020   ALBUMIN 3.1 (L) 11/07/2020   ALBUMIN 3.0 (L) 11/06/2020    Lab Results  Component Value Date   MG 1.9 06/26/2013   MG 1.7 01/25/2009   No results found for: VD25OH  No results found for: PREALBUMIN CBC EXTENDED Latest Ref Rng & Units 11/09/2020 11/08/2020 11/07/2020  WBC 4.0 - 10.5 K/uL 10.6(H) 10.5 15.5(H)  RBC 3.87 - 5.11 MIL/uL 3.71(L) 3.73(L) 3.90  HGB 12.0 - 15.0 g/dL 11.6(L) 11.6(L) 12.2  HCT 36.0 - 46.0 % 35.2(L) 34.4(L) 35.6(L)  PLT  150 - 400 K/uL 152 155 160  NEUTROABS 1.7 - 7.7 K/uL - 6.1 10.7(H)  LYMPHSABS 0.7 - 4.0 K/uL - 2.3 2.2     There is no height or weight on file to calculate BMI.  Orders:  Orders Placed This Encounter  Procedures   XR Lumbar Spine 2-3 Views    Meds ordered this encounter  Medications   predniSONE (DELTASONE) 50 MG tablet    Sig: Take one tablet by mouth once daily for 5 days.    Dispense:  5 tablet    Refill:  0      Procedures: No procedures performed  Clinical Data: No additional findings.  ROS:  All other systems negative, except as noted in the HPI. Review of Systems  Objective: Vital Signs: There were no vitals taken for this visit.  Specialty Comments:  No specialty comments available.  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 ([place]) 11/09/2020   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 ([place]) 11/09/2020   Chronic constipation 11/09/2020   Vitamin B 12 deficiency 11/09/2020   Acute respiratory failure with hypoxia ([place]) 10/26/2020   Elevated troponin 10/26/2020   Volume overload 10/26/2020   Hypertensive urgency 10/26/2020   History of CVA (cerebrovascular accident) 01/05/2020   Thrombocytopenia ([place]) 01/05/2020   History of colonic polyps 04/01/2018   Acute bronchitis 10/15/2017   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 ([place])    HCAP (healthcare-associated pneumonia) 05/29/2015   Hyperkalemia 05/29/2015   ESRD (end stage renal disease) on dialysis [facility])    Encounter for adequacy testing for hemodialysis ([place]) 01/17/2014   Mechanical complication of other vascular device, implant, and graft 01/17/2014   Disturbances of vision, late effect of stroke 11/03/2013   Right hemiparesis ([place]) 08/19/2013   CVA (cerebral infarction) 07/20/2013   Gastroparesis due to DM ([place]) 07/01/2013   HLD (hyperlipidemia) 05/20/2006   Hereditary and idiopathic peripheral neuropathy 05/20/2006   Hypertension associated with diabetes ([place]) 05/20/2006   Past Medical History:  Diagnosis Date   Anemia    Cataract    Chronic kidney disease    CVA (cerebral infarction)    Diabetes mellitus with ESRD (end-stage renal disease) ([place])    Type 2   Dialysis  patient ([place])    M, W, F   Fistula    R arm   GERD (gastroesophageal reflux disease)    Hypertension    Renal disorder    Shortness of breath    Stroke ([place])    right side weakness    Family History  Problem Relation Age of Onset   Cancer Sister    Cancer Brother    Anesthesia problems Neg Hx    Hypotension Neg Hx    Malignant hyperthermia Neg Hx    Pseudochol deficiency Neg Hx     Past Surgical History:  Procedure Laterality Date   AMPUTATION Right 01/13/2020   Procedure: AMPUTATION RIGHT FOOT 1ST RAY;  Surgeon: Newt Minion, MD;  Location: [place];  Service: Orthopedics;  Laterality: Right;   AV FISTULA PLACEMENT Right 09/08/2013   Procedure: CREATION OF RIGHT BRACHIAL CEPHALIC ARTERIOVENOUS FISTULA ;  Surgeon: Mal Misty, MD;  Location: [place];  Service: Vascular;  Laterality: Right;   [place] Right 01/26/2014   Procedure: Right Arm BASILIC VEIN TRANSPOSITION;  Surgeon: Mal Misty, MD;  Location: [name];  Service: Vascular;  Laterality: Right;   CATARACT EXTRACTION W/PHACO  11/20/2011   Procedure: CATARACT EXTRACTION PHACO AND INTRAOCULAR LENS PLACEMENT ([place]);  Surgeon: Tonny Branch, MD;  Location: [facility];  Service: Ophthalmology;  Laterality: Right;  CDE 18.82   CATARACT EXTRACTION W/PHACO Left 11/18/2012   Procedure: CATARACT EXTRACTION PHACO AND INTRAOCULAR LENS PLACEMENT ([facility]);  Surgeon: Tonny Branch, MD;  Location: [facility];  Service: Ophthalmology;  Laterality: Left;  CDE: 18.97   COLONOSCOPY N/A 02/09/2013   Procedure: COLONOSCOPY;  Surgeon: Rogene Houston, MD;  [facility];  Service: Endoscopy;  Laterality: N/A;  305-moved to 220 Ann to notify pt   COLONOSCOPY N/A 07/08/2018   Procedure: COLONOSCOPY;  Surgeon: Rogene Houston, MD;  [facility];  Service: Endoscopy;  Laterality: N/A;  930   INSERTION OF DIALYSIS CATHETER Right 06/24/2013   Procedure: INSERTION OF DIALYSIS CATHETER: Ultrasound guided;  Surgeon: Serafina Mitchell, MD;   Location: [place];  Service: Vascular;  Laterality: Right;   LIGATION OF ARTERIOVENOUS  FISTULA Right 01/26/2014   Procedure: LIGATION OF ARTERIOVENOUS  FISTULA;  Surgeon: Mal Misty, MD;  Location: [place];  Service: Vascular;  Laterality: Right;   LOOP RECORDER IMPLANT N/A 07/21/2013   Procedure: LOOP RECORDER IMPLANT;  Surgeon: Evans Lance, MD;  Location[facility];  Service: Cardiovascular;  Laterality: N/A;   POLYPECTOMY  07/08/2018   Procedure: POLYPECTOMY;  Surgeon: Rogene Houston, MD;  [facility];  Service: Endoscopy;;  Descending colon polyps x 2    TEE WITHOUT CARDIOVERSION N/A 07/21/2013   Procedure: TRANSESOPHAGEAL ECHOCARDIOGRAM (TEE);  Surgeon: Dorothy Spark, MD;  Location[facility];  Service: Cardiovascular;  Laterality: N/A;   Social History   Occupational History   Not on file  Tobacco Use   Smoking status: Never   Smokeless tobacco: Never  Vaping Use   Vaping Use: Never used  Substance and Sexual Activity   Alcohol use: No   Drug use: No   Sexual activity: Not on file

## 2021-07-05 DIAGNOSIS — N186 End stage renal disease: Secondary | ICD-10-CM | POA: Diagnosis not present

## 2021-07-05 DIAGNOSIS — Z992 Dependence on renal dialysis: Secondary | ICD-10-CM | POA: Diagnosis not present

## 2021-07-08 DIAGNOSIS — N186 End stage renal disease: Secondary | ICD-10-CM | POA: Diagnosis not present

## 2021-07-08 DIAGNOSIS — N2581 Secondary hyperparathyroidism of renal origin: Secondary | ICD-10-CM | POA: Diagnosis not present

## 2021-07-08 DIAGNOSIS — Z992 Dependence on renal dialysis: Secondary | ICD-10-CM | POA: Diagnosis not present

## 2021-07-10 DIAGNOSIS — N186 End stage renal disease: Secondary | ICD-10-CM | POA: Diagnosis not present

## 2021-07-10 DIAGNOSIS — Z992 Dependence on renal dialysis: Secondary | ICD-10-CM | POA: Diagnosis not present

## 2021-07-12 DIAGNOSIS — Z992 Dependence on renal dialysis: Secondary | ICD-10-CM | POA: Diagnosis not present

## 2021-07-12 DIAGNOSIS — N186 End stage renal disease: Secondary | ICD-10-CM | POA: Diagnosis not present

## 2021-07-14 DIAGNOSIS — E1122 Type 2 diabetes mellitus with diabetic chronic kidney disease: Secondary | ICD-10-CM | POA: Diagnosis not present

## 2021-07-14 DIAGNOSIS — I120 Hypertensive chronic kidney disease with stage 5 chronic kidney disease or end stage renal disease: Secondary | ICD-10-CM | POA: Diagnosis not present

## 2021-07-14 DIAGNOSIS — E519 Thiamine deficiency, unspecified: Secondary | ICD-10-CM | POA: Diagnosis not present

## 2021-07-14 DIAGNOSIS — N186 End stage renal disease: Secondary | ICD-10-CM | POA: Diagnosis not present

## 2021-07-15 DIAGNOSIS — Z992 Dependence on renal dialysis: Secondary | ICD-10-CM | POA: Diagnosis not present

## 2021-07-15 DIAGNOSIS — N186 End stage renal disease: Secondary | ICD-10-CM | POA: Diagnosis not present

## 2021-07-16 DIAGNOSIS — Z992 Dependence on renal dialysis: Secondary | ICD-10-CM | POA: Diagnosis not present

## 2021-07-16 DIAGNOSIS — N186 End stage renal disease: Secondary | ICD-10-CM | POA: Diagnosis not present

## 2021-07-17 DIAGNOSIS — N186 End stage renal disease: Secondary | ICD-10-CM | POA: Diagnosis not present

## 2021-07-17 DIAGNOSIS — Z992 Dependence on renal dialysis: Secondary | ICD-10-CM | POA: Diagnosis not present

## 2021-07-18 DIAGNOSIS — E1122 Type 2 diabetes mellitus with diabetic chronic kidney disease: Secondary | ICD-10-CM | POA: Diagnosis not present

## 2021-07-19 DIAGNOSIS — N186 End stage renal disease: Secondary | ICD-10-CM | POA: Diagnosis not present

## 2021-07-19 DIAGNOSIS — Z992 Dependence on renal dialysis: Secondary | ICD-10-CM | POA: Diagnosis not present

## 2021-07-22 DIAGNOSIS — N186 End stage renal disease: Secondary | ICD-10-CM | POA: Diagnosis not present

## 2021-07-22 DIAGNOSIS — Z992 Dependence on renal dialysis: Secondary | ICD-10-CM | POA: Diagnosis not present

## 2021-07-24 ENCOUNTER — Ambulatory Visit: Payer: Medicare Other | Admitting: "Endocrinology

## 2021-07-24 DIAGNOSIS — N186 End stage renal disease: Secondary | ICD-10-CM | POA: Diagnosis not present

## 2021-07-24 DIAGNOSIS — Z992 Dependence on renal dialysis: Secondary | ICD-10-CM | POA: Diagnosis not present

## 2021-07-25 ENCOUNTER — Encounter: Payer: Self-pay | Admitting: "Endocrinology

## 2021-07-25 ENCOUNTER — Ambulatory Visit (INDEPENDENT_AMBULATORY_CARE_PROVIDER_SITE_OTHER): Payer: Medicare Other | Admitting: "Endocrinology

## 2021-07-25 ENCOUNTER — Other Ambulatory Visit: Payer: Self-pay

## 2021-07-25 VITALS — BP 164/70 | HR 76 | Ht 68.0 in | Wt 162.0 lb

## 2021-07-25 DIAGNOSIS — N186 End stage renal disease: Secondary | ICD-10-CM | POA: Diagnosis not present

## 2021-07-25 DIAGNOSIS — E1122 Type 2 diabetes mellitus with diabetic chronic kidney disease: Secondary | ICD-10-CM | POA: Diagnosis not present

## 2021-07-25 DIAGNOSIS — I120 Hypertensive chronic kidney disease with stage 5 chronic kidney disease or end stage renal disease: Secondary | ICD-10-CM

## 2021-07-25 DIAGNOSIS — Z992 Dependence on renal dialysis: Secondary | ICD-10-CM

## 2021-07-25 LAB — POCT GLYCOSYLATED HEMOGLOBIN (HGB A1C): HbA1c, POC (controlled diabetic range): 8.7 % — AB (ref 0.0–7.0)

## 2021-07-25 MED ORDER — NOVOLIN 70/30 (70-30) 100 UNIT/ML ~~LOC~~ SUSP: 20.0000 [IU] | Freq: Two times a day (BID) | SUBCUTANEOUS | 1 refills | Status: DC

## 2021-07-25 NOTE — Patient Instructions (Signed)

## 2021-07-25 NOTE — Progress Notes (Signed)
07/25/2021, 11:47 AM  Endocrinology follow-up note   Subjective:    Patient ID: [name], female    DOB: May 26, 1941.  [name] is being seen in consultation for management of currently uncontrolled symptomatic diabetes requested by  [name], MD.   Past Medical History:  Diagnosis Date   Anemia    Cataract    Chronic kidney disease    CVA (cerebral infarction)    Diabetes mellitus with ESRD (end-stage renal disease) ([place])    Type 2   Dialysis patient ([place])    M, W, F   Fistula    R arm   GERD (gastroesophageal reflux disease)    Hypertension    Renal disorder    Shortness of breath    Stroke [facility])    right side weakness    Past Surgical History:  Procedure Laterality Date   AMPUTATION Right 01/13/2020   Procedure: AMPUTATION RIGHT FOOT 1ST RAY;  Surgeon: Newt Minion, MD;  Location: [place];  Service: Orthopedics;  Laterality: Right;   AV FISTULA PLACEMENT Right 09/08/2013   Procedure: CREATION OF RIGHT BRACHIAL CEPHALIC ARTERIOVENOUS FISTULA ;  Surgeon: Mal Misty, MD;  Location: [place];  Service: Vascular;  Laterality: Right;   [place] Right 01/26/2014   Procedure: Right Arm BASILIC VEIN TRANSPOSITION;  Surgeon: Mal Misty, MD;  Location: [place];  Service: Vascular;  Laterality: Right;   CATARACT EXTRACTION W/PHACO  11/20/2011   Procedure: CATARACT EXTRACTION PHACO AND INTRAOCULAR LENS PLACEMENT ([place]);  Surgeon: Tonny Branch, MD;  Location: [facility];  Service: Ophthalmology;  Laterality: Right;  CDE 18.82   CATARACT EXTRACTION W/PHACO Left 11/18/2012   Procedure: CATARACT EXTRACTION PHACO AND INTRAOCULAR LENS PLACEMENT ([facility]);  Surgeon: Tonny Branch, MD;  Location: [facility];  Service: Ophthalmology;  Laterality: Left;  CDE: 18.97   COLONOSCOPY N/A 02/09/2013   Procedure: COLONOSCOPY;  Surgeon: Rogene Houston, MD;  [facility];  Service: Endoscopy;  Laterality: N/A;  305-moved to 220 Ann to notify pt    COLONOSCOPY N/A 07/08/2018   Procedure: COLONOSCOPY;  Surgeon: Rogene Houston, MD;  [facility];  Service: Endoscopy;  Laterality: N/A;  930   INSERTION OF DIALYSIS CATHETER Right 06/24/2013   Procedure: INSERTION OF DIALYSIS CATHETER: Ultrasound guided;  Surgeon: Serafina Mitchell, MD;  Location: [place];  Service: Vascular;  Laterality: Right;   LIGATION OF ARTERIOVENOUS  FISTULA Right 01/26/2014   Procedure: LIGATION OF ARTERIOVENOUS  FISTULA;  Surgeon: Mal Misty, MD;  Location: [place];  Service: Vascular;  Laterality: Right;   LOOP RECORDER IMPLANT N/A 07/21/2013   Procedure: LOOP RECORDER IMPLANT;  Surgeon: Evans Lance, MD;  Location: [facility] CATH LAB;  Service: Cardiovascular;  Laterality: N/A;   POLYPECTOMY  07/08/2018   Procedure: POLYPECTOMY;  Surgeon: Rogene Houston, MD;  [facility];  Service: Endoscopy;;  Descending colon polyps x 2    TEE WITHOUT CARDIOVERSION N/A 07/21/2013   Procedure: TRANSESOPHAGEAL ECHOCARDIOGRAM (TEE);  Surgeon: Dorothy Spark, MD;  Location: [facility] ENDOSCOPY;  Service: Cardiovascular;  Laterality: N/A;    Social History   Socioeconomic History   Marital status: Widowed    Spouse name: Not on file   Number of children: Not on file   Years of education: Not on file   Highest education level: Not on file  Occupational History   Not on file  Tobacco Use   Smoking  status: Never   Smokeless tobacco: Never  Vaping Use   Vaping Use: Never used  Substance and Sexual Activity   Alcohol use: No   Drug use: No   Sexual activity: Not on file  Other Topics Concern   Not on file  Social History Narrative   ** Merged History Encounter **       Social Determinants of Health   Financial Resource Strain: Not on file  Food Insecurity: Not on file  Transportation Needs: Not on file  Physical Activity: Not on file  Stress: Not on file  Social Connections: Not on file    Family History  Problem Relation Age of Onset   Cancer Sister     Cancer Brother    Anesthesia problems Neg Hx    Hypotension Neg Hx    Malignant hyperthermia Neg Hx    Pseudochol deficiency Neg Hx     Outpatient Encounter Medications as of 07/25/2021  Medication Sig   insulin NPH-regular Human (NOVOLIN 70/30) (70-30) 100 UNIT/ML injection Inject 20 Units into the skin 2 (two) times daily with a meal.   albuterol (VENTOLIN HFA) 108 (90 Base) MCG/ACT inhaler Inhale 2 puffs into the lungs every 6 (six) hours as needed for wheezing or shortness of breath.   aspirin 81 MG chewable tablet Chew 2 tablets (162 mg total) by mouth daily.   BD INSULIN SYRINGE U/F 31G X 5/16" 0.3 ML MISC Inject 1 each into the skin 2 (two) times daily. as directed   cyanocobalamin (,VITAMIN B-12,) 1000 MCG/ML injection Inject 1 mL into the muscle every 30 (thirty) days.   diclofenac Sodium (VOLTAREN) 1 % GEL Apply 4 g topically 4 (four) times daily as needed. Apply to bilateral knees   labetalol (NORMODYNE) 100 MG tablet Take 1 tablet (100 mg total) by mouth 2 (two) times daily. (Patient taking differently: Take 200 mg by mouth 2 (two) times daily.)   lidocaine-prilocaine (EMLA) cream Apply 1 application topically every Monday, Wednesday, and Friday.   multivitamin (RENA-VIT) TABS tablet Take 1 tablet by mouth at bedtime.   NON FORMULARY Diet: NAS, Cons CHO   predniSONE (DELTASONE) 50 MG tablet Take one tablet by mouth once daily for 5 days.   sevelamer carbonate (RENVELA) 800 MG tablet Take 1 tablet (800 mg total) by mouth 2 (two) times daily before lunch and supper.   [DISCONTINUED] Insulin NPH Human, Isophane, (NOVOLIN N [place]) Inject 14 Units into the skin 2 (two) times daily before a meal. Only if glucose is above 90 and she is eating   No facility-administered encounter medications on file as of 07/25/2021.    ALLERGIES: Allergies  Allergen Reactions   Ambien [Zolpidem] Other (See Comments)    Hallucinations    Reglan [Metoclopramide] Other (See Comments)    "makes me crazy"     VACCINATION STATUS: Immunization History  Administered Date(s) Administered   Influenza Whole 03/18/2006   Influenza-Unspecified 03/13/2020   Moderna SARS-COV2 Booster Vaccination 06/20/2020   Moderna Sars-Covid-2 Vaccination 08/31/2019, 09/28/2019   Pneumococcal-Unspecified 03/16/2019    Diabetes She presents for her follow-up diabetic visit. She has type 2 diabetes mellitus. Onset time: Was diagnosed at approximate age of 25 years. Her disease course has been improving. There are no hypoglycemic associated symptoms. Pertinent negatives for hypoglycemia include no confusion, headaches, pallor or seizures. Associated symptoms include blurred vision and fatigue. Pertinent negatives for diabetes include no chest pain, no polydipsia, no polyphagia and no polyuria. There are no hypoglycemic complications. Symptoms are  improving. Diabetic complications include nephropathy. 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 Current diabetic treatments: She is currently on Novolin 14 units twice daily. Her weight is fluctuating minimally. She is following a generally unhealthy diet. When asked about meal planning, she reported none. She never participates in exercise. Her home blood glucose trend is decreasing steadily. Her breakfast blood glucose range is generally 140-180 mg/dl. Her lunch blood glucose range is generally 180-200 mg/dl. Her dinner blood glucose range is generally 180-200 mg/dl. Her bedtime blood glucose range is generally 180-200 mg/dl. Her overall blood glucose range is 180-200 mg/dl. (She presents with a CGM device showing average blood glucose of 191.  She has 49% time range, 51% above range.  She has no hypoglycemia.  Her point-of-Howard A1c is 8.7%, unchanged from 8.5% in October 2022.     She is accompanied by her daughter who is  supportive in her Howard.) An ACE inhibitor/angiotensin II receptor blocker is not being taken.  Hypertension This is a chronic problem. The current episode started more than 1 year ago. Associated symptoms include blurred vision. Pertinent negatives include no chest pain, headaches, palpitations or shortness of breath. Risk factors for coronary artery disease include diabetes mellitus, obesity, post-menopausal state and sedentary lifestyle. Hypertensive end-organ damage includes kidney disease. Identifiable causes of hypertension include chronic renal disease.    Review of Systems  Constitutional:  Positive for fatigue. Negative for chills, fever and unexpected weight change.  HENT:  Negative for trouble swallowing and voice change.   Eyes:  Positive for blurred vision. Negative for visual disturbance.  Respiratory:  Negative for cough, shortness of breath and wheezing.   Cardiovascular:  Negative for chest pain, palpitations and leg swelling.  Gastrointestinal:  Negative for diarrhea, nausea and vomiting.  Endocrine: Negative for cold intolerance, heat intolerance, polydipsia, polyphagia and polyuria.  Musculoskeletal:  Negative for arthralgias and myalgias.  Skin:  Negative for color change, pallor, rash and wound.  Neurological:  Negative for seizures and headaches.  Psychiatric/Behavioral:  Negative for confusion and suicidal ideas.    Objective:    Vitals with BMI 07/25/2021 05/21/2021 03/16/2021  Height 5\' 8"  5\' 8"  -  Weight 162 lbs 168 lbs 6 oz -  BMI 40.98 11.91 -  Systolic 478 295 621  Diastolic 70 68 66  Pulse 76 64 77    BP (!) 164/70    Pulse 76    Ht 5\' 8"  (1.727 m)    Wt 162 lb (73.5 kg)    BMI 24.63 kg/m   Wt Readings from Last 3 Encounters:  07/25/21 162 lb (73.5 kg)  05/21/21 168 lb 6.4 oz (76.4 kg)  11/22/20 153 lb 9.6 oz (69.7 kg)         CMP ( most recent) CMP     Component Value Date/Time   NA 132 (L) 11/09/2020 1138   K 3.8 11/09/2020 1138   CL 93 (L)  11/09/2020 1138   CO2 29 11/09/2020 1138   GLUCOSE 163 (H) 11/09/2020 1138   BUN 11 11/09/2020 1138   CREATININE 3.08 (H) 11/09/2020 1138   CALCIUM 8.5 (L) 11/09/2020 1138   PROT 8.3 (H) 11/04/2020 0143   ALBUMIN 2.9 (L) 11/08/2020 0345   AST 31 11/04/2020 0143   ALT 27 11/04/2020 0143   ALKPHOS 87 11/04/2020 0143   BILITOT 1.6 (H) 11/04/2020 0143   GFRNONAA 15 (L)  11/09/2020 1138   GFRAA 5 (L) 01/16/2020 0650     Diabetic Labs (most recent): Lab Results  Component Value Date   HGBA1C 8.7 (A) 07/25/2021   HGBA1C 8.5 03/25/2021   HGBA1C 9.4 (H) 10/31/2020     Lipid Panel ( most recent) Lipid Panel     Component Value Date/Time   CHOL 116 07/21/2013 0720   TRIG 97 07/21/2013 0720   HDL 29 (L) 07/21/2013 0720   CHOLHDL 4.0 07/21/2013 0720   VLDL 19 07/21/2013 0720   LDLCALC 68 07/21/2013 0720      Lab Results  Component Value Date   TSH 1.342 08/19/2013   TSH 1.480 10/16/2006   TSH 1.821 02/26/2006      Assessment & Plan:   1. 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 ([place]) 2. Hyperlipidemia associated with type 2 diabetes mellitus ([place]) 3. Hypertensive heart and CKD, ESRD on dialysis ([place])   - [name] has currently uncontrolled symptomatic type 2 DM since  81 years of age.  She presents with a CGM device showing average blood glucose of 191.  She has 49% time range, 51% above range.  She has no hypoglycemia.  Her point-of-Howard A1c is 8.7%, unchanged from 8.5% in October 2022.     She is accompanied by her daughter who is supportive in her Howard.  - I had a long discussion with her about the progressive nature of diabetes and the pathology behind its complications. -her diabetes is complicated by ESRD on hemodialysis, obesity/sedentary life and she remains at a high risk for more acute and chronic complications which include CAD, CVA, CKD, retinopathy, and neuropathy. These are all discussed in detail with  her.  - I discussed all available options of managing her diabetes including de-escalation of medications. I have counseled her on diet  and weight management  by adopting a Whole Food , Plant Predominant  ( WFPP) nutrition as recommended by [employer] of Lifestyle Medicine. Patient is encouraged to switch to  unprocessed or minimally processed  complex starch, adequate protein intake (mainly plant source), minimal liquid fat ( mainly vegetable oils), plenty of fruits, and vegetables. -  she is advised to stick to a routine mealtimes to eat 3 complete meals a day and snack only when necessary ( to snack only to correct hypoglycemia BG <70 day time or <100 at night).    - she acknowledges that there is a room for improvement in her food and drink choices. - Further Specific Suggestion is made for her to avoid simple carbohydrates  from her diet including Cakes, Sweet Desserts, Ice Cream, Soda (diet and regular), Sweet Tea, Candies, Chips, Cookies, Store Bought Juices, Alcohol in Excess of  1-2 drinks a day, Artificial Sweeteners,  Coffee Creamer, and "Sugar-free" Products. This will help patient to have more stable blood glucose profile and potentially avoid unintended weight gain.  - she will be scheduled with Jearld Fenton, RDN, CDE for individualized diabetes education.  - I have approached her with the following plan to manage  her diabetes and patient agrees:   -She will benefit from premixed insulin and instead of her NPH . She is advised to discontinue her Novolin and start Novolin 70/30 20 units with breakfast and 20 units with supper only if blood glucose readings above 90 mg per DL and only if she is eating.    She is encouraged to use her CGM continuously.    -She  is encouraged to use her CGM to monitor blood glucose at least 4 times a day-before meals and at bedtime.   - Adjustment parameters are given to her for hypo and hyperglycemia in writing. - she is encouraged to call  clinic for blood glucose levels less than 70 or above 200 mg /dl. -Non-insulin options are not available for her.  She is being cared for by her daughter who is accepting all treatment recommendations for her mother. - she will be considered for incretin therapy as appropriate next visit.  - Specific targets for  A1c;  LDL, HDL,  and Triglycerides were discussed with the patient.  2) Blood Pressure /Hypertension: Her blood pressure is not controlled to target.   she is advised to continue her current medications including labetalol 100 mg p.o. twice daily.  Blood pressure medication management is deferred to nephrology.  3) Lipids/Hyperlipidemia:   Review of her recent lipid panel showed  controlled  LDL at 68 .  she  is not on any antilipid treatment.  She will be considered for fasting lipid panel on subsequent visits.   4)  Weight/Diet:  Body mass index is 24.63 kg/m.  -   she is not a candidate for weight loss.  The above detailed WFPP Nutrition will help manage weight as well. Optimal Exercise, Restorative Sleep  information was detailed on discharge instructions.  5) Chronic Howard/Health Maintenance:  -she   is encouraged to initiate and continue to follow up with Ophthalmology, Dentist,  Podiatrist at least yearly or according to recommendations, and advised to   stay away from smoking. I have recommended yearly flu vaccine and pneumonia vaccine at least every 5 years; moderate intensity exercise for up to 150 minutes weekly; and  sleep for 7- 9 hours a day.  - she is  advised to maintain close follow up with [name], MD for primary Howard needs, as well as her other providers for optimal and coordinated Howard.   I spent 41 minutes in the Howard of the patient today including review of labs from [place], Lipids, Thyroid Function, Hematology (current and previous including abstractions from other facilities); face-to-face time discussing  her blood glucose readings/logs, discussing hypoglycemia  and hyperglycemia episodes and symptoms, medications doses, her options of short and long term treatment based on the latest standards of Howard / guidelines;  discussion about incorporating lifestyle medicine;  and documenting the encounter.    Please refer to Patient Instructions for Blood Glucose Monitoring and Insulin/Medications Dosing Guide"  in media tab for additional information. Please  also refer to " Patient Self Inventory" in the Media  tab for reviewed elements of pertinent patient history.  [name] participated in the discussions, expressed understanding, and voiced agreement with the above plans.  All questions were answered to her satisfaction. she is encouraged to contact clinic should she have any questions or concerns prior to her return visit.  Follow up plan: - Return in about 3 months (around 10/22/2021) for Bring Meter and Logs- A1c in Office.  Barbara Lloyd, MD [facility] [address] [place], [place] [address] Phone: [phone]  Fax: [phone]    07/25/2021, 11:47 AM  This note was partially dictated with voice recognition software. Similar sounding words can be transcribed inadequately or may not  be corrected upon review.

## 2021-07-26 DIAGNOSIS — Z992 Dependence on renal dialysis: Secondary | ICD-10-CM | POA: Diagnosis not present

## 2021-07-26 DIAGNOSIS — N186 End stage renal disease: Secondary | ICD-10-CM | POA: Diagnosis not present

## 2021-07-29 ENCOUNTER — Telehealth: Payer: Self-pay | Admitting: "Endocrinology

## 2021-07-29 DIAGNOSIS — Z992 Dependence on renal dialysis: Secondary | ICD-10-CM | POA: Diagnosis not present

## 2021-07-29 DIAGNOSIS — N186 End stage renal disease: Secondary | ICD-10-CM | POA: Diagnosis not present

## 2021-07-29 DIAGNOSIS — N2581 Secondary hyperparathyroidism of renal origin: Secondary | ICD-10-CM | POA: Diagnosis not present

## 2021-07-29 NOTE — Telephone Encounter (Signed)
Discussed with pt's daughter, understanding voiced. °

## 2021-07-29 NOTE — Telephone Encounter (Signed)
Pt daughter would like a call back from nurse to discuss the new insulin she was started on 2/9. [name] # [phone]

## 2021-07-29 NOTE — Telephone Encounter (Signed)
Pt's daughter states pt's BG drops into the 60s whenever they give her 20 units of her Novolin 70/30. States she's eating and she does not take it if her BG is 90 or below. She stated she was not with the pt this morning and was unable to give me her readings for the past 3 days.

## 2021-07-31 DIAGNOSIS — Z992 Dependence on renal dialysis: Secondary | ICD-10-CM | POA: Diagnosis not present

## 2021-07-31 DIAGNOSIS — N186 End stage renal disease: Secondary | ICD-10-CM | POA: Diagnosis not present

## 2021-07-31 NOTE — Telephone Encounter (Signed)
Pt daughter called back this morning.  2/13 8pm -- 51 -- 10pm 40  2/14 8am 90 --- 4pm 205 -- 830pm 194  2/15 61 (sometime after 830pm at night) Then at 3:30 this morning she woke her up to get ready for dialysis and it would not read her sugar was too low.   Requesting call back [phone]

## 2021-07-31 NOTE — Telephone Encounter (Signed)
Informed patient daughter, she will start this today.

## 2021-08-02 DIAGNOSIS — Z992 Dependence on renal dialysis: Secondary | ICD-10-CM | POA: Diagnosis not present

## 2021-08-02 DIAGNOSIS — N186 End stage renal disease: Secondary | ICD-10-CM | POA: Diagnosis not present

## 2021-08-02 NOTE — Telephone Encounter (Signed)
Pt daughter is calling with readings after changing insulin dosage.   2/15 930pm 188, 2am 61  2/16 830am 187, 745pm 257, 2am 61 --pt ate a sandwhich and had juice -- it was 153 at 330am. Pt is now at dialysis.

## 2021-08-02 NOTE — Telephone Encounter (Signed)
Discussed with pt's daughter, understanding voiced. °

## 2021-08-05 ENCOUNTER — Telehealth: Payer: Self-pay | Admitting: Family

## 2021-08-05 DIAGNOSIS — Z992 Dependence on renal dialysis: Secondary | ICD-10-CM | POA: Diagnosis not present

## 2021-08-05 DIAGNOSIS — E538 Deficiency of other specified B group vitamins: Secondary | ICD-10-CM | POA: Diagnosis not present

## 2021-08-05 DIAGNOSIS — N186 End stage renal disease: Secondary | ICD-10-CM | POA: Diagnosis not present

## 2021-08-05 DIAGNOSIS — D510 Vitamin B12 deficiency anemia due to intrinsic factor deficiency: Secondary | ICD-10-CM | POA: Diagnosis not present

## 2021-08-05 DIAGNOSIS — M5416 Radiculopathy, lumbar region: Secondary | ICD-10-CM

## 2021-08-05 NOTE — Telephone Encounter (Signed)
Pt's [name] called stating PA Erin prescribed prednisone and to call if meds didn't work for pt. Pt's daughter states the pills did not work. They are asking for a call back about this matter at [phone].

## 2021-08-05 NOTE — Telephone Encounter (Signed)
Pt was here on 07/02/21 for right sided lumbar pain.

## 2021-08-06 ENCOUNTER — Telehealth: Payer: Self-pay | Admitting: Family

## 2021-08-06 ENCOUNTER — Other Ambulatory Visit: Payer: Self-pay

## 2021-08-06 DIAGNOSIS — Z992 Dependence on renal dialysis: Secondary | ICD-10-CM

## 2021-08-06 DIAGNOSIS — M5416 Radiculopathy, lumbar region: Secondary | ICD-10-CM

## 2021-08-06 DIAGNOSIS — E1122 Type 2 diabetes mellitus with diabetic chronic kidney disease: Secondary | ICD-10-CM

## 2021-08-06 MED ORDER — INSULIN NPH ISOPHANE & REGULAR (70-30) 100 UNIT/ML ~~LOC~~ SUSP: 20.0000 [IU] | Freq: Two times a day (BID) | SUBCUTANEOUS | 2 refills | Status: DC

## 2021-08-06 NOTE — Telephone Encounter (Signed)
Pt's daughter asked if PA Junie Panning can call her before she calls in prescription. She claims she missed the call. Please call Priscilla at [phone]

## 2021-08-06 NOTE — Telephone Encounter (Signed)
Sending to [place], she has left for the day.

## 2021-08-07 DIAGNOSIS — Z992 Dependence on renal dialysis: Secondary | ICD-10-CM | POA: Diagnosis not present

## 2021-08-07 DIAGNOSIS — N186 End stage renal disease: Secondary | ICD-10-CM | POA: Diagnosis not present

## 2021-08-07 MED ORDER — TRAMADOL HCL 50 MG PO TABS: 50.0000 mg | ORAL_TABLET | Freq: Four times a day (QID) | ORAL | 0 refills | Status: DC | PRN

## 2021-08-09 DIAGNOSIS — Z992 Dependence on renal dialysis: Secondary | ICD-10-CM | POA: Diagnosis not present

## 2021-08-09 DIAGNOSIS — N186 End stage renal disease: Secondary | ICD-10-CM | POA: Diagnosis not present

## 2021-08-12 DIAGNOSIS — N2581 Secondary hyperparathyroidism of renal origin: Secondary | ICD-10-CM | POA: Diagnosis not present

## 2021-08-12 DIAGNOSIS — Z992 Dependence on renal dialysis: Secondary | ICD-10-CM | POA: Diagnosis not present

## 2021-08-12 DIAGNOSIS — N186 End stage renal disease: Secondary | ICD-10-CM | POA: Diagnosis not present

## 2021-08-13 DIAGNOSIS — I120 Hypertensive chronic kidney disease with stage 5 chronic kidney disease or end stage renal disease: Secondary | ICD-10-CM | POA: Diagnosis not present

## 2021-08-13 DIAGNOSIS — Z992 Dependence on renal dialysis: Secondary | ICD-10-CM | POA: Diagnosis not present

## 2021-08-13 DIAGNOSIS — N186 End stage renal disease: Secondary | ICD-10-CM | POA: Diagnosis not present

## 2021-08-13 DIAGNOSIS — E1122 Type 2 diabetes mellitus with diabetic chronic kidney disease: Secondary | ICD-10-CM | POA: Diagnosis not present

## 2021-08-13 DIAGNOSIS — E519 Thiamine deficiency, unspecified: Secondary | ICD-10-CM | POA: Diagnosis not present

## 2021-08-14 DIAGNOSIS — Z992 Dependence on renal dialysis: Secondary | ICD-10-CM | POA: Diagnosis not present

## 2021-08-14 DIAGNOSIS — N186 End stage renal disease: Secondary | ICD-10-CM | POA: Diagnosis not present

## 2021-08-16 DIAGNOSIS — N186 End stage renal disease: Secondary | ICD-10-CM | POA: Diagnosis not present

## 2021-08-16 DIAGNOSIS — Z992 Dependence on renal dialysis: Secondary | ICD-10-CM | POA: Diagnosis not present

## 2021-08-17 DIAGNOSIS — E1122 Type 2 diabetes mellitus with diabetic chronic kidney disease: Secondary | ICD-10-CM | POA: Diagnosis not present

## 2021-08-19 DIAGNOSIS — Z992 Dependence on renal dialysis: Secondary | ICD-10-CM | POA: Diagnosis not present

## 2021-08-19 DIAGNOSIS — N186 End stage renal disease: Secondary | ICD-10-CM | POA: Diagnosis not present

## 2021-08-21 DIAGNOSIS — Z992 Dependence on renal dialysis: Secondary | ICD-10-CM | POA: Diagnosis not present

## 2021-08-21 DIAGNOSIS — N186 End stage renal disease: Secondary | ICD-10-CM | POA: Diagnosis not present

## 2021-08-22 ENCOUNTER — Other Ambulatory Visit: Payer: Self-pay

## 2021-08-22 DIAGNOSIS — E1122 Type 2 diabetes mellitus with diabetic chronic kidney disease: Secondary | ICD-10-CM

## 2021-08-22 MED ORDER — INSULIN LISPRO PROT & LISPRO (75-25 MIX) 100 UNIT/ML KWIKPEN: 10.0000 [IU] | PEN_INJECTOR | Freq: Two times a day (BID) | SUBCUTANEOUS | 1 refills | Status: DC

## 2021-08-23 DIAGNOSIS — N186 End stage renal disease: Secondary | ICD-10-CM | POA: Diagnosis not present

## 2021-08-23 DIAGNOSIS — Z992 Dependence on renal dialysis: Secondary | ICD-10-CM | POA: Diagnosis not present

## 2021-08-24 ENCOUNTER — Ambulatory Visit
Admission: RE | Admit: 2021-08-24 | Discharge: 2021-08-24 | Disposition: A | Discharge status: Home / Self Care | Payer: Commercial Managed Care - HMO | Source: Ambulatory Visit | Attending: Family | Admitting: Family

## 2021-08-24 ENCOUNTER — Other Ambulatory Visit: Payer: Self-pay

## 2021-08-24 DIAGNOSIS — M5416 Radiculopathy, lumbar region: Secondary | ICD-10-CM

## 2021-08-24 DIAGNOSIS — M47816 Spondylosis without myelopathy or radiculopathy, lumbar region: Secondary | ICD-10-CM | POA: Diagnosis not present

## 2021-08-24 DIAGNOSIS — M48061 Spinal stenosis, lumbar region without neurogenic claudication: Secondary | ICD-10-CM | POA: Diagnosis not present

## 2021-08-24 DIAGNOSIS — M4807 Spinal stenosis, lumbosacral region: Secondary | ICD-10-CM | POA: Diagnosis not present

## 2021-08-24 DIAGNOSIS — M4316 Spondylolisthesis, lumbar region: Secondary | ICD-10-CM | POA: Diagnosis not present

## 2021-08-24 MED ORDER — GADOBENATE DIMEGLUMINE 529 MG/ML IV SOLN: 20.0000 mL | Freq: Once | INTRAVENOUS | Status: DC | PRN

## 2021-08-26 DIAGNOSIS — N186 End stage renal disease: Secondary | ICD-10-CM | POA: Diagnosis not present

## 2021-08-26 DIAGNOSIS — Z992 Dependence on renal dialysis: Secondary | ICD-10-CM | POA: Diagnosis not present

## 2021-08-28 DIAGNOSIS — N186 End stage renal disease: Secondary | ICD-10-CM | POA: Diagnosis not present

## 2021-08-28 DIAGNOSIS — Z992 Dependence on renal dialysis: Secondary | ICD-10-CM | POA: Diagnosis not present

## 2021-08-29 ENCOUNTER — Other Ambulatory Visit: Payer: Self-pay

## 2021-08-29 ENCOUNTER — Encounter: Payer: Self-pay | Admitting: Physical Medicine and Rehabilitation

## 2021-08-29 ENCOUNTER — Ambulatory Visit: Payer: Self-pay

## 2021-08-29 ENCOUNTER — Ambulatory Visit (INDEPENDENT_AMBULATORY_CARE_PROVIDER_SITE_OTHER): Payer: Medicare Other | Admitting: Physical Medicine and Rehabilitation

## 2021-08-29 VITALS — BP 110/61 | HR 77

## 2021-08-29 DIAGNOSIS — M5416 Radiculopathy, lumbar region: Secondary | ICD-10-CM | POA: Diagnosis not present

## 2021-08-29 DIAGNOSIS — M48062 Spinal stenosis, lumbar region with neurogenic claudication: Secondary | ICD-10-CM

## 2021-08-29 MED ORDER — METHYLPREDNISOLONE ACETATE 80 MG/ML IJ SUSP
80.0000 mg | Freq: Once | INTRAMUSCULAR | Status: AC
  Administered 2021-08-29: 80 mg

## 2021-08-29 NOTE — Patient Instructions (Signed)

## 2021-08-29 NOTE — Progress Notes (Signed)
Pt state lower back pain that travels to her right leg and foot. Pt state walking and trying to standing makes the pain worse. Pt state she takes pain meds to help ease her pain. ? ?Numeric Pain Rating Scale and Functional Assessment ?Average Pain 8 ? ? ?In the last MONTH (on 0-10 scale) has pain interfered with the following? ? ?1. General activity like being  able to carry out your everyday physical activities such as walking, climbing stairs, carrying groceries, or moving a chair?  ?Rating(10) ? ? ?+Driver, -BT, -Dye Allergies. ? ?

## 2021-08-30 DIAGNOSIS — N186 End stage renal disease: Secondary | ICD-10-CM | POA: Diagnosis not present

## 2021-08-30 DIAGNOSIS — Z992 Dependence on renal dialysis: Secondary | ICD-10-CM | POA: Diagnosis not present

## 2021-09-02 DIAGNOSIS — Z992 Dependence on renal dialysis: Secondary | ICD-10-CM | POA: Diagnosis not present

## 2021-09-02 DIAGNOSIS — N186 End stage renal disease: Secondary | ICD-10-CM | POA: Diagnosis not present

## 2021-09-04 DIAGNOSIS — N186 End stage renal disease: Secondary | ICD-10-CM | POA: Diagnosis not present

## 2021-09-04 DIAGNOSIS — Z992 Dependence on renal dialysis: Secondary | ICD-10-CM | POA: Diagnosis not present

## 2021-09-06 DIAGNOSIS — N186 End stage renal disease: Secondary | ICD-10-CM | POA: Diagnosis not present

## 2021-09-06 DIAGNOSIS — Z992 Dependence on renal dialysis: Secondary | ICD-10-CM | POA: Diagnosis not present

## 2021-09-09 DIAGNOSIS — N186 End stage renal disease: Secondary | ICD-10-CM | POA: Diagnosis not present

## 2021-09-09 DIAGNOSIS — Z992 Dependence on renal dialysis: Secondary | ICD-10-CM | POA: Diagnosis not present

## 2021-09-10 ENCOUNTER — Encounter (HOSPITAL_COMMUNITY)
Admission: EM | Disposition: A | Discharge status: Home / Self Care | Payer: Self-pay | Source: Home / Self Care | Attending: Emergency Medicine

## 2021-09-10 ENCOUNTER — Emergency Department (HOSPITAL_COMMUNITY): Payer: Medicare Other | Admitting: Anesthesiology

## 2021-09-10 ENCOUNTER — Emergency Department (HOSPITAL_COMMUNITY): Payer: Medicare Other

## 2021-09-10 ENCOUNTER — Other Ambulatory Visit (INDEPENDENT_AMBULATORY_CARE_PROVIDER_SITE_OTHER): Payer: Self-pay | Admitting: Gastroenterology

## 2021-09-10 ENCOUNTER — Encounter (HOSPITAL_COMMUNITY): Payer: Self-pay | Admitting: Emergency Medicine

## 2021-09-10 ENCOUNTER — Other Ambulatory Visit: Payer: Self-pay

## 2021-09-10 ENCOUNTER — Ambulatory Visit (HOSPITAL_COMMUNITY)
Admission: EM | Admit: 2021-09-10 | Discharge: 2021-09-10 | Disposition: A | Discharge status: Home / Self Care | Payer: Medicare Other | Attending: Emergency Medicine | Admitting: Emergency Medicine

## 2021-09-10 DIAGNOSIS — K449 Diaphragmatic hernia without obstruction or gangrene: Secondary | ICD-10-CM | POA: Diagnosis not present

## 2021-09-10 DIAGNOSIS — I120 Hypertensive chronic kidney disease with stage 5 chronic kidney disease or end stage renal disease: Secondary | ICD-10-CM | POA: Diagnosis not present

## 2021-09-10 DIAGNOSIS — K644 Residual hemorrhoidal skin tags: Secondary | ICD-10-CM | POA: Diagnosis not present

## 2021-09-10 DIAGNOSIS — K2950 Unspecified chronic gastritis without bleeding: Secondary | ICD-10-CM | POA: Diagnosis not present

## 2021-09-10 DIAGNOSIS — T18128A Food in esophagus causing other injury, initial encounter: Secondary | ICD-10-CM | POA: Insufficient documentation

## 2021-09-10 DIAGNOSIS — D3A8 Other benign neuroendocrine tumors: Secondary | ICD-10-CM | POA: Insufficient documentation

## 2021-09-10 DIAGNOSIS — K219 Gastro-esophageal reflux disease without esophagitis: Secondary | ICD-10-CM | POA: Insufficient documentation

## 2021-09-10 DIAGNOSIS — K259 Gastric ulcer, unspecified as acute or chronic, without hemorrhage or perforation: Secondary | ICD-10-CM | POA: Insufficient documentation

## 2021-09-10 DIAGNOSIS — K3189 Other diseases of stomach and duodenum: Secondary | ICD-10-CM | POA: Diagnosis not present

## 2021-09-10 DIAGNOSIS — Z8673 Personal history of transient ischemic attack (TIA), and cerebral infarction without residual deficits: Secondary | ICD-10-CM | POA: Insufficient documentation

## 2021-09-10 DIAGNOSIS — X58XXXA Exposure to other specified factors, initial encounter: Secondary | ICD-10-CM | POA: Insufficient documentation

## 2021-09-10 DIAGNOSIS — Z86010 Personal history of colonic polyps: Secondary | ICD-10-CM | POA: Insufficient documentation

## 2021-09-10 DIAGNOSIS — N186 End stage renal disease: Secondary | ICD-10-CM | POA: Insufficient documentation

## 2021-09-10 DIAGNOSIS — K222 Esophageal obstruction: Secondary | ICD-10-CM | POA: Insufficient documentation

## 2021-09-10 DIAGNOSIS — T18108A Unspecified foreign body in esophagus causing other injury, initial encounter: Secondary | ICD-10-CM | POA: Diagnosis not present

## 2021-09-10 DIAGNOSIS — R52 Pain, unspecified: Secondary | ICD-10-CM | POA: Diagnosis not present

## 2021-09-10 DIAGNOSIS — K319 Disease of stomach and duodenum, unspecified: Secondary | ICD-10-CM | POA: Diagnosis not present

## 2021-09-10 DIAGNOSIS — D649 Anemia, unspecified: Secondary | ICD-10-CM | POA: Diagnosis not present

## 2021-09-10 DIAGNOSIS — R070 Pain in throat: Secondary | ICD-10-CM | POA: Diagnosis not present

## 2021-09-10 DIAGNOSIS — E1122 Type 2 diabetes mellitus with diabetic chronic kidney disease: Secondary | ICD-10-CM | POA: Insufficient documentation

## 2021-09-10 DIAGNOSIS — I517 Cardiomegaly: Secondary | ICD-10-CM | POA: Diagnosis not present

## 2021-09-10 DIAGNOSIS — Z992 Dependence on renal dialysis: Secondary | ICD-10-CM | POA: Diagnosis not present

## 2021-09-10 DIAGNOSIS — K2970 Gastritis, unspecified, without bleeding: Secondary | ICD-10-CM

## 2021-09-10 DIAGNOSIS — D3A010 Benign carcinoid tumor of the duodenum: Secondary | ICD-10-CM | POA: Diagnosis not present

## 2021-09-10 HISTORY — PX: ESOPHAGOGASTRODUODENOSCOPY (EGD) WITH PROPOFOL: SHX5813

## 2021-09-10 HISTORY — PX: BIOPSY: SHX5522

## 2021-09-10 LAB — BASIC METABOLIC PANEL
Anion gap: 8 (ref 5–15)
BUN: 42 mg/dL — ABNORMAL HIGH (ref 8–23)
CO2: 28 mmol/L (ref 22–32)
Calcium: 8 mg/dL — ABNORMAL LOW (ref 8.9–10.3)
Chloride: 103 mmol/L (ref 98–111)
Creatinine, Ser: 6.29 mg/dL — ABNORMAL HIGH (ref 0.44–1.00)
GFR, Estimated: 6 mL/min — ABNORMAL LOW (ref >60)
Glucose, Bld: 163 mg/dL — ABNORMAL HIGH (ref 70–99)
Potassium: 4.9 mmol/L (ref 3.5–5.1)
Sodium: 139 mmol/L (ref 135–145)

## 2021-09-10 LAB — CBC WITH DIFFERENTIAL/PLATELET
Abs Immature Granulocytes: 0.03 10*3/uL (ref 0.00–0.07)
Basophils Absolute: 0 10*3/uL (ref 0.0–0.1)
Basophils Relative: 0 %
Eosinophils Absolute: 0.1 10*3/uL (ref 0.0–0.5)
Eosinophils Relative: 1 %
HCT: 31.7 % — ABNORMAL LOW (ref 36.0–46.0)
Hemoglobin: 9.9 g/dL — ABNORMAL LOW (ref 12.0–15.0)
Immature Granulocytes: 0 %
Lymphocytes Relative: 14 %
Lymphs Abs: 1.1 10*3/uL (ref 0.7–4.0)
MCH: 30 pg (ref 26.0–34.0)
MCHC: 31.2 g/dL (ref 30.0–36.0)
MCV: 96.1 fL (ref 80.0–100.0)
Monocytes Absolute: 1 10*3/uL (ref 0.1–1.0)
Monocytes Relative: 12 %
Neutro Abs: 5.6 10*3/uL (ref 1.7–7.7)
Neutrophils Relative %: 73 %
Platelets: 91 10*3/uL — ABNORMAL LOW (ref 150–400)
RBC: 3.3 MIL/uL — ABNORMAL LOW (ref 3.87–5.11)
RDW: 16.2 % — ABNORMAL HIGH (ref 11.5–15.5)
WBC: 7.8 10*3/uL (ref 4.0–10.5)
nRBC: 0 % (ref 0.0–0.2)

## 2021-09-10 SURGERY — ESOPHAGOGASTRODUODENOSCOPY (EGD) WITH PROPOFOL
Anesthesia: General

## 2021-09-10 MED ORDER — SODIUM CHLORIDE 0.9 % IV SOLN: INTRAVENOUS | Status: DC

## 2021-09-10 MED ORDER — OMEPRAZOLE 40 MG PO CPDR: 40.0000 mg | DELAYED_RELEASE_CAPSULE | Freq: Every day | ORAL | 3 refills | Status: DC

## 2021-09-10 MED ORDER — GLUCAGON HCL RDNA (DIAGNOSTIC) 1 MG IJ SOLR
1.0000 mg | Freq: Once | INTRAMUSCULAR | Status: AC
  Administered 2021-09-10: 1 mg via INTRAVENOUS
  Filled 2021-09-10: qty 1

## 2021-09-10 MED ORDER — PROPOFOL 10 MG/ML IV BOLUS
INTRAVENOUS | Status: DC | PRN
Start: 2021-09-10 — End: 2021-09-10
  Administered 2021-09-10: 160 mg via INTRAVENOUS

## 2021-09-10 MED ORDER — NITROGLYCERIN 0.4 MG SL SUBL
0.4000 mg | SUBLINGUAL_TABLET | Freq: Once | SUBLINGUAL | Status: AC
  Administered 2021-09-10: 0.4 mg via SUBLINGUAL
  Filled 2021-09-10: qty 1

## 2021-09-10 NOTE — Transfer of Care (Signed)
Immediate Anesthesia Transfer of Care Note ? ?Patient: [name] ? ?Procedure(s) Performed: ESOPHAGOGASTRODUODENOSCOPY (EGD) WITH PROPOFOL ?BIOPSY ? ?Patient Location: PACU ? ?Anesthesia Type:General ? ?Level of Consciousness: awake and alert  ? ?Airway & Oxygen Therapy: Patient Spontanous Breathing ? ?Post-op Assessment: Report given to RN and Post -op Vital signs reviewed and stable ? ?Post vital signs: Reviewed and stable ? ?Last Vitals:  ?Vitals Value Taken Time  ?BP    ?Temp    ?Pulse    ?Resp    ?SpO2    ? ? ?Last Pain:  ?Vitals:  ? 09/10/21 1655  ?TempSrc:   ?PainSc: 0-No pain  ?   ? ?Patients Stated Pain Goal: 0 (09/10/21 1122) ? ?Complications: No notable events documented. ?

## 2021-09-10 NOTE — Procedures (Signed)
Lumbosacral Transforaminal Epidural Steroid Injection - Sub-Pedicular Approach with Fluoroscopic Guidance ? ?Patient: [name]      ?Date of Birth: [birth year] ?MRN: [id number] ?PCP: [name], MD      ?Visit Date: 08/29/2021 ?  ?Universal Protocol:    ?Date/Time: 08/29/2021 ? ?Consent Given By: the patient ? ?Position: PRONE ? ?Additional Comments: ?Vital signs were monitored before and after the procedure. ?Patient was prepped and draped in the usual sterile fashion. ?The correct patient, procedure, and site was verified. ? ? ?Injection Procedure Details:  ? ?Procedure diagnoses: Radiculopathy, lumbar region [M54.16]   ? ?Meds Administered:  ?Meds ordered this encounter  ?Medications  ? methylPREDNISolone acetate (DEPO-MEDROL) injection 80 mg  ? ? ?Laterality: Right ? ?Location/Site: L5 ? ?Needle:5.0 in., 22 ga.  Short bevel or Quincke spinal needle ? ?Needle Placement: Transforaminal ? ?Findings: ?  ? -Comments: Excellent flow of contrast along the nerve, nerve root and into the epidural space. ? ?Procedure Details: ?After squaring off the end-plates to get a true AP view, the C-arm was positioned so that an oblique view of the foramen as noted above was visualized. The target area is just inferior to the "nose of the scotty dog" or sub pedicular. The soft tissues overlying this structure were infiltrated with 2-3 ml. of 1% Lidocaine without Epinephrine. ? ?The spinal needle was inserted toward the target using a "trajectory" view along the fluoroscope beam.  Under AP and lateral visualization, the needle was advanced so it did not puncture dura and was located close the 6 O'Clock position of the pedical in AP tracterory. Biplanar projections were used to confirm position. Aspiration was confirmed to be negative for CSF and/or blood. A 1-2 ml. volume of Isovue-250 was injected and flow of contrast was noted at each level. Radiographs were obtained for documentation purposes.  ? ?After attaining the  desired flow of contrast documented above, a 0.5 to 1.0 ml test dose of 0.25% Marcaine was injected into each respective transforaminal space.  The patient was observed for 90 seconds post injection.  After no sensory deficits were reported, and normal lower extremity motor function was noted,   the above injectate was administered so that equal amounts of the injectate were placed at each foramen (level) into the transforaminal epidural space. ? ? ?Additional Comments:  ?The patient tolerated the procedure well ?Dressing: 2 x 2 sterile gauze and Band-Aid ?  ? ?Post-procedure details: ?Patient was observed during the procedure. ?Post-procedure instructions were reviewed. ? ?Patient left the clinic in stable condition. ? ?

## 2021-09-10 NOTE — Op Note (Signed)
[facility] ?Patient Name: [name] ?Procedure Date: 09/10/2021 4:52 PM ?MRN: [id number] ?Date of Birth: 06/30/1940 ?Attending MD: Maylon Peppers ,  ?CSN: [id number] ?Age: 81 ?Admit Type: Inpatient ?Procedure:                Upper GI endoscopy ?Indications:              Foreign body in the esophagus ?Providers:                Maylon Peppers, Lurline Del, RN, Wynonia Musty  ?                          Tech, Technician ?Referring MD:              ?Medicines:                Monitored Anesthesia Care ?Complications:            No immediate complications. ?Estimated Blood Loss:     Estimated blood loss: none. ?Procedure:                Pre-Anesthesia Assessment: ?                          - Prior to the procedure, a History and Physical  ?                          was performed, and patient medications, allergies  ?                          and sensitivities were reviewed. The patient's  ?                          tolerance of previous anesthesia was reviewed. ?                          - The risks and benefits of the procedure and the  ?                          sedation options and risks were discussed with the  ?                          patient. All questions were answered and informed  ?                          consent was obtained. ?                          - ASA Grade Assessment: III - A patient with severe  ?                          systemic disease. ?                          After obtaining informed consent, the endoscope was  ?                          passed under direct vision. Throughout the  ?  procedure, the patient's blood pressure, pulse, and  ?                          oxygen saturations were monitored continuously. The  ?                          GIF-H190 ([id number]) scope was introduced through the  ?                          mouth, and advanced to the second part of duodenum.  ?                          The upper GI endoscopy was accomplished without  ?                           difficulty. The patient tolerated the procedure  ?                          well. ?Scope In: 5:07:04 PM ?Scope Out: 5:25:29 PM ?Total Procedure Duration: 0 hours 18 minutes 25 seconds  ?Findings: ?     A non-obstructing Schatzki ring was found at the gastroesophageal  ?     junction. This was initially disrupted with a forceps. A TTS dilator was  ?     passed through the scope. Dilation with a 15-16.5-18 mm balloon dilator  ?     was performed to 18 mm. mucosal disruption was seen upon reinspection. ?     No food was present in the esophagus. ?     A 2 cm hiatal hernia was present. ?     Diffuse atrophic mucosa was found in the entire examined stomach. ?     Segmental moderate inflammation characterized by erythema was found in  ?     the gastric antrum. Biopsies were taken with a cold forceps for  ?     Helicobacter pylori testing. ?     Few non-bleeding superficial gastric ulcers with a clean ulcer base  ?     (Forrest Class III) were found in the gastric antrum. The largest lesion  ?     was 5 mm in largest dimension. ?     A single 5 mm mucosal nodule with a localized distribution was found in  ?     the duodenal bulb. Biopsies were taken with a cold forceps for histology. ?     The exam of the duodenum was otherwise normal. ?Impression:               - Non-obstructing Schatzki ring. Dilated. ?                          - 2 cm hiatal hernia. ?                          - Gastric mucosal atrophy. ?                          - Gastritis. Biopsied. ?                          -  Non-bleeding gastric ulcers with a clean ulcer  ?                          base (Forrest Class III). ?                          - Mucosal nodule found in the duodenum. Biopsied. ?Moderate Sedation: ?     Per Anesthesia Care ?Recommendation:           - Discharge patient to home (ambulatory). ?                          - Resume previous diet. ?                          - Await pathology results. ?                          - Use  Prilosec (omeprazole) 40 mg PO daily  ?                          indefinitely. ?                          - Return to clinic in 3-4 weeks. ?Procedure Code(s):        --- Professional --- ?                          [phone], Esophagogastroduodenoscopy, flexible,  ?                          transoral; with transendoscopic balloon dilation of  ?                          esophagus (less than 30 mm diameter) ?                          43239, 59, Esophagogastroduodenoscopy, flexible,  ?                          transoral; with biopsy, single or multiple ?Diagnosis Code(s):        --- Professional --- ?                          K22.2, Esophageal obstruction ?                          K44.9, Diaphragmatic hernia without obstruction or  ?                          gangrene ?                          K31.89, Other diseases of stomach and duodenum ?                          K29.70, Gastritis, unspecified, without bleeding ?  K25.9, Gastric ulcer, unspecified as acute or  ?                          chronic, without hemorrhage or perforation ?                          T18.108A, Unspecified foreign body in esophagus  ?                          causing other injury, initial encounter ?CPT copyright 2019 [employer]. All rights reserved. ?The codes documented in this report are preliminary and upon coder review may  ?be revised to meet current compliance requirements. ?Maylon Peppers, MD ?Maylon Peppers,  ?09/10/2021 5:37:19 PM ?This report has been signed electronically. ?Number of Addenda: 0 ?

## 2021-09-10 NOTE — Consult Note (Addendum)
? ?Gastroenterology Consult  ? ?Referring Provider: No ref. provider found ?Primary Care Physician:  [name], MD ?Primary Gastroenterologist: Dr. Laural Golden ? ?Patient ID: [name]; [id number]; 1941/04/26  ? ?Admit date: 09/10/2021 ? LOS: 0 days  ? ?Date of Consultation: 09/10/2021 ? ?Reason for Consultation:  food impaction ? ?History of Present Illness  ? ?[name] is a 81 y.o. 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 The patient reports he was eating eggs, bacon, and grits this morning and he felt the food got stuck in the retrosternal area. ? ?Drooling: No ?Able to swallow food:no ?Able to drink liquids: no ?Previous reflux symptoms:yes ?Weight changes:no ?Seasonal allegies: no ? ?Previous episodes: None ? ?Medications currently used: no reflux medications. Not on blood thinners. ASA 81 mg, labetalol '200mg'$  BID, insulin, renvela, multivitamin ? ?Previous EGD: Never ? ?Previous colonoscopy: 07/08/2018 - diverticulosis, two 5-65m polyps in proximal descending colon, external hemorrhoids ?  ?In the ER, his vital signs were stable, BP 164/80, HR 73, RR 22, SpO2 98%. She was protecting her airway Labs were notable for Hgb 9.9, Plts 91, glucose 163, BUN 42, Creatinine 6.29. She had CXR completed.  ? ? ? ? ?Past Medical History:  ?Diagnosis Date  ? Anemia   ? Cataract   ? Chronic kidney disease   ? CVA (cerebral infarction)   ? [place]   ? Type 2  ? [facility]ialysis patient [facility]   ? M, W, F  ? Fistula   ? R arm  ? GERD (gastroesophageal reflux disease)   ? Hypertension   ? Renal disorder   ? Shortness of breath   ? Stroke ([facility]   ? right side weakness  ? ? ?Past Surgical History:  ?Procedure Laterality Date  ? AMPUTATION Right 01/13/2020  ? Procedure: AMPUTATION RIGHT FOOT 1ST RAY;  Surgeon: DNewt Minion MD;   [place]  Service: Orthopedics;  Laterality: Right;  ? AV FISTULA PLACEMENT Right 09/08/2013  ? Procedure: CREATION OF RIGHT BRACHIAL CEPHALIC ARTERIOVENOUS FISTULA ;  Surgeon: JMal Misty MD;  [place]  Service: Vascular;  Laterality: Right;  ? BASCILIC VEIN TRANSPOSITION Right 01/26/2014  ? Procedure: Right Arm BASILIC VEIN TRANSPOSITION;  Surgeon: JMal Misty MD;  [place]  Service: Vascular;  Laterality: Right;  ? CATARACT EXTRACTION W/PHACO  11/20/2011  ? Procedure: CATARACT EXTRACTION PHACO AND INTRAOCULAR LENS PLACEMENT (IOC);  Surgeon: KTonny Branch MD;  Location: [facility];  Service: Ophthalmology;  Laterality: Right;  CDE 18.82  ? CATARACT EXTRACTION W/PHACO Left 11/18/2012  ? Procedure: CATARACT EXTRACTION PHACO AND INTRAOCULAR LENS PLACEMENT (IOC);  Surgeon: KTonny Branch MD;  Location: [facility];  Service: Ophthalmology;  Laterality: Left;  CDE: 18.97  ? COLONOSCOPY N/A 02/09/2013  ? Procedure: COLONOSCOPY;  Surgeon: NRogene Houston MD;  [facility];  Service: Endoscopy;  Laterality: N/A;  305-moved to 220 Ann to notify pt  ? COLONOSCOPY N/A 07/08/2018  ? Procedure: COLONOSCOPY;  Surgeon: RRogene Houston MD;  [facility];  Service: Endoscopy;  Laterality: N/A;  930  ? INSERTION OF DIALYSIS CATHETER Right 06/24/2013  ? Procedure: INSERTION OF DIALYSIS CATHETER: Ultrasound guided;  Surgeon: VSerafina Mitchell MD;  [place]  Service: Vascular;  Laterality: Right;  ? LIGATION  OF ARTERIOVENOUS  FISTULA Right 01/26/2014  ? Procedure: LIGATION OF ARTERIOVENOUS  FISTULA;  Surgeon: Mal Misty, MD;  Location: [place];  Service: Vascular;  Laterality: Right;  ? LOOP RECORDER IMPLANT N/A 07/21/2013  ? Procedure: LOOP RECORDER IMPLANT;  Surgeon: Evans Lance, MD;  Location: [facility] C[facility];  Service: Cardiovascular;  Laterality: N/A;  ? POLYPECTOMY  07/08/2018  ? Procedure: POLYPECTOMY;  Surgeon: Rogene Houston, MD;  [facility];  Service: Endoscopy;;   Descending colon polyps x 2   ? TEE WITHOUT CARDIOVERSION N/A 07/21/2013  ? Procedure: TRANSESOPHAGEAL ECHOCARDIOGRAM (TEE);  Surgeon: Dorothy Spark, MD;  Location: [place];  Service: Cardiovascular;  Laterality: N/A;  ? ? ?Prior to Admission medications   ?Medication Sig Start Date End Date Taking? Authorizing Provider  ?albuterol (VENTOLIN HFA) 108 (90 Base) MCG/ACT inhaler Inhale 2 puffs into the lungs every 6 (six) hours as needed for wheezing or shortness of breath. 11/21/20  Yes Gerlene Fee, NP  ?aspirin 81 MG chewable tablet Chew 2 tablets (162 mg total) by mouth daily. 07/09/18  Yes Rehman, Mechele Dawley, MD  ?BD INSULIN SYRINGE U/F 31G X 5/16" 0.3 ML MISC Inject 1 each into the skin 2 (two) times daily. as directed 11/21/20  Yes Gerlene Fee, NP  ?cyanocobalamin (,VITAMIN B-12,) 1000 MCG/ML injection Inject 1 mL into the muscle every 30 (thirty) days. 11/01/14  Yes [provider]  ?diclofenac Sodium (VOLTAREN) 1 % GEL Apply 4 g topically 4 (four) times daily as needed. Apply to bilateral knees 03/16/21  Yes Scot Jun, FNP  ?HUMULIN 70/30 (70-30) 100 UNIT/ML injection Inject 10 Units into the skin 2 (two) times daily. 08/23/21  Yes [provider]  ?labetalol (NORMODYNE) 200 MG tablet Take 200 mg by mouth 2 (two) times daily. 07/26/21  Yes [provider]  ?multivitamin (RENA-VIT) TABS tablet Take 1 tablet by mouth at bedtime. 08/21/14  Yes Kirsteins, Luanna Salk, MD  ?sevelamer carbonate (RENVELA) 800 MG tablet Take 1 tablet (800 mg total) by mouth 2 (two) times daily before lunch and supper. 11/21/20  Yes Gerlene Fee, NP  ?Insulin Lispro Prot & Lispro (HUMALOG 75/25 MIX) (75-25) 100 UNIT/ML Kwikpen Inject 10 Units into the skin 2 (two) times daily with a meal. ?Patient not taking: Reported on 09/10/2021 08/22/21   Cassandria Anger, MD  ?lidocaine-prilocaine (EMLA) cream Apply 1 application topically every Monday, Wednesday, and Friday. ?Patient not taking: Reported on  09/10/2021 11/21/20   Gerlene Fee, NP  ?NON FORMULARY Diet: NAS, Cons CHO    [provider]  ?predniSONE (DELTASONE) 50 MG tablet Take one tablet by mouth once daily for 5 days. ?Patient not taking: Reported on 09/10/2021 07/02/21   Suzan Slick, NP  ?traMADol (ULTRAM) 50 MG tablet Take 1 tablet (50 mg total) by mouth every 6 (six) hours as needed. ?Patient not taking: Reported on 09/10/2021 08/07/21   Suzan Slick, NP  ? ? ?Current Facility-Administered Medications  ?Medication Dose Route Frequency Provider Last Rate Last Admin  ? 0.9 %  sodium chloride infusion   Intravenous Continuous Harvel Quale, MD      ? ?Current Outpatient Medications  ?Medication Sig Dispense Refill  ? albuterol (VENTOLIN HFA) 108 (90 Base) MCG/ACT inhaler Inhale 2 puffs into the lungs every 6 (six) hours as needed for wheezing or shortness of breath. 1 each 0  ? aspirin 81 MG chewable tablet Chew 2 tablets (162 mg total) by  mouth daily.    ? BD INSULIN SYRINGE U/F 31G X 5/16" 0.3 ML MISC Inject 1 each into the skin 2 (two) times daily. as directed 100 each 0  ? cyanocobalamin (,VITAMIN B-12,) 1000 MCG/ML injection Inject 1 mL into the muscle every 30 (thirty) days.  11  ? diclofenac Sodium (VOLTAREN) 1 % GEL Apply 4 g topically 4 (four) times daily as needed. Apply to bilateral knees 100 g 0  ? HUMULIN 70/30 (70-30) 100 UNIT/ML injection Inject 10 Units into the skin 2 (two) times daily.    ? labetalol (NORMODYNE) 200 MG tablet Take 200 mg by mouth 2 (two) times daily.    ? multivitamin (RENA-VIT) TABS tablet Take 1 tablet by mouth at bedtime. 30 tablet 0  ? sevelamer carbonate (RENVELA) 800 MG tablet Take 1 tablet (800 mg total) by mouth 2 (two) times daily before lunch and supper. 60 tablet 0  ? Insulin Lispro Prot & Lispro (HUMALOG 75/25 MIX) (75-25) 100 UNIT/ML Kwikpen Inject 10 Units into the skin 2 (two) times daily with a meal. (Patient not taking: Reported on 09/10/2021) 15 mL 1  ? lidocaine-prilocaine  (EMLA) cream Apply 1 application topically every Monday, Wednesday, and Friday. (Patient not taking: Reported on 09/10/2021) 30 g 0  ? NON FORMULARY Diet: NAS, Cons CHO    ? predniSONE (DELTASONE) 50 MG tablet Take one t

## 2021-09-10 NOTE — Anesthesia Postprocedure Evaluation (Signed)
Anesthesia Post Note ? ?Patient: [name] ? ?Procedure(s) Performed: ESOPHAGOGASTRODUODENOSCOPY (EGD) WITH PROPOFOL ?BIOPSY ? ?Patient location during evaluation: PACU ?Anesthesia Type: General ?Level of consciousness: awake and alert ?Pain management: pain level controlled ?Vital Signs Assessment: post-procedure vital signs reviewed and stable ?Respiratory status: spontaneous breathing, nonlabored ventilation, respiratory function stable and patient connected to nasal cannula oxygen ?Cardiovascular status: blood pressure returned to baseline and stable ?Postop Assessment: no apparent nausea or vomiting ?Anesthetic complications: no ? ? ?No notable events documented. ? ? ?Last Vitals:  ?Vitals:  ? 09/10/21 1521 09/10/21 1534  ?BP: (!) 199/75 (!) 154/113  ?Pulse: 77   ?Resp: 15   ?Temp: 36.8 ?C   ?SpO2: 100%   ?  ?Last Pain:  ?Vitals:  ? 09/10/21 1655  ?TempSrc:   ?PainSc: 0-No pain  ? ? ?  ?  ?  ?  ?  ?  ? ?Louann Sjogren ? ? ? ? ?

## 2021-09-10 NOTE — ED Provider Notes (Signed)
?[facility] ?Provider Note ? ? ?CSN: [id number] ?Arrival date & time: 09/10/21  1113 ? ?  ? ?History ? ?Chief Complaint  ?Patient presents with  ? Sore Throat  ? ? ?[name] is a 81 y.o. female. ? ?HPI ? ?Patient with medical history including hypertension, end-stage renal disease on dialysis Monday Wednesday Friday diabetes presents  with complaints of difficulty swallowing.  Patient states that this started this morning, states that she was eating grits bacon and eggs after she ate this,  felt as if it got stuck in the bottom of her throat.  She states that since then she has been unable to swallow.  Every time she tries to eat or drink something it feels as if it comes back up.  She states she has some pain in her throat but denies any difficulty breathing chest pain shortness of breath.  She states that she has been having difficulty swallowing for the last couple years, she states that she has had to have it stretched out many years ago.  She has no other complaints, she went to dialysis yesterday and got her full treatment.  ? ?Patient's daughter is at bedside able to validate the story.  I have reviewed patient's chart unable to locate last EGD that was performed. ? ?Home Medications ?Prior to Admission medications   ?Medication Sig Start Date End Date Taking? Authorizing Provider  ?albuterol (VENTOLIN HFA) 108 (90 Base) MCG/ACT inhaler Inhale 2 puffs into the lungs every 6 (six) hours as needed for wheezing or shortness of breath. 11/21/20  Yes Gerlene Fee, NP  ?aspirin 81 MG chewable tablet Chew 2 tablets (162 mg total) by mouth daily. 07/09/18  Yes Rehman, Mechele Dawley, MD  ?BD INSULIN SYRINGE U/F 31G X 5/16" 0.3 ML MISC Inject 1 each into the skin 2 (two) times daily. as directed 11/21/20  Yes Gerlene Fee, NP  ?cyanocobalamin (,VITAMIN B-12,) 1000 MCG/ML injection Inject 1 mL into the muscle every 30 (thirty) days. 11/01/14  Yes [provider]  ?diclofenac Sodium  (VOLTAREN) 1 % GEL Apply 4 g topically 4 (four) times daily as needed. Apply to bilateral knees 03/16/21  Yes Scot Jun, FNP  ?HUMULIN 70/30 (70-30) 100 UNIT/ML injection Inject 10 Units into the skin 2 (two) times daily. 08/23/21  Yes [provider]  ?labetalol (NORMODYNE) 200 MG tablet Take 200 mg by mouth 2 (two) times daily. 07/26/21  Yes [provider]  ?multivitamin (RENA-VIT) TABS tablet Take 1 tablet by mouth at bedtime. 08/21/14  Yes Kirsteins, Luanna Salk, MD  ?sevelamer carbonate (RENVELA) 800 MG tablet Take 1 tablet (800 mg total) by mouth 2 (two) times daily before lunch and supper. 11/21/20  Yes Gerlene Fee, NP  ?Insulin Lispro Prot & Lispro (HUMALOG 75/25 MIX) (75-25) 100 UNIT/ML Kwikpen Inject 10 Units into the skin 2 (two) times daily with a meal. ?Patient not taking: Reported on 09/10/2021 08/22/21   Cassandria Anger, MD  ?lidocaine-prilocaine (EMLA) cream Apply 1 application topically every Monday, Wednesday, and Friday. ?Patient not taking: Reported on 09/10/2021 11/21/20   Gerlene Fee, NP  ?NON FORMULARY Diet: NAS, Cons CHO    [provider]  ?predniSONE (DELTASONE) 50 MG tablet Take one tablet by mouth once daily for 5 days. ?Patient not taking: Reported on 09/10/2021 07/02/21   Suzan Slick, NP  ?traMADol (ULTRAM) 50 MG tablet Take 1 tablet (50 mg total) by mouth every 6 (six) hours as needed. ?  Patient not taking: Reported on 09/10/2021 08/07/21   Suzan Slick, NP  ?   ? ?Allergies    ?Ambien [zolpidem] and Reglan [metoclopramide]   ? ?Review of Systems   ?Review of Systems  ?Constitutional:  Negative for chills and fever.  ?HENT:  Positive for trouble swallowing.   ?Respiratory:  Negative for shortness of breath.   ?Cardiovascular:  Negative for chest pain.  ?Gastrointestinal:  Negative for abdominal pain.  ?Neurological:  Negative for headaches.  ? ?Physical Exam ?Updated Vital Signs ?BP (!) 164/80   Pulse 73   Temp 98.1 ?F (36.7 ?C) (Oral)   Resp (!)  22   Ht '5\' 7"'$  (1.702 m)   Wt 82.6 kg   SpO2 98%   BMI 28.51 kg/m?  ?Physical Exam ?Vitals and nursing note reviewed.  ?Constitutional:   ?   General: She is not in acute distress. ?   Appearance: She is not ill-appearing.  ?HENT:  ?   Head: Normocephalic and atraumatic.  ?   Nose: No congestion.  ?   Mouth/Throat:  ?   Mouth: Mucous membranes are moist.  ?   Pharynx: Oropharynx is clear. No oropharyngeal exudate or posterior oropharyngeal erythema.  ?   Comments: No trismus no torticollis, controlling oral secretions, tongue uvula both midline tonsils equal symmetric bilaterally.  No tongue elevation. ?Eyes:  ?   Conjunctiva/sclera: Conjunctivae normal.  ?Cardiovascular:  ?   Rate and Rhythm: Normal rate and regular rhythm.  ?   Pulses: Normal pulses.  ?   Heart sounds: No murmur heard. ?  No friction rub. No gallop.  ?Pulmonary:  ?   Effort: No respiratory distress.  ?   Breath sounds: No wheezing, rhonchi or rales.  ?Abdominal:  ?   Palpations: Abdomen is soft.  ?   Tenderness: There is no abdominal tenderness. There is no right CVA tenderness or left CVA tenderness.  ?Skin: ?   General: Skin is warm and dry.  ?Neurological:  ?   Mental Status: She is alert.  ?Psychiatric:     ?   Mood and Affect: Mood normal.  ? ? ?ED Results / Procedures / Treatments   ?Labs ?(all labs ordered are listed, but only abnormal results are displayed) ?Labs Reviewed  ?BASIC METABOLIC PANEL - Abnormal; Notable for the following components:  ?    Result Value  ? Glucose, Bld 163 (*)   ? BUN 42 (*)   ? Creatinine, Ser 6.29 (*)   ? Calcium 8.0 (*)   ? GFR, Estimated 6 (*)   ? All other components within normal limits  ?CBC WITH DIFFERENTIAL/PLATELET - Abnormal; Notable for the following components:  ? RBC 3.30 (*)   ? Hemoglobin 9.9 (*)   ? HCT 31.7 (*)   ? RDW 16.2 (*)   ? Platelets 91 (*)   ? All other components within normal limits  ?RESP PANEL BY RT-PCR (FLU A&B, COVID) ARPGX2  ? ? ?EKG ?None ? ?Radiology ?No results  found. ? ?Procedures ?Procedures  ? ? ?Medications Ordered in ED ?Medications  ?glucagon (human recombinant) (GLUCAGEN) injection 1 mg (1 mg Intravenous Given 09/10/21 1344)  ?nitroGLYCERIN (NITROSTAT) SL tablet 0.4 mg (0.4 mg Sublingual Given 09/10/21 1344)  ? ? ?ED Course/ Medical Decision Making/ A&P ?  ?                        ?Medical Decision Making ?Amount and/or Complexity of Data Reviewed ?Labs:  ordered. ?Radiology: ordered. ? ?Risk ?Prescription drug management. ?Decision regarding hospitalization. ? ? ?This patient presents to the ED for concern of difficulty swallowing, this involves an extensive number of treatment options, and is a complaint that carries with it a high risk of complications and morbidity.  The differential diagnosis includes food bolus, upper airway obstruction, anaphylaxis ? ? ? ?Additional history obtained: ? ?Additional history obtained from previous procedure notes, daughter at bedside ?External records from outside source obtained and reviewed including please see HPI ? ? ?Co morbidities that complicate the patient evaluation ? ?Diabetes, end-stage renal disease ? ?Social Determinants of Health: ? ?Geriatrics ? ? ? ?Lab Tests: ? ?I Ordered, and personally interpreted labs.  The pertinent results include: BMP shows glucose of 162 BUN 42 creatinine 6.29 calcium 8 GFR 6 ? ? ?Imaging Studies ordered: ? ?I ordered imaging studies including chest x-ray ?I independently visualized and interpreted imaging which showed mild cardiomegaly with chronic vascular congestion. ?I agree with the radiologist interpretation ? ? ?Cardiac Monitoring: ? ?The patient was maintained on a cardiac monitor.  I personally viewed and interpreted the cardiac monitored which showed an underlying rhythm of: EKG ? ? ?Medicines ordered and prescription drug management: ? ?I ordered medication including glucagon, nitro for the food bolus ?I have reviewed the patients home medicines and have made adjustments as  needed ? ?Critical Interventions: ? ?N/A ? ? ?Reevaluation: ? ?Presents with difficulty swallowing, since patient she is unable to tolerate p.o., she was drinking water and immediately regurgitated back up.  I am concerned for

## 2021-09-10 NOTE — Progress Notes (Addendum)
? ?[name] - 81 y.o. female MRN [id number]  Date of birth: [birth year] ? ?Office Visit Note: ?Visit Date: 08/29/2021 ?PCP: [name], MD ?Referred by: [name], MD ? ?Subjective: ?Chief Complaint  ?Patient presents with  ? Lower Back - Pain  ? Right Leg - Pain  ? Right Foot - Pain  ? ?HPI:  [name] is a 81 y.o. female who comes in today at the request of Dondra Prader, Mabel for planned Right L4 Lumbar Transforaminal epidural steroid injection with fluoroscopic guidance.  The patient has failed conservative care including home exercise, medications, time and activity modification.  This injection will be diagnostic and hopefully therapeutic.  Please see requesting physician notes for further details and justification. MRI reviewed with images and spine model.  MRI reviewed in the note below. ? ? ?ROS Otherwise per HPI. ? ?Assessment & Plan: ?Visit Diagnoses:  ?  ICD-10-CM   ?1. Radiculopathy, lumbar region  M54.16 XR C-ARM NO REPORT  ?  Epidural Steroid injection  ?  methylPREDNISolone acetate (DEPO-MEDROL) injection 80 mg  ?  ?  ?Plan: No additional findings.  ? ?Meds & Orders:  ?Meds ordered this encounter  ?Medications  ? methylPREDNISolone acetate (DEPO-MEDROL) injection 80 mg  ?  ?Orders Placed This Encounter  ?Procedures  ? XR C-ARM NO REPORT  ? Epidural Steroid injection  ?  ?Follow-up: Return if symptoms worsen or fail to improve.  ? ?Procedures: ?**Report mislabled, injection was done at the L4 level ? ?Lumbosacral Transforaminal Epidural Steroid Injection - Sub-Pedicular Approach with Fluoroscopic Guidance ? ?Patient: [name]      ?Date of Birth: [birth year] ?MRN: [id number] ?PCP: [name], MD      ?Visit Date: 08/29/2021 ?  ?Universal Protocol:    ?Date/Time: 08/29/2021 ? ?Consent Given By: the patient ? ?Position: PRONE ? ?Additional Comments: ?Vital signs were monitored before and after the procedure. ?Patient was prepped and draped in the usual sterile fashion. ?The  correct patient, procedure, and site was verified. ? ? ?Injection Procedure Details:  ? ?Procedure diagnoses: Radiculopathy, lumbar region [M54.16]   ? ?Meds Administered:  ?Meds ordered this encounter  ?Medications  ? methylPREDNISolone acetate (DEPO-MEDROL) injection 80 mg  ? ? ?Laterality: Right ? ?Location/Site: L5 ? ?Needle:5.0 in., 22 ga.  Short bevel or Quincke spinal needle ? ?Needle Placement: Transforaminal ? ?Findings: ?  ? -Comments: Excellent flow of contrast along the nerve, nerve root and into the epidural space. ? ?Procedure Details: ?After squaring off the end-plates to get a true AP view, the C-arm was positioned so that an oblique view of the foramen as noted above was visualized. The target area is just inferior to the "nose of the scotty dog" or sub pedicular. The soft tissues overlying this structure were infiltrated with 2-3 ml. of 1% Lidocaine without Epinephrine. ? ?The spinal needle was inserted toward the target using a "trajectory" view along the fluoroscope beam.  Under AP and lateral visualization, the needle was advanced so it did not puncture dura and was located close the 6 O'Clock position of the pedical in AP tracterory. Biplanar projections were used to confirm position. Aspiration was confirmed to be negative for CSF and/or blood. A 1-2 ml. volume of Isovue-250 was injected and flow of contrast was noted at each level. Radiographs were obtained for documentation purposes.  ? ?After attaining the desired flow of contrast documented above, a 0.5 to 1.0 ml test dose of 0.25% Marcaine was injected into each respective transforaminal  space.  The patient was observed for 90 seconds post injection.  After no sensory deficits were reported, and normal lower extremity motor function was noted,   the above injectate was administered so that equal amounts of the injectate were placed at each foramen (level) into the transforaminal epidural space. ? ? ?Additional Comments:  ?The patient  tolerated the procedure well ?Dressing: 2 x 2 sterile gauze and Band-Aid ?  ? ?Post-procedure details: ?Patient was observed during the procedure. ?Post-procedure instructions were reviewed. ? ?Patient left the clinic in stable condition. ?  ? ?Clinical History: ?MRI LUMBAR SPINE WITHOUT CONTRAST ?  ?TECHNIQUE: ?Multiplanar, multisequence MR imaging of the lumbar spine was ?performed. No intravenous contrast was administered. ?  ?COMPARISON:  CT 03/23/2020 ?  ?FINDINGS: ?Segmentation:  Standard. ?  ?Alignment: Grade 1 anterolisthesis of L4 on L5. Slight lumbar ?dextrocurvature. ?  ?Vertebrae: No fracture, evidence of discitis, or bone lesion. ?Superior endplate Schmorl's node of L1. ?  ?Conus medullaris and cauda equina: Conus extends to the T12-L1 ?level. Conus appears normal. Slight bunching of the cauda equina ?nerve roots above the L4-5 level of stenosis. ?  ?Paraspinal and other soft tissues: Colonic diverticulosis. ?Cortically based T2 hyperintense lesionswithin the bilateral ?kidneys, incompletely characterized, but most likely represent ?cysts. ?  ?Disc levels: ?  ?T12-L1: Minimal disc bulge. Unremarkable facet joints. No foraminal ?or canal stenosis. ?  ?L1-L2: Unremarkable disc. Minimal facet arthropathy. No foraminal or ?canal stenosis. ?  ?L2-L3: Mild annular disc bulge. Mild bilateral facet arthropathy. No ?canal stenosis. Borderline-mild right foraminal stenosis. ?  ?L3-L4: No disc protrusion. Mild bilateral facet arthropathy. No ?canal stenosis. Borderline-mild bilateral foraminal stenosis. ?  ?L4-L5: Anterolisthesis with disc uncovering and diffuse disc bulge. ?Severe bilateral facet arthropathy and ligamentum flavum buckling. ?Findings result in severe canal stenosis with severe left and ?moderate right foraminal stenosis. ?  ?L5-S1: Diffuse disc bulge, eccentric to the right with endplate ?osteophytic ridging. Mild bilateral facet arthropathy. Moderate ?right and mild left foraminal stenosis. No  canal stenosis. ?  ?IMPRESSION: ?1. Multilevel lumbar spondylosis as described above, most pronounced ?at L4-5 where there is severe canal stenosis with severe left and ?moderate right foraminal stenosis. ?2. Moderate right and mild left foraminal stenosis at L5-S1. ?  ?  ?Electronically Signed ?  By: Davina Poke D.O. ?  On: 08/24/2021 20:31  ? ? ? ?Objective:  VS:  HT:    WT:   BMI:     BP:110/61  HR:77bpm  TEMP: ( )  RESP:  ?Physical Exam ?Vitals and nursing note reviewed.  ?Constitutional:   ?   General: She is not in acute distress. ?   Appearance: Normal appearance. She is not ill-appearing.  ?HENT:  ?   Head: Normocephalic and atraumatic.  ?   Right Ear: External ear normal.  ?   Left Ear: External ear normal.  ?Eyes:  ?   Extraocular Movements: Extraocular movements intact.  ?Cardiovascular:  ?   Rate and Rhythm: Normal rate.  ?   Pulses: Normal pulses.  ?Pulmonary:  ?   Effort: Pulmonary effort is normal. No respiratory distress.  ?Abdominal:  ?   General: There is no distension.  ?   Palpations: Abdomen is soft.  ?Musculoskeletal:     ?   General: Tenderness present.  ?   Cervical back: Neck supple.  ?   Right lower leg: No edema.  ?   Left lower leg: No edema.  ?   Comments: Patient has good distal  strength with no pain over the greater trochanters.  No clonus or focal weakness.  ?Skin: ?   Findings: No erythema, lesion or rash.  ?Neurological:  ?   General: No focal deficit present.  ?   Mental Status: She is alert and oriented to person, place, and time.  ?   Sensory: No sensory deficit.  ?   Motor: No weakness or abnormal muscle tone.  ?   Coordination: Coordination normal.  ?Psychiatric:     ?   Mood and Affect: Mood normal.     ?   Behavior: Behavior normal.  ?  ? ?Imaging: ?No results found. ?

## 2021-09-10 NOTE — ED Notes (Signed)
This nurse and another RN attempted IV access on L arm several times. L arm restricted. Will attempt to obtain access via IV ultrasound--PA-C made aware ?

## 2021-09-10 NOTE — ED Triage Notes (Signed)
Pt arrived via [facility] c/o painful swallowing  after breakfast this morning. A&O x 4 ?

## 2021-09-10 NOTE — Anesthesia Preprocedure Evaluation (Signed)
Anesthesia Evaluation  ?Patient identified by MRN, date of birth, ID band ?Patient awake ? ? ? ?Reviewed: ?Allergy & Precautions, H&P , NPO status , Patient's Chart, lab work & pertinent test results, reviewed documented beta blocker date and time  ? ?Airway ?Mallampati: II ? ?TM Distance: >3 FB ?Neck ROM: full ? ? ? Dental ?no notable dental hx. ? ?  ?Pulmonary ?shortness of breath,  ?  ?Pulmonary exam normal ?breath sounds clear to auscultation ? ? ? ? ? ? Cardiovascular ?Exercise Tolerance: Good ?hypertension, + Peripheral Vascular Disease  ? ?Rhythm:regular Rate:Normal ? ? ?  ?Neuro/Psych ? Neuromuscular disease CVA, Residual Symptoms negative psych ROS  ? GI/Hepatic ?Neg liver ROS, GERD  Medicated,  ?Endo/Other  ?negative endocrine ROSdiabetes, Type 2 ? Renal/GU ?ESRF and DialysisRenal disease  ?negative genitourinary ?  ?Musculoskeletal ? ? Abdominal ?  ?Peds ? Hematology ? ?(+) Blood dyscrasia, anemia ,   ?Anesthesia Other Findings ? ? Reproductive/Obstetrics ?negative OB ROS ? ?  ? ? ? ? ? ? ? ? ? ? ? ? ? ?  ?  ? ? ? ? ? ? ? ? ?Anesthesia Physical ?Anesthesia Plan ? ?ASA: 2 ? ?Anesthesia Plan: General, General ETT and Rapid Sequence  ? ?Post-op Pain Management:   ? ?Induction:  ? ?PONV Risk Score and Plan: Ondansetron ? ?Airway Management Planned:  ? ?Additional Equipment:  ? ?Intra-op Plan:  ? ?Post-operative Plan:  ? ?Informed Consent: I have reviewed the patients History and Physical, chart, labs and discussed the procedure including the risks, benefits and alternatives for the proposed anesthesia with the patient or authorized representative who has indicated his/her understanding and acceptance.  ? ? ? ?Dental Advisory Given ? ?Plan Discussed with: CRNA ? ?Anesthesia Plan Comments:   ? ? ? ? ? ? ?Anesthesia Quick Evaluation ? ?

## 2021-09-10 NOTE — Brief Op Note (Addendum)
09/10/2021 ? ?5:35 PM ? ?PATIENT:  [name]  81 y.o. female ? ?PRE-OPERATIVE DIAGNOSIS:  food impaction ? ?POST-OPERATIVE DIAGNOSIS:  gastric ulcer;hiatal hernia;ring at GE junction; ? ?PROCEDURE:  Procedure(s) with comments: ?ESOPHAGOGASTRODUODENOSCOPY (EGD) WITH PROPOFOL (N/A) ?BIOPSY - gastric ? ?SURGEON:  Surgeon(s) and Role: ?   Harvel Quale, MD - Primary ? ?Patient underwent EGD under propofol sedation.  Tolerated the procedure adequately.  A non-obstructing Schatzki ring was found at the gastroesophageal junction. This was initially disrupted with a forceps.  A TTS dilator was passed through the scope.  Dilation with a 15-16.5-18 mm balloon dilator was performed to 18 mm.  mucosal disruption was seen upon reinspection. No food was present in the esophagus. A 2 cm hiatal hernia was present. Diffuse atrophic mucosa was found in the entire examined stomach. Segmental moderate inflammation characterized by erythema was found in the gastric antrum.  Biopsies were taken with a cold forceps for Helicobacter pylori testing. Few non-bleeding superficial gastric ulcers with a clean ulcer base (Forrest Class III) were found in the gastric antrum.  The largest lesion was 5 mm in largest dimension. A single 5 mm mucosal nodule with a localized distribution was found in the duodenal bulb.  Biopsies were taken with a cold forceps for histology. The exam of the duodenum was otherwise normal.  ? ?RECOMMENDATIONS ?- Discharge patient to home (ambulatory).  ?- Resume previous diet.  ?- Await pathology results.  ?- Use Prilosec (omeprazole) 40 mg PO daily indefinitely.  ?- Return to clinic in 3-4 weeks. ? ?Maylon Peppers, MD ?Gastroenterology and Hepatology ?[facility] ? ?

## 2021-09-11 DIAGNOSIS — Z992 Dependence on renal dialysis: Secondary | ICD-10-CM | POA: Diagnosis not present

## 2021-09-11 DIAGNOSIS — N186 End stage renal disease: Secondary | ICD-10-CM | POA: Diagnosis not present

## 2021-09-11 LAB — GLUCOSE, CAPILLARY: Glucose-Capillary: 184 mg/dL — ABNORMAL HIGH (ref 70–99)

## 2021-09-12 ENCOUNTER — Encounter: Payer: Self-pay | Admitting: Physical Medicine and Rehabilitation

## 2021-09-12 ENCOUNTER — Ambulatory Visit (INDEPENDENT_AMBULATORY_CARE_PROVIDER_SITE_OTHER): Payer: Medicare Other | Admitting: Physical Medicine and Rehabilitation

## 2021-09-12 VITALS — BP 118/61 | HR 82

## 2021-09-12 DIAGNOSIS — M4726 Other spondylosis with radiculopathy, lumbar region: Secondary | ICD-10-CM

## 2021-09-12 DIAGNOSIS — M5416 Radiculopathy, lumbar region: Secondary | ICD-10-CM | POA: Diagnosis not present

## 2021-09-12 DIAGNOSIS — E538 Deficiency of other specified B group vitamins: Secondary | ICD-10-CM | POA: Diagnosis not present

## 2021-09-12 DIAGNOSIS — R269 Unspecified abnormalities of gait and mobility: Secondary | ICD-10-CM | POA: Diagnosis not present

## 2021-09-12 DIAGNOSIS — D510 Vitamin B12 deficiency anemia due to intrinsic factor deficiency: Secondary | ICD-10-CM | POA: Diagnosis not present

## 2021-09-12 DIAGNOSIS — M48062 Spinal stenosis, lumbar region with neurogenic claudication: Secondary | ICD-10-CM | POA: Diagnosis not present

## 2021-09-12 DIAGNOSIS — M47816 Spondylosis without myelopathy or radiculopathy, lumbar region: Secondary | ICD-10-CM | POA: Diagnosis not present

## 2021-09-12 MED ORDER — TRAMADOL HCL 50 MG PO TABS: 50.0000 mg | ORAL_TABLET | Freq: Three times a day (TID) | ORAL | 0 refills | Status: DC | PRN

## 2021-09-12 NOTE — Progress Notes (Signed)
? ?[name] - 81 y.o. female MRN [id number]  Date of birth: 1941/02/13 ? ?Office Visit Note: ?Visit Date: 09/12/2021 ?PCP: Barbara Beard, MD ?Referred by: Barbara Beard, MD ? ?Subjective: ?Chief Complaint  ?Patient presents with  ? Lower Back - Pain  ? Right Leg - Pain  ? Right Hip - Pain  ? Right Foot - Pain  ? ?HPI: [name] is a 81 y.o. female who comes in today for evaluation of chronic, worsening and severe right-sided lower back pain radiating down anterior leg to foot. Patients daughter accompanies her during our visit today. Patient reports pain is exacerbated by moving from sitting to standing position, movement and activity. Patient describes pain as constant sore, heavy and tingling sensation, currently rates as 8 out of 10. Patient reports some relief of pain with rest and use of medications. Patient does take Tramadol as needed for moderate/severe pain.  Patient's recent lumbar MRI exhibits multi-level facet arthropathy and severe canal stenosis with severe left and moderate right foraminal stenosis at L4-L5. Patient had right L4 transforaminal epidural steroid injection on 08/29/2021 and reports no relief of pain with this procedure. Patient states she continues to have severe pain that makes it difficult to walk and perform daily tasks. Patient is currently using walker to assist with ambulation and to prevent falls. Patient does not have history of lumbar surgery. Patient denies focal weakness. Patient denies recent trauma or falls.  ? ?Patients course is complicated by diabetes mellitus, end stage renal disease on hemodialysis, acute respiratory failure and CVA.  ? ?Review of Systems  ?Musculoskeletal:  Positive for back pain.  ?Neurological:  Negative for tingling, sensory change, focal weakness and weakness.  Otherwise per HPI. ? ?Assessment & Plan: ?Visit Diagnoses:  ?  ICD-10-CM   ?1. Radiculopathy, lumbar region  M54.16 Ambulatory referral to Physical Medicine Rehab  ?  ?2.  Other spondylosis with radiculopathy, lumbar region  M47.26 Ambulatory referral to Physical Medicine Rehab  ?  ?3. Spinal stenosis of lumbar region with neurogenic claudication  M48.062 Ambulatory referral to Physical Medicine Rehab  ?  ?4. Facet arthropathy, lumbar  M47.816   ?  ?5. Gait abnormality  R26.9   ?  ?   ?Plan: Findings:  ?Chronic, worsening and severe right sided lower back pain radiating down anterior leg to foot. Patient continues to have severe pain despite good conservative therapies such as rest and use of medications. I did review patients MRI with patient and daughter today using images and spine model. Patients clinical presentation and exam are consistent with neurogenic claudication as a result of spinal canal stenosis. Patient has severe spinal canal stenosis noted at L4-L5. We believe the next step is to perform right L4-L5 interlaminar epidural steroid injection under fluoroscopic guidance. Patient is not currently undergoing long term anticoagulant therapy. I did speak with patient about medication management and did place a prescription for short course of Tramadol.  Patient is agreeable with plan and has no questions at this time. No red flag symptoms noted upon exam today.   ? ?Meds & Orders:  ?Meds ordered this encounter  ?Medications  ? traMADol (ULTRAM) 50 MG tablet  ?  Sig: Take 1 tablet (50 mg total) by mouth every 8 (eight) hours as needed for moderate pain or severe pain.  ?  Dispense:  21 tablet  ?  Refill:  0  ?  Order Specific Question:   Supervising Provider  ?  AnswerMagnus Sinning [[id number]]  ?  ?  Orders Placed This Encounter  ?Procedures  ? Ambulatory referral to Physical Medicine Rehab  ?  ?Follow-up: Return for Right L4-L5 interlaminar epidural steroid injection.  ? ?Procedures: ?No procedures performed  ?   ? ?Clinical History: ?MRI LUMBAR SPINE WITHOUT CONTRAST ?  ?TECHNIQUE: ?Multiplanar, multisequence MR imaging of the lumbar spine was ?performed. No intravenous  contrast was administered. ?  ?COMPARISON:  CT 03/23/2020 ?  ?FINDINGS: ?Segmentation:  Standard. ?  ?Alignment: Grade 1 anterolisthesis of L4 on L5. Slight lumbar ?dextrocurvature. ?  ?Vertebrae: No fracture, evidence of discitis, or bone lesion. ?Superior endplate Schmorl's node of L1. ?  ?Conus medullaris and cauda equina: Conus extends to the T12-L1 ?level. Conus appears normal. Slight bunching of the cauda equina ?nerve roots above the L4-5 level of stenosis. ?  ?Paraspinal and other soft tissues: Colonic diverticulosis. ?Cortically based T2 hyperintense lesionswithin the bilateral ?kidneys, incompletely characterized, but most likely represent ?cysts. ?  ?Disc levels: ?  ?T12-L1: Minimal disc bulge. Unremarkable facet joints. No foraminal ?or canal stenosis. ?  ?L1-L2: Unremarkable disc. Minimal facet arthropathy. No foraminal or ?canal stenosis. ?  ?L2-L3: Mild annular disc bulge. Mild bilateral facet arthropathy. No ?canal stenosis. Borderline-mild right foraminal stenosis. ?  ?L3-L4: No disc protrusion. Mild bilateral facet arthropathy. No ?canal stenosis. Borderline-mild bilateral foraminal stenosis. ?  ?L4-L5: Anterolisthesis with disc uncovering and diffuse disc bulge. ?Severe bilateral facet arthropathy and ligamentum flavum buckling. ?Findings result in severe canal stenosis with severe left and ?moderate right foraminal stenosis. ?  ?L5-S1: Diffuse disc bulge, eccentric to the right with endplate ?osteophytic ridging. Mild bilateral facet arthropathy. Moderate ?right and mild left foraminal stenosis. No canal stenosis. ?  ?IMPRESSION: ?1. Multilevel lumbar spondylosis as described above, most pronounced ?at L4-5 where there is severe canal stenosis with severe left and ?moderate right foraminal stenosis. ?2. Moderate right and mild left foraminal stenosis at L5-S1. ?  ?  ?Electronically Signed ?  By: Davina Poke D.O. ?  On: 08/24/2021 20:31  ? ?She reports that she has never smoked. She has never  used smokeless tobacco.  ?Recent Labs  ?  10/31/20 ?1330 03/25/21 ?0000 07/25/21 ?0951  ?HGBA1C 9.4* 8.5 8.7*  ? ? ?Objective:  VS:  HT:    WT:   BMI:     BP:118/61  HR:82bpm  TEMP: ( )  RESP:  ?Physical Exam ?Vitals and nursing note reviewed.  ?HENT:  ?   Head: Normocephalic and atraumatic.  ?   Right Ear: External ear normal.  ?   Left Ear: External ear normal.  ?   Nose: Nose normal.  ?   Mouth/Throat:  ?   Mouth: Mucous membranes are moist.  ?Eyes:  ?   Extraocular Movements: Extraocular movements intact.  ?Cardiovascular:  ?   Rate and Rhythm: Normal rate.  ?   Pulses: Normal pulses.  ?Pulmonary:  ?   Effort: Pulmonary effort is normal.  ?Abdominal:  ?   General: Abdomen is flat. There is no distension.  ?Musculoskeletal:     ?   General: Tenderness present.  ?   Cervical back: Normal range of motion.  ?   Comments: Pt rises from seated position to standing without difficulty. Good lumbar range of motion. Strong distal strength without clonus, no pain upon palpation of greater trochanters. Sensation intact bilaterally. Ambulates with walker, gait slow and unsteady.  ?Skin: ?   General: Skin is warm and dry.  ?   Capillary Refill: Capillary refill takes less than 2  seconds.  ?Neurological:  ?   Mental Status: She is alert and oriented to person, place, and time.  ?   Gait: Gait abnormal.  ?Psychiatric:     ?   Mood and Affect: Mood normal.     ?   Behavior: Behavior normal.  ?  ?Ortho Exam ? ?Imaging: ?No results found. ? ?Past Medical/Family/Surgical/Social History: ?Medications & Allergies reviewed per EMR, new medications updated. ?Patient Active Problem List  ? Diagnosis Date Noted  ? Hypertensive heart and CKD, ESRD on dialysis ([place]) 11/09/2020  ? 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 diabetes mellitus ([place]) 11/09/2020  ? Dependence on renal dialysis ([place]) 11/09/2020  ?  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 disease ([place]) 11/09/2020  ? Hyperlipidemia associated with type 2 diabetes mellitus ([place]) 11/09/2020  ? 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 Per

## 2021-09-12 NOTE — Progress Notes (Signed)
Pt state lower back pain that travels to her right hip, leg and foot. Pt state walking and trying to standing makes the pain worse. Pt stte the pain wakes her up at night. Pt state she takes pain meds to help ease her pain. Pt has hx of inj on 09/08/21 pt state it didn't help. Pt state is request refill on Rx. ? ?Numeric Pain Rating Scale and Functional Assessment ?Average Pain 9 ?Pain Right Now 8 ?My pain is constant, dull, tingling, and aching ?Pain is worse with: walking, standing, and some activites ?Pain improves with: medication and injections ? ? ?In the last MONTH (on 0-10 scale) has pain interfered with the following? ? ?1. General activity like being  able to carry out your everyday physical activities such as walking, climbing stairs, carrying groceries, or moving a chair?  ?Rating(4) ? ?2. Relation with others like being able to carry out your usual social activities and roles such as  activities at home, at work and in your community. ?Rating(5) ? ?3. Enjoyment of life such that you have  been bothered by emotional problems such as feeling anxious, depressed or irritable?  ?Rating(6) ? ?

## 2021-09-13 DIAGNOSIS — E1122 Type 2 diabetes mellitus with diabetic chronic kidney disease: Secondary | ICD-10-CM | POA: Diagnosis not present

## 2021-09-13 DIAGNOSIS — N186 End stage renal disease: Secondary | ICD-10-CM | POA: Diagnosis not present

## 2021-09-13 DIAGNOSIS — I120 Hypertensive chronic kidney disease with stage 5 chronic kidney disease or end stage renal disease: Secondary | ICD-10-CM | POA: Diagnosis not present

## 2021-09-13 DIAGNOSIS — Z992 Dependence on renal dialysis: Secondary | ICD-10-CM | POA: Diagnosis not present

## 2021-09-13 LAB — SURGICAL PATHOLOGY

## 2021-09-16 ENCOUNTER — Encounter (HOSPITAL_COMMUNITY): Payer: Self-pay | Admitting: Gastroenterology

## 2021-09-16 ENCOUNTER — Other Ambulatory Visit: Payer: Self-pay

## 2021-09-16 DIAGNOSIS — D3A8 Other benign neuroendocrine tumors: Secondary | ICD-10-CM

## 2021-09-16 DIAGNOSIS — E1122 Type 2 diabetes mellitus with diabetic chronic kidney disease: Secondary | ICD-10-CM | POA: Diagnosis not present

## 2021-09-16 DIAGNOSIS — N186 End stage renal disease: Secondary | ICD-10-CM | POA: Diagnosis not present

## 2021-09-16 DIAGNOSIS — Z992 Dependence on renal dialysis: Secondary | ICD-10-CM | POA: Diagnosis not present

## 2021-09-18 DIAGNOSIS — Z992 Dependence on renal dialysis: Secondary | ICD-10-CM | POA: Diagnosis not present

## 2021-09-18 DIAGNOSIS — N186 End stage renal disease: Secondary | ICD-10-CM | POA: Diagnosis not present

## 2021-09-20 DIAGNOSIS — Z992 Dependence on renal dialysis: Secondary | ICD-10-CM | POA: Diagnosis not present

## 2021-09-20 DIAGNOSIS — N186 End stage renal disease: Secondary | ICD-10-CM | POA: Diagnosis not present

## 2021-09-23 DIAGNOSIS — Z992 Dependence on renal dialysis: Secondary | ICD-10-CM | POA: Diagnosis not present

## 2021-09-23 DIAGNOSIS — E119 Type 2 diabetes mellitus without complications: Secondary | ICD-10-CM | POA: Diagnosis not present

## 2021-09-23 DIAGNOSIS — N186 End stage renal disease: Secondary | ICD-10-CM | POA: Diagnosis not present

## 2021-09-24 DIAGNOSIS — M545 Low back pain, unspecified: Secondary | ICD-10-CM | POA: Diagnosis not present

## 2021-09-24 DIAGNOSIS — N186 End stage renal disease: Secondary | ICD-10-CM | POA: Diagnosis not present

## 2021-09-24 DIAGNOSIS — E785 Hyperlipidemia, unspecified: Secondary | ICD-10-CM | POA: Diagnosis not present

## 2021-09-24 DIAGNOSIS — D510 Vitamin B12 deficiency anemia due to intrinsic factor deficiency: Secondary | ICD-10-CM | POA: Diagnosis not present

## 2021-09-24 DIAGNOSIS — E1122 Type 2 diabetes mellitus with diabetic chronic kidney disease: Secondary | ICD-10-CM | POA: Diagnosis not present

## 2021-09-24 DIAGNOSIS — I10 Essential (primary) hypertension: Secondary | ICD-10-CM | POA: Diagnosis not present

## 2021-09-25 DIAGNOSIS — N186 End stage renal disease: Secondary | ICD-10-CM | POA: Diagnosis not present

## 2021-09-25 DIAGNOSIS — Z992 Dependence on renal dialysis: Secondary | ICD-10-CM | POA: Diagnosis not present

## 2021-09-27 DIAGNOSIS — Z992 Dependence on renal dialysis: Secondary | ICD-10-CM | POA: Diagnosis not present

## 2021-09-27 DIAGNOSIS — N186 End stage renal disease: Secondary | ICD-10-CM | POA: Diagnosis not present

## 2021-09-30 ENCOUNTER — Encounter (HOSPITAL_COMMUNITY): Payer: Self-pay | Admitting: Gastroenterology

## 2021-09-30 DIAGNOSIS — N186 End stage renal disease: Secondary | ICD-10-CM | POA: Diagnosis not present

## 2021-09-30 DIAGNOSIS — Z992 Dependence on renal dialysis: Secondary | ICD-10-CM | POA: Diagnosis not present

## 2021-10-01 ENCOUNTER — Ambulatory Visit (INDEPENDENT_AMBULATORY_CARE_PROVIDER_SITE_OTHER): Payer: Medicare Other | Admitting: Family

## 2021-10-01 ENCOUNTER — Encounter: Payer: Self-pay | Admitting: Family

## 2021-10-01 DIAGNOSIS — B351 Tinea unguium: Secondary | ICD-10-CM

## 2021-10-01 NOTE — Progress Notes (Signed)
? ?Office Visit Note ?  ?Patient: [name]           ?Date of Birth: Nov 03, 1940           ?MRN: [id number] ?Visit Date: 10/01/2021 ?             ?Requested by: [name], MD ?[place] [place] ?[address] ?[place],  [place] [address] ?PCP: [name], MD ? ?Chief Complaint  ?Patient presents with  ? Left Foot - Follow-up  ? Right Foot - Follow-up  ? ? ? ? ? ?HPI: ?The patient is an 81 year old woman who presents today for routine foot evaluation. She also has onychomycotic nails.   ? ?She does have a history of lumbar radiculopathy.  She is currently following with Dr. Ernestina Patches for this.  Number ? ?Assessment & Plan: ?Visit Diagnoses:  ?1. Onychomycosis   ? ? ?Plan: Onychomycotic nails trimmed x9. follow-up in 3 months.   ? ?Follow-Up Instructions: Return in about 3 months (around 12/31/2021).  ? ?Ortho Exam ? ?Patient is alert, oriented, no adenopathy, well-dressed, normal affect, normal respiratory effort.  On examination bilateral feet there is no edema no erythema no skin breakdown no impending breakdown she does not have any callus.  She does have thickened and discolored onychomycotic nails x9.  The patient is unable to safely trim her own nails. nails were trimmed today x9 patient tolerated well. ? ?Imaging: ?No results found. ?No images are attached to the encounter. ? ?Labs: ?Lab Results  ?Component Value Date  ? HGBA1C 8.7 (A) 07/25/2021  ? HGBA1C 8.5 03/25/2021  ? HGBA1C 9.4 (H) 10/31/2020  ? ESRSEDRATE 42 (H) 01/06/2020  ? CRP 20.3 (H) 01/06/2020  ? REPTSTATUS 11/07/2020 FINAL 11/02/2020  ? CULT  11/02/2020  ?  NO GROWTH 5 DAYS ?Performed at [facility], [place] [address], [place], [place] [address] ?  ? ? ? ?Lab Results  ?Component Value Date  ? ALBUMIN 2.9 (L) 11/08/2020  ? ALBUMIN 3.1 (L) 11/07/2020  ? ALBUMIN 3.0 (L) 11/06/2020  ? ? ?Lab Results  ?Component Value Date  ? MG 1.9 06/26/2013  ? MG 1.7 01/25/2009  ? ?No results found for: VD25OH ? ?No results found for: PREALBUMIN ? ?  Latest Ref Rng &  Units 09/10/2021  ? 12:11 PM 11/09/2020  ? 11:38 AM 11/08/2020  ?  3:45 AM  ?CBC EXTENDED  ?WBC 4.0 - 10.5 K/uL 7.8   10.6   10.5    ?RBC 3.87 - 5.11 MIL/uL 3.30   3.71   3.73    ?Hemoglobin 12.0 - 15.0 g/dL 9.9   11.6   11.6    ?HCT 36.0 - 46.0 % 31.7   35.2   34.4    ?Platelets 150 - 400 K/uL 91   152   155    ?NEUT# 1.7 - 7.7 K/uL 5.6    6.1    ?Lymph# 0.7 - 4.0 K/uL 1.1    2.3    ? ? ? ?There is no height or weight on file to calculate BMI. ? ?Orders:  ?No orders of the defined types were placed in this encounter. ? ? ?No orders of the defined types were placed in this encounter. ? ? ? ? Procedures: ?No procedures performed ? ?Clinical Data: ?No additional findings. ? ?ROS: ? ?All other systems negative, except as noted in the HPI. ?Review of Systems ? ?Objective: ?Vital Signs: There were no vitals taken for this visit. ? ?Specialty Comments:  ?MRI  LUMBAR SPINE WITHOUT CONTRAST ?  ?TECHNIQUE: ?Multiplanar, multisequence MR imaging of the lumbar spine was ?performed. No intravenous contrast was administered. ?  ?COMPARISON:  CT 03/23/2020 ?  ?FINDINGS: ?Segmentation:  Standard. ?  ?Alignment: Grade 1 anterolisthesis of L4 on L5. Slight lumbar ?dextrocurvature. ?  ?Vertebrae: No fracture, evidence of discitis, or bone lesion. ?Superior endplate Schmorl's node of L1. ?  ?Conus medullaris and cauda equina: Conus extends to the T12-L1 ?level. Conus appears normal. Slight bunching of the cauda equina ?nerve roots above the L4-5 level of stenosis. ?  ?Paraspinal and other soft tissues: Colonic diverticulosis. ?Cortically based T2 hyperintense lesionswithin the bilateral ?kidneys, incompletely characterized, but most likely represent ?cysts. ?  ?Disc levels: ?  ?T12-L1: Minimal disc bulge. Unremarkable facet joints. No foraminal ?or canal stenosis. ?  ?L1-L2: Unremarkable disc. Minimal facet arthropathy. No foraminal or ?canal stenosis. ?  ?L2-L3: Mild annular disc bulge. Mild bilateral facet arthropathy. No ?canal  stenosis. Borderline-mild right foraminal stenosis. ?  ?L3-L4: No disc protrusion. Mild bilateral facet arthropathy. No ?canal stenosis. Borderline-mild bilateral foraminal stenosis. ?  ?L4-L5: Anterolisthesis with disc uncovering and diffuse disc bulge. ?Severe bilateral facet arthropathy and ligamentum flavum buckling. ?Findings result in severe canal stenosis with severe left and ?moderate right foraminal stenosis. ?  ?L5-S1: Diffuse disc bulge, eccentric to the right with endplate ?osteophytic ridging. Mild bilateral facet arthropathy. Moderate ?right and mild left foraminal stenosis. No canal stenosis. ?  ?IMPRESSION: ?1. Multilevel lumbar spondylosis as described above, most pronounced ?at L4-5 where there is severe canal stenosis with severe left and ?moderate right foraminal stenosis. ?2. Moderate right and mild left foraminal stenosis at L5-S1. ?  ?  ?Electronically Signed ?  By: Davina Poke D.O. ?  On: 08/24/2021 20:31 ? ?PMFS History: ?Patient Active Problem List  ? Diagnosis Date Noted  ? Hypertensive heart and CKD, ESRD on dialysis ([place]) 11/09/2020  ? 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 diabetes mellitus ([place]) 11/09/2020  ? Dependence on renal dialysis ([place]) 11/09/2020  ? 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 disease ([place]) 11/09/2020  ? Hyperlipidemia associated with type 2 diabetes mellitus ([place]) 11/09/2020  ? 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 Chronic constipation 11/09/2020  ? Vitamin B 12 deficiency 11/09/2020  ? Acute respiratory failure with hypoxia ([place]) 10/26/2020  ? Elevated troponin 10/26/2020  ? Volume overload 10/26/2020  ? Hypertensive urgency 10/26/2020  ? History of CVA (cerebrovascular accident) 01/05/2020  ? Thrombocytopenia ([place]) 01/05/2020  ?  History of colonic polyps 04/01/2018  ? Acute bronchitis 10/15/2017  ? 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 HCAP (healthcare-associated pneumonia) 05/29/2015  ? Hyperkalemia 05/29/2015  ? ESRD (end stage renal disease) on dialysis [facility])   ? Encounter for adequacy testing for hemodialysis ([place]) 01/17/2014  ? Mechanical complication of other vascular device, implant, and graft 01/17/2014  ? Disturbances of vision, late effect of stroke 11/03/2013  ? Right hemiparesis ([place]) 08/19/2013  ? CVA (cerebral infarction) 07/20/2013  ? Gastroparesis due to DM ([place]) 07/01/2013  ? HLD (hyperlipidemia) 05/20/2006  ? Hereditary and idiopathic peripheral neuropathy 05/20/2006  ? Hypertension associated with diabetes ([place]) 05/20/2006  ? ?Past Medical History:  ?Diagnosis Date  ? Anemia   ? Cataract   ?  Chronic kidney disease   ? CVA (cerebral infarction)   ? Diabetes mellitus with ESRD (end-stage renal disease) ([place])   ? Type 2  ? Dialysis patient [facility])   ? M, W, F  ? Fistula   ? R arm  ? GERD (gastroesophageal reflux disease)   ? Hypertension   ? Renal disorder   ? Shortness of breath   ? Stroke [facility] [place])   ? right side weakness  ?  ?Family History  ?Problem Relation Age of Onset  ? Cancer Sister   ? Cancer Brother   ? Anesthesia problems Neg Hx   ? Hypotension Neg Hx   ? Malignant hyperthermia Neg Hx   ? Pseudochol deficiency Neg Hx   ?  ?Past Surgical History:  ?Procedure Laterality Date  ? AMPUTATION Right 01/13/2020  ? Procedure: AMPUTATION RIGHT FOOT 1ST RAY;  Surgeon: Newt Minion, MD;  Location: [place];  Service: Orthopedics;  Laterality: Right;  ? AV FISTULA PLACEMENT Right 09/08/2013  ? Procedure: CREATION OF RIGHT BRACHIAL CEPHALIC ARTERIOVENOUS FISTULA ;  Surgeon: Mal Misty, MD;  Location: [place];  Service: Vascular;  Laterality: Right;  ? BASCILIC VEIN TRANSPOSITION Right 01/26/2014  ? Procedure: Right Arm BASILIC VEIN TRANSPOSITION;  Surgeon: Mal Misty, MD;   Location: [place];  Service: Vascular;  Laterality: Right;  ? BIOPSY  09/10/2021  ? Procedure: BIOPSY;  Surgeon: Harvel Quale, MD;  [facility];  Service: Gastroenterology;;  gastric  ? CATAR

## 2021-10-02 DIAGNOSIS — Z992 Dependence on renal dialysis: Secondary | ICD-10-CM | POA: Diagnosis not present

## 2021-10-02 DIAGNOSIS — N186 End stage renal disease: Secondary | ICD-10-CM | POA: Diagnosis not present

## 2021-10-04 DIAGNOSIS — N186 End stage renal disease: Secondary | ICD-10-CM | POA: Diagnosis not present

## 2021-10-04 DIAGNOSIS — Z992 Dependence on renal dialysis: Secondary | ICD-10-CM | POA: Diagnosis not present

## 2021-10-07 ENCOUNTER — Encounter (HOSPITAL_COMMUNITY)
Admission: RE | Disposition: A | Discharge status: Home / Self Care | Payer: Self-pay | Source: Home / Self Care | Attending: Gastroenterology

## 2021-10-07 ENCOUNTER — Encounter (HOSPITAL_COMMUNITY): Payer: Self-pay | Admitting: Gastroenterology

## 2021-10-07 ENCOUNTER — Other Ambulatory Visit: Payer: Self-pay

## 2021-10-07 ENCOUNTER — Other Ambulatory Visit: Payer: Self-pay | Admitting: Gastroenterology

## 2021-10-07 ENCOUNTER — Ambulatory Visit (HOSPITAL_BASED_OUTPATIENT_CLINIC_OR_DEPARTMENT_OTHER): Payer: Medicare Other | Admitting: Certified Registered Nurse Anesthetist

## 2021-10-07 ENCOUNTER — Ambulatory Visit (HOSPITAL_COMMUNITY)
Admission: RE | Admit: 2021-10-07 | Discharge: 2021-10-07 | Disposition: A | Discharge status: Home / Self Care | Payer: Medicare Other | Attending: Gastroenterology | Admitting: Gastroenterology

## 2021-10-07 ENCOUNTER — Ambulatory Visit (HOSPITAL_COMMUNITY): Payer: Medicare Other | Admitting: Certified Registered Nurse Anesthetist

## 2021-10-07 DIAGNOSIS — D3A8 Other benign neuroendocrine tumors: Secondary | ICD-10-CM | POA: Insufficient documentation

## 2021-10-07 DIAGNOSIS — K929 Disease of digestive system, unspecified: Secondary | ICD-10-CM | POA: Diagnosis not present

## 2021-10-07 DIAGNOSIS — I899 Noninfective disorder of lymphatic vessels and lymph nodes, unspecified: Secondary | ICD-10-CM | POA: Insufficient documentation

## 2021-10-07 DIAGNOSIS — D490 Neoplasm of unspecified behavior of digestive system: Secondary | ICD-10-CM

## 2021-10-07 DIAGNOSIS — N189 Chronic kidney disease, unspecified: Secondary | ICD-10-CM | POA: Insufficient documentation

## 2021-10-07 DIAGNOSIS — K222 Esophageal obstruction: Secondary | ICD-10-CM | POA: Diagnosis not present

## 2021-10-07 DIAGNOSIS — K219 Gastro-esophageal reflux disease without esophagitis: Secondary | ICD-10-CM | POA: Insufficient documentation

## 2021-10-07 DIAGNOSIS — I120 Hypertensive chronic kidney disease with stage 5 chronic kidney disease or end stage renal disease: Secondary | ICD-10-CM | POA: Insufficient documentation

## 2021-10-07 DIAGNOSIS — I69398 Other sequelae of cerebral infarction: Secondary | ICD-10-CM | POA: Diagnosis not present

## 2021-10-07 DIAGNOSIS — K3189 Other diseases of stomach and duodenum: Secondary | ICD-10-CM | POA: Insufficient documentation

## 2021-10-07 DIAGNOSIS — Z794 Long term (current) use of insulin: Secondary | ICD-10-CM | POA: Diagnosis not present

## 2021-10-07 DIAGNOSIS — R1310 Dysphagia, unspecified: Secondary | ICD-10-CM | POA: Insufficient documentation

## 2021-10-07 DIAGNOSIS — D3A010 Benign carcinoid tumor of the duodenum: Secondary | ICD-10-CM | POA: Diagnosis not present

## 2021-10-07 DIAGNOSIS — E1122 Type 2 diabetes mellitus with diabetic chronic kidney disease: Secondary | ICD-10-CM | POA: Diagnosis not present

## 2021-10-07 DIAGNOSIS — Z8673 Personal history of transient ischemic attack (TIA), and cerebral infarction without residual deficits: Secondary | ICD-10-CM | POA: Insufficient documentation

## 2021-10-07 DIAGNOSIS — K2970 Gastritis, unspecified, without bleeding: Secondary | ICD-10-CM | POA: Insufficient documentation

## 2021-10-07 DIAGNOSIS — K449 Diaphragmatic hernia without obstruction or gangrene: Secondary | ICD-10-CM | POA: Diagnosis not present

## 2021-10-07 DIAGNOSIS — K8010 Calculus of gallbladder with chronic cholecystitis without obstruction: Secondary | ICD-10-CM | POA: Diagnosis not present

## 2021-10-07 DIAGNOSIS — Q399 Congenital malformation of esophagus, unspecified: Secondary | ICD-10-CM | POA: Diagnosis not present

## 2021-10-07 DIAGNOSIS — Z79899 Other long term (current) drug therapy: Secondary | ICD-10-CM | POA: Insufficient documentation

## 2021-10-07 DIAGNOSIS — K8020 Calculus of gallbladder without cholecystitis without obstruction: Secondary | ICD-10-CM | POA: Diagnosis not present

## 2021-10-07 HISTORY — PX: HEMOSTASIS CLIP PLACEMENT: SHX6857

## 2021-10-07 HISTORY — PX: ESOPHAGOGASTRODUODENOSCOPY (EGD) WITH PROPOFOL: SHX5813

## 2021-10-07 HISTORY — PX: EUS: SHX5427

## 2021-10-07 HISTORY — PX: ENDOSCOPIC MUCOSAL RESECTION: SHX6839

## 2021-10-07 HISTORY — PX: SUBMUCOSAL LIFTING INJECTION: SHX6855

## 2021-10-07 HISTORY — PX: GASTRIC VARICES BANDING: SHX5519

## 2021-10-07 HISTORY — PX: BIOPSY: SHX5522

## 2021-10-07 LAB — GLUCOSE, CAPILLARY: Glucose-Capillary: 175 mg/dL — ABNORMAL HIGH (ref 70–99)

## 2021-10-07 LAB — POCT I-STAT, CHEM 8
BUN: 42 mg/dL — ABNORMAL HIGH (ref 8–23)
BUN: 70 mg/dL — ABNORMAL HIGH (ref 8–23)
Calcium, Ion: 1 mmol/L — ABNORMAL LOW (ref 1.15–1.40)
Calcium, Ion: 0.9 mmol/L — ABNORMAL LOW (ref 1.15–1.40)
Chloride: 105 mmol/L (ref 98–111)
Chloride: 104 mmol/L (ref 98–111)
Creatinine, Ser: 8.6 mg/dL — ABNORMAL HIGH (ref 0.44–1.00)
Creatinine, Ser: 9.6 mg/dL — ABNORMAL HIGH (ref 0.44–1.00)
Glucose, Bld: 160 mg/dL — ABNORMAL HIGH (ref 70–99)
Glucose, Bld: 177 mg/dL — ABNORMAL HIGH (ref 70–99)
HCT: 35 % — ABNORMAL LOW (ref 36.0–46.0)
HCT: 39 % (ref 36.0–46.0)
Hemoglobin: 11.9 g/dL — ABNORMAL LOW (ref 12.0–15.0)
Hemoglobin: 13.3 g/dL (ref 12.0–15.0)
Potassium: 4.5 mmol/L (ref 3.5–5.1)
Potassium: 7.5 mmol/L (ref 3.5–5.1)
Sodium: 143 mmol/L (ref 135–145)
Sodium: 137 mmol/L (ref 135–145)
TCO2: 27 mmol/L (ref 22–32)
TCO2: 29 mmol/L (ref 22–32)

## 2021-10-07 SURGERY — ESOPHAGOGASTRODUODENOSCOPY (EGD) WITH PROPOFOL
Anesthesia: Monitor Anesthesia Care

## 2021-10-07 MED ORDER — SODIUM CHLORIDE 0.9 % IV SOLN: INTRAVENOUS | Status: DC

## 2021-10-07 MED ORDER — GLUCAGON HCL RDNA (DIAGNOSTIC) 1 MG IJ SOLR
INTRAMUSCULAR | Status: DC | PRN
  Administered 2021-10-07: .25 mg via INTRAVENOUS

## 2021-10-07 MED ORDER — PROPOFOL 10 MG/ML IV BOLUS
INTRAVENOUS | Status: DC | PRN
  Administered 2021-10-07 (×5): 10 mg via INTRAVENOUS
  Administered 2021-10-07 (×3): 20 mg via INTRAVENOUS
  Administered 2021-10-07 (×2): 10 mg via INTRAVENOUS

## 2021-10-07 MED ORDER — PROPOFOL 10 MG/ML IV BOLUS
INTRAVENOUS | Status: AC
  Filled 2021-10-07: qty 20

## 2021-10-07 MED ORDER — ONDANSETRON HCL 4 MG/2ML IJ SOLN
INTRAMUSCULAR | Status: DC | PRN
  Administered 2021-10-07: 4 mg via INTRAVENOUS

## 2021-10-07 MED ORDER — PROPOFOL 500 MG/50ML IV EMUL
INTRAVENOUS | Status: DC | PRN
Start: 2021-10-07 — End: 2021-10-07
  Administered 2021-10-07: 100 ug/kg/min via INTRAVENOUS
  Administered 2021-10-07: 80 ug/kg/min via INTRAVENOUS

## 2021-10-07 MED ORDER — OMEPRAZOLE 40 MG PO CPDR: 40.0000 mg | DELAYED_RELEASE_CAPSULE | Freq: Every day | ORAL | 6 refills | Status: DC

## 2021-10-07 MED ORDER — SUCRALFATE 1 GM/10ML PO SUSP: 1.0000 g | Freq: Two times a day (BID) | ORAL | 0 refills | Status: DC

## 2021-10-07 MED ORDER — LIDOCAINE HCL (CARDIAC) PF 100 MG/5ML IV SOSY
PREFILLED_SYRINGE | INTRAVENOUS | Status: DC | PRN
  Administered 2021-10-07: 100 mg via INTRAVENOUS

## 2021-10-07 MED ORDER — OMEPRAZOLE 40 MG PO CPDR
40.0000 mg | DELAYED_RELEASE_CAPSULE | Freq: Two times a day (BID) | ORAL | 6 refills | Status: DC
Start: 2021-10-07 — End: 2022-04-16

## 2021-10-07 SURGICAL SUPPLY — 15 items

## 2021-10-07 NOTE — Op Note (Addendum)
[facility] ?Patient Name: [name] ?Procedure Date: 10/07/2021 ?MRN: [id number] ?Attending MD: Justice Britain , MD ?Date of Birth: [birth year] ?CSN: [id number] ?Age: 81 ?Admit Type: Outpatient ?Procedure:                Upper EUS ?Indications:              Submucosal tumor versus extrinsic mass found on  ?                          endoscopy, Dysphagia, Neuroendocrine Tumor ?Providers:                Justice Britain, MD, Burtis Junes, RN, Hinton Dyer  ?                          Technician, Technician ?Referring MD:             Maylon Peppers, Barrie Folk. Hill MD, MD ?Medicines:                Monitored Anesthesia Care ?Complications:            No immediate complications. ?Estimated Blood Loss:     Estimated blood loss was minimal. ?Procedure:                Pre-Anesthesia Assessment: ?                          - Prior to the procedure, a History and Physical  ?                          was performed, and patient medications and  ?                          allergies were reviewed. The patient's tolerance of  ?                          previous anesthesia was also reviewed. The risks  ?                          and benefits of the procedure and the sedation  ?                          options and risks were discussed with the patient.  ?                          All questions were answered, and informed consent  ?                          was obtained. Prior Anticoagulants: The patient has  ?                          taken no previous anticoagulant or antiplatelet  ?                          agents except for aspirin. ASA Grade Assessment:  ?  III - A patient with severe systemic disease. After  ?                          reviewing the risks and benefits, the patient was  ?                          deemed in satisfactory condition to undergo the  ?                          procedure. ?                          After obtaining informed consent, the endoscope was  ?                           passed under direct vision. Throughout the  ?                          procedure, the patient's blood pressure, pulse, and  ?                          oxygen saturations were monitored continuously. The  ?                          [id number] ([id number]) Olympus therapeutic endoscope  ?                          was introduced through the mouth, and advanced to  ?                          the second part of duodenum. The [id number]-[id number]  ?                          ([id number]) Olympus radial ultrasound scope was  ?                          introduced through the mouth, and advanced to the  ?                          duodenum for ultrasound examination from the  ?                          stomach and duodenum. The upper EUS was somewhat  ?                          difficult due to a J-shaped stomach which made  ?                          pyloric intubation difficult but also stabilization  ?                          for resection due to the region of the duodenal  ?  nodule in proximity to the pylorus. Successful  ?                          completion of the procedure was aided by changing  ?                          the patient to a supine position, using scope  ?                          torsion and performing the maneuvers documented  ?                          (below) in this report. The patient tolerated the  ?                          procedure. ?Scope In: ?Scope Out: ?Findings: ?     ENDOSCOPIC FINDING: : ?     The examined esophagus was significantly tortuous. ?     A low-grade of narrowing Schatzki ring was found at the gastroesophageal  ?     junction. This was biopsied with a cold forceps for disruption purposes  ?     to aid in dysphagia symptoms. ?     The Z-line was regular and was found 36 cm from the incisors. ?     A 4 cm hiatal hernia was present. ?     Patchy mild inflammation characterized by erythema and friability was  ?     found in the entire examined stomach  - previously biopsied and negative  ?     for HP. ?     A J-shaped deformity was found of the entire examined stomach. ?     A single 8 mm submucosal nodule was found in the duodenal bulb within 1  ?     cm of the pylorus. After the EUS was completed as below, preparations  ?     were made for attempt at mucosal resection. NBI imaging and [place]  ?     endoscopy was done to demarcate the borders of the lesion. Everlift was  ?     injected to raise the lesion. Band ligator and snare mucosal resection  ?     was performed. Resection and retrieval were complete. To prevent  ?     bleeding after mucosal resection, three hemostatic clips were  ?     successfully placed (MR conditional). There was no bleeding at the end  ?     of the procedure. ?     No other gross lesions were noted in the duodenal bulb, in the first  ?     portion of the duodenum and in the second portion of the duodenum. ?     ENDOSONOGRAPHIC FINDING: : ?     An oval intramural (subepithelial) lesion was found in the duodenal  ?     bulb. The lesion was hypoechoic. Endosonographically, the lesion  ?     appeared to originate from within the deep mucosa (Layer 2). The lesion  ?     measured 8 mm (in maximum thickness). The lesion also measured 5 mm in  ?     diameter. The outer margins were smooth. ?     No malignant-appearing lymph nodes  were visualized in the gastrohepatic  ?     ligament (level 18), perigastric region, porta hepatis region and  ?     upper-abdominal periaortic region. ?     Multiple stones were visualized endosonographically in the gallbladder.  ?     The stones were round. They were hyperechoic and characterized by  ?     shadowing. ?     Endosonographic imaging in the visualized portion of the liver showed no  ?     mass. ?     The celiac region was visualized. ?Impression:               EGD Impression: ?                          - Tortuous esophagus. Low-grade of narrowing  ?                          Schatzki ring -  disrupted. Z-line regular, 36 cm  ?                          from the incisors. ?                          - 4 cm hiatal hernia. ?                          - Gastritis - previously HP negative on biopsies.  ?                          J-shaped deformity of the entire stomach. ?                          - Submucosal nodule found in the duodenum. After  ?                          EUS, and based on prior pathology, Cap-Lift-Band  ?                          EMR performed. Clips (MR conditional) were placed. ?                          - No other gross lesions in the duodenal bulb, in  ?                          the first portion of the duodenum and in the second  ?                          portion of the duodenum. ?                          EUS Impression: ?                          - An intramural (subepithelial) lesion was found in  ?  the duodenal bulb. The lesion appeared to originate  ?                          from within the deep mucosa (Layer 2). A tissue  ?                          diagnosis was obtained prior to this exam. This is  ?                          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 ?                          - No malignant-appearing lymph nodes were  ?                          visualized in the gastrohepatic ligament (level  ?                          18), perigastric region, porta hepatis region and  ?                          upper-abdominal periaortic region. ?                          - Multiple stones were visualized  ?                          endosonographically in the gallbladder. ?Moderate Sedation: ?     Not Applicable - Patient had care per Anesthesia. ?Recommendation:           - The patient will be observed post-procedure,  ?                          until all discharge criteria are met. ?                          - Discharge patient to home. ?                          - Patient has a contact number available for   ?                          emergencies. The signs and symptoms of potential  ?                          delayed complications were discussed with the  ?                          patient. Return to normal acti

## 2021-10-07 NOTE — H&P (Signed)
? ?GASTROENTEROLOGY PROCEDURE H&P NOTE  ? ?Primary Care Physician: ?Barbara Beard, MD ? ?HPI: ?[name] is a 81 y.o. 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 ? ?Past Medical History:  ?Diagnosis Date  ? Anemia   ? Cataract   ? Chronic kidney disease   ? CVA (cerebral infarction)   ? 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 Dialysis patient [facility])   ? M, W, F  ? Fistula   ? R arm  ? GERD (gastroesophageal reflux disease)   ? Hypertension   ? Renal disorder   ? Shortness of breath   ? Stroke [facility])   ? right side weakness  ? ?Past Surgical History:  ?Procedure Laterality Date  ? AMPUTATION Right 01/13/2020  ? Procedure: AMPUTATION RIGHT FOOT 1ST RAY;  Surgeon: Newt Minion, MD;  Location: [place];  Service: Orthopedics;  Laterality: Right;  ? AV FISTULA PLACEMENT Right 09/08/2013  ? Procedure: CREATION OF RIGHT BRACHIAL CEPHALIC ARTERIOVENOUS FISTULA ;  Surgeon: Mal Misty, MD;  Location: [place];  Service: Vascular;  Laterality: Right;  ? BASCILIC VEIN TRANSPOSITION Right 01/26/2014  ? Procedure: Right Arm BASILIC VEIN TRANSPOSITION;  Surgeon: Mal Misty, MD;  Location: [place];  Service: Vascular;  Laterality: Right;  ? BIOPSY  09/10/2021  ? Procedure: BIOPSY;  Surgeon: Harvel Quale, MD;  [facility];  Service: Gastroenterology;;  gastric  ? CATARACT EXTRACTION W/PHACO  11/20/2011  ? Procedure: CATARACT EXTRACTION PHACO AND INTRAOCULAR LENS PLACEMENT (IOC);  Surgeon: Tonny Branch, MD;  Location: [facility];  Service: Ophthalmology;  Laterality: Right;  CDE 18.82  ? CATARACT EXTRACTION W/PHACO Left 11/18/2012  ? Procedure: CATARACT EXTRACTION PHACO AND INTRAOCULAR LENS PLACEMENT (IOC);  Surgeon: Tonny Branch, MD;  [facility]: [facility];  Service: Ophthalmology;  Laterality: Left;  CDE: 18.97  ? COLONOSCOPY N/A 02/09/2013  ? Procedure: COLONOSCOPY;  Surgeon: Rogene Houston, MD;   [facility];  Service: Endoscopy;  Laterality: N/A;  305-moved to 220 Ann to notify pt  ? COLONOSCOPY N/A 07/08/2018  ? Procedure: COLONOSCOPY;  Surgeon: Rogene Houston, MD;  [facility];  Service: Endoscopy;  Laterality: N/A;  930  ? ESOPHAGOGASTRODUODENOSCOPY (EGD) WITH PROPOFOL N/A 09/10/2021  ? Procedure: ESOPHAGOGASTRODUODENOSCOPY (EGD) WITH PROPOFOL;  Surgeon: Harvel Quale, MD;  [facility];  Service: Gastroenterology;  Laterality: N/A;  ? INSERTION OF DIALYSIS CATHETER Right 06/24/2013  ? Procedure: INSERTION OF DIALYSIS CATHETER: Ultrasound guided;  Surgeon: Serafina Mitchell, MD;  Location: [place];  Service: Vascular;  Laterality: Right;  ? LIGATION OF ARTERIOVENOUS  FISTULA Right 01/26/2014  ? Procedure: LIGATION OF ARTERIOVENOUS  FISTULA;  Surgeon: Mal Misty, MD;  Location: [place];  Service: Vascular;  Laterality: Right;  ? LOOP RECORDER IMPLANT N/A 07/21/2013  ? Procedure: LOOP RECORDER IMPLANT;  Surgeon: Evans Lance, MD;  Location: [facility] CATH LAB;  Service: Cardiovascular;  Laterality: N/A;  ? POLYPECTOMY  07/08/2018  ? Procedure: POLYPECTOMY;  Surgeon: Rogene Houston, MD;  [facility];  Service: Endoscopy;;  Descending colon polyps x 2   ? TEE WITHOUT CARDIOVERSION N/A 07/21/2013  ? Procedure: TRANSESOPHAGEAL ECHOCARDIOGRAM (TEE);  Surgeon: Dorothy Spark, MD;  Location: [place];  Service: Cardiovascular;  Laterality: N/A;  ? ?No current facility-administered medications for this encounter.  ? ?No  current facility-administered medications for this encounter. ?Allergies  ?Allergen Reactions  ? Ambien [Zolpidem] Other (See Comments)  ?  Hallucinations   ? Reglan [Metoclopramide] Other (See Comments)  ?  "makes me crazy"  ? ?Family History  ?Problem Relation Age of Onset  ? Cancer Sister   ? Cancer Brother   ? Anesthesia problems Neg Hx   ? Hypotension Neg Hx   ? Malignant hyperthermia Neg Hx   ? Pseudochol deficiency Neg Hx   ? ?Social  History  ? ?Socioeconomic History  ? Marital status: Widowed  ?  Spouse name: Not on file  ? Number of children: Not on file  ? Years of education: Not on file  ? Highest education level: Not on file  ?Occupational History  ? Not on file  ?Tobacco Use  ? Smoking status: Never  ? Smokeless tobacco: Never  ?Vaping Use  ? Vaping Use: Never used  ?Substance and Sexual Activity  ? Alcohol use: No  ? Drug use: No  ? Sexual activity: Not on file  ?Other Topics Concern  ? Not on file  ?Social History Narrative  ? ** Merged History Encounter **  ?    ? ?Social Determinants of Health  ? ?Financial Resource Strain: Not on file  ?Food Insecurity: Not on file  ?Transportation Needs: Not on file  ?Physical Activity: Not on file  ?Stress: Not on file  ?Social Connections: Not on file  ?Intimate Partner Violence: Not on file  ? ? ?Physical Exam: ?There were no vitals filed for this visit. ?There is no height or weight on file to calculate BMI. ?GEN: NAD ?EYE: Sclerae anicteric ?ENT: MMM ?CV: Non-tachycardic ?GI: Soft, NT/ND ?NEURO:  Alert & Oriented x 3 ? ?Lab Results: ?No results for input(s): WBC, HGB, HCT, PLT in the last 72 hours. ?BMET ?No results for input(s): NA, K, CL, CO2, GLUCOSE, BUN, CREATININE, CALCIUM in the last 72 hours. ?LFT ?No results for input(s): PROT, ALBUMIN, AST, ALT, ALKPHOS, BILITOT, BILIDIR, IBILI in the last 72 hours. ?PT/INR ?No results for input(s): LABPROT, INR in the last 72 hours. ? ? 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 ? ?The risks of an EUS including intestinal perforation, bleeding, infection, aspiration, and medication effects were discussed as was the possibility it may not give a definitive diagnosis if a biopsy is performed.  When a biopsy of the pancreas is done as part of the EUS, there is an additional risk of pancreatitis at the rate of about 1-2%.  It  was explained that procedure related pancreatitis is typically mild, although it can be severe and even life threatening, which is why we do not perform random pancreatic biopsies and only biopsy a lesion/area we feel is concerning enough to warrant the risk. ? ?Based upon the description and endoscopic pictures I do feel that it is reasonable to pursue an Advanced Polypectomy attempt of the polyp/lesion.  We discussed some of the techniques of advanced polypectomy which include Endoscopic Mucosal Resection, OVESCO Full-Thickness Resection, Endorotor Morcellation, and Tissue Ablation via Fulguration.  We also reviewed images of typical techniques as noted above.  The risks and benefits of endoscopic evaluation were discussed with the patient; these include but are not limited to the risk of perforation, infection, bleeding, missed lesions, lack of diagnosis, severe illness requiring hospitalization, as well as anesthesia and sedation related  illnesses.  During attempts at advanced resection, the risks of bleeding and perforation/leak are increased as opposed to diagnostic and screening procedures, and that was discussed with the patient as well.   In addition, I explained that with the possible need for piecemeal resection, subsequent short-interval endoscopic evaluation for follow up and potential retreatment of the lesion/area may be necessary.  If, after attempt at removal of the polyp/lesion, it is found that the patient has a complication or that an invasive lesion or malignant lesion is found, or that the polyp/lesion continues to recur, the patient is aware and understands that surgery may still be indicated/required.  All patient questions were answered, to the best of my ability, and the patient agrees to the aforementioned plan of action with follow-up as indicated. ? ?The risks and benefits of endoscopic evaluation/treatment were discussed with the patient and/or family; these include but are not limited  to the risk of perforation, infection, bleeding, missed lesions, lack of diagnosis, severe illness requiring hospitalization, as well as anesthesia and sedation related illnesses.  The patient's history has

## 2021-10-07 NOTE — Anesthesia Preprocedure Evaluation (Signed)
Anesthesia Evaluation  ? ? ?Reviewed: ?Allergy & Precautions, Patient's Chart, lab work & pertinent test results ? ?Airway ?Mallampati: II ? ?TM Distance: >3 FB ?Neck ROM: Full ? ? ? Dental ? ?(+) Edentulous Upper, Edentulous Lower ?  ?Pulmonary ?shortness of breath,  ?  ?Pulmonary exam normal ? ? ? ? ? ? ? Cardiovascular ?hypertension, negative cardio ROS ?Normal cardiovascular exam ? ? ?  ?Neuro/Psych ?Right hemiparesis  ? Neuromuscular disease CVA, Residual Symptoms   ? GI/Hepatic ?Neg liver ROS, GERD  Medicated and Controlled,  ?Endo/Other  ?diabetes, Insulin Dependent ? Renal/GU ?ESRF and DialysisRenal disease  ? ?  ?Musculoskeletal ?Ambulates with walker  ? Abdominal ?  ?Peds ? 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 Reproductive/Obstetrics ? ?  ? ? ? ? ? ? ? ? ? ? ? ? ? ?  ?  ? ? ? ? ? ? ? ? ?Anesthesia Physical ?Anesthesia Plan ? ?ASA: 3 ? ?Anesthesia Plan: MAC  ? ?Post-op Pain Management:   ? ?Induction: Intravenous ? ?PONV Risk Score and Plan: 2 and Propofol infusion and Treatment may vary due to age or medical condition ? ?Airway Management Planned: Nasal Cannula ? ?Additional Equipment:  ? ?Intra-op Plan:  ? ?Post-operative Plan:  ? ?Informed Consent: I have reviewed the patients History and Physical, chart, labs and discussed the procedure including the risks, benefits and alternatives for the proposed anesthesia with the patient or authorized representative who has indicated his/her understanding and acceptance.  ? ? ? ? ? ?Plan Discussed with: CRNA ? ?Anesthesia Plan Comments:   ? ? ? ? ? ? ?Anesthesia Quick Evaluation ? ?

## 2021-10-07 NOTE — Transfer of Care (Signed)
Immediate Anesthesia Transfer of Care Note ? ?Patient: [name] ? ?Procedure(s) Performed: ESOPHAGOGASTRODUODENOSCOPY (EGD) WITH PROPOFOL ?UPPER ENDOSCOPIC ULTRASOUND (EUS) RADIAL ?ENDOSCOPIC MUCOSAL RESECTION ?SUBMUCOSAL LIFTING INJECTION ?HEMOSTASIS CLIP PLACEMENT ?BIOPSY ?duodenal tumor BANDING ? ?Patient Location: Endoscopy Unit ? ?Anesthesia Type:MAC ? ?Level of Consciousness: awake, alert , oriented, drowsy and patient cooperative ? ?Airway & Oxygen Therapy: Patient Spontanous Breathing ? ?Post-op Assessment: Report given to RN and Post -op Vital signs reviewed and stable ? ?Post vital signs: Reviewed and stable ? ?Last Vitals:  ?Vitals Value Taken Time  ?BP 143/95 10/07/21 1401  ?Temp 36.3 ?C 10/07/21 1403  ?Pulse 74 10/07/21 1401  ?Resp 19 10/07/21 1401  ?SpO2 100 % 10/07/21 1401  ? ? ?Last Pain:  ?Vitals:  ? 10/07/21 1401  ?TempSrc: Temporal  ?PainSc: Asleep  ?   ? ?  ? ?Complications: No notable events documented. ?

## 2021-10-07 NOTE — Anesthesia Postprocedure Evaluation (Signed)
Anesthesia Post Note ? ?Patient: EULINE KIMBLER ? ?Procedure(s) Performed: ESOPHAGOGASTRODUODENOSCOPY (EGD) WITH PROPOFOL ?UPPER ENDOSCOPIC ULTRASOUND (EUS) RADIAL ?ENDOSCOPIC MUCOSAL RESECTION ?SUBMUCOSAL LIFTING INJECTION ?HEMOSTASIS CLIP PLACEMENT ?BIOPSY ?duodenal tumor BANDING ? ?  ? ?Patient location during evaluation: Endoscopy ?Anesthesia Type: MAC ?Level of consciousness: awake ?Pain management: pain level controlled ?Vital Signs Assessment: post-procedure vital signs reviewed and stable ?Respiratory status: spontaneous breathing, nonlabored ventilation, respiratory function stable and patient connected to nasal cannula oxygen ?Cardiovascular status: stable and blood pressure returned to baseline ?Postop Assessment: no apparent nausea or vomiting ?Anesthetic complications: no ? ? ?No notable events documented. ? ?Last Vitals:  ?Vitals:  ? 10/07/21 1420 10/07/21 1430  ?BP: (!) 121/57 (!) 141/45  ?Pulse: 69 66  ?Resp: 14 17  ?Temp:    ?SpO2: 97% 99%  ?  ?Last Pain:  ?Vitals:  ? 10/07/21 1430  ?TempSrc:   ?PainSc: 0-No pain  ? ? ?  ?  ?  ?  ?  ?  ? ?Aleanna Menge P Erza [place] ? ? ? ? ?

## 2021-10-07 NOTE — Discharge Instructions (Signed)
YOU HAD AN ENDOSCOPIC PROCEDURE TODAY: Refer to the procedure report and other information in the discharge instructions given to you for any specific questions about what was found during the examination. If this information does not answer your questions, please call [place] office at [phone] to clarify.   YOU SHOULD EXPECT: Some feelings of bloating in the abdomen. Passage of more gas than usual. Walking can help get rid of the air that was put into your GI tract during the procedure and reduce the bloating. If you had a lower endoscopy (such as a colonoscopy or flexible sigmoidoscopy) you may notice spotting of blood in your stool or on the toilet paper. Some abdominal soreness may be present for a day or two, also.  DIET: Your first meal following the procedure should be a light meal and then it is ok to progress to your normal diet. A half-sandwich or bowl of soup is an example of a good first meal. Heavy or fried foods are harder to digest and may make you feel nauseous or bloated. Drink plenty of fluids but you should avoid alcoholic beverages for 24 hours. If you had a esophageal dilation, please see attached instructions for diet.    ACTIVITY: Your care partner should take you home directly after the procedure. You should plan to take it easy, moving slowly for the rest of the day. You can resume normal activity the day after the procedure however YOU SHOULD NOT DRIVE, use power tools, machinery or perform tasks that involve climbing or major physical exertion for 24 hours (because of the sedation medicines used during the test).   SYMPTOMS TO REPORT IMMEDIATELY: A gastroenterologist can be reached at any hour. Please call [phone]  for any of the following symptoms:   Following upper endoscopy (EGD, EUS, ERCP, esophageal dilation) Vomiting of blood or coffee ground material  New, significant abdominal pain  New, significant chest pain or pain under the shoulder blades  Painful or  persistently difficult swallowing  New shortness of breath  Black, tarry-looking or red, bloody stools  FOLLOW UP:  If any biopsies were taken you will be contacted by phone or by letter within the next 1-3 weeks. Call [phone]  if you have not heard about the biopsies in 3 weeks.  Please also call with any specific questions about appointments or follow up tests.  

## 2021-10-08 DIAGNOSIS — N186 End stage renal disease: Secondary | ICD-10-CM | POA: Diagnosis not present

## 2021-10-08 DIAGNOSIS — Z992 Dependence on renal dialysis: Secondary | ICD-10-CM | POA: Diagnosis not present

## 2021-10-09 ENCOUNTER — Encounter: Payer: Self-pay | Admitting: Gastroenterology

## 2021-10-09 ENCOUNTER — Telehealth: Payer: Self-pay | Admitting: Gastroenterology

## 2021-10-09 ENCOUNTER — Other Ambulatory Visit: Payer: Self-pay

## 2021-10-09 ENCOUNTER — Encounter (HOSPITAL_COMMUNITY): Payer: Self-pay | Admitting: Gastroenterology

## 2021-10-09 DIAGNOSIS — N186 End stage renal disease: Secondary | ICD-10-CM | POA: Diagnosis not present

## 2021-10-09 DIAGNOSIS — D3A8 Other benign neuroendocrine tumors: Secondary | ICD-10-CM

## 2021-10-09 DIAGNOSIS — Z992 Dependence on renal dialysis: Secondary | ICD-10-CM | POA: Diagnosis not present

## 2021-10-09 LAB — SURGICAL PATHOLOGY

## 2021-10-09 NOTE — Telephone Encounter (Signed)
The pt's daughter has been advised that she can bring the pt in for labs tomorrow at our basement lab ?

## 2021-10-09 NOTE — Telephone Encounter (Signed)
Patient daughter called seeking advise. Per daughter, patient was told to have labs done during dialysis, however, dialysis nurse states that couldn't be done. Patient daughter would like to know if they can come tomorrow to have those labs drawn since they will be in the [place] area.  Please advise. ?

## 2021-10-10 ENCOUNTER — Other Ambulatory Visit: Payer: Medicare Other

## 2021-10-10 ENCOUNTER — Ambulatory Visit: Payer: Self-pay

## 2021-10-10 ENCOUNTER — Ambulatory Visit (INDEPENDENT_AMBULATORY_CARE_PROVIDER_SITE_OTHER): Payer: Medicare Other | Admitting: Physical Medicine and Rehabilitation

## 2021-10-10 ENCOUNTER — Encounter: Payer: Self-pay | Admitting: Physical Medicine and Rehabilitation

## 2021-10-10 VITALS — BP 166/69 | HR 71

## 2021-10-10 DIAGNOSIS — D3A8 Other benign neuroendocrine tumors: Secondary | ICD-10-CM

## 2021-10-10 DIAGNOSIS — M5416 Radiculopathy, lumbar region: Secondary | ICD-10-CM

## 2021-10-10 DIAGNOSIS — M48062 Spinal stenosis, lumbar region with neurogenic claudication: Secondary | ICD-10-CM

## 2021-10-10 MED ORDER — METHYLPREDNISOLONE ACETATE 80 MG/ML IJ SUSP
80.0000 mg | Freq: Once | INTRAMUSCULAR | Status: AC
  Administered 2021-10-10: 80 mg

## 2021-10-10 NOTE — Progress Notes (Signed)
Pt state lower back pain that travels to her right hip, leg and foot. Pt state walking and trying to standing makes the pain worse.  Pt state she takes pain meds to help ease her pain. ? ?Numeric Pain Rating Scale and Functional Assessment ?Average Pain 8 ? ? ?In the last MONTH (on 0-10 scale) has pain interfered with the following? ? ?1. General activity like being  able to carry out your everyday physical activities such as walking, climbing stairs, carrying groceries, or moving a chair?  ?Rating(10) ? ? ?+Driver, -BT, -Dye Allergies. ? ?

## 2021-10-10 NOTE — Procedures (Signed)
Lumbar Epidural Steroid Injection - Interlaminar Approach with Fluoroscopic Guidance ? ?Patient: [name]      ?Date of Birth: 16-Apr-1941 ?MRN: [id number] ?PCP: [name], MD      ?Visit Date: 10/10/2021 ?  ?Universal Protocol:    ? ?Consent Given By: the patient ? ?Position: PRONE ? ?Additional Comments: ?Vital signs were monitored before and after the procedure. ?Patient was prepped and draped in the usual sterile fashion. ?The correct patient, procedure, and site was verified. ? ? ?Injection Procedure Details:  ? ?Procedure diagnoses: Lumbar radiculopathy [M54.16]  ? ?Meds Administered:  ?Meds ordered this encounter  ?Medications  ? methylPREDNISolone acetate (DEPO-MEDROL) injection 80 mg  ?  ? ?Laterality: Right ? ?Location/Site:  L4-5 ? ?Needle: 3.5 in., 20 ga. Tuohy ? ?Needle Placement: Paramedian epidural ? ?Findings:  ? -Comments: Excellent flow of contrast into the epidural space. Flow was stenotic. ? ?Procedure Details: ?Using a paramedian approach from the side mentioned above, the region overlying the inferior lamina was localized under fluoroscopic visualization and the soft tissues overlying this structure were infiltrated with 4 ml. of 1% Lidocaine without Epinephrine. The Tuohy needle was inserted into the epidural space using a paramedian approach.  ? ?The epidural space was localized using loss of resistance along with counter oblique bi-planar fluoroscopic views.  After negative aspirate for air, blood, and CSF, a 2 ml. volume of Isovue-250 was injected into the epidural space and the flow of contrast was observed. Radiographs were obtained for documentation purposes.   ? ?The injectate was administered into the level noted above. ? ? ?Additional Comments:  ?The patient tolerated the procedure well ?Dressing: 2 x 2 sterile gauze and Band-Aid ?  ? ?Post-procedure details: ?Patient was observed during the procedure. ?Post-procedure instructions were reviewed. ? ?Patient left the clinic in  stable condition. ?

## 2021-10-10 NOTE — Patient Instructions (Signed)

## 2021-10-10 NOTE — Progress Notes (Signed)
? ?[name] - 81 y.o. female MRN [id number]  Date of birth: February 02, 1941 ? ?Office Visit Note: ?Visit Date: 10/10/2021 ?PCP: [name], MD ?Referred by: [name], MD ? ?Subjective: ?Chief Complaint  ?Patient presents with  ? Lower Back - Pain  ? Right Hip - Pain  ? Right Leg - Pain  ? Right Foot - Pain  ? ?HPI:  [name] is a 81 y.o. female who comes in today at the request of Barnet Pall, FNP for planned Right L4-5 Lumbar Interlaminar epidural steroid injection with fluoroscopic guidance.  The patient has failed conservative care including home exercise, medications, time and activity modification.  This injection will be diagnostic and hopefully therapeutic.  Please see requesting physician notes for further details and justification. MRI reviewed with images and spine model.  MRI reviewed in the note below.  ? ?ROS Otherwise per HPI. ? ?Assessment & Plan: ?Visit Diagnoses:  ?  ICD-10-CM   ?1. Lumbar radiculopathy  M54.16 XR C-ARM NO REPORT  ?  Epidural Steroid injection  ?  methylPREDNISolone acetate (DEPO-MEDROL) injection 80 mg  ?  ?2. Spinal stenosis of lumbar region with neurogenic claudication  M48.062 XR C-ARM NO REPORT  ?  Epidural Steroid injection  ?  methylPREDNISolone acetate (DEPO-MEDROL) injection 80 mg  ?  ?  ?Plan: No additional findings.  ? ?Meds & Orders:  ?Meds ordered this encounter  ?Medications  ? methylPREDNISolone acetate (DEPO-MEDROL) injection 80 mg  ?  ?Orders Placed This Encounter  ?Procedures  ? XR C-ARM NO REPORT  ? Epidural Steroid injection  ?  ?Follow-up: Return if symptoms worsen or fail to improve.  ? ?Procedures: ?No procedures performed  ?Lumbar Epidural Steroid Injection - Interlaminar Approach with Fluoroscopic Guidance ? ?Patient: [name]      ?Date of Birth: 1940/12/02 ?MRN: [id number] ?PCP: [name], MD      ?Visit Date: 10/10/2021 ?  ?Universal Protocol:    ? ?Consent Given By: the patient ? ?Position: PRONE ? ?Additional  Comments: ?Vital signs were monitored before and after the procedure. ?Patient was prepped and draped in the usual sterile fashion. ?The correct patient, procedure, and site was verified. ? ? ?Injection Procedure Details:  ? ?Procedure diagnoses: Lumbar radiculopathy [M54.16]  ? ?Meds Administered:  ?Meds ordered this encounter  ?Medications  ? methylPREDNISolone acetate (DEPO-MEDROL) injection 80 mg  ?  ? ?Laterality: Right ? ?Location/Site:  L4-5 ? ?Needle: 3.5 in., 20 ga. Tuohy ? ?Needle Placement: Paramedian epidural ? ?Findings:  ? -Comments: Excellent flow of contrast into the epidural space. Flow was stenotic. ? ?Procedure Details: ?Using a paramedian approach from the side mentioned above, the region overlying the inferior lamina was localized under fluoroscopic visualization and the soft tissues overlying this structure were infiltrated with 4 ml. of 1% Lidocaine without Epinephrine. The Tuohy needle was inserted into the epidural space using a paramedian approach.  ? ?The epidural space was localized using loss of resistance along with counter oblique bi-planar fluoroscopic views.  After negative aspirate for air, blood, and CSF, a 2 ml. volume of Isovue-250 was injected into the epidural space and the flow of contrast was observed. Radiographs were obtained for documentation purposes.   ? ?The injectate was administered into the level noted above. ? ? ?Additional Comments:  ?The patient tolerated the procedure well ?Dressing: 2 x 2 sterile gauze and Band-Aid ?  ? ?Post-procedure details: ?Patient was observed during the procedure. ?Post-procedure instructions were reviewed. ? ?Patient left the clinic  in stable condition.  ? ?Clinical History: ?MRI LUMBAR SPINE WITHOUT CONTRAST ?  ?TECHNIQUE: ?Multiplanar, multisequence MR imaging of the lumbar spine was ?performed. No intravenous contrast was administered. ?  ?COMPARISON:  CT 03/23/2020 ?  ?FINDINGS: ?Segmentation:  Standard. ?  ?Alignment: Grade 1  anterolisthesis of L4 on L5. Slight lumbar ?dextrocurvature. ?  ?Vertebrae: No fracture, evidence of discitis, or bone lesion. ?Superior endplate Schmorl's node of L1. ?  ?Conus medullaris and cauda equina: Conus extends to the T12-L1 ?level. Conus appears normal. Slight bunching of the cauda equina ?nerve roots above the L4-5 level of stenosis. ?  ?Paraspinal and other soft tissues: Colonic diverticulosis. ?Cortically based T2 hyperintense lesionswithin the bilateral ?kidneys, incompletely characterized, but most likely represent ?cysts. ?  ?Disc levels: ?  ?T12-L1: Minimal disc bulge. Unremarkable facet joints. No foraminal ?or canal stenosis. ?  ?L1-L2: Unremarkable disc. Minimal facet arthropathy. No foraminal or ?canal stenosis. ?  ?L2-L3: Mild annular disc bulge. Mild bilateral facet arthropathy. No ?canal stenosis. Borderline-mild right foraminal stenosis. ?  ?L3-L4: No disc protrusion. Mild bilateral facet arthropathy. No ?canal stenosis. Borderline-mild bilateral foraminal stenosis. ?  ?L4-L5: Anterolisthesis with disc uncovering and diffuse disc bulge. ?Severe bilateral facet arthropathy and ligamentum flavum buckling. ?Findings result in severe canal stenosis with severe left and ?moderate right foraminal stenosis. ?  ?L5-S1: Diffuse disc bulge, eccentric to the right with endplate ?osteophytic ridging. Mild bilateral facet arthropathy. Moderate ?right and mild left foraminal stenosis. No canal stenosis. ?  ?IMPRESSION: ?1. Multilevel lumbar spondylosis as described above, most pronounced ?at L4-5 where there is severe canal stenosis with severe left and ?moderate right foraminal stenosis. ?2. Moderate right and mild left foraminal stenosis at L5-S1. ?  ?  ?Electronically Signed ?  By: Davina Poke D.O. ?  On: 08/24/2021 20:31  ? ? ? ?Objective:  VS:  HT:    WT:   BMI:     BP:(!) 166/69  HR:71bpm  TEMP: ( )  RESP:  ?Physical Exam ?Vitals and nursing note reviewed.  ?Constitutional:   ?    General: She is not in acute distress. ?   Appearance: Normal appearance. She is not ill-appearing.  ?HENT:  ?   Head: Normocephalic and atraumatic.  ?   Right Ear: External ear normal.  ?   Left Ear: External ear normal.  ?Eyes:  ?   Extraocular Movements: Extraocular movements intact.  ?Cardiovascular:  ?   Rate and Rhythm: Normal rate.  ?   Pulses: Normal pulses.  ?Pulmonary:  ?   Effort: Pulmonary effort is normal. No respiratory distress.  ?Abdominal:  ?   General: There is no distension.  ?   Palpations: Abdomen is soft.  ?Musculoskeletal:     ?   General: Tenderness present.  ?   Cervical back: Neck supple.  ?   Right lower leg: No edema.  ?   Left lower leg: No edema.  ?   Comments: Patient has good distal strength with no pain over the greater trochanters.  No clonus or focal weakness.  ?Skin: ?   Findings: No erythema, lesion or rash.  ?Neurological:  ?   General: No focal deficit present.  ?   Mental Status: She is alert and oriented to person, place, and time.  ?   Sensory: No sensory deficit.  ?   Motor: No weakness or abnormal muscle tone.  ?   Coordination: Coordination normal.  ?   Gait: Gait abnormal.  ?Psychiatric:     ?  Mood and Affect: Mood normal.     ?   Behavior: Behavior normal.  ?  ? ?Imaging: ?XR C-ARM NO REPORT ? ?Result Date: 10/10/2021 ?Please see Notes tab for imaging impression.  ? ?

## 2021-10-11 DIAGNOSIS — N186 End stage renal disease: Secondary | ICD-10-CM | POA: Diagnosis not present

## 2021-10-11 DIAGNOSIS — Z992 Dependence on renal dialysis: Secondary | ICD-10-CM | POA: Diagnosis not present

## 2021-10-11 LAB — CHROMOGRANIN A: Chromogranin A (ng/mL): 634.2 ng/mL — ABNORMAL HIGH (ref 0.0–101.8)

## 2021-10-13 DIAGNOSIS — N186 End stage renal disease: Secondary | ICD-10-CM | POA: Diagnosis not present

## 2021-10-13 DIAGNOSIS — Z992 Dependence on renal dialysis: Secondary | ICD-10-CM | POA: Diagnosis not present

## 2021-10-14 ENCOUNTER — Other Ambulatory Visit: Payer: Self-pay

## 2021-10-14 DIAGNOSIS — Z992 Dependence on renal dialysis: Secondary | ICD-10-CM | POA: Diagnosis not present

## 2021-10-14 DIAGNOSIS — D3A8 Other benign neuroendocrine tumors: Secondary | ICD-10-CM

## 2021-10-14 DIAGNOSIS — N186 End stage renal disease: Secondary | ICD-10-CM | POA: Diagnosis not present

## 2021-10-15 ENCOUNTER — Ambulatory Visit (INDEPENDENT_AMBULATORY_CARE_PROVIDER_SITE_OTHER): Payer: Medicare Other | Admitting: Gastroenterology

## 2021-10-15 ENCOUNTER — Encounter (INDEPENDENT_AMBULATORY_CARE_PROVIDER_SITE_OTHER): Payer: Self-pay | Admitting: Gastroenterology

## 2021-10-15 VITALS — BP 132/64 | HR 74 | Temp 97.8°F | Ht 68.0 in | Wt 169.0 lb

## 2021-10-15 DIAGNOSIS — C7A8 Other malignant neuroendocrine tumors: Secondary | ICD-10-CM | POA: Diagnosis not present

## 2021-10-15 DIAGNOSIS — R1319 Other dysphagia: Secondary | ICD-10-CM

## 2021-10-15 DIAGNOSIS — K222 Esophageal obstruction: Secondary | ICD-10-CM | POA: Diagnosis not present

## 2021-10-15 DIAGNOSIS — D3A8 Other benign neuroendocrine tumors: Secondary | ICD-10-CM | POA: Insufficient documentation

## 2021-10-15 NOTE — Progress Notes (Signed)
? ?Referring Provider: [name], MD ?Primary Care Physician:  [name], MD ?Primary GI Physician: Jenetta Downer ? ?Chief Complaint  ?Patient presents with  ? 4 wk follow up  ?  Patient denies any new or worsening symptoms normal bowel movements, appetite is very good at this time    ? ?HPI:   ?[name] is a 81 y.o. female with past medical history of  anemia, CKD, CVA, diabetes, GERD, hypertension ? ?Patient presenting today for follow up of food impaction. ? ?Patient had hospitalization on 09/10/21 after presenting to ED for food impaction after eating breakfast and feeling that food would not go down. Denied any hx of similar symptoms. Patient underwent EGD under GA with findings as outlined below, referred to Dr. 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 Patient underwent EUS on 10/07/21 with resection of tumor, chromagranin A level: 634.2 on 10/10/21, patient scheduled for CT A/P w/contrast (11/21/21) and tentative recall for EUS in 6 months, recommendations to repeat Chromagranin A levels in 2 months and proceed with previously scheduled CT A/P, consider PET scan if CT abnormal.  ? ?States that swallowing is much improved, she has had no recurrence of dysphagia. States that she had a little bit of a sore throat after her initial EGD but this has resolved. She is eating anything she wants without any issue. She states that weight is stable. Denies nausea or vomiting. Denies rectal bleeding or black stools. Is maintained on omeprazole 35m twice a day. She reports that her blood sugar has been dropping quite a bit recently since her insulin was changed, is seeing endocrinology next week.  ? ? ?Last Colonoscopy:07/08/18 Diverticulosis in the entire examined colon. ?- Two 5 to 6 mm polyps in the proximal descending colon, removed with a cold snare. ?Resected and retrieved. ?- External hemorrhoids. ?EGD/EUS: 10/07/21  ?EGD Impression:  Tortuous esophagus. Low-grade of narrowing Schatzki ring - disrupted. Z-line regular, 36 ?cm from the incisors. ?- 4 cm hiatal hernia. ?- Gastritis - previously HP negative on biopsies. J-shaped deformity of the entire stomach. ?Submucosal nodule found in the duodenum. After EUS, and based on prior pathology, ?Cap-Lift-Band EMR performed. Clips (MR conditional) were placed. ?- No other gross lesions in the duodenal bulb, in the first portion of the duodenum and in ?the second portion of the duodenum. ?EUS Impression: ?- An intramural (subepithelial) lesion was found in the duodenal bulb. The lesion appeared ?to originate from within the deep mucosa (Layer 2). A tissue diagnosis was obtained prior ?to this exam. This is consistent with previously biopsied neuroendocrine tumor. ?- No malignant-appearing lymph nodes were visualized in the gastrohepatic ligament (level ?18), perigastric region, porta hepatis region and upper-abdominal periaortic region. ?- Multiple stones were visualized endosonographically in the gallbladder. ?Last Endoscopy:09/10/21 - Non-obstructing Schatzki ring. Dilated. ?- 2 cm hiatal hernia. ?- Gastric mucosal atrophy. ?- Gastritis. Biopsied. ?- Non-bleeding gastric ulcers with a clean ulcer base (Forrest Class III). ?- Mucosal nodule found in the duodenum. Biopsied. ? ?Recommendations:  ? ? ?Past Medical History:  ?Diagnosis Date  ? Anemia   ? Cataract   ? Chronic kidney disease   ? CVA (cerebral infarction)   ? 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 Dialysis patient ([facility]   ? M, W, F  ? Fistula   ? R arm  ? GERD (gastroesophageal reflux disease)   ? Hypertension   ? Renal disorder   ?  Shortness of breath   ? Stroke [facility])   ? right side weakness  ? ? ?Past Surgical History:  ?Procedure Laterality Date  ? AMPUTATION Right 01/13/2020  ? 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 AV FISTULA PLACEMENT Right 09/08/2013  ? Procedure: CREATION OF RIGHT BRACHIAL CEPHALIC ARTERIOVENOUS FISTULA ;  Surgeon: Mal Misty, MD;  Location: [place];  Service: Vascular;  Laterality: Right;  ? BASCILIC VEIN TRANSPOSITION Right 01/26/2014  ? Procedure: Right Arm BASILIC VEIN TRANSPOSITION;  Surgeon: Mal Misty, MD;  Location: [place];  Service: Vascular;  Laterality: Right;  ? BIOPSY  09/10/2021  ? Procedure: BIOPSY;  Surgeon: Harvel Quale, MD;  [facility];  Service: Gastroenterology;;  gastric  ? BIOPSY  10/07/2021  ? Procedure: BIOPSY;  Surgeon: Irving Copas., MD;  Location[facility];  Service: Gastroenterology;;  ? CATARACT EXTRACTION W/PHACO  11/20/2011  ? Procedure: CATARACT EXTRACTION PHACO AND INTRAOCULAR LENS PLACEMENT (IOC);  Surgeon: Tonny Branch, MD;  [facility];  Service: Ophthalmology;  Laterality: Right;  CDE 18.82  ? CATARACT EXTRACTION W/PHACO Left 11/18/2012  ? Procedure: CATARACT EXTRACTION PHACO AND INTRAOCULAR LENS PLACEMENT (IOC);  Surgeon: Tonny Branch, MD;  [facility];  Service: Ophthalmology;  Laterality: Left;  CDE: 18.97  ? COLONOSCOPY N/A 02/09/2013  ? Procedure: COLONOSCOPY;  Surgeon: Rogene Houston, MD;  [facility];  Service: Endoscopy;  Laterality: N/A;  305-moved to 220 Ann to notify pt  ? COLONOSCOPY N/A 07/08/2018  ? Procedure: COLONOSCOPY;  Surgeon: Rogene Houston, MD;  [facility];  Service: Endoscopy;  Laterality: N/A;  930  ? ENDOSCOPIC MUCOSAL RESECTION N/A 10/07/2021  ? Procedure: ENDOSCOPIC MUCOSAL RESECTION;  Surgeon: Rush Landmark Telford Nab., MD;  Location: [facility];  Service: Gastroenterology;  Laterality: N/A;  ? ESOPHAGOGASTRODUODENOSCOPY (EGD) WITH PROPOFOL N/A 09/10/2021  ? Procedure: ESOPHAGOGASTRODUODENOSCOPY (EGD) WITH PROPOFOL;  Surgeon: Harvel Quale, MD;  [facility];  Service: Gastroenterology;  Laterality: N/A;  ? ESOPHAGOGASTRODUODENOSCOPY (EGD) WITH  PROPOFOL N/A 10/07/2021  ? Procedure: ESOPHAGOGASTRODUODENOSCOPY (EGD) WITH PROPOFOL;  Surgeon: Rush Landmark Telford Nab., MD;  Location: [facility];  Service: Gastroenterology;  Laterality: N/A;  ? EUS N/A 10/07/2021  ? Procedure: UPPER ENDOSCOPIC ULTRASOUND (EUS) RADIAL;  Surgeon: Rush Landmark Telford Nab., MD;  [facility]: [facility];  Service: Gastroenterology;  Laterality: N/A;  ? GASTRIC VARICES BANDING  10/07/2021  ? Procedure: duodenal tumor BANDING;  Surgeon: Mansouraty, Telford Nab., MD;  Location: [facility];  Service: Gastroenterology;;  with emr kit  ? HEMOSTASIS CLIP PLACEMENT  10/07/2021  ? Procedure: HEMOSTASIS CLIP PLACEMENT;  Surgeon: Irving Copas., MD;  Location: [facility];  Service: Gastroenterology;;  ? INSERTION OF DIALYSIS CATHETER Right 06/24/2013  ? Procedure: INSERTION OF DIALYSIS CATHETER: Ultrasound guided;  Surgeon: Serafina Mitchell, MD;  Location: [place];  Service: Vascular;  Laterality: Right;  ? LIGATION OF ARTERIOVENOUS  FISTULA Right 01/26/2014  ? Procedure: LIGATION OF ARTERIOVENOUS  FISTULA;  Surgeon: Mal Misty, MD;  Location: [place];  Service: Vascular;  Laterality: Right;  ? LOOP RECORDER IMPLANT N/A 07/21/2013  ? Procedure: LOOP RECORDER IMPLANT;  Surgeon: Evans Lance, MD;  Location: [facility];  Service: Cardiovascular;  Laterality: N/A;  ? POLYPECTOMY  07/08/2018  ? Procedure: POLYPECTOMY;  Surgeon: Rogene Houston, MD;  [facility];  Service: Endoscopy;;  Descending colon  polyps x 2   ? SUBMUCOSAL LIFTING INJECTION  10/07/2021  ? Procedure: SUBMUCOSAL LIFTING INJECTION;  Surgeon: Irving Copas., MD;  Location[facility];  Service: Gastroenterology;;  ? TEE WITHOUT CARDIOVERSION N/A 07/21/2013  ? Procedure: TRANSESOPHAGEAL ECHOCARDIOGRAM (TEE);  Surgeon: Dorothy Spark, MD;  Location: [place];  Service: Cardiovascular;  Laterality: N/A;  ? ? ?Current Outpatient Medications  ?Medication Sig Dispense Refill  ? albuterol (VENTOLIN HFA) 108  (90 Base) MCG/ACT inhaler Inhale 2 puffs into the lungs every 6 (six) hours as needed for wheezing or shortness of breath. 1 each 0  ? aspirin 325 MG tablet Take 650 mg by mouth daily.    ? BD INSULIN SYR

## 2021-10-15 NOTE — Patient Instructions (Signed)
I am glad you swallowing is doing better! ?Please continue to chew thoroughly and take sips of liquids between bites ?I will consult with Dr. Jenetta Downer regarding further follow up visits here, likely we will see you on an as needed basis since Dr. Donneta Romberg office is managing the neuroendocrine tumor that was found in April. ?

## 2021-10-16 DIAGNOSIS — N186 End stage renal disease: Secondary | ICD-10-CM | POA: Diagnosis not present

## 2021-10-16 DIAGNOSIS — Z992 Dependence on renal dialysis: Secondary | ICD-10-CM | POA: Diagnosis not present

## 2021-10-18 DIAGNOSIS — Z992 Dependence on renal dialysis: Secondary | ICD-10-CM | POA: Diagnosis not present

## 2021-10-18 DIAGNOSIS — N186 End stage renal disease: Secondary | ICD-10-CM | POA: Diagnosis not present

## 2021-10-21 DIAGNOSIS — N186 End stage renal disease: Secondary | ICD-10-CM | POA: Diagnosis not present

## 2021-10-21 DIAGNOSIS — Z992 Dependence on renal dialysis: Secondary | ICD-10-CM | POA: Diagnosis not present

## 2021-10-23 ENCOUNTER — Ambulatory Visit: Payer: Medicare Other | Admitting: "Endocrinology

## 2021-10-23 ENCOUNTER — Ambulatory Visit (INDEPENDENT_AMBULATORY_CARE_PROVIDER_SITE_OTHER): Payer: Medicare Other | Admitting: "Endocrinology

## 2021-10-23 ENCOUNTER — Encounter: Payer: Self-pay | Admitting: "Endocrinology

## 2021-10-23 VITALS — BP 152/80 | HR 72 | Ht 68.0 in | Wt 171.6 lb

## 2021-10-23 DIAGNOSIS — N186 End stage renal disease: Secondary | ICD-10-CM

## 2021-10-23 DIAGNOSIS — E1122 Type 2 diabetes mellitus with diabetic chronic kidney disease: Secondary | ICD-10-CM

## 2021-10-23 DIAGNOSIS — E785 Hyperlipidemia, unspecified: Secondary | ICD-10-CM | POA: Diagnosis not present

## 2021-10-23 DIAGNOSIS — Z992 Dependence on renal dialysis: Secondary | ICD-10-CM

## 2021-10-23 DIAGNOSIS — Z794 Long term (current) use of insulin: Secondary | ICD-10-CM | POA: Diagnosis not present

## 2021-10-23 DIAGNOSIS — I120 Hypertensive chronic kidney disease with stage 5 chronic kidney disease or end stage renal disease: Secondary | ICD-10-CM | POA: Diagnosis not present

## 2021-10-23 DIAGNOSIS — I1311 Hypertensive heart and chronic kidney disease without heart failure, with stage 5 chronic kidney disease, or end stage renal disease: Secondary | ICD-10-CM | POA: Diagnosis not present

## 2021-10-23 DIAGNOSIS — E1169 Type 2 diabetes mellitus with other specified complication: Secondary | ICD-10-CM

## 2021-10-23 NOTE — Progress Notes (Signed)
? ?     10/23/2021, 1:00 PM ? ?Endocrinology follow-up note ? ? ?Subjective:  ? ? Patient ID: DEMESHIA Howard, female    DOB: [birth year].  ?[name] is being seen in follow-up after she was treated consultation for management of currently uncontrolled symptomatic diabetes requested by  [name], MD. ?She is accompanied by her daughter to clinic. ? ?Past Medical History:  ?Diagnosis Date  ? Anemia   ? Cataract   ? Chronic kidney disease   ? CVA (cerebral infarction)   ? 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 Dialysis patient [facility])   ? M, W, F  ? Fistula   ? R arm  ? GERD (gastroesophageal reflux disease)   ? Hypertension   ? Renal disorder   ? Shortness of breath   ? Stroke [facility])   ? right side weakness  ? ? ?Past Surgical History:  ?Procedure Laterality Date  ? AMPUTATION Right 01/13/2020  ? 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 AV FISTULA PLACEMENT Right 09/08/2013  ? Procedure: CREATION OF RIGHT BRACHIAL CEPHALIC ARTERIOVENOUS FISTULA ;  Surgeon: Mal Misty, MD;  Location: [place];  Service: Vascular;  Laterality: Right;  ? BASCILIC VEIN TRANSPOSITION Right 01/26/2014  ? Procedure: Right Arm BASILIC VEIN TRANSPOSITION;  Surgeon: Mal Misty, MD;  Location: [place];  Service: Vascular;  Laterality: Right;  ? BIOPSY  09/10/2021  ? Procedure: BIOPSY;  Surgeon: Harvel Quale, MD;  [facility];  Service: Gastroenterology;;  gastric  ? BIOPSY  10/07/2021  ? Procedure: BIOPSY;  Surgeon: Irving Copas., MD;  [facility];  Service: Gastroenterology;;  ? CATARACT EXTRACTION W/PHACO  11/20/2011  ? Procedure: CATARACT EXTRACTION PHACO AND INTRAOCULAR LENS PLACEMENT (IOC);  Surgeon: Tonny Branch, MD;  [facility];  Service: Ophthalmology;  Laterality: Right;  CDE 18.82  ? CATARACT EXTRACTION W/PHACO Left 11/18/2012  ?  Procedure: CATARACT EXTRACTION PHACO AND INTRAOCULAR LENS PLACEMENT (IOC);  Surgeon: Tonny Branch, MD;  [facility];  Service: Ophthalmology;  Laterality: Left;  CDE: 18.97  ? COLONOSCOPY N/A 02/09/2013  ? Procedure: COLONOSCOPY;  Surgeon: Rogene Houston, MD;  [facility];  Service: Endoscopy;  Laterality: N/A;  305-moved to 220 Ann to notify pt  ? COLONOSCOPY N/A 07/08/2018  ? Procedure: COLONOSCOPY;  Surgeon: Rogene Houston, MD;  [facility];  Service: Endoscopy;  Laterality: N/A;  930  ? ENDOSCOPIC MUCOSAL RESECTION N/A 10/07/2021  ? Procedure: ENDOSCOPIC MUCOSAL RESECTION;  Surgeon: Rush Landmark Telford Nab., MD;  Location[facility];  Service: Gastroenterology;  Laterality: N/A;  ? ESOPHAGOGASTRODUODENOSCOPY (EGD) WITH PROPOFOL N/A 09/10/2021  ? Procedure: ESOPHAGOGASTRODUODENOSCOPY (EGD) WITH PROPOFOL;  Surgeon: Harvel Quale, MD;  [facility];  Service: Gastroenterology;  Laterality: N/A;  ? ESOPHAGOGASTRODUODENOSCOPY (EGD) WITH PROPOFOL N/A 10/07/2021  ? Procedure: ESOPHAGOGASTRODUODENOSCOPY (EGD) WITH PROPOFOL;  Surgeon: Rush Landmark Telford Nab., MD;  Location[facility];  Service: Gastroenterology;  Laterality: N/A;  ? EUS N/A 10/07/2021  ? Procedure: UPPER ENDOSCOPIC ULTRASOUND (EUS) RADIAL;  Surgeon: Rush Landmark Telford Nab., MD;  [facility];  Service: Gastroenterology;  Laterality: N/A;  ? GASTRIC VARICES BANDING  10/07/2021  ? Procedure: duodenal tumor BANDING;  Surgeon: Mansouraty, Telford Nab., MD;  Location: [facility];  Service: Gastroenterology;;  with emr kit  ? HEMOSTASIS CLIP PLACEMENT  10/07/2021  ? Procedure: HEMOSTASIS CLIP PLACEMENT;  Surgeon: Irving Copas., MD;  Location[facility];  Service: Gastroenterology;;  ? INSERTION OF DIALYSIS CATHETER Right 06/24/2013  ? Procedure: INSERTION OF DIALYSIS CATHETER: Ultrasound guided;  Surgeon: Serafina Mitchell, MD;  Location: [place];  Service: Vascular;  Laterality: Right;  ?  LIGATION OF ARTERIOVENOUS  FISTULA Right 01/26/2014  ? Procedure: LIGATION OF ARTERIOVENOUS  FISTULA;  Surgeon: Mal Misty, MD;  Location: [place];  Service: Vascular;  Laterality: Right;  ? LOOP RECORDER IMPLANT N/A 07/21/2013  ? Procedure: LOOP RECORDER IMPLANT;  Surgeon: Evans Lance, MD;  Location: [facility] CATH LAB;  Service: Cardiovascular;  Laterality: N/A;  ? POLYPECTOMY  07/08/2018  ? Procedure: POLYPECTOMY;  Surgeon: Rogene Houston, MD;  [facility];  Service: Endoscopy;;  Descending colon polyps x 2   ? SUBMUCOSAL LIFTING INJECTION  10/07/2021  ? Procedure: SUBMUCOSAL LIFTING INJECTION;  Surgeon: Irving Copas., MD;  Location[facility];  Service: Gastroenterology;;  ? TEE WITHOUT CARDIOVERSION N/A 07/21/2013  ? Procedure: TRANSESOPHAGEAL ECHOCARDIOGRAM (TEE);  Surgeon: Dorothy Spark, MD;  Location: [place];  Service: Cardiovascular;  Laterality: N/A;  ? ? ?Social History  ? ?Socioeconomic History  ? Marital status: Widowed  ?  Spouse name: Not on file  ? Number of children: Not on file  ? Years of education: Not on file  ? Highest education level: Not on file  ?Occupational History  ? Not on file  ?Tobacco Use  ? Smoking status: Never  ? Smokeless tobacco: Never  ?Vaping Use  ? Vaping Use: Never used  ?Substance and Sexual Activity  ? Alcohol use: No  ? Drug use: No  ? Sexual activity: Not on file  ?Other Topics Concern  ? Not on file  ?Social History Narrative  ? ** Merged History Encounter **  ?    ? ?Social Determinants of Health  ? ?Financial Resource Strain: Not on file  ?Food Insecurity: Not on file  ?Transportation Needs: Not on file  ?Physical Activity: Not on file  ?Stress: Not on file  ?Social Connections: Not on file  ? ? ?Family History  ?Problem Relation Age of Onset  ? Cancer Sister   ? Cancer Brother   ? Anesthesia problems Neg Hx   ? Hypotension Neg Hx   ? Malignant hyperthermia Neg Hx   ? Pseudochol deficiency Neg Hx   ? ? ?Outpatient Encounter Medications as of  10/23/2021  ?Medication Sig  ? albuterol (VENTOLIN HFA) 108 (90 Base) MCG/ACT inhaler Inhale 2 puffs into the lungs every 6 (six) hours as needed for wheezing or shortness of breath.  ? aspirin 325 MG tablet Take 650 mg by mouth daily.  ? BD INSULIN SYRINGE U/F 31G X 5/16" 0.3 ML MISC Inject 1 each into the skin 2 (two) times daily. as directed  ? cyanocobalamin (,VITAMIN B-12,) 1000 MCG/ML injection Inject 1,000 mcg into the muscle every 30 (thirty) days.  ? diclofenac Sodium (VOLTAREN) 1 % GEL Apply 4 g topically 4 (four) times daily as needed. Apply to bilateral knees  ? HUMULIN 70/30 (70-30) 100 UNIT/ML injection Inject 5-8 Units into the skin 2 (two) times daily. 8 units with breakfast, 5 units with supper only if glucose is above 90   ? labetalol (NORMODYNE) 200 MG tablet Take 200 mg by mouth 2 (two) times daily.  ? lidocaine-prilocaine (EMLA) cream Apply 1 application topically every Monday, Wednesday, and Friday.  ? multivitamin (RENA-VIT) TABS  tablet Take 1 tablet by mouth at bedtime.  ? NON FORMULARY Diet: NAS, Cons CHO  ? omeprazole (PRILOSEC) 40 MG capsule Take 1 capsule (40 mg total) by mouth 2 (two) times daily before a meal.  ? predniSONE (DELTASONE) 50 MG tablet Take one tablet by mouth once daily for 5 days.  ? sevelamer carbonate (RENVELA) 800 MG tablet Take 1 tablet (800 mg total) by mouth 2 (two) times daily before lunch and supper.  ? sucralfate (CARAFATE) 1 GM/10ML suspension Take 10 mLs (1 g total) by mouth 2 (two) times daily.  ? traMADol (ULTRAM) 50 MG tablet Take 1 tablet (50 mg total) by mouth every 8 (eight) hours as needed for moderate pain or severe pain.  ? ?No facility-administered encounter medications on file as of 10/23/2021.  ? ? ?ALLERGIES: ?Allergies  ?Allergen Reactions  ? Ambien [Zolpidem] Other (See Comments)  ?  Hallucinations   ? Reglan [Metoclopramide] Other (See Comments)  ?  "makes me crazy"  ? ? ?VACCINATION STATUS: ?Immunization History  ?Administered Date(s)  Administered  ? Influenza Whole 03/18/2006  ? Influenza-Unspecified 03/13/2020  ? Moderna SARS-COV2 Booster Vaccination 06/20/2020  ? Moderna Sars-Covid-2 Vaccination 08/31/2019, 09/28/2019  ? Pneumococcal-Unspecified

## 2021-10-23 NOTE — Patient Instructions (Signed)

## 2021-10-25 DIAGNOSIS — N186 End stage renal disease: Secondary | ICD-10-CM | POA: Diagnosis not present

## 2021-10-25 DIAGNOSIS — Z992 Dependence on renal dialysis: Secondary | ICD-10-CM | POA: Diagnosis not present

## 2021-10-26 ENCOUNTER — Other Ambulatory Visit: Payer: Self-pay | Admitting: "Endocrinology

## 2021-10-28 DIAGNOSIS — N186 End stage renal disease: Secondary | ICD-10-CM | POA: Diagnosis not present

## 2021-10-28 DIAGNOSIS — Z992 Dependence on renal dialysis: Secondary | ICD-10-CM | POA: Diagnosis not present

## 2021-10-30 DIAGNOSIS — N186 End stage renal disease: Secondary | ICD-10-CM | POA: Diagnosis not present

## 2021-10-30 DIAGNOSIS — Z992 Dependence on renal dialysis: Secondary | ICD-10-CM | POA: Diagnosis not present

## 2021-11-01 DIAGNOSIS — N186 End stage renal disease: Secondary | ICD-10-CM | POA: Diagnosis not present

## 2021-11-01 DIAGNOSIS — Z992 Dependence on renal dialysis: Secondary | ICD-10-CM | POA: Diagnosis not present

## 2021-11-04 DIAGNOSIS — N186 End stage renal disease: Secondary | ICD-10-CM | POA: Diagnosis not present

## 2021-11-04 DIAGNOSIS — Z992 Dependence on renal dialysis: Secondary | ICD-10-CM | POA: Diagnosis not present

## 2021-11-06 DIAGNOSIS — N186 End stage renal disease: Secondary | ICD-10-CM | POA: Diagnosis not present

## 2021-11-06 DIAGNOSIS — Z992 Dependence on renal dialysis: Secondary | ICD-10-CM | POA: Diagnosis not present

## 2021-11-07 DIAGNOSIS — D510 Vitamin B12 deficiency anemia due to intrinsic factor deficiency: Secondary | ICD-10-CM | POA: Diagnosis not present

## 2021-11-08 DIAGNOSIS — N186 End stage renal disease: Secondary | ICD-10-CM | POA: Diagnosis not present

## 2021-11-08 DIAGNOSIS — Z992 Dependence on renal dialysis: Secondary | ICD-10-CM | POA: Diagnosis not present

## 2021-11-11 DIAGNOSIS — N186 End stage renal disease: Secondary | ICD-10-CM | POA: Diagnosis not present

## 2021-11-11 DIAGNOSIS — Z992 Dependence on renal dialysis: Secondary | ICD-10-CM | POA: Diagnosis not present

## 2021-11-13 DIAGNOSIS — E1122 Type 2 diabetes mellitus with diabetic chronic kidney disease: Secondary | ICD-10-CM | POA: Diagnosis not present

## 2021-11-13 DIAGNOSIS — N186 End stage renal disease: Secondary | ICD-10-CM | POA: Diagnosis not present

## 2021-11-13 DIAGNOSIS — E1169 Type 2 diabetes mellitus with other specified complication: Secondary | ICD-10-CM | POA: Diagnosis not present

## 2021-11-13 DIAGNOSIS — Z992 Dependence on renal dialysis: Secondary | ICD-10-CM | POA: Diagnosis not present

## 2021-11-13 DIAGNOSIS — I120 Hypertensive chronic kidney disease with stage 5 chronic kidney disease or end stage renal disease: Secondary | ICD-10-CM | POA: Diagnosis not present

## 2021-11-14 ENCOUNTER — Telehealth: Payer: Self-pay | Admitting: Family

## 2021-11-14 LAB — TSH: TSH: 2.24 u[IU]/mL (ref 0.450–4.500)

## 2021-11-14 LAB — LIPID PANEL
Chol/HDL Ratio: 2.6 ratio (ref 0.0–4.4)
Cholesterol, Total: 135 mg/dL (ref 100–199)
HDL: 52 mg/dL (ref >39)
LDL Chol Calc (NIH): 69 mg/dL (ref 0–99)
Triglycerides: 71 mg/dL (ref 0–149)
VLDL Cholesterol Cal: 14 mg/dL (ref 5–40)

## 2021-11-14 LAB — T4, FREE: Free T4: 1.22 ng/dL (ref 0.82–1.77)

## 2021-11-14 NOTE — Telephone Encounter (Signed)
Pt last in office for nail trim. Seen in January for LBP please see message below and advise.

## 2021-11-14 NOTE — Telephone Encounter (Signed)
[name] is calling to request a prescription refill for Tramadol for her mother [name]. Needs to be sent to [facility]. Please advise

## 2021-11-15 DIAGNOSIS — N186 End stage renal disease: Secondary | ICD-10-CM | POA: Diagnosis not present

## 2021-11-15 DIAGNOSIS — Z992 Dependence on renal dialysis: Secondary | ICD-10-CM | POA: Diagnosis not present

## 2021-11-15 MED ORDER — TRAMADOL HCL 50 MG PO TABS: 50.0000 mg | ORAL_TABLET | Freq: Three times a day (TID) | ORAL | 0 refills | Status: DC | PRN

## 2021-11-18 DIAGNOSIS — Z992 Dependence on renal dialysis: Secondary | ICD-10-CM | POA: Diagnosis not present

## 2021-11-18 DIAGNOSIS — N186 End stage renal disease: Secondary | ICD-10-CM | POA: Diagnosis not present

## 2021-11-20 ENCOUNTER — Emergency Department (HOSPITAL_COMMUNITY)
Admission: EM | Admit: 2021-11-20 | Discharge: 2021-11-20 | Disposition: A | Discharge status: Home / Self Care | Payer: Medicare Other | Attending: Emergency Medicine | Admitting: Emergency Medicine

## 2021-11-20 ENCOUNTER — Other Ambulatory Visit: Payer: Self-pay

## 2021-11-20 ENCOUNTER — Encounter (HOSPITAL_COMMUNITY): Payer: Self-pay | Admitting: Emergency Medicine

## 2021-11-20 ENCOUNTER — Emergency Department (HOSPITAL_COMMUNITY): Payer: Medicare Other

## 2021-11-20 DIAGNOSIS — Z043 Encounter for examination and observation following other accident: Secondary | ICD-10-CM | POA: Diagnosis not present

## 2021-11-20 DIAGNOSIS — Y92009 Unspecified place in unspecified non-institutional (private) residence as the place of occurrence of the external cause: Secondary | ICD-10-CM | POA: Insufficient documentation

## 2021-11-20 DIAGNOSIS — Z794 Long term (current) use of insulin: Secondary | ICD-10-CM | POA: Diagnosis not present

## 2021-11-20 DIAGNOSIS — I120 Hypertensive chronic kidney disease with stage 5 chronic kidney disease or end stage renal disease: Secondary | ICD-10-CM | POA: Insufficient documentation

## 2021-11-20 DIAGNOSIS — S40012A Contusion of left shoulder, initial encounter: Secondary | ICD-10-CM | POA: Diagnosis not present

## 2021-11-20 DIAGNOSIS — I10 Essential (primary) hypertension: Secondary | ICD-10-CM

## 2021-11-20 DIAGNOSIS — S0003XA Contusion of scalp, initial encounter: Secondary | ICD-10-CM | POA: Diagnosis not present

## 2021-11-20 DIAGNOSIS — W01198A Fall on same level from slipping, tripping and stumbling with subsequent striking against other object, initial encounter: Secondary | ICD-10-CM | POA: Diagnosis not present

## 2021-11-20 DIAGNOSIS — Z992 Dependence on renal dialysis: Secondary | ICD-10-CM | POA: Insufficient documentation

## 2021-11-20 DIAGNOSIS — Z7982 Long term (current) use of aspirin: Secondary | ICD-10-CM | POA: Insufficient documentation

## 2021-11-20 DIAGNOSIS — E1122 Type 2 diabetes mellitus with diabetic chronic kidney disease: Secondary | ICD-10-CM | POA: Diagnosis not present

## 2021-11-20 DIAGNOSIS — N186 End stage renal disease: Secondary | ICD-10-CM | POA: Insufficient documentation

## 2021-11-20 DIAGNOSIS — S0083XA Contusion of other part of head, initial encounter: Secondary | ICD-10-CM | POA: Insufficient documentation

## 2021-11-20 DIAGNOSIS — S0990XA Unspecified injury of head, initial encounter: Secondary | ICD-10-CM | POA: Diagnosis present

## 2021-11-20 NOTE — ED Provider Notes (Signed)
[facility] EMERGENCY DEPARTMENT Provider Note   CSN: [id number] Arrival date & time: 11/20/21  0457     History  Chief Complaint  Patient presents with   Lytle Michaels    [name] is a 81 y.o. female.  The history is provided by the patient.  Fall She has history of hypertension, diabetes, hyperlipidemia, end-stage renal disease on hemodialysis, stroke and comes in following a fall at home.  She tripped as she was getting ready get dressed to go to dialysis.  She suffered a bruise to her forehead on the left side, and she is complaining of pain in her left shoulder when she raises it up.  She denies neck, back, chest, abdomen injury and denies any injury to her legs.  She is not on any anticoagulants.   Home Medications Prior to Admission medications   Medication Sig Start Date End Date Taking? Authorizing Provider  albuterol (VENTOLIN HFA) 108 (90 Base) MCG/ACT inhaler Inhale 2 puffs into the lungs every 6 (six) hours as needed for wheezing or shortness of breath. 11/21/20   Gerlene Fee, NP  aspirin 325 MG tablet Take 650 mg by mouth daily. 07/09/18   Rogene Houston, MD  BD INSULIN SYRINGE U/F 31G X 5/16" 0.3 ML MISC Inject 1 each into the skin 2 (two) times daily. as directed 11/21/20   Gerlene Fee, NP  cyanocobalamin (,VITAMIN B-12,) 1000 MCG/ML injection Inject 1,000 mcg into the muscle every 30 (thirty) days. 11/01/14   [provider]  diclofenac Sodium (VOLTAREN) 1 % GEL Apply 4 g topically 4 (four) times daily as needed. Apply to bilateral knees 03/16/21   Scot Jun, FNP  insulin NPH-regular Human (HUMULIN 70/30) (70-30) 100 UNIT/ML injection Inject 6-8 Units into the skin 2 (two) times daily with a meal. 10/28/21   Brita Romp, NP  labetalol (NORMODYNE) 200 MG tablet Take 200 mg by mouth 2 (two) times daily. 07/26/21   [provider]  lidocaine-prilocaine (EMLA) cream Apply 1 application topically every Monday, Wednesday, and Friday.  11/21/20   Gerlene Fee, NP  multivitamin (RENA-VIT) TABS tablet Take 1 tablet by mouth at bedtime. 08/21/14   Kirsteins, Luanna Salk, MD  NON FORMULARY Diet: NAS, Cons CHO    [provider]  omeprazole (PRILOSEC) 40 MG capsule Take 1 capsule (40 mg total) by mouth 2 (two) times daily before a meal. 10/07/21   Mansouraty, Telford Nab., MD  predniSONE (DELTASONE) 50 MG tablet Take one tablet by mouth once daily for 5 days. 07/02/21   Suzan Slick, NP  sevelamer carbonate (RENVELA) 800 MG tablet Take 1 tablet (800 mg total) by mouth 2 (two) times daily before lunch and supper. 11/21/20   Gerlene Fee, NP  sucralfate (CARAFATE) 1 GM/10ML suspension Take 10 mLs (1 g total) by mouth 2 (two) times daily. 10/07/21 10/07/22  Mansouraty, Telford Nab., MD  traMADol (ULTRAM) 50 MG tablet Take 1 tablet (50 mg total) by mouth every 8 (eight) hours as needed for moderate pain or severe pain. 11/15/21   Suzan Slick, NP      Allergies    Ambien [zolpidem] and Reglan [metoclopramide]    Review of Systems   Review of Systems  All other systems reviewed and are negative.  Physical Exam Updated Vital Signs BP (!) 180/69   Pulse 71   Temp 98 F (36.7 C)   Resp 18   Ht '5\' 8"'$  (1.727 m)   Wt 77.8  kg   SpO2 97%   BMI 26.08 kg/m  Physical Exam Vitals and nursing note reviewed.  81 year old female, resting comfortably and in no acute distress. Vital signs are are significant for elevated blood pressure. Oxygen saturation is 97%, which is normal. Head is normocephalic.  Ecchymosis and hematoma present on the left side of the forehead with diameter of about 3 cm. PERRLA, EOMI.  Neck is nontender without adenopathy or JVD. Back is nontender and there is no CVA tenderness. Lungs are clear without rales, wheezes, or rhonchi. Chest is nontender. Heart has regular rate and rhythm without murmur. Abdomen is soft, flat, nontender without masses or hepatosplenomegaly and peristalsis is  normoactive. Extremities: No swelling or deformity present of the left shoulder.  There is no tenderness to palpation.  Passive range of motion of the left shoulder is intact with no pain elicited.  AV fistula is present on the right upper arm with thrill present. Skin is warm and dry without rash. Neurologic: Mental status is normal, cranial nerves are intact, moves all extremities equally.  ED Results / Procedures / Treatments    Radiology CT Head Wo Contrast  Result Date: 11/20/2021 CLINICAL DATA:  Status post fall. EXAM: CT HEAD WITHOUT CONTRAST CT CERVICAL SPINE WITHOUT CONTRAST TECHNIQUE: Multidetector CT imaging of the head and cervical spine was performed following the standard protocol without intravenous contrast. Multiplanar CT image reconstructions of the cervical spine were also generated. RADIATION DOSE REDUCTION: This exam was performed according to the departmental dose-optimization program which includes automated exposure control, adjustment of the mA and/or kV according to patient size and/or use of iterative reconstruction technique. COMPARISON:  CT head 11/03/2020. FINDINGS: CT HEAD FINDINGS Brain: No evidence of acute infarction, hemorrhage, hydrocephalus, extra-axial collection or mass lesion/mass effect. There is diffuse low-attenuation within the subcortical and periventricular white matter compatible with chronic microvascular disease. Prominence of the sulci and ventricles compatible with brain atrophy. Vascular: No hyperdense vessel or unexpected calcification. Skull: Normal. Negative for fracture or focal lesion. Sinuses/Orbits: No acute finding. Other: Left frontal scalp hematoma measures 4.2 x 0.8 cm, image 15/2. CT CERVICAL SPINE FINDINGS Alignment: The alignment of the cervical spine appears normal. Skull base and vertebrae: The vertebral body heights are well preserved. No signs acute fracture or dislocation. Soft tissues and spinal canal: No prevertebral fluid or swelling.  No visible canal hematoma. Disc levels: Multilevel disc space narrowing and endplate spurring is noted at C4-5, C5-6 and C6-7. Upper chest: Negative. Other: None IMPRESSION: 1. No evidence for acute intracranial abnormality. 2. Left frontal scalp hematoma without evidence for underlying skull fracture. 3. Chronic microvascular disease and brain atrophy. 4. No evidence for cervical spine fracture or dislocation. 5. 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 Electronically Signed   By: Kerby Moors M.D.   On: 11/20/2021 06:52   CT Cervical Spine Wo Contrast  Result Date: 11/20/2021 CLINICAL DATA:  Status post fall. EXAM: CT HEAD WITHOUT CONTRAST CT CERVICAL SPINE WITHOUT CONTRAST TECHNIQUE: Multidetector CT imaging of the head and cervical spine was performed following the standard protocol without intravenous contrast. Multiplanar CT image reconstructions of the cervical spine were also generated. RADIATION DOSE REDUCTION: This exam was performed according to the departmental dose-optimization program which includes automated exposure control, adjustment of the mA and/or kV according to patient size and/or use of iterative reconstruction technique. COMPARISON:  CT head 11/03/2020. FINDINGS: CT HEAD FINDINGS Brain: No evidence of acute infarction, hemorrhage, hydrocephalus, extra-axial collection or mass lesion/mass  effect. There is diffuse low-attenuation within the subcortical and periventricular white matter compatible with chronic microvascular disease. Prominence of the sulci and ventricles compatible with brain atrophy. Vascular: No hyperdense vessel or unexpected calcification. Skull: Normal. Negative for fracture or focal lesion. Sinuses/Orbits: No acute finding. Other: Left frontal scalp hematoma measures 4.2 x 0.8 cm, image 15/2. CT CERVICAL SPINE FINDINGS Alignment: The alignment of the cervical spine appears normal. Skull base and vertebrae: The vertebral body heights are well  preserved. No signs acute fracture or dislocation. Soft tissues and spinal canal: No prevertebral fluid or swelling. No visible canal hematoma. Disc levels: Multilevel disc space narrowing and endplate spurring is noted at C4-5, C5-6 and C6-7. Upper chest: Negative. Other: None IMPRESSION: 1. No evidence for acute intracranial abnormality. 2. Left frontal scalp hematoma without evidence for underlying skull fracture. 3. Chronic microvascular disease and brain atrophy. 4. No evidence for cervical spine fracture or dislocation. 5. 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 Electronically Signed   By: Kerby Moors M.D.   On: 11/20/2021 06:52   DG Shoulder Left  Result Date: 11/20/2021 CLINICAL DATA: Status post fall. EXAM: LEFT SHOULDER - 2+ VIEW COMPARISON:  None Available. FINDINGS: No signs of acute fracture or dislocation. Calcification along the rotator cuff compatible with chronic rotator cuff tendinopathy. Soft tissues are otherwise unremarkable. IMPRESSION: 1. No acute findings. 2. Suspect chronic calcific tendinopathy of the rotator cuff. Electronically Signed   By: Kerby Moors M.D.   On: 11/20/2021 07:06    Procedures Procedures  Cardiac monitor shows normal sinus rhythm, per my interpretation.  Medications Ordered in ED Medications - No data to display  ED Course/ Medical Decision Making/ A&P                           Medical Decision Making Amount and/or Complexity of Data Reviewed Radiology: ordered.   Fall at home with injury to the forehead and left shoulder.  She will be sent for CT of head to rule out skull fracture, intracranial injury.  I have also ordered CT of the cervical spine to rule out fracture.  X-ray of the left shoulder has been ordered to look for evidence of fracture.  Old records are reviewed, and she has no prior ED visits for falls.  X-rays of the shoulder show no evidence of fracture.  CT of head does show left frontal contusion but no  intracranial injury.  CT of cervical spine shows degenerative changes but no acute process.  I have independently viewed the images, and agree with radiologist's interpretation.  She is discharged home, instructed to go for her dialysis later today.  Final Clinical Impression(s) / ED Diagnoses Final diagnoses:  Fall at home, initial encounter  Contusion of forehead, initial encounter  Contusion of left shoulder, initial encounter  Elevated blood pressure reading with diagnosis of hypertension  End-stage renal disease on hemodialysis [facility])    Rx / DC Orders ED Discharge Orders     None         Delora Fuel, MD 15/40/08 [phone]

## 2021-11-20 NOTE — ED Triage Notes (Signed)
Pt states she tripped and fell at home striking head and c/o left shoulder pain. Pt not taking any blood thinners.

## 2021-11-20 NOTE — Discharge Instructions (Signed)
Apply ice for 30 minutes at a time, 4 times a day.  Take acetaminophen as needed for pain.  Make sure to get your dialysis center today, as scheduled.

## 2021-11-21 ENCOUNTER — Ambulatory Visit (HOSPITAL_COMMUNITY)
Admission: RE | Admit: 2021-11-21 | Discharge: 2021-11-21 | Disposition: A | Discharge status: Home / Self Care | Payer: Medicare Other | Source: Ambulatory Visit | Attending: Gastroenterology | Admitting: Gastroenterology

## 2021-11-21 DIAGNOSIS — D3A8 Other benign neuroendocrine tumors: Secondary | ICD-10-CM | POA: Diagnosis not present

## 2021-11-21 DIAGNOSIS — N200 Calculus of kidney: Secondary | ICD-10-CM | POA: Diagnosis not present

## 2021-11-21 LAB — POCT I-STAT CREATININE: Creatinine, Ser: 5.8 mg/dL — ABNORMAL HIGH (ref 0.44–1.00)

## 2021-11-21 MED ORDER — IOHEXOL 300 MG/ML  SOLN
100.0000 mL | Freq: Once | INTRAMUSCULAR | Status: AC | PRN
  Administered 2021-11-21: 100 mL via INTRAVENOUS

## 2021-11-22 DIAGNOSIS — E1122 Type 2 diabetes mellitus with diabetic chronic kidney disease: Secondary | ICD-10-CM | POA: Diagnosis not present

## 2021-11-22 DIAGNOSIS — N186 End stage renal disease: Secondary | ICD-10-CM | POA: Diagnosis not present

## 2021-11-22 DIAGNOSIS — Z992 Dependence on renal dialysis: Secondary | ICD-10-CM | POA: Diagnosis not present

## 2021-11-22 DIAGNOSIS — Z794 Long term (current) use of insulin: Secondary | ICD-10-CM | POA: Diagnosis not present

## 2021-11-25 DIAGNOSIS — Z992 Dependence on renal dialysis: Secondary | ICD-10-CM | POA: Diagnosis not present

## 2021-11-25 DIAGNOSIS — N186 End stage renal disease: Secondary | ICD-10-CM | POA: Diagnosis not present

## 2021-11-26 ENCOUNTER — Telehealth: Payer: Self-pay | Admitting: Gastroenterology

## 2021-11-26 NOTE — Telephone Encounter (Signed)
Inbound call from patients daughter stating she was returning the call from yesterday. Please advise.

## 2021-11-26 NOTE — Telephone Encounter (Signed)
See alternate note 6/14

## 2021-11-27 DIAGNOSIS — N186 End stage renal disease: Secondary | ICD-10-CM | POA: Diagnosis not present

## 2021-11-27 DIAGNOSIS — Z992 Dependence on renal dialysis: Secondary | ICD-10-CM | POA: Diagnosis not present

## 2021-11-29 DIAGNOSIS — Z992 Dependence on renal dialysis: Secondary | ICD-10-CM | POA: Diagnosis not present

## 2021-11-29 DIAGNOSIS — N186 End stage renal disease: Secondary | ICD-10-CM | POA: Diagnosis not present

## 2021-12-02 DIAGNOSIS — N186 End stage renal disease: Secondary | ICD-10-CM | POA: Diagnosis not present

## 2021-12-02 DIAGNOSIS — Z992 Dependence on renal dialysis: Secondary | ICD-10-CM | POA: Diagnosis not present

## 2021-12-04 DIAGNOSIS — N186 End stage renal disease: Secondary | ICD-10-CM | POA: Diagnosis not present

## 2021-12-04 DIAGNOSIS — Z992 Dependence on renal dialysis: Secondary | ICD-10-CM | POA: Diagnosis not present

## 2021-12-06 DIAGNOSIS — N186 End stage renal disease: Secondary | ICD-10-CM | POA: Diagnosis not present

## 2021-12-06 DIAGNOSIS — Z992 Dependence on renal dialysis: Secondary | ICD-10-CM | POA: Diagnosis not present

## 2021-12-09 DIAGNOSIS — Z992 Dependence on renal dialysis: Secondary | ICD-10-CM | POA: Diagnosis not present

## 2021-12-09 DIAGNOSIS — N186 End stage renal disease: Secondary | ICD-10-CM | POA: Diagnosis not present

## 2021-12-10 ENCOUNTER — Ambulatory Visit (INDEPENDENT_AMBULATORY_CARE_PROVIDER_SITE_OTHER): Payer: Medicare Other

## 2021-12-10 ENCOUNTER — Ambulatory Visit (INDEPENDENT_AMBULATORY_CARE_PROVIDER_SITE_OTHER): Payer: Medicare Other | Admitting: Family

## 2021-12-10 ENCOUNTER — Other Ambulatory Visit: Payer: Medicare Other

## 2021-12-10 DIAGNOSIS — M79671 Pain in right foot: Secondary | ICD-10-CM | POA: Diagnosis not present

## 2021-12-10 DIAGNOSIS — D3A8 Other benign neuroendocrine tumors: Secondary | ICD-10-CM | POA: Diagnosis not present

## 2021-12-10 DIAGNOSIS — M792 Neuralgia and neuritis, unspecified: Secondary | ICD-10-CM | POA: Diagnosis not present

## 2021-12-10 DIAGNOSIS — M7741 Metatarsalgia, right foot: Secondary | ICD-10-CM

## 2021-12-10 DIAGNOSIS — M5416 Radiculopathy, lumbar region: Secondary | ICD-10-CM

## 2021-12-10 MED ORDER — PREGABALIN 75 MG PO CAPS: 75.0000 mg | ORAL_CAPSULE | Freq: Every day | ORAL | 1 refills | Status: DC

## 2021-12-11 DIAGNOSIS — Z992 Dependence on renal dialysis: Secondary | ICD-10-CM | POA: Diagnosis not present

## 2021-12-11 DIAGNOSIS — N186 End stage renal disease: Secondary | ICD-10-CM | POA: Diagnosis not present

## 2021-12-11 LAB — CHROMOGRANIN A: Chromogranin A (ng/mL): 594.6 ng/mL — ABNORMAL HIGH (ref 0.0–101.8)

## 2021-12-11 NOTE — Progress Notes (Signed)
Office Visit Note   Patient: [name]           Date of Birth: 10/10/1940           MRN: [id number] Visit Date: 12/10/2021              Requested by: [name], MD [address] [place],  [place] [address] PCP: [name], MD  Chief Complaint  Patient presents with   Right Foot - Pain   Left Foot - Pain      HPI: The patient is an 81 year old woman who presents today for 2 separate issues.  The patient is complaining of pain in her right foot she is having pain beneath the ball of her foot with ambulation she has had first ray amputation on the same foot since surgery 1 year ago she has had increasing pain difficulty bearing weight pain underneath the ball of her foot in the area of the metatarsal heads especially under the fourth metatarsal head she is concerned about some clawing of the second toe this is fixed.  She has not had any issues with ulcers no redness no fever no chills no swelling  She also has pain that radiates over the dorsum of her foot and down her entire anterior thigh and leg this is a chronic issue she has had injections with Dr. Ernestina Patches twice for lumbar radiculopathy unfortunately these were not helpful for her gave her about 3 days of relief she has significant night pain difficulty lying flat difficulty with sleep due to her radicular symptoms of the right lower extremity  Assessment & Plan: Visit Diagnoses:  1. Pain in right foot     Plan: We will trial Lyrica she has used gabapentin in the past for her neuropathic pain of the right lower extremity without relief.  We will refer to Dr. Lorin Mercy for evaluation of her lumbar spine.  Discussed shoes with a wide toe box for the claw toe as well as a stiff walking sneaker.  Reassurance provided.  Discussed possible surgical intervention of the second toe for the clawing at this time the patient would like to proceed with conservative measures  Discussed radiographic findings.  At this time the  patient does not have any sign of infection or wound to her foot.  We will continue with daily monitoring. Follow-Up Instructions: No follow-ups on file.   Ortho Exam  Patient is alert, oriented, no adenopathy, well-dressed, normal affect, normal respiratory effort. On examination of the right foot the patient does have a palpable dorsalis pedis pulse.  There is no edema the first ray is surgically absent the incision is well-healed there is no erythema no callus no impending skin breakdown the second toe is clawed.  Imaging: No results found. No images are attached to the encounter.  Labs: Lab Results  Component Value Date   HGBA1C 8.7 (A) 07/25/2021   HGBA1C 8.5 03/25/2021   HGBA1C 9.4 (H) 10/31/2020   ESRSEDRATE 42 (H) 01/06/2020   CRP 20.3 (H) 01/06/2020   REPTSTATUS 11/07/2020 FINAL 11/02/2020   CULT  11/02/2020    NO GROWTH 5 DAYS Performed at [facility], [place] [address], [place], [place] [address]      Lab Results  Component Value Date   ALBUMIN 2.9 (L) 11/08/2020   ALBUMIN 3.1 (L) 11/07/2020   ALBUMIN 3.0 (L) 11/06/2020    Lab Results  Component Value Date   MG 1.9 06/26/2013   MG 1.7  01/25/2009   No results found for: "VD25OH"  No results found for: "PREALBUMIN"    Latest Ref Rng & Units 10/07/2021   12:40 PM 10/07/2021   12:30 PM 09/10/2021   12:11 PM  CBC EXTENDED  WBC 4.0 - 10.5 K/uL   7.8   RBC 3.87 - 5.11 MIL/uL   3.30   Hemoglobin 12.0 - 15.0 g/dL 11.9  13.3  9.9   HCT 36.0 - 46.0 % 35.0  39.0  31.7   Platelets 150 - 400 K/uL   91   NEUT# 1.7 - 7.7 K/uL   5.6   Lymph# 0.7 - 4.0 K/uL   1.1      There is no height or weight on file to calculate BMI.  Orders:  Orders Placed This Encounter  Procedures   XR Foot Complete Right   Meds ordered this encounter  Medications   pregabalin (LYRICA) 75 MG capsule    Sig: Take 1 capsule (75 mg total) by mouth daily.    Dispense:  30 capsule    Refill:  1     Procedures: No procedures  performed  Clinical Data: No additional findings.  ROS:  All other systems negative, except as noted in the HPI. Review of Systems  Objective: Vital Signs: There were no vitals taken for this visit.  Specialty Comments:  MRI LUMBAR SPINE WITHOUT CONTRAST   TECHNIQUE: Multiplanar, multisequence MR imaging of the lumbar spine was performed. No intravenous contrast was administered.   COMPARISON:  CT 03/23/2020   FINDINGS: Segmentation:  Standard.   Alignment: Grade 1 anterolisthesis of L4 on L5. Slight lumbar dextrocurvature.   Vertebrae: No fracture, evidence of discitis, or bone lesion. Superior endplate Schmorl's node of L1.   Conus medullaris and cauda equina: Conus extends to the T12-L1 level. Conus appears normal. Slight bunching of the cauda equina nerve roots above the L4-5 level of stenosis.   Paraspinal and other soft tissues: Colonic diverticulosis. Cortically based T2 hyperintense lesionswithin the bilateral kidneys, incompletely characterized, but most likely represent cysts.   Disc levels:   T12-L1: Minimal disc bulge. Unremarkable facet joints. No foraminal or canal stenosis.   L1-L2: Unremarkable disc. Minimal facet arthropathy. No foraminal or canal stenosis.   L2-L3: Mild annular disc bulge. Mild bilateral facet arthropathy. No canal stenosis. Borderline-mild right foraminal stenosis.   L3-L4: No disc protrusion. Mild bilateral facet arthropathy. No canal stenosis. Borderline-mild bilateral foraminal stenosis.   L4-L5: Anterolisthesis with disc uncovering and diffuse disc bulge. Severe bilateral facet arthropathy and ligamentum flavum buckling. Findings result in severe canal stenosis with severe left and moderate right foraminal stenosis.   L5-S1: Diffuse disc bulge, eccentric to the right with endplate osteophytic ridging. Mild bilateral facet arthropathy. Moderate right and mild left foraminal stenosis. No canal stenosis.    IMPRESSION: 1. Multilevel lumbar spondylosis as described above, most pronounced at L4-5 where there is severe canal stenosis with severe left and moderate right foraminal stenosis. 2. Moderate right and mild left foraminal stenosis at L5-S1.     Electronically Signed   By: Davina Poke D.O.   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   Hypertensive heart and CKD, ESRD on dialysis ([place]) 11/09/2020   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 due to type 2 diabetes mellitus ([place]) 11/09/2020   Diabetic peripheral neuropathy associated with  type 2 diabetes mellitus ([place]) 11/09/2020   Dependence on renal dialysis ([place]) 11/09/2020   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 disease ([place]) 11/09/2020   Hyperlipidemia associated with type 2 diabetes mellitus ([place]) 11/09/2020   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 ([place]) 11/09/2020   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 ([place]) 11/09/2020   Chronic constipation 11/09/2020   Vitamin B 12 deficiency 11/09/2020   Acute respiratory failure with hypoxia ([place]) 10/26/2020   Elevated troponin 10/26/2020   Volume overload 10/26/2020   Hypertensive urgency 10/26/2020   History of CVA (cerebrovascular accident) 01/05/2020   Thrombocytopenia ([place]) 01/05/2020   History of colonic polyps 04/01/2018   Acute bronchitis 10/15/2017   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 ([place])    HCAP (healthcare-associated pneumonia) 05/29/2015   Hyperkalemia 05/29/2015   ESRD (end stage renal disease) on dialysis [facility])    Encounter for adequacy testing for hemodialysis ([place]) 01/17/2014   Mechanical complication of other vascular device, implant, and graft 01/17/2014   Disturbances of vision, late effect of stroke 11/03/2013   Right  hemiparesis ([place]) 08/19/2013   CVA (cerebral infarction) 07/20/2013   Gastroparesis due to DM ([place]) 07/01/2013   HLD (hyperlipidemia) 05/20/2006   Hereditary and idiopathic peripheral neuropathy 05/20/2006   Hypertension associated with diabetes ([place]) 05/20/2006   Past Medical History:  Diagnosis Date   Anemia    Cataract    Chronic kidney disease    CVA (cerebral infarction)    Diabetes mellitus with ESRD (end-stage renal disease) ([place])    Type 2   Dialysis patient ([place])    M, W, F   Fistula    R arm   GERD (gastroesophageal reflux disease)    Hypertension    Renal disorder    Shortness of breath    Stroke ([place])    right side weakness    Family History  Problem Relation Age of Onset   Cancer Sister    Cancer Brother    Anesthesia problems Neg Hx    Hypotension Neg Hx    Malignant hyperthermia Neg Hx    Pseudochol deficiency Neg Hx     Past Surgical History:  Procedure Laterality Date   AMPUTATION Right 01/13/2020   Procedure: AMPUTATION RIGHT FOOT 1ST RAY;  Surgeon: Newt Minion, MD;  Location: [facility];  Service: Orthopedics;  Laterality: Right;   AV FISTULA PLACEMENT Right 09/08/2013   Procedure: CREATION OF RIGHT BRACHIAL CEPHALIC ARTERIOVENOUS FISTULA ;  Surgeon: Mal Misty, MD;  Location: [facility];  Service: Vascular;  Laterality: Right;   [place] Right 01/26/2014   Procedure: Right Arm BASILIC VEIN TRANSPOSITION;  Surgeon: Mal Misty, MD;  Location: [facility];  Service: Vascular;  Laterality: Right;   BIOPSY  09/10/2021   Procedure: BIOPSY;  Surgeon: Harvel Quale, MD;  [facility];  Service: Gastroenterology;;  gastric   BIOPSY  10/07/2021   Procedure: BIOPSY;  Surgeon: Irving Copas., MD;  Location[facility];  Service: Gastroenterology;;   CATARACT EXTRACTION W/PHACO  11/20/2011   Procedure: CATARACT EXTRACTION PHACO AND INTRAOCULAR LENS PLACEMENT ([place]);  Surgeon: Tonny Branch, MD;  Location: [facility];  Service:  Ophthalmology;  Laterality: Right;  CDE 18.82   CATARACT EXTRACTION W/PHACO Left 11/18/2012   Procedure: CATARACT EXTRACTION PHACO AND INTRAOCULAR LENS PLACEMENT (IOC);  Surgeon: Tonny Branch, MD;  Location: [facility];  Service: Ophthalmology;  Laterality: Left;  CDE: 18.97  COLONOSCOPY N/A 02/09/2013   Procedure: COLONOSCOPY;  Surgeon: Rogene [place], MD;  [facility];  Service: Endoscopy;  Laterality: N/A;  305-moved to 220 Ann to notify pt   COLONOSCOPY N/A 07/08/2018   Procedure: COLONOSCOPY;  Surgeon: Rogene [place], MD;  [facility];  Service: Endoscopy;  Laterality: N/A;  930   ENDOSCOPIC MUCOSAL RESECTION N/A 10/07/2021   Procedure: ENDOSCOPIC MUCOSAL RESECTION;  Surgeon: Rush Landmark Telford Nab., MD;  [facility]: [facility];  Service: Gastroenterology;  Laterality: N/A;   ESOPHAGOGASTRODUODENOSCOPY (EGD) WITH PROPOFOL N/A 09/10/2021   Procedure: ESOPHAGOGASTRODUODENOSCOPY (EGD) WITH PROPOFOL;  Surgeon: Harvel Quale, MD;  [facility];  Service: Gastroenterology;  Laterality: N/A;   ESOPHAGOGASTRODUODENOSCOPY (EGD) WITH PROPOFOL N/A 10/07/2021   Procedure: ESOPHAGOGASTRODUODENOSCOPY (EGD) WITH PROPOFOL;  Surgeon: Rush Landmark Telford Nab., MD;  [facility];  Service: Gastroenterology;  Laterality: N/A;   EUS N/A 10/07/2021   Procedure: UPPER ENDOSCOPIC ULTRASOUND (EUS) RADIAL;  Surgeon: Irving Copas., MD;  [facility];  Service: Gastroenterology;  Laterality: N/A;   GASTRIC VARICES BANDING  10/07/2021   Procedure: duodenal tumor BANDING;  Surgeon: Rush Landmark Telford Nab., MD;  Location: [facility];  Service: Gastroenterology;;  with emr kit   HEMOSTASIS CLIP PLACEMENT  10/07/2021   Procedure: HEMOSTASIS CLIP PLACEMENT;  Surgeon: Irving Copas., MD;  Location: [facility];  Service: Gastroenterology;;   INSERTION OF DIALYSIS CATHETER Right 06/24/2013   Procedure: INSERTION OF DIALYSIS CATHETER: Ultrasound guided;   Surgeon: Serafina Mitchell, MD;  Location: [facility];  Service: Vascular;  Laterality: Right;   LIGATION OF ARTERIOVENOUS  FISTULA Right 01/26/2014   Procedure: LIGATION OF ARTERIOVENOUS  FISTULA;  Surgeon: Mal Misty, MD;  Location: [facility];  Service: Vascular;  Laterality: Right;   LOOP RECORDER IMPLANT N/A 07/21/2013   Procedure: LOOP RECORDER IMPLANT;  Surgeon: Evans Lance, MD;  Location: [facility];  Service: Cardiovascular;  Laterality: N/A;   POLYPECTOMY  07/08/2018   Procedure: POLYPECTOMY;  Surgeon: Rogene [place], MD;  [facility];  Service: Endoscopy;;  Descending colon polyps x 2    SUBMUCOSAL LIFTING INJECTION  10/07/2021   Procedure: SUBMUCOSAL LIFTING INJECTION;  Surgeon: Irving Copas., MD;  [facility];  Service: Gastroenterology;;   TEE WITHOUT CARDIOVERSION N/A 07/21/2013   Procedure: TRANSESOPHAGEAL ECHOCARDIOGRAM (TEE);  Surgeon: Dorothy Spark, MD;  Location: [facility];  Service: Cardiovascular;  Laterality: N/A;   Social History   Occupational History   Not on file  Tobacco Use   Smoking status: Never   Smokeless tobacco: Never  Vaping Use   Vaping Use: Never used  Substance and Sexual Activity   Alcohol use: No   Drug use: No   Sexual activity: Not on file

## 2021-12-13 DIAGNOSIS — N186 End stage renal disease: Secondary | ICD-10-CM | POA: Diagnosis not present

## 2021-12-13 DIAGNOSIS — E1122 Type 2 diabetes mellitus with diabetic chronic kidney disease: Secondary | ICD-10-CM | POA: Diagnosis not present

## 2021-12-13 DIAGNOSIS — Z992 Dependence on renal dialysis: Secondary | ICD-10-CM | POA: Diagnosis not present

## 2021-12-13 DIAGNOSIS — I120 Hypertensive chronic kidney disease with stage 5 chronic kidney disease or end stage renal disease: Secondary | ICD-10-CM | POA: Diagnosis not present

## 2021-12-16 DIAGNOSIS — Z992 Dependence on renal dialysis: Secondary | ICD-10-CM | POA: Diagnosis not present

## 2021-12-16 DIAGNOSIS — N186 End stage renal disease: Secondary | ICD-10-CM | POA: Diagnosis not present

## 2021-12-18 ENCOUNTER — Telehealth: Payer: Self-pay

## 2021-12-18 DIAGNOSIS — N186 End stage renal disease: Secondary | ICD-10-CM | POA: Diagnosis not present

## 2021-12-18 DIAGNOSIS — Z992 Dependence on renal dialysis: Secondary | ICD-10-CM | POA: Diagnosis not present

## 2021-12-18 NOTE — Telephone Encounter (Signed)
See results note. 

## 2021-12-18 NOTE — Telephone Encounter (Signed)
-----   Message from Timothy Lasso, RN sent at 10/14/2021 10:16 AM EDT ----- Arloa Koh plan on repeating the chromogranin a and 2 months and then when that has returned

## 2021-12-20 ENCOUNTER — Other Ambulatory Visit: Payer: Self-pay

## 2021-12-20 DIAGNOSIS — Z992 Dependence on renal dialysis: Secondary | ICD-10-CM | POA: Diagnosis not present

## 2021-12-20 DIAGNOSIS — D3A8 Other benign neuroendocrine tumors: Secondary | ICD-10-CM

## 2021-12-20 DIAGNOSIS — N186 End stage renal disease: Secondary | ICD-10-CM | POA: Diagnosis not present

## 2021-12-22 DIAGNOSIS — Z794 Long term (current) use of insulin: Secondary | ICD-10-CM | POA: Diagnosis not present

## 2021-12-22 DIAGNOSIS — E1122 Type 2 diabetes mellitus with diabetic chronic kidney disease: Secondary | ICD-10-CM | POA: Diagnosis not present

## 2021-12-23 DIAGNOSIS — Z992 Dependence on renal dialysis: Secondary | ICD-10-CM | POA: Diagnosis not present

## 2021-12-23 DIAGNOSIS — E119 Type 2 diabetes mellitus without complications: Secondary | ICD-10-CM | POA: Diagnosis not present

## 2021-12-23 DIAGNOSIS — N186 End stage renal disease: Secondary | ICD-10-CM | POA: Diagnosis not present

## 2021-12-24 DIAGNOSIS — E785 Hyperlipidemia, unspecified: Secondary | ICD-10-CM | POA: Diagnosis not present

## 2021-12-24 DIAGNOSIS — N186 End stage renal disease: Secondary | ICD-10-CM | POA: Diagnosis not present

## 2021-12-24 DIAGNOSIS — E1142 Type 2 diabetes mellitus with diabetic polyneuropathy: Secondary | ICD-10-CM | POA: Diagnosis not present

## 2021-12-24 DIAGNOSIS — E1122 Type 2 diabetes mellitus with diabetic chronic kidney disease: Secondary | ICD-10-CM | POA: Diagnosis not present

## 2021-12-24 DIAGNOSIS — I129 Hypertensive chronic kidney disease with stage 1 through stage 4 chronic kidney disease, or unspecified chronic kidney disease: Secondary | ICD-10-CM | POA: Diagnosis not present

## 2021-12-24 DIAGNOSIS — D510 Vitamin B12 deficiency anemia due to intrinsic factor deficiency: Secondary | ICD-10-CM | POA: Diagnosis not present

## 2021-12-24 DIAGNOSIS — I10 Essential (primary) hypertension: Secondary | ICD-10-CM | POA: Diagnosis not present

## 2021-12-25 DIAGNOSIS — Z992 Dependence on renal dialysis: Secondary | ICD-10-CM | POA: Diagnosis not present

## 2021-12-25 DIAGNOSIS — N186 End stage renal disease: Secondary | ICD-10-CM | POA: Diagnosis not present

## 2021-12-27 DIAGNOSIS — N186 End stage renal disease: Secondary | ICD-10-CM | POA: Diagnosis not present

## 2021-12-27 DIAGNOSIS — Z992 Dependence on renal dialysis: Secondary | ICD-10-CM | POA: Diagnosis not present

## 2021-12-30 DIAGNOSIS — Z992 Dependence on renal dialysis: Secondary | ICD-10-CM | POA: Diagnosis not present

## 2021-12-30 DIAGNOSIS — N186 End stage renal disease: Secondary | ICD-10-CM | POA: Diagnosis not present

## 2021-12-31 ENCOUNTER — Ambulatory Visit (INDEPENDENT_AMBULATORY_CARE_PROVIDER_SITE_OTHER): Payer: Medicare Other | Admitting: Family

## 2021-12-31 ENCOUNTER — Encounter: Payer: Self-pay | Admitting: Family

## 2021-12-31 ENCOUNTER — Other Ambulatory Visit: Payer: Medicare Other

## 2021-12-31 DIAGNOSIS — D3A8 Other benign neuroendocrine tumors: Secondary | ICD-10-CM

## 2021-12-31 DIAGNOSIS — B351 Tinea unguium: Secondary | ICD-10-CM

## 2021-12-31 NOTE — Progress Notes (Signed)
Office Visit Note   Patient: [name]           Date of Birth: [birth year]           MRN: [id number] Visit Date: 12/31/2021              Requested by: [name], [address] [place],  [place] [address] PCP: [name], MD  Chief Complaint  Patient presents with   Right Foot - Follow-up   Left Foot - Follow-up      HPI: Patient is an 81 year old woman who presents today for bilateral nail trim  She is unable to safely trim her own toenails due to insensate neuropathy  She had tried using increased dose of gabapentin for her neuropathy neuropathic pain bilateral feet unfortunately she felt this made her have tremors and spasms and did stop taking the gabapentin.  Assessment & Plan: Visit Diagnoses: No diagnosis found.  Plan: We will stop the gabapentin.  Nails trimmed today without incident.  She will continue with her Rolena Infante walking shoes working on heel cord stretching.  She has an appointment next week with Dr. Lorin Mercy to discuss her lumbar radiculopathy  Follow-Up Instructions: No follow-ups on file.   Ortho Exam  Patient is alert, oriented, no adenopathy, well-dressed, normal affect, normal respiratory effort. On examination bilateral feet there is no edema no erythema no skin breakdown she has thickened and discolored onychomycotic nails x7 these were trimmed today without incident  Imaging: No results found. No images are attached to the encounter.  Labs: Lab Results  Component Value Date   HGBA1C 8.7 (A) 07/25/2021   HGBA1C 8.5 03/25/2021   HGBA1C 9.4 (H) 10/31/2020   ESRSEDRATE 42 (H) 01/06/2020   CRP 20.3 (H) 01/06/2020   REPTSTATUS 11/07/2020 FINAL 11/02/2020   CULT  11/02/2020    NO GROWTH 5 DAYS Performed at [facility], [place] [address], [place],  [address]      Lab Results  Component Value Date   ALBUMIN 2.9 (L) 11/08/2020   ALBUMIN 3.1 (L) 11/07/2020   ALBUMIN 3.0 (L) 11/06/2020    Lab Results  Component  Value Date   MG 1.9 06/26/2013   MG 1.7 01/25/2009   No results found for: "VD25OH"  No results found for: "PREALBUMIN"    Latest Ref Rng & Units 10/07/2021   12:40 PM 10/07/2021   12:30 PM 09/10/2021   12:11 PM  CBC EXTENDED  WBC 4.0 - 10.5 K/uL   7.8   RBC 3.87 - 5.11 MIL/uL   3.30   Hemoglobin 12.0 - 15.0 g/dL 11.9  13.3  9.9   HCT 36.0 - 46.0 % 35.0  39.0  31.7   Platelets 150 - 400 K/uL   91   NEUT# 1.7 - 7.7 K/uL   5.6   Lymph# 0.7 - 4.0 K/uL   1.1      There is no height or weight on file to calculate BMI.  Orders:  No orders of the defined types were placed in this encounter.  No orders of the defined types were placed in this encounter.    Procedures: No procedures performed  Clinical Data: No additional findings.  ROS:  All other systems negative, except as noted in the HPI. Review of Systems  Objective: Vital Signs: There were no vitals taken for this visit.  Specialty Comments:  MRI LUMBAR SPINE WITHOUT CONTRAST   TECHNIQUE: Multiplanar, multisequence MR imaging of the lumbar spine  was performed. No intravenous contrast was administered.   COMPARISON:  CT 03/23/2020   FINDINGS: Segmentation:  Standard.   Alignment: Grade 1 anterolisthesis of L4 on L5. Slight lumbar dextrocurvature.   Vertebrae: No fracture, evidence of discitis, or bone lesion. Superior endplate Schmorl's node of L1.   Conus medullaris and cauda equina: Conus extends to the T12-L1 level. Conus appears normal. Slight bunching of the cauda equina nerve roots above the L4-5 level of stenosis.   Paraspinal and other soft tissues: Colonic diverticulosis. Cortically based T2 hyperintense lesionswithin the bilateral kidneys, incompletely characterized, but most likely represent cysts.   Disc levels:   T12-L1: Minimal disc bulge. Unremarkable facet joints. No foraminal or canal stenosis.   L1-L2: Unremarkable disc. Minimal facet arthropathy. No foraminal or canal  stenosis.   L2-L3: Mild annular disc bulge. Mild bilateral facet arthropathy. No canal stenosis. Borderline-mild right foraminal stenosis.   L3-L4: No disc protrusion. Mild bilateral facet arthropathy. No canal stenosis. Borderline-mild bilateral foraminal stenosis.   L4-L5: Anterolisthesis with disc uncovering and diffuse disc bulge. Severe bilateral facet arthropathy and ligamentum flavum buckling. Findings result in severe canal stenosis with severe left and moderate right foraminal stenosis.   L5-S1: Diffuse disc bulge, eccentric to the right with endplate osteophytic ridging. Mild bilateral facet arthropathy. Moderate right and mild left foraminal stenosis. No canal stenosis.   IMPRESSION: 1. Multilevel lumbar spondylosis as described above, most pronounced at L4-5 where there is severe canal stenosis with severe left and moderate right foraminal stenosis. 2. Moderate right and mild left foraminal stenosis at L5-S1.     Electronically Signed   By: Davina Poke D.O.   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 ([place]) 11/09/2020   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 diabetes mellitus ([place]) 11/09/2020   Dependence on renal dialysis ([place]) 11/09/2020   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 disease ([place]) 11/09/2020   Hyperlipidemia associated with type 2 diabetes mellitus ([place]) 11/09/2020   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 ([place]) 11/09/2020   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 ([place]) 11/09/2020   Chronic constipation 11/09/2020   Vitamin B 12 deficiency 11/09/2020   Acute respiratory failure with hypoxia ([place]) 10/26/2020    Elevated troponin 10/26/2020   Volume overload 10/26/2020   Hypertensive urgency 10/26/2020   History of CVA (cerebrovascular accident) 01/05/2020   Thrombocytopenia ([place]) 01/05/2020   History of colonic polyps 04/01/2018   Acute bronchitis 10/15/2017   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 ([place])    HCAP (healthcare-associated pneumonia) 05/29/2015   Hyperkalemia 05/29/2015   ESRD (end stage renal disease) on dialysis [facility])    Encounter for adequacy testing for hemodialysis ([place]) 01/17/2014   Mechanical complication of other vascular device, implant, and graft 01/17/2014   Disturbances of vision, late effect of stroke 11/03/2013   Right hemiparesis ([place]) 08/19/2013   CVA (cerebral infarction) 07/20/2013   Gastroparesis due to DM ([place]) 07/01/2013   HLD (hyperlipidemia) 05/20/2006   Hereditary and idiopathic peripheral neuropathy 05/20/2006   Hypertension associated with diabetes ([place]) 05/20/2006   Past Medical History:  Diagnosis Date   Anemia    Cataract  Chronic kidney disease    CVA (cerebral infarction)    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   Dialysis patient ([place])    M, W, F   Fistula    R arm   GERD (gastroesophageal reflux disease)    Hypertension    Renal disorder    Shortness of breath    Stroke [facility])    right side weakness    Family History  Problem Relation Age of Onset   Cancer Sister    Cancer Brother    Anesthesia problems Neg Hx    Hypotension Neg Hx    Malignant hyperthermia Neg Hx    Pseudochol deficiency Neg Hx     Past Surgical History:  Procedure Laterality Date   AMPUTATION Right 01/13/2020   Procedure: AMPUTATION RIGHT FOOT 1ST RAY;  Surgeon: Newt Minion, MD;  Location: [place];  Service: Orthopedics;  Laterality: Right;   AV FISTULA PLACEMENT Right 09/08/2013   Procedure: CREATION OF RIGHT BRACHIAL CEPHALIC ARTERIOVENOUS FISTULA ;  Surgeon: Mal Misty, MD;   Location: [place];  Service: Vascular;  Laterality: Right;   [place] Right 01/26/2014   Procedure: Right Arm BASILIC VEIN TRANSPOSITION;  Surgeon: Mal Misty, MD;  Location: [facility];  Service: Vascular;  Laterality: Right;   BIOPSY  09/10/2021   Procedure: BIOPSY;  Surgeon: Harvel Quale, MD;  [facility];  Service: Gastroenterology;;  gastric   BIOPSY  10/07/2021   Procedure: BIOPSY;  Surgeon: Irving Copas., MD;  [facility];  Service: Gastroenterology;;   CATARACT EXTRACTION W/PHACO  11/20/2011   Procedure: CATARACT EXTRACTION PHACO AND INTRAOCULAR LENS PLACEMENT ([place]);  Surgeon: Tonny Branch, MD;  [facility];  Service: Ophthalmology;  Laterality: Right;  CDE 18.82   CATARACT EXTRACTION W/PHACO Left 11/18/2012   Procedure: CATARACT EXTRACTION PHACO AND INTRAOCULAR LENS PLACEMENT ([facility]);  Surgeon: Tonny Branch, MD;  [facility];  Service: Ophthalmology;  Laterality: Left;  CDE: 18.97   COLONOSCOPY N/A 02/09/2013   Procedure: COLONOSCOPY;  Surgeon: Rogene Houston, MD;  [facility];  Service: Endoscopy;  Laterality: N/A;  305-moved to 220 Ann to notify pt   COLONOSCOPY N/A 07/08/2018   Procedure: COLONOSCOPY;  Surgeon: Rogene Houston, MD;  [facility];  Service: Endoscopy;  Laterality: N/A;  930   ENDOSCOPIC MUCOSAL RESECTION N/A 10/07/2021   Procedure: ENDOSCOPIC MUCOSAL RESECTION;  Surgeon: Rush Landmark Telford Nab., MD;  [facility];  Service: Gastroenterology;  Laterality: N/A;   ESOPHAGOGASTRODUODENOSCOPY (EGD) WITH PROPOFOL N/A 09/10/2021   Procedure: ESOPHAGOGASTRODUODENOSCOPY (EGD) WITH PROPOFOL;  Surgeon: Harvel Quale, MD;  [facility];  Service: Gastroenterology;  Laterality: N/A;   ESOPHAGOGASTRODUODENOSCOPY (EGD) WITH PROPOFOL N/A 10/07/2021   Procedure: ESOPHAGOGASTRODUODENOSCOPY (EGD) WITH PROPOFOL;  Surgeon: Rush Landmark Telford Nab., MD;  [facility];   Service: Gastroenterology;  Laterality: N/A;   EUS N/A 10/07/2021   Procedure: UPPER ENDOSCOPIC ULTRASOUND (EUS) RADIAL;  Surgeon: Irving Copas., MD;  [facility];  Service: Gastroenterology;  Laterality: N/A;   GASTRIC VARICES BANDING  10/07/2021   Procedure: duodenal tumor BANDING;  Surgeon: Rush Landmark Telford Nab., MD;  Location[facility];  Service: Gastroenterology;;  with emr kit   HEMOSTASIS CLIP PLACEMENT  10/07/2021   Procedure: HEMOSTASIS CLIP PLACEMENT;  Surgeon: Irving Copas., MD;  Location[facility];  Service: Gastroenterology;;   INSERTION OF DIALYSIS CATHETER Right 06/24/2013   Procedure: INSERTION  OF DIALYSIS CATHETER: Ultrasound guided;  Surgeon: Serafina Mitchell, MD;  Location: [place];  Service: Vascular;  Laterality: Right;   LIGATION OF ARTERIOVENOUS  FISTULA Right 01/26/2014   Procedure: LIGATION OF ARTERIOVENOUS  FISTULA;  Surgeon: Mal Misty, MD;  Location: [place];  Service: Vascular;  Laterality: Right;   LOOP RECORDER IMPLANT N/A 07/21/2013   Procedure: LOOP RECORDER IMPLANT;  Surgeon: Evans Lance, MD;  Location: [facility];  Service: Cardiovascular;  Laterality: N/A;   POLYPECTOMY  07/08/2018   Procedure: POLYPECTOMY;  Surgeon: Rogene Houston, MD;  [facility];  Service: Endoscopy;;  Descending colon polyps x 2    SUBMUCOSAL LIFTING INJECTION  10/07/2021   Procedure: SUBMUCOSAL LIFTING INJECTION;  Surgeon: Irving Copas., MD;  [facility];  Service: Gastroenterology;;   TEE WITHOUT CARDIOVERSION N/A 07/21/2013   Procedure: TRANSESOPHAGEAL ECHOCARDIOGRAM (TEE);  Surgeon: Dorothy Spark, MD;  [facility];  Service: Cardiovascular;  Laterality: N/A;   Social History   Occupational History   Not on file  Tobacco Use   Smoking status: Never   Smokeless tobacco: Never  Vaping Use   Vaping Use: Never used  Substance and Sexual Activity   Alcohol use: No   Drug use: No   Sexual activity:  Not on file

## 2022-01-01 ENCOUNTER — Other Ambulatory Visit: Payer: Self-pay | Admitting: *Deleted

## 2022-01-01 ENCOUNTER — Encounter: Payer: Self-pay | Admitting: *Deleted

## 2022-01-01 DIAGNOSIS — Z992 Dependence on renal dialysis: Secondary | ICD-10-CM | POA: Diagnosis not present

## 2022-01-01 DIAGNOSIS — N186 End stage renal disease: Secondary | ICD-10-CM | POA: Diagnosis not present

## 2022-01-01 NOTE — Patient Outreach (Signed)
[facility]) Care Management  01/01/2022  MCKAYLA MULCAHEY 10/24/1940 [id number]  [phone] RN Health coach telephone call to patient. Spoke with [name] that asked to be called back after 3 pm. She is very interested in the services available.   [place] Care Management [phone]

## 2022-01-02 ENCOUNTER — Other Ambulatory Visit: Payer: Self-pay | Admitting: *Deleted

## 2022-01-02 ENCOUNTER — Encounter: Payer: Self-pay | Admitting: *Deleted

## 2022-01-02 LAB — CHROMOGRANIN A: Chromogranin A (ng/mL): 495.4 ng/mL — ABNORMAL HIGH (ref 0.0–101.8)

## 2022-01-02 NOTE — Patient Outreach (Signed)
RN Health Coach telephone call to patient.  [employer] compliance verified. [place] [facility]) Care Management  01/02/2022  [name] September 20, 1940 [id number]   RN Health Coach telephone call to patient.  [employer] compliance verified. RN described services available through [facility]. RN, Pharmacy and Education officer, museum. RN discussed that possible qualification for medical alert through [facility]. RN gave daughter  number to check and see. Per patient daughter declined [facility] services.   [place] Care Management [phone]

## 2022-01-03 ENCOUNTER — Ambulatory Visit (HOSPITAL_COMMUNITY)
Admission: RE | Admit: 2022-01-03 | Discharge: 2022-01-03 | Disposition: A | Discharge status: Home / Self Care | Payer: Medicare Other | Source: Ambulatory Visit | Attending: Gastroenterology | Admitting: Gastroenterology

## 2022-01-03 DIAGNOSIS — D3A8 Other benign neuroendocrine tumors: Secondary | ICD-10-CM

## 2022-01-03 DIAGNOSIS — C7A010 Malignant carcinoid tumor of the duodenum: Secondary | ICD-10-CM | POA: Diagnosis not present

## 2022-01-03 MED ORDER — COPPER CU 64 DOTATATE 1 MCI/ML IV SOLN
4.0000 | Freq: Once | INTRAVENOUS | Status: AC
  Administered 2022-01-03: 4.06 via INTRAVENOUS

## 2022-01-04 DIAGNOSIS — Z992 Dependence on renal dialysis: Secondary | ICD-10-CM | POA: Diagnosis not present

## 2022-01-04 DIAGNOSIS — N186 End stage renal disease: Secondary | ICD-10-CM | POA: Diagnosis not present

## 2022-01-06 DIAGNOSIS — Z992 Dependence on renal dialysis: Secondary | ICD-10-CM | POA: Diagnosis not present

## 2022-01-06 DIAGNOSIS — N186 End stage renal disease: Secondary | ICD-10-CM | POA: Diagnosis not present

## 2022-01-07 ENCOUNTER — Ambulatory Visit (INDEPENDENT_AMBULATORY_CARE_PROVIDER_SITE_OTHER): Payer: Medicare Other | Admitting: Orthopaedic Surgery

## 2022-01-07 ENCOUNTER — Encounter: Payer: Self-pay | Admitting: Orthopaedic Surgery

## 2022-01-07 DIAGNOSIS — M48062 Spinal stenosis, lumbar region with neurogenic claudication: Secondary | ICD-10-CM | POA: Diagnosis not present

## 2022-01-07 DIAGNOSIS — M48061 Spinal stenosis, lumbar region without neurogenic claudication: Secondary | ICD-10-CM | POA: Insufficient documentation

## 2022-01-07 NOTE — Progress Notes (Signed)
Office Visit Note   Patient: [name]           Date of Birth: 04/02/1941           MRN: [id number] Visit Date: 01/07/2022              Requested by: [name], [address] [place],  [place] [address] PCP: [name], MD   Assessment & Plan: Visit Diagnoses:  1. Spinal stenosis of lumbar region with neurogenic claudication     Plan: Patient has been on long-term dialysis we discussed problems with bone quality and the degenerative anterolisthesis that is present with intact pars.  She could try 1 Lyrica tablet at night and see if she tolerates that better than the 1 twice daily.  If so she can take 1 tablet at night for several weeks and then add in the next tablet since it is a capsule and cannot be split in half.  She can return in 2 months.  On return she can get lateral lumbar flexion-extension imaging.  Follow-Up Instructions: Return in about 2 months (around 03/10/2022).   Orders:  No orders of the defined types were placed in this encounter.  No orders of the defined types were placed in this encounter.     Procedures: No procedures performed   Clinical Data: No additional findings.   Subjective: Chief Complaint  Patient presents with   Lower Back - Pain    HPI 81 year old female with severe spinal stenosis problems primarily at night with difficulty sleeping.  She been given Lyrica in the past and she states it makes her shaky.  This is a problem for her in dialysis when she has told still while she is being dialyzed.  She is ambulatory with a walker and states she has had problems since she had her toe amputated by Dr. Sharol Given in 2021 and since that time she has noticed she cannot walk well far or stand long and needs to use the walker to prevent falling.  Patient has had some pain medication in the past and PDMP reviewed scores are 310-221 for overall score of 460.  Discussed with patient daughter that when she is having increased symptoms she  could sit and will get relief from her spinal stenosis.  She was placed on Lyrica 75 mg p.o. twice daily.  She is used Tylenol and also Aspercreme.  She has been on dialysis for 12 years.  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  Positive for diabetes.   Objective: Vital Signs: BP (!) 151/66   Pulse 72   Ht 5' 8"  (1.727 m)   Wt 171 lb (77.6 kg)   BMI 26.00 kg/m   Physical Exam Constitutional:      Appearance: She is well-developed.  HENT:     Head: Normocephalic.     Right Ear: External ear normal.     Left Ear: External ear normal. There is no impacted cerumen.  Eyes:     Pupils: Pupils are equal, round, and reactive to light.  Neck:     Thyroid: No thyromegaly.     Trachea: No tracheal deviation.  Cardiovascular:     Rate and Rhythm: Normal rate.  Pulmonary:     Effort: Pulmonary effort is normal.  Abdominal:     Palpations: Abdomen is soft.  Musculoskeletal:     Cervical back: No rigidity.  Skin:  General: Skin is warm and dry.  Neurological:     Mental Status: She is alert and oriented to person, place, and time.  Psychiatric:        Behavior: Behavior normal.     Ortho Exam right arm AV fistula.  Anterior tib gastrocsoleus is active.  Her amputation on the right first ray.  No active plantar foot lesions.  Specialty Comments:  MRI LUMBAR SPINE WITHOUT CONTRAST   TECHNIQUE: Multiplanar, multisequence MR imaging of the lumbar spine was performed. No intravenous contrast was administered.   COMPARISON:  CT 03/23/2020   FINDINGS: Segmentation:  Standard.   Alignment: Grade 1 anterolisthesis of L4 on L5. Slight lumbar dextrocurvature.   Vertebrae: No fracture, evidence of discitis, or bone lesion. Superior endplate Schmorl's node of L1.   Conus medullaris and cauda equina: Conus extends to the T12-L1 level. Conus appears normal. Slight bunching of the cauda equina nerve roots above the  L4-5 level of stenosis.   Paraspinal and other soft tissues: Colonic diverticulosis. Cortically based T2 hyperintense lesionswithin the bilateral kidneys, incompletely characterized, but most likely represent cysts.   Disc levels:   T12-L1: Minimal disc bulge. Unremarkable facet joints. No foraminal or canal stenosis.   L1-L2: Unremarkable disc. Minimal facet arthropathy. No foraminal or canal stenosis.   L2-L3: Mild annular disc bulge. Mild bilateral facet arthropathy. No canal stenosis. Borderline-mild right foraminal stenosis.   L3-L4: No disc protrusion. Mild bilateral facet arthropathy. No canal stenosis. Borderline-mild bilateral foraminal stenosis.   L4-L5: Anterolisthesis with disc uncovering and diffuse disc bulge. Severe bilateral facet arthropathy and ligamentum flavum buckling. Findings result in severe canal stenosis with severe left and moderate right foraminal stenosis.   L5-S1: Diffuse disc bulge, eccentric to the right with endplate osteophytic ridging. Mild bilateral facet arthropathy. Moderate right and mild left foraminal stenosis. No canal stenosis.   IMPRESSION: 1. Multilevel lumbar spondylosis as described above, most pronounced at L4-5 where there is severe canal stenosis with severe left and moderate right foraminal stenosis. 2. Moderate right and mild left foraminal stenosis at L5-S1.     Electronically Signed   By: Davina Poke D.O.   On: 08/24/2021 20:31  Imaging: No results found.   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 ([facility]) 11/09/2020   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 diabetes  mellitus ([facility]) 11/09/2020   Dependence on renal dialysis ([facility]) 11/09/2020   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 disease ([facility]) 11/09/2020   Hyperlipidemia associated with type 2 diabetes mellitus ([place] [facility]) 11/09/2020   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 ([facility]) 11/09/2020   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 ([place]) 11/09/2020   Chronic constipation 11/09/2020   Vitamin B 12 deficiency 11/09/2020   Acute respiratory failure with hypoxia ([place]) 10/26/2020   Elevated troponin 10/26/2020   Volume overload 10/26/2020   Hypertensive urgency 10/26/2020   History of CVA (cerebrovascular accident) 01/05/2020   Thrombocytopenia ([facility]) 01/05/2020   History of colonic polyps 04/01/2018   Acute bronchitis 10/15/2017   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 ([place])    HCAP (healthcare-associated pneumonia) 05/29/2015   Hyperkalemia  05/29/2015   ESRD (end stage renal disease) on dialysis [facility])    Encounter for adequacy testing for hemodialysis ([facility]) 01/17/2014   Mechanical complication of other vascular device, implant, and graft 01/17/2014   Disturbances of vision, late effect of stroke 11/03/2013   Right hemiparesis ([facility]) 08/19/2013   CVA (cerebral infarction) 07/20/2013   Gastroparesis due to DM ([place] [facility]) 07/01/2013   HLD (hyperlipidemia) 05/20/2006   Hereditary and idiopathic peripheral neuropathy 05/20/2006   Hypertension associated with diabetes ([place]) 05/20/2006   Past Medical History:  Diagnosis Date   Anemia    Cataract    Chronic kidney disease    CVA (cerebral infarction)    Diabetes mellitus with ESRD (end-stage renal disease) ([place])    Type 2   Dialysis patient ([facility])    M, W, F   Fistula    R arm   GERD (gastroesophageal reflux disease)    Hypertension    Renal disorder    Shortness of breath    Stroke ([place])    right side weakness    Family History  Problem Relation Age of [place]   Cancer Sister    Cancer  Brother    Anesthesia problems Neg Hx    Hypotension Neg Hx    Malignant hyperthermia Neg Hx    Pseudochol deficiency Neg Hx     Past Surgical History:  Procedure Laterality Date   AMPUTATION Right 01/13/2020   Procedure: AMPUTATION RIGHT FOOT 1ST RAY;  Surgeon: Newt Minion, MD;  Location: [facility];  Service: Orthopedics;  Laterality: Right;   AV FISTULA PLACEMENT Right 09/08/2013   Procedure: CREATION OF RIGHT BRACHIAL CEPHALIC ARTERIOVENOUS FISTULA ;  Surgeon: Mal Misty, MD;  Location: [facility];  Service: Vascular;  Laterality: Right;   [place] Right 01/26/2014   Procedure: Right Arm BASILIC VEIN TRANSPOSITION;  Surgeon: Mal Misty, MD;  Location: [facility] [place];  Service: Vascular;  Laterality: Right;   BIOPSY  09/10/2021   Procedure: BIOPSY;  Surgeon: Harvel Quale, MD;  [facility];  Service: Gastroenterology;;  gastric   BIOPSY  10/07/2021   Procedure: BIOPSY;  Surgeon: Irving Copas., MD;  Location[facility];  Service: Gastroenterology;;   CATARACT EXTRACTION W/PHACO  11/20/2011   Procedure: CATARACT EXTRACTION PHACO AND INTRAOCULAR LENS PLACEMENT ([place]);  Surgeon: Tonny Branch, MD;  [facility];  Service: Ophthalmology;  Laterality: Right;  CDE 18.82   CATARACT EXTRACTION W/PHACO Left 11/18/2012   Procedure: CATARACT EXTRACTION PHACO AND INTRAOCULAR LENS PLACEMENT ([facility]);  Surgeon: Tonny Branch, MD;  [facility];  Service: Ophthalmology;  Laterality: Left;  CDE: 18.97   COLONOSCOPY N/A 02/09/2013   Procedure: COLONOSCOPY;  Surgeon: Rogene Houston, MD;  [facility];  Service: Endoscopy;  Laterality: N/A;  305-moved to 220 Ann to notify pt   COLONOSCOPY N/A 07/08/2018   Procedure: COLONOSCOPY;  Surgeon: Rogene Houston, MD;  [facility];  Service: Endoscopy;  Laterality: N/A;  930   ENDOSCOPIC MUCOSAL RESECTION N/A 10/07/2021   Procedure: ENDOSCOPIC MUCOSAL RESECTION;  Surgeon: Rush Landmark Telford Nab., MD;   [facility];  Service: Gastroenterology;  Laterality: N/A;   ESOPHAGOGASTRODUODENOSCOPY (EGD) WITH PROPOFOL N/A 09/10/2021   Procedure: ESOPHAGOGASTRODUODENOSCOPY (EGD) WITH PROPOFOL;  Surgeon: Harvel Quale, MD;  [facility];  Service: Gastroenterology;  Laterality: N/A;   ESOPHAGOGASTRODUODENOSCOPY (EGD) WITH PROPOFOL N/A 10/07/2021   Procedure: ESOPHAGOGASTRODUODENOSCOPY (EGD) WITH PROPOFOL;  Surgeon: Rush Landmark Telford Nab., MD;  [facility];  Service: Gastroenterology;  Laterality: N/A;   EUS N/A 10/07/2021   Procedure: UPPER ENDOSCOPIC ULTRASOUND (EUS) RADIAL;  Surgeon: Irving Copas., MD;  [facility];  Service: Gastroenterology;  Laterality: N/A;   GASTRIC VARICES BANDING  10/07/2021   Procedure: duodenal tumor BANDING;  Surgeon: Rush Landmark Telford Nab., MD;  Location[facility];  Service: Gastroenterology;;  with emr kit   HEMOSTASIS CLIP PLACEMENT  10/07/2021   Procedure: HEMOSTASIS CLIP PLACEMENT;  Surgeon: Irving Copas., MD;  Location: [facility];  Service: Gastroenterology;;   INSERTION OF DIALYSIS CATHETER Right 06/24/2013   Procedure: INSERTION OF DIALYSIS CATHETER: Ultrasound guided;  Surgeon: Serafina Mitchell, MD;  Location: [facility];  Service: Vascular;  Laterality: Right;   LIGATION OF ARTERIOVENOUS  FISTULA Right 01/26/2014   Procedure: LIGATION OF ARTERIOVENOUS  FISTULA;  Surgeon: Mal Misty, MD;  Location: [facility];  Service: Vascular;  Laterality: Right;   LOOP RECORDER IMPLANT N/A 07/21/2013   Procedure: LOOP RECORDER IMPLANT;  Surgeon: Evans Lance, MD;  Location: [facility];  Service: Cardiovascular;  Laterality: N/A;   POLYPECTOMY  07/08/2018   Procedure: POLYPECTOMY;  Surgeon: Rogene Houston, MD;  [facility];  Service: Endoscopy;;  Descending colon polyps x 2    SUBMUCOSAL LIFTING INJECTION  10/07/2021   Procedure: SUBMUCOSAL LIFTING INJECTION;  Surgeon: Irving Copas., MD;   [facility];  Service: Gastroenterology;;   TEE WITHOUT CARDIOVERSION N/A 07/21/2013   Procedure: TRANSESOPHAGEAL ECHOCARDIOGRAM (TEE);  Surgeon: Dorothy Spark, MD;  Location: [facility];  Service: Cardiovascular;  Laterality: N/A;   Social History   Occupational History   Not on file  Tobacco Use   Smoking status: Never   Smokeless tobacco: Never  Vaping Use   Vaping Use: Never used  Substance and Sexual Activity   Alcohol use: No   Drug use: No   Sexual activity: Not on file

## 2022-01-08 DIAGNOSIS — Z992 Dependence on renal dialysis: Secondary | ICD-10-CM | POA: Diagnosis not present

## 2022-01-08 DIAGNOSIS — N186 End stage renal disease: Secondary | ICD-10-CM | POA: Diagnosis not present

## 2022-01-10 DIAGNOSIS — Z992 Dependence on renal dialysis: Secondary | ICD-10-CM | POA: Diagnosis not present

## 2022-01-10 DIAGNOSIS — N186 End stage renal disease: Secondary | ICD-10-CM | POA: Diagnosis not present

## 2022-01-13 ENCOUNTER — Ambulatory Visit (INDEPENDENT_AMBULATORY_CARE_PROVIDER_SITE_OTHER): Payer: Medicare Other | Admitting: Gastroenterology

## 2022-01-13 DIAGNOSIS — N186 End stage renal disease: Secondary | ICD-10-CM | POA: Diagnosis not present

## 2022-01-13 DIAGNOSIS — Z992 Dependence on renal dialysis: Secondary | ICD-10-CM | POA: Diagnosis not present

## 2022-01-15 ENCOUNTER — Ambulatory Visit: Payer: Self-pay

## 2022-01-15 DIAGNOSIS — E1122 Type 2 diabetes mellitus with diabetic chronic kidney disease: Secondary | ICD-10-CM | POA: Diagnosis not present

## 2022-01-15 DIAGNOSIS — Z794 Long term (current) use of insulin: Secondary | ICD-10-CM | POA: Diagnosis not present

## 2022-01-15 DIAGNOSIS — Z992 Dependence on renal dialysis: Secondary | ICD-10-CM | POA: Diagnosis not present

## 2022-01-15 DIAGNOSIS — N186 End stage renal disease: Secondary | ICD-10-CM | POA: Diagnosis not present

## 2022-01-15 NOTE — Patient Outreach (Signed)
  Care Coordination   01/15/2022 Name: [name] MRN: [id number] DOB: 1941/06/03   Care Coordination Outreach Attempts:  An unsuccessful telephone outreach was attempted today to offer the patient information about available care coordination services as a benefit of their health plan.   Follow Up Plan:  Additional outreach attempts will be made to offer the patient care coordination information and services.   Encounter Outcome:  No Answer  Care Coordination Interventions Activated:  No   Care Coordination Interventions:  No, not indicated    Daneen Schick, BSW, CDP Social Worker, Certified Dementia Practitioner Care Coordination [phone]

## 2022-01-17 DIAGNOSIS — N186 End stage renal disease: Secondary | ICD-10-CM | POA: Diagnosis not present

## 2022-01-17 DIAGNOSIS — Z992 Dependence on renal dialysis: Secondary | ICD-10-CM | POA: Diagnosis not present

## 2022-01-20 DIAGNOSIS — Z992 Dependence on renal dialysis: Secondary | ICD-10-CM | POA: Diagnosis not present

## 2022-01-20 DIAGNOSIS — N186 End stage renal disease: Secondary | ICD-10-CM | POA: Diagnosis not present

## 2022-01-22 DIAGNOSIS — Z992 Dependence on renal dialysis: Secondary | ICD-10-CM | POA: Diagnosis not present

## 2022-01-22 DIAGNOSIS — N186 End stage renal disease: Secondary | ICD-10-CM | POA: Diagnosis not present

## 2022-01-24 DIAGNOSIS — N186 End stage renal disease: Secondary | ICD-10-CM | POA: Diagnosis not present

## 2022-01-24 DIAGNOSIS — Z992 Dependence on renal dialysis: Secondary | ICD-10-CM | POA: Diagnosis not present

## 2022-01-27 DIAGNOSIS — N186 End stage renal disease: Secondary | ICD-10-CM | POA: Diagnosis not present

## 2022-01-27 DIAGNOSIS — Z992 Dependence on renal dialysis: Secondary | ICD-10-CM | POA: Diagnosis not present

## 2022-01-29 ENCOUNTER — Other Ambulatory Visit: Payer: Self-pay

## 2022-01-29 DIAGNOSIS — Z992 Dependence on renal dialysis: Secondary | ICD-10-CM | POA: Diagnosis not present

## 2022-01-29 DIAGNOSIS — N186 End stage renal disease: Secondary | ICD-10-CM | POA: Diagnosis not present

## 2022-01-29 NOTE — Progress Notes (Signed)
error 

## 2022-01-29 NOTE — Progress Notes (Signed)
The proposed treatment discussed in conference is for discussion purpose only and is not a binding recommendation.  The patients have not been physically examined, or presented with their treatment options.  Therefore, final treatment plans cannot be decided.  

## 2022-01-30 ENCOUNTER — Telehealth: Payer: Self-pay | Admitting: "Endocrinology

## 2022-01-30 NOTE — Telephone Encounter (Signed)
Pt has an appt on Monday, looks like she did her labs in May, I am not sure she was suppose to do that. Are those labs ok?

## 2022-01-31 DIAGNOSIS — Z992 Dependence on renal dialysis: Secondary | ICD-10-CM | POA: Diagnosis not present

## 2022-01-31 DIAGNOSIS — N186 End stage renal disease: Secondary | ICD-10-CM | POA: Diagnosis not present

## 2022-02-03 ENCOUNTER — Encounter: Payer: Self-pay | Admitting: "Endocrinology

## 2022-02-03 ENCOUNTER — Ambulatory Visit (INDEPENDENT_AMBULATORY_CARE_PROVIDER_SITE_OTHER): Payer: Medicare Other | Admitting: "Endocrinology

## 2022-02-03 VITALS — BP 128/62 | HR 76 | Ht 68.0 in | Wt 164.6 lb

## 2022-02-03 DIAGNOSIS — N186 End stage renal disease: Secondary | ICD-10-CM

## 2022-02-03 DIAGNOSIS — I1311 Hypertensive heart and chronic kidney disease without heart failure, with stage 5 chronic kidney disease, or end stage renal disease: Secondary | ICD-10-CM | POA: Diagnosis not present

## 2022-02-03 DIAGNOSIS — I120 Hypertensive chronic kidney disease with stage 5 chronic kidney disease or end stage renal disease: Secondary | ICD-10-CM | POA: Diagnosis not present

## 2022-02-03 DIAGNOSIS — Z992 Dependence on renal dialysis: Secondary | ICD-10-CM | POA: Diagnosis not present

## 2022-02-03 DIAGNOSIS — E1122 Type 2 diabetes mellitus with diabetic chronic kidney disease: Secondary | ICD-10-CM

## 2022-02-03 DIAGNOSIS — E1169 Type 2 diabetes mellitus with other specified complication: Secondary | ICD-10-CM

## 2022-02-03 DIAGNOSIS — E785 Hyperlipidemia, unspecified: Secondary | ICD-10-CM | POA: Diagnosis not present

## 2022-02-03 LAB — POCT GLYCOSYLATED HEMOGLOBIN (HGB A1C): HbA1c, POC (controlled diabetic range): 8.1 % — AB (ref 0.0–7.0)

## 2022-02-03 MED ORDER — HUMULIN 70/30 (70-30) 100 UNIT/ML ~~LOC~~ SUSP: 5.0000 [IU] | Freq: Two times a day (BID) | SUBCUTANEOUS | 1 refills | Status: DC

## 2022-02-03 NOTE — Patient Instructions (Signed)
                                     Advice for Weight Management  -For most of us the best way to lose weight is by diet management. Generally speaking, diet management means consuming less calories intentionally which over time brings about progressive weight loss.  This can be achieved more effectively by avoiding ultra processed carbohydrates, processed meats, unhealthy fats.    It is critically important to know your numbers: how much calorie you are consuming and how much calorie you need. More importantly, our carbohydrates sources should be unprocessed naturally occurring  complex starch food items.  It is always important to balance nutrition also by  appropriate intake of proteins (mainly plant-based), healthy fats/oils, plenty of fruits and vegetables.   -The American College of Lifestyle Medicine (ACL M) recommends nutrition derived mostly from Whole Food, Plant Predominant Sources example an apple instead of applesauce or apple pie. Eat Plenty of vegetables, Mushrooms, fruits, Legumes, Whole Grains, Nuts, seeds in lieu of processed meats, processed snacks/pastries red meat, poultry, eggs.  Use only water or unsweetened tea for hydration.  The College also recommends the need to stay away from risky substances including alcohol, smoking; obtaining 7-9 hours of restorative sleep, at least 150 minutes of moderate intensity exercise weekly, importance of healthy social connections, and being mindful of stress and seek help when it is overwhelming.    -Sticking to a routine mealtime to eat 3 meals a day and avoiding unnecessary snacks is shown to have a big role in weight control. Under normal circumstances, the only time we burn stored energy is when we are hungry, so allow  some hunger to take place- hunger means no food between appropriate meal times, only water.  It is not advisable to starve.   -It is better to avoid simple carbohydrates including:  Cakes, Sweet Desserts, Ice Cream, Soda (diet and regular), Sweet Tea, Candies, Chips, Cookies, Store Bought Juices, Alcohol in Excess of  1-2 drinks a day, Lemonade,  Artificial Sweeteners, Doughnuts, Coffee Creamers, "Sugar-free" Products, etc, etc.  This is not a complete list.....    -Consulting with certified diabetes educators is proven to provide you with the most accurate and current information on diet.  Also, you may be  interested in discussing diet options/exchanges , we can schedule a visit with [name], RDN, CDE for individualized nutrition education.  -Exercise: If you are able: 30 -60 minutes a day ,4 days a week, or 150 minutes of moderate intensity exercise weekly.    The longer the better if tolerated.  Combine stretch, strength, and aerobic activities.  If you were told in the past that you have high risk for cardiovascular diseases, or if you are currently symptomatic, you may seek evaluation by your heart doctor prior to initiating moderate to intense exercise programs.                                  Additional Care Considerations for Diabetes/Prediabetes   -Diabetes  is a chronic disease.  The most important care consideration is regular follow-up with your diabetes care provider with the goal being avoiding or delaying its complications and to take advantage of advances in medications and technology.  If appropriate actions are taken early enough, type 2 diabetes can even be   reversed.  Seek information from the right source.  - Whole Food, Plant Predominant Nutrition is highly recommended: Eat Plenty of vegetables, Mushrooms, fruits, Legumes, Whole Grains, Nuts, seeds in lieu of processed meats, processed snacks/pastries red meat, poultry, eggs as recommended by American College of  Lifestyle Medicine (ACLM).  -Type 2 diabetes is known to coexist with other important comorbidities such as high blood pressure and high cholesterol.  It is critical to control not only the  diabetes but also the high blood pressure and high cholesterol to minimize and delay the risk of complications including coronary artery disease, stroke, amputations, blindness, etc.  The good news is that this diet recommendation for type 2 diabetes is also very helpful for managing high cholesterol and high blood blood pressure.  - Studies showed that people with diabetes will benefit from a class of medications known as ACE inhibitors and statins.  Unless there are specific reasons not to be on these medications, the standard of care is to consider getting one from these groups of medications at an optimal doses.  These medications are generally considered safe and proven to help protect the heart and the kidneys.    - People with diabetes are encouraged to initiate and maintain regular follow-up with eye doctors, foot doctors, dentists , and if necessary heart and kidney doctors.     - It is highly recommended that people with diabetes quit smoking or stay away from smoking, and get yearly  flu vaccine and pneumonia vaccine at least every 5 years.  See above for additional recommendations on exercise, sleep, stress management , and healthy social connections.      

## 2022-02-03 NOTE — Progress Notes (Signed)
02/03/2022, 1:34 PM  Endocrinology follow-up note   Subjective:    Patient ID: [name], female    DOB: 06/13/1941.  [name] is being seen in follow-up after she was treated consultation for management of currently uncontrolled symptomatic diabetes requested by  [name], MD. She is accompanied by her daughter to clinic.  Past Medical History:  Diagnosis Date   Anemia    Cataract    Chronic kidney disease    CVA (cerebral infarction)    Diabetes mellitus with ESRD (end-stage renal disease) ([place])    Type 2   Dialysis patient ([place])    M, W, F   Fistula    R arm   GERD (gastroesophageal reflux disease)    Hypertension    Renal disorder    Shortness of breath    Stroke [facility])    right side weakness    Past Surgical History:  Procedure Laterality Date   AMPUTATION Right 01/13/2020   Procedure: AMPUTATION RIGHT FOOT 1ST RAY;  Surgeon: Newt Minion, MD;  Location: [place];  Service: Orthopedics;  Laterality: Right;   AV FISTULA PLACEMENT Right 09/08/2013   Procedure: CREATION OF RIGHT BRACHIAL CEPHALIC ARTERIOVENOUS FISTULA ;  Surgeon: Mal Misty, MD;  Location: [place];  Service: Vascular;  Laterality: Right;   [place] Right 01/26/2014   Procedure: Right Arm BASILIC VEIN TRANSPOSITION;  Surgeon: Mal Misty, MD;  Location: [place];  Service: Vascular;  Laterality: Right;   BIOPSY  09/10/2021   Procedure: BIOPSY;  Surgeon: Harvel Quale, MD;  [facility];  Service: Gastroenterology;;  gastric   BIOPSY  10/07/2021   Procedure: BIOPSY;  Surgeon: Irving Copas., MD;  Location: [facility];  Service: Gastroenterology;;   CATARACT EXTRACTION W/PHACO  11/20/2011   Procedure: CATARACT EXTRACTION PHACO AND INTRAOCULAR LENS PLACEMENT ([place]);  Surgeon: Tonny Branch, MD;  [facility];  Service: Ophthalmology;  Laterality: Right;  CDE 18.82   CATARACT EXTRACTION W/PHACO Left 11/18/2012    Procedure: CATARACT EXTRACTION PHACO AND INTRAOCULAR LENS PLACEMENT (IOC);  Surgeon: Tonny Branch, MD;  [facility];  Service: Ophthalmology;  Laterality: Left;  CDE: 18.97   COLONOSCOPY N/A 02/09/2013   Procedure: COLONOSCOPY;  Surgeon: Rogene Houston, MD;  [facility];  Service: Endoscopy;  Laterality: N/A;  305-moved to 220 Ann to notify pt   COLONOSCOPY N/A 07/08/2018   Procedure: COLONOSCOPY;  Surgeon: Rogene Houston, MD;  [facility];  Service: Endoscopy;  Laterality: N/A;  930   ENDOSCOPIC MUCOSAL RESECTION N/A 10/07/2021   Procedure: ENDOSCOPIC MUCOSAL RESECTION;  Surgeon: Rush Landmark Telford Nab., MD;  [facility];  Service: Gastroenterology;  Laterality: N/A;   ESOPHAGOGASTRODUODENOSCOPY (EGD) WITH PROPOFOL N/A 09/10/2021   Procedure: ESOPHAGOGASTRODUODENOSCOPY (EGD) WITH PROPOFOL;  Surgeon: Harvel Quale, MD;  [facility];  Service: Gastroenterology;  Laterality: N/A;   ESOPHAGOGASTRODUODENOSCOPY (EGD) WITH PROPOFOL N/A 10/07/2021   Procedure: ESOPHAGOGASTRODUODENOSCOPY (EGD) WITH PROPOFOL;  Surgeon: Rush Landmark Telford Nab., MD;  [facility];  Service: Gastroenterology;  Laterality: N/A;   EUS N/A 10/07/2021   Procedure: UPPER ENDOSCOPIC ULTRASOUND (EUS) RADIAL;  Surgeon: Irving Copas., MD;  [facility];  Service: Gastroenterology;  Laterality: N/A;   GASTRIC VARICES BANDING  10/07/2021   Procedure: duodenal tumor BANDING;  Surgeon: Rush Landmark Telford Nab., MD;  Location: [facility];  Service: Gastroenterology;;  with emr kit   HEMOSTASIS [place]  10/07/2021   Procedure: HEMOSTASIS CLIP PLACEMENT;  Surgeon: Irving Copas., MD;  Location: [facility];  Service: Gastroenterology;;   INSERTION OF DIALYSIS CATHETER Right 06/24/2013   Procedure: INSERTION OF DIALYSIS CATHETER: Ultrasound guided;  Surgeon: Serafina Mitchell, MD;  Location: [place];  Service: Vascular;  Laterality: Right;    LIGATION OF ARTERIOVENOUS  FISTULA Right 01/26/2014   Procedure: LIGATION OF ARTERIOVENOUS  FISTULA;  Surgeon: Mal Misty, MD;  Location: [place];  Service: Vascular;  Laterality: Right;   LOOP RECORDER IMPLANT N/A 07/21/2013   Procedure: LOOP RECORDER IMPLANT;  Surgeon: Evans Lance, MD;  Location: [facility];  Service: Cardiovascular;  Laterality: N/A;   POLYPECTOMY  07/08/2018   Procedure: POLYPECTOMY;  Surgeon: Rogene Houston, MD;  [facility];  Service: Endoscopy;;  Descending colon polyps x 2    SUBMUCOSAL LIFTING INJECTION  10/07/2021   Procedure: SUBMUCOSAL LIFTING INJECTION;  Surgeon: Irving Copas., MD;  [facility];  Service: Gastroenterology;;   TEE WITHOUT CARDIOVERSION N/A 07/21/2013   Procedure: TRANSESOPHAGEAL ECHOCARDIOGRAM (TEE);  Surgeon: Dorothy Spark, MD;  Location: [facility];  Service: Cardiovascular;  Laterality: N/A;    Social History   Socioeconomic History   Marital status: Widowed    Spouse name: Not on file   Number of children: Not on file   Years of education: Not on file   Highest education level: Not on file  Occupational History   Not on file  Tobacco Use   Smoking status: Never   Smokeless tobacco: Never  Vaping Use   Vaping Use: Never used  Substance and Sexual Activity   Alcohol use: No   Drug use: No   Sexual activity: Not on file  Other Topics Concern   Not on file  Social History Narrative   ** Merged History Encounter **       Social Determinants of Health   Financial Resource Strain: Not on file  Food Insecurity: Not on file  Transportation Needs: Not on file  Physical Activity: Not on file  Stress: Not on file  Social Connections: Not on file    Family History  Problem Relation Age of Onset   Cancer Sister    Cancer Brother    Anesthesia problems Neg Hx    Hypotension Neg Hx    Malignant hyperthermia Neg Hx    Pseudochol deficiency Neg Hx     Outpatient Encounter Medications as of  02/03/2022  Medication Sig   albuterol (VENTOLIN HFA) 108 (90 Base) MCG/ACT inhaler Inhale 2 puffs into the lungs every 6 (six) hours as needed for wheezing or shortness of breath.   aspirin 325 MG tablet Take 650 mg by mouth daily.   BD INSULIN SYRINGE U/F 31G X 5/16" 0.3 ML MISC Inject 1 each into the skin 2 (two) times daily. as directed   cyanocobalamin (,VITAMIN B-12,) 1000 MCG/ML injection Inject 1,000 mcg into the muscle every 30 (thirty) days.   diclofenac Sodium (VOLTAREN) 1 % GEL Apply 4 g topically 4 (four) times daily as needed. Apply to bilateral knees   insulin NPH-regular Human (HUMULIN 70/30) (70-30) 100 UNIT/ML injection Inject 5-14 Units into the skin 2 (two) times daily with a meal.   labetalol (NORMODYNE) 200 MG tablet Take 200 mg by mouth 2 (two) times daily.   lidocaine-prilocaine (EMLA) cream Apply 1 application topically every Monday, Wednesday, and Friday.   multivitamin (RENA-VIT) TABS tablet Take 1 tablet by mouth at bedtime.  NON FORMULARY Diet: NAS, Cons CHO   omeprazole (PRILOSEC) 40 MG capsule Take 1 capsule (40 mg total) by mouth 2 (two) times daily before a meal.   predniSONE (DELTASONE) 50 MG tablet Take one tablet by mouth once daily for 5 days.   pregabalin (LYRICA) 75 MG capsule Take 1 capsule (75 mg total) by mouth daily.   sevelamer carbonate (RENVELA) 800 MG tablet Take 1 tablet (800 mg total) by mouth 2 (two) times daily before lunch and supper.   sucralfate (CARAFATE) 1 GM/10ML suspension Take 10 mLs (1 g total) by mouth 2 (two) times daily.   traMADol (ULTRAM) 50 MG tablet Take 1 tablet (50 mg total) by mouth every 8 (eight) hours as needed for moderate pain or severe pain.   [DISCONTINUED] insulin NPH-regular Human (HUMULIN 70/30) (70-30) 100 UNIT/ML injection Inject 6-8 Units into the skin 2 (two) times daily with a meal. (Patient taking differently: Inject 5-10 Units into the skin 2 (two) times daily with a meal. Inject 10 units at breakfast and 5 units  at supper)   No facility-administered encounter medications on file as of 02/03/2022.    ALLERGIES: Allergies  Allergen Reactions   Ambien [Zolpidem] Other (See Comments)    Hallucinations    Reglan [Metoclopramide] Other (See Comments)    "makes me crazy"    VACCINATION STATUS: Immunization History  Administered Date(s) Administered   Influenza Whole 03/18/2006   Influenza-Unspecified 03/13/2020   Moderna SARS-COV2 Booster Vaccination 06/20/2020   Moderna Sars-Covid-2 Vaccination 08/31/2019, 09/28/2019   Pneumococcal-Unspecified 03/16/2019    Diabetes She presents for her follow-up diabetic visit. She has type 2 diabetes mellitus. Onset time: Was diagnosed at approximate age of 22 years. Her disease course has been stable. There are no hypoglycemic associated symptoms. Pertinent negatives for hypoglycemia include no confusion, headaches, pallor or seizures. Pertinent negatives for diabetes include no blurred vision, no chest pain, no fatigue, no polydipsia, no polyphagia and no polyuria. There are no hypoglycemic complications. Symptoms are stable. Diabetic complications include nephropathy. 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 Her weight is fluctuating minimally. She is following a generally unhealthy diet. When asked about meal planning, she reported none. She never participates in exercise. Her home blood glucose trend is decreasing steadily. Her breakfast blood glucose range is generally 180-200 mg/dl. Her lunch blood glucose range is generally 180-200 mg/dl. Her dinner blood glucose range is generally 140-180 mg/dl. Her bedtime blood glucose range is generally 140-180 mg/dl. Her overall blood glucose range is 140-180 mg/dl. (She presents with a CGM device showing average blood glucose of 185 remaining stable compared to last  visit.  Her AGP report shows 45% time in range, 35% level 1 hyperglycemia, 17% level 2 hyperglycemia.  2% level 1 hypoglycemia.   Her point-of-care A1c is 8.1%.) An ACE inhibitor/angiotensin II receptor blocker is not being taken.  Hypertension This is a chronic problem. The current episode started more than 1 year ago. Pertinent negatives include no blurred vision, chest pain, headaches, palpitations or shortness of breath. Risk factors for coronary artery disease include diabetes mellitus, obesity, post-menopausal state and sedentary lifestyle. Hypertensive end-organ damage includes kidney disease. Identifiable causes of hypertension include chronic renal disease.     Review of Systems  Constitutional:  Negative for chills, fatigue, fever and unexpected weight change.  HENT:  Negative for trouble swallowing and voice change.   Eyes:  Negative for blurred vision and visual disturbance.  Respiratory:  Negative for cough, shortness of breath and wheezing.   Cardiovascular:  Negative for chest pain, palpitations and leg swelling.  Gastrointestinal:  Negative for diarrhea, nausea and vomiting.  Endocrine: Negative for cold intolerance, heat intolerance, polydipsia, polyphagia and polyuria.  Musculoskeletal:  Negative for arthralgias and myalgias.  Skin:  Negative for color change, pallor, rash and wound.  Neurological:  Negative for seizures and headaches.  Psychiatric/Behavioral:  Negative for confusion and suicidal ideas.     Objective:       02/03/2022   10:55 AM 01/07/2022   10:02 AM 11/20/2021    8:01 AM  Vitals with BMI  Height 5' 8"  5' 8"    Weight 164 lbs 10 oz 171 lbs   BMI 33.82 50.53   Systolic 976 734 193  Diastolic 62 66 82  Pulse 76 72 70    BP 128/62   Pulse 76   Ht 5' 8"  (1.727 m)   Wt 164 lb 9.6 oz (74.7 kg)   BMI 25.03 kg/m   Wt Readings from Last 3 Encounters:  02/03/22 164 lb 9.6 oz (74.7 kg)  01/07/22 171 lb (77.6 kg)  11/20/21 171 lb 8.3 oz (77.8 kg)      CMP ( most recent) CMP     Component Value Date/Time   NA 143 10/07/2021 1240   K 4.5 10/07/2021 1240   CL 105 10/07/2021 1240   CO2 28 09/10/2021 1211   GLUCOSE 160 (H) 10/07/2021 1240   BUN 42 (H) 10/07/2021 1240   CREATININE 5.80 (H) 11/21/2021 0847   CALCIUM 8.0 (L) 09/10/2021 1211   PROT 8.3 (H) 11/04/2020 0143   ALBUMIN 2.9 (L) 11/08/2020 0345   AST 31 11/04/2020 0143   ALT 27 11/04/2020 0143   ALKPHOS 87 11/04/2020 0143   BILITOT 1.6 (H) 11/04/2020 0143   GFRNONAA 6 (L) 09/10/2021 1211   GFRAA 5 (L) 01/16/2020 0650     Diabetic Labs (most recent): Lab Results  Component Value Date   HGBA1C 8.1 (A) 02/03/2022   HGBA1C 8.7 (A) 07/25/2021   HGBA1C 8.5 03/25/2021   MICROALBUR 112.00 03/20/2006     Lipid Panel ( most recent) Lipid Panel     Component Value Date/Time   CHOL 135 11/13/2021 1051   TRIG 71 11/13/2021 1051   HDL 52 11/13/2021 1051   CHOLHDL 2.6 11/13/2021 1051   CHOLHDL 4.0 07/21/2013 0720   VLDL 19 07/21/2013 0720   LDLCALC 69 11/13/2021 1051   LABVLDL 14 11/13/2021 1051      Lab Results  Component Value Date   TSH 2.240 11/13/2021   TSH 1.342 08/19/2013   TSH 1.480 10/16/2006   TSH 1.821 02/26/2006   FREET4 1.22 11/13/2021      Assessment & Plan:   1. 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 Hyperlipidemia associated with type 2 diabetes mellitus ([place]) 3. Hypertensive heart and CKD, ESRD on dialysis ([place])   - LONETTE STEVISON has currently uncontrolled symptomatic type 2 DM since  81 years of age.  She presents with a CGM device showing average blood glucose of 185 remaining stable compared to last visit.  Her AGP report shows 45% time in range, 35% level 1 hyperglycemia, 17% level 2 hyperglycemia.  2% level 1 hypoglycemia.   Her point-of-care A1c is 8.1%.  - I had a long discussion with her about the progressive nature of diabetes and  the pathology behind its  complications. -her diabetes is complicated by ESRD on hemodialysis, obesity/sedentary life and she remains at a high risk for more acute and chronic complications which include CAD, CVA, CKD, retinopathy, and neuropathy. These are all discussed in detail with her.  - I discussed all available options of managing her diabetes including de-escalation of medications. I have counseled her on diet  and weight management  by adopting a Whole Food , Plant Predominant  ( WFPP) nutrition as recommended by [employer] of Lifestyle Medicine. Patient is encouraged to switch to  unprocessed or minimally processed  complex starch, adequate protein intake (mainly plant source), minimal liquid fat ( mainly vegetable oils), plenty of fruits, and vegetables. -  she is advised to stick to a routine mealtimes to eat 3 complete meals a day and snack only when necessary ( to snack only to correct hypoglycemia BG <70 day time or <100 at night).    - she acknowledges that there is a room for improvement in her food and drink choices. - Further Specific Suggestion is made for her to avoid simple carbohydrates  from her diet including Cakes, Sweet Desserts, Ice Cream, Soda (diet and regular), Sweet Tea, Candies, Chips, Cookies, Store Bought Juices, Alcohol in Excess of  1-2 drinks a day, Artificial Sweeteners,  Coffee Creamer, and "Sugar-free" Products. This will help patient to have more stable blood glucose profile and potentially avoid unintended weight gain.  - she will be scheduled with Jearld Fenton, RDN, CDE for individualized diabetes education.  - I have approached her with the following plan to manage  her diabetes and patient agrees:   -She will benefit from premixed insulin and instead of her NPH . She is advised to discontinue her Novolin and start Novolin 70/30 20 units with breakfast and 20 units with supper only if blood glucose readings above 90 mg per DL and only if she is eating.    She is encouraged  to use her CGM continuously.    -She is encouraged to use her CGM to monitor blood glucose continuously.    - Adjustment parameters are given to her for hypo and hyperglycemia in writing. - she is encouraged to call clinic for blood glucose levels less than 70 or above 200 mg /dl. -Non-insulin options are not available for her.  She is advised to increase her Humulin 70/30 to 14 units with breakfast, 5 units with supper only when her blood glucose readings are above 90 mg per DL [place].    - Specific targets for  A1c;  LDL, HDL,  and Triglycerides were discussed with the patient.  2) Blood Pressure /Hypertension: Her blood pressure is controlled to target.  she is advised to continue her current medications including labetalol 100 mg p.o. twice daily.  Blood pressure and management is deferred to nephrology .  3) Lipids/Hyperlipidemia:   Review of her recent lipid panel showed  controlled  LDL at 68 .  She is not on any antilipid treatment.   She will be considered for fasting lipid panel on subsequent visits.   4)  Weight/Diet:  Body mass index is 25.03 kg/m.  -   she is not a candidate for weight loss.  The above detailed WFPP Nutrition will help manage weight as well. Optimal Exercise, Restorative Sleep  information was detailed on discharge instructions.  5) Chronic Care/Health Maintenance:  -she   is encouraged to initiate and continue to follow up with Ophthalmology, Dentist,  Podiatrist at  least yearly or according to recommendations, and advised to   stay away from smoking. I have recommended yearly flu vaccine and pneumonia vaccine at least every 5 years; moderate intensity exercise for up to 150 minutes weekly; and  sleep for 7- 9 hours a day.  - she is  advised to maintain close follow up with [name], MD for primary care needs, as well as her other providers for optimal and coordinated care.  I spent 35 minutes in the care of the patient today including review of labs from  [place], Lipids, Thyroid Function, Hematology (current and previous including abstractions from other facilities); face-to-face time discussing  her blood glucose readings/logs, discussing hypoglycemia and hyperglycemia episodes and symptoms, medications doses, her options of short and long term treatment based on the latest standards of care / guidelines;  discussion about incorporating lifestyle medicine;  and documenting the encounter. Risk reduction counseling performed per USPSTF guidelines to reduce cardiovascular risk factors.     Please refer to Patient Instructions for Blood Glucose Monitoring and Insulin/Medications Dosing Guide"  in media tab for additional information. Please  also refer to " Patient Self Inventory" in the Media  tab for reviewed elements of pertinent patient history.  [name] and her daughter participated in the discussions, expressed understanding, and voiced agreement with the above plans.  All questions were answered to her satisfaction. she is encouraged to contact clinic should she have any questions or concerns prior to her return visit.    Follow up plan: - Return in about 6 months (around 08/06/2022) for F/U with Pre-visit Labs, Meter/CGM/Logs, A1c here.  Glade Lloyd, MD [facility] [address] [place],  [address] Phone: [phone]  Fax: [phone]    02/03/2022, 1:34 PM  This note was partially dictated with voice recognition software. Similar sounding words can be transcribed inadequately or may not  be corrected upon review.

## 2022-02-04 ENCOUNTER — Encounter: Payer: Self-pay | Admitting: Gastroenterology

## 2022-02-04 NOTE — Progress Notes (Signed)
Thanks Valarie Merino!

## 2022-02-04 NOTE — Progress Notes (Signed)
The pt daughter has been advised of the recall.  I have entered the EGD EUS recall.

## 2022-02-04 NOTE — Progress Notes (Signed)
Patient's case was discussed at [facility] recently. Her imaging was reviewed. Plan remains the same that even though dotatate scan was negative she does have an elevated chromogranin A off PPI therapy. Plan for a 59-monthfollow-up EGD/EUS to reevaluate the site of resection. If nothing is found at that time then monitor with yearly follow-up and chromogranin levels due to her age and medical comorbidities and pursue dotatate scan should progressive findings be found. These surveillance procedures can cease when it is felt her other medical comorbidities may be higher risk for her in the long-term.  We will update the patient/family.  Patty, Please let the patient's family know that our plan will be for the 664-monthollow-up EGD/EUS.  We will monitor her and follow-up labs and consider repeat imaging based on what is found at that.  Please make sure her recall is in the system.  FYI DCM

## 2022-02-05 DIAGNOSIS — N186 End stage renal disease: Secondary | ICD-10-CM | POA: Diagnosis not present

## 2022-02-05 DIAGNOSIS — Z992 Dependence on renal dialysis: Secondary | ICD-10-CM | POA: Diagnosis not present

## 2022-02-07 DIAGNOSIS — N186 End stage renal disease: Secondary | ICD-10-CM | POA: Diagnosis not present

## 2022-02-07 DIAGNOSIS — Z992 Dependence on renal dialysis: Secondary | ICD-10-CM | POA: Diagnosis not present

## 2022-02-10 DIAGNOSIS — N186 End stage renal disease: Secondary | ICD-10-CM | POA: Diagnosis not present

## 2022-02-10 DIAGNOSIS — Z992 Dependence on renal dialysis: Secondary | ICD-10-CM | POA: Diagnosis not present

## 2022-02-12 DIAGNOSIS — Z992 Dependence on renal dialysis: Secondary | ICD-10-CM | POA: Diagnosis not present

## 2022-02-12 DIAGNOSIS — N186 End stage renal disease: Secondary | ICD-10-CM | POA: Diagnosis not present

## 2022-02-13 DIAGNOSIS — I120 Hypertensive chronic kidney disease with stage 5 chronic kidney disease or end stage renal disease: Secondary | ICD-10-CM | POA: Diagnosis not present

## 2022-02-13 DIAGNOSIS — N186 End stage renal disease: Secondary | ICD-10-CM | POA: Diagnosis not present

## 2022-02-13 DIAGNOSIS — E1122 Type 2 diabetes mellitus with diabetic chronic kidney disease: Secondary | ICD-10-CM | POA: Diagnosis not present

## 2022-02-13 DIAGNOSIS — Z992 Dependence on renal dialysis: Secondary | ICD-10-CM | POA: Diagnosis not present

## 2022-02-14 DIAGNOSIS — Z992 Dependence on renal dialysis: Secondary | ICD-10-CM | POA: Diagnosis not present

## 2022-02-14 DIAGNOSIS — E1122 Type 2 diabetes mellitus with diabetic chronic kidney disease: Secondary | ICD-10-CM | POA: Diagnosis not present

## 2022-02-14 DIAGNOSIS — Z794 Long term (current) use of insulin: Secondary | ICD-10-CM | POA: Diagnosis not present

## 2022-02-14 DIAGNOSIS — N186 End stage renal disease: Secondary | ICD-10-CM | POA: Diagnosis not present

## 2022-02-17 DIAGNOSIS — Z992 Dependence on renal dialysis: Secondary | ICD-10-CM | POA: Diagnosis not present

## 2022-02-17 DIAGNOSIS — N186 End stage renal disease: Secondary | ICD-10-CM | POA: Diagnosis not present

## 2022-02-19 DIAGNOSIS — Z992 Dependence on renal dialysis: Secondary | ICD-10-CM | POA: Diagnosis not present

## 2022-02-19 DIAGNOSIS — N186 End stage renal disease: Secondary | ICD-10-CM | POA: Diagnosis not present

## 2022-02-21 DIAGNOSIS — Z992 Dependence on renal dialysis: Secondary | ICD-10-CM | POA: Diagnosis not present

## 2022-02-21 DIAGNOSIS — N186 End stage renal disease: Secondary | ICD-10-CM | POA: Diagnosis not present

## 2022-02-24 DIAGNOSIS — Z992 Dependence on renal dialysis: Secondary | ICD-10-CM | POA: Diagnosis not present

## 2022-02-24 DIAGNOSIS — N186 End stage renal disease: Secondary | ICD-10-CM | POA: Diagnosis not present

## 2022-02-26 ENCOUNTER — Other Ambulatory Visit: Payer: Self-pay

## 2022-02-26 ENCOUNTER — Telehealth: Payer: Self-pay

## 2022-02-26 DIAGNOSIS — N186 End stage renal disease: Secondary | ICD-10-CM | POA: Diagnosis not present

## 2022-02-26 DIAGNOSIS — Z992 Dependence on renal dialysis: Secondary | ICD-10-CM | POA: Diagnosis not present

## 2022-02-26 DIAGNOSIS — D3A8 Other benign neuroendocrine tumors: Secondary | ICD-10-CM

## 2022-02-26 NOTE — Telephone Encounter (Signed)
Left message on machine to call back   Instructions mailed and sent to the pt

## 2022-02-26 NOTE — Telephone Encounter (Signed)
EUS has been scheduled for 11/2 at 9 am at [facility]

## 2022-02-27 NOTE — Telephone Encounter (Signed)
Left message on machine to call back  

## 2022-02-27 NOTE — Telephone Encounter (Signed)
EUS scheduled, pt daughter [name] instructed and medications reviewed.  Patient instructions mailed to home and sent to My Chart.  Patient to call with any questions or concerns.

## 2022-02-28 DIAGNOSIS — Z992 Dependence on renal dialysis: Secondary | ICD-10-CM | POA: Diagnosis not present

## 2022-02-28 DIAGNOSIS — N186 End stage renal disease: Secondary | ICD-10-CM | POA: Diagnosis not present

## 2022-03-03 DIAGNOSIS — N186 End stage renal disease: Secondary | ICD-10-CM | POA: Diagnosis not present

## 2022-03-03 DIAGNOSIS — Z992 Dependence on renal dialysis: Secondary | ICD-10-CM | POA: Diagnosis not present

## 2022-03-05 DIAGNOSIS — Z992 Dependence on renal dialysis: Secondary | ICD-10-CM | POA: Diagnosis not present

## 2022-03-05 DIAGNOSIS — N186 End stage renal disease: Secondary | ICD-10-CM | POA: Diagnosis not present

## 2022-03-06 NOTE — Telephone Encounter (Signed)
Returned call to [place]. [name] informed me that she had not received patient's instructions for upcoming EUS. I informed [name] that the letter was sent last week and it could take up to two weeks for her to get it. I asked that [name] call us back next week if she has not received the letter and we will re-mail it next week. [name] verbalized understanding and had no concerns at the end of the call.

## 2022-03-06 NOTE — Telephone Encounter (Signed)
Patient's daughter called, states she would like to speak to  concerning a letter she received. Requesting a call back. Please call to advise.

## 2022-03-07 DIAGNOSIS — Z992 Dependence on renal dialysis: Secondary | ICD-10-CM | POA: Diagnosis not present

## 2022-03-07 DIAGNOSIS — N186 End stage renal disease: Secondary | ICD-10-CM | POA: Diagnosis not present

## 2022-03-10 DIAGNOSIS — Z992 Dependence on renal dialysis: Secondary | ICD-10-CM | POA: Diagnosis not present

## 2022-03-10 DIAGNOSIS — N186 End stage renal disease: Secondary | ICD-10-CM | POA: Diagnosis not present

## 2022-03-11 ENCOUNTER — Ambulatory Visit (INDEPENDENT_AMBULATORY_CARE_PROVIDER_SITE_OTHER): Payer: Medicare Other | Admitting: Orthopaedic Surgery

## 2022-03-11 ENCOUNTER — Ambulatory Visit (INDEPENDENT_AMBULATORY_CARE_PROVIDER_SITE_OTHER): Payer: Medicare Other

## 2022-03-11 VITALS — BP 141/71 | HR 71 | Ht 68.0 in | Wt 164.0 lb

## 2022-03-11 DIAGNOSIS — M5416 Radiculopathy, lumbar region: Secondary | ICD-10-CM | POA: Diagnosis not present

## 2022-03-11 DIAGNOSIS — E538 Deficiency of other specified B group vitamins: Secondary | ICD-10-CM | POA: Diagnosis not present

## 2022-03-11 NOTE — Progress Notes (Signed)
Office Visit Note   Patient: [name]           Date of Birth: 01-22-1941           MRN: [id number] Visit Date: 03/11/2022              Requested by: [name], [address] [place],  [place] [address] PCP: [name], MD   Assessment & Plan: Visit Diagnoses:  1. Lumbar radiculopathy     Plan: Patient has anterolisthesis with severe stenosis at L4-5 with intact pars and degenerative facet changes.  We discussed problems with chronic prednisone, soft bone, dialysis, diabetes increased infection rate with decompression and fusion.  We recommend she ambulate stop sit rest and then she can repeat.  She can use her walker for fall prevention discussed.  We discussed chronic narcotic medication not being helpful when there is central stenosis with neurogenic claudication.  She did get relief with stopping and sitting.  We discussed trying the Lyrica just taking 1 every other day for a while and just taking it at night to tolerate what she can take 1 on a daily basis which would likely give her some degree of relief.  Again I recommended she not start taking chronic narcotics due to tolerance increasing dosage and memory problems as well as falling.  She expressed understanding and agrees.  Follow-Up Instructions: Return if symptoms worsen or fail to improve.   Orders:  Orders Placed This Encounter  Procedures   XR Lumb Spine Flex&Ext Only   No orders of the defined types were placed in this encounter.     Procedures: No procedures performed   Clinical Data: No additional findings.   Subjective: Chief Complaint  Patient presents with   Lower Back - Pain    HPI 81 year old female returns with ongoing problems with claudication symptoms.  She went to the grocery store last month but normally her daughter goes to the store for her.  Daughter is present with her today.  She had taken some tramadol and felt like it helped.  She took Lyrica during the day and  daughter states it made her very moody with mood swings.  Additionally patient has peripheral neuropathy history of previous CVA, hypertension.  Chronic dialysis.  Type 2 diabetes with A1c greater than 8.  Review of Systems all the systems noncontributory to HPI.   Objective: Vital Signs: BP (!) 141/71   Pulse 71   Ht _0  (1.727 m)   Wt 164 lb (74.4 kg)   BMI 24.94 kg/m   Physical Exam HENT:     Head: Normocephalic.     Right Ear: External ear normal.     Left Ear: External ear normal.     Nose: Nose normal.  Eyes:     Extraocular Movements: Extraocular movements intact.     Comments: Glasses  Abdominal:     Palpations: Abdomen is soft.  Skin:    General: Skin is warm.     Capillary Refill: Capillary refill takes less than 2 seconds.  Neurological:     Mental Status: She is alert.     Ortho Exam patient get from sitting sitting to standing she is amatory with a folding walker with 5 inch front wheels.  Negative logroll hips negative straight leg raising 90 degrees.  Specialty Comments:  MRI LUMBAR SPINE WITHOUT CONTRAST   TECHNIQUE: Multiplanar, multisequence MR imaging of the lumbar spine was performed. No intravenous contrast was administered.  COMPARISON:  CT 03/23/2020   FINDINGS: Segmentation:  Standard.   Alignment: Grade 1 anterolisthesis of L4 on L5. Slight lumbar dextrocurvature.   Vertebrae: No fracture, evidence of discitis, or bone lesion. Superior endplate Schmorl's node of L1.   Conus medullaris and cauda equina: Conus extends to the T12-L1 level. Conus appears normal. Slight bunching of the cauda equina nerve roots above the L4-5 level of stenosis.   Paraspinal and other soft tissues: Colonic diverticulosis. Cortically based T2 hyperintense lesionswithin the bilateral kidneys, incompletely characterized, but most likely represent cysts.   Disc levels:   T12-L1: Minimal disc bulge. Unremarkable facet joints. No foraminal or canal  stenosis.   L1-L2: Unremarkable disc. Minimal facet arthropathy. No foraminal or canal stenosis.   L2-L3: Mild annular disc bulge. Mild bilateral facet arthropathy. No canal stenosis. Borderline-mild right foraminal stenosis.   L3-L4: No disc protrusion. Mild bilateral facet arthropathy. No canal stenosis. Borderline-mild bilateral foraminal stenosis.   L4-L5: Anterolisthesis with disc uncovering and diffuse disc bulge. Severe bilateral facet arthropathy and ligamentum flavum buckling. Findings result in severe canal stenosis with severe left and moderate right foraminal stenosis.   L5-S1: Diffuse disc bulge, eccentric to the right with endplate osteophytic ridging. Mild bilateral facet arthropathy. Moderate right and mild left foraminal stenosis. No canal stenosis.   IMPRESSION: 1. Multilevel lumbar spondylosis as described above, most pronounced at L4-5 where there is severe canal stenosis with severe left and moderate right foraminal stenosis. 2. Moderate right and mild left foraminal stenosis at L5-S1.     Electronically Signed   By: Davina Poke D.O.   On: 08/24/2021 20:31  Imaging: No results found.   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 ([place]) 11/09/2020   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 diabetes mellitus ([place]) 11/09/2020   Dependence on renal dialysis ([place]) 11/09/2020   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 disease ([place]) 11/09/2020   Hyperlipidemia associated with type 2 diabetes mellitus ([place]) 11/09/2020   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 ([place]) 11/09/2020   Peripheral vascular disorder due to  diabetes mellitus ([place]) 11/09/2020   Chronic constipation 11/09/2020   Vitamin B 12 deficiency 11/09/2020   Acute respiratory failure with hypoxia ([place]) 10/26/2020   Elevated troponin 10/26/2020   Volume overload 10/26/2020   Hypertensive urgency 10/26/2020   History of CVA (cerebrovascular accident) 01/05/2020   Thrombocytopenia ([place]) 01/05/2020   History of colonic polyps 04/01/2018   Acute bronchitis 10/15/2017   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 ([place])    HCAP (healthcare-associated pneumonia) 05/29/2015   Hyperkalemia 05/29/2015   ESRD (end stage renal disease) on dialysis [facility])    Encounter for adequacy testing for hemodialysis ([place]) 01/17/2014   Mechanical complication of other vascular device, implant, and graft 01/17/2014   Disturbances of vision, late effect of stroke 11/03/2013   Right hemiparesis ([place]) 08/19/2013   CVA (cerebral infarction) 07/20/2013   Gastroparesis due to DM ([place]) 07/01/2013   HLD (hyperlipidemia) 05/20/2006   Hereditary and idiopathic peripheral neuropathy 05/20/2006   Hypertension associated with diabetes ([place]) 05/20/2006   Past Medical History:  Diagnosis Date  Anemia    Cataract    Chronic kidney disease    CVA (cerebral infarction)    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   Dialysis patient ([place])    M, W, F   Fistula    R arm   GERD (gastroesophageal reflux disease)    Hypertension    Renal disorder    Shortness of breath    Stroke [facility])    right side weakness    Family History  Problem Relation Age of Onset   Cancer Sister    Cancer Brother    Anesthesia problems Neg Hx    Hypotension Neg Hx    Malignant hyperthermia Neg Hx    Pseudochol deficiency Neg Hx     Past Surgical History:  Procedure Laterality Date   AMPUTATION Right 01/13/2020   Procedure: AMPUTATION RIGHT FOOT 1ST RAY;  Surgeon: Newt Minion, MD;  Location: [place];  Service:  Orthopedics;  Laterality: Right;   AV FISTULA PLACEMENT Right 09/08/2013   Procedure: CREATION OF RIGHT BRACHIAL CEPHALIC ARTERIOVENOUS FISTULA ;  Surgeon: Mal Misty, MD;  Location: [place];  Service: Vascular;  Laterality: Right;   [place] Right 01/26/2014   Procedure: Right Arm BASILIC VEIN TRANSPOSITION;  Surgeon: Mal Misty, MD;  Location: [place];  Service: Vascular;  Laterality: Right;   BIOPSY  09/10/2021   Procedure: BIOPSY;  Surgeon: Harvel Quale, MD;  [facility];  Service: Gastroenterology;;  gastric   BIOPSY  10/07/2021   Procedure: BIOPSY;  Surgeon: Irving Copas., MD;  [facility];  Service: Gastroenterology;;   CATARACT EXTRACTION W/PHACO  11/20/2011   Procedure: CATARACT EXTRACTION PHACO AND INTRAOCULAR LENS PLACEMENT ([place]);  Surgeon: Tonny Branch, MD;  [facility];  Service: Ophthalmology;  Laterality: Right;  CDE 18.82   CATARACT EXTRACTION W/PHACO Left 11/18/2012   Procedure: CATARACT EXTRACTION PHACO AND INTRAOCULAR LENS PLACEMENT (I[facility]);  Surgeon: Tonny Branch, MD;  [facility];  Service: Ophthalmology;  Laterality: Left;  CDE: 18.97   COLONOSCOPY N/A 02/09/2013   Procedure: COLONOSCOPY;  Surgeon: Rogene Houston, MD;  [facility];  Service: Endoscopy;  Laterality: N/A;  305-moved to 220 Ann to notify pt   COLONOSCOPY N/A 07/08/2018   Procedure: COLONOSCOPY;  Surgeon: Rogene Houston, MD;  [facility];  Service: Endoscopy;  Laterality: N/A;  930   ENDOSCOPIC MUCOSAL RESECTION N/A 10/07/2021   Procedure: ENDOSCOPIC MUCOSAL RESECTION;  Surgeon: Rush Landmark Telford Nab., MD;  [facility];  Service: Gastroenterology;  Laterality: N/A;   ESOPHAGOGASTRODUODENOSCOPY (EGD) WITH PROPOFOL N/A 09/10/2021   Procedure: ESOPHAGOGASTRODUODENOSCOPY (EGD) WITH PROPOFOL;  Surgeon: Harvel Quale, MD;  [facility];  Service: Gastroenterology;  Laterality: N/A;    ESOPHAGOGASTRODUODENOSCOPY (EGD) WITH PROPOFOL N/A 10/07/2021   Procedure: ESOPHAGOGASTRODUODENOSCOPY (EGD) WITH PROPOFOL;  Surgeon: Rush Landmark Telford Nab., MD;  [facility];  Service: Gastroenterology;  Laterality: N/A;   EUS N/A 10/07/2021   Procedure: UPPER ENDOSCOPIC ULTRASOUND (EUS) RADIAL;  Surgeon: Irving Copas., MD;  [facility];  Service: Gastroenterology;  Laterality: N/A;   GASTRIC VARICES BANDING  10/07/2021   Procedure: duodenal tumor BANDING;  Surgeon: Rush Landmark Telford Nab., MD;  [facility];  Service: Gastroenterology;;  with emr kit   HEMOSTASIS CLIP PLACEMENT  10/07/2021   Procedure: HEMOSTASIS CLIP PLACEMENT;  Surgeon: Irving Copas., MD;  Location[facility];  Service: Gastroenterology;;   INSERTION OF  DIALYSIS CATHETER Right 06/24/2013   Procedure: INSERTION OF DIALYSIS CATHETER: Ultrasound guided;  Surgeon: Serafina Mitchell, MD;  Location: [place];  Service: Vascular;  Laterality: Right;   LIGATION OF ARTERIOVENOUS  FISTULA Right 01/26/2014   Procedure: LIGATION OF ARTERIOVENOUS  FISTULA;  Surgeon: Mal Misty, MD;  Location: [place];  Service: Vascular;  Laterality: Right;   LOOP RECORDER IMPLANT N/A 07/21/2013   Procedure: LOOP RECORDER IMPLANT;  Surgeon: Evans Lance, MD;  [facility];  Service: Cardiovascular;  Laterality: N/A;   POLYPECTOMY  07/08/2018   Procedure: POLYPECTOMY;  Surgeon: Rogene Houston, MD;  [facility];  Service: Endoscopy;;  Descending colon polyps x 2    SUBMUCOSAL LIFTING INJECTION  10/07/2021   Procedure: SUBMUCOSAL LIFTING INJECTION;  Surgeon: Irving Copas., MD;  [facility];  Service: Gastroenterology;;   TEE WITHOUT CARDIOVERSION N/A 07/21/2013   Procedure: TRANSESOPHAGEAL ECHOCARDIOGRAM (TEE);  Surgeon: Dorothy Spark, MD;  [facility];  Service: Cardiovascular;  Laterality: N/A;   Social History   Occupational History   Not on file   Tobacco Use   Smoking status: Never   Smokeless tobacco: Never  Vaping Use   Vaping Use: Never used  Substance and Sexual Activity   Alcohol use: No   Drug use: No   Sexual activity: Not on file

## 2022-03-12 DIAGNOSIS — Z992 Dependence on renal dialysis: Secondary | ICD-10-CM | POA: Diagnosis not present

## 2022-03-12 DIAGNOSIS — N186 End stage renal disease: Secondary | ICD-10-CM | POA: Diagnosis not present

## 2022-03-14 DIAGNOSIS — N186 End stage renal disease: Secondary | ICD-10-CM | POA: Diagnosis not present

## 2022-03-14 DIAGNOSIS — Z992 Dependence on renal dialysis: Secondary | ICD-10-CM | POA: Diagnosis not present

## 2022-03-15 DIAGNOSIS — Z992 Dependence on renal dialysis: Secondary | ICD-10-CM | POA: Diagnosis not present

## 2022-03-15 DIAGNOSIS — N186 End stage renal disease: Secondary | ICD-10-CM | POA: Diagnosis not present

## 2022-03-16 DIAGNOSIS — E1122 Type 2 diabetes mellitus with diabetic chronic kidney disease: Secondary | ICD-10-CM | POA: Diagnosis not present

## 2022-03-16 DIAGNOSIS — Z794 Long term (current) use of insulin: Secondary | ICD-10-CM | POA: Diagnosis not present

## 2022-03-17 DIAGNOSIS — Z992 Dependence on renal dialysis: Secondary | ICD-10-CM | POA: Diagnosis not present

## 2022-03-17 DIAGNOSIS — Z23 Encounter for immunization: Secondary | ICD-10-CM | POA: Diagnosis not present

## 2022-03-17 DIAGNOSIS — N186 End stage renal disease: Secondary | ICD-10-CM | POA: Diagnosis not present

## 2022-03-19 DIAGNOSIS — N186 End stage renal disease: Secondary | ICD-10-CM | POA: Diagnosis not present

## 2022-03-19 DIAGNOSIS — Z992 Dependence on renal dialysis: Secondary | ICD-10-CM | POA: Diagnosis not present

## 2022-03-19 DIAGNOSIS — Z23 Encounter for immunization: Secondary | ICD-10-CM | POA: Diagnosis not present

## 2022-03-21 DIAGNOSIS — N186 End stage renal disease: Secondary | ICD-10-CM | POA: Diagnosis not present

## 2022-03-21 DIAGNOSIS — Z992 Dependence on renal dialysis: Secondary | ICD-10-CM | POA: Diagnosis not present

## 2022-03-21 DIAGNOSIS — Z23 Encounter for immunization: Secondary | ICD-10-CM | POA: Diagnosis not present

## 2022-03-24 DIAGNOSIS — E119 Type 2 diabetes mellitus without complications: Secondary | ICD-10-CM | POA: Diagnosis not present

## 2022-03-24 DIAGNOSIS — N186 End stage renal disease: Secondary | ICD-10-CM | POA: Diagnosis not present

## 2022-03-24 DIAGNOSIS — Z992 Dependence on renal dialysis: Secondary | ICD-10-CM | POA: Diagnosis not present

## 2022-03-24 DIAGNOSIS — Z23 Encounter for immunization: Secondary | ICD-10-CM | POA: Diagnosis not present

## 2022-03-26 DIAGNOSIS — Z992 Dependence on renal dialysis: Secondary | ICD-10-CM | POA: Diagnosis not present

## 2022-03-26 DIAGNOSIS — Z23 Encounter for immunization: Secondary | ICD-10-CM | POA: Diagnosis not present

## 2022-03-26 DIAGNOSIS — N186 End stage renal disease: Secondary | ICD-10-CM | POA: Diagnosis not present

## 2022-03-28 DIAGNOSIS — Z23 Encounter for immunization: Secondary | ICD-10-CM | POA: Diagnosis not present

## 2022-03-28 DIAGNOSIS — Z992 Dependence on renal dialysis: Secondary | ICD-10-CM | POA: Diagnosis not present

## 2022-03-28 DIAGNOSIS — N186 End stage renal disease: Secondary | ICD-10-CM | POA: Diagnosis not present

## 2022-03-31 DIAGNOSIS — Z992 Dependence on renal dialysis: Secondary | ICD-10-CM | POA: Diagnosis not present

## 2022-03-31 DIAGNOSIS — N186 End stage renal disease: Secondary | ICD-10-CM | POA: Diagnosis not present

## 2022-03-31 DIAGNOSIS — Z23 Encounter for immunization: Secondary | ICD-10-CM | POA: Diagnosis not present

## 2022-04-02 ENCOUNTER — Ambulatory Visit: Payer: Medicare Other | Admitting: Family

## 2022-04-02 DIAGNOSIS — Z992 Dependence on renal dialysis: Secondary | ICD-10-CM | POA: Diagnosis not present

## 2022-04-02 DIAGNOSIS — Z23 Encounter for immunization: Secondary | ICD-10-CM | POA: Diagnosis not present

## 2022-04-02 DIAGNOSIS — N186 End stage renal disease: Secondary | ICD-10-CM | POA: Diagnosis not present

## 2022-04-03 ENCOUNTER — Ambulatory Visit (INDEPENDENT_AMBULATORY_CARE_PROVIDER_SITE_OTHER): Payer: Medicare Other | Admitting: Orthopedic Surgery

## 2022-04-03 DIAGNOSIS — M48062 Spinal stenosis, lumbar region with neurogenic claudication: Secondary | ICD-10-CM

## 2022-04-03 DIAGNOSIS — N186 End stage renal disease: Secondary | ICD-10-CM | POA: Diagnosis not present

## 2022-04-03 DIAGNOSIS — B351 Tinea unguium: Secondary | ICD-10-CM | POA: Diagnosis not present

## 2022-04-03 DIAGNOSIS — E1122 Type 2 diabetes mellitus with diabetic chronic kidney disease: Secondary | ICD-10-CM | POA: Diagnosis not present

## 2022-04-03 DIAGNOSIS — Z992 Dependence on renal dialysis: Secondary | ICD-10-CM | POA: Diagnosis not present

## 2022-04-03 DIAGNOSIS — I120 Hypertensive chronic kidney disease with stage 5 chronic kidney disease or end stage renal disease: Secondary | ICD-10-CM | POA: Diagnosis not present

## 2022-04-04 ENCOUNTER — Encounter: Payer: Self-pay | Admitting: Orthopedic Surgery

## 2022-04-04 DIAGNOSIS — N186 End stage renal disease: Secondary | ICD-10-CM | POA: Diagnosis not present

## 2022-04-04 DIAGNOSIS — Z992 Dependence on renal dialysis: Secondary | ICD-10-CM | POA: Diagnosis not present

## 2022-04-04 DIAGNOSIS — Z23 Encounter for immunization: Secondary | ICD-10-CM | POA: Diagnosis not present

## 2022-04-04 LAB — TSH: TSH: 3.6 u[IU]/mL (ref 0.450–4.500)

## 2022-04-04 LAB — LIPID PANEL
Chol/HDL Ratio: 2.6 ratio (ref 0.0–4.4)
Cholesterol, Total: 128 mg/dL (ref 100–199)
HDL: 50 mg/dL
LDL Chol Calc (NIH): 63 mg/dL (ref 0–99)
Triglycerides: 76 mg/dL (ref 0–149)
VLDL Cholesterol Cal: 15 mg/dL (ref 5–40)

## 2022-04-04 LAB — T4, FREE: Free T4: 1.15 ng/dL (ref 0.82–1.77)

## 2022-04-04 NOTE — Progress Notes (Signed)
Office Visit Note   Patient: [name]           Date of Birth: 03/28/1941           MRN: [id number] Visit Date: 04/03/2022              Requested by: [name], MD [employer] [address] [place],  [place] [address] PCP: [name], MD  Chief Complaint  Patient presents with   Right Foot - Follow-up   Left Foot - Follow-up      HPI: Patient is an 81 year old woman who presents for 2 separate issues.  #1 she complains of painful onychomycotic nails #2 she states she has weakness difficulty using her walker.  Assessment & Plan: Visit Diagnoses:  1. Spinal stenosis of lumbar region with neurogenic claudication   2. Onychomycosis     Plan: A prescription was written for outpatient therapy at [facility].  Reevaluate in 3 months.  Follow-Up Instructions: Return in about 3 months (around 07/04/2022).   Ortho Exam  Patient is alert, oriented, no adenopathy, well-dressed, normal affect, normal respiratory effort. Examination patient has a good dorsalis pedis pulse bilaterally and no arterial insufficiency.  She has no venous stasis swelling.  Negative straight leg raise bilaterally.  Patient has difficulty getting from a sitting to a standing position.  Patient is status post a right first ray amputation and has ischemic pain there are no ulcers.  Patient has thickened discolored onychomycotic nails x8 and the nails were trimmed x8 without complications.  No signs of paronychial infection.  Imaging: No results found. No images are attached to the encounter.  Labs: Lab Results  Component Value Date   HGBA1C 8.1 (A) 02/03/2022   HGBA1C 8.7 (A) 07/25/2021   HGBA1C 8.5 03/25/2021   ESRSEDRATE 42 (H) 01/06/2020   CRP 20.3 (H) 01/06/2020   REPTSTATUS 11/07/2020 FINAL 11/02/2020   CULT  11/02/2020    NO GROWTH 5 DAYS Performed at [facility], [place] [address] [place], [place] [address]      Lab Results  Component Value Date   ALBUMIN 2.9 (L) 11/08/2020    ALBUMIN 3.1 (L) 11/07/2020   ALBUMIN 3.0 (L) 11/06/2020    Lab Results  Component Value Date   MG 1.9 06/26/2013   MG 1.7 01/25/2009   No results found for: "VD25OH"  No results found for: "PREALBUMIN"    Latest Ref Rng & Units 10/07/2021   12:40 PM 10/07/2021   12:30 PM 09/10/2021   12:11 PM  CBC EXTENDED  WBC 4.0 - 10.5 K/uL   7.8   RBC 3.87 - 5.11 MIL/uL   3.30   Hemoglobin 12.0 - 15.0 g/dL 11.9  13.3  9.9   HCT 36.0 - 46.0 % 35.0  39.0  31.7   Platelets 150 - 400 K/uL   91   NEUT# 1.7 - 7.7 K/uL   5.6   Lymph# 0.7 - 4.0 K/uL   1.1      There is no height or weight on file to calculate BMI.  Orders:  Orders Placed This Encounter  Procedures   Ambulatory referral to Physical Therapy   No orders of the defined types were placed in this encounter.    Procedures: No procedures performed  Clinical Data: No additional findings.  ROS:  All other systems negative, except as noted in the HPI. Review of Systems  Objective: Vital Signs: There were no vitals taken for this visit.  Specialty Comments:  MRI LUMBAR SPINE WITHOUT CONTRAST   TECHNIQUE: Multiplanar, multisequence MR imaging of the lumbar spine was performed. No intravenous contrast was administered.   COMPARISON:  CT 03/23/2020   FINDINGS: Segmentation:  Standard.   Alignment: Grade 1 anterolisthesis of L4 on L5. Slight lumbar dextrocurvature.   Vertebrae: No fracture, evidence of discitis, or bone lesion. Superior endplate Schmorl's node of L1.   Conus medullaris and cauda equina: Conus extends to the T12-L1 level. Conus appears normal. Slight bunching of the cauda equina nerve roots above the L4-5 level of stenosis.   Paraspinal and other soft tissues: Colonic diverticulosis. Cortically based T2 hyperintense lesionswithin the bilateral kidneys, incompletely characterized, but most likely represent cysts.   Disc levels:   T12-L1: Minimal disc bulge. Unremarkable facet joints. No  foraminal or canal stenosis.   L1-L2: Unremarkable disc. Minimal facet arthropathy. No foraminal or canal stenosis.   L2-L3: Mild annular disc bulge. Mild bilateral facet arthropathy. No canal stenosis. Borderline-mild right foraminal stenosis.   L3-L4: No disc protrusion. Mild bilateral facet arthropathy. No canal stenosis. Borderline-mild bilateral foraminal stenosis.   L4-L5: Anterolisthesis with disc uncovering and diffuse disc bulge. Severe bilateral facet arthropathy and ligamentum flavum buckling. Findings result in severe canal stenosis with severe left and moderate right foraminal stenosis.   L5-S1: Diffuse disc bulge, eccentric to the right with endplate osteophytic ridging. Mild bilateral facet arthropathy. Moderate right and mild left foraminal stenosis. No canal stenosis.   IMPRESSION: 1. Multilevel lumbar spondylosis as described above, most pronounced at L4-5 where there is severe canal stenosis with severe left and moderate right foraminal stenosis. 2. Moderate right and mild left foraminal stenosis at L5-S1.     Electronically Signed   By: Davina Poke D.O.   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 ([place]) 11/09/2020   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  mellitus ([place]) 11/09/2020   Chronic constipation 11/09/2020   Vitamin B 12 deficiency 11/09/2020   Acute respiratory failure with hypoxia ([place]) 10/26/2020   Elevated troponin 10/26/2020   Volume overload 10/26/2020   Hypertensive urgency 10/26/2020   History of CVA (cerebrovascular accident) 01/05/2020   Thrombocytopenia ([place]) 01/05/2020   History of colonic polyps 04/01/2018   Acute bronchitis 10/15/2017   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 ([place])    HCAP (healthcare-associated pneumonia) 05/29/2015   Hyperkalemia 05/29/2015   ESRD (end stage renal disease) on dialysis [facility])    Encounter for adequacy testing for hemodialysis ([place]) 01/17/2014   Mechanical complication of other vascular device, implant, and graft 01/17/2014   Disturbances of vision, late effect of stroke 11/03/2013   Right hemiparesis ([place]) 08/19/2013   CVA (cerebral infarction) 07/20/2013   Gastroparesis due to DM ([place]) 07/01/2013   HLD (hyperlipidemia) 05/20/2006   Hereditary and idiopathic peripheral neuropathy  05/20/2006   Hypertension associated with diabetes ([place]) 05/20/2006   Past Medical History:  Diagnosis Date   Anemia    Cataract    Chronic kidney disease    CVA (cerebral infarction)    Diabetes mellitus with ESRD (end-stage renal disease) ([place])    Type 2   Dialysis patient ([place])    M, W, F   Fistula    R arm   GERD (gastroesophageal reflux disease)    Hypertension    Renal disorder    Shortness of breath    Stroke [facility])    right side weakness    Family History  Problem Relation Age of Onset   Cancer Sister    Cancer Brother    Anesthesia problems Neg Hx    Hypotension Neg Hx    Malignant hyperthermia Neg Hx    Pseudochol deficiency Neg Hx     Past Surgical History:  Procedure Laterality Date   AMPUTATION Right 01/13/2020   Procedure: AMPUTATION RIGHT FOOT 1ST RAY;  Surgeon: Newt Minion, MD;  Location: [place];  Service: Orthopedics;   Laterality: Right;   AV FISTULA PLACEMENT Right 09/08/2013   Procedure: CREATION OF RIGHT BRACHIAL CEPHALIC ARTERIOVENOUS FISTULA ;  Surgeon: Mal Misty, MD;  Location: [place];  Service: Vascular;  Laterality: Right;   [place] Right 01/26/2014   Procedure: Right Arm BASILIC VEIN TRANSPOSITION;  Surgeon: Mal Misty, MD;  Location: [place];  Service: Vascular;  Laterality: Right;   BIOPSY  09/10/2021   Procedure: BIOPSY;  Surgeon: Harvel Quale, MD;  [facility];  Service: Gastroenterology;;  gastric   BIOPSY  10/07/2021   Procedure: BIOPSY;  Surgeon: Irving Copas., MD;  [facility];  Service: Gastroenterology;;   CATARACT EXTRACTION W/PHACO  11/20/2011   Procedure: CATARACT EXTRACTION PHACO AND INTRAOCULAR LENS PLACEMENT ([place]);  Surgeon: Tonny Branch, MD;  [facility];  Service: Ophthalmology;  Laterality: Right;  CDE 18.82   CATARACT EXTRACTION W/PHACO Left 11/18/2012   Procedure: CATARACT EXTRACTION PHACO AND INTRAOCULAR LENS PLACEMENT (I[facility]);  Surgeon: Tonny Branch, MD;  [facility];  Service: Ophthalmology;  Laterality: Left;  CDE: 18.97   COLONOSCOPY N/A 02/09/2013   Procedure: COLONOSCOPY;  Surgeon: Rogene Houston, MD;  [facility];  Service: Endoscopy;  Laterality: N/A;  305-moved to 220 Ann to notify pt   COLONOSCOPY N/A 07/08/2018   Procedure: COLONOSCOPY;  Surgeon: Rogene Houston, MD;  [facility];  Service: Endoscopy;  Laterality: N/A;  930   ENDOSCOPIC MUCOSAL RESECTION N/A 10/07/2021   Procedure: ENDOSCOPIC MUCOSAL RESECTION;  Surgeon: Rush Landmark Telford Nab., MD;  [facility];  Service: Gastroenterology;  Laterality: N/A;   ESOPHAGOGASTRODUODENOSCOPY (EGD) WITH PROPOFOL N/A 09/10/2021   Procedure: ESOPHAGOGASTRODUODENOSCOPY (EGD) WITH PROPOFOL;  Surgeon: Harvel Quale, MD;  [facility];  Service: Gastroenterology;  Laterality: N/A;   ESOPHAGOGASTRODUODENOSCOPY  (EGD) WITH PROPOFOL N/A 10/07/2021   Procedure: ESOPHAGOGASTRODUODENOSCOPY (EGD) WITH PROPOFOL;  Surgeon: Rush Landmark Telford Nab., MD;  [facility];  Service: Gastroenterology;  Laterality: N/A;   EUS N/A 10/07/2021   Procedure: UPPER ENDOSCOPIC ULTRASOUND (EUS) RADIAL;  Surgeon: Irving Copas., MD;  [facility];  Service: Gastroenterology;  Laterality: N/A;   GASTRIC VARICES BANDING  10/07/2021   Procedure: duodenal tumor BANDING;  Surgeon: Rush Landmark Telford Nab., MD;  [facility];  Service: Gastroenterology;;  with emr kit   HEMOSTASIS CLIP PLACEMENT  10/07/2021   Procedure: HEMOSTASIS  CLIP PLACEMENT;  Surgeon: Mansouraty, Telford Nab., MD;  Location[facility];  Service: Gastroenterology;;   INSERTION OF DIALYSIS CATHETER Right 06/24/2013   Procedure: INSERTION OF DIALYSIS CATHETER: Ultrasound guided;  Surgeon: Serafina Mitchell, MD;  Location: [place];  Service: Vascular;  Laterality: Right;   LIGATION OF ARTERIOVENOUS  FISTULA Right 01/26/2014   Procedure: LIGATION OF ARTERIOVENOUS  FISTULA;  Surgeon: Mal Misty, MD;  Location: [place];  Service: Vascular;  Laterality: Right;   LOOP RECORDER IMPLANT N/A 07/21/2013   Procedure: LOOP RECORDER IMPLANT;  Surgeon: Evans Lance, MD;  Location: [facility] CATH LAB;  Service: Cardiovascular;  Laterality: N/A;   POLYPECTOMY  07/08/2018   Procedure: POLYPECTOMY;  Surgeon: Rogene Houston, MD;  [facility];  Service: Endoscopy;;  Descending colon polyps x 2    SUBMUCOSAL LIFTING INJECTION  10/07/2021   Procedure: SUBMUCOSAL LIFTING INJECTION;  Surgeon: Irving Copas., MD;  [facility];  Service: Gastroenterology;;   TEE WITHOUT CARDIOVERSION N/A 07/21/2013   Procedure: TRANSESOPHAGEAL ECHOCARDIOGRAM (TEE);  Surgeon: Dorothy Spark, MD;  Location: [facility] ENDOSCOPY;  Service: Cardiovascular;  Laterality: N/A;   Social History   Occupational History   Not on file  Tobacco Use   Smoking status:  Never   Smokeless tobacco: Never  Vaping Use   Vaping Use: Never used  Substance and Sexual Activity   Alcohol use: No   Drug use: No   Sexual activity: Not on file

## 2022-04-07 DIAGNOSIS — N186 End stage renal disease: Secondary | ICD-10-CM | POA: Diagnosis not present

## 2022-04-07 DIAGNOSIS — Z23 Encounter for immunization: Secondary | ICD-10-CM | POA: Diagnosis not present

## 2022-04-07 DIAGNOSIS — Z992 Dependence on renal dialysis: Secondary | ICD-10-CM | POA: Diagnosis not present

## 2022-04-08 DIAGNOSIS — Z0000 Encounter for general adult medical examination without abnormal findings: Secondary | ICD-10-CM | POA: Diagnosis not present

## 2022-04-08 DIAGNOSIS — E1122 Type 2 diabetes mellitus with diabetic chronic kidney disease: Secondary | ICD-10-CM | POA: Diagnosis not present

## 2022-04-08 DIAGNOSIS — E1142 Type 2 diabetes mellitus with diabetic polyneuropathy: Secondary | ICD-10-CM | POA: Diagnosis not present

## 2022-04-08 DIAGNOSIS — Z794 Long term (current) use of insulin: Secondary | ICD-10-CM | POA: Diagnosis not present

## 2022-04-08 DIAGNOSIS — I129 Hypertensive chronic kidney disease with stage 1 through stage 4 chronic kidney disease, or unspecified chronic kidney disease: Secondary | ICD-10-CM | POA: Diagnosis not present

## 2022-04-08 DIAGNOSIS — Z89411 Acquired absence of right great toe: Secondary | ICD-10-CM | POA: Diagnosis not present

## 2022-04-08 DIAGNOSIS — D510 Vitamin B12 deficiency anemia due to intrinsic factor deficiency: Secondary | ICD-10-CM | POA: Diagnosis not present

## 2022-04-08 DIAGNOSIS — N186 End stage renal disease: Secondary | ICD-10-CM | POA: Diagnosis not present

## 2022-04-08 DIAGNOSIS — E785 Hyperlipidemia, unspecified: Secondary | ICD-10-CM | POA: Diagnosis not present

## 2022-04-09 DIAGNOSIS — Z992 Dependence on renal dialysis: Secondary | ICD-10-CM | POA: Diagnosis not present

## 2022-04-09 DIAGNOSIS — Z23 Encounter for immunization: Secondary | ICD-10-CM | POA: Diagnosis not present

## 2022-04-09 DIAGNOSIS — N186 End stage renal disease: Secondary | ICD-10-CM | POA: Diagnosis not present

## 2022-04-10 ENCOUNTER — Encounter (HOSPITAL_COMMUNITY): Payer: Self-pay | Admitting: Gastroenterology

## 2022-04-11 DIAGNOSIS — Z23 Encounter for immunization: Secondary | ICD-10-CM | POA: Diagnosis not present

## 2022-04-11 DIAGNOSIS — N186 End stage renal disease: Secondary | ICD-10-CM | POA: Diagnosis not present

## 2022-04-11 DIAGNOSIS — Z992 Dependence on renal dialysis: Secondary | ICD-10-CM | POA: Diagnosis not present

## 2022-04-14 DIAGNOSIS — Z992 Dependence on renal dialysis: Secondary | ICD-10-CM | POA: Diagnosis not present

## 2022-04-14 DIAGNOSIS — Z23 Encounter for immunization: Secondary | ICD-10-CM | POA: Diagnosis not present

## 2022-04-14 DIAGNOSIS — N186 End stage renal disease: Secondary | ICD-10-CM | POA: Diagnosis not present

## 2022-04-15 DIAGNOSIS — N186 End stage renal disease: Secondary | ICD-10-CM | POA: Diagnosis not present

## 2022-04-15 DIAGNOSIS — I120 Hypertensive chronic kidney disease with stage 5 chronic kidney disease or end stage renal disease: Secondary | ICD-10-CM | POA: Diagnosis not present

## 2022-04-15 DIAGNOSIS — Z992 Dependence on renal dialysis: Secondary | ICD-10-CM | POA: Diagnosis not present

## 2022-04-15 DIAGNOSIS — E1122 Type 2 diabetes mellitus with diabetic chronic kidney disease: Secondary | ICD-10-CM | POA: Diagnosis not present

## 2022-04-16 ENCOUNTER — Other Ambulatory Visit: Payer: Self-pay | Admitting: Gastroenterology

## 2022-04-16 DIAGNOSIS — Z992 Dependence on renal dialysis: Secondary | ICD-10-CM | POA: Diagnosis not present

## 2022-04-16 DIAGNOSIS — N186 End stage renal disease: Secondary | ICD-10-CM | POA: Diagnosis not present

## 2022-04-17 ENCOUNTER — Encounter (HOSPITAL_COMMUNITY): Payer: Self-pay | Admitting: Gastroenterology

## 2022-04-17 ENCOUNTER — Other Ambulatory Visit: Payer: Self-pay

## 2022-04-17 ENCOUNTER — Ambulatory Visit (HOSPITAL_COMMUNITY): Payer: Medicare Other | Admitting: Anesthesiology

## 2022-04-17 ENCOUNTER — Ambulatory Visit (HOSPITAL_COMMUNITY)
Admission: RE | Admit: 2022-04-17 | Discharge: 2022-04-17 | Disposition: A | Discharge status: Home / Self Care | Payer: Medicare Other | Attending: Gastroenterology | Admitting: Gastroenterology

## 2022-04-17 ENCOUNTER — Ambulatory Visit (HOSPITAL_BASED_OUTPATIENT_CLINIC_OR_DEPARTMENT_OTHER): Payer: Medicare Other | Admitting: Anesthesiology

## 2022-04-17 ENCOUNTER — Encounter (HOSPITAL_COMMUNITY)
Admission: RE | Disposition: A | Discharge status: Home / Self Care | Payer: Self-pay | Source: Home / Self Care | Attending: Gastroenterology

## 2022-04-17 DIAGNOSIS — K2289 Other specified disease of esophagus: Secondary | ICD-10-CM | POA: Insufficient documentation

## 2022-04-17 DIAGNOSIS — I899 Noninfective disorder of lymphatic vessels and lymph nodes, unspecified: Secondary | ICD-10-CM | POA: Diagnosis not present

## 2022-04-17 DIAGNOSIS — K219 Gastro-esophageal reflux disease without esophagitis: Secondary | ICD-10-CM | POA: Diagnosis not present

## 2022-04-17 DIAGNOSIS — K449 Diaphragmatic hernia without obstruction or gangrene: Secondary | ICD-10-CM | POA: Insufficient documentation

## 2022-04-17 DIAGNOSIS — E1122 Type 2 diabetes mellitus with diabetic chronic kidney disease: Secondary | ICD-10-CM | POA: Diagnosis not present

## 2022-04-17 DIAGNOSIS — K222 Esophageal obstruction: Secondary | ICD-10-CM | POA: Diagnosis not present

## 2022-04-17 DIAGNOSIS — I120 Hypertensive chronic kidney disease with stage 5 chronic kidney disease or end stage renal disease: Secondary | ICD-10-CM | POA: Diagnosis not present

## 2022-04-17 DIAGNOSIS — E1151 Type 2 diabetes mellitus with diabetic peripheral angiopathy without gangrene: Secondary | ICD-10-CM | POA: Insufficient documentation

## 2022-04-17 DIAGNOSIS — Z992 Dependence on renal dialysis: Secondary | ICD-10-CM | POA: Insufficient documentation

## 2022-04-17 DIAGNOSIS — D3A8 Other benign neuroendocrine tumors: Secondary | ICD-10-CM

## 2022-04-17 DIAGNOSIS — Z8673 Personal history of transient ischemic attack (TIA), and cerebral infarction without residual deficits: Secondary | ICD-10-CM | POA: Insufficient documentation

## 2022-04-17 DIAGNOSIS — Z794 Long term (current) use of insulin: Secondary | ICD-10-CM | POA: Insufficient documentation

## 2022-04-17 DIAGNOSIS — Z09 Encounter for follow-up examination after completed treatment for conditions other than malignant neoplasm: Secondary | ICD-10-CM | POA: Insufficient documentation

## 2022-04-17 DIAGNOSIS — K3189 Other diseases of stomach and duodenum: Secondary | ICD-10-CM

## 2022-04-17 DIAGNOSIS — Q399 Congenital malformation of esophagus, unspecified: Secondary | ICD-10-CM | POA: Diagnosis not present

## 2022-04-17 DIAGNOSIS — N186 End stage renal disease: Secondary | ICD-10-CM | POA: Insufficient documentation

## 2022-04-17 DIAGNOSIS — K209 Esophagitis, unspecified without bleeding: Secondary | ICD-10-CM | POA: Diagnosis not present

## 2022-04-17 HISTORY — PX: ESOPHAGOGASTRODUODENOSCOPY: SHX5428

## 2022-04-17 HISTORY — PX: UPPER ESOPHAGEAL ENDOSCOPIC ULTRASOUND (EUS): SHX6562

## 2022-04-17 HISTORY — PX: BIOPSY: SHX5522

## 2022-04-17 LAB — POCT I-STAT, CHEM 8
BUN: 30 mg/dL — ABNORMAL HIGH (ref 8–23)
Calcium, Ion: 1.02 mmol/L — ABNORMAL LOW (ref 1.15–1.40)
Chloride: 98 mmol/L (ref 98–111)
Creatinine, Ser: 5.9 mg/dL — ABNORMAL HIGH (ref 0.44–1.00)
Glucose, Bld: 181 mg/dL — ABNORMAL HIGH (ref 70–99)
HCT: 39 % (ref 36.0–46.0)
Hemoglobin: 13.3 g/dL (ref 12.0–15.0)
Potassium: 4.8 mmol/L (ref 3.5–5.1)
Sodium: 140 mmol/L (ref 135–145)
TCO2: 32 mmol/L (ref 22–32)

## 2022-04-17 SURGERY — UPPER ESOPHAGEAL ENDOSCOPIC ULTRASOUND (EUS)
Anesthesia: Monitor Anesthesia Care

## 2022-04-17 MED ORDER — PROPOFOL 500 MG/50ML IV EMUL
INTRAVENOUS | Status: DC | PRN
  Administered 2022-04-17: 125 ug/kg/min via INTRAVENOUS

## 2022-04-17 MED ORDER — SODIUM CHLORIDE 0.9 % IV SOLN: INTRAVENOUS | Status: DC

## 2022-04-17 MED ORDER — LACTATED RINGERS IV SOLN: INTRAVENOUS | Status: DC

## 2022-04-17 NOTE — Anesthesia Preprocedure Evaluation (Signed)
Anesthesia Evaluation  Patient identified by MRN, date of birth, ID band Patient awake    Reviewed: Allergy & Precautions, NPO status , Patient's Chart, lab work & pertinent test results, reviewed documented beta blocker date and time   Airway Mallampati: II  TM Distance: >3 FB Neck ROM: Full    Dental  (+) Dental Advisory Given, Edentulous Lower, Edentulous Upper   Pulmonary neg pulmonary ROS   Pulmonary exam normal breath sounds clear to auscultation       Cardiovascular hypertension, Pt. on home beta blockers + Peripheral Vascular Disease  Normal cardiovascular exam Rhythm:Regular Rate:Normal     Neuro/Psych  Neuromuscular disease CVA    GI/Hepatic Neg liver ROS,GERD  Medicated,,  Endo/Other  diabetes, Type 2, Insulin Dependent    Renal/GU ESRF and DialysisRenal disease (MWF)     Musculoskeletal negative musculoskeletal ROS (+)    Abdominal   Peds  Hematology negative hematology ROS (+)   Anesthesia Other Findings Day of surgery medications reviewed with the patient.  neuroendocrine tumor  Reproductive/Obstetrics                              Anesthesia Physical Anesthesia Plan  ASA: 3  Anesthesia Plan: MAC   Post-op Pain Management: Minimal or no pain anticipated   Induction: Intravenous  PONV Risk Score and Plan: 2 and TIVA and Treatment may vary due to age or medical condition  Airway Management Planned: Nasal Cannula and Natural Airway  Additional Equipment:   Intra-op Plan:   Post-operative Plan:   Informed Consent: I have reviewed the patients History and Physical, chart, labs and discussed the procedure including the risks, benefits and alternatives for the proposed anesthesia with the patient or authorized representative who has indicated his/her understanding and acceptance.     Dental advisory given  Plan Discussed with: CRNA  Anesthesia Plan Comments:           Anesthesia Quick Evaluation

## 2022-04-17 NOTE — Op Note (Signed)
[facility] Patient Name: [name] Procedure Date: 04/17/2022 MRN: [id number] Attending MD: Justice Britain , MD, [id number] Date of Birth: 22-Aug-1940 CSN: [id number] Age: 81 Admit Type: Outpatient Procedure:                Upper EUS Indications:              Neuroendocrine Tumor Providers:                Justice Britain, MD, Benay Pillow, RN, Cletis Athens, Technician Referring MD:             Barrie Folk. Hill MD, MD, Maylon Peppers Medicines:                Monitored Anesthesia Care Complications:            No immediate complications. Estimated Blood Loss:     Estimated blood loss was minimal. Procedure:                Pre-Anesthesia Assessment:                           - Prior to the procedure, a History and Physical                            was performed, and patient medications and                            allergies were reviewed. The patient's tolerance of                            previous anesthesia was also reviewed. The risks                            and benefits of the procedure and the sedation                            options and risks were discussed with the patient.                            All questions were answered, and informed consent                            was obtained. Prior Anticoagulants: The patient has                            taken no anticoagulant or antiplatelet agents                            except for aspirin. ASA Grade Assessment: III - A                            patient with severe systemic disease. After  reviewing the risks and benefits, the patient was                            deemed in satisfactory condition to undergo the                            procedure.                           After obtaining informed consent, the endoscope was                            passed under direct vision. Throughout the                             procedure, the patient's blood pressure, pulse, and                            oxygen saturations were monitored continuously. The                            [id number] ([id number]) Olympus therapeutic endoscope                            was introduced through the mouth, and advanced to                            the second part of duodenum. The [id number]                            ([id number]) Olympus radial ultrasound scope was                            introduced through the mouth, and advanced to the                            duodenum for ultrasound examination from the                            stomach and duodenum. The upper EUS was                            accomplished without difficulty. The patient                            tolerated the procedure. Scope In: Scope Out: Findings:      ENDOSCOPIC FINDING: :      No gross lesions were noted in the proximal esophagus and in the mid       esophagus.      White nummular lesions were noted in the distal esophagus. Biopsies were       taken with a cold forceps for histology to rule out Candida.      The examined esophagus was significantly tortuous however.      A non-obstructing Schatzki ring was found at the gastroesophageal  junction (35 cm).      A 4 cm hiatal hernia was present.      Localized moderately erythematous mucosa without bleeding was found in       the gastric antrum. This has been previously biopsied and negative for       H. pylori so not rebiopsied.      A medium post mucosectomy scar was found in the duodenal bulb. Overall       this looks to be nice and flat. There was 1 small polypoid appearing       region that was noted on the periphery of this resection scar. Biopsies       were taken with a cold forceps for histology.      No other gross lesions were noted in the duodenal bulb, in the first       portion of the duodenum and in the second portion of the duodenum.      ENDOSONOGRAPHIC FINDING: :       Endosonographic imaging in the duodenal bulb and in the apex of the       duodenal bulb showed no intramural (subepithelial) lesion.      Endosonographic imaging in the body of the stomach, in the antrum of the       stomach and in the pylorus showed no intramural (subepithelial) lesion.      Endosonographic imaging in the visualized portion of the liver showed no       mass.      No malignant-appearing lymph nodes were visualized in the celiac region       (level 20), peripancreatic region and porta hepatis region.      The celiac region was visualized. Impression:               EGD impression:                           - No gross lesions in the proximal esophagus and in                            the mid esophagus.                           - White nummular lesions in esophageal mucosa in                            the distal esophagus. Biopsied to rule out Candida.                           - Tortuous esophagus throughout.                           - Non-obstructing Schatzki ring.                           - 4 cm hiatal hernia.                           - Erythematous mucosa in the antrum -previously                            biopsied  and negative for HP so not rebiopsied.                           - Duodenal scar in the bulb, mostly flat but 1                            small 1 mm appearing polypoid region was noted.                            This was biopsied to rule out recurrent/persistent                            neuroendocrine tumor.                           - No other gross lesions in the duodenal bulb, in                            the first portion of the duodenum and in the second                            portion of the duodenum.                           EUS impression:                           - No evidence of any intramural lesion noted in the                            duodenal bulb and apex of the duodenum.                           - No abnormality noted of the  wall of the stomach                            distally.                           - No malignant-appearing lymph nodes were                            visualized in the celiac region (level 20),                            peripancreatic region and porta hepatis region. Moderate Sedation:      Not Applicable - Patient had care per Anesthesia. Recommendation:           - The patient will be observed post-procedure,                            until all discharge criteria are met.                           -  Discharge patient to home.                           - Patient has a contact number available for                            emergencies. The signs and symptoms of potential                            delayed complications were discussed with the                            patient. Return to normal activities tomorrow.                            Written discharge instructions were provided to the                            patient.                           - Resume previous diet.                           - Await path results.                           - Pending the pathology, if no evidence of                            recurrent neuroendocrine tumor, then would plan a 1                            year follow-up EGD/EUS. If evidence of recurrent                            neuroendocrine tumor is found on these biopsies,                            then 27-monthfollow-up EGD/EUS will be recommended.                           - Observe patient's clinical course.                           - The findings and recommendations were discussed                            with the patient.                           - The findings and recommendations were discussed                            with the patient's family. Procedure Code(s):        --- Professional ---  33354, Esophagogastroduodenoscopy, flexible,                            transoral; with endoscopic  ultrasound examination                            limited to the esophagus, stomach or duodenum, and                            adjacent structures                           43239, Esophagogastroduodenoscopy, flexible,                            transoral; with biopsy, single or multiple Diagnosis Code(s):        --- Professional ---                           K22.89, Other specified disease of esophagus                           Q39.9, Congenital malformation of esophagus,                            unspecified                           K22.2, Esophageal obstruction                           K44.9, Diaphragmatic hernia without obstruction or                            gangrene                           K31.89, Other diseases of stomach and duodenum                           I89.9, Noninfective disorder of lymphatic vessels                            and lymph nodes, unspecified CPT copyright 2022 American [employer]. All rights reserved. The codes documented in this report are preliminary and upon coder review may  be revised to meet current compliance requirements. Justice Britain, MD 04/17/2022 10:14:14 AM Number of Addenda: 0

## 2022-04-17 NOTE — Transfer of Care (Signed)
Immediate Anesthesia Transfer of Care Note  Patient: [name]  Procedure(s) Performed: UPPER ESOPHAGEAL ENDOSCOPIC ULTRASOUND (EUS) ESOPHAGOGASTRODUODENOSCOPY (EGD) BIOPSY  Patient Location: PACU and Endoscopy Unit  Anesthesia Type:MAC  Level of Consciousness: sedated  Airway & Oxygen Therapy: Patient Spontanous Breathing and Patient connected to face mask  Post-op Assessment: Report given to RN and Post -op Vital signs reviewed and stable  Post vital signs: Reviewed  Last Vitals:  Vitals Value Taken Time  BP    Temp    Pulse 80 04/17/22 1006  Resp 17 04/17/22 1006  SpO2 100 % 04/17/22 1006  Vitals shown include unvalidated device data.  Last Pain:  Vitals:   04/17/22 0736  TempSrc: Oral  PainSc: 0-No pain         Complications: No notable events documented.

## 2022-04-17 NOTE — H&P (Signed)
GASTROENTEROLOGY PROCEDURE H&P NOTE   Primary Care Physician: [name], MD  HPI: [name] is a 81 y.o. 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;  Surgeon: Newt Minion, MD;  Location: [place];  Service: Orthopedics;  Laterality: Right;   AV FISTULA PLACEMENT Right 09/08/2013   Procedure: CREATION OF RIGHT BRACHIAL CEPHALIC ARTERIOVENOUS FISTULA ;  Surgeon: Mal Misty, MD;  Location: [place];  Service: Vascular;  Laterality: Right;   [place] Right 01/26/2014   Procedure: Right Arm BASILIC VEIN TRANSPOSITION;  Surgeon: Mal Misty, MD;  Location: [place];  Service: Vascular;  Laterality: Right;   BIOPSY  09/10/2021   Procedure: BIOPSY;  Surgeon: Harvel Quale, MD;  [facility];  Service: Gastroenterology;;  gastric   BIOPSY  10/07/2021   Procedure: BIOPSY;  Surgeon: Irving Copas., MD;  Location: [facility];  Service: Gastroenterology;;   CATARACT EXTRACTION W/PHACO  11/20/2011   Procedure: CATARACT EXTRACTION PHACO AND INTRAOCULAR LENS PLACEMENT ([place]);  Surgeon: Tonny Branch, MD;  [facility];  Service: Ophthalmology;  Laterality: Right;  CDE 18.82   CATARACT EXTRACTION W/PHACO Left 11/18/2012   Procedure: CATARACT EXTRACTION PHACO AND INTRAOCULAR LENS PLACEMENT (I[facility]);  Surgeon: Tonny Branch, MD;   [facility]: [facility];  Service: Ophthalmology;  Laterality: Left;  CDE: 18.97   COLONOSCOPY N/A 02/09/2013   Procedure: COLONOSCOPY;  Surgeon: Rogene Houston, MD;  [facility];  Service: Endoscopy;  Laterality: N/A;  305-moved to 220 Ann to notify pt   COLONOSCOPY N/A 07/08/2018   Procedure: COLONOSCOPY;  Surgeon: Rogene Houston, MD;  [facility];  Service: Endoscopy;  Laterality: N/A;  930   ENDOSCOPIC MUCOSAL RESECTION N/A 10/07/2021   Procedure: ENDOSCOPIC MUCOSAL RESECTION;  Surgeon: Rush Landmark Telford Nab., MD;  [facility];  Service: Gastroenterology;  Laterality: N/A;   ESOPHAGOGASTRODUODENOSCOPY (EGD) WITH PROPOFOL N/A 09/10/2021   Procedure: ESOPHAGOGASTRODUODENOSCOPY (EGD) WITH PROPOFOL;  Surgeon: Harvel Quale, MD;  [facility];  Service: Gastroenterology;  Laterality: N/A;   ESOPHAGOGASTRODUODENOSCOPY (EGD) WITH PROPOFOL N/A 10/07/2021   Procedure: ESOPHAGOGASTRODUODENOSCOPY (EGD) WITH PROPOFOL;  Surgeon: Rush Landmark Telford Nab., MD;  [facility];  Service: Gastroenterology;  Laterality: N/A;   EUS N/A 10/07/2021   Procedure: UPPER ENDOSCOPIC ULTRASOUND (EUS) RADIAL;  Surgeon: Irving Copas., MD;  [facility];  Service: Gastroenterology;  Laterality: N/A;   GASTRIC VARICES BANDING  10/07/2021   Procedure: duodenal tumor BANDING;  Surgeon: Rush Landmark Telford Nab., MD;  Location: [facility];  Service: Gastroenterology;;  with emr kit   HEMOSTASIS CLIP PLACEMENT  10/07/2021   Procedure: HEMOSTASIS CLIP PLACEMENT;  Surgeon: Irving Copas., MD;  [facility];  Service: Gastroenterology;;  INSERTION OF DIALYSIS CATHETER Right 06/24/2013   Procedure: INSERTION OF DIALYSIS CATHETER: Ultrasound guided;  Surgeon: Serafina Mitchell, MD;  Location: ;  Service: Vascular;  Laterality: Right;   LIGATION OF ARTERIOVENOUS  FISTULA Right 01/26/2014   Procedure: LIGATION OF ARTERIOVENOUS  FISTULA;   Surgeon: Mal Misty, MD;  Location: [place];  Service: Vascular;  Laterality: Right;   LOOP RECORDER IMPLANT N/A 07/21/2013   Procedure: LOOP RECORDER IMPLANT;  Surgeon: Evans Lance, MD;  [facility];  Service: Cardiovascular;  Laterality: N/A;   POLYPECTOMY  07/08/2018   Procedure: POLYPECTOMY;  Surgeon: Rogene Houston, MD;  [facility];  Service: Endoscopy;;  Descending colon polyps x 2    SUBMUCOSAL LIFTING INJECTION  10/07/2021   Procedure: SUBMUCOSAL LIFTING INJECTION;  Surgeon: Irving Copas., MD;  [facility];  Service: Gastroenterology;;   TEE WITHOUT CARDIOVERSION N/A 07/21/2013   Procedure: TRANSESOPHAGEAL ECHOCARDIOGRAM (TEE);  Surgeon: Dorothy Spark, MD;  Location: [facility];  Service: Cardiovascular;  Laterality: N/A;   Current Facility-Administered Medications  Medication Dose Route Frequency Provider Last Rate Last Admin   0.9 %  sodium chloride infusion   Intravenous Continuous Mansouraty, Telford Nab., MD       lactated ringers infusion   Intravenous Continuous Mansouraty, Telford Nab., MD        Current Facility-Administered Medications:    0.9 %  sodium chloride infusion, , Intravenous, Continuous, Mansouraty, Telford Nab., MD   lactated ringers infusion, , Intravenous, Continuous, Mansouraty, Telford Nab., MD Allergies  Allergen Reactions   Ambien [Zolpidem] Other (See Comments)    Hallucinations    Reglan [Metoclopramide] Other (See Comments)    "makes me crazy"   Family History  Problem Relation Age of Onset   Cancer Sister    Cancer Brother    Anesthesia problems Neg Hx    Hypotension Neg Hx    Malignant hyperthermia Neg Hx    Pseudochol deficiency Neg Hx    Social History   Socioeconomic History   Marital status: Widowed    Spouse name: Not on file   Number of children: Not on file   Years of education: Not on file   Highest education level: Not on file  Occupational History   Not on file  Tobacco Use    Smoking status: Never   Smokeless tobacco: Never  Vaping Use   Vaping Use: Never used  Substance and Sexual Activity   Alcohol use: No   Drug use: No   Sexual activity: Not on file  Other Topics Concern   Not on file  Social History Narrative   ** Merged History Encounter **       Social Determinants of Health   Financial Resource Strain: Not on file  Food Insecurity: Not on file  Transportation Needs: Not on file  Physical Activity: Not on file  Stress: Not on file  Social Connections: Not on file  Intimate Partner Violence: Not on file    Physical Exam: Today's Vitals   04/17/22 0736  BP: (!) 189/52  Resp: 17  Temp: 98.1 F (36.7 C)  TempSrc: Oral  SpO2: 100%  Weight: 73.9 kg  Height: _0  (1.702 m)  PainSc: 0-No pain   Body mass index is 25.53 kg/m. GEN: NAD EYE: Sclerae anicteric ENT: MMM CV: Non-tachycardic GI: Soft, NT/ND NEURO:  Alert & Oriented x 3  Lab Results: No results for input(s): "WBC", "HGB", "HCT", "PLT" in the last 72  hours. BMET No results for input(s): "NA", "K", "CL", "CO2", "GLUCOSE", "BUN", "CREATININE", "CALCIUM" in the last 72 hours. LFT No results for input(s): "PROT", "ALBUMIN", "AST", "ALT", "ALKPHOS", "BILITOT", "BILIDIR", "IBILI" in the last 72 hours. PT/INR No results for input(s): "LABPROT", "INR" in the last 72 hours.   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  The risks of an EUS including intestinal perforation, bleeding, infection, aspiration, and medication effects were discussed as was the possibility it may not give a definitive diagnosis if a biopsy is performed.  When a biopsy of the pancreas is done as part of the EUS, there is an additional risk of pancreatitis at the rate of about 1-2%.  It was explained that procedure related pancreatitis is typically mild,  although it can be severe and even life threatening, which is why we do not perform random pancreatic biopsies and only biopsy a lesion/area we feel is concerning enough to warrant the risk.  The risks and benefits of endoscopic evaluation/treatment were discussed with the patient and/or family; these include but are not limited to the risk of perforation, infection, bleeding, missed lesions, lack of diagnosis, severe illness requiring hospitalization, as well as anesthesia and sedation related illnesses.  The patient's history has been reviewed, patient examined, no change in status, and deemed stable for procedure.  The patient and/or family is agreeable to proceed.    Justice Britain, MD [facility] Advanced [facility]oscopy Office # [phone]

## 2022-04-17 NOTE — Discharge Instructions (Signed)
YOU HAD AN ENDOSCOPIC PROCEDURE TODAY: Refer to the procedure report and other information in the discharge instructions given to you for any specific questions about what was found during the examination. If this information does not answer your questions, please call [place] office at [phone] to clarify.  ° °YOU SHOULD EXPECT: Some feelings of bloating in the abdomen. Passage of more gas than usual. Walking can help get rid of the air that was put into your GI tract during the procedure and reduce the bloating. If you had a lower endoscopy (such as a colonoscopy or flexible sigmoidoscopy) you may notice spotting of blood in your stool or on the toilet paper. Some abdominal soreness may be present for a day or two, also. ° °DIET: Your first meal following the procedure should be a light meal and then it is ok to progress to your normal diet. A half-sandwich or bowl of soup is an example of a good first meal. Heavy or fried foods are harder to digest and may make you feel nauseous or bloated. Drink plenty of fluids but you should avoid alcoholic beverages for 24 hours. If you had a esophageal dilation, please see attached instructions for diet.   ° °ACTIVITY: Your care partner should take you home directly after the procedure. You should plan to take it easy, moving slowly for the rest of the day. You can resume normal activity the day after the procedure however YOU SHOULD NOT DRIVE, use power tools, machinery or perform tasks that involve climbing or major physical exertion for 24 hours (because of the sedation medicines used during the test).  ° °SYMPTOMS TO REPORT IMMEDIATELY: °A gastroenterologist can be reached at any hour. Please call [phone]  for any of the following symptoms:  °Following lower endoscopy (colonoscopy, flexible sigmoidoscopy) °Excessive amounts of blood in the stool  °Significant tenderness, worsening of abdominal pains  °Swelling of the abdomen that is new, acute  °Fever of 100° or  higher  °Following upper endoscopy (EGD, EUS, ERCP, esophageal dilation) °Vomiting of blood or coffee ground material  °New, significant abdominal pain  °New, significant chest pain or pain under the shoulder blades  °Painful or persistently difficult swallowing  °New shortness of breath  °Black, tarry-looking or red, bloody stools ° °FOLLOW UP:  °If any biopsies were taken you will be contacted by phone or by letter within the next 1-3 weeks. Call [phone]  if you have not heard about the biopsies in 3 weeks.  °Please also call with any specific questions about appointments or follow up tests. ° °

## 2022-04-17 NOTE — Anesthesia Postprocedure Evaluation (Signed)
Anesthesia Post Note  Patient: [name]  Procedure(s) Performed: UPPER ESOPHAGEAL ENDOSCOPIC ULTRASOUND (EUS) ESOPHAGOGASTRODUODENOSCOPY (EGD) BIOPSY     Patient location during evaluation: Endoscopy Anesthesia Type: MAC Level of consciousness: oriented, awake and alert and awake Pain management: pain level controlled Vital Signs Assessment: post-procedure vital signs reviewed and stable Respiratory status: spontaneous breathing, nonlabored ventilation, respiratory function stable and patient connected to nasal cannula oxygen Cardiovascular status: blood pressure returned to baseline and stable Postop Assessment: no headache, no backache and no apparent nausea or vomiting Anesthetic complications: no   No notable events documented.  Last Vitals:  Vitals:   04/17/22 1025 04/17/22 1030  BP: (!) 170/68 (!) 158/84  Pulse: 74 73  Resp: (!) 24 16  Temp:    SpO2: 100% 97%    Last Pain:  Vitals:   04/17/22 1030  TempSrc:   PainSc: 0-No pain                 Santa Lighter

## 2022-04-17 NOTE — Anesthesia Procedure Notes (Signed)
Date/Time: 04/17/2022 9:23 AM  Performed by: Cynda Familia, CRNAPre-anesthesia Checklist: Patient identified, Emergency Drugs available, Patient being monitored, Timeout performed and Suction available Oxygen Delivery Method: Simple face mask Placement Confirmation: breath sounds checked- equal and bilateral and positive ETCO2 Dental Injury: Teeth and Oropharynx as per pre-operative assessment  Comments: Present when case assumed

## 2022-04-18 DIAGNOSIS — Z992 Dependence on renal dialysis: Secondary | ICD-10-CM | POA: Diagnosis not present

## 2022-04-18 DIAGNOSIS — N186 End stage renal disease: Secondary | ICD-10-CM | POA: Diagnosis not present

## 2022-04-20 ENCOUNTER — Encounter (HOSPITAL_COMMUNITY): Payer: Self-pay | Admitting: Gastroenterology

## 2022-04-21 DIAGNOSIS — N186 End stage renal disease: Secondary | ICD-10-CM | POA: Diagnosis not present

## 2022-04-21 DIAGNOSIS — Z992 Dependence on renal dialysis: Secondary | ICD-10-CM | POA: Diagnosis not present

## 2022-04-22 ENCOUNTER — Encounter: Payer: Self-pay | Admitting: Gastroenterology

## 2022-04-22 DIAGNOSIS — I871 Compression of vein: Secondary | ICD-10-CM | POA: Diagnosis not present

## 2022-04-22 DIAGNOSIS — N186 End stage renal disease: Secondary | ICD-10-CM | POA: Diagnosis not present

## 2022-04-22 DIAGNOSIS — Z992 Dependence on renal dialysis: Secondary | ICD-10-CM | POA: Diagnosis not present

## 2022-04-22 DIAGNOSIS — T82858A Stenosis of vascular prosthetic devices, implants and grafts, initial encounter: Secondary | ICD-10-CM | POA: Diagnosis not present

## 2022-04-22 LAB — SURGICAL PATHOLOGY

## 2022-04-25 DIAGNOSIS — N186 End stage renal disease: Secondary | ICD-10-CM | POA: Diagnosis not present

## 2022-04-25 DIAGNOSIS — Z992 Dependence on renal dialysis: Secondary | ICD-10-CM | POA: Diagnosis not present

## 2022-04-28 DIAGNOSIS — N186 End stage renal disease: Secondary | ICD-10-CM | POA: Diagnosis not present

## 2022-04-28 DIAGNOSIS — J069 Acute upper respiratory infection, unspecified: Secondary | ICD-10-CM | POA: Diagnosis not present

## 2022-04-28 DIAGNOSIS — Z992 Dependence on renal dialysis: Secondary | ICD-10-CM | POA: Diagnosis not present

## 2022-04-30 DIAGNOSIS — N186 End stage renal disease: Secondary | ICD-10-CM | POA: Diagnosis not present

## 2022-04-30 DIAGNOSIS — Z992 Dependence on renal dialysis: Secondary | ICD-10-CM | POA: Diagnosis not present

## 2022-05-01 ENCOUNTER — Emergency Department (HOSPITAL_COMMUNITY)
Admission: EM | Admit: 2022-05-01 | Discharge: 2022-05-02 | Disposition: A | Discharge status: Home / Self Care | Payer: Medicare Other | Attending: Emergency Medicine | Admitting: Emergency Medicine

## 2022-05-01 ENCOUNTER — Encounter (HOSPITAL_COMMUNITY): Payer: Self-pay

## 2022-05-01 ENCOUNTER — Emergency Department (HOSPITAL_COMMUNITY): Payer: Medicare Other

## 2022-05-01 DIAGNOSIS — J90 Pleural effusion, not elsewhere classified: Secondary | ICD-10-CM | POA: Diagnosis not present

## 2022-05-01 DIAGNOSIS — Z992 Dependence on renal dialysis: Secondary | ICD-10-CM | POA: Insufficient documentation

## 2022-05-01 DIAGNOSIS — I120 Hypertensive chronic kidney disease with stage 5 chronic kidney disease or end stage renal disease: Secondary | ICD-10-CM | POA: Insufficient documentation

## 2022-05-01 DIAGNOSIS — R059 Cough, unspecified: Secondary | ICD-10-CM | POA: Diagnosis not present

## 2022-05-01 DIAGNOSIS — R0602 Shortness of breath: Secondary | ICD-10-CM | POA: Diagnosis not present

## 2022-05-01 DIAGNOSIS — Z79899 Other long term (current) drug therapy: Secondary | ICD-10-CM | POA: Diagnosis not present

## 2022-05-01 DIAGNOSIS — R0981 Nasal congestion: Secondary | ICD-10-CM

## 2022-05-01 DIAGNOSIS — N186 End stage renal disease: Secondary | ICD-10-CM | POA: Diagnosis not present

## 2022-05-01 DIAGNOSIS — J069 Acute upper respiratory infection, unspecified: Secondary | ICD-10-CM | POA: Insufficient documentation

## 2022-05-01 DIAGNOSIS — Z794 Long term (current) use of insulin: Secondary | ICD-10-CM | POA: Diagnosis not present

## 2022-05-01 DIAGNOSIS — R062 Wheezing: Secondary | ICD-10-CM | POA: Diagnosis not present

## 2022-05-01 DIAGNOSIS — Z7982 Long term (current) use of aspirin: Secondary | ICD-10-CM | POA: Diagnosis not present

## 2022-05-01 DIAGNOSIS — E1122 Type 2 diabetes mellitus with diabetic chronic kidney disease: Secondary | ICD-10-CM | POA: Insufficient documentation

## 2022-05-01 LAB — CBC WITH DIFFERENTIAL/PLATELET
Abs Immature Granulocytes: 0.02 10*3/uL (ref 0.00–0.07)
Basophils Absolute: 0 10*3/uL (ref 0.0–0.1)
Basophils Relative: 0 %
Eosinophils Absolute: 0.2 10*3/uL (ref 0.0–0.5)
Eosinophils Relative: 3 %
HCT: 32 % — ABNORMAL LOW (ref 36.0–46.0)
Hemoglobin: 10.3 g/dL — ABNORMAL LOW (ref 12.0–15.0)
Immature Granulocytes: 0 %
Lymphocytes Relative: 16 %
Lymphs Abs: 1.1 10*3/uL (ref 0.7–4.0)
MCH: 31.1 pg (ref 26.0–34.0)
MCHC: 32.2 g/dL (ref 30.0–36.0)
MCV: 96.7 fL (ref 80.0–100.0)
Monocytes Absolute: 1 10*3/uL (ref 0.1–1.0)
Monocytes Relative: 14 %
Neutro Abs: 4.8 10*3/uL (ref 1.7–7.7)
Neutrophils Relative %: 67 %
Platelets: 73 10*3/uL — ABNORMAL LOW (ref 150–400)
RBC: 3.31 MIL/uL — ABNORMAL LOW (ref 3.87–5.11)
RDW: 15.2 % (ref 11.5–15.5)
WBC: 7.2 10*3/uL (ref 4.0–10.5)
nRBC: 0 % (ref 0.0–0.2)

## 2022-05-01 LAB — COMPREHENSIVE METABOLIC PANEL
ALT: 15 U/L (ref 0–44)
AST: 27 U/L (ref 15–41)
Albumin: 3.3 g/dL — ABNORMAL LOW (ref 3.5–5.0)
Alkaline Phosphatase: 60 U/L (ref 38–126)
Anion gap: 14 (ref 5–15)
BUN: 27 mg/dL — ABNORMAL HIGH (ref 8–23)
CO2: 26 mmol/L (ref 22–32)
Calcium: 8.1 mg/dL — ABNORMAL LOW (ref 8.9–10.3)
Chloride: 100 mmol/L (ref 98–111)
Creatinine, Ser: 7.01 mg/dL — ABNORMAL HIGH (ref 0.44–1.00)
GFR, Estimated: 5 mL/min — ABNORMAL LOW (ref >60)
Glucose, Bld: 275 mg/dL — ABNORMAL HIGH (ref 70–99)
Potassium: 3.7 mmol/L (ref 3.5–5.1)
Sodium: 140 mmol/L (ref 135–145)
Total Bilirubin: 0.7 mg/dL (ref 0.3–1.2)
Total Protein: 6.9 g/dL (ref 6.5–8.1)

## 2022-05-01 NOTE — ED Triage Notes (Signed)
Pt arrives POV for eval of SOB, congestion and cough. Pt reports she was seen at [facility] on Monday and states neg for covid/flu/strep at that time. States SOB ongoing and worsening since that time. Denies CP.

## 2022-05-01 NOTE — ED Provider Triage Note (Signed)
Emergency Medicine Provider Triage Evaluation Note  [name] , a 81 y.o. female  was evaluated in triage.  Pt complains of cough and congestion. She states that same has been ongoing for 1 week. Went to Urgent Care on Monday and had negative COVID swab. Did not have CXR. States she was prescribed cough medicine which has not helped. Patient is MWF dialysis and has not missed any sessions. States that she is mildly short of breath, denies chest pain. No nausea, vomiting, diarrhea, leg pain, or leg swelling.  Review of Systems  Positive:  Negative:   Physical Exam  BP (!) 165/61   Pulse 67   Temp 98.5 F (36.9 C) (Oral)   Resp 18   Ht '5\' 7"'$  (1.702 m)   Wt 74 kg   SpO2 96%   BMI 25.55 kg/m  Gen:   Awake, no distress   Resp:  Normal effort MSK:   Moves extremities without difficulty  Other:    Medical Decision Making  Medically screening exam initiated at 10:31 PM.  Appropriate orders placed.  [name] was informed that the remainder of the evaluation will be completed by another provider, this initial triage assessment does not replace that evaluation, and the importance of remaining in the ED until their evaluation is complete.     Nestor Lewandowsky 05/01/22 2233

## 2022-05-02 DIAGNOSIS — N186 End stage renal disease: Secondary | ICD-10-CM | POA: Diagnosis not present

## 2022-05-02 DIAGNOSIS — Z992 Dependence on renal dialysis: Secondary | ICD-10-CM | POA: Diagnosis not present

## 2022-05-02 MED ORDER — PREDNISONE 20 MG PO TABS: 40.0000 mg | ORAL_TABLET | Freq: Every day | ORAL | 0 refills | Status: AC

## 2022-05-02 MED ORDER — ALBUTEROL SULFATE HFA 108 (90 BASE) MCG/ACT IN AERS
2.0000 | INHALATION_SPRAY | Freq: Once | RESPIRATORY_TRACT | Status: AC
  Administered 2022-05-02: 2 via RESPIRATORY_TRACT
  Filled 2022-05-02: qty 6.7

## 2022-05-02 NOTE — Discharge Instructions (Signed)
Your history, exam, evaluation are consistent with a viral upper respiratory infection that is causing your wheezing and bronchospasm and shortness of breath.  Your oxygen was completely normal here as were the rest of your vital signs in regards to heart rate, breathing rate, and temperature.  Your x-ray did not show new pneumonia, just shows some fluid likely due to your dialysis.  You did not have significant edema otherwise and had a reassuring exam.  As you recently tested negative for COVID and flu we agreed together to not test you again however we did want to give you the puffs of albuterol and gentle burst of steroids for the next 5 days to help with the wheezing and breathing difficulties.  Please be careful and monitor your glucoses during this time and please continue her dialysis regimen.  We had a shared decision-making conversation and agreed to hold on more extensive work-up, lab testing, and imaging given her lack of chest pain or other symptoms.  Please follow-up with your primary doctor.  If any symptoms change or worsen acutely, please return to the nearest emergency department.

## 2022-05-02 NOTE — ED Provider Notes (Signed)
[facility] EMERGENCY DEPARTMENT Provider Note   CSN: [id number] Arrival date & time: 05/01/22  2208     History  Chief Complaint  Patient presents with   Shortness of Breath   Nasal Congestion    [name] is a 81 y.o. female.  The history is provided by the patient and medical records. No language interpreter was used.  Shortness of Breath Severity:  Mild Onset quality:  Gradual Duration:  1 week Timing:  Intermittent Progression:  Waxing and waning Chronicity:  New Context: URI   Relieved by:  Nothing Worsened by:  Nothing Ineffective treatments:  None tried Associated symptoms: cough and wheezing   Associated symptoms: no abdominal pain, no chest pain, no diaphoresis, no fever, no headaches, no hemoptysis, no neck pain, no rash, no sputum production, no swollen glands and no vomiting   Risk factors: no hx of PE/DVT        Home Medications Prior to Admission medications   Medication Sig Start Date End Date Taking? Authorizing Provider  albuterol (VENTOLIN HFA) 108 (90 Base) MCG/ACT inhaler Inhale 2 puffs into the lungs every 6 (six) hours as needed for wheezing or shortness of breath. 11/21/20   Gerlene Fee, NP  aspirin 325 MG tablet Take 650 mg by mouth daily. 07/09/18   Rogene Houston, MD  BD INSULIN SYRINGE U/F 31G X 5/16" 0.3 ML MISC Inject 1 each into the skin 2 (two) times daily. as directed 11/21/20   Gerlene Fee, NP  cyanocobalamin (,VITAMIN B-12,) 1000 MCG/ML injection Inject 1,000 mcg into the muscle every 30 (thirty) days. 11/01/14   [provider]  diclofenac Sodium (VOLTAREN) 1 % GEL Apply 4 g topically 4 (four) times daily as needed. Apply to bilateral knees 03/16/21   Scot Jun, FNP  insulin NPH-regular Human (HUMULIN 70/30) (70-30) 100 UNIT/ML injection Inject 5-14 Units into the skin 2 (two) times daily with a meal. Patient taking differently: Inject 5-10 Units into the skin 2 (two) times daily with  a meal. Take 10 units in the morning and 5 units at bedtime 02/03/22   Cassandria Anger, MD  labetalol (NORMODYNE) 200 MG tablet Take 200 mg by mouth 2 (two) times daily. 07/26/21   [provider]  lidocaine-prilocaine (EMLA) cream Apply 1 application topically every Monday, Wednesday, and Friday. 11/21/20   Gerlene Fee, NP  multivitamin (RENA-VIT) TABS tablet Take 1 tablet by mouth at bedtime. 08/21/14   Kirsteins, Luanna Salk, MD  omeprazole (PRILOSEC) 40 MG capsule TAKE ONE CAPSULE BY MOUTH TWICE DAILY BEFORE A meal 04/16/22   Mansouraty, Telford Nab., MD  pregabalin (LYRICA) 75 MG capsule Take 1 capsule (75 mg total) by mouth daily. Patient not taking: Reported on 04/15/2022 12/10/21   Suzan Slick, NP  sevelamer carbonate (RENVELA) 800 MG tablet Take 1 tablet (800 mg total) by mouth 2 (two) times daily before lunch and supper. 11/21/20   Gerlene Fee, NP  traMADol (ULTRAM) 50 MG tablet Take 1 tablet (50 mg total) by mouth every 8 (eight) hours as needed for moderate pain or severe pain. Patient not taking: Reported on 04/15/2022 11/15/21   Suzan Slick, NP      Allergies    Ambien [zolpidem] and Reglan [metoclopramide]    Review of Systems   Review of Systems  Constitutional:  Negative for chills, diaphoresis, fatigue and fever.  HENT:  Positive for congestion.   Respiratory:  Positive for cough, chest tightness,  shortness of breath and wheezing. Negative for hemoptysis, sputum production and stridor.   Cardiovascular:  Negative for chest pain, palpitations and leg swelling.  Gastrointestinal:  Negative for abdominal pain, constipation, diarrhea, nausea and vomiting.  Genitourinary:  Negative for dysuria and flank pain.  Musculoskeletal:  Negative for back pain, neck pain and neck stiffness.  Skin:  Negative for rash and wound.  Neurological:  Negative for light-headedness, numbness and headaches.  Psychiatric/Behavioral:  Negative for agitation and confusion.   All other  systems reviewed and are negative.   Physical Exam Updated Vital Signs BP (!) 178/76   Pulse 72   Temp 97.9 F (36.6 C) (Oral)   Resp 15   Ht '5\' 7"'$  (1.702 m)   Wt 74 kg   SpO2 100%   BMI 25.55 kg/m  Physical Exam Vitals and nursing note reviewed.  Constitutional:      General: She is not in acute distress.    Appearance: She is well-developed. She is not ill-appearing, toxic-appearing or diaphoretic.  HENT:     Head: Normocephalic and atraumatic.     Mouth/Throat:     Mouth: Mucous membranes are moist.  Eyes:     Conjunctiva/sclera: Conjunctivae normal.     Pupils: Pupils are equal, round, and reactive to light.  Cardiovascular:     Rate and Rhythm: Normal rate and regular rhythm.     Heart sounds: No murmur heard. Pulmonary:     Effort: Pulmonary effort is normal. No tachypnea, bradypnea or respiratory distress.     Breath sounds: Wheezing present. No rhonchi or rales.  Chest:     Chest wall: No tenderness.  Abdominal:     Palpations: Abdomen is soft.     Tenderness: There is no abdominal tenderness.  Musculoskeletal:        General: No swelling.     Cervical back: Neck supple.  Skin:    General: Skin is warm and dry.     Capillary Refill: Capillary refill takes less than 2 seconds.  Neurological:     Mental Status: She is alert.  Psychiatric:        Mood and Affect: Mood normal. Mood is not anxious.     ED Results / Procedures / Treatments   Labs (all labs ordered are listed, but only abnormal results are displayed) Labs Reviewed  CBC WITH DIFFERENTIAL/PLATELET - Abnormal; Notable for the following components:      Result Value   RBC 3.31 (*)    Hemoglobin 10.3 (*)    HCT 32.0 (*)    Platelets 73 (*)    All other components within normal limits  COMPREHENSIVE METABOLIC PANEL - Abnormal; Notable for the following components:   Glucose, Bld 275 (*)    BUN 27 (*)    Creatinine, Ser 7.01 (*)    Calcium 8.1 (*)    Albumin 3.3 (*)    GFR, Estimated 5  (*)    All other components within normal limits    EKG EKG Interpretation  Date/Time:  Thursday May 01 2022 22:19:26 EST Ventricular Rate:  66 PR Interval:  158 QRS Duration: 76 QT Interval:  452 QTC Calculation: 473 R Axis:   114 Text Interpretation: Normal sinus rhythm Right axis deviation Anterior infarct , age undetermined Interpretation limited secondary to artifact Abnormal ECG Confirmed by Ripley Fraise [id number]) on 05/02/2022 6:21:28 AM  Radiology DG Chest 2 View  Result Date: 05/01/2022 CLINICAL DATA:  Cough EXAM: CHEST - 2 VIEW COMPARISON:  09/10/2021 FINDINGS:  Cardiomegaly with vascular congestion, mild interstitial edema and small pleural effusions. No focal consolidation or pneumothorax. Electronic recording device over the left chest. IMPRESSION: Cardiomegaly with vascular congestion, mild interstitial edema and small pleural effusions. Electronically Signed   By: Donavan Foil M.D.   On: 05/01/2022 22:51    Procedures Procedures    Medications Ordered in ED Medications  albuterol (VENTOLIN HFA) 108 (90 Base) MCG/ACT inhaler 2 puff (2 puffs Inhalation Given 05/02/22 3762)    ED Course/ Medical Decision Making/ A&P                           Medical Decision Making   ARYANI DAFFERN is a 81 y.o. female with a past medical history significant for hyperlipidemia, hypertension, diabetes, previous stroke, ESRD on dialysis MWF, thrombocytopenia, anemia, previous GI bleeding with gastric varices banding who presents with 1 week of congestion, dry cough, and some wheezing.  According to patient and family, she had a negative COVID and flu test several days ago and was given Ladona Ridgel that not seem to help.  She has had sick family numbers who have had similar cold symptoms.  She has not had any chest pain or chest tightness and denies any abdominal pain, nausea, vomiting, constipation, diarrhea, or other changes.  She has not missed any dialysis sessions and  she gets it Monday, Wednesday, and Friday.  She is due for this morning.  She has been here for 9 hours and 15 minutes in the emergency department prior to my initial evaluation.  On my exam, lungs do have wheezing but there was no rhonchi or rales on auscultation.  Her chest was nontender and abdomen was nontender.  Patient resting comfortably without distress.  Oxygen saturations 100% on room air and she is not tachypneic or tachycardic.  Patient is smiling and telling me she wants to leave the hospital.  Patient had some work-up in triage including an x-ray and some basic labs.  Chest x-ray does not show evidence of consolidation or pneumonia but does show some pleural effusions and some mild edema.  Her auscultation had more wheezing than rales.  Her CBC and metabolic panel shows no leukocytosis and similar anemia to prior.  The metabolic panel showed expected elevated creatinine given that she has not yet had dialysis today but potassium was normal.  Due to her shortness of breath we did offer further work-up and even repeat COVID/flu testing given it in the community however they did not want to be retested.  As her vital signs are reassuring they would rather get several puffs of albuterol and a burst of steroids and go home so she can go to dialysis today.  We went over extremely strict return precautions for new or worsened symptoms and agree with her management plan.  They will continue to monitor her glucoses closely as she is a diabetic on steroids and they still want to go home.  Patient be discharged for outpatient follow-up to get her dialysis today and continue her management of wheezing in the setting of likely viral URI.        Final Clinical Impression(s) / ED Diagnoses Final diagnoses:  Upper respiratory tract infection, unspecified type  Nasal congestion  Shortness of breath  Wheezing    Rx / DC Orders ED Discharge Orders          Ordered    predniSONE (DELTASONE) 20  MG tablet  Daily  05/02/22 0729            Clinical Impression: 1. Upper respiratory tract infection, unspecified type   2. Nasal congestion   3. Shortness of breath   4. Wheezing     Disposition: Discharge  Condition: Good  I have discussed the results, Dx and Tx plan with the pt(& family if present). He/she/they expressed understanding and agree(s) with the plan. Discharge instructions discussed at great length. Strict return precautions discussed and pt &/or family have verbalized understanding of the instructions. No further questions at time of discharge.    New Prescriptions   PREDNISONE (DELTASONE) 20 MG TABLET    Take 2 tablets (40 mg total) by mouth daily for 5 days.    Follow Up: [name], MD [address] [place] [address] [phone]         Joellen Tullos, Gwenyth Allegra, MD 05/02/22 [phone]

## 2022-05-05 ENCOUNTER — Ambulatory Visit (INDEPENDENT_AMBULATORY_CARE_PROVIDER_SITE_OTHER): Payer: Medicare Other

## 2022-05-05 ENCOUNTER — Ambulatory Visit
Admission: EM | Admit: 2022-05-05 | Discharge: 2022-05-05 | Disposition: A | Discharge status: Home / Self Care | Payer: Medicare Other | Attending: Nurse Practitioner | Admitting: Nurse Practitioner

## 2022-05-05 DIAGNOSIS — R062 Wheezing: Secondary | ICD-10-CM

## 2022-05-05 DIAGNOSIS — J811 Chronic pulmonary edema: Secondary | ICD-10-CM | POA: Diagnosis not present

## 2022-05-05 DIAGNOSIS — J441 Chronic obstructive pulmonary disease with (acute) exacerbation: Secondary | ICD-10-CM | POA: Diagnosis not present

## 2022-05-05 DIAGNOSIS — J069 Acute upper respiratory infection, unspecified: Secondary | ICD-10-CM

## 2022-05-05 DIAGNOSIS — R059 Cough, unspecified: Secondary | ICD-10-CM | POA: Diagnosis not present

## 2022-05-05 DIAGNOSIS — Z992 Dependence on renal dialysis: Secondary | ICD-10-CM | POA: Diagnosis not present

## 2022-05-05 DIAGNOSIS — N186 End stage renal disease: Secondary | ICD-10-CM | POA: Diagnosis not present

## 2022-05-05 MED ORDER — IPRATROPIUM-ALBUTEROL 0.5-2.5 (3) MG/3ML IN SOLN
3.0000 mL | Freq: Once | RESPIRATORY_TRACT | Status: AC
  Administered 2022-05-05: 3 mL via RESPIRATORY_TRACT

## 2022-05-05 MED ORDER — METHYLPREDNISOLONE SODIUM SUCC 125 MG IJ SOLR
60.0000 mg | Freq: Once | INTRAMUSCULAR | Status: AC
  Administered 2022-05-05: 60 mg via INTRAMUSCULAR

## 2022-05-05 NOTE — Discharge Instructions (Signed)
The chest x-ray today does not show pneumonia.  We have given a breathing treatment for wheezing and shot of steroid to help with lung inflammation.  Continue the breathing treatments at home every 4-6 hours and continue the prednisone starting tomorrow.    In addition, start plain guaifenesin to help break up the congestion.   Follow up with PCP in 48 hours.  If she gets worse, take her back to the ER or call 911.

## 2022-05-05 NOTE — ED Triage Notes (Signed)
Patient presents to UC for SOB, cough  x 7 days. Daughter states she was seen in another UC and the ED with no abnormal findings. Negative for flu, COVID, and strep. Chest xray normal. Daughter states they prescribed her tessalon med with no improvement, has also been using her inhaler that has not helped.   Denies fever.

## 2022-05-05 NOTE — ED Provider Notes (Signed)
[facility]    CSN: [id number] Arrival date & time: 05/05/22  1041      History   Chief Complaint Chief Complaint  Patient presents with   Cough   Shortness of Breath    HPI [name] is a 81 y.o. female.   Patient presents with daughter for approximately 1 to 2 weeks of congested cough, shortness of breath and wheezing, chest pain when she coughs, chest tightness, chest congestion, headache, decreased appetite, and fatigue.  She denies fever, nasal congestion, runny nose, postnasal drainage, sore throat, ear pain, abdominal pain, nausea/vomiting, diarrhea, loss of taste or smell.  Daughter reports she has been seen multiple times since she has been sick and "nothing has helped."  Patient is currently taking prednisone for wheezing without much benefit.  Has been sporadically using albuterol inhaler at home.  Daughter is very concerned about the cough and wants something to stop the coughing.  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    Past Medical History:  Diagnosis Date   Anemia    Cataract    Chronic kidney disease    CVA (cerebral infarction)    Diabetes mellitus with ESRD (end-stage renal disease) ([place])    Type 2   Dialysis patient ([place])    M, W, F   Fistula    R arm   GERD (gastroesophageal reflux disease)    Hypertension    Renal disorder    Shortness of breath    Stroke [facility])    right side weakness    Patient Active Problem List   Diagnosis Date Noted   Spinal stenosis of lumbar region 01/07/2022   Schatzki's ring 10/15/2021   Esophageal dysphagia 10/15/2021   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   Hypertensive heart and CKD, 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/2020   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 due to type 2 diabetes mellitus ([place]) 11/09/2020   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  diabetes mellitus ([place]) 11/09/2020   Dependence on renal dialysis ([place]) 11/09/2020   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 disease ([place]) 11/09/2020   Hyperlipidemia associated with type 2 diabetes mellitus ([place]) 11/09/2020   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 ([place]) 11/09/2020   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 ([place]) 11/09/2020   Chronic constipation 11/09/2020   Vitamin B 12 deficiency 11/09/2020   Acute respiratory failure with hypoxia ([place]) 10/26/2020   Elevated troponin 10/26/2020   Volume overload 10/26/2020   Hypertensive urgency 10/26/2020   History of CVA (cerebrovascular accident) 01/05/2020   Thrombocytopenia ([place]) 01/05/2020   History of colonic polyps 04/01/2018   Acute bronchitis 10/15/2017   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 ([place])    HCAP (healthcare-associated pneumonia) 05/29/2015   Hyperkalemia 05/29/2015   ESRD (end stage renal disease) on dialysis ([place])    Gastro-esophageal reflux disease without esophagitis 09/29/2014   Encounter for adequacy testing for hemodialysis ([place]) 01/17/2014   Mechanical complication of other vascular device, implant, and graft 01/17/2014   Disturbances of vision, late effect of stroke 11/03/2013   Right hemiparesis ([place]) 08/19/2013   CVA (cerebral infarction) 07/20/2013   Gastroparesis due to DM ([place]) 07/01/2013   Type 2 diabetes mellitus with hyperglycemia ([place]) 06/23/2013   Fall 05/25/2012   HLD (hyperlipidemia) 05/20/2006   Hereditary and idiopathic peripheral neuropathy 05/20/2006  Hypertension associated with diabetes ([place]) 05/20/2006    Past Surgical History:  Procedure Laterality Date   AMPUTATION Right 01/13/2020   Procedure: AMPUTATION RIGHT FOOT 1ST RAY;  Surgeon: Newt Minion, MD;  Location: [place];  Service: Orthopedics;  Laterality: Right;   AV FISTULA PLACEMENT Right 09/08/2013   Procedure: CREATION OF RIGHT BRACHIAL CEPHALIC ARTERIOVENOUS  FISTULA ;  Surgeon: Mal Misty, MD;  Location: [place];  Service: Vascular;  Laterality: Right;   [place] Right 01/26/2014   Procedure: Right Arm BASILIC VEIN TRANSPOSITION;  Surgeon: Mal Misty, MD;  Location: [place];  Service: Vascular;  Laterality: Right;   BIOPSY  09/10/2021   Procedure: BIOPSY;  Surgeon: Harvel Quale, MD;  [facility];  Service: Gastroenterology;;  gastric   BIOPSY  10/07/2021   Procedure: BIOPSY;  Surgeon: Irving Copas., MD;  [facility];  Service: Gastroenterology;;   BIOPSY  04/17/2022   Procedure: BIOPSY;  Surgeon: Irving Copas., MD;  Location: [facility];  Service: Gastroenterology;;   CATARACT EXTRACTION W/PHACO  11/20/2011   Procedure: CATARACT EXTRACTION PHACO AND INTRAOCULAR LENS PLACEMENT ([place]);  Surgeon: Tonny Branch, MD;  [facility];  Service: Ophthalmology;  Laterality: Right;  CDE 18.82   CATARACT EXTRACTION W/PHACO Left 11/18/2012   Procedure: CATARACT EXTRACTION PHACO AND INTRAOCULAR LENS PLACEMENT (IOC);  Surgeon: Tonny Branch, MD;  [facility]: [facility];  Service: Ophthalmology;  Laterality: Left;  CDE: 18.97   COLONOSCOPY N/A 02/09/2013   Procedure: COLONOSCOPY;  Surgeon: Rogene [place], MD;  [facility];  Service: Endoscopy;  Laterality: N/A;  305-moved to 220 Ann to notify pt   COLONOSCOPY N/A 07/08/2018   Procedure: COLONOSCOPY;  Surgeon: Rogene [place], MD;  [facility];  Service: Endoscopy;  Laterality: N/A;  930   ENDOSCOPIC MUCOSAL RESECTION N/A 10/07/2021   Procedure: ENDOSCOPIC MUCOSAL RESECTION;  Surgeon: Rush Landmark Telford Nab., MD;  [facility];  Service: Gastroenterology;  Laterality: N/A;   ESOPHAGOGASTRODUODENOSCOPY N/A 04/17/2022   Procedure: ESOPHAGOGASTRODUODENOSCOPY (EGD);  Surgeon: Irving Copas., MD;  [facility];  Service: Gastroenterology;  Laterality: N/A;   ESOPHAGOGASTRODUODENOSCOPY (EGD) WITH PROPOFOL N/A  09/10/2021   Procedure: ESOPHAGOGASTRODUODENOSCOPY (EGD) WITH PROPOFOL;  Surgeon: Harvel Quale, MD;  [facility];  Service: Gastroenterology;  Laterality: N/A;   ESOPHAGOGASTRODUODENOSCOPY (EGD) WITH PROPOFOL N/A 10/07/2021   Procedure: ESOPHAGOGASTRODUODENOSCOPY (EGD) WITH PROPOFOL;  Surgeon: Rush Landmark Telford Nab., MD;  [facility];  Service: Gastroenterology;  Laterality: N/A;   EUS N/A 10/07/2021   Procedure: UPPER ENDOSCOPIC ULTRASOUND (EUS) RADIAL;  Surgeon: Irving Copas., MD;  [facility];  Service: Gastroenterology;  Laterality: N/A;   GASTRIC VARICES BANDING  10/07/2021   Procedure: duodenal tumor BANDING;  Surgeon: Rush Landmark Telford Nab., MD;  [facility];  Service: Gastroenterology;;  with emr kit   HEMOSTASIS CLIP PLACEMENT  10/07/2021   Procedure: HEMOSTASIS CLIP PLACEMENT;  Surgeon: Irving Copas., MD;  [facility];  Service: Gastroenterology;;   INSERTION OF DIALYSIS CATHETER Right 06/24/2013   Procedure: INSERTION OF DIALYSIS CATHETER: Ultrasound guided;  Surgeon: Serafina Mitchell, MD;  Location: [place];  Service: Vascular;  Laterality: Right;   LIGATION OF ARTERIOVENOUS  FISTULA Right 01/26/2014   Procedure: LIGATION OF ARTERIOVENOUS  FISTULA;  Surgeon: Mal Misty, MD;  Location: [place];  Service: Vascular;  Laterality: Right;   LOOP RECORDER IMPLANT N/A 07/21/2013   Procedure: LOOP RECORDER IMPLANT;  Surgeon: Carleene Overlie  Peyton Najjar, MD;  Location: [facility];  Service: Cardiovascular;  Laterality: N/A;   POLYPECTOMY  07/08/2018   Procedure: POLYPECTOMY;  Surgeon: Rogene [place], MD;  [facility];  Service: Endoscopy;;  Descending colon polyps x 2    SUBMUCOSAL LIFTING INJECTION  10/07/2021   Procedure: SUBMUCOSAL LIFTING INJECTION;  Surgeon: Irving Copas., MD;  [facility];  Service: Gastroenterology;;   TEE WITHOUT CARDIOVERSION N/A 07/21/2013   Procedure: TRANSESOPHAGEAL  ECHOCARDIOGRAM (TEE);  Surgeon: Dorothy Spark, MD;  Location: [place];  Service: Cardiovascular;  Laterality: N/A;   UPPER ESOPHAGEAL ENDOSCOPIC ULTRASOUND (EUS) N/A 04/17/2022   Procedure: UPPER ESOPHAGEAL ENDOSCOPIC ULTRASOUND (EUS);  Surgeon: Irving Copas., MD;  Location[facility];  Service: Gastroenterology;  Laterality: N/A;    OB History     Gravida  0   Para  0   Term  0   Preterm  0   AB  0   Living         SAB  0   IAB  0   Ectopic  0   Multiple      Live Births               Home Medications    Prior to Admission medications   Medication Sig Start Date End Date Taking? Authorizing Provider  albuterol (VENTOLIN HFA) 108 (90 Base) MCG/ACT inhaler Inhale 2 puffs into the lungs every 6 (six) hours as needed for wheezing or shortness of breath. 11/21/20   Gerlene Fee, NP  aspirin 325 MG tablet Take 650 mg by mouth daily. 07/09/18   Rogene [place], MD  BD INSULIN SYRINGE U/F 31G X 5/16" 0.3 ML MISC Inject 1 each into the skin 2 (two) times daily. as directed 11/21/20   Gerlene Fee, NP  cyanocobalamin (,VITAMIN B-12,) 1000 MCG/ML injection Inject 1,000 mcg into the muscle every 30 (thirty) days. 11/01/14   [provider]  diclofenac Sodium (VOLTAREN) 1 % GEL Apply 4 g topically 4 (four) times daily as needed. Apply to bilateral knees 03/16/21   Scot Jun, FNP  insulin NPH-regular Human (HUMULIN 70/30) (70-30) 100 UNIT/ML injection Inject 5-14 Units into the skin 2 (two) times daily with a meal. Patient taking differently: Inject 5-10 Units into the skin 2 (two) times daily with a meal. Take 10 units in the morning and 5 units at bedtime 02/03/22   Cassandria Anger, MD  labetalol (NORMODYNE) 200 MG tablet Take 200 mg by mouth 2 (two) times daily. 07/26/21   [provider]  lidocaine-prilocaine (EMLA) cream Apply 1 application topically every Monday, Wednesday, and Friday. 11/21/20   Gerlene Fee, NP   multivitamin (RENA-VIT) TABS tablet Take 1 tablet by mouth at bedtime. 08/21/14   Kirsteins, Luanna Salk, MD  omeprazole (PRILOSEC) 40 MG capsule TAKE ONE CAPSULE BY MOUTH TWICE DAILY BEFORE A meal 04/16/22   Mansouraty, Telford Nab., MD  predniSONE (DELTASONE) 20 MG tablet Take 2 tablets (40 mg total) by mouth daily for 5 days. 05/02/22 05/07/22  Tegeler, Gwenyth Allegra, MD  pregabalin (LYRICA) 75 MG capsule Take 1 capsule (75 mg total) by mouth daily. Patient not taking: Reported on 04/15/2022 12/10/21   Suzan Slick, NP  sevelamer carbonate (RENVELA) 800 MG tablet Take 1 tablet (800 mg total) by mouth 2 (two) times daily before lunch and supper. 11/21/20   Gerlene Fee, NP  traMADol (ULTRAM) 50 MG tablet Take 1 tablet (50  mg total) by mouth every 8 (eight) hours as needed for moderate pain or severe pain. Patient not taking: Reported on 04/15/2022 11/15/21   Suzan Slick, NP    Family History Family History  Problem Relation Age of Onset   Cancer Sister    Cancer Brother    Anesthesia problems Neg Hx    Hypotension Neg Hx    Malignant hyperthermia Neg Hx    Pseudochol deficiency Neg Hx     Social History Social History   Tobacco Use   Smoking status: Never   Smokeless tobacco: Never  Vaping Use   Vaping Use: Never used  Substance Use Topics   Alcohol use: No   Drug use: No     Allergies   Ambien [zolpidem] and Reglan [metoclopramide]   Review of Systems Review of Systems Per HPI  Physical Exam Triage Vital Signs ED Triage Vitals  Enc Vitals Group     BP 05/05/22 1122 (!) 181/67     Pulse Rate 05/05/22 1122 77     Resp --      Temp 05/05/22 1122 98.1 F (36.7 C)     Temp Source 05/05/22 1122 Oral     SpO2 05/05/22 1122 95 %     Weight --      Height --      Head Circumference --      Peak Flow --      Pain Score 05/05/22 1125 8     Pain Loc --      Pain Edu? --      Excl. in [place]? --    No data found.  Updated Vital Signs BP (S) (!) 181/67 (BP Location:  Left Arm)   Pulse 77   Temp 98.1 F (36.7 C) (Oral)   Resp (!) 24   SpO2 98%   Visual Acuity Right Eye Distance:   Left Eye Distance:   Bilateral Distance:    Right Eye Near:   Left Eye Near:    Bilateral Near:     Physical Exam Vitals and nursing note reviewed.  Constitutional:      General: She is not in acute distress.    Appearance: Normal appearance. She is not ill-appearing or toxic-appearing.  HENT:     Head: Normocephalic and atraumatic.     Right Ear: External ear normal.     Left Ear: External ear normal.     Nose: No congestion or rhinorrhea.     Mouth/Throat:     Mouth: Mucous membranes are moist.     Pharynx: Oropharynx is clear.  Eyes:     General: No scleral icterus.    Extraocular Movements: Extraocular movements intact.  Cardiovascular:     Rate and Rhythm: Normal rate and regular rhythm.  Pulmonary:     Effort: Pulmonary effort is normal. No respiratory distress.     Breath sounds: Wheezing present. No rhonchi or rales.  Abdominal:     General: Abdomen is flat. Bowel sounds are normal. There is no distension.     Palpations: Abdomen is soft.     Tenderness: There is no abdominal tenderness.  Musculoskeletal:     Cervical back: Normal range of motion and neck supple.  Lymphadenopathy:     Cervical: No cervical adenopathy.  Skin:    General: Skin is warm and dry.     Coloration: Skin is not jaundiced or pale.     Findings: No erythema or rash.  Neurological:     Mental  Status: She is alert and oriented to person, place, and time.  Psychiatric:        Behavior: Behavior is cooperative.      UC Treatments / Results  Labs (all labs ordered are listed, but only abnormal results are displayed) Labs Reviewed - No data to display  EKG   Radiology DG Chest 2 View  Result Date: 05/05/2022 CLINICAL DATA:  Cough, wheezing. EXAM: CHEST - 2 VIEW COMPARISON:  May 01, 2022. FINDINGS: Stable cardiomegaly. Mild central pulmonary vascular  congestion is noted. Minimal bibasilar pulmonary edema is noted which is improved compared to prior exam. Bony thorax is unremarkable. IMPRESSION: Stable cardiomegaly and central pulmonary vascular congestion, but improved bilateral pulmonary edema. Electronically Signed   By: Marijo Conception M.D.   On: 05/05/2022 11:55    Procedures Procedures (including critical care time)  Medications Ordered in UC Medications  ipratropium-albuterol (DUONEB) 0.5-2.5 (3) MG/3ML nebulizer solution 3 mL (3 mLs Nebulization Given 05/05/22 1150)  methylPREDNISolone sodium succinate (SOLU-MEDROL) 125 mg/2 mL injection 60 mg (60 mg Intramuscular Given 05/05/22 1223)    Initial Impression / Assessment and Plan / UC Course  I have reviewed the triage vital signs and the nursing notes.  Pertinent labs & imaging results that were available during my care of the patient were reviewed by me and considered in my medical decision making (see chart for details).   Patient is well-appearing, afebrile, not tachycardic, oxygenating well on room air.  She is mildly hypertensive and slightly tachypneic, however and oxygenating well.  Patient is slightly slightly tachypneic secondary to wheezing.  She also came straight to urgent care from dialysis today.  Wheezing Viral URI with cough Suspect ongoing wheezing from upper respiratory infection Breathing treatment given in urgent care today with improvement in wheezing, improvement in oxygenation Hold prednisone today, will give injection of Solu-Medrol 60 mg-continue prednisone while tomorrow Chest x-ray today is negative for acute cardiopulmonary process Discussed with daughter at length that I advise against suppressing congested cough Recommended close follow-up with PCP within the next 48 hours Strict ER precautions discussed  The patient was given the opportunity to ask questions.  All questions answered to their satisfaction.  The patient is in agreement to this plan.     Final Clinical Impressions(s) / UC Diagnoses   Final diagnoses:  Wheezing  Viral URI with cough     Discharge Instructions      The chest x-ray today does not show pneumonia.  We have given a breathing treatment for wheezing and shot of steroid to help with lung inflammation.  Continue the breathing treatments at home every 4-6 hours and continue the prednisone starting tomorrow.    In addition, start plain guaifenesin to help break up the congestion.   Follow up with PCP in 48 hours.  If she gets worse, take her back to the ER or call 911.     ED Prescriptions   None    PDMP not reviewed this encounter.   Eulogio Bear, NP 05/05/22 1744

## 2022-05-07 DIAGNOSIS — N186 End stage renal disease: Secondary | ICD-10-CM | POA: Diagnosis not present

## 2022-05-07 DIAGNOSIS — Z992 Dependence on renal dialysis: Secondary | ICD-10-CM | POA: Diagnosis not present

## 2022-05-09 ENCOUNTER — Emergency Department (HOSPITAL_COMMUNITY)
Admission: EM | Admit: 2022-05-09 | Discharge: 2022-05-09 | Disposition: A | Discharge status: Home / Self Care | Payer: Medicare Other | Attending: Emergency Medicine | Admitting: Emergency Medicine

## 2022-05-09 ENCOUNTER — Encounter (HOSPITAL_COMMUNITY): Payer: Self-pay | Admitting: Emergency Medicine

## 2022-05-09 ENCOUNTER — Other Ambulatory Visit: Payer: Self-pay

## 2022-05-09 ENCOUNTER — Emergency Department (HOSPITAL_COMMUNITY): Payer: Medicare Other

## 2022-05-09 DIAGNOSIS — J40 Bronchitis, not specified as acute or chronic: Secondary | ICD-10-CM | POA: Diagnosis not present

## 2022-05-09 DIAGNOSIS — Z7982 Long term (current) use of aspirin: Secondary | ICD-10-CM | POA: Insufficient documentation

## 2022-05-09 DIAGNOSIS — I110 Hypertensive heart disease with heart failure: Secondary | ICD-10-CM | POA: Diagnosis not present

## 2022-05-09 DIAGNOSIS — R0602 Shortness of breath: Secondary | ICD-10-CM | POA: Insufficient documentation

## 2022-05-09 DIAGNOSIS — Z794 Long term (current) use of insulin: Secondary | ICD-10-CM | POA: Diagnosis not present

## 2022-05-09 DIAGNOSIS — Z1152 Encounter for screening for COVID-19: Secondary | ICD-10-CM | POA: Diagnosis not present

## 2022-05-09 DIAGNOSIS — J811 Chronic pulmonary edema: Secondary | ICD-10-CM | POA: Diagnosis not present

## 2022-05-09 DIAGNOSIS — R6889 Other general symptoms and signs: Secondary | ICD-10-CM | POA: Diagnosis not present

## 2022-05-09 DIAGNOSIS — Z743 Need for continuous supervision: Secondary | ICD-10-CM | POA: Diagnosis not present

## 2022-05-09 DIAGNOSIS — I509 Heart failure, unspecified: Secondary | ICD-10-CM | POA: Insufficient documentation

## 2022-05-09 DIAGNOSIS — R52 Pain, unspecified: Secondary | ICD-10-CM | POA: Diagnosis not present

## 2022-05-09 DIAGNOSIS — I5020 Unspecified systolic (congestive) heart failure: Secondary | ICD-10-CM

## 2022-05-09 DIAGNOSIS — Z992 Dependence on renal dialysis: Secondary | ICD-10-CM | POA: Diagnosis not present

## 2022-05-09 LAB — CBC WITH DIFFERENTIAL/PLATELET
Abs Immature Granulocytes: 0.13 10*3/uL — ABNORMAL HIGH (ref 0.00–0.07)
Basophils Absolute: 0 10*3/uL (ref 0.0–0.1)
Basophils Relative: 0 %
Eosinophils Absolute: 0.2 10*3/uL (ref 0.0–0.5)
Eosinophils Relative: 2 %
HCT: 32.5 % — ABNORMAL LOW (ref 36.0–46.0)
Hemoglobin: 10.7 g/dL — ABNORMAL LOW (ref 12.0–15.0)
Immature Granulocytes: 1 %
Lymphocytes Relative: 6 %
Lymphs Abs: 0.8 10*3/uL (ref 0.7–4.0)
MCH: 31.2 pg (ref 26.0–34.0)
MCHC: 32.9 g/dL (ref 30.0–36.0)
MCV: 94.8 fL (ref 80.0–100.0)
Monocytes Absolute: 1 10*3/uL (ref 0.1–1.0)
Monocytes Relative: 8 %
Neutro Abs: 10.3 10*3/uL — ABNORMAL HIGH (ref 1.7–7.7)
Neutrophils Relative %: 83 %
Platelets: 71 10*3/uL — ABNORMAL LOW (ref 150–400)
RBC: 3.43 MIL/uL — ABNORMAL LOW (ref 3.87–5.11)
RDW: 15.1 % (ref 11.5–15.5)
Smear Review: DECREASED
WBC: 12.4 10*3/uL — ABNORMAL HIGH (ref 4.0–10.5)
nRBC: 0 % (ref 0.0–0.2)

## 2022-05-09 LAB — BASIC METABOLIC PANEL
Anion gap: 15 (ref 5–15)
BUN: 38 mg/dL — ABNORMAL HIGH (ref 8–23)
CO2: 27 mmol/L (ref 22–32)
Calcium: 7.6 mg/dL — ABNORMAL LOW (ref 8.9–10.3)
Chloride: 96 mmol/L — ABNORMAL LOW (ref 98–111)
Creatinine, Ser: 6.7 mg/dL — ABNORMAL HIGH (ref 0.44–1.00)
GFR, Estimated: 6 mL/min — ABNORMAL LOW (ref >60)
Glucose, Bld: 371 mg/dL — ABNORMAL HIGH (ref 70–99)
Potassium: 3.5 mmol/L (ref 3.5–5.1)
Sodium: 138 mmol/L (ref 135–145)

## 2022-05-09 LAB — RESP PANEL BY RT-PCR (FLU A&B, COVID) ARPGX2
Influenza A by PCR: NEGATIVE
Influenza B by PCR: NEGATIVE
SARS Coronavirus 2 by RT PCR: NEGATIVE

## 2022-05-09 LAB — BRAIN NATRIURETIC PEPTIDE: B Natriuretic Peptide: 2370 pg/mL — ABNORMAL HIGH (ref 0.0–100.0)

## 2022-05-09 LAB — HEPATITIS B SURFACE ANTIGEN: Hepatitis B Surface Ag: NONREACTIVE

## 2022-05-09 MED ORDER — LIDOCAINE HCL (PF) 1 % IJ SOLN: 5.0000 mL | INTRAMUSCULAR | Status: DC | PRN

## 2022-05-09 MED ORDER — DOXYCYCLINE HYCLATE 100 MG PO CAPS: 100.0000 mg | ORAL_CAPSULE | Freq: Two times a day (BID) | ORAL | 0 refills | Status: DC

## 2022-05-09 MED ORDER — CHLORHEXIDINE GLUCONATE CLOTH 2 % EX PADS: 6.0000 | MEDICATED_PAD | Freq: Every day | CUTANEOUS | Status: DC

## 2022-05-09 MED ORDER — LIDOCAINE-PRILOCAINE 2.5-2.5 % EX CREA: 1.0000 | TOPICAL_CREAM | CUTANEOUS | Status: DC | PRN

## 2022-05-09 MED ORDER — GUAIFENESIN-DM 100-10 MG/5ML PO SYRP
10.0000 mL | ORAL_SOLUTION | Freq: Once | ORAL | Status: AC
  Administered 2022-05-09: 10 mL via ORAL
  Filled 2022-05-09: qty 10

## 2022-05-09 MED ORDER — PENTAFLUOROPROP-TETRAFLUOROETH EX AERO: 1.0000 | INHALATION_SPRAY | CUTANEOUS | Status: DC | PRN

## 2022-05-09 NOTE — Procedures (Signed)
    HEMODIALYSIS TREATMENT NOTE:   3.75 hour treatment completed using right upper arm AVF (15g/antegrade) -- first hour in isolated UF, remainder of treatment in HD.  Treatment was prolonged due to problems with Tablo device; cartridge and dialyzer were changed twice for a wet transducer.   Fluid removal was limited due to severe leg and feet cramps.  UF was paused twice and goal was lowered twice in response to cramps unrelieved by NS bolus.  Order was obtained for Robitussin for pt's constant, non-productive cough.  Net UF 4.1 liters.  All blood was returned and hemostasis was achieved in 20 minutes.  Post HD vitals:  t 98.2,  167/70 (standing),  p79,  r20,  spO2 100% on room air. Post-weight 72.9 kg (standing scale).  Pt was transported back to ED to await disposition.  Hand-off given to Terance Hart, EMT-P.   Rockwell Alexandria, RN

## 2022-05-09 NOTE — ED Provider Notes (Signed)
[facility] EMERGENCY DEPARTMENT Provider Note   CSN: [id number] Arrival date & time: 05/09/22  7026     History  Chief Complaint  Patient presents with   Shortness of Breath    [name] is a 81 y.o. female.  Patient is a dialysis patient.  She complains of shortness of breath and wheezing.  She has been treated with albuterol and prednisone in the last week, But she still can plaints of wheezing.  She went to dialysis today and did not get her dialysis.  They sent her with to the emergency department for evaluation  The history is provided by the patient. No language interpreter was used.  Shortness of Breath Severity:  Moderate Onset quality:  Sudden Timing:  Constant Progression:  Worsening Chronicity:  New Context: activity   Relieved by:  Nothing Worsened by:  Nothing Ineffective treatments:  None tried Associated symptoms: no abdominal pain, no chest pain, no cough, no headaches and no rash        Home Medications Prior to Admission medications   Medication Sig Start Date End Date Taking? Authorizing Provider  doxycycline (VIBRAMYCIN) 100 MG capsule Take 1 capsule (100 mg total) by mouth 2 (two) times daily. One po bid x 7 days 05/09/22  Yes Milton Ferguson, MD  albuterol (VENTOLIN HFA) 108 (90 Base) MCG/ACT inhaler Inhale 2 puffs into the lungs every 6 (six) hours as needed for wheezing or shortness of breath. 11/21/20   Gerlene Fee, NP  aspirin 325 MG tablet Take 650 mg by mouth daily. 07/09/18   Rogene Houston, MD  BD INSULIN SYRINGE U/F 31G X 5/16" 0.3 ML MISC Inject 1 each into the skin 2 (two) times daily. as directed 11/21/20   Gerlene Fee, NP  cyanocobalamin (,VITAMIN B-12,) 1000 MCG/ML injection Inject 1,000 mcg into the muscle every 30 (thirty) days. 11/01/14   [provider]  diclofenac Sodium (VOLTAREN) 1 % GEL Apply 4 g topically 4 (four) times daily as needed. Apply to bilateral knees 03/16/21   Scot Jun, FNP  insulin  NPH-regular Human (HUMULIN 70/30) (70-30) 100 UNIT/ML injection Inject 5-14 Units into the skin 2 (two) times daily with a meal. Patient taking differently: Inject 5-10 Units into the skin 2 (two) times daily with a meal. Take 10 units in the morning and 5 units at bedtime 02/03/22   Cassandria Anger, MD  labetalol (NORMODYNE) 200 MG tablet Take 200 mg by mouth 2 (two) times daily. 07/26/21   [provider]  lidocaine-prilocaine (EMLA) cream Apply 1 application topically every Monday, Wednesday, and Friday. 11/21/20   Gerlene Fee, NP  multivitamin (RENA-VIT) TABS tablet Take 1 tablet by mouth at bedtime. 08/21/14   Kirsteins, Luanna Salk, MD  omeprazole (PRILOSEC) 40 MG capsule TAKE ONE CAPSULE BY MOUTH TWICE DAILY BEFORE A meal 04/16/22   Mansouraty, Telford Nab., MD  pregabalin (LYRICA) 75 MG capsule Take 1 capsule (75 mg total) by mouth daily. Patient not taking: Reported on 04/15/2022 12/10/21   Suzan Slick, NP  sevelamer carbonate (RENVELA) 800 MG tablet Take 1 tablet (800 mg total) by mouth 2 (two) times daily before lunch and supper. 11/21/20   Gerlene Fee, NP  traMADol (ULTRAM) 50 MG tablet Take 1 tablet (50 mg total) by mouth every 8 (eight) hours as needed for moderate pain or severe pain. Patient not taking: Reported on 04/15/2022 11/15/21   Suzan Slick, NP      Allergies  Ambien [zolpidem] and Reglan [metoclopramide]    Review of Systems   Review of Systems  Constitutional:  Negative for appetite change and fatigue.  HENT:  Negative for congestion, ear discharge and sinus pressure.   Eyes:  Negative for discharge.  Respiratory:  Positive for shortness of breath. Negative for cough.   Cardiovascular:  Negative for chest pain.  Gastrointestinal:  Negative for abdominal pain and diarrhea.  Genitourinary:  Negative for frequency and hematuria.  Musculoskeletal:  Negative for back pain.  Skin:  Negative for rash.  Neurological:  Negative for seizures and headaches.   Psychiatric/Behavioral:  Negative for hallucinations.     Physical Exam Updated Vital Signs BP (!) 168/78 (BP Location: Left Arm)   Pulse 80   Temp 97.9 F (36.6 C) (Oral)   Resp (!) 28   Ht '5\' 7"'$  (1.702 m)   Wt 74 kg   SpO2 99%   BMI 25.55 kg/m  Physical Exam Constitutional:      Appearance: She is well-developed.  HENT:     Head: Normocephalic.  Eyes:     General: No scleral icterus.    Conjunctiva/sclera: Conjunctivae normal.  Neck:     Thyroid: No thyromegaly.     Trachea: No tracheal deviation.  Cardiovascular:     Rate and Rhythm: Normal rate and regular rhythm.     Heart sounds: No murmur heard.    No friction rub. No gallop.  Pulmonary:     Breath sounds: No stridor. Wheezing present. No rales.  Chest:     Chest wall: No tenderness.  Abdominal:     General: There is no distension.     Tenderness: There is no abdominal tenderness. There is no rebound.  Musculoskeletal:        General: Normal range of motion.     Cervical back: Neck supple.  Lymphadenopathy:     Cervical: No cervical adenopathy.  Skin:    General: Skin is warm.     Findings: No erythema or rash.  Neurological:     Mental Status: She is oriented to person, place, and time.     Motor: No abnormal muscle tone.     Coordination: Coordination normal.  Psychiatric:        Behavior: Behavior normal.     ED Results / Procedures / Treatments   Labs (all labs ordered are listed, but only abnormal results are displayed) Labs Reviewed  CBC WITH DIFFERENTIAL/PLATELET - Abnormal; Notable for the following components:      Result Value   WBC 12.4 (*)    RBC 3.43 (*)    Hemoglobin 10.7 (*)    HCT 32.5 (*)    Platelets 71 (*)    Neutro Abs 10.3 (*)    Abs Immature Granulocytes 0.13 (*)    All other components within normal limits  BRAIN NATRIURETIC PEPTIDE - Abnormal; Notable for the following components:   B Natriuretic Peptide 2,370.0 (*)    All other components within normal limits   BASIC METABOLIC PANEL - Abnormal; Notable for the following components:   Chloride 96 (*)    Glucose, Bld 371 (*)    BUN 38 (*)    Creatinine, Ser 6.70 (*)    Calcium 7.6 (*)    GFR, Estimated 6 (*)    All other components within normal limits  RESP PANEL BY RT-PCR (FLU A&B, COVID) ARPGX2  HEPATITIS B SURFACE ANTIGEN  HEPATITIS B SURFACE ANTIBODY, QUANTITATIVE    EKG EKG Interpretation  Date/Time:  Friday May 09 2022 06:20:43 EST Ventricular Rate:  69 PR Interval:  140 QRS Duration: 79 QT Interval:  457 QTC Calculation: 490 R Axis:   44 Text Interpretation: Sinus rhythm Borderline repolarization abnormality Borderline prolonged QT interval Confirmed by Ripley Fraise [phone]) on 05/09/2022 6:27:33 AM  Radiology DG Chest Port 1 View  Result Date: 05/09/2022 CLINICAL DATA:  Shortness of breath. EXAM: PORTABLE CHEST 1 VIEW COMPARISON:  05/05/2022 FINDINGS: Unchanged cardiac enlargement. Pulmonary vascular congestion. No pleural effusion or edema identified. Visualized osseous structures appear intact. IMPRESSION: Cardiac enlargement and pulmonary vascular congestion. Electronically Signed   By: Kerby Moors M.D.   On: 05/09/2022 07:15    Procedures Procedures    Medications Ordered in ED Medications  Chlorhexidine Gluconate Cloth 2 % PADS 6 each (has no administration in time range)    ED Course/ Medical Decision Making/ A&P    CRITICAL CARE Performed by: Milton Ferguson Total critical care time: 40 minutes Critical care time was exclusive of separately billable procedures and treating other patients. Critical care was necessary to treat or prevent imminent or life-threatening deterioration. Critical care was time spent personally by me on the following activities: development of treatment plan with patient and/or surrogate as well as nursing, discussions with consultants, evaluation of patient's response to treatment, examination of patient, obtaining history  from patient or surrogate, ordering and performing treatments and interventions, ordering and review of laboratory studies, ordering and review of radiographic studies, pulse oximetry and re-evaluation of patient's condition.    patient has congestive heart failure and will get dialyzed today.  After the dialysis she will be discharged home and is given doxycycline for her persistent cough.  Patient will then follow-up with her PCP                         Medical Decision Making Risk OTC drugs. Prescription drug management.  This patient presents to the ED for concern of shortness of breath, this involves an extensive number of treatment options, and is a complaint that carries with it a high risk of complications and morbidity.  The differential diagnosis includes pneumonia, heart failure   Co morbidities that complicate the patient evaluation Dialysis patient    Additional history obtained:  Additional history obtained from patient External records from outside source obtained and reviewed including hospital record   Lab Tests:  I Ordered, and personally interpreted labs.  The pertinent results include: BMP shows 2370, creatinine 6.7   Imaging Studies ordered:  I ordered imaging studies including chest x-ray I independently visualized and interpreted imaging which showed vascular congestion I agree with the radiologist interpretation   Cardiac Monitoring: / EKG:  The patient was maintained on a cardiac monitor.  I personally viewed and interpreted the cardiac monitored which showed an underlying rhythm of: Sinus rhythm   Consultations Obtained:  I requested consultation with the neurology,  and discussed lab and imaging findings as well as pertinent plan - they recommend: Dialysis today   Problem List / ED Course / Critical interventions / Medication management  Shortness of breath and heart failure and dialysis patient I ordered medication including doxycycline  for bronchitis Reevaluation of the patient after these medicines showed that the patient stayed the same I have reviewed the patients home medicines and have made adjustments as needed   Social Determinants of Health:  Dialysis   Test / Admission - Considered:  None  Congestive heart failure and persistent bronchitis.  Patient having dialysis today and given doxycycline and will be discharged home        Final Clinical Impression(s) / ED Diagnoses Final diagnoses:  None    Rx / DC Orders ED Discharge Orders          Ordered    doxycycline (VIBRAMYCIN) 100 MG capsule  2 times daily        05/09/22 3567              Milton Ferguson, MD 05/10/22 1015

## 2022-05-09 NOTE — ED Triage Notes (Signed)
Pt BIB [facility] from dialysis. Pt has been dx with bronchitis last week. Pt got SOB this morning while waiting for dialysis. Pt placed on 2L [place] for comfort.   Pt has not taken any of her daily medications today, nor did she receive her dialysis treatment.

## 2022-05-09 NOTE — ED Notes (Signed)
Pt in dialysis

## 2022-05-09 NOTE — ED Provider Triage Note (Signed)
Emergency Medicine Provider Triage Evaluation Note  ANEYAH LORTZ , a 81 y.o. female  was evaluated in triage.  Pt complains of shortness of breath.  Review of Systems  Positive: Shortness of breath and cough Negative: Chest pain  Physical Exam  BP (!) 189/55   Pulse 68   Temp 98.4 F (36.9 C) (Axillary)   Resp 15   Ht 1.702 m ('5\' 7"'$ )   Wt 74 kg   SpO2 100%   BMI 25.55 kg/m  Gen:   Awake, no distress   Resp:  Mild tachypnea, coarse breath sounds bilaterally MSK:   Moves extremities without difficulty  Other:  Dialysis access to right upper arm with thrill noted  Medical Decision Making  Medically screening exam initiated at 6:40 AM.  Appropriate orders placed.  MAKYNLEIGH BRESLIN was informed that the remainder of the evaluation will be completed by another provider, this initial triage assessment does not replace that evaluation, and the importance of remaining in the ED until their evaluation is complete.     Ripley Fraise, MD 05/09/22 [phone]

## 2022-05-09 NOTE — ED Notes (Signed)
X-ray at bedside

## 2022-05-09 NOTE — Discharge Instructions (Addendum)
After you get dialysis today you can be discharged home to follow-up with your primary care doctor.  You have been given some doxycycline to help clear up this persistent bronchitis

## 2022-05-09 NOTE — ED Notes (Signed)
Pt has returned from dialysis.

## 2022-05-09 NOTE — Progress Notes (Signed)
I was called about this ESRD patient this AM-  has been seen 2 times ( on 11/16 and 11/20) for sob, cough, congestion with each CXR showing only volume overload.  She was sent over from dialysis today without receiving treatment for same.  BP is high.  Will attempt an HD treatment with maximum UF to see if she will be symptomatically improved enough to go home after   Louis Meckel

## 2022-05-10 LAB — HEPATITIS B SURFACE ANTIBODY, QUANTITATIVE: Hep B S AB Quant (Post): 20.1 m[IU]/mL (ref >9.9)

## 2022-05-12 DIAGNOSIS — Z992 Dependence on renal dialysis: Secondary | ICD-10-CM | POA: Diagnosis not present

## 2022-05-12 DIAGNOSIS — N186 End stage renal disease: Secondary | ICD-10-CM | POA: Diagnosis not present

## 2022-05-13 DIAGNOSIS — D510 Vitamin B12 deficiency anemia due to intrinsic factor deficiency: Secondary | ICD-10-CM | POA: Diagnosis not present

## 2022-05-13 DIAGNOSIS — E1122 Type 2 diabetes mellitus with diabetic chronic kidney disease: Secondary | ICD-10-CM | POA: Diagnosis not present

## 2022-05-13 DIAGNOSIS — J219 Acute bronchiolitis, unspecified: Secondary | ICD-10-CM | POA: Diagnosis not present

## 2022-05-13 DIAGNOSIS — Z89411 Acquired absence of right great toe: Secondary | ICD-10-CM | POA: Diagnosis not present

## 2022-05-13 DIAGNOSIS — Z992 Dependence on renal dialysis: Secondary | ICD-10-CM | POA: Diagnosis not present

## 2022-05-13 DIAGNOSIS — N186 End stage renal disease: Secondary | ICD-10-CM | POA: Diagnosis not present

## 2022-05-13 DIAGNOSIS — D631 Anemia in chronic kidney disease: Secondary | ICD-10-CM | POA: Diagnosis not present

## 2022-05-14 DIAGNOSIS — N186 End stage renal disease: Secondary | ICD-10-CM | POA: Diagnosis not present

## 2022-05-14 DIAGNOSIS — Z992 Dependence on renal dialysis: Secondary | ICD-10-CM | POA: Diagnosis not present

## 2022-05-15 DIAGNOSIS — Z992 Dependence on renal dialysis: Secondary | ICD-10-CM | POA: Diagnosis not present

## 2022-05-15 DIAGNOSIS — N186 End stage renal disease: Secondary | ICD-10-CM | POA: Diagnosis not present

## 2022-05-15 DIAGNOSIS — I120 Hypertensive chronic kidney disease with stage 5 chronic kidney disease or end stage renal disease: Secondary | ICD-10-CM | POA: Diagnosis not present

## 2022-05-15 DIAGNOSIS — E1122 Type 2 diabetes mellitus with diabetic chronic kidney disease: Secondary | ICD-10-CM | POA: Diagnosis not present

## 2022-05-16 DIAGNOSIS — N186 End stage renal disease: Secondary | ICD-10-CM | POA: Diagnosis not present

## 2022-05-16 DIAGNOSIS — Z992 Dependence on renal dialysis: Secondary | ICD-10-CM | POA: Diagnosis not present

## 2022-05-17 DIAGNOSIS — Z794 Long term (current) use of insulin: Secondary | ICD-10-CM | POA: Diagnosis not present

## 2022-05-17 DIAGNOSIS — E1122 Type 2 diabetes mellitus with diabetic chronic kidney disease: Secondary | ICD-10-CM | POA: Diagnosis not present

## 2022-05-19 DIAGNOSIS — N186 End stage renal disease: Secondary | ICD-10-CM | POA: Diagnosis not present

## 2022-05-19 DIAGNOSIS — Z992 Dependence on renal dialysis: Secondary | ICD-10-CM | POA: Diagnosis not present

## 2022-05-21 DIAGNOSIS — N186 End stage renal disease: Secondary | ICD-10-CM | POA: Diagnosis not present

## 2022-05-21 DIAGNOSIS — Z992 Dependence on renal dialysis: Secondary | ICD-10-CM | POA: Diagnosis not present

## 2022-05-23 DIAGNOSIS — N186 End stage renal disease: Secondary | ICD-10-CM | POA: Diagnosis not present

## 2022-05-23 DIAGNOSIS — Z992 Dependence on renal dialysis: Secondary | ICD-10-CM | POA: Diagnosis not present

## 2022-05-26 DIAGNOSIS — N186 End stage renal disease: Secondary | ICD-10-CM | POA: Diagnosis not present

## 2022-05-26 DIAGNOSIS — Z992 Dependence on renal dialysis: Secondary | ICD-10-CM | POA: Diagnosis not present

## 2022-05-27 DIAGNOSIS — N186 End stage renal disease: Secondary | ICD-10-CM | POA: Diagnosis not present

## 2022-05-27 DIAGNOSIS — J219 Acute bronchiolitis, unspecified: Secondary | ICD-10-CM | POA: Diagnosis not present

## 2022-05-28 DIAGNOSIS — Z992 Dependence on renal dialysis: Secondary | ICD-10-CM | POA: Diagnosis not present

## 2022-05-28 DIAGNOSIS — N186 End stage renal disease: Secondary | ICD-10-CM | POA: Diagnosis not present

## 2022-05-30 DIAGNOSIS — Z992 Dependence on renal dialysis: Secondary | ICD-10-CM | POA: Diagnosis not present

## 2022-05-30 DIAGNOSIS — N186 End stage renal disease: Secondary | ICD-10-CM | POA: Diagnosis not present

## 2022-06-01 ENCOUNTER — Encounter (HOSPITAL_COMMUNITY): Payer: Self-pay | Admitting: *Deleted

## 2022-06-01 ENCOUNTER — Emergency Department (HOSPITAL_COMMUNITY): Payer: Medicare Other

## 2022-06-01 ENCOUNTER — Inpatient Hospital Stay (HOSPITAL_COMMUNITY)
Admission: EM | Admit: 2022-06-01 | Discharge: 2022-06-04 | DRG: 193 | Disposition: A | Discharge status: Home / Self Care | Payer: Medicare Other | Attending: Family Medicine | Admitting: Family Medicine

## 2022-06-01 ENCOUNTER — Other Ambulatory Visit: Payer: Self-pay

## 2022-06-01 DIAGNOSIS — K219 Gastro-esophageal reflux disease without esophagitis: Secondary | ICD-10-CM | POA: Diagnosis not present

## 2022-06-01 DIAGNOSIS — Z992 Dependence on renal dialysis: Secondary | ICD-10-CM | POA: Diagnosis not present

## 2022-06-01 DIAGNOSIS — J90 Pleural effusion, not elsewhere classified: Secondary | ICD-10-CM | POA: Diagnosis present

## 2022-06-01 DIAGNOSIS — R079 Chest pain, unspecified: Secondary | ICD-10-CM | POA: Diagnosis not present

## 2022-06-01 DIAGNOSIS — D696 Thrombocytopenia, unspecified: Secondary | ICD-10-CM | POA: Diagnosis present

## 2022-06-01 DIAGNOSIS — Z79899 Other long term (current) drug therapy: Secondary | ICD-10-CM | POA: Diagnosis not present

## 2022-06-01 DIAGNOSIS — E1165 Type 2 diabetes mellitus with hyperglycemia: Secondary | ICD-10-CM | POA: Diagnosis not present

## 2022-06-01 DIAGNOSIS — I10 Essential (primary) hypertension: Secondary | ICD-10-CM | POA: Diagnosis not present

## 2022-06-01 DIAGNOSIS — E1122 Type 2 diabetes mellitus with diabetic chronic kidney disease: Secondary | ICD-10-CM | POA: Diagnosis present

## 2022-06-01 DIAGNOSIS — R051 Acute cough: Secondary | ICD-10-CM

## 2022-06-01 DIAGNOSIS — Z9841 Cataract extraction status, right eye: Secondary | ICD-10-CM | POA: Diagnosis not present

## 2022-06-01 DIAGNOSIS — I120 Hypertensive chronic kidney disease with stage 5 chronic kidney disease or end stage renal disease: Secondary | ICD-10-CM | POA: Diagnosis not present

## 2022-06-01 DIAGNOSIS — M898X9 Other specified disorders of bone, unspecified site: Secondary | ICD-10-CM | POA: Diagnosis not present

## 2022-06-01 DIAGNOSIS — N186 End stage renal disease: Secondary | ICD-10-CM | POA: Diagnosis present

## 2022-06-01 DIAGNOSIS — Z961 Presence of intraocular lens: Secondary | ICD-10-CM | POA: Diagnosis not present

## 2022-06-01 DIAGNOSIS — Z95818 Presence of other cardiac implants and grafts: Secondary | ICD-10-CM

## 2022-06-01 DIAGNOSIS — Z8673 Personal history of transient ischemic attack (TIA), and cerebral infarction without residual deficits: Secondary | ICD-10-CM | POA: Diagnosis not present

## 2022-06-01 DIAGNOSIS — Z888 Allergy status to other drugs, medicaments and biological substances status: Secondary | ICD-10-CM

## 2022-06-01 DIAGNOSIS — Z9842 Cataract extraction status, left eye: Secondary | ICD-10-CM

## 2022-06-01 DIAGNOSIS — D631 Anemia in chronic kidney disease: Secondary | ICD-10-CM | POA: Diagnosis present

## 2022-06-01 DIAGNOSIS — Z794 Long term (current) use of insulin: Secondary | ICD-10-CM | POA: Diagnosis not present

## 2022-06-01 DIAGNOSIS — Z7982 Long term (current) use of aspirin: Secondary | ICD-10-CM | POA: Diagnosis not present

## 2022-06-01 DIAGNOSIS — Z1152 Encounter for screening for COVID-19: Secondary | ICD-10-CM

## 2022-06-01 DIAGNOSIS — R0602 Shortness of breath: Secondary | ICD-10-CM | POA: Diagnosis not present

## 2022-06-01 DIAGNOSIS — Z89421 Acquired absence of other right toe(s): Secondary | ICD-10-CM | POA: Diagnosis not present

## 2022-06-01 DIAGNOSIS — E11649 Type 2 diabetes mellitus with hypoglycemia without coma: Secondary | ICD-10-CM | POA: Diagnosis not present

## 2022-06-01 DIAGNOSIS — J189 Pneumonia, unspecified organism: Secondary | ICD-10-CM | POA: Diagnosis not present

## 2022-06-01 DIAGNOSIS — R059 Cough, unspecified: Secondary | ICD-10-CM | POA: Diagnosis not present

## 2022-06-01 DIAGNOSIS — R918 Other nonspecific abnormal finding of lung field: Secondary | ICD-10-CM | POA: Diagnosis not present

## 2022-06-01 DIAGNOSIS — I700 Atherosclerosis of aorta: Secondary | ICD-10-CM | POA: Diagnosis not present

## 2022-06-01 LAB — CBC WITH DIFFERENTIAL/PLATELET
Abs Immature Granulocytes: 0.1 10*3/uL — ABNORMAL HIGH (ref 0.00–0.07)
Basophils Absolute: 0 10*3/uL (ref 0.0–0.1)
Basophils Relative: 0 %
Eosinophils Absolute: 0 10*3/uL (ref 0.0–0.5)
Eosinophils Relative: 0 %
HCT: 25.8 % — ABNORMAL LOW (ref 36.0–46.0)
Hemoglobin: 8.6 g/dL — ABNORMAL LOW (ref 12.0–15.0)
Immature Granulocytes: 1 %
Lymphocytes Relative: 4 %
Lymphs Abs: 0.6 10*3/uL — ABNORMAL LOW (ref 0.7–4.0)
MCH: 31.6 pg (ref 26.0–34.0)
MCHC: 33.3 g/dL (ref 30.0–36.0)
MCV: 94.9 fL (ref 80.0–100.0)
Monocytes Absolute: 1.5 10*3/uL — ABNORMAL HIGH (ref 0.1–1.0)
Monocytes Relative: 10 %
Neutro Abs: 12.9 10*3/uL — ABNORMAL HIGH (ref 1.7–7.7)
Neutrophils Relative %: 85 %
Platelets: 112 10*3/uL — ABNORMAL LOW (ref 150–400)
RBC: 2.72 MIL/uL — ABNORMAL LOW (ref 3.87–5.11)
RDW: 15.9 % — ABNORMAL HIGH (ref 11.5–15.5)
WBC: 15.1 10*3/uL — ABNORMAL HIGH (ref 4.0–10.5)
nRBC: 0 % (ref 0.0–0.2)

## 2022-06-01 LAB — BASIC METABOLIC PANEL
Anion gap: 12 (ref 5–15)
BUN: 36 mg/dL — ABNORMAL HIGH (ref 8–23)
CO2: 28 mmol/L (ref 22–32)
Calcium: 8.2 mg/dL — ABNORMAL LOW (ref 8.9–10.3)
Chloride: 96 mmol/L — ABNORMAL LOW (ref 98–111)
Creatinine, Ser: 7.26 mg/dL — ABNORMAL HIGH (ref 0.44–1.00)
GFR, Estimated: 5 mL/min — ABNORMAL LOW (ref >60)
Glucose, Bld: 185 mg/dL — ABNORMAL HIGH (ref 70–99)
Potassium: 3.5 mmol/L (ref 3.5–5.1)
Sodium: 136 mmol/L (ref 135–145)

## 2022-06-01 LAB — RESP PANEL BY RT-PCR (RSV, FLU A&B, COVID)  RVPGX2
Influenza A by PCR: NEGATIVE
Influenza B by PCR: NEGATIVE
Resp Syncytial Virus by PCR: NEGATIVE
SARS Coronavirus 2 by RT PCR: NEGATIVE

## 2022-06-01 MED ORDER — SODIUM CHLORIDE 0.9 % IV SOLN
500.0000 mg | Freq: Once | INTRAVENOUS | Status: AC
  Administered 2022-06-02: 500 mg via INTRAVENOUS
  Filled 2022-06-01: qty 5

## 2022-06-01 MED ORDER — IOHEXOL 300 MG/ML  SOLN
75.0000 mL | Freq: Once | INTRAMUSCULAR | Status: AC | PRN
  Administered 2022-06-01: 75 mL via INTRAVENOUS

## 2022-06-01 MED ORDER — SODIUM CHLORIDE 0.9 % IV SOLN
1.0000 g | Freq: Once | INTRAVENOUS | Status: AC
  Administered 2022-06-02: 1 g via INTRAVENOUS
  Filled 2022-06-01: qty 10

## 2022-06-01 NOTE — ED Provider Notes (Signed)
[facility] Provider Note   CSN: [id number] Arrival date & time: 06/01/22  1720     History  Chief Complaint  Patient presents with   Cough    [name] is a 81 y.o. female.  Patient complains of left-sided chest pain and a cough patient reports she has had pain in the left side of her chest for the past month.  Patient's family is concerned that she has pneumonia.  Complains of pain in her upper back.  Patient has a history of end-stage renal disease she is on dialysis Monday Wednesday Friday.  Patient did have dialysis yesterday  The history is provided by the patient. No language interpreter was used.  Cough Cough characteristics:  Non-productive Sputum characteristics:  Nondescript Severity:  Moderate Onset quality:  Gradual Duration:  4 weeks Timing:  Constant Progression:  Worsening Chronicity:  New Smoker: no   Relieved by:  Nothing Worsened by:  Nothing Ineffective treatments:  None tried Associated symptoms: chest pain   Associated symptoms: no fever and no shortness of breath   Risk factors: no recent infection        Home Medications Prior to Admission medications   Medication Sig Start Date End Date Taking? Authorizing Provider  albuterol (VENTOLIN HFA) 108 (90 Base) MCG/ACT inhaler Inhale 2 puffs into the lungs every 6 (six) hours as needed for wheezing or shortness of breath. 11/21/20   Gerlene Fee, NP  aspirin 325 MG tablet Take 650 mg by mouth daily. 07/09/18   Rogene Houston, MD  BD INSULIN SYRINGE U/F 31G X 5/16" 0.3 ML MISC Inject 1 each into the skin 2 (two) times daily. as directed 11/21/20   Gerlene Fee, NP  cyanocobalamin (,VITAMIN B-12,) 1000 MCG/ML injection Inject 1,000 mcg into the muscle every 30 (thirty) days. 11/01/14   [provider]  diclofenac Sodium (VOLTAREN) 1 % GEL Apply 4 g topically 4 (four) times daily as needed. Apply to bilateral knees 03/16/21   Scot Jun, FNP  doxycycline  (VIBRAMYCIN) 100 MG capsule Take 1 capsule (100 mg total) by mouth 2 (two) times daily. One po bid x 7 days 05/09/22   Milton Ferguson, MD  insulin NPH-regular Human (HUMULIN 70/30) (70-30) 100 UNIT/ML injection Inject 5-14 Units into the skin 2 (two) times daily with a meal. Patient taking differently: Inject 5-10 Units into the skin 2 (two) times daily with a meal. Take 10 units in the morning and 5 units at bedtime 02/03/22   Cassandria Anger, MD  labetalol (NORMODYNE) 200 MG tablet Take 200 mg by mouth 2 (two) times daily. 07/26/21   [provider]  lidocaine-prilocaine (EMLA) cream Apply 1 application topically every Monday, Wednesday, and Friday. 11/21/20   Gerlene Fee, NP  multivitamin (RENA-VIT) TABS tablet Take 1 tablet by mouth at bedtime. 08/21/14   Kirsteins, Luanna Salk, MD  omeprazole (PRILOSEC) 40 MG capsule TAKE ONE CAPSULE BY MOUTH TWICE DAILY BEFORE A meal 04/16/22   Mansouraty, Telford Nab., MD  pregabalin (LYRICA) 75 MG capsule Take 1 capsule (75 mg total) by mouth daily. Patient not taking: Reported on 04/15/2022 12/10/21   Suzan Slick, NP  sevelamer carbonate (RENVELA) 800 MG tablet Take 1 tablet (800 mg total) by mouth 2 (two) times daily before lunch and supper. 11/21/20   Gerlene Fee, NP  traMADol (ULTRAM) 50 MG tablet Take 1 tablet (50 mg total) by mouth every 8 (eight) hours as needed for moderate pain  or severe pain. Patient not taking: Reported on 04/15/2022 11/15/21   Suzan Slick, NP      Allergies    Ambien [zolpidem] and Reglan [metoclopramide]    Review of Systems   Review of Systems  Constitutional:  Negative for fever.  Respiratory:  Positive for cough. Negative for shortness of breath.   Cardiovascular:  Positive for chest pain.  All other systems reviewed and are negative.   Physical Exam Updated Vital Signs BP (!) 132/56   Pulse 77   Temp 98.3 F (36.8 C) (Oral)   Resp 18   Ht '5\' 7"'$  (1.702 m)   Wt 72.9 kg   SpO2 99%   BMI 25.17  kg/m  Physical Exam Vitals and nursing note reviewed.  Constitutional:      Appearance: She is well-developed.  HENT:     Head: Normocephalic.     Mouth/Throat:     Mouth: Mucous membranes are moist.  Cardiovascular:     Rate and Rhythm: Normal rate.  Pulmonary:     Effort: Pulmonary effort is normal.  Abdominal:     General: There is no distension.  Musculoskeletal:        General: Normal range of motion.     Cervical back: Normal range of motion.  Skin:    General: Skin is warm.  Neurological:     General: No focal deficit present.     Mental Status: She is alert and oriented to person, place, and time.  Psychiatric:        Mood and Affect: Mood normal.     ED Results / Procedures / Treatments   Labs (all labs ordered are listed, but only abnormal results are displayed) Labs Reviewed  CBC WITH DIFFERENTIAL/PLATELET - Abnormal; Notable for the following components:      Result Value   WBC 15.1 (*)    RBC 2.72 (*)    Hemoglobin 8.6 (*)    HCT 25.8 (*)    RDW 15.9 (*)    Platelets 112 (*)    Neutro Abs 12.9 (*)    Lymphs Abs 0.6 (*)    Monocytes Absolute 1.5 (*)    Abs Immature Granulocytes 0.10 (*)    All other components within normal limits  BASIC METABOLIC PANEL - Abnormal; Notable for the following components:   Chloride 96 (*)    Glucose, Bld 185 (*)    BUN 36 (*)    Creatinine, Ser 7.26 (*)    Calcium 8.2 (*)    GFR, Estimated 5 (*)    All other components within normal limits  RESP PANEL BY RT-PCR (RSV, FLU A&B, COVID)  RVPGX2    EKG None  Radiology DG Ribs Unilateral W/Chest Left  Result Date: 06/01/2022 CLINICAL DATA:  Shortness of breath, cough EXAM: LEFT RIBS AND CHEST - 3+ VIEW COMPARISON:  Chest radiograph dated 05/09/2022 FINDINGS: Rounded/masslike left upper lobe opacity, new from recent prior chest radiograph, suspicious for pneumonia. Left basilar opacity, atelectasis versus pneumonia. No pleural effusion or pneumothorax. The heart is  top-normal in size. No displaced left rib fracture is seen. IMPRESSION: Rounded/masslike left upper lobe opacity, new from recent chest radiograph, suspicious for pneumonia. Left basilar opacity, atelectasis versus pneumonia. At a minimum, follow-up chest radiographs are suggested in 3-4 weeks to document clearance. If the patient does not have signs/symptoms of infection, consider CT chest with contrast for further evaluation. No displaced left rib fracture is seen. Electronically Signed   By: Henderson Newcomer.D.  On: 06/01/2022 19:03    Procedures Procedures    Medications Ordered in ED Medications - No data to display  ED Course/ Medical Decision Making/ A&P                           Medical Decision Making Patient complains of left-sided back and chest discomfort.  Patient reports she has a cough and congestion  Amount and/or Complexity of Data Reviewed Independent Historian: friend    Details: Patient has a friend here with her who is supportive External Data Reviewed: notes.    Details: Previous notes reviewed patient is followed by endocrinology and nephrology Labs: ordered. Decision-making details documented in ED Course.    Details: Labs ordered reviewed and interpreted patient has an elevated white blood cell count of 15.1 her BUN is 36 creatinine is 7.26 patient has a normal potassium Radiology: ordered and independent interpretation performed. Decision-making details documented in ED Course.    Details: Left rib series ordered at triage shows possible new left upper lobe opacity.  Radiologist recommends CT for follow-up  Risk Prescription drug management. Risk Details: Discussed the patient with Dr. Rogene Houston he advised obtaining CT scan of patient's chest for further evaluation.           Final Clinical Impression(s) / ED Diagnoses Final diagnoses:  Acute cough    Rx / DC Orders ED Discharge Orders     None      Pt's care turned over to Dr. Christy Gentles  for disposition pending ct scan.    Sidney Ace 06/01/22 2352    Ripley Fraise, MD 06/02/22 [phone]

## 2022-06-01 NOTE — ED Triage Notes (Signed)
Pt c/o cough with pain to left side and upper back x 1 month. Pt has been seen here at [facility] and her PCP with no diagnosis per family. Denies fever.

## 2022-06-01 NOTE — ED Provider Triage Note (Signed)
Emergency Medicine Provider Triage Evaluation Note  [name] , a 81 y.o. female  was evaluated in triage.  Pt complains of a 1 month history of left sided chest pain with cough.  She describes sharp stabbing pain with breath, cough and certain movements.  Denies injury or fall.  No fevers.  Cough has been wet sounding but not productive.  Pcp gave mucinex without relief.   Review of Systems  Positive: Sob, cough, cp Negative: Fevers, n/v, abd pain  Physical Exam  BP (!) 125/54 (BP Location: Left Arm)   Pulse 77   Temp 98.9 F (37.2 C) (Oral)   Resp 18   Ht '5\' 7"'$  (1.702 m)   Wt 72.9 kg   SpO2 98%   BMI 25.17 kg/m  Gen:   Awake, no distress   Resp:  Mild splinting,  ttp left mid ribs, no palpable deformity MSK:   Moves extremities without difficulty , no lower extremity edema or pain. Other:  Fistula right arm,  hum present.  Medical Decision Making  Medically screening exam initiated at 6:05 PM.  Appropriate orders placed.  TINZLEE CRAKER was informed that the remainder of the evaluation will be completed by another provider, this initial triage assessment does not replace that evaluation, and the importance of remaining in the ED until their evaluation is complete.     Barbara Jefferson, PA-C 06/01/22 1808

## 2022-06-02 DIAGNOSIS — E1165 Type 2 diabetes mellitus with hyperglycemia: Secondary | ICD-10-CM | POA: Diagnosis not present

## 2022-06-02 DIAGNOSIS — N2581 Secondary hyperparathyroidism of renal origin: Secondary | ICD-10-CM | POA: Diagnosis not present

## 2022-06-02 DIAGNOSIS — Z794 Long term (current) use of insulin: Secondary | ICD-10-CM | POA: Diagnosis not present

## 2022-06-02 DIAGNOSIS — N186 End stage renal disease: Secondary | ICD-10-CM | POA: Diagnosis not present

## 2022-06-02 DIAGNOSIS — Z1152 Encounter for screening for COVID-19: Secondary | ICD-10-CM | POA: Diagnosis not present

## 2022-06-02 DIAGNOSIS — M898X9 Other specified disorders of bone, unspecified site: Secondary | ICD-10-CM | POA: Diagnosis present

## 2022-06-02 DIAGNOSIS — E11649 Type 2 diabetes mellitus with hypoglycemia without coma: Secondary | ICD-10-CM | POA: Diagnosis present

## 2022-06-02 DIAGNOSIS — Z79899 Other long term (current) drug therapy: Secondary | ICD-10-CM | POA: Diagnosis not present

## 2022-06-02 DIAGNOSIS — J90 Pleural effusion, not elsewhere classified: Secondary | ICD-10-CM | POA: Insufficient documentation

## 2022-06-02 DIAGNOSIS — Z89421 Acquired absence of other right toe(s): Secondary | ICD-10-CM | POA: Diagnosis not present

## 2022-06-02 DIAGNOSIS — J189 Pneumonia, unspecified organism: Secondary | ICD-10-CM | POA: Diagnosis present

## 2022-06-02 DIAGNOSIS — Z9842 Cataract extraction status, left eye: Secondary | ICD-10-CM | POA: Diagnosis not present

## 2022-06-02 DIAGNOSIS — Z992 Dependence on renal dialysis: Secondary | ICD-10-CM | POA: Diagnosis not present

## 2022-06-02 DIAGNOSIS — D696 Thrombocytopenia, unspecified: Secondary | ICD-10-CM

## 2022-06-02 DIAGNOSIS — Z888 Allergy status to other drugs, medicaments and biological substances status: Secondary | ICD-10-CM | POA: Diagnosis not present

## 2022-06-02 DIAGNOSIS — Z9841 Cataract extraction status, right eye: Secondary | ICD-10-CM | POA: Diagnosis not present

## 2022-06-02 DIAGNOSIS — Z7982 Long term (current) use of aspirin: Secondary | ICD-10-CM | POA: Diagnosis not present

## 2022-06-02 DIAGNOSIS — Z961 Presence of intraocular lens: Secondary | ICD-10-CM | POA: Diagnosis present

## 2022-06-02 DIAGNOSIS — Z8673 Personal history of transient ischemic attack (TIA), and cerebral infarction without residual deficits: Secondary | ICD-10-CM | POA: Diagnosis not present

## 2022-06-02 DIAGNOSIS — I10 Essential (primary) hypertension: Secondary | ICD-10-CM

## 2022-06-02 DIAGNOSIS — E1122 Type 2 diabetes mellitus with diabetic chronic kidney disease: Secondary | ICD-10-CM | POA: Diagnosis present

## 2022-06-02 DIAGNOSIS — D631 Anemia in chronic kidney disease: Secondary | ICD-10-CM | POA: Diagnosis not present

## 2022-06-02 DIAGNOSIS — K219 Gastro-esophageal reflux disease without esophagitis: Secondary | ICD-10-CM | POA: Diagnosis not present

## 2022-06-02 DIAGNOSIS — I120 Hypertensive chronic kidney disease with stage 5 chronic kidney disease or end stage renal disease: Secondary | ICD-10-CM | POA: Diagnosis not present

## 2022-06-02 DIAGNOSIS — Z95818 Presence of other cardiac implants and grafts: Secondary | ICD-10-CM | POA: Diagnosis not present

## 2022-06-02 LAB — COMPREHENSIVE METABOLIC PANEL
ALT: 11 U/L (ref 0–44)
AST: 16 U/L (ref 15–41)
Albumin: 3 g/dL — ABNORMAL LOW (ref 3.5–5.0)
Alkaline Phosphatase: 79 U/L (ref 38–126)
Anion gap: 13 (ref 5–15)
BUN: 41 mg/dL — ABNORMAL HIGH (ref 8–23)
CO2: 28 mmol/L (ref 22–32)
Calcium: 8 mg/dL — ABNORMAL LOW (ref 8.9–10.3)
Chloride: 96 mmol/L — ABNORMAL LOW (ref 98–111)
Creatinine, Ser: 8.11 mg/dL — ABNORMAL HIGH (ref 0.44–1.00)
GFR, Estimated: 5 mL/min — ABNORMAL LOW (ref >60)
Glucose, Bld: 220 mg/dL — ABNORMAL HIGH (ref 70–99)
Potassium: 4.3 mmol/L (ref 3.5–5.1)
Sodium: 137 mmol/L (ref 135–145)
Total Bilirubin: 0.8 mg/dL (ref 0.3–1.2)
Total Protein: 6.7 g/dL (ref 6.5–8.1)

## 2022-06-02 LAB — MAGNESIUM: Magnesium: 2.2 mg/dL (ref 1.7–2.4)

## 2022-06-02 LAB — CBC
HCT: 27.1 % — ABNORMAL LOW (ref 36.0–46.0)
Hemoglobin: 9 g/dL — ABNORMAL LOW (ref 12.0–15.0)
MCH: 31.5 pg (ref 26.0–34.0)
MCHC: 33.2 g/dL (ref 30.0–36.0)
MCV: 94.8 fL (ref 80.0–100.0)
Platelets: 119 10*3/uL — ABNORMAL LOW (ref 150–400)
RBC: 2.86 MIL/uL — ABNORMAL LOW (ref 3.87–5.11)
RDW: 15.9 % — ABNORMAL HIGH (ref 11.5–15.5)
WBC: 18.9 10*3/uL — ABNORMAL HIGH (ref 4.0–10.5)
nRBC: 0 % (ref 0.0–0.2)

## 2022-06-02 LAB — PROCALCITONIN: Procalcitonin: 3.46 ng/mL

## 2022-06-02 LAB — GLUCOSE, CAPILLARY
Glucose-Capillary: 208 mg/dL — ABNORMAL HIGH (ref 70–99)
Glucose-Capillary: 252 mg/dL — ABNORMAL HIGH (ref 70–99)
Glucose-Capillary: 237 mg/dL — ABNORMAL HIGH (ref 70–99)
Glucose-Capillary: 73 mg/dL (ref 70–99)

## 2022-06-02 LAB — PHOSPHORUS: Phosphorus: 4.6 mg/dL (ref 2.5–4.6)

## 2022-06-02 MED ORDER — HEPARIN SODIUM (PORCINE) 5000 UNIT/ML IJ SOLN
5000.0000 [IU] | Freq: Three times a day (TID) | INTRAMUSCULAR | Status: DC
  Administered 2022-06-02 – 2022-06-04 (×7): 5000 [IU] via SUBCUTANEOUS
  Filled 2022-06-02 (×7): qty 1

## 2022-06-02 MED ORDER — PANTOPRAZOLE SODIUM 40 MG PO TBEC
80.0000 mg | DELAYED_RELEASE_TABLET | Freq: Every day | ORAL | Status: DC
  Administered 2022-06-02 – 2022-06-04 (×3): 80 mg via ORAL
  Filled 2022-06-02 (×3): qty 2

## 2022-06-02 MED ORDER — LABETALOL HCL 200 MG PO TABS
200.0000 mg | ORAL_TABLET | Freq: Two times a day (BID) | ORAL | Status: DC
  Administered 2022-06-02 – 2022-06-04 (×5): 200 mg via ORAL
  Filled 2022-06-02 (×5): qty 1

## 2022-06-02 MED ORDER — LIDOCAINE HCL (PF) 1 % IJ SOLN: 5.0000 mL | INTRAMUSCULAR | Status: DC | PRN

## 2022-06-02 MED ORDER — ONDANSETRON HCL 4 MG PO TABS: 4.0000 mg | ORAL_TABLET | Freq: Four times a day (QID) | ORAL | Status: DC | PRN

## 2022-06-02 MED ORDER — ACETAMINOPHEN 650 MG RE SUPP: 650.0000 mg | Freq: Four times a day (QID) | RECTAL | Status: DC | PRN

## 2022-06-02 MED ORDER — LIDOCAINE-PRILOCAINE 2.5-2.5 % EX CREA: 1.0000 | TOPICAL_CREAM | CUTANEOUS | Status: DC | PRN

## 2022-06-02 MED ORDER — BENZONATATE 100 MG PO CAPS
100.0000 mg | ORAL_CAPSULE | Freq: Three times a day (TID) | ORAL | Status: DC | PRN
  Administered 2022-06-03 – 2022-06-04 (×2): 100 mg via ORAL
  Filled 2022-06-02 (×2): qty 1

## 2022-06-02 MED ORDER — INSULIN ASPART 100 UNIT/ML IJ SOLN: 0.0000 [IU] | Freq: Every day | INTRAMUSCULAR | Status: DC

## 2022-06-02 MED ORDER — CALCITRIOL 0.25 MCG PO CAPS
1.0000 ug | ORAL_CAPSULE | ORAL | Status: DC
  Administered 2022-06-02: 1 ug via ORAL
  Filled 2022-06-02: qty 4

## 2022-06-02 MED ORDER — CHLORHEXIDINE GLUCONATE CLOTH 2 % EX PADS
6.0000 | MEDICATED_PAD | Freq: Every day | CUTANEOUS | Status: DC
  Administered 2022-06-02 – 2022-06-03 (×2): 6 via TOPICAL

## 2022-06-02 MED ORDER — HYDROCODONE-ACETAMINOPHEN 5-325 MG PO TABS
1.0000 | ORAL_TABLET | Freq: Once | ORAL | Status: AC
  Administered 2022-06-02: 1 via ORAL
  Filled 2022-06-02: qty 1

## 2022-06-02 MED ORDER — ANTICOAGULANT SODIUM CITRATE 4% (200MG/5ML) IV SOLN: 5.0000 mL | Status: DC | PRN

## 2022-06-02 MED ORDER — INSULIN ASPART 100 UNIT/ML IJ SOLN
0.0000 [IU] | Freq: Three times a day (TID) | INTRAMUSCULAR | Status: DC
  Administered 2022-06-02 (×2): 2 [IU] via SUBCUTANEOUS
  Administered 2022-06-02 – 2022-06-03 (×3): 3 [IU] via SUBCUTANEOUS
  Administered 2022-06-04: 2 [IU] via SUBCUTANEOUS
  Administered 2022-06-04: 1 [IU] via SUBCUTANEOUS

## 2022-06-02 MED ORDER — ONDANSETRON HCL 4 MG/2ML IJ SOLN
4.0000 mg | Freq: Four times a day (QID) | INTRAMUSCULAR | Status: DC | PRN
  Administered 2022-06-04: 4 mg via INTRAVENOUS

## 2022-06-02 MED ORDER — DARBEPOETIN ALFA 150 MCG/0.3ML IJ SOSY: 150.0000 ug | PREFILLED_SYRINGE | INTRAMUSCULAR | Status: DC

## 2022-06-02 MED ORDER — SEVELAMER CARBONATE 800 MG PO TABS
800.0000 mg | ORAL_TABLET | Freq: Two times a day (BID) | ORAL | Status: DC
  Administered 2022-06-02 – 2022-06-04 (×4): 800 mg via ORAL
  Filled 2022-06-02 (×5): qty 1

## 2022-06-02 MED ORDER — PENTAFLUOROPROP-TETRAFLUOROETH EX AERO: 1.0000 | INHALATION_SPRAY | CUTANEOUS | Status: DC | PRN

## 2022-06-02 MED ORDER — DM-GUAIFENESIN ER 30-600 MG PO TB12
1.0000 | ORAL_TABLET | Freq: Two times a day (BID) | ORAL | Status: DC
  Administered 2022-06-02 – 2022-06-04 (×5): 1 via ORAL
  Filled 2022-06-02 (×5): qty 1

## 2022-06-02 MED ORDER — RENA-VITE PO TABS
1.0000 | ORAL_TABLET | Freq: Every day | ORAL | Status: DC
  Administered 2022-06-02 – 2022-06-03 (×2): 1 via ORAL
  Filled 2022-06-02 (×2): qty 1

## 2022-06-02 MED ORDER — SODIUM CHLORIDE 0.9 % IV SOLN
500.0000 mg | INTRAVENOUS | Status: DC
  Administered 2022-06-02 – 2022-06-04 (×3): 500 mg via INTRAVENOUS
  Filled 2022-06-02 (×3): qty 5

## 2022-06-02 MED ORDER — ACETAMINOPHEN 325 MG PO TABS: 650.0000 mg | ORAL_TABLET | Freq: Four times a day (QID) | ORAL | Status: DC | PRN

## 2022-06-02 MED ORDER — SODIUM CHLORIDE 0.9 % IV SOLN
1.0000 g | INTRAVENOUS | Status: DC
  Administered 2022-06-02 – 2022-06-04 (×3): 1 g via INTRAVENOUS
  Filled 2022-06-02 (×3): qty 10

## 2022-06-02 MED ORDER — DARBEPOETIN ALFA 150 MCG/0.3ML IJ SOSY
PREFILLED_SYRINGE | INTRAMUSCULAR | Status: AC
  Administered 2022-06-02: 150 ug via SUBCUTANEOUS
  Filled 2022-06-02: qty 0.3

## 2022-06-02 MED ORDER — INSULIN GLARGINE-YFGN 100 UNIT/ML ~~LOC~~ SOLN
8.0000 [IU] | Freq: Every day | SUBCUTANEOUS | Status: DC
  Filled 2022-06-02 (×2): qty 0.08

## 2022-06-02 NOTE — Progress Notes (Signed)
PROGRESS NOTE    [name]  SPQ:[id number] DOB: [birth year] DOA: 06/01/2022 PCP: [name], MD    Brief Narrative:   [name] is a 81 y.o. female with past medical history significant for ESRD on HD MWF, DM 2, GERD, HTN who presented to [facility] ED on 12/17 from home with complaint of productive cough of yellow/green phlegm associated with left-sided chest soreness from frequent coughing.  Patient has previously seen her PCP and other ED visit and discharged with doxycycline.  Family is concerned that she may have underlying pneumonia so she was brought to the ED for further evaluation and management.  Patient denies fever, no nausea/vomiting, no abdominal pain.  In the ED, temperature 98.9 degrees Fahrenheit, HR 77, RR 18, BP 125/54, SpO2 98% on room air.  WBC 15.1, hemoglobin 8.6, platelets 112.  Sodium 136, potassium 3.5, chloride 96, CO2 28, glucose 185, BUN 36, creatinine 7.26.  COVID-19 PCR negative.  Influenza A/B PCR negative.  Rib x-ray with rounded/masslike left upper lobe opacity suspicious for pneumonia, left basilar opacity consistent with atelectasis versus pneumonia.  CT chest with multifocal pneumonia left upper lobe and right middle lobe, moderate left and small right pleural effusions with no frank interstitial edema.  Patient was started on azithromycin and ceftriaxone.  TRH consulted for admission for further evaluation management of pneumonia.  Assessment & Plan:   Multifocal pneumonia, POA Patient presenting to ED with persistent cough, recently prescribed doxycycline outpatient.  Patient with elevated WBC count of 15.1, afebrile on admission.  Influenza A/B PCR negative.  CT chest with multifocal pneumonia left upper lobe and right middle lobe. -- WBC 15.1>18.9 -- Procalcitonin 3.46 -- Strep pneumo and Legionella urinary antigen: Pending -- Sputum culture: Pending -- Blood cultures x 2: Pending -- Azithromycin 500 mg IV q24h -- Ceftriaxone 1 g IV  q24h -- Supportive care, antitussives -- CBC, procalcitonin daily  Pleural effusion CT chest with moderate left and small right pleural effusions.  Currently oxygenating well on room air. -- Continue volume management with HD  Essential hypertension -- Labetalol 200 mg p.o. twice daily  ESRD on HD MWF Currently dialyzes at [facility].  Denies any missed sessions. -- Nephrology consulted for continue HD while inpatient  Type 2 diabetes mellitus, with hyperglycemia Hemoglobin A1c 8.1 on 02/03/22, not optimally controlled. -- Semglee 8 units obviously nightly -- Very sensitive SSI for coverage -- CBGs before every meal/at bedtime  Thrombocytopenia Platelet count 112>119; stable  GERD: Protonix 80 mg p.o. daily  Weakness/deconditioning/debility: Patient resides with her daughter, utilizes a rollator at baseline. -- PT/OT evaluation   DVT prophylaxis: heparin injection 5,000 Units Start: 06/02/22 0600 SCDs Start: 06/02/22 0435    Code Status: Full Code Family Communication: No family present at bedside this morning  Disposition Plan:  Level of care: Med-Surg Status is: Inpatient Remains inpatient appropriate because: IV antibiotics    Consultants:  Nephrology  Procedures:  None  Antimicrobials:  Azithromycin 12/17>> Ceftriaxone 12/17>>   Subjective: Patient seen examined bedside, resting comfortably.  Lying in bed.  Complaining of continued productive cough and asking about dialysis today.  No other specific complaints or concerns at this time.  Denies headache, no dizziness, no current chest pain, no palpitations, no shortness of breath, no abdominal pain, no fever/chills/night sweats, no nausea/vomiting/diarrhea, no focal weakness, no fatigue, no paresthesias.  No acute events overnight per nursing staff.  Objective: Vitals:   06/02/22 0330 06/02/22 0345 06/02/22 0807 06/02/22 1209  BP: Marland Kitchen)  134/57 (!) 152/58 132/76 (!) 121/51  Pulse: 84  85 78  Resp:  (!) '21  20 18  '$ Temp: 98.4 F (36.9 C) 98.6 F (37 C) 98.4 F (36.9 C) 98.2 F (36.8 C)  TempSrc: Oral Oral Oral Oral  SpO2: 95%  97% 97%  Weight:      Height:        Intake/Output Summary (Last 24 hours) at 06/02/2022 1325 Last data filed at 06/02/2022 0335 Gross per 24 hour  Intake 350 ml  Output --  Net 350 ml   Filed Weights   06/01/22 1736  Weight: 72.9 kg    Examination:  Physical Exam: GEN: NAD, alert and oriented x 3, wd/wn HEENT: NCAT, PERRL, EOMI, sclera clear, MMM PULM: CTAB w/o wheezes/crackles, normal respiratory effort CV: RRR w/o M/G/R GI: abd soft, NTND, NABS, no R/G/M MSK: no peripheral edema, muscle strength globally intact 5/5 bilateral upper/lower extremities NEURO: CN II-XII intact, no focal deficits, sensation to light touch intact PSYCH: normal mood/affect Integumentary: dry/intact, no rashes or wounds    Data Reviewed: I have personally reviewed following labs and imaging studies  CBC: Recent Labs  Lab 06/01/22 1910 06/02/22 0740  WBC 15.1* 18.9*  NEUTROABS 12.9*  --   HGB 8.6* 9.0*  HCT 25.8* 27.1*  MCV 94.9 94.8  PLT 112* 960*   Basic Metabolic Panel: Recent Labs  Lab 06/01/22 1910 06/02/22 0740  NA 136 137  K 3.5 4.3  CL 96* 96*  CO2 28 28  GLUCOSE 185* 220*  BUN 36* 41*  CREATININE 7.26* 8.11*  CALCIUM 8.2* 8.0*  MG  --  2.2  PHOS  --  4.6   GFR: Estimated Creatinine Clearance: 5.2 mL/min (A) (by C-G formula based on SCr of 8.11 mg/dL (H)). Liver Function Tests: Recent Labs  Lab 06/02/22 0740  AST 16  ALT 11  ALKPHOS 79  BILITOT 0.8  PROT 6.7  ALBUMIN 3.0*   No results for input(s): "LIPASE", "AMYLASE" in the last 168 hours. No results for input(s): "AMMONIA" in the last 168 hours. Coagulation Profile: No results for input(s): "INR", "PROTIME" in the last 168 hours. Cardiac Enzymes: No results for input(s): "CKTOTAL", "CKMB", "CKMBINDEX", "TROPONINI" in the last 168 hours. BNP (last 3 results) No  results for input(s): "PROBNP" in the last 8760 hours. HbA1C: No results for input(s): "HGBA1C" in the last 72 hours. CBG: Recent Labs  Lab 06/02/22 0755  GLUCAP 208*   Lipid Profile: No results for input(s): "CHOL", "HDL", "LDLCALC", "TRIG", "CHOLHDL", "LDLDIRECT" in the last 72 hours. Thyroid Function Tests: No results for input(s): "TSH", "T4TOTAL", "FREET4", "T3FREE", "THYROIDAB" in the last 72 hours. Anemia Panel: No results for input(s): "VITAMINB12", "FOLATE", "FERRITIN", "TIBC", "IRON", "RETICCTPCT" in the last 72 hours. Sepsis Labs: Recent Labs  Lab 06/02/22 0740  PROCALCITON 3.46    Recent Results (from the past 240 hour(s))  Resp panel by RT-PCR (RSV, Flu A&B, Covid) Anterior Nasal Swab     Status: None   Collection Time: 06/01/22  8:03 PM   Specimen: Anterior Nasal Swab  Result Value Ref Range Status   SARS Coronavirus 2 by RT PCR NEGATIVE NEGATIVE Final    Comment: (NOTE) SARS-CoV-2 target nucleic acids are NOT DETECTED.  The SARS-CoV-2 RNA is generally detectable in upper respiratory specimens during the acute phase of infection. The lowest concentration of SARS-CoV-2 viral copies this assay can detect is 138 copies/mL. A negative result does not preclude SARS-Cov-2 infection and should not be used as the  sole basis for treatment or other patient management decisions. A negative result may occur with  improper specimen collection/handling, submission of specimen other than nasopharyngeal swab, presence of viral mutation(s) within the areas targeted by this assay, and inadequate number of viral copies(<138 copies/mL). A negative result must be combined with clinical observations, patient history, and epidemiological information. The expected result is Negative.  Fact Sheet for Patients:  EntrepreneurPulse.com.au  Fact Sheet for Healthcare Providers:  IncredibleEmployment.be  This test is no t yet approved or cleared by  the [place] FDA and  has been authorized for detection and/or diagnosis of SARS-CoV-2 by FDA under an Emergency Use Authorization (EUA). This EUA will remain  in effect (meaning this test can be used) for the duration of the COVID-19 declaration under Section 564(b)(1) of the Act, 21 U.S.C.section 360bbb-3(b)(1), unless the authorization is terminated  or revoked sooner.       Influenza A by PCR NEGATIVE NEGATIVE Final   Influenza B by PCR NEGATIVE NEGATIVE Final    Comment: (NOTE) The Xpert Xpress SARS-CoV-2/FLU/RSV plus assay is intended as an aid in the diagnosis of influenza from Nasopharyngeal swab specimens and should not be used as a sole basis for treatment. Nasal washings and aspirates are unacceptable for Xpert Xpress SARS-CoV-2/FLU/RSV testing.  Fact Sheet for Patients: EntrepreneurPulse.com.au  Fact Sheet for Healthcare Providers: IncredibleEmployment.be  This test is not yet approved or cleared by the [place] FDA and has been authorized for detection and/or diagnosis of SARS-CoV-2 by FDA under an Emergency Use Authorization (EUA). This EUA will remain in effect (meaning this test can be used) for the duration of the COVID-19 declaration under Section 564(b)(1) of the Act, 21 U.S.C. section 360bbb-3(b)(1), unless the authorization is terminated or revoked.     Resp Syncytial Virus by PCR NEGATIVE NEGATIVE Final    Comment: (NOTE) Fact Sheet for Patients: EntrepreneurPulse.com.au  Fact Sheet for Healthcare Providers: IncredibleEmployment.be  This test is not yet approved or cleared by the [place] FDA and has been authorized for detection and/or diagnosis of SARS-CoV-2 by FDA under an Emergency Use Authorization (EUA). This EUA will remain in effect (meaning this test can be used) for the duration of the COVID-19 declaration under Section 564(b)(1) of the Act, 21  U.S.C. section 360bbb-3(b)(1), unless the authorization is terminated or revoked.  Performed at [facility], [address] [place], [place] [address]   Blood culture (routine x 2)     Status: None (Preliminary result)   Collection Time: 06/02/22 12:37 AM   Specimen: BLOOD LEFT HAND  Result Value Ref Range Status   Specimen Description BLOOD LEFT HAND BOTTLES DRAWN AEROBIC ONLY  Final   Special Requests   Final    Blood Culture adequate volume Performed at [facility], [address] [place], [place] [address]    Culture PENDING  Incomplete   Report Status PENDING  Incomplete  Blood culture (routine x 2)     Status: None (Preliminary result)   Collection Time: 06/02/22 12:44 AM   Specimen: BLOOD LEFT ARM  Result Value Ref Range Status   Specimen Description BLOOD LEFT ARM BOTTLES DRAWN AEROBIC AND ANAEROBIC  Final   Special Requests   Final    Blood Culture adequate volume Performed at [facility], [address], [place], [place] [address]    Culture PENDING  Incomplete   Report Status PENDING  Incomplete         Radiology Studies: CT Chest W Contrast  Result Date:  06/01/2022 CLINICAL DATA:  Chest pain, abnormal chest radiograph EXAM: CT CHEST WITH CONTRAST TECHNIQUE: Multidetector CT imaging of the chest was performed during intravenous contrast administration. RADIATION DOSE REDUCTION: This exam was performed according to the departmental dose-optimization program which includes automated exposure control, adjustment of the mA and/or kV according to patient size and/or use of iterative reconstruction technique. CONTRAST:  11m OMNIPAQUE IOHEXOL 300 MG/ML  SOLN COMPARISON:  Chest/left rib radiographs dated 06/01/2022 FINDINGS: Cardiovascular: The heart is normal in size. No pericardial effusion. No evidence of thoracic aortic aneurysm. Atherosclerotic calcifications of the aortic arch. Three vessel coronary atherosclerosis. Mediastinum/Nodes: No suspicious mediastinal  lymphadenopathy. Visualized thyroid is unremarkable. Lungs/Pleura: Focal patchy/sub solid opacity in the central left upper lobe, corresponding to the radiographic abnormality. Additional mild patchy opacity in the posterior right middle lobe (series 5/image 35). This appearance is compatible with multifocal pneumonia. No frank interstitial edema. Moderate left and small right pleural effusions. Associated bibasilar opacities, likely atelectasis. No pneumothorax. Upper Abdomen: Visualized upper abdomen is grossly unremarkable, noting vascular calcifications. Musculoskeletal: Mild degenerative changes of the visualized thoracolumbar spine. No rib fracture is seen. IMPRESSION: Multifocal pneumonia in the left upper lobe and right middle lobe, accounting for the radiographic abnormality. Follow-up chest radiographs are suggested in 4-6 weeks to document clearance. Moderate left and small right pleural effusions. No frank interstitial edema. No rib fracture is seen. Aortic Atherosclerosis (ICD10-I70.0). Electronically Signed   By: SJulian HyM.D.   On: 06/01/2022 23:50   DG Ribs Unilateral W/Chest Left  Result Date: 06/01/2022 CLINICAL DATA:  Shortness of breath, cough EXAM: LEFT RIBS AND CHEST - 3+ VIEW COMPARISON:  Chest radiograph dated 05/09/2022 FINDINGS: Rounded/masslike left upper lobe opacity, new from recent prior chest radiograph, suspicious for pneumonia. Left basilar opacity, atelectasis versus pneumonia. No pleural effusion or pneumothorax. The heart is top-normal in size. No displaced left rib fracture is seen. IMPRESSION: Rounded/masslike left upper lobe opacity, new from recent chest radiograph, suspicious for pneumonia. Left basilar opacity, atelectasis versus pneumonia. At a minimum, follow-up chest radiographs are suggested in 3-4 weeks to document clearance. If the patient does not have signs/symptoms of infection, consider CT chest with contrast for further evaluation. No displaced left  rib fracture is seen. Electronically Signed   By: SJulian HyM.D.   On: 06/01/2022 19:03        Scheduled Meds:  calcitRIOL  1 mcg Oral Q M,W,F-HD   Chlorhexidine Gluconate Cloth  6 each Topical Q0600   darbepoetin (ARANESP) injection - DIALYSIS  150 mcg Subcutaneous Q Mon-1800   dextromethorphan-guaiFENesin  1 tablet Oral BID   heparin  5,000 Units Subcutaneous Q8H   insulin aspart  0-5 Units Subcutaneous QHS   insulin aspart  0-6 Units Subcutaneous TID WC   insulin glargine-yfgn  8 Units Subcutaneous QHS   labetalol  200 mg Oral BID   multivitamin  1 tablet Oral QHS   pantoprazole  80 mg Oral Daily   sevelamer carbonate  800 mg Oral BID AC   Continuous Infusions:  azithromycin 500 mg (06/02/22 1235)   cefTRIAXone (ROCEPHIN)  IV 1 g (06/02/22 0958)     LOS: 0 days    Time spent: 51 minutes spent on chart review, discussion with nursing staff, consultants, updating family and interview/physical exam; more than 50% of that time was spent in counseling and/or coordination of care.    Rashaud Ybarbo J [place] DO [facility] Available via Epic secure chat 7am-7pm After these hours, please refer to coverage  provider listed on [email or web address] 06/02/2022, 1:25 PM

## 2022-06-02 NOTE — Consult Note (Signed)
Reason for Consult: To manage dialysis and dialysis related needs Referring Physician: [place]  [name] is an 81 y.o. female with DM, HTN, ESRD.  Presented to ER 12/17 with c/o productive cough and left sided CP-  eval found leukocytosis, CT chest mutifocal PNA-  moderate left pleural effusion-  admitted -  started on ceftriaxone and azithromycin.  Currently 97% on room air-  feels a little better.  Says is cramping with HD and has recently   Dialyzes at [facility] MWF  4 hours  EDW 75.5 ( getting to it and cramping-  got to 73 on 11/24 when here) . HD Bath 2K, Dialyzer unknown, Heparin no. Access AVF right-  has post bleeding a lot.  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   Dialysis patient ([place])    M, W, F   Fistula    R arm   GERD (gastroesophageal reflux disease)    Hypertension    Renal disorder    Shortness of breath    Stroke [facility])    right side weakness    Past Surgical History:  Procedure Laterality Date   AMPUTATION Right 01/13/2020   Procedure: AMPUTATION RIGHT FOOT 1ST RAY;  Surgeon: Newt Minion, MD;  Location: [place];  Service: Orthopedics;  Laterality: Right;   AV FISTULA PLACEMENT Right 09/08/2013   Procedure: CREATION OF RIGHT BRACHIAL CEPHALIC ARTERIOVENOUS FISTULA ;  Surgeon: Mal Misty, MD;  Location: [place];  Service: Vascular;  Laterality: Right;   [place] Right 01/26/2014   Procedure: Right Arm BASILIC VEIN TRANSPOSITION;  Surgeon: Mal Misty, MD;  Location: [place];  Service: Vascular;  Laterality: Right;   BIOPSY  09/10/2021   Procedure: BIOPSY;  Surgeon: Harvel Quale, MD;  [facility];  Service: Gastroenterology;;  gastric   BIOPSY  10/07/2021   Procedure: BIOPSY;  Surgeon: Irving Copas., MD;  [facility];  Service: Gastroenterology;;   BIOPSY  04/17/2022   Procedure: BIOPSY;  Surgeon: Irving Copas., MD;  Location[facility];  Service: Gastroenterology;;   CATARACT EXTRACTION W/PHACO  11/20/2011   Procedure: CATARACT EXTRACTION PHACO AND INTRAOCULAR LENS PLACEMENT ([place]);  Surgeon: Tonny Branch, MD;  [facility];  Service: Ophthalmology;  Laterality: Right;  CDE 18.82   CATARACT EXTRACTION W/PHACO Left 11/18/2012   Procedure: CATARACT EXTRACTION PHACO AND INTRAOCULAR LENS PLACEMENT (IOC);  Surgeon: Tonny Branch, MD;  [facility];  Service: Ophthalmology;  Laterality: Left;  CDE: 18.97   COLONOSCOPY N/A 02/09/2013   Procedure: COLONOSCOPY;  Surgeon: Rogene Houston, MD;  [facility];  Service: Endoscopy;  Laterality: N/A;  305-moved to 220 Ann to notify pt   COLONOSCOPY N/A 07/08/2018   Procedure: COLONOSCOPY;  Surgeon: Rogene Houston, MD;  [facility];  Service: Endoscopy;  Laterality: N/A;  930   ENDOSCOPIC MUCOSAL RESECTION N/A 10/07/2021   Procedure: ENDOSCOPIC MUCOSAL RESECTION;  Surgeon: Rush Landmark Telford Nab., MD;  [facility];  Service: Gastroenterology;  Laterality: N/A;   ESOPHAGOGASTRODUODENOSCOPY N/A 04/17/2022   Procedure: ESOPHAGOGASTRODUODENOSCOPY (EGD);  Surgeon: Irving Copas., MD;  [facility];  Service: Gastroenterology;  Laterality: N/A;   ESOPHAGOGASTRODUODENOSCOPY (EGD) WITH PROPOFOL N/A 09/10/2021   Procedure: ESOPHAGOGASTRODUODENOSCOPY (EGD)  WITH PROPOFOL;  Surgeon: Montez Morita, Quillian Quince, MD;  [facility];  Service: Gastroenterology;  Laterality: N/A;   ESOPHAGOGASTRODUODENOSCOPY (EGD) WITH PROPOFOL N/A 10/07/2021   Procedure: ESOPHAGOGASTRODUODENOSCOPY (EGD) WITH PROPOFOL;  Surgeon: Rush Landmark Telford Nab., MD;  [facility];  Service: Gastroenterology;  Laterality: N/A;   EUS N/A 10/07/2021   Procedure: UPPER ENDOSCOPIC ULTRASOUND (EUS) RADIAL;  Surgeon: Irving Copas., MD;  [facility];  Service: Gastroenterology;  Laterality: N/A;   GASTRIC VARICES BANDING  10/07/2021   Procedure: duodenal tumor BANDING;  Surgeon: Rush Landmark Telford Nab., MD;  [facility];  Service: Gastroenterology;;  with emr kit   HEMOSTASIS CLIP PLACEMENT  10/07/2021   Procedure: HEMOSTASIS CLIP PLACEMENT;  Surgeon: Irving Copas., MD;  [facility];  Service: Gastroenterology;;   INSERTION OF DIALYSIS CATHETER Right 06/24/2013   Procedure: INSERTION OF DIALYSIS CATHETER: Ultrasound guided;  Surgeon: Serafina Mitchell, MD;  Location: [place];  Service: Vascular;  Laterality: Right;   LIGATION OF ARTERIOVENOUS  FISTULA Right 01/26/2014   Procedure: LIGATION OF ARTERIOVENOUS  FISTULA;  Surgeon: Mal Misty, MD;  Location: [place];  Service: Vascular;  Laterality: Right;   LOOP RECORDER IMPLANT N/A 07/21/2013   Procedure: LOOP RECORDER IMPLANT;  Surgeon: Evans Lance, MD;  Location: [facility] C[facility] LAB;  Service: Cardiovascular;  Laterality: N/A;   POLYPECTOMY  07/08/2018   Procedure: POLYPECTOMY;  Surgeon: Rogene Houston, MD;  [facility];  Service: Endoscopy;;  Descending colon polyps x 2    SUBMUCOSAL LIFTING INJECTION  10/07/2021   Procedure: SUBMUCOSAL LIFTING INJECTION;  Surgeon: Irving Copas., MD;  [facility];  Service: Gastroenterology;;   TEE WITHOUT CARDIOVERSION N/A 07/21/2013   Procedure: TRANSESOPHAGEAL ECHOCARDIOGRAM (TEE);  Surgeon: Dorothy Spark, MD;  Location: [place];  Service: Cardiovascular;  Laterality: N/A;   UPPER ESOPHAGEAL ENDOSCOPIC ULTRASOUND (EUS) N/A 04/17/2022   Procedure: UPPER ESOPHAGEAL ENDOSCOPIC ULTRASOUND (EUS);  Surgeon: Irving Copas., MD;  [facility];  Service: Gastroenterology;  Laterality: N/A;    Family History  Problem Relation Age of Onset   Cancer Sister    Cancer Brother    Anesthesia problems Neg Hx    Hypotension Neg Hx    Malignant hyperthermia Neg  Hx    Pseudochol deficiency Neg Hx     Social History:  reports that she has never smoked. She has never used smokeless tobacco. She reports that she does not drink alcohol and does not use drugs.  Allergies:  Allergies  Allergen Reactions   Ambien [Zolpidem] Other (See Comments)    Hallucinations    Reglan [Metoclopramide] Other (See Comments)    "makes me crazy"    Medications: I have reviewed the patient's current medications.  Results for orders placed or performed during the hospital encounter of 06/01/22 (from the past 48 hour(s))  CBC with Differential     Status: Abnormal   Collection Time: 06/01/22  7:10 PM  Result Value Ref Range   WBC 15.1 (H) 4.0 - 10.5 K/uL   RBC 2.72 (L) 3.87 - 5.11 MIL/uL   Hemoglobin 8.6 (L) 12.0 - 15.0 g/dL   HCT 25.8 (L) 36.0 - 46.0 %   MCV 94.9 80.0 - 100.0 fL   MCH 31.6 26.0 - 34.0 pg   MCHC 33.3 30.0 - 36.0 g/dL   RDW 15.9 (H) 11.5 - 15.5 %   Platelets 112 (L) 150 - 400 K/uL   nRBC 0.0 0.0 - 0.2 %  Neutrophils Relative % 85 %   Neutro Abs 12.9 (H) 1.7 - 7.7 K/uL   Lymphocytes Relative 4 %   Lymphs Abs 0.6 (L) 0.7 - 4.0 K/uL   Monocytes Relative 10 %   Monocytes Absolute 1.5 (H) 0.1 - 1.0 K/uL   Eosinophils Relative 0 %   Eosinophils Absolute 0.0 0.0 - 0.5 K/uL   Basophils Relative 0 %   Basophils Absolute 0.0 0.0 - 0.1 K/uL   Immature Granulocytes 1 %   Abs Immature Granulocytes 0.10 (H) 0.00 - 0.07 K/uL    Comment: Performed at [facility], [address], [place], [place] [address]  Basic metabolic panel     Status: Abnormal   Collection Time: 06/01/22  7:10 PM  Result Value Ref Range   Sodium 136 135 - 145 mmol/L   Potassium 3.5 3.5 - 5.1 mmol/L   Chloride 96 (L) 98 - 111 mmol/L   CO2 28 22 - 32 mmol/L   Glucose, Bld 185 (H) 70 - 99 mg/dL    Comment: Glucose reference range applies only to samples taken after fasting for at least 8 hours.   BUN 36 (H) 8 - 23 mg/dL   Creatinine, Ser 7.26 (H) 0.44 - 1.00 mg/dL   Calcium  8.2 (L) 8.9 - 10.3 mg/dL   GFR, Estimated 5 (L) >60 mL/min    Comment: (NOTE) Calculated using the CKD-EPI Creatinine Equation (2021)    Anion gap 12 5 - 15    Comment: Performed at [facility], [address], [place], [place] [address]  Resp panel by RT-PCR (RSV, Flu A&B, Covid) Anterior Nasal Swab     Status: None   Collection Time: 06/01/22  8:03 PM   Specimen: Anterior Nasal Swab  Result Value Ref Range   SARS Coronavirus 2 by RT PCR NEGATIVE NEGATIVE    Comment: (NOTE) SARS-CoV-2 target nucleic acids are NOT DETECTED.  The SARS-CoV-2 RNA is generally detectable in upper respiratory specimens during the acute phase of infection. The lowest concentration of SARS-CoV-2 viral copies this assay can detect is 138 copies/mL. A negative result does not preclude SARS-Cov-2 infection and should not be used as the sole basis for treatment or other patient management decisions. A negative result may occur with  improper specimen collection/handling, submission of specimen other than nasopharyngeal swab, presence of viral mutation(s) within the areas targeted by this assay, and inadequate number of viral copies(<138 copies/mL). A negative result must be combined with clinical observations, patient history, and epidemiological information. The expected result is Negative.  Fact Sheet for Patients:  EntrepreneurPulse.com.au  Fact Sheet for Healthcare Providers:  IncredibleEmployment.be  This test is no t yet approved or cleared by the [place] FDA and  has been authorized for detection and/or diagnosis of SARS-CoV-2 by FDA under an Emergency Use Authorization (EUA). This EUA will remain  in effect (meaning this test can be used) for the duration of the COVID-19 declaration under Section 564(b)(1) of the Act, 21 U.S.C.section 360bbb-3(b)(1), unless the authorization is terminated  or revoked sooner.       Influenza A by PCR NEGATIVE NEGATIVE    Influenza B by PCR NEGATIVE NEGATIVE    Comment: (NOTE) The Xpert Xpress SARS-CoV-2/FLU/RSV plus assay is intended as an aid in the diagnosis of influenza from Nasopharyngeal swab specimens and should not be used as a sole basis for treatment. Nasal washings and aspirates are unacceptable for Xpert Xpress SARS-CoV-2/FLU/RSV testing.  Fact Sheet for Patients: EntrepreneurPulse.com.au  Fact Sheet for  Healthcare Providers: IncredibleEmployment.be  This test is not yet approved or cleared by the [place] and has been authorized for detection and/or diagnosis of SARS-CoV-2 by FDA under an Emergency Use Authorization (EUA). This EUA will remain in effect (meaning this test can be used) for the duration of the COVID-19 declaration under Section 564(b)(1) of the Act, 21 U.S.C. section 360bbb-3(b)(1), unless the authorization is terminated or revoked.     Resp Syncytial Virus by PCR NEGATIVE NEGATIVE    Comment: (NOTE) Fact Sheet for Patients: EntrepreneurPulse.com.au  Fact Sheet for Healthcare Providers: IncredibleEmployment.be  This test is not yet approved or cleared by the [place] FDA and has been authorized for detection and/or diagnosis of SARS-CoV-2 by FDA under an Emergency Use Authorization (EUA). This EUA will remain in effect (meaning this test can be used) for the duration of the COVID-19 declaration under Section 564(b)(1) of the Act, 21 U.S.C. section 360bbb-3(b)(1), unless the authorization is terminated or revoked.  Performed at [facility], [address] [place], [place] [address]   Blood culture (routine x 2)     Status: None (Preliminary result)   Collection Time: 06/02/22 12:37 AM   Specimen: BLOOD LEFT HAND  Result Value Ref Range   Specimen Description BLOOD LEFT HAND BOTTLES DRAWN AEROBIC ONLY    Special Requests      Blood Culture adequate volume Performed at [facility], [address] [place], [place] [address]    Culture PENDING    Report Status PENDING   Blood culture (routine x 2)     Status: None (Preliminary result)   Collection Time: 06/02/22 12:44 AM   Specimen: BLOOD LEFT ARM  Result Value Ref Range   Specimen Description BLOOD LEFT ARM BOTTLES DRAWN AEROBIC AND ANAEROBIC    Special Requests      Blood Culture adequate volume Performed at [facility], [address] [place], [place] [address]    Culture PENDING    Report Status PENDING   CBC     Status: Abnormal   Collection Time: 06/02/22  7:40 AM  Result Value Ref Range   WBC 18.9 (H) 4.0 - 10.5 K/uL   RBC 2.86 (L) 3.87 - 5.11 MIL/uL   Hemoglobin 9.0 (L) 12.0 - 15.0 g/dL   HCT 27.1 (L) 36.0 - 46.0 %   MCV 94.8 80.0 - 100.0 fL   MCH 31.5 26.0 - 34.0 pg   MCHC 33.2 30.0 - 36.0 g/dL   RDW 15.9 (H) 11.5 - 15.5 %   Platelets 119 (L) 150 - 400 K/uL   nRBC 0.0 0.0 - 0.2 %    Comment: Performed at [facility], [address], [place], [place] [address]  Glucose, capillary     Status: Abnormal   Collection Time: 06/02/22  7:55 AM  Result Value Ref Range   Glucose-Capillary 208 (H) 70 - 99 mg/dL    Comment: Glucose reference range applies only to samples taken after fasting for at least 8 hours.    CT Chest W Contrast  Result Date: 06/01/2022 CLINICAL DATA:  Chest pain, abnormal chest radiograph EXAM: CT CHEST WITH CONTRAST TECHNIQUE: Multidetector CT imaging of the chest was performed during intravenous contrast administration. RADIATION DOSE REDUCTION: This exam was performed according to the departmental dose-optimization program which includes automated exposure control, adjustment of the mA and/or kV according to patient size and/or use of iterative reconstruction technique. CONTRAST:  98m OMNIPAQUE IOHEXOL 300 MG/ML  SOLN COMPARISON:  Chest/left rib radiographs dated  06/01/2022 FINDINGS: Cardiovascular: The heart is normal in size. No pericardial effusion. No evidence of  thoracic aortic aneurysm. Atherosclerotic calcifications of the aortic arch. Three vessel coronary atherosclerosis. Mediastinum/Nodes: No suspicious mediastinal lymphadenopathy. Visualized thyroid is unremarkable. Lungs/Pleura: Focal patchy/sub solid opacity in the central left upper lobe, corresponding to the radiographic abnormality. Additional mild patchy opacity in the posterior right middle lobe (series 5/image 35). This appearance is compatible with multifocal pneumonia. No frank interstitial edema. Moderate left and small right pleural effusions. Associated bibasilar opacities, likely atelectasis. No pneumothorax. Upper Abdomen: Visualized upper abdomen is grossly unremarkable, noting vascular calcifications. Musculoskeletal: Mild degenerative changes of the visualized thoracolumbar spine. No rib fracture is seen. IMPRESSION: Multifocal pneumonia in the left upper lobe and right middle lobe, accounting for the radiographic abnormality. Follow-up chest radiographs are suggested in 4-6 weeks to document clearance. Moderate left and small right pleural effusions. No frank interstitial edema. No rib fracture is seen. Aortic Atherosclerosis (ICD10-I70.0). Electronically Signed   By: Julian Hy M.D.   On: 06/01/2022 23:50   DG Ribs Unilateral W/Chest Left  Result Date: 06/01/2022 CLINICAL DATA:  Shortness of breath, cough EXAM: LEFT RIBS AND CHEST - 3+ VIEW COMPARISON:  Chest radiograph dated 05/09/2022 FINDINGS: Rounded/masslike left upper lobe opacity, new from recent prior chest radiograph, suspicious for pneumonia. Left basilar opacity, atelectasis versus pneumonia. No pleural effusion or pneumothorax. The heart is top-normal in size. No displaced left rib fracture is seen. IMPRESSION: Rounded/masslike left upper lobe opacity, new from recent chest radiograph, suspicious for pneumonia. Left basilar opacity, atelectasis versus pneumonia. At a minimum, follow-up chest radiographs are suggested in 3-4  weeks to document clearance. If the patient does not have signs/symptoms of infection, consider CT chest with contrast for further evaluation. No displaced left rib fracture is seen. Electronically Signed   By: Julian Hy M.D.   On: 06/01/2022 19:03    ROS: coughing and left sided chest pain  Blood pressure 132/76, pulse 85, temperature 98.4 F (36.9 C), temperature source Oral, resp. rate 20, height _0  (1.702 m), weight 72.9 kg, SpO2 97 %. General appearance: alert, cooperative, and mild distress Resp: diminished breath sounds bibasilar Cardio: regular rate and rhythm, S1, S2 normal, no murmur, click, rub or gallop GI: soft, non-tender; bowel sounds normal; no masses,  no organomegaly Extremities: extremities normal, atraumatic, no cyanosis or edema Right upper arm AVF-  patent  Assessment/Plan: 81 year old BF with ESRD presents with PNA found on chest CT 1 PNA-  ceftriaxone and azithro per hosps 2 ESRD: cont with routine HD -  will be due today via AVF 3 Hypertension/volume: does not seem to be a major player here with her cough-  no peripheral edema and no frank edema on xray or CT-  is below EDW here today but will UF as able  4. Anemia of ESRD: hgb low-  not on ESA as OP-  will add-  was supposed to get iron today - will hold but likely give toward the end of her hosp 5. Metabolic Bone Disease: continue calcitriol and renvela    Louis Meckel 06/02/2022, 8:42 AM

## 2022-06-02 NOTE — Procedures (Signed)
   HEMODIALYSIS TREATMENT NOTE:  Uneventful 3.75 hour heparin-free treatment completed using right upper arm AVF (15g/antegrade). Goal met: 1.7 liters removed without interruption in UF.  All blood was returned and hemostasis was achieved in 20 minutes. No changes from pre-HD assessment.  Hand-off given to Reino Kent, LPN.   Rockwell Alexandria, RN

## 2022-06-02 NOTE — TOC Initial Note (Signed)
Transition of Care [facility]) - Initial/Assessment Note    Patient Details  Name: [name] MRN: [id number] Date of Birth: 07/31/1940  [facility] CM/SW Contact:    Ihor Gully, LCSW Phone Number: 06/02/2022, 4:39 PM  Clinical Narrative:                 Patient from home with sons. Daughter, [name], stays at night due to sons working third shift. Patient has walker and cane. On HD at [facility] on [address] [name] takes to appointments. 3n1 ordered via [employer]. Considered high risk for readmission.   Expected Discharge Plan: [place] Barriers to Discharge: Continued Medical Work up   Patient Goals and CMS Choice Patient states their goals for this hospitalization and ongoing recovery are:: return home      Expected Discharge Plan and Services Expected Discharge Plan: [place] Acute Care Choice: Durable Medical Equipment Living arrangements for the past 2 months: Single Family Home                 DME Arranged: 3-N-1 DME Agency: [employer] Date DME Agency Contacted: 06/02/22 Time DME Agency Contacted: [phone] Representative spoke with at DME Agency: Erasmo Downer            Prior Living Arrangements/Services Living arrangements for the past 2 months: [place] Lives with:: Adult Children Patient language and need for interpreter reviewed:: Yes        Need for Family Participation in Patient Care: Yes (Comment) Care giver support system in place?: Yes (comment) Current home services: DME (cane, walker) Criminal Activity/Legal Involvement Pertinent to Current Situation/Hospitalization: No - Comment as needed  Activities of Daily Living Home Assistive Devices/Equipment: Walker (specify type), Eyeglasses, Dentures (specify type) ADL Screening (condition at time of admission) Patient's cognitive ability adequate to safely complete daily activities?: No Is the patient deaf or have difficulty  hearing?: No Does the patient have difficulty seeing, even when wearing glasses/contacts?: No Does the patient have difficulty concentrating, remembering, or making decisions?: No Patient able to express need for assistance with ADLs?: Yes Does the patient have difficulty dressing or bathing?: No Independently performs ADLs?: Yes (appropriate for developmental age) Does the patient have difficulty walking or climbing stairs?: Yes Weakness of Legs: Both Weakness of Arms/Hands: None  Permission Sought/Granted Permission sought to share information with : Family Supports    Share Information with NAME: [name]           Emotional Assessment     Affect (typically observed): Appropriate Orientation: : Oriented to Situation Alcohol / Substance Use: Not Applicable Psych Involvement: No (comment)  Admission diagnosis:  Multifocal pneumonia [J18.9] Community acquired pneumonia, unspecified laterality [J18.9] Acute cough [R05.1] Patient Active Problem List   Diagnosis Date Noted   Multifocal pneumonia 06/02/2022   Pleural effusion 06/02/2022   End stage renal disease on dialysis ([place]) 06/02/2022   Spinal stenosis of lumbar region 01/07/2022   Schatzki's ring 10/15/2021   Esophageal dysphagia 10/15/2021   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   Hypertensive heart and CKD, ESRD on dialysis ([place]) 11/09/2020   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 due to type 2 diabetes mellitus ([place]) 11/09/2020   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 diabetes mellitus ([place]) 11/09/2020   Dependence on renal dialysis ([place]) 11/09/2020   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 disease ([place]) 11/09/2020   Hyperlipidemia associated with type 2 diabetes mellitus ([place])  11/09/2020   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 ([place]) 11/09/2020   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 ([place]) 11/09/2020   Chronic constipation 11/09/2020   Vitamin B 12  deficiency 11/09/2020   Acute respiratory failure with hypoxia ([place]) 10/26/2020   Elevated troponin 10/26/2020   Volume overload 10/26/2020   Hypertensive urgency 10/26/2020   History of CVA (cerebrovascular accident) 01/05/2020   Thrombocytopenia ([place]) 01/05/2020   History of colonic polyps 04/01/2018   Acute bronchitis 10/15/2017   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 ([place])    HCAP (healthcare-associated pneumonia) 05/29/2015   Hyperkalemia 05/29/2015   ESRD (end stage renal disease) on dialysis [facility])    Gastro-esophageal reflux disease without esophagitis 09/29/2014   Encounter for adequacy testing for hemodialysis ([place]) 01/17/2014   Mechanical complication of other vascular device, implant, and graft 01/17/2014   Disturbances of vision, late effect of stroke 11/03/2013   Right hemiparesis ([place]) 08/19/2013   CVA (cerebral infarction) 07/20/2013   Gastroparesis due to DM ([place]) 07/01/2013   Type 2 diabetes mellitus with hyperglycemia ([place]) 06/23/2013   Fall 05/25/2012   HLD (hyperlipidemia) 05/20/2006   Hereditary and idiopathic peripheral neuropathy 05/20/2006   Essential hypertension 05/20/2006   PCP:  [name], MD Pharmacy:  No Pharmacies Listed    Social Determinants of Health (SDOH) Interventions    Readmission Risk Interventions    01/17/2020    9:17 AM  Readmission Risk Prevention Plan  Transportation Screening Complete  PCP or Specialist Appt within 5-7 Days Not Complete  Not Complete comments pending disposition  Home Care Screening Complete  Medication Review (RN CM) Referral to Pharmacy

## 2022-06-02 NOTE — H&P (Signed)
History and Physical    Patient: [name] AJO:[id number] DOB: 1941-01-19 DOA: 06/01/2022 DOS: the patient was seen and examined on 06/02/2022 PCP: [name], MD  Patient coming from: Home  Chief Complaint:  Chief Complaint  Patient presents with   Cough   HPI: [name] is a 81 y.o. female with medical history significant of ESRD on HD (MWF), T2DM, GERD, essential hypertension who presents to the emergency department due to 1 month onset of cough productive of yellow phlegm with an associated left-sided chest soreness from frequent coughing.  She has been to her PCP and also came to the ED on 11/24 without any definite diagnosis and was discharged from the ED with doxycycline.  Family was concerned that patient might have pneumonia, so she was brought to the ED for further evaluation and management.  Patient denies fever, nausea, vomiting, abdominal pain.  ED Course:  In the emergency department, she was hemodynamically stable, O2 sat was 95% on room air.  Workup in the ED showed leukocytosis with a left shift, normocytic anemia, thrombocytopenia.  BMP showed sodium 136, potassium 3.4, chloride 96, bicarb 28, blood glucose 185, BUN/creatinine 36/7.26.  Influenza A, B, SARS coronavirus 2 was negative RSV was negative. CT chest with contrast showed multifocal pneumonia in the left upper lobe and right middle lobe accounting for radiographic abnormality.  Moderate left and small right pleural effusions.  No frank interstitial edema. Left ribs and chest x-ray showed rounded/masslike  left upper lobe opacity, new from recent chest radiograph, suspicious for pneumonia. Left basilar opacity, atelectasis versus pneumonia.  She was started on IV ceftriaxone and azithromycin, IV Dilaudid was given.  Hospitalist was asked to admit patient for further evaluation and management.  Review of Systems: Review of systems as noted in the HPI. All other systems reviewed and are  negative.   Past Medical History:  Diagnosis Date   Anemia    Cataract    Chronic kidney disease    CVA (cerebral infarction)    Diabetes mellitus with ESRD (end-stage renal disease) ([place])    Type 2   Dialysis patient ([place])    M, W, F   Fistula    R arm   GERD (gastroesophageal reflux disease)    Hypertension    Renal disorder    Shortness of breath    Stroke [facility])    right side weakness   Past Surgical History:  Procedure Laterality Date   AMPUTATION Right 01/13/2020   Procedure: AMPUTATION RIGHT FOOT 1ST RAY;  Surgeon: Newt Minion, MD;  Location: [place];  Service: Orthopedics;  Laterality: Right;   AV FISTULA PLACEMENT Right 09/08/2013   Procedure: CREATION OF RIGHT BRACHIAL CEPHALIC ARTERIOVENOUS FISTULA ;  Surgeon: Mal Misty, MD;  Location: [place];  Service: Vascular;  Laterality: Right;   [place] Right 01/26/2014   Procedure: Right Arm BASILIC VEIN TRANSPOSITION;  Surgeon: Mal Misty, MD;  Location: [place];  Service: Vascular;  Laterality: Right;   BIOPSY  09/10/2021   Procedure: BIOPSY;  Surgeon: Harvel Quale, MD;  [facility];  Service: Gastroenterology;;  gastric   BIOPSY  10/07/2021   Procedure: BIOPSY;  Surgeon: Irving Copas., MD;  Location: [facility];  Service: Gastroenterology;;   BIOPSY  04/17/2022   Procedure: BIOPSY;  Surgeon: Irving Copas., MD;  Location: [facility];  Service: Gastroenterology;;   CATARACT EXTRACTION W/PHACO  11/20/2011   Procedure: CATARACT EXTRACTION PHACO AND INTRAOCULAR LENS PLACEMENT (  [place]);  Surgeon: Tonny Branch, MD;  [facility];  Service: Ophthalmology;  Laterality: Right;  CDE 18.82   CATARACT EXTRACTION W/PHACO Left 11/18/2012   Procedure: CATARACT EXTRACTION PHACO AND INTRAOCULAR LENS PLACEMENT (IOC);  Surgeon: Tonny Branch, MD;  [facility]: [facility];  Service: Ophthalmology;  Laterality: Left;  CDE: 18.97   COLONOSCOPY N/A 02/09/2013   Procedure: COLONOSCOPY;   Surgeon: Rogene Houston, MD;  [facility];  Service: Endoscopy;  Laterality: N/A;  305-moved to 220 Ann to notify pt   COLONOSCOPY N/A 07/08/2018   Procedure: COLONOSCOPY;  Surgeon: Rogene Houston, MD;  [facility];  Service: Endoscopy;  Laterality: N/A;  930   ENDOSCOPIC MUCOSAL RESECTION N/A 10/07/2021   Procedure: ENDOSCOPIC MUCOSAL RESECTION;  Surgeon: Rush Landmark Telford Nab., MD;  [facility];  Service: Gastroenterology;  Laterality: N/A;   ESOPHAGOGASTRODUODENOSCOPY N/A 04/17/2022   Procedure: ESOPHAGOGASTRODUODENOSCOPY (EGD);  Surgeon: Irving Copas., MD;  Location: [facility];  Service: Gastroenterology;  Laterality: N/A;   ESOPHAGOGASTRODUODENOSCOPY (EGD) WITH PROPOFOL N/A 09/10/2021   Procedure: ESOPHAGOGASTRODUODENOSCOPY (EGD) WITH PROPOFOL;  Surgeon: Harvel Quale, MD;  [facility];  Service: Gastroenterology;  Laterality: N/A;   ESOPHAGOGASTRODUODENOSCOPY (EGD) WITH PROPOFOL N/A 10/07/2021   Procedure: ESOPHAGOGASTRODUODENOSCOPY (EGD) WITH PROPOFOL;  Surgeon: Rush Landmark Telford Nab., MD;  [facility];  Service: Gastroenterology;  Laterality: N/A;   EUS N/A 10/07/2021   Procedure: UPPER ENDOSCOPIC ULTRASOUND (EUS) RADIAL;  Surgeon: Irving Copas., MD;  [facility];  Service: Gastroenterology;  Laterality: N/A;   GASTRIC VARICES BANDING  10/07/2021   Procedure: duodenal tumor BANDING;  Surgeon: Rush Landmark Telford Nab., MD;  Location: [facility];  Service: Gastroenterology;;  with emr kit   HEMOSTASIS CLIP PLACEMENT  10/07/2021   Procedure: HEMOSTASIS CLIP PLACEMENT;  Surgeon: Irving Copas., MD;  Location: [facility];  Service: Gastroenterology;;   INSERTION OF DIALYSIS CATHETER Right 06/24/2013   Procedure: INSERTION OF DIALYSIS CATHETER: Ultrasound guided;  Surgeon: Serafina Mitchell, MD;  Location: [place];  Service: Vascular;  Laterality: Right;   LIGATION OF ARTERIOVENOUS  FISTULA  Right 01/26/2014   Procedure: LIGATION OF ARTERIOVENOUS  FISTULA;  Surgeon: Mal Misty, MD;  Location: [place];  Service: Vascular;  Laterality: Right;   LOOP RECORDER IMPLANT N/A 07/21/2013   Procedure: LOOP RECORDER IMPLANT;  Surgeon: Evans Lance, MD;  Location: [facility];  Service: Cardiovascular;  Laterality: N/A;   POLYPECTOMY  07/08/2018   Procedure: POLYPECTOMY;  Surgeon: Rogene Houston, MD;  [facility];  Service: Endoscopy;;  Descending colon polyps x 2    SUBMUCOSAL LIFTING INJECTION  10/07/2021   Procedure: SUBMUCOSAL LIFTING INJECTION;  Surgeon: Irving Copas., MD;  [facility];  Service: Gastroenterology;;   TEE WITHOUT CARDIOVERSION N/A 07/21/2013   Procedure: TRANSESOPHAGEAL ECHOCARDIOGRAM (TEE);  Surgeon: Dorothy Spark, MD;  Location: [place];  Service: Cardiovascular;  Laterality: N/A;   UPPER ESOPHAGEAL ENDOSCOPIC ULTRASOUND (EUS) N/A 04/17/2022   Procedure: UPPER ESOPHAGEAL ENDOSCOPIC ULTRASOUND (EUS);  Surgeon: Irving Copas., MD;  Location: [facility];  Service: Gastroenterology;  Laterality: N/A;    Social History:  reports that she has never smoked. She has never used smokeless tobacco. She reports that she does not drink alcohol and does not use drugs.   Allergies  Allergen Reactions   Ambien [Zolpidem] Other (See Comments)    Hallucinations    Reglan [Metoclopramide] Other (See Comments)    "makes me crazy"  Family History  Problem Relation Age of Onset   Cancer Sister    Cancer Brother    Anesthesia problems Neg Hx    Hypotension Neg Hx    Malignant hyperthermia Neg Hx    Pseudochol deficiency Neg Hx      Prior to Admission medications   Medication Sig Start Date End Date Taking? Authorizing Provider  albuterol (VENTOLIN HFA) 108 (90 Base) MCG/ACT inhaler Inhale 2 puffs into the lungs every 6 (six) hours as needed for wheezing or shortness of breath. 11/21/20   Gerlene Fee, NP  aspirin 325 MG  tablet Take 650 mg by mouth daily. 07/09/18   Rogene Houston, MD  BD INSULIN SYRINGE U/F 31G X 5/16" 0.3 ML MISC Inject 1 each into the skin 2 (two) times daily. as directed 11/21/20   Gerlene Fee, NP  cyanocobalamin (,VITAMIN B-12,) 1000 MCG/ML injection Inject 1,000 mcg into the muscle every 30 (thirty) days. 11/01/14   [provider]  diclofenac Sodium (VOLTAREN) 1 % GEL Apply 4 g topically 4 (four) times daily as needed. Apply to bilateral knees 03/16/21   Scot Jun, FNP  doxycycline (VIBRAMYCIN) 100 MG capsule Take 1 capsule (100 mg total) by mouth 2 (two) times daily. One po bid x 7 days 05/09/22   Milton Ferguson, MD  insulin NPH-regular Human (HUMULIN 70/30) (70-30) 100 UNIT/ML injection Inject 5-14 Units into the skin 2 (two) times daily with a meal. Patient taking differently: Inject 5-10 Units into the skin 2 (two) times daily with a meal. Take 10 units in the morning and 5 units at bedtime 02/03/22   Cassandria Anger, MD  labetalol (NORMODYNE) 200 MG tablet Take 200 mg by mouth 2 (two) times daily. 07/26/21   [provider]  lidocaine-prilocaine (EMLA) cream Apply 1 application topically every Monday, Wednesday, and Friday. 11/21/20   Gerlene Fee, NP  multivitamin (RENA-VIT) TABS tablet Take 1 tablet by mouth at bedtime. 08/21/14   Kirsteins, Luanna Salk, MD  omeprazole (PRILOSEC) 40 MG capsule TAKE ONE CAPSULE BY MOUTH TWICE DAILY BEFORE A meal 04/16/22   Mansouraty, Telford Nab., MD  pregabalin (LYRICA) 75 MG capsule Take 1 capsule (75 mg total) by mouth daily. Patient not taking: Reported on 04/15/2022 12/10/21   Suzan Slick, NP  sevelamer carbonate (RENVELA) 800 MG tablet Take 1 tablet (800 mg total) by mouth 2 (two) times daily before lunch and supper. 11/21/20   Gerlene Fee, NP  traMADol (ULTRAM) 50 MG tablet Take 1 tablet (50 mg total) by mouth every 8 (eight) hours as needed for moderate pain or severe pain. Patient not taking: Reported on  04/15/2022 11/15/21   Suzan Slick, NP    Physical Exam: BP (!) 134/57   Pulse 84   Temp 98.4 F (36.9 C) (Oral)   Resp (!) 21   Ht _0  (1.702 m)   Wt 72.9 kg   SpO2 95%   BMI 25.17 kg/m   General: 81 y.o. year-old female well developed well nourished in no acute distress.  Alert and oriented x3. HEENT: NCAT, EOMI Neck: Supple, trachea medial Cardiovascular: Regular rate and rhythm with no rubs or gallops.  No thyromegaly or JVD noted.  No lower extremity edema. 2/4 pulses in all 4 extremities. Respiratory: Decreased breath sounds in LUL on auscultation with no wheezes or rales. Good inspiratory effort. Abdomen: Soft, nontender nondistended with normal bowel sounds x4 quadrants. Muskuloskeletal: No cyanosis, clubbing or edema noted bilaterally  Neuro: CN II-XII intact, strength 5/5 x 4, sensation, reflexes intact Skin: No ulcerative lesions noted or rashes Psychiatry: Judgement and insight appear normal. Mood is appropriate for condition and setting          Labs on Admission:  Basic Metabolic Panel: Recent Labs  Lab 06/01/22 1910  NA 136  K 3.5  CL 96*  CO2 28  GLUCOSE 185*  BUN 36*  CREATININE 7.26*  CALCIUM 8.2*   Liver Function Tests: No results for input(s): "AST", "ALT", "ALKPHOS", "BILITOT", "PROT", "ALBUMIN" in the last 168 hours. No results for input(s): "LIPASE", "AMYLASE" in the last 168 hours. No results for input(s): "AMMONIA" in the last 168 hours. CBC: Recent Labs  Lab 06/01/22 1910  WBC 15.1*  NEUTROABS 12.9*  HGB 8.6*  HCT 25.8*  MCV 94.9  PLT 112*   Cardiac Enzymes: No results for input(s): "CKTOTAL", "CKMB", "CKMBINDEX", "TROPONINI" in the last 168 hours.  BNP (last 3 results) Recent Labs    05/09/22 0659  BNP 2,370.0*    ProBNP (last 3 results) No results for input(s): "PROBNP" in the last 8760 hours.  CBG: No results for input(s): "GLUCAP" in the last 168 hours.  Radiological Exams on Admission: CT Chest W  Contrast  Result Date: 06/01/2022 CLINICAL DATA:  Chest pain, abnormal chest radiograph EXAM: CT CHEST WITH CONTRAST TECHNIQUE: Multidetector CT imaging of the chest was performed during intravenous contrast administration. RADIATION DOSE REDUCTION: This exam was performed according to the departmental dose-optimization program which includes automated exposure control, adjustment of the mA and/or kV according to patient size and/or use of iterative reconstruction technique. CONTRAST:  12m OMNIPAQUE IOHEXOL 300 MG/ML  SOLN COMPARISON:  Chest/left rib radiographs dated 06/01/2022 FINDINGS: Cardiovascular: The heart is normal in size. No pericardial effusion. No evidence of thoracic aortic aneurysm. Atherosclerotic calcifications of the aortic arch. Three vessel coronary atherosclerosis. Mediastinum/Nodes: No suspicious mediastinal lymphadenopathy. Visualized thyroid is unremarkable. Lungs/Pleura: Focal patchy/sub solid opacity in the central left upper lobe, corresponding to the radiographic abnormality. Additional mild patchy opacity in the posterior right middle lobe (series 5/image 35). This appearance is compatible with multifocal pneumonia. No frank interstitial edema. Moderate left and small right pleural effusions. Associated bibasilar opacities, likely atelectasis. No pneumothorax. Upper Abdomen: Visualized upper abdomen is grossly unremarkable, noting vascular calcifications. Musculoskeletal: Mild degenerative changes of the visualized thoracolumbar spine. No rib fracture is seen. IMPRESSION: Multifocal pneumonia in the left upper lobe and right middle lobe, accounting for the radiographic abnormality. Follow-up chest radiographs are suggested in 4-6 weeks to document clearance. Moderate left and small right pleural effusions. No frank interstitial edema. No rib fracture is seen. Aortic Atherosclerosis (ICD10-I70.0). Electronically Signed   By: SJulian HyM.D.   On: 06/01/2022 23:50   DG Ribs  Unilateral W/Chest Left  Result Date: 06/01/2022 CLINICAL DATA:  Shortness of breath, cough EXAM: LEFT RIBS AND CHEST - 3+ VIEW COMPARISON:  Chest radiograph dated 05/09/2022 FINDINGS: Rounded/masslike left upper lobe opacity, new from recent prior chest radiograph, suspicious for pneumonia. Left basilar opacity, atelectasis versus pneumonia. No pleural effusion or pneumothorax. The heart is top-normal in size. No displaced left rib fracture is seen. IMPRESSION: Rounded/masslike left upper lobe opacity, new from recent chest radiograph, suspicious for pneumonia. Left basilar opacity, atelectasis versus pneumonia. At a minimum, follow-up chest radiographs are suggested in 3-4 weeks to document clearance. If the patient does not have signs/symptoms of infection, consider CT chest with contrast for further evaluation. No displaced left rib fracture is seen.  Electronically Signed   By: Julian Hy M.D.   On: 06/01/2022 19:03    EKG: I independently viewed the EKG done and my findings are as followed: EKG was not done in the ED  Assessment/Plan Present on Admission:  Multifocal pneumonia  Thrombocytopenia (HCC)  Type 2 diabetes mellitus with hyperglycemia ([place])  Essential hypertension  Gastro-esophageal reflux disease without esophagitis  Principal Problem:   Multifocal pneumonia Active Problems:   Essential hypertension   Thrombocytopenia (HCC)   Gastro-esophageal reflux disease without esophagitis   Type 2 diabetes mellitus with hyperglycemia (HCC)   Pleural effusion   End stage renal disease on dialysis (HCC)  Multifocal pneumonia POA CT chest with contrast showed multifocal pneumonia in the left upper lobe and right middle lobe accounting for radiographic abnormality. Patient was started on ceftriaxone and azithromycin, we shall continue same at this time with plan to de-escalate/discontinue based on blood culture, sputum culture, urine Legionella, strep pneumo and  procalcitonin Continue Tylenol as needed Continue Mucinex, incentive spirometry, flutter valve   Pleural effusion CT chest with contrast showed moderate left and small right pleural effusions Continue dialysis with possibility of possible improved pleural effusions  Thrombocytopenia Platelets 112, continue to monitor platelet levels  T2DM with hyperglycemia Hemoglobin A1c about 3 months ago was 8.1 Continue Semglee 8 units nightly and adjust dose accordingly Continue ISS and hypoglycemia  ESRD on HD (MWF) Continue Renvela and RENA_VIT Nephrology will be consulted for maintenance dialysis  Essential hypertension Continue labetalol  GERD Continue Protonix  DVT prophylaxis: Heparin subcu  Code Status: Full code (confirmed at bedside)  Family Communication: None at bedside  Consults: Nephrology  Severity of Illness: The appropriate patient status for this patient is INPATIENT. Inpatient status is judged to be reasonable and necessary in order to provide the required intensity of service to ensure the patient's safety. The patient's presenting symptoms, physical exam findings, and initial radiographic and laboratory data in the context of their chronic comorbidities is felt to place them at high risk for further clinical deterioration. Furthermore, it is not anticipated that the patient will be medically stable for discharge from the hospital within 2 midnights of admission.   * I certify that at the point of admission it is my clinical judgment that the patient will require inpatient hospital care spanning beyond 2 midnights from the point of admission due to high intensity of service, high risk for further deterioration and high frequency of surveillance required.*  Author: Bernadette Hoit, DO 06/02/2022 4:06 AM  For on call review [email or web address].

## 2022-06-02 NOTE — Evaluation (Signed)
Occupational Therapy Evaluation Patient Details Name: [name] MRN: [id number] DOB: 1941-04-13 Today's Date: 06/02/2022   History of Present Illness [name] is a 81 y.o. female with medical history significant of ESRD on HD (MWF), T2DM, GERD, essential hypertension who presents to the emergency department due to 1 month onset of cough productive of yellow phlegm with an associated left-sided chest soreness from frequent coughing.  She has been to her PCP and also came to the ED on 11/24 without any definite diagnosis and was discharged from the ED with doxycycline.  Family was concerned that patient might have pneumonia, so she was brought to the ED for further evaluation and management.  Patient denies fever, nausea, vomiting, abdominal pain. (per DO)   Clinical Impression   Pt agreeable to OT evaluation. Pt is assisted at home by family and reports 24/7 assist available. Pt reports she does not ambulate in the house without someone there for safety. Pt ambulated with min G assist today but needed min A at times for lowering to toilet or chair. Pt required min A for dressing LB with sings of fatigue and reports of feeling sick. Pt is generally weak and decondition, but seems to have good family support. Pt was left in bed with call bell within reach. BSC recommended to make toilet transfer easier at home by elevating the seated surface. Pt will benefit from continued OT in the hospital and recommended venue below to increase strength, balance, and endurance for safe ADL's.         Recommendations for follow up therapy are one component of a multi-disciplinary discharge planning process, led by the attending physician.  Recommendations may be updated based on patient status, additional functional criteria and insurance authorization.   Follow Up Recommendations  Home health OT     Assistance Recommended at Discharge Frequent or constant Supervision/Assistance  Patient can  return home with the following A little help with walking and/or transfers;A little help with bathing/dressing/bathroom;Assistance with cooking/housework;Assist for transportation;Help with stairs or ramp for entrance    Functional Status Assessment  Patient has had a recent decline in their functional status and demonstrates the ability to make significant improvements in function in a reasonable and predictable amount of time.  Equipment Recommendations  BSC/3in1           Precautions / Restrictions Precautions Precautions: Fall Restrictions Weight Bearing Restrictions: No      Mobility Bed Mobility Overal bed mobility: Modified Independent             General bed mobility comments: Mild labored movement and rest breaks needed, but no physical assist for bed mobility.    Transfers Overall transfer level: Needs assistance Equipment used: Rolling walker (2 wheels) Transfers: Sit to/from Stand, Bed to chair/wheelchair/BSC Sit to Stand: Min guard, Min assist     Step pivot transfers: Min guard, Min assist     General transfer comment: Min G to min A for safety mostly when returning to chair or EOB. Pt noted to plop into bed rather than slow lower herself. See toilet transfer for more details.      Balance Overall balance assessment: Needs assistance Sitting-balance support: No upper extremity supported, Feet supported Sitting balance-Leahy Scale: Good Sitting balance - Comments: fair to good seated EOB   Standing balance support: During functional activity, Bilateral upper extremity supported, Reliant on assistive device for balance Standing balance-Leahy Scale: Fair Standing balance comment: using RW  ADL either performed or assessed with clinical judgement   ADL Overall ADL's : Needs assistance/impaired     Grooming: Standing;Min guard;Supervision/safety   Upper Body Bathing: Set up;Sitting   Lower Body Bathing:  Minimal assistance;Sitting/lateral leans;Moderate assistance   Upper Body Dressing : Set up;Sitting   Lower Body Dressing: Minimal assistance;Sitting/lateral leans Lower Body Dressing Details (indicate cue type and reason): Pt requried min A only to don L LE sock. Pt able to doff and on others without assist. Toilet Transfer: Minimal assistance;Rolling walker (2 wheels);Ambulation Toilet Transfer Details (indicate cue type and reason): Pt ambulated to toilet well but needed assist to lower to low toilet. Min G to min A to stand from toilet with grab bars and RW. Toileting- Water quality scientist and Hygiene: Min guard;Minimal assistance;Sit to/from stand Toileting - Clothing Manipulation Details (indicate cue type and reason): Assist to stand from toilet and maintain safety while pt complete peri-care without physical assist.     Functional mobility during ADLs: Min guard;Supervision/safety;Rolling walker (2 wheels)       Vision Baseline Vision/History: 1 Wears glasses Ability to See in Adequate Light: 0 Adequate Patient Visual Report: No change from baseline (Pt reports her eyes water.) Vision Assessment?: No apparent visual deficits                Pertinent Vitals/Pain Pain Assessment Pain Assessment: 0-10 Pain Score: 8  Pain Location: L abdominal area and axillary area. Pain Descriptors / Indicators: Other (Comment) ("hurt pain") Pain Intervention(s): Limited activity within patient's tolerance, Monitored during session, Repositioned     Hand Dominance Right   Extremity/Trunk Assessment Upper Extremity Assessment Upper Extremity Assessment: Generalized weakness   Lower Extremity Assessment Lower Extremity Assessment: Defer to PT evaluation   Cervical / Trunk Assessment Cervical / Trunk Assessment: Normal   Communication Communication Communication: No difficulties   Cognition Arousal/Alertness: Awake/alert Behavior During Therapy: WFL for tasks  assessed/performed Overall Cognitive Status: Within Functional Limits for tasks assessed                                                        Home Living Family/patient expects to be discharged to:: Private residence Living Arrangements: Children Available Help at Discharge: Available 24 hours/day Type of Home: House Home Access: Stairs to enter [employer] of Steps: 3 Entrance Stairs-Rails: Can reach both Home Layout: One level     Bathroom Shower/Tub: Teacher, early years/pre: Standard Bathroom Accessibility: Yes How Accessible: Accessible via walker Home Equipment: Rollator (4 wheels);Shower seat;Wheelchair - manual          Prior Functioning/Environment Prior Level of Function : Needs assist       Physical Assist : Mobility (physical);ADLs (physical) Mobility (physical): Gait ADLs (physical): Bathing;Dressing;IADLs Mobility Comments: Pt reports that her family is with her when she transfers to make sure it is safe. Pt does not typically need assist to stand from bed. [place] ambulator with rollator. ADLs Comments: Pt is assisted to wash her back when bathing. Set up assist for dressing. Pt reports ablity to toilet, groom, and feed without assist. Family assist with IADL's.        OT Problem List: Decreased strength;Decreased activity tolerance;Impaired balance (sitting and/or standing)      OT Treatment/Interventions: Self-care/ADL training;Therapeutic exercise;Therapeutic activities;Patient/family education;Balance training    OT Goals(Current goals can  be found in the care plan section) Acute Rehab OT Goals Patient Stated Goal: return home OT Goal Formulation: With patient Time For Goal Achievement: 06/16/22 Potential to Achieve Goals: Good  OT Frequency: Min 1X/week                                   End of Session Equipment Utilized During Treatment: Rolling walker (2 wheels) Nurse Communication:  Mobility status  Activity Tolerance: Patient tolerated treatment well Patient left: in bed;with call bell/phone within reach  OT Visit Diagnosis: Unsteadiness on feet (R26.81);Other abnormalities of gait and mobility (R26.89);Muscle weakness (generalized) (M62.81)                Time: 2330-0762 OT Time Calculation (min): 28 min Charges:  OT General Charges $OT Visit: 1 Visit OT Evaluation $OT Eval Low Complexity: 1 Low  Seema Blum OT, MOT  Larey Seat 06/02/2022, 11:45 AM

## 2022-06-02 NOTE — Plan of Care (Signed)
  Problem: Acute Rehab OT Goals (only OT should resolve) Goal: Pt. Will Perform Grooming Flowsheets (Taken 06/02/2022 1148) Pt Will Perform Grooming:  standing  with supervision Goal: Pt. Will Perform Lower Body Dressing Flowsheets (Taken 06/02/2022 1148) Pt Will Perform Lower Body Dressing:  sitting/lateral leans  with set-up Goal: Pt. Will Transfer To Toilet Flowsheets (Taken 06/02/2022 1148) Pt Will Transfer to Toilet:  with supervision  ambulating Goal: Pt. Will Perform Toileting-Clothing Manipulation Flowsheets (Taken 06/02/2022 1148) Pt Will Perform Toileting - Clothing Manipulation and hygiene:  with set-up  sitting/lateral leans Goal: Pt/Caregiver Will Perform Home Exercise Program Flowsheets (Taken 06/02/2022 1148) Pt/caregiver will Perform Home Exercise Program:  Increased strength  Both right and left upper extremity  Independently  Jaya Lapka OT, MOT

## 2022-06-03 ENCOUNTER — Inpatient Hospital Stay (HOSPITAL_COMMUNITY): Payer: Medicare Other

## 2022-06-03 LAB — CBC
HCT: 23.1 % — ABNORMAL LOW (ref 36.0–46.0)
Hemoglobin: 7.8 g/dL — ABNORMAL LOW (ref 12.0–15.0)
MCH: 31.3 pg (ref 26.0–34.0)
MCHC: 33.8 g/dL (ref 30.0–36.0)
MCV: 92.8 fL (ref 80.0–100.0)
Platelets: 107 10*3/uL — ABNORMAL LOW (ref 150–400)
RBC: 2.49 MIL/uL — ABNORMAL LOW (ref 3.87–5.11)
RDW: 15.3 % (ref 11.5–15.5)
WBC: 11.7 10*3/uL — ABNORMAL HIGH (ref 4.0–10.5)
nRBC: 0 % (ref 0.0–0.2)

## 2022-06-03 LAB — BASIC METABOLIC PANEL
Anion gap: 13 (ref 5–15)
BUN: 21 mg/dL (ref 8–23)
CO2: 28 mmol/L (ref 22–32)
Calcium: 7.8 mg/dL — ABNORMAL LOW (ref 8.9–10.3)
Chloride: 95 mmol/L — ABNORMAL LOW (ref 98–111)
Creatinine, Ser: 4.68 mg/dL — ABNORMAL HIGH (ref 0.44–1.00)
GFR, Estimated: 9 mL/min — ABNORMAL LOW (ref >60)
Glucose, Bld: 132 mg/dL — ABNORMAL HIGH (ref 70–99)
Potassium: 3 mmol/L — ABNORMAL LOW (ref 3.5–5.1)
Sodium: 136 mmol/L (ref 135–145)

## 2022-06-03 LAB — HEMOGLOBIN A1C
Hgb A1c MFr Bld: 8.8 % — ABNORMAL HIGH (ref 4.8–5.6)
Mean Plasma Glucose: 206 mg/dL

## 2022-06-03 LAB — PROCALCITONIN: Procalcitonin: 4.16 ng/mL

## 2022-06-03 LAB — GLUCOSE, CAPILLARY
Glucose-Capillary: 126 mg/dL — ABNORMAL HIGH (ref 70–99)
Glucose-Capillary: 260 mg/dL — ABNORMAL HIGH (ref 70–99)
Glucose-Capillary: 263 mg/dL — ABNORMAL HIGH (ref 70–99)
Glucose-Capillary: 139 mg/dL — ABNORMAL HIGH (ref 70–99)

## 2022-06-03 LAB — HEPATITIS B SURFACE ANTIGEN: Hepatitis B Surface Ag: NONREACTIVE

## 2022-06-03 LAB — HEPATITIS B SURFACE ANTIBODY, QUANTITATIVE: Hep B S AB Quant (Post): 15 m[IU]/mL (ref >9.9)

## 2022-06-03 MED ORDER — AZITHROMYCIN 500 MG PO TABS: 500.0000 mg | ORAL_TABLET | Freq: Every day | ORAL | 0 refills | Status: AC

## 2022-06-03 MED ORDER — DM-GUAIFENESIN ER 30-600 MG PO TB12: 1.0000 | ORAL_TABLET | Freq: Two times a day (BID) | ORAL | 0 refills | Status: DC

## 2022-06-03 MED ORDER — TRAZODONE HCL 50 MG PO TABS
50.0000 mg | ORAL_TABLET | Freq: Once | ORAL | Status: AC
  Administered 2022-06-03: 50 mg via ORAL
  Filled 2022-06-03: qty 1

## 2022-06-03 MED ORDER — INSULIN GLARGINE-YFGN 100 UNIT/ML ~~LOC~~ SOLN
5.0000 [IU] | Freq: Every day | SUBCUTANEOUS | Status: DC
  Administered 2022-06-03: 5 [IU] via SUBCUTANEOUS
  Filled 2022-06-03 (×2): qty 0.05

## 2022-06-03 MED ORDER — CEFDINIR 300 MG PO CAPS: 300.0000 mg | ORAL_CAPSULE | Freq: Two times a day (BID) | ORAL | 0 refills | Status: AC

## 2022-06-03 MED ORDER — BENZONATATE 100 MG PO CAPS: 100.0000 mg | ORAL_CAPSULE | Freq: Three times a day (TID) | ORAL | 0 refills | Status: DC | PRN

## 2022-06-03 NOTE — Care Management Important Message (Signed)
Important Message  Patient Details  Name: [name] MRN: [id number] Date of Birth: 1941/03/07   Medicare Important Message Given:  Yes     Tommy Medal 06/03/2022, 11:53 AM

## 2022-06-03 NOTE — Progress Notes (Signed)
Patient slept well this shift. She denies any pain or discomfort at this time.

## 2022-06-03 NOTE — Plan of Care (Signed)
  Problem: Acute Rehab PT Goals(only PT should resolve) Goal: Pt Will Go Supine/Side To Sit Outcome: Progressing Flowsheets (Taken 06/03/2022 1353) Pt will go Supine/Side to Sit:  with supervision  with min guard assist Goal: Patient Will Transfer Sit To/From Stand Outcome: Progressing Flowsheets (Taken 06/03/2022 1353) Patient will transfer sit to/from stand:  with supervision  with min guard assist Goal: Pt Will Transfer Bed To Chair/Chair To Bed Outcome: Progressing Flowsheets (Taken 06/03/2022 1353) Pt will Transfer Bed to Chair/Chair to Bed:  with supervision  min guard assist Goal: Pt Will Ambulate Outcome: Progressing Flowsheets (Taken 06/03/2022 1353) Pt will Ambulate:  50 feet  with min guard assist  with supervision  with rolling walker   1:53 PM, 06/03/22 Lonell Grandchild, MPT Physical Therapist with [facility] [address] [phone] office [phone] mobile phone

## 2022-06-03 NOTE — Discharge Summary (Signed)
Physician Discharge Summary  [name] LDJ:[id number] DOB: August 16, 1940 DOA: 06/01/2022  PCP: [name], MD  Admit date: 06/01/2022  Discharge date: 06/03/2022  Admitted From:Home  Disposition:  Home  Recommendations for Outpatient Follow-up:  Follow up with PCP in 1-2 weeks Continue on Omnicef and azithromycin as prescribed for 5 more days to complete course of treatment for multifocal pneumonia Continue antitussives as prescribed Continue routine hemodialysis per schedule resuming on 12/20 Continue other home medications as prior  Home Health: None  Equipment/Devices: None  Discharge Condition:Stable  CODE STATUS: Full  Diet recommendation: Heart Healthy/carb modified  Brief/Interim Summary: [name] is a 81 y.o. female with past medical history significant for ESRD on HD MWF, DM 2, GERD, HTN who presented to [facility] ED on 12/17 from home with complaint of productive cough of yellow/green phlegm associated with left-sided chest soreness from frequent coughing.  Patient has previously seen her PCP and other ED visit and discharged with doxycycline.  Family is concerned that she may have underlying pneumonia so she was brought to the ED for further evaluation and management.   -Patient was admitted with multifocal pneumonia and was started empirically on IV Rocephin and azithromycin with significant improvement noted.  She has no further shortness of breath and states that her cough has improved as well as her leukocytosis.  She is noted to have bilateral pleural effusions with no difficulty with dyspnea.  She underwent hemodialysis on 12/18 and will undergo further hemodialysis as scheduled on 12/20.  She may transition to oral antibiotics and is now stable for discharge with close follow-up recommended with her PCP.  Discharge Diagnoses:  Principal Problem:   Multifocal pneumonia Active Problems:   Essential hypertension   Thrombocytopenia (HCC)    Gastro-esophageal reflux disease without esophagitis   Type 2 diabetes mellitus with hyperglycemia (HCC)   Pleural effusion   End stage renal disease on dialysis [facility])  Principal discharge diagnosis: Multifocal community-acquired pneumonia with associated pleural effusions.  Discharge Instructions  Discharge Instructions     Diet - low sodium heart healthy   Complete by: As directed    Increase activity slowly   Complete by: As directed       Allergies as of 06/03/2022       Reactions   Ambien [zolpidem] Other (See Comments)   Hallucinations    Reglan [metoclopramide] Other (See Comments)   "makes me crazy"        Medication List     TAKE these medications    albuterol 108 (90 Base) MCG/ACT inhaler Commonly known as: VENTOLIN HFA Inhale 2 puffs into the lungs every 6 (six) hours as needed for wheezing or shortness of breath.   aspirin 325 MG tablet Take 650 mg by mouth daily.   azithromycin 500 MG tablet Commonly known as: Zithromax Take 1 tablet (500 mg total) by mouth daily for 5 days. Take 1 tablet daily for 3 days.   BD Insulin Syringe U/F 31G X 5/16" 0.3 ML Misc Generic drug: Insulin Syringe-Needle U-100 Inject 1 each into the skin 2 (two) times daily. as directed   benzonatate 100 MG capsule Commonly known as: TESSALON Take 1 capsule (100 mg total) by mouth 3 (three) times daily as needed for cough.   cefdinir 300 MG capsule Commonly known as: OMNICEF Take 1 capsule (300 mg total) by mouth 2 (two) times daily for 5 days.   cyanocobalamin 1000 MCG/ML injection Commonly known as: VITAMIN B12 Inject 1,000 mcg into the  muscle every 30 (thirty) days.   dextromethorphan-guaiFENesin 30-600 MG 12hr tablet Commonly known as: MUCINEX DM Take 1 tablet by mouth 2 (two) times daily for 7 days.   diclofenac Sodium 1 % Gel Commonly known as: Voltaren Apply 4 g topically 4 (four) times daily as needed. Apply to bilateral knees   HumuLIN 70/30 (70-30) 100  UNIT/ML injection Generic drug: insulin NPH-regular Human Inject 5-14 Units into the skin 2 (two) times daily with a meal. What changed:  how much to take additional instructions   labetalol 200 MG tablet Commonly known as: NORMODYNE Take 200 mg by mouth 2 (two) times daily.   lidocaine-prilocaine cream Commonly known as: EMLA Apply 1 application topically every Monday, Wednesday, and Friday.   multivitamin Tabs tablet Take 1 tablet by mouth at bedtime.   omeprazole 40 MG capsule Commonly known as: PRILOSEC TAKE ONE CAPSULE BY MOUTH TWICE DAILY BEFORE A meal What changed: See the new instructions.   sevelamer carbonate 800 MG tablet Commonly known as: RENVELA Take 1 tablet (800 mg total) by mouth 2 (two) times daily before lunch and supper.               Durable Medical Equipment  (From admission, onward)           Start     Ordered   06/02/22 1150  For home use only DME 3 n 1  Once        06/02/22 1150            Follow-up Information     [name], MD. Schedule an appointment as soon as possible for a visit in 1 week(s).   Specialty: Family Medicine Contact information: [address] [place] [address] [phone]                Allergies  Allergen Reactions   Ambien [Zolpidem] Other (See Comments)    Hallucinations    Reglan [Metoclopramide] Other (See Comments)    "makes me crazy"    Consultations: Nephrology   Procedures/Studies: CT Chest W Contrast  Result Date: 06/01/2022 CLINICAL DATA:  Chest pain, abnormal chest radiograph EXAM: CT CHEST WITH CONTRAST TECHNIQUE: Multidetector CT imaging of the chest was performed during intravenous contrast administration. RADIATION DOSE REDUCTION: This exam was performed according to the departmental dose-optimization program which includes automated exposure control, adjustment of the mA and/or kV according to patient size and/or use of iterative reconstruction technique.  CONTRAST:  77m OMNIPAQUE IOHEXOL 300 MG/ML  SOLN COMPARISON:  Chest/left rib radiographs dated 06/01/2022 FINDINGS: Cardiovascular: The heart is normal in size. No pericardial effusion. No evidence of thoracic aortic aneurysm. Atherosclerotic calcifications of the aortic arch. Three vessel coronary atherosclerosis. Mediastinum/Nodes: No suspicious mediastinal lymphadenopathy. Visualized thyroid is unremarkable. Lungs/Pleura: Focal patchy/sub solid opacity in the central left upper lobe, corresponding to the radiographic abnormality. Additional mild patchy opacity in the posterior right middle lobe (series 5/image 35). This appearance is compatible with multifocal pneumonia. No frank interstitial edema. Moderate left and small right pleural effusions. Associated bibasilar opacities, likely atelectasis. No pneumothorax. Upper Abdomen: Visualized upper abdomen is grossly unremarkable, noting vascular calcifications. Musculoskeletal: Mild degenerative changes of the visualized thoracolumbar spine. No rib fracture is seen. IMPRESSION: Multifocal pneumonia in the left upper lobe and right middle lobe, accounting for the radiographic abnormality. Follow-up chest radiographs are suggested in 4-6 weeks to document clearance. Moderate left and small right pleural effusions. No frank interstitial edema. No rib fracture is seen. Aortic Atherosclerosis (  ICD10-I70.0). Electronically Signed   By: Julian Hy M.D.   On: 06/01/2022 23:50   DG Ribs Unilateral W/Chest Left  Result Date: 06/01/2022 CLINICAL DATA:  Shortness of breath, cough EXAM: LEFT RIBS AND CHEST - 3+ VIEW COMPARISON:  Chest radiograph dated 05/09/2022 FINDINGS: Rounded/masslike left upper lobe opacity, new from recent prior chest radiograph, suspicious for pneumonia. Left basilar opacity, atelectasis versus pneumonia. No pleural effusion or pneumothorax. The heart is top-normal in size. No displaced left rib fracture is seen. IMPRESSION:  Rounded/masslike left upper lobe opacity, new from recent chest radiograph, suspicious for pneumonia. Left basilar opacity, atelectasis versus pneumonia. At a minimum, follow-up chest radiographs are suggested in 3-4 weeks to document clearance. If the patient does not have signs/symptoms of infection, consider CT chest with contrast for further evaluation. No displaced left rib fracture is seen. Electronically Signed   By: Julian Hy M.D.   On: 06/01/2022 19:03   DG Chest Port 1 View  Result Date: 05/09/2022 CLINICAL DATA:  Shortness of breath. EXAM: PORTABLE CHEST 1 VIEW COMPARISON:  05/05/2022 FINDINGS: Unchanged cardiac enlargement. Pulmonary vascular congestion. No pleural effusion or edema identified. Visualized osseous structures appear intact. IMPRESSION: Cardiac enlargement and pulmonary vascular congestion. Electronically Signed   By: Kerby Moors M.D.   On: 05/09/2022 07:15   DG Chest 2 View  Result Date: 05/05/2022 CLINICAL DATA:  Cough, wheezing. EXAM: CHEST - 2 VIEW COMPARISON:  May 01, 2022. FINDINGS: Stable cardiomegaly. Mild central pulmonary vascular congestion is noted. Minimal bibasilar pulmonary edema is noted which is improved compared to prior exam. Bony thorax is unremarkable. IMPRESSION: Stable cardiomegaly and central pulmonary vascular congestion, but improved bilateral pulmonary edema. Electronically Signed   By: Marijo Conception M.D.   On: 05/05/2022 11:55     Discharge Exam: Vitals:   06/02/22 2115 06/03/22 0556  BP: (!) 148/62 (!) 111/51  Pulse: 70 79  Resp: 16 16  Temp: 98.2 F (36.8 C) 98.5 F (36.9 C)  SpO2: 100% 100%   Vitals:   06/02/22 2030 06/02/22 2055 06/02/22 2115 06/03/22 0556  BP: (!) 149/56 (!) 140/59 (!) 148/62 (!) 111/51  Pulse: 70 71 70 79  Resp: '16 15 16 16  '$ Temp:   98.2 F (36.8 C) 98.5 F (36.9 C)  TempSrc:   Oral Oral  SpO2:   100% 100%  Weight:      Height:        General: Pt is alert, awake, not in acute  distress Cardiovascular: RRR, S1/S2 +, no rubs, no gallops Respiratory: CTA bilaterally, no wheezing, no rhonchi Abdominal: Soft, NT, ND, bowel sounds + Extremities: no edema, no cyanosis    The results of significant diagnostics from this hospitalization (including imaging, microbiology, ancillary and laboratory) are listed below for reference.     Microbiology: Recent Results (from the past 240 hour(s))  Resp panel by RT-PCR (RSV, Flu A&B, Covid) Anterior Nasal Swab     Status: None   Collection Time: 06/01/22  8:03 PM   Specimen: Anterior Nasal Swab  Result Value Ref Range Status   SARS Coronavirus 2 by RT PCR NEGATIVE NEGATIVE Final    Comment: (NOTE) SARS-CoV-2 target nucleic acids are NOT DETECTED.  The SARS-CoV-2 RNA is generally detectable in upper respiratory specimens during the acute phase of infection. The lowest concentration of SARS-CoV-2 viral copies this assay can detect is 138 copies/mL. A negative result does not preclude SARS-Cov-2 infection and should not be used as the sole basis for treatment  or other patient management decisions. A negative result may occur with  improper specimen collection/handling, submission of specimen other than nasopharyngeal swab, presence of viral mutation(s) within the areas targeted by this assay, and inadequate number of viral copies(<138 copies/mL). A negative result must be combined with clinical observations, patient history, and epidemiological information. The expected result is Negative.  Fact Sheet for Patients:  EntrepreneurPulse.com.au  Fact Sheet for Healthcare Providers:  IncredibleEmployment.be  This test is no t yet approved or cleared by the [place] FDA and  has been authorized for detection and/or diagnosis of SARS-CoV-2 by FDA under an Emergency Use Authorization (EUA). This EUA will remain  in effect (meaning this test can be used) for the duration of the COVID-19  declaration under Section 564(b)(1) of the Act, 21 U.S.C.section 360bbb-3(b)(1), unless the authorization is terminated  or revoked sooner.       Influenza A by PCR NEGATIVE NEGATIVE Final   Influenza B by PCR NEGATIVE NEGATIVE Final    Comment: (NOTE) The Xpert Xpress SARS-CoV-2/FLU/RSV plus assay is intended as an aid in the diagnosis of influenza from Nasopharyngeal swab specimens and should not be used as a sole basis for treatment. Nasal washings and aspirates are unacceptable for Xpert Xpress SARS-CoV-2/FLU/RSV testing.  Fact Sheet for Patients: EntrepreneurPulse.com.au  Fact Sheet for Healthcare Providers: IncredibleEmployment.be  This test is not yet approved or cleared by the [place] FDA and has been authorized for detection and/or diagnosis of SARS-CoV-2 by FDA under an Emergency Use Authorization (EUA). This EUA will remain in effect (meaning this test can be used) for the duration of the COVID-19 declaration under Section 564(b)(1) of the Act, 21 U.S.C. section 360bbb-3(b)(1), unless the authorization is terminated or revoked.     Resp Syncytial Virus by PCR NEGATIVE NEGATIVE Final    Comment: (NOTE) Fact Sheet for Patients: EntrepreneurPulse.com.au  Fact Sheet for Healthcare Providers: IncredibleEmployment.be  This test is not yet approved or cleared by the [place] FDA and has been authorized for detection and/or diagnosis of SARS-CoV-2 by FDA under an Emergency Use Authorization (EUA). This EUA will remain in effect (meaning this test can be used) for the duration of the COVID-19 declaration under Section 564(b)(1) of the Act, 21 U.S.C. section 360bbb-3(b)(1), unless the authorization is terminated or revoked.  Performed at [facility], [address] [place], [place] [address]   Blood culture (routine x 2)     Status: None (Preliminary result)   Collection Time: 06/02/22  12:37 AM   Specimen: BLOOD LEFT HAND  Result Value Ref Range Status   Specimen Description BLOOD LEFT HAND BOTTLES DRAWN AEROBIC ONLY  Final   Special Requests   Final    Blood Culture adequate volume Performed at [facility], [address], [place], [place] [address]    Culture PENDING  Incomplete   Report Status PENDING  Incomplete  Blood culture (routine x 2)     Status: None (Preliminary result)   Collection Time: 06/02/22 12:44 AM   Specimen: BLOOD LEFT ARM  Result Value Ref Range Status   Specimen Description BLOOD LEFT ARM BOTTLES DRAWN AEROBIC AND ANAEROBIC  Final   Special Requests   Final    Blood Culture adequate volume Performed at [facility], [address] [place], [place] [address]    Culture PENDING  Incomplete   Report Status PENDING  Incomplete     Labs: BNP (last 3 results) Recent Labs    05/09/22 0659  BNP 2,370.0*  Basic Metabolic Panel: Recent Labs  Lab 06/01/22 1910 06/02/22 0740 06/03/22 0329  NA 136 137 136  K 3.5 4.3 3.0*  CL 96* 96* 95*  CO2 '28 28 28  '$ GLUCOSE 185* 220* 132*  BUN 36* 41* 21  CREATININE 7.26* 8.11* 4.68*  CALCIUM 8.2* 8.0* 7.8*  MG  --  2.2  --   PHOS  --  4.6  --    Liver Function Tests: Recent Labs  Lab 06/02/22 0740  AST 16  ALT 11  ALKPHOS 79  BILITOT 0.8  PROT 6.7  ALBUMIN 3.0*   No results for input(s): "LIPASE", "AMYLASE" in the last 168 hours. No results for input(s): "AMMONIA" in the last 168 hours. CBC: Recent Labs  Lab 06/01/22 1910 06/02/22 0740 06/03/22 0329  WBC 15.1* 18.9* 11.7*  NEUTROABS 12.9*  --   --   HGB 8.6* 9.0* 7.8*  HCT 25.8* 27.1* 23.1*  MCV 94.9 94.8 92.8  PLT 112* 119* 107*   Cardiac Enzymes: No results for input(s): "CKTOTAL", "CKMB", "CKMBINDEX", "TROPONINI" in the last 168 hours. BNP: Invalid input(s): "POCBNP" CBG: Recent Labs  Lab 06/02/22 0755 06/02/22 1208 06/02/22 1607 06/02/22 2143 06/03/22 0707  GLUCAP 208* 252* 237* 73 126*   D-Dimer No results  for input(s): "DDIMER" in the last 72 hours. Hgb A1c Recent Labs    06/02/22 0740  HGBA1C 8.8*   Lipid Profile No results for input(s): "CHOL", "HDL", "LDLCALC", "TRIG", "CHOLHDL", "LDLDIRECT" in the last 72 hours. Thyroid function studies No results for input(s): "TSH", "T4TOTAL", "T3FREE", "THYROIDAB" in the last 72 hours.  Invalid input(s): "FREET3" Anemia work up No results for input(s): "VITAMINB12", "FOLATE", "FERRITIN", "TIBC", "IRON", "RETICCTPCT" in the last 72 hours. Urinalysis    Component Value Date/Time   COLORURINE YELLOW 10/15/2017 2244   APPEARANCEUR HAZY (A) 10/15/2017 2244   LABSPEC 1.015 10/15/2017 2244   PHURINE 7.0 10/15/2017 2244   GLUCOSEU >=500 (A) 10/15/2017 2244   HGBUR SMALL (A) 10/15/2017 2244   BILIRUBINUR NEGATIVE 10/15/2017 2244   KETONESUR NEGATIVE 10/15/2017 2244   PROTEINUR 100 (A) 10/15/2017 2244   UROBILINOGEN 0.2 11/19/2014 0909   NITRITE NEGATIVE 10/15/2017 2244   LEUKOCYTESUR MODERATE (A) 10/15/2017 2244   Sepsis Labs Recent Labs  Lab 06/01/22 1910 06/02/22 0740 06/03/22 0329  WBC 15.1* 18.9* 11.7*   Microbiology Recent Results (from the past 240 hour(s))  Resp panel by RT-PCR (RSV, Flu A&B, Covid) Anterior Nasal Swab     Status: None   Collection Time: 06/01/22  8:03 PM   Specimen: Anterior Nasal Swab  Result Value Ref Range Status   SARS Coronavirus 2 by RT PCR NEGATIVE NEGATIVE Final    Comment: (NOTE) SARS-CoV-2 target nucleic acids are NOT DETECTED.  The SARS-CoV-2 RNA is generally detectable in upper respiratory specimens during the acute phase of infection. The lowest concentration of SARS-CoV-2 viral copies this assay can detect is 138 copies/mL. A negative result does not preclude SARS-Cov-2 infection and should not be used as the sole basis for treatment or other patient management decisions. A negative result may occur with  improper specimen collection/handling, submission of specimen other than nasopharyngeal  swab, presence of viral mutation(s) within the areas targeted by this assay, and inadequate number of viral copies(<138 copies/mL). A negative result must be combined with clinical observations, patient history, and epidemiological information. The expected result is Negative.  Fact Sheet for Patients:  EntrepreneurPulse.com.au  Fact Sheet for Healthcare Providers:  IncredibleEmployment.be  This test is no t yet  approved or cleared by the [place] and  has been authorized for detection and/or diagnosis of SARS-CoV-2 by FDA under an Emergency Use Authorization (EUA). This EUA will remain  in effect (meaning this test can be used) for the duration of the COVID-19 declaration under Section 564(b)(1) of the Act, 21 U.S.C.section 360bbb-3(b)(1), unless the authorization is terminated  or revoked sooner.       Influenza A by PCR NEGATIVE NEGATIVE Final   Influenza B by PCR NEGATIVE NEGATIVE Final    Comment: (NOTE) The Xpert Xpress SARS-CoV-2/FLU/RSV plus assay is intended as an aid in the diagnosis of influenza from Nasopharyngeal swab specimens and should not be used as a sole basis for treatment. Nasal washings and aspirates are unacceptable for Xpert Xpress SARS-CoV-2/FLU/RSV testing.  Fact Sheet for Patients: EntrepreneurPulse.com.au  Fact Sheet for Healthcare Providers: IncredibleEmployment.be  This test is not yet approved or cleared by the [place] FDA and has been authorized for detection and/or diagnosis of SARS-CoV-2 by FDA under an Emergency Use Authorization (EUA). This EUA will remain in effect (meaning this test can be used) for the duration of the COVID-19 declaration under Section 564(b)(1) of the Act, 21 U.S.C. section 360bbb-3(b)(1), unless the authorization is terminated or revoked.     Resp Syncytial Virus by PCR NEGATIVE NEGATIVE Final    Comment: (NOTE) Fact Sheet for  Patients: EntrepreneurPulse.com.au  Fact Sheet for Healthcare Providers: IncredibleEmployment.be  This test is not yet approved or cleared by the [place] FDA and has been authorized for detection and/or diagnosis of SARS-CoV-2 by FDA under an Emergency Use Authorization (EUA). This EUA will remain in effect (meaning this test can be used) for the duration of the COVID-19 declaration under Section 564(b)(1) of the Act, 21 U.S.C. section 360bbb-3(b)(1), unless the authorization is terminated or revoked.  Performed at [facility], [address], [place], [place] [address]   Blood culture (routine x 2)     Status: None (Preliminary result)   Collection Time: 06/02/22 12:37 AM   Specimen: BLOOD LEFT HAND  Result Value Ref Range Status   Specimen Description BLOOD LEFT HAND BOTTLES DRAWN AEROBIC ONLY  Final   Special Requests   Final    Blood Culture adequate volume Performed at [facility], [address] [place], [place] [address]    Culture PENDING  Incomplete   Report Status PENDING  Incomplete  Blood culture (routine x 2)     Status: None (Preliminary result)   Collection Time: 06/02/22 12:44 AM   Specimen: BLOOD LEFT ARM  Result Value Ref Range Status   Specimen Description BLOOD LEFT ARM BOTTLES DRAWN AEROBIC AND ANAEROBIC  Final   Special Requests   Final    Blood Culture adequate volume Performed at [facility], [address] [place], [place] [address]    Culture PENDING  Incomplete   Report Status PENDING  Incomplete     Time coordinating discharge: 35 minutes  SIGNED:   Rodena Goldmann, DO [facility] 06/03/2022, 10:09 AM  If 7PM-7AM, please contact night-coverage [email or web address]

## 2022-06-03 NOTE — Progress Notes (Signed)
Pt and family do not feel that pt is  not ready for discharge, MD notified and instructed to hold off on discharge.

## 2022-06-03 NOTE — Progress Notes (Addendum)
[facility] Progress Note    Assessment/ Plan:   1 PNA-  per primary. 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 Hgb 9 yesterday, down to 7.8 today. Would recommend at least ruling out bleed? Will defer to primary service 5. 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 PO4 12/18 acceptable  Dialyzes at [facility] MWF  4 hours  EDW 75.5 ( getting to it and cramping-  got to 73 on 11/24 when here) . HD Bath 2K, Dialyzer unknown, Heparin no. Access AVF right-  has post bleeding a lot.  Calcitriol 1 mcg q tx  Subjective:   Patient seen and examined bedside. No acute events. Pt reports that she tolerated HD yesterday with net uf 1.7L. Only complaint is ongoing cough but she does report that she feels as if her breathing is slightly better   Objective:   BP (!) 111/51 (BP Location: Left Arm)   Pulse 79   Temp 98.5 F (36.9 C) (Oral)   Resp 16   Ht '5\' 7"'$  (1.702 m)   Wt 73.1 kg   SpO2 100%   BMI 25.24 kg/m   Intake/Output Summary (Last 24 hours) at 06/03/2022 5732 Last data filed at 06/03/2022 0600 Gross per 24 hour  Intake 590 ml  Output --  Net 590 ml   Weight change: 0.2 kg  Physical Exam: Gen:NAD, sitting up in bed CVS:RRR Resp:normal wob, dec'd breath sounds bibasilar KGU:RKYH, nt/nd Ext:no sig edema Neuro: awake, alert Dialysis access: RUE AVF +b/t  Imaging: CT Chest W Contrast  Result Date: 06/01/2022 CLINICAL DATA:  Chest pain, abnormal chest radiograph EXAM: CT CHEST WITH CONTRAST TECHNIQUE: Multidetector CT imaging of the chest was performed during intravenous contrast administration. RADIATION DOSE REDUCTION: This exam was performed according to the departmental dose-optimization program which includes automated exposure control, adjustment of the mA and/or kV according to patient size  and/or use of iterative reconstruction technique. CONTRAST:  17m OMNIPAQUE IOHEXOL 300 MG/ML  SOLN COMPARISON:  Chest/left rib radiographs dated 06/01/2022 FINDINGS: Cardiovascular: The heart is normal in size. No pericardial effusion. No evidence of thoracic aortic aneurysm. Atherosclerotic calcifications of the aortic arch. Three vessel coronary atherosclerosis. Mediastinum/Nodes: No suspicious mediastinal lymphadenopathy. Visualized thyroid is unremarkable. Lungs/Pleura: Focal patchy/sub solid opacity in the central left upper lobe, corresponding to the radiographic abnormality. Additional mild patchy opacity in the posterior right middle lobe (series 5/image 35). This appearance is compatible with multifocal pneumonia. No frank interstitial edema. Moderate left and small right pleural effusions. Associated bibasilar opacities, likely atelectasis. No pneumothorax. Upper Abdomen: Visualized upper abdomen is grossly unremarkable, noting vascular calcifications. Musculoskeletal: Mild degenerative changes of the visualized thoracolumbar spine. No rib fracture is seen. IMPRESSION: Multifocal pneumonia in the left upper lobe and right middle lobe, accounting for the radiographic abnormality. Follow-up chest radiographs are suggested in 4-6 weeks to document clearance. Moderate left and small right pleural effusions. No frank interstitial edema. No rib fracture is seen. Aortic Atherosclerosis (ICD10-I70.0). Electronically Signed   By: SJulian HyM.D.   On: 06/01/2022 23:50   DG Ribs Unilateral W/Chest Left  Result Date: 06/01/2022 CLINICAL DATA:  Shortness of breath, cough EXAM: LEFT RIBS AND CHEST - 3+ VIEW COMPARISON:  Chest radiograph dated 05/09/2022 FINDINGS: Rounded/masslike left upper lobe opacity, new from recent prior chest radiograph, suspicious for pneumonia. Left basilar opacity, atelectasis versus  pneumonia. No pleural effusion or pneumothorax. The heart is top-normal in size. No displaced left  rib fracture is seen. IMPRESSION: Rounded/masslike left upper lobe opacity, new from recent chest radiograph, suspicious for pneumonia. Left basilar opacity, atelectasis versus pneumonia. At a minimum, follow-up chest radiographs are suggested in 3-4 weeks to document clearance. If the patient does not have signs/symptoms of infection, consider CT chest with contrast for further evaluation. No displaced left rib fracture is seen. Electronically Signed   By: Julian Hy M.D.   On: 06/01/2022 19:03    Labs: BMET Recent Labs  Lab 06/01/22 1910 06/02/22 0740 06/03/22 0329  NA 136 137 136  K 3.5 4.3 3.0*  CL 96* 96* 95*  CO2 '28 28 28  '$ GLUCOSE 185* 220* 132*  BUN 36* 41* 21  CREATININE 7.26* 8.11* 4.68*  CALCIUM 8.2* 8.0* 7.8*  PHOS  --  4.6  --    CBC Recent Labs  Lab 06/01/22 1910 06/02/22 0740 06/03/22 0329  WBC 15.1* 18.9* 11.7*  NEUTROABS 12.9*  --   --   HGB 8.6* 9.0* 7.8*  HCT 25.8* 27.1* 23.1*  MCV 94.9 94.8 92.8  PLT 112* 119* 107*    Medications:     calcitRIOL  1 mcg Oral Q M,W,F-HD   Chlorhexidine Gluconate Cloth  6 each Topical Q0600   darbepoetin (ARANESP) injection - DIALYSIS  150 mcg Subcutaneous Q Mon-1800   dextromethorphan-guaiFENesin  1 tablet Oral BID   heparin  5,000 Units Subcutaneous Q8H   insulin aspart  0-5 Units Subcutaneous QHS   insulin aspart  0-6 Units Subcutaneous TID WC   insulin glargine-yfgn  8 Units Subcutaneous QHS   labetalol  200 mg Oral BID   multivitamin  1 tablet Oral QHS   pantoprazole  80 mg Oral Daily   sevelamer carbonate  800 mg Oral BID AC      Gean Quint, MD [facility] 06/03/2022, 8:06 AM

## 2022-06-03 NOTE — Evaluation (Signed)
Physical Therapy Evaluation Patient Details Name: [name] MRN: [id number] DOB: 05/02/1941 Today's Date: 06/03/2022  History of Present Illness  LEYLANY NORED is a 81 y.o. female with medical history significant of ESRD on HD (MWF), T2DM, GERD, essential hypertension who presents to the emergency department due to 1 month onset of cough productive of yellow phlegm with an associated left-sided chest soreness from frequent coughing.  She has been to her PCP and also came to the ED on 11/24 without any definite diagnosis and was discharged from the ED with doxycycline.  Family was concerned that patient might have pneumonia, so she was brought to the ED for further evaluation and management.  Patient denies fever, nausea, vomiting, abdominal pain.   Clinical Impression  Patient demonstrates slow labored movement for sitting up at bedside requiring Min guard/Min assist, slightly unsteady upon standing requiring use of RW for safety and able to ambulate into hallway without loss of balance, but limited mostly due to c/o fatigue and chest discomfort mostly likely from frequent coughing.  Patient tolerated sitting up in chair after therapy with hear daughter present in room.  Patient will benefit from continued skilled physical therapy in hospital and recommended venue below to increase strength, balance, endurance for safe ADLs and gait.          Recommendations for follow up therapy are one component of a multi-disciplinary discharge planning process, led by the attending physician.  Recommendations may be updated based on patient status, additional functional criteria and insurance authorization.  Follow Up Recommendations Home health PT      Assistance Recommended at Discharge Set up Supervision/Assistance  Patient can return home with the following  A little help with walking and/or transfers;A little help with bathing/dressing/bathroom;Help with stairs or ramp for  entrance;Assistance with cooking/housework    Equipment Recommendations None recommended by PT  Recommendations for Other Services       Functional Status Assessment Patient has had a recent decline in their functional status and demonstrates the ability to make significant improvements in function in a reasonable and predictable amount of time.     Precautions / Restrictions Precautions Precautions: Fall Restrictions Weight Bearing Restrictions: No      Mobility  Bed Mobility Overal bed mobility: Needs Assistance Bed Mobility: Supine to Sit     Supine to sit: Min guard, Min assist     General bed mobility comments: increased time, labored movement    Transfers Overall transfer level: Needs assistance Equipment used: Rolling walker (2 wheels) Transfers: Sit to/from Stand, Bed to chair/wheelchair/BSC Sit to Stand: Min guard, Min assist   Step pivot transfers: Min guard, Min assist       General transfer comment: slow labored movement    Ambulation/Gait Ambulation/Gait assistance: Min guard, Min assist Gait Distance (Feet): 30 Feet Assistive device: Rolling walker (2 wheels) Gait Pattern/deviations: Decreased step length - right, Decreased step length - left, Decreased stride length Gait velocity: decreased     General Gait Details: slow labored cadence without loss of balance, limited mostly due to fatigue and c/o chest discomfort  Stairs            Wheelchair Mobility    Modified Rankin (Stroke Patients Only)       Balance Overall balance assessment: Needs assistance Sitting-balance support: Feet supported, No upper extremity supported Sitting balance-Leahy Scale: Good Sitting balance - Comments: seated at EOB   Standing balance support: During functional activity, Bilateral upper extremity supported, Reliant on assistive device  for balance Standing balance-Leahy Scale: Fair Standing balance comment: using RW                              Pertinent Vitals/Pain Pain Assessment Pain Assessment: Faces Faces Pain Scale: Hurts little more Pain Location: chest pain due to coughing Pain Descriptors / Indicators: Grimacing, Sore, Discomfort Pain Intervention(s): Limited activity within patient's tolerance, Monitored during session, Repositioned    Home Living Family/patient expects to be discharged to:: Private residence Living Arrangements: Children Available Help at Discharge: Available 24 hours/day Type of Home: House Home Access: Stairs to enter Entrance Stairs-Rails: Can reach both Entrance Stairs-Number of Steps: 3   Home Layout: One level Home Equipment: Rollator (4 wheels);Shower seat;Wheelchair - manual      Prior Function Prior Level of Function : Needs assist       Physical Assist : Mobility (physical);ADLs (physical) Mobility (physical): Bed mobility;Transfers;Stairs;Gait   Mobility Comments: Pt reports that her family is with her when she transfers to make sure it is safe. Pt does not typically need assist to stand from bed. [place] ambulator with rollator. ADLs Comments: Pt is assisted to wash her back when bathing. Set up assist for dressing. Pt reports ablity to toilet, groom, and feed without assist. Family assist with IADL's.     Hand Dominance   Dominant Hand: Right    Extremity/Trunk Assessment   Upper Extremity Assessment Upper Extremity Assessment: Defer to OT evaluation    Lower Extremity Assessment Lower Extremity Assessment: Generalized weakness    Cervical / Trunk Assessment Cervical / Trunk Assessment: Normal  Communication   Communication: No difficulties  Cognition Arousal/Alertness: Awake/alert Behavior During Therapy: WFL for tasks assessed/performed Overall Cognitive Status: Within Functional Limits for tasks assessed                                          General Comments      Exercises     Assessment/Plan    PT Assessment Patient  needs continued PT services  PT Problem List Decreased strength;Decreased activity tolerance;Decreased balance;Decreased mobility       PT Treatment Interventions DME instruction;Gait training;Stair training;Functional mobility training;Therapeutic activities;Therapeutic exercise;Patient/family education;Balance training    PT Goals (Current goals can be found in the Care Plan section)  Acute Rehab PT Goals Patient Stated Goal: return home with family to assist PT Goal Formulation: With patient/family Time For Goal Achievement: 06/06/22 Potential to Achieve Goals: Good    Frequency Min 3X/week     Co-evaluation               AM-PAC PT "6 Clicks" Mobility  Outcome Measure Help needed turning from your back to your side while in a flat bed without using bedrails?: A Little Help needed moving from lying on your back to sitting on the side of a flat bed without using bedrails?: A Little Help needed moving to and from a bed to a chair (including a wheelchair)?: A Little Help needed standing up from a chair using your arms (e.g., wheelchair or bedside chair)?: A Little Help needed to walk in hospital room?: A Little Help needed climbing 3-5 steps with a railing? : A Lot 6 Click Score: 17    End of Session   Activity Tolerance: Patient tolerated treatment well;Patient limited by fatigue;Patient limited by pain Patient left: in chair;with  call bell/phone within reach;with family/visitor present Nurse Communication: Mobility status PT Visit Diagnosis: Unsteadiness on feet (R26.81);Other abnormalities of gait and mobility (R26.89);Muscle weakness (generalized) (M62.81)    Time: 4818-5631 PT Time Calculation (min) (ACUTE ONLY): 20 min   Charges:   PT Evaluation $PT Eval Moderate Complexity: 1 Mod PT Treatments $Therapeutic Activity: 8-22 mins        1:51 PM, 06/03/22 Lonell Grandchild, MPT Physical Therapist with [facility] [phone] office [phone]  mobile phone

## 2022-06-03 NOTE — Progress Notes (Signed)
Inpatient Diabetes Program Recommendations  AACE/ADA: New Consensus Statement on Inpatient Glycemic Control   Target Ranges:  Prepandial:   less than 140 mg/dL      Peak postprandial:   less than 180 mg/dL (1-2 hours)      Critically ill patients:  140 - 180 mg/dL    Latest Reference Range & Units 06/02/22 07:55 06/02/22 12:08 06/02/22 16:07 06/02/22 21:43 06/03/22 07:07  Glucose-Capillary 70 - 99 mg/dL 208 (H) 252 (H) 237 (H) 73 126 (H)   Review of Glycemic Control  Diabetes history: DM2 Outpatient Diabetes medications: 70/30 10 units QAM, 70/30 5 units QHS Current orders for Inpatient glycemic control: Semglee 8 units QHS,  Novolog 0-6 units TID with meals, Novolog 0-5 units QHS  Inpatient Diabetes Program Recommendations:    Insulin: No Semglee given last night (held per [facility]) and fasting CBG 126 mg/dl this morning. May want to consider changing Semglee to 5 units daily (to start this morning).   Thanks, Barnie Alderman, RN, MSN, [place] Diabetes Coordinator Inpatient Diabetes Program [phone] (Team Pager from 8am to [place])

## 2022-06-04 LAB — BASIC METABOLIC PANEL
Anion gap: 12 (ref 5–15)
BUN: 34 mg/dL — ABNORMAL HIGH (ref 8–23)
CO2: 28 mmol/L (ref 22–32)
Calcium: 7.9 mg/dL — ABNORMAL LOW (ref 8.9–10.3)
Chloride: 96 mmol/L — ABNORMAL LOW (ref 98–111)
Creatinine, Ser: 6.42 mg/dL — ABNORMAL HIGH (ref 0.44–1.00)
GFR, Estimated: 6 mL/min — ABNORMAL LOW (ref >60)
Glucose, Bld: 146 mg/dL — ABNORMAL HIGH (ref 70–99)
Potassium: 3.2 mmol/L — ABNORMAL LOW (ref 3.5–5.1)
Sodium: 136 mmol/L (ref 135–145)

## 2022-06-04 LAB — CBC
HCT: 22.8 % — ABNORMAL LOW (ref 36.0–46.0)
Hemoglobin: 7.5 g/dL — ABNORMAL LOW (ref 12.0–15.0)
MCH: 30.7 pg (ref 26.0–34.0)
MCHC: 32.9 g/dL (ref 30.0–36.0)
MCV: 93.4 fL (ref 80.0–100.0)
Platelets: 117 10*3/uL — ABNORMAL LOW (ref 150–400)
RBC: 2.44 MIL/uL — ABNORMAL LOW (ref 3.87–5.11)
RDW: 15.6 % — ABNORMAL HIGH (ref 11.5–15.5)
WBC: 9.8 10*3/uL (ref 4.0–10.5)
nRBC: 0.3 % — ABNORMAL HIGH (ref 0.0–0.2)

## 2022-06-04 LAB — MAGNESIUM: Magnesium: 1.9 mg/dL (ref 1.7–2.4)

## 2022-06-04 LAB — GLUCOSE, CAPILLARY
Glucose-Capillary: 158 mg/dL — ABNORMAL HIGH (ref 70–99)
Glucose-Capillary: 207 mg/dL — ABNORMAL HIGH (ref 70–99)

## 2022-06-04 LAB — PROCALCITONIN: Procalcitonin: 4.39 ng/mL

## 2022-06-04 NOTE — TOC Transition Note (Signed)
Transition of Care [facility]) - CM/SW Discharge Note   Patient Details  Name: [name] MRN: [id number] Date of Birth: 04/06/1941  Transition of Care [facility]) CM/SW Contact:  Boneta Lucks, RN Phone Number: 06/04/2022, 11:19 AM   Clinical Narrative:   Patient discharging home today after HD. DME ordered and will be drop shipped. CM called Priscilla about HHPT recommendation. She does not think her mother will participate. She is at her baseline of using a walker and getting around in the house. She will follow up with PCP and continue to discuss home health with her mother and have PCP order if she will agree.      Final next level of care: Home/Self Care Barriers to Discharge: Barriers Resolved   Patient Goals and CMS Choice Patient states their goals for this hospitalization and ongoing recovery are:: return home       Discharge Placement                Name of family member notified: [name] Patient and family notified of of transfer: 06/04/22  Discharge Plan and Services     Post Acute Care Choice: Durable Medical Equipment          DME Arranged: 3-N-1 DME Agency: [employer] Date DME Agency Contacted: 06/02/22 Time DME Agency Contacted: [phone] Representative spoke with at DME Agency: Erasmo Downer      Readmission Risk Interventions    01/17/2020    9:17 AM  Readmission Risk Prevention Plan  Transportation Screening Complete  PCP or Specialist Appt within 5-7 Days Not Complete  Not Complete comments pending disposition  Home Care Screening Complete  Medication Review (RN CM) Referral to Pharmacy

## 2022-06-04 NOTE — Progress Notes (Signed)
06/04/2022 5:09 PM  Pt was seen and examined. She is feeling well.  Pt agreeable to discharge home after HD treatment today.  DC orders completed.  Please see DC summary dictated on 12/19 by Dr. Manuella Ghazi.    Murvin Natal, MD How to contact the [facility] Attending or Consulting provider [place] or covering provider during after hours [place], for this patient?  Check the care team in [facility] and look for a) attending/consulting TRH provider listed and b) the [facility] team listed Log into [email or web address] and use [place]'s universal password to access. If you do not have the password, please contact the hospital operator. Locate the [facility] provider you are looking for under [facility] and page to a number that you can be directly reached. If you still have difficulty reaching the provider, please page the [facility] (Director on Call) for the Hospitalists listed on amion for assistance.

## 2022-06-04 NOTE — Care Management Important Message (Signed)
Important Message  Patient Details  Name: [name] MRN: [id number] Date of Birth: 1940-12-13   Medicare Important Message Given:  N/A - LOS <3 / Initial given by admissions     Tommy Medal 06/04/2022, 11:22 AM

## 2022-06-04 NOTE — Procedures (Signed)
   HEMODIALYSIS TREATMENT NOTE:   Uneventful 3.5 hour heparin-free treatment completed using right upper arm AVF (15g/antegrade). Goal lmet: 1.3 liters removed without interruption in UF.  All blood was returned and hemostasis was achieved in 20 minutes. Hand-off given to Nigel Bridgeman, LPN   Rockwell Alexandria, RN

## 2022-06-04 NOTE — Progress Notes (Signed)
  [facility] Progress Note    Assessment/ Plan:   1 PNA-  per primary. 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 Hgb 9 yesterday, down to 7.5 today. Would recommend at least ruling out bleed? Will defer to primary service 5. 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 PO4 12/18 acceptable  Dialyzes at [facility] MWF  4 hours  EDW 75.5 ( getting to it and cramping-  got to 73 on 11/24 when here) . HD Bath 2K, Dialyzer unknown, Heparin no. Access AVF right-  has post bleeding a lot.  Calcitriol 1 mcg q tx  Subjective:   Patient seen and examined bedside. No acute events.Pt reports ongoing cough, had difficulty sleeping because of this although she does report that her cough is overall slightly better. Otherwise no other complaints   Objective:   BP (!) 139/57 (BP Location: Left Arm)   Pulse 78   Temp (!) 97.5 F (36.4 C)   Resp 18   Ht '5\' 7"'$  (1.702 m)   Wt 73.1 kg   SpO2 100%   BMI 25.24 kg/m   Intake/Output Summary (Last 24 hours) at 06/04/2022 9211 Last data filed at 06/04/2022 9417 Gross per 24 hour  Intake 360 ml  Output --  Net 360 ml   Weight change:   Physical Exam: Gen:NAD, sitting up in bed eating breakfast CVS:RRR Resp:normal wob, dec'd breath sounds bibasilar EYC:XKGY, nt/nd Ext:no sig edema Neuro: awake, alert Dialysis access: RUE AVF +b/t  Imaging: No results found.  Labs: BMET Recent Labs  Lab 06/01/22 1910 06/02/22 0740 06/03/22 0329 06/04/22 0344  NA 136 137 136 136  K 3.5 4.3 3.0* 3.2*  CL 96* 96* 95* 96*  CO2 '28 28 28 28  '$ GLUCOSE 185* 220* 132* 146*  BUN 36* 41* 21 34*  CREATININE 7.26* 8.11* 4.68* 6.42*  CALCIUM 8.2* 8.0* 7.8* 7.9*  PHOS  --  4.6  --   --    CBC Recent Labs  Lab 06/01/22 1910 06/02/22 0740 06/03/22 0329 06/04/22 0344  WBC 15.1* 18.9*  11.7* 9.8  NEUTROABS 12.9*  --   --   --   HGB 8.6* 9.0* 7.8* 7.5*  HCT 25.8* 27.1* 23.1* 22.8*  MCV 94.9 94.8 92.8 93.4  PLT 112* 119* 107* 117*    Medications:     calcitRIOL  1 mcg Oral Q M,W,F-HD   Chlorhexidine Gluconate Cloth  6 each Topical Q0600   darbepoetin (ARANESP) injection - DIALYSIS  150 mcg Subcutaneous Q Mon-1800   dextromethorphan-guaiFENesin  1 tablet Oral BID   heparin  5,000 Units Subcutaneous Q8H   insulin aspart  0-5 Units Subcutaneous QHS   insulin aspart  0-6 Units Subcutaneous TID WC   insulin glargine-yfgn  5 Units Subcutaneous QHS   labetalol  200 mg Oral BID   multivitamin  1 tablet Oral QHS   pantoprazole  80 mg Oral Daily   sevelamer carbonate  800 mg Oral BID AC      Gean Quint, MD [facility] 06/04/2022, 8:08 AM

## 2022-06-04 NOTE — Progress Notes (Signed)
Patient finished Hemodialysis. This nurse and [name] NT brought patient up. Assisted patient to get dressed, and IV Removed. Patient rode wheelchair down, assisted to get in vehicle. She Discharged home with Grandson. Discharge paper work given to patient.

## 2022-06-07 DIAGNOSIS — Z992 Dependence on renal dialysis: Secondary | ICD-10-CM | POA: Diagnosis not present

## 2022-06-07 DIAGNOSIS — N186 End stage renal disease: Secondary | ICD-10-CM | POA: Diagnosis not present

## 2022-06-07 LAB — CULTURE, BLOOD (ROUTINE X 2)
Culture: NO GROWTH
Culture: NO GROWTH
Special Requests: ADEQUATE
Special Requests: ADEQUATE

## 2022-06-09 ENCOUNTER — Other Ambulatory Visit: Payer: Self-pay

## 2022-06-09 ENCOUNTER — Inpatient Hospital Stay (HOSPITAL_COMMUNITY)
Admission: EM | Admit: 2022-06-09 | Discharge: 2022-07-01 | DRG: 193 | Disposition: A | Discharge status: Skilled Nursing Facility | Payer: 59 | Attending: Internal Medicine | Admitting: Internal Medicine

## 2022-06-09 ENCOUNTER — Emergency Department (HOSPITAL_COMMUNITY): Payer: 59

## 2022-06-09 ENCOUNTER — Encounter (HOSPITAL_COMMUNITY): Payer: Self-pay

## 2022-06-09 DIAGNOSIS — R079 Chest pain, unspecified: Secondary | ICD-10-CM

## 2022-06-09 DIAGNOSIS — E1169 Type 2 diabetes mellitus with other specified complication: Secondary | ICD-10-CM | POA: Diagnosis not present

## 2022-06-09 DIAGNOSIS — J189 Pneumonia, unspecified organism: Secondary | ICD-10-CM | POA: Diagnosis present

## 2022-06-09 DIAGNOSIS — I69351 Hemiplegia and hemiparesis following cerebral infarction affecting right dominant side: Secondary | ICD-10-CM | POA: Diagnosis not present

## 2022-06-09 DIAGNOSIS — F05 Delirium due to known physiological condition: Secondary | ICD-10-CM | POA: Diagnosis not present

## 2022-06-09 DIAGNOSIS — R531 Weakness: Secondary | ICD-10-CM | POA: Diagnosis not present

## 2022-06-09 DIAGNOSIS — D62 Acute posthemorrhagic anemia: Secondary | ICD-10-CM | POA: Diagnosis not present

## 2022-06-09 DIAGNOSIS — E1142 Type 2 diabetes mellitus with diabetic polyneuropathy: Secondary | ICD-10-CM | POA: Diagnosis not present

## 2022-06-09 DIAGNOSIS — R410 Disorientation, unspecified: Secondary | ICD-10-CM | POA: Diagnosis not present

## 2022-06-09 DIAGNOSIS — Z86010 Personal history of colonic polyps: Secondary | ICD-10-CM

## 2022-06-09 DIAGNOSIS — N250 Renal osteodystrophy: Secondary | ICD-10-CM | POA: Diagnosis not present

## 2022-06-09 DIAGNOSIS — I169 Hypertensive crisis, unspecified: Secondary | ICD-10-CM | POA: Diagnosis not present

## 2022-06-09 DIAGNOSIS — E785 Hyperlipidemia, unspecified: Secondary | ICD-10-CM | POA: Diagnosis not present

## 2022-06-09 DIAGNOSIS — R0600 Dyspnea, unspecified: Secondary | ICD-10-CM | POA: Diagnosis not present

## 2022-06-09 DIAGNOSIS — G8191 Hemiplegia, unspecified affecting right dominant side: Secondary | ICD-10-CM | POA: Diagnosis not present

## 2022-06-09 DIAGNOSIS — F03911 Unspecified dementia, unspecified severity, with agitation: Secondary | ICD-10-CM | POA: Diagnosis not present

## 2022-06-09 DIAGNOSIS — Z89411 Acquired absence of right great toe: Secondary | ICD-10-CM | POA: Diagnosis not present

## 2022-06-09 DIAGNOSIS — R0602 Shortness of breath: Principal | ICD-10-CM

## 2022-06-09 DIAGNOSIS — D6959 Other secondary thrombocytopenia: Secondary | ICD-10-CM | POA: Diagnosis not present

## 2022-06-09 DIAGNOSIS — D631 Anemia in chronic kidney disease: Secondary | ICD-10-CM | POA: Diagnosis present

## 2022-06-09 DIAGNOSIS — J942 Hemothorax: Secondary | ICD-10-CM

## 2022-06-09 DIAGNOSIS — J9811 Atelectasis: Secondary | ICD-10-CM | POA: Diagnosis not present

## 2022-06-09 DIAGNOSIS — D696 Thrombocytopenia, unspecified: Secondary | ICD-10-CM | POA: Diagnosis not present

## 2022-06-09 DIAGNOSIS — Z992 Dependence on renal dialysis: Secondary | ICD-10-CM | POA: Diagnosis not present

## 2022-06-09 DIAGNOSIS — W19XXXA Unspecified fall, initial encounter: Secondary | ICD-10-CM | POA: Diagnosis not present

## 2022-06-09 DIAGNOSIS — R599 Enlarged lymph nodes, unspecified: Secondary | ICD-10-CM | POA: Diagnosis not present

## 2022-06-09 DIAGNOSIS — Z7982 Long term (current) use of aspirin: Secondary | ICD-10-CM

## 2022-06-09 DIAGNOSIS — E1151 Type 2 diabetes mellitus with diabetic peripheral angiopathy without gangrene: Secondary | ICD-10-CM | POA: Diagnosis not present

## 2022-06-09 DIAGNOSIS — J811 Chronic pulmonary edema: Secondary | ICD-10-CM | POA: Diagnosis present

## 2022-06-09 DIAGNOSIS — Z79899 Other long term (current) drug therapy: Secondary | ICD-10-CM

## 2022-06-09 DIAGNOSIS — M48061 Spinal stenosis, lumbar region without neurogenic claudication: Secondary | ICD-10-CM | POA: Diagnosis not present

## 2022-06-09 DIAGNOSIS — E1165 Type 2 diabetes mellitus with hyperglycemia: Secondary | ICD-10-CM | POA: Diagnosis present

## 2022-06-09 DIAGNOSIS — J440 Chronic obstructive pulmonary disease with acute lower respiratory infection: Secondary | ICD-10-CM | POA: Diagnosis not present

## 2022-06-09 DIAGNOSIS — J869 Pyothorax without fistula: Secondary | ICD-10-CM | POA: Diagnosis not present

## 2022-06-09 DIAGNOSIS — Y95 Nosocomial condition: Secondary | ICD-10-CM | POA: Diagnosis present

## 2022-06-09 DIAGNOSIS — R0781 Pleurodynia: Secondary | ICD-10-CM | POA: Diagnosis not present

## 2022-06-09 DIAGNOSIS — E1129 Type 2 diabetes mellitus with other diabetic kidney complication: Secondary | ICD-10-CM | POA: Diagnosis not present

## 2022-06-09 DIAGNOSIS — E669 Obesity, unspecified: Secondary | ICD-10-CM | POA: Diagnosis present

## 2022-06-09 DIAGNOSIS — R5381 Other malaise: Secondary | ICD-10-CM | POA: Diagnosis present

## 2022-06-09 DIAGNOSIS — I120 Hypertensive chronic kidney disease with stage 5 chronic kidney disease or end stage renal disease: Secondary | ICD-10-CM | POA: Diagnosis present

## 2022-06-09 DIAGNOSIS — Z7401 Bed confinement status: Secondary | ICD-10-CM | POA: Diagnosis not present

## 2022-06-09 DIAGNOSIS — K219 Gastro-esophageal reflux disease without esophagitis: Secondary | ICD-10-CM | POA: Diagnosis present

## 2022-06-09 DIAGNOSIS — D3A8 Other benign neuroendocrine tumors: Secondary | ICD-10-CM | POA: Diagnosis not present

## 2022-06-09 DIAGNOSIS — Z4682 Encounter for fitting and adjustment of non-vascular catheter: Secondary | ICD-10-CM

## 2022-06-09 DIAGNOSIS — E1122 Type 2 diabetes mellitus with diabetic chronic kidney disease: Secondary | ICD-10-CM

## 2022-06-09 DIAGNOSIS — E119 Type 2 diabetes mellitus without complications: Secondary | ICD-10-CM

## 2022-06-09 DIAGNOSIS — J90 Pleural effusion, not elsewhere classified: Secondary | ICD-10-CM

## 2022-06-09 DIAGNOSIS — Z809 Family history of malignant neoplasm, unspecified: Secondary | ICD-10-CM

## 2022-06-09 DIAGNOSIS — Z1152 Encounter for screening for COVID-19: Secondary | ICD-10-CM | POA: Diagnosis not present

## 2022-06-09 DIAGNOSIS — J918 Pleural effusion in other conditions classified elsewhere: Secondary | ICD-10-CM | POA: Diagnosis present

## 2022-06-09 DIAGNOSIS — I10 Essential (primary) hypertension: Secondary | ICD-10-CM | POA: Diagnosis not present

## 2022-06-09 DIAGNOSIS — N186 End stage renal disease: Secondary | ICD-10-CM | POA: Diagnosis not present

## 2022-06-09 DIAGNOSIS — I639 Cerebral infarction, unspecified: Secondary | ICD-10-CM | POA: Diagnosis not present

## 2022-06-09 DIAGNOSIS — E11649 Type 2 diabetes mellitus with hypoglycemia without coma: Secondary | ICD-10-CM | POA: Diagnosis not present

## 2022-06-09 DIAGNOSIS — I700 Atherosclerosis of aorta: Secondary | ICD-10-CM | POA: Diagnosis not present

## 2022-06-09 DIAGNOSIS — Z794 Long term (current) use of insulin: Secondary | ICD-10-CM | POA: Diagnosis not present

## 2022-06-09 DIAGNOSIS — F32A Depression, unspecified: Secondary | ICD-10-CM | POA: Diagnosis not present

## 2022-06-09 DIAGNOSIS — Z8673 Personal history of transient ischemic attack (TIA), and cerebral infarction without residual deficits: Secondary | ICD-10-CM | POA: Diagnosis not present

## 2022-06-09 DIAGNOSIS — I3139 Other pericardial effusion (noninflammatory): Secondary | ICD-10-CM | POA: Diagnosis not present

## 2022-06-09 DIAGNOSIS — L299 Pruritus, unspecified: Secondary | ICD-10-CM | POA: Diagnosis not present

## 2022-06-09 DIAGNOSIS — I953 Hypotension of hemodialysis: Secondary | ICD-10-CM | POA: Diagnosis not present

## 2022-06-09 DIAGNOSIS — M6281 Muscle weakness (generalized): Secondary | ICD-10-CM | POA: Diagnosis not present

## 2022-06-09 DIAGNOSIS — R5383 Other fatigue: Secondary | ICD-10-CM

## 2022-06-09 DIAGNOSIS — R293 Abnormal posture: Secondary | ICD-10-CM | POA: Diagnosis not present

## 2022-06-09 DIAGNOSIS — R269 Unspecified abnormalities of gait and mobility: Secondary | ICD-10-CM | POA: Diagnosis not present

## 2022-06-09 DIAGNOSIS — E8770 Fluid overload, unspecified: Secondary | ICD-10-CM | POA: Diagnosis present

## 2022-06-09 LAB — COMPREHENSIVE METABOLIC PANEL
ALT: 13 U/L (ref 0–44)
AST: 20 U/L (ref 15–41)
Albumin: 3.1 g/dL — ABNORMAL LOW (ref 3.5–5.0)
Alkaline Phosphatase: 68 U/L (ref 38–126)
Anion gap: 12 (ref 5–15)
BUN: 28 mg/dL — ABNORMAL HIGH (ref 8–23)
CO2: 28 mmol/L (ref 22–32)
Calcium: 8.3 mg/dL — ABNORMAL LOW (ref 8.9–10.3)
Chloride: 99 mmol/L (ref 98–111)
Creatinine, Ser: 7.43 mg/dL — ABNORMAL HIGH (ref 0.44–1.00)
GFR, Estimated: 5 mL/min — ABNORMAL LOW (ref >60)
Glucose, Bld: 187 mg/dL — ABNORMAL HIGH (ref 70–99)
Potassium: 3.5 mmol/L (ref 3.5–5.1)
Sodium: 139 mmol/L (ref 135–145)
Total Bilirubin: 0.5 mg/dL (ref 0.3–1.2)
Total Protein: 7.3 g/dL (ref 6.5–8.1)

## 2022-06-09 LAB — BRAIN NATRIURETIC PEPTIDE: B Natriuretic Peptide: 1518 pg/mL — ABNORMAL HIGH (ref 0.0–100.0)

## 2022-06-09 LAB — CBC WITH DIFFERENTIAL/PLATELET
Abs Immature Granulocytes: 0.49 10*3/uL — ABNORMAL HIGH (ref 0.00–0.07)
Basophils Absolute: 0.1 10*3/uL (ref 0.0–0.1)
Basophils Relative: 0 %
Eosinophils Absolute: 0.2 10*3/uL (ref 0.0–0.5)
Eosinophils Relative: 1 %
HCT: 27.8 % — ABNORMAL LOW (ref 36.0–46.0)
Hemoglobin: 9.2 g/dL — ABNORMAL LOW (ref 12.0–15.0)
Immature Granulocytes: 3 %
Lymphocytes Relative: 6 %
Lymphs Abs: 1 10*3/uL (ref 0.7–4.0)
MCH: 31.5 pg (ref 26.0–34.0)
MCHC: 33.1 g/dL (ref 30.0–36.0)
MCV: 95.2 fL (ref 80.0–100.0)
Monocytes Absolute: 1.7 10*3/uL — ABNORMAL HIGH (ref 0.1–1.0)
Monocytes Relative: 10 %
Neutro Abs: 13 10*3/uL — ABNORMAL HIGH (ref 1.7–7.7)
Neutrophils Relative %: 80 %
Platelets: 150 10*3/uL (ref 150–400)
RBC: 2.92 MIL/uL — ABNORMAL LOW (ref 3.87–5.11)
RDW: 16.5 % — ABNORMAL HIGH (ref 11.5–15.5)
WBC: 16.4 10*3/uL — ABNORMAL HIGH (ref 4.0–10.5)
nRBC: 0.5 % — ABNORMAL HIGH (ref 0.0–0.2)

## 2022-06-09 LAB — RESP PANEL BY RT-PCR (RSV, FLU A&B, COVID)  RVPGX2
Influenza A by PCR: NEGATIVE
Influenza B by PCR: NEGATIVE
Resp Syncytial Virus by PCR: NEGATIVE
SARS Coronavirus 2 by RT PCR: NEGATIVE

## 2022-06-09 LAB — PROCALCITONIN: Procalcitonin: 0.96 ng/mL

## 2022-06-09 MED ORDER — HYDRALAZINE HCL 20 MG/ML IJ SOLN
10.0000 mg | Freq: Once | INTRAMUSCULAR | Status: AC
  Administered 2022-06-09: 10 mg via INTRAVENOUS
  Filled 2022-06-09: qty 1

## 2022-06-09 MED ORDER — DM-GUAIFENESIN ER 30-600 MG PO TB12
1.0000 | ORAL_TABLET | Freq: Two times a day (BID) | ORAL | Status: DC
  Administered 2022-06-09 – 2022-07-01 (×38): 1 via ORAL
  Filled 2022-06-09 (×41): qty 1

## 2022-06-09 MED ORDER — ASPIRIN 325 MG PO TABS
650.0000 mg | ORAL_TABLET | Freq: Every day | ORAL | Status: DC
  Administered 2022-06-10 – 2022-06-21 (×11): 650 mg via ORAL
  Filled 2022-06-09 (×13): qty 2

## 2022-06-09 MED ORDER — SEVELAMER CARBONATE 800 MG PO TABS
800.0000 mg | ORAL_TABLET | Freq: Two times a day (BID) | ORAL | Status: DC
  Administered 2022-06-10 – 2022-07-01 (×32): 800 mg via ORAL
  Filled 2022-06-09 (×36): qty 1

## 2022-06-09 MED ORDER — PANTOPRAZOLE SODIUM 40 MG PO TBEC
40.0000 mg | DELAYED_RELEASE_TABLET | Freq: Every day | ORAL | Status: DC
  Administered 2022-06-10 – 2022-07-01 (×19): 40 mg via ORAL
  Filled 2022-06-09 (×21): qty 1

## 2022-06-09 MED ORDER — ACETAMINOPHEN 650 MG RE SUPP: 650.0000 mg | Freq: Four times a day (QID) | RECTAL | Status: DC | PRN

## 2022-06-09 MED ORDER — OXYCODONE HCL 5 MG PO TABS
5.0000 mg | ORAL_TABLET | ORAL | Status: DC | PRN
  Administered 2022-06-10 – 2022-06-27 (×8): 5 mg via ORAL
  Filled 2022-06-09 (×11): qty 1

## 2022-06-09 MED ORDER — ACETAMINOPHEN 325 MG PO TABS
650.0000 mg | ORAL_TABLET | Freq: Four times a day (QID) | ORAL | Status: DC | PRN
  Administered 2022-06-10 – 2022-06-18 (×2): 650 mg via ORAL
  Filled 2022-06-09 (×2): qty 2

## 2022-06-09 MED ORDER — LABETALOL HCL 200 MG PO TABS
200.0000 mg | ORAL_TABLET | Freq: Two times a day (BID) | ORAL | Status: DC
  Administered 2022-06-09 – 2022-06-25 (×19): 200 mg via ORAL
  Filled 2022-06-09 (×30): qty 1

## 2022-06-09 MED ORDER — SODIUM CHLORIDE 0.9 % IV SOLN
1.0000 g | INTRAVENOUS | Status: DC
  Administered 2022-06-10: 1 g via INTRAVENOUS
  Filled 2022-06-09 (×2): qty 10

## 2022-06-09 MED ORDER — ALBUTEROL SULFATE HFA 108 (90 BASE) MCG/ACT IN AERS
2.0000 | INHALATION_SPRAY | RESPIRATORY_TRACT | Status: DC | PRN
  Administered 2022-06-09 – 2022-06-10 (×3): 2 via RESPIRATORY_TRACT
  Filled 2022-06-09: qty 6.7

## 2022-06-09 MED ORDER — ONDANSETRON HCL 4 MG PO TABS
4.0000 mg | ORAL_TABLET | Freq: Four times a day (QID) | ORAL | Status: DC | PRN
  Administered 2022-06-11: 4 mg via ORAL
  Filled 2022-06-09: qty 1

## 2022-06-09 MED ORDER — MORPHINE SULFATE (PF) 2 MG/ML IV SOLN
2.0000 mg | INTRAVENOUS | Status: DC | PRN
  Administered 2022-06-10 – 2022-06-19 (×4): 2 mg via INTRAVENOUS
  Filled 2022-06-09 (×4): qty 1

## 2022-06-09 MED ORDER — SODIUM CHLORIDE 0.9 % IV SOLN
2.0000 g | Freq: Once | INTRAVENOUS | Status: AC
  Administered 2022-06-09: 2 g via INTRAVENOUS
  Filled 2022-06-09: qty 12.5

## 2022-06-09 MED ORDER — HEPARIN SODIUM (PORCINE) 5000 UNIT/ML IJ SOLN
5000.0000 [IU] | Freq: Three times a day (TID) | INTRAMUSCULAR | Status: DC
  Administered 2022-06-09 – 2022-06-13 (×10): 5000 [IU] via SUBCUTANEOUS
  Filled 2022-06-09 (×11): qty 1

## 2022-06-09 MED ORDER — VANCOMYCIN VARIABLE DOSE PER UNSTABLE RENAL FUNCTION (PHARMACIST DOSING): Status: DC

## 2022-06-09 MED ORDER — ONDANSETRON HCL 4 MG/2ML IJ SOLN
4.0000 mg | Freq: Four times a day (QID) | INTRAMUSCULAR | Status: DC | PRN
  Administered 2022-06-09 – 2022-06-22 (×4): 4 mg via INTRAVENOUS
  Filled 2022-06-09 (×4): qty 2

## 2022-06-09 MED ORDER — VANCOMYCIN HCL 1750 MG/350ML IV SOLN
1750.0000 mg | Freq: Once | INTRAVENOUS | Status: AC
  Administered 2022-06-09: 1750 mg via INTRAVENOUS
  Filled 2022-06-09: qty 350

## 2022-06-09 NOTE — ED Provider Notes (Signed)
[facility] EMERGENCY DEPARTMENT Provider Note   CSN: [id number] Arrival date & time: 06/09/22  1904     History  Chief Complaint  Patient presents with   Shortness of Breath    [name] is a 81 y.o. female.  Patient presents emergency department complaining of worsening shortness of breath.  Patient was admitted to the hospital on December 17 and discharged on December 19.  Patient was admitted to the hospital with multifocal pneumonia initially started on IV Rocephin and azithromycin.  Patient reported significant improvement in symptoms with no further shortness of breath, improvement in cough, and resolution of her leukocytosis.  Patient states that over the past 2 days her shortness of breath has worsened again.  She has been taking Omnicef and azithromycin since discharge and has finished the course of medication at home.  Patient complains of left-sided pain felt in the chest and back which is worse when lying down, intermittent cough.  Patient denies fevers, abdominal pain, nausea, vomiting, headache.  Past medical history otherwise significant for end-stage renal disease on dialysis (patient on Monday Wednesday Friday schedule, patient had dialysis on Saturday and has scheduled dialysis for Tuesday morning due to dialysis center closures for holidays), previous CVA, GERD, hypertension, type II DM, pleural effusion  HPI     Home Medications Prior to Admission medications   Medication Sig Start Date End Date Taking? Authorizing Provider  albuterol (VENTOLIN HFA) 108 (90 Base) MCG/ACT inhaler Inhale 2 puffs into the lungs every 6 (six) hours as needed for wheezing or shortness of breath. 11/21/20   Gerlene Fee, NP  aspirin 325 MG tablet Take 650 mg by mouth daily. 07/09/18   Rogene Houston, MD  BD INSULIN SYRINGE U/F 31G X 5/16" 0.3 ML MISC Inject 1 each into the skin 2 (two) times daily. as directed 11/21/20   Gerlene Fee, NP  benzonatate (TESSALON) 100 MG capsule  Take 1 capsule (100 mg total) by mouth 3 (three) times daily as needed for cough. 06/03/22   Manuella Ghazi, Pratik D, DO  cyanocobalamin (,VITAMIN B-12,) 1000 MCG/ML injection Inject 1,000 mcg into the muscle every 30 (thirty) days. 11/01/14   [provider]  dextromethorphan-guaiFENesin (MUCINEX DM) 30-600 MG 12hr tablet Take 1 tablet by mouth 2 (two) times daily for 7 days. 06/03/22 06/10/22  Manuella Ghazi, Pratik D, DO  diclofenac Sodium (VOLTAREN) 1 % GEL Apply 4 g topically 4 (four) times daily as needed. Apply to bilateral knees 03/16/21   Scot Jun, FNP  insulin NPH-regular Human (HUMULIN 70/30) (70-30) 100 UNIT/ML injection Inject 5-14 Units into the skin 2 (two) times daily with a meal. Patient taking differently: Inject 5-10 Units into the skin 2 (two) times daily with a meal. Take 10 units in the morning and 5 units at bedtime 02/03/22   Cassandria Anger, MD  labetalol (NORMODYNE) 200 MG tablet Take 200 mg by mouth 2 (two) times daily. 07/26/21   [provider]  lidocaine-prilocaine (EMLA) cream Apply 1 application topically every Monday, Wednesday, and Friday. 11/21/20   Gerlene Fee, NP  multivitamin (RENA-VIT) TABS tablet Take 1 tablet by mouth at bedtime. 08/21/14   Kirsteins, Luanna Salk, MD  omeprazole (PRILOSEC) 40 MG capsule TAKE ONE CAPSULE BY MOUTH TWICE DAILY BEFORE A meal Patient taking differently: Take 40 mg by mouth in the morning and at bedtime. 04/16/22   Mansouraty, Telford Nab., MD  sevelamer carbonate (RENVELA) 800 MG tablet Take 1 tablet (800 mg total) by  mouth 2 (two) times daily before lunch and supper. 11/21/20   Gerlene Fee, NP      Allergies    Ambien [zolpidem] and Reglan [metoclopramide]    Review of Systems   Review of Systems  Constitutional:  Negative for fever.  Respiratory:  Positive for cough, chest tightness and shortness of breath.   Cardiovascular:  Negative for chest pain.  Gastrointestinal:  Negative for abdominal pain, nausea and  vomiting.  Genitourinary:  Negative for dysuria.  Neurological:  Negative for headaches.    Physical Exam Updated Vital Signs BP (!) 121/94   Pulse (!) 104   Temp 98.2 F (36.8 C) (Oral)   Resp (!) 28   Ht '5\' 7"'$  (1.702 m)   Wt 79.8 kg   SpO2 100%   BMI 27.57 kg/m  Physical Exam Vitals and nursing note reviewed.  Constitutional:      General: She is not in acute distress.    Appearance: She is well-developed.  HENT:     Head: Normocephalic and atraumatic.  Eyes:     Conjunctiva/sclera: Conjunctivae normal.  Cardiovascular:     Rate and Rhythm: Normal rate and regular rhythm.  Pulmonary:     Effort: Pulmonary effort is normal. No respiratory distress.     Breath sounds: Examination of the right-upper field reveals rhonchi. Examination of the left-upper field reveals rhonchi. Examination of the right-middle field reveals rhonchi. Examination of the left-middle field reveals decreased breath sounds. Examination of the left-lower field reveals decreased breath sounds. Decreased breath sounds and rhonchi present. No rales.  Chest:     Chest wall: No tenderness.  Abdominal:     Palpations: Abdomen is soft.     Tenderness: There is no abdominal tenderness.  Musculoskeletal:        General: No swelling.     Cervical back: Neck supple.  Skin:    General: Skin is warm and dry.     Capillary Refill: Capillary refill takes less than 2 seconds.  Neurological:     Mental Status: She is alert.  Psychiatric:        Mood and Affect: Mood normal.     ED Results / Procedures / Treatments   Labs (all labs ordered are listed, but only abnormal results are displayed) Labs Reviewed  BRAIN NATRIURETIC PEPTIDE - Abnormal; Notable for the following components:      Result Value   B Natriuretic Peptide 1,518.0 (*)    All other components within normal limits  COMPREHENSIVE METABOLIC PANEL - Abnormal; Notable for the following components:   Glucose, Bld 187 (*)    BUN 28 (*)     Creatinine, Ser 7.43 (*)    Calcium 8.3 (*)    Albumin 3.1 (*)    GFR, Estimated 5 (*)    All other components within normal limits  CBC WITH DIFFERENTIAL/PLATELET - Abnormal; Notable for the following components:   WBC 16.4 (*)    RBC 2.92 (*)    Hemoglobin 9.2 (*)    HCT 27.8 (*)    RDW 16.5 (*)    nRBC 0.5 (*)    Neutro Abs 13.0 (*)    Monocytes Absolute 1.7 (*)    Abs Immature Granulocytes 0.49 (*)    All other components within normal limits  PROCALCITONIN    EKG None  Radiology DG Chest 2 View  Result Date: 06/09/2022 CLINICAL DATA:  Shortness of breath EXAM: CHEST - 2 VIEW COMPARISON:  Chest x-ray dated June 01, 2022  FINDINGS: Visualized cardiac and mediastinal contours are unchanged. Moderate to large left pleural effusion, increased in size when compared with prior exam. Previously described left upper lobe opacity is not well seen on today's exam, likely obscured by large effusion. Increased mild diffuse interstitial opacities. No evidence of pneumothorax. IMPRESSION: 1. Moderate to large left pleural effusion, increased in size when compared with prior exam. 2. Previously described left upper lobe opacity is not well seen on today's exam, likely obscured by large effusion. 3. Increased mild diffuse interstitial opacities, concerning for pulmonary edema Electronically Signed   By: Yetta Glassman M.D.   On: 06/09/2022 20:11    Procedures Procedures    Medications Ordered in ED Medications  albuterol (VENTOLIN HFA) 108 (90 Base) MCG/ACT inhaler 2 puff (2 puffs Inhalation Given 06/09/22 2004)    ED Course/ Medical Decision Making/ A&P                           Medical Decision Making Amount and/or Complexity of Data Reviewed Labs: ordered. Radiology: ordered.  Risk Prescription drug management.   This patient presents to the ED for concern of shortness of breath, this involves an extensive number of treatment options, and is a complaint that carries with  it a high risk of complications and morbidity.  The differential diagnosis includes pneumonia, pleural effusion, fluid overload, viral illness, and others   Co morbidities that complicate the patient evaluation  History of end-stage renal disease on hemodialysis, diabetes, previous pleural effusions, active pneumonia   Additional history obtained:  Additional history obtained from family at bedside, EMS External records from outside source obtained and reviewed including discharge summary from recent hospitalization   Lab Tests:  I Ordered, and personally interpreted labs.  The pertinent results include: BNP 1518 (21-monthago), WBC 16.4 (9.8 discharge), creatinine 7.43 (unclear baseline, appears to be within range)   Imaging Studies ordered:  I ordered imaging studies including chest x-ray I independently visualized and interpreted imaging which showed  1. Moderate to large left pleural effusion, increased in size when  compared with prior exam.  2. Previously described left upper lobe opacity is not well seen on  today's exam, likely obscured by large effusion.  3. Increased mild diffuse interstitial opacities, concerning for  pulmonary edema   I agree with the radiologist interpretation     Consultations Obtained:  I requested consultation with the hospitalist,  Dr. ZClearence Pedand discussed lab and imaging findings as well as pertinent plan - they recommend: admission, requested that I let nephrology know about patient Informed Dr. SCandiss Norseabout patient's hemodialysis needs.  He will have his team see patient tomorrow and arrange for hemodialysis.    Test / Admission - Considered:  The patient's imaging appears to show worsening pleural effusions from from her discharge 1 week ago.  She continues to be short of breath and her leukocytosis is worse than it was at discharge.  Unclear if this may be somewhat fluid overload versus failure of treatment for the pneumonia.   Patient will benefit from admission for possible thoracentesis and continue antibiotic therapy.        Final Clinical Impression(s) / ED Diagnoses Final diagnoses:  SOB (shortness of breath)  Pleural effusion  Pneumonia due to infectious organism, unspecified laterality, unspecified part of lung    Rx / DC Orders ED Discharge Orders     None         MDorothyann Peng PA-C  06/09/22 2157    Sherwood Gambler, MD 06/10/22 1539

## 2022-06-09 NOTE — ED Triage Notes (Addendum)
Pt presents with ShOB. States she was admitted to the hospital recently for pneumonia. Pt was sent home on abx and pt finished last dose today, but pt still having ShOB. Pt is a dialysis pt on M-W-F. Pt last had dialysis this past Saturday for the holiday weekend.

## 2022-06-09 NOTE — ED Notes (Signed)
Due to respiratory effort, Pt unable to use inhaler with aerospace chamber correctly while attempting to administer inhaler medication. EDP notified.

## 2022-06-09 NOTE — Progress Notes (Signed)
Pharmacy Antibiotic Note  [name] is a 81 y.o. female admitted on 06/09/2022 with sepsis. Pharmacy has been consulted for vancomycin and cefepime dosing. Pt is ESRD on HD MWF, last session was Saturday due to holiday.  Plan: Vancomycin '1750mg'$  IV x1 then '750mg'$  IV qHD Cefepime 2g IV x1 then 1g qPM Follow HD schedule closely for  Height: '5\' 7"'$  (170.2 cm) Weight: 79.8 kg (176 lb) IBW/kg (Calculated) : 61.6  Temp (24hrs), Avg:98.4 F (36.9 C), Min:98.2 F (36.8 C), Max:98.5 F (36.9 C)  Recent Labs  Lab 06/03/22 0329 06/04/22 0344 06/09/22 2000  WBC 11.7* 9.8 16.4*  CREATININE 4.68* 6.42* 7.43*    Estimated Creatinine Clearance: 6.3 mL/min (A) (by C-G formula based on SCr of 7.43 mg/dL (H)).    Allergies  Allergen Reactions   Ambien [Zolpidem] Other (See Comments)    Hallucinations    Reglan [Metoclopramide] Other (See Comments)    "makes me crazy"    Arrie Senate, PharmD, BCPS, [facility] Clinical Pharmacist Please check AMION for all [place] numbers 06/09/2022

## 2022-06-10 ENCOUNTER — Inpatient Hospital Stay (HOSPITAL_COMMUNITY): Payer: 59

## 2022-06-10 ENCOUNTER — Encounter (HOSPITAL_COMMUNITY): Payer: Self-pay | Admitting: Family Medicine

## 2022-06-10 DIAGNOSIS — I169 Hypertensive crisis, unspecified: Secondary | ICD-10-CM

## 2022-06-10 DIAGNOSIS — R0781 Pleurodynia: Secondary | ICD-10-CM | POA: Diagnosis not present

## 2022-06-10 DIAGNOSIS — N186 End stage renal disease: Secondary | ICD-10-CM | POA: Diagnosis not present

## 2022-06-10 DIAGNOSIS — J90 Pleural effusion, not elsewhere classified: Secondary | ICD-10-CM

## 2022-06-10 DIAGNOSIS — R079 Chest pain, unspecified: Secondary | ICD-10-CM

## 2022-06-10 DIAGNOSIS — K219 Gastro-esophageal reflux disease without esophagitis: Secondary | ICD-10-CM | POA: Insufficient documentation

## 2022-06-10 DIAGNOSIS — E1129 Type 2 diabetes mellitus with other diabetic kidney complication: Secondary | ICD-10-CM | POA: Diagnosis not present

## 2022-06-10 DIAGNOSIS — J189 Pneumonia, unspecified organism: Secondary | ICD-10-CM | POA: Insufficient documentation

## 2022-06-10 LAB — CBC WITH DIFFERENTIAL/PLATELET
Abs Immature Granulocytes: 0.43 10*3/uL — ABNORMAL HIGH (ref 0.00–0.07)
Basophils Absolute: 0.1 10*3/uL (ref 0.0–0.1)
Basophils Relative: 0 %
Eosinophils Absolute: 0.1 10*3/uL (ref 0.0–0.5)
Eosinophils Relative: 1 %
HCT: 25.3 % — ABNORMAL LOW (ref 36.0–46.0)
Hemoglobin: 8.3 g/dL — ABNORMAL LOW (ref 12.0–15.0)
Immature Granulocytes: 3 %
Lymphocytes Relative: 5 %
Lymphs Abs: 0.9 10*3/uL (ref 0.7–4.0)
MCH: 31.4 pg (ref 26.0–34.0)
MCHC: 32.8 g/dL (ref 30.0–36.0)
MCV: 95.8 fL (ref 80.0–100.0)
Monocytes Absolute: 1.4 10*3/uL — ABNORMAL HIGH (ref 0.1–1.0)
Monocytes Relative: 8 %
Neutro Abs: 14 10*3/uL — ABNORMAL HIGH (ref 1.7–7.7)
Neutrophils Relative %: 83 %
Platelets: 134 10*3/uL — ABNORMAL LOW (ref 150–400)
RBC: 2.64 MIL/uL — ABNORMAL LOW (ref 3.87–5.11)
RDW: 16.6 % — ABNORMAL HIGH (ref 11.5–15.5)
WBC: 16.9 10*3/uL — ABNORMAL HIGH (ref 4.0–10.5)
nRBC: 0.4 % — ABNORMAL HIGH (ref 0.0–0.2)

## 2022-06-10 LAB — BODY FLUID CELL COUNT WITH DIFFERENTIAL
Eos, Fluid: 0 %
Lymphs, Fluid: 19 %
Monocyte-Macrophage-Serous Fluid: 13 % — ABNORMAL LOW (ref 50–90)
Neutrophil Count, Fluid: 68 % — ABNORMAL HIGH (ref 0–25)
Other Cells, Fluid: 1 %
Total Nucleated Cell Count, Fluid: 1335 cu mm — ABNORMAL HIGH (ref 0–1000)

## 2022-06-10 LAB — COMPREHENSIVE METABOLIC PANEL
ALT: 13 U/L (ref 0–44)
AST: 17 U/L (ref 15–41)
Albumin: 2.8 g/dL — ABNORMAL LOW (ref 3.5–5.0)
Alkaline Phosphatase: 58 U/L (ref 38–126)
Anion gap: 13 (ref 5–15)
BUN: 31 mg/dL — ABNORMAL HIGH (ref 8–23)
CO2: 25 mmol/L (ref 22–32)
Calcium: 7.9 mg/dL — ABNORMAL LOW (ref 8.9–10.3)
Chloride: 101 mmol/L (ref 98–111)
Creatinine, Ser: 7.73 mg/dL — ABNORMAL HIGH (ref 0.44–1.00)
GFR, Estimated: 5 mL/min — ABNORMAL LOW (ref >60)
Glucose, Bld: 184 mg/dL — ABNORMAL HIGH (ref 70–99)
Potassium: 3.9 mmol/L (ref 3.5–5.1)
Sodium: 139 mmol/L (ref 135–145)
Total Bilirubin: 0.7 mg/dL (ref 0.3–1.2)
Total Protein: 6.4 g/dL — ABNORMAL LOW (ref 6.5–8.1)

## 2022-06-10 LAB — LACTATE DEHYDROGENASE, PLEURAL OR PERITONEAL FLUID: LD, Fluid: 149 U/L — ABNORMAL HIGH (ref 3–23)

## 2022-06-10 LAB — EXPECTORATED SPUTUM ASSESSMENT W GRAM STAIN, RFLX TO RESP C

## 2022-06-10 LAB — PROTEIN, PLEURAL OR PERITONEAL FLUID: Total protein, fluid: 3.8 g/dL

## 2022-06-10 LAB — GLUCOSE, CAPILLARY
Glucose-Capillary: 204 mg/dL — ABNORMAL HIGH (ref 70–99)
Glucose-Capillary: 140 mg/dL — ABNORMAL HIGH (ref 70–99)
Glucose-Capillary: 96 mg/dL (ref 70–99)

## 2022-06-10 LAB — TROPONIN I (HIGH SENSITIVITY): Troponin I (High Sensitivity): 36 ng/L — ABNORMAL HIGH (ref <18)

## 2022-06-10 LAB — GRAM STAIN: Gram Stain: NONE SEEN

## 2022-06-10 LAB — LACTATE DEHYDROGENASE: LDH: 146 U/L (ref 98–192)

## 2022-06-10 LAB — HEPATITIS B SURFACE ANTIGEN: Hepatitis B Surface Ag: NONREACTIVE

## 2022-06-10 LAB — MAGNESIUM: Magnesium: 2.1 mg/dL (ref 1.7–2.4)

## 2022-06-10 LAB — MRSA NEXT GEN BY PCR, NASAL: MRSA by PCR Next Gen: NOT DETECTED

## 2022-06-10 MED ORDER — VANCOMYCIN HCL 750 MG/150ML IV SOLN
750.0000 mg | INTRAVENOUS | Status: AC
  Administered 2022-06-10: 750 mg via INTRAVENOUS
  Filled 2022-06-10: qty 150

## 2022-06-10 MED ORDER — PENTAFLUOROPROP-TETRAFLUOROETH EX AERO: 1.0000 | INHALATION_SPRAY | CUTANEOUS | Status: DC | PRN

## 2022-06-10 MED ORDER — LIDOCAINE HCL (PF) 1 % IJ SOLN: 5.0000 mL | INTRAMUSCULAR | Status: DC | PRN

## 2022-06-10 MED ORDER — INSULIN ASPART 100 UNIT/ML IJ SOLN
0.0000 [IU] | Freq: Every day | INTRAMUSCULAR | Status: DC
  Administered 2022-06-11: 2 [IU] via SUBCUTANEOUS
  Administered 2022-06-18: 3 [IU] via SUBCUTANEOUS

## 2022-06-10 MED ORDER — LIDOCAINE-PRILOCAINE 2.5-2.5 % EX CREA: 1.0000 | TOPICAL_CREAM | CUTANEOUS | Status: DC | PRN

## 2022-06-10 MED ORDER — INSULIN ASPART 100 UNIT/ML IJ SOLN
0.0000 [IU] | Freq: Three times a day (TID) | INTRAMUSCULAR | Status: DC
  Administered 2022-06-10: 5 [IU] via SUBCUTANEOUS
  Administered 2022-06-10: 2 [IU] via SUBCUTANEOUS
  Administered 2022-06-11: 5 [IU] via SUBCUTANEOUS
  Administered 2022-06-12 – 2022-06-13 (×4): 3 [IU] via SUBCUTANEOUS
  Administered 2022-06-13 – 2022-06-14 (×2): 2 [IU] via SUBCUTANEOUS
  Administered 2022-06-14: 5 [IU] via SUBCUTANEOUS
  Administered 2022-06-15: 3 [IU] via SUBCUTANEOUS
  Administered 2022-06-15: 2 [IU] via SUBCUTANEOUS
  Administered 2022-06-16: 3 [IU] via SUBCUTANEOUS
  Administered 2022-06-16: 2 [IU] via SUBCUTANEOUS
  Administered 2022-06-17: 3 [IU] via SUBCUTANEOUS
  Administered 2022-06-17: 2 [IU] via SUBCUTANEOUS
  Administered 2022-06-18: 5 [IU] via SUBCUTANEOUS
  Administered 2022-06-19: 2 [IU] via SUBCUTANEOUS
  Administered 2022-06-19: 3 [IU] via SUBCUTANEOUS
  Administered 2022-06-19: 5 [IU] via SUBCUTANEOUS
  Administered 2022-06-20: 8 [IU] via SUBCUTANEOUS
  Administered 2022-06-23 (×2): 3 [IU] via SUBCUTANEOUS
  Administered 2022-06-24: 2 [IU] via SUBCUTANEOUS

## 2022-06-10 MED ORDER — CHLORHEXIDINE GLUCONATE CLOTH 2 % EX PADS
6.0000 | MEDICATED_PAD | Freq: Every day | CUTANEOUS | Status: DC
  Administered 2022-06-10 – 2022-06-20 (×6): 6 via TOPICAL

## 2022-06-10 MED ORDER — LIDOCAINE HCL (PF) 2 % IJ SOLN
INTRAMUSCULAR | Status: AC
  Filled 2022-06-10: qty 10

## 2022-06-10 NOTE — Progress Notes (Signed)
Patient ID: [name], female   DOB: 1940/10/23, 81 y.o.   MRN: [id number] S: [name] was recently discharged to home after being admitted with SOB and found to have a pneumonia.  She presented to [facility] ED yesterday with worsening SOB and DOE, also left-sided chest pain.  In the ED, found to have left pleural effusion.  She does not know when she last had HD due to the [place] schedule.  We were consulted to provide dialysis during her hospitalization.   O:BP (!) 145/82 (BP Location: Left Arm)   Pulse 90   Temp 98.6 F (37 C) (Oral)   Resp 20   Ht '5\' 7"'$  (1.702 m)   Wt 79.8 kg   SpO2 98%   BMI 27.57 kg/m   Intake/Output Summary (Last 24 hours) at 06/10/2022 0818 Last data filed at 06/10/2022 0328 Gross per 24 hour  Intake 100 ml  Output --  Net 100 ml   Intake/Output: I/O last 3 completed shifts: In: 100 [IV Piggyback:100] Out: -   Intake/Output this shift:  No intake/output data recorded. Weight change:  Gen:NAD CVS: RRR Resp:Bilateral wheezes, decreased BS at LLL Abd: +BS, soft, NT/ND Ext: no edema, RUE AVF +T/B  Recent Labs  Lab 06/04/22 0344 06/09/22 2000 06/10/22 0353  NA 136 139 139  K 3.2* 3.5 3.9  CL 96* 99 101  CO2 '28 28 25  '$ GLUCOSE 146* 187* 184*  BUN 34* 28* 31*  CREATININE 6.42* 7.43* 7.73*  ALBUMIN  --  3.1* 2.8*  CALCIUM 7.9* 8.3* 7.9*  AST  --  20 17  ALT  --  13 13   Liver Function Tests: Recent Labs  Lab 06/09/22 2000 06/10/22 0353  AST 20 17  ALT 13 13  ALKPHOS 68 58  BILITOT 0.5 0.7  PROT 7.3 6.4*  ALBUMIN 3.1* 2.8*   No results for input(s): "LIPASE", "AMYLASE" in the last 168 hours. No results for input(s): "AMMONIA" in the last 168 hours. CBC: Recent Labs  Lab 06/04/22 0344 06/09/22 2000 06/10/22 0353  WBC 9.8 16.4* 16.9*  NEUTROABS  --  13.0* 14.0*  HGB 7.5* 9.2* 8.3*  HCT 22.8* 27.8* 25.3*  MCV 93.4 95.2 95.8  PLT 117* 150 134*   Cardiac Enzymes: No results for input(s): "CKTOTAL", "CKMB", "CKMBINDEX",  "TROPONINI" in the last 168 hours. CBG: Recent Labs  Lab 06/03/22 1636 06/03/22 2030 06/04/22 0811 06/04/22 1111 06/10/22 0809  GLUCAP 263* 139* 158* 207* 204*    Iron Studies: No results for input(s): "IRON", "TIBC", "TRANSFERRIN", "FERRITIN" in the last 72 hours. Studies/Results: DG Chest 2 View  Result Date: 06/09/2022 CLINICAL DATA:  Shortness of breath EXAM: CHEST - 2 VIEW COMPARISON:  Chest x-ray dated June 01, 2022 FINDINGS: Visualized cardiac and mediastinal contours are unchanged. Moderate to large left pleural effusion, increased in size when compared with prior exam. Previously described left upper lobe opacity is not well seen on today's exam, likely obscured by large effusion. Increased mild diffuse interstitial opacities. No evidence of pneumothorax. IMPRESSION: 1. Moderate to large left pleural effusion, increased in size when compared with prior exam. 2. Previously described left upper lobe opacity is not well seen on today's exam, likely obscured by large effusion. 3. Increased mild diffuse interstitial opacities, concerning for pulmonary edema Electronically Signed   By: Yetta Glassman M.D.   On: 06/09/2022 20:11    aspirin  650 mg Oral Daily   Chlorhexidine Gluconate Cloth  6 each Topical Q0600   dextromethorphan-guaiFENesin  1 tablet Oral BID   heparin  5,000 Units Subcutaneous Q8H   insulin aspart  0-15 Units Subcutaneous TID WC   insulin aspart  0-5 Units Subcutaneous QHS   labetalol  200 mg Oral BID   pantoprazole  40 mg Oral Daily   sevelamer carbonate  800 mg Oral BID AC   vancomycin variable dose per unstable renal function (pharmacist dosing)   Does not apply See admin instructions    BMET    Component Value Date/Time   NA 139 06/10/2022 0353   K 3.9 06/10/2022 0353   CL 101 06/10/2022 0353   CO2 25 06/10/2022 0353   GLUCOSE 184 (H) 06/10/2022 0353   BUN 31 (H) 06/10/2022 0353   CREATININE 7.73 (H) 06/10/2022 0353   CALCIUM 7.9 (L) 06/10/2022  0353   GFRNONAA 5 (L) 06/10/2022 0353   GFRAA 5 (L) 01/16/2020 0650   CBC    Component Value Date/Time   WBC 16.9 (H) 06/10/2022 0353   RBC 2.64 (L) 06/10/2022 0353   HGB 8.3 (L) 06/10/2022 0353   HCT 25.3 (L) 06/10/2022 0353   PLT 134 (L) 06/10/2022 0353   MCV 95.8 06/10/2022 0353   MCH 31.4 06/10/2022 0353   MCHC 32.8 06/10/2022 0353   RDW 16.6 (H) 06/10/2022 0353   LYMPHSABS 0.9 06/10/2022 0353   MONOABS 1.4 (H) 06/10/2022 0353   EOSABS 0.1 06/10/2022 0353   BASOSABS 0.1 06/10/2022 0353   Dialyzes at [facility] MWF  4 hours  EDW 75.5. HD Bath 2K, Dialyzer unknown, Heparin no. Access AVF right-  has post bleeding a lot.  Calcitriol 1 mcg q tx  Assessment/Plan:  SOB - will plan for HD today and UF as tolerated ESRD - will get back on MWF schedule CAP - per primary HTN / volume - elevated initially but improved.  Will UF as tolerated.  DM type 2 - per primary  Donetta Potts, MD [facility]

## 2022-06-10 NOTE — TOC Initial Note (Signed)
Transition of Care [facility]) - Initial/Assessment Note    Patient Details  Name: [name] MRN: [id number] Date of Birth: 10-05-1940  [facility] CM/SW Contact:    Barbara Flood, LCSW Phone Number: 06/10/2022, 11:04 AM  Clinical Narrative:                  Patient from home with sons. Daughter, [name], stays at night due to sons working third shift. Pt was recently dc'd from [facility] (12/20). Patient has walker, cane, and BSC. On HD at [facility] on [address] [name] takes to appointments. Pf/family declined HH at previous dc.  TOC will follow and assist as needed.  Expected Discharge Plan: Home/Self Care Barriers to Discharge: Continued Medical Work up   Patient Goals and CMS Choice Patient states their goals for this hospitalization and ongoing recovery are:: go home          Expected Discharge Plan and Services In-house Referral: Clinical Social Work     Living arrangements for the past 2 months: Single Family Home                                      Prior Living Arrangements/Services Living arrangements for the past 2 months: Single Family Home Lives with:: Adult Children Patient language and need for interpreter reviewed:: Yes Do you feel safe going back to the place where you live?: Yes      Need for Family Participation in Patient Care: Yes (Comment) Care giver support system in place?: Yes (comment) Current home services: DME Criminal Activity/Legal Involvement Pertinent to Current Situation/Hospitalization: No - Comment as needed  Activities of Daily Living Home Assistive Devices/Equipment: Walker (specify type) ADL Screening (condition at time of admission) Patient's cognitive ability adequate to safely complete daily activities?: Yes Is the patient deaf or have difficulty hearing?: Yes ([name]) Does the patient have difficulty seeing, even when wearing glasses/contacts?: No Does the patient have difficulty  concentrating, remembering, or making decisions?: No Patient able to express need for assistance with ADLs?: Yes Does the patient have difficulty dressing or bathing?: No Independently performs ADLs?: Yes (appropriate for developmental age) Does the patient have difficulty walking or climbing stairs?: Yes Weakness of Legs: Both Weakness of Arms/Hands: None  Permission Sought/Granted                  Emotional Assessment       Orientation: : Oriented to Self, Oriented to Place, Oriented to  Time, Oriented to Situation Alcohol / Substance Use: Not Applicable Psych Involvement: No (comment)  Admission diagnosis:  SOB (shortness of breath) [R06.02] Pleural effusion [J90] Pneumonia due to infectious organism, unspecified laterality, unspecified part of lung [J18.9] Patient Active Problem List   Diagnosis Date Noted   GERD (gastroesophageal reflux disease) 06/10/2022   CAP (community acquired pneumonia) 06/10/2022   Chest pain 06/10/2022   Hypertensive crisis 06/09/2022   ESRD (end stage renal disease) ([place]) 06/09/2022   DMII (diabetes mellitus, type 2) ([place]) 06/09/2022   Multifocal pneumonia 06/02/2022   Pleural effusion 06/02/2022   End stage renal disease on dialysis ([place]) 06/02/2022   Spinal stenosis of lumbar region 01/07/2022   Schatzki's ring 10/15/2021   Esophageal dysphagia 10/15/2021   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   Hypertensive heart and CKD, ESRD on dialysis ([place]) 11/09/2020   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 due to type 2 diabetes  mellitus ([place]) 11/09/2020   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 diabetes mellitus ([place]) 11/09/2020   Dependence on renal dialysis ([place]) 11/09/2020   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 disease ([place]) 11/09/2020   Hyperlipidemia associated with type 2 diabetes mellitus ([place]) 11/09/2020   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 ([place]) 11/09/2020   Peripheral vascular disorder due  to diabetes mellitus ([place]) 11/09/2020   Chronic constipation 11/09/2020   Vitamin B 12 deficiency 11/09/2020   Acute respiratory failure with hypoxia ([place]) 10/26/2020   Elevated troponin 10/26/2020   Volume overload 10/26/2020   Hypertensive urgency 10/26/2020   History of CVA (cerebrovascular accident) 01/05/2020   Thrombocytopenia ([place]) 01/05/2020   History of colonic polyps 04/01/2018   Acute bronchitis 10/15/2017   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 ([place])    HCAP (healthcare-associated pneumonia) 05/29/2015   Hyperkalemia 05/29/2015   ESRD (end stage renal disease) on dialysis ([place])    Gastro-esophageal reflux disease without esophagitis 09/29/2014   Encounter for adequacy testing for hemodialysis ([place]) 01/17/2014   Mechanical complication of other vascular device, implant, and graft 01/17/2014   Disturbances of vision, late effect of stroke 11/03/2013   Right hemiparesis ([place]) 08/19/2013   CVA (cerebral infarction) 07/20/2013   Gastroparesis due to DM ([place]) 07/01/2013   Type 2 diabetes mellitus with hyperglycemia ([place]) 06/23/2013   Fall 05/25/2012   HLD (hyperlipidemia) 05/20/2006   Hereditary and idiopathic peripheral neuropathy 05/20/2006   Essential hypertension 05/20/2006   PCP:  Barbara Beard, MD Pharmacy:  No Pharmacies Listed    Social Determinants of Health (SDOH) Social History: SDOH Screenings   Food Insecurity: No Food Insecurity (06/02/2022)  Housing: Low Risk  (06/02/2022)  Transportation Needs: No Transportation Needs (06/02/2022)  Utilities: Not At Risk (06/02/2022)  Tobacco Use: Low Risk  (06/09/2022)   SDOH Interventions:     Readmission Risk Interventions    06/10/2022   11:01 AM 01/17/2020    9:17 AM  Readmission Risk Prevention Plan  Transportation Screening Complete Complete  PCP or Specialist Appt within 5-7 Days  Not Complete  Not Complete comments  pending disposition  Home Care Screening   Complete  Medication Review (RN CM)  Referral to Pharmacy  HRI or [place] Complete   Social Work Consult for [place] Planning/Counseling Complete   Palliative Care Screening Not Applicable   Medication Review Press photographer) Complete

## 2022-06-10 NOTE — Assessment & Plan Note (Addendum)
Nephrology consulted for hemodialysis while inpatient.

## 2022-06-10 NOTE — Assessment & Plan Note (Addendum)
Patient recently treated with Unasyn and azithromycin, prior to admission. On admission, patient was broadened to Vancomycin and Cefepime. Procalcitonin mildly elevated at 0.96. Afebrile with leukocytosis. Complicated by left pleural effusion. MRSA PCR negative. -Continue Vancomycin and transition Cefepime to Zosyn

## 2022-06-10 NOTE — Assessment & Plan Note (Addendum)
Concern for parapneumonic effusion. IR consulted and performed thoracentesis, yielding 1.2 L of serosanguinous fluid, followed by repeat thoracentesis with minimal amount of serosanguinous fluid and likely clot. Patient transferred to [facility] for pulmonology consult and consideration of chest tube. Concern for hemothorax. CT surgery has recommended CT guided chest tube. IR states chest tube cannot be placed until 06/18/2022. Patient remains on room air.

## 2022-06-10 NOTE — Procedures (Signed)
PreOperative Dx: LEFT pleural effusion Postoperative Dx: LEFT pleural effusion Procedure:   US guided LEFT thoracentesis Radiologist:  Thornton Papas Anesthesia:  10 ml of 1% lidocaine Specimen:  1.2 L of serosanguinous colored fluid EBL:   < 1 ml Complications:  None

## 2022-06-10 NOTE — Progress Notes (Signed)
Thoracentesis complete no signs of distress. 1232m pleural fluid removed. Patient taken to xray for post thoracentesis chest xray.

## 2022-06-10 NOTE — H&P (Signed)
History and Physical    Patient: [name] VXB:[id number] DOB: 10/12/1940 DOA: 06/09/2022 DOS: the patient was seen and examined on 06/10/2022 PCP: [name], MD  Patient coming from: Home  Chief Complaint:  Chief Complaint  Patient presents with   Shortness of Breath   HPI: SAROYA Howard is a 81 y.o. female with medical history significant of ESRD, diabetes mellitus type 2, GERD, hypertension, history of stroke, and more presents the ED with a chief complaint of dyspnea.  Patient reports that she has had dyspnea and left-sided chest pain.  Patient describes her chest pain as sharp.  It radiates from the left front chest wall around to her rib in the left left side of her back.  It hurts to cough.  Patient reports that she has had associated dyspnea.  The dyspnea is worse on exertion.  She has had new orthopnea over the past few days.  She does not wear oxygen at home.  Patient reports normal appetite, but early satiety.  She admits to nausea, but it sounds more like regurgitation when she talks about bringing her pills up after trying to swallow them.  She has a cough productive of yellow sputum.  She has not had another fever since she has been out of the hospital.  She does have generalized weakness.  Patient has no other complaints at this time.  Patient does not smoke, does not drink, does not use illicit drugs.  She is vaccinated for COVID.  Patient is full code. Review of Systems: As mentioned in the history of present illness. All other systems reviewed and are negative. Past Medical History:  Diagnosis Date   Anemia    Cataract    Chronic kidney disease    CVA (cerebral infarction)    Diabetes mellitus with ESRD (end-stage renal disease) ([place])    Type 2   Dialysis patient ([place])    M, W, F   Fistula    R arm   GERD (gastroesophageal reflux disease)    Hypertension    Renal disorder    Shortness of breath    Stroke [facility])    right side weakness   Past  Surgical History:  Procedure Laterality Date   AMPUTATION Right 01/13/2020   Procedure: AMPUTATION RIGHT FOOT 1ST RAY;  Surgeon: Newt Minion, MD;  Location: [place];  Service: Orthopedics;  Laterality: Right;   AV FISTULA PLACEMENT Right 09/08/2013   Procedure: CREATION OF RIGHT BRACHIAL CEPHALIC ARTERIOVENOUS FISTULA ;  Surgeon: Mal Misty, MD;  Location: [place];  Service: Vascular;  Laterality: Right;   [place] Right 01/26/2014   Procedure: Right Arm BASILIC VEIN TRANSPOSITION;  Surgeon: Mal Misty, MD;  Location: [place];  Service: Vascular;  Laterality: Right;   BIOPSY  09/10/2021   Procedure: BIOPSY;  Surgeon: Harvel Quale, MD;  [facility];  Service: Gastroenterology;;  gastric   BIOPSY  10/07/2021   Procedure: BIOPSY;  Surgeon: Irving Copas., MD;  [facility];  Service: Gastroenterology;;   BIOPSY  04/17/2022   Procedure: BIOPSY;  Surgeon: Irving Copas., MD;  Location: [facility];  Service: Gastroenterology;;   CATARACT EXTRACTION W/PHACO  11/20/2011   Procedure: CATARACT EXTRACTION PHACO AND INTRAOCULAR LENS PLACEMENT ([place]);  Surgeon: Tonny Branch, MD;  [facility];  Service: Ophthalmology;  Laterality: Right;  CDE 18.82   CATARACT EXTRACTION W/PHACO Left 11/18/2012   Procedure: CATARACT EXTRACTION PHACO AND INTRAOCULAR LENS PLACEMENT (IOC);  Surgeon:  Tonny Branch, MD;  [facility];  Service: Ophthalmology;  Laterality: Left;  CDE: 18.97   COLONOSCOPY N/A 02/09/2013   Procedure: COLONOSCOPY;  Surgeon: Rogene Houston, MD;  [facility];  Service: Endoscopy;  Laterality: N/A;  305-moved to 220 Ann to notify pt   COLONOSCOPY N/A 07/08/2018   Procedure: COLONOSCOPY;  Surgeon: Rogene Houston, MD;  [facility];  Service: Endoscopy;  Laterality: N/A;  930   ENDOSCOPIC MUCOSAL RESECTION N/A 10/07/2021   Procedure: ENDOSCOPIC MUCOSAL RESECTION;  Surgeon: Rush Landmark Telford Nab., MD;  [facility];  Service: Gastroenterology;  Laterality: N/A;   ESOPHAGOGASTRODUODENOSCOPY N/A 04/17/2022   Procedure: ESOPHAGOGASTRODUODENOSCOPY (EGD);  Surgeon: Irving Copas., MD;  Location: [facility];  Service: Gastroenterology;  Laterality: N/A;   ESOPHAGOGASTRODUODENOSCOPY (EGD) WITH PROPOFOL N/A 09/10/2021   Procedure: ESOPHAGOGASTRODUODENOSCOPY (EGD) WITH PROPOFOL;  Surgeon: Harvel Quale, MD;  [facility];  Service: Gastroenterology;  Laterality: N/A;   ESOPHAGOGASTRODUODENOSCOPY (EGD) WITH PROPOFOL N/A 10/07/2021   Procedure: ESOPHAGOGASTRODUODENOSCOPY (EGD) WITH PROPOFOL;  Surgeon: Rush Landmark Telford Nab., MD;  [facility];  Service: Gastroenterology;  Laterality: N/A;   EUS N/A 10/07/2021   Procedure: UPPER ENDOSCOPIC ULTRASOUND (EUS) RADIAL;  Surgeon: Irving Copas., MD;  [facility];  Service: Gastroenterology;  Laterality: N/A;   GASTRIC VARICES BANDING  10/07/2021   Procedure: duodenal tumor BANDING;  Surgeon: Rush Landmark Telford Nab., MD;  Location: [facility];  Service: Gastroenterology;;  with emr kit   HEMOSTASIS CLIP PLACEMENT  10/07/2021   Procedure: HEMOSTASIS CLIP PLACEMENT;  Surgeon: Irving Copas., MD;  [facility];  Service: Gastroenterology;;   INSERTION OF DIALYSIS CATHETER Right 06/24/2013   Procedure: INSERTION OF DIALYSIS CATHETER: Ultrasound guided;  Surgeon: Serafina Mitchell, MD;  Location: [place];  Service: Vascular;  Laterality: Right;   LIGATION OF ARTERIOVENOUS  FISTULA Right 01/26/2014   Procedure: LIGATION OF ARTERIOVENOUS  FISTULA;  Surgeon: Mal Misty, MD;  Location: [place];  Service: Vascular;  Laterality: Right;   LOOP RECORDER IMPLANT N/A 07/21/2013   Procedure: LOOP RECORDER IMPLANT;  Surgeon: Evans Lance, MD;  Location: [facility] C[facility] LAB;  Service: Cardiovascular;  Laterality: N/A;   POLYPECTOMY  07/08/2018   Procedure: POLYPECTOMY;  Surgeon: Rogene Houston, MD;  [facility];  Service: Endoscopy;;  Descending colon polyps x 2    SUBMUCOSAL LIFTING INJECTION  10/07/2021   Procedure: SUBMUCOSAL LIFTING INJECTION;  Surgeon: Irving Copas., MD;  [facility];  Service: Gastroenterology;;   TEE WITHOUT CARDIOVERSION N/A 07/21/2013   Procedure: TRANSESOPHAGEAL ECHOCARDIOGRAM (TEE);  Surgeon: Dorothy Spark, MD;  Location: [place];  Service: Cardiovascular;  Laterality: N/A;   UPPER ESOPHAGEAL ENDOSCOPIC ULTRASOUND (EUS) N/A 04/17/2022   Procedure: UPPER ESOPHAGEAL ENDOSCOPIC ULTRASOUND (EUS);  Surgeon: Irving Copas., MD;  Location: [facility];  Service: Gastroenterology;  Laterality: N/A;   Social History:  reports that she has never smoked. She has never used smokeless tobacco. She reports that she does not drink alcohol and does not use drugs.  Allergies  Allergen Reactions   Ambien [Zolpidem] Other (See Comments)    Hallucinations    Reglan [Metoclopramide] Other (See Comments)    "makes me crazy"    Family History  Problem Relation Age of Onset   Cancer Sister    Cancer Brother    Anesthesia problems Neg Hx    Hypotension Neg Hx    Malignant hyperthermia Neg Hx  Pseudochol deficiency Neg Hx     Prior to Admission medications   Medication Sig Start Date End Date Taking? Authorizing Provider  albuterol (VENTOLIN HFA) 108 (90 Base) MCG/ACT inhaler Inhale 2 puffs into the lungs every 6 (six) hours as needed for wheezing or shortness of breath. 11/21/20  Yes Gerlene Fee, NP  aspirin 325 MG tablet Take 650 mg by mouth daily. 07/09/18  Yes Rehman, Mechele Dawley, MD  benzonatate (TESSALON) 100 MG capsule Take 1 capsule (100 mg total) by mouth 3 (three) times daily as needed for cough. 06/03/22  Yes Shah, Pratik D, DO  cyanocobalamin (,VITAMIN B-12,) 1000 MCG/ML injection Inject 1,000 mcg into the muscle every 30 (thirty) days. 11/01/14  Yes [provider]  dextromethorphan-guaiFENesin (MUCINEX DM) 30-600 MG  12hr tablet Take 1 tablet by mouth 2 (two) times daily for 7 days. 06/03/22 06/10/22 Yes Shah, Pratik D, DO  diclofenac Sodium (VOLTAREN) 1 % GEL Apply 4 g topically 4 (four) times daily as needed. Apply to bilateral knees 03/16/21  Yes Scot Jun, FNP  insulin NPH-regular Human (HUMULIN 70/30) (70-30) 100 UNIT/ML injection Inject 5-14 Units into the skin 2 (two) times daily with a meal. Patient taking differently: Inject 5-10 Units into the skin 2 (two) times daily with a meal. Take 10 units in the morning and 5 units at bedtime 02/03/22  Yes Nida, Marella Chimes, MD  labetalol (NORMODYNE) 200 MG tablet Take 200 mg by mouth 2 (two) times daily. 07/26/21  Yes [provider]  multivitamin (RENA-VIT) TABS tablet Take 1 tablet by mouth at bedtime. 08/21/14  Yes Kirsteins, Luanna Salk, MD  omeprazole (PRILOSEC) 40 MG capsule TAKE ONE CAPSULE BY MOUTH TWICE DAILY BEFORE A meal Patient taking differently: Take 40 mg by mouth in the morning and at bedtime. 04/16/22  Yes Mansouraty, Telford Nab., MD  sevelamer carbonate (RENVELA) 800 MG tablet Take 1 tablet (800 mg total) by mouth 2 (two) times daily before lunch and supper. 11/21/20  Yes Gerlene Fee, NP  BD INSULIN SYRINGE U/F 31G X 5/16" 0.3 ML MISC Inject 1 each into the skin 2 (two) times daily. as directed 11/21/20   Gerlene Fee, NP    Physical Exam: Vitals:   06/09/22 2343 06/10/22 0000 06/10/22 0056 06/10/22 0129  BP: (!) 199/88 (!) 185/64 (!) 165/72   Pulse:  87 93   Resp:  (!) 22 (!) 24   Temp:  98.2 F (36.8 C) 98.4 F (36.9 C)   TempSrc:  Oral Oral   SpO2:  100% 98% 100%  Weight:      Height:       1.  General: Patient lying supine in bed,  no acute distress   2. Psychiatric: Alert and oriented x 3, mood and behavior normal for situation, pleasant and cooperative with exam   3. Neurologic: Speech and language are normal, face is symmetric, moves all 4 extremities voluntarily, at baseline without acute deficits on  limited exam   4. HEENMT:  Head is atraumatic, normocephalic, pupils reactive to light, neck is supple, trachea is midline, mucous membranes are moist   5. Respiratory : Lungs are clear to auscultation bilaterally without wheezing, rhonchi, minimal rales at the bases, increased work of breathing even on the nasal cannula  6. Cardiovascular : Heart rate normal, rhythm is regular, no murmurs, rubs or gallops, peripheral edema present, peripheral pulses palpated   7. Gastrointestinal:  Abdomen is soft, nondistended, nontender to palpation bowel sounds active, no  masses or organomegaly palpated   8. Skin:  Skin is warm, dry and intact without rashes, acute lesions, or ulcers on limited exam   9.Musculoskeletal:  No acute deformities or trauma, no asymmetry in tone, peripheral edema present, peripheral pulses palpated, no tenderness to palpation in the extremities  Data Reviewed: Temp 98.2, heart rate 98-1 04, respiratory rate 18-28, blood pressure 121/83-214/94, satting at 100% on 2 L nasal cannula Leukocytosis 16.4, hemoglobin 9.2, platelets 150 Chemistry reveals an elevated BUN at 28, elevated creatinine at 7.43 in this ESRD patient Mild hyperglycemia 187 Albumin 3.1 BNP 1518 Procalcitonin was 4.39 several days ago and has improved to 0.96, but is nonetheless still positive, patient still has a productive cough, and leukocytosis, broad-spectrum antibiotics started Chest x-ray shows moderate to large pleural effusion on the left side which is consistent with the pleuritic pain the patient is having.  Pulmonary edema also present on x-ray Admission requested for fluid overload/pleural effusion/HD in the a.m.   Assessment and Plan: * Pleural effusion - Most likely fluid overloaded secondary to ESRD - Dialysis schedule was adjusted due to Christmas being on a Monday, so patient had an extended time without dialysis - Plan for dialysis in the a.m. - Continue oxygen for patient  comfort - Patient does not make any urine, so no Lasix at this time - Continue to monitor  CAP (community acquired pneumonia) - Recently discharged on Unasyn and Zithromax for CAP - Is a lot of fluid on chest x-ray so versus pneumonia cannot be ruled out - Cover with HCAP antibiotics while procalcitonin is pending - Continue to monitor  GERD (gastroesophageal reflux disease) - Continue Protonix  DMII (diabetes mellitus, type 2) ([place]) - Patient takes 4765 at home - Continue with sliding scale coverage - Continue to monitor  ESRD (end stage renal disease) ([place]) - Plan for HD in the a.m. - Continue Renvela - Continue to monitor  Hypertensive crisis - Blood pressure as high as 214/94 - Likely related to fluid overload in the setting of known hypertension - IV hydralazine improve the blood pressure to 165/72 - Continue patient's home labetalol - Continue to monitor      Advance Care Planning:   Code Status: Full Code   Consults: Nephrology  Family Communication: Daughters at bedside  Severity of Illness: The appropriate patient status for this patient is INPATIENT. Inpatient status is judged to be reasonable and necessary in order to provide the required intensity of service to ensure the patient's safety. The patient's presenting symptoms, physical exam findings, and initial radiographic and laboratory data in the context of their chronic comorbidities is felt to place them at high risk for further clinical deterioration. Furthermore, it is not anticipated that the patient will be medically stable for discharge from the hospital within 2 midnights of admission.   * I certify that at the point of admission it is my clinical judgment that the patient will require inpatient hospital care spanning beyond 2 midnights from the point of admission due to high intensity of service, high risk for further deterioration and high frequency of surveillance required.*  Author: Rolla Plate, DO 06/10/2022 3:52 AM  For on call review [email or web address].

## 2022-06-10 NOTE — Progress Notes (Signed)
This RT was in another patient's room doing her 2 am treatment, when this patient called out stating she was having trouble breathing.  Patient on 2L, sat at 100%.  Did give patient PRN inhaler; 2puffs to see if that helped patient.

## 2022-06-10 NOTE — Assessment & Plan Note (Addendum)
Likely secondary to pulmonary edema. Resolved.

## 2022-06-10 NOTE — Assessment & Plan Note (Addendum)
Continue Protonix °

## 2022-06-10 NOTE — Progress Notes (Signed)
Patient calling out that she can't breathe. O2 checked. Patient at 100% on 2L nasal cannula. Respiratory called. Patient repositioned in bed. Will continue to monitor.

## 2022-06-10 NOTE — Assessment & Plan Note (Addendum)
Patient with blood pressure up to 214/94, treated with Hydralazine IV and hemodialysis. Resolved.

## 2022-06-10 NOTE — Procedures (Signed)
  HEMODIALYSIS TREATMENT NOTE:  Uneventful 3.5 hour heparin-free treatment completed using right upper arm AVF (15g/antegrade). Goal met: 2.5 liters removed without interruption in UF.  Vancomycin 769m given during last hour of therapy.  All blood was returned and hemostasis was achieved in 15 minutes.  Hand-off given to SAyesha Mohair RTherapist, sports  ARockwell Alexandria RN [facility]

## 2022-06-10 NOTE — Progress Notes (Signed)
Lab called and said the sputum collected from ED could not be used. Will try and recollect.

## 2022-06-10 NOTE — Plan of Care (Signed)
Patient seen and rounded on this morning.  Admitted after midnight. [name] is an 81 year old female with PMH ESRD on HD, DM II, GERD, HTN, history of stroke who presented with worsening dyspnea at home.  She has previously been hospitalized and treated for multifocal pneumonia and completed antibiotics.  Due to recurrent shortness of breath, she was brought back to the hospital for further evaluation.  There was some concern for ongoing pneumonia and she was resumed back on antibiotics.  CXR was also notable for a large left-sided pleural effusion. Procalcitonin was elevated however significantly down trended from prior hospitalization. Reviewed workup with patient and her daughter bedside this morning.  Recommended left thoracentesis for diagnostic and therapeutic purposes.  They were okay with this.  Plan: - Follow-up thoracentesis fluid studies - Continue antibiotics until fluid study cultures are back -Continue chronic HD during hospitalization -Eventually will need repeat chest imaging in ~4 weeks for further resolution of prior LUL opacity (still present on post thora CXR) - further plan as per H&P  Dwyane Dee, MD [facility] 06/10/2022, 1:30 PM

## 2022-06-10 NOTE — Assessment & Plan Note (Addendum)
Continue SSI. °

## 2022-06-11 DIAGNOSIS — J90 Pleural effusion, not elsewhere classified: Secondary | ICD-10-CM | POA: Diagnosis not present

## 2022-06-11 LAB — CBC WITH DIFFERENTIAL/PLATELET
Abs Immature Granulocytes: 0.17 10*3/uL — ABNORMAL HIGH (ref 0.00–0.07)
Basophils Absolute: 0 10*3/uL (ref 0.0–0.1)
Basophils Relative: 0 %
Eosinophils Absolute: 0.1 10*3/uL (ref 0.0–0.5)
Eosinophils Relative: 1 %
HCT: 24.2 % — ABNORMAL LOW (ref 36.0–46.0)
Hemoglobin: 8 g/dL — ABNORMAL LOW (ref 12.0–15.0)
Immature Granulocytes: 1 %
Lymphocytes Relative: 6 %
Lymphs Abs: 0.7 10*3/uL (ref 0.7–4.0)
MCH: 31.5 pg (ref 26.0–34.0)
MCHC: 33.1 g/dL (ref 30.0–36.0)
MCV: 95.3 fL (ref 80.0–100.0)
Monocytes Absolute: 1.4 10*3/uL — ABNORMAL HIGH (ref 0.1–1.0)
Monocytes Relative: 11 %
Neutro Abs: 10.2 10*3/uL — ABNORMAL HIGH (ref 1.7–7.7)
Neutrophils Relative %: 81 %
Platelets: 125 10*3/uL — ABNORMAL LOW (ref 150–400)
RBC: 2.54 MIL/uL — ABNORMAL LOW (ref 3.87–5.11)
RDW: 16.5 % — ABNORMAL HIGH (ref 11.5–15.5)
WBC: 12.6 10*3/uL — ABNORMAL HIGH (ref 4.0–10.5)
nRBC: 0.6 % — ABNORMAL HIGH (ref 0.0–0.2)

## 2022-06-11 LAB — CBC
HCT: 24.8 % — ABNORMAL LOW (ref 36.0–46.0)
Hemoglobin: 8.4 g/dL — ABNORMAL LOW (ref 12.0–15.0)
MCH: 31.9 pg (ref 26.0–34.0)
MCHC: 33.9 g/dL (ref 30.0–36.0)
MCV: 94.3 fL (ref 80.0–100.0)
Platelets: 120 10*3/uL — ABNORMAL LOW (ref 150–400)
RBC: 2.63 MIL/uL — ABNORMAL LOW (ref 3.87–5.11)
RDW: 16.7 % — ABNORMAL HIGH (ref 11.5–15.5)
WBC: 12.8 10*3/uL — ABNORMAL HIGH (ref 4.0–10.5)
nRBC: 0.5 % — ABNORMAL HIGH (ref 0.0–0.2)

## 2022-06-11 LAB — RENAL FUNCTION PANEL
Albumin: 2.5 g/dL — ABNORMAL LOW (ref 3.5–5.0)
Anion gap: 12 (ref 5–15)
BUN: 17 mg/dL (ref 8–23)
CO2: 28 mmol/L (ref 22–32)
Calcium: 7.7 mg/dL — ABNORMAL LOW (ref 8.9–10.3)
Chloride: 97 mmol/L — ABNORMAL LOW (ref 98–111)
Creatinine, Ser: 4.54 mg/dL — ABNORMAL HIGH (ref 0.44–1.00)
GFR, Estimated: 9 mL/min — ABNORMAL LOW (ref >60)
Glucose, Bld: 210 mg/dL — ABNORMAL HIGH (ref 70–99)
Phosphorus: 2.2 mg/dL — ABNORMAL LOW (ref 2.5–4.6)
Potassium: 3.7 mmol/L (ref 3.5–5.1)
Sodium: 137 mmol/L (ref 135–145)

## 2022-06-11 LAB — HEPATITIS B SURFACE ANTIBODY, QUANTITATIVE: Hep B S AB Quant (Post): 11.5 m[IU]/mL (ref >9.9)

## 2022-06-11 LAB — GLUCOSE, CAPILLARY
Glucose-Capillary: 223 mg/dL — ABNORMAL HIGH (ref 70–99)
Glucose-Capillary: 78 mg/dL (ref 70–99)
Glucose-Capillary: 201 mg/dL — ABNORMAL HIGH (ref 70–99)

## 2022-06-11 LAB — MAGNESIUM: Magnesium: 1.7 mg/dL (ref 1.7–2.4)

## 2022-06-11 MED ORDER — DIPHENHYDRAMINE HCL 50 MG/ML IJ SOLN
25.0000 mg | Freq: Once | INTRAMUSCULAR | Status: AC
  Administered 2022-06-11: 25 mg via INTRAVENOUS
  Filled 2022-06-11: qty 1

## 2022-06-11 MED ORDER — VANCOMYCIN HCL 750 MG/150ML IV SOLN
750.0000 mg | INTRAVENOUS | Status: DC
  Filled 2022-06-11: qty 150

## 2022-06-11 MED ORDER — SODIUM CHLORIDE 0.9 % IV SOLN
2.0000 g | INTRAVENOUS | Status: DC
  Administered 2022-06-11 – 2022-06-13 (×2): 2 g via INTRAVENOUS
  Filled 2022-06-11 (×2): qty 12.5

## 2022-06-11 NOTE — Progress Notes (Signed)
Patient ID: ZOII FLORER, female   DOB: 04/15/1941, 81 y.o.   MRN: [id number] S: Feels much better today. O:BP (!) 116/59 (BP Location: Left Arm)   Pulse 81   Temp 98 F (36.7 C) (Oral)   Resp 18   Ht '5\' 7"'$  (1.702 m)   Wt 74.5 kg   SpO2 96%   BMI 25.72 kg/m   Intake/Output Summary (Last 24 hours) at 06/11/2022 0955 Last data filed at 06/11/2022 0400 Gross per 24 hour  Intake 340.54 ml  Output 2500 ml  Net -2159.46 ml   Intake/Output: I/O last 3 completed shifts: In: 440.5 [P.O.:240; IV Piggyback:200.5] Out: 2500 [Other:2500]  Intake/Output this shift:  No intake/output data recorded. Weight change: -3.033 kg Gen: NAD CVS: RRR Resp:CTA Abd: +BS, soft, NT/ND Ext: no edema, RUE AVF +T/B  Recent Labs  Lab 06/09/22 2000 06/10/22 0353 06/11/22 0345  NA 139 139 137  K 3.5 3.9 3.7  CL 99 101 97*  CO2 '28 25 28  '$ GLUCOSE 187* 184* 210*  BUN 28* 31* 17  CREATININE 7.43* 7.73* 4.54*  ALBUMIN 3.1* 2.8* 2.5*  CALCIUM 8.3* 7.9* 7.7*  PHOS  --   --  2.2*  AST 20 17  --   ALT 13 13  --    Liver Function Tests: Recent Labs  Lab 06/09/22 2000 06/10/22 0353 06/11/22 0345  AST 20 17  --   ALT 13 13  --   ALKPHOS 68 58  --   BILITOT 0.5 0.7  --   PROT 7.3 6.4*  --   ALBUMIN 3.1* 2.8* 2.5*   No results for input(s): "LIPASE", "AMYLASE" in the last 168 hours. No results for input(s): "AMMONIA" in the last 168 hours. CBC: Recent Labs  Lab 06/09/22 2000 06/10/22 0353 06/11/22 0345  WBC 16.4* 16.9* 12.6*  NEUTROABS 13.0* 14.0* 10.2*  HGB 9.2* 8.3* 8.0*  HCT 27.8* 25.3* 24.2*  MCV 95.2 95.8 95.3  PLT 150 134* 125*   Cardiac Enzymes: No results for input(s): "CKTOTAL", "CKMB", "CKMBINDEX", "TROPONINI" in the last 168 hours. CBG: Recent Labs  Lab 06/04/22 1111 06/10/22 0809 06/10/22 1217 06/10/22 2126 06/11/22 0733  GLUCAP 207* 204* 140* 96 223*    Iron Studies: No results for input(s): "IRON", "TIBC", "TRANSFERRIN", "FERRITIN" in the last 72  hours. Studies/Results: US THORACENTESIS ASP PLEURAL SPACE W/IMG GUIDE  Result Date: 06/10/2022 Lavonia Dana, MD     06/10/2022 12:55 PM PreOperative Dx: LEFT pleural effusion Postoperative Dx: LEFT pleural effusion Procedure:   US guided LEFT thoracentesis Radiologist:  Thornton Papas Anesthesia:  10 ml of 1% lidocaine Specimen:  1.2 L of serosanguinous colored fluid EBL:   < 1 ml Complications:  None   DG Chest 1 View  Result Date: 06/10/2022 CLINICAL DATA:  LEFT pleural effusion post thoracentesis EXAM: CHEST  1 VIEW COMPARISON:  06/09/2022 FINDINGS: Stable heart size and mediastinal contours. Increased LEFT upper lobe opacity favor infiltrate. Decreased atelectasis and effusion at LEFT base. No pneumothorax following thoracentesis. RIGHT lung clear. Bones demineralized. IMPRESSION: Decrease in LEFT pleural effusion and basilar atelectasis post thoracentesis. No pneumothorax. Increased LEFT upper lobe opacity question infiltrate/pneumonia. Electronically Signed   By: Lavonia Dana M.D.   On: 06/10/2022 12:53   DG Chest 2 View  Result Date: 06/09/2022 CLINICAL DATA:  Shortness of breath EXAM: CHEST - 2 VIEW COMPARISON:  Chest x-ray dated June 01, 2022 FINDINGS: Visualized cardiac and mediastinal contours are unchanged. Moderate to large left pleural effusion, increased in size when  compared with prior exam. Previously described left upper lobe opacity is not well seen on today's exam, likely obscured by large effusion. Increased mild diffuse interstitial opacities. No evidence of pneumothorax. IMPRESSION: 1. Moderate to large left pleural effusion, increased in size when compared with prior exam. 2. Previously described left upper lobe opacity is not well seen on today's exam, likely obscured by large effusion. 3. Increased mild diffuse interstitial opacities, concerning for pulmonary edema Electronically Signed   By: Yetta Glassman M.D.   On: 06/09/2022 20:11    aspirin  650 mg Oral Daily    Chlorhexidine Gluconate Cloth  6 each Topical Q0600   dextromethorphan-guaiFENesin  1 tablet Oral BID   heparin  5,000 Units Subcutaneous Q8H   insulin aspart  0-15 Units Subcutaneous TID WC   insulin aspart  0-5 Units Subcutaneous QHS   labetalol  200 mg Oral BID   pantoprazole  40 mg Oral Daily   sevelamer carbonate  800 mg Oral BID AC   vancomycin variable dose per unstable renal function (pharmacist dosing)   Does not apply See admin instructions    BMET    Component Value Date/Time   NA 137 06/11/2022 0345   K 3.7 06/11/2022 0345   CL 97 (L) 06/11/2022 0345   CO2 28 06/11/2022 0345   GLUCOSE 210 (H) 06/11/2022 0345   BUN 17 06/11/2022 0345   CREATININE 4.54 (H) 06/11/2022 0345   CALCIUM 7.7 (L) 06/11/2022 0345   GFRNONAA 9 (L) 06/11/2022 0345   GFRAA 5 (L) 01/16/2020 0650   CBC    Component Value Date/Time   WBC 12.6 (H) 06/11/2022 0345   RBC 2.54 (L) 06/11/2022 0345   HGB 8.0 (L) 06/11/2022 0345   HCT 24.2 (L) 06/11/2022 0345   PLT 125 (L) 06/11/2022 0345   MCV 95.3 06/11/2022 0345   MCH 31.5 06/11/2022 0345   MCHC 33.1 06/11/2022 0345   RDW 16.5 (H) 06/11/2022 0345   LYMPHSABS 0.7 06/11/2022 0345   MONOABS 1.4 (H) 06/11/2022 0345   EOSABS 0.1 06/11/2022 0345   BASOSABS 0.0 06/11/2022 0345    Dialyzes at [facility] MWF  4 hours  EDW 75.5. HD Bath 2K, Dialyzer unknown, Heparin no. Access AVF right-  has post bleeding a lot.  Calcitriol 1 mcg q tx   Assessment/Plan:   SOB - markedly improved after HD yesterday with UF of 2.5 L, off of supplemental oxygen ESRD - will get back on MWF schedule today with short HD session and UF as tolerated. CAP - per primary HTN / volume - elevated initially but improved.  Will UF as tolerated.  DM type 2 - per primary Disposition - hopeful discharge to home after HD today.  Donetta Potts, MD [facility]

## 2022-06-11 NOTE — Progress Notes (Signed)
Received patient in bed to unit.  Alert and oriented.  Informed consent signed and in chart.   Treatment initiated: 1100 Treatment completed: 1400  Patient tolerated well.  Transported back to the room  Alert, without acute distress.  Hand-off given to patient's nurse.   Access used: AVF Access issues: none  Total UF removed: 2 L Medication(s) given: none Post HD VS: 112/50 P 84 R 16 . O2 sat 100 % in room air. Post HD weight: 71 Kg   [facility]

## 2022-06-11 NOTE — Plan of Care (Signed)
  Problem: Acute Rehab PT Goals(only PT should resolve) Goal: Patient Will Transfer Sit To/From Stand Outcome: Progressing [place] (Taken 06/11/2022 [phone]) Patient will transfer sit to/from stand:  with supervision  with min guard assist Goal: Pt Will Transfer Bed To Chair/Chair To Bed Outcome: Progressing [place] (Taken 06/11/2022 0937) Pt will Transfer Bed to Chair/Chair to Bed:  with supervision  min guard assist Goal: Pt Will Ambulate Outcome: Progressing Flow[place] (Taken 06/11/2022 0937) Pt will Ambulate:  50 feet  with min guard assist  with rolling walker  with supervision Goal: Pt/caregiver will Perform Home Exercise Program Outcome: Progressing [place] (Taken 06/11/2022 0937) Pt/caregiver will Perform Home Exercise Program:  For increased strengthening  For improved balance  With Supervision, verbal cues required/provided  9:38 AM, 06/11/22 Mearl Latin PT, DPT Physical Therapist at [facility]

## 2022-06-11 NOTE — Evaluation (Signed)
Physical Therapy Evaluation Patient Details Name: [name] MRN: [id number] DOB: 07/24/1940 Today's Date: 06/11/2022  History of Present Illness  [name] is a 81 y.o. female with medical history significant of ESRD, diabetes mellitus type 2, GERD, hypertension, history of stroke, and more presents the ED with a chief complaint of dyspnea.  Patient reports that she has had dyspnea and left-sided chest pain.  Patient describes her chest pain as sharp.  It radiates from the left front chest wall around to her rib in the left left side of her back.  It hurts to cough.  Patient reports that she has had associated dyspnea.  The dyspnea is worse on exertion.  She has had new orthopnea over the past few days.  She does not wear oxygen at home.  Patient reports normal appetite, but early satiety.  She admits to nausea, but it sounds more like regurgitation when she talks about bringing her pills up after trying to swallow them.  She has a cough productive of yellow sputum.  She has not had another fever since she has been out of the hospital.  She does have generalized weakness.  Patient has no other complaints at this time.    Clinical Impression  Patient limited for functional mobility as stated below secondary to BLE weakness, fatigue and impaired balance. Patient requiring minG/A with all mobility secondary to weakness. Patient ambulates with decreased cadence with RW and tends to drift to the right without loss of balance but some unsteadiness. Patient ends session seated in chair. Patient will benefit from continued physical therapy in hospital and recommended venue below to increase strength, balance, endurance for safe ADLs and gait.        Recommendations for follow up therapy are one component of a multi-disciplinary discharge planning process, led by the attending physician.  Recommendations may be updated based on patient status, additional functional criteria and insurance  authorization.  Follow Up Recommendations Home health PT      Assistance Recommended at Discharge Set up Supervision/Assistance  Patient can return home with the following  A little help with walking and/or transfers;A little help with bathing/dressing/bathroom;Help with stairs or ramp for entrance;Assistance with cooking/housework    Equipment Recommendations None recommended by PT  Recommendations for Other Services       Functional Status Assessment Patient has had a recent decline in their functional status and demonstrates the ability to make significant improvements in function in a reasonable and predictable amount of time.     Precautions / Restrictions Precautions Precautions: Fall Restrictions Weight Bearing Restrictions: No      Mobility  Bed Mobility Overal bed mobility: Needs Assistance Bed Mobility: Supine to Sit     Supine to sit: Min guard, Min assist, HOB elevated     General bed mobility comments: increased time, labored movement    Transfers Overall transfer level: Needs assistance Equipment used: Rolling walker (2 wheels) Transfers: Sit to/from Stand, Bed to chair/wheelchair/BSC Sit to Stand: Min guard, Min assist   Step pivot transfers: Min guard, Min assist       General transfer comment: slow labored movement, several attempts required with assist to power up to standing    Ambulation/Gait Ambulation/Gait assistance: Min guard, Min assist Gait Distance (Feet): 35 Feet Assistive device: Rolling walker (2 wheels) Gait Pattern/deviations: Decreased step length - right, Decreased step length - left, Decreased stride length Gait velocity: decreased     General Gait Details: slow labored cadence without loss of  balance, slighly unsteady, drifts to R  Stairs            Wheelchair Mobility    Modified Rankin (Stroke Patients Only)       Balance Overall balance assessment: Needs assistance Sitting-balance support: Feet  supported, No upper extremity supported Sitting balance-Leahy Scale: Good Sitting balance - Comments: seated at EOB   Standing balance support: During functional activity, Bilateral upper extremity supported, Reliant on assistive device for balance Standing balance-Leahy Scale: Fair Standing balance comment: using RW                             Pertinent Vitals/Pain Pain Assessment Pain Assessment: No/denies pain    Home Living Family/patient expects to be discharged to:: Private residence Living Arrangements: Children;Alone Available Help at Discharge: Available 24 hours/day Type of Home: House Home Access: Stairs to enter Entrance Stairs-Rails: Can reach both Entrance Stairs-Number of Steps: 3   Home Layout: One level Home Equipment: Shower seat;Wheelchair - Publishing copy (2 wheels)      Prior Function Prior Level of Function : Needs assist       Physical Assist : Mobility (physical);ADLs (physical) Mobility (physical): Bed mobility;Transfers;Stairs;Gait   Mobility Comments: patient stating household ambulation with RW ADLs Comments: Pt is assisted to wash her back when bathing. Set up assist for dressing. Pt reports ablity to toilet, groom, and feed without assist. Family assist with IADL's.     Hand Dominance   Dominant Hand: Right    Extremity/Trunk Assessment   Upper Extremity Assessment Upper Extremity Assessment: Generalized weakness    Lower Extremity Assessment Lower Extremity Assessment: Generalized weakness    Cervical / Trunk Assessment Cervical / Trunk Assessment: Normal  Communication   Communication: No difficulties  Cognition Arousal/Alertness: Awake/alert Behavior During Therapy: WFL for tasks assessed/performed Overall Cognitive Status: Within Functional Limits for tasks assessed                                          General Comments      Exercises     Assessment/Plan    PT Assessment  Patient needs continued PT services  PT Problem List Decreased strength;Decreased activity tolerance;Decreased balance;Decreased mobility       PT Treatment Interventions DME instruction;Gait training;Stair training;Functional mobility training;Therapeutic activities;Therapeutic exercise;Patient/family education;Balance training    PT Goals (Current goals can be found in the Care Plan section)  Acute Rehab PT Goals Patient Stated Goal: return home with family to assist PT Goal Formulation: With patient/family Time For Goal Achievement: 06/25/22 Potential to Achieve Goals: Good    Frequency Min 3X/week     Co-evaluation               AM-PAC PT "6 Clicks" Mobility  Outcome Measure Help needed turning from your back to your side while in a flat bed without using bedrails?: A Little Help needed moving from lying on your back to sitting on the side of a flat bed without using bedrails?: A Little Help needed moving to and from a bed to a chair (including a wheelchair)?: A Little Help needed standing up from a chair using your arms (e.g., wheelchair or bedside chair)?: A Little Help needed to walk in hospital room?: A Little Help needed climbing 3-5 steps with a railing? : A Lot 6 Click Score: 17    End  of Session Equipment Utilized During Treatment: Gait belt Activity Tolerance: Patient tolerated treatment well Patient left: in chair;with call bell/phone within reach Nurse Communication: Mobility status PT Visit Diagnosis: Unsteadiness on feet (R26.81);Other abnormalities of gait and mobility (R26.89);Muscle weakness (generalized) (M62.81)    Time: 6468-0321 PT Time Calculation (min) (ACUTE ONLY): 16 min   Charges:   PT Evaluation $PT Eval Low Complexity: 1 Low PT Treatments $Therapeutic Activity: 8-22 mins        9:36 AM, 06/11/22 Mearl Latin PT, DPT Physical Therapist at [facility]

## 2022-06-11 NOTE — Progress Notes (Signed)
PROGRESS NOTE     [name], is a 81 y.o. female, DOB - 02/02/1941, OEV:[id number]  Admit date - 06/09/2022   Admitting Physician Rolla Plate, DO  Outpatient Primary MD for the patient is [name], MD  LOS - 2  Chief Complaint  Patient presents with   Shortness of Breath        Brief Narrative:  81 y.o. female with medical history significant of ESRD, diabetes mellitus type 2, GERD, hypertension, history of stroke admitted on 06/07/2022 with left-sided pleural effusion and sided pneumonia and dyspnea   -Assessment and Plan: 1) left-sided CAP with left-sided pleural effusion -Status post left-sided thoracentesis on 06/10/2022 with removal of 1.2 L of pleural fluid -Fluid cell count elevated at 1335 cells, Gram stain and culture NGTD, postthoracentesis chest x-ray shows persistent left-upper lobe pneumonia -Patient was recently treated with Unasyn and azithromycin for CAP -Procalcitonin elevated -Continue vancomycin and cefepime for possible HCAP -Repeat two-view chest x-ray on 06/12/2022  2)ESRD concerns about volume overload on admission -Respiratory status improving after back-to-back hemodialysis sessions on 06/10/2022 on 06/11/2022 Nephrology consult appreciated -Repeat chest x-ray as above - 3)Pleural effusion - Secondary to pneumonia with parapneumonic effusion Versus fluid overload in the setting of ESRD  - -Patient does not make any urine and is unlikely to benefit from Lasix -Repeat chest x-ray as above #1 -Thoracentesis fluid studies pending -Antibiotics as above #1  4)Chest pain - Chest x-ray shows pulmonary edema - EKG shows sinus tachycardia with baseline wander - Pain is pleuritic in nature -Troponin is 36 similar to prior -No further chest pains  5)GERD (gastroesophageal reflux disease) - Continue Protonix  6)DMII (diabetes mellitus, type 2) (HCC) - Recent A1c 8.8 reflecting uncontrolled DM with hyperglycemia PTA Use  Novolog/Humalog Sliding scale insulin with Accu-Cheks/Fingersticks as ordered   7)Hypertensive crisis - Blood pressure as high as 214/94 - Likely related to fluid overload in the setting of known hypertension - IV hydralazine improve the blood pressure to 165/72 - Continue patient's home labetalol -BP improved after back-to-back hemodialysis sessions  8) chronic anemia of ESRD--- stable,  defer Procrit/ESA agent to nephrology team  Disposition--possible discharge home on 06/12/2022 on oral antibiotics pending repeat chest x-ray in a.m. Status is: Inpatient  Disposition: The patient is from: Home              Anticipated d/c is to: Home              Anticipated d/c date is: 1 day              Patient currently is not medically stable to d/c. Barriers: Not Clinically Stable-   Code Status :  -  Code Status: Full Code   Family Communication:    NA (patient is alert, awake and coherent)   DVT Prophylaxis  :   - SCDs  heparin injection 5,000 Units Start: 06/09/22 2230 SCDs Start: 06/09/22 2218   Lab Results  Component Value Date   PLT 125 (L) 06/11/2022    Inpatient Medications  Scheduled Meds:  aspirin  650 mg Oral Daily   Chlorhexidine Gluconate Cloth  6 each Topical Q0600   dextromethorphan-guaiFENesin  1 tablet Oral BID   heparin  5,000 Units Subcutaneous Q8H   insulin aspart  0-15 Units Subcutaneous TID WC   insulin aspart  0-5 Units Subcutaneous QHS   labetalol  200 mg Oral BID   pantoprazole  40 mg Oral Daily   sevelamer carbonate  800 mg Oral  BID AC   vancomycin variable dose per unstable renal function (pharmacist dosing)   Does not apply See admin instructions   Continuous Infusions:  ceFEPime (MAXIPIME) IV 2 g (06/11/22 1720)   vancomycin     PRN Meds:.acetaminophen **OR** acetaminophen, albuterol, lidocaine (PF), lidocaine-prilocaine, morphine injection, ondansetron **OR** ondansetron (ZOFRAN) IV, oxyCODONE, pentafluoroprop-tetrafluoroeth   Anti-infectives  (From admission, onward)    Start     Dose/Rate Route Frequency Ordered Stop   06/11/22 2200  vancomycin (VANCOREADY) IVPB 750 mg/150 mL        750 mg 150 mL/hr over 60 Minutes Intravenous Every M-W-F (Hemodialysis) 06/11/22 1559     06/11/22 1600  ceFEPIme (MAXIPIME) 2 g in sodium chloride 0.9 % 100 mL IVPB        2 g 200 mL/hr over 30 Minutes Intravenous Every M-W-F (Hemodialysis) 06/11/22 1312     06/10/22 2300  ceFEPIme (MAXIPIME) 1 g in sodium chloride 0.9 % 100 mL IVPB  Status:  Discontinued        1 g 200 mL/hr over 30 Minutes Intravenous Every 24 hours 06/09/22 2219 06/11/22 1312   06/10/22 1800  vancomycin (VANCOREADY) IVPB 750 mg/150 mL        750 mg 150 mL/hr over 60 Minutes Intravenous Every T-Th-Sa (Hemodialysis) 06/10/22 1302 06/10/22 2030   06/09/22 2230  ceFEPIme (MAXIPIME) 2 g in sodium chloride 0.9 % 100 mL IVPB        2 g 200 mL/hr over 30 Minutes Intravenous  Once 06/09/22 2219 06/10/22 0005   06/09/22 2230  vancomycin (VANCOREADY) IVPB 1750 mg/350 mL        1,750 mg 175 mL/hr over 120 Minutes Intravenous  Once 06/09/22 2219 06/10/22 0327   06/09/22 2219  vancomycin variable dose per unstable renal function (pharmacist dosing)         Does not apply See admin instructions 06/09/22 2219           Subjective: [name] today has no fevers, no emesis,  No further chest pain,   - Dyspnea improving - Tolerated hemodialysis well  Objective: Vitals:   06/11/22 1330 06/11/22 1400 06/11/22 1411 06/11/22 1545  BP: (!) 106/53 (!) 112/50  125/60  Pulse: 79 80  82  Resp: '14 16  15  '$ Temp:  98.3 F (36.8 C)  98.4 F (36.9 C)  TempSrc:  Oral  Oral  SpO2:    100%  Weight:   71 kg   Height:        Intake/Output Summary (Last 24 hours) at 06/11/2022 1826 Last data filed at 06/11/2022 1756 Gross per 24 hour  Intake 682.34 ml  Output 4500 ml  Net -3817.66 ml   Filed Weights   06/10/22 2100 06/11/22 1110 06/11/22 1411  Weight: 74.5 kg 73 kg 71 kg     Physical Exam  Gen:- Awake Alert, no acute distress HEENT:- [place].AT, No sclera icterus Ears-HOH Neck-Supple Neck,No JVD,.  Lungs-improving air movement, no wheezing  CV- S1, S2 normal, regular  Abd-  +ve B.Sounds, Abd Soft, No tenderness,    Extremity/Skin:- +ve  edema, pedal pulses present  Psych-affect is appropriate, oriented x3 Neuro-no new focal deficits, no tremors MSK-right arm AV fistula  Data Reviewed: I have personally reviewed following labs and imaging studies  CBC: Recent Labs  Lab 06/09/22 2000 06/10/22 0353 06/11/22 0345  WBC 16.4* 16.9* 12.6*  NEUTROABS 13.0* 14.0* 10.2*  HGB 9.2* 8.3* 8.0*  HCT 27.8* 25.3* 24.2*  MCV 95.2 95.8 95.3  PLT 150 134* 644*   Basic Metabolic Panel: Recent Labs  Lab 06/09/22 2000 06/10/22 0353 06/11/22 0345  NA 139 139 137  K 3.5 3.9 3.7  CL 99 101 97*  CO2 '28 25 28  '$ GLUCOSE 187* 184* 210*  BUN 28* 31* 17  CREATININE 7.43* 7.73* 4.54*  CALCIUM 8.3* 7.9* 7.7*  MG  --  2.1 1.7  PHOS  --   --  2.2*   GFR: Estimated Creatinine Clearance: 9.3 mL/min (A) (by C-G formula based on SCr of 4.54 mg/dL (H)). Liver Function Tests: Recent Labs  Lab 06/09/22 2000 06/10/22 0353 06/11/22 0345  AST 20 17  --   ALT 13 13  --   ALKPHOS 68 58  --   BILITOT 0.5 0.7  --   PROT 7.3 6.4*  --   ALBUMIN 3.1* 2.8* 2.5*   Cardiac Enzymes: No results for input(s): "CKTOTAL", "CKMB", "CKMBINDEX", "TROPONINI" in the last 168 hours. BNP (last 3 results) No results for input(s): "PROBNP" in the last 8760 hours. HbA1C: No results for input(s): "HGBA1C" in the last 72 hours. Sepsis Labs: '@LABRCNTIP'$ (procalcitonin:4,lacticidven:4) ) Recent Results (from the past 240 hour(s))  Resp panel by RT-PCR (RSV, Flu A&B, Covid) Anterior Nasal Swab     Status: None   Collection Time: 06/01/22  8:03 PM   Specimen: Anterior Nasal Swab  Result Value Ref Range Status   SARS Coronavirus 2 by RT PCR NEGATIVE NEGATIVE Final    Comment:  (NOTE) SARS-CoV-2 target nucleic acids are NOT DETECTED.  The SARS-CoV-2 RNA is generally detectable in upper respiratory specimens during the acute phase of infection. The lowest concentration of SARS-CoV-2 viral copies this assay can detect is 138 copies/mL. A negative result does not preclude SARS-Cov-2 infection and should not be used as the sole basis for treatment or other patient management decisions. A negative result may occur with  improper specimen collection/handling, submission of specimen other than nasopharyngeal swab, presence of viral mutation(s) within the areas targeted by this assay, and inadequate number of viral copies(<138 copies/mL). A negative result must be combined with clinical observations, patient history, and epidemiological information. The expected result is Negative.  Fact Sheet for Patients:  EntrepreneurPulse.com.au  Fact Sheet for Healthcare Providers:  IncredibleEmployment.be  This test is no t yet approved or cleared by the [place] FDA and  has been authorized for detection and/or diagnosis of SARS-CoV-2 by FDA under an Emergency Use Authorization (EUA). This EUA will remain  in effect (meaning this test can be used) for the duration of the COVID-19 declaration under Section 564(b)(1) of the Act, 21 U.S.C.section 360bbb-3(b)(1), unless the authorization is terminated  or revoked sooner.       Influenza A by PCR NEGATIVE NEGATIVE Final   Influenza B by PCR NEGATIVE NEGATIVE Final    Comment: (NOTE) The Xpert Xpress SARS-CoV-2/FLU/RSV plus assay is intended as an aid in the diagnosis of influenza from Nasopharyngeal swab specimens and should not be used as a sole basis for treatment. Nasal washings and aspirates are unacceptable for Xpert Xpress SARS-CoV-2/FLU/RSV testing.  Fact Sheet for Patients: EntrepreneurPulse.com.au  Fact Sheet for Healthcare  Providers: IncredibleEmployment.be  This test is not yet approved or cleared by the [place] FDA and has been authorized for detection and/or diagnosis of SARS-CoV-2 by FDA under an Emergency Use Authorization (EUA). This EUA will remain in effect (meaning this test can be used) for the duration of the COVID-19 declaration under Section 564(b)(1) of the Act, 21 U.S.C.  section 360bbb-3(b)(1), unless the authorization is terminated or revoked.     Resp Syncytial Virus by PCR NEGATIVE NEGATIVE Final    Comment: (NOTE) Fact Sheet for Patients: EntrepreneurPulse.com.au  Fact Sheet for Healthcare Providers: IncredibleEmployment.be  This test is not yet approved or cleared by the [place] FDA and has been authorized for detection and/or diagnosis of SARS-CoV-2 by FDA under an Emergency Use Authorization (EUA). This EUA will remain in effect (meaning this test can be used) for the duration of the COVID-19 declaration under Section 564(b)(1) of the Act, 21 U.S.C. section 360bbb-3(b)(1), unless the authorization is terminated or revoked.  Performed at [facility], [address], [place], [place] [address]   Blood culture (routine x 2)     Status: None   Collection Time: 06/02/22 12:37 AM   Specimen: BLOOD LEFT HAND  Result Value Ref Range Status   Specimen Description BLOOD LEFT HAND BOTTLES DRAWN AEROBIC ONLY  Final   Special Requests Blood Culture adequate volume  Final   Culture   Final    NO GROWTH 5 DAYS Performed at [facility], [address], [place], [place] [address]    Report Status 06/07/2022 FINAL  Final  Blood culture (routine x 2)     Status: None   Collection Time: 06/02/22 12:44 AM   Specimen: BLOOD LEFT ARM  Result Value Ref Range Status   Specimen Description BLOOD LEFT ARM BOTTLES DRAWN AEROBIC AND ANAEROBIC  Final   Special Requests Blood Culture adequate volume  Final   Culture   Final    NO  GROWTH 5 DAYS Performed at [facility], [address], [place], [place] [address]    Report Status 06/07/2022 FINAL  Final  Expectorated Sputum Assessment w Gram Stain, Rflx to Resp Cult     Status: None   Collection Time: 06/09/22 12:04 AM   Specimen: Expectorated Sputum  Result Value Ref Range Status   Specimen Description EXPECTORATED SPUTUM  Final   Special Requests NONE  Final   Sputum evaluation   Final    Sputum specimen not acceptable for testing.  Please recollect.   CALL HARDIN,S. ON 06/10/22 '@0228'$  BY [facility] Performed at [facility], [address] [place], [place] [address]    Report Status 06/10/2022 FINAL  Final  Resp panel by RT-PCR (RSV, Flu A&B, Covid) Anterior Nasal Swab     Status: None   Collection Time: 06/09/22 10:17 PM   Specimen: Anterior Nasal Swab  Result Value Ref Range Status   SARS Coronavirus 2 by RT PCR NEGATIVE NEGATIVE Final    Comment: (NOTE) SARS-CoV-2 target nucleic acids are NOT DETECTED.  The SARS-CoV-2 RNA is generally detectable in upper respiratory specimens during the acute phase of infection. The lowest concentration of SARS-CoV-2 viral copies this assay can detect is 138 copies/mL. A negative result does not preclude SARS-Cov-2 infection and should not be used as the sole basis for treatment or other patient management decisions. A negative result may occur with  improper specimen collection/handling, submission of specimen other than nasopharyngeal swab, presence of viral mutation(s) within the areas targeted by this assay, and inadequate number of viral copies(<138 copies/mL). A negative result must be combined with clinical observations, patient history, and epidemiological information. The expected result is Negative.  Fact Sheet for Patients:  EntrepreneurPulse.com.au  Fact Sheet for Healthcare Providers:  IncredibleEmployment.be  This test is no t yet approved or cleared by the [place] FDA and  has been authorized for detection  and/or diagnosis of SARS-CoV-2 by FDA under an Emergency Use Authorization (EUA). This EUA will remain  in effect (meaning this test can be used) for the duration of the COVID-19 declaration under Section 564(b)(1) of the Act, 21 U.S.C.section 360bbb-3(b)(1), unless the authorization is terminated  or revoked sooner.       Influenza A by PCR NEGATIVE NEGATIVE Final   Influenza B by PCR NEGATIVE NEGATIVE Final    Comment: (NOTE) The Xpert Xpress SARS-CoV-2/FLU/RSV plus assay is intended as an aid in the diagnosis of influenza from Nasopharyngeal swab specimens and should not be used as a sole basis for treatment. Nasal washings and aspirates are unacceptable for Xpert Xpress SARS-CoV-2/FLU/RSV testing.  Fact Sheet for Patients: EntrepreneurPulse.com.au  Fact Sheet for Healthcare Providers: IncredibleEmployment.be  This test is not yet approved or cleared by the [place] FDA and has been authorized for detection and/or diagnosis of SARS-CoV-2 by FDA under an Emergency Use Authorization (EUA). This EUA will remain in effect (meaning this test can be used) for the duration of the COVID-19 declaration under Section 564(b)(1) of the Act, 21 U.S.C. section 360bbb-3(b)(1), unless the authorization is terminated or revoked.     Resp Syncytial Virus by PCR NEGATIVE NEGATIVE Final    Comment: (NOTE) Fact Sheet for Patients: EntrepreneurPulse.com.au  Fact Sheet for Healthcare Providers: IncredibleEmployment.be  This test is not yet approved or cleared by the [place] FDA and has been authorized for detection and/or diagnosis of SARS-CoV-2 by FDA under an Emergency Use Authorization (EUA). This EUA will remain in effect (meaning this test can be used) for the duration of the COVID-19 declaration under Section 564(b)(1) of the Act, 21 U.S.C. section  360bbb-3(b)(1), unless the authorization is terminated or revoked.  Performed at [facility], [address], [place], [place] [address]   Culture, blood (routine x 2) Call MD if unable to obtain prior to antibiotics being given     Status: None (Preliminary result)   Collection Time: 06/09/22 10:48 PM   Specimen: BLOOD LEFT ARM  Result Value Ref Range Status   Specimen Description BLOOD LEFT ARM BOTTLES DRAWN AEROBIC AND ANAEROBIC  Final   Special Requests Blood Culture adequate volume  Final   Culture   Final    NO GROWTH 2 DAYS Performed at [facility], [address], [place], [place] [address]    Report Status PENDING  Incomplete  Culture, blood (routine x 2) Call MD if unable to obtain prior to antibiotics being given     Status: None (Preliminary result)   Collection Time: 06/09/22 10:49 PM   Specimen: Site Not Specified; Blood  Result Value Ref Range Status   Specimen Description   Final    SITE NOT SPECIFIED BOTTLES DRAWN AEROBIC AND ANAEROBIC   Special Requests Blood Culture adequate volume  Final   Culture   Final    NO GROWTH 2 DAYS Performed at [facility], [address] [place], [place] [address]    Report Status PENDING  Incomplete  Culture, body fluid w Gram Stain-bottle     Status: None (Preliminary result)   Collection Time: 06/10/22 11:35 AM   Specimen: Pleura  Result Value Ref Range Status   Specimen Description PLEURAL  Final   Special Requests BOTTLES DRAWN AEROBIC AND ANAEROBIC 10CC  Final   Culture   Final    NO GROWTH < 24 HOURS Performed at [facility], [address], [place], [place] [address]    Report Status PENDING  Incomplete  Gram stain     Status: None   Collection Time: 06/10/22 11:35 AM   Specimen: Pleura  Result Value Ref Range Status   Specimen Description PLEURAL  Final   Special Requests BOTTLES DRAWN AEROBIC AND ANAEROBIC 10 CC  Final   Gram Stain   Final    NO ORGANISMS SEEN WBC PRESENT,BOTH PMN AND MONONUCLEAR CYTOSPIN  SMEAR Performed at [facility], [address], [place], [place] [address]    Report Status 06/10/2022 FINAL  Final  MRSA Next Gen by PCR, Nasal     Status: None   Collection Time: 06/10/22  5:00 PM   Specimen: Nasal Mucosa; Nasal Swab  Result Value Ref Range Status   MRSA by PCR Next Gen NOT DETECTED NOT DETECTED Final    Comment: (NOTE) The GeneXpert MRSA Assay (FDA approved for NASAL specimens only), is one component of a comprehensive MRSA colonization surveillance program. It is not intended to diagnose MRSA infection nor to guide or monitor treatment for MRSA infections. Test performance is not FDA approved in patients less than 9 years old. Performed at [facility], [address] [place], [place] [address]       Radiology Studies: US THORACENTESIS ASP PLEURAL SPACE W/IMG GUIDE  Result Date: 06/10/2022 INDICATION: LEFT pleural effusion EXAM: ULTRASOUND GUIDED DIAGNOSTIC AND THERAPEUTIC LEFT THORACENTESIS MEDICATIONS: None. COMPLICATIONS: None immediate. PROCEDURE: An ultrasound guided thoracentesis was thoroughly discussed with the patient and questions answered. The benefits, risks, alternatives and complications were also discussed. The patient understands and wishes to proceed with the procedure. Written consent was obtained. Ultrasound was performed to localize and mark an adequate pocket of fluid in the LEFT chest. The area was then prepped and draped in the normal sterile fashion. 1% Lidocaine was used for local anesthesia. Under ultrasound guidance a 6 Fr Safe-T-Centesis catheter was introduced. Thoracentesis was performed. The catheter was removed and a dressing applied. FINDINGS: A total of approximately 1.2 L of serosanguineous fluid was removed. Samples were sent to the laboratory as requested by the clinical team. IMPRESSION: Successful ultrasound guided LEFT thoracentesis yielding 1.2 L of pleural fluid. Electronically Signed   By: Lavonia Dana M.D.   On: 06/10/2022 12:55    DG Chest 1 View  Result Date: 06/10/2022 CLINICAL DATA:  LEFT pleural effusion post thoracentesis EXAM: CHEST  1 VIEW COMPARISON:  06/09/2022 FINDINGS: Stable heart size and mediastinal contours. Increased LEFT upper lobe opacity favor infiltrate. Decreased atelectasis and effusion at LEFT base. No pneumothorax following thoracentesis. RIGHT lung clear. Bones demineralized. IMPRESSION: Decrease in LEFT pleural effusion and basilar atelectasis post thoracentesis. No pneumothorax. Increased LEFT upper lobe opacity question infiltrate/pneumonia. Electronically Signed   By: Lavonia Dana M.D.   On: 06/10/2022 12:53   DG Chest 2 View  Result Date: 06/09/2022 CLINICAL DATA:  Shortness of breath EXAM: CHEST - 2 VIEW COMPARISON:  Chest x-ray dated June 01, 2022 FINDINGS: Visualized cardiac and mediastinal contours are unchanged. Moderate to large left pleural effusion, increased in size when compared with prior exam. Previously described left upper lobe opacity is not well seen on today's exam, likely obscured by large effusion. Increased mild diffuse interstitial opacities. No evidence of pneumothorax. IMPRESSION: 1. Moderate to large left pleural effusion, increased in size when compared with prior exam. 2. Previously described left upper lobe opacity is not well seen on today's exam, likely obscured by large effusion. 3. Increased mild diffuse interstitial opacities, concerning for pulmonary edema Electronically Signed   By: Hosie Poisson.D.  On: 06/09/2022 20:11     Scheduled Meds:  aspirin  650 mg Oral Daily   Chlorhexidine Gluconate Cloth  6 each Topical Q0600   dextromethorphan-guaiFENesin  1 tablet Oral BID   heparin  5,000 Units Subcutaneous Q8H   insulin aspart  0-15 Units Subcutaneous TID WC   insulin aspart  0-5 Units Subcutaneous QHS   labetalol  200 mg Oral BID   pantoprazole  40 mg Oral Daily   sevelamer carbonate  800 mg Oral BID AC   vancomycin variable dose per unstable  renal function (pharmacist dosing)   Does not apply See admin instructions   Continuous Infusions:  ceFEPime (MAXIPIME) IV 2 g (06/11/22 1720)   vancomycin       LOS: 2 days    Roxan Hockey M.D on 06/11/2022 at 6:26 PM  Go to [email or web address] - for contact info  [facility] - Office  [phone]  If 7PM-7AM, please contact night-coverage [email or web address] 06/11/2022, 6:26 PM

## 2022-06-12 ENCOUNTER — Inpatient Hospital Stay (HOSPITAL_COMMUNITY): Payer: 59

## 2022-06-12 DIAGNOSIS — J90 Pleural effusion, not elsewhere classified: Secondary | ICD-10-CM | POA: Diagnosis not present

## 2022-06-12 LAB — CBC WITH DIFFERENTIAL/PLATELET
Abs Immature Granulocytes: 0.11 10*3/uL — ABNORMAL HIGH (ref 0.00–0.07)
Basophils Absolute: 0 10*3/uL (ref 0.0–0.1)
Basophils Relative: 0 %
Eosinophils Absolute: 0.1 10*3/uL (ref 0.0–0.5)
Eosinophils Relative: 1 %
HCT: 24.1 % — ABNORMAL LOW (ref 36.0–46.0)
Hemoglobin: 8.1 g/dL — ABNORMAL LOW (ref 12.0–15.0)
Immature Granulocytes: 1 %
Lymphocytes Relative: 6 %
Lymphs Abs: 0.8 10*3/uL (ref 0.7–4.0)
MCH: 31.4 pg (ref 26.0–34.0)
MCHC: 33.6 g/dL (ref 30.0–36.0)
MCV: 93.4 fL (ref 80.0–100.0)
Monocytes Absolute: 1.6 10*3/uL — ABNORMAL HIGH (ref 0.1–1.0)
Monocytes Relative: 12 %
Neutro Abs: 11.2 10*3/uL — ABNORMAL HIGH (ref 1.7–7.7)
Neutrophils Relative %: 80 %
Platelets: 132 10*3/uL — ABNORMAL LOW (ref 150–400)
RBC: 2.58 MIL/uL — ABNORMAL LOW (ref 3.87–5.11)
RDW: 16.7 % — ABNORMAL HIGH (ref 11.5–15.5)
WBC: 13.9 10*3/uL — ABNORMAL HIGH (ref 4.0–10.5)
nRBC: 0.3 % — ABNORMAL HIGH (ref 0.0–0.2)

## 2022-06-12 LAB — CYTOLOGY - NON PAP

## 2022-06-12 LAB — GLUCOSE, CAPILLARY
Glucose-Capillary: 156 mg/dL — ABNORMAL HIGH (ref 70–99)
Glucose-Capillary: 172 mg/dL — ABNORMAL HIGH (ref 70–99)
Glucose-Capillary: 112 mg/dL — ABNORMAL HIGH (ref 70–99)
Glucose-Capillary: 151 mg/dL — ABNORMAL HIGH (ref 70–99)

## 2022-06-12 LAB — MAGNESIUM: Magnesium: 1.7 mg/dL (ref 1.7–2.4)

## 2022-06-12 MED ORDER — VANCOMYCIN HCL 750 MG/150ML IV SOLN
750.0000 mg | Freq: Once | INTRAVENOUS | Status: AC
  Administered 2022-06-12: 750 mg via INTRAVENOUS
  Filled 2022-06-12: qty 150

## 2022-06-12 MED ORDER — LIDOCAINE HCL (PF) 2 % IJ SOLN
INTRAMUSCULAR | Status: AC
  Filled 2022-06-12: qty 10

## 2022-06-12 MED ORDER — HYDROXYZINE HCL 25 MG PO TABS
25.0000 mg | ORAL_TABLET | Freq: Three times a day (TID) | ORAL | Status: DC | PRN
  Administered 2022-06-12 – 2022-06-30 (×7): 25 mg via ORAL
  Filled 2022-06-12 (×9): qty 1

## 2022-06-12 NOTE — Procedures (Signed)
PreOperative Dx: Recurrent LEFT pleural effusion Postoperative Dx: Recurrent LEFT pleural effusion Procedure:   US guided LEFT thoracentesis Radiologist:  Thornton Papas Anesthesia:  10 ml of 1% lidocaine Specimen:  100 mL of serosanguinous to bloody colored fluid - effusion loculated, septated, echogenic, likely clot EBL:   < 1 ml Complications:  None

## 2022-06-12 NOTE — Progress Notes (Signed)
SATURATION QUALIFICATIONS: (This note is used to comply with regulatory documentation for home oxygen)  Patient Saturations on Room Air at Rest = 100%  Patient Saturations on Room Air while Ambulating = 98%  Patient Saturations on 0 Liters of oxygen while Ambulating N/A  Please briefly explain why patient needs home oxygen:

## 2022-06-12 NOTE — Progress Notes (Signed)
Report was given to [place].

## 2022-06-12 NOTE — TOC Progression Note (Signed)
Transition of Care [facility]) - Progression Note    Patient Details  Name: [name] MRN: [id number] Date of Birth: 31-Mar-1941  Transition of Care [facility]) CM/SW [place], [place] Phone Number: 06/12/2022, 4:05 PM  Clinical Narrative:    CSW spoke with pts daughter who states she is agreeable to [facility] at D/C. CSW also updated that pt will be transferring to [facility] at this time. TOC to continue following.   Expected Discharge Plan: Home/Self Care Barriers to Discharge: Continued Medical Work up  Expected Discharge Plan and Services In-house Referral: Clinical Social Work     Living arrangements for the past 2 months: Single Family Home Expected Discharge Date: 06/12/22                         [facility] Arranged: PT   Date [place]: 06/12/22       Social Determinants of Health ([place]) Interventions [place]: No Food Insecurity (06/02/2022)  Housing: Low Risk  (06/02/2022)  Transportation Needs: No Transportation Needs (06/02/2022)  Utilities: Not At Risk (06/02/2022)  Tobacco Use: Low Risk  (06/10/2022)    Readmission Risk Interventions    06/12/2022    3:57 PM 06/10/2022   11:01 AM 01/17/2020    9:17 AM  Readmission Risk Prevention Plan  Transportation Screening Complete Complete Complete  PCP or Specialist Appt within 5-7 Days   Not Complete  Not Complete comments   pending disposition  Home Care Screening   Complete  Medication Review (RN CM)   Referral to Pharmacy  [place] or [place] Complete Complete   Social Work Consult for [place] Planning/Counseling Complete Complete   Palliative Care Screening Not Applicable Not Applicable   Medication Review Press photographer) Complete Complete

## 2022-06-12 NOTE — Consult Note (Signed)
   [facility] Inpatient Consult   06/12/2022  [name] 07/27/1940 [id number]  [place] Organization [ACO] Patient: [facility] Liaison remote coverage review for [name]  Primary Care Provider:  [name], MD [facility], confirmed by daughter [name]  Patient screened for less than 7 days  hospitalization with noted  high risk score for unplanned readmission risk to assess for potential [facility] service needs for post hospital transition for care coordination.  Review of patient's electronic medical record reveals patient is to transition home today.  Call placed to the phone number provided in demographic and spoke with [name], daughter, HIPAA verified and on DPR.  She states she is going to make a follow up appointment for her mom once she knows her scheduled. Explained reason for call and for high risk follow up and she agrees to follow up.   Plan:  Referral request for community care coordination:  post hospital high risk for readmission prevention needs.  Of note, [facility] Care Management/Population Health does not replace or interfere with any arrangements made by the Inpatient Transition of Care team.  For questions contact:   Barbara Brood, RN BSN [place]  [phone] business mobile phone Toll free office [phone]  *[place]  [phone] Fax number: [phone] [name]

## 2022-06-12 NOTE — Care Management Important Message (Signed)
Important Message  Patient Details  Name: [name] MRN: [id number] Date of Birth: 10/08/1940   Medicare Important Message Given:  Other (see comment) (unable to reach by phone at [phone], [phone], copy mailed to address on file due to contact precautions)     Tommy Medal 06/12/2022, 12:30 PM

## 2022-06-12 NOTE — Progress Notes (Signed)
[facility] Progress Note   Assessment/ Plan:   Dialyzes at [facility] MWF  4 hours  EDW 75.5. HD Bath 2K, Dialyzer unknown, Heparin no. Access AVF right-  has post bleeding a lot.  Calcitriol 1 mcg q tx   Assessment/Plan:   SOB - markedly improved after HD.  off of supplemental oxygen.  Has L sided loculated pleural effusion, thora done today with 100 mL off. ESRD - On schedule MWF, next Friday HCAP- on vanc/ cefepime HTN / volume - elevated initially but improved.  Will UF as tolerated.  DM type 2 - per primary Disposition - pending  Subjective:    Seen in room.  Had HD yesterday on schedule.  Has a loculated L pleural effusion, had 100 mL removed and looks like possible clot   Objective:   BP (!) 107/52 (BP Location: Left Arm)   Pulse 78   Temp 98.6 F (37 C) (Oral)   Resp 18   Ht '5\' 7"'$  (1.702 m)   Wt 71 kg   SpO2 100%   BMI 24.52 kg/m   Physical Exam: Gen: NAD CVS: RRR Resp: normal WOB Abd: soft Ext: no LE edema ACCESS: R AVF + T/B  Labs: BMET Recent Labs  Lab 06/09/22 2000 06/10/22 0353 06/11/22 0345  NA 139 139 137  K 3.5 3.9 3.7  CL 99 101 97*  CO2 '28 25 28  '$ GLUCOSE 187* 184* 210*  BUN 28* 31* 17  CREATININE 7.43* 7.73* 4.54*  CALCIUM 8.3* 7.9* 7.7*  PHOS  --   --  2.2*   CBC Recent Labs  Lab 06/09/22 2000 06/10/22 0353 06/11/22 0345 06/11/22 2019 06/12/22 0327  WBC 16.4* 16.9* 12.6* 12.8* 13.9*  NEUTROABS 13.0* 14.0* 10.2*  --  11.2*  HGB 9.2* 8.3* 8.0* 8.4* 8.1*  HCT 27.8* 25.3* 24.2* 24.8* 24.1*  MCV 95.2 95.8 95.3 94.3 93.4  PLT 150 134* 125* 120* 132*      Medications:     aspirin  650 mg Oral Daily   Chlorhexidine Gluconate Cloth  6 each Topical Q0600   dextromethorphan-guaiFENesin  1 tablet Oral BID   heparin  5,000 Units Subcutaneous Q8H   insulin aspart  0-15 Units Subcutaneous TID WC   insulin aspart  0-5 Units Subcutaneous QHS   labetalol  200 mg Oral BID   pantoprazole  40 mg Oral Daily    sevelamer carbonate  800 mg Oral BID AC   vancomycin variable dose per unstable renal function (pharmacist dosing)   Does not apply See admin instructions     Madelon Lips, MD 06/12/2022, 5:48 PM

## 2022-06-12 NOTE — Progress Notes (Signed)
Report was given to carelink.

## 2022-06-12 NOTE — Progress Notes (Signed)
PROGRESS NOTE     Shawny Borkowski, is a 81 y.o. female, DOB - [birth year], LTJ:[id number]  Admit date - 06/09/2022   Admitting Physician Rolla Plate, DO  Outpatient Primary MD for the patient is [name], MD  LOS - 3  Chief Complaint  Patient presents with   Shortness of Breath        Brief Narrative:  81 y.o. female with medical history significant of ESRD, diabetes mellitus type 2, GERD, hypertension, history of stroke admitted on 06/07/2022 with left-sided pleural effusion and sided pneumonia and dyspnea   -Assessment and Plan: 1) left-sided CAP with left-sided pleural effusion--??  Parapneumonic effusion -Status post left-sided thoracentesis on 06/10/2022 with removal of 1.2 L of pleural fluid -Fluid cell count elevated at 1335 cells, Gram stain and culture NGTD, postthoracentesis chest x-ray shows persistent left-upper lobe pneumonia and effusion -Patient was recently treated with Unasyn and azithromycin for CAP -Procalcitonin elevated -Continue vancomycin and cefepime for possible HCAP -Repeat two-view chest x-ray on 06/12/2022 shows persistent left sided effusion --06/12/22 -- radiologist attempted left-sided thoracentesis under ultrasound guidance but got only 100 mL of serosanguinous to bloody colored fluid - effusion loculated, septated, echogenic, likely clot -Pulmonologist Dr. Melvyn Novas recommends transfer to [facility]- will likely need pigtail and lysis and both need to do at [facility] - ok to transfer on [facility] service and consult pulmonary -  -Please consult pulmonology when patient gets to [facility]  2)ESRD concerns about volume overload on admission -Respiratory status improving after back-to-back hemodialysis sessions on 06/10/2022 on 06/11/2022 Nephrology consult appreciated -Monday Wednesday Friday HD schedule -Please consult nephrology when patient [facility] - 3)Pleural effusion - Secondary to pneumonia with parapneumonic effusion Versus  fluid overload in the setting of ESRD  - -Patient does not make any urine and is unlikely to benefit from Lasix -Repeat chest x-ray as above #1 --Please see #1 above -Antibiotics as above #1  4)Chest pain - Chest x-ray shows pulmonary edema - EKG shows sinus tachycardia with baseline wander - Pain is pleuritic in nature -Troponin is 36 similar to prior -No further chest pains  5)GERD (gastroesophageal reflux disease) - Continue Protonix  6)DMII (diabetes mellitus, type 2) (HCC) - Recent A1c 8.8 reflecting uncontrolled DM with hyperglycemia PTA Use Novolog/Humalog Sliding scale insulin with Accu-Cheks/Fingersticks as ordered   7)Hypertensive crisis - Blood pressure as high as 214/94 - Likely related to fluid overload in the setting of known hypertension - IV hydralazine improve the blood pressure to 165/72 - Continue patient's home labetalol -BP improved after back-to-back hemodialysis sessions  8) chronic anemia of ESRD--- stable,  defer Procrit/ESA agent to nephrology team  Status is: Inpatient  Disposition: The patient is from: Home              Anticipated d/c is to: Home              Anticipated d/c date is: > 3 days              Patient currently is not medically stable to d/c. Barriers: Not Clinically Stable-   Code Status :  -  Code Status: Full Code   Family Communication:   Discussed with daughter Pricilla at bedside  DVT Prophylaxis  :   - SCDs  heparin injection 5,000 Units Start: 06/09/22 2230 SCDs Start: 06/09/22 2218   Lab Results  Component Value Date   PLT 132 (L) 06/12/2022    Inpatient Medications  Scheduled Meds:  aspirin  650  mg Oral Daily   Chlorhexidine Gluconate Cloth  6 each Topical Q0600   dextromethorphan-guaiFENesin  1 tablet Oral BID   heparin  5,000 Units Subcutaneous Q8H   insulin aspart  0-15 Units Subcutaneous TID WC   insulin aspart  0-5 Units Subcutaneous QHS   labetalol  200 mg Oral BID   pantoprazole  40 mg Oral Daily    sevelamer carbonate  800 mg Oral BID AC   vancomycin variable dose per unstable renal function (pharmacist dosing)   Does not apply See admin instructions   Continuous Infusions:  ceFEPime (MAXIPIME) IV 2 g (06/11/22 1720)   vancomycin     PRN Meds:.acetaminophen **OR** acetaminophen, albuterol, hydrOXYzine, lidocaine (PF), lidocaine-prilocaine, morphine injection, ondansetron **OR** ondansetron (ZOFRAN) IV, oxyCODONE, pentafluoroprop-tetrafluoroeth   Anti-infectives (From admission, onward)    Start     Dose/Rate Route Frequency Ordered Stop   06/12/22 1230  vancomycin (VANCOREADY) IVPB 750 mg/150 mL        750 mg 150 mL/hr over 60 Minutes Intravenous  Once 06/12/22 1142 06/12/22 1351   06/11/22 2200  vancomycin (VANCOREADY) IVPB 750 mg/150 mL        750 mg 150 mL/hr over 60 Minutes Intravenous Every M-W-F (Hemodialysis) 06/11/22 1559     06/11/22 1600  ceFEPIme (MAXIPIME) 2 g in sodium chloride 0.9 % 100 mL IVPB        2 g 200 mL/hr over 30 Minutes Intravenous Every M-W-F (Hemodialysis) 06/11/22 1312     06/10/22 2300  ceFEPIme (MAXIPIME) 1 g in sodium chloride 0.9 % 100 mL IVPB  Status:  Discontinued        1 g 200 mL/hr over 30 Minutes Intravenous Every 24 hours 06/09/22 2219 06/11/22 1312   06/10/22 1800  vancomycin (VANCOREADY) IVPB 750 mg/150 mL        750 mg 150 mL/hr over 60 Minutes Intravenous Every T-Th-Sa (Hemodialysis) 06/10/22 1302 06/10/22 2030   06/09/22 2230  ceFEPIme (MAXIPIME) 2 g in sodium chloride 0.9 % 100 mL IVPB        2 g 200 mL/hr over 30 Minutes Intravenous  Once 06/09/22 2219 06/10/22 0005   06/09/22 2230  vancomycin (VANCOREADY) IVPB 1750 mg/350 mL        1,750 mg 175 mL/hr over 120 Minutes Intravenous  Once 06/09/22 2219 06/10/22 0327   06/09/22 2219  vancomycin variable dose per unstable renal function (pharmacist dosing)         Does not apply See admin instructions 06/09/22 2219           Subjective: Eudora Guevarra today has no fevers, no  emesis,  No further chest pain, - Dyspnea on exertion persist, patient daughter is at bedside - -06/12/22 -- radiologist attempted left-sided thoracentesis under ultrasound guidance but got only 100 mL of serosanguinous to bloody colored fluid - effusion loculated, septated, echogenic, likely clot -Pulmonologist Dr. Melvyn Novas recommends transfer to [facility]- will likely need pigtail and lysis and both need to do at [facility] - ok to transfer on [facility] service and consult pulmonary -  -Please consult pulmonology when patient gets to [facility]  Objective: Vitals:   06/12/22 1239 06/12/22 1247 06/12/22 1248 06/12/22 1417  BP:    (!) 107/52  Pulse: 75 78 79 78  Resp:    18  Temp:    98.6 F (37 C)  TempSrc:    Oral  SpO2: 100% 100% 98% 100%  Weight:      Height:  Intake/Output Summary (Last 24 hours) at 06/12/2022 1839 Last data filed at 06/12/2022 1624 Gross per 24 hour  Intake 158.46 ml  Output --  Net 158.46 ml   Filed Weights   06/10/22 2100 06/11/22 1110 06/11/22 1411  Weight: 74.5 kg 73 kg 71 kg    Physical Exam  Gen:- Awake Alert, no acute distress HEENT:- [place].AT, No sclera icterus Ears-HOH Neck-Supple Neck,No JVD,.  Lungs-diminished breath sounds on the left, no wheezing  CV- S1, S2 normal, regular  Abd-  +ve B.Sounds, Abd Soft, No tenderness,    Extremity/Skin:- +ve  edema, pedal pulses present  Psych-affect is appropriate, oriented x3 Neuro-no new focal deficits, no tremors MSK-right arm AV fistula  Data Reviewed: I have personally reviewed following labs and imaging studies  CBC: Recent Labs  Lab 06/09/22 2000 06/10/22 0353 06/11/22 0345 06/11/22 2019 06/12/22 0327  WBC 16.4* 16.9* 12.6* 12.8* 13.9*  NEUTROABS 13.0* 14.0* 10.2*  --  11.2*  HGB 9.2* 8.3* 8.0* 8.4* 8.1*  HCT 27.8* 25.3* 24.2* 24.8* 24.1*  MCV 95.2 95.8 95.3 94.3 93.4  PLT 150 134* 125* 120* 865*   Basic Metabolic Panel: Recent Labs  Lab 06/09/22 2000 06/10/22 0353 06/11/22 0345  06/12/22 0327  NA 139 139 137  --   K 3.5 3.9 3.7  --   CL 99 101 97*  --   CO2 '28 25 28  '$ --   GLUCOSE 187* 184* 210*  --   BUN 28* 31* 17  --   CREATININE 7.43* 7.73* 4.54*  --   CALCIUM 8.3* 7.9* 7.7*  --   MG  --  2.1 1.7 1.7  PHOS  --   --  2.2*  --    GFR: Estimated Creatinine Clearance: 9.3 mL/min (A) (by C-G formula based on SCr of 4.54 mg/dL (H)). Liver Function Tests: Recent Labs  Lab 06/09/22 2000 06/10/22 0353 06/11/22 0345  AST 20 17  --   ALT 13 13  --   ALKPHOS 68 58  --   BILITOT 0.5 0.7  --   PROT 7.3 6.4*  --   ALBUMIN 3.1* 2.8* 2.5*   Recent Results (from the past 240 hour(s))  Expectorated Sputum Assessment w Gram Stain, Rflx to Resp Cult     Status: None   Collection Time: 06/09/22 12:04 AM   Specimen: Expectorated Sputum  Result Value Ref Range Status   Specimen Description EXPECTORATED SPUTUM  Final   Special Requests NONE  Final   Sputum evaluation   Final    Sputum specimen not acceptable for testing.  Please recollect.   CALL HARDIN,S. ON 06/10/22 '@0228'$  BY [facility] Performed at [facility], [address] [place],  [address]    Report Status 06/10/2022 FINAL  Final  Resp panel by RT-PCR (RSV, Flu A&B, Covid) Anterior Nasal Swab     Status: None   Collection Time: 06/09/22 10:17 PM   Specimen: Anterior Nasal Swab  Result Value Ref Range Status   SARS Coronavirus 2 by RT PCR NEGATIVE NEGATIVE Final    Comment: (NOTE) SARS-CoV-2 target nucleic acids are NOT DETECTED.  The SARS-CoV-2 RNA is generally detectable in upper respiratory specimens during the acute phase of infection. The lowest concentration of SARS-CoV-2 viral copies this assay can detect is 138 copies/mL. A negative result does not preclude SARS-Cov-2 infection and should not be used as the sole basis for treatment or other patient management decisions. A negative result may occur with  improper specimen collection/handling, submission of  specimen other than  nasopharyngeal swab, presence of viral mutation(s) within the areas targeted by this assay, and inadequate number of viral copies(<138 copies/mL). A negative result must be combined with clinical observations, patient history, and epidemiological information. The expected result is Negative.  Fact Sheet for Patients:  EntrepreneurPulse.com.au  Fact Sheet for Healthcare Providers:  IncredibleEmployment.be  This test is no t yet approved or cleared by the [place] FDA and  has been authorized for detection and/or diagnosis of SARS-CoV-2 by FDA under an Emergency Use Authorization (EUA). This EUA will remain  in effect (meaning this test can be used) for the duration of the COVID-19 declaration under Section 564(b)(1) of the Act, 21 U.S.C.section 360bbb-3(b)(1), unless the authorization is terminated  or revoked sooner.       Influenza A by PCR NEGATIVE NEGATIVE Final   Influenza B by PCR NEGATIVE NEGATIVE Final    Comment: (NOTE) The Xpert Xpress SARS-CoV-2/FLU/RSV plus assay is intended as an aid in the diagnosis of influenza from Nasopharyngeal swab specimens and should not be used as a sole basis for treatment. Nasal washings and aspirates are unacceptable for Xpert Xpress SARS-CoV-2/FLU/RSV testing.  Fact Sheet for Patients: EntrepreneurPulse.com.au  Fact Sheet for Healthcare Providers: IncredibleEmployment.be  This test is not yet approved or cleared by the [place] FDA and has been authorized for detection and/or diagnosis of SARS-CoV-2 by FDA under an Emergency Use Authorization (EUA). This EUA will remain in effect (meaning this test can be used) for the duration of the COVID-19 declaration under Section 564(b)(1) of the Act, 21 U.S.C. section 360bbb-3(b)(1), unless the authorization is terminated or revoked.     Resp Syncytial Virus by PCR NEGATIVE NEGATIVE Final    Comment:  (NOTE) Fact Sheet for Patients: EntrepreneurPulse.com.au  Fact Sheet for Healthcare Providers: IncredibleEmployment.be  This test is not yet approved or cleared by the [place] FDA and has been authorized for detection and/or diagnosis of SARS-CoV-2 by FDA under an Emergency Use Authorization (EUA). This EUA will remain in effect (meaning this test can be used) for the duration of the COVID-19 declaration under Section 564(b)(1) of the Act, 21 U.S.C. section 360bbb-3(b)(1), unless the authorization is terminated or revoked.  Performed at [facility], [address], [place], [place] [address]   Culture, blood (routine x 2) Call MD if unable to obtain prior to antibiotics being given     Status: None (Preliminary result)   Collection Time: 06/09/22 10:48 PM   Specimen: BLOOD LEFT ARM  Result Value Ref Range Status   Specimen Description BLOOD LEFT ARM BOTTLES DRAWN AEROBIC AND ANAEROBIC  Final   Special Requests Blood Culture adequate volume  Final   Culture   Final    NO GROWTH 3 DAYS Performed at [facility], [address], [place], [place] [address]    Report Status PENDING  Incomplete  Culture, blood (routine x 2) Call MD if unable to obtain prior to antibiotics being given     Status: None (Preliminary result)   Collection Time: 06/09/22 10:49 PM   Specimen: Site Not Specified; Blood  Result Value Ref Range Status   Specimen Description   Final    SITE NOT [place] AND ANAEROBIC   Special Requests Blood Culture adequate volume  Final   Culture   Final    NO GROWTH 3 DAYS Performed at [facility], [address], [place], [place] [address]    Report Status PENDING  Incomplete  Culture, body fluid  w Gram Stain-bottle     Status: None (Preliminary result)   Collection Time: 06/10/22 11:35 AM   Specimen: Pleura  Result Value Ref Range Status   Specimen Description PLEURAL  Final   Special Requests BOTTLES  DRAWN AEROBIC AND ANAEROBIC 10CC  Final   Culture   Final    NO GROWTH 2 DAYS Performed at [facility], [address], [place], [place] [address]    Report Status PENDING  Incomplete  Gram stain     Status: None   Collection Time: 06/10/22 11:35 AM   Specimen: Pleura  Result Value Ref Range Status   Specimen Description PLEURAL  Final   Special Requests BOTTLES DRAWN AEROBIC AND ANAEROBIC 10 CC  Final   Gram Stain   Final    NO ORGANISMS SEEN WBC PRESENT,BOTH PMN AND MONONUCLEAR CYTOSPIN SMEAR Performed at [facility], [address], [place], [place] [address]    Report Status 06/10/2022 FINAL  Final  MRSA Next Gen by PCR, Nasal     Status: None   Collection Time: 06/10/22  5:00 PM   Specimen: Nasal Mucosa; Nasal Swab  Result Value Ref Range Status   MRSA by PCR Next Gen NOT DETECTED NOT DETECTED Final    Comment: (NOTE) The GeneXpert MRSA Assay (FDA approved for NASAL specimens only), is one component of a comprehensive MRSA colonization surveillance program. It is not intended to diagnose MRSA infection nor to guide or monitor treatment for MRSA infections. Test performance is not FDA approved in patients less than 20 years old. Performed at [facility], [address], [place], [place] [address]       Radiology Studies: US THORACENTESIS ASP PLEURAL SPACE W/IMG GUIDE  Result Date: 06/12/2022 INDICATION: Recurrent LEFT pleural effusion EXAM: ULTRASOUND GUIDED THERAPEUTIC LEFT THORACENTESIS MEDICATIONS: None. COMPLICATIONS: None immediate. PROCEDURE: An ultrasound guided thoracentesis was thoroughly discussed with the patient and questions answered. The benefits, risks, alternatives and complications were also discussed. The patient understands and wishes to proceed with the procedure. Written consent was obtained. Ultrasound was performed to localize and mark an adequate pocket of fluid in the LEFT chest. The area was then prepped and draped in the normal sterile fashion. 1%  Lidocaine was used for local anesthesia. Under ultrasound guidance a 6 Fr Safe-T-Centesis catheter was introduced. No fluid could be aspirated. Under direct sonographic guidance, a 5 [place] Yueh catheter was placed into the effusion. Only about 100 cc of serosanguineous to bloody fluid could be aspirated. Further ultrasound interrogation demonstrates that the fluid is complicated, containing diffuse internal echogenicity question clot, as well as internal septations, consistent with hemothorax new since previous thoracentesis. Procedure was then terminated. The catheter was removed and a dressing applied. FINDINGS: A only 100 mL of LEFT pleural fluid was removed. Observed recurrent LEFT pleural effusion is complicated containing septations and scattered low level internal echogenicity question clot. IMPRESSION: Ultrasound guided therapeutic LEFT thoracentesis yielding only 100 mL of pleural fluid as above. Electronically Signed   By: Lavonia Dana M.D.   On: 06/12/2022 11:48   DG Chest 1 View  Result Date: 06/12/2022 CLINICAL DATA:  Post LEFT thoracentesis EXAM: CHEST  1 VIEW COMPARISON:  Exam at 1115 hours compared to 06/12/2022 at 0851 hours FINDINGS: Enlargement of cardiac silhouette with stable loop recorder. Pulmonary vascular congestion. Elevation of LEFT diaphragm. Persistent LEFT pleural effusion and basilar atelectasis. No pneumothorax following thoracentesis. Osseous structures unremarkable. IMPRESSION: No pneumothorax following thoracentesis. Electronically Signed   By: Crist Infante.D.  On: 06/12/2022 11:43   DG Chest 2 View  Result Date: 06/12/2022 CLINICAL DATA:  Dyspnea. EXAM: CHEST - 2 VIEW COMPARISON:  June 12, 2022 FINDINGS: Image rotated to LEFT. Accounting for this no change in cardiac enlargement and fullness of hilar structures. Slight enlargement of moderately large LEFT-sided pleural effusion since previous imaging, associated with LEFT lower lobe consolidative changes. Pleural  fluid in the LEFT chest is also likely partially loculated in the major fissure based on lateral projection. Mild increased interstitial markings. On limited assessment no acute skeletal process. Cardiac loop recorder projects over the LEFT chest. IMPRESSION: 1. Slight enlargement of moderately large LEFT-sided pleural effusion since previous imaging, associated with LEFT lower lobe consolidative changes. Pleural fluid not tracks into the major fissure in the LEFT chest. 2. LEFT upper lobe process better demonstrated on recent CT and as compared to recent chest radiograph displays little change. 3. Constellation of findings while potentially related to pneumonia should be followed to ensure clearing and exclude the possibility of underlying neoplasm. Electronically Signed   By: Zetta Bills M.D.   On: 06/12/2022 09:11     Scheduled Meds:  aspirin  650 mg Oral Daily   Chlorhexidine Gluconate Cloth  6 each Topical Q0600   dextromethorphan-guaiFENesin  1 tablet Oral BID   heparin  5,000 Units Subcutaneous Q8H   insulin aspart  0-15 Units Subcutaneous TID WC   insulin aspart  0-5 Units Subcutaneous QHS   labetalol  200 mg Oral BID   pantoprazole  40 mg Oral Daily   sevelamer carbonate  800 mg Oral BID AC   vancomycin variable dose per unstable renal function (pharmacist dosing)   Does not apply See admin instructions   Continuous Infusions:  ceFEPime (MAXIPIME) IV 2 g (06/11/22 1720)   vancomycin       LOS: 3 days    Roxan Hockey M.D on 06/12/2022 at 6:39 PM  Go to [email or web address] - for contact info  [facility] - Office  [phone]  If 7PM-7AM, please contact night-coverage [email or web address] 06/12/2022, 6:39 PM

## 2022-06-13 ENCOUNTER — Inpatient Hospital Stay (HOSPITAL_COMMUNITY): Payer: 59

## 2022-06-13 ENCOUNTER — Encounter (HOSPITAL_COMMUNITY)
Admission: EM | Disposition: A | Discharge status: Skilled Nursing Facility | Payer: Self-pay | Source: Home / Self Care | Attending: Internal Medicine

## 2022-06-13 DIAGNOSIS — D696 Thrombocytopenia, unspecified: Secondary | ICD-10-CM

## 2022-06-13 DIAGNOSIS — N186 End stage renal disease: Secondary | ICD-10-CM

## 2022-06-13 DIAGNOSIS — I10 Essential (primary) hypertension: Secondary | ICD-10-CM

## 2022-06-13 DIAGNOSIS — J189 Pneumonia, unspecified organism: Secondary | ICD-10-CM | POA: Diagnosis not present

## 2022-06-13 DIAGNOSIS — J90 Pleural effusion, not elsewhere classified: Secondary | ICD-10-CM

## 2022-06-13 DIAGNOSIS — E1129 Type 2 diabetes mellitus with other diabetic kidney complication: Secondary | ICD-10-CM | POA: Diagnosis not present

## 2022-06-13 DIAGNOSIS — Z992 Dependence on renal dialysis: Secondary | ICD-10-CM | POA: Diagnosis not present

## 2022-06-13 LAB — CBC WITH DIFFERENTIAL/PLATELET
Abs Immature Granulocytes: 0.1 10*3/uL — ABNORMAL HIGH (ref 0.00–0.07)
Basophils Absolute: 0 10*3/uL (ref 0.0–0.1)
Basophils Relative: 0 %
Eosinophils Absolute: 0.1 10*3/uL (ref 0.0–0.5)
Eosinophils Relative: 1 %
HCT: 24.9 % — ABNORMAL LOW (ref 36.0–46.0)
Hemoglobin: 8.3 g/dL — ABNORMAL LOW (ref 12.0–15.0)
Immature Granulocytes: 1 %
Lymphocytes Relative: 9 %
Lymphs Abs: 1.1 10*3/uL (ref 0.7–4.0)
MCH: 32.2 pg (ref 26.0–34.0)
MCHC: 33.3 g/dL (ref 30.0–36.0)
MCV: 96.5 fL (ref 80.0–100.0)
Monocytes Absolute: 1.7 10*3/uL — ABNORMAL HIGH (ref 0.1–1.0)
Monocytes Relative: 14 %
Neutro Abs: 9 10*3/uL — ABNORMAL HIGH (ref 1.7–7.7)
Neutrophils Relative %: 75 %
Platelets: 132 10*3/uL — ABNORMAL LOW (ref 150–400)
RBC: 2.58 MIL/uL — ABNORMAL LOW (ref 3.87–5.11)
RDW: 17.1 % — ABNORMAL HIGH (ref 11.5–15.5)
WBC: 12.1 10*3/uL — ABNORMAL HIGH (ref 4.0–10.5)
nRBC: 0.2 % (ref 0.0–0.2)

## 2022-06-13 LAB — GLUCOSE, CAPILLARY
Glucose-Capillary: 134 mg/dL — ABNORMAL HIGH (ref 70–99)
Glucose-Capillary: 135 mg/dL — ABNORMAL HIGH (ref 70–99)
Glucose-Capillary: 153 mg/dL — ABNORMAL HIGH (ref 70–99)
Glucose-Capillary: 164 mg/dL — ABNORMAL HIGH (ref 70–99)
Glucose-Capillary: 172 mg/dL — ABNORMAL HIGH (ref 70–99)

## 2022-06-13 LAB — MAGNESIUM: Magnesium: 1.6 mg/dL — ABNORMAL LOW (ref 1.7–2.4)

## 2022-06-13 SURGERY — CHEST TUBE INSERTION
Anesthesia: LOCAL | Laterality: Left

## 2022-06-13 MED ORDER — HYDROCERIN EX CREA
TOPICAL_CREAM | Freq: Two times a day (BID) | CUTANEOUS | Status: DC
  Administered 2022-06-20: 1 via TOPICAL
  Filled 2022-06-13: qty 113

## 2022-06-13 NOTE — Progress Notes (Signed)
PROGRESS NOTE    [name]  QAE:[id number] DOB: 07/09/1940 DOA: 06/09/2022 PCP: [name], MD   Brief Narrative: No notes on file   Assessment and Plan: * Pleural effusion Concern for parapneumonic effusion. IR consulted and performed thoracentesis, yielding minimal serosanguinous fluid with likely clot. Patient transferred to [facility] for pulmonology consult and consideration of chest tube. Concern for hemothorax. -Pulmonology recommendations: CT surgery for consideration of VATS  Hypomagnesemia Magnesium of 1.6 this morning. -Repeat magnesium in AM  CAP (community acquired pneumonia) Patient recently treated with Unasyn and azithromycin, prior to admission. On admission, patient was broadened to Vancomycin and Cefepime. Procalcitonin mildly elevated at 0.96. Afebrile with leukocytosis. Complicated by left pleural effusion. MRSA PCR negative. -Continue Vancomycin and Cefepime  GERD (gastroesophageal reflux disease) -Continue Protonix  DMII (diabetes mellitus, type 2) (HCC) -Continue SSI  ESRD (end stage renal disease) [facility]) Nephrology consulted for hemodialysis while inpatient.  Thrombocytopenia (HCC) Mild and likely reactive, secondary to infection. Stable.  Essential hypertension -Continue labetalol  Chest pain-resolved as of 06/13/2022 Likely secondary to pulmonary edema. Resolved.  Severe uncontrolled hypertension-resolved as of 06/13/2022 Patient with blood pressure up to 214/94, treated with Hydralazine IV and hemodialysis. Resolved.    DVT prophylaxis: Heparin subq Code Status:   Code Status: Full Code Family Communication: None at bedside Disposition Plan: Discharge home pending continued specialist recommendations   Consultants:  Pulmonology Cardiothoracic surgery  Procedures:  12/28: Left thoracentesis  Antimicrobials: Vancomycin Cefepime    Subjective: Patient reports no dyspnea at rest. No chest  pain.  Objective: BP (!) 161/67 (BP Location: Left Arm)   Pulse 87   Temp 98.8 F (37.1 C) (Oral)   Resp 18   Ht '5\' 7"'$  (1.702 m)   Wt 73.5 kg   SpO2 99%   BMI 25.38 kg/m   Examination:  General exam: Appears calm and comfortable. No distress. Respiratory system: Clear to auscultation on right with diminished breath sounds on left.Marland Kitchen Respiratory effort normal. Cardiovascular system: S1 & S2 heard, RRR. No murmurs, rubs, gallops or clicks. Gastrointestinal system: Abdomen is nondistended, soft and nontender. Normal bowel sounds heard. Central nervous system: Alert and oriented. No focal neurological deficits. Musculoskeletal: No calf tenderness Skin: Dry Psychiatry: Judgement and insight appear normal. Mood & affect appropriate.    Data Reviewed: I have personally reviewed following labs and imaging studies  CBC Lab Results  Component Value Date   WBC 12.1 (H) 06/13/2022   RBC 2.58 (L) 06/13/2022   HGB 8.3 (L) 06/13/2022   HCT 24.9 (L) 06/13/2022   MCV 96.5 06/13/2022   MCH 32.2 06/13/2022   PLT 132 (L) 06/13/2022   MCHC 33.3 06/13/2022   RDW 17.1 (H) 06/13/2022   LYMPHSABS 1.1 06/13/2022   MONOABS 1.7 (H) 06/13/2022   EOSABS 0.1 06/13/2022   BASOSABS 0.0 04/05/1172     Last metabolic panel Lab Results  Component Value Date   NA 137 06/11/2022   K 3.7 06/11/2022   CL 97 (L) 06/11/2022   CO2 28 06/11/2022   BUN 17 06/11/2022   CREATININE 4.54 (H) 06/11/2022   GLUCOSE 210 (H) 06/11/2022   GFRNONAA 9 (L) 06/11/2022   GFRAA 5 (L) 01/16/2020   CALCIUM 7.7 (L) 06/11/2022   PHOS 2.2 (L) 06/11/2022   PROT 6.4 (L) 06/10/2022   ALBUMIN 2.5 (L) 06/11/2022   BILITOT 0.7 06/10/2022   ALKPHOS 58 06/10/2022   AST 17 06/10/2022   ALT 13 06/10/2022   ANIONGAP 12 06/11/2022  GFR: Estimated Creatinine Clearance: 9.3 mL/min (A) (by C-G formula based on SCr of 4.54 mg/dL (H)).  Recent Results (from the past 240 hour(s))  Expectorated Sputum Assessment w Gram Stain,  Rflx to Resp Cult     Status: None   Collection Time: 06/09/22 12:04 AM   Specimen: Expectorated Sputum  Result Value Ref Range Status   Specimen Description EXPECTORATED SPUTUM  Final   Special Requests NONE  Final   Sputum evaluation   Final    Sputum specimen not acceptable for testing.  Please recollect.   CALL HARDIN,S. ON 06/10/22 '@0228'$  BY [facility] Performed at [facility], [address], [place], [place] [address]    Report Status 06/10/2022 FINAL  Final  Resp panel by RT-PCR (RSV, Flu A&B, Covid) Anterior Nasal Swab     Status: None   Collection Time: 06/09/22 10:17 PM   Specimen: Anterior Nasal Swab  Result Value Ref Range Status   SARS Coronavirus 2 by RT PCR NEGATIVE NEGATIVE Final    Comment: (NOTE) SARS-CoV-2 target nucleic acids are NOT DETECTED.  The SARS-CoV-2 RNA is generally detectable in upper respiratory specimens during the acute phase of infection. The lowest concentration of SARS-CoV-2 viral copies this assay can detect is 138 copies/mL. A negative result does not preclude SARS-Cov-2 infection and should not be used as the sole basis for treatment or other patient management decisions. A negative result may occur with  improper specimen collection/handling, submission of specimen other than nasopharyngeal swab, presence of viral mutation(s) within the areas targeted by this assay, and inadequate number of viral copies(<138 copies/mL). A negative result must be combined with clinical observations, patient history, and epidemiological information. The expected result is Negative.  Fact Sheet for Patients:  EntrepreneurPulse.com.au  Fact Sheet for Healthcare Providers:  IncredibleEmployment.be  This test is no t yet approved or cleared by the [place] FDA and  has been authorized for detection and/or diagnosis of SARS-CoV-2 by FDA under an Emergency Use Authorization (EUA). This EUA will remain  in effect (meaning  this test can be used) for the duration of the COVID-19 declaration under Section 564(b)(1) of the Act, 21 U.S.C.section 360bbb-3(b)(1), unless the authorization is terminated  or revoked sooner.       Influenza A by PCR NEGATIVE NEGATIVE Final   Influenza B by PCR NEGATIVE NEGATIVE Final    Comment: (NOTE) The Xpert Xpress SARS-CoV-2/FLU/RSV plus assay is intended as an aid in the diagnosis of influenza from Nasopharyngeal swab specimens and should not be used as a sole basis for treatment. Nasal washings and aspirates are unacceptable for Xpert Xpress SARS-CoV-2/FLU/RSV testing.  Fact Sheet for Patients: EntrepreneurPulse.com.au  Fact Sheet for Healthcare Providers: IncredibleEmployment.be  This test is not yet approved or cleared by the [place] FDA and has been authorized for detection and/or diagnosis of SARS-CoV-2 by FDA under an Emergency Use Authorization (EUA). This EUA will remain in effect (meaning this test can be used) for the duration of the COVID-19 declaration under Section 564(b)(1) of the Act, 21 U.S.C. section 360bbb-3(b)(1), unless the authorization is terminated or revoked.     Resp Syncytial Virus by PCR NEGATIVE NEGATIVE Final    Comment: (NOTE) Fact Sheet for Patients: EntrepreneurPulse.com.au  Fact Sheet for Healthcare Providers: IncredibleEmployment.be  This test is not yet approved or cleared by the [place] FDA and has been authorized for detection and/or diagnosis of SARS-CoV-2 by FDA under an Emergency Use Authorization (EUA). This EUA will remain in effect (meaning this  test can be used) for the duration of the COVID-19 declaration under Section 564(b)(1) of the Act, 21 U.S.C. section 360bbb-3(b)(1), unless the authorization is terminated or revoked.  Performed at [facility], [address], [place], [place] [address]   Culture, blood (routine x 2) Call MD  if unable to obtain prior to antibiotics being given     Status: None (Preliminary result)   Collection Time: 06/09/22 10:48 PM   Specimen: BLOOD LEFT ARM  Result Value Ref Range Status   Specimen Description BLOOD LEFT ARM BOTTLES DRAWN AEROBIC AND ANAEROBIC  Final   Special Requests Blood Culture adequate volume  Final   Culture   Final    NO GROWTH 3 DAYS Performed at [facility], [address], [place], [place] [address]    Report Status PENDING  Incomplete  Culture, blood (routine x 2) Call MD if unable to obtain prior to antibiotics being given     Status: None (Preliminary result)   Collection Time: 06/09/22 10:49 PM   Specimen: Site Not Specified; Blood  Result Value Ref Range Status   Specimen Description   Final    SITE NOT SPECIFIED BOTTLES DRAWN AEROBIC AND ANAEROBIC   Special Requests Blood Culture adequate volume  Final   Culture   Final    NO GROWTH 3 DAYS Performed at [facility], [address], [place], [place] [address]    Report Status PENDING  Incomplete  Culture, body fluid w Gram Stain-bottle     Status: None (Preliminary result)   Collection Time: 06/10/22 11:35 AM   Specimen: Pleura  Result Value Ref Range Status   Specimen Description PLEURAL  Final   Special Requests BOTTLES DRAWN AEROBIC AND ANAEROBIC 10CC  Final   Culture   Final    NO GROWTH 2 DAYS Performed at [facility], [address] [place], [place] [address]    Report Status PENDING  Incomplete  Gram stain     Status: None   Collection Time: 06/10/22 11:35 AM   Specimen: Pleura  Result Value Ref Range Status   Specimen Description PLEURAL  Final   Special Requests BOTTLES DRAWN AEROBIC AND ANAEROBIC 10 CC  Final   Gram Stain   Final    NO ORGANISMS SEEN WBC PRESENT,BOTH PMN AND MONONUCLEAR CYTOSPIN SMEAR Performed at [facility], [address], [place], [place] [address]    Report Status 06/10/2022 FINAL  Final  MRSA Next Gen by PCR, Nasal     Status: None   Collection Time:  06/10/22  5:00 PM   Specimen: Nasal Mucosa; Nasal Swab  Result Value Ref Range Status   MRSA by PCR Next Gen NOT DETECTED NOT DETECTED Final    Comment: (NOTE) The GeneXpert MRSA Assay (FDA approved for NASAL specimens only), is one component of a comprehensive MRSA colonization surveillance program. It is not intended to diagnose MRSA infection nor to guide or monitor treatment for MRSA infections. Test performance is not FDA approved in patients less than 25 years old. Performed at [facility], [address], [place], [place] [address]       Radiology Studies: US THORACENTESIS ASP PLEURAL SPACE W/IMG GUIDE  Result Date: 06/12/2022 INDICATION: Recurrent LEFT pleural effusion EXAM: ULTRASOUND GUIDED THERAPEUTIC LEFT THORACENTESIS MEDICATIONS: None. COMPLICATIONS: None immediate. PROCEDURE: An ultrasound guided thoracentesis was thoroughly discussed with the patient and questions answered. The benefits, risks, alternatives and complications were also discussed. The patient understands and wishes to proceed with the procedure. Written consent was  obtained. Ultrasound was performed to localize and mark an adequate pocket of fluid in the LEFT chest. The area was then prepped and draped in the normal sterile fashion. 1% Lidocaine was used for local anesthesia. Under ultrasound guidance a 6 Fr Safe-T-Centesis catheter was introduced. No fluid could be aspirated. Under direct sonographic guidance, a 5 [place] Yueh catheter was placed into the effusion. Only about 100 cc of serosanguineous to bloody fluid could be aspirated. Further ultrasound interrogation demonstrates that the fluid is complicated, containing diffuse internal echogenicity question clot, as well as internal septations, consistent with hemothorax new since previous thoracentesis. Procedure was then terminated. The catheter was removed and a dressing applied. FINDINGS: A only 100 mL of LEFT pleural fluid was removed. Observed recurrent  LEFT pleural effusion is complicated containing septations and scattered low level internal echogenicity question clot. IMPRESSION: Ultrasound guided therapeutic LEFT thoracentesis yielding only 100 mL of pleural fluid as above. Electronically Signed   By: Lavonia Dana M.D.   On: 06/12/2022 11:48   DG Chest 1 View  Result Date: 06/12/2022 CLINICAL DATA:  Post LEFT thoracentesis EXAM: CHEST  1 VIEW COMPARISON:  Exam at 1115 hours compared to 06/12/2022 at 0851 hours FINDINGS: Enlargement of cardiac silhouette with stable loop recorder. Pulmonary vascular congestion. Elevation of LEFT diaphragm. Persistent LEFT pleural effusion and basilar atelectasis. No pneumothorax following thoracentesis. Osseous structures unremarkable. IMPRESSION: No pneumothorax following thoracentesis. Electronically Signed   By: Lavonia Dana M.D.   On: 06/12/2022 11:43   DG Chest 2 View  Result Date: 06/12/2022 CLINICAL DATA:  Dyspnea. EXAM: CHEST - 2 VIEW COMPARISON:  June 12, 2022 FINDINGS: Image rotated to LEFT. Accounting for this no change in cardiac enlargement and fullness of hilar structures. Slight enlargement of moderately large LEFT-sided pleural effusion since previous imaging, associated with LEFT lower lobe consolidative changes. Pleural fluid in the LEFT chest is also likely partially loculated in the major fissure based on lateral projection. Mild increased interstitial markings. On limited assessment no acute skeletal process. Cardiac loop recorder projects over the LEFT chest. IMPRESSION: 1. Slight enlargement of moderately large LEFT-sided pleural effusion since previous imaging, associated with LEFT lower lobe consolidative changes. Pleural fluid not tracks into the major fissure in the LEFT chest. 2. LEFT upper lobe process better demonstrated on recent CT and as compared to recent chest radiograph displays little change. 3. Constellation of findings while potentially related to pneumonia should be followed to  ensure clearing and exclude the possibility of underlying neoplasm. Electronically Signed   By: Zetta Bills M.D.   On: 06/12/2022 09:11      LOS: 4 days    Cordelia Poche, MD [facility] 06/13/2022, 8:23 AM   If 7PM-7AM, please contact night-coverage [email or web address]

## 2022-06-13 NOTE — Plan of Care (Signed)
  Problem: Activity: Goal: Ability to tolerate increased activity will improve Outcome: Progressing   Problem: Clinical Measurements: Goal: Ability to maintain a body temperature in the normal range will improve Outcome: Progressing   Problem: Respiratory: Goal: Ability to maintain adequate ventilation will improve Outcome: Progressing Goal: Ability to maintain a clear airway will improve Outcome: Progressing   Problem: Education: Goal: Knowledge of General Education information will improve Description: Including pain rating scale, medication(s)/side effects and non-pharmacologic comfort measures Outcome: Progressing   Problem: Health Behavior/Discharge Planning: Goal: Ability to manage health-related needs will improve Outcome: Progressing   Problem: Clinical Measurements: Goal: Ability to maintain clinical measurements within normal limits will improve Outcome: Progressing Goal: Will remain free from infection Outcome: Progressing Goal: Diagnostic test results will improve Outcome: Progressing Goal: Respiratory complications will improve Outcome: Progressing Goal: Cardiovascular complication will be avoided Outcome: Progressing   Problem: Activity: Goal: Risk for activity intolerance will decrease Outcome: Progressing   Problem: Nutrition: Goal: Adequate nutrition will be maintained Outcome: Progressing   Problem: Coping: Goal: Level of anxiety will decrease Outcome: Progressing   Problem: Elimination: Goal: Will not experience complications related to bowel motility Outcome: Progressing Goal: Will not experience complications related to urinary retention Outcome: Progressing   Problem: Pain Managment: Goal: General experience of comfort will improve Outcome: Progressing   Problem: Safety: Goal: Ability to remain free from injury will improve Outcome: Progressing   Problem: Skin Integrity: Goal: Risk for impaired skin integrity will decrease Outcome:  Progressing   Problem: Education: Goal: Ability to describe self-care measures that may prevent or decrease complications (Diabetes Survival Skills Education) will improve Outcome: Progressing Goal: Individualized Educational Video(s) Outcome: Progressing   Problem: Coping: Goal: Ability to adjust to condition or change in health will improve Outcome: Progressing   Problem: Fluid Volume: Goal: Ability to maintain a balanced intake and output will improve Outcome: Progressing   Problem: Health Behavior/Discharge Planning: Goal: Ability to identify and utilize available resources and services will improve Outcome: Progressing Goal: Ability to manage health-related needs will improve Outcome: Progressing   Problem: Metabolic: Goal: Ability to maintain appropriate glucose levels will improve Outcome: Progressing   Problem: Nutritional: Goal: Maintenance of adequate nutrition will improve Outcome: Progressing Goal: Progress toward achieving an optimal weight will improve Outcome: Progressing   Problem: Skin Integrity: Goal: Risk for impaired skin integrity will decrease Outcome: Progressing   Problem: Tissue Perfusion: Goal: Adequacy of tissue perfusion will improve Outcome: Progressing   

## 2022-06-13 NOTE — Progress Notes (Signed)
Physical Therapy Treatment Patient Details Name: [name] MRN: [id number] DOB: 25-Nov-1940 Today's Date: 06/13/2022   History of Present Illness 81 y.o. female admitted on 06/07/2022 with left-sided pleural effusion, pneumonia and dyspnea. PMHx: ESRD, diabetes mellitus type 2, GERD, hypertension, history of stroke.    PT Comments    Progressing towards acute PT goals. Ambulating with min guard but lots of verbal cues for sequencing throughout bout, especially in congested areas. Pt disoriented to month and year. Needing tactile and verbal cues to facilitate desired direction with mobility. Exhausted after ambulating 75 in hallway with RW (SpO2 99% on RA,  2/4 dyspnea.) Patient will continue to benefit from skilled physical therapy services to further improve independence with functional mobility.   Recommendations for follow up therapy are one component of a multi-disciplinary discharge planning process, led by the attending physician.  Recommendations may be updated based on patient status, additional functional criteria and insurance authorization.  Follow Up Recommendations  Home health PT     Assistance Recommended at Discharge Intermittent Supervision/Assistance  Patient can return home with the following A little help with walking and/or transfers;A little help with bathing/dressing/bathroom;Help with stairs or ramp for entrance;Assistance with cooking/housework;Direct supervision/assist for medications management;Direct supervision/assist for financial management;Assist for transportation   Equipment Recommendations  None recommended by PT    Recommendations for Other Services       Precautions / Restrictions Precautions Precautions: Fall Restrictions Weight Bearing Restrictions: No     Mobility  Bed Mobility               General bed mobility comments: Sitting EOB    Transfers Overall transfer level: Needs assistance Equipment used: Rolling walker (2  wheels) Transfers: Sit to/from Stand Sit to Stand: Min guard           General transfer comment: Min guard for safety. Tremulous, cues for hand placement on RW upon standing. Performed from bed and BSC in bathroom, good control with desent.    Ambulation/Gait Ambulation/Gait assistance: Min guard Gait Distance (Feet): 75 Feet (+15) Assistive device: Rolling walker (2 wheels) Gait Pattern/deviations: Decreased step length - right, Decreased step length - left, Decreased stride length, Trunk flexed, Step-through pattern Gait velocity: decreased Gait velocity interpretation: <1.31 ft/sec, indicative of household ambulator   General Gait Details: Slow and labored gait with fair foot clearance, no evidence of LOB. Needs min guard for safety and cues for RW use with turns. Becomes very fatigued 2/4 dyspnea, Spo2 99% on RA, HR 100. Cues for symptom awareness and RW use. Some difficulty with sequencing in congested areas and following directions for turns.   Stairs             Wheelchair Mobility    Modified Rankin (Stroke Patients Only)       Balance Overall balance assessment: Needs assistance Sitting-balance support: Feet supported, No upper extremity supported Sitting balance-Leahy Scale: Good Sitting balance - Comments: seated at EOB   Standing balance support: During functional activity, No upper extremity supported Standing balance-Leahy Scale: Fair Standing balance comment: washes hands at sink, some instability present but able to self correct with hands on counter intermittently.                            Cognition Arousal/Alertness: Awake/alert Behavior During Therapy: WFL for tasks assessed/performed Overall Cognitive Status: No family/caregiver present to determine baseline cognitive functioning Area of Impairment: Orientation, Following commands, Safety/judgement, Problem solving  Orientation Level: Disoriented to, Time      Following Commands: Follows one step commands inconsistently, Follows one step commands with increased time Safety/Judgement: Decreased awareness of deficits, Decreased awareness of safety   Problem Solving: Slow processing, Difficulty sequencing, Decreased initiation, Requires verbal cues, Requires tactile cues General Comments: Unsure of month or year. Difficulty following simple directions while ambulating.        Exercises      General Comments General comments (skin integrity, edema, etc.): BP 156/62 HR 94 ; seated after ambulating, very tired. keeping eyes closed. Denies dizziness.      Pertinent Vitals/Pain Pain Assessment Pain Assessment: No/denies pain Pain Intervention(s): Monitored during session    Home Living                          Prior Function            PT Goals (current goals can now be found in the care plan section) Acute Rehab PT Goals Patient Stated Goal: return home with family to assist PT Goal Formulation: With patient/family Time For Goal Achievement: 06/25/22 Potential to Achieve Goals: Good Progress towards PT goals: Progressing toward goals    Frequency    Min 3X/week      PT Plan Current plan remains appropriate    Co-evaluation              AM-PAC PT "6 Clicks" Mobility   Outcome Measure  Help needed turning from your back to your side while in a flat bed without using bedrails?: A Little Help needed moving from lying on your back to sitting on the side of a flat bed without using bedrails?: A Little Help needed moving to and from a bed to a chair (including a wheelchair)?: A Little Help needed standing up from a chair using your arms (e.g., wheelchair or bedside chair)?: A Little Help needed to walk in hospital room?: A Little Help needed climbing 3-5 steps with a railing? : A Lot 6 Click Score: 17    End of Session Equipment Utilized During Treatment: Gait belt Activity Tolerance: Patient tolerated  treatment well Patient left: in chair;with call bell/phone within reach;with chair alarm set Nurse Communication: Mobility status PT Visit Diagnosis: Unsteadiness on feet (R26.81);Other abnormalities of gait and mobility (R26.89);Muscle weakness (generalized) (M62.81);Difficulty in walking, not elsewhere classified (R26.2)     Time: 2376-2831 PT Time Calculation (min) (ACUTE ONLY): 34 min  Charges:  $Gait Training: 8-22 mins                     Candie Mile, PT, DPT Physical Therapist Acute Rehabilitation Services [place] 06/13/2022, 10:30 AM

## 2022-06-13 NOTE — Assessment & Plan Note (Signed)
-  Continue labetalol

## 2022-06-13 NOTE — Consult Note (Signed)
NAME:  [name], MRN:  [id number], DOB:  09/19/1940, LOS: 4 ADMISSION DATE:  06/09/2022, CONSULTATION DATE: 06/13/2022 REFERRING MD: [facility], CHIEF COMPLAINT: Left pleural effusion  History of Present Illness:  81 year old female with a extensive past medical history is well-documented below is most significant for end-stage renal disease, history of stroke dyspnea left-sided pleural effusion that was 06/10/2022 100 cc but subsequent follow-up radiographic data reveals enlargement of the effusion.  There was concern that this may be a clot.  She has been transferred to [facility] for further evaluation and treatment.  Pulmonary critical care is recommended interventional radiology be consulted for any further thoracic interventions.  She is currently stable and sitting in a chair very poor historian  Pertinent  Medical History   Past Medical History:  Diagnosis Date   Anemia    Cataract    Chronic kidney disease    CVA (cerebral infarction)    Diabetes mellitus with ESRD (end-stage renal disease) ([place])    Type 2   Dialysis patient ([place])    M, W, F   Fistula    R arm   GERD (gastroesophageal reflux disease)    Hypertension    Renal disorder    Shortness of breath    Stroke [facility])    right side weakness     Significant Hospital Events: Including procedures, antibiotic start and stop dates in addition to other pertinent events     Interim History / Subjective:    Sitting in chair  Objective   Blood pressure (!) 161/67, pulse 87, temperature 98.8 F (37.1 C), temperature source Oral, resp. rate 18, height _0  (1.702 m), weight 73.5 kg, SpO2 99 %.        Intake/Output Summary (Last 24 hours) at 06/13/2022 1026 Last data filed at 06/13/2022 0600 Gross per 24 hour  Intake 158.46 ml  Output 0 ml  Net 158.46 ml   Filed Weights   06/11/22 1110 06/11/22 1411 06/13/22 0051  Weight: 73 kg 71 kg 73.5 kg    Examination: General: Obese elderly female  sitting in chair no acute distress HENT: No JVD is appreciated Lungs: Decreased breath sounds bilaterally left greater than right Cardiovascular: Heart sounds are distant Abdomen: Abdomen is obese soft positive bowel sounds Extremities: Right foot surgical intervention right arm AV graft Neuro: Poor historian knows that she has trouble remembering anything   Resolved Hospital Problem list     Assessment & Plan:  Large loculated left effusion that increased in size after having thoracentesis performed on 06/10/2022 per interventional radiology at an [facility].  There is concern that this may be a clot.  Following extensive chart review and discussion with Dr. Lake Bells plan of care will be listed below. Therefore suggest to have interventional radiology evaluate for possible chest tube versus thoracentesis versus monitoring over a period of time. No role for pulmonary to place chest tube in a patient at this time. She does not appear to be in any acute distress at the time of examination.  History of stroke and dementia Per primary End-stage renal disease Per nephrology  Diabetes mellitus type 2 CBG (last 3)  Recent Labs    06/12/22 2103 06/13/22 0054 06/13/22 0807  GLUCAP 151* 134* 172*   Per primary  Best Practice (right click and "Reselect all SmartList Selections" daily)   Per primary  Labs   CBC: Recent Labs  Lab 06/09/22 2000 06/10/22 0353 06/11/22 0345 06/11/22 2019 06/12/22 0327 06/13/22 0157  WBC 16.4* 16.9* 12.6* 12.8* 13.9* 12.1*  NEUTROABS 13.0* 14.0* 10.2*  --  11.2* 9.0*  HGB 9.2* 8.3* 8.0* 8.4* 8.1* 8.3*  HCT 27.8* 25.3* 24.2* 24.8* 24.1* 24.9*  MCV 95.2 95.8 95.3 94.3 93.4 96.5  PLT 150 134* 125* 120* 132* 132*    Basic Metabolic Panel: Recent Labs  Lab 06/09/22 2000 06/10/22 0353 06/11/22 0345 06/12/22 0327 06/13/22 0157  NA 139 139 137  --   --   K 3.5 3.9 3.7  --   --   CL 99 101 97*  --   --   CO2 _0 --   --   GLUCOSE  187* 184* 210*  --   --   BUN 28* 31* 17  --   --   CREATININE 7.43* 7.73* 4.54*  --   --   CALCIUM 8.3* 7.9* 7.7*  --   --   MG  --  2.1 1.7 1.7 1.6*  PHOS  --   --  2.2*  --   --    GFR: Estimated Creatinine Clearance: 9.3 mL/min (A) (by C-G formula based on SCr of 4.54 mg/dL (H)). Recent Labs  Lab 06/09/22 2000 06/10/22 0353 06/11/22 0345 06/11/22 2019 06/12/22 0327 06/13/22 0157  PROCALCITON 0.96  --   --   --   --   --   WBC 16.4*   < > 12.6* 12.8* 13.9* 12.1*   < > = values in this interval not displayed.    Liver Function Tests: Recent Labs  Lab 06/09/22 2000 06/10/22 0353 06/11/22 0345  AST 20 17  --   ALT 13 13  --   ALKPHOS 68 58  --   BILITOT 0.5 0.7  --   PROT 7.3 6.4*  --   ALBUMIN 3.1* 2.8* 2.5*   No results for input(s): "LIPASE", "AMYLASE" in the last 168 hours. No results for input(s): "AMMONIA" in the last 168 hours.  ABG    Component Value Date/Time   TCO2 32 04/17/2022 0806     Coagulation Profile: No results for input(s): "INR", "PROTIME" in the last 168 hours.  Cardiac Enzymes: No results for input(s): "CKTOTAL", "CKMB", "CKMBINDEX", "TROPONINI" in the last 168 hours.  HbA1C: Hemoglobin A1C  Date/Time Value Ref Range Status  03/25/2021 12:00 AM 8.5  Final   HbA1c, POC (controlled diabetic range)  Date/Time Value Ref Range Status  02/03/2022 11:07 AM 8.1 (A) 0.0 - 7.0 % Final  07/25/2021 09:51 AM 8.7 (A) 0.0 - 7.0 % Final   Hgb A1c MFr Bld  Date/Time Value Ref Range Status  06/02/2022 07:40 AM 8.8 (H) 4.8 - 5.6 % Final    Comment:    (NOTE)         Prediabetes: 5.7 - 6.4         Diabetes: >6.4         Glycemic control for adults with diabetes: <7.0     CBG: Recent Labs  Lab 06/12/22 1126 06/12/22 1713 06/12/22 2103 06/13/22 0054 06/13/22 0807  GLUCAP 172* 112* 151* 134* 172*    Review of Systems:   10 point review of system taken, please see HPI for positives and negatives.  Poor historian secondary to  dementia.   Past Medical History:  She,  has a past medical history of Anemia, Cataract, Chronic kidney disease, CVA (cerebral infarction), Diabetes mellitus with ESRD (end-stage renal disease) ([place]), Dialysis patient ([place]), Fistula, GERD (gastroesophageal reflux disease), Hypertension, Renal disorder, Shortness of  breath, and Stroke ([place]).   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;  Surgeon: Newt Minion, MD;  Location: [place];  Service: Orthopedics;  Laterality: Right;   AV FISTULA PLACEMENT Right 09/08/2013   Procedure: CREATION OF RIGHT BRACHIAL CEPHALIC ARTERIOVENOUS FISTULA ;  Surgeon: Mal Misty, MD;  Location: [place];  Service: Vascular;  Laterality: Right;   [place] Right 01/26/2014   Procedure: Right Arm BASILIC VEIN TRANSPOSITION;  Surgeon: Mal Misty, MD;  Location: [place];  Service: Vascular;  Laterality: Right;   BIOPSY  09/10/2021   Procedure: BIOPSY;  Surgeon: Harvel Quale, MD;  [facility];  Service: Gastroenterology;;  gastric   BIOPSY  10/07/2021   Procedure: BIOPSY;  Surgeon: Irving Copas., MD;  [facility];  Service: Gastroenterology;;   BIOPSY  04/17/2022   Procedure: BIOPSY;  Surgeon: Irving Copas., MD;  Location: [facility];  Service: Gastroenterology;;   CATARACT EXTRACTION W/PHACO  11/20/2011   Procedure: CATARACT EXTRACTION PHACO AND INTRAOCULAR LENS PLACEMENT ([place]);  Surgeon: Tonny Branch, MD;  [facility]: [facility];  Service: Ophthalmology;  Laterality: Right;  CDE 18.82   CATARACT EXTRACTION W/PHACO Left 11/18/2012   Procedure: CATARACT EXTRACTION PHACO AND INTRAOCULAR LENS PLACEMENT ([facility]);  Surgeon: Tonny Branch, MD;  [facility]: [facility];  Service: Ophthalmology;  Laterality: Left;  CDE: 18.97   COLONOSCOPY N/A 02/09/2013   Procedure: COLONOSCOPY;  Surgeon: Rogene Houston, MD;  [facility];  Service: Endoscopy;  Laterality: N/A;  305-moved to 220 Ann to notify pt   COLONOSCOPY N/A 07/08/2018   Procedure: COLONOSCOPY;  Surgeon: Rogene Houston, MD;  [facility];  Service: Endoscopy;  Laterality: N/A;  930   ENDOSCOPIC MUCOSAL RESECTION N/A 10/07/2021   Procedure: ENDOSCOPIC MUCOSAL RESECTION;  Surgeon: Rush Landmark Telford Nab., MD;  [facility];  Service: Gastroenterology;  Laterality: N/A;   ESOPHAGOGASTRODUODENOSCOPY N/A 04/17/2022   Procedure: ESOPHAGOGASTRODUODENOSCOPY (EGD);  Surgeon: Irving Copas., MD;  Location: [facility];  Service: Gastroenterology;  Laterality: N/A;   ESOPHAGOGASTRODUODENOSCOPY (EGD) WITH PROPOFOL N/A 09/10/2021   Procedure: ESOPHAGOGASTRODUODENOSCOPY (EGD) WITH PROPOFOL;  Surgeon: Harvel Quale, MD;  [facility];  Service: Gastroenterology;  Laterality: N/A;   ESOPHAGOGASTRODUODENOSCOPY (EGD) WITH PROPOFOL N/A 10/07/2021   Procedure: ESOPHAGOGASTRODUODENOSCOPY (EGD) WITH PROPOFOL;  Surgeon: Rush Landmark Telford Nab., MD;  [facility];  Service: Gastroenterology;  Laterality: N/A;   EUS N/A 10/07/2021   Procedure: UPPER ENDOSCOPIC ULTRASOUND (EUS) RADIAL;  Surgeon: Irving Copas., MD;  [facility];  Service: Gastroenterology;  Laterality: N/A;   GASTRIC VARICES BANDING  10/07/2021   Procedure: duodenal tumor BANDING;  Surgeon: Rush Landmark Telford Nab., MD;  Location: [facility];  Service: Gastroenterology;;  with emr kit   HEMOSTASIS CLIP PLACEMENT  10/07/2021   Procedure: HEMOSTASIS CLIP PLACEMENT;  Surgeon: Irving Copas., MD;  Location: [facility];  Service: Gastroenterology;;   INSERTION OF DIALYSIS CATHETER Right 06/24/2013   Procedure: INSERTION OF DIALYSIS CATHETER: Ultrasound guided;  Surgeon: Serafina Mitchell, MD;  Location: [place];  Service: Vascular;  Laterality: Right;   LIGATION OF ARTERIOVENOUS  FISTULA Right 01/26/2014   Procedure: LIGATION OF  ARTERIOVENOUS  FISTULA;  Surgeon: Mal Misty, MD;  Location: [place];  Service: Vascular;  Laterality: Right;   LOOP RECORDER IMPLANT N/A 07/21/2013   Procedure: LOOP RECORDER IMPLANT;  Surgeon:  Evans Lance, MD;  Location: [facility];  Service: Cardiovascular;  Laterality: N/A;   POLYPECTOMY  07/08/2018   Procedure: POLYPECTOMY;  Surgeon: Rogene Houston, MD;  [facility];  Service: Endoscopy;;  Descending colon polyps x 2    SUBMUCOSAL LIFTING INJECTION  10/07/2021   Procedure: SUBMUCOSAL LIFTING INJECTION;  Surgeon: Irving Copas., MD;  [facility];  Service: Gastroenterology;;   TEE WITHOUT CARDIOVERSION N/A 07/21/2013   Procedure: TRANSESOPHAGEAL ECHOCARDIOGRAM (TEE);  Surgeon: Dorothy Spark, MD;  Location: [place];  Service: Cardiovascular;  Laterality: N/A;   UPPER ESOPHAGEAL ENDOSCOPIC ULTRASOUND (EUS) N/A 04/17/2022   Procedure: UPPER ESOPHAGEAL ENDOSCOPIC ULTRASOUND (EUS);  Surgeon: Irving Copas., MD;  Location: [facility];  Service: Gastroenterology;  Laterality: N/A;     Social History:   reports that she has never smoked. She has never used smokeless tobacco. She reports that she does not drink alcohol and does not use drugs.   Family History:  Her family history includes Cancer in her brother and sister. There is no history of Anesthesia problems, Hypotension, Malignant hyperthermia, or Pseudochol deficiency.   Allergies Allergies  Allergen Reactions   Ambien [Zolpidem] Other (See Comments)    Hallucinations    Reglan [Metoclopramide] Other (See Comments)    "makes me crazy"     Home Medications  Prior to Admission medications   Medication Sig Start Date End Date Taking? Authorizing Provider  albuterol (VENTOLIN HFA) 108 (90 Base) MCG/ACT inhaler Inhale 2 puffs into the lungs every 6 (six) hours as needed for wheezing or shortness of breath. 11/21/20  Yes Gerlene Fee, NP  aspirin 325 MG tablet Take 650 mg by mouth  daily. 07/09/18  Yes Rehman, Mechele Dawley, MD  benzonatate (TESSALON) 100 MG capsule Take 1 capsule (100 mg total) by mouth 3 (three) times daily as needed for cough. 06/03/22  Yes Shah, Pratik D, DO  cyanocobalamin (,VITAMIN B-12,) 1000 MCG/ML injection Inject 1,000 mcg into the muscle every 30 (thirty) days. 11/01/14  Yes [provider]  diclofenac Sodium (VOLTAREN) 1 % GEL Apply 4 g topically 4 (four) times daily as needed. Apply to bilateral knees 03/16/21  Yes Scot Jun, FNP  insulin NPH-regular Human (HUMULIN 70/30) (70-30) 100 UNIT/ML injection Inject 5-14 Units into the skin 2 (two) times daily with a meal. Patient taking differently: Inject 5-10 Units into the skin 2 (two) times daily with a meal. Take 10 units in the morning and 5 units at bedtime 02/03/22  Yes Nida, Marella Chimes, MD  labetalol (NORMODYNE) 200 MG tablet Take 200 mg by mouth 2 (two) times daily. 07/26/21  Yes [provider]  multivitamin (RENA-VIT) TABS tablet Take 1 tablet by mouth at bedtime. 08/21/14  Yes Kirsteins, Luanna Salk, MD  omeprazole (PRILOSEC) 40 MG capsule TAKE ONE CAPSULE BY MOUTH TWICE DAILY BEFORE A meal Patient taking differently: Take 40 mg by mouth in the morning and at bedtime. 04/16/22  Yes Mansouraty, Telford Nab., MD  sevelamer carbonate (RENVELA) 800 MG tablet Take 1 tablet (800 mg total) by mouth 2 (two) times daily before lunch and supper. 11/21/20  Yes Gerlene Fee, NP  BD INSULIN SYRINGE U/F 31G X 5/16" 0.3 ML MISC Inject 1 each into the skin 2 (two) times daily. as directed 11/21/20   Gerlene Fee, NP     Critical care time: Ferol Luz Taiwo Fish ACNP Acute Care Nurse Practitioner [place] Please consult Amion 06/13/2022,  10:26 AM

## 2022-06-13 NOTE — Plan of Care (Signed)
Given high inpatient HD census/staffing, will move HD to 12/30, will likely resume MWF thereafter.  Gean Quint, MD [facility]

## 2022-06-13 NOTE — Assessment & Plan Note (Deleted)
Magnesium of 1.8 this morning.

## 2022-06-13 NOTE — Progress Notes (Signed)
NEW ADMISSION NOTE New Admission Note:   Arrival Method: Stretcher with [facility]  Mental Orientation: Alert and oriented x3 Telemetry: None Assessment: Completed Skin: Intact IV: Left forearm IV, saline locked Pain: 0/10 Tubes: None Safety Measures: Safety Fall Prevention Plan has been given, discussed and implemented. Admission: Initiated [facility] [place] Orientation: Patient has been orientated to the room, unit and staff.  Family: Not present at the bedside  Orders have been reviewed and implemented. Will continue to monitor the patient. Call light has been placed within reach and bed alarm has been activated.   Sharmon Revere, RN

## 2022-06-13 NOTE — Consult Note (Addendum)
[address]       [place] [address]             [phone]        Aundra H Buffin [place] Medical Record #[id number] Date of Birth: 1941-04-18  Referring: [place] Primary Care: [name], MD Primary Cardiologist:None  Chief Complaint:    Chief Complaint  Patient presents with   Shortness of Breath   History of Present Illness:      [name] is an 81 yo female with history of ESRD (MWF), Type 2 DM, CVA, Dementia and HTN.  She presented to the ED originally on 06/01/2022 with complaints of 1 month history of productive cough and left sided chest soreness.  She denied fever, nausea, vomiting, and abdominal pain.  Workup in ED at that time showed normal oxygenation, leukocytosis with left a shift.  Viral testing was negative.  CT of the chest showed multifocal pneumonia in the left upper lobe and and right middle lobe.  She was also noted to have right and left pleural effusions at that time.  She was admitted and treated with IV Ceftriaxone and Azithromycin.  She was ultimately discharged home on 12/19 with oral antibiotics.  She again presented to the [facility] ED on 12/25 with complaints of shortness of breath.  She stated she had completed her antibiotic regimen.  She complained of pain along her left side which was worse when lying down.  She continued to experience intermittent cough.  She was again admitted and started on IV ABX.  It was felt she was likely volume overloaded due to holiday and dialysis scheduled being adjusted with increased time between sessions.  She required aggressive treatment of her HTN.  CXR showed large left pleural effusion and Thoracentesis was performed on 12/26 with removal of 1200 ml of fluid.  There was mild re-accumulation of pleural fluid on the left and repeat Thoracentesis was attempted, however 100 cc of bloody fluid was removed.  They were concerned this was clot the procedure was discontinued.  Pulmonary was  consulted for possible chest tube placement, however they felt if the fluid was clot, that a normal chest tube would not remove this.  Cardiothoracic surgery has been consulted for possible surgical intervention.  The patients family is in her room with her.  The patient is not sure if her breathing is better.  She states she still has shortness of breath and chest discomfort at times.  She has been dialyzed twice since admission and is due to be dialysis tomorrow, due to schedule being full today.  Current Activity/ Functional Status: Patient was not independent with mobility/ambulation, transfers, ADL's, IADL's.   Zubrod Score: At the time of surgery this patient's most appropriate activity status/level should be described as: _0     0    Normal activity, no symptoms _1     1    Restricted in physical strenuous activity but ambulatory, able to do out light work _2     2    Ambulatory and capable of self care, unable to do work activities, up and about                 more than 50%  Of the time                            _3     3    Only limited self care, in  bed greater than 50% of waking hours _0     4    Completely disabled, no self care, confined to bed or chair _1     5    Moribund  Past Medical History:  Diagnosis Date   Anemia    Cataract    Chronic kidney disease    CVA (cerebral infarction)    Diabetes mellitus with ESRD (end-stage renal disease) ([place])    Type 2   Dialysis patient ([place])    M, W, F   Fistula    R arm   GERD (gastroesophageal reflux disease)    Hypertension    Renal disorder    Shortness of breath    Stroke [facility])    right side weakness    Past Surgical History:  Procedure Laterality Date   AMPUTATION Right 01/13/2020   Procedure: AMPUTATION RIGHT FOOT 1ST RAY;  Surgeon: Newt Minion, MD;  Location: [place];  Service: Orthopedics;  Laterality: Right;   AV FISTULA PLACEMENT Right 09/08/2013   Procedure: CREATION OF RIGHT BRACHIAL CEPHALIC ARTERIOVENOUS FISTULA ;   Surgeon: Mal Misty, MD;  Location: [place];  Service: Vascular;  Laterality: Right;   [place] Right 01/26/2014   Procedure: Right Arm BASILIC VEIN TRANSPOSITION;  Surgeon: Mal Misty, MD;  Location: [place];  Service: Vascular;  Laterality: Right;   BIOPSY  09/10/2021   Procedure: BIOPSY;  Surgeon: Harvel Quale, MD;  [facility];  Service: Gastroenterology;;  gastric   BIOPSY  10/07/2021   Procedure: BIOPSY;  Surgeon: Irving Copas., MD;  Location: [facility];  Service: Gastroenterology;;   BIOPSY  04/17/2022   Procedure: BIOPSY;  Surgeon: Irving Copas., MD;  Location: [facility];  Service: Gastroenterology;;   CATARACT EXTRACTION W/PHACO  11/20/2011   Procedure: CATARACT EXTRACTION PHACO AND INTRAOCULAR LENS PLACEMENT ([place]);  Surgeon: Tonny Branch, MD;  [facility];  Service: Ophthalmology;  Laterality: Right;  CDE 18.82   CATARACT EXTRACTION W/PHACO Left 11/18/2012   Procedure: CATARACT EXTRACTION PHACO AND INTRAOCULAR LENS PLACEMENT ([facility]);  Surgeon: Tonny Branch, MD;  [facility]: [facility];  Service: Ophthalmology;  Laterality: Left;  CDE: 18.97   COLONOSCOPY N/A 02/09/2013   Procedure: COLONOSCOPY;  Surgeon: Rogene Houston, MD;  [facility];  Service: Endoscopy;  Laterality: N/A;  305-moved to 220 Ann to notify pt   COLONOSCOPY N/A 07/08/2018   Procedure: COLONOSCOPY;  Surgeon: Rogene Houston, MD;  [facility];  Service: Endoscopy;  Laterality: N/A;  930   ENDOSCOPIC MUCOSAL RESECTION N/A 10/07/2021   Procedure: ENDOSCOPIC MUCOSAL RESECTION;  Surgeon: Rush Landmark Telford Nab., MD;  [facility];  Service: Gastroenterology;  Laterality: N/A;   ESOPHAGOGASTRODUODENOSCOPY N/A 04/17/2022   Procedure: ESOPHAGOGASTRODUODENOSCOPY (EGD);  Surgeon: Irving Copas., MD;  Location: [facility];  Service: Gastroenterology;  Laterality: N/A;   ESOPHAGOGASTRODUODENOSCOPY (EGD) WITH PROPOFOL N/A 09/10/2021    Procedure: ESOPHAGOGASTRODUODENOSCOPY (EGD) WITH PROPOFOL;  Surgeon: Harvel Quale, MD;  [facility];  Service: Gastroenterology;  Laterality: N/A;   ESOPHAGOGASTRODUODENOSCOPY (EGD) WITH PROPOFOL N/A 10/07/2021   Procedure: ESOPHAGOGASTRODUODENOSCOPY (EGD) WITH PROPOFOL;  Surgeon: Rush Landmark Telford Nab., MD;  [facility];  Service: Gastroenterology;  Laterality: N/A;   EUS N/A 10/07/2021   Procedure: UPPER ENDOSCOPIC ULTRASOUND (EUS) RADIAL;  Surgeon: Irving Copas., MD;  [facility];  Service: Gastroenterology;  Laterality: N/A;   GASTRIC VARICES BANDING  10/07/2021   Procedure: duodenal tumor BANDING;  Surgeon: Justice Britain  Brooke Bonito., MD;  Location: [facility];  Service: Gastroenterology;;  with emr kit   HEMOSTASIS CLIP PLACEMENT  10/07/2021   Procedure: HEMOSTASIS CLIP PLACEMENT;  Surgeon: Irving Copas., MD;  Location: [facility];  Service: Gastroenterology;;   INSERTION OF DIALYSIS CATHETER Right 06/24/2013   Procedure: INSERTION OF DIALYSIS CATHETER: Ultrasound guided;  Surgeon: Serafina Mitchell, MD;  Location: [place];  Service: Vascular;  Laterality: Right;   LIGATION OF ARTERIOVENOUS  FISTULA Right 01/26/2014   Procedure: LIGATION OF ARTERIOVENOUS  FISTULA;  Surgeon: Mal Misty, MD;  Location: [place];  Service: Vascular;  Laterality: Right;   LOOP RECORDER IMPLANT N/A 07/21/2013   Procedure: LOOP RECORDER IMPLANT;  Surgeon: Evans Lance, MD;  Location: [facility];  Service: Cardiovascular;  Laterality: N/A;   POLYPECTOMY  07/08/2018   Procedure: POLYPECTOMY;  Surgeon: Rogene Houston, MD;  [facility];  Service: Endoscopy;;  Descending colon polyps x 2    SUBMUCOSAL LIFTING INJECTION  10/07/2021   Procedure: SUBMUCOSAL LIFTING INJECTION;  Surgeon: Irving Copas., MD;  [facility];  Service: Gastroenterology;;   TEE WITHOUT CARDIOVERSION N/A 07/21/2013   Procedure: TRANSESOPHAGEAL  ECHOCARDIOGRAM (TEE);  Surgeon: Dorothy Spark, MD;  Location: [place];  Service: Cardiovascular;  Laterality: N/A;   UPPER ESOPHAGEAL ENDOSCOPIC ULTRASOUND (EUS) N/A 04/17/2022   Procedure: UPPER ESOPHAGEAL ENDOSCOPIC ULTRASOUND (EUS);  Surgeon: Irving Copas., MD;  Location: [facility];  Service: Gastroenterology;  Laterality: N/A;    Social History   Tobacco Use  Smoking Status Never  Smokeless Tobacco Never    Social History   Substance and Sexual Activity  Alcohol Use No     Allergies  Allergen Reactions   Ambien [Zolpidem] Other (See Comments)    Hallucinations    Reglan [Metoclopramide] Other (See Comments)    "makes me crazy"    Current Facility-Administered Medications  Medication Dose Route Frequency Provider Last Rate Last Admin   acetaminophen (TYLENOL) tablet 650 mg  650 mg Oral Q6H PRN Zierle-Ghosh, Asia B, DO   650 mg at 06/10/22 2255   Or   acetaminophen (TYLENOL) suppository 650 mg  650 mg Rectal Q6H PRN Zierle-Ghosh, Asia B, DO       albuterol (VENTOLIN HFA) 108 (90 Base) MCG/ACT inhaler 2 puff  2 puff Inhalation Q2H PRN Zierle-Ghosh, Asia B, DO   2 puff at 06/10/22 0211   aspirin tablet 650 mg  650 mg Oral Daily Zierle-Ghosh, Asia B, DO   650 mg at 06/13/22 0838   ceFEPIme (MAXIPIME) 2 g in sodium chloride 0.9 % 100 mL IVPB  2 g Intravenous Q M,W,F-HD Emokpae, Courage, MD 200 mL/hr at 06/11/22 1720 2 g at 06/11/22 1720   Chlorhexidine Gluconate Cloth 2 % PADS 6 each  6 each Topical Q0600 Donato Heinz, MD   6 each at 06/13/22 0531   dextromethorphan-guaiFENesin (MUCINEX DM) 30-600 MG per 12 hr tablet 1 tablet  1 tablet Oral BID Zierle-Ghosh, Asia B, DO   1 tablet at 06/13/22 0838   heparin injection 5,000 Units  5,000 Units Subcutaneous Q8H Zierle-Ghosh, Asia B, DO   5,000 Units at 06/13/22 1400   hydrocerin (EUCERIN) cream   Topical BID Mariel Aloe, MD   Given at 06/13/22 1400   hydrOXYzine (ATARAX) tablet 25 mg  25 mg Oral TID PRN  Roxan Hockey, MD   25 mg at 06/12/22 1502   insulin aspart (novoLOG) injection 0-15 Units  0-15 Units Subcutaneous TID WC  Zierle-Ghosh, [place] B, DO   3 Units at 06/13/22 1228   insulin aspart (novoLOG) injection 0-5 Units  0-5 Units Subcutaneous QHS Zierle-Ghosh, Asia B, DO   2 Units at 06/11/22 2205   labetalol (NORMODYNE) tablet 200 mg  200 mg Oral BID Zierle-Ghosh, Asia B, DO   200 mg at 06/13/22 1229   lidocaine (PF) (XYLOCAINE) 1 % injection 5 mL  5 mL Intradermal PRN Donato Heinz, MD       lidocaine-prilocaine (EMLA) cream 1 Application  1 Application Topical PRN Donato Heinz, MD       morphine (PF) 2 MG/ML injection 2 mg  2 mg Intravenous Q2H PRN Zierle-Ghosh, Asia B, DO   2 mg at 06/10/22 0231   ondansetron (ZOFRAN) tablet 4 mg  4 mg Oral Q6H PRN Zierle-Ghosh, Asia B, DO   4 mg at 06/11/22 2204   Or   ondansetron (ZOFRAN) injection 4 mg  4 mg Intravenous Q6H PRN Zierle-Ghosh, Asia B, DO   4 mg at 06/09/22 2354   oxyCODONE (Oxy IR/ROXICODONE) immediate release tablet 5 mg  5 mg Oral Q4H PRN Zierle-Ghosh, Asia B, DO   5 mg at 06/12/22 0232   pantoprazole (PROTONIX) EC tablet 40 mg  40 mg Oral Daily Zierle-Ghosh, Asia B, DO   40 mg at 06/13/22 9562   pentafluoroprop-tetrafluoroeth (GEBAUERS) aerosol 1 Application  1 Application Topical PRN Donato Heinz, MD       sevelamer carbonate (RENVELA) tablet 800 mg  800 mg Oral BID AC Zierle-Ghosh, Asia B, DO   800 mg at 06/13/22 1228   vancomycin (VANCOREADY) IVPB 750 mg/150 mL  750 mg Intravenous Q M,W,F-HD Emokpae, Courage, MD       vancomycin variable dose per unstable renal function (pharmacist dosing)   Does not apply See admin instructions Einar Grad, [facility]        Medications Prior to Admission  Medication Sig Dispense Refill Last Dose   albuterol (VENTOLIN HFA) 108 (90 Base) MCG/ACT inhaler Inhale 2 puffs into the lungs every 6 (six) hours as needed for wheezing or shortness of breath. 1 each 0 unknown   aspirin 325  MG tablet Take 650 mg by mouth daily.   06/09/2022   benzonatate (TESSALON) 100 MG capsule Take 1 capsule (100 mg total) by mouth 3 (three) times daily as needed for cough. 20 capsule 0 06/09/2022   cyanocobalamin (,VITAMIN B-12,) 1000 MCG/ML injection Inject 1,000 mcg into the muscle every 30 (thirty) days.  11 unknown   [EXPIRED] dextromethorphan-guaiFENesin (MUCINEX DM) 30-600 MG 12hr tablet Take 1 tablet by mouth 2 (two) times daily for 7 days. 14 tablet 0 06/09/2022   diclofenac Sodium (VOLTAREN) 1 % GEL Apply 4 g topically 4 (four) times daily as needed. Apply to bilateral knees 100 g 0 unknown   insulin NPH-regular Human (HUMULIN 70/30) (70-30) 100 UNIT/ML injection Inject 5-14 Units into the skin 2 (two) times daily with a meal. (Patient taking differently: Inject 5-10 Units into the skin 2 (two) times daily with a meal. Take 10 units in the morning and 5 units at bedtime) 20 mL 1 06/09/2022   labetalol (NORMODYNE) 200 MG tablet Take 200 mg by mouth 2 (two) times daily.   06/09/2022 at AM   multivitamin (RENA-VIT) TABS tablet Take 1 tablet by mouth at bedtime. 30 tablet 0 06/08/2022   omeprazole (PRILOSEC) 40 MG capsule TAKE ONE CAPSULE BY MOUTH TWICE DAILY BEFORE A meal (Patient taking differently: Take 40 mg by mouth in  the morning and at bedtime.) 180 capsule 3 06/09/2022   sevelamer carbonate (RENVELA) 800 MG tablet Take 1 tablet (800 mg total) by mouth 2 (two) times daily before lunch and supper. 60 tablet 0 06/09/2022   BD INSULIN SYRINGE U/F 31G X 5/16" 0.3 ML MISC Inject 1 each into the skin 2 (two) times daily. as directed 100 each 0     Family History  Problem Relation Age of Onset   Cancer Sister    Cancer Brother    Anesthesia problems Neg Hx    Hypotension Neg Hx    Malignant hyperthermia Neg Hx    Pseudochol deficiency Neg Hx      Review of Systems:   ROS     Cardiac Review of Systems: Y or  [    ]= no  Chest Pain [  Y  ]  Resting SOB [ Y  ] Exertional SOB  [ Y ]   Orthopnea [  ]   Pedal Edema [   ]    Palpitations [  ] Syncope  [  ]   Presyncope [   ]  General Review of Systems: [Y] = yes [  ]=no Constitional: recent weight change [  ]; anorexia [  ]; fatigue [Y  ]; nausea [  ]; night sweats [  ]; fever [  ]; or chills [  ]                                                               Dental: Last Dentist visit:   Eye : blurred vision [  ]; diplopia [   ]; vision changes [  ];  Amaurosis fugax[  ]; Resp: cough [Y  ];  wheezing[  ];  hemoptysis[  ]; shortness of breath[ Y ]; paroxysmal nocturnal dyspnea[Y  ]; dyspnea on exertion[Y  ]; or orthopnea[  ];  GI:  gallstones[  ], vomiting[  ];  dysphagia[  ]; melena[  ];  hematochezia [  ]; heartburn[  ];   Hx of  Colonoscopy[  ]; GU: kidney stones [  ]; hematuria[  ];   dysuria [  ];  nocturia[  ];  history of     obstruction [  ]; urinary frequency [  ] ESRD, MWF dialysis             Skin: rash, swelling[  ];, hair loss[  ];  peripheral edema[ Y ];  or itching[  ]; Musculosketetal: myalgias[  ];  joint swelling[  ];  joint erythema[  ];  joint pain[  ];  back pain[  ];  Heme/Lymph: bruising[  ];  bleeding[  ];  anemia[  ];  Neuro: TIA[  ];  headaches[  ];  stroke[Y  ];  vertigo[  ];  seizures[  ];   paresthesias[  ];  difficulty walking[  ];  Psych:depression[  ]; anxiety[  ];  Endocrine: diabetes[ Y ];  thyroid dysfunction[  ];  Physical Exam: BP (!) 161/67 (BP Location: Left Arm)   Pulse 87   Temp 98.8 F (37.1 C) (Oral)   Resp 18   Ht _0  (1.702 m)   Wt 73.5 kg   SpO2 99%   BMI 25.38 kg/m   General appearance: alert, no distress,  and morbidly obese Head: Normocephalic, without obvious abnormality, atraumatic Resp: diminished breath sounds bilaterally and L>R Cardio: regular rate and rhythm GI: soft, non-tender; bowel sounds normal; no masses,  no organomegaly Extremities: Right foot, surgical intervention Neurologic: grossly intact, dementia, trouble with remembering  Diagnostic Studies &  Laboratory data:     Recent Radiology Findings:   ECHOCARDIOGRAM SCANNED RESULT  Result Date: 06/13/2022 Ordered by an unspecified provider.  US THORACENTESIS ASP PLEURAL SPACE W/IMG GUIDE  Result Date: 06/12/2022 INDICATION: Recurrent LEFT pleural effusion EXAM: ULTRASOUND GUIDED THERAPEUTIC LEFT THORACENTESIS MEDICATIONS: None. COMPLICATIONS: None immediate. PROCEDURE: An ultrasound guided thoracentesis was thoroughly discussed with the patient and questions answered. The benefits, risks, alternatives and complications were also discussed. The patient understands and wishes to proceed with the procedure. Written consent was obtained. Ultrasound was performed to localize and mark an adequate pocket of fluid in the LEFT chest. The area was then prepped and draped in the normal sterile fashion. 1% Lidocaine was used for local anesthesia. Under ultrasound guidance a 6 Fr Safe-T-Centesis catheter was introduced. No fluid could be aspirated. Under direct sonographic guidance, a 5 [place] Yueh catheter was placed into the effusion. Only about 100 cc of serosanguineous to bloody fluid could be aspirated. Further ultrasound interrogation demonstrates that the fluid is complicated, containing diffuse internal echogenicity question clot, as well as internal septations, consistent with hemothorax new since previous thoracentesis. Procedure was then terminated. The catheter was removed and a dressing applied. FINDINGS: A only 100 mL of LEFT pleural fluid was removed. Observed recurrent LEFT pleural effusion is complicated containing septations and scattered low level internal echogenicity question clot. IMPRESSION: Ultrasound guided therapeutic LEFT thoracentesis yielding only 100 mL of pleural fluid as above. Electronically Signed   By: Lavonia Dana M.D.   On: 06/12/2022 11:48   DG Chest 1 View  Result Date: 06/12/2022 CLINICAL DATA:  Post LEFT thoracentesis EXAM: CHEST  1 VIEW COMPARISON:  Exam at 1115 hours  compared to 06/12/2022 at 0851 hours FINDINGS: Enlargement of cardiac silhouette with stable loop recorder. Pulmonary vascular congestion. Elevation of LEFT diaphragm. Persistent LEFT pleural effusion and basilar atelectasis. No pneumothorax following thoracentesis. Osseous structures unremarkable. IMPRESSION: No pneumothorax following thoracentesis. Electronically Signed   By: Lavonia Dana M.D.   On: 06/12/2022 11:43   DG Chest 2 View  Result Date: 06/12/2022 CLINICAL DATA:  Dyspnea. EXAM: CHEST - 2 VIEW COMPARISON:  June 12, 2022 FINDINGS: Image rotated to LEFT. Accounting for this no change in cardiac enlargement and fullness of hilar structures. Slight enlargement of moderately large LEFT-sided pleural effusion since previous imaging, associated with LEFT lower lobe consolidative changes. Pleural fluid in the LEFT chest is also likely partially loculated in the major fissure based on lateral projection. Mild increased interstitial markings. On limited assessment no acute skeletal process. Cardiac loop recorder projects over the LEFT chest. IMPRESSION: 1. Slight enlargement of moderately large LEFT-sided pleural effusion since previous imaging, associated with LEFT lower lobe consolidative changes. Pleural fluid not tracks into the major fissure in the LEFT chest. 2. LEFT upper lobe process better demonstrated on recent CT and as compared to recent chest radiograph displays little change. 3. Constellation of findings while potentially related to pneumonia should be followed to ensure clearing and exclude the possibility of underlying neoplasm. Electronically Signed   By: Zetta Bills M.D.   On: 06/12/2022 09:11     I have independently reviewed the above radiologic studies and discussed with the patient   Recent Lab Findings:  Lab Results  Component Value Date   WBC 12.1 (H) 06/13/2022   HGB 8.3 (L) 06/13/2022   HCT 24.9 (L) 06/13/2022   PLT 132 (L) 06/13/2022   GLUCOSE 210 (H) 06/11/2022    CHOL 128 04/03/2022   TRIG 76 04/03/2022   HDL 50 04/03/2022   LDLCALC 63 04/03/2022   ALT 13 06/10/2022   AST 17 06/10/2022   NA 137 06/11/2022   K 3.7 06/11/2022   CL 97 (L) 06/11/2022   CREATININE 4.54 (H) 06/11/2022   BUN 17 06/11/2022   CO2 28 06/11/2022   TSH 3.600 04/03/2022   INR 1.4 (H) 01/05/2020   HGBA1C 8.8 (H) 06/02/2022    Assessment / Plan:     Left and Right Pleural Effusion- 1200 cc removed on 12/26, repeat thoracentesis attempted due to suspected re-occurrence however it was felt this could be clot... no recent CT scan obtained ESRD- patient came in volume overloaded due to adjusted dialysis schedule with holiday, per Nephrology, are symptoms improved when patient is dialyzed to baseline H/O Stroke/Dementia HTN DM, poorly controlled  Plan:  Patient needs repeat CT of the chest to fully assess extent of possible pleural effusions.. Patient with multiple co-morbities which would make her a high risk surgical candidate.  Continue supportive care, once CT scan is complete we can offer complete recommendations.   I  spent 55 minutes counseling the patient face to face.   Ellwood Handler, PA-C 06/13/2022 3:59 PM  81yo female with left pleural fluid collection.  Dominant collection, appears in continuity.  Would recommend CT guided drain.  Triston Skare Bary Leriche

## 2022-06-13 NOTE — Progress Notes (Signed)
[facility] Progress Note   Assessment/ Plan:   Dialyzes at [facility] MWF  4 hours  EDW 75.5. HD Bath 2K, Dialyzer unknown, Heparin no. Access AVF right-  has post bleeding a lot.  Calcitriol 1 mcg q tx   Assessment/Plan:   SOB - markedly improved after HD.  off of supplemental oxygen.  Has L sided loculated pleural effusion, s/p thora 100 mL off. Pulm following, pending IR consult for possible chest tube vs thora ESRD - On schedule MWF, HD today HCAP- on vanc/ cefepime HTN / volume - elevated initially but improved.  Will UF as tolerated.  DM type 2 - per primary Disposition - pending  Subjective:    [facility]. No acute events, no complaints. Family at bedside.   Objective:   BP (!) 161/67 (BP Location: Left Arm)   Pulse 87   Temp 98.8 F (37.1 C) (Oral)   Resp 18   Ht '5\' 7"'$  (1.702 m)   Wt 73.5 kg   SpO2 99%   BMI 25.38 kg/m   Physical Exam: Gen: NAD CVS: RRR Resp: normal WOB Abd: soft Ext: no LE edema Neuro: awake, alert ACCESS: R AVF + T/B  Labs: BMET Recent Labs  Lab 06/09/22 2000 06/10/22 0353 06/11/22 0345  NA 139 139 137  K 3.5 3.9 3.7  CL 99 101 97*  CO2 '28 25 28  '$ GLUCOSE 187* 184* 210*  BUN 28* 31* 17  CREATININE 7.43* 7.73* 4.54*  CALCIUM 8.3* 7.9* 7.7*  PHOS  --   --  2.2*   CBC Recent Labs  Lab 06/10/22 0353 06/11/22 0345 06/11/22 2019 06/12/22 0327 06/13/22 0157  WBC 16.9* 12.6* 12.8* 13.9* 12.1*  NEUTROABS 14.0* 10.2*  --  11.2* 9.0*  HGB 8.3* 8.0* 8.4* 8.1* 8.3*  HCT 25.3* 24.2* 24.8* 24.1* 24.9*  MCV 95.8 95.3 94.3 93.4 96.5  PLT 134* 125* 120* 132* 132*      Medications:     aspirin  650 mg Oral Daily   Chlorhexidine Gluconate Cloth  6 each Topical Q0600   dextromethorphan-guaiFENesin  1 tablet Oral BID   heparin  5,000 Units Subcutaneous Q8H   hydrocerin   Topical BID   insulin aspart  0-15 Units Subcutaneous TID WC   insulin aspart  0-5 Units Subcutaneous QHS   labetalol  200 mg  Oral BID   pantoprazole  40 mg Oral Daily   sevelamer carbonate  800 mg Oral BID AC   vancomycin variable dose per unstable renal function (pharmacist dosing)   Does not apply See admin instructions

## 2022-06-13 NOTE — Assessment & Plan Note (Signed)
Mild and likely reactive, secondary to infection. Stable.

## 2022-06-14 ENCOUNTER — Inpatient Hospital Stay (HOSPITAL_COMMUNITY): Payer: 59

## 2022-06-14 DIAGNOSIS — R410 Disorientation, unspecified: Secondary | ICD-10-CM | POA: Insufficient documentation

## 2022-06-14 DIAGNOSIS — J189 Pneumonia, unspecified organism: Secondary | ICD-10-CM | POA: Diagnosis not present

## 2022-06-14 DIAGNOSIS — J90 Pleural effusion, not elsewhere classified: Secondary | ICD-10-CM | POA: Diagnosis not present

## 2022-06-14 DIAGNOSIS — N186 End stage renal disease: Secondary | ICD-10-CM | POA: Diagnosis not present

## 2022-06-14 LAB — CBC WITH DIFFERENTIAL/PLATELET
Abs Immature Granulocytes: 0.14 10*3/uL — ABNORMAL HIGH (ref 0.00–0.07)
Basophils Absolute: 0 10*3/uL (ref 0.0–0.1)
Basophils Relative: 0 %
Eosinophils Absolute: 0.2 10*3/uL (ref 0.0–0.5)
Eosinophils Relative: 1 %
HCT: 21.5 % — ABNORMAL LOW (ref 36.0–46.0)
Hemoglobin: 7.6 g/dL — ABNORMAL LOW (ref 12.0–15.0)
Immature Granulocytes: 1 %
Lymphocytes Relative: 7 %
Lymphs Abs: 1 10*3/uL (ref 0.7–4.0)
MCH: 32.6 pg (ref 26.0–34.0)
MCHC: 35.3 g/dL (ref 30.0–36.0)
MCV: 92.3 fL (ref 80.0–100.0)
Monocytes Absolute: 2.1 10*3/uL — ABNORMAL HIGH (ref 0.1–1.0)
Monocytes Relative: 14 %
Neutro Abs: 11.1 10*3/uL — ABNORMAL HIGH (ref 1.7–7.7)
Neutrophils Relative %: 77 %
Platelets: 122 10*3/uL — ABNORMAL LOW (ref 150–400)
RBC: 2.33 MIL/uL — ABNORMAL LOW (ref 3.87–5.11)
RDW: 16.5 % — ABNORMAL HIGH (ref 11.5–15.5)
WBC: 14.5 10*3/uL — ABNORMAL HIGH (ref 4.0–10.5)
nRBC: 0 % (ref 0.0–0.2)

## 2022-06-14 LAB — BASIC METABOLIC PANEL
Anion gap: 15 (ref 5–15)
BUN: 32 mg/dL — ABNORMAL HIGH (ref 8–23)
CO2: 26 mmol/L (ref 22–32)
Calcium: 7.7 mg/dL — ABNORMAL LOW (ref 8.9–10.3)
Chloride: 95 mmol/L — ABNORMAL LOW (ref 98–111)
Creatinine, Ser: 6.96 mg/dL — ABNORMAL HIGH (ref 0.44–1.00)
GFR, Estimated: 5 mL/min — ABNORMAL LOW (ref >60)
Glucose, Bld: 205 mg/dL — ABNORMAL HIGH (ref 70–99)
Potassium: 4.1 mmol/L (ref 3.5–5.1)
Sodium: 136 mmol/L (ref 135–145)

## 2022-06-14 LAB — CULTURE, BLOOD (ROUTINE X 2)
Culture: NO GROWTH
Culture: NO GROWTH
Special Requests: ADEQUATE
Special Requests: ADEQUATE

## 2022-06-14 LAB — MAGNESIUM: Magnesium: 1.8 mg/dL (ref 1.7–2.4)

## 2022-06-14 LAB — GLUCOSE, CAPILLARY
Glucose-Capillary: 176 mg/dL — ABNORMAL HIGH (ref 70–99)
Glucose-Capillary: 141 mg/dL — ABNORMAL HIGH (ref 70–99)
Glucose-Capillary: 195 mg/dL — ABNORMAL HIGH (ref 70–99)
Glucose-Capillary: 95 mg/dL (ref 70–99)

## 2022-06-14 MED ORDER — PIPERACILLIN-TAZOBACTAM IN DEX 2-0.25 GM/50ML IV SOLN
2.2500 g | Freq: Three times a day (TID) | INTRAVENOUS | Status: DC
  Administered 2022-06-14 – 2022-06-19 (×14): 2.25 g via INTRAVENOUS
  Filled 2022-06-14 (×14): qty 50

## 2022-06-14 MED ORDER — QUETIAPINE FUMARATE 25 MG PO TABS: 25.0000 mg | ORAL_TABLET | Freq: Every day | ORAL | Status: DC

## 2022-06-14 MED ORDER — QUETIAPINE FUMARATE 25 MG PO TABS
25.0000 mg | ORAL_TABLET | Freq: Every day | ORAL | Status: DC
  Administered 2022-06-14: 25 mg via ORAL
  Filled 2022-06-14: qty 1

## 2022-06-14 MED ORDER — VANCOMYCIN HCL 750 MG/150ML IV SOLN
750.0000 mg | Freq: Once | INTRAVENOUS | Status: AC
  Administered 2022-06-14: 750 mg via INTRAVENOUS
  Filled 2022-06-14: qty 150

## 2022-06-14 MED ORDER — SODIUM CHLORIDE 0.9 % IV SOLN
1.0000 g | INTRAVENOUS | Status: DC
  Administered 2022-06-14: 1 g via INTRAVENOUS
  Filled 2022-06-14: qty 10

## 2022-06-14 MED ORDER — VANCOMYCIN HCL 750 MG/150ML IV SOLN
750.0000 mg | INTRAVENOUS | Status: DC
  Filled 2022-06-14: qty 150

## 2022-06-14 NOTE — Assessment & Plan Note (Signed)
In setting of history of multiple vascular injuries and current hospitalization. Likely hospital related delirium. Patient is also on Cefepime which could also be the culprit. -Discontinue Cefepime -CT head -Delirium precautions -Seroquel qHS to help with sleep

## 2022-06-14 NOTE — Progress Notes (Signed)
[facility] Progress Note   Assessment/ Plan:   Dialyzes at [facility] MWF  4 hours  EDW 75.5. HD Bath 2K, Dialyzer unknown, Heparin no. Access AVF right-  has post bleeding a lot.  Calcitriol 1 mcg q tx   Assessment/Plan:   SOB - markedly improved after HD.  off of supplemental oxygen.  Has L sided loculated pleural effusion, s/p thora 100 mL off. CTS following ESRD - On schedule MWF, HD back on sched tomorrow (holiday sched) HCAP- on vanc/ cefepime HTN / volume - elevated initially but improved.  Will UF as tolerated.  DM type 2 - per primary Anemia- Fe profile ordered, transfuse prn Disposition - pending  Subjective:    Patient seen and examined bedside. Had HD earlier, tolerated. When discussing with patient, she is confused and agitated. She reports that she does not recall having dialysis this morning and cannot recall what days she does dialysis. She reports that "they are forcing things on me" but unable to clarify   Objective:   BP (!) 157/63 (BP Location: Left Arm)   Pulse 88   Temp 98.6 F (37 C) (Oral)   Resp 14   Ht '5\' 7"'$  (1.702 m)   Wt 70.3 kg   SpO2 100%   BMI 24.27 kg/m   Physical Exam: Gen: NAD CVS: RRR Resp: normal WOB Abd: soft Ext: no LE edema Neuro: awake, alert ACCESS: R AVF + T/B  Labs: BMET Recent Labs  Lab 06/09/22 2000 06/10/22 0353 06/11/22 0345 06/14/22 0414  NA 139 139 137 136  K 3.5 3.9 3.7 4.1  CL 99 101 97* 95*  CO2 '28 25 28 26  '$ GLUCOSE 187* 184* 210* 205*  BUN 28* 31* 17 32*  CREATININE 7.43* 7.73* 4.54* 6.96*  CALCIUM 8.3* 7.9* 7.7* 7.7*  PHOS  --   --  2.2*  --    CBC Recent Labs  Lab 06/11/22 0345 06/11/22 2019 06/12/22 0327 06/13/22 0157 06/14/22 0414  WBC 12.6* 12.8* 13.9* 12.1* 14.5*  NEUTROABS 10.2*  --  11.2* 9.0* 11.1*  HGB 8.0* 8.4* 8.1* 8.3* 7.6*  HCT 24.2* 24.8* 24.1* 24.9* 21.5*  MCV 95.3 94.3 93.4 96.5 92.3  PLT 125* 120* 132* 132* 122*      Medications:     aspirin  650  mg Oral Daily   Chlorhexidine Gluconate Cloth  6 each Topical Q0600   dextromethorphan-guaiFENesin  1 tablet Oral BID   hydrocerin   Topical BID   insulin aspart  0-15 Units Subcutaneous TID WC   insulin aspart  0-5 Units Subcutaneous QHS   labetalol  200 mg Oral BID   pantoprazole  40 mg Oral Daily   sevelamer carbonate  800 mg Oral BID AC   vancomycin variable dose per unstable renal function (pharmacist dosing)   Does not apply See admin instructions

## 2022-06-14 NOTE — Progress Notes (Signed)
Received patient in bed to unit.  Alert and oriented.  Informed consent signed and in chart.   Treatment initiated: 07:50 Treatment completed: 10:58  Patient tolerated well.  Transported back to the room  Alert, without acute distress.  Hand-off given to patient's nurse.   Access used: Right AVF Access issues: none  Total UF removed: 3L Medication(s) given: none     06/14/22 1058  Vitals  Temp 98.2 F (36.8 C)  Temp Source Oral  BP (!) 139/56  MAP (mmHg) 80  BP Location Left Arm  BP Method Automatic  Patient Position (if appropriate) Lying  Pulse Rate 83  Pulse Rate Source Monitor  ECG Heart Rate 86  Resp 15  Oxygen Therapy  SpO2 99 %  O2 Device Room Air  Patient Activity (if Appropriate) In bed  Pulse Oximetry Type Continuous  During Treatment Monitoring  HD Safety Checks Performed Yes  Intra-Hemodialysis Comments Tx completed;Tolerated well  Post Treatment  Dialyzer Clearance Lightly streaked  Duration of HD Treatment -hour(s) 3 hour(s)  Hemodialysis Intake (mL) 0 mL  Liters Processed 72  Fluid Removed (mL) 3000 mL  Tolerated HD Treatment Yes  Post-Hemodialysis Comments Tx completed. Pt tolerated well. No complications. Goal met.  AVG/AVF Arterial Site Held (minutes) 10 minutes  AVG/AVF Venous Site Held (minutes) 10 minutes  Note  Observations TX completed  Fistula / Graft Right Upper arm Arteriovenous fistula  Placement Date/Time: 01/26/14 0810   Orientation: Right  Access Location: Upper arm  Access Type: Arteriovenous fistula  Fistula / Graft Assessment Present;Thrill;Bruit  Status Deaccessed  Drainage Description None      Raistlin Gum S [facility]

## 2022-06-14 NOTE — Progress Notes (Signed)
Pharmacy Antibiotic Note  [name] is a 81 y.o. female admitted on 06/09/2022 with pneumonia.  Pharmacy has been consulted for vancomycin and cefepime dosing.  HD schedules are off d/t holidays and HD census. Patient is typically a MWF, but Friday HD census was too high. With holiday would be pushed up to Sunday (tomorrow 12/31) since closed on Monday.  Plan: Cefepime 1 gram IV every 24 hours (doses to be given after HD on HD days)  Vancomycin 750 mg IV x 1 today post HD  F/u next HD since received today and Holiday Monday Monitor clinical progress, cultures/sensitivities, renal function, abx plan, Vancomycin levels as indicated    Height: '5\' 7"'$  (170.2 cm) Weight: 70.3 kg (154 lb 15.7 oz) IBW/kg (Calculated) : 61.6  Temp (24hrs), Avg:98.4 F (36.9 C), Min:98.1 F (36.7 C), Max:98.6 F (37 C)  Recent Labs  Lab 06/09/22 2000 06/10/22 0353 06/11/22 0345 06/11/22 2019 06/12/22 0327 06/13/22 0157 06/14/22 0414  WBC 16.4* 16.9* 12.6* 12.8* 13.9* 12.1* 14.5*  CREATININE 7.43* 7.73* 4.54*  --   --   --  6.96*    Estimated Creatinine Clearance: 6.1 mL/min (A) (by C-G formula based on SCr of 6.96 mg/dL (H)).    Allergies  Allergen Reactions   Ambien [Zolpidem] Other (See Comments)    Hallucinations    Reglan [Metoclopramide] Other (See Comments)    "makes me crazy"    Antimicrobials this admission: 12/25 vancomycin >>  12/25 cefepime >>   Dose adjustments this admission:  Microbiology results: 12/25 Bcx: ngF 12/25 Sputum Cx: ngF 12/26 Pleural fluid Cx: ng x 4d MRSA PCR: neg   Thank you for allowing [place] to participate in this patients care. Jens Som, PharmD 06/14/2022 11:53 AM  **Pharmacist phone directory can be found on [email or web address] listed under [place]**

## 2022-06-14 NOTE — Progress Notes (Signed)
Pharmacy Antibiotic Note  [name] is a 81 y.o. female admitted on 06/09/2022 with pneumonia.  Pharmacy has been consulted for vancomycin and zosyn dosing.  HD schedules are off d/t holidays and HD census. Patient is typically a MWF, but Friday HD census was too high. With holiday would be pushed up to Sunday (tomorrow 12/31) since closed on Monday.  Plan: Zosyn 2.25gm IV q8h Vancomycin 750 mg IV x 1 today post HD F/u next HD since received today and Holiday Monday Monitor clinical progress, cultures/sensitivities, HD tolerance/schedule Vancomycin levels as indicated    Height: '5\' 7"'$  (170.2 cm) Weight: 70.3 kg (154 lb 15.7 oz) IBW/kg (Calculated) : 61.6  Temp (24hrs), Avg:98.5 F (36.9 C), Min:98.1 F (36.7 C), Max:99.6 F (37.6 C)  Recent Labs  Lab 06/09/22 2000 06/10/22 0353 06/11/22 0345 06/11/22 2019 06/12/22 0327 06/13/22 0157 06/14/22 0414  WBC 16.4* 16.9* 12.6* 12.8* 13.9* 12.1* 14.5*  CREATININE 7.43* 7.73* 4.54*  --   --   --  6.96*     Estimated Creatinine Clearance: 6.1 mL/min (A) (by C-G formula based on SCr of 6.96 mg/dL (H)).    Allergies  Allergen Reactions   Ambien [Zolpidem] Other (See Comments)    Hallucinations    Reglan [Metoclopramide] Other (See Comments)    "makes me crazy"    Antimicrobials this admission: 12/25 vancomycin >>  12/25 cefepime >> 12/30 12/30 zosyn >>  Dose adjustments this admission:  Microbiology results: 12/25 Bcx: ngF 12/25 Sputum Cx: ngF 12/26 Pleural fluid Cx: ng x 4d MRSA PCR: neg   Thank you for allowing [place] to participate in this patients care.  Sherlon Handing, PharmD, BCPS Please see amion for complete clinical pharmacist phone list 06/14/2022 6:04 PM
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
## 2022-06-15 DIAGNOSIS — E1129 Type 2 diabetes mellitus with other diabetic kidney complication: Secondary | ICD-10-CM | POA: Diagnosis not present

## 2022-06-15 DIAGNOSIS — J90 Pleural effusion, not elsewhere classified: Secondary | ICD-10-CM | POA: Diagnosis not present

## 2022-06-15 DIAGNOSIS — J189 Pneumonia, unspecified organism: Secondary | ICD-10-CM | POA: Diagnosis not present

## 2022-06-15 DIAGNOSIS — Z992 Dependence on renal dialysis: Secondary | ICD-10-CM | POA: Diagnosis not present

## 2022-06-15 DIAGNOSIS — N186 End stage renal disease: Secondary | ICD-10-CM | POA: Diagnosis not present

## 2022-06-15 LAB — IRON AND TIBC
Iron: 31 ug/dL (ref 28–170)
Saturation Ratios: 23 % (ref 10.4–31.8)
TIBC: 136 ug/dL — ABNORMAL LOW (ref 250–450)
UIBC: 105 ug/dL

## 2022-06-15 LAB — CULTURE, BODY FLUID W GRAM STAIN -BOTTLE: Culture: NO GROWTH

## 2022-06-15 LAB — CBC WITH DIFFERENTIAL/PLATELET
Abs Immature Granulocytes: 0.1 10*3/uL — ABNORMAL HIGH (ref 0.00–0.07)
Basophils Absolute: 0 10*3/uL (ref 0.0–0.1)
Basophils Relative: 0 %
Eosinophils Absolute: 0 10*3/uL (ref 0.0–0.5)
Eosinophils Relative: 0 %
HCT: 25.5 % — ABNORMAL LOW (ref 36.0–46.0)
Hemoglobin: 8.6 g/dL — ABNORMAL LOW (ref 12.0–15.0)
Immature Granulocytes: 1 %
Lymphocytes Relative: 6 %
Lymphs Abs: 0.9 10*3/uL (ref 0.7–4.0)
MCH: 31.4 pg (ref 26.0–34.0)
MCHC: 33.7 g/dL (ref 30.0–36.0)
MCV: 93.1 fL (ref 80.0–100.0)
Monocytes Absolute: 2.3 10*3/uL — ABNORMAL HIGH (ref 0.1–1.0)
Monocytes Relative: 14 %
Neutro Abs: 12.5 10*3/uL — ABNORMAL HIGH (ref 1.7–7.7)
Neutrophils Relative %: 79 %
Platelets: 136 10*3/uL — ABNORMAL LOW (ref 150–400)
RBC: 2.74 MIL/uL — ABNORMAL LOW (ref 3.87–5.11)
RDW: 17.4 % — ABNORMAL HIGH (ref 11.5–15.5)
WBC: 15.7 10*3/uL — ABNORMAL HIGH (ref 4.0–10.5)
nRBC: 0 % (ref 0.0–0.2)

## 2022-06-15 LAB — RENAL FUNCTION PANEL
Albumin: 2.1 g/dL — ABNORMAL LOW (ref 3.5–5.0)
Anion gap: 14 (ref 5–15)
BUN: 26 mg/dL — ABNORMAL HIGH (ref 8–23)
CO2: 25 mmol/L (ref 22–32)
Calcium: 7.7 mg/dL — ABNORMAL LOW (ref 8.9–10.3)
Chloride: 95 mmol/L — ABNORMAL LOW (ref 98–111)
Creatinine, Ser: 6.02 mg/dL — ABNORMAL HIGH (ref 0.44–1.00)
GFR, Estimated: 7 mL/min — ABNORMAL LOW (ref >60)
Glucose, Bld: 116 mg/dL — ABNORMAL HIGH (ref 70–99)
Phosphorus: 3.1 mg/dL (ref 2.5–4.6)
Potassium: 4.5 mmol/L (ref 3.5–5.1)
Sodium: 134 mmol/L — ABNORMAL LOW (ref 135–145)

## 2022-06-15 LAB — GLUCOSE, CAPILLARY
Glucose-Capillary: 113 mg/dL — ABNORMAL HIGH (ref 70–99)
Glucose-Capillary: 133 mg/dL — ABNORMAL HIGH (ref 70–99)
Glucose-Capillary: 170 mg/dL — ABNORMAL HIGH (ref 70–99)
Glucose-Capillary: 77 mg/dL (ref 70–99)

## 2022-06-15 LAB — MAGNESIUM: Magnesium: 1.8 mg/dL (ref 1.7–2.4)

## 2022-06-15 LAB — FERRITIN: Ferritin: 1418 ng/mL — ABNORMAL HIGH (ref 11–307)

## 2022-06-15 MED ORDER — VANCOMYCIN HCL 750 MG/150ML IV SOLN
750.0000 mg | Freq: Once | INTRAVENOUS | Status: AC
  Administered 2022-06-15: 750 mg via INTRAVENOUS
  Filled 2022-06-15: qty 150

## 2022-06-15 MED ORDER — LORAZEPAM 2 MG/ML IJ SOLN
1.0000 mg | Freq: Once | INTRAMUSCULAR | Status: AC
  Administered 2022-06-15: 1 mg via INTRAVENOUS
  Filled 2022-06-15: qty 1

## 2022-06-15 MED ORDER — QUETIAPINE FUMARATE 25 MG PO TABS
12.5000 mg | ORAL_TABLET | Freq: Every day | ORAL | Status: DC
  Administered 2022-06-16 – 2022-06-30 (×14): 12.5 mg via ORAL
  Filled 2022-06-15 (×16): qty 1

## 2022-06-15 MED ORDER — DARBEPOETIN ALFA 60 MCG/0.3ML IJ SOSY
60.0000 ug | PREFILLED_SYRINGE | INTRAMUSCULAR | Status: DC
  Administered 2022-06-16: 60 ug via SUBCUTANEOUS
  Filled 2022-06-15: qty 0.3

## 2022-06-15 NOTE — Hospital Course (Addendum)
81 y.o. female with a history of ESRD, diabetes mellitus type 2, GERD, hypertension, stroke-. Patient presented secondary to shortness of breath and found to have recurrent pneumonia with associated left sided pleural effusion. Empiric antibiotics initiated and patient transferred to [facility] for consideration of chest tube placement. During admission, patient developed hospital delirium.  Patient was seen by nephrology continue on hemodialysis.  IR consulted underwent left 12 French pleural drain placed by IR 1/3, followed by PCCM, s/p thrombolytic instillation on 1/4. Patient continued chest tube in place.  1/7 bloody drainage noted from chest tube Chest tube removed 1/8 evening and did well since then doing well on room air.  Seen by nephrology for ongoing dialysis per schedule. Having few episodes of hypoglycemia insulin completely discontinued oral diet encouraged oral intake.  Blood pressure was low> Lipitor discontinued placed on midodrine x few days> changed to as needed 06/29/22. Remains medically stable pending placement

## 2022-06-15 NOTE — Progress Notes (Signed)
[name] Progress Note   Assessment/ Plan:   Dialyzes at [facility] MWF  4 hours  EDW 75.5. HD Bath 2K, Dialyzer unknown, Heparin no. Access AVF right-  has post bleeding a lot.  Calcitriol 1 mcg q tx   Assessment/Plan:   SOB - markedly improved after HD.  off of supplemental oxygen.  Has L sided loculated pleural effusion, s/p thora 100 mL off. CTS following ESRD - On schedule MWF, HD back on sched tomorrow (holiday sched) HCAP- on vanc/ cefepime HTN / volume - elevated initially but improved.  Will UF as tolerated.  DM type 2 - per primary Anemia- esa to start 12/31, transfuse prn Delirium/agitation- thought to be secondary to cefepime which ahs been d/c'd. CTH without acute changes. Per primary  Disposition - pending  Subjective:    Patient seen and examined bedside. Open for HD today. Remains delirious. When asked how she is doing she just shakes her head 'no' and does not elaborate further and looks away. Not communicating with me today.   Objective:   BP (!) 101/51   Pulse 65   Temp 97.8 F (36.6 C) (Oral)   Resp 16   Ht '5\' 7"'$  (1.702 m)   Wt 71.7 kg   SpO2 96%   BMI 24.76 kg/m   Physical Exam: Gen: NAD CVS: RRR Resp: normal WOB Abd: soft Ext: no LE edema Neuro: awake, alert ACCESS: R AVF + T/B  Labs: BMET Recent Labs  Lab 06/09/22 2000 06/10/22 0353 06/11/22 0345 06/14/22 0414  NA 139 139 137 136  K 3.5 3.9 3.7 4.1  CL 99 101 97* 95*  CO2 '28 25 28 26  '$ GLUCOSE 187* 184* 210* 205*  BUN 28* 31* 17 32*  CREATININE 7.43* 7.73* 4.54* 6.96*  CALCIUM 8.3* 7.9* 7.7* 7.7*  PHOS  --   --  2.2*  --    CBC Recent Labs  Lab 06/12/22 0327 06/13/22 0157 06/14/22 0414 06/15/22 0331  WBC 13.9* 12.1* 14.5* 15.7*  NEUTROABS 11.2* 9.0* 11.1* 12.5*  HGB 8.1* 8.3* 7.6* 8.6*  HCT 24.1* 24.9* 21.5* 25.5*  MCV 93.4 96.5 92.3 93.1  PLT 132* 132* 122* 136*      Medications:     aspirin  650 mg Oral Daily   Chlorhexidine Gluconate  Cloth  6 each Topical Q0600   dextromethorphan-guaiFENesin  1 tablet Oral BID   hydrocerin   Topical BID   insulin aspart  0-15 Units Subcutaneous TID WC   insulin aspart  0-5 Units Subcutaneous QHS   labetalol  200 mg Oral BID   pantoprazole  40 mg Oral Daily   QUEtiapine  25 mg Oral QHS   sevelamer carbonate  800 mg Oral BID AC   vancomycin variable dose per unstable renal function (pharmacist dosing)   Does not apply See admin instructions

## 2022-06-15 NOTE — Progress Notes (Signed)
PROGRESS NOTE    ALISABETH SELKIRK  HUD:[id number] DOB: [birth year] DOA: 06/09/2022 PCP: [name], MD   Brief Narrative: [name] is a 81 y.o. female with a history of ESRD, diabetes mellitus type 2, GERD, hypertension, stroke. Patient presented secondary to shortness of breath and found to have recurrent pneumonia with associated left sided pleural effusion. Empiric antibiotics initiated and patient transferred to [facility] for consideration of chest tube placement. During admission, patient developed hospital delirium.   Assessment and Plan: * Pleural effusion Concern for parapneumonic effusion. IR consulted and performed thoracentesis, yielding 1.2 L of serosanguinous fluid, followed by repeat thoracentesis with minimal amount of serosanguinous fluid and likely clot. Patient transferred to [facility] for pulmonology consult and consideration of chest tube. Concern for hemothorax. CT surgery has recommended CT guided chest tube. IR states chest tube cannot be placed until 06/18/2022. Patient remains on room air.  Delirium In setting of history of multiple vascular injuries and current hospitalization. Likely hospital related delirium. Patient is also on Cefepime which could also be the culprit. Delirium improved today. CT head unremarkable for acute process. -Delirium precautions -Decrease to Seroquel 12.5 mg qHS to help with sleep  CAP (community acquired pneumonia) Patient recently treated with Unasyn and azithromycin, prior to admission. On admission, patient was broadened to Vancomycin and Cefepime. Procalcitonin mildly elevated at 0.96. Afebrile with leukocytosis. Complicated by left pleural effusion. MRSA PCR negative. -Continue Vancomycin and Zosyn  GERD (gastroesophageal reflux disease) -Continue Protonix  DMII (diabetes mellitus, type 2) (HCC) -Continue SSI  ESRD (end stage renal disease) [facility]) Nephrology consulted for hemodialysis while  inpatient.  Thrombocytopenia (HCC) Mild and likely reactive, secondary to infection. Stable.  Essential hypertension -Continue labetalol  Chest pain-resolved as of 06/13/2022 Likely secondary to pulmonary edema. Resolved.  Severe uncontrolled hypertension-resolved as of 06/13/2022 Patient with blood pressure up to 214/94, treated with Hydralazine IV and hemodialysis. Resolved.    DVT prophylaxis: Heparin subq Code Status:   Code Status: Full Code Family Communication: Called three daughters via telephone with no answer Disposition Plan: Discharge home pending continued specialist recommendations   Consultants:  Pulmonology Cardiothoracic surgery  Procedures:  12/28: Left thoracentesis  Antimicrobials: Vancomycin Cefepime  Zosyn IV   Subjective: No concerns this morning. Feels well.  Objective: BP (!) 101/51   Pulse 65   Temp 97.8 F (36.6 C) (Oral)   Resp 16   Ht '5\' 7"'$  (1.702 m)   Wt 71.7 kg   SpO2 96%   BMI 24.76 kg/m   Examination:  General exam: Appears calm and comfortable Respiratory system: Diminished. Respiratory effort normal. Cardiovascular system: S1 & S2 heard, RRR. Gastrointestinal system: Abdomen is nondistended, soft and nontender. Normal bowel sounds heard. Central nervous system: Alert and oriented to person and place. Musculoskeletal: No calf tenderness    Data Reviewed: I have personally reviewed following labs and imaging studies  CBC Lab Results  Component Value Date   WBC 15.7 (H) 06/15/2022   RBC 2.74 (L) 06/15/2022   HGB 8.6 (L) 06/15/2022   HCT 25.5 (L) 06/15/2022   MCV 93.1 06/15/2022   MCH 31.4 06/15/2022   PLT 136 (L) 06/15/2022   MCHC 33.7 06/15/2022   RDW 17.4 (H) 06/15/2022   LYMPHSABS 0.9 06/15/2022   MONOABS 2.3 (H) 06/15/2022   EOSABS 0.0 06/15/2022   BASOSABS 0.0 85/88/5027     Last metabolic panel Lab Results  Component Value Date   NA 136 06/14/2022   K 4.1  06/14/2022   CL 95 (L) 06/14/2022    CO2 26 06/14/2022   BUN 32 (H) 06/14/2022   CREATININE 6.96 (H) 06/14/2022   GLUCOSE 205 (H) 06/14/2022   GFRNONAA 5 (L) 06/14/2022   GFRAA 5 (L) 01/16/2020   CALCIUM 7.7 (L) 06/14/2022   PHOS 2.2 (L) 06/11/2022   PROT 6.4 (L) 06/10/2022   ALBUMIN 2.5 (L) 06/11/2022   BILITOT 0.7 06/10/2022   ALKPHOS 58 06/10/2022   AST 17 06/10/2022   ALT 13 06/10/2022   ANIONGAP 15 06/14/2022    GFR: Estimated Creatinine Clearance: 6.1 mL/min (A) (by C-G formula based on SCr of 6.96 mg/dL (H)).  Recent Results (from the past 240 hour(s))  Expectorated Sputum Assessment w Gram Stain, Rflx to Resp Cult     Status: None   Collection Time: 06/09/22 12:04 AM   Specimen: Expectorated Sputum  Result Value Ref Range Status   Specimen Description EXPECTORATED SPUTUM  Final   Special Requests NONE  Final   Sputum evaluation   Final    Sputum specimen not acceptable for testing.  Please recollect.   CALL HARDIN,S. ON 06/10/22 '@[id number]'$  BY [facility] Performed at [facility], [address] [place], [place] [address]    Report Status 06/10/2022 FINAL  Final  Resp panel by RT-PCR (RSV, Flu A&B, Covid) Anterior Nasal Swab     Status: None   Collection Time: 06/09/22 10:17 PM   Specimen: Anterior Nasal Swab  Result Value Ref Range Status   SARS Coronavirus 2 by RT PCR NEGATIVE NEGATIVE Final    Comment: (NOTE) SARS-CoV-2 target nucleic acids are NOT DETECTED.  The SARS-CoV-2 RNA is generally detectable in upper respiratory specimens during the acute phase of infection. The lowest concentration of SARS-CoV-2 viral copies this assay can detect is 138 copies/mL. A negative result does not preclude SARS-Cov-2 infection and should not be used as the sole basis for treatment or other patient management decisions. A negative result may occur with  improper specimen collection/handling, submission of specimen other than nasopharyngeal swab, presence of viral mutation(s) within the areas targeted by this assay,  and inadequate number of viral copies(<138 copies/mL). A negative result must be combined with clinical observations, patient history, and epidemiological information. The expected result is Negative.  Fact Sheet for Patients:  EntrepreneurPulse.com.au  Fact Sheet for Healthcare Providers:  IncredibleEmployment.be  This test is no t yet approved or cleared by the [place] FDA and  has been authorized for detection and/or diagnosis of SARS-CoV-2 by FDA under an Emergency Use Authorization (EUA). This EUA will remain  in effect (meaning this test can be used) for the duration of the COVID-19 declaration under Section 564(b)(1) of the Act, 21 U.S.C.section 360bbb-3(b)(1), unless the authorization is terminated  or revoked sooner.       Influenza A by PCR NEGATIVE NEGATIVE Final   Influenza B by PCR NEGATIVE NEGATIVE Final    Comment: (NOTE) The Xpert Xpress SARS-CoV-2/FLU/RSV plus assay is intended as an aid in the diagnosis of influenza from Nasopharyngeal swab specimens and should not be used as a sole basis for treatment. Nasal washings and aspirates are unacceptable for Xpert Xpress SARS-CoV-2/FLU/RSV testing.  Fact Sheet for Patients: EntrepreneurPulse.com.au  Fact Sheet for Healthcare Providers: IncredibleEmployment.be  This test is not yet approved or cleared by the [place] FDA and has been authorized for detection and/or diagnosis of SARS-CoV-2 by FDA under an Emergency Use Authorization (EUA). This EUA will remain in effect (meaning this test can be  used) for the duration of the COVID-19 declaration under Section 564(b)(1) of the Act, 21 U.S.C. section 360bbb-3(b)(1), unless the authorization is terminated or revoked.     Resp Syncytial Virus by PCR NEGATIVE NEGATIVE Final    Comment: (NOTE) Fact Sheet for Patients: EntrepreneurPulse.com.au  Fact Sheet for  Healthcare Providers: IncredibleEmployment.be  This test is not yet approved or cleared by the [place] FDA and has been authorized for detection and/or diagnosis of SARS-CoV-2 by FDA under an Emergency Use Authorization (EUA). This EUA will remain in effect (meaning this test can be used) for the duration of the COVID-19 declaration under Section 564(b)(1) of the Act, 21 U.S.C. section 360bbb-3(b)(1), unless the authorization is terminated or revoked.  Performed at [facility], [address], [place], [place] [address]   Culture, blood (routine x 2) Call MD if unable to obtain prior to antibiotics being given     Status: None   Collection Time: 06/09/22 10:48 PM   Specimen: BLOOD LEFT ARM  Result Value Ref Range Status   Specimen Description BLOOD LEFT ARM BOTTLES DRAWN AEROBIC AND ANAEROBIC  Final   Special Requests Blood Culture adequate volume  Final   Culture   Final    NO GROWTH 5 DAYS Performed at [facility], [address] [place], [place] [address]    Report Status 06/14/2022 FINAL  Final  Culture, blood (routine x 2) Call MD if unable to obtain prior to antibiotics being given     Status: None   Collection Time: 06/09/22 10:49 PM   Specimen: Site Not Specified; Blood  Result Value Ref Range Status   Specimen Description   Final    SITE NOT SPECIFIED BOTTLES DRAWN AEROBIC AND ANAEROBIC   Special Requests Blood Culture adequate volume  Final   Culture   Final    NO GROWTH 5 DAYS Performed at [facility], [address] [place], [place] [address]    Report Status 06/14/2022 FINAL  Final  Culture, body fluid w Gram Stain-bottle     Status: None   Collection Time: 06/10/22 11:35 AM   Specimen: Pleura  Result Value Ref Range Status   Specimen Description PLEURAL  Final   Special Requests BOTTLES DRAWN AEROBIC AND ANAEROBIC 10CC  Final   Culture   Final    NO GROWTH 5 DAYS Performed at [facility], [address], [place], [place] [address]     Report Status 06/15/2022 FINAL  Final  Gram stain     Status: None   Collection Time: 06/10/22 11:35 AM   Specimen: Pleura  Result Value Ref Range Status   Specimen Description PLEURAL  Final   Special Requests BOTTLES DRAWN AEROBIC AND ANAEROBIC 10 CC  Final   Gram Stain   Final    NO ORGANISMS SEEN WBC PRESENT,BOTH PMN AND MONONUCLEAR CYTOSPIN SMEAR Performed at [facility], [address] [place], [place] [address]    Report Status 06/10/2022 FINAL  Final  MRSA Next Gen by PCR, Nasal     Status: None   Collection Time: 06/10/22  5:00 PM   Specimen: Nasal Mucosa; Nasal Swab  Result Value Ref Range Status   MRSA by PCR Next Gen NOT DETECTED NOT DETECTED Final    Comment: (NOTE) The GeneXpert MRSA Assay (FDA approved for NASAL specimens only), is one component of a comprehensive MRSA colonization surveillance program. It is not intended to diagnose MRSA infection nor to guide or monitor treatment for MRSA infections. Test performance is not  FDA approved in patients less than 47 years old. Performed at [facility], [address], [place], [place] [address]       Radiology Studies: CT HEAD WO CONTRAST (5MM)  Result Date: 06/15/2022 CLINICAL DATA:  Altered mental status EXAM: CT HEAD WITHOUT CONTRAST TECHNIQUE: Contiguous axial images were obtained from the base of the skull through the vertex without intravenous contrast. RADIATION DOSE REDUCTION: This exam was performed according to the departmental dose-optimization program which includes automated exposure control, adjustment of the mA and/or kV according to patient size and/or use of iterative reconstruction technique. COMPARISON:  11/20/2021 FINDINGS: Brain: No mass, hemorrhage or extra-axial collection. There is bilateral basal ganglia mineralization. There is periventricular hypoattenuation compatible with chronic microvascular disease. Mild volume loss for age. Vascular: Calcific atherosclerosis of the ICAs at the skull  base. Skull: Negative Sinuses/Orbits: No acute finding. Other: None. IMPRESSION: No acute intracranial abnormality. Chronic ischemic microangiopathy. Electronically Signed   By: Ulyses Jarred M.D.   On: 06/15/2022 01:49   CT CHEST WO CONTRAST  Result Date: 06/13/2022 CLINICAL DATA:  Pleural effusion, empyema EXAM: CT CHEST WITHOUT CONTRAST TECHNIQUE: Multidetector CT imaging of the chest was performed following the standard protocol without IV contrast. RADIATION DOSE REDUCTION: This exam was performed according to the departmental dose-optimization program which includes automated exposure control, adjustment of the mA and/or kV according to patient size and/or use of iterative reconstruction technique. COMPARISON:  Previous studies including chest radiographs done on 06/12/2022 and CT done on 06/01/2022 FINDINGS: Cardiovascular: Heart is enlarged in size. Coronary artery calcifications are seen. Small pericardial effusion is present.There is a vascular stent in the right upper arm. Mediastinum/Nodes: Slightly enlarged lymph nodes in mediastinum appear stable. Lungs/Pleura: There is interval increase in left pleural effusion. There is moderate to large left pleural effusion. Part of the effusion appears to be loculated along the lateral aspect of left mid lung field and within interlobar fissure. Lobulations are seen in the effusion in the posterior aspect of left lower lung field. There is dense infiltrate in left upper lobe. There is interval decrease in area of left upper lobe infiltrate. There is interval decrease in infiltrates in right middle lobe. There is interval decrease in amount of right pleural effusion. There are increased markings in both lower lobes, more so on the left side suggesting atelectasis/pneumonia. There is no pneumothorax. Upper Abdomen: Arterial calcifications are seen in aorta and its major branches. Musculoskeletal: No acute findings are seen. IMPRESSION: There is interval  increase in amount of left pleural effusion. There is moderate to large left pleural effusion in the current study. Part of the effusion appears to be loculated within interlobar fissure and along the lateral and posterior aspect of left mid and left lower lung fields. There is no significant thickening of pleura. There are no pockets of air in the pleural effusion. However, possibility of empyema in the left pleural space is not excluded. There is no pneumothorax. There is interval decrease in area of infiltrate in left upper lobe with a dense residual infiltrate in the current study. There is interval decrease in infiltrates in right middle lobe. There is interval worsening of infiltrate in left lower lobe suggesting atelectasis/pneumonia. Cardiomegaly. Coronary artery disease. Small pericardial effusion. Aortic arteriosclerosis. Electronically Signed   By: Elmer Picker M.D.   On: 06/13/2022 19:43      LOS: 6 days    Cordelia Poche, MD [facility] 06/15/2022, 1:49 PM   If 7PM-7AM, please contact night-coverage [email or web address]

## 2022-06-15 NOTE — Plan of Care (Signed)
  Problem: Clinical Measurements: Goal: Respiratory complications will improve Outcome: Progressing Goal: Cardiovascular complication will be avoided Outcome: Progressing   

## 2022-06-16 DIAGNOSIS — Z794 Long term (current) use of insulin: Secondary | ICD-10-CM | POA: Diagnosis not present

## 2022-06-16 DIAGNOSIS — E1129 Type 2 diabetes mellitus with other diabetic kidney complication: Secondary | ICD-10-CM | POA: Diagnosis not present

## 2022-06-16 DIAGNOSIS — J90 Pleural effusion, not elsewhere classified: Secondary | ICD-10-CM | POA: Diagnosis not present

## 2022-06-16 DIAGNOSIS — J189 Pneumonia, unspecified organism: Secondary | ICD-10-CM | POA: Diagnosis not present

## 2022-06-16 DIAGNOSIS — N186 End stage renal disease: Secondary | ICD-10-CM | POA: Diagnosis not present

## 2022-06-16 DIAGNOSIS — R5383 Other fatigue: Secondary | ICD-10-CM

## 2022-06-16 DIAGNOSIS — E1122 Type 2 diabetes mellitus with diabetic chronic kidney disease: Secondary | ICD-10-CM | POA: Diagnosis not present

## 2022-06-16 LAB — GLUCOSE, CAPILLARY
Glucose-Capillary: 125 mg/dL — ABNORMAL HIGH (ref 70–99)
Glucose-Capillary: 108 mg/dL — ABNORMAL HIGH (ref 70–99)
Glucose-Capillary: 105 mg/dL — ABNORMAL HIGH (ref 70–99)
Glucose-Capillary: 175 mg/dL — ABNORMAL HIGH (ref 70–99)
Glucose-Capillary: 96 mg/dL (ref 70–99)

## 2022-06-16 LAB — VANCOMYCIN, RANDOM: Vancomycin Rm: 33 ug/mL

## 2022-06-16 NOTE — Plan of Care (Signed)
  Problem: Activity: Goal: Ability to tolerate increased activity will improve Outcome: Progressing   Problem: Clinical Measurements: Goal: Ability to maintain a body temperature in the normal range will improve Outcome: Progressing   Problem: Respiratory: Goal: Ability to maintain adequate ventilation will improve Outcome: Progressing Goal: Ability to maintain a clear airway will improve Outcome: Progressing   Problem: Education: Goal: Knowledge of General Education information will improve Description: Including pain rating scale, medication(s)/side effects and non-pharmacologic comfort measures Outcome: Progressing   Problem: Health Behavior/Discharge Planning: Goal: Ability to manage health-related needs will improve Outcome: Progressing   Problem: Clinical Measurements: Goal: Ability to maintain clinical measurements within normal limits will improve Outcome: Progressing Goal: Will remain free from infection Outcome: Progressing Goal: Diagnostic test results will improve Outcome: Progressing Goal: Respiratory complications will improve Outcome: Progressing Goal: Cardiovascular complication will be avoided Outcome: Progressing   Problem: Activity: Goal: Risk for activity intolerance will decrease Outcome: Progressing   Problem: Nutrition: Goal: Adequate nutrition will be maintained Outcome: Progressing   Problem: Coping: Goal: Level of anxiety will decrease Outcome: Progressing   Problem: Elimination: Goal: Will not experience complications related to bowel motility Outcome: Progressing Goal: Will not experience complications related to urinary retention Outcome: Progressing   Problem: Pain Managment: Goal: General experience of comfort will improve Outcome: Progressing   Problem: Safety: Goal: Ability to remain free from injury will improve Outcome: Progressing   Problem: Skin Integrity: Goal: Risk for impaired skin integrity will decrease Outcome:  Progressing   Problem: Education: Goal: Ability to describe self-care measures that may prevent or decrease complications (Diabetes Survival Skills Education) will improve Outcome: Progressing Goal: Individualized Educational Video(s) Outcome: Progressing   Problem: Coping: Goal: Ability to adjust to condition or change in health will improve Outcome: Progressing   Problem: Fluid Volume: Goal: Ability to maintain a balanced intake and output will improve Outcome: Progressing   Problem: Health Behavior/Discharge Planning: Goal: Ability to identify and utilize available resources and services will improve Outcome: Progressing Goal: Ability to manage health-related needs will improve Outcome: Progressing   Problem: Metabolic: Goal: Ability to maintain appropriate glucose levels will improve Outcome: Progressing   Problem: Nutritional: Goal: Maintenance of adequate nutrition will improve Outcome: Progressing Goal: Progress toward achieving an optimal weight will improve Outcome: Progressing   Problem: Skin Integrity: Goal: Risk for impaired skin integrity will decrease Outcome: Progressing   Problem: Tissue Perfusion: Goal: Adequacy of tissue perfusion will improve Outcome: Progressing   

## 2022-06-16 NOTE — Assessment & Plan Note (Signed)
Likely secondary to Ativan given night prior to late session of HD. -Avoid benzodiazepines

## 2022-06-16 NOTE — Progress Notes (Signed)
[facility] NEPHROLOGY PROGRESS NOTE  Assessment/ Plan: Dialyzes at [facility] MWF  4 hours  EDW 75.5. HD Bath 2K, Dialyzer unknown, Heparin no. Access AVF right-  has post bleeding a lot.  Calcitriol 1 mcg q tx   Assessment/Plan:   # SOB/pleural effusion- markedly improved after HD.  off of supplemental oxygen.  Has L sided loculated pleural effusion, s/p thora 100 mL off. CTS following, plan for chest tube placement noted.  #ESRD - On schedule MWF, status post HD yesterday in the holiday schedule with 1.5 L.  Plan for next HD on 1/3.    # HCAP- on vanc/ cefepime.  #HTN / volume: BP improved on labetalol.  Volume management with HD.  # Anemia- esa to start 12/31, transfuse prn.  #Delirium/agitation- thought to be secondary to cefepime which ahs been d/c'd. CTH without acute changes. Per primary.  Subjective: Seen and examined at bedside.  Denies nausea, vomiting, chest pain, shortness of breath.  HD with 1.5 L ultrafiltration yesterday. Objective Vital signs in last 24 hours: Vitals:   06/15/22 2238 06/15/22 2337 06/16/22 0508 06/16/22 1002  BP:  (!) 125/55 (!) 122/49 (!) 105/43  Pulse:  87 86 77  Resp:  '18 18 19  '$ Temp:  98.7 F (37.1 C) 98 F (36.7 C) 98.3 F (36.8 C)  TempSrc:  Oral Oral Oral  SpO2:  99% 98% 100%  Weight: 68.8 kg     Height:       Weight change: -4.2 kg  Intake/Output Summary (Last 24 hours) at 06/16/2022 1357 Last data filed at 06/16/2022 0647 Gross per 24 hour  Intake 390.57 ml  Output 1500 ml  Net -1109.43 ml       Labs: RENAL PANEL Recent Labs    05/01/22 2235 05/09/22 0659 06/01/22 1910 06/02/22 0740 06/03/22 0329 06/04/22 0344 06/09/22 2000 06/10/22 0353 06/11/22 0345 06/12/22 0327 06/13/22 0157 06/14/22 0414 06/15/22 0331 06/15/22 1811  NA 140 138 136 137 136 136 139 139 137  --   --  136  --  134*  K 3.7 3.5 3.5 4.3 3.0* 3.2* 3.5 3.9 3.7  --   --  4.1  --  4.5  CL 100 96* 96* 96* 95* 96* 99 101 97*   --   --  95*  --  95*  CO'2 26 27 28 28 28 28 28 25 28 '$  --   --  26  --  25  GLUCOSE 275* 371* 185* 220* 132* 146* 187* 184* 210*  --   --  205*  --  116*  BUN 27* 38* 36* 41* 21 34* 28* 31* 17  --   --  32*  --  26*  CREATININE 7.01* 6.70* 7.26* 8.11* 4.68* 6.42* 7.43* 7.73* 4.54*  --   --  6.96*  --  6.02*  CALCIUM 8.1* 7.6* 8.2* 8.0* 7.8* 7.9* 8.3* 7.9* 7.7*  --   --  7.7*  --  7.7*  MG  --   --   --  2.2  --  1.9  --  2.1 1.7 1.7 1.6* 1.8 1.8  --   PHOS  --   --   --  4.6  --   --   --   --  2.2*  --   --   --   --  3.1  ALBUMIN 3.3*  --   --  3.0*  --   --  3.1* 2.8* 2.5*  --   --   --   --  2.1*     Liver Function Tests: Recent Labs  Lab 06/09/22 2000 06/10/22 0353 06/11/22 0345 06/15/22 1811  AST 20 17  --   --   ALT 13 13  --   --   ALKPHOS 68 58  --   --   BILITOT 0.5 0.7  --   --   PROT 7.3 6.4*  --   --   ALBUMIN 3.1* 2.8* 2.5* 2.1*   No results for input(s): "LIPASE", "AMYLASE" in the last 168 hours. No results for input(s): "AMMONIA" in the last 168 hours. CBC: Recent Labs    06/11/22 2019 06/12/22 0327 06/13/22 0157 06/14/22 0414 06/15/22 0331 06/15/22 0719  HGB 8.4* 8.1* 8.3* 7.6* 8.6*  --   MCV 94.3 93.4 96.5 92.3 93.1  --   FERRITIN  --   --   --   --  1,418*  --   TIBC  --   --   --   --   --  136*  IRON  --   --   --   --   --  31    Cardiac Enzymes: No results for input(s): "CKTOTAL", "CKMB", "CKMBINDEX", "TROPONINI" in the last 168 hours. CBG: Recent Labs  Lab 06/15/22 1603 06/15/22 2340 06/16/22 0747 06/16/22 1011 06/16/22 1132  GLUCAP 133* 77 125* 108* 105*    Iron Studies:  Recent Labs    06/15/22 0331 06/15/22 0719  IRON  --  31  TIBC  --  136*  FERRITIN 1,418*  --    Studies/Results: CT HEAD WO CONTRAST (5MM)  Result Date: 06/15/2022 CLINICAL DATA:  Altered mental status EXAM: CT HEAD WITHOUT CONTRAST TECHNIQUE: Contiguous axial images were obtained from the base of the skull through the vertex without intravenous contrast.  RADIATION DOSE REDUCTION: This exam was performed according to the departmental dose-optimization program which includes automated exposure control, adjustment of the mA and/or kV according to patient size and/or use of iterative reconstruction technique. COMPARISON:  11/20/2021 FINDINGS: Brain: No mass, hemorrhage or extra-axial collection. There is bilateral basal ganglia mineralization. There is periventricular hypoattenuation compatible with chronic microvascular disease. Mild volume loss for age. Vascular: Calcific atherosclerosis of the ICAs at the skull base. Skull: Negative Sinuses/Orbits: No acute finding. Other: None. IMPRESSION: No acute intracranial abnormality. Chronic ischemic microangiopathy. Electronically Signed   By: Ulyses Jarred M.D.   On: 06/15/2022 01:49    Medications: Infusions:  piperacillin-tazobactam (ZOSYN)  IV 2.25 g (06/16/22 1337)   [START ON 06/18/2022] vancomycin      Scheduled Medications:  aspirin  650 mg Oral Daily   Chlorhexidine Gluconate Cloth  6 each Topical Q0600   darbepoetin (ARANESP) injection - DIALYSIS  60 mcg Subcutaneous Q Sun-1800   dextromethorphan-guaiFENesin  1 tablet Oral BID   hydrocerin   Topical BID   insulin aspart  0-15 Units Subcutaneous TID WC   insulin aspart  0-5 Units Subcutaneous QHS   labetalol  200 mg Oral BID   pantoprazole  40 mg Oral Daily   QUEtiapine  12.5 mg Oral QHS   sevelamer carbonate  800 mg Oral BID AC   vancomycin variable dose per unstable renal function (pharmacist dosing)   Does not apply See admin instructions    have reviewed scheduled and prn medications.  Physical Exam: General:NAD, comfortable Heart:RRR, s1s2 nl Lungs:clear b/l, no crackle Abdomen:soft, Non-tender, non-distended Extremities:No edema Dialysis Access: Right upper extremity AV fistula has thrill and bruit present  [name] 06/16/2022,1:57 PM  LOS: 7 days

## 2022-06-16 NOTE — Progress Notes (Signed)
PROGRESS NOTE    [name]  XTG:[id number] DOB: 1940/09/23 DOA: 06/09/2022 PCP: [name], MD   Brief Narrative: [name] is a 82 y.o. female with a history of ESRD, diabetes mellitus type 2, GERD, hypertension, stroke. Patient presented secondary to shortness of breath and found to have recurrent pneumonia with associated left sided pleural effusion. Empiric antibiotics initiated and patient transferred to [facility] for consideration of chest tube placement. During admission, patient developed hospital delirium.   Assessment and Plan: * Pleural effusion Concern for parapneumonic effusion. IR consulted and performed thoracentesis, yielding 1.2 L of serosanguinous fluid, followed by repeat thoracentesis with minimal amount of serosanguinous fluid and likely clot. Patient transferred to [facility] for pulmonology consult and consideration of chest tube. Concern for hemothorax. CT surgery has recommended CT guided chest tube. IR states chest tube cannot be placed until 06/18/2022. Patient remains on room air.  Lethargy Likely secondary to Ativan given last night prior to late session of HD. -Avoid benzodiazepines  Delirium In setting of history of multiple vascular injuries and current hospitalization. Likely hospital related delirium. Patient is also on Cefepime which could also be the culprit. Delirium improved. CT head unremarkable for acute process. -Delirium precautions -Continue Seroquel 12.5 mg qHS to help with sleep  CAP (community acquired pneumonia) Patient recently treated with Unasyn and azithromycin, prior to admission. On admission, patient was broadened to Vancomycin and Cefepime. Procalcitonin mildly elevated at 0.96. Afebrile with leukocytosis. Complicated by left pleural effusion. MRSA PCR negative. -Continue Vancomycin and Zosyn  GERD (gastroesophageal reflux disease) -Continue Protonix  DMII (diabetes mellitus, type 2)  (HCC) -Continue SSI  ESRD (end stage renal disease) [facility]) Nephrology consulted for hemodialysis while inpatient.  Thrombocytopenia (HCC) Mild and likely reactive, secondary to infection. Stable.  Essential hypertension -Continue labetalol  Chest pain-resolved as of 06/13/2022 Likely secondary to pulmonary edema. Resolved.  Severe uncontrolled hypertension-resolved as of 06/13/2022 Patient with blood pressure up to 214/94, treated with Hydralazine IV and hemodialysis. Resolved.    DVT prophylaxis: Heparin subq Code Status:   Code Status: Full Code Family Communication: Daughter at bedside Disposition Plan: Discharge home pending continued specialist management/recommendations   Consultants:  Pulmonology Cardiothoracic surgery  Procedures:  12/28: Left thoracentesis  Antimicrobials: Vancomycin Cefepime  Zosyn IV   Subjective: Patient is lethargic this morning.  Objective: BP (!) 105/43 (BP Location: Left Arm)   Pulse 77   Temp 98.3 F (36.8 C) (Oral)   Resp 19   Ht '5\' 7"'$  (1.702 m)   Wt 68.8 kg   SpO2 100%   BMI 23.76 kg/m   Examination:  General exam: Appears calm and comfortable Respiratory system: Clear to auscultation. Respiratory effort normal. Cardiovascular system: S1 & S2 heard, RRR. Gastrointestinal system: Abdomen is nondistended, soft and nontender. Normal bowel sounds heard. Central nervous system: Lethargic. Responds to pain.   Data Reviewed: I have personally reviewed following labs and imaging studies  CBC Lab Results  Component Value Date   WBC 15.7 (H) 06/15/2022   RBC 2.74 (L) 06/15/2022   HGB 8.6 (L) 06/15/2022   HCT 25.5 (L) 06/15/2022   MCV 93.1 06/15/2022   MCH 31.4 06/15/2022   PLT 136 (L) 06/15/2022   MCHC 33.7 06/15/2022   RDW 17.4 (H) 06/15/2022   LYMPHSABS 0.9 06/15/2022   MONOABS 2.3 (H) 06/15/2022   EOSABS 0.0 06/15/2022   BASOSABS 0.0 27/08/5007     Last metabolic panel Lab Results  Component Value Date  NA 134 (L) 06/15/2022   K 4.5 06/15/2022   CL 95 (L) 06/15/2022   CO2 25 06/15/2022   BUN 26 (H) 06/15/2022   CREATININE 6.02 (H) 06/15/2022   GLUCOSE 116 (H) 06/15/2022   GFRNONAA 7 (L) 06/15/2022   GFRAA 5 (L) 01/16/2020   CALCIUM 7.7 (L) 06/15/2022   PHOS 3.1 06/15/2022   PROT 6.4 (L) 06/10/2022   ALBUMIN 2.1 (L) 06/15/2022   BILITOT 0.7 06/10/2022   ALKPHOS 58 06/10/2022   AST 17 06/10/2022   ALT 13 06/10/2022   ANIONGAP 14 06/15/2022    GFR: Estimated Creatinine Clearance: 7 mL/min (A) (by C-G formula based on SCr of 6.02 mg/dL (H)).  Recent Results (from the past 240 hour(s))  Expectorated Sputum Assessment w Gram Stain, Rflx to Resp Cult     Status: None   Collection Time: 06/09/22 12:04 AM   Specimen: Expectorated Sputum  Result Value Ref Range Status   Specimen Description EXPECTORATED SPUTUM  Final   Special Requests NONE  Final   Sputum evaluation   Final    Sputum specimen not acceptable for testing.  Please recollect.   CALL HARDIN,S. ON 06/10/22 '@0228'$  BY [facility] Performed at [facility], [address] [place], [place] [address]    Report Status 06/10/2022 FINAL  Final  Resp panel by RT-PCR (RSV, Flu A&B, Covid) Anterior Nasal Swab     Status: None   Collection Time: 06/09/22 10:17 PM   Specimen: Anterior Nasal Swab  Result Value Ref Range Status   SARS Coronavirus 2 by RT PCR NEGATIVE NEGATIVE Final    Comment: (NOTE) SARS-CoV-2 target nucleic acids are NOT DETECTED.  The SARS-CoV-2 RNA is generally detectable in upper respiratory specimens during the acute phase of infection. The lowest concentration of SARS-CoV-2 viral copies this assay can detect is 138 copies/mL. A negative result does not preclude SARS-Cov-2 infection and should not be used as the sole basis for treatment or other patient management decisions. A negative result may occur with  improper specimen collection/handling, submission of specimen other than nasopharyngeal swab,  presence of viral mutation(s) within the areas targeted by this assay, and inadequate number of viral copies(<138 copies/mL). A negative result must be combined with clinical observations, patient history, and epidemiological information. The expected result is Negative.  Fact Sheet for Patients:  EntrepreneurPulse.com.au  Fact Sheet for Healthcare Providers:  IncredibleEmployment.be  This test is no t yet approved or cleared by the [place] FDA and  has been authorized for detection and/or diagnosis of SARS-CoV-2 by FDA under an Emergency Use Authorization (EUA). This EUA will remain  in effect (meaning this test can be used) for the duration of the COVID-19 declaration under Section 564(b)(1) of the Act, 21 U.S.C.section 360bbb-3(b)(1), unless the authorization is terminated  or revoked sooner.       Influenza A by PCR NEGATIVE NEGATIVE Final   Influenza B by PCR NEGATIVE NEGATIVE Final    Comment: (NOTE) The Xpert Xpress SARS-CoV-2/FLU/RSV plus assay is intended as an aid in the diagnosis of influenza from Nasopharyngeal swab specimens and should not be used as a sole basis for treatment. Nasal washings and aspirates are unacceptable for Xpert Xpress SARS-CoV-2/FLU/RSV testing.  Fact Sheet for Patients: EntrepreneurPulse.com.au  Fact Sheet for Healthcare Providers: IncredibleEmployment.be  This test is not yet approved or cleared by the [place] FDA and has been authorized for detection and/or diagnosis of SARS-CoV-2 by FDA under an Emergency Use Authorization (EUA). This EUA will remain in  effect (meaning this test can be used) for the duration of the COVID-19 declaration under Section 564(b)(1) of the Act, 21 U.S.C. section 360bbb-3(b)(1), unless the authorization is terminated or revoked.     Resp Syncytial Virus by PCR NEGATIVE NEGATIVE Final    Comment: (NOTE) Fact Sheet for  Patients: EntrepreneurPulse.com.au  Fact Sheet for Healthcare Providers: IncredibleEmployment.be  This test is not yet approved or cleared by the [place] FDA and has been authorized for detection and/or diagnosis of SARS-CoV-2 by FDA under an Emergency Use Authorization (EUA). This EUA will remain in effect (meaning this test can be used) for the duration of the COVID-19 declaration under Section 564(b)(1) of the Act, 21 U.S.C. section 360bbb-3(b)(1), unless the authorization is terminated or revoked.  Performed at [facility], [address], [place], [place] [address]   Culture, blood (routine x 2) Call MD if unable to obtain prior to antibiotics being given     Status: None   Collection Time: 06/09/22 10:48 PM   Specimen: BLOOD LEFT ARM  Result Value Ref Range Status   Specimen Description BLOOD LEFT ARM BOTTLES DRAWN AEROBIC AND ANAEROBIC  Final   Special Requests Blood Culture adequate volume  Final   Culture   Final    NO GROWTH 5 DAYS Performed at [facility], [address] [place], [place] [address]    Report Status 06/14/2022 FINAL  Final  Culture, blood (routine x 2) Call MD if unable to obtain prior to antibiotics being given     Status: None   Collection Time: 06/09/22 10:49 PM   Specimen: Site Not Specified; Blood  Result Value Ref Range Status   Specimen Description   Final    SITE NOT SPECIFIED BOTTLES DRAWN AEROBIC AND ANAEROBIC   Special Requests Blood Culture adequate volume  Final   Culture   Final    NO GROWTH 5 DAYS Performed at [facility], [address], [place], [place] [address]    Report Status 06/14/2022 FINAL  Final  Culture, body fluid w Gram Stain-bottle     Status: None   Collection Time: 06/10/22 11:35 AM   Specimen: Pleura  Result Value Ref Range Status   Specimen Description PLEURAL  Final   Special Requests BOTTLES DRAWN AEROBIC AND ANAEROBIC 10CC  Final   Culture   Final    NO GROWTH 5  DAYS Performed at [facility], [address], [place], [place] [address]    Report Status 06/15/2022 FINAL  Final  Gram stain     Status: None   Collection Time: 06/10/22 11:35 AM   Specimen: Pleura  Result Value Ref Range Status   Specimen Description PLEURAL  Final   Special Requests BOTTLES DRAWN AEROBIC AND ANAEROBIC 10 CC  Final   Gram Stain   Final    NO ORGANISMS SEEN WBC PRESENT,BOTH PMN AND MONONUCLEAR CYTOSPIN SMEAR Performed at [facility], [address], [place],  [address]    Report Status 06/10/2022 FINAL  Final  MRSA Next Gen by PCR, Nasal     Status: None   Collection Time: 06/10/22  5:00 PM   Specimen: Nasal Mucosa; Nasal Swab  Result Value Ref Range Status   MRSA by PCR Next Gen NOT DETECTED NOT DETECTED Final    Comment: (NOTE) The GeneXpert MRSA Assay (FDA approved for NASAL specimens only), is one component of a comprehensive MRSA colonization surveillance program. It is not intended to diagnose MRSA infection nor to guide or monitor treatment for  MRSA infections. Test performance is not FDA approved in patients less than 83 years old. Performed at [facility], [address] [place], [place] [address]       Radiology Studies: CT HEAD WO CONTRAST (5MM)  Result Date: 06/15/2022 CLINICAL DATA:  Altered mental status EXAM: CT HEAD WITHOUT CONTRAST TECHNIQUE: Contiguous axial images were obtained from the base of the skull through the vertex without intravenous contrast. RADIATION DOSE REDUCTION: This exam was performed according to the departmental dose-optimization program which includes automated exposure control, adjustment of the mA and/or kV according to patient size and/or use of iterative reconstruction technique. COMPARISON:  11/20/2021 FINDINGS: Brain: No mass, hemorrhage or extra-axial collection. There is bilateral basal ganglia mineralization. There is periventricular hypoattenuation compatible with chronic microvascular disease. Mild  volume loss for age. Vascular: Calcific atherosclerosis of the ICAs at the skull base. Skull: Negative Sinuses/Orbits: No acute finding. Other: None. IMPRESSION: No acute intracranial abnormality. Chronic ischemic microangiopathy. Electronically Signed   By: Ulyses Jarred M.D.   On: 06/15/2022 01:49      LOS: 7 days    Cordelia Poche, MD [facility] 06/16/2022, 12:24 PM   If 7PM-7AM, please contact night-coverage [email or web address]

## 2022-06-17 DIAGNOSIS — E1129 Type 2 diabetes mellitus with other diabetic kidney complication: Secondary | ICD-10-CM | POA: Diagnosis not present

## 2022-06-17 DIAGNOSIS — N186 End stage renal disease: Secondary | ICD-10-CM | POA: Diagnosis not present

## 2022-06-17 DIAGNOSIS — J90 Pleural effusion, not elsewhere classified: Secondary | ICD-10-CM | POA: Diagnosis not present

## 2022-06-17 DIAGNOSIS — J189 Pneumonia, unspecified organism: Secondary | ICD-10-CM | POA: Diagnosis not present

## 2022-06-17 LAB — BASIC METABOLIC PANEL
Anion gap: 13 (ref 5–15)
BUN: 20 mg/dL (ref 8–23)
CO2: 27 mmol/L (ref 22–32)
Calcium: 7.9 mg/dL — ABNORMAL LOW (ref 8.9–10.3)
Chloride: 94 mmol/L — ABNORMAL LOW (ref 98–111)
Creatinine, Ser: 5.25 mg/dL — ABNORMAL HIGH (ref 0.44–1.00)
GFR, Estimated: 8 mL/min — ABNORMAL LOW (ref >60)
Glucose, Bld: 139 mg/dL — ABNORMAL HIGH (ref 70–99)
Potassium: 4.6 mmol/L (ref 3.5–5.1)
Sodium: 134 mmol/L — ABNORMAL LOW (ref 135–145)

## 2022-06-17 LAB — GLUCOSE, CAPILLARY
Glucose-Capillary: 131 mg/dL — ABNORMAL HIGH (ref 70–99)
Glucose-Capillary: 66 mg/dL — ABNORMAL LOW (ref 70–99)
Glucose-Capillary: 90 mg/dL (ref 70–99)
Glucose-Capillary: 171 mg/dL — ABNORMAL HIGH (ref 70–99)
Glucose-Capillary: 177 mg/dL — ABNORMAL HIGH (ref 70–99)
Glucose-Capillary: 174 mg/dL — ABNORMAL HIGH (ref 70–99)

## 2022-06-17 LAB — CBC
HCT: 25.8 % — ABNORMAL LOW (ref 36.0–46.0)
Hemoglobin: 8.6 g/dL — ABNORMAL LOW (ref 12.0–15.0)
MCH: 31.5 pg (ref 26.0–34.0)
MCHC: 33.3 g/dL (ref 30.0–36.0)
MCV: 94.5 fL (ref 80.0–100.0)
Platelets: 178 10*3/uL (ref 150–400)
RBC: 2.73 MIL/uL — ABNORMAL LOW (ref 3.87–5.11)
RDW: 17.4 % — ABNORMAL HIGH (ref 11.5–15.5)
WBC: 12.9 10*3/uL — ABNORMAL HIGH (ref 4.0–10.5)
nRBC: 0 % (ref 0.0–0.2)

## 2022-06-17 MED ORDER — CHLORHEXIDINE GLUCONATE CLOTH 2 % EX PADS
6.0000 | MEDICATED_PAD | Freq: Every day | CUTANEOUS | Status: DC
  Administered 2022-06-17 – 2022-07-01 (×7): 6 via TOPICAL

## 2022-06-17 MED ORDER — DARBEPOETIN ALFA 60 MCG/0.3ML IJ SOSY
60.0000 ug | PREFILLED_SYRINGE | INTRAMUSCULAR | Status: DC
  Administered 2022-06-24 – 2022-06-30 (×2): 60 ug via SUBCUTANEOUS
  Filled 2022-06-17 (×2): qty 0.3

## 2022-06-17 NOTE — Progress Notes (Signed)
Blood glucose 66, pt. Asymptomatic, awake and alert, 4 oz of OJ given, daughter in, will ensure pt. Eats lunch, will re-check blood glucose after lunch, Dr. Teryl Lucy aware

## 2022-06-17 NOTE — Progress Notes (Signed)
Physical Therapy Treatment Patient Details Name: [name] MRN: [id number] DOB: 12/06/1940 Today's Date: 06/17/2022   History of Present Illness 82 y.o. female admitted on 06/07/2022 with left-sided pleural effusion, pneumonia and dyspnea. PMHx: ESRD, diabetes mellitus type 2, GERD, hypertension, history of stroke.    PT Comments    Continuing work on functional mobility and activity tolerance;  Pt very fatigued today, and needing lots of encouragement to participate; Overall needed heavy mod assist to come up to eOB, stand from EOB and from recliner; needed 2 person assist to safely complete amb trial with RW; Will need to see more progress with functional mobility -- otherwise, we much consider SNF for rehab to maximize independence and safety with mobility   Recommendations for follow up therapy are one component of a multi-disciplinary discharge planning process, led by the attending physician.  Recommendations may be updated based on patient status, additional functional criteria and insurance authorization.  Follow Up Recommendations  Home health PT     Assistance Recommended at Discharge Frequent or constant Supervision/Assistance  Patient can return home with the following A lot of help with walking and/or transfers   Equipment Recommendations  Wheelchair (measurements PT);Wheelchair cushion (measurements PT);Rolling walker (2 wheels);BSC/3in1    Recommendations for Other Services       Precautions / Restrictions Precautions Precautions: Fall     Mobility  Bed Mobility Overal bed mobility: Needs Assistance Bed Mobility: Supine to Sit     Supine to sit: Max assist, Mod assist     General bed mobility comments: Max assist and use of bed pad to initaite movement towards EOB    Transfers Overall transfer level: Needs assistance Equipment used: Rolling walker (2 wheels) Transfers: Sit to/from Stand Sit to Stand: Mod assist   Step pivot transfers: Max  assist       General transfer comment: Very little initiation today, needed heavy mod assist to shift weight onto pt's feet and initatie rise; took a few seps to recliner, then knees buckled, requiring Max assist to shift hips over to chair to sit    Ambulation/Gait Ambulation/Gait assistance: Mod assist, +2 safety/equipment Gait Distance (Feet): 8 Feet Assistive device: Rolling walker (2 wheels) Gait Pattern/deviations: Decreased step length - right, Decreased step length - left, Decreased stride length, Trunk flexed, Step-through pattern       General Gait Details: Short walk today with daughter pushing chair for safety; very shaky   Stairs             Wheelchair Mobility    Modified Rankin (Stroke Patients Only)       Balance     Sitting balance-Leahy Scale: Fair       Standing balance-Leahy Scale: Zero                              Cognition Arousal/Alertness: Awake/alert, Lethargic (Eyes tending to drift closed) Behavior During Therapy: Flat affect   Area of Impairment: Orientation, Following commands, Safety/judgement, Problem solving                       Following Commands: Follows one step commands inconsistently, Follows one step commands with increased time Safety/Judgement: Decreased awareness of deficits, Decreased awareness of safety   Problem Solving: Slow processing, Difficulty sequencing, Decreased initiation, Requires verbal cues, Requires tactile cues General Comments: Needs up to mod assist to initiate tasks related to mobility  Exercises      General Comments General comments (skin integrity, edema, etc.): did not verbalize much during session      Pertinent Vitals/Pain Pain Assessment Pain Assessment: No/denies pain Pain Intervention(s): Monitored during session    Home Living                          Prior Function            PT Goals (current goals can now be found in the care  plan section) Acute Rehab PT Goals Patient Stated Goal: did not state today PT Goal Formulation: With patient/family Time For Goal Achievement: 06/25/22 Potential to Achieve Goals: Fair Progress towards PT goals: Not progressing toward goals - comment (more fatigue today)    Frequency    Min 3X/week      PT Plan  (Will need to see better activity tolerance adn progress over the next few sessions; if not, must consider SNF)    Co-evaluation              AM-PAC PT "6 Clicks" Mobility   Outcome Measure  Help needed turning from your back to your side while in a flat bed without using bedrails?: A Little Help needed moving from lying on your back to sitting on the side of a flat bed without using bedrails?: A Lot Help needed moving to and from a bed to a chair (including a wheelchair)?: A Lot Help needed standing up from a chair using your arms (e.g., wheelchair or bedside chair)?: A Lot Help needed to walk in hospital room?: A Lot Help needed climbing 3-5 steps with a railing? : Total 6 Click Score: 12    End of Session Equipment Utilized During Treatment: Gait belt Activity Tolerance: Patient limited by fatigue Patient left: in chair;with call bell/phone within reach;with chair alarm set Nurse Communication: Mobility status PT Visit Diagnosis: Unsteadiness on feet (R26.81);Other abnormalities of gait and mobility (R26.89);Muscle weakness (generalized) (M62.81);Difficulty in walking, not elsewhere classified (R26.2)     Time: 5284-1324 PT Time Calculation (min) (ACUTE ONLY): 21 min  Charges:  $Gait Training: 8-22 mins                     [name], [place] Office [phone]    Colletta [place] 06/17/2022, 3:09 PM

## 2022-06-17 NOTE — Consult Note (Addendum)
Chief Complaint: Patient was seen in consultation today for empyema chest tube drain Chief Complaint  Patient presents with   Shortness of Breath   at the request of Dr Lonny Prude  Supervising Physician: Markus Daft  Patient Status: [facility] - In-pt  History of Present Illness: [name] is a 82 y.o. 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   Dialysis patient ([place])    M, W, F   Fistula    R arm   GERD (gastroesophageal reflux disease)    Hypertension    Renal disorder    Shortness of breath    Stroke [facility])    right side weakness    Past Surgical History:  Procedure Laterality Date   AMPUTATION Right 01/13/2020   Procedure: AMPUTATION RIGHT FOOT 1ST RAY;  Surgeon: Newt Minion, MD;  Location: [place];  Service: Orthopedics;  Laterality: Right;   AV FISTULA PLACEMENT Right 09/08/2013   Procedure: CREATION OF RIGHT BRACHIAL CEPHALIC ARTERIOVENOUS FISTULA ;  Surgeon: Mal Misty, MD;  Location: [place];  Service: Vascular;  Laterality: Right;   [place] Right 01/26/2014   Procedure: Right Arm BASILIC VEIN TRANSPOSITION;  Surgeon: Mal Misty, MD;  Location: [place];  Service: Vascular;  Laterality: Right;   BIOPSY  09/10/2021   Procedure: BIOPSY;  Surgeon: Harvel Quale, MD;  [facility];  Service: Gastroenterology;;  gastric   BIOPSY  10/07/2021   Procedure: BIOPSY;  Surgeon: Irving Copas., MD;  [facility]: [facility];  Service: Gastroenterology;;   BIOPSY  04/17/2022   Procedure: BIOPSY;  Surgeon: Irving Copas., MD;  Location: [facility];  Service: Gastroenterology;;   CATARACT EXTRACTION W/PHACO  11/20/2011   Procedure: CATARACT EXTRACTION PHACO AND INTRAOCULAR LENS PLACEMENT ([place]);  Surgeon: Tonny Branch, MD;  [facility];  Service: Ophthalmology;  Laterality: Right;  CDE 18.82   CATARACT EXTRACTION W/PHACO Left 11/18/2012   Procedure: CATARACT EXTRACTION PHACO AND INTRAOCULAR LENS PLACEMENT (I[facility]);  Surgeon: Tonny Branch, MD;  [facility]: [facility];  Service: Ophthalmology;  Laterality: Left;  CDE: 18.97   COLONOSCOPY N/A 02/09/2013   Procedure: COLONOSCOPY;  Surgeon: Rogene Houston, MD;  [facility];  Service: Endoscopy;  Laterality: N/A;  305-moved to 220 Ann to notify pt   COLONOSCOPY N/A 07/08/2018   Procedure: COLONOSCOPY;  Surgeon: Rogene Houston, MD;  [facility];  Service: Endoscopy;  Laterality: N/A;  930   ENDOSCOPIC MUCOSAL RESECTION N/A 10/07/2021   Procedure: ENDOSCOPIC MUCOSAL RESECTION;  Surgeon: Rush Landmark Telford Nab., MD;  [facility];  Service: Gastroenterology;  Laterality: N/A;   ESOPHAGOGASTRODUODENOSCOPY N/A 04/17/2022   Procedure: ESOPHAGOGASTRODUODENOSCOPY (EGD);  Surgeon: Irving Copas., MD;  Location: [facility];  Service: Gastroenterology;  Laterality: N/A;   ESOPHAGOGASTRODUODENOSCOPY (EGD) WITH PROPOFOL N/A 09/10/2021   Procedure: ESOPHAGOGASTRODUODENOSCOPY (EGD) WITH PROPOFOL;  Surgeon: Harvel Quale, MD;  [facility];  Service: Gastroenterology;  Laterality: N/A;   ESOPHAGOGASTRODUODENOSCOPY (EGD) WITH PROPOFOL N/A 10/07/2021   Procedure: ESOPHAGOGASTRODUODENOSCOPY (EGD) WITH PROPOFOL;  Surgeon: Rush Landmark Telford Nab., MD;  [facility];  Service: Gastroenterology;  Laterality: N/A;   EUS N/A 10/07/2021   Procedure: UPPER ENDOSCOPIC ULTRASOUND (EUS) RADIAL;  Surgeon: Irving Copas., MD;  [facility];  Service: Gastroenterology;  Laterality: N/A;   GASTRIC VARICES BANDING  10/07/2021   Procedure: duodenal tumor BANDING;  Surgeon: Rush Landmark Telford Nab., MD;  Location: [facility];  Service: Gastroenterology;;  with emr kit   HEMOSTASIS CLIP PLACEMENT  10/07/2021   Procedure: HEMOSTASIS CLIP PLACEMENT;  Surgeon: Irving Copas., MD;  Location: [facility];  Service: Gastroenterology;;   INSERTION OF DIALYSIS CATHETER Right 06/24/2013   Procedure: INSERTION OF DIALYSIS CATHETER: Ultrasound guided;  Surgeon: Serafina Mitchell, MD;  Location: [place];  Service: Vascular;  Laterality: Right;   LIGATION OF ARTERIOVENOUS  FISTULA Right 01/26/2014   Procedure: LIGATION OF ARTERIOVENOUS  FISTULA;  Surgeon: Mal Misty, MD;  Location: [place];  Service: Vascular;  Laterality: Right;   LOOP RECORDER IMPLANT N/A 07/21/2013   Procedure: LOOP RECORDER IMPLANT;  Surgeon: Evans Lance, MD;  Location: [facility] CATH [facility];  Service: Cardiovascular;  Laterality: N/A;   POLYPECTOMY  07/08/2018   Procedure: POLYPECTOMY;  Surgeon: Rogene Houston, MD;  [facility];  Service: Endoscopy;;  Descending colon polyps x 2    SUBMUCOSAL LIFTING INJECTION  10/07/2021   Procedure: SUBMUCOSAL LIFTING INJECTION;  Surgeon: Irving Copas., MD;  [facility];  Service: Gastroenterology;;   TEE WITHOUT CARDIOVERSION N/A 07/21/2013   Procedure: TRANSESOPHAGEAL ECHOCARDIOGRAM (TEE);  Surgeon: Dorothy Spark, MD;  Location: [place];  Service: Cardiovascular;  Laterality: N/A;   UPPER ESOPHAGEAL ENDOSCOPIC ULTRASOUND (EUS) N/A 04/17/2022   Procedure: UPPER ESOPHAGEAL ENDOSCOPIC ULTRASOUND (EUS);  Surgeon: Irving Copas., MD;  Location: [facility];  Service: Gastroenterology;  Laterality: N/A;    Allergies: Ambien [zolpidem] and Reglan [metoclopramide]  Medications: Prior to Admission medications   Medication Sig Start Date End Date Taking? Authorizing  Provider  albuterol (VENTOLIN HFA) 108 (90 Base) MCG/ACT inhaler Inhale 2 puffs into the lungs every 6 (six) hours as needed for wheezing or shortness of breath. 11/21/20  Yes Gerlene Fee, NP  aspirin 325 MG tablet Take 650 mg by mouth daily. 07/09/18  Yes Rehman, Mechele Dawley, MD  benzonatate (TESSALON) 100 MG capsule Take 1 capsule (100 mg total) by mouth 3 (three) times daily as needed for cough. 06/03/22  Yes Shah, Pratik D, DO  cyanocobalamin (,VITAMIN B-12,) 1000 MCG/ML injection Inject 1,000 mcg into the muscle every 30 (thirty) days. 11/01/14  Yes [provider]  diclofenac Sodium (VOLTAREN) 1 % GEL Apply 4 g topically 4 (four) times daily as needed. Apply to bilateral knees 03/16/21  Yes Scot Jun, FNP  insulin NPH-regular Human (HUMULIN 70/30) (70-30) 100 UNIT/ML injection Inject 5-14 Units into the skin 2 (two) times daily with a meal. Patient taking differently: Inject 5-10 Units into the skin 2 (two) times daily with a meal. Take 10 units in the morning and 5 units at bedtime 02/03/22  Yes Nida, Marella Chimes, MD  labetalol (NORMODYNE) 200 MG tablet Take 200 mg by mouth 2 (two) times daily. 07/26/21  Yes [provider]  multivitamin (RENA-VIT) TABS tablet Take 1 tablet by mouth at bedtime. 08/21/14  Yes Kirsteins, Luanna Salk, MD  omeprazole (PRILOSEC) 40 MG capsule TAKE ONE  CAPSULE BY MOUTH TWICE DAILY BEFORE A meal Patient taking differently: Take 40 mg by mouth in the morning and at bedtime. 04/16/22  Yes Mansouraty, Telford Nab., MD  sevelamer carbonate (RENVELA) 800 MG tablet Take 1 tablet (800 mg total) by mouth 2 (two) times daily before lunch and supper. 11/21/20  Yes Gerlene Fee, NP  BD INSULIN SYRINGE U/F 31G X 5/16" 0.3 ML MISC Inject 1 each into the skin 2 (two) times daily. as directed 11/21/20   Gerlene Fee, NP     Family History  Problem Relation Age of Onset   Cancer Sister    Cancer Brother    Anesthesia problems Neg Hx    Hypotension Neg Hx     Malignant hyperthermia Neg Hx    Pseudochol deficiency Neg Hx     Social History   Socioeconomic History   Marital status: Widowed    Spouse name: Not on file   Number of children: Not on file   Years of education: Not on file   Highest education level: Not on file  Occupational History   Not on file  Tobacco Use   Smoking status: Never   Smokeless tobacco: Never  Vaping Use   Vaping Use: Never used  Substance and Sexual Activity   Alcohol use: No   Drug use: No   Sexual activity: Not on file  Other Topics Concern   Not on file  Social History Narrative   ** Merged History Encounter **       Social Determinants of Health   Financial Resource Strain: Not on file  Food Insecurity: No Food Insecurity (06/13/2022)   Hunger Vital Sign    Worried About Running Out of Food in the Last Year: Never true    Ran Out of Food in the Last Year: Never true  Transportation Needs: No Transportation Needs (06/13/2022)   PRAPARE - Hydrologist (Medical): No    Lack of Transportation (Non-Medical): No  Physical Activity: Not on file  Stress: Not on file  Social Connections: Not on file    Review of Systems: A 12 point ROS discussed and pertinent positives are indicated in the HPI above.  All other systems are negative.  Review of Systems  Constitutional:  Negative for fever.  Respiratory:  Positive for cough and shortness of breath.   Cardiovascular:  Negative for chest pain.  Neurological:  Positive for weakness.  Psychiatric/Behavioral:  Positive for confusion. Negative for behavioral problems.     Vital Signs: BP (!) 123/49 (BP Location: Left Arm)   Pulse 81   Temp 98 F (36.7 C) (Oral)   Resp 18   Ht _0  (1.702 m)   Wt 151 lb 10.8 oz (68.8 kg)   SpO2 100%   BMI 23.76 kg/m    Physical Exam Vitals reviewed.  Cardiovascular:     Rate and Rhythm: Normal rate and regular rhythm.  Pulmonary:     Breath sounds: Examination of the  left-upper field reveals decreased breath sounds. Examination of the left-middle field reveals decreased breath sounds. Examination of the left-lower field reveals decreased breath sounds. Decreased breath sounds present. No wheezing.  Skin:    General: Skin is warm.  Neurological:     Mental Status: She is disoriented.  Psychiatric:     Comments: Daughter at bedside Consented for IR procedure     Imaging: CT HEAD WO CONTRAST (5MM)  Result Date: 06/15/2022 CLINICAL DATA:  Altered mental  status EXAM: CT HEAD WITHOUT CONTRAST TECHNIQUE: Contiguous axial images were obtained from the base of the skull through the vertex without intravenous contrast. RADIATION DOSE REDUCTION: This exam was performed according to the departmental dose-optimization program which includes automated exposure control, adjustment of the mA and/or kV according to patient size and/or use of iterative reconstruction technique. COMPARISON:  11/20/2021 FINDINGS: Brain: No mass, hemorrhage or extra-axial collection. There is bilateral basal ganglia mineralization. There is periventricular hypoattenuation compatible with chronic microvascular disease. Mild volume loss for age. Vascular: Calcific atherosclerosis of the ICAs at the skull base. Skull: Negative Sinuses/Orbits: No acute finding. Other: None. IMPRESSION: No acute intracranial abnormality. Chronic ischemic microangiopathy. Electronically Signed   By: Ulyses Jarred M.D.   On: 06/15/2022 01:49   CT CHEST WO CONTRAST  Result Date: 06/13/2022 CLINICAL DATA:  Pleural effusion, empyema EXAM: CT CHEST WITHOUT CONTRAST TECHNIQUE: Multidetector CT imaging of the chest was performed following the standard protocol without IV contrast. RADIATION DOSE REDUCTION: This exam was performed according to the departmental dose-optimization program which includes automated exposure control, adjustment of the mA and/or kV according to patient size and/or use of iterative reconstruction  technique. COMPARISON:  Previous studies including chest radiographs done on 06/12/2022 and CT done on 06/01/2022 FINDINGS: Cardiovascular: Heart is enlarged in size. Coronary artery calcifications are seen. Small pericardial effusion is present.There is a vascular stent in the right upper arm. Mediastinum/Nodes: Slightly enlarged lymph nodes in mediastinum appear stable. Lungs/Pleura: There is interval increase in left pleural effusion. There is moderate to large left pleural effusion. Part of the effusion appears to be loculated along the lateral aspect of left mid lung field and within interlobar fissure. Lobulations are seen in the effusion in the posterior aspect of left lower lung field. There is dense infiltrate in left upper lobe. There is interval decrease in area of left upper lobe infiltrate. There is interval decrease in infiltrates in right middle lobe. There is interval decrease in amount of right pleural effusion. There are increased markings in both lower lobes, more so on the left side suggesting atelectasis/pneumonia. There is no pneumothorax. Upper Abdomen: Arterial calcifications are seen in aorta and its major branches. Musculoskeletal: No acute findings are seen. IMPRESSION: There is interval increase in amount of left pleural effusion. There is moderate to large left pleural effusion in the current study. Part of the effusion appears to be loculated within interlobar fissure and along the lateral and posterior aspect of left mid and left lower lung fields. There is no significant thickening of pleura. There are no pockets of air in the pleural effusion. However, possibility of empyema in the left pleural space is not excluded. There is no pneumothorax. There is interval decrease in area of infiltrate in left upper lobe with a dense residual infiltrate in the current study. There is interval decrease in infiltrates in right middle lobe. There is interval worsening of infiltrate in left lower  lobe suggesting atelectasis/pneumonia. Cardiomegaly. Coronary artery disease. Small pericardial effusion. Aortic arteriosclerosis. Electronically Signed   By: Elmer Picker M.D.   On: 06/13/2022 19:43   ECHOCARDIOGRAM SCANNED RESULT  Result Date: 06/13/2022 Ordered by an unspecified provider.  US THORACENTESIS ASP PLEURAL SPACE W/IMG GUIDE  Result Date: 06/12/2022 INDICATION: Recurrent LEFT pleural effusion EXAM: ULTRASOUND GUIDED THERAPEUTIC LEFT THORACENTESIS MEDICATIONS: None. COMPLICATIONS: None immediate. PROCEDURE: An ultrasound guided thoracentesis was thoroughly discussed with the patient and questions answered. The benefits, risks, alternatives and complications were also discussed. The patient understands and wishes  to proceed with the procedure. Written consent was obtained. Ultrasound was performed to localize and mark an adequate pocket of fluid in the LEFT chest. The area was then prepped and draped in the normal sterile fashion. 1% Lidocaine was used for local anesthesia. Under ultrasound guidance a 6 Fr Safe-T-Centesis catheter was introduced. No fluid could be aspirated. Under direct sonographic guidance, a 5 [place] Yueh catheter was placed into the effusion. Only about 100 cc of serosanguineous to bloody fluid could be aspirated. Further ultrasound interrogation demonstrates that the fluid is complicated, containing diffuse internal echogenicity question clot, as well as internal septations, consistent with hemothorax new since previous thoracentesis. Procedure was then terminated. The catheter was removed and a dressing applied. FINDINGS: A only 100 mL of LEFT pleural fluid was removed. Observed recurrent LEFT pleural effusion is complicated containing septations and scattered low level internal echogenicity question clot. IMPRESSION: Ultrasound guided therapeutic LEFT thoracentesis yielding only 100 mL of pleural fluid as above. Electronically Signed   By: Lavonia Dana M.D.   On:  06/12/2022 11:48   DG Chest 1 View  Result Date: 06/12/2022 CLINICAL DATA:  Post LEFT thoracentesis EXAM: CHEST  1 VIEW COMPARISON:  Exam at 1115 hours compared to 06/12/2022 at 0851 hours FINDINGS: Enlargement of cardiac silhouette with stable loop recorder. Pulmonary vascular congestion. Elevation of LEFT diaphragm. Persistent LEFT pleural effusion and basilar atelectasis. No pneumothorax following thoracentesis. Osseous structures unremarkable. IMPRESSION: No pneumothorax following thoracentesis. Electronically Signed   By: Lavonia Dana M.D.   On: 06/12/2022 11:43   DG Chest 2 View  Result Date: 06/12/2022 CLINICAL DATA:  Dyspnea. EXAM: CHEST - 2 VIEW COMPARISON:  June 12, 2022 FINDINGS: Image rotated to LEFT. Accounting for this no change in cardiac enlargement and fullness of hilar structures. Slight enlargement of moderately large LEFT-sided pleural effusion since previous imaging, associated with LEFT lower lobe consolidative changes. Pleural fluid in the LEFT chest is also likely partially loculated in the major fissure based on lateral projection. Mild increased interstitial markings. On limited assessment no acute skeletal process. Cardiac loop recorder projects over the LEFT chest. IMPRESSION: 1. Slight enlargement of moderately large LEFT-sided pleural effusion since previous imaging, associated with LEFT lower lobe consolidative changes. Pleural fluid not tracks into the major fissure in the LEFT chest. 2. LEFT upper lobe process better demonstrated on recent CT and as compared to recent chest radiograph displays little change. 3. Constellation of findings while potentially related to pneumonia should be followed to ensure clearing and exclude the possibility of underlying neoplasm. Electronically Signed   By: Zetta Bills M.D.   On: 06/12/2022 09:11   US THORACENTESIS ASP PLEURAL SPACE W/IMG GUIDE  Result Date: 06/10/2022 INDICATION: LEFT pleural effusion EXAM: ULTRASOUND GUIDED  DIAGNOSTIC AND THERAPEUTIC LEFT THORACENTESIS MEDICATIONS: None. COMPLICATIONS: None immediate. PROCEDURE: An ultrasound guided thoracentesis was thoroughly discussed with the patient and questions answered. The benefits, risks, alternatives and complications were also discussed. The patient understands and wishes to proceed with the procedure. Written consent was obtained. Ultrasound was performed to localize and mark an adequate pocket of fluid in the LEFT chest. The area was then prepped and draped in the normal sterile fashion. 1% Lidocaine was used for local anesthesia. Under ultrasound guidance a 6 Fr Safe-T-Centesis catheter was introduced. Thoracentesis was performed. The catheter was removed and a dressing applied. FINDINGS: A total of approximately 1.2 L of serosanguineous fluid was removed. Samples were sent to the laboratory as requested by the clinical team. IMPRESSION: Successful  ultrasound guided LEFT thoracentesis yielding 1.2 L of pleural fluid. Electronically Signed   By: Lavonia Dana M.D.   On: 06/10/2022 12:55   DG Chest 1 View  Result Date: 06/10/2022 CLINICAL DATA:  LEFT pleural effusion post thoracentesis EXAM: CHEST  1 VIEW COMPARISON:  06/09/2022 FINDINGS: Stable heart size and mediastinal contours. Increased LEFT upper lobe opacity favor infiltrate. Decreased atelectasis and effusion at LEFT base. No pneumothorax following thoracentesis. RIGHT lung clear. Bones demineralized. IMPRESSION: Decrease in LEFT pleural effusion and basilar atelectasis post thoracentesis. No pneumothorax. Increased LEFT upper lobe opacity question infiltrate/pneumonia. Electronically Signed   By: Lavonia Dana M.D.   On: 06/10/2022 12:53   DG Chest 2 View  Result Date: 06/09/2022 CLINICAL DATA:  Shortness of breath EXAM: CHEST - 2 VIEW COMPARISON:  Chest x-ray dated June 01, 2022 FINDINGS: Visualized cardiac and mediastinal contours are unchanged. Moderate to large left pleural effusion, increased in  size when compared with prior exam. Previously described left upper lobe opacity is not well seen on today's exam, likely obscured by large effusion. Increased mild diffuse interstitial opacities. No evidence of pneumothorax. IMPRESSION: 1. Moderate to large left pleural effusion, increased in size when compared with prior exam. 2. Previously described left upper lobe opacity is not well seen on today's exam, likely obscured by large effusion. 3. Increased mild diffuse interstitial opacities, concerning for pulmonary edema Electronically Signed   By: Yetta Glassman M.D.   On: 06/09/2022 20:11   CT Chest W Contrast  Result Date: 06/01/2022 CLINICAL DATA:  Chest pain, abnormal chest radiograph EXAM: CT CHEST WITH CONTRAST TECHNIQUE: Multidetector CT imaging of the chest was performed during intravenous contrast administration. RADIATION DOSE REDUCTION: This exam was performed according to the departmental dose-optimization program which includes automated exposure control, adjustment of the mA and/or kV according to patient size and/or use of iterative reconstruction technique. CONTRAST:  35m OMNIPAQUE IOHEXOL 300 MG/ML  SOLN COMPARISON:  Chest/left rib radiographs dated 06/01/2022 FINDINGS: Cardiovascular: The heart is normal in size. No pericardial effusion. No evidence of thoracic aortic aneurysm. Atherosclerotic calcifications of the aortic arch. Three vessel coronary atherosclerosis. Mediastinum/Nodes: No suspicious mediastinal lymphadenopathy. Visualized thyroid is unremarkable. Lungs/Pleura: Focal patchy/sub solid opacity in the central left upper lobe, corresponding to the radiographic abnormality. Additional mild patchy opacity in the posterior right middle lobe (series 5/image 35). This appearance is compatible with multifocal pneumonia. No frank interstitial edema. Moderate left and small right pleural effusions. Associated bibasilar opacities, likely atelectasis. No pneumothorax. Upper Abdomen:  Visualized upper abdomen is grossly unremarkable, noting vascular calcifications. Musculoskeletal: Mild degenerative changes of the visualized thoracolumbar spine. No rib fracture is seen. IMPRESSION: Multifocal pneumonia in the left upper lobe and right middle lobe, accounting for the radiographic abnormality. Follow-up chest radiographs are suggested in 4-6 weeks to document clearance. Moderate left and small right pleural effusions. No frank interstitial edema. No rib fracture is seen. Aortic Atherosclerosis (ICD10-I70.0). Electronically Signed   By: SJulian HyM.D.   On: 06/01/2022 23:50   DG Ribs Unilateral W/Chest Left  Result Date: 06/01/2022 CLINICAL DATA:  Shortness of breath, cough EXAM: LEFT RIBS AND CHEST - 3+ VIEW COMPARISON:  Chest radiograph dated 05/09/2022 FINDINGS: Rounded/masslike left upper lobe opacity, new from recent prior chest radiograph, suspicious for pneumonia. Left basilar opacity, atelectasis versus pneumonia. No pleural effusion or pneumothorax. The heart is top-normal in size. No displaced left rib fracture is seen. IMPRESSION: Rounded/masslike left upper lobe opacity, new from recent chest radiograph, suspicious for pneumonia.  Left basilar opacity, atelectasis versus pneumonia. At a minimum, follow-up chest radiographs are suggested in 3-4 weeks to document clearance. If the patient does not have signs/symptoms of infection, consider CT chest with contrast for further evaluation. No displaced left rib fracture is seen. Electronically Signed   By: Julian Hy M.D.   On: 06/01/2022 19:03    Labs:  CBC: Recent Labs    06/13/22 0157 06/14/22 0414 06/15/22 0331 06/17/22 0541  WBC 12.1* 14.5* 15.7* 12.9*  HGB 8.3* 7.6* 8.6* 8.6*  HCT 24.9* 21.5* 25.5* 25.8*  PLT 132* 122* 136* 178    COAGS: No results for input(s): "INR", "APTT" in the last 8760 hours.  BMP: Recent Labs    06/11/22 0345 06/14/22 0414 06/15/22 1811 06/17/22 0645  NA 137 136 134*  134*  K 3.7 4.1 4.5 4.6  CL 97* 95* 95* 94*  CO2 _0 GLUCOSE 210* 205* 116* 139*  BUN 17 32* 26* 20  CALCIUM 7.7* 7.7* 7.7* 7.9*  CREATININE 4.54* 6.96* 6.02* 5.25*  GFRNONAA 9* 5* 7* 8*    LIVER FUNCTION TESTS: Recent Labs    05/01/22 2235 06/02/22 0740 06/09/22 2000 06/10/22 0353 06/11/22 0345 06/15/22 1811  BILITOT 0.7 0.8 0.5 0.7  --   --   AST _1 --   --   ALT _2 --   --   ALKPHOS 60 79 68 58  --   --   PROT 6.9 6.7 7.3 6.4*  --   --   ALBUMIN 3.3* 3.0* 3.1* 2.8* 2.5* 2.1*    TUMOR MARKERS: No results for input(s): "AFPTM", "CEA", "CA199", "CHROMGRNA" in the last 8760 hours.  Assessment and Plan:  Left loculated recurrent pleural effusion Scheduled for chest tube drain placement Risks and benefits of chest tube placement were discussed with the patient's daughter at bedside including bleeding, infection, damage to adjacent structures, malfunction of the tube requiring additional procedures and sepsis.  All questions were answered, daughter is agreeable to proceed. Consent signed and in chart.  Thank you for this interesting consult.  I greatly enjoyed meeting [name] and look forward to participating in their care.  A copy of this report was sent to the requesting provider on this date.  Electronically Signed: Lavonia Drafts, PA-C 06/17/2022, 10:12 AM   I spent a total of 20 Minutes    in face to face in clinical consultation, greater than 50% of which was counseling/coordinating care for loculated left pleural effusion/chest tube drain

## 2022-06-17 NOTE — Progress Notes (Signed)
[facility] NEPHROLOGY PROGRESS NOTE  Assessment/ Plan: Dialyzes at [facility] MWF  4 hours  EDW 75.5. HD Bath 2K, Dialyzer unknown, Heparin no. Access AVF right-  has post bleeding a lot.  Calcitriol 1 mcg q tx   # SOB/pleural effusion- markedly improved after HD.  off of supplemental oxygen.  Has L sided loculated pleural effusion, s/p thora 100 mL off.  Plan for chest tube placement by IR tomorrow.  #ESRD - On schedule MWF, plan for next HD tomorrow per regular schedule.  Need to coordinate timing with IR procedure.    # HCAP- on vanc/ cefepime.  #HTN / volume: BP improved on labetalol.  Volume management with HD.  # Anemia- esa to start 12/31, transfuse prn.  #Delirium/agitation- thought to be secondary to cefepime which ahs been d/c'd. CTH without acute changes. Per primary.  Subjective: Seen and examined at bedside.  Sitting on chair but not talking much today.  Her daughter was at the bedside.  As per daughter when we go out she talks with her.  No new event.  Plan for IR procedure tomorrow.  Objective Vital signs in last 24 hours: Vitals:   06/16/22 2131 06/17/22 0605 06/17/22 0814 06/17/22 1050  BP: 122/73 (!) 110/58 (!) 123/49 (!) 121/50  Pulse: 82 84 81 79  Resp: '17 18 18 18  '$ Temp: 98.4 F (36.9 C) 97.8 F (36.6 C) 98 F (36.7 C) 97.7 F (36.5 C)  TempSrc: Oral Oral Oral Oral  SpO2: 100% 100% 100% 100%  Weight:      Height:       Weight change:   Intake/Output Summary (Last 24 hours) at 06/17/2022 1055 Last data filed at 06/17/2022 0601 Gross per 24 hour  Intake 137.1 ml  Output --  Net 137.1 ml        Labs: RENAL PANEL Recent Labs    05/01/22 2235 05/09/22 0659 06/01/22 1910 06/02/22 0740 06/03/22 0329 06/04/22 0344 06/09/22 2000 06/10/22 0353 06/11/22 0345 06/12/22 0327 06/13/22 0157 06/14/22 0414 06/15/22 0331 06/15/22 1811 06/17/22 0645  NA 140   < > 136 137 136 136 139 139 137  --   --  136  --  134* 134*  K 3.7    < > 3.5 4.3 3.0* 3.2* 3.5 3.9 3.7  --   --  4.1  --  4.5 4.6  CL 100   < > 96* 96* 95* 96* 99 101 97*  --   --  95*  --  95* 94*  CO2 26   < > '28 28 28 28 28 25 28  '$ --   --  26  --  25 27  GLUCOSE 275*   < > 185* 220* 132* 146* 187* 184* 210*  --   --  205*  --  116* 139*  BUN 27*   < > 36* 41* 21 34* 28* 31* 17  --   --  32*  --  26* 20  CREATININE 7.01*   < > 7.26* 8.11* 4.68* 6.42* 7.43* 7.73* 4.54*  --   --  6.96*  --  6.02* 5.25*  CALCIUM 8.1*   < > 8.2* 8.0* 7.8* 7.9* 8.3* 7.9* 7.7*  --   --  7.7*  --  7.7* 7.9*  MG  --   --   --  2.2  --  1.9  --  2.1 1.7 1.7 1.6* 1.8 1.8  --   --   PHOS  --   --   --  4.6  --   --   --   --  2.2*  --   --   --   --  3.1  --   ALBUMIN 3.3*  --   --  3.0*  --   --  3.1* 2.8* 2.5*  --   --   --   --  2.1*  --    < > = values in this interval not displayed.      Liver Function Tests: Recent Labs  Lab 06/11/22 0345 06/15/22 1811  ALBUMIN 2.5* 2.1*    No results for input(s): "LIPASE", "AMYLASE" in the last 168 hours. No results for input(s): "AMMONIA" in the last 168 hours. CBC: Recent Labs    06/12/22 0327 06/13/22 0157 06/14/22 0414 06/15/22 0331 06/15/22 0719 06/17/22 0541  HGB 8.1* 8.3* 7.6* 8.6*  --  8.6*  MCV 93.4 96.5 92.3 93.1  --  94.5  FERRITIN  --   --   --  1,418*  --   --   TIBC  --   --   --   --  136*  --   IRON  --   --   --   --  31  --      Cardiac Enzymes: No results for input(s): "CKTOTAL", "CKMB", "CKMBINDEX", "TROPONINI" in the last 168 hours. CBG: Recent Labs  Lab 06/16/22 1011 06/16/22 1132 06/16/22 1654 06/16/22 2132 06/17/22 0729  GLUCAP 108* 105* 175* 96 131*     Iron Studies:  Recent Labs    06/15/22 0331 06/15/22 0719  IRON  --  31  TIBC  --  136*  FERRITIN 1,418*  --     Studies/Results: No results found.  Medications: Infusions:  piperacillin-tazobactam (ZOSYN)  IV 2.25 g (06/17/22 0547)   [START ON 06/18/2022] vancomycin      Scheduled Medications:  aspirin  650 mg Oral Daily    Chlorhexidine Gluconate Cloth  6 each Topical Q0600   darbepoetin (ARANESP) injection - DIALYSIS  60 mcg Subcutaneous Q Sun-1800   dextromethorphan-guaiFENesin  1 tablet Oral BID   hydrocerin   Topical BID   insulin aspart  0-15 Units Subcutaneous TID WC   insulin aspart  0-5 Units Subcutaneous QHS   labetalol  200 mg Oral BID   pantoprazole  40 mg Oral Daily   QUEtiapine  12.5 mg Oral QHS   sevelamer carbonate  800 mg Oral BID AC   vancomycin variable dose per unstable renal function (pharmacist dosing)   Does not apply See admin instructions    have reviewed scheduled and prn medications.  Physical Exam: General:NAD, comfortable Heart:RRR, s1s2 nl Lungs:clear b/l, no crackle Abdomen:soft, Non-tender, non-distended Extremities:No edema Dialysis Access: Right upper extremity AV fistula has thrill and bruit present  [name] [name] 06/17/2022,10:55 AM  LOS: 8 days

## 2022-06-17 NOTE — Progress Notes (Signed)
PROGRESS NOTE    [name]  NWG:[id number] DOB: 06/10/1941 DOA: 06/09/2022 PCP: [name], MD   Brief Narrative: [name] is a 83 y.o. female with a history of ESRD, diabetes mellitus type 2, GERD, hypertension, stroke. Patient presented secondary to shortness of breath and found to have recurrent pneumonia with associated left sided pleural effusion. Empiric antibiotics initiated and patient transferred to [facility] for consideration of chest tube placement. During admission, patient developed hospital delirium.   Assessment and Plan: * Pleural effusion Concern for parapneumonic effusion. IR consulted and performed thoracentesis, yielding 1.2 L of serosanguinous fluid, followed by repeat thoracentesis with minimal amount of serosanguinous fluid and likely clot. Patient transferred to [facility] for pulmonology consult and consideration of chest tube. Concern for hemothorax. CT surgery has recommended CT guided chest tube. IR states chest tube cannot be placed until 06/18/2022. Patient remains on room air.  Delirium In setting of history of multiple vascular injuries and current hospitalization. Likely hospital related delirium. Patient is also on Cefepime which could also be the culprit. Delirium improved. CT head unremarkable for acute process. -Delirium precautions -Continue Seroquel 12.5 mg qHS to help with sleep  CAP (community acquired pneumonia) Patient recently treated with Unasyn and azithromycin, prior to admission. On admission, patient was broadened to Vancomycin and Cefepime. Procalcitonin mildly elevated at 0.96. Afebrile with leukocytosis. Complicated by left pleural effusion. MRSA PCR negative. -Continue Vancomycin and Zosyn  GERD (gastroesophageal reflux disease) -Continue Protonix  DMII (diabetes mellitus, type 2) (HCC) -Continue SSI  ESRD (end stage renal disease) [facility]) Nephrology consulted for hemodialysis while  inpatient.  Thrombocytopenia (HCC) Mild and likely reactive, secondary to infection. Stable.  Essential hypertension -Continue labetalol  Lethargy-resolved as of 06/17/2022 Likely secondary to Ativan given night prior to late session of HD. -Avoid benzodiazepines  Chest pain-resolved as of 06/13/2022 Likely secondary to pulmonary edema. Resolved.  Severe uncontrolled hypertension-resolved as of 06/13/2022 Patient with blood pressure up to 214/94, treated with Hydralazine IV and hemodialysis. Resolved.    DVT prophylaxis: Heparin subq Code Status:   Code Status: Full Code Family Communication: Daughter at bedside Disposition Plan: Discharge home pending continued specialist management/recommendations/chest tube placement and removal   Consultants:  Pulmonology Cardiothoracic surgery  Procedures:  12/28: Left thoracentesis  Antimicrobials: Vancomycin Cefepime  Zosyn IV   Subjective: Patient reports no issues this morning.  Objective: BP (!) 121/50 (BP Location: Left Arm)   Pulse 79   Temp 97.7 F (36.5 C) (Oral)   Resp 18   Ht '5\' 7"'$  (1.702 m)   Wt 68.8 kg   SpO2 100%   BMI 23.76 kg/m   Examination:  General exam: Appears calm and comfortable Respiratory system: Diminished breaths. Respiratory effort normal. Cardiovascular system: S1 & S2 heard, Normal rate with regular rhythm. Gastrointestinal system: Abdomen is nondistended, soft and nontender. Normal bowel sounds heard.    Data Reviewed: I have personally reviewed following labs and imaging studies  CBC Lab Results  Component Value Date   WBC 12.9 (H) 06/17/2022   RBC 2.73 (L) 06/17/2022   HGB 8.6 (L) 06/17/2022   HCT 25.8 (L) 06/17/2022   MCV 94.5 06/17/2022   MCH 31.5 06/17/2022   PLT 178 06/17/2022   MCHC 33.3 06/17/2022   RDW 17.4 (H) 06/17/2022   LYMPHSABS 0.9 06/15/2022   MONOABS 2.3 (H) 06/15/2022   EOSABS 0.0 06/15/2022   BASOSABS 0.0 57/84/6962     Last metabolic panel Lab  Results  Component  Value Date   NA 134 (L) 06/17/2022   K 4.6 06/17/2022   CL 94 (L) 06/17/2022   CO2 27 06/17/2022   BUN 20 06/17/2022   CREATININE 5.25 (H) 06/17/2022   GLUCOSE 139 (H) 06/17/2022   GFRNONAA 8 (L) 06/17/2022   GFRAA 5 (L) 01/16/2020   CALCIUM 7.9 (L) 06/17/2022   PHOS 3.1 06/15/2022   PROT 6.4 (L) 06/10/2022   ALBUMIN 2.1 (L) 06/15/2022   BILITOT 0.7 06/10/2022   ALKPHOS 58 06/10/2022   AST 17 06/10/2022   ALT 13 06/10/2022   ANIONGAP 13 06/17/2022    GFR: Estimated Creatinine Clearance: 8 mL/min (A) (by C-G formula based on SCr of 5.25 mg/dL (H)).  Recent Results (from the past 240 hour(s))  Expectorated Sputum Assessment w Gram Stain, Rflx to Resp Cult     Status: None   Collection Time: 06/09/22 12:04 AM   Specimen: Expectorated Sputum  Result Value Ref Range Status   Specimen Description EXPECTORATED SPUTUM  Final   Special Requests NONE  Final   Sputum evaluation   Final    Sputum specimen not acceptable for testing.  Please recollect.   CALL HARDIN,S. ON 06/10/22 '@[id number]'$  BY [facility] Performed at [facility], [address] [place], [place] [address]    Report Status 06/10/2022 FINAL  Final  Resp panel by RT-PCR (RSV, Flu A&B, Covid) Anterior Nasal Swab     Status: None   Collection Time: 06/09/22 10:17 PM   Specimen: Anterior Nasal Swab  Result Value Ref Range Status   SARS Coronavirus 2 by RT PCR NEGATIVE NEGATIVE Final    Comment: (NOTE) SARS-CoV-2 target nucleic acids are NOT DETECTED.  The SARS-CoV-2 RNA is generally detectable in upper respiratory specimens during the acute phase of infection. The lowest concentration of SARS-CoV-2 viral copies this assay can detect is 138 copies/mL. A negative result does not preclude SARS-Cov-2 infection and should not be used as the sole basis for treatment or other patient management decisions. A negative result may occur with  improper specimen collection/handling, submission of specimen other than  nasopharyngeal swab, presence of viral mutation(s) within the areas targeted by this assay, and inadequate number of viral copies(<138 copies/mL). A negative result must be combined with clinical observations, patient history, and epidemiological information. The expected result is Negative.  Fact Sheet for Patients:  EntrepreneurPulse.com.au  Fact Sheet for Healthcare Providers:  IncredibleEmployment.be  This test is no t yet approved or cleared by the [place] FDA and  has been authorized for detection and/or diagnosis of SARS-CoV-2 by FDA under an Emergency Use Authorization (EUA). This EUA will remain  in effect (meaning this test can be used) for the duration of the COVID-19 declaration under Section 564(b)(1) of the Act, 21 U.S.C.section 360bbb-3(b)(1), unless the authorization is terminated  or revoked sooner.       Influenza A by PCR NEGATIVE NEGATIVE Final   Influenza B by PCR NEGATIVE NEGATIVE Final    Comment: (NOTE) The Xpert Xpress SARS-CoV-2/FLU/RSV plus assay is intended as an aid in the diagnosis of influenza from Nasopharyngeal swab specimens and should not be used as a sole basis for treatment. Nasal washings and aspirates are unacceptable for Xpert Xpress SARS-CoV-2/FLU/RSV testing.  Fact Sheet for Patients: EntrepreneurPulse.com.au  Fact Sheet for Healthcare Providers: IncredibleEmployment.be  This test is not yet approved or cleared by the [place] FDA and has been authorized for detection and/or diagnosis of SARS-CoV-2 by FDA under an Emergency Use Authorization (EUA). This EUA  will remain in effect (meaning this test can be used) for the duration of the COVID-19 declaration under Section 564(b)(1) of the Act, 21 U.S.C. section 360bbb-3(b)(1), unless the authorization is terminated or revoked.     Resp Syncytial Virus by PCR NEGATIVE NEGATIVE Final    Comment:  (NOTE) Fact Sheet for Patients: EntrepreneurPulse.com.au  Fact Sheet for Healthcare Providers: IncredibleEmployment.be  This test is not yet approved or cleared by the [place] FDA and has been authorized for detection and/or diagnosis of SARS-CoV-2 by FDA under an Emergency Use Authorization (EUA). This EUA will remain in effect (meaning this test can be used) for the duration of the COVID-19 declaration under Section 564(b)(1) of the Act, 21 U.S.C. section 360bbb-3(b)(1), unless the authorization is terminated or revoked.  Performed at [facility], [address], [place], [place] [address]   Culture, blood (routine x 2) Call MD if unable to obtain prior to antibiotics being given     Status: None   Collection Time: 06/09/22 10:48 PM   Specimen: BLOOD LEFT ARM  Result Value Ref Range Status   Specimen Description BLOOD LEFT ARM BOTTLES DRAWN AEROBIC AND ANAEROBIC  Final   Special Requests Blood Culture adequate volume  Final   Culture   Final    NO GROWTH 5 DAYS Performed at [facility], [address] [place], [place] [address]    Report Status 06/14/2022 FINAL  Final  Culture, blood (routine x 2) Call MD if unable to obtain prior to antibiotics being given     Status: None   Collection Time: 06/09/22 10:49 PM   Specimen: Site Not Specified; Blood  Result Value Ref Range Status   Specimen Description   Final    SITE NOT SPECIFIED BOTTLES DRAWN AEROBIC AND ANAEROBIC   Special Requests Blood Culture adequate volume  Final   Culture   Final    NO GROWTH 5 DAYS Performed at [facility], [address] [place], [place] [address]    Report Status 06/14/2022 FINAL  Final  Culture, body fluid w Gram Stain-bottle     Status: None   Collection Time: 06/10/22 11:35 AM   Specimen: Pleura  Result Value Ref Range Status   Specimen Description PLEURAL  Final   Special Requests BOTTLES DRAWN AEROBIC AND ANAEROBIC 10CC  Final   Culture   Final     NO GROWTH 5 DAYS Performed at [facility], [address], [place], [place] [address]    Report Status 06/15/2022 FINAL  Final  Gram stain     Status: None   Collection Time: 06/10/22 11:35 AM   Specimen: Pleura  Result Value Ref Range Status   Specimen Description PLEURAL  Final   Special Requests BOTTLES DRAWN AEROBIC AND ANAEROBIC 10 CC  Final   Gram Stain   Final    NO ORGANISMS SEEN WBC PRESENT,BOTH PMN AND MONONUCLEAR CYTOSPIN SMEAR Performed at [facility], [address] [place], [place] [address]    Report Status 06/10/2022 FINAL  Final  MRSA Next Gen by PCR, Nasal     Status: None   Collection Time: 06/10/22  5:00 PM   Specimen: Nasal Mucosa; Nasal Swab  Result Value Ref Range Status   MRSA by PCR Next Gen NOT DETECTED NOT DETECTED Final    Comment: (NOTE) The GeneXpert MRSA Assay (FDA approved for NASAL specimens only), is one component of a comprehensive MRSA colonization surveillance program. It is not intended to diagnose MRSA infection nor to guide or  monitor treatment for MRSA infections. Test performance is not FDA approved in patients less than 76 years old. Performed at [facility], [address] [place], [place] [address]       Radiology Studies: No results found.    LOS: 8 days    Cordelia Poche, MD [facility] 06/17/2022, 3:35 PM   If 7PM-7AM, please contact night-coverage [email or web address]

## 2022-06-17 NOTE — Plan of Care (Signed)
  Problem: Activity: Goal: Ability to tolerate increased activity will improve Outcome: Progressing   Problem: Clinical Measurements: Goal: Ability to maintain a body temperature in the normal range will improve Outcome: Progressing   Problem: Respiratory: Goal: Ability to maintain adequate ventilation will improve Outcome: Progressing Goal: Ability to maintain a clear airway will improve Outcome: Progressing   Problem: Education: Goal: Knowledge of General Education information will improve Description: Including pain rating scale, medication(s)/side effects and non-pharmacologic comfort measures Outcome: Progressing   Problem: Health Behavior/Discharge Planning: Goal: Ability to manage health-related needs will improve Outcome: Progressing   Problem: Clinical Measurements: Goal: Ability to maintain clinical measurements within normal limits will improve Outcome: Progressing Goal: Will remain free from infection Outcome: Progressing Goal: Diagnostic test results will improve Outcome: Progressing Goal: Respiratory complications will improve Outcome: Progressing Goal: Cardiovascular complication will be avoided Outcome: Progressing   Problem: Activity: Goal: Risk for activity intolerance will decrease Outcome: Progressing   Problem: Nutrition: Goal: Adequate nutrition will be maintained Outcome: Progressing   Problem: Coping: Goal: Level of anxiety will decrease Outcome: Progressing   Problem: Elimination: Goal: Will not experience complications related to bowel motility Outcome: Progressing Goal: Will not experience complications related to urinary retention Outcome: Progressing   Problem: Pain Managment: Goal: General experience of comfort will improve Outcome: Progressing   Problem: Safety: Goal: Ability to remain free from injury will improve Outcome: Progressing   Problem: Skin Integrity: Goal: Risk for impaired skin integrity will decrease Outcome:  Progressing   Problem: Education: Goal: Ability to describe self-care measures that may prevent or decrease complications (Diabetes Survival Skills Education) will improve Outcome: Progressing Goal: Individualized Educational Video(s) Outcome: Progressing   Problem: Coping: Goal: Ability to adjust to condition or change in health will improve Outcome: Progressing   Problem: Fluid Volume: Goal: Ability to maintain a balanced intake and output will improve Outcome: Progressing   Problem: Health Behavior/Discharge Planning: Goal: Ability to identify and utilize available resources and services will improve Outcome: Progressing Goal: Ability to manage health-related needs will improve Outcome: Progressing   Problem: Metabolic: Goal: Ability to maintain appropriate glucose levels will improve Outcome: Progressing   Problem: Nutritional: Goal: Maintenance of adequate nutrition will improve Outcome: Progressing Goal: Progress toward achieving an optimal weight will improve Outcome: Progressing   Problem: Skin Integrity: Goal: Risk for impaired skin integrity will decrease Outcome: Progressing   Problem: Tissue Perfusion: Goal: Adequacy of tissue perfusion will improve Outcome: Progressing   

## 2022-06-17 NOTE — TOC Progression Note (Addendum)
Transition of Care [facility]) - Progression Note    Patient Details  Name: [name] MRN: [id number] Date of Birth: 1941/05/01  Transition [facility] CM/SW Contact  Tom-Johnson, Renea Ee, RN Phone Number: 06/17/2022, 2:38 PM  Clinical Narrative:     CM spoke with patient and daughter, [name] at bedside about home health recommendations. [name] states patient was active with [facility] over a year ago and would like to resume services at discharge. CM called and left a secure message, awaiting return call.  BSC ordered from [employer] delivered at bedside and daughter, [name] to take home with her.  CM will continue to follow as patient progresses with care towards discharge.   15:15- CM received a return call from Amy with [facility] and she states they are one week out on new patient admissions at this time. Patient and daughter notified and request to send referral to [facility]. Cory voiced acceptance and info on AVS. CM will continue to follow.     Expected Discharge Plan: Home/Self Care Barriers to Discharge: Continued Medical Work up  Expected Discharge Plan and Services In-house Referral: Clinical Social Work     Living arrangements for the past 2 months: Single Family Home Expected Discharge Date: 06/12/22                         [facility] Arranged: PT   Date [place]: 06/12/22       Social Determinants of Health ([place]) Interventions [place]: No Food Insecurity (06/13/2022)  Housing: Low Risk  (06/13/2022)  Transportation Needs: No Transportation Needs (06/13/2022)  Utilities: Not At Risk (06/13/2022)  Tobacco Use: Low Risk  (06/10/2022)    Readmission Risk Interventions    06/12/2022    3:57 PM 06/10/2022   11:01 AM 01/17/2020    9:17 AM  Readmission Risk Prevention Plan  Transportation Screening Complete Complete Complete  PCP or Specialist Appt within 5-7 Days   Not Complete  Not Complete comments   pending  disposition  Home Care Screening   Complete  Medication Review (RN CM)   Referral to Pharmacy  [facility] or [place] Complete Complete   Social Work Consult for [place] Planning/Counseling Complete Complete   Palliative Care Screening Not Applicable Not Applicable   Medication Review Press photographer) Complete Complete

## 2022-06-17 NOTE — Progress Notes (Signed)
    Durable Medical Equipment  (From admission, onward)           Start     Ordered   06/17/22 1444  For home use only DME Bedside commode  Once       Comments: Pt's generalized weakness and decreased activity tolerance necessitate recommendation for 3n1 to use as shower seat at home for safe completion of full showering/bathing tasks.  Question:  Patient needs a bedside commode to treat with the following condition  Answer:  Weakness   06/17/22 1444

## 2022-06-17 NOTE — Progress Notes (Signed)
Pharmacy Antibiotic Note  [name] is a 82 y.o. female admitted on 06/09/2022 with pneumonia.  Pharmacy has been consulted for vancomycin and cefepime dosing.  HD schedules are off d/t holidays and HD census. Patient is typically a MWF, but last HD was 12/31, next HD planned 1/3, back on MWF schedule.   Plan: -Cefepime 1 gram IV every 24 hours (doses to be given after HD on HD days) -Check pre-HD random vancomycin level -Vancomycin 750 mg IV every MWF post HD -Monitor clinical progress, cultures/sensitivities, renal function, abx plan, Vancomycin levels as indicated    Height: '5\' 7"'$  (170.2 cm) Weight: 68.8 kg (151 lb 10.8 oz) IBW/kg (Calculated) : 61.6  Temp (24hrs), Avg:98 F (36.7 C), Min:97.7 F (36.5 C), Max:98.4 F (36.9 C)  Recent Labs  Lab 06/11/22 0345 06/11/22 2019 06/12/22 0327 06/13/22 0157 06/14/22 0414 06/15/22 0331 06/15/22 1811 06/16/22 1152 06/17/22 0541 06/17/22 0645  WBC 12.6*   < > 13.9* 12.1* 14.5* 15.7*  --   --  12.9*  --   CREATININE 4.54*  --   --   --  6.96*  --  6.02*  --   --  5.25*  VANCORANDOM  --   --   --   --   --   --   --  33  --   --    < > = values in this interval not displayed.     Estimated Creatinine Clearance: 8 mL/min (A) (by C-G formula based on SCr of 5.25 mg/dL (H)).    Allergies  Allergen Reactions   Ambien [Zolpidem] Other (See Comments)    Hallucinations    Reglan [Metoclopramide] Other (See Comments)    "makes me crazy"    Antimicrobials this admission: 12/25 vancomycin >>  12/25 cefepime >>   Dose adjustments this admission:  Microbiology results: 12/25 Bcx: ngF 12/25 Sputum Cx: ngF 12/26 Pleural fluid Cx: ngF MRSA PCR: neg   Thank you for allowing [place] to participate in this patients care. Jens Som, PharmD 06/17/2022 12:23 PM  **Pharmacist phone directory can be found on [email or web address] listed under [place]**

## 2022-06-18 ENCOUNTER — Inpatient Hospital Stay (HOSPITAL_COMMUNITY): Payer: 59

## 2022-06-18 DIAGNOSIS — J90 Pleural effusion, not elsewhere classified: Secondary | ICD-10-CM | POA: Diagnosis not present

## 2022-06-18 LAB — CBC
HCT: 24.4 % — ABNORMAL LOW (ref 36.0–46.0)
Hemoglobin: 8.5 g/dL — ABNORMAL LOW (ref 12.0–15.0)
MCH: 31.6 pg (ref 26.0–34.0)
MCHC: 34.8 g/dL (ref 30.0–36.0)
MCV: 90.7 fL (ref 80.0–100.0)
Platelets: 156 10*3/uL (ref 150–400)
RBC: 2.69 MIL/uL — ABNORMAL LOW (ref 3.87–5.11)
RDW: 16.8 % — ABNORMAL HIGH (ref 11.5–15.5)
WBC: 10.9 10*3/uL — ABNORMAL HIGH (ref 4.0–10.5)
nRBC: 0 % (ref 0.0–0.2)

## 2022-06-18 LAB — GLUCOSE, CAPILLARY
Glucose-Capillary: 164 mg/dL — ABNORMAL HIGH (ref 70–99)
Glucose-Capillary: 215 mg/dL — ABNORMAL HIGH (ref 70–99)
Glucose-Capillary: 270 mg/dL — ABNORMAL HIGH (ref 70–99)

## 2022-06-18 LAB — VANCOMYCIN, RANDOM: Vancomycin Rm: 35 ug/mL

## 2022-06-18 MED ORDER — FENTANYL CITRATE (PF) 100 MCG/2ML IJ SOLN
INTRAMUSCULAR | Status: AC
  Filled 2022-06-18: qty 4

## 2022-06-18 MED ORDER — HEPARIN SODIUM (PORCINE) 1000 UNIT/ML DIALYSIS: 1000.0000 [IU] | INTRAMUSCULAR | Status: DC | PRN

## 2022-06-18 MED ORDER — MIDAZOLAM HCL 2 MG/2ML IJ SOLN
INTRAMUSCULAR | Status: AC | PRN
  Administered 2022-06-18: .5 mg via INTRAVENOUS

## 2022-06-18 MED ORDER — MIDAZOLAM HCL 2 MG/2ML IJ SOLN
INTRAMUSCULAR | Status: AC
  Filled 2022-06-18: qty 4

## 2022-06-18 MED ORDER — FENTANYL CITRATE (PF) 100 MCG/2ML IJ SOLN
INTRAMUSCULAR | Status: AC | PRN
  Administered 2022-06-18: 25 ug via INTRAVENOUS
  Administered 2022-06-18: 12.5 ug via INTRAVENOUS

## 2022-06-18 MED ORDER — ALTEPLASE 2 MG IJ SOLR: 2.0000 mg | Freq: Once | INTRAMUSCULAR | Status: DC | PRN

## 2022-06-18 MED ORDER — ANTICOAGULANT SODIUM CITRATE 4% (200MG/5ML) IV SOLN: 5.0000 mL | Status: DC | PRN

## 2022-06-18 NOTE — Procedures (Signed)
Vascular and Interventional Radiology Procedure Note  Patient: [name] DOB: 01/12/1941 Medical Record Number: [id number] Note Date/Time: 06/18/22 2:42 PM   Performing Physician: Michaelle Birks, MD Assistant(s): None  Diagnosis: Loculated pleural effusion  Procedure: LEFT THORACOSTOMY DRAINAGE CATHETER PLACEMENT   Anesthesia: Conscious Sedation Complications: None Estimated Blood Loss:  0 mL Specimens: Sent for Gram Stain, Aerobe Culture, and Anerobe Culture  Findings:  Successful CT-guided placement of 12 F catheter into LEFT chest.  Plan:  - L thoracostomy tube to -20 cm H20 suction  See detailed procedure note with images in PACS. The patient tolerated the procedure well without incident or complication and was returned to Floor Bed in stable condition.    Michaelle Birks, MD Vascular and Interventional Radiology Specialists [facility] Radiology   Pager. [place]

## 2022-06-18 NOTE — Progress Notes (Addendum)
PROGRESS NOTE   [name]  KGM:[id number] DOB: 05/26/1941 DOA: 06/09/2022  PCP: [name], MD   Brief Narrative:  This 82 yrs old female with history of ESRD, diabetes mellitus type 2, GERD, hypertension, stroke. Patient presented secondary to shortness of breath and found to have recurrent pneumonia with associated left sided pleural effusion. Empiric antibiotics initiated and patient transferred to [facility] for consideration of chest tube placement. During admission, patient developed hospital delirium.  Patient continued on hemodialysis.  IR is consulted patient is scheduled to have left thoracostomy drainage catheter placement  Assessment & Plan:   Principal Problem:   Pleural effusion Active Problems:   Essential hypertension   Thrombocytopenia (HCC)   ESRD (end stage renal disease) ([place])   DMII (diabetes mellitus, type 2) (HCC)   GERD (gastroesophageal reflux disease)   CAP (community acquired pneumonia)   Delirium  Pleural effusion: There is a concern for parapneumonic effusion. IR consulted and performed thoracentesis, yielding 1.2 L of serosanguinous fluid, followed by repeat thoracentesis with minimal amount of serosanguinous fluid and likely clot. Patient transferred to [facility] for pulmonology consult and consideration of chest tube. Concern for hemothorax. CT surgery has recommended CT guided chest tube. IR states chest tube cannot be placed until 06/18/2022. Patient remains on room air.  Patient is scheduled for chest tube insertion by IR today.   Delirium: In setting of history of multiple vascular injuries and current hospitalization. Likely hospital related delirium. Patient is also on Cefepime which could also be the culprit. Delirium improved. CT head unremarkable for acute process. -Delirium precautions -Continue Seroquel 12.5 mg qHS to help with sleep. -She appears back to baseline mental status.   Community-acquired  pneumonia: Patient was recently treated with Unasyn and azithromycin, prior to admission. On admission, patient was broadened to Vancomycin and Cefepime. Procalcitonin mildly elevated at 0.96. Afebrile with leukocytosis. Complicated by left pleural effusion. MRSA PCR negative. -Continue Vancomycin and Zosyn   GERD: Continue Protonix.   Diabetes mellitus type 2: Continue SSI   ESRD (end stage renal disease) [facility]) Nephrology consulted for hemodialysis while inpatient. Continue hemodialysis as per schedule.   Thrombocytopenia (HCC) Mild and likely reactive, secondary to infection. Stable.   Essential hypertension Continue labetalol   Lethargy-resolved as of 06/17/2022. Likely secondary to Ativan given night prior to late session of HD. -Avoid benzodiazepines   Chest pain-resolved as of 06/13/2022 Likely secondary to pulmonary edema. Resolved.   Severe uncontrolled hypertension-resolved as of 06/13/2022 Patient with blood pressure up to 214/94, treated with Hydralazine IV and hemodialysis. Resolved.    DVT prophylaxis: Heparin sq Code Status: Full code. Family Communication:No family at bed side. Disposition Plan:    Status is: Inpatient Remains inpatient appropriate because: Admitted for large pleural effusion in the setting of ESRD on hemodialysis.  IR was consulted for possible chest tube insertion    Consultants:  Nephrology Pulmonology Cardiothoracic surgery  Procedures: Left thoracocentesis 12/28 Antimicrobials:  Vancomycin Cefepime Zosyn IV  Subjective: Patient was seen and examined at bedside.  Overnight events noted. Patient was having hemodialysis and states she is going to have a chest tube after this dialysis.  Objective: Vitals:   06/18/22 1410 06/18/22 1415 06/18/22 1420 06/18/22 1425  BP: (!) 157/49 (!) 156/58 (!) 164/53 (!) 158/55  Pulse: 92 91 90 90  Resp: '14 20 16 16  '$ Temp:      TempSrc:      SpO2: 100% 100% 99% 100%  Weight:  Height:         Intake/Output Summary (Last 24 hours) at 06/18/2022 1448 Last data filed at 06/18/2022 1421 Gross per 24 hour  Intake 150.04 ml  Output 30 ml  Net 120.04 ml   Filed Weights   06/14/22 1058 06/15/22 0532 06/15/22 2238  Weight: 70.3 kg 71.7 kg 68.8 kg    Examination:  General exam: Appears comfortable, not in any acute distress.  Deconditioned Respiratory system: Decreased breath sounds, respiratory for normal, RR 14 Cardiovascular system: S1 & S2 heard, regular rate and rhythm, no murmur. Gastrointestinal system: Abdomen is soft, non tender, non distended, BS+ Central nervous system: Alert and oriented x 3. No focal neurological deficits. Extremities: No edema, no cyanosis, no clubbing Skin: No rashes, lesions or ulcers Psychiatry: Judgement and insight appear normal. Mood & affect appropriate.     Data Reviewed: I have personally reviewed following labs and imaging studies  CBC: Recent Labs  Lab 06/12/22 0327 06/13/22 0157 06/14/22 0414 06/15/22 0331 06/17/22 0541 06/18/22 0419  WBC 13.9* 12.1* 14.5* 15.7* 12.9* 10.9*  NEUTROABS 11.2* 9.0* 11.1* 12.5*  --   --   HGB 8.1* 8.3* 7.6* 8.6* 8.6* 8.5*  HCT 24.1* 24.9* 21.5* 25.5* 25.8* 24.4*  MCV 93.4 96.5 92.3 93.1 94.5 90.7  PLT 132* 132* 122* 136* 178 829   Basic Metabolic Panel: Recent Labs  Lab 06/12/22 0327 06/13/22 0157 06/14/22 0414 06/15/22 0331 06/15/22 1811 06/17/22 0645  NA  --   --  136  --  134* 134*  K  --   --  4.1  --  4.5 4.6  CL  --   --  95*  --  95* 94*  CO2  --   --  26  --  25 27  GLUCOSE  --   --  205*  --  116* 139*  BUN  --   --  32*  --  26* 20  CREATININE  --   --  6.96*  --  6.02* 5.25*  CALCIUM  --   --  7.7*  --  7.7* 7.9*  MG 1.7 1.6* 1.8 1.8  --   --   PHOS  --   --   --   --  3.1  --    GFR: Estimated Creatinine Clearance: 8 mL/min (A) (by C-G formula based on SCr of 5.25 mg/dL (H)). Liver Function Tests: Recent Labs  Lab 06/15/22 1811  ALBUMIN 2.1*   No results  for input(s): "LIPASE", "AMYLASE" in the last 168 hours. No results for input(s): "AMMONIA" in the last 168 hours. Coagulation Profile: No results for input(s): "INR", "PROTIME" in the last 168 hours. Cardiac Enzymes: No results for input(s): "CKTOTAL", "CKMB", "CKMBINDEX", "TROPONINI" in the last 168 hours. BNP (last 3 results) No results for input(s): "PROBNP" in the last 8760 hours. HbA1C: No results for input(s): "HGBA1C" in the last 72 hours. CBG: Recent Labs  Lab 06/17/22 1225 06/17/22 1412 06/17/22 1600 06/17/22 2118 06/18/22 0719  GLUCAP 90 171* 177* 174* 164*   Lipid Profile: No results for input(s): "CHOL", "HDL", "LDLCALC", "TRIG", "CHOLHDL", "LDLDIRECT" in the last 72 hours. Thyroid Function Tests: No results for input(s): "TSH", "T4TOTAL", "FREET4", "T3FREE", "THYROIDAB" in the last 72 hours. Anemia Panel: No results for input(s): "VITAMINB12", "FOLATE", "FERRITIN", "TIBC", "IRON", "RETICCTPCT" in the last 72 hours. Sepsis Labs: No results for input(s): "PROCALCITON", "LATICACIDVEN" in the last 168 hours.  Recent Results (from the past 240 hour(s))  Expectorated Sputum Assessment w Gram  Stain, Rflx to Resp Cult     Status: None   Collection Time: 06/09/22 12:04 AM   Specimen: Expectorated Sputum  Result Value Ref Range Status   Specimen Description EXPECTORATED SPUTUM  Final   Special Requests NONE  Final   Sputum evaluation   Final    Sputum specimen not acceptable for testing.  Please recollect.   CALL HARDIN,S. ON 06/10/22 '@0228'$  BY [facility] Performed at [facility], [address], [place], [place] [address]    Report Status 06/10/2022 FINAL  Final  Resp panel by RT-PCR (RSV, Flu A&B, Covid) Anterior Nasal Swab     Status: None   Collection Time: 06/09/22 10:17 PM   Specimen: Anterior Nasal Swab  Result Value Ref Range Status   SARS Coronavirus 2 by RT PCR NEGATIVE NEGATIVE Final    Comment: (NOTE) SARS-CoV-2 target nucleic acids are NOT DETECTED.  The  SARS-CoV-2 RNA is generally detectable in upper respiratory specimens during the acute phase of infection. The lowest concentration of SARS-CoV-2 viral copies this assay can detect is 138 copies/mL. A negative result does not preclude SARS-Cov-2 infection and should not be used as the sole basis for treatment or other patient management decisions. A negative result may occur with  improper specimen collection/handling, submission of specimen other than nasopharyngeal swab, presence of viral mutation(s) within the areas targeted by this assay, and inadequate number of viral copies(<138 copies/mL). A negative result must be combined with clinical observations, patient history, and epidemiological information. The expected result is Negative.  Fact Sheet for Patients:  EntrepreneurPulse.com.au  Fact Sheet for Healthcare Providers:  IncredibleEmployment.be  This test is no t yet approved or cleared by the [place] FDA and  has been authorized for detection and/or diagnosis of SARS-CoV-2 by FDA under an Emergency Use Authorization (EUA). This EUA will remain  in effect (meaning this test can be used) for the duration of the COVID-19 declaration under Section 564(b)(1) of the Act, 21 U.S.C.section 360bbb-3(b)(1), unless the authorization is terminated  or revoked sooner.       Influenza A by PCR NEGATIVE NEGATIVE Final   Influenza B by PCR NEGATIVE NEGATIVE Final    Comment: (NOTE) The Xpert Xpress SARS-CoV-2/FLU/RSV plus assay is intended as an aid in the diagnosis of influenza from Nasopharyngeal swab specimens and should not be used as a sole basis for treatment. Nasal washings and aspirates are unacceptable for Xpert Xpress SARS-CoV-2/FLU/RSV testing.  Fact Sheet for Patients: EntrepreneurPulse.com.au  Fact Sheet for Healthcare Providers: IncredibleEmployment.be  This test is not yet approved or  cleared by the [place] FDA and has been authorized for detection and/or diagnosis of SARS-CoV-2 by FDA under an Emergency Use Authorization (EUA). This EUA will remain in effect (meaning this test can be used) for the duration of the COVID-19 declaration under Section 564(b)(1) of the Act, 21 U.S.C. section 360bbb-3(b)(1), unless the authorization is terminated or revoked.     Resp Syncytial Virus by PCR NEGATIVE NEGATIVE Final    Comment: (NOTE) Fact Sheet for Patients: EntrepreneurPulse.com.au  Fact Sheet for Healthcare Providers: IncredibleEmployment.be  This test is not yet approved or cleared by the [place] FDA and has been authorized for detection and/or diagnosis of SARS-CoV-2 by FDA under an Emergency Use Authorization (EUA). This EUA will remain in effect (meaning this test can be used) for the duration of the COVID-19 declaration under Section 564(b)(1) of the Act, 21 U.S.C. section 360bbb-3(b)(1), unless the authorization is terminated or revoked.  Performed at  [address] [place], [place] [address]   Culture, blood (routine x 2) Call MD if unable to obtain prior to antibiotics being given     Status: None   Collection Time: 06/09/22 10:48 PM   Specimen: BLOOD LEFT ARM  Result Value Ref Range Status   Specimen Description BLOOD LEFT ARM BOTTLES DRAWN AEROBIC AND ANAEROBIC  Final   Special Requests Blood Culture adequate volume  Final   Culture   Final    NO GROWTH 5 DAYS Performed at [facility], [address], [place], [place] [address]    Report Status 06/14/2022 FINAL  Final  Culture, blood (routine x 2) Call MD if unable to obtain prior to antibiotics being given     Status: None   Collection Time: 06/09/22 10:49 PM   Specimen: Site Not Specified; Blood  Result Value Ref Range Status   Specimen Description   Final    SITE NOT SPECIFIED BOTTLES DRAWN AEROBIC AND ANAEROBIC   Special Requests  Blood Culture adequate volume  Final   Culture   Final    NO GROWTH 5 DAYS Performed at [facility], [address], [place], [place] [address]    Report Status 06/14/2022 FINAL  Final  Culture, body fluid w Gram Stain-bottle     Status: None   Collection Time: 06/10/22 11:35 AM   Specimen: Pleura  Result Value Ref Range Status   Specimen Description PLEURAL  Final   Special Requests BOTTLES DRAWN AEROBIC AND ANAEROBIC 10CC  Final   Culture   Final    NO GROWTH 5 DAYS Performed at [facility], [address] [place], [place] [address]    Report Status 06/15/2022 FINAL  Final  Gram stain     Status: None   Collection Time: 06/10/22 11:35 AM   Specimen: Pleura  Result Value Ref Range Status   Specimen Description PLEURAL  Final   Special Requests BOTTLES DRAWN AEROBIC AND ANAEROBIC 10 CC  Final   Gram Stain   Final    NO ORGANISMS SEEN WBC PRESENT,BOTH PMN AND MONONUCLEAR CYTOSPIN SMEAR Performed at [facility], [address], [place], [place] [address]    Report Status 06/10/2022 FINAL  Final  MRSA Next Gen by PCR, Nasal     Status: None   Collection Time: 06/10/22  5:00 PM   Specimen: Nasal Mucosa; Nasal Swab  Result Value Ref Range Status   MRSA by PCR Next Gen NOT DETECTED NOT DETECTED Final    Comment: (NOTE) The GeneXpert MRSA Assay (FDA approved for NASAL specimens only), is one component of a comprehensive MRSA colonization surveillance program. It is not intended to diagnose MRSA infection nor to guide or monitor treatment for MRSA infections. Test performance is not FDA approved in patients less than 61 years old. Performed at [facility], [address], [place], [place] [address]    Radiology Studies: No results found.  Scheduled Meds:  aspirin  650 mg Oral Daily   Chlorhexidine Gluconate Cloth  6 each Topical Q0600   Chlorhexidine Gluconate Cloth  6 each Topical Q0600   [START ON 06/23/2022] darbepoetin (ARANESP) injection - DIALYSIS  60 mcg Subcutaneous  Q Mon-1800   dextromethorphan-guaiFENesin  1 tablet Oral BID   fentaNYL       hydrocerin   Topical BID   insulin aspart  0-15 Units Subcutaneous TID WC   insulin aspart  0-5 Units Subcutaneous QHS   labetalol  200 mg Oral BID  midazolam       pantoprazole  40 mg Oral Daily   QUEtiapine  12.5 mg Oral QHS   sevelamer carbonate  800 mg Oral BID AC   vancomycin variable dose per unstable renal function (pharmacist dosing)   Does not apply See admin instructions   Continuous Infusions:  piperacillin-tazobactam (ZOSYN)  IV Stopped (06/18/22 8978)   vancomycin       LOS: 9 days    Time spent: 50 mins    Fauna Neuner, MD [facility]   If 7PM-7AM, please contact night-coverage

## 2022-06-18 NOTE — Progress Notes (Signed)
[facility] NEPHROLOGY PROGRESS NOTE  Assessment/ Plan: Dialyzes at [facility] MWF  4 hours  EDW 75.5. HD Bath 2K, Dialyzer unknown, Heparin no. Access AVF right-  has post bleeding a lot.  Calcitriol 1 mcg q tx   # SOB/pleural effusion- markedly improved after HD.  off of supplemental oxygen.  Has L sided loculated pleural effusion, s/p thora 100 mL off.  Plan for chest tube placement by IR today.  #ESRD - On schedule MWF, receiving dialysis today, tolerating well.  UF around 2 L if tolerated.  # HCAP- on vanc/ cefepime.  #HTN / volume: BP improved on labetalol.  Volume management with HD.  # Anemia- esa to start 12/31, transfuse prn.  #Delirium/agitation- thought to be secondary to cefepime which ahs been d/c'd. CTH without acute changes. Per primary.  Subjective: Seen and examined at dialysis  unit.  Tolerating HD well.  Denies nausea, vomiting, chest pain, shortness of breath.  Plan for IR procedure today.  Objective Vital signs in last 24 hours: Vitals:   06/18/22 0845 06/18/22 0900 06/18/22 1000 06/18/22 1130  BP: (!) 130/51 (!) 160/57 (!) 146/64 (!) 135/58  Pulse: 80 81 73 75  Resp: '12 13 14 12  '$ Temp:      TempSrc:      SpO2: 99% 99% 99% 99%  Weight:      Height:       Weight change:   Intake/Output Summary (Last 24 hours) at 06/18/2022 1240 Last data filed at 06/18/2022 1194 Gross per 24 hour  Intake 150.04 ml  Output 0 ml  Net 150.04 ml        Labs: RENAL PANEL Recent Labs    05/01/22 2235 05/09/22 0659 06/01/22 1910 06/02/22 0740 06/03/22 0329 06/04/22 0344 06/09/22 2000 06/10/22 0353 06/11/22 0345 06/12/22 0327 06/13/22 0157 06/14/22 0414 06/15/22 0331 06/15/22 1811 06/17/22 0645  NA 140   < > 136 137 136 136 139 139 137  --   --  136  --  134* 134*  K 3.7   < > 3.5 4.3 3.0* 3.2* 3.5 3.9 3.7  --   --  4.1  --  4.5 4.6  CL 100   < > 96* 96* 95* 96* 99 101 97*  --   --  95*  --  95* 94*  CO2 26   < > '28 28 28 28 28 25 28   '$ --   --  26  --  25 27  GLUCOSE 275*   < > 185* 220* 132* 146* 187* 184* 210*  --   --  205*  --  116* 139*  BUN 27*   < > 36* 41* 21 34* 28* 31* 17  --   --  32*  --  26* 20  CREATININE 7.01*   < > 7.26* 8.11* 4.68* 6.42* 7.43* 7.73* 4.54*  --   --  6.96*  --  6.02* 5.25*  CALCIUM 8.1*   < > 8.2* 8.0* 7.8* 7.9* 8.3* 7.9* 7.7*  --   --  7.7*  --  7.7* 7.9*  MG  --   --   --  2.2  --  1.9  --  2.1 1.7 1.7 1.6* 1.8 1.8  --   --   PHOS  --   --   --  4.6  --   --   --   --  2.2*  --   --   --   --  3.1  --  ALBUMIN 3.3*  --   --  3.0*  --   --  3.1* 2.8* 2.5*  --   --   --   --  2.1*  --    < > = values in this interval not displayed.      Liver Function Tests: Recent Labs  Lab 06/15/22 1811  ALBUMIN 2.1*    No results for input(s): "LIPASE", "AMYLASE" in the last 168 hours. No results for input(s): "AMMONIA" in the last 168 hours. CBC: Recent Labs    06/13/22 0157 06/14/22 0414 06/15/22 0331 06/15/22 0719 06/17/22 0541 06/18/22 0419  HGB 8.3* 7.6* 8.6*  --  8.6* 8.5*  MCV 96.5 92.3 93.1  --  94.5 90.7  FERRITIN  --   --  1,418*  --   --   --   TIBC  --   --   --  136*  --   --   IRON  --   --   --  31  --   --      Cardiac Enzymes: No results for input(s): "CKTOTAL", "CKMB", "CKMBINDEX", "TROPONINI" in the last 168 hours. CBG: Recent Labs  Lab 06/17/22 1225 06/17/22 1412 06/17/22 1600 06/17/22 2118 06/18/22 0719  GLUCAP 90 171* 177* 174* 164*     Iron Studies:  No results for input(s): "IRON", "TIBC", "TRANSFERRIN", "FERRITIN" in the last 72 hours.  Studies/Results: No results found.  Medications: Infusions:  anticoagulant sodium citrate     piperacillin-tazobactam (ZOSYN)  IV Stopped (06/18/22 3009)   vancomycin      Scheduled Medications:  aspirin  650 mg Oral Daily   Chlorhexidine Gluconate Cloth  6 each Topical Q0600   Chlorhexidine Gluconate Cloth  6 each Topical Q0600   [START ON 06/23/2022] darbepoetin (ARANESP) injection - DIALYSIS  60 mcg  Subcutaneous Q Mon-1800   dextromethorphan-guaiFENesin  1 tablet Oral BID   hydrocerin   Topical BID   insulin aspart  0-15 Units Subcutaneous TID WC   insulin aspart  0-5 Units Subcutaneous QHS   labetalol  200 mg Oral BID   pantoprazole  40 mg Oral Daily   QUEtiapine  12.5 mg Oral QHS   sevelamer carbonate  800 mg Oral BID AC   vancomycin variable dose per unstable renal function (pharmacist dosing)   Does not apply See admin instructions    have reviewed scheduled and prn medications.  Physical Exam: General:NAD, comfortable Heart:RRR, s1s2 nl Lungs:clear b/l, no crackle Abdomen:soft, Non-tender, non-distended Extremities:No edema Dialysis Access: Right upper extremity AV fistula has thrill and bruit present  [name] [name] 06/18/2022,12:40 PM  LOS: 9 days

## 2022-06-18 NOTE — Progress Notes (Addendum)
Patient received to unit. Chest tube in place. Orders acknowledged and implemented.Patient alert and oriented. No complaints of pain. VSS.

## 2022-06-18 NOTE — Progress Notes (Signed)
Pt receives out-pt HD at [facility] on MWF. Pt has a 6:00 am chair time. Will assist as needed.   Melven Sartorius Renal Navigator  [phone]

## 2022-06-18 NOTE — Progress Notes (Signed)
Pharmacy Antibiotic Note  [name] is a 82 y.o. female admitted on 06/09/2022 with pneumonia.  Pharmacy has been consulted for vancomycin and cefepime dosing.  HD schedules are off d/t holidays and HD census. Patient is typically a MWF, but last HD was 12/31, next HD planned 1/3, back on MWF schedule.   Per-HD random level 35 this AM, confirming no extra HD renal clearance of drug since last HD.   Plan: -Cefepime>>Zosyn 2.25gm Iv q8h per MD -Continue Vancomycin 750 mg IV every MWF post HD -Monitor clinical progress, cultures/sensitivities, renal function, abx plan, Vancomycin levels as indicated    Height: '5\' 7"'$  (170.2 cm) Weight: 68.8 kg (151 lb 10.8 oz) IBW/kg (Calculated) : 61.6  Temp (24hrs), Avg:97.9 F (36.6 C), Min:97.7 F (36.5 C), Max:98.5 F (36.9 C)  Recent Labs  Lab 06/13/22 0157 06/14/22 0414 06/15/22 0331 06/15/22 1811 06/16/22 1152 06/17/22 0541 06/17/22 0645 06/18/22 0419  WBC 12.1* 14.5* 15.7*  --   --  12.9*  --  10.9*  CREATININE  --  6.96*  --  6.02*  --   --  5.25*  --   VANCORANDOM  --   --   --   --  33  --   --  35     Estimated Creatinine Clearance: 8 mL/min (A) (by C-G formula based on SCr of 5.25 mg/dL (H)).    Allergies  Allergen Reactions   Ambien [Zolpidem] Other (See Comments)    Hallucinations    Reglan [Metoclopramide] Other (See Comments)    "makes me crazy"    Antimicrobials this admission: 12/25 vancomycin >>  12/25 cefepime >> 12/30 12/30 Zosyn >>  Dose adjustments this admission:  Microbiology results: 12/25 Bcx: ngF 12/25 Sputum Cx: ngF 12/26 Pleural fluid Cx: ngF MRSA PCR: neg   Johnthomas Lader A. Levada Dy, PharmD, BCPS, FNKF Clinical Pharmacist [place] Please utilize Amion for appropriate phone number to reach the unit pharmacist ([place])

## 2022-06-19 ENCOUNTER — Inpatient Hospital Stay (HOSPITAL_COMMUNITY): Payer: 59

## 2022-06-19 DIAGNOSIS — J90 Pleural effusion, not elsewhere classified: Secondary | ICD-10-CM | POA: Diagnosis not present

## 2022-06-19 LAB — BASIC METABOLIC PANEL
Anion gap: 14 (ref 5–15)
BUN: 16 mg/dL (ref 8–23)
CO2: 27 mmol/L (ref 22–32)
Calcium: 7.8 mg/dL — ABNORMAL LOW (ref 8.9–10.3)
Chloride: 94 mmol/L — ABNORMAL LOW (ref 98–111)
Creatinine, Ser: 4.22 mg/dL — ABNORMAL HIGH (ref 0.44–1.00)
GFR, Estimated: 10 mL/min — ABNORMAL LOW (ref >60)
Glucose, Bld: 259 mg/dL — ABNORMAL HIGH (ref 70–99)
Potassium: 3.9 mmol/L (ref 3.5–5.1)
Sodium: 135 mmol/L (ref 135–145)

## 2022-06-19 LAB — CBC
HCT: 26.4 % — ABNORMAL LOW (ref 36.0–46.0)
HCT: 25.3 % — ABNORMAL LOW (ref 36.0–46.0)
Hemoglobin: 9.2 g/dL — ABNORMAL LOW (ref 12.0–15.0)
Hemoglobin: 8.6 g/dL — ABNORMAL LOW (ref 12.0–15.0)
MCH: 32.2 pg (ref 26.0–34.0)
MCH: 31.7 pg (ref 26.0–34.0)
MCHC: 34.8 g/dL (ref 30.0–36.0)
MCHC: 34 g/dL (ref 30.0–36.0)
MCV: 92.3 fL (ref 80.0–100.0)
MCV: 93.4 fL (ref 80.0–100.0)
Platelets: 165 10*3/uL (ref 150–400)
Platelets: 163 K/uL (ref 150–400)
RBC: 2.86 MIL/uL — ABNORMAL LOW (ref 3.87–5.11)
RBC: 2.71 MIL/uL — ABNORMAL LOW (ref 3.87–5.11)
RDW: 17 % — ABNORMAL HIGH (ref 11.5–15.5)
RDW: 17.2 % — ABNORMAL HIGH (ref 11.5–15.5)
WBC: 8.1 10*3/uL (ref 4.0–10.5)
WBC: 11.5 K/uL — ABNORMAL HIGH (ref 4.0–10.5)
nRBC: 0 % (ref 0.0–0.2)
nRBC: 0 % (ref 0.0–0.2)

## 2022-06-19 LAB — PHOSPHORUS: Phosphorus: 3.1 mg/dL (ref 2.5–4.6)

## 2022-06-19 LAB — GLUCOSE, CAPILLARY
Glucose-Capillary: 213 mg/dL — ABNORMAL HIGH (ref 70–99)
Glucose-Capillary: 188 mg/dL — ABNORMAL HIGH (ref 70–99)
Glucose-Capillary: 150 mg/dL — ABNORMAL HIGH (ref 70–99)
Glucose-Capillary: 198 mg/dL — ABNORMAL HIGH (ref 70–99)

## 2022-06-19 LAB — MAGNESIUM: Magnesium: 1.7 mg/dL (ref 1.7–2.4)

## 2022-06-19 MED ORDER — INSULIN GLARGINE-YFGN 100 UNIT/ML ~~LOC~~ SOLN
10.0000 [IU] | Freq: Every day | SUBCUTANEOUS | Status: DC
  Administered 2022-06-19 – 2022-06-24 (×6): 10 [IU] via SUBCUTANEOUS
  Filled 2022-06-19 (×6): qty 0.1

## 2022-06-19 MED ORDER — SODIUM CHLORIDE 0.9% FLUSH
10.0000 mL | Freq: Three times a day (TID) | INTRAVENOUS | Status: DC
  Administered 2022-06-19 – 2022-06-22 (×11): 10 mL via INTRAPLEURAL

## 2022-06-19 MED ORDER — SODIUM CHLORIDE (PF) 0.9 % IJ SOLN
10.0000 mg | Freq: Once | INTRAMUSCULAR | Status: AC
  Administered 2022-06-19: 10 mg via INTRAPLEURAL
  Filled 2022-06-19: qty 10

## 2022-06-19 MED ORDER — FENTANYL CITRATE PF 50 MCG/ML IJ SOSY
50.0000 ug | PREFILLED_SYRINGE | Freq: Once | INTRAMUSCULAR | Status: AC
  Administered 2022-06-19: 50 ug via INTRAVENOUS
  Filled 2022-06-19: qty 1

## 2022-06-19 MED ORDER — FENTANYL CITRATE PF 50 MCG/ML IJ SOSY
25.0000 ug | PREFILLED_SYRINGE | INTRAMUSCULAR | Status: DC | PRN
  Administered 2022-06-20 (×2): 25 ug via INTRAVENOUS
  Filled 2022-06-19 (×2): qty 1

## 2022-06-19 MED ORDER — STERILE WATER FOR INJECTION IJ SOLN
5.0000 mg | Freq: Once | RESPIRATORY_TRACT | Status: AC
  Administered 2022-06-19: 5 mg via INTRAPLEURAL
  Filled 2022-06-19: qty 5

## 2022-06-19 NOTE — Progress Notes (Signed)
Referring Physician(s): Gardenia Phlegm   Supervising Physician: Mir, Sharen Heck  Patient Status:  [facility] - In-pt  Chief Complaint:  Loculated left pleural effusion and shortness of breath  S/p left chest tube placement by Dr. Maryelizabeth Kaufmann on 06/18/22   Subjective:  Patient sitting in bed, NAD, RN at bedside.  Per RN, the stock lock was closed overnight. Patient just underwent lytics dwell. Patient reports her breathing is much better than yesterday, no complaints regarding the chest tube.   Allergies: Ambien [zolpidem] and Reglan [metoclopramide]  Medications: Prior to Admission medications   Medication Sig Start Date End Date Taking? Authorizing Provider  albuterol (VENTOLIN HFA) 108 (90 Base) MCG/ACT inhaler Inhale 2 puffs into the lungs every 6 (six) hours as needed for wheezing or shortness of breath. 11/21/20  Yes Gerlene Fee, NP  aspirin 325 MG tablet Take 650 mg by mouth daily. 07/09/18  Yes Rehman, Mechele Dawley, MD  benzonatate (TESSALON) 100 MG capsule Take 1 capsule (100 mg total) by mouth 3 (three) times daily as needed for cough. 06/03/22  Yes Shah, Pratik D, DO  cyanocobalamin (,VITAMIN B-12,) 1000 MCG/ML injection Inject 1,000 mcg into the muscle every 30 (thirty) days. 11/01/14  Yes [provider]  diclofenac Sodium (VOLTAREN) 1 % GEL Apply 4 g topically 4 (four) times daily as needed. Apply to bilateral knees 03/16/21  Yes Scot Jun, FNP  insulin NPH-regular Human (HUMULIN 70/30) (70-30) 100 UNIT/ML injection Inject 5-14 Units into the skin 2 (two) times daily with a meal. Patient taking differently: Inject 5-10 Units into the skin 2 (two) times daily with a meal. Take 10 units in the morning and 5 units at bedtime 02/03/22  Yes Nida, Marella Chimes, MD  labetalol (NORMODYNE) 200 MG tablet Take 200 mg by mouth 2 (two) times daily. 07/26/21  Yes [provider]  multivitamin (RENA-VIT) TABS tablet Take 1 tablet by mouth at bedtime. 08/21/14  Yes Kirsteins,  Luanna Salk, MD  omeprazole (PRILOSEC) 40 MG capsule TAKE ONE CAPSULE BY MOUTH TWICE DAILY BEFORE A meal Patient taking differently: Take 40 mg by mouth in the morning and at bedtime. 04/16/22  Yes Mansouraty, Telford Nab., MD  sevelamer carbonate (RENVELA) 800 MG tablet Take 1 tablet (800 mg total) by mouth 2 (two) times daily before lunch and supper. 11/21/20  Yes Gerlene Fee, NP  BD INSULIN SYRINGE U/F 31G X 5/16" 0.3 ML MISC Inject 1 each into the skin 2 (two) times daily. as directed 11/21/20   Gerlene Fee, NP     Vital Signs: BP (!) 130/48   Pulse 88   Temp 98.6 F (37 C) (Oral)   Resp 17   Ht '5\' 7"'$  (1.702 m)   Wt 151 lb 10.8 oz (68.8 kg)   SpO2 96%   BMI 23.76 kg/m   Physical Exam Vitals reviewed.  Constitutional:      General: She is not in acute distress.    Appearance: She is not ill-appearing.  Pulmonary:     Effort: Pulmonary effort is normal.  Chest:     Comments: Positive left chest tube to Pleur-evac . Site is unremarkable with no erythema, edema, tenderness, bleeding, drainage, sq emphysema. Suture and stat lock in place. Dressing is clean, dry, and intact. Pink colored colored fluid noted in the Pleur-evac. Possible small air leak, coming from insertion site.  Skin:    General: Skin is warm and dry.     Coloration: Skin is not cyanotic or pale.  Neurological:     Mental Status: She is alert.  Psychiatric:        Mood and Affect: Mood normal.        Behavior: Behavior normal.     Imaging: DG Chest Port 1 View  Result Date: 06/19/2022 CLINICAL DATA:  Chest tube placement EXAM: PORTABLE CHEST 1 VIEW COMPARISON:  Chest 06/12/2022 FINDINGS: Interval placement of pigtail drainage tube in the left lung base. Moderately large left pleural effusion unchanged. Left lower lobe consolidation unchanged. Mild progression of left upper lobe airspace no pneumothorax Right lung remains clear. Cardiac loop recorder noted. Cardiac enlargement without heart failure.  IMPRESSION: Interval placement of pigtail drainage tube in the left lung base. No change in left lower lobe airspace disease. Mild progression of left upper lobe airspace disease. Moderately large left pleural effusion unchanged. Electronically Signed   By: Franchot Gallo M.D.   On: 06/19/2022 07:43   CT [facility] PLEURAL DRAIN W/INDWELL CATH W/IMG GUIDE  Result Date: 06/18/2022 INDICATION: Loculated LEFT pleural effusion. EXAM: CT-GUIDED LEFT THORACOSTOMY DRAINAGE TUBE PLACEMENT COMPARISON:  CT CHEST, 06/13/2022.  IR ULTRASOUND, 06/12/2022. MEDICATIONS: The patient is currently admitted to the hospital and receiving intravenous antibiotics. The antibiotics were administered within an appropriate time frame prior to the initiation of the procedure. ANESTHESIA/SEDATION: Moderate (conscious) sedation was employed during this procedure. A total of Versed 0.5 mg and Fentanyl 37.5 mcg was administered intravenously. Moderate Sedation Time: 22 minutes. The patient's level of consciousness and vital signs were monitored continuously by radiology nursing throughout the procedure under my direct supervision. CONTRAST:  None COMPLICATIONS: None immediate. PROCEDURE: RADIATION DOSE REDUCTION: This exam was performed according to the departmental dose-optimization program which includes automated exposure control, adjustment of the mA and/or kV according to patient size and/or use of iterative reconstruction technique. Informed written consent was obtained from the patient and/or patient's representative after a discussion of the risks, benefits and alternatives to treatment. The patient was placed supine on the CT gantry and a pre procedural CT was performed re-demonstrating the known fluid collection within the LEFT chest. The procedure was planned. A timeout was performed prior to the initiation of the procedure. The LEFT chest was prepped and draped in the usual sterile fashion. The overlying soft tissues were anesthetized  with 1% lidocaine. Appropriate trajectory was planned with the use of a 22 gauge spinal needle. An 18 gauge trocar needle was advanced into the LEFT chest collection and a short Amplatz super stiff wire was coiled within the collection. Appropriate positioning was confirmed with a limited CT scan. The tract was serially dilated allowing placement of a 12 Fr drainage catheter. Appropriate positioning was confirmed with a limited postprocedural CT scan. 20 mL of thin pleural fluid was aspirated. The tube was connected to a pleura suction canister and sutured in place. A dressing was placed. The patient tolerated the procedure well without immediate post procedural complication. IMPRESSION: Successful CT guided placement of a LEFT 12 Fr non tunneled pleural drainage catheter for loculated pleural effusion. Samples were sent to the laboratory as requested by the ordering clinical team. Michaelle Birks, MD [facility] Radiology Electronically Signed   By: Michaelle Birks M.D.   On: 06/18/2022 15:19    Labs:  CBC: Recent Labs    06/15/22 0331 06/17/22 0541 06/18/22 0419 06/19/22 0348  WBC 15.7* 12.9* 10.9* 8.1  HGB 8.6* 8.6* 8.5* 9.2*  HCT 25.5* 25.8* 24.4* 26.4*  PLT 136* 178 156 165  COAGS: No results for input(s): "INR", "APTT" in the last 8760 hours.  BMP: Recent Labs    06/14/22 0414 06/15/22 1811 06/17/22 0645 06/19/22 0348  NA 136 134* 134* 135  K 4.1 4.5 4.6 3.9  CL 95* 95* 94* 94*  CO2 '26 25 27 27  '$ GLUCOSE 205* 116* 139* 259*  BUN 32* 26* 20 16  CALCIUM 7.7* 7.7* 7.9* 7.8*  CREATININE 6.96* 6.02* 5.25* 4.22*  GFRNONAA 5* 7* 8* 10*    LIVER FUNCTION TESTS: Recent Labs    05/01/22 2235 06/02/22 0740 06/09/22 2000 06/10/22 0353 06/11/22 0345 06/15/22 1811  BILITOT 0.7 0.8 0.5 0.7  --   --   AST '27 16 20 17  '$ --   --   ALT '15 11 13 13  '$ --   --   ALKPHOS 60 79 68 58  --   --   PROT 6.9 6.7 7.3 6.4*  --   --   ALBUMIN 3.3*  3.0* 3.1* 2.8* 2.5* 2.1*    Assessment and Plan:  82 y.o. female with loculated left pleural effusion s/p left chest tube placement by Dr. Maryelizabeth Kaufmann on 06/18/22.   CXR this morning: unchanged large left effusion.  Per RN, the stock lock was closed overnight. S/p pleural lytics today.  Possible small air leak from the insertion site, small air bubbles noted with cough, but  it could be from air in the tubing. No sq emphysema.  Asked RN to place a Vaseline gauze around the insertion site.   Further treatment plan per TRH/PCCM Appreciate and agree with the plan.  IR to follow.    Electronically Signed: Tera Mater, PA-C 06/19/2022, 1:42 PM   I spent a total of 25 Minutes at the the patient's bedside AND on the patient's hospital floor or unit, greater than 50% of which was counseling/coordinating care for left chest tube follow up.   This chart was dictated using voice recognition software.  Despite best efforts to proofread,  errors can occur which can change the documentation meaning.

## 2022-06-19 NOTE — Progress Notes (Signed)
[facility] NEPHROLOGY PROGRESS NOTE  Assessment/ Plan: Dialyzes at [facility] MWF  4 hours  EDW 75.5. HD Bath 2K, Dialyzer unknown, Heparin no. Access AVF right-  has post bleeding a lot.  Calcitriol 1 mcg q tx   # SOB/pleural effusion- markedly improved after HD.  off of supplemental oxygen.  Has L sided loculated pleural effusion, s/p chest tube 06/18/22 with IR.   #ESRD - On schedule MWF.   # HCAP- on vanc/ cefepime.  #HTN / volume: BP improved on labetalol.  Volume management with HD.  # Anemia- esa to start 12/31, transfuse prn.  #Delirium/agitation- thought to be secondary to cefepime which ahs been d/c'd. CTH without acute changes. Per primary.  Subjective:  HD yesterday 2L UF L thoracostomy tube placed yesterday with IR Fibrinolytic into pleural space today with PCCM  Objective Vital signs in last 24 hours: Vitals:   06/18/22 1746 06/18/22 2207 06/19/22 0520 06/19/22 0831  BP: (!) 106/36 (!) 111/42 (!) 124/59 (!) 130/48  Pulse: 79 89 86 88  Resp: '17 18 16 17  '$ Temp: 98.2 F (36.8 C) 98.2 F (36.8 C) 98.1 F (36.7 C) 98.6 F (37 C)  TempSrc: Oral Oral Oral Oral  SpO2: 100%  97% 96%  Weight:      Height:       Weight change:   Intake/Output Summary (Last 24 hours) at 06/19/2022 1306 Last data filed at 06/19/2022 0900 Gross per 24 hour  Intake 707.56 ml  Output 170 ml  Net 537.56 ml        Labs: RENAL PANEL Recent Labs    05/01/22 2235 05/09/22 0659 06/02/22 0740 06/03/22 0329 06/04/22 0344 06/09/22 2000 06/10/22 0353 06/11/22 0345 06/12/22 0327 06/13/22 0157 06/14/22 0414 06/15/22 0331 06/15/22 1811 06/17/22 0645 06/19/22 0348  NA 140   < > 137 136 136 139 139 137  --   --  136  --  134* 134* 135  K 3.7   < > 4.3 3.0* 3.2* 3.5 3.9 3.7  --   --  4.1  --  4.5 4.6 3.9  CL 100   < > 96* 95* 96* 99 101 97*  --   --  95*  --  95* 94* 94*  CO2 26   < > '28 28 28 28 25 28  '$ --   --  26  --  '25 27 27  '$ GLUCOSE 275*   < > 220*  132* 146* 187* 184* 210*  --   --  205*  --  116* 139* 259*  BUN 27*   < > 41* 21 34* 28* 31* 17  --   --  32*  --  26* 20 16  CREATININE 7.01*   < > 8.11* 4.68* 6.42* 7.43* 7.73* 4.54*  --   --  6.96*  --  6.02* 5.25* 4.22*  CALCIUM 8.1*   < > 8.0* 7.8* 7.9* 8.3* 7.9* 7.7*  --   --  7.7*  --  7.7* 7.9* 7.8*  MG  --   --  2.2  --  1.9  --  2.1 1.7 1.7 1.6* 1.8 1.8  --   --  1.7  PHOS  --   --  4.6  --   --   --   --  2.2*  --   --   --   --  3.1  --  3.1  ALBUMIN 3.3*  --  3.0*  --   --  3.1* 2.8*  2.5*  --   --   --   --  2.1*  --   --    < > = values in this interval not displayed.      Liver Function Tests: Recent Labs  Lab 06/15/22 1811  ALBUMIN 2.1*    No results for input(s): "LIPASE", "AMYLASE" in the last 168 hours. No results for input(s): "AMMONIA" in the last 168 hours. CBC: Recent Labs    06/14/22 0414 06/15/22 0331 06/15/22 0719 06/17/22 0541 06/18/22 0419 06/19/22 0348  HGB 7.6* 8.6*  --  8.6* 8.5* 9.2*  MCV 92.3 93.1  --  94.5 90.7 92.3  FERRITIN  --  1,418*  --   --   --   --   TIBC  --   --  136*  --   --   --   IRON  --   --  31  --   --   --      Cardiac Enzymes: No results for input(s): "CKTOTAL", "CKMB", "CKMBINDEX", "TROPONINI" in the last 168 hours. CBG: Recent Labs  Lab 06/18/22 0719 06/18/22 1703 06/18/22 2034 06/19/22 0711 06/19/22 1112  GLUCAP 164* 215* 270* 213* 188*     Iron Studies:  No results for input(s): "IRON", "TIBC", "TRANSFERRIN", "FERRITIN" in the last 72 hours.  Studies/Results: DG Chest Port 1 View  Result Date: 06/19/2022 CLINICAL DATA:  Chest tube placement EXAM: PORTABLE CHEST 1 VIEW COMPARISON:  Chest 06/12/2022 FINDINGS: Interval placement of pigtail drainage tube in the left lung base. Moderately large left pleural effusion unchanged. Left lower lobe consolidation unchanged. Mild progression of left upper lobe airspace no pneumothorax Right lung remains clear. Cardiac loop recorder noted. Cardiac enlargement without  heart failure. IMPRESSION: Interval placement of pigtail drainage tube in the left lung base. No change in left lower lobe airspace disease. Mild progression of left upper lobe airspace disease. Moderately large left pleural effusion unchanged. Electronically Signed   By: Franchot Gallo M.D.   On: 06/19/2022 07:43   CT [facility] PLEURAL DRAIN W/INDWELL CATH W/IMG GUIDE  Result Date: 06/18/2022 INDICATION: Loculated LEFT pleural effusion. EXAM: CT-GUIDED LEFT THORACOSTOMY DRAINAGE TUBE PLACEMENT COMPARISON:  CT CHEST, 06/13/2022.  IR ULTRASOUND, 06/12/2022. MEDICATIONS: The patient is currently admitted to the hospital and receiving intravenous antibiotics. The antibiotics were administered within an appropriate time frame prior to the initiation of the procedure. ANESTHESIA/SEDATION: Moderate (conscious) sedation was employed during this procedure. A total of Versed 0.5 mg and Fentanyl 37.5 mcg was administered intravenously. Moderate Sedation Time: 22 minutes. The patient's level of consciousness and vital signs were monitored continuously by radiology nursing throughout the procedure under my direct supervision. CONTRAST:  None COMPLICATIONS: None immediate. PROCEDURE: RADIATION DOSE REDUCTION: This exam was performed according to the departmental dose-optimization program which includes automated exposure control, adjustment of the mA and/or kV according to patient size and/or use of iterative reconstruction technique. Informed written consent was obtained from the patient and/or patient's representative after a discussion of the risks, benefits and alternatives to treatment. The patient was placed supine on the CT gantry and a pre procedural CT was performed re-demonstrating the known fluid collection within the LEFT chest. The procedure was planned. A timeout was performed prior to the initiation of the procedure. The LEFT chest was prepped and draped in the usual sterile fashion. The overlying soft tissues were  anesthetized with 1% lidocaine. Appropriate trajectory was planned with the use of a 22 gauge spinal needle. An 18 gauge trocar needle  was advanced into the LEFT chest collection and a short Amplatz super stiff wire was coiled within the collection. Appropriate positioning was confirmed with a limited CT scan. The tract was serially dilated allowing placement of a 12 Fr drainage catheter. Appropriate positioning was confirmed with a limited postprocedural CT scan. 20 mL of thin pleural fluid was aspirated. The tube was connected to a pleura suction canister and sutured in place. A dressing was placed. The patient tolerated the procedure well without immediate post procedural complication. IMPRESSION: Successful CT guided placement of a LEFT 12 Fr non tunneled pleural drainage catheter for loculated pleural effusion. Samples were sent to the laboratory as requested by the ordering clinical team. Michaelle Birks, MD [facility] Radiology Electronically Signed   By: Michaelle Birks M.D.   On: 06/18/2022 15:19    Medications: Infusions:    Scheduled Medications:  aspirin  650 mg Oral Daily   Chlorhexidine Gluconate Cloth  6 each Topical Q0600   Chlorhexidine Gluconate Cloth  6 each Topical Q0600   [START ON 06/23/2022] darbepoetin (ARANESP) injection - DIALYSIS  60 mcg Subcutaneous Q Mon-1800   dextromethorphan-guaiFENesin  1 tablet Oral BID   hydrocerin   Topical BID   insulin aspart  0-15 Units Subcutaneous TID WC   insulin aspart  0-5 Units Subcutaneous QHS   insulin glargine-yfgn  10 Units Subcutaneous Daily   labetalol  200 mg Oral BID   pantoprazole  40 mg Oral Daily   QUEtiapine  12.5 mg Oral QHS   sevelamer carbonate  800 mg Oral BID AC   sodium chloride flush  10 mL Intrapleural Q8H    have reviewed scheduled and prn medications.  Physical Exam: General:NAD, comfortable Heart:RRR, s1s2 nl Lungs:clear b/l, no crackle Abdomen:soft, Non-tender,  non-distended Extremities:No edema Dialysis Access: Right upper extremity AV fistula has thrill and bruit present  [name] 06/19/2022,1:06 PM  LOS: 10 days

## 2022-06-19 NOTE — Progress Notes (Signed)
Called to room by patient with complaints of pain at chest tube site. Patient given PRN morphine and PRN zofran. Patient repeatedly screaming, "somebody help me!" Attempted to console patient, patient states, "I will sue you for not helping me!"  RN reminded patient she just received pain medication and reassessed chest tube insertion site. Dressing clean, dry and intact, draining appropriately.

## 2022-06-19 NOTE — Procedures (Signed)
Pleural Fibrinolytic Administration Procedure Note  [name]  [id number]  01/17/1941  Date:06/19/22  Time:12:47 PM   Provider Performing:Margarett Viti C Tamala Julian   Procedure: Pleural Fibrinolysis Initial day [phone])  Indication(s) Fibrinolysis of complicated pleural effusion  Consent Risks of the procedure as well as the alternatives and risks of each were explained to the patient and/or caregiver.  Consent for the procedure was obtained.   Anesthesia None   Time Out Verified patient identification, verified procedure, site/side was marked, verified correct patient position, special equipment/implants available, medications/allergies/relevant history reviewed, required imaging and test results available.   Sterile Technique Hand hygiene, gloves   Procedure Description Existing pleural catheter was cleaned and accessed in sterile manner.  '10mg'$  of tPA in 30cc of saline and '5mg'$  of dornase in 30cc of sterile water were injected into pleural space using existing pleural catheter.  Catheter will be clamped for 1 hour and then placed back to suction.   Complications/Tolerance None; patient tolerated the procedure well.  EBL None   Specimen(s) None

## 2022-06-19 NOTE — Progress Notes (Addendum)
NAME:  [name], MRN:  [id number], DOB:  August 04, 1940, LOS: 76 ADMISSION DATE:  06/09/2022, CONSULTATION DATE:  06/17/17/2023 REFERRING MD:  Shawna Clamp, MD, CHIEF COMPLAINT:  left parapneumonic effusion   History of Present Illness:  82 year old female with a extensive past medical history is well-documented below is most significant for end-stage renal disease, history of stroke dyspnea left-sided pleural effusion that was drained on 06/10/2022 100 cc but subsequent follow-up radiographic data reveals enlargement of the effusion.  There was concern that this may be a clot.  She has been transferred to [facility] for further evaluation and treatment. Pulmonary critical care is recommended interventional radiology be consulted for any further thoracic interventions.  Underwent left thoracostomy with IR on 06/18/2022 and 12 fr pleural catheter placed.  Pertinent  Medical History  ESRD on HD, T2DM, CVA with residual R sided weakness, HTN, GERD, history of upper GI bleed with gastric banding, duodenal neuroendocrine tumor   Significant Hospital Events: Including procedures, antibiotic start and stop dates in addition to other pertinent events   06/18/2021 12 Fr pleural drain placed by IR  Interim History / Subjective:  Patient states she is feeling well this morning, cough and shortness of breath improved with chest tube placement. Not having significant pain from tube  Objective   Blood pressure (!) 130/48, pulse 88, temperature 98.6 F (37 C), temperature source Oral, resp. rate 17, height '5\' 7"'$  (1.702 m), weight 68.8 kg, SpO2 96 %.        Intake/Output Summary (Last 24 hours) at 06/19/2022 1012 Last data filed at 06/19/2022 0900 Gross per 24 hour  Intake 707.56 ml  Output 30 ml  Net 677.56 ml   Filed Weights   06/14/22 1058 06/15/22 0532 06/15/22 2238  Weight: 70.3 kg 71.7 kg 68.8 kg    Examination: General:  Sitting up in bed, in no acute distress HENT: normocephalic  atraumatic PULM: clear to auscultation, diminished breath sounds bilaterally, left chest tub in place, 130 mL serosanguinous output from chest tube. CV: RRR, no mgr GI: BS+, soft, nontender MSK: normal bulk and tone Neuro: Alert and oriented to person, place, situation but not time, conversational, somewhat confused  Resolved Hospital Problem list     Assessment & Plan:  Recurrent left exudative pleural effusion Suspect due to mutifocal pneumonia in mid December. Remains afebrile, leukocytosis resolved. On room air. Cultures negative, MRSA swab negative. IR placed chest tube on 06/18/2022. 129m serosanguinous draining in chest tube this morning. -plan for fibrinolytic via chest tube today -chest tube to continuous suction -continue zosyn, stop vancomycin cultures and MRSA swab negative  Delirium and other condition per primary Best Practice (right click and "Reselect all SmartList Selections" daily)   Diet/type: Regular consistency (see orders) DVT prophylaxis: systemic heparin GI prophylaxis: PPI Lines: N/A Foley:  N/A Code Status:  full code  Labs   CBC: Recent Labs  Lab 06/13/22 0157 06/14/22 0414 06/15/22 0331 06/17/22 0541 06/18/22 0419 06/19/22 0348  WBC 12.1* 14.5* 15.7* 12.9* 10.9* 8.1  NEUTROABS 9.0* 11.1* 12.5*  --   --   --   HGB 8.3* 7.6* 8.6* 8.6* 8.5* 9.2*  HCT 24.9* 21.5* 25.5* 25.8* 24.4* 26.4*  MCV 96.5 92.3 93.1 94.5 90.7 92.3  PLT 132* 122* 136* 178 156 1951   Basic Metabolic Panel: Recent Labs  Lab 06/13/22 0157 06/14/22 0414 06/15/22 0331 06/15/22 1811 06/17/22 0645 06/19/22 0348  NA  --  136  --  134* 134* 135  K  --  4.1  --  4.5 4.6 3.9  CL  --  95*  --  95* 94* 94*  CO2  --  26  --  '25 27 27  '$ GLUCOSE  --  205*  --  116* 139* 259*  BUN  --  32*  --  26* 20 16  CREATININE  --  6.96*  --  6.02* 5.25* 4.22*  CALCIUM  --  7.7*  --  7.7* 7.9* 7.8*  MG 1.6* 1.8 1.8  --   --  1.7  PHOS  --   --   --  3.1  --  3.1   GFR: Estimated  Creatinine Clearance: 10 mL/min (A) (by C-G formula based on SCr of 4.22 mg/dL (H)). Recent Labs  Lab 06/15/22 0331 06/17/22 0541 06/18/22 0419 06/19/22 0348  WBC 15.7* 12.9* 10.9* 8.1    Liver Function Tests: Recent Labs  Lab 06/15/22 1811  ALBUMIN 2.1*   No results for input(s): "LIPASE", "AMYLASE" in the last 168 hours. No results for input(s): "AMMONIA" in the last 168 hours.  ABG    Component Value Date/Time   TCO2 32 04/17/2022 0806     Coagulation Profile: No results for input(s): "INR", "PROTIME" in the last 168 hours.  Cardiac Enzymes: No results for input(s): "CKTOTAL", "CKMB", "CKMBINDEX", "TROPONINI" in the last 168 hours.  HbA1C: Hemoglobin A1C  Date/Time Value Ref Range Status  03/25/2021 12:00 AM 8.5  Final   HbA1c, POC (controlled diabetic range)  Date/Time Value Ref Range Status  02/03/2022 11:07 AM 8.1 (A) 0.0 - 7.0 % Final  07/25/2021 09:51 AM 8.7 (A) 0.0 - 7.0 % Final   Hgb A1c MFr Bld  Date/Time Value Ref Range Status  06/02/2022 07:40 AM 8.8 (H) 4.8 - 5.6 % Final    Comment:    (NOTE)         Prediabetes: 5.7 - 6.4         Diabetes: >6.4         Glycemic control for adults with diabetes: <7.0     CBG: Recent Labs  Lab 06/17/22 2118 06/18/22 0719 06/18/22 1703 06/18/22 2034 06/19/22 0711  GLUCAP 174* 164* 215* 270* 213*    Review of Systems:     Past Medical History:  She,  has a past medical history of Anemia, Cataract, Chronic kidney disease, CVA (cerebral infarction), Diabetes mellitus with ESRD (end-stage renal disease) ([place]), Dialysis patient ([place]), Fistula, GERD (gastroesophageal reflux disease), Hypertension, Renal disorder, Shortness of breath, and Stroke ([place]).

## 2022-06-19 NOTE — Progress Notes (Signed)
PT Cancellation Note  Patient Details Name: [name] MRN: [id number] DOB: [birth year]   Cancelled Treatment:    Reason Eval/Treat Not Completed: Patient at procedure or test/unavailable. PT to continue with attempts as appropriate.   Minna Merritts, PT, MPT  Percell Locus 06/19/2022, 2:47 PM

## 2022-06-19 NOTE — Progress Notes (Signed)
PROGRESS NOTE   [name]  LZJ:[id number] DOB: 11/26/1940 DOA: 06/09/2022  PCP: [name], MD  Brief Narrative:  This 82 yrs old female with history of ESRD on HD, diabetes mellitus type 2, GERD, hypertension, stroke. Patient presented secondary to shortness of breath and found to have recurrent pneumonia with associated left sided pleural effusion. Empiric antibiotics initiated and patient transferred to [facility] for consideration of chest tube placement. During admission, patient developed hospital delirium.  Patient continued on hemodialysis.  IR is consulted . She underwent left thoracostomy drainage catheter placement on 06/18/22.  Assessment & Plan:   Principal Problem:   Pleural effusion Active Problems:   Essential hypertension   Thrombocytopenia (HCC)   ESRD (end stage renal disease) (HCC)   DMII (diabetes mellitus, type 2) (HCC)   GERD (gastroesophageal reflux disease)   CAP (community acquired pneumonia)   Delirium  Exudative Pleural effusion: There is a concern for parapneumonic effusion. IR consulted and performed thoracentesis, yielding 1.2 L of serosanguinous fluid, followed by repeat thoracentesis with minimal amount of serosanguinous fluid and likely clot. Patient transferred to [facility] for pulmonology consult and consideration of chest tube. Concern for hemothorax. CT surgery has recommended CT guided chest tube. She underwent  Left thoracostomy drainage catheter by IR on 06/18/2022.  Patient remains on room air.  Patient underwent tube thrombolytics to clear the pleural space by pulmonology on 06/19/21.   Delirium: In the setting of history of multiple vascular injuries and current hospitalization. Likely hospital related delirium. Patient is also on Cefepime which could also be the culprit. Delirium improved. CT head unremarkable for acute process. -Delirium precautions -Continue Seroquel 12.5 mg qHS to help with sleep. -She appears back  to baseline mental status.   Community-acquired pneumonia: Patient was recently treated with Unasyn and azithromycin, prior to admission. On admission, patient was broadened to Vancomycin and Cefepime. Procalcitonin mildly elevated at 0.96. Afebrile with leukocytosis. Complicated by left pleural effusion. MRSA PCR negative. Antibiotics discontinued after 11 days.   GERD: Continue Protonix.   Diabetes mellitus type 2: Continue SSI   ESRD (end stage renal disease) [facility]) Nephrology consulted for hemodialysis while inpatient. Continue hemodialysis as per schedule.   Thrombocytopenia (HCC) Mild and likely reactive, secondary to infection. Platelet count >Stable.   Essential hypertension Continue labetalol.   Lethargy-resolved as of 06/17/2022. Likely secondary to Ativan given night prior to late session of HD. -Avoid benzodiazepines   Chest pain-resolved as of 06/13/2022 Likely secondary to pulmonary edema. Resolved.   Severe uncontrolled hypertension-resolved as of 06/13/2022 Patient with blood pressure up to 214/94, treated with Hydralazine IV and hemodialysis. Resolved.    DVT prophylaxis: Heparin sq Code Status: Full code. Family Communication:No family at bed side. Disposition Plan:    Status is: Inpatient Remains inpatient appropriate because: Admitted for large pleural effusion in the setting of ESRD on hemodialysis.  IR was consulted for possible chest tube insertion, Patient underwent tube thrombolytics to clear the pleural space by pulmonology on 06/19/21.    Consultants:  Nephrology Pulmonology Cardiothoracic surgery  Procedures: Left thoracocentesis 12/28 Antimicrobials:  Vancomycin Cefepime Zosyn IV  Subjective: Patient was seen and examined at bedside.  Overnight events noted. Patient underwent tube thrombolytics to clear the pleural space by pulmonology tolerated well.  Objective: Vitals:   06/18/22 1746 06/18/22 2207 06/19/22 0520 06/19/22 0831  BP:  (!) 106/36 (!) 111/42 (!) 124/59 (!) 130/48  Pulse: 79 89 86 88  Resp: '17 18 16 17  '$ Temp:  98.2 F (36.8 C) 98.2 F (36.8 C) 98.1 F (36.7 C) 98.6 F (37 C)  TempSrc: Oral Oral Oral Oral  SpO2: 100%  97% 96%  Weight:      Height:        Intake/Output Summary (Last 24 hours) at 06/19/2022 1334 Last data filed at 06/19/2022 0900 Gross per 24 hour  Intake 707.56 ml  Output 170 ml  Net 537.56 ml   Filed Weights   06/14/22 1058 06/15/22 0532 06/15/22 2238  Weight: 70.3 kg 71.7 kg 68.8 kg    Examination:  General exam: Appears comfortable, not in any acute distress.  Deconditioned Respiratory system: Decreased breath sounds, respiratory effort normal, RR 13. Cardiovascular system: S1 & S2 heard, regular rate and rhythm, no murmur. Gastrointestinal system: Abdomen is soft, non tender, non distended, BS+ Central nervous system: Alert and oriented x 3. No focal neurological deficits. Extremities: No edema, no cyanosis, no clubbing Skin: No rashes, lesions or ulcers Psychiatry: Judgement and insight appear normal. Mood & affect appropriate.     Data Reviewed: I have personally reviewed following labs and imaging studies  CBC: Recent Labs  Lab 06/13/22 0157 06/14/22 0414 06/15/22 0331 06/17/22 0541 06/18/22 0419 06/19/22 0348  WBC 12.1* 14.5* 15.7* 12.9* 10.9* 8.1  NEUTROABS 9.0* 11.1* 12.5*  --   --   --   HGB 8.3* 7.6* 8.6* 8.6* 8.5* 9.2*  HCT 24.9* 21.5* 25.5* 25.8* 24.4* 26.4*  MCV 96.5 92.3 93.1 94.5 90.7 92.3  PLT 132* 122* 136* 178 156 264   Basic Metabolic Panel: Recent Labs  Lab 06/13/22 0157 06/14/22 0414 06/15/22 0331 06/15/22 1811 06/17/22 0645 06/19/22 0348  NA  --  136  --  134* 134* 135  K  --  4.1  --  4.5 4.6 3.9  CL  --  95*  --  95* 94* 94*  CO2  --  26  --  '25 27 27  '$ GLUCOSE  --  205*  --  116* 139* 259*  BUN  --  32*  --  26* 20 16  CREATININE  --  6.96*  --  6.02* 5.25* 4.22*  CALCIUM  --  7.7*  --  7.7* 7.9* 7.8*  MG 1.6* 1.8 1.8  --    --  1.7  PHOS  --   --   --  3.1  --  3.1   GFR: Estimated Creatinine Clearance: 10 mL/min (A) (by C-G formula based on SCr of 4.22 mg/dL (H)). Liver Function Tests: Recent Labs  Lab 06/15/22 1811  ALBUMIN 2.1*   No results for input(s): "LIPASE", "AMYLASE" in the last 168 hours. No results for input(s): "AMMONIA" in the last 168 hours. Coagulation Profile: No results for input(s): "INR", "PROTIME" in the last 168 hours. Cardiac Enzymes: No results for input(s): "CKTOTAL", "CKMB", "CKMBINDEX", "TROPONINI" in the last 168 hours. BNP (last 3 results) No results for input(s): "PROBNP" in the last 8760 hours. HbA1C: No results for input(s): "HGBA1C" in the last 72 hours. CBG: Recent Labs  Lab 06/18/22 0719 06/18/22 1703 06/18/22 2034 06/19/22 0711 06/19/22 1112  GLUCAP 164* 215* 270* 213* 188*   Lipid Profile: No results for input(s): "CHOL", "HDL", "LDLCALC", "TRIG", "CHOLHDL", "LDLDIRECT" in the last 72 hours. Thyroid Function Tests: No results for input(s): "TSH", "T4TOTAL", "FREET4", "T3FREE", "THYROIDAB" in the last 72 hours. Anemia Panel: No results for input(s): "VITAMINB12", "FOLATE", "FERRITIN", "TIBC", "IRON", "RETICCTPCT" in the last 72 hours. Sepsis Labs: No results for input(s): "PROCALCITON", "LATICACIDVEN" in  the last 168 hours.  Recent Results (from the past 240 hour(s))  Resp panel by RT-PCR (RSV, Flu A&B, Covid) Anterior Nasal Swab     Status: None   Collection Time: 06/09/22 10:17 PM   Specimen: Anterior Nasal Swab  Result Value Ref Range Status   SARS Coronavirus 2 by RT PCR NEGATIVE NEGATIVE Final    Comment: (NOTE) SARS-CoV-2 target nucleic acids are NOT DETECTED.  The SARS-CoV-2 RNA is generally detectable in upper respiratory specimens during the acute phase of infection. The lowest concentration of SARS-CoV-2 viral copies this assay can detect is 138 copies/mL. A negative result does not preclude SARS-Cov-2 infection and should not be used as  the sole basis for treatment or other patient management decisions. A negative result may occur with  improper specimen collection/handling, submission of specimen other than nasopharyngeal swab, presence of viral mutation(s) within the areas targeted by this assay, and inadequate number of viral copies(<138 copies/mL). A negative result must be combined with clinical observations, patient history, and epidemiological information. The expected result is Negative.  Fact Sheet for Patients:  EntrepreneurPulse.com.au  Fact Sheet for Healthcare Providers:  IncredibleEmployment.be  This test is no t yet approved or cleared by the [place] FDA and  has been authorized for detection and/or diagnosis of SARS-CoV-2 by FDA under an Emergency Use Authorization (EUA). This EUA will remain  in effect (meaning this test can be used) for the duration of the COVID-19 declaration under Section 564(b)(1) of the Act, 21 U.S.C.section 360bbb-3(b)(1), unless the authorization is terminated  or revoked sooner.       Influenza A by PCR NEGATIVE NEGATIVE Final   Influenza B by PCR NEGATIVE NEGATIVE Final    Comment: (NOTE) The Xpert Xpress SARS-CoV-2/FLU/RSV plus assay is intended as an aid in the diagnosis of influenza from Nasopharyngeal swab specimens and should not be used as a sole basis for treatment. Nasal washings and aspirates are unacceptable for Xpert Xpress SARS-CoV-2/FLU/RSV testing.  Fact Sheet for Patients: EntrepreneurPulse.com.au  Fact Sheet for Healthcare Providers: IncredibleEmployment.be  This test is not yet approved or cleared by the [place] FDA and has been authorized for detection and/or diagnosis of SARS-CoV-2 by FDA under an Emergency Use Authorization (EUA). This EUA will remain in effect (meaning this test can be used) for the duration of the COVID-19 declaration under Section 564(b)(1) of  the Act, 21 U.S.C. section 360bbb-3(b)(1), unless the authorization is terminated or revoked.     Resp Syncytial Virus by PCR NEGATIVE NEGATIVE Final    Comment: (NOTE) Fact Sheet for Patients: EntrepreneurPulse.com.au  Fact Sheet for Healthcare Providers: IncredibleEmployment.be  This test is not yet approved or cleared by the [place] FDA and has been authorized for detection and/or diagnosis of SARS-CoV-2 by FDA under an Emergency Use Authorization (EUA). This EUA will remain in effect (meaning this test can be used) for the duration of the COVID-19 declaration under Section 564(b)(1) of the Act, 21 U.S.C. section 360bbb-3(b)(1), unless the authorization is terminated or revoked.  Performed at [facility], [address] [place], [place] [address]   Culture, blood (routine x 2) Call MD if unable to obtain prior to antibiotics being given     Status: None   Collection Time: 06/09/22 10:48 PM   Specimen: BLOOD LEFT ARM  Result Value Ref Range Status   Specimen Description BLOOD LEFT ARM BOTTLES DRAWN AEROBIC AND ANAEROBIC  Final   Special Requests Blood Culture adequate volume  Final   Culture  Final    NO GROWTH 5 DAYS Performed at [facility], [address] [place], [place] [address]    Report Status 06/14/2022 FINAL  Final  Culture, blood (routine x 2) Call MD if unable to obtain prior to antibiotics being given     Status: None   Collection Time: 06/09/22 10:49 PM   Specimen: Site Not Specified; Blood  Result Value Ref Range Status   Specimen Description   Final    SITE NOT SPECIFIED BOTTLES DRAWN AEROBIC AND ANAEROBIC   Special Requests Blood Culture adequate volume  Final   Culture   Final    NO GROWTH 5 DAYS Performed at [facility], [address], [place], [place] [address]    Report Status 06/14/2022 FINAL  Final  Culture, body fluid w Gram Stain-bottle     Status: None   Collection Time: 06/10/22 11:35 AM    Specimen: Pleura  Result Value Ref Range Status   Specimen Description PLEURAL  Final   Special Requests BOTTLES DRAWN AEROBIC AND ANAEROBIC 10CC  Final   Culture   Final    NO GROWTH 5 DAYS Performed at [facility], [address], [place], [place] [address]    Report Status 06/15/2022 FINAL  Final  Gram stain     Status: None   Collection Time: 06/10/22 11:35 AM   Specimen: Pleura  Result Value Ref Range Status   Specimen Description PLEURAL  Final   Special Requests BOTTLES DRAWN AEROBIC AND ANAEROBIC 10 CC  Final   Gram Stain   Final    NO ORGANISMS SEEN WBC PRESENT,BOTH PMN AND MONONUCLEAR CYTOSPIN SMEAR Performed at [facility], [address] [place], [place] [address]    Report Status 06/10/2022 FINAL  Final  MRSA Next Gen by PCR, Nasal     Status: None   Collection Time: 06/10/22  5:00 PM   Specimen: Nasal Mucosa; Nasal Swab  Result Value Ref Range Status   MRSA by PCR Next Gen NOT DETECTED NOT DETECTED Final    Comment: (NOTE) The GeneXpert MRSA Assay (FDA approved for NASAL specimens only), is one component of a comprehensive MRSA colonization surveillance program. It is not intended to diagnose MRSA infection nor to guide or monitor treatment for MRSA infections. Test performance is not FDA approved in patients less than 12 years old. Performed at [facility], [address] [place], [place] [address]   Aerobic/Anaerobic Culture w Gram Stain (surgical/deep wound)     Status: None (Preliminary result)   Collection Time: 06/18/22  1:51 PM   Specimen: Pleural Fluid  Result Value Ref Range Status   Specimen Description FLUID PLEURAL  Final   Special Requests NONE  Final   Gram Stain NO WBC SEEN NO ORGANISMS SEEN   Final   Culture   Final    NO GROWTH < 24 HOURS Performed at [facility], [place] [address] [place], [place] [address]    Report Status PENDING  Incomplete   Radiology Studies: DG Chest Port 1 View  Result Date: 06/19/2022 CLINICAL DATA:   Chest tube placement EXAM: PORTABLE CHEST 1 VIEW COMPARISON:  Chest 06/12/2022 FINDINGS: Interval placement of pigtail drainage tube in the left lung base. Moderately large left pleural effusion unchanged. Left lower lobe consolidation unchanged. Mild progression of left upper lobe airspace no pneumothorax Right lung remains clear. Cardiac loop recorder noted. Cardiac enlargement without heart failure. IMPRESSION: Interval placement of pigtail drainage tube in the left lung base. No change  in left lower lobe airspace disease. Mild progression of left upper lobe airspace disease. Moderately large left pleural effusion unchanged. Electronically Signed   By: Franchot Gallo M.D.   On: 06/19/2022 07:43   CT [facility] PLEURAL DRAIN W/INDWELL CATH W/IMG GUIDE  Result Date: 06/18/2022 INDICATION: Loculated LEFT pleural effusion. EXAM: CT-GUIDED LEFT THORACOSTOMY DRAINAGE TUBE PLACEMENT COMPARISON:  CT CHEST, 06/13/2022.  IR ULTRASOUND, 06/12/2022. MEDICATIONS: The patient is currently admitted to the hospital and receiving intravenous antibiotics. The antibiotics were administered within an appropriate time frame prior to the initiation of the procedure. ANESTHESIA/SEDATION: Moderate (conscious) sedation was employed during this procedure. A total of Versed 0.5 mg and Fentanyl 37.5 mcg was administered intravenously. Moderate Sedation Time: 22 minutes. The patient's level of consciousness and vital signs were monitored continuously by radiology nursing throughout the procedure under my direct supervision. CONTRAST:  None COMPLICATIONS: None immediate. PROCEDURE: RADIATION DOSE REDUCTION: This exam was performed according to the departmental dose-optimization program which includes automated exposure control, adjustment of the mA and/or kV according to patient size and/or use of iterative reconstruction technique. Informed written consent was obtained from the patient and/or patient's representative after a discussion of the  risks, benefits and alternatives to treatment. The patient was placed supine on the CT gantry and a pre procedural CT was performed re-demonstrating the known fluid collection within the LEFT chest. The procedure was planned. A timeout was performed prior to the initiation of the procedure. The LEFT chest was prepped and draped in the usual sterile fashion. The overlying soft tissues were anesthetized with 1% lidocaine. Appropriate trajectory was planned with the use of a 22 gauge spinal needle. An 18 gauge trocar needle was advanced into the LEFT chest collection and a short Amplatz super stiff wire was coiled within the collection. Appropriate positioning was confirmed with a limited CT scan. The tract was serially dilated allowing placement of a 12 Fr drainage catheter. Appropriate positioning was confirmed with a limited postprocedural CT scan. 20 mL of thin pleural fluid was aspirated. The tube was connected to a pleura suction canister and sutured in place. A dressing was placed. The patient tolerated the procedure well without immediate post procedural complication. IMPRESSION: Successful CT guided placement of a LEFT 12 Fr non tunneled pleural drainage catheter for loculated pleural effusion. Samples were sent to the laboratory as requested by the ordering clinical team. Michaelle Birks, MD [facility] Radiology Electronically Signed   By: Michaelle Birks M.D.   On: 06/18/2022 15:19    Scheduled Meds:  aspirin  650 mg Oral Daily   Chlorhexidine Gluconate Cloth  6 each Topical Q0600   Chlorhexidine Gluconate Cloth  6 each Topical Q0600   [START ON 06/23/2022] darbepoetin (ARANESP) injection - DIALYSIS  60 mcg Subcutaneous Q Mon-1800   dextromethorphan-guaiFENesin  1 tablet Oral BID   hydrocerin   Topical BID   insulin aspart  0-15 Units Subcutaneous TID WC   insulin aspart  0-5 Units Subcutaneous QHS   insulin glargine-yfgn  10 Units Subcutaneous Daily    labetalol  200 mg Oral BID   pantoprazole  40 mg Oral Daily   QUEtiapine  12.5 mg Oral QHS   sevelamer carbonate  800 mg Oral BID AC   sodium chloride flush  10 mL Intrapleural Q8H   Continuous Infusions:    LOS: 10 days    Time spent: 35 mins    Johnhenry Tippin, MD [facility]   If 7PM-7AM, please contact night-coverage

## 2022-06-19 NOTE — Progress Notes (Signed)
LEReceived patient in bed to unit.  Alert and oriented.  Informed consent signed and in chart.   Treatment initiated: 0845 Treatment completed: 1238  Patient tolerated well.  Transported back to the room  Alert, without acute distress.  Hand-off given to patient's nurse.   Access used: AVF Access issues: none   Total UF removed: 2L  Medication(s) given: none Post HD VS: 142/58,82,98%,97.918  Post HD weight: 64.4   [facility]

## 2022-06-20 ENCOUNTER — Inpatient Hospital Stay (HOSPITAL_COMMUNITY): Payer: 59

## 2022-06-20 DIAGNOSIS — J90 Pleural effusion, not elsewhere classified: Secondary | ICD-10-CM | POA: Diagnosis not present

## 2022-06-20 DIAGNOSIS — R410 Disorientation, unspecified: Secondary | ICD-10-CM | POA: Diagnosis not present

## 2022-06-20 DIAGNOSIS — J189 Pneumonia, unspecified organism: Secondary | ICD-10-CM | POA: Diagnosis not present

## 2022-06-20 DIAGNOSIS — Z4682 Encounter for fitting and adjustment of non-vascular catheter: Secondary | ICD-10-CM

## 2022-06-20 LAB — RENAL FUNCTION PANEL
Albumin: 2 g/dL — ABNORMAL LOW (ref 3.5–5.0)
Anion gap: 14 (ref 5–15)
BUN: 23 mg/dL (ref 8–23)
CO2: 26 mmol/L (ref 22–32)
Calcium: 7.9 mg/dL — ABNORMAL LOW (ref 8.9–10.3)
Chloride: 94 mmol/L — ABNORMAL LOW (ref 98–111)
Creatinine, Ser: 5.88 mg/dL — ABNORMAL HIGH (ref 0.44–1.00)
GFR, Estimated: 7 mL/min — ABNORMAL LOW (ref >60)
Glucose, Bld: 170 mg/dL — ABNORMAL HIGH (ref 70–99)
Phosphorus: 4 mg/dL (ref 2.5–4.6)
Potassium: 4.6 mmol/L (ref 3.5–5.1)
Sodium: 134 mmol/L — ABNORMAL LOW (ref 135–145)

## 2022-06-20 LAB — CBC
HCT: 25.7 % — ABNORMAL LOW (ref 36.0–46.0)
Hemoglobin: 8.5 g/dL — ABNORMAL LOW (ref 12.0–15.0)
MCH: 31.1 pg (ref 26.0–34.0)
MCHC: 33.1 g/dL (ref 30.0–36.0)
MCV: 94.1 fL (ref 80.0–100.0)
Platelets: 170 10*3/uL (ref 150–400)
RBC: 2.73 MIL/uL — ABNORMAL LOW (ref 3.87–5.11)
RDW: 16.9 % — ABNORMAL HIGH (ref 11.5–15.5)
WBC: 11.6 10*3/uL — ABNORMAL HIGH (ref 4.0–10.5)
nRBC: 0 % (ref 0.0–0.2)

## 2022-06-20 LAB — GLUCOSE, CAPILLARY
Glucose-Capillary: 149 mg/dL — ABNORMAL HIGH (ref 70–99)
Glucose-Capillary: 149 mg/dL — ABNORMAL HIGH (ref 70–99)
Glucose-Capillary: 119 mg/dL — ABNORMAL HIGH (ref 70–99)
Glucose-Capillary: 289 mg/dL — ABNORMAL HIGH (ref 70–99)
Glucose-Capillary: 113 mg/dL — ABNORMAL HIGH (ref 70–99)

## 2022-06-20 MED ORDER — STERILE WATER FOR INJECTION IJ SOLN
5.0000 mg | Freq: Once | RESPIRATORY_TRACT | Status: AC
  Administered 2022-06-20: 5 mg via INTRAPLEURAL
  Filled 2022-06-20: qty 5

## 2022-06-20 MED ORDER — SODIUM CHLORIDE (PF) 0.9 % IJ SOLN
10.0000 mg | Freq: Once | INTRAMUSCULAR | Status: AC
  Administered 2022-06-20: 10 mg via INTRAPLEURAL
  Filled 2022-06-20: qty 10

## 2022-06-20 MED ORDER — HEPARIN SODIUM (PORCINE) 5000 UNIT/ML IJ SOLN
5000.0000 [IU] | Freq: Two times a day (BID) | INTRAMUSCULAR | Status: DC
  Administered 2022-06-20 – 2022-06-21 (×2): 5000 [IU] via SUBCUTANEOUS
  Filled 2022-06-20 (×2): qty 1

## 2022-06-20 NOTE — Progress Notes (Signed)
PT Cancellation Note  Patient Details Name: [name] MRN: [id number] DOB: 09/05/1940   Cancelled Treatment:    Reason Eval/Treat Not Completed: Patient at procedure or test/unavailable Pt off floor at HD. Will follow.   Marguarite Arbour A Denee Boeder 06/20/2022, 9:00 AM Marisa Severin, PT, DPT Acute Rehabilitation Services Secure chat preferred Office [phone]

## 2022-06-20 NOTE — Plan of Care (Signed)
  Problem: Activity: Goal: Ability to tolerate increased activity will improve Outcome: Progressing   Problem: Clinical Measurements: Goal: Ability to maintain a body temperature in the normal range will improve Outcome: Progressing   Problem: Respiratory: Goal: Ability to maintain adequate ventilation will improve Outcome: Progressing Goal: Ability to maintain a clear airway will improve Outcome: Progressing   Problem: Education: Goal: Knowledge of General Education information will improve Description: Including pain rating scale, medication(s)/side effects and non-pharmacologic comfort measures Outcome: Progressing   Problem: Health Behavior/Discharge Planning: Goal: Ability to manage health-related needs will improve Outcome: Progressing   Problem: Clinical Measurements: Goal: Ability to maintain clinical measurements within normal limits will improve Outcome: Progressing Goal: Will remain free from infection Outcome: Progressing Goal: Diagnostic test results will improve Outcome: Progressing Goal: Respiratory complications will improve Outcome: Progressing Goal: Cardiovascular complication will be avoided Outcome: Progressing   Problem: Activity: Goal: Risk for activity intolerance will decrease Outcome: Progressing   Problem: Nutrition: Goal: Adequate nutrition will be maintained Outcome: Progressing   Problem: Coping: Goal: Level of anxiety will decrease Outcome: Progressing   Problem: Elimination: Goal: Will not experience complications related to bowel motility Outcome: Progressing Goal: Will not experience complications related to urinary retention Outcome: Progressing   Problem: Pain Managment: Goal: General experience of comfort will improve Outcome: Progressing   Problem: Safety: Goal: Ability to remain free from injury will improve Outcome: Progressing   Problem: Skin Integrity: Goal: Risk for impaired skin integrity will decrease Outcome:  Progressing   Problem: Education: Goal: Ability to describe self-care measures that may prevent or decrease complications (Diabetes Survival Skills Education) will improve Outcome: Progressing Goal: Individualized Educational Video(s) Outcome: Progressing   Problem: Coping: Goal: Ability to adjust to condition or change in health will improve Outcome: Progressing   Problem: Fluid Volume: Goal: Ability to maintain a balanced intake and output will improve Outcome: Progressing   Problem: Health Behavior/Discharge Planning: Goal: Ability to identify and utilize available resources and services will improve Outcome: Progressing Goal: Ability to manage health-related needs will improve Outcome: Progressing   Problem: Metabolic: Goal: Ability to maintain appropriate glucose levels will improve Outcome: Progressing   Problem: Nutritional: Goal: Maintenance of adequate nutrition will improve Outcome: Progressing Goal: Progress toward achieving an optimal weight will improve Outcome: Progressing   Problem: Skin Integrity: Goal: Risk for impaired skin integrity will decrease Outcome: Progressing   Problem: Tissue Perfusion: Goal: Adequacy of tissue perfusion will improve Outcome: Progressing   

## 2022-06-20 NOTE — Progress Notes (Signed)
NAME:  [name], MRN:  [id number], DOB:  06/24/1940, LOS: 46 ADMISSION DATE:  06/09/2022, CONSULTATION DATE:  06/17/17/2023 REFERRING MD:  Shawna Clamp, MD, CHIEF COMPLAINT:  left parapneumonic effusion   History of Present Illness:  82 year old female with a extensive past medical history is well-documented below is most significant for end-stage renal disease, history of stroke dyspnea left-sided pleural effusion that was drained on 06/10/2022 100 cc but subsequent follow-up radiographic data reveals enlargement of the effusion.  There was concern that this may be a clot.  She has been transferred to [facility] for further evaluation and treatment. Pulmonary critical care is recommended interventional radiology be consulted for any further thoracic interventions.  Underwent left thoracostomy with IR on 06/18/2022 and 12 fr pleural catheter placed.  Pertinent  Medical History  ESRD on HD, T2DM, CVA with residual R sided weakness, HTN, GERD, history of upper GI bleed with gastric banding, duodenal neuroendocrine tumor   Significant Hospital Events: Including procedures, antibiotic start and stop dates in addition to other pertinent events   06/18/2021 12 Fr pleural drain placed by IR  Interim History / Subjective:  Today she complains of itching.   Objective   Blood pressure (!) 110/43, pulse 86, temperature 98.4 F (36.9 C), temperature source Oral, resp. rate 18, height '5\' 7"'$  (1.702 m), weight 67 kg, SpO2 100 %.        Intake/Output Summary (Last 24 hours) at 06/20/2022 1609 Last data filed at 06/20/2022 1500 Gross per 24 hour  Intake 590 ml  Output 1370 ml  Net -780 ml    Filed Weights   06/15/22 2238 06/20/22 0816 06/20/22 1226  Weight: 68.8 kg 67.8 kg 67 kg    Examination: General: chronically ill appearing woman lying in bed in NAD HENT: [place]/AT, eyes anicteric PULM: breathing comfortably on RA, chest tube with bloody drainage on R. No conversational dyspnea.  Rhales on left. CV: S1S2, RRR GI: obese, soft, NT Extremities: fistula RUE, no edema Neuro: awake, alert, moving extremities  Chest tube output: 1050cc  CXR personally reviewed> improving effusion, chest tube in appropriate position  pleural fluid cultures: [facility] Problem list     Assessment & Plan:  Recurrent left exudative pleural effusion Suspect due to mutifocal pneumonia in mid December. Remains afebrile, leukocytosis resolved. On room air. Cultures negative, MRSA swab negative. IR placed chest tube on 06/18/2022.  -additional TPA/dornase today -con't chest tube to continuous suction -con't antibiotics -AM CXR  Itching -Atarax PRN  Rest of care per primary.   Best Practice (right click and "Reselect all SmartList Selections" daily)   Diet/type: Regular consistency (see orders) DVT prophylaxis: SCD GI prophylaxis: PPI Lines: N/A Foley:  N/A Code Status:  full code  Labs   CBC: Recent Labs  Lab 06/14/22 0414 06/15/22 0331 06/17/22 0541 06/18/22 0419 06/19/22 0348 06/19/22 1530 06/20/22 0846  WBC 14.5* 15.7* 12.9* 10.9* 8.1 11.5* 11.6*  NEUTROABS 11.1* 12.5*  --   --   --   --   --   HGB 7.6* 8.6* 8.6* 8.5* 9.2* 8.6* 8.5*  HCT 21.5* 25.5* 25.8* 24.4* 26.4* 25.3* 25.7*  MCV 92.3 93.1 94.5 90.7 92.3 93.4 94.1  PLT 122* 136* 178 156 165 163 170     Basic Metabolic Panel: Recent Labs  Lab 06/14/22 0414 06/15/22 0331 06/15/22 1811 06/17/22 0645 06/19/22 0348  NA 136  --  134* 134* 135  K 4.1  --  4.5 4.6 3.9  CL  95*  --  95* 94* 94*  CO2 26  --  '25 27 27  '$ GLUCOSE 205*  --  116* 139* 259*  BUN 32*  --  26* 20 16  CREATININE 6.96*  --  6.02* 5.25* 4.22*  CALCIUM 7.7*  --  7.7* 7.9* 7.8*  MG 1.8 1.8  --   --  1.7  PHOS  --   --  3.1  --  3.1    GFR: Estimated Creatinine Clearance: 10 mL/min (A) (by C-G formula based on SCr of 4.22 mg/dL (H)). Recent Labs  Lab 06/18/22 0419 06/19/22 0348 06/19/22 1530 06/20/22 0846  WBC  10.9* 8.1 11.5* 11.6*   Julian Hy, DO 06/20/22 6:40 PM [facility]  For contact information, see Amion. If no response to pager, please call PCCM consult pager. After hours, 7PM- 7AM, please call Elink.

## 2022-06-20 NOTE — Progress Notes (Signed)
PROGRESS NOTE   [name]  OJJ:[id number] DOB: January 12, 1941 DOA: 06/09/2022  PCP: [name], MD  Brief Narrative:  This 82 yrs old female with history of ESRD on HD, diabetes mellitus type 2, GERD, hypertension, stroke. Patient presented secondary to shortness of breath and found to have recurrent pneumonia with associated left sided pleural effusion. Empiric antibiotics initiated and patient transferred to [facility] for consideration of chest tube placement. During admission, patient developed hospital delirium.  Patient continued on hemodialysis.  IR was consulted . She underwent left thoracostomy drainage catheter placement on 06/18/22 and thrombolytic instillation on 06/19/2022.  Assessment & Plan:   Principal Problem:   Pleural effusion Active Problems:   Essential hypertension   Thrombocytopenia (HCC)   ESRD (end stage renal disease) (HCC)   DMII (diabetes mellitus, type 2) (HCC)   GERD (gastroesophageal reflux disease)   CAP (community acquired pneumonia)   Delirium  Exudative Pleural effusion: Concern for parapneumonic effusion. IR consulted and performed thoracentesis, yielding 1.2 L of serosanguinous fluid, followed by repeat thoracentesis with minimal amount of serosanguinous fluid. Patient transferred to [facility] for pulmonology consult and consideration of chest tube. CT surgery  recommended CT guided chest tube.  Patient underwent  Left thoracostomy drainage catheter by IR on 06/18/2022.   Patient underwent tube thrombolytics to clear the pleural space by pulmonology on 06/19/21.  Follow pulmonary recommendations.  Completed  course of antibiotic.   Delirium: In the setting of history of multiple vascular injuries and current hospitalization. Likely hospital related delirium.  Improved at this time.  CT head unremarkable.  On Seroquel at nighttime.  Currently at baseline.     Community-acquired pneumonia: Followed by complicated pleural effusion.   Status post chest tube and thrombolytics instillation.  Completed  course of antibiotic.   GERD: Continue Protonix.   Diabetes mellitus type 2: Continue sliding scale insulin, Accu-Cheks, diabetic diet.  Latest POC glucose of 119.  ESRD (end stage renal disease) ([place]) Continue hemodialysis as per nephrology.   Thrombocytopenia ([place]) Improved at this time.   Essential hypertension Continue labetalol.    Chest pain-resolved.  Likely secondary to hypertension.   Severe uncontrolled hypertension-improved at this time.  As needed hydralazine.  Continue labetalol.  DVT prophylaxis: None.  On aspirin now.  Code Status: Full code.  Family Communication:  Spoke with the patient's daughter Ms. Florene Brill on the phone and updated her about the clinical condition of the patient.  Disposition Plan: Home today.  PT pending assessment.   Status is: Inpatient  Remains inpatient appropriate because: Loculated left pleural effusion on chest tube.  Undergoing thrombolytic placement    Consultants:  Nephrology Pulmonology Cardiothoracic surgery  Procedures:  Left thoracocentesis 06/12/22   Antimicrobials:  Was on vancomycin cefepime and Zosyn.  Completed  course of antibiotic.  Subjective:  Today, patient was seen and examined at bedside.  Complains of shortness of breath states little better but he still has cough with increased productive phlegm.  Denies any chest pain.  Seen during hemodialysis.  Patient states that she lives with her son at home.  Objective: Vitals:   06/20/22 1200 06/20/22 1216 06/20/22 1226 06/20/22 1310  BP: (!) 101/51 (!) 115/48 (!) 117/46 (!) 117/49  Pulse: 72 82 82 86  Resp: '14 12 14 18  '$ Temp:   97.7 F (36.5 C) 97.7 F (36.5 C)  TempSrc:    Oral  SpO2: 100%  100% 100%  Weight:   67 kg   Height:  Intake/Output Summary (Last 24 hours) at 06/20/2022 1336 Last data filed at 06/20/2022 1226 Gross per 24 hour  Intake 580 ml  Output  1810 ml  Net -1230 ml    Filed Weights   06/15/22 2238 06/20/22 0816 06/20/22 1226  Weight: 68.8 kg 67.8 kg 67 kg   Body mass index is 23.13 kg/m.  Physical examination: General:  Average built, not in obvious distress, appears deconditioned, HENT:   No scleral pallor or icterus noted. Oral mucosa is dry. Chest:  Diminished breath sounds bilaterally, left chest wall chest tube in place. CVS: S1 &S2 heard. No murmur.  Regular rate and rhythm. Abdomen: Soft, nontender, nondistended.  Bowel sounds are heard.   Extremities: No cyanosis, clubbing or edema.  Peripheral pulses are palpable.  Right upper extremity hemodialysis access. Psych: Alert, awake and oriented, normal mood CNS:  No cranial nerve deficits.  Power equal in all extremities.   Skin: Warm and dry.  Decreased skin turgor.  Data Reviewed: I have personally reviewed following labs and imaging studies  CBC: Recent Labs  Lab 06/14/22 0414 06/15/22 0331 06/17/22 0541 06/18/22 0419 06/19/22 0348 06/19/22 1530 06/20/22 0846  WBC 14.5* 15.7* 12.9* 10.9* 8.1 11.5* 11.6*  NEUTROABS 11.1* 12.5*  --   --   --   --   --   HGB 7.6* 8.6* 8.6* 8.5* 9.2* 8.6* 8.5*  HCT 21.5* 25.5* 25.8* 24.4* 26.4* 25.3* 25.7*  MCV 92.3 93.1 94.5 90.7 92.3 93.4 94.1  PLT 122* 136* 178 156 165 163 824    Basic Metabolic Panel: Recent Labs  Lab 06/14/22 0414 06/15/22 0331 06/15/22 1811 06/17/22 0645 06/19/22 0348  NA 136  --  134* 134* 135  K 4.1  --  4.5 4.6 3.9  CL 95*  --  95* 94* 94*  CO2 26  --  '25 27 27  '$ GLUCOSE 205*  --  116* 139* 259*  BUN 32*  --  26* 20 16  CREATININE 6.96*  --  6.02* 5.25* 4.22*  CALCIUM 7.7*  --  7.7* 7.9* 7.8*  MG 1.8 1.8  --   --  1.7  PHOS  --   --  3.1  --  3.1    GFR: Estimated Creatinine Clearance: 10 mL/min (A) (by C-G formula based on SCr of 4.22 mg/dL (H)). Liver Function Tests: Recent Labs  Lab 06/15/22 1811  ALBUMIN 2.1*    No results for input(s): "LIPASE", "AMYLASE" in the last 168  hours. No results for input(s): "AMMONIA" in the last 168 hours. Coagulation Profile: No results for input(s): "INR", "PROTIME" in the last 168 hours. Cardiac Enzymes: No results for input(s): "CKTOTAL", "CKMB", "CKMBINDEX", "TROPONINI" in the last 168 hours. BNP (last 3 results) No results for input(s): "PROBNP" in the last 8760 hours. HbA1C: No results for input(s): "HGBA1C" in the last 72 hours. CBG: Recent Labs  Lab 06/19/22 1631 06/19/22 2130 06/20/22 0617 06/20/22 0719 06/20/22 1308  GLUCAP 150* 198* 149* 149* 119*    Lipid Profile: No results for input(s): "CHOL", "HDL", "LDLCALC", "TRIG", "CHOLHDL", "LDLDIRECT" in the last 72 hours. Thyroid Function Tests: No results for input(s): "TSH", "T4TOTAL", "FREET4", "T3FREE", "THYROIDAB" in the last 72 hours. Anemia Panel: No results for input(s): "VITAMINB12", "FOLATE", "FERRITIN", "TIBC", "IRON", "RETICCTPCT" in the last 72 hours. Sepsis Labs: No results for input(s): "PROCALCITON", "LATICACIDVEN" in the last 168 hours.  Recent Results (from the past 240 hour(s))  MRSA Next Gen by PCR, Nasal     Status: None  Collection Time: 06/10/22  5:00 PM   Specimen: Nasal Mucosa; Nasal Swab  Result Value Ref Range Status   MRSA by PCR Next Gen NOT DETECTED NOT DETECTED Final    Comment: (NOTE) The GeneXpert MRSA Assay (FDA approved for NASAL specimens only), is one component of a comprehensive MRSA colonization surveillance program. It is not intended to diagnose MRSA infection nor to guide or monitor treatment for MRSA infections. Test performance is not FDA approved in patients less than 32 years old. Performed at [facility], [address], [place], [place] [address]   Aerobic/Anaerobic Culture w Gram Stain (surgical/deep wound)     Status: None (Preliminary result)   Collection Time: 06/18/22  1:51 PM   Specimen: Pleural Fluid  Result Value Ref Range Status   Specimen Description FLUID PLEURAL  Final   Special Requests  NONE  Final   Gram Stain NO WBC SEEN NO ORGANISMS SEEN   Final   Culture   Final    NO GROWTH 2 DAYS NO ANAEROBES ISOLATED; CULTURE IN PROGRESS FOR 5 DAYS Performed at [facility] [address], [place], [place] [address]    Report Status PENDING  Incomplete   Radiology Studies: DG Chest Port 1 View  Result Date: 06/20/2022 CLINICAL DATA:  Assess left chest tube. EXAM: PORTABLE CHEST 1 VIEW COMPARISON:  June 19, 2022 FINDINGS: Stable left basilar opacity and effusion. Stable left chest tube. The cardiomediastinal silhouette is unchanged. No pneumothorax. No other interval changes. IMPRESSION: 1. Stable left chest tube. No pneumothorax. 2. Stable left basilar opacity and effusion. Electronically Signed   By: Dorise Bullion III M.D.   On: 06/20/2022 08:17   DG Chest 1 View  Result Date: 06/19/2022 CLINICAL DATA:  Left pleural effusion, chest pain EXAM: CHEST  1 VIEW COMPARISON:  Previous studies including the examination done earlier today FINDINGS: Transverse diameter of heart is slightly increased. There are no signs of pulmonary edema. There is interval improvement in the aeration in left mid and left lower lung fields suggesting significant decrease in left pleural effusion. Still, there is small to moderate residual effusion part of which appears to be along the lateral aspect of left mid lung field. Patchy infiltrates are seen in left lower lung field. There is no pneumothorax. Left chest tube is noted with its tip in the medial left lower lung field. Loop recorder is seen in left chest wall. Vascular stents are seen in right upper arm. IMPRESSION: There is partial clearing of left pleural effusion. Patchy infiltrate in left lower lung fields suggests atelectasis/pneumonia. Electronically Signed   By: Elmer Picker M.D.   On: 06/19/2022 15:58   DG Chest Port 1 View  Result Date: 06/19/2022 CLINICAL DATA:  Chest tube placement EXAM: PORTABLE CHEST 1 VIEW COMPARISON:  Chest  06/12/2022 FINDINGS: Interval placement of pigtail drainage tube in the left lung base. Moderately large left pleural effusion unchanged. Left lower lobe consolidation unchanged. Mild progression of left upper lobe airspace no pneumothorax Right lung remains clear. Cardiac loop recorder noted. Cardiac enlargement without heart failure. IMPRESSION: Interval placement of pigtail drainage tube in the left lung base. No change in left lower lobe airspace disease. Mild progression of left upper lobe airspace disease. Moderately large left pleural effusion unchanged. Electronically Signed   By: Franchot Gallo M.D.   On: 06/19/2022 07:43   CT [facility] PLEURAL DRAIN W/INDWELL CATH W/IMG GUIDE  Result Date: 06/18/2022 INDICATION: Loculated LEFT pleural effusion. EXAM: CT-GUIDED LEFT THORACOSTOMY DRAINAGE  TUBE PLACEMENT COMPARISON:  CT CHEST, 06/13/2022.  IR ULTRASOUND, 06/12/2022. MEDICATIONS: The patient is currently admitted to the hospital and receiving intravenous antibiotics. The antibiotics were administered within an appropriate time frame prior to the initiation of the procedure. ANESTHESIA/SEDATION: Moderate (conscious) sedation was employed during this procedure. A total of Versed 0.5 mg and Fentanyl 37.5 mcg was administered intravenously. Moderate Sedation Time: 22 minutes. The patient's level of consciousness and vital signs were monitored continuously by radiology nursing throughout the procedure under my direct supervision. CONTRAST:  None COMPLICATIONS: None immediate. PROCEDURE: RADIATION DOSE REDUCTION: This exam was performed according to the departmental dose-optimization program which includes automated exposure control, adjustment of the mA and/or kV according to patient size and/or use of iterative reconstruction technique. Informed written consent was obtained from the patient and/or patient's representative after a discussion of the risks, benefits and alternatives to treatment. The patient was placed  supine on the CT gantry and a pre procedural CT was performed re-demonstrating the known fluid collection within the LEFT chest. The procedure was planned. A timeout was performed prior to the initiation of the procedure. The LEFT chest was prepped and draped in the usual sterile fashion. The overlying soft tissues were anesthetized with 1% lidocaine. Appropriate trajectory was planned with the use of a 22 gauge spinal needle. An 18 gauge trocar needle was advanced into the LEFT chest collection and a short Amplatz super stiff wire was coiled within the collection. Appropriate positioning was confirmed with a limited CT scan. The tract was serially dilated allowing placement of a 12 Fr drainage catheter. Appropriate positioning was confirmed with a limited postprocedural CT scan. 20 mL of thin pleural fluid was aspirated. The tube was connected to a pleura suction canister and sutured in place. A dressing was placed. The patient tolerated the procedure well without immediate post procedural complication. IMPRESSION: Successful CT guided placement of a LEFT 12 Fr non tunneled pleural drainage catheter for loculated pleural effusion. Samples were sent to the laboratory as requested by the ordering clinical team. Michaelle Birks, MD [facility] Radiology Electronically Signed   By: Michaelle Birks M.D.   On: 06/18/2022 15:19    Scheduled Meds:  aspirin  650 mg Oral Daily   Chlorhexidine Gluconate Cloth  6 each Topical Q0600   Chlorhexidine Gluconate Cloth  6 each Topical Q0600   [START ON 06/23/2022] darbepoetin (ARANESP) injection - DIALYSIS  60 mcg Subcutaneous Q Mon-1800   dextromethorphan-guaiFENesin  1 tablet Oral BID   hydrocerin   Topical BID   insulin aspart  0-15 Units Subcutaneous TID WC   insulin aspart  0-5 Units Subcutaneous QHS   insulin glargine-yfgn  10 Units Subcutaneous Daily   labetalol  200 mg Oral BID   pantoprazole  40 mg Oral Daily    QUEtiapine  12.5 mg Oral QHS   sevelamer carbonate  800 mg Oral BID AC   sodium chloride flush  10 mL Intrapleural Q8H   Continuous Infusions:    LOS: 11 days    Flora Lipps, MD [facility] If 7PM-7AM, please contact night-coverage

## 2022-06-20 NOTE — Progress Notes (Signed)
Mobility Specialist Progress Note:   06/20/22 1500  Mobility  Activity Ambulated with assistance to bathroom  Level of Assistance Minimal assist, patient does 75% or more  Assistive Device Front wheel walker  Distance Ambulated (ft) 15 ft  Activity Response Tolerated well  Mobility Referral Yes  $Mobility charge 1 Mobility   Pt requesting to go to BR. Required up to minA to power up from bed. Ambulated with minG assist. Pt still c/o pain from chest tube site. Pt back in bed with all needs met.   Nelta Numbers Mobility Specialist Please contact via SecureChat or  Rehab office at [phone]

## 2022-06-20 NOTE — Progress Notes (Signed)
Patient expressing that there is pain in the left side where chest tube was placed. Patient received pain medication. Patient stated she has had little relief. Contacted Dr. Bridgett Larsson for pain medication. Made multiple assessments on patient chest tube when patient asked me to.Fentanyl was ordered. Contacted trauma RN,Emily for additional assessment assistance. There is no shadowing,air leakage, disconnections,loops or kinks in tubing. Will continue to monitor and update medical staff on progression.

## 2022-06-20 NOTE — Procedures (Signed)
I was present at this dialysis session. I have reviewed the session itself and made appropriate changes.    Having some ASx IDH so UF is limited.  1.2L from chest tube yesterday, 0.9 L in collection today.  3K bath.  Using AVF.     Filed Weights   06/15/22 0532 06/15/22 2238 06/20/22 0816  Weight: 71.7 kg 68.8 kg 67.8 kg    Recent Labs  Lab 06/19/22 0348  NA 135  K 3.9  CL 94*  CO2 27  GLUCOSE 259*  BUN 16  CREATININE 4.22*  CALCIUM 7.8*  PHOS 3.1    Recent Labs  Lab 06/14/22 0414 06/15/22 0331 06/17/22 0541 06/18/22 0419 06/19/22 0348 06/19/22 1530  WBC 14.5* 15.7*   < > 10.9* 8.1 11.5*  NEUTROABS 11.1* 12.5*  --   --   --   --   HGB 7.6* 8.6*   < > 8.5* 9.2* 8.6*  HCT 21.5* 25.5*   < > 24.4* 26.4* 25.3*  MCV 92.3 93.1   < > 90.7 92.3 93.4  PLT 122* 136*   < > 156 165 163   < > = values in this interval not displayed.    Scheduled Meds:  aspirin  650 mg Oral Daily   Chlorhexidine Gluconate Cloth  6 each Topical Q0600   Chlorhexidine Gluconate Cloth  6 each Topical Q0600   [START ON 06/23/2022] darbepoetin (ARANESP) injection - DIALYSIS  60 mcg Subcutaneous Q Mon-1800   dextromethorphan-guaiFENesin  1 tablet Oral BID   hydrocerin   Topical BID   insulin aspart  0-15 Units Subcutaneous TID WC   insulin aspart  0-5 Units Subcutaneous QHS   insulin glargine-yfgn  10 Units Subcutaneous Daily   labetalol  200 mg Oral BID   pantoprazole  40 mg Oral Daily   QUEtiapine  12.5 mg Oral QHS   sevelamer carbonate  800 mg Oral BID AC   sodium chloride flush  10 mL Intrapleural Q8H   Continuous Infusions: PRN Meds:.acetaminophen **OR** acetaminophen, albuterol, fentaNYL (SUBLIMAZE) injection, hydrOXYzine, ondansetron **OR** ondansetron (ZOFRAN) IV, oxyCODONE   [name]  MD 06/20/2022, 9:17 AM

## 2022-06-20 NOTE — Progress Notes (Signed)
UF turned off due to drop in BP, gave 139m NS bolus, pt c/o not feeling well, RN aware

## 2022-06-20 NOTE — Progress Notes (Signed)
BP is 105/44, P 79. RN messaged MD on call to see if Labetalol still needs to be given. Awaiting response.   Foster Simpson Chazz Philson

## 2022-06-20 NOTE — Progress Notes (Signed)
Trauma Event Note  TRN called to bedside by primary RN, pt with new L chest tube inserted 1/3 in IR for pleural effusion. Pt complaining of pain to insertion site with no relief from IV morphine. MD made aware of unrelieved pain and ordered fentanyl, which had not yet been given on my assessment. Pigtail chest tube site with small amount of blood to dressing, sutures intact, no leaks noted in sahara or tubing. To wall suction. Dark blood in sahara. VS stable on last check, respirations easy and unlabored, BS clear to auscultation. Reassured patient that chest tube looks appropriate, and some pain is expected. Primary RN to administer fentanyl. TRNs available 24 hours if additional assistance is needed.     Last imported Vital Signs BP (!) 138/57 (BP Location: Left Arm)   Pulse 75   Temp 98 F (36.7 C) (Oral)   Resp 20   Ht '5\' 7"'$  (1.702 m)   Wt 151 lb 10.8 oz (68.8 kg)   SpO2 100%   BMI 23.76 kg/m   Trending CBC Recent Labs    06/18/22 0419 06/19/22 0348 06/19/22 1530  WBC 10.9* 8.1 11.5*  HGB 8.5* 9.2* 8.6*  HCT 24.4* 26.4* 25.3*  PLT 156 165 163    Trending BMET Recent Labs    06/17/22 0645 06/19/22 0348  NA 134* 135  K 4.6 3.9  CL 94* 94*  CO2 27 27  BUN 20 16  CREATININE 5.25* 4.22*  GLUCOSE 139* 259*      [name] O [name]  Trauma Response RN  Please call TRN at [phone] for further assistance.

## 2022-06-20 NOTE — Procedures (Signed)
Pleural Fibrinolytic Administration Procedure Note  ARVELLA MASSINGALE  [id number]  1941/02/28  Date:06/20/22  Time:6:29 PM   Provider Performing:Aerik Polan Naomie Dean   Procedure: Pleural Fibrinolysis Subsequent day [phone])  Indication(s) Fibrinolysis of complicated pleural effusion  Consent Risks of the procedure as well as the alternatives and risks of each were explained to the patient and/or caregiver.  Consent for the procedure was obtained.   Anesthesia None   Time Out Verified patient identification, verified procedure, site/side was marked, verified correct patient position, special equipment/implants available, medications/allergies/relevant history reviewed, required imaging and test results available.   Sterile Technique Hand hygiene, gloves   Procedure Description Existing pleural catheter was cleaned and accessed in sterile manner.  '10mg'$  of tPA in 30cc of saline and '5mg'$  of dornase in 30cc of sterile water were injected into pleural space using existing pleural catheter.  Catheter will be clamped for 1 hour and then placed back to suction.   Complications/Tolerance None; patient tolerated the procedure well.   EBL None   Specimen(s) None  Julian Hy, DO 06/20/22 6:29 PM [facility]  For contact information, see Amion. If no response to pager, please call PCCM consult pager. After hours, 7PM- 7AM, please call Elink.

## 2022-06-21 ENCOUNTER — Inpatient Hospital Stay (HOSPITAL_COMMUNITY): Payer: 59

## 2022-06-21 DIAGNOSIS — Z4682 Encounter for fitting and adjustment of non-vascular catheter: Secondary | ICD-10-CM | POA: Diagnosis not present

## 2022-06-21 DIAGNOSIS — J90 Pleural effusion, not elsewhere classified: Secondary | ICD-10-CM

## 2022-06-21 DIAGNOSIS — J942 Hemothorax: Secondary | ICD-10-CM

## 2022-06-21 LAB — BASIC METABOLIC PANEL
Anion gap: 12 (ref 5–15)
BUN: 13 mg/dL (ref 8–23)
CO2: 29 mmol/L (ref 22–32)
Calcium: 7.8 mg/dL — ABNORMAL LOW (ref 8.9–10.3)
Chloride: 96 mmol/L — ABNORMAL LOW (ref 98–111)
Creatinine, Ser: 4.43 mg/dL — ABNORMAL HIGH (ref 0.44–1.00)
GFR, Estimated: 9 mL/min — ABNORMAL LOW (ref >60)
Glucose, Bld: 78 mg/dL (ref 70–99)
Potassium: 4.4 mmol/L (ref 3.5–5.1)
Sodium: 137 mmol/L (ref 135–145)

## 2022-06-21 LAB — CBC
HCT: 23.9 % — ABNORMAL LOW (ref 36.0–46.0)
HCT: 21.3 % — ABNORMAL LOW (ref 36.0–46.0)
Hemoglobin: 8 g/dL — ABNORMAL LOW (ref 12.0–15.0)
Hemoglobin: 7 g/dL — ABNORMAL LOW (ref 12.0–15.0)
MCH: 31.7 pg (ref 26.0–34.0)
MCH: 31.4 pg (ref 26.0–34.0)
MCHC: 33.5 g/dL (ref 30.0–36.0)
MCHC: 32.9 g/dL (ref 30.0–36.0)
MCV: 94.8 fL (ref 80.0–100.0)
MCV: 95.5 fL (ref 80.0–100.0)
Platelets: 174 10*3/uL (ref 150–400)
Platelets: 182 10*3/uL (ref 150–400)
RBC: 2.52 MIL/uL — ABNORMAL LOW (ref 3.87–5.11)
RBC: 2.23 MIL/uL — ABNORMAL LOW (ref 3.87–5.11)
RDW: 16.8 % — ABNORMAL HIGH (ref 11.5–15.5)
RDW: 16.9 % — ABNORMAL HIGH (ref 11.5–15.5)
WBC: 11.5 10*3/uL — ABNORMAL HIGH (ref 4.0–10.5)
WBC: 11.4 10*3/uL — ABNORMAL HIGH (ref 4.0–10.5)
nRBC: 0 % (ref 0.0–0.2)
nRBC: 0 % (ref 0.0–0.2)

## 2022-06-21 LAB — GLUCOSE, CAPILLARY
Glucose-Capillary: 73 mg/dL (ref 70–99)
Glucose-Capillary: 104 mg/dL — ABNORMAL HIGH (ref 70–99)
Glucose-Capillary: 133 mg/dL — ABNORMAL HIGH (ref 70–99)
Glucose-Capillary: 173 mg/dL — ABNORMAL HIGH (ref 70–99)

## 2022-06-21 LAB — PROTIME-INR
INR: 1.3 — ABNORMAL HIGH (ref 0.8–1.2)
Prothrombin Time: 15.9 seconds — ABNORMAL HIGH (ref 11.4–15.2)

## 2022-06-21 LAB — FIBRINOGEN: Fibrinogen: 697 mg/dL — ABNORMAL HIGH (ref 210–475)

## 2022-06-21 LAB — PREPARE RBC (CROSSMATCH)

## 2022-06-21 LAB — MAGNESIUM: Magnesium: 1.6 mg/dL — ABNORMAL LOW (ref 1.7–2.4)

## 2022-06-21 MED ORDER — TRANEXAMIC ACID-NACL 1000-0.7 MG/100ML-% IV SOLN
1000.0000 mg | Freq: Once | INTRAVENOUS | Status: AC
  Administered 2022-06-21: 1000 mg via INTRAVENOUS
  Filled 2022-06-21: qty 100

## 2022-06-21 MED ORDER — PHYTONADIONE 5 MG PO TABS
5.0000 mg | ORAL_TABLET | Freq: Once | ORAL | Status: AC
  Administered 2022-06-21: 5 mg via ORAL
  Filled 2022-06-21: qty 1

## 2022-06-21 MED ORDER — SODIUM CHLORIDE 0.9% IV SOLUTION: Freq: Once | INTRAVENOUS | Status: AC

## 2022-06-21 MED ORDER — SODIUM CHLORIDE 0.9 % IV SOLN
20.0000 ug | Freq: Once | INTRAVENOUS | Status: AC
  Administered 2022-06-21: 20 ug via INTRAVENOUS
  Filled 2022-06-21: qty 5

## 2022-06-21 NOTE — Progress Notes (Addendum)
Follow up Hb 7.0-- 1 g drop since this morning correlating with high volume of bloody chest tube output. Will give 1 unit pRBCs now due to concern that anemia is related to her lethargy and weakness today. Fibrinogen and INR not requiring intervention currently.  Julian Hy, DO 06/21/22 5:41 PM [facility]  For contact information, see Amion. If no response to pager, please call PCCM consult pager. After hours, 7PM- 7AM, please call Elink.   [name] updated over phone. She agrees with blood transfusion for Hb 7 since she has had high CT output and has been more lethargic today.  Julian Hy, DO 06/21/22 5:47 PM [facility]

## 2022-06-21 NOTE — Progress Notes (Signed)
PROGRESS NOTE   [name]  QVZ:[id number] DOB: 05/28/1941 DOA: 06/09/2022  PCP: [name], MD  Brief Narrative:  This 82 yrs old female with history of ESRD on HD, diabetes mellitus type 2, GERD, hypertension, stroke. Patient presented secondary to shortness of breath and found to have recurrent pneumonia with associated left sided pleural effusion. Empiric antibiotics initiated and patient transferred to [facility] for consideration of chest tube placement. During admission, patient developed hospital delirium.  Patient continued on hemodialysis.  IR was consulted . She underwent left thoracostomy drainage catheter placement on 06/18/22 and thrombolytic instillation on 06/19/2022.  06/21/2022: Patient seen.  Patient would not or could not communicate freely with me.  Pulmonary recommendation noted.  Chest tube is in place.  Assessment & Plan:   Principal Problem:   Pleural effusion Active Problems:   Essential hypertension   Thrombocytopenia (HCC)   ESRD (end stage renal disease) (HCC)   DMII (diabetes mellitus, type 2) (HCC)   GERD (gastroesophageal reflux disease)   CAP (community acquired pneumonia)   Delirium   Need for management of chest tube  Exudative Pleural effusion: Concern for parapneumonic effusion. IR consulted and performed thoracentesis, yielding 1.2 L of serosanguinous fluid, followed by repeat thoracentesis with minimal amount of serosanguinous fluid. Patient transferred to [facility] for pulmonology consult and consideration of chest tube. CT surgery  recommended CT guided chest tube.  Patient underwent  Left thoracostomy drainage catheter by IR on 06/18/2022.   Patient underwent tube thrombolytics to clear the pleural space by pulmonology on 06/19/21.  Continue to follow pulmonary recommendations.  Completed  course of antibiotic. 06/21/2021: Continue to follow pulmonary recommendations.   Delirium: In the setting of history of multiple vascular  injuries and current hospitalization. Likely hospital related delirium.  Improved at this time.  CT head unremarkable.  On Seroquel at nighttime.  Currently at baseline.   06/21/2022: Continue to manage expectantly.   Community-acquired pneumonia: Followed by complicated pleural effusion.  Status post chest tube and thrombolytics instillation.  Completed  course of antibiotic.   GERD: Continue Protonix.   Diabetes mellitus type 2: Continue sliding scale insulin, Accu-Cheks, diabetic diet.  Latest POC glucose of 119.  ESRD (end stage renal disease) ([place]) Continue hemodialysis as per nephrology.   Thrombocytopenia ([place]) Improved at this time.   Essential hypertension Continue labetalol.    Chest pain-resolved.  Likely secondary to hypertension.   Severe uncontrolled hypertension-improved at this time.  As needed hydralazine.  Continue labetalol.  DVT prophylaxis: None.  On aspirin now.  Code Status: Full code.  Family Communication:    Disposition Plan:  Status is: Inpatient  Remains inpatient appropriate because: Loculated left pleural effusion on chest tube.  Undergoing thrombolytic placement    Consultants:  Nephrology Pulmonology Cardiothoracic surgery  Procedures:  Left thoracocentesis 06/12/22   Antimicrobials:  Was on vancomycin cefepime and Zosyn.  Completed  course of antibiotic.  Subjective: -No new complaints. No significant history from patient.     Objective: Vitals:   06/20/22 1518 06/20/22 2038 06/21/22 0543 06/21/22 0814  BP: (!) 110/43 (!) 105/44 (!) 100/44 (!) 100/45  Pulse:  79 79 80  Resp: '18 18 18 17  '$ Temp: 98.4 F (36.9 C) 98.3 F (36.8 C) 98.5 F (36.9 C) 98.2 F (36.8 C)  TempSrc: Oral Oral Oral Oral  SpO2: 100% 100% 98% 100%  Weight:      Height:        Intake/Output Summary (Last 24 hours)  at 06/21/2022 1300 Last data filed at 06/21/2022 1101 Gross per 24 hour  Intake 370 ml  Output 390 ml  Net -20 ml    Filed Weights    06/15/22 2238 06/20/22 0816 06/20/22 1226  Weight: 68.8 kg 67.8 kg 67 kg   Body mass index is 23.13 kg/m.  Physical examination: General: Not in any distress.  Awake and alert.  Not much communication from patient.   HEENT:   Patient is pale.   Chest: Decreased air entry.   CVS: S1 &S2 heard.  Abdomen: Soft, nontender, nondistended.  Bowel sounds are heard.   Extremities: No leg edema. CNS: Awake and alert.  Moves all extremities.    Data Reviewed: I have personally reviewed following labs and imaging studies  CBC: Recent Labs  Lab 06/15/22 0331 06/17/22 0541 06/18/22 0419 06/19/22 0348 06/19/22 1530 06/20/22 0846 06/21/22 0736  WBC 15.7*   < > 10.9* 8.1 11.5* 11.6* 11.5*  NEUTROABS 12.5*  --   --   --   --   --   --   HGB 8.6*   < > 8.5* 9.2* 8.6* 8.5* 8.0*  HCT 25.5*   < > 24.4* 26.4* 25.3* 25.7* 23.9*  MCV 93.1   < > 90.7 92.3 93.4 94.1 94.8  PLT 136*   < > 156 165 163 170 174   < > = values in this interval not displayed.    Basic Metabolic Panel: Recent Labs  Lab 06/15/22 0331 06/15/22 1811 06/17/22 0645 06/19/22 0348 06/20/22 0846 06/21/22 0736  NA  --  134* 134* 135 134* 137  K  --  4.5 4.6 3.9 4.6 4.4  CL  --  95* 94* 94* 94* 96*  CO2  --  '25 27 27 26 29  '$ GLUCOSE  --  116* 139* 259* 170* 78  BUN  --  26* '20 16 23 13  '$ CREATININE  --  6.02* 5.25* 4.22* 5.88* 4.43*  CALCIUM  --  7.7* 7.9* 7.8* 7.9* 7.8*  MG 1.8  --   --  1.7  --  1.6*  PHOS  --  3.1  --  3.1 4.0  --     GFR: Estimated Creatinine Clearance: 9.5 mL/min (A) (by C-G formula based on SCr of 4.43 mg/dL (H)). Liver Function Tests: Recent Labs  Lab 06/15/22 1811 06/20/22 0846  ALBUMIN 2.1* 2.0*    No results for input(s): "LIPASE", "AMYLASE" in the last 168 hours. No results for input(s): "AMMONIA" in the last 168 hours. Coagulation Profile: No results for input(s): "INR", "PROTIME" in the last 168 hours. Cardiac Enzymes: No results for input(s): "CKTOTAL", "CKMB", "CKMBINDEX",  "TROPONINI" in the last 168 hours. BNP (last 3 results) No results for input(s): "PROBNP" in the last 8760 hours. HbA1C: No results for input(s): "HGBA1C" in the last 72 hours. CBG: Recent Labs  Lab 06/20/22 1308 06/20/22 1609 06/20/22 2037 06/21/22 0735 06/21/22 1113  GLUCAP 119* 289* 113* 73 104*    Lipid Profile: No results for input(s): "CHOL", "HDL", "LDLCALC", "TRIG", "CHOLHDL", "LDLDIRECT" in the last 72 hours. Thyroid Function Tests: No results for input(s): "TSH", "T4TOTAL", "FREET4", "T3FREE", "THYROIDAB" in the last 72 hours. Anemia Panel: No results for input(s): "VITAMINB12", "FOLATE", "FERRITIN", "TIBC", "IRON", "RETICCTPCT" in the last 72 hours. Sepsis Labs: No results for input(s): "PROCALCITON", "LATICACIDVEN" in the last 168 hours.  Recent Results (from the past 240 hour(s))  Aerobic/Anaerobic Culture w Gram Stain (surgical/deep wound)     Status: None (Preliminary result)  Collection Time: 06/18/22  1:51 PM   Specimen: Pleural Fluid  Result Value Ref Range Status   Specimen Description FLUID PLEURAL  Final   Special Requests NONE  Final   Gram Stain NO WBC SEEN NO ORGANISMS SEEN   Final   Culture   Final    NO GROWTH 2 DAYS NO ANAEROBES ISOLATED; CULTURE IN PROGRESS FOR 5 DAYS Performed at [facility], [address] [place], [place] [address]    Report Status PENDING  Incomplete   Radiology Studies: DG CHEST PORT 1 VIEW  Result Date: 06/21/2022 CLINICAL DATA:  Chest tube placement EXAM: PORTABLE CHEST 1 VIEW COMPARISON:  June 20, 2022 FINDINGS: The left chest tube is stable. Opacity and probable loculated effusion on the left are similar in the interval. No pneumothorax. The cardiomediastinal silhouette is unchanged. The right lung remains clear. IMPRESSION: 1. The left chest tube remains in place. No pneumothorax. 2. The opacity and loculated effusion on the left are similar in the interval. Electronically Signed   By: Dorise Bullion III  M.D.   On: 06/21/2022 08:39   DG Chest Port 1 View  Result Date: 06/20/2022 CLINICAL DATA:  Assess left chest tube. EXAM: PORTABLE CHEST 1 VIEW COMPARISON:  June 19, 2022 FINDINGS: Stable left basilar opacity and effusion. Stable left chest tube. The cardiomediastinal silhouette is unchanged. No pneumothorax. No other interval changes. IMPRESSION: 1. Stable left chest tube. No pneumothorax. 2. Stable left basilar opacity and effusion. Electronically Signed   By: Dorise Bullion III M.D.   On: 06/20/2022 08:17   DG Chest 1 View  Result Date: 06/19/2022 CLINICAL DATA:  Left pleural effusion, chest pain EXAM: CHEST  1 VIEW COMPARISON:  Previous studies including the examination done earlier today FINDINGS: Transverse diameter of heart is slightly increased. There are no signs of pulmonary edema. There is interval improvement in the aeration in left mid and left lower lung fields suggesting significant decrease in left pleural effusion. Still, there is small to moderate residual effusion part of which appears to be along the lateral aspect of left mid lung field. Patchy infiltrates are seen in left lower lung field. There is no pneumothorax. Left chest tube is noted with its tip in the medial left lower lung field. Loop recorder is seen in left chest wall. Vascular stents are seen in right upper arm. IMPRESSION: There is partial clearing of left pleural effusion. Patchy infiltrate in left lower lung fields suggests atelectasis/pneumonia. Electronically Signed   By: Elmer Picker M.D.   On: 06/19/2022 15:58    Scheduled Meds:  aspirin  650 mg Oral Daily   Chlorhexidine Gluconate Cloth  6 each Topical Q0600   Chlorhexidine Gluconate Cloth  6 each Topical Q0600   [START ON 06/23/2022] darbepoetin (ARANESP) injection - DIALYSIS  60 mcg Subcutaneous Q Mon-1800   dextromethorphan-guaiFENesin  1 tablet Oral BID   heparin injection (subcutaneous)  5,000 Units Subcutaneous Q12H   hydrocerin   Topical BID    insulin aspart  0-15 Units Subcutaneous TID WC   insulin aspart  0-5 Units Subcutaneous QHS   insulin glargine-yfgn  10 Units Subcutaneous Daily   labetalol  200 mg Oral BID   pantoprazole  40 mg Oral Daily   QUEtiapine  12.5 mg Oral QHS   sevelamer carbonate  800 mg Oral BID AC   sodium chloride flush  10 mL Intrapleural Q8H   Continuous Infusions:    LOS: 12 days    Yehuda Savannah I  Marthenia Rolling, MD [facility] If 7PM-7AM, please contact night-coverage

## 2022-06-21 NOTE — Plan of Care (Signed)
  Problem: Respiratory: Goal: Ability to maintain adequate ventilation will improve Outcome: Progressing   Problem: Clinical Measurements: Goal: Ability to maintain clinical measurements within normal limits will improve Outcome: Progressing Goal: Will remain free from infection Outcome: Progressing Goal: Respiratory complications will improve Outcome: Progressing

## 2022-06-21 NOTE — Progress Notes (Signed)
[facility]PHROLOG[facility] PROGRESS NOTE  Assessment/ Plan: Dialyzes at [facility] MWF  4 hours  EDW 75.5. HD Bath 2K, Dialyzer unknown, Heparin no. Access AVF right-  has post bleeding a lot.  Calcitriol 1 mcg q tx   # SOB/pleural effusion- markedly improved after HD.  off of supplemental oxygen.  Has L sided loculated pleural effusion, s/p chest tube 06/18/22 with IR antifibrinolytic 1/3 and 1/5 with PCCM.   #ESRD - On schedule MWF, next treatment 1/8.   # HCAP- on vanc/ cefepime.  #HTN / volume: BP improved on labetalol.  Volume management with HD.  # Anemia- esa started 12/31, transfuse prn.  #Delirium/agitation- thought to be secondary to cefepime which ahs been d/c'd. CTH without acute changes. Per primary.,  Seems in room  Subjective:  HD yesterday 0.9L UF Is having significant chest tube drainage Repeat fibrinolytic placed into pleural space yesterday No complaints this morning  Objective Vital signs in last 24 hours: Vitals:   06/20/22 1518 06/20/22 2038 06/21/22 0543 06/21/22 0814  BP: (!) 110/43 (!) 105/44 (!) 100/44 (!) 100/45  Pulse:  79 79 80  Resp: '18 18 18 17  '$ Temp: 98.4 F (36.9 C) 98.3 F (36.8 C) 98.5 F (36.9 C) 98.2 F (36.8 C)  TempSrc: Oral Oral Oral Oral  SpO2: 100% 100% 98% 100%  Weight:      Height:       Weight change:   Intake/Output Summary (Last 24 hours) at 06/21/2022 1029 Last data filed at 06/21/2022 0857 Gross per 24 hour  Intake 360 ml  Output 1070 ml  Net -710 ml        Labs: RENAL PANEL Recent Labs    05/01/22 2235 05/09/22 0659 06/02/22 0740 06/03/22 0329 06/04/22 0344 06/09/22 2000 06/10/22 0353 06/11/22 0345 06/12/22 0327 06/13/22 0157 06/14/22 0414 06/15/22 0331 06/15/22 1811 06/17/22 0645 06/19/22 0348 06/20/22 0846 06/21/22 0736  NA 140   < > 137   < > 136 139 139 137  --   --  136  --  134* 134* 135 134* 137  K 3.7   < > 4.3   < > 3.2* 3.5 3.9 3.7  --   --  4.1  --  4.5 4.6 3.9 4.6  4.4  CL 100   < > 96*   < > 96* 99 101 97*  --   --  95*  --  95* 94* 94* 94* 96*  CO2 26   < > 28   < > '28 28 25 28  '$ --   --  26  --  '25 27 27 26 29  '$ GLUCOSE 275*   < > 220*   < > 146* 187* 184* 210*  --   --  205*  --  116* 139* 259* 170* 78  BUN 27*   < > 41*   < > 34* 28* 31* 17  --   --  32*  --  26* '20 16 23 13  '$ CREATININE 7.01*   < > 8.11*   < > 6.42* 7.43* 7.73* 4.54*  --   --  6.96*  --  6.02* 5.25* 4.22* 5.88* 4.43*  CALCIUM 8.1*   < > 8.0*   < > 7.9* 8.3* 7.9* 7.7*  --   --  7.7*  --  7.7* 7.9* 7.8* 7.9* 7.8*  MG  --   --  2.2  --  1.9  --  2.1 1.7 1.7 1.6* 1.8 1.8  --   --  1.7  --  1.6*  PHOS  --   --  4.6  --   --   --   --  2.2*  --   --   --   --  3.1  --  3.1 4.0  --   ALBUMIN 3.3*  --  3.0*  --   --  3.1* 2.8* 2.5*  --   --   --   --  2.1*  --   --  2.0*  --    < > = values in this interval not displayed.      Liver Function Tests: Recent Labs  Lab 06/15/22 1811 06/20/22 0846  ALBUMIN 2.1* 2.0*    No results for input(s): "LIPASE", "AMYLASE" in the last 168 hours. No results for input(s): "AMMONIA" in the last 168 hours. CBC: Recent Labs    06/15/22 0331 06/15/22 0719 06/17/22 0541 06/18/22 0419 06/19/22 0348 06/19/22 1530 06/20/22 0846 06/21/22 0736  HGB 8.6*  --    < > 8.5* 9.2* 8.6* 8.5* 8.0*  MCV 93.1  --    < > 90.7 92.3 93.4 94.1 94.8  FERRITIN 1,418*  --   --   --   --   --   --   --   TIBC  --  136*  --   --   --   --   --   --   IRON  --  31  --   --   --   --   --   --    < > = values in this interval not displayed.     Cardiac Enzymes: No results for input(s): "CKTOTAL", "CKMB", "CKMBINDEX", "TROPONINI" in the last 168 hours. CBG: Recent Labs  Lab 06/20/22 0719 06/20/22 1308 06/20/22 1609 06/20/22 2037 06/21/22 0735  GLUCAP 149* 119* 289* 113* 73     Iron Studies:  No results for input(s): "IRON", "TIBC", "TRANSFERRIN", "FERRITIN" in the last 72 hours.  Studies/Results: DG CHEST PORT 1 VIEW  Result Date: 06/21/2022 CLINICAL  DATA:  Chest tube placement EXAM: PORTABLE CHEST 1 VIEW COMPARISON:  June 20, 2022 FINDINGS: The left chest tube is stable. Opacity and probable loculated effusion on the left are similar in the interval. No pneumothorax. The cardiomediastinal silhouette is unchanged. The right lung remains clear. IMPRESSION: 1. The left chest tube remains in place. No pneumothorax. 2. The opacity and loculated effusion on the left are similar in the interval. Electronically Signed   By: Dorise Bullion III M.D.   On: 06/21/2022 08:39   DG Chest Port 1 View  Result Date: 06/20/2022 CLINICAL DATA:  Assess left chest tube. EXAM: PORTABLE CHEST 1 VIEW COMPARISON:  June 19, 2022 FINDINGS: Stable left basilar opacity and effusion. Stable left chest tube. The cardiomediastinal silhouette is unchanged. No pneumothorax. No other interval changes. IMPRESSION: 1. Stable left chest tube. No pneumothorax. 2. Stable left basilar opacity and effusion. Electronically Signed   By: Dorise Bullion III M.D.   On: 06/20/2022 08:17   DG Chest 1 View  Result Date: 06/19/2022 CLINICAL DATA:  Left pleural effusion, chest pain EXAM: CHEST  1 VIEW COMPARISON:  Previous studies including the examination done earlier today FINDINGS: Transverse diameter of heart is slightly increased. There are no signs of pulmonary edema. There is interval improvement in the aeration in left mid and left lower lung fields suggesting significant decrease in left pleural effusion. Still, there is small to moderate residual effusion part of which appears  to be along the lateral aspect of left mid lung field. Patchy infiltrates are seen in left lower lung field. There is no pneumothorax. Left chest tube is noted with its tip in the medial left lower lung field. Loop recorder is seen in left chest wall. Vascular stents are seen in right upper arm. IMPRESSION: There is partial clearing of left pleural effusion. Patchy infiltrate in left lower lung fields suggests  atelectasis/pneumonia. Electronically Signed   By: Elmer Picker M.D.   On: 06/19/2022 15:58    Medications: Infusions:    Scheduled Medications:  aspirin  650 mg Oral Daily   Chlorhexidine Gluconate Cloth  6 each Topical Q0600   Chlorhexidine Gluconate Cloth  6 each Topical Q0600   [START ON 06/23/2022] darbepoetin (ARANESP) injection - DIALYSIS  60 mcg Subcutaneous Q Mon-1800   dextromethorphan-guaiFENesin  1 tablet Oral BID   heparin injection (subcutaneous)  5,000 Units Subcutaneous Q12H   hydrocerin   Topical BID   insulin aspart  0-15 Units Subcutaneous TID WC   insulin aspart  0-5 Units Subcutaneous QHS   insulin glargine-yfgn  10 Units Subcutaneous Daily   labetalol  200 mg Oral BID   pantoprazole  40 mg Oral Daily   QUEtiapine  12.5 mg Oral QHS   sevelamer carbonate  800 mg Oral BID AC   sodium chloride flush  10 mL Intrapleural Q8H    have reviewed scheduled and prn medications.  Physical Exam: General:NAD, comfortable Heart:RRR, s1s2 nl Lungs:clear b/l, no crackle Abdomen:soft, Non-tender, non-distended Extremities:No edema Dialysis Access: Right upper extremity AV fistula has thrill and bruit present  Elzena Muston B Moneisha Vosler 06/21/2022,10:29 AM  LOS: 12 days

## 2022-06-21 NOTE — Progress Notes (Signed)
NAME:  [name], MRN:  [id number], DOB:  10/23/1940, LOS: 12 ADMISSION DATE:  06/09/2022, CONSULTATION DATE:  06/17/17/2023 REFERRING MD:  Shawna Clamp, MD, CHIEF COMPLAINT:  left parapneumonic effusion   History of Present Illness:  82 year old female with a extensive past medical history is well-documented below is most significant for end-stage renal disease, history of stroke dyspnea left-sided pleural effusion that was drained on 06/10/2022 100 cc but subsequent follow-up radiographic data reveals enlargement of the effusion.  There was concern that this may be a clot.  She has been transferred to [facility] for further evaluation and treatment. Pulmonary critical care is recommended interventional radiology be consulted for any further thoracic interventions.  Underwent left thoracostomy with IR on 06/18/2022 and 12 fr pleural catheter placed.  Pertinent  Medical History  ESRD on HD, T2DM, CVA with residual R sided weakness, HTN, GERD, history of upper GI bleed with gastric banding, duodenal neuroendocrine tumor   Significant Hospital Events: Including procedures, antibiotic start and stop dates in addition to other pertinent events   06/18/2021 12 Fr pleural drain placed by IR 1/5 intrapleural lytics  Interim History / Subjective:  Increased chest tube output this morning per RN> seems to be picking up today per RN (about 750cc out on day shift). ~150 cc out overnight after lytics. Afebrile overinght.   Objective   Blood pressure (!) 100/45, pulse 80, temperature 98.2 F (36.8 C), temperature source Oral, resp. rate 17, height '5\' 7"'$  (1.702 m), weight 67 kg, SpO2 100 %.        Intake/Output Summary (Last 24 hours) at 06/21/2022 1610 Last data filed at 06/21/2022 1405 Gross per 24 hour  Intake 370 ml  Output 530 ml  Net -160 ml    Filed Weights   06/15/22 2238 06/20/22 0816 06/20/22 1226  Weight: 68.8 kg 67.8 kg 67 kg    Examination: General: fatigued appearing  woman lying in bed in NAD HENT: [place]/AT, eyes anicteric, pale conjunctiva PULM: breathing comfortably on RA, very dark thin bloody output from chest tube, free-flowing. When proximal chest tube was flushed, able to pull back 10cc of dark blood immediately. Some clots in blood in the tubing, but overall minimal clots.  CV: normal perfusion GI: soft, NT Extremities: no edema, fistula LUE Neuro: more sleepy today, moving extremities, answering some questions  Chest tube output: 750cc today  WBC 11.5 H/H 8/23.9 Platelets 174 BUN 13 Cr 4.43  CXR personally reviewed> worsening opacity in left lung, pleural effusion unchanged.   pleural fluid cultures: NGTD  Resolved Hospital Problem list     Assessment & Plan:  Recurrent left exudative pleural effusion> now hemothorax after intrapleural lytics Suspect due to mutifocal pneumonia in mid December. Remains afebrile, leukocytosis resolved. On room air. Cultures negative, MRSA swab negative. IR placed chest tube on 06/18/2022.  -No additional TPA or dornase due to development of hemothorax -Chest tube con't to suction -STAT CBC, INR, fibrinogen. Has UTD type and screen. Anticipate she may need a blood transfusion.  -DDAVP and TXA now empirically -Con't antibiotics -Chest CT tomorrow with contrast- d/w nephrology to do Sunday night before HD Monday  Itching -Atarax PRN  ESRD -HD on 1/8  Rest of care per primary.  [name] updated via phone. She verbally consented for blood transfusion, which was witnessed by [employer].  Best Practice (right click and "Reselect all SmartList Selections" daily)   Diet/type: Regular consistency (see orders) DVT prophylaxis: SCD GI prophylaxis: PPI Lines: N/A  Foley:  N/A Code Status:  full code  Labs   CBC: Recent Labs  Lab 06/15/22 0331 06/17/22 0541 06/18/22 0419 06/19/22 0348 06/19/22 1530 06/20/22 0846 06/21/22 0736  WBC 15.7*   < > 10.9* 8.1 11.5* 11.6* 11.5*  NEUTROABS  12.5*  --   --   --   --   --   --   HGB 8.6*   < > 8.5* 9.2* 8.6* 8.5* 8.0*  HCT 25.5*   < > 24.4* 26.4* 25.3* 25.7* 23.9*  MCV 93.1   < > 90.7 92.3 93.4 94.1 94.8  PLT 136*   < > 156 165 163 170 174   < > = values in this interval not displayed.     Basic Metabolic Panel: Recent Labs  Lab 06/15/22 0331 06/15/22 1811 06/17/22 0645 06/19/22 0348 06/20/22 0846 06/21/22 0736  NA  --  134* 134* 135 134* 137  K  --  4.5 4.6 3.9 4.6 4.4  CL  --  95* 94* 94* 94* 96*  CO2  --  '25 27 27 26 29  '$ GLUCOSE  --  116* 139* 259* 170* 78  BUN  --  26* '20 16 23 13  '$ CREATININE  --  6.02* 5.25* 4.22* 5.88* 4.43*  CALCIUM  --  7.7* 7.9* 7.8* 7.9* 7.8*  MG 1.8  --   --  1.7  --  1.6*  PHOS  --  3.1  --  3.1 4.0  --     GFR: Estimated Creatinine Clearance: 9.5 mL/min (A) (by C-G formula based on SCr of 4.43 mg/dL (H)). Recent Labs  Lab 06/19/22 0348 06/19/22 1530 06/20/22 0846 06/21/22 0736  WBC 8.1 11.5* 11.6* 11.5*   Julian Hy, DO 06/21/22 4:10 PM [facility]  For contact information, see Amion. If no response to pager, please call PCCM consult pager. After hours, 7PM- 7AM, please call Elink.

## 2022-06-22 DIAGNOSIS — J942 Hemothorax: Secondary | ICD-10-CM | POA: Diagnosis not present

## 2022-06-22 DIAGNOSIS — Z4682 Encounter for fitting and adjustment of non-vascular catheter: Secondary | ICD-10-CM | POA: Diagnosis not present

## 2022-06-22 DIAGNOSIS — D62 Acute posthemorrhagic anemia: Secondary | ICD-10-CM

## 2022-06-22 DIAGNOSIS — J90 Pleural effusion, not elsewhere classified: Secondary | ICD-10-CM | POA: Diagnosis not present

## 2022-06-22 LAB — BPAM RBC
Blood Product Expiration Date: 202402012359
ISSUE DATE / TIME: 202401061812
Unit Type and Rh: 5100

## 2022-06-22 LAB — TYPE AND SCREEN
ABO/RH(D): O POS
Antibody Screen: NEGATIVE
Unit division: 0

## 2022-06-22 LAB — GLUCOSE, CAPILLARY
Glucose-Capillary: 174 mg/dL — ABNORMAL HIGH (ref 70–99)
Glucose-Capillary: 186 mg/dL — ABNORMAL HIGH (ref 70–99)
Glucose-Capillary: 183 mg/dL — ABNORMAL HIGH (ref 70–99)
Glucose-Capillary: 161 mg/dL — ABNORMAL HIGH (ref 70–99)

## 2022-06-22 LAB — HEMOGLOBIN AND HEMATOCRIT, BLOOD
HCT: 26.1 % — ABNORMAL LOW (ref 36.0–46.0)
Hemoglobin: 9.1 g/dL — ABNORMAL LOW (ref 12.0–15.0)

## 2022-06-22 NOTE — Progress Notes (Signed)
PROGRESS NOTE   [name]  YQI:[id number] DOB: 08/04/1940 DOA: 06/09/2022  PCP: [name], MD  Brief Narrative:  This 81 yrs old female with history of ESRD on HD, diabetes mellitus type 2, GERD, hypertension, stroke. Patient presented secondary to shortness of breath and found to have recurrent pneumonia with associated left sided pleural effusion. Empiric antibiotics initiated and patient transferred to [facility] for consideration of chest tube placement. During admission, patient developed hospital delirium.  Patient continued on hemodialysis.  IR was consulted . She underwent left thoracostomy drainage catheter placement on 06/18/22 and thrombolytic instillation on 06/19/2022.  06/21/2022: Patient seen.  Patient would not or could not communicate freely with me.  Pulmonary recommendation noted.  Chest tube is in place.  06/22/2022: Bloody drainage from the chest tube noted yesterday.  Critical care/pulmonary team is managing.  No new complaints today.  Seen alongside her 3 daughters.  Assessment & Plan:   Principal Problem:   Pleural effusion Active Problems:   Essential hypertension   Thrombocytopenia (HCC)   ESRD (end stage renal disease) (HCC)   DMII (diabetes mellitus, type 2) (HCC)   GERD (gastroesophageal reflux disease)   CAP (community acquired pneumonia)   Delirium   Need for management of chest tube   Hemothorax on left   Loculated pleural effusion  Exudative Pleural effusion: Concern for parapneumonic effusion. IR consulted and performed thoracentesis, yielding 1.2 L of serosanguinous fluid, followed by repeat thoracentesis with minimal amount of serosanguinous fluid. Patient transferred to [facility] for pulmonology consult and consideration of chest tube. CT surgery  recommended CT guided chest tube.  Patient underwent  Left thoracostomy drainage catheter by IR on 06/18/2022.   Patient underwent tube thrombolytics to clear the pleural space by  pulmonology on 06/19/21.  Continue to follow pulmonary recommendations.  Completed  course of antibiotic. 06/22/2021: Continue to follow pulmonary recommendations.   Delirium: In the setting of history of multiple vascular injuries and current hospitalization. Likely hospital related delirium.  Improved at this time.  CT head unremarkable.  On Seroquel at nighttime.  Currently at baseline.   06/21/2022: Continue to manage expectantly.   Community-acquired pneumonia: Followed by complicated pleural effusion.  Status post chest tube and thrombolytics instillation.  Patient developed bloody drainage from the chest tube after thrombolysis.  Pulmonary team managed the bleeding following thrombolysis.  Completed  course of antibiotic.  For hemodialysis tomorrow.   GERD: Continue Protonix.   Diabetes mellitus type 2: Continue sliding scale insulin, Accu-Cheks, diabetic diet.  Latest POC glucose of 119.  ESRD (end stage renal disease) ([place]) Continue hemodialysis as per nephrology.  For hemodialysis tomorrow, 06/23/2022.   Thrombocytopenia ([employer]) Improved at this time.   Essential hypertension Continue labetalol.    Chest pain-resolved.  Likely secondary to hypertension.   Severe uncontrolled hypertension-improved at this time.  As needed hydralazine.  Continue labetalol.  DVT prophylaxis: None.  On aspirin now.  Code Status: Full code.  Family Communication:    Disposition Plan:  Status is: Inpatient  Remains inpatient appropriate because: Loculated left pleural effusion on chest tube.  Undergoing thrombolytic placement    Consultants:  Nephrology Pulmonology Cardiothoracic surgery  Procedures:  Left thoracocentesis 06/12/22   Antimicrobials:  Was on vancomycin cefepime and Zosyn.  Completed  course of antibiotic.  Subjective: -No new complaints. No significant history from patient.     Objective: Vitals:   06/21/22 2119 06/22/22 0506 06/22/22 0844 06/22/22 0925  BP: (!)  130/49 109/79 Marland Kitchen)  117/49 (!) 115/55  Pulse: 87 81 83 83  Resp: '15 17 16   '$ Temp: 97.9 F (36.6 C) 98.5 F (36.9 C) 98 F (36.7 C) 97.9 F (36.6 C)  TempSrc:   Oral Oral  SpO2: 100% 100% 96% 100%  Weight:      Height:        Intake/Output Summary (Last 24 hours) at 06/22/2022 1652 Last data filed at 06/22/2022 1300 Gross per 24 hour  Intake 740 ml  Output 850 ml  Net -110 ml    Filed Weights   06/15/22 2238 06/20/22 0816 06/20/22 1226  Weight: 68.8 kg 67.8 kg 67 kg   Body mass index is 23.13 kg/m.  Physical examination: General: Not in any distress.  Awake and alert.  Not much communication from patient.   HEENT:   Patient is pale.   Chest: Decreased air entry.   CVS: S1 &S2 heard.  Abdomen: Soft, nontender, nondistended.  Bowel sounds are heard.   Extremities: No leg edema. CNS: Awake and alert.  Moves all extremities.    Data Reviewed: I have personally reviewed following labs and imaging studies  CBC: Recent Labs  Lab 06/19/22 0348 06/19/22 1530 06/20/22 0846 06/21/22 0736 06/21/22 1701 06/21/22 2334  WBC 8.1 11.5* 11.6* 11.5* 11.4*  --   HGB 9.2* 8.6* 8.5* 8.0* 7.0* 9.1*  HCT 26.4* 25.3* 25.7* 23.9* 21.3* 26.1*  MCV 92.3 93.4 94.1 94.8 95.5  --   PLT 165 163 170 174 182  --     Basic Metabolic Panel: Recent Labs  Lab 06/15/22 1811 06/17/22 0645 06/19/22 0348 06/20/22 0846 06/21/22 0736  NA 134* 134* 135 134* 137  K 4.5 4.6 3.9 4.6 4.4  CL 95* 94* 94* 94* 96*  CO2 '25 27 27 26 29  '$ GLUCOSE 116* 139* 259* 170* 78  BUN 26* '20 16 23 13  '$ CREATININE 6.02* 5.25* 4.22* 5.88* 4.43*  CALCIUM 7.7* 7.9* 7.8* 7.9* 7.8*  MG  --   --  1.7  --  1.6*  PHOS 3.1  --  3.1 4.0  --     GFR: Estimated Creatinine Clearance: 9.5 mL/min (A) (by C-G formula based on SCr of 4.43 mg/dL (H)). Liver Function Tests: Recent Labs  Lab 06/15/22 1811 06/20/22 0846  ALBUMIN 2.1* 2.0*    No results for input(s): "LIPASE", "AMYLASE" in the last 168 hours. No results for  input(s): "AMMONIA" in the last 168 hours. Coagulation Profile: Recent Labs  Lab 06/21/22 1701  INR 1.3*   Cardiac Enzymes: No results for input(s): "CKTOTAL", "CKMB", "CKMBINDEX", "TROPONINI" in the last 168 hours. BNP (last 3 results) No results for input(s): "PROBNP" in the last 8760 hours. HbA1C: No results for input(s): "HGBA1C" in the last 72 hours. CBG: Recent Labs  Lab 06/21/22 2119 06/22/22 0708 06/22/22 0926 06/22/22 1113 06/22/22 1612  GLUCAP 173* 174* 186* 183* 161*    Lipid Profile: No results for input(s): "CHOL", "HDL", "LDLCALC", "TRIG", "CHOLHDL", "LDLDIRECT" in the last 72 hours. Thyroid Function Tests: No results for input(s): "TSH", "T4TOTAL", "FREET4", "T3FREE", "THYROIDAB" in the last 72 hours. Anemia Panel: No results for input(s): "VITAMINB12", "FOLATE", "FERRITIN", "TIBC", "IRON", "RETICCTPCT" in the last 72 hours. Sepsis Labs: No results for input(s): "PROCALCITON", "LATICACIDVEN" in the last 168 hours.  Recent Results (from the past 240 hour(s))  Aerobic/Anaerobic Culture w Gram Stain (surgical/deep wound)     Status: None (Preliminary result)   Collection Time: 06/18/22  1:51 PM   Specimen: Pleural Fluid  Result  Value Ref Range Status   Specimen Description FLUID PLEURAL  Final   Special Requests NONE  Final   Gram Stain NO WBC SEEN NO ORGANISMS SEEN   Final   Culture   Final    NO GROWTH 4 DAYS NO ANAEROBES ISOLATED; CULTURE IN PROGRESS FOR 5 DAYS Performed at [facility], [place] [address] [place], [place] [address]    Report Status PENDING  Incomplete   Radiology Studies: DG CHEST PORT 1 VIEW  Result Date: 06/21/2022 CLINICAL DATA:  Chest tube placement EXAM: PORTABLE CHEST 1 VIEW COMPARISON:  June 20, 2022 FINDINGS: The left chest tube is stable. Opacity and probable loculated effusion on the left are similar in the interval. No pneumothorax. The cardiomediastinal silhouette is unchanged. The right lung remains clear.  IMPRESSION: 1. The left chest tube remains in place. No pneumothorax. 2. The opacity and loculated effusion on the left are similar in the interval. Electronically Signed   By: Dorise Bullion III M.D.   On: 06/21/2022 08:39    Scheduled Meds:  Chlorhexidine Gluconate Cloth  6 each Topical Q0600   Chlorhexidine Gluconate Cloth  6 each Topical Q0600   [START ON 06/23/2022] darbepoetin (ARANESP) injection - DIALYSIS  60 mcg Subcutaneous Q Mon-1800   dextromethorphan-guaiFENesin  1 tablet Oral BID   hydrocerin   Topical BID   insulin aspart  0-15 Units Subcutaneous TID WC   insulin aspart  0-5 Units Subcutaneous QHS   insulin glargine-yfgn  10 Units Subcutaneous Daily   labetalol  200 mg Oral BID   pantoprazole  40 mg Oral Daily   QUEtiapine  12.5 mg Oral QHS   sevelamer carbonate  800 mg Oral BID AC   sodium chloride flush  10 mL Intrapleural Q8H   Continuous Infusions:    LOS: 13 days    Bonnell Public, MD [facility] If 7PM-7AM, please contact night-coverage

## 2022-06-22 NOTE — Progress Notes (Signed)
At bedside to place PIV.  Previous PIV removed by pt and sitting in spit basin on bedside table.  Pt states " She doesn't want any.  I am done for the year."  RUE restricted. Pt restless and disoriented and refusing IV.  No IV meds ordered except prn Zofran, which is also PO alternative.  Anderson Malta RN for night shift notified that IV will be left out until needed.  Secure chat sent to Dr Marthenia Rolling and Juluis Mire RN, no reply.

## 2022-06-22 NOTE — Progress Notes (Signed)
[facility] NEPHROLOGY PROGRESS NOTE  Assessment/ Plan: Dialyzes at [facility] MWF  4 hours  EDW 75.5. HD Bath 2K, Dialyzer unknown, Heparin no. Access AVF right-  has post bleeding a lot.  Calcitriol 1 mcg q tx   # SOB/pleural effusion- markedly improved after HD.  off of supplemental oxygen.  Has L sided loculated pleural effusion, s/p chest tube 06/18/22 with IR antifibrinolytic 1/3 and 1/5 with PCCM.   #ESRD - On schedule MWF, next treatment 1/8.   # HCAP- on vanc/ cefepime.  #HTN / volume: BP improved on labetalol.  Volume management with HD.  # Anemia- esa started 12/31, transfuse prn.  #Delirium/agitation- thought to be secondary to cefepime which ahs been d/c'd. CTH without acute changes. Per primary.,  Seems improved  Subjective:  Feels puny, stomach upset Received blood yesterday, possibly from losses from chest tube  Objective Vital signs in last 24 hours: Vitals:   06/21/22 2119 06/22/22 0506 06/22/22 0844 06/22/22 0925  BP: (!) 130/49 109/79 (!) 117/49 (!) 115/55  Pulse: 87 81 83 83  Resp: '15 17 16   '$ Temp: 97.9 F (36.6 C) 98.5 F (36.9 C) 98 F (36.7 C) 97.9 F (36.6 C)  TempSrc:   Oral Oral  SpO2: 100% 100% 96% 100%  Weight:      Height:       Weight change:   Intake/Output Summary (Last 24 hours) at 06/22/2022 1002 Last data filed at 06/22/2022 0717 Gross per 24 hour  Intake 530 ml  Output 1230 ml  Net -700 ml        Labs: RENAL PANEL Recent Labs    05/01/22 2235 05/09/22 0659 06/02/22 0740 06/03/22 0329 06/04/22 0344 06/09/22 2000 06/10/22 0353 06/11/22 0345 06/12/22 0327 06/13/22 0157 06/14/22 0414 06/15/22 0331 06/15/22 1811 06/17/22 0645 06/19/22 0348 06/20/22 0846 06/21/22 0736  NA 140   < > 137   < > 136 139 139 137  --   --  136  --  134* 134* 135 134* 137  K 3.7   < > 4.3   < > 3.2* 3.5 3.9 3.7  --   --  4.1  --  4.5 4.6 3.9 4.6 4.4  CL 100   < > 96*   < > 96* 99 101 97*  --   --  95*  --  95* 94* 94*  94* 96*  CO2 26   < > 28   < > '28 28 25 28  '$ --   --  26  --  '25 27 27 26 29  '$ GLUCOSE 275*   < > 220*   < > 146* 187* 184* 210*  --   --  205*  --  116* 139* 259* 170* 78  BUN 27*   < > 41*   < > 34* 28* 31* 17  --   --  32*  --  26* '20 16 23 13  '$ CREATININE 7.01*   < > 8.11*   < > 6.42* 7.43* 7.73* 4.54*  --   --  6.96*  --  6.02* 5.25* 4.22* 5.88* 4.43*  CALCIUM 8.1*   < > 8.0*   < > 7.9* 8.3* 7.9* 7.7*  --   --  7.7*  --  7.7* 7.9* 7.8* 7.9* 7.8*  MG  --   --  2.2  --  1.9  --  2.1 1.7 1.7 1.6* 1.8 1.8  --   --  1.7  --  1.6*  PHOS  --   --  4.6  --   --   --   --  2.2*  --   --   --   --  3.1  --  3.1 4.0  --   ALBUMIN 3.3*  --  3.0*  --   --  3.1* 2.8* 2.5*  --   --   --   --  2.1*  --   --  2.0*  --    < > = values in this interval not displayed.      Liver Function Tests: Recent Labs  Lab 06/15/22 1811 06/20/22 0846  ALBUMIN 2.1* 2.0*    No results for input(s): "LIPASE", "AMYLASE" in the last 168 hours. No results for input(s): "AMMONIA" in the last 168 hours. CBC: Recent Labs    06/15/22 0331 06/15/22 0719 06/17/22 0541 06/19/22 1530 06/20/22 0846 06/21/22 0736 06/21/22 1701 06/21/22 2334  HGB 8.6*  --    < > 8.6* 8.5* 8.0* 7.0* 9.1*  MCV 93.1  --    < > 93.4 94.1 94.8 95.5  --   FERRITIN 1,418*  --   --   --   --   --   --   --   TIBC  --  136*  --   --   --   --   --   --   IRON  --  31  --   --   --   --   --   --    < > = values in this interval not displayed.     Cardiac Enzymes: No results for input(s): "CKTOTAL", "CKMB", "CKMBINDEX", "TROPONINI" in the last 168 hours. CBG: Recent Labs  Lab 06/21/22 1113 06/21/22 1622 06/21/22 2119 06/22/22 0708 06/22/22 0926  GLUCAP 104* 133* 173* 174* 186*     Iron Studies:  No results for input(s): "IRON", "TIBC", "TRANSFERRIN", "FERRITIN" in the last 72 hours.  Studies/Results: DG CHEST PORT 1 VIEW  Result Date: 06/21/2022 CLINICAL DATA:  Chest tube placement EXAM: PORTABLE CHEST 1 VIEW COMPARISON:   June 20, 2022 FINDINGS: The left chest tube is stable. Opacity and probable loculated effusion on the left are similar in the interval. No pneumothorax. The cardiomediastinal silhouette is unchanged. The right lung remains clear. IMPRESSION: 1. The left chest tube remains in place. No pneumothorax. 2. The opacity and loculated effusion on the left are similar in the interval. Electronically Signed   By: Dorise Bullion III M.D.   On: 06/21/2022 08:39    Medications: Infusions:    Scheduled Medications:  aspirin  650 mg Oral Daily   Chlorhexidine Gluconate Cloth  6 each Topical Q0600   Chlorhexidine Gluconate Cloth  6 each Topical Q0600   [START ON 06/23/2022] darbepoetin (ARANESP) injection - DIALYSIS  60 mcg Subcutaneous Q Mon-1800   dextromethorphan-guaiFENesin  1 tablet Oral BID   hydrocerin   Topical BID   insulin aspart  0-15 Units Subcutaneous TID WC   insulin aspart  0-5 Units Subcutaneous QHS   insulin glargine-yfgn  10 Units Subcutaneous Daily   labetalol  200 mg Oral BID   pantoprazole  40 mg Oral Daily   QUEtiapine  12.5 mg Oral QHS   sevelamer carbonate  800 mg Oral BID AC   sodium chloride flush  10 mL Intrapleural Q8H    have reviewed scheduled and prn medications.  Physical Exam: General:NAD, comfortable Heart:RRR, s1s2 nl Lungs:clear b/l, no crackle Abdomen:soft, Non-tender, non-distended Extremities:No edema  Left thoracostomy tube in place Dialysis Access: Right upper extremity AV fistula has thrill and bruit present  [name] 06/22/2022,10:02 AM  LOS: 13 days

## 2022-06-22 NOTE — Progress Notes (Addendum)
NAME:  [name], MRN:  [id number], DOB:  05/05/1941, LOS: 20 ADMISSION DATE:  06/09/2022, CONSULTATION DATE:  06/17/17/2023 REFERRING MD:  Shawna Clamp, MD, CHIEF COMPLAINT:  left parapneumonic effusion   History of Present Illness:  82 year old female with a extensive past medical history is well-documented below is most significant for end-stage renal disease, history of stroke dyspnea left-sided pleural effusion that was drained on 06/10/2022 100 cc but subsequent follow-up radiographic data reveals enlargement of the effusion.  There was concern that this may be a clot.  She has been transferred to [facility] for further evaluation and treatment. Pulmonary critical care is recommended interventional radiology be consulted for any further thoracic interventions.  Underwent left thoracostomy with IR on 06/18/2022 and 12 fr pleural catheter placed.  Pertinent  Medical History  ESRD on HD, T2DM, CVA with residual R sided weakness, HTN, GERD, history of upper GI bleed with gastric banding, duodenal neuroendocrine tumor   Significant Hospital Events: Including procedures, antibiotic start and stop dates in addition to other pertinent events   06/18/2021 12 Fr pleural drain placed by IR 1/5 intrapleural lytics  Interim History / Subjective:  Chest tube output slowed after TXA and DDAVP last night.  Feeling better today.  Objective   Blood pressure (!) 153/66, pulse 85, temperature 98.4 F (36.9 C), temperature source Oral, resp. rate 18, height '5\' 7"'$  (1.702 m), weight 67 kg, SpO2 100 %.        Intake/Output Summary (Last 24 hours) at 06/22/2022 1808 Last data filed at 06/22/2022 1723 Gross per 24 hour  Intake 610 ml  Output 500 ml  Net 110 ml    Filed Weights   06/15/22 2238 06/20/22 0816 06/20/22 1226  Weight: 68.8 kg 67.8 kg 67 kg    Examination: General: Chronically ill-appearing woman lying in bed no acute distress, more awake and alert today HENT: [place]/AT, eyes  anicteric PULM: Breathing comfortably on room air, reduced left basilar breath sounds.  Dark bloody fluid still draining, but much less compared to yesterday - 500 cc in the atrium total. CV: S1-S2, regular rate and rhythm GI: Soft, nontender Extremities: Minus edema, fistula in right upper extremity Neuro: More awake alert, answering questions appropriately, normal speech  Chest tube output: 100cc overnight, about 400 cc today  WBC 11.4 H/H 9.1/26.1   CXR personally reviewed> worsening opacity in left lung, pleural effusion unchanged.   pleural fluid cultures: NGTD  Resolved Hospital Problem list     Assessment & Plan:  Recurrent left exudative pleural effusion> now hemothorax after intrapleural lytics Suspect due to mutifocal pneumonia in mid December. Remains afebrile, leukocytosis resolved. On room air. Cultures negative, MRSA swab negative. IR placed chest tube on 06/18/2022.  -No additional TPA or dornase due to development of hemothorax -Continue chest tube to suction -AM CXR and CBC - Continue to monitor chest tube output - If chest tube output continues to slow can resume DVT prophylaxis tomorrow - Continue antibiotics -Chest CT with contrast tonight, previously discussed with Nephrology- Dr. Joelyn Oms before getting HD tomorrow  ABLA on chronic anemia; improved post-transfusion -AM CBC  Itching -Atarax PRN  ESRD -HD on 1/8  Rest of care per primary.   Best Practice (right click and "Reselect all SmartList Selections" daily)   Diet/type: Regular consistency (see orders) DVT prophylaxis: SCD GI prophylaxis: PPI Lines: N/A Foley:  N/A Code Status:  full code  Labs   CBC: Recent Labs  Lab 06/19/22 0348 06/19/22 1530 06/20/22 1962  06/21/22 0736 06/21/22 1701 06/21/22 2334  WBC 8.1 11.5* 11.6* 11.5* 11.4*  --   HGB 9.2* 8.6* 8.5* 8.0* 7.0* 9.1*  HCT 26.4* 25.3* 25.7* 23.9* 21.3* 26.1*  MCV 92.3 93.4 94.1 94.8 95.5  --   PLT 165 163 170 174 182  --       Basic Metabolic Panel: Recent Labs  Lab 06/15/22 1811 06/17/22 0645 06/19/22 0348 06/20/22 0846 06/21/22 0736  NA 134* 134* 135 134* 137  K 4.5 4.6 3.9 4.6 4.4  CL 95* 94* 94* 94* 96*  CO2 '25 27 27 26 29  '$ GLUCOSE 116* 139* 259* 170* 78  BUN 26* '20 16 23 13  '$ CREATININE 6.02* 5.25* 4.22* 5.88* 4.43*  CALCIUM 7.7* 7.9* 7.8* 7.9* 7.8*  MG  --   --  1.7  --  1.6*  PHOS 3.1  --  3.1 4.0  --     GFR: Estimated Creatinine Clearance: 9.5 mL/min (A) (by C-G formula based on SCr of 4.43 mg/dL (H)). Recent Labs  Lab 06/19/22 1530 06/20/22 0846 06/21/22 0736 06/21/22 1701  WBC 11.5* 11.6* 11.5* 11.4*   Julian Hy, DO 06/22/22 6:08 PM [facility]  For contact information, see Amion. If no response to pager, please call PCCM consult pager. After hours, 7PM- 7AM, please call Elink.

## 2022-06-23 ENCOUNTER — Inpatient Hospital Stay (HOSPITAL_COMMUNITY): Payer: 59

## 2022-06-23 DIAGNOSIS — J942 Hemothorax: Secondary | ICD-10-CM | POA: Diagnosis not present

## 2022-06-23 DIAGNOSIS — Z4682 Encounter for fitting and adjustment of non-vascular catheter: Secondary | ICD-10-CM | POA: Diagnosis not present

## 2022-06-23 DIAGNOSIS — J90 Pleural effusion, not elsewhere classified: Secondary | ICD-10-CM | POA: Diagnosis not present

## 2022-06-23 LAB — CBC
HCT: 23.9 % — ABNORMAL LOW (ref 36.0–46.0)
Hemoglobin: 8.4 g/dL — ABNORMAL LOW (ref 12.0–15.0)
MCH: 31.7 pg (ref 26.0–34.0)
MCHC: 35.1 g/dL (ref 30.0–36.0)
MCV: 90.2 fL (ref 80.0–100.0)
Platelets: 187 10*3/uL (ref 150–400)
RBC: 2.65 MIL/uL — ABNORMAL LOW (ref 3.87–5.11)
RDW: 17.2 % — ABNORMAL HIGH (ref 11.5–15.5)
WBC: 11.8 10*3/uL — ABNORMAL HIGH (ref 4.0–10.5)
nRBC: 0 % (ref 0.0–0.2)

## 2022-06-23 LAB — AEROBIC/ANAEROBIC CULTURE W GRAM STAIN (SURGICAL/DEEP WOUND)
Culture: NO GROWTH
Gram Stain: NONE SEEN

## 2022-06-23 LAB — GLUCOSE, CAPILLARY
Glucose-Capillary: 158 mg/dL — ABNORMAL HIGH (ref 70–99)
Glucose-Capillary: 109 mg/dL — ABNORMAL HIGH (ref 70–99)
Glucose-Capillary: 183 mg/dL — ABNORMAL HIGH (ref 70–99)

## 2022-06-23 MED ORDER — IOHEXOL 350 MG/ML SOLN
75.0000 mL | Freq: Once | INTRAVENOUS | Status: AC | PRN
  Administered 2022-06-23: 75 mL via INTRAVENOUS

## 2022-06-23 NOTE — Progress Notes (Signed)
Patient ID: [name], female   DOB: 04/29/1941, 82 y.o.   MRN: [id number] [facility] Progress Note   Assessment/ Plan:   1.  Acute hypoxic respiratory failure: Secondary to loculated left-sided pleural effusion status post pleural drain on 06/18/2022 allowed by thrombolytic therapy on 1/5.  Suspected to have hemothorax thereafter associated with intrapleural thrombolytics. 2. ESRD: Continue hemodialysis on Monday/Wednesday/Friday schedule with next treatment due today. 3. Anemia: Secondary to losses associated with hemothorax as well as underlying chronic kidney disease/ESRD.  Continue ESA. 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 5. Nutrition: Continue current renal diet with oral nutritional/protein supplementation. 6. Hypertension: Blood pressures "soft" with ongoing labetalol.  Subjective:   Reports to be feeling better with improvement of shortness of breath.   Objective:   BP 97/85 (BP Location: Left Arm)   Pulse 86   Temp 97.9 F (36.6 C) (Oral)   Resp 15   Ht '5\' 7"'$  (1.702 m)   Wt 67 kg   SpO2 100%   BMI 23.13 kg/m   Physical Exam: Gen: Comfortably resting in bed, off of oxygen supplementation CVS: Pulse regular rhythm, normal rate, S1 and S2 normal Resp: Diminished sounds left base otherwise clear to auscultation without rales/rhonchi Abd: Soft, flat, nontender Ext: No lower extremity edema.  Right upper arm AV fistula is slightly pulsatile  Labs: BMET Recent Labs  Lab 06/17/22 0645 06/19/22 0348 06/20/22 0846 06/21/22 0736  NA 134* 135 134* 137  K 4.6 3.9 4.6 4.4  CL 94* 94* 94* 96*  CO2 '27 27 26 29  '$ GLUCOSE 139* 259* 170* 78  BUN '20 16 23 13  '$ CREATININE 5.25* 4.22* 5.88* 4.43*  CALCIUM 7.9* 7.8* 7.9* 7.8*  PHOS  --  3.1 4.0  --    CBC Recent Labs  Lab 06/20/22 0846 06/21/22 0736 06/21/22 1701 06/21/22 2334 06/23/22 0240  WBC 11.6* 11.5* 11.4*  --  11.8*  HGB 8.5* 8.0*  7.0* 9.1* 8.4*  HCT 25.7* 23.9* 21.3* 26.1* 23.9*  MCV 94.1 94.8 95.5  --  90.2  PLT 170 174 182  --  187      Medications:     Chlorhexidine Gluconate Cloth  6 each Topical Q0600   Chlorhexidine Gluconate Cloth  6 each Topical Q0600   darbepoetin (ARANESP) injection - DIALYSIS  60 mcg Subcutaneous Q Mon-1800   dextromethorphan-guaiFENesin  1 tablet Oral BID   hydrocerin   Topical BID   insulin aspart  0-15 Units Subcutaneous TID WC   insulin aspart  0-5 Units Subcutaneous QHS   insulin glargine-yfgn  10 Units Subcutaneous Daily   labetalol  200 mg Oral BID   pantoprazole  40 mg Oral Daily   QUEtiapine  12.5 mg Oral QHS   sevelamer carbonate  800 mg Oral BID AC   sodium chloride flush  10 mL Intrapleural Q8H   Elmarie Shiley, MD 06/23/2022, 10:32 AM

## 2022-06-23 NOTE — Progress Notes (Signed)
PROGRESS NOTE [name]  GHW:[id number] DOB: 05/27/1941 DOA: 06/09/2022 PCP: [name], MD   Brief Narrative/Hospital Course:  82 y.o. female with a history of ESRD, diabetes mellitus type 2, GERD, hypertension, stroke-. Patient presented secondary to shortness of breath and found to have recurrent pneumonia with associated left sided pleural effusion. Empiric antibiotics initiated and patient transferred to [facility] for consideration of chest tube placement. During admission, patient developed hospital delirium.  Patient was seen by nephrology continue on hemodialysis.  IR consulted underwent left 12 French pleural drain placed by IR 1/3, followed by PCCM, s/p thrombolytic instillation on 1/4. Patient continued chest tube in place.  1/7 bloody drainage noted from chest tube     Subjective: Seen and examined She feels well, on RA No chest pain Overnight patient has been afebrile BP 110s to 150s Labs reviewed hemoglobin 8.4 from 9.1 WBC 9.8   Assessment and Plan: Principal Problem:   Pleural effusion Active Problems:   Essential hypertension   Thrombocytopenia (HCC)   ESRD (end stage renal disease) (HCC)   DMII (diabetes mellitus, type 2) (HCC)   GERD (gastroesophageal reflux disease)   CAP (community acquired pneumonia)   Delirium   Need for management of chest tube   Hemothorax on left   Loculated pleural effusion   Acute blood loss anemia   Hemothorax   Recurrent left exudative pleural effusion Now left hemothorax after intrapleural Lytics: Effusion from multifocal pneumonia in mid December.  Overall afebrile leukocytosis resolved, on room air.  Cultures negative MRSA swab was negative.  Chest tube placed by IR 1/3, thrombolytics instilled 1/5.  Continue plan of care as per pulmonary with a.m. chest x-ray, CBC monitor chest tube output.  Patient completed antibiotics.  CT chest early morning 1/8 showed a small loculated pleural effusion on the left with  chest tube in place with decrease in size from prior exam decreased airspace opacities in the left lung.  Number tPA/dornase due to hemothorax continuous chest tube to suction once output less than 100 cc in 24 hours then plan is to remove the chest tube-discussed with PCCM team.  Acute blood loss anemia in the setting of chronic anemia: Improved after transfusion.  Monitor H&H  Delirium:In the setting of history of multiple vascular injuries and current hospitalization. Likely hospital related delirium.  Improved, CT head unremarkable continue Seroquel at nighttime, continue fall precautions supportive care delirium precaution  GERD continue PPI  Type 2 diabetes mellitus blood sugar is stable continue semaglutide units daily, SSI   ESRD on HD MWF schedule followed by nephrology SDD 1/8.   Thrombocytopenia: Improved.  Monitor Essential hypertension BP stable continue labetalol Chest pain resolved likely due to hypertension  DVT prophylaxis: Place and maintain sequential compression device Start: 06/14/22 0721 SCDs Start: 06/09/22 2218 Code Status:   Code Status: Full Code Family Communication: plan of care discussed with patient at bedside. Patient status is: inpatient  because of chest tube Level of care: Telemetry Medical   Dispo: The patient is from: home, lives with her son            Anticipated disposition: hOME W/ hh once CHEST TUBE REMOVED.  Discussed with pulmonary team this morning  Objective: Vitals last 24 hrs: Vitals:   06/22/22 0844 06/22/22 0925 06/22/22 1701 06/23/22 0841  BP: (!) 117/49 (!) 115/55 (!) 153/66 97/85  Pulse: 83 83 85 86  Resp: '16  18 15  '$ Temp: 98 F (36.7 C) 97.9 F (36.6 C) 98.4  F (36.9 C) 97.9 F (36.6 C)  TempSrc: Oral Oral Oral Oral  SpO2: 96% 100% 100% 100%  Weight:      Height:       Weight change:   Physical Examination: General exam: alert awake, older than stated age HEENT:Oral mucosa moist, Ear/Nose WNL grossly Respiratory  system: bilaterally clear BS, left chest w/ chest tube,  no use of accessory muscle Cardiovascular system: S1 & S2 +, No JVD. Gastrointestinal system: Abdomen soft,NT,ND, BS+ Nervous System:Alert, awake, moving extremities. Extremities: RUE AV fistula w/ w/ thrill Skin: No rashes,no icterus. MSK: Normal muscle bulk,tone, power  Medications reviewed:  Scheduled Meds:  Chlorhexidine Gluconate Cloth  6 each Topical Q0600   Chlorhexidine Gluconate Cloth  6 each Topical Q0600   darbepoetin (ARANESP) injection - DIALYSIS  60 mcg Subcutaneous Q Mon-1800   dextromethorphan-guaiFENesin  1 tablet Oral BID   hydrocerin   Topical BID   insulin aspart  0-15 Units Subcutaneous TID WC   insulin aspart  0-5 Units Subcutaneous QHS   insulin glargine-yfgn  10 Units Subcutaneous Daily   labetalol  200 mg Oral BID   pantoprazole  40 mg Oral Daily   QUEtiapine  12.5 mg Oral QHS   sevelamer carbonate  800 mg Oral BID AC   sodium chloride flush  10 mL Intrapleural Q8H   Continuous Infusions:    Diet Order             Diet Heart Room service appropriate? Yes; Fluid consistency: Thin  Diet effective now                   Intake/Output Summary (Last 24 hours) at 06/23/2022 0920 Last data filed at 06/23/2022 0804 Gross per 24 hour  Intake 480 ml  Output 275 ml  Net 205 ml   Net IO Since Admission: -8,887.4 mL [06/23/22 0920]  Wt Readings from Last 3 Encounters:  06/20/22 67 kg  06/04/22 73.8 kg  05/09/22 72.9 kg     Unresulted Labs (From admission, onward)    None     Data Reviewed: I have personally reviewed following labs and imaging studies CBC: Recent Labs  Lab 06/19/22 1530 06/20/22 0846 06/21/22 0736 06/21/22 1701 06/21/22 2334 06/23/22 0240  WBC 11.5* 11.6* 11.5* 11.4*  --  11.8*  HGB 8.6* 8.5* 8.0* 7.0* 9.1* 8.4*  HCT 25.3* 25.7* 23.9* 21.3* 26.1* 23.9*  MCV 93.4 94.1 94.8 95.5  --  90.2  PLT 163 170 174 182  --  397   Basic Metabolic Panel: Recent Labs  Lab  06/17/22 0645 06/19/22 0348 06/20/22 0846 06/21/22 0736  NA 134* 135 134* 137  K 4.6 3.9 4.6 4.4  CL 94* 94* 94* 96*  CO2 '27 27 26 29  '$ GLUCOSE 139* 259* 170* 78  BUN '20 16 23 13  '$ CREATININE 5.25* 4.22* 5.88* 4.43*  CALCIUM 7.9* 7.8* 7.9* 7.8*  MG  --  1.7  --  1.6*  PHOS  --  3.1 4.0  --    GFR: Estimated Creatinine Clearance: 9.5 mL/min (A) (by C-G formula based on SCr of 4.43 mg/dL (H)). Liver Function Tests: Recent Labs  Lab 06/20/22 0846  ALBUMIN 2.0*  Coagulation Profile: Recent Labs  Lab 06/21/22 1701  INR 1.3*  CBG: Recent Labs  Lab 06/22/22 0708 06/22/22 0926 06/22/22 1113 06/22/22 1612 06/23/22 0726  GLUCAP 174* 186* 183* 161* 158*   Recent Results (from the past 240 hour(s))  Aerobic/Anaerobic Culture w Gram Stain (surgical/deep wound)  Status: None (Preliminary result)   Collection Time: 06/18/22  1:51 PM   Specimen: Pleural Fluid  Result Value Ref Range Status   Specimen Description FLUID PLEURAL  Final   Special Requests NONE  Final   Gram Stain NO WBC SEEN NO ORGANISMS SEEN   Final   Culture   Final    NO GROWTH 4 DAYS NO ANAEROBES ISOLATED; CULTURE IN PROGRESS FOR 5 DAYS Performed at [facility], [address], [place],  [address]    Report Status PENDING  Incomplete    Antimicrobials: Anti-infectives (From admission, onward)    Start     Dose/Rate Route Frequency Ordered Stop   06/18/22 1200  vancomycin (VANCOREADY) IVPB 750 mg/150 mL  Status:  Discontinued        750 mg 150 mL/hr over 60 Minutes Intravenous Every M-W-F (Hemodialysis) 06/14/22 1156 06/19/22 1049   06/15/22 1800  vancomycin (VANCOREADY) IVPB 750 mg/150 mL        750 mg 150 mL/hr over 60 Minutes Intravenous Once in dialysis 06/15/22 1151 06/16/22 0130   06/14/22 2200  piperacillin-tazobactam (ZOSYN) IVPB 2.25 g  Status:  Discontinued        2.25 g 100 mL/hr over 30 Minutes Intravenous Every 8 hours 06/14/22 1806 06/19/22 1049   06/14/22 1800  ceFEPIme  (MAXIPIME) 1 g in sodium chloride 0.9 % 100 mL IVPB  Status:  Discontinued        1 g 200 mL/hr over 30 Minutes Intravenous Every 24 hours 06/14/22 1136 06/14/22 1803   06/14/22 1230  vancomycin (VANCOREADY) IVPB 750 mg/150 mL        750 mg 150 mL/hr over 60 Minutes Intravenous  Once 06/14/22 1142 06/14/22 1901   06/12/22 1230  vancomycin (VANCOREADY) IVPB 750 mg/150 mL        750 mg 150 mL/hr over 60 Minutes Intravenous  Once 06/12/22 1142 06/12/22 1351   06/11/22 2200  vancomycin (VANCOREADY) IVPB 750 mg/150 mL  Status:  Discontinued        750 mg 150 mL/hr over 60 Minutes Intravenous Every M-W-F (Hemodialysis) 06/11/22 1559 06/13/22 2043   06/11/22 1600  ceFEPIme (MAXIPIME) 2 g in sodium chloride 0.9 % 100 mL IVPB  Status:  Discontinued        2 g 200 mL/hr over 30 Minutes Intravenous Every M-W-F (Hemodialysis) 06/11/22 1312 06/14/22 1136   06/10/22 2300  ceFEPIme (MAXIPIME) 1 g in sodium chloride 0.9 % 100 mL IVPB  Status:  Discontinued        1 g 200 mL/hr over 30 Minutes Intravenous Every 24 hours 06/09/22 2219 06/11/22 1312   06/10/22 1800  vancomycin (VANCOREADY) IVPB 750 mg/150 mL        750 mg 150 mL/hr over 60 Minutes Intravenous Every T-Th-Sa (Hemodialysis) 06/10/22 1302 06/10/22 2030   06/09/22 2230  ceFEPIme (MAXIPIME) 2 g in sodium chloride 0.9 % 100 mL IVPB        2 g 200 mL/hr over 30 Minutes Intravenous  Once 06/09/22 2219 06/10/22 0005   06/09/22 2230  vancomycin (VANCOREADY) IVPB 1750 mg/350 mL        1,750 mg 175 mL/hr over 120 Minutes Intravenous  Once 06/09/22 2219 06/10/22 0327   06/09/22 2219  vancomycin variable dose per unstable renal function (pharmacist dosing)  Status:  Discontinued         Does not apply See admin instructions 06/09/22 2219 06/19/22 1049      Culture/Microbiology  Component Value Date/Time   SDES FLUID PLEURAL 06/18/2022 1351   SPECREQUEST NONE 06/18/2022 1351   CULT  06/18/2022 1351    NO GROWTH 4 DAYS NO ANAEROBES ISOLATED;  CULTURE IN PROGRESS FOR 5 DAYS Performed at [facility], [place] [address], [place], [place] [address]    REPTSTATUS PENDING 06/18/2022 1351  Radiology Studies: CT CHEST W CONTRAST  Result Date: 06/23/2022 CLINICAL DATA:  Pneumonia, complication suspected. EXAM: CT CHEST WITH CONTRAST TECHNIQUE: Multidetector CT imaging of the chest was performed during intravenous contrast administration. RADIATION DOSE REDUCTION: This exam was performed according to the departmental dose-optimization program which includes automated exposure control, adjustment of the mA and/or kV according to patient size and/or use of iterative reconstruction technique. CONTRAST:  51m OMNIPAQUE IOHEXOL 350 MG/ML SOLN COMPARISON:  06/13/2022. FINDINGS: Cardiovascular: The heart is enlarged and there is a small pericardial effusion. Three-vessel coronary artery calcifications are noted. There is atherosclerotic calcification of the aorta without evidence of aneurysm. The pulmonary trunk is distended suggesting underlying pulmonary artery hypertension. Mediastinum/Nodes: No mediastinal, hilar, or axillary lymphadenopathy by size criteria. The thyroid gland, trachea, and esophagus are within normal limits. Lungs/Pleura: There is a small to moderate loculated pleural effusion on the left, decreased from the prior exam. A chest tube terminates in the collection in the left lower lobe. Bronchial wall thickening is noted in the left lower lobe. Strandy atelectasis or infiltrate in the left upper and lower lobes, decreased from the prior exam. Dependent atelectasis is noted on the right. No pneumothorax. Upper Abdomen: Scattered diverticula are present along the colon. There is bilateral renal atrophy with cortical thinning and possible cysts. No acute abnormality. Musculoskeletal: Degenerative changes are present in the thoracic spine. No acute or suspicious osseous abnormality. A vascular stent is noted in the right upper extremity.  IMPRESSION: 1. Small loculated pleural effusion on the left with a chest tube in place and decreased in size from the prior exam. 2. Decreased airspace opacities in the left lung. 3. Coronary artery calcifications and aortic atherosclerosis. Electronically Signed   By: LBrett FairyM.D.   On: 06/23/2022 01:53     LOS: 14 days   RAntonieta Pert MD [facility]  06/23/2022, 9:20 AM

## 2022-06-23 NOTE — Progress Notes (Signed)
Chest tube removed, occlusive dressing applied. RN x2  at bedside to assist. No follow up imaging needed unless change in respiratory symptoms.  Julian Hy, DO 06/23/22 2:19 PM  Pulmonary & Critical Care

## 2022-06-23 NOTE — Progress Notes (Signed)
Mobility Specialist Progress Note:    06/23/22 1522  Mobility  Activity Transferred from chair to bed  Level of Assistance Minimal assist, patient does 75% or more  Assistive Device Front wheel walker  Distance Ambulated (ft) 3 ft  Activity Response Tolerated well  Mobility Referral Yes  $Mobility charge 1 Mobility   Pt requested to transfer back to bed. Tolerated well. Left pt in bed with all needs met.   Royetta Crochet Mobility Specialist Please contact via Solicitor or  Rehab office at [phone]

## 2022-06-23 NOTE — Progress Notes (Signed)
Received patient in bed to unit.  Alert and oriented.  Informed consent signed and in chart.   Treatment initiated: 1955 Treatment completed: 2331  Patient tolerated well.  Transported back to the room  Alert, without acute distress.  Hand-off given to patient's nurse.   Access used: fistula Access issues: none  Total UF removed: 1500 ( goal was 2000, pts BP dropped rapidly from the 150's to the low 100's so I lowered her goal and gave her a 100cc bolus to help raise her BP). Medication(s) given: none Post HD VS: 98.3, 115/72(82), HR-89, RR-16, SP02-100 Post HD weight: 65.2kg   [facility]

## 2022-06-23 NOTE — Progress Notes (Addendum)
Physical Therapy Treatment Patient Details Name: [name] MRN: [id number] DOB: 01/10/1941 Today's Date: 06/23/2022   History of Present Illness 82 y.o. female admitted on 06/07/2022 with left-sided pleural effusion, pneumonia and dyspnea. PMHx: ESRD, diabetes mellitus type 2, GERD, hypertension, history of stroke.    PT Comments    Continuing work on functional mobility and activity tolerance;  Session focused on progressive amb, and Ms. Economos was much more able to participate today; walked in the hallway with chair push for safety; session conducted on room air, and O2 sat readings (that we could trust with a good pleth wave) stayed at or near 100%; Fatigued towards the end of the walk, and getting notably shakier, though no overt knee buckling; Pt also showed care with moving and managing chest tube line; Hopeful for continuing functional progress  Recommendations for follow up therapy are one component of a multi-disciplinary discharge planning process, led by the attending physician.  Recommendations may be updated based on patient status, additional functional criteria and insurance authorization.  Follow Up Recommendations  Home health PT     Assistance Recommended at Discharge Frequent or constant Supervision/Assistance  Patient can return home with the following A lot of help with walking and/or transfers   Equipment Recommendations  Wheelchair (measurements PT);Wheelchair cushion (measurements PT);Rolling walker (2 wheels);BSC/3in1 (may not need wheelchair)    Recommendations for Other Services       Precautions / Restrictions Precautions Precautions: Fall     Mobility  Bed Mobility Overal bed mobility: Needs Assistance Bed Mobility: Supine to Sit     Supine to sit: Min assist     General bed mobility comments: Min assist to sit up; assist for chest tube line managemetn    Transfers Overall transfer level: Needs assistance Equipment used: Rolling  walker (2 wheels) Transfers: Sit to/from Stand Sit to Stand: Min assist           General transfer comment: Cues for hand placement    Ambulation/Gait Ambulation/Gait assistance: Min assist, +2 physical assistance, +2 safety/equipment Gait Distance (Feet): 45 Feet Assistive device: Rolling walker (2 wheels) Gait Pattern/deviations: Decreased step length - right, Decreased step length - left, Decreased stride length, Trunk flexed, Step-through pattern Gait velocity: decreased     General Gait Details: able to get out into the hallway with RW and chair follow; fatigued and shake at end of walk; O2 sats at or near 100% with walking on room air   Stairs             Wheelchair Mobility    Modified Rankin (Stroke Patients Only)       Balance     Sitting balance-Leahy Scale: Fair       Standing balance-Leahy Scale: Poor (approaching Fair)                              Cognition Arousal/Alertness: Awake/alert Behavior During Therapy: WFL for tasks assessed/performed Overall Cognitive Status: Impaired/Different from baseline Area of Impairment: Memory, Safety/judgement, Awareness                     Memory: Decreased recall of precautions   Safety/Judgement: Decreased awareness of deficits, Decreased awareness of safety Awareness: Intellectual            Exercises      General Comments General comments (skin integrity, edema, etc.): Shows better awareness of chest tube line and is very careful with it  Pertinent Vitals/Pain Pain Assessment Pain Assessment: Faces Faces Pain Scale: Hurts a little bit Pain Location: Pain at chest tube site Pain Descriptors / Indicators: Sore Pain Intervention(s): Monitored during session    Home Living                          Prior Function            PT Goals (current goals can now be found in the care plan section) Acute Rehab PT Goals Patient Stated Goal: Agrees to walk  before going to HD PT Goal Formulation: With patient/family Time For Goal Achievement: 06/25/22 Potential to Achieve Goals: Fair Progress towards PT goals: Progressing toward goals    Frequency    Min 3X/week      PT Plan Current plan remains appropriate    Co-evaluation              AM-PAC PT "6 Clicks" Mobility   Outcome Measure  Help needed turning from your back to your side while in a flat bed without using bedrails?: A Little Help needed moving from lying on your back to sitting on the side of a flat bed without using bedrails?: A Little Help needed moving to and from a bed to a chair (including a wheelchair)?: A Little Help needed standing up from a chair using your arms (e.g., wheelchair or bedside chair)?: A Little Help needed to walk in hospital room?: Total Help needed climbing 3-5 steps with a railing? : Total 6 Click Score: 14    End of Session Equipment Utilized During Treatment: Gait belt (at axillae) Activity Tolerance: Patient tolerated treatment well Patient left: in chair;with call bell/phone within reach;with chair alarm set;with family/visitor present Nurse Communication: Mobility status PT Visit Diagnosis: Unsteadiness on feet (R26.81);Other abnormalities of gait and mobility (R26.89);Muscle weakness (generalized) (M62.81);Difficulty in walking, not elsewhere classified (R26.2)     Time: 8527-7824 PT Time Calculation (min) (ACUTE ONLY): 36 min  Charges:  $Gait Training: 23-37 mins                     [name], [place] Office [phone]    Colletta Maryland 06/23/2022, 3:54 PM

## 2022-06-23 NOTE — Progress Notes (Signed)
   NAME:  [name], MRN:  [id number], DOB:  March 03, 1941, LOS: 89 ADMISSION DATE:  06/09/2022, CONSULTATION DATE:  06/17/17/2023 REFERRING MD:  Shawna Clamp, MD, CHIEF COMPLAINT:  left parapneumonic effusion   History of Present Illness:  82 year old female with a extensive past medical history is well-documented below is most significant for end-stage renal disease, history of stroke dyspnea left-sided pleural effusion that was drained on 06/10/2022 100 cc but subsequent follow-up radiographic data reveals enlargement of the effusion.  There was concern that this may be a clot.  She has been transferred to [facility] for further evaluation and treatment. Pulmonary critical care is recommended interventional radiology be consulted for any further thoracic interventions.  Underwent left thoracostomy with IR on 06/18/2022 and 12 fr pleural catheter placed.  Pertinent  Medical History  ESRD on HD, T2DM, CVA with residual R sided weakness, HTN, GERD, history of upper GI bleed with gastric banding, duodenal neuroendocrine tumor   Significant Hospital Events: Including procedures, antibiotic start and stop dates in addition to other pertinent events   06/18/2021 12 Fr pleural drain placed by IR 1/5 intrapleural lytics  Interim History / Subjective:  Chest tube output slowed after TXA and DDAVP last night.  Feeling better today.  Objective   Blood pressure (Abnormal) 153/66, pulse 85, temperature 98.4 F (36.9 C), temperature source Oral, resp. rate 18, height '5\' 7"'$  (1.702 m), weight 67 kg, SpO2 100 %.        Intake/Output Summary (Last 24 hours) at 06/23/2022 0835 Last data filed at 06/23/2022 0804 Gross per 24 hour  Intake 480 ml  Output 275 ml  Net 205 ml   Filed Weights   06/15/22 2238 06/20/22 0816 06/20/22 1226  Weight: 68.8 kg 67.8 kg 67 kg    Examination: General resting in bed no distress HENT NCAT no JVD Pulm dec bilateral Chest tube out put: 175 ml  Card rrr  Abd  soft  Ext warm and dry  Neuro awake, oriented   Resolved Hospital Problem list     Assessment & Plan:  Recurrent left exudative pleural effusion  hemothorax after intrapleural lytics ABLA on chronic anemia  Itching ESRD   Pulm problem list Recurrent left exudative pleural effusion> now hemothorax after intrapleural lytics Suspect due to mutifocal pneumonia in mid December.   IR placed chest tube on 06/18/2022.  CT chest reviewed. Has residual small loculated left effusion. Left upper and lower lobe airspace disease. All improved.   Plan No more TPA/dornase d/t hemothorax Cont CT to sxn  Once outpit < 100 cc in 24 hrs dc chest tube Trend CBC  Best Practice (right click and "Reselect all SmartList Selections" daily)   Diet/type: Regular consistency (see orders) DVT prophylaxis: SCD GI prophylaxis: PPI Lines: N/A Foley:  N/A Code Status:  full code

## 2022-06-24 DIAGNOSIS — J90 Pleural effusion, not elsewhere classified: Secondary | ICD-10-CM | POA: Diagnosis not present

## 2022-06-24 LAB — MISC LABCORP TEST (SEND OUT): Labcorp test code: 9985

## 2022-06-24 LAB — GLUCOSE, CAPILLARY
Glucose-Capillary: 108 mg/dL — ABNORMAL HIGH (ref 70–99)
Glucose-Capillary: 114 mg/dL — ABNORMAL HIGH (ref 70–99)
Glucose-Capillary: 129 mg/dL — ABNORMAL HIGH (ref 70–99)
Glucose-Capillary: 148 mg/dL — ABNORMAL HIGH (ref 70–99)
Glucose-Capillary: 149 mg/dL — ABNORMAL HIGH (ref 70–99)
Glucose-Capillary: 182 mg/dL — ABNORMAL HIGH (ref 70–99)
Glucose-Capillary: 47 mg/dL — ABNORMAL LOW (ref 70–99)
Glucose-Capillary: 78 mg/dL (ref 70–99)
Glucose-Capillary: 99 mg/dL (ref 70–99)

## 2022-06-24 NOTE — Progress Notes (Signed)
Pt. At 3/4 of a [place] sandwich, 120 ml of juice and 3/4 cup of apple sauce, blood glucose increased to 78, pt. Remains without symptoms, new order to check blood glucose every 2 hours  Anastasio Auerbach

## 2022-06-24 NOTE — Plan of Care (Signed)
  Problem: Activity: Goal: Ability to tolerate increased activity will improve Outcome: Progressing   Problem: Clinical Measurements: Goal: Ability to maintain a body temperature in the normal range will improve Outcome: Progressing   Problem: Respiratory: Goal: Ability to maintain adequate ventilation will improve Outcome: Progressing Goal: Ability to maintain a clear airway will improve Outcome: Progressing   Problem: Education: Goal: Knowledge of General Education information will improve Description: Including pain rating scale, medication(s)/side effects and non-pharmacologic comfort measures Outcome: Progressing   Problem: Health Behavior/Discharge Planning: Goal: Ability to manage health-related needs will improve Outcome: Progressing   Problem: Clinical Measurements: Goal: Ability to maintain clinical measurements within normal limits will improve Outcome: Progressing Goal: Will remain free from infection Outcome: Progressing Goal: Diagnostic test results will improve Outcome: Progressing Goal: Respiratory complications will improve Outcome: Progressing Goal: Cardiovascular complication will be avoided Outcome: Progressing   Problem: Activity: Goal: Risk for activity intolerance will decrease Outcome: Progressing   Problem: Nutrition: Goal: Adequate nutrition will be maintained Outcome: Progressing   Problem: Coping: Goal: Level of anxiety will decrease Outcome: Progressing   Problem: Elimination: Goal: Will not experience complications related to bowel motility Outcome: Progressing Goal: Will not experience complications related to urinary retention Outcome: Progressing   Problem: Pain Managment: Goal: General experience of comfort will improve Outcome: Progressing   Problem: Safety: Goal: Ability to remain free from injury will improve Outcome: Progressing   Problem: Skin Integrity: Goal: Risk for impaired skin integrity will decrease Outcome:  Progressing   Problem: Education: Goal: Ability to describe self-care measures that may prevent or decrease complications (Diabetes Survival Skills Education) will improve Outcome: Progressing Goal: Individualized Educational Video(s) Outcome: Progressing   Problem: Coping: Goal: Ability to adjust to condition or change in health will improve Outcome: Progressing   Problem: Fluid Volume: Goal: Ability to maintain a balanced intake and output will improve Outcome: Progressing   Problem: Health Behavior/Discharge Planning: Goal: Ability to identify and utilize available resources and services will improve Outcome: Progressing Goal: Ability to manage health-related needs will improve Outcome: Progressing   Problem: Metabolic: Goal: Ability to maintain appropriate glucose levels will improve Outcome: Progressing   Problem: Nutritional: Goal: Maintenance of adequate nutrition will improve Outcome: Progressing Goal: Progress toward achieving an optimal weight will improve Outcome: Progressing   Problem: Skin Integrity: Goal: Risk for impaired skin integrity will decrease Outcome: Progressing   Problem: Tissue Perfusion: Goal: Adequacy of tissue perfusion will improve Outcome: Progressing   

## 2022-06-24 NOTE — Progress Notes (Addendum)
PROGRESS NOTE [name]  RCV:[id number] DOB: Oct 20, 1940 DOA: 06/09/2022 PCP: [name], MD   Brief Narrative/Hospital Course:  82 y.o. female with a history of ESRD, diabetes mellitus type 2, GERD, hypertension, stroke-. Patient presented secondary to shortness of breath and found to have recurrent pneumonia with associated left sided pleural effusion. Empiric antibiotics initiated and patient transferred to [facility] for consideration of chest tube placement. During admission, patient developed hospital delirium.  Patient was seen by nephrology continue on hemodialysis.  IR consulted underwent left 12 French pleural drain placed by IR 1/3, followed by PCCM, s/p thrombolytic instillation on 1/4. Patient continued chest tube in place.  1/7 bloody drainage noted from chest tube     Subjective: Seen and examined this morning.  Resting comfortably Overnight afebrile on room air  Her chest tube was removed 1/8 PT OT assisted home health PT. Assessment and Plan: Principal Problem:   Pleural effusion Active Problems:   Essential hypertension   Thrombocytopenia (HCC)   ESRD (end stage renal disease) (HCC)   DMII (diabetes mellitus, type 2) (HCC)   GERD (gastroesophageal reflux disease)   CAP (community acquired pneumonia)   Delirium   Need for management of chest tube   Hemothorax on left   Loculated pleural effusion   Acute blood loss anemia   Hemothorax   Recurrent left exudative pleural effusion Now left hemothorax after intrapleural Lytics: Effusion from multifocal pneumonia in mid December.  Overall afebrile leukocytosis resolved, on room air.  Cultures negative MRSA swab was negative.  Chest tube placed by IR 1/3, thrombolytics instilled 1/5.patient completed antibiotics.  CT 1/8 showed a small loculated pleural effusion on the left with chest tube in place with decrease in size from prior exam decreased airspace opacities in the left lung.Chest tube removed 1/8 no  need for follow-up imaging unless he has respiratory issues.  No further plan per pulmonary increase activity  Acute blood loss anemia in the setting of chronic anemia: Improved after transfusion as:  Recent Labs  Lab 06/20/22 0846 06/21/22 0736 06/21/22 1701 06/21/22 2334 06/23/22 0240  HGB 8.5* 8.0* 7.0* 9.1* 8.4*  HCT 25.7* 23.9* 21.3* 26.1* 23.9*    Delirium:In the setting of history of multiple vascular injuries and current hospitalization. Likely hospital related delirium.  Improved, CT head unremarkable continue Seroquel at nighttime, continue fall precautions supportive care delirium precaution.  Some baseline confusion but stable  GERD continue PPI  Type 2 diabetes mellitus blood sugar is stable continue semaglutide, SSI  Patient had  hypoglycemic to 47 1/9> discontinued her Semglee, keep on SSI only. Recent Labs  Lab 06/23/22 0726 06/23/22 1115 06/23/22 1613 06/24/22 0023 06/24/22 0712  GLUCAP 158* 109* 183* 182* 129*    ESRD on HD MWF schedule followed by nephrology  last HD 1/8.   Thrombocytopenia: Improved.  Monitor Essential hypertension BP stable continue labetalol Chest pain resolved likely due to hypertension  Deconditioning debility weakness: Will request PT OT reevaluation , daughter is concerned that she may not able to get her in and out of a car or back into the house, asked social worker to look into whether she qualifies for skilled nursing facility.  DVT prophylaxis: Place and maintain sequential compression device Start: 06/14/22 0721 SCDs Start: 06/09/22 2218 Code Status:   Code Status: Full Code Family Communication: plan of care discussed with patient at bedside. Patient status is: inpatient  because of chest tube Level of care: Telemetry Medical   Dispo: The patient is from:  home, lives with her son            Anticipated disposition: PT OT eval requested anticipate discharge in next 24 hours   Objective: Vitals last 24 hrs: Vitals:    06/23/22 2340 06/23/22 2341 06/24/22 0503 06/24/22 0832  BP: (!) 131/58  (!) 135/59 (!) 129/44  Pulse: 87  90 88  Resp: '14  18 18  '$ Temp:   98.2 F (36.8 C) 98.3 F (36.8 C)  TempSrc:   Oral Oral  SpO2: 99%  99% 100%  Weight:  65.2 kg    Height:       Weight change:   Physical Examination: General exam: AA,pleasantly confused,older appearing HEENT:Oral mucosa moist, Ear/Nose WNL grossly, dentition normal. Respiratory system: bilaterally clear BS, left chest tube in place Cardiovascular system: S1 & S2 +, regular rate. Gastrointestinal system: Abdomen soft, NT,ND,BS+ Nervous System:Alert, awake, moving extremities and grossly nonfocal Extremities: RUE AVF+, lower extremities warm Skin: No rashes,no icterus. MSK: Normal muscle bulk,tone, power   Medications reviewed:  Scheduled Meds:  Chlorhexidine Gluconate Cloth  6 each Topical Q0600   Chlorhexidine Gluconate Cloth  6 each Topical Q0600   darbepoetin (ARANESP) injection - DIALYSIS  60 mcg Subcutaneous Q Mon-1800   dextromethorphan-guaiFENesin  1 tablet Oral BID   hydrocerin   Topical BID   insulin aspart  0-15 Units Subcutaneous TID WC   insulin aspart  0-5 Units Subcutaneous QHS   insulin glargine-yfgn  10 Units Subcutaneous Daily   labetalol  200 mg Oral BID   pantoprazole  40 mg Oral Daily   QUEtiapine  12.5 mg Oral QHS   sevelamer carbonate  800 mg Oral BID AC   sodium chloride flush  10 mL Intrapleural Q8H   Continuous Infusions:    Diet Order             Diet Heart Room service appropriate? Yes; Fluid consistency: Thin  Diet effective now                   Intake/Output Summary (Last 24 hours) at 06/24/2022 0839 Last data filed at 06/24/2022 2025 Gross per 24 hour  Intake 240 ml  Output 1500 ml  Net -1260 ml    Net IO Since Admission: -10,147.4 mL [06/24/22 0839]  Wt Readings from Last 3 Encounters:  06/23/22 65.2 kg  06/04/22 73.8 kg  05/09/22 72.9 kg     Unresulted Labs (From admission, onward)     None     Data Reviewed: I have personally reviewed following labs and imaging studies CBC: Recent Labs  Lab 06/19/22 1530 06/20/22 0846 06/21/22 0736 06/21/22 1701 06/21/22 2334 06/23/22 0240  WBC 11.5* 11.6* 11.5* 11.4*  --  11.8*  HGB 8.6* 8.5* 8.0* 7.0* 9.1* 8.4*  HCT 25.3* 25.7* 23.9* 21.3* 26.1* 23.9*  MCV 93.4 94.1 94.8 95.5  --  90.2  PLT 163 170 174 182  --  427    Basic Metabolic Panel: Recent Labs  Lab 06/19/22 0348 06/20/22 0846 06/21/22 0736  NA 135 134* 137  K 3.9 4.6 4.4  CL 94* 94* 96*  CO2 '27 26 29  '$ GLUCOSE 259* 170* 78  BUN '16 23 13  '$ CREATININE 4.22* 5.88* 4.43*  CALCIUM 7.8* 7.9* 7.8*  MG 1.7  --  1.6*  PHOS 3.1 4.0  --     GFR: Estimated Creatinine Clearance: 9.5 mL/min (A) (by C-G formula based on SCr of 4.43 mg/dL (H)). Liver Function Tests: Recent Labs  Lab 06/20/22  1191  ALBUMIN 2.0*   Coagulation Profile: Recent Labs  Lab 06/21/22 1701  INR 1.3*   CBG: Recent Labs  Lab 06/23/22 0726 06/23/22 1115 06/23/22 1613 06/24/22 0023 06/24/22 0712  GLUCAP 158* 109* 183* 182* 129*    Recent Results (from the past 240 hour(s))  Aerobic/Anaerobic Culture w Gram Stain (surgical/deep wound)     Status: None   Collection Time: 06/18/22  1:51 PM   Specimen: Pleural Fluid  Result Value Ref Range Status   Specimen Description FLUID PLEURAL  Final   Special Requests NONE  Final   Gram Stain NO WBC SEEN NO ORGANISMS SEEN   Final   Culture   Final    No growth aerobically or anaerobically. Performed at [facility], [place] [address], [place], [place] [address]    Report Status 06/23/2022 FINAL  Final    Antimicrobials: Anti-infectives (From admission, onward)    Start     Dose/Rate Route Frequency Ordered Stop   06/18/22 1200  vancomycin (VANCOREADY) IVPB 750 mg/150 mL  Status:  Discontinued        750 mg 150 mL/hr over 60 Minutes Intravenous Every M-W-F (Hemodialysis) 06/14/22 1156 06/19/22 1049   06/15/22 1800   vancomycin (VANCOREADY) IVPB 750 mg/150 mL        750 mg 150 mL/hr over 60 Minutes Intravenous Once in dialysis 06/15/22 1151 06/16/22 0130   06/14/22 2200  piperacillin-tazobactam (ZOSYN) IVPB 2.25 g  Status:  Discontinued        2.25 g 100 mL/hr over 30 Minutes Intravenous Every 8 hours 06/14/22 1806 06/19/22 1049   06/14/22 1800  ceFEPIme (MAXIPIME) 1 g in sodium chloride 0.9 % 100 mL IVPB  Status:  Discontinued        1 g 200 mL/hr over 30 Minutes Intravenous Every 24 hours 06/14/22 1136 06/14/22 1803   06/14/22 1230  vancomycin (VANCOREADY) IVPB 750 mg/150 mL        750 mg 150 mL/hr over 60 Minutes Intravenous  Once 06/14/22 1142 06/14/22 1901   06/12/22 1230  vancomycin (VANCOREADY) IVPB 750 mg/150 mL        750 mg 150 mL/hr over 60 Minutes Intravenous  Once 06/12/22 1142 06/12/22 1351   06/11/22 2200  vancomycin (VANCOREADY) IVPB 750 mg/150 mL  Status:  Discontinued        750 mg 150 mL/hr over 60 Minutes Intravenous Every M-W-F (Hemodialysis) 06/11/22 1559 06/13/22 2043   06/11/22 1600  ceFEPIme (MAXIPIME) 2 g in sodium chloride 0.9 % 100 mL IVPB  Status:  Discontinued        2 g 200 mL/hr over 30 Minutes Intravenous Every M-W-F (Hemodialysis) 06/11/22 1312 06/14/22 1136   06/10/22 2300  ceFEPIme (MAXIPIME) 1 g in sodium chloride 0.9 % 100 mL IVPB  Status:  Discontinued        1 g 200 mL/hr over 30 Minutes Intravenous Every 24 hours 06/09/22 2219 06/11/22 1312   06/10/22 1800  vancomycin (VANCOREADY) IVPB 750 mg/150 mL        750 mg 150 mL/hr over 60 Minutes Intravenous Every T-Th-Sa (Hemodialysis) 06/10/22 1302 06/10/22 2030   06/09/22 2230  ceFEPIme (MAXIPIME) 2 g in sodium chloride 0.9 % 100 mL IVPB        2 g 200 mL/hr over 30 Minutes Intravenous  Once 06/09/22 2219 06/10/22 0005   06/09/22 2230  vancomycin (VANCOREADY) IVPB 1750 mg/350 mL        1,750 mg 175 mL/hr over  120 Minutes Intravenous  Once 06/09/22 2219 06/10/22 0327   06/09/22 2219  vancomycin variable dose per  unstable renal function (pharmacist dosing)  Status:  Discontinued         Does not apply See admin instructions 06/09/22 2219 06/19/22 1049      Culture/Microbiology    Component Value Date/Time   SDES FLUID PLEURAL 06/18/2022 1351   SPECREQUEST NONE 06/18/2022 1351   CULT  06/18/2022 1351    No growth aerobically or anaerobically. Performed at [facility], [place] [address] [place], [place] [address]    REPTSTATUS 06/23/2022 FINAL 06/18/2022 1351  Radiology Studies: CT CHEST W CONTRAST  Result Date: 06/23/2022 CLINICAL DATA:  Pneumonia, complication suspected. EXAM: CT CHEST WITH CONTRAST TECHNIQUE: Multidetector CT imaging of the chest was performed during intravenous contrast administration. RADIATION DOSE REDUCTION: This exam was performed according to the departmental dose-optimization program which includes automated exposure control, adjustment of the mA and/or kV according to patient size and/or use of iterative reconstruction technique. CONTRAST:  76m OMNIPAQUE IOHEXOL 350 MG/ML SOLN COMPARISON:  06/13/2022. FINDINGS: Cardiovascular: The heart is enlarged and there is a small pericardial effusion. Three-vessel coronary artery calcifications are noted. There is atherosclerotic calcification of the aorta without evidence of aneurysm. The pulmonary trunk is distended suggesting underlying pulmonary artery hypertension. Mediastinum/Nodes: No mediastinal, hilar, or axillary lymphadenopathy by size criteria. The thyroid gland, trachea, and esophagus are within normal limits. Lungs/Pleura: There is a small to moderate loculated pleural effusion on the left, decreased from the prior exam. A chest tube terminates in the collection in the left lower lobe. Bronchial wall thickening is noted in the left lower lobe. Strandy atelectasis or infiltrate in the left upper and lower lobes, decreased from the prior exam. Dependent atelectasis is noted on the right. No pneumothorax. Upper Abdomen:  Scattered diverticula are present along the colon. There is bilateral renal atrophy with cortical thinning and possible cysts. No acute abnormality. Musculoskeletal: Degenerative changes are present in the thoracic spine. No acute or suspicious osseous abnormality. A vascular stent is noted in the right upper extremity. IMPRESSION: 1. Small loculated pleural effusion on the left with a chest tube in place and decreased in size from the prior exam. 2. Decreased airspace opacities in the left lung. 3. Coronary artery calcifications and aortic atherosclerosis. Electronically Signed   By: LBrett FairyM.D.   On: 06/23/2022 01:53     LOS: 15 days   RAntonieta Pert MD [facility]  06/24/2022, 8:39 AM

## 2022-06-24 NOTE — Inpatient Diabetes Management (Signed)
Inpatient Diabetes Program Recommendations  AACE/ADA: New Consensus Statement on Inpatient Glycemic Control (2015)  Target Ranges:  Prepandial:   less than 140 mg/dL      Peak postprandial:   less than 180 mg/dL (1-2 hours)      Critically ill patients:  140 - 180 mg/dL   Lab Results  Component Value Date   GLUCAP 78 06/24/2022   HGBA1C 8.8 (H) 06/02/2022    Review of Glycemic Control  Latest Reference Range & Units 06/21/22 07:36  GFR, Estimated >60 mL/min 9 (L)  (L): Data is abnormally low  Latest Reference Range & Units 06/24/22 07:12 06/24/22 11:09 06/24/22 11:54  Glucose-Capillary 70 - 99 mg/dL 129 (H)  Novolog 2 units  47 (L) 78  (H): Data is abnormally high (L): Data is abnormally low   Current orders for Inpatient glycemic control: Semglee 10 units QD, Novolog 0-15 units TID and 0-5 QHS  Inpatient Diabetes Program Recommendations:    Novolog 0-6 units TID and 0-5 units QHS  Will continue to follow while inpatient.  Thank you, Reche Dixon, MSN, [place] Diabetes Coordinator Inpatient Diabetes Program [phone] (team pager from 8a-5p)

## 2022-06-24 NOTE — TOC Progression Note (Signed)
Transition of Care [facility]) - Progression Note    Patient Details  Name: [name] MRN: [id number] Date of Birth: 05/21/1941  Transition of [facility]) CM/SW Contact  Tom-Johnson, Renea Ee, RN Phone Number: 06/24/2022, 3:13 PM  Clinical Narrative:     Chest tube d/c'd yesterday 06/23/22. OT reassessed and recommended SNF. TOC will continue to follow.        Expected Discharge Plan: Home/Self Care Barriers to Discharge: Continued Medical Work up  Expected Discharge Plan and Services In-house Referral: Clinical Social Work     Living arrangements for the past 2 months: Single Family Home Expected Discharge Date: 06/12/22                         [facility] Arranged: PT   Date [place]: 06/12/22       Social Determinants of Health ([place]) Interventions [place]: No Food Insecurity (06/13/2022)  Housing: Low Risk  (06/13/2022)  Transportation Needs: No Transportation Needs (06/13/2022)  Utilities: Not At Risk (06/13/2022)  Tobacco Use: Low Risk  (06/10/2022)    Readmission Risk Interventions    06/12/2022    3:57 PM 06/10/2022   11:01 AM 01/17/2020    9:17 AM  Readmission Risk Prevention Plan  Transportation Screening Complete Complete Complete  PCP or Specialist Appt within 5-7 Days   Not Complete  Not Complete comments   pending disposition  Home Care Screening   Complete  Medication Review (RN CM)   Referral to Pharmacy  [facility] or [place] Complete Complete   Social Work Consult for [place] Planning/Counseling Complete Complete   Palliative Care Screening Not Applicable Not Applicable   Medication Review Press photographer) Complete Complete

## 2022-06-24 NOTE — Progress Notes (Signed)
Patient ID: [name], female   DOB: 06/12/1941, 82 y.o.   MRN: [id number]  [facility] Progress Note   Assessment/ Plan:   1.  Acute hypoxic respiratory failure: Secondary to loculated left-sided pleural effusion status post pleural drain on 06/18/2022 allowed by thrombolytic therapy on 1/5.  She thereafter developed a hemothorax from intrapleural thrombolytics.  Chest tube discontinued yesterday by pulmonary service and I appreciate recommendations for follow-up by Dr. Carlis Abbott. 2. ESRD: She underwent hemodialysis with mild intradialytic hypotension yesterday and overnight has not had any problems with breathing, chest pain or blood pressures.  She appears stable for discharge home with outpatient dialysis at her home unit-[facility]. 3. Anemia: Secondary to losses associated with hemothorax as well as underlying chronic kidney disease/ESRD.  Continue ESA. 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 5. Nutrition: Continue current renal diet with oral nutritional/protein supplementation. 6. Hypertension: Blood pressures "soft" with ongoing labetalol.  Subjective:   Reports to be feeling better this morning status post removal of chest tube yesterday and denies any complaints at this time.   Objective:   BP (!) 129/44   Pulse 88   Temp 98.3 F (36.8 C) (Oral)   Resp 18   Ht '5\' 7"'$  (1.702 m)   Wt 65.2 kg   SpO2 100%   BMI 22.51 kg/m   Physical Exam: Gen: Sitting up comfortably in bed, watching television.  Not on supplemental oxygen CVS: Pulse regular rhythm, normal rate, S1 and S2 normal Resp: Decreased inspiratory effort with diminished breath sounds at the bases otherwise clear to auscultation Abd: Soft, flat, nontender Ext: No lower extremity edema.  Right upper arm AV fistula is slightly pulsatile  Labs: BMET Recent Labs  Lab 06/19/22 0348 06/20/22 0846 06/21/22 0736  NA 135 134* 137  K 3.9 4.6  4.4  CL 94* 94* 96*  CO2 '27 26 29  '$ GLUCOSE 259* 170* 78  BUN '16 23 13  '$ CREATININE 4.22* 5.88* 4.43*  CALCIUM 7.8* 7.9* 7.8*  PHOS 3.1 4.0  --    CBC Recent Labs  Lab 06/20/22 0846 06/21/22 0736 06/21/22 1701 06/21/22 2334 06/23/22 0240  WBC 11.6* 11.5* 11.4*  --  11.8*  HGB 8.5* 8.0* 7.0* 9.1* 8.4*  HCT 25.7* 23.9* 21.3* 26.1* 23.9*  MCV 94.1 94.8 95.5  --  90.2  PLT 170 174 182  --  187      Medications:     Chlorhexidine Gluconate Cloth  6 each Topical Q0600   Chlorhexidine Gluconate Cloth  6 each Topical Q0600   darbepoetin (ARANESP) injection - DIALYSIS  60 mcg Subcutaneous Q Mon-1800   dextromethorphan-guaiFENesin  1 tablet Oral BID   hydrocerin   Topical BID   insulin aspart  0-15 Units Subcutaneous TID WC   insulin aspart  0-5 Units Subcutaneous QHS   insulin glargine-yfgn  10 Units Subcutaneous Daily   labetalol  200 mg Oral BID   pantoprazole  40 mg Oral Daily   QUEtiapine  12.5 mg Oral QHS   sevelamer carbonate  800 mg Oral BID AC   sodium chloride flush  10 mL Intrapleural Q8H   Elmarie Shiley, MD 06/24/2022, 9:26 AM

## 2022-06-24 NOTE — Progress Notes (Signed)
Blood glucose 47, pt. Asymptomatic, 4 oz of juice and a sandwich provided, MD aware, will cont. To monitor

## 2022-06-24 NOTE — Evaluation (Signed)
Occupational Therapy Evaluation Patient Details Name: [name] MRN: [id number] DOB: 1940-10-29 Today's Date: 06/24/2022   History of Present Illness 82 y.o. female admitted on 06/07/2022 with left-sided pleural effusion, pneumonia and dyspnea. PMHx: ESRD, diabetes mellitus type 2, GERD, hypertension, history of stroke.  New OT order placed 1/9 for consideration of post acute rehab.   Clinical Impression   Patient with new OT order to assess rehab potential and disposition.  Patient has been having low blood sugars this date, but current reading of 97, and RN okay with OT assessment.  Patient very [place], having difficulty following commands, and initiating movements without tactile cues.  Patient needing Mod A for basic bed mobility, and due to significant jerking type movements in both bilateral upper and lower extremities, making transfers and ADL completion beyond bedlevel unsafe.  Patient was able to stand with Mod A, but unable to hold her own weight, +2 for safety.  Patient would need to have 24 hour near max A to transition home, and it does not appear that she has that level of assist.  OT will follow in the acute setting, with SNF recommended for post acute rehab prior to returning home.       Recommendations for follow up therapy are one component of a multi-disciplinary discharge planning process, led by the attending physician.  Recommendations may be updated based on patient status, additional functional criteria and insurance authorization.   Follow Up Recommendations  Skilled nursing-short term rehab (<3 hours/day)     Assistance Recommended at Discharge Frequent or constant Supervision/Assistance  Patient can return home with the following A lot of help with bathing/dressing/bathroom;A lot of help with walking and/or transfers;Assist for transportation;Assistance with cooking/housework;Assistance with feeding;Help with stairs or ramp for entrance    Functional Status  Assessment  Patient has had a recent decline in their functional status and demonstrates the ability to make significant improvements in function in a reasonable and predictable amount of time.  Equipment Recommendations  BSC/3in1    Recommendations for Other Services       Precautions / Restrictions Precautions Precautions: Fall Precaution Comments: Currenlty with low blood sugars Restrictions Weight Bearing Restrictions: No      Mobility Bed Mobility Overal bed mobility: Needs Assistance Bed Mobility: Supine to Sit, Sit to Supine     Supine to sit: Mod assist Sit to supine: Mod assist        Transfers Overall transfer level: Needs assistance Equipment used: None Transfers: Sit to/from Stand Sit to Stand: Mod assist           General transfer comment: unable to take side steps due to jerking style movements and LOA.      Balance Overall balance assessment: Needs assistance Sitting-balance support: Feet supported Sitting balance-Leahy Scale: Poor   Postural control: Posterior lean, Right lateral lean Standing balance support: Bilateral upper extremity supported Standing balance-Leahy Scale: Poor                             ADL either performed or assessed with clinical judgement   ADL Overall ADL's : Needs assistance/impaired     Grooming: Set up;Bed level   Upper Body Bathing: Moderate assistance;Bed level   Lower Body Bathing: Maximal assistance;Bed level   Upper Body Dressing : Maximal assistance;Bed level   Lower Body Dressing: Maximal assistance;Total assistance;Bed level   Toilet Transfer: Maximal assistance;Squat-pivot;BSC/3in1  Vision   Vision Assessment?: No apparent visual deficits     Perception     Praxis      Pertinent Vitals/Pain Pain Assessment Pain Assessment: No/denies pain Pain Intervention(s): Monitored during session     Hand Dominance Right   Extremity/Trunk Assessment Upper  Extremity Assessment Upper Extremity Assessment: Generalized weakness;RUE deficits/detail;LUE deficits/detail RUE Deficits / Details: jerking type movements to B UE's. RUE Coordination: decreased fine motor;decreased gross motor LUE Deficits / Details: jerking type movements. LUE Coordination: decreased gross motor;decreased fine motor   Lower Extremity Assessment Lower Extremity Assessment: Defer to PT evaluation   Cervical / Trunk Assessment Cervical / Trunk Assessment: Kyphotic   Communication Communication Communication: No difficulties   Cognition Arousal/Alertness: Lethargic Behavior During Therapy: Flat affect Overall Cognitive Status: No family/caregiver present to determine baseline cognitive functioning Area of Impairment: Memory, Safety/judgement, Awareness, Attention                 Orientation Level: Disoriented to, Place, Time, Situation Current Attention Level: Focused   Following Commands: Follows one step commands inconsistently, Follows one step commands with increased time Safety/Judgement: Decreased awareness of deficits, Decreased awareness of safety Awareness: Intellectual Problem Solving: Slow processing, Difficulty sequencing, Decreased initiation, Requires verbal cues, Requires tactile cues       General Comments   VSS on RA    Exercises     Shoulder Instructions      Home Living Family/patient expects to be discharged to:: Private residence Living Arrangements: Children;Alone Available Help at Discharge: Available 24 hours/day Type of Home: House Home Access: Stairs to enter [employer] of Steps: 3 Entrance Stairs-Rails: Can reach both Home Layout: One level     Bathroom Shower/Tub: Teacher, early years/pre: Standard Bathroom Accessibility: Yes How Accessible: Accessible via walker Home Equipment: Shower seat;Wheelchair - Publishing copy (2 wheels)          Prior Functioning/Environment Prior Level  of Function : Needs assist       Physical Assist : Mobility (physical);ADLs (physical) Mobility (physical): Bed mobility;Transfers;Stairs;Gait ADLs (physical): Bathing;Dressing;IADLs Mobility Comments: patient stating household ambulation with RW ADLs Comments: Pt is assisted to wash her back when bathing. Set up assist for dressing. Pt reports ablity to toilet, groom, and feed without assist. Family assist with IADL's.        OT Problem List: Decreased strength;Decreased activity tolerance;Impaired balance (sitting and/or standing);Decreased safety awareness      OT Treatment/Interventions: Self-care/ADL training;Therapeutic exercise;Therapeutic activities;Patient/family education;Balance training    OT Goals(Current goals can be found in the care plan section) Acute Rehab OT Goals Patient Stated Goal: none stated OT Goal Formulation: With patient Time For Goal Achievement: 07/08/22 Potential to Achieve Goals: Fair ADL Goals Pt Will Perform Grooming: with supervision;standing Pt Will Perform Upper Body Dressing: with supervision;sitting Pt Will Perform Lower Body Dressing: with min assist;sit to/from stand Pt Will Transfer to Toilet: with min assist;stand pivot transfer;bedside commode Pt/caregiver will Perform Home Exercise Program: Increased strength;Both right and left upper extremity;With Supervision  OT Frequency: Min 2X/week    Co-evaluation              AM-PAC OT "6 Clicks" Daily Activity     Outcome Measure Help from another person eating meals?: A Lot Help from another person taking care of personal grooming?: A Lot Help from another person toileting, which includes using toliet, bedpan, or urinal?: A Lot Help from another person bathing (including washing, rinsing, drying)?: A Lot Help from another person to put  on and taking off regular upper body clothing?: A Lot Help from another person to put on and taking off regular lower body clothing?: A Lot 6 Click  Score: 12   End of Session Equipment Utilized During Treatment: Gait belt Nurse Communication: Mobility status  Activity Tolerance: Patient limited by fatigue;Other (comment) (Blood sugar has been up and down this date) Patient left: in bed;with call bell/phone within reach  OT Visit Diagnosis: Unsteadiness on feet (R26.81);Other abnormalities of gait and mobility (R26.89);Muscle weakness (generalized) (M62.81)                Time: 1330-1350 OT Time Calculation (min): 20 min Charges:  OT General Charges $OT Visit: 1 Visit OT Evaluation $OT Eval Moderate Complexity: 1 Mod  06/24/2022  RP, OTR/L  Acute Rehabilitation Services  Office:  [phone]   Metta Clines 06/24/2022, 2:04 PM

## 2022-06-25 DIAGNOSIS — J90 Pleural effusion, not elsewhere classified: Secondary | ICD-10-CM | POA: Diagnosis not present

## 2022-06-25 LAB — CBC
HCT: 23 % — ABNORMAL LOW (ref 36.0–46.0)
Hemoglobin: 7.7 g/dL — ABNORMAL LOW (ref 12.0–15.0)
MCH: 31.7 pg (ref 26.0–34.0)
MCHC: 33.5 g/dL (ref 30.0–36.0)
MCV: 94.7 fL (ref 80.0–100.0)
Platelets: 187 10*3/uL (ref 150–400)
RBC: 2.43 MIL/uL — ABNORMAL LOW (ref 3.87–5.11)
RDW: 16.9 % — ABNORMAL HIGH (ref 11.5–15.5)
WBC: 9.5 10*3/uL (ref 4.0–10.5)
nRBC: 0.4 % — ABNORMAL HIGH (ref 0.0–0.2)

## 2022-06-25 LAB — RENAL FUNCTION PANEL
Albumin: 2.2 g/dL — ABNORMAL LOW (ref 3.5–5.0)
Anion gap: 13 (ref 5–15)
BUN: 29 mg/dL — ABNORMAL HIGH (ref 8–23)
CO2: 28 mmol/L (ref 22–32)
Calcium: 7.4 mg/dL — ABNORMAL LOW (ref 8.9–10.3)
Chloride: 93 mmol/L — ABNORMAL LOW (ref 98–111)
Creatinine, Ser: 7.63 mg/dL — ABNORMAL HIGH (ref 0.44–1.00)
GFR, Estimated: 5 mL/min — ABNORMAL LOW (ref >60)
Glucose, Bld: 123 mg/dL — ABNORMAL HIGH (ref 70–99)
Phosphorus: 3.2 mg/dL (ref 2.5–4.6)
Potassium: 4.9 mmol/L (ref 3.5–5.1)
Sodium: 134 mmol/L — ABNORMAL LOW (ref 135–145)

## 2022-06-25 LAB — GLUCOSE, CAPILLARY
Glucose-Capillary: 65 mg/dL — ABNORMAL LOW (ref 70–99)
Glucose-Capillary: 135 mg/dL — ABNORMAL HIGH (ref 70–99)
Glucose-Capillary: 90 mg/dL (ref 70–99)
Glucose-Capillary: 92 mg/dL (ref 70–99)
Glucose-Capillary: 91 mg/dL (ref 70–99)
Glucose-Capillary: 114 mg/dL — ABNORMAL HIGH (ref 70–99)
Glucose-Capillary: 107 mg/dL — ABNORMAL HIGH (ref 70–99)
Glucose-Capillary: 99 mg/dL (ref 70–99)

## 2022-06-25 MED ORDER — DEXTROSE 50 % IV SOLN
INTRAVENOUS | Status: AC
  Administered 2022-06-25: 25 mL
  Filled 2022-06-25: qty 50

## 2022-06-25 NOTE — Progress Notes (Signed)
Physical Therapy Note  Continuing to follow;  Noted pt's performance and functional level with recent OT eval;  [name] functional level has varied widely in PT sessions as well, seems to perform better and need less assist when family is present;  Given her needs for Moderate Assist and at times Max Assist for safe mobility, to consider SNF for psot-acute rehab is very reasonable;   This does point to the need to get very clear on how much assist is available to her at home;  Hopeful for PT session today after HD, which will also give [place] good info re: functional status post HD  [name], [place] Office [phone]

## 2022-06-25 NOTE — Progress Notes (Signed)
PROGRESS NOTE [name]  HAL:[id number] DOB: [birth year] DOA: 06/09/2022 PCP: [name], MD   Brief Narrative/Hospital Course:  82 y.o. female with a history of ESRD, diabetes mellitus type 2, GERD, hypertension, stroke-. Patient presented secondary to shortness of breath and found to have recurrent pneumonia with associated left sided pleural effusion. Empiric antibiotics initiated and patient transferred to [facility] for consideration of chest tube placement. During admission, patient developed hospital delirium.  Patient was seen by nephrology continue on hemodialysis.  IR consulted underwent left 12 French pleural drain placed by IR 1/3, followed by PCCM, s/p thrombolytic instillation on 1/4. Patient continued chest tube in place.  1/7 bloody drainage noted from chest tube Chest tube removed 1/8 evening and did well since then doing well on room air.  Seen by nephrology for ongoing dialysis per schedule. Having few episodes of hypoglycemia insulin completely discontinued oral diet encouraged   Subjective: Seen and examined this morning in dialysis earlier notified nursing staff that patient had low blood sugar needing D10 push  She refused to eat. She was in dialysis alert awake blood sugar had improved.   Spoke with dialysis nurse to see if she can be fed while there.  Assessment and Plan: Principal Problem:   Pleural effusion Active Problems:   Essential hypertension   Thrombocytopenia (HCC)   ESRD (end stage renal disease) (HCC)   DMII (diabetes mellitus, type 2) (HCC)   GERD (gastroesophageal reflux disease)   CAP (community acquired pneumonia)   Delirium   Need for management of chest tube   Hemothorax on left   Loculated pleural effusion   Acute blood loss anemia   Hemothorax   Recurrent left exudative pleural effusion Now left hemothorax after intrapleural Lytics: Effusion from multifocal pneumonia in mid December.  Overall afebrile leukocytosis  resolved, on room air. Cultures negative MRSA swab was negative.  Chest tube placed by IR 1/3, thrombolytics instilled 1/5.patient completed antibiotics.  CT 1/8 showed a small loculated pleural effusion on the left with chest tube in place with decrease in size from prior exam decreased airspace opacities in the left lung.Chest tube removed 1/8 no need for follow-up imaging unless he has respiratory issues.  No further plan per pulmonary increase activity   Acute blood loss anemia in the setting of chronic anemia: Improved after transfusion.  Monitor outpatient Recent Labs  Lab 06/21/22 0736 06/21/22 1701 06/21/22 2334 06/23/22 0240 06/25/22 0851  HGB 8.0* 7.0* 9.1* 8.4* 7.7*  HCT 23.9* 21.3* 26.1* 23.9* 23.0*    Delirium:In the setting of history of multiple vascular injuries and current hospitalization. Likely hospital related delirium.  Improved, CT head unremarkable continue Seroquel at nighttime, continue fall precautions supportive care delirium precaution.  Some baseline confusion but stable   GERD continue PPI   Type 2 diabetes mellitus with hyperglycemia : Long-term insulin along with semaglutide SSI has been discontinued.  Encourage oral intake.   Recent Labs  Lab 06/24/22 2048 06/25/22 0705 06/25/22 0730 06/25/22 0958 06/25/22 1129  GLUCAP 149* 65* 135* 90 92    ESRD on HD MWF last HD 1/10 Thrombocytopenia: Improved.  Monitor Essential hypertension BP stable continue labetalol Chest pain resolved likely due to hypertension Deconditioning debility weakness: Will request PT OT reevaluation , daughter is concerned that she may not able to get her in and out of a car or back into the house. asked social worker to look into whether she qualifies for skilled nursing facility.  OT reassessed and recommending skilled  nursing facility.  TOC working on SNF.  DVT prophylaxis: Place and maintain sequential compression device Start: 06/14/22 0721 SCDs Start: 06/09/22 2218 Code  Status:   Code Status: Full Code Family Communication: plan of care discussed with patient at bedside. Patient status is: inpatient  because of chest tube Level of care: Telemetry Medical   Dispo: The patient is from: home, lives with her son            Anticipated disposition: SNF once available  Objective: Vitals last 24 hrs: Vitals:   06/25/22 0930 06/25/22 1000 06/25/22 1030 06/25/22 1100  BP: (!) 116/43 (!) 111/44 (!) 91/49 (!) 109/44  Pulse: 74 77 75 74  Resp: '10 14 19 10  '$ Temp:      TempSrc:      SpO2: 97% 96% 96% 95%  Weight:      Height:       Weight change:   Physical Examination: General exam: Alert awake mildly confused pleasant  HEENT:Oral mucosa moist, Ear/Nose WNL grossly, dentition normal. Respiratory system: Bilaterally clear  Cardiovascular system: S1 & S2 +, regular rate. Gastrointestinal system: Abdomen soft,ND NT Nervous System:Alert, awake, moving extremities and grossly nonfocal Extremities: RUE AVF+ Skin: No rashes,no icterus. MSK: Normal muscle bulk,tone, power   Medications reviewed:  Scheduled Meds:  Chlorhexidine Gluconate Cloth  6 each Topical Q0600   Chlorhexidine Gluconate Cloth  6 each Topical Q0600   darbepoetin (ARANESP) injection - DIALYSIS  60 mcg Subcutaneous Q Mon-1800   dextromethorphan-guaiFENesin  1 tablet Oral BID   hydrocerin   Topical BID   labetalol  200 mg Oral BID   pantoprazole  40 mg Oral Daily   QUEtiapine  12.5 mg Oral QHS   sevelamer carbonate  800 mg Oral BID AC   sodium chloride flush  10 mL Intrapleural Q8H   Continuous Infusions:    Diet Order             Diet Heart Room service appropriate? Yes; Fluid consistency: Thin  Diet effective now                   Intake/Output Summary (Last 24 hours) at 06/25/2022 1142 Last data filed at 06/25/2022 0605 Gross per 24 hour  Intake 340 ml  Output 0 ml  Net 340 ml   Net IO Since Admission: -9,687.4 mL [06/25/22 1142]  Wt Readings from Last 3 Encounters:   06/25/22 63 kg  06/04/22 73.8 kg  05/09/22 72.9 kg     Unresulted Labs (From admission, onward)    None     Data Reviewed: I have personally reviewed following labs and imaging studies CBC: Recent Labs  Lab 06/20/22 0846 06/21/22 0736 06/21/22 1701 06/21/22 2334 06/23/22 0240 06/25/22 0851  WBC 11.6* 11.5* 11.4*  --  11.8* 9.5  HGB 8.5* 8.0* 7.0* 9.1* 8.4* 7.7*  HCT 25.7* 23.9* 21.3* 26.1* 23.9* 23.0*  MCV 94.1 94.8 95.5  --  90.2 94.7  PLT 170 174 182  --  187 973   Basic Metabolic Panel: Recent Labs  Lab 06/19/22 0348 06/20/22 0846 06/21/22 0736 06/25/22 0851  NA 135 134* 137 134*  K 3.9 4.6 4.4 4.9  CL 94* 94* 96* 93*  CO2 '27 26 29 28  '$ GLUCOSE 259* 170* 78 123*  BUN '16 23 13 '$ 29*  CREATININE 4.22* 5.88* 4.43* 7.63*  CALCIUM 7.8* 7.9* 7.8* 7.4*  MG 1.7  --  1.6*  --   PHOS 3.1 4.0  --  3.2  GFR: Estimated Creatinine Clearance: 5.5 mL/min (A) (by C-G formula based on SCr of 7.63 mg/dL (H)). Liver Function Tests: Recent Labs  Lab 06/20/22 0846 06/25/22 0851  ALBUMIN 2.0* 2.2*  Coagulation Profile: Recent Labs  Lab 06/21/22 1701  INR 1.3*  CBG: Recent Labs  Lab 06/24/22 2048 06/25/22 0705 06/25/22 0730 06/25/22 0958 06/25/22 1129  GLUCAP 149* 65* 135* 90 92   Recent Results (from the past 240 hour(s))  Aerobic/Anaerobic Culture w Gram Stain (surgical/deep wound)     Status: None   Collection Time: 06/18/22  1:51 PM   Specimen: Pleural Fluid  Result Value Ref Range Status   Specimen Description FLUID PLEURAL  Final   Special Requests NONE  Final   Gram Stain NO WBC SEEN NO ORGANISMS SEEN   Final   Culture   Final    No growth aerobically or anaerobically. Performed at [facility], [place] [address] [place], [place] [address]    Report Status 06/23/2022 FINAL  Final    Antimicrobials: Anti-infectives (From admission, onward)    Start     Dose/Rate Route Frequency Ordered Stop   06/18/22 1200  vancomycin (VANCOREADY) IVPB 750  mg/150 mL  Status:  Discontinued        750 mg 150 mL/hr over 60 Minutes Intravenous Every M-W-F (Hemodialysis) 06/14/22 1156 06/19/22 1049   06/15/22 1800  vancomycin (VANCOREADY) IVPB 750 mg/150 mL        750 mg 150 mL/hr over 60 Minutes Intravenous Once in dialysis 06/15/22 1151 06/16/22 0130   06/14/22 2200  piperacillin-tazobactam (ZOSYN) IVPB 2.25 g  Status:  Discontinued        2.25 g 100 mL/hr over 30 Minutes Intravenous Every 8 hours 06/14/22 1806 06/19/22 1049   06/14/22 1800  ceFEPIme (MAXIPIME) 1 g in sodium chloride 0.9 % 100 mL IVPB  Status:  Discontinued        1 g 200 mL/hr over 30 Minutes Intravenous Every 24 hours 06/14/22 1136 06/14/22 1803   06/14/22 1230  vancomycin (VANCOREADY) IVPB 750 mg/150 mL        750 mg 150 mL/hr over 60 Minutes Intravenous  Once 06/14/22 1142 06/14/22 1901   06/12/22 1230  vancomycin (VANCOREADY) IVPB 750 mg/150 mL        750 mg 150 mL/hr over 60 Minutes Intravenous  Once 06/12/22 1142 06/12/22 1351   06/11/22 2200  vancomycin (VANCOREADY) IVPB 750 mg/150 mL  Status:  Discontinued        750 mg 150 mL/hr over 60 Minutes Intravenous Every M-W-F (Hemodialysis) 06/11/22 1559 06/13/22 2043   06/11/22 1600  ceFEPIme (MAXIPIME) 2 g in sodium chloride 0.9 % 100 mL IVPB  Status:  Discontinued        2 g 200 mL/hr over 30 Minutes Intravenous Every M-W-F (Hemodialysis) 06/11/22 1312 06/14/22 1136   06/10/22 2300  ceFEPIme (MAXIPIME) 1 g in sodium chloride 0.9 % 100 mL IVPB  Status:  Discontinued        1 g 200 mL/hr over 30 Minutes Intravenous Every 24 hours 06/09/22 2219 06/11/22 1312   06/10/22 1800  vancomycin (VANCOREADY) IVPB 750 mg/150 mL        750 mg 150 mL/hr over 60 Minutes Intravenous Every T-Th-Sa (Hemodialysis) 06/10/22 1302 06/10/22 2030   06/09/22 2230  ceFEPIme (MAXIPIME) 2 g in sodium chloride 0.9 % 100 mL IVPB        2 g 200 mL/hr over 30 Minutes Intravenous  Once 06/09/22 2219  06/10/22 0005   06/09/22 2230  vancomycin  (VANCOREADY) IVPB 1750 mg/350 mL        1,750 mg 175 mL/hr over 120 Minutes Intravenous  Once 06/09/22 2219 06/10/22 0327   06/09/22 2219  vancomycin variable dose per unstable renal function (pharmacist dosing)  Status:  Discontinued         Does not apply See admin instructions 06/09/22 2219 06/19/22 1049      Culture/Microbiology    Component Value Date/Time   SDES FLUID PLEURAL 06/18/2022 1351   SPECREQUEST NONE 06/18/2022 1351   CULT  06/18/2022 1351    No growth aerobically or anaerobically. Performed at [facility], [place] [address], [place], [place] [address]    REPTSTATUS 06/23/2022 FINAL 06/18/2022 1351  Radiology Studies: No results found.   LOS: 16 days   Antonieta Pert, MD [facility]  06/25/2022, 11:42 AM

## 2022-06-25 NOTE — [place] FL2 (Signed)
[place] MEDICAID FL2 LEVEL OF CARE FORM     IDENTIFICATION  Patient Name: [name] Birthdate: 07-Jan-1941 Sex: female Admission Date (Current Location): 06/09/2022  [place] and [place] Number:  Herbalist and Address:  [facility], [place] [address] [place], [place] [address]      Provider Number: [id number]  Attending Physician Name and Address:  Antonieta Pert, MD  Relative Name and Phone Number:  Kymiah, Araiza (Daughter) [phone]    Current Level of Care: Hospital Recommended Level of Care: [place] Prior Approval Number:    Date Approved/Denied:   PASRR Number: [id number]  Discharge Plan: SNF    Current Diagnoses: Patient Active Problem List   Diagnosis Date Noted   Acute blood loss anemia 06/22/2022   Hemothorax 06/22/2022   Hemothorax on left 06/21/2022   Loculated pleural effusion 06/21/2022   Need for management of chest tube 06/20/2022   Delirium 06/14/2022   GERD (gastroesophageal reflux disease) 06/10/2022   CAP (community acquired pneumonia) 06/10/2022   ESRD (end stage renal disease) ([place]) 06/09/2022   DMII (diabetes mellitus, type 2) ([place]) 06/09/2022   Multifocal pneumonia 06/02/2022   Pleural effusion 06/02/2022   End stage renal disease on dialysis [facility]) 06/02/2022   Spinal stenosis of lumbar region 01/07/2022   Schatzki's ring 10/15/2021   Esophageal dysphagia 10/15/2021   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   Hypertensive heart and CKD, ESRD on dialysis ([place]) 11/09/2020   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 due to type 2 diabetes mellitus ([place]) 11/09/2020   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 diabetes mellitus ([place]) 11/09/2020   Dependence on renal dialysis ([place]) 11/09/2020   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 disease ([place]) 11/09/2020   Hyperlipidemia associated with type 2 diabetes mellitus ([place]) 11/09/2020   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 ([place]) 11/09/2020   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 ([place]) 11/09/2020   Chronic constipation 11/09/2020   Vitamin B 12 deficiency 11/09/2020   Acute respiratory failure with hypoxia ([place]) 10/26/2020   Elevated troponin 10/26/2020   Volume overload 10/26/2020   Hypertensive urgency 10/26/2020   History of CVA (cerebrovascular accident) 01/05/2020   Thrombocytopenia ([place]) 01/05/2020   History of colonic polyps 04/01/2018   Acute bronchitis 10/15/2017   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 ([place])    HCAP (healthcare-associated pneumonia) 05/29/2015   Hyperkalemia 05/29/2015   ESRD (end stage renal disease) on dialysis ([place])    Gastro-esophageal reflux disease without esophagitis 09/29/2014   Encounter for adequacy testing for hemodialysis ([place]) 01/17/2014   Mechanical complication of other vascular device, implant, and graft 01/17/2014   Disturbances of vision, late effect of stroke 11/03/2013   Right hemiparesis ([place]) 08/19/2013   CVA (cerebral infarction) 07/20/2013   Gastroparesis due to DM ([place]) 07/01/2013   Type 2 diabetes mellitus with hyperglycemia ([place]) 06/23/2013   Fall 05/25/2012   HLD (hyperlipidemia) 05/20/2006   Hereditary and idiopathic peripheral neuropathy 05/20/2006   Essential hypertension 05/20/2006    Orientation RESPIRATION BLADDER Height & Weight     Self  Normal Continent Weight: 136 lb 11 oz (62 kg) Height:  '5\' 7"'$  (170.2 cm)  BEHAVIORAL SYMPTOMS/MOOD NEUROLOGICAL BOWEL NUTRITION STATUS      Continent Diet (see d/c summary)  AMBULATORY STATUS COMMUNICATION OF NEEDS Skin   Extensive Assist Verbally Normal  Personal Care Assistance Level of Assistance  Bathing, Feeding, Dressing Bathing Assistance: Maximum assistance Feeding assistance: Limited assistance Dressing Assistance: Maximum assistance     Functional Limitations Info   Sight, Hearing, Speech Sight Info: Impaired Hearing Info: Impaired Speech Info: Adequate    SPECIAL CARE FACTORS FREQUENCY  PT (By licensed PT), OT (By licensed OT)     PT Frequency: 5x/ week OT Frequency: 5x/ week            Contractures      Additional Factors Info  Code Status, Allergies Code Status Info: Full Allergies Info: Ambien (Zolpidem)  Reglan (Metoclopramide)           Current Medications (06/25/2022):  This is the current hospital active medication list Current Facility-Administered Medications  Medication Dose Route Frequency Provider Last Rate Last Admin   acetaminophen (TYLENOL) tablet 650 mg  650 mg Oral Q6H PRN Zierle-Ghosh, Asia B, DO   650 mg at 06/18/22 1642   Or   acetaminophen (TYLENOL) suppository 650 mg  650 mg Rectal Q6H PRN Zierle-Ghosh, Asia B, DO       albuterol (VENTOLIN HFA) 108 (90 Base) MCG/ACT inhaler 2 puff  2 puff Inhalation Q2H PRN Zierle-Ghosh, Asia B, DO   2 puff at 06/10/22 0211   Chlorhexidine Gluconate Cloth 2 % PADS 6 each  6 each Topical Q0600 Donato Heinz, MD   6 each at 06/20/22 0501   Chlorhexidine Gluconate Cloth 2 % PADS 6 each  6 each Topical Q0600 Rosita Fire, MD   6 each at 06/25/22 [phone]   Darbepoetin Alfa (ARANESP) injection 60 mcg  60 mcg Subcutaneous Q Mon-1800 Mariel Aloe, MD   60 mcg at 06/24/22 0309   dextromethorphan-guaiFENesin (Alexander DM) 30-600 MG per 12 hr tablet 1 tablet  1 tablet Oral BID Zierle-Ghosh, Asia B, DO   1 tablet at 06/24/22 2214   fentaNYL (SUBLIMAZE) injection 25 mcg  25 mcg Intravenous Q2H PRN Kristopher Oppenheim, DO   25 mcg at 06/20/22 0501   hydrocerin (EUCERIN) cream   Topical BID Mariel Aloe, MD   Given at 06/25/22 1403   hydrOXYzine (ATARAX) tablet 25 mg  25 mg Oral TID PRN Roxan Hockey, MD   25 mg at 06/20/22 1527   labetalol (NORMODYNE) tablet 200 mg  200 mg Oral BID Zierle-Ghosh, Asia B, DO   200 mg at 06/25/22 1402   ondansetron (ZOFRAN) tablet 4 mg  4 mg Oral Q6H  PRN Zierle-Ghosh, Asia B, DO   4 mg at 06/11/22 2204   Or   ondansetron (ZOFRAN) injection 4 mg  4 mg Intravenous Q6H PRN Zierle-Ghosh, Asia B, DO   4 mg at 06/22/22 1717   oxyCODONE (Oxy IR/ROXICODONE) immediate release tablet 5 mg  5 mg Oral Q4H PRN Zierle-Ghosh, Asia B, DO   5 mg at 06/23/22 1424   pantoprazole (PROTONIX) EC tablet 40 mg  40 mg Oral Daily Zierle-Ghosh, Asia B, DO   40 mg at 06/25/22 1402   QUEtiapine (SEROQUEL) tablet 12.5 mg  12.5 mg Oral QHS Mariel Aloe, MD   12.5 mg at 06/24/22 1956   sevelamer carbonate (RENVELA) tablet 800 mg  800 mg Oral BID AC Zierle-Ghosh, Asia B, DO   800 mg at 06/24/22 1650   sodium chloride flush (NS) 0.9 % injection 10 mL  10 mL Intrapleural Q8H [name], MD   10 mL at 06/22/22 2107     Discharge Medications: Please see discharge summary for a  list of discharge medications.  Relevant Imaging Results:  Relevant Lab Results:   Additional Information [id number]. Dialysis: [facility] MWF 6am chair  Milinda Antis, LCSWA

## 2022-06-25 NOTE — Progress Notes (Signed)
PT Cancellation Note  Patient Details Name: [name] MRN: [id number] DOB: 1940-07-28   Cancelled Treatment:    Reason Eval/Treat Not Completed: Patient at procedure or test/unavailable  Currently in HD;   Will follow up later today as time allows;  Otherwise, will follow up for PT tomorrow;   Thank you,  [name], [place] [facility] [place] 06/25/2022, 8:41 AM

## 2022-06-25 NOTE — Plan of Care (Signed)
  Problem: Activity: Goal: Ability to tolerate increased activity will improve Outcome: Progressing   Problem: Clinical Measurements: Goal: Ability to maintain a body temperature in the normal range will improve Outcome: Progressing   Problem: Respiratory: Goal: Ability to maintain adequate ventilation will improve Outcome: Progressing Goal: Ability to maintain a clear airway will improve Outcome: Progressing   Problem: Education: Goal: Knowledge of General Education information will improve Description: Including pain rating scale, medication(s)/side effects and non-pharmacologic comfort measures Outcome: Progressing   Problem: Health Behavior/Discharge Planning: Goal: Ability to manage health-related needs will improve Outcome: Progressing   Problem: Clinical Measurements: Goal: Ability to maintain clinical measurements within normal limits will improve Outcome: Progressing Goal: Will remain free from infection Outcome: Progressing Goal: Diagnostic test results will improve Outcome: Progressing Goal: Respiratory complications will improve Outcome: Progressing Goal: Cardiovascular complication will be avoided Outcome: Progressing   Problem: Activity: Goal: Risk for activity intolerance will decrease Outcome: Progressing   Problem: Nutrition: Goal: Adequate nutrition will be maintained Outcome: Progressing   Problem: Coping: Goal: Level of anxiety will decrease Outcome: Progressing   Problem: Elimination: Goal: Will not experience complications related to bowel motility Outcome: Progressing Goal: Will not experience complications related to urinary retention Outcome: Progressing   Problem: Pain Managment: Goal: General experience of comfort will improve Outcome: Progressing   Problem: Safety: Goal: Ability to remain free from injury will improve Outcome: Progressing   Problem: Skin Integrity: Goal: Risk for impaired skin integrity will decrease Outcome:  Progressing   Problem: Education: Goal: Ability to describe self-care measures that may prevent or decrease complications (Diabetes Survival Skills Education) will improve Outcome: Progressing Goal: Individualized Educational Video(s) Outcome: Progressing   Problem: Coping: Goal: Ability to adjust to condition or change in health will improve Outcome: Progressing   Problem: Fluid Volume: Goal: Ability to maintain a balanced intake and output will improve Outcome: Progressing   Problem: Health Behavior/Discharge Planning: Goal: Ability to identify and utilize available resources and services will improve Outcome: Progressing Goal: Ability to manage health-related needs will improve Outcome: Progressing   Problem: Metabolic: Goal: Ability to maintain appropriate glucose levels will improve Outcome: Progressing   Problem: Nutritional: Goal: Maintenance of adequate nutrition will improve Outcome: Progressing Goal: Progress toward achieving an optimal weight will improve Outcome: Progressing   Problem: Skin Integrity: Goal: Risk for impaired skin integrity will decrease Outcome: Progressing   Problem: Tissue Perfusion: Goal: Adequacy of tissue perfusion will improve Outcome: Progressing   

## 2022-06-25 NOTE — Procedures (Signed)
Patient seen on Hemodialysis. BP (!) 100/42 (BP Location: Left Arm)   Pulse 74   Temp 98.8 F (37.1 C) (Axillary)   Resp 13   Ht '5\' 7"'$  (1.702 m)   Wt 63 kg   SpO2 99%   BMI 21.75 kg/m   QB 400, UF goal 2L Tolerating treatment without complaints at this time.   Appears clinically stable to DC home today.   Elmarie Shiley MD [facility]. Office # [phone] Pager # [phone] 9:25 AM

## 2022-06-25 NOTE — Progress Notes (Signed)
Blood glucose 65, pt. Sleepy but arouses to verbal stimuli and answers questions. Pt. Encouraged to eat her breakfast and drink some orange juice, but pt. Refused, 1/2 amp of D50 given per protocol, MD aware of all of the above, will cont. To monitor  [name]

## 2022-06-25 NOTE — Progress Notes (Signed)
Physical Therapy Treatment Patient Details Name: [name] MRN: [id number] DOB: 09/02/1940 Today's Date: 06/25/2022   History of Present Illness 82 y.o. female admitted on 06/07/2022 with left-sided pleural effusion, pneumonia and dyspnea. PMHx: ESRD, diabetes mellitus type 2, GERD, hypertension, history of stroke.  New OT order placed 1/9 for consideration of post acute rehab.    PT Comments    Pt initially was agreeable to physical therapy. Very lethargic and required Min A to initiate movement. Once sitting EOB pt did not progress further and was not following directions. Initiated back to Supine at [place] A and pt VS were checked HR 80, BP 108/45, O2Sat 100%. Pt had dialysis this morning and is most likely fatigued. Pt is working towards goals demonstrated decreased need for assistance with bed mobility though fatigued. Pt has assistance at home from family and currently recommending skilled physical therapy services in [place] setting on discharge from acute care setting. If pt has less help or physical abilities decline may consider SNF setting in order to decrease risk for immobility, falls and re-hospitalization.    Recommendations for follow up therapy are one component of a multi-disciplinary discharge planning process, led by the attending physician.  Recommendations may be updated based on patient status, additional functional criteria and insurance authorization.  Follow Up Recommendations  Home health PT     Assistance Recommended at Discharge Frequent or constant Supervision/Assistance  Patient can return home with the following A lot of help with walking and/or transfers;Help with stairs or ramp for entrance;Assist for transportation   Equipment Recommendations  Wheelchair (measurements PT);Wheelchair cushion (measurements PT);Rolling walker (2 wheels);BSC/3in1 (pending if pt needs W/C)    Recommendations for Other Services       Precautions / Restrictions  Precautions Precautions: Fall Restrictions Weight Bearing Restrictions: No     Mobility  Bed Mobility Overal bed mobility: Needs Assistance Bed Mobility: Supine to Sit, Sit to Supine     Supine to sit: Min assist Sit to supine: Min assist   General bed mobility comments: Pt requires Min A with initiation at bil LE in order to start bed mobility for sit <>supine. Patient Response: Flat affect  Transfers                   General transfer comment: Attempted to stand but with initiation from EOB pt was not assisting. Unsafe to continue at this time. Pt was initiated to go sitting to supine and vitals checked.    Ambulation/Gait               General Gait Details: not safe at this time due to pt current functionals status.        Balance Overall balance assessment: Needs assistance Sitting-balance support: Feet supported Sitting balance-Leahy Scale: Fair Sitting balance - Comments: seated at EOB pt sitting with flexed posture resting forearms on legs.       Standing balance comment: Unable to get to standing today due to fatigue.          Cognition Arousal/Alertness: Lethargic Behavior During Therapy: Flat affect Overall Cognitive Status: No family/caregiver present to determine baseline cognitive functioning Area of Impairment: Attention, Awareness         Following Commands: Follows one step commands inconsistently, Follows one step commands with increased time Safety/Judgement: Decreased awareness of deficits, Decreased awareness of safety     General Comments: Pt requires initiation to start tasks.           General Comments General  comments (skin integrity, edema, etc.): Pt initially agreeable to PT. Initiated sitting EOB by moving legs and pt finished movement. Once sitting EOB pt was not following instructions and only grunting as communication with multi modal cues. Pt then initiated back to supine and vitals checked; WNL. Most likely  fatigued from dialysis.      Pertinent Vitals/Pain Pain Assessment Pain Assessment: No/denies pain     PT Goals (current goals can now be found in the care plan section) Acute Rehab PT Goals Patient Stated Goal: Go home PT Goal Formulation: With patient/family Time For Goal Achievement: 06/25/22 Potential to Achieve Goals: Fair Progress towards PT goals: Progressing toward goals    Frequency    Min 3X/week      PT Plan Current plan remains appropriate       AM-PAC PT "6 Clicks" Mobility   Outcome Measure  Help needed turning from your back to your side while in a flat bed without using bedrails?: A Little Help needed moving from lying on your back to sitting on the side of a flat bed without using bedrails?: A Little Help needed moving to and from a bed to a chair (including a wheelchair)?: A Little Help needed standing up from a chair using your arms (e.g., wheelchair or bedside chair)?: A Lot Help needed to walk in hospital room?: Total Help needed climbing 3-5 steps with a railing? : Total 6 Click Score: 13    End of Session   Activity Tolerance: Patient tolerated treatment well Patient left: in bed;with bed alarm set;with call bell/phone within reach Nurse Communication: Mobility status;Other (comment) (pt lethargy) PT Visit Diagnosis: Unsteadiness on feet (R26.81);Other abnormalities of gait and mobility (R26.89);Muscle weakness (generalized) (M62.81);Difficulty in walking, not elsewhere classified (R26.2)     Time: 5188-4166 PT Time Calculation (min) (ACUTE ONLY): 17 min  Charges:  $Therapeutic Activity: 8-22 mins                     Tomma Rakers, DPT, CLT  Acute Rehabilitation Services Office: [phone] (Secure chat preferred)    Ander Purpura 06/25/2022, 4:19 PM

## 2022-06-25 NOTE — Progress Notes (Signed)
   06/25/22 1242  Vitals  Temp 98.1 F (36.7 C)  Temp Source Oral  BP (!) 117/45  MAP (mmHg) 65  BP Location Left Arm  BP Method Automatic  Patient Position (if appropriate) Lying  Pulse Rate 77  Pulse Rate Source Monitor  ECG Heart Rate 77  Resp 16  Oxygen Therapy  SpO2 96 %  O2 Device Room Air  Pulse Oximetry Type Continuous   Received patient in bed to unit.  Alert and oriented.  Informed consent signed and in chart.   Treatment initiated: 0819 Treatment completed: 1242  Patient tolerated well.  Transported back to the room  Alert, without acute distress.  Hand-off given to patient's nurse.   Access used: AVF Access issues: NA  Total UF removed: 1027m Medication(s) given: NA Post HD VS: see above Post HD weight: 62kg   [facility]

## 2022-06-26 DIAGNOSIS — J90 Pleural effusion, not elsewhere classified: Secondary | ICD-10-CM | POA: Diagnosis not present

## 2022-06-26 LAB — RENAL FUNCTION PANEL
Albumin: 2.3 g/dL — ABNORMAL LOW (ref 3.5–5.0)
Anion gap: 11 (ref 5–15)
BUN: 11 mg/dL (ref 8–23)
CO2: 27 mmol/L (ref 22–32)
Calcium: 7.4 mg/dL — ABNORMAL LOW (ref 8.9–10.3)
Chloride: 97 mmol/L — ABNORMAL LOW (ref 98–111)
Creatinine, Ser: 4.59 mg/dL — ABNORMAL HIGH (ref 0.44–1.00)
GFR, Estimated: 9 mL/min — ABNORMAL LOW (ref >60)
Glucose, Bld: 114 mg/dL — ABNORMAL HIGH (ref 70–99)
Phosphorus: 3.3 mg/dL (ref 2.5–4.6)
Potassium: 4.8 mmol/L (ref 3.5–5.1)
Sodium: 135 mmol/L (ref 135–145)

## 2022-06-26 LAB — GLUCOSE, CAPILLARY
Glucose-Capillary: 108 mg/dL — ABNORMAL HIGH (ref 70–99)
Glucose-Capillary: 102 mg/dL — ABNORMAL HIGH (ref 70–99)
Glucose-Capillary: 116 mg/dL — ABNORMAL HIGH (ref 70–99)
Glucose-Capillary: 115 mg/dL — ABNORMAL HIGH (ref 70–99)
Glucose-Capillary: 269 mg/dL — ABNORMAL HIGH (ref 70–99)
Glucose-Capillary: 250 mg/dL — ABNORMAL HIGH (ref 70–99)
Glucose-Capillary: 286 mg/dL — ABNORMAL HIGH (ref 70–99)

## 2022-06-26 LAB — CBC
HCT: 25.1 % — ABNORMAL LOW (ref 36.0–46.0)
Hemoglobin: 8.3 g/dL — ABNORMAL LOW (ref 12.0–15.0)
MCH: 31.6 pg (ref 26.0–34.0)
MCHC: 33.1 g/dL (ref 30.0–36.0)
MCV: 95.4 fL (ref 80.0–100.0)
Platelets: 182 10*3/uL (ref 150–400)
RBC: 2.63 MIL/uL — ABNORMAL LOW (ref 3.87–5.11)
RDW: 17.2 % — ABNORMAL HIGH (ref 11.5–15.5)
WBC: 12.3 10*3/uL — ABNORMAL HIGH (ref 4.0–10.5)
nRBC: 0.2 % (ref 0.0–0.2)

## 2022-06-26 MED ORDER — SODIUM CHLORIDE 0.9 % IV BOLUS
500.0000 mL | Freq: Once | INTRAVENOUS | Status: AC
  Administered 2022-06-26: 500 mL via INTRAVENOUS

## 2022-06-26 MED ORDER — MIDODRINE HCL 5 MG PO TABS
5.0000 mg | ORAL_TABLET | Freq: Three times a day (TID) | ORAL | Status: DC
  Administered 2022-06-26 – 2022-06-27 (×5): 5 mg via ORAL
  Filled 2022-06-26 (×5): qty 1

## 2022-06-26 NOTE — Progress Notes (Signed)
PROGRESS NOTE [name]  QZE:[id number] DOB: 06/20/1940 DOA: 06/09/2022 PCP: [name], MD   Brief Narrative/Hospital Course:  82 y.o. female with a history of ESRD, diabetes mellitus type 2, GERD, hypertension, stroke-. Patient presented secondary to shortness of breath and found to have recurrent pneumonia with associated left sided pleural effusion. Empiric antibiotics initiated and patient transferred to [facility] for consideration of chest tube placement. During admission, patient developed hospital delirium.  Patient was seen by nephrology continue on hemodialysis.  IR consulted underwent left 12 French pleural drain placed by IR 1/3, followed by PCCM, s/p thrombolytic instillation on 1/4. Patient continued chest tube in place.  1/7 bloody drainage noted from chest tube Chest tube removed 1/8 evening and did well since then doing well on room air.  Seen by nephrology for ongoing dialysis per schedule. Having few episodes of hypoglycemia insulin completely discontinued oral diet encouraged   Subjective: Seen and examined this morning this morning she is feeling fine reports last night she was dizzy. Patient had hypotension in 80s symptomatic with dizziness labetalol discontinued, IV fluid given 500 cc also placed on midodrine this am  Assessment and Plan: Principal Problem:   Pleural effusion Active Problems:   Essential hypertension   Thrombocytopenia (HCC)   ESRD (end stage renal disease) ([place])   DMII (diabetes mellitus, type 2) (HCC)   GERD (gastroesophageal reflux disease)   CAP (community acquired pneumonia)   Delirium   Need for management of chest tube   Hemothorax on left   Loculated pleural effusion   Acute blood loss anemia   Hemothorax   Recurrent left exudative pleural effusion Now left hemothorax after intrapleural Lytics: Effusion from multifocal pneumonia in mid December.  Overall afebrile leukocytosis resolved, on room air. Cultures negative  MRSA swab was negative.  Chest tube placed by IR 1/3, thrombolytics instilled 1/5.patient completed antibiotics.  CT 1/8 showed a small loculated pleural effusion on the left with chest tube in place with decrease in size from prior exam decreased airspace opacities in the left lung.Chest tube removed 1/8 no need for follow-up imaging unless he has respiratory issues.  No further plan per pulmonary increase activity   Acute blood loss anemia in the setting of chronic anemia: Improved after transfusion.  Monitor intermittently Recent Labs  Lab 06/21/22 1701 06/21/22 2334 06/23/22 0240 06/25/22 0851 06/26/22 0636  HGB 7.0* 9.1* 8.4* 7.7* 8.3*  HCT 21.3* 26.1* 23.9* 23.0* 25.1*   Delirium:In the setting of history of multiple vascular injuries and current hospitalization. Likely hospital related delirium.  Improved, CT head unremarkable continue Seroquel at nighttime, continue fall precautions supportive care delirium precaution.  Some baseline confusion but stable   GERD continue PPI   Type 2 diabetes mellitus with hypoglycemia : Discontinued patient's long-acting insulin given hypoglycemia encourage oral intake blood sugar improved  Recent Labs  Lab 06/25/22 2040 06/26/22 0115 06/26/22 0301 06/26/22 0510 06/26/22 0719  GLUCAP 99 108* 102* 116* 115*     ESRD on HD MWF last HD 1/10  Hypotension overnight  1/10- 1/11 History of hypertension: labetalol discontinued given IV fluids, starting midodrine.  Stable in low 100 now.  Thrombocytopenia: Improved.  Monitor Chest pain resolved likely due to hypertension Deconditioning debility weakness: Will request PT OT reevaluation , daughter is concerned that she may not able to get her in and out of a car or back into the house. asked social worker to look into whether she qualifies for skilled nursing facility.  OT reassessed  and recommending skilled nursing facility.  TOC working on SNF.  DVT prophylaxis: Place and maintain sequential  compression device Start: 06/14/22 0721 SCDs Start: 06/09/22 2218 Code Status:   Code Status: Full Code Family Communication: plan of care discussed with patient at bedside. Patient status is: inpatient  because of chest tube Level of care: Telemetry Medical   Dispo: The patient is from: home, lives with her son            Anticipated disposition: SNF once available if BP stable and no more hypoglycemia today  Objective: Vitals last 24 hrs: Vitals:   06/26/22 0521 06/26/22 0526 06/26/22 0539 06/26/22 0841  BP: (!) 96/40 (!) 110/42 (!) 116/51 (!) 95/51  Pulse:  86 86 92  Resp:    16  Temp:    98.3 F (36.8 C)  TempSrc:    Oral  SpO2:    100%  Weight:      Height:       Weight change:   Physical Examination: General exam: Aaox2-3, weak,older appearing HEENT:Oral mucosa moist, Ear/Nose WNL grossly, dentition normal. Respiratory system: bilaterally clear BS, no use of accessory muscle Cardiovascular system: S1 & S2 +, regular rate. Gastrointestinal system: Abdomen soft,NT,ND,BS+ Nervous System:Alert, awake, moving extremities and grossly nonfocal Extremities: LE ankle edema neg, lower extremities warm Skin: No rashes,no icterus. JSH:FWYOVZ muscle bulk,tone, power   Medications reviewed:  Scheduled Meds:  Chlorhexidine Gluconate Cloth  6 each Topical Q0600   Chlorhexidine Gluconate Cloth  6 each Topical Q0600   darbepoetin (ARANESP) injection - DIALYSIS  60 mcg Subcutaneous Q Mon-1800   dextromethorphan-guaiFENesin  1 tablet Oral BID   hydrocerin   Topical BID   midodrine  5 mg Oral TID WC   pantoprazole  40 mg Oral Daily   QUEtiapine  12.5 mg Oral QHS   sevelamer carbonate  800 mg Oral BID AC   Continuous Infusions:    Diet Order             Diet Heart Room service appropriate? Yes; Fluid consistency: Thin  Diet effective now                   Intake/Output Summary (Last 24 hours) at 06/26/2022 0853 Last data filed at 06/26/2022 0536 Gross per 24 hour   Intake 860 ml  Output 1000 ml  Net -140 ml    Net IO Since Admission: -9,827.4 mL [06/26/22 0853]  Wt Readings from Last 3 Encounters:  06/25/22 64.3 kg  06/04/22 73.8 kg  05/09/22 72.9 kg     Unresulted Labs (From admission, onward)    None     Data Reviewed: I have personally reviewed following labs and imaging studies CBC: Recent Labs  Lab 06/21/22 0736 06/21/22 1701 06/21/22 2334 06/23/22 0240 06/25/22 0851 06/26/22 0636  WBC 11.5* 11.4*  --  11.8* 9.5 12.3*  HGB 8.0* 7.0* 9.1* 8.4* 7.7* 8.3*  HCT 23.9* 21.3* 26.1* 23.9* 23.0* 25.1*  MCV 94.8 95.5  --  90.2 94.7 95.4  PLT 174 182  --  187 187 858    Basic Metabolic Panel: Recent Labs  Lab 06/20/22 0846 06/21/22 0736 06/25/22 0851 06/26/22 0636  NA 134* 137 134* 135  K 4.6 4.4 4.9 4.8  CL 94* 96* 93* 97*  CO2 '26 29 28 27  '$ GLUCOSE 170* 78 123* 114*  BUN 23 13 29* 11  CREATININE 5.88* 4.43* 7.63* 4.59*  CALCIUM 7.9* 7.8* 7.4* 7.4*  MG  --  1.6*  --   --  PHOS 4.0  --  3.2 3.3    GFR: Estimated Creatinine Clearance: 9.2 mL/min (A) (by C-G formula based on SCr of 4.59 mg/dL (H)). Liver Function Tests: Recent Labs  Lab 06/20/22 0846 06/25/22 0851 06/26/22 0636  ALBUMIN 2.0* 2.2* 2.3*   Coagulation Profile: Recent Labs  Lab 06/21/22 1701  INR 1.3*   CBG: Recent Labs  Lab 06/25/22 2040 06/26/22 0115 06/26/22 0301 06/26/22 0510 06/26/22 0719  GLUCAP 99 108* 102* 116* 115*    Recent Results (from the past 240 hour(s))  Aerobic/Anaerobic Culture w Gram Stain (surgical/deep wound)     Status: None   Collection Time: 06/18/22  1:51 PM   Specimen: Pleural Fluid  Result Value Ref Range Status   Specimen Description FLUID PLEURAL  Final   Special Requests NONE  Final   Gram Stain NO WBC SEEN NO ORGANISMS SEEN   Final   Culture   Final    No growth aerobically or anaerobically. Performed at [facility], [place] [address] [place], [place] [address]    Report Status 06/23/2022  FINAL  Final    Antimicrobials: Anti-infectives (From admission, onward)    Start     Dose/Rate Route Frequency Ordered Stop   06/18/22 1200  vancomycin (VANCOREADY) IVPB 750 mg/150 mL  Status:  Discontinued        750 mg 150 mL/hr over 60 Minutes Intravenous Every M-W-F (Hemodialysis) 06/14/22 1156 06/19/22 1049   06/15/22 1800  vancomycin (VANCOREADY) IVPB 750 mg/150 mL        750 mg 150 mL/hr over 60 Minutes Intravenous Once in dialysis 06/15/22 1151 06/16/22 0130   06/14/22 2200  piperacillin-tazobactam (ZOSYN) IVPB 2.25 g  Status:  Discontinued        2.25 g 100 mL/hr over 30 Minutes Intravenous Every 8 hours 06/14/22 1806 06/19/22 1049   06/14/22 1800  ceFEPIme (MAXIPIME) 1 g in sodium chloride 0.9 % 100 mL IVPB  Status:  Discontinued        1 g 200 mL/hr over 30 Minutes Intravenous Every 24 hours 06/14/22 1136 06/14/22 1803   06/14/22 1230  vancomycin (VANCOREADY) IVPB 750 mg/150 mL        750 mg 150 mL/hr over 60 Minutes Intravenous  Once 06/14/22 1142 06/14/22 1901   06/12/22 1230  vancomycin (VANCOREADY) IVPB 750 mg/150 mL        750 mg 150 mL/hr over 60 Minutes Intravenous  Once 06/12/22 1142 06/12/22 1351   06/11/22 2200  vancomycin (VANCOREADY) IVPB 750 mg/150 mL  Status:  Discontinued        750 mg 150 mL/hr over 60 Minutes Intravenous Every M-W-F (Hemodialysis) 06/11/22 1559 06/13/22 2043   06/11/22 1600  ceFEPIme (MAXIPIME) 2 g in sodium chloride 0.9 % 100 mL IVPB  Status:  Discontinued        2 g 200 mL/hr over 30 Minutes Intravenous Every M-W-F (Hemodialysis) 06/11/22 1312 06/14/22 1136   06/10/22 2300  ceFEPIme (MAXIPIME) 1 g in sodium chloride 0.9 % 100 mL IVPB  Status:  Discontinued        1 g 200 mL/hr over 30 Minutes Intravenous Every 24 hours 06/09/22 2219 06/11/22 1312   06/10/22 1800  vancomycin (VANCOREADY) IVPB 750 mg/150 mL        750 mg 150 mL/hr over 60 Minutes Intravenous Every T-Th-Sa (Hemodialysis) 06/10/22 1302 06/10/22 2030   06/09/22 2230   ceFEPIme (MAXIPIME) 2 g in sodium chloride 0.9 % 100 mL IVPB  2 g 200 mL/hr over 30 Minutes Intravenous  Once 06/09/22 2219 06/10/22 0005   06/09/22 2230  vancomycin (VANCOREADY) IVPB 1750 mg/350 mL        1,750 mg 175 mL/hr over 120 Minutes Intravenous  Once 06/09/22 2219 06/10/22 0327   06/09/22 2219  vancomycin variable dose per unstable renal function (pharmacist dosing)  Status:  Discontinued         Does not apply See admin instructions 06/09/22 2219 06/19/22 1049      Culture/Microbiology    Component Value Date/Time   SDES FLUID PLEURAL 06/18/2022 1351   SPECREQUEST NONE 06/18/2022 1351   CULT  06/18/2022 1351    No growth aerobically or anaerobically. Performed at [facility], [place] [address], [place], [place] [address]    REPTSTATUS 06/23/2022 FINAL 06/18/2022 1351  Radiology Studies: No results found.   LOS: 17 days   Antonieta Pert, MD [facility]  06/26/2022, 8:53 AM

## 2022-06-26 NOTE — Progress Notes (Signed)
Patient BP dropped to 80/40s,manually. Patient interactive and alert. Patient stated she did not feel good. Contacted Dr. Myna Hidalgo for orders. Orders for labs and 500 cc bolus in place.

## 2022-06-26 NOTE — Plan of Care (Signed)
Patient has been drowsy but easily aroused. Patient is alert to self only. Assistance provided with ADLs as needed. Family is bedside. Patient has decreased appetite. Per family, she ate one sweet potato for lunch. Patient reports discomfort in buttocks while lying in bed. Repositioning patient seemed to help relieve discomfort. Patient refused PRN pain medication. Call bell within reach. Bed in lowest position. Will continue to monitor.   Problem: Activity: Goal: Ability to tolerate increased activity will improve Outcome: Progressing   Problem: Clinical Measurements: Goal: Ability to maintain a body temperature in the normal range will improve Outcome: Progressing   Problem: Respiratory: Goal: Ability to maintain adequate ventilation will improve Outcome: Progressing Goal: Ability to maintain a clear airway will improve Outcome: Progressing   Problem: Education: Goal: Knowledge of General Education information will improve Description: Including pain rating scale, medication(s)/side effects and non-pharmacologic comfort measures Outcome: Progressing   Problem: Health Behavior/Discharge Planning: Goal: Ability to manage health-related needs will improve Outcome: Progressing   Problem: Clinical Measurements: Goal: Ability to maintain clinical measurements within normal limits will improve Outcome: Progressing Goal: Will remain free from infection Outcome: Progressing Goal: Diagnostic test results will improve Outcome: Progressing Goal: Respiratory complications will improve Outcome: Progressing Goal: Cardiovascular complication will be avoided Outcome: Progressing   Problem: Activity: Goal: Risk for activity intolerance will decrease Outcome: Progressing   Problem: Nutrition: Goal: Adequate nutrition will be maintained Outcome: Progressing   Problem: Coping: Goal: Level of anxiety will decrease Outcome: Progressing   Problem: Elimination: Goal: Will not experience  complications related to bowel motility Outcome: Progressing Goal: Will not experience complications related to urinary retention Outcome: Progressing   Problem: Pain Managment: Goal: General experience of comfort will improve Outcome: Progressing   Problem: Safety: Goal: Ability to remain free from injury will improve Outcome: Progressing   Problem: Skin Integrity: Goal: Risk for impaired skin integrity will decrease Outcome: Progressing   Problem: Education: Goal: Ability to describe self-care measures that may prevent or decrease complications (Diabetes Survival Skills Education) will improve Outcome: Progressing Goal: Individualized Educational Video(s) Outcome: Progressing   Problem: Coping: Goal: Ability to adjust to condition or change in health will improve Outcome: Progressing   Problem: Fluid Volume: Goal: Ability to maintain a balanced intake and output will improve Outcome: Progressing   Problem: Health Behavior/Discharge Planning: Goal: Ability to identify and utilize available resources and services will improve Outcome: Progressing Goal: Ability to manage health-related needs will improve Outcome: Progressing   Problem: Metabolic: Goal: Ability to maintain appropriate glucose levels will improve Outcome: Progressing   Problem: Nutritional: Goal: Maintenance of adequate nutrition will improve Outcome: Progressing Goal: Progress toward achieving an optimal weight will improve Outcome: Progressing   Problem: Skin Integrity: Goal: Risk for impaired skin integrity will decrease Outcome: Progressing   Problem: Tissue Perfusion: Goal: Adequacy of tissue perfusion will improve Outcome: Progressing

## 2022-06-26 NOTE — Progress Notes (Signed)
Patient hypotensive this am with manual BP of 80/40.   Plan to give 500 cc NS bolus and check CBC and chem panel.

## 2022-06-26 NOTE — Progress Notes (Signed)
Patient ID: [name], female   DOB: 1940-07-29, 82 y.o.   MRN: [id number] [facility] Progress Note   Assessment/ Plan:   1.  Acute hypoxic respiratory failure: Secondary to loculated left-sided pleural effusion status post pleural drain on 06/18/2022 allowed by thrombolytic therapy on 1/5.  She thereafter developed a hemothorax from intrapleural thrombolytics.  Chest tube with follow-up imaging only recommended if she develops worsening symptoms. 2. ESRD: She underwent hemodialysis with mild intradialytic hypotension yesterday and overnight has not had any problems with breathing, chest pain or blood pressures.  She was hypotensive earlier today but appears to be asymptomatic at her current blood pressure.  Placed on schedule for dialysis tomorrow in case she is not able to go to a SNF today. 3. Anemia: Secondary to losses associated with hemothorax as well as underlying chronic kidney disease/ESRD.  Continue ESA. 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 5. Nutrition: Continue current renal diet with oral nutritional/protein supplementation. 6. Hypotension: Symptomatic earlier prompting need for saline bolus and initiation of midodrine along with discontinuation of labetalol.  Subjective:   She had some symptomatic hypotension earlier today prompting 500 cc saline bolus, discontinuation of labetalol and initiation of midodrine.  Currently reports to be feeling better and awaiting assistance to go to bathroom.   Objective:   BP (!) 95/51 (BP Location: Left Arm)   Pulse 92   Temp 98.3 F (36.8 C) (Oral)   Resp 16   Ht '5\' 7"'$  (1.702 m)   Wt 64.3 kg   SpO2 100%   BMI 22.20 kg/m   Physical Exam: Gen: Resting comfortably in bed without oxygen supplementation.  Alert/oriented CVS: Pulse regular rhythm, normal rate, S1 and S2 normal Resp: Decreased inspiratory effort with diminished breath sounds at the bases otherwise  clear to auscultation Abd: Soft, flat, nontender Ext: No lower extremity edema.  Right upper arm AV fistula is slightly pulsatile  Labs: BMET Recent Labs  Lab 06/20/22 0846 06/21/22 0736 06/25/22 0851 06/26/22 0636  NA 134* 137 134* 135  K 4.6 4.4 4.9 4.8  CL 94* 96* 93* 97*  CO2 '26 29 28 27  '$ GLUCOSE 170* 78 123* 114*  BUN 23 13 29* 11  CREATININE 5.88* 4.43* 7.63* 4.59*  CALCIUM 7.9* 7.8* 7.4* 7.4*  PHOS 4.0  --  3.2 3.3   CBC Recent Labs  Lab 06/21/22 1701 06/21/22 2334 06/23/22 0240 06/25/22 0851 06/26/22 0636  WBC 11.4*  --  11.8* 9.5 12.3*  HGB 7.0* 9.1* 8.4* 7.7* 8.3*  HCT 21.3* 26.1* 23.9* 23.0* 25.1*  MCV 95.5  --  90.2 94.7 95.4  PLT 182  --  187 187 182      Medications:     Chlorhexidine Gluconate Cloth  6 each Topical Q0600   Chlorhexidine Gluconate Cloth  6 each Topical Q0600   darbepoetin (ARANESP) injection - DIALYSIS  60 mcg Subcutaneous Q Mon-1800   dextromethorphan-guaiFENesin  1 tablet Oral BID   hydrocerin   Topical BID   midodrine  5 mg Oral TID WC   pantoprazole  40 mg Oral Daily   QUEtiapine  12.5 mg Oral QHS   sevelamer carbonate  800 mg Oral BID AC   Elmarie Shiley, MD 06/26/2022, 9:23 AM

## 2022-06-27 DIAGNOSIS — J90 Pleural effusion, not elsewhere classified: Secondary | ICD-10-CM | POA: Diagnosis not present

## 2022-06-27 LAB — RENAL FUNCTION PANEL
Albumin: 2.1 g/dL — ABNORMAL LOW (ref 3.5–5.0)
Anion gap: 11 (ref 5–15)
BUN: 20 mg/dL (ref 8–23)
CO2: 27 mmol/L (ref 22–32)
Calcium: 7.2 mg/dL — ABNORMAL LOW (ref 8.9–10.3)
Chloride: 97 mmol/L — ABNORMAL LOW (ref 98–111)
Creatinine, Ser: 6.52 mg/dL — ABNORMAL HIGH (ref 0.44–1.00)
GFR, Estimated: 6 mL/min — ABNORMAL LOW (ref >60)
Glucose, Bld: 206 mg/dL — ABNORMAL HIGH (ref 70–99)
Phosphorus: 3.3 mg/dL (ref 2.5–4.6)
Potassium: 4.9 mmol/L (ref 3.5–5.1)
Sodium: 135 mmol/L (ref 135–145)

## 2022-06-27 LAB — CBC
HCT: 24 % — ABNORMAL LOW (ref 36.0–46.0)
Hemoglobin: 8 g/dL — ABNORMAL LOW (ref 12.0–15.0)
MCH: 31.3 pg (ref 26.0–34.0)
MCHC: 33.3 g/dL (ref 30.0–36.0)
MCV: 93.8 fL (ref 80.0–100.0)
Platelets: 167 10*3/uL (ref 150–400)
RBC: 2.56 MIL/uL — ABNORMAL LOW (ref 3.87–5.11)
RDW: 16.7 % — ABNORMAL HIGH (ref 11.5–15.5)
WBC: 11.4 10*3/uL — ABNORMAL HIGH (ref 4.0–10.5)
nRBC: 0 % (ref 0.0–0.2)

## 2022-06-27 LAB — GLUCOSE, CAPILLARY
Glucose-Capillary: 119 mg/dL — ABNORMAL HIGH (ref 70–99)
Glucose-Capillary: 185 mg/dL — ABNORMAL HIGH (ref 70–99)
Glucose-Capillary: 182 mg/dL — ABNORMAL HIGH (ref 70–99)

## 2022-06-27 MED ORDER — INSULIN ASPART PROT & ASPART (70-30 MIX) 100 UNIT/ML ~~LOC~~ SUSP
3.0000 [IU] | Freq: Two times a day (BID) | SUBCUTANEOUS | Status: DC
  Filled 2022-06-27: qty 10

## 2022-06-27 MED ORDER — HEPARIN SODIUM (PORCINE) 1000 UNIT/ML DIALYSIS
40.0000 [IU]/kg | INTRAMUSCULAR | Status: DC | PRN
  Administered 2022-06-27: 2600 [IU] via INTRAVENOUS_CENTRAL
  Filled 2022-06-27: qty 3

## 2022-06-27 MED ORDER — INSULIN ASPART PROT & ASPART (70-30 MIX) 100 UNIT/ML ~~LOC~~ SUSP: 5.0000 [IU] | Freq: Two times a day (BID) | SUBCUTANEOUS | Status: DC

## 2022-06-27 MED ORDER — INSULIN ASPART PROT & ASPART (70-30 MIX) 100 UNIT/ML ~~LOC~~ SUSP
3.0000 [IU] | Freq: Two times a day (BID) | SUBCUTANEOUS | Status: DC
  Administered 2022-06-27 – 2022-07-01 (×8): 3 [IU] via SUBCUTANEOUS
  Filled 2022-06-27: qty 10

## 2022-06-27 NOTE — TOC Progression Note (Signed)
Transition of Care [facility]) - Initial/Assessment Note    Patient Details  Name: [name] MRN: [id number] Date of Birth: 03/05/1941  Transition of Care [facility]) CM/SW Contact:    Milinda Antis, LCSWA Phone Number: 06/27/2022, 1:34 PM  Clinical Narrative:                 LCSW presented bed offers to the patient's daughter.  The daughter choose [facility].  LCSW contacted the facility, verified bed availability, and requested insurance auth.  Pending insurance auth.    Expected Discharge Plan: Home/Self Care Barriers to Discharge: Continued Medical Work up   Patient Goals and CMS Choice Patient states their goals for this hospitalization and ongoing recovery are:: return home CMS Medicare.gov Compare Post Acute Care list provided to:: Patient Represenative (must comment) Choice offered to / list presented to : Adult Children      Expected Discharge Plan and Services In-house Referral: Clinical Social Work     Living arrangements for the past 2 months: Single Family Home Expected Discharge Date: 06/12/22                         HH Arranged: PT   Date [place]: 06/12/22      Prior Living Arrangements/Services Living arrangements for the past 2 months: Single Family Home Lives with:: Adult Children Patient language and need for interpreter reviewed:: Yes Do you feel safe going back to the place where you live?: Yes      Need for Family Participation in Patient Care: Yes (Comment) Care giver support system in place?: Yes (comment) Current home services: DME Criminal Activity/Legal Involvement Pertinent to Current Situation/Hospitalization: No - Comment as needed  Activities of Daily Living Home Assistive Devices/Equipment: Walker (specify type) ADL Screening (condition at time of admission) Patient's cognitive ability adequate to safely complete daily activities?: Yes Is the patient deaf or have difficulty hearing?: Yes ([place]) Does the patient  have difficulty seeing, even when wearing glasses/contacts?: No Does the patient have difficulty concentrating, remembering, or making decisions?: No Patient able to express need for assistance with ADLs?: Yes Does the patient have difficulty dressing or bathing?: No Independently performs ADLs?: Yes (appropriate for developmental age) Does the patient have difficulty walking or climbing stairs?: Yes Weakness of Legs: Both Weakness of Arms/Hands: None  Permission Sought/Granted                  Emotional Assessment       Orientation: : Oriented to Self, Oriented to Place, Oriented to  Time, Oriented to Situation Alcohol / Substance Use: Not Applicable Psych Involvement: No (comment)  Admission diagnosis:  SOB (shortness of breath) [R06.02] Pleural effusion [J90] Pneumonia due to infectious organism, unspecified laterality, unspecified part of lung [J18.9] Patient Active Problem List   Diagnosis Date Noted   Acute blood loss anemia 06/22/2022   Hemothorax 06/22/2022   Hemothorax on left 06/21/2022   Loculated pleural effusion 06/21/2022   Need for management of chest tube 06/20/2022   Delirium 06/14/2022   GERD (gastroesophageal reflux disease) 06/10/2022   CAP (community acquired pneumonia) 06/10/2022   ESRD (end stage renal disease) ([place]) 06/09/2022   DMII (diabetes mellitus, type 2) ([place]) 06/09/2022   Multifocal pneumonia 06/02/2022   Pleural effusion 06/02/2022   End stage renal disease on dialysis ([place]) 06/02/2022   Spinal stenosis of lumbar region 01/07/2022   Schatzki's ring 10/15/2021   Esophageal dysphagia 10/15/2021   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  Hypertensive heart and CKD, ESRD on dialysis ([place]) 11/09/2020   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 due to type 2 diabetes mellitus ([place]) 11/09/2020   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 diabetes mellitus ([place]) 11/09/2020   Dependence on renal dialysis ([place])  11/09/2020   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 disease ([place]) 11/09/2020   Hyperlipidemia associated with type 2 diabetes mellitus ([place]) 11/09/2020   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 ([place]) 11/09/2020   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 ([place]) 11/09/2020   Chronic constipation 11/09/2020   Vitamin B 12 deficiency 11/09/2020   Acute respiratory failure with hypoxia ([place]) 10/26/2020   Elevated troponin 10/26/2020   Volume overload 10/26/2020   Hypertensive urgency 10/26/2020   History of CVA (cerebrovascular accident) 01/05/2020   Thrombocytopenia ([place]) 01/05/2020   History of colonic polyps 04/01/2018   Acute bronchitis 10/15/2017   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 ([place])    HCAP (healthcare-associated pneumonia) 05/29/2015   Hyperkalemia 05/29/2015   ESRD (end stage renal disease) on dialysis ([place])    Gastro-esophageal reflux disease without esophagitis 09/29/2014   Encounter for adequacy testing for hemodialysis ([place]) 01/17/2014   Mechanical complication of other vascular device, implant, and graft 01/17/2014   Disturbances of vision, late effect of stroke 11/03/2013   Right hemiparesis ([place]) 08/19/2013   CVA (cerebral infarction) 07/20/2013   Gastroparesis due to DM ([place]) 07/01/2013   Type 2 diabetes mellitus with hyperglycemia ([place]) 06/23/2013   Fall 05/25/2012   HLD (hyperlipidemia) 05/20/2006   Hereditary and idiopathic peripheral neuropathy 05/20/2006   Essential hypertension 05/20/2006   PCP:  [name], MD Pharmacy:  No Pharmacies Listed    Social Determinants of Health (SDOH) Social History: SDOH Screenings   Food Insecurity: No Food Insecurity (06/13/2022)  Housing: Low Risk  (06/13/2022)  Transportation Needs: No Transportation Needs (06/13/2022)  Utilities: Not At Risk (06/13/2022)  Tobacco Use: Low Risk  (06/10/2022)   SDOH Interventions:     Readmission Risk  Interventions    06/12/2022    3:57 PM 06/10/2022   11:01 AM 01/17/2020    9:17 AM  Readmission Risk Prevention Plan  Transportation Screening Complete Complete Complete  PCP or Specialist Appt within 5-7 Days   Not Complete  Not Complete comments   pending disposition  Home Care Screening   Complete  Medication Review (RN CM)   Referral to Pharmacy  [place] or [place] Complete Complete   Social Work Consult for [place] Planning/Counseling Complete Complete   Palliative Care Screening Not Applicable Not Applicable   Medication Review Press photographer) Complete Complete

## 2022-06-27 NOTE — Procedures (Signed)
Patient seen on Hemodialysis. BP (!) 136/53 (BP Location: Left Arm)   Pulse 88   Temp 98.2 F (36.8 C) (Oral)   Resp 18   Ht '5\' 7"'$  (1.702 m)   Wt 64.3 kg   SpO2 99%   BMI 22.20 kg/m   QB 400, UF goal 0.5L Tolerating treatment without complaints at this time.   Elmarie Shiley MD [facility]. Office # [phone] Pager # [phone] 9:13 AM

## 2022-06-27 NOTE — Progress Notes (Signed)
PROGRESS NOTE RIN GORTON  SFK:[id number] DOB: 03/19/1941 DOA: 06/09/2022 PCP: [name], MD   Brief Narrative/Hospital Course:  82 y.o. female with a history of ESRD, diabetes mellitus type 2, GERD, hypertension, stroke-. Patient presented secondary to shortness of breath and found to have recurrent pneumonia with associated left sided pleural effusion. Empiric antibiotics initiated and patient transferred to [facility]: [facility] for consideration of chest tube placement. During admission, patient developed hospital delirium.  Patient was seen by nephrology continue on hemodialysis.  IR consulted underwent left 12 French pleural drain placed by IR 1/3, followed by PCCM, s/p thrombolytic instillation on 1/4. Patient continued chest tube in place.  1/7 bloody drainage noted from chest tube Chest tube removed 1/8 evening and did well since then doing well on room air.  Seen by nephrology for ongoing dialysis per schedule. Having few episodes of hypoglycemia insulin completely discontinued oral diet encouraged oral intake.  Underwent dialysis 1/10 BP soft> added midodrine.  Waiting for placement.  Subjective: Seen and examined in HD Doing well. BP up and sugar up Alert awake oriented to self place people  Assessment and Plan: Principal Problem:   Pleural effusion Active Problems:   Essential hypertension   Thrombocytopenia (HCC)   ESRD (end stage renal disease) ([place])   DMII (diabetes mellitus, type 2) (HCC)   GERD (gastroesophageal reflux disease)   CAP (community acquired pneumonia)   Delirium   Need for management of chest tube   Hemothorax on left   Loculated pleural effusion   Acute blood loss anemia   Hemothorax  Recurrent left exudative pleural effusion Now left hemothorax after intrapleural Lytics: Effusion from multifocal pneumonia in mid December.Overall afebrile leukocytosis resolved, on room air. Cultures negative MRSA swab was negative.Chest tube placed by IR  1/3,thrombolytics instilled 1/5.patient completed antibiotics.CT 1/8 showed a small loculated pleural effusion on the left with chest tube in place with decrease in size from prior exam decreased airspace opacities in the left lung.Chest tube removed 1/8 no need for follow-up imaging unless he has respiratory issues.  No further plan per pulmonary increase activity   Acute blood loss anemia in the setting of chronic anemia: Improved after transfusion. Monitor HH Recent Labs  Lab 06/21/22 2334 06/23/22 0240 06/25/22 0851 06/26/22 0636 06/27/22 0754  HGB 9.1* 8.4* 7.7* 8.3* 8.0*  HCT 26.1* 23.9* 23.0* 25.1* 24.0*   Delirium:In the setting of history of multiple vascular injuries and current hospitalization. Likely hospital related delirium.  Improved, CT head unremarkable continue Seroquel at nighttime, continue fall precautions supportive care delirium precaution.  Some baseline confusion but stable mental status overall.   GERD:continue PPI   T2DM with hypoglycemia:Blood sugars are creeping up we will resume mix 70/30 insulin units twice daily and monitor CBG today. Recent Labs  Lab 06/26/22 0510 06/26/22 0719 06/26/22 1127 06/26/22 1705 06/26/22 2056  GLUCAP 116* 115* 269* 250* 286*     ESRD on HD MWF ON hd 1/12  Hypotension overnight  1/10- 1/11 History of hypertension: labetalol discontinued given IV fluids, stared on Midodrine.  BP has improved.   Thrombocytopenia: Improved.  Monitor Chest pain resolved likely due to hypertension Deconditioning debility weakness: Will request PT OT reevaluation , daughter is concerned that she may not able to get her in and out of a car or back into the house. asked social worker to look into whether she qualifies for skilled nursing facility.  OT reassessed and recommending skilled nursing facility.  TOC working on SNF.  DVT prophylaxis:  Place and maintain sequential compression device Start: 06/14/22 0721 SCDs Start: 06/09/22 2218 Code  Status:   Code Status: Full Code Family Communication: plan of care discussed with patient at bedside.  I had updated patient's daughter at the bedside 1/11  Patient status is: inpatient waiting on placement Level of care: Telemetry Medical   Dispo: The patient is from: home, lives with her son            Anticipated disposition: SNF once available likley over the weekend  Objective: Vitals last 24 hrs: Vitals:   06/27/22 1030 06/27/22 1100 06/27/22 1120 06/27/22 1127  BP: 94/75 (!) 111/45 (!) 114/45 (!) 146/51  Pulse: 89 89 89 92  Resp: '16 18 18 18  '$ Temp:      TempSrc:      SpO2: 100% 100% 100% 97%  Weight:      Height:       Weight change:   Physical Examination: General exam: AA oriented to self/current place not to president, weak,older appearing HEENT:Oral mucosa moist, Ear/Nose WNL grossly, dentition normal. Respiratory system: bilaterally clear BS,no use of accessory muscle Cardiovascular system: S1 & S2 +, regular rate. Gastrointestinal system: Abdomen soft,NT,ND,BS+ Nervous System:Alert, awake, moving extremities and grossly nonfocal Extremities:LE ankle edema neg, lower extremities warm Skin:No rashes,no icterus. QIW:LNLGXQ muscle bulk,tone, power    Medications reviewed:  Scheduled Meds:  Chlorhexidine Gluconate Cloth  6 each Topical Q0600   Chlorhexidine Gluconate Cloth  6 each Topical Q0600   darbepoetin (ARANESP) injection - DIALYSIS  60 mcg Subcutaneous Q Mon-1800   dextromethorphan-guaiFENesin  1 tablet Oral BID   hydrocerin   Topical BID   insulin aspart protamine- aspart  5 Units Subcutaneous BID WC   midodrine  5 mg Oral TID WC   pantoprazole  40 mg Oral Daily   QUEtiapine  12.5 mg Oral QHS   sevelamer carbonate  800 mg Oral BID AC  Continuous Infusions:   Diet Order             Diet Heart Room service appropriate? Yes; Fluid consistency: Thin  Diet effective now                   Intake/Output Summary (Last 24 hours) at 06/27/2022  1130 Last data filed at 06/26/2022 1300 Gross per 24 hour  Intake 220 ml  Output --  Net 220 ml    Net IO Since Admission: -9,487.4 mL [06/27/22 1130]  Wt Readings from Last 3 Encounters:  06/25/22 64.3 kg  06/04/22 73.8 kg  05/09/22 72.9 kg     Unresulted Labs (From admission, onward)     Start     Ordered   06/27/22 0928  Renal function panel  Once,   R       Question:  Specimen collection method  Answer:  Lab=Lab collect   06/27/22 0756          Data Reviewed: I have personally reviewed following labs and imaging studies CBC: Recent Labs  Lab 06/21/22 1701 06/21/22 2334 06/23/22 0240 06/25/22 0851 06/26/22 0636 06/27/22 0754  WBC 11.4*  --  11.8* 9.5 12.3* 11.4*  HGB 7.0* 9.1* 8.4* 7.7* 8.3* 8.0*  HCT 21.3* 26.1* 23.9* 23.0* 25.1* 24.0*  MCV 95.5  --  90.2 94.7 95.4 93.8  PLT 182  --  187 187 182 119    Basic Metabolic Panel: Recent Labs  Lab 06/21/22 0736 06/25/22 0851 06/26/22 0636 06/27/22 0754  NA 137 134* 135 135  K 4.4  4.9 4.8 4.9  CL 96* 93* 97* 97*  CO2 '29 28 27 27  '$ GLUCOSE 78 123* 114* 206*  BUN 13 29* 11 20  CREATININE 4.43* 7.63* 4.59* 6.52*  CALCIUM 7.8* 7.4* 7.4* 7.2*  MG 1.6*  --   --   --   PHOS  --  3.2 3.3 3.3   GFR: Estimated Creatinine Clearance: 6.5 mL/min (A) (by C-G formula based on SCr of 6.52 mg/dL (H)). Liver Function Tests: Recent Labs  Lab 06/25/22 0851 06/26/22 0636 06/27/22 0754  ALBUMIN 2.2* 2.3* 2.1*   Coagulation Profile: Recent Labs  Lab 06/21/22 1701  INR 1.3*   CBG: Recent Labs  Lab 06/26/22 0510 06/26/22 0719 06/26/22 1127 06/26/22 1705 06/26/22 2056  GLUCAP 116* 115* 269* 250* 286*    Recent Results (from the past 240 hour(s))  Aerobic/Anaerobic Culture w Gram Stain (surgical/deep wound)     Status: None   Collection Time: 06/18/22  1:51 PM   Specimen: Pleural Fluid  Result Value Ref Range Status   Specimen Description FLUID PLEURAL  Final   Special Requests NONE  Final   Gram Stain  NO WBC SEEN NO ORGANISMS SEEN   Final   Culture   Final    No growth aerobically or anaerobically. Performed at [facility], [place] [address], [place], [place] [address]    Report Status 06/23/2022 FINAL  Final    Antimicrobials: Anti-infectives (From admission, onward)    Start     Dose/Rate Route Frequency Ordered Stop   06/18/22 1200  vancomycin (VANCOREADY) IVPB 750 mg/150 mL  Status:  Discontinued        750 mg 150 mL/hr over 60 Minutes Intravenous Every M-W-F (Hemodialysis) 06/14/22 1156 06/19/22 1049   06/15/22 1800  vancomycin (VANCOREADY) IVPB 750 mg/150 mL        750 mg 150 mL/hr over 60 Minutes Intravenous Once in dialysis 06/15/22 1151 06/16/22 0130   06/14/22 2200  piperacillin-tazobactam (ZOSYN) IVPB 2.25 g  Status:  Discontinued        2.25 g 100 mL/hr over 30 Minutes Intravenous Every 8 hours 06/14/22 1806 06/19/22 1049   06/14/22 1800  ceFEPIme (MAXIPIME) 1 g in sodium chloride 0.9 % 100 mL IVPB  Status:  Discontinued        1 g 200 mL/hr over 30 Minutes Intravenous Every 24 hours 06/14/22 1136 06/14/22 1803   06/14/22 1230  vancomycin (VANCOREADY) IVPB 750 mg/150 mL        750 mg 150 mL/hr over 60 Minutes Intravenous  Once 06/14/22 1142 06/14/22 1901   06/12/22 1230  vancomycin (VANCOREADY) IVPB 750 mg/150 mL        750 mg 150 mL/hr over 60 Minutes Intravenous  Once 06/12/22 1142 06/12/22 1351   06/11/22 2200  vancomycin (VANCOREADY) IVPB 750 mg/150 mL  Status:  Discontinued        750 mg 150 mL/hr over 60 Minutes Intravenous Every M-W-F (Hemodialysis) 06/11/22 1559 06/13/22 2043   06/11/22 1600  ceFEPIme (MAXIPIME) 2 g in sodium chloride 0.9 % 100 mL IVPB  Status:  Discontinued        2 g 200 mL/hr over 30 Minutes Intravenous Every M-W-F (Hemodialysis) 06/11/22 1312 06/14/22 1136   06/10/22 2300  ceFEPIme (MAXIPIME) 1 g in sodium chloride 0.9 % 100 mL IVPB  Status:  Discontinued        1 g 200 mL/hr over 30 Minutes Intravenous Every 24 hours 06/09/22  2219 06/11/22 1312  06/10/22 1800  vancomycin (VANCOREADY) IVPB 750 mg/150 mL        750 mg 150 mL/hr over 60 Minutes Intravenous Every T-Th-Sa (Hemodialysis) 06/10/22 1302 06/10/22 2030   06/09/22 2230  ceFEPIme (MAXIPIME) 2 g in sodium chloride 0.9 % 100 mL IVPB        2 g 200 mL/hr over 30 Minutes Intravenous  Once 06/09/22 2219 06/10/22 0005   06/09/22 2230  vancomycin (VANCOREADY) IVPB 1750 mg/350 mL        1,750 mg 175 mL/hr over 120 Minutes Intravenous  Once 06/09/22 2219 06/10/22 0327   06/09/22 2219  vancomycin variable dose per unstable renal function (pharmacist dosing)  Status:  Discontinued         Does not apply See admin instructions 06/09/22 2219 06/19/22 1049     Culture/Microbiology    Component Value Date/Time   SDES FLUID PLEURAL 06/18/2022 1351   SPECREQUEST NONE 06/18/2022 1351   CULT  06/18/2022 1351    No growth aerobically or anaerobically. Performed at [facility], [place] [address], [place], [place] [address]    REPTSTATUS 06/23/2022 FINAL 06/18/2022 1351  Radiology Studies: No results found.   LOS: 18 days   Antonieta Pert, MD [facility]  06/27/2022, 11:30 AM

## 2022-06-27 NOTE — Progress Notes (Signed)
PT Cancellation Note  Patient Details Name: [name] MRN: [id number] DOB: 02-25-1941   Cancelled Treatment:    Reason Eval/Treat Not Completed: Patient declined, no reason specified Attempted further assessment this afternoon. Pt becomes increasingly agitated with therapist's requests. Attempted to re-orient several times, as she is very confused. Not agreeable to work with therapy today, asks to be left alone. Will follow and work with pt as tolerated.   Candie Mile, PT, DPT Physical Therapist Acute Rehabilitation [facility] [place] [facility] 06/27/2022, 3:20 PM

## 2022-06-27 NOTE — Progress Notes (Signed)
Occupational Therapy Treatment Patient Details Name: [name] MRN: [id number] DOB: 04/13/1941 Today's Date: 06/27/2022   Pt currently in HD.  Will recheck on pt as able Kari Baars, [place]  Office614 [phone], Thereasa Parkin 06/27/2022, 12:00 PM

## 2022-06-27 NOTE — Progress Notes (Signed)
Received patient in bed to unit.  Alert and oriented.  Informed consent signed and in chart.   Treatment initiated: 08:00 Treatment completed: 11:20  Patient tolerated well.  Transported back to the room  Alert, without acute distress.  Hand-off given to patient's nurse.   Access used: right AVF Access issues: none Total UF removed: 0.5 Medication(s) given: none    06/27/22 1120  Vitals  Temp 98.3 F (36.8 C)  BP (!) 114/45  MAP (mmHg) (!) 64  BP Location Left Arm  BP Method Automatic  Patient Position (if appropriate) Lying  Pulse Rate 89  Pulse Rate Source Monitor  ECG Heart Rate 89  Resp 18  Oxygen Therapy  SpO2 100 %  O2 Device Room Air  Patient Activity (if Appropriate) In bed  During Treatment Monitoring  Intra-Hemodialysis Comments Tx completed;Tolerated well  Fistula / Graft Right Upper arm Arteriovenous fistula  Placement Date/Time: 01/26/14 0810   Orientation: Right  Access Location: Upper arm  Access Type: Arteriovenous fistula  Status Deaccessed;Flushed      Jemarion Roycroft S [facility]

## 2022-06-28 DIAGNOSIS — J90 Pleural effusion, not elsewhere classified: Secondary | ICD-10-CM | POA: Diagnosis not present

## 2022-06-28 LAB — GLUCOSE, CAPILLARY
Glucose-Capillary: 175 mg/dL — ABNORMAL HIGH (ref 70–99)
Glucose-Capillary: 173 mg/dL — ABNORMAL HIGH (ref 70–99)
Glucose-Capillary: 172 mg/dL — ABNORMAL HIGH (ref 70–99)
Glucose-Capillary: 177 mg/dL — ABNORMAL HIGH (ref 70–99)

## 2022-06-28 MED ORDER — MIDODRINE HCL 5 MG PO TABS
2.5000 mg | ORAL_TABLET | Freq: Three times a day (TID) | ORAL | Status: DC
  Administered 2022-06-28 (×3): 2.5 mg via ORAL
  Filled 2022-06-28 (×3): qty 1

## 2022-06-28 NOTE — Progress Notes (Signed)
PROGRESS NOTE [name]  WJX:[id number] DOB: 1940-11-06 DOA: 06/09/2022 PCP: [name], MD   Brief Narrative/Hospital Course:  82 y.o. female with a history of ESRD, diabetes mellitus type 2, GERD, hypertension, stroke-. Patient presented secondary to shortness of breath and found to have recurrent pneumonia with associated left sided pleural effusion. Empiric antibiotics initiated and patient transferred to [facility] for consideration of chest tube placement. During admission, patient developed hospital delirium.  Patient was seen by nephrology continue on hemodialysis.  IR consulted underwent left 12 French pleural drain placed by IR 1/3, followed by PCCM, s/p thrombolytic instillation on 1/4. Patient continued chest tube in place.  1/7 bloody drainage noted from chest tube Chest tube removed 1/8 evening and did well since then doing well on room air.  Seen by nephrology for ongoing dialysis per schedule. Having few episodes of hypoglycemia insulin completely discontinued oral diet encouraged oral intake.  Underwent dialysis 1/10 BP soft> added midodrine.  Waiting for placement. Had HD 06/27/22  Subjective: Patient seen and examined this morning he is alert awake pleasant no complaint Overnight afebrile BP has been stable 130s midodrine dose decreased further  Assessment and Plan: Principal Problem:   Pleural effusion Active Problems:   Essential hypertension   Thrombocytopenia (HCC)   ESRD (end stage renal disease) (HCC)   DMII (diabetes mellitus, type 2) (HCC)   GERD (gastroesophageal reflux disease)   CAP (community acquired pneumonia)   Delirium   Need for management of chest tube   Hemothorax on left   Loculated pleural effusion   Acute blood loss anemia   Hemothorax  Recurrent left exudative pleural effusion Now left hemothorax after intrapleural Lytics: Effusion from multifocal pneumonia in mid December.Overall afebrile leukocytosis resolved, on room  air. Cultures negative MRSA swab was negative.Chest tube placed by IR 1/3,thrombolytics instilled 1/5.patient completed antibiotics.CT 1/8 showed a small loculated pleural effusion on the left with chest tube in place with decrease in size>Chest tube removed 1/8> and has been stable since on room air.    Acute blood loss anemia in the setting of chronic anemia: Improved after transfusion. Monitor HH Recent Labs  Lab 06/21/22 2334 06/23/22 0240 06/25/22 0851 06/26/22 0636 06/27/22 0754  HGB 9.1* 8.4* 7.7* 8.3* 8.0*  HCT 26.1* 23.9* 23.0* 25.1* 24.0*   Delirium:In the setting of history of multiple vascular injuries and current hospitalization. Likely hospital related delirium.  Improved, CT head unremarkable continue Seroquel at nighttime, continue fall precautions supportive care delirium precaution.  She is alert and oriented x 2 at baseline    GERD:continue PPI   T2DM with hypoglycemia:Blood sugars fairly stable after resuming 70/30 insulin units twice daily at low-dose, on SSI.   Recent Labs  Lab 06/26/22 2056 06/27/22 1230 06/27/22 1710 06/27/22 2026 06/28/22 0721  GLUCAP 286* 119* 185* 182* 175*     ESRD on HD MWF ON hd 1/12  Hypotension overnight  1/10- 1/11 History of hypertension: labetalol discontinued given IV fluids, stared on Midodrine 5 MG ITD> decreased to 2.5 mg TID 1/13  Thrombocytopenia: Improved.  Monitor Chest pain resolved likely due to hypertension Deconditioning debility weakness: Will request PT OT reevaluation , daughter is concerned that she may not able to get her in and out of a car or back into the house. asked social worker to look into whether she qualifies for skilled nursing facility.  OT reassessed and recommending skilled nursing facility.  TOC working on SNF.  DVT prophylaxis: Place and maintain sequential compression device  Start: 06/14/22 0721 SCDs Start: 06/09/22 2218 Code Status:   Code Status: Full Code Family Communication: plan of  care discussed with patient at bedside.  I had updated patient's daughter at the bedside 1/11  Patient status is: inpatient waiting on placement Level of care: Telemetry Medical   Dispo: The patient is from: home, lives with her son            Anticipated disposition: SNF once available likley over the weekend  Objective: Vitals last 24 hrs: Vitals:   06/27/22 1601 06/27/22 2026 06/28/22 0516 06/28/22 0729  BP: (!) 126/48 (!) 110/42 (!) 114/49 (!) 134/49  Pulse: 99 93 95 91  Resp:  '18 18 18  '$ Temp:  98.3 F (36.8 C) 98 F (36.7 C) 98.8 F (37.1 C)  TempSrc:  Oral Oral Oral  SpO2: 100% 100% 98% 100%  Weight:      Height:       Weight change:   Physical Examination: General exam: AA OX2, weak,older appearing HEENT:Oral mucosa moist, Ear/Nose WNL grossly, dentition normal. Respiratory system: bilaterally diminished BS,no use of accessory muscle Cardiovascular system: S1 & S2 +, regular rate. Gastrointestinal system: Abdomen soft, NT,ND,BS+ Nervous System:Alert, awake, moving extremities and grossly nonfocal Extremities: LE ankle edema neg, lower extremities warm Skin: No rashes,no icterus. MSK: Normal muscle bulk,tone, power    Medications reviewed:  Scheduled Meds:  Chlorhexidine Gluconate Cloth  6 each Topical Q0600   Chlorhexidine Gluconate Cloth  6 each Topical Q0600   darbepoetin (ARANESP) injection - DIALYSIS  60 mcg Subcutaneous Q Mon-1800   dextromethorphan-guaiFENesin  1 tablet Oral BID   hydrocerin   Topical BID   insulin aspart protamine- aspart  3 Units Subcutaneous BID WC   midodrine  2.5 mg Oral TID WC   pantoprazole  40 mg Oral Daily   QUEtiapine  12.5 mg Oral QHS   sevelamer carbonate  800 mg Oral BID AC  Continuous Infusions:   Diet Order             Diet Heart Room service appropriate? Yes; Fluid consistency: Thin  Diet effective now                   Intake/Output Summary (Last 24 hours) at 06/28/2022 0734 Last data filed at 06/28/2022  0600 Gross per 24 hour  Intake 0 ml  Output 0 ml  Net 0 ml    Net IO Since Admission: -9,487.4 mL [06/28/22 0734]  Wt Readings from Last 3 Encounters:  06/04/22 73.8 kg  05/09/22 72.9 kg  05/01/22 74 kg     Unresulted Labs (From admission, onward)    None     Data Reviewed: I have personally reviewed following labs and imaging studies CBC: Recent Labs  Lab 06/21/22 1701 06/21/22 2334 06/23/22 0240 06/25/22 0851 06/26/22 0636 06/27/22 0754  WBC 11.4*  --  11.8* 9.5 12.3* 11.4*  HGB 7.0* 9.1* 8.4* 7.7* 8.3* 8.0*  HCT 21.3* 26.1* 23.9* 23.0* 25.1* 24.0*  MCV 95.5  --  90.2 94.7 95.4 93.8  PLT 182  --  187 187 182 160    Basic Metabolic Panel: Recent Labs  Lab 06/21/22 0736 06/25/22 0851 06/26/22 0636 06/27/22 0754  NA 137 134* 135 135  K 4.4 4.9 4.8 4.9  CL 96* 93* 97* 97*  CO2 '29 28 27 27  '$ GLUCOSE 78 123* 114* 206*  BUN 13 29* 11 20  CREATININE 4.43* 7.63* 4.59* 6.52*  CALCIUM 7.8* 7.4* 7.4* 7.2*  MG  1.6*  --   --   --   PHOS  --  3.2 3.3 3.3   GFR: Estimated Creatinine Clearance: 6.5 mL/min (A) (by C-G formula based on SCr of 6.52 mg/dL (H)). Liver Function Tests: Recent Labs  Lab 06/25/22 0851 06/26/22 0636 06/27/22 0754  ALBUMIN 2.2* 2.3* 2.1*   Coagulation Profile: Recent Labs  Lab 06/21/22 1701  INR 1.3*   CBG: Recent Labs  Lab 06/26/22 2056 06/27/22 1230 06/27/22 1710 06/27/22 2026 06/28/22 0721  GLUCAP 286* 119* 185* 182* 175*    Recent Results (from the past 240 hour(s))  Aerobic/Anaerobic Culture w Gram Stain (surgical/deep wound)     Status: None   Collection Time: 06/18/22  1:51 PM   Specimen: Pleural Fluid  Result Value Ref Range Status   Specimen Description FLUID PLEURAL  Final   Special Requests NONE  Final   Gram Stain NO WBC SEEN NO ORGANISMS SEEN   Final   Culture   Final    No growth aerobically or anaerobically. Performed at [facility], [place] [address], [place], [place] [address]    Report Status  06/23/2022 FINAL  Final    Antimicrobials: Anti-infectives (From admission, onward)    Start     Dose/Rate Route Frequency Ordered Stop   06/18/22 1200  vancomycin (VANCOREADY) IVPB 750 mg/150 mL  Status:  Discontinued        750 mg 150 mL/hr over 60 Minutes Intravenous Every M-W-F (Hemodialysis) 06/14/22 1156 06/19/22 1049   06/15/22 1800  vancomycin (VANCOREADY) IVPB 750 mg/150 mL        750 mg 150 mL/hr over 60 Minutes Intravenous Once in dialysis 06/15/22 1151 06/16/22 0130   06/14/22 2200  piperacillin-tazobactam (ZOSYN) IVPB 2.25 g  Status:  Discontinued        2.25 g 100 mL/hr over 30 Minutes Intravenous Every 8 hours 06/14/22 1806 06/19/22 1049   06/14/22 1800  ceFEPIme (MAXIPIME) 1 g in sodium chloride 0.9 % 100 mL IVPB  Status:  Discontinued        1 g 200 mL/hr over 30 Minutes Intravenous Every 24 hours 06/14/22 1136 06/14/22 1803   06/14/22 1230  vancomycin (VANCOREADY) IVPB 750 mg/150 mL        750 mg 150 mL/hr over 60 Minutes Intravenous  Once 06/14/22 1142 06/14/22 1901   06/12/22 1230  vancomycin (VANCOREADY) IVPB 750 mg/150 mL        750 mg 150 mL/hr over 60 Minutes Intravenous  Once 06/12/22 1142 06/12/22 1351   06/11/22 2200  vancomycin (VANCOREADY) IVPB 750 mg/150 mL  Status:  Discontinued        750 mg 150 mL/hr over 60 Minutes Intravenous Every M-W-F (Hemodialysis) 06/11/22 1559 06/13/22 2043   06/11/22 1600  ceFEPIme (MAXIPIME) 2 g in sodium chloride 0.9 % 100 mL IVPB  Status:  Discontinued        2 g 200 mL/hr over 30 Minutes Intravenous Every M-W-F (Hemodialysis) 06/11/22 1312 06/14/22 1136   06/10/22 2300  ceFEPIme (MAXIPIME) 1 g in sodium chloride 0.9 % 100 mL IVPB  Status:  Discontinued        1 g 200 mL/hr over 30 Minutes Intravenous Every 24 hours 06/09/22 2219 06/11/22 1312   06/10/22 1800  vancomycin (VANCOREADY) IVPB 750 mg/150 mL        750 mg 150 mL/hr over 60 Minutes Intravenous Every T-Th-Sa (Hemodialysis) 06/10/22 1302 06/10/22 2030   06/09/22  2230  ceFEPIme (MAXIPIME) 2 g  in sodium chloride 0.9 % 100 mL IVPB        2 g 200 mL/hr over 30 Minutes Intravenous  Once 06/09/22 2219 06/10/22 0005   06/09/22 2230  vancomycin (VANCOREADY) IVPB 1750 mg/350 mL        1,750 mg 175 mL/hr over 120 Minutes Intravenous  Once 06/09/22 2219 06/10/22 0327   06/09/22 2219  vancomycin variable dose per unstable renal function (pharmacist dosing)  Status:  Discontinued         Does not apply See admin instructions 06/09/22 2219 06/19/22 1049     Culture/Microbiology    Component Value Date/Time   SDES FLUID PLEURAL 06/18/2022 1351   SPECREQUEST NONE 06/18/2022 1351   CULT  06/18/2022 1351    No growth aerobically or anaerobically. Performed at [facility], [place] [address] [place], [place] [address]    REPTSTATUS 06/23/2022 FINAL 06/18/2022 1351  Radiology Studies: No results found.   LOS: 19 days   Antonieta Pert, MD [facility]  06/28/2022, 7:34 AM

## 2022-06-28 NOTE — Progress Notes (Signed)
Mobility Specialist Progress Note:   06/28/22 1445  Mobility  Activity Transferred from chair to bed  Level of Assistance Minimal assist, patient does 75% or more  Assistive Device Front wheel walker  Distance Ambulated (ft) 5 ft  Activity Response Tolerated well  Mobility Referral Yes  $Mobility charge 1 Mobility   Pt eager to return to bed from chair. Required minA to stand and take steps to bed with RW. No c/o throughout transfer. Pt left with all needs met, bed alarm on.  Nelta Numbers Mobility Specialist Please contact via SecureChat or  Rehab office at [phone]

## 2022-06-28 NOTE — Plan of Care (Signed)
  Problem: Activity: Goal: Ability to tolerate increased activity will improve Outcome: Progressing   Problem: Clinical Measurements: Goal: Ability to maintain a body temperature in the normal range will improve Outcome: Progressing   Problem: Respiratory: Goal: Ability to maintain adequate ventilation will improve Outcome: Progressing Goal: Ability to maintain a clear airway will improve Outcome: Progressing   Problem: Education: Goal: Knowledge of General Education information will improve Description: Including pain rating scale, medication(s)/side effects and non-pharmacologic comfort measures Outcome: Progressing   Problem: Health Behavior/Discharge Planning: Goal: Ability to manage health-related needs will improve Outcome: Progressing   Problem: Clinical Measurements: Goal: Ability to maintain clinical measurements within normal limits will improve Outcome: Progressing Goal: Will remain free from infection Outcome: Progressing Goal: Diagnostic test results will improve Outcome: Progressing Goal: Respiratory complications will improve Outcome: Progressing Goal: Cardiovascular complication will be avoided Outcome: Progressing   Problem: Activity: Goal: Risk for activity intolerance will decrease Outcome: Progressing   Problem: Nutrition: Goal: Adequate nutrition will be maintained Outcome: Progressing   Problem: Coping: Goal: Level of anxiety will decrease Outcome: Progressing   Problem: Elimination: Goal: Will not experience complications related to bowel motility Outcome: Progressing Goal: Will not experience complications related to urinary retention Outcome: Progressing   Problem: Pain Managment: Goal: General experience of comfort will improve Outcome: Progressing   Problem: Safety: Goal: Ability to remain free from injury will improve Outcome: Progressing   Problem: Skin Integrity: Goal: Risk for impaired skin integrity will decrease Outcome:  Progressing   Problem: Education: Goal: Ability to describe self-care measures that may prevent or decrease complications (Diabetes Survival Skills Education) will improve Outcome: Progressing Goal: Individualized Educational Video(s) Outcome: Progressing   Problem: Coping: Goal: Ability to adjust to condition or change in health will improve Outcome: Progressing   Problem: Fluid Volume: Goal: Ability to maintain a balanced intake and output will improve Outcome: Progressing   Problem: Health Behavior/Discharge Planning: Goal: Ability to identify and utilize available resources and services will improve Outcome: Progressing Goal: Ability to manage health-related needs will improve Outcome: Progressing   Problem: Metabolic: Goal: Ability to maintain appropriate glucose levels will improve Outcome: Progressing   Problem: Nutritional: Goal: Maintenance of adequate nutrition will improve Outcome: Progressing Goal: Progress toward achieving an optimal weight will improve Outcome: Progressing   Problem: Skin Integrity: Goal: Risk for impaired skin integrity will decrease Outcome: Progressing   Problem: Tissue Perfusion: Goal: Adequacy of tissue perfusion will improve Outcome: Progressing

## 2022-06-28 NOTE — Progress Notes (Signed)
Occupational Therapy Treatment Patient Details Name: [name] MRN: [id number] DOB: [birth year] Today's Date: 06/28/2022   History of present illness 82 y.o. female admitted on 06/07/2022 with left-sided pleural effusion, pneumonia and dyspnea. PMHx: ESRD, diabetes mellitus type 2, GERD, hypertension, history of stroke.  New OT order placed 1/9 for consideration of post acute rehab.   OT comments  Pt. Seen for skilled OT treatment session.  Pt. Able to complete bed mobility without physical assistance.  LB dressing task seated eob and transfer with RW to recliner.  Cont. With current acute OT POC.  Agree with current d/c recommendations.     Recommendations for follow up therapy are one component of a multi-disciplinary discharge planning process, led by the attending physician.  Recommendations may be updated based on patient status, additional functional criteria and insurance authorization.    Follow Up Recommendations  Skilled nursing-short term rehab (<3 hours/day)     Assistance Recommended at Discharge Frequent or constant Supervision/Assistance  Patient can return home with the following  A lot of help with bathing/dressing/bathroom;A lot of help with walking and/or transfers;Assist for transportation;Assistance with cooking/housework;Assistance with feeding;Help with stairs or ramp for entrance   Equipment Recommendations  BSC/3in1    Recommendations for Other Services      Precautions / Restrictions Precautions Precautions: Fall Precaution Comments: Currenlty with low blood sugars       Mobility Bed Mobility Overal bed mobility: Needs Assistance Bed Mobility: Supine to Sit     Supine to sit: Supervision     General bed mobility comments: no physical assistance to transition into sitting or scoot towards eob    Transfers Overall transfer level: Needs assistance Equipment used: Rolling walker (2 wheels) Transfers: Sit to/from Stand, Bed to  chair/wheelchair/BSC Sit to Stand: Min guard     Step pivot transfers: Min assist           Balance                                           ADL either performed or assessed with clinical judgement   ADL Overall ADL's : Needs assistance/impaired                     Lower Body Dressing: Supervision/safety;Sitting/lateral leans Lower Body Dressing Details (indicate cue type and reason): pt. able to cross each leg over knee to reach feet to don/doff socks Toilet Transfer: Minimal assistance;Cueing for sequencing;Rolling walker (2 wheels) Toilet Transfer Details (indicate cue type and reason): observed with transfer from eob to recliner, cues for hand placement on arm rests during transtion into sitting         Functional mobility during ADLs: Min guard;Supervision/safety;Rolling walker (2 wheels);Minimal assistance      Extremity/Trunk Assessment              Vision       Perception     Praxis      Cognition Arousal/Alertness: Awake/alert Behavior During Therapy: WFL for tasks assessed/performed Overall Cognitive Status: No family/caregiver present to determine baseline cognitive functioning                                          Exercises      Shoulder Instructions  General Comments      Pertinent Vitals/ Pain       Pain Assessment Pain Assessment: No/denies pain  Home Living                                          Prior Functioning/Environment              Frequency  Min 2X/week        Progress Toward Goals  OT Goals(current goals can now be found in the care plan section)  Progress towards OT goals: Progressing toward goals     Plan Discharge plan remains appropriate    Co-evaluation                 AM-PAC OT "6 Clicks" Daily Activity     Outcome Measure   Help from another person eating meals?: A Lot Help from another person taking care of  personal grooming?: A Lot Help from another person toileting, which includes using toliet, bedpan, or urinal?: A Lot Help from another person bathing (including washing, rinsing, drying)?: A Lot Help from another person to put on and taking off regular upper body clothing?: A Lot Help from another person to put on and taking off regular lower body clothing?: A Lot 6 Click Score: 12    End of Session Equipment Utilized During Treatment: Rolling walker (2 wheels)  OT Visit Diagnosis: Unsteadiness on feet (R26.81);Other abnormalities of gait and mobility (R26.89);Muscle weakness (generalized) (M62.81)   Activity Tolerance Patient tolerated treatment well   Patient Left in chair;with call bell/phone within reach;with chair alarm set   Nurse Communication Other (comment) (rn came in to check on pt. towards end of session, reviewed pt. in recliner with chair alarm in place)        Time: 7408-1448 OT Time Calculation (min): 8 min  Charges: OT General Charges $OT Visit: 1 Visit OT Treatments $Self Care/Home Management : 8-22 mins  [name], COTA/L Acute Rehabilitation [phone]   [name]-COTA/L 06/28/2022, 12:02 PM

## 2022-06-29 DIAGNOSIS — J90 Pleural effusion, not elsewhere classified: Secondary | ICD-10-CM | POA: Diagnosis not present

## 2022-06-29 LAB — GLUCOSE, CAPILLARY
Glucose-Capillary: 166 mg/dL — ABNORMAL HIGH (ref 70–99)
Glucose-Capillary: 162 mg/dL — ABNORMAL HIGH (ref 70–99)
Glucose-Capillary: 275 mg/dL — ABNORMAL HIGH (ref 70–99)
Glucose-Capillary: 199 mg/dL — ABNORMAL HIGH (ref 70–99)

## 2022-06-29 MED ORDER — MIDODRINE HCL 5 MG PO TABS: 2.5000 mg | ORAL_TABLET | Freq: Three times a day (TID) | ORAL | Status: DC | PRN

## 2022-06-29 NOTE — Progress Notes (Addendum)
Mobility Specialist Progress Note:   06/29/22 1400  Mobility  Activity Ambulated with assistance in hallway  Level of Assistance Moderate assist, patient does 50-74%  Assistive Device Front wheel walker  Distance Ambulated (ft) 70 ft  Activity Response Tolerated well  Mobility Referral Yes  $Mobility charge 1 Mobility   Pt agreeable to mobility session. Require up to modA to stand (from low toilet), minA from all other surfaces and during ambulation. Pt very quickly fatigued, SpO2 99% RA. Pt left sitting in chair with all needs met, chair alarm on.  Nelta Numbers Mobility Specialist Please contact via SecureChat or  Rehab office at [phone]

## 2022-06-29 NOTE — Progress Notes (Signed)
PROGRESS NOTE [name]  UYQ:[id number] DOB: 04/20/1941 DOA: 06/09/2022 PCP: [name], MD   Brief Narrative/Hospital Course:  82 y.o. female with a history of ESRD, diabetes mellitus type 2, GERD, hypertension, stroke-. Patient presented secondary to shortness of breath and found to have recurrent pneumonia with associated left sided pleural effusion. Empiric antibiotics initiated and patient transferred to [facility] for consideration of chest tube placement. During admission, patient developed hospital delirium.  Patient was seen by nephrology continue on hemodialysis.  IR consulted underwent left 12 French pleural drain placed by IR 1/3, followed by PCCM, s/p thrombolytic instillation on 1/4. Patient continued chest tube in place.  1/7 bloody drainage noted from chest tube Chest tube removed 1/8 evening and did well since then doing well on room air.  Seen by nephrology for ongoing dialysis per schedule. Having few episodes of hypoglycemia insulin completely discontinued oral diet encouraged oral intake.  Blood pressure was low> Lipitor discontinued placed on midodrine x few days> changed to as needed 06/29/22. Remains medically stable pending placement  Subjective: Seen and examined this morning alert awake with minimal confusion at baseline.  BPon higher side > midodrine not given Overnight patient has been afebrile No more hypoglycemia  Assessment and Plan: Principal Problem:   Pleural effusion Active Problems:   Essential hypertension   Thrombocytopenia (HCC)   ESRD (end stage renal disease) (HCC)   DMII (diabetes mellitus, type 2) (HCC)   GERD (gastroesophageal reflux disease)   CAP (community acquired pneumonia)   Delirium   Need for management of chest tube   Hemothorax on left   Loculated pleural effusion   Acute blood loss anemia   Hemothorax  Recurrent left exudative pleural effusion Left hemothorax after intrapleural Lytics: Effusion from  multifocal pneumonia in mid December.Cultures negative MRSA swab was negative.Chest tube placed by IR 1/3,thrombolytics instilled 1/5.patient completed antibiotics.CT 1/8 showed a small loculated pleural effusion on the left with chest tube in place with decrease in size>Chest tube removed 1/8> and has been stable since on room air.    Acute blood loss anemia in the setting of chronic anemia: Improved after transfusion. Monitor HH intermittently Recent Labs  Lab 06/23/22 0240 06/25/22 0851 06/26/22 0636 06/27/22 0754  HGB 8.4* 7.7* 8.3* 8.0*  HCT 23.9* 23.0* 25.1* 24.0*  Delirium:In the setting of history of multiple vascular injuries and current hospitalization. Likely hospital related delirium.  Improved, CT head unremarkable continue Seroquel at nighttime, continue fall precautions supportive care delirium precaution.  She is alert and oriented x 2 at baseline    GERD:continue PPI   T2DM with hypoglycemia: No more hypoglycemia.  Patient is placed back on 70/30 insulin units twice daily at lower-dose, on SSI.  On discharge continue current insulin regimen Recent Labs  Lab 06/28/22 0721 06/28/22 1139 06/28/22 1635 06/28/22 2055 06/29/22 0726  GLUCAP 175* 173* 172* 177* 166*    ESRD on HD MWF ON hd 1/12.  Nephrology following  Hypotension overnight  1/10- 1/11 History of hypertension: labetalol discontinued given IV fluids, stared on Midodrine 5 MG ITD> decreased to 2.5 mg TID 1/13> changed to prn 1/14.  Thrombocytopenia: Improved.  Monitor Chest pain resolved likely due to hypertension Deconditioning debility weakness: PT OT following.  Daughter is concerned that she may not able to get her in and out of a car or back into the house. asked social worker to look into whether she qualifies for skilled nursing facility.  OT reassessed and recommending skilled nursing facility. TOC  working on SNF.  DVT prophylaxis: Place and maintain sequential compression device Start: 06/14/22  6294 Code Status:   Code Status: Full Code Family Communication: plan of care discussed with patient at bedside.  I had updated patient's daughter at the bedside 1/11  Patient status is: inpatient waiting on placement Level of care: Telemetry Medical   Dispo: The patient is from: home, lives with her son            Anticipated disposition: SNF once bed available.  Currently she is medically stable   Objective: Vitals last 24 hrs: Vitals:   06/28/22 1636 06/28/22 2053 06/29/22 0513 06/29/22 0728  BP: 137/66 (!) 136/57 (!) 142/52 (!) 152/71  Pulse: 91 92 94   Resp: '17 18 18 18  '$ Temp: 98.2 F (36.8 C) 99 F (37.2 C) 98.8 F (37.1 C) 99.5 F (37.5 C)  TempSrc:  Oral Oral Oral  SpO2: 100% 100% 99% 100%  Weight:      Height:       Weight change:   Physical Examination: General exam: AAOX2-3, weak,older appearing HEENT:Oral mucosa moist, Ear/Nose WNL grossly, dentition normal. Respiratory system: bilaterally CLEAR BS, no use of accessory muscle Cardiovascular system: S1 & S2 +, regular rate. Gastrointestinal system: Abdomen soft, NT,ND,BS+ Nervous System:Alert, awake, moving extremities and grossly nonfocal Extremities: LE ankle edema neg, lower extremities warm Skin: No rashes,no icterus. MSK: Normal muscle bulk,tone, power    Medications reviewed:  Scheduled Meds:  Chlorhexidine Gluconate Cloth  6 each Topical Q0600   darbepoetin (ARANESP) injection - DIALYSIS  60 mcg Subcutaneous Q Mon-1800   dextromethorphan-guaiFENesin  1 tablet Oral BID   hydrocerin   Topical BID   insulin aspart protamine- aspart  3 Units Subcutaneous BID WC   pantoprazole  40 mg Oral Daily   QUEtiapine  12.5 mg Oral QHS   sevelamer carbonate  800 mg Oral BID AC  Continuous Infusions:   Diet Order             Diet Heart Room service appropriate? Yes; Fluid consistency: Thin  Diet effective now                   Intake/Output Summary (Last 24 hours) at 06/29/2022 0919 Last data filed at  06/29/2022 0315 Gross per 24 hour  Intake 680 ml  Output 0 ml  Net 680 ml   Net IO Since Admission: -8,687.4 mL [06/29/22 0919]  Wt Readings from Last 3 Encounters:  06/04/22 73.8 kg  05/09/22 72.9 kg  05/01/22 74 kg     Unresulted Labs (From admission, onward)    None     Data Reviewed: I have personally reviewed following labs and imaging studies CBC: Recent Labs  Lab 06/23/22 0240 06/25/22 0851 06/26/22 0636 06/27/22 0754  WBC 11.8* 9.5 12.3* 11.4*  HGB 8.4* 7.7* 8.3* 8.0*  HCT 23.9* 23.0* 25.1* 24.0*  MCV 90.2 94.7 95.4 93.8  PLT 187 187 182 765   Basic Metabolic Panel: Recent Labs  Lab 06/25/22 0851 06/26/22 0636 06/27/22 0754  NA 134* 135 135  K 4.9 4.8 4.9  CL 93* 97* 97*  CO2 '28 27 27  '$ GLUCOSE 123* 114* 206*  BUN 29* 11 20  CREATININE 7.63* 4.59* 6.52*  CALCIUM 7.4* 7.4* 7.2*  PHOS 3.2 3.3 3.3  GFR: Estimated Creatinine Clearance: 6.5 mL/min (A) (by C-G formula based on SCr of 6.52 mg/dL (H)). Liver Function Tests: Recent Labs  Lab 06/25/22 0851 06/26/22 0636 06/27/22 0754  ALBUMIN 2.2*  2.3* 2.1*  Coagulation Profile: No results for input(s): "INR", "PROTIME" in the last 168 hours. CBG: Recent Labs  Lab 06/28/22 0721 06/28/22 1139 06/28/22 1635 06/28/22 2055 06/29/22 0726  GLUCAP 175* 173* 172* 177* 166*   No results found for this or any previous visit (from the past 240 hour(s)).   Antimicrobials: Anti-infectives (From admission, onward)    Start     Dose/Rate Route Frequency Ordered Stop   06/18/22 1200  vancomycin (VANCOREADY) IVPB 750 mg/150 mL  Status:  Discontinued        750 mg 150 mL/hr over 60 Minutes Intravenous Every M-W-F (Hemodialysis) 06/14/22 1156 06/19/22 1049   06/15/22 1800  vancomycin (VANCOREADY) IVPB 750 mg/150 mL        750 mg 150 mL/hr over 60 Minutes Intravenous Once in dialysis 06/15/22 1151 06/16/22 0130   06/14/22 2200  piperacillin-tazobactam (ZOSYN) IVPB 2.25 g  Status:  Discontinued        2.25  g 100 mL/hr over 30 Minutes Intravenous Every 8 hours 06/14/22 1806 06/19/22 1049   06/14/22 1800  ceFEPIme (MAXIPIME) 1 g in sodium chloride 0.9 % 100 mL IVPB  Status:  Discontinued        1 g 200 mL/hr over 30 Minutes Intravenous Every 24 hours 06/14/22 1136 06/14/22 1803   06/14/22 1230  vancomycin (VANCOREADY) IVPB 750 mg/150 mL        750 mg 150 mL/hr over 60 Minutes Intravenous  Once 06/14/22 1142 06/14/22 1901   06/12/22 1230  vancomycin (VANCOREADY) IVPB 750 mg/150 mL        750 mg 150 mL/hr over 60 Minutes Intravenous  Once 06/12/22 1142 06/12/22 1351   06/11/22 2200  vancomycin (VANCOREADY) IVPB 750 mg/150 mL  Status:  Discontinued        750 mg 150 mL/hr over 60 Minutes Intravenous Every M-W-F (Hemodialysis) 06/11/22 1559 06/13/22 2043   06/11/22 1600  ceFEPIme (MAXIPIME) 2 g in sodium chloride 0.9 % 100 mL IVPB  Status:  Discontinued        2 g 200 mL/hr over 30 Minutes Intravenous Every M-W-F (Hemodialysis) 06/11/22 1312 06/14/22 1136   06/10/22 2300  ceFEPIme (MAXIPIME) 1 g in sodium chloride 0.9 % 100 mL IVPB  Status:  Discontinued        1 g 200 mL/hr over 30 Minutes Intravenous Every 24 hours 06/09/22 2219 06/11/22 1312   06/10/22 1800  vancomycin (VANCOREADY) IVPB 750 mg/150 mL        750 mg 150 mL/hr over 60 Minutes Intravenous Every T-Th-Sa (Hemodialysis) 06/10/22 1302 06/10/22 2030   06/09/22 2230  ceFEPIme (MAXIPIME) 2 g in sodium chloride 0.9 % 100 mL IVPB        2 g 200 mL/hr over 30 Minutes Intravenous  Once 06/09/22 2219 06/10/22 0005   06/09/22 2230  vancomycin (VANCOREADY) IVPB 1750 mg/350 mL        1,750 mg 175 mL/hr over 120 Minutes Intravenous  Once 06/09/22 2219 06/10/22 0327   06/09/22 2219  vancomycin variable dose per unstable renal function (pharmacist dosing)  Status:  Discontinued         Does not apply See admin instructions 06/09/22 2219 06/19/22 1049     Culture/Microbiology    Component Value Date/Time   SDES FLUID PLEURAL 06/18/2022 1351    SPECREQUEST NONE 06/18/2022 1351   CULT  06/18/2022 1351    No growth aerobically or anaerobically. Performed at [facility], [address],  [place] [address]    REPTSTATUS 06/23/2022 FINAL 06/18/2022 1351  Radiology Studies: No results found.   LOS: 20 days   Antonieta Pert, MD [facility]  06/29/2022, 9:19 AM

## 2022-06-29 NOTE — Progress Notes (Signed)
Patient ID: [name], female   DOB: 02/13/1941, 82 y.o.   MRN: [id number] [facility] Progress Note   Assessment/ Plan:   1.  Acute hypoxic respiratory failure: Secondary to loculated left-sided pleural effusion status post pleural drain on 06/18/2022 allowed by thrombolytic therapy on 1/5.  She thereafter developed a hemothorax from intrapleural thrombolytics; status post chest tube placement that was discontinued mid last week with recommendations for follow-up imaging if clinically worse. 2. ESRD: She is usually on MWF dialysis schedule and I will order for her next hemodialysis treatment tomorrow as we await SNF placement.  Midodrine discontinued with rising blood pressures. 3. Anemia: Secondary to losses associated with hemothorax as well as underlying chronic kidney disease/ESRD.  Continue ESA. 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 5. Nutrition: Continue current renal diet with oral nutritional/protein supplementation.  Subjective:   Reports to be feeling fair and inquires about SNF bed offers.   Objective:   BP (!) 152/71 (BP Location: Left Arm)   Pulse 94   Temp 99.5 F (37.5 C) (Oral)   Resp 18   Ht '5\' 7"'$  (1.702 m)   Wt 64.3 kg   SpO2 100%   BMI 22.20 kg/m   Physical Exam: Gen: Resting comfortably in bed without oxygen supplementation.  Alert/oriented CVS: Pulse regular rhythm, normal rate, S1 and S2 normal Resp: Decreased inspiratory effort with diminished breath sounds at the bases otherwise clear to auscultation Abd: Soft, flat, nontender Ext: No lower extremity edema.  Right upper arm AV fistula is pulsatile with intact dressings.  Labs: BMET Recent Labs  Lab 06/25/22 0851 06/26/22 0636 06/27/22 0754  NA 134* 135 135  K 4.9 4.8 4.9  CL 93* 97* 97*  CO2 '28 27 27  '$ GLUCOSE 123* 114* 206*  BUN 29* 11 20  CREATININE 7.63* 4.59* 6.52*  CALCIUM 7.4* 7.4* 7.2*  PHOS 3.2 3.3 3.3    CBC Recent Labs  Lab 06/23/22 0240 06/25/22 0851 06/26/22 0636 06/27/22 0754  WBC 11.8* 9.5 12.3* 11.4*  HGB 8.4* 7.7* 8.3* 8.0*  HCT 23.9* 23.0* 25.1* 24.0*  MCV 90.2 94.7 95.4 93.8  PLT 187 187 182 167      Medications:     Chlorhexidine Gluconate Cloth  6 each Topical Q0600   darbepoetin (ARANESP) injection - DIALYSIS  60 mcg Subcutaneous Q Mon-1800   dextromethorphan-guaiFENesin  1 tablet Oral BID   hydrocerin   Topical BID   insulin aspart protamine- aspart  3 Units Subcutaneous BID WC   pantoprazole  40 mg Oral Daily   QUEtiapine  12.5 mg Oral QHS   sevelamer carbonate  800 mg Oral BID AC   Elmarie Shiley, MD 06/29/2022, 9:08 AM

## 2022-06-29 NOTE — Plan of Care (Signed)
  Problem: Activity: Goal: Ability to tolerate increased activity will improve Outcome: Progressing   Problem: Clinical Measurements: Goal: Ability to maintain a body temperature in the normal range will improve Outcome: Progressing   Problem: Respiratory: Goal: Ability to maintain adequate ventilation will improve Outcome: Progressing Goal: Ability to maintain a clear airway will improve Outcome: Progressing   Problem: Education: Goal: Knowledge of General Education information will improve Description: Including pain rating scale, medication(s)/side effects and non-pharmacologic comfort measures Outcome: Progressing   Problem: Health Behavior/Discharge Planning: Goal: Ability to manage health-related needs will improve Outcome: Progressing   Problem: Clinical Measurements: Goal: Ability to maintain clinical measurements within normal limits will improve Outcome: Progressing Goal: Will remain free from infection Outcome: Progressing Goal: Diagnostic test results will improve Outcome: Progressing Goal: Respiratory complications will improve Outcome: Progressing Goal: Cardiovascular complication will be avoided Outcome: Progressing   Problem: Activity: Goal: Risk for activity intolerance will decrease Outcome: Progressing   Problem: Nutrition: Goal: Adequate nutrition will be maintained Outcome: Progressing   Problem: Coping: Goal: Level of anxiety will decrease Outcome: Progressing   Problem: Elimination: Goal: Will not experience complications related to bowel motility Outcome: Progressing Goal: Will not experience complications related to urinary retention Outcome: Progressing   Problem: Pain Managment: Goal: General experience of comfort will improve Outcome: Progressing   Problem: Safety: Goal: Ability to remain free from injury will improve Outcome: Progressing   Problem: Skin Integrity: Goal: Risk for impaired skin integrity will decrease Outcome:  Progressing   Problem: Education: Goal: Ability to describe self-care measures that may prevent or decrease complications (Diabetes Survival Skills Education) will improve Outcome: Progressing Goal: Individualized Educational Video(s) Outcome: Progressing   Problem: Coping: Goal: Ability to adjust to condition or change in health will improve Outcome: Progressing   Problem: Fluid Volume: Goal: Ability to maintain a balanced intake and output will improve Outcome: Progressing   Problem: Health Behavior/Discharge Planning: Goal: Ability to identify and utilize available resources and services will improve Outcome: Progressing Goal: Ability to manage health-related needs will improve Outcome: Progressing   Problem: Metabolic: Goal: Ability to maintain appropriate glucose levels will improve Outcome: Progressing   Problem: Nutritional: Goal: Maintenance of adequate nutrition will improve Outcome: Progressing Goal: Progress toward achieving an optimal weight will improve Outcome: Progressing   Problem: Skin Integrity: Goal: Risk for impaired skin integrity will decrease Outcome: Progressing   Problem: Tissue Perfusion: Goal: Adequacy of tissue perfusion will improve Outcome: Progressing

## 2022-06-30 DIAGNOSIS — J189 Pneumonia, unspecified organism: Secondary | ICD-10-CM | POA: Diagnosis not present

## 2022-06-30 DIAGNOSIS — N186 End stage renal disease: Secondary | ICD-10-CM | POA: Diagnosis not present

## 2022-06-30 DIAGNOSIS — D62 Acute posthemorrhagic anemia: Secondary | ICD-10-CM | POA: Diagnosis not present

## 2022-06-30 DIAGNOSIS — J90 Pleural effusion, not elsewhere classified: Secondary | ICD-10-CM | POA: Diagnosis not present

## 2022-06-30 LAB — CBC
HCT: 26.5 % — ABNORMAL LOW (ref 36.0–46.0)
Hemoglobin: 8.7 g/dL — ABNORMAL LOW (ref 12.0–15.0)
MCH: 31 pg (ref 26.0–34.0)
MCHC: 32.8 g/dL (ref 30.0–36.0)
MCV: 94.3 fL (ref 80.0–100.0)
Platelets: 180 10*3/uL (ref 150–400)
RBC: 2.81 MIL/uL — ABNORMAL LOW (ref 3.87–5.11)
RDW: 15.8 % — ABNORMAL HIGH (ref 11.5–15.5)
WBC: 9.3 10*3/uL (ref 4.0–10.5)
nRBC: 0 % (ref 0.0–0.2)

## 2022-06-30 LAB — GLUCOSE, CAPILLARY
Glucose-Capillary: 286 mg/dL — ABNORMAL HIGH (ref 70–99)
Glucose-Capillary: 112 mg/dL — ABNORMAL HIGH (ref 70–99)
Glucose-Capillary: 259 mg/dL — ABNORMAL HIGH (ref 70–99)
Glucose-Capillary: 283 mg/dL — ABNORMAL HIGH (ref 70–99)

## 2022-06-30 LAB — RENAL FUNCTION PANEL
Albumin: 2.3 g/dL — ABNORMAL LOW (ref 3.5–5.0)
Anion gap: 12 (ref 5–15)
BUN: 21 mg/dL (ref 8–23)
CO2: 28 mmol/L (ref 22–32)
Calcium: 7.9 mg/dL — ABNORMAL LOW (ref 8.9–10.3)
Chloride: 93 mmol/L — ABNORMAL LOW (ref 98–111)
Creatinine, Ser: 7.71 mg/dL — ABNORMAL HIGH (ref 0.44–1.00)
GFR, Estimated: 5 mL/min — ABNORMAL LOW (ref >60)
Glucose, Bld: 277 mg/dL — ABNORMAL HIGH (ref 70–99)
Phosphorus: 2.3 mg/dL — ABNORMAL LOW (ref 2.5–4.6)
Potassium: 4.5 mmol/L (ref 3.5–5.1)
Sodium: 133 mmol/L — ABNORMAL LOW (ref 135–145)

## 2022-06-30 NOTE — Procedures (Signed)
I was present at this dialysis session. I have reviewed the session itself and made appropriate changes.   Vital signs in last 24 hours:  Temp:  [97.9 F (36.6 C)-98.5 F (36.9 C)] 97.9 F (36.6 C) (01/15 0802) Pulse Rate:  [92-100] 92 (01/15 0830) Resp:  [16-19] 16 (01/15 0830) BP: (133-154)/(60-68) 150/68 (01/15 0830) SpO2:  [99 %-100 %] 100 % (01/15 0830) Weight:  [65.6 kg] 65.6 kg (01/15 0802) Weight change:  Filed Weights   06/25/22 1242 06/25/22 1944 06/30/22 0802  Weight: 62 kg 64.3 kg 65.6 kg    Recent Labs  Lab 06/30/22 0710  NA 133*  K 4.5  CL 93*  CO2 28  GLUCOSE 277*  BUN 21  CREATININE 7.71*  CALCIUM 7.9*  PHOS 2.3*    Recent Labs  Lab 06/26/22 0636 06/27/22 0754 06/30/22 0710  WBC 12.3* 11.4* 9.3  HGB 8.3* 8.0* 8.7*  HCT 25.1* 24.0* 26.5*  MCV 95.4 93.8 94.3  PLT 182 167 180    Scheduled Meds:  Chlorhexidine Gluconate Cloth  6 each Topical Q0600   darbepoetin (ARANESP) injection - DIALYSIS  60 mcg Subcutaneous Q Mon-1800   dextromethorphan-guaiFENesin  1 tablet Oral BID   hydrocerin   Topical BID   insulin aspart protamine- aspart  3 Units Subcutaneous BID WC   pantoprazole  40 mg Oral Daily   QUEtiapine  12.5 mg Oral QHS   sevelamer carbonate  800 mg Oral BID AC   Continuous Infusions: PRN Meds:.acetaminophen **OR** acetaminophen, albuterol, fentaNYL (SUBLIMAZE) injection, hydrOXYzine, ondansetron **OR** ondansetron (ZOFRAN) IV, oxyCODONE   Donetta Potts,  MD 06/30/2022, 8:57 AM

## 2022-06-30 NOTE — Plan of Care (Signed)
  Problem: Activity: Goal: Ability to tolerate increased activity will improve Outcome: Progressing   Problem: Clinical Measurements: Goal: Ability to maintain a body temperature in the normal range will improve Outcome: Progressing   Problem: Respiratory: Goal: Ability to maintain adequate ventilation will improve Outcome: Progressing Goal: Ability to maintain a clear airway will improve Outcome: Progressing   Problem: Education: Goal: Knowledge of General Education information will improve Description: Including pain rating scale, medication(s)/side effects and non-pharmacologic comfort measures Outcome: Progressing   Problem: Health Behavior/Discharge Planning: Goal: Ability to manage health-related needs will improve Outcome: Progressing   Problem: Clinical Measurements: Goal: Ability to maintain clinical measurements within normal limits will improve Outcome: Progressing Goal: Will remain free from infection Outcome: Progressing Goal: Diagnostic test results will improve Outcome: Progressing Goal: Respiratory complications will improve Outcome: Progressing Goal: Cardiovascular complication will be avoided Outcome: Progressing   Problem: Activity: Goal: Risk for activity intolerance will decrease Outcome: Progressing   Problem: Nutrition: Goal: Adequate nutrition will be maintained Outcome: Progressing   Problem: Coping: Goal: Level of anxiety will decrease Outcome: Progressing   Problem: Elimination: Goal: Will not experience complications related to bowel motility Outcome: Progressing Goal: Will not experience complications related to urinary retention Outcome: Progressing   Problem: Pain Managment: Goal: General experience of comfort will improve Outcome: Progressing   Problem: Safety: Goal: Ability to remain free from injury will improve Outcome: Progressing   Problem: Skin Integrity: Goal: Risk for impaired skin integrity will decrease Outcome:  Progressing   Problem: Education: Goal: Ability to describe self-care measures that may prevent or decrease complications (Diabetes Survival Skills Education) will improve Outcome: Progressing Goal: Individualized Educational Video(s) Outcome: Progressing   Problem: Coping: Goal: Ability to adjust to condition or change in health will improve Outcome: Progressing   Problem: Fluid Volume: Goal: Ability to maintain a balanced intake and output will improve Outcome: Progressing   Problem: Health Behavior/Discharge Planning: Goal: Ability to identify and utilize available resources and services will improve Outcome: Progressing Goal: Ability to manage health-related needs will improve Outcome: Progressing   Problem: Metabolic: Goal: Ability to maintain appropriate glucose levels will improve Outcome: Progressing   Problem: Nutritional: Goal: Maintenance of adequate nutrition will improve Outcome: Progressing Goal: Progress toward achieving an optimal weight will improve Outcome: Progressing   Problem: Skin Integrity: Goal: Risk for impaired skin integrity will decrease Outcome: Progressing   Problem: Tissue Perfusion: Goal: Adequacy of tissue perfusion will improve Outcome: Progressing

## 2022-06-30 NOTE — Progress Notes (Signed)
[name]  RJJ:[id number] DOB: 06/18/1940 DOA: 06/09/2022 PCP: [name], MD   Brief Narrative/Hospital Course:  82 y.o. female with a history of ESRD, diabetes mellitus type 2, GERD, hypertension, stroke admitted on 12/25 for recurrent pneumonia with associated left sided pleural effusion. Empiric antibiotics initiated and patient transferred to [facility] for chest tube placement. During admission, patient developed hospital delirium.  Patient was seen by nephrology continue on hemodialysis.  Status post 67 French pleural drain placed by IR 1/3, followed by PCCM, s/p thrombolytic instillation on 1/4.   Chest tube able to be removed 1/8.  Nephrology following for ongoing dialysis.  Hospitalization complicated by delirium which has since improved.    Subjective: Patient doing okay, no complaints  Assessment and Plan: Principal Problem:   Pleural effusion Active Problems:   Essential hypertension   Thrombocytopenia (HCC)   ESRD (end stage renal disease) (HCC)   DMII (diabetes mellitus, type 2) (HCC)   GERD (gastroesophageal reflux disease)   CAP (community acquired pneumonia)   Delirium   Need for management of chest tube   Hemothorax on left   Loculated pleural effusion   Acute blood loss anemia   Hemothorax  Recurrent left exudative pleural effusion Left hemothorax after intrapleural Lytics: Effusion from multifocal pneumonia in mid December.  Cultures negative MRSA swab was negative.status post chest tube placement on 1/3 - 1/8 with thrombolytics instilled.  Patient now stable on room air.   Acute blood loss anemia in the setting of chronic anemia: Improved after transfusion. Monitor HH intermittently Recent Labs  Lab 06/25/22 0851 06/26/22 0636 06/27/22 0754 06/30/22 0710  HGB 7.7* 8.3* 8.0* 8.7*  HCT 23.0* 25.1* 24.0* 26.5*   Delirium-resolved:In the setting of history of multiple vascular injuries and current hospitalization. Likely  hospital related delirium.  Improved, CT head unremarkable continue Seroquel at nighttime, continue fall precautions supportive care delirium precaution.  She is alert and oriented x 2 at baseline    GERD:continue PPI   T2DM with hypoglycemia: No more hypoglycemia.  Patient is placed back on 70/30 insulin units twice daily at lower-dose, on SSI.  On discharge continue current insulin regimen Recent Labs  Lab 06/29/22 1112 06/29/22 1634 06/29/22 2021 06/30/22 0708 06/30/22 1232  GLUCAP 162* 275* 199* 286* 112*     ESRD on HD MWF ON hd 1/12.  Nephrology following  Hypotension-resolved History of hypertension: Occurred on night of 1/10.  Labetalol discontinued given IV fluids, stared on Midodrine 5 MG ITD> decreased to 2.5 mg TID 1/13> changed to prn 1/14.  Thrombocytopenia: Improved.  Monitor Chest pain resolved likely due to hypertension  Deconditioning debility weakness: Seen by PT and OT for skilled nursing  DVT prophylaxis: Place and maintain sequential compression device Start: 06/14/22 0721 Code Status:   Code Status: Full Code  Family Communication: plan of care discussed with patient at bedside.  I had updated patient's daughter at the bedside 1/11  Patient status is: inpatient waiting on placement Level of care: Telemetry Medical   Dispo: Skilled nursing when bed available.  Objective: Vitals last 24 hrs: Vitals:   06/30/22 1130 06/30/22 1151 06/30/22 1210 06/30/22 1233  BP: 94/64 (!) 110/49 (!) 134/56 (!) 132/97  Pulse: 88 94 93 94  Resp: '17 19 14 17  '$ Temp:  98.3 F (36.8 C) 98.3 F (36.8 C) 98.3 F (36.8 C)  TempSrc:  Oral Oral   SpO2: 100% 100% 100% 100%  Weight:   64.6 kg   Height:  Weight change:   Physical Examination: General exam: Alert and oriented x 2, no acute distress HEENT: Normocephalic, atraumatic, mucous membranes are moist Respiratory system: Clear to auscultation bilaterally Cardiovascular system: Regular rate and rhythm,  S1-S2 Gastrointestinal system: Soft, nontender, nondistended, positive bowel sounds Nervous System: No focal deficits Extremities: Trace pitting edema Skin: No skin breaks, tears or lesions MSK: Normal muscle bulk,tone, power    Medications reviewed:  Scheduled Meds:  Chlorhexidine Gluconate Cloth  6 each Topical Q0600   darbepoetin (ARANESP) injection - DIALYSIS  60 mcg Subcutaneous Q Mon-1800   dextromethorphan-guaiFENesin  1 tablet Oral BID   hydrocerin   Topical BID   insulin aspart protamine- aspart  3 Units Subcutaneous BID WC   pantoprazole  40 mg Oral Daily   QUEtiapine  12.5 mg Oral QHS   sevelamer carbonate  800 mg Oral BID AC  Continuous Infusions:   Diet Order             Diet Heart Room service appropriate? Yes; Fluid consistency: Thin  Diet effective now                   Intake/Output Summary (Last 24 hours) at 06/30/2022 1615 Last data filed at 06/30/2022 1400 Gross per 24 hour  Intake 840 ml  Output 1000 ml  Net -160 ml    Net IO Since Admission: -8,447.4 mL [06/30/22 1615]  Wt Readings from Last 3 Encounters:  06/30/22 64.6 kg  06/04/22 73.8 kg  05/09/22 72.9 kg     Unresulted Labs (From admission, onward)    None     Data Reviewed:  Sodium today at 133, glucose of 277, creatinine 7.71, albumin 2.3 with improving hemoglobin of 8.7  Antimicrobials: Anti-infectives (From admission, onward)    Start     Dose/Rate Route Frequency Ordered Stop   06/18/22 1200  vancomycin (VANCOREADY) IVPB 750 mg/150 mL  Status:  Discontinued        750 mg 150 mL/hr over 60 Minutes Intravenous Every M-W-F (Hemodialysis) 06/14/22 1156 06/19/22 1049   06/15/22 1800  vancomycin (VANCOREADY) IVPB 750 mg/150 mL        750 mg 150 mL/hr over 60 Minutes Intravenous Once in dialysis 06/15/22 1151 06/16/22 0130   06/14/22 2200  piperacillin-tazobactam (ZOSYN) IVPB 2.25 g  Status:  Discontinued        2.25 g 100 mL/hr over 30 Minutes Intravenous Every 8 hours 06/14/22  1806 06/19/22 1049   06/14/22 1800  ceFEPIme (MAXIPIME) 1 g in sodium chloride 0.9 % 100 mL IVPB  Status:  Discontinued        1 g 200 mL/hr over 30 Minutes Intravenous Every 24 hours 06/14/22 1136 06/14/22 1803   06/14/22 1230  vancomycin (VANCOREADY) IVPB 750 mg/150 mL        750 mg 150 mL/hr over 60 Minutes Intravenous  Once 06/14/22 1142 06/14/22 1901   06/12/22 1230  vancomycin (VANCOREADY) IVPB 750 mg/150 mL        750 mg 150 mL/hr over 60 Minutes Intravenous  Once 06/12/22 1142 06/12/22 1351   06/11/22 2200  vancomycin (VANCOREADY) IVPB 750 mg/150 mL  Status:  Discontinued        750 mg 150 mL/hr over 60 Minutes Intravenous Every M-W-F (Hemodialysis) 06/11/22 1559 06/13/22 2043   06/11/22 1600  ceFEPIme (MAXIPIME) 2 g in sodium chloride 0.9 % 100 mL IVPB  Status:  Discontinued        2 g 200 mL/hr over 30 Minutes  Intravenous Every M-W-F (Hemodialysis) 06/11/22 1312 06/14/22 1136   06/10/22 2300  ceFEPIme (MAXIPIME) 1 g in sodium chloride 0.9 % 100 mL IVPB  Status:  Discontinued        1 g 200 mL/hr over 30 Minutes Intravenous Every 24 hours 06/09/22 2219 06/11/22 1312   06/10/22 1800  vancomycin (VANCOREADY) IVPB 750 mg/150 mL        750 mg 150 mL/hr over 60 Minutes Intravenous Every T-Th-Sa (Hemodialysis) 06/10/22 1302 06/10/22 2030   06/09/22 2230  ceFEPIme (MAXIPIME) 2 g in sodium chloride 0.9 % 100 mL IVPB        2 g 200 mL/hr over 30 Minutes Intravenous  Once 06/09/22 2219 06/10/22 0005   06/09/22 2230  vancomycin (VANCOREADY) IVPB 1750 mg/350 mL        1,750 mg 175 mL/hr over 120 Minutes Intravenous  Once 06/09/22 2219 06/10/22 0327   06/09/22 2219  vancomycin variable dose per unstable renal function (pharmacist dosing)  Status:  Discontinued         Does not apply See admin instructions 06/09/22 2219 06/19/22 1049     Culture/Microbiology    Component Value Date/Time   SDES FLUID PLEURAL 06/18/2022 1351   SPECREQUEST NONE 06/18/2022 1351   CULT  06/18/2022 1351     No growth aerobically or anaerobically. Performed at [facility], [place] [address], [place], [place] [address]    REPTSTATUS 06/23/2022 FINAL 06/18/2022 1351  Radiology Studies: No results found.   LOS: 21 days   Annita Brod, MD [facility]  06/30/2022, 4:15 PM

## 2022-06-30 NOTE — TOC Progression Note (Addendum)
Transition of Care [facility]) - Initial/Assessment Note    Patient Details  Name: [name] MRN: [id number] Date of Birth: 03/10/1941  Transition of Care [facility]) CM/SW Contact:    Milinda Antis, [place] Phone Number: 06/30/2022, 11:24 AM  Clinical Narrative:                 Insurance authorization has been approved through 07/02/2022.  Plan auth number:  [id number].  LCSW contacted the facility and the patient can admit tomorrow.  Patient's daughter notified.    TOC following.   Expected Discharge Plan: Home/Self Care Barriers to Discharge: Continued Medical Work up   Patient Goals and CMS Choice Patient states their goals for this hospitalization and ongoing recovery are:: return home CMS Medicare.gov Compare Post Acute Care list provided to:: Patient Represenative (must comment) Choice offered to / list presented to : Adult Children      Expected Discharge Plan and Services In-house Referral: Clinical Social Work     Living arrangements for the past 2 months: Single Family Home Expected Discharge Date: 06/12/22                         HH Arranged: PT   Date [place]: 06/12/22      Prior Living Arrangements/Services Living arrangements for the past 2 months: Single Family Home Lives with:: Adult Children Patient language and need for interpreter reviewed:: Yes Do you feel safe going back to the place where you live?: Yes      Need for Family Participation in Patient Care: Yes (Comment) Care giver support system in place?: Yes (comment) Current home services: DME Criminal Activity/Legal Involvement Pertinent to Current Situation/Hospitalization: No - Comment as needed  Activities of Daily Living Home Assistive Devices/Equipment: Walker (specify type) ADL Screening (condition at time of admission) Patient's cognitive ability adequate to safely complete daily activities?: Yes Is the patient deaf or have difficulty hearing?: Yes ([place]) Does the  patient have difficulty seeing, even when wearing glasses/contacts?: No Does the patient have difficulty concentrating, remembering, or making decisions?: No Patient able to express need for assistance with ADLs?: Yes Does the patient have difficulty dressing or bathing?: No Independently performs ADLs?: Yes (appropriate for developmental age) Does the patient have difficulty walking or climbing stairs?: Yes Weakness of Legs: Both Weakness of Arms/Hands: None  Permission Sought/Granted                  Emotional Assessment       Orientation: : Oriented to Self, Oriented to Place, Oriented to  Time, Oriented to Situation Alcohol / Substance Use: Not Applicable Psych Involvement: No (comment)  Admission diagnosis:  SOB (shortness of breath) [R06.02] Pleural effusion [J90] Pneumonia due to infectious organism, unspecified laterality, unspecified part of lung [J18.9] Patient Active Problem List   Diagnosis Date Noted   Acute blood loss anemia 06/22/2022   Hemothorax 06/22/2022   Hemothorax on left 06/21/2022   Loculated pleural effusion 06/21/2022   Need for management of chest tube 06/20/2022   Delirium 06/14/2022   GERD (gastroesophageal reflux disease) 06/10/2022   CAP (community acquired pneumonia) 06/10/2022   ESRD (end stage renal disease) ([place]) 06/09/2022   DMII (diabetes mellitus, type 2) ([place]) 06/09/2022   Multifocal pneumonia 06/02/2022   Pleural effusion 06/02/2022   End stage renal disease on dialysis [facility]) 06/02/2022   Spinal stenosis of lumbar region 01/07/2022   Schatzki's ring 10/15/2021   Esophageal dysphagia 10/15/2021   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   Hypertensive heart and CKD, ESRD on dialysis ([place]) 11/09/2020   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 due to type 2 diabetes mellitus ([place]) 11/09/2020   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 diabetes mellitus ([place]) 11/09/2020   Dependence on renal dialysis ([place])  11/09/2020   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 disease ([place]) 11/09/2020   Hyperlipidemia associated with type 2 diabetes mellitus ([place]) 11/09/2020   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 ([place]) 11/09/2020   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 ([place]) 11/09/2020   Chronic constipation 11/09/2020   Vitamin B 12 deficiency 11/09/2020   Acute respiratory failure with hypoxia ([place]) 10/26/2020   Elevated troponin 10/26/2020   Volume overload 10/26/2020   Hypertensive urgency 10/26/2020   History of CVA (cerebrovascular accident) 01/05/2020   Thrombocytopenia ([place]) 01/05/2020   History of colonic polyps 04/01/2018   Acute bronchitis 10/15/2017   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 ([place])    HCAP (healthcare-associated pneumonia) 05/29/2015   Hyperkalemia 05/29/2015   ESRD (end stage renal disease) on dialysis ([place])    Gastro-esophageal reflux disease without esophagitis 09/29/2014   Encounter for adequacy testing for hemodialysis ([place]) 01/17/2014   Mechanical complication of other vascular device, implant, and graft 01/17/2014   Disturbances of vision, late effect of stroke 11/03/2013   Right hemiparesis ([place]) 08/19/2013   CVA (cerebral infarction) 07/20/2013   Gastroparesis due to DM ([place]) 07/01/2013   Type 2 diabetes mellitus with hyperglycemia ([place]) 06/23/2013   Fall 05/25/2012   HLD (hyperlipidemia) 05/20/2006   Hereditary and idiopathic peripheral neuropathy 05/20/2006   Essential hypertension 05/20/2006   PCP:  [name], MD Pharmacy:  No Pharmacies Listed    Social Determinants of Health (SDOH) Social History: SDOH Screenings   Food Insecurity: No Food Insecurity (06/13/2022)  Housing: Low Risk  (06/13/2022)  Transportation Needs: No Transportation Needs (06/13/2022)  Utilities: Not At Risk (06/13/2022)  Tobacco Use: Low Risk  (06/10/2022)   SDOH Interventions:     Readmission Risk  Interventions    06/12/2022    3:57 PM 06/10/2022   11:01 AM 01/17/2020    9:17 AM  Readmission Risk Prevention Plan  Transportation Screening Complete Complete Complete  PCP or Specialist Appt within 5-7 Days   Not Complete  Not Complete comments   pending disposition  Home Care Screening   Complete  Medication Review (RN CM)   Referral to Pharmacy  [facility] or [address] Complete Complete   Social Work Consult for [place] Planning/Counseling Complete Complete   Palliative Care Screening Not Applicable Not Applicable   Medication Review Press [employer]) Complete Complete

## 2022-06-30 NOTE — Progress Notes (Signed)
PT Cancellation Note  Patient Details Name: [name] MRN: [id number] DOB: 1940-09-14   Cancelled Treatment:    Reason Eval/Treat Not Completed: Patient at procedure or test/unavailable Has been off unit for dialysis this morning. Will follow-up for PT as schedule allows.   Ellouise Newer 06/30/2022, 10:35 AM

## 2022-06-30 NOTE — Progress Notes (Signed)
Mobility Specialist Progress Note:    06/30/22 1400  Mobility  Activity Transferred to/from [facility]  Level of Assistance Minimal assist, patient does 75% or more  Assistive Device  (HHA)  Distance Ambulated (ft) 2 ft  Activity Response Tolerated well  Mobility Referral Yes  $Mobility charge 1 Mobility   Pt was agreeable for mobility session. Required MinA to stand. Tolerated well, asx throughout. Left pt with chair alarm on, call bell in reach, and all needs met.   Royetta Crochet Mobility Specialist Please contact via Solicitor or  Rehab office at [phone]

## 2022-06-30 NOTE — Progress Notes (Signed)
   06/30/22 1210  Vitals  Temp 98.3 F (36.8 C)  Temp Source Oral  BP (!) 134/56  MAP (mmHg) 81  BP Location Left Arm  BP Method Automatic  Patient Position (if appropriate) Lying  Pulse Rate 93  Pulse Rate Source Monitor  ECG Heart Rate 93  Resp 14  Oxygen Therapy  SpO2 100 %  O2 Device Room Air  Pulse Oximetry Type Continuous   Received patient in bed to unit.  Alert and oriented.  Informed consent signed and in chart.   Treatment initiated: 0818 Treatment completed: 1151  Patient tolerated well.  Transported back to the room  Alert, without acute distress.  Hand-off given to patient's nurse.   Access used: AVF Access issues: NA  Total UF removed: 1049m Medication(s) given: NA Post HD VS: see above Post HD weight: 64.6kg   [facility]

## 2022-07-01 DIAGNOSIS — E875 Hyperkalemia: Secondary | ICD-10-CM | POA: Diagnosis not present

## 2022-07-01 DIAGNOSIS — D696 Thrombocytopenia, unspecified: Secondary | ICD-10-CM | POA: Diagnosis not present

## 2022-07-01 DIAGNOSIS — Z992 Dependence on renal dialysis: Secondary | ICD-10-CM | POA: Diagnosis not present

## 2022-07-01 DIAGNOSIS — E119 Type 2 diabetes mellitus without complications: Secondary | ICD-10-CM | POA: Diagnosis not present

## 2022-07-01 DIAGNOSIS — M19011 Primary osteoarthritis, right shoulder: Secondary | ICD-10-CM | POA: Diagnosis not present

## 2022-07-01 DIAGNOSIS — Z7982 Long term (current) use of aspirin: Secondary | ICD-10-CM | POA: Diagnosis not present

## 2022-07-01 DIAGNOSIS — I639 Cerebral infarction, unspecified: Secondary | ICD-10-CM | POA: Diagnosis not present

## 2022-07-01 DIAGNOSIS — Z86010 Personal history of colonic polyps: Secondary | ICD-10-CM | POA: Diagnosis not present

## 2022-07-01 DIAGNOSIS — I120 Hypertensive chronic kidney disease with stage 5 chronic kidney disease or end stage renal disease: Secondary | ICD-10-CM | POA: Diagnosis not present

## 2022-07-01 DIAGNOSIS — I10 Essential (primary) hypertension: Secondary | ICD-10-CM | POA: Diagnosis not present

## 2022-07-01 DIAGNOSIS — R293 Abnormal posture: Secondary | ICD-10-CM | POA: Diagnosis not present

## 2022-07-01 DIAGNOSIS — R269 Unspecified abnormalities of gait and mobility: Secondary | ICD-10-CM | POA: Diagnosis not present

## 2022-07-01 DIAGNOSIS — E1169 Type 2 diabetes mellitus with other specified complication: Secondary | ICD-10-CM | POA: Diagnosis not present

## 2022-07-01 DIAGNOSIS — J942 Hemothorax: Secondary | ICD-10-CM | POA: Diagnosis not present

## 2022-07-01 DIAGNOSIS — R0602 Shortness of breath: Secondary | ICD-10-CM | POA: Diagnosis not present

## 2022-07-01 DIAGNOSIS — M48061 Spinal stenosis, lumbar region without neurogenic claudication: Secondary | ICD-10-CM | POA: Diagnosis not present

## 2022-07-01 DIAGNOSIS — R079 Chest pain, unspecified: Secondary | ICD-10-CM | POA: Diagnosis not present

## 2022-07-01 DIAGNOSIS — G8191 Hemiplegia, unspecified affecting right dominant side: Secondary | ICD-10-CM | POA: Diagnosis not present

## 2022-07-01 DIAGNOSIS — Z7401 Bed confinement status: Secondary | ICD-10-CM | POA: Diagnosis not present

## 2022-07-01 DIAGNOSIS — M6281 Muscle weakness (generalized): Secondary | ICD-10-CM | POA: Diagnosis not present

## 2022-07-01 DIAGNOSIS — E785 Hyperlipidemia, unspecified: Secondary | ICD-10-CM | POA: Diagnosis not present

## 2022-07-01 DIAGNOSIS — Z89411 Acquired absence of right great toe: Secondary | ICD-10-CM | POA: Diagnosis not present

## 2022-07-01 DIAGNOSIS — D649 Anemia, unspecified: Secondary | ICD-10-CM | POA: Diagnosis not present

## 2022-07-01 DIAGNOSIS — E1122 Type 2 diabetes mellitus with diabetic chronic kidney disease: Secondary | ICD-10-CM | POA: Diagnosis not present

## 2022-07-01 DIAGNOSIS — J90 Pleural effusion, not elsewhere classified: Secondary | ICD-10-CM | POA: Diagnosis not present

## 2022-07-01 DIAGNOSIS — E1142 Type 2 diabetes mellitus with diabetic polyneuropathy: Secondary | ICD-10-CM | POA: Diagnosis not present

## 2022-07-01 DIAGNOSIS — F32A Depression, unspecified: Secondary | ICD-10-CM | POA: Diagnosis not present

## 2022-07-01 DIAGNOSIS — D3A8 Other benign neuroendocrine tumors: Secondary | ICD-10-CM | POA: Diagnosis not present

## 2022-07-01 DIAGNOSIS — R0902 Hypoxemia: Secondary | ICD-10-CM | POA: Diagnosis not present

## 2022-07-01 DIAGNOSIS — W19XXXA Unspecified fall, initial encounter: Secondary | ICD-10-CM | POA: Diagnosis not present

## 2022-07-01 DIAGNOSIS — R0789 Other chest pain: Secondary | ICD-10-CM | POA: Diagnosis not present

## 2022-07-01 DIAGNOSIS — M25519 Pain in unspecified shoulder: Secondary | ICD-10-CM | POA: Diagnosis not present

## 2022-07-01 DIAGNOSIS — R531 Weakness: Secondary | ICD-10-CM | POA: Diagnosis not present

## 2022-07-01 DIAGNOSIS — D631 Anemia in chronic kidney disease: Secondary | ICD-10-CM | POA: Diagnosis not present

## 2022-07-01 DIAGNOSIS — Z8673 Personal history of transient ischemic attack (TIA), and cerebral infarction without residual deficits: Secondary | ICD-10-CM | POA: Diagnosis not present

## 2022-07-01 DIAGNOSIS — D62 Acute posthemorrhagic anemia: Secondary | ICD-10-CM | POA: Diagnosis not present

## 2022-07-01 DIAGNOSIS — R410 Disorientation, unspecified: Secondary | ICD-10-CM | POA: Diagnosis not present

## 2022-07-01 DIAGNOSIS — E1165 Type 2 diabetes mellitus with hyperglycemia: Secondary | ICD-10-CM | POA: Diagnosis not present

## 2022-07-01 DIAGNOSIS — N250 Renal osteodystrophy: Secondary | ICD-10-CM | POA: Diagnosis not present

## 2022-07-01 DIAGNOSIS — Z794 Long term (current) use of insulin: Secondary | ICD-10-CM | POA: Diagnosis not present

## 2022-07-01 DIAGNOSIS — E1151 Type 2 diabetes mellitus with diabetic peripheral angiopathy without gangrene: Secondary | ICD-10-CM | POA: Diagnosis not present

## 2022-07-01 DIAGNOSIS — K219 Gastro-esophageal reflux disease without esophagitis: Secondary | ICD-10-CM | POA: Diagnosis not present

## 2022-07-01 DIAGNOSIS — E782 Mixed hyperlipidemia: Secondary | ICD-10-CM | POA: Diagnosis not present

## 2022-07-01 DIAGNOSIS — N186 End stage renal disease: Secondary | ICD-10-CM | POA: Diagnosis not present

## 2022-07-01 LAB — GLUCOSE, CAPILLARY
Glucose-Capillary: 157 mg/dL — ABNORMAL HIGH (ref 70–99)
Glucose-Capillary: 174 mg/dL — ABNORMAL HIGH (ref 70–99)

## 2022-07-01 MED ORDER — INSULIN ASPART PROT & ASPART (70-30 MIX) 100 UNIT/ML ~~LOC~~ SUSP: 3.0000 [IU] | Freq: Two times a day (BID) | SUBCUTANEOUS | 0 refills | Status: DC

## 2022-07-01 MED ORDER — QUETIAPINE FUMARATE 25 MG PO TABS: 12.5000 mg | ORAL_TABLET | Freq: Every day | ORAL | Status: DC

## 2022-07-01 NOTE — Progress Notes (Signed)
Physical Therapy Treatment Patient Details Name: [name] MRN: [id number] DOB: 1941-04-03 Today's Date: 07/01/2022   History of Present Illness 82 y.o. female admitted on 06/07/2022 with left-sided pleural effusion, pneumonia and dyspnea. PMHx: ESRD, diabetes mellitus type 2, GERD, hypertension, history of stroke.  New OT order placed 1/9 for consideration of post acute rehab.    PT Comments    Tolerated treatment well with minor redirection throughout. Min assist for transfer and gait at [facility] level with RW. Required 3 standing rest breaks to complete distance of 120 feet. Cues for safety, posture, and walker control intermittently. Patient will continue to benefit from skilled physical therapy services to further improve independence with functional mobility.    Recommendations for follow up therapy are one component of a multi-disciplinary discharge planning process, led by the attending physician.  Recommendations may be updated based on patient status, additional functional criteria and insurance authorization.  Follow Up Recommendations  Skilled nursing-short term rehab (<3 hours/day) (Pt going to SNF for rehab - family unable to provide care.) Can patient physically be transported by private vehicle: Yes   Assistance Recommended at Discharge Frequent or constant Supervision/Assistance  Patient can return home with the following Help with stairs or ramp for entrance;Assist for transportation;A little help with walking and/or transfers;A little help with bathing/dressing/bathroom;Assistance with cooking/housework;Direct supervision/assist for medications management;Direct supervision/assist for financial management   Equipment Recommendations  Rolling walker (2 wheels);BSC/3in1    Recommendations for Other Services       Precautions / Restrictions Precautions Precautions: Fall Restrictions Weight Bearing Restrictions: No     Mobility  Bed Mobility                General bed mobility comments: in recliner    Transfers Overall transfer level: Needs assistance Equipment used: Rolling walker (2 wheels) Transfers: Sit to/from Stand Sit to Stand: Min assist           General transfer comment: Very light assist to rise from chair. heavily flexed trunk while rising but able to use RW for support upon standing.    Ambulation/Gait Ambulation/Gait assistance: Min assist Gait Distance (Feet): 120 Feet Assistive device: Rolling walker (2 wheels) Gait Pattern/deviations: Decreased step length - right, Decreased step length - left, Decreased stride length, Trunk flexed, Step-through pattern Gait velocity: decreased Gait velocity interpretation: <1.31 ft/sec, indicative of household ambulator   General Gait Details: Cues for upright posture,forward gaze, keeps head down at the floor majority of distance. Slight drift from path at times.Cues for awareness. Required 3 standing rest breaks to complete distance. Very fatigued at end of session. CGA at all times however no buckling noted during bout.   Stairs             Wheelchair Mobility    Modified Rankin (Stroke Patients Only)       Balance Overall balance assessment: Needs assistance Sitting-balance support: Feet supported, No upper extremity supported Sitting balance-Leahy Scale: Fair   Postural control: Posterior lean Standing balance support: Bilateral upper extremity supported Standing balance-Leahy Scale: Poor                              Cognition Arousal/Alertness: Awake/alert Behavior During Therapy: WFL for tasks assessed/performed Overall Cognitive Status: No family/caregiver present to determine baseline cognitive functioning Area of Impairment: Attention, Awareness, Orientation, Following commands, Safety/judgement, Problem solving  Orientation Level: Disoriented to, Place, Time, Situation (aware of location) Current Attention Level:  Focused   Following Commands: Follows one step commands inconsistently, Follows one step commands with increased time Safety/Judgement: Decreased awareness of deficits, Decreased awareness of safety Awareness: Intellectual Problem Solving: Slow processing, Decreased initiation, Requires verbal cues          Exercises General Exercises - Lower Extremity Ankle Circles/Pumps: AROM, Both, 10 reps, Seated Quad Sets: Strengthening, Both, 10 reps, Seated Gluteal Sets: Strengthening, Both, 10 reps, Seated Short Arc Quad: Strengthening, Both, 10 reps, Seated    General Comments General comments (skin integrity, edema, etc.): Very fatigued at end of distance.      Pertinent Vitals/Pain Pain Assessment Pain Assessment: No/denies pain Pain Intervention(s): Monitored during session    Home Living                          Prior Function            PT Goals (current goals can now be found in the care plan section) Acute Rehab PT Goals Patient Stated Goal: Go home PT Goal Formulation: With patient/family Time For Goal Achievement: 07/09/22 Potential to Achieve Goals: Fair Progress towards PT goals: Progressing toward goals    Frequency    Min 3X/week      PT Plan Discharge plan needs to be updated    Co-evaluation              AM-PAC PT "6 Clicks" Mobility   Outcome Measure  Help needed turning from your back to your side while in a flat bed without using bedrails?: A Little Help needed moving from lying on your back to sitting on the side of a flat bed without using bedrails?: A Little Help needed moving to and from a bed to a chair (including a wheelchair)?: A Little Help needed standing up from a chair using your arms (e.g., wheelchair or bedside chair)?: A Little Help needed to walk in hospital room?: A Little Help needed climbing 3-5 steps with a railing? : A Lot 6 Click Score: 17    End of Session Equipment Utilized During Treatment: Gait  belt Activity Tolerance: Patient tolerated treatment well Patient left: with call bell/phone within reach;in chair;with chair alarm set   PT Visit Diagnosis: Unsteadiness on feet (R26.81);Other abnormalities of gait and mobility (R26.89);Muscle weakness (generalized) (M62.81);Difficulty in walking, not elsewhere classified (R26.2)     Time: 8546-2703 PT Time Calculation (min) (ACUTE ONLY): 13 min  Charges:  $Gait Training: 8-22 mins                     Candie Mile, PT, DPT Physical Therapist Acute Rehabilitation Services [place]    Ellouise Newer 07/01/2022, 3:11 PM

## 2022-07-01 NOTE — Progress Notes (Signed)
Patient ID: [name], female   DOB: [birth year], 82 y.o.   MRN: [id number]  [facility] Progress Note    Subjective:   Feels well, no complaints.   Objective:   BP 113/64 (BP Location: Left Arm)   Pulse (!) 101   Temp (!) 97.5 F (36.4 C) (Oral)   Resp 17   Ht '5\' 7"'$  (1.702 m)   Wt 64.6 kg   SpO2 99%   BMI 22.31 kg/m   Intake/Output: I/O last 3 completed shifts: In: 640 [P.O.:640] Out: 1000 [Other:1000]   Intake/Output this shift:  Total I/O In: 120 [P.O.:120] Out: -  Weight change:   Physical Exam: Gen: NAD CVS: tachy Resp: CTA Abd: +BS, soft, NT/ND Ext: no edema, RUE AVF +T/B  Labs: BMET Recent Labs  Lab 06/25/22 0851 06/26/22 0636 06/27/22 0754 06/30/22 0710  NA 134* 135 135 133*  K 4.9 4.8 4.9 4.5  CL 93* 97* 97* 93*  CO2 '28 27 27 28  '$ GLUCOSE 123* 114* 206* 277*  BUN 29* '11 20 21  '$ CREATININE 7.63* 4.59* 6.52* 7.71*  ALBUMIN 2.2* 2.3* 2.1* 2.3*  CALCIUM 7.4* 7.4* 7.2* 7.9*  PHOS 3.2 3.3 3.3 2.3*   CBC Recent Labs  Lab 06/25/22 0851 06/26/22 0636 06/27/22 0754 06/30/22 0710  WBC 9.5 12.3* 11.4* 9.3  HGB 7.7* 8.3* 8.0* 8.7*  HCT 23.0* 25.1* 24.0* 26.5*  MCV 94.7 95.4 93.8 94.3  PLT 187 182 167 180      Medications:     Chlorhexidine Gluconate Cloth  6 each Topical Q0600   darbepoetin (ARANESP) injection - DIALYSIS  60 mcg Subcutaneous Q Mon-1800   dextromethorphan-guaiFENesin  1 tablet Oral BID   hydrocerin   Topical BID   insulin aspart protamine- aspart  3 Units Subcutaneous BID WC   pantoprazole  40 mg Oral Daily   QUEtiapine  12.5 mg Oral QHS   sevelamer carbonate  800 mg Oral BID AC   Dialyzes at [facility] MWF  4 hours  EDW 75.5 ( getting to it and cramping-  got to 73 on 11/24 when here) . HD Bath 2K, Dialyzer unknown, Heparin no. Access AVF right-  has post bleeding a lot.  Calcitriol 1 mcg q tx  Assessment/ Plan:   Acute hypoxic respiratory failure - due to loculated left-sided pleural  effusion.  S/p pleural drain on 06/18/22 and thrombolytic therapy on 06/20/22.  Complicated by hemothorax s/p chest tube placement which has been removed over a week ago.  Doing much better. ESRD continue with HD on MWF schedule.  H/o hypotension and will resume midodrine due to dropping bp with HD yesterday. Anemia of ESRD and ABLA :continue with ESA and transfuse prn. 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  Resume midodrine pre dialysis. Disposition - awaiting SNF placement  Donetta Potts, MD [place] 07/01/2022, 10:55 AM

## 2022-07-01 NOTE — Progress Notes (Signed)
Mobility Specialist Progress Note   07/01/22 1050  Mobility  Activity Ambulated independently to bathroom;Ambulated with assistance in hallway (in recliner before and after)  Level of Assistance Contact guard assist, steadying assist  Assistive Device Front wheel walker  Distance Ambulated (ft) 60 ft  Range of Motion/Exercises Active;All extremities  Activity Response Tolerated well   Patient received in recliner and agreeable to participate. Requested assistance to bathroom first before ambulating in hallway. Stood with minimal HHA, more so assisting with balance secondary to posterior LOB with standing. Ambulated to and from bathroom and in hallway at min guard with slow steady gait. Required seated rest break before ambulating in hallway. Returned to room without complaint or incident. Was left in recliner with all needs met, call bell in reach.   [place] Jaicee Michelotti, BS EXP Mobility Specialist Please contact via SecureChat or Rehab office at [phone]

## 2022-07-01 NOTE — Consult Note (Signed)
   [facility] CM Inpatient Consult   07/01/2022  [name] 08/24/1940 [id number]  Follow Up: Transition to SNF  Patient was followed for LLOS and disposition.  HD noted at [facility] and patient to transition to [facility] for SNF rehab.  Plan: If tis patient transitions to this facility will alert the [facility] RN of post facility care coordination needs.  For questions, please contact:  Natividad Brood, RN BSN [place]  [phone] business mobile phone Toll free office [phone]  *[place]  [phone] Fax number: [phone] [name]

## 2022-07-01 NOTE — Progress Notes (Signed)
Pt to d/c to snf today. Contacted [facility] and spoke to [place]. Clinic aware pt to d/c to snf today and will resume care tomorrow. Clinic also provided snf name that pt will be admitting to at d/c. D/C summary and last renal note faxed to clinic to continuation of care.   Melven Sartorius Renal Navigator [phone]

## 2022-07-01 NOTE — Progress Notes (Signed)
[place] to give report. Was not able to give no answer from the RN. Will try again later.

## 2022-07-01 NOTE — Progress Notes (Signed)
DISCHARGE NOTE SNF [name] to be discharged Skilled nursing facility per MD order. Patient verbalized understanding.  Skin clean, dry and intact without evidence of skin break down, no evidence of skin tears noted. IV catheter discontinued intact. Site without signs and symptoms of complications. Dressing and pressure applied. Pt denies pain at the site currently. No complaints noted.  Patient free of lines, drains, and wounds.   Discharge packet assembled. An After Visit Summary (AVS) was printed and given to the EMS personnel. Patient escorted via stretcher and discharged to [facility] via ambulance. Report called to accepting facility; all questions and concerns addressed.   Arlyss Repress, RN

## 2022-07-01 NOTE — Discharge Summary (Signed)
Physician Discharge Summary  [name] QQP:[id number] DOB: 09/10/1940 DOA: 06/09/2022  PCP: [name], MD  Admit date: 06/09/2022 Discharge date: 07/01/2022 Recommendations for Outpatient Follow-up:  Follow up with PCP in 1 weeks-call for appointment Please obtain BMP/CBC in one week  Discharge Dispo: SNF Discharge Condition: Stable Code Status:   Code Status: Full Code Diet recommendation:  Diet Order             Diet Heart Room service appropriate? Yes; Fluid consistency: Thin  Diet effective now                    Brief/Interim Summary:  82 y.o. female with a history of ESRD, diabetes mellitus type 2, GERD, hypertension, stroke admitted on 12/25 for recurrent pneumonia with associated left sided pleural effusion. Empiric antibiotics initiated and patient transferred to [facility] for chest tube placement. During admission, patient developed hospital delirium.  Patient was seen by nephrology continue on hemodialysis.  Status post 43 French pleural drain placed by IR 1/3, followed by PCCM, s/p thrombolytic instillation on 1/4.   Chest tube able to be removed 1/8.  Nephrology following for ongoing dialysis.  Hospitalization complicated by delirium which has since improved.  She also had hypoglycemia in the hypotensive episode initially needed midodrine, she is back on insulin regimen at this time she remains medically stable for discharge to skilled nursing facility pending placement- SNF bed available 07/01/22, and is being discharged in medically stable condition    Discharge Diagnoses:  Principal Problem:   Pleural effusion Active Problems:   Essential hypertension   Thrombocytopenia (HCC)   ESRD (end stage renal disease) ([place])   DMII (diabetes mellitus, type 2) ([place])   GERD (gastroesophageal reflux disease)   CAP (community acquired pneumonia)   Delirium   Need for management of chest tube   Hemothorax on left   Loculated pleural effusion   Acute  blood loss anemia   Hemothorax  Recurrent left exudative pleural effusion Left hemothorax after intrapleural Lytics: Effusion from multifocal pneumonia in mid December.  Cultures negative MRSA swab was negative.status post chest tube placement on 1/3 - 1/8 with thrombolytics instilled.  Patient now stable on room air. Acute blood loss anemia in the setting of chronic anemia: Improved after transfusion. Delirium-resolved:In the setting of history of multiple vascular injuries and current hospitalization. Likely hospital related delirium.  Improved, CT head unremarkable continue Seroquel at nighttime, continue fall precautions supportive care delirium precaution.  She is alert and oriented x 2 at baseline    GERD:continue PPI   T2DM with hypoglycemia: No more hypoglycemia.  Patient is placed back on 70/30 insulin units twice daily at lower-dose cont same    ESRD on HD MWF >Nephrology following Hypotension-resolved History of hypertension: Occurred on night of 1/10.  Labetalol discontinued given IV fluids, stared on Midodrine 5 MG ITD> decreased to 2.5 mg TID 1/13> changed to prn 1/14 and stopped.  Thrombocytopenia: Improved.  Monitor Chest pain resolved likely due to hypertension   Deconditioning debility weakness: Seen by PT and OT for skilled nursing Consults: Pulmonary, nephrology Subjective: Alert awake oriented resting comfortably on the bedside chair.  No complaints.  Blood sugar and BP stable.   Discharge Exam: Vitals:   07/01/22 0512 07/01/22 0818  BP: 103/71 113/64  Pulse: (!) 101 (!) 101  Resp: 18 17  Temp: 98.1 F (36.7 C) (!) 97.5 F (36.4 C)  SpO2: 99% 99%   General: Pt is alert, awake, not in  acute distress Cardiovascular: RRR, S1/S2 +, no rubs, no gallops Respiratory: CTA bilaterally, no wheezing, no rhonchi Abdominal: Soft, NT, ND, bowel sounds + Extremities: no edema, no cyanosis  Discharge Instructions  Discharge Instructions     AMB Referral to  Community Care Coordinaton   Complete by: As directed    Hospital Follow up referral/Care Coordination:  High risk for readmission less than 7 days readmission  Primary Care Provider: [name], MD  Insurance plan: [facility]  Please assign to [place] for post hospital care coordination for readmission prevention follow up calls and assess for further needs.  Questions please call:   Natividad Brood, RN BSN [place]  [phone] business mobile phone Toll free office [phone]  *[place]  [phone] Fax number: [phone] [name]   Reason for Referral: Care Coordination ([facility] patients)   Disease managment services needed: Nurse Case Manager   Diagnoses of: COPD/ Pneumonia   Expected date of contact: Emergent - 3 Days   Discharge instructions   Complete by: As directed    Please call call MD or return to ER for similar or worsening recurring problem that brought you to hospital or if any fever,nausea/vomiting,abdominal pain, uncontrolled pain, chest pain,  shortness of breath or any other alarming symptoms.  Please follow-up your doctor as instructed in a week time and call the office for appointment.  Please avoid alcohol, smoking, or any other illicit substance and maintain healthy habits including taking your regular medications as prescribed.  You were cared for by a hospitalist during your hospital stay. If you have any questions about your discharge medications or the care you received while you were in the hospital after you are discharged, you can call the unit and ask to speak with the hospitalist on call if the hospitalist that took care of you is not available.  Once you are discharged, your primary care physician will handle any further medical issues. Please note that NO REFILLS for any discharge medications will be  authorized once you are discharged, as it is imperative that you return to your primary care physician (or establish a relationship with a primary care physician if you do not have one) for your aftercare needs so that they can reassess your need for medications and monitor your lab values   Increase activity slowly   Complete by: As directed       Allergies as of 07/01/2022       Reactions   Ambien [zolpidem] Other (See Comments)   Hallucinations    Reglan [metoclopramide] Other (See Comments)   "makes me crazy"        Medication List     STOP taking these medications    dextromethorphan-guaiFENesin 30-600 MG 12hr tablet Commonly known as: MUCINEX DM   HumuLIN 70/30 (70-30) 100 UNIT/ML injection Generic drug: insulin NPH-regular Human Replaced by: insulin aspart protamine- aspart (70-30) 100 UNIT/ML injection   labetalol 200 MG tablet Commonly known as: NORMODYNE       TAKE these medications    albuterol 108 (90 Base) MCG/ACT inhaler Commonly known as: VENTOLIN HFA Inhale 2 puffs into the lungs every 6 (six) hours as needed for wheezing or shortness of breath.   aspirin 325 MG tablet Take 650 mg by mouth daily.   BD Insulin Syringe U/F 31G X 5/16" 0.3 ML Misc Generic drug: Insulin Syringe-Needle U-100 Inject 1 each into the skin 2 (two) times daily. as directed  benzonatate 100 MG capsule Commonly known as: TESSALON Take 1 capsule (100 mg total) by mouth 3 (three) times daily as needed for cough.   cyanocobalamin 1000 MCG/ML injection Commonly known as: VITAMIN B12 Inject 1,000 mcg into the muscle every 30 (thirty) days.   diclofenac Sodium 1 % Gel Commonly known as: Voltaren Apply 4 g topically 4 (four) times daily as needed. Apply to bilateral knees   insulin aspart protamine- aspart (70-30) 100 UNIT/ML injection Commonly known as: NOVOLOG MIX 70/30 Inject 0.03 mLs (3 Units total) into the skin 2 (two) times daily with a meal. Replaces: HumuLIN 70/30  (70-30) 100 UNIT/ML injection   multivitamin Tabs tablet Take 1 tablet by mouth at bedtime.   omeprazole 40 MG capsule Commonly known as: PRILOSEC TAKE ONE CAPSULE BY MOUTH TWICE DAILY BEFORE A meal What changed: See the new instructions.   QUEtiapine 25 MG tablet Commonly known as: SEROQUEL Take 0.5 tablets (12.5 mg total) by mouth at bedtime.   sevelamer carbonate 800 MG tablet Commonly known as: RENVELA Take 1 tablet (800 mg total) by mouth 2 (two) times daily before lunch and supper.               Durable Medical Equipment  (From admission, onward)           Start     Ordered   06/17/22 1444  For home use only DME Bedside commode  Once       Comments: Pt's generalized weakness and decreased activity tolerance necessitate recommendation for 3n1 to use as shower seat at home for safe completion of full showering/bathing tasks.  Question:  Patient needs a bedside commode to treat with the following condition  Answer:  Weakness   06/17/22 1444            Follow-up Information     [name], MD Follow up in 1 week(s).   Specialty: Family Medicine Contact information: [address] [place] [address] [phone]                Allergies  Allergen Reactions   Ambien [Zolpidem] Other (See Comments)    Hallucinations    Reglan [Metoclopramide] Other (See Comments)    "makes me crazy"    The results of significant diagnostics from this hospitalization (including imaging, microbiology, ancillary and laboratory) are listed below for reference.    Microbiology: No results found for this or any previous visit (from the past 240 hour(s)).  Procedures/Studies: CT CHEST W CONTRAST  Result Date: 06/23/2022 CLINICAL DATA:  Pneumonia, complication suspected. EXAM: CT CHEST WITH CONTRAST TECHNIQUE: Multidetector CT imaging of the chest was performed during intravenous contrast administration. RADIATION DOSE REDUCTION: This exam was performed  according to the departmental dose-optimization program which includes automated exposure control, adjustment of the mA and/or kV according to patient size and/or use of iterative reconstruction technique. CONTRAST:  40m OMNIPAQUE IOHEXOL 350 MG/ML SOLN COMPARISON:  06/13/2022. FINDINGS: Cardiovascular: The heart is enlarged and there is a small pericardial effusion. Three-vessel coronary artery calcifications are noted. There is atherosclerotic calcification of the aorta without evidence of aneurysm. The pulmonary trunk is distended suggesting underlying pulmonary artery hypertension. Mediastinum/Nodes: No mediastinal, hilar, or axillary lymphadenopathy by size criteria. The thyroid gland, trachea, and esophagus are within normal limits. Lungs/Pleura: There is a small to moderate loculated pleural effusion on the left, decreased from the prior exam. A chest tube terminates in the collection in the left lower lobe. Bronchial wall thickening is  noted in the left lower lobe. Strandy atelectasis or infiltrate in the left upper and lower lobes, decreased from the prior exam. Dependent atelectasis is noted on the right. No pneumothorax. Upper Abdomen: Scattered diverticula are present along the colon. There is bilateral renal atrophy with cortical thinning and possible cysts. No acute abnormality. Musculoskeletal: Degenerative changes are present in the thoracic spine. No acute or suspicious osseous abnormality. A vascular stent is noted in the right upper extremity. IMPRESSION: 1. Small loculated pleural effusion on the left with a chest tube in place and decreased in size from the prior exam. 2. Decreased airspace opacities in the left lung. 3. Coronary artery calcifications and aortic atherosclerosis. Electronically Signed   By: Brett Fairy M.D.   On: 06/23/2022 01:53   DG CHEST PORT 1 VIEW  Result Date: 06/21/2022 CLINICAL DATA:  Chest tube placement EXAM: PORTABLE CHEST 1 VIEW COMPARISON:  June 20, 2022  FINDINGS: The left chest tube is stable. Opacity and probable loculated effusion on the left are similar in the interval. No pneumothorax. The cardiomediastinal silhouette is unchanged. The right lung remains clear. IMPRESSION: 1. The left chest tube remains in place. No pneumothorax. 2. The opacity and loculated effusion on the left are similar in the interval. Electronically Signed   By: Dorise Bullion III M.D.   On: 06/21/2022 08:39   DG Chest Port 1 View  Result Date: 06/20/2022 CLINICAL DATA:  Assess left chest tube. EXAM: PORTABLE CHEST 1 VIEW COMPARISON:  June 19, 2022 FINDINGS: Stable left basilar opacity and effusion. Stable left chest tube. The cardiomediastinal silhouette is unchanged. No pneumothorax. No other interval changes. IMPRESSION: 1. Stable left chest tube. No pneumothorax. 2. Stable left basilar opacity and effusion. Electronically Signed   By: Dorise Bullion III M.D.   On: 06/20/2022 08:17   DG Chest 1 View  Result Date: 06/19/2022 CLINICAL DATA:  Left pleural effusion, chest pain EXAM: CHEST  1 VIEW COMPARISON:  Previous studies including the examination done earlier today FINDINGS: Transverse diameter of heart is slightly increased. There are no signs of pulmonary edema. There is interval improvement in the aeration in left mid and left lower lung fields suggesting significant decrease in left pleural effusion. Still, there is small to moderate residual effusion part of which appears to be along the lateral aspect of left mid lung field. Patchy infiltrates are seen in left lower lung field. There is no pneumothorax. Left chest tube is noted with its tip in the medial left lower lung field. Loop recorder is seen in left chest wall. Vascular stents are seen in right upper arm. IMPRESSION: There is partial clearing of left pleural effusion. Patchy infiltrate in left lower lung fields suggests atelectasis/pneumonia. Electronically Signed   By: Elmer Picker M.D.   On: 06/19/2022  15:58   DG Chest Port 1 View  Result Date: 06/19/2022 CLINICAL DATA:  Chest tube placement EXAM: PORTABLE CHEST 1 VIEW COMPARISON:  Chest 06/12/2022 FINDINGS: Interval placement of pigtail drainage tube in the left lung base. Moderately large left pleural effusion unchanged. Left lower lobe consolidation unchanged. Mild progression of left upper lobe airspace no pneumothorax Right lung remains clear. Cardiac loop recorder noted. Cardiac enlargement without heart failure. IMPRESSION: Interval placement of pigtail drainage tube in the left lung base. No change in left lower lobe airspace disease. Mild progression of left upper lobe airspace disease. Moderately large left pleural effusion unchanged. Electronically Signed   By: Franchot Gallo M.D.   On: 06/19/2022 07:43  CT [facility] PLEURAL DRAIN W/INDWELL CATH W/IMG GUIDE  Result Date: 06/18/2022 INDICATION: Loculated LEFT pleural effusion. EXAM: CT-GUIDED LEFT THORACOSTOMY DRAINAGE TUBE PLACEMENT COMPARISON:  CT CHEST, 06/13/2022.  IR ULTRASOUND, 06/12/2022. MEDICATIONS: The patient is currently admitted to the hospital and receiving intravenous antibiotics. The antibiotics were administered within an appropriate time frame prior to the initiation of the procedure. ANESTHESIA/SEDATION: Moderate (conscious) sedation was employed during this procedure. A total of Versed 0.5 mg and Fentanyl 37.5 mcg was administered intravenously. Moderate Sedation Time: 22 minutes. The patient's level of consciousness and vital signs were monitored continuously by radiology nursing throughout the procedure under my direct supervision. CONTRAST:  None COMPLICATIONS: None immediate. PROCEDURE: RADIATION DOSE REDUCTION: This exam was performed according to the departmental dose-optimization program which includes automated exposure control, adjustment of the mA and/or kV according to patient size and/or use of iterative reconstruction technique. Informed written consent was obtained  from the patient and/or patient's representative after a discussion of the risks, benefits and alternatives to treatment. The patient was placed supine on the CT gantry and a pre procedural CT was performed re-demonstrating the known fluid collection within the LEFT chest. The procedure was planned. A timeout was performed prior to the initiation of the procedure. The LEFT chest was prepped and draped in the usual sterile fashion. The overlying soft tissues were anesthetized with 1% lidocaine. Appropriate trajectory was planned with the use of a 22 gauge spinal needle. An 18 gauge trocar needle was advanced into the LEFT chest collection and a short Amplatz super stiff wire was coiled within the collection. Appropriate positioning was confirmed with a limited CT scan. The tract was serially dilated allowing placement of a 12 Fr drainage catheter. Appropriate positioning was confirmed with a limited postprocedural CT scan. 20 mL of thin pleural fluid was aspirated. The tube was connected to a pleura suction canister and sutured in place. A dressing was placed. The patient tolerated the procedure well without immediate post procedural complication. IMPRESSION: Successful CT guided placement of a LEFT 12 Fr non tunneled pleural drainage catheter for loculated pleural effusion. Samples were sent to the laboratory as requested by the ordering clinical team. Michaelle Birks, MD [facility] Radiology Electronically Signed   By: Michaelle Birks M.D.   On: 06/18/2022 15:19   CT HEAD WO CONTRAST (5MM)  Result Date: 06/15/2022 CLINICAL DATA:  Altered mental status EXAM: CT HEAD WITHOUT CONTRAST TECHNIQUE: Contiguous axial images were obtained from the base of the skull through the vertex without intravenous contrast. RADIATION DOSE REDUCTION: This exam was performed according to the departmental dose-optimization program which includes automated exposure control, adjustment of the  mA and/or kV according to patient size and/or use of iterative reconstruction technique. COMPARISON:  11/20/2021 FINDINGS: Brain: No mass, hemorrhage or extra-axial collection. There is bilateral basal ganglia mineralization. There is periventricular hypoattenuation compatible with chronic microvascular disease. Mild volume loss for age. Vascular: Calcific atherosclerosis of the ICAs at the skull base. Skull: Negative Sinuses/Orbits: No acute finding. Other: None. IMPRESSION: No acute intracranial abnormality. Chronic ischemic microangiopathy. Electronically Signed   By: Ulyses Jarred M.D.   On: 06/15/2022 01:49   CT CHEST WO CONTRAST  Result Date: 06/13/2022 CLINICAL DATA:  Pleural effusion, empyema EXAM: CT CHEST WITHOUT CONTRAST TECHNIQUE: Multidetector CT imaging of the chest was performed following the standard protocol without IV contrast. RADIATION DOSE REDUCTION: This exam was performed according to the departmental dose-optimization program which includes automated exposure control, adjustment of the  mA and/or kV according to patient size and/or use of iterative reconstruction technique. COMPARISON:  Previous studies including chest radiographs done on 06/12/2022 and CT done on 06/01/2022 FINDINGS: Cardiovascular: Heart is enlarged in size. Coronary artery calcifications are seen. Small pericardial effusion is present.There is a vascular stent in the right upper arm. Mediastinum/Nodes: Slightly enlarged lymph nodes in mediastinum appear stable. Lungs/Pleura: There is interval increase in left pleural effusion. There is moderate to large left pleural effusion. Part of the effusion appears to be loculated along the lateral aspect of left mid lung field and within interlobar fissure. Lobulations are seen in the effusion in the posterior aspect of left lower lung field. There is dense infiltrate in left upper lobe. There is interval decrease in area of left upper lobe infiltrate. There is interval decrease  in infiltrates in right middle lobe. There is interval decrease in amount of right pleural effusion. There are increased markings in both lower lobes, more so on the left side suggesting atelectasis/pneumonia. There is no pneumothorax. Upper Abdomen: Arterial calcifications are seen in aorta and its major branches. Musculoskeletal: No acute findings are seen. IMPRESSION: There is interval increase in amount of left pleural effusion. There is moderate to large left pleural effusion in the current study. Part of the effusion appears to be loculated within interlobar fissure and along the lateral and posterior aspect of left mid and left lower lung fields. There is no significant thickening of pleura. There are no pockets of air in the pleural effusion. However, possibility of empyema in the left pleural space is not excluded. There is no pneumothorax. There is interval decrease in area of infiltrate in left upper lobe with a dense residual infiltrate in the current study. There is interval decrease in infiltrates in right middle lobe. There is interval worsening of infiltrate in left lower lobe suggesting atelectasis/pneumonia. Cardiomegaly. Coronary artery disease. Small pericardial effusion. Aortic arteriosclerosis. Electronically Signed   By: Elmer Picker M.D.   On: 06/13/2022 19:43   ECHOCARDIOGRAM SCANNED RESULT  Result Date: 06/13/2022 Ordered by an unspecified provider.  US THORACENTESIS ASP PLEURAL SPACE W/IMG GUIDE  Result Date: 06/12/2022 INDICATION: Recurrent LEFT pleural effusion EXAM: ULTRASOUND GUIDED THERAPEUTIC LEFT THORACENTESIS MEDICATIONS: None. COMPLICATIONS: None immediate. PROCEDURE: An ultrasound guided thoracentesis was thoroughly discussed with the patient and questions answered. The benefits, risks, alternatives and complications were also discussed. The patient understands and wishes to proceed with the procedure. Written consent was obtained. Ultrasound was performed to  localize and mark an adequate pocket of fluid in the LEFT chest. The area was then prepped and draped in the normal sterile fashion. 1% Lidocaine was used for local anesthesia. Under ultrasound guidance a 6 Fr Safe-T-Centesis catheter was introduced. No fluid could be aspirated. Under direct sonographic guidance, a 5 [place] Yueh catheter was placed into the effusion. Only about 100 cc of serosanguineous to bloody fluid could be aspirated. Further ultrasound interrogation demonstrates that the fluid is complicated, containing diffuse internal echogenicity question clot, as well as internal septations, consistent with hemothorax new since previous thoracentesis. Procedure was then terminated. The catheter was removed and a dressing applied. FINDINGS: A only 100 mL of LEFT pleural fluid was removed. Observed recurrent LEFT pleural effusion is complicated containing septations and scattered low level internal echogenicity question clot. IMPRESSION: Ultrasound guided therapeutic LEFT thoracentesis yielding only 100 mL of pleural fluid as above. Electronically Signed   By: Lavonia Dana M.D.   On: 06/12/2022 11:48   DG Chest 1 View  Result Date: 06/12/2022 CLINICAL DATA:  Post LEFT thoracentesis EXAM: CHEST  1 VIEW COMPARISON:  Exam at 1115 hours compared to 06/12/2022 at 0851 hours FINDINGS: Enlargement of cardiac silhouette with stable loop recorder. Pulmonary vascular congestion. Elevation of LEFT diaphragm. Persistent LEFT pleural effusion and basilar atelectasis. No pneumothorax following thoracentesis. Osseous structures unremarkable. IMPRESSION: No pneumothorax following thoracentesis. Electronically Signed   By: Lavonia Dana M.D.   On: 06/12/2022 11:43   DG Chest 2 View  Result Date: 06/12/2022 CLINICAL DATA:  Dyspnea. EXAM: CHEST - 2 VIEW COMPARISON:  June 12, 2022 FINDINGS: Image rotated to LEFT. Accounting for this no change in cardiac enlargement and fullness of hilar structures. Slight enlargement  of moderately large LEFT-sided pleural effusion since previous imaging, associated with LEFT lower lobe consolidative changes. Pleural fluid in the LEFT chest is also likely partially loculated in the major fissure based on lateral projection. Mild increased interstitial markings. On limited assessment no acute skeletal process. Cardiac loop recorder projects over the LEFT chest. IMPRESSION: 1. Slight enlargement of moderately large LEFT-sided pleural effusion since previous imaging, associated with LEFT lower lobe consolidative changes. Pleural fluid not tracks into the major fissure in the LEFT chest. 2. LEFT upper lobe process better demonstrated on recent CT and as compared to recent chest radiograph displays little change. 3. Constellation of findings while potentially related to pneumonia should be followed to ensure clearing and exclude the possibility of underlying neoplasm. Electronically Signed   By: Zetta Bills M.D.   On: 06/12/2022 09:11   US THORACENTESIS ASP PLEURAL SPACE W/IMG GUIDE  Result Date: 06/10/2022 INDICATION: LEFT pleural effusion EXAM: ULTRASOUND GUIDED DIAGNOSTIC AND THERAPEUTIC LEFT THORACENTESIS MEDICATIONS: None. COMPLICATIONS: None immediate. PROCEDURE: An ultrasound guided thoracentesis was thoroughly discussed with the patient and questions answered. The benefits, risks, alternatives and complications were also discussed. The patient understands and wishes to proceed with the procedure. Written consent was obtained. Ultrasound was performed to localize and mark an adequate pocket of fluid in the LEFT chest. The area was then prepped and draped in the normal sterile fashion. 1% Lidocaine was used for local anesthesia. Under ultrasound guidance a 6 Fr Safe-T-Centesis catheter was introduced. Thoracentesis was performed. The catheter was removed and a dressing applied. FINDINGS: A total of approximately 1.2 L of serosanguineous fluid was removed. Samples were sent to the  laboratory as requested by the clinical team. IMPRESSION: Successful ultrasound guided LEFT thoracentesis yielding 1.2 L of pleural fluid. Electronically Signed   By: Lavonia Dana M.D.   On: 06/10/2022 12:55   DG Chest 1 View  Result Date: 06/10/2022 CLINICAL DATA:  LEFT pleural effusion post thoracentesis EXAM: CHEST  1 VIEW COMPARISON:  06/09/2022 FINDINGS: Stable heart size and mediastinal contours. Increased LEFT upper lobe opacity favor infiltrate. Decreased atelectasis and effusion at LEFT base. No pneumothorax following thoracentesis. RIGHT lung clear. Bones demineralized. IMPRESSION: Decrease in LEFT pleural effusion and basilar atelectasis post thoracentesis. No pneumothorax. Increased LEFT upper lobe opacity question infiltrate/pneumonia. Electronically Signed   By: Lavonia Dana M.D.   On: 06/10/2022 12:53   DG Chest 2 View  Result Date: 06/09/2022 CLINICAL DATA:  Shortness of breath EXAM: CHEST - 2 VIEW COMPARISON:  Chest x-ray dated June 01, 2022 FINDINGS: Visualized cardiac and mediastinal contours are unchanged. Moderate to large left pleural effusion, increased in size when compared with prior exam. Previously described left upper lobe opacity is not well seen on today's exam, likely obscured by large effusion. Increased mild diffuse interstitial opacities.  No evidence of pneumothorax. IMPRESSION: 1. Moderate to large left pleural effusion, increased in size when compared with prior exam. 2. Previously described left upper lobe opacity is not well seen on today's exam, likely obscured by large effusion. 3. Increased mild diffuse interstitial opacities, concerning for pulmonary edema Electronically Signed   By: Yetta Glassman M.D.   On: 06/09/2022 20:11   CT Chest W Contrast  Result Date: 06/01/2022 CLINICAL DATA:  Chest pain, abnormal chest radiograph EXAM: CT CHEST WITH CONTRAST TECHNIQUE: Multidetector CT imaging of the chest was performed during intravenous contrast  administration. RADIATION DOSE REDUCTION: This exam was performed according to the departmental dose-optimization program which includes automated exposure control, adjustment of the mA and/or kV according to patient size and/or use of iterative reconstruction technique. CONTRAST:  90m OMNIPAQUE IOHEXOL 300 MG/ML  SOLN COMPARISON:  Chest/left rib radiographs dated 06/01/2022 FINDINGS: Cardiovascular: The heart is normal in size. No pericardial effusion. No evidence of thoracic aortic aneurysm. Atherosclerotic calcifications of the aortic arch. Three vessel coronary atherosclerosis. Mediastinum/Nodes: No suspicious mediastinal lymphadenopathy. Visualized thyroid is unremarkable. Lungs/Pleura: Focal patchy/sub solid opacity in the central left upper lobe, corresponding to the radiographic abnormality. Additional mild patchy opacity in the posterior right middle lobe (series 5/image 35). This appearance is compatible with multifocal pneumonia. No frank interstitial edema. Moderate left and small right pleural effusions. Associated bibasilar opacities, likely atelectasis. No pneumothorax. Upper Abdomen: Visualized upper abdomen is grossly unremarkable, noting vascular calcifications. Musculoskeletal: Mild degenerative changes of the visualized thoracolumbar spine. No rib fracture is seen. IMPRESSION: Multifocal pneumonia in the left upper lobe and right middle lobe, accounting for the radiographic abnormality. Follow-up chest radiographs are suggested in 4-6 weeks to document clearance. Moderate left and small right pleural effusions. No frank interstitial edema. No rib fracture is seen. Aortic Atherosclerosis (ICD10-I70.0). Electronically Signed   By: SJulian HyM.D.   On: 06/01/2022 23:50   DG Ribs Unilateral W/Chest Left  Result Date: 06/01/2022 CLINICAL DATA:  Shortness of breath, cough EXAM: LEFT RIBS AND CHEST - 3+ VIEW COMPARISON:  Chest radiograph dated 05/09/2022 FINDINGS: Rounded/masslike left  upper lobe opacity, new from recent prior chest radiograph, suspicious for pneumonia. Left basilar opacity, atelectasis versus pneumonia. No pleural effusion or pneumothorax. The heart is top-normal in size. No displaced left rib fracture is seen. IMPRESSION: Rounded/masslike left upper lobe opacity, new from recent chest radiograph, suspicious for pneumonia. Left basilar opacity, atelectasis versus pneumonia. At a minimum, follow-up chest radiographs are suggested in 3-4 weeks to document clearance. If the patient does not have signs/symptoms of infection, consider CT chest with contrast for further evaluation. No displaced left rib fracture is seen. Electronically Signed   By: SJulian HyM.D.   On: 06/01/2022 19:03    Labs: BNP (last 3 results) Recent Labs    05/09/22 0659 06/09/22 2000  BNP 2,370.0* 10,109.3   Basic Metabolic Panel: Recent Labs  Lab 06/25/22 0851 06/26/22 0636 06/27/22 0754 06/30/22 0710  NA 134* 135 135 133*  K 4.9 4.8 4.9 4.5  CL 93* 97* 97* 93*  CO2 '28 27 27 28  '$ GLUCOSE 123* 114* 206* 277*  BUN 29* '11 20 21  '$ CREATININE 7.63* 4.59* 6.52* 7.71*  CALCIUM 7.4* 7.4* 7.2* 7.9*  PHOS 3.2 3.3 3.3 2.3*   Liver Function Tests: Recent Labs  Lab 06/25/22 0851 06/26/22 0636 06/27/22 0754 06/30/22 0710  ALBUMIN 2.2* 2.3* 2.1* 2.3*   No results for input(s): "LIPASE", "AMYLASE" in the last 168 hours. No results  for input(s): "AMMONIA" in the last 168 hours. CBC: Recent Labs  Lab 06/25/22 0851 06/26/22 0636 06/27/22 0754 06/30/22 0710  WBC 9.5 12.3* 11.4* 9.3  HGB 7.7* 8.3* 8.0* 8.7*  HCT 23.0* 25.1* 24.0* 26.5*  MCV 94.7 95.4 93.8 94.3  PLT 187 182 167 180   Cardiac Enzymes: No results for input(s): "CKTOTAL", "CKMB", "CKMBINDEX", "TROPONINI" in the last 168 hours. BNP: Invalid input(s): "POCBNP" CBG: Recent Labs  Lab 06/30/22 0708 06/30/22 1232 06/30/22 1643 06/30/22 2010 07/01/22 0717  GLUCAP 286* 112* 259* 283* 157*   D-Dimer No  results for input(s): "DDIMER" in the last 72 hours. Hgb A1c No results for input(s): "HGBA1C" in the last 72 hours. Lipid Profile No results for input(s): "CHOL", "HDL", "LDLCALC", "TRIG", "CHOLHDL", "LDLDIRECT" in the last 72 hours. Thyroid function studies No results for input(s): "TSH", "T4TOTAL", "T3FREE", "THYROIDAB" in the last 72 hours.  Invalid input(s): "FREET3" Anemia work up No results for input(s): "VITAMINB12", "FOLATE", "FERRITIN", "TIBC", "IRON", "RETICCTPCT" in the last 72 hours. Urinalysis    Component Value Date/Time   COLORURINE YELLOW 10/15/2017 2244   APPEARANCEUR HAZY (A) 10/15/2017 2244   LABSPEC 1.015 10/15/2017 2244   PHURINE 7.0 10/15/2017 2244   GLUCOSEU >=500 (A) 10/15/2017 2244   HGBUR SMALL (A) 10/15/2017 2244   BILIRUBINUR NEGATIVE 10/15/2017 2244   KETONESUR NEGATIVE 10/15/2017 2244   PROTEINUR 100 (A) 10/15/2017 2244   UROBILINOGEN 0.2 11/19/2014 0909   NITRITE NEGATIVE 10/15/2017 2244   LEUKOCYTESUR MODERATE (A) 10/15/2017 2244   Sepsis Labs Recent Labs  Lab 06/25/22 0851 06/26/22 0636 06/27/22 0754 06/30/22 0710  WBC 9.5 12.3* 11.4* 9.3   Microbiology No results found for this or any previous visit (from the past 240 hour(s)).   Time coordinating discharge: 25 minutes  SIGNED: Antonieta Pert, MD  [facility] 07/01/2022, 10:39 AM  If 7PM-7AM, please contact night-coverage [email or web address]

## 2022-07-01 NOTE — TOC Transition Note (Signed)
Transition of Care [facility]) - CM/SW Discharge Note   Patient Details  Name: [name] MRN: [id number] Date of Birth: 11/28/1940  Transition of Care [facility]) CM/SW Contact:  Milinda Antis, [place] Phone Number: 07/01/2022, 12:39 PM   Clinical Narrative:    Patient will DC to: [facility] Anticipated DC date: 07/01/2021 Family notified:  Yes, daughter Brewing technologist by: Corey Harold   Per MD patient ready for DC to SNF. RN to call report prior to discharge ([phone] room B13 bed 2). RN, patient, patient's family, and facility notified of DC. Discharge Summary  sent to facility. DC packet on chart. Ambulance transport will be requested for patient.   CSW will sign off for now as social work intervention is no longer needed. Please consult [place] again if new needs arise.    Final next level of care: Skilled Nursing Facility Barriers to Discharge: Barriers Resolved   Patient Goals and CMS Choice CMS Medicare.gov Compare Post Acute Care list provided to:: Patient Represenative (must comment) Choice offered to / list presented to : Adult Children  Discharge Placement                Patient chooses bed at:  [facility]) Patient to be transferred to facility by: [place] Name of family member notified: Paislee, Szatkowski (Daughter) [phone] Patient and family notified of of transfer: 07/01/22  Discharge Plan and Services Additional resources added to the After Visit Summary for   In-house Referral: Clinical Social Work                        [facility] Arranged: PT   Date [place]: 06/12/22      Social Determinants of Health ([place]) Interventions [place]: No Food Insecurity (06/13/2022)  Housing: Low Risk  (06/13/2022)  Transportation Needs: No Transportation Needs (06/13/2022)  Utilities: Not At Risk (06/13/2022)  Tobacco Use: Low Risk  (06/10/2022)     Readmission Risk Interventions    06/12/2022    3:57 PM  06/10/2022   11:01 AM 01/17/2020    9:17 AM  Readmission Risk Prevention Plan  Transportation Screening Complete Complete Complete  PCP or Specialist Appt within 5-7 Days   Not Complete  Not Complete comments   pending disposition  Home Care Screening   Complete  Medication Review (RN CM)   Referral to Pharmacy  [place] or [place] Complete Complete   Social Work Consult for [place] Planning/Counseling Complete Complete   Palliative Care Screening Not Applicable Not Applicable   Medication Review Press photographer) Complete Complete

## 2022-07-02 DIAGNOSIS — Z992 Dependence on renal dialysis: Secondary | ICD-10-CM | POA: Diagnosis not present

## 2022-07-02 DIAGNOSIS — N186 End stage renal disease: Secondary | ICD-10-CM | POA: Diagnosis not present

## 2022-07-03 DIAGNOSIS — N186 End stage renal disease: Secondary | ICD-10-CM | POA: Diagnosis not present

## 2022-07-03 DIAGNOSIS — E782 Mixed hyperlipidemia: Secondary | ICD-10-CM | POA: Diagnosis not present

## 2022-07-03 DIAGNOSIS — J90 Pleural effusion, not elsewhere classified: Secondary | ICD-10-CM | POA: Diagnosis not present

## 2022-07-03 DIAGNOSIS — E1142 Type 2 diabetes mellitus with diabetic polyneuropathy: Secondary | ICD-10-CM | POA: Diagnosis not present

## 2022-07-03 DIAGNOSIS — M6281 Muscle weakness (generalized): Secondary | ICD-10-CM | POA: Diagnosis not present

## 2022-07-04 ENCOUNTER — Encounter (HOSPITAL_COMMUNITY): Payer: Self-pay | Admitting: Emergency Medicine

## 2022-07-04 ENCOUNTER — Emergency Department (HOSPITAL_COMMUNITY): Payer: 59

## 2022-07-04 ENCOUNTER — Emergency Department (HOSPITAL_COMMUNITY)
Admission: EM | Admit: 2022-07-04 | Discharge: 2022-07-04 | Disposition: A | Discharge status: Home / Self Care | Payer: 59 | Attending: Emergency Medicine | Admitting: Emergency Medicine

## 2022-07-04 ENCOUNTER — Other Ambulatory Visit: Payer: Self-pay

## 2022-07-04 DIAGNOSIS — N186 End stage renal disease: Secondary | ICD-10-CM | POA: Diagnosis not present

## 2022-07-04 DIAGNOSIS — R0789 Other chest pain: Secondary | ICD-10-CM | POA: Diagnosis not present

## 2022-07-04 DIAGNOSIS — E119 Type 2 diabetes mellitus without complications: Secondary | ICD-10-CM | POA: Diagnosis not present

## 2022-07-04 DIAGNOSIS — Z992 Dependence on renal dialysis: Secondary | ICD-10-CM | POA: Diagnosis not present

## 2022-07-04 DIAGNOSIS — Z7982 Long term (current) use of aspirin: Secondary | ICD-10-CM | POA: Diagnosis not present

## 2022-07-04 DIAGNOSIS — E875 Hyperkalemia: Secondary | ICD-10-CM | POA: Insufficient documentation

## 2022-07-04 DIAGNOSIS — J90 Pleural effusion, not elsewhere classified: Secondary | ICD-10-CM | POA: Diagnosis not present

## 2022-07-04 DIAGNOSIS — R079 Chest pain, unspecified: Secondary | ICD-10-CM | POA: Diagnosis not present

## 2022-07-04 LAB — CBC
HCT: 32.6 % — ABNORMAL LOW (ref 36.0–46.0)
Hemoglobin: 10.2 g/dL — ABNORMAL LOW (ref 12.0–15.0)
MCH: 30.6 pg (ref 26.0–34.0)
MCHC: 31.3 g/dL (ref 30.0–36.0)
MCV: 97.9 fL (ref 80.0–100.0)
Platelets: 172 10*3/uL (ref 150–400)
RBC: 3.33 MIL/uL — ABNORMAL LOW (ref 3.87–5.11)
RDW: 16.1 % — ABNORMAL HIGH (ref 11.5–15.5)
WBC: 14.7 10*3/uL — ABNORMAL HIGH (ref 4.0–10.5)
nRBC: 0 % (ref 0.0–0.2)

## 2022-07-04 LAB — BASIC METABOLIC PANEL
Anion gap: 11 (ref 5–15)
BUN: 27 mg/dL — ABNORMAL HIGH (ref 8–23)
CO2: 28 mmol/L (ref 22–32)
Calcium: 8.1 mg/dL — ABNORMAL LOW (ref 8.9–10.3)
Chloride: 96 mmol/L — ABNORMAL LOW (ref 98–111)
Creatinine, Ser: 7.06 mg/dL — ABNORMAL HIGH (ref 0.44–1.00)
GFR, Estimated: 5 mL/min — ABNORMAL LOW (ref >60)
Glucose, Bld: 165 mg/dL — ABNORMAL HIGH (ref 70–99)
Potassium: 6.4 mmol/L (ref 3.5–5.1)
Sodium: 135 mmol/L (ref 135–145)

## 2022-07-04 LAB — TROPONIN I (HIGH SENSITIVITY)
Troponin I (High Sensitivity): 22 ng/L — ABNORMAL HIGH (ref <18)
Troponin I (High Sensitivity): 20 ng/L — ABNORMAL HIGH (ref <18)

## 2022-07-04 MED ORDER — SODIUM ZIRCONIUM CYCLOSILICATE 5 G PO PACK
10.0000 g | PACK | Freq: Once | ORAL | Status: AC
  Administered 2022-07-04: 10 g via ORAL
  Filled 2022-07-04: qty 2

## 2022-07-04 NOTE — Discharge Instructions (Signed)
Go to your dialysis directly from here.  Follow-up with your family doctor if any problems

## 2022-07-04 NOTE — ED Triage Notes (Signed)
Pt BIB [facility] from dialysis. Pt complained of left sided chest pain at dialysis this morning, pt stated she has been having chest pain on and off since last Friday. EMS noted pt to be hypertensive. Pt has not had any of her daily meds and did not get her dialysis completed.   Pt denies any chest pain at this time.

## 2022-07-05 DIAGNOSIS — J90 Pleural effusion, not elsewhere classified: Secondary | ICD-10-CM | POA: Diagnosis not present

## 2022-07-05 DIAGNOSIS — M6281 Muscle weakness (generalized): Secondary | ICD-10-CM | POA: Diagnosis not present

## 2022-07-05 DIAGNOSIS — N186 End stage renal disease: Secondary | ICD-10-CM | POA: Diagnosis not present

## 2022-07-05 DIAGNOSIS — E1142 Type 2 diabetes mellitus with diabetic polyneuropathy: Secondary | ICD-10-CM | POA: Diagnosis not present

## 2022-07-05 DIAGNOSIS — E119 Type 2 diabetes mellitus without complications: Secondary | ICD-10-CM | POA: Diagnosis not present

## 2022-07-05 DIAGNOSIS — E1165 Type 2 diabetes mellitus with hyperglycemia: Secondary | ICD-10-CM | POA: Diagnosis not present

## 2022-07-07 DIAGNOSIS — Z992 Dependence on renal dialysis: Secondary | ICD-10-CM | POA: Diagnosis not present

## 2022-07-07 DIAGNOSIS — E1165 Type 2 diabetes mellitus with hyperglycemia: Secondary | ICD-10-CM | POA: Diagnosis not present

## 2022-07-07 DIAGNOSIS — N186 End stage renal disease: Secondary | ICD-10-CM | POA: Diagnosis not present

## 2022-07-08 NOTE — ED Provider Notes (Signed)
[facility] Provider Note   CSN: [id number] Arrival date & time: 07/04/22  7169     History  Chief Complaint  Patient presents with   Chest Pain    [name] is a 82 y.o. female.  She was at dialysis and was having some chest pain so she was sent to the emergency department for evaluation.  Patient not having any chest pain now chest pain  The history is provided by the patient and medical records. No language interpreter was used.  Chest Pain Pain location:  L chest Pain quality: aching   Pain radiates to:  Does not radiate Pain severity:  Mild Onset quality:  Gradual Timing:  Intermittent Progression:  Resolved Chronicity:  New Context: not breathing   Relieved by:  Nothing Associated symptoms: no abdominal pain, no back pain, no cough, no fatigue and no headache        Home Medications Prior to Admission medications   Medication Sig Start Date End Date Taking? Authorizing Provider  albuterol (VENTOLIN HFA) 108 (90 Base) MCG/ACT inhaler Inhale 2 puffs into the lungs every 6 (six) hours as needed for wheezing or shortness of breath. 11/21/20  Yes Gerlene Fee, NP  aspirin 325 MG tablet Take 650 mg by mouth daily. 07/09/18  Yes Rehman, Mechele Dawley, MD  benzonatate (TESSALON) 100 MG capsule Take 1 capsule (100 mg total) by mouth 3 (three) times daily as needed for cough. 06/03/22  Yes Shah, Pratik D, DO  cyanocobalamin (,VITAMIN B-12,) 1000 MCG/ML injection Inject 1,000 mcg into the muscle every 30 (thirty) days. 11/01/14  Yes [provider]  diclofenac Sodium (VOLTAREN) 1 % GEL Apply 4 g topically 4 (four) times daily as needed. Apply to bilateral knees 03/16/21  Yes Scot Jun, NP  insulin aspart protamine- aspart (NOVOLOG MIX 70/30) (70-30) 100 UNIT/ML injection Inject 0.03 mLs (3 Units total) into the skin 2 (two) times daily with a meal. 07/01/22  Yes Kc, Maren [place], MD  multivitamin (RENA-VIT) TABS tablet  Take 1 tablet by mouth at bedtime. 08/21/14  Yes Kirsteins, Luanna Salk, MD  omeprazole (PRILOSEC) 40 MG capsule TAKE ONE CAPSULE BY MOUTH TWICE DAILY BEFORE A meal Patient taking differently: Take 40 mg by mouth in the morning and at bedtime. 04/16/22  Yes Mansouraty, Telford Nab., MD  QUEtiapine (SEROQUEL) 25 MG tablet Take 0.5 tablets (12.5 mg total) by mouth at bedtime. 07/01/22  Yes Antonieta Pert, MD  sevelamer carbonate (RENVELA) 800 MG tablet Take 1 tablet (800 mg total) by mouth 2 (two) times daily before lunch and supper. 11/21/20  Yes Gerlene Fee, NP  BD INSULIN SYRINGE U/F 31G X 5/16" 0.3 ML MISC Inject 1 each into the skin 2 (two) times daily. as directed 11/21/20   Gerlene Fee, NP      Allergies    Ambien [zolpidem] and Reglan [metoclopramide]    Review of Systems   Review of Systems  Constitutional:  Negative for appetite change and fatigue.  HENT:  Negative for congestion, ear discharge and sinus pressure.   Eyes:  Negative for discharge.  Respiratory:  Negative for cough.   Cardiovascular:  Positive for chest pain.  Gastrointestinal:  Negative for abdominal pain and diarrhea.  Genitourinary:  Negative for frequency and hematuria.  Musculoskeletal:  Negative for back pain.  Skin:  Negative for rash.  Neurological:  Negative for seizures and headaches.  Psychiatric/Behavioral:  Negative for hallucinations.     Physical  Exam Updated Vital Signs BP (!) 186/80 (BP Location: Left Arm)   Pulse 99   Temp 98.2 F (36.8 C) (Oral)   Resp 18   Ht '5\' 7"'$  (1.702 m)   Wt 64.6 kg   SpO2 99%   BMI 22.31 kg/m  Physical Exam Vitals and nursing note reviewed.  Constitutional:      Appearance: She is well-developed.  HENT:     Head: Normocephalic.     Nose: Nose normal.  Eyes:     General: No scleral icterus.    Conjunctiva/sclera: Conjunctivae normal.  Neck:     Thyroid: No thyromegaly.  Cardiovascular:     Rate and Rhythm: Normal rate and regular rhythm.     Heart sounds:  No murmur heard.    No friction rub. No gallop.  Pulmonary:     Breath sounds: No stridor. No wheezing or rales.  Chest:     Chest wall: No tenderness.  Abdominal:     General: There is no distension.     Tenderness: There is no abdominal tenderness. There is no rebound.  Musculoskeletal:        General: Normal range of motion.     Cervical back: Neck supple.  Lymphadenopathy:     Cervical: No cervical adenopathy.  Skin:    Findings: No erythema or rash.  Neurological:     Mental Status: She is alert and oriented to person, place, and time.     Motor: No abnormal muscle tone.     Coordination: Coordination normal.  Psychiatric:        Behavior: Behavior normal.     ED Results / Procedures / Treatments   Labs (all labs ordered are listed, but only abnormal results are displayed) Labs Reviewed  BASIC METABOLIC PANEL - Abnormal; Notable for the following components:      Result Value   Potassium 6.4 (*)    Chloride 96 (*)    Glucose, Bld 165 (*)    BUN 27 (*)    Creatinine, Ser 7.06 (*)    Calcium 8.1 (*)    GFR, Estimated 5 (*)    All other components within normal limits  CBC - Abnormal; Notable for the following components:   WBC 14.7 (*)    RBC 3.33 (*)    Hemoglobin 10.2 (*)    HCT 32.6 (*)    RDW 16.1 (*)    All other components within normal limits  TROPONIN I (HIGH SENSITIVITY) - Abnormal; Notable for the following components:   Troponin I (High Sensitivity) 22 (*)    All other components within normal limits  TROPONIN I (HIGH SENSITIVITY) - Abnormal; Notable for the following components:   Troponin I (High Sensitivity) 20 (*)    All other components within normal limits    EKG EKG Interpretation  Date/Time:  Friday July 04 2022 06:28:15 EST Ventricular Rate:  96 PR Interval:  144 QRS Duration: 81 QT Interval:  360 QTC Calculation: 455 R Axis:   18 Text Interpretation: Sinus rhythm Low voltage, precordial leads No significant change since  06/09/2022 Confirmed by Veryl Speak [phone]) on 07/04/2022 6:29:45 AM  Radiology No results found.  Procedures Procedures    Medications Ordered in ED Medications  sodium zirconium cyclosilicate (LOKELMA) packet 10 g (10 g Oral Given 07/04/22 0801)    ED Course/ Medical Decision Making/ A&P  I spoke with nephrology and they felt like given the patient like, are not sending her back to [facility] to  finish her treatment was appropriate                           Medical Decision Making Amount and/or Complexity of Data Reviewed Labs: ordered. Radiology: ordered.  Risk Prescription drug management.  This patient presents to the ED for concern of chest pain, this involves an extensive number of treatment options, and is a complaint that carries with it a high risk of complications and morbidity.  The differential diagnosis includes atypical chest pain, MI   Co morbidities that complicate the patient evaluation  Dialysis patient   Additional history obtained:  Additional history obtained from patient External records from outside source obtained and reviewed including hospital records   Lab Tests:  I Ordered, and personally interpreted labs.  The pertinent results include: Potassium 6.4, hemoglobin 10, troponin 20 and 22   Imaging Studies ordered:  I ordered imaging studies including x-ray I independently visualized and interpreted imaging which showed mild effusion I agree with the radiologist interpretation   Cardiac Monitoring: / EKG:  The patient was maintained on a cardiac monitor.  I personally viewed and interpreted the cardiac monitored which showed an underlying rhythm of: Normal sinus rhythm   Consultations Obtained:  I requested consultation with the nephrologist,  and discussed lab and imaging findings as well as pertinent plan - they recommend: Give blood, and send the patient back to dialysis   Problem List / ED Course / Critical interventions  / Medication management  Kidney failure and chest pain I ordered medication including Lokelma for hyperkalemia Reevaluation of the patient after these medicines showed that the patient improved I have reviewed the patients home medicines and have made adjustments as needed   Social Determinants of Health:  None none   Test / Admission - Considered:  None  Patient sent from dialysis with chest discomfort.  She no longer has pain in her troponins have been stable and low.  EKG is unremarkable.  Patient will be sent back to the dialysis center and she is given some Lokelma for        Final Clinical Impression(s) / ED Diagnoses Final diagnoses:  Atypical chest pain    Rx / DC Orders ED Discharge Orders     None         Milton Ferguson, MD 07/08/22 1222

## 2022-07-09 DIAGNOSIS — Z992 Dependence on renal dialysis: Secondary | ICD-10-CM | POA: Diagnosis not present

## 2022-07-09 DIAGNOSIS — M19011 Primary osteoarthritis, right shoulder: Secondary | ICD-10-CM | POA: Diagnosis not present

## 2022-07-09 DIAGNOSIS — N186 End stage renal disease: Secondary | ICD-10-CM | POA: Diagnosis not present

## 2022-07-10 ENCOUNTER — Ambulatory Visit: Payer: Medicare Other | Admitting: Orthopedic Surgery

## 2022-07-10 DIAGNOSIS — E119 Type 2 diabetes mellitus without complications: Secondary | ICD-10-CM | POA: Diagnosis not present

## 2022-07-10 DIAGNOSIS — J90 Pleural effusion, not elsewhere classified: Secondary | ICD-10-CM | POA: Diagnosis not present

## 2022-07-10 DIAGNOSIS — E1142 Type 2 diabetes mellitus with diabetic polyneuropathy: Secondary | ICD-10-CM | POA: Diagnosis not present

## 2022-07-10 DIAGNOSIS — J942 Hemothorax: Secondary | ICD-10-CM | POA: Diagnosis not present

## 2022-07-10 DIAGNOSIS — Z794 Long term (current) use of insulin: Secondary | ICD-10-CM | POA: Diagnosis not present

## 2022-07-10 DIAGNOSIS — D649 Anemia, unspecified: Secondary | ICD-10-CM | POA: Diagnosis not present

## 2022-07-10 DIAGNOSIS — N186 End stage renal disease: Secondary | ICD-10-CM | POA: Diagnosis not present

## 2022-07-10 DIAGNOSIS — K219 Gastro-esophageal reflux disease without esophagitis: Secondary | ICD-10-CM | POA: Diagnosis not present

## 2022-07-11 DIAGNOSIS — Z992 Dependence on renal dialysis: Secondary | ICD-10-CM | POA: Diagnosis not present

## 2022-07-11 DIAGNOSIS — N186 End stage renal disease: Secondary | ICD-10-CM | POA: Diagnosis not present

## 2022-07-14 DIAGNOSIS — Z794 Long term (current) use of insulin: Secondary | ICD-10-CM | POA: Diagnosis not present

## 2022-07-14 DIAGNOSIS — Z992 Dependence on renal dialysis: Secondary | ICD-10-CM | POA: Diagnosis not present

## 2022-07-14 DIAGNOSIS — E1142 Type 2 diabetes mellitus with diabetic polyneuropathy: Secondary | ICD-10-CM | POA: Diagnosis not present

## 2022-07-14 DIAGNOSIS — N186 End stage renal disease: Secondary | ICD-10-CM | POA: Diagnosis not present

## 2022-07-14 DIAGNOSIS — E1165 Type 2 diabetes mellitus with hyperglycemia: Secondary | ICD-10-CM | POA: Diagnosis not present

## 2022-07-14 DIAGNOSIS — E782 Mixed hyperlipidemia: Secondary | ICD-10-CM | POA: Diagnosis not present

## 2022-07-14 DIAGNOSIS — M6281 Muscle weakness (generalized): Secondary | ICD-10-CM | POA: Diagnosis not present

## 2022-07-14 DIAGNOSIS — K219 Gastro-esophageal reflux disease without esophagitis: Secondary | ICD-10-CM | POA: Diagnosis not present

## 2022-07-14 DIAGNOSIS — M25519 Pain in unspecified shoulder: Secondary | ICD-10-CM | POA: Diagnosis not present

## 2022-07-14 DIAGNOSIS — J90 Pleural effusion, not elsewhere classified: Secondary | ICD-10-CM | POA: Diagnosis not present

## 2022-07-15 DIAGNOSIS — N186 End stage renal disease: Secondary | ICD-10-CM | POA: Diagnosis not present

## 2022-07-15 DIAGNOSIS — I120 Hypertensive chronic kidney disease with stage 5 chronic kidney disease or end stage renal disease: Secondary | ICD-10-CM | POA: Diagnosis not present

## 2022-07-15 DIAGNOSIS — E1122 Type 2 diabetes mellitus with diabetic chronic kidney disease: Secondary | ICD-10-CM | POA: Diagnosis not present

## 2022-07-16 DIAGNOSIS — Z992 Dependence on renal dialysis: Secondary | ICD-10-CM | POA: Diagnosis not present

## 2022-07-16 DIAGNOSIS — N186 End stage renal disease: Secondary | ICD-10-CM | POA: Diagnosis not present

## 2022-07-17 ENCOUNTER — Ambulatory Visit: Payer: Medicare Other | Admitting: Orthopedic Surgery

## 2022-07-18 DIAGNOSIS — Z992 Dependence on renal dialysis: Secondary | ICD-10-CM | POA: Diagnosis not present

## 2022-07-18 DIAGNOSIS — N186 End stage renal disease: Secondary | ICD-10-CM | POA: Diagnosis not present

## 2022-07-21 DIAGNOSIS — E1122 Type 2 diabetes mellitus with diabetic chronic kidney disease: Secondary | ICD-10-CM | POA: Diagnosis not present

## 2022-07-21 DIAGNOSIS — Z794 Long term (current) use of insulin: Secondary | ICD-10-CM | POA: Diagnosis not present

## 2022-07-21 DIAGNOSIS — N186 End stage renal disease: Secondary | ICD-10-CM | POA: Diagnosis not present

## 2022-07-21 DIAGNOSIS — Z992 Dependence on renal dialysis: Secondary | ICD-10-CM | POA: Diagnosis not present

## 2022-07-22 DIAGNOSIS — M6281 Muscle weakness (generalized): Secondary | ICD-10-CM | POA: Diagnosis not present

## 2022-07-22 DIAGNOSIS — I120 Hypertensive chronic kidney disease with stage 5 chronic kidney disease or end stage renal disease: Secondary | ICD-10-CM | POA: Diagnosis not present

## 2022-07-22 DIAGNOSIS — Z7982 Long term (current) use of aspirin: Secondary | ICD-10-CM | POA: Diagnosis not present

## 2022-07-22 DIAGNOSIS — Z992 Dependence on renal dialysis: Secondary | ICD-10-CM | POA: Diagnosis not present

## 2022-07-22 DIAGNOSIS — Z9181 History of falling: Secondary | ICD-10-CM | POA: Diagnosis not present

## 2022-07-22 DIAGNOSIS — K219 Gastro-esophageal reflux disease without esophagitis: Secondary | ICD-10-CM | POA: Diagnosis not present

## 2022-07-22 DIAGNOSIS — M549 Dorsalgia, unspecified: Secondary | ICD-10-CM | POA: Diagnosis not present

## 2022-07-22 DIAGNOSIS — J90 Pleural effusion, not elsewhere classified: Secondary | ICD-10-CM | POA: Diagnosis not present

## 2022-07-22 DIAGNOSIS — D696 Thrombocytopenia, unspecified: Secondary | ICD-10-CM | POA: Diagnosis not present

## 2022-07-22 DIAGNOSIS — N186 End stage renal disease: Secondary | ICD-10-CM | POA: Diagnosis not present

## 2022-07-22 DIAGNOSIS — M25561 Pain in right knee: Secondary | ICD-10-CM | POA: Diagnosis not present

## 2022-07-22 DIAGNOSIS — M25562 Pain in left knee: Secondary | ICD-10-CM | POA: Diagnosis not present

## 2022-07-22 DIAGNOSIS — E1122 Type 2 diabetes mellitus with diabetic chronic kidney disease: Secondary | ICD-10-CM | POA: Diagnosis not present

## 2022-07-23 DIAGNOSIS — N186 End stage renal disease: Secondary | ICD-10-CM | POA: Diagnosis not present

## 2022-07-23 DIAGNOSIS — Z992 Dependence on renal dialysis: Secondary | ICD-10-CM | POA: Diagnosis not present

## 2022-07-25 DIAGNOSIS — Z992 Dependence on renal dialysis: Secondary | ICD-10-CM | POA: Diagnosis not present

## 2022-07-25 DIAGNOSIS — S92155A Nondisplaced avulsion fracture (chip fracture) of left talus, initial encounter for closed fracture: Secondary | ICD-10-CM | POA: Diagnosis not present

## 2022-07-25 DIAGNOSIS — S93402A Sprain of unspecified ligament of left ankle, initial encounter: Secondary | ICD-10-CM | POA: Diagnosis not present

## 2022-07-25 DIAGNOSIS — N186 End stage renal disease: Secondary | ICD-10-CM | POA: Diagnosis not present

## 2022-07-28 ENCOUNTER — Telehealth: Payer: Self-pay | Admitting: "Endocrinology

## 2022-07-28 DIAGNOSIS — D510 Vitamin B12 deficiency anemia due to intrinsic factor deficiency: Secondary | ICD-10-CM | POA: Diagnosis not present

## 2022-07-28 DIAGNOSIS — Z992 Dependence on renal dialysis: Secondary | ICD-10-CM | POA: Diagnosis not present

## 2022-07-28 DIAGNOSIS — E785 Hyperlipidemia, unspecified: Secondary | ICD-10-CM | POA: Diagnosis not present

## 2022-07-28 DIAGNOSIS — E1122 Type 2 diabetes mellitus with diabetic chronic kidney disease: Secondary | ICD-10-CM | POA: Diagnosis not present

## 2022-07-28 DIAGNOSIS — E1169 Type 2 diabetes mellitus with other specified complication: Secondary | ICD-10-CM

## 2022-07-28 DIAGNOSIS — I129 Hypertensive chronic kidney disease with stage 1 through stage 4 chronic kidney disease, or unspecified chronic kidney disease: Secondary | ICD-10-CM | POA: Diagnosis not present

## 2022-07-28 DIAGNOSIS — N186 End stage renal disease: Secondary | ICD-10-CM | POA: Diagnosis not present

## 2022-07-28 NOTE — Telephone Encounter (Signed)
It looks like she needs updated labs

## 2022-07-28 NOTE — Telephone Encounter (Signed)
Labs updated and sent to [facility]. 

## 2022-07-29 ENCOUNTER — Ambulatory Visit (INDEPENDENT_AMBULATORY_CARE_PROVIDER_SITE_OTHER): Payer: 59

## 2022-07-29 ENCOUNTER — Ambulatory Visit (INDEPENDENT_AMBULATORY_CARE_PROVIDER_SITE_OTHER): Payer: 59 | Admitting: Orthopedic Surgery

## 2022-07-29 DIAGNOSIS — M25572 Pain in left ankle and joints of left foot: Secondary | ICD-10-CM | POA: Diagnosis not present

## 2022-07-29 DIAGNOSIS — M25512 Pain in left shoulder: Secondary | ICD-10-CM

## 2022-07-29 DIAGNOSIS — B351 Tinea unguium: Secondary | ICD-10-CM

## 2022-07-29 DIAGNOSIS — G8929 Other chronic pain: Secondary | ICD-10-CM | POA: Diagnosis not present

## 2022-07-30 DIAGNOSIS — M25561 Pain in right knee: Secondary | ICD-10-CM | POA: Diagnosis not present

## 2022-07-30 DIAGNOSIS — J90 Pleural effusion, not elsewhere classified: Secondary | ICD-10-CM | POA: Diagnosis not present

## 2022-07-30 DIAGNOSIS — Z7982 Long term (current) use of aspirin: Secondary | ICD-10-CM | POA: Diagnosis not present

## 2022-07-30 DIAGNOSIS — K219 Gastro-esophageal reflux disease without esophagitis: Secondary | ICD-10-CM | POA: Diagnosis not present

## 2022-07-30 DIAGNOSIS — I120 Hypertensive chronic kidney disease with stage 5 chronic kidney disease or end stage renal disease: Secondary | ICD-10-CM | POA: Diagnosis not present

## 2022-07-30 DIAGNOSIS — Z992 Dependence on renal dialysis: Secondary | ICD-10-CM | POA: Diagnosis not present

## 2022-07-30 DIAGNOSIS — E1122 Type 2 diabetes mellitus with diabetic chronic kidney disease: Secondary | ICD-10-CM | POA: Diagnosis not present

## 2022-07-30 DIAGNOSIS — N186 End stage renal disease: Secondary | ICD-10-CM | POA: Diagnosis not present

## 2022-07-30 DIAGNOSIS — M6281 Muscle weakness (generalized): Secondary | ICD-10-CM | POA: Diagnosis not present

## 2022-07-30 DIAGNOSIS — D696 Thrombocytopenia, unspecified: Secondary | ICD-10-CM | POA: Diagnosis not present

## 2022-07-30 DIAGNOSIS — Z9181 History of falling: Secondary | ICD-10-CM | POA: Diagnosis not present

## 2022-07-30 DIAGNOSIS — M549 Dorsalgia, unspecified: Secondary | ICD-10-CM | POA: Diagnosis not present

## 2022-07-30 DIAGNOSIS — M25562 Pain in left knee: Secondary | ICD-10-CM | POA: Diagnosis not present

## 2022-07-31 DIAGNOSIS — J90 Pleural effusion, not elsewhere classified: Secondary | ICD-10-CM | POA: Diagnosis not present

## 2022-07-31 DIAGNOSIS — K219 Gastro-esophageal reflux disease without esophagitis: Secondary | ICD-10-CM | POA: Diagnosis not present

## 2022-07-31 DIAGNOSIS — N186 End stage renal disease: Secondary | ICD-10-CM | POA: Diagnosis not present

## 2022-07-31 DIAGNOSIS — Z7982 Long term (current) use of aspirin: Secondary | ICD-10-CM | POA: Diagnosis not present

## 2022-07-31 DIAGNOSIS — I120 Hypertensive chronic kidney disease with stage 5 chronic kidney disease or end stage renal disease: Secondary | ICD-10-CM | POA: Diagnosis not present

## 2022-07-31 DIAGNOSIS — M25562 Pain in left knee: Secondary | ICD-10-CM | POA: Diagnosis not present

## 2022-07-31 DIAGNOSIS — Z992 Dependence on renal dialysis: Secondary | ICD-10-CM | POA: Diagnosis not present

## 2022-07-31 DIAGNOSIS — D696 Thrombocytopenia, unspecified: Secondary | ICD-10-CM | POA: Diagnosis not present

## 2022-07-31 DIAGNOSIS — M6281 Muscle weakness (generalized): Secondary | ICD-10-CM | POA: Diagnosis not present

## 2022-07-31 DIAGNOSIS — M549 Dorsalgia, unspecified: Secondary | ICD-10-CM | POA: Diagnosis not present

## 2022-07-31 DIAGNOSIS — M25561 Pain in right knee: Secondary | ICD-10-CM | POA: Diagnosis not present

## 2022-07-31 DIAGNOSIS — Z9181 History of falling: Secondary | ICD-10-CM | POA: Diagnosis not present

## 2022-07-31 DIAGNOSIS — E1122 Type 2 diabetes mellitus with diabetic chronic kidney disease: Secondary | ICD-10-CM | POA: Diagnosis not present

## 2022-08-01 ENCOUNTER — Encounter: Payer: Self-pay | Admitting: Orthopedic Surgery

## 2022-08-01 DIAGNOSIS — M25512 Pain in left shoulder: Secondary | ICD-10-CM | POA: Diagnosis not present

## 2022-08-01 DIAGNOSIS — N186 End stage renal disease: Secondary | ICD-10-CM | POA: Diagnosis not present

## 2022-08-01 DIAGNOSIS — Z992 Dependence on renal dialysis: Secondary | ICD-10-CM | POA: Diagnosis not present

## 2022-08-01 DIAGNOSIS — G8929 Other chronic pain: Secondary | ICD-10-CM | POA: Diagnosis not present

## 2022-08-01 MED ORDER — LIDOCAINE HCL 1 % IJ SOLN
5.0000 mL | INTRAMUSCULAR | Status: AC | PRN
  Administered 2022-08-01: 5 mL

## 2022-08-01 MED ORDER — METHYLPREDNISOLONE ACETATE 40 MG/ML IJ SUSP
40.0000 mg | INTRAMUSCULAR | Status: AC | PRN
  Administered 2022-08-01: 40 mg via INTRA_ARTICULAR

## 2022-08-01 NOTE — Progress Notes (Signed)
Office Visit Note   Patient: [name]           Date of Birth: 10/26/1940           MRN: [id number] Visit Date: 07/29/2022              Requested by: [name], MD [address] [place],  [place] [address] PCP: [name], MD  Chief Complaint  Patient presents with   Left Shoulder - Pain   Left Ankle - Pain      HPI: Patient is a 82 year old woman who presents with 3 separate issues.  #1 patient states she has had chronic left shoulder pain that is getting worse.  She reports decreasing range of motion and difficulty completing dialysis secondary to pain.  Patient states that she fell out of bed and chipped her left ankle she is currently in a fracture boot.  #3 patient has thickened discolored onychomycotic nails that she cannot trim on her own.  Assessment & Plan: Visit Diagnoses:  1. Pain in left ankle and joints of left foot   2. Chronic left shoulder pain   3. Onychomycosis     Plan: Patient has a small avulsion off the distal fibula.  Recommended using the fracture boot as she needs for comfort may wean out of the fracture boot as symptoms improve.  Nails were trimmed x 6.  Patient underwent a subacromial injection for the left shoulder.  Follow-Up Instructions: Return if symptoms worsen or fail to improve.   Ortho Exam  Patient is alert, oriented, no adenopathy, well-dressed, normal affect, normal respiratory effort. Examination patient has painful abduction and flexion to 90 degrees.  She has pain with Neer and Hawkins impingement test pain to palpation over the biceps and pain over the [facility] joint.  Examination of the left ankle she is tender to palpation over the distal fibula.  No ecchymosis or bruising.  Examination of her nails she has thickened discolored onychomycotic nails x 6 and the nails were trimmed x 6 without complication.  Imaging: No results found. No images are attached to the encounter.  Labs: Lab Results  Component Value Date    HGBA1C 8.8 (H) 06/02/2022   HGBA1C 8.1 (A) 02/03/2022   HGBA1C 8.7 (A) 07/25/2021   ESRSEDRATE 42 (H) 01/06/2020   CRP 20.3 (H) 01/06/2020   REPTSTATUS 06/23/2022 FINAL 06/18/2022   GRAMSTAIN NO WBC SEEN NO ORGANISMS SEEN  06/18/2022   CULT  06/18/2022    No growth aerobically or anaerobically. Performed at [facility], [place] [address], [place], [place] [address]      Lab Results  Component Value Date   ALBUMIN 2.3 (L) 06/30/2022   ALBUMIN 2.1 (L) 06/27/2022   ALBUMIN 2.3 (L) 06/26/2022    Lab Results  Component Value Date   MG 1.6 (L) 06/21/2022   MG 1.7 06/19/2022   MG 1.8 06/15/2022   No results found for: "VD25OH"  No results found for: "PREALBUMIN"    Latest Ref Rng & Units 07/04/2022    7:03 AM 06/30/2022    7:10 AM 06/27/2022    7:54 AM  CBC EXTENDED  WBC 4.0 - 10.5 K/uL 14.7  9.3  11.4   RBC 3.87 - 5.11 MIL/uL 3.33  2.81  2.56   Hemoglobin 12.0 - 15.0 g/dL 10.2  8.7  8.0   HCT 36.0 - 46.0 % 32.6  26.5  24.0   Platelets 150 - 400 K/uL 172  180  167      There is no height or weight on file to calculate BMI.  Orders:  Orders Placed This Encounter  Procedures   Large Joint Inj: L subacromial bursa   XR Shoulder Left   XR Ankle 2 Views Left   No orders of the defined types were placed in this encounter.    Procedures: Large Joint Inj: L subacromial bursa on 08/01/2022 2:45 PM Indications: diagnostic evaluation and pain Details: 22 G 1.5 in needle, posterior approach  Arthrogram: No  Medications: 5 mL lidocaine 1 %; 40 mg methylPREDNISolone acetate 40 MG/ML Outcome: tolerated well, no immediate complications Procedure, treatment alternatives, risks and benefits explained, specific risks discussed. Consent was given by the patient. Immediately prior to procedure a time out was called to verify the correct patient, procedure, equipment, support staff and site/side marked as required. Patient was prepped and draped in the usual sterile fashion.       Clinical Data: No additional findings.  ROS:  All other systems negative, except as noted in the HPI. Review of Systems  Objective: Vital Signs: There were no vitals taken for this visit.  Specialty Comments:  MRI LUMBAR SPINE WITHOUT CONTRAST   TECHNIQUE: Multiplanar, multisequence MR imaging of the lumbar spine was performed. No intravenous contrast was administered.   COMPARISON:  CT 03/23/2020   FINDINGS: Segmentation:  Standard.   Alignment: Grade 1 anterolisthesis of L4 on L5. Slight lumbar dextrocurvature.   Vertebrae: No fracture, evidence of discitis, or bone lesion. Superior endplate Schmorl's node of L1.   Conus medullaris and cauda equina: Conus extends to the T12-L1 level. Conus appears normal. Slight bunching of the cauda equina nerve roots above the L4-5 level of stenosis.   Paraspinal and other soft tissues: Colonic diverticulosis. Cortically based T2 hyperintense lesionswithin the bilateral kidneys, incompletely characterized, but most likely represent cysts.   Disc levels:   T12-L1: Minimal disc bulge. Unremarkable facet joints. No foraminal or canal stenosis.   L1-L2: Unremarkable disc. Minimal facet arthropathy. No foraminal or canal stenosis.   L2-L3: Mild annular disc bulge. Mild bilateral facet arthropathy. No canal stenosis. Borderline-mild right foraminal stenosis.   L3-L4: No disc protrusion. Mild bilateral facet arthropathy. No canal stenosis. Borderline-mild bilateral foraminal stenosis.   L4-L5: Anterolisthesis with disc uncovering and diffuse disc bulge. Severe bilateral facet arthropathy and ligamentum flavum buckling. Findings result in severe canal stenosis with severe left and moderate right foraminal stenosis.   L5-S1: Diffuse disc bulge, eccentric to the right with endplate osteophytic ridging. Mild bilateral facet arthropathy. Moderate right and mild left foraminal stenosis. No canal stenosis.    IMPRESSION: 1. Multilevel lumbar spondylosis as described above, most pronounced at L4-5 where there is severe canal stenosis with severe left and moderate right foraminal stenosis. 2. Moderate right and mild left foraminal stenosis at L5-S1.     Electronically Signed   By: Davina Poke D.O.   On: 08/24/2021 20:31  PMFS History: Patient Active Problem List   Diagnosis Date Noted   Acute blood loss anemia 06/22/2022   Hemothorax 06/22/2022   Hemothorax on left 06/21/2022   Loculated pleural effusion 06/21/2022   Need for management of chest tube 06/20/2022   Delirium 06/14/2022   GERD (gastroesophageal reflux disease) 06/10/2022   CAP (community acquired pneumonia) 06/10/2022   ESRD (end stage renal disease) ([place]) 06/09/2022   DMII (diabetes mellitus, type 2) ([place]) 06/09/2022   Multifocal pneumonia 06/02/2022   Pleural effusion 06/02/2022   End stage renal disease on  dialysis ([place]) 06/02/2022   Spinal stenosis of lumbar region 01/07/2022   Schatzki's ring 10/15/2021   Esophageal dysphagia 10/15/2021   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   Hypertensive heart and CKD, ESRD on dialysis ([place]) 11/09/2020   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 due to type 2 diabetes mellitus ([place]) 11/09/2020   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 diabetes mellitus ([place]) 11/09/2020   Dependence on renal dialysis ([place]) 11/09/2020   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 disease ([place]) 11/09/2020   Hyperlipidemia associated with type 2 diabetes mellitus ([place]) 11/09/2020   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 ([place]) 11/09/2020   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 ([place]) 11/09/2020   Chronic constipation 11/09/2020   Vitamin B 12 deficiency 11/09/2020   Acute respiratory failure with hypoxia ([place]) 10/26/2020   Elevated troponin 10/26/2020   Volume overload 10/26/2020   Hypertensive urgency 10/26/2020   History of CVA  (cerebrovascular accident) 01/05/2020   Thrombocytopenia ([place]) 01/05/2020   History of colonic polyps 04/01/2018   Acute bronchitis 10/15/2017   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 ([place])    HCAP (healthcare-associated pneumonia) 05/29/2015   Hyperkalemia 05/29/2015   ESRD (end stage renal disease) on dialysis ([place])    Gastro-esophageal reflux disease without esophagitis 09/29/2014   Encounter for adequacy testing for hemodialysis ([place]) 01/17/2014   Mechanical complication of other vascular device, implant, and graft 01/17/2014   Disturbances of vision, late effect of stroke 11/03/2013   Right hemiparesis ([place]) 08/19/2013   CVA (cerebral infarction) 07/20/2013   Gastroparesis due to DM ([place]) 07/01/2013   Type 2 diabetes mellitus with hyperglycemia ([place]) 06/23/2013   Fall 05/25/2012   HLD (hyperlipidemia) 05/20/2006   Hereditary and idiopathic peripheral neuropathy 05/20/2006   Essential hypertension 05/20/2006   Past Medical History:  Diagnosis Date   Anemia    Cataract    Chronic kidney disease    CVA (cerebral infarction)    Diabetes mellitus with ESRD (end-stage renal disease) ([place])    Type 2   Dialysis patient ([place])    M, W, F   Fistula    R arm   GERD (gastroesophageal reflux disease)    Hypertension    Renal disorder    Shortness of breath    Stroke ([place])    right side weakness    Family History  Problem Relation Age of Onset   Cancer Sister    Cancer Brother    Anesthesia problems Neg Hx    Hypotension Neg Hx    Malignant hyperthermia Neg Hx    Pseudochol deficiency Neg Hx     Past Surgical History:  Procedure Laterality Date   AMPUTATION Right 01/13/2020   Procedure: AMPUTATION RIGHT FOOT 1ST RAY;  Surgeon: Newt Minion, MD;  Location: [facility];  Service: Orthopedics;  Laterality: Right;   AV FISTULA PLACEMENT Right 09/08/2013   Procedure: CREATION OF RIGHT BRACHIAL CEPHALIC ARTERIOVENOUS FISTULA ;  Surgeon:  Mal Misty, MD;  Location: [facility];  Service: Vascular;  Laterality: Right;   [place] Right 01/26/2014   Procedure: Right Arm BASILIC VEIN TRANSPOSITION;  Surgeon: Mal Misty, MD;  Location: [facility];  Service: Vascular;  Laterality: Right;   BIOPSY  09/10/2021   Procedure: BIOPSY;  Surgeon: Harvel Quale, MD;  [facility];  Service: Gastroenterology;;  gastric   BIOPSY  10/07/2021   Procedure: BIOPSY;  Surgeon: Rush Landmark,  Telford Nab., MD;  Location[facility];  Service: Gastroenterology;;   BIOPSY  04/17/2022   Procedure: BIOPSY;  Surgeon: Irving Copas., MD;  Location: [facility];  Service: Gastroenterology;;   CATARACT EXTRACTION W/PHACO  11/20/2011   Procedure: CATARACT EXTRACTION PHACO AND INTRAOCULAR LENS PLACEMENT ([place]);  Surgeon: Tonny Branch, MD;  Location: [facility];  Service: Ophthalmology;  Laterality: Right;  CDE 18.82   CATARACT EXTRACTION W/PHACO Left 11/18/2012   Procedure: CATARACT EXTRACTION PHACO AND INTRAOCULAR LENS PLACEMENT (IOC);  Surgeon: Tonny Branch, MD;  Location: [facility];  Service: Ophthalmology;  Laterality: Left;  CDE: 18.97   COLONOSCOPY N/A 02/09/2013   Procedure: COLONOSCOPY;  Surgeon: Rogene Houston, MD;  [facility];  Service: Endoscopy;  Laterality: N/A;  305-moved to 220 Ann to notify pt   COLONOSCOPY N/A 07/08/2018   Procedure: COLONOSCOPY;  Surgeon: Rogene Houston, MD;  [facility];  Service: Endoscopy;  Laterality: N/A;  930   ENDOSCOPIC MUCOSAL RESECTION N/A 10/07/2021   Procedure: ENDOSCOPIC MUCOSAL RESECTION;  Surgeon: Rush Landmark Telford Nab., MD;  [facility];  Service: Gastroenterology;  Laterality: N/A;   ESOPHAGOGASTRODUODENOSCOPY N/A 04/17/2022   Procedure: ESOPHAGOGASTRODUODENOSCOPY (EGD);  Surgeon: Irving Copas., MD;  Location: [facility];  Service: Gastroenterology;  Laterality: N/A;   ESOPHAGOGASTRODUODENOSCOPY (EGD) WITH PROPOFOL N/A 09/10/2021    Procedure: ESOPHAGOGASTRODUODENOSCOPY (EGD) WITH PROPOFOL;  Surgeon: Harvel Quale, MD;  [facility];  Service: Gastroenterology;  Laterality: N/A;   ESOPHAGOGASTRODUODENOSCOPY (EGD) WITH PROPOFOL N/A 10/07/2021   Procedure: ESOPHAGOGASTRODUODENOSCOPY (EGD) WITH PROPOFOL;  Surgeon: Rush Landmark Telford Nab., MD;  [facility];  Service: Gastroenterology;  Laterality: N/A;   EUS N/A 10/07/2021   Procedure: UPPER ENDOSCOPIC ULTRASOUND (EUS) RADIAL;  Surgeon: Irving Copas., MD;  [facility];  Service: Gastroenterology;  Laterality: N/A;   GASTRIC VARICES BANDING  10/07/2021   Procedure: duodenal tumor BANDING;  Surgeon: Rush Landmark Telford Nab., MD;  Location: [facility];  Service: Gastroenterology;;  with emr kit   HEMOSTASIS CLIP PLACEMENT  10/07/2021   Procedure: HEMOSTASIS CLIP PLACEMENT;  Surgeon: Irving Copas., MD;  Location: [facility];  Service: Gastroenterology;;   INSERTION OF DIALYSIS CATHETER Right 06/24/2013   Procedure: INSERTION OF DIALYSIS CATHETER: Ultrasound guided;  Surgeon: Serafina Mitchell, MD;  Location: [facility];  Service: Vascular;  Laterality: Right;   LIGATION OF ARTERIOVENOUS  FISTULA Right 01/26/2014   Procedure: LIGATION OF ARTERIOVENOUS  FISTULA;  Surgeon: Mal Misty, MD;  Location: [facility];  Service: Vascular;  Laterality: Right;   LOOP RECORDER IMPLANT N/A 07/21/2013   Procedure: LOOP RECORDER IMPLANT;  Surgeon: Evans Lance, MD;  Location: [facility];  Service: Cardiovascular;  Laterality: N/A;   POLYPECTOMY  07/08/2018   Procedure: POLYPECTOMY;  Surgeon: Rogene Houston, MD;  [facility];  Service: Endoscopy;;  Descending colon polyps x 2    SUBMUCOSAL LIFTING INJECTION  10/07/2021   Procedure: SUBMUCOSAL LIFTING INJECTION;  Surgeon: Irving Copas., MD;  [facility];  Service: Gastroenterology;;   TEE WITHOUT CARDIOVERSION N/A 07/21/2013   Procedure: TRANSESOPHAGEAL ECHOCARDIOGRAM  (TEE);  Surgeon: Dorothy Spark, MD;  Location: [facility];  Service: Cardiovascular;  Laterality: N/A;   UPPER ESOPHAGEAL ENDOSCOPIC ULTRASOUND (EUS) N/A 04/17/2022   Procedure: UPPER ESOPHAGEAL ENDOSCOPIC ULTRASOUND (EUS);  Surgeon: Irving Copas., MD;  Location: [facility];  Service: Gastroenterology;  Laterality: N/A;   Social History   Occupational History   Not on file  Tobacco  Use   Smoking status: Never   Smokeless tobacco: Never  Vaping Use   Vaping Use: Never used  Substance and Sexual Activity   Alcohol use: No   Drug use: No   Sexual activity: Not on file

## 2022-08-04 DIAGNOSIS — N186 End stage renal disease: Secondary | ICD-10-CM | POA: Diagnosis not present

## 2022-08-04 DIAGNOSIS — Z992 Dependence on renal dialysis: Secondary | ICD-10-CM | POA: Diagnosis not present

## 2022-08-05 ENCOUNTER — Ambulatory Visit (INDEPENDENT_AMBULATORY_CARE_PROVIDER_SITE_OTHER): Payer: 59 | Admitting: Gastroenterology

## 2022-08-05 ENCOUNTER — Encounter (INDEPENDENT_AMBULATORY_CARE_PROVIDER_SITE_OTHER): Payer: Self-pay | Admitting: Gastroenterology

## 2022-08-05 VITALS — BP 124/64 | HR 77 | Temp 97.8°F | Ht 67.0 in | Wt 156.2 lb

## 2022-08-05 DIAGNOSIS — D649 Anemia, unspecified: Secondary | ICD-10-CM | POA: Diagnosis not present

## 2022-08-05 NOTE — Progress Notes (Addendum)
Referring Provider: [name], MD Primary Care Physician:  [name], MD Primary GI Physician: Jenetta Downer   Chief Complaint  Patient presents with   Anemia    Patient arrives with daughter [name]. Patient referred for low hemoglobin at dialysis.    HPI:   [name] is a 82 y.o. female with past medical history of anemia, CKD, CVA, diabetes, GERD, hypertension   Patient presenting today for anemia   Last seen may 2023, at that time following up for recent hospitalization on 09/10/21 after presenting to ED for food impaction.  underwent EGD under [facility] with findings as outlined below, referred to Dr. 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 Patient underwent EUS on 10/07/21 with resection of tumor, chromagranin A level: 634.2 on 10/10/21, patient scheduled for CT A/P w/contrast (11/21/21) and tentative recall for EUS in 6 months, recommendations to repeat Chromagranin A levels in 2 months and proceed with previously scheduled CT A/P, consider PET scan if CT abnormal. at last visit  swallowing much improved, no recurrence of dysphagia. maintained on omeprazole 53m twice a day.   Recommended to continue PPI, continue to follow with Dr. MRush Landmarkfor neuroendocrine tumor, avoid NSAIDs.   Pt had elevated Chromagranin A off PPI therapy in August, EGD/EUS done 04/17/22 as outlined below, plan for repeat EGD/EUS in 1 year. DETECTNET PET-CT date to be determined.   Last hgb 7.3, on 2/12 with iron sat 46%, Iron 90, TIBC 197 MCV 100.7 Iron studies WNL with elevated ferritin of 1385 on labs in January with iron 29, TIBC 157, iron sat 18%, MCV 98.2  Present:  Patient arrives with her daughter who helps provide history. Patient denies abdominal pain, rectal bleeding or melena. Daughter reports that patient had a blood transfusion in December when she was hospitalized for pneumonia. Patient reports occasional SOB prior  to dialysis usually. Denies dizziness. She does have some fatigue but not worse than usual. Weight appears stable from chart review. Appetite is good. No nausea or vomiting.    Last EGD/EUS: nov 2023  EGD impression: (biopsy negative) - No gross lesions in the proximal esophagus and in   the mid esophagus.  - White nummular lesions in esophageal mucosa in the distal esophagus. Biopsied to rule out Candida.                           - Tortuous esophagus throughout.                           - Non-obstructing Schatzki ring.                           - 4 cm hiatal hernia.                           - Erythematous mucosa in the antrum -previously                            biopsied and negative for HP so not rebiopsied.                           - Duodenal scar in the bulb, mostly flat but 1  small 1 mm appearing polypoid region was noted.                            This was biopsied to rule out recurrent/persistent                            neuroendocrine tumor.                           - No other gross lesions in the duodenal bulb, in                            the first portion of the duodenum and in the second                            portion of the duodenum.                           EUS impression:                           - No evidence of any intramural lesion noted in the                            duodenal bulb and apex of the duodenum.                           - No abnormality noted of the wall of the stomach                            distally.                           - No malignant-appearing lymph nodes were                            visualized in the celiac region (level 20),                            peripancreatic region and porta hepatis region Last Colonoscopy:07/08/18 Diverticulosis in the entire examined colon. - Two 5 to 6 mm polyps in the proximal descending colon, removed with a cold snare. Resected and retrieved. - External  hemorrhoids. EGD/EUS: 10/07/21  EGD Impression: Tortuous esophagus. Low-grade of narrowing Schatzki ring - disrupted. Z-line regular, 36 cm from the incisors. - 4 cm hiatal hernia. - Gastritis - previously HP negative on biopsies. J-shaped deformity of the entire stomach. Submucosal nodule found in the duodenum. After EUS, and based on prior pathology, Cap-Lift-Band EMR performed. Clips (MR conditional) were placed. - No other gross lesions in the duodenal bulb, in the first portion of the duodenum and in the second portion of the duodenum. EUS Impression: - An intramural (subepithelial) lesion was found in the duodenal bulb. The lesion appeared to originate from within the deep mucosa (Layer 2). A tissue diagnosis was obtained prior to this exam. This is consistent with previously biopsied neuroendocrine tumor. - No malignant-appearing lymph nodes  were visualized in the gastrohepatic ligament (level 18), perigastric region, porta hepatis region and upper-abdominal periaortic region. - Multiple stones were visualized endosonographically in the gallbladder.   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   Dialysis patient ([place])    M, W, F   Fistula    R arm   GERD (gastroesophageal reflux disease)    Hypertension    Renal disorder    Shortness of breath    Stroke [facility])    right side weakness    Past Surgical History:  Procedure Laterality Date   AMPUTATION Right 01/13/2020   Procedure: AMPUTATION RIGHT FOOT 1ST RAY;  Surgeon: Newt Minion, MD;  Location: [place];  Service: Orthopedics;  Laterality: Right;   AV FISTULA PLACEMENT Right 09/08/2013   Procedure: CREATION OF RIGHT BRACHIAL CEPHALIC ARTERIOVENOUS FISTULA ;  Surgeon: Mal Misty, MD;  Location: [place];  Service: Vascular;  Laterality: Right;   [place]  Right 01/26/2014   Procedure: Right Arm BASILIC VEIN TRANSPOSITION;  Surgeon: Mal Misty, MD;  Location: [place];  Service: Vascular;  Laterality: Right;   BIOPSY  09/10/2021   Procedure: BIOPSY;  Surgeon: Harvel Quale, MD;  [facility];  Service: Gastroenterology;;  gastric   BIOPSY  10/07/2021   Procedure: BIOPSY;  Surgeon: Irving Copas., MD;  Location[facility];  Service: Gastroenterology;;   BIOPSY  04/17/2022   Procedure: BIOPSY;  Surgeon: Irving Copas., MD;  Location: [facility];  Service: Gastroenterology;;   CATARACT EXTRACTION W/PHACO  11/20/2011   Procedure: CATARACT EXTRACTION PHACO AND INTRAOCULAR LENS PLACEMENT ([place]);  Surgeon: Tonny Branch, MD;  [facility];  Service: Ophthalmology;  Laterality: Right;  CDE 18.82   CATARACT EXTRACTION W/PHACO Left 11/18/2012   Procedure: CATARACT EXTRACTION PHACO AND INTRAOCULAR LENS PLACEMENT ([facility]);  Surgeon: Tonny Branch, MD;  [facility]: [facility];  Service: Ophthalmology;  Laterality: Left;  CDE: 18.97   COLONOSCOPY N/A 02/09/2013   Procedure: COLONOSCOPY;  Surgeon: Rogene Houston, MD;  [facility];  Service: Endoscopy;  Laterality: N/A;  305-moved to 220 Ann to notify pt   COLONOSCOPY N/A 07/08/2018   Procedure: COLONOSCOPY;  Surgeon: Rogene Houston, MD;  [facility];  Service: Endoscopy;  Laterality: N/A;  930   ENDOSCOPIC MUCOSAL RESECTION N/A 10/07/2021   Procedure: ENDOSCOPIC MUCOSAL RESECTION;  Surgeon: Rush Landmark Telford Nab., MD;  [facility];  Service: Gastroenterology;  Laterality: N/A;   ESOPHAGOGASTRODUODENOSCOPY N/A 04/17/2022   Procedure: ESOPHAGOGASTRODUODENOSCOPY (EGD);  Surgeon: Irving Copas., MD;  Location: [facility];  Service: Gastroenterology;  Laterality: N/A;   ESOPHAGOGASTRODUODENOSCOPY (EGD) WITH PROPOFOL N/A 09/10/2021   Procedure: ESOPHAGOGASTRODUODENOSCOPY (EGD) WITH PROPOFOL;  Surgeon: Harvel Quale, MD;  [facility];  Service: Gastroenterology;  Laterality: N/A;   ESOPHAGOGASTRODUODENOSCOPY (EGD) WITH PROPOFOL N/A 10/07/2021   Procedure: ESOPHAGOGASTRODUODENOSCOPY (EGD) WITH PROPOFOL;  Surgeon: Rush Landmark Telford Nab., MD;  [facility];  Service: Gastroenterology;  Laterality: N/A;   EUS N/A 10/07/2021   Procedure: UPPER ENDOSCOPIC ULTRASOUND (EUS) RADIAL;  Surgeon: Irving Copas., MD;  [facility];  Service: Gastroenterology;  Laterality: N/A;   GASTRIC VARICES BANDING  10/07/2021   Procedure: duodenal tumor BANDING;  Surgeon: Rush Landmark Telford Nab., MD;  [facility];  Service: Gastroenterology;;  with emr kit   HEMOSTASIS CLIP PLACEMENT  10/07/2021   Procedure: HEMOSTASIS CLIP PLACEMENT;  Surgeon: Irving Copas., MD;  Location[facility];  Service: Gastroenterology;;   INSERTION OF DIALYSIS CATHETER Right 06/24/2013   Procedure: INSERTION OF DIALYSIS CATHETER: Ultrasound guided;  Surgeon: Serafina Mitchell, MD;  Location: [place];  Service: Vascular;  Laterality: Right;   LIGATION OF ARTERIOVENOUS  FISTULA Right 01/26/2014   Procedure: LIGATION OF ARTERIOVENOUS  FISTULA;  Surgeon: Mal Misty, MD;  Location: [place];  Service: Vascular;  Laterality: Right;   LOOP RECORDER IMPLANT N/A 07/21/2013   Procedure: LOOP RECORDER IMPLANT;  Surgeon: Evans Lance, MD;  [facility]: [facility];  Service: Cardiovascular;  Laterality: N/A;   POLYPECTOMY  07/08/2018   Procedure: POLYPECTOMY;  Surgeon: Rogene Houston, MD;  [facility];  Service: Endoscopy;;  Descending colon polyps x 2    SUBMUCOSAL LIFTING INJECTION  10/07/2021   Procedure: SUBMUCOSAL LIFTING INJECTION;  Surgeon: Irving Copas., MD;  [facility];  Service: Gastroenterology;;   TEE WITHOUT CARDIOVERSION N/A 07/21/2013   Procedure: TRANSESOPHAGEAL ECHOCARDIOGRAM (TEE);  Surgeon: Dorothy Spark, MD;  Location: [place];  Service: Cardiovascular;  Laterality: N/A;    UPPER ESOPHAGEAL ENDOSCOPIC ULTRASOUND (EUS) N/A 04/17/2022   Procedure: UPPER ESOPHAGEAL ENDOSCOPIC ULTRASOUND (EUS);  Surgeon: Irving Copas., MD;  Location: [facility];  Service: Gastroenterology;  Laterality: N/A;    Current Outpatient Medications  Medication Sig Dispense Refill   albuterol (VENTOLIN HFA) 108 (90 Base) MCG/ACT inhaler Inhale 2 puffs into the lungs every 6 (six) hours as needed for wheezing or shortness of breath. 1 each 0   aspirin 325 MG tablet Take 650 mg by mouth daily.     BD INSULIN SYRINGE U/F 31G X 5/16" 0.3 ML MISC Inject 1 each into the skin 2 (two) times daily. as directed 100 each 0   benzonatate (TESSALON) 100 MG capsule Take 1 capsule (100 mg total) by mouth 3 (three) times daily as needed for cough. 20 capsule 0   cyanocobalamin (,VITAMIN B-12,) 1000 MCG/ML injection Inject 1,000 mcg into the muscle every 30 (thirty) days.  11   diclofenac Sodium (VOLTAREN) 1 % GEL Apply 4 g topically 4 (four) times daily as needed. Apply to bilateral knees 100 g 0   insulin aspart protamine- aspart (NOVOLOG MIX 70/30) (70-30) 100 UNIT/ML injection Inject 0.03 mLs (3 Units total) into the skin 2 (two) times daily with a meal. 10 mL 0   multivitamin (RENA-VIT) TABS tablet Take 1 tablet by mouth at bedtime. 30 tablet 0   omeprazole (PRILOSEC) 40 MG capsule TAKE ONE CAPSULE BY MOUTH TWICE DAILY BEFORE A meal (Patient taking differently: Take 40 mg by mouth in the morning and at bedtime.) 180 capsule 3   QUEtiapine (SEROQUEL) 25 MG tablet Take 0.5 tablets (12.5 mg total) by mouth at bedtime.     sevelamer carbonate (RENVELA) 800 MG tablet Take 1 tablet (800 mg total) by mouth 2 (two) times daily before lunch and supper. 60 tablet 0   No current facility-administered medications for this visit.    Allergies as of 08/05/2022 - Review Complete 08/05/2022  Allergen Reaction Noted   Ambien [zolpidem] Other (See Comments) 07/12/2013   Reglan [metoclopramide] Other (See  Comments) 08/02/2014    Family History  Problem Relation Age of Onset   Cancer Sister    Cancer Brother    Anesthesia problems Neg Hx  Hypotension Neg Hx    Malignant hyperthermia Neg Hx    Pseudochol deficiency Neg Hx     Social History   Socioeconomic History   Marital status: Widowed    Spouse name: Not on file   Number of children: Not on file   Years of education: Not on file   Highest education level: Not on file  Occupational History   Not on file  Tobacco Use   Smoking status: Never    Passive exposure: Never   Smokeless tobacco: Never  Vaping Use   Vaping Use: Never used  Substance and Sexual Activity   Alcohol use: No   Drug use: No   Sexual activity: Not on file  Other Topics Concern   Not on file  Social History Narrative   ** Merged History Encounter **       Social Determinants of Health   Financial Resource Strain: Not on file  Food Insecurity: No Food Insecurity (06/13/2022)   Hunger Vital Sign    Worried About Running Out of Food in the Last Year: Never true    Ran Out of Food in the Last Year: Never true  Transportation Needs: No Transportation Needs (06/13/2022)   PRAPARE - Hydrologist (Medical): No    Lack of Transportation (Non-Medical): No  Physical Activity: Not on file  Stress: Not on file  Social Connections: Not on file    Review of systems General: negative for malaise, night sweats, fever, chills, weight loss Neck: Negative for lumps, goiter, pain and significant neck swelling Resp: Negative for cough, wheezing, dyspnea at rest CV: Negative for chest pain, leg swelling, palpitations, orthopnea GI: denies melena, hematochezia, nausea, vomiting, diarrhea, constipation, dysphagia, odyonophagia, early satiety or unintentional weight loss.  MSK: Negative for joint pain or swelling, back pain, and muscle pain. Derm: Negative for itching or rash Psych: Denies depression, anxiety, memory loss, confusion.  No homicidal or suicidal ideation.  Heme: Negative for prolonged bleeding, bruising easily, and swollen nodes. Endocrine: Negative for cold or heat intolerance, polyuria, polydipsia and goiter. Neuro: negative for tremor, gait imbalance, syncope and seizures. The remainder of the review of systems is noncontributory.  Physical Exam: BP 124/64 (BP Location: Left Arm, Patient Position: Sitting, Cuff Size: Large)   Pulse 77   Temp 97.8 F (36.6 C) (Oral)   Ht 5' 7"$  (1.702 m)   Wt 156 lb 3.2 oz (70.9 kg)   BMI 24.46 kg/m  General:   Alert and oriented. No distress noted. Pleasant and cooperative.  Head:  Normocephalic and atraumatic. Eyes:  Conjuctiva clear without scleral icterus. Mouth:  Oral mucosa pink and moist. Good dentition. No lesions. Heart: Normal rate and rhythm, s1 and s2 heart sounds present.  Lungs: Clear lung sounds in all lobes. Respirations equal and unlabored. Abdomen:  +BS, soft, non-tender and non-distended. No rebound or guarding. No HSM or masses noted. Derm: No palmar erythema or jaundice Msk:  Symmetrical without gross deformities. Normal posture. Extremities:  Without edema. Neurologic:  Alert and  oriented x4 Psych:  Alert and cooperative. Normal mood and affect.  Invalid input(s): "6 MONTHS"   ASSESSMENT: CASAUNDRA FREUDENBERGER is a 82 y.o. female presenting today for anemia.  Presence of anemia, suspect multifactorial, though recent iron studies WNL with elevated ferritin in January. No rectal bleeding or melena. No change in bowel habits. Given multi-morbidities, would like to first recheck iron studies, B12, Folate to conservatively determine if this is in fact IDA,  and will perform occult stool cards x3. Given recent EGD in November, if iron studies reveal iron deficiency and/or stool cards are positive, will need to discuss proceeding with Colonoscopy.   EGD/EUS in November for neuroendocrine tumor did not reveal any recurrence. Last PET scan was July 2023,  consider repeat PET imaging in July 2024 but will discuss definitive timing of repeat PET with Dr. Jenetta Downer.    PLAN:  Iron studies, Folate, B12, occult stool cards x3 2. Continue PPI  3. Repeat EGD/EUS 1 year 4. PET scan date TBD (will discuss with Dr. Jenetta Downer)  5. Proceed with colonoscopy if iron studies low/FOBT is heme positive   All questions were answered, patient verbalized understanding and is in agreement with plan as outlined above.   Follow Up: 3 months   Artemis Loyal L. Alver Sorrow, MSN, APRN, AGNP-C Adult-Gerontology Nurse Practitioner [facility]  I have reviewed the note and agree with the APP's assessment as described in this progress note  Will hold off on PET CT scan until he has a repeat EGD/EUS, possibly in 05/2023.  Maylon Peppers, MD Gastroenterology and Hepatology [facility]

## 2022-08-05 NOTE — Patient Instructions (Addendum)
I would like to check some labs today to see what the iron looks like as well as B12 and folate to rule out other causes of anemia, we will also check some stool cards to look for presence of blood in the stool. If these test show iron deficiency or concern for blood loss, we will need to discuss proceeding with colonoscopy  Follow up 3 months

## 2022-08-05 NOTE — Addendum Note (Signed)
Addended by: Harvel Quale on: 08/05/2022 08:33 PM   Modules accepted: Level of Service

## 2022-08-06 ENCOUNTER — Ambulatory Visit: Payer: Medicare Other | Admitting: "Endocrinology

## 2022-08-06 DIAGNOSIS — Z992 Dependence on renal dialysis: Secondary | ICD-10-CM | POA: Diagnosis not present

## 2022-08-06 DIAGNOSIS — N186 End stage renal disease: Secondary | ICD-10-CM | POA: Diagnosis not present

## 2022-08-06 LAB — IRON,TIBC AND FERRITIN PANEL
%SAT: 25 % (calc) (ref 16–45)
Ferritin: 1365 ng/mL — ABNORMAL HIGH (ref 16–288)
Iron: 49 ug/dL (ref 45–160)
TIBC: 195 mcg/dL (calc) — ABNORMAL LOW (ref 250–450)

## 2022-08-06 LAB — B12 AND FOLATE PANEL
Folate: 23.9 ng/mL
Vitamin B-12: >2000 pg/mL — ABNORMAL HIGH (ref 200–1100)

## 2022-08-07 ENCOUNTER — Encounter: Payer: Self-pay | Admitting: "Endocrinology

## 2022-08-07 ENCOUNTER — Ambulatory Visit (INDEPENDENT_AMBULATORY_CARE_PROVIDER_SITE_OTHER): Payer: 59 | Admitting: "Endocrinology

## 2022-08-07 VITALS — BP 128/52 | HR 84 | Ht 67.0 in | Wt 157.0 lb

## 2022-08-07 DIAGNOSIS — I1311 Hypertensive heart and chronic kidney disease without heart failure, with stage 5 chronic kidney disease, or end stage renal disease: Secondary | ICD-10-CM

## 2022-08-07 DIAGNOSIS — E1122 Type 2 diabetes mellitus with diabetic chronic kidney disease: Secondary | ICD-10-CM

## 2022-08-07 DIAGNOSIS — E785 Hyperlipidemia, unspecified: Secondary | ICD-10-CM | POA: Diagnosis not present

## 2022-08-07 DIAGNOSIS — I120 Hypertensive chronic kidney disease with stage 5 chronic kidney disease or end stage renal disease: Secondary | ICD-10-CM

## 2022-08-07 DIAGNOSIS — E1169 Type 2 diabetes mellitus with other specified complication: Secondary | ICD-10-CM | POA: Diagnosis not present

## 2022-08-07 DIAGNOSIS — Z992 Dependence on renal dialysis: Secondary | ICD-10-CM | POA: Diagnosis not present

## 2022-08-07 DIAGNOSIS — N186 End stage renal disease: Secondary | ICD-10-CM | POA: Diagnosis not present

## 2022-08-07 MED ORDER — INSULIN ASPART PROT & ASPART (70-30 MIX) 100 UNIT/ML ~~LOC~~ SUSP: SUBCUTANEOUS | 0 refills | Status: DC

## 2022-08-07 NOTE — Patient Instructions (Signed)

## 2022-08-07 NOTE — Progress Notes (Signed)
08/07/2022, 12:28 PM  Endocrinology follow-up note   Subjective:    Patient ID: [name], female    DOB: 1940/12/08.  [name] is being seen in follow-up after she was treated consultation for management of currently uncontrolled symptomatic diabetes requested by  [name], MD. She is accompanied by her daughter to clinic.  Past Medical History:  Diagnosis Date   Anemia    Cataract    Chronic kidney disease    CVA (cerebral infarction)    Diabetes mellitus with ESRD (end-stage renal disease) ([place])    Type 2   Dialysis patient ([place])    M, W, F   Fistula    R arm   GERD (gastroesophageal reflux disease)    Hypertension    Renal disorder    Shortness of breath    Stroke [facility])    right side weakness    Past Surgical History:  Procedure Laterality Date   AMPUTATION Right 01/13/2020   Procedure: AMPUTATION RIGHT FOOT 1ST RAY;  Surgeon: Newt Minion, MD;  Location: [place];  Service: Orthopedics;  Laterality: Right;   AV FISTULA PLACEMENT Right 09/08/2013   Procedure: CREATION OF RIGHT BRACHIAL CEPHALIC ARTERIOVENOUS FISTULA ;  Surgeon: Mal Misty, MD;  Location: [place];  Service: Vascular;  Laterality: Right;   [place] Right 01/26/2014   Procedure: Right Arm BASILIC VEIN TRANSPOSITION;  Surgeon: Mal Misty, MD;  Location: [place];  Service: Vascular;  Laterality: Right;   BIOPSY  09/10/2021   Procedure: BIOPSY;  Surgeon: Harvel Quale, MD;  [facility];  Service: Gastroenterology;;  gastric   BIOPSY  10/07/2021   Procedure: BIOPSY;  Surgeon: Irving Copas., MD;  Location[facility];  Service: Gastroenterology;;   BIOPSY  04/17/2022   Procedure: BIOPSY;  Surgeon: Irving Copas., MD;  Location: [facility];  Service: Gastroenterology;;   CATARACT EXTRACTION W/PHACO  11/20/2011   Procedure: CATARACT EXTRACTION PHACO AND INTRAOCULAR LENS PLACEMENT ([place]);  Surgeon: Tonny Branch, MD;  [facility];  Service: Ophthalmology;  Laterality: Right;  CDE 18.82   CATARACT EXTRACTION W/PHACO Left 11/18/2012   Procedure: CATARACT EXTRACTION PHACO AND INTRAOCULAR LENS PLACEMENT (IOC);  Surgeon: Tonny Branch, MD;  [facility];  Service: Ophthalmology;  Laterality: Left;  CDE: 18.97   COLONOSCOPY N/A 02/09/2013   Procedure: COLONOSCOPY;  Surgeon: Rogene Houston, MD;  [facility];  Service: Endoscopy;  Laterality: N/A;  305-moved to 220 Ann to notify pt   COLONOSCOPY N/A 07/08/2018   Procedure: COLONOSCOPY;  Surgeon: Rogene Houston, MD;  [facility];  Service: Endoscopy;  Laterality: N/A;  930   ENDOSCOPIC MUCOSAL RESECTION N/A 10/07/2021   Procedure: ENDOSCOPIC MUCOSAL RESECTION;  Surgeon: Rush Landmark Telford Nab., MD;  [facility];  Service: Gastroenterology;  Laterality: N/A;   ESOPHAGOGASTRODUODENOSCOPY N/A 04/17/2022   Procedure: ESOPHAGOGASTRODUODENOSCOPY (EGD);  Surgeon: Irving Copas., MD;  Location: [facility];  Service: Gastroenterology;  Laterality: N/A;   ESOPHAGOGASTRODUODENOSCOPY (EGD) WITH PROPOFOL N/A 09/10/2021   Procedure: ESOPHAGOGASTRODUODENOSCOPY (EGD) WITH PROPOFOL;  Surgeon: Harvel Quale, MD;  [facility];  Service: Gastroenterology;  Laterality: N/A;   ESOPHAGOGASTRODUODENOSCOPY (EGD) WITH PROPOFOL N/A 10/07/2021   Procedure: ESOPHAGOGASTRODUODENOSCOPY (EGD) WITH PROPOFOL;  Surgeon: Rush Landmark Telford Nab., MD;  [facility];  Service: Gastroenterology;  Laterality: N/A;   EUS N/A 10/07/2021   Procedure: UPPER ENDOSCOPIC ULTRASOUND (EUS) RADIAL;  Surgeon: Rush Landmark,  Telford Nab., MD;  Location: [facility];  Service: Gastroenterology;  Laterality: N/A;   GASTRIC VARICES BANDING  10/07/2021   Procedure: duodenal tumor BANDING;  Surgeon: Rush Landmark Telford Nab., MD;  Location: [facility];  Service: Gastroenterology;;  with emr kit   HEMOSTASIS CLIP PLACEMENT  10/07/2021    Procedure: HEMOSTASIS CLIP PLACEMENT;  Surgeon: Irving Copas., MD;  Location: [facility];  Service: Gastroenterology;;   INSERTION OF DIALYSIS CATHETER Right 06/24/2013   Procedure: INSERTION OF DIALYSIS CATHETER: Ultrasound guided;  Surgeon: Serafina Mitchell, MD;  Location: [place];  Service: Vascular;  Laterality: Right;   LIGATION OF ARTERIOVENOUS  FISTULA Right 01/26/2014   Procedure: LIGATION OF ARTERIOVENOUS  FISTULA;  Surgeon: Mal Misty, MD;  Location: [place];  Service: Vascular;  Laterality: Right;   LOOP RECORDER IMPLANT N/A 07/21/2013   Procedure: LOOP RECORDER IMPLANT;  Surgeon: Evans Lance, MD;  Location: [facility];  Service: Cardiovascular;  Laterality: N/A;   POLYPECTOMY  07/08/2018   Procedure: POLYPECTOMY;  Surgeon: Rogene Houston, MD;  [facility];  Service: Endoscopy;;  Descending colon polyps x 2    SUBMUCOSAL LIFTING INJECTION  10/07/2021   Procedure: SUBMUCOSAL LIFTING INJECTION;  Surgeon: Irving Copas., MD;  [facility];  Service: Gastroenterology;;   TEE WITHOUT CARDIOVERSION N/A 07/21/2013   Procedure: TRANSESOPHAGEAL ECHOCARDIOGRAM (TEE);  Surgeon: Dorothy Spark, MD;  Location: [place];  Service: Cardiovascular;  Laterality: N/A;   UPPER ESOPHAGEAL ENDOSCOPIC ULTRASOUND (EUS) N/A 04/17/2022   Procedure: UPPER ESOPHAGEAL ENDOSCOPIC ULTRASOUND (EUS);  Surgeon: Irving Copas., MD;  Location: [facility];  Service: Gastroenterology;  Laterality: N/A;    Social History   Socioeconomic History   Marital status: Widowed    Spouse name: Not on file   Number of children: Not on file   Years of education: Not on file   Highest education level: Not on file  Occupational History   Not on file  Tobacco Use   Smoking status: Never    Passive exposure: Never   Smokeless tobacco: Never  Vaping Use   Vaping Use: Never used  Substance and Sexual Activity   Alcohol use: No   Drug use: No   Sexual activity: Not on  file  Other Topics Concern   Not on file  Social History Narrative   ** Merged History Encounter **       Social Determinants of Health   Financial Resource Strain: Not on file  Food Insecurity: No Food Insecurity (06/13/2022)   Hunger Vital Sign    Worried About Running Out of Food in the Last Year: Never true    Ran Out of Food in the Last Year: Never true  Transportation Needs: No Transportation Needs (06/13/2022)   PRAPARE - Hydrologist (Medical): No    Lack of Transportation (Non-Medical): No  Physical Activity: Not on file  Stress: Not on file  Social Connections: Not on file    Family History  Problem Relation Age of Onset   Cancer Sister    Cancer Brother    Anesthesia problems Neg Hx    Hypotension Neg Hx    Malignant hyperthermia Neg Hx    Pseudochol deficiency Neg Hx     Outpatient Encounter Medications as of 08/07/2022  Medication Sig   albuterol (VENTOLIN HFA) 108 (90 Base) MCG/ACT inhaler Inhale 2 puffs into the lungs every 6 (six) hours as needed for wheezing or shortness of breath.  aspirin 325 MG tablet Take 650 mg by mouth daily.   BD INSULIN SYRINGE U/F 31G X 5/16" 0.3 ML MISC Inject 1 each into the skin 2 (two) times daily. as directed   benzonatate (TESSALON) 100 MG capsule Take 1 capsule (100 mg total) by mouth 3 (three) times daily as needed for cough.   cyanocobalamin (,VITAMIN B-12,) 1000 MCG/ML injection Inject 1,000 mcg into the muscle every 30 (thirty) days.   diclofenac Sodium (VOLTAREN) 1 % GEL Apply 4 g topically 4 (four) times daily as needed. Apply to bilateral knees   insulin aspart protamine- aspart (NOVOLOG MIX 70/30) (70-30) 100 UNIT/ML injection Inject 20 units with breakfast and 10 units with supper if pre-meal glucose readings are above 90 ad she is eating   multivitamin (RENA-VIT) TABS tablet Take 1 tablet by mouth at bedtime.   omeprazole (PRILOSEC) 40 MG capsule TAKE ONE CAPSULE BY MOUTH TWICE DAILY  BEFORE A meal (Patient taking differently: Take 40 mg by mouth in the morning and at bedtime.)   QUEtiapine (SEROQUEL) 25 MG tablet Take 0.5 tablets (12.5 mg total) by mouth at bedtime.   sevelamer carbonate (RENVELA) 800 MG tablet Take 1 tablet (800 mg total) by mouth 2 (two) times daily before lunch and supper.   [DISCONTINUED] insulin aspart protamine- aspart (NOVOLOG MIX 70/30) (70-30) 100 UNIT/ML injection Inject 0.03 mLs (3 Units total) into the skin 2 (two) times daily with a meal.   No facility-administered encounter medications on file as of 08/07/2022.    ALLERGIES: Allergies  Allergen Reactions   Ambien [Zolpidem] Other (See Comments)    Hallucinations    Reglan [Metoclopramide] Other (See Comments)    "makes me crazy"    VACCINATION STATUS: Immunization History  Administered Date(s) Administered   Influenza Whole 03/18/2006   Influenza-Unspecified 03/13/2020   Moderna SARS-COV2 Booster Vaccination 06/20/2020   Moderna Sars-Covid-2 Vaccination 08/31/2019, 09/28/2019   Pneumococcal-Unspecified 03/16/2019    Diabetes She presents for her follow-up diabetic visit. She has type 2 diabetes mellitus. Onset time: Was diagnosed at approximate age of 66 years. Her disease course has been stable. There are no hypoglycemic associated symptoms. Pertinent negatives for hypoglycemia include no confusion, headaches, pallor or seizures. Pertinent negatives for diabetes include no blurred vision, no chest pain, no fatigue, no polydipsia, no polyphagia and no polyuria. There are no hypoglycemic complications. Symptoms are stable. Diabetic complications include nephropathy. 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 Her weight is fluctuating minimally. She is following a generally unhealthy diet. When asked about meal planning, she reported  none. She never participates in exercise. Her home blood glucose trend is fluctuating minimally. Her breakfast blood glucose range is generally >200 mg/dl. Her lunch blood glucose range is generally >200 mg/dl. Her dinner blood glucose range is generally >200 mg/dl. Her bedtime blood glucose range is generally >200 mg/dl. Her overall blood glucose range is >200 mg/dl. (She presents with a CGM device showing average blood glucose of 237, 26% time in range, 31% level 1 hyperglycemia, 41% level 2 hyperglycemia, no hypoglycemia documented or reported.  Her recent A1c was 8.8% increasing from 8.1%. ) An ACE inhibitor/angiotensin II receptor blocker is not being taken.  Hypertension This is a chronic problem. The current episode started more than 1 year ago. Pertinent negatives include no blurred vision, chest pain, headaches, palpitations or shortness of breath. Risk factors for coronary artery  disease include diabetes mellitus, obesity, post-menopausal state and sedentary lifestyle. Hypertensive end-organ damage includes kidney disease. Identifiable causes of hypertension include chronic renal disease.     Review of Systems  Constitutional:  Negative for chills, fatigue, fever and unexpected weight change.  HENT:  Negative for trouble swallowing and voice change.   Eyes:  Negative for blurred vision and visual disturbance.  Respiratory:  Negative for cough, shortness of breath and wheezing.   Cardiovascular:  Negative for chest pain, palpitations and leg swelling.  Gastrointestinal:  Negative for diarrhea, nausea and vomiting.  Endocrine: Negative for cold intolerance, heat intolerance, polydipsia, polyphagia and polyuria.  Musculoskeletal:  Negative for arthralgias and myalgias.  Skin:  Negative for color change, pallor, rash and wound.  Neurological:  Negative for seizures and headaches.  Psychiatric/Behavioral:  Negative for confusion and suicidal ideas.     Objective:       08/07/2022   11:21  AM 08/05/2022    8:15 AM 07/04/2022    9:30 AM  Vitals with BMI  Height 5' 7"$  5' 7"$    Weight 157 lbs 156 lbs 3 oz   BMI 0000000 0000000   Systolic 0000000 [id number]  Diastolic 52 64 80  Pulse 84 77 99    BP (!) 128/52   Pulse 84   Ht 5' 7"$  (1.702 m)   Wt 157 lb (71.2 kg)   BMI 24.59 kg/m   Wt Readings from Last 3 Encounters:  08/07/22 157 lb (71.2 kg)  08/05/22 156 lb 3.2 oz (70.9 kg)  07/04/22 142 lb 6.7 oz (64.6 kg)     CMP ( most recent) CMP     Component Value Date/Time   NA 135 07/04/2022 0703   K 6.4 (HH) 07/04/2022 0703   CL 96 (L) 07/04/2022 0703   CO2 28 07/04/2022 0703   GLUCOSE 165 (H) 07/04/2022 0703   BUN 27 (H) 07/04/2022 0703   CREATININE 7.06 (H) 07/04/2022 0703   CALCIUM 8.1 (L) 07/04/2022 0703   PROT 6.4 (L) 06/10/2022 0353   ALBUMIN 2.3 (L) 06/30/2022 0710   AST 17 06/10/2022 0353   ALT 13 06/10/2022 0353   ALKPHOS 58 06/10/2022 0353   BILITOT 0.7 06/10/2022 0353   GFRNONAA 5 (L) 07/04/2022 0703   GFRAA 5 (L) 01/16/2020 0650     Diabetic Labs (most recent): Lab Results  Component Value Date   HGBA1C 8.8 (H) 06/02/2022   HGBA1C 8.1 (A) 02/03/2022   HGBA1C 8.7 (A) 07/25/2021   MICROALBUR 112.00 03/20/2006     Lipid Panel ( most recent) Lipid Panel     Component Value Date/Time   CHOL 128 04/03/2022 1115   TRIG 76 04/03/2022 1115   HDL 50 04/03/2022 1115   CHOLHDL 2.6 04/03/2022 1115   CHOLHDL 4.0 07/21/2013 0720   VLDL 19 07/21/2013 0720   LDLCALC 63 04/03/2022 1115   LABVLDL 15 04/03/2022 1115      Lab Results  Component Value Date   TSH 3.600 04/03/2022   TSH 2.240 11/13/2021   TSH 1.342 08/19/2013   TSH 1.480 10/16/2006   TSH 1.821 02/26/2006   FREET4 1.15 04/03/2022   FREET4 1.22 11/13/2021      Assessment & Plan:   1. 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 Hyperlipidemia associated with type 2 diabetes mellitus ([place]) 3. Hypertensive heart and CKD, ESRD on dialysis  [facility])   - LUCCA CRUZAN has currently uncontrolled symptomatic type 2 DM  since  82 years of age.  She presents with a CGM device showing average blood glucose of 237, 26% time in range, 31% level 1 hyperglycemia, 41% level 2 hyperglycemia, no hypoglycemia documented or reported.  Her recent A1c was 8.8% increasing from 8.1%.  - I had a long discussion with her about the progressive nature of diabetes and the pathology behind its complications. -her diabetes is complicated by ESRD on hemodialysis, obesity/sedentary life and she remains at a high risk for more acute and chronic complications which include CAD, CVA, CKD, retinopathy, and neuropathy. These are all discussed in detail with her.  - I discussed all available options of managing her diabetes including de-escalation of medications. I have counseled her on diet  and weight management  by adopting a Whole Food , Plant Predominant  ( WFPP) nutrition as recommended by [employer] of Lifestyle Medicine. Patient is encouraged to switch to  unprocessed or minimally processed  complex starch, adequate protein intake (mainly plant source), minimal liquid fat ( mainly vegetable oils), plenty of fruits, and vegetables. -  she is advised to stick to a routine mealtimes to eat 3 complete meals a day and snack only when necessary ( to snack only to correct hypoglycemia BG <70 day time or <100 at night).    - she acknowledges that there is a room for improvement in her food and drink choices. - Further Specific Suggestion is made for her to avoid simple carbohydrates  from her diet including Cakes, Sweet Desserts, Ice Cream, Soda (diet and regular), Sweet Tea, Candies, Chips, Cookies, Store Bought Juices, Alcohol in Excess of  1-2 drinks a day, Artificial Sweeteners,  Coffee Creamer, and "Sugar-free" Products. This will help patient to have more stable blood glucose profile and potentially avoid unintended weight gain.  - she will be scheduled  with Jearld Fenton, RDN, CDE for individualized diabetes education.  - I have approached her with the following plan to manage  her diabetes and patient agrees:   In light of her presentation with uncontrolled glycemic profile, she will need a higher dose of her premixed insulin.  She is advised to increase her NovoLog 70/30 to 20 units daily with breakfast and 10 units daily with supper only if blood glucose readings above 90 mg per DL and only if she is eating.    She is encouraged to use her CGM continuously.    -She is encouraged to use her CGM to monitor blood glucose continuously.    - Adjustment parameters are given to her for hypo and hyperglycemia in writing. - she is encouraged to call clinic for blood glucose levels less than 70 or above 200 mg /dl.   - Specific targets for  A1c;  LDL, HDL,  and Triglycerides were discussed with the patient.  2) Blood Pressure /Hypertension:  Her blood pressure is controlled to target.  she is advised to continue her current medications including labetalol 100 mg p.o. twice daily.  Blood pressure and management is deferred to nephrology .  3) Lipids/Hyperlipidemia:   Review of her recent lipid panel showed  controlled  LDL at 68 .  She is not on any antilipid treatment.   She will be considered for fasting lipid panel on subsequent visits.   4)  Weight/Diet:  Body mass index is 24.59 kg/m.  -   she is not a candidate for weight loss.  The above detailed WFPP Nutrition will help manage weight as well. Optimal Exercise, Restorative Sleep  information was detailed on discharge instructions.  5) Chronic Care/Health Maintenance:  -she   is encouraged to initiate and continue to follow up with Ophthalmology, Dentist,  Podiatrist at least yearly or according to recommendations, and advised to   stay away from smoking. I have recommended yearly flu vaccine and pneumonia vaccine at least every 5 years; moderate intensity exercise for up to 150 minutes  weekly; and  sleep for 7- 9 hours a day.  - she is  advised to maintain close follow up with [name], MD for primary care needs, as well as her other providers for optimal and coordinated care.    I spent 27  minutes in the care of the patient today including review of labs from [place], Lipids, Thyroid Function, Hematology (current and previous including abstractions from other facilities); face-to-face time discussing  her blood glucose readings/logs, discussing hypoglycemia and hyperglycemia episodes and symptoms, medications doses, her options of short and long term treatment based on the latest standards of care / guidelines;  discussion about incorporating lifestyle medicine;  and documenting the encounter. Risk reduction counseling performed per USPSTF guidelines to reduce  cardiovascular risk factors.     Please refer to Patient Instructions for Blood Glucose Monitoring and Insulin/Medications Dosing Guide"  in media tab for additional information. Please  also refer to " Patient Self Inventory" in the Media  tab for reviewed elements of pertinent patient history.  [name] participated in the discussions, expressed understanding, and voiced agreement with the above plans.  All questions were answered to her satisfaction. she is encouraged to contact clinic should she have any questions or concerns prior to her return visit.    Follow up plan: - Return in about 3 months (around 11/05/2022) for Bring Meter/CGM Device/Logs- A1c in Office.  Glade Lloyd, MD [facility] [address] [place], [place] [address] Phone: [phone]  Fax: [phone]    08/07/2022, 12:28 PM  This note was partially dictated with voice recognition software. Similar sounding words can be transcribed inadequately or may not  be corrected upon review.

## 2022-08-08 DIAGNOSIS — N186 End stage renal disease: Secondary | ICD-10-CM | POA: Diagnosis not present

## 2022-08-08 DIAGNOSIS — Z992 Dependence on renal dialysis: Secondary | ICD-10-CM | POA: Diagnosis not present

## 2022-08-11 DIAGNOSIS — N186 End stage renal disease: Secondary | ICD-10-CM | POA: Diagnosis not present

## 2022-08-11 DIAGNOSIS — Z992 Dependence on renal dialysis: Secondary | ICD-10-CM | POA: Diagnosis not present

## 2022-08-13 DIAGNOSIS — N186 End stage renal disease: Secondary | ICD-10-CM | POA: Diagnosis not present

## 2022-08-13 DIAGNOSIS — Z992 Dependence on renal dialysis: Secondary | ICD-10-CM | POA: Diagnosis not present

## 2022-08-14 DIAGNOSIS — M25561 Pain in right knee: Secondary | ICD-10-CM | POA: Diagnosis not present

## 2022-08-14 DIAGNOSIS — J90 Pleural effusion, not elsewhere classified: Secondary | ICD-10-CM | POA: Diagnosis not present

## 2022-08-14 DIAGNOSIS — N186 End stage renal disease: Secondary | ICD-10-CM | POA: Diagnosis not present

## 2022-08-14 DIAGNOSIS — D696 Thrombocytopenia, unspecified: Secondary | ICD-10-CM | POA: Diagnosis not present

## 2022-08-14 DIAGNOSIS — E1122 Type 2 diabetes mellitus with diabetic chronic kidney disease: Secondary | ICD-10-CM | POA: Diagnosis not present

## 2022-08-14 DIAGNOSIS — I120 Hypertensive chronic kidney disease with stage 5 chronic kidney disease or end stage renal disease: Secondary | ICD-10-CM | POA: Diagnosis not present

## 2022-08-14 DIAGNOSIS — Z7982 Long term (current) use of aspirin: Secondary | ICD-10-CM | POA: Diagnosis not present

## 2022-08-14 DIAGNOSIS — Z9181 History of falling: Secondary | ICD-10-CM | POA: Diagnosis not present

## 2022-08-14 DIAGNOSIS — M6281 Muscle weakness (generalized): Secondary | ICD-10-CM | POA: Diagnosis not present

## 2022-08-14 DIAGNOSIS — K219 Gastro-esophageal reflux disease without esophagitis: Secondary | ICD-10-CM | POA: Diagnosis not present

## 2022-08-14 DIAGNOSIS — M549 Dorsalgia, unspecified: Secondary | ICD-10-CM | POA: Diagnosis not present

## 2022-08-14 DIAGNOSIS — Z992 Dependence on renal dialysis: Secondary | ICD-10-CM | POA: Diagnosis not present

## 2022-08-14 DIAGNOSIS — E519 Thiamine deficiency, unspecified: Secondary | ICD-10-CM | POA: Diagnosis not present

## 2022-08-14 DIAGNOSIS — M25562 Pain in left knee: Secondary | ICD-10-CM | POA: Diagnosis not present

## 2022-08-15 DIAGNOSIS — M549 Dorsalgia, unspecified: Secondary | ICD-10-CM | POA: Diagnosis not present

## 2022-08-15 DIAGNOSIS — M6281 Muscle weakness (generalized): Secondary | ICD-10-CM | POA: Diagnosis not present

## 2022-08-15 DIAGNOSIS — E1122 Type 2 diabetes mellitus with diabetic chronic kidney disease: Secondary | ICD-10-CM | POA: Diagnosis not present

## 2022-08-15 DIAGNOSIS — Z992 Dependence on renal dialysis: Secondary | ICD-10-CM | POA: Diagnosis not present

## 2022-08-15 DIAGNOSIS — K219 Gastro-esophageal reflux disease without esophagitis: Secondary | ICD-10-CM | POA: Diagnosis not present

## 2022-08-15 DIAGNOSIS — M25561 Pain in right knee: Secondary | ICD-10-CM | POA: Diagnosis not present

## 2022-08-15 DIAGNOSIS — M25562 Pain in left knee: Secondary | ICD-10-CM | POA: Diagnosis not present

## 2022-08-15 DIAGNOSIS — Z7982 Long term (current) use of aspirin: Secondary | ICD-10-CM | POA: Diagnosis not present

## 2022-08-15 DIAGNOSIS — J90 Pleural effusion, not elsewhere classified: Secondary | ICD-10-CM | POA: Diagnosis not present

## 2022-08-15 DIAGNOSIS — I120 Hypertensive chronic kidney disease with stage 5 chronic kidney disease or end stage renal disease: Secondary | ICD-10-CM | POA: Diagnosis not present

## 2022-08-15 DIAGNOSIS — D696 Thrombocytopenia, unspecified: Secondary | ICD-10-CM | POA: Diagnosis not present

## 2022-08-15 DIAGNOSIS — N186 End stage renal disease: Secondary | ICD-10-CM | POA: Diagnosis not present

## 2022-08-15 DIAGNOSIS — Z9181 History of falling: Secondary | ICD-10-CM | POA: Diagnosis not present

## 2022-08-18 DIAGNOSIS — N186 End stage renal disease: Secondary | ICD-10-CM | POA: Diagnosis not present

## 2022-08-18 DIAGNOSIS — Z992 Dependence on renal dialysis: Secondary | ICD-10-CM | POA: Diagnosis not present

## 2022-08-19 ENCOUNTER — Telehealth (INDEPENDENT_AMBULATORY_CARE_PROVIDER_SITE_OTHER): Payer: Self-pay

## 2022-08-19 NOTE — Telephone Encounter (Signed)
Daughter Lorna Few called back and asked to be called back on [phone]. I called back and no answer. Will try again later today.

## 2022-08-19 NOTE — Telephone Encounter (Signed)
Stool Cards x three done incorrectly, I have called the patient and left a vm asking to return call to the office. She will need to recollect.

## 2022-08-19 NOTE — Telephone Encounter (Signed)
Pt's daughter aware and will pick up new cards to redo. Cards left at front for pick up.

## 2022-08-20 DIAGNOSIS — M549 Dorsalgia, unspecified: Secondary | ICD-10-CM | POA: Diagnosis not present

## 2022-08-20 DIAGNOSIS — Z794 Long term (current) use of insulin: Secondary | ICD-10-CM | POA: Diagnosis not present

## 2022-08-20 DIAGNOSIS — I120 Hypertensive chronic kidney disease with stage 5 chronic kidney disease or end stage renal disease: Secondary | ICD-10-CM | POA: Diagnosis not present

## 2022-08-20 DIAGNOSIS — J90 Pleural effusion, not elsewhere classified: Secondary | ICD-10-CM | POA: Diagnosis not present

## 2022-08-20 DIAGNOSIS — D696 Thrombocytopenia, unspecified: Secondary | ICD-10-CM | POA: Diagnosis not present

## 2022-08-20 DIAGNOSIS — Z992 Dependence on renal dialysis: Secondary | ICD-10-CM | POA: Diagnosis not present

## 2022-08-20 DIAGNOSIS — E1122 Type 2 diabetes mellitus with diabetic chronic kidney disease: Secondary | ICD-10-CM | POA: Diagnosis not present

## 2022-08-20 DIAGNOSIS — Z9181 History of falling: Secondary | ICD-10-CM | POA: Diagnosis not present

## 2022-08-20 DIAGNOSIS — N186 End stage renal disease: Secondary | ICD-10-CM | POA: Diagnosis not present

## 2022-08-20 DIAGNOSIS — M25561 Pain in right knee: Secondary | ICD-10-CM | POA: Diagnosis not present

## 2022-08-20 DIAGNOSIS — M25562 Pain in left knee: Secondary | ICD-10-CM | POA: Diagnosis not present

## 2022-08-20 DIAGNOSIS — Z7982 Long term (current) use of aspirin: Secondary | ICD-10-CM | POA: Diagnosis not present

## 2022-08-20 DIAGNOSIS — K219 Gastro-esophageal reflux disease without esophagitis: Secondary | ICD-10-CM | POA: Diagnosis not present

## 2022-08-20 DIAGNOSIS — M6281 Muscle weakness (generalized): Secondary | ICD-10-CM | POA: Diagnosis not present

## 2022-08-21 DIAGNOSIS — D696 Thrombocytopenia, unspecified: Secondary | ICD-10-CM | POA: Diagnosis not present

## 2022-08-21 DIAGNOSIS — M25562 Pain in left knee: Secondary | ICD-10-CM | POA: Diagnosis not present

## 2022-08-21 DIAGNOSIS — M25561 Pain in right knee: Secondary | ICD-10-CM | POA: Diagnosis not present

## 2022-08-21 DIAGNOSIS — N186 End stage renal disease: Secondary | ICD-10-CM | POA: Diagnosis not present

## 2022-08-21 DIAGNOSIS — I120 Hypertensive chronic kidney disease with stage 5 chronic kidney disease or end stage renal disease: Secondary | ICD-10-CM | POA: Diagnosis not present

## 2022-08-21 DIAGNOSIS — M6281 Muscle weakness (generalized): Secondary | ICD-10-CM | POA: Diagnosis not present

## 2022-08-21 DIAGNOSIS — Z992 Dependence on renal dialysis: Secondary | ICD-10-CM | POA: Diagnosis not present

## 2022-08-21 DIAGNOSIS — Z9181 History of falling: Secondary | ICD-10-CM | POA: Diagnosis not present

## 2022-08-21 DIAGNOSIS — M549 Dorsalgia, unspecified: Secondary | ICD-10-CM | POA: Diagnosis not present

## 2022-08-21 DIAGNOSIS — K219 Gastro-esophageal reflux disease without esophagitis: Secondary | ICD-10-CM | POA: Diagnosis not present

## 2022-08-21 DIAGNOSIS — J90 Pleural effusion, not elsewhere classified: Secondary | ICD-10-CM | POA: Diagnosis not present

## 2022-08-21 DIAGNOSIS — Z7982 Long term (current) use of aspirin: Secondary | ICD-10-CM | POA: Diagnosis not present

## 2022-08-21 DIAGNOSIS — E1122 Type 2 diabetes mellitus with diabetic chronic kidney disease: Secondary | ICD-10-CM | POA: Diagnosis not present

## 2022-08-22 DIAGNOSIS — Z992 Dependence on renal dialysis: Secondary | ICD-10-CM | POA: Diagnosis not present

## 2022-08-22 DIAGNOSIS — N186 End stage renal disease: Secondary | ICD-10-CM | POA: Diagnosis not present

## 2022-08-25 DIAGNOSIS — Z992 Dependence on renal dialysis: Secondary | ICD-10-CM | POA: Diagnosis not present

## 2022-08-25 DIAGNOSIS — N186 End stage renal disease: Secondary | ICD-10-CM | POA: Diagnosis not present

## 2022-08-26 DIAGNOSIS — I10 Essential (primary) hypertension: Secondary | ICD-10-CM | POA: Diagnosis not present

## 2022-08-26 DIAGNOSIS — M25562 Pain in left knee: Secondary | ICD-10-CM | POA: Diagnosis not present

## 2022-08-26 DIAGNOSIS — K219 Gastro-esophageal reflux disease without esophagitis: Secondary | ICD-10-CM | POA: Diagnosis not present

## 2022-08-26 DIAGNOSIS — M79672 Pain in left foot: Secondary | ICD-10-CM | POA: Diagnosis not present

## 2022-08-26 DIAGNOSIS — N186 End stage renal disease: Secondary | ICD-10-CM | POA: Diagnosis not present

## 2022-08-26 DIAGNOSIS — D696 Thrombocytopenia, unspecified: Secondary | ICD-10-CM | POA: Diagnosis not present

## 2022-08-26 DIAGNOSIS — J90 Pleural effusion, not elsewhere classified: Secondary | ICD-10-CM | POA: Diagnosis not present

## 2022-08-26 DIAGNOSIS — M6281 Muscle weakness (generalized): Secondary | ICD-10-CM | POA: Diagnosis not present

## 2022-08-26 DIAGNOSIS — M549 Dorsalgia, unspecified: Secondary | ICD-10-CM | POA: Diagnosis not present

## 2022-08-26 DIAGNOSIS — Z9181 History of falling: Secondary | ICD-10-CM | POA: Diagnosis not present

## 2022-08-26 DIAGNOSIS — E1122 Type 2 diabetes mellitus with diabetic chronic kidney disease: Secondary | ICD-10-CM | POA: Diagnosis not present

## 2022-08-26 DIAGNOSIS — Z7982 Long term (current) use of aspirin: Secondary | ICD-10-CM | POA: Diagnosis not present

## 2022-08-26 DIAGNOSIS — M25561 Pain in right knee: Secondary | ICD-10-CM | POA: Diagnosis not present

## 2022-08-26 DIAGNOSIS — I120 Hypertensive chronic kidney disease with stage 5 chronic kidney disease or end stage renal disease: Secondary | ICD-10-CM | POA: Diagnosis not present

## 2022-08-26 DIAGNOSIS — Z992 Dependence on renal dialysis: Secondary | ICD-10-CM | POA: Diagnosis not present

## 2022-08-26 DIAGNOSIS — D510 Vitamin B12 deficiency anemia due to intrinsic factor deficiency: Secondary | ICD-10-CM | POA: Diagnosis not present

## 2022-08-27 DIAGNOSIS — Z992 Dependence on renal dialysis: Secondary | ICD-10-CM | POA: Diagnosis not present

## 2022-08-27 DIAGNOSIS — N186 End stage renal disease: Secondary | ICD-10-CM | POA: Diagnosis not present

## 2022-08-28 ENCOUNTER — Ambulatory Visit: Payer: Medicare Other | Admitting: Orthopedic Surgery

## 2022-08-28 DIAGNOSIS — Z992 Dependence on renal dialysis: Secondary | ICD-10-CM | POA: Diagnosis not present

## 2022-08-28 DIAGNOSIS — I120 Hypertensive chronic kidney disease with stage 5 chronic kidney disease or end stage renal disease: Secondary | ICD-10-CM | POA: Diagnosis not present

## 2022-08-28 DIAGNOSIS — K219 Gastro-esophageal reflux disease without esophagitis: Secondary | ICD-10-CM | POA: Diagnosis not present

## 2022-08-28 DIAGNOSIS — M6281 Muscle weakness (generalized): Secondary | ICD-10-CM | POA: Diagnosis not present

## 2022-08-28 DIAGNOSIS — J90 Pleural effusion, not elsewhere classified: Secondary | ICD-10-CM | POA: Diagnosis not present

## 2022-08-28 DIAGNOSIS — M25561 Pain in right knee: Secondary | ICD-10-CM | POA: Diagnosis not present

## 2022-08-28 DIAGNOSIS — E1122 Type 2 diabetes mellitus with diabetic chronic kidney disease: Secondary | ICD-10-CM | POA: Diagnosis not present

## 2022-08-28 DIAGNOSIS — M549 Dorsalgia, unspecified: Secondary | ICD-10-CM | POA: Diagnosis not present

## 2022-08-28 DIAGNOSIS — N186 End stage renal disease: Secondary | ICD-10-CM | POA: Diagnosis not present

## 2022-08-28 DIAGNOSIS — M25562 Pain in left knee: Secondary | ICD-10-CM | POA: Diagnosis not present

## 2022-08-28 DIAGNOSIS — Z7982 Long term (current) use of aspirin: Secondary | ICD-10-CM | POA: Diagnosis not present

## 2022-08-28 DIAGNOSIS — D696 Thrombocytopenia, unspecified: Secondary | ICD-10-CM | POA: Diagnosis not present

## 2022-08-28 DIAGNOSIS — Z9181 History of falling: Secondary | ICD-10-CM | POA: Diagnosis not present

## 2022-08-29 DIAGNOSIS — N186 End stage renal disease: Secondary | ICD-10-CM | POA: Diagnosis not present

## 2022-08-29 DIAGNOSIS — Z992 Dependence on renal dialysis: Secondary | ICD-10-CM | POA: Diagnosis not present

## 2022-09-01 DIAGNOSIS — Z992 Dependence on renal dialysis: Secondary | ICD-10-CM | POA: Diagnosis not present

## 2022-09-01 DIAGNOSIS — N186 End stage renal disease: Secondary | ICD-10-CM | POA: Diagnosis not present

## 2022-09-02 ENCOUNTER — Other Ambulatory Visit (INDEPENDENT_AMBULATORY_CARE_PROVIDER_SITE_OTHER): Payer: Self-pay

## 2022-09-02 DIAGNOSIS — D631 Anemia in chronic kidney disease: Secondary | ICD-10-CM

## 2022-09-02 DIAGNOSIS — J90 Pleural effusion, not elsewhere classified: Secondary | ICD-10-CM | POA: Diagnosis not present

## 2022-09-02 DIAGNOSIS — M25562 Pain in left knee: Secondary | ICD-10-CM | POA: Diagnosis not present

## 2022-09-02 DIAGNOSIS — Z7982 Long term (current) use of aspirin: Secondary | ICD-10-CM | POA: Diagnosis not present

## 2022-09-02 DIAGNOSIS — Z9181 History of falling: Secondary | ICD-10-CM | POA: Diagnosis not present

## 2022-09-02 DIAGNOSIS — D62 Acute posthemorrhagic anemia: Secondary | ICD-10-CM

## 2022-09-02 DIAGNOSIS — D696 Thrombocytopenia, unspecified: Secondary | ICD-10-CM | POA: Diagnosis not present

## 2022-09-02 DIAGNOSIS — I120 Hypertensive chronic kidney disease with stage 5 chronic kidney disease or end stage renal disease: Secondary | ICD-10-CM | POA: Diagnosis not present

## 2022-09-02 DIAGNOSIS — N186 End stage renal disease: Secondary | ICD-10-CM | POA: Diagnosis not present

## 2022-09-02 DIAGNOSIS — M6281 Muscle weakness (generalized): Secondary | ICD-10-CM | POA: Diagnosis not present

## 2022-09-02 DIAGNOSIS — E1122 Type 2 diabetes mellitus with diabetic chronic kidney disease: Secondary | ICD-10-CM | POA: Diagnosis not present

## 2022-09-02 DIAGNOSIS — K219 Gastro-esophageal reflux disease without esophagitis: Secondary | ICD-10-CM | POA: Diagnosis not present

## 2022-09-02 DIAGNOSIS — M25561 Pain in right knee: Secondary | ICD-10-CM | POA: Diagnosis not present

## 2022-09-02 DIAGNOSIS — Z992 Dependence on renal dialysis: Secondary | ICD-10-CM | POA: Diagnosis not present

## 2022-09-02 DIAGNOSIS — M549 Dorsalgia, unspecified: Secondary | ICD-10-CM | POA: Diagnosis not present

## 2022-09-02 LAB — POC HEMOCCULT BLD/STL (HOME/3-CARD/SCREEN)
Card #2 Fecal Occult Blod, POC: NEGATIVE
Card #3 Fecal Occult Blood, POC: NEGATIVE
Fecal Occult Blood, POC: NEGATIVE

## 2022-09-03 DIAGNOSIS — Z992 Dependence on renal dialysis: Secondary | ICD-10-CM | POA: Diagnosis not present

## 2022-09-03 DIAGNOSIS — N186 End stage renal disease: Secondary | ICD-10-CM | POA: Diagnosis not present

## 2022-09-05 DIAGNOSIS — N186 End stage renal disease: Secondary | ICD-10-CM | POA: Diagnosis not present

## 2022-09-05 DIAGNOSIS — Z992 Dependence on renal dialysis: Secondary | ICD-10-CM | POA: Diagnosis not present

## 2022-09-08 DIAGNOSIS — N186 End stage renal disease: Secondary | ICD-10-CM | POA: Diagnosis not present

## 2022-09-08 DIAGNOSIS — Z992 Dependence on renal dialysis: Secondary | ICD-10-CM | POA: Diagnosis not present

## 2022-09-10 DIAGNOSIS — N186 End stage renal disease: Secondary | ICD-10-CM | POA: Diagnosis not present

## 2022-09-10 DIAGNOSIS — Z992 Dependence on renal dialysis: Secondary | ICD-10-CM | POA: Diagnosis not present

## 2022-09-11 ENCOUNTER — Other Ambulatory Visit (INDEPENDENT_AMBULATORY_CARE_PROVIDER_SITE_OTHER): Payer: Self-pay | Admitting: *Deleted

## 2022-09-11 DIAGNOSIS — M25562 Pain in left knee: Secondary | ICD-10-CM | POA: Diagnosis not present

## 2022-09-11 DIAGNOSIS — I120 Hypertensive chronic kidney disease with stage 5 chronic kidney disease or end stage renal disease: Secondary | ICD-10-CM | POA: Diagnosis not present

## 2022-09-11 DIAGNOSIS — M25561 Pain in right knee: Secondary | ICD-10-CM | POA: Diagnosis not present

## 2022-09-11 DIAGNOSIS — J90 Pleural effusion, not elsewhere classified: Secondary | ICD-10-CM | POA: Diagnosis not present

## 2022-09-11 DIAGNOSIS — Z992 Dependence on renal dialysis: Secondary | ICD-10-CM | POA: Diagnosis not present

## 2022-09-11 DIAGNOSIS — D696 Thrombocytopenia, unspecified: Secondary | ICD-10-CM | POA: Diagnosis not present

## 2022-09-11 DIAGNOSIS — M6281 Muscle weakness (generalized): Secondary | ICD-10-CM | POA: Diagnosis not present

## 2022-09-11 DIAGNOSIS — E1122 Type 2 diabetes mellitus with diabetic chronic kidney disease: Secondary | ICD-10-CM | POA: Diagnosis not present

## 2022-09-11 DIAGNOSIS — Z7982 Long term (current) use of aspirin: Secondary | ICD-10-CM | POA: Diagnosis not present

## 2022-09-11 DIAGNOSIS — R7989 Other specified abnormal findings of blood chemistry: Secondary | ICD-10-CM

## 2022-09-11 DIAGNOSIS — Z9181 History of falling: Secondary | ICD-10-CM | POA: Diagnosis not present

## 2022-09-11 DIAGNOSIS — M549 Dorsalgia, unspecified: Secondary | ICD-10-CM | POA: Diagnosis not present

## 2022-09-11 DIAGNOSIS — N186 End stage renal disease: Secondary | ICD-10-CM | POA: Diagnosis not present

## 2022-09-11 DIAGNOSIS — K219 Gastro-esophageal reflux disease without esophagitis: Secondary | ICD-10-CM | POA: Diagnosis not present

## 2022-09-12 DIAGNOSIS — Z992 Dependence on renal dialysis: Secondary | ICD-10-CM | POA: Diagnosis not present

## 2022-09-12 DIAGNOSIS — N186 End stage renal disease: Secondary | ICD-10-CM | POA: Diagnosis not present

## 2022-09-14 DIAGNOSIS — Z992 Dependence on renal dialysis: Secondary | ICD-10-CM | POA: Diagnosis not present

## 2022-09-14 DIAGNOSIS — N186 End stage renal disease: Secondary | ICD-10-CM | POA: Diagnosis not present

## 2022-09-15 DIAGNOSIS — Z23 Encounter for immunization: Secondary | ICD-10-CM | POA: Diagnosis not present

## 2022-09-15 DIAGNOSIS — Z992 Dependence on renal dialysis: Secondary | ICD-10-CM | POA: Diagnosis not present

## 2022-09-15 DIAGNOSIS — N186 End stage renal disease: Secondary | ICD-10-CM | POA: Diagnosis not present

## 2022-09-16 ENCOUNTER — Encounter (INDEPENDENT_AMBULATORY_CARE_PROVIDER_SITE_OTHER): Payer: Self-pay | Admitting: Gastroenterology

## 2022-09-16 DIAGNOSIS — J90 Pleural effusion, not elsewhere classified: Secondary | ICD-10-CM | POA: Diagnosis not present

## 2022-09-16 DIAGNOSIS — I120 Hypertensive chronic kidney disease with stage 5 chronic kidney disease or end stage renal disease: Secondary | ICD-10-CM | POA: Diagnosis not present

## 2022-09-16 DIAGNOSIS — Z9181 History of falling: Secondary | ICD-10-CM | POA: Diagnosis not present

## 2022-09-16 DIAGNOSIS — M6281 Muscle weakness (generalized): Secondary | ICD-10-CM | POA: Diagnosis not present

## 2022-09-16 DIAGNOSIS — M25562 Pain in left knee: Secondary | ICD-10-CM | POA: Diagnosis not present

## 2022-09-16 DIAGNOSIS — M25561 Pain in right knee: Secondary | ICD-10-CM | POA: Diagnosis not present

## 2022-09-16 DIAGNOSIS — Z992 Dependence on renal dialysis: Secondary | ICD-10-CM | POA: Diagnosis not present

## 2022-09-16 DIAGNOSIS — D696 Thrombocytopenia, unspecified: Secondary | ICD-10-CM | POA: Diagnosis not present

## 2022-09-16 DIAGNOSIS — Z7982 Long term (current) use of aspirin: Secondary | ICD-10-CM | POA: Diagnosis not present

## 2022-09-16 DIAGNOSIS — M549 Dorsalgia, unspecified: Secondary | ICD-10-CM | POA: Diagnosis not present

## 2022-09-16 DIAGNOSIS — K219 Gastro-esophageal reflux disease without esophagitis: Secondary | ICD-10-CM | POA: Diagnosis not present

## 2022-09-16 DIAGNOSIS — E1122 Type 2 diabetes mellitus with diabetic chronic kidney disease: Secondary | ICD-10-CM | POA: Diagnosis not present

## 2022-09-16 DIAGNOSIS — N186 End stage renal disease: Secondary | ICD-10-CM | POA: Diagnosis not present

## 2022-09-17 DIAGNOSIS — Z23 Encounter for immunization: Secondary | ICD-10-CM | POA: Diagnosis not present

## 2022-09-17 DIAGNOSIS — N186 End stage renal disease: Secondary | ICD-10-CM | POA: Diagnosis not present

## 2022-09-17 DIAGNOSIS — Z992 Dependence on renal dialysis: Secondary | ICD-10-CM | POA: Diagnosis not present

## 2022-09-19 DIAGNOSIS — E1122 Type 2 diabetes mellitus with diabetic chronic kidney disease: Secondary | ICD-10-CM | POA: Diagnosis not present

## 2022-09-19 DIAGNOSIS — Z23 Encounter for immunization: Secondary | ICD-10-CM | POA: Diagnosis not present

## 2022-09-19 DIAGNOSIS — N186 End stage renal disease: Secondary | ICD-10-CM | POA: Diagnosis not present

## 2022-09-19 DIAGNOSIS — Z794 Long term (current) use of insulin: Secondary | ICD-10-CM | POA: Diagnosis not present

## 2022-09-19 DIAGNOSIS — Z992 Dependence on renal dialysis: Secondary | ICD-10-CM | POA: Diagnosis not present

## 2022-09-22 DIAGNOSIS — E119 Type 2 diabetes mellitus without complications: Secondary | ICD-10-CM | POA: Diagnosis not present

## 2022-09-22 DIAGNOSIS — N186 End stage renal disease: Secondary | ICD-10-CM | POA: Diagnosis not present

## 2022-09-22 DIAGNOSIS — Z23 Encounter for immunization: Secondary | ICD-10-CM | POA: Diagnosis not present

## 2022-09-22 DIAGNOSIS — Z992 Dependence on renal dialysis: Secondary | ICD-10-CM | POA: Diagnosis not present

## 2022-09-24 DIAGNOSIS — Z992 Dependence on renal dialysis: Secondary | ICD-10-CM | POA: Diagnosis not present

## 2022-09-24 DIAGNOSIS — N186 End stage renal disease: Secondary | ICD-10-CM | POA: Diagnosis not present

## 2022-09-24 DIAGNOSIS — Z23 Encounter for immunization: Secondary | ICD-10-CM | POA: Diagnosis not present

## 2022-09-26 DIAGNOSIS — Z23 Encounter for immunization: Secondary | ICD-10-CM | POA: Diagnosis not present

## 2022-09-26 DIAGNOSIS — Z992 Dependence on renal dialysis: Secondary | ICD-10-CM | POA: Diagnosis not present

## 2022-09-26 DIAGNOSIS — N186 End stage renal disease: Secondary | ICD-10-CM | POA: Diagnosis not present

## 2022-09-29 ENCOUNTER — Telehealth: Payer: Self-pay

## 2022-09-29 DIAGNOSIS — I10 Essential (primary) hypertension: Secondary | ICD-10-CM

## 2022-09-29 DIAGNOSIS — Z23 Encounter for immunization: Secondary | ICD-10-CM | POA: Diagnosis not present

## 2022-09-29 DIAGNOSIS — E1122 Type 2 diabetes mellitus with diabetic chronic kidney disease: Secondary | ICD-10-CM

## 2022-09-29 DIAGNOSIS — Z992 Dependence on renal dialysis: Secondary | ICD-10-CM | POA: Diagnosis not present

## 2022-09-29 DIAGNOSIS — N186 End stage renal disease: Secondary | ICD-10-CM | POA: Diagnosis not present

## 2022-09-29 NOTE — Patient Outreach (Signed)
Received a referral for a Child psychotherapist.   I have sent this referral to the [facility] Care Guides to assign a Social Worker to follow up.    Iverson Alamin, Donivan Scull [facility] Care Management Assistant [facility] Care Management [phone]

## 2022-10-01 DIAGNOSIS — Z23 Encounter for immunization: Secondary | ICD-10-CM | POA: Diagnosis not present

## 2022-10-01 DIAGNOSIS — N186 End stage renal disease: Secondary | ICD-10-CM | POA: Diagnosis not present

## 2022-10-01 DIAGNOSIS — Z992 Dependence on renal dialysis: Secondary | ICD-10-CM | POA: Diagnosis not present

## 2022-10-03 DIAGNOSIS — Z23 Encounter for immunization: Secondary | ICD-10-CM | POA: Diagnosis not present

## 2022-10-03 DIAGNOSIS — N186 End stage renal disease: Secondary | ICD-10-CM | POA: Diagnosis not present

## 2022-10-03 DIAGNOSIS — Z992 Dependence on renal dialysis: Secondary | ICD-10-CM | POA: Diagnosis not present

## 2022-10-06 DIAGNOSIS — Z992 Dependence on renal dialysis: Secondary | ICD-10-CM | POA: Diagnosis not present

## 2022-10-06 DIAGNOSIS — Z23 Encounter for immunization: Secondary | ICD-10-CM | POA: Diagnosis not present

## 2022-10-06 DIAGNOSIS — N186 End stage renal disease: Secondary | ICD-10-CM | POA: Diagnosis not present

## 2022-10-08 DIAGNOSIS — Z992 Dependence on renal dialysis: Secondary | ICD-10-CM | POA: Diagnosis not present

## 2022-10-08 DIAGNOSIS — Z23 Encounter for immunization: Secondary | ICD-10-CM | POA: Diagnosis not present

## 2022-10-08 DIAGNOSIS — N186 End stage renal disease: Secondary | ICD-10-CM | POA: Diagnosis not present

## 2022-10-10 DIAGNOSIS — Z23 Encounter for immunization: Secondary | ICD-10-CM | POA: Diagnosis not present

## 2022-10-10 DIAGNOSIS — Z992 Dependence on renal dialysis: Secondary | ICD-10-CM | POA: Diagnosis not present

## 2022-10-10 DIAGNOSIS — N186 End stage renal disease: Secondary | ICD-10-CM | POA: Diagnosis not present

## 2022-10-13 DIAGNOSIS — Z992 Dependence on renal dialysis: Secondary | ICD-10-CM | POA: Diagnosis not present

## 2022-10-13 DIAGNOSIS — Z23 Encounter for immunization: Secondary | ICD-10-CM | POA: Diagnosis not present

## 2022-10-13 DIAGNOSIS — N186 End stage renal disease: Secondary | ICD-10-CM | POA: Diagnosis not present

## 2022-10-14 DIAGNOSIS — E1165 Type 2 diabetes mellitus with hyperglycemia: Secondary | ICD-10-CM | POA: Diagnosis not present

## 2022-10-14 DIAGNOSIS — I10 Essential (primary) hypertension: Secondary | ICD-10-CM | POA: Diagnosis not present

## 2022-10-14 DIAGNOSIS — Z992 Dependence on renal dialysis: Secondary | ICD-10-CM | POA: Diagnosis not present

## 2022-10-14 DIAGNOSIS — N186 End stage renal disease: Secondary | ICD-10-CM | POA: Diagnosis not present

## 2022-10-15 DIAGNOSIS — Z992 Dependence on renal dialysis: Secondary | ICD-10-CM | POA: Diagnosis not present

## 2022-10-15 DIAGNOSIS — N186 End stage renal disease: Secondary | ICD-10-CM | POA: Diagnosis not present

## 2022-10-17 DIAGNOSIS — Z992 Dependence on renal dialysis: Secondary | ICD-10-CM | POA: Diagnosis not present

## 2022-10-17 DIAGNOSIS — N186 End stage renal disease: Secondary | ICD-10-CM | POA: Diagnosis not present

## 2022-10-20 DIAGNOSIS — Z992 Dependence on renal dialysis: Secondary | ICD-10-CM | POA: Diagnosis not present

## 2022-10-20 DIAGNOSIS — N186 End stage renal disease: Secondary | ICD-10-CM | POA: Diagnosis not present

## 2022-10-22 DIAGNOSIS — N186 End stage renal disease: Secondary | ICD-10-CM | POA: Diagnosis not present

## 2022-10-22 DIAGNOSIS — Z992 Dependence on renal dialysis: Secondary | ICD-10-CM | POA: Diagnosis not present

## 2022-10-24 DIAGNOSIS — Z794 Long term (current) use of insulin: Secondary | ICD-10-CM | POA: Diagnosis not present

## 2022-10-24 DIAGNOSIS — Z992 Dependence on renal dialysis: Secondary | ICD-10-CM | POA: Diagnosis not present

## 2022-10-24 DIAGNOSIS — E1122 Type 2 diabetes mellitus with diabetic chronic kidney disease: Secondary | ICD-10-CM | POA: Diagnosis not present

## 2022-10-24 DIAGNOSIS — N186 End stage renal disease: Secondary | ICD-10-CM | POA: Diagnosis not present

## 2022-10-27 DIAGNOSIS — Z992 Dependence on renal dialysis: Secondary | ICD-10-CM | POA: Diagnosis not present

## 2022-10-27 DIAGNOSIS — N186 End stage renal disease: Secondary | ICD-10-CM | POA: Diagnosis not present

## 2022-10-29 DIAGNOSIS — Z992 Dependence on renal dialysis: Secondary | ICD-10-CM | POA: Diagnosis not present

## 2022-10-29 DIAGNOSIS — N186 End stage renal disease: Secondary | ICD-10-CM | POA: Diagnosis not present

## 2022-10-30 ENCOUNTER — Ambulatory Visit: Payer: 59 | Admitting: Orthopedic Surgery

## 2022-10-31 DIAGNOSIS — Z992 Dependence on renal dialysis: Secondary | ICD-10-CM | POA: Diagnosis not present

## 2022-10-31 DIAGNOSIS — N186 End stage renal disease: Secondary | ICD-10-CM | POA: Diagnosis not present

## 2022-11-03 DIAGNOSIS — N186 End stage renal disease: Secondary | ICD-10-CM | POA: Diagnosis not present

## 2022-11-03 DIAGNOSIS — Z992 Dependence on renal dialysis: Secondary | ICD-10-CM | POA: Diagnosis not present

## 2022-11-04 ENCOUNTER — Ambulatory Visit: Payer: 59 | Admitting: Family

## 2022-11-04 ENCOUNTER — Encounter: Payer: Self-pay | Admitting: Family

## 2022-11-04 ENCOUNTER — Ambulatory Visit (INDEPENDENT_AMBULATORY_CARE_PROVIDER_SITE_OTHER): Payer: 59 | Admitting: Family

## 2022-11-04 DIAGNOSIS — B351 Tinea unguium: Secondary | ICD-10-CM

## 2022-11-04 DIAGNOSIS — E1142 Type 2 diabetes mellitus with diabetic polyneuropathy: Secondary | ICD-10-CM

## 2022-11-04 DIAGNOSIS — M79672 Pain in left foot: Secondary | ICD-10-CM

## 2022-11-04 MED ORDER — AMITRIPTYLINE HCL 10 MG PO TABS: 10.0000 mg | ORAL_TABLET | Freq: Every day | ORAL | 1 refills | Status: DC

## 2022-11-04 NOTE — Progress Notes (Signed)
Office Visit Note   Patient: [name]           Date of Birth: 03/13/1941           MRN: [id number] Visit Date: 11/04/2022              Requested by: Mirna Mires, MD [address] [place],  [place] [address] PCP: Mirna Mires, MD  Chief Complaint  Patient presents with   Right Foot - Pain      HPI: The patient is an 82 year old woman who presented complaining of left foot pain this is predominantly to the plantar aspect of the left great toe it radiates over the plantar aspect dorsum of the foot especially at night she has difficulty sleeping due to pain she must sit up at the side of the bed and rub her feet no relief with doing so.  She has previously tried gabapentin as well as Lyrica for her neuropathic pain without relief.  Had some side effects and would prefer not to try these again. No worsening of her symptoms with ambulation  Would also like a nail trim  Assessment & Plan: Visit Diagnoses:  1. 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 diabetes mellitus (HCC)   2. Onychomycosis   3. Pain in left foot     Plan: Will trial her on a course of amitriptyline for the next 4 weeks for her neuropathic pain.  Discussed the course of peripheral neuropathy patient and daughter voiced understanding will also try using menthol salve topically  May consider using Cymbalta if no relief from amitriptyline  Follow-Up Instructions: No follow-ups on file.   Left Ankle Exam  Left ankle exam is normal.      Patient is alert, oriented, no adenopathy, well-dressed, normal affect, normal respiratory effort. On examination of the left foot she does have decreased sensation to the distal aspect of her foot along the medial column.  There are no dystrophic changes no erythema no warmth no edema no point tenderness  Thickened and discolored onychomycotic nails x 9.  These were trimmed today without incident.  As the patient is unable to safely trim her own  nails. Imaging:  No results found. No images are attached to the encounter.  Labs: Lab Results  Component Value Date   HGBA1C 8.8 (H) 06/02/2022   HGBA1C 8.1 (A) 02/03/2022   HGBA1C 8.7 (A) 07/25/2021   ESRSEDRATE 42 (H) 01/06/2020   CRP 20.3 (H) 01/06/2020   REPTSTATUS 06/23/2022 FINAL 06/18/2022   GRAMSTAIN NO WBC SEEN NO ORGANISMS SEEN  06/18/2022   CULT  06/18/2022    No growth aerobically or anaerobically. Performed at [facility], [address] [place], [place] [address]      Lab Results  Component Value Date   ALBUMIN 2.3 (L) 06/30/2022   ALBUMIN 2.1 (L) 06/27/2022   ALBUMIN 2.3 (L) 06/26/2022    Lab Results  Component Value Date   MG 1.6 (L) 06/21/2022   MG 1.7 06/19/2022   MG 1.8 06/15/2022   No results found for: "VD25OH"  No results found for: "PREALBUMIN"    Latest Ref Rng & Units 07/04/2022    7:03 AM 06/30/2022    7:10 AM 06/27/2022    7:54 AM  CBC EXTENDED  WBC 4.0 - 10.5 K/uL 14.7  9.3  11.4   RBC 3.87 - 5.11 MIL/uL 3.33  2.81  2.56   Hemoglobin 12.0 - 15.0 g/dL 78.4  8.7  8.0   HCT 36.0 - 46.0 % 32.6  26.5  24.0   Platelets 150 - 400 K/uL 172  180  167      There is no height or weight on file to calculate BMI.  Orders:  No orders of the defined types were placed in this encounter.  Meds ordered this encounter  Medications   amitriptyline (ELAVIL) 10 MG tablet    Sig: Take 1 tablet (10 mg total) by mouth at bedtime.    Dispense:  30 tablet    Refill:  1     Procedures: No procedures performed  Clinical Data: No additional findings.  ROS:  All other systems negative, except as noted in the HPI. Review of Systems  Objective: Vital Signs: There were no vitals taken for this visit.  Specialty Comments:  MRI LUMBAR SPINE WITHOUT CONTRAST   TECHNIQUE: Multiplanar, multisequence MR imaging of the lumbar spine was performed. No intravenous contrast was administered.   COMPARISON:  CT 03/23/2020    FINDINGS: Segmentation:  Standard.   Alignment: Grade 1 anterolisthesis of L4 on L5. Slight lumbar dextrocurvature.   Vertebrae: No fracture, evidence of discitis, or bone lesion. Superior endplate Schmorl's node of L1.   Conus medullaris and cauda equina: Conus extends to the T12-L1 level. Conus appears normal. Slight bunching of the cauda equina nerve roots above the L4-5 level of stenosis.   Paraspinal and other soft tissues: Colonic diverticulosis. Cortically based T2 hyperintense lesionswithin the bilateral kidneys, incompletely characterized, but most likely represent cysts.   Disc levels:   T12-L1: Minimal disc bulge. Unremarkable facet joints. No foraminal or canal stenosis.   L1-L2: Unremarkable disc. Minimal facet arthropathy. No foraminal or canal stenosis.   L2-L3: Mild annular disc bulge. Mild bilateral facet arthropathy. No canal stenosis. Borderline-mild right foraminal stenosis.   L3-L4: No disc protrusion. Mild bilateral facet arthropathy. No canal stenosis. Borderline-mild bilateral foraminal stenosis.   L4-L5: Anterolisthesis with disc uncovering and diffuse disc bulge. Severe bilateral facet arthropathy and ligamentum flavum buckling. Findings result in severe canal stenosis with severe left and moderate right foraminal stenosis.   L5-S1: Diffuse disc bulge, eccentric to the right with endplate osteophytic ridging. Mild bilateral facet arthropathy. Moderate right and mild left foraminal stenosis. No canal stenosis.   IMPRESSION: 1. Multilevel lumbar spondylosis as described above, most pronounced at L4-5 where there is severe canal stenosis with severe left and moderate right foraminal stenosis. 2. Moderate right and mild left foraminal stenosis at L5-S1.     Electronically Signed   By: Duanne Guess D.O.   On: 08/24/2021 20:31  PMFS History: Patient Active Problem List   Diagnosis Date Noted   Acute blood loss anemia 06/22/2022    Hemothorax 06/22/2022   Hemothorax on left 06/21/2022   Loculated pleural effusion 06/21/2022   Need for management of chest tube 06/20/2022   Delirium 06/14/2022   GERD (gastroesophageal reflux disease) 06/10/2022   CAP (community acquired pneumonia) 06/10/2022   ESRD (end stage renal disease) (HCC) 06/09/2022   DMII (diabetes mellitus, type 2) (HCC) 06/09/2022   Multifocal pneumonia 06/02/2022   Pleural effusion 06/02/2022   End stage renal disease on dialysis [facility]) 06/02/2022   Spinal stenosis of lumbar region 01/07/2022   Schatzki's ring 10/15/2021   Esophageal dysphagia 10/15/2021   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   Hypertensive heart and CKD, 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/2020   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 due to type 2 diabetes mellitus (HCC) 11/09/2020  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 diabetes mellitus (HCC) 11/09/2020   Dependence on renal dialysis (HCC) 11/09/2020   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 disease (HCC) 11/09/2020   Hyperlipidemia associated with type 2 diabetes mellitus (HCC) 11/09/2020   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 10/26/2020   Hypertensive urgency 10/26/2020   History of CVA (cerebrovascular accident) 01/05/2020   Thrombocytopenia (HCC) 01/05/2020   History of colonic polyps 04/01/2018   Acute bronchitis 10/15/2017   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 (HCC)    HCAP (healthcare-associated pneumonia) 05/29/2015   Hyperkalemia 05/29/2015   ESRD (end stage renal disease) on dialysis (HCC)    Gastro-esophageal reflux disease without esophagitis 09/29/2014   Encounter for  adequacy testing for hemodialysis (HCC) 01/17/2014   Mechanical complication of other vascular device, implant, and graft 01/17/2014   Disturbances of vision, late effect of stroke 11/03/2013   Right hemiparesis (HCC) 08/19/2013   CVA (cerebral infarction) 07/20/2013   Gastroparesis due to DM (HCC) 07/01/2013   Type 2 diabetes mellitus with hyperglycemia (HCC) 06/23/2013   Anemia 01/26/2013   Fall 05/25/2012   HLD (hyperlipidemia) 05/20/2006   Hereditary and idiopathic peripheral neuropathy 05/20/2006   Essential hypertension 05/20/2006   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   Dialysis patient (HCC)    M, W, F   Fistula    R arm   GERD (gastroesophageal reflux disease)    Hypertension    Renal disorder    Shortness of breath    Stroke (HCC)    right side weakness    Family History  Problem Relation Age of Onset   Cancer Sister    Cancer Brother    Anesthesia problems Neg Hx    Hypotension Neg Hx    Malignant hyperthermia Neg Hx    Pseudochol deficiency Neg Hx     Past Surgical History:  Procedure Laterality Date   AMPUTATION Right 01/13/2020   Procedure: AMPUTATION RIGHT FOOT 1ST RAY;  Surgeon: Nadara Mustard, MD;  Location: [facility] OR;  Service: Orthopedics;  Laterality: Right;   AV FISTULA PLACEMENT Right 09/08/2013   Procedure: CREATION OF RIGHT BRACHIAL CEPHALIC ARTERIOVENOUS FISTULA ;  Surgeon: Pryor Ochoa, MD;  Location: [facility] OR;  Service: Vascular;  Laterality: Right;   BASCILIC VEIN TRANSPOSITION Right 01/26/2014   Procedure: Right Arm BASILIC VEIN TRANSPOSITION;  Surgeon: Pryor Ochoa, MD;  Location: [facility] OR;  Service: Vascular;  Laterality: Right;   BIOPSY  09/10/2021   Procedure: BIOPSY;  Surgeon: Dolores Frame, MD;  Location: [facility];  Service: Gastroenterology;;  gastric   BIOPSY  10/07/2021    Procedure: BIOPSY;  Surgeon: Lemar Lofty., MD;  Location: [facility];  Service: Gastroenterology;;   BIOPSY  04/17/2022   Procedure: BIOPSY;  Surgeon: Lemar Lofty., MD;  Location: [facility];  Service: Gastroenterology;;   CATARACT EXTRACTION W/PHACO  11/20/2011   Procedure: CATARACT EXTRACTION PHACO AND INTRAOCULAR LENS PLACEMENT (IOC);  Surgeon: Gemma Payor, MD;  Location: [facility];  Service: Ophthalmology;  Laterality: Right;  CDE 18.82   CATARACT EXTRACTION W/PHACO Left 11/18/2012   Procedure: CATARACT EXTRACTION PHACO AND INTRAOCULAR LENS PLACEMENT (IOC);  Surgeon: Gemma Payor, MD;  Location: [facility];  Service: Ophthalmology;  Laterality: Left;  CDE: 18.97   COLONOSCOPY N/A 02/09/2013   Procedure: COLONOSCOPY;  Surgeon: Malissa Hippo, MD;  [facility]: [facility];  Service: Endoscopy;  Laterality: N/A;  305-moved to 220 Ann to notify pt   COLONOSCOPY N/A 07/08/2018   Procedure: COLONOSCOPY;  Surgeon: Malissa Hippo, MD;  Location: [facility];  Service: Endoscopy;  Laterality: N/A;  930   ENDOSCOPIC MUCOSAL RESECTION N/A 10/07/2021   Procedure: ENDOSCOPIC MUCOSAL RESECTION;  Surgeon: Meridee Score Netty Starring., MD;  Location: [facility];  Service: Gastroenterology;  Laterality: N/A;   ESOPHAGOGASTRODUODENOSCOPY N/A 04/17/2022   Procedure: ESOPHAGOGASTRODUODENOSCOPY (EGD);  Surgeon: Lemar Lofty., MD;  Location: [facility];  Service: Gastroenterology;  Laterality: N/A;   ESOPHAGOGASTRODUODENOSCOPY (EGD) WITH PROPOFOL N/A 09/10/2021   Procedure: ESOPHAGOGASTRODUODENOSCOPY (EGD) WITH PROPOFOL;  Surgeon: Dolores Frame, MD;  [facility];  Service: Gastroenterology;  Laterality: N/A;   ESOPHAGOGASTRODUODENOSCOPY (EGD) WITH PROPOFOL N/A 10/07/2021   Procedure: ESOPHAGOGASTRODUODENOSCOPY (EGD) WITH PROPOFOL;  Surgeon: Meridee Score Netty Starring., MD;  Location: [facility];  Service: Gastroenterology;  Laterality: N/A;   EUS N/A 10/07/2021   Procedure:  UPPER ENDOSCOPIC ULTRASOUND (EUS) RADIAL;  Surgeon: Lemar Lofty., MD;  Location: [facility];  Service: Gastroenterology;  Laterality: N/A;   GASTRIC VARICES BANDING  10/07/2021   Procedure: duodenal tumor BANDING;  Surgeon: Meridee Score Netty Starring., MD;  Location: [facility];  Service: Gastroenterology;;  with emr kit   HEMOSTASIS CLIP PLACEMENT  10/07/2021   Procedure: HEMOSTASIS CLIP PLACEMENT;  Surgeon: Lemar Lofty., MD;  Location: [facility];  Service: Gastroenterology;;   INSERTION OF DIALYSIS CATHETER Right 06/24/2013   Procedure: INSERTION OF DIALYSIS CATHETER: Ultrasound guided;  Surgeon: Nada Libman, MD;  Location: [facility] OR;  Service: Vascular;  Laterality: Right;   LIGATION OF ARTERIOVENOUS  FISTULA Right 01/26/2014   Procedure: LIGATION OF ARTERIOVENOUS  FISTULA;  Surgeon: Pryor Ochoa, MD;  Location: [facility] OR;  Service: Vascular;  Laterality: Right;   LOOP RECORDER IMPLANT N/A 07/21/2013   Procedure: LOOP RECORDER IMPLANT;  Surgeon: Marinus Maw, MD;  Location[facility];  Service: Cardiovascular;  Laterality: N/A;   POLYPECTOMY  07/08/2018   Procedure: POLYPECTOMY;  Surgeon: Malissa Hippo, MD;  [facility];  Service: Endoscopy;;  Descending colon polyps x 2    SUBMUCOSAL LIFTING INJECTION  10/07/2021   Procedure: SUBMUCOSAL LIFTING INJECTION;  Surgeon: Lemar Lofty., MD;  Location: [facility];  Service: Gastroenterology;;   TEE WITHOUT CARDIOVERSION N/A 07/21/2013   Procedure: TRANSESOPHAGEAL ECHOCARDIOGRAM (TEE);  Surgeon: Lars Masson, MD;  Location: [facility] ENDOSCOPY;  Service: Cardiovascular;  Laterality: N/A;   UPPER ESOPHAGEAL ENDOSCOPIC ULTRASOUND (EUS) N/A 04/17/2022   Procedure: UPPER ESOPHAGEAL ENDOSCOPIC ULTRASOUND (EUS);  Surgeon: Lemar Lofty., MD;  Location: [facility];  Service: Gastroenterology;  Laterality: N/A;   Social History   Occupational History   Not on file  Tobacco Use   Smoking status: Never     Passive exposure: Never   Smokeless tobacco: Never  Vaping Use   Vaping Use: Never used  Substance and Sexual Activity   Alcohol use: No   Drug use: No   Sexual activity: Not on file

## 2022-11-05 DIAGNOSIS — Z992 Dependence on renal dialysis: Secondary | ICD-10-CM | POA: Diagnosis not present

## 2022-11-05 DIAGNOSIS — N186 End stage renal disease: Secondary | ICD-10-CM | POA: Diagnosis not present

## 2022-11-06 ENCOUNTER — Ambulatory Visit (INDEPENDENT_AMBULATORY_CARE_PROVIDER_SITE_OTHER): Payer: 59 | Admitting: Gastroenterology

## 2022-11-07 DIAGNOSIS — N186 End stage renal disease: Secondary | ICD-10-CM | POA: Diagnosis not present

## 2022-11-07 DIAGNOSIS — Z992 Dependence on renal dialysis: Secondary | ICD-10-CM | POA: Diagnosis not present

## 2022-11-10 DIAGNOSIS — Z992 Dependence on renal dialysis: Secondary | ICD-10-CM | POA: Diagnosis not present

## 2022-11-10 DIAGNOSIS — N186 End stage renal disease: Secondary | ICD-10-CM | POA: Diagnosis not present

## 2022-11-12 DIAGNOSIS — Z992 Dependence on renal dialysis: Secondary | ICD-10-CM | POA: Diagnosis not present

## 2022-11-12 DIAGNOSIS — N186 End stage renal disease: Secondary | ICD-10-CM | POA: Diagnosis not present

## 2022-11-13 ENCOUNTER — Ambulatory Visit: Payer: 59 | Admitting: "Endocrinology

## 2022-11-14 DIAGNOSIS — N186 End stage renal disease: Secondary | ICD-10-CM | POA: Diagnosis not present

## 2022-11-14 DIAGNOSIS — Z992 Dependence on renal dialysis: Secondary | ICD-10-CM | POA: Diagnosis not present

## 2022-11-14 DIAGNOSIS — D510 Vitamin B12 deficiency anemia due to intrinsic factor deficiency: Secondary | ICD-10-CM | POA: Diagnosis not present

## 2022-11-17 DIAGNOSIS — N186 End stage renal disease: Secondary | ICD-10-CM | POA: Diagnosis not present

## 2022-11-17 DIAGNOSIS — Z992 Dependence on renal dialysis: Secondary | ICD-10-CM | POA: Diagnosis not present

## 2022-11-18 DIAGNOSIS — E1122 Type 2 diabetes mellitus with diabetic chronic kidney disease: Secondary | ICD-10-CM | POA: Diagnosis not present

## 2022-11-18 DIAGNOSIS — I10 Essential (primary) hypertension: Secondary | ICD-10-CM | POA: Diagnosis not present

## 2022-11-18 DIAGNOSIS — N186 End stage renal disease: Secondary | ICD-10-CM | POA: Diagnosis not present

## 2022-11-18 DIAGNOSIS — E7800 Pure hypercholesterolemia, unspecified: Secondary | ICD-10-CM | POA: Diagnosis not present

## 2022-11-18 DIAGNOSIS — E785 Hyperlipidemia, unspecified: Secondary | ICD-10-CM | POA: Diagnosis not present

## 2022-11-18 DIAGNOSIS — D510 Vitamin B12 deficiency anemia due to intrinsic factor deficiency: Secondary | ICD-10-CM | POA: Diagnosis not present

## 2022-11-19 DIAGNOSIS — Z992 Dependence on renal dialysis: Secondary | ICD-10-CM | POA: Diagnosis not present

## 2022-11-19 DIAGNOSIS — N186 End stage renal disease: Secondary | ICD-10-CM | POA: Diagnosis not present

## 2022-11-21 DIAGNOSIS — N186 End stage renal disease: Secondary | ICD-10-CM | POA: Diagnosis not present

## 2022-11-21 DIAGNOSIS — Z992 Dependence on renal dialysis: Secondary | ICD-10-CM | POA: Diagnosis not present

## 2022-11-23 DIAGNOSIS — Z794 Long term (current) use of insulin: Secondary | ICD-10-CM | POA: Diagnosis not present

## 2022-11-23 DIAGNOSIS — E1122 Type 2 diabetes mellitus with diabetic chronic kidney disease: Secondary | ICD-10-CM | POA: Diagnosis not present

## 2022-11-24 DIAGNOSIS — N186 End stage renal disease: Secondary | ICD-10-CM | POA: Diagnosis not present

## 2022-11-24 DIAGNOSIS — Z992 Dependence on renal dialysis: Secondary | ICD-10-CM | POA: Diagnosis not present

## 2022-11-26 DIAGNOSIS — N186 End stage renal disease: Secondary | ICD-10-CM | POA: Diagnosis not present

## 2022-11-26 DIAGNOSIS — Z992 Dependence on renal dialysis: Secondary | ICD-10-CM | POA: Diagnosis not present

## 2022-11-28 DIAGNOSIS — N186 End stage renal disease: Secondary | ICD-10-CM | POA: Diagnosis not present

## 2022-11-28 DIAGNOSIS — T82858A Stenosis of vascular prosthetic devices, implants and grafts, initial encounter: Secondary | ICD-10-CM | POA: Diagnosis not present

## 2022-11-28 DIAGNOSIS — Z992 Dependence on renal dialysis: Secondary | ICD-10-CM | POA: Diagnosis not present

## 2022-11-28 DIAGNOSIS — I871 Compression of vein: Secondary | ICD-10-CM | POA: Diagnosis not present

## 2022-11-29 DIAGNOSIS — Z992 Dependence on renal dialysis: Secondary | ICD-10-CM | POA: Diagnosis not present

## 2022-11-29 DIAGNOSIS — N186 End stage renal disease: Secondary | ICD-10-CM | POA: Diagnosis not present

## 2022-12-01 DIAGNOSIS — N186 End stage renal disease: Secondary | ICD-10-CM | POA: Diagnosis not present

## 2022-12-01 DIAGNOSIS — Z992 Dependence on renal dialysis: Secondary | ICD-10-CM | POA: Diagnosis not present

## 2022-12-03 DIAGNOSIS — N186 End stage renal disease: Secondary | ICD-10-CM | POA: Diagnosis not present

## 2022-12-03 DIAGNOSIS — Z992 Dependence on renal dialysis: Secondary | ICD-10-CM | POA: Diagnosis not present

## 2022-12-05 DIAGNOSIS — N186 End stage renal disease: Secondary | ICD-10-CM | POA: Diagnosis not present

## 2022-12-05 DIAGNOSIS — Z992 Dependence on renal dialysis: Secondary | ICD-10-CM | POA: Diagnosis not present

## 2022-12-08 DIAGNOSIS — N186 End stage renal disease: Secondary | ICD-10-CM | POA: Diagnosis not present

## 2022-12-08 DIAGNOSIS — Z992 Dependence on renal dialysis: Secondary | ICD-10-CM | POA: Diagnosis not present

## 2022-12-09 ENCOUNTER — Ambulatory Visit (INDEPENDENT_AMBULATORY_CARE_PROVIDER_SITE_OTHER): Payer: 59 | Admitting: Family

## 2022-12-09 ENCOUNTER — Encounter: Payer: Self-pay | Admitting: Family

## 2022-12-09 DIAGNOSIS — M792 Neuralgia and neuritis, unspecified: Secondary | ICD-10-CM

## 2022-12-09 DIAGNOSIS — E1142 Type 2 diabetes mellitus with diabetic polyneuropathy: Secondary | ICD-10-CM

## 2022-12-09 DIAGNOSIS — D510 Vitamin B12 deficiency anemia due to intrinsic factor deficiency: Secondary | ICD-10-CM | POA: Diagnosis not present

## 2022-12-09 MED ORDER — DULOXETINE HCL 20 MG PO CPEP: 20.0000 mg | ORAL_CAPSULE | Freq: Every day | ORAL | 3 refills | Status: DC

## 2022-12-09 NOTE — Progress Notes (Signed)
Office Visit Note   Patient: [name]           Date of Birth: 01/02/1941           MRN: [id number] Visit Date: 12/09/2022              Requested by: Mirna Mires, MD [address] [place],  [place] [address] PCP: Mirna Mires, MD  Chief Complaint  Patient presents with   Right Foot - Follow-up      HPI: Patient is an 82 year old woman who presents in follow-up for neuropathic pain bilateral feet left worse than right.  Complains of worse pain in the evening difficulty sleeping due to pain.  She has thus far had no relief with any topicals no relief from gabapentin Lyrica or amitriptyline.  Recently began a course of amitriptyline but this made her have spasms.  Daughter reports that she did not take it more than a few days have also tried lidocaine patches without relief  Report blood sugars are labile was 248 this morning.  Assessment & Plan: Visit Diagnoses: No diagnosis found.  Plan: Willing to trial course of Cymbalta.  Discussed at length.  Will follow-up in 6 weeks.  Have also discussed possibility of using capsaicin which they have not yet tried  Follow-Up Instructions: Return in about 6 weeks (around 01/20/2023).   Ortho Exam  Patient is alert, oriented, no adenopathy, well-dressed, normal affect, normal respiratory effort. On examination of the left foot she does have decreased sensation to the distal aspect of her foot along the medial column.  There are no dystrophic changes no erythema no warmth no edema no point tenderness. Right first ray surgically absent.     Imaging: No results found. No images are attached to the encounter.  Labs: Lab Results  Component Value Date   HGBA1C 8.8 (H) 06/02/2022   HGBA1C 8.1 (A) 02/03/2022   HGBA1C 8.7 (A) 07/25/2021   ESRSEDRATE 42 (H) 01/06/2020   CRP 20.3 (H) 01/06/2020   REPTSTATUS 06/23/2022 FINAL 06/18/2022   GRAMSTAIN NO WBC SEEN NO ORGANISMS SEEN  06/18/2022   CULT  06/18/2022    No growth  aerobically or anaerobically. Performed at [facility], [address], [place], [place] [address]      Lab Results  Component Value Date   ALBUMIN 2.3 (L) 06/30/2022   ALBUMIN 2.1 (L) 06/27/2022   ALBUMIN 2.3 (L) 06/26/2022    Lab Results  Component Value Date   MG 1.6 (L) 06/21/2022   MG 1.7 06/19/2022   MG 1.8 06/15/2022   No results found for: "VD25OH"  No results found for: "PREALBUMIN"    Latest Ref Rng & Units 07/04/2022    7:03 AM 06/30/2022    7:10 AM 06/27/2022    7:54 AM  CBC EXTENDED  WBC 4.0 - 10.5 K/uL 14.7  9.3  11.4   RBC 3.87 - 5.11 MIL/uL 3.33  2.81  2.56   Hemoglobin 12.0 - 15.0 g/dL 86.5  8.7  8.0   HCT 78.4 - 46.0 % 32.6  26.5  24.0   Platelets 150 - 400 K/uL 172  180  167      There is no height or weight on file to calculate BMI.  Orders:  No orders of the defined types were placed in this encounter.  No orders of the defined types were placed in this encounter.    Procedures: No procedures performed  Clinical Data: No additional findings.  ROS:  All other systems negative, except as noted in the HPI. Review of Systems  Objective: Vital Signs: There were no vitals taken for this visit.  Specialty Comments:  MRI LUMBAR SPINE WITHOUT CONTRAST   TECHNIQUE: Multiplanar, multisequence MR imaging of the lumbar spine was performed. No intravenous contrast was administered.   COMPARISON:  CT 03/23/2020   FINDINGS: Segmentation:  Standard.   Alignment: Grade 1 anterolisthesis of L4 on L5. Slight lumbar dextrocurvature.   Vertebrae: No fracture, evidence of discitis, or bone lesion. Superior endplate Schmorl's node of L1.   Conus medullaris and cauda equina: Conus extends to the T12-L1 level. Conus appears normal. Slight bunching of the cauda equina nerve roots above the L4-5 level of stenosis.   Paraspinal and other soft tissues: Colonic diverticulosis. Cortically based T2 hyperintense lesionswithin the  bilateral kidneys, incompletely characterized, but most likely represent cysts.   Disc levels:   T12-L1: Minimal disc bulge. Unremarkable facet joints. No foraminal or canal stenosis.   L1-L2: Unremarkable disc. Minimal facet arthropathy. No foraminal or canal stenosis.   L2-L3: Mild annular disc bulge. Mild bilateral facet arthropathy. No canal stenosis. Borderline-mild right foraminal stenosis.   L3-L4: No disc protrusion. Mild bilateral facet arthropathy. No canal stenosis. Borderline-mild bilateral foraminal stenosis.   L4-L5: Anterolisthesis with disc uncovering and diffuse disc bulge. Severe bilateral facet arthropathy and ligamentum flavum buckling. Findings result in severe canal stenosis with severe left and moderate right foraminal stenosis.   L5-S1: Diffuse disc bulge, eccentric to the right with endplate osteophytic ridging. Mild bilateral facet arthropathy. Moderate right and mild left foraminal stenosis. No canal stenosis.   IMPRESSION: 1. Multilevel lumbar spondylosis as described above, most pronounced at L4-5 where there is severe canal stenosis with severe left and moderate right foraminal stenosis. 2. Moderate right and mild left foraminal stenosis at L5-S1.     Electronically Signed   By: Duanne Guess D.O.   On: 08/24/2021 20:31  PMFS History: Patient Active Problem List   Diagnosis Date Noted   Acute blood loss anemia 06/22/2022   Hemothorax 06/22/2022   Hemothorax on left 06/21/2022   Loculated pleural effusion 06/21/2022   Need for management of chest tube 06/20/2022   Delirium 06/14/2022   GERD (gastroesophageal reflux disease) 06/10/2022   CAP (community acquired pneumonia) 06/10/2022   ESRD (end stage renal disease) (HCC) 06/09/2022   DMII (diabetes mellitus, type 2) (HCC) 06/09/2022   Multifocal pneumonia 06/02/2022   Pleural effusion 06/02/2022   End stage renal disease on dialysis (HCC) 06/02/2022   Spinal stenosis of lumbar  region 01/07/2022   Schatzki's ring 10/15/2021   Esophageal dysphagia 10/15/2021   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   Hypertensive heart and CKD, 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/2020   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 due to type 2 diabetes mellitus (HCC) 11/09/2020   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 diabetes mellitus (HCC) 11/09/2020   Dependence on renal dialysis (HCC) 11/09/2020   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 disease (HCC) 11/09/2020   Hyperlipidemia associated with type 2 diabetes mellitus (HCC) 11/09/2020   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 10/26/2020   Hypertensive urgency 10/26/2020   History of CVA (cerebrovascular accident) 01/05/2020  Thrombocytopenia (HCC) 01/05/2020   History of colonic polyps 04/01/2018   Acute bronchitis 10/15/2017   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 (HCC)    HCAP (healthcare-associated pneumonia) 05/29/2015   Hyperkalemia 05/29/2015   ESRD (end stage renal disease) on dialysis [facility])    Gastro-esophageal reflux disease without esophagitis 09/29/2014   Encounter for adequacy testing for hemodialysis (HCC) 01/17/2014   Mechanical complication of other vascular device, implant, and graft 01/17/2014   Disturbances of vision, late effect of stroke 11/03/2013   Right hemiparesis (HCC) 08/19/2013   CVA (cerebral infarction) 07/20/2013   Gastroparesis due to DM (HCC) 07/01/2013   Type 2 diabetes mellitus with hyperglycemia (HCC) 06/23/2013   Anemia 01/26/2013   Fall 05/25/2012   HLD (hyperlipidemia) 05/20/2006   Hereditary and idiopathic peripheral neuropathy 05/20/2006    Essential hypertension 05/20/2006   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   Dialysis patient (HCC)    M, W, F   Fistula    R arm   GERD (gastroesophageal reflux disease)    Hypertension    Renal disorder    Shortness of breath    Stroke (HCC)    right side weakness    Family History  Problem Relation Age of Onset   Cancer Sister    Cancer Brother    Anesthesia problems Neg Hx    Hypotension Neg Hx    Malignant hyperthermia Neg Hx    Pseudochol deficiency Neg Hx     Past Surgical History:  Procedure Laterality Date   AMPUTATION Right 01/13/2020   Procedure: AMPUTATION RIGHT FOOT 1ST RAY;  Surgeon: Nadara Mustard, MD;  Location: [facility] OR;  Service: Orthopedics;  Laterality: Right;   AV FISTULA PLACEMENT Right 09/08/2013   Procedure: CREATION OF RIGHT BRACHIAL CEPHALIC ARTERIOVENOUS FISTULA ;  Surgeon: Pryor Ochoa, MD;  Location: [facility] OR;  Service: Vascular;  Laterality: Right;   BASCILIC VEIN TRANSPOSITION Right 01/26/2014   Procedure: Right Arm BASILIC VEIN TRANSPOSITION;  Surgeon: Pryor Ochoa, MD;  Location: [facility] OR;  Service: Vascular;  Laterality: Right;   BIOPSY  09/10/2021   Procedure: BIOPSY;  Surgeon: Dolores Frame, MD;  Location: [facility];  Service: Gastroenterology;;  gastric   BIOPSY  10/07/2021   Procedure: BIOPSY;  Surgeon: Lemar Lofty., MD;  Location: [facility];  Service: Gastroenterology;;   BIOPSY  04/17/2022   Procedure: BIOPSY;  Surgeon: Lemar Lofty., MD;  Location: [facility];  Service: Gastroenterology;;   CATARACT EXTRACTION W/PHACO  11/20/2011   Procedure: CATARACT EXTRACTION PHACO AND INTRAOCULAR LENS PLACEMENT (IOC);  Surgeon: Gemma Payor, MD;  Location: [facility];  Service: Ophthalmology;  Laterality: Right;  CDE 18.82   CATARACT EXTRACTION W/PHACO Left 11/18/2012    Procedure: CATARACT EXTRACTION PHACO AND INTRAOCULAR LENS PLACEMENT (IOC);  Surgeon: Gemma Payor, MD;  Location: [facility];  Service: Ophthalmology;  Laterality: Left;  CDE: 18.97   COLONOSCOPY N/A 02/09/2013   Procedure: COLONOSCOPY;  Surgeon: Malissa Hippo, MD;  [facility]: [facility];  Service: Endoscopy;  Laterality: N/A;  305-moved to 220 Ann to notify pt   COLONOSCOPY N/A 07/08/2018   Procedure: COLONOSCOPY;  Surgeon: Malissa Hippo, MD;  [facility];  Service: Endoscopy;  Laterality: N/A;  930  ENDOSCOPIC MUCOSAL RESECTION N/A 10/07/2021   Procedure: ENDOSCOPIC MUCOSAL RESECTION;  Surgeon: Meridee Score Netty Starring., MD;  Location: [facility];  Service: Gastroenterology;  Laterality: N/A;   ESOPHAGOGASTRODUODENOSCOPY N/A 04/17/2022   Procedure: ESOPHAGOGASTRODUODENOSCOPY (EGD);  Surgeon: Lemar Lofty., MD;  Location: [facility];  Service: Gastroenterology;  Laterality: N/A;   ESOPHAGOGASTRODUODENOSCOPY (EGD) WITH PROPOFOL N/A 09/10/2021   Procedure: ESOPHAGOGASTRODUODENOSCOPY (EGD) WITH PROPOFOL;  Surgeon: Dolores Frame, MD;  [facility];  Service: Gastroenterology;  Laterality: N/A;   ESOPHAGOGASTRODUODENOSCOPY (EGD) WITH PROPOFOL N/A 10/07/2021   Procedure: ESOPHAGOGASTRODUODENOSCOPY (EGD) WITH PROPOFOL;  Surgeon: Meridee Score Netty Starring., MD;  Location: [facility];  Service: Gastroenterology;  Laterality: N/A;   EUS N/A 10/07/2021   Procedure: UPPER ENDOSCOPIC ULTRASOUND (EUS) RADIAL;  Surgeon: Lemar Lofty., MD;  Location: [facility];  Service: Gastroenterology;  Laterality: N/A;   GASTRIC VARICES BANDING  10/07/2021   Procedure: duodenal tumor BANDING;  Surgeon: Meridee Score Netty Starring., MD;  Location: [facility];  Service: Gastroenterology;;  with emr kit   HEMOSTASIS CLIP PLACEMENT  10/07/2021   Procedure: HEMOSTASIS CLIP PLACEMENT;  Surgeon: Lemar Lofty., MD;  Location: [facility];  Service: Gastroenterology;;    INSERTION OF DIALYSIS CATHETER Right 06/24/2013   Procedure: INSERTION OF DIALYSIS CATHETER: Ultrasound guided;  Surgeon: Nada Libman, MD;  Location: [facility] OR;  Service: Vascular;  Laterality: Right;   LIGATION OF ARTERIOVENOUS  FISTULA Right 01/26/2014   Procedure: LIGATION OF ARTERIOVENOUS  FISTULA;  Surgeon: Pryor Ochoa, MD;  Location: [facility] OR;  Service: Vascular;  Laterality: Right;   LOOP RECORDER IMPLANT N/A 07/21/2013   Procedure: LOOP RECORDER IMPLANT;  Surgeon: Marinus Maw, MD;  Location: [facility] CATH LAB;  Service: Cardiovascular;  Laterality: N/A;   POLYPECTOMY  07/08/2018   Procedure: POLYPECTOMY;  Surgeon: Malissa Hippo, MD;  [facility];  Service: Endoscopy;;  Descending colon polyps x 2    SUBMUCOSAL LIFTING INJECTION  10/07/2021   Procedure: SUBMUCOSAL LIFTING INJECTION;  Surgeon: Lemar Lofty., MD;  Location: [facility];  Service: Gastroenterology;;   TEE WITHOUT CARDIOVERSION N/A 07/21/2013   Procedure: TRANSESOPHAGEAL ECHOCARDIOGRAM (TEE);  Surgeon: Lars Masson, MD;  Location: [facility] ENDOSCOPY;  Service: Cardiovascular;  Laterality: N/A;   UPPER ESOPHAGEAL ENDOSCOPIC ULTRASOUND (EUS) N/A 04/17/2022   Procedure: UPPER ESOPHAGEAL ENDOSCOPIC ULTRASOUND (EUS);  Surgeon: Lemar Lofty., MD;  Location: [facility];  Service: Gastroenterology;  Laterality: N/A;   Social History   Occupational History   Not on file  Tobacco Use   Smoking status: Never    Passive exposure: Never   Smokeless tobacco: Never  Vaping Use   Vaping Use: Never used  Substance and Sexual Activity   Alcohol use: No   Drug use: No   Sexual activity: Not on file

## 2022-12-10 DIAGNOSIS — Z992 Dependence on renal dialysis: Secondary | ICD-10-CM | POA: Diagnosis not present

## 2022-12-10 DIAGNOSIS — N186 End stage renal disease: Secondary | ICD-10-CM | POA: Diagnosis not present

## 2022-12-14 DIAGNOSIS — N186 End stage renal disease: Secondary | ICD-10-CM | POA: Diagnosis not present

## 2022-12-14 DIAGNOSIS — Z992 Dependence on renal dialysis: Secondary | ICD-10-CM | POA: Diagnosis not present

## 2022-12-15 ENCOUNTER — Ambulatory Visit: Payer: 59 | Admitting: "Endocrinology

## 2022-12-15 DIAGNOSIS — N186 End stage renal disease: Secondary | ICD-10-CM | POA: Diagnosis not present

## 2022-12-15 DIAGNOSIS — Z992 Dependence on renal dialysis: Secondary | ICD-10-CM | POA: Diagnosis not present

## 2022-12-17 DIAGNOSIS — N186 End stage renal disease: Secondary | ICD-10-CM | POA: Diagnosis not present

## 2022-12-17 DIAGNOSIS — Z992 Dependence on renal dialysis: Secondary | ICD-10-CM | POA: Diagnosis not present

## 2022-12-19 DIAGNOSIS — Z992 Dependence on renal dialysis: Secondary | ICD-10-CM | POA: Diagnosis not present

## 2022-12-19 DIAGNOSIS — N186 End stage renal disease: Secondary | ICD-10-CM | POA: Diagnosis not present

## 2022-12-22 DIAGNOSIS — E119 Type 2 diabetes mellitus without complications: Secondary | ICD-10-CM | POA: Diagnosis not present

## 2022-12-22 DIAGNOSIS — Z992 Dependence on renal dialysis: Secondary | ICD-10-CM | POA: Diagnosis not present

## 2022-12-22 DIAGNOSIS — N186 End stage renal disease: Secondary | ICD-10-CM | POA: Diagnosis not present

## 2022-12-23 ENCOUNTER — Ambulatory Visit (HOSPITAL_COMMUNITY)
Admission: RE | Admit: 2022-12-23 | Discharge: 2022-12-23 | Disposition: A | Discharge status: Home / Self Care | Payer: 59 | Source: Ambulatory Visit | Attending: Family | Admitting: Family

## 2022-12-23 DIAGNOSIS — E1142 Type 2 diabetes mellitus with diabetic polyneuropathy: Secondary | ICD-10-CM | POA: Diagnosis not present

## 2022-12-23 DIAGNOSIS — M792 Neuralgia and neuritis, unspecified: Secondary | ICD-10-CM | POA: Diagnosis not present

## 2022-12-23 DIAGNOSIS — Z794 Long term (current) use of insulin: Secondary | ICD-10-CM | POA: Diagnosis not present

## 2022-12-23 DIAGNOSIS — E1122 Type 2 diabetes mellitus with diabetic chronic kidney disease: Secondary | ICD-10-CM | POA: Diagnosis not present

## 2022-12-23 DIAGNOSIS — I739 Peripheral vascular disease, unspecified: Secondary | ICD-10-CM | POA: Diagnosis not present

## 2022-12-23 LAB — VAS US PAD ABI
Left ABI: 1.02
Right ABI: 1.15

## 2022-12-24 ENCOUNTER — Telehealth: Payer: Self-pay | Admitting: Family

## 2022-12-24 DIAGNOSIS — Z992 Dependence on renal dialysis: Secondary | ICD-10-CM | POA: Diagnosis not present

## 2022-12-24 DIAGNOSIS — N186 End stage renal disease: Secondary | ICD-10-CM | POA: Diagnosis not present

## 2022-12-24 NOTE — Telephone Encounter (Signed)
Received call from patient's daughter [name] needing the results from vein and vascular for her mother. [name] said she can not read.  [name] said it's to blurry to read. The number to contact [name] is [phone]

## 2022-12-25 ENCOUNTER — Telehealth: Payer: Self-pay | Admitting: Orthopedic Surgery

## 2022-12-25 NOTE — Telephone Encounter (Signed)
Pt's daughter Gershon Cull called for PA Denny Peon again asking for results from Vein [facility] Vascular results. Please call her at [phone].

## 2022-12-25 NOTE — Telephone Encounter (Signed)
This is duplicate message, will delete. See previous message.

## 2022-12-26 DIAGNOSIS — N186 End stage renal disease: Secondary | ICD-10-CM | POA: Diagnosis not present

## 2022-12-26 DIAGNOSIS — Z992 Dependence on renal dialysis: Secondary | ICD-10-CM | POA: Diagnosis not present

## 2022-12-26 NOTE — Telephone Encounter (Signed)
Pt's daughter informed of normal ABI. She stated that pt is in a lot of pain. Dtr took pt off of cymbalta, made her jerk. She has nothing to take. Has an appt with vascular on Tuesday. Says she will see if they can get her something for pain relief after I offered to see what we can do in the mean time. She said she shouldn't have to suffer like this, I let her know again I can see what we can offer.

## 2022-12-29 ENCOUNTER — Ambulatory Visit (INDEPENDENT_AMBULATORY_CARE_PROVIDER_SITE_OTHER): Payer: 59 | Admitting: Gastroenterology

## 2022-12-29 ENCOUNTER — Encounter (INDEPENDENT_AMBULATORY_CARE_PROVIDER_SITE_OTHER): Payer: Self-pay | Admitting: Gastroenterology

## 2022-12-29 VITALS — BP 159/75 | HR 77 | Temp 98.0°F | Ht 67.0 in | Wt 157.6 lb

## 2022-12-29 DIAGNOSIS — K219 Gastro-esophageal reflux disease without esophagitis: Secondary | ICD-10-CM | POA: Diagnosis not present

## 2022-12-29 DIAGNOSIS — D649 Anemia, unspecified: Secondary | ICD-10-CM

## 2022-12-29 DIAGNOSIS — N186 End stage renal disease: Secondary | ICD-10-CM | POA: Diagnosis not present

## 2022-12-29 DIAGNOSIS — Z992 Dependence on renal dialysis: Secondary | ICD-10-CM | POA: Diagnosis not present

## 2022-12-29 DIAGNOSIS — R7989 Other specified abnormal findings of blood chemistry: Secondary | ICD-10-CM | POA: Diagnosis not present

## 2022-12-29 DIAGNOSIS — R1319 Other dysphagia: Secondary | ICD-10-CM

## 2022-12-29 NOTE — Patient Instructions (Signed)
We will get you scheduled for EGD for evaluation of your swallowing Continue with omeprazole twice a day and continue with softer foods to avoid choking, make sure you are taking small bites, chewing well and taking sips of liquids between bites  Please complete the hemachromatosis lab work as discussed  We will send a message over to [facility] to ensure they get you scheduled for EUS in November as planned  Follow up 4 months   It was a pleasure to see you today. I want to create trusting relationships with patients and provide genuine, compassionate, and quality care. I truly value your feedback! please be on the lookout for a survey regarding your visit with me today. I appreciate your input about our visit and your time in completing this!    Bertran Zeimet L. Jeanmarie Hubert, MSN, APRN, AGNP-C Adult-Gerontology Nurse Practitioner [facility]

## 2022-12-29 NOTE — Progress Notes (Signed)
Referring Provider: Mirna Mires, MD Primary Care Physician:  Mirna Mires, MD Primary GI Physician: Levon Hedger   Chief Complaint  Patient presents with   Dysphagia    Patient having trouble swallowing. Has to eat soft foods. Thinks esophagus needs stretching again.    Anemia    Follow up on anemia. Has not noticed any blood in stools.    HPI:   [name] is a 82 y.o. female with past medical history of  anemia, CKD, CVA, diabetes, GERD, hypertension   Patient presenting today for follow up of GERD/dysphagia, anemia, NET   History: EGD under [facility] March 2023, referred to Dr. 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 Patient underwent EUS on 10/07/21 with resection of tumor, chromagranin A level: 634.2 on 10/10/21, patient scheduled for CT A/P w/contrast (11/21/21) and tentative recall for EUS in 6 months, recommendations to repeat Chromagranin A levels in 2 months and proceed with previously scheduled CT A/P, consider PET scan if CT abnormal. elevated Chromagranin A off PPI therapy in August, EGD/EUS done 04/17/22 as outlined below, plan for repeat EGD/EUS in 1 year. DETECTNET PET-CT to be done after next EUS.   At last visit in February 2024, patient feeling good, did report recent blood transfusion in December.   Recommended to check iron studies, folate, B12 and stool cards, continue PPI, repeat EGD/EUS 1 year, PET scan after that, consider colonoscopy if iron studies low/FOBT positive   Last hgb in January was 10.2 Iron studies on 2/20 with TIBC 195, Ferritin 1365, iron 49, sat 25%B12 2000, folate 23.9, stool cards negative x3, advised to do hemochromatosis testing which has not yet been completed   Present:  [name] notes that [name] is having issues with swallowing. Feels that foods dont want to go down and will sometimes come right back up. [name] is trying to do mostly soft foods. [name] has no issues with her pills or liquids.  Denies heartburn or acid regurgitation on omeprazole 40mg  BID. Feels that symptoms started worsening about 1 month ago. [name] has had dysphagia in the past secondary to Schatzki ring  Denies any abdominal pain. No nausea or vomiting. Appetite is good. No weight loss  Denies rectal bleeding or melena.   Last EGD/EUS: nov 2023  EGD impression: (biopsy negative) - No gross lesions in the proximal esophagus and in   the mid esophagus.  - White nummular lesions in esophageal mucosa in the distal esophagus. Biopsied to rule out Candida.                           - Tortuous esophagus throughout.                           - Non-obstructing Schatzki ring.                           - 4 cm hiatal hernia.                           - Erythematous mucosa in the antrum -previously                            biopsied and negative for HP so not rebiopsied.                           -  Duodenal scar in the bulb, mostly flat but 1                            small 1 mm appearing polypoid region was noted.                            This was biopsied to rule out recurrent/persistent                            neuroendocrine tumor.                           - No other gross lesions in the duodenal bulb, in                            the first portion of the duodenum and in the second                            portion of the duodenum.                           EUS impression:                           - No evidence of any intramural lesion noted in the                            duodenal bulb and apex of the duodenum.                           - No abnormality noted of the wall of the stomach                            distally.                           - No malignant-appearing lymph nodes were                            visualized in the celiac region (level 20),                            peripancreatic region and porta hepatis region Last Colonoscopy:07/08/18 Diverticulosis in the entire examined colon. -  Two 5 to 6 mm polyps in the proximal descending colon, removed with a cold snare. Resected and retrieved. - External hemorrhoids. EGD/EUS: 10/07/21  EGD Impression: Tortuous esophagus. Low-grade of narrowing Schatzki ring - disrupted. Z-line regular, 36 cm from the incisors. - 4 cm hiatal hernia. - Gastritis - previously HP negative on biopsies. J-shaped deformity of the entire stomach. Submucosal nodule found in the duodenum. After EUS, and based on prior pathology, Cap-Lift-Band EMR performed. Clips (MR conditional) were placed. - No other gross lesions in the duodenal bulb, in the first portion of the duodenum and in the second portion of the duodenum. EUS Impression: - An intramural (subepithelial) lesion was found in the  duodenal bulb. The lesion appeared to originate from within the deep mucosa (Layer 2). A tissue diagnosis was obtained prior to this exam. This is consistent with previously biopsied neuroendocrine tumor. - No malignant-appearing lymph nodes were visualized in the gastrohepatic ligament (level 18), perigastric region, porta hepatis region and upper-abdominal periaortic region. - Multiple stones were visualized endosonographically in the gallbladder.     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   Dialysis patient (HCC)    M, W, F   Fistula    R arm   GERD (gastroesophageal reflux disease)    Hypertension    Renal disorder    Shortness of breath    Stroke [facility])    right side weakness    Past Surgical History:  Procedure Laterality Date   AMPUTATION Right 01/13/2020   Procedure: AMPUTATION RIGHT FOOT 1ST RAY;  Surgeon: Nadara Mustard, MD;  Location: [facility] OR;  Service: Orthopedics;  Laterality: Right;   AV FISTULA PLACEMENT Right 09/08/2013   Procedure: CREATION OF RIGHT BRACHIAL CEPHALIC ARTERIOVENOUS FISTULA ;   Surgeon: Pryor Ochoa, MD;  Location: [facility] OR;  Service: Vascular;  Laterality: Right;   BASCILIC VEIN TRANSPOSITION Right 01/26/2014   Procedure: Right Arm BASILIC VEIN TRANSPOSITION;  Surgeon: Pryor Ochoa, MD;  Location: [facility] OR;  Service: Vascular;  Laterality: Right;   BIOPSY  09/10/2021   Procedure: BIOPSY;  Surgeon: Dolores Frame, MD;  Location: [facility];  Service: Gastroenterology;;  gastric   BIOPSY  10/07/2021   Procedure: BIOPSY;  Surgeon: Lemar Lofty., MD;  Location: [facility];  Service: Gastroenterology;;   BIOPSY  04/17/2022   Procedure: BIOPSY;  Surgeon: Lemar Lofty., MD;  Location: [facility];  Service: Gastroenterology;;   CATARACT EXTRACTION W/PHACO  11/20/2011   Procedure: CATARACT EXTRACTION PHACO AND INTRAOCULAR LENS PLACEMENT (IOC);  Surgeon: Gemma Payor, MD;  Location: [facility] ORS;  Service: Ophthalmology;  Laterality: Right;  CDE 18.82   CATARACT EXTRACTION W/PHACO Left 11/18/2012   Procedure: CATARACT EXTRACTION PHACO AND INTRAOCULAR LENS PLACEMENT (IOC);  Surgeon: Gemma Payor, MD;  Location: [facility];  Service: Ophthalmology;  Laterality: Left;  CDE: 18.97   COLONOSCOPY N/A 02/09/2013   Procedure: COLONOSCOPY;  Surgeon: Malissa Hippo, MD;  [facility]: [facility];  Service: Endoscopy;  Laterality: N/A;  305-moved to 220 Ann to notify pt   COLONOSCOPY N/A 07/08/2018   Procedure: COLONOSCOPY;  Surgeon: Malissa Hippo, MD;  [facility];  Service: Endoscopy;  Laterality: N/A;  930   ENDOSCOPIC MUCOSAL RESECTION N/A 10/07/2021   Procedure: ENDOSCOPIC MUCOSAL RESECTION;  Surgeon: Meridee Score Netty Starring., MD;  Location: [facility];  Service: Gastroenterology;  Laterality: N/A;   ESOPHAGOGASTRODUODENOSCOPY N/A 04/17/2022   Procedure: ESOPHAGOGASTRODUODENOSCOPY (EGD);  Surgeon: Lemar Lofty., MD;  Location: [facility];  Service: Gastroenterology;  Laterality: N/A;   ESOPHAGOGASTRODUODENOSCOPY (EGD) WITH PROPOFOL N/A 09/10/2021    Procedure: ESOPHAGOGASTRODUODENOSCOPY (EGD) WITH PROPOFOL;  Surgeon: Dolores Frame, MD;  [facility];  Service: Gastroenterology;  Laterality: N/A;   ESOPHAGOGASTRODUODENOSCOPY (EGD) WITH PROPOFOL N/A 10/07/2021   Procedure: ESOPHAGOGASTRODUODENOSCOPY (EGD) WITH PROPOFOL;  Surgeon: Meridee Score Netty Starring., MD;  Location: [facility];  Service: Gastroenterology;  Laterality: N/A;   EUS N/A 10/07/2021   Procedure: UPPER ENDOSCOPIC ULTRASOUND (EUS) RADIAL;  Surgeon:  Mansouraty, Netty Starring., MD;  Location: [facility];  Service: Gastroenterology;  Laterality: N/A;   GASTRIC VARICES BANDING  10/07/2021   Procedure: duodenal tumor BANDING;  Surgeon: Meridee Score Netty Starring., MD;  Location: [facility];  Service: Gastroenterology;;  with emr kit   HEMOSTASIS CLIP PLACEMENT  10/07/2021   Procedure: HEMOSTASIS CLIP PLACEMENT;  Surgeon: Lemar Lofty., MD;  Location: [facility];  Service: Gastroenterology;;   INSERTION OF DIALYSIS CATHETER Right 06/24/2013   Procedure: INSERTION OF DIALYSIS CATHETER: Ultrasound guided;  Surgeon: Nada Libman, MD;  Location: [facility] OR;  Service: Vascular;  Laterality: Right;   LIGATION OF ARTERIOVENOUS  FISTULA Right 01/26/2014   Procedure: LIGATION OF ARTERIOVENOUS  FISTULA;  Surgeon: Pryor Ochoa, MD;  Location: [facility] OR;  Service: Vascular;  Laterality: Right;   LOOP RECORDER IMPLANT N/A 07/21/2013   Procedure: LOOP RECORDER IMPLANT;  Surgeon: Marinus Maw, MD;  Location: [facility] CATH LAB;  Service: Cardiovascular;  Laterality: N/A;   POLYPECTOMY  07/08/2018   Procedure: POLYPECTOMY;  Surgeon: Malissa Hippo, MD;  [facility]: [facility];  Service: Endoscopy;;  Descending colon polyps x 2    SUBMUCOSAL LIFTING INJECTION  10/07/2021   Procedure: SUBMUCOSAL LIFTING INJECTION;  Surgeon: Lemar Lofty., MD;  Location: [facility];  Service: Gastroenterology;;   TEE WITHOUT CARDIOVERSION N/A 07/21/2013   Procedure: TRANSESOPHAGEAL  ECHOCARDIOGRAM (TEE);  Surgeon: Lars Masson, MD;  Location: [name] ENDOSCOPY;  Service: Cardiovascular;  Laterality: N/A;   UPPER ESOPHAGEAL ENDOSCOPIC ULTRASOUND (EUS) N/A 04/17/2022   Procedure: UPPER ESOPHAGEAL ENDOSCOPIC ULTRASOUND (EUS);  Surgeon: Lemar Lofty., MD;  Location: [facility];  Service: Gastroenterology;  Laterality: N/A;    Current Outpatient Medications  Medication Sig Dispense Refill   albuterol (VENTOLIN HFA) 108 (90 Base) MCG/ACT inhaler Inhale 2 puffs into the lungs every 6 (six) hours as needed for wheezing or shortness of breath. 1 each 0   aspirin 325 MG tablet Take 650 mg by mouth daily.     BD INSULIN SYRINGE U/F 31G X 5/16" 0.3 ML MISC Inject 1 each into the skin 2 (two) times daily. as directed 100 each 0   cyanocobalamin (,VITAMIN B-12,) 1000 MCG/ML injection Inject 1,000 mcg into the muscle every 30 (thirty) days.  11   diclofenac Sodium (VOLTAREN) 1 % GEL Apply 4 g topically 4 (four) times daily as needed. Apply to bilateral knees 100 g 0   insulin aspart protamine- aspart (NOVOLOG MIX 70/30) (70-30) 100 UNIT/ML injection Inject 20 units with breakfast and 10 units with supper if pre-meal glucose readings are above 90 ad [name] is eating 10 mL 0   multivitamin (RENA-VIT) TABS tablet Take 1 tablet by mouth at bedtime. 30 tablet 0   omeprazole (PRILOSEC) 40 MG capsule TAKE ONE CAPSULE BY MOUTH TWICE DAILY BEFORE A meal (Patient taking differently: Take 40 mg by mouth in the morning and at bedtime.) 180 capsule 3   DULoxetine (CYMBALTA) 20 MG capsule Take 1 capsule (20 mg total) by mouth daily. (Patient not taking: Reported on 12/29/2022) 30 capsule 3   sevelamer carbonate (RENVELA) 800 MG tablet Take 1 tablet (800 mg total) by mouth 2 (two) times daily before lunch and supper. (Patient not taking: Reported on 12/29/2022) 60 tablet 0   No current facility-administered medications for this visit.    Allergies as of 12/29/2022 - Review Complete 12/29/2022   Allergen Reaction Noted   Ambien [zolpidem] Other (See Comments) 07/12/2013   Reglan [metoclopramide] Other (See Comments) 08/02/2014  Family History  Problem Relation Age of Onset   Cancer Sister    Cancer Brother    Anesthesia problems Neg Hx    Hypotension Neg Hx    Malignant hyperthermia Neg Hx    Pseudochol deficiency Neg Hx     Social History   Socioeconomic History   Marital status: Widowed    Spouse name: Not on file   Number of children: Not on file   Years of education: Not on file   Highest education level: Not on file  Occupational History   Not on file  Tobacco Use   Smoking status: Never    Passive exposure: Never   Smokeless tobacco: Never  Vaping Use   Vaping status: Never Used  Substance and Sexual Activity   Alcohol use: No   Drug use: No   Sexual activity: Not on file  Other Topics Concern   Not on file  Social History Narrative   ** Merged History Encounter **       Social Determinants of Health   Financial Resource Strain: Not on file  Food Insecurity: No Food Insecurity (06/13/2022)   Hunger Vital Sign    Worried About Running Out of Food in the Last Year: Never true    Ran Out of Food in the Last Year: Never true  Transportation Needs: No Transportation Needs (06/13/2022)   PRAPARE - Administrator, Civil Service (Medical): No    Lack of Transportation (Non-Medical): No  Physical Activity: Not on file  Stress: Not on file  Social Connections: Not on file   Review of systems General: negative for malaise, night sweats, fever, chills, weight loss Neck: Negative for lumps, goiter, pain and significant neck swelling Resp: Negative for cough, wheezing, dyspnea at rest CV: Negative for chest pain, leg swelling, palpitations, orthopnea GI: denies melena, hematochezia, nausea, vomiting, diarrhea, constipation, odyonophagia, early satiety or unintentional weight loss. +dysphagia  MSK: Negative for joint pain or swelling, back  pain, and muscle pain. Derm: Negative for itching or rash Psych: Denies depression, anxiety, memory loss, confusion. No homicidal or suicidal ideation.  Heme: Negative for prolonged bleeding, bruising easily, and swollen nodes. Endocrine: Negative for cold or heat intolerance, polyuria, polydipsia and goiter. Neuro: negative for tremor, gait imbalance, syncope and seizures. The remainder of the review of systems is noncontributory.  Physical Exam: BP (!) 159/75 (BP Location: Left Arm, Patient Position: Sitting, Cuff Size: Normal)   Pulse 77   Temp 98 F (36.7 C) (Oral)   Ht 5\' 7"  (1.702 m)   Wt 157 lb 9.6 oz (71.5 kg)   BMI 24.68 kg/m  General:   Alert and oriented. No distress noted. Pleasant and cooperative.  Head:  Normocephalic and atraumatic. Eyes:  Conjuctiva clear without scleral icterus. Mouth:  Oral mucosa pink and moist. Good dentition. No lesions. Heart: Normal rate and rhythm, s1 and s2 heart sounds present.  Lungs: Clear lung sounds in all lobes. Respirations equal and unlabored. Abdomen:  +BS, soft, non-tender and non-distended. No rebound or guarding. No HSM or masses noted. Derm: No palmar erythema or jaundice Msk:  Symmetrical without gross deformities. Normal posture. Extremities:  Without edema. Neurologic:  Alert and  oriented x4 Psych:  Alert and cooperative. Normal mood and affect.  Invalid input(s): "6 MONTHS"   ASSESSMENT: ARMINTA GAMM is a 82 y.o. female presenting today for dysphagia and follow up of Anemia, duodenal NET, GERD/dysphagia   GERD/Dysphagia: history of food impaction in early 2023 secondary  to schatzki ring, [name] notes worsening dysphagia for the past month, trying to do softer foods. GERD well controlled on omeprazole 40mg  BID. Schatzki ring again noted on EUS/EGD in November 2023. Recommended to continue with soft foods/chewing precautions, schedule EGD +/- dilation.  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 Notably with negative stool cards  x3 earlier this year as well as iron studies with normal iron and sat, elevated ferritin, [name] was advised to do hemochromatosis testing but this has not yet been completed. [name] denies rectal bleeding or melena. Last hgb was 10.2 in January 2024. 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 chromagranin A level: 634.2 on 10/10/21, PET done July 2023 with no metastatic evidence, elevated Chromagranin A off PPI therapy in August, EGD/EUS done 04/17/22 as outlined above, plan for repeat EGD/EUS in 1 year. DETECTNET PET-CT to be done after next EUS. Will send referral back to [facility] to ensure they have her on recall list to schedule repeat EUS around November 2024.    PLAN:  Continue PPI BID 2. Referral to [facility] (Dr. Meridee Score) to schedule repeat EUS in November  3. Schedule EGD +/- dilation ASA III  4. Complete hemachromatosis lab  5. Chewing precautions   All questions were answered, patient verbalized understanding and is in agreement with plan as outlined above.   Follow Up: 4 months   Loghan Subia L. Jeanmarie Hubert, MSN, APRN, AGNP-C Adult-Gerontology Nurse Practitioner [facility]  I have reviewed the note and agree with the APP's assessment as described in this progress note  Katrinka Blazing, MD Gastroenterology and Hepatology [facility]

## 2022-12-29 NOTE — Progress Notes (Unsigned)
[facility] [place]  ASSESSMENT / PLAN: 82 y.o. female with bilateral foot discomfort. I am suspcious she has neuropathy. Her clinical exam and non-invasive testing are reassuring, and suggest a non-arterial etiology for her pain. I recommended she be evalauted by a pain specialist given her difficulty finding a pharmacologic agent to help treat his discomfort.  CHIEF COMPLAINT: bilateral foot pain  HISTORY OF PRESENT ILLNESS: [name] is a 82 y.o. female who presents clinic for evaluation of bilateral foot pain.  She has previously been diagnosed with neuropathy.  She has not been able to take gabapentin because of mental status changes.  Pain in her feet is very bothersome.  He will bother her throughout the day and keeps her up at night.  She describes a abnormal sensation over the plantar foot bilaterally her symptoms are not totally consistent with ischemic rest pain.  She does not have claudication type symptoms.  She has no ulcers about her feet.  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   Dialysis patient (HCC)    M, W, F   Fistula    R arm   GERD (gastroesophageal reflux disease)    Hypertension    Renal disorder    Shortness of breath    Stroke [facility])    right side weakness    Past Surgical History:  Procedure Laterality Date   AMPUTATION Right 01/13/2020   Procedure: AMPUTATION RIGHT FOOT 1ST RAY;  Surgeon: Nadara Mustard, MD;  Location: [facility] OR;  Service: Orthopedics;  Laterality: Right;   AV FISTULA PLACEMENT Right 09/08/2013   Procedure: CREATION OF RIGHT BRACHIAL CEPHALIC ARTERIOVENOUS FISTULA ;  Surgeon: Pryor Ochoa, MD;  Location: [facility] OR;  Service: Vascular;  Laterality: Right;   BASCILIC VEIN TRANSPOSITION Right 01/26/2014   Procedure: Right Arm BASILIC VEIN TRANSPOSITION;  Surgeon: Pryor Ochoa, MD;  Location: [facility] OR;  Service: Vascular;  Laterality: Right;   BIOPSY  09/10/2021   Procedure: BIOPSY;  Surgeon: Dolores Frame, MD;  Location: [facility]ITE;  Service: Gastroenterology;;  gastric   BIOPSY  10/07/2021   Procedure: BIOPSY;  Surgeon: Lemar Lofty., MD;  Location: [facility]COPY;  Service: Gastroenterology;;   BIOPSY  04/17/2022   Procedure: BIOPSY;  Surgeon: Lemar Lofty., MD;  Location: [facility] ENDOSCOPY;  Service: Gastroenterology;;   CATARACT EXTRACTION W/PHACO  11/20/2011   Procedure: CATARACT EXTRACTION PHACO AND INTRAOCULAR LENS PLACEMENT (IOC);  Surgeon: Gemma Payor, MD;  Location: [facility] ORS;  Service: Ophthalmology;  Laterality: Right;  CDE 18.82   CATARACT EXTRACTION W/PHACO Left 11/18/2012   Procedure: CATARACT EXTRACTION PHACO AND INTRAOCULAR LENS PLACEMENT (IOC);  Surgeon: Gemma Payor, MD;  Location: [facility] ORS;  Service: Ophthalmology;  Laterality: Left;  CDE: 18.97   COLONOSCOPY N/A 02/09/2013   Procedure: COLONOSCOPY;  Surgeon: Malissa Hippo, MD;  [facility]: [facility];  Service: Endoscopy;  Laterality: N/A;  305-moved to 220 Ann to notify pt   COLONOSCOPY N/A 07/08/2018   Procedure: COLONOSCOPY;  Surgeon: Malissa Hippo, MD;  [facility]: [facility];  Service: Endoscopy;  Laterality: N/A;  930   ENDOSCOPIC MUCOSAL RESECTION N/A 10/07/2021   Procedure: ENDOSCOPIC MUCOSAL RESECTION;  Surgeon: Meridee Score Netty Starring., MD;  Location: [facility] ENDOSCOPY;  Service: Gastroenterology;  Laterality: N/A;  ESOPHAGOGASTRODUODENOSCOPY N/A 04/17/2022   Procedure: ESOPHAGOGASTRODUODENOSCOPY (EGD);  Surgeon: Lemar Lofty., MD;  Location: [facility];  Service: Gastroenterology;  Laterality: N/A;   ESOPHAGOGASTRODUODENOSCOPY (EGD) WITH PROPOFOL N/A 09/10/2021   Procedure: ESOPHAGOGASTRODUODENOSCOPY (EGD) WITH PROPOFOL;  Surgeon: Dolores Frame, MD;  [facility];  Service: Gastroenterology;  Laterality: N/A;    ESOPHAGOGASTRODUODENOSCOPY (EGD) WITH PROPOFOL N/A 10/07/2021   Procedure: ESOPHAGOGASTRODUODENOSCOPY (EGD) WITH PROPOFOL;  Surgeon: Meridee Score Netty Starring., MD;  Location: [facility] ENDOSCOPY;  Service: Gastroenterology;  Laterality: N/A;   EUS N/A 10/07/2021   Procedure: UPPER ENDOSCOPIC ULTRASOUND (EUS) RADIAL;  Surgeon: Lemar Lofty., MD;  Location: [facility] ENDOSCOPY;  Service: Gastroenterology;  Laterality: N/A;   GASTRIC VARICES BANDING  10/07/2021   Procedure: duodenal tumor BANDING;  Surgeon: Meridee Score Netty Starring., MD;  Location: [facility];  Service: Gastroenterology;;  with emr kit   HEMOSTASIS CLIP PLACEMENT  10/07/2021   Procedure: HEMOSTASIS CLIP PLACEMENT;  Surgeon: Lemar Lofty., MD;  Location: [facility];  Service: Gastroenterology;;   INSERTION OF DIALYSIS CATHETER Right 06/24/2013   Procedure: INSERTION OF DIALYSIS CATHETER: Ultrasound guided;  Surgeon: Nada Libman, MD;  Location: [facility] OR;  Service: Vascular;  Laterality: Right;   LIGATION OF ARTERIOVENOUS  FISTULA Right 01/26/2014   Procedure: LIGATION OF ARTERIOVENOUS  FISTULA;  Surgeon: Pryor Ochoa, MD;  Location: [facility] OR;  Service: Vascular;  Laterality: Right;   LOOP RECORDER IMPLANT N/A 07/21/2013   Procedure: LOOP RECORDER IMPLANT;  Surgeon: Marinus Maw, MD;  Location: [facility] CATH LAB;  Service: Cardiovascular;  Laterality: N/A;   POLYPECTOMY  07/08/2018   Procedure: POLYPECTOMY;  Surgeon: Malissa Hippo, MD;  [facility]: [facility];  Service: Endoscopy;;  Descending colon polyps x 2    SUBMUCOSAL LIFTING INJECTION  10/07/2021   Procedure: SUBMUCOSAL LIFTING INJECTION;  Surgeon: Lemar Lofty., MD;  Location: [facility] ENDOSCOPY;  Service: Gastroenterology;;   TEE WITHOUT CARDIOVERSION N/A 07/21/2013   Procedure: TRANSESOPHAGEAL ECHOCARDIOGRAM (TEE);  Surgeon: Lars Masson, MD;  Location: [facility] ENDOSCOPY;  Service: Cardiovascular;  Laterality: N/A;   UPPER ESOPHAGEAL ENDOSCOPIC ULTRASOUND (EUS) N/A 04/17/2022    Procedure: UPPER ESOPHAGEAL ENDOSCOPIC ULTRASOUND (EUS);  Surgeon: Lemar Lofty., MD;  Location: [facility];  Service: Gastroenterology;  Laterality: N/A;    Family History  Problem Relation Age of Onset   Cancer Sister    Cancer Brother    Anesthesia problems Neg Hx    Hypotension Neg Hx    Malignant hyperthermia Neg Hx    Pseudochol deficiency Neg Hx     Social History   Socioeconomic History   Marital status: Widowed    Spouse name: Not on file   Number of children: Not on file   Years of education: Not on file   Highest education level: Not on file  Occupational History   Not on file  Tobacco Use   Smoking status: Never    Passive exposure: Never   Smokeless tobacco: Never  Vaping Use   Vaping status: Never Used  Substance and Sexual Activity   Alcohol use: No   Drug use: No   Sexual activity: Not on file  Other Topics Concern   Not on file  Social History Narrative   ** Merged History Encounter **       Social Determinants of Health   Financial Resource Strain: Not on file  Food Insecurity: No Food Insecurity (06/13/2022)   Hunger Vital Sign    Worried About Running Out of Food in  the Last Year: Never true    Ran Out of Food in the Last Year: Never true  Transportation Needs: No Transportation Needs (06/13/2022)   PRAPARE - Administrator, Civil Service (Medical): No    Lack of Transportation (Non-Medical): No  Physical Activity: Not on file  Stress: Not on file  Social Connections: Not on file  Intimate Partner Violence: Not At Risk (06/13/2022)   Humiliation, Afraid, Rape, and Kick questionnaire    Fear of Current or Ex-Partner: No    Emotionally Abused: No    Physically Abused: No    Sexually Abused: No    Allergies  Allergen Reactions   Ambien [Zolpidem] Other (See Comments)    Hallucinations    Reglan [Metoclopramide] Other (See Comments)    "makes me crazy"    Current Outpatient Medications  Medication Sig  Dispense Refill   albuterol (VENTOLIN HFA) 108 (90 Base) MCG/ACT inhaler Inhale 2 puffs into the lungs every 6 (six) hours as needed for wheezing or shortness of breath. 1 each 0   aspirin 325 MG tablet Take 650 mg by mouth daily.     BD INSULIN SYRINGE U/F 31G X 5/16" 0.3 ML MISC Inject 1 each into the skin 2 (two) times daily. as directed 100 each 0   cyanocobalamin (,VITAMIN B-12,) 1000 MCG/ML injection Inject 1,000 mcg into the muscle every 30 (thirty) days.  11   diclofenac Sodium (VOLTAREN) 1 % GEL Apply 4 g topically 4 (four) times daily as needed. Apply to bilateral knees 100 g 0   insulin aspart protamine- aspart (NOVOLOG MIX 70/30) (70-30) 100 UNIT/ML injection Inject 20 units with breakfast and 10 units with supper if pre-meal glucose readings are above 90 ad she is eating 10 mL 0   multivitamin (RENA-VIT) TABS tablet Take 1 tablet by mouth at bedtime. 30 tablet 0   omeprazole (PRILOSEC) 40 MG capsule TAKE ONE CAPSULE BY MOUTH TWICE DAILY BEFORE A meal (Patient taking differently: Take 40 mg by mouth in the morning and at bedtime.) 180 capsule 3   sevelamer carbonate (RENVELA) 800 MG tablet Take 1 tablet (800 mg total) by mouth 2 (two) times daily before lunch and supper. 60 tablet 0   DULoxetine (CYMBALTA) 20 MG capsule Take 1 capsule (20 mg total) by mouth daily. (Patient not taking: Reported on 12/29/2022) 30 capsule 3   No current facility-administered medications for this visit.    PHYSICAL EXAM Vitals:   12/30/22 1024  BP: (!) 181/70  Pulse: 74  Resp: 20  Temp: 98.3 F (36.8 C)  SpO2: 97%  Weight: 159 lb (72.1 kg)  Height: 5\' 7"  (1.702 m)   Elderly woman in no acute distress Regular rate and rhythm Unlabored breathing No palpable pedal pulses Feet are warm and well-perfused Normal Doppler flow in bilateral feet    PERTINENT LABORATORY AND RADIOLOGIC DATA  Most recent CBC    Latest Ref Rng & Units 07/04/2022    7:03 AM 06/30/2022    7:10 AM 06/27/2022    7:54 AM   CBC  WBC 4.0 - 10.5 K/uL 14.7  9.3  11.4   Hemoglobin 12.0 - 15.0 g/dL 40.9  8.7  8.0   Hematocrit 36.0 - 46.0 % 32.6  26.5  24.0   Platelets 150 - 400 K/uL 172  180  167      Most recent CMP    Latest Ref Rng & Units 07/04/2022    7:03 AM 06/30/2022    7:10  AM 06/27/2022    7:54 AM  CMP  Glucose 70 - 99 mg/dL 161  096  045   BUN 8 - 23 mg/dL 27  21  20    Creatinine 0.44 - 1.00 mg/dL 4.09  8.11  9.14   Sodium 135 - 145 mmol/L 135  133  135   Potassium 3.5 - 5.1 mmol/L 6.4  4.5  4.9   Chloride 98 - 111 mmol/L 96  93  97   CO2 22 - 32 mmol/L 28  28  27    Calcium 8.9 - 10.3 mg/dL 8.1  7.9  7.2     Renal function CrCl cannot be calculated (Patient's most recent lab result is older than the maximum 21 days allowed.).  Hemoglobin A1C (no units)  Date Value  03/25/2021 8.5   HbA1c, POC (controlled diabetic range) (%)  Date Value  02/03/2022 8.1 (A)   Hgb A1c MFr Bld (%)  Date Value  06/02/2022 8.8 (H)    LDL Chol Calc (NIH)  Date Value Ref Range Status  04/03/2022 63 0 - 99 mg/dL Final     Vascular Imaging:   +-------+-----------+-----------+------------+------------+  ABI/TBIToday's ABIToday's TBIPrevious ABIPrevious TBI  +-------+-----------+-----------+------------+------------+  Right 1.15       amputated  0.92        0.64          +-------+-----------+-----------+------------+------------+  Left  1.02       0.61       0.66        0.48          +-------+-----------+-----------+------------+------------+   Rande Brunt. Lenell Antu, MD FACS [facility] Phone Number: [phone] 12/30/2022 12:12 PM   Total time spent on preparing this encounter including chart review, data review, collecting history, examining the patient, coordinating care for this new patient, 45 minutes.  Portions of this report may have been transcribed using voice recognition software.  Every effort has been made to ensure accuracy;  however, inadvertent computerized transcription errors may still be present.

## 2022-12-30 ENCOUNTER — Encounter: Payer: Self-pay | Admitting: Vascular Surgery

## 2022-12-30 ENCOUNTER — Ambulatory Visit (INDEPENDENT_AMBULATORY_CARE_PROVIDER_SITE_OTHER): Payer: 59 | Admitting: Vascular Surgery

## 2022-12-30 ENCOUNTER — Other Ambulatory Visit: Payer: Self-pay | Admitting: Family

## 2022-12-30 VITALS — BP 181/70 | HR 74 | Temp 98.3°F | Resp 20 | Ht 67.0 in | Wt 159.0 lb

## 2022-12-30 DIAGNOSIS — M79672 Pain in left foot: Secondary | ICD-10-CM | POA: Diagnosis not present

## 2022-12-30 DIAGNOSIS — M79671 Pain in right foot: Secondary | ICD-10-CM

## 2022-12-31 DIAGNOSIS — Z992 Dependence on renal dialysis: Secondary | ICD-10-CM | POA: Diagnosis not present

## 2022-12-31 DIAGNOSIS — N186 End stage renal disease: Secondary | ICD-10-CM | POA: Diagnosis not present

## 2023-01-01 ENCOUNTER — Telehealth (INDEPENDENT_AMBULATORY_CARE_PROVIDER_SITE_OTHER): Payer: Self-pay | Admitting: *Deleted

## 2023-01-01 NOTE — Telephone Encounter (Signed)
Patient's daughter Gershon Cull called to check status of scheduling EGD. I called her back and l let her know the schedule just opened up and the scheduler was out of office this afternoon and she would get back to her when she could.   [phone]

## 2023-01-02 DIAGNOSIS — Z992 Dependence on renal dialysis: Secondary | ICD-10-CM | POA: Diagnosis not present

## 2023-01-02 DIAGNOSIS — N186 End stage renal disease: Secondary | ICD-10-CM | POA: Diagnosis not present

## 2023-01-05 DIAGNOSIS — Z992 Dependence on renal dialysis: Secondary | ICD-10-CM | POA: Diagnosis not present

## 2023-01-05 DIAGNOSIS — N186 End stage renal disease: Secondary | ICD-10-CM | POA: Diagnosis not present

## 2023-01-07 ENCOUNTER — Encounter: Payer: Self-pay | Admitting: Physical Medicine & Rehabilitation

## 2023-01-07 DIAGNOSIS — N186 End stage renal disease: Secondary | ICD-10-CM | POA: Diagnosis not present

## 2023-01-07 DIAGNOSIS — Z992 Dependence on renal dialysis: Secondary | ICD-10-CM | POA: Diagnosis not present

## 2023-01-07 NOTE — Telephone Encounter (Signed)
Pt daughter Gershon Cull left voicemail wanting to know why she had not heard anything about scheduling her mother EGD. They were seen on 12/29/22. Schedule just came out.  Contacted daughter and scheduled pt for 02/03/23. Instructions mailed to patient. Will send letter with pre op appt.  Notification or Prior Authorization is not required for the requested services You are not required to submit a notification/prior authorization based on the information provided. If you have general questions about the prior authorization requirements, visit [email or web address] > Clinician Resources > Advance and Admission Notification Requirements. The number above acknowledges your notification. Please write this reference number down for future reference. If you would like to request an organization determination, please call us at [phone]. Decision ID #: [id number]

## 2023-01-09 ENCOUNTER — Other Ambulatory Visit: Payer: Self-pay

## 2023-01-09 ENCOUNTER — Encounter (HOSPITAL_COMMUNITY): Payer: Self-pay

## 2023-01-09 ENCOUNTER — Observation Stay (HOSPITAL_COMMUNITY)
Admission: EM | Admit: 2023-01-09 | Discharge: 2023-01-10 | Disposition: A | Discharge status: Home / Self Care | Payer: 59 | Attending: Internal Medicine | Admitting: Internal Medicine

## 2023-01-09 ENCOUNTER — Emergency Department (HOSPITAL_COMMUNITY): Payer: 59

## 2023-01-09 DIAGNOSIS — E119 Type 2 diabetes mellitus without complications: Secondary | ICD-10-CM

## 2023-01-09 DIAGNOSIS — E1122 Type 2 diabetes mellitus with diabetic chronic kidney disease: Secondary | ICD-10-CM | POA: Insufficient documentation

## 2023-01-09 DIAGNOSIS — Z992 Dependence on renal dialysis: Secondary | ICD-10-CM | POA: Insufficient documentation

## 2023-01-09 DIAGNOSIS — D631 Anemia in chronic kidney disease: Secondary | ICD-10-CM | POA: Insufficient documentation

## 2023-01-09 DIAGNOSIS — K5792 Diverticulitis of intestine, part unspecified, without perforation or abscess without bleeding: Principal | ICD-10-CM | POA: Insufficient documentation

## 2023-01-09 DIAGNOSIS — K625 Hemorrhage of anus and rectum: Secondary | ICD-10-CM | POA: Diagnosis present

## 2023-01-09 DIAGNOSIS — N186 End stage renal disease: Secondary | ICD-10-CM | POA: Diagnosis not present

## 2023-01-09 DIAGNOSIS — D649 Anemia, unspecified: Secondary | ICD-10-CM | POA: Diagnosis present

## 2023-01-09 DIAGNOSIS — Z7982 Long term (current) use of aspirin: Secondary | ICD-10-CM | POA: Insufficient documentation

## 2023-01-09 DIAGNOSIS — K219 Gastro-esophageal reflux disease without esophagitis: Secondary | ICD-10-CM

## 2023-01-09 DIAGNOSIS — I10 Essential (primary) hypertension: Secondary | ICD-10-CM | POA: Diagnosis present

## 2023-01-09 DIAGNOSIS — Z8673 Personal history of transient ischemic attack (TIA), and cerebral infarction without residual deficits: Secondary | ICD-10-CM | POA: Insufficient documentation

## 2023-01-09 DIAGNOSIS — K922 Gastrointestinal hemorrhage, unspecified: Secondary | ICD-10-CM | POA: Diagnosis not present

## 2023-01-09 DIAGNOSIS — Z89421 Acquired absence of other right toe(s): Secondary | ICD-10-CM | POA: Insufficient documentation

## 2023-01-09 DIAGNOSIS — K5701 Diverticulitis of small intestine with perforation and abscess with bleeding: Secondary | ICD-10-CM | POA: Diagnosis not present

## 2023-01-09 DIAGNOSIS — Z794 Long term (current) use of insulin: Secondary | ICD-10-CM | POA: Diagnosis not present

## 2023-01-09 DIAGNOSIS — N250 Renal osteodystrophy: Secondary | ICD-10-CM | POA: Diagnosis not present

## 2023-01-09 DIAGNOSIS — K828 Other specified diseases of gallbladder: Secondary | ICD-10-CM | POA: Diagnosis not present

## 2023-01-09 DIAGNOSIS — I120 Hypertensive chronic kidney disease with stage 5 chronic kidney disease or end stage renal disease: Secondary | ICD-10-CM | POA: Insufficient documentation

## 2023-01-09 DIAGNOSIS — Z79899 Other long term (current) drug therapy: Secondary | ICD-10-CM | POA: Diagnosis not present

## 2023-01-09 DIAGNOSIS — K8020 Calculus of gallbladder without cholecystitis without obstruction: Secondary | ICD-10-CM | POA: Diagnosis not present

## 2023-01-09 LAB — CBG MONITORING, ED: Glucose-Capillary: 256 mg/dL — ABNORMAL HIGH (ref 70–99)

## 2023-01-09 LAB — HEMOGLOBIN AND HEMATOCRIT, BLOOD
HCT: 31.5 % — ABNORMAL LOW (ref 36.0–46.0)
HCT: 32.1 % — ABNORMAL LOW (ref 36.0–46.0)
Hemoglobin: 10 g/dL — ABNORMAL LOW (ref 12.0–15.0)
Hemoglobin: 10.4 g/dL — ABNORMAL LOW (ref 12.0–15.0)

## 2023-01-09 LAB — COMPREHENSIVE METABOLIC PANEL
ALT: 11 U/L (ref 0–44)
AST: 18 U/L (ref 15–41)
Albumin: 3.2 g/dL — ABNORMAL LOW (ref 3.5–5.0)
Alkaline Phosphatase: 71 U/L (ref 38–126)
Anion gap: 12 (ref 5–15)
BUN: 29 mg/dL — ABNORMAL HIGH (ref 8–23)
CO2: 27 mmol/L (ref 22–32)
Calcium: 8.2 mg/dL — ABNORMAL LOW (ref 8.9–10.3)
Chloride: 98 mmol/L (ref 98–111)
Creatinine, Ser: 6.39 mg/dL — ABNORMAL HIGH (ref 0.44–1.00)
GFR, Estimated: 6 mL/min — ABNORMAL LOW (ref >60)
Glucose, Bld: 182 mg/dL — ABNORMAL HIGH (ref 70–99)
Potassium: 3.2 mmol/L — ABNORMAL LOW (ref 3.5–5.1)
Sodium: 137 mmol/L (ref 135–145)
Total Bilirubin: 0.7 mg/dL (ref 0.3–1.2)
Total Protein: 7.6 g/dL (ref 6.5–8.1)

## 2023-01-09 LAB — CBC
HCT: 30.5 % — ABNORMAL LOW (ref 36.0–46.0)
Hemoglobin: 10 g/dL — ABNORMAL LOW (ref 12.0–15.0)
MCH: 29.9 pg (ref 26.0–34.0)
MCHC: 32.8 g/dL (ref 30.0–36.0)
MCV: 91 fL (ref 80.0–100.0)
Platelets: 151 10*3/uL (ref 150–400)
RBC: 3.35 MIL/uL — ABNORMAL LOW (ref 3.87–5.11)
RDW: 16.7 % — ABNORMAL HIGH (ref 11.5–15.5)
WBC: 12.1 10*3/uL — ABNORMAL HIGH (ref 4.0–10.5)
nRBC: 0 % (ref 0.0–0.2)

## 2023-01-09 LAB — PROTIME-INR
INR: 1.3 — ABNORMAL HIGH (ref 0.8–1.2)
Prothrombin Time: 16.3 seconds — ABNORMAL HIGH (ref 11.4–15.2)

## 2023-01-09 LAB — TYPE AND SCREEN
ABO/RH(D): O POS
Antibody Screen: NEGATIVE

## 2023-01-09 LAB — HEPATITIS B SURFACE ANTIGEN: Hepatitis B Surface Ag: NONREACTIVE

## 2023-01-09 MED ORDER — ACETAMINOPHEN 325 MG PO TABS: 650.0000 mg | ORAL_TABLET | Freq: Four times a day (QID) | ORAL | Status: DC | PRN

## 2023-01-09 MED ORDER — ONDANSETRON HCL 4 MG/2ML IJ SOLN: 4.0000 mg | Freq: Four times a day (QID) | INTRAMUSCULAR | Status: DC | PRN

## 2023-01-09 MED ORDER — PANTOPRAZOLE SODIUM 40 MG PO TBEC
40.0000 mg | DELAYED_RELEASE_TABLET | Freq: Every day | ORAL | Status: DC
  Administered 2023-01-10: 40 mg via ORAL
  Filled 2023-01-09: qty 1

## 2023-01-09 MED ORDER — CHLORHEXIDINE GLUCONATE CLOTH 2 % EX PADS
6.0000 | MEDICATED_PAD | Freq: Every day | CUTANEOUS | Status: DC
  Administered 2023-01-10: 6 via TOPICAL

## 2023-01-09 MED ORDER — INSULIN ASPART 100 UNIT/ML IJ SOLN
0.0000 [IU] | INTRAMUSCULAR | Status: DC
  Administered 2023-01-09: 5 [IU] via SUBCUTANEOUS
  Administered 2023-01-10 (×2): 2 [IU] via SUBCUTANEOUS
  Filled 2023-01-09: qty 1

## 2023-01-09 MED ORDER — ONDANSETRON HCL 4 MG PO TABS: 4.0000 mg | ORAL_TABLET | Freq: Four times a day (QID) | ORAL | Status: DC | PRN

## 2023-01-09 MED ORDER — RENA-VITE PO TABS
1.0000 | ORAL_TABLET | Freq: Every day | ORAL | Status: DC
  Administered 2023-01-09: 1 via ORAL
  Filled 2023-01-09: qty 1

## 2023-01-09 MED ORDER — ACETAMINOPHEN 650 MG RE SUPP: 650.0000 mg | Freq: Four times a day (QID) | RECTAL | Status: DC | PRN

## 2023-01-09 MED ORDER — ALBUTEROL SULFATE (2.5 MG/3ML) 0.083% IN NEBU: 3.0000 mL | INHALATION_SOLUTION | Freq: Four times a day (QID) | RESPIRATORY_TRACT | Status: DC | PRN

## 2023-01-09 MED ORDER — METRONIDAZOLE 500 MG/100ML IV SOLN
500.0000 mg | Freq: Once | INTRAVENOUS | Status: AC
  Administered 2023-01-09: 500 mg via INTRAVENOUS
  Filled 2023-01-09: qty 100

## 2023-01-09 MED ORDER — DARBEPOETIN ALFA 60 MCG/0.3ML IJ SOSY
60.0000 ug | PREFILLED_SYRINGE | INTRAMUSCULAR | Status: DC
  Administered 2023-01-10: 60 ug via SUBCUTANEOUS
  Filled 2023-01-09 (×2): qty 0.3

## 2023-01-09 MED ORDER — METRONIDAZOLE 500 MG/100ML IV SOLN
500.0000 mg | Freq: Two times a day (BID) | INTRAVENOUS | Status: DC
  Administered 2023-01-09 – 2023-01-10 (×2): 500 mg via INTRAVENOUS
  Filled 2023-01-09 (×2): qty 100

## 2023-01-09 MED ORDER — CALCITRIOL 0.25 MCG PO CAPS
1.0000 ug | ORAL_CAPSULE | ORAL | Status: DC
  Administered 2023-01-10: 1 ug via ORAL
  Filled 2023-01-09: qty 4

## 2023-01-09 MED ORDER — IOHEXOL 300 MG/ML  SOLN
80.0000 mL | Freq: Once | INTRAMUSCULAR | Status: AC | PRN
  Administered 2023-01-09: 80 mL via INTRAVENOUS

## 2023-01-09 MED ORDER — SEVELAMER CARBONATE 800 MG PO TABS
800.0000 mg | ORAL_TABLET | Freq: Two times a day (BID) | ORAL | Status: DC
  Administered 2023-01-09 – 2023-01-10 (×2): 800 mg via ORAL
  Filled 2023-01-09 (×2): qty 1

## 2023-01-09 MED ORDER — SODIUM CHLORIDE 0.9 % IV SOLN
2.0000 g | INTRAVENOUS | Status: DC
  Administered 2023-01-10: 2 g via INTRAVENOUS
  Filled 2023-01-09: qty 20

## 2023-01-09 MED ORDER — SODIUM CHLORIDE 0.9 % IV SOLN
2.0000 g | Freq: Once | INTRAVENOUS | Status: AC
  Administered 2023-01-09: 2 g via INTRAVENOUS
  Filled 2023-01-09: qty 20

## 2023-01-09 NOTE — H&P (Signed)
History and Physical    [name]:[id number] DOB: 11/07/1940 DOA: 01/09/2023  PCP: Mirna Mires, MD   Patient coming from: Home  Chief Complaint: Rectal bleed  HPI: [name] is a 82 y.o. female with medical history significant for ESRD on HD MWF, type 2 diabetes, GERD, hypertension, and prior CVA who presented to the ED after episode of rectal bleeding noted noted this morning prior to dialysis.  She is also complaining of some left-sided abdominal pain.  She denies any nausea or vomiting or fevers or chills.  She states that she otherwise takes her medications as prescribed.   ED Course: Vital signs are stable and patient is afebrile.  She is noted to have mild leukocytosis of 12,000 and potassium was 3.2 with hemoglobin of 10 which is stable at baseline.  Creatinine 6.39.  GI consulted who recommends serial hemoglobin monitoring as well as initiation of IV antibiotics in the setting of diverticular bleed.  Review of Systems: Reviewed as noted above, otherwise negative.  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   Dialysis patient (HCC)    M, W, F   Fistula    R arm   GERD (gastroesophageal reflux disease)    Hypertension    Renal disorder    Shortness of breath    Stroke [facility])    right side weakness    Past Surgical History:  Procedure Laterality Date   AMPUTATION Right 01/13/2020   Procedure: AMPUTATION RIGHT FOOT 1ST RAY;  Surgeon: Nadara Mustard, MD;  Location: [facility] OR;  Service: Orthopedics;  Laterality: Right;   AV FISTULA PLACEMENT Right 09/08/2013   Procedure: CREATION OF RIGHT BRACHIAL CEPHALIC ARTERIOVENOUS FISTULA ;  Surgeon: Pryor Ochoa, MD;  Location: [facility] OR;  Service: Vascular;  Laterality: Right;   BASCILIC VEIN TRANSPOSITION Right 01/26/2014   Procedure: Right Arm BASILIC VEIN TRANSPOSITION;   Surgeon: Pryor Ochoa, MD;  Location: [facility] OR;  Service: Vascular;  Laterality: Right;   BIOPSY  09/10/2021   Procedure: BIOPSY;  Surgeon: Dolores Frame, MD;  Location: [facility];  Service: Gastroenterology;;  gastric   BIOPSY  10/07/2021   Procedure: BIOPSY;  Surgeon: Lemar Lofty., MD;  Location: [facility]COPY;  Service: Gastroenterology;;   BIOPSY  04/17/2022   Procedure: BIOPSY;  Surgeon: Lemar Lofty., MD;  Location: [facility];  Service: Gastroenterology;;   CATARACT EXTRACTION W/PHACO  11/20/2011   Procedure: CATARACT EXTRACTION PHACO AND INTRAOCULAR LENS PLACEMENT (IOC);  Surgeon: Gemma Payor, MD;  Location: [facility] ORS;  Service: Ophthalmology;  Laterality: Right;  CDE 18.82   CATARACT EXTRACTION W/PHACO Left 11/18/2012   Procedure: CATARACT EXTRACTION PHACO AND INTRAOCULAR LENS PLACEMENT (IOC);  Surgeon: Gemma Payor, MD;  Location: [facility];  Service: Ophthalmology;  Laterality: Left;  CDE: 18.97   COLONOSCOPY N/A 02/09/2013   Procedure: COLONOSCOPY;  Surgeon: Malissa Hippo, MD;  [facility]: [facility];  Service: Endoscopy;  Laterality: N/A;  305-moved to 220 Ann to notify pt   COLONOSCOPY N/A 07/08/2018   Procedure: COLONOSCOPY;  Surgeon: Malissa Hippo, MD;  [facility]: [facility];  Service: Endoscopy;  Laterality: N/A;  930   ENDOSCOPIC MUCOSAL RESECTION N/A 10/07/2021   Procedure: ENDOSCOPIC MUCOSAL RESECTION;  Surgeon: Meridee Score Netty Starring., MD;  Location: [facility];  Service: Gastroenterology;  Laterality: N/A;   ESOPHAGOGASTRODUODENOSCOPY N/A 04/17/2022   Procedure: ESOPHAGOGASTRODUODENOSCOPY (EGD);  Surgeon: Lemar Lofty., MD;  Location: [facility];  Service: Gastroenterology;  Laterality: N/A;   ESOPHAGOGASTRODUODENOSCOPY (EGD) WITH PROPOFOL N/A 09/10/2021   Procedure: ESOPHAGOGASTRODUODENOSCOPY (EGD) WITH PROPOFOL;  Surgeon: Dolores Frame, MD;  [facility]: [facility];  Service: Gastroenterology;  Laterality: N/A;    ESOPHAGOGASTRODUODENOSCOPY (EGD) WITH PROPOFOL N/A 10/07/2021   Procedure: ESOPHAGOGASTRODUODENOSCOPY (EGD) WITH PROPOFOL;  Surgeon: Meridee Score Netty Starring., MD;  Location: [facility];  Service: Gastroenterology;  Laterality: N/A;   EUS N/A 10/07/2021   Procedure: UPPER ENDOSCOPIC ULTRASOUND (EUS) RADIAL;  Surgeon: Lemar Lofty., MD;  Location: [facility];  Service: Gastroenterology;  Laterality: N/A;   GASTRIC VARICES BANDING  10/07/2021   Procedure: duodenal tumor BANDING;  Surgeon: Meridee Score Netty Starring., MD;  Location: [facility];  Service: Gastroenterology;;  with emr kit   HEMOSTASIS CLIP PLACEMENT  10/07/2021   Procedure: HEMOSTASIS CLIP PLACEMENT;  Surgeon: Lemar Lofty., MD;  Location: [facility];  Service: Gastroenterology;;   INSERTION OF DIALYSIS CATHETER Right 06/24/2013   Procedure: INSERTION OF DIALYSIS CATHETER: Ultrasound guided;  Surgeon: Nada Libman, MD;  Location: [facility] OR;  Service: Vascular;  Laterality: Right;   LIGATION OF ARTERIOVENOUS  FISTULA Right 01/26/2014   Procedure: LIGATION OF ARTERIOVENOUS  FISTULA;  Surgeon: Pryor Ochoa, MD;  Location: [facility] OR;  Service: Vascular;  Laterality: Right;   LOOP RECORDER IMPLANT N/A 07/21/2013   Procedure: LOOP RECORDER IMPLANT;  Surgeon: Marinus Maw, MD;  Location: [facility] CATH LAB;  Service: Cardiovascular;  Laterality: N/A;   POLYPECTOMY  07/08/2018   Procedure: POLYPECTOMY;  Surgeon: Malissa Hippo, MD;  [facility]: [facility];  Service: Endoscopy;;  Descending colon polyps x 2    SUBMUCOSAL LIFTING INJECTION  10/07/2021   Procedure: SUBMUCOSAL LIFTING INJECTION;  Surgeon: Lemar Lofty., MD;  Location: [facility];  Service: Gastroenterology;;   TEE WITHOUT CARDIOVERSION N/A 07/21/2013   Procedure: TRANSESOPHAGEAL ECHOCARDIOGRAM (TEE);  Surgeon: Lars Masson, MD;  Location: [facility] ENDOSCOPY;  Service: Cardiovascular;  Laterality: N/A;   UPPER ESOPHAGEAL ENDOSCOPIC ULTRASOUND (EUS) N/A 04/17/2022    Procedure: UPPER ESOPHAGEAL ENDOSCOPIC ULTRASOUND (EUS);  Surgeon: Lemar Lofty., MD;  Location: [facility];  Service: Gastroenterology;  Laterality: N/A;     reports that she has never smoked. She has never been exposed to tobacco smoke. She has never used smokeless tobacco. She reports that she does not drink alcohol and does not use drugs.  Allergies  Allergen Reactions   Ambien [Zolpidem] Other (See Comments)    Hallucinations    Reglan [Metoclopramide] Other (See Comments)    "makes me crazy"    Family History  Problem Relation Age of Onset   Cancer Sister    Cancer Brother    Anesthesia problems Neg Hx    Hypotension Neg Hx    Malignant hyperthermia Neg Hx    Pseudochol deficiency Neg Hx     Prior to Admission medications   Medication Sig Start Date End Date Taking? Authorizing Provider  albuterol (VENTOLIN HFA) 108 (90 Base) MCG/ACT inhaler Inhale 2 puffs into the lungs every 6 (six) hours as needed for wheezing or shortness of breath. 11/21/20   Sharee Holster, NP  aspirin 325 MG tablet Take 650 mg by mouth daily. 07/09/18   Malissa Hippo, MD  BD INSULIN SYRINGE U/F 31G X 5/16" 0.3 ML MISC Inject 1 each into the skin 2 (two) times  daily. as directed 11/21/20   Sharee Holster, NP  cyanocobalamin (,VITAMIN B-12,) 1000 MCG/ML injection Inject 1,000 mcg into the muscle every 30 (thirty) days. 11/01/14   [provider]  diclofenac Sodium (VOLTAREN) 1 % GEL Apply 4 g topically 4 (four) times daily as needed. Apply to bilateral knees 03/16/21   Bing Neighbors, NP  DULoxetine (CYMBALTA) 20 MG capsule Take 1 capsule (20 mg total) by mouth daily. Patient not taking: Reported on 12/29/2022 12/09/22   Adonis Huguenin, NP  insulin aspart protamine- aspart (NOVOLOG MIX 70/30) (70-30) 100 UNIT/ML injection Inject 20 units with breakfast and 10 units with supper if pre-meal glucose readings are above 90 ad she is eating 08/07/22   Roma Kayser, MD  multivitamin  (RENA-VIT) TABS tablet Take 1 tablet by mouth at bedtime. 08/21/14   Kirsteins, Victorino Sparrow, MD  omeprazole (PRILOSEC) 40 MG capsule TAKE ONE CAPSULE BY MOUTH TWICE DAILY BEFORE A meal Patient taking differently: Take 40 mg by mouth in the morning and at bedtime. 04/16/22   Mansouraty, Netty Starring., MD  sevelamer carbonate (RENVELA) 800 MG tablet Take 1 tablet (800 mg total) by mouth 2 (two) times daily before lunch and supper. 11/21/20   Sharee Holster, NP    Physical Exam: Vitals:   01/09/23 0945 01/09/23 1009 01/09/23 1015 01/09/23 1017  BP: (!) 169/76     Pulse: 90     Resp: 17     Temp:  97.8 F (36.6 C)    TempSrc:  Oral    SpO2: 97%   96%  Weight:   74 kg   Height:   5\' 7"  (1.702 m)     Constitutional: NAD, calm, comfortable Vitals:   01/09/23 0945 01/09/23 1009 01/09/23 1015 01/09/23 1017  BP: (!) 169/76     Pulse: 90     Resp: 17     Temp:  97.8 F (36.6 C)    TempSrc:  Oral    SpO2: 97%   96%  Weight:   74 kg   Height:   5\' 7"  (1.702 m)    Eyes: lids and conjunctivae normal Neck: normal, supple Respiratory: clear to auscultation bilaterally. Normal respiratory effort. No accessory muscle use.  Cardiovascular: Regular rate and rhythm, no murmurs. Abdomen: no tenderness, no distention. Bowel sounds positive.  Musculoskeletal:  No edema. Skin: no rashes, lesions, ulcers.  Psychiatric: Flat affect  Labs on Admission: I have personally reviewed following labs and imaging studies  CBC: Recent Labs  Lab 01/09/23 0614  WBC 12.1*  HGB 10.0*  HCT 30.5*  MCV 91.0  PLT 151   Basic Metabolic Panel: Recent Labs  Lab 01/09/23 0614  NA 137  K 3.2*  CL 98  CO2 27  GLUCOSE 182*  BUN 29*  CREATININE 6.39*  CALCIUM 8.2*   GFR: Estimated Creatinine Clearance: 7 mL/min (A) (by C-G formula based on SCr of 6.39 mg/dL (H)). Liver Function Tests: Recent Labs  Lab 01/09/23 0614  AST 18  ALT 11  ALKPHOS 71  BILITOT 0.7  PROT 7.6  ALBUMIN 3.2*   No results for  input(s): "LIPASE", "AMYLASE" in the last 168 hours. No results for input(s): "AMMONIA" in the last 168 hours. Coagulation Profile: Recent Labs  Lab 01/09/23 0715  INR 1.3*   Cardiac Enzymes: No results for input(s): "CKTOTAL", "CKMB", "CKMBINDEX", "TROPONINI" in the last 168 hours. BNP (last 3 results) No results for input(s): "PROBNP" in the last 8760 hours. HbA1C: No results  for input(s): "HGBA1C" in the last 72 hours. CBG: No results for input(s): "GLUCAP" in the last 168 hours. Lipid Profile: No results for input(s): "CHOL", "HDL", "LDLCALC", "TRIG", "CHOLHDL", "LDLDIRECT" in the last 72 hours. Thyroid Function Tests: No results for input(s): "TSH", "T4TOTAL", "FREET4", "T3FREE", "THYROIDAB" in the last 72 hours. Anemia Panel: No results for input(s): "VITAMINB12", "FOLATE", "FERRITIN", "TIBC", "IRON", "RETICCTPCT" in the last 72 hours. Urine analysis:    Component Value Date/Time   COLORURINE YELLOW 10/15/2017 2244   APPEARANCEUR HAZY (A) 10/15/2017 2244   LABSPEC 1.015 10/15/2017 2244   PHURINE 7.0 10/15/2017 2244   GLUCOSEU >=500 (A) 10/15/2017 2244   HGBUR SMALL (A) 10/15/2017 2244   BILIRUBINUR NEGATIVE 10/15/2017 2244   KETONESUR NEGATIVE 10/15/2017 2244   PROTEINUR 100 (A) 10/15/2017 2244   UROBILINOGEN 0.2 11/19/2014 0909   NITRITE NEGATIVE 10/15/2017 2244   LEUKOCYTESUR MODERATE (A) 10/15/2017 2244    Radiological Exams on Admission: CT ABDOMEN PELVIS W CONTRAST  Result Date: 01/09/2023 CLINICAL DATA:  LLQ abdominal pain rectal bleeding. EXAM: CT ABDOMEN AND PELVIS WITH CONTRAST TECHNIQUE: Multidetector CT imaging of the abdomen and pelvis was performed using the standard protocol following bolus administration of intravenous contrast. RADIATION DOSE REDUCTION: This exam was performed according to the departmental dose-optimization program which includes automated exposure control, adjustment of the mA and/or kV according to patient size and/or use of iterative  reconstruction technique. CONTRAST:  80mL OMNIPAQUE IOHEXOL 300 MG/ML  SOLN COMPARISON:  CT abdomen/pelvis 11/21/2021. FINDINGS: Lower chest: Small bilateral pleural effusions with adjacent atelectasis in the lung bases. Coronary artery calcifications. Mild cardiomegaly. Hepatobiliary: No focal liver lesion. Dependent gallstones. Mild gallbladder wall thickening. No pericholecystic fluid or fat stranding. No biliary dilatation. Pancreas: Unremarkable. No pancreatic ductal dilatation or surrounding inflammatory changes. Spleen: Normal in size without focal abnormality. Adrenals/Urinary Tract: Adrenal glands are unremarkable. Unchanged bilateral renal atrophy. No mass or hydronephrosis. Bladder is decompressed. Stomach/Bowel: Normal stomach and duodenum. No dilated loops of small bowel. Normal appendix is visualized on axial image 63 series 2. Extensive colonic diverticulosis colon is decompressed. Mild wall thickening and free fluid in the pelvis, suggestive of possible acute diverticulitis. Vascular/Lymphatic: Aortic atherosclerosis. No enlarged abdominal or pelvic lymph nodes. Reproductive: Uterus and bilateral adnexa are unremarkable. Other: No abdominal wall hernia. Musculoskeletal: Multilevel lumbar spondylosis, worst at L4-5, where there is severe spinal canal stenosis. IMPRESSION: 1. Extensive colonic diverticulosis with mild wall thickening and free fluid in the pelvis, suggestive of possible acute diverticulitis. No evidence of perforation or abscess. 2. Cholelithiasis with mild gallbladder wall thickening. No pericholecystic fluid or fat stranding. If there is clinical concern for acute cholecystitis, consider further evaluation with right upper quadrant ultrasound. 3. Small bilateral pleural effusions with adjacent atelectasis in the lung bases. 4. Multilevel lumbar spondylosis, worst at L4-5, where there is severe spinal canal stenosis. Aortic Atherosclerosis (ICD10-I70.0). Electronically Signed   By:  Orvan Falconer M.D.   On: 01/09/2023 09:41    EKG: Independently reviewed. SR 74bpm.  Assessment/Plan Principal Problem:   Rectal bleed Active Problems:   Essential hypertension   Anemia   ESRD (end stage renal disease) on dialysis (HCC)   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 disease (HCC)   DMII (diabetes mellitus, type 2) (HCC)   GERD (gastroesophageal reflux disease)    Rectal bleed likely diverticular -CT with noted diverticulosis and no other acute findings -Without significant drop in hemoglobin levels -GI recommending serial monitoring of hemoglobin and hematocrit as well as maintenance on Rocephin  and Flagyl -Appreciate further recommendations pending repeat testing  ESRD on HD MWF with mild hypokalemia -Appreciate nephrology for hemodialysis  Type 2 diabetes -Carb modified diet/SSI  GERD -Continue PPI  Hypertension -Continue home medications  History of prior CVA -Hold home aspirin for now  DVT prophylaxis: SCDs Code Status: Full Family Communication: Daughter at bedside 7/26 Disposition Plan: Admit for rectal bleeding evaluation Consults called: Nephrology, GI Admission status: Observation, MedSurg  Severity of Illness: The appropriate patient status for this patient is OBSERVATION. Observation status is judged to be reasonable and necessary in order to provide the required intensity of service to ensure the patient's safety. The patient's presenting symptoms, physical exam findings, and initial radiographic and laboratory data in the context of their medical condition is felt to place them at decreased risk for further clinical deterioration. Furthermore, it is anticipated that the patient will be medically stable for discharge from the hospital within 2 midnights of admission.    Nevan Creighton D Sherryll Burger DO [facility]  If 7PM-7AM, please contact night-coverage [email or web address]  01/09/2023, 10:36 AM

## 2023-01-09 NOTE — ED Notes (Signed)
ED TO INPATIENT HANDOFF REPORT  ED Nurse Name and Phone #: Milagros Loll [phone]  S Name/Age/Gender [name] 82 y.o. female Room/Bed: APA09/APA09  Code Status   Code Status: Full Code  Home/SNF/Other Home Patient oriented to: situation Is this baseline? Yes   Triage Complete: Triage complete  Chief Complaint Rectal bleed [K62.5]  Triage Note Pt c/o rectal bleed when she got up this morning to go to the bathroom. Pt c/o lower back pain. Denies any abdominal pain.    Allergies Allergies  Allergen Reactions   Ambien [Zolpidem] Other (See Comments)    Hallucinations    Reglan [Metoclopramide] Other (See Comments)    "makes me crazy"    Level of Care/Admitting Diagnosis ED Disposition     ED Disposition  Admit   Condition  --   Comment  Hospital Area: [facility] [[id number]]  Level of Care: Med-Surg [16]  Covid Evaluation: Asymptomatic - no recent exposure (last 10 days) testing not required  Diagnosis: Rectal bleed [960454]  Admitting Physician: Erick Blinks [[id number]]  Attending Physician: Erick Blinks [[id number]]          B Medical/Surgery History 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   Dialysis patient (HCC)    M, W, F   Fistula    R arm   GERD (gastroesophageal reflux disease)    Hypertension    Renal disorder    Shortness of breath    Stroke [facility])    right side weakness   Past Surgical History:  Procedure Laterality Date   AMPUTATION Right 01/13/2020   Procedure: AMPUTATION RIGHT FOOT 1ST RAY;  Surgeon: Nadara Mustard, MD;  Location: [facility] OR;  Service: Orthopedics;  Laterality: Right;   AV FISTULA PLACEMENT Right 09/08/2013   Procedure: CREATION OF RIGHT BRACHIAL CEPHALIC ARTERIOVENOUS FISTULA ;  Surgeon: Pryor Ochoa, MD;  Location: [facility] OR;  Service: Vascular;  Laterality: Right;    BASCILIC VEIN TRANSPOSITION Right 01/26/2014   Procedure: Right Arm BASILIC VEIN TRANSPOSITION;  Surgeon: Pryor Ochoa, MD;  Location: [facility] OR;  Service: Vascular;  Laterality: Right;   BIOPSY  09/10/2021   Procedure: BIOPSY;  Surgeon: Dolores Frame, MD;  [facility];  Service: Gastroenterology;;  gastric   BIOPSY  10/07/2021   Procedure: BIOPSY;  Surgeon: Lemar Lofty., MD;  Location: [facility];  Service: Gastroenterology;;   BIOPSY  04/17/2022   Procedure: BIOPSY;  Surgeon: Lemar Lofty., MD;  Location: [facility];  Service: Gastroenterology;;   CATARACT EXTRACTION W/PHACO  11/20/2011   Procedure: CATARACT EXTRACTION PHACO AND INTRAOCULAR LENS PLACEMENT (IOC);  Surgeon: Gemma Payor, MD;  Location: [facility] ORS;  Service: Ophthalmology;  Laterality: Right;  CDE 18.82   CATARACT EXTRACTION W/PHACO Left 11/18/2012   Procedure: CATARACT EXTRACTION PHACO AND INTRAOCULAR LENS PLACEMENT (IOC);  Surgeon: Gemma Payor, MD;  Location: [facility] OR[facility];  Service: Ophthalmology;  Laterality: Left;  CDE: 18.97   COLONOSCOPY N/A 02/09/2013   Procedure: COLONOSCOPY;  Surgeon: Malissa Hippo, MD;  [facility];  Service: Endoscopy;  Laterality: N/A;  305-moved to 220 Ann to notify pt   COLONOSCOPY N/A 07/08/2018   Procedure: COLONOSCOPY;  Surgeon: Malissa Hippo, MD;  [facility];  Service: Endoscopy;  Laterality: N/A;  930  ENDOSCOPIC MUCOSAL RESECTION N/A 10/07/2021   Procedure: ENDOSCOPIC MUCOSAL RESECTION;  Surgeon: Meridee Score Netty Starring., MD;  Location: [facility];  Service: Gastroenterology;  Laterality: N/A;   ESOPHAGOGASTRODUODENOSCOPY N/A 04/17/2022   Procedure: ESOPHAGOGASTRODUODENOSCOPY (EGD);  Surgeon: Lemar Lofty., MD;  Location: [facility];  Service: Gastroenterology;  Laterality: N/A;   ESOPHAGOGASTRODUODENOSCOPY (EGD) WITH PROPOFOL N/A 09/10/2021   Procedure: ESOPHAGOGASTRODUODENOSCOPY (EGD) WITH PROPOFOL;  Surgeon: Dolores Frame, MD;  [facility];  Service: Gastroenterology;  Laterality: N/A;   ESOPHAGOGASTRODUODENOSCOPY (EGD) WITH PROPOFOL N/A 10/07/2021   Procedure: ESOPHAGOGASTRODUODENOSCOPY (EGD) WITH PROPOFOL;  Surgeon: Meridee Score Netty Starring., MD;  Location: [facility] ENDOSCOPY;  Service: Gastroenterology;  Laterality: N/A;   EUS N/A 10/07/2021   Procedure: UPPER ENDOSCOPIC ULTRASOUND (EUS) RADIAL;  Surgeon: Lemar Lofty., MD;  Location: [facility] ENDOSCOPY;  Service: Gastroenterology;  Laterality: N/A;   GASTRIC VARICES BANDING  10/07/2021   Procedure: duodenal tumor BANDING;  Surgeon: Meridee Score Netty Starring., MD;  Location: [facility];  Service: Gastroenterology;;  with emr kit   HEMOSTASIS CLIP PLACEMENT  10/07/2021   Procedure: HEMOSTASIS CLIP PLACEMENT;  Surgeon: Lemar Lofty., MD;  Location: [facility];  Service: Gastroenterology;;   INSERTION OF DIALYSIS CATHETER Right 06/24/2013   Procedure: INSERTION OF DIALYSIS CATHETER: Ultrasound guided;  Surgeon: Nada Libman, MD;  Location: [facility] OR;  Service: Vascular;  Laterality: Right;   LIGATION OF ARTERIOVENOUS  FISTULA Right 01/26/2014   Procedure: LIGATION OF ARTERIOVENOUS  FISTULA;  Surgeon: Pryor Ochoa, MD;  Location: [facility] OR;  Service: Vascular;  Laterality: Right;   LOOP RECORDER IMPLANT N/A 07/21/2013   Procedure: LOOP RECORDER IMPLANT;  Surgeon: Marinus Maw, MD;  Location: [facility] CATH LAB;  Service: Cardiovascular;  Laterality: N/A;   POLYPECTOMY  07/08/2018   Procedure: POLYPECTOMY;  Surgeon: Malissa Hippo, MD;  [facility];  Service: Endoscopy;;  Descending colon polyps x 2    SUBMUCOSAL LIFTING INJECTION  10/07/2021   Procedure: SUBMUCOSAL LIFTING INJECTION;  Surgeon: Lemar Lofty., MD;  Location: [facility];  Service: Gastroenterology;;   TEE WITHOUT CARDIOVERSION N/A 07/21/2013   Procedure: TRANSESOPHAGEAL ECHOCARDIOGRAM (TEE);  Surgeon: Lars Masson, MD;  Location: [facility] ENDOSCOPY;  Service:  Cardiovascular;  Laterality: N/A;   UPPER ESOPHAGEAL ENDOSCOPIC ULTRASOUND (EUS) N/A 04/17/2022   Procedure: UPPER ESOPHAGEAL ENDOSCOPIC ULTRASOUND (EUS);  Surgeon: Lemar Lofty., MD;  Location: [facility];  Service: Gastroenterology;  Laterality: N/A;     A IV Location/Drains/Wounds Patient Lines/Drains/Airways Status     Active Line/Drains/Airways     Name Placement date Placement time Site Days   Peripheral IV 01/09/23 20 G Left;Anterior;Medial Forearm 01/09/23  0849  Forearm  less than 1   Fistula / Graft Right Upper arm Arteriovenous fistula 01/26/14  0810  Upper arm  3270   Wound / Incision (Open or Dehisced) 01/06/20 Non-pressure wound Toe (Comment  which one) Right;Posterior 01/06/20  1730  Toe (Comment  which one)  1099            Intake/Output Last 24 hours  Intake/Output Summary (Last 24 hours) at 01/09/2023 1935 Last data filed at 01/09/2023 1057 Gross per 24 hour  Intake 100 ml  Output --  Net 100 ml    Labs/Imaging Results for orders placed or performed during the hospital encounter of 01/09/23 (from the past 48 hour(s))  Comprehensive metabolic panel     Status: Abnormal   Collection Time: 01/09/23  6:14 AM  Result Value Ref Range   Sodium  137 135 - 145 mmol/L   Potassium 3.2 (L) 3.5 - 5.1 mmol/L   Chloride 98 98 - 111 mmol/L   CO2 27 22 - 32 mmol/L   Glucose, Bld 182 (H) 70 - 99 mg/dL    Comment: Glucose reference range applies only to samples taken after fasting for at least 8 hours.   BUN 29 (H) 8 - 23 mg/dL   Creatinine, Ser 6.57 (H) 0.44 - 1.00 mg/dL   Calcium 8.2 (L) 8.9 - 10.3 mg/dL   Total Protein 7.6 6.5 - 8.1 g/dL   Albumin 3.2 (L) 3.5 - 5.0 g/dL   AST 18 15 - 41 U/L   ALT 11 0 - 44 U/L   Alkaline Phosphatase 71 38 - 126 U/L   Total Bilirubin 0.7 0.3 - 1.2 mg/dL   GFR, Estimated 6 (L) >60 mL/min    Comment: (NOTE) Calculated using the CKD-EPI Creatinine Equation (2021)    Anion gap 12 5 - 15    Comment: Performed at [facility], [address], [place], [place] [address]  CBC     Status: Abnormal   Collection Time: 01/09/23  6:14 AM  Result Value Ref Range   WBC 12.1 (H) 4.0 - 10.5 K/uL   RBC 3.35 (L) 3.87 - 5.11 MIL/uL   Hemoglobin 10.0 (L) 12.0 - 15.0 g/dL   HCT 29.5 (L) 28.4 - 13.2 %   MCV 91.0 80.0 - 100.0 fL   MCH 29.9 26.0 - 34.0 pg   MCHC 32.8 30.0 - 36.0 g/dL   RDW 44.0 (H) 10.2 - 72.5 %   Platelets 151 150 - 400 K/uL   nRBC 0.0 0.0 - 0.2 %    Comment: Performed at [facility], [address] [place], [place] [address]  Type and screen [facility]     Status: None   Collection Time: 01/09/23  6:14 AM  Result Value Ref Range   ABO/RH(D) O POS    Antibody Screen NEG    Sample Expiration      01/12/2023,2359 Performed at [facility], [address], [place], [place] [address]   Protime-INR     Status: Abnormal   Collection Time: 01/09/23  7:15 AM  Result Value Ref Range   Prothrombin Time 16.3 (H) 11.4 - 15.2 seconds   INR 1.3 (H) 0.8 - 1.2    Comment: (NOTE) INR goal varies based on device and disease states. Performed at [facility], [address], [place], [place] [address]   Hemoglobin and hematocrit, blood     Status: Abnormal   Collection Time: 01/09/23 11:56 AM  Result Value Ref Range   Hemoglobin 10.0 (L) 12.0 - 15.0 g/dL   HCT 38.7 (L) 56.4 - 33.2 %    Comment: Performed at [facility], [address] [place], [place] [address]  Hepatitis B surface antigen     Status: None   Collection Time: 01/09/23 12:48 PM  Result Value Ref Range   Hepatitis B Surface Ag NON REACTIVE NON REACTIVE    Comment: Performed at [facility], [address], [place], [place] [address]  CBG monitoring, ED     Status: Abnormal   Collection Time: 01/09/23  4:10 PM  Result Value Ref Range   Glucose-Capillary 256 (H) 70 - 99 mg/dL    Comment: Glucose reference range applies only to samples taken after fasting for at least 8 hours.  Hemoglobin and hematocrit, blood     Status: Abnormal  Collection Time: 01/09/23  5:54 PM  Result Value Ref Range   Hemoglobin 10.4 (L) 12.0 - 15.0 g/dL   HCT 16.1 (L) 09.6 - 04.5 %    Comment: Performed at [facility], [address] [place], [place] [address]   CT ABDOMEN PELVIS W CONTRAST  Result Date: 01/09/2023 CLINICAL DATA:  LLQ abdominal pain rectal bleeding. EXAM: CT ABDOMEN AND PELVIS WITH CONTRAST TECHNIQUE: Multidetector CT imaging of the abdomen and pelvis was performed using the standard protocol following bolus administration of intravenous contrast. RADIATION DOSE REDUCTION: This exam was performed according to the departmental dose-optimization program which includes automated exposure control, adjustment of the mA and/or kV according to patient size and/or use of iterative reconstruction technique. CONTRAST:  80mL OMNIPAQUE IOHEXOL 300 MG/ML  SOLN COMPARISON:  CT abdomen/pelvis 11/21/2021. FINDINGS: Lower chest: Small bilateral pleural effusions with adjacent atelectasis in the lung bases. Coronary artery calcifications. Mild cardiomegaly. Hepatobiliary: No focal liver lesion. Dependent gallstones. Mild gallbladder wall thickening. No pericholecystic fluid or fat stranding. No biliary dilatation. Pancreas: Unremarkable. No pancreatic ductal dilatation or surrounding inflammatory changes. Spleen: Normal in size without focal abnormality. Adrenals/Urinary Tract: Adrenal glands are unremarkable. Unchanged bilateral renal atrophy. No mass or hydronephrosis. Bladder is decompressed. Stomach/Bowel: Normal stomach and duodenum. No dilated loops of small bowel. Normal appendix is visualized on axial image 63 series 2. Extensive colonic diverticulosis colon is decompressed. Mild wall thickening and free fluid in the pelvis, suggestive of possible acute diverticulitis. Vascular/Lymphatic: Aortic atherosclerosis. No enlarged abdominal or pelvic lymph nodes. Reproductive: Uterus and bilateral adnexa are unremarkable. Other: No abdominal wall hernia.  Musculoskeletal: Multilevel lumbar spondylosis, worst at L4-5, where there is severe spinal canal stenosis. IMPRESSION: 1. Extensive colonic diverticulosis with mild wall thickening and free fluid in the pelvis, suggestive of possible acute diverticulitis. No evidence of perforation or abscess. 2. Cholelithiasis with mild gallbladder wall thickening. No pericholecystic fluid or fat stranding. If there is clinical concern for acute cholecystitis, consider further evaluation with right upper quadrant ultrasound. 3. Small bilateral pleural effusions with adjacent atelectasis in the lung bases. 4. Multilevel lumbar spondylosis, worst at L4-5, where there is severe spinal canal stenosis. Aortic Atherosclerosis (ICD10-I70.0). Electronically Signed   By: Orvan Falconer M.D.   On: 01/09/2023 09:41    Pending Labs Unresulted Labs (From admission, onward)     Start     Ordered   01/10/23 0500  Magnesium  Tomorrow morning,   R        01/09/23 1050   01/10/23 0500  CBC  Tomorrow morning,   R        01/09/23 1050   01/10/23 0500  Basic metabolic panel  Tomorrow morning,   R        01/09/23 1050   01/09/23 1237  Hepatitis B surface antibody,quantitative  (New Admission Hemo Labs (Hepatitis B))  Once,   R        01/09/23 1238   01/09/23 1200  Hemoglobin and hematocrit, blood  Now then every 6 hours,   R      01/09/23 1051   01/09/23 1051  Hemoglobin A1c  Once,   R       Comments: To assess prior glycemic control    01/09/23 1051   Signed and Held  Renal function panel  Once,   R        Signed and Held   Signed and Held  CBC  Once,   R  Signed and Held            Vitals/Pain Today's Vitals   01/09/23 1030 01/09/23 1045 01/09/23 1230 01/09/23 1607  BP: (!) 164/70 (!) 167/77 (!) 155/68 (!) 152/68  Pulse: 79 82 80 80  Resp: 18 19 16 18   Temp:      TempSrc:      SpO2: 96% 95% 97% 94%  Weight:      Height:      PainSc:        Isolation Precautions No active  isolations  Medications Medications  sevelamer carbonate (RENVELA) tablet 800 mg (800 mg Oral Given 01/09/23 1809)  multivitamin (RENA-VIT) tablet 1 tablet (has no administration in time range)  albuterol (PROVENTIL) (2.5 MG/3ML) 0.083% nebulizer solution 3 mL (has no administration in time range)  pantoprazole (PROTONIX) EC tablet 40 mg (40 mg Oral Not Given 01/09/23 1317)  acetaminophen (TYLENOL) tablet 650 mg (has no administration in time range)    Or  acetaminophen (TYLENOL) suppository 650 mg (has no administration in time range)  ondansetron (ZOFRAN) tablet 4 mg (has no administration in time range)    Or  ondansetron (ZOFRAN) injection 4 mg (has no administration in time range)  cefTRIAXone (ROCEPHIN) 2 g in sodium chloride 0.9 % 100 mL IVPB (has no administration in time range)  insulin aspart (novoLOG) injection 0-9 Units (5 Units Subcutaneous Given 01/09/23 1809)  Chlorhexidine Gluconate Cloth 2 % PADS 6 each (has no administration in time range)  calcitRIOL (ROCALTROL) capsule 1 mcg (has no administration in time range)  Darbepoetin Alfa (ARANESP) injection 60 mcg (has no administration in time range)  metroNIDAZOLE (FLAGYL) IVPB 500 mg (has no administration in time range)  iohexol (OMNIPAQUE) 300 MG/ML solution 80 mL (80 mLs Intravenous Contrast Given 01/09/23 0912)  cefTRIAXone (ROCEPHIN) 2 g in sodium chloride 0.9 % 100 mL IVPB (0 g Intravenous Stopped 01/09/23 1057)    And  metroNIDAZOLE (FLAGYL) IVPB 500 mg (0 mg Intravenous Stopped 01/09/23 1452)    Mobility non-ambulatory     Focused Assessments Pulmonary Assessment Handoff:  Lung sounds:          R Recommendations: See Admitting Provider Note  Report given to:   Additional Notes: N/A

## 2023-01-09 NOTE — Consult Note (Signed)
Reason for Consult: To manage dialysis and dialysis related needs Referring Physician: [name] is an 82 y.o. 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  Normally does not have any issues with HD   Dialyzes at [facility] MWF 4 hours EDW 72. HD Bath 2k, Dialyzer unknown, Heparin no. Access AVF 400 BFR. Calcitriol 1 mcg three times per week, mircera 50-  due 7/29  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   Dialysis patient (HCC)    M, W, F   Fistula    R arm   GERD (gastroesophageal reflux disease)    Hypertension    Renal disorder    Shortness of breath    Stroke [facility])    right side weakness    Past Surgical History:  Procedure Laterality Date   AMPUTATION Right 01/13/2020   Procedure: AMPUTATION RIGHT FOOT 1ST RAY;  Surgeon: Nadara Mustard, MD;  Location: [facility] OR;  Service: Orthopedics;  Laterality: Right;   AV FISTULA PLACEMENT Right 09/08/2013   Procedure: CREATION OF RIGHT BRACHIAL CEPHALIC ARTERIOVENOUS FISTULA ;  Surgeon: Pryor Ochoa, MD;  Location: [facility] OR;  Service: Vascular;  Laterality: Right;   BASCILIC VEIN TRANSPOSITION Right 01/26/2014   Procedure: Right Arm BASILIC VEIN TRANSPOSITION;  Surgeon: Pryor Ochoa, MD;  Location: [facility] OR;  Service: Vascular;  Laterality: Right;   BIOPSY  09/10/2021   Procedure: BIOPSY;  Surgeon: Dolores Frame, MD;  [facility]: [facility];  Service: Gastroenterology;;  gastric   BIOPSY  10/07/2021   Procedure: BIOPSY;  Surgeon: Lemar Lofty., MD;  Location: [facility]  ENDOSCOPY;  Service: Gastroenterology;;   BIOPSY  04/17/2022   Procedure: BIOPSY;  Surgeon: Lemar Lofty., MD;  Location: [facility] ENDOSCOPY;  Service: Gastroenterology;;   CATARACT EXTRACTION W/PHACO  11/20/2011   Procedure: CATARACT EXTRACTION PHACO AND INTRAOCULAR LENS PLACEMENT (IOC);  Surgeon: Gemma Payor, MD;  Location: [facility] ORS;  Service: Ophthalmology;  Laterality: Right;  CDE 18.82   CATARACT EXTRACTION W/PHACO Left 11/18/2012   Procedure: CATARACT EXTRACTION PHACO AND INTRAOCULAR LENS PLACEMENT (IOC);  Surgeon: Gemma Payor, MD;  Location: [facility] ORS;  Service: Ophthalmology;  Laterality: Left;  CDE: 18.97   COLONOSCOPY N/A 02/09/2013   Procedure: COLONOSCOPY;  Surgeon: Malissa Hippo, MD;  [facility];  Service: Endoscopy;  Laterality: N/A;  305-moved to 220 Ann to notify pt   COLONOSCOPY N/A 07/08/2018   Procedure: COLONOSCOPY;  Surgeon: Malissa Hippo, MD;  [facility];  Service: Endoscopy;  Laterality: N/A;  930   ENDOSCOPIC MUCOSAL RESECTION N/A 10/07/2021   Procedure: ENDOSCOPIC MUCOSAL RESECTION;  Surgeon: Meridee Score Netty Starring., MD;  Location: [facility] ENDOSCOPY;  Service: Gastroenterology;  Laterality: N/A;   ESOPHAGOGASTRODUODENOSCOPY N/A 04/17/2022   Procedure: ESOPHAGOGASTRODUODENOSCOPY (EGD);  Surgeon: Lemar Lofty., MD;  Location: [facility] ENDOSCOPY;  Service: Gastroenterology;  Laterality: N/A;   ESOPHAGOGASTRODUODENOSCOPY (EGD) WITH PROPOFOL N/A 09/10/2021   Procedure: ESOPHAGOGASTRODUODENOSCOPY (EGD) WITH PROPOFOL;  Surgeon:  Dolores Frame, MD;  [facility];  Service: Gastroenterology;  Laterality: N/A;   ESOPHAGOGASTRODUODENOSCOPY (EGD) WITH PROPOFOL N/A 10/07/2021   Procedure: ESOPHAGOGASTRODUODENOSCOPY (EGD) WITH PROPOFOL;  Surgeon: Meridee Score Netty Starring., MD;  Location: [facility] ENDOS[facility];  Service: Gastroenterology;  Laterality: N/A;   EUS N/A 10/07/2021   Procedure: UPPER ENDOSCOPIC ULTRASOUND (EUS) RADIAL;  Surgeon: Lemar Lofty., MD;  Location: [facility] END[facility];  Service: Gastroenterology;  Laterality: N/A;   GASTRIC VARICES BANDING  10/07/2021   Procedure: duodenal tumor BANDING;  Surgeon: Meridee Score Netty Starring., MD;  Location: [facility];  Service: Gastroenterology;;  with emr kit   HEMOSTASIS CLIP PLACEMENT  10/07/2021   Procedure: HEMOSTASIS CLIP PLACEMENT;  Surgeon: Lemar Lofty., MD;  Location: [facility];  Service: Gastroenterology;;   INSERTION OF DIALYSIS CATHETER Right 06/24/2013   Procedure: INSERTION OF DIALYSIS CATHETER: Ultrasound guided;  Surgeon: Nada Libman, MD;  Location: [facility] OR;  Service: Vascular;  Laterality: Right;   LIGATION OF ARTERIOVENOUS  FISTULA Right 01/26/2014   Procedure: LIGATION OF ARTERIOVENOUS  FISTULA;  Surgeon: Pryor Ochoa, MD;  Location: [facility] OR;  Service: Vascular;  Laterality: Right;   LOOP RECORDER IMPLANT N/A 07/21/2013   Procedure: LOOP RECORDER IMPLANT;  Surgeon: Marinus Maw, MD;  Location: [facility] CATH LAB;  Service: Cardiovascular;  Laterality: N/A;   POLYPECTOMY  07/08/2018   Procedure: POLYPECTOMY;  Surgeon: Malissa Hippo, MD;  [facility];  Service: Endoscopy;;  Descending colon polyps x 2    SUBMUCOSAL LIFTING INJECTION  10/07/2021   Procedure: SUBMUCOSAL LIFTING INJECTION;  Surgeon: Lemar Lofty., MD;  Location: [facility];  Service: Gastroenterology;;   TEE WITHOUT CARDIOVERSION N/A 07/21/2013   Procedure: TRANSESOPHAGEAL ECHOCARDIOGRAM (TEE);  Surgeon: Lars Masson, MD;  Location: [facility] ENDOSCOPY;  Service: Cardiovascular;  Laterality: N/A;   UPPER ESOPHAGEAL ENDOSCOPIC ULTRASOUND (EUS) N/A 04/17/2022   Procedure: UPPER ESOPHAGEAL ENDOSCOPIC ULTRASOUND (EUS);  Surgeon: Lemar Lofty., MD;  Location: [facility] ENDOS[facility];  Service: Gastroenterology;  Laterality: N/A;    Family History  Problem Relation Age of Onset   Cancer Sister    Cancer Brother    Anesthesia problems Neg Hx    Hypotension Neg Hx    Malignant hyperthermia Neg  Hx    Pseudochol deficiency Neg Hx     Social History:  reports that she has never smoked. She has never been exposed to tobacco smoke. She has never used smokeless tobacco. She reports that she does not drink alcohol and does not use drugs.  Allergies:  Allergies  Allergen Reactions   Ambien [Zolpidem] Other (See Comments)    Hallucinations    Reglan [Metoclopramide] Other (See Comments)    "makes me crazy"    Medications: I have reviewed the patient's current medications.  Results for orders placed or performed during the hospital encounter of 01/09/23 (from the past 48 hour(s))  Comprehensive metabolic panel     Status: Abnormal   Collection Time: 01/09/23  6:14 AM  Result Value Ref Range   Sodium 137 135 - 145 mmol/L   Potassium 3.2 (L) 3.5 - 5.1 mmol/L   Chloride 98 98 - 111 mmol/L   CO2 27 22 - 32 mmol/L   Glucose, Bld 182 (H) 70 - 99 mg/dL    Comment: Glucose reference range applies only to samples taken after fasting for at least 8 hours.   BUN 29 (H) 8 - 23 mg/dL   Creatinine, Ser 5.78 (H) 0.44 - 1.00 mg/dL  Calcium 8.2 (L) 8.9 - 10.3 mg/dL   Total Protein 7.6 6.5 - 8.1 g/dL   Albumin 3.2 (L) 3.5 - 5.0 g/dL   AST 18 15 - 41 U/L   ALT 11 0 - 44 U/L   Alkaline Phosphatase 71 38 - 126 U/L   Total Bilirubin 0.7 0.3 - 1.2 mg/dL   GFR, Estimated 6 (L) >60 mL/min    Comment: (NOTE) Calculated using the CKD-EPI Creatinine Equation (2021)    Anion gap 12 5 - 15    Comment: Performed at [facility], [address], [place], [place] [address]  CBC     Status: Abnormal   Collection Time: 01/09/23  6:14 AM  Result Value Ref Range   WBC 12.1 (H) 4.0 - 10.5 K/uL   RBC 3.35 (L) 3.87 - 5.11 MIL/uL   Hemoglobin 10.0 (L) 12.0 - 15.0 g/dL   HCT 35.5 (L) 73.2 - 20.2 %   MCV 91.0 80.0 - 100.0 fL   MCH 29.9 26.0 - 34.0 pg   MCHC 32.8 30.0 - 36.0 g/dL   RDW 54.2 (H) 70.6 - 23.7 %   Platelets 151 150 - 400 K/uL   nRBC 0.0 0.0 - 0.2 %    Comment: Performed at [facility], [address], [place], [place] [address]  Type and screen [facility]     Status: None   Collection Time: 01/09/23  6:14 AM  Result Value Ref Range   ABO/RH(D) O POS    Antibody Screen NEG    Sample Expiration      01/12/2023,2359 Performed at [facility], [address], [place], [place] [address]   Protime-INR     Status: Abnormal   Collection Time: 01/09/23  7:15 AM  Result Value Ref Range   Prothrombin Time 16.3 (H) 11.4 - 15.2 seconds   INR 1.3 (H) 0.8 - 1.2    Comment: (NOTE) INR goal varies based on device and disease states. Performed at [facility], [address], [place], [place] [address]     CT ABDOMEN PELVIS W CONTRAST  Result Date: 01/09/2023 CLINICAL DATA:  LLQ abdominal pain rectal bleeding. EXAM: CT ABDOMEN AND PELVIS WITH CONTRAST TECHNIQUE: Multidetector CT imaging of the abdomen and pelvis was performed using the standard protocol following bolus administration of intravenous contrast. RADIATION DOSE REDUCTION: This exam was performed according to the departmental dose-optimization program which includes automated exposure control, adjustment of the mA and/or kV according to patient size and/or use of iterative reconstruction technique. CONTRAST:  80mL OMNIPAQUE IOHEXOL 300 MG/ML  SOLN COMPARISON:  CT abdomen/pelvis 11/21/2021. FINDINGS: Lower chest: Small bilateral pleural effusions with adjacent atelectasis in the lung bases. Coronary artery calcifications. Mild cardiomegaly. Hepatobiliary: No focal liver lesion. Dependent gallstones. Mild gallbladder wall thickening. No pericholecystic fluid or fat stranding. No biliary dilatation. Pancreas: Unremarkable. No pancreatic ductal dilatation or surrounding inflammatory changes. Spleen: Normal in size without focal abnormality. Adrenals/Urinary Tract: Adrenal glands are unremarkable. Unchanged bilateral renal atrophy. No mass or hydronephrosis. Bladder is decompressed. Stomach/Bowel: Normal stomach and duodenum.  No dilated loops of small bowel. Normal appendix is visualized on axial image 63 series 2. Extensive colonic diverticulosis colon is decompressed. Mild wall thickening and free fluid in the pelvis, suggestive of possible acute diverticulitis. Vascular/Lymphatic: Aortic atherosclerosis. No enlarged abdominal or pelvic lymph nodes. Reproductive: Uterus and bilateral adnexa are unremarkable. Other: No abdominal wall hernia. Musculoskeletal: Multilevel lumbar spondylosis, worst at L4-5, where there is severe spinal canal stenosis. IMPRESSION: 1. Extensive  colonic diverticulosis with mild wall thickening and free fluid in the pelvis, suggestive of possible acute diverticulitis. No evidence of perforation or abscess. 2. Cholelithiasis with mild gallbladder wall thickening. No pericholecystic fluid or fat stranding. If there is clinical concern for acute cholecystitis, consider further evaluation with right upper quadrant ultrasound. 3. Small bilateral pleural effusions with adjacent atelectasis in the lung bases. 4. Multilevel lumbar spondylosis, worst at L4-5, where there is severe spinal canal stenosis. Aortic Atherosclerosis (ICD10-I70.0). Electronically Signed   By: Orvan Falconer M.D.   On: 01/09/2023 09:41    ROS: positive for abdominal pain-  specifically LLQ pain- rectal bleeding-  also nausea Blood pressure (!) 169/76, pulse 90, temperature 97.8 F (36.6 C), temperature source Oral, resp. rate 17, SpO2 97%. General appearance: alert, cooperative, mild distress, and defers to her daughter for questions Resp: clear to auscultation bilaterally Cardio: regular rate and rhythm, S1, S2 normal, no murmur, click, rub or gallop GI: abnormal findings:  distended, hypoactive bowel sounds, and mild tenderness mostly n LLQ Extremities: extremities normal, atraumatic, no cyanosis or edema Right AVF-  patent  Assessment/Plan: 82 year old BF with ESRD presenting with diverticulitis and rectal bleeding  1  diverticulitis and rectal bleeding-  plan is for her to be admitted for iv abx and serial hgb  2.  ESRD-  normally MWF at [facility] -  no absolute indications for dialysis today-  plan will be for HD tomorrow off schedule per the dialysis RN 3 Hypertension/vol: appears euvolemic-  minimal UF required with HD 4. 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 Metabolic Bone Disease: will continue home vitamin D dosing -  no binder noted on home med list  Pt will not be physically seen over the weekend-  will be seen Monday and have dialysis arranged if still inpatient at that time-  call with any questions   Cecille Aver 01/09/2023, 10:16 AM

## 2023-01-09 NOTE — ED Provider Notes (Signed)
[place] EMERGENCY DEPARTMENT AT [facility] Provider Note   CSN: [id number] Arrival date & time: 01/09/23  5621     History  Chief Complaint  Patient presents with   Rectal Bleeding    [name] is a 82 y.o. female.  She has a history of stroke diabetes hypertension end-stage renal disease on dialysis Monday Wednesday Friday.  She said this morning when she went to have a bowel movement she passed significant amount of blood.  She had a small amount few days ago.  This is a new problem.  She has some pain on both of her hips and is having a hard time sitting.  She also endorses a little soreness on the left side of her abdomen.  No other bleeding appreciated.  No blood thinners.  She did not go to dialysis this morning.  No chest pain or shortness of breath  The history is provided by the patient and a relative.  Rectal Bleeding Quality:  Bright red Amount:  Moderate Timing:  Sporadic Chronicity:  New Context: defecation   Similar prior episodes: no   Relieved by:  None tried Worsened by:  Defecation Ineffective treatments:  None tried Associated symptoms: abdominal pain   Associated symptoms: no dizziness, no epistaxis, no fever, no hematemesis, no light-headedness, no loss of consciousness and no vomiting   Risk factors: no anticoagulant use        Home Medications Prior to Admission medications   Medication Sig Start Date End Date Taking? Authorizing Provider  albuterol (VENTOLIN HFA) 108 (90 Base) MCG/ACT inhaler Inhale 2 puffs into the lungs every 6 (six) hours as needed for wheezing or shortness of breath. 11/21/20   Sharee Holster, NP  aspirin 325 MG tablet Take 650 mg by mouth daily. 07/09/18   Malissa Hippo, MD  BD INSULIN SYRINGE U/F 31G X 5/16" 0.3 ML MISC Inject 1 each into the skin 2 (two) times daily. as directed 11/21/20   Sharee Holster, NP  cyanocobalamin (,VITAMIN B-12,) 1000 MCG/ML injection Inject 1,000 mcg into the muscle every 30  (thirty) days. 11/01/14   [provider]  diclofenac Sodium (VOLTAREN) 1 % GEL Apply 4 g topically 4 (four) times daily as needed. Apply to bilateral knees 03/16/21   Bing Neighbors, NP  DULoxetine (CYMBALTA) 20 MG capsule Take 1 capsule (20 mg total) by mouth daily. Patient not taking: Reported on 12/29/2022 12/09/22   Adonis Huguenin, NP  insulin aspart protamine- aspart (NOVOLOG MIX 70/30) (70-30) 100 UNIT/ML injection Inject 20 units with breakfast and 10 units with supper if pre-meal glucose readings are above 90 ad she is eating 08/07/22   Roma Kayser, MD  multivitamin (RENA-VIT) TABS tablet Take 1 tablet by mouth at bedtime. 08/21/14   Kirsteins, Victorino Sparrow, MD  omeprazole (PRILOSEC) 40 MG capsule TAKE ONE CAPSULE BY MOUTH TWICE DAILY BEFORE A meal Patient taking differently: Take 40 mg by mouth in the morning and at bedtime. 04/16/22   Mansouraty, Netty Starring., MD  sevelamer carbonate (RENVELA) 800 MG tablet Take 1 tablet (800 mg total) by mouth 2 (two) times daily before lunch and supper. 11/21/20   Sharee Holster, NP      Allergies    Ambien [zolpidem] and Reglan [metoclopramide]    Review of Systems   Review of Systems  Constitutional:  Negative for fever.  HENT:  Negative for nosebleeds.   Gastrointestinal:  Positive for abdominal pain and hematochezia. Negative for  hematemesis and vomiting.  Neurological:  Negative for dizziness, loss of consciousness and light-headedness.    Physical Exam Updated Vital Signs BP (!) 149/65   Pulse 72   Temp 97.7 F (36.5 C) (Oral)   Resp 18   SpO2 98%  Physical Exam Vitals and nursing note reviewed.  Constitutional:      General: She is not in acute distress.    Appearance: Normal appearance. She is well-developed.  HENT:     Head: Normocephalic and atraumatic.  Eyes:     Conjunctiva/sclera: Conjunctivae normal.  Cardiovascular:     Rate and Rhythm: Normal rate and regular rhythm.     Heart sounds: Murmur heard.   Pulmonary:     Effort: Pulmonary effort is normal. No respiratory distress.     Breath sounds: Normal breath sounds.  Abdominal:     Palpations: Abdomen is soft.     Tenderness: There is no abdominal tenderness. There is no guarding or rebound.  Genitourinary:    Comments: Rectal exam done with nurse Morrie Sheldon as chaperone.  There did not appear to be any masses or skin breakdown on her buttocks.  She had a few old external rectal tags.  Nontender.  Rectal exam normal tone no masses.  Brown stool guaiac negative. Musculoskeletal:        General: No deformity. Normal range of motion.     Cervical back: Neck supple.  Skin:    General: Skin is warm and dry.     Capillary Refill: Capillary refill takes less than 2 seconds.  Neurological:     Mental Status: She is alert. Mental status is at baseline.     ED Results / Procedures / Treatments   Labs (all labs ordered are listed, but only abnormal results are displayed) Labs Reviewed  COMPREHENSIVE METABOLIC PANEL - Abnormal; Notable for the following components:      Result Value   Potassium 3.2 (*)    Glucose, Bld 182 (*)    BUN 29 (*)    Creatinine, Ser 6.39 (*)    Calcium 8.2 (*)    Albumin 3.2 (*)    GFR, Estimated 6 (*)    All other components within normal limits  CBC - Abnormal; Notable for the following components:   WBC 12.1 (*)    RBC 3.35 (*)    Hemoglobin 10.0 (*)    HCT 30.5 (*)    RDW 16.7 (*)    All other components within normal limits  PROTIME-INR - Abnormal; Notable for the following components:   Prothrombin Time 16.3 (*)    INR 1.3 (*)    All other components within normal limits  HEMOGLOBIN AND HEMATOCRIT, BLOOD - Abnormal; Notable for the following components:   Hemoglobin 10.0 (*)    HCT 31.5 (*)    All other components within normal limits  CBG MONITORING, ED - Abnormal; Notable for the following components:   Glucose-Capillary 256 (*)    All other components within normal limits  HEPATITIS B SURFACE  ANTIGEN  HEMOGLOBIN A1C  HEMOGLOBIN AND HEMATOCRIT, BLOOD  HEMOGLOBIN AND HEMATOCRIT, BLOOD  HEPATITIS B SURFACE ANTIBODY, QUANTITATIVE  MAGNESIUM  CBC  BASIC METABOLIC PANEL  POC OCCULT BLOOD, ED  TYPE AND SCREEN    EKG EKG Interpretation Date/Time:  Friday January 09 2023 07:25:42 EDT Ventricular Rate:  74 PR Interval:  169 QRS Duration:  88 QT Interval:  414 QTC Calculation: 460 R Axis:   47  Text Interpretation: Sinus rhythm Borderline low  voltage, extremity leads Minimal ST depression, diffuse leads Confirmed by Meridee Score [phone]) on 01/09/2023 7:31:24 AM  Radiology CT ABDOMEN PELVIS W CONTRAST  Result Date: 01/09/2023 CLINICAL DATA:  LLQ abdominal pain rectal bleeding. EXAM: CT ABDOMEN AND PELVIS WITH CONTRAST TECHNIQUE: Multidetector CT imaging of the abdomen and pelvis was performed using the standard protocol following bolus administration of intravenous contrast. RADIATION DOSE REDUCTION: This exam was performed according to the departmental dose-optimization program which includes automated exposure control, adjustment of the mA and/or kV according to patient size and/or use of iterative reconstruction technique. CONTRAST:  80mL OMNIPAQUE IOHEXOL 300 MG/ML  SOLN COMPARISON:  CT abdomen/pelvis 11/21/2021. FINDINGS: Lower chest: Small bilateral pleural effusions with adjacent atelectasis in the lung bases. Coronary artery calcifications. Mild cardiomegaly. Hepatobiliary: No focal liver lesion. Dependent gallstones. Mild gallbladder wall thickening. No pericholecystic fluid or fat stranding. No biliary dilatation. Pancreas: Unremarkable. No pancreatic ductal dilatation or surrounding inflammatory changes. Spleen: Normal in size without focal abnormality. Adrenals/Urinary Tract: Adrenal glands are unremarkable. Unchanged bilateral renal atrophy. No mass or hydronephrosis. Bladder is decompressed. Stomach/Bowel: Normal stomach and duodenum. No dilated loops of small bowel. Normal  appendix is visualized on axial image 63 series 2. Extensive colonic diverticulosis colon is decompressed. Mild wall thickening and free fluid in the pelvis, suggestive of possible acute diverticulitis. Vascular/Lymphatic: Aortic atherosclerosis. No enlarged abdominal or pelvic lymph nodes. Reproductive: Uterus and bilateral adnexa are unremarkable. Other: No abdominal wall hernia. Musculoskeletal: Multilevel lumbar spondylosis, worst at L4-5, where there is severe spinal canal stenosis. IMPRESSION: 1. Extensive colonic diverticulosis with mild wall thickening and free fluid in the pelvis, suggestive of possible acute diverticulitis. No evidence of perforation or abscess. 2. Cholelithiasis with mild gallbladder wall thickening. No pericholecystic fluid or fat stranding. If there is clinical concern for acute cholecystitis, consider further evaluation with right upper quadrant ultrasound. 3. Small bilateral pleural effusions with adjacent atelectasis in the lung bases. 4. Multilevel lumbar spondylosis, worst at L4-5, where there is severe spinal canal stenosis. Aortic Atherosclerosis (ICD10-I70.0). Electronically Signed   By: Orvan Falconer M.D.   On: 01/09/2023 09:41    Procedures Procedures    Medications Ordered in ED Medications  sevelamer carbonate (RENVELA) tablet 800 mg (800 mg Oral Not Given 01/09/23 1318)  multivitamin (RENA-VIT) tablet 1 tablet (has no administration in time range)  albuterol (PROVENTIL) (2.5 MG/3ML) 0.083% nebulizer solution 3 mL (has no administration in time range)  pantoprazole (PROTONIX) EC tablet 40 mg (40 mg Oral Not Given 01/09/23 1317)  acetaminophen (TYLENOL) tablet 650 mg (has no administration in time range)    Or  acetaminophen (TYLENOL) suppository 650 mg (has no administration in time range)  ondansetron (ZOFRAN) tablet 4 mg (has no administration in time range)    Or  ondansetron (ZOFRAN) injection 4 mg (has no administration in time range)  cefTRIAXone  (ROCEPHIN) 2 g in sodium chloride 0.9 % 100 mL IVPB (has no administration in time range)  insulin aspart (novoLOG) injection 0-9 Units ( Subcutaneous Not Given 01/09/23 1318)  Chlorhexidine Gluconate Cloth 2 % PADS 6 each (has no administration in time range)  calcitRIOL (ROCALTROL) capsule 1 mcg (has no administration in time range)  Darbepoetin Alfa (ARANESP) injection 60 mcg (has no administration in time range)  metroNIDAZOLE (FLAGYL) IVPB 500 mg (has no administration in time range)  iohexol (OMNIPAQUE) 300 MG/ML solution 80 mL (80 mLs Intravenous Contrast Given 01/09/23 0912)  cefTRIAXone (ROCEPHIN) 2 g in sodium chloride 0.9 % 100 mL IVPB (0  g Intravenous Stopped 01/09/23 1057)    And  metroNIDAZOLE (FLAGYL) IVPB 500 mg (0 mg Intravenous Stopped 01/09/23 1452)    ED Course/ Medical Decision Making/ A&P Clinical Course as of 01/09/23 1734  Fri Jan 09, 2023  1000 Discussed with gastroenterology Dr. Tasia Catchings.  He said with her age and bleeding it would not be unreasonable to keep her in the hospital overnight, IV antibiotics and serial hemoglobins.  He will see in consult. [MB]  1006 Discussed with Dr. Kathrene Bongo nephrology who will arrange for patient to get dialysis. [MB]    Clinical Course User Index [MB] Terrilee Files, MD                             Medical Decision Making Amount and/or Complexity of Data Reviewed Labs: ordered. Radiology: ordered.  Risk Prescription drug management. Decision regarding hospitalization.   This patient complains of rectal bleeding and abdominal pain; this involves an extensive number of treatment Options and is a complaint that carries with it a high risk of complications and morbidity. The differential includes diverticulitis, colitis, hemorrhoids, lower GI bleed  I ordered, reviewed and interpreted labs, which included CBC with mildly elevated white count stable hemoglobin, chemistries consistent with her CKD, fecal occult negative I  ordered medication IV antibiotics and reviewed PMP when indicated. I ordered imaging studies which included CT abdomen and pelvis and I independently    visualized and interpreted imaging which showed diverticulitis, cholelithiasis Additional history obtained from patient's daughter Previous records obtained and reviewed in epic including her prior GI notes I consulted GI Dr. Tasia Catchings, nephrology Dr. Kathrene Bongo, [facility] hospitalist Dr. Sherryll Burger and discussed lab and imaging findings and discussed disposition.  Cardiac monitoring reviewed, normal sinus rhythm Social determinants considered, no significant barriers Critical Interventions: None  After the interventions stated above, I reevaluated the patient and found patient to be fairly asymptomatic here hemodynamically stable Admission and further testing considered, she would benefit from mission the hospital for serial crits and IV antibiotics.  She will likely need dialysis to take off contrast.         Final Clinical Impression(s) / ED Diagnoses Final diagnoses:  Acute diverticulitis  Lower GI bleed  End stage renal disease (HCC)    Rx / DC Orders ED Discharge Orders     None         Terrilee Files, MD 01/09/23 [phone]

## 2023-01-09 NOTE — ED Triage Notes (Signed)
Pt c/o rectal bleed when she got up this morning to go to the bathroom. Pt c/o lower back pain. Denies any abdominal pain.

## 2023-01-09 NOTE — Consult Note (Signed)
Gastroenterology Consult   Referring Provider: No ref. provider found Primary Care Physician:  Mirna Mires, MD Primary Gastroenterologist:  Dr. Levon Hedger  Patient ID: [name]; [id number]; 02/23/1941   Admit date: 01/09/2023  LOS: 0 days   Date of Consultation: 01/09/2023  Reason for Consultation:  rectal bleeding  History of Present Illness   [name] is a 82 y.o. 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 GI consulted for further evaluation.    ED Course: Vitals - hypertensive 169/79, no dyspnea.  Labs - Hgb 10 (stable from Jan), MCV 91, WBC 12.1, K 3.2, LFT wnl. INR 1.3 CT A/P - no focal liver lesion, dependent gallstones with mild wall thickening. No biliary dilation. Extensive colonic diverticulosis (decompressed) with mild wall thickening and free fluid in the pelvis suggesting acute diverticulitis. IV rocephin and flagyl ordered.  EDP performed rectal exam  -no masses or skin breakdown. External skin tags. Normal tone. Stool heme negative.   Consult:  Takes 650 mg ASA daily. Patient denies any other NSAID use. She has had about a week of LLQ abdominal pain described as mild and not severe. Denies fevers. Began having some mild rectal bleeding on Monday (scant amount) and some scattered bleeding up until just prior to admission where she reports a much larger episode of rectal bleeding. Denies shortness of breath. Continues to have some mild dysphagia. + flatus  Seen in our office 7/15 by Doylene Bode, NP. Presented with Dysphagia. Denied any rectal bleeding. Noted to have had 3 negative stool cards earlier this year. chromagranin A level: 634.2 on 10/10/21, PET done July 2023 with no metastatic evidence, elevated Chromagranin A off PPI therapy in August 2023. Advised PPI BID and referral  to [facility] to Dr. Servando Salina for repeat EUS in November this year. EGD =/- dilation ordered. Advised to complete HFE labs. Follow up in 4 months.   Labs in February with elevated ferritin of 1365 and 1418 in December 2023. Hemochromatosis labs ordered in March but not yet performed.   EGD/EUS: Nov 2023   EGD impression: (biopsy negative) - No gross lesions in the proximal esophagus and in   the mid esophagus. - White nummular lesions in esophageal mucosa in the distal esophagus. Biopsied to rule out Candida. - Tortuous esophagus throughout. - Non-obstructing Schatzki ring. - 4 cm hiatal hernia. - Erythematous mucosa in the antrum -previously biopsied and negative for HP so not rebiopsied. - Duodenal scar in the bulb, mostly flat but 1 small 1 mm appearing polypoid region was noted. This was biopsied to rule out recurrent/persistent neuroendocrine tumor. - No other gross lesions in the duodenal bulb, in the first portion of the duodenum and in the second portion of the duodenum. EUS impression: - No evidence of any intramural lesion noted in the duodenal bulb and apex of the duodenum. - No abnormality noted of the wall of the stomach distally. - No malignant-appearing lymph nodes were visualized in the celiac region (level 20), peripancreatic region and porta hepatis region - Plan for repeat EGD/EUS in 1 year. DETECTNET PET-CT to be done after next EUS.   EGD/EUS: 10/07/21  EGD Impression: Tortuous esophagus. Low-grade of narrowing Schatzki ring - disrupted. Z-line regular, 36 cm from the incisors. - 4 cm hiatal hernia. - Gastritis - previously HP negative on biopsies. J-shaped deformity  of the entire stomach. Submucosal nodule found in the duodenum. After EUS, and based on prior pathology, Cap-Lift-Band EMR performed. Clips (MR conditional) were placed. - No other gross lesions in the duodenal bulb, in the first portion of the duodenum and in the second portion of the duodenum. EUS  Impression: - An intramural (subepithelial) lesion was found in the duodenal bulb. The lesion appeared to originate from within the deep mucosa (Layer 2). A tissue diagnosis was obtained prior to this exam. This is consistent with previously biopsied neuroendocrine tumor. - No malignant-appearing lymph nodes were visualized in the gastrohepatic ligament (level 18), perigastric region, porta hepatis region and upper-abdominal periaortic region. - Multiple stones were visualized endosonographically in the gallbladder.  EGD March 2023: - Non- obstructing Schatzki ring. Dilated.  - 2 cm hiatal hernia.  - Gastric mucosal atrophy.  - Gastritis. Biopsied.  - Non- bleeding gastric ulcers with a clean ulcer base ( Forrest Class III) .  - Mucosal nodule found in the duodenum. Biopsied. -Pathology revealed NET , referred to Dr. Meridee Score for EUS  Last Colonoscopy:07/08/18 Diverticulosis in the entire examined colon. - Two 5 to 6 mm polyps in the proximal descending colon, removed with a cold snare. Resected and retrieved. - External hemorrhoids.  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   Dialysis patient (HCC)    M, W, F   Fistula    R arm   GERD (gastroesophageal reflux disease)    Hypertension    Renal disorder    Shortness of breath    [facility])    right side weakness    Past Surgical History:  Procedure Laterality Date   AMPUTATION Right 01/13/2020   Procedure: AMPUTATION RIGHT FOOT 1ST RAY;  Surgeon: Nadara Mustard, MD;  Location: [facility] OR;  Service: Orthopedics;  Laterality: Right;   AV FISTULA PLACEMENT Right 09/08/2013   Procedure: CREATION OF RIGHT BRACHIAL CEPHALIC ARTERIOVENOUS FISTULA ;  Surgeon: Pryor Ochoa, MD;  Location: [facility] OR;  Service: Vascular;  Laterality: Right;   BASCILIC VEIN TRANSPOSITION Right 01/26/2014    Procedure: Right Arm BASILIC VEIN TRANSPOSITION;  Surgeon: Pryor Ochoa, MD;  Location: [facility] OR;  Service: Vascular;  Laterality: Right;   BIOPSY  09/10/2021   Procedure: BIOPSY;  Surgeon: Dolores Frame, MD;  Location: [facility];  Service: Gastroenterology;;  gastric   BIOPSY  10/07/2021   Procedure: BIOPSY;  Surgeon: Lemar Lofty., MD;  Location: [facility];  Service: Gastroenterology;;   BIOPSY  04/17/2022   Procedure: BIOPSY;  Surgeon: Lemar Lofty., MD;  Location: [facility];  Service: Gastroenterology;;   CATARACT EXTRACTION W/PHACO  11/20/2011   Procedure: CATARACT EXTRACTION PHACO AND INTRAOCULAR LENS PLACEMENT (IOC);  Surgeon: Gemma Payor, MD;  Location: [facility];  Service: Ophthalmology;  Laterality: Right;  CDE 18.82   CATARACT EXTRACTION W/PHACO Left 11/18/2012   Procedure: CATARACT EXTRACTION PHACO AND INTRAOCULAR LENS PLACEMENT (IOC);  Surgeon: Gemma Payor, MD;  Location: [facility];  Service: Ophthalmology;  Laterality: Left;  CDE: 18.97   COLONOSCOPY N/A 02/09/2013   Procedure: COLONOSCOPY;  Surgeon: Malissa Hippo, MD;  Location: [facility];  Service: Endoscopy;  Laterality: N/A;  305-moved to 220 Ann to notify pt   COLONOSCOPY N/A 07/08/2018   Procedure: COLONOSCOPY;  Surgeon: Karilyn Cota,  Joline Maxcy, MD;  Location: [facility];  Service: Endoscopy;  Laterality: N/A;  930   ENDOSCOPIC MUCOSAL RESECTION N/A 10/07/2021   Procedure: ENDOSCOPIC MUCOSAL RESECTION;  Surgeon: Meridee Score Netty Starring., MD;  Location: [facility];  Service: Gastroenterology;  Laterality: N/A;   ESOPHAGOGASTRODUODENOSCOPY N/A 04/17/2022   Procedure: ESOPHAGOGASTRODUODENOSCOPY (EGD);  Surgeon: Lemar Lofty., MD;  Location: [facility];  Service: Gastroenterology;  Laterality: N/A;   ESOPHAGOGASTRODUODENOSCOPY (EGD) WITH PROPOFOL N/A 09/10/2021   Procedure: ESOPHAGOGASTRODUODENOSCOPY (EGD) WITH PROPOFOL;  Surgeon: Dolores Frame, MD;  [facility]: [facility];   Service: Gastroenterology;  Laterality: N/A;   ESOPHAGOGASTRODUODENOSCOPY (EGD) WITH PROPOFOL N/A 10/07/2021   Procedure: ESOPHAGOGASTRODUODENOSCOPY (EGD) WITH PROPOFOL;  Surgeon: Meridee Score Netty Starring., MD;  Location: [facility];  Service: Gastroenterology;  Laterality: N/A;   EUS N/A 10/07/2021   Procedure: UPPER ENDOSCOPIC ULTRASOUND (EUS) RADIAL;  Surgeon: Lemar Lofty., MD;  Location: [facility];  Service: Gastroenterology;  Laterality: N/A;   GASTRIC VARICES BANDING  10/07/2021   Procedure: duodenal tumor BANDING;  Surgeon: Meridee Score Netty Starring., MD;  Location: [facility];  Service: Gastroenterology;;  with emr kit   HEMOSTASIS CLIP PLACEMENT  10/07/2021   Procedure: HEMOSTASIS CLIP PLACEMENT;  Surgeon: Lemar Lofty., MD;  Location: [facility];  Service: Gastroenterology;;   INSERTION OF DIALYSIS CATHETER Right 06/24/2013   Procedure: INSERTION OF DIALYSIS CATHETER: Ultrasound guided;  Surgeon: Nada Libman, MD;  Location: [facility] OR;  Service: Vascular;  Laterality: Right;   LIGATION OF ARTERIOVENOUS  FISTULA Right 01/26/2014   Procedure: LIGATION OF ARTERIOVENOUS  FISTULA;  Surgeon: Pryor Ochoa, MD;  Location: [facility] OR;  Service: Vascular;  Laterality: Right;   LOOP RECORDER IMPLANT N/A 07/21/2013   Procedure: LOOP RECORDER IMPLANT;  Surgeon: Marinus Maw, MD;  Location: [facility] CATH LAB;  Service: Cardiovascular;  Laterality: N/A;   POLYPECTOMY  07/08/2018   Procedure: POLYPECTOMY;  Surgeon: Malissa Hippo, MD;  Location: [facility];  Service: Endoscopy;;  Descending colon polyps x 2    SUBMUCOSAL LIFTING INJECTION  10/07/2021   Procedure: SUBMUCOSAL LIFTING INJECTION;  Surgeon: Lemar Lofty., MD;  Location: [facility];  Service: Gastroenterology;;   TEE WITHOUT CARDIOVERSION N/A 07/21/2013   Procedure: TRANSESOPHAGEAL ECHOCARDIOGRAM (TEE);  Surgeon: Lars Masson, MD;  Location: [facility] ENDOSCOPY;  Service: Cardiovascular;  Laterality: N/A;   UPPER  ESOPHAGEAL ENDOSCOPIC ULTRASOUND (EUS) N/A 04/17/2022   Procedure: UPPER ESOPHAGEAL ENDOSCOPIC ULTRASOUND (EUS);  Surgeon: Lemar Lofty., MD;  Location: [facility];  Service: Gastroenterology;  Laterality: N/A;    Prior to Admission medications   Medication Sig Start Date End Date Taking? Authorizing Provider  albuterol (VENTOLIN HFA) 108 (90 Base) MCG/ACT inhaler Inhale 2 puffs into the lungs every 6 (six) hours as needed for wheezing or shortness of breath. 11/21/20   Sharee Holster, NP  aspirin 325 MG tablet Take 650 mg by mouth daily. 07/09/18   Malissa Hippo, MD  BD INSULIN SYRINGE U/F 31G X 5/16" 0.3 ML MISC Inject 1 each into the skin 2 (two) times daily. as directed 11/21/20   Sharee Holster, NP  cyanocobalamin (,VITAMIN B-12,) 1000 MCG/ML injection Inject 1,000 mcg into the muscle every 30 (thirty) days. 11/01/14   [provider]  diclofenac Sodium (VOLTAREN) 1 % GEL Apply 4 g topically 4 (four) times daily as needed. Apply to bilateral knees 03/16/21   Bing Neighbors, NP  DULoxetine (CYMBALTA) 20 MG capsule Take 1 capsule (20 mg total) by mouth  daily. Patient not taking: Reported on 12/29/2022 12/09/22   Adonis Huguenin, NP  insulin aspart protamine- aspart (NOVOLOG MIX 70/30) (70-30) 100 UNIT/ML injection Inject 20 units with breakfast and 10 units with supper if pre-meal glucose readings are above 90 ad she is eating 08/07/22   Roma Kayser, MD  multivitamin (RENA-VIT) TABS tablet Take 1 tablet by mouth at bedtime. 08/21/14   Kirsteins, Victorino Sparrow, MD  omeprazole (PRILOSEC) 40 MG capsule TAKE ONE CAPSULE BY MOUTH TWICE DAILY BEFORE A meal Patient taking differently: Take 40 mg by mouth in the morning and at bedtime. 04/16/22   Mansouraty, Netty Starring., MD  sevelamer carbonate (RENVELA) 800 MG tablet Take 1 tablet (800 mg total) by mouth 2 (two) times daily before lunch and supper. 11/21/20   Sharee Holster, NP    Current Facility-Administered Medications   Medication Dose Route Frequency Provider Last Rate Last Admin   cefTRIAXone (ROCEPHIN) 2 g in sodium chloride 0.9 % 100 mL IVPB  2 g Intravenous Once Terrilee Files, MD       And   metroNIDAZOLE (FLAGYL) IVPB 500 mg  500 mg Intravenous Once Terrilee Files, MD       Current Outpatient Medications  Medication Sig Dispense Refill   albuterol (VENTOLIN HFA) 108 (90 Base) MCG/ACT inhaler Inhale 2 puffs into the lungs every 6 (six) hours as needed for wheezing or shortness of breath. 1 each 0   aspirin 325 MG tablet Take 650 mg by mouth daily.     BD INSULIN SYRINGE U/F 31G X 5/16" 0.3 ML MISC Inject 1 each into the skin 2 (two) times daily. as directed 100 each 0   cyanocobalamin (,VITAMIN B-12,) 1000 MCG/ML injection Inject 1,000 mcg into the muscle every 30 (thirty) days.  11   diclofenac Sodium (VOLTAREN) 1 % GEL Apply 4 g topically 4 (four) times daily as needed. Apply to bilateral knees 100 g 0   DULoxetine (CYMBALTA) 20 MG capsule Take 1 capsule (20 mg total) by mouth daily. (Patient not taking: Reported on 12/29/2022) 30 capsule 3   insulin aspart protamine- aspart (NOVOLOG MIX 70/30) (70-30) 100 UNIT/ML injection Inject 20 units with breakfast and 10 units with supper if pre-meal glucose readings are above 90 ad she is eating 10 mL 0   multivitamin (RENA-VIT) TABS tablet Take 1 tablet by mouth at bedtime. 30 tablet 0   omeprazole (PRILOSEC) 40 MG capsule TAKE ONE CAPSULE BY MOUTH TWICE DAILY BEFORE A meal (Patient taking differently: Take 40 mg by mouth in the morning and at bedtime.) 180 capsule 3   sevelamer carbonate (RENVELA) 800 MG tablet Take 1 tablet (800 mg total) by mouth 2 (two) times daily before lunch and supper. 60 tablet 0    Allergies as of 01/09/2023 - Review Complete 01/09/2023  Allergen Reaction Noted   Ambien [zolpidem] Other (See Comments) 07/12/2013   Reglan [metoclopramide] Other (See Comments) 08/02/2014    Family History  Problem Relation Age of Onset    Cancer Sister    Cancer Brother    Anesthesia problems Neg Hx    Hypotension Neg Hx    Malignant hyperthermia Neg Hx    Pseudochol deficiency Neg Hx     Social History   Socioeconomic History   Marital status: Widowed    Spouse name: Not on file   Number of children: Not on file   Years of education: Not on file   Highest education level: Not on file  Occupational History   Not on file  Tobacco Use   Smoking status: Never    Passive exposure: Never   Smokeless tobacco: Never  Vaping Use   Vaping status: Never Used  Substance and Sexual Activity   Alcohol use: No   Drug use: No   Sexual activity: Not on file  Other Topics Concern   Not on file  Social History Narrative   ** Merged History Encounter **       Social Determinants of Health   Financial Resource Strain: Not on file  Food Insecurity: No Food Insecurity (06/13/2022)   Hunger Vital Sign    Worried About Running Out of Food in the Last Year: Never true    Ran Out of Food in the Last Year: Never true  Transportation Needs: No Transportation Needs (06/13/2022)   PRAPARE - Administrator, Civil Service (Medical): No    Lack of Transportation (Non-Medical): No  Physical Activity: Not on file  Stress: Not on file  Social Connections: Not on file  Intimate Partner Violence: Not At Risk (06/13/2022)   Humiliation, Afraid, Rape, and Kick questionnaire    Fear of Current or Ex-Partner: No    Emotionally Abused: No    Physically Abused: No    Sexually Abused: No     Review of Systems   Gen: Denies any fever, chills, loss of appetite, change in weight or weight loss CV: Denies chest pain, heart palpitations, syncope, edema  Resp: Denies shortness of breath with rest, cough, wheezing, coughing up blood, and pleurisy. GI: Denies vomiting blood, jaundice, and fecal incontinence.   Denies dysphagia or odynophagia. GU : Denies urinary burning, blood in urine, urinary frequency, and urinary  incontinence. MS: Denies joint pain, limitation of movement, swelling, cramps, and atrophy.  Derm: Denies rash, itching, dry skin, hives. Psych: Denies depression, anxiety, memory loss, hallucinations, and confusion. Heme: Denies bruising or bleeding Neuro:  Denies any headaches, dizziness, paresthesias, shaking  Physical Exam   Vital Signs in last 24 hours: Temp:  [97.7 F (36.5 C)-97.8 F (36.6 C)] 97.8 F (36.6 C) (07/26 1009) Pulse Rate:  [72-90] 90 (07/26 0945) Resp:  [17-23] 17 (07/26 0945) BP: (149-175)/(61-76) 169/76 (07/26 0945) SpO2:  [96 %-99 %] 97 % (07/26 0945)    General:   Alert,  Well-developed, well-nourished, pleasant and cooperative in NAD Head:  Normocephalic and atraumatic. Eyes:  Sclera clear, no icterus.   Conjunctiva pink. Ears:  Normal auditory acuity. Mouth:  No deformity or lesions, dentition normal. Neck:  Supple; no masses Lungs:  Clear throughout to auscultation.   No wheezes, crackles, or rhonchi. No acute distress. Heart:  Regular rate and rhythm; no murmurs, clicks, rubs,  or gallops. Abdomen:  Soft, nondistended. Mild ttp to LLQ. No masses, hepatosplenomegaly or hernias noted. Normal bowel sounds, without guarding, and without rebound.   Rectal: deferred   Msk:  Symmetrical without gross deformities. Normal posture. Extremities:  Without clubbing or edema. Neurologic:  Alert and  oriented x4. Skin:  Intact without significant lesions or rashes. Psych:  Alert and cooperative. Normal mood and affect.  Intake/Output from previous day: No intake/output data recorded. Intake/Output this shift: No intake/output data recorded.  Labs/Studies   Recent Labs Recent Labs    01/09/23 0614  WBC 12.1*  HGB 10.0*  HCT 30.5*  PLT 151   BMET Recent Labs    01/09/23 0614  NA 137  K 3.2*  CL 98  CO2 27  GLUCOSE 182*  BUN 29*  CREATININE 6.39*  CALCIUM 8.2*   LFT Recent Labs    01/09/23 0614  PROT 7.6  ALBUMIN 3.2*  AST 18  ALT 11   ALKPHOS 71  BILITOT 0.7   PT/INR Recent Labs    01/09/23 0715  LABPROT 16.3*  INR 1.3*   Hepatitis Panel No results for input(s): "HEPBSAG", "HCVAB", "HEPAIGM", "HEPBIGM" in the last 72 hours. C-Diff No results for input(s): "CDIFFTOX" in the last 72 hours.  Radiology/Studies CT ABDOMEN PELVIS W CONTRAST  Result Date: 01/09/2023 CLINICAL DATA:  LLQ abdominal pain rectal bleeding. EXAM: CT ABDOMEN AND PELVIS WITH CONTRAST TECHNIQUE: Multidetector CT imaging of the abdomen and pelvis was performed using the standard protocol following bolus administration of intravenous contrast. RADIATION DOSE REDUCTION: This exam was performed according to the departmental dose-optimization program which includes automated exposure control, adjustment of the mA and/or kV according to patient size and/or use of iterative reconstruction technique. CONTRAST:  80mL OMNIPAQUE IOHEXOL 300 MG/ML  SOLN COMPARISON:  CT abdomen/pelvis 11/21/2021. FINDINGS: Lower chest: Small bilateral pleural effusions with adjacent atelectasis in the lung bases. Coronary artery calcifications. Mild cardiomegaly. Hepatobiliary: No focal liver lesion. Dependent gallstones. Mild gallbladder wall thickening. No pericholecystic fluid or fat stranding. No biliary dilatation. Pancreas: Unremarkable. No pancreatic ductal dilatation or surrounding inflammatory changes. Spleen: Normal in size without focal abnormality. Adrenals/Urinary Tract: Adrenal glands are unremarkable. Unchanged bilateral renal atrophy. No mass or hydronephrosis. Bladder is decompressed. Stomach/Bowel: Normal stomach and duodenum. No dilated loops of small bowel. Normal appendix is visualized on axial image 63 series 2. Extensive colonic diverticulosis colon is decompressed. Mild wall thickening and free fluid in the pelvis, suggestive of possible acute diverticulitis. Vascular/Lymphatic: Aortic atherosclerosis. No enlarged abdominal or pelvic lymph nodes. Reproductive:  Uterus and bilateral adnexa are unremarkable. Other: No abdominal wall hernia. Musculoskeletal: Multilevel lumbar spondylosis, worst at L4-5, where there is severe spinal canal stenosis. IMPRESSION: 1. Extensive colonic diverticulosis with mild wall thickening and free fluid in the pelvis, suggestive of possible acute diverticulitis. No evidence of perforation or abscess. 2. Cholelithiasis with mild gallbladder wall thickening. No pericholecystic fluid or fat stranding. If there is clinical concern for acute cholecystitis, consider further evaluation with right upper quadrant ultrasound. 3. Small bilateral pleural effusions with adjacent atelectasis in the lung bases. 4. Multilevel lumbar spondylosis, worst at L4-5, where there is severe spinal canal stenosis. Aortic Atherosclerosis (ICD10-I70.0). Electronically Signed   By: Orvan Falconer M.D.   On: 01/09/2023 09:41     Assessment   [name] is a 82 y.o. 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 Has CT evidence of mild diverticulitis as well. GI consulted for further evaluation and recommendations. Appears patient being admitted for rectal bleeding and to undergo dialysis since she missed her session this morning.   Rectal bleeding: Has had some scant rectal bleeding intermittent since Monday. Had larger episode early this morning. Denies frequent NSAID use. Rectal exam by EDP with report of no mass and stool heme negative. Hgb is stable at 10 and no further episodes since admission.   Diverticulitis: Has been having mild LLQ pain/tenderness for 1 week. CT with significant diverticulosis and mild wall thickening suggestive diverticulitis. She has mild leukocytosis with WBC >12).   Plan / Recommendations   Monitor for recurrent rectal bleeding, if overt bleeding or  hemodynamic instability then would recommend stat CTA Trend H/H, transfuse for Hgb <7 Abx for 10 days for diverticulitis given leukocytosis (cipro + flagyl or rocephin + flagyl okay), renal dosing. Transition to oral on d/c Outpatient colonoscopy in 6-8 weeks Okay to keep scheduled EGD 8/20 OV in clinic is 4 weeks for follow up.      01/09/2023, 10:13 AM  Brooke Bonito, MSN, FNP-BC, AGACNP-BC [facility]

## 2023-01-10 DIAGNOSIS — K625 Hemorrhage of anus and rectum: Secondary | ICD-10-CM | POA: Diagnosis not present

## 2023-01-10 LAB — GLUCOSE, CAPILLARY
Glucose-Capillary: 150 mg/dL — ABNORMAL HIGH (ref 70–99)
Glucose-Capillary: 155 mg/dL — ABNORMAL HIGH (ref 70–99)
Glucose-Capillary: 181 mg/dL — ABNORMAL HIGH (ref 70–99)
Glucose-Capillary: 97 mg/dL (ref 70–99)

## 2023-01-10 MED ORDER — ASPIRIN 81 MG PO TBEC: 81.0000 mg | DELAYED_RELEASE_TABLET | Freq: Every day | ORAL | 2 refills | Status: DC

## 2023-01-10 MED ORDER — METRONIDAZOLE 500 MG PO TABS: 500.0000 mg | ORAL_TABLET | Freq: Two times a day (BID) | ORAL | 0 refills | Status: AC

## 2023-01-10 MED ORDER — PENTAFLUOROPROP-TETRAFLUOROETH EX AERO: 1.0000 | INHALATION_SPRAY | CUTANEOUS | Status: DC | PRN

## 2023-01-10 MED ORDER — CIPROFLOXACIN HCL 500 MG PO TABS: 500.0000 mg | ORAL_TABLET | Freq: Two times a day (BID) | ORAL | 0 refills | Status: AC

## 2023-01-10 NOTE — Progress Notes (Signed)
Patient returned from dialysis , VS taken    01/10/23 1730  Vitals  BP (!) 177/94  MAP (mmHg) 120  BP Location Left Arm  BP Method Automatic  Patient Position (if appropriate) Sitting  Pulse Rate 84  Pulse Rate Source Monitor  Resp 20  Level of Consciousness  Level of Consciousness Alert  MEWS COLOR  MEWS Score Color Green  Oxygen Therapy  SpO2 100 %  O2 Device Room Air  MEWS Score  MEWS Temp 0  MEWS Systolic 0  MEWS Pulse 0  MEWS RR 0  MEWS LOC 0  MEWS Score 0

## 2023-01-10 NOTE — Plan of Care (Signed)

## 2023-01-10 NOTE — Plan of Care (Signed)
  Problem: Education: Goal: Ability to describe self-care measures that may prevent or decrease complications (Diabetes Survival Skills Education) will improve Outcome: Completed/Met Goal: Individualized Educational Video(s) Outcome: Completed/Met

## 2023-01-10 NOTE — Discharge Summary (Signed)
Physician Discharge Summary  [name] MVH:[id number] DOB: 11/02/1940 DOA: 01/09/2023  PCP: Mirna Mires, MD  Admit date: 01/09/2023  Discharge date: 01/10/2023  Admitted From: Home  Disposition: Home  Recommendations for Outpatient Follow-up:  Follow up with PCP in 1-2 weeks Follow-up with GI outpatient which will be arranged, remain on ciprofloxacin and Flagyl as prescribed for 10 more days for treatment of diverticulitis Follow-up CBC in 1 week  Home Health: None  Equipment/Devices: None  Discharge Condition:Stable  CODE STATUS: Full  Diet recommendation: Heart Healthy/carb modified  Brief/Interim Summary:  [name] is a 82 y.o. female with medical history significant for ESRD on HD MWF, type 2 diabetes, GERD, hypertension, and prior CVA who presented to the ED after episode of rectal bleeding noted noted this morning prior to dialysis.  She is also complaining of some left-sided abdominal pain.  She was admitted for uncomplicated diverticulitis with associated rectal bleeding.  Hemoglobin levels have remained stable and she was started initially on IV antibiotics.  She is tolerating diet and has had hemodialysis prior to discharge with no other complications or concerns noted this hospitalization.  She will remain on antibiotics as prescribed for 10 more days to complete treatment and will follow-up with GI as recommended.  Discharge Diagnoses:  Principal Problem:   Rectal bleed Active Problems:   Essential hypertension   Anemia   ESRD (end stage renal disease) on dialysis (HCC)   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 disease (HCC)   DMII (diabetes mellitus, type 2) (HCC)   GERD (gastroesophageal reflux disease)  Principal discharge diagnosis: Uncomplicated diverticulitis with mild rectal bleeding.  Discharge Instructions  Discharge Instructions     Diet - low sodium heart healthy   Complete by: As directed    Increase activity slowly   Complete by: As  directed       Allergies as of 01/10/2023       Reactions   Ambien [zolpidem] Other (See Comments)   Hallucinations    Reglan [metoclopramide] Other (See Comments)   "makes me crazy"        Medication List     STOP taking these medications    aspirin 325 MG tablet Replaced by: aspirin EC 81 MG tablet       TAKE these medications    acetaminophen 500 MG tablet Commonly known as: TYLENOL Take 500 mg by mouth at bedtime.   albuterol 108 (90 Base) MCG/ACT inhaler Commonly known as: VENTOLIN HFA Inhale 2 puffs into the lungs every 6 (six) hours as needed for wheezing or shortness of breath.   aspirin EC 81 MG tablet Take 1 tablet (81 mg total) by mouth daily. Swallow whole. Replaces: aspirin 325 MG tablet   ciprofloxacin 500 MG tablet Commonly known as: Cipro Take 1 tablet (500 mg total) by mouth 2 (two) times daily for 10 days.   cyanocobalamin 1000 MCG/ML injection Commonly known as: VITAMIN B12 Inject 1,000 mcg into the muscle every 30 (thirty) days.   diclofenac Sodium 1 % Gel Commonly known as: Voltaren Apply 4 g topically 4 (four) times daily as needed. Apply to bilateral knees   HumuLIN 70/30 (70-30) 100 UNIT/ML injection Generic drug: insulin NPH-regular Human Inject 5-10 Units into the skin See admin instructions. Inject 10 units into the skin every morning and 5 units at night.   labetalol 200 MG tablet Commonly known as: NORMODYNE Take 200 mg by mouth 2 (two) times daily.   metroNIDAZOLE 500 MG tablet Commonly known  as: Flagyl Take 1 tablet (500 mg total) by mouth 2 (two) times daily for 10 days.   multivitamin Tabs tablet Take 1 tablet by mouth at bedtime.   omeprazole 40 MG capsule Commonly known as: PRILOSEC TAKE ONE CAPSULE BY MOUTH TWICE DAILY BEFORE A meal What changed: See the new instructions.        Follow-up Information     Mirna Mires, MD. Schedule an appointment as soon as possible for a visit in 1 week(s).   Specialty:  Family Medicine Contact information: [address] [place] [address] [phone]         [facility]. Go to.   Contact information: [address] [place] [address] [phone]               Allergies  Allergen Reactions   Ambien [Zolpidem] Other (See Comments)    Hallucinations    Reglan [Metoclopramide] Other (See Comments)    "makes me crazy"    Consultations: None   Procedures/Studies: CT ABDOMEN PELVIS W CONTRAST  Result Date: 01/09/2023 CLINICAL DATA:  LLQ abdominal pain rectal bleeding. EXAM: CT ABDOMEN AND PELVIS WITH CONTRAST TECHNIQUE: Multidetector CT imaging of the abdomen and pelvis was performed using the standard protocol following bolus administration of intravenous contrast. RADIATION DOSE REDUCTION: This exam was performed according to the departmental dose-optimization program which includes automated exposure control, adjustment of the mA and/or kV according to patient size and/or use of iterative reconstruction technique. CONTRAST:  80mL OMNIPAQUE IOHEXOL 300 MG/ML  SOLN COMPARISON:  CT abdomen/pelvis 11/21/2021. FINDINGS: Lower chest: Small bilateral pleural effusions with adjacent atelectasis in the lung bases. Coronary artery calcifications. Mild cardiomegaly. Hepatobiliary: No focal liver lesion. Dependent gallstones. Mild gallbladder wall thickening. No pericholecystic fluid or fat stranding. No biliary dilatation. Pancreas: Unremarkable. No pancreatic ductal dilatation or surrounding inflammatory changes. Spleen: Normal in size without focal abnormality. Adrenals/Urinary Tract: Adrenal glands are unremarkable. Unchanged bilateral renal atrophy. No mass or hydronephrosis. Bladder is decompressed. Stomach/Bowel: Normal stomach and duodenum. No dilated loops of small bowel. Normal appendix is visualized on axial image 63 series 2. Extensive colonic diverticulosis colon is decompressed. Mild wall  thickening and free fluid in the pelvis, suggestive of possible acute diverticulitis. Vascular/Lymphatic: Aortic atherosclerosis. No enlarged abdominal or pelvic lymph nodes. Reproductive: Uterus and bilateral adnexa are unremarkable. Other: No abdominal wall hernia. Musculoskeletal: Multilevel lumbar spondylosis, worst at L4-5, where there is severe spinal canal stenosis. IMPRESSION: 1. Extensive colonic diverticulosis with mild wall thickening and free fluid in the pelvis, suggestive of possible acute diverticulitis. No evidence of perforation or abscess. 2. Cholelithiasis with mild gallbladder wall thickening. No pericholecystic fluid or fat stranding. If there is clinical concern for acute cholecystitis, consider further evaluation with right upper quadrant ultrasound. 3. Small bilateral pleural effusions with adjacent atelectasis in the lung bases. 4. Multilevel lumbar spondylosis, worst at L4-5, where there is severe spinal canal stenosis. Aortic Atherosclerosis (ICD10-I70.0). Electronically Signed   By: Orvan Falconer M.D.   On: 01/09/2023 09:41   VAS [place] PAD ABI  Result Date: 12/23/2022  LOWER EXTREMITY DOPPLER STUDY Patient Name:  INABELLE ENGEBRETSON  Date of Exam:   12/23/2022 Medical Rec #: [id number]           Accession #:    [id number] Date of Birth: 07/21/1940           Patient Gender: F Patient Age:   81 years Exam Location:  [facility] Procedure:  VAS [place] ABI WITH/WO TBI Referring Phys: Barnie Del --------------------------------------------------------------------------------  Indications: Claudication, rest pain, and peripheral artery disease. 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 Other Factors: ESRD.  Comparison Study: 01/08/20 ABI Performing Technologist: Lowell Guitar RVT, RDMS  Examination Guidelines: A complete evaluation includes at minimum, Doppler  waveform signals and systolic blood pressure reading at the level of bilateral brachial, anterior tibial, and posterior tibial arteries, when vessel segments are accessible. Bilateral testing is considered an integral part of a complete examination. Photoelectric Plethysmograph (PPG) waveforms and toe systolic pressure readings are included as required and additional duplex testing as needed. Limited examinations for reoccurring indications may be performed as noted.  ABI Findings: +---------+------------------+-----+-----------+-------------------------------+ Right    Rt Pressure (mmHg)IndexWaveform   Comment                         +---------+------------------+-----+-----------+-------------------------------+ PTA      199               1.06 multiphasic                                +---------+------------------+-----+-----------+-------------------------------+ DP       215               1.15 monophasic                                 +---------+------------------+-----+-----------+-------------------------------+ Great Toe                       Abnormal   2nd Digit. Too small to obtain                                             pressure                        +---------+------------------+-----+-----------+-------------------------------+ +---------+------------------+-----+----------+-------+ Left     Lt Pressure (mmHg)IndexWaveform  Comment +---------+------------------+-----+----------+-------+ Brachial 187                                      +---------+------------------+-----+----------+-------+ PTA      183               0.98 monophasic        +---------+------------------+-----+----------+-------+ DP       191               1.02 monophasic        +---------+------------------+-----+----------+-------+ Great Toe115               0.61 Abnormal          +---------+------------------+-----+----------+-------+  +-------+-----------+-----------+------------+------------+ ABI/TBIToday's ABIToday's TBIPrevious ABIPrevious TBI +-------+-----------+-----------+------------+------------+ Right  1.15       amputated  0.92        0.64         +-------+-----------+-----------+------------+------------+ Left   1.02  0.61       0.66        0.48         +-------+-----------+-----------+------------+------------+  Arterial wall calcification precludes accurate ankle pressures and ABIs. PPG tracings display appropriate pulsatility. Bilateral ABIs appear increased. Left TBIs appear increased.  Summary: Right: Resting right ankle-brachial index is within normal range. Although ankle brachial indices are within normal limits (0.95-1.29), arterial Doppler waveforms at the ankle suggest some component of arterial occlusive disease.  Left: Resting left ankle-brachial index is within normal range. The left toe-brachial index is abnormal. Although ankle brachial indices are within normal limits (0.95-1.29), arterial Doppler waveforms at the ankle suggest some component of arterial occlusive disease. *See table(s) above for measurements and observations.  Electronically signed by Sherald Hess MD on 12/23/2022 at 11:59:26 AM.    Final      Discharge Exam: Vitals:   01/10/23 0412 01/10/23 0940  BP: (!) 154/85 (!) 182/69  Pulse: 83 88  Resp: 18 19  Temp: 98.3 F (36.8 C) 98.3 F (36.8 C)  SpO2: 99% 100%   Vitals:   01/09/23 1916 01/09/23 2047 01/10/23 0412 01/10/23 0940  BP: 135/65 (!) 162/57 (!) 154/85 (!) 182/69  Pulse: 88 82 83 88  Resp: 16 18 18 19   Temp:  98.5 F (36.9 C) 98.3 F (36.8 C) 98.3 F (36.8 C)  TempSrc:  Oral  Oral  SpO2: 95% 100% 99% 100%  Weight:  73.2 kg    Height:        General: Pt is alert, awake, not in acute distress Cardiovascular: RRR, S1/S2 +, no rubs, no gallops Respiratory: CTA bilaterally, no wheezing, no rhonchi Abdominal: Soft, NT, ND, bowel sounds  + Extremities: no edema, no cyanosis    The results of significant diagnostics from this hospitalization (including imaging, microbiology, ancillary and laboratory) are listed below for reference.     Microbiology: No results found for this or any previous visit (from the past 240 hour(s)).   Labs: BNP (last 3 results) Recent Labs    05/09/22 0659 06/09/22 2000  BNP 2,370.0* 1,518.0*   Basic Metabolic Panel: Recent Labs  Lab 01/09/23 0614 01/10/23 0416  NA 137 135  K 3.2* 3.3*  CL 98 98  CO2 27 25  GLUCOSE 182* 129*  BUN 29* 32*  CREATININE 6.39* 7.29*  CALCIUM 8.2* 8.2*  MG  --  1.8   Liver Function Tests: Recent Labs  Lab 01/09/23 0614  AST 18  ALT 11  ALKPHOS 71  BILITOT 0.7  PROT 7.6  ALBUMIN 3.2*   No results for input(s): "LIPASE", "AMYLASE" in the last 168 hours. No results for input(s): "AMMONIA" in the last 168 hours. CBC: Recent Labs  Lab 01/09/23 0614 01/09/23 1156 01/09/23 1754 01/10/23 0009 01/10/23 0416  WBC 12.1*  --   --   --  9.4  HGB 10.0* 10.0* 10.4* 9.9* 9.9*  HCT 30.5* 31.5* 32.1* 30.5* 29.9*  MCV 91.0  --   --   --  89.5  PLT 151  --   --   --  148*   Cardiac Enzymes: No results for input(s): "CKTOTAL", "CKMB", "CKMBINDEX", "TROPONINI" in the last 168 hours. BNP: Invalid input(s): "POCBNP" CBG: Recent Labs  Lab 01/09/23 1610 01/10/23 0039 01/10/23 0413 01/10/23 0743  GLUCAP 256* 97 150* 155*   D-Dimer No results for input(s): "DDIMER" in the last 72 hours. Hgb A1c Recent Labs    01/09/23 1051  HGBA1C 10.4*   Lipid Profile No  results for input(s): "CHOL", "HDL", "LDLCALC", "TRIG", "CHOLHDL", "LDLDIRECT" in the last 72 hours. Thyroid function studies No results for input(s): "TSH", "T4TOTAL", "T3FREE", "THYROIDAB" in the last 72 hours.  Invalid input(s): "FREET3" Anemia work up No results for input(s): "VITAMINB12", "FOLATE", "FERRITIN", "TIBC", "IRON", "RETICCTPCT" in the last 72 hours. Urinalysis     Component Value Date/Time   COLORURINE YELLOW 10/15/2017 2244   APPEARANCEUR HAZY (A) 10/15/2017 2244   LABSPEC 1.015 10/15/2017 2244   PHURINE 7.0 10/15/2017 2244   GLUCOSEU >=500 (A) 10/15/2017 2244   HGBUR SMALL (A) 10/15/2017 2244   BILIRUBINUR NEGATIVE 10/15/2017 2244   KETONESUR NEGATIVE 10/15/2017 2244   PROTEINUR 100 (A) 10/15/2017 2244   UROBILINOGEN 0.2 11/19/2014 0909   NITRITE NEGATIVE 10/15/2017 2244   LEUKOCYTESUR MODERATE (A) 10/15/2017 2244   Sepsis Labs Recent Labs  Lab 01/09/23 0614 01/10/23 0416  WBC 12.1* 9.4   Microbiology No results found for this or any previous visit (from the past 240 hour(s)).   Time coordinating discharge: 35 minutes  SIGNED:   Erick Blinks, DO [facility] 01/10/2023, 11:02 AM  If 7PM-7AM, please contact night-coverage [email or web address]

## 2023-01-10 NOTE — Progress Notes (Signed)
   HEMODIALYSIS TREATMENT NOTE:  Uneventful 3.75 hour heparin-free treatment completed using right upper arm AVF (15g/antegrade). Goal met: 1.5 liters removed without interruption in UF.  All blood was returned and hemostasis was achieved in 20 minutes.   Post-treatment:  01/10/23 1700  Vitals  Temp 98 F (36.7 C)  Temp Source Oral  BP (!) 170/71  MAP (mmHg) 101  BP Location Left Arm  BP Method Automatic  Patient Position (if appropriate) Sitting  Pulse Rate 74  Pulse Rate Source Monitor  Resp 18  Oxygen Therapy  SpO2 100 %  O2 Device Room Air  Post Treatment  Dialyzer Clearance Lightly streaked  Duration of HD Treatment -hour(s) 3.75 hour(s)  Hemodialysis Intake (mL) 0 mL  Liters Processed 84.5  Fluid Removed (mL) 1500 mL  Tolerated HD Treatment Yes  Post-Hemodialysis Comments Goal met.  AVG/AVF Arterial Site Held (minutes) 7 minutes  AVG/AVF Venous Site Held (minutes) 7 minutes  Fistula / Graft Right Upper arm Arteriovenous fistula  Placement Date/Time: 01/26/14 0810   Orientation: Right  Access Location: Upper arm  Access Type: Arteriovenous fistula  Site Condition No complications  Fistula / Graft Assessment Thrill;Bruit  Status Patent    Arman Filter, RN AP [facility]

## 2023-01-12 DIAGNOSIS — N186 End stage renal disease: Secondary | ICD-10-CM | POA: Diagnosis not present

## 2023-01-12 DIAGNOSIS — Z992 Dependence on renal dialysis: Secondary | ICD-10-CM | POA: Diagnosis not present

## 2023-01-14 ENCOUNTER — Encounter (INDEPENDENT_AMBULATORY_CARE_PROVIDER_SITE_OTHER): Payer: Self-pay

## 2023-01-14 DIAGNOSIS — Z992 Dependence on renal dialysis: Secondary | ICD-10-CM | POA: Diagnosis not present

## 2023-01-14 DIAGNOSIS — N186 End stage renal disease: Secondary | ICD-10-CM | POA: Diagnosis not present

## 2023-01-16 DIAGNOSIS — Z992 Dependence on renal dialysis: Secondary | ICD-10-CM | POA: Diagnosis not present

## 2023-01-16 DIAGNOSIS — N186 End stage renal disease: Secondary | ICD-10-CM | POA: Diagnosis not present

## 2023-01-16 DIAGNOSIS — E1165 Type 2 diabetes mellitus with hyperglycemia: Secondary | ICD-10-CM | POA: Diagnosis not present

## 2023-01-16 DIAGNOSIS — I10 Essential (primary) hypertension: Secondary | ICD-10-CM | POA: Diagnosis not present

## 2023-01-16 DIAGNOSIS — K5700 Diverticulitis of small intestine with perforation and abscess without bleeding: Secondary | ICD-10-CM | POA: Diagnosis not present

## 2023-01-19 DIAGNOSIS — Z992 Dependence on renal dialysis: Secondary | ICD-10-CM | POA: Diagnosis not present

## 2023-01-19 DIAGNOSIS — N186 End stage renal disease: Secondary | ICD-10-CM | POA: Diagnosis not present

## 2023-01-21 DIAGNOSIS — N186 End stage renal disease: Secondary | ICD-10-CM | POA: Diagnosis not present

## 2023-01-21 DIAGNOSIS — Z992 Dependence on renal dialysis: Secondary | ICD-10-CM | POA: Diagnosis not present

## 2023-01-22 ENCOUNTER — Ambulatory Visit: Payer: 59 | Admitting: Orthopedic Surgery

## 2023-01-23 DIAGNOSIS — N186 End stage renal disease: Secondary | ICD-10-CM | POA: Diagnosis not present

## 2023-01-23 DIAGNOSIS — Z992 Dependence on renal dialysis: Secondary | ICD-10-CM | POA: Diagnosis not present

## 2023-01-26 DIAGNOSIS — Z992 Dependence on renal dialysis: Secondary | ICD-10-CM | POA: Diagnosis not present

## 2023-01-26 DIAGNOSIS — N186 End stage renal disease: Secondary | ICD-10-CM | POA: Diagnosis not present

## 2023-01-27 DIAGNOSIS — T82858A Stenosis of vascular prosthetic devices, implants and grafts, initial encounter: Secondary | ICD-10-CM | POA: Diagnosis not present

## 2023-01-27 DIAGNOSIS — Z992 Dependence on renal dialysis: Secondary | ICD-10-CM | POA: Diagnosis not present

## 2023-01-27 DIAGNOSIS — I871 Compression of vein: Secondary | ICD-10-CM | POA: Diagnosis not present

## 2023-01-27 DIAGNOSIS — Z794 Long term (current) use of insulin: Secondary | ICD-10-CM | POA: Diagnosis not present

## 2023-01-27 DIAGNOSIS — N186 End stage renal disease: Secondary | ICD-10-CM | POA: Diagnosis not present

## 2023-01-27 DIAGNOSIS — E1122 Type 2 diabetes mellitus with diabetic chronic kidney disease: Secondary | ICD-10-CM | POA: Diagnosis not present

## 2023-01-28 DIAGNOSIS — N186 End stage renal disease: Secondary | ICD-10-CM | POA: Diagnosis not present

## 2023-01-28 DIAGNOSIS — Z992 Dependence on renal dialysis: Secondary | ICD-10-CM | POA: Diagnosis not present

## 2023-01-28 NOTE — Patient Instructions (Signed)
[name]  01/28/2023     @PREFPERIOPPHARMACY @   Your procedure is scheduled on  02/03/2023.   Report to [facility] at  0600 A.M.   Call this number if you have problems the morning of surgery:  [phone]  If you experience any cold or flu symptoms such as cough, fever, chills, shortness of breath, etc. between now and your scheduled surgery, please notify us at the above number.   Remember:  Follow the diet instructions given to you by the office.      Use your inhaler before you come and bring your rescue inhaler with you.     Take these medicines the morning of surgery with A SIP OF WATER                               labetolol, omeprazole.     Do not wear jewelry, make-up or nail polish, including gel polish,  artificial nails, or any other type of covering on natural nails (fingers and  toes).  Do not wear lotions, powders, or perfumes, or deodorant.  Do not shave 48 hours prior to surgery.  Men may shave face and neck.  Do not bring valuables to the hospital.  [facility] is not responsible for any belongings or valuables.  Contacts, dentures or bridgework may not be worn into surgery.  Leave your suitcase in the car.  After surgery it may be brought to your room.  For patients admitted to the hospital, discharge time will be determined by your treatment team.  Patients discharged the day of surgery will not be allowed to drive home and must have someone with them for 24 hours.    Special instructions:   DO NOT smoke tobacco or vape for 24 hours before your procedure.  Please read over the following fact sheets that you were given. Anesthesia Post-op Instructions and Care and Recovery After Surgery         Upper Endoscopy, Adult, Care After After the procedure, it is common to have a sore throat. It is also common to have: Mild stomach pain or discomfort. Bloating. Nausea. Follow these instructions at home: The instructions below may  help you care for yourself at home. Your health care provider may give you more instructions. If you have questions, ask your health care provider. If you were given a sedative during the procedure, it can affect you for several hours. Do not drive or operate machinery until your health care provider says that it is safe. If you will be going home right after the procedure, plan to have a responsible adult: Take you home from the hospital or clinic. You will not be allowed to drive. Care for you for the time you are told. Follow instructions from your health care provider about what you may eat and drink. Return to your normal activities as told by your health care provider. Ask your health care provider what activities are safe for you. Take over-the-counter and prescription medicines only as told by your health care provider. Contact a health care provider if you: Have a sore throat that lasts longer than one day. Have trouble swallowing. Have a fever. Get help right away if you: Vomit blood or your vomit looks like coffee grounds. Have bloody, black, or tarry stools. Have a very bad sore throat or you cannot swallow. Have difficulty breathing or very bad pain in your  chest or abdomen. These symptoms may be an emergency. Get help right away. Call 911. Do not wait to see if the symptoms will go away. Do not drive yourself to the hospital. Summary After the procedure, it is common to have a sore throat, mild stomach discomfort, bloating, and nausea. If you were given a sedative during the procedure, it can affect you for several hours. Do not drive until your health care provider says that it is safe. Follow instructions from your health care provider about what you may eat and drink. Return to your normal activities as told by your health care provider. This information is not intended to replace advice given to you by your health care provider. Make sure you discuss any questions you have  with your health care provider. Document Revised: 09/11/2021 Document Reviewed: 09/11/2021 Elsevier Patient Education  2024 [employer] Esophageal Dilatation Esophageal dilatation, also called esophageal dilation, is a procedure to widen or open a blocked or narrowed part of the esophagus. The esophagus is the part of the body that moves food and liquid from the mouth to the stomach. You may need this procedure if: You have a buildup of scar tissue in your esophagus that makes it difficult, painful, or impossible to swallow. This can be caused by gastroesophageal reflux disease (GERD). You have cancer of the esophagus. There is a problem with how food moves through your esophagus. In some cases, you may need this procedure repeated at a later time to dilate the esophagus gradually. Tell a health care provider about: Any allergies you have. All medicines you are taking, including vitamins, herbs, eye drops, creams, and over-the-counter medicines. Any problems you or family members have had with anesthetic medicines. Any blood disorders you have. Any surgeries you have had. Any medical conditions you have. Any antibiotic medicines you are required to take before dental procedures. Whether you are pregnant or may be pregnant. What are the risks? Generally, this is a safe procedure. However, problems may occur, including: Bleeding due to a tear in the lining of the esophagus. A hole, or perforation, in the esophagus. What happens before the procedure? Ask your health care provider about: Changing or stopping your regular medicines. This is especially important if you are taking diabetes medicines or blood thinners. Taking medicines such as aspirin and ibuprofen. These medicines can thin your blood. Do not take these medicines unless your health care provider tells you to take them. Taking over-the-counter medicines, vitamins, herbs, and supplements. Follow instructions from your health care  provider about eating or drinking restrictions. Plan to have a responsible adult take you home from the hospital or clinic. Plan to have a responsible adult care for you for the time you are told after you leave the hospital or clinic. This is important. What happens during the procedure? You may be given a medicine to help you relax (sedative). A numbing medicine may be sprayed into the back of your throat, or you may gargle the medicine. Your health care provider may perform the dilatation using various surgical instruments, such as: Simple dilators. This instrument is carefully placed in the esophagus to stretch it. Guided wire bougies. This involves using an endoscope to insert a wire into the esophagus. A dilator is passed over this wire to enlarge the esophagus. Then the wire is removed. Balloon dilators. An endoscope with a small balloon is inserted into the esophagus. The balloon is inflated to stretch the esophagus and open it up. The procedure may vary  among health care providers and hospitals. What can I expect after the procedure? Your blood pressure, heart rate, breathing rate, and blood oxygen level will be monitored until you leave the hospital or clinic. Your throat may feel slightly sore and numb. This will get better over time. You will not be allowed to eat or drink until your throat is no longer numb. When you are able to drink, urinate, and sit on the edge of the bed without nausea or dizziness, you may be able to return home. Follow these instructions at home: Take over-the-counter and prescription medicines only as told by your health care provider. If you were given a sedative during the procedure, it can affect you for several hours. Do not drive or operate machinery until your health care provider says that it is safe. Plan to have a responsible adult care for you for the time you are told. This is important. Follow instructions from your health care provider about any  eating or drinking restrictions. Do not use any products that contain nicotine or tobacco, such as cigarettes, e-cigarettes, and chewing tobacco. If you need help quitting, ask your health care provider. Keep all follow-up visits. This is important. Contact a health care provider if: You have a fever. You have pain that is not relieved by medicine. Get help right away if: You have chest pain. You have trouble breathing. You have trouble swallowing. You vomit blood. You have black, tarry, or bloody stools. These symptoms may represent a serious problem that is an emergency. Do not wait to see if the symptoms will go away. Get medical help right away. Call your local emergency services (911 in the [place] Do not drive yourself to the hospital. Summary Esophageal dilatation, also called esophageal dilation, is a procedure to widen or open a blocked or narrowed part of the esophagus. Plan to have a responsible adult take you home from the hospital or clinic. For this procedure, a numbing medicine may be sprayed into the back of your throat, or you may gargle the medicine. Do not drive or operate machinery until your health care provider says that it is safe. This information is not intended to replace advice given to you by your health care provider. Make sure you discuss any questions you have with your health care provider. Document Revised: 10/19/2019 Document Reviewed: 10/19/2019 Elsevier Patient Education  2024 Elsevier Inc. Monitored Anesthesia Care, Care After The following information offers guidance on how to care for yourself after your procedure. Your health care provider may also give you more specific instructions. If you have problems or questions, contact your health care provider. What can I expect after the procedure? After the procedure, it is common to have: Tiredness. Little or no memory about what happened during or after the procedure. Impaired judgment when it comes to  making decisions. Nausea or vomiting. Some trouble with balance. Follow these instructions at home: For the time period you were told by your health care provider:  Rest. Do not participate in activities where you could fall or become injured. Do not drive or use machinery. Do not drink alcohol. Do not take sleeping pills or medicines that cause drowsiness. Do not make important decisions or sign legal documents. Do not take care of children on your own. Medicines Take over-the-counter and prescription medicines only as told by your health care provider. If you were prescribed antibiotics, take them as told by your health care provider. Do not stop using the antibiotic even if  you start to feel better. Eating and drinking Follow instructions from your health care provider about what you may eat and drink. Drink enough fluid to keep your urine pale yellow. If you vomit: Drink clear fluids slowly and in small amounts as you are able. Clear fluids include water, ice chips, low-calorie sports drinks, and fruit juice that has water added to it (diluted fruit juice). Eat light and bland foods in small amounts as you are able. These foods include bananas, applesauce, rice, lean meats, toast, and crackers. General instructions  Have a responsible adult stay with you for the time you are told. It is important to have someone help care for you until you are awake and alert. If you have sleep apnea, surgery and some medicines can increase your risk for breathing problems. Follow instructions from your health care provider about wearing your sleep device: When you are sleeping. This includes during daytime naps. While taking prescription pain medicines, sleeping medicines, or medicines that make you drowsy. Do not use any products that contain nicotine or tobacco. These products include cigarettes, chewing tobacco, and vaping devices, such as e-cigarettes. If you need help quitting, ask your health  care provider. Contact a health care provider if: You feel nauseous or vomit every time you eat or drink. You feel light-headed. You are still sleepy or having trouble with balance after 24 hours. You get a rash. You have a fever. You have redness or swelling around the IV site. Get help right away if: You have trouble breathing. You have new confusion after you get home. These symptoms may be an emergency. Get help right away. Call 911. Do not wait to see if the symptoms will go away. Do not drive yourself to the hospital. This information is not intended to replace advice given to you by your health care provider. Make sure you discuss any questions you have with your health care provider. Document Revised: 10/28/2021 Document Reviewed: 10/28/2021 Elsevier Patient Education  2024 [employer].

## 2023-01-29 ENCOUNTER — Other Ambulatory Visit: Payer: Self-pay | Admitting: Oncology

## 2023-01-29 ENCOUNTER — Encounter (HOSPITAL_COMMUNITY)
Admission: RE | Admit: 2023-01-29 | Discharge: 2023-01-29 | Disposition: A | Discharge status: Home / Self Care | Payer: 59 | Source: Ambulatory Visit | Attending: Gastroenterology | Admitting: Gastroenterology

## 2023-01-29 ENCOUNTER — Other Ambulatory Visit: Payer: Self-pay

## 2023-01-29 ENCOUNTER — Encounter: Payer: Self-pay | Admitting: Oncology

## 2023-01-29 ENCOUNTER — Inpatient Hospital Stay: Payer: 59

## 2023-01-29 ENCOUNTER — Inpatient Hospital Stay: Payer: 59 | Attending: Oncology | Admitting: Oncology

## 2023-01-29 ENCOUNTER — Encounter (HOSPITAL_COMMUNITY): Payer: Self-pay

## 2023-01-29 VITALS — BP 148/66 | HR 77 | Temp 98.0°F | Resp 18 | Ht 67.0 in | Wt 159.6 lb

## 2023-01-29 DIAGNOSIS — R7989 Other specified abnormal findings of blood chemistry: Secondary | ICD-10-CM

## 2023-01-29 DIAGNOSIS — I120 Hypertensive chronic kidney disease with stage 5 chronic kidney disease or end stage renal disease: Secondary | ICD-10-CM | POA: Diagnosis not present

## 2023-01-29 DIAGNOSIS — Z79899 Other long term (current) drug therapy: Secondary | ICD-10-CM | POA: Diagnosis not present

## 2023-01-29 DIAGNOSIS — R5383 Other fatigue: Secondary | ICD-10-CM | POA: Insufficient documentation

## 2023-01-29 DIAGNOSIS — E1122 Type 2 diabetes mellitus with diabetic chronic kidney disease: Secondary | ICD-10-CM | POA: Diagnosis not present

## 2023-01-29 DIAGNOSIS — Z992 Dependence on renal dialysis: Secondary | ICD-10-CM | POA: Diagnosis not present

## 2023-01-29 DIAGNOSIS — Z888 Allergy status to other drugs, medicaments and biological substances status: Secondary | ICD-10-CM | POA: Insufficient documentation

## 2023-01-29 DIAGNOSIS — Z794 Long term (current) use of insulin: Secondary | ICD-10-CM | POA: Insufficient documentation

## 2023-01-29 DIAGNOSIS — M79604 Pain in right leg: Secondary | ICD-10-CM | POA: Insufficient documentation

## 2023-01-29 DIAGNOSIS — Z01818 Encounter for other preprocedural examination: Secondary | ICD-10-CM | POA: Insufficient documentation

## 2023-01-29 DIAGNOSIS — D631 Anemia in chronic kidney disease: Secondary | ICD-10-CM | POA: Diagnosis not present

## 2023-01-29 DIAGNOSIS — J45909 Unspecified asthma, uncomplicated: Secondary | ICD-10-CM | POA: Diagnosis not present

## 2023-01-29 DIAGNOSIS — N186 End stage renal disease: Secondary | ICD-10-CM | POA: Insufficient documentation

## 2023-01-29 DIAGNOSIS — D3A8 Other benign neuroendocrine tumors: Secondary | ICD-10-CM | POA: Insufficient documentation

## 2023-01-29 LAB — CBC WITH DIFFERENTIAL/PLATELET
Abs Immature Granulocytes: 0.05 10*3/uL (ref 0.00–0.07)
Basophils Absolute: 0 10*3/uL (ref 0.0–0.1)
Basophils Relative: 0 %
Eosinophils Absolute: 0.3 10*3/uL (ref 0.0–0.5)
Eosinophils Relative: 3 %
HCT: 33.2 % — ABNORMAL LOW (ref 36.0–46.0)
Hemoglobin: 10.6 g/dL — ABNORMAL LOW (ref 12.0–15.0)
Immature Granulocytes: 1 %
Lymphocytes Relative: 12 %
Lymphs Abs: 1 10*3/uL (ref 0.7–4.0)
MCH: 30 pg (ref 26.0–34.0)
MCHC: 31.9 g/dL (ref 30.0–36.0)
MCV: 94.1 fL (ref 80.0–100.0)
Monocytes Absolute: 0.9 10*3/uL (ref 0.1–1.0)
Monocytes Relative: 12 %
Neutro Abs: 5.7 10*3/uL (ref 1.7–7.7)
Neutrophils Relative %: 72 %
Platelets: 135 10*3/uL — ABNORMAL LOW (ref 150–400)
RBC: 3.53 MIL/uL — ABNORMAL LOW (ref 3.87–5.11)
RDW: 19.9 % — ABNORMAL HIGH (ref 11.5–15.5)
WBC: 7.9 10*3/uL (ref 4.0–10.5)
nRBC: 0 % (ref 0.0–0.2)

## 2023-01-29 LAB — BASIC METABOLIC PANEL
Anion gap: 14 (ref 5–15)
BUN: 16 mg/dL (ref 8–23)
CO2: 27 mmol/L (ref 22–32)
Calcium: 8.3 mg/dL — ABNORMAL LOW (ref 8.9–10.3)
Chloride: 98 mmol/L (ref 98–111)
Creatinine, Ser: 3.79 mg/dL — ABNORMAL HIGH (ref 0.44–1.00)
GFR, Estimated: 11 mL/min — ABNORMAL LOW (ref >60)
Glucose, Bld: 137 mg/dL — ABNORMAL HIGH (ref 70–99)
Potassium: 3.4 mmol/L — ABNORMAL LOW (ref 3.5–5.1)
Sodium: 139 mmol/L (ref 135–145)

## 2023-01-29 LAB — IRON AND TIBC
Iron: 44 ug/dL (ref 28–170)
Saturation Ratios: 27 % (ref 10.4–31.8)
TIBC: 161 ug/dL — ABNORMAL LOW (ref 250–450)
UIBC: 117 ug/dL

## 2023-01-29 LAB — FERRITIN: Ferritin: 880 ng/mL — ABNORMAL HIGH (ref 11–307)

## 2023-01-29 NOTE — Assessment & Plan Note (Signed)
Patient has anemia of chronic disease with a hemoglobin of 9.9. She does not meet criteria for EPO supplementation at this time Being managed by nephrologist

## 2023-01-29 NOTE — Assessment & Plan Note (Signed)
Last ferritin on chart is from February 2024 of 1365 She received a blood transfusion in January for hemoglobin of 7 Her iron and % sat are within normal limits, also has normocytic anemia-leans more towards chronic disease Hemochromatosis testing was negative Her ferritin level is not too high that we will be worried about cardiomyopathy or liver damage at this time We will recheck labs today RTC in 6 months

## 2023-01-29 NOTE — Progress Notes (Signed)
[facility] HEMATOLOGY NEW VISIT  Mirna Mires, MD  REASON FOR REFERRAL: Elevated ferritin  SUMMARY OF HEMATOLOGIC HISTORY: Lab Results  Component Value Date   IRON 49 08/05/2022   TIBC 195 (L) 08/05/2022   FERRITIN 1,365 (H) 08/05/2022     HISTORY OF PRESENT ILLNESS: [name] 82 y.o. female referred for elevated ferritin. She is accompanied by her daughter today.  She has a past medical history of ESRD on dialysis for 12 years, diabetes, asthma, hypertension, neuroendocrine tumor of duodenum presenting today referred by GI for elevated ferritin and possible hemochromatosis. Patient reports pain on the entire right side extending from her arms to her legs will be seeing a pain physician at the end of the month. She also reports that she is tired all the time and associates it with dialysis.  She denies abdominal pain, nausea, vomiting, loss of appetite, weight loss.  She reports no discoloration of the skin.  She seems well overall and at baseline.  She lives with her 2 sons and walks with the help of a walker.  She is not very sure she is getting iron during her dialysis.  She was admitted in the hospital in January and during that time she received 1 unit of blood. She is a lifetime non-smoker, nonalcoholic.  No family history of blood conditions.   We discussed that her elevated ferritin is likely due to chronic conditions and blood transfusion in January.  Hemochromatosis testing was done which was negative.  She also has anemia of chronic disease which does not meet criteria for EPO at this time.  Her neuroendocrine tumor of duodenum is being managed appropriately by GI at this time.   I have reviewed the past medical history, past surgical history, social history and family history with the patient   ALLERGIES:  is allergic to [place] [zolpidem] and reglan [metoclopramide].  MEDICATIONS:  Current Outpatient Medications  Medication  Sig Dispense Refill   acetaminophen (TYLENOL) 500 MG tablet Take 500 mg by mouth at bedtime.     albuterol (VENTOLIN HFA) 108 (90 Base) MCG/ACT inhaler Inhale 2 puffs into the lungs every 6 (six) hours as needed for wheezing or shortness of breath. 1 each 0   aspirin EC 81 MG tablet Take 1 tablet (81 mg total) by mouth daily. Swallow whole. 30 tablet 2   cyanocobalamin (,VITAMIN B-12,) 1000 MCG/ML injection Inject 1,000 mcg into the muscle every 30 (thirty) days.  11   diclofenac Sodium (VOLTAREN) 1 % GEL Apply 4 g topically 4 (four) times daily as needed. Apply to bilateral knees 100 g 0   HUMULIN 70/30 (70-30) 100 UNIT/ML injection Inject 5-10 Units into the skin See admin instructions. Inject 10 units into the skin every morning and 5 units at night.     labetalol (NORMODYNE) 200 MG tablet Take 200 mg by mouth 2 (two) times daily.     multivitamin (RENA-VIT) TABS tablet Take 1 tablet by mouth at bedtime. 30 tablet 0   omeprazole (PRILOSEC) 40 MG capsule TAKE ONE CAPSULE BY MOUTH TWICE DAILY BEFORE A meal (Patient taking differently: Take 40 mg by mouth in the morning and at bedtime.) 180 capsule 3   No current facility-administered medications for this visit.     REVIEW OF SYSTEMS:   Constitutional: Denies fevers, chills or night sweats Eyes: Denies blurriness of vision Ears, nose, mouth, throat, and face: Denies mucositis or sore throat Respiratory: Denies cough, dyspnea or wheezes  Cardiovascular: Denies palpitation, chest discomfort or lower extremity swelling Gastrointestinal:  Denies nausea, heartburn or change in bowel habits Skin: Denies abnormal skin rashes Lymphatics: Denies new lymphadenopathy or easy bruising Neurological:Denies numbness, tingling or new weaknesses Behavioral/Psych: Mood is stable, no new changes  All other systems were reviewed with the patient and are negative.  PHYSICAL EXAMINATION:   Vitals:   01/29/23 0957  BP: (!) 148/66  Pulse: 77  Resp: 18   Temp: 98 F (36.7 C)  SpO2: 100%    GENERAL:alert, no distress and comfortable SKIN: skin color, texture, turgor are normal, no rashes or significant lesions LUNGS: clear to auscultation and percussion with normal breathing effort HEART: regular rate & rhythm and no murmurs and no lower extremity edema ABDOMEN:abdomen soft, non-tender and normal bowel sounds Musculoskeletal:no cyanosis of digits and no clubbing . AV fistula in right arm NEURO: alert & oriented x 3 with fluent speech, no focal motor/sensory deficits, in a wheelchair  LABORATORY DATA:  I have reviewed the data as listed  Lab Results  Component Value Date   WBC 9.4 01/10/2023   NEUTROABS 12.5 (H) 06/15/2022   HGB 9.9 (L) 01/10/2023   HCT 29.9 (L) 01/10/2023   MCV 89.5 01/10/2023   PLT 148 (L) 01/10/2023      Component Value Date/Time   NA 135 01/10/2023 0416   K 3.3 (L) 01/10/2023 0416   CL 98 01/10/2023 0416   CO2 25 01/10/2023 0416   GLUCOSE 129 (H) 01/10/2023 0416   BUN 32 (H) 01/10/2023 0416   CREATININE 7.29 (H) 01/10/2023 0416   CALCIUM 8.2 (L) 01/10/2023 0416   PROT 7.6 01/09/2023 0614   ALBUMIN 3.2 (L) 01/09/2023 0614   AST 18 01/09/2023 0614   ALT 11 01/09/2023 0614   ALKPHOS 71 01/09/2023 0614   BILITOT 0.7 01/09/2023 0614   GFRNONAA 5 (L) 01/10/2023 0416   GFRAA 5 (L) 01/16/2020 0650        Chemistry      Component Value Date/Time   NA 135 01/10/2023 0416   K 3.3 (L) 01/10/2023 0416   CL 98 01/10/2023 0416   CO2 25 01/10/2023 0416   BUN 32 (H) 01/10/2023 0416   CREATININE 7.29 (H) 01/10/2023 0416      Component Value Date/Time   CALCIUM 8.2 (L) 01/10/2023 0416   ALKPHOS 71 01/09/2023 0614   AST 18 01/09/2023 0614   ALT 11 01/09/2023 0614   BILITOT 0.7 01/09/2023 0614     Lab Results  Component Value Date   IRON 49 08/05/2022   TIBC 195 (L) 08/05/2022   FERRITIN 1,365 (H) 08/05/2022      RADIOGRAPHIC STUDIES: I have personally reviewed the radiology report of her  dotatate PET scan from last year.  ASSESSMENT & PLAN:   Patient is a 82 year old female with past medical history of ESRD on hemodialysis, neuroendocrine tumor of duodenum referred for elevated ferritin.  Elevated ferritin Last ferritin on chart is from February 2024 of 1365 She received a blood transfusion in January for hemoglobin of 7 Her iron and % sat are within normal limits, also has normocytic anemia-leans more towards chronic disease Hemochromatosis testing was negative Her ferritin level is not too high that we will be worried about cardiomyopathy or liver damage at this time We will recheck labs today RTC in 6 months    Anemia Patient has anemia of chronic disease with a hemoglobin of 9.9. 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) Patient has ESRD secondary to diabetes and is on hemodialysis for the past 12 years Tolerating dialysis very well Managed by nephrologist   Orders Placed This Encounter  Procedures   CBC with Differential    Standing Status:   Future    Number of Occurrences:   1    Standing Expiration Date:   01/29/2024   Ferritin    Standing Status:   Future    Number of Occurrences:   1    Standing Expiration Date:   01/29/2024   Iron and TIBC ([facility] ONLY)    Standing Status:   Future    Number of Occurrences:   1    Standing Expiration Date:   01/29/2024    The total time spent in the appointment was 60 minutes encounter with patients including review  of chart and various tests results, discussions about plan of care and coordination of care plan   All questions were answered. The patient knows to call the clinic with any problems, questions or concerns. No barriers to learning was detected.   Cindie Crumbly, MD 8/15/202410:43 AM

## 2023-01-29 NOTE — Assessment & Plan Note (Signed)
Patient has ESRD secondary to diabetes and is on hemodialysis for the past 12 years Tolerating dialysis very well Managed by nephrologist

## 2023-01-29 NOTE — Assessment & Plan Note (Signed)
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

## 2023-01-30 DIAGNOSIS — N186 End stage renal disease: Secondary | ICD-10-CM | POA: Diagnosis not present

## 2023-01-30 DIAGNOSIS — Z992 Dependence on renal dialysis: Secondary | ICD-10-CM | POA: Diagnosis not present

## 2023-02-02 DIAGNOSIS — N186 End stage renal disease: Secondary | ICD-10-CM | POA: Diagnosis not present

## 2023-02-02 DIAGNOSIS — Z992 Dependence on renal dialysis: Secondary | ICD-10-CM | POA: Diagnosis not present

## 2023-02-03 ENCOUNTER — Ambulatory Visit (HOSPITAL_BASED_OUTPATIENT_CLINIC_OR_DEPARTMENT_OTHER): Payer: 59 | Admitting: Anesthesiology

## 2023-02-03 ENCOUNTER — Other Ambulatory Visit: Payer: Self-pay

## 2023-02-03 ENCOUNTER — Ambulatory Visit (HOSPITAL_COMMUNITY): Payer: 59 | Admitting: Anesthesiology

## 2023-02-03 ENCOUNTER — Encounter (HOSPITAL_COMMUNITY): Payer: Self-pay | Admitting: Gastroenterology

## 2023-02-03 ENCOUNTER — Ambulatory Visit (HOSPITAL_COMMUNITY): Admission: RE | Admit: 2023-02-03 | Payer: 59 | Source: Home / Self Care | Admitting: Gastroenterology

## 2023-02-03 ENCOUNTER — Encounter (HOSPITAL_COMMUNITY)
Admission: RE | Disposition: A | Discharge status: Home / Self Care | Payer: Self-pay | Source: Home / Self Care | Attending: Gastroenterology

## 2023-02-03 DIAGNOSIS — K449 Diaphragmatic hernia without obstruction or gangrene: Secondary | ICD-10-CM | POA: Diagnosis not present

## 2023-02-03 DIAGNOSIS — I120 Hypertensive chronic kidney disease with stage 5 chronic kidney disease or end stage renal disease: Secondary | ICD-10-CM | POA: Insufficient documentation

## 2023-02-03 DIAGNOSIS — Z8673 Personal history of transient ischemic attack (TIA), and cerebral infarction without residual deficits: Secondary | ICD-10-CM | POA: Insufficient documentation

## 2023-02-03 DIAGNOSIS — E1151 Type 2 diabetes mellitus with diabetic peripheral angiopathy without gangrene: Secondary | ICD-10-CM | POA: Insufficient documentation

## 2023-02-03 DIAGNOSIS — K317 Polyp of stomach and duodenum: Secondary | ICD-10-CM | POA: Diagnosis not present

## 2023-02-03 DIAGNOSIS — Z992 Dependence on renal dialysis: Secondary | ICD-10-CM | POA: Diagnosis not present

## 2023-02-03 DIAGNOSIS — K2980 Duodenitis without bleeding: Secondary | ICD-10-CM | POA: Diagnosis not present

## 2023-02-03 DIAGNOSIS — D631 Anemia in chronic kidney disease: Secondary | ICD-10-CM | POA: Diagnosis not present

## 2023-02-03 DIAGNOSIS — N186 End stage renal disease: Secondary | ICD-10-CM | POA: Diagnosis not present

## 2023-02-03 DIAGNOSIS — Q399 Congenital malformation of esophagus, unspecified: Secondary | ICD-10-CM | POA: Insufficient documentation

## 2023-02-03 DIAGNOSIS — K3189 Other diseases of stomach and duodenum: Secondary | ICD-10-CM | POA: Diagnosis not present

## 2023-02-03 DIAGNOSIS — E1122 Type 2 diabetes mellitus with diabetic chronic kidney disease: Secondary | ICD-10-CM | POA: Insufficient documentation

## 2023-02-03 DIAGNOSIS — Z794 Long term (current) use of insulin: Secondary | ICD-10-CM | POA: Diagnosis not present

## 2023-02-03 DIAGNOSIS — R1310 Dysphagia, unspecified: Secondary | ICD-10-CM | POA: Diagnosis not present

## 2023-02-03 DIAGNOSIS — E119 Type 2 diabetes mellitus without complications: Secondary | ICD-10-CM | POA: Diagnosis not present

## 2023-02-03 DIAGNOSIS — R7989 Other specified abnormal findings of blood chemistry: Secondary | ICD-10-CM

## 2023-02-03 HISTORY — PX: BIOPSY: SHX5522

## 2023-02-03 HISTORY — PX: ESOPHAGOGASTRODUODENOSCOPY (EGD) WITH PROPOFOL: SHX5813

## 2023-02-03 HISTORY — PX: ESOPHAGEAL DILATION: SHX303

## 2023-02-03 LAB — POCT I-STAT, CHEM 8
BUN: 28 mg/dL — ABNORMAL HIGH (ref 8–23)
Calcium, Ion: 1.08 mmol/L — ABNORMAL LOW (ref 1.15–1.40)
Chloride: 97 mmol/L — ABNORMAL LOW (ref 98–111)
Creatinine, Ser: 4.3 mg/dL — ABNORMAL HIGH (ref 0.44–1.00)
Glucose, Bld: 287 mg/dL — ABNORMAL HIGH (ref 70–99)
HCT: 42 % (ref 36.0–46.0)
Hemoglobin: 14.3 g/dL (ref 12.0–15.0)
Potassium: 3.9 mmol/L (ref 3.5–5.1)
Sodium: 138 mmol/L (ref 135–145)
TCO2: 30 mmol/L (ref 22–32)

## 2023-02-03 SURGERY — ESOPHAGOGASTRODUODENOSCOPY (EGD) WITH PROPOFOL
Anesthesia: General

## 2023-02-03 MED ORDER — SODIUM CHLORIDE 0.9 % IV SOLN
INTRAVENOUS | Status: DC
Start: 2023-02-03 — End: 2023-02-03

## 2023-02-03 MED ORDER — LIDOCAINE HCL 1 % IJ SOLN
INTRAMUSCULAR | Status: DC | PRN
  Administered 2023-02-03: 50 mg via INTRADERMAL

## 2023-02-03 MED ORDER — PROPOFOL 10 MG/ML IV BOLUS
INTRAVENOUS | Status: DC | PRN
  Administered 2023-02-03: 20 mg via INTRAVENOUS
  Administered 2023-02-03: 50 mg via INTRAVENOUS
  Administered 2023-02-03 (×2): 20 mg via INTRAVENOUS
  Administered 2023-02-03: 50 mg via INTRAVENOUS
  Administered 2023-02-03: 20 mg via INTRAVENOUS

## 2023-02-03 MED ORDER — LACTATED RINGERS IV SOLN: INTRAVENOUS | Status: DC

## 2023-02-03 NOTE — Op Note (Signed)
[facility] Patient Name: [name] Procedure Date: 02/03/2023 7:07 AM MRN: [id number] Date of Birth: 11/11/1940 Attending MD: Katrinka Blazing , , [id number] CSN: [id number] Age: 82 Admit Type: Outpatient Procedure:                Upper GI endoscopy Indications:              Dysphagia Providers:                Katrinka Blazing, Angelica Ran, Dyann Ruddle, Elinor Parkinson Referring MD:              Medicines:                Monitored Anesthesia Care Complications:            No immediate complications. Estimated Blood Loss:     Estimated blood loss: none. Procedure:                Pre-Anesthesia Assessment:                           - Prior to the procedure, a History and Physical                            was performed, and patient medications, allergies                            and sensitivities were reviewed. The patient's                            tolerance of previous anesthesia was reviewed.                           - The risks and benefits of the procedure and the                            sedation options and risks were discussed with the                            patient. All questions were answered and informed                            consent was obtained.                           After obtaining informed consent, the endoscope was                            passed under direct vision. Throughout the                            procedure, the patient's blood pressure, pulse, and                            oxygen saturations were monitored continuously. The  [id number]-H190 ([id number]) scope was introduced through the                            mouth, and advanced to the second part of duodenum.                            The upper GI endoscopy was accomplished without                            difficulty. The patient tolerated the procedure                            well. Scope In: 7:46:41 AM Scope Out:  7:59:38 AM Total Procedure Duration: 0 hours 12 minutes 57 seconds  Findings:      The lower third of the esophagus was tortuous.      A 2 cm hiatal hernia was present.      No endoscopic abnormality was evident in the esophagus to explain the       patient's complaint of dysphagia. It was decided, however, to proceed       with dilation of the entire esophagus. A guidewire was placed and the       scope was withdrawn. Dilation was performed with a Savary dilator with       no resistance at 18 mm.      The stomach was normal.      A single subtle 3 mm mucosal nodule was found in the duodenal bulb, at       the site of previous NET EMR. Biopsies were taken with a cold forceps       for histology. Impression:               - Tortuous esophagus.                           - 2 cm hiatal hernia.                           - No endoscopic esophageal abnormality to explain                            patient's dysphagia. Esophagus dilated. Dilated.                           - Normal stomach.                           - Subtle mucosal nodule found in the duodenum.                            Biopsied. Moderate Sedation:      Per Anesthesia Care Recommendation:           - Discharge patient to home (ambulatory).                           - Resume previous diet.                           -  Await pathology results.                           - If persistent dysphagia, will proceed with                            esophagram and possible manometry. Procedure Code(s):        --- Professional ---                           [phone], Esophagogastroduodenoscopy, flexible,                            transoral; with insertion of guide wire followed by                            passage of dilator(s) through esophagus over guide                            wire                           43239, 59, Esophagogastroduodenoscopy, flexible,                            transoral; with biopsy, single or  multiple Diagnosis Code(s):        --- Professional ---                           Q39.9, Congenital malformation of esophagus,                            unspecified                           K44.9, Diaphragmatic hernia without obstruction or                            gangrene                           R13.10, Dysphagia, unspecified                           K31.89, Other diseases of stomach and duodenum CPT copyright 2022 [employer] Association. All rights reserved. The codes documented in this report are preliminary and upon coder review may  be revised to meet current compliance requirements. Katrinka Blazing, MD Katrinka Blazing,  02/03/2023 8:08:47 AM This report has been signed electronically. Number of Addenda: 0

## 2023-02-03 NOTE — Discharge Instructions (Signed)
You are being discharged to home.  Resume your previous diet.  We are waiting for your pathology results.  

## 2023-02-03 NOTE — Anesthesia Postprocedure Evaluation (Signed)
Anesthesia Post Note  Patient: [name]  Procedure(s) Performed: ESOPHAGOGASTRODUODENOSCOPY (EGD) WITH PROPOFOL ESOPHAGEAL DILATION BIOPSY  Patient location during evaluation: Short Stay Anesthesia Type: General Level of consciousness: awake and alert Pain management: pain level controlled Vital Signs Assessment: post-procedure vital signs reviewed and stable Respiratory status: spontaneous breathing Cardiovascular status: stable Postop Assessment: no apparent nausea or vomiting Anesthetic complications: no   No notable events documented.   Last Vitals:  Vitals:   02/03/23 0640 02/03/23 0807  BP: (!) 163/69 (!) 88/70  Pulse: 82 83  Resp: 16 19  Temp: 36.6 C 36.6 C  SpO2: 100% 100%    Last Pain:  Vitals:   02/03/23 0807  TempSrc: Axillary  PainSc: Asleep                 Leyton Magoon

## 2023-02-03 NOTE — Transfer of Care (Signed)
Immediate Anesthesia Transfer of Care Note  Patient: [name]  Procedure(s) Performed: ESOPHAGOGASTRODUODENOSCOPY (EGD) WITH PROPOFOL ESOPHAGEAL DILATION BIOPSY  Patient Location: Short Stay  Anesthesia Type:General  Level of Consciousness: sedated  Airway & Oxygen Therapy: Patient Spontanous Breathing  Post-op Assessment: Post -op Vital signs reviewed and stable  Post vital signs: Reviewed and stable  Last Vitals:  Vitals Value Taken Time  BP    Temp    Pulse    Resp    SpO2      Last Pain:  Vitals:   02/03/23 0741  TempSrc:   PainSc: 6       Patients Stated Pain Goal: 6 (02/03/23 0640)  Complications: No notable events documented.

## 2023-02-03 NOTE — H&P (Signed)
[name] is an 82 y.o. female.   Chief Complaint: dysphagia HPI: [name] is a 82 y.o. 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  Patient has presented dysphagia to solids and liquids. The patient denies having any nausea, vomiting, fever, chills, hematochezia, melena, hematemesis, abdominal distention, abdominal pain, diarrhea, jaundice, pruritus or weight loss.   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   Dialysis patient (HCC)    M, W, F   Fistula    R arm   GERD (gastroesophageal reflux disease)    Hypertension    Renal disorder    Shortness of breath    Stroke [facility])    right side weakness    Past Surgical History:  Procedure Laterality Date   AMPUTATION Right 01/13/2020   Procedure: AMPUTATION RIGHT FOOT 1ST RAY;  Surgeon: Nadara Mustard, MD;  Location: [facility] OR;  Service: Orthopedics;  Laterality: Right;   AV FISTULA PLACEMENT Right 09/08/2013   Procedure: CREATION OF RIGHT BRACHIAL CEPHALIC ARTERIOVENOUS FISTULA ;  Surgeon: Pryor Ochoa, MD;  Location: [facility] OR;  Service: Vascular;  Laterality: Right;   BASCILIC VEIN TRANSPOSITION Right 01/26/2014   Procedure: Right Arm BASILIC VEIN TRANSPOSITION;  Surgeon: Pryor Ochoa, MD;  Location: [facility] OR;  Service: Vascular;  Laterality: Right;   BIOPSY  09/10/2021   Procedure: BIOPSY;  Surgeon: Dolores Frame, MD;  Location: [facility] END[facility];  Service: Gastroenterology;;  gastric   BIOPSY  10/07/2021   Procedure: BIOPSY;  Surgeon: Lemar Lofty., MD;  Location: [facility]COPY;  Service: Gastroenterology;;   BIOPSY  04/17/2022   Procedure: BIOPSY;  Surgeon: Lemar Lofty., MD;  Location: [facility]OSCOPY;  Service:  Gastroenterology;;   CATARACT EXTRACTION W/PHACO  11/20/2011   Procedure: CATARACT EXTRACTION PHACO AND INTRAOCULAR LENS PLACEMENT (IOC);  Surgeon: Gemma Payor, MD;  Location: [facility] ORS;  Service: Ophthalmology;  Laterality: Right;  CDE 18.82   CATARACT EXTRACTION W/PHACO Left 11/18/2012   Procedure: CATARACT EXTRACTION PHACO AND INTRAOCULAR LENS PLACEMENT (IOC);  Surgeon: Gemma Payor, MD;  Location: [facility] ORS;  Service: Ophthalmology;  Laterality: Left;  CDE: 18.97   COLONOSCOPY N/A 02/09/2013   Procedure: COLONOSCOPY;  Surgeon: Malissa Hippo, MD;  Location: [facility];  Service: Endoscopy;  Laterality: N/A;  305-moved to 220 Ann to notify pt   COLONOSCOPY N/A 07/08/2018   Procedure: COLONOSCOPY;  Surgeon: Malissa Hippo, MD;  Location: [facility];  Service: Endoscopy;  Laterality: N/A;  930   ENDOSCOPIC MUCOSAL RESECTION N/A 10/07/2021   Procedure: ENDOSCOPIC MUCOSAL RESECTION;  Surgeon: Meridee Score Netty Starring., MD;  Location: [facility] END[facility];  Service: Gastroenterology;  Laterality: N/A;   ESOPHAGOGASTRODUODENOSCOPY N/A 04/17/2022   Procedure: ESOPHAGOGASTRODUODENOSCOPY (EGD);  Surgeon: Lemar Lofty., MD;  Location: [facility];  Service: Gastroenterology;  Laterality: N/A;   ESOPHAGOGASTRODUODENOSCOPY (EGD) WITH PROPOFOL N/A 09/10/2021   Procedure: ESOPHAGOGASTRODUODENOSCOPY (EGD) WITH PROPOFOL;  Surgeon: Dolores Frame, MD;  [facility]: [facility];  Service: Gastroenterology;  Laterality: N/A;   ESOPHAGOGASTRODUODENOSCOPY (EGD) WITH PROPOFOL N/A 10/07/2021   Procedure: ESOPHAGOGASTRODUODENOSCOPY (EGD) WITH PROPOFOL;  Surgeon: Meridee Score Netty Starring., MD;  Location: [facility] END[facility];  Service: Gastroenterology;  Laterality: N/A;   EUS N/A 10/07/2021   Procedure: UPPER ENDOSCOPIC ULTRASOUND (EUS) RADIAL;  Surgeon: Lemar Lofty., MD;  Location: [facility];  Service: Gastroenterology;  Laterality: N/A;   GASTRIC VARICES BANDING  10/07/2021   Procedure: duodenal tumor BANDING;   Surgeon: Meridee Score Netty Starring., MD;  Location: [facility];  Service: Gastroenterology;;  with emr kit   HEMOSTASIS CLIP PLACEMENT  10/07/2021   Procedure: HEMOSTASIS CLIP PLACEMENT;  Surgeon: Lemar Lofty., MD;  Location: [facility];  Service: Gastroenterology;;   INSERTION OF DIALYSIS CATHETER Right 06/24/2013   Procedure: INSERTION OF DIALYSIS CATHETER: Ultrasound guided;  Surgeon: Nada Libman, MD;  Location: [facility] OR;  Service: Vascular;  Laterality: Right;   LIGATION OF ARTERIOVENOUS  FISTULA Right 01/26/2014   Procedure: LIGATION OF ARTERIOVENOUS  FISTULA;  Surgeon: Pryor Ochoa, MD;  Location: [facility] OR;  Service: Vascular;  Laterality: Right;   LOOP RECORDER IMPLANT N/A 07/21/2013   Procedure: LOOP RECORDER IMPLANT;  Surgeon: Marinus Maw, MD;  Location: [facility] CATH LAB;  Service: Cardiovascular;  Laterality: N/A;   POLYPECTOMY  07/08/2018   Procedure: POLYPECTOMY;  Surgeon: Malissa Hippo, MD;  Location: [facility];  Service: Endoscopy;;  Descending colon polyps x 2    SUBMUCOSAL LIFTING INJECTION  10/07/2021   Procedure: SUBMUCOSAL LIFTING INJECTION;  Surgeon: Lemar Lofty., MD;  Location: [facility];  Service: Gastroenterology;;   TEE WITHOUT CARDIOVERSION N/A 07/21/2013   Procedure: TRANSESOPHAGEAL ECHOCARDIOGRAM (TEE);  Surgeon: Lars Masson, MD;  Location: [facility] ENDOSCOPY;  Service: Cardiovascular;  Laterality: N/A;   UPPER ESOPHAGEAL ENDOSCOPIC ULTRASOUND (EUS) N/A 04/17/2022   Procedure: UPPER ESOPHAGEAL ENDOSCOPIC ULTRASOUND (EUS);  Surgeon: Lemar Lofty., MD;  Location: [facility];  Service: Gastroenterology;  Laterality: N/A;    Family History  Problem Relation Age of Onset   Cancer Sister    Cancer Brother    Anesthesia problems Neg Hx    Hypotension Neg Hx    Malignant hyperthermia Neg Hx    Pseudochol deficiency Neg Hx    Social History:  reports that she has never smoked. She has never been exposed to tobacco smoke. She has never used  smokeless tobacco. She reports that she does not drink alcohol and does not use drugs.  Allergies:  Allergies  Allergen Reactions   Ambien [Zolpidem] Other (See Comments)    Hallucinations    Reglan [Metoclopramide] Other (See Comments)    "makes me crazy"    Medications Prior to Admission  Medication Sig Dispense Refill   acetaminophen (TYLENOL) 500 MG tablet Take 500 mg by mouth at bedtime.     albuterol (VENTOLIN HFA) 108 (90 Base) MCG/ACT inhaler Inhale 2 puffs into the lungs every 6 (six) hours as needed for wheezing or shortness of breath. 1 each 0   aspirin EC 81 MG tablet Take 1 tablet (81 mg total) by mouth daily. Swallow whole. 30 tablet 2   cyanocobalamin (,VITAMIN B-12,) 1000 MCG/ML injection Inject 1,000 mcg into the muscle every 30 (thirty) days.  11   diclofenac Sodium (VOLTAREN) 1 % GEL Apply 4 g topically 4 (four) times daily as needed. Apply to bilateral knees 100 g 0   HUMULIN 70/30 (70-30) 100 UNIT/ML injection Inject 5-10 Units into the skin See admin instructions. Inject 10 units into the skin every morning and 5 units at night.     labetalol (NORMODYNE) 200 MG tablet Take 200 mg by mouth 2 (two) times daily.     multivitamin (RENA-VIT)  TABS tablet Take 1 tablet by mouth at bedtime. 30 tablet 0   omeprazole (PRILOSEC) 40 MG capsule TAKE ONE CAPSULE BY MOUTH TWICE DAILY BEFORE A meal (Patient taking differently: Take 40 mg by mouth in the morning and at bedtime.) 180 capsule 3    No results found for this or any previous visit (from the past 48 hour(s)). No results found.  Review of Systems  HENT:  Positive for trouble swallowing.   All other systems reviewed and are negative.   Blood pressure (!) 163/69, pulse 82, temperature 97.9 F (36.6 C), temperature source Oral, resp. rate 16, height 5\' 7"  (1.702 m), weight 72.4 kg, SpO2 100%. Physical Exam  GENERAL: The patient is AO x3, in no acute distress. HEENT: Head is normocephalic and atraumatic. EOMI are  intact. Mouth is well hydrated and without lesions. NECK: Supple. No masses LUNGS: Clear to auscultation. No presence of rhonchi/wheezing/rales. Adequate chest expansion HEART: RRR, normal s1 and s2. ABDOMEN: Soft, nontender, no guarding, no peritoneal signs, and nondistended. BS +. No masses. EXTREMITIES: Without any cyanosis, clubbing, rash, lesions or edema. NEUROLOGIC: AOx3, no focal motor deficit. SKIN: no jaundice, no rashes  Assessment/Plan [name] is a 82 y.o. 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 Will proceed with EGD.  Dolores Frame, MD 02/03/2023, 7:35 AM

## 2023-02-03 NOTE — Anesthesia Preprocedure Evaluation (Addendum)
Anesthesia Evaluation  Patient identified by MRN, date of birth, ID band Patient awake    Reviewed: Allergy & Precautions, H&P , NPO status , Patient's Chart, lab work & pertinent test results, reviewed documented beta blocker date and time   Airway Mallampati: II  TM Distance: >3 FB Neck ROM: full    Dental no notable dental hx. (+) Edentulous Upper, Edentulous Lower   Pulmonary neg pulmonary ROS, shortness of breath, pneumonia   Pulmonary exam normal breath sounds clear to auscultation       Cardiovascular Exercise Tolerance: Good hypertension, + Peripheral Vascular Disease   Rhythm:regular Rate:Normal     Neuro/Psych  PSYCHIATRIC DISORDERS       Neuromuscular disease CVA, Residual Symptoms negative neurological ROS  negative psych ROS   GI/Hepatic negative GI ROS, Neg liver ROS,GERD  ,,  Endo/Other  diabetes    Renal/GU ESRF and DialysisRenal diseasenegative Renal ROS  negative genitourinary   Musculoskeletal   Abdominal   Peds  Hematology negative hematology ROS (+) Blood dyscrasia, anemia   Anesthesia Other Findings   Reproductive/Obstetrics negative OB ROS                             Anesthesia Physical Anesthesia Plan  ASA: 3  Anesthesia Plan: General   Post-op Pain Management:    Induction:   PONV Risk Score and Plan: Propofol infusion  Airway Management Planned:   Additional Equipment:   Intra-op Plan:   Post-operative Plan:   Informed Consent: I have reviewed the patients History and Physical, chart, labs and discussed the procedure including the risks, benefits and alternatives for the proposed anesthesia with the patient or authorized representative who has indicated his/her understanding and acceptance.     Dental Advisory Given  Plan Discussed with: CRNA  Anesthesia Plan Comments:        Anesthesia Quick Evaluation

## 2023-02-04 DIAGNOSIS — N186 End stage renal disease: Secondary | ICD-10-CM | POA: Diagnosis not present

## 2023-02-04 DIAGNOSIS — Z992 Dependence on renal dialysis: Secondary | ICD-10-CM | POA: Diagnosis not present

## 2023-02-04 LAB — SURGICAL PATHOLOGY

## 2023-02-05 ENCOUNTER — Encounter (HOSPITAL_COMMUNITY): Payer: Self-pay | Admitting: Gastroenterology

## 2023-02-05 ENCOUNTER — Ambulatory Visit: Payer: 59 | Admitting: Orthopedic Surgery

## 2023-02-06 ENCOUNTER — Telehealth (INDEPENDENT_AMBULATORY_CARE_PROVIDER_SITE_OTHER): Payer: Self-pay | Admitting: Gastroenterology

## 2023-02-06 DIAGNOSIS — Z992 Dependence on renal dialysis: Secondary | ICD-10-CM | POA: Diagnosis not present

## 2023-02-06 DIAGNOSIS — N186 End stage renal disease: Secondary | ICD-10-CM | POA: Diagnosis not present

## 2023-02-06 NOTE — Telephone Encounter (Signed)
Discussed the case with Dr. Meridee Score.  As the patient has had normal biopsies from endoscopic mucosal resection site x 2, we will hold off on performing EGD/EUS surveillance for now.  Will need to continue imaging surveillance with PET/Dotatate scan in 2 months.  Depending on findings, we will determine the next surveillance endoscopic procedure.  Kenney Houseman, can you please schedule a PET Dotatate scan in 2 months? Dx: Neuroendocrine tumor.  Thanks,  Katrinka Blazing, MD Gastroenterology and Hepatology [facility] Gastroenterology

## 2023-02-09 ENCOUNTER — Encounter (INDEPENDENT_AMBULATORY_CARE_PROVIDER_SITE_OTHER): Payer: Self-pay | Admitting: *Deleted

## 2023-02-09 DIAGNOSIS — N186 End stage renal disease: Secondary | ICD-10-CM | POA: Diagnosis not present

## 2023-02-09 DIAGNOSIS — Z992 Dependence on renal dialysis: Secondary | ICD-10-CM | POA: Diagnosis not present

## 2023-02-11 DIAGNOSIS — N186 End stage renal disease: Secondary | ICD-10-CM | POA: Diagnosis not present

## 2023-02-11 DIAGNOSIS — Z992 Dependence on renal dialysis: Secondary | ICD-10-CM | POA: Diagnosis not present

## 2023-02-12 ENCOUNTER — Other Ambulatory Visit: Payer: Self-pay

## 2023-02-12 ENCOUNTER — Telehealth (INDEPENDENT_AMBULATORY_CARE_PROVIDER_SITE_OTHER): Payer: Self-pay | Admitting: *Deleted

## 2023-02-12 MED ORDER — HUMULIN 70/30 (70-30) 100 UNIT/ML ~~LOC~~ SUSP: 5.0000 [IU] | SUBCUTANEOUS | 0 refills | Status: DC

## 2023-02-12 NOTE — Telephone Encounter (Signed)
Fax from [facility]. Hemochromatosis dna -pcr ( [id number]) ordered on 7/15 is one of their test that requires a pre auth code. They are requesting office notes, letter of medical necessity, personal and family history, most recent physical, daily progress notes, consult notes and treatment records. ( I will send to [facility] for the medical records but they are needing a letter of medical necessity for the test ordered) and a signature on form. Form on your desk.

## 2023-02-13 DIAGNOSIS — Z992 Dependence on renal dialysis: Secondary | ICD-10-CM | POA: Diagnosis not present

## 2023-02-13 DIAGNOSIS — N186 End stage renal disease: Secondary | ICD-10-CM | POA: Diagnosis not present

## 2023-02-14 DIAGNOSIS — Z992 Dependence on renal dialysis: Secondary | ICD-10-CM | POA: Diagnosis not present

## 2023-02-14 DIAGNOSIS — N186 End stage renal disease: Secondary | ICD-10-CM | POA: Diagnosis not present

## 2023-02-16 DIAGNOSIS — N186 End stage renal disease: Secondary | ICD-10-CM | POA: Diagnosis not present

## 2023-02-16 DIAGNOSIS — Z992 Dependence on renal dialysis: Secondary | ICD-10-CM | POA: Diagnosis not present

## 2023-02-17 ENCOUNTER — Encounter: Payer: 59 | Attending: Physical Medicine & Rehabilitation | Admitting: Physical Medicine & Rehabilitation

## 2023-02-17 ENCOUNTER — Encounter: Payer: Self-pay | Admitting: Physical Medicine & Rehabilitation

## 2023-02-17 VITALS — BP 167/67 | HR 93 | Ht 67.0 in | Wt 157.8 lb

## 2023-02-17 DIAGNOSIS — Z794 Long term (current) use of insulin: Secondary | ICD-10-CM

## 2023-02-17 DIAGNOSIS — E1142 Type 2 diabetes mellitus with diabetic polyneuropathy: Secondary | ICD-10-CM | POA: Diagnosis not present

## 2023-02-17 MED ORDER — PREGABALIN 25 MG PO CAPS: 25.0000 mg | ORAL_CAPSULE | Freq: Every day | ORAL | 1 refills | Status: DC

## 2023-02-17 NOTE — Telephone Encounter (Signed)
Chelsea, do you want me to write a letter stating it is medically necessary that patient had that lab test and print for you to sign or do you want it to say something specific.

## 2023-02-17 NOTE — Progress Notes (Signed)
Subjective:    Patient ID: [name], female    DOB: 12-28-1940, 82 y.o.   MRN: [id number] Referred by Vascular surgeon Dr Lenell Antu  HPI 82 year old female with history of diabetes, ESRD, peripheral vascular disease and left CVA who was a former patient on my stroke inpatient rehab service in 2015  referred by her vascular surgeon for further evaluation of lower extremity pain.  The patient was referred for bilateral foot pain primarily the patient also notes that she has hand pain.  In addition she does have some pain on the right side of her neck as well. She rates her pain as sharp stabbing and severe pain is worse with walking and sitting.  She is accompanied by her daughter who provides some history.  The patient is a poor historian.  She is also hard of hearing Patient has lower extremity greater than upper extremity symptoms.  She states that pain has been ongoing for about a year but worsening over the last 3 to 4 months. The patient requires assistance from daughter for dressing and bathing as well as household duties. Past surgical history is positive for right first ray amputation Last vascular ultrasound in 2021 showed right ABI of 0.92 left ABI of 0.66  Seen by Dr Alvester Morin, shots lasted only about 2 hours, do not see this record in epic. CLINICAL DATA:  Lumbar radiculopathy, no red flags, no prior management   EXAM: MRI LUMBAR SPINE WITHOUT CONTRAST   TECHNIQUE: Multiplanar, multisequence MR imaging of the lumbar spine was performed. No intravenous contrast was administered.   COMPARISON:  CT 03/23/2020   FINDINGS: Segmentation:  Standard.   Alignment: Grade 1 anterolisthesis of L4 on L5. Slight lumbar dextrocurvature.   Vertebrae: No fracture, evidence of discitis, or bone lesion. Superior endplate Schmorl's node of L1.   Conus medullaris and cauda equina: Conus extends to the T12-L1 level. Conus appears normal. Slight bunching of the cauda equina nerve roots  above the L4-5 level of stenosis.   Paraspinal and other soft tissues: Colonic diverticulosis. Cortically based T2 hyperintense lesionswithin the bilateral kidneys, incompletely characterized, but most likely represent cysts.   Disc levels:   T12-L1: Minimal disc bulge. Unremarkable facet joints. No foraminal or canal stenosis.   L1-L2: Unremarkable disc. Minimal facet arthropathy. No foraminal or canal stenosis.   L2-L3: Mild annular disc bulge. Mild bilateral facet arthropathy. No canal stenosis. Borderline-mild right foraminal stenosis.   L3-L4: No disc protrusion. Mild bilateral facet arthropathy. No canal stenosis. Borderline-mild bilateral foraminal stenosis.   L4-L5: Anterolisthesis with disc uncovering and diffuse disc bulge. Severe bilateral facet arthropathy and ligamentum flavum buckling. Findings result in severe canal stenosis with severe left and moderate right foraminal stenosis.   L5-S1: Diffuse disc bulge, eccentric to the right with endplate osteophytic ridging. Mild bilateral facet arthropathy. Moderate right and mild left foraminal stenosis. No canal stenosis.   IMPRESSION: 1. Multilevel lumbar spondylosis as described above, most pronounced at L4-5 where there is severe canal stenosis with severe left and moderate right foraminal stenosis. 2. Moderate right and mild left foraminal stenosis at L5-S1.     Electronically Signed   By: Duanne Guess D.O.   On: 08/24/2021 20:31     Pain Inventory Average Pain 10 Pain Right Now 10 My pain is sharp and stabbing  In the last 24 hours, has pain interfered with the following? General activity 9 Relation with others 9 Enjoyment of life 9 What TIME of day is your pain at  its worst? morning , daytime, evening, and night Sleep (in general) Poor  Pain is worse with: walking and sitting Pain improves with:  . Relief from Meds:  .  use a walker how many minutes can you walk? 10 ability to climb  steps?  no do you drive?  no transfers alone Do you have any goals in this area?  no  retired I need assistance with the following:  dressing, bathing, and household duties  trouble walking loss of taste or smell  Any changes since last visit?  no  Any changes since last visit?  no    Family History  Problem Relation Age of Onset   Cancer Sister    Cancer Brother    Anesthesia problems Neg Hx    Hypotension Neg Hx    Malignant hyperthermia Neg Hx    Pseudochol deficiency Neg Hx    Social History   Socioeconomic History   Marital status: Widowed    Spouse name: Not on file   Number of children: Not on file   Years of education: Not on file   Highest education level: Not on file  Occupational History   Not on file  Tobacco Use   Smoking status: Never    Passive exposure: Never   Smokeless tobacco: Never  Vaping Use   Vaping status: Never Used  Substance and Sexual Activity   Alcohol use: No   Drug use: No   Sexual activity: Not on file  Other Topics Concern   Not on file  Social History Narrative   ** Merged History Encounter **       Social Determinants of Health   Financial Resource Strain: Not on file  Food Insecurity: No Food Insecurity (01/29/2023)   Hunger Vital Sign    Worried About Running Out of Food in the Last Year: Never true    Ran Out of Food in the Last Year: Never true  Transportation Needs: No Transportation Needs (01/29/2023)   PRAPARE - Administrator, Civil Service (Medical): No    Lack of Transportation (Non-Medical): No  Physical Activity: Not on file  Stress: Not on file  Social Connections: Not on file   Past Surgical History:  Procedure Laterality Date   AMPUTATION Right 01/13/2020   Procedure: AMPUTATION RIGHT FOOT 1ST RAY;  Surgeon: Nadara Mustard, MD;  Location: [facility] OR;  Service: Orthopedics;  Laterality: Right;   AV FISTULA PLACEMENT Right 09/08/2013   Procedure: CREATION OF RIGHT BRACHIAL CEPHALIC ARTERIOVENOUS  FISTULA ;  Surgeon: Pryor Ochoa, MD;  Location: [facility] OR;  Service: Vascular;  Laterality: Right;   BASCILIC VEIN TRANSPOSITION Right 01/26/2014   Procedure: Right Arm BASILIC VEIN TRANSPOSITION;  Surgeon: Pryor Ochoa, MD;  Location: [facility] OR;  Service: Vascular;  Laterality: Right;   BIOPSY  09/10/2021   Procedure: BIOPSY;  Surgeon: Dolores Frame, MD;  Location: [facility];  Service: Gastroenterology;;  gastric   BIOPSY  10/07/2021   Procedure: BIOPSY;  Surgeon: Lemar Lofty., MD;  Location: [facility];  Service: Gastroenterology;;   BIOPSY  04/17/2022   Procedure: BIOPSY;  Surgeon: Lemar Lofty., MD;  Location: [facility];  Service: Gastroenterology;;   BIOPSY  02/03/2023   Procedure: BIOPSY;  Surgeon: Dolores Frame, MD;  Location: [facility];  Service: Gastroenterology;;   CATARACT EXTRACTION W/PHACO  11/20/2011   Procedure: CATARACT EXTRACTION PHACO AND INTRAOCULAR LENS PLACEMENT (IOC);  Surgeon: Gemma Payor, MD;  Location: [facility];  Service: Ophthalmology;  Laterality: Right;  CDE 18.82   CATARACT EXTRACTION W/PHACO Left 11/18/2012   Procedure: CATARACT EXTRACTION PHACO AND INTRAOCULAR LENS PLACEMENT (IOC);  Surgeon: Gemma Payor, MD;  Location: [facility];  Service: Ophthalmology;  Laterality: Left;  CDE: 18.97   COLONOSCOPY N/A 02/09/2013   Procedure: COLONOSCOPY;  Surgeon: Malissa Hippo, MD;  [facility]: [facility];  Service: Endoscopy;  Laterality: N/A;  305-moved to 220 Ann to notify pt   COLONOSCOPY N/A 07/08/2018   Procedure: COLONOSCOPY;  Surgeon: Malissa Hippo, MD;  [facility]: [facility];  Service: Endoscopy;  Laterality: N/A;  930   ENDOSCOPIC MUCOSAL RESECTION N/A 10/07/2021   Procedure: ENDOSCOPIC MUCOSAL RESECTION;  Surgeon: Meridee Score Netty Starring., MD;  Location: [facility];  Service: Gastroenterology;  Laterality: N/A;   ESOPHAGEAL DILATION N/A 02/03/2023   Procedure: ESOPHAGEAL DILATION;  Surgeon: Dolores Frame, MD;   [facility]: [facility];  Service: Gastroenterology;  Laterality: N/A;  7:30AM;ASA 3   ESOPHAGOGASTRODUODENOSCOPY N/A 04/17/2022   Procedure: ESOPHAGOGASTRODUODENOSCOPY (EGD);  Surgeon: Lemar Lofty., MD;  Location: [facility];  Service: Gastroenterology;  Laterality: N/A;   ESOPHAGOGASTRODUODENOSCOPY (EGD) WITH PROPOFOL N/A 09/10/2021   Procedure: ESOPHAGOGASTRODUODENOSCOPY (EGD) WITH PROPOFOL;  Surgeon: Dolores Frame, MD;  [facility];  Service: Gastroenterology;  Laterality: N/A;   ESOPHAGOGASTRODUODENOSCOPY (EGD) WITH PROPOFOL N/A 10/07/2021   Procedure: ESOPHAGOGASTRODUODENOSCOPY (EGD) WITH PROPOFOL;  Surgeon: Meridee Score Netty Starring., MD;  Location: [facility];  Service: Gastroenterology;  Laterality: N/A;   ESOPHAGOGASTRODUODENOSCOPY (EGD) WITH PROPOFOL N/A 02/03/2023   Procedure: ESOPHAGOGASTRODUODENOSCOPY (EGD) WITH PROPOFOL;  Surgeon: Dolores Frame, MD;  [facility];  Service: Gastroenterology;  Laterality: N/A;  7:30AM;ASA 3   EUS N/A 10/07/2021   Procedure: UPPER ENDOSCOPIC ULTRASOUND (EUS) RADIAL;  Surgeon: Lemar Lofty., MD;  Location: [facility];  Service: Gastroenterology;  Laterality: N/A;   GASTRIC VARICES BANDING  10/07/2021   Procedure: duodenal tumor BANDING;  Surgeon: Meridee Score Netty Starring., MD;  Location: [facility];  Service: Gastroenterology;;  with emr kit   HEMOSTASIS CLIP PLACEMENT  10/07/2021   Procedure: HEMOSTASIS CLIP PLACEMENT;  Surgeon: Lemar Lofty., MD;  Location: [facility];  Service: Gastroenterology;;   INSERTION OF DIALYSIS CATHETER Right 06/24/2013   Procedure: INSERTION OF DIALYSIS CATHETER: Ultrasound guided;  Surgeon: Nada Libman, MD;  Location: [facility] OR;  Service: Vascular;  Laterality: Right;   LIGATION OF ARTERIOVENOUS  FISTULA Right 01/26/2014   Procedure: LIGATION OF ARTERIOVENOUS  FISTULA;  Surgeon: Pryor Ochoa, MD;  Location: [facility] OR;  Service: Vascular;  Laterality:  Right;   LOOP RECORDER IMPLANT N/A 07/21/2013   Procedure: LOOP RECORDER IMPLANT;  Surgeon: Marinus Maw, MD;  Location: [facility] CATH LAB;  Service: Cardiovascular;  Laterality: N/A;   POLYPECTOMY  07/08/2018   Procedure: POLYPECTOMY;  Surgeon: Malissa Hippo, MD;  Location: [facility];  Service: Endoscopy;;  Descending colon polyps x 2    SUBMUCOSAL LIFTING INJECTION  10/07/2021   Procedure: SUBMUCOSAL LIFTING INJECTION;  Surgeon: Lemar Lofty., MD;  Location: [facility];  Service: Gastroenterology;;   TEE WITHOUT CARDIOVERSION N/A 07/21/2013   Procedure: TRANSESOPHAGEAL ECHOCARDIOGRAM (TEE);  Surgeon: Lars Masson, MD;  Location: [facility] ENDOSCOPY;  Service: Cardiovascular;  Laterality: N/A;   UPPER ESOPHAGEAL ENDOSCOPIC ULTRASOUND (EUS) N/A 04/17/2022   Procedure: UPPER ESOPHAGEAL ENDOSCOPIC ULTRASOUND (EUS);  Surgeon: Lemar Lofty., MD;  Location: [facility];  Service: Gastroenterology;  Laterality: N/A;   Past Medical History:  Diagnosis Date  Anemia    Cataract    Chronic kidney disease    CVA (cerebral infarction)    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   Dialysis patient (HCC)    M, W, F   Fistula    R arm   GERD (gastroesophageal reflux disease)    Hypertension    Renal disorder    Shortness of breath    Stroke (HCC)    right side weakness   BP (!) 187/83   Pulse 93   Ht 5\' 7"  (1.702 m)   Wt 157 lb 12.8 oz (71.6 kg)   SpO2 97%   BMI 24.71 kg/m   Opioid Risk Score:   Fall Risk Score:  `1  Depression screen [facility] 2/9     02/17/2023    1:23 PM 01/29/2023    9:57 AM  Depression screen PHQ 2/9  Decreased Interest 3 0  Down, Depressed, Hopeless 3 0  PHQ - 2 Score 6 0  Altered sleeping 3   Tired, decreased energy 3   Change in appetite 3   Feeling bad or failure about yourself  2   Trouble concentrating 2   Moving slowly or fidgety/restless 3   Suicidal thoughts 0   PHQ-9 Score 22   Difficult doing  work/chores Very difficult       Review of Systems  Musculoskeletal:  Positive for back pain, gait problem and neck pain.       B/L arm pain   All other systems reviewed and are negative.     Objective:   Physical Exam  Ext s/p well healed R 1st ray amputation  Toes are warm bilaterally without evidence of cyanosis No hypersensitivity to touch in BLE Neuro:  Eyes without evidence of nystagmus  Tone is normal without evidence of spasticity Cerebellar exam shows no evidence of ataxia on finger nose finger or heel to shin testing No evidence of trunkal ataxia  Motor strength is 5/5 in bilateral deltoid, biceps, triceps, finger flexors and extensors, wrist flexors and extensors, hip flexors, knee flexors and extensors, ankle dorsiflexors, plantar flexors, invertors and evertors, toe flexors and extensors  Sensory exam is absence of Bilateral LT and pp to supramalleolar area bilaterally Reduced proprioception to toes bilaterally           Assessment & Plan:   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  Per daughter was too groggy on gabapentin , has not tried lyrica.  We discussed that this med may also cause drowsiness but will start low dose of 25mg  daily , may need to titrate upward to 75mg  max renal dose if symptoms persist and pt tolerates.  If this fails would refer to my colleague for [facility] patch. Pt also has lumbar spinal stenosis which may cause similar symptoms but pt reportedly did not respond to  [facility] in the past .

## 2023-02-17 NOTE — Telephone Encounter (Signed)
[name], see message below about letter of medical neccessity.

## 2023-02-17 NOTE — Telephone Encounter (Signed)
Records printed. 

## 2023-02-18 DIAGNOSIS — Z992 Dependence on renal dialysis: Secondary | ICD-10-CM | POA: Diagnosis not present

## 2023-02-18 DIAGNOSIS — N186 End stage renal disease: Secondary | ICD-10-CM | POA: Diagnosis not present

## 2023-02-19 ENCOUNTER — Other Ambulatory Visit (INDEPENDENT_AMBULATORY_CARE_PROVIDER_SITE_OTHER): Payer: Self-pay | Admitting: *Deleted

## 2023-02-19 NOTE — Telephone Encounter (Signed)
Form signed by Leeroy Bock, notes and letter of medical necessity  faxed to [facility].

## 2023-02-20 DIAGNOSIS — N186 End stage renal disease: Secondary | ICD-10-CM | POA: Diagnosis not present

## 2023-02-20 DIAGNOSIS — Z992 Dependence on renal dialysis: Secondary | ICD-10-CM | POA: Diagnosis not present

## 2023-02-23 DIAGNOSIS — N186 End stage renal disease: Secondary | ICD-10-CM | POA: Diagnosis not present

## 2023-02-23 DIAGNOSIS — Z992 Dependence on renal dialysis: Secondary | ICD-10-CM | POA: Diagnosis not present

## 2023-02-23 NOTE — Telephone Encounter (Signed)
Note placed in reminder file

## 2023-02-24 ENCOUNTER — Ambulatory Visit (INDEPENDENT_AMBULATORY_CARE_PROVIDER_SITE_OTHER): Payer: 59 | Admitting: "Endocrinology

## 2023-02-24 ENCOUNTER — Encounter: Payer: Self-pay | Admitting: "Endocrinology

## 2023-02-24 VITALS — BP 124/66 | HR 88 | Ht 67.0 in | Wt 160.0 lb

## 2023-02-24 DIAGNOSIS — E1122 Type 2 diabetes mellitus with diabetic chronic kidney disease: Secondary | ICD-10-CM

## 2023-02-24 DIAGNOSIS — N186 End stage renal disease: Secondary | ICD-10-CM | POA: Diagnosis not present

## 2023-02-24 DIAGNOSIS — Z794 Long term (current) use of insulin: Secondary | ICD-10-CM | POA: Diagnosis not present

## 2023-02-24 DIAGNOSIS — I1311 Hypertensive heart and chronic kidney disease without heart failure, with stage 5 chronic kidney disease, or end stage renal disease: Secondary | ICD-10-CM | POA: Diagnosis not present

## 2023-02-24 DIAGNOSIS — E785 Hyperlipidemia, unspecified: Secondary | ICD-10-CM

## 2023-02-24 DIAGNOSIS — E1169 Type 2 diabetes mellitus with other specified complication: Secondary | ICD-10-CM

## 2023-02-24 DIAGNOSIS — Z992 Dependence on renal dialysis: Secondary | ICD-10-CM | POA: Diagnosis not present

## 2023-02-24 DIAGNOSIS — I120 Hypertensive chronic kidney disease with stage 5 chronic kidney disease or end stage renal disease: Secondary | ICD-10-CM

## 2023-02-24 MED ORDER — HUMULIN 70/30 (70-30) 100 UNIT/ML ~~LOC~~ SUSP: 10.0000 [IU] | SUBCUTANEOUS | 0 refills | Status: DC

## 2023-02-24 NOTE — Progress Notes (Signed)
02/24/2023, 1:22 PM  Endocrinology follow-up note   Subjective:    Patient ID: [name], female    DOB: 03/16/1941.  [name] is being seen in follow-up after she was treated consultation for management of currently uncontrolled symptomatic diabetes requested by  Mirna Mires, MD. She is accompanied by her daughter to clinic.  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   Dialysis patient (HCC)    M, W, F   Fistula    R arm   GERD (gastroesophageal reflux disease)    Hypertension    Renal disorder    Shortness of breath    Stroke [name])    right side weakness    Past Surgical History:  Procedure Laterality Date   AMPUTATION Right 01/13/2020   Procedure: AMPUTATION RIGHT FOOT 1ST RAY;  Surgeon: Nadara Mustard, MD;  Location: [facility];  Service: Orthopedics;  Laterality: Right;   AV FISTULA PLACEMENT Right 09/08/2013   Procedure: CREATION OF RIGHT BRACHIAL CEPHALIC ARTERIOVENOUS FISTULA ;  Surgeon: Pryor Ochoa, MD;  Location: [facility];  Service: Vascular;  Laterality: Right;   BASCILIC VEIN TRANSPOSITION Right 01/26/2014   Procedure: Right Arm BASILIC VEIN TRANSPOSITION;  Surgeon: Pryor Ochoa, MD;  Location: [facility];  Service: Vascular;  Laterality: Right;   BIOPSY  09/10/2021   Procedure: BIOPSY;  Surgeon: Dolores Frame, MD;  [facility]: [facility];  Service: Gastroenterology;;  gastric   BIOPSY  10/07/2021   Procedure: BIOPSY;  Surgeon: Lemar Lofty., MD;  Location: [facility];  Service: Gastroenterology;;   BIOPSY  04/17/2022   Procedure: BIOPSY;  Surgeon: Lemar Lofty., MD;  Location: [facility];  Service: Gastroenterology;;   BIOPSY  02/03/2023   Procedure: BIOPSY;  Surgeon: Dolores Frame, MD;  [facility];  Service:  Gastroenterology;;   CATARACT EXTRACTION W/PHACO  11/20/2011   Procedure: CATARACT EXTRACTION PHACO AND INTRAOCULAR LENS PLACEMENT (IOC);  Surgeon: Gemma Payor, MD;  Location: [facility];  Service: Ophthalmology;  Laterality: Right;  CDE 18.82   CATARACT EXTRACTION W/PHACO Left 11/18/2012   Procedure: CATARACT EXTRACTION PHACO AND INTRAOCULAR LENS PLACEMENT (IOC);  Surgeon: Gemma Payor, MD;  Location: [facility];  Service: Ophthalmology;  Laterality: Left;  CDE: 18.97   COLONOSCOPY N/A 02/09/2013   Procedure: COLONOSCOPY;  Surgeon: Malissa Hippo, MD;  [facility];  Service: Endoscopy;  Laterality: N/A;  305-moved to 220 Ann to notify pt   COLONOSCOPY N/A 07/08/2018   Procedure: COLONOSCOPY;  Surgeon: Malissa Hippo, MD;  [facility];  Service: Endoscopy;  Laterality: N/A;  930   ENDOSCOPIC MUCOSAL RESECTION N/A 10/07/2021   Procedure: ENDOSCOPIC MUCOSAL RESECTION;  Surgeon: Meridee Score Netty Starring., MD;  Location: [facility];  Service: Gastroenterology;  Laterality: N/A;   ESOPHAGEAL DILATION N/A 02/03/2023   Procedure: ESOPHAGEAL DILATION;  Surgeon: Dolores Frame, MD;  [facility];  Service: Gastroenterology;  Laterality: N/A;  7:30AM;ASA 3   ESOPHAGOGASTRODUODENOSCOPY N/A 04/17/2022   Procedure: ESOPHAGOGASTRODUODENOSCOPY (EGD);  Surgeon: Lemar Lofty., MD;  Location: [facility];  Service: Gastroenterology;  Laterality: N/A;   ESOPHAGOGASTRODUODENOSCOPY (EGD) WITH PROPOFOL N/A 09/10/2021   Procedure: ESOPHAGOGASTRODUODENOSCOPY (EGD) WITH PROPOFOL;  Surgeon: Dolores Frame, MD;  [facility];  Service: Gastroenterology;  Laterality: N/A;   ESOPHAGOGASTRODUODENOSCOPY (EGD) WITH PROPOFOL N/A 10/07/2021   Procedure: ESOPHAGOGASTRODUODENOSCOPY (EGD) WITH PROPOFOL;  Surgeon: Meridee Score Netty Starring., MD;  Location: [facility];  Service: Gastroenterology;  Laterality: N/A;   ESOPHAGOGASTRODUODENOSCOPY (EGD) WITH PROPOFOL N/A 02/03/2023    Procedure: ESOPHAGOGASTRODUODENOSCOPY (EGD) WITH PROPOFOL;  Surgeon: Dolores Frame, MD;  [facility];  Service: Gastroenterology;  Laterality: N/A;  7:30AM;ASA 3   EUS N/A 10/07/2021   Procedure: UPPER ENDOSCOPIC ULTRASOUND (EUS) RADIAL;  Surgeon: Lemar Lofty., MD;  Location: [facility];  Service: Gastroenterology;  Laterality: N/A;   GASTRIC VARICES BANDING  10/07/2021   Procedure: duodenal tumor BANDING;  Surgeon: Meridee Score Netty Starring., MD;  Location: [facility];  Service: Gastroenterology;;  with emr kit   HEMOSTASIS CLIP PLACEMENT  10/07/2021   Procedure: HEMOSTASIS CLIP PLACEMENT;  Surgeon: Lemar Lofty., MD;  Location: [facility];  Service: Gastroenterology;;   INSERTION OF DIALYSIS CATHETER Right 06/24/2013   Procedure: INSERTION OF DIALYSIS CATHETER: Ultrasound guided;  Surgeon: Nada Libman, MD;  Location: [facility];  Service: Vascular;  Laterality: Right;   LIGATION OF ARTERIOVENOUS  FISTULA Right 01/26/2014   Procedure: LIGATION OF ARTERIOVENOUS  FISTULA;  Surgeon: Pryor Ochoa, MD;  Location: [facility] OR;  Service: Vascular;  Laterality: Right;   LOOP RECORDER IMPLANT N/A 07/21/2013   Procedure: LOOP RECORDER IMPLANT;  Surgeon: Marinus Maw, MD;  Location: [facility] C[facility];  Service: Cardiovascular;  Laterality: N/A;   POLYPECTOMY  07/08/2018   Procedure: POLYPECTOMY;  Surgeon: Malissa Hippo, MD;  [facility];  Service: Endoscopy;;  Descending colon polyps x 2    SUBMUCOSAL LIFTING INJECTION  10/07/2021   Procedure: SUBMUCOSAL LIFTING INJECTION;  Surgeon: Lemar Lofty., MD;  Location: [facility];  Service: Gastroenterology;;   TEE WITHOUT CARDIOVERSION N/A 07/21/2013   Procedure: TRANSESOPHAGEAL ECHOCARDIOGRAM (TEE);  Surgeon: Lars Masson, MD;  Location: [facility];  Service: Cardiovascular;  Laterality: N/A;   UPPER ESOPHAGEAL ENDOSCOPIC ULTRASOUND (EUS) N/A 04/17/2022   Procedure: UPPER ESOPHAGEAL ENDOSCOPIC  ULTRASOUND (EUS);  Surgeon: Lemar Lofty., MD;  Location: [facility];  Service: Gastroenterology;  Laterality: N/A;    Social History   Socioeconomic History   Marital status: Widowed    Spouse name: Not on file   Number of children: Not on file   Years of education: Not on file   Highest education level: Not on file  Occupational History   Not on file  Tobacco Use   Smoking status: Never    Passive exposure: Never   Smokeless tobacco: Never  Vaping Use   Vaping status: Never Used  Substance and Sexual Activity   Alcohol use: No   Drug use: No   Sexual activity: Not on file  Other Topics Concern   Not on file  Social History Narrative   ** Merged History Encounter **       Social Determinants of Health   Financial Resource Strain: Not on file  Food Insecurity: No Food Insecurity (01/29/2023)   Hunger Vital Sign    Worried About Running Out of Food in the Last Year: Never true    Ran Out of Food in the Last Year: Never true  Transportation Needs: No Transportation Needs (01/29/2023)   PRAPARE - Administrator, Civil Service (Medical): No    Lack of Transportation (Non-Medical): No  Physical Activity: Not on file  Stress: Not on file  Social Connections: Not on  file    Family History  Problem Relation Age of Onset   Cancer Sister    Cancer Brother    Anesthesia problems Neg Hx    Hypotension Neg Hx    Malignant hyperthermia Neg Hx    Pseudochol deficiency Neg Hx     Outpatient Encounter Medications as of 02/24/2023  Medication Sig   acetaminophen (TYLENOL) 500 MG tablet Take 500 mg by mouth at bedtime.   albuterol (VENTOLIN HFA) 108 (90 Base) MCG/ACT inhaler Inhale 2 puffs into the lungs every 6 (six) hours as needed for wheezing or shortness of breath.   aspirin EC 81 MG tablet Take 1 tablet (81 mg total) by mouth daily. Swallow whole.   cyanocobalamin (,VITAMIN B-12,) 1000 MCG/ML injection Inject 1,000 mcg into the muscle every 30  (thirty) days.   diclofenac Sodium (VOLTAREN) 1 % GEL Apply 4 g topically 4 (four) times daily as needed. Apply to bilateral knees   HUMULIN 70/30 (70-30) 100 UNIT/ML injection Inject 10-16 Units into the skin See admin instructions. Inject 16 units into the skin every morning with breakfast and 10 units  with supper only when glucose is above 90.   labetalol (NORMODYNE) 200 MG tablet Take 200 mg by mouth 2 (two) times daily.   multivitamin (RENA-VIT) TABS tablet Take 1 tablet by mouth at bedtime.   omeprazole (PRILOSEC) 40 MG capsule TAKE ONE CAPSULE BY MOUTH TWICE DAILY BEFORE A meal (Patient taking differently: Take 40 mg by mouth in the morning and at bedtime.)   pregabalin (LYRICA) 25 MG capsule Take 1 capsule (25 mg total) by mouth daily.   [DISCONTINUED] HUMULIN 70/30 (70-30) 100 UNIT/ML injection Inject 5-10 Units into the skin See admin instructions. Inject 10 units into the skin every morning and 5 units at night.   No facility-administered encounter medications on file as of 02/24/2023.    ALLERGIES: Allergies  Allergen Reactions   Ambien [Zolpidem] Other (See Comments)    Hallucinations    Reglan [Metoclopramide] Other (See Comments)    "makes me crazy"    VACCINATION STATUS: Immunization History  Administered Date(s) Administered   Influenza Whole 03/18/2006   Influenza-Unspecified 03/13/2020   Moderna SARS-COV2 Booster Vaccination 06/20/2020   Moderna Sars-Covid-2 Vaccination 08/31/2019, 09/28/2019   Pneumococcal-Unspecified 03/16/2019    Diabetes She presents for her follow-up diabetic visit. She has type 2 diabetes mellitus. Onset time: Was diagnosed at approximate age of 49 years. Her disease course has been worsening. There are no hypoglycemic associated symptoms. Pertinent negatives for hypoglycemia include no confusion, headaches, pallor or seizures. Pertinent negatives for diabetes include no blurred vision, no chest pain, no fatigue, no polydipsia, no polyphagia  and no polyuria. There are no hypoglycemic complications. Symptoms are stable. Diabetic complications include nephropathy. 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 Current diabetic treatments: She has significantly lowered the dose of her insulin from 15 units daily from 30 units daily recommended. Her weight is fluctuating minimally. She is following a generally unhealthy diet. When asked about meal planning, she reported none. She never participates in exercise. Her home blood glucose trend is increasing steadily. Her breakfast blood glucose range is generally >200 mg/dl. Her lunch blood glucose range is generally >200 mg/dl. Her dinner blood glucose range is generally >200 mg/dl. Her bedtime blood glucose range is generally >200 mg/dl. Her overall blood glucose range is >200 mg/dl. (She has  missed her follow-up visit since February 2024.  She presents with worsening glycemic profile, recently averaging 220 mg per DL.  Her CGM shows 34% time in range, 28% level 1 hyperglycemia, 35% level 2 hyperglycemia.  She is accompanied by her daughter who claims that patient has hypoglycemia, however her AGP report shows 0% hypoglycemia.  Her previsit labs show A1c of 10.4% increasing from 8.8% during her last visit.    ) An ACE inhibitor/angiotensin II receptor blocker is not being taken.  Hypertension This is a chronic problem. The current episode started more than 1 year ago. Pertinent negatives include no blurred vision, chest pain, headaches, palpitations or shortness of breath. Risk factors for coronary artery disease include diabetes mellitus, obesity, post-menopausal state and sedentary lifestyle. Hypertensive end-organ damage includes kidney disease. Identifiable causes of hypertension include chronic renal disease.     Review of Systems   Constitutional:  Negative for chills, fatigue, fever and unexpected weight change.  HENT:  Negative for trouble swallowing and voice change.   Eyes:  Negative for blurred vision and visual disturbance.  Respiratory:  Negative for cough, shortness of breath and wheezing.   Cardiovascular:  Negative for chest pain, palpitations and leg swelling.  Gastrointestinal:  Negative for diarrhea, nausea and vomiting.  Endocrine: Negative for cold intolerance, heat intolerance, polydipsia, polyphagia and polyuria.  Musculoskeletal:  Negative for arthralgias and myalgias.  Skin:  Negative for color change, pallor, rash and wound.  Neurological:  Negative for seizures and headaches.  Psychiatric/Behavioral:  Negative for confusion and suicidal ideas.     Objective:       02/24/2023    1:01 PM 02/17/2023    1:26 PM 02/17/2023    1:18 PM  Vitals with BMI  Height 5\' 7"   5\' 7"   Weight 160 lbs  157 lbs 13 oz  BMI 25.05  24.71  Systolic 124 167 696  Diastolic 66 67 83  Pulse 88  93    BP 124/66   Pulse 88   Ht 5\' 7"  (1.702 m)   Wt 160 lb (72.6 kg)   BMI 25.06 kg/m   Wt Readings from Last 3 Encounters:  02/24/23 160 lb (72.6 kg)  02/17/23 157 lb 12.8 oz (71.6 kg)  02/03/23 159 lb 9.8 oz (72.4 kg)     CMP ( most recent) CMP     Component Value Date/Time   NA 138 02/03/2023 0652   K 3.9 02/03/2023 0652   CL 97 (L) 02/03/2023 0652   CO2 27 01/29/2023 1044   GLUCOSE 287 (H) 02/03/2023 0652   BUN 28 (H) 02/03/2023 0652   CREATININE 4.30 (H) 02/03/2023 0652   CALCIUM 8.3 (L) 01/29/2023 1044   PROT 7.6 01/09/2023 0614   ALBUMIN 3.2 (L) 01/09/2023 0614   AST 18 01/09/2023 0614   ALT 11 01/09/2023 0614   ALKPHOS 71 01/09/2023 0614   BILITOT 0.7 01/09/2023 0614   GFRNONAA 11 (L) 01/29/2023 1044   GFRAA 5 (L) 01/16/2020 0650     Diabetic Labs (most recent): Lab Results  Component Value Date   HGBA1C 10.4 (H) 01/09/2023   HGBA1C 8.8 (H) 06/02/2022   HGBA1C 8.1 (A) 02/03/2022    MICROALBUR 112.00 03/20/2006     Lipid Panel ( most recent) Lipid Panel     Component Value Date/Time   CHOL 128 04/03/2022 1115   TRIG 76 04/03/2022 1115   HDL 50 04/03/2022 1115   CHOLHDL 2.6 04/03/2022 1115   CHOLHDL 4.0 07/21/2013 0720  VLDL 19 07/21/2013 0720   LDLCALC 63 04/03/2022 1115   LABVLDL 15 04/03/2022 1115      Lab Results  Component Value Date   TSH 3.600 04/03/2022   TSH 2.240 11/13/2021   TSH 1.342 08/19/2013   TSH 1.480 10/16/2006   TSH 1.821 02/26/2006   FREET4 1.15 04/03/2022   FREET4 1.22 11/13/2021      Assessment & Plan:   1. 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 Hyperlipidemia associated with type 2 diabetes mellitus (HCC) 3. Hypertensive heart and CKD, ESRD on dialysis (HCC)   - KINSEY SCULL has currently uncontrolled symptomatic type 2 DM since  82 years of age.  She has missed her follow-up visit since February 2024.  She presents with worsening glycemic profile, recently averaging 220 mg per DL.  Her CGM shows 34% time in range, 28% level 1 hyperglycemia, 35% level 2 hyperglycemia.  She is accompanied by her daughter who claims that patient has hypoglycemia, however her AGP report shows 0% hypoglycemia.  Her previsit labs show A1c of 10.4% increasing from 8.8% during her last visit.    - I had a long discussion with her about the progressive nature of diabetes and the pathology behind its complications. -her diabetes is complicated by ESRD on hemodialysis, obesity/sedentary life and she remains at a high risk for more acute and chronic complications which include CAD, CVA, CKD, retinopathy, and neuropathy. These are all discussed in detail with her.  - I discussed all available options of managing her diabetes including de-escalation of medications. I have counseled her on diet  and weight management  by adopting a Whole Food , Plant Predominant  ( WFPP) nutrition as recommended by  [employer] of Lifestyle Medicine. Patient is encouraged to switch to  unprocessed or minimally processed  complex starch, adequate protein intake (mainly plant source), minimal liquid fat ( mainly vegetable oils), plenty of fruits, and vegetables. -  she is advised to stick to a routine mealtimes to eat 3 complete meals a day and snack only when necessary ( to snack only to correct hypoglycemia BG <70 day time or <100 at night).    - she acknowledges that there is a room for improvement in her food and drink choices. - Further Specific Suggestion is made for her to avoid simple carbohydrates  from her diet including Cakes, Sweet Desserts, Ice Cream, Soda (diet and regular), Sweet Tea, Candies, Chips, Cookies, Store Bought Juices, Alcohol in Excess of  1-2 drinks a day, Artificial Sweeteners,  Coffee Creamer, and "Sugar-free" Products. This will help patient to have more stable blood glucose profile and potentially avoid unintended weight gain.  - she will be scheduled with Norm Salt, RDN, CDE for individualized diabetes education.  - I have approached her with the following plan to manage  her diabetes and patient agrees:   In light of her presentation with significant loss of control of her glycemia, I approached the family to increase her insulin slightly.  She is advised to increase her Humulin 70/30 to 16 units with breakfast and 10 units with supper  only if blood glucose readings above 90 mg per DL and only if she is eating.    She is encouraged to use her CGM continuously.     - Adjustment parameters are given to her for hypo and hyperglycemia in writing. - she is encouraged to call clinic for blood glucose levels less than 70 or  above 200 mg /dl. -She does not have a safe non-insulin treatment options at this time.   - Specific targets for  A1c;  LDL, HDL,  and Triglycerides were discussed with the patient.  2) Blood Pressure /Hypertension:  -Her blood pressure is controlled  to target.  she is advised to continue her current medications including labetalol 100 mg p.o. twice daily.  Blood pressure and management is deferred to nephrology .  3) Lipids/Hyperlipidemia:   Review of her recent lipid panel showed controlled LDL at 63.   She is not on any antilipid treatment.   She will be considered for fasting lipid panel on subsequent visits.   4)  Weight/Diet:  Body mass index is 25.06 kg/m.  -   she is not a candidate for weight loss.  The above detailed WFPP Nutrition will help manage weight as well. Optimal Exercise, Restorative Sleep  information was detailed on discharge instructions.  5) Chronic Care/Health Maintenance:  -she   is encouraged to initiate and continue to follow up with Ophthalmology, Dentist,  Podiatrist at least yearly or according to recommendations, and advised to   stay away from smoking. I have recommended yearly flu vaccine and pneumonia vaccine at least every 5 years; moderate intensity exercise for up to 150 minutes weekly; and  sleep for 7- 9 hours a day.  - she is  advised to maintain close follow up with Mirna Mires, MD for primary care needs, as well as her other providers for optimal and coordinated care.   I spent  26  minutes in the care of the patient today including review of labs from CMP, Lipids, Thyroid Function, Hematology (current and previous including abstractions from other facilities); face-to-face time discussing  her blood glucose readings/logs, discussing hypoglycemia and hyperglycemia episodes and symptoms, medications doses, her options of short and long term treatment based on the latest standards of care / guidelines;  discussion about incorporating lifestyle medicine;  and documenting the encounter. Risk reduction counseling performed per USPSTF guidelines to reduce cardiovascular risk factors.     Please refer to Patient Instructions for Blood Glucose Monitoring and Insulin/Medications Dosing Guide"  in media tab  for additional information. Please  also refer to " Patient Self Inventory" in the Media  tab for reviewed elements of pertinent patient history.  [name] participated in the discussions, expressed understanding, and voiced agreement with the above plans.  All questions were answered to her satisfaction. she is encouraged to contact clinic should she have any questions or concerns prior to her return visit.   Follow up plan: - Return in about 3 months (around 05/26/2023) for Bring Meter/CGM Device/Logs- A1c in Office.  Marquis Lunch, MD [facility] [address] [place], [place] [address] Phone: [phone]  Fax: [phone]    02/24/2023, 1:22 PM  This note was partially dictated with voice recognition software. Similar sounding words can be transcribed inadequately or may not  be corrected upon review.

## 2023-02-24 NOTE — Patient Instructions (Signed)

## 2023-02-25 DIAGNOSIS — N186 End stage renal disease: Secondary | ICD-10-CM | POA: Diagnosis not present

## 2023-02-25 DIAGNOSIS — Z992 Dependence on renal dialysis: Secondary | ICD-10-CM | POA: Diagnosis not present

## 2023-02-26 DIAGNOSIS — Z794 Long term (current) use of insulin: Secondary | ICD-10-CM | POA: Diagnosis not present

## 2023-02-26 DIAGNOSIS — E1122 Type 2 diabetes mellitus with diabetic chronic kidney disease: Secondary | ICD-10-CM | POA: Diagnosis not present

## 2023-02-27 ENCOUNTER — Encounter (HOSPITAL_COMMUNITY): Payer: Self-pay | Admitting: *Deleted

## 2023-02-27 ENCOUNTER — Emergency Department (HOSPITAL_COMMUNITY): Payer: 59

## 2023-02-27 ENCOUNTER — Inpatient Hospital Stay (HOSPITAL_COMMUNITY)
Admission: EM | Admit: 2023-02-27 | Discharge: 2023-03-03 | DRG: 947 | Disposition: A | Discharge status: Home Health | Payer: 59 | Attending: Family Medicine | Admitting: Family Medicine

## 2023-02-27 ENCOUNTER — Telehealth: Payer: Self-pay

## 2023-02-27 ENCOUNTER — Other Ambulatory Visit: Payer: Self-pay

## 2023-02-27 DIAGNOSIS — E1142 Type 2 diabetes mellitus with diabetic polyneuropathy: Secondary | ICD-10-CM | POA: Diagnosis not present

## 2023-02-27 DIAGNOSIS — I6782 Cerebral ischemia: Secondary | ICD-10-CM | POA: Diagnosis not present

## 2023-02-27 DIAGNOSIS — T383X5A Adverse effect of insulin and oral hypoglycemic [antidiabetic] drugs, initial encounter: Secondary | ICD-10-CM | POA: Diagnosis present

## 2023-02-27 DIAGNOSIS — J9811 Atelectasis: Secondary | ICD-10-CM | POA: Diagnosis not present

## 2023-02-27 DIAGNOSIS — I69351 Hemiplegia and hemiparesis following cerebral infarction affecting right dominant side: Secondary | ICD-10-CM | POA: Diagnosis not present

## 2023-02-27 DIAGNOSIS — E1151 Type 2 diabetes mellitus with diabetic peripheral angiopathy without gangrene: Secondary | ICD-10-CM | POA: Diagnosis not present

## 2023-02-27 DIAGNOSIS — Z9842 Cataract extraction status, left eye: Secondary | ICD-10-CM

## 2023-02-27 DIAGNOSIS — D649 Anemia, unspecified: Secondary | ICD-10-CM | POA: Diagnosis present

## 2023-02-27 DIAGNOSIS — R531 Weakness: Secondary | ICD-10-CM | POA: Diagnosis not present

## 2023-02-27 DIAGNOSIS — E11649 Type 2 diabetes mellitus with hypoglycemia without coma: Secondary | ICD-10-CM | POA: Diagnosis not present

## 2023-02-27 DIAGNOSIS — I10 Essential (primary) hypertension: Secondary | ICD-10-CM | POA: Diagnosis present

## 2023-02-27 DIAGNOSIS — W19XXXA Unspecified fall, initial encounter: Secondary | ICD-10-CM | POA: Diagnosis present

## 2023-02-27 DIAGNOSIS — G252 Other specified forms of tremor: Secondary | ICD-10-CM | POA: Diagnosis not present

## 2023-02-27 DIAGNOSIS — R6889 Other general symptoms and signs: Secondary | ICD-10-CM | POA: Diagnosis not present

## 2023-02-27 DIAGNOSIS — Z89421 Acquired absence of other right toe(s): Secondary | ICD-10-CM

## 2023-02-27 DIAGNOSIS — J90 Pleural effusion, not elsewhere classified: Secondary | ICD-10-CM | POA: Diagnosis present

## 2023-02-27 DIAGNOSIS — Z1152 Encounter for screening for COVID-19: Secondary | ICD-10-CM

## 2023-02-27 DIAGNOSIS — Z992 Dependence on renal dialysis: Secondary | ICD-10-CM | POA: Diagnosis not present

## 2023-02-27 DIAGNOSIS — N186 End stage renal disease: Secondary | ICD-10-CM | POA: Diagnosis present

## 2023-02-27 DIAGNOSIS — K5909 Other constipation: Secondary | ICD-10-CM | POA: Diagnosis present

## 2023-02-27 DIAGNOSIS — Z794 Long term (current) use of insulin: Secondary | ICD-10-CM | POA: Diagnosis not present

## 2023-02-27 DIAGNOSIS — Z89411 Acquired absence of right great toe: Secondary | ICD-10-CM | POA: Diagnosis present

## 2023-02-27 DIAGNOSIS — E1165 Type 2 diabetes mellitus with hyperglycemia: Secondary | ICD-10-CM | POA: Diagnosis present

## 2023-02-27 DIAGNOSIS — Z8673 Personal history of transient ischemic attack (TIA), and cerebral infarction without residual deficits: Secondary | ICD-10-CM

## 2023-02-27 DIAGNOSIS — Z79899 Other long term (current) drug therapy: Secondary | ICD-10-CM

## 2023-02-27 DIAGNOSIS — R0600 Dyspnea, unspecified: Secondary | ICD-10-CM | POA: Diagnosis not present

## 2023-02-27 DIAGNOSIS — Z7984 Long term (current) use of oral hypoglycemic drugs: Secondary | ICD-10-CM

## 2023-02-27 DIAGNOSIS — G8191 Hemiplegia, unspecified affecting right dominant side: Secondary | ICD-10-CM

## 2023-02-27 DIAGNOSIS — I517 Cardiomegaly: Secondary | ICD-10-CM | POA: Diagnosis not present

## 2023-02-27 DIAGNOSIS — D631 Anemia in chronic kidney disease: Secondary | ICD-10-CM | POA: Diagnosis present

## 2023-02-27 DIAGNOSIS — E785 Hyperlipidemia, unspecified: Secondary | ICD-10-CM | POA: Diagnosis present

## 2023-02-27 DIAGNOSIS — G9341 Metabolic encephalopathy: Secondary | ICD-10-CM | POA: Diagnosis present

## 2023-02-27 DIAGNOSIS — Z7982 Long term (current) use of aspirin: Secondary | ICD-10-CM

## 2023-02-27 DIAGNOSIS — Z743 Need for continuous supervision: Secondary | ICD-10-CM | POA: Diagnosis not present

## 2023-02-27 DIAGNOSIS — E162 Hypoglycemia, unspecified: Secondary | ICD-10-CM

## 2023-02-27 DIAGNOSIS — G319 Degenerative disease of nervous system, unspecified: Secondary | ICD-10-CM | POA: Diagnosis not present

## 2023-02-27 DIAGNOSIS — R4182 Altered mental status, unspecified: Secondary | ICD-10-CM | POA: Diagnosis present

## 2023-02-27 DIAGNOSIS — K219 Gastro-esophageal reflux disease without esophagitis: Secondary | ICD-10-CM | POA: Diagnosis present

## 2023-02-27 DIAGNOSIS — M898X9 Other specified disorders of bone, unspecified site: Secondary | ICD-10-CM | POA: Diagnosis present

## 2023-02-27 DIAGNOSIS — E1143 Type 2 diabetes mellitus with diabetic autonomic (poly)neuropathy: Secondary | ICD-10-CM | POA: Diagnosis present

## 2023-02-27 DIAGNOSIS — N2581 Secondary hyperparathyroidism of renal origin: Secondary | ICD-10-CM | POA: Diagnosis not present

## 2023-02-27 DIAGNOSIS — R1314 Dysphagia, pharyngoesophageal phase: Secondary | ICD-10-CM | POA: Diagnosis not present

## 2023-02-27 DIAGNOSIS — R1319 Other dysphagia: Secondary | ICD-10-CM | POA: Diagnosis present

## 2023-02-27 DIAGNOSIS — E1122 Type 2 diabetes mellitus with diabetic chronic kidney disease: Secondary | ICD-10-CM | POA: Diagnosis present

## 2023-02-27 DIAGNOSIS — Z888 Allergy status to other drugs, medicaments and biological substances status: Secondary | ICD-10-CM

## 2023-02-27 DIAGNOSIS — Z961 Presence of intraocular lens: Secondary | ICD-10-CM | POA: Diagnosis present

## 2023-02-27 DIAGNOSIS — I120 Hypertensive chronic kidney disease with stage 5 chronic kidney disease or end stage renal disease: Secondary | ICD-10-CM | POA: Diagnosis present

## 2023-02-27 DIAGNOSIS — G253 Myoclonus: Secondary | ICD-10-CM | POA: Diagnosis not present

## 2023-02-27 DIAGNOSIS — R262 Difficulty in walking, not elsewhere classified: Secondary | ICD-10-CM | POA: Diagnosis present

## 2023-02-27 DIAGNOSIS — Z9841 Cataract extraction status, right eye: Secondary | ICD-10-CM

## 2023-02-27 DIAGNOSIS — R29818 Other symptoms and signs involving the nervous system: Secondary | ICD-10-CM | POA: Diagnosis not present

## 2023-02-27 DIAGNOSIS — M48061 Spinal stenosis, lumbar region without neurogenic claudication: Secondary | ICD-10-CM | POA: Diagnosis present

## 2023-02-27 DIAGNOSIS — K3184 Gastroparesis: Secondary | ICD-10-CM | POA: Diagnosis present

## 2023-02-27 LAB — BASIC METABOLIC PANEL
Anion gap: 15 (ref 5–15)
BUN: 29 mg/dL — ABNORMAL HIGH (ref 8–23)
CO2: 29 mmol/L (ref 22–32)
Calcium: 7.8 mg/dL — ABNORMAL LOW (ref 8.9–10.3)
Chloride: 94 mmol/L — ABNORMAL LOW (ref 98–111)
Creatinine, Ser: 4.82 mg/dL — ABNORMAL HIGH (ref 0.44–1.00)
GFR, Estimated: 8 mL/min — ABNORMAL LOW (ref >60)
Glucose, Bld: 96 mg/dL (ref 70–99)
Potassium: 3 mmol/L — ABNORMAL LOW (ref 3.5–5.1)
Sodium: 138 mmol/L (ref 135–145)

## 2023-02-27 LAB — GLUCOSE, CAPILLARY
Glucose-Capillary: 185 mg/dL — ABNORMAL HIGH (ref 70–99)
Glucose-Capillary: 208 mg/dL — ABNORMAL HIGH (ref 70–99)

## 2023-02-27 LAB — CBC
HCT: 33.1 % — ABNORMAL LOW (ref 36.0–46.0)
Hemoglobin: 11 g/dL — ABNORMAL LOW (ref 12.0–15.0)
MCH: 31.3 pg (ref 26.0–34.0)
MCHC: 33.2 g/dL (ref 30.0–36.0)
MCV: 94.3 fL (ref 80.0–100.0)
Platelets: 100 10*3/uL — ABNORMAL LOW (ref 150–400)
RBC: 3.51 MIL/uL — ABNORMAL LOW (ref 3.87–5.11)
RDW: 19.9 % — ABNORMAL HIGH (ref 11.5–15.5)
WBC: 8.2 10*3/uL (ref 4.0–10.5)
nRBC: 0 % (ref 0.0–0.2)

## 2023-02-27 LAB — MAGNESIUM: Magnesium: 1.8 mg/dL (ref 1.7–2.4)

## 2023-02-27 LAB — HEPATITIS B SURFACE ANTIGEN: Hepatitis B Surface Ag: NONREACTIVE

## 2023-02-27 LAB — TROPONIN I (HIGH SENSITIVITY)
Troponin I (High Sensitivity): 23 ng/L — ABNORMAL HIGH (ref <18)
Troponin I (High Sensitivity): 23 ng/L — ABNORMAL HIGH (ref <18)

## 2023-02-27 LAB — BRAIN NATRIURETIC PEPTIDE: B Natriuretic Peptide: 976 pg/mL — ABNORMAL HIGH (ref 0.0–100.0)

## 2023-02-27 LAB — MRSA NEXT GEN BY PCR, NASAL: MRSA by PCR Next Gen: NOT DETECTED

## 2023-02-27 LAB — SARS CORONAVIRUS 2 BY RT PCR: SARS Coronavirus 2 by RT PCR: NEGATIVE

## 2023-02-27 MED ORDER — LABETALOL HCL 200 MG PO TABS
200.0000 mg | ORAL_TABLET | Freq: Two times a day (BID) | ORAL | Status: DC
  Administered 2023-02-28 – 2023-03-03 (×5): 200 mg via ORAL
  Filled 2023-02-27 (×8): qty 1

## 2023-02-27 MED ORDER — ACETAMINOPHEN 650 MG RE SUPP: 650.0000 mg | Freq: Four times a day (QID) | RECTAL | Status: DC | PRN

## 2023-02-27 MED ORDER — ONDANSETRON HCL 4 MG PO TABS: 4.0000 mg | ORAL_TABLET | Freq: Four times a day (QID) | ORAL | Status: DC | PRN

## 2023-02-27 MED ORDER — HEPARIN SODIUM (PORCINE) 5000 UNIT/ML IJ SOLN
5000.0000 [IU] | Freq: Three times a day (TID) | INTRAMUSCULAR | Status: DC
  Administered 2023-02-27 – 2023-03-03 (×10): 5000 [IU] via SUBCUTANEOUS
  Filled 2023-02-27 (×10): qty 1

## 2023-02-27 MED ORDER — BISACODYL 5 MG PO TBEC: 5.0000 mg | DELAYED_RELEASE_TABLET | Freq: Every day | ORAL | Status: DC | PRN

## 2023-02-27 MED ORDER — HYDROXYZINE HCL 10 MG PO TABS
10.0000 mg | ORAL_TABLET | Freq: Three times a day (TID) | ORAL | Status: DC | PRN
  Administered 2023-02-27 – 2023-03-02 (×4): 10 mg via ORAL
  Filled 2023-02-27 (×5): qty 1

## 2023-02-27 MED ORDER — ACETAMINOPHEN 325 MG PO TABS: 650.0000 mg | ORAL_TABLET | Freq: Four times a day (QID) | ORAL | Status: DC | PRN

## 2023-02-27 MED ORDER — ONDANSETRON HCL 4 MG/2ML IJ SOLN
4.0000 mg | Freq: Four times a day (QID) | INTRAMUSCULAR | Status: DC | PRN
  Administered 2023-03-03: 4 mg via INTRAVENOUS
  Filled 2023-02-27: qty 2

## 2023-02-27 MED ORDER — FENTANYL CITRATE PF 50 MCG/ML IJ SOSY
12.5000 ug | PREFILLED_SYRINGE | INTRAMUSCULAR | Status: DC | PRN
  Administered 2023-03-01 – 2023-03-02 (×2): 12.5 ug via INTRAVENOUS
  Filled 2023-02-27 (×2): qty 1

## 2023-02-27 MED ORDER — RENA-VITE PO TABS
1.0000 | ORAL_TABLET | Freq: Every day | ORAL | Status: DC
  Administered 2023-02-27 – 2023-03-02 (×4): 1 via ORAL
  Filled 2023-02-27 (×4): qty 1

## 2023-02-27 MED ORDER — ASPIRIN 81 MG PO TBEC
81.0000 mg | DELAYED_RELEASE_TABLET | Freq: Every day | ORAL | Status: DC
  Administered 2023-02-27 – 2023-03-03 (×5): 81 mg via ORAL
  Filled 2023-02-27 (×5): qty 1

## 2023-02-27 MED ORDER — PANTOPRAZOLE SODIUM 40 MG PO TBEC
40.0000 mg | DELAYED_RELEASE_TABLET | Freq: Two times a day (BID) | ORAL | Status: DC
  Administered 2023-02-27 – 2023-03-03 (×9): 40 mg via ORAL
  Filled 2023-02-27 (×9): qty 1

## 2023-02-27 MED ORDER — INSULIN ASPART 100 UNIT/ML IJ SOLN
0.0000 [IU] | Freq: Three times a day (TID) | INTRAMUSCULAR | Status: DC
  Administered 2023-02-27: 1 [IU] via SUBCUTANEOUS
  Administered 2023-03-01 (×2): 3 [IU] via SUBCUTANEOUS
  Administered 2023-03-02: 1 [IU] via SUBCUTANEOUS
  Administered 2023-03-03: 2 [IU] via SUBCUTANEOUS
  Administered 2023-03-03: 4 [IU] via SUBCUTANEOUS

## 2023-02-27 MED ORDER — INSULIN ASPART PROT & ASPART (70-30 MIX) 100 UNIT/ML ~~LOC~~ SUSP
10.0000 [IU] | Freq: Two times a day (BID) | SUBCUTANEOUS | Status: DC
  Administered 2023-02-27: 10 [IU] via SUBCUTANEOUS
  Filled 2023-02-27: qty 10

## 2023-02-27 MED ORDER — ALBUTEROL SULFATE (2.5 MG/3ML) 0.083% IN NEBU: 2.5000 mg | INHALATION_SOLUTION | RESPIRATORY_TRACT | Status: DC | PRN

## 2023-02-27 MED ORDER — HYDROCODONE-ACETAMINOPHEN 5-325 MG PO TABS
1.0000 | ORAL_TABLET | Freq: Four times a day (QID) | ORAL | Status: DC | PRN
  Administered 2023-02-27 – 2023-03-02 (×5): 1 via ORAL
  Filled 2023-02-27 (×6): qty 1

## 2023-02-27 MED ORDER — POTASSIUM CHLORIDE CRYS ER 20 MEQ PO TBCR
40.0000 meq | EXTENDED_RELEASE_TABLET | Freq: Once | ORAL | Status: AC
  Administered 2023-02-27: 40 meq via ORAL
  Filled 2023-02-27: qty 2

## 2023-02-27 NOTE — H&P (Signed)
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  She reportedly has been feeling bad for about 1 week very fatigued and having difficulty ambulating.  Family called EMS because they were not able to get her up today.  EMS tried to get her up and she slumped to the side of the bed.  She needed assistance just to sit up.  She did not have her dialysis treatment today but she is normally very compliant with her treatments.  Patient denies any pain symptoms.  When trying to stand up her legs started trembling when she tried to take a step and has been unable to take another step.  Her labs look pretty stable on arrival with a potassium of 3.0.  Her SARS 2 coronavirus test was negative.  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  She has been too weakened to go home and admission requested for further evaluation and management.      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    Dialysis patient (HCC)    M, W, F   Fistula    R arm   GERD (gastroesophageal reflux disease)    Hypertension    Renal disorder    Shortness of breath    Stroke [facility])    right side weakness    Past Surgical History:  Procedure Laterality Date   AMPUTATION Right 01/13/2020   Procedure: AMPUTATION RIGHT FOOT 1ST RAY;  Surgeon: Nadara Mustard, MD;  Location: [facility] OR;  Service: Orthopedics;  Laterality: Right;   AV FISTULA PLACEMENT Right 09/08/2013   Procedure: CREATION OF RIGHT BRACHIAL CEPHALIC ARTERIOVENOUS FISTULA ;  Surgeon: Pryor Ochoa, MD;  Location: [facility] OR;  Service: Vascular;  Laterality: Right;   BASCILIC VEIN TRANSPOSITION Right 01/26/2014   Procedure: Right Arm BASILIC VEIN TRANSPOSITION;  Surgeon: Pryor Ochoa, MD;  Location: [facility] OR;  Service: Vascular;  Laterality: Right;   BIOPSY  09/10/2021   Procedure: BIOPSY;  Surgeon: Dolores Frame, MD;  [facility];  Service: Gastroenterology;;  gastric   BIOPSY  10/07/2021   Procedure: BIOPSY;  Surgeon: Lemar Lofty., MD;  Location: [facility];  Service: Gastroenterology;;   BIOPSY  04/17/2022   Procedure: BIOPSY;  Surgeon: Lemar Lofty., MD;  Location: [facility];  Service: Gastroenterology;;   BIOPSY  02/03/2023   Procedure: BIOPSY;  Surgeon: Dolores Frame, MD;  [facility];  Service: Gastroenterology;;   CATARACT EXTRACTION W/PHACO  11/20/2011   Procedure: CATARACT  EXTRACTION PHACO AND INTRAOCULAR LENS PLACEMENT (IOC);  Surgeon: Gemma Payor, MD;  Location: [facility] ORS;  Service: Ophthalmology;  Laterality: Right;  CDE 18.82   CATARACT EXTRACTION W/PHACO Left 11/18/2012   Procedure: CATARACT EXTRACTION PHACO AND INTRAOCULAR LENS PLACEMENT (IOC);  Surgeon: Gemma Payor, MD;  Location: [facility] ORS;  Service: Ophthalmology;  Laterality: Left;  CDE: 18.97   COLONOSCOPY N/A 02/09/2013   Procedure: COLONOSCOPY;  Surgeon: Malissa Hippo, MD;  [facility];  Service: Endoscopy;   Laterality: N/A;  305-moved to 220 Ann to notify pt   COLONOSCOPY N/A 07/08/2018   Procedure: COLONOSCOPY;  Surgeon: Malissa Hippo, MD;  [facility];  Service: Endoscopy;  Laterality: N/A;  930   ENDOSCOPIC MUCOSAL RESECTION N/A 10/07/2021   Procedure: ENDOSCOPIC MUCOSAL RESECTION;  Surgeon: Meridee Score Netty Starring., MD;  Location: [facility];  Service: Gastroenterology;  Laterality: N/A;   ESOPHAGEAL DILATION N/A 02/03/2023   Procedure: ESOPHAGEAL DILATION;  Surgeon: Dolores Frame, MD;  [facility];  Service: Gastroenterology;  Laterality: N/A;  7:30AM;ASA 3   ESOPHAGOGASTRODUODENOSCOPY N/A 04/17/2022   Procedure: ESOPHAGOGASTRODUODENOSCOPY (EGD);  Surgeon: Lemar Lofty., MD;  Location: [facility];  Service: Gastroenterology;  Laterality: N/A;   ESOPHAGOGASTRODUODENOSCOPY (EGD) WITH PROPOFOL N/A 09/10/2021   Procedure: ESOPHAGOGASTRODUODENOSCOPY (EGD) WITH PROPOFOL;  Surgeon: Dolores Frame, MD;  [facility];  Service: Gastroenterology;  Laterality: N/A;   ESOPHAGOGASTRODUODENOSCOPY (EGD) WITH PROPOFOL N/A 10/07/2021   Procedure: ESOPHAGOGASTRODUODENOSCOPY (EGD) WITH PROPOFOL;  Surgeon: Meridee Score Netty Starring., MD;  Location: [facility];  Service: Gastroenterology;  Laterality: N/A;   ESOPHAGOGASTRODUODENOSCOPY (EGD) WITH PROPOFOL N/A 02/03/2023   Procedure: ESOPHAGOGASTRODUODENOSCOPY (EGD) WITH PROPOFOL;  Surgeon: Dolores Frame, MD;  [facility];  Service: Gastroenterology;  Laterality: N/A;  7:30AM;ASA 3   EUS N/A 10/07/2021   Procedure: UPPER ENDOSCOPIC ULTRASOUND (EUS) RADIAL;  Surgeon: Lemar Lofty., MD;  Location: [facility];  Service: Gastroenterology;  Laterality: N/A;   GASTRIC VARICES BANDING  10/07/2021   Procedure: duodenal tumor BANDING;  Surgeon: Meridee Score Netty Starring., MD;  Location: [facility];  Service: Gastroenterology;;  with emr kit   HEMOSTASIS CLIP PLACEMENT  10/07/2021    Procedure: HEMOSTASIS CLIP PLACEMENT;  Surgeon: Lemar Lofty., MD;  Location: [facility];  Service: Gastroenterology;;   INSERTION OF DIALYSIS CATHETER Right 06/24/2013   Procedure: INSERTION OF DIALYSIS CATHETER: Ultrasound guided;  Surgeon: Nada Libman, MD;  Location: [facility] OR;  Service: Vascular;  Laterality: Right;   LIGATION OF ARTERIOVENOUS  FISTULA Right 01/26/2014   Procedure: LIGATION OF ARTERIOVENOUS  FISTULA;  Surgeon: Pryor Ochoa, MD;  Location: [facility] OR;  Service: Vascular;  Laterality: Right;   LOOP RECORDER IMPLANT N/A 07/21/2013   Procedure: LOOP RECORDER IMPLANT;  Surgeon: Marinus Maw, MD;  Location: [facility] CATH LAB;  Service: Cardiovascular;  Laterality: N/A;   POLYPECTOMY  07/08/2018   Procedure: POLYPECTOMY;  Surgeon: Malissa Hippo, MD;  [facility];  Service: Endoscopy;;  Descending colon polyps x 2    SUBMUCOSAL LIFTING INJECTION  10/07/2021   Procedure: SUBMUCOSAL LIFTING INJECTION;  Surgeon: Lemar Lofty., MD;  Location: [facility];  Service: Gastroenterology;;   TEE WITHOUT CARDIOVERSION N/A 07/21/2013   Procedure: TRANSESOPHAGEAL ECHOCARDIOGRAM (TEE);  Surgeon: Lars Masson, MD;  Location: [facility] ENDOSCOPY;  Service: Cardiovascular;  Laterality: N/A;   UPPER ESOPHAGEAL ENDOSCOPIC ULTRASOUND (EUS) N/A 04/17/2022   Procedure: UPPER ESOPHAGEAL ENDOSCOPIC ULTRASOUND (EUS);  Surgeon: Lemar Lofty., MD;  Location:  [facility];  Service: Gastroenterology;  Laterality: N/A;     reports that she has never smoked. She has never been exposed to tobacco smoke. She has never used smokeless tobacco. She reports that she does not drink alcohol and does not use drugs.  Allergies  Allergen Reactions   Ambien [Zolpidem] Other (See Comments)    Hallucinations    Reglan [Metoclopramide] Other (See Comments)    "makes me crazy"    Family History  Problem Relation Age of Onset   Cancer Sister    Cancer Brother    Anesthesia problems Neg  Hx    Hypotension Neg Hx    Malignant hyperthermia Neg Hx    Pseudochol deficiency Neg Hx     Prior to Admission medications   Medication Sig Start Date End Date Taking? Authorizing Provider  acetaminophen (TYLENOL) 500 MG tablet Take 500 mg by mouth at bedtime.    [provider]  albuterol (VENTOLIN HFA) 108 (90 Base) MCG/ACT inhaler Inhale 2 puffs into the lungs every 6 (six) hours as needed for wheezing or shortness of breath. 11/21/20   Sharee Holster, NP  aspirin EC 81 MG tablet Take 1 tablet (81 mg total) by mouth daily. Swallow whole. 01/10/23 01/10/24  Sherryll Burger, Pratik D, DO  cyanocobalamin (,VITAMIN B-12,) 1000 MCG/ML injection Inject 1,000 mcg into the muscle every 30 (thirty) days. 11/01/14   [provider]  diclofenac Sodium (VOLTAREN) 1 % GEL Apply 4 g topically 4 (four) times daily as needed. Apply to bilateral knees 03/16/21   Bing Neighbors, NP  HUMULIN 70/30 (70-30) 100 UNIT/ML injection Inject 10-16 Units into the skin See admin instructions. Inject 16 units into the skin every morning with breakfast and 10 units  with supper only when glucose is above 90. 02/24/23   Nida, Denman George, MD  labetalol (NORMODYNE) 200 MG tablet Take 200 mg by mouth 2 (two) times daily. 01/06/23   [provider]  multivitamin (RENA-VIT) TABS tablet Take 1 tablet by mouth at bedtime. 08/21/14   Kirsteins, Victorino Sparrow, MD  omeprazole (PRILOSEC) 40 MG capsule TAKE ONE CAPSULE BY MOUTH TWICE DAILY BEFORE A meal Patient taking differently: Take 40 mg by mouth in the morning and at bedtime. 04/16/22   Mansouraty, Netty Starring., MD  pregabalin (LYRICA) 25 MG capsule Take 1 capsule (25 mg total) by mouth daily. 02/17/23   Erick Colace, MD    Physical Exam: Vitals:   02/27/23 0647 02/27/23 0700 02/27/23 0730 02/27/23 1015  BP: (!) 109/56 (!) 104/59 118/76 132/68  Pulse: 72 71 66 72  Resp: 16 12 15 13   Temp: 97.8 F (36.6 C)     TempSrc: Oral     SpO2: 100% 100% 100% 100%    Constitutional: frail, elderly female, awake, alert, cooperative, oriented x 3, NAD, calm, comfortable Eyes: PERRL, lids and conjunctivae normal ENMT: Mucous membranes are pale, moist. Posterior pharynx clear of any exudate or lesions.  Neck: normal, supple, no masses, no thyromegaly Respiratory:  no crackles. Normal respiratory effort. No accessory muscle use.  Cardiovascular: normal s1, s2 sounds, No extremity edema. 2+ pedal pulses. No carotid bruits.  Abdomen: no tenderness, no masses palpated. No hepatosplenomegaly. Bowel sounds positive.  Musculoskeletal: healed old right 1st toe amputation. no clubbing / cyanosis. No joint deformity upper and lower extremities. Good ROM, no contractures. Normal muscle tone.  Skin: age related changes of long-term ESRD Neurologic: right  hemiparesis. CN 2-12 grossly intact. Sensation intact.   Psychiatric:  Normal judgment and insight. Alert and oriented x 3. Normal mood.   Labs on Admission: I have personally reviewed following labs and imaging studies  CBC: Recent Labs  Lab 02/27/23 0657  WBC 8.2  HGB 11.0*  HCT 33.1*  MCV 94.3  PLT 100*   Basic Metabolic Panel: Recent Labs  Lab 02/27/23 0657  NA 138  K 3.0*  CL 94*  CO2 29  GLUCOSE 96  BUN 29*  CREATININE 4.82*  CALCIUM 7.8*  MG 1.8   GFR: Estimated Creatinine Clearance: 8.6 mL/min (A) (by C-G formula based on SCr of 4.82 mg/dL (H)). Liver Function Tests: No results for input(s): "AST", "ALT", "ALKPHOS", "BILITOT", "PROT", "ALBUMIN" in the last 168 hours. No results for input(s): "LIPASE", "AMYLASE" in the last 168 hours. No results for input(s): "AMMONIA" in the last 168 hours. Coagulation Profile: No results for input(s): "INR", "PROTIME" in the last 168 hours. Cardiac Enzymes: No results for input(s): "CKTOTAL", "CKMB", "CKMBINDEX", "TROPONINI" in the last 168 hours. BNP (last 3 results) No results for input(s): "PROBNP" in the last 8760 hours. HbA1C: No results for  input(s): "HGBA1C" in the last 72 hours. CBG: No results for input(s): "GLUCAP" in the last 168 hours. Lipid Profile: No results for input(s): "CHOL", "HDL", "LDLCALC", "TRIG", "CHOLHDL", "LDLDIRECT" in the last 72 hours. Thyroid Function Tests: No results for input(s): "TSH", "T4TOTAL", "FREET4", "T3FREE", "THYROIDAB" in the last 72 hours. Anemia Panel: No results for input(s): "VITAMINB12", "FOLATE", "FERRITIN", "TIBC", "IRON", "RETICCTPCT" in the last 72 hours. Urine analysis:    Component Value Date/Time   COLORURINE YELLOW 10/15/2017 2244   APPEARANCEUR HAZY (A) 10/15/2017 2244   LABSPEC 1.015 10/15/2017 2244   PHURINE 7.0 10/15/2017 2244   GLUCOSEU >=500 (A) 10/15/2017 2244   HGBUR SMALL (A) 10/15/2017 2244   BILIRUBINUR NEGATIVE 10/15/2017 2244   KETONESUR NEGATIVE 10/15/2017 2244   PROTEINUR 100 (A) 10/15/2017 2244   UROBILINOGEN 0.2 11/19/2014 0909   NITRITE NEGATIVE 10/15/2017 2244   LEUKOCYTESUR MODERATE (A) 10/15/2017 2244    Radiological Exams on Admission: CT Head Wo Contrast  Result Date: 02/27/2023 CLINICAL DATA:  Neuro deficit, acute, stroke suspected. Lower extremity weakness. EXAM: CT HEAD WITHOUT CONTRAST TECHNIQUE: Contiguous axial images were obtained from the base of the skull through the vertex without intravenous contrast. RADIATION DOSE REDUCTION: This exam was performed according to the departmental dose-optimization program which includes automated exposure control, adjustment of the mA and/or kV according to patient size and/or use of iterative reconstruction technique. COMPARISON:  Head CT 06/14/2022. FINDINGS: Brain: No acute hemorrhage. Unchanged severe chronic small-vessel disease with old infarct in the left superior parietal lobe. Cortical gray-white differentiation is otherwise preserved. Prominence of the ventricles and sulci within expected range for age. No hydrocephalus or extra-axial collection. No mass effect or midline shift. Vascular: No  hyperdense vessel or unexpected calcification. Skull: No calvarial fracture or suspicious bone lesion. Skull base is unremarkable. Sinuses/Orbits: No acute finding. Other: None. IMPRESSION: 1. No acute intracranial abnormality. 2. Unchanged severe chronic small-vessel disease with old infarct in the left superior parietal lobe. Electronically Signed   By: Orvan Falconer M.D.   On: 02/27/2023 08:34   DG Chest 2 View  Result Date: 02/27/2023 CLINICAL DATA:  Dyspnea. EXAM: CHEST - 2 VIEW COMPARISON:  July 04, 2022. FINDINGS: Stable cardiomegaly. Small bilateral pleural effusions are noted with associated bibasilar subsegmental atelectasis. Bony thorax is unremarkable. IMPRESSION: Small pleural effusions with associated bibasilar subsegmental atelectasis. Electronically Signed   By: Roque Lias  Jr M.D.   On: 02/27/2023 08:28    EKG: Independently reviewed.   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)   Chronic constipation   Esophageal dysphagia   Spinal stenosis of lumbar region   Gastro-esophageal reflux disease without esophagitis   Type 2 diabetes mellitus with hyperglycemia (HCC)   Fall   Pleural effusion   Generalized Weakness  - suspect mostly due to medication side effect as this occurred shortly after starting pregabalin - DC pregabalin - PT evaluation - MRI with no finding of acute CVA -  hemodialysis on schedule   ESRD on HD - pt reports excellent compliance with HD treatments - nephrology consulted for inpatient HD  - resume home medications when reconciled  History of CVA with residual chronic right hemiparesis - CT head and MRI negative for acute CVA  - continue current medical management for secondary prevention   GERD  - resume PPI therapy for GI protection   Esophageal Dysphagia  - pt requesting a soft foods diet for better digestion  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 Fransico Him  - continue 70/30 insulin per Dr. Fransico Him - monitor CBG closely while in hospital   Anemia in CKD  - Hg stable from recent testing - obtain CBC in AM   Hyperlipidemia  - resume home lipid lowering medication  Pleural Effusion  - chronic and asymptomatic, monitor  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 - stop pregabalin due to adverse side effects  Chronic constipation  - laxatives   DVT prophylaxis: SQ heparin   Code Status: Full   Family Communication:   Disposition Plan: anticipate return home but pending PT eval   Consults called: nephrology   Admission status: IP   Level of care: Telemetry Standley Dakins MD [facility] How to contact the [facility] Attending or Consulting provider 7A - 7P or covering provider during after hours 7P -7A, for this patient?  Check the care team in [facility] and look for a) attending/consulting [facility] provider listed and b) the [name] team listed Log into [email or web address] and use [facility] universal password to access. If you do not have the password, please contact the hospital operator. Locate the [facility] provider you are looking for under [facility] and page to a number that you can be directly reached. If you still have difficulty reaching the provider, please page the [facility] (Director on Call) for the Hospitalists listed on amion for assistance.   If 7PM-7AM, please contact  night-coverage [email or web address] Password [facility]  02/27/2023, 12:59 PM

## 2023-02-27 NOTE — ED Triage Notes (Signed)
Per [facility] report:  Family called out for sick person and not able to get her up. On arrival by EMS, she had slipped into floor on side of bed, sitting slumped to the side, helped her sit up. Dialysis pt, not missed any cbg 144, 107/55, HR 70's, 97.3, 99% ra "feeling bad" for about a week, tired. Denies pain.

## 2023-02-27 NOTE — Plan of Care (Signed)

## 2023-02-27 NOTE — Telephone Encounter (Signed)
Patient experienced jerking and not able to walk, patient has been admitted to hospital and she was advised to stop taking the medicine and wanted to make you aware and see if anything else will be prescribed.

## 2023-02-27 NOTE — ED Notes (Signed)
Pt ambulated with walker. Pt stood out of bed, took one step and upon trying to take second step, leg started trembling and pt was unable to take next step. Pt was placed back in bed and staff notified.

## 2023-02-27 NOTE — ED Provider Notes (Signed)
[place] EMERGENCY DEPARTMENT AT [facility] Provider Note   CSN: [id number] Arrival date & time: 02/27/23  0636     History  Chief Complaint  Patient presents with   Weakness    [name] is a 82 y.o. female.  HPI 82 year old female presents with weakness.  This morning she slipped out onto the floor and EMS had to help her get up.  She has been feeling weak since this morning.  She states she transiently felt short of breath but no longer feels short of breath and did not have any chest pain.  She has not had a headache.  She tells me that her right leg has been numb and weak for over a month.  However this morning it seems worse but also her left leg seemed weak.  She denies any upper extremity weakness.  No fevers, cough.  She does not urinate.  She is due to go to dialysis today and last went 2 days ago.  She has not had any diarrhea or vomiting.  Home Medications Prior to Admission medications   Medication Sig Start Date End Date Taking? Authorizing Provider  acetaminophen (TYLENOL) 500 MG tablet Take 500 mg by mouth at bedtime.    [provider]  albuterol (VENTOLIN HFA) 108 (90 Base) MCG/ACT inhaler Inhale 2 puffs into the lungs every 6 (six) hours as needed for wheezing or shortness of breath. 11/21/20   Sharee Holster, NP  aspirin EC 81 MG tablet Take 1 tablet (81 mg total) by mouth daily. Swallow whole. 01/10/23 01/10/24  Sherryll Burger, Pratik D, DO  cyanocobalamin (,VITAMIN B-12,) 1000 MCG/ML injection Inject 1,000 mcg into the muscle every 30 (thirty) days. 11/01/14   [provider]  diclofenac Sodium (VOLTAREN) 1 % GEL Apply 4 g topically 4 (four) times daily as needed. Apply to bilateral knees 03/16/21   Bing Neighbors, NP  HUMULIN 70/30 (70-30) 100 UNIT/ML injection Inject 10-16 Units into the skin See admin instructions. Inject 16 units into the skin every morning with breakfast and 10 units  with supper only when glucose is above 90.  02/24/23   Nida, Denman George, MD  labetalol (NORMODYNE) 200 MG tablet Take 200 mg by mouth 2 (two) times daily. 01/06/23   [provider]  multivitamin (RENA-VIT) TABS tablet Take 1 tablet by mouth at bedtime. 08/21/14   Kirsteins, Victorino Sparrow, MD  omeprazole (PRILOSEC) 40 MG capsule TAKE ONE CAPSULE BY MOUTH TWICE DAILY BEFORE A meal Patient taking differently: Take 40 mg by mouth in the morning and at bedtime. 04/16/22   Mansouraty, Netty Starring., MD  pregabalin (LYRICA) 25 MG capsule Take 1 capsule (25 mg total) by mouth daily. 02/17/23   Kirsteins, Victorino Sparrow, MD      Allergies    Ambien [zolpidem] and Reglan [metoclopramide]    Review of Systems   Review of Systems  Constitutional:  Negative for fever.  Respiratory:  Positive for shortness of breath. Negative for cough.   Cardiovascular:  Negative for chest pain.  Gastrointestinal:  Negative for abdominal pain, diarrhea and vomiting.  Neurological:  Positive for weakness. Negative for headaches.    Physical Exam Updated Vital Signs BP 132/68   Pulse 72   Temp 97.8 F (36.6 C) (Oral)   Resp 13   SpO2 100%  Physical Exam Vitals and nursing note reviewed.  Constitutional:      Appearance: She is well-developed.  HENT:     Head: Normocephalic and atraumatic.  Eyes:     Extraocular Movements: Extraocular movements intact.     Pupils: Pupils are equal, round, and reactive to light.  Cardiovascular:     Rate and Rhythm: Normal rate and regular rhythm.     Pulses:          Posterior tibial pulses are detected w/ Doppler on the right side.     Heart sounds: Normal heart sounds.  Pulmonary:     Effort: Pulmonary effort is normal.     Breath sounds: Normal breath sounds.  Abdominal:     Palpations: Abdomen is soft.     Tenderness: There is no abdominal tenderness.  Skin:    General: Skin is warm and dry.  Neurological:     Mental Status: She is alert.     Comments: CN 3-12 grossly intact. 5/5 strength in both upper  extremities. 4/5 strength in both lower extremities, though the right is mildly weaker. Normal finger to nose.      ED Results / Procedures / Treatments   Labs (all labs ordered are listed, but only abnormal results are displayed) Labs Reviewed  BASIC METABOLIC PANEL - Abnormal; Notable for the following components:      Result Value   Potassium 3.0 (*)    Chloride 94 (*)    BUN 29 (*)    Creatinine, Ser 4.82 (*)    Calcium 7.8 (*)    GFR, Estimated 8 (*)    All other components within normal limits  CBC - Abnormal; Notable for the following components:   RBC 3.51 (*)    Hemoglobin 11.0 (*)    HCT 33.1 (*)    RDW 19.9 (*)    Platelets 100 (*)    All other components within normal limits  BRAIN NATRIURETIC PEPTIDE - Abnormal; Notable for the following components:   B Natriuretic Peptide 976.0 (*)    All other components within normal limits  TROPONIN I (HIGH SENSITIVITY) - Abnormal; Notable for the following components:   Troponin I (High Sensitivity) 23 (*)    All other components within normal limits  TROPONIN I (HIGH SENSITIVITY) - Abnormal; Notable for the following components:   Troponin I (High Sensitivity) 23 (*)    All other components within normal limits  SARS CORONAVIRUS 2 BY RT PCR  MAGNESIUM  HEPATITIS B SURFACE ANTIGEN  HEPATITIS B SURFACE ANTIBODY, QUANTITATIVE    EKG EKG Interpretation Date/Time:  Friday February 27 2023 06:48:02 EDT Ventricular Rate:  72 PR Interval:  147 QRS Duration:  82 QT Interval:  443 QTC Calculation: 485 R Axis:   76  Text Interpretation: Sinus rhythm Low voltage, extremity leads Borderline repolarization abnormality no significant change since July 2024 Confirmed by Pricilla Loveless [phone]) on 02/27/2023 7:03:40 AM  Radiology MR BRAIN WO CONTRAST  Result Date: 02/27/2023 CLINICAL DATA:  Provided history: Neuro deficit, acute, stroke suspected. EXAM: MRI HEAD WITHOUT CONTRAST TECHNIQUE: Multiplanar, multiecho pulse sequences  of the brain and surrounding structures were obtained without intravenous contrast. COMPARISON:  Head CT 02/27/2023.  Brain MRI 01/10/2020. FINDINGS: Mild intermittent motion degradation. Brain: Generalized parenchymal atrophy. Redemonstrated small chronic cortical/subcortical infarct within the left parietal lobe. Advanced patchy and confluent T2 FLAIR hyperintense signal abnormality within the cerebral white matter, nonspecific but compatible with chronic small vessel ischemic disease. There are superimposed chronic white matter infarcts within the bilateral cerebral hemispheres, the majority of which were better appreciated on the prior brain MRI of 08/20/2013 (acute at that time). Moderate chronic small vessel  ischemic changes within the pons. Multiple chronic infarcts within the cerebellum, the largest within the left hemisphere measuring 13 mm (series 10, image 6). There are a few punctate chronic microhemorrhages scattered within the supratentorial brain and cerebellum. There is no acute infarct. No evidence of an intracranial mass. No extra-axial fluid collection. No midline shift. Vascular: Maintained flow voids within the proximal large arterial vessels. Skull and upper cervical spine: No focal suspicious marrow lesion. Sinuses/Orbits: No mass or acute finding within the imaged orbits. Prior bilateral ocular lens replacement. No significant paranasal sinus disease. IMPRESSION: 1. Mildly motion degraded exam. 2. No evidence of an acute intracranial abnormality. 3. Redemonstrated small chronic cortical/subcortical infarct within the left parietal lobe. 4. Extensive chronic ischemic gliosis within the cerebral white matter, similar to the prior brain MRI of 01/10/2020. Moderate chronic small vessel ischemic changes within the pons, also similar to this prior exam. 5. Chronic infarcts within bilateral cerebral hemispheres, unchanged. 6. Generalized parenchymal atrophy. Electronically Signed   By: Jackey Loge  D.O.   On: 02/27/2023 13:23   CT Head Wo Contrast  Result Date: 02/27/2023 CLINICAL DATA:  Neuro deficit, acute, stroke suspected. Lower extremity weakness. EXAM: CT HEAD WITHOUT CONTRAST TECHNIQUE: Contiguous axial images were obtained from the base of the skull through the vertex without intravenous contrast. RADIATION DOSE REDUCTION: This exam was performed according to the departmental dose-optimization program which includes automated exposure control, adjustment of the mA and/or kV according to patient size and/or use of iterative reconstruction technique. COMPARISON:  Head CT 06/14/2022. FINDINGS: Brain: No acute hemorrhage. Unchanged severe chronic small-vessel disease with old infarct in the left superior parietal lobe. Cortical gray-white differentiation is otherwise preserved. Prominence of the ventricles and sulci within expected range for age. No hydrocephalus or extra-axial collection. No mass effect or midline shift. Vascular: No hyperdense vessel or unexpected calcification. Skull: No calvarial fracture or suspicious bone lesion. Skull base is unremarkable. Sinuses/Orbits: No acute finding. Other: None. IMPRESSION: 1. No acute intracranial abnormality. 2. Unchanged severe chronic small-vessel disease with old infarct in the left superior parietal lobe. Electronically Signed   By: Orvan Falconer M.D.   On: 02/27/2023 08:34   DG Chest 2 View  Result Date: 02/27/2023 CLINICAL DATA:  Dyspnea. EXAM: CHEST - 2 VIEW COMPARISON:  July 04, 2022. FINDINGS: Stable cardiomegaly. Small bilateral pleural effusions are noted with associated bibasilar subsegmental atelectasis. Bony thorax is unremarkable. IMPRESSION: Small pleural effusions with associated bibasilar subsegmental atelectasis. Electronically Signed   By: Lupita Raider M.D.   On: 02/27/2023 08:28    Procedures Procedures    Medications Ordered in ED Medications  insulin aspart (novoLOG) injection 0-6 Units (has no administration  in time range)  heparin injection 5,000 Units (has no administration in time range)  acetaminophen (TYLENOL) tablet 650 mg (has no administration in time range)    Or  acetaminophen (TYLENOL) suppository 650 mg (has no administration in time range)  fentaNYL (SUBLIMAZE) injection 12.5 mcg (has no administration in time range)  HYDROcodone-acetaminophen (NORCO/VICODIN) 5-325 MG per tablet 1 tablet (has no administration in time range)  bisacodyl (DULCOLAX) EC tablet 5 mg (has no administration in time range)  ondansetron (ZOFRAN) tablet 4 mg (has no administration in time range)    Or  ondansetron (ZOFRAN) injection 4 mg (has no administration in time range)  pantoprazole (PROTONIX) EC tablet 40 mg (has no administration in time range)  potassium chloride SA (KLOR-CON M) CR tablet 40 mEq (40 mEq Oral Given 02/27/23 1313)  ED Course/ Medical Decision Making/ A&P                                 Medical Decision Making Amount and/or Complexity of Data Reviewed Independent Historian:     Details: Daughter Labs: ordered.    Details: Mild hypokalemia.  Elevated BNP.  Chronic anemia. Radiology: ordered.    Details: No head bleed on CT head or stroke on MRI. Small pleural effusions ECG/medicine tests: ordered and independent interpretation performed.    Details: No acute ischemia.  Risk Prescription drug management. Decision regarding hospitalization.   Patient presents with weakness.  Seems to be bilateral in her legs of the right is worse than the left (however she chronically has right leg weakness).  Workup shows some mild to moderate hypokalemia at 3.0.  She has not had dialysis since 2 days ago.  MRI shows no acute stroke.  However she was unable to walk and needed significant assistance just to stand.    Her daughter notes that over the last few days she was put on low-dose Lyrica by her pain specialist.  She stopped it after she developed some weakness and so she has not received  it in a couple days.  I suspect the Lyrica is probably driving the weakness despite it being a low dose due to her renal dysfunction.  Given she cannot walk I think she will need admission.  Discussed with Dr. Laural Benes.  I also discussed with nephrology, Dr. Marisue Humble.  He will see and coordinate dialysis.  States it is okay to go and give her 40 mill equivalents of potassium to help with her hypokalemia.       Final Clinical Impression(s) / ED Diagnoses Final diagnoses:  Weakness    Rx / DC Orders ED Discharge Orders     None         Pricilla Loveless, MD 02/27/23 1406

## 2023-02-27 NOTE — ED Notes (Signed)
Calling [phone] Dept to inform that pt is on the way after 4 unsuccessful attempts at handoff.

## 2023-02-27 NOTE — ED Notes (Signed)
Called [phone] dept to give report on pt. First time someone picked up the phone and I waited and said hello repeatedly for 1 min. 45 sec. The second time I called, no one answered. Will try again ina bit.

## 2023-02-27 NOTE — Progress Notes (Signed)
Brief nephrology progress note  Discussed with Dr. Gwenlyn Fudge in the ED. 57F E presentedSRD MWF earlier today with weakness and a fall.  This temporally might be related to the initiation of pregabalin. In the ED her potassium was 3.0, BUN 29.  Blood pressures are normal, she is on room air.  Workup includes 2 view chest x-ray with small pleural effusions bilaterally.  CT of the head without acute intracranial abnormalities but chronic severe small vessel disease present.  Patient to be admitted to the hospitalist service.  Will provide patient's routine hemodialysis based upon availability today or tomorrow.  Discussed with Arman Filter, hemodialysis RN.  No recent outpatient records available.  4K bath. Use AVF.  3.5h, 1-2L UF SBP > .  No heparin.

## 2023-02-27 NOTE — ED Triage Notes (Signed)
Pt reports she has been weak in her legs for about a week or longer. Dialysis MWF, last was Wednesday. Denies pain.

## 2023-02-27 NOTE — Hospital Course (Signed)
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  She reportedly has been feeling bad for about 1 week very fatigued and having difficulty ambulating.  Family called EMS because they were not able to get her up today.  EMS tried to get her up and she slumped to the side of the bed.  She needed assistance just to sit up.  She did not have her dialysis treatment today but she is normally very compliant with her treatments.  Patient denies any pain symptoms.  When trying to stand up her legs started trembling when she tried to take a step and has been unable to take another step.  Her labs look pretty stable on arrival with a potassium of 3.0.  Her SARS 2 coronavirus test was negative.  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  She has been too weakened to go home and admission requested for further evaluation and management.

## 2023-02-27 NOTE — ED Notes (Signed)
Pt had BM in pull-up. Changed pt diaper before going to CT.

## 2023-02-28 DIAGNOSIS — R531 Weakness: Secondary | ICD-10-CM | POA: Diagnosis not present

## 2023-02-28 DIAGNOSIS — Z8673 Personal history of transient ischemic attack (TIA), and cerebral infarction without residual deficits: Secondary | ICD-10-CM | POA: Diagnosis not present

## 2023-02-28 DIAGNOSIS — R4182 Altered mental status, unspecified: Secondary | ICD-10-CM | POA: Diagnosis present

## 2023-02-28 DIAGNOSIS — Z794 Long term (current) use of insulin: Secondary | ICD-10-CM

## 2023-02-28 DIAGNOSIS — N186 End stage renal disease: Secondary | ICD-10-CM | POA: Diagnosis not present

## 2023-02-28 DIAGNOSIS — I10 Essential (primary) hypertension: Secondary | ICD-10-CM

## 2023-02-28 DIAGNOSIS — E1165 Type 2 diabetes mellitus with hyperglycemia: Secondary | ICD-10-CM

## 2023-02-28 LAB — CBC
HCT: 30.1 % — ABNORMAL LOW (ref 36.0–46.0)
Hemoglobin: 10.3 g/dL — ABNORMAL LOW (ref 12.0–15.0)
MCH: 31.6 pg (ref 26.0–34.0)
MCHC: 34.2 g/dL (ref 30.0–36.0)
MCV: 92.3 fL (ref 80.0–100.0)
Platelets: 105 10*3/uL — ABNORMAL LOW (ref 150–400)
RBC: 3.26 MIL/uL — ABNORMAL LOW (ref 3.87–5.11)
RDW: 19.3 % — ABNORMAL HIGH (ref 11.5–15.5)
WBC: 6.6 10*3/uL (ref 4.0–10.5)
nRBC: 0.3 % — ABNORMAL HIGH (ref 0.0–0.2)

## 2023-02-28 LAB — RENAL FUNCTION PANEL
Albumin: 2.8 g/dL — ABNORMAL LOW (ref 3.5–5.0)
Anion gap: 12 (ref 5–15)
BUN: 35 mg/dL — ABNORMAL HIGH (ref 8–23)
CO2: 27 mmol/L (ref 22–32)
Calcium: 7.7 mg/dL — ABNORMAL LOW (ref 8.9–10.3)
Chloride: 99 mmol/L (ref 98–111)
Creatinine, Ser: 5.61 mg/dL — ABNORMAL HIGH (ref 0.44–1.00)
GFR, Estimated: 7 mL/min — ABNORMAL LOW (ref >60)
Glucose, Bld: 67 mg/dL — ABNORMAL LOW (ref 70–99)
Phosphorus: 3.6 mg/dL (ref 2.5–4.6)
Potassium: 3.9 mmol/L (ref 3.5–5.1)
Sodium: 138 mmol/L (ref 135–145)

## 2023-02-28 LAB — GLUCOSE, CAPILLARY
Glucose-Capillary: 103 mg/dL — ABNORMAL HIGH (ref 70–99)
Glucose-Capillary: 64 mg/dL — ABNORMAL LOW (ref 70–99)
Glucose-Capillary: 105 mg/dL — ABNORMAL HIGH (ref 70–99)
Glucose-Capillary: 94 mg/dL (ref 70–99)
Glucose-Capillary: 136 mg/dL — ABNORMAL HIGH (ref 70–99)

## 2023-02-28 LAB — MAGNESIUM: Magnesium: 2 mg/dL (ref 1.7–2.4)

## 2023-02-28 LAB — HEPATITIS B SURFACE ANTIBODY, QUANTITATIVE: Hep B S AB Quant (Post): 24.3 m[IU]/mL

## 2023-02-28 MED ORDER — INSULIN ASPART PROT & ASPART (70-30 MIX) 100 UNIT/ML ~~LOC~~ SUSP
8.0000 [IU] | Freq: Two times a day (BID) | SUBCUTANEOUS | Status: DC
  Administered 2023-02-28 – 2023-03-01 (×3): 8 [IU] via SUBCUTANEOUS
  Filled 2023-02-28: qty 10

## 2023-02-28 MED ORDER — PENTAFLUOROPROP-TETRAFLUOROETH EX AERO: 1.0000 | INHALATION_SPRAY | CUTANEOUS | Status: DC | PRN

## 2023-02-28 MED ORDER — LIDOCAINE-PRILOCAINE 2.5-2.5 % EX CREA: 1.0000 | TOPICAL_CREAM | CUTANEOUS | Status: DC | PRN

## 2023-02-28 MED ORDER — LIDOCAINE HCL (PF) 1 % IJ SOLN: 5.0000 mL | INTRAMUSCULAR | Status: DC | PRN

## 2023-02-28 NOTE — Progress Notes (Signed)
   HEMODIALYSIS TREATMENT NOTE:   Uneventful 3.5 hour heparin-free treatment completed using right upper arm AVF (15g/antegrade). UF was limited by soft blood pressures.  Net UF 1.3 liters.  All blood was returned.  Post-HD:  02/28/23 1945  Vitals  Temp 98 F (36.7 C)  Temp Source Oral  BP (!) 133/56  MAP (mmHg) 79  BP Location Left Arm  BP Method Automatic  Patient Position (if appropriate) Lying  Pulse Rate 89  Pulse Rate Source Monitor  ECG Heart Rate 88  Resp 15  Oxygen Therapy  SpO2 100 %  O2 Device Room Air  Post Treatment  Dialyzer Clearance Lightly streaked  Hemodialysis Intake (mL) 0 mL  Liters Processed 73.7  Fluid Removed (mL) 1300 mL  Tolerated HD Treatment Yes  AVG/AVF Arterial Site Held (minutes) 5 minutes  AVG/AVF Venous Site Held (minutes) 5 minutes  Fistula / Graft Right Upper arm  No placement date or time found.   Orientation: Right  Access Location: Upper arm  Fistula / Graft Assessment Thrill;Bruit  Status Patent    Arman Filter, RN AP K[facility]

## 2023-02-28 NOTE — Progress Notes (Signed)
Patient has complained of aching pain, and itching in the right foot. Prn medications given as well as rubbing patients foot down in lotions to help with pain management. Plan of care ongoing.

## 2023-02-28 NOTE — Progress Notes (Signed)
Pt transferred to HD in bed.

## 2023-02-28 NOTE — Care Management CC44 (Signed)
Condition Code 44 Documentation Completed  Patient Details  Name: [name] MRN: [id number] Date of Birth: 04/13/1941   Condition Code 44 given:  Yes Patient signature on Condition Code 44 notice:  Yes Documentation of 2 MD's agreement:  Yes Code 44 added to claim:  Yes    Villa Herb, LCSWA 02/28/2023, 3:52 PM

## 2023-02-28 NOTE — TOC CM/SW Note (Signed)
Transition of Care [facility]) - Inpatient Brief Assessment   Patient Details  Name: [name] MRN: [id number] Date of Birth: 1941-06-07  Transition of Care [facility] CM/SW Contact:    Villa Herb, LCSWA Phone Number: 02/28/2023, 12:44 PM   Clinical Narrative: [facility] consulted for HH/DME needs. PT eval is pending at this time. TOC to follow for recommendations and assist in D/C planning once PT eval has been completed. TOC to follow.   Transition of Care Asessment: Insurance and Status: Insurance coverage has been reviewed Patient has primary care physician: Yes Home environment has been reviewed: from home Prior level of function:: independent Prior/Current Home Services: No current home services Social Determinants of Health Reivew: SDOH reviewed no interventions necessary Readmission risk has been reviewed: Yes Transition of care needs: transition of care needs identified, TOC will continue to follow

## 2023-02-28 NOTE — Progress Notes (Signed)
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  She reportedly has been feeling bad for about 1 week very fatigued and having difficulty ambulating.  Family called EMS because they were not able to get her up today.  EMS tried to get her up and she slumped to the side of the bed.  She needed assistance just to sit up.  She did not have her dialysis treatment today but she is normally very compliant with her treatments.  Patient denies any pain symptoms.  When trying to stand up her legs started trembling when she tried to take a step and has been unable to take another step.  Her labs look pretty stable on arrival with a potassium of 3.0.  Her SARS 2 coronavirus test was negative.  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  She has been too weakened to go home and admission requested for further evaluation and management.     Assessment and Plan:  Generalized Weakness  - suspect mostly due to medication side effect as this occurred shortly after starting pregabalin - DC pregabalin - PT evaluation pending  - MRI with no finding of acute CVA - hemodialysis on schedule    Intention Tremor - likely a side effect of pregabalin - hopefully will resolve when  pregabalin washes out  - pregabalin is removed by dialysis due to its small molecular size  ESRD on HD - pt reports excellent compliance with HD treatments - nephrology consulted for inpatient HD  - resumed home medications    History of CVA with residual chronic right hemiparesis - CT head and MRI negative for acute CVA  - continue current medical management for secondary prevention    GERD  - resume PPI therapy for GI protection    Esophageal Dysphagia  - soft foods diet for better digestion   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 - stop pregabalin due to adverse side effects   Chronic constipation  - laxatives   DVT prophylaxis: SQ heparin  Code Status: Full  Family Communication: daughter at bedside 9/14     Consultants:  Nephrology  Procedures:  Hemodialysis 9/14 Antimicrobials:    Subjective: Reports developing an intentional tremor since starting lyrica  Objective: Vitals:   02/28/23 0445 02/28/23 1525 02/28/23 1543 02/28/23 1600  BP: 112/68 Marland Kitchen)  123/53 (!) 105/48 (!) 109/49  Pulse: 81 77    Resp: 15 18 15 16   Temp: 98 F (36.7 C) 98.1 F (36.7 C)    TempSrc: Oral Oral    SpO2: 100% 100%    Weight:      Height:        Intake/Output Summary (Last 24 hours) at 02/28/2023 1625 Last data filed at 02/28/2023 1200 Gross per 24 hour  Intake 960 ml  Output 0 ml  Net 960 ml   Filed Weights   02/27/23 1745  Weight: 74.2 kg   Examination:  General exam: still feels weak in legs and having an  intentional tremor.  Respiratory system: Clear to auscultation. Respiratory effort normal. Cardiovascular system: normal S1 & S2 heard. No JVD, murmurs, rubs, gallops or clicks. No pedal edema. Gastrointestinal system: Abdomen is nondistended, soft and nontender. No organomegaly or masses felt. Normal bowel sounds heard. Central nervous system: Alert and oriented. No focal neurological deficits. Extremities: Symmetric 5 x 5 power. Skin: No rashes, lesions or ulcers. Psychiatry: Judgement and insight appear normal. Mood & affect appropriate.   Data Reviewed: I have personally reviewed following labs and imaging studies  CBC: Recent Labs  Lab 02/27/23 0657 02/28/23 0500  WBC 8.2 6.6  HGB 11.0* 10.3*  HCT 33.1* 30.1*  MCV 94.3 92.3  PLT 100* 105*    Basic Metabolic Panel: Recent Labs  Lab 02/27/23 0657 02/28/23 0500  NA 138 138  K 3.0* 3.9  CL 94* 99  CO2 29 27  GLUCOSE 96 67*  BUN 29* 35*  CREATININE 4.82* 5.61*  CALCIUM 7.8* 7.7*  MG 1.8 2.0  PHOS  --  3.6    CBG: Recent Labs  Lab 02/27/23 2116 02/28/23 0244 02/28/23 0723 02/28/23 0754 02/28/23 1131  GLUCAP 208* 103* 64* 105* 94    Recent Results (from the past 240 hour(s))  SARS Coronavirus 2 by RT PCR (hospital order, performed in [facility] hospital lab) *cepheid single result test* Anterior Nasal Swab     Status: None   Collection Time: 02/27/23  7:36 AM   Specimen: Anterior Nasal Swab  Result Value Ref Range Status   SARS Coronavirus 2 by RT PCR NEGATIVE NEGATIVE Final    Comment: (NOTE) SARS-CoV-2 target nucleic acids are NOT DETECTED.  The SARS-CoV-2 RNA is generally detectable in upper and lower respiratory specimens during the acute phase of infection. The lowest concentration of SARS-CoV-2 viral copies this assay can detect is 250 copies / mL. A negative result does not preclude SARS-CoV-2 infection and should not be used as the sole basis for treatment or other patient management decisions.   A negative result may occur with improper specimen collection / handling, submission of specimen other than nasopharyngeal swab, presence of viral mutation(s) within the areas targeted by this assay, and inadequate number of viral copies (<250 copies / mL). A negative result must be combined with clinical observations, patient history, and epidemiological information.  Fact Sheet for Patients:   RoadLapTop.co.za  Fact Sheet for Healthcare Providers: http://kim-miller.com/  This test is not yet approved or  cleared by the [place] FDA and has been authorized for detection and/or diagnosis of SARS-CoV-2 by FDA under an Emergency Use Authorization (EUA).  This EUA will remain in effect (meaning this test can be used) for the duration of the COVID-19 declaration under Section 564(b)(1) of the Act, 21 U.S.C. section 360bbb-3(b)(1), unless the authorization is terminated or revoked sooner.  Performed at [facility]  [facility], [address] [place], [place] [address]   MRSA Next Gen by PCR, Nasal     Status: None   Collection Time: 02/27/23  4:56 PM   Specimen: Nasal Mucosa; Nasal Swab  Result Value Ref Range Status   MRSA by PCR Next Gen NOT DETECTED NOT DETECTED Final    Comment: (NOTE) The GeneXpert MRSA Assay (FDA approved for NASAL specimens only), is one component of a comprehensive MRSA colonization surveillance program. It is not intended to diagnose MRSA infection nor to guide or monitor treatment for MRSA infections. Test performance is not FDA approved in patients less than 59 years old. Performed at [facility], [address], [place], [place] [address]      Radiology Studies: MR BRAIN WO CONTRAST  Result Date: 02/27/2023 CLINICAL DATA:  Provided history: Neuro deficit, acute, stroke suspected. EXAM: MRI HEAD WITHOUT CONTRAST TECHNIQUE: Multiplanar, multiecho pulse sequences of the brain and surrounding structures were obtained  without intravenous contrast. COMPARISON:  Head CT 02/27/2023.  Brain MRI 01/10/2020. FINDINGS: Mild intermittent motion degradation. Brain: Generalized parenchymal atrophy. Redemonstrated small chronic cortical/subcortical infarct within the left parietal lobe. Advanced patchy and confluent T2 FLAIR hyperintense signal abnormality within the cerebral white matter, nonspecific but compatible with chronic small vessel ischemic disease. There are superimposed chronic white matter infarcts within the bilateral cerebral hemispheres, the majority of which were better appreciated on the prior brain MRI of 08/20/2013 (acute at that time). Moderate chronic small vessel ischemic changes within the pons. Multiple chronic infarcts within the cerebellum, the largest within the left hemisphere measuring 13 mm (series 10, image 6). There are a few punctate chronic microhemorrhages scattered within the supratentorial brain and cerebellum. There is no acute infarct. No evidence of an intracranial mass. No extra-axial fluid collection. No midline shift. Vascular: Maintained flow voids within the proximal large arterial vessels. Skull and upper cervical spine: No focal suspicious marrow lesion. Sinuses/Orbits: No mass or acute finding within the imaged orbits. Prior bilateral ocular lens replacement. No significant paranasal sinus disease. IMPRESSION: 1. Mildly motion degraded exam. 2. No evidence of an acute intracranial abnormality. 3. Redemonstrated small chronic cortical/subcortical infarct within the left parietal lobe. 4. Extensive chronic ischemic gliosis within the cerebral white matter, similar to the prior brain MRI of 01/10/2020. Moderate chronic small vessel ischemic changes within the pons, also similar to this prior exam. 5. Chronic infarcts within bilateral cerebral hemispheres, unchanged. 6. Generalized parenchymal atrophy. Electronically Signed   By: Jackey Loge D.O.   On: 02/27/2023 13:23   CT Head Wo  Contrast  Result Date: 02/27/2023 CLINICAL DATA:  Neuro deficit, acute, stroke suspected. Lower extremity weakness. EXAM: CT HEAD WITHOUT CONTRAST TECHNIQUE: Contiguous axial images were obtained from the base of the skull through the vertex without intravenous contrast. RADIATION DOSE REDUCTION: This exam was performed according to the departmental dose-optimization program which includes automated exposure control, adjustment of the mA and/or kV according to patient size and/or use of iterative reconstruction technique. COMPARISON:  Head CT 06/14/2022. FINDINGS: Brain: No acute hemorrhage. Unchanged severe chronic small-vessel disease with old infarct in the left superior parietal lobe. Cortical gray-white differentiation is otherwise preserved. Prominence of the ventricles and sulci within expected range for age. No hydrocephalus or extra-axial collection. No mass effect or midline shift. Vascular: No hyperdense vessel or unexpected calcification. Skull: No calvarial fracture or suspicious bone lesion. Skull base is unremarkable. Sinuses/Orbits: No acute finding. Other: None. IMPRESSION: 1. No acute intracranial abnormality. 2. Unchanged severe chronic small-vessel disease  with old infarct in the left superior parietal lobe. Electronically Signed   By: Orvan Falconer M.D.   On: 02/27/2023 08:34   DG Chest 2 View  Result Date: 02/27/2023 CLINICAL DATA:  Dyspnea. EXAM: CHEST - 2 VIEW COMPARISON:  July 04, 2022. FINDINGS: Stable cardiomegaly. Small bilateral pleural effusions are noted with associated bibasilar subsegmental atelectasis. Bony thorax is unremarkable. IMPRESSION: Small pleural effusions with associated bibasilar subsegmental atelectasis. Electronically Signed   By: Lupita Raider M.D.   On: 02/27/2023 08:28    Scheduled Meds:  aspirin EC  81 mg Oral Daily   heparin  5,000 Units Subcutaneous Q8H   insulin aspart  0-6 Units Subcutaneous TID WC   insulin aspart protamine- aspart  8 Units  Subcutaneous BID WC   labetalol  200 mg Oral BID   multivitamin  1 tablet Oral QHS   pantoprazole  40 mg Oral BID   Continuous Infusions:   LOS: 1 day   Time spent: 50 mins  Mesha Schamberger Laural Benes, MD How to contact the [facility] Attending or Consulting provider 7A - 7P or covering provider during after hours 7P -7A, for this patient?  Check the care team in [facility] and look for a) attending/consulting [facility] provider listed and b) the [facility] team listed Log into [email or web address] and use [place]'s universal password to access. If you do not have the password, please contact the hospital operator. Locate the [facility] provider you are looking for under [facility] and page to a number that you can be directly reached. If you still have difficulty reaching the provider, please page the [facility] (Director on Call) for the Hospitalists listed on amion for assistance.  02/28/2023, 4:25 PM

## 2023-02-28 NOTE — Care Management Obs Status (Signed)
MEDICARE OBSERVATION STATUS NOTIFICATION   Patient Details  Name: [name] MRN: [id number] Date of Birth: 01-12-1941   Medicare Observation Status Notification Given:  Yes    Villa Herb, LCSWA 02/28/2023, 3:52 PM

## 2023-03-01 DIAGNOSIS — R531 Weakness: Secondary | ICD-10-CM | POA: Diagnosis not present

## 2023-03-01 DIAGNOSIS — Z8673 Personal history of transient ischemic attack (TIA), and cerebral infarction without residual deficits: Secondary | ICD-10-CM | POA: Diagnosis not present

## 2023-03-01 DIAGNOSIS — I10 Essential (primary) hypertension: Secondary | ICD-10-CM | POA: Diagnosis not present

## 2023-03-01 DIAGNOSIS — N186 End stage renal disease: Secondary | ICD-10-CM | POA: Diagnosis not present

## 2023-03-01 LAB — GLUCOSE, CAPILLARY
Glucose-Capillary: 144 mg/dL — ABNORMAL HIGH (ref 70–99)
Glucose-Capillary: 146 mg/dL — ABNORMAL HIGH (ref 70–99)
Glucose-Capillary: 260 mg/dL — ABNORMAL HIGH (ref 70–99)
Glucose-Capillary: 292 mg/dL — ABNORMAL HIGH (ref 70–99)
Glucose-Capillary: 110 mg/dL — ABNORMAL HIGH (ref 70–99)

## 2023-03-01 LAB — RENAL FUNCTION PANEL
Albumin: 2.8 g/dL — ABNORMAL LOW (ref 3.5–5.0)
Anion gap: 12 (ref 5–15)
BUN: 23 mg/dL (ref 8–23)
CO2: 26 mmol/L (ref 22–32)
Calcium: 7.5 mg/dL — ABNORMAL LOW (ref 8.9–10.3)
Chloride: 94 mmol/L — ABNORMAL LOW (ref 98–111)
Creatinine, Ser: 3.99 mg/dL — ABNORMAL HIGH (ref 0.44–1.00)
GFR, Estimated: 11 mL/min — ABNORMAL LOW (ref >60)
Glucose, Bld: 134 mg/dL — ABNORMAL HIGH (ref 70–99)
Phosphorus: 2.4 mg/dL — ABNORMAL LOW (ref 2.5–4.6)
Potassium: 4.1 mmol/L (ref 3.5–5.1)
Sodium: 132 mmol/L — ABNORMAL LOW (ref 135–145)

## 2023-03-01 LAB — CBC
HCT: 31.2 % — ABNORMAL LOW (ref 36.0–46.0)
Hemoglobin: 10.4 g/dL — ABNORMAL LOW (ref 12.0–15.0)
MCH: 31.1 pg (ref 26.0–34.0)
MCHC: 33.3 g/dL (ref 30.0–36.0)
MCV: 93.4 fL (ref 80.0–100.0)
Platelets: 91 10*3/uL — ABNORMAL LOW (ref 150–400)
RBC: 3.34 MIL/uL — ABNORMAL LOW (ref 3.87–5.11)
RDW: 19.1 % — ABNORMAL HIGH (ref 11.5–15.5)
WBC: 7.5 10*3/uL (ref 4.0–10.5)
nRBC: 0 % (ref 0.0–0.2)

## 2023-03-01 NOTE — Evaluation (Signed)
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  She reportedly has been feeling bad for about 1 week very fatigued and having difficulty ambulating.  Family called EMS because they were not able to get her up today.  EMS tried to get her up and she slumped to the side of the bed.  She needed assistance just to sit up.  She did not have her dialysis treatment today but she is normally very compliant with her treatments.  Patient denies any pain symptoms.  When trying to stand up her legs started trembling when she tried to take a step and has been unable to take another step.  Her labs look pretty stable on arrival with a potassium of 3.0.  Her SARS 2 coronavirus test was negative.  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  She has been too weakened to go home and admission requested for further evaluation and management.    Clinical Impression  On therapist arrival; patient is sitting up on the edge of the bed eating her breakfast; she periodically has to lie back on pillows due to her back "feeling weak".  Patient is agreeable to therapist assessment.  Patient needs min assist to come fully to edge of the bed to put her feet on the floor.  She is able to perform sit to stand to RW with min assist from  therapist due to leg weakness to boost up to standing.  She stands with flexed trunk with RW but improves posturing with verbal cues and min A from therapist. After a brief stand; patient reports her legs are weak and she needs to return to sitting.  Patient then needs min A for her legs for sit to supine and to scoot up in bed.  patient left in bed with call button in reach, bed alarm set and nursing notified of mobility status.  Patient will benefit from continued skilled therapy services during the remainder of her hospital stay and at the next recommended venue of care to address deficits and promote return to optimal function.             If plan is discharge home, recommend the following: A little help with walking and/or transfers;A little help with bathing/dressing/bathroom;Help with stairs or ramp for entrance;Assistance with cooking/housework   Can travel by private vehicle        Equipment Recommendations None recommended by PT  Recommendations for Other Services       Functional Status Assessment Patient has had a recent decline in their functional status and demonstrates the ability to make significant improvements in function in a reasonable and predictable amount of time.     Precautions / Restrictions Precautions Precautions: Fall Restrictions Weight Bearing Restrictions: No      Mobility  Bed Mobility Overal bed mobility: Needs Assistance Bed Mobility: Sit to Supine       Sit to  supine: Min assist   General bed mobility comments: needs assist for legs and to scoot up in bed Patient Response: Cooperative  Transfers Overall transfer level: Needs assistance Equipment used: Rolling walker (2 wheels) Transfers: Sit to/from Stand Sit to Stand: Min assist           General transfer comment: needs assist for initial boost up to standing and then to steady herself in standing    Ambulation/Gait               General Gait Details: did not attempt  gait as patient reports her legs felt weak and needed to sit back down  Stairs            Wheelchair Mobility     Tilt Bed Tilt Bed Patient Response: Cooperative  Modified Rankin (Stroke Patients Only)       Balance Overall balance assessment: Needs assistance Sitting-balance support: Bilateral upper extremity supported, Feet supported Sitting balance-Leahy Scale: Fair Sitting balance - Comments: fair sitting balance on the edge of the bed; patient fatigues and has to lie back on pillows for support Postural control: Posterior lean Standing balance support: Bilateral upper extremity supported, During functional activity, Reliant on assistive device for balance Standing balance-Leahy Scale: Fair Standing balance comment: fair standing balance with RW and min A from therapist for balance; needs cues to improve posturing; patient reports legs feel weak and needs to sit back down                             Pertinent Vitals/Pain Pain Assessment Pain Assessment: No/denies pain Pain Location: reports her back usually hurts later in the day Pain Intervention(s): Monitored during session    Home Living Family/patient expects to be discharged to:: Private residence Living Arrangements: Children Available Help at Discharge: Available 24 hours/day;Family Type of Home: House Home Access: Stairs to enter Entrance Stairs-Rails: Can reach both;Right;Left Entrance Stairs-Number of Steps: 3   Home Layout: One level Home Equipment: Shower seat;Wheelchair - Forensic psychologist (2 wheels);Rollator (4 wheels);BSC/3in1;Grab bars - tub/shower;Grab bars - toilet Additional Comments: patient states her 2 sons live with her and her daughter stays with her at night while the sons work    Prior Function Prior Level of Function : Needs assist             Mobility Comments: patient stating household ambulation with RW ADLs Comments: Pt is assisted to wash her back when  bathing. Set up assist for dressing. Pt reports ablity to toilet, groom, and feed without assist. Family assist with IADL's.     Extremity/Trunk Assessment   Upper Extremity Assessment Upper Extremity Assessment: Generalized weakness;RUE deficits/detail RUE Deficits / Details: R upper arm fistula for HD    Lower Extremity Assessment Lower Extremity Assessment: Generalized weakness    Cervical / Trunk Assessment Cervical / Trunk Assessment: Kyphotic  Communication      Cognition Arousal: Alert Behavior During Therapy: WFL for tasks assessed/performed Overall Cognitive Status: Within Functional Limits for tasks assessed                                 General Comments: patient occasionally needs questions repeated        General Comments      Exercises     Assessment/Plan    PT Assessment Patient needs continued PT services  PT Problem List Decreased strength;Decreased activity  tolerance;Decreased balance;Decreased mobility       PT Treatment Interventions Functional mobility training;Therapeutic activities;Therapeutic exercise;Stair training;Gait training;Balance training;Patient/family education    PT Goals (Current goals can be found in the Care Plan section)  Acute Rehab PT Goals Patient Stated Goal: return home PT Goal Formulation: With patient Time For Goal Achievement: 03/15/23 Potential to Achieve Goals: Good    Frequency Min 2X/week     Co-evaluation               AM-PAC PT "6 Clicks" Mobility  Outcome Measure Help needed turning from your back to your side while in a flat bed without using bedrails?: A Little Help needed moving from lying on your back to sitting on the side of a flat bed without using bedrails?: A Little Help needed moving to and from a bed to a chair (including a wheelchair)?: A Little Help needed standing up from a chair using your arms (e.g., wheelchair or bedside chair)?: A Little Help needed to walk in  hospital room?: A Lot Help needed climbing 3-5 steps with a railing? : A Lot 6 Click Score: 16    End of Session   Activity Tolerance: Patient tolerated treatment well Patient left: in bed;with call bell/phone within reach;with bed alarm set Nurse Communication: Mobility status PT Visit Diagnosis: Unsteadiness on feet (R26.81);Muscle weakness (generalized) (M62.81);Other abnormalities of gait and mobility (R26.89)    Time: 1610-9604 PT Time Calculation (min) (ACUTE ONLY): 19 min   Charges:   PT Evaluation $PT Eval Low Complexity: 1 Low   PT General Charges $$ ACUTE PT VISIT: 1 Visit         9:29 AM, 03/01/23 Tavaughn Silguero Small Jeannett Dekoning MPT [place] [facility] [place] [phone] Ph:[phone]

## 2023-03-01 NOTE — Progress Notes (Signed)
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  She reportedly has been feeling bad for about 1 week very fatigued and having difficulty ambulating.  Family called EMS because they were not able to get her up today.  EMS tried to get her up and she slumped to the side of the bed.  She needed assistance just to sit up.  She did not have her dialysis treatment today but she is normally very compliant with her treatments.  Patient denies any pain symptoms.  When trying to stand up her legs started trembling when she tried to take a step and has been unable to take another step.  Her labs look pretty stable on arrival with a potassium of 3.0.  Her SARS 2 coronavirus test was negative.  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  She has been too weakened to go home and admission requested for further evaluation and management.     Assessment and Plan:  Generalized Weakness  - suspect mostly due to medication side effect as this occurred shortly after starting pregabalin - DC pregabalin - PT evaluation pending  - MRI with no finding of acute CVA - hemodialysis on schedule   Gait Instability - reporting inability to ambulate - awaiting PT eval regarding ambulating -  follow up PT recommendations   Intention Tremor - improving - likely a side effect of pregabalin - hopefully will resolve when pregabalin washes out  - pregabalin is removed by dialysis due to its small molecular size  ESRD on HD (MWF) - pt reports excellent compliance with HD treatments - nephrology consulted for inpatient HD  - resumed home medications    History of CVA with residual chronic right hemiparesis - CT head and MRI negative for acute CVA  - continue current medical management for secondary prevention    GERD  - resume PPI therapy for GI protection    Esophageal Dysphagia  - soft foods diet for better digestion   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 - stop pregabalin due to adverse side effects   Chronic constipation  - laxatives   DVT prophylaxis: SQ heparin  Code Status: Full  Family Communication: daughter at bedside 9/14     Consultants:  Nephrology   Procedures:  Hemodialysis 9/14 Antimicrobials:    Subjective: Reports  developing an intentional tremor since starting lyrica  Objective: Vitals:   02/28/23 1918 02/28/23 1945 02/28/23 2148 03/01/23 0510  BP: 128/60 (!) 133/56 (!) 117/46 (!) 121/54  Pulse:  89 81 82  Resp: 16 15 20 20   Temp:  98 F (36.7 C) 98.4 F (36.9 C) 98.2 F (36.8 C)  TempSrc:  Oral Oral Oral  SpO2:  100% 100% 98%  Weight:      Height:        Intake/Output Summary (Last 24 hours) at 03/01/2023 1138 Last data filed at 02/28/2023 1945 Gross per 24 hour   Intake 480 ml  Output 1300 ml  Net -820 ml   Filed Weights   02/27/23 1745  Weight: 74.2 kg   Examination:  General exam: still feels weak in legs and having an intentional tremor.  Respiratory system: Clear to auscultation. Respiratory effort normal. Cardiovascular system: normal S1 & S2 heard. No JVD, murmurs, rubs, gallops or clicks. No pedal edema. Gastrointestinal system: Abdomen is nondistended, soft and nontender. No organomegaly or masses felt. Normal bowel sounds heard. Central nervous system: Alert and oriented. No focal neurological deficits. Extremities: Symmetric 5 x 5 power. Skin: No rashes, lesions or ulcers. Psychiatry: Judgement and insight appear normal. Mood & affect appropriate.   Data Reviewed: I have personally reviewed following labs and imaging studies  CBC: Recent Labs  Lab 02/27/23 0657 02/28/23 0500 03/01/23 0520  WBC 8.2 6.6 7.5  HGB 11.0* 10.3* 10.4*  HCT 33.1* 30.1* 31.2*  MCV 94.3 92.3 93.4  PLT 100* 105* 91*    Basic Metabolic Panel: Recent Labs  Lab 02/27/23 0657 02/28/23 0500 03/01/23 0520  NA 138 138 132*  K 3.0* 3.9 4.1  CL 94* 99 94*  CO2 29 27 26   GLUCOSE 96 67* 134*  BUN 29* 35* 23  CREATININE 4.82* 5.61* 3.99*  CALCIUM 7.8* 7.7* 7.5*  MG 1.8 2.0  --   PHOS  --  3.6 2.4*    CBG: Recent Labs  Lab 02/28/23 0754 02/28/23 1131 02/28/23 2152 03/01/23 0349 03/01/23 0745  GLUCAP 105* 94 136* 144* 146*    Recent Results (from the past 240 hour(s))  SARS Coronavirus 2 by RT PCR (hospital order, performed in [facility] hospital lab) *cepheid single result test* Anterior Nasal Swab     Status: None   Collection Time: 02/27/23  7:36 AM   Specimen: Anterior Nasal Swab  Result Value Ref Range Status   SARS Coronavirus 2 by RT PCR NEGATIVE NEGATIVE Final    Comment: (NOTE) SARS-CoV-2 target nucleic acids are NOT DETECTED.  The SARS-CoV-2 RNA is generally detectable in upper and lower respiratory specimens during the  acute phase of infection. The lowest concentration of SARS-CoV-2 viral copies this assay can detect is 250 copies / mL. A negative result does not preclude SARS-CoV-2 infection and should not be used as the sole basis for treatment or other patient management decisions.  A negative result may occur with improper specimen collection / handling, submission of specimen other than nasopharyngeal swab, presence of viral mutation(s) within the areas targeted by this assay, and inadequate number of viral copies (<250 copies / mL). A negative result must be combined with clinical observations, patient history, and epidemiological information.  Fact Sheet for Patients:   RoadLapTop.co.za  Fact Sheet for Healthcare Providers: http://kim-miller.com/  This test is not yet approved or  cleared by the [place] FDA and has been authorized for detection and/or diagnosis of SARS-CoV-2 by FDA under  an Emergency Use Authorization (EUA).  This EUA will remain in effect (meaning this test can be used) for the duration of the COVID-19 declaration under Section 564(b)(1) of the Act, 21 U.S.C. section 360bbb-3(b)(1), unless the authorization is terminated or revoked sooner.  Performed at [facility], [address] [place], [place] [address]   MRSA Next Gen by PCR, Nasal     Status: None   Collection Time: 02/27/23  4:56 PM   Specimen: Nasal Mucosa; Nasal Swab  Result Value Ref Range Status   MRSA by PCR Next Gen NOT DETECTED NOT DETECTED Final    Comment: (NOTE) The GeneXpert MRSA Assay (FDA approved for NASAL specimens only), is one component of a comprehensive MRSA colonization surveillance program. It is not intended to diagnose MRSA infection nor to guide or monitor treatment for MRSA infections. Test performance is not FDA approved in patients less than 43 years old. Performed at [facility], [address], [place], [place] [address]       Radiology Studies: No results found.  Scheduled Meds:  aspirin EC  81 mg Oral Daily   heparin  5,000 Units Subcutaneous Q8H   insulin aspart  0-6 Units Subcutaneous TID WC   insulin aspart protamine- aspart  8 Units Subcutaneous BID WC   labetalol  200 mg Oral BID   multivitamin  1 tablet Oral QHS   pantoprazole  40 mg Oral BID   Continuous Infusions:   LOS: 1 day   Time spent: 50 mins  Dom Haverland Laural Benes, MD How to contact the [facility] Attending or Consulting provider 7A - 7P or covering provider during after hours 7P -7A, for this patient?  Check the care team in [facility] and look for a) attending/consulting TRH provider listed and b) the [facility] team listed Log into [email or web address] and use [place]'s universal password to access. If you do not have the password, please contact the hospital operator. Locate the [facility] provider you are looking for under [facility] and page to a number that you can be directly reached. If you still have difficulty reaching the provider, please page the [facility] (Director on Call) for the Hospitalists listed on amion for assistance.  03/01/2023, 11:38 AM

## 2023-03-01 NOTE — Plan of Care (Signed)

## 2023-03-01 NOTE — Plan of Care (Signed)
  Problem: Acute Rehab PT Goals(only PT should resolve) Goal: Pt Will Go Supine/Side To Sit Outcome: Progressing Flowsheets (Taken 03/01/2023 0929) Pt will go Supine/Side to Sit: with contact guard assist Goal: Patient Will Transfer Sit To/From Stand Outcome: Progressing Flowsheets (Taken 03/01/2023 0929) Patient will transfer sit to/from stand: with contact guard assist Goal: Pt Will Transfer Bed To Chair/Chair To Bed Outcome: Progressing Flowsheets (Taken 03/01/2023 0929) Pt will Transfer Bed to Chair/Chair to Bed: with contact guard assist Goal: Pt Will Ambulate Outcome: Progressing Flowsheets (Taken 03/01/2023 0929) Pt will Ambulate:  25 feet  with rolling walker  with contact guard assist

## 2023-03-02 DIAGNOSIS — G253 Myoclonus: Secondary | ICD-10-CM | POA: Diagnosis present

## 2023-03-02 DIAGNOSIS — N186 End stage renal disease: Secondary | ICD-10-CM | POA: Diagnosis not present

## 2023-03-02 DIAGNOSIS — Z7984 Long term (current) use of oral hypoglycemic drugs: Secondary | ICD-10-CM | POA: Diagnosis not present

## 2023-03-02 DIAGNOSIS — R0602 Shortness of breath: Secondary | ICD-10-CM | POA: Diagnosis not present

## 2023-03-02 DIAGNOSIS — E1165 Type 2 diabetes mellitus with hyperglycemia: Secondary | ICD-10-CM | POA: Diagnosis not present

## 2023-03-02 DIAGNOSIS — N250 Renal osteodystrophy: Secondary | ICD-10-CM | POA: Diagnosis not present

## 2023-03-02 DIAGNOSIS — Z1152 Encounter for screening for COVID-19: Secondary | ICD-10-CM | POA: Diagnosis not present

## 2023-03-02 DIAGNOSIS — I69351 Hemiplegia and hemiparesis following cerebral infarction affecting right dominant side: Secondary | ICD-10-CM | POA: Diagnosis not present

## 2023-03-02 DIAGNOSIS — K5909 Other constipation: Secondary | ICD-10-CM | POA: Diagnosis present

## 2023-03-02 DIAGNOSIS — I10 Essential (primary) hypertension: Secondary | ICD-10-CM | POA: Diagnosis not present

## 2023-03-02 DIAGNOSIS — Z992 Dependence on renal dialysis: Secondary | ICD-10-CM | POA: Diagnosis not present

## 2023-03-02 DIAGNOSIS — G9341 Metabolic encephalopathy: Secondary | ICD-10-CM | POA: Diagnosis present

## 2023-03-02 DIAGNOSIS — J90 Pleural effusion, not elsewhere classified: Secondary | ICD-10-CM | POA: Diagnosis present

## 2023-03-02 DIAGNOSIS — E1142 Type 2 diabetes mellitus with diabetic polyneuropathy: Secondary | ICD-10-CM | POA: Diagnosis present

## 2023-03-02 DIAGNOSIS — K219 Gastro-esophageal reflux disease without esophagitis: Secondary | ICD-10-CM | POA: Diagnosis present

## 2023-03-02 DIAGNOSIS — E1151 Type 2 diabetes mellitus with diabetic peripheral angiopathy without gangrene: Secondary | ICD-10-CM | POA: Diagnosis present

## 2023-03-02 DIAGNOSIS — E1122 Type 2 diabetes mellitus with diabetic chronic kidney disease: Secondary | ICD-10-CM | POA: Diagnosis present

## 2023-03-02 DIAGNOSIS — R531 Weakness: Secondary | ICD-10-CM | POA: Diagnosis not present

## 2023-03-02 DIAGNOSIS — R1314 Dysphagia, pharyngoesophageal phase: Secondary | ICD-10-CM | POA: Diagnosis present

## 2023-03-02 DIAGNOSIS — N2581 Secondary hyperparathyroidism of renal origin: Secondary | ICD-10-CM | POA: Diagnosis present

## 2023-03-02 DIAGNOSIS — E1143 Type 2 diabetes mellitus with diabetic autonomic (poly)neuropathy: Secondary | ICD-10-CM | POA: Diagnosis present

## 2023-03-02 DIAGNOSIS — T383X5A Adverse effect of insulin and oral hypoglycemic [antidiabetic] drugs, initial encounter: Secondary | ICD-10-CM | POA: Diagnosis present

## 2023-03-02 DIAGNOSIS — E162 Hypoglycemia, unspecified: Secondary | ICD-10-CM | POA: Diagnosis not present

## 2023-03-02 DIAGNOSIS — D631 Anemia in chronic kidney disease: Secondary | ICD-10-CM | POA: Diagnosis not present

## 2023-03-02 DIAGNOSIS — Z8673 Personal history of transient ischemic attack (TIA), and cerebral infarction without residual deficits: Secondary | ICD-10-CM | POA: Diagnosis not present

## 2023-03-02 DIAGNOSIS — E785 Hyperlipidemia, unspecified: Secondary | ICD-10-CM | POA: Diagnosis present

## 2023-03-02 DIAGNOSIS — E11649 Type 2 diabetes mellitus with hypoglycemia without coma: Secondary | ICD-10-CM | POA: Diagnosis present

## 2023-03-02 DIAGNOSIS — I120 Hypertensive chronic kidney disease with stage 5 chronic kidney disease or end stage renal disease: Secondary | ICD-10-CM | POA: Diagnosis not present

## 2023-03-02 DIAGNOSIS — G252 Other specified forms of tremor: Secondary | ICD-10-CM | POA: Diagnosis present

## 2023-03-02 DIAGNOSIS — Z794 Long term (current) use of insulin: Secondary | ICD-10-CM | POA: Diagnosis not present

## 2023-03-02 LAB — GLUCOSE, CAPILLARY
Glucose-Capillary: 34 mg/dL — CL (ref 70–99)
Glucose-Capillary: 136 mg/dL — ABNORMAL HIGH (ref 70–99)
Glucose-Capillary: 76 mg/dL (ref 70–99)
Glucose-Capillary: 102 mg/dL — ABNORMAL HIGH (ref 70–99)
Glucose-Capillary: 137 mg/dL — ABNORMAL HIGH (ref 70–99)
Glucose-Capillary: 184 mg/dL — ABNORMAL HIGH (ref 70–99)
Glucose-Capillary: 302 mg/dL — ABNORMAL HIGH (ref 70–99)

## 2023-03-02 LAB — RENAL FUNCTION PANEL
Albumin: 2.8 g/dL — ABNORMAL LOW (ref 3.5–5.0)
Anion gap: 12 (ref 5–15)
BUN: 31 mg/dL — ABNORMAL HIGH (ref 8–23)
CO2: 30 mmol/L (ref 22–32)
Calcium: 7.8 mg/dL — ABNORMAL LOW (ref 8.9–10.3)
Chloride: 95 mmol/L — ABNORMAL LOW (ref 98–111)
Creatinine, Ser: 5.2 mg/dL — ABNORMAL HIGH (ref 0.44–1.00)
GFR, Estimated: 8 mL/min — ABNORMAL LOW (ref >60)
Glucose, Bld: 62 mg/dL — ABNORMAL LOW (ref 70–99)
Phosphorus: 3 mg/dL (ref 2.5–4.6)
Potassium: 3.8 mmol/L (ref 3.5–5.1)
Sodium: 137 mmol/L (ref 135–145)

## 2023-03-02 MED ORDER — DIPHENHYDRAMINE HCL 25 MG PO CAPS
25.0000 mg | ORAL_CAPSULE | Freq: Four times a day (QID) | ORAL | Status: DC | PRN
  Administered 2023-03-02 (×2): 25 mg via ORAL
  Filled 2023-03-02 (×2): qty 1

## 2023-03-02 MED ORDER — DEXTROSE 50 % IV SOLN
INTRAVENOUS | Status: AC
  Filled 2023-03-02: qty 50

## 2023-03-02 MED ORDER — DEXTROSE 50 % IV SOLN
25.0000 g | INTRAVENOUS | Status: AC
  Administered 2023-03-02: 25 g via INTRAVENOUS

## 2023-03-02 NOTE — Plan of Care (Signed)
  Problem: Nutritional: Goal: Maintenance of adequate nutrition will improve Outcome: Not Progressing   Problem: Skin Integrity: Goal: Risk for impaired skin integrity will decrease Outcome: Not Progressing   Problem: Metabolic: Goal: Ability to maintain appropriate glucose levels will improve Outcome: Not Progressing   Problem: Safety: Goal: Ability to remain free from injury will improve Outcome: Not Progressing   Problem: Pain Managment: Goal: General experience of comfort will improve Outcome: Not Progressing

## 2023-03-02 NOTE — Progress Notes (Signed)
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  She reportedly has been feeling bad for about 1 week very fatigued and having difficulty ambulating.  Family called EMS because they were not able to get her up today.  EMS tried to get her up and she slumped to the side of the bed.  She needed assistance just to sit up.  She did not have her dialysis treatment today but she is normally very compliant with her treatments.  Patient denies any pain symptoms.  When trying to stand up her legs started trembling when she tried to take a step and has been unable to take another step.  Her labs look pretty stable on arrival with a potassium of 3.0.  Her SARS 2 coronavirus test was negative.  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  She has been too weakened to go home and admission requested for further evaluation and management.     Assessment and Plan:  Generalized Weakness  - suspect mostly due to medication side effect as this occurred shortly after starting pregabalin - DC pregabalin - PT evaluation pending  - MRI with no finding of acute CVA - hemodialysis on schedule   Gait Instability - reporting inability to ambulate - awaiting PT eval regarding ambulating -  follow up PT recommendations   Intention Tremor and myoclonic jerking - likely a side effect of pregabalin - hopefully will resolve when pregabalin washes out  - pregabalin is removed by dialysis due to its small molecular size - if persists may need to refer her out to neurologist for outpatient evaluation  ESRD on HD (MWF) - pt reports excellent compliance with HD treatments - nephrology consulted for inpatient HD  - resumed home medications    History of CVA with residual chronic right hemiparesis - CT head and MRI negative for acute CVA  - continue current medical management for secondary prevention    GERD  - resume PPI therapy for GI protection    Esophageal Dysphagia  - soft foods diet for better digestion   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 Fransico Him  - continue 70/30 insulin reduced to 8 units BID - monitor CBG closely while in hospital  CBG (last 3)  Recent Labs    03/02/23 0608 03/02/23 0737 03/02/23 1121  GLUCAP 76 102* 137*   Hypoglycemia from insulin - pt had very low BS overnight - daughter says that this has been happening at home as well - DC 70/30 insulin  - continue very sensitive renal SSI coverage for now - would not restart 70/30 insulin at discharge  - she could probably benefit from tradjenta 5 mg daily  - at her advanced age, we want to do everything we can to avoid any severe hypoglycemia episodes  Anemia in CKD  - Hg stable from recent testing   Hyperlipidemia  - resumed home lipid lowering medication   Pleural Effusion  - chronic and asymptomatic, monitor - volume management with hemodialysis    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 - stop pregabalin due to adverse side effects   Chronic constipation  - laxatives   DVT prophylaxis: SQ heparin  Code Status: Full  Family Communication: daughter at bedside 9/14, 9/16      Consultants:  Nephrology   Procedures:   Hemodialysis 9/14 Antimicrobials:    Subjective: Still having jerking movements and had low blood sugar last night   Objective: Vitals:   03/02/23 1230 03/02/23 1300 03/02/23 1311 03/02/23 1332  BP: (!) 138/56 128/60 132/60   Pulse: 89 90 92   Resp: 17 18 18    Temp:   98.2 F (36.8 C)   TempSrc:   Oral   SpO2:      Weight:    72.5 kg  Height:        Intake/Output Summary (Last 24 hours) at 03/02/2023 1407 Last data filed at 03/02/2023 1311 Gross per 24 hour  Intake 150 ml  Output 2500 ml  Net -2350 ml   Filed Weights   02/27/23 1745 03/02/23 0945 03/02/23 1332  Weight: 74.2 kg 75 kg 72.5 kg   Examination:  General exam: still feels weak in legs and having an intentional tremor.  Respiratory system: Clear to auscultation. Respiratory effort normal. Cardiovascular system: normal S1 & S2 heard. No JVD, murmurs, rubs, gallops or clicks. No pedal edema. Gastrointestinal system: Abdomen is nondistended, soft and nontender. No organomegaly or masses felt. Normal bowel sounds heard. Central nervous system: Alert and oriented. No focal neurological deficits. Extremities: Symmetric 5 x 5 power. Skin: No rashes, lesions or ulcers. Psychiatry: Judgement and insight appear normal. Mood & affect appropriate.   Data Reviewed: I have personally reviewed following labs and imaging studies  CBC: Recent Labs  Lab 02/27/23 0657 02/28/23 0500 03/01/23 0520  WBC 8.2 6.6 7.5  HGB 11.0* 10.3* 10.4*  HCT 33.1* 30.1* 31.2*  MCV 94.3 92.3 93.4  PLT 100* 105* 91*    Basic Metabolic Panel: Recent Labs  Lab 02/27/23 0657 02/28/23 0500 03/01/23 0520 03/02/23 0507  NA 138 138 132* 137  K 3.0* 3.9 4.1 3.8  CL 94* 99 94* 95*  CO2 29 27 26 30   GLUCOSE 96 67* 134* 62*  BUN 29* 35* 23 31*  CREATININE 4.82* 5.61* 3.99* 5.20*  CALCIUM 7.8* 7.7* 7.5* 7.8*  MG 1.8 2.0  --   --   PHOS  --  3.6 2.4* 3.0    CBG: Recent Labs  Lab 03/02/23 0319 03/02/23 0342 03/02/23 0608  03/02/23 0737 03/02/23 1121  GLUCAP 34* 136* 76 102* 137*    Recent Results (from the past 240 hour(s))  SARS Coronavirus 2 by RT PCR (hospital order, performed in [facility] hospital lab) *cepheid single result test* Anterior Nasal Swab     Status: None   Collection Time: 02/27/23  7:36 AM   Specimen: Anterior Nasal Swab  Result Value Ref Range Status   SARS Coronavirus 2 by RT PCR NEGATIVE NEGATIVE Final    Comment: (NOTE) SARS-CoV-2 target nucleic acids are NOT DETECTED.  The SARS-CoV-2 RNA is generally detectable in upper and lower respiratory specimens during the acute phase of infection. The lowest concentration of SARS-CoV-2 viral copies this assay can detect is 250 copies / mL. A negative result does not preclude SARS-CoV-2  infection and should not be used as the sole basis for treatment or other patient management decisions.  A negative result may occur with improper specimen collection / handling, submission of specimen other than nasopharyngeal swab, presence of viral mutation(s) within the areas targeted by this assay, and inadequate number of viral copies (<250 copies / mL). A negative result must be combined with clinical observations, patient history, and epidemiological information.  Fact Sheet for Patients:   RoadLapTop.co.za  Fact Sheet for Healthcare Providers: http://kim-miller.com/  This test is not yet approved or  cleared by the [place] FDA and has been authorized for detection and/or diagnosis of SARS-CoV-2 by FDA under an Emergency Use Authorization (EUA).  This EUA will remain in effect (meaning this test can be used) for the duration of the COVID-19 declaration under Section 564(b)(1) of the Act, 21 U.S.C. section 360bbb-3(b)(1), unless the authorization is terminated or revoked sooner.  Performed at [facility], [address] [place], [place] [address]   MRSA Next Gen by PCR, Nasal     Status:  None   Collection Time: 02/27/23  4:56 PM   Specimen: Nasal Mucosa; Nasal Swab  Result Value Ref Range Status   MRSA by PCR Next Gen NOT DETECTED NOT DETECTED Final    Comment: (NOTE) The GeneXpert MRSA Assay (FDA approved for NASAL specimens only), is one component of a comprehensive MRSA colonization surveillance program. It is not intended to diagnose MRSA infection nor to guide or monitor treatment for MRSA infections. Test performance is not FDA approved in patients less than 60 years old. Performed at [facility], [address] [place], [place] [address]      Radiology Studies: No results found.  Scheduled Meds:  aspirin EC  81 mg Oral Daily   dextrose       heparin  5,000 Units Subcutaneous Q8H   insulin aspart  0-6 Units Subcutaneous TID WC   labetalol  200 mg Oral BID   multivitamin  1 tablet Oral QHS   pantoprazole  40 mg Oral BID   Continuous Infusions:   LOS: 0 days   Time spent: 48 mins  Isma Tietje Laural Benes, MD How to contact the [facility] Attending or Consulting provider 7A - 7P or covering provider during after hours 7P -7A, for this patient?  Check the care team in [facility] and look for a) attending/consulting TRH provider listed and b) the [facility] team listed Log into [email or web address] and use [place]'s universal password to access. If you do not have the password, please contact the hospital operator. Locate the [facility] provider you are looking for under [facility] and page to a number that you can be directly reached. If you still have difficulty reaching the provider, please page the [facility] (Director on Call) for the Hospitalists listed on amion for assistance.  03/02/2023, 2:07 PM

## 2023-03-02 NOTE — Progress Notes (Signed)
   03/02/23 1311  Vitals  Temp 98.2 F (36.8 C)  Temp Source Oral  BP 132/60  BP Location Left Arm  BP Method Automatic  Patient Position (if appropriate) Lying  Pulse Rate 92  Resp 18  During Treatment Monitoring  Intra-Hemodialysis Comments Tx completed  Post Treatment  Dialyzer Clearance Lightly streaked  Hemodialysis Intake (mL) 0 mL  Liters Processed 80  Fluid Removed (mL) 2500 mL  Tolerated HD Treatment Yes  Post-Hemodialysis Comments Pt goal met.  AVG/AVF Arterial Site Held (minutes) 10 minutes  AVG/AVF Venous Site Held (minutes) 10 minutes  Fistula / Graft Right Upper arm  No placement date or time found.   Orientation: Right  Access Location: Upper arm  Site Condition No complications  Fistula / Graft Assessment Present;Thrill;Bruit  Status Deaccessed  Needle Size 15  Drainage Description None     03/02/23 1311  Vitals  Temp 98.2 F (36.8 C)  Temp Source Oral  BP 132/60  BP Location Left Arm  BP Method Automatic  Patient Position (if appropriate) Lying  Pulse Rate 92  Resp 18  During Treatment Monitoring  Intra-Hemodialysis Comments Tx completed  Post Treatment  Dialyzer Clearance Lightly streaked  Hemodialysis Intake (mL) 0 mL  Liters Processed 80  Fluid Removed (mL) 2500 mL  Tolerated HD Treatment Yes  Post-Hemodialysis Comments Pt goal met.  AVG/AVF Arterial Site Held (minutes) 10 minutes  AVG/AVF Venous Site Held (minutes) 10 minutes  Fistula / Graft Right Upper arm  No placement date or time found.   Orientation: Right  Access Location: Upper arm  Site Condition No complications  Fistula / Graft Assessment Present;Thrill;Bruit  Status Deaccessed  Needle Size 15  Drainage Description None

## 2023-03-02 NOTE — TOC Initial Note (Addendum)
Transition of Care [facility]) - Initial/Assessment Note    Patient Details  Name: [name] MRN: [id number] Date of Birth: 10/02/1940  Transition of Care [facility]) CM/SW Contact:    Villa Herb, LCSWA Phone Number: 03/02/2023, 3:27 PM  Clinical Narrative:                 PT is recommending HH PT for pt at D/C. CSW spoke with pt who states that she is agreeable to this being set up. CSW spoke to [place] with [facility] who states they can accept for [facility] PT, MD placed HH orders. Pt also requesting rolling walker at this time, MD ordered. CSW spoke to [place] with [employer] who will get DME ordered and delivered to pts home. Pt states that her daughter comes and stays with her each night. TOC to follow.   Addendum 3:40pm: CSW updated by Ian Malkin with [employer] that pt received a rollator 05/2020 and insurance will not cover a new walker.   Expected Discharge Plan: Home w Home Health Services Barriers to Discharge: Continued Medical Work up   Patient Goals and CMS Choice Patient states their goals for this hospitalization and ongoing recovery are:: return home with [facility] CMS Medicare.gov Compare Post Acute Care list provided to:: Patient Choice offered to / list presented to : Patient      Expected Discharge Plan and Services In-house Referral: Clinical Social Work Discharge Planning Services: CM Consult Post Acute Care Choice: Home Health, Durable Medical Equipment Living arrangements for the past 2 months: Single Family Home                 DME Arranged: Walker rolling DME Agency: [facility] Date DME Agency Contacted: 03/02/23   Representative spoke with at DME Agency: Ian Malkin HH Arranged: PT HH Agency: [facility] Date [facility] Agency Contacted: 03/02/23   Representative spoke with at [facility] Agency: Clifton Custard  Prior Living Arrangements/Services Living arrangements for the past 2 months: Single Family Home Lives with:: Self Patient language and need for interpreter reviewed:: Yes Do you feel  safe going back to the place where you live?: Yes      Need for Family Participation in Patient Care: Yes (Comment) Care giver support system in place?: Yes (comment)   Criminal Activity/Legal Involvement Pertinent to Current Situation/Hospitalization: No - Comment as needed  Activities of Daily Living Home Assistive Devices/Equipment: Eyeglasses, Dentures (specify type), CBG Meter ADL Screening (condition at time of admission) Patient's cognitive ability adequate to safely complete daily activities?: Yes Is the patient deaf or have difficulty hearing?: Yes Does the patient have difficulty seeing, even when wearing glasses/contacts?: No Does the patient have difficulty concentrating, remembering, or making decisions?: Yes Patient able to express need for assistance with ADLs?: Yes Does the patient have difficulty dressing or bathing?: Yes Independently performs ADLs?: No Communication: Independent Dressing (OT): Needs assistance Is this a change from baseline?: Pre-admission baseline Grooming: Needs assistance Is this a change from baseline?: Pre-admission baseline Feeding: Independent Bathing: Needs assistance Is this a change from baseline?: Pre-admission baseline Toileting: Needs assistance Is this a change from baseline?: Pre-admission baseline In/Out Bed: Needs assistance Is this a change from baseline?: Pre-admission baseline Walks in Home: Needs assistance Is this a change from baseline?: Pre-admission baseline Does the patient have difficulty walking or climbing stairs?: Yes Weakness of Legs: Both Weakness of Arms/Hands: Right  Permission Sought/Granted                  Emotional Assessment Appearance:: Appears  stated age Attitude/Demeanor/Rapport: Engaged Affect (typically observed): Accepting   Alcohol / Substance Use: Not Applicable Psych Involvement: No (comment)  Admission diagnosis:  Weakness [R53.1] Generalized weakness [R53.1] Altered mental state  [R41.82] Patient Active Problem List   Diagnosis Date Noted   Altered mental state 02/28/2023   Generalized weakness 02/27/2023   Elevated ferritin 01/29/2023   Rectal bleed 01/09/2023   Acute blood loss anemia 06/22/2022   Hemothorax 06/22/2022   Hemothorax on left 06/21/2022   Loculated pleural effusion 06/21/2022   Need for management of chest tube 06/20/2022   Delirium 06/14/2022   GERD (gastroesophageal reflux disease) 06/10/2022   CAP (community acquired pneumonia) 06/10/2022   ESRD (end stage renal disease) (HCC) 06/09/2022   DMII (diabetes mellitus, type 2) (HCC) 06/09/2022   Multifocal pneumonia 06/02/2022   Pleural effusion 06/02/2022   End stage renal disease on dialysis (HCC) 06/02/2022   Spinal stenosis of lumbar region 01/07/2022   Schatzki's ring 10/15/2021   Esophageal dysphagia 10/15/2021   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   Hypertensive heart and CKD, 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/2020   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 due to type 2 diabetes mellitus (HCC) 11/09/2020   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 diabetes mellitus (HCC) 11/09/2020   Dependence on renal dialysis (HCC) 11/09/2020   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 disease (HCC) 11/09/2020   Hyperlipidemia associated with type 2 diabetes mellitus (HCC) 11/09/2020   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 10/26/2020   Hypertensive urgency 10/26/2020   History of CVA (cerebrovascular accident) 01/05/2020   Thrombocytopenia (HCC) 01/05/2020   History of colonic polyps 04/01/2018   Acute bronchitis 10/15/2017   Type 2 diabetes mellitus with hypertension and end stage renal disease on  dialysis (HCC)    HCAP (healthcare-associated pneumonia) 05/29/2015   Hyperkalemia 05/29/2015   ESRD (end stage renal disease) on dialysis (HCC)    Gastro-esophageal reflux disease without esophagitis 09/29/2014   Encounter for adequacy testing for hemodialysis (HCC) 01/17/2014   Mechanical complication of other vascular device, implant, and graft 01/17/2014   Disturbances of vision, late effect of stroke 11/03/2013   Right hemiparesis (HCC) 08/19/2013   CVA (cerebral infarction) 07/20/2013   Gastroparesis due to DM (HCC) 07/01/2013   Type 2 diabetes mellitus with hyperglycemia (HCC) 06/23/2013   Anemia 01/26/2013   Fall 05/25/2012   HLD (hyperlipidemia) 05/20/2006   Hereditary and idiopathic peripheral neuropathy 05/20/2006   Essential hypertension 05/20/2006   PCP:  Mirna Mires, MD Pharmacy:   [facility] - [place], [place] - [address] [place] [address] Phone: [phone] Fax: [phone]  [id number] - [place],  - [address] AT [facility] [address] [place] [address] Phone: [phone] Fax: [phone]     Social Determinants of Health (SDOH) Social History: SDOH Screenings   Food Insecurity: Patient Declined (02/27/2023)  Housing: Patient Declined (02/27/2023)  Transportation Needs: Patient Declined (02/27/2023)  Utilities: Patient Declined (02/27/2023)  Depression (PHQ2-9): High Risk (02/17/2023)  Tobacco Use: Low Risk  (02/27/2023)   SDOH Interventions:     Readmission Risk Interventions    06/12/2022    3:57 PM 06/10/2022  11:01 AM  Readmission Risk Prevention Plan  Transportation Screening Complete Complete  HRI or Home Care Consult Complete Complete  Social Work Consult for Recovery Care Planning/Counseling Complete Complete  Palliative Care Screening Not Applicable Not Applicable  Medication Review Ocean[employer]) Complete Complete

## 2023-03-02 NOTE — Consult Note (Signed)
[facility] Renal Consultation Note    Indication for Consultation:  Management of ESRD/hemodialysis; anemia, hypertension/volume and secondary hyperparathyroidism  HPI: [name] is a 82 y.o. female with past medical history significant for T2DM, HTN, prior CVA and longstanding ESRD ( on dialysis 14 years) who presented to [facility] ED via EMS on 02/27/23 complaining of feeling weak as well as SOB and difficulty ambulating.  She was recently started on pregabalin on 02/17/23.  She was admitted for observation and we were consulted to provide dialysis during her stay.  She did have HD on 02/27/23 and has not missed any outpatient HD treatments.  She also noted significant jerking/shaking of her right arm since started pregabalin.      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   Dialysis patient (HCC)    M, W, F   Fistula    R arm   GERD (gastroesophageal reflux disease)    Hypertension    Renal disorder    Shortness of breath    Stroke [facility])    right side weakness   Past Surgical History:  Procedure Laterality Date   AMPUTATION Right 01/13/2020   Procedure: AMPUTATION RIGHT FOOT 1ST RAY;  Surgeon: Nadara Mustard, MD;  Location: [facility] OR;  Service: Orthopedics;  Laterality: Right;   AV FISTULA PLACEMENT Right 09/08/2013   Procedure: CREATION OF RIGHT BRACHIAL CEPHALIC ARTERIOVENOUS FISTULA ;  Surgeon: Pryor Ochoa, MD;  Location: [facility] OR;  Service: Vascular;  Laterality: Right;   BASCILIC VEIN TRANSPOSITION Right 01/26/2014   Procedure: Right Arm BASILIC VEIN TRANSPOSITION;  Surgeon: Pryor Ochoa, MD;  [facility] OR;  Service: Vascular;  Laterality: Right;   BIOPSY  09/10/2021   Procedure: BIOPSY;  Surgeon: Dolores Frame, MD;  [facility];  Service: Gastroenterology;;  gastric   BIOPSY  10/07/2021    Procedure: BIOPSY;  Surgeon: Lemar Lofty., MD;  Location: [facility];  Service: Gastroenterology;;   BIOPSY  04/17/2022   Procedure: BIOPSY;  Surgeon: Lemar Lofty., MD;  Location: [facility];  Service: Gastroenterology;;   BIOPSY  02/03/2023   Procedure: BIOPSY;  Surgeon: Dolores Frame, MD;  [facility];  Service: Gastroenterology;;   CATARACT EXTRACTION W/PHACO  11/20/2011   Procedure: CATARACT EXTRACTION PHACO AND INTRAOCULAR LENS PLACEMENT (IOC);  Surgeon: Gemma Payor, MD;  Location: [facility] ORS;  Service: Ophthalmology;  Laterality: Right;  CDE 18.82   CATARACT EXTRACTION W/PHACO Left 11/18/2012   Procedure: CATARACT EXTRACTION PHACO AND INTRAOCULAR LENS PLACEMENT (IOC);  Surgeon: Gemma Payor, MD;  Location: [facility];  Service: Ophthalmology;  Laterality: Left;  CDE: 18.97   COLONOSCOPY N/A 02/09/2013   Procedure: COLONOSCOPY;  Surgeon: Malissa Hippo, MD;  [facility];  Service: Endoscopy;  Laterality: N/A;  305-moved to 220 Ann to notify pt   COLONOSCOPY N/A 07/08/2018   Procedure: COLONOSCOPY;  Surgeon: Malissa Hippo, MD;  [facility];  Service: Endoscopy;  Laterality: N/A;  930   ENDOSCOPIC MUCOSAL RESECTION N/A 10/07/2021   Procedure: ENDOSCOPIC MUCOSAL RESECTION;  Surgeon: Meridee Score Netty Starring., MD;  Location: [facility];  Service: Gastroenterology;  Laterality: N/A;   ESOPHAGEAL DILATION N/A 02/03/2023   Procedure: ESOPHAGEAL DILATION;  Surgeon: Dolores Frame, MD;  [facility];  Service: Gastroenterology;  Laterality: N/A;  7:30AM;ASA 3   ESOPHAGOGASTRODUODENOSCOPY N/A 04/17/2022   Procedure: ESOPHAGOGASTRODUODENOSCOPY (EGD);  Surgeon: Lemar Lofty., MD;  Location: [facility];  Service: Gastroenterology;  Laterality: N/A;   ESOPHAGOGASTRODUODENOSCOPY (EGD) WITH PROPOFOL N/A 09/10/2021   Procedure: ESOPHAGOGASTRODUODENOSCOPY (EGD) WITH PROPOFOL;  Surgeon: Dolores Frame, MD;   [facility];  Service: Gastroenterology;  Laterality: N/A;   ESOPHAGOGASTRODUODENOSCOPY (EGD) WITH PROPOFOL N/A 10/07/2021   Procedure: ESOPHAGOGASTRODUODENOSCOPY (EGD) WITH PROPOFOL;  Surgeon: Meridee Score Netty Starring., MD;  Location: [facility];  Service: Gastroenterology;  Laterality: N/A;   ESOPHAGOGASTRODUODENOSCOPY (EGD) WITH PROPOFOL N/A 02/03/2023   Procedure: ESOPHAGOGASTRODUODENOSCOPY (EGD) WITH PROPOFOL;  Surgeon: Dolores Frame, MD;  [facility];  Service: Gastroenterology;  Laterality: N/A;  7:30AM;ASA 3   EUS N/A 10/07/2021   Procedure: UPPER ENDOSCOPIC ULTRASOUND (EUS) RADIAL;  Surgeon: Lemar Lofty., MD;  Location: [facility];  Service: Gastroenterology;  Laterality: N/A;   GASTRIC VARICES BANDING  10/07/2021   Procedure: duodenal tumor BANDING;  Surgeon: Meridee Score Netty Starring., MD;  Location: [facility];  Service: Gastroenterology;;  with emr kit   HEMOSTASIS CLIP PLACEMENT  10/07/2021   Procedure: HEMOSTASIS CLIP PLACEMENT;  Surgeon: Lemar Lofty., MD;  Location: [facility];  Service: Gastroenterology;;   INSERTION OF DIALYSIS CATHETER Right 06/24/2013   Procedure: INSERTION OF DIALYSIS CATHETER: Ultrasound guided;  Surgeon: Nada Libman, MD;  Location: [facility] OR;  Service: Vascular;  Laterality: Right;   LIGATION OF ARTERIOVENOUS  FISTULA Right 01/26/2014   Procedure: LIGATION OF ARTERIOVENOUS  FISTULA;  Surgeon: Pryor Ochoa, MD;  Location: [facility] OR;  Service: Vascular;  Laterality: Right;   LOOP RECORDER IMPLANT N/A 07/21/2013   Procedure: LOOP RECORDER IMPLANT;  Surgeon: Marinus Maw, MD;  Location: [facility] CATH LAB;  Service: Cardiovascular;  Laterality: N/A;   POLYPECTOMY  07/08/2018   Procedure: POLYPECTOMY;  Surgeon: Malissa Hippo, MD;  [facility];  Service: Endoscopy;;  Descending colon polyps x 2    SUBMUCOSAL LIFTING INJECTION  10/07/2021   Procedure: SUBMUCOSAL LIFTING INJECTION;  Surgeon: Lemar Lofty., MD;  Location: [facility];  Service: Gastroenterology;;   TEE WITHOUT CARDIOVERSION N/A 07/21/2013   Procedure: TRANSESOPHAGEAL ECHOCARDIOGRAM (TEE);  Surgeon: Lars Masson, MD;  Location: [facility] ENDOSCOPY;  Service: Cardiovascular;  Laterality: N/A;   UPPER ESOPHAGEAL ENDOSCOPIC ULTRASOUND (EUS) N/A 04/17/2022   Procedure: UPPER ESOPHAGEAL ENDOSCOPIC ULTRASOUND (EUS);  Surgeon: Lemar Lofty., MD;  Location: [facility];  Service: Gastroenterology;  Laterality: N/A;   Family History:   Family History  Problem Relation Age of Onset   Cancer Sister    Cancer Brother    Anesthesia problems Neg Hx    Hypotension Neg Hx    Malignant hyperthermia Neg Hx    Pseudochol deficiency Neg Hx    Social History:  reports that she has never smoked. She has never been exposed to tobacco smoke. She has never used smokeless tobacco. She reports that she does not drink alcohol and does not use drugs. Allergies  Allergen Reactions   Ambien [Zolpidem] Other (See Comments)    Hallucinations    Reglan [Metoclopramide] Other (See Comments)    "makes me crazy"   Prior to Admission medications   Medication Sig Start Date End Date Taking? Authorizing Provider  acetaminophen (TYLENOL) 500 MG tablet Take 500 mg by mouth at bedtime.   Yes [provider]  albuterol (VENTOLIN HFA) 108 (90 Base) MCG/ACT inhaler Inhale 2 puffs into  the lungs every 6 (six) hours as needed for wheezing or shortness of breath. 11/21/20  Yes Sharee Holster, NP  aspirin EC 81 MG tablet Take 1 tablet (81 mg total) by mouth daily. Swallow whole. 01/10/23 01/10/24 Yes Shah, Pratik D, DO  cyanocobalamin (,VITAMIN B-12,) 1000 MCG/ML injection Inject 1,000 mcg into the muscle every 30 (thirty) days. 11/01/14  Yes [provider]  HUMULIN 70/30 (70-30) 100 UNIT/ML injection Inject 10-16 Units into the skin See admin instructions. Inject 16 units into the skin every morning with breakfast and 10 units  with  supper only when glucose is above 90. 02/24/23  Yes Nida, Denman George, MD  labetalol (NORMODYNE) 200 MG tablet Take 200 mg by mouth 2 (two) times daily. 01/06/23  Yes [provider]  multivitamin (RENA-VIT) TABS tablet Take 1 tablet by mouth at bedtime. 08/21/14  Yes Kirsteins, Victorino Sparrow, MD  omeprazole (PRILOSEC) 40 MG capsule TAKE ONE CAPSULE BY MOUTH TWICE DAILY BEFORE A meal Patient taking differently: Take 40 mg by mouth in the morning and at bedtime. 04/16/22  Yes Mansouraty, Netty Starring., MD  pregabalin (LYRICA) 25 MG capsule Take 1 capsule (25 mg total) by mouth daily. 02/17/23  Yes Kirsteins, Victorino Sparrow, MD   Current Facility-Administered Medications  Medication Dose Route Frequency Provider Last Rate Last Admin   acetaminophen (TYLENOL) tablet 650 mg  650 mg Oral Q6H PRN Johnson, Clanford L, MD       Or   acetaminophen (TYLENOL) suppository 650 mg  650 mg Rectal Q6H PRN Johnson, Clanford L, MD       albuterol (PROVENTIL) (2.5 MG/3ML) 0.083% nebulizer solution 2.5 mg  2.5 mg Nebulization Q4H PRN Johnson, Clanford L, MD       aspirin EC tablet 81 mg  81 mg Oral Daily Johnson, Clanford L, MD   81 mg at 03/01/23 0900   bisacodyl (DULCOLAX) EC tablet 5 mg  5 mg Oral Daily PRN Johnson, Clanford L, MD       dextrose 50 % solution            fentaNYL (SUBLIMAZE) injection 12.5 mcg  12.5 mcg Intravenous Q2H PRN Johnson, Clanford L, MD   12.5 mcg at 03/02/23 0056   heparin injection 5,000 Units  5,000 Units Subcutaneous Q8H Johnson, Clanford L, MD   5,000 Units at 03/02/23 0518   HYDROcodone-acetaminophen (NORCO/VICODIN) 5-325 MG per tablet 1 tablet  1 tablet Oral Q6H PRN Laural Benes, Clanford L, MD   1 tablet at 03/02/23 0001   hydrOXYzine (ATARAX) tablet 10 mg  10 mg Oral TID PRN Laural Benes, Clanford L, MD   10 mg at 03/02/23 0001   insulin aspart (novoLOG) injection 0-6 Units  0-6 Units Subcutaneous TID WC Johnson, Clanford L, MD   3 Units at 03/01/23 1703   labetalol (NORMODYNE) tablet 200 mg   200 mg Oral BID Johnson, Clanford L, MD   200 mg at 03/01/23 0859   lidocaine (PF) (XYLOCAINE) 1 % injection 5 mL  5 mL Intradermal PRN Sabra Heck B, MD       lidocaine-prilocaine (EMLA) cream 1 Application  1 Application Topical PRN Sabra Heck B, MD       multivitamin (RENA-VIT) tablet 1 tablet  1 tablet Oral QHS Johnson, Clanford L, MD   1 tablet at 03/01/23 2110   ondansetron (ZOFRAN) tablet 4 mg  4 mg Oral Q6H PRN Johnson, Clanford L, MD       Or   ondansetron (ZOFRAN) injection 4  mg  4 mg Intravenous Q6H PRN Johnson, Clanford L, MD       pantoprazole (PROTONIX) EC tablet 40 mg  40 mg Oral BID Laural Benes, Clanford L, MD   40 mg at 03/01/23 2110   pentafluoroprop-tetrafluoroeth (GEBAUERS) aerosol 1 Application  1 Application Topical PRN Arita Miss, MD       Labs: Basic Metabolic Panel: Recent Labs  Lab 02/28/23 0500 03/01/23 0520 03/02/23 0507  NA 138 132* 137  K 3.9 4.1 3.8  CL 99 94* 95*  CO2 27 26 30   GLUCOSE 67* 134* 62*  BUN 35* 23 31*  CREATININE 5.61* 3.99* 5.20*  CALCIUM 7.7* 7.5* 7.8*  PHOS 3.6 2.4* 3.0   Liver Function Tests: Recent Labs  Lab 02/28/23 0500 03/01/23 0520 03/02/23 0507  ALBUMIN 2.8* 2.8* 2.8*   No results for input(s): "LIPASE", "AMYLASE" in the last 168 hours. No results for input(s): "AMMONIA" in the last 168 hours. CBC: Recent Labs  Lab 02/27/23 0657 02/28/23 0500 03/01/23 0520  WBC 8.2 6.6 7.5  HGB 11.0* 10.3* 10.4*  HCT 33.1* 30.1* 31.2*  MCV 94.3 92.3 93.4  PLT 100* 105* 91*   Cardiac Enzymes: No results for input(s): "CKTOTAL", "CKMB", "CKMBINDEX", "TROPONINI" in the last 168 hours. CBG: Recent Labs  Lab 03/01/23 2031 03/02/23 0319 03/02/23 0342 03/02/23 0608 03/02/23 0737  GLUCAP 110* 34* 136* 76 102*   Iron Studies: No results for input(s): "IRON", "TIBC", "TRANSFERRIN", "FERRITIN" in the last 72 hours. Studies/Results: No results found.  ROS: Pertinent items are noted in HPI. Physical Exam: Vitals:    03/01/23 0510 03/01/23 1506 03/01/23 2032 03/02/23 0450  BP: (!) 121/54 117/69 (!) 105/57 (!) 95/55  Pulse: 82 76 84 70  Resp: 20  18 16   Temp: 98.2 F (36.8 C) 97.8 F (36.6 C) 98 F (36.7 C) 98.5 F (36.9 C)  TempSrc: Oral Oral Oral   SpO2: 98% 100% 100% 100%  Weight:      Height:          Weight change:   Intake/Output Summary (Last 24 hours) at 03/02/2023 0852 Last data filed at 03/02/2023 0500 Gross per 24 hour  Intake 390 ml  Output 0 ml  Net 390 ml   BP (!) 95/55 (BP Location: Left Arm)   Pulse 70   Temp 98.5 F (36.9 C)   Resp 16   Ht 5\' 7"  (1.702 m)   Wt 74.2 kg   SpO2 100%   BMI 25.62 kg/m  General appearance: alert, cooperative, and no distress Head: Normocephalic, without obvious abnormality, atraumatic Resp: clear to auscultation bilaterally Cardio: regular rate and rhythm, S1, S2 normal, no murmur, click, rub or gallop GI: soft, non-tender; bowel sounds normal; no masses,  no organomegaly Extremities: extremities normal, atraumatic, no cyanosis or edema and RAVF +T/B Dialysis Access:  Dialysis Orders: Center: [facility] MWF 4 hours EDW 72. HD Bath 2k, Dialyzer unknown, Heparin no. Access AVF 400 BFR. Calcitriol 1 mcg three times per week, mircera 50-  due 7/29  Assessment/Plan:  Generalized weakness - likely due to pregabalin which has been on hold.  Has myoclonic jerking as well.  Hopefully will improve further after HD today.  ESRD -   continue with HD on MWF schedule  Hypertension/volume  -  stable and bp on low side.  Close to edw.    Anemia  - stable  Metabolic bone disease -   continue with home meds  Nutrition -  renal diet, carb modified.  Irena Cords, MD [facility], [facility] 03/02/2023, 8:52 AM

## 2023-03-02 NOTE — Progress Notes (Signed)
Patient alert with confusion, verbalized complaints of itching noted patient had scratch marks all over back given PRN, see MAR. Patient reported that the medication did not help still complaining of itching. MD Laural Benes made aware. New order placed for Benadryl, medication given at 1731.

## 2023-03-02 NOTE — Progress Notes (Signed)
At 0319, notified that patient's blood sugar is 34 mg/dl, activated hypoglycemia protocol and administered dextrose 50 1 ampule, notified Dr. Camillo Flaming and Diana Eves. Patient briefly woke up while this nurse was administering D50 and went back to sleep.  At 0342, rechecked blood sugar is 136 mg/dl, patient is awake, conversant and asked for snacks, updated Dr. Camillo Flaming.

## 2023-03-02 NOTE — Progress Notes (Signed)
Patient screaming that she is sick and in pain, calling for someone unknown to staff, disoriented that she is in the hospital, re-oriented patient and she stated that she is sick and in pain, administered fentanyl as ordered.

## 2023-03-03 DIAGNOSIS — E162 Hypoglycemia, unspecified: Secondary | ICD-10-CM | POA: Diagnosis not present

## 2023-03-03 DIAGNOSIS — G9341 Metabolic encephalopathy: Secondary | ICD-10-CM

## 2023-03-03 DIAGNOSIS — R531 Weakness: Secondary | ICD-10-CM | POA: Diagnosis not present

## 2023-03-03 LAB — CBC
HCT: 33.7 % — ABNORMAL LOW (ref 36.0–46.0)
Hemoglobin: 11.1 g/dL — ABNORMAL LOW (ref 12.0–15.0)
MCH: 31.1 pg (ref 26.0–34.0)
MCHC: 32.9 g/dL (ref 30.0–36.0)
MCV: 94.4 fL (ref 80.0–100.0)
Platelets: 109 10*3/uL — ABNORMAL LOW (ref 150–400)
RBC: 3.57 MIL/uL — ABNORMAL LOW (ref 3.87–5.11)
RDW: 19.3 % — ABNORMAL HIGH (ref 11.5–15.5)
WBC: 8 10*3/uL (ref 4.0–10.5)
nRBC: 0 % (ref 0.0–0.2)

## 2023-03-03 LAB — RENAL FUNCTION PANEL
Albumin: 3 g/dL — ABNORMAL LOW (ref 3.5–5.0)
Anion gap: 14 (ref 5–15)
BUN: 25 mg/dL — ABNORMAL HIGH (ref 8–23)
CO2: 26 mmol/L (ref 22–32)
Calcium: 8 mg/dL — ABNORMAL LOW (ref 8.9–10.3)
Chloride: 93 mmol/L — ABNORMAL LOW (ref 98–111)
Creatinine, Ser: 3.93 mg/dL — ABNORMAL HIGH (ref 0.44–1.00)
GFR, Estimated: 11 mL/min — ABNORMAL LOW (ref >60)
Glucose, Bld: 324 mg/dL — ABNORMAL HIGH (ref 70–99)
Phosphorus: 2.7 mg/dL (ref 2.5–4.6)
Potassium: 4.6 mmol/L (ref 3.5–5.1)
Sodium: 133 mmol/L — ABNORMAL LOW (ref 135–145)

## 2023-03-03 LAB — GLUCOSE, CAPILLARY
Glucose-Capillary: 313 mg/dL — ABNORMAL HIGH (ref 70–99)
Glucose-Capillary: 304 mg/dL — ABNORMAL HIGH (ref 70–99)
Glucose-Capillary: 232 mg/dL — ABNORMAL HIGH (ref 70–99)

## 2023-03-03 LAB — MAGNESIUM: Magnesium: 1.9 mg/dL (ref 1.7–2.4)

## 2023-03-03 MED ORDER — CHLORHEXIDINE GLUCONATE CLOTH 2 % EX PADS: 6.0000 | MEDICATED_PAD | Freq: Every day | CUTANEOUS | Status: DC

## 2023-03-03 MED ORDER — INSULIN GLARGINE 100 UNIT/ML SOLOSTAR PEN: 5.0000 [IU] | PEN_INJECTOR | Freq: Every day | SUBCUTANEOUS | 1 refills | Status: DC

## 2023-03-03 MED ORDER — LINAGLIPTIN 5 MG PO TABS: 5.0000 mg | ORAL_TABLET | Freq: Every day | ORAL | 1 refills | Status: DC

## 2023-03-03 MED ORDER — CALCITRIOL 0.25 MCG PO CAPS: 1.0000 ug | ORAL_CAPSULE | ORAL | Status: DC

## 2023-03-03 MED ORDER — INSULIN PEN NEEDLE 31G X 5 MM MISC: 1.0000 | 0 refills | Status: DC

## 2023-03-03 NOTE — Progress Notes (Signed)
[facility] Progress Note  Name: [name] MRN: [id number] DOB: 04/29/1941  Chief Complaint:  weakness  Subjective:  Last HD on 9/16 with 2.5 kg UF.  Her daughter is at bedside.  She states that mental status is better than when she came in.  She would sometimes know the year.  Doesn't take anything for dementia medication-wise.  The patient's other daughter stays with her at night and takes her to dialysis and she states that the patient isn't strong enough to get into/out of the car by herself. They are hoping she can get some strength back.   Review of systems:  Denies shortness of breath or chest pain  Denies nausea now - some earlier and has an emesis bag  ------  Background on consult:  HPI: [name] is a 82 y.o. 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  She was recently started on pregabalin on 02/17/23.  She was admitted for observation and we were consulted to provide dialysis during her stay.  She did have HD on 02/27/23 and has not missed any outpatient HD treatments.  She also noted significant jerking/shaking of her right arm since started pregabalin.        Intake/Output Summary (Last 24 hours) at 03/03/2023 1005 Last data filed at 03/03/2023 0900 Gross per 24 hour  Intake 600 ml  Output 2500 ml  Net -1900 ml    Vitals:  Vitals:   03/02/23 1332 03/02/23 1421 03/02/23 1950 03/03/23 0421  BP:  126/62 132/69 (!) 133/58  Pulse:  87 92 82  Resp:  18 20 18   Temp:  98.4 F (36.9 C) 97.8 F (36.6 C) 98.6 F (37 C)  TempSrc:  Oral    SpO2:  100% 100% 100%  Weight: 72.5 kg     Height:         Physical Exam:  General elderly female in bed in no acute distress HEENT normocephalic atraumatic extraocular movements intact sclera anicteric Neck supple  trachea midline Lungs clear to auscultation bilaterally normal work of breathing at rest  Heart S1S2 no rub Abdomen soft nontender nondistended Extremities no edema  Psych normal mood and affect Neuro alert and oriented to person and reports at [facility].  Doesn't know year despite multiple guesses.  She cleverly reads off the wall board that it is September 17th  Access RUE AVF bruit and thrill   Medications reviewed   Labs:     Latest Ref Rng & Units 03/03/2023    5:35 AM 03/02/2023    5:07 AM 03/01/2023    5:20 AM  BMP  Glucose 70 - 99 mg/dL 086  62  578   BUN 8 - 23 mg/dL 25  31  23    Creatinine 0.44 - 1.00 mg/dL 4.69  6.29  5.28   Sodium 135 - 145 mmol/L 133  137  132   Potassium 3.5 - 5.1 mmol/L 4.6  3.8  4.1   Chloride 98 - 111 mmol/L 93  95  94   CO2 22 - 32 mmol/L 26  30  26    Calcium 8.9 - 10.3 mg/dL 8.0  7.8  7.5    [facility] MWF 4 hours EDW 72. HD Bath 2k, Dialyzer unknown, Heparin no. Access AVF 400 BFR. Calcitriol 1 mcg three times  per week, mircera 50-  due 7/29  Assessment/Plan:   Generalized weakness - likely due in part to pregabalin which has been on hold.  Has myoclonic jerking as well - optimize with HD.   ESRD -   continue with HD on MWF schedule  Hypertension/volume  -  controlled on current regimen   Anemia of CKD - acceptable; no indication for ESA   Metabolic bone disease -   phos acceptable on current regimen. Add back calcitriol with HD    Disposition per primary team.     Estanislado Emms, MD 03/03/2023 10:20 AM

## 2023-03-03 NOTE — Progress Notes (Signed)
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  She reportedly has been feeling bad for about 1 week very fatigued and having difficulty ambulating.  Family called EMS because they were not able to get her up today.  EMS tried to get her up and she slumped to the side of the bed.  She needed assistance just to sit up.  She did not have her dialysis treatment today but she is normally very compliant with her treatments.  Patient denies any pain symptoms.  When trying to stand up her legs started trembling when she tried to take a step and has been unable to take another step.  Her labs look pretty stable on arrival with a potassium of 3.0.  Her SARS 2 coronavirus test was negative.  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  She has been too weakened to go home and admission requested for further evaluation and management.    PT Comments  Patient agreeable for therapy.  Patient demonstrates good return for sitting up at bedside and completing exercises with verbal cueing, able to ambulate in hallway with slightly labored cadence, one episode of stumbling, able to self correct with CGA and limited for gait training mostly due to fatigue.  Patient tolerated sitting up in chair after therapy.  Patient will  benefit from continued skilled physical therapy in hospital and recommended venue below to increase strength, balance, endurance for safe ADLs and gait.      If plan is discharge home, recommend the following: A little help with walking and/or transfers;A little help with bathing/dressing/bathroom;Help with stairs or ramp for entrance;Assistance with cooking/housework   Can travel by private vehicle        Equipment Recommendations  None recommended by PT    Recommendations for Other Services       Precautions / Restrictions Precautions Precautions: Fall Restrictions Weight Bearing Restrictions: No     Mobility  Bed Mobility Overal bed mobility: Needs Assistance Bed Mobility: Supine to Sit     Supine to sit: Supervision     General bed mobility comments: slightly labored movement    Transfers Overall transfer level: Needs assistance Equipment used: Rolling walker (2 wheels) Transfers: Sit to/from Stand, Bed to chair/wheelchair/BSC Sit to Stand: Contact guard assist           General transfer comment: mild difficulty during stand to sitting in chair due to BLE weakness    Ambulation/Gait Ambulation/Gait assistance: Contact guard assist Gait Distance (Feet): 50 Feet Assistive device: Rolling walker (2 wheels) Gait Pattern/deviations: Decreased step length - right, Decreased step length - left, Decreased stride length Gait velocity: decreased     General Gait Details: slightly labored cadence with one episode of stumbling, able to self correct and limited mostly due to fatigue   Optometrist  Tilt Bed    Modified Rankin (Stroke Patients Only)       Balance Overall balance assessment: Needs assistance Sitting-balance support: Feet supported, No upper extremity supported Sitting balance-Leahy Scale: Good Sitting balance - Comments: seated at EOB   Standing balance support: During functional activity, Bilateral  upper extremity supported, Reliant on assistive device for balance Standing balance-Leahy Scale: Fair Standing balance comment: fair/good using RW                            Cognition Arousal: Alert Behavior During Therapy: WFL for tasks assessed/performed Overall Cognitive Status: Within Functional Limits for tasks assessed                                          Exercises General Exercises - Lower Extremity Long Arc Quad: Seated, AROM, Strengthening, Both, 10 reps Hip Flexion/Marching: Seated, AROM, Strengthening, Both, 10 reps Toe Raises: Seated, AROM, Strengthening, Both, 10 reps Heel Raises: Seated, AROM, Strengthening, Both, 10 reps    General Comments        Pertinent Vitals/Pain Pain Assessment Pain Assessment: Faces Faces Pain Scale: Hurts a little bit Pain Location: stomach ache Pain Descriptors / Indicators: Aching Pain Intervention(s): Limited activity within patient's tolerance, Monitored during session, Repositioned    Home Living                          Prior Function            PT Goals (current goals can now be found in the care plan section) Acute Rehab PT Goals Patient Stated Goal: return home PT Goal Formulation: With patient Time For Goal Achievement: 03/15/23 Potential to Achieve Goals: Good Progress towards PT goals: Progressing toward goals    Frequency    Min 3X/week      PT Plan      Co-evaluation              AM-PAC PT "6 Clicks" Mobility   Outcome Measure  Help needed turning from your back to your side while in a flat bed without using bedrails?: None Help needed moving from lying on your back to sitting on the side of a flat bed without using bedrails?: None Help needed moving to and from a bed to a chair (including a wheelchair)?: A Little Help needed standing up from a chair using your arms (e.g., wheelchair or bedside chair)?: A Little Help needed to walk in hospital room?:  A Little Help needed climbing 3-5 steps with a railing? : A Little 6 Click Score: 20    End of Session   Activity Tolerance: Patient tolerated treatment well;Patient limited by fatigue Patient left: in chair;with call bell/phone within reach;with chair alarm set Nurse Communication: Mobility status PT Visit Diagnosis: Unsteadiness on feet (R26.81);Muscle weakness (generalized) (M62.81);Other abnormalities of gait and mobility (R26.89)     Time: 7253-6644 PT Time Calculation (min) (ACUTE ONLY): 27 min  Charges:    $Gait Training: 8-22 mins $Therapeutic Exercise: 8-22 mins PT General Charges $$ ACUTE PT VISIT: 1 Visit                     11:22 AM, 03/03/23 Ocie Bob, MPT Physical Therapist with [facility] [phone] office [phone] mobile phone

## 2023-03-03 NOTE — Progress Notes (Signed)
Alert and oriented x 3, does not know day and time.  Received zofran for nausea.  Starting lantus insulin pen and teaching of pen use demonstrated and return demonstrated 5 times with patient and daughter. Needs reinforcement.  Understands to record blood glucose readings to bring to doctor's appointment since starting new DM med regimen.  IV removed and scripts sent to pharmacy.  To follow up with primary md, endocrinologist and others.  Transported by WC to main entrance and daughter to drive home

## 2023-03-03 NOTE — Inpatient Diabetes Management (Signed)
Inpatient Diabetes Program Recommendations  AACE/ADA: New Consensus Statement on Inpatient Glycemic Control   Target Ranges:  Prepandial:   less than 140 mg/dL      Peak postprandial:   less than 180 mg/dL (1-2 hours)      Critically ill patients:  140 - 180 mg/dL    Latest Reference Range & Units 03/03/23 05:35  Glucose 70 - 99 mg/dL 161 (H)    Latest Reference Range & Units 03/02/23 03:19 03/02/23 03:42 03/02/23 06:08 03/02/23 07:37 03/02/23 11:21 03/02/23 16:20 03/02/23 19:50 03/03/23 02:08  Glucose-Capillary 70 - 99 mg/dL 34 (LL) 096 (H) 76 045 (H) 137 (H) 184 (H) 302 (H) 313 (H)   Review of Glycemic Control  Diabetes history: DM2 Outpatient Diabetes medications: 70/30 16 units BID Current orders for Inpatient glycemic control: Novolog 0-6 units TID with meals  Inpatient Diabetes Program Recommendations:     Insulin: Was ordered 70/30 8 units BID which was discontinued on 9/16 due to hypoglycemia. CBG up to 313 mg/dl at 4:09 am today and lab glucose 324 mg/dl at 8:11 am today. Please consider ordering Semglee 5 units Q24H.  Thanks, Orlando Penner, RN, MSN, CDCES Diabetes Coordinator Inpatient Diabetes Program [phone] (Team Pager from 8am to 5pm)

## 2023-03-03 NOTE — Progress Notes (Signed)
Pt was restless and awake most of the night.  She transferred from bed to chair with walker several times. Benadryl was administered due to itching. No hypoglycemic episodes overnight. Wardell Heath Gerrianne Scale

## 2023-03-03 NOTE — Plan of Care (Signed)
Problem: Activity: Goal: Risk for activity intolerance will decrease Outcome: Progressing   Problem: Nutrition: Goal: Adequate nutrition will be maintained Outcome: Progressing

## 2023-03-03 NOTE — Discharge Instructions (Signed)
IMPORTANT INFORMATION: PAY CLOSE ATTENTION  ? ?PHYSICIAN DISCHARGE INSTRUCTIONS ? ?Follow with Primary care provider  Mirna Mires, MD  and other consultants as instructed by your Hospitalist Physician ? ?SEEK MEDICAL CARE OR RETURN TO EMERGENCY ROOM IF SYMPTOMS COME BACK, WORSEN OR NEW PROBLEM DEVELOPS  ? ?Please note: ?You were cared for by a hospitalist during your hospital stay. Every effort will be made to forward records to your primary care provider.  You can request that your primary care provider send for your hospital records if they have not received them.  Once you are discharged, your primary care physician will handle any further medical issues. Please note that NO REFILLS for any discharge medications will be authorized once you are discharged, as it is imperative that you return to your primary care physician (or establish a relationship with a primary care physician if you do not have one) for your post hospital discharge needs so that they can reassess your need for medications and monitor your lab values. ? ?Please get a complete blood count and chemistry panel checked by your Primary MD at your next visit, and again as instructed by your Primary MD. ? ?Get Medicines reviewed and adjusted: ?Please take all your medications with you for your next visit with your Primary MD ? ?Laboratory/radiological data: ?Please request your Primary MD to go over all hospital tests and procedure/radiological results at the follow up, please ask your primary care provider to get all Hospital records sent to his/her office. ? ?In some cases, they will be blood work, cultures and biopsy results pending at the time of your discharge. Please request that your primary care provider follow up on these results. ? ?If you are diabetic, please bring your blood sugar readings with you to your follow up appointment with primary care.   ? ?Please call and make your follow up appointments as soon as possible.   ? ?Also Note the  following: ?If you experience worsening of your admission symptoms, develop shortness of breath, life threatening emergency, suicidal or homicidal thoughts you must seek medical attention immediately by calling 911 or calling your MD immediately  if symptoms less severe. ? ?You must read complete instructions/literature along with all the possible adverse reactions/side effects for all the Medicines you take and that have been prescribed to you. Take any new Medicines after you have completely understood and accpet all the possible adverse reactions/side effects.  ? ?Do not drive when taking Pain medications or sleeping medications (Benzodiazepines) ? ?Do not take more than prescribed Pain, Sleep and Anxiety Medications. It is not advisable to combine anxiety,sleep and pain medications without talking with your primary care practitioner ? ?Special Instructions: If you have smoked or chewed Tobacco  in the last 2 yrs please stop smoking, stop any regular Alcohol  and or any Recreational drug use. ? ?Wear Seat belts while driving.  Do not drive if taking any narcotic, mind altering or controlled substances or recreational drugs or alcohol.  ? ? ? ? ? ?

## 2023-03-03 NOTE — Discharge Summary (Signed)
Physician Discharge Summary  [name] NUU:[id number] DOB: 1940/09/13 DOA: 02/27/2023  PCP: Mirna Mires, MD  Admit date: 02/27/2023 Discharge date: 03/03/2023  Admitted From:  Home  Disposition: Home with HH   Recommendations for Outpatient Follow-up:  Follow up with PCP in 1 weeks Follow up with endocrinology in 2 weeks Resume regular hemodialysis schedule  Home Health:  PT   Discharge Condition: STABLE   CODE STATUS: FULL DIET: renal/carb modified    Brief Hospitalization Summary: Please see all hospital notes, images, labs for full details of the hospitalization. 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  She reportedly has been feeling bad for about 1 week very fatigued and having difficulty ambulating.  Family called EMS because they were not able to get her up today.  EMS tried to get her up and she slumped to the side of the bed.  She needed assistance just to sit up.  She did not have her dialysis treatment today but she is normally very compliant with her treatments.  Patient denies any pain symptoms.  When trying to stand up her legs started trembling when she tried to take a step and has been unable to take another step.  Her labs look pretty stable on arrival with a potassium of 3.0.  Her SARS 2 coronavirus test was negative.  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  She has been too weakened to go home and admission  requested for further evaluation and management.    Hospital Course by Problem  Generalized Weakness - Much Improved  - suspect mostly due to medication side effect as this occurred shortly after starting pregabalin - DC pregabalin - PT evaluation pending  - MRI with no finding of acute CVA - hemodialysis on schedule MWF   Gait Instability - IMPROVED  - initially reported inability to ambulate - PT evaluated twice and recommending HHPT - Pt was able to ambulate well with PT today prior to discharge - Home health PT orders completed    Intention Tremor and myoclonic jerking - likely a side effect of pregabalin - resolving as pregabalin washes out  - added pregabalin to allergy list    ESRD on HD (MWF) - pt reports excellent compliance with HD treatments - nephrology consulted for inpatient HD  - resumed home medications    History of CVA with residual chronic right hemiparesis - CT head and MRI negative for acute CVA  - continue current medical management for secondary prevention    GERD  - resume PPI therapy for GI protection    Esophageal Dysphagia  - soft foods diet for better digestion   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 Fransico Him  - discontinued 70/30 insulin due to severe hypoglycemia - tradjenta 5 mg daily, lantus solostar 5 units daily - close outpatient follow up with PCP and endocrinologist recommended CBG (last 3)  Recent Labs (last 2 labs)       Recent Labs    03/02/23  5784 03/02/23 0737 03/02/23 1121  GLUCAP 76 102* 137*      Hypoglycemia from insulin - pt had very low BS overnight - daughter says that this has been happening at home as well - DC 70/30 insulin  - would not restart 70/30 insulin at discharge    Anemia in CKD  - Hg stable from recent testing   Hyperlipidemia  - resumed home lipid lowering medication   Pleural Effusion  - chronic and asymptomatic, monitor - volume  management with hemodialysis    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 - stop pregabalin due to adverse side effects   Chronic constipation  - laxatives     Discharge Diagnoses:  Principal Problem:   Generalized weakness Active Problems:   HLD (hyperlipidemia)   Essential hypertension   Anemia   Gastroparesis due to DM (HCC)   Right hemiparesis (HCC)   ESRD (end stage renal disease) on dialysis (HCC)   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 (HCC)   History of CVA (cerebrovascular accident)   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 diabetes mellitus (HCC)   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 disease (HCC)   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)   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)   Chronic constipation   Esophageal dysphagia   Spinal stenosis of lumbar region   Gastro-esophageal reflux disease without esophagitis   Type 2 diabetes mellitus with hyperglycemia (HCC)   Fall   Pleural effusion   Altered mental state   Acute metabolic encephalopathy   Discharge Instructions: Discharge Instructions     Referral to Nutrition and Diabetes Services   Complete by: As directed    Choose type of Diabetes Self-Management Training (DSMT) training services and number of hours requested: Initial DSMT: 10 hours   Check all special needs that apply to patient requiring 1 on 1 DSMT: Low literacy   DSMT Content: Comprehensive self-management skills- All of the content areas   Choose the type of Medical Nutrition Therapy (MNT) and number of hours: Initial MNT: 3 hours   FOR MEDICARE PATIENTS: I hereby certify that I am managing this beneficiary's diabetes condition and that the above prescribed training is a necessary part of management.: Yes      Allergies as of 03/03/2023       Reactions   Ambien [zolpidem] Other (See Comments)   Hallucinations    Reglan [metoclopramide] Other (See  Comments)   "makes me crazy"        Medication List     STOP taking these medications    HumuLIN 70/30 (70-30) 100 UNIT/ML injection Generic drug: insulin NPH-regular Human   pregabalin 25 MG capsule Commonly known as: Lyrica       TAKE these medications    acetaminophen 500 MG tablet Commonly known as: TYLENOL Take 500 mg by mouth at bedtime.   albuterol 108 (90 Base) MCG/ACT inhaler Commonly known as: VENTOLIN HFA Inhale 2 puffs into the lungs every 6 (six) hours as needed for wheezing or shortness of breath.   aspirin EC 81 MG tablet Take 1 tablet (81 mg total) by mouth daily. Swallow whole.   cyanocobalamin 1000 MCG/ML injection Commonly known as: VITAMIN B12 Inject 1,000 mcg into the muscle every 30 (thirty) days.   insulin glargine 100 UNIT/ML Solostar Pen Commonly known as: LANTUS Inject 5 Units into the skin daily. Or as directed by physician   Insulin Pen Needle  31G X 5 MM Misc 1 Device by Does not apply route as directed.   labetalol 200 MG tablet Commonly known as: NORMODYNE Take 200 mg by mouth 2 (two) times daily.   linagliptin 5 MG Tabs tablet Commonly known as: TRADJENTA Take 1 tablet (5 mg total) by mouth daily.   multivitamin Tabs tablet Take 1 tablet by mouth at bedtime.   omeprazole 40 MG capsule Commonly known as: PRILOSEC TAKE ONE CAPSULE BY MOUTH TWICE DAILY BEFORE A meal What changed: See the new instructions.               Durable Medical Equipment  (From admission, onward)           Start     Ordered   03/02/23 1403  For home use only DME [place] rolling  Once       Question Answer Comment  [place]: With 5 Inch Wheels   Patient needs a walker to treat with the following condition Gait instability      03/02/23 1402            Follow-up Information     Mirna Mires, MD. Schedule an appointment as soon as possible for a visit in 1 week(s).   Specialty: Family Medicine Why: Hospital Follow Up Contact  information: [address] [place] [address] [phone]         Roma Kayser, MD. Schedule an appointment as soon as possible for a visit in 2 week(s).   Specialty: Endocrinology Why: Hospital Follow Up Contact information: [address] [place] [address] [phone]                Allergies  Allergen Reactions   Ambien [Zolpidem] Other (See Comments)    Hallucinations    Reglan [Metoclopramide] Other (See Comments)    "makes me crazy"   Allergies as of 03/03/2023       Reactions   Ambien [zolpidem] Other (See Comments)   Hallucinations    Reglan [metoclopramide] Other (See Comments)   "makes me crazy"        Medication List     STOP taking these medications    HumuLIN 70/30 (70-30) 100 UNIT/ML injection Generic drug: insulin NPH-regular Human   pregabalin 25 MG capsule Commonly known as: Lyrica       TAKE these medications    acetaminophen 500 MG tablet Commonly known as: TYLENOL Take 500 mg by mouth at bedtime.   albuterol 108 (90 Base) MCG/ACT inhaler Commonly known as: VENTOLIN HFA Inhale 2 puffs into the lungs every 6 (six) hours as needed for wheezing or shortness of breath.   aspirin EC 81 MG tablet Take 1 tablet (81 mg total) by mouth daily. Swallow whole.   cyanocobalamin 1000 MCG/ML injection Commonly known as: VITAMIN B12 Inject 1,000 mcg into the muscle every 30 (thirty) days.   insulin glargine 100 UNIT/ML Solostar Pen Commonly known as: LANTUS Inject 5 Units into the skin daily. Or as directed by physician   Insulin Pen Needle 31G X 5 MM Misc 1 Device by Does not apply route as directed.   labetalol 200 MG tablet Commonly known as: NORMODYNE Take 200 mg by mouth 2 (two) times daily.   linagliptin 5 MG Tabs tablet Commonly known as: TRADJENTA Take 1 tablet (5 mg total) by mouth daily.   multivitamin Tabs tablet Take 1 tablet by mouth at bedtime.   omeprazole 40 MG capsule Commonly  known as: PRILOSEC TAKE  ONE CAPSULE BY MOUTH TWICE DAILY BEFORE A meal What changed: See the new instructions.               Durable Medical Equipment  (From admission, onward)           Start     Ordered   03/02/23 1403  For home use only DME [place] rolling  Once       Question Answer Comment  [place]: With 5 Inch Wheels   Patient needs a walker to treat with the following condition Gait instability      03/02/23 1402            Procedures/Studies: MR BRAIN WO CONTRAST  Result Date: 02/27/2023 CLINICAL DATA:  Provided history: Neuro deficit, acute, stroke suspected. EXAM: MRI HEAD WITHOUT CONTRAST TECHNIQUE: Multiplanar, multiecho pulse sequences of the brain and surrounding structures were obtained without intravenous contrast. COMPARISON:  Head CT 02/27/2023.  Brain MRI 01/10/2020. FINDINGS: Mild intermittent motion degradation. Brain: Generalized parenchymal atrophy. Redemonstrated small chronic cortical/subcortical infarct within the left parietal lobe. Advanced patchy and confluent T2 FLAIR hyperintense signal abnormality within the cerebral white matter, nonspecific but compatible with chronic small vessel ischemic disease. There are superimposed chronic white matter infarcts within the bilateral cerebral hemispheres, the majority of which were better appreciated on the prior brain MRI of 08/20/2013 (acute at that time). Moderate chronic small vessel ischemic changes within the pons. Multiple chronic infarcts within the cerebellum, the largest within the left hemisphere measuring 13 mm (series 10, image 6). There are a few punctate chronic microhemorrhages scattered within the supratentorial brain and cerebellum. There is no acute infarct. No evidence of an intracranial mass. No extra-axial fluid collection. No midline shift. Vascular: Maintained flow voids within the proximal large arterial vessels. Skull and upper cervical spine: No focal suspicious marrow lesion.  Sinuses/Orbits: No mass or acute finding within the imaged orbits. Prior bilateral ocular lens replacement. No significant paranasal sinus disease. IMPRESSION: 1. Mildly motion degraded exam. 2. No evidence of an acute intracranial abnormality. 3. Redemonstrated small chronic cortical/subcortical infarct within the left parietal lobe. 4. Extensive chronic ischemic gliosis within the cerebral white matter, similar to the prior brain MRI of 01/10/2020. Moderate chronic small vessel ischemic changes within the pons, also similar to this prior exam. 5. Chronic infarcts within bilateral cerebral hemispheres, unchanged. 6. Generalized parenchymal atrophy. Electronically Signed   By: Jackey Loge D.O.   On: 02/27/2023 13:23   CT Head Wo Contrast  Result Date: 02/27/2023 CLINICAL DATA:  Neuro deficit, acute, stroke suspected. Lower extremity weakness. EXAM: CT HEAD WITHOUT CONTRAST TECHNIQUE: Contiguous axial images were obtained from the base of the skull through the vertex without intravenous contrast. RADIATION DOSE REDUCTION: This exam was performed according to the departmental dose-optimization program which includes automated exposure control, adjustment of the mA and/or kV according to patient size and/or use of iterative reconstruction technique. COMPARISON:  Head CT 06/14/2022. FINDINGS: Brain: No acute hemorrhage. Unchanged severe chronic small-vessel disease with old infarct in the left superior parietal lobe. Cortical gray-white differentiation is otherwise preserved. Prominence of the ventricles and sulci within expected range for age. No hydrocephalus or extra-axial collection. No mass effect or midline shift. Vascular: No hyperdense vessel or unexpected calcification. Skull: No calvarial fracture or suspicious bone lesion. Skull base is unremarkable. Sinuses/Orbits: No acute finding. Other: None. IMPRESSION: 1. No acute intracranial abnormality. 2. Unchanged severe chronic small-vessel disease with old  infarct in the left superior parietal lobe. Electronically Signed  By: Orvan Falconer M.D.   On: 02/27/2023 08:34   DG Chest 2 View  Result Date: 02/27/2023 CLINICAL DATA:  Dyspnea. EXAM: CHEST - 2 VIEW COMPARISON:  July 04, 2022. FINDINGS: Stable cardiomegaly. Small bilateral pleural effusions are noted with associated bibasilar subsegmental atelectasis. Bony thorax is unremarkable. IMPRESSION: Small pleural effusions with associated bibasilar subsegmental atelectasis. Electronically Signed   By: Lupita Raider M.D.   On: 02/27/2023 08:28     Subjective: Pt ambulated well with PT and agreeable to home health PT and going home.   Discharge Exam: Vitals:   03/02/23 1950 03/03/23 0421  BP: 132/69 (!) 133/58  Pulse: 92 82  Resp: 20 18  Temp: 97.8 F (36.6 C) 98.6 F (37 C)  SpO2: 100% 100%   Vitals:   03/02/23 1332 03/02/23 1421 03/02/23 1950 03/03/23 0421  BP:  126/62 132/69 (!) 133/58  Pulse:  87 92 82  Resp:  18 20 18   Temp:  98.4 F (36.9 C) 97.8 F (36.6 C) 98.6 F (37 C)  TempSrc:  Oral    SpO2:  100% 100% 100%  Weight: 72.5 kg     Height:       General: Pt is alert, awake, not in acute distress Cardiovascular: normal S1/S2 +, no rubs, no gallops Respiratory: CTA bilaterally, no wheezing, no rhonchi Abdominal: Soft, NT, ND, bowel sounds + Extremities: no edema, no cyanosis   The results of significant diagnostics from this hospitalization (including imaging, microbiology, ancillary and laboratory) are listed below for reference.     Microbiology: Recent Results (from the past 240 hour(s))  SARS Coronavirus 2 by RT PCR (hospital order, performed in [facility] hospital lab) *cepheid single result test* Anterior Nasal Swab     Status: None   Collection Time: 02/27/23  7:36 AM   Specimen: Anterior Nasal Swab  Result Value Ref Range Status   SARS Coronavirus 2 by RT PCR NEGATIVE NEGATIVE Final    Comment: (NOTE) SARS-CoV-2 target nucleic acids are NOT  DETECTED.  The SARS-CoV-2 RNA is generally detectable in upper and lower respiratory specimens during the acute phase of infection. The lowest concentration of SARS-CoV-2 viral copies this assay can detect is 250 copies / mL. A negative result does not preclude SARS-CoV-2 infection and should not be used as the sole basis for treatment or other patient management decisions.  A negative result may occur with improper specimen collection / handling, submission of specimen other than nasopharyngeal swab, presence of viral mutation(s) within the areas targeted by this assay, and inadequate number of viral copies (<250 copies / mL). A negative result must be combined with clinical observations, patient history, and epidemiological information.  Fact Sheet for Patients:   RoadLapTop.co.za  Fact Sheet for Healthcare Providers: http://kim-miller.com/  This test is not yet approved or  cleared by the [place] FDA and has been authorized for detection and/or diagnosis of SARS-CoV-2 by FDA under an Emergency Use Authorization (EUA).  This EUA will remain in effect (meaning this test can be used) for the duration of the COVID-19 declaration under Section 564(b)(1) of the Act, 21 U.S.C. section 360bbb-3(b)(1), unless the authorization is terminated or revoked sooner.  Performed at [facility], [address], [place], [place] [address]   MRSA Next Gen by PCR, Nasal     Status: None   Collection Time: 02/27/23  4:56 PM   Specimen: Nasal Mucosa; Nasal Swab  Result Value Ref Range Status   MRSA by PCR Next  Gen NOT DETECTED NOT DETECTED Final    Comment: (NOTE) The GeneXpert MRSA Assay (FDA approved for NASAL specimens only), is one component of a comprehensive MRSA colonization surveillance program. It is not intended to diagnose MRSA infection nor to guide or monitor treatment for MRSA infections. Test performance is not FDA approved in  patients less than 25 years old. Performed at [facility], [address], [place], [place] [address]      Labs: BNP (last 3 results) Recent Labs    05/09/22 0659 06/09/22 2000 02/27/23 0916  BNP 2,370.0* 1,518.0* 976.0*   Basic Metabolic Panel: Recent Labs  Lab 02/27/23 0657 02/28/23 0500 03/01/23 0520 03/02/23 0507 03/03/23 0535  NA 138 138 132* 137 133*  K 3.0* 3.9 4.1 3.8 4.6  CL 94* 99 94* 95* 93*  CO2 29 27 26 30 26   GLUCOSE 96 67* 134* 62* 324*  BUN 29* 35* 23 31* 25*  CREATININE 4.82* 5.61* 3.99* 5.20* 3.93*  CALCIUM 7.8* 7.7* 7.5* 7.8* 8.0*  MG 1.8 2.0  --   --  1.9  PHOS  --  3.6 2.4* 3.0 2.7   Liver Function Tests: Recent Labs  Lab 02/28/23 0500 03/01/23 0520 03/02/23 0507 03/03/23 0535  ALBUMIN 2.8* 2.8* 2.8* 3.0*   No results for input(s): "LIPASE", "AMYLASE" in the last 168 hours. No results for input(s): "AMMONIA" in the last 168 hours. CBC: Recent Labs  Lab 02/27/23 0657 02/28/23 0500 03/01/23 0520 03/03/23 0535  WBC 8.2 6.6 7.5 8.0  HGB 11.0* 10.3* 10.4* 11.1*  HCT 33.1* 30.1* 31.2* 33.7*  MCV 94.3 92.3 93.4 94.4  PLT 100* 105* 91* 109*   Cardiac Enzymes: No results for input(s): "CKTOTAL", "CKMB", "CKMBINDEX", "TROPONINI" in the last 168 hours. BNP: Invalid input(s): "POCBNP" CBG: Recent Labs  Lab 03/02/23 1620 03/02/23 1950 03/03/23 0208 03/03/23 0740 03/03/23 1128  GLUCAP 184* 302* 313* 304* 232*   D-Dimer No results for input(s): "DDIMER" in the last 72 hours. Hgb A1c No results for input(s): "HGBA1C" in the last 72 hours. Lipid Profile No results for input(s): "CHOL", "HDL", "LDLCALC", "TRIG", "CHOLHDL", "LDLDIRECT" in the last 72 hours. Thyroid function studies No results for input(s): "TSH", "T4TOTAL", "T3FREE", "THYROIDAB" in the last 72 hours.  Invalid input(s): "FREET3" Anemia work up No results for input(s): "VITAMINB12", "FOLATE", "FERRITIN", "TIBC", "IRON", "RETICCTPCT" in the last 72 hours. Urinalysis     Component Value Date/Time   COLORURINE YELLOW 10/15/2017 2244   APPEARANCEUR HAZY (A) 10/15/2017 2244   LABSPEC 1.015 10/15/2017 2244   PHURINE 7.0 10/15/2017 2244   GLUCOSEU >=500 (A) 10/15/2017 2244   HGBUR SMALL (A) 10/15/2017 2244   BILIRUBINUR NEGATIVE 10/15/2017 2244   KETONESUR NEGATIVE 10/15/2017 2244   PROTEINUR 100 (A) 10/15/2017 2244   UROBILINOGEN 0.2 11/19/2014 0909   NITRITE NEGATIVE 10/15/2017 2244   LEUKOCYTESUR MODERATE (A) 10/15/2017 2244   Sepsis Labs Recent Labs  Lab 02/27/23 0657 02/28/23 0500 03/01/23 0520 03/03/23 0535  WBC 8.2 6.6 7.5 8.0   Microbiology Recent Results (from the past 240 hour(s))  SARS Coronavirus 2 by RT PCR (hospital order, performed in [name] hospital lab) *cepheid single result test* Anterior Nasal Swab     Status: None   Collection Time: 02/27/23  7:36 AM   Specimen: Anterior Nasal Swab  Result Value Ref Range Status   SARS Coronavirus 2 by RT PCR NEGATIVE NEGATIVE Final    Comment: (NOTE) SARS-CoV-2 target nucleic acids are NOT DETECTED.  The SARS-CoV-2 RNA is generally  detectable in upper and lower respiratory specimens during the acute phase of infection. The lowest concentration of SARS-CoV-2 viral copies this assay can detect is 250 copies / mL. A negative result does not preclude SARS-CoV-2 infection and should not be used as the sole basis for treatment or other patient management decisions.  A negative result may occur with improper specimen collection / handling, submission of specimen other than nasopharyngeal swab, presence of viral mutation(s) within the areas targeted by this assay, and inadequate number of viral copies (<250 copies / mL). A negative result must be combined with clinical observations, patient history, and epidemiological information.  Fact Sheet for Patients:   RoadLapTop.co.za  Fact Sheet for Healthcare  Providers: http://kim-miller.com/  This test is not yet approved or  cleared by the [place] FDA and has been authorized for detection and/or diagnosis of SARS-CoV-2 by FDA under an Emergency Use Authorization (EUA).  This EUA will remain in effect (meaning this test can be used) for the duration of the COVID-19 declaration under Section 564(b)(1) of the Act, 21 U.S.C. section 360bbb-3(b)(1), unless the authorization is terminated or revoked sooner.  Performed at [facility], [address], [place], [place] [address]   MRSA Next Gen by PCR, Nasal     Status: None   Collection Time: 02/27/23  4:56 PM   Specimen: Nasal Mucosa; Nasal Swab  Result Value Ref Range Status   MRSA by PCR Next Gen NOT DETECTED NOT DETECTED Final    Comment: (NOTE) The GeneXpert MRSA Assay (FDA approved for NASAL specimens only), is one component of a comprehensive MRSA colonization surveillance program. It is not intended to diagnose MRSA infection nor to guide or monitor treatment for MRSA infections. Test performance is not FDA approved in patients less than 39 years old. Performed at [facility], [address], [place], [place] [address]    Time coordinating discharge: 44 mins  SIGNED:  Standley Dakins, MD  [facility] 03/03/2023, 11:51 AM How to contact the [facility] Attending or Consulting provider 7A - 7P or covering provider during after hours 7P -7A, for this patient?  Check the care team in [facility] and look for a) attending/consulting [facility] provider listed and b) the [facility] team listed Log into [email or web address] and use [place]'s universal password to access. If you do not have the password, please contact the hospital operator. Locate the [facility] provider you are looking for under [facility] and page to a number that you can be directly reached. If you still have difficulty reaching the provider, please page the [facility] (Director on Call) for the Hospitalists listed on amion  for assistance.

## 2023-03-04 DIAGNOSIS — N186 End stage renal disease: Secondary | ICD-10-CM | POA: Diagnosis not present

## 2023-03-04 DIAGNOSIS — Z992 Dependence on renal dialysis: Secondary | ICD-10-CM | POA: Diagnosis not present

## 2023-03-06 DIAGNOSIS — Z992 Dependence on renal dialysis: Secondary | ICD-10-CM | POA: Diagnosis not present

## 2023-03-06 DIAGNOSIS — N186 End stage renal disease: Secondary | ICD-10-CM | POA: Diagnosis not present

## 2023-03-09 DIAGNOSIS — Z992 Dependence on renal dialysis: Secondary | ICD-10-CM | POA: Diagnosis not present

## 2023-03-09 DIAGNOSIS — N186 End stage renal disease: Secondary | ICD-10-CM | POA: Diagnosis not present

## 2023-03-10 DIAGNOSIS — R531 Weakness: Secondary | ICD-10-CM | POA: Diagnosis not present

## 2023-03-10 DIAGNOSIS — Z0001 Encounter for general adult medical examination with abnormal findings: Secondary | ICD-10-CM | POA: Diagnosis not present

## 2023-03-10 DIAGNOSIS — N186 End stage renal disease: Secondary | ICD-10-CM | POA: Diagnosis not present

## 2023-03-10 DIAGNOSIS — D509 Iron deficiency anemia, unspecified: Secondary | ICD-10-CM | POA: Diagnosis not present

## 2023-03-10 DIAGNOSIS — I120 Hypertensive chronic kidney disease with stage 5 chronic kidney disease or end stage renal disease: Secondary | ICD-10-CM | POA: Diagnosis not present

## 2023-03-10 DIAGNOSIS — Z992 Dependence on renal dialysis: Secondary | ICD-10-CM | POA: Diagnosis not present

## 2023-03-11 DIAGNOSIS — Z992 Dependence on renal dialysis: Secondary | ICD-10-CM | POA: Diagnosis not present

## 2023-03-11 DIAGNOSIS — N186 End stage renal disease: Secondary | ICD-10-CM | POA: Diagnosis not present

## 2023-03-12 ENCOUNTER — Inpatient Hospital Stay (HOSPITAL_COMMUNITY)
Admission: EM | Admit: 2023-03-12 | Discharge: 2023-03-17 | DRG: 208 | Disposition: E | Payer: 59 | Attending: Student | Admitting: Student

## 2023-03-12 ENCOUNTER — Emergency Department (HOSPITAL_COMMUNITY): Payer: 59

## 2023-03-12 ENCOUNTER — Encounter (HOSPITAL_COMMUNITY): Payer: Self-pay | Admitting: Family Medicine

## 2023-03-12 ENCOUNTER — Ambulatory Visit
Admission: EM | Admit: 2023-03-12 | Discharge: 2023-03-12 | Disposition: A | Discharge status: Home / Self Care | Payer: 59

## 2023-03-12 ENCOUNTER — Other Ambulatory Visit: Payer: Self-pay

## 2023-03-12 ENCOUNTER — Encounter: Payer: Self-pay | Admitting: Emergency Medicine

## 2023-03-12 DIAGNOSIS — R0682 Tachypnea, not elsewhere classified: Secondary | ICD-10-CM

## 2023-03-12 DIAGNOSIS — I259 Chronic ischemic heart disease, unspecified: Secondary | ICD-10-CM | POA: Diagnosis present

## 2023-03-12 DIAGNOSIS — I959 Hypotension, unspecified: Secondary | ICD-10-CM | POA: Diagnosis present

## 2023-03-12 DIAGNOSIS — N186 End stage renal disease: Secondary | ICD-10-CM

## 2023-03-12 DIAGNOSIS — Z794 Long term (current) use of insulin: Secondary | ICD-10-CM

## 2023-03-12 DIAGNOSIS — R051 Acute cough: Secondary | ICD-10-CM | POA: Diagnosis not present

## 2023-03-12 DIAGNOSIS — R1314 Dysphagia, pharyngoesophageal phase: Secondary | ICD-10-CM | POA: Diagnosis present

## 2023-03-12 DIAGNOSIS — Z86010 Personal history of colonic polyps: Secondary | ICD-10-CM

## 2023-03-12 DIAGNOSIS — U071 COVID-19: Secondary | ICD-10-CM | POA: Diagnosis not present

## 2023-03-12 DIAGNOSIS — D631 Anemia in chronic kidney disease: Secondary | ICD-10-CM | POA: Diagnosis present

## 2023-03-12 DIAGNOSIS — Z992 Dependence on renal dialysis: Secondary | ICD-10-CM | POA: Diagnosis not present

## 2023-03-12 DIAGNOSIS — K219 Gastro-esophageal reflux disease without esophagitis: Secondary | ICD-10-CM | POA: Diagnosis not present

## 2023-03-12 DIAGNOSIS — I120 Hypertensive chronic kidney disease with stage 5 chronic kidney disease or end stage renal disease: Secondary | ICD-10-CM | POA: Diagnosis not present

## 2023-03-12 DIAGNOSIS — E162 Hypoglycemia, unspecified: Secondary | ICD-10-CM | POA: Diagnosis present

## 2023-03-12 DIAGNOSIS — R531 Weakness: Secondary | ICD-10-CM

## 2023-03-12 DIAGNOSIS — R1319 Other dysphagia: Secondary | ICD-10-CM | POA: Diagnosis present

## 2023-03-12 DIAGNOSIS — J989 Respiratory disorder, unspecified: Secondary | ICD-10-CM | POA: Diagnosis not present

## 2023-03-12 DIAGNOSIS — Z8701 Personal history of pneumonia (recurrent): Secondary | ICD-10-CM

## 2023-03-12 DIAGNOSIS — I469 Cardiac arrest, cause unspecified: Secondary | ICD-10-CM | POA: Diagnosis not present

## 2023-03-12 DIAGNOSIS — E1122 Type 2 diabetes mellitus with diabetic chronic kidney disease: Secondary | ICD-10-CM | POA: Diagnosis present

## 2023-03-12 DIAGNOSIS — E785 Hyperlipidemia, unspecified: Secondary | ICD-10-CM | POA: Diagnosis present

## 2023-03-12 DIAGNOSIS — J45901 Unspecified asthma with (acute) exacerbation: Secondary | ICD-10-CM | POA: Diagnosis not present

## 2023-03-12 DIAGNOSIS — I10 Essential (primary) hypertension: Secondary | ICD-10-CM | POA: Diagnosis not present

## 2023-03-12 DIAGNOSIS — G9341 Metabolic encephalopathy: Secondary | ICD-10-CM | POA: Diagnosis not present

## 2023-03-12 DIAGNOSIS — E1165 Type 2 diabetes mellitus with hyperglycemia: Secondary | ICD-10-CM | POA: Diagnosis not present

## 2023-03-12 DIAGNOSIS — E8770 Fluid overload, unspecified: Secondary | ICD-10-CM | POA: Diagnosis not present

## 2023-03-12 DIAGNOSIS — J90 Pleural effusion, not elsewhere classified: Secondary | ICD-10-CM | POA: Diagnosis not present

## 2023-03-12 DIAGNOSIS — D72829 Elevated white blood cell count, unspecified: Secondary | ICD-10-CM | POA: Diagnosis present

## 2023-03-12 DIAGNOSIS — Z7984 Long term (current) use of oral hypoglycemic drugs: Secondary | ICD-10-CM

## 2023-03-12 DIAGNOSIS — Z79899 Other long term (current) drug therapy: Secondary | ICD-10-CM

## 2023-03-12 DIAGNOSIS — E11649 Type 2 diabetes mellitus with hypoglycemia without coma: Secondary | ICD-10-CM | POA: Diagnosis not present

## 2023-03-12 DIAGNOSIS — J9811 Atelectasis: Secondary | ICD-10-CM | POA: Diagnosis present

## 2023-03-12 DIAGNOSIS — E872 Acidosis, unspecified: Secondary | ICD-10-CM | POA: Diagnosis not present

## 2023-03-12 DIAGNOSIS — D72810 Lymphocytopenia: Secondary | ICD-10-CM | POA: Diagnosis not present

## 2023-03-12 DIAGNOSIS — E8889 Other specified metabolic disorders: Secondary | ICD-10-CM | POA: Diagnosis not present

## 2023-03-12 DIAGNOSIS — Z888 Allergy status to other drugs, medicaments and biological substances status: Secondary | ICD-10-CM

## 2023-03-12 DIAGNOSIS — D696 Thrombocytopenia, unspecified: Secondary | ICD-10-CM | POA: Diagnosis present

## 2023-03-12 DIAGNOSIS — R059 Cough, unspecified: Secondary | ICD-10-CM | POA: Diagnosis not present

## 2023-03-12 DIAGNOSIS — R7989 Other specified abnormal findings of blood chemistry: Secondary | ICD-10-CM | POA: Diagnosis not present

## 2023-03-12 DIAGNOSIS — R0602 Shortness of breath: Secondary | ICD-10-CM | POA: Diagnosis not present

## 2023-03-12 DIAGNOSIS — R0603 Acute respiratory distress: Secondary | ICD-10-CM | POA: Diagnosis present

## 2023-03-12 DIAGNOSIS — Z8709 Personal history of other diseases of the respiratory system: Secondary | ICD-10-CM | POA: Diagnosis not present

## 2023-03-12 DIAGNOSIS — R54 Age-related physical debility: Secondary | ICD-10-CM | POA: Diagnosis present

## 2023-03-12 DIAGNOSIS — Z7982 Long term (current) use of aspirin: Secondary | ICD-10-CM

## 2023-03-12 DIAGNOSIS — R5383 Other fatigue: Secondary | ICD-10-CM

## 2023-03-12 DIAGNOSIS — N250 Renal osteodystrophy: Secondary | ICD-10-CM | POA: Diagnosis not present

## 2023-03-12 DIAGNOSIS — R918 Other nonspecific abnormal finding of lung field: Secondary | ICD-10-CM | POA: Diagnosis not present

## 2023-03-12 DIAGNOSIS — I6930 Unspecified sequelae of cerebral infarction: Secondary | ICD-10-CM

## 2023-03-12 LAB — BLOOD GAS, VENOUS
Acid-Base Excess: 7.4 mmol/L — ABNORMAL HIGH (ref 0.0–2.0)
Bicarbonate: 35 mmol/L — ABNORMAL HIGH (ref 20.0–28.0)
Drawn by: 4237
O2 Saturation: 19.9 %
Patient temperature: 37.3
pCO2, Ven: 63 mmHg — ABNORMAL HIGH (ref 44–60)
pH, Ven: 7.36 (ref 7.25–7.43)
pO2, Ven: <31 mmHg — CL (ref 32–45)

## 2023-03-12 LAB — COMPREHENSIVE METABOLIC PANEL
ALT: 16 U/L (ref 0–44)
AST: 29 U/L (ref 15–41)
Albumin: 3.2 g/dL — ABNORMAL LOW (ref 3.5–5.0)
Alkaline Phosphatase: 66 U/L (ref 38–126)
Anion gap: 15 (ref 5–15)
BUN: 23 mg/dL (ref 8–23)
CO2: 27 mmol/L (ref 22–32)
Calcium: 7.8 mg/dL — ABNORMAL LOW (ref 8.9–10.3)
Chloride: 94 mmol/L — ABNORMAL LOW (ref 98–111)
Creatinine, Ser: 3.7 mg/dL — ABNORMAL HIGH (ref 0.44–1.00)
GFR, Estimated: 12 mL/min — ABNORMAL LOW (ref >60)
Glucose, Bld: 221 mg/dL — ABNORMAL HIGH (ref 70–99)
Potassium: 3.9 mmol/L (ref 3.5–5.1)
Sodium: 136 mmol/L (ref 135–145)
Total Bilirubin: 1.6 mg/dL — ABNORMAL HIGH (ref 0.3–1.2)
Total Protein: 7.6 g/dL (ref 6.5–8.1)

## 2023-03-12 LAB — CBC WITH DIFFERENTIAL/PLATELET
Abs Immature Granulocytes: 0.06 10*3/uL (ref 0.00–0.07)
Basophils Absolute: 0 10*3/uL (ref 0.0–0.1)
Basophils Relative: 0 %
Eosinophils Absolute: 0 10*3/uL (ref 0.0–0.5)
Eosinophils Relative: 0 %
HCT: 36 % (ref 36.0–46.0)
Hemoglobin: 12 g/dL (ref 12.0–15.0)
Immature Granulocytes: 0 %
Lymphocytes Relative: 3 %
Lymphs Abs: 0.4 10*3/uL — ABNORMAL LOW (ref 0.7–4.0)
MCH: 31.3 pg (ref 26.0–34.0)
MCHC: 33.3 g/dL (ref 30.0–36.0)
MCV: 94 fL (ref 80.0–100.0)
Monocytes Absolute: 1.3 10*3/uL — ABNORMAL HIGH (ref 0.1–1.0)
Monocytes Relative: 9 %
Neutro Abs: 11.8 10*3/uL — ABNORMAL HIGH (ref 1.7–7.7)
Neutrophils Relative %: 88 %
Platelets: 109 10*3/uL — ABNORMAL LOW (ref 150–400)
RBC: 3.83 MIL/uL — ABNORMAL LOW (ref 3.87–5.11)
RDW: 20.4 % — ABNORMAL HIGH (ref 11.5–15.5)
WBC: 13.5 10*3/uL — ABNORMAL HIGH (ref 4.0–10.5)
nRBC: 0.4 % — ABNORMAL HIGH (ref 0.0–0.2)

## 2023-03-12 LAB — GLUCOSE, CAPILLARY
Glucose-Capillary: 154 mg/dL — ABNORMAL HIGH (ref 70–99)
Glucose-Capillary: 112 mg/dL — ABNORMAL HIGH (ref 70–99)

## 2023-03-12 LAB — SARS CORONAVIRUS 2 BY RT PCR: SARS Coronavirus 2 by RT PCR: POSITIVE — AB

## 2023-03-12 LAB — TROPONIN I (HIGH SENSITIVITY)
Troponin I (High Sensitivity): 26 ng/L — ABNORMAL HIGH (ref <18)
Troponin I (High Sensitivity): 26 ng/L — ABNORMAL HIGH (ref <18)

## 2023-03-12 LAB — PROCALCITONIN: Procalcitonin: 0.29 ng/mL

## 2023-03-12 LAB — C-REACTIVE PROTEIN: CRP: 6.2 mg/dL — ABNORMAL HIGH (ref <1.0)

## 2023-03-12 MED ORDER — METHYLPREDNISOLONE SODIUM SUCC 125 MG IJ SOLR
80.0000 mg | Freq: Once | INTRAMUSCULAR | Status: AC
  Administered 2023-03-12: 80 mg via INTRAVENOUS
  Filled 2023-03-12: qty 2

## 2023-03-12 MED ORDER — ALPRAZOLAM 0.25 MG PO TABS
0.2500 mg | ORAL_TABLET | Freq: Every day | ORAL | Status: DC
  Administered 2023-03-13: 0.25 mg via ORAL
  Filled 2023-03-12: qty 1

## 2023-03-12 MED ORDER — ALBUTEROL SULFATE (2.5 MG/3ML) 0.083% IN NEBU
5.0000 mg | INHALATION_SOLUTION | Freq: Once | RESPIRATORY_TRACT | Status: AC
  Administered 2023-03-12: 5 mg via RESPIRATORY_TRACT
  Filled 2023-03-12: qty 6

## 2023-03-12 MED ORDER — HEPARIN SODIUM (PORCINE) 5000 UNIT/ML IJ SOLN
5000.0000 [IU] | Freq: Three times a day (TID) | INTRAMUSCULAR | Status: DC
  Administered 2023-03-12 – 2023-03-14 (×6): 5000 [IU] via SUBCUTANEOUS
  Filled 2023-03-12 (×6): qty 1

## 2023-03-12 MED ORDER — HYDROCODONE BIT-HOMATROP MBR 5-1.5 MG/5ML PO SOLN
5.0000 mL | Freq: Four times a day (QID) | ORAL | Status: DC | PRN
  Administered 2023-03-12: 5 mL via ORAL
  Filled 2023-03-12: qty 5

## 2023-03-12 MED ORDER — IPRATROPIUM-ALBUTEROL 0.5-2.5 (3) MG/3ML IN SOLN
3.0000 mL | Freq: Two times a day (BID) | RESPIRATORY_TRACT | Status: DC
  Administered 2023-03-12: 3 mL via RESPIRATORY_TRACT
  Filled 2023-03-12: qty 3

## 2023-03-12 MED ORDER — IPRATROPIUM-ALBUTEROL 0.5-2.5 (3) MG/3ML IN SOLN
3.0000 mL | Freq: Two times a day (BID) | RESPIRATORY_TRACT | Status: DC
  Administered 2023-03-13: 3 mL via RESPIRATORY_TRACT
  Filled 2023-03-12: qty 3

## 2023-03-12 MED ORDER — INSULIN ASPART 100 UNIT/ML IJ SOLN
0.0000 [IU] | Freq: Three times a day (TID) | INTRAMUSCULAR | Status: DC
  Administered 2023-03-12 – 2023-03-13 (×3): 2 [IU] via SUBCUTANEOUS
  Administered 2023-03-13: 3 [IU] via SUBCUTANEOUS

## 2023-03-12 MED ORDER — ACETAMINOPHEN 325 MG PO TABS
650.0000 mg | ORAL_TABLET | Freq: Four times a day (QID) | ORAL | Status: DC | PRN
  Administered 2023-03-14: 650 mg via ORAL
  Filled 2023-03-12: qty 2

## 2023-03-12 MED ORDER — BENZONATATE 100 MG PO CAPS
200.0000 mg | ORAL_CAPSULE | Freq: Once | ORAL | Status: AC
  Administered 2023-03-12: 200 mg via ORAL
  Filled 2023-03-12: qty 2

## 2023-03-12 MED ORDER — PREDNISONE 20 MG PO TABS
40.0000 mg | ORAL_TABLET | Freq: Every day | ORAL | Status: DC
  Administered 2023-03-13 – 2023-03-14 (×2): 40 mg via ORAL
  Filled 2023-03-12 (×2): qty 2

## 2023-03-12 MED ORDER — ACETAMINOPHEN 650 MG RE SUPP: 650.0000 mg | Freq: Four times a day (QID) | RECTAL | Status: DC | PRN

## 2023-03-12 MED ORDER — ASPIRIN 81 MG PO TBEC
81.0000 mg | DELAYED_RELEASE_TABLET | Freq: Every day | ORAL | Status: DC
  Administered 2023-03-13 – 2023-03-14 (×2): 81 mg via ORAL
  Filled 2023-03-12 (×2): qty 1

## 2023-03-12 MED ORDER — ALBUTEROL SULFATE (2.5 MG/3ML) 0.083% IN NEBU
2.5000 mg | INHALATION_SOLUTION | RESPIRATORY_TRACT | Status: DC | PRN
  Administered 2023-03-14: 2.5 mg via RESPIRATORY_TRACT
  Filled 2023-03-12: qty 3

## 2023-03-12 MED ORDER — SODIUM CHLORIDE 0.9% FLUSH
3.0000 mL | Freq: Two times a day (BID) | INTRAVENOUS | Status: DC
  Administered 2023-03-12 – 2023-03-13 (×3): 3 mL via INTRAVENOUS

## 2023-03-12 MED ORDER — INSULIN ASPART 100 UNIT/ML IJ SOLN: 0.0000 [IU] | Freq: Every day | INTRAMUSCULAR | Status: DC

## 2023-03-12 MED ORDER — DM-GUAIFENESIN ER 30-600 MG PO TB12
1.0000 | ORAL_TABLET | Freq: Two times a day (BID) | ORAL | Status: DC
  Administered 2023-03-12 – 2023-03-13 (×3): 1 via ORAL
  Filled 2023-03-12 (×3): qty 1

## 2023-03-12 MED ORDER — RENA-VITE PO TABS
1.0000 | ORAL_TABLET | Freq: Every day | ORAL | Status: DC
  Administered 2023-03-12 – 2023-03-13 (×2): 1 via ORAL
  Filled 2023-03-12 (×2): qty 1

## 2023-03-12 MED ORDER — PANTOPRAZOLE SODIUM 40 MG PO TBEC: 40.0000 mg | DELAYED_RELEASE_TABLET | Freq: Every day | ORAL | Status: DC

## 2023-03-12 MED ORDER — LINAGLIPTIN 5 MG PO TABS
5.0000 mg | ORAL_TABLET | Freq: Every day | ORAL | Status: DC
  Administered 2023-03-13 – 2023-03-14 (×2): 5 mg via ORAL
  Filled 2023-03-12 (×2): qty 1

## 2023-03-12 MED ORDER — PANTOPRAZOLE SODIUM 40 MG PO TBEC
40.0000 mg | DELAYED_RELEASE_TABLET | Freq: Every day | ORAL | Status: DC
  Administered 2023-03-13 – 2023-03-14 (×2): 40 mg via ORAL
  Filled 2023-03-12 (×2): qty 1

## 2023-03-12 MED ORDER — LABETALOL HCL 200 MG PO TABS
200.0000 mg | ORAL_TABLET | Freq: Two times a day (BID) | ORAL | Status: DC
  Administered 2023-03-12: 200 mg via ORAL
  Filled 2023-03-12 (×3): qty 1

## 2023-03-12 NOTE — ED Provider Notes (Signed)
by physician 03/03/23   Barbara Fleet, MD  Insulin Pen Needle 31G X 5 MM MISC 1 Device by Does not apply route as directed. 03/03/23   Johnson, Clanford L, MD  labetalol (NORMODYNE) 200 MG tablet Take 200 mg by mouth 2 (two) times daily. 01/06/23   [provider]  linagliptin (TRADJENTA) 5 MG TABS tablet Take 1 tablet (5 mg total) by mouth daily. 03/03/23   Johnson, Clanford L, MD  multivitamin (RENA-VIT) TABS tablet Take 1 tablet by mouth at bedtime. 08/21/14   Kirsteins, Victorino Sparrow, MD  omeprazole (PRILOSEC) 40 MG capsule TAKE ONE CAPSULE BY MOUTH TWICE DAILY BEFORE A meal Patient taking differently: Take 40 mg by mouth in the morning and at bedtime. 04/16/22   Mansouraty, Netty Starring., MD    Family History Family History  Problem Relation Age of Onset   Cancer Sister    Cancer Brother    Anesthesia problems Neg Hx    Hypotension Neg Hx    Malignant hyperthermia Neg Hx    Pseudochol deficiency Neg Hx     Social History Social History   Tobacco Use   Smoking status: Never    Passive exposure: Never   Smokeless tobacco: Never  Vaping Use   Vaping status: Never  Used  Substance Use Topics   Alcohol use: No   Drug use: No     Allergies   Pregabalin, Ambien [zolpidem], and Reglan [metoclopramide]   Review of Systems Review of Systems Per HPI  Physical Exam Triage Vital Signs ED Triage Vitals [03/13/2023 1020]  Encounter Vitals Group     BP 103/68     Systolic BP Percentile      Diastolic BP Percentile      Pulse Rate 86     Resp (!) 22     Temp 98.3 F (36.8 C)     Temp Source Oral     SpO2 94 %     Weight      Height      Head Circumference      Peak Flow      Pain Score 8     Pain Loc      Pain Education      Exclude from Growth Chart    No data found.  Updated Vital Signs BP 103/68 (BP Location: Right Arm)   Pulse 86   Temp 98.3 F (36.8 C) (Oral)   Resp (!) 22   SpO2 94%   Visual Acuity Right Eye Distance:   Left Eye Distance:   Bilateral Distance:    Right Eye Near:   Left Eye Near:    Bilateral Near:     Physical Exam Vitals and nursing note reviewed.  Constitutional:      Appearance: Normal appearance. She is not ill-appearing.  HENT:     Head: Atraumatic.     Mouth/Throat:     Mouth: Mucous membranes are moist.  Eyes:     Extraocular Movements: Extraocular movements intact.     Conjunctiva/sclera: Conjunctivae normal.  Cardiovascular:     Rate and Rhythm: Normal rate.  Pulmonary:     Comments: Patient tripoding throughout exam, moaning with breaths, tachypneic.  Breath sounds decreased throughout, crackles at bases bilaterally Musculoskeletal:     Cervical back: Normal range of motion and neck supple.     Comments: Currently in wheelchair due to severe weakness, lethargy.  1+ edema bilateral lower extremities  Skin:    General: Skin is warm and dry.  by physician 03/03/23   Barbara Fleet, MD  Insulin Pen Needle 31G X 5 MM MISC 1 Device by Does not apply route as directed. 03/03/23   Johnson, Clanford L, MD  labetalol (NORMODYNE) 200 MG tablet Take 200 mg by mouth 2 (two) times daily. 01/06/23   [provider]  linagliptin (TRADJENTA) 5 MG TABS tablet Take 1 tablet (5 mg total) by mouth daily. 03/03/23   Johnson, Clanford L, MD  multivitamin (RENA-VIT) TABS tablet Take 1 tablet by mouth at bedtime. 08/21/14   Kirsteins, Victorino Sparrow, MD  omeprazole (PRILOSEC) 40 MG capsule TAKE ONE CAPSULE BY MOUTH TWICE DAILY BEFORE A meal Patient taking differently: Take 40 mg by mouth in the morning and at bedtime. 04/16/22   Mansouraty, Netty Starring., MD    Family History Family History  Problem Relation Age of Onset   Cancer Sister    Cancer Brother    Anesthesia problems Neg Hx    Hypotension Neg Hx    Malignant hyperthermia Neg Hx    Pseudochol deficiency Neg Hx     Social History Social History   Tobacco Use   Smoking status: Never    Passive exposure: Never   Smokeless tobacco: Never  Vaping Use   Vaping status: Never  Used  Substance Use Topics   Alcohol use: No   Drug use: No     Allergies   Pregabalin, Ambien [zolpidem], and Reglan [metoclopramide]   Review of Systems Review of Systems Per HPI  Physical Exam Triage Vital Signs ED Triage Vitals [03/13/2023 1020]  Encounter Vitals Group     BP 103/68     Systolic BP Percentile      Diastolic BP Percentile      Pulse Rate 86     Resp (!) 22     Temp 98.3 F (36.8 C)     Temp Source Oral     SpO2 94 %     Weight      Height      Head Circumference      Peak Flow      Pain Score 8     Pain Loc      Pain Education      Exclude from Growth Chart    No data found.  Updated Vital Signs BP 103/68 (BP Location: Right Arm)   Pulse 86   Temp 98.3 F (36.8 C) (Oral)   Resp (!) 22   SpO2 94%   Visual Acuity Right Eye Distance:   Left Eye Distance:   Bilateral Distance:    Right Eye Near:   Left Eye Near:    Bilateral Near:     Physical Exam Vitals and nursing note reviewed.  Constitutional:      Appearance: Normal appearance. She is not ill-appearing.  HENT:     Head: Atraumatic.     Mouth/Throat:     Mouth: Mucous membranes are moist.  Eyes:     Extraocular Movements: Extraocular movements intact.     Conjunctiva/sclera: Conjunctivae normal.  Cardiovascular:     Rate and Rhythm: Normal rate.  Pulmonary:     Comments: Patient tripoding throughout exam, moaning with breaths, tachypneic.  Breath sounds decreased throughout, crackles at bases bilaterally Musculoskeletal:     Cervical back: Normal range of motion and neck supple.     Comments: Currently in wheelchair due to severe weakness, lethargy.  1+ edema bilateral lower extremities  Skin:    General: Skin is warm and dry.  by physician 03/03/23   Barbara Fleet, MD  Insulin Pen Needle 31G X 5 MM MISC 1 Device by Does not apply route as directed. 03/03/23   Johnson, Clanford L, MD  labetalol (NORMODYNE) 200 MG tablet Take 200 mg by mouth 2 (two) times daily. 01/06/23   [provider]  linagliptin (TRADJENTA) 5 MG TABS tablet Take 1 tablet (5 mg total) by mouth daily. 03/03/23   Johnson, Clanford L, MD  multivitamin (RENA-VIT) TABS tablet Take 1 tablet by mouth at bedtime. 08/21/14   Kirsteins, Victorino Sparrow, MD  omeprazole (PRILOSEC) 40 MG capsule TAKE ONE CAPSULE BY MOUTH TWICE DAILY BEFORE A meal Patient taking differently: Take 40 mg by mouth in the morning and at bedtime. 04/16/22   Mansouraty, Netty Starring., MD    Family History Family History  Problem Relation Age of Onset   Cancer Sister    Cancer Brother    Anesthesia problems Neg Hx    Hypotension Neg Hx    Malignant hyperthermia Neg Hx    Pseudochol deficiency Neg Hx     Social History Social History   Tobacco Use   Smoking status: Never    Passive exposure: Never   Smokeless tobacco: Never  Vaping Use   Vaping status: Never  Used  Substance Use Topics   Alcohol use: No   Drug use: No     Allergies   Pregabalin, Ambien [zolpidem], and Reglan [metoclopramide]   Review of Systems Review of Systems Per HPI  Physical Exam Triage Vital Signs ED Triage Vitals [03/13/2023 1020]  Encounter Vitals Group     BP 103/68     Systolic BP Percentile      Diastolic BP Percentile      Pulse Rate 86     Resp (!) 22     Temp 98.3 F (36.8 C)     Temp Source Oral     SpO2 94 %     Weight      Height      Head Circumference      Peak Flow      Pain Score 8     Pain Loc      Pain Education      Exclude from Growth Chart    No data found.  Updated Vital Signs BP 103/68 (BP Location: Right Arm)   Pulse 86   Temp 98.3 F (36.8 C) (Oral)   Resp (!) 22   SpO2 94%   Visual Acuity Right Eye Distance:   Left Eye Distance:   Bilateral Distance:    Right Eye Near:   Left Eye Near:    Bilateral Near:     Physical Exam Vitals and nursing note reviewed.  Constitutional:      Appearance: Normal appearance. She is not ill-appearing.  HENT:     Head: Atraumatic.     Mouth/Throat:     Mouth: Mucous membranes are moist.  Eyes:     Extraocular Movements: Extraocular movements intact.     Conjunctiva/sclera: Conjunctivae normal.  Cardiovascular:     Rate and Rhythm: Normal rate.  Pulmonary:     Comments: Patient tripoding throughout exam, moaning with breaths, tachypneic.  Breath sounds decreased throughout, crackles at bases bilaterally Musculoskeletal:     Cervical back: Normal range of motion and neck supple.     Comments: Currently in wheelchair due to severe weakness, lethargy.  1+ edema bilateral lower extremities  Skin:    General: Skin is warm and dry.  [facility]    CSN: [id number] Arrival date & time: 02/15/2023  0909      History   Chief Complaint Chief Complaint  Patient presents with   Cough    HPI [name] is a 82 y.o. female.   Patient presenting today with daughter who provides all of the history today.  Daughter states that she has had a nonproductive cough, shortness of breath worsening over the last 4 to 5 days.  She has become increasingly more lethargic, weak over time.  They have not noticed any fever, sore throat, nausea, vomiting, abdominal pain, diarrhea.  She has a complicated past medical history to include end-stage renal disease on dialysis, history of CVA, history of acute respiratory failure, history of pneumonia, history of pleural effusions.  Daughter does note that her blood pressure dropped too low at dialysis on Wednesday so they were unable to pull enough fluid off of her.  She was supposed to start an oral medication to increase her blood pressure yesterday but when they went to the pharmacy to pick it up it was not there so she has not yet been able to start on this medication.  So far not trying anything over-the-counter for symptoms.  Had an albuterol inhaler but ran out so using a family members inhaler.    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   Dialysis patient (HCC)    M, W, F   Fistula    R arm   GERD (gastroesophageal reflux disease)    Hypertension    Renal disorder    Shortness of breath    Stroke [facility])    right side weakness    Patient Active Problem List   Diagnosis Date Noted   Acute metabolic encephalopathy 03/02/2023   Altered mental state 02/28/2023   Generalized weakness 02/27/2023   Elevated ferritin 01/29/2023   Rectal bleed 01/09/2023   Acute blood loss anemia 06/22/2022   Hemothorax 06/22/2022    Hemothorax on left 06/21/2022   Loculated pleural effusion 06/21/2022   Need for management of chest tube 06/20/2022   Delirium 06/14/2022   GERD (gastroesophageal reflux disease) 06/10/2022   CAP (community acquired pneumonia) 06/10/2022   ESRD (end stage renal disease) (HCC) 06/09/2022   DMII (diabetes mellitus, type 2) (HCC) 06/09/2022   Multifocal pneumonia 06/02/2022   Pleural effusion 06/02/2022   End stage renal disease on dialysis (HCC) 06/02/2022   Spinal stenosis of lumbar region 01/07/2022   Schatzki's ring 10/15/2021   Esophageal dysphagia 10/15/2021   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   Hypertensive heart and CKD, 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/2020   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 due to type 2 diabetes mellitus (HCC) 11/09/2020   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 diabetes mellitus (HCC) 11/09/2020   Dependence on renal dialysis (HCC) 11/09/2020   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 disease (HCC) 11/09/2020   Hyperlipidemia associated with type 2 diabetes mellitus (HCC) 11/09/2020   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 female.   Patient presenting today with daughter who provides all of the history today.  Daughter states that she has had a nonproductive cough, shortness of breath worsening over the last 4 to 5 days.  She has become increasingly more lethargic, weak over time.  They have not noticed any fever, sore throat, nausea, vomiting, abdominal pain, diarrhea.  She has a complicated past medical history to include end-stage renal disease on dialysis, history of CVA, history of acute respiratory failure, history of pneumonia, history of pleural effusions.  Daughter does note that her blood pressure dropped too low at dialysis on Wednesday so they were unable to pull enough fluid off of her.  She was supposed to start an oral medication to increase her blood pressure yesterday but when they went to the pharmacy to pick it up it was not there so she has not yet been able to start on this medication.  So far not trying anything over-the-counter for symptoms.  Had an albuterol inhaler but ran out so using a family members inhaler.    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   Dialysis patient (HCC)    M, W, F   Fistula    R arm   GERD (gastroesophageal reflux disease)    Hypertension    Renal disorder    Shortness of breath    Stroke [facility])    right side weakness    Patient Active Problem List   Diagnosis Date Noted   Acute metabolic encephalopathy 03/02/2023   Altered mental state 02/28/2023   Generalized weakness 02/27/2023   Elevated ferritin 01/29/2023   Rectal bleed 01/09/2023   Acute blood loss anemia 06/22/2022   Hemothorax 06/22/2022    Hemothorax on left 06/21/2022   Loculated pleural effusion 06/21/2022   Need for management of chest tube 06/20/2022   Delirium 06/14/2022   GERD (gastroesophageal reflux disease) 06/10/2022   CAP (community acquired pneumonia) 06/10/2022   ESRD (end stage renal disease) (HCC) 06/09/2022   DMII (diabetes mellitus, type 2) (HCC) 06/09/2022   Multifocal pneumonia 06/02/2022   Pleural effusion 06/02/2022   End stage renal disease on dialysis (HCC) 06/02/2022   Spinal stenosis of lumbar region 01/07/2022   Schatzki's ring 10/15/2021   Esophageal dysphagia 10/15/2021   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   Hypertensive heart and CKD, 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/2020   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 due to type 2 diabetes mellitus (HCC) 11/09/2020   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 diabetes mellitus (HCC) 11/09/2020   Dependence on renal dialysis (HCC) 11/09/2020   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 disease (HCC) 11/09/2020   Hyperlipidemia associated with type 2 diabetes mellitus (HCC) 11/09/2020   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

## 2023-03-12 NOTE — ED Notes (Signed)
pt is coughing and is wheezing as well. I sat her up, the O2 is still 95%. Family says dialysis place wont do dialysis due to fluid and low BP.  EDP notified

## 2023-03-12 NOTE — ED Provider Notes (Signed)
[place] EMERGENCY DEPARTMENT AT [facility] Provider Note   CSN: [id number] Arrival date & time: 02/28/2023  1050     History  Chief Complaint  Patient presents with   Shortness of Breath    [name] is a 82 y.o. female.  HPI    82 year old patient with history of ESRD on hemodialysis, stroke, dysphagia, diabetes comes to the emergency room with chief complaint of shortness of breath, cough.  Patient was just discharged from the hospital last week, she was admitted for generalized weakness.  According to daughter, patient has history of aspiration pneumonia.  Patient has been having some cough and shortness of breath the last few days.  She had seen her PCP after being discharged, and there was concerns that patient was having volume overload.  The plan was for patient to get dialyzed with extra fluid removed.  However when they went to the dialysis yesterday, they noted that patient was becoming hypotensive with attempts at additional fluid removal.  Patient's PCP had then mentioned that they will prescribe milrinone and reattempt at extra fluid drainage at the next dialysis however patient started having increasing shortness of breath and cough last night.  Patient has also been having more weakness.  Home Medications Prior to Admission medications   Medication Sig Start Date End Date Taking? Authorizing Provider  acetaminophen (TYLENOL) 500 MG tablet Take 500 mg by mouth at bedtime.   Yes [provider]  albuterol (VENTOLIN HFA) 108 (90 Base) MCG/ACT inhaler Inhale 2 puffs into the lungs every 6 (six) hours as needed for wheezing or shortness of breath. 11/21/20  Yes Sharee Holster, NP  ALPRAZolam Prudy Feeler) 0.25 MG tablet Take 0.25 mg by mouth daily. 03/10/23  Yes [provider]  aspirin EC 81 MG tablet Take 1 tablet (81 mg total) by mouth daily. Swallow whole. 01/10/23 01/10/24 Yes Shah, Pratik D, DO  cyanocobalamin (,VITAMIN B-12,) 1000 MCG/ML  injection Inject 1,000 mcg into the muscle every 30 (thirty) days. 11/01/14  Yes [provider]  insulin glargine (LANTUS) 100 UNIT/ML Solostar Pen Inject 5 Units into the skin daily. Or as directed by physician 03/03/23  Yes Johnson, Clanford L, MD  labetalol (NORMODYNE) 200 MG tablet Take 200 mg by mouth 2 (two) times daily. 01/06/23  Yes [provider]  linagliptin (TRADJENTA) 5 MG TABS tablet Take 1 tablet (5 mg total) by mouth daily. 03/03/23  Yes Johnson, Clanford L, MD  multivitamin (RENA-VIT) TABS tablet Take 1 tablet by mouth at bedtime. 08/21/14  Yes Kirsteins, Victorino Sparrow, MD  omeprazole (PRILOSEC) 40 MG capsule TAKE ONE CAPSULE BY MOUTH TWICE DAILY BEFORE A meal Patient taking differently: Take 40 mg by mouth in the morning and at bedtime. 04/16/22  Yes Mansouraty, Netty Starring., MD  Insulin Pen Needle 31G X 5 MM MISC 1 Device by Does not apply route as directed. 03/03/23   Cleora Fleet, MD      Allergies    Pregabalin, Ambien [zolpidem], and Reglan [metoclopramide]    Review of Systems   Review of Systems  All other systems reviewed and are negative.   Physical Exam Updated Vital Signs BP (!) 120/57 (BP Location: Left Arm)   Pulse 90   Temp 98.1 F (36.7 C) (Oral)   Resp (!) 26   Ht 5\' 7"  (1.702 m)   Wt 75.3 kg   SpO2 100%   BMI 26.00 kg/m  Physical Exam Vitals and nursing note reviewed.  Constitutional:  Appearance: She is well-developed.  HENT:     Head: Atraumatic.  Cardiovascular:     Rate and Rhythm: Normal rate.  Pulmonary:     Effort: Pulmonary effort is normal.  Musculoskeletal:     Cervical back: Normal range of motion and neck supple.  Skin:    General: Skin is warm and dry.  Neurological:     Mental Status: She is alert and oriented to person, place, and time.     ED Results / Procedures / Treatments   Labs (all labs ordered are listed, but only abnormal results are displayed) Labs Reviewed  SARS CORONAVIRUS 2 BY RT PCR -  Abnormal; Notable for the following components:      Result Value   SARS Coronavirus 2 by RT PCR POSITIVE (*)    All other components within normal limits  CBC WITH DIFFERENTIAL/PLATELET - Abnormal; Notable for the following components:   WBC 13.5 (*)    RBC 3.83 (*)    RDW 20.4 (*)    Platelets 109 (*)    nRBC 0.4 (*)    Neutro Abs 11.8 (*)    Lymphs Abs 0.4 (*)    Monocytes Absolute 1.3 (*)    All other components within normal limits  COMPREHENSIVE METABOLIC PANEL - Abnormal; Notable for the following components:   Chloride 94 (*)    Glucose, Bld 221 (*)    Creatinine, Ser 3.70 (*)    Calcium 7.8 (*)    Albumin 3.2 (*)    Total Bilirubin 1.6 (*)    GFR, Estimated 12 (*)    All other components within normal limits  BLOOD GAS, VENOUS - Abnormal; Notable for the following components:   pCO2, Ven 63 (*)    pO2, Ven <31 (*)    Bicarbonate 35.0 (*)    Acid-Base Excess 7.4 (*)    All other components within normal limits  GLUCOSE, CAPILLARY - Abnormal; Notable for the following components:   Glucose-Capillary 154 (*)    All other components within normal limits  TROPONIN I (HIGH SENSITIVITY) - Abnormal; Notable for the following components:   Troponin I (High Sensitivity) 26 (*)    All other components within normal limits  TROPONIN I (HIGH SENSITIVITY) - Abnormal; Notable for the following components:   Troponin I (High Sensitivity) 26 (*)    All other components within normal limits  PROCALCITONIN  C-REACTIVE PROTEIN  BASIC METABOLIC PANEL  CBC    EKG EKG Interpretation Date/Time:  Thursday March 12 2023 11:39:50 EDT Ventricular Rate:  87 PR Interval:  152 QRS Duration:  93 QT Interval:  387 QTC Calculation: 466 R Axis:   91  Text Interpretation: Sinus rhythm Multiform ventricular premature complexes Right axis deviation Low voltage, extremity and precordial leads Nonspecific T abnormalities, lateral leads No acute changes Confirmed by Derwood Kaplan ([id number])  on 03/15/2023 12:21:03 PM  Radiology DG Chest Portable 1 View  Result Date: 03/02/2023 CLINICAL DATA:  Shortness of breath.  Nonproductive cough. EXAM: PORTABLE CHEST 1 VIEW COMPARISON:  Chest radiograph 02/27/2023. FINDINGS: Interval resolution of small bilateral pleural effusions. No consolidation or pulmonary edema. Slightly increased peripheral opacity in the left lung, likely atelectasis. No pneumothorax. Stable cardiac and mediastinal contours. IMPRESSION: Interval resolution of small bilateral pleural effusions. Slightly increased peripheral opacity in the left lung, likely atelectasis. Electronically Signed   By: Orvan Falconer M.D.   On: 03/11/2023 14:21    Procedures Procedures    Medications Ordered in ED Medications  dextromethorphan-guaiFENesin (  MUCINEX DM) 30-600 MG per 12 hr tablet 1 tablet (1 tablet Oral Given 03-19-23 1739)  HYDROcodone bit-homatropine (HYCODAN) 5-1.5 MG/5ML syrup 5 mL (has no administration in time range)  ipratropium-albuterol (DUONEB) 0.5-2.5 (3) MG/3ML nebulizer solution 3 mL (3 mLs Nebulization Not Given Mar 19, 2023 1544)  albuterol (PROVENTIL) (2.5 MG/3ML) 0.083% nebulizer solution 2.5 mg (has no administration in time range)  predniSONE (DELTASONE) tablet 40 mg (has no administration in time range)  aspirin EC tablet 81 mg (has no administration in time range)  ALPRAZolam (XANAX) tablet 0.25 mg (has no administration in time range)  labetalol (NORMODYNE) tablet 200 mg (has no administration in time range)  linagliptin (TRADJENTA) tablet 5 mg (has no administration in time range)  multivitamin (RENA-VIT) tablet 1 tablet (has no administration in time range)  insulin aspart (novoLOG) injection 0-9 Units (2 Units Subcutaneous Given 03-19-2023 1739)  insulin aspart (novoLOG) injection 0-5 Units (has no administration in time range)  heparin injection 5,000 Units (5,000 Units Subcutaneous Given 03-19-2023 1739)  sodium chloride flush (NS) 0.9 % injection 3 mL (has  no administration in time range)  acetaminophen (TYLENOL) tablet 650 mg (has no administration in time range)    Or  acetaminophen (TYLENOL) suppository 650 mg (has no administration in time range)  pantoprazole (PROTONIX) EC tablet 40 mg (has no administration in time range)  albuterol (PROVENTIL) (2.5 MG/3ML) 0.083% nebulizer solution 5 mg (5 mg Nebulization Given March 19, 2023 1420)  benzonatate (TESSALON) capsule 200 mg (200 mg Oral Given March 19, 2023 1404)  methylPREDNISolone sodium succinate (SOLU-MEDROL) 125 mg/2 mL injection 80 mg (80 mg Intravenous Given 03/19/2023 1812)    ED Course/ Medical Decision Making/ A&P                                 Medical Decision Making Amount and/or Complexity of Data Reviewed Labs: ordered. Radiology: ordered.  Risk OTC drugs. Prescription drug management. Decision regarding hospitalization.  82 y.o. female with medical history of asthma, IDT2DM, ESRD (HD MWF), HTN, CVA w/residual deficits/dysphagia, GERD, and right foot first ray amputation who was recently admitted 9/13 - 9/17 for generalized weakness and presented to the ED by way of urgent care today due to shortness of breath and cough.   Collateral history provided by patient's daughter.  I have also reviewed patient's recent discharge summary for additional pertinent history.  It appears that patient has had increasing cough and shortness of breath the last few days and the PCP and the patient's family were attributing that to volume overload.  Differential diagnosis for this patient that was considered includes pulmonary edema, pleural effusion, acute coronary syndrome, aspiration pneumonia, community-acquired pneumonia and COVID-19.  Appropriate labs ordered, chest x-ray ordered.  I independently interpreted patient's chest x-ray, there does appear to be slight increase haziness, but no volume overload appreciated.  Patient did have some wheezing on the exam, she has received albuterol  treatment.  Her labs indicate normal white count, but positive COVID-19.  Patient has been tachypneic in the ER.  Results of the ED workup discussed with the patient and the daughter.  Daughter not comfortable taking patient home given her persistent cough, dysphagia with high risk for aspiration pneumonia and increased work of breathing and exertional shortness of breath.  Patient is indeed tachypneic during our evaluation. Plan is to request admission at this time.  Final Clinical Impression(s) / ED Diagnoses Final diagnoses:  COVID-19  Respiration disorder  Tachypnea  Rx / DC Orders ED Discharge Orders     None         Derwood Kaplan, MD 03/13/2023 1836

## 2023-03-12 NOTE — ED Notes (Signed)
Patient is being discharged from the Urgent Care and sent to the Emergency Department via POV . Per PA, patient is in need of higher level of care due to shortness of breath/end stage renal disease/hypotension. Patient is aware and verbalizes understanding of plan of care.  Vitals:   03/09/2023 1020  BP: 103/68  Pulse: 86  Resp: (!) 22  Temp: 98.3 F (36.8 C)  SpO2: 94%

## 2023-03-12 NOTE — ED Triage Notes (Signed)
Pt family reports non-productive cough for last several days. Pt family reports pt is "running low on inhaler." Inquiring about possible pneumonia.

## 2023-03-12 NOTE — ED Triage Notes (Signed)
Dialysis pt M-W-F, presents with SOB and cough, per daughter, pt isn't able to have full session of dialysis, d/t B/P dropping.

## 2023-03-12 NOTE — H&P (Signed)
History and Physical    Patient: [name]:[id number] DOB: Jun 03, 1941 DOA: 02/26/2023 DOS: the patient was seen and examined on 03/06/2023 PCP: Mirna Mires, MD  Patient coming from: Home  Chief Complaint:  Chief Complaint  Patient presents with   Shortness of Breath   HPI: [name] is a 82 y.o. female with medical history of asthma, IDT2DM, ESRD (HD MWF), HTN, CVA w/residual deficits/dysphagia, GERD, and right foot first ray amputation who was recently admitted 9/13 - 9/17 for generalized weakness and presented to the ED by way of urgent care today due to shortness of breath and cough. Her daughter supplements the patient's history. For the past 2 days she's developed a persistent, nonproductive cough and increasing fatigue, becoming weak again at home, also some wheezing for which daughter gave her albuterol inhaler. She's had no fevers, chills, chest pain. Her PCP thought dyspnea could be related to pulmonary edema, so suggested increase in UF with HD yesterday, though was limited by hypotension. Plan was to try midodrine prior to HD tomorrow, though symptoms worsened enough to cause her daughter to seek care at [facility] today. There she was tachypneic and lethargic, sent to ED.   Here she was afebrile, saturating well on room air but tachypneic with very frequent nonproductive cough. Work up included positive SARS-CoV-2 PCR, CXR showed no pulmonary edema with resolution of previous pleural effusions and very faint peripheral left base opacity most consistent with atelectasis. WBC 13.5k w/neutrophilia and lymphopenia. Normal pH by VBG. Patient's daughter requested admission due to concern for the patient's weakness and possible further deterioration/shortness of breath at home.   Review of Systems: As mentioned in the history of present illness. All other systems reviewed and are negative. Past Medical History:  Diagnosis Date   Anemia    Cataract    Chronic kidney disease     CVA (cerebral infarction)    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   Dialysis patient (HCC)    M, W, F   Fistula    R arm   GERD (gastroesophageal reflux disease)    Hypertension    Renal disorder    Shortness of breath    Stroke [facility])    right side weakness   Past Surgical History:  Procedure Laterality Date   AMPUTATION Right 01/13/2020   Procedure: AMPUTATION RIGHT FOOT 1ST RAY;  Surgeon: Nadara Mustard, MD;  Location: [facility] OR;  Service: Orthopedics;  Laterality: Right;   AV FISTULA PLACEMENT Right 09/08/2013   Procedure: CREATION OF RIGHT BRACHIAL CEPHALIC ARTERIOVENOUS FISTULA ;  Surgeon: Pryor Ochoa, MD;  Location: [facility] OR;  Service: Vascular;  Laterality: Right;   BASCILIC VEIN TRANSPOSITION Right 01/26/2014   Procedure: Right Arm BASILIC VEIN TRANSPOSITION;  Surgeon: Pryor Ochoa, MD;  Location: [facility] OR;  Service: Vascular;  Laterality: Right;   BIOPSY  09/10/2021   Procedure: BIOPSY;  Surgeon: Dolores Frame, MD;  Location: [facility];  Service: Gastroenterology;;  gastric   BIOPSY  10/07/2021   Procedure: BIOPSY;  Surgeon: Lemar Lofty., MD;  Location: [facility];  Service: Gastroenterology;;   BIOPSY  04/17/2022   Procedure: BIOPSY;  Surgeon: Lemar Lofty., MD;  Location: [facility];  Service: Gastroenterology;;   BIOPSY  02/03/2023   Procedure: BIOPSY;  Surgeon: Dolores Frame, MD;  Location: [facility];  Service: Gastroenterology;;   CATARACT EXTRACTION W/PHACO  11/20/2011   Procedure: CATARACT EXTRACTION PHACO AND  History and Physical    Patient: [name]:[id number] DOB: Jun 03, 1941 DOA: 02/26/2023 DOS: the patient was seen and examined on 03/06/2023 PCP: Mirna Mires, MD  Patient coming from: Home  Chief Complaint:  Chief Complaint  Patient presents with   Shortness of Breath   HPI: [name] is a 82 y.o. female with medical history of asthma, IDT2DM, ESRD (HD MWF), HTN, CVA w/residual deficits/dysphagia, GERD, and right foot first ray amputation who was recently admitted 9/13 - 9/17 for generalized weakness and presented to the ED by way of urgent care today due to shortness of breath and cough. Her daughter supplements the patient's history. For the past 2 days she's developed a persistent, nonproductive cough and increasing fatigue, becoming weak again at home, also some wheezing for which daughter gave her albuterol inhaler. She's had no fevers, chills, chest pain. Her PCP thought dyspnea could be related to pulmonary edema, so suggested increase in UF with HD yesterday, though was limited by hypotension. Plan was to try midodrine prior to HD tomorrow, though symptoms worsened enough to cause her daughter to seek care at [facility] today. There she was tachypneic and lethargic, sent to ED.   Here she was afebrile, saturating well on room air but tachypneic with very frequent nonproductive cough. Work up included positive SARS-CoV-2 PCR, CXR showed no pulmonary edema with resolution of previous pleural effusions and very faint peripheral left base opacity most consistent with atelectasis. WBC 13.5k w/neutrophilia and lymphopenia. Normal pH by VBG. Patient's daughter requested admission due to concern for the patient's weakness and possible further deterioration/shortness of breath at home.   Review of Systems: As mentioned in the history of present illness. All other systems reviewed and are negative. Past Medical History:  Diagnosis Date   Anemia    Cataract    Chronic kidney disease     CVA (cerebral infarction)    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   Dialysis patient (HCC)    M, W, F   Fistula    R arm   GERD (gastroesophageal reflux disease)    Hypertension    Renal disorder    Shortness of breath    Stroke [facility])    right side weakness   Past Surgical History:  Procedure Laterality Date   AMPUTATION Right 01/13/2020   Procedure: AMPUTATION RIGHT FOOT 1ST RAY;  Surgeon: Nadara Mustard, MD;  Location: [facility] OR;  Service: Orthopedics;  Laterality: Right;   AV FISTULA PLACEMENT Right 09/08/2013   Procedure: CREATION OF RIGHT BRACHIAL CEPHALIC ARTERIOVENOUS FISTULA ;  Surgeon: Pryor Ochoa, MD;  Location: [facility] OR;  Service: Vascular;  Laterality: Right;   BASCILIC VEIN TRANSPOSITION Right 01/26/2014   Procedure: Right Arm BASILIC VEIN TRANSPOSITION;  Surgeon: Pryor Ochoa, MD;  Location: [facility] OR;  Service: Vascular;  Laterality: Right;   BIOPSY  09/10/2021   Procedure: BIOPSY;  Surgeon: Dolores Frame, MD;  [facility];  Service: Gastroenterology;;  gastric   BIOPSY  10/07/2021   Procedure: BIOPSY;  Surgeon: Lemar Lofty., MD;  Location: [facility];  Service: Gastroenterology;;   BIOPSY  04/17/2022   Procedure: BIOPSY;  Surgeon: Lemar Lofty., MD;  Location: [facility];  Service: Gastroenterology;;   BIOPSY  02/03/2023   Procedure: BIOPSY;  Surgeon: Dolores Frame, MD;  [facility];  Service: Gastroenterology;;   CATARACT EXTRACTION W/PHACO  11/20/2011   Procedure: CATARACT EXTRACTION PHACO AND  History and Physical    Patient: [name]:[id number] DOB: Jun 03, 1941 DOA: 03/04/2023 DOS: the patient was seen and examined on 03/04/2023 PCP: Mirna Mires, MD  Patient coming from: Home  Chief Complaint:  Chief Complaint  Patient presents with   Shortness of Breath   HPI: [name] is a 82 y.o. female with medical history of asthma, IDT2DM, ESRD (HD MWF), HTN, CVA w/residual deficits/dysphagia, GERD, and right foot first ray amputation who was recently admitted 9/13 - 9/17 for generalized weakness and presented to the ED by way of urgent care today due to shortness of breath and cough. Her daughter supplements the patient's history. For the past 2 days she's developed a persistent, nonproductive cough and increasing fatigue, becoming weak again at home, also some wheezing for which daughter gave her albuterol inhaler. She's had no fevers, chills, chest pain. Her PCP thought dyspnea could be related to pulmonary edema, so suggested increase in UF with HD yesterday, though was limited by hypotension. Plan was to try midodrine prior to HD tomorrow, though symptoms worsened enough to cause her daughter to seek care at [facility] today. There she was tachypneic and lethargic, sent to ED.   Here she was afebrile, saturating well on room air but tachypneic with very frequent nonproductive cough. Work up included positive SARS-CoV-2 PCR, CXR showed no pulmonary edema with resolution of previous pleural effusions and very faint peripheral left base opacity most consistent with atelectasis. WBC 13.5k w/neutrophilia and lymphopenia. Normal pH by VBG. Patient's daughter requested admission due to concern for the patient's weakness and possible further deterioration/shortness of breath at home.   Review of Systems: As mentioned in the history of present illness. All other systems reviewed and are negative. Past Medical History:  Diagnosis Date   Anemia    Cataract    Chronic kidney disease     CVA (cerebral infarction)    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   Dialysis patient (HCC)    M, W, F   Fistula    R arm   GERD (gastroesophageal reflux disease)    Hypertension    Renal disorder    Shortness of breath    Stroke [facility])    right side weakness   Past Surgical History:  Procedure Laterality Date   AMPUTATION Right 01/13/2020   Procedure: AMPUTATION RIGHT FOOT 1ST RAY;  Surgeon: Nadara Mustard, MD;  Location: [facility] OR;  Service: Orthopedics;  Laterality: Right;   AV FISTULA PLACEMENT Right 09/08/2013   Procedure: CREATION OF RIGHT BRACHIAL CEPHALIC ARTERIOVENOUS FISTULA ;  Surgeon: Pryor Ochoa, MD;  Location: [facility] OR;  Service: Vascular;  Laterality: Right;   BASCILIC VEIN TRANSPOSITION Right 01/26/2014   Procedure: Right Arm BASILIC VEIN TRANSPOSITION;  Surgeon: Pryor Ochoa, MD;  Location: [facility] OR;  Service: Vascular;  Laterality: Right;   BIOPSY  09/10/2021   Procedure: BIOPSY;  Surgeon: Dolores Frame, MD;  [facility];  Service: Gastroenterology;;  gastric   BIOPSY  10/07/2021   Procedure: BIOPSY;  Surgeon: Lemar Lofty., MD;  Location: [facility];  Service: Gastroenterology;;   BIOPSY  04/17/2022   Procedure: BIOPSY;  Surgeon: Lemar Lofty., MD;  Location: [facility];  Service: Gastroenterology;;   BIOPSY  02/03/2023   Procedure: BIOPSY;  Surgeon: Dolores Frame, MD;  [facility];  Service: Gastroenterology;;   CATARACT EXTRACTION W/PHACO  11/20/2011   Procedure: CATARACT EXTRACTION PHACO AND  History and Physical    Patient: [name]:[id number] DOB: Jun 03, 1941 DOA: 02/26/2023 DOS: the patient was seen and examined on 03/06/2023 PCP: Mirna Mires, MD  Patient coming from: Home  Chief Complaint:  Chief Complaint  Patient presents with   Shortness of Breath   HPI: [name] is a 82 y.o. female with medical history of asthma, IDT2DM, ESRD (HD MWF), HTN, CVA w/residual deficits/dysphagia, GERD, and right foot first ray amputation who was recently admitted 9/13 - 9/17 for generalized weakness and presented to the ED by way of urgent care today due to shortness of breath and cough. Her daughter supplements the patient's history. For the past 2 days she's developed a persistent, nonproductive cough and increasing fatigue, becoming weak again at home, also some wheezing for which daughter gave her albuterol inhaler. She's had no fevers, chills, chest pain. Her PCP thought dyspnea could be related to pulmonary edema, so suggested increase in UF with HD yesterday, though was limited by hypotension. Plan was to try midodrine prior to HD tomorrow, though symptoms worsened enough to cause her daughter to seek care at [facility] today. There she was tachypneic and lethargic, sent to ED.   Here she was afebrile, saturating well on room air but tachypneic with very frequent nonproductive cough. Work up included positive SARS-CoV-2 PCR, CXR showed no pulmonary edema with resolution of previous pleural effusions and very faint peripheral left base opacity most consistent with atelectasis. WBC 13.5k w/neutrophilia and lymphopenia. Normal pH by VBG. Patient's daughter requested admission due to concern for the patient's weakness and possible further deterioration/shortness of breath at home.   Review of Systems: As mentioned in the history of present illness. All other systems reviewed and are negative. Past Medical History:  Diagnosis Date   Anemia    Cataract    Chronic kidney disease     CVA (cerebral infarction)    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   Dialysis patient (HCC)    M, W, F   Fistula    R arm   GERD (gastroesophageal reflux disease)    Hypertension    Renal disorder    Shortness of breath    Stroke [facility])    right side weakness   Past Surgical History:  Procedure Laterality Date   AMPUTATION Right 01/13/2020   Procedure: AMPUTATION RIGHT FOOT 1ST RAY;  Surgeon: Nadara Mustard, MD;  Location: [facility] OR;  Service: Orthopedics;  Laterality: Right;   AV FISTULA PLACEMENT Right 09/08/2013   Procedure: CREATION OF RIGHT BRACHIAL CEPHALIC ARTERIOVENOUS FISTULA ;  Surgeon: Pryor Ochoa, MD;  Location: [facility] OR;  Service: Vascular;  Laterality: Right;   BASCILIC VEIN TRANSPOSITION Right 01/26/2014   Procedure: Right Arm BASILIC VEIN TRANSPOSITION;  Surgeon: Pryor Ochoa, MD;  Location: [facility] OR;  Service: Vascular;  Laterality: Right;   BIOPSY  09/10/2021   Procedure: BIOPSY;  Surgeon: Dolores Frame, MD;  [facility]: [facility];  Service: Gastroenterology;;  gastric   BIOPSY  10/07/2021   Procedure: BIOPSY;  Surgeon: Lemar Lofty., MD;  Location: [facility];  Service: Gastroenterology;;   BIOPSY  04/17/2022   Procedure: BIOPSY;  Surgeon: Lemar Lofty., MD;  Location: [facility];  Service: Gastroenterology;;   BIOPSY  02/03/2023   Procedure: BIOPSY;  Surgeon: Dolores Frame, MD;  Location: [facility];  Service: Gastroenterology;;   CATARACT EXTRACTION W/PHACO  11/20/2011   Procedure: CATARACT EXTRACTION PHACO AND

## 2023-03-13 DIAGNOSIS — E1122 Type 2 diabetes mellitus with diabetic chronic kidney disease: Secondary | ICD-10-CM | POA: Diagnosis not present

## 2023-03-13 DIAGNOSIS — Z794 Long term (current) use of insulin: Secondary | ICD-10-CM | POA: Diagnosis not present

## 2023-03-13 DIAGNOSIS — R7989 Other specified abnormal findings of blood chemistry: Secondary | ICD-10-CM | POA: Diagnosis not present

## 2023-03-13 DIAGNOSIS — N250 Renal osteodystrophy: Secondary | ICD-10-CM | POA: Diagnosis not present

## 2023-03-13 DIAGNOSIS — Z992 Dependence on renal dialysis: Secondary | ICD-10-CM | POA: Diagnosis not present

## 2023-03-13 DIAGNOSIS — R1319 Other dysphagia: Secondary | ICD-10-CM | POA: Diagnosis not present

## 2023-03-13 DIAGNOSIS — I10 Essential (primary) hypertension: Secondary | ICD-10-CM

## 2023-03-13 DIAGNOSIS — N186 End stage renal disease: Secondary | ICD-10-CM

## 2023-03-13 DIAGNOSIS — D631 Anemia in chronic kidney disease: Secondary | ICD-10-CM | POA: Diagnosis not present

## 2023-03-13 DIAGNOSIS — K219 Gastro-esophageal reflux disease without esophagitis: Secondary | ICD-10-CM

## 2023-03-13 DIAGNOSIS — E1165 Type 2 diabetes mellitus with hyperglycemia: Secondary | ICD-10-CM | POA: Diagnosis not present

## 2023-03-13 DIAGNOSIS — D696 Thrombocytopenia, unspecified: Secondary | ICD-10-CM

## 2023-03-13 DIAGNOSIS — R0602 Shortness of breath: Secondary | ICD-10-CM | POA: Diagnosis not present

## 2023-03-13 DIAGNOSIS — I959 Hypotension, unspecified: Secondary | ICD-10-CM | POA: Diagnosis not present

## 2023-03-13 DIAGNOSIS — U071 COVID-19: Secondary | ICD-10-CM | POA: Diagnosis not present

## 2023-03-13 DIAGNOSIS — I120 Hypertensive chronic kidney disease with stage 5 chronic kidney disease or end stage renal disease: Secondary | ICD-10-CM

## 2023-03-13 LAB — CBC
HCT: 29.5 % — ABNORMAL LOW (ref 36.0–46.0)
Hemoglobin: 10.3 g/dL — ABNORMAL LOW (ref 12.0–15.0)
MCH: 32 pg (ref 26.0–34.0)
MCHC: 34.9 g/dL (ref 30.0–36.0)
MCV: 91.6 fL (ref 80.0–100.0)
Platelets: 109 10*3/uL — ABNORMAL LOW (ref 150–400)
RBC: 3.22 MIL/uL — ABNORMAL LOW (ref 3.87–5.11)
RDW: 19.7 % — ABNORMAL HIGH (ref 11.5–15.5)
WBC: 12.4 10*3/uL — ABNORMAL HIGH (ref 4.0–10.5)
nRBC: 0.3 % — ABNORMAL HIGH (ref 0.0–0.2)

## 2023-03-13 LAB — BASIC METABOLIC PANEL
Anion gap: 16 — ABNORMAL HIGH (ref 5–15)
BUN: 31 mg/dL — ABNORMAL HIGH (ref 8–23)
CO2: 25 mmol/L (ref 22–32)
Calcium: 7.6 mg/dL — ABNORMAL LOW (ref 8.9–10.3)
Chloride: 95 mmol/L — ABNORMAL LOW (ref 98–111)
Creatinine, Ser: 4.52 mg/dL — ABNORMAL HIGH (ref 0.44–1.00)
GFR, Estimated: 9 mL/min — ABNORMAL LOW (ref >60)
Glucose, Bld: 150 mg/dL — ABNORMAL HIGH (ref 70–99)
Potassium: 4.1 mmol/L (ref 3.5–5.1)
Sodium: 136 mmol/L (ref 135–145)

## 2023-03-13 LAB — GLUCOSE, CAPILLARY
Glucose-Capillary: 171 mg/dL — ABNORMAL HIGH (ref 70–99)
Glucose-Capillary: 181 mg/dL — ABNORMAL HIGH (ref 70–99)
Glucose-Capillary: 214 mg/dL — ABNORMAL HIGH (ref 70–99)
Glucose-Capillary: 129 mg/dL — ABNORMAL HIGH (ref 70–99)

## 2023-03-13 LAB — HEPATITIS B SURFACE ANTIGEN: Hepatitis B Surface Ag: NONREACTIVE

## 2023-03-13 MED ORDER — MIDODRINE HCL 5 MG PO TABS
5.0000 mg | ORAL_TABLET | Freq: Three times a day (TID) | ORAL | Status: DC
  Administered 2023-03-13 (×2): 5 mg via ORAL
  Filled 2023-03-13 (×2): qty 1

## 2023-03-13 MED ORDER — ALBUMIN HUMAN 25 % IV SOLN
25.0000 g | INTRAVENOUS | Status: AC | PRN
  Administered 2023-03-13 (×2): 25 g via INTRAVENOUS
  Filled 2023-03-13 (×2): qty 100

## 2023-03-13 MED ORDER — CHLORHEXIDINE GLUCONATE CLOTH 2 % EX PADS
6.0000 | MEDICATED_PAD | Freq: Every day | CUTANEOUS | Status: DC
  Administered 2023-03-14: 6 via TOPICAL

## 2023-03-13 MED ORDER — PENTAFLUOROPROP-TETRAFLUOROETH EX AERO: 1.0000 | INHALATION_SPRAY | CUTANEOUS | Status: DC | PRN

## 2023-03-13 NOTE — Progress Notes (Signed)
  HEMODIALYSIS TREATMENT NOTE:  HD performed bedside due to Covid+ status.  Pre-HD, pt rouses from sleep and is oriented to self only, c/o itching and "something in my [right] ear."   90/52, spO2 100% on RA.  Orders reviewed with Dr. Ronalee Belts.  No UF unless SBP>105, Albumin 25g IV prn x2 doses.  Albumin was given at start of HD for initial BP 82/57 (65) -- minimal improvement.   Unable to ultrafiltrate due to (asymptomatic) hypotension - SBPs remained in 90s.  A second dose of Albumin 25g was given -- no effect.  3.25 hour treatment completed.  Net UF +700.  All blood was returned.  Post-treatment:  03/13/23 2300  Vitals  Temp 98 F (36.7 C)  Temp Source Oral  BP 130/65  MAP (mmHg) 76  BP Location Left Arm  BP Method Automatic  Patient Position (if appropriate) Lying  Pulse Rate 80  Pulse Rate Source Monitor  Resp 18  Oxygen Therapy  SpO2 100 %  O2 Device Room Air  Post Treatment  Dialyzer Clearance Lightly streaked  Hemodialysis Intake (mL) 0 mL  Liters Processed 71.6  Fluid Removed (mL) 0 mL  Tolerated HD Treatment No (Comment)  Post-Hemodialysis Comments UF limited by hypotension  AVG/AVF Arterial Site Held (minutes) 8 minutes  AVG/AVF Venous Site Held (minutes) 8 minutes  Fistula / Graft Right Upper arm  No placement date or time found.   Orientation: Right  Access Location: Upper arm  Fistula / Graft Assessment Thrill;Bruit  Status Patent    Arman Filter, RN [id number]

## 2023-03-13 NOTE — Evaluation (Addendum)
Occupational Therapy Evaluation Patient Details Name: [name] MRN: [id number] DOB: 03/10/1941 Today's Date: 03/13/2023 Assessment: Pt agreeable to OT and PT co-evaluation. Pt is generlaly weak with poor endurance and need for mod A to complete stand pivot transfer. Mod A needed for lower body dressing and for bed mobility. Pt seems generally deconditioned and needed much assist. Pt was left in bed with call bell within reach. Pt will benefit from continued OT in the hospital and recommended venue below to increase strength, balance, and endurance for safe ADL's.      03/13/23 1000  OT Visit Information  Last OT Received On 03/13/23  Assistance Needed +1  PT/OT/SLP Co-Evaluation/Treatment Yes  Reason for Co-Treatment To address functional/ADL transfers  OT goals addressed during session ADL's and self-care  History of Present Illness [name] is a 82 y.o. female with medical history of asthma, IDT2DM, ESRD (HD MWF), HTN, CVA w/residual deficits/dysphagia, GERD, and right foot first ray amputation who was recently admitted 9/13 - 9/17 for generalized weakness and presented to the ED by way of urgent care today due to shortness of breath and cough. Her daughter supplements the patient's history. For the past 2 days she's developed a persistent, nonproductive cough and increasing fatigue, becoming weak again at home, also some wheezing for which daughter gave her albuterol inhaler. She's had no fevers, chills, chest pain. Her PCP thought dyspnea could be related to pulmonary edema, so suggested increase in UF with HD yesterday, though was limited by hypotension. Plan was to try midodrine prior to HD tomorrow, though symptoms worsened enough to cause her daughter to seek care at [facility] today. There she was tachypneic and lethargic, sent to ED. (per MD)  Precautions  Precautions Fall  Restrictions  Weight Bearing Restrictions No  Home Living  Family/patient expects to be discharged  to: Private residence  Living Arrangements Spouse/significant other  Available Help at Discharge Available 24 hours/day;Family  Type of Home House  Home Access Stairs to enter  Entrance Stairs-Number of Steps 3  Entrance Stairs-Rails Can reach both;Right;Left  Home Layout One level  Bathroom Nurse, children's Yes  How Accessible Accessible via Holiday representative seat;Wheelchair - Forensic psychologist (2 wheels);Rollator (4 wheels);BSC/3in1;Grab bars - tub/shower;Grab bars - toilet  Prior Function  Prior Level of Function  Needs assist  Mobility Comments Typically uses RW for mobility; reported increased difficulty as of late.  ADLs Comments Assisted by family  Pain Assessment  Pain Assessment Faces  Faces Pain Scale 0  Cognition  Arousal Alert  Behavior During Therapy WFL for tasks assessed/performed  Overall Cognitive Status Within Functional Limits for tasks assessed  Communication  Communication Hearing impairment  Upper Extremity Assessment  Upper Extremity Assessment Generalized weakness  Lower Extremity Assessment  Lower Extremity Assessment Defer to PT evaluation  Vision- History  Baseline Vision/History 1 Wears glasses  Ability to See in Adequate Light 1 Impaired  Patient Visual Report No change from baseline  Vision- Assessment  Vision Assessment? No apparent visual deficits  Perception  Perception Not tested  Praxis  Praxis Not tested  ADL  Overall ADL's  Needs assistance/impaired  Grooming Set up;Sitting  Upper Body Bathing Set up;Minimal assistance;Sitting  Upper Body Dressing  Set up;Minimal assistance;Sitting  Lower Body Dressing Sitting/lateral leans;Moderate assistance  Toilet Transfer Moderate assistance;Rolling walker (2 wheels);Stand-pivot  Statistician Details (indicate cue type and reason) simualted via EOB to chair  Toileting- Clothing Manipulation and  Hygiene Moderate  assistance;Sitting/lateral lean  Functional mobility during ADLs Moderate assistance;Rolling walker (2 wheels)  Bed Mobility  Overal bed mobility Needs Assistance  Bed Mobility Supine to Sit;Sit to Supine  Supine to sit Mod assist  Sit to supine Mod assist  General bed mobility comments pt fatigued easily; assist to come to sit and to move B LE into bed.  Transfers  Overall transfer level Needs assistance  Equipment used Rolling walker (2 wheels)  Transfers Sit to/from Stand;Bed to chair/wheelchair/BSC  Sit to Stand Mod assist  Bed to/from chair/wheelchair/BSC transfer type: Step pivot  Step pivot transfers Mod assist  General transfer comment Unsteady; poor balance; decreased endurance  Balance  Overall balance assessment Needs assistance  Sitting-balance support Feet supported;No upper extremity supported  Sitting balance-Leahy Scale Fair  Sitting balance - Comments poor to fair  Standing balance support During functional activity;Bilateral upper extremity supported;Reliant on assistive device for balance  Standing balance-Leahy Scale Poor  Standing balance comment using RW  OT - End of Session  Equipment Utilized During Treatment Rolling walker (2 wheels)  Activity Tolerance Patient tolerated treatment well  Patient left with call bell/phone within reach;in bed  OT Assessment  OT Recommendation/Assessment Patient needs continued OT Services  OT Visit Diagnosis Muscle weakness (generalized) (M62.81);Unsteadiness on feet (R26.81);Other abnormalities of gait and mobility (R26.89)  OT Problem List Decreased strength;Decreased range of motion;Decreased activity tolerance;Impaired balance (sitting and/or standing)               03/13/23 1000  AM-PAC OT "6 Clicks" Daily Activity Outcome Measure (Version 2)  Help from another person eating meals? 3  Help from another person taking care of personal grooming? 3  Help from another person toileting, which includes using toliet,  bedpan, or urinal? 2  Help from another person bathing (including washing, rinsing, drying)? 2  Help from another person to put on and taking off regular upper body clothing? 3  Help from another person to put on and taking off regular lower body clothing? 2  6 Click Score 15    03/13/23 1000  Acute Rehab OT Goals  Patient Stated Goal return home  OT Goal Formulation With patient  Time For Goal Achievement 03/27/23  Potential to Achieve Goals Good                  Time: 4098-1191 OT Time Calculation (min): 16 min Charges:  OT General Charges $OT Visit: 1 Visit OT Evaluation $OT Eval Low Complexity: 1 Low  Roisin Mones OT, MOT   [employer] 03/13/2023, 10:15 AM

## 2023-03-13 NOTE — Progress Notes (Signed)
PROGRESS NOTE  [name] WGN:[id number] DOB: [birth year]   PCP: Mirna Mires, MD  Patient is from: Home.  DOA: Apr 09, 2023 LOS: 0  Chief complaints Chief Complaint  Patient presents with   Shortness of Breath     Brief Narrative / Interim history: 82 year old F with PMH of IDDM-2, ESRD on HD MWF, CVA with residual deficit and dysphagia, HTN,  GERD and recent hospitalization from 9/13-9/17 for generalized weakness returning to ED through urgent care due to shortness of breath, cough and generalized weakness and admitted for asthma exacerbation in the setting of COVID-19 infection and generalized weakness.  COVID-19 PCR positive.  CXR without acute finding.  WBC 13.5 with left shift and lymphopenia.   Subjective: Seen and examined earlier this morning.  No major events overnight of this morning.  No complaints but not a great historian.  She is awake and oriented x 4 but not to date.  Does not seem to have great insight.   Objective: Vitals:   09-Apr-2023 2105 03/13/23 0544 03/13/23 0854 03/13/23 1204  BP: 119/63 (!) 97/47 (!) 105/53 (!) 98/54  Pulse: 89 86 71 86  Resp: 20 20    Temp: 98 F (36.7 C) 98.2 F (36.8 C)    TempSrc:  Oral    SpO2: 98% 98% 100%   Weight:      Height:        Examination:  GENERAL: No apparent distress.  Nontoxic. HEENT: MMM.  Vision and hearing grossly intact.  NECK: Supple.  No apparent JVD.  RESP:  No IWOB.  Fair aeration bilaterally. CVS:  RRR. Heart sounds normal.  ABD/GI/GU: BS+. Abd soft, NTND.  MSK/EXT:  Moves extremities. No apparent deformity. No edema.  SKIN: no apparent skin lesion or wound NEURO: Awake, alert and oriented x 4 except date.  She does need to have great insight.  No apparent focal neuro deficit. PSYCH: Calm. Normal affect.   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  CXR without acute finding.  Reportedly wheezing on admission. -Continue prednisone and scheduled nebulizers. -Continue supportive care and isolation precaution -PT/OT eval  Uncontrolled DM-2 with hyperglycemia and ESRD: A1c 10.4% on 7/26. Recent Labs  Lab 04-09-2023 1704 Apr 09, 2023 2104 03/13/23 0757 03/13/23 1128  GLUCAP 154* 112* 171* 181*  -Continue Tradjenta -Continue SSI-sensitive   ESRD on HD MWF -Per nephrology   Anemia of renal disease: Stable Generalized weakness/physical deconditioning: Likely due to COVID-19 -PT/OT   GERD:  -Continue PPI   History of CVA/HLD -Continue ASA, no statin on home med list   Thrombocytopenia: Chronic, stable, no bleeding.    Demand myocardial ischemia: Troponin flat and very minimally elevated in ESRD.  No chest pain or ischemic ECG changes.  -Continue home meds.   Body mass index is 26 kg/m.          DVT prophylaxis:  heparin injection 5,000 Units Start: 04/09/23 1700  Code Status: Full  code Family Communication: None at bedside Level of care: Med-Surg Status is: Observation The patient will require care spanning > 2 midnights and should be moved to inpatient because: Generalized weakness and COVID-19 infection   Final disposition: SNF Consultants:  None  35 minutes with more than 50% spent in reviewing records, counseling patient/family and coordinating care.   Sch Meds:  Scheduled Meds:  ALPRAZolam  0.25 mg Oral Daily   aspirin EC  81 mg Oral  Daily   [START ON Apr 06, 2023] Chlorhexidine Gluconate Cloth  6 each Topical Q0600   dextromethorphan-guaiFENesin  1 tablet Oral BID   heparin  5,000 Units Subcutaneous Q8H   insulin aspart  0-5 Units Subcutaneous QHS   insulin aspart  0-9 Units Subcutaneous TID WC   ipratropium-albuterol  3 mL Nebulization BID   labetalol  200 mg Oral BID   linagliptin  5 mg Oral Daily   midodrine  5 mg Oral TID WC   multivitamin  1 tablet Oral QHS   pantoprazole  40 mg Oral Daily   predniSONE  40 mg Oral Q breakfast   sodium chloride flush  3 mL Intravenous Q12H   Continuous Infusions: PRN Meds:.acetaminophen **OR** acetaminophen, albuterol, HYDROcodone bit-homatropine  Antimicrobials: Anti-infectives (From admission, onward)    None        I have personally reviewed the following labs and images: CBC: Recent Labs  Lab 02/17/2023 1153 03/13/23 0503  WBC 13.5* 12.4*  NEUTROABS 11.8*  --   HGB 12.0 10.3*  HCT 36.0 29.5*  MCV 94.0 91.6  PLT 109* 109*   BMP &GFR Recent Labs  Lab 02/23/2023 1153 03/13/23 0503  NA 136 136  K 3.9 4.1  CL 94* 95*  CO2 27 25  GLUCOSE 221* 150*  BUN 23 31*  CREATININE 3.70* 4.52*  CALCIUM 7.8* 7.6*   Estimated Creatinine Clearance: 10 mL/min (A) (by C-G formula based on SCr of 4.52 mg/dL (H)). Liver & Pancreas: Recent Labs  Lab 02/15/2023 1153  AST 29  ALT 16  ALKPHOS 66  BILITOT 1.6*  PROT 7.6  ALBUMIN 3.2*   No results for input(s): "LIPASE", "AMYLASE" in the last 168 hours. No results for input(s): "AMMONIA" in the last 168 hours. Diabetic: No results for input(s): "HGBA1C" in the last 72 hours. Recent Labs  Lab 02/24/2023 1704 03/13/2023 2104 03/13/23 0757 03/13/23 1128  GLUCAP 154* 112* 171* 181*   Cardiac Enzymes: No results for input(s): "CKTOTAL", "CKMB", "CKMBINDEX", "TROPONINI" in the last 168 hours. No results for input(s): "PROBNP" in the last 8760 hours. Coagulation Profile: No results for input(s): "INR", "PROTIME" in the  last 168 hours. Thyroid Function Tests: No results for input(s): "TSH", "T4TOTAL", "FREET4", "T3FREE", "THYROIDAB" in the last 72 hours. Lipid Profile: No results for input(s): "CHOL", "HDL", "LDLCALC", "TRIG", "CHOLHDL", "LDLDIRECT" in the last 72 hours. Anemia Panel: No results for input(s): "VITAMINB12", "FOLATE", "FERRITIN", "TIBC", "IRON", "RETICCTPCT" in the last 72 hours. Urine analysis:    Component Value Date/Time   COLORURINE YELLOW 10/15/2017 2244   APPEARANCEUR HAZY (A) 10/15/2017 2244   LABSPEC 1.015 10/15/2017 2244   PHURINE 7.0 10/15/2017 2244   GLUCOSEU >=500 (A) 10/15/2017 2244   HGBUR SMALL (A) 10/15/2017 2244   BILIRUBINUR NEGATIVE 10/15/2017 2244   KETONESUR NEGATIVE 10/15/2017 2244   PROTEINUR 100 (A) 10/15/2017 2244   UROBILINOGEN 0.2 11/19/2014 0909   NITRITE NEGATIVE 10/15/2017 2244   LEUKOCYTESUR MODERATE (A) 10/15/2017 2244   Sepsis Labs: Invalid input(s): "PROCALCITONIN", "LACTICIDVEN"  Microbiology: Recent Results (  from the past 240 hour(s))  SARS Coronavirus 2 by RT PCR (hospital order, performed in [facility] hospital lab) *cepheid single result test* Anterior Nasal Swab     Status: Abnormal   Collection Time: 2023/04/09 12:45 PM   Specimen: Anterior Nasal Swab  Result Value Ref Range Status   SARS Coronavirus 2 by RT PCR POSITIVE (A) NEGATIVE Final    Comment: (NOTE) SARS-CoV-2 target nucleic acids are DETECTED  SARS-CoV-2 RNA is generally detectable in upper respiratory specimens  during the acute phase of infection.  Positive results are indicative  of the presence of the identified virus, but do not rule out bacterial infection or co-infection with other pathogens not detected by the test.  Clinical correlation with patient history and  other diagnostic information is necessary to determine patient infection status.  The expected result is negative.  Fact Sheet for Patients:   RoadLapTop.co.za   Fact Sheet for  Healthcare Providers:   http://kim-miller.com/    This test is not yet approved or cleared by the [place] FDA and  has been authorized for detection and/or diagnosis of SARS-CoV-2 by FDA under an Emergency Use Authorization (EUA).  This EUA will remain in effect (meaning this test can be used) for the duration of  the COVID-19 declaration under Section 564(b)(1)  of the Act, 21 U.S.C. section 360-bbb-3(b)(1), unless the authorization is terminated or revoked sooner.   Performed at [facility], [address] [place], [place] [address]     Radiology Studies: No results found.    Chrishawna Farina T. Stanislav Gervase [facility]  If 7PM-7AM, please contact night-coverage [email or web address] 03/13/2023, 12:43 PM

## 2023-03-13 NOTE — Plan of Care (Signed)

## 2023-03-13 NOTE — Consult Note (Signed)
initiation of antibiotics    Strongly encourage escalation           of antibiotics                                     -----------------------------                                           PCT <= 0.25 ng/mL                                                 OR                                        > 80% decrease in PCT                                      Discontinue / Do not initiate                                             antibiotics  Performed at [facility], [address] [place], [place] [address] CORRECTED ON 09/26 AT 1840: PREVIOUSLY REPORTED AS <0.10        Interpretation: PCT (Procalcitonin) <= 0.5 ng/mL: Systemic infection (sepsis) is not likely. Local bacterial infection is possible.   Glucose, capillary     Status: Abnormal   Collection Time:  03/16/2023  5:04 PM  Result Value Ref Range   Glucose-Capillary 154 (H) 70 - 99 mg/dL    Comment: Glucose reference range applies only to samples taken after fasting for at least 8 hours.  Glucose, capillary     Status: Abnormal   Collection Time: 03/01/2023  9:04 PM  Result Value Ref Range   Glucose-Capillary 112 (H) 70 - 99 mg/dL    Comment: Glucose reference range applies only to samples taken after fasting for at least 8 hours.  Basic metabolic panel     Status: Abnormal   Collection Time: 03/13/23  5:03 AM  Result Value Ref Range   Sodium 136 135 - 145 mmol/L   Potassium 4.1 3.5 - 5.1 mmol/L   Chloride 95 (L) 98 - 111 mmol/L   CO2 25 22 - 32 mmol/L   Glucose, Bld 150 (H) 70 - 99 mg/dL    Comment: Glucose reference range applies only to samples taken after fasting for at least 8 hours.   BUN 31 (H) 8 - 23 mg/dL   Creatinine, Ser 6.04 (H) 0.44 - 1.00 mg/dL   Calcium 7.6 (L) 8.9 - 10.3 mg/dL   GFR, Estimated 9 (L) >60 mL/min    Comment: (NOTE) Calculated using the CKD-EPI Creatinine Equation (2021)    Anion gap 16 (H) 5 - 15    Comment: Performed at [facility], [address], [place], [place] [address]  CBC     Status: Abnormal  [facility], [address], [place], [place] [address]  Procalcitonin     Status: None   Collection Time: 03/03/2023  4:18 PM  Result Value Ref Range   Procalcitonin 0.29 ng/mL    Comment: RESULT CALLED TO, READ BACK BY AND VERIFIED WITH: RAY,A ON 02/15/2023 AT 1840 BY LOY,C        Interpretation: PCT (Procalcitonin) <= 0.5 ng/mL: Systemic infection (sepsis) is not likely. Local bacterial infection is possible. (NOTE)       Sepsis PCT Algorithm           Lower Respiratory Tract                                      Infection PCT  Algorithm    ----------------------------     ----------------------------         PCT < 0.25 ng/mL                PCT < 0.10 ng/mL          Strongly encourage             Strongly discourage   discontinuation of antibiotics    initiation of antibiotics    ----------------------------     -----------------------------       PCT 0.25 - 0.50 ng/mL            PCT 0.10 - 0.25 ng/mL               OR       >80% decrease in PCT            Discourage initiation of                                            antibiotics      Encourage discontinuation           of antibiotics    ----------------------------     ---------------------------- -         PCT >= 0.50 ng/mL              PCT 0.26 - 0.50 ng/mL               AND       <80% decrease in PCT             Encourage initiation of                                             antibiotics       Encourage continuation           of antibiotics    ----------------------------     -----------------------------        PCT >= 0.50 ng/mL                  PCT > 0.50 ng/mL               AND         increase in PCT                  Strongly encourage  initiation of antibiotics    Strongly encourage escalation           of antibiotics                                     -----------------------------                                           PCT <= 0.25 ng/mL                                                 OR                                        > 80% decrease in PCT                                      Discontinue / Do not initiate                                             antibiotics  Performed at [facility], [address] [place], [place] [address] CORRECTED ON 09/26 AT 1840: PREVIOUSLY REPORTED AS <0.10        Interpretation: PCT (Procalcitonin) <= 0.5 ng/mL: Systemic infection (sepsis) is not likely. Local bacterial infection is possible.   Glucose, capillary     Status: Abnormal   Collection Time:  03/16/2023  5:04 PM  Result Value Ref Range   Glucose-Capillary 154 (H) 70 - 99 mg/dL    Comment: Glucose reference range applies only to samples taken after fasting for at least 8 hours.  Glucose, capillary     Status: Abnormal   Collection Time: 03/01/2023  9:04 PM  Result Value Ref Range   Glucose-Capillary 112 (H) 70 - 99 mg/dL    Comment: Glucose reference range applies only to samples taken after fasting for at least 8 hours.  Basic metabolic panel     Status: Abnormal   Collection Time: 03/13/23  5:03 AM  Result Value Ref Range   Sodium 136 135 - 145 mmol/L   Potassium 4.1 3.5 - 5.1 mmol/L   Chloride 95 (L) 98 - 111 mmol/L   CO2 25 22 - 32 mmol/L   Glucose, Bld 150 (H) 70 - 99 mg/dL    Comment: Glucose reference range applies only to samples taken after fasting for at least 8 hours.   BUN 31 (H) 8 - 23 mg/dL   Creatinine, Ser 6.04 (H) 0.44 - 1.00 mg/dL   Calcium 7.6 (L) 8.9 - 10.3 mg/dL   GFR, Estimated 9 (L) >60 mL/min    Comment: (NOTE) Calculated using the CKD-EPI Creatinine Equation (2021)    Anion gap 16 (H) 5 - 15    Comment: Performed at [facility], [address], [place], [place] [address]  CBC     Status: Abnormal  initiation of antibiotics    Strongly encourage escalation           of antibiotics                                     -----------------------------                                           PCT <= 0.25 ng/mL                                                 OR                                        > 80% decrease in PCT                                      Discontinue / Do not initiate                                             antibiotics  Performed at [facility], [address] [place], [place] [address] CORRECTED ON 09/26 AT 1840: PREVIOUSLY REPORTED AS <0.10        Interpretation: PCT (Procalcitonin) <= 0.5 ng/mL: Systemic infection (sepsis) is not likely. Local bacterial infection is possible.   Glucose, capillary     Status: Abnormal   Collection Time:  03/16/2023  5:04 PM  Result Value Ref Range   Glucose-Capillary 154 (H) 70 - 99 mg/dL    Comment: Glucose reference range applies only to samples taken after fasting for at least 8 hours.  Glucose, capillary     Status: Abnormal   Collection Time: 03/01/2023  9:04 PM  Result Value Ref Range   Glucose-Capillary 112 (H) 70 - 99 mg/dL    Comment: Glucose reference range applies only to samples taken after fasting for at least 8 hours.  Basic metabolic panel     Status: Abnormal   Collection Time: 03/13/23  5:03 AM  Result Value Ref Range   Sodium 136 135 - 145 mmol/L   Potassium 4.1 3.5 - 5.1 mmol/L   Chloride 95 (L) 98 - 111 mmol/L   CO2 25 22 - 32 mmol/L   Glucose, Bld 150 (H) 70 - 99 mg/dL    Comment: Glucose reference range applies only to samples taken after fasting for at least 8 hours.   BUN 31 (H) 8 - 23 mg/dL   Creatinine, Ser 6.04 (H) 0.44 - 1.00 mg/dL   Calcium 7.6 (L) 8.9 - 10.3 mg/dL   GFR, Estimated 9 (L) >60 mL/min    Comment: (NOTE) Calculated using the CKD-EPI Creatinine Equation (2021)    Anion gap 16 (H) 5 - 15    Comment: Performed at [facility], [address], [place], [place] [address]  CBC     Status: Abnormal  initiation of antibiotics    Strongly encourage escalation           of antibiotics                                     -----------------------------                                           PCT <= 0.25 ng/mL                                                 OR                                        > 80% decrease in PCT                                      Discontinue / Do not initiate                                             antibiotics  Performed at [facility], [address] [place], [place] [address] CORRECTED ON 09/26 AT 1840: PREVIOUSLY REPORTED AS <0.10        Interpretation: PCT (Procalcitonin) <= 0.5 ng/mL: Systemic infection (sepsis) is not likely. Local bacterial infection is possible.   Glucose, capillary     Status: Abnormal   Collection Time:  03/16/2023  5:04 PM  Result Value Ref Range   Glucose-Capillary 154 (H) 70 - 99 mg/dL    Comment: Glucose reference range applies only to samples taken after fasting for at least 8 hours.  Glucose, capillary     Status: Abnormal   Collection Time: 03/01/2023  9:04 PM  Result Value Ref Range   Glucose-Capillary 112 (H) 70 - 99 mg/dL    Comment: Glucose reference range applies only to samples taken after fasting for at least 8 hours.  Basic metabolic panel     Status: Abnormal   Collection Time: 03/13/23  5:03 AM  Result Value Ref Range   Sodium 136 135 - 145 mmol/L   Potassium 4.1 3.5 - 5.1 mmol/L   Chloride 95 (L) 98 - 111 mmol/L   CO2 25 22 - 32 mmol/L   Glucose, Bld 150 (H) 70 - 99 mg/dL    Comment: Glucose reference range applies only to samples taken after fasting for at least 8 hours.   BUN 31 (H) 8 - 23 mg/dL   Creatinine, Ser 6.04 (H) 0.44 - 1.00 mg/dL   Calcium 7.6 (L) 8.9 - 10.3 mg/dL   GFR, Estimated 9 (L) >60 mL/min    Comment: (NOTE) Calculated using the CKD-EPI Creatinine Equation (2021)    Anion gap 16 (H) 5 - 15    Comment: Performed at [facility], [address], [place], [place] [address]  CBC     Status: Abnormal  initiation of antibiotics    Strongly encourage escalation           of antibiotics                                     -----------------------------                                           PCT <= 0.25 ng/mL                                                 OR                                        > 80% decrease in PCT                                      Discontinue / Do not initiate                                             antibiotics  Performed at [facility], [address] [place], [place] [address] CORRECTED ON 09/26 AT 1840: PREVIOUSLY REPORTED AS <0.10        Interpretation: PCT (Procalcitonin) <= 0.5 ng/mL: Systemic infection (sepsis) is not likely. Local bacterial infection is possible.   Glucose, capillary     Status: Abnormal   Collection Time:  03/16/2023  5:04 PM  Result Value Ref Range   Glucose-Capillary 154 (H) 70 - 99 mg/dL    Comment: Glucose reference range applies only to samples taken after fasting for at least 8 hours.  Glucose, capillary     Status: Abnormal   Collection Time: 03/01/2023  9:04 PM  Result Value Ref Range   Glucose-Capillary 112 (H) 70 - 99 mg/dL    Comment: Glucose reference range applies only to samples taken after fasting for at least 8 hours.  Basic metabolic panel     Status: Abnormal   Collection Time: 03/13/23  5:03 AM  Result Value Ref Range   Sodium 136 135 - 145 mmol/L   Potassium 4.1 3.5 - 5.1 mmol/L   Chloride 95 (L) 98 - 111 mmol/L   CO2 25 22 - 32 mmol/L   Glucose, Bld 150 (H) 70 - 99 mg/dL    Comment: Glucose reference range applies only to samples taken after fasting for at least 8 hours.   BUN 31 (H) 8 - 23 mg/dL   Creatinine, Ser 6.04 (H) 0.44 - 1.00 mg/dL   Calcium 7.6 (L) 8.9 - 10.3 mg/dL   GFR, Estimated 9 (L) >60 mL/min    Comment: (NOTE) Calculated using the CKD-EPI Creatinine Equation (2021)    Anion gap 16 (H) 5 - 15    Comment: Performed at [facility], [address], [place], [place] [address]  CBC     Status: Abnormal  initiation of antibiotics    Strongly encourage escalation           of antibiotics                                     -----------------------------                                           PCT <= 0.25 ng/mL                                                 OR                                        > 80% decrease in PCT                                      Discontinue / Do not initiate                                             antibiotics  Performed at [facility], [address] [place], [place] [address] CORRECTED ON 09/26 AT 1840: PREVIOUSLY REPORTED AS <0.10        Interpretation: PCT (Procalcitonin) <= 0.5 ng/mL: Systemic infection (sepsis) is not likely. Local bacterial infection is possible.   Glucose, capillary     Status: Abnormal   Collection Time:  03/16/2023  5:04 PM  Result Value Ref Range   Glucose-Capillary 154 (H) 70 - 99 mg/dL    Comment: Glucose reference range applies only to samples taken after fasting for at least 8 hours.  Glucose, capillary     Status: Abnormal   Collection Time: 03/01/2023  9:04 PM  Result Value Ref Range   Glucose-Capillary 112 (H) 70 - 99 mg/dL    Comment: Glucose reference range applies only to samples taken after fasting for at least 8 hours.  Basic metabolic panel     Status: Abnormal   Collection Time: 03/13/23  5:03 AM  Result Value Ref Range   Sodium 136 135 - 145 mmol/L   Potassium 4.1 3.5 - 5.1 mmol/L   Chloride 95 (L) 98 - 111 mmol/L   CO2 25 22 - 32 mmol/L   Glucose, Bld 150 (H) 70 - 99 mg/dL    Comment: Glucose reference range applies only to samples taken after fasting for at least 8 hours.   BUN 31 (H) 8 - 23 mg/dL   Creatinine, Ser 6.04 (H) 0.44 - 1.00 mg/dL   Calcium 7.6 (L) 8.9 - 10.3 mg/dL   GFR, Estimated 9 (L) >60 mL/min    Comment: (NOTE) Calculated using the CKD-EPI Creatinine Equation (2021)    Anion gap 16 (H) 5 - 15    Comment: Performed at [facility], [address], [place], [place] [address]  CBC     Status: Abnormal

## 2023-03-13 NOTE — Evaluation (Signed)
Physical Therapy Evaluation Patient Details Name: [name] MRN: [id number] DOB: 11/13/1940 Today's Date: 03/13/2023  History of Present Illness  [name] is a 82 y.o. female with medical history of asthma, IDT2DM, ESRD (HD MWF), HTN, CVA w/residual deficits/dysphagia, GERD, and right foot first ray amputation who was recently admitted 9/13 - 9/17 for generalized weakness and presented to the ED by way of urgent care today due to shortness of breath and cough. Her daughter supplements the patient's history. For the past 2 days she's developed a persistent, nonproductive cough and increasing fatigue, becoming weak again at home, also some wheezing for which daughter gave her albuterol inhaler. She's had no fevers, chills, chest pain. Her PCP thought dyspnea could be related to pulmonary edema, so suggested increase in UF with HD yesterday, though was limited by hypotension. Plan was to try midodrine prior to HD tomorrow, though symptoms worsened enough to cause her daughter to seek care at [facility] today. There she was tachypneic and lethargic, sent to ED.   Clinical Impression  Patient demonstrates slow labored movement for getting into/out of bed, very unsteady on feet and limited to a few side steps due to poor standing balance with near fall, had a brief episode of poor response to questions once bed that resolved, then patient able to answer questions consistently.  Patient tolerated staying up in chair after therapy.  Patient will benefit from continued skilled physical therapy in hospital and recommended venue below to increase strength, balance, endurance for safe ADLs and gait.          If plan is discharge home, recommend the following: A lot of help with bathing/dressing/bathroom;A lot of help with walking and/or transfers;Help with stairs or ramp for entrance;Assistance with cooking/housework   Can travel by private vehicle   No    Equipment Recommendations None  recommended by PT  Recommendations for Other Services       Functional Status Assessment Patient has had a recent decline in their functional status and demonstrates the ability to make significant improvements in function in a reasonable and predictable amount of time.     Precautions / Restrictions Precautions Precautions: Fall Restrictions Weight Bearing Restrictions: No      Mobility  Bed Mobility Overal bed mobility: Needs Assistance Bed Mobility: Supine to Sit, Sit to Supine     Supine to sit: Mod assist Sit to supine: Mod assist   General bed mobility comments: slow labored movement due to weakness    Transfers Overall transfer level: Needs assistance Equipment used: Rolling walker (2 wheels) Transfers: Sit to/from Stand, Bed to chair/wheelchair/BSC Sit to Stand: Mod assist   Step pivot transfers: Mod assist       General transfer comment: unsteady labored movement    Ambulation/Gait Ambulation/Gait assistance: Mod assist Gait Distance (Feet): 4 Feet Assistive device: Rolling walker (2 wheels) Gait Pattern/deviations: Decreased step length - right, Decreased step length - left, Decreased stride length Gait velocity: slow     General Gait Details: limited to a few slow labored unsteady side steps before having to sit due to near fall  Stairs            Wheelchair Mobility     Tilt Bed    Modified Rankin (Stroke Patients Only)       Balance Overall balance assessment: Needs assistance Sitting-balance support: Feet supported, No upper extremity supported Sitting balance-Leahy Scale: Poor Sitting balance - Comments: fair/poor Postural control: Posterior lean Standing balance support: Reliant on  assistive device for balance, During functional activity, Bilateral upper extremity supported Standing balance-Leahy Scale: Poor Standing balance comment: using RW                             Pertinent Vitals/Pain Pain  Assessment Pain Assessment: Faces Faces Pain Scale: No hurt    Home Living Family/patient expects to be discharged to:: Private residence Living Arrangements: Spouse/significant other Available Help at Discharge: Available 24 hours/day;Family Type of Home: House Home Access: Stairs to enter Entrance Stairs-Rails: Can reach both;Right;Left Entrance Stairs-Number of Steps: 3   Home Layout: One level Home Equipment: Shower seat;Wheelchair - Forensic psychologist (2 wheels);Rollator (4 wheels);BSC/3in1;Grab bars - tub/shower;Grab bars - toilet Additional Comments: patient states her 2 sons live with her and her daughter stays with her at night while the sons work    Prior Function Prior Level of Function : Needs assist       Physical Assist : Mobility (physical);ADLs (physical) Mobility (physical): Bed mobility;Transfers;Stairs;Gait   Mobility Comments: household ambulator using RW ADLs Comments: Assisted by family     Extremity/Trunk Assessment   Upper Extremity Assessment Upper Extremity Assessment: Defer to OT evaluation    Lower Extremity Assessment Lower Extremity Assessment: Generalized weakness    Cervical / Trunk Assessment Cervical / Trunk Assessment: Kyphotic  Communication   Communication Communication: Hearing impairment  Cognition Arousal: Alert Behavior During Therapy: WFL for tasks assessed/performed Overall Cognitive Status: Within Functional Limits for tasks assessed                                          General Comments      Exercises     Assessment/Plan    PT Assessment Patient needs continued PT services  PT Problem List Decreased strength;Decreased activity tolerance;Decreased balance;Decreased mobility       PT Treatment Interventions Functional mobility training;Therapeutic activities;Therapeutic exercise;Stair training;Gait training;Balance training;Patient/family education;DME instruction    PT Goals (Current  goals can be found in the Care Plan section)  Acute Rehab PT Goals Patient Stated Goal: return home after rehab PT Goal Formulation: With patient Time For Goal Achievement: 03/27/23 Potential to Achieve Goals: Good    Frequency Min 3X/week     Co-evaluation PT/OT/SLP Co-Evaluation/Treatment: Yes Reason for Co-Treatment: To address functional/ADL transfers PT goals addressed during session: Mobility/safety with mobility;Balance;Proper use of DME         AM-PAC PT "6 Clicks" Mobility  Outcome Measure Help needed turning from your back to your side while in a flat bed without using bedrails?: A Lot Help needed moving from lying on your back to sitting on the side of a flat bed without using bedrails?: A Lot Help needed moving to and from a bed to a chair (including a wheelchair)?: A Lot Help needed standing up from a chair using your arms (e.g., wheelchair or bedside chair)?: A Lot Help needed to walk in hospital room?: A Lot Help needed climbing 3-5 steps with a railing? : Total 6 Click Score: 11    End of Session   Activity Tolerance: Patient tolerated treatment well;Patient limited by fatigue Patient left: in chair;with call bell/phone within reach Nurse Communication: Mobility status PT Visit Diagnosis: Unsteadiness on feet (R26.81);Muscle weakness (generalized) (M62.81);Other abnormalities of gait and mobility (R26.89)    Time: 1610-9604 PT Time Calculation (min) (ACUTE ONLY): 24 min  Charges:   PT Evaluation $PT Eval Moderate Complexity: 1 Mod PT Treatments $Therapeutic Activity: 23-37 mins PT General Charges $$ ACUTE PT VISIT: 1 Visit         2:10 PM, 03/13/23 Ocie Bob, MPT Physical Therapist with [facility] [phone] office [phone] mobile phone

## 2023-03-13 NOTE — Plan of Care (Signed)
  Problem: Acute Rehab OT Goals (only OT should resolve) Goal: Pt. Will Perform Eating Flowsheets (Taken 03/13/2023 1053) Pt Will Perform Eating: with modified independence Goal: Pt. Will Perform Grooming Flowsheets (Taken 03/13/2023 1053) Pt Will Perform Grooming:  with modified independence  sitting Goal: Pt. Will Perform Lower Body Bathing Flowsheets (Taken 03/13/2023 1053) Pt Will Perform Lower Body Bathing:  with modified independence  sitting/lateral leans Goal: Pt. Will Perform Upper Body Dressing Flowsheets (Taken 03/13/2023 1053) Pt Will Perform Upper Body Dressing:  with modified independence  sitting Goal: Pt. Will Perform Lower Body Dressing Flowsheets (Taken 03/13/2023 1053) Pt Will Perform Lower Body Dressing:  sitting/lateral leans  with contact guard assist Goal: Pt. Will Transfer To Toilet Flowsheets (Taken 03/13/2023 1053) Pt Will Transfer to Toilet:  with modified independence  stand pivot transfer Goal: Pt. Will Perform Toileting-Clothing Manipulation Flowsheets (Taken 03/13/2023 1053) Pt Will Perform Toileting - Clothing Manipulation and hygiene:  with modified independence  sitting/lateral leans Goal: Pt/Caregiver Will Perform Home Exercise Program Flowsheets (Taken 03/13/2023 1053) Pt/caregiver will Perform Home Exercise Program:  Increased strength  Both right and left upper extremity  Independently  Carianna Lague OT, MOT

## 2023-03-13 NOTE — Care Management Obs Status (Signed)
MEDICARE OBSERVATION STATUS NOTIFICATION   Patient Details  Name: [name] MRN: [id number] Date of Birth: 11/18/1940   Medicare Observation Status Notification Given:       Corey Harold 03/13/2023, 1:56 PM

## 2023-03-13 NOTE — NC FL2 (Signed)
[place] MEDICAID FL2 LEVEL OF CARE FORM     IDENTIFICATION  Patient Name: [name] Birthdate: 12/05/1940 Sex: female Admission Date (Current Location): Mar 28, 2023  [facility] and [place] Number:  [employer] and Address:  [facility],  [address] [place] [address]      Provider Number: [id number]  Attending Physician Name and Address:  Almon Hercules, MD  Relative Name and Phone Number:       Current Level of Care: Hospital Recommended Level of Care: Skilled Nursing Facility Prior Approval Number:    Date Approved/Denied:   PASRR Number: [id number]  Discharge Plan: SNF    Current Diagnoses: Patient Active Problem List   Diagnosis Date Noted   COVID-19 2023-03-28   Acute metabolic encephalopathy 03/02/2023   Altered mental state 02/28/2023   Generalized weakness 02/27/2023   Elevated ferritin 01/29/2023   Rectal bleed 01/09/2023   Acute blood loss anemia 06/22/2022   Hemothorax 06/22/2022   Hemothorax on left 06/21/2022   Loculated pleural effusion 06/21/2022   Need for management of chest tube 06/20/2022   Delirium 06/14/2022   GERD (gastroesophageal reflux disease) 06/10/2022   CAP (community acquired pneumonia) 06/10/2022   ESRD (end stage renal disease) (HCC) 06/09/2022   DMII (diabetes mellitus, type 2) (HCC) 06/09/2022   Multifocal pneumonia 06/02/2022   Pleural effusion 06/02/2022   End stage renal disease on dialysis [facility]) 06/02/2022   Spinal stenosis of lumbar region 01/07/2022   Schatzki's ring 10/15/2021   Esophageal dysphagia 10/15/2021   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   Hypertensive heart and CKD, 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/2020   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 due to type 2 diabetes mellitus (HCC) 11/09/2020   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 diabetes mellitus (HCC) 11/09/2020   Dependence on renal dialysis (HCC) 11/09/2020   Anemia due to end stage  renal disease (HCC) 11/09/2020   Hyperlipidemia associated with type 2 diabetes mellitus (HCC) 11/09/2020   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 10/26/2020   Hypertensive urgency 10/26/2020   History of CVA (cerebrovascular accident) 01/05/2020   Thrombocytopenia (HCC) 01/05/2020   History of colonic polyps 04/01/2018   Acute bronchitis 10/15/2017   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 (HCC)    HCAP (healthcare-associated pneumonia) 05/29/2015   Hyperkalemia 05/29/2015   ESRD (end stage renal disease) on dialysis (HCC)    Gastro-esophageal reflux disease without esophagitis 09/29/2014   Encounter for adequacy testing for hemodialysis (HCC) 01/17/2014   Mechanical complication of other vascular device, implant, and graft 01/17/2014   Disturbances of vision, late effect of stroke 11/03/2013   Right hemiparesis (HCC) 08/19/2013   CVA (cerebral infarction) 07/20/2013   Gastroparesis due to DM (HCC) 07/01/2013   Type 2 diabetes mellitus with hyperglycemia (HCC) 06/23/2013   Anemia 01/26/2013   Fall 05/25/2012   HLD (hyperlipidemia) 05/20/2006   Hereditary and idiopathic peripheral neuropathy 05/20/2006   Essential hypertension 05/20/2006    Orientation RESPIRATION BLADDER Height & Weight     Self, Situation, Place  Normal Continent Weight: 166 lb (75.3 kg) Height:  5\' 7"  (170.2 cm)  BEHAVIORAL SYMPTOMS/MOOD NEUROLOGICAL BOWEL NUTRITION STATUS  Continent Diet  AMBULATORY STATUS COMMUNICATION OF NEEDS Skin   Extensive Assist Verbally Normal                       Personal Care Assistance Level of Assistance  Bathing, Feeding, Dressing Bathing  Assistance: Limited assistance Feeding assistance: Independent Dressing Assistance: Limited assistance     Functional Limitations Info  Sight, Hearing, Speech Sight Info: Adequate Hearing Info: Adequate Speech Info: Adequate    SPECIAL CARE FACTORS FREQUENCY  PT (By licensed PT), OT (By licensed OT)     PT Frequency: 5 times weekly OT Frequency: 5 times weekly            Contractures Contractures Info: Not present    Additional Factors Info  Code Status, Allergies Code Status Info: FULL Allergies Info: Pregabalin, Ambien (Zolpidem), Reglan (Metoclopramide)           Current Medications (03/13/2023):  This is the current hospital active medication list Current Facility-Administered Medications  Medication Dose Route Frequency Provider Last Rate Last Admin   acetaminophen (TYLENOL) tablet 650 mg  650 mg Oral Q6H PRN Tyrone Nine, MD       Or   acetaminophen (TYLENOL) suppository 650 mg  650 mg Rectal Q6H PRN Tyrone Nine, MD       albuterol (PROVENTIL) (2.5 MG/3ML) 0.083% nebulizer solution 2.5 mg  2.5 mg Nebulization Q3H PRN Tyrone Nine, MD       ALPRAZolam Prudy Feeler) tablet 0.25 mg  0.25 mg Oral Daily Hazeline Junker B, MD   0.25 mg at 03/13/23 0901   aspirin EC tablet 81 mg  81 mg Oral Daily Tyrone Nine, MD   81 mg at 03/13/23 0901   dextromethorphan-guaiFENesin (MUCINEX DM) 30-600 MG per 12 hr tablet 1 tablet  1 tablet Oral BID Tyrone Nine, MD   1 tablet at 03/13/23 0901   heparin injection 5,000 Units  5,000 Units Subcutaneous Q8H Hazeline Junker B, MD   5,000 Units at 03/13/23 0505   HYDROcodone bit-homatropine (HYCODAN) 5-1.5 MG/5ML syrup 5 mL  5 mL Oral Q6H PRN Tyrone Nine, MD   5 mL at 2023/03/17 2138   insulin aspart (novoLOG) injection 0-5 Units  0-5 Units Subcutaneous QHS Hazeline Junker B, MD       insulin aspart (novoLOG) injection 0-9 Units  0-9 Units Subcutaneous TID WC Hazeline Junker B, MD   2 Units at 03/13/23 0900   ipratropium-albuterol (DUONEB) 0.5-2.5 (3)  MG/3ML nebulizer solution 3 mL  3 mL Nebulization BID Hazeline Junker B, MD   3 mL at 03/13/23 0845   labetalol (NORMODYNE) tablet 200 mg  200 mg Oral BID Hazeline Junker B, MD   200 mg at Mar 17, 2023 2138   linagliptin (TRADJENTA) tablet 5 mg  5 mg Oral Daily Hazeline Junker B, MD   5 mg at 03/13/23 0901   midodrine (PROAMATINE) tablet 5 mg  5 mg Oral TID WC Candelaria Stagers T, MD       multivitamin (RENA-VIT) tablet 1 tablet  1 tablet Oral QHS Tyrone Nine, MD   1 tablet at 17-Mar-2023 2138   pantoprazole (PROTONIX) EC tablet 40 mg  40 mg Oral Daily Tyrone Nine, MD   40 mg at 03/13/23 0901   predniSONE (DELTASONE) tablet 40 mg  40 mg Oral Q breakfast Hazeline Junker B, MD   40 mg at 03/13/23 0901   sodium chloride flush (NS) 0.9 % injection 3 mL  3  mL Intravenous Q12H Tyrone Nine, MD   3 mL at 03/13/23 0901     Discharge Medications: Please see discharge summary for a list of discharge medications.  Relevant Imaging Results:  Relevant Lab Results:   Additional Information SSN: [id number]. Dialysis: [facility] MWF 6am chair  [place], LCSWA

## 2023-03-13 NOTE — Plan of Care (Signed)
  Problem: Acute Rehab PT Goals(only PT should resolve) Goal: Pt Will Go Supine/Side To Sit Outcome: Progressing Flowsheets (Taken 03/13/2023 1411) Pt will go Supine/Side to Sit:  with minimal assist  with moderate assist Goal: Patient Will Transfer Sit To/From Stand Outcome: Progressing Flowsheets (Taken 03/13/2023 1411) Patient will transfer sit to/from stand:  with minimal assist  with moderate assist Goal: Pt Will Transfer Bed To Chair/Chair To Bed Outcome: Progressing Flowsheets (Taken 03/13/2023 1411) Pt will Transfer Bed to Chair/Chair to Bed:  with min assist  with mod assist Goal: Pt Will Ambulate Outcome: Progressing Flowsheets (Taken 03/13/2023 1411) Pt will Ambulate:  25 feet  with minimal assist  with moderate assist  with rolling walker   2:12 PM, 03/13/23 Ocie Bob, MPT Physical Therapist with [facility] [phone] office [phone] mobile phone

## 2023-03-13 NOTE — TOC Initial Note (Addendum)
Transition of Care [facility]) - Initial/Assessment Note    Patient Details  Name: [name] MRN: [id number] Date of Birth: 06/03/1941  Transition of Care [facility]) CM/SW Contact:    Villa Herb, LCSWA Phone Number: 03/13/2023, 10:59 AM  Clinical Narrative:                 CSW updated that PT is recommending SNF for pt at D/C. CSW spoke with pts daughter who states they are agreeable to SNF placement and prefer [facility]. CSW explained that referral can be sent to facilities for review. Insurance auth to be started. TOC to follow.   Addendum 2:50pm: CSW spoke with Revonda Standard at [facility] who states they can accept pt to private room on Monday 9/30. Jill Side states that they cannot transport pt to [facility] but they can take pt at [facility]. CSW spoke to [facility] who states they can work on a temporary transfer to their [place] [facility] for HD TThS while pt is at [facility]. CSW spoke to pts daughter to update on this, she is understanding and agreeable. States she has spoken with family and they are all in agreement. Insurance Berkley Harvey has been started and is currently pending. TOC to follow.   Expected Discharge Plan: Skilled Nursing Facility Barriers to Discharge: Continued Medical Work up   Patient Goals and CMS Choice Patient states their goals for this hospitalization and ongoing recovery are:: go to SNF CMS Medicare.gov Compare Post Acute Care list provided to:: Patient Represenative (must comment) Choice offered to / list presented to : Patient, Adult Children [place] ownership interest in [facility].provided to:: Adult Children    Expected Discharge Plan and Services In-house Referral: Clinical Social Work Discharge Planning Services: CM Consult Post Acute Care Choice: Skilled Nursing Facility Living arrangements for the past 2 months: Single Family Home                                      Prior Living  Arrangements/Services Living arrangements for the past 2 months: Single Family Home Lives with:: Self Patient language and need for interpreter reviewed:: Yes Do you feel safe going back to the place where you live?: Yes      Need for Family Participation in Patient Care: Yes (Comment) Care giver support system in place?: Yes (comment) Current home services: DME Criminal Activity/Legal Involvement Pertinent to Current Situation/Hospitalization: No - Comment as needed  Activities of Daily Living Home Assistive Devices/Equipment: CBG Meter, Cane (specify quad or straight), Wheelchair ADL Screening (condition at time of admission) Does the patient have a NEW difficulty with bathing/dressing/toileting/self-feeding that is expected to last >3 days?: No Does the patient have a NEW difficulty with getting in/out of bed, walking, or climbing stairs that is expected to last >3 days?: No Does the patient have a NEW difficulty with communication that is expected to last >3 days?: No Is the patient deaf or have difficulty hearing?: Yes Does the patient have difficulty seeing, even when wearing glasses/contacts?: No Does the patient have difficulty concentrating, remembering, or making decisions?: Yes  Permission Sought/Granted                  Emotional Assessment Appearance:: Appears stated age Attitude/Demeanor/Rapport: Engaged Affect (typically observed): Accepting Orientation: : Oriented to Self, Oriented to Situation, Oriented to Place Alcohol / Substance Use: Not Applicable Psych Involvement: No (comment)  Admission diagnosis:  COVID-19 [U07.1] Patient Active Problem List   Diagnosis Date Noted   COVID-19 03/15/23   Acute metabolic encephalopathy 03/02/2023   Altered mental state 02/28/2023   Generalized weakness 02/27/2023   Elevated ferritin 01/29/2023   Rectal bleed 01/09/2023   Acute blood loss anemia 06/22/2022   Hemothorax 06/22/2022   Hemothorax on left 06/21/2022    Loculated pleural effusion 06/21/2022   Need for management of chest tube 06/20/2022   Delirium 06/14/2022   GERD (gastroesophageal reflux disease) 06/10/2022   CAP (community acquired pneumonia) 06/10/2022   ESRD (end stage renal disease) (HCC) 06/09/2022   DMII (diabetes mellitus, type 2) (HCC) 06/09/2022   Multifocal pneumonia 06/02/2022   Pleural effusion 06/02/2022   End stage renal disease on dialysis (HCC) 06/02/2022   Spinal stenosis of lumbar region 01/07/2022   Schatzki's ring 10/15/2021   Esophageal dysphagia 10/15/2021   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   Hypertensive heart and CKD, 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/2020   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 due to type 2 diabetes mellitus (HCC) 11/09/2020   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 diabetes mellitus (HCC) 11/09/2020   Dependence on renal dialysis (HCC) 11/09/2020   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 disease (HCC) 11/09/2020   Hyperlipidemia associated with type 2 diabetes mellitus (HCC) 11/09/2020   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 10/26/2020   Hypertensive urgency 10/26/2020   History of CVA (cerebrovascular accident) 01/05/2020   Thrombocytopenia (HCC) 01/05/2020   History of colonic polyps 04/01/2018   Acute bronchitis 10/15/2017   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 (HCC)    HCAP (healthcare-associated pneumonia) 05/29/2015   Hyperkalemia 05/29/2015   ESRD (end stage renal disease) on dialysis (HCC)    Gastro-esophageal reflux disease without esophagitis 09/29/2014   Encounter for adequacy testing for hemodialysis (HCC) 01/17/2014    Mechanical complication of other vascular device, implant, and graft 01/17/2014   Disturbances of vision, late effect of stroke 11/03/2013   Right hemiparesis (HCC) 08/19/2013   CVA (cerebral infarction) 07/20/2013   Gastroparesis due to DM (HCC) 07/01/2013   Type 2 diabetes mellitus with hyperglycemia (HCC) 06/23/2013   Anemia 01/26/2013   Fall 05/25/2012   HLD (hyperlipidemia) 05/20/2006   Hereditary and idiopathic peripheral neuropathy 05/20/2006   Essential hypertension 05/20/2006   PCP:  Mirna Mires, MD Pharmacy:   [facility] - [place], [place] - [address] [place] [address] Phone: [phone] Fax: [phone]  [id number] - [place], [place] - [address] AT [facility] [address] [place] [address] Phone: [phone] Fax: [phone]     Social Determinants of Health (SDOH) Social History: SDOH Screenings   Food Insecurity: No Food Insecurity (Mar 15, 2023)  Housing: Patient Declined (03-15-23)  Transportation Needs: No Transportation Needs (15-Mar-2023)  Utilities: Not At Risk (03/15/2023)  Depression (PHQ2-9): High Risk (02/17/2023)  Tobacco Use: Low Risk  (03/15/2023)   SDOH Interventions:     Readmission Risk Interventions    06/12/2022    3:57 PM 06/10/2022   11:01 AM  Readmission Risk Prevention Plan  Transportation Screening Complete Complete  HRI or Home Care Consult  Complete Complete  Social Work Consult for Recovery Care Planning/Counseling Complete Complete  Palliative Care Screening Not Applicable Not Applicable  Medication Review Oceanographer) Complete Complete

## 2023-03-13 NOTE — Care Management Obs Status (Signed)
MEDICARE OBSERVATION STATUS NOTIFICATION   Patient Details  Name: KELITA WALLIS MRN: [id number] Date of Birth: [birth year]   Medicare Observation Status Notification Given:  Yes    Corey Harold 03/13/2023, 1:57 PM

## 2023-03-14 ENCOUNTER — Observation Stay (HOSPITAL_COMMUNITY): Payer: 59

## 2023-03-14 DIAGNOSIS — E11649 Type 2 diabetes mellitus with hypoglycemia without coma: Secondary | ICD-10-CM | POA: Diagnosis present

## 2023-03-14 DIAGNOSIS — Z992 Dependence on renal dialysis: Secondary | ICD-10-CM | POA: Diagnosis not present

## 2023-03-14 DIAGNOSIS — I120 Hypertensive chronic kidney disease with stage 5 chronic kidney disease or end stage renal disease: Secondary | ICD-10-CM | POA: Diagnosis present

## 2023-03-14 DIAGNOSIS — J90 Pleural effusion, not elsewhere classified: Secondary | ICD-10-CM | POA: Diagnosis not present

## 2023-03-14 DIAGNOSIS — U071 COVID-19: Secondary | ICD-10-CM | POA: Diagnosis not present

## 2023-03-14 DIAGNOSIS — E1165 Type 2 diabetes mellitus with hyperglycemia: Secondary | ICD-10-CM | POA: Diagnosis not present

## 2023-03-14 DIAGNOSIS — G9341 Metabolic encephalopathy: Secondary | ICD-10-CM | POA: Diagnosis present

## 2023-03-14 DIAGNOSIS — R1319 Other dysphagia: Secondary | ICD-10-CM | POA: Diagnosis not present

## 2023-03-14 DIAGNOSIS — E785 Hyperlipidemia, unspecified: Secondary | ICD-10-CM | POA: Diagnosis present

## 2023-03-14 DIAGNOSIS — R1314 Dysphagia, pharyngoesophageal phase: Secondary | ICD-10-CM | POA: Diagnosis present

## 2023-03-14 DIAGNOSIS — N186 End stage renal disease: Secondary | ICD-10-CM | POA: Diagnosis not present

## 2023-03-14 DIAGNOSIS — I469 Cardiac arrest, cause unspecified: Secondary | ICD-10-CM

## 2023-03-14 DIAGNOSIS — J989 Respiratory disorder, unspecified: Secondary | ICD-10-CM | POA: Diagnosis not present

## 2023-03-14 DIAGNOSIS — I6930 Unspecified sequelae of cerebral infarction: Secondary | ICD-10-CM | POA: Diagnosis not present

## 2023-03-14 DIAGNOSIS — R0989 Other specified symptoms and signs involving the circulatory and respiratory systems: Secondary | ICD-10-CM | POA: Diagnosis not present

## 2023-03-14 DIAGNOSIS — R7989 Other specified abnormal findings of blood chemistry: Secondary | ICD-10-CM | POA: Diagnosis not present

## 2023-03-14 DIAGNOSIS — E8889 Other specified metabolic disorders: Secondary | ICD-10-CM | POA: Diagnosis present

## 2023-03-14 DIAGNOSIS — K219 Gastro-esophageal reflux disease without esophagitis: Secondary | ICD-10-CM | POA: Diagnosis not present

## 2023-03-14 DIAGNOSIS — D72810 Lymphocytopenia: Secondary | ICD-10-CM | POA: Diagnosis present

## 2023-03-14 DIAGNOSIS — E872 Acidosis, unspecified: Secondary | ICD-10-CM | POA: Diagnosis present

## 2023-03-14 DIAGNOSIS — D696 Thrombocytopenia, unspecified: Secondary | ICD-10-CM | POA: Diagnosis not present

## 2023-03-14 DIAGNOSIS — D631 Anemia in chronic kidney disease: Secondary | ICD-10-CM | POA: Diagnosis present

## 2023-03-14 DIAGNOSIS — R0682 Tachypnea, not elsewhere classified: Secondary | ICD-10-CM

## 2023-03-14 DIAGNOSIS — R0602 Shortness of breath: Secondary | ICD-10-CM | POA: Diagnosis present

## 2023-03-14 DIAGNOSIS — I10 Essential (primary) hypertension: Secondary | ICD-10-CM | POA: Diagnosis not present

## 2023-03-14 DIAGNOSIS — I959 Hypotension, unspecified: Secondary | ICD-10-CM | POA: Diagnosis present

## 2023-03-14 DIAGNOSIS — J45901 Unspecified asthma with (acute) exacerbation: Secondary | ICD-10-CM | POA: Diagnosis present

## 2023-03-14 DIAGNOSIS — Z794 Long term (current) use of insulin: Secondary | ICD-10-CM | POA: Diagnosis not present

## 2023-03-14 DIAGNOSIS — J9811 Atelectasis: Secondary | ICD-10-CM | POA: Diagnosis not present

## 2023-03-14 DIAGNOSIS — E8770 Fluid overload, unspecified: Secondary | ICD-10-CM | POA: Diagnosis present

## 2023-03-14 DIAGNOSIS — E1122 Type 2 diabetes mellitus with diabetic chronic kidney disease: Secondary | ICD-10-CM | POA: Diagnosis not present

## 2023-03-14 LAB — COMPREHENSIVE METABOLIC PANEL
ALT: 176 U/L — ABNORMAL HIGH (ref 0–44)
AST: 468 U/L — ABNORMAL HIGH (ref 15–41)
Albumin: 2.9 g/dL — ABNORMAL LOW (ref 3.5–5.0)
Alkaline Phosphatase: 88 U/L (ref 38–126)
Anion gap: 21 — ABNORMAL HIGH (ref 5–15)
BUN: 24 mg/dL — ABNORMAL HIGH (ref 8–23)
CO2: 19 mmol/L — ABNORMAL LOW (ref 22–32)
Calcium: 7.9 mg/dL — ABNORMAL LOW (ref 8.9–10.3)
Chloride: 93 mmol/L — ABNORMAL LOW (ref 98–111)
Creatinine, Ser: 3.36 mg/dL — ABNORMAL HIGH (ref 0.44–1.00)
GFR, Estimated: 13 mL/min — ABNORMAL LOW (ref >60)
Glucose, Bld: 124 mg/dL — ABNORMAL HIGH (ref 70–99)
Potassium: 4.8 mmol/L (ref 3.5–5.1)
Sodium: 133 mmol/L — ABNORMAL LOW (ref 135–145)
Total Bilirubin: 2.1 mg/dL — ABNORMAL HIGH (ref 0.3–1.2)
Total Protein: 6.1 g/dL — ABNORMAL LOW (ref 6.5–8.1)

## 2023-03-14 LAB — CBC
HCT: 34.2 % — ABNORMAL LOW (ref 36.0–46.0)
HCT: 33.3 % — ABNORMAL LOW (ref 36.0–46.0)
Hemoglobin: 11.7 g/dL — ABNORMAL LOW (ref 12.0–15.0)
Hemoglobin: 10.7 g/dL — ABNORMAL LOW (ref 12.0–15.0)
MCH: 31.9 pg (ref 26.0–34.0)
MCH: 31.8 pg (ref 26.0–34.0)
MCHC: 34.2 g/dL (ref 30.0–36.0)
MCHC: 32.1 g/dL (ref 30.0–36.0)
MCV: 93.2 fL (ref 80.0–100.0)
MCV: 98.8 fL (ref 80.0–100.0)
Platelets: 103 10*3/uL — ABNORMAL LOW (ref 150–400)
Platelets: 61 10*3/uL — ABNORMAL LOW (ref 150–400)
RBC: 3.67 MIL/uL — ABNORMAL LOW (ref 3.87–5.11)
RBC: 3.37 MIL/uL — ABNORMAL LOW (ref 3.87–5.11)
RDW: 20.1 % — ABNORMAL HIGH (ref 11.5–15.5)
RDW: 20.1 % — ABNORMAL HIGH (ref 11.5–15.5)
WBC: 15.3 10*3/uL — ABNORMAL HIGH (ref 4.0–10.5)
WBC: 16.5 10*3/uL — ABNORMAL HIGH (ref 4.0–10.5)
nRBC: 1.7 % — ABNORMAL HIGH (ref 0.0–0.2)
nRBC: 2.7 % — ABNORMAL HIGH (ref 0.0–0.2)

## 2023-03-14 LAB — RENAL FUNCTION PANEL
Albumin: 3.2 g/dL — ABNORMAL LOW (ref 3.5–5.0)
Anion gap: 15 (ref 5–15)
BUN: 22 mg/dL (ref 8–23)
CO2: 25 mmol/L (ref 22–32)
Calcium: 7.8 mg/dL — ABNORMAL LOW (ref 8.9–10.3)
Chloride: 94 mmol/L — ABNORMAL LOW (ref 98–111)
Creatinine, Ser: 3.03 mg/dL — ABNORMAL HIGH (ref 0.44–1.00)
GFR, Estimated: 15 mL/min — ABNORMAL LOW (ref >60)
Glucose, Bld: 110 mg/dL — ABNORMAL HIGH (ref 70–99)
Phosphorus: 3.7 mg/dL (ref 2.5–4.6)
Potassium: 4.3 mmol/L (ref 3.5–5.1)
Sodium: 134 mmol/L — ABNORMAL LOW (ref 135–145)

## 2023-03-14 LAB — BLOOD GAS, VENOUS
Acid-base deficit: 0.7 mmol/L (ref 0.0–2.0)
Bicarbonate: 25.8 mmol/L (ref 20.0–28.0)
Drawn by: 27160
O2 Saturation: 27.1 %
Patient temperature: 36.7
pCO2, Ven: 48 mm[Hg] (ref 44–60)
pH, Ven: 7.33 (ref 7.25–7.43)
pO2, Ven: <31 mm[Hg] — CL (ref 32–45)

## 2023-03-14 LAB — GLUCOSE, CAPILLARY
Glucose-Capillary: 108 mg/dL — ABNORMAL HIGH (ref 70–99)
Glucose-Capillary: 40 mg/dL — CL (ref 70–99)
Glucose-Capillary: 80 mg/dL (ref 70–99)

## 2023-03-14 LAB — MAGNESIUM
Magnesium: 1.8 mg/dL (ref 1.7–2.4)
Magnesium: 2.3 mg/dL (ref 1.7–2.4)

## 2023-03-14 LAB — LACTIC ACID, PLASMA: Lactic Acid, Venous: >9 mmol/L (ref 0.5–1.9)

## 2023-03-14 LAB — VITAMIN B12: Vitamin B-12: 4238 pg/mL — ABNORMAL HIGH (ref 180–914)

## 2023-03-14 LAB — ECHOCARDIOGRAM LIMITED
Est EF: <20
Height: 67 in
Weight: 2652.57 [oz_av]

## 2023-03-14 LAB — HEPATITIS B SURFACE ANTIBODY, QUANTITATIVE: Hep B S AB Quant (Post): 23.4 m[IU]/mL

## 2023-03-14 LAB — AMMONIA: Ammonia: 28 umol/L (ref 9–35)

## 2023-03-14 LAB — TSH: TSH: 2.672 u[IU]/mL (ref 0.350–4.500)

## 2023-03-14 MED ORDER — DEXMEDETOMIDINE HCL IN NACL 400 MCG/100ML IV SOLN: 0.0000 ug/kg/h | INTRAVENOUS | Status: DC

## 2023-03-14 MED ORDER — DEXMEDETOMIDINE HCL IN NACL 200 MCG/50ML IV SOLN: 0.0000 ug/kg/h | INTRAVENOUS | Status: DC

## 2023-03-14 MED ORDER — GLUCAGON HCL RDNA (DIAGNOSTIC) 1 MG IJ SOLR
INTRAMUSCULAR | Status: AC
  Filled 2023-03-14: qty 1

## 2023-03-14 MED ORDER — DOCUSATE SODIUM 50 MG/5ML PO LIQD
100.0000 mg | Freq: Two times a day (BID) | ORAL | Status: DC
  Filled 2023-03-14 (×4): qty 10

## 2023-03-14 MED ORDER — POLYETHYLENE GLYCOL 3350 17 G PO PACK: 17.0000 g | PACK | Freq: Every day | ORAL | Status: DC

## 2023-03-14 MED ORDER — PANTOPRAZOLE SODIUM 40 MG IV SOLR: 40.0000 mg | INTRAVENOUS | Status: DC

## 2023-03-14 MED ORDER — MIDODRINE HCL 5 MG PO TABS: 10.0000 mg | ORAL_TABLET | Freq: Three times a day (TID) | ORAL | Status: DC

## 2023-03-14 MED ORDER — EPINEPHRINE 1 MG/10ML IJ SOSY
PREFILLED_SYRINGE | INTRAMUSCULAR | Status: AC
  Filled 2023-03-14: qty 20

## 2023-03-14 MED ORDER — GUAIFENESIN 100 MG/5ML PO LIQD
5.0000 mL | ORAL | Status: DC | PRN
  Administered 2023-03-14: 5 mL via ORAL
  Filled 2023-03-14: qty 5

## 2023-03-14 MED ORDER — DEXTROSE 50 % IV SOLN
INTRAVENOUS | Status: AC
  Filled 2023-03-14: qty 50

## 2023-03-14 MED ORDER — NOREPINEPHRINE 4 MG/250ML-% IV SOLN: 0.0000 ug/min | INTRAVENOUS | Status: DC

## 2023-03-14 MED ORDER — INSULIN ASPART 100 UNIT/ML IJ SOLN: 0.0000 [IU] | INTRAMUSCULAR | Status: DC

## 2023-03-14 MED ORDER — LORAZEPAM 2 MG/ML IJ SOLN: 1.0000 mg | Freq: Once | INTRAMUSCULAR | Status: DC

## 2023-03-15 LAB — RPR: RPR Ser Ql: NONREACTIVE

## 2023-03-16 MED FILL — Medication: Qty: 1 | Status: AC

## 2023-03-17 NOTE — Progress Notes (Signed)
*  PRELIMINARY RESULTS* Echocardiogram Limited 2-D Echocardiogram  has been performed. Patient was coded during echocardiogram.   Stacey Drain 30-Mar-2023, 1:17 PM

## 2023-03-17 NOTE — ED Provider Notes (Signed)
I responded to a CODE BLUE alert on this patient who was admitted with COVID encephalopathy.  On my arrival in patient room, CPR had been initiated.  Respirations were assisted with BVM.  Patient had PEA on monitor.  I assumed BVM respirations.  Patient was intubated as per procedure note below.  Following approximately 20 minutes of CPR and four 1 mg injections of epinephrine, patient had ROSC.  Procedure Name: Intubation Date/Time: 03/10/2023 11:56 AM  Performed by: Gloris Manchester, MDPre-anesthesia Checklist: Suction available and Patient being monitored Oxygen Delivery Method: Ambu bag Preoxygenation: Pre-oxygenation with 100% oxygen Ventilation: Mask ventilation without difficulty Laryngoscope Size: Glidescope Grade View: Grade I Tube size: 7.5 mm Number of attempts: 1 Airway Equipment and Method: Video-laryngoscopy Placement Confirmation: CO2 detector and Breath sounds checked- equal and bilateral Secured at: 21 cm Tube secured with: Tape Dental Injury: Teeth and Oropharynx as per pre-operative assessment          Gloris Manchester, MD 02/15/2023 1157

## 2023-03-17 NOTE — Progress Notes (Signed)
03/09/2023 0900  Intake (mL)  P.O. 0 mL  Percent Meals Eaten (%) 0 % (patient  not following commands to eat breakfast. Morning medication crushed and administered with difficulties. needing frequent cues to open mouth and swallow)

## 2023-03-17 NOTE — Death Summary Note (Signed)
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)   Thrombocytopenia (HCC)   Elevated troponin   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 disease (HCC)   Esophageal dysphagia   Gastro-esophageal reflux disease without esophagitis   Type 2 diabetes mellitus with hyperglycemia (HCC)   Acute metabolic encephalopathy   Cardiac arrest (HCC)   Respiration disorder   Tachypnea   Hospital Course: 82 year old F with PMH of IDDM-2, ESRD on HD MWF, CVA with residual deficit and dysphagia, HTN,  GERD and recent hospitalization from 9/13-9/17 for generalized weakness returning to ED through urgent care due to shortness of breath, cough and generalized weakness and admitted for asthma exacerbation in the setting of COVID-19 infection and generalized weakness.  COVID-19 PCR positive.  CXR without acute finding.  WBC 13.5 with left shift and lymphopenia.   Patient went into cardiac arrest with asystole the morning of 04-13-23.  ROSC was achieved after 20 minutes of CPR and 4 rounds of epinephrine.  She was hypoglycemic to 40 and received glucagon.  CBG improved to 124.  Intubated by ED provider, and transferred to ICU for mechanical ventilation and further care.  Patient's daughter, [name] was notified over the phone while CPR was going on.  Lab work including CBC and CMP after CPR significant for elevated LFT, lactic acidosis and leukocytosis.  VBG from earlier this morning without significant  finding.  Few minutes after arrival to ICU, patient had PEA arrest while getting echocardiogram and CODE BLUE activated and CPR restarted.   She received epinephrine x 1.  While CPR was going on, multiple family members including children and grandchildren arrived.  After brief family meeting, patient's daughter, [name] stated that her mother had expressed her desire not to undergo CPR and intubation about a week ago.  [name] did not discuss this with the rest of family.  After listening to [name], family decided to honor patient's desire and wish to die peacefully.  CPR stopped. Patient passed away.  Pronounced dead about 12:40 PM.   Procedures: Intubation and mechanical ventilation  Consultations: Nephrology  The results of significant diagnostics from this hospitalization (including imaging, microbiology, ancillary and laboratory) are listed below for reference.   Significant Diagnostic Studies: ECHOCARDIOGRAM LIMITED  Result Date: 13-Apr-2023    ECHOCARDIOGRAM REPORT   Patient Name:   [name] Date of Exam: 04-13-23 Medical Rec #:  [id number]          Height:       67.0 in Accession #:    [id number]         Weight:       165.8 lb Date of Birth:  02/02/1941          BSA:          1.867 m Patient Age:    82 years           BP:           117/5 mmHg Patient  Gender: F                  HR:           54 bpm. [facility]am [facility] Procedure: Limited Echo Indications:    Cardiac Arrest l46.9  History:        Patient has prior history of Echocardiogram examinations, most                 recent 07/21/2013. Stroke; Risk Factors:Hypertension, Diabetes and                 Dyslipidemia. Elevated troponin. COVID-19. ESRD (end stage renal                 disease) on dialysis (HCC).  Sonographer:    Celesta Gentile RCS Referring Phys: [id number] Boyce Medici Rennee Coyne IMPRESSIONS  1. This study is incomplete. . Left ventricular ejection fraction, by estimation, is <20%. The left ventricle has severely  decreased function.  2. Right ventricular systolic function is severely reduced.  3. Moderate pericardial effusion. Conclusion(s)/Recommendation(s): Patient is comfort care. FINDINGS  Left Ventricle: This study is incomplete. Left ventricular ejection fraction, by estimation, is <20%. The left ventricle has severely decreased function. Right Ventricle: Right ventricular systolic function is severely reduced. Pericardium: A moderately sized pericardial effusion is present. Carolan Clines Electronically signed by Carolan Clines Signature Date/Time: 03-16-2023/1:50:18 PM    Final    DG Chest Port 1 View  Result Date: 03/16/23 CLINICAL DATA:  Cardiac arrest.  Status post intubation. EXAM: PORTABLE CHEST 1 VIEW COMPARISON:  02/20/2023 FINDINGS: Endotracheal tube tip is 1.7 cm above the base of the carina. The NG tube passes into the stomach although the distal tip position is not included on the film. The cardio pericardial silhouette is enlarged. Vascular congestion with left base atelectasis again noted. Small left pleural effusion. Telemetry leads overlie the chest. IMPRESSION: 1. Endotracheal tube tip is 1.7 cm above the base of the carina. 2. Vascular congestion with left base atelectasis and small left effusion. Electronically Signed   By: Kennith Center M.D.   On: March 16, 2023 12:32   DG Chest Portable 1 View  Result Date: 02/16/2023 CLINICAL DATA:  Shortness of breath.  Nonproductive cough. EXAM: PORTABLE CHEST 1 VIEW COMPARISON:  Chest radiograph 02/27/2023. FINDINGS: Interval resolution of small bilateral pleural effusions. No consolidation or pulmonary edema. Slightly increased peripheral opacity in the left lung, likely atelectasis. No pneumothorax. Stable cardiac and mediastinal contours. IMPRESSION: Interval resolution of small bilateral pleural effusions. Slightly increased peripheral opacity in the left lung, likely atelectasis. Electronically Signed   By: Orvan Falconer M.D.   On: 03/13/2023 14:21    MR BRAIN WO CONTRAST  Result Date: 02/27/2023 CLINICAL DATA:  Provided history: Neuro deficit, acute, stroke suspected. EXAM: MRI HEAD WITHOUT CONTRAST TECHNIQUE: Multiplanar, multiecho pulse sequences of the brain and surrounding structures were obtained without intravenous contrast. COMPARISON:  Head CT 02/27/2023.  Brain MRI 01/10/2020. FINDINGS: Mild intermittent motion degradation. Brain: Generalized parenchymal atrophy. Redemonstrated small chronic cortical/subcortical infarct within the left parietal lobe. Advanced patchy and confluent T2 FLAIR hyperintense signal abnormality within the cerebral white matter, nonspecific but compatible with chronic small vessel ischemic disease. There are superimposed chronic white matter infarcts within the bilateral cerebral hemispheres, the majority of which were better appreciated on the prior brain MRI of 08/20/2013 (acute at that time). Moderate chronic small vessel ischemic changes within the pons. Multiple chronic infarcts within the cerebellum, the largest within the left  hemisphere measuring 13 mm (series 10, image 6). There are a few punctate chronic microhemorrhages scattered within the supratentorial brain and cerebellum. There is no acute infarct. No evidence of an intracranial mass. No extra-axial fluid collection. No midline shift. Vascular: Maintained flow voids within the proximal large arterial vessels. Skull and upper cervical spine: No focal suspicious marrow lesion. Sinuses/Orbits: No mass or acute finding within the imaged orbits. Prior bilateral ocular lens replacement. No significant paranasal sinus disease. IMPRESSION: 1. Mildly motion degraded exam. 2. No evidence of an acute intracranial abnormality. 3. Redemonstrated small chronic cortical/subcortical infarct within the left parietal lobe. 4. Extensive chronic ischemic gliosis within the cerebral white matter, similar to the prior brain MRI of 01/10/2020. Moderate chronic small vessel  ischemic changes within the pons, also similar to this prior exam. 5. Chronic infarcts within bilateral cerebral hemispheres, unchanged. 6. Generalized parenchymal atrophy. Electronically Signed   By: Jackey Loge D.O.   On: 02/27/2023 13:23   CT Head Wo Contrast  Result Date: 02/27/2023 CLINICAL DATA:  Neuro deficit, acute, stroke suspected. Lower extremity weakness. EXAM: CT HEAD WITHOUT CONTRAST TECHNIQUE: Contiguous axial images were obtained from the base of the skull through the vertex without intravenous contrast. RADIATION DOSE REDUCTION: This exam was performed according to the departmental dose-optimization program which includes automated exposure control, adjustment of the mA and/or kV according to patient size and/or use of iterative reconstruction technique. COMPARISON:  Head CT 06/14/2022. FINDINGS: Brain: No acute hemorrhage. Unchanged severe chronic small-vessel disease with old infarct in the left superior parietal lobe. Cortical gray-white differentiation is otherwise preserved. Prominence of the ventricles and sulci within expected range for age. No hydrocephalus or extra-axial collection. No mass effect or midline shift. Vascular: No hyperdense vessel or unexpected calcification. Skull: No calvarial fracture or suspicious bone lesion. Skull base is unremarkable. Sinuses/Orbits: No acute finding. Other: None. IMPRESSION: 1. No acute intracranial abnormality. 2. Unchanged severe chronic small-vessel disease with old infarct in the left superior parietal lobe. Electronically Signed   By: Orvan Falconer M.D.   On: 02/27/2023 08:34   DG Chest 2 View  Result Date: 02/27/2023 CLINICAL DATA:  Dyspnea. EXAM: CHEST - 2 VIEW COMPARISON:  July 04, 2022. FINDINGS: Stable cardiomegaly. Small bilateral pleural effusions are noted with associated bibasilar subsegmental atelectasis. Bony thorax is unremarkable. IMPRESSION: Small pleural effusions with associated bibasilar subsegmental atelectasis.  Electronically Signed   By: Lupita Raider M.D.   On: 02/27/2023 08:28    Microbiology: Recent Results (from the past 240 hour(s))  SARS Coronavirus 2 by RT PCR (hospital order, performed in [facility] hospital lab) *cepheid single result test* Anterior Nasal Swab     Status: Abnormal   Collection Time: 02/26/2023 12:45 PM   Specimen: Anterior Nasal Swab  Result Value Ref Range Status   SARS Coronavirus 2 by RT PCR POSITIVE (A) NEGATIVE Final    Comment: (NOTE) SARS-CoV-2 target nucleic acids are DETECTED  SARS-CoV-2 RNA is generally detectable in upper respiratory specimens  during the acute phase of infection.  Positive results are indicative  of the presence of the identified virus, but do not rule out bacterial infection or co-infection with other pathogens not detected by the test.  Clinical correlation with patient history and  other diagnostic information is necessary to determine patient infection status.  The expected result is negative.  Fact Sheet for Patients:   RoadLapTop.co.za   Fact Sheet for Healthcare Providers:   http://kim-miller.com/    This test is not yet  approved or cleared by the [place] and  has been authorized for detection and/or diagnosis of SARS-CoV-2 by FDA under an Emergency Use Authorization (EUA).  This EUA will remain in effect (meaning this test can be used) for the duration of  the COVID-19 declaration under Section 564(b)(1)  of the Act, 21 U.S.C. section 360-bbb-3(b)(1), unless the authorization is terminated or revoked sooner.   Performed at [facility], [address] [place], [place] [address]     Time spent: 45 minutes  Signed: Almon Hercules, MD 03/15/2023

## 2023-03-17 NOTE — Progress Notes (Signed)
Went to patient room to replace PIV that patient had pulled out earlier. Found patient unresponsive with no pulse. Code blue called. Resuscitated per ACLS protocol and transferred to ICU. Daughter updated by Dr Alanda Slim.

## 2023-03-17 NOTE — Progress Notes (Signed)
Patient coded again about 12:25 PM with what looks like PEA arrest while getting echocardiogram.  CODE BLUE activated.  CPR restarted and she received Levophed.  Multiple family members including daughters, grandchildren and son-in-law's arrived.  Per daughter, Gershon Cull, patient expressed her desire not to go through cardiopulmonary resuscitation or intubation to her about a week ago that she did not share with the rest of the family.  Gershon Cull and the rest of family members like to stop CPR in order to honor patient's desire and wish of dying peacefully.  Family members understandably tearful.  CPR was stopped.  Patient pronounced dead at about 12:40 PM.   I offered chaplain consult for emotional support but patient's daughter calling her pastor.

## 2023-03-17 NOTE — Progress Notes (Signed)
PROGRESS NOTE  [name] NWG:[id number] DOB: 02/27/1941   PCP: Mirna Mires, MD  Patient is from: Home.  DOA: 02/20/2023 LOS: 0  Chief complaints Chief Complaint  Patient presents with   Shortness of Breath     Brief Narrative / Interim history: 82 year old F with PMH of IDDM-2, ESRD on HD MWF, CVA with residual deficit and dysphagia, HTN,  GERD and recent hospitalization from 9/13-9/17 for generalized weakness returning to ED through urgent care due to shortness of breath, cough and generalized weakness and admitted for asthma exacerbation in the setting of COVID-19 infection and generalized weakness.  COVID-19 PCR positive.  CXR without acute finding.  WBC 13.5 with left shift and lymphopenia.  Patient went into cardiac arrest with asystole the morning of Apr 10, 2023.  ROSC was achieved after 20 minutes of CPR and 4 rounds of epinephrine.  She was hypoglycemic to 40 and received glucagon.  CBG improved to 124.  Intubated and transferred to ICU.   Subjective: Seen and examined earlier and later this morning.  Patient was encephalopathic and minimally responsive to questions.  She was otherwise awake.  Later in the morning, she went into cardiac arrest with asystole about 11:25 AM.  CODE BLUE initiated.  ROSC after 20 minutes of CPR and 4 rounds of epinephrine.  She was intubated and transferred to ICU.  Patient's daughter notified about cardiac arrest and CPR.  She is on the way to the hospital.  Objective: Vitals:   03/13/23 2230 03/13/23 2300 04-10-23 0506 04/10/2023 0900  BP: 100/67 130/65 (!) 93/58 (!) 96/48  Pulse: 81 80 93 88  Resp: 16 18 18    Temp:  98 F (36.7 C) 98 F (36.7 C)   TempSrc:  Oral    SpO2: 100% 100% 100% 100%  Weight:  75.2 kg    Height:        Examination:  GENERAL: Somnolent after CPR. HEENT: MMM.  Intubated. NECK: Supple.  Prominent JVD RESP: Intubated. CVS: Regular rhythm after cardiac arrest. ABD/GI/GU: BS+. Abd soft MSK/EXT: Currently  somnolent and sedated. SKIN: no apparent skin lesion or wound NEURO: Currently somnolent and sedated.  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  ROSC after 20 minutes of CPR and 4 rounds of epi.  Intubated and transferred to ICU for mechanical ventilation.  Unclear cause of cardiac arrest.  She was hypoglycemic to 40 and improved to 124 after glucagon dextrose.  No significant electrolyte abnormality on morning labs.  Her VBG was not impressive earlier this morning.  She has prominent JVD on exam. -Transfer to ICU for mechanical ventilation -Precedex for sedation for goal RASS of 0 -CMP, magnesium, ABG, lactic acid, twelve-lead EKG and echocardiogram -May need VQ scan to rule out PE -Will have goal of care discussion with daughter when she arrives. -Likely transfer to [facility] after goal of care  discussion  Asthma exacerbation due to COVID-19 infection: No oxygen requirement or respiratory distress.  CXR without acute finding.  Reportedly wheezing on admission. -Ventilator support as above.  Acute metabolic encephalopathy: Initially felt to be due to COVID-19.  She was more obtunded this morning.  VBG, TSH and ammonia unrevealing.  He has no focal neurodeficit but limited exam due to encephalopathy -Will get CT  head and MRI when able -Spot EEG -ABG  Uncontrolled DM-2 with hypo and hyperglycemia and ESRD: A1c 10.4% on 7/26. Recent Labs  Lab 03/13/23 1128 03/13/23 1658 03/13/23 2110 04-10-23 0736 10-Apr-2023 1126  GLUCAP 181* 214* 129* 108* 40*  -Change SSI to very sensitive every 4 hours   ESRD on HD MWF-had HD on 9/27.  Was not able to do ultrafiltration due to hypotension -Per nephrology   Anemia of renal disease: Stable Generalized weakness/physical deconditioning: Likely due to COVID-19 -PT/OT   GERD:  -Continue PPI   History of CVA/HLD -Continue ASA, no statin on home med list   Thrombocytopenia: Chronic, stable, no bleeding.    Demand myocardial ischemia: Troponin flat and very minimally elevated in ESRD.  No chest pain or ischemic ECG changes.  Now with cardiac arrest. -Twelve-lead EKG and echocardiogram   Body mass index is 25.97 kg/m.          DVT prophylaxis:  heparin injection 5,000 Units Start: 03/07/2023 1700  Code Status: Full code Family Communication: None at bedside Level of care: ICU Status is: Observation The patient will require care spanning > 2 midnights and should be moved to inpatient because: Cardiac arrest, encephalopathy   Final disposition: To be determined Consultants:  None    Sch Meds:  Scheduled Meds:  aspirin EC  81 mg Oral Daily   Chlorhexidine Gluconate Cloth  6 each Topical Q0600   dextrose       EPINEPHrine       glucagon (human recombinant)       glucagon (human recombinant)       heparin  5,000 Units Subcutaneous Q8H   insulin aspart  0-5 Units Subcutaneous QHS   insulin aspart  0-9 Units Subcutaneous TID WC   linagliptin  5 mg Oral Daily   midodrine  10 mg Oral TID WC   multivitamin  1 tablet Oral QHS   pantoprazole  40 mg Oral Daily   predniSONE  40 mg Oral Q breakfast   sodium chloride flush  3 mL Intravenous Q12H   Continuous Infusions:  norepinephrine (LEVOPHED) Adult infusion     PRN Meds:.acetaminophen  **OR** acetaminophen, albuterol, dextrose, EPINEPHrine, glucagon (human recombinant), glucagon (human recombinant), guaiFENesin, pentafluoroprop-tetrafluoroeth  Antimicrobials: Anti-infectives (From admission, onward)    None        I have personally reviewed the following labs and images: CBC: Recent Labs  Lab 02/15/2023 1153 03/13/23 0503 10-Apr-2023 1051  WBC 13.5* 12.4* 15.3*  NEUTROABS 11.8*  --   --   HGB 12.0 10.3* 11.7*  HCT 36.0 29.5* 34.2*  MCV 94.0 91.6 93.2  PLT 109* 109* 103*   BMP &GFR Recent Labs  Lab 02/28/2023 1153 03/13/23 0503 04-10-2023 0526  NA 136 136 134*  K 3.9 4.1 4.3  CL 94* 95* 94*  CO2 27 25 25   GLUCOSE 221* 150* 110*  BUN 23 31* 22  CREATININE 3.70* 4.52* 3.03*  CALCIUM 7.8* 7.6* 7.8*  MG  --   --  1.8  PHOS  --   --  3.7   Estimated Creatinine Clearance: 14.9  mL/min (A) (by C-G formula based on SCr of 3.03 mg/dL (H)). Liver & Pancreas: Recent Labs  Lab 03/18/2023 1153 03/06/2023 0526  AST 29  --   ALT 16  --   ALKPHOS 66  --   BILITOT 1.6*  --   PROT 7.6  --   ALBUMIN 3.2* 3.2*   No results for input(s): "LIPASE", "AMYLASE" in the last 168 hours. Recent Labs  Lab 02/19/2023 1051  AMMONIA 28   Diabetic: No results for input(s): "HGBA1C" in the last 72 hours. Recent Labs  Lab 03/13/23 1128 03/13/23 1658 03/13/23 2110 03/03/2023 0736 03/04/2023 1126  GLUCAP 181* 214* 129* 108* 40*   Cardiac Enzymes: No results for input(s): "CKTOTAL", "CKMB", "CKMBINDEX", "TROPONINI" in the last 168 hours. No results for input(s): "PROBNP" in the last 8760 hours. Coagulation Profile: No results for input(s): "INR", "PROTIME" in the last 168 hours. Thyroid Function Tests: Recent Labs    03/01/2023 1051  TSH 2.672   Lipid Profile: No results for input(s): "CHOL", "HDL", "LDLCALC", "TRIG", "CHOLHDL", "LDLDIRECT" in the last 72 hours. Anemia Panel: No results for input(s): "VITAMINB12", "FOLATE", "FERRITIN", "TIBC", "IRON", "RETICCTPCT" in the  last 72 hours. Urine analysis:    Component Value Date/Time   COLORURINE YELLOW 10/15/2017 2244   APPEARANCEUR HAZY (A) 10/15/2017 2244   LABSPEC 1.015 10/15/2017 2244   PHURINE 7.0 10/15/2017 2244   GLUCOSEU >=500 (A) 10/15/2017 2244   HGBUR SMALL (A) 10/15/2017 2244   BILIRUBINUR NEGATIVE 10/15/2017 2244   KETONESUR NEGATIVE 10/15/2017 2244   PROTEINUR 100 (A) 10/15/2017 2244   UROBILINOGEN 0.2 11/19/2014 0909   NITRITE NEGATIVE 10/15/2017 2244   LEUKOCYTESUR MODERATE (A) 10/15/2017 2244   Sepsis Labs: Invalid input(s): "PROCALCITONIN", "LACTICIDVEN"  Microbiology: Recent Results (from the past 240 hour(s))  SARS Coronavirus 2 by RT PCR (hospital order, performed in [facility] hospital lab) *cepheid single result test* Anterior Nasal Swab     Status: Abnormal   Collection Time: 18-Mar-2023 12:45 PM   Specimen: Anterior Nasal Swab  Result Value Ref Range Status   SARS Coronavirus 2 by RT PCR POSITIVE (A) NEGATIVE Final    Comment: (NOTE) SARS-CoV-2 target nucleic acids are DETECTED  SARS-CoV-2 RNA is generally detectable in upper respiratory specimens  during the acute phase of infection.  Positive results are indicative  of the presence of the identified virus, but do not rule out bacterial infection or co-infection with other pathogens not detected by the test.  Clinical correlation with patient history and  other diagnostic information is necessary to determine patient infection status.  The expected result is negative.  Fact Sheet for Patients:   RoadLapTop.co.za   Fact Sheet for Healthcare Providers:   http://kim-miller.com/    This test is not yet approved or cleared by the [place] FDA and  has been authorized for detection and/or diagnosis of SARS-CoV-2 by FDA under an Emergency Use Authorization (EUA).  This EUA will remain in effect (meaning this test can be used) for the duration of  the COVID-19 declaration  under Section 564(b)(1)  of the Act, 21 U.S.C. section 360-bbb-3(b)(1), unless the authorization is terminated or revoked sooner.   Performed at [facility], [address], [place], [place] [address]     Radiology Studies: No results found.  CRITICAL CARE Performed by: Almon Hercules   Total critical care time: 75 minutes  Critical care time was exclusive of separately billable procedures and treating other patients.  Critical care was necessary to treat or  prevent imminent or life-threatening deterioration.  Critical care was time spent personally by me on the following activities: development of treatment plan with patient and/or surrogate as well as nursing, discussions with consultants, evaluation of patient's response to treatment, examination of patient, obtaining history from patient or surrogate, ordering and performing treatments and interventions, ordering and review of laboratory studies, ordering and review of radiographic studies, pulse oximetry and re-evaluation of patient's condition.    Avonelle Viveros T. Quintavia Rogstad [facility]  If 7PM-7AM, please contact night-coverage [email or web address] 2023/04/04, 12:00 PM

## 2023-03-17 NOTE — Progress Notes (Signed)
1125:Primary Nurse entered room to restart IV and noted that pt was unresponsive, no pulse. Code Blue called and chest compressions started. 1126: CBG 40 mg/dl, pt with no IV site at present. Multiple attempts to get access. IM glucagon administered.  1131: IV access #18g left upper chest obtained. Dextrose IV given. 1133: Epi given, compressions continue. IVF NS bolus started. 1135: Pt intubated by EDP, + color change on Co2 detector, + bilat breath sounds. Tube taped at 21cm. Repeat Epi admin'd. Daughter contacted via phone by MD Asencion Partridge with update on current pt status. 1137: Repeat CBG 154 mg/dl. Rhythm check shows organized rhythm on monitor but no palpable pulse (PEA). Compressions continue. Calcium chloride admin'd. Epi admin'd. Sodium Bicard admin'd. 1141: Rhythm check shows organized rhythm on monitor but no palpable pulse (PEA). Compressions continue. 1144: Rhythm check shows organized rhythm on monitor, palpable pulse noted both carotid and femorally. Epi admin'd. 1145: OG tube inserted with small amount fluid returned with suction. 1146: b/p 144/84, pulse palpable, HR 128 ST on monitor, SaO2 100% with continued bagged respirations. 1150: b/p 152/103, pulse palpable, HR 128 ST on monitor, pt with occasional gasping respirations against bagged respirations. No pupillary reaction noted.  1155: b/p 155/32, pulse palpable, HR 122 ST on monitor. Continues gasping resp against bagged respirations. 1156: #22G IV started in left forearm. 1157: Portable CXR completed. 1200: Pt transferred to ICU bed 9 via stretcher. Nurse to nurse report completed at bedside.

## 2023-03-17 DEATH — deceased

## 2023-03-19 ENCOUNTER — Encounter: Payer: 59 | Admitting: Physical Medicine & Rehabilitation

## 2023-04-30 ENCOUNTER — Ambulatory Visit (INDEPENDENT_AMBULATORY_CARE_PROVIDER_SITE_OTHER): Payer: 59 | Admitting: Gastroenterology

## 2023-06-23 ENCOUNTER — Ambulatory Visit: Payer: 59 | Admitting: "Endocrinology

## 2023-07-23 ENCOUNTER — Other Ambulatory Visit: Payer: 59

## 2023-07-30 ENCOUNTER — Inpatient Hospital Stay: Payer: 59 | Admitting: Oncology

## 2024-01-09 IMAGING — MR MR LUMBAR SPINE W/O CM
4 of 7 series · 19 of 48 positions shown · non-contrast
Comparison: CT 03/23/2020

CLINICAL DATA: Lumbar radiculopathy, no red flags, no prior
management

EXAM:
MRI LUMBAR SPINE WITHOUT CONTRAST
TECHNIQUE: Multiplanar, multisequence MR imaging of the lumbar spine was
performed. No intravenous contrast was administered.

[Series 7: T1 · sagittal · 4.0mm · 0.88mm/px · 3 of 16 slices shown (1 of 2)]
[im 1/16]
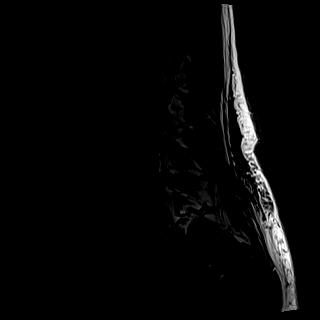
[im 8/16]
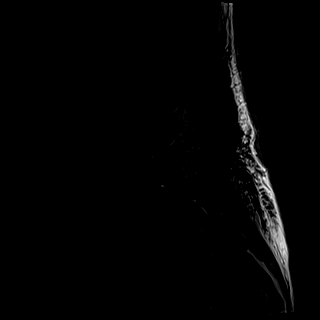
[im 16/16]
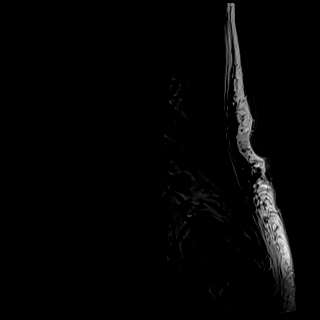

[Series 9: T2 · sagittal · 4.0mm · 0.88mm/px · 4 of 16 slices shown (1 of 2)]
[im 1/16]
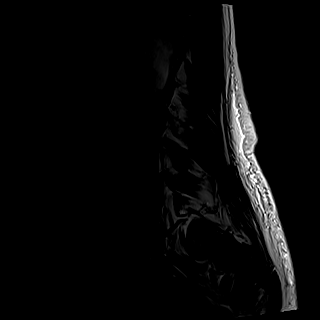
[im 6/16]
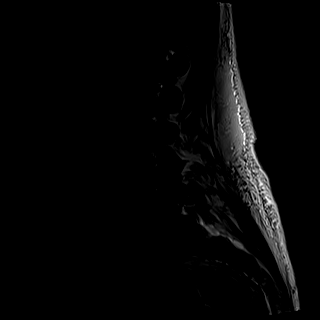
[im 11/16]
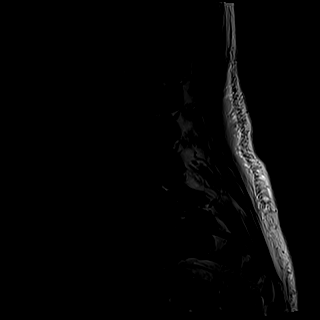
[im 16/16]
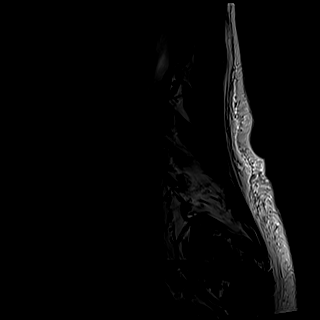

[Series 12: T2 · axial · 4.0mm · 0.35mm/px · z∈[+71,+324]mm · 9 of 47 slices shown (2 of 2)]
[im 1/47]
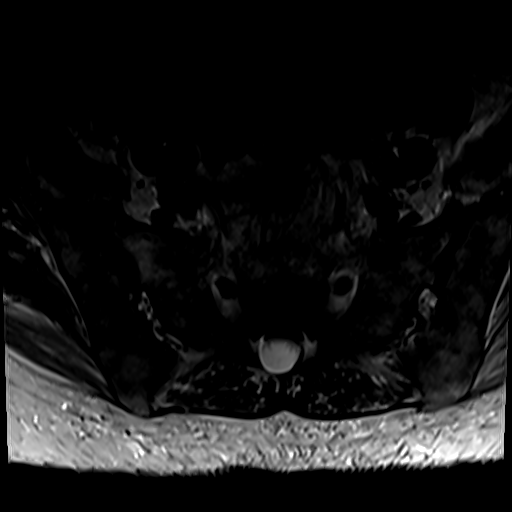
[im 8/47]
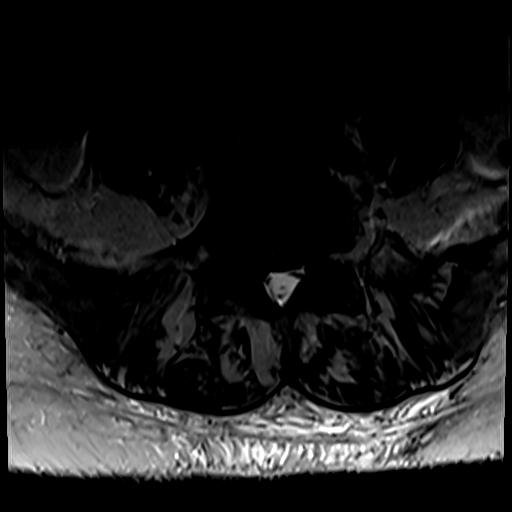
[im 16/47]
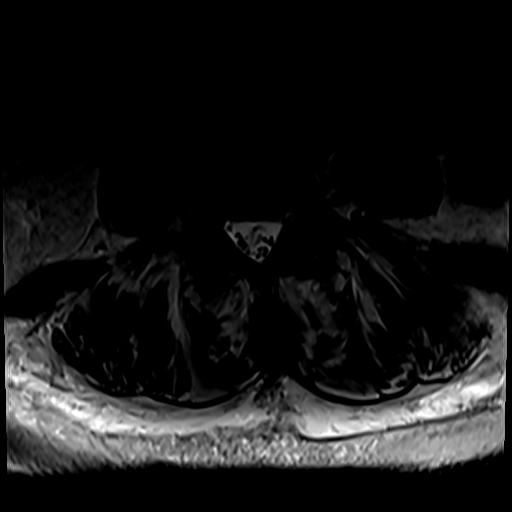
[im 20/47]
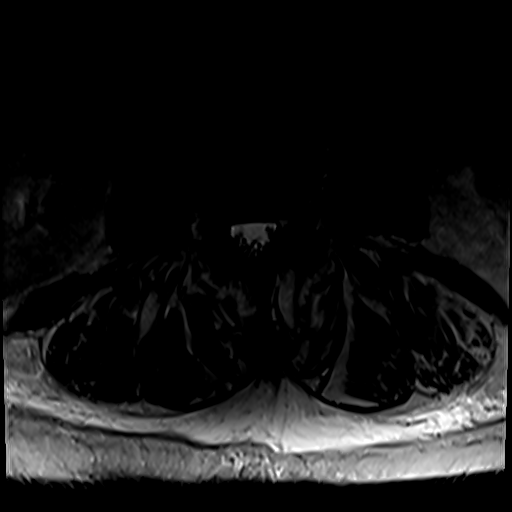
[im 24/47]
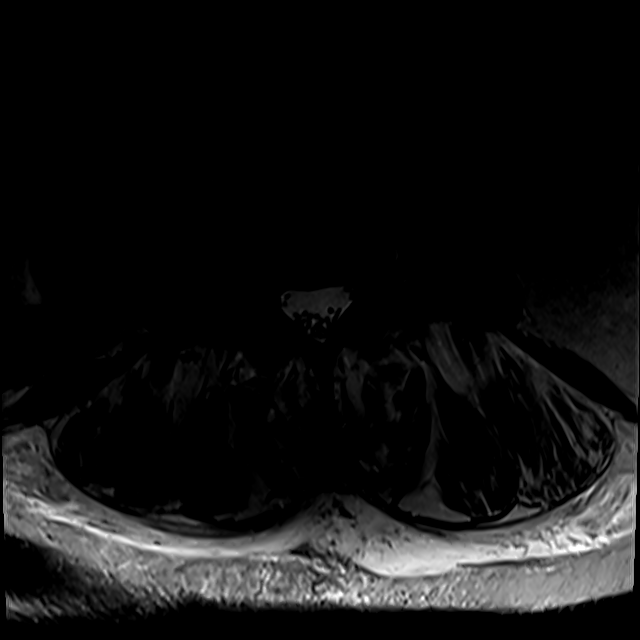
[im 27/47]
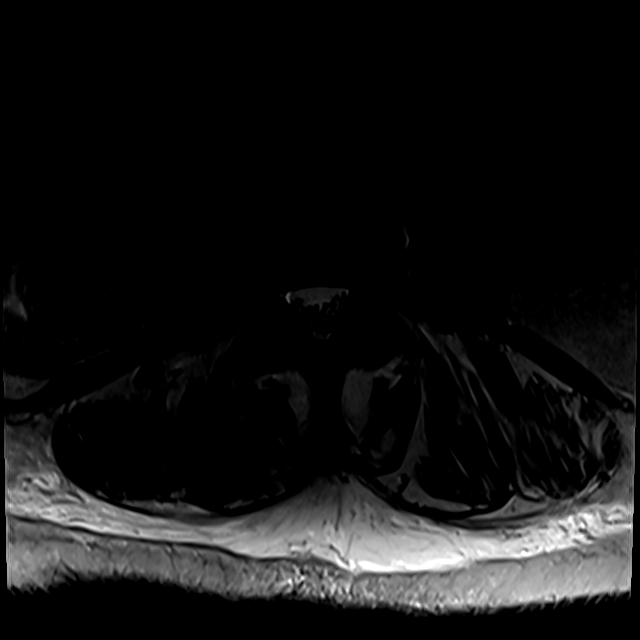
[im 31/47]
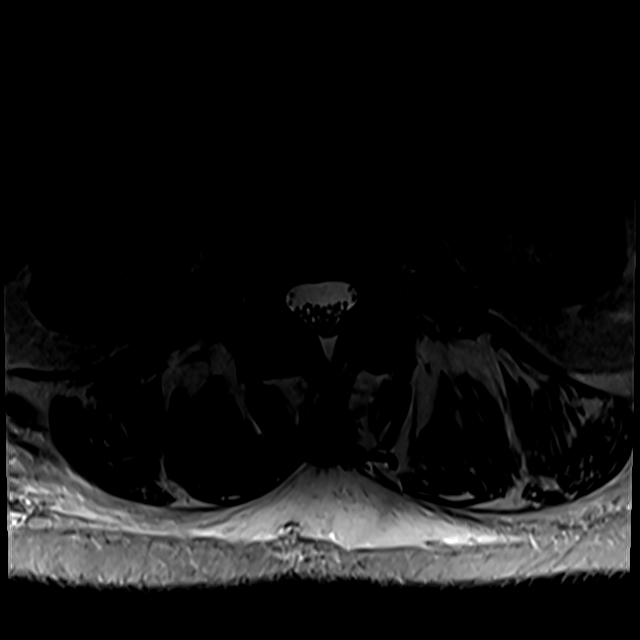
[im 39/47]
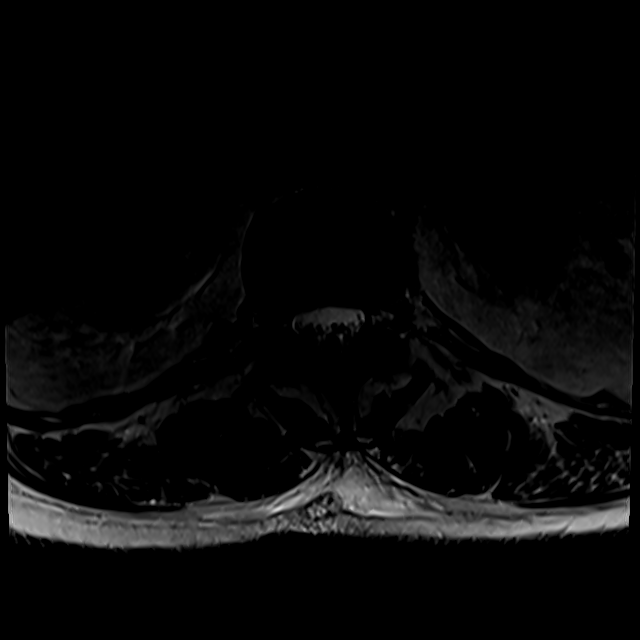
[im 47/47]
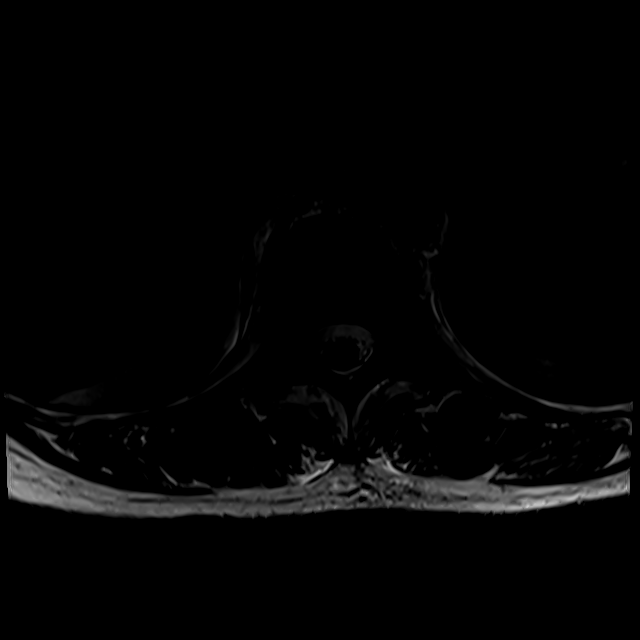

[Series 15: T1 · axial · 4.0mm · 0.28mm/px · z∈[+106,+285]mm · 3 of 47 slices shown (2 of 2)]
[im 8/47]
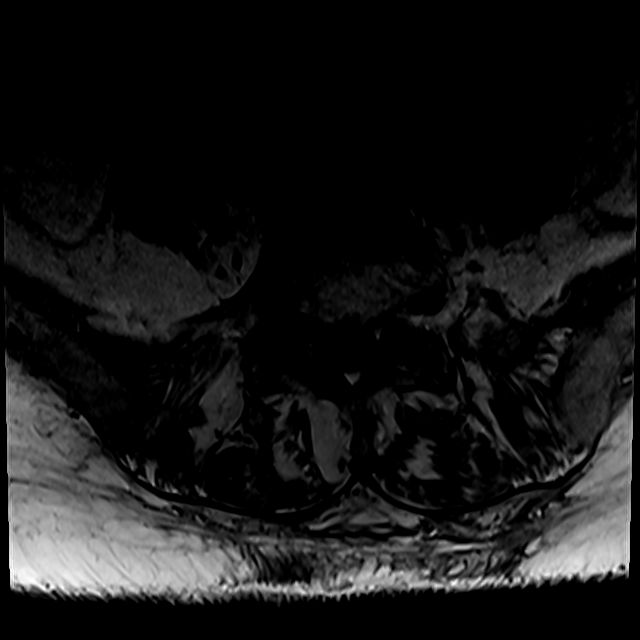
[im 24/47]
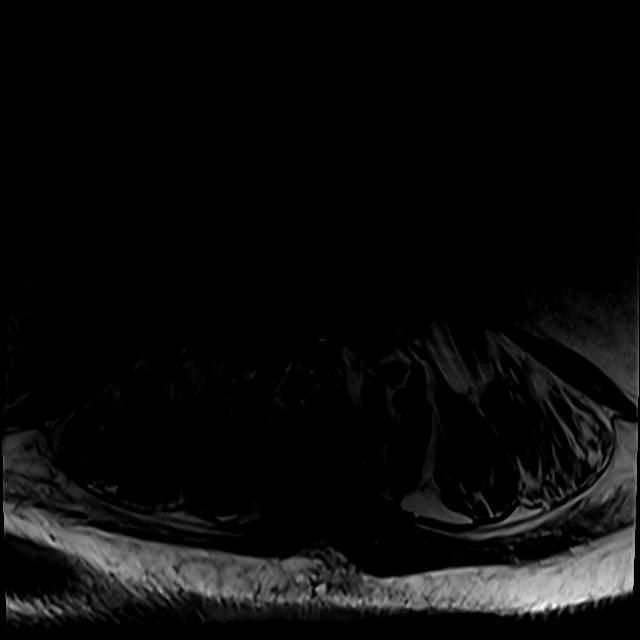
[im 39/47]
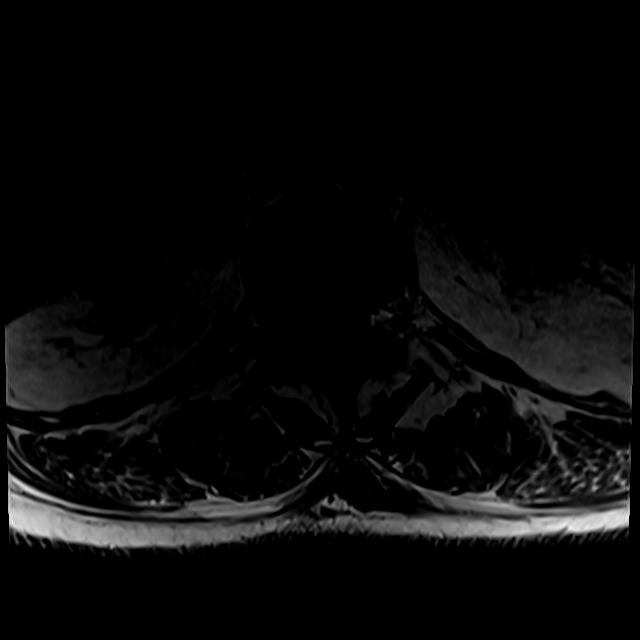

[19 of 48 positions shown; findings below may reference images not displayed]

FINDINGS: Segmentation:  Standard.

Alignment: Grade 1 anterolisthesis of L4 on L5. Slight lumbar
dextrocurvature.

Vertebrae: No fracture, evidence of discitis, or bone lesion.
Superior endplate Schmorl's node of L1.

Conus medullaris and cauda equina: Conus extends to the T12-L1
level. Conus appears normal. Slight bunching of the cauda equina
nerve roots above the L4-5 level of stenosis.

Paraspinal and other soft tissues: Colonic diverticulosis.
Cortically based T2 hyperintense lesionswithin the bilateral
kidneys, incompletely characterized, but most likely represent
cysts.

Disc levels:

T12-L1: Minimal disc bulge. Unremarkable facet joints. No foraminal
or canal stenosis.

L1-L2: Unremarkable disc. Minimal facet arthropathy. No foraminal or
canal stenosis.

L2-L3: Mild annular disc bulge. Mild bilateral facet arthropathy. No
canal stenosis. Borderline-mild right foraminal stenosis.

L3-L4: No disc protrusion. Mild bilateral facet arthropathy. No
canal stenosis. Borderline-mild bilateral foraminal stenosis.

L4-L5: Anterolisthesis with disc uncovering and diffuse disc bulge.
Severe bilateral facet arthropathy and ligamentum flavum buckling.
Findings result in severe canal stenosis with severe left and
moderate right foraminal stenosis.

L5-S1: Diffuse disc bulge, eccentric to the right with endplate
osteophytic ridging. Mild bilateral facet arthropathy. Moderate
right and mild left foraminal stenosis. No canal stenosis.
IMPRESSION: 1. Multilevel lumbar spondylosis as described above, most pronounced
at L4-5 where there is severe canal stenosis with severe left and
moderate right foraminal stenosis.
2. Moderate right and mild left foraminal stenosis at L5-S1.
